# Patient Record
Sex: Female | Born: 1950
Health system: Southern US, Community
[De-identification: ages and names within clinical notes are randomized; demographics above are authoritative.]

## PROBLEM LIST (undated history)

## (undated) DIAGNOSIS — C9000 Multiple myeloma not having achieved remission: Secondary | ICD-10-CM

## (undated) DIAGNOSIS — K219 Gastro-esophageal reflux disease without esophagitis: Secondary | ICD-10-CM

## (undated) DIAGNOSIS — I10 Essential (primary) hypertension: Secondary | ICD-10-CM

## (undated) DIAGNOSIS — Z8489 Family history of other specified conditions: Secondary | ICD-10-CM

## (undated) DIAGNOSIS — Z9889 Other specified postprocedural states: Secondary | ICD-10-CM

## (undated) DIAGNOSIS — Z9484 Stem cells transplant status: Secondary | ICD-10-CM

## (undated) DIAGNOSIS — Z87898 Personal history of other specified conditions: Secondary | ICD-10-CM

## (undated) DIAGNOSIS — C541 Malignant neoplasm of endometrium: Secondary | ICD-10-CM

## (undated) DIAGNOSIS — Z9221 Personal history of antineoplastic chemotherapy: Secondary | ICD-10-CM

## (undated) DIAGNOSIS — Z923 Personal history of irradiation: Secondary | ICD-10-CM

## (undated) DIAGNOSIS — R112 Nausea with vomiting, unspecified: Secondary | ICD-10-CM

## (undated) DIAGNOSIS — M87059 Idiopathic aseptic necrosis of unspecified femur: Secondary | ICD-10-CM

## (undated) DIAGNOSIS — Z973 Presence of spectacles and contact lenses: Secondary | ICD-10-CM

## (undated) HISTORY — DX: Essential (primary) hypertension: I10

## (undated) HISTORY — DX: Personal history of irradiation: Z92.3

## (undated) HISTORY — DX: Multiple myeloma not having achieved remission: C90.00

---

## 1984-01-31 HISTORY — PX: TUBAL LIGATION: SHX77

## 1990-01-30 HISTORY — PX: ECTOPIC PREGNANCY SURGERY: SHX613

## 1997-05-31 ENCOUNTER — Emergency Department (HOSPITAL_COMMUNITY): Admission: EM | Admit: 1997-05-31 | Discharge: 1997-05-31 | Payer: Self-pay | Admitting: Emergency Medicine

## 1997-08-14 ENCOUNTER — Ambulatory Visit: Admission: RE | Admit: 1997-08-14 | Discharge: 1997-08-14 | Payer: Self-pay | Admitting: Obstetrics and Gynecology

## 1997-08-17 ENCOUNTER — Ambulatory Visit (HOSPITAL_COMMUNITY): Admission: RE | Admit: 1997-08-17 | Discharge: 1997-08-17 | Payer: Self-pay | Admitting: Obstetrics and Gynecology

## 1997-08-24 ENCOUNTER — Ambulatory Visit (HOSPITAL_COMMUNITY): Admission: RE | Admit: 1997-08-24 | Discharge: 1997-08-24 | Payer: Self-pay | Admitting: Hematology & Oncology

## 1997-09-07 ENCOUNTER — Ambulatory Visit (HOSPITAL_COMMUNITY): Admission: RE | Admit: 1997-09-07 | Discharge: 1997-09-07 | Payer: Self-pay | Admitting: Hematology & Oncology

## 1997-10-20 ENCOUNTER — Encounter: Payer: Self-pay | Admitting: Oncology

## 1997-10-20 ENCOUNTER — Inpatient Hospital Stay (HOSPITAL_COMMUNITY): Admission: AD | Admit: 1997-10-20 | Discharge: 1997-10-24 | Payer: Self-pay | Admitting: Oncology

## 1997-11-27 ENCOUNTER — Emergency Department (HOSPITAL_COMMUNITY): Admission: EM | Admit: 1997-11-27 | Discharge: 1997-11-27 | Payer: Self-pay | Admitting: Emergency Medicine

## 1997-11-28 ENCOUNTER — Inpatient Hospital Stay (HOSPITAL_COMMUNITY): Admission: AD | Admit: 1997-11-28 | Discharge: 1997-12-02 | Payer: Self-pay | Admitting: Oncology

## 1997-11-28 DIAGNOSIS — I10 Essential (primary) hypertension: Secondary | ICD-10-CM | POA: Insufficient documentation

## 1997-11-28 DIAGNOSIS — B9561 Methicillin susceptible Staphylococcus aureus infection as the cause of diseases classified elsewhere: Secondary | ICD-10-CM | POA: Insufficient documentation

## 1997-11-28 DIAGNOSIS — Z8579 Personal history of other malignant neoplasms of lymphoid, hematopoietic and related tissues: Secondary | ICD-10-CM | POA: Insufficient documentation

## 1997-11-28 DIAGNOSIS — B957 Other staphylococcus as the cause of diseases classified elsewhere: Secondary | ICD-10-CM | POA: Insufficient documentation

## 1997-11-28 HISTORY — DX: Essential (primary) hypertension: I10

## 1997-12-09 ENCOUNTER — Ambulatory Visit (HOSPITAL_COMMUNITY): Admission: RE | Admit: 1997-12-09 | Discharge: 1997-12-09 | Payer: Self-pay | Admitting: Hematology & Oncology

## 1998-01-12 ENCOUNTER — Ambulatory Visit (HOSPITAL_COMMUNITY): Admission: RE | Admit: 1998-01-12 | Discharge: 1998-01-12 | Payer: Self-pay | Admitting: Hematology & Oncology

## 1998-01-15 ENCOUNTER — Encounter: Payer: Self-pay | Admitting: Hematology & Oncology

## 1998-01-15 ENCOUNTER — Ambulatory Visit (HOSPITAL_COMMUNITY): Admission: RE | Admit: 1998-01-15 | Discharge: 1998-01-15 | Payer: Self-pay | Admitting: Hematology & Oncology

## 1998-01-18 ENCOUNTER — Ambulatory Visit: Admission: RE | Admit: 1998-01-18 | Discharge: 1998-01-18 | Payer: Self-pay | Admitting: Hematology & Oncology

## 1999-02-01 ENCOUNTER — Ambulatory Visit (HOSPITAL_COMMUNITY): Admission: RE | Admit: 1999-02-01 | Discharge: 1999-02-01 | Payer: Self-pay | Admitting: Internal Medicine

## 1999-02-01 ENCOUNTER — Encounter: Payer: Self-pay | Admitting: Internal Medicine

## 1999-10-06 ENCOUNTER — Other Ambulatory Visit: Admission: RE | Admit: 1999-10-06 | Discharge: 1999-10-06 | Payer: Self-pay | Admitting: Obstetrics and Gynecology

## 1999-10-14 ENCOUNTER — Ambulatory Visit (HOSPITAL_COMMUNITY): Admission: RE | Admit: 1999-10-14 | Discharge: 1999-10-14 | Payer: Self-pay | Admitting: Obstetrics and Gynecology

## 1999-10-14 ENCOUNTER — Encounter: Payer: Self-pay | Admitting: Obstetrics and Gynecology

## 2000-01-02 ENCOUNTER — Ambulatory Visit (HOSPITAL_COMMUNITY): Admission: RE | Admit: 2000-01-02 | Discharge: 2000-01-02 | Payer: Self-pay | Admitting: Gastroenterology

## 2001-02-01 ENCOUNTER — Other Ambulatory Visit: Admission: RE | Admit: 2001-02-01 | Discharge: 2001-02-01 | Payer: Self-pay | Admitting: Obstetrics and Gynecology

## 2001-02-15 ENCOUNTER — Encounter: Payer: Self-pay | Admitting: Obstetrics and Gynecology

## 2001-02-15 ENCOUNTER — Ambulatory Visit (HOSPITAL_COMMUNITY): Admission: RE | Admit: 2001-02-15 | Discharge: 2001-02-15 | Payer: Self-pay | Admitting: Obstetrics and Gynecology

## 2001-05-30 ENCOUNTER — Ambulatory Visit (HOSPITAL_COMMUNITY): Admission: RE | Admit: 2001-05-30 | Discharge: 2001-05-30 | Payer: Self-pay | Admitting: Hematology & Oncology

## 2001-05-30 ENCOUNTER — Encounter: Payer: Self-pay | Admitting: Hematology & Oncology

## 2003-11-13 ENCOUNTER — Ambulatory Visit: Payer: Self-pay | Admitting: Infectious Diseases

## 2003-11-17 ENCOUNTER — Ambulatory Visit (HOSPITAL_COMMUNITY): Admission: RE | Admit: 2003-11-17 | Discharge: 2003-11-17 | Payer: Self-pay | Admitting: Infectious Diseases

## 2003-12-14 ENCOUNTER — Ambulatory Visit: Payer: Self-pay | Admitting: Infectious Diseases

## 2004-01-13 ENCOUNTER — Ambulatory Visit: Payer: Self-pay | Admitting: Infectious Diseases

## 2004-02-17 ENCOUNTER — Ambulatory Visit: Payer: Self-pay | Admitting: Infectious Diseases

## 2004-03-30 ENCOUNTER — Ambulatory Visit: Payer: Self-pay | Admitting: Infectious Diseases

## 2004-03-31 ENCOUNTER — Ambulatory Visit: Payer: Self-pay | Admitting: Hematology & Oncology

## 2004-09-27 ENCOUNTER — Ambulatory Visit: Payer: Self-pay | Admitting: Hematology & Oncology

## 2005-04-10 ENCOUNTER — Ambulatory Visit: Payer: Self-pay | Admitting: Hematology & Oncology

## 2005-04-25 ENCOUNTER — Ambulatory Visit (HOSPITAL_COMMUNITY): Admission: RE | Admit: 2005-04-25 | Discharge: 2005-04-25 | Payer: Self-pay | Admitting: Hematology & Oncology

## 2005-04-25 ENCOUNTER — Encounter (INDEPENDENT_AMBULATORY_CARE_PROVIDER_SITE_OTHER): Payer: Self-pay | Admitting: Specialist

## 2005-06-06 ENCOUNTER — Ambulatory Visit: Payer: Self-pay | Admitting: Hematology & Oncology

## 2005-06-08 LAB — CBC WITH DIFFERENTIAL/PLATELET
BASO%: 1.7 % (ref 0.0–2.0)
Basophils Absolute: 0.1 10*3/uL (ref 0.0–0.1)
EOS%: 3.8 % (ref 0.0–7.0)
Eosinophils Absolute: 0.2 10*3/uL (ref 0.0–0.5)
HCT: 35.3 % (ref 34.8–46.6)
HGB: 11.4 g/dL — ABNORMAL LOW (ref 11.6–15.9)
LYMPH%: 41.4 % (ref 14.0–48.0)
MCH: 24.7 pg — ABNORMAL LOW (ref 26.0–34.0)
MCHC: 32.3 g/dL (ref 32.0–36.0)
MCV: 76.3 fL — ABNORMAL LOW (ref 81.0–101.0)
MONO#: 0.6 10*3/uL (ref 0.1–0.9)
MONO%: 11.8 % (ref 0.0–13.0)
NEUT#: 2.1 10*3/uL (ref 1.5–6.5)
NEUT%: 41.3 % (ref 39.6–76.8)
Platelets: 360 10*3/uL (ref 145–400)
RBC: 4.63 10*6/uL (ref 3.70–5.32)
RDW: 16 % — ABNORMAL HIGH (ref 11.3–14.5)
WBC: 5.1 10*3/uL (ref 3.9–10.0)
lymph#: 2.1 10*3/uL (ref 0.9–3.3)

## 2005-06-12 LAB — PROTEIN ELECTROPHORESIS, SERUM
Albumin ELP: 63 % (ref 55.8–66.1)
Alpha-1-Globulin: 3.8 % (ref 2.9–4.9)
Alpha-2-Globulin: 11.3 % (ref 7.1–11.8)
Beta 2: 5.5 % (ref 3.2–6.5)
Beta Globulin: 6.2 % (ref 4.7–7.2)
Gamma Globulin: 10.2 % — ABNORMAL LOW (ref 11.1–18.8)
Total Protein, Serum Electrophoresis: 7.1 g/dL (ref 6.0–8.3)

## 2005-06-12 LAB — KAPPA/LAMBDA LIGHT CHAINS
Kappa free light chain: 0.87 mg/dL (ref 0.33–1.94)
Kappa:Lambda Ratio: 0.04 — ABNORMAL LOW (ref 0.26–1.65)
Lambda Free Lght Chn: 23.4 mg/dL — ABNORMAL HIGH (ref 0.57–2.63)

## 2005-06-12 LAB — COMPREHENSIVE METABOLIC PANEL
ALT: 17 U/L (ref 0–40)
AST: 16 U/L (ref 0–37)
Albumin: 4.4 g/dL (ref 3.5–5.2)
Alkaline Phosphatase: 70 U/L (ref 39–117)
BUN: 23 mg/dL (ref 6–23)
CO2: 25 mEq/L (ref 19–32)
Calcium: 9.5 mg/dL (ref 8.4–10.5)
Chloride: 103 mEq/L (ref 96–112)
Creatinine, Ser: 1.1 mg/dL (ref 0.4–1.2)
Glucose, Bld: 98 mg/dL (ref 70–99)
Potassium: 3.9 mEq/L (ref 3.5–5.3)
Sodium: 140 mEq/L (ref 135–145)
Total Bilirubin: 0.3 mg/dL (ref 0.3–1.2)
Total Protein: 7.1 g/dL (ref 6.0–8.3)

## 2005-06-12 LAB — LACTATE DEHYDROGENASE: LDH: 153 U/L (ref 94–250)

## 2005-07-17 ENCOUNTER — Ambulatory Visit: Payer: Self-pay | Admitting: Hematology & Oncology

## 2005-07-20 LAB — UIFE/LIGHT CHAINS/TP QN, 24-HR UR
Albumin, U: DETECTED
Alpha 1, Urine: DETECTED — AB
Alpha 2, Urine: DETECTED — AB
Beta, Urine: DETECTED — AB
Free Kappa Lt Chains,Ur: 3.49 mg/dL — ABNORMAL HIGH (ref 0.04–1.51)
Free Kappa/Lambda Ratio: 0.02 ratio — ABNORMAL LOW (ref 0.46–4.00)
Free Lambda Excretion/Day: 2996 mg/d
Free Lambda Lt Chains,Ur: 214 mg/dL — ABNORMAL HIGH (ref 0.08–1.01)
Free Lt Chn Excr Rate: 48.86 mg/d
Gamma Globulin, Urine: DETECTED — AB
Time: 24 hours
Total Protein, Urine-Ur/day: 3070 mg/d — ABNORMAL HIGH (ref 10–140)
Total Protein, Urine: 219.3 mg/dL
Volume, Urine: 1400 mL

## 2005-07-24 LAB — CBC WITH DIFFERENTIAL/PLATELET
BASO%: 3 % — ABNORMAL HIGH (ref 0.0–2.0)
Basophils Absolute: 0.2 10*3/uL — ABNORMAL HIGH (ref 0.0–0.1)
EOS%: 4.9 % (ref 0.0–7.0)
Eosinophils Absolute: 0.3 10*3/uL (ref 0.0–0.5)
HCT: 36.7 % (ref 34.8–46.6)
HGB: 11.9 g/dL (ref 11.6–15.9)
LYMPH%: 40.1 % (ref 14.0–48.0)
MCH: 24.9 pg — ABNORMAL LOW (ref 26.0–34.0)
MCHC: 32.6 g/dL (ref 32.0–36.0)
MCV: 76.4 fL — ABNORMAL LOW (ref 81.0–101.0)
MONO#: 0.7 10*3/uL (ref 0.1–0.9)
MONO%: 13 % (ref 0.0–13.0)
NEUT#: 2.2 10*3/uL (ref 1.5–6.5)
NEUT%: 39 % — ABNORMAL LOW (ref 39.6–76.8)
Platelets: 402 10*3/uL — ABNORMAL HIGH (ref 145–400)
RBC: 4.8 10*6/uL (ref 3.70–5.32)
RDW: 17.4 % — ABNORMAL HIGH (ref 11.3–14.5)
WBC: 5.5 10*3/uL (ref 3.9–10.0)
lymph#: 2.2 10*3/uL (ref 0.9–3.3)

## 2005-07-26 LAB — COMPREHENSIVE METABOLIC PANEL
ALT: 24 U/L (ref 0–40)
AST: 18 U/L (ref 0–37)
Albumin: 4.8 g/dL (ref 3.5–5.2)
Alkaline Phosphatase: 69 U/L (ref 39–117)
BUN: 18 mg/dL (ref 6–23)
CO2: 27 mEq/L (ref 19–32)
Calcium: 9.6 mg/dL (ref 8.4–10.5)
Chloride: 102 mEq/L (ref 96–112)
Creatinine, Ser: 1 mg/dL (ref 0.40–1.20)
Glucose, Bld: 98 mg/dL (ref 70–99)
Potassium: 4.2 mEq/L (ref 3.5–5.3)
Sodium: 139 mEq/L (ref 135–145)
Total Bilirubin: 0.3 mg/dL (ref 0.3–1.2)
Total Protein: 7.6 g/dL (ref 6.0–8.3)

## 2005-07-26 LAB — PROTEIN ELECTROPHORESIS, SERUM
Albumin ELP: 59.6 % (ref 55.8–66.1)
Alpha-1-Globulin: 4 % (ref 2.9–4.9)
Alpha-2-Globulin: 11.8 % (ref 7.1–11.8)
Beta 2: 6.2 % (ref 3.2–6.5)
Beta Globulin: 6.8 % (ref 4.7–7.2)
Gamma Globulin: 11.6 % (ref 11.1–18.8)
Total Protein, Serum Electrophoresis: 7.6 g/dL (ref 6.0–8.3)

## 2005-07-26 LAB — BETA 2 MICROGLOBULIN, SERUM: Beta-2 Microglobulin: 2.02 mg/L — ABNORMAL HIGH (ref 1.01–1.73)

## 2005-08-15 LAB — URINALYSIS, MICROSCOPIC - CHCC
Bilirubin (Urine): NEGATIVE
Blood: NEGATIVE
Glucose: NEGATIVE g/dL
Ketones: NEGATIVE mg/dL
Nitrite: NEGATIVE
Protein: NEGATIVE mg/dL
RBC count: NEGATIVE (ref 0–2)
Specific Gravity, Urine: 1.03 (ref 1.003–1.035)
pH: 5 (ref 4.6–8.0)

## 2005-08-17 LAB — URINE CULTURE

## 2005-08-18 LAB — URINALYSIS, MICROSCOPIC - CHCC
Bilirubin (Urine): NEGATIVE
Glucose: NEGATIVE g/dL
Ketones: NEGATIVE mg/dL
Leukocyte Esterase: NEGATIVE
Nitrite: NEGATIVE
Protein: NEGATIVE mg/dL
Specific Gravity, Urine: 1.02 (ref 1.003–1.035)
WBC, UA: NEGATIVE (ref 0–2)
pH: 6 (ref 4.6–8.0)

## 2005-08-18 LAB — COMPREHENSIVE METABOLIC PANEL
ALT: 20 U/L (ref 0–40)
AST: 11 U/L (ref 0–37)
Albumin: 4.2 g/dL (ref 3.5–5.2)
Alkaline Phosphatase: 70 U/L (ref 39–117)
BUN: 18 mg/dL (ref 6–23)
CO2: 23 mEq/L (ref 19–32)
Calcium: 8.8 mg/dL (ref 8.4–10.5)
Chloride: 103 mEq/L (ref 96–112)
Creatinine, Ser: 0.93 mg/dL (ref 0.40–1.20)
Glucose, Bld: 87 mg/dL (ref 70–99)
Potassium: 3.8 mEq/L (ref 3.5–5.3)
Sodium: 139 mEq/L (ref 135–145)
Total Bilirubin: 0.3 mg/dL (ref 0.3–1.2)
Total Protein: 6.4 g/dL (ref 6.0–8.3)

## 2005-08-18 LAB — CBC WITH DIFFERENTIAL/PLATELET
BASO%: 0.4 % (ref 0.0–2.0)
Basophils Absolute: 0 10*3/uL (ref 0.0–0.1)
EOS%: 4.6 % (ref 0.0–7.0)
Eosinophils Absolute: 0.2 10*3/uL (ref 0.0–0.5)
HCT: 34.2 % — ABNORMAL LOW (ref 34.8–46.6)
HGB: 11.2 g/dL — ABNORMAL LOW (ref 11.6–15.9)
LYMPH%: 29.9 % (ref 14.0–48.0)
MCH: 25.6 pg — ABNORMAL LOW (ref 26.0–34.0)
MCHC: 32.9 g/dL (ref 32.0–36.0)
MCV: 77.9 fL — ABNORMAL LOW (ref 81.0–101.0)
MONO#: 1 10*3/uL — ABNORMAL HIGH (ref 0.1–0.9)
MONO%: 18 % — ABNORMAL HIGH (ref 0.0–13.0)
NEUT#: 2.6 10*3/uL (ref 1.5–6.5)
NEUT%: 47.1 % (ref 39.6–76.8)
Platelets: 314 10*3/uL (ref 145–400)
RBC: 4.39 10*6/uL (ref 3.70–5.32)
RDW: 21.2 % — ABNORMAL HIGH (ref 11.3–14.5)
WBC: 5.4 10*3/uL (ref 3.9–10.0)
lymph#: 1.6 10*3/uL (ref 0.9–3.3)

## 2005-09-15 ENCOUNTER — Ambulatory Visit: Payer: Self-pay | Admitting: Hematology & Oncology

## 2005-09-21 LAB — UIFE/LIGHT CHAINS/TP QN, 24-HR UR
Albumin, U: DETECTED
Alpha 1, Urine: DETECTED — AB
Alpha 2, Urine: DETECTED — AB
Beta, Urine: DETECTED — AB
Free Kappa Lt Chains,Ur: 1.99 mg/dL — ABNORMAL HIGH (ref 0.04–1.51)
Free Kappa/Lambda Ratio: 0.25 ratio — ABNORMAL LOW (ref 0.46–4.00)
Free Lambda Excretion/Day: 103.09 mg/d
Free Lambda Lt Chains,Ur: 7.93 mg/dL — ABNORMAL HIGH (ref 0.08–1.01)
Free Lt Chn Excr Rate: 25.87 mg/d
Gamma Globulin, Urine: DETECTED — AB
Time: 24 hours
Total Protein, Urine-Ur/day: 143 mg/d — ABNORMAL HIGH (ref 10–140)
Total Protein, Urine: 11 mg/dL
Volume, Urine: 1300 mL

## 2005-09-29 LAB — CBC WITH DIFFERENTIAL/PLATELET
BASO%: 0.1 % (ref 0.0–2.0)
Basophils Absolute: 0 10*3/uL (ref 0.0–0.1)
EOS%: 3.2 % (ref 0.0–7.0)
Eosinophils Absolute: 0.2 10*3/uL (ref 0.0–0.5)
HCT: 34 % — ABNORMAL LOW (ref 34.8–46.6)
HGB: 11.2 g/dL — ABNORMAL LOW (ref 11.6–15.9)
LYMPH%: 22.8 % (ref 14.0–48.0)
MCH: 26.7 pg (ref 26.0–34.0)
MCHC: 33.1 g/dL (ref 32.0–36.0)
MCV: 80.7 fL — ABNORMAL LOW (ref 81.0–101.0)
MONO#: 0.9 10*3/uL (ref 0.1–0.9)
MONO%: 16.5 % — ABNORMAL HIGH (ref 0.0–13.0)
NEUT#: 3.2 10*3/uL (ref 1.5–6.5)
NEUT%: 57.4 % (ref 39.6–76.8)
Platelets: 309 10*3/uL (ref 145–400)
RBC: 4.21 10*6/uL (ref 3.70–5.32)
RDW: 23 % — ABNORMAL HIGH (ref 11.3–14.5)
WBC: 5.6 10*3/uL (ref 3.9–10.0)
lymph#: 1.3 10*3/uL (ref 0.9–3.3)

## 2005-10-02 LAB — COMPREHENSIVE METABOLIC PANEL
ALT: 18 U/L (ref 0–40)
AST: 10 U/L (ref 0–37)
Albumin: 4.2 g/dL (ref 3.5–5.2)
Alkaline Phosphatase: 42 U/L (ref 39–117)
BUN: 26 mg/dL — ABNORMAL HIGH (ref 6–23)
CO2: 26 mEq/L (ref 19–32)
Calcium: 9.7 mg/dL (ref 8.4–10.5)
Chloride: 106 mEq/L (ref 96–112)
Creatinine, Ser: 0.89 mg/dL (ref 0.40–1.20)
Glucose, Bld: 92 mg/dL (ref 70–99)
Potassium: 3.7 mEq/L (ref 3.5–5.3)
Sodium: 143 mEq/L (ref 135–145)
Total Bilirubin: 0.4 mg/dL (ref 0.3–1.2)
Total Protein: 6.4 g/dL (ref 6.0–8.3)

## 2005-10-02 LAB — KAPPA/LAMBDA LIGHT CHAINS
Kappa free light chain: 0.92 mg/dL (ref 0.33–1.94)
Kappa:Lambda Ratio: 0.56 (ref 0.26–1.65)
Lambda Free Lght Chn: 1.63 mg/dL (ref 0.57–2.63)

## 2005-10-02 LAB — LACTATE DEHYDROGENASE: LDH: 192 U/L (ref 94–250)

## 2005-11-20 ENCOUNTER — Ambulatory Visit: Payer: Self-pay | Admitting: Hematology & Oncology

## 2005-12-18 ENCOUNTER — Other Ambulatory Visit: Admission: RE | Admit: 2005-12-18 | Discharge: 2005-12-18 | Payer: Self-pay | Admitting: Obstetrics and Gynecology

## 2005-12-18 DIAGNOSIS — R638 Other symptoms and signs concerning food and fluid intake: Secondary | ICD-10-CM | POA: Insufficient documentation

## 2006-01-16 ENCOUNTER — Ambulatory Visit: Payer: Self-pay | Admitting: Hematology & Oncology

## 2006-02-01 LAB — UIFE/LIGHT CHAINS/TP QN, 24-HR UR
Albumin, U: DETECTED
Alpha 1, Urine: DETECTED — AB
Alpha 2, Urine: DETECTED — AB
Beta, Urine: DETECTED — AB
Free Kappa Lt Chains,Ur: 1.89 mg/dL — ABNORMAL HIGH (ref 0.04–1.51)
Free Kappa/Lambda Ratio: 1.31 ratio (ref 0.46–4.00)
Free Lambda Excretion/Day: 23.04 mg/d
Free Lambda Lt Chains,Ur: 1.44 mg/dL — ABNORMAL HIGH (ref 0.08–1.01)
Free Lt Chn Excr Rate: 30.24 mg/d
Gamma Globulin, Urine: DETECTED — AB
Time: 24 hours
Total Protein, Urine-Ur/day: 69 mg/d (ref 10–140)
Total Protein, Urine: 4.3 mg/dL
Volume, Urine: 1600 mL

## 2006-02-01 LAB — CBC WITH DIFFERENTIAL/PLATELET
BASO%: 0.9 % (ref 0.0–2.0)
Basophils Absolute: 0.1 10*3/uL (ref 0.0–0.1)
EOS%: 1.9 % (ref 0.0–7.0)
Eosinophils Absolute: 0.1 10*3/uL (ref 0.0–0.5)
HCT: 35.7 % (ref 34.8–46.6)
HGB: 11.9 g/dL (ref 11.6–15.9)
LYMPH%: 24.4 % (ref 14.0–48.0)
MCH: 27.4 pg (ref 26.0–34.0)
MCHC: 33.2 g/dL (ref 32.0–36.0)
MCV: 82.7 fL (ref 81.0–101.0)
MONO#: 0.6 10*3/uL (ref 0.1–0.9)
MONO%: 9.2 % (ref 0.0–13.0)
NEUT#: 4.1 10*3/uL (ref 1.5–6.5)
NEUT%: 63.6 % (ref 39.6–76.8)
Platelets: 305 10*3/uL (ref 145–400)
RBC: 4.32 10*6/uL (ref 3.70–5.32)
RDW: 16.5 % — ABNORMAL HIGH (ref 11.3–14.5)
WBC: 6.4 10*3/uL (ref 3.9–10.0)
lymph#: 1.6 10*3/uL (ref 0.9–3.3)

## 2006-02-05 ENCOUNTER — Ambulatory Visit: Payer: Self-pay | Admitting: Hematology & Oncology

## 2006-02-05 LAB — COMPREHENSIVE METABOLIC PANEL
ALT: 21 U/L (ref 0–35)
AST: 12 U/L (ref 0–37)
Albumin: 4.4 g/dL (ref 3.5–5.2)
Alkaline Phosphatase: 40 U/L (ref 39–117)
BUN: 20 mg/dL (ref 6–23)
CO2: 28 mEq/L (ref 19–32)
Calcium: 9.8 mg/dL (ref 8.4–10.5)
Chloride: 99 mEq/L (ref 96–112)
Creatinine, Ser: 1.26 mg/dL — ABNORMAL HIGH (ref 0.40–1.20)
Glucose, Bld: 91 mg/dL (ref 70–99)
Potassium: 3.1 mEq/L — ABNORMAL LOW (ref 3.5–5.3)
Sodium: 143 mEq/L (ref 135–145)
Total Bilirubin: 0.4 mg/dL (ref 0.3–1.2)
Total Protein: 6.5 g/dL (ref 6.0–8.3)

## 2006-02-05 LAB — PROTEIN ELECTROPHORESIS, SERUM
Albumin ELP: 62.6 % (ref 55.8–66.1)
Alpha-1-Globulin: 3.8 % (ref 2.9–4.9)
Alpha-2-Globulin: 12.5 % — ABNORMAL HIGH (ref 7.1–11.8)
Beta 2: 5.1 % (ref 3.2–6.5)
Beta Globulin: 6.9 % (ref 4.7–7.2)
Gamma Globulin: 9.1 % — ABNORMAL LOW (ref 11.1–18.8)
Total Protein, Serum Electrophoresis: 6.5 g/dL (ref 6.0–8.3)

## 2006-02-05 LAB — IGG, IGA, IGM
IgA: 111 mg/dL (ref 68–378)
IgG (Immunoglobin G), Serum: 591 mg/dL — ABNORMAL LOW (ref 694–1618)
IgM, Serum: 61 mg/dL (ref 60–263)

## 2006-02-05 LAB — BETA 2 MICROGLOBULIN, SERUM: Beta-2 Microglobulin: 1.48 mg/L (ref 1.01–1.73)

## 2006-03-21 ENCOUNTER — Ambulatory Visit: Payer: Self-pay | Admitting: Hematology & Oncology

## 2006-03-28 LAB — CBC WITH DIFFERENTIAL/PLATELET
BASO%: 0 % (ref 0.0–2.0)
Basophils Absolute: 0 10*3/uL (ref 0.0–0.1)
EOS%: 0 % (ref 0.0–7.0)
Eosinophils Absolute: 0 10*3/uL (ref 0.0–0.5)
HCT: 38.6 % (ref 34.8–46.6)
HGB: 12.9 g/dL (ref 11.6–15.9)
LYMPH%: 21.1 % (ref 14.0–48.0)
MCH: 27 pg (ref 26.0–34.0)
MCHC: 33.4 g/dL (ref 32.0–36.0)
MCV: 80.8 fL — ABNORMAL LOW (ref 81.0–101.0)
MONO#: 1.4 10*3/uL — ABNORMAL HIGH (ref 0.1–0.9)
MONO%: 18.3 % — ABNORMAL HIGH (ref 0.0–13.0)
NEUT#: 4.8 10*3/uL (ref 1.5–6.5)
NEUT%: 60.6 % (ref 39.6–76.8)
Platelets: 309 10*3/uL (ref 145–400)
RBC: 4.78 10*6/uL (ref 3.70–5.32)
RDW: 17.9 % — ABNORMAL HIGH (ref 11.3–14.5)
WBC: 7.9 10*3/uL (ref 3.9–10.0)
lymph#: 1.7 10*3/uL (ref 0.9–3.3)

## 2006-03-28 LAB — UIFE/LIGHT CHAINS/TP QN, 24-HR UR
Albumin, U: DETECTED
Free Kappa Lt Chains,Ur: 0.95 mg/dL (ref 0.04–1.51)
Free Kappa/Lambda Ratio: 2.5 ratio (ref 0.46–4.00)
Free Lambda Excretion/Day: 6.75 mg/d
Free Lambda Lt Chains,Ur: 0.38 mg/dL (ref 0.08–1.01)
Free Lt Chn Excr Rate: 16.86 mg/d
Gamma Globulin, Urine: DETECTED — AB
Time: 24 hours
Total Protein, Urine-Ur/day: 48 mg/d (ref 10–140)
Total Protein, Urine: 2.7 mg/dL
Volume, Urine: 1775 mL

## 2006-03-30 LAB — COMPREHENSIVE METABOLIC PANEL
ALT: 20 U/L (ref 0–35)
AST: 13 U/L (ref 0–37)
Albumin: 4.3 g/dL (ref 3.5–5.2)
Alkaline Phosphatase: 37 U/L — ABNORMAL LOW (ref 39–117)
BUN: 21 mg/dL (ref 6–23)
CO2: 25 mEq/L (ref 19–32)
Calcium: 9.5 mg/dL (ref 8.4–10.5)
Chloride: 99 mEq/L (ref 96–112)
Creatinine, Ser: 1.12 mg/dL (ref 0.40–1.20)
Glucose, Bld: 93 mg/dL (ref 70–99)
Potassium: 3.3 mEq/L — ABNORMAL LOW (ref 3.5–5.3)
Sodium: 138 mEq/L (ref 135–145)
Total Bilirubin: 0.3 mg/dL (ref 0.3–1.2)
Total Protein: 6.5 g/dL (ref 6.0–8.3)

## 2006-03-30 LAB — BETA 2 MICROGLOBULIN, SERUM: Beta-2 Microglobulin: 1.79 mg/L — ABNORMAL HIGH (ref 1.01–1.73)

## 2006-03-30 LAB — KAPPA/LAMBDA LIGHT CHAINS
Kappa free light chain: 0.87 mg/dL (ref 0.33–1.94)
Kappa:Lambda Ratio: 0.83 (ref 0.26–1.65)
Lambda Free Lght Chn: 1.05 mg/dL (ref 0.57–2.63)

## 2006-05-15 ENCOUNTER — Ambulatory Visit: Payer: Self-pay | Admitting: Hematology & Oncology

## 2006-05-25 LAB — CBC WITH DIFFERENTIAL/PLATELET
BASO%: 2.1 % — ABNORMAL HIGH (ref 0.0–2.0)
Basophils Absolute: 0.2 10*3/uL — ABNORMAL HIGH (ref 0.0–0.1)
EOS%: 3.4 % (ref 0.0–7.0)
Eosinophils Absolute: 0.3 10*3/uL (ref 0.0–0.5)
HCT: 38.1 % (ref 34.8–46.6)
HGB: 12.5 g/dL (ref 11.6–15.9)
LYMPH%: 31.1 % (ref 14.0–48.0)
MCH: 26.7 pg (ref 26.0–34.0)
MCHC: 32.7 g/dL (ref 32.0–36.0)
MCV: 81.6 fL (ref 81.0–101.0)
MONO#: 1 10*3/uL — ABNORMAL HIGH (ref 0.1–0.9)
MONO%: 12.6 % (ref 0.0–13.0)
NEUT#: 4.1 10*3/uL (ref 1.5–6.5)
NEUT%: 50.9 % (ref 39.6–76.8)
Platelets: 322 10*3/uL (ref 145–400)
RBC: 4.67 10*6/uL (ref 3.70–5.32)
RDW: 13.5 % (ref 11.3–14.5)
WBC: 8.1 10*3/uL (ref 3.9–10.0)
lymph#: 2.5 10*3/uL (ref 0.9–3.3)

## 2006-05-28 LAB — COMPREHENSIVE METABOLIC PANEL
ALT: 20 U/L (ref 0–35)
AST: 12 U/L (ref 0–37)
Albumin: 4.3 g/dL (ref 3.5–5.2)
Alkaline Phosphatase: 33 U/L — ABNORMAL LOW (ref 39–117)
BUN: 22 mg/dL (ref 6–23)
CO2: 27 mEq/L (ref 19–32)
Calcium: 9.6 mg/dL (ref 8.4–10.5)
Chloride: 102 mEq/L (ref 96–112)
Creatinine, Ser: 1.1 mg/dL (ref 0.40–1.20)
Glucose, Bld: 89 mg/dL (ref 70–99)
Potassium: 3 mEq/L — ABNORMAL LOW (ref 3.5–5.3)
Sodium: 141 mEq/L (ref 135–145)
Total Bilirubin: 0.4 mg/dL (ref 0.3–1.2)
Total Protein: 6.6 g/dL (ref 6.0–8.3)

## 2006-05-28 LAB — IGG, IGA, IGM
IgA: 106 mg/dL (ref 68–378)
IgG (Immunoglobin G), Serum: 564 mg/dL — ABNORMAL LOW (ref 694–1618)
IgM, Serum: 58 mg/dL — ABNORMAL LOW (ref 60–263)

## 2006-05-28 LAB — BETA 2 MICROGLOBULIN, SERUM: Beta-2 Microglobulin: 1.41 mg/L (ref 1.01–1.73)

## 2006-05-30 LAB — UIFE/LIGHT CHAINS/TP QN, 24-HR UR
Albumin, U: DETECTED
Alpha 1, Urine: DETECTED — AB
Alpha 2, Urine: DETECTED — AB
Beta, Urine: DETECTED — AB
Free Kappa Lt Chains,Ur: 1.13 mg/dL (ref 0.04–1.51)
Free Kappa/Lambda Ratio: 2.31 ratio (ref 0.46–4.00)
Free Lambda Excretion/Day: 7.84 mg/d
Free Lambda Lt Chains,Ur: 0.49 mg/dL (ref 0.08–1.01)
Free Lt Chn Excr Rate: 18.08 mg/d
Gamma Globulin, Urine: DETECTED — AB
Time: 24 hours
Total Protein, Urine-Ur/day: 50 mg/d (ref 10–140)
Total Protein, Urine: 3.1 mg/dL
Volume, Urine: 1600 mL

## 2006-07-12 ENCOUNTER — Ambulatory Visit: Payer: Self-pay | Admitting: Hematology & Oncology

## 2006-07-27 LAB — COMPREHENSIVE METABOLIC PANEL
ALT: 18 U/L (ref 0–35)
AST: 12 U/L (ref 0–37)
Albumin: 4.4 g/dL (ref 3.5–5.2)
Alkaline Phosphatase: 37 U/L — ABNORMAL LOW (ref 39–117)
BUN: 26 mg/dL — ABNORMAL HIGH (ref 6–23)
CO2: 23 mEq/L (ref 19–32)
Calcium: 9.8 mg/dL (ref 8.4–10.5)
Chloride: 104 mEq/L (ref 96–112)
Creatinine, Ser: 1.02 mg/dL (ref 0.40–1.20)
Glucose, Bld: 102 mg/dL — ABNORMAL HIGH (ref 70–99)
Potassium: 3.3 mEq/L — ABNORMAL LOW (ref 3.5–5.3)
Sodium: 144 mEq/L (ref 135–145)
Total Bilirubin: 0.2 mg/dL — ABNORMAL LOW (ref 0.3–1.2)
Total Protein: 6.6 g/dL (ref 6.0–8.3)

## 2006-07-27 LAB — CBC WITH DIFFERENTIAL/PLATELET
BASO%: 0.5 % (ref 0.0–2.0)
Basophils Absolute: 0 10*3/uL (ref 0.0–0.1)
EOS%: 3.4 % (ref 0.0–7.0)
Eosinophils Absolute: 0.3 10*3/uL (ref 0.0–0.5)
HCT: 36.4 % (ref 34.8–46.6)
HGB: 12.1 g/dL (ref 11.6–15.9)
LYMPH%: 32.9 % (ref 14.0–48.0)
MCH: 26.6 pg (ref 26.0–34.0)
MCHC: 33.2 g/dL (ref 32.0–36.0)
MCV: 80.2 fL — ABNORMAL LOW (ref 81.0–101.0)
MONO#: 1 10*3/uL — ABNORMAL HIGH (ref 0.1–0.9)
MONO%: 12.8 % (ref 0.0–13.0)
NEUT#: 3.8 10*3/uL (ref 1.5–6.5)
NEUT%: 50.4 % (ref 39.6–76.8)
Platelets: 317 10*3/uL (ref 145–400)
RBC: 4.54 10*6/uL (ref 3.70–5.32)
RDW: 16.2 % — ABNORMAL HIGH (ref 11.3–14.5)
WBC: 7.5 10*3/uL (ref 3.9–10.0)
lymph#: 2.5 10*3/uL (ref 0.9–3.3)

## 2006-07-27 LAB — IGG, IGA, IGM
IgA: 103 mg/dL (ref 68–378)
IgG (Immunoglobin G), Serum: 591 mg/dL — ABNORMAL LOW (ref 694–1618)
IgM, Serum: 49 mg/dL — ABNORMAL LOW (ref 60–263)

## 2006-07-27 LAB — BETA 2 MICROGLOBULIN, SERUM: Beta-2 Microglobulin: 1.13 mg/L (ref 1.01–1.73)

## 2006-08-01 LAB — UIFE/LIGHT CHAINS/TP QN, 24-HR UR
Albumin, U: DETECTED
Free Kappa Lt Chains,Ur: 0.5 mg/dL (ref 0.04–1.51)
Free Kappa/Lambda Ratio: 2.27 ratio (ref 0.46–4.00)
Free Lambda Excretion/Day: 4.84 mg/d
Free Lambda Lt Chains,Ur: 0.22 mg/dL (ref 0.08–1.01)
Free Lt Chn Excr Rate: 11 mg/d
Time: 24 hours
Total Protein, Urine-Ur/day: 26 mg/d (ref 10–140)
Total Protein, Urine: 1.2 mg/dL
Volume, Urine: 2200 mL

## 2006-08-22 ENCOUNTER — Ambulatory Visit: Payer: Self-pay | Admitting: Hematology & Oncology

## 2006-11-07 ENCOUNTER — Ambulatory Visit: Payer: Self-pay | Admitting: Hematology & Oncology

## 2006-11-09 LAB — CBC WITH DIFFERENTIAL/PLATELET
BASO%: 0.3 % (ref 0.0–2.0)
Basophils Absolute: 0 10*3/uL (ref 0.0–0.1)
EOS%: 1.3 % (ref 0.0–7.0)
Eosinophils Absolute: 0.1 10*3/uL (ref 0.0–0.5)
HCT: 35.8 % (ref 34.8–46.6)
HGB: 12.1 g/dL (ref 11.6–15.9)
LYMPH%: 30.5 % (ref 14.0–48.0)
MCH: 26.8 pg (ref 26.0–34.0)
MCHC: 33.7 g/dL (ref 32.0–36.0)
MCV: 79.6 fL — ABNORMAL LOW (ref 81.0–101.0)
MONO#: 1.1 10*3/uL — ABNORMAL HIGH (ref 0.1–0.9)
MONO%: 12.9 % (ref 0.0–13.0)
NEUT#: 4.5 10*3/uL (ref 1.5–6.5)
NEUT%: 55 % (ref 39.6–76.8)
Platelets: 346 10*3/uL (ref 145–400)
RBC: 4.5 10*6/uL (ref 3.70–5.32)
RDW: 15.9 % — ABNORMAL HIGH (ref 11.3–14.5)
WBC: 8.2 10*3/uL (ref 3.9–10.0)
lymph#: 2.5 10*3/uL (ref 0.9–3.3)

## 2006-11-13 LAB — COMPREHENSIVE METABOLIC PANEL
ALT: 18 U/L (ref 0–35)
AST: 11 U/L (ref 0–37)
Albumin: 4.2 g/dL (ref 3.5–5.2)
Alkaline Phosphatase: 35 U/L — ABNORMAL LOW (ref 39–117)
BUN: 26 mg/dL — ABNORMAL HIGH (ref 6–23)
CO2: 26 mEq/L (ref 19–32)
Calcium: 9.6 mg/dL (ref 8.4–10.5)
Chloride: 103 mEq/L (ref 96–112)
Creatinine, Ser: 1.19 mg/dL (ref 0.40–1.20)
Glucose, Bld: 90 mg/dL (ref 70–99)
Potassium: 3.3 mEq/L — ABNORMAL LOW (ref 3.5–5.3)
Sodium: 141 mEq/L (ref 135–145)
Total Bilirubin: 0.3 mg/dL (ref 0.3–1.2)
Total Protein: 6.4 g/dL (ref 6.0–8.3)

## 2006-11-13 LAB — PROTEIN ELECTROPHORESIS, SERUM
Albumin ELP: 63.5 % (ref 55.8–66.1)
Alpha-1-Globulin: 3.6 % (ref 2.9–4.9)
Alpha-2-Globulin: 11.8 % (ref 7.1–11.8)
Beta 2: 5 % (ref 3.2–6.5)
Beta Globulin: 7 % (ref 4.7–7.2)
Gamma Globulin: 9.1 % — ABNORMAL LOW (ref 11.1–18.8)
Total Protein, Serum Electrophoresis: 6.4 g/dL (ref 6.0–8.3)

## 2006-11-13 LAB — IGG, IGA, IGM
IgA: 119 mg/dL (ref 68–378)
IgG (Immunoglobin G), Serum: 592 mg/dL — ABNORMAL LOW (ref 694–1618)
IgM, Serum: 52 mg/dL — ABNORMAL LOW (ref 60–263)

## 2006-11-13 LAB — BETA 2 MICROGLOBULIN, SERUM: Beta-2 Microglobulin: 1.33 mg/L (ref 1.01–1.73)

## 2006-11-14 LAB — UIFE/LIGHT CHAINS/TP QN, 24-HR UR
Albumin, U: DETECTED
Free Kappa Lt Chains,Ur: 1.42 mg/dL (ref 0.04–1.51)
Free Kappa/Lambda Ratio: 1.14 ratio (ref 0.46–4.00)
Free Lambda Excretion/Day: 22.19 mg/d
Free Lambda Lt Chains,Ur: 1.25 mg/dL — ABNORMAL HIGH (ref 0.08–1.01)
Free Lt Chn Excr Rate: 25.21 mg/d
Time: 24 hours
Total Protein, Urine-Ur/day: 62 mg/d (ref 10–140)
Total Protein, Urine: 3.5 mg/dL
Volume, Urine: 1775 mL

## 2006-12-19 DIAGNOSIS — N952 Postmenopausal atrophic vaginitis: Secondary | ICD-10-CM | POA: Insufficient documentation

## 2006-12-31 ENCOUNTER — Ambulatory Visit: Payer: Self-pay | Admitting: Hematology & Oncology

## 2007-01-02 LAB — CBC WITH DIFFERENTIAL/PLATELET
BASO%: 0.7 % (ref 0.0–2.0)
Basophils Absolute: 0 10*3/uL (ref 0.0–0.1)
EOS%: 2.3 % (ref 0.0–7.0)
Eosinophils Absolute: 0.1 10*3/uL (ref 0.0–0.5)
HCT: 36.4 % (ref 34.8–46.6)
HGB: 12.3 g/dL (ref 11.6–15.9)
LYMPH%: 26.8 % (ref 14.0–48.0)
MCH: 27 pg (ref 26.0–34.0)
MCHC: 33.8 g/dL (ref 32.0–36.0)
MCV: 80 fL — ABNORMAL LOW (ref 81.0–101.0)
MONO#: 0.7 10*3/uL (ref 0.1–0.9)
MONO%: 11.7 % (ref 0.0–13.0)
NEUT#: 3.7 10*3/uL (ref 1.5–6.5)
NEUT%: 58.5 % (ref 39.6–76.8)
Platelets: 301 10*3/uL (ref 145–400)
RBC: 4.55 10*6/uL (ref 3.70–5.32)
RDW: 15.9 % — ABNORMAL HIGH (ref 11.3–14.5)
WBC: 6.3 10*3/uL (ref 3.9–10.0)
lymph#: 1.7 10*3/uL (ref 0.9–3.3)

## 2007-01-07 LAB — PROTEIN ELECTROPHORESIS, SERUM
Albumin ELP: 63.6 % (ref 55.8–66.1)
Alpha-1-Globulin: 3.8 % (ref 2.9–4.9)
Alpha-2-Globulin: 10.7 % (ref 7.1–11.8)
Beta 2: 5.5 % (ref 3.2–6.5)
Beta Globulin: 7.5 % — ABNORMAL HIGH (ref 4.7–7.2)
Gamma Globulin: 8.9 % — ABNORMAL LOW (ref 11.1–18.8)
Total Protein, Serum Electrophoresis: 7.2 g/dL (ref 6.0–8.3)

## 2007-01-07 LAB — KAPPA/LAMBDA LIGHT CHAINS
Kappa free light chain: <0.54 mg/dL (ref 0.33–1.94)
Lambda Free Lght Chn: 2.2 mg/dL (ref 0.57–2.63)

## 2007-01-07 LAB — COMPREHENSIVE METABOLIC PANEL
ALT: 19 U/L (ref 0–35)
AST: 15 U/L (ref 0–37)
Albumin: 4.7 g/dL (ref 3.5–5.2)
Alkaline Phosphatase: 41 U/L (ref 39–117)
BUN: 24 mg/dL — ABNORMAL HIGH (ref 6–23)
CO2: 24 mEq/L (ref 19–32)
Calcium: 9.8 mg/dL (ref 8.4–10.5)
Chloride: 103 mEq/L (ref 96–112)
Creatinine, Ser: 0.93 mg/dL (ref 0.40–1.20)
Glucose, Bld: 104 mg/dL — ABNORMAL HIGH (ref 70–99)
Potassium: 3.2 mEq/L — ABNORMAL LOW (ref 3.5–5.3)
Sodium: 140 mEq/L (ref 135–145)
Total Bilirubin: 0.4 mg/dL (ref 0.3–1.2)
Total Protein: 7.2 g/dL (ref 6.0–8.3)

## 2007-01-07 LAB — BETA 2 MICROGLOBULIN, SERUM: Beta-2 Microglobulin: 1.47 mg/L (ref 1.01–1.73)

## 2007-01-17 LAB — UIFE/LIGHT CHAINS/TP QN, 24-HR UR
Albumin, U: DETECTED
Free Kappa Lt Chains,Ur: 0.58 mg/dL (ref 0.04–1.51)
Free Kappa/Lambda Ratio: 1 ratio (ref 0.46–4.00)
Free Lambda Excretion/Day: 11.6 mg/d
Free Lambda Lt Chains,Ur: 0.58 mg/dL (ref 0.08–1.01)
Free Lt Chn Excr Rate: 11.6 mg/d
Time: 24 hours
Total Protein, Urine-Ur/day: 30 mg/d (ref 10–140)
Total Protein, Urine: 1.5 mg/dL
Volume, Urine: 2000 mL

## 2007-03-18 ENCOUNTER — Ambulatory Visit: Payer: Self-pay | Admitting: Hematology & Oncology

## 2007-03-21 LAB — UIFE/LIGHT CHAINS/TP QN, 24-HR UR
Albumin, U: DETECTED
Free Kappa Lt Chains,Ur: 0.71 mg/dL (ref 0.04–1.51)
Free Kappa/Lambda Ratio: 0.46 ratio (ref 0.46–4.00)
Free Lambda Excretion/Day: 43.4 mg/d
Free Lambda Lt Chains,Ur: 1.55 mg/dL — ABNORMAL HIGH (ref 0.08–1.01)
Free Lt Chn Excr Rate: 19.88 mg/d
Gamma Globulin, Urine: DETECTED — AB
Time: 24 hours
Total Protein, Urine-Ur/day: 76 mg/d (ref 10–140)
Total Protein, Urine: 2.7 mg/dL
Volume, Urine: 2800 mL

## 2007-03-22 LAB — CBC WITH DIFFERENTIAL/PLATELET
BASO%: 3.5 % — ABNORMAL HIGH (ref 0.0–2.0)
Basophils Absolute: 0.2 10*3/uL — ABNORMAL HIGH (ref 0.0–0.1)
EOS%: 2.3 % (ref 0.0–7.0)
Eosinophils Absolute: 0.2 10*3/uL (ref 0.0–0.5)
HCT: 35.9 % (ref 34.8–46.6)
HGB: 12.2 g/dL (ref 11.6–15.9)
LYMPH%: 32.5 % (ref 14.0–48.0)
MCH: 27.1 pg (ref 26.0–34.0)
MCHC: 33.9 g/dL (ref 32.0–36.0)
MCV: 79.9 fL — ABNORMAL LOW (ref 81.0–101.0)
MONO#: 0.8 10*3/uL (ref 0.1–0.9)
MONO%: 12 % (ref 0.0–13.0)
NEUT#: 3.4 10*3/uL (ref 1.5–6.5)
NEUT%: 49.7 % (ref 39.6–76.8)
Platelets: 262 10*3/uL (ref 145–400)
RBC: 4.49 10*6/uL (ref 3.70–5.32)
RDW: 15.8 % — ABNORMAL HIGH (ref 11.3–14.5)
WBC: 6.9 10*3/uL (ref 3.9–10.0)
lymph#: 2.3 10*3/uL (ref 0.9–3.3)

## 2007-03-22 LAB — TSH: TSH: 1.361 u[IU]/mL (ref 0.350–5.500)

## 2007-03-22 LAB — LIPID PANEL
Cholesterol: 261 mg/dL — ABNORMAL HIGH (ref 0–200)
HDL: 57 mg/dL (ref >39)
LDL Cholesterol: 164 mg/dL — ABNORMAL HIGH (ref 0–99)
Total CHOL/HDL Ratio: 4.6 Ratio
Triglycerides: 199 mg/dL — ABNORMAL HIGH (ref <150)
VLDL: 40 mg/dL (ref 0–40)

## 2007-03-22 LAB — T3: T3, Total: 140.9 ng/dL (ref 80.0–204.0)

## 2007-03-22 LAB — HEMOGLOBIN A1C: Hgb A1c MFr Bld: 5.9 % (ref 4.6–6.1)

## 2007-03-26 LAB — COMPREHENSIVE METABOLIC PANEL
ALT: 21 U/L (ref 0–35)
AST: 11 U/L (ref 0–37)
Albumin: 4.3 g/dL (ref 3.5–5.2)
Alkaline Phosphatase: 39 U/L (ref 39–117)
BUN: 22 mg/dL (ref 6–23)
CO2: 22 mEq/L (ref 19–32)
Calcium: 9.3 mg/dL (ref 8.4–10.5)
Chloride: 107 mEq/L (ref 96–112)
Creatinine, Ser: 0.96 mg/dL (ref 0.40–1.20)
Glucose, Bld: 112 mg/dL — ABNORMAL HIGH (ref 70–99)
Potassium: 3.7 mEq/L (ref 3.5–5.3)
Sodium: 143 mEq/L (ref 135–145)
Total Bilirubin: 0.3 mg/dL (ref 0.3–1.2)
Total Protein: 6.5 g/dL (ref 6.0–8.3)

## 2007-03-26 LAB — PROTEIN ELECTROPHORESIS, SERUM
Albumin ELP: 63.5 % (ref 55.8–66.1)
Alpha-1-Globulin: 3.8 % (ref 2.9–4.9)
Alpha-2-Globulin: 12 % — ABNORMAL HIGH (ref 7.1–11.8)
Beta 2: 4.7 % (ref 3.2–6.5)
Beta Globulin: 7.3 % — ABNORMAL HIGH (ref 4.7–7.2)
Gamma Globulin: 8.7 % — ABNORMAL LOW (ref 11.1–18.8)
Total Protein, Serum Electrophoresis: 6.5 g/dL (ref 6.0–8.3)

## 2007-03-26 LAB — KAPPA/LAMBDA LIGHT CHAINS
Kappa free light chain: <0.54 mg/dL (ref 0.33–1.94)
Lambda Free Lght Chn: 2.69 mg/dL — ABNORMAL HIGH (ref 0.57–2.63)

## 2007-03-26 LAB — BETA 2 MICROGLOBULIN, SERUM: Beta-2 Microglobulin: 1.42 mg/L (ref 1.01–1.73)

## 2007-05-09 ENCOUNTER — Ambulatory Visit: Payer: Self-pay | Admitting: Hematology & Oncology

## 2007-05-13 LAB — CBC WITH DIFFERENTIAL/PLATELET
BASO%: 1.4 % (ref 0.0–2.0)
Basophils Absolute: 0.1 10*3/uL (ref 0.0–0.1)
EOS%: 3.9 % (ref 0.0–7.0)
Eosinophils Absolute: 0.2 10*3/uL (ref 0.0–0.5)
HCT: 36.2 % (ref 34.8–46.6)
HGB: 12.3 g/dL (ref 11.6–15.9)
LYMPH%: 39.5 % (ref 14.0–48.0)
MCH: 26.5 pg (ref 26.0–34.0)
MCHC: 33.8 g/dL (ref 32.0–36.0)
MCV: 78.3 fL — ABNORMAL LOW (ref 81.0–101.0)
MONO#: 0.5 10*3/uL (ref 0.1–0.9)
MONO%: 11 % (ref 0.0–13.0)
NEUT#: 2 10*3/uL (ref 1.5–6.5)
NEUT%: 44.2 % (ref 39.6–76.8)
Platelets: 291 10*3/uL (ref 145–400)
RBC: 4.62 10*6/uL (ref 3.70–5.32)
RDW: 15.6 % — ABNORMAL HIGH (ref 11.3–14.5)
WBC: 4.6 10*3/uL (ref 3.9–10.0)
lymph#: 1.8 10*3/uL (ref 0.9–3.3)

## 2007-05-15 LAB — UIFE/LIGHT CHAINS/TP QN, 24-HR UR
Albumin, U: DETECTED
Alpha 1, Urine: DETECTED — AB
Alpha 2, Urine: DETECTED — AB
Beta, Urine: DETECTED — AB
Free Kappa Lt Chains,Ur: 1.07 mg/dL (ref 0.04–1.51)
Free Kappa/Lambda Ratio: 0.21 ratio — ABNORMAL LOW (ref 0.46–4.00)
Free Lambda Excretion/Day: 72.8 mg/d
Free Lambda Lt Chains,Ur: 5.2 mg/dL — ABNORMAL HIGH (ref 0.08–1.01)
Free Lt Chn Excr Rate: 14.98 mg/d
Gamma Globulin, Urine: DETECTED — AB
Time: 24 hours
Total Protein, Urine-Ur/day: 115 mg/d (ref 10–140)
Total Protein, Urine: 8.2 mg/dL
Volume, Urine: 1400 mL

## 2007-05-17 LAB — PROTEIN ELECTROPHORESIS, SERUM
Albumin ELP: 62.8 % (ref 55.8–66.1)
Alpha-1-Globulin: 3.8 % (ref 2.9–4.9)
Alpha-2-Globulin: 12.4 % — ABNORMAL HIGH (ref 7.1–11.8)
Beta 2: 5.1 % (ref 3.2–6.5)
Beta Globulin: 6.7 % (ref 4.7–7.2)
Gamma Globulin: 9.2 % — ABNORMAL LOW (ref 11.1–18.8)
Total Protein, Serum Electrophoresis: 6.4 g/dL (ref 6.0–8.3)

## 2007-05-17 LAB — COMPREHENSIVE METABOLIC PANEL
ALT: 19 U/L (ref 0–35)
AST: 11 U/L (ref 0–37)
Albumin: 4.4 g/dL (ref 3.5–5.2)
Alkaline Phosphatase: 38 U/L — ABNORMAL LOW (ref 39–117)
BUN: 22 mg/dL (ref 6–23)
CO2: 24 mEq/L (ref 19–32)
Calcium: 9.9 mg/dL (ref 8.4–10.5)
Chloride: 104 mEq/L (ref 96–112)
Creatinine, Ser: 0.96 mg/dL (ref 0.40–1.20)
Glucose, Bld: 96 mg/dL (ref 70–99)
Potassium: 3.6 mEq/L (ref 3.5–5.3)
Sodium: 141 mEq/L (ref 135–145)
Total Bilirubin: 0.5 mg/dL (ref 0.3–1.2)
Total Protein: 6.4 g/dL (ref 6.0–8.3)

## 2007-05-17 LAB — IGG, IGA, IGM
IgA: 114 mg/dL (ref 68–378)
IgG (Immunoglobin G), Serum: 623 mg/dL — ABNORMAL LOW (ref 694–1618)
IgM, Serum: 48 mg/dL — ABNORMAL LOW (ref 60–263)

## 2007-05-17 LAB — KAPPA/LAMBDA LIGHT CHAINS
Kappa free light chain: 0.6 mg/dL (ref 0.33–1.94)
Kappa:Lambda Ratio: 0.16 — ABNORMAL LOW (ref 0.26–1.65)
Lambda Free Lght Chn: 3.8 mg/dL — ABNORMAL HIGH (ref 0.57–2.63)

## 2007-05-17 LAB — BETA 2 MICROGLOBULIN, SERUM: Beta-2 Microglobulin: 1.45 mg/L (ref 1.01–1.73)

## 2007-07-12 ENCOUNTER — Ambulatory Visit: Payer: Self-pay | Admitting: Hematology & Oncology

## 2007-07-16 LAB — COMPREHENSIVE METABOLIC PANEL
ALT: 31 U/L (ref 0–35)
AST: 20 U/L (ref 0–37)
Albumin: 4.4 g/dL (ref 3.5–5.2)
Alkaline Phosphatase: 45 U/L (ref 39–117)
BUN: 16 mg/dL (ref 6–23)
CO2: 24 mEq/L (ref 19–32)
Calcium: 9.7 mg/dL (ref 8.4–10.5)
Chloride: 104 mEq/L (ref 96–112)
Creatinine, Ser: 0.87 mg/dL (ref 0.40–1.20)
Glucose, Bld: 96 mg/dL (ref 70–99)
Potassium: 3.2 mEq/L — ABNORMAL LOW (ref 3.5–5.3)
Sodium: 140 mEq/L (ref 135–145)
Total Bilirubin: 0.5 mg/dL (ref 0.3–1.2)
Total Protein: 6.6 g/dL (ref 6.0–8.3)

## 2007-07-16 LAB — CBC WITH DIFFERENTIAL/PLATELET
BASO%: 0.9 % (ref 0.0–2.0)
Basophils Absolute: 0 10*3/uL (ref 0.0–0.1)
EOS%: 5.3 % (ref 0.0–7.0)
Eosinophils Absolute: 0.2 10*3/uL (ref 0.0–0.5)
HCT: 35.2 % (ref 34.8–46.6)
HGB: 11.8 g/dL (ref 11.6–15.9)
LYMPH%: 30 % (ref 14.0–48.0)
MCH: 26.3 pg (ref 26.0–34.0)
MCHC: 33.7 g/dL (ref 32.0–36.0)
MCV: 78.1 fL — ABNORMAL LOW (ref 81.0–101.0)
MONO#: 0.5 10*3/uL (ref 0.1–0.9)
MONO%: 10.8 % (ref 0.0–13.0)
NEUT#: 2.5 10*3/uL (ref 1.5–6.5)
NEUT%: 53 % (ref 39.6–76.8)
Platelets: 237 10*3/uL (ref 145–400)
RBC: 4.5 10*6/uL (ref 3.70–5.32)
RDW: 15.4 % — ABNORMAL HIGH (ref 11.3–14.5)
WBC: 4.7 10*3/uL (ref 3.9–10.0)
lymph#: 1.4 10*3/uL (ref 0.9–3.3)

## 2007-08-16 ENCOUNTER — Ambulatory Visit: Payer: Self-pay | Admitting: Hematology & Oncology

## 2007-08-20 LAB — UIFE/LIGHT CHAINS/TP QN, 24-HR UR
Albumin, U: DETECTED
Alpha 1, Urine: DETECTED — AB
Alpha 2, Urine: DETECTED — AB
Beta, Urine: DETECTED — AB
Free Kappa Lt Chains,Ur: 4.69 mg/dL — ABNORMAL HIGH (ref 0.04–1.51)
Free Kappa/Lambda Ratio: 0.55 ratio (ref 0.46–4.00)
Free Lambda Excretion/Day: 83.66 mg/d
Free Lambda Lt Chains,Ur: 8.58 mg/dL — ABNORMAL HIGH (ref 0.08–1.01)
Free Lt Chn Excr Rate: 45.73 mg/d
Gamma Globulin, Urine: DETECTED — AB
Time: 24 hours
Total Protein, Urine-Ur/day: 148 mg/d — ABNORMAL HIGH (ref 10–140)
Total Protein, Urine: 15.2 mg/dL
Volume, Urine: 975 mL

## 2007-08-21 LAB — CBC WITH DIFFERENTIAL (CANCER CENTER ONLY)
BASO#: 0.1 10*3/uL (ref 0.0–0.2)
BASO%: 1 % (ref 0.0–2.0)
EOS%: 3.1 % (ref 0.0–7.0)
Eosinophils Absolute: 0.2 10*3/uL (ref 0.0–0.5)
HCT: 38.4 % (ref 34.8–46.6)
HGB: 12.7 g/dL (ref 11.6–15.9)
LYMPH#: 2.6 10*3/uL (ref 0.9–3.3)
LYMPH%: 48.4 % — ABNORMAL HIGH (ref 14.0–48.0)
MCH: 26.5 pg (ref 26.0–34.0)
MCHC: 33 g/dL (ref 32.0–36.0)
MCV: 80 fL — ABNORMAL LOW (ref 81–101)
MONO#: 0.5 10*3/uL (ref 0.1–0.9)
MONO%: 9.3 % (ref 0.0–13.0)
NEUT#: 2 10*3/uL (ref 1.5–6.5)
NEUT%: 38.2 % — ABNORMAL LOW (ref 39.6–80.0)
Platelets: 272 10*3/uL (ref 145–400)
RBC: 4.79 10*6/uL (ref 3.70–5.32)
RDW: 14.1 % (ref 10.5–14.6)
WBC: 5.3 10*3/uL (ref 3.9–10.0)

## 2007-08-21 LAB — COMPREHENSIVE METABOLIC PANEL
ALT: 30 U/L (ref 0–35)
AST: 21 U/L (ref 0–37)
Albumin: 4.5 g/dL (ref 3.5–5.2)
Alkaline Phosphatase: 48 U/L (ref 39–117)
BUN: 17 mg/dL (ref 6–23)
CO2: 23 mEq/L (ref 19–32)
Calcium: 9.7 mg/dL (ref 8.4–10.5)
Chloride: 104 mEq/L (ref 96–112)
Creatinine, Ser: 0.95 mg/dL (ref 0.40–1.20)
Glucose, Bld: 98 mg/dL (ref 70–99)
Potassium: 3.2 mEq/L — ABNORMAL LOW (ref 3.5–5.3)
Sodium: 140 mEq/L (ref 135–145)
Total Bilirubin: 0.4 mg/dL (ref 0.3–1.2)
Total Protein: 7 g/dL (ref 6.0–8.3)

## 2007-08-23 LAB — KAPPA/LAMBDA LIGHT CHAINS
Kappa free light chain: 0.86 mg/dL (ref 0.33–1.94)
Kappa:Lambda Ratio: 0.22 — ABNORMAL LOW (ref 0.26–1.65)
Lambda Free Lght Chn: 3.89 mg/dL — ABNORMAL HIGH (ref 0.57–2.63)

## 2007-08-23 LAB — PROTEIN ELECTROPHORESIS, SERUM
Albumin ELP: 61.3 % (ref 55.8–66.1)
Alpha-1-Globulin: 3.9 % (ref 2.9–4.9)
Alpha-2-Globulin: 10.8 % (ref 7.1–11.8)
Beta 2: 6 % (ref 3.2–6.5)
Beta Globulin: 7 % (ref 4.7–7.2)
Gamma Globulin: 11 % — ABNORMAL LOW (ref 11.1–18.8)
Total Protein, Serum Electrophoresis: 6.8 g/dL (ref 6.0–8.3)

## 2007-08-23 LAB — IGG, IGA, IGM
IgA: 186 mg/dL (ref 68–378)
IgG (Immunoglobin G), Serum: 793 mg/dL (ref 694–1618)
IgM, Serum: 42 mg/dL — ABNORMAL LOW (ref 60–263)

## 2007-11-08 ENCOUNTER — Ambulatory Visit: Payer: Self-pay | Admitting: Hematology & Oncology

## 2007-11-11 DIAGNOSIS — C9000 Multiple myeloma not having achieved remission: Secondary | ICD-10-CM | POA: Insufficient documentation

## 2007-11-11 HISTORY — DX: Multiple myeloma not having achieved remission: C90.00

## 2007-11-11 LAB — CBC WITH DIFFERENTIAL (CANCER CENTER ONLY)
BASO#: 0 10*3/uL (ref 0.0–0.2)
BASO%: 0.8 % (ref 0.0–2.0)
EOS%: 4.4 % (ref 0.0–7.0)
Eosinophils Absolute: 0.2 10*3/uL (ref 0.0–0.5)
HCT: 36.6 % (ref 34.8–46.6)
HGB: 12.1 g/dL (ref 11.6–15.9)
LYMPH#: 1.7 10*3/uL (ref 0.9–3.3)
LYMPH%: 34.8 % (ref 14.0–48.0)
MCH: 24.9 pg — ABNORMAL LOW (ref 26.0–34.0)
MCHC: 32.9 g/dL (ref 32.0–36.0)
MCV: 76 fL — ABNORMAL LOW (ref 81–101)
MONO#: 0.4 10*3/uL (ref 0.1–0.9)
MONO%: 7.4 % (ref 0.0–13.0)
NEUT#: 2.5 10*3/uL (ref 1.5–6.5)
NEUT%: 52.6 % (ref 39.6–80.0)
Platelets: 245 10*3/uL (ref 145–400)
RBC: 4.85 10*6/uL (ref 3.70–5.32)
RDW: 15.9 % — ABNORMAL HIGH (ref 10.5–14.6)
WBC: 4.7 10*3/uL (ref 3.9–10.0)

## 2007-11-11 LAB — CMP (CANCER CENTER ONLY)
ALT(SGPT): 28 U/L (ref 10–47)
AST: 27 U/L (ref 11–38)
Albumin: 4 g/dL (ref 3.3–5.5)
Alkaline Phosphatase: 54 U/L (ref 26–84)
BUN, Bld: 12 mg/dL (ref 7–22)
CO2: 25 mEq/L (ref 18–33)
Calcium: 9.3 mg/dL (ref 8.0–10.3)
Chloride: 107 mEq/L (ref 98–108)
Creat: 0.7 mg/dl (ref 0.6–1.2)
Glucose, Bld: 118 mg/dL (ref 73–118)
Potassium: 4.2 mEq/L (ref 3.3–4.7)
Sodium: 140 mEq/L (ref 128–145)
Total Bilirubin: 0.4 mg/dl (ref 0.20–1.60)
Total Protein: 6.8 g/dL (ref 6.4–8.1)

## 2007-11-13 LAB — UIFE/LIGHT CHAINS/TP QN, 24-HR UR
Albumin, U: DETECTED
Alpha 1, Urine: DETECTED — AB
Alpha 2, Urine: DETECTED — AB
Beta, Urine: DETECTED — AB
Free Kappa Lt Chains,Ur: 2.79 mg/dL — ABNORMAL HIGH (ref 0.04–1.51)
Free Kappa/Lambda Ratio: 0.49 ratio (ref 0.46–4.00)
Free Lambda Excretion/Day: 82.8 mg/d
Free Lambda Lt Chains,Ur: 5.71 mg/dL — ABNORMAL HIGH (ref 0.08–1.01)
Free Lt Chn Excr Rate: 40.46 mg/d
Gamma Globulin, Urine: DETECTED — AB
Time: 24 hours
Total Protein, Urine-Ur/day: 138 mg/d (ref 10–140)
Total Protein, Urine: 9.5 mg/dL
Volume, Urine: 1450 mL

## 2007-11-27 ENCOUNTER — Ambulatory Visit: Payer: Self-pay | Admitting: Hematology & Oncology

## 2007-11-27 LAB — CBC WITH DIFFERENTIAL/PLATELET
BASO%: 2.4 % — ABNORMAL HIGH (ref 0.0–2.0)
Basophils Absolute: 0.1 10*3/uL (ref 0.0–0.1)
EOS%: 3.7 % (ref 0.0–7.0)
Eosinophils Absolute: 0.2 10*3/uL (ref 0.0–0.5)
HCT: 35.3 % (ref 34.8–46.6)
HGB: 11.6 g/dL (ref 11.6–15.9)
LYMPH%: 45.4 % (ref 14.0–48.0)
MCH: 25.2 pg — ABNORMAL LOW (ref 26.0–34.0)
MCHC: 32.9 g/dL (ref 32.0–36.0)
MCV: 76.5 fL — ABNORMAL LOW (ref 81.0–101.0)
MONO#: 0.5 10*3/uL (ref 0.1–0.9)
MONO%: 9.6 % (ref 0.0–13.0)
NEUT#: 2 10*3/uL (ref 1.5–6.5)
NEUT%: 38.9 % — ABNORMAL LOW (ref 39.6–76.8)
Platelets: 259 10*3/uL (ref 145–400)
RBC: 4.62 10*6/uL (ref 3.70–5.32)
RDW: 15.2 % — ABNORMAL HIGH (ref 11.3–14.5)
WBC: 5 10*3/uL (ref 3.9–10.0)
lymph#: 2.3 10*3/uL (ref 0.9–3.3)

## 2008-02-12 ENCOUNTER — Encounter: Admission: RE | Admit: 2008-02-12 | Discharge: 2008-05-12 | Payer: Self-pay | Admitting: Otolaryngology

## 2008-02-14 ENCOUNTER — Ambulatory Visit: Payer: Self-pay | Admitting: Hematology & Oncology

## 2008-02-17 LAB — CBC WITH DIFFERENTIAL (CANCER CENTER ONLY)
BASO#: 0 10*3/uL (ref 0.0–0.2)
BASO%: 0.7 % (ref 0.0–2.0)
EOS%: 3.8 % (ref 0.0–7.0)
Eosinophils Absolute: 0.2 10*3/uL (ref 0.0–0.5)
HCT: 36.5 % (ref 34.8–46.6)
HGB: 12.1 g/dL (ref 11.6–15.9)
LYMPH#: 1.5 10*3/uL (ref 0.9–3.3)
LYMPH%: 35.4 % (ref 14.0–48.0)
MCH: 25.7 pg — ABNORMAL LOW (ref 26.0–34.0)
MCHC: 33.1 g/dL (ref 32.0–36.0)
MCV: 78 fL — ABNORMAL LOW (ref 81–101)
MONO#: 0.2 10*3/uL (ref 0.1–0.9)
MONO%: 5.6 % (ref 0.0–13.0)
NEUT#: 2.3 10*3/uL (ref 1.5–6.5)
NEUT%: 54.5 % (ref 39.6–80.0)
Platelets: 230 10*3/uL (ref 145–400)
RBC: 4.7 10*6/uL (ref 3.70–5.32)
RDW: 15.9 % — ABNORMAL HIGH (ref 10.5–14.6)
WBC: 4.2 10*3/uL (ref 3.9–10.0)

## 2008-02-19 LAB — IGG, IGA, IGM
IgA: 246 mg/dL (ref 68–378)
IgG (Immunoglobin G), Serum: 954 mg/dL (ref 694–1618)
IgM, Serum: 55 mg/dL — ABNORMAL LOW (ref 60–263)

## 2008-02-19 LAB — PROTEIN ELECTROPHORESIS, SERUM
Albumin ELP: 59.3 % (ref 55.8–66.1)
Alpha-1-Globulin: 3.9 % (ref 2.9–4.9)
Alpha-2-Globulin: 10.2 % (ref 7.1–11.8)
Beta 2: 5.8 % (ref 3.2–6.5)
Beta Globulin: 6.7 % (ref 4.7–7.2)
Gamma Globulin: 14.1 % (ref 11.1–18.8)
Total Protein, Serum Electrophoresis: 6.9 g/dL (ref 6.0–8.3)

## 2008-02-19 LAB — KAPPA/LAMBDA LIGHT CHAINS
Kappa free light chain: 1.8 mg/dL (ref 0.33–1.94)
Kappa:Lambda Ratio: 0.59 (ref 0.26–1.65)
Lambda Free Lght Chn: 3.06 mg/dL — ABNORMAL HIGH (ref 0.57–2.63)

## 2008-02-19 LAB — LACTATE DEHYDROGENASE: LDH: 159 U/L (ref 94–250)

## 2008-02-19 LAB — BETA 2 MICROGLOBULIN, SERUM: Beta-2 Microglobulin: 1.53 mg/L (ref 1.01–1.73)

## 2008-02-19 LAB — COMPREHENSIVE METABOLIC PANEL
ALT: 17 U/L (ref 0–35)
AST: 14 U/L (ref 0–37)
Albumin: 4.3 g/dL (ref 3.5–5.2)
Alkaline Phosphatase: 54 U/L (ref 39–117)
BUN: 14 mg/dL (ref 6–23)
CO2: 24 mEq/L (ref 19–32)
Calcium: 9.7 mg/dL (ref 8.4–10.5)
Chloride: 102 mEq/L (ref 96–112)
Creatinine, Ser: 0.92 mg/dL (ref 0.40–1.20)
Glucose, Bld: 86 mg/dL (ref 70–99)
Potassium: 3.7 mEq/L (ref 3.5–5.3)
Sodium: 138 mEq/L (ref 135–145)
Total Bilirubin: 0.5 mg/dL (ref 0.3–1.2)
Total Protein: 6.9 g/dL (ref 6.0–8.3)

## 2008-05-06 ENCOUNTER — Ambulatory Visit: Payer: Self-pay | Admitting: Hematology

## 2008-05-22 ENCOUNTER — Ambulatory Visit: Payer: Self-pay | Admitting: Hematology & Oncology

## 2008-05-25 LAB — CBC WITH DIFFERENTIAL (CANCER CENTER ONLY)
BASO#: 0.1 10*3/uL (ref 0.0–0.2)
BASO%: 0.9 % (ref 0.0–2.0)
EOS%: 3.8 % (ref 0.0–7.0)
Eosinophils Absolute: 0.2 10*3/uL (ref 0.0–0.5)
HCT: 35.9 % (ref 34.8–46.6)
HGB: 11.9 g/dL (ref 11.6–15.9)
LYMPH#: 1.5 10*3/uL (ref 0.9–3.3)
LYMPH%: 29.1 % (ref 14.0–48.0)
MCH: 26.1 pg (ref 26.0–34.0)
MCHC: 33.2 g/dL (ref 32.0–36.0)
MCV: 79 fL — ABNORMAL LOW (ref 81–101)
MONO#: 0.4 10*3/uL (ref 0.1–0.9)
MONO%: 7.5 % (ref 0.0–13.0)
NEUT#: 3.1 10*3/uL (ref 1.5–6.5)
NEUT%: 58.7 % (ref 39.6–80.0)
Platelets: 242 10*3/uL (ref 145–400)
RBC: 4.57 10*6/uL (ref 3.70–5.32)
RDW: 15.2 % — ABNORMAL HIGH (ref 10.5–14.6)
WBC: 5.3 10*3/uL (ref 3.9–10.0)

## 2008-09-03 ENCOUNTER — Ambulatory Visit: Payer: Self-pay | Admitting: Hematology & Oncology

## 2008-09-07 LAB — CBC WITH DIFFERENTIAL (CANCER CENTER ONLY)
BASO#: 0 10*3/uL (ref 0.0–0.2)
BASO%: 0.8 % (ref 0.0–2.0)
EOS%: 3.6 % (ref 0.0–7.0)
Eosinophils Absolute: 0.2 10*3/uL (ref 0.0–0.5)
HCT: 36.7 % (ref 34.8–46.6)
HGB: 11.9 g/dL (ref 11.6–15.9)
LYMPH#: 1.5 10*3/uL (ref 0.9–3.3)
LYMPH%: 32.4 % (ref 14.0–48.0)
MCH: 26.1 pg (ref 26.0–34.0)
MCHC: 32.5 g/dL (ref 32.0–36.0)
MCV: 80 fL — ABNORMAL LOW (ref 81–101)
MONO#: 0.3 10*3/uL (ref 0.1–0.9)
MONO%: 5.8 % (ref 0.0–13.0)
NEUT#: 2.6 10*3/uL (ref 1.5–6.5)
NEUT%: 57.4 % (ref 39.6–80.0)
Platelets: 248 10*3/uL (ref 145–400)
RBC: 4.57 10*6/uL (ref 3.70–5.32)
RDW: 14.8 % — ABNORMAL HIGH (ref 10.5–14.6)
WBC: 4.6 10*3/uL (ref 3.9–10.0)

## 2008-09-10 LAB — UIFE/LIGHT CHAINS/TP QN, 24-HR UR
Albumin, U: DETECTED
Free Kappa Lt Chains,Ur: 1.55 mg/dL — ABNORMAL HIGH (ref 0.04–1.51)
Free Kappa/Lambda Ratio: 0.93 ratio (ref 0.46–4.00)
Free Lambda Excretion/Day: 32.98 mg/d
Free Lambda Lt Chains,Ur: 1.67 mg/dL — ABNORMAL HIGH (ref 0.08–1.01)
Free Lt Chn Excr Rate: 30.61 mg/d
Gamma Globulin, Urine: DETECTED — AB
Time: 24 hours
Total Protein, Urine-Ur/day: 75 mg/d (ref 10–140)
Total Protein, Urine: 3.8 mg/dL
Volume, Urine: 1975 mL

## 2008-09-10 LAB — IGG, IGA, IGM
IgA: 283 mg/dL (ref 68–378)
IgG (Immunoglobin G), Serum: 941 mg/dL (ref 694–1618)
IgM, Serum: 46 mg/dL — ABNORMAL LOW (ref 60–263)

## 2008-09-10 LAB — KAPPA/LAMBDA LIGHT CHAINS
Kappa free light chain: 0.9 mg/dL (ref 0.33–1.94)
Kappa:Lambda Ratio: 0.33 (ref 0.26–1.65)
Lambda Free Lght Chn: 2.73 mg/dL — ABNORMAL HIGH (ref 0.57–2.63)

## 2008-09-10 LAB — COMPREHENSIVE METABOLIC PANEL
ALT: 20 U/L (ref 0–35)
AST: 15 U/L (ref 0–37)
Albumin: 3.9 g/dL (ref 3.5–5.2)
Alkaline Phosphatase: 60 U/L (ref 39–117)
BUN: 9 mg/dL (ref 6–23)
CO2: 26 mEq/L (ref 19–32)
Calcium: 9 mg/dL (ref 8.4–10.5)
Chloride: 103 mEq/L (ref 96–112)
Creatinine, Ser: 0.84 mg/dL (ref 0.40–1.20)
Glucose, Bld: 84 mg/dL (ref 70–99)
Potassium: 3.1 mEq/L — ABNORMAL LOW (ref 3.5–5.3)
Sodium: 139 mEq/L (ref 135–145)
Total Bilirubin: 0.5 mg/dL (ref 0.3–1.2)
Total Protein: 6.5 g/dL (ref 6.0–8.3)

## 2008-09-10 LAB — PROTEIN ELECTROPHORESIS, SERUM
Albumin ELP: 59.1 % (ref 55.8–66.1)
Alpha-1-Globulin: 3.8 % (ref 2.9–4.9)
Alpha-2-Globulin: 9.9 % (ref 7.1–11.8)
Beta 2: 5.9 % (ref 3.2–6.5)
Beta Globulin: 6.9 % (ref 4.7–7.2)
Gamma Globulin: 14.4 % (ref 11.1–18.8)
Total Protein, Serum Electrophoresis: 6.5 g/dL (ref 6.0–8.3)

## 2008-09-10 LAB — LACTATE DEHYDROGENASE: LDH: 143 U/L (ref 94–250)

## 2009-01-18 ENCOUNTER — Ambulatory Visit: Payer: Self-pay | Admitting: Hematology

## 2009-01-19 ENCOUNTER — Ambulatory Visit: Payer: Self-pay | Admitting: Hematology & Oncology

## 2009-01-20 LAB — UIFE/LIGHT CHAINS/TP QN, 24-HR UR
Alpha 1, Urine: DETECTED — AB
Alpha 2, Urine: DETECTED — AB
Beta, Urine: DETECTED — AB
Free Kappa Lt Chains,Ur: 2.38 mg/dL — ABNORMAL HIGH (ref 0.04–1.51)
Free Kappa/Lambda Ratio: 1.79 ratio (ref 0.46–4.00)
Free Lambda Excretion/Day: 22.61 mg/d
Free Lambda Lt Chains,Ur: 1.33 mg/dL — ABNORMAL HIGH (ref 0.08–1.01)
Free Lt Chn Excr Rate: 40.46 mg/d
Time: 24 hours
Total Protein, Urine-Ur/day: 78 mg/d (ref 10–140)
Total Protein, Urine: 4.6 mg/dL
Volume, Urine: 1700 mL

## 2009-01-20 LAB — CBC WITH DIFFERENTIAL (CANCER CENTER ONLY)
BASO#: 0.1 10*3/uL (ref 0.0–0.2)
BASO%: 1.1 % (ref 0.0–2.0)
EOS%: 3.8 % (ref 0.0–7.0)
Eosinophils Absolute: 0.2 10*3/uL (ref 0.0–0.5)
HCT: 35.2 % (ref 34.8–46.6)
HGB: 11.7 g/dL (ref 11.6–15.9)
LYMPH#: 1.8 10*3/uL (ref 0.9–3.3)
LYMPH%: 37.5 % (ref 14.0–48.0)
MCH: 25.5 pg — ABNORMAL LOW (ref 26.0–34.0)
MCHC: 33.4 g/dL (ref 32.0–36.0)
MCV: 76 fL — ABNORMAL LOW (ref 81–101)
MONO#: 0.5 10*3/uL (ref 0.1–0.9)
MONO%: 10.3 % (ref 0.0–13.0)
NEUT#: 2.3 10*3/uL (ref 1.5–6.5)
NEUT%: 47.3 % (ref 39.6–80.0)
Platelets: 271 10*3/uL (ref 145–400)
RBC: 4.6 10*6/uL (ref 3.70–5.32)
RDW: 16.1 % — ABNORMAL HIGH (ref 10.5–14.6)
WBC: 4.8 10*3/uL (ref 3.9–10.0)

## 2009-01-25 LAB — PROTEIN ELECTROPHORESIS, SERUM
Albumin ELP: 59.7 % (ref 55.8–66.1)
Alpha-1-Globulin: 3.4 % (ref 2.9–4.9)
Alpha-2-Globulin: 9.3 % (ref 7.1–11.8)
Beta 2: 5.8 % (ref 3.2–6.5)
Beta Globulin: 7 % (ref 4.7–7.2)
Gamma Globulin: 14.8 % (ref 11.1–18.8)
Total Protein, Serum Electrophoresis: 6.8 g/dL (ref 6.0–8.3)

## 2009-01-25 LAB — COMPREHENSIVE METABOLIC PANEL
ALT: 25 U/L (ref 0–35)
AST: 17 U/L (ref 0–37)
Albumin: 4.1 g/dL (ref 3.5–5.2)
Alkaline Phosphatase: 62 U/L (ref 39–117)
BUN: 18 mg/dL (ref 6–23)
CO2: 22 mEq/L (ref 19–32)
Calcium: 9.3 mg/dL (ref 8.4–10.5)
Chloride: 107 mEq/L (ref 96–112)
Creatinine, Ser: 0.77 mg/dL (ref 0.40–1.20)
Glucose, Bld: 84 mg/dL (ref 70–99)
Potassium: 4.1 mEq/L (ref 3.5–5.3)
Sodium: 141 mEq/L (ref 135–145)
Total Bilirubin: 0.3 mg/dL (ref 0.3–1.2)
Total Protein: 6.8 g/dL (ref 6.0–8.3)

## 2009-01-25 LAB — KAPPA/LAMBDA LIGHT CHAINS
Kappa free light chain: 0.98 mg/dL (ref 0.33–1.94)
Kappa:Lambda Ratio: 0.44 (ref 0.26–1.65)
Lambda Free Lght Chn: 2.22 mg/dL (ref 0.57–2.63)

## 2009-05-20 ENCOUNTER — Ambulatory Visit: Payer: Self-pay | Admitting: Hematology & Oncology

## 2009-05-26 LAB — UIFE/LIGHT CHAINS/TP QN, 24-HR UR
Albumin, U: DETECTED
Alpha 1, Urine: DETECTED — AB
Alpha 2, Urine: DETECTED — AB
Beta, Urine: DETECTED — AB
Free Kappa Lt Chains,Ur: 3.76 mg/dL — ABNORMAL HIGH (ref 0.04–1.51)
Free Kappa/Lambda Ratio: 1.66 ratio (ref 0.46–4.00)
Free Lambda Excretion/Day: 31.64 mg/d
Free Lambda Lt Chains,Ur: 2.26 mg/dL — ABNORMAL HIGH (ref 0.08–1.01)
Free Lt Chn Excr Rate: 52.64 mg/d
Gamma Globulin, Urine: DETECTED — AB
Time: 24 hours
Total Protein, Urine-Ur/day: 105 mg/d (ref 10–140)
Total Protein, Urine: 7.5 mg/dL
Volume, Urine: 1400 mL

## 2009-05-27 LAB — CBC WITH DIFFERENTIAL (CANCER CENTER ONLY)
BASO#: 0 10*3/uL (ref 0.0–0.2)
BASO%: 0.9 % (ref 0.0–2.0)
EOS%: 3.8 % (ref 0.0–7.0)
Eosinophils Absolute: 0.2 10*3/uL (ref 0.0–0.5)
HCT: 36.9 % (ref 34.8–46.6)
HGB: 12.2 g/dL (ref 11.6–15.9)
LYMPH#: 1.5 10*3/uL (ref 0.9–3.3)
LYMPH%: 32.6 % (ref 14.0–48.0)
MCH: 26.1 pg (ref 26.0–34.0)
MCHC: 33 g/dL (ref 32.0–36.0)
MCV: 79 fL — ABNORMAL LOW (ref 81–101)
MONO#: 0.4 10*3/uL (ref 0.1–0.9)
MONO%: 9.1 % (ref 0.0–13.0)
NEUT#: 2.5 10*3/uL (ref 1.5–6.5)
NEUT%: 53.6 % (ref 39.6–80.0)
Platelets: 248 10*3/uL (ref 145–400)
RBC: 4.67 10*6/uL (ref 3.70–5.32)
RDW: 15.4 % — ABNORMAL HIGH (ref 10.5–14.6)
WBC: 4.6 10*3/uL (ref 3.9–10.0)

## 2009-05-31 LAB — PROTEIN ELECTROPHORESIS, SERUM
Albumin ELP: 58.8 % (ref 55.8–66.1)
Alpha-1-Globulin: 3.7 % (ref 2.9–4.9)
Alpha-2-Globulin: 10.6 % (ref 7.1–11.8)
Beta 2: 5.8 % (ref 3.2–6.5)
Beta Globulin: 7.1 % (ref 4.7–7.2)
Gamma Globulin: 14 % (ref 11.1–18.8)
Total Protein, Serum Electrophoresis: 6.9 g/dL (ref 6.0–8.3)

## 2009-05-31 LAB — COMPREHENSIVE METABOLIC PANEL
ALT: 32 U/L (ref 0–35)
AST: 24 U/L (ref 0–37)
Albumin: 4.3 g/dL (ref 3.5–5.2)
Alkaline Phosphatase: 72 U/L (ref 39–117)
BUN: 13 mg/dL (ref 6–23)
CO2: 25 mEq/L (ref 19–32)
Calcium: 8.9 mg/dL (ref 8.4–10.5)
Chloride: 106 mEq/L (ref 96–112)
Creatinine, Ser: 0.81 mg/dL (ref 0.40–1.20)
Glucose, Bld: 83 mg/dL (ref 70–99)
Potassium: 3.6 mEq/L (ref 3.5–5.3)
Sodium: 141 mEq/L (ref 135–145)
Total Bilirubin: 0.5 mg/dL (ref 0.3–1.2)
Total Protein: 6.9 g/dL (ref 6.0–8.3)

## 2009-05-31 LAB — IGG, IGA, IGM
IgA: 256 mg/dL (ref 68–378)
IgG (Immunoglobin G), Serum: 898 mg/dL (ref 694–1618)
IgM, Serum: 40 mg/dL — ABNORMAL LOW (ref 60–263)

## 2009-05-31 LAB — KAPPA/LAMBDA LIGHT CHAINS
Kappa free light chain: 0.58 mg/dL (ref 0.33–1.94)
Kappa:Lambda Ratio: 0.36 (ref 0.26–1.65)
Lambda Free Lght Chn: 1.63 mg/dL (ref 0.57–2.63)

## 2009-05-31 LAB — VITAMIN D 25 HYDROXY (VIT D DEFICIENCY, FRACTURES): Vit D, 25-Hydroxy: 10 ng/mL — ABNORMAL LOW (ref 30–89)

## 2009-09-03 ENCOUNTER — Ambulatory Visit: Payer: Self-pay | Admitting: Hematology & Oncology

## 2009-09-03 LAB — CBC WITH DIFFERENTIAL (CANCER CENTER ONLY)
BASO#: 0.1 10*3/uL (ref 0.0–0.2)
BASO%: 1.6 % (ref 0.0–2.0)
EOS%: 3.3 % (ref 0.0–7.0)
Eosinophils Absolute: 0.1 10*3/uL (ref 0.0–0.5)
HCT: 35.2 % (ref 34.8–46.6)
HGB: 11.6 g/dL (ref 11.6–15.9)
LYMPH#: 1.5 10*3/uL (ref 0.9–3.3)
LYMPH%: 42.8 % (ref 14.0–48.0)
MCH: 26.2 pg (ref 26.0–34.0)
MCHC: 32.9 g/dL (ref 32.0–36.0)
MCV: 80 fL — ABNORMAL LOW (ref 81–101)
MONO#: 0.4 10*3/uL (ref 0.1–0.9)
MONO%: 10.6 % (ref 0.0–13.0)
NEUT#: 1.5 10*3/uL (ref 1.5–6.5)
NEUT%: 41.7 % (ref 39.6–80.0)
Platelets: 270 10*3/uL (ref 145–400)
RBC: 4.41 10*6/uL (ref 3.70–5.32)
RDW: 15.3 % — ABNORMAL HIGH (ref 10.5–14.6)
WBC: 3.6 10*3/uL — ABNORMAL LOW (ref 3.9–10.0)

## 2009-09-07 LAB — COMPREHENSIVE METABOLIC PANEL
ALT: 24 U/L (ref 0–35)
AST: 18 U/L (ref 0–37)
Albumin: 4.2 g/dL (ref 3.5–5.2)
Alkaline Phosphatase: 64 U/L (ref 39–117)
BUN: 14 mg/dL (ref 6–23)
CO2: 26 mEq/L (ref 19–32)
Calcium: 9.1 mg/dL (ref 8.4–10.5)
Chloride: 105 mEq/L (ref 96–112)
Creatinine, Ser: 0.84 mg/dL (ref 0.40–1.20)
Glucose, Bld: 87 mg/dL (ref 70–99)
Potassium: 3.3 mEq/L — ABNORMAL LOW (ref 3.5–5.3)
Sodium: 143 mEq/L (ref 135–145)
Total Bilirubin: 0.3 mg/dL (ref 0.3–1.2)
Total Protein: 6.6 g/dL (ref 6.0–8.3)

## 2009-09-07 LAB — UIFE/LIGHT CHAINS/TP QN, 24-HR UR
Albumin, U: DETECTED
Alpha 1, Urine: DETECTED — AB
Alpha 2, Urine: DETECTED — AB
Beta, Urine: DETECTED — AB
Free Kappa Lt Chains,Ur: 3.75 mg/dL — ABNORMAL HIGH (ref 0.04–1.51)
Free Kappa/Lambda Ratio: 1.59 ratio (ref 0.46–4.00)
Free Lambda Excretion/Day: 44.84 mg/d
Free Lambda Lt Chains,Ur: 2.36 mg/dL — ABNORMAL HIGH (ref 0.08–1.01)
Free Lt Chn Excr Rate: 71.25 mg/d
Gamma Globulin, Urine: DETECTED — AB
Time: 24 hours
Total Protein, Urine-Ur/day: 141 mg/d — ABNORMAL HIGH (ref 10–140)
Total Protein, Urine: 7.4 mg/dL
Volume, Urine: 1900 mL

## 2009-09-07 LAB — KAPPA/LAMBDA LIGHT CHAINS
Kappa free light chain: 0.89 mg/dL (ref 0.33–1.94)
Kappa:Lambda Ratio: 0.43 (ref 0.26–1.65)
Lambda Free Lght Chn: 2.06 mg/dL (ref 0.57–2.63)

## 2009-09-07 LAB — IMMUNOFIXATION ELECTROPHORESIS
IgA: 282 mg/dL (ref 68–378)
IgG (Immunoglobin G), Serum: 1040 mg/dL (ref 694–1618)
IgM, Serum: 34 mg/dL — ABNORMAL LOW (ref 60–263)
Total Protein, Serum Electrophoresis: 6.6 g/dL (ref 6.0–8.3)

## 2009-11-11 ENCOUNTER — Ambulatory Visit (HOSPITAL_COMMUNITY)
Admission: RE | Admit: 2009-11-11 | Discharge: 2009-11-11 | Payer: Self-pay | Source: Home / Self Care | Admitting: Hematology & Oncology

## 2010-01-04 ENCOUNTER — Ambulatory Visit: Payer: Self-pay | Admitting: Hematology & Oncology

## 2010-01-04 LAB — CBC WITH DIFFERENTIAL (CANCER CENTER ONLY)
BASO#: 0 10*3/uL (ref 0.0–0.2)
BASO%: 0.7 % (ref 0.0–2.0)
EOS%: 3.8 % (ref 0.0–7.0)
Eosinophils Absolute: 0.2 10*3/uL (ref 0.0–0.5)
HCT: 36.4 % (ref 34.8–46.6)
HGB: 11.7 g/dL (ref 11.6–15.9)
LYMPH#: 1.5 10*3/uL (ref 0.9–3.3)
LYMPH%: 36.7 % (ref 14.0–48.0)
MCH: 26.1 pg (ref 26.0–34.0)
MCHC: 32.1 g/dL (ref 32.0–36.0)
MCV: 81 fL (ref 81–101)
MONO#: 0.4 10*3/uL (ref 0.1–0.9)
MONO%: 8.9 % (ref 0.0–13.0)
NEUT#: 2.1 10*3/uL (ref 1.5–6.5)
NEUT%: 49.9 % (ref 39.6–80.0)
Platelets: 286 10*3/uL (ref 145–400)
RBC: 4.49 10*6/uL (ref 3.70–5.32)
RDW: 15.5 % — ABNORMAL HIGH (ref 10.5–14.6)
WBC: 4.1 10*3/uL (ref 3.9–10.0)

## 2010-01-06 LAB — SPEP & IFE WITH QIG
Albumin ELP: 58.7 % (ref 55.8–66.1)
Alpha-1-Globulin: 3.5 % (ref 2.9–4.9)
Alpha-2-Globulin: 9.5 % (ref 7.1–11.8)
Beta 2: 6.3 % (ref 3.2–6.5)
Beta Globulin: 7.4 % — ABNORMAL HIGH (ref 4.7–7.2)
Gamma Globulin: 14.6 % (ref 11.1–18.8)
IgA: 266 mg/dL (ref 68–378)
IgG (Immunoglobin G), Serum: 997 mg/dL (ref 694–1618)
IgM, Serum: 35 mg/dL — ABNORMAL LOW (ref 60–263)
Total Protein, Serum Electrophoresis: 6.8 g/dL (ref 6.0–8.3)

## 2010-01-06 LAB — COMPREHENSIVE METABOLIC PANEL
ALT: 21 U/L (ref 0–35)
AST: 17 U/L (ref 0–37)
Albumin: 4.3 g/dL (ref 3.5–5.2)
Alkaline Phosphatase: 61 U/L (ref 39–117)
BUN: 14 mg/dL (ref 6–23)
CO2: 24 mEq/L (ref 19–32)
Calcium: 9.7 mg/dL (ref 8.4–10.5)
Chloride: 106 mEq/L (ref 96–112)
Creatinine, Ser: 1.02 mg/dL (ref 0.40–1.20)
Glucose, Bld: 90 mg/dL (ref 70–99)
Potassium: 3.9 mEq/L (ref 3.5–5.3)
Sodium: 139 mEq/L (ref 135–145)
Total Bilirubin: 0.3 mg/dL (ref 0.3–1.2)
Total Protein: 6.8 g/dL (ref 6.0–8.3)

## 2010-01-06 LAB — KAPPA/LAMBDA LIGHT CHAINS
Kappa free light chain: 0.74 mg/dL (ref 0.33–1.94)
Kappa:Lambda Ratio: 0.33 (ref 0.26–1.65)
Lambda Free Lght Chn: 2.21 mg/dL (ref 0.57–2.63)

## 2010-01-11 LAB — UIFE/LIGHT CHAINS/TP QN, 24-HR UR
Albumin, U: DETECTED
Alpha 1, Urine: DETECTED — AB
Alpha 2, Urine: DETECTED — AB
Beta, Urine: DETECTED — AB
Free Kappa Lt Chains,Ur: 5.61 mg/dL — ABNORMAL HIGH (ref 0.04–1.51)
Free Kappa/Lambda Ratio: 1.25 ratio (ref 0.46–4.00)
Free Lambda Excretion/Day: 35.84 mg/d
Free Lambda Lt Chains,Ur: 4.48 mg/dL — ABNORMAL HIGH (ref 0.08–1.01)
Free Lt Chn Excr Rate: 44.88 mg/d
Gamma Globulin, Urine: DETECTED — AB
Time: 24 hours
Total Protein, Urine-Ur/day: 99 mg/d (ref 10–140)
Total Protein, Urine: 12.4 mg/dL
Volume, Urine: 800 mL

## 2010-05-23 ENCOUNTER — Other Ambulatory Visit: Payer: Self-pay | Admitting: Hematology & Oncology

## 2010-05-25 ENCOUNTER — Other Ambulatory Visit: Payer: Self-pay | Admitting: Hematology & Oncology

## 2010-05-25 ENCOUNTER — Encounter (HOSPITAL_BASED_OUTPATIENT_CLINIC_OR_DEPARTMENT_OTHER): Payer: BC Managed Care – PPO | Admitting: Hematology & Oncology

## 2010-05-25 DIAGNOSIS — C9000 Multiple myeloma not having achieved remission: Secondary | ICD-10-CM

## 2010-05-25 LAB — CBC WITH DIFFERENTIAL (CANCER CENTER ONLY)
BASO#: 0.1 10*3/uL (ref 0.0–0.2)
BASO%: 2.2 % — ABNORMAL HIGH (ref 0.0–2.0)
EOS%: 6.1 % (ref 0.0–7.0)
Eosinophils Absolute: 0.3 10*3/uL (ref 0.0–0.5)
HCT: 35.9 % (ref 34.8–46.6)
HGB: 12 g/dL (ref 11.6–15.9)
LYMPH#: 1.4 10*3/uL (ref 0.9–3.3)
LYMPH%: 30.9 % (ref 14.0–48.0)
MCH: 25.7 pg — ABNORMAL LOW (ref 26.0–34.0)
MCHC: 33.4 g/dL (ref 32.0–36.0)
MCV: 77 fL — ABNORMAL LOW (ref 81–101)
MONO#: 0.5 10*3/uL (ref 0.1–0.9)
MONO%: 10.9 % (ref 0.0–13.0)
NEUT#: 2.3 10*3/uL (ref 1.5–6.5)
NEUT%: 49.9 % (ref 39.6–80.0)
Platelets: 222 10*3/uL (ref 145–400)
RBC: 4.67 10*6/uL (ref 3.70–5.32)
RDW: 16.4 % — ABNORMAL HIGH (ref 11.1–15.7)
WBC: 4.6 10*3/uL (ref 3.9–10.0)

## 2010-05-26 LAB — UIFE/LIGHT CHAINS/TP QN, 24-HR UR
Albumin, U: DETECTED
Alpha 1, Urine: DETECTED — AB
Alpha 2, Urine: DETECTED — AB
Beta, Urine: DETECTED — AB
Free Kappa Lt Chains,Ur: 7.58 mg/dL — ABNORMAL HIGH (ref 0.04–1.51)
Free Kappa/Lambda Ratio: 1.76 ratio (ref 0.46–4.00)
Free Lambda Excretion/Day: 51.6 mg/d
Free Lambda Lt Chains,Ur: 4.3 mg/dL — ABNORMAL HIGH (ref 0.08–1.01)
Free Lt Chn Excr Rate: 90.96 mg/d
Gamma Globulin, Urine: DETECTED — AB
Time: 24 hours
Total Protein, Urine-Ur/day: 156 mg/d — ABNORMAL HIGH (ref 10–140)
Total Protein, Urine: 13 mg/dL
Volume, Urine: 1200 mL

## 2010-05-27 LAB — COMPREHENSIVE METABOLIC PANEL
ALT: 29 U/L (ref 0–35)
AST: 17 U/L (ref 0–37)
Albumin: 3.9 g/dL (ref 3.5–5.2)
Alkaline Phosphatase: 56 U/L (ref 39–117)
BUN: 16 mg/dL (ref 6–23)
CO2: 20 mEq/L (ref 19–32)
Calcium: 8.9 mg/dL (ref 8.4–10.5)
Chloride: 107 mEq/L (ref 96–112)
Creatinine, Ser: 0.83 mg/dL (ref 0.40–1.20)
Glucose, Bld: 79 mg/dL (ref 70–99)
Potassium: 3.5 mEq/L (ref 3.5–5.3)
Sodium: 142 mEq/L (ref 135–145)
Total Bilirubin: 0.5 mg/dL (ref 0.3–1.2)
Total Protein: 6.3 g/dL (ref 6.0–8.3)

## 2010-05-27 LAB — PROTEIN ELECTROPHORESIS, SERUM
Albumin ELP: 58.1 % (ref 55.8–66.1)
Alpha-1-Globulin: 3.6 % (ref 2.9–4.9)
Alpha-2-Globulin: 9.5 % (ref 7.1–11.8)
Beta 2: 6.2 % (ref 3.2–6.5)
Beta Globulin: 7.4 % — ABNORMAL HIGH (ref 4.7–7.2)
Gamma Globulin: 15.2 % (ref 11.1–18.8)
Total Protein, Serum Electrophoresis: 6.3 g/dL (ref 6.0–8.3)

## 2010-05-27 LAB — KAPPA/LAMBDA LIGHT CHAINS
Kappa free light chain: 1.15 mg/dL (ref 0.33–1.94)
Kappa:Lambda Ratio: 0.34 (ref 0.26–1.65)
Lambda Free Lght Chn: 3.36 mg/dL — ABNORMAL HIGH (ref 0.57–2.63)

## 2010-05-27 LAB — BETA 2 MICROGLOBULIN, SERUM: Beta-2 Microglobulin: 1.22 mg/L (ref 1.01–1.73)

## 2010-06-17 NOTE — Op Note (Signed)
Emily Greer, Emily Greer NO.:  [id number]   MEDICAL RECORD NO.:  [id number]          PATIENT TYPE:  OUT   LOCATION:  [facility]                         FACILITY:  [facility]   PHYSICIAN:  Rudell Cobb. Marin Olp, M.D. DATE OF BIRTH:  1950-10-15   DATE OF PROCEDURE:  04/25/2005  DATE OF DISCHARGE:                                 OPERATIVE REPORT   NATURE OF PROCEDURE:  Left posterior iliac crest bone marrow biopsy and  aspirate.   [name] is brought to the short stay unit.  She had an IV placed  without difficulty.  She was then placed onto her right side.  She received  a total of 7 mg of Versed and 25 mg of Demerol for IV sedation.   The posterior iliac crest region was prepped and draped in a sterile  fashion.  Then 10 cc of 2% lidocaine was infiltrated under the skin down to  the periosteum.  I did have to use a 20 gauge spinal needle for this.  A #11  scalpel was used to make an incision through the skin.  Two bone marrow  aspirates were obtained without difficulty.   A second incision was made to the skin.  A long Jamshidi bone marrow biopsy  needle was used to obtain a good bone marrow biopsy core.   The patient tolerated the procedure well.  There were no complications.      Rudell Cobb. Marin Olp, M.D.  Electronically Signed     PRE/MEDQ  D:  04/25/2005  T:  04/26/2005  Job:  [id number]

## 2010-06-17 NOTE — Procedures (Signed)
[facility]  Patient:    Emily Greer, Emily Greer                    MRN: [id number] Proc. Date: 01/02/00 Adm. Date:  [id number] Attending:  Ernie Avena CC:         Seymour Bars. Leo Grosser, M.D.  Rudell Cobb. Marin Olp, M.D.  Don Broach. Carlis Abbott, M.D.   Procedure Report  PROCEDURE:  Colonoscopy.  ENDOSCOPIST:  Cleotis Nipper, M.D.  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  FINDINGS:  Normal exam to the cecum.  INFORMED CONSENT:  The nature, purpose and risks of the procedure had been discussed with the patient who provided written consent.  DESCRIPTION OF PROCEDURE:  Sedation was fentanyl 50 mcg and Versed 6 mg IV without arrhythmias or desaturations.  The Olympus video colonoscope was advanced to the cecum without undue difficulty, turning the patient in the supine position to help facilitate passage.  Pullback was then performed.  The colonic prep was very good, and it was felt that all areas were well seen.  This was basically a normal examination.  There was a question of a little bit of loss of vascularity in the distal rectum, but this was probably prep-related.  No polyps, cancer, colitis, vascular malformations, or diverticular disease were appreciated.  The cecum had been identified by visualization of the ileocecal valve and what I believe was the appendiceal orifice.  No biopsies were obtained during this procedure.  Retroflexion of the rectum was not performed due to a small rectal ampulla but careful antegrade viewing disclosed only a little bit of loss of vascularity and a question of some granularity in the distal rectum, probably not clinically significant.  The patient tolerated the procedure well.  There were no apparent complications.  IMPRESSION:  Normal screening  colonoscopy.  PLAN:  Follow-up colonoscopy in five years in view of the family history of colon cancer. DD:  01/02/00 TD:  01/02/00 Job: [id number]

## 2010-07-26 ENCOUNTER — Other Ambulatory Visit: Payer: Self-pay | Admitting: Obstetrics and Gynecology

## 2010-07-26 DIAGNOSIS — D219 Benign neoplasm of connective and other soft tissue, unspecified: Secondary | ICD-10-CM | POA: Insufficient documentation

## 2010-07-26 DIAGNOSIS — Z1231 Encounter for screening mammogram for malignant neoplasm of breast: Secondary | ICD-10-CM

## 2010-08-05 ENCOUNTER — Ambulatory Visit
Admission: RE | Admit: 2010-08-05 | Discharge: 2010-08-05 | Disposition: A | Discharge status: Home / Self Care | Payer: BC Managed Care – PPO | Source: Ambulatory Visit | Attending: Obstetrics and Gynecology | Admitting: Obstetrics and Gynecology

## 2010-08-05 DIAGNOSIS — Z1231 Encounter for screening mammogram for malignant neoplasm of breast: Secondary | ICD-10-CM

## 2010-09-08 ENCOUNTER — Other Ambulatory Visit: Payer: Self-pay | Admitting: Hematology & Oncology

## 2010-09-08 LAB — CBC WITH DIFFERENTIAL (CANCER CENTER ONLY)
BASO#: 0.1 10*3/uL (ref 0.0–0.2)
BASO%: 1.7 % (ref 0.0–2.0)
EOS%: 3.6 % (ref 0.0–7.0)
Eosinophils Absolute: 0.2 10*3/uL (ref 0.0–0.5)
HCT: 33.6 % — ABNORMAL LOW (ref 34.8–46.6)
HGB: 11.6 g/dL (ref 11.6–15.9)
LYMPH#: 1.3 10*3/uL (ref 0.9–3.3)
LYMPH%: 31.1 % (ref 14.0–48.0)
MCH: 26.5 pg (ref 26.0–34.0)
MCHC: 34.5 g/dL (ref 32.0–36.0)
MCV: 77 fL — ABNORMAL LOW (ref 81–101)
MONO#: 0.3 10*3/uL (ref 0.1–0.9)
MONO%: 6.6 % (ref 0.0–13.0)
NEUT#: 2.4 10*3/uL (ref 1.5–6.5)
NEUT%: 57 % (ref 39.6–80.0)
Platelets: 241 10*3/uL (ref 145–400)
RBC: 4.37 10*6/uL (ref 3.70–5.32)
RDW: 15.8 % — ABNORMAL HIGH (ref 11.1–15.7)
WBC: 4.1 10*3/uL (ref 3.9–10.0)

## 2010-09-09 ENCOUNTER — Encounter (HOSPITAL_BASED_OUTPATIENT_CLINIC_OR_DEPARTMENT_OTHER): Payer: BC Managed Care – PPO | Admitting: Hematology & Oncology

## 2010-09-09 DIAGNOSIS — C9000 Multiple myeloma not having achieved remission: Secondary | ICD-10-CM

## 2010-09-12 LAB — COMPREHENSIVE METABOLIC PANEL
ALT: 26 U/L (ref 0–35)
AST: 19 U/L (ref 0–37)
Albumin: 4.3 g/dL (ref 3.5–5.2)
Alkaline Phosphatase: 50 U/L (ref 39–117)
BUN: 18 mg/dL (ref 6–23)
CO2: 26 mEq/L (ref 19–32)
Calcium: 9.6 mg/dL (ref 8.4–10.5)
Chloride: 106 mEq/L (ref 96–112)
Creatinine, Ser: 0.96 mg/dL (ref 0.50–1.10)
Glucose, Bld: 84 mg/dL (ref 70–99)
Potassium: 3.8 mEq/L (ref 3.5–5.3)
Sodium: 138 mEq/L (ref 135–145)
Total Bilirubin: 0.3 mg/dL (ref 0.3–1.2)
Total Protein: 6.5 g/dL (ref 6.0–8.3)

## 2010-09-12 LAB — UIFE/LIGHT CHAINS/TP QN, 24-HR UR
Albumin, U: DETECTED
Alpha 1, Urine: DETECTED — AB
Alpha 2, Urine: DETECTED — AB
Beta, Urine: DETECTED — AB
Free Kappa Lt Chains,Ur: 6.43 mg/dL — ABNORMAL HIGH (ref 0.14–2.42)
Free Kappa/Lambda Ratio: 1.05 ratio — ABNORMAL LOW (ref 2.04–10.37)
Free Lambda Excretion/Day: 76.38 mg/d
Free Lambda Lt Chains,Ur: 6.11 mg/dL — ABNORMAL HIGH (ref 0.02–0.67)
Free Lt Chn Excr Rate: 80.38 mg/d
Gamma Globulin, Urine: DETECTED — AB
Time: 24 hours
Total Protein, Urine-Ur/day: 174 mg/d — ABNORMAL HIGH (ref 10–140)
Total Protein, Urine: 13.9 mg/dL
Volume, Urine: 1250 mL

## 2010-09-12 LAB — SPEP & IFE WITH QIG
Albumin ELP: 59.6 % (ref 55.8–66.1)
Alpha-1-Globulin: 3.5 % (ref 2.9–4.9)
Alpha-2-Globulin: 9.3 % (ref 7.1–11.8)
Beta 2: 6 % (ref 3.2–6.5)
Beta Globulin: 7.2 % (ref 4.7–7.2)
Gamma Globulin: 14.4 % (ref 11.1–18.8)
IgA: 280 mg/dL (ref 68–380)
IgG (Immunoglobin G), Serum: 959 mg/dL (ref 690–1700)
IgM, Serum: 26 mg/dL — ABNORMAL LOW (ref 52–322)
Total Protein, Serum Electrophoresis: 6.5 g/dL (ref 6.0–8.3)

## 2010-09-12 LAB — KAPPA/LAMBDA LIGHT CHAINS
Kappa free light chain: 1.62 mg/dL (ref 0.33–1.94)
Kappa:Lambda Ratio: 0.4 (ref 0.26–1.65)
Lambda Free Lght Chn: 4.08 mg/dL — ABNORMAL HIGH (ref 0.57–2.63)

## 2011-01-06 ENCOUNTER — Telehealth: Payer: Self-pay | Admitting: *Deleted

## 2011-01-06 NOTE — Telephone Encounter (Signed)
Pt moved 12-21 to 12-17

## 2011-01-06 NOTE — Telephone Encounter (Signed)
Left message moved 12-17 to 12-18

## 2011-01-09 ENCOUNTER — Other Ambulatory Visit: Payer: Self-pay | Admitting: Hematology & Oncology

## 2011-01-10 ENCOUNTER — Telehealth: Payer: Self-pay | Admitting: *Deleted

## 2011-01-10 NOTE — Telephone Encounter (Signed)
Left pt message with 12-12 and 12-18 appointments verified by MD

## 2011-01-11 ENCOUNTER — Other Ambulatory Visit (HOSPITAL_BASED_OUTPATIENT_CLINIC_OR_DEPARTMENT_OTHER): Payer: BC Managed Care – PPO | Admitting: Lab

## 2011-01-11 ENCOUNTER — Ambulatory Visit: Payer: BC Managed Care – PPO | Admitting: Hematology & Oncology

## 2011-01-11 ENCOUNTER — Other Ambulatory Visit: Payer: Self-pay | Admitting: Hematology & Oncology

## 2011-01-11 ENCOUNTER — Other Ambulatory Visit: Payer: BC Managed Care – PPO | Admitting: Lab

## 2011-01-11 DIAGNOSIS — C9000 Multiple myeloma not having achieved remission: Secondary | ICD-10-CM

## 2011-01-11 LAB — CBC WITH DIFFERENTIAL (CANCER CENTER ONLY)
BASO#: 0.1 10*3/uL (ref 0.0–0.2)
BASO%: 1.8 % (ref 0.0–2.0)
EOS%: 7.6 % — ABNORMAL HIGH (ref 0.0–7.0)
Eosinophils Absolute: 0.3 10*3/uL (ref 0.0–0.5)
HCT: 34.2 % — ABNORMAL LOW (ref 34.8–46.6)
HGB: 11.5 g/dL — ABNORMAL LOW (ref 11.6–15.9)
LYMPH#: 1.3 10*3/uL (ref 0.9–3.3)
LYMPH%: 28.3 % (ref 14.0–48.0)
MCH: 26.9 pg (ref 26.0–34.0)
MCHC: 33.6 g/dL (ref 32.0–36.0)
MCV: 80 fL — ABNORMAL LOW (ref 81–101)
MONO#: 0.4 10*3/uL (ref 0.1–0.9)
MONO%: 8.3 % (ref 0.0–13.0)
NEUT#: 2.4 10*3/uL (ref 1.5–6.5)
NEUT%: 54 % (ref 39.6–80.0)
Platelets: 250 10*3/uL (ref 145–400)
RBC: 4.28 10*6/uL (ref 3.70–5.32)
RDW: 16.1 % — ABNORMAL HIGH (ref 11.1–15.7)
WBC: 4.5 10*3/uL (ref 3.9–10.0)

## 2011-01-11 LAB — UIFE/LIGHT CHAINS/TP QN, 24-HR UR
Albumin, U: DETECTED
Alpha 1, Urine: DETECTED — AB
Alpha 2, Urine: DETECTED — AB
Beta, Urine: DETECTED — AB
Free Kappa Lt Chains,Ur: 2.36 mg/dL (ref 0.14–2.42)
Free Kappa/Lambda Ratio: 1 ratio — ABNORMAL LOW (ref 2.04–10.37)
Free Lambda Excretion/Day: 51.92 mg/d
Free Lambda Lt Chains,Ur: 2.36 mg/dL — ABNORMAL HIGH (ref 0.02–0.67)
Free Lt Chn Excr Rate: 51.92 mg/d
Gamma Globulin, Urine: DETECTED — AB
Time: 24 hours
Total Protein, Urine-Ur/day: 114 mg/d (ref 10–140)
Total Protein, Urine: 5.2 mg/dL
Volume, Urine: 2200 mL

## 2011-01-13 LAB — KAPPA/LAMBDA LIGHT CHAINS
Kappa free light chain: 1.97 mg/dL — ABNORMAL HIGH (ref 0.33–1.94)
Kappa:Lambda Ratio: 0.56 (ref 0.26–1.65)
Lambda Free Lght Chn: 3.49 mg/dL — ABNORMAL HIGH (ref 0.57–2.63)

## 2011-01-13 LAB — COMPREHENSIVE METABOLIC PANEL
ALT: 25 U/L (ref 0–35)
AST: 18 U/L (ref 0–37)
Albumin: 3.8 g/dL (ref 3.5–5.2)
Alkaline Phosphatase: 49 U/L (ref 39–117)
BUN: 17 mg/dL (ref 6–23)
CO2: 23 mEq/L (ref 19–32)
Calcium: 9.4 mg/dL (ref 8.4–10.5)
Chloride: 106 mEq/L (ref 96–112)
Creatinine, Ser: 0.98 mg/dL (ref 0.50–1.10)
Glucose, Bld: 85 mg/dL (ref 70–99)
Potassium: 3.9 mEq/L (ref 3.5–5.3)
Sodium: 140 mEq/L (ref 135–145)
Total Bilirubin: 0.3 mg/dL (ref 0.3–1.2)
Total Protein: 6.5 g/dL (ref 6.0–8.3)

## 2011-01-13 LAB — SPEP & IFE WITH QIG
Albumin ELP: 57.2 % (ref 55.8–66.1)
Alpha-1-Globulin: 3.7 % (ref 2.9–4.9)
Alpha-2-Globulin: 10.3 % (ref 7.1–11.8)
Beta 2: 6.6 % — ABNORMAL HIGH (ref 3.2–6.5)
Beta Globulin: 7.4 % — ABNORMAL HIGH (ref 4.7–7.2)
Gamma Globulin: 14.8 % (ref 11.1–18.8)
IgA: 339 mg/dL (ref 69–380)
IgG (Immunoglobin G), Serum: 1050 mg/dL (ref 690–1700)
IgM, Serum: 54 mg/dL (ref 52–322)
Total Protein, Serum Electrophoresis: 6.5 g/dL (ref 6.0–8.3)

## 2011-01-13 LAB — LACTATE DEHYDROGENASE: LDH: 150 U/L (ref 94–250)

## 2011-01-16 ENCOUNTER — Ambulatory Visit: Payer: BC Managed Care – PPO | Admitting: Hematology & Oncology

## 2011-01-16 ENCOUNTER — Ambulatory Visit: Payer: BC Managed Care – PPO

## 2011-01-16 ENCOUNTER — Encounter: Payer: Self-pay | Admitting: *Deleted

## 2011-01-17 ENCOUNTER — Ambulatory Visit (HOSPITAL_BASED_OUTPATIENT_CLINIC_OR_DEPARTMENT_OTHER): Payer: BC Managed Care – PPO | Admitting: Hematology & Oncology

## 2011-01-17 ENCOUNTER — Ambulatory Visit (HOSPITAL_BASED_OUTPATIENT_CLINIC_OR_DEPARTMENT_OTHER): Payer: BC Managed Care – PPO

## 2011-01-17 VITALS — BP 146/84 | HR 89 | Temp 97.5°F | Ht 62.5 in | Wt 180.0 lb

## 2011-01-17 DIAGNOSIS — C9000 Multiple myeloma not having achieved remission: Secondary | ICD-10-CM

## 2011-01-17 MED ORDER — SODIUM CHLORIDE 0.9 % IV SOLN
90.0000 mg | Freq: Once | INTRAVENOUS | Status: AC
  Administered 2011-01-17: 90 mg via INTRAVENOUS
  Filled 2011-01-17: qty 500

## 2011-01-17 MED ORDER — SODIUM CHLORIDE 0.9 % IV SOLN
Freq: Once | INTRAVENOUS | Status: AC
  Administered 2011-01-17: 10:00:00 via INTRAVENOUS

## 2011-01-17 NOTE — Patient Instructions (Signed)
Call office for temp >100.5

## 2011-01-17 NOTE — Progress Notes (Signed)
This office note has been dictated.
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
## 2011-01-19 ENCOUNTER — Other Ambulatory Visit: Payer: Self-pay | Admitting: *Deleted

## 2011-01-20 ENCOUNTER — Other Ambulatory Visit: Payer: BC Managed Care – PPO | Admitting: Lab

## 2011-01-20 ENCOUNTER — Ambulatory Visit: Payer: BC Managed Care – PPO

## 2011-01-20 ENCOUNTER — Ambulatory Visit: Payer: BC Managed Care – PPO | Admitting: Hematology & Oncology

## 2011-01-23 ENCOUNTER — Encounter: Payer: Self-pay | Admitting: *Deleted

## 2011-01-23 NOTE — Progress Notes (Signed)
Received notification that patients revlimid was shipped on Dec 21st to the patients home

## 2011-02-14 ENCOUNTER — Other Ambulatory Visit: Payer: Self-pay | Admitting: *Deleted

## 2011-02-14 MED ORDER — LENALIDOMIDE 15 MG PO CAPS: 15.0000 mg | ORAL_CAPSULE | Freq: Every day | ORAL | Status: DC

## 2011-02-14 NOTE — Telephone Encounter (Signed)
Received a refill request from [facility] for Revlimid 15 mg q21 days with 7 days break. Reviewed Dr Dicie Beam last note. No change in dose or frequency. Faxed back once signed by Dr Marin Olp [phone]

## 2011-03-15 ENCOUNTER — Other Ambulatory Visit: Payer: Self-pay | Admitting: *Deleted

## 2011-03-15 MED ORDER — LENALIDOMIDE 15 MG PO CAPS: 15.0000 mg | ORAL_CAPSULE | Freq: Every day | ORAL | Status: DC

## 2011-03-15 NOTE — Telephone Encounter (Signed)
Rx faxed to [facility] [phone].

## 2011-04-07 ENCOUNTER — Other Ambulatory Visit: Payer: Self-pay | Admitting: *Deleted

## 2011-04-07 MED ORDER — LENALIDOMIDE 15 MG PO CAPS: 15.0000 mg | ORAL_CAPSULE | Freq: Every day | ORAL | Status: DC

## 2011-04-07 NOTE — Telephone Encounter (Signed)
Received refill request for Revlimid from [facility]. Reviewed note from Dr Marin Olp. Per note, no change in dose or frequency.

## 2011-04-17 ENCOUNTER — Telehealth: Payer: Self-pay | Admitting: Hematology & Oncology

## 2011-04-17 ENCOUNTER — Other Ambulatory Visit (HOSPITAL_BASED_OUTPATIENT_CLINIC_OR_DEPARTMENT_OTHER): Payer: BC Managed Care – PPO | Admitting: Lab

## 2011-04-17 NOTE — Telephone Encounter (Signed)
Pt came in to drop off urine, she also had apt for 8:15.  She cx apt and resch for 04/20/11 at 8am

## 2011-04-18 LAB — UPEP/TP, 24-HR URINE
Collection Interval: 24 hours
Total Protein, Urine/Day: 62 mg/d (ref 50–100)
Total Protein, Urine: 4 mg/dL
Total Volume, Urine: 1550 mL

## 2011-04-19 ENCOUNTER — Other Ambulatory Visit: Payer: BC Managed Care – PPO | Admitting: Lab

## 2011-04-20 ENCOUNTER — Other Ambulatory Visit: Payer: BC Managed Care – PPO | Admitting: Lab

## 2011-04-20 DIAGNOSIS — C9000 Multiple myeloma not having achieved remission: Secondary | ICD-10-CM

## 2011-04-20 LAB — CBC WITH DIFFERENTIAL (CANCER CENTER ONLY)
BASO#: 0.1 10*3/uL (ref 0.0–0.2)
BASO%: 1.7 % (ref 0.0–2.0)
EOS%: 7.1 % — ABNORMAL HIGH (ref 0.0–7.0)
Eosinophils Absolute: 0.3 10*3/uL (ref 0.0–0.5)
HCT: 34.3 % — ABNORMAL LOW (ref 34.8–46.6)
HGB: 11.6 g/dL (ref 11.6–15.9)
LYMPH#: 1 10*3/uL (ref 0.9–3.3)
LYMPH%: 28.3 % (ref 14.0–48.0)
MCH: 26.9 pg (ref 26.0–34.0)
MCHC: 33.8 g/dL (ref 32.0–36.0)
MCV: 79 fL — ABNORMAL LOW (ref 81–101)
MONO#: 0.5 10*3/uL (ref 0.1–0.9)
MONO%: 13.9 % — ABNORMAL HIGH (ref 0.0–13.0)
NEUT#: 1.7 10*3/uL (ref 1.5–6.5)
NEUT%: 49 % (ref 39.6–80.0)
Platelets: 183 10*3/uL (ref 145–400)
RBC: 4.32 10*6/uL (ref 3.70–5.32)
RDW: 16.2 % — ABNORMAL HIGH (ref 11.1–15.7)
WBC: 3.5 10*3/uL — ABNORMAL LOW (ref 3.9–10.0)

## 2011-04-20 LAB — UIFE/LIGHT CHAINS/TP QN, 24-HR UR
Albumin, U: DETECTED
Alpha 1, Urine: DETECTED — AB
Alpha 2, Urine: DETECTED — AB
Beta, Urine: DETECTED — AB
Free Kappa Lt Chains,Ur: 3.14 mg/dL — ABNORMAL HIGH (ref 0.14–2.42)
Free Kappa/Lambda Ratio: 0.78 ratio — ABNORMAL LOW (ref 2.04–10.37)
Free Lambda Excretion/Day: 62.78 mg/d
Free Lambda Lt Chains,Ur: 4.05 mg/dL — ABNORMAL HIGH (ref 0.02–0.67)
Free Lt Chn Excr Rate: 48.67 mg/d
Gamma Globulin, Urine: DETECTED — AB
Time: 24 hours
Total Protein, Urine-Ur/day: 121 mg/d (ref 10–140)
Total Protein, Urine: 7.8 mg/dL
Volume, Urine: 1550 mL

## 2011-04-20 LAB — TECHNOLOGIST REVIEW CHCC SATELLITE: Tech Review: ADEQUATE

## 2011-04-24 LAB — IGG, IGA, IGM
IgA: 288 mg/dL (ref 69–380)
IgG (Immunoglobin G), Serum: 988 mg/dL (ref 690–1700)
IgM, Serum: 32 mg/dL — ABNORMAL LOW (ref 52–322)

## 2011-04-24 LAB — COMPREHENSIVE METABOLIC PANEL
ALT: 22 U/L (ref 0–35)
AST: 18 U/L (ref 0–37)
Albumin: 4.1 g/dL (ref 3.5–5.2)
Alkaline Phosphatase: 49 U/L (ref 39–117)
BUN: 18 mg/dL (ref 6–23)
CO2: 27 mEq/L (ref 19–32)
Calcium: 9.4 mg/dL (ref 8.4–10.5)
Chloride: 104 mEq/L (ref 96–112)
Creatinine, Ser: 1.01 mg/dL (ref 0.50–1.10)
Glucose, Bld: 85 mg/dL (ref 70–99)
Potassium: 4.1 mEq/L (ref 3.5–5.3)
Sodium: 141 mEq/L (ref 135–145)
Total Bilirubin: 0.5 mg/dL (ref 0.3–1.2)
Total Protein: 6.4 g/dL (ref 6.0–8.3)

## 2011-04-24 LAB — KAPPA/LAMBDA LIGHT CHAINS
Kappa free light chain: 1.94 mg/dL (ref 0.33–1.94)
Kappa:Lambda Ratio: 0.41 (ref 0.26–1.65)
Lambda Free Lght Chn: 4.73 mg/dL — ABNORMAL HIGH (ref 0.57–2.63)

## 2011-04-24 LAB — PROTEIN ELECTROPHORESIS, SERUM, WITH REFLEX
Albumin ELP: 57.3 % (ref 55.8–66.1)
Alpha-1-Globulin: 3.8 % (ref 2.9–4.9)
Alpha-2-Globulin: 10.2 % (ref 7.1–11.8)
Beta 2: 6.6 % — ABNORMAL HIGH (ref 3.2–6.5)
Beta Globulin: 7.3 % — ABNORMAL HIGH (ref 4.7–7.2)
Gamma Globulin: 14.8 % (ref 11.1–18.8)
Total Protein, Serum Electrophoresis: 6.4 g/dL (ref 6.0–8.3)

## 2011-05-01 ENCOUNTER — Telehealth: Payer: Self-pay | Admitting: *Deleted

## 2011-05-01 NOTE — Telephone Encounter (Addendum)
Message copied by Orlando Penner on Mon May 01, 2011  4:31 PM ------      Message from: Burney Gauze R      Created: Thu Apr 27, 2011  9:15 PM       Call:  Labs look stable. Myeloma is about the same.  peete  This message given to pt.

## 2011-05-03 ENCOUNTER — Ambulatory Visit (HOSPITAL_BASED_OUTPATIENT_CLINIC_OR_DEPARTMENT_OTHER): Payer: BC Managed Care – PPO

## 2011-05-03 ENCOUNTER — Ambulatory Visit (HOSPITAL_BASED_OUTPATIENT_CLINIC_OR_DEPARTMENT_OTHER): Payer: BC Managed Care – PPO | Admitting: Hematology & Oncology

## 2011-05-03 VITALS — BP 158/85 | HR 74 | Temp 97.6°F | Ht 62.0 in | Wt 182.0 lb

## 2011-05-03 DIAGNOSIS — C9000 Multiple myeloma not having achieved remission: Secondary | ICD-10-CM

## 2011-05-03 MED ORDER — SODIUM CHLORIDE 0.9 % IV SOLN
Freq: Once | INTRAVENOUS | Status: AC
  Administered 2011-05-03: 11:00:00 via INTRAVENOUS

## 2011-05-03 MED ORDER — SODIUM CHLORIDE 0.9 % IV SOLN
90.0000 mg | Freq: Once | INTRAVENOUS | Status: AC
  Administered 2011-05-03: 90 mg via INTRAVENOUS
  Filled 2011-05-03: qty 500

## 2011-05-03 NOTE — Progress Notes (Signed)
This office note has been dictated.

## 2011-05-03 NOTE — Progress Notes (Signed)
CC:   Jeanann Lewandowsky, M.D. Eldred Manges, M.D. Jeanann Lewandowsky, MD  DIAGNOSIS:  Recurrent lambda light chain myeloma.  CURRENT THERAPY: 1. Revlimid 15 mg p.o. daily (21 days on/7 days off). 2. Aredia 90 mg IV q.4 months.  INTERVAL HISTORY:  Emily Greer comes in for 4 month followup.  She is doing well.  She is still working.  She is not going to retire yet.  She enjoys being Vice-Principal too much.  She has had no problems with bony pain.  She is trying to watch her weight.  She is having no problem with bowels or bladder.  Her myeloma, to date, has not been an issue.  Her myeloma studies that were done in March showed her lambda light chain to be 4.73 mg/dL.  Her 24-hour urine showed 63 mg/day of lambda light chain.  She does not have a monoclonal spike in her serum.  She has normal renal function.  She had normal IgG and IgA levels.  She has had no problems with leg swelling.  She has had no rashes.  She has had no fevers, sweats, or chills.  PHYSICAL EXAM:  General: This is a well-developed, well-nourished, black female in no obvious distress.  Vital signs:  Show a temperature of 97.6, pulse 74, respiratory 18, blood pressure 158/85, and weight is 182.  Head and neck exam shows a normocephalic, atraumatic skull.  There are no ocular or oral lesions.  There are no palpable cervical or supraclavicular lymph nodes.  Lungs:  Clear to percussion and auscultation bilaterally.  Cardiac examination:  Regular rate and rhythm with a normal S1 and S2.  There are no murmurs, rubs, or bruits. Abdominal exam: Soft with good bowel sounds.  There is no palpable abdominal mass.  There is no fluid wave.  There is no palpable hepatosplenomegaly.  Extremities: Shows no clubbing, cyanosis, or edema. Neurological exam: Shows no focal neurological deficits.  LABORATORY STUDIES:  Show a white cell count of 3.5, hemoglobin 11.6, hematocrit 34.3, and platelet count 183.  MCV is  79.  IMPRESSION:  Emily Greer is a 61 year old African American female with lambda light chain myeloma.  She initially underwent stem cell transplantation back in 2002.  So far, she has done incredibly well.  I think her myeloma is still very low level.  We will continue her on the Revlimid for right now.  She will get her Aredia today.  I will plan to get her back in 4 more months, which is a good interval for [place] for her followup.    ______________________________ [name], M.D. PRE/MEDQ  D:  05/03/2011  T:  05/03/2011  Job:  [id number]

## 2011-05-04 ENCOUNTER — Telehealth: Payer: Self-pay | Admitting: Hematology & Oncology

## 2011-05-04 NOTE — Telephone Encounter (Signed)
Pt aware of 7-22 and 8-5 appointments

## 2011-05-11 ENCOUNTER — Other Ambulatory Visit: Payer: Self-pay | Admitting: *Deleted

## 2011-05-11 MED ORDER — LENALIDOMIDE 15 MG PO CAPS: 15.0000 mg | ORAL_CAPSULE | Freq: Every day | ORAL | Status: DC

## 2011-05-11 NOTE — Telephone Encounter (Signed)
Received refill request from [facility] for Revlimid 15 mg. She takes this 21 days on 7 days off. Will have Dr Marin Olp sign the rx and fax it back to [phone].

## 2011-06-05 ENCOUNTER — Telehealth: Payer: Self-pay | Admitting: Obstetrics and Gynecology

## 2011-06-05 NOTE — Telephone Encounter (Signed)
Emily Greer received

## 2011-06-06 ENCOUNTER — Other Ambulatory Visit: Payer: Self-pay | Admitting: *Deleted

## 2011-06-07 NOTE — Telephone Encounter (Signed)
Lm on vm for pt to cb per telephone message.

## 2011-06-07 NOTE — Telephone Encounter (Signed)
Tc from pt. Pt had vaginal bldg on 4-29 and 4-30 for those days only. Not accompanied by abd pain or fever. Pt is menopausal. Appt sched 06-29-11@3 :30p with vph for eval. Informed pt to call back if bldg  reoccur. Pt voices understanding.

## 2011-06-08 ENCOUNTER — Other Ambulatory Visit: Payer: Self-pay | Admitting: *Deleted

## 2011-06-08 MED ORDER — LENALIDOMIDE 15 MG PO CAPS: 15.0000 mg | ORAL_CAPSULE | Freq: Every day | ORAL | Status: DC

## 2011-06-08 NOTE — Telephone Encounter (Signed)
error 

## 2011-06-29 ENCOUNTER — Ambulatory Visit (INDEPENDENT_AMBULATORY_CARE_PROVIDER_SITE_OTHER): Payer: BC Managed Care – PPO | Admitting: Obstetrics and Gynecology

## 2011-06-29 ENCOUNTER — Encounter: Payer: Self-pay | Admitting: Obstetrics and Gynecology

## 2011-06-29 VITALS — BP 128/62 | Ht 64.0 in | Wt 181.0 lb

## 2011-06-29 DIAGNOSIS — D259 Leiomyoma of uterus, unspecified: Secondary | ICD-10-CM

## 2011-06-29 DIAGNOSIS — D219 Benign neoplasm of connective and other soft tissue, unspecified: Secondary | ICD-10-CM | POA: Insufficient documentation

## 2011-06-29 DIAGNOSIS — N952 Postmenopausal atrophic vaginitis: Secondary | ICD-10-CM

## 2011-06-29 DIAGNOSIS — R638 Other symptoms and signs concerning food and fluid intake: Secondary | ICD-10-CM

## 2011-06-29 DIAGNOSIS — N950 Postmenopausal bleeding: Secondary | ICD-10-CM

## 2011-06-29 DIAGNOSIS — C9000 Multiple myeloma not having achieved remission: Secondary | ICD-10-CM

## 2011-06-29 LAB — POCT URINE PREGNANCY: Preg Test, Ur: NEGATIVE

## 2011-06-29 MED ORDER — MISOPROSTOL 200 MCG PO TABS: ORAL_TABLET | ORAL | Status: DC

## 2011-06-29 NOTE — Progress Notes (Signed)
The patient is a 61 year old black married female gravida 3 para 2 who has multiple myeloma and has a long history of uterine fibroids. She has been postmenopausal for many years. Around a month ago she noted approximately 2 days of very light menstrual bleeding. This was not associated with intercourse however the next time that she did have intercourse she noticed that she had some bleeding after that. She does have a history in the past of atrophic vaginitis. Current contraception: condoms. Hormone replacement therapy: No New medication: Yes Azor  History of [id number]  History of infertility: no. History of abnormal Pap smear: no History of fibroids: Yes dx 1999  Increased stress: Yes   .  The patient's past medical history allergies medications family and surgical history have been reviewed.  Exam: BP 128/62  Ht 5\' 4"  (1.626 m)  Wt 181 lb (82.101 kg)  BMI 31.07 kg/m2 Abdomen: The abdomen is soft with a suprapubic mass consistent with fibroids which is nontender. No other organomegaly is noted Pelvic:  External genitalia without lesions   The vagina is atrophic with contact bleeding noted   The cervix is likewise atrophic with contact bleeding noted particularly on the anterior cervix   The uterus is enlarged to approximately 14-16 weeks size which is stable, with decreased mobility.   Adnexa : no separable masses can be palpated   Rectovaginal no separable masses can be palpated  Impression: Long-standing stable uterine fibroid Recent episode of postmenopausal bleeding and postcoital bleeding  Recommendation: Urine pregnancy test is negative A Pap smear is done of the cervix and of the anterior vaginal vault at the area of bleeding The patient will be scheduled for a sonohysterogram Pre-procedure cervical ripening will be done with Cytotec 200 mcg in the vagina 12 hours and then again at 6 hours prior to the procedure

## 2011-06-29 NOTE — Patient Instructions (Signed)
Cytotec 2 tabs given to pt.

## 2011-07-03 LAB — PAP IG AND HPV HIGH-RISK
HPV DNA High Risk: NOT DETECTED
HPV DNA High Risk: NOT DETECTED

## 2011-08-01 ENCOUNTER — Other Ambulatory Visit: Payer: Self-pay | Admitting: *Deleted

## 2011-08-01 MED ORDER — LENALIDOMIDE 15 MG PO CAPS: 15.0000 mg | ORAL_CAPSULE | Freq: Every day | ORAL | Status: DC

## 2011-08-01 NOTE — Telephone Encounter (Signed)
Received refill request from [facility] for Revlimid 15 mg. She takes this 21 days on 7 days off. Obtained auth # [id number]. Will have Dr Marin Olp sign the rx and fax it back to [phone].

## 2011-08-07 ENCOUNTER — Ambulatory Visit: Payer: BC Managed Care – PPO | Admitting: Obstetrics and Gynecology

## 2011-08-16 ENCOUNTER — Other Ambulatory Visit: Payer: Self-pay | Admitting: Obstetrics and Gynecology

## 2011-08-16 ENCOUNTER — Ambulatory Visit (INDEPENDENT_AMBULATORY_CARE_PROVIDER_SITE_OTHER): Payer: BC Managed Care – PPO

## 2011-08-16 ENCOUNTER — Ambulatory Visit (INDEPENDENT_AMBULATORY_CARE_PROVIDER_SITE_OTHER): Payer: BC Managed Care – PPO | Admitting: Obstetrics and Gynecology

## 2011-08-16 VITALS — BP 120/78 | Wt 185.0 lb

## 2011-08-16 DIAGNOSIS — C9000 Multiple myeloma not having achieved remission: Secondary | ICD-10-CM

## 2011-08-16 DIAGNOSIS — N950 Postmenopausal bleeding: Secondary | ICD-10-CM

## 2011-08-16 DIAGNOSIS — N920 Excessive and frequent menstruation with regular cycle: Secondary | ICD-10-CM

## 2011-08-16 DIAGNOSIS — D259 Leiomyoma of uterus, unspecified: Secondary | ICD-10-CM

## 2011-08-16 DIAGNOSIS — N858 Other specified noninflammatory disorders of uterus: Secondary | ICD-10-CM

## 2011-08-16 DIAGNOSIS — N9489 Other specified conditions associated with female genital organs and menstrual cycle: Secondary | ICD-10-CM

## 2011-08-16 DIAGNOSIS — D219 Benign neoplasm of connective and other soft tissue, unspecified: Secondary | ICD-10-CM

## 2011-08-16 LAB — POCT URINE PREGNANCY: Preg Test, Ur: NEGATIVE

## 2011-08-17 ENCOUNTER — Encounter: Payer: Self-pay | Admitting: Obstetrics and Gynecology

## 2011-08-17 ENCOUNTER — Telehealth: Payer: Self-pay

## 2011-08-17 ENCOUNTER — Other Ambulatory Visit: Payer: Self-pay

## 2011-08-17 NOTE — Progress Notes (Signed)
Post menopausal bleeding: Patient had continued to have an episode of bleeding which started approximately 5 days ago and is decreasing  Sonohysterogram: Uterus: 7 x 8.9 to my 8.33 cm   Multiple legs no largest measuring 8.2 cm in largest dimension.  Question of submucosal component and a midline fibroid Endometrium:11.2 mm Adnexa: Ovaries are not identified.  No adnexal abnormalities seen. Saline infusion: Can be accomplished secondary to obstruction.  No clear distention of the endometrial cavity couldn't be identified.  Endometrial biopsy: Attempted but could not be completed  Impression: Postmenopausal bleeding large uterine fibroids possibly obstructing endometrial access, Multiple myeloma previously in remission now under treatment  Recommendations: Pelvic MRI to better outline uterine fibroids and the endometrial component followup will be based on results Plan discussed with patient and her husband

## 2011-08-17 NOTE — Telephone Encounter (Signed)
Tc to pt per MRI of pelvis appt. Appt sched 08/23/11@4 :30p for a 5:15p@[facility]([address]). Spoke with Tonya(scheduler). Lm on vm for pt to call back to make aware.

## 2011-08-17 NOTE — Telephone Encounter (Signed)
Tc from pt per telephone call. Informed pt of MRI appt. Pt voices understanding.

## 2011-08-21 ENCOUNTER — Other Ambulatory Visit (HOSPITAL_BASED_OUTPATIENT_CLINIC_OR_DEPARTMENT_OTHER): Payer: BC Managed Care – PPO | Admitting: Lab

## 2011-08-21 ENCOUNTER — Other Ambulatory Visit: Payer: BC Managed Care – PPO | Admitting: Lab

## 2011-08-21 DIAGNOSIS — C9000 Multiple myeloma not having achieved remission: Secondary | ICD-10-CM

## 2011-08-21 LAB — CBC WITH DIFFERENTIAL (CANCER CENTER ONLY)
BASO#: 0.1 10*3/uL (ref 0.0–0.2)
BASO%: 1.8 % (ref 0.0–2.0)
EOS%: 1.2 % (ref 0.0–7.0)
Eosinophils Absolute: 0.1 10*3/uL (ref 0.0–0.5)
HCT: 32.2 % — ABNORMAL LOW (ref 34.8–46.6)
HGB: 10.9 g/dL — ABNORMAL LOW (ref 11.6–15.9)
LYMPH#: 1.1 10*3/uL (ref 0.9–3.3)
LYMPH%: 18.9 % (ref 14.0–48.0)
MCH: 27.1 pg (ref 26.0–34.0)
MCHC: 33.9 g/dL (ref 32.0–36.0)
MCV: 80 fL — ABNORMAL LOW (ref 81–101)
MONO#: 0.8 10*3/uL (ref 0.1–0.9)
MONO%: 13.6 % — ABNORMAL HIGH (ref 0.0–13.0)
NEUT#: 3.7 10*3/uL (ref 1.5–6.5)
NEUT%: 64.5 % (ref 39.6–80.0)
Platelets: 264 10*3/uL (ref 145–400)
RBC: 4.02 10*6/uL (ref 3.70–5.32)
RDW: 16.3 % — ABNORMAL HIGH (ref 11.1–15.7)
WBC: 5.7 10*3/uL (ref 3.9–10.0)

## 2011-08-23 ENCOUNTER — Ambulatory Visit
Admission: RE | Admit: 2011-08-23 | Discharge: 2011-08-23 | Disposition: A | Discharge status: Home / Self Care | Payer: BC Managed Care – PPO | Source: Ambulatory Visit | Attending: Obstetrics and Gynecology | Admitting: Obstetrics and Gynecology

## 2011-08-23 DIAGNOSIS — N858 Other specified noninflammatory disorders of uterus: Secondary | ICD-10-CM

## 2011-08-23 DIAGNOSIS — C9000 Multiple myeloma not having achieved remission: Secondary | ICD-10-CM

## 2011-08-23 DIAGNOSIS — N950 Postmenopausal bleeding: Secondary | ICD-10-CM

## 2011-08-23 LAB — PROTEIN ELECTROPHORESIS, SERUM, WITH REFLEX
Albumin ELP: 54.1 % — ABNORMAL LOW (ref 55.8–66.1)
Alpha-1-Globulin: 5.8 % — ABNORMAL HIGH (ref 2.9–4.9)
Alpha-2-Globulin: 13.7 % — ABNORMAL HIGH (ref 7.1–11.8)
Beta 2: 6.4 % (ref 3.2–6.5)
Beta Globulin: 7.1 % (ref 4.7–7.2)
Gamma Globulin: 12.9 % (ref 11.1–18.8)
Total Protein, Serum Electrophoresis: 6.6 g/dL (ref 6.0–8.3)

## 2011-08-23 LAB — UIFE/LIGHT CHAINS/TP QN, 24-HR UR
Albumin, U: DETECTED
Alpha 1, Urine: DETECTED — AB
Alpha 2, Urine: DETECTED — AB
Beta, Urine: DETECTED — AB
Free Kappa Lt Chains,Ur: 5.09 mg/dL — ABNORMAL HIGH (ref 0.14–2.42)
Free Kappa/Lambda Ratio: 0.39 ratio — ABNORMAL LOW (ref 2.04–10.37)
Free Lambda Excretion/Day: 195 mg/d
Free Lambda Lt Chains,Ur: 13 mg/dL — ABNORMAL HIGH (ref 0.02–0.67)
Free Lt Chn Excr Rate: 76.35 mg/d
Gamma Globulin, Urine: DETECTED — AB
Time: 24 hours
Total Protein, Urine-Ur/day: 288 mg/d — ABNORMAL HIGH (ref 10–140)
Total Protein, Urine: 19.2 mg/dL
Volume, Urine: 1500 mL

## 2011-08-23 LAB — COMPREHENSIVE METABOLIC PANEL
ALT: 53 U/L — ABNORMAL HIGH (ref 0–35)
AST: 40 U/L — ABNORMAL HIGH (ref 0–37)
Albumin: 4 g/dL (ref 3.5–5.2)
Alkaline Phosphatase: 70 U/L (ref 39–117)
BUN: 9 mg/dL (ref 6–23)
CO2: 29 mEq/L (ref 19–32)
Calcium: 9 mg/dL (ref 8.4–10.5)
Chloride: 102 mEq/L (ref 96–112)
Creatinine, Ser: 0.87 mg/dL (ref 0.50–1.10)
Glucose, Bld: 95 mg/dL (ref 70–99)
Potassium: 3.5 mEq/L (ref 3.5–5.3)
Sodium: 141 mEq/L (ref 135–145)
Total Bilirubin: 0.6 mg/dL (ref 0.3–1.2)
Total Protein: 6.6 g/dL (ref 6.0–8.3)

## 2011-08-23 LAB — KAPPA/LAMBDA LIGHT CHAINS
Kappa free light chain: 1.86 mg/dL (ref 0.33–1.94)
Kappa:Lambda Ratio: 0.33 (ref 0.26–1.65)
Lambda Free Lght Chn: 5.59 mg/dL — ABNORMAL HIGH (ref 0.57–2.63)

## 2011-08-23 LAB — IGG, IGA, IGM
IgA: 274 mg/dL (ref 69–380)
IgG (Immunoglobin G), Serum: 902 mg/dL (ref 690–1700)
IgM, Serum: 31 mg/dL — ABNORMAL LOW (ref 52–322)

## 2011-08-23 MED ORDER — GADOBENATE DIMEGLUMINE 529 MG/ML IV SOLN
17.0000 mL | Freq: Once | INTRAVENOUS | Status: AC | PRN
  Administered 2011-08-23: 17 mL via INTRAVENOUS

## 2011-08-25 ENCOUNTER — Telehealth: Payer: Self-pay

## 2011-08-25 ENCOUNTER — Other Ambulatory Visit: Payer: BC Managed Care – PPO

## 2011-08-25 DIAGNOSIS — N950 Postmenopausal bleeding: Secondary | ICD-10-CM

## 2011-08-25 NOTE — Telephone Encounter (Signed)
Tc to pt per test results. Told pt MRI-normal except dx of fibroids. Pt needs Estradiol and FSH labs and sched an appt in 1 week with vph to discuss. Appt sched today @3 :00 with lab only. Appt sched 09/05/11@4 :00p with vph. Pt voices understanding.

## 2011-08-26 LAB — ESTRADIOL: Estradiol: 12.2 pg/mL

## 2011-08-26 LAB — FOLLICLE STIMULATING HORMONE: FSH: 35 m[IU]/mL

## 2011-09-01 ENCOUNTER — Other Ambulatory Visit: Payer: Self-pay | Admitting: *Deleted

## 2011-09-01 DIAGNOSIS — O139 Gestational [pregnancy-induced] hypertension without significant proteinuria, unspecified trimester: Secondary | ICD-10-CM | POA: Insufficient documentation

## 2011-09-01 DIAGNOSIS — R7309 Other abnormal glucose: Secondary | ICD-10-CM | POA: Insufficient documentation

## 2011-09-04 ENCOUNTER — Ambulatory Visit (HOSPITAL_BASED_OUTPATIENT_CLINIC_OR_DEPARTMENT_OTHER): Payer: BC Managed Care – PPO | Admitting: Hematology & Oncology

## 2011-09-04 ENCOUNTER — Ambulatory Visit (HOSPITAL_BASED_OUTPATIENT_CLINIC_OR_DEPARTMENT_OTHER): Payer: BC Managed Care – PPO

## 2011-09-04 VITALS — BP 121/78 | HR 92 | Temp 97.4°F | Resp 18 | Wt 184.0 lb

## 2011-09-04 DIAGNOSIS — Z8579 Personal history of other malignant neoplasms of lymphoid, hematopoietic and related tissues: Secondary | ICD-10-CM

## 2011-09-04 DIAGNOSIS — C9000 Multiple myeloma not having achieved remission: Secondary | ICD-10-CM

## 2011-09-04 MED ORDER — SODIUM CHLORIDE 0.9 % IV SOLN
90.0000 mg | Freq: Once | INTRAVENOUS | Status: AC
  Administered 2011-09-04: 90 mg via INTRAVENOUS
  Filled 2011-09-04: qty 500

## 2011-09-04 NOTE — Progress Notes (Signed)
CC:   Emily Greer, M.D. Eldred Manges, M.D. Emily Lewandowsky, MD  DIAGNOSIS:  Recurrent lambda light chain myeloma.  CURRENT THERAPY: 1. Revlimid 15 mg p.o. daily (21 days on/7 days off). 2. Aredia 90 mg IV q.4 months.  INTERIM HISTORY:  Emily Greer comes in for followup.  She is having issues with uterine fibroids.  She sees Dr. Leo Grosser.  Dr. Leo Grosser did do an MRI.  The MRI was done of the pelvis.  The MRI did not show anything that looked suspicious from a malignant point of view.  This was done on July 24th.  She did have the fibroids that were noted.  There was nothing suspicious that looked malignant.  There were no adnexal masses. The ovaries looked "normal postmenopausal."  She has been having some bleeding.  Again, Dr. Leo Grosser is working on this.  She otherwise is doing okay physically.  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  When we checked her lab studies when before this visit, her 24-hour urine did show a spike of her protein.  Her lambda light chain was up to 195 mg per day.  This was up from 63 mg per day back in March.  In her serum, there is still no monoclonal spike noted.  Her immunoglobulins were still okay.  Her serum lambda free light chain was 5.6 mg/dL which was slightly elevated from previously.  She has had no bony pain.  She has had no cough.  She has had no leg swelling.  There have been no rashes.  PHYSICAL EXAMINATION:  General:  This is a well-developed, well- nourished, African American female in no obvious distress.  Vital Signs: Temperature of 97.4, pulse 92, respiratory rate 18, blood pressure 121/78.  Weight is 184.  Head and Neck:  Normocephalic, atraumatic skull.  There are no ocular or oral lesions.  There are no palpable cervical or supraclavicular lymph nodes.  Lungs:  Clear bilaterally. Cardiac:  Regular rate and rhythm with a normal S1, S2.  There are no murmurs, rubs, or  bruits.  Abdomen:  Soft with good bowel sounds.  There is no palpable abdominal mass.  There is no palpable hepatosplenomegaly. Back:  No tenderness over the spine, ribs, or hips.  Extremities:  No clubbing, cyanosis, or edema.  Neurological:  No focal neurological deficits.  LABORATORY STUDIES:  White cell count is 5.7, hemoglobin 10.9, hematocrit 32.2, platelet count is 264.  Her BUN is 9, creatinine 0.7. Calcium is 9.0 with an albumin of 4.0.  IMPRESSION:  Emily Greer is a 61 year old African American female with lambda light chain myeloma.  She had her transplant at [facility] back in 2002.  Again, she has done very, very well.  I think I am going to have to get her back in 2 months to repeat her 24- hour urine.  I think this would be the key as to whether or not we need to embark upon a more aggressive therapeutic approach.  One may also have to wonder if she might not be a candidate for another stem cell transplant.  She is only 60 years old.  She is still in very good health.  We will give her the Aredia today.  I believe this has been a big plus for her.  I just feel bad that we might be seeing recurrence of the myeloma. However, we will pursue this aggressively as she still has had a very good performance status  and still is quite young.    ______________________________ [name], M.D. PRE/MEDQ  D:  09/02/2011  T:  09/04/2011  Job:  [id number]

## 2011-09-04 NOTE — Progress Notes (Signed)
This office note has been dictated.

## 2011-09-04 NOTE — Patient Instructions (Signed)
Pamidronate injection What is this medicine? PAMIDRONATE (pa mi DROE nate) slows calcium loss from bones. It is used to treat high calcium blood levels from cancer or Paget's disease. It is also used to treat bone pain and prevent fractures from certain cancers that have spread to the bone. This medicine may be used for other purposes; ask your health care provider or pharmacist if you have questions. What should I tell my health care provider before I take this medicine? They need to know if you have any of these conditions: -aspirin-sensitive asthma -dental disease -kidney disease -an unusual or allergic reaction to pamidronate, other medicines, foods, dyes, or preservatives -pregnant or trying to get pregnant -breast-feeding How should I use this medicine? This medicine is for infusion into a vein. It is given by a health care professional in a hospital or clinic setting. Talk to your pediatrician regarding the use of this medicine in children. This medicine is not approved for use in children. Overdosage: If you think you have taken too much of this medicine contact a poison control center or emergency room at once. NOTE: This medicine is only for you. Do not share this medicine with others. What if I miss a dose? This does not apply. What may interact with this medicine? -certain antibiotics given by injection -medicines for inflammation or pain like ibuprofen, naproxen -some diuretics like bumetanide, furosemide -cyclosporine -parathyroid hormone -tacrolimus -teriparatide -thalidomide This list may not describe all possible interactions. Give your health care provider a list of all the medicines, herbs, non-prescription drugs, or dietary supplements you use. Also tell them if you smoke, drink alcohol, or use illegal drugs. Some items may interact with your medicine. What should I watch for while using this medicine? Visit your doctor or health care professional for regular  checkups. It may be some time before you see the benefit from this medicine. Do not stop taking your medicine unless your doctor tells you to. Your doctor may order blood tests or other tests to see how you are doing. Women should inform their doctor if they wish to become pregnant or think they might be pregnant. There is a potential for serious side effects to an unborn child. Talk to your health care professional or pharmacist for more information. You should make sure that you get enough calcium and vitamin D while you are taking this medicine. Discuss the foods you eat and the vitamins you take with your health care professional. Some people who take this medicine have severe bone, joint, and/or muscle pain. This medicine may also increase your risk for a broken thigh bone. Tell your doctor right away if you have pain in your upper leg or groin. Tell your doctor if you have any pain that does not go away or that gets worse. What side effects may I notice from receiving this medicine? Side effects that you should report to your doctor or health care professional as soon as possible: -allergic reactions like skin rash, itching or hives, swelling of the face, lips, or tongue -black or tarry stools -changes in vision -eye inflammation, pain -high blood pressure -jaw pain, especially burning or cramping -muscle weakness -numb, tingling pain -swelling of feet or hands -trouble passing urine or change in the amount of urine -unable to move easily Side effects that usually do not require medical attention (report to your doctor or health care professional if they continue or are bothersome): -bone, joint, or muscle pain -constipation -dizzy, drowsy -fever -headache -loss of  appetite -nausea, vomiting -pain at site where injected This list may not describe all possible side effects. Call your doctor for medical advice about side effects. You may report side effects to FDA at  1-800-FDA-1088. Where should I keep my medicine? This drug is given in a hospital or clinic and will not be stored at home. NOTE: This sheet is a summary. It may not cover all possible information. If you have questions about this medicine, talk to your doctor, pharmacist, or health care provider.  2012, [employer]vier/[employer] Standard. (07/15/2010 8:49:49 AM)

## 2011-09-05 ENCOUNTER — Ambulatory Visit (INDEPENDENT_AMBULATORY_CARE_PROVIDER_SITE_OTHER): Payer: BC Managed Care – PPO | Admitting: Obstetrics and Gynecology

## 2011-09-05 ENCOUNTER — Encounter: Payer: Self-pay | Admitting: Obstetrics and Gynecology

## 2011-09-05 ENCOUNTER — Other Ambulatory Visit: Payer: Self-pay | Admitting: *Deleted

## 2011-09-05 VITALS — BP 114/64 | Wt 185.0 lb

## 2011-09-05 DIAGNOSIS — D219 Benign neoplasm of connective and other soft tissue, unspecified: Secondary | ICD-10-CM

## 2011-09-05 DIAGNOSIS — N950 Postmenopausal bleeding: Secondary | ICD-10-CM | POA: Insufficient documentation

## 2011-09-05 DIAGNOSIS — C9000 Multiple myeloma not having achieved remission: Secondary | ICD-10-CM

## 2011-09-05 DIAGNOSIS — D259 Leiomyoma of uterus, unspecified: Secondary | ICD-10-CM

## 2011-09-05 MED ORDER — LENALIDOMIDE 15 MG PO CAPS: 15.0000 mg | ORAL_CAPSULE | Freq: Every day | ORAL | Status: DC

## 2011-09-05 NOTE — Patient Instructions (Signed)
ACOG Hysteroscopy pamphlet   Hysteroscopy Hysteroscopy is a procedure used for looking inside the womb (uterus). It may be done for many different reasons, including:  To evaluate abnormal bleeding, fibroid (benign, noncancerous) tumors, polyps, scar tissue (adhesions), and possibly cancer of the uterus.   To look for lumps (tumors) and other uterine growths.   To look for causes of why a woman cannot get pregnant (infertility), causes of recurrent loss of pregnancy (miscarriages), or a lost intrauterine device (IUD).   To perform a sterilization by blocking the fallopian tubes from inside the uterus.  A hysteroscopy should be done right after a menstrual period to be sure you are not pregnant. LET YOUR CAREGIVER KNOW ABOUT:   Allergies.   Medicines taken, including herbs, eyedrops, over-the-counter medicines, and creams.   Use of steroids (by mouth or creams).   Previous problems with anesthetics or numbing medicines.   History of bleeding or blood problems.   History of blood clots.   Possibility of pregnancy, if this applies.   Previous surgery.   Other health problems.  RISKS AND COMPLICATIONS   Putting a hole in the uterus.   Excessive bleeding.   Infection.   Damage to the cervix.   Injury to other organs.   Allergic reaction to medicines.   Too much fluid used in the uterus for the procedure.  BEFORE THE PROCEDURE   Do not take aspirin or blood thinners for a week before the procedure, or as directed. It can cause bleeding.   Arrive at least 60 minutes before the procedure or as directed to read and sign the necessary forms.   Arrange for someone to take you home after the procedure.   If you smoke, do not smoke for 2 weeks before the procedure.  PROCEDURE   Your caregiver may give you medicine to relax you. He or she may also give you a medicine that numbs the area around the cervix (local anesthetic) or a medicine that makes you sleep (general  anesthesia).   Sometimes, a medicine is placed in the cervix the day before the procedure. This medicine makes the cervix have a larger opening (dilate). This makes it easier for the instrument to be inserted into the uterus.   A small instrument (hysteroscope) is inserted through the vagina into the uterus. This instrument is similar to a pencil-sized telescope with a light.   During the procedure, air or a liquid is put into the uterus, which allows the surgeon to see better.   Sometimes, tissue is gently scraped from inside the uterus. These tissue samples are sent to a specialist who looks at tissue samples (pathologist). The pathologist will give a report to your caregiver. This will help your caregiver decide if further treatment is necessary. The report will also help your caregiver decide on the best treatment if the test comes back abnormal.  AFTER THE PROCEDURE   If you had a general anesthetic, you may be groggy for a couple hours after the procedure.   If you had a local anesthetic, you will be advised to rest at the surgical center or caregiver's office until you are stable and feel ready to go home.   You may have some cramping for a couple days.   You may have bleeding, which varies from light spotting for a few days to menstrual-like bleeding for up to 3 to 7 days. This is normal.   Have someone take you home.  FINDING OUT THE RESULTS OF YOUR  TEST Not all test results are available during your visit. If your test results are not back during the visit, make an appointment with your caregiver to find out the results. Do not assume everything is normal if you have not heard from your caregiver or the medical facility. It is important for you to follow up on all of your test results. HOME CARE INSTRUCTIONS   Do not drive for 24 hours or as instructed.   Only take over-the-counter or prescription medicines for pain, discomfort, or fever as directed by your caregiver.   Do not  take aspirin. It can cause or aggravate bleeding.   Do not drive or drink alcohol while taking pain medicine.   You may resume your usual diet.   Do not use tampons, douche, or have sexual intercourse for 2 weeks, or as advised by your caregiver.   Rest and sleep for the first 24 to 48 hours.   Take your temperature twice a day for 4 to 5 days. Write it down. Give these temperatures to your caregiver if they are abnormal (above 98.6 F or 37.0 C).   Take medicines your caregiver has ordered as directed.   Follow your caregiver's advice regarding diet, exercise, lifting, driving, and general activities.   Take showers instead of baths for 2 weeks, or as recommended by your caregiver.   If you develop constipation:   Take a mild laxative with the advice of your caregiver.   Eat bran foods.   Drink enough water and fluids to keep your urine clear or pale yellow.   Try to have someone with you or available to you for the first 24 to 48 hours, especially if you had a general anesthetic.   Make sure you and your family understand everything about your operation and recovery.   Follow your caregiver's advice regarding follow-up appointments and Pap smears.  SEEK MEDICAL CARE IF:   You feel dizzy or lightheaded.   You feel sick to your stomach (nauseous).   You develop abnormal vaginal discharge.   You develop a rash.   You have an abnormal reaction or allergy to your medicine.   You need stronger pain medicine.  SEEK IMMEDIATE MEDICAL CARE IF:   Bleeding is heavier than a normal menstrual period or you have blood clots.   You have an oral temperature above 102 F (38.9 C), not controlled by medicine.   You have increasing cramps or pains not relieved with medicine.   You develop belly (abdominal) pain that does not seem to be related to the same area of earlier cramping and pain.   You pass out.   You develop pain in the tops of your shoulders (shoulder strap  areas).   You develop shortness of breath.  MAKE SURE YOU:   Understand these instructions.   Will watch your condition.   Will get help right away if you are not doing well or get worse.  Document Released: 04/24/2000 Document Revised: 01/05/2011 Document Reviewed: 08/17/2008 [facility] Patient Information 2012 [place].

## 2011-09-05 NOTE — Telephone Encounter (Signed)
Received refill request from [facility] for Revlimid 15 mg. Confirmed with Dr Marin Olp that she is to continue taking this 21 days on 7 days off. Obtained auth # [id number]. Will have Dr Marin Olp sign the rx and fax it back to [phone].

## 2011-09-05 NOTE — Progress Notes (Signed)
FOLLOWUP VISIT  Subjective: The patient states that since her ultrasound she has had much more in the way of hot flashes and some additional abdominal pain. She has not had a fever at all and no nausea or vomiting. She continues to have almost daily bleeding or spotting from the vagina.  Objective: BP 114/64  Wt 185 lb (83.915 kg)  Estradiol 12.2 FSH 35.0 Both in menopausal range EXAM(S) PERFORMED:  EXAM DATE/TIME:  ACCESSION:   MR Pelvis W Wo Contrast  08/23/2011 6:53 PM  PX:[id number]     Read By: Marlaine Hind, MD       *RADIOLOGY REPORT*  Clinical Data: Post menopausal bleeding. Uterine fibroids.  Multiple myeloma.   MRI PELVIS WITHOUT AND WITH CONTRAST    Comparison: None.  Findings: The uterus measures 15.8 x 8.1 x 10.2 cm, for estimated  volume of 679 ml. Multiple fibroids are seen throughout the uterus  as follows:  1. Fundal, intramural, measuring 9.0 by 7.3 x 7.1 cm, partial  submucosal component indenting the fundal portion of endometrial  cavity, completely degenerated with no contrast enhancement  2. Right posterior corpus, intramural, measuring 6.3 x 4.2 by 4.2  cm, hypovascular with mild contrast enhancement  3 & 4. Left lateral corpus, intramural, measuring 4.9 and 3.6 cm  in maximum diameter, both mildly hypervascular with diffuse  contrast enhancement  5. Left anterior lower uterine corpus, submucosal and  intracavitary, 1.3 cm in maximum diameter, mildly hypervascular  with diffuse contrast enhancement  Others: At least five other tiny, less than 1 cm intramural and  submucosal fibroids  No pedunculated subserosal fibroids are seen. No abnormal  endometrial thickening is seen. Cervix is normal in appearance.  Small post menopausal ovaries are seen bilaterally. No evidence of  adnexal masses or free fluid. No evidence of inflammatory process  or suspicious bone lesions.  IMPRESSION:  1. Multiple uterine fibroids, as detailed above.  2. Normal postmenopausal  ovaries. No adnexal pathology  identified.  Original Report Authenticated By: Marlaine Hind, M.D.   Signed By: Marlaine Hind, MD on Thu Aug 24, 2011 08:41:00 AM EDT    Assessment: Postmenopausal bleeding with a normal appearing endometrial thickness and normal appearing ovaries. Long-standing uterine fibroids. Several small submucosal but single intracavitary fibroid which may be associated with current bleeding. Inability to assess the endometrial pathology as an outpatient Multiple myeloma, recurrent  Recommendation: Hysteroscopic evaluation of the uterus and possible resection of the intracavitary fibroid along with uterine curettage will give pathologic evaluation of the endometrial cavity and may be relieved postmenopausal bleeding.  Indications for this procedure were explained to the patient as well as risks and she is given reading information concerning this recommendation.

## 2011-09-16 ENCOUNTER — Encounter (HOSPITAL_COMMUNITY): Payer: Self-pay | Admitting: Pharmacist

## 2011-09-18 ENCOUNTER — Telehealth: Payer: Self-pay | Admitting: Obstetrics and Gynecology

## 2011-09-19 ENCOUNTER — Telehealth: Payer: Self-pay | Admitting: Obstetrics and Gynecology

## 2011-09-19 NOTE — Telephone Encounter (Signed)
Hysteroscopy with possible Fibroid Resection; D&C scheduled for 09/27/11 @ 10:00 with [facility].  BCBS effective 07/31/11. Plan pays 80/20 after a $300 deductible.  Pre-op due $78.66. Pre-cert is not required. -Adrianne Pridgen

## 2011-09-20 ENCOUNTER — Other Ambulatory Visit: Payer: Self-pay | Admitting: Obstetrics and Gynecology

## 2011-09-21 ENCOUNTER — Other Ambulatory Visit: Payer: Self-pay | Admitting: Obstetrics and Gynecology

## 2011-09-22 ENCOUNTER — Encounter (HOSPITAL_COMMUNITY)
Admission: RE | Admit: 2011-09-22 | Discharge: 2011-09-22 | Disposition: A | Discharge status: Home / Self Care | Payer: BC Managed Care – PPO | Source: Ambulatory Visit | Attending: Obstetrics and Gynecology | Admitting: Obstetrics and Gynecology

## 2011-09-22 ENCOUNTER — Encounter (HOSPITAL_COMMUNITY): Payer: Self-pay

## 2011-09-22 ENCOUNTER — Other Ambulatory Visit: Payer: Self-pay | Admitting: Obstetrics and Gynecology

## 2011-09-22 ENCOUNTER — Telehealth: Payer: Self-pay

## 2011-09-22 HISTORY — DX: Other specified postprocedural states: Z98.890

## 2011-09-22 HISTORY — DX: Nausea with vomiting, unspecified: R11.2

## 2011-09-22 LAB — BASIC METABOLIC PANEL
BUN: 14 mg/dL (ref 6–23)
CO2: 28 mEq/L (ref 19–32)
Calcium: 9.4 mg/dL (ref 8.4–10.5)
Chloride: 98 mEq/L (ref 96–112)
Creatinine, Ser: 0.9 mg/dL (ref 0.50–1.10)
GFR calc Af Amer: 80 mL/min — ABNORMAL LOW (ref >90)
GFR calc non Af Amer: 69 mL/min — ABNORMAL LOW (ref >90)
Glucose, Bld: 87 mg/dL (ref 70–99)
Potassium: 3.2 mEq/L — ABNORMAL LOW (ref 3.5–5.1)
Sodium: 136 mEq/L (ref 135–145)

## 2011-09-22 LAB — CBC
HCT: 32.9 % — ABNORMAL LOW (ref 36.0–46.0)
Hemoglobin: 10.8 g/dL — ABNORMAL LOW (ref 12.0–15.0)
MCH: 26.2 pg (ref 26.0–34.0)
MCHC: 32.8 g/dL (ref 30.0–36.0)
MCV: 79.9 fL (ref 78.0–100.0)
Platelets: 340 10*3/uL (ref 150–400)
RBC: 4.12 MIL/uL (ref 3.87–5.11)
RDW: 16.7 % — ABNORMAL HIGH (ref 11.5–15.5)
WBC: 4.3 10*3/uL (ref 4.0–10.5)

## 2011-09-22 MED ORDER — POTASSIUM CHLORIDE CRYS ER 20 MEQ PO TBCR: 20.0000 meq | EXTENDED_RELEASE_TABLET | Freq: Every day | ORAL | Status: DC

## 2011-09-22 NOTE — Patient Instructions (Addendum)
20 Emily Greer  09/22/2011   Your procedure is scheduled on:  09/27/11  Enter through the Main Entrance of [facility] [place] up the phone at the desk and dial [phone].   Call this number if you have problems the morning of surgery: [phone]   Remember:   Do not eat food:After Midnight.  Do not drink clear liquids: After Midnight.  Take these medicines the morning of surgery with A SIP OF WATER: Blood pressure medications   Do not wear jewelry, make-up or nail polish.  Do not wear lotions, powders, or perfumes. You may wear deodorant.  Do not shave 48 hours prior to surgery.  Do not bring valuables to the hospital.  Contacts, dentures or bridgework may not be worn into surgery.  Leave suitcase in the car. After surgery it may be brought to your room.  For patients admitted to the hospital, checkout time is 11:00 AM the day of discharge.   Patients discharged the day of surgery will not be allowed to drive home.  Name and phone number of your driver: husband  Emily Greer  Special Instructions: CHG Shower Use Special Wash: 1/2 bottle night before surgery and 1/2 bottle morning of surgery.   Please read over the following fact sheets that you were given: Surgical Site Infection Prevention

## 2011-09-22 NOTE — Telephone Encounter (Signed)
Tc to pt per test results. Lm on vm rgdg potassium results-3.2 due to no answer. Pt needs to start meds ASAP per vph. Lm on vm to cb with any questions/concerns. Rx K-Dur e-pres to pharm on file.

## 2011-09-25 NOTE — H&P (Signed)
Emily Greer is an 61 y.o. female who presents for evaluation of post menopausal bleeding.  The pt had her last menstrual period in 1999 when she was diagnosed with multiple myeloma.  She had menorrhagia and was treated with Depo-Lupron to allow her to undergo her chemotherapy and bone marrow transplant.  She never had menses again. In 2008, she had vaginal spotting with a negative endometrial biopsy, which was thought to be due to vaginal atrophy.In March on 2013, she began having some vaginal bleeding unassociated with intercourse.  This has persisted intermittently since that time through today  Pertinent Gynecological History: Menses: post-menopausal Bleeding: intermittent and perisitent Contraception: post menopausal status and tubal ligation DES exposure: denies Blood transfusions: bone marrow transplant Sexually transmitted diseases: no past history Previous GYN Procedures: tubal ligation  Last mammogram: normal Date: July 2012 Last pap: normal Date: May 2013 OB History: G3P2-0-1-2   Menstrual History: Menarche age: 66 No LMP recorded. Patient is postmenopausal.    Past Medical History  Diagnosis Date  . Lambda Light Chain Myeloma 11/11/2007  . Hypertension 11/28/97  . H/O multiple myeloma 11/28/1997  . Staphylococcus aureus bacteremia 11/28/1997  . Staphylococcus epidermidis bacteremia 11/28/97  . Pregnancy induced hypertension   . Osteoporosis 12/18/05    Increased  risk   . Increased BMI 12/18/05  . Post-menopausal bleeding 12/19/06  . Vaginal atrophy 12/19/06  . Elevated hemoglobin A1c     Borderline  . Fibroid 07/26/10    asymptomatic  . Vaginal atrophy   . PONV (postoperative nausea and vomiting)     Past Surgical History  Procedure Date  . Tubal ligation 1986  . Ectopic pregnancy surgery 1992    History reviewed. No pertinent family history.  Social History:  reports that she has never smoked. She has never used smokeless tobacco. She reports that  she does not drink alcohol or use illicit drugs.  Allergies:  Allergies  Allergen Reactions  . Codeine Nausea Only    Prescriptions prior to admission  Medication Sig Dispense Refill  . calcium carbonate (OS-CAL) 600 MG TABS Take 600 mg by mouth 2 (two) times daily with a meal.        . Ergocalciferol (VITAMIN D2) 2000 UNITS TABS Take by mouth daily.      [name] Kitchen lenalidomide (REVLIMID) 15 MG capsule Take 1 capsule (15 mg total) by mouth daily. 21 days on with 7 days break. Auth # [id number]  21 capsule  0  . loratadine (CLARITIN) 10 MG tablet Take 10 mg by mouth daily.        . metoprolol (TOPROL-XL) 50 MG 24 hr tablet Take 50 mg by mouth daily.        . misoprostol (CYTOTEC) 200 MCG tablet Pt to insert 1 tablet in vagina 12 hours prior to procedure;then insert 1 tablet in vagina 6 hours prior to procedure.  2 tablet  0  . potassium chloride SA (K-DUR,KLOR-CON) 20 MEQ tablet Take 1 tablet (20 mEq total) by mouth daily.  60 tablet  0  . TRIBENZOR 20-5-12.5 MG TABS         Review of Systems  Constitutional: Negative.   HENT: Negative.   Respiratory: Negative.   Cardiovascular: Negative.   Gastrointestinal: Positive for diarrhea. Negative for blood in stool and melena.       Secondary to Revlimid  Genitourinary:       Vaginal bleeding  Musculoskeletal: Negative.   Skin: Negative.   Neurological: Negative.   Endo/Heme/Allergies: Positive for environmental  allergies.       Takes Claritin  Psychiatric/Behavioral: Negative for depression, suicidal ideas, hallucinations and substance abuse. The patient is not nervous/anxious and does not have insomnia.     Blood pressure 115/76, pulse 80, temperature 99.1 F (37.3 C), temperature source Oral, resp. rate 16, height 5\' 4"  (1.626 m), weight 182 lb (82.555 kg), SpO2 98.00%. Physical Exam  Constitutional: She appears well-developed and well-nourished.  HENT:  Head: Normocephalic and atraumatic.  Eyes: Conjunctivae and EOM are normal.  Neck:  Normal range of motion. Neck supple.  Cardiovascular: Normal rate and regular rhythm.   Respiratory: Effort normal and breath sounds normal.  GI: Soft. Bowel sounds are normal. She exhibits mass.       Suprapubic mass, just above the pubis, nontender  Abdomen: The abdomen is soft with a suprapubic mass consistent with fibroids which is nontender. No other organomegaly is noted  Pelvic: External genitalia without lesions  The vagina is atrophic with contact bleeding noted  The cervix is likewise atrophic with contact bleeding noted particularly on the anterior cervix  The uterus is enlarged to approximately 14-16 weeks size which is stable, with decreased mobility.  Adnexa : no separable masses can be palpated  Rectovaginal no separable masses can be palpated   MRI PELVIS WITHOUT AND WITH CONTRAST (done 08/23/11) Findings: The uterus measures 15.8 x 8.1 x 10.2 cm, for estimated  volume of 679 ml. Multiple fibroids are seen throughout the uterus  as follows:  1. Fundal, intramural, measuring 9.0 by 7.3 x 7.1 cm, partial  submucosal component indenting the fundal portion of endometrial  cavity, completely degenerated with no contrast enhancement  2. Right posterior corpus, intramural, measuring 6.3 x 4.2 by 4.2  cm, hypovascular with mild contrast enhancement  3 & 4. Left lateral corpus, intramural, measuring 4.9 and 3.6 cm  in maximum diameter, both mildly hypervascular with diffuse  contrast enhancement  5. Left anterior lower uterine corpus, submucosal and  intracavitary, 1.3 cm in maximum diameter, mildly hypervascular  with diffuse contrast enhancement  Others: At least five other tiny, less than 1 cm intramural and  submucosal fibroids  No pedunculated subserosal fibroids are seen. No abnormal  endometrial thickening is seen. Cervix is normal in appearance.  Small post menopausal ovaries are seen bilaterally. No evidence of  adnexal masses or free fluid. No evidence of  inflammatory process  or suspicious bone lesions.  IMPRESSION:  1. Multiple uterine fibroids, as detailed above.  2. Normal postmenopausal ovaries. No adnexal pathology  identified.   Assessment Post menopausal bleeding Long standing uterine fibroids Suggestion of intracavitary lesion, possible fibroid Multiple Myeloma  Plan Hysteroscopic evaluation of the uterus and possible resection of the intracavitary fibroid along with uterine curettage will give pathologic evaluation of the endometrial cavity and may  relieve postmenopausal bleeding. Indications for this procedure were explained to the patient as well as risks and she acknowleges understanding.   Elnathan Fulford P 09/27/2011, 9:49 AM

## 2011-09-26 ENCOUNTER — Other Ambulatory Visit: Payer: Self-pay

## 2011-09-26 ENCOUNTER — Encounter (HOSPITAL_COMMUNITY): Payer: Self-pay | Admitting: Obstetrics and Gynecology

## 2011-09-26 MED ORDER — MISOPROSTOL 200 MCG PO TABS: ORAL_TABLET | ORAL | Status: DC

## 2011-09-27 ENCOUNTER — Encounter (HOSPITAL_COMMUNITY): Payer: Self-pay | Admitting: Anesthesiology

## 2011-09-27 ENCOUNTER — Ambulatory Visit (HOSPITAL_COMMUNITY)
Admission: RE | Admit: 2011-09-27 | Discharge: 2011-09-27 | Disposition: A | Discharge status: Home / Self Care | Payer: BC Managed Care – PPO | Source: Ambulatory Visit | Attending: Obstetrics and Gynecology | Admitting: Obstetrics and Gynecology

## 2011-09-27 ENCOUNTER — Encounter (HOSPITAL_COMMUNITY): Payer: Self-pay | Admitting: *Deleted

## 2011-09-27 ENCOUNTER — Encounter (HOSPITAL_COMMUNITY)
Admission: RE | Disposition: A | Discharge status: Home / Self Care | Payer: Self-pay | Source: Ambulatory Visit | Attending: Obstetrics and Gynecology

## 2011-09-27 ENCOUNTER — Ambulatory Visit (HOSPITAL_COMMUNITY): Payer: BC Managed Care – PPO | Admitting: Anesthesiology

## 2011-09-27 DIAGNOSIS — N950 Postmenopausal bleeding: Secondary | ICD-10-CM | POA: Insufficient documentation

## 2011-09-27 DIAGNOSIS — C9000 Multiple myeloma not having achieved remission: Secondary | ICD-10-CM

## 2011-09-27 DIAGNOSIS — N840 Polyp of corpus uteri: Secondary | ICD-10-CM | POA: Insufficient documentation

## 2011-09-27 DIAGNOSIS — N859 Noninflammatory disorder of uterus, unspecified: Secondary | ICD-10-CM

## 2011-09-27 HISTORY — PX: HYSTEROSCOPY WITH D & C: SHX1775

## 2011-09-27 SURGERY — DILATATION AND CURETTAGE /HYSTEROSCOPY
Anesthesia: General | Site: Uterus | Wound class: Clean Contaminated

## 2011-09-27 MED ORDER — ONDANSETRON HCL 4 MG/2ML IJ SOLN
INTRAMUSCULAR | Status: DC | PRN
  Administered 2011-09-27: 4 mg via INTRAVENOUS

## 2011-09-27 MED ORDER — DEXAMETHASONE SODIUM PHOSPHATE 10 MG/ML IJ SOLN
INTRAMUSCULAR | Status: AC
  Filled 2011-09-27: qty 1

## 2011-09-27 MED ORDER — GLYCINE 1.5 % IR SOLN
Status: DC | PRN
  Administered 2011-09-27: 3000 mL

## 2011-09-27 MED ORDER — PROMETHAZINE HCL 25 MG/ML IJ SOLN: 6.2500 mg | INTRAMUSCULAR | Status: DC | PRN

## 2011-09-27 MED ORDER — LACTATED RINGERS IV SOLN
INTRAVENOUS | Status: DC
  Administered 2011-09-27 (×2): via INTRAVENOUS

## 2011-09-27 MED ORDER — PROPOFOL 10 MG/ML IV EMUL
INTRAVENOUS | Status: DC | PRN
  Administered 2011-09-27: 200 mg via INTRAVENOUS

## 2011-09-27 MED ORDER — LIDOCAINE HCL 2 % IJ SOLN
INTRAMUSCULAR | Status: AC
  Filled 2011-09-27: qty 1

## 2011-09-27 MED ORDER — MIDAZOLAM HCL 2 MG/2ML IJ SOLN
INTRAMUSCULAR | Status: AC
  Filled 2011-09-27: qty 2

## 2011-09-27 MED ORDER — ONDANSETRON HCL 4 MG/2ML IJ SOLN
INTRAMUSCULAR | Status: AC
  Filled 2011-09-27: qty 2

## 2011-09-27 MED ORDER — DEXAMETHASONE SODIUM PHOSPHATE 10 MG/ML IJ SOLN
INTRAMUSCULAR | Status: DC | PRN
  Administered 2011-09-27: 10 mg via INTRAVENOUS

## 2011-09-27 MED ORDER — PROPOFOL 10 MG/ML IV EMUL
INTRAVENOUS | Status: AC
  Filled 2011-09-27: qty 20

## 2011-09-27 MED ORDER — MEPERIDINE HCL 25 MG/ML IJ SOLN: 6.2500 mg | INTRAMUSCULAR | Status: DC | PRN

## 2011-09-27 MED ORDER — KETOROLAC TROMETHAMINE 60 MG/2ML IM SOLN
INTRAMUSCULAR | Status: AC
  Filled 2011-09-27: qty 2

## 2011-09-27 MED ORDER — KETOROLAC TROMETHAMINE 30 MG/ML IJ SOLN
INTRAMUSCULAR | Status: AC
  Filled 2011-09-27: qty 2

## 2011-09-27 MED ORDER — FENTANYL CITRATE 0.05 MG/ML IJ SOLN: 25.0000 ug | INTRAMUSCULAR | Status: DC | PRN

## 2011-09-27 MED ORDER — FENTANYL CITRATE 0.05 MG/ML IJ SOLN
INTRAMUSCULAR | Status: AC
  Filled 2011-09-27: qty 2

## 2011-09-27 MED ORDER — KETOROLAC TROMETHAMINE 60 MG/2ML IM SOLN
INTRAMUSCULAR | Status: DC | PRN
  Administered 2011-09-27: 30 mg via INTRAMUSCULAR

## 2011-09-27 MED ORDER — KETOROLAC TROMETHAMINE 30 MG/ML IJ SOLN: 15.0000 mg | Freq: Once | INTRAMUSCULAR | Status: DC | PRN

## 2011-09-27 MED ORDER — LIDOCAINE HCL 2 % IJ SOLN
INTRAMUSCULAR | Status: DC | PRN
  Administered 2011-09-27: 10 mL

## 2011-09-27 MED ORDER — HYDROCODONE-ACETAMINOPHEN 5-500 MG PO TABS: ORAL_TABLET | ORAL | Status: DC

## 2011-09-27 MED ORDER — IBUPROFEN 600 MG PO TABS: ORAL_TABLET | ORAL | Status: DC

## 2011-09-27 MED ORDER — MIDAZOLAM HCL 5 MG/5ML IJ SOLN
INTRAMUSCULAR | Status: DC | PRN
  Administered 2011-09-27: 2 mg via INTRAVENOUS

## 2011-09-27 MED ORDER — KETOROLAC TROMETHAMINE 30 MG/ML IJ SOLN
INTRAMUSCULAR | Status: DC | PRN
  Administered 2011-09-27: 30 mg via INTRAVENOUS

## 2011-09-27 MED ORDER — PROMETHAZINE HCL 12.5 MG PO TABS: 12.5000 mg | ORAL_TABLET | ORAL | Status: DC | PRN

## 2011-09-27 MED ORDER — MIDAZOLAM HCL 2 MG/2ML IJ SOLN: 0.5000 mg | Freq: Once | INTRAMUSCULAR | Status: DC | PRN

## 2011-09-27 MED ORDER — LIDOCAINE HCL (CARDIAC) 20 MG/ML IV SOLN
INTRAVENOUS | Status: AC
  Filled 2011-09-27: qty 5

## 2011-09-27 MED ORDER — FENTANYL CITRATE 0.05 MG/ML IJ SOLN
INTRAMUSCULAR | Status: DC | PRN
  Administered 2011-09-27 (×2): 50 ug via INTRAVENOUS

## 2011-09-27 MED ORDER — DEXTROSE 5 % IV SOLN
2.0000 g | Freq: Once | INTRAVENOUS | Status: AC
  Administered 2011-09-27: 2 g via INTRAVENOUS
  Filled 2011-09-27: qty 2

## 2011-09-27 MED ORDER — LIDOCAINE HCL (CARDIAC) 20 MG/ML IV SOLN
INTRAVENOUS | Status: DC | PRN
  Administered 2011-09-27: 50 mg via INTRAVENOUS

## 2011-09-27 SURGICAL SUPPLY — 23 items
BOOTIES KNEE HIGH SLOAN (MISCELLANEOUS) ×6 IMPLANT
CANISTER SUCTION 2500CC (MISCELLANEOUS) ×3 IMPLANT
CATH ROBINSON RED A/P 16FR (CATHETERS) ×3 IMPLANT
CLOTH BEACON ORANGE TIMEOUT ST (SAFETY) ×3 IMPLANT
CONTAINER PREFILL 10% NBF 60ML (FORM) ×6 IMPLANT
CORD ACTIVE DISPOSABLE (ELECTRODE) ×1
CORD ELECTRO ACTIVE DISP (ELECTRODE) ×2 IMPLANT
DILATOR CANAL MILEX (MISCELLANEOUS) IMPLANT
ELECT LOOP GYNE PRO 24FR (CUTTING LOOP)
ELECT REM PT RETURN 9FT ADLT (ELECTROSURGICAL) ×3
ELECT VAPORTRODE GRVD BAR (ELECTRODE) IMPLANT
ELECTRODE LOOP GYNE PRO 24FR (CUTTING LOOP) IMPLANT
ELECTRODE REM PT RTRN 9FT ADLT (ELECTROSURGICAL) ×2 IMPLANT
ELECTRODE ROLLER VERSAPOINT (ELECTRODE) IMPLANT
ELECTRODE RT ANGLE VERSAPOINT (CUTTING LOOP) IMPLANT
ELECTRODE TWIZZLE TIP (MISCELLANEOUS) IMPLANT
GLOVE SURG SS PI 6.5 STRL IVOR (GLOVE) ×6 IMPLANT
GOWN PREVENTION PLUS LG XLONG (DISPOSABLE) ×6 IMPLANT
GOWN STRL REIN XL XLG (GOWN DISPOSABLE) ×3 IMPLANT
LOOP ANGLED CUTTING 22FR (CUTTING LOOP) ×2 IMPLANT
PACK HYSTEROSCOPY LF (CUSTOM PROCEDURE TRAY) ×3 IMPLANT
TOWEL OR 17X24 6PK STRL BLUE (TOWEL DISPOSABLE) ×6 IMPLANT
WATER STERILE IRR 1000ML POUR (IV SOLUTION) ×3 IMPLANT

## 2011-09-27 NOTE — Preoperative (Signed)
Beta Blockers   Reason not to administer Beta Blockers:Not Applicable 

## 2011-09-27 NOTE — Anesthesia Postprocedure Evaluation (Signed)
Anesthesia Post Note  Patient: Emily Greer  Procedure(s) Performed: Procedure(s) (LRB): DILATATION & CURETTAGE/HYSTEROSCOPY WITH RESECTOCOPE (N/A)  Anesthesia type: GA  Patient location: PACU  Post pain: Pain level controlled  Post assessment: Post-op Vital signs reviewed  Last Vitals:  Filed Vitals:   09/27/11 1100  BP: 109/65  Pulse: 67  Temp:   Resp: 14    Post vital signs: Reviewed  Level of consciousness: sedated  Complications: No apparent anesthesia complications

## 2011-09-27 NOTE — Op Note (Signed)
09/27/2011  11:32 AM  PATIENT:  Emily Greer  61 y.o. female  PRE-OPERATIVE DIAGNOSIS:  Postmenopausal Bleeding  POST-OPERATIVE DIAGNOSIS:  postmenopausal bleeding  PROCEDURE:  Procedure(s): DILATATION & CURETTAGE/HYSTEROSCOPY   SURGEON:  Chauntay Paszkiewicz P, MD  ASSISTANTS: None  ANESTHESIA:   general  ESTIMATED BLOOD LOSS: minimal  BLOOD ADMINISTERED:none  COMPLICATIONS:none  FINDINGS: Uterus sounded to 9 cm.  There were several polypoid lesions filling the endometrial cavity.  Some of the lesions contained blood on the surface and irregular vascularity.  A moderate amount of tissue was obtained at [facility].  After curettage, the endometrial cavity was smooth.  FLUID DEFICIT:  20cc  LOCAL MEDICATIONS USED:  LIDOCAINE  and Amount: 10 ml  SPECIMEN:  Source of Specimen:  endometrial currettings  DISPOSITION OF SPECIMEN:  PATHOLOGY  COUNTS:  YES  DESCRIPTION OF PROCEDURE:the patient was taken to the operating room after appropriate identification and placed on the operating table. After the attainment of adequate general anesthesia she was placed in the lithotomy position. A time out was performed.The perineum and vagina were prepped with multiple layers of Betadine. The bladder was emptied with a an in and out catheter. The perineum was draped in sterile field. A Grave's  speculum was placed in the vagina. The cervix was grasped with a single-tooth tenaculum. A paracervical block was achieved with a total of 10 cc of 2% Xylocaine and the 5 and 7:00 positions. The uterus was sounded after dilation of the cervix to accommodate the diagnostic hysteroscope. The hysteroscope was used to evaluate all quadrants of the uterus. The hysteroscope was removed and all quadrants of the endometrial were curretted with production of a moderate amount of polypoid tissue.  The hysteroscope was reintroduced and the endometrial cavity noted to be without further lesions. All instruments were then  removed from the vagina and the patient was awakened from general anesthesia and taken to the recovery room in satisfactory condition having tolerated the procedure well sponge and instrument counts correct. Prior to the procedure the patient was treated with Cefotetan 2 gm IV.  Prior to completion of the procedure, she was treated with Toradol 30 mg IM and 30 mg IV/  PLAN OF CARE: discharge home  PATIENT DISPOSITION:  PACU - hemodynamically stable.   Delay start of Pharmacological VTE agent:  SCD hose used   Ky Moskowitz P, MD 11:32 AM

## 2011-09-27 NOTE — Transfer of Care (Signed)
Immediate Anesthesia Transfer of Care Note  Patient: Emily Greer  Procedure(s) Performed: Procedure(s) (LRB): DILATATION & CURETTAGE/HYSTEROSCOPY WITH RESECTOCOPE (N/A)  Patient Location: PACU  Anesthesia Type: General  Level of Consciousness: awake, alert  and patient cooperative  Airway & Oxygen Therapy: Patient Spontanous Breathing and Patient connected to nasal cannula oxygen  Post-op Assessment: Report given to PACU RN and Post -op Vital signs reviewed and stable  Post vital signs: Reviewed  Complications: No apparent anesthesia complications

## 2011-09-27 NOTE — Anesthesia Preprocedure Evaluation (Signed)
Anesthesia Evaluation  Patient identified by MRN, date of birth, ID band Patient awake    Reviewed: Allergy & Precautions, H&P , Patient's Chart, lab work & pertinent test results, reviewed documented beta blocker date and time   History of Anesthesia Complications (+) PONV  Airway Mallampati: III TM Distance: >3 FB Neck ROM: full    Dental No notable dental hx.    Pulmonary neg pulmonary ROS,  breath sounds clear to auscultation  Pulmonary exam normal       Cardiovascular Exercise Tolerance: Good hypertension, negative cardio ROS  Rhythm:regular Rate:Normal     Neuro/Psych negative neurological ROS  negative psych ROS   GI/Hepatic negative GI ROS, Neg liver ROS,   Endo/Other  negative endocrine ROS  Renal/GU negative Renal ROS     Musculoskeletal   Abdominal   Peds  Hematology negative hematology ROS (+)   Anesthesia Other Findings PONV (postoperative nausea and vomiting)     Lambda Light Chain Myeloma 11/11/2007      Hypertension 11/28/97   H/O multiple myeloma 11/28/1997      Staphylococcus aureus bacteremia 11/28/1997   Staphylococcus epidermidis bacteremia 11/28/97      Pregnancy induced hypertension     Osteoporosis 12/18/05 Increased  risk     Increased BMI 12/18/05   Post-menopausal bleeding 12/19/06      Vaginal atrophy 12/19/06   Elevated hemoglobin A1c   Borderline    Fibroid 07/26/10 asymptomatic Vaginal atrophy    Reproductive/Obstetrics negative OB ROS                           Anesthesia Physical Anesthesia Plan  ASA: III  Anesthesia Plan: General LMA   Post-op Pain Management:    Induction:   Airway Management Planned:   Additional Equipment:   Intra-op Plan:   Post-operative Plan:   Informed Consent: I have reviewed the patients History and Physical, chart, labs and discussed the procedure including the risks, benefits and alternatives for the proposed  anesthesia with the patient or authorized representative who has indicated his/her understanding and acceptance.   Dental Advisory Given  Plan Discussed with: CRNA, Surgeon and Anesthesiologist  Anesthesia Plan Comments:         Anesthesia Quick Evaluation

## 2011-09-28 ENCOUNTER — Encounter (HOSPITAL_COMMUNITY): Payer: Self-pay | Admitting: Obstetrics and Gynecology

## 2011-09-28 ENCOUNTER — Telehealth: Payer: Self-pay | Admitting: Obstetrics and Gynecology

## 2011-09-28 ENCOUNTER — Encounter: Payer: Self-pay | Admitting: Obstetrics and Gynecology

## 2011-09-28 DIAGNOSIS — C541 Malignant neoplasm of endometrium: Secondary | ICD-10-CM | POA: Insufficient documentation

## 2011-09-28 NOTE — Telephone Encounter (Signed)
Pt was notified of pathology report from her hysteroscopy, D&C of endometrial cancer.  She will be referred to the [facility] Oncology clinic for evaluation.  She expresses understanding.

## 2011-09-29 NOTE — Telephone Encounter (Signed)
Lm on vm of Nancy Wilkinson,RN(Gyn Onc) to return call rgdg referral.

## 2011-10-03 NOTE — Telephone Encounter (Signed)
Tc from pt. Pt aware of referral with Dr. Skeet Latch on 10/05/11. Pt voices understanding.

## 2011-10-03 NOTE — Telephone Encounter (Signed)
Pc to pt per referral to onc. Appt sched 10/05/11@3 :30p(arrive @ 3:00p) @ [facility]. Lm on vm for pt to cb to make aware.

## 2011-10-04 ENCOUNTER — Other Ambulatory Visit: Payer: Self-pay | Admitting: *Deleted

## 2011-10-04 DIAGNOSIS — C9000 Multiple myeloma not having achieved remission: Secondary | ICD-10-CM

## 2011-10-04 MED ORDER — LENALIDOMIDE 15 MG PO CAPS: 15.0000 mg | ORAL_CAPSULE | Freq: Every day | ORAL | Status: DC

## 2011-10-05 ENCOUNTER — Ambulatory Visit: Payer: BC Managed Care – PPO | Attending: Gynecologic Oncology | Admitting: Gynecologic Oncology

## 2011-10-05 ENCOUNTER — Encounter: Payer: Self-pay | Admitting: Gynecologic Oncology

## 2011-10-05 VITALS — BP 142/80 | HR 64 | Temp 98.3°F | Resp 16 | Ht 63.23 in | Wt 183.5 lb

## 2011-10-05 DIAGNOSIS — D259 Leiomyoma of uterus, unspecified: Secondary | ICD-10-CM | POA: Insufficient documentation

## 2011-10-05 DIAGNOSIS — C541 Malignant neoplasm of endometrium: Secondary | ICD-10-CM

## 2011-10-05 DIAGNOSIS — I10 Essential (primary) hypertension: Secondary | ICD-10-CM | POA: Insufficient documentation

## 2011-10-05 DIAGNOSIS — C549 Malignant neoplasm of corpus uteri, unspecified: Secondary | ICD-10-CM | POA: Insufficient documentation

## 2011-10-05 NOTE — Progress Notes (Signed)
Consult Note: Gyn-Onc  Consult was requested by Dr. Leo Grosser for the evaluation of Emily Greer 61 y.o. female  CC:  Chief Complaint  Patient presents with  . Endometrial cancer    New consult    HPI: G3 P2  LNMP in 1999 with post menopausal bleeding since 05/2011.  Spotting was intermittent but becoming heavier over the past month.  Seen by Dr. Leo Grosser in 05/2011.  Pelvic UTZ was suboptimal.  MRI of the pelvis without contrast on 08/23/2011 showed a uterus measuring 15.8 x 8.1 x 10.2 cm. Multiple fibroids are seen throughout the uterus specifically in the fundus measuring 9 x 7.3 x 7.1 cm there is a right posterior corpus leiomyoma measuring 6.3 cm in greatest diameter and of her leiomyoma measuring 4.9 and 3.6 cm. In the left anterior lower uterine corpus the submucosal intracavitary lesion measuring approximately 1.3 cm in maximum diameter is mildly endovascular was identified. The ovaries were noted to be small and menopausal is no evidence of free fluid.  On 09/19/2011 she underwent dilation and curettage and hysteroscopy. Findings were notable for the fact uterus sounded to 9 cm. There were several polypoid lesions filling the endometrial cavity. The pathology returned as an endometrioid carcinoma with focal squamous differentiation FIGO grade 2.  Patient's history is notable for the diagnosis of recurrent lambda light chain myeloma made in 1999. She's currently receives  (Revelmed 21 days on off 7 days, Aridia IV every 3-4 months .  His condition is considered to be stable.  She reports vaginal spotting and an Intentional 20 pound weight loss.    Current Meds:  Outpatient Encounter Prescriptions as of 10/05/2011  Medication Sig Dispense Refill  . calcium carbonate (OS-CAL) 600 MG TABS Take 600 mg by mouth 2 (two) times daily with a meal.        . Ergocalciferol (VITAMIN D2) 2000 UNITS TABS Take by mouth daily.      [name] Kitchen HYDROcodone-acetaminophen (VICODIN) 5-500 MG per tablet One tablet  every 4 hrs as needed for pain.  Recommended but declined by pt.  30 tablet  0  . ibuprofen (ADVIL,MOTRIN) 600 MG tablet Take Ibuprofen 600 mg orally every 6 hours for 3 days then every 6 hours as needed for pain  60 tablet  2  . lenalidomide (REVLIMID) 15 MG capsule Take 1 capsule (15 mg total) by mouth daily. 21 days on with 7 days break. Auth # [id number]  21 capsule  0  . loratadine (CLARITIN) 10 MG tablet Take 10 mg by mouth daily.        . metoprolol (TOPROL-XL) 50 MG 24 hr tablet Take 50 mg by mouth daily.        . potassium chloride SA (K-DUR,KLOR-CON) 20 MEQ tablet Take 1 tablet (20 mEq total) by mouth daily.  60 tablet  0  . promethazine (PHENERGAN) 12.5 MG tablet Take 1 tablet (12.5 mg total) by mouth every 4 (four) hours as needed for nausea.  30 tablet  0  . TRIBENZOR 20-5-12.5 MG TABS         Allergy:  Allergies  Allergen Reactions  . Codeine Nausea Only    Social Hx:   History   Social History  . Marital Status: Married    Spouse Name: N/A    Number of Children: N/A  . Years of Education: N/A   Occupational History  . Not on file.   Social History Main Topics  . Smoking status: Never Smoker   . Smokeless tobacco:  Never Used  . Alcohol Use: No  . Drug Use: No  . Sexually Active: Yes    Birth Control/ Protection: Condom   Other Topics Concern  . Not on file   Social History Narrative  . No narrative on file    Past Surgical Hx:  Past Surgical History  Procedure Date  . Tubal ligation 1986  . Ectopic pregnancy surgery 1992  . Hysteroscopy w/d&c 09/27/2011    Procedure: DILATATION AND CURETTAGE /HYSTEROSCOPY;  Surgeon: Eldred Manges, MD;  Location: [facility];  Service: Gynecology;;    Past Medical Hx:  Past Medical History  Diagnosis Date  . Lambda Light Chain Myeloma 11/11/2007  . Hypertension 11/28/97  . H/O multiple myeloma 11/28/1997  . Staphylococcus aureus bacteremia 11/28/1997  . Staphylococcus epidermidis bacteremia 11/28/97  . Pregnancy  induced hypertension   . Osteoporosis 12/18/05    Increased  risk   . Increased BMI 12/18/05  . Post-menopausal bleeding 12/19/06  . Vaginal atrophy 12/19/06  . Elevated hemoglobin A1c     Borderline  . Fibroid 07/26/10    asymptomatic  . Vaginal atrophy   . PONV (postoperative nausea and vomiting)     Past Gynecological History:  G3 P2 LNMP 199.  Menarche 13 regular menses until menopause.  Ectopic pergnancy, NSVD x 2. No h/o abnormal pap tests. No LMP recorded. Patient is postmenopausal. Tubal ligation. Mammogram 2010 Colonoscopy 5 years ago.  Family Hx: No family history on file.  Review of Systems:  Constitutional  Feels well Cardiovascular  No chest pain, shortness of breath, or edema  Pulmonary  No cough or wheeze.  Gastro Intestinal  No nausea, vomitting, reports diarrhea with chemo. No bright red blood per rectum, no abdominal pain, change in bowel movement, or constipation.  Genito Urinary  No frequency, urgency, dysuria, reports vaginal bleeding Musculo Skeletal  No myalgia, arthralgia, joint swelling or pain  Neurologic  No weakness, numbness, change in gait,  Psychology  No depression, anxiety, insomnia.   Vitals:  Blood pressure 142/80, pulse 64, temperature 98.3 F (36.8 C), temperature source Oral, resp. rate 16, height 5' 3.23" (1.606 m), weight 183 lb 8 oz (83.235 kg).Body mass index is 32.27 kg/(m^2).  Physical Exam: WD in NAD Neck  Supple NROM, without any enlargements.  Lymph Node Survey No cervical supraclavicular or inguinal adenopathy Cardiovascular  Pulse normal rate, regularity and rhythm. S1 and S2 normal.  Lungs  Clear to auscultation bilateraly, without wheezes/crackles/rhonchi. Good air movement.  Psychiatry  Alert and oriented to person, place, and time  Abdomen  Normoactive bowel sounds, abdomen soft, non-tender and obese. There is a 4 cm transverse incision in the right lower quadrant.  Back No CVA tenderness Genito Urinary    Vulva/vagina: Normal external female genitalia.  No lesions. No discharge or bleeding.  Bladder/urethra:  No lesions or masses  Vagina: Without any lesions a very small amount of blood is noted in the vault.  Cervix: Normal appearing, no lesions, blood noted in the cervical os  Uterus: Large bulky consistent with leiomyoma  Adnexa: Unable to assess given the presence of the uterine leiomyoma and the patient's girth  Rectal  Good tone, no masses no cul de sac nodularity.  Extremities  No bilateral cyanosis, clubbing or edema.   Assessment/Plan:  Emily Greer  is a 61 y.o.  year old with a FIGO grade 2 endometrial adenocarcinoma with in a fibroid uterus. The uterine size precludes a minimally invasive approach since morcellation would not  be an option in the presence of malignancy.  The plan is for total abdominal hysterectomy bilateral salpingo-oophorectomy pelvic and possible periaortic lymph node dissection on 10/31/2011. The risks of the procedure discussed with the patient were inclusive of infection bleeding damage to surrounding structures prolonged hospitalization and reoperation.  All of her questions and those of her husband were answered to their satisfaction.  The patient was advised to discontinue her multiple myeloma medications pending further recommendations from her treating medical oncologist Dr. Burney Gauze.   [name], MD, PhD 10/05/2011, 3:45 PM

## 2011-10-05 NOTE — Patient Instructions (Addendum)
The uterine size precludes a minimally invasive approach since morcellation would not be an option in the presence of malignancy.  The plan is for total abdominal hysterectomy bilateral salpingo-oophorectomy pelvic and possible periaortic lymph node dissection on 10/31/2011. The risks of the procedure are inclusive of infection bleeding damage to surrounding structures prolonged hospitalization and reoperation.   Advised to discontinue her multiple myeloma medications pending further recommendations from the  treating medical oncologist Dr. Burney Gauze.   Thank you very much [name] for allowing me to provide care for you today.  I appreciate your confidence in choosing our Gynecologic Oncology team.  If you have any questions about your visit today please call our office and we will get back to you as soon as possible.  Emily Greer. Emily Fedie MD., PhD Gynecologic Oncology

## 2011-10-06 ENCOUNTER — Telehealth: Payer: Self-pay | Admitting: Hematology & Oncology

## 2011-10-06 ENCOUNTER — Encounter: Payer: Self-pay | Admitting: *Deleted

## 2011-10-06 ENCOUNTER — Telehealth: Payer: Self-pay | Admitting: Gynecologic Oncology

## 2011-10-06 NOTE — Telephone Encounter (Signed)
Called to check with patient to confirm that she had received the message from Dr. Antonieta Pert office about medication adjustments around her surgical date.  Pt verbalizing listening to the message and understanding the instructions.  Instructed that pre-op testing at [facility] would be contacting her with a pre-op appointment.  No concerns voiced.  Instructed to call the office for any needs.

## 2011-10-06 NOTE — Telephone Encounter (Signed)
Left pt message with 10-16 appointment. Moved per [employer]

## 2011-10-06 NOTE — Progress Notes (Signed)
Patient to have hysterectomy on Oct 1.  Patient called asking whether to finish last 3 pills of Revlimid.  Dr. Marin Olp states yes to finish them.  Patient next appt with Dr. Marin Olp rescheduled for week of Oct 14 according to Dr. Marin Olp.  Called [facility] to let them know to hold patients Revlimid until Dr. Niel Hummer sees patient the week of OCt 14.  We will contact them to resume Revlimid.

## 2011-10-18 ENCOUNTER — Encounter: Payer: BC Managed Care – PPO | Admitting: Obstetrics and Gynecology

## 2011-10-18 ENCOUNTER — Telehealth: Payer: Self-pay

## 2011-10-18 NOTE — Telephone Encounter (Signed)
Tc to pt per vph recs. Pt opt not to have post op appt today due to recent evaluation by Dr. Skeet Latch on 10/05/11. Pt states,"an internal exam was performed". Pt denies any questions/concerns at this time. Pt states,"she informed Dr.Brewster's facility that she would like Dr. Leo Grosser to assist with surgery sched 10/31/11". Will make vph aware.

## 2011-10-23 ENCOUNTER — Encounter (HOSPITAL_COMMUNITY): Payer: Self-pay | Admitting: Pharmacy Technician

## 2011-10-23 ENCOUNTER — Other Ambulatory Visit: Payer: BC Managed Care – PPO | Admitting: Lab

## 2011-10-26 NOTE — Patient Instructions (Addendum)
[address]  10/26/2011   Your procedure is scheduled on:  10-31-2011  Report to [facility] at  1100 AM.  Call this number if you have problems the morning of surgery: [phone]   Remember:   Do not eat food :After Midnight night before surgery.clear liquids day before surgery Monday 10-30-2011.   clear liquids midnight until 730am, then nothing by mouth.  Take these medicines the morning of surgery with A SIP OF WATER: claritin, toprol xl   Do not wear jewelry or make up.  Do not wear lotions, powders, or perfumes. You may wear deodorant.    Do not bring valuables to the hospital.  Contacts, dentures or bridgework may not be worn into surgery.  Leave suitcase in the car. After surgery it may be brought to your room.  For patients admitted to the hospital, checkout time is 11:00 AM the day of discharge                             Patients discharged the day of surgery will not be allowed to drive home. If going home same day of surgery, you must have someone stay with you the first 24 hours at home and arrange for some one to drive you home from hospital.    Special Instructions: See [facility] Preparing for Surgery instruction sheet. Women do not shave legs or underarms for 12 hours before showers. Men may shave face morning of surgery.    Please read over the following fact sheets that you were given: MRSA Information, blood fact sheet, incentive spirometer fact sheet  [facility] pre op nurse phone number [phone], call if needed

## 2011-10-27 ENCOUNTER — Ambulatory Visit (HOSPITAL_COMMUNITY)
Admission: RE | Admit: 2011-10-27 | Discharge: 2011-10-27 | Disposition: A | Discharge status: Home / Self Care | Payer: BC Managed Care – PPO | Source: Ambulatory Visit | Attending: Gynecologic Oncology | Admitting: Gynecologic Oncology

## 2011-10-27 ENCOUNTER — Encounter (HOSPITAL_COMMUNITY)
Admission: RE | Admit: 2011-10-27 | Discharge: 2011-10-27 | Disposition: A | Discharge status: Home / Self Care | Payer: BC Managed Care – PPO | Source: Ambulatory Visit | Attending: Gynecologic Oncology | Admitting: Gynecologic Oncology

## 2011-10-27 ENCOUNTER — Encounter (HOSPITAL_COMMUNITY): Payer: Self-pay

## 2011-10-27 DIAGNOSIS — Z01818 Encounter for other preprocedural examination: Secondary | ICD-10-CM | POA: Insufficient documentation

## 2011-10-27 DIAGNOSIS — Z01812 Encounter for preprocedural laboratory examination: Secondary | ICD-10-CM | POA: Insufficient documentation

## 2011-10-27 DIAGNOSIS — C549 Malignant neoplasm of corpus uteri, unspecified: Secondary | ICD-10-CM | POA: Insufficient documentation

## 2011-10-27 LAB — COMPREHENSIVE METABOLIC PANEL
ALT: 25 U/L (ref 0–35)
AST: 18 U/L (ref 0–37)
Albumin: 3.7 g/dL (ref 3.5–5.2)
Alkaline Phosphatase: 54 U/L (ref 39–117)
BUN: 25 mg/dL — ABNORMAL HIGH (ref 6–23)
CO2: 27 mEq/L (ref 19–32)
Calcium: 10.1 mg/dL (ref 8.4–10.5)
Chloride: 103 mEq/L (ref 96–112)
Creatinine, Ser: 1.02 mg/dL (ref 0.50–1.10)
GFR calc Af Amer: 68 mL/min — ABNORMAL LOW (ref >90)
GFR calc non Af Amer: 59 mL/min — ABNORMAL LOW (ref >90)
Glucose, Bld: 92 mg/dL (ref 70–99)
Potassium: 4.1 mEq/L (ref 3.5–5.1)
Sodium: 141 mEq/L (ref 135–145)
Total Bilirubin: 0.2 mg/dL — ABNORMAL LOW (ref 0.3–1.2)
Total Protein: 7 g/dL (ref 6.0–8.3)

## 2011-10-27 LAB — SURGICAL PCR SCREEN
MRSA, PCR: NEGATIVE
Staphylococcus aureus: NEGATIVE

## 2011-10-27 LAB — CBC WITH DIFFERENTIAL/PLATELET
Basophils Absolute: 0.1 10*3/uL (ref 0.0–0.1)
Basophils Relative: 3 % — ABNORMAL HIGH (ref 0–1)
Eosinophils Absolute: 0.1 10*3/uL (ref 0.0–0.7)
Eosinophils Relative: 3 % (ref 0–5)
HCT: 34.4 % — ABNORMAL LOW (ref 36.0–46.0)
Hemoglobin: 11.4 g/dL — ABNORMAL LOW (ref 12.0–15.0)
Lymphocytes Relative: 32 % (ref 12–46)
Lymphs Abs: 1.4 10*3/uL (ref 0.7–4.0)
MCH: 26.5 pg (ref 26.0–34.0)
MCHC: 33.1 g/dL (ref 30.0–36.0)
MCV: 80 fL (ref 78.0–100.0)
Monocytes Absolute: 0.3 10*3/uL (ref 0.1–1.0)
Monocytes Relative: 7 % (ref 3–12)
Neutro Abs: 2.4 10*3/uL (ref 1.7–7.7)
Neutrophils Relative %: 55 % (ref 43–77)
Platelets: 284 10*3/uL (ref 150–400)
RBC: 4.3 MIL/uL (ref 3.87–5.11)
RDW: 17 % — ABNORMAL HIGH (ref 11.5–15.5)
WBC: 4.3 10*3/uL (ref 4.0–10.5)

## 2011-10-27 LAB — ABO/RH: ABO/RH(D): A POS

## 2011-10-27 NOTE — Progress Notes (Signed)
cmet results routed to nancy wilkinson rn inbasket via epic

## 2011-10-31 ENCOUNTER — Encounter (HOSPITAL_COMMUNITY): Payer: Self-pay | Admitting: *Deleted

## 2011-10-31 ENCOUNTER — Encounter (HOSPITAL_COMMUNITY): Payer: Self-pay | Admitting: Anesthesiology

## 2011-10-31 ENCOUNTER — Ambulatory Visit (HOSPITAL_COMMUNITY): Payer: BC Managed Care – PPO | Admitting: Anesthesiology

## 2011-10-31 ENCOUNTER — Encounter (HOSPITAL_COMMUNITY)
Admission: RE | Disposition: A | Discharge status: Home / Self Care | Payer: Self-pay | Source: Ambulatory Visit | Attending: Obstetrics & Gynecology

## 2011-10-31 ENCOUNTER — Inpatient Hospital Stay (HOSPITAL_COMMUNITY)
Admission: RE | Admit: 2011-10-31 | Discharge: 2011-11-03 | DRG: 354 | Disposition: A | Discharge status: Home / Self Care | Payer: BC Managed Care – PPO | Source: Ambulatory Visit | Attending: Obstetrics & Gynecology | Admitting: Obstetrics & Gynecology

## 2011-10-31 DIAGNOSIS — R110 Nausea: Secondary | ICD-10-CM | POA: Diagnosis not present

## 2011-10-31 DIAGNOSIS — Z885 Allergy status to narcotic agent status: Secondary | ICD-10-CM

## 2011-10-31 DIAGNOSIS — I10 Essential (primary) hypertension: Secondary | ICD-10-CM | POA: Diagnosis present

## 2011-10-31 DIAGNOSIS — C9000 Multiple myeloma not having achieved remission: Secondary | ICD-10-CM | POA: Diagnosis present

## 2011-10-31 DIAGNOSIS — Z791 Long term (current) use of non-steroidal anti-inflammatories (NSAID): Secondary | ICD-10-CM

## 2011-10-31 DIAGNOSIS — C541 Malignant neoplasm of endometrium: Secondary | ICD-10-CM

## 2011-10-31 DIAGNOSIS — D259 Leiomyoma of uterus, unspecified: Secondary | ICD-10-CM | POA: Diagnosis present

## 2011-10-31 DIAGNOSIS — C549 Malignant neoplasm of corpus uteri, unspecified: Principal | ICD-10-CM | POA: Diagnosis present

## 2011-10-31 DIAGNOSIS — Z780 Asymptomatic menopausal state: Secondary | ICD-10-CM

## 2011-10-31 HISTORY — PX: ABDOMINAL HYSTERECTOMY: SHX81

## 2011-10-31 HISTORY — PX: SALPINGOOPHORECTOMY: SHX82

## 2011-10-31 HISTORY — PX: LAPAROTOMY: SHX154

## 2011-10-31 LAB — CBC
HCT: 24.7 % — ABNORMAL LOW (ref 36.0–46.0)
Hemoglobin: 8.2 g/dL — ABNORMAL LOW (ref 12.0–15.0)
MCH: 26.4 pg (ref 26.0–34.0)
MCHC: 33.2 g/dL (ref 30.0–36.0)
MCV: 79.4 fL (ref 78.0–100.0)
Platelets: 206 10*3/uL (ref 150–400)
RBC: 3.11 MIL/uL — ABNORMAL LOW (ref 3.87–5.11)
RDW: 17 % — ABNORMAL HIGH (ref 11.5–15.5)
WBC: 9.2 10*3/uL (ref 4.0–10.5)

## 2011-10-31 LAB — BASIC METABOLIC PANEL
BUN: 14 mg/dL (ref 6–23)
CO2: 28 mEq/L (ref 19–32)
Calcium: 8.1 mg/dL — ABNORMAL LOW (ref 8.4–10.5)
Chloride: 103 mEq/L (ref 96–112)
Creatinine, Ser: 0.96 mg/dL (ref 0.50–1.10)
GFR calc Af Amer: 74 mL/min — ABNORMAL LOW (ref >90)
GFR calc non Af Amer: 63 mL/min — ABNORMAL LOW (ref >90)
Glucose, Bld: 147 mg/dL — ABNORMAL HIGH (ref 70–99)
Potassium: 3.2 mEq/L — ABNORMAL LOW (ref 3.5–5.1)
Sodium: 135 mEq/L (ref 135–145)

## 2011-10-31 LAB — TYPE AND SCREEN
ABO/RH(D): A POS
Antibody Screen: NEGATIVE

## 2011-10-31 SURGERY — LAPAROTOMY, EXPLORATORY
Anesthesia: General | Wound class: Clean Contaminated

## 2011-10-31 MED ORDER — NEOSTIGMINE METHYLSULFATE 1 MG/ML IJ SOLN
INTRAMUSCULAR | Status: DC | PRN
  Administered 2011-10-31: 5 mg via INTRAVENOUS

## 2011-10-31 MED ORDER — MICROFIBRILLAR COLL HEMOSTAT EX POWD
CUTANEOUS | Status: AC
  Filled 2011-10-31: qty 5

## 2011-10-31 MED ORDER — FENTANYL CITRATE 0.05 MG/ML IJ SOLN
INTRAMUSCULAR | Status: DC | PRN
  Administered 2011-10-31 (×5): 50 ug via INTRAVENOUS

## 2011-10-31 MED ORDER — MAGNESIUM HYDROXIDE 400 MG/5ML PO SUSP
30.0000 mL | Freq: Three times a day (TID) | ORAL | Status: AC
  Administered 2011-11-01 (×2): 30 mL via ORAL
  Filled 2011-10-31 (×2): qty 30

## 2011-10-31 MED ORDER — ONDANSETRON HCL 4 MG/2ML IJ SOLN: 4.0000 mg | Freq: Four times a day (QID) | INTRAMUSCULAR | Status: DC | PRN

## 2011-10-31 MED ORDER — METOPROLOL SUCCINATE ER 50 MG PO TB24
50.0000 mg | ORAL_TABLET | Freq: Every morning | ORAL | Status: DC
  Administered 2011-11-01 – 2011-11-03 (×3): 50 mg via ORAL
  Filled 2011-10-31 (×3): qty 1

## 2011-10-31 MED ORDER — HYDROCHLOROTHIAZIDE 12.5 MG PO CAPS
12.5000 mg | ORAL_CAPSULE | Freq: Every day | ORAL | Status: DC
  Administered 2011-11-01 – 2011-11-02 (×2): 12.5 mg via ORAL
  Filled 2011-10-31 (×4): qty 1

## 2011-10-31 MED ORDER — HYDROMORPHONE 0.3 MG/ML IV SOLN
INTRAVENOUS | Status: DC
  Administered 2011-10-31: 16:00:00 via INTRAVENOUS
  Administered 2011-11-01: 0.3 mg via INTRAVENOUS

## 2011-10-31 MED ORDER — INDIGOTINDISULFONATE SODIUM 8 MG/ML IJ SOLN
INTRAMUSCULAR | Status: DC | PRN
  Administered 2011-10-31: 5 mL via INTRAVENOUS

## 2011-10-31 MED ORDER — KCL IN DEXTROSE-NACL 20-5-0.45 MEQ/L-%-% IV SOLN
INTRAVENOUS | Status: AC
  Administered 2011-11-01: 1000 mL
  Filled 2011-10-31: qty 1000

## 2011-10-31 MED ORDER — ACETAMINOPHEN 10 MG/ML IV SOLN
INTRAVENOUS | Status: DC | PRN
  Administered 2011-10-31: 1000 mg via INTRAVENOUS

## 2011-10-31 MED ORDER — AMLODIPINE BESYLATE 5 MG PO TABS
5.0000 mg | ORAL_TABLET | Freq: Every day | ORAL | Status: DC
  Administered 2011-11-01 – 2011-11-02 (×2): 5 mg via ORAL
  Filled 2011-10-31 (×4): qty 1

## 2011-10-31 MED ORDER — ACETAMINOPHEN 10 MG/ML IV SOLN
1000.0000 mg | Freq: Four times a day (QID) | INTRAVENOUS | Status: AC
  Administered 2011-10-31 – 2011-11-01 (×3): 1000 mg via INTRAVENOUS
  Filled 2011-10-31 (×2): qty 100

## 2011-10-31 MED ORDER — 0.9 % SODIUM CHLORIDE (POUR BTL) OPTIME
TOPICAL | Status: DC | PRN
  Administered 2011-10-31: 1000 mL

## 2011-10-31 MED ORDER — GLYCOPYRROLATE 0.2 MG/ML IJ SOLN
INTRAMUSCULAR | Status: DC | PRN
  Administered 2011-10-31: .8 mg via INTRAVENOUS

## 2011-10-31 MED ORDER — OXYCODONE-ACETAMINOPHEN 5-325 MG PO TABS
1.0000 | ORAL_TABLET | ORAL | Status: DC | PRN
  Administered 2011-11-01 – 2011-11-02 (×2): 1 via ORAL
  Filled 2011-10-31 (×2): qty 1

## 2011-10-31 MED ORDER — DIPHENHYDRAMINE HCL 50 MG/ML IJ SOLN: 12.5000 mg | Freq: Four times a day (QID) | INTRAMUSCULAR | Status: DC | PRN

## 2011-10-31 MED ORDER — ACETAMINOPHEN 10 MG/ML IV SOLN
INTRAVENOUS | Status: AC
  Filled 2011-10-31: qty 100

## 2011-10-31 MED ORDER — LACTATED RINGERS IV SOLN
INTRAVENOUS | Status: DC
Start: 2011-10-31 — End: 2011-10-31
  Administered 2011-10-31: 12:00:00 via INTRAVENOUS

## 2011-10-31 MED ORDER — HETASTARCH-ELECTROLYTES 6 % IV SOLN
INTRAVENOUS | Status: DC | PRN
  Administered 2011-10-31: 14:00:00 via INTRAVENOUS

## 2011-10-31 MED ORDER — STERILE WATER FOR IRRIGATION IR SOLN
Status: DC | PRN
  Administered 2011-10-31: 1500 mL

## 2011-10-31 MED ORDER — DIPHENHYDRAMINE HCL 12.5 MG/5ML PO ELIX: 12.5000 mg | ORAL_SOLUTION | Freq: Four times a day (QID) | ORAL | Status: DC | PRN

## 2011-10-31 MED ORDER — CEFAZOLIN SODIUM-DEXTROSE 2-3 GM-% IV SOLR
2.0000 g | INTRAVENOUS | Status: AC
Start: 2011-10-31 — End: 2011-10-31
  Administered 2011-10-31: 2 g via INTRAVENOUS

## 2011-10-31 MED ORDER — NALOXONE HCL 0.4 MG/ML IJ SOLN: 0.4000 mg | INTRAMUSCULAR | Status: DC | PRN

## 2011-10-31 MED ORDER — PROPOFOL 10 MG/ML IV BOLUS
INTRAVENOUS | Status: DC | PRN
  Administered 2011-10-31: 200 mg via INTRAVENOUS

## 2011-10-31 MED ORDER — OLMESARTAN-AMLODIPINE-HCTZ 20-5-12.5 MG PO TABS: 1.0000 | ORAL_TABLET | Freq: Every day | ORAL | Status: DC

## 2011-10-31 MED ORDER — ONDANSETRON HCL 4 MG PO TABS: 4.0000 mg | ORAL_TABLET | Freq: Four times a day (QID) | ORAL | Status: DC | PRN

## 2011-10-31 MED ORDER — PNEUMOCOCCAL VAC POLYVALENT 25 MCG/0.5ML IJ INJ
0.5000 mL | INJECTION | INTRAMUSCULAR | Status: AC
  Filled 2011-10-31: qty 0.5

## 2011-10-31 MED ORDER — IRBESARTAN 150 MG PO TABS
150.0000 mg | ORAL_TABLET | Freq: Every day | ORAL | Status: DC
  Administered 2011-11-01 – 2011-11-02 (×2): 150 mg via ORAL
  Filled 2011-10-31 (×4): qty 1

## 2011-10-31 MED ORDER — ZOLPIDEM TARTRATE 5 MG PO TABS: 5.0000 mg | ORAL_TABLET | Freq: Every evening | ORAL | Status: DC | PRN

## 2011-10-31 MED ORDER — ROCURONIUM BROMIDE 100 MG/10ML IV SOLN
INTRAVENOUS | Status: DC | PRN
  Administered 2011-10-31: 5 mg via INTRAVENOUS
  Administered 2011-10-31: 10 mg via INTRAVENOUS
  Administered 2011-10-31: 40 mg via INTRAVENOUS

## 2011-10-31 MED ORDER — ONDANSETRON HCL 4 MG/2ML IJ SOLN
4.0000 mg | Freq: Four times a day (QID) | INTRAMUSCULAR | Status: DC | PRN
  Administered 2011-10-31 – 2011-11-01 (×3): 4 mg via INTRAVENOUS
  Filled 2011-10-31 (×3): qty 2

## 2011-10-31 MED ORDER — KCL IN DEXTROSE-NACL 20-5-0.45 MEQ/L-%-% IV SOLN
INTRAVENOUS | Status: DC
  Administered 2011-10-31: 17:00:00 via INTRAVENOUS
  Filled 2011-10-31 (×3): qty 1000

## 2011-10-31 MED ORDER — CEFAZOLIN SODIUM-DEXTROSE 2-3 GM-% IV SOLR
INTRAVENOUS | Status: AC
  Filled 2011-10-31: qty 50

## 2011-10-31 MED ORDER — ENOXAPARIN SODIUM 40 MG/0.4ML ~~LOC~~ SOLN
40.0000 mg | SUBCUTANEOUS | Status: AC
  Administered 2011-10-31: 40 mg via SUBCUTANEOUS
  Filled 2011-10-31: qty 0.4

## 2011-10-31 MED ORDER — INFLUENZA VIRUS VACC SPLIT PF IM SUSP
0.5000 mL | INTRAMUSCULAR | Status: AC
  Administered 2011-11-01: 0.5 mL via INTRAMUSCULAR
  Filled 2011-10-31: qty 0.5

## 2011-10-31 MED ORDER — MIDAZOLAM HCL 5 MG/5ML IJ SOLN
INTRAMUSCULAR | Status: DC | PRN
  Administered 2011-10-31: 2 mg via INTRAVENOUS

## 2011-10-31 MED ORDER — HYDROMORPHONE HCL PF 1 MG/ML IJ SOLN: 0.2500 mg | INTRAMUSCULAR | Status: DC | PRN

## 2011-10-31 MED ORDER — DEXAMETHASONE SODIUM PHOSPHATE 10 MG/ML IJ SOLN
INTRAMUSCULAR | Status: DC | PRN
  Administered 2011-10-31: 10 mg via INTRAVENOUS

## 2011-10-31 MED ORDER — HYDROMORPHONE HCL PF 1 MG/ML IJ SOLN
INTRAMUSCULAR | Status: DC | PRN
  Administered 2011-10-31: 1 mg via INTRAVENOUS
  Administered 2011-10-31 (×2): 0.5 mg via INTRAVENOUS

## 2011-10-31 MED ORDER — LIDOCAINE HCL (CARDIAC) 20 MG/ML IV SOLN
INTRAVENOUS | Status: DC | PRN
  Administered 2011-10-31: 100 mg via INTRAVENOUS

## 2011-10-31 MED ORDER — ONDANSETRON HCL 4 MG/2ML IJ SOLN
INTRAMUSCULAR | Status: DC | PRN
  Administered 2011-10-31: 4 mg via INTRAVENOUS

## 2011-10-31 MED ORDER — LACTATED RINGERS IV SOLN: INTRAVENOUS | Status: DC

## 2011-10-31 MED ORDER — HYDROMORPHONE 0.3 MG/ML IV SOLN
INTRAVENOUS | Status: AC
  Filled 2011-10-31: qty 25

## 2011-10-31 MED ORDER — SODIUM CHLORIDE 0.9 % IJ SOLN: 9.0000 mL | INTRAMUSCULAR | Status: DC | PRN

## 2011-10-31 MED ORDER — MICROFIBRILLAR COLL HEMOSTAT EX POWD
CUTANEOUS | Status: DC | PRN
  Administered 2011-10-31: 1 g via TOPICAL

## 2011-10-31 MED ORDER — BUPIVACAINE LIPOSOME 1.3 % IJ SUSP
20.0000 mL | Freq: Once | INTRAMUSCULAR | Status: AC
  Administered 2011-10-31: 40 mL
  Filled 2011-10-31: qty 20

## 2011-10-31 SURGICAL SUPPLY — 58 items
APL SKNCLS STERI-STRIP NONHPOA (GAUZE/BANDAGES/DRESSINGS) ×2
APPLIER CLIP 11 MED OPEN (CLIP) ×3
APR CLP MED 11 20 MLT OPN (CLIP) ×2
ATTRACTOMAT 16X20 MAGNETIC DRP (DRAPES) ×3 IMPLANT
BAG URINE DRAINAGE (UROLOGICAL SUPPLIES) ×3 IMPLANT
BENZOIN TINCTURE PRP APPL 2/3 (GAUZE/BANDAGES/DRESSINGS) ×3 IMPLANT
BLADE EXTENDED COATED 6.5IN (ELECTRODE) ×4 IMPLANT
CANISTER SUCTION 2500CC (MISCELLANEOUS) ×3 IMPLANT
CHLORAPREP W/TINT 26ML (MISCELLANEOUS) ×3 IMPLANT
CLIP APPLIE 11 MED OPEN (CLIP) ×2 IMPLANT
CLIP TI MEDIUM LARGE 6 (CLIP) ×12 IMPLANT
CLOTH BEACON ORANGE TIMEOUT ST (SAFETY) ×3 IMPLANT
CONT SPECI 4OZ STER CLIK (MISCELLANEOUS) ×2 IMPLANT
COVER SURGICAL LIGHT HANDLE (MISCELLANEOUS) ×5 IMPLANT
DRAPE UTILITY 15X26 (DRAPE) ×3 IMPLANT
DRAPE WARM FLUID 44X44 (DRAPE) ×3 IMPLANT
ELECT REM PT RETURN 9FT ADLT (ELECTROSURGICAL) ×3
ELECTRODE REM PT RTRN 9FT ADLT (ELECTROSURGICAL) ×2 IMPLANT
GAUZE SPONGE 4X4 16PLY XRAY LF (GAUZE/BANDAGES/DRESSINGS) ×3 IMPLANT
GLOVE BIO SURGEON STRL SZ7.5 (GLOVE) ×13 IMPLANT
GLOVE BIOGEL M STRL SZ7.5 (GLOVE) ×13 IMPLANT
GLOVE INDICATOR 8.0 STRL GRN (GLOVE) ×3 IMPLANT
GOWN STRL REIN XL XLG (GOWN DISPOSABLE) ×3 IMPLANT
KIT BASIN OR (CUSTOM PROCEDURE TRAY) ×3 IMPLANT
LIGASURE IMPACT 36 18CM CVD LR (INSTRUMENTS) ×2 IMPLANT
NS IRRIG 1000ML POUR BTL (IV SOLUTION) ×8 IMPLANT
PACK ABDOMINAL WL (CUSTOM PROCEDURE TRAY) ×3 IMPLANT
PENCIL BUTTON HOLSTER BLD 10FT (ELECTRODE) ×1 IMPLANT
SHEET LAVH (DRAPES) ×3 IMPLANT
SPONGE LAP 18X18 X RAY DECT (DISPOSABLE) ×4 IMPLANT
STRIP CLOSURE SKIN 1/2X4 (GAUZE/BANDAGES/DRESSINGS) ×3 IMPLANT
SUT ETHILON 1 LR 30 (SUTURE) IMPLANT
SUT PDS AB 0 CT 36 (SUTURE) ×2 IMPLANT
SUT PDS AB 0 CT1 36 (SUTURE) ×4 IMPLANT
SUT PDS AB 0 CTX 60 (SUTURE) ×6 IMPLANT
SUT SILK 0 (SUTURE)
SUT SILK 0 30XBRD TIE 6 (SUTURE) ×2 IMPLANT
SUT SILK 2 0 (SUTURE)
SUT SILK 2 0 30  PSL (SUTURE)
SUT SILK 2 0 30 PSL (SUTURE) IMPLANT
SUT SILK 2-0 18XBRD TIE 12 (SUTURE) ×2 IMPLANT
SUT VIC AB 0 CT1 27 (SUTURE) ×3
SUT VIC AB 0 CT1 27XBRD ANTBC (SUTURE) IMPLANT
SUT VIC AB 0 CT1 36 (SUTURE) ×25 IMPLANT
SUT VIC AB 2-0 CT2 27 (SUTURE) ×1 IMPLANT
SUT VIC AB 2-0 SH 27 (SUTURE) ×9
SUT VIC AB 2-0 SH 27X BRD (SUTURE) ×4 IMPLANT
SUT VIC AB 3-0 CTX 36 (SUTURE) IMPLANT
SUT VIC AB 4-0 PS1 27 (SUTURE) ×4 IMPLANT
SUT VIC AB 4-0 PS2 18 (SUTURE) ×1 IMPLANT
SUT VIC AB 4-0 PS2 27 (SUTURE) ×1 IMPLANT
SUT VICRYL 0 TIES 12 18 (SUTURE) ×2 IMPLANT
SYR BULB IRRIGATION 50ML (SYRINGE) ×1 IMPLANT
TOWEL BLUE STERILE X RAY DET (MISCELLANEOUS) ×3 IMPLANT
TOWEL OR 17X26 10 PK STRL BLUE (TOWEL DISPOSABLE) ×3 IMPLANT
TOWEL OR NON WOVEN STRL DISP B (DISPOSABLE) ×3 IMPLANT
TRAY FOLEY CATH 14FRSI W/METER (CATHETERS) ×3 IMPLANT
WATER STERILE IRR 1500ML POUR (IV SOLUTION) ×3 IMPLANT

## 2011-10-31 NOTE — Anesthesia Postprocedure Evaluation (Signed)
  Anesthesia Post-op Note  Patient: Emily Greer  Procedure(s) Performed: Procedure(s) (LRB): EXPLORATORY LAPAROTOMY (N/A) HYSTERECTOMY ABDOMINAL (N/A) SALPINGO OOPHERECTOMY (Bilateral)  Patient Location: PACU  Anesthesia Type: General  Level of Consciousness: awake and alert   Airway and Oxygen Therapy: Patient Spontanous Breathing  Post-op Pain: mild  Post-op Assessment: Post-op Vital signs reviewed, Patient's Cardiovascular Status Stable, Respiratory Function Stable, Patent Airway and No signs of Nausea or vomiting  Post-op Vital Signs: stable  Complications: No apparent anesthesia complications

## 2011-10-31 NOTE — H&P (View-Only) (Signed)
Consult Note: Gyn-Onc  Consult was requested by Dr. Leo Grosser for the evaluation of Emily Greer 61 y.o. female  CC:  Chief Complaint  Patient presents with  . Endometrial cancer    New consult    HPI: G3 P2  LNMP in 1999 with post menopausal bleeding since 05/2011.  Spotting was intermittent but becoming heavier over the past month.  Seen by Dr. Leo Grosser in 05/2011.  Pelvic UTZ was suboptimal.  MRI of the pelvis without contrast on 08/23/2011 showed a uterus measuring 15.8 x 8.1 x 10.2 cm. Multiple fibroids are seen throughout the uterus specifically in the fundus measuring 9 x 7.3 x 7.1 cm there is a right posterior corpus leiomyoma measuring 6.3 cm in greatest diameter and of her leiomyoma measuring 4.9 and 3.6 cm. In the left anterior lower uterine corpus the submucosal intracavitary lesion measuring approximately 1.3 cm in maximum diameter is mildly endovascular was identified. The ovaries were noted to be small and menopausal is no evidence of free fluid.  On 09/19/2011 she underwent dilation and curettage and hysteroscopy. Findings were notable for the fact uterus sounded to 9 cm. There were several polypoid lesions filling the endometrial cavity. The pathology returned as an endometrioid carcinoma with focal squamous differentiation FIGO grade 2.  Patient's history is notable for the diagnosis of recurrent lambda light chain myeloma made in 1999. She's currently receives  (Revelmed 21 days on off 7 days, Aridia IV every 3-4 months .  His condition is considered to be stable.  She reports vaginal spotting and an Intentional 20 pound weight loss.    Current Meds:  Outpatient Encounter Prescriptions as of 10/05/2011  Medication Sig Dispense Refill  . calcium carbonate (OS-CAL) 600 MG TABS Take 600 mg by mouth 2 (two) times daily with a meal.        . Ergocalciferol (VITAMIN D2) 2000 UNITS TABS Take by mouth daily.      [name] Kitchen HYDROcodone-acetaminophen (VICODIN) 5-500 MG per tablet One tablet  every 4 hrs as needed for pain.  Recommended but declined by pt.  30 tablet  0  . ibuprofen (ADVIL,MOTRIN) 600 MG tablet Take Ibuprofen 600 mg orally every 6 hours for 3 days then every 6 hours as needed for pain  60 tablet  2  . lenalidomide (REVLIMID) 15 MG capsule Take 1 capsule (15 mg total) by mouth daily. 21 days on with 7 days break. Auth # [id number]  21 capsule  0  . loratadine (CLARITIN) 10 MG tablet Take 10 mg by mouth daily.        . metoprolol (TOPROL-XL) 50 MG 24 hr tablet Take 50 mg by mouth daily.        . potassium chloride SA (K-DUR,KLOR-CON) 20 MEQ tablet Take 1 tablet (20 mEq total) by mouth daily.  60 tablet  0  . promethazine (PHENERGAN) 12.5 MG tablet Take 1 tablet (12.5 mg total) by mouth every 4 (four) hours as needed for nausea.  30 tablet  0  . TRIBENZOR 20-5-12.5 MG TABS         Allergy:  Allergies  Allergen Reactions  . Codeine Nausea Only    Social Hx:   History   Social History  . Marital Status: Married    Spouse Name: N/A    Number of Children: N/A  . Years of Education: N/A   Occupational History  . Not on file.   Social History Main Topics  . Smoking status: Never Smoker   . Smokeless tobacco:  Never Used  . Alcohol Use: No  . Drug Use: No  . Sexually Active: Yes    Birth Control/ Protection: Condom   Other Topics Concern  . Not on file   Social History Narrative  . No narrative on file    Past Surgical Hx:  Past Surgical History  Procedure Date  . Tubal ligation 1986  . Ectopic pregnancy surgery 1992  . Hysteroscopy w/d&c 09/27/2011    Procedure: DILATATION AND CURETTAGE /HYSTEROSCOPY;  Surgeon: Eldred Manges, MD;  Location: [facility];  Service: Gynecology;;    Past Medical Hx:  Past Medical History  Diagnosis Date  . Lambda Light Chain Myeloma 11/11/2007  . Hypertension 11/28/97  . H/O multiple myeloma 11/28/1997  . Staphylococcus aureus bacteremia 11/28/1997  . Staphylococcus epidermidis bacteremia 11/28/97  . Pregnancy  induced hypertension   . Osteoporosis 12/18/05    Increased  risk   . Increased BMI 12/18/05  . Post-menopausal bleeding 12/19/06  . Vaginal atrophy 12/19/06  . Elevated hemoglobin A1c     Borderline  . Fibroid 07/26/10    asymptomatic  . Vaginal atrophy   . PONV (postoperative nausea and vomiting)     Past Gynecological History:  G3 P2 LNMP 199.  Menarche 13 regular menses until menopause.  Ectopic pergnancy, NSVD x 2. No h/o abnormal pap tests. No LMP recorded. Patient is postmenopausal. Tubal ligation. Mammogram 2010 Colonoscopy 5 years ago.  Family Hx: No family history on file.  Review of Systems:  Constitutional  Feels well Cardiovascular  No chest pain, shortness of breath, or edema  Pulmonary  No cough or wheeze.  Gastro Intestinal  No nausea, vomitting, reports diarrhea with chemo. No bright red blood per rectum, no abdominal pain, change in bowel movement, or constipation.  Genito Urinary  No frequency, urgency, dysuria, reports vaginal bleeding Musculo Skeletal  No myalgia, arthralgia, joint swelling or pain  Neurologic  No weakness, numbness, change in gait,  Psychology  No depression, anxiety, insomnia.   Vitals:  Blood pressure 142/80, pulse 64, temperature 98.3 F (36.8 C), temperature source Oral, resp. rate 16, height 5' 3.23" (1.606 m), weight 183 lb 8 oz (83.235 kg).Body mass index is 32.27 kg/(m^2).  Physical Exam: WD in NAD Neck  Supple NROM, without any enlargements.  Lymph Node Survey No cervical supraclavicular or inguinal adenopathy Cardiovascular  Pulse normal rate, regularity and rhythm. S1 and S2 normal.  Lungs  Clear to auscultation bilateraly, without wheezes/crackles/rhonchi. Good air movement.  Psychiatry  Alert and oriented to person, place, and time  Abdomen  Normoactive bowel sounds, abdomen soft, non-tender and obese. There is a 4 cm transverse incision in the right lower quadrant.  Back No CVA tenderness Genito Urinary    Vulva/vagina: Normal external female genitalia.  No lesions. No discharge or bleeding.  Bladder/urethra:  No lesions or masses  Vagina: Without any lesions a very small amount of blood is noted in the vault.  Cervix: Normal appearing, no lesions, blood noted in the cervical os  Uterus: Large bulky consistent with leiomyoma  Adnexa: Unable to assess given the presence of the uterine leiomyoma and the patient's girth  Rectal  Good tone, no masses no cul de sac nodularity.  Extremities  No bilateral cyanosis, clubbing or edema.   Assessment/Plan:  [name]  is a 61 y.o.  year old with a FIGO grade 2 endometrial adenocarcinoma with in a fibroid uterus. The uterine size precludes a minimally invasive approach since morcellation would not  be an option in the presence of malignancy.  The plan is for total abdominal hysterectomy bilateral salpingo-oophorectomy pelvic and possible periaortic lymph node dissection on 10/31/2011. The risks of the procedure discussed with the patient were inclusive of infection bleeding damage to surrounding structures prolonged hospitalization and reoperation.  All of her questions and those of her husband were answered to their satisfaction.  The patient was advised to discontinue her multiple myeloma medications pending further recommendations from her treating medical oncologist Dr. Burney Gauze.   [name], MD, PhD 10/05/2011, 3:45 PM

## 2011-10-31 NOTE — Interval H&P Note (Signed)
History and Physical Interval Note:  10/31/2011 12:31 PM  Emily Greer  has presented today for surgery, with the diagnosis of Endometrial cancer  The various methods of treatment have been discussed with the patient and family. After consideration of risks, benefits and other options for treatment, the patient has consented to  Procedure(s) (LRB) with comments: EXPLORATORY LAPAROTOMY (N/A) HYSTERECTOMY ABDOMINAL (N/A) SALPINGO OOPHERECTOMY (Bilateral) - Possible Lymph Nodes as a surgical intervention .  The patient's history has been reviewed, patient examined, no change in status, stable for surgery.  I have reviewed the patient's chart and labs.  Questions were answered to the patient's satisfaction.     [place], [facility]

## 2011-10-31 NOTE — Op Note (Signed)
Preoperative Diagnosis: Endometrial cancer  Postoperative Diagnosis: FIGO stage I a grade 1 endometrial cancer  Procedure(s) Performed: Total laparoscopic hysterectomy, Bilateral salpingo oophorectomy,  bilateral pelvic lymph sampling  Anesthesia: Gen. endotracheal  Surgeon: Francetta Found.  Skeet Latch, M.D. PhD  Assistant Surgeon: Lahoma Crocker MD.   Assistant: Caswell Corwin RN, MSN  Specimens: Uterus cervix, ovaries fallopian tubes bilateral pelvic lymph node  Estimated Blood Loss: 750 mL.   Complications: None  Indication for Procedure: This is a 61 year old with postmenopausal bleeding. Endometrial sampling was notable for a FIGO grade 2 endometrioid endometrial cancer. Multiple uterine fibroids were appreciated which did not permit a minimally invasive approach.  Operative Findings:  14 cm there was a fundal 9 cm fibroid adherent to the urachus and abdominal wall. There was a right broad ligament fibroid adherent to the pelvic sidewall.  The ovaries and fallopian tubes appeared atrophic.    Frozen pathology was consistent with  a FIGO grade 1 endometrial cancer without any myometrial invasion. The superior uterine leiomyoma was noted to be necrotic with solid and liquid component. The area at which the necrotic leiomyoma with adherent to the urachus was benign  Procedure: Patient was taken to the operating room and placed under general endotracheal anesthesia without any difficulty. She is placed in the dorsal lithotomy position  The patient was prepped and draped   in the usual sterile fashion.  A midline vertical incision was made extending from the symphysis pubis an upper aspect of the umbilicus. The dissection was continued inferiorly to the level of the fascia. The fascial incision was extended superiorly and inferiorly.   The uterine mass was noted to be adherent to the urachus. This area was excised and sent for frozen section. The small bowel was packed into the upper abdomen.  The right round ligament was transected and the ureter was identified. The right infundibulopelvic ligament was cauterized and transected.  The peritoneum was dissected off of the lateral aspect of the right broad ligament leiomyoma. Bleeding was encountered at this area specifically there were multiple tortuous branches of the uterine vessels which required clipping. Attention was then place to the left round ligament which was transected with Bovie cautery. The retroperitoneal space was entered and the ureter identified. The left infundibulopelvic ligament was clamped transected and secured. The uterine vessels on the left were skeletonized clamped and transected. The bladder flap was dissected off the lower aspect of the uterus to the superior vagina. The paracervical tissues were dissected and inferiorly and the pedicles secured with suture.  The retroperitoneal space was entered on the right and the peritoneum incised to the level of the vesicouterine ligament anteriorly.  Significant bleeding was encountered at this point. Successive clamps were placed lateral to the myoma and dissection continued inferior to the distal aspect of the cervix.  Clamps were placed inferior to the cervix and the specimen amputated and sent for frozen section. The vaginal cuff was closed with 0 PDS in the standard [facility] fashion.  The pelvis was irrigated.  The right lateral pelvic sidewall was inspected and the course of the ureter was identified and determined to be inferior to the placement of the clips. Aveteen was placed to facilitate hemostasis.   The nodal tissue overlying the external iliac artery bilaterally was removed. The pelvis was drained and hemostasis was assured. The fascia was closed with 0 looped PDS sutures in a mass closure with sutures overlapping in the midline. The subcutaneous tissues were irrigated and drained. At that time  and an opening was noted in the most superior aspect of the fascial closure.  Interrupted 0 Vicryl sutures were placed in the fascia was once again inspected and noted to be intact.  The subcutaneous tissues were infiltrated with Enspral. Subcutaneous tissues were reapproximated with 2-0 running Vicryl suture. The skin was closed with 4-0 Vicryl subcutaneous sutures.   Sponge, lap and needle counts were correct x 3.    The patient had sequential compression devices and preoperative Lovenox for VTE prophylaxis and will receive Lovenox postoperatively.          Disposition: PACU - hemodynamically stable.         Condition:stable Foley draining clear urine.

## 2011-10-31 NOTE — Preoperative (Signed)
Beta Blockers   Reason not to administer Beta Blockers:toprolol taken 10-31-11 at 0730

## 2011-10-31 NOTE — Anesthesia Preprocedure Evaluation (Addendum)
Anesthesia Evaluation  Patient identified by MRN, date of birth, ID band Patient awake    Reviewed: Allergy & Precautions, H&P , NPO status , Patient's Chart, lab work & pertinent test results, reviewed documented beta blocker date and time   History of Anesthesia Complications (+) PONV and AWARENESS UNDER ANESTHESIA  Airway Mallampati: III TM Distance: >3 FB Neck ROM: full    Dental No notable dental hx. (+) Teeth Intact and Dental Advisory Given   Pulmonary neg pulmonary ROS,  breath sounds clear to auscultation  Pulmonary exam normal       Cardiovascular Exercise Tolerance: Good hypertension, Pt. on medications and Pt. on home beta blockers Rhythm:regular Rate:Normal     Neuro/Psych negative neurological ROS  negative psych ROS   GI/Hepatic negative GI ROS, Neg liver ROS,   Endo/Other  negative endocrine ROS  Renal/GU negative Renal ROS  negative genitourinary   Musculoskeletal   Abdominal   Peds  Hematology negative hematology ROS (+)   Anesthesia Other Findings Multiple myeloma and endometrial Ca  Reproductive/Obstetrics negative OB ROS                         Anesthesia Physical Anesthesia Plan  ASA: III  Anesthesia Plan: General   Post-op Pain Management:    Induction: Intravenous  Airway Management Planned: Oral ETT  Additional Equipment:   Intra-op Plan:   Post-operative Plan: Extubation in OR  Informed Consent: I have reviewed the patients History and Physical, chart, labs and discussed the procedure including the risks, benefits and alternatives for the proposed anesthesia with the patient or authorized representative who has indicated his/her understanding and acceptance.   Dental Advisory Given  Plan Discussed with: CRNA and Surgeon  Anesthesia Plan Comments:         Anesthesia Quick Evaluation

## 2011-10-31 NOTE — Transfer of Care (Signed)
Immediate Anesthesia Transfer of Care Note  Patient: Emily Greer  Procedure(s) Performed: Procedure(s) (LRB) with comments: EXPLORATORY LAPAROTOMY (N/A) HYSTERECTOMY ABDOMINAL (N/A) SALPINGO OOPHERECTOMY (Bilateral) -  Lymph Nodes sampling  Patient Location: PACU  Anesthesia Type: General  Level of Consciousness: awake, alert , oriented and patient cooperative  Airway & Oxygen Therapy: Patient Spontanous Breathing and Patient connected to face mask oxygen  Post-op Assessment: Report given to PACU RN, Post -op Vital signs reviewed and stable and Patient moving all extremities  Post vital signs: Reviewed and stable  Complications: No apparent anesthesia complications

## 2011-11-01 ENCOUNTER — Encounter (HOSPITAL_COMMUNITY): Payer: Self-pay | Admitting: Gynecologic Oncology

## 2011-11-01 LAB — CBC
HCT: 25.7 % — ABNORMAL LOW (ref 36.0–46.0)
Hemoglobin: 8.6 g/dL — ABNORMAL LOW (ref 12.0–15.0)
MCH: 26.4 pg (ref 26.0–34.0)
MCHC: 33.5 g/dL (ref 30.0–36.0)
MCV: 78.8 fL (ref 78.0–100.0)
Platelets: 211 10*3/uL (ref 150–400)
RBC: 3.26 MIL/uL — ABNORMAL LOW (ref 3.87–5.11)
RDW: 16.7 % — ABNORMAL HIGH (ref 11.5–15.5)
WBC: 9 10*3/uL (ref 4.0–10.5)

## 2011-11-01 LAB — BASIC METABOLIC PANEL
BUN: 11 mg/dL (ref 6–23)
CO2: 25 mEq/L (ref 19–32)
Calcium: 8.5 mg/dL (ref 8.4–10.5)
Chloride: 103 mEq/L (ref 96–112)
Creatinine, Ser: 0.83 mg/dL (ref 0.50–1.10)
GFR calc Af Amer: 88 mL/min — ABNORMAL LOW (ref >90)
GFR calc non Af Amer: 76 mL/min — ABNORMAL LOW (ref >90)
Glucose, Bld: 135 mg/dL — ABNORMAL HIGH (ref 70–99)
Potassium: 4.3 mEq/L (ref 3.5–5.1)
Sodium: 136 mEq/L (ref 135–145)

## 2011-11-01 NOTE — Care Management Note (Signed)
    Page 1 of 1   11/01/2011     1:44:32 PM   CARE MANAGEMENT NOTE 11/01/2011  Patient:  Emily Greer, Emily Greer   Account Number:  [id number]  Date Initiated:  11/01/2011  Documentation initiated by:  Sunday Spillers  Subjective/Objective Assessment:   61 yo female admitted s/p hysterectomy. PTA lived at home with spouse.     Action/Plan:   Anticipated DC Date:  11/02/2011   Anticipated DC Plan:  [place]  CM consult      Choice offered to / List presented to:             Status of service:  Completed, signed off Medicare Important Message given?   (If response is "NO", the following Medicare IM given date fields will be blank) Date Medicare IM given:   Date Additional Medicare IM given:    Discharge Disposition:  HOME/SELF CARE  Per UR Regulation:  Reviewed for med. necessity/level of care/duration of stay  If discussed at [place] of Stay Meetings, dates discussed:    Comments:

## 2011-11-01 NOTE — Progress Notes (Signed)
1 Day Post-Op Procedure(s) (LRB): EXPLORATORY LAPAROTOMY (N/A) HYSTERECTOMY ABDOMINAL (N/A) SALPINGO OOPHERECTOMY (Bilateral)  Subjective: Patient reports no complaints.  Tolerating PO intake.  Reporting minimal incisional soreness.  Denies nausea, vomiting, chest pain, dyspnea, passing flatus, or having a bowel movement.   Objective: Vital signs in last 24 hours: Temp:  [97.4 F (36.3 C)-98.1 F (36.7 C)] 98.1 F (36.7 C) (10/02 0955) Pulse Rate:  [50-88] 70  (10/02 0955) Resp:  [11-20] 20  (10/02 0955) BP: (124-164)/(67-96) 156/82 mmHg (10/02 0955) SpO2:  [94 %-100 %] 98 % (10/02 0955) Weight:  [192 lb (87.091 kg)] 192 lb (87.091 kg) (10/01 1729) Last BM Date: 10/31/11  Intake/Output from previous day: 10/01 0701 - 10/02 0700 In: 5214.6 [I.V.:4514.6; IV Piggyback:700] Out: 2475 [Urine:1775; Blood:700]  Physical Examination: General: alert, cooperative and no distress Resp: clear to auscultation bilaterally Cardio: regular rate and rhythm, S1, S2 normal, no murmur, click, rub or gallop GI: normal findings: bowel sounds normal and abd soft and obese, tender when removing dressing and incision: midline incision clean, dry, and intact Extremities: extremities normal, atraumatic, no cyanosis or edema  Labs: WBC/Hgb/Hct/Plts:  9.0/8.6/25.7/211 (10/02 0423) BUN/Cr/glu/ALT/AST/amyl/lip:  11/0.83/--/--/--/--/-- (10/02 0423)  Assessment: 61 y.o. s/p Procedure(s): EXPLORATORY LAPAROTOMY HYSTERECTOMY ABDOMINAL SALPINGO OOPHERECTOMY: stable Pain:  Pain is well-controlled on PCA.  Heme:  Hemoglobin 8.6 and Hematocrit 25.7 this am, stable post-operatively.   CV: Hypertension: Stable.  Current treatment:  amlodipine (Norvasc), hydrochlorothiazide (HCTZ), irbesartran (Avapro) and metoprolol (Lopressor, Toprol).  GI:  Tolerating po: Yes     Prophylaxis: intermittent pneumatic compression boots.  Plan: Advance diet Encourage ambulation Advance to PO medication Discontinue IV  fluids Saline lock IV   LOS: 1 day    CROSS, MELISSA DEAL 11/01/2011, 10:26 AM

## 2011-11-02 NOTE — Progress Notes (Signed)
2 Days Post-Op Procedure(s) (LRB): EXPLORATORY LAPAROTOMY (N/A) HYSTERECTOMY ABDOMINAL (N/A) SALPINGO OOPHERECTOMY (Bilateral)  Subjective: Patient reports nausea last pm.  Tolerating PO this am.  Denies nausea and vomiting at this time.  Reporting having a bowel movement this am.  Pain reported as minimal.   Objective: Vital signs in last 24 hours: Temp:  [98.1 F (36.7 C)-99 F (37.2 C)] 98.7 F (37.1 C) (10/03 0617) Pulse Rate:  [70-107] 93  (10/03 0617) Resp:  [16-22] 18  (10/03 0617) BP: (116-158)/(66-122) 116/66 mmHg (10/03 0617) SpO2:  [95 %-98 %] 96 % (10/03 0617) Last BM Date: 10/31/11  Intake/Output from previous day: 10/02 0701 - 10/03 0700 In: 240 [P.O.:240] Out: 1950 [Urine:1950]  Physical Examination: General: alert, cooperative and no distress Resp: clear to auscultation bilaterally Cardio: regular rate and rhythm, S1, S2 normal, no murmur, click, rub or gallop GI: soft, non-tender; bowel sounds normal; no masses,  no organomegaly and incision: midline abd incision with steri strips clean, dry, and intact Extremities: extremities normal, atraumatic, no cyanosis or edema  Assessment: 61 y.o. s/p Procedure(s): EXPLORATORY LAPAROTOMY HYSTERECTOMY ABDOMINAL SALPINGO OOPHERECTOMY: stable Pain:  Pain is well-controlled on oral medications.  CV: Hypertension: Stable.  Current treatment:  amlodipine (Norvasc), hydrochlorothiazide (HCTZ), irbesartran (Avapro) and metoprolol (Lopressor, Toprol).  GI:  Tolerating po: Yes.  Bowel movement this am.     Prophylaxis: intermittent pneumatic compression boots.  Plan: Encourage ambulation Encourage PO intake as tolerated Plan for discharge in the am  Encourage IS use, deep breathing, and coughing  LOS: 2 days    Jareth Pardee DEAL 11/02/2011, 8:25 AM

## 2011-11-03 MED ORDER — OXYCODONE-ACETAMINOPHEN 5-325 MG PO TABS: 1.0000 | ORAL_TABLET | ORAL | Status: DC | PRN

## 2011-11-03 NOTE — Discharge Summary (Signed)
Physician Discharge Summary  Patient ID: Emily URBAEZ MRN: [id number] DOB/AGE: Mar 11, 1950 61 y.o.  Admit date: 10/31/2011 Discharge date: 11/03/2011  Admission Diagnoses: Endometrial carcinoma  Discharge Diagnoses:  Principal Problem:  *Endometrial carcinoma  Discharged Condition: good  Hospital Course: On 10/31/2011, the patient underwent the following: Procedure(s): EXPLORATORY LAPAROTOMY HYSTERECTOMY ABDOMINAL SALPINGO OOPHERECTOMY.   The postoperative course was uneventful.  She was discharged to home on postoperative day 3 tolerating a regular diet.  Consults: None  Significant Diagnostic Studies: None  Treatments: surgery: See above  Discharge Exam: Blood pressure 107/59, pulse 79, temperature 98.6 F (37 C), temperature source Oral, resp. rate 18, height 5\' 3"  (1.6 m), weight 192 lb (87.091 kg), SpO2 100.00%. General appearance: alert, cooperative and no distress Resp: clear to auscultation bilaterally Cardio: regular rate and rhythm, S1, S2 normal, no murmur, click, rub or gallop GI: soft, non-tender; bowel sounds normal; no masses,  no organomegaly Extremities: extremities normal, atraumatic, no cyanosis or edema Incision/Wound: Midline incision with steri strips clean, dry, and intact  Disposition: 01-Home or Self Care  Discharge Orders    Future Appointments: Provider: Department: Dept Phone: Center:   11/15/2011 3:30 PM [name], MD [facility] [phone] None   12/07/2011 12:30 PM [name], MD PHD [facility] [phone] None     Future Orders Please Complete By Expires   Diet - low sodium heart healthy      Increase activity slowly      Driving Restrictions      Comments:   No driving for 2 weeks.  Do not take narcotics and drive.   Lifting restrictions      Comments:   No lifting greater than 10 lbs.   Sexual Activity Restrictions      Comments:   No sexual activity, nothing in the vagina, for 6 weeks.   Call MD for:   temperature >100.4      Call MD for:  persistant nausea and vomiting      Call MD for:  severe uncontrolled pain      Call MD for:  redness, tenderness, or signs of infection (pain, swelling, redness, odor or green/yellow discharge around incision site)      Call MD for:  difficulty breathing, headache or visual disturbances      Call MD for:  hives      Call MD for:  persistant dizziness or light-headedness      Call MD for:  extreme fatigue          Medication List     As of 11/03/2011  8:39 AM    STOP taking these medications         lenalidomide 15 MG capsule   Commonly known as: REVLIMID      TAKE these medications         calcium carbonate 600 MG Tabs   Commonly known as: OS-CAL   Take 600 mg by mouth 2 (two) times daily with a meal.      ibuprofen 200 MG tablet   Commonly known as: ADVIL,MOTRIN   Take 600 mg by mouth every 6 (six) hours as needed. For pain      loperamide 2 MG capsule   Commonly known as: IMODIUM   Take 2 mg by mouth 4 (four) times daily as needed. For diarrhea      loratadine 10 MG tablet   Commonly known as: CLARITIN   Take 10 mg by mouth every morning. For allergies during allergy season  metoprolol succinate 50 MG 24 hr tablet   Commonly known as: TOPROL-XL   Take 50 mg by mouth every morning.      Olmesartan-Amlodipine-HCTZ 20-5-12.5 MG Tabs   Take 1 tablet by mouth daily.      oxyCODONE-acetaminophen 5-325 MG per tablet   Commonly known as: PERCOCET/ROXICET   Take 1-2 tablets by mouth every 4 (four) hours as needed (moderate to severe pain (when tolerating fluids)).      potassium chloride SA 20 MEQ tablet   Commonly known as: K-DUR,KLOR-CON   Take 1 tablet (20 mEq total) by mouth daily.      Vitamin D2 2000 UNITS Tabs   Take by mouth daily.      To restart the Revlimid per Dr. Antonieta Pert recommendations.     Follow-up Information    Follow up with [name], MD PHD. On 12/07/2011. (at 12:30.  Arrive at 12:00 to  register.)    Contact information:   [place] [address] [phone]         Signed: Illene Sweeting, [place] 11/03/2011, 8:39 AM

## 2011-11-07 ENCOUNTER — Ambulatory Visit: Payer: BC Managed Care – PPO | Admitting: Hematology & Oncology

## 2011-11-15 ENCOUNTER — Ambulatory Visit (HOSPITAL_BASED_OUTPATIENT_CLINIC_OR_DEPARTMENT_OTHER): Payer: BC Managed Care – PPO | Admitting: Hematology & Oncology

## 2011-11-15 VITALS — BP 125/71 | HR 86 | Temp 98.0°F | Resp 16 | Ht 63.0 in | Wt 181.0 lb

## 2011-11-15 DIAGNOSIS — C9000 Multiple myeloma not having achieved remission: Secondary | ICD-10-CM

## 2011-11-15 NOTE — Progress Notes (Signed)
This office note has been dictated.

## 2011-11-16 ENCOUNTER — Other Ambulatory Visit: Payer: Self-pay | Admitting: *Deleted

## 2011-11-16 DIAGNOSIS — C9000 Multiple myeloma not having achieved remission: Secondary | ICD-10-CM

## 2011-11-16 MED ORDER — LENALIDOMIDE 15 MG PO CAPS: 15.0000 mg | ORAL_CAPSULE | Freq: Every day | ORAL | Status: DC

## 2011-11-16 NOTE — Telephone Encounter (Signed)
Received a call from the pt regarding a Revlimid rx. Yesterday, after her visit with Dr Marin Olp, she was told to resume her Revlimid. She does not have have any left. Wants to know if a refill can be sent in to [facility]. Confirmed with Dr Marin Olp that she is to continue taking this 21 days on 7 days off. Obtained auth # [id number].. Will have Dr Marin Olp sign the rx and fax it back to [phone].

## 2011-11-16 NOTE — Progress Notes (Signed)
CC:   Eldred Manges, M.D. Jeanann Lewandowsky, M.D. Jeanann Lewandowsky, MD  DIAGNOSES: 1. Recurrent lambda light chain myeloma. 2. Stage I uterine adenocarcinoma-resected.  CURRENT THERAPY: 1. Revlimid 15 mg p.o. daily. 2. Aredia 90 mg IV every 4 months.  INTERIM HISTORY:  Ms. Knipfer comes in for follow-up.  She had a hysterectomy.  She was operated on in early October.  She had a hysterectomy along with removal of the ovaries.  The pathology report [id number]) showed a stage I uterine endometrioid carcinoma.  Lymph nodes were all negative.  Her ovaries were also removed.  She is a stage I (T1a N0 M0).  The tumor itself measured 0.5 cm.  She is recovering.  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  When we last saw her, her 24-hour urine showed her lambda light chain to be 195 mg per day.  She has been off Revlimid since before surgery.  I think we should get her back on Revlimid.  I am still not convinced that the Revlimid was the cause for this uterine malignancy.  She has had uterine fibroids for many years.  She has had no other issues.  There has been no leg pain or swelling. She has had no cough.  She has had no back issues.  There has been no headache.  PHYSICAL EXAMINATION:  General:  This is a well-developed, well- nourished African American female in no obvious distress.  Vital Signs: Temperature of 98, pulse 86, respiratory rate 18, blood pressure 125/71. Weight is 181.  Head and Neck:  Normocephalic, atraumatic skull.  There are no ocular or oral lesions.  There are no palpable cervical or supraclavicular lymph nodes.  Lungs:  Clear bilaterally.  Cardiac: Regular rate and rhythm with a normal S1 and S2.  There are no murmurs, rubs, or bruits.  Abdomen:  Soft with good bowel sounds.  There is no palpable abdominal mass.  There is no palpable hepatosplenomegaly.  She has a healing laparotomy scar.  Extremities:  No clubbing, cyanosis, or edema.   Neurological:  No focal neurological deficits.  IMPRESSION:  Mr. Stebelton is a 61 year old African American female with history of recurrent lambda light chain myeloma.  We have been able to keep this at a very low level.  However, recently there was an increase in the light chain in her urine.  I just wonder if some of this increase may not have been because of what was going on with the uterus.  As such, I want to repeat her 24-hour urine in about 4-6 weeks.  I think this would be reasonable.  I want her back on the Revlimid.  If we find that 24-hour urine is more prominent, then we are going to have to reevaluate her with restaging studies.  Ms. Beerman has done incredibly well.  She had her transplant back in 2002.  I will plan to get her back to see me in December now.    ______________________________ [name], M.D. PRE/MEDQ  D:  11/15/2011  T:  11/16/2011  Job:  [id number]

## 2011-11-21 ENCOUNTER — Encounter: Payer: Self-pay | Admitting: *Deleted

## 2011-11-21 NOTE — Progress Notes (Signed)
Pt called to report that she had not received her Revlimid from [facility].  Our records show the rx was sent to [facility] in 11/16/11.  [facility] called and they state everything is ready to be shipped, they need to call pt and set up a delivery date.  They will do this today.  Pt given this information.  Asked her to call us back if they don't call her by 3pm today.  Pt voiced understanding.

## 2011-11-30 ENCOUNTER — Other Ambulatory Visit: Payer: Self-pay | Admitting: *Deleted

## 2011-11-30 NOTE — Telephone Encounter (Signed)
error 

## 2011-12-07 ENCOUNTER — Ambulatory Visit: Payer: BC Managed Care – PPO | Attending: Gynecologic Oncology | Admitting: Gynecologic Oncology

## 2011-12-07 ENCOUNTER — Encounter: Payer: Self-pay | Admitting: Gynecologic Oncology

## 2011-12-07 VITALS — BP 122/64 | HR 72 | Temp 98.6°F | Resp 20 | Ht 63.23 in | Wt 183.7 lb

## 2011-12-07 DIAGNOSIS — C549 Malignant neoplasm of corpus uteri, unspecified: Secondary | ICD-10-CM | POA: Insufficient documentation

## 2011-12-07 DIAGNOSIS — Z90710 Acquired absence of both cervix and uterus: Secondary | ICD-10-CM | POA: Insufficient documentation

## 2011-12-07 DIAGNOSIS — C541 Malignant neoplasm of endometrium: Secondary | ICD-10-CM

## 2011-12-07 NOTE — Patient Instructions (Addendum)
Stage 1A Grade 2 Endometrial cancer. -Recommendation is for routine surveillance with frequent pelvic exams and visits with annual pap smear.  We will start with visits every 6 months x 2 years, then every 6 months for 3 more years, at which time she can return to annual visits.  Discussed signs and symptoms of recurrence including vaginal bleeding or discharge, leg pain or swelling and changes in bowel or bladder habits.  Return to clinic in  6 months  Followup with Dr. Leo Grosser in 12 months for physical examination and Pap test.     Thank you very much [name] for allowing me to provide care for you today.  I appreciate your confidence in choosing our Gynecologic Oncology team.  If you have any questions about your visit today please call our office and we will get back to you as soon as possible.  Francetta Found. Ardean Simonich MD., PhD Gynecologic Oncology

## 2011-12-07 NOTE — Progress Notes (Signed)
  HPI:  Emily Greer is a 61 y.o. year old G3P2 initially seen in consultation on 10/04/2001  grade 1  endometrial cancer  She then underwent a  total abdominal hysterectomy bilateral salpingo-oophorectomy bilateral pelvic lymph node dissection on  [id number] without complications.  Her postoperative course was  uncomplicated.  Her final pathologic diagnosis is a Stage  1A Grade  2 endometrioid endometrial cancer with  negative lymphovascular space invasion,  2/20 (10%) of myometrial invasion and negative lymph nodes.  She is seen today for a postoperative check and to discuss her pathology results and treatment plan.  Since discharge from the hospital, she is feeling well .  She has improving appetite, normal bowel and bladder function, and pain controlled with minimal PO medication. She has no other complaints today.    Review of systems: Constitutional:  She has no fever or chills. Cardiovascular: No chest pain, palpitations or edema. Respiratory:  No shortness of breath, wheezing or cough Gastrointestinal: She has normal bowel movements without diarrhea or constipation. She denies any nausea or vomiting. Genitourinary:  She denies pelvic pain, pelvic pressure or changes in her urinary function. She has no irregular vaginal bleeding or vaginal discharge.    Physical Exam: Blood pressure 122/64, pulse 72, temperature 98.6 F (37 C), temperature source Oral, resp. rate 20, height 5' 3.23" (1.606 m), weight 183 lb 11.2 oz (83.326 kg). General: Well dressed, well nourished in no apparent distress.   Lungs:  Clear to auscultation bilaterally.  No wheezes. Cardiovascular:  Regular rate and rhythm.  No murmurs or rubs. Abdomen:  Soft, nontender, nondistended.  No palpable masses.  No hepatosplenomegaly.  No ascites. Normal bowel sounds.  No hernias.  Incision is clean dry and intact without any evidence of erythema or hernia   Genitourinary: Normal EGBUS  Vaginal cuff intact.  No bleeding or  discharge.  No cul de sac fullness. Extremities: No cyanosis, clubbing or edema.  No calf tenderness or erythema Musculoskeletal: No pain, normal strength and range of motion.  Assessment:    61 y.o. year old with Stage1AGrade 2 endometrioid endometrial cancer.   No LVSI, 10% myometrial invasion, negative  pelvic washings and negative  lymph nodes.   Plan: 1) Pathology reports reviewed today 2) Treatment counseling -Recommendation is for routine surveillance with frequent pelvic exams and visits with annual pap smear.  We will start with visits every 6 months x 2 years, then every 6 months for 3 more years, at which time she can return to annual visits.  Discussed signs and symptoms of recurrence including vaginal bleeding or discharge, leg pain or swelling and changes in bowel or bladder habits. She was given the opportunity to ask questions, which were answered to her satisfaction, and she is agreement with the above mentioned plan of care.  3)  Return to clinic in  6 months  Followup with Dr. Leo Grosser in 12 months for physical examination and Pap test.   No heavy lifting for 6 months

## 2011-12-14 ENCOUNTER — Other Ambulatory Visit: Payer: Self-pay | Admitting: *Deleted

## 2011-12-14 DIAGNOSIS — C9000 Multiple myeloma not having achieved remission: Secondary | ICD-10-CM

## 2011-12-14 MED ORDER — LENALIDOMIDE 15 MG PO CAPS: 15.0000 mg | ORAL_CAPSULE | Freq: Every day | ORAL | Status: DC

## 2011-12-14 NOTE — Telephone Encounter (Signed)
Received a refill request from [facility] for Revlimid. She is taking this 21 days on 7 days off. Obtained auth # [id number] Will have Dr Marin Olp sign the rx and fax it back to [phone].

## 2011-12-18 ENCOUNTER — Telehealth: Payer: Self-pay | Admitting: Obstetrics and Gynecology

## 2011-12-18 NOTE — Telephone Encounter (Signed)
TC to pt. States had surgery by Dr Skeet Latch 10/31/11. Original plan was to be out of work and return after Thanksgiving. Has received notice from Dr Skeet Latch that is released as of 12/11/11.  Sent job description to Dr Skeet Latch to see if can be extended.  Questioning if Dr Brent General has any advice or suggestions. Explained decision will probably be Dr Leone Brand but will inform Dr [facility]. Pt agreeable.

## 2011-12-19 ENCOUNTER — Telehealth: Payer: Self-pay | Admitting: Obstetrics and Gynecology

## 2011-12-19 NOTE — Telephone Encounter (Signed)
TC to pt.  Per DR VPH informed decision to return to work is made by primary surgeon but pt may contact Dr Jonette Eva to see if he recommends longer recovery time due to  Underlying hematologic condition.  Pt verbalizes comprehension.

## 2011-12-19 NOTE — Telephone Encounter (Signed)
Message copied by Tania Ade on Tue Dec 19, 2011  8:27 AM ------      Message from: Kendall Flack P      Created: Mon Dec 18, 2011  7:25 PM       Usually only primary surgeon can decide on time out of work for surgery,so I have no jurisdiction over that.  However, Dr. Jonette Eva, her hematologist may want her to stay out longer if he feels her system is weakened by her underlying hematologic condition and will require a longer time for recovery. She can touch base with him with just that question.      ----- Message -----         From: Annabell Howells, RN         Sent: 12/18/2011   5:01 PM           To: Eldred Manges, MD            Pt questioning if you have any advice or suggestion about extending leave from work. Had surgery by Dr Skeet Latch 10/31/11 and plan was to be out of work x 8 weeks and return after Thanksgiving. However, was released 12/11/11. States sent job description to Dr Skeet Latch.  Informed decision may need to be made by Dr Skeet Latch.

## 2012-01-10 LAB — UIFE/LIGHT CHAINS/TP QN, 24-HR UR
Albumin, U: DETECTED
Alpha 1, Urine: DETECTED — AB
Alpha 2, Urine: DETECTED — AB
Beta, Urine: DETECTED — AB
Free Kappa Lt Chains,Ur: 3.08 mg/dL — ABNORMAL HIGH (ref 0.14–2.42)
Free Kappa/Lambda Ratio: 0.33 ratio — ABNORMAL LOW (ref 2.04–10.37)
Free Lambda Excretion/Day: 101.75 mg/d
Free Lambda Lt Chains,Ur: 9.25 mg/dL — ABNORMAL HIGH (ref 0.02–0.67)
Free Lt Chn Excr Rate: 33.88 mg/d
Gamma Globulin, Urine: DETECTED — AB
Time: 24 hours
Total Protein, Urine-Ur/day: 151 mg/d — ABNORMAL HIGH (ref 10–140)
Total Protein, Urine: 13.7 mg/dL
Volume, Urine: 1100 mL

## 2012-01-15 ENCOUNTER — Ambulatory Visit (HOSPITAL_BASED_OUTPATIENT_CLINIC_OR_DEPARTMENT_OTHER): Payer: BC Managed Care – PPO | Admitting: Hematology & Oncology

## 2012-01-15 ENCOUNTER — Ambulatory Visit (HOSPITAL_BASED_OUTPATIENT_CLINIC_OR_DEPARTMENT_OTHER): Payer: BC Managed Care – PPO

## 2012-01-15 ENCOUNTER — Other Ambulatory Visit (HOSPITAL_BASED_OUTPATIENT_CLINIC_OR_DEPARTMENT_OTHER): Payer: BC Managed Care – PPO | Admitting: Lab

## 2012-01-15 ENCOUNTER — Encounter: Payer: Self-pay | Admitting: *Deleted

## 2012-01-15 VITALS — BP 126/69 | HR 68 | Temp 98.2°F | Resp 16 | Wt 184.0 lb

## 2012-01-15 DIAGNOSIS — C9000 Multiple myeloma not having achieved remission: Secondary | ICD-10-CM

## 2012-01-15 DIAGNOSIS — C549 Malignant neoplasm of corpus uteri, unspecified: Secondary | ICD-10-CM

## 2012-01-15 DIAGNOSIS — Z8579 Personal history of other malignant neoplasms of lymphoid, hematopoietic and related tissues: Secondary | ICD-10-CM

## 2012-01-15 DIAGNOSIS — D649 Anemia, unspecified: Secondary | ICD-10-CM

## 2012-01-15 LAB — CMP (CANCER CENTER ONLY)
ALT(SGPT): 23 U/L (ref 10–47)
AST: 19 U/L (ref 11–38)
Albumin: 4.2 g/dL (ref 3.3–5.5)
Alkaline Phosphatase: 50 U/L (ref 26–84)
BUN, Bld: 15 mg/dL (ref 7–22)
CO2: 30 mEq/L (ref 18–33)
Calcium: 9.4 mg/dL (ref 8.0–10.3)
Chloride: 98 mEq/L (ref 98–108)
Creat: 0.8 mg/dl (ref 0.6–1.2)
Glucose, Bld: 84 mg/dL (ref 73–118)
Potassium: 3 mEq/L — ABNORMAL LOW (ref 3.3–4.7)
Sodium: 142 mEq/L (ref 128–145)
Total Bilirubin: 0.7 mg/dl (ref 0.20–1.60)
Total Protein: 7.6 g/dL (ref 6.4–8.1)

## 2012-01-15 LAB — CBC WITH DIFFERENTIAL (CANCER CENTER ONLY)
BASO#: 0.1 10*3/uL (ref 0.0–0.2)
BASO%: 1.8 % (ref 0.0–2.0)
EOS%: 6.3 % (ref 0.0–7.0)
Eosinophils Absolute: 0.2 10*3/uL (ref 0.0–0.5)
HCT: 34 % — ABNORMAL LOW (ref 34.8–46.6)
HGB: 11.1 g/dL — ABNORMAL LOW (ref 11.6–15.9)
LYMPH#: 1.3 10*3/uL (ref 0.9–3.3)
LYMPH%: 32.6 % (ref 14.0–48.0)
MCH: 25.6 pg — ABNORMAL LOW (ref 26.0–34.0)
MCHC: 32.6 g/dL (ref 32.0–36.0)
MCV: 79 fL — ABNORMAL LOW (ref 81–101)
MONO#: 0.5 10*3/uL (ref 0.1–0.9)
MONO%: 12.5 % (ref 0.0–13.0)
NEUT#: 1.8 10*3/uL (ref 1.5–6.5)
NEUT%: 46.8 % (ref 39.6–80.0)
Platelets: 240 10*3/uL (ref 145–400)
RBC: 4.33 10*6/uL (ref 3.70–5.32)
RDW: 16.3 % — ABNORMAL HIGH (ref 11.1–15.7)
WBC: 3.8 10*3/uL — ABNORMAL LOW (ref 3.9–10.0)

## 2012-01-15 MED ORDER — SODIUM CHLORIDE 0.9 % IV SOLN
90.0000 mg | Freq: Once | INTRAVENOUS | Status: AC
  Administered 2012-01-15: 90 mg via INTRAVENOUS
  Filled 2012-01-15: qty 500

## 2012-01-15 NOTE — Patient Instructions (Addendum)
Pamidronate injection  What is this medicine?  PAMIDRONATE (pa mi DROE nate) slows calcium loss from bones. It is used to treat high calcium blood levels from cancer or Paget's disease. It is also used to treat bone pain and prevent fractures from certain cancers that have spread to the bone. This medicine may be used for other purposes; ask your health care provider or pharmacist if you have questions.  What should I tell my health care provider before I take this medicine?  They need to know if you have any of these conditions: -aspirin-sensitive asthma -dental disease -kidney disease -an unusual or allergic reaction to pamidronate, other medicines, foods, dyes, or preservatives -pregnant or trying to get pregnant -breast-feeding  How should I use this medicine?  This medicine is for infusion into a vein. It is given by a health care professional in a hospital or clinic setting. Talk to your pediatrician regarding the use of this medicine in children. This medicine is not approved for use in children. Overdosage: If you think you have taken too much of this medicine contact a poison control center or emergency room at once. NOTE: This medicine is only for you. Do not share this medicine with others.  What if I miss a dose?  This does not apply.  What may interact with this medicine?  -certain antibiotics given by injection -medicines for inflammation or pain like ibuprofen, naproxen -some diuretics like bumetanide, furosemide -cyclosporine -parathyroid hormone -tacrolimus -teriparatide -thalidomide This list may not describe all possible interactions. Give your health care provider a list of all the medicines, herbs, non-prescription drugs, or dietary supplements you use. Also tell them if you smoke, drink alcohol, or use illegal drugs. Some items may interact with your medicine.  What should I watch for while using this medicine?  Visit your doctor or health care  professional for regular checkups. It may be some time before you see the benefit from this medicine. Do not stop taking your medicine unless your doctor tells you to. Your doctor may order blood tests or other tests to see how you are doing. Women should inform their doctor if they wish to become pregnant or think they might be pregnant. There is a potential for serious side effects to an unborn child. Talk to your health care professional or pharmacist for more information. You should make sure that you get enough calcium and vitamin D while you are taking this medicine. Discuss the foods you eat and the vitamins you take with your health care professional. Some people who take this medicine have severe bone, joint, and/or muscle pain. This medicine may also increase your risk for a broken thigh bone. Tell your doctor right away if you have pain in your upper leg or groin. Tell your doctor if you have any pain that does not go away or that gets worse.  What side effects may I notice from receiving this medicine?  Side effects that you should report to your doctor or health care professional as soon as possible: -allergic reactions like skin rash, itching or hives, swelling of the face, lips, or tongue -black or tarry stools -changes in vision -eye inflammation, pain -high blood pressure -jaw pain, especially burning or cramping -muscle weakness -numb, tingling pain -swelling of feet or hands -trouble passing urine or change in the amount of urine -unable to move easily Side effects that usually do not require medical attention (report to your doctor or health care professional if they continue or  are bothersome): -bone, joint, or muscle pain -constipation -dizzy, drowsy -fever -headache -loss of appetite -nausea, vomiting -pain at site where injected This list may not describe all possible side effects. Call your doctor for medical advice about side effects. You may report side effects  to FDA at 1-800-FDA-1088.  Where should I keep my medicine?  This drug is given in a hospital or clinic and will not be stored at home. NOTE: This sheet is a summary. It may not cover all possible information. If you have questions about this medicine, talk to your doctor, pharmacist, or health care provider.  2012, Elsevier/Gold Standard. (07/15/2010 8:49:49 AM)  Potassium Content of Foods  Potassium is a mineral. The body needs potassium to control blood pressure and to keep the muscles and nervous system healthy. Most foods contain potassium. Eating a variety of foods in the right amounts will help control the level of potassium in your body.  Kidney problems can cause there to be too much potassium in the body. If this happens, you may need to lower the amount of potassium in your diet. Some medicines, such as diuretics, may cause your body to lose too much potassium. If this happens, you may need to increase the amount of potassium in your diet.  COMMON SERVING SIZES The list below tells you how big or small some common portion sizes are:  1 oz.........4 stacked dice.  3 oz........[name] KitchenDeck of cards.  1 tsp.......[name] KitchenTip of little finger.  1 tbs......[name] KitchenMarland KitchenThumb.  2 tbs.......[name] KitchenGolf ball.   cup......[name] KitchenHalf of a fist.  1 cup.......[name] KitchenA fist.   FOODS AND DRINKS HIGH IN POTASSIUM More than 250 mg per serving.  Bran cereals and other bran products.  Milk (skim, 1%, 2%, whole).  Buttermilk.  Yogurt.  Avocados.  Bananas.  Dried fruits.  Kiwis.  Oranges.  Prunes.  Raisins.  Baked beans.  Spinach.  Tomatoes.  Peanut butter.  Nuts.  Tofu.  Potatoes.   FOODS MODERATE IN POTASSIUM Between 150 to 250 mg per serving.  Cherries.  Mangoes.  Asparagus.  Peas.  Zucchini.  Celery.  Cantaloupes.  Fresh peaches.  Broccoli stalks.  Peppers.  Figs.  Fresh pears.  Kale.  Summer squashes.   FOODS LOW IN POTASSIUM Less than 150 mg per  serving.  Pasta.  Rice.  Cottage cheese.  Cheddar cheese.  Apples.  Grapes.  Pineapple.  Raspberries.  Strawberries.  Watermelon.  Green beans.  Cabbage.  Cauliflower.  Corn.  Mushrooms.  Onions.  Eggs. Document Released: 08/30/2004 Document Revised: 04/10/2011 Document Reviewed: 06/02/2011 [facility] Patient Information 2013 [place].

## 2012-01-15 NOTE — Progress Notes (Signed)
This office note has been dictated.

## 2012-01-15 NOTE — Progress Notes (Signed)
K+ 3.0 per C-MET from today. Asked the pt if she was taking any K+ supplements and she stated briefly prior to her surgery with Dr. Heide Spark but not since. She is on a "fluid pill" combination for her blood pressure prescribed by Dr Carlis Abbott. Faxed him a copy of her lab report from today. Also gave the pt a print out of potassium rich foods.

## 2012-01-15 NOTE — Progress Notes (Signed)
CC:   Emily Greer, M.D. Jeanann Lewandowsky, M.D. Jeanann Lewandowsky, MD  DIAGNOSIS: 1. Recurrent lambda light chain myeloma. 2. History of stage I uterine adenocarcinoma.  CURRENT THERAPY: 1. Revlimid 15 mg p.o. daily (21 days on/7 days off). 2. Aredia 90 mg IV q.5months.  INTERVAL HISTORY:  Emily Greer comes in for followup.  She is probably going to retire at the end of this year.  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  She has done an incredible job.  She thinks that now is the time to retire.  She is back on Revlimid.  She did have removal of her uterus back in October.  She has stage I endometrial carcinoma.  The tumor only measured 0.5 cm.  Thankfully, when we rechecked a 24-hour urine, the lambda light chain had come down to 101 mg per day.  Previously, it was 195 mg per day.  She has no problems taking the Revlimid.  She has had no change in bowel or bladder habits.  She has had no leg swelling.  There have been no rashes.  She has had no fever, sweats, or chills.  She is taking 1 full aspirin a day with the Revlimid to help with any coagulation issues.  PHYSICAL EXAMINATION:  This is a well-developed, well-nourished black female in no obvious distress.  Vital signs:  98.2, pulse 58, respiratory rate 16, blood pressure 126/69.  Weight is 184.  Head/neck: Normocephalic, atraumatic skull.  There are no ocular or oral lesions. There are no palpable cervical or supraclavicular lymph nodes.  Lungs: Clear bilaterally.  Cardiac:  Regular rate and rhythm with a normal S1 and S2.  There are no murmurs, rubs or bruits.  Abdomen:  Soft with good bowel sounds.  There is no palpable abdominal mass.  She has no fluid wave.  There is no guarding or rebound tenderness.  There is no palpable hepatosplenomegaly.  Back:  No tenderness over the spine, ribs, or hips. Extremities:  No clubbing, cyanosis or edema.  Neurological:  No  focal neurological deficits.  LABORATORY STUDIES:  White cell count is 3.8, hemoglobin 11.1, hematocrit 34, platelet count 240.  IMPRESSION:  Emily Greer is a nice 61 year old African American female with a history of recurrent lambda light chain myeloma.  Again, I just have her on Revlimid.  She has done very well with the Revlimid.  Her light chains have been really quite low.  I have not seen any evidence of progression.  We will go ahead and plan to get her back in 4 more months.  I do not see that we need to do any additional studies on her given the low level of her light chains.    ______________________________ [name], M.D. PRE/MEDQ  D:  01/15/2012  T:  01/15/2012  Job:  [id number]

## 2012-01-16 LAB — KAPPA/LAMBDA LIGHT CHAINS
Kappa free light chain: 1.52 mg/dL (ref 0.33–1.94)
Kappa:Lambda Ratio: 0.15 — ABNORMAL LOW (ref 0.26–1.65)
Lambda Free Lght Chn: 9.89 mg/dL — ABNORMAL HIGH (ref 0.57–2.63)

## 2012-01-22 ENCOUNTER — Other Ambulatory Visit: Payer: Self-pay | Admitting: *Deleted

## 2012-01-22 DIAGNOSIS — C9000 Multiple myeloma not having achieved remission: Secondary | ICD-10-CM

## 2012-01-22 MED ORDER — LENALIDOMIDE 15 MG PO CAPS: 15.0000 mg | ORAL_CAPSULE | Freq: Every day | ORAL | Status: DC

## 2012-02-14 ENCOUNTER — Telehealth: Payer: Self-pay | Admitting: Gynecologic Oncology

## 2012-02-14 NOTE — Telephone Encounter (Signed)
Message left asking the patient to please call the office to discuss complaints of vaginal bleeding.

## 2012-02-20 ENCOUNTER — Encounter: Payer: Self-pay | Admitting: Gynecologic Oncology

## 2012-02-20 ENCOUNTER — Ambulatory Visit: Payer: BC Managed Care – PPO | Attending: Gynecologic Oncology | Admitting: Gynecologic Oncology

## 2012-02-20 VITALS — BP 138/80 | HR 80 | Temp 97.9°F | Resp 16 | Ht 63.23 in | Wt 185.8 lb

## 2012-02-20 DIAGNOSIS — N939 Abnormal uterine and vaginal bleeding, unspecified: Secondary | ICD-10-CM

## 2012-02-20 DIAGNOSIS — I10 Essential (primary) hypertension: Secondary | ICD-10-CM | POA: Insufficient documentation

## 2012-02-20 DIAGNOSIS — C541 Malignant neoplasm of endometrium: Secondary | ICD-10-CM

## 2012-02-20 DIAGNOSIS — C549 Malignant neoplasm of corpus uteri, unspecified: Secondary | ICD-10-CM | POA: Insufficient documentation

## 2012-02-20 DIAGNOSIS — Z87898 Personal history of other specified conditions: Secondary | ICD-10-CM | POA: Insufficient documentation

## 2012-02-20 DIAGNOSIS — Z90710 Acquired absence of both cervix and uterus: Secondary | ICD-10-CM | POA: Insufficient documentation

## 2012-02-20 DIAGNOSIS — Z9079 Acquired absence of other genital organ(s): Secondary | ICD-10-CM | POA: Insufficient documentation

## 2012-02-20 DIAGNOSIS — N898 Other specified noninflammatory disorders of vagina: Secondary | ICD-10-CM | POA: Insufficient documentation

## 2012-02-20 NOTE — Patient Instructions (Signed)
Vaginal apex biopsied and silver nitrate applied to the area.   Will contact you with results.   Return to clinic in 3 months  Followup with Dr. Leo Grosser in 6 months for physical examination and Pap test.   Pelvic rest for 2 weeks

## 2012-02-20 NOTE — Progress Notes (Signed)
OFFICE VISIT  CC:   Endometrial cancer surveillance, vaginal bleeding.    HPI:  Emily Greer is a 62 y.o. year old G3P2 initially seen in consultation on 10/04/2001  grade 1  endometrial cancer  She then underwent a  total abdominal hysterectomy bilateral salpingo-oophorectomy bilateral pelvic lymph node dissection on  [id number] without complications.  Her postoperative course was  uncomplicated.  Her final pathologic diagnosis is a Stage  1A Grade  2 endometrioid endometrial cancer with  negative lymphovascular space invasion,  2/20 (10%) of myometrial invasion and negative lymph nodes.  She is seen today for a  six-month surveillance check. The patient reports post coital vaginal bleeding one week ago that was self limiting. She denies any nausea vomiting fever chills she denies any hematuria or hematochezia. She denies any abdominal bloating or changes in appetite Past Medical History  Diagnosis Date  . Lambda light chain myeloma 11/11/2007  . Hypertension 11/28/97  . H/O multiple myeloma 11/28/1997  . Staphylococcus aureus bacteremia 11/28/1997  . Staphylococcus epidermidis bacteremia 11/28/97  . Pregnancy induced hypertension   . Osteoporosis 12/18/05    Increased  risk   . Increased BMI 12/18/05  . Post-menopausal bleeding 12/19/06  . Vaginal atrophy 12/19/06  . Elevated hemoglobin A1c     Borderline  . Fibroid 07/26/10    asymptomatic  . Vaginal atrophy   . PONV (postoperative nausea and vomiting)     nausea in past, none recent   Colonoscopy scheduled for 02/29/2012 Mammogram 2013 wnl per patient  Past Surgical History  Procedure Date  . Tubal ligation 1986  . Ectopic pregnancy surgery 1992  . Hysteroscopy w/d&c 09/27/2011    Procedure: DILATATION AND CURETTAGE /HYSTEROSCOPY;  Surgeon: Eldred Manges, MD;  Location: [facility];  Service: Gynecology;;  . Laparotomy 10/31/2011    Procedure: EXPLORATORY LAPAROTOMY;  Surgeon: [name], MD PHD;  Location: [facility];   Service: Gynecology;  Laterality: N/A;  . Abdominal hysterectomy 10/31/2011    Procedure: HYSTERECTOMY ABDOMINAL;  Surgeon: [name], MD PHD;  Location: [facility];  Service: Gynecology;  Laterality: N/A;  . Salpingoophorectomy 10/31/2011    Procedure: SALPINGO OOPHERECTOMY;  Surgeon: [name], MD PHD;  Location: [facility];  Service: Gynecology;  Laterality: Bilateral;   Lymph Nodes sampling   Social History:  Accompanied by her husband.  Stable work envirnment  Review of systems: Constitutional:  She has no fever or chills. Cardiovascular: No chest pain, palpitations or edema. Respiratory:  No shortness of breath, wheezing or cough Gastrointestinal: She has normal bowel movements without diarrhea or constipation. She denies any nausea or vomiting. Genitourinary:  She denies pelvic pain, pelvic pressure or changes in her urinary function. She has post quite so vaginal bleeding on 2 occasions she denies vaginal discharge Musculoskeletal:  No changes in gait or joint pain Otherwise uninformative 10 point review of system    Physical Exam: Blood pressure 138/80, pulse 80, temperature 97.9 F (36.6 C), resp. rate 16, height 5' 3.23" (1.606 m), weight 185 lb 12.8 oz (84.278 kg). General: Well dressed, well nourished in no apparent distress.   Lungs:  Clear to auscultation bilaterally.  No wheezes. Cardiovascular:  Regular rate and rhythm.  No murmurs or rubs. Abdomen:  Soft, nontender, nondistended.  No palpable masses.  No hepatosplenomegaly.  No ascites. Normal bowel sounds.  No hernias.  Incision is clean dry and intact without any evidence of erythema or hernia   Genitourinary: Normal EGBUS  Vaginal cuff intact. Erythema noted at  the right vaginal. The area was biopsied.  No bleeding or discharge.  No cul de sac fullness. Rectal:  Good tone no rectovaginal septum nodularity no pelvic masses  Back: No CVA tenderness  LN:  No cervical supraclavicular or inguinal adenopathy  Extremities:  No cyanosis, clubbing or edema.  No calf tenderness or erythema Musculoskeletal: No pain, normal strength and range of motion.  Assessment:    62 y.o. year old with Stage1AGrade 2 endometrioid endometrial cancer.   No LVSI, 10% myometrial invasion, negative  pelvic washings and negative  lymph nodes. The patient presents with a report of vaginal bleeding on 2 occasions.   Plan: Vaginal apex biopsied and silver nitrate applied to the area.  This is consistent with granulation tissue . Will contact patient with results.   Return to clinic in 3 months  Followup with Dr. Leo Grosser in 6 months for physical examination and Pap test.

## 2012-02-22 ENCOUNTER — Other Ambulatory Visit: Payer: Self-pay | Admitting: *Deleted

## 2012-02-22 DIAGNOSIS — C9000 Multiple myeloma not having achieved remission: Secondary | ICD-10-CM

## 2012-02-22 MED ORDER — LENALIDOMIDE 15 MG PO CAPS: 15.0000 mg | ORAL_CAPSULE | Freq: Every day | ORAL | Status: DC

## 2012-02-22 NOTE — Telephone Encounter (Signed)
Received a refill request from [facility] for Revlimid. She is taking this 21 days on 7 days off. Obtained auth # [id number]. Will have Dr Marin Olp sign the rx and fax it back to [phone].

## 2012-02-26 ENCOUNTER — Telehealth: Payer: Self-pay | Admitting: Gynecologic Oncology

## 2012-02-26 NOTE — Telephone Encounter (Signed)
Pt informed of biopsy results.  Reporting light pink spotting with straining.  Instructed to monitor and inform the office if the spotting persisted or worsened after a few days.  Instructed to contact the office for any questions or concerns.

## 2012-02-28 ENCOUNTER — Other Ambulatory Visit: Payer: Self-pay | Admitting: *Deleted

## 2012-02-28 NOTE — Telephone Encounter (Signed)
error 

## 2012-03-19 ENCOUNTER — Other Ambulatory Visit: Payer: Self-pay | Admitting: *Deleted

## 2012-03-19 DIAGNOSIS — C9000 Multiple myeloma not having achieved remission: Secondary | ICD-10-CM

## 2012-03-19 MED ORDER — LENALIDOMIDE 15 MG PO CAPS: 15.0000 mg | ORAL_CAPSULE | Freq: Every day | ORAL | Status: DC

## 2012-03-19 NOTE — Telephone Encounter (Signed)
Received a refill request from [place] for Revlimid. She is taking this 21 days on 7 days off. Obtained auth # [id number]. Will have Dr Marin Olp sign the rx and fax it back to [phone].

## 2012-04-19 ENCOUNTER — Other Ambulatory Visit: Payer: Self-pay | Admitting: *Deleted

## 2012-04-19 DIAGNOSIS — C9000 Multiple myeloma not having achieved remission: Secondary | ICD-10-CM

## 2012-04-19 MED ORDER — LENALIDOMIDE 15 MG PO CAPS: 15.0000 mg | ORAL_CAPSULE | Freq: Every day | ORAL | Status: DC

## 2012-04-19 NOTE — Telephone Encounter (Signed)
Received a refill request from [facility] for Revlimid. She is taking this 21 days on 7 days off. Obtained auth # [id number]. Helayne [place]in[place], PA signed the rx. Faxed it back to [phone].

## 2012-04-29 ENCOUNTER — Other Ambulatory Visit (HOSPITAL_BASED_OUTPATIENT_CLINIC_OR_DEPARTMENT_OTHER): Payer: BC Managed Care – PPO | Admitting: Lab

## 2012-04-29 DIAGNOSIS — D649 Anemia, unspecified: Secondary | ICD-10-CM

## 2012-04-29 DIAGNOSIS — C9000 Multiple myeloma not having achieved remission: Secondary | ICD-10-CM

## 2012-04-29 LAB — IRON AND TIBC
%SAT: 23 % (ref 20–55)
Iron: 95 ug/dL (ref 42–145)
TIBC: 411 ug/dL (ref 250–470)
UIBC: 316 ug/dL (ref 125–400)

## 2012-04-29 LAB — CBC WITH DIFFERENTIAL (CANCER CENTER ONLY)
BASO#: 0.1 10*3/uL (ref 0.0–0.2)
BASO%: 2.4 % — ABNORMAL HIGH (ref 0.0–2.0)
EOS%: 3 % (ref 0.0–7.0)
Eosinophils Absolute: 0.1 10*3/uL (ref 0.0–0.5)
HCT: 31.3 % — ABNORMAL LOW (ref 34.8–46.6)
HGB: 10.4 g/dL — ABNORMAL LOW (ref 11.6–15.9)
LYMPH#: 1.2 10*3/uL (ref 0.9–3.3)
LYMPH%: 37.7 % (ref 14.0–48.0)
MCH: 26.3 pg (ref 26.0–34.0)
MCHC: 33.2 g/dL (ref 32.0–36.0)
MCV: 79 fL — ABNORMAL LOW (ref 81–101)
MONO#: 0.4 10*3/uL (ref 0.1–0.9)
MONO%: 13.1 % — ABNORMAL HIGH (ref 0.0–13.0)
NEUT#: 1.4 10*3/uL — ABNORMAL LOW (ref 1.5–6.5)
NEUT%: 43.8 % (ref 39.6–80.0)
Platelets: 255 10*3/uL (ref 145–400)
RBC: 3.95 10*6/uL (ref 3.70–5.32)
RDW: 18.1 % — ABNORMAL HIGH (ref 11.1–15.7)
WBC: 3.3 10*3/uL — ABNORMAL LOW (ref 3.9–10.0)

## 2012-04-29 LAB — COMPREHENSIVE METABOLIC PANEL
ALT: 17 U/L (ref 0–35)
AST: 15 U/L (ref 0–37)
Albumin: 4.1 g/dL (ref 3.5–5.2)
Alkaline Phosphatase: 41 U/L (ref 39–117)
BUN: 19 mg/dL (ref 6–23)
CO2: 28 mEq/L (ref 19–32)
Calcium: 9.7 mg/dL (ref 8.4–10.5)
Chloride: 105 mEq/L (ref 96–112)
Creatinine, Ser: 1.02 mg/dL (ref 0.50–1.10)
Glucose, Bld: 88 mg/dL (ref 70–99)
Potassium: 3.6 mEq/L (ref 3.5–5.3)
Sodium: 140 mEq/L (ref 135–145)
Total Bilirubin: 0.4 mg/dL (ref 0.3–1.2)
Total Protein: 6.4 g/dL (ref 6.0–8.3)

## 2012-04-29 LAB — FERRITIN: Ferritin: 81 ng/mL (ref 10–291)

## 2012-05-01 ENCOUNTER — Other Ambulatory Visit: Payer: BC Managed Care – PPO | Admitting: Lab

## 2012-05-01 LAB — UIFE/LIGHT CHAINS/TP QN, 24-HR UR
Albumin, U: DETECTED
Alpha 1, Urine: DETECTED — AB
Alpha 2, Urine: DETECTED — AB
Beta, Urine: DETECTED — AB
Free Kappa Lt Chains,Ur: 3.78 mg/dL — ABNORMAL HIGH (ref 0.14–2.42)
Free Kappa/Lambda Ratio: 0.1 ratio — ABNORMAL LOW (ref 2.04–10.37)
Free Lambda Excretion/Day: 341.1 mg/d
Free Lambda Lt Chains,Ur: 37.9 mg/dL — ABNORMAL HIGH (ref 0.02–0.67)
Free Lt Chn Excr Rate: 34.02 mg/d
Gamma Globulin, Urine: DETECTED — AB
Time: 24 hours
Total Protein, Urine-Ur/day: 383 mg/d — ABNORMAL HIGH (ref 10–140)
Total Protein, Urine: 42.5 mg/dL
Volume, Urine: 900 mL

## 2012-05-15 ENCOUNTER — Other Ambulatory Visit: Payer: Self-pay | Admitting: *Deleted

## 2012-05-15 ENCOUNTER — Ambulatory Visit (HOSPITAL_BASED_OUTPATIENT_CLINIC_OR_DEPARTMENT_OTHER): Payer: BC Managed Care – PPO | Admitting: Hematology & Oncology

## 2012-05-15 ENCOUNTER — Ambulatory Visit (HOSPITAL_BASED_OUTPATIENT_CLINIC_OR_DEPARTMENT_OTHER): Payer: BC Managed Care – PPO

## 2012-05-15 VITALS — BP 128/71 | HR 80 | Temp 97.9°F | Resp 16 | Ht 63.0 in | Wt 186.0 lb

## 2012-05-15 DIAGNOSIS — C9000 Multiple myeloma not having achieved remission: Secondary | ICD-10-CM

## 2012-05-15 DIAGNOSIS — Z7982 Long term (current) use of aspirin: Secondary | ICD-10-CM

## 2012-05-15 DIAGNOSIS — Z8542 Personal history of malignant neoplasm of other parts of uterus: Secondary | ICD-10-CM

## 2012-05-15 DIAGNOSIS — Z8579 Personal history of other malignant neoplasms of lymphoid, hematopoietic and related tissues: Secondary | ICD-10-CM

## 2012-05-15 MED ORDER — SODIUM CHLORIDE 0.9 % IV SOLN
Freq: Once | INTRAVENOUS | Status: AC
  Administered 2012-05-15: 11:00:00 via INTRAVENOUS

## 2012-05-15 MED ORDER — LENALIDOMIDE 15 MG PO CAPS: 15.0000 mg | ORAL_CAPSULE | Freq: Every day | ORAL | Status: DC

## 2012-05-15 MED ORDER — SODIUM CHLORIDE 0.9 % IV SOLN
90.0000 mg | Freq: Once | INTRAVENOUS | Status: AC
  Administered 2012-05-15: 90 mg via INTRAVENOUS
  Filled 2012-05-15: qty 10

## 2012-05-15 NOTE — Progress Notes (Signed)
This office note has been dictated.

## 2012-05-15 NOTE — Patient Instructions (Addendum)
Pamidronate injection  What is this medicine?  PAMIDRONATE (pa mi DROE nate) slows calcium loss from bones. It is used to treat high calcium blood levels from cancer or Paget's disease. It is also used to treat bone pain and prevent fractures from certain cancers that have spread to the bone. This medicine may be used for other purposes; ask your health care provider or pharmacist if you have questions.  What should I tell my health care provider before I take this medicine?  They need to know if you have any of these conditions: -aspirin-sensitive asthma -dental disease -kidney disease -an unusual or allergic reaction to pamidronate, other medicines, foods, dyes, or preservatives -pregnant or trying to get pregnant -breast-feeding  How should I use this medicine?  This medicine is for infusion into a vein. It is given by a health care professional in a hospital or clinic setting. Talk to your pediatrician regarding the use of this medicine in children. This medicine is not approved for use in children. Overdosage: If you think you have taken too much of this medicine contact a poison control center or emergency room at once. NOTE: This medicine is only for you. Do not share this medicine with others.  What if I miss a dose?  This does not apply.  What may interact with this medicine?  -certain antibiotics given by injection -medicines for inflammation or pain like ibuprofen, naproxen -some diuretics like bumetanide, furosemide -cyclosporine -parathyroid hormone -tacrolimus -teriparatide -thalidomide This list may not describe all possible interactions. Give your health care provider a list of all the medicines, herbs, non-prescription drugs, or dietary supplements you use. Also tell them if you smoke, drink alcohol, or use illegal drugs. Some items may interact with your medicine.  What should I watch for while using this medicine?  Visit your doctor or health care  professional for regular checkups. It may be some time before you see the benefit from this medicine. Do not stop taking your medicine unless your doctor tells you to. Your doctor may order blood tests or other tests to see how you are doing. Women should inform their doctor if they wish to become pregnant or think they might be pregnant. There is a potential for serious side effects to an unborn child. Talk to your health care professional or pharmacist for more information. You should make sure that you get enough calcium and vitamin D while you are taking this medicine. Discuss the foods you eat and the vitamins you take with your health care professional. Some people who take this medicine have severe bone, joint, and/or muscle pain. This medicine may also increase your risk for a broken thigh bone. Tell your doctor right away if you have pain in your upper leg or groin. Tell your doctor if you have any pain that does not go away or that gets worse.  What side effects may I notice from receiving this medicine?  Side effects that you should report to your doctor or health care professional as soon as possible: -allergic reactions like skin rash, itching or hives, swelling of the face, lips, or tongue -black or tarry stools -changes in vision -eye inflammation, pain -high blood pressure -jaw pain, especially burning or cramping -muscle weakness -numb, tingling pain -swelling of feet or hands -trouble passing urine or change in the amount of urine -unable to move easily Side effects that usually do not require medical attention (report to your doctor or health care professional if they continue or  are bothersome): -bone, joint, or muscle pain -constipation -dizzy, drowsy -fever -headache -loss of appetite -nausea, vomiting -pain at site where injected This list may not describe all possible side effects. Call your doctor for medical advice about side effects. You may report side effects  to FDA at 1-800-FDA-1088.  Where should I keep my medicine?  This drug is given in a hospital or clinic and will not be stored at home. NOTE: This sheet is a summary. It may not cover all possible information. If you have questions about this medicine, talk to your doctor, pharmacist, or health care provider.  2012, Elsevier/Gold Standard. (07/15/2010 8:49:49 AM)  Potassium Content of Foods  Potassium is a mineral. The body needs potassium to control blood pressure and to keep the muscles and nervous system healthy. Most foods contain potassium. Eating a variety of foods in the right amounts will help control the level of potassium in your body.  Kidney problems can cause there to be too much potassium in the body. If this happens, you may need to lower the amount of potassium in your diet. Some medicines, such as diuretics, may cause your body to lose too much potassium. If this happens, you may need to increase the amount of potassium in your diet.  COMMON SERVING SIZES The list below tells you how big or small some common portion sizes are:  1 oz.........4 stacked dice.  3 oz........[name] KitchenDeck of cards.  1 tsp.......[name] KitchenTip of little finger.  1 tbs......[name] KitchenMarland KitchenThumb.  2 tbs.......[name] KitchenGolf ball.   cup......[name] KitchenHalf of a fist.  1 cup.......[name] KitchenA fist.   FOODS AND DRINKS HIGH IN POTASSIUM More than 250 mg per serving.  Bran cereals and other bran products.  Milk (skim, 1%, 2%, whole).  Buttermilk.  Yogurt.  Avocados.  Bananas.  Dried fruits.  Kiwis.  Oranges.  Prunes.  Raisins.  Baked beans.  Spinach.  Tomatoes.  Peanut butter.  Nuts.  Tofu.  Potatoes.   FOODS MODERATE IN POTASSIUM Between 150 to 250 mg per serving.  Cherries.  Mangoes.  Asparagus.  Peas.  Zucchini.  Celery.  Cantaloupes.  Fresh peaches.  Broccoli stalks.  Peppers.  Figs.  Fresh pears.  Kale.  Summer squashes.   FOODS LOW IN POTASSIUM Less than 150 mg per  serving.  Pasta.  Rice.  Cottage cheese.  Cheddar cheese.  Apples.  Grapes.  Pineapple.  Raspberries.  Strawberries.  Watermelon.  Green beans.  Cabbage.  Cauliflower.  Corn.  Mushrooms.  Onions.  Eggs. Document Released: 08/30/2004 Document Revised: 04/10/2011 Document Reviewed: 06/02/2011 [facility] Patient Information 2013 [place].

## 2012-05-16 NOTE — Progress Notes (Signed)
CC:   Eldred Manges, M.D. Jeanann Lewandowsky, M.D. Jeanann Lewandowsky, MD  DIAGNOSES: 1. Recurrent lambda light chain myeloma. 2. History of stage I uterine adenocarcinoma.  CURRENT THERAPY: 1. Revlimid 15 mg p.o. daily (21 days on/7 days off). 2. Aredia 90 mg IV q.4 months.  INTERIM HISTORY:  [name] comes in for followup.  We last saw her back in December.  She has been doing fairly well.  She did have uterine cancer back in, I think, October.  This was stage I.  She did not require any kind of treatment afterwards.  Her lambda light chain that we checked back then was down to 101 mg per day.  She has had no urinary issues.  She has had some diarrhea with the Revlimid, but this seems to be fairly well controlled.  She has had no cough.  She had no shortness of breath.  She has had no leg swelling.  There has been no rashes.  Her birthday is this weekend, and her family is coming home for this.  PHYSICAL EXAMINATION:  General:  This is a well-developed, well- nourished African American female in no obvious distress.  Vital signs: Temperature of 97.9, pulse 80, respiratory rate 16, blood pressure 128/71.  Weight is 188.  Head and neck:  Normocephalic, atraumatic skull.  There are no ocular or oral lesions.  There are no palpable cervical or supraclavicular lymph nodes.  Lungs:  Clear bilaterally. Cardiac:  Regular rate and rhythm, with a normal S1 and S2.  There are no murmurs, rubs or bruits.  Abdomen:  Soft with good bowel sounds. There is no palpable abdominal mass.  There is no palpable hepatosplenomegaly.  Extremities:  Show no clubbing, cyanosis or edema. Neurological:  Shows no focal neurological deficits.  LABORATORY STUDIES:  White cell count is 3.3, hemoglobin 10.4, hematocrit 31.3, platelet count 255,000.  BUN is 19, creatinine 1.02.  Total protein 6.4.  Calcium is 9.7.  A 24-hour urine shows a lambda light chain to be 341 mg per day.  IMPRESSION:  Ms.  Emily Greer is a 62 year old African American female with recurrent lambda light chain myeloma.  We have her on Revlimid.  I am a little surprised that her levels went up.  I am not sure exactly what this means at this point in time.  I still want to keep her on the Revlimid and recheck her light chains in about 3 months.  If we find that her light chains are further elevated in 3 months, then I think we will have to make a change and consider her for Velcade in addition to the Revlimid.  She is taking one full aspirin a day.  We will plan to get her back in 3 months' time.    ______________________________ [name], M.D. PRE/MEDQ  D:  05/15/2012  T:  05/16/2012  Job:  [id number]

## 2012-05-20 ENCOUNTER — Other Ambulatory Visit: Payer: Self-pay | Admitting: *Deleted

## 2012-05-20 DIAGNOSIS — C9000 Multiple myeloma not having achieved remission: Secondary | ICD-10-CM

## 2012-05-20 MED ORDER — LENALIDOMIDE 15 MG PO CAPS: 15.0000 mg | ORAL_CAPSULE | Freq: Every day | ORAL | Status: DC

## 2012-05-27 ENCOUNTER — Telehealth: Payer: Self-pay | Admitting: Gynecologic Oncology

## 2012-05-27 NOTE — Telephone Encounter (Signed)
Office Visit:  GYN ONCOLOGY   CC:   Endometrial cancer surveillance   Assessment:   62 y.o.  year old with Stage1A Grade 2 endometrioid endometrial cancer.   No LVSI, 10% myometrial invasion, negative  pelvic washings and negative  lymph nodes. Return to clinic in 12 months  Followup with Dr. Leo Grosser in 6 months for physical examination and Pap test.      HPI:  Emily Greer is a 62 y.o. year old G3P2 initially seen in consultation on 10/04/2001  grade 1  endometrial cancer  She then underwent a  total abdominal hysterectomy bilateral salpingo-oophorectomy bilateral pelvic lymph node dissection on  [id number] without complications.  Her postoperative course was  uncomplicated.  Her final pathologic diagnosis is a Stage  1A Grade  2 endometrioid endometrial cancer with  negative lymphovascular space invasion,  2/20 (10%) of myometrial invasion and negative lymph nodes.  On 1/14 visit she reported  post coital vaginal bleeding  that was self limiting. Vaginal biopsy - LARGELY DENUDED SQUAMOUS EPITHELIUM WITH ASSOCIATED SPONGIOSIS AND CHRONIC INFLAMMATION.- NO DYSPLASIA OR MALIGNANCY   At this visit .She denies any nausea vomiting fever chills she denies any hematuria or hematochezia. She denies any abdominal bloating or changes in appetite   Past Medical History  Diagnosis Date  . Lambda light chain myeloma 11/11/2007  . Hypertension 11/28/97  . H/O multiple myeloma 11/28/1997  . Staphylococcus aureus bacteremia 11/28/1997  . Staphylococcus epidermidis bacteremia 11/28/97  . Pregnancy induced hypertension   . Osteoporosis 12/18/05    Increased  risk   . Increased BMI 12/18/05  . Post-menopausal bleeding 12/19/06  . Vaginal atrophy 12/19/06  . Elevated hemoglobin A1c     Borderline  . Fibroid 07/26/10    asymptomatic  . Vaginal atrophy   . PONV (postoperative nausea and vomiting)     nausea in past, none recent   Colonoscopy scheduled for 02/29/2012 Mammogram 2013 wnl per  patient  Past Surgical History  Procedure Laterality Date  . Tubal ligation  1986  . Ectopic pregnancy surgery  1992  . Hysteroscopy w/d&c  09/27/2011    Procedure: DILATATION AND CURETTAGE /HYSTEROSCOPY;  Surgeon: Eldred Manges, MD;  Location: [facility];  Service: Gynecology;;  . Laparotomy  10/31/2011    Procedure: EXPLORATORY LAPAROTOMY;  Surgeon: [name], MD PHD;  Location: [facility];  Service: Gynecology;  Laterality: N/A;  . Abdominal hysterectomy  10/31/2011    Procedure: HYSTERECTOMY ABDOMINAL;  Surgeon: [name], MD PHD;  Location: [facility];  Service: Gynecology;  Laterality: N/A;  . Salpingoophorectomy  10/31/2011    Procedure: SALPINGO OOPHERECTOMY;  Surgeon: [name], MD PHD;  Location: [facility];  Service: Gynecology;  Laterality: Bilateral;   Lymph Nodes sampling   Social History:  Accompanied by her husband.  Stable work envirnment  Review of systems: Constitutional:  She has no fever or chills. Cardiovascular: No chest pain, palpitations or edema. Respiratory:  No shortness of breath, wheezing or cough Gastrointestinal: She has normal bowel movements without diarrhea or constipation. She denies any nausea or vomiting. Genitourinary:  She denies pelvic pain, pelvic pressure or changes in her urinary function. She has post quite so vaginal bleeding on 2 occasions she denies vaginal discharge Musculoskeletal:  No changes in gait or joint pain Otherwise uninformative 10 point review of system    Physical Exam: Blood pressure 138/80, pulse 80, temperature 97.9 F (36.6 C), resp. rate 16, height 5' 3.23" (1.606 m), weight 185 lb 12.8 oz (84.278  kg). General: Well dressed, well nourished in no apparent distress.   Lungs:  Clear to auscultation bilaterally.  No wheezes. Cardiovascular:  Regular rate and rhythm.  No murmurs or rubs. Abdomen:  Soft, nontender, nondistended.  No palpable masses.  No hepatosplenomegaly.  No ascites. Normal bowel sounds.  No hernias.   Incision is clean dry and intact without any evidence of erythema or hernia   Genitourinary: Normal EGBUS  Vaginal cuff intact. Erythema noted at the right vaginal. The area was biopsied.  No bleeding or discharge.  No cul de sac fullness. Rectal:  Good tone no rectovaginal septum nodularity no pelvic masses  Back: No CVA tenderness  LN:  No cervical supraclavicular or inguinal adenopathy  Extremities: No cyanosis, clubbing or edema.  No calf tenderness or erythema Musculoskeletal: No pain, normal strength and range of motion.  [name]  MD., PhD

## 2012-05-28 ENCOUNTER — Encounter: Payer: Self-pay | Admitting: Gynecologic Oncology

## 2012-05-28 ENCOUNTER — Ambulatory Visit: Payer: BC Managed Care – PPO | Attending: Gynecologic Oncology | Admitting: Gynecologic Oncology

## 2012-05-28 VITALS — BP 128/80 | HR 68 | Temp 97.8°F | Resp 16 | Ht 63.23 in | Wt 188.2 lb

## 2012-05-28 DIAGNOSIS — C541 Malignant neoplasm of endometrium: Secondary | ICD-10-CM

## 2012-05-28 DIAGNOSIS — C549 Malignant neoplasm of corpus uteri, unspecified: Secondary | ICD-10-CM | POA: Insufficient documentation

## 2012-05-28 DIAGNOSIS — I10 Essential (primary) hypertension: Secondary | ICD-10-CM | POA: Insufficient documentation

## 2012-05-28 DIAGNOSIS — Z87898 Personal history of other specified conditions: Secondary | ICD-10-CM | POA: Insufficient documentation

## 2012-05-28 DIAGNOSIS — M810 Age-related osteoporosis without current pathological fracture: Secondary | ICD-10-CM | POA: Insufficient documentation

## 2012-05-28 DIAGNOSIS — Z90710 Acquired absence of both cervix and uterus: Secondary | ICD-10-CM | POA: Insufficient documentation

## 2012-05-28 DIAGNOSIS — N939 Abnormal uterine and vaginal bleeding, unspecified: Secondary | ICD-10-CM

## 2012-05-28 HISTORY — DX: Malignant neoplasm of endometrium: C54.1

## 2012-05-28 NOTE — Patient Instructions (Signed)
  Patient's been advised to followup in 3 months.  Will arrange for followup with Dr. Leo Grosser after the vaginal bleeding is optimally managed    Thank you very much [name] for allowing me to provide care for you today.  I appreciate your confidence in choosing our Gynecologic Oncology team.  If you have any questions about your visit today please call our office and we will get back to you as soon as possible.  Francetta Found. Kamayah Pillay MD., PhD Gynecologic Oncology

## 2012-05-28 NOTE — Progress Notes (Signed)
Office Visit:  GYN ONCOLOGY   CC:   Endometrial cancer surveillance   Assessment:   62 y.o.  year old with Stage1A Grade 2 endometrioid endometrial cancer.   No LVSI, 10% myometrial invasion, negative  pelvic washings and negative  lymph nodes. Return to clinic in 3 months.  If the biopsies collected today are consistent with granulation tissue months and evidence that this is present in 3 months and asymptomatic I will consider electrocautery for permanent removal.   Patient's been advised to followup in 3 months.  Will arrange for followup with Dr. Leo Grosser after the vaginal bleeding is optimally managed    HPI:  Emily Greer is a 62 y.o. year old G3P2 initially seen in consultation on 10/04/2001  grade 1  endometrial cancer  She then underwent a  total abdominal hysterectomy bilateral salpingo-oophorectomy bilateral pelvic lymph node dissection on  [id number] without complications.  Her postoperative course was  uncomplicated.  Her final pathologic diagnosis is a Stage  1A Grade  2 endometrioid endometrial cancer with  negative lymphovascular space invasion,  2/20 (10%) of myometrial invasion and negative lymph nodes.  On 1/14 visit she reported  post coital vaginal bleeding  that was self limiting. Vaginal biopsy - LARGELY DENUDED SQUAMOUS EPITHELIUM WITH ASSOCIATED SPONGIOSIS AND CHRONIC INFLAMMATION.- NO DYSPLASIA OR MALIGNANCY   At this visit .She denies any nausea vomiting fever chills she denies any hematuria or hematochezia. She denies any abdominal bloating or changes in appetite.  She reports vaginal bleeding that he can 2-3 weeks ago and occurs intermittently. She states that prior to 2 weeks ago which is did not precipitate any bleeding. She reports weight gain and poor appetite no abdominal bloating. Her new issue for which is under evaluation is that of frequent bowel movements.   Past Medical History  Diagnosis Date  . Lambda light chain myeloma 11/11/2007  .  Hypertension 11/28/97  . H/O multiple myeloma 11/28/1997  . Staphylococcus aureus bacteremia 11/28/1997  . Staphylococcus epidermidis bacteremia 11/28/97  . Pregnancy induced hypertension   . Osteoporosis 12/18/05    Increased  risk   . Increased BMI 12/18/05  . Post-menopausal bleeding 12/19/06  . Vaginal atrophy 12/19/06  . Elevated hemoglobin A1c     Borderline  . Fibroid 07/26/10    asymptomatic  . Vaginal atrophy   . PONV (postoperative nausea and vomiting)     nausea in past, none recent   Colonoscopy scheduled for 02/29/2012 Mammogram 2013 wnl per patient  Past Surgical History  Procedure Laterality Date  . Tubal ligation  1986  . Ectopic pregnancy surgery  1992  . Hysteroscopy w/d&c  09/27/2011    Procedure: DILATATION AND CURETTAGE /HYSTEROSCOPY;  Surgeon: Eldred Manges, MD;  Location: [facility];  Service: Gynecology;;  . Laparotomy  10/31/2011    Procedure: EXPLORATORY LAPAROTOMY;  Surgeon: [name], MD PHD;  Location: [facility];  Service: Gynecology;  Laterality: N/A;  . Abdominal hysterectomy  10/31/2011    Procedure: HYSTERECTOMY ABDOMINAL;  Surgeon: [name], MD PHD;  Location: [facility];  Service: Gynecology;  Laterality: N/A;  . Salpingoophorectomy  10/31/2011    Procedure: SALPINGO OOPHERECTOMY;  Surgeon: [name], MD PHD;  Location: [facility];  Service: Gynecology;  Laterality: Bilateral;   Lymph Nodes sampling   Social History:  Accompanied by her husband.  Stable work envirnment  Review of systems: Constitutional:  She has no fever or chills. Cardiovascular: No chest pain, palpitations or edema. Respiratory:  No shortness of breath,  wheezing or cough Gastrointestinal: She has normal bowel movements without diarrhea or constipation. She denies any nausea or vomiting. Genitourinary:  She denies pelvic pain, pelvic pressure or changes in her urinary function. She has post quite so vaginal bleeding on 2 occasions she denies vaginal discharge reports  vaginal bleeding 2 weeks ago, with intermittent spotting since. Musculoskeletal:  No changes in gait or joint pain Otherwise uninformative 10 point review of system    Physical Exam: Blood pressure 128/80, pulse 68, temperature 97.8 F (36.6 C), resp. rate 16, height 5' 3.23" (1.606 m), weight 188 lb 3.2 oz (85.367 kg). General: Well dressed, well nourished in no apparent distress.   Lungs:  Clear to auscultation bilaterally.  No wheezes. Cardiovascular:  Regular rate and rhythm.  No murmurs or rubs. Abdomen:  Soft, nontender, nondistended.  No palpable masses.  No hepatosplenomegaly.  No ascites. Normal bowel sounds.  No hernias.  Incision is clean dry and intact without any evidence of erythema or hernia   Genitourinary: Normal EGBUS  Vaginal cuff intact. Friable graulation tissue at the vaginal apex.  Removed and sent to pathology.  56mm lesion on left distal vaginal sidewall 2cm from introitus.  Removed    No cul de sac fullness. Rectal:  Good tone no rectovaginal septum nodularity no pelvic masses  Back: No CVA tenderness  LN:  No cervical supraclavicular or inguinal adenopathy  Extremities: No cyanosis, clubbing or edema.  No calf tenderness or erythema Musculoskeletal: No pain, normal strength and range of motion.  [name]  MD., PhD

## 2012-05-31 ENCOUNTER — Telehealth: Payer: Self-pay | Admitting: Gynecologic Oncology

## 2012-05-31 NOTE — Telephone Encounter (Signed)
Attempted to return call to patient.  Message left.

## 2012-06-03 ENCOUNTER — Other Ambulatory Visit: Payer: Self-pay | Admitting: *Deleted

## 2012-06-03 ENCOUNTER — Telehealth: Payer: Self-pay | Admitting: Gynecologic Oncology

## 2012-06-03 DIAGNOSIS — C541 Malignant neoplasm of endometrium: Secondary | ICD-10-CM

## 2012-06-03 NOTE — Telephone Encounter (Signed)
Spoke with patient this am about arranging CT of the chest, abdomen, and pelvis along with PET scan to evaluate for distant metastasis.  Returned call with patient with appointment time and message left stating that she is to be NPO after midnight for her PET and CT scans on Wednesday am, Jun 05, 2012 at 06:45am.  Instructed to call the office for any questions or concerns.  Informed by the radiology scheduler that she would not need oral contrast since she would be given contrast with the PET.  Last labs 04/29/12.

## 2012-06-05 ENCOUNTER — Encounter (HOSPITAL_COMMUNITY)
Admission: RE | Admit: 2012-06-05 | Discharge: 2012-06-05 | Disposition: A | Discharge status: Home / Self Care | Payer: BC Managed Care – PPO | Source: Ambulatory Visit | Attending: Gynecologic Oncology | Admitting: Gynecologic Oncology

## 2012-06-05 ENCOUNTER — Encounter (HOSPITAL_COMMUNITY): Payer: Self-pay

## 2012-06-05 ENCOUNTER — Encounter: Payer: Self-pay | Admitting: *Deleted

## 2012-06-05 ENCOUNTER — Telehealth: Payer: Self-pay | Admitting: Gynecologic Oncology

## 2012-06-05 ENCOUNTER — Ambulatory Visit (HOSPITAL_COMMUNITY)
Admission: RE | Admit: 2012-06-05 | Discharge: 2012-06-05 | Disposition: A | Discharge status: Home / Self Care | Payer: BC Managed Care – PPO | Source: Ambulatory Visit | Attending: Gynecologic Oncology | Admitting: Gynecologic Oncology

## 2012-06-05 DIAGNOSIS — Z90710 Acquired absence of both cervix and uterus: Secondary | ICD-10-CM | POA: Insufficient documentation

## 2012-06-05 DIAGNOSIS — C549 Malignant neoplasm of corpus uteri, unspecified: Secondary | ICD-10-CM | POA: Insufficient documentation

## 2012-06-05 DIAGNOSIS — C541 Malignant neoplasm of endometrium: Secondary | ICD-10-CM

## 2012-06-05 DIAGNOSIS — M899 Disorder of bone, unspecified: Secondary | ICD-10-CM | POA: Insufficient documentation

## 2012-06-05 DIAGNOSIS — C9000 Multiple myeloma not having achieved remission: Secondary | ICD-10-CM | POA: Insufficient documentation

## 2012-06-05 DIAGNOSIS — M949 Disorder of cartilage, unspecified: Secondary | ICD-10-CM | POA: Insufficient documentation

## 2012-06-05 DIAGNOSIS — N281 Cyst of kidney, acquired: Secondary | ICD-10-CM | POA: Insufficient documentation

## 2012-06-05 DIAGNOSIS — N950 Postmenopausal bleeding: Secondary | ICD-10-CM | POA: Insufficient documentation

## 2012-06-05 HISTORY — DX: Malignant neoplasm of endometrium: C54.1

## 2012-06-05 LAB — GLUCOSE, CAPILLARY: Glucose-Capillary: 100 mg/dL — ABNORMAL HIGH (ref 70–99)

## 2012-06-05 MED ORDER — IOHEXOL 300 MG/ML  SOLN
100.0000 mL | Freq: Once | INTRAMUSCULAR | Status: AC | PRN
  Administered 2012-06-05: 100 mL via INTRAVENOUS

## 2012-06-05 MED ORDER — FLUDEOXYGLUCOSE F - 18 (FDG) INJECTION
14.5000 | Freq: Once | INTRAVENOUS | Status: AC | PRN
  Administered 2012-06-05: 14.5 via INTRAVENOUS

## 2012-06-05 NOTE — Progress Notes (Signed)
GYN Location of Tumor / Histology: uterus  Patient presented  4 months ago with symptoms of: vaginal bleeding  Biopsies of vaginal apex, left distal sidewall (if applicable) revealed: endometrioid adenocarcinoma, 05/28/12  Past/Anticipated interventions by Gyn/Onc surgery, if any: TAH/BSO 10/31/11 Past/Anticipated interventions by medical oncology, if any: none noted  Weight changes, if any: no  Bowel/Bladder complaints, if any: freq bm's  Nausea/Vomiting, if any: no  Pain issues, if any:  none  SAFETY ISSUES:  Prior radiation? no  Pacemaker/ICD? no  Possible current pregnancy? No   Is the patient on methotrexate? 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  Pt denies pain, urinary issues, fatigue, loss of appetite. She has loose bm's she attributes to Revlimid, vaginal bleeding.

## 2012-06-05 NOTE — Telephone Encounter (Signed)
Patient informed of CT and PET scan results.  Instructed that an appt with Dr. Sondra Come in Radiation Oncology would be arranged per Dr. Leone Brand recommendations.  Patient verbalizing understanding.  2nd call to office: Patient informed of upcoming appt with Dr. Sondra Come tomorrow, May 8.  Arrival at 2:30pm for a 3 o' clock appt.  Patient verbalizing understanding.  Instructed to call for any needs or concerns.

## 2012-06-06 ENCOUNTER — Ambulatory Visit
Admission: RE | Admit: 2012-06-06 | Discharge: 2012-06-06 | Disposition: A | Discharge status: Home / Self Care | Payer: BC Managed Care – PPO | Source: Ambulatory Visit | Attending: Radiation Oncology | Admitting: Radiation Oncology

## 2012-06-06 ENCOUNTER — Encounter: Payer: Self-pay | Admitting: Radiation Oncology

## 2012-06-06 VITALS — BP 137/67 | HR 68 | Temp 98.6°F | Resp 20 | Wt 188.0 lb

## 2012-06-06 DIAGNOSIS — C549 Malignant neoplasm of corpus uteri, unspecified: Secondary | ICD-10-CM | POA: Insufficient documentation

## 2012-06-06 DIAGNOSIS — Z90710 Acquired absence of both cervix and uterus: Secondary | ICD-10-CM | POA: Insufficient documentation

## 2012-06-06 DIAGNOSIS — C541 Malignant neoplasm of endometrium: Secondary | ICD-10-CM

## 2012-06-06 DIAGNOSIS — Z79899 Other long term (current) drug therapy: Secondary | ICD-10-CM | POA: Insufficient documentation

## 2012-06-06 DIAGNOSIS — Z9079 Acquired absence of other genital organ(s): Secondary | ICD-10-CM | POA: Insufficient documentation

## 2012-06-06 DIAGNOSIS — Z87898 Personal history of other specified conditions: Secondary | ICD-10-CM | POA: Insufficient documentation

## 2012-06-06 DIAGNOSIS — I10 Essential (primary) hypertension: Secondary | ICD-10-CM | POA: Insufficient documentation

## 2012-06-06 NOTE — Progress Notes (Addendum)
Radiation Oncology         [phone] ________________________________  Initial outpatient Consultation  Name: Emily Greer MRN: [id number]  Date: 06/06/2012  DOB: 1950-09-16  [id number] S, MD  [name], MD   REFERRING PHYSICIAN: [name], MD  DIAGNOSIS: Recurrent endometrial cancer  HISTORY OF PRESENT ILLNESS::Emily Greer is a 62 y.o. female who is see out of the courtesy of Dr. [name] for an opinion concerning radiation therapy as part of management of patient's recently diagnosed recurrent endometrioid adenocarcinoma. Last year patient presented with vaginal bleeding. She was found to have endometrial cancer, grade 1. Patient underwent a total hysterectomy bilateral salpingo-oophorectomy bilateral pelvic node dissection on 10/31/2011. Upon pathologic review the patient was found to have stage IA grade 2 endometrioid cancer. There was no lymphovascular space invasion and only 10% myometrial invasion. Early stage no adjuvant therapy was recommended for the patient. She did well until earlier this year when she developed some mild post coital vaginal  bleeding. Patient was seen by Dr. Skeet Latch and was noted to have some granulation tissue. A biopsy was made of the vaginal area in January with no evidence of malignancy. The patient returned in April of this year and proceeded to undergo a biopsy of the anterior vagina and left distal sidewall vagina. Pathology from these biopsies revealed endometrioid adenocarcinoma associated with granulation tissue. Patient proceeded to undergo staging workup with a CT scan of the chest abdomen and pelvis as well as PET scan. There was no evidence for recurrence on the patient's imaging studies however at T10 there was a focal area of increased uptake felt to be related to the patient's multiple myeloma. This information the patient is now seen in radiation oncology for consideration for treatment.  PREVIOUS RADIATION  THERAPY: No  PAST MEDICAL HISTORY:  has a past medical history of Lambda light chain myeloma (11/11/2007); Hypertension (11/28/97); H/O multiple myeloma (11/28/1997); Staphylococcus aureus bacteremia (11/28/1997); Staphylococcus epidermidis bacteremia (11/28/97); Pregnancy induced hypertension; Osteoporosis (12/18/05); Increased BMI (12/18/05); Post-menopausal bleeding (12/19/06); Vaginal atrophy (12/19/06); Elevated hemoglobin A1c; Fibroid (07/26/10); Vaginal atrophy; PONV (postoperative nausea and vomiting); and Endometrial carcinoma (05/28/12).    PAST SURGICAL HISTORY: Past Surgical History  Procedure Laterality Date  . Tubal ligation  1986  . Ectopic pregnancy surgery  1992  . Hysteroscopy w/d&c  09/27/2011    Procedure: DILATATION AND CURETTAGE /HYSTEROSCOPY;  Surgeon: Eldred Manges, MD;  Location: [facility];  Service: Gynecology;;  . Laparotomy  10/31/2011    Procedure: EXPLORATORY LAPAROTOMY;  Surgeon: [name], MD PHD;  Location: [facility];  Service: Gynecology;  Laterality: N/A;  . Abdominal hysterectomy  10/31/2011    Procedure: HYSTERECTOMY ABDOMINAL;  Surgeon: [name], MD PHD;  Location: [facility];  Service: Gynecology;  Laterality: N/A;  . Salpingoophorectomy  10/31/2011    Procedure: SALPINGO OOPHERECTOMY;  Surgeon: [name], MD PHD;  Location: [facility];  Service: Gynecology;  Laterality: Bilateral;   Lymph Nodes sampling    FAMILY HISTORY: family history includes Colon cancer in her mother.  SOCIAL HISTORY:  reports that she has never smoked. She has never used smokeless tobacco. She reports that she does not drink alcohol or use illicit drugs. she works as an Artist school  ALLERGIES: Codeine  MEDICATIONS:  Current Outpatient Prescriptions  Medication Sig Dispense Refill  . calcium carbonate (OS-CAL) 600 MG TABS Take 600 mg by mouth 2 (two) times daily with a meal.       . Ergocalciferol (VITAMIN  D2) 2000 UNITS TABS Take by mouth daily.      [name] Kitchen  ibuprofen (ADVIL,MOTRIN) 200 MG tablet Take 600 mg by mouth every 6 (six) hours as needed. For pain      . lenalidomide (REVLIMID) 15 MG capsule Take 1 capsule (15 mg total) by mouth daily. Auth # [id number]  21 capsule  0  . loperamide (IMODIUM) 2 MG capsule Take 2 mg by mouth as needed. For diarrhea      . loratadine (CLARITIN) 10 MG tablet Take 10 mg by mouth every morning. For allergies during allergy season      . metoprolol (TOPROL-XL) 50 MG 24 hr tablet Take 50 mg by mouth every morning.       . Olmesartan-Amlodipine-HCTZ 20-5-12.5 MG TABS Take 1 tablet by mouth daily.      . potassium chloride SA (K-DUR,KLOR-CON) 20 MEQ tablet Take 1 tablet (20 mEq total) by mouth daily.  60 tablet  0  . Probiotic Product (ALIGN PO) Take 1 capsule by mouth daily.       No current facility-administered medications for this encounter.    REVIEW OF SYSTEMS:  A 15 point review of systems is documented in the electronic medical record. This was obtained by the nursing staff. However, I reviewed this with the patient to discuss relevant findings and make appropriate changes.  She denies any new back pain. She denies any cough or breathing problems. She denies any further vaginal bleeding but does have some mild discharge. She denies any urinary difficulties or bowel complaints.   PHYSICAL EXAM:  weight is 188 lb (85.276 kg). Her oral temperature is 98.6 F (37 C). Her blood pressure is 137/67 and her pulse is 68. Her respiration is 20.   BP 137/67  Pulse 68  Temp(Src) 98.6 F (37 C) (Oral)  Resp 20  Wt 188 lb (85.276 kg)  BMI 33.06 kg/m2  General Appearance:    Alert, cooperative, no distress, appears stated age  Head:    Normocephalic, without obvious abnormality, atraumatic  Eyes:    PERRL, conjunctiva/corneas clear, EOM's intact    Ears:    Normal TM's and external ear canals, both ears  Nose:   Nares normal, septum midline, mucosa normal, no drainage    or sinus tenderness  Throat:   Lips, mucosa,  and tongue normal; teeth and gums normal  Neck:   Supple, symmetrical, trachea midline, no adenopathy;    thyroid:  no enlargement/tenderness/nodules; no carotid   bruit or JVD  Back:     Symmetric, no curvature, ROM normal, no CVA tenderness  Lungs:     Clear to auscultation bilaterally, respirations unlabored  Chest Wall:    No tenderness or deformity   Heart:    Regular rate and rhythm,         Abdomen:     Soft, non-tender, bowel sounds active all four quadrants,    no masses, no organomegaly  Genitalia:    Normal female without lesion or tenderness, there are biopsy changes noted in the proximal vagina as well as the distal left vaginal wall with associated granulation tissue,  there is a clear to white discharge but no bleeding, no palpable mass on bimanual exam      Extremities:   Extremities normal, atraumatic, no cyanosis or edema  Pulses:   2+ and symmetric all extremities  Skin:   Skin color, texture, turgor normal, no rashes or lesions  Lymph nodes:   Cervical, supraclavicular, and axillary nodes normal  Neurologic:    normal strength, sensation and reflexes    Throughout   ECOG = 0  LABORATORY DATA:  Lab Results  Component Value Date   WBC 3.3* 04/29/2012   HGB 10.4* 04/29/2012   HCT 31.3* 04/29/2012   MCV 79* 04/29/2012   PLT 255 04/29/2012   Lab Results  Component Value Date   NA 140 04/29/2012   K 3.6 04/29/2012   CL 105 04/29/2012   CO2 28 04/29/2012   Lab Results  Component Value Date   ALT 17 04/29/2012   AST 15 04/29/2012   ALKPHOS 41 04/29/2012   BILITOT 0.4 04/29/2012     RADIOGRAPHY: Ct Chest W Contrast  06/05/2012  **ADDENDUM** CREATED: 06/05/2012 12:02:47  The impression from the chest portion of the examination should read :  1. No suspicious pulmonary nodule or mass identified. No adenopathy. 2. Multiple lytic lesions of myeloma.  **END ADDENDUM** SIGNED BY: Angelita Ingles, M.D.   06/05/2012  *RADIOLOGY REPORT*  Clinical Data:  Evaluate for recurrent  endometrial carcinoma  CT CHEST, ABDOMEN AND PELVIS WITH CONTRAST  Technique:  Multidetector CT imaging of the chest, abdomen and pelvis was performed following the standard protocol during bolus administration of intravenous contrast.  Contrast: 131mL OMNIPAQUE IOHEXOL 300 MG/ML  SOLN  Comparison:  Pelvic MRI 08/23/11  CT CHEST  Findings:  Lungs/pleura: No pleural effusion.  No suspicious pulmonary nodule or mass identified.  Heart/Mediastinum: Heart size is normal.  No pericardial effusion identified.  No enlarged mediastinal or hilar lymph nodes.  Upper abdomen: There is no axillary or supraclavicular adenopathy.  Bones/Musculoskeletal:  No axillary or supraclavicular adenopathy noted.  Multi focal lytic lesions of myeloma are identified throughout the bony thorax.  IMPRESSION:  1.  This suspicious pulmonary nodule or mass identified.  No adenopathy. 2.  Multiple lytic lesions of myeloma.  CT ABDOMEN AND PELVIS  Findings:  There is no focal liver abnormality.  The gallbladder is normal.  Normal appearance of the pancreas.  The spleen is unremarkable.  Normal appearance of both adrenal glands.  The right kidney is normal.  Cyst is noted within the inferior pole of the left kidney measuring 1.8 cm.  Urinary bladder appears normal.  Previous hysterectomy.  Normal caliber of the abdominal aorta.  There is no upper abdominal adenopathy identified.  No enlarged pelvic or inguinal lymph nodes. No ascites within the abdomen or pelvis.  The stomach appears normal.  The small bowel loops have a normal course and caliber.  No obstruction.  Normal appearance of the colon.  No peritoneal nodule or mass identified.  Review of the visualized osseous structures again shows multifocal lucent lesions within the lumbar spine and bony pelvis compatible with the history of myeloma.  IMPRESSION:  1.  There is no evidence for soft tissue metastatic disease.  No metastatic adenopathy or evidence of transperitoneal spread of tumor. 2.   Multiple lesions of myeloma within the lumbar spine and pelvis.  Original Report Authenticated By: Kerby Moors, M.D.    Ct Abdomen Pelvis W Contrast  06/05/2012  **ADDENDUM** CREATED: 06/05/2012 12:02:47  The impression from the chest portion of the examination should read :  1. No suspicious pulmonary nodule or mass identified. No adenopathy. 2. Multiple lytic lesions of myeloma.  **END ADDENDUM** SIGNED BY: Angelita Ingles, M.D.   06/05/2012  *RADIOLOGY REPORT*  Clinical Data:  Evaluate for recurrent endometrial carcinoma  CT CHEST, ABDOMEN AND PELVIS WITH CONTRAST  Technique:  Multidetector CT imaging  of the chest, abdomen and pelvis was performed following the standard protocol during bolus administration of intravenous contrast.  Contrast: 130mL OMNIPAQUE IOHEXOL 300 MG/ML  SOLN  Comparison:  Pelvic MRI 08/23/11  CT CHEST  Findings:  Lungs/pleura: No pleural effusion.  No suspicious pulmonary nodule or mass identified.  Heart/Mediastinum: Heart size is normal.  No pericardial effusion identified.  No enlarged mediastinal or hilar lymph nodes.  Upper abdomen: There is no axillary or supraclavicular adenopathy.  Bones/Musculoskeletal:  No axillary or supraclavicular adenopathy noted.  Multi focal lytic lesions of myeloma are identified throughout the bony thorax.  IMPRESSION:  1.  This suspicious pulmonary nodule or mass identified.  No adenopathy. 2.  Multiple lytic lesions of myeloma.  CT ABDOMEN AND PELVIS  Findings:  There is no focal liver abnormality.  The gallbladder is normal.  Normal appearance of the pancreas.  The spleen is unremarkable.  Normal appearance of both adrenal glands.  The right kidney is normal.  Cyst is noted within the inferior pole of the left kidney measuring 1.8 cm.  Urinary bladder appears normal.  Previous hysterectomy.  Normal caliber of the abdominal aorta.  There is no upper abdominal adenopathy identified.  No enlarged pelvic or inguinal lymph nodes. No ascites within the  abdomen or pelvis.  The stomach appears normal.  The small bowel loops have a normal course and caliber.  No obstruction.  Normal appearance of the colon.  No peritoneal nodule or mass identified.  Review of the visualized osseous structures again shows multifocal lucent lesions within the lumbar spine and bony pelvis compatible with the history of myeloma.  IMPRESSION:  1.  There is no evidence for soft tissue metastatic disease.  No metastatic adenopathy or evidence of transperitoneal spread of tumor. 2.  Multiple lesions of myeloma within the lumbar spine and pelvis.  Original Report Authenticated By: Kerby Moors, M.D.    Nm Pet Image Initial (pi) Skull Base To Thigh  06/05/2012  *RADIOLOGY REPORT*  Clinical Data: Initial treatment strategy for endometrial cancer.  NUCLEAR MEDICINE PET SKULL BASE TO THIGH  Fasting Blood Glucose:  100  Technique:  14.5 mCi F-18 FDG was injected intravenously. CT data was obtained and used for attenuation correction and anatomic localization only.  (This was not acquired as a diagnostic CT examination.) Additional exam technical data entered on technologist worksheet.  Comparison:  None  Findings:  Neck: No hypermetabolic lymph nodes in the neck.  Chest:  No hypermetabolic mediastinal or hilar nodes.  No suspicious pulmonary nodules on the CT scan.  Abdomen/Pelvis:  No abnormal hypermetabolic activity within the liver, pancreas, adrenal glands, or spleen.  No hypermetabolic lymph nodes in the abdomen or pelvis.  Skeleton:  Innumerable lytic lesions of myeloma are identified throughout the axial skeleton.  Focal area of increased uptake localizing to the T10 level has an SUV max equal to 6.8.  No additional areas of increased uptake are identified.  IMPRESSION:  1.  There are no specific features identified to suggest hypermetabolic metastasis from endometrial carcinoma. 2.  Skeletal changes of multiple myeloma with focal area of increased uptake localizing to the T10 vertebra.    Original Report Authenticated By: Kerby Moors, M.D.       IMPRESSION: Recurrent endometrial cancer. Patient's recurrence appears to be isolated to the vaginal vault but in distinctly different areas. For this recurrence I would recommend a combination of external beam and intracavitary brachytherapy treatments but would like to discuss this issue further with Dr. Skeet Latch  prior to making formal recommendations. I did discuss external beam therapy with Dr. Marin Olp medical  oncologist who does not feel this would not be a issue in terms of bone marrow effects as related to her previous multiple myeloma treatments. To lessen the potential toxicity of external beam radiation therapy to the small bowel and bone marrow, I would recommend intensity modulated radiation therapy for her external beam treatments the patient may need time to heal  from her recent biopsies.  PLAN: The patient will be scheduled for simulation and treatment in the near future. I spent 60 minutes minutes face to face with the patient and more than 50% of that time was spent in counseling and/or coordination of care.   ------------------------------------------------  -----------------------------------  Blair Promise, PhD, MD

## 2012-06-06 NOTE — Progress Notes (Signed)
Please see the Nurse Progress Note in the MD Initial Consult Encounter for this patient. 

## 2012-06-07 NOTE — Addendum Note (Signed)
Encounter addended by: Andria Rhein, RN on: 06/07/2012  9:10 AM<BR>     Documentation filed: Charges VN

## 2012-06-13 ENCOUNTER — Ambulatory Visit: Payer: BC Managed Care – PPO | Admitting: Gynecologic Oncology

## 2012-06-14 ENCOUNTER — Other Ambulatory Visit: Payer: Self-pay | Admitting: *Deleted

## 2012-06-14 DIAGNOSIS — C9000 Multiple myeloma not having achieved remission: Secondary | ICD-10-CM

## 2012-06-14 MED ORDER — LENALIDOMIDE 15 MG PO CAPS: 15.0000 mg | ORAL_CAPSULE | Freq: Every day | ORAL | Status: DC

## 2012-06-14 NOTE — Telephone Encounter (Signed)
Received a refill request from [facility] for Revlimid. She is taking this 21 days on 7 days off. Obtained auth # [id number]. Faxed it back to [phone] once signed by Dr Marin Olp.

## 2012-06-27 ENCOUNTER — Telehealth: Payer: Self-pay | Admitting: *Deleted

## 2012-06-27 NOTE — Telephone Encounter (Signed)
CALLED PATIENT TO INFORM OF NEW HDR VAG. CUFF CASE- NEW START ON 07-03-12, LVM FOR A RETURN CALL

## 2012-07-01 ENCOUNTER — Telehealth: Payer: Self-pay | Admitting: *Deleted

## 2012-07-01 NOTE — Telephone Encounter (Signed)
Left vm returning pts call

## 2012-07-01 NOTE — Telephone Encounter (Signed)
Received call back from pt. She has questions re: HDR ct sim appointments on 07/03/12. Explained process for 07/03/12 to pt; answered all questions. Reviewed pt's following appointments with her. Pt verbalized understanding, stated "you have been quite helpful".

## 2012-07-02 ENCOUNTER — Telehealth: Payer: Self-pay | Admitting: *Deleted

## 2012-07-02 NOTE — Telephone Encounter (Signed)
CALLED PATIENT TO REMIND OF APPTS. FOR 07-03-12, LVM FOR A RETURN CALL

## 2012-07-03 ENCOUNTER — Ambulatory Visit
Admission: RE | Admit: 2012-07-03 | Discharge: 2012-07-03 | Disposition: A | Discharge status: Home / Self Care | Payer: BC Managed Care – PPO | Source: Ambulatory Visit | Attending: Radiation Oncology | Admitting: Radiation Oncology

## 2012-07-03 ENCOUNTER — Ambulatory Visit: Payer: BC Managed Care – PPO

## 2012-07-03 DIAGNOSIS — C541 Malignant neoplasm of endometrium: Secondary | ICD-10-CM

## 2012-07-03 DIAGNOSIS — C549 Malignant neoplasm of corpus uteri, unspecified: Secondary | ICD-10-CM | POA: Insufficient documentation

## 2012-07-03 DIAGNOSIS — Z510 Encounter for antineoplastic radiation therapy: Secondary | ICD-10-CM | POA: Insufficient documentation

## 2012-07-03 NOTE — Progress Notes (Signed)
   Department of Radiation Oncology  Phone:  [phone] Fax:        [phone]  Vaginal brachytherapy procedure note  Patient was taken to the nursing suite and placed in the dorsolithotomy position. A pelvic exam was performed showing slight biopsy changes in the vaginal apex as well as the left lateral distal vaginal wall. No bulky tumor was appreciated on clinical exam. The patient proceeded to undergo fitting for her vaginal cylinder. The optimal diameter will be a 3 cm cylinder for treatment. Since 2 areas were found to be positive at different locations within the vaginal vault, the patient will proceed with treatments to the entire vaginal mucosal region.  She does understand that she may be at increased risk for vaginal fibrosis given the length of treatment. The approximate treatment length will be between 5.5 and 6 cm. The patient tolerated the procedure well.  Simple treatment device note  The patient had construction of her custom cylinder for treatment. The treatment diameter will be 3 cm. Patient had placement of  Three 3 cm diameter rings  followed by two 2.5 cm rings toward the introitus.  -----------------------------------  Blair Promise, PhD, MD

## 2012-07-03 NOTE — Progress Notes (Signed)
   Department of Radiation Oncology  Phone:  [phone] Fax:        [phone]  Progress note  Diagnosis: recurrent endometrial cancer  Patient returns today for planning for radiation therapy.  I was able to discuss the patient's management with Dr. Skeet Latch. Given the fact that the PET scan showed no active disease and clinical exam showed only granulation tissue, Dr. Skeet Latch felt that intracavitary brachytherapy treatments alone would be the optimal treatment for this patient. Since the patient does not have any significant disease bulk I would agree with this recommendation. If the patient fails in the pelvis at a later date then she would still be a candidate for external beam radiation therapy directed at this area. I discussed these issues with the patient and her husband today. Patient is in agreement to proceed with the intracavitary brachytherapy treatments as planned. She appears to understand the course of treatment, side effects and potential toxicities.  -----------------------------------  Blair Promise, PhD, MD

## 2012-07-03 NOTE — Progress Notes (Signed)
  Radiation Oncology         ([phone]) [phone] ________________________________  Name: SHAWNICE GANIS MRN: [id number]  Date: 07/03/2012  DOB: [birth year]  SIMULATION AND TREATMENT PLANNING NOTE  DIAGNOSIS:  Recurrent endometrial cancer  NARRATIVE:  The patient was brought to the [place] suite.  Identity was confirmed.  All relevant records and images related to the planned course of therapy were reviewed.  The patient freely provided informed written consent to proceed with treatment after reviewing the details related to the planned course of therapy. The consent form was witnessed and verified by the simulation staff.  Then, the patient was set-up in a stable reproducible  supine position for radiation therapy.  CT images were obtained.  Surface markings were placed.  The CT images were loaded into the planning software.  Then the target and avoidance structures were contoured.  Treatment planning then occurred.  The radiation prescription was entered and confirmed.    PLAN:  The patient will receive high-dose rate brachytherapy treatments using iridium 192. The patient will  receive between 5 and 6 treatments on a weekly or biweekly basis. Prescription will be approximately 6 gray to the vaginal mucosal surface. Treatment length will be approximately 5-1/2-6 cm.  ________________________________  -----------------------------------  Blair Promise, PhD, MD

## 2012-07-03 NOTE — Progress Notes (Signed)
Pt denies pain, urinary or bowel issues, vaginal discharge, fatigue, loss of appetite. She is here for HDR ct sim. Husband w/pt.

## 2012-07-03 NOTE — Progress Notes (Signed)
   Department of Radiation Oncology  Phone:  [phone] Fax:        [phone]  High-dose-rate brachytherapy procedure note  After planning was complete the patient was transferred to the high dose rate suite. The previously constructed vaginal cylinder was placed in the vaginal vault. The cylinder was affixed to the CT/MR stabilization plate to prevent slippage.  Verification simulation note  A fiducial marker was placed within the vaginal cylinder. An AP and lateral film was obtained in the treatment position. This was compared to the patient's planning films earlier in the day documenting good position of the vaginal cylinder for treatment.   High-dose-rate brachytherapy treatment  The remote afterloading equipment was attached to the vaginal cylinder. The patient proceeded to undergo her first high-dose-rate treatment. She was prescribed a dose of 6 Gy to the vaginal mucosal surface. This was achieved with a total dwell time of 509.4 seconds. A total of 13 dwell positions were used to deliver the treatment with 1 channel. Patient tolerated the procedure well. After completion of her therapy a radiation survey was performed documenting return of the iridium source into the Nucletron safe.   -----------------------------------  Blair Promise, PhD, MD

## 2012-07-09 ENCOUNTER — Telehealth: Payer: Self-pay | Admitting: *Deleted

## 2012-07-09 NOTE — Telephone Encounter (Signed)
CALLED PATIENT TO REMIND OF HDR [facility] 07-10-12, LVM FOR A RETURN CALL.

## 2012-07-09 NOTE — Telephone Encounter (Signed)
CALLED PATIENT TO HAVE HER ARRIVE AT 8:00 AM ON 07-10-12, SPOKE WITH HER HUSBAND RUSSELL AND HE SAID THAT SHE WOULD BE HERE AT 8:00 AM

## 2012-07-10 ENCOUNTER — Encounter: Payer: Self-pay | Admitting: Radiation Oncology

## 2012-07-10 ENCOUNTER — Ambulatory Visit
Admission: RE | Admit: 2012-07-10 | Discharge: 2012-07-10 | Disposition: A | Discharge status: Home / Self Care | Payer: BC Managed Care – PPO | Source: Ambulatory Visit | Attending: Radiation Oncology | Admitting: Radiation Oncology

## 2012-07-10 NOTE — Progress Notes (Signed)
High-dose-rate brachytherapy procedure note    Vaginal brachytherapy procedure note    Patient was placed in the dorsolithotomy position. A pelvic exam was performed  No bulky tumor was appreciated on clinical exam. The patient proceeded to undergo placement of her vaginal cylinder. The optimal diameter will be a 3 cm cylinder for treatment. Since 2 areas were found to be positive at different locations within the vaginal vault, the patient will proceed with treatments to the entire vaginal mucosal region. The patient tolerated the procedure well.   Simple treatment device note  The patient had construction of her custom cylinder for treatment. The treatment diameter will be 3 cm. Patient had placement of Three 3 cm diameter rings followed by two 2.5 cm rings toward the introitus.   Verification simulation note   A fiducial marker was placed within the vaginal cylinder. An AP and lateral film was obtained in the treatment position. This was compared to the patient's planning films earlier in the day documenting good position of the vaginal cylinder for treatment.   High-dose-rate brachytherapy treatment   The remote afterloading equipment was attached to the vaginal cylinder. The patient proceeded to undergo her second high-dose-rate treatment. She was prescribed a dose of 6 Gy to the vaginal mucosal surface. This was achieved with a total dwell time of 543.1seconds. A total of 13 dwell positions were used to deliver the treatment with 1 channel. Patient tolerated the procedure well. After completion of her therapy a radiation survey was performed documenting return of the iridium source into the Nucletron safe.  -----------------------------------   Blair Promise, PhD, MD

## 2012-07-16 ENCOUNTER — Encounter: Payer: Self-pay | Admitting: *Deleted

## 2012-07-17 ENCOUNTER — Telehealth: Payer: Self-pay | Admitting: *Deleted

## 2012-07-17 NOTE — Telephone Encounter (Signed)
CALLED PATIENT TO REMIND OF HDR [place]. FOR 07-18-12 AT 10:00 AM , LVM FOR A RETURN CALL

## 2012-07-18 ENCOUNTER — Ambulatory Visit
Admission: RE | Admit: 2012-07-18 | Discharge: 2012-07-18 | Disposition: A | Discharge status: Home / Self Care | Payer: BC Managed Care – PPO | Source: Ambulatory Visit | Attending: Radiation Oncology | Admitting: Radiation Oncology

## 2012-07-18 DIAGNOSIS — C541 Malignant neoplasm of endometrium: Secondary | ICD-10-CM

## 2012-07-18 NOTE — Progress Notes (Signed)
High-dose-rate brachytherapy procedure note   Vaginal brachytherapy procedure note   Patient was placed in the dorsolithotomy position. A pelvic exam was performed No bulky tumor was appreciated on clinical exam. The patient proceeded to undergo placement of her vaginal cylinder. The optimal diameter will be a 3 cm cylinder for treatment. Since 2 areas were found to be positive at different locations within the vaginal vault, the patient will proceed with treatments to the entire vaginal mucosal region. The patient tolerated the procedure well.   Simple treatment device note   The patient had construction of her custom cylinder for treatment. The treatment diameter will be 3 cm. Patient had placement of Three 3 cm diameter rings followed by two 2.5 cm rings toward the introitus.   Verification simulation note   A fiducial marker was placed within the vaginal cylinder. An AP and lateral film was obtained in the treatment position. This was compared to the patient's planning films earlier in the day documenting good position of the vaginal cylinder for treatment.   High-dose-rate brachytherapy treatment   The remote afterloading equipment was attached to the vaginal cylinder. The patient proceeded to undergo her third high-dose-rate treatment. She was prescribed a dose of 6 Gy to the vaginal mucosal surface. This was achieved with a total dwell time of 264.9 seconds. A total of 13 dwell positions were used to deliver the treatment with 1 channel. Patient tolerated the procedure well. After completion of her therapy a radiation survey was performed documenting return of the iridium source into the Nucletron safe.  A source change was made after the patient's second high-dose-rate treatment. This resulted in a higher activity iridium source and a shorter treatment time for her third application. -----------------------------------   Blair Promise, PhD, MD

## 2012-07-22 ENCOUNTER — Telehealth: Payer: Self-pay | Admitting: Hematology & Oncology

## 2012-07-22 NOTE — Telephone Encounter (Signed)
Left pt message moved 7-16 to 7-25

## 2012-07-23 ENCOUNTER — Telehealth: Payer: Self-pay | Admitting: *Deleted

## 2012-07-23 NOTE — Telephone Encounter (Signed)
CALLED PATIENT TO REMIND OF [place]. FOR 07-24-12 AT 10 AM, LVM FOR A RETURN CALL

## 2012-07-24 ENCOUNTER — Ambulatory Visit
Admission: RE | Admit: 2012-07-24 | Discharge: 2012-07-24 | Disposition: A | Discharge status: Home / Self Care | Payer: BC Managed Care – PPO | Source: Ambulatory Visit | Attending: Radiation Oncology | Admitting: Radiation Oncology

## 2012-07-24 VITALS — BP 137/82 | HR 61 | Temp 97.5°F | Resp 20 | Wt 192.0 lb

## 2012-07-24 DIAGNOSIS — C541 Malignant neoplasm of endometrium: Secondary | ICD-10-CM

## 2012-07-24 NOTE — Progress Notes (Signed)
Pt denies pain, urinary/bowel issues, vaginal discharge, nausea. She states she has noticed her feet swelling x 1 week.

## 2012-07-24 NOTE — Progress Notes (Signed)
   Department of Radiation Oncology  Phone:  [phone] Fax:        [phone]  High-dose-rate brachytherapy procedure note   Vaginal brachytherapy procedure note   Patient was placed in the dorsolithotomy position. A pelvic exam was performed No bulky tumor was appreciated on clinical exam. There were some radiation changes noted particularly in the proximal vagina The patient proceeded to undergo placement of her vaginal cylinder. The optimal diameter will be a 3 cm cylinder for treatment. Since 2 areas were found to be positive at different locations within the vaginal vault, the patient will proceed with treatments to the entire vaginal mucosal region. The patient tolerated the procedure well.   Simple treatment device note   The patient had construction of her custom cylinder for treatment. The treatment diameter will be 3 cm. Patient had placement of Three 3 cm diameter rings followed by two 2.5 cm rings toward the introitus.   Verification simulation note   A fiducial marker was placed within the vaginal cylinder. An AP and lateral film was obtained in the treatment position. This was compared to the patient's planning films earlier in the day documenting good position of the vaginal cylinder for treatment.   High-dose-rate brachytherapy treatment   The remote afterloading equipment was attached to the vaginal cylinder. The patient proceeded to undergo her fourth high-dose-rate treatment. She was prescribed a dose of 6 Gy to the vaginal mucosal surface. This was achieved with a total dwell time of 277.7 seconds. A total of 13 dwell positions were used to deliver the treatment with 1 channel. Patient tolerated the procedure well. After completion of her therapy a radiation survey was performed documenting return of the iridium source into the Nucletron safe. A source change was made after the patient's second high-dose-rate treatment. This resulted in a higher activity iridium  source and a shorter treatment time for her third application.  -----------------------------------   Blair Promise, PhD, MD

## 2012-07-25 ENCOUNTER — Telehealth: Payer: Self-pay | Admitting: *Deleted

## 2012-07-25 NOTE — Telephone Encounter (Signed)
Called patient to remind of HDR Tx. For 07-29-12, lvm for a return call

## 2012-07-26 ENCOUNTER — Other Ambulatory Visit: Payer: Self-pay | Admitting: *Deleted

## 2012-07-26 DIAGNOSIS — C9000 Multiple myeloma not having achieved remission: Secondary | ICD-10-CM

## 2012-07-26 MED ORDER — LENALIDOMIDE 15 MG PO CAPS: 15.0000 mg | ORAL_CAPSULE | Freq: Every day | ORAL | Status: DC

## 2012-07-29 ENCOUNTER — Ambulatory Visit
Admission: RE | Admit: 2012-07-29 | Discharge: 2012-07-29 | Disposition: A | Discharge status: Home / Self Care | Payer: BC Managed Care – PPO | Source: Ambulatory Visit | Attending: Radiation Oncology | Admitting: Radiation Oncology

## 2012-07-29 DIAGNOSIS — C541 Malignant neoplasm of endometrium: Secondary | ICD-10-CM

## 2012-07-29 NOTE — Progress Notes (Signed)
Department of Radiation Oncology  Phone: [phone]  Fax: [phone]   High-dose-rate brachytherapy procedure note   Vaginal brachytherapy procedure note   Patient was placed in the dorsolithotomy position. A pelvic exam was performed.  No palpable tumor was appreciated. on clinical exam.  The patient proceeded to undergo placement of her vaginal cylinder. The optimal diameter will be a 3 cm cylinder for treatment. Since 2 areas were found to be positive at different locations within the vaginal vault, the patient will proceed with treatments to the entire vaginal mucosal region. The patient tolerated the procedure well.   Simple treatment device note   The patient had construction of her custom cylinder for treatment. The treatment diameter will be 3 cm. Patient had placement of Three 3 cm diameter rings followed by two 2.5 cm rings toward the introitus.   Verification simulation note   A fiducial marker was placed within the vaginal cylinder. An AP and lateral film was obtained in the treatment position. This was compared to the patient's planning films documenting good position of the vaginal cylinder for treatment.   High-dose-rate brachytherapy treatment   The remote afterloading equipment was attached to the vaginal cylinder. The patient proceeded to undergo her fifth high-dose-rate treatment. She was prescribed a dose of 6.5 Gy to the vaginal mucosal surface. This was achieved with a total dwell time of 319.7 seconds. A total of 13 dwell positions were used to deliver the treatment with 1 channel. Patient tolerated the procedure well. After completion of her therapy a radiation survey was performed documenting return of the iridium source into the Nucletron safe.   The patient underwent re- planning for her fifth treatment since a higher dose was prescribed for the fifth treatment. -----------------------------------   Blair Promise, PhD, MD

## 2012-07-31 ENCOUNTER — Other Ambulatory Visit: Payer: BC Managed Care – PPO | Admitting: Lab

## 2012-07-31 ENCOUNTER — Other Ambulatory Visit (HOSPITAL_BASED_OUTPATIENT_CLINIC_OR_DEPARTMENT_OTHER): Payer: BC Managed Care – PPO | Admitting: Lab

## 2012-07-31 DIAGNOSIS — C9000 Multiple myeloma not having achieved remission: Secondary | ICD-10-CM

## 2012-07-31 LAB — CMP (CANCER CENTER ONLY)
ALT(SGPT): 19 U/L (ref 10–47)
AST: 23 U/L (ref 11–38)
Albumin: 3.8 g/dL (ref 3.3–5.5)
Alkaline Phosphatase: 49 U/L (ref 26–84)
BUN, Bld: 17 mg/dL (ref 7–22)
CO2: 29 mEq/L (ref 18–33)
Calcium: 10.3 mg/dL (ref 8.0–10.3)
Chloride: 107 mEq/L (ref 98–108)
Creat: 1.1 mg/dl (ref 0.6–1.2)
Glucose, Bld: 102 mg/dL (ref 73–118)
Potassium: 3.5 mEq/L (ref 3.3–4.7)
Sodium: 140 mEq/L (ref 128–145)
Total Bilirubin: 0.7 mg/dl (ref 0.20–1.60)
Total Protein: 7 g/dL (ref 6.4–8.1)

## 2012-07-31 LAB — CBC WITH DIFFERENTIAL (CANCER CENTER ONLY)
BASO#: 0.1 10*3/uL (ref 0.0–0.2)
BASO%: 2.8 % — ABNORMAL HIGH (ref 0.0–2.0)
EOS%: 2.8 % (ref 0.0–7.0)
Eosinophils Absolute: 0.1 10*3/uL (ref 0.0–0.5)
HCT: 33 % — ABNORMAL LOW (ref 34.8–46.6)
HGB: 11 g/dL — ABNORMAL LOW (ref 11.6–15.9)
LYMPH#: 0.9 10*3/uL (ref 0.9–3.3)
LYMPH%: 27.7 % (ref 14.0–48.0)
MCH: 26.4 pg (ref 26.0–34.0)
MCHC: 33.3 g/dL (ref 32.0–36.0)
MCV: 79 fL — ABNORMAL LOW (ref 81–101)
MONO#: 0.5 10*3/uL (ref 0.1–0.9)
MONO%: 17 % — ABNORMAL HIGH (ref 0.0–13.0)
NEUT#: 1.6 10*3/uL (ref 1.5–6.5)
NEUT%: 49.7 % (ref 39.6–80.0)
Platelets: 234 10*3/uL (ref 145–400)
RBC: 4.16 10*6/uL (ref 3.70–5.32)
RDW: 17 % — ABNORMAL HIGH (ref 11.1–15.7)
WBC: 3.2 10*3/uL — ABNORMAL LOW (ref 3.9–10.0)

## 2012-08-01 LAB — KAPPA/LAMBDA LIGHT CHAINS
Kappa free light chain: 0.97 mg/dL (ref 0.33–1.94)
Kappa:Lambda Ratio: 0.04 — ABNORMAL LOW (ref 0.26–1.65)
Lambda Free Lght Chn: 22.5 mg/dL — ABNORMAL HIGH (ref 0.57–2.63)

## 2012-08-05 ENCOUNTER — Other Ambulatory Visit: Payer: BC Managed Care – PPO | Admitting: Lab

## 2012-08-05 DIAGNOSIS — C9000 Multiple myeloma not having achieved remission: Secondary | ICD-10-CM

## 2012-08-07 ENCOUNTER — Encounter: Payer: Self-pay | Admitting: Radiation Oncology

## 2012-08-07 LAB — UIFE/LIGHT CHAINS/TP QN, 24-HR UR
Albumin, U: DETECTED
Alpha 1, Urine: DETECTED — AB
Alpha 2, Urine: DETECTED — AB
Beta, Urine: DETECTED — AB
Free Kappa Lt Chains,Ur: 1.7 mg/dL (ref 0.14–2.42)
Free Kappa/Lambda Ratio: 0.02 ratio — ABNORMAL LOW (ref 2.04–10.37)
Free Lambda Excretion/Day: 1238.4 mg/d
Free Lambda Lt Chains,Ur: 77.4 mg/dL — ABNORMAL HIGH (ref 0.02–0.67)
Free Lt Chn Excr Rate: 27.2 mg/d
Gamma Globulin, Urine: DETECTED — AB
Time: 24 hours
Total Protein, Urine-Ur/day: 1280 mg/d — ABNORMAL HIGH (ref 10–140)
Total Protein, Urine: 80 mg/dL
Volume, Urine: 1600 mL

## 2012-08-07 NOTE — Progress Notes (Signed)
  Radiation Oncology         [phone] ________________________________  Name: Emily Greer MRN: [id number]  Date: 08/07/2012  DOB: 18-Feb-1950  End of Treatment Note  Diagnosis:   Recurrent endometrial cancer     Indication for treatment:  Recurrence within the vaginal vault       Radiation treatment dates:   June 4, June 11, June 19, June 25, June 30  Site/dose: Vagina,  30.5 Gy in 5 fractions (6 Gy, 6 Gy, 6 Gy, 6 Gy, 6.5 Gy)  Beams/energy:   The patient was treated with high dose rate brachytherapy alone using iridium 192. Prescription was to the mucosal surface. the majority of the vaginal mucosa was treated in light of the 2 separate locations of the lesions. A 3 CM diameter cylinder was used to deliver the treatment.  Narrative: The patient tolerated radiation treatment relatively well.   Towards the end of her therapy she did experience some discomfort in the vaginal area with cylinder insertion  Plan: The patient has completed radiation treatment. The patient will return to radiation oncology clinic for routine followup in one month. I advised them to call or return sooner if they have any questions or concerns related to their recovery or treatment.  -----------------------------------  Blair Promise, PhD, MD

## 2012-08-14 ENCOUNTER — Ambulatory Visit: Payer: BC Managed Care – PPO | Admitting: Medical

## 2012-08-14 ENCOUNTER — Telehealth: Payer: Self-pay | Admitting: Gynecologic Oncology

## 2012-08-14 ENCOUNTER — Ambulatory Visit: Payer: BC Managed Care – PPO

## 2012-08-14 NOTE — Telephone Encounter (Signed)
Office Visit:  GYN ONCOLOGY   CC:   Endometrial cancer surveillance   Assessment:   62 y.o.  year old with Stage1A Grade 2 endometrioid endometrial cancer with recurrence at the distal vagina identified in April 2014.  Subsequent PET scan was negative for evidence of metastatic disease and Emily Greer has completed vaginal  brachytherapy.     Patient's been advised to followup in 3 months. Follow-up with Dr. Sondra Come in 6 months Follow-up with Dr. Leo Grosser after the vaginal bleeding is optimally managed    HPI:  Emily Greer is a 62 y.o. year old G3P2 initially seen in consultation on 10/04/2001  grade 1  endometrial cancer  She then underwent a  total abdominal hysterectomy bilateral salpingo-oophorectomy bilateral pelvic lymph node dissection on  [id number] without complications.  Her postoperative course was  uncomplicated.  Her final pathologic diagnosis is a Stage  1A Grade  2 endometrioid endometrial cancer with  negative lymphovascular space invasion,  2/20 (10%) of myometrial invasion and negative lymph nodes.  On 1/14 visit she reported  post coital vaginal bleeding  that was self limiting. Vaginal biopsy - LARGELY DENUDED SQUAMOUS EPITHELIUM WITH ASSOCIATED SPONGIOSIS AND CHRONIC INFLAMMATION.- NO DYSPLASIA OR MALIGNANCY  On the visit 04/2012 she c/o vaginal bleeding.  Biopsies were c/w granulation tissue at the vaginal coughmetastatic disease at the distal vagina  PET 06/05/2012 IMPRESSION:  1. There are no specific features identified to suggest  hypermetabolic metastasis from endometrial carcinoma.  2. Skeletal changes of multiple myeloma with focal area of increased uptake    Treated with vaginal brachytherapy June 4, June 11, June 19, June 25, June 30  Site/dose: Vagina, 30.5 Gy in 5 fractions (6 Gy, 6 Gy, 6 Gy, 6 Gy, 6.5 Gy)    Past Medical History  Diagnosis Date  . Lambda light chain myeloma 11/11/2007  . Hypertension 11/28/97  . H/O multiple myeloma  11/28/1997  . Staphylococcus aureus bacteremia 11/28/1997  . Staphylococcus epidermidis bacteremia 11/28/97  . Pregnancy induced hypertension   . Osteoporosis 12/18/05    Increased  risk   . Increased BMI 12/18/05  . Post-menopausal bleeding 12/19/06  . Vaginal atrophy 12/19/06  . Elevated hemoglobin A1c     Borderline  . Fibroid 07/26/10    asymptomatic  . Vaginal atrophy   . PONV (postoperative nausea and vomiting)     nausea in past, none recent  . Endometrial carcinoma 05/28/12   Colonoscopy scheduled for 02/29/2012 Mammogram 2013 wnl per patient  Past Surgical History  Procedure Laterality Date  . Tubal ligation  1986  . Ectopic pregnancy surgery  1992  . Hysteroscopy w/d&c  09/27/2011    Procedure: DILATATION AND CURETTAGE /HYSTEROSCOPY;  Surgeon: Eldred Manges, MD;  Location: [facility];  Service: Gynecology;;  . Laparotomy  10/31/2011    Procedure: EXPLORATORY LAPAROTOMY;  Surgeon: [name], MD PHD;  Location: [facility];  Service: Gynecology;  Laterality: N/A;  . Abdominal hysterectomy  10/31/2011    Procedure: HYSTERECTOMY ABDOMINAL;  Surgeon: [name], MD PHD;  Location: [facility];  Service: Gynecology;  Laterality: N/A;  . Salpingoophorectomy  10/31/2011    Procedure: SALPINGO OOPHERECTOMY;  Surgeon: [name], MD PHD;  Location: [facility];  Service: Gynecology;  Laterality: Bilateral;   Lymph Nodes sampling   Social History:  Accompanied by her husband.  Stable work envirnment  Review of systems: Constitutional:  She has no fever or chills. Cardiovascular: No chest pain, palpitations or edema. Respiratory:  No shortness of  breath, wheezing or cough Gastrointestinal: She has normal bowel movements without diarrhea or constipation. She denies any nausea or vomiting. Genitourinary:  She denies pelvic pain, pelvic pressure or changes in her urinary function. She has post quite so vaginal bleeding on 2 occasions she denies vaginal discharge reports vaginal bleeding 2  weeks ago, with intermittent spotting since. Musculoskeletal:  No changes in gait or joint pain Otherwise uninformative 10 point review of system    Physical Exam: Blood pressure 128/80, pulse 68, temperature 97.8 F (36.6 C), resp. rate 16, height 5' 3.23" (1.606 m), weight 188 lb 3.2 oz (85.367 kg). General: Well dressed, well nourished in no apparent distress.   Lungs:  Clear to auscultation bilaterally.  No wheezes. Cardiovascular:  Regular rate and rhythm.  No murmurs or rubs. Abdomen:  Soft, nontender, nondistended.  No palpable masses.  No hepatosplenomegaly.  No ascites. Normal bowel sounds.  No hernias.  Incision is clean dry and intact without any evidence of erythema or hernia   Genitourinary: Normal EGBUS  Vaginal cuff intact. Friable graulation tissue at the vaginal apex.  Removed and sent to pathology.  23mm lesion on left distal vaginal sidewall 2cm from introitus.  Removed    No cul de sac fullness. Rectal:  Good tone no rectovaginal septum nodularity no pelvic masses  Back: No CVA tenderness  LN:  No cervical supraclavicular or inguinal adenopathy  Extremities: No cyanosis, clubbing or edema.  No calf tenderness or erythema Musculoskeletal: No pain, normal strength and range of motion.  [name]  MD., PhD

## 2012-08-15 ENCOUNTER — Encounter: Payer: Self-pay | Admitting: Gynecologic Oncology

## 2012-08-15 ENCOUNTER — Ambulatory Visit: Payer: BC Managed Care – PPO | Attending: Gynecologic Oncology | Admitting: Gynecologic Oncology

## 2012-08-15 VITALS — BP 112/68 | HR 88 | Temp 98.0°F | Resp 16 | Ht 63.23 in | Wt 192.3 lb

## 2012-08-15 DIAGNOSIS — C9000 Multiple myeloma not having achieved remission: Secondary | ICD-10-CM | POA: Insufficient documentation

## 2012-08-15 DIAGNOSIS — C7982 Secondary malignant neoplasm of genital organs: Secondary | ICD-10-CM | POA: Insufficient documentation

## 2012-08-15 DIAGNOSIS — C541 Malignant neoplasm of endometrium: Secondary | ICD-10-CM

## 2012-08-15 DIAGNOSIS — Z9079 Acquired absence of other genital organ(s): Secondary | ICD-10-CM | POA: Insufficient documentation

## 2012-08-15 DIAGNOSIS — C549 Malignant neoplasm of corpus uteri, unspecified: Secondary | ICD-10-CM | POA: Insufficient documentation

## 2012-08-15 DIAGNOSIS — Z90710 Acquired absence of both cervix and uterus: Secondary | ICD-10-CM | POA: Insufficient documentation

## 2012-08-15 NOTE — Patient Instructions (Addendum)
F/U with Dr. Sondra Come 7/31 F/U with GYN ONc 10/2012

## 2012-08-15 NOTE — Progress Notes (Signed)
Office Visit:  GYN ONCOLOGY   CC:   Endometrial cancer surveillance   Assessment:   62 y.o.  year old with Stage1A Grade 2 endometrioid endometrial cancer with recurrence at the distal vagina identified in April 2014.  Subsequent PET scan was negative for evidence of metastatic disease and Emily Greer has completed vaginal  brachytherapy.     Patient's been advised to followup in October Follow-up with Dr. Sondra Come August 29, 2012      HPI:  Emily Greer is a 61 y.o. year old G3P2 initially seen in consultation on 10/04/2001  grade 1  endometrial cancer  She then underwent a  total abdominal hysterectomy bilateral salpingo-oophorectomy bilateral pelvic lymph node dissection on  [id number] without complications.  Her postoperative course was  uncomplicated.  Her final pathologic diagnosis is a Stage  1A Grade  2 endometrioid endometrial cancer with  negative lymphovascular space invasion,  2/20 (10%) of myometrial invasion and negative lymph nodes.  On 1/14 visit she reported  post coital vaginal bleeding  that was self limiting. Vaginal biopsy - LARGELY DENUDED SQUAMOUS EPITHELIUM WITH ASSOCIATED SPONGIOSIS AND CHRONIC INFLAMMATION.- NO DYSPLASIA OR MALIGNANCY  On the visit 04/2012 she c/o vaginal bleeding.  Biopsies were c/w granulation tissue at the vaginal cuff.  Metastatic disease at the distal vagina  PET 06/05/2012 IMPRESSION:  1. There are no specific features identified to suggest  hypermetabolic metastasis from endometrial carcinoma.  2. Skeletal changes of multiple myeloma with focal area of increased uptake    Treated with vaginal brachytherapy June 4, June 11, June 19, June 25, June 30  Site/dose: Vagina, 30.5 Gy in 5 fractions (6 Gy, 6 Gy, 6 Gy, 6 Gy, 6.5 Gy)    Past Medical History  Diagnosis Date  . Lambda light chain myeloma 11/11/2007  . Hypertension 11/28/97  . H/O multiple myeloma 11/28/1997  . Staphylococcus aureus bacteremia 11/28/1997  . Staphylococcus  epidermidis bacteremia 11/28/97  . Pregnancy induced hypertension   . Osteoporosis 12/18/05    Increased  risk   . Increased BMI 12/18/05  . Post-menopausal bleeding 12/19/06  . Vaginal atrophy 12/19/06  . Elevated hemoglobin A1c     Borderline  . Fibroid 07/26/10    asymptomatic  . Vaginal atrophy   . PONV (postoperative nausea and vomiting)     nausea in past, none recent  . Endometrial carcinoma 05/28/12   Colonoscopy scheduled for 02/29/2012 Mammogram 2013 wnl per patient  Past Surgical History  Procedure Laterality Date  . Tubal ligation  1986  . Ectopic pregnancy surgery  1992  . Hysteroscopy w/d&c  09/27/2011    Procedure: DILATATION AND CURETTAGE /HYSTEROSCOPY;  Surgeon: Eldred Manges, MD;  Location: [facility];  Service: Gynecology;;  . Laparotomy  10/31/2011    Procedure: EXPLORATORY LAPAROTOMY;  Surgeon: [name], MD PHD;  Location: [facility];  Service: Gynecology;  Laterality: N/A;  . Abdominal hysterectomy  10/31/2011    Procedure: HYSTERECTOMY ABDOMINAL;  Surgeon: [name], MD PHD;  Location: [facility];  Service: Gynecology;  Laterality: N/A;  . Salpingoophorectomy  10/31/2011    Procedure: SALPINGO OOPHERECTOMY;  Surgeon: [name], MD PHD;  Location: [facility];  Service: Gynecology;  Laterality: Bilateral;   Lymph Nodes sampling   Social History:  Accompanied by her husband.  Stable work envirnment  Review of systems: Constitutional:  She has no fever or chills. No changes in weight.  Reports fatigue Cardiovascular: No chest pain, palpitations or edema. Respiratory:  No shortness of breath,  wheezing or cough Gastrointestinal: She has normal bowel movements without diarrhea or constipation. She denies any nausea or vomiting. Genitourinary:  She denies pelvic pain, pelvic pressure or changes in her urinary function. Denies vaginal bleeding . Musculoskeletal:  No changes in gait or joint pain Otherwise uninformative 10 point review of system    Physical  Exam: Blood pressure 112/68, pulse 88, temperature 98 F (36.7 C), temperature source Oral, resp. rate 16, height 5' 3.23" (1.606 m), weight 192 lb 4.8 oz (87.227 kg). General: Well dressed, well nourished in no apparent distress.   Lungs:  Clear to auscultation bilaterally.  No wheezes. Cardiovascular:  Regular rate and rhythm.  No murmurs or rubs. Abdomen:  Soft, nontender, nondistended.  No palpable masses.  No hepatosplenomegaly.  No ascites. Normal bowel sounds.  No hernias.  Incision is clean dry and intact without any evidence of erythema or hernia   Genitourinary: Normal EGBUS  Vaginal cuff intact. No masses in the vagina. No cul de sac fullness. Back: No CVA tenderness  LN:  No cervical supraclavicular or inguinal adenopathy  Extremities: No cyanosis, clubbing or edema.  No calf tenderness or erythema Musculoskeletal: No pain, normal strength and range of motion.  [name]  MD., PhD

## 2012-08-19 ENCOUNTER — Other Ambulatory Visit: Payer: Self-pay | Admitting: *Deleted

## 2012-08-19 DIAGNOSIS — C9000 Multiple myeloma not having achieved remission: Secondary | ICD-10-CM

## 2012-08-19 MED ORDER — LENALIDOMIDE 15 MG PO CAPS: 15.0000 mg | ORAL_CAPSULE | Freq: Every day | ORAL | Status: DC

## 2012-08-23 ENCOUNTER — Other Ambulatory Visit: Payer: Self-pay | Admitting: *Deleted

## 2012-08-23 ENCOUNTER — Ambulatory Visit (HOSPITAL_BASED_OUTPATIENT_CLINIC_OR_DEPARTMENT_OTHER): Payer: BC Managed Care – PPO | Admitting: Hematology & Oncology

## 2012-08-23 ENCOUNTER — Encounter: Payer: Self-pay | Admitting: *Deleted

## 2012-08-23 ENCOUNTER — Ambulatory Visit (HOSPITAL_BASED_OUTPATIENT_CLINIC_OR_DEPARTMENT_OTHER): Payer: BC Managed Care – PPO

## 2012-08-23 VITALS — BP 118/61 | HR 80 | Temp 98.3°F | Resp 16 | Ht 63.0 in | Wt 192.0 lb

## 2012-08-23 DIAGNOSIS — C9000 Multiple myeloma not having achieved remission: Secondary | ICD-10-CM

## 2012-08-23 DIAGNOSIS — Z8579 Personal history of other malignant neoplasms of lymphoid, hematopoietic and related tissues: Secondary | ICD-10-CM

## 2012-08-23 MED ORDER — POMALIDOMIDE 4 MG PO CAPS: 4.0000 mg | ORAL_CAPSULE | Freq: Every day | ORAL | Status: DC

## 2012-08-23 MED ORDER — SODIUM CHLORIDE 0.9 % IV SOLN
90.0000 mg | Freq: Once | INTRAVENOUS | Status: AC
  Administered 2012-08-23: 90 mg via INTRAVENOUS
  Filled 2012-08-23: qty 10

## 2012-08-23 MED ORDER — CLARITHROMYCIN 500 MG PO TABS: 500.0000 mg | ORAL_TABLET | Freq: Two times a day (BID) | ORAL | Status: DC

## 2012-08-23 MED ORDER — DEXAMETHASONE 4 MG PO TABS: ORAL_TABLET | ORAL | Status: DC

## 2012-08-23 NOTE — Patient Instructions (Addendum)
Pomalidomide oral capsules What is this medicine? POMALIDOMIDE (pom a LID oh mide) is a chemotherapy drug used to treat multiple myeloma. It targets specific proteins within cancer cells and stops the cancer cell from growing. This medicine may be used for other purposes; ask your health care provider or pharmacist if you have questions. What should I tell my health care provider before I take this medicine? They need to know if you have any of these conditions: -history of blood clots -irregular monthly periods or menstrual cycles -an unusual or allergic reaction to pomalidomide, other medicines, foods, dyes, or preservatives -pregnant or trying to get pregnant -breast-feeding How should I use this medicine? Take this medicine by mouth with a glass of water. Follow the directions on the prescription label. Take this medicine on an empty stomach, at least 2 hours before or 2 hours after a meal. Do not take with food. Do not cut, crush, or chew this medicine. Take your medicine at regular intervals. Do not take it more often than directed. Do not stop taking except on your doctor's advice. A special MedGuide will be given to you by the pharmacist with each prescription and refill. Be sure to read this information carefully each time. Talk to your pediatrician regarding the use of this medicine in children. Special care may be needed. Overdosage: If you think you've taken too much of this medicine contact a poison control center or emergency room at once. Overdosage: If you think you have taken too much of this medicine contact a poison control center or emergency room at once. NOTE: This medicine is only for you. Do not share this medicine with others. What if I miss a dose? If you miss a dose, take it as soon as you can. If your next dose is to be taken in less than 12 hours, then do not take the missed dose. Take the next dose at your regular time. Do not take double or extra doses. What may  interact with this medicine? This medicine may interact with the following medications: -amprenavir -boceprevir -carbamazepine -dalfopristin; quinupristin -delavirdine -enzalutamide -fosamprenavir -indinavir -isoniazid, INH -itraconazole -ketoconazole -nicardipine -rifampin -ritonavir -St. John's Wort, Hypericum perforatum -telaprevir -telithromycin -thiabendazole -tipranavir -tobacco (cigarettes) This list may not describe all possible interactions. Give your health care provider a list of all the medicines, herbs, non-prescription drugs, or dietary supplements you use. Also tell them if you smoke, drink alcohol, or use illegal drugs. Some items may interact with your medicine. What should I watch for while using this medicine? Visit your doctor for regular check ups. Tell your doctor or healthcare professional if your symptoms do not start to get better or if they get worse. You will need to have important blood work done while you are taking this medicine. This medicine is available only through a special program. Doctors, pharmacies, and patients must meet all of the conditions of the program. Your health care provider will help you get signed up with the program if you need this medicine. Through the program you will only receive up to a 28 day supply of the medicine at one time. You will need a new prescription for each refill. This medicine can cause birth defects. Do not get pregnant while taking this drug. Females with child-bearing potential will need to have 2 negative pregnancy tests before starting this medicine. Pregnancy testing must be done every 2 to 4 weeks as directed while taking this medicine. Use 2 reliable forms of birth control together while  you are taking this medicine and for 1 month after you stop taking this medicine. If you think that you might be pregnant talk to your doctor right away. Men must use a latex condom during sexual contact with a woman while  taking this medicine and for 28 days after you stop taking this medicine. A latex condom is needed even if you have had a vasectomy. Contact your doctor right away if your partner becomes pregnant. Do not donate sperm while taking this medicine and for 28 days after you stop taking this medicine. Do not give blood while taking the medicine and for 1 month after completion of treatment to avoid exposing pregnant women to the medicine through the donated blood. You may need blood work done while you are taking this medicine. If you are going to have surgery or any other procedures, tell your doctor you are taking this medicine. What side effects may I notice from receiving this medicine? Side effects that you should report to your doctor or health care professional as soon as possible: -allergic reactions like skin rash, itching or hives, swelling of the face, lips, or tongue -bloody or dark, tarry stools -breathing problems -chest pain -confusion -dark urine -fever, infection, runny nose, or sore throat -nausea, vomiting -pain, tingling, numbness in the hands or feet -red spots on the skin -swelling of your hands, ankles or leg -trouble passing urine or change in the amount of urine -unusual bleeding or bruising  Side effects that usually do not require medical attention (Report these to your doctor or health care professional if they continue or are bothersome.): -constipation -diarrhea -dizziness -headache -joint pain -muscle pain -tiredness -trouble sleeping This list may not describe all possible side effects. Call your doctor for medical advice about side effects. You may report side effects to FDA at 1-800-FDA-1088. Where should I keep my medicine? Keep out of the reach of children. Store between 20 and 25 degrees C (68 and 77 degrees F). Throw away any unused medicine after the expiration date. NOTE: This sheet is a summary. It may not cover all possible information. If you have  questions about this medicine, talk to your doctor, pharmacist, or health care provider.  2013, Elsevier/Gold Standard. (05/04/2011 5:58:16 AM)   Pamidronate injection What is this medicine? PAMIDRONATE (pa mi DROE nate) slows calcium loss from bones. It is used to treat high calcium blood levels from cancer or Paget's disease. It is also used to treat bone pain and prevent fractures from certain cancers that have spread to the bone. This medicine may be used for other purposes; ask your health care provider or pharmacist if you have questions. What should I tell my health care provider before I take this medicine? They need to know if you have any of these conditions: -aspirin-sensitive asthma -dental disease -kidney disease -an unusual or allergic reaction to pamidronate, other medicines, foods, dyes, or preservatives -pregnant or trying to get pregnant -breast-feeding How should I use this medicine? This medicine is for infusion into a vein. It is given by a health care professional in a hospital or clinic setting. Talk to your pediatrician regarding the use of this medicine in children. This medicine is not approved for use in children. Overdosage: If you think you have taken too much of this medicine contact a poison control center or emergency room at once. NOTE: This medicine is only for you. Do not share this medicine with others. What if I miss a dose? This does  not apply. What may interact with this medicine? -certain antibiotics given by injection -medicines for inflammation or pain like ibuprofen, naproxen -some diuretics like bumetanide, furosemide -cyclosporine -parathyroid hormone -tacrolimus -teriparatide -thalidomide This list may not describe all possible interactions. Give your health care provider a list of all the medicines, herbs, non-prescription drugs, or dietary supplements you use. Also tell them if you smoke, drink alcohol, or use illegal drugs. Some items  may interact with your medicine. What should I watch for while using this medicine? Visit your doctor or health care professional for regular checkups. It may be some time before you see the benefit from this medicine. Do not stop taking your medicine unless your doctor tells you to. Your doctor may order blood tests or other tests to see how you are doing. Women should inform their doctor if they wish to become pregnant or think they might be pregnant. There is a potential for serious side effects to an unborn child. Talk to your health care professional or pharmacist for more information. You should make sure that you get enough calcium and vitamin D while you are taking this medicine. Discuss the foods you eat and the vitamins you take with your health care professional. Some people who take this medicine have severe bone, joint, and/or muscle pain. This medicine may also increase your risk for a broken thigh bone. Tell your doctor right away if you have pain in your upper leg or groin. Tell your doctor if you have any pain that does not go away or that gets worse. What side effects may I notice from receiving this medicine? Side effects that you should report to your doctor or health care professional as soon as possible: -allergic reactions like skin rash, itching or hives, swelling of the face, lips, or tongue -black or tarry stools -changes in vision -eye inflammation, pain -high blood pressure -jaw pain, especially burning or cramping -muscle weakness -numb, tingling pain -swelling of feet or hands -trouble passing urine or change in the amount of urine -unable to move easily Side effects that usually do not require medical attention (report to your doctor or health care professional if they continue or are bothersome): -bone, joint, or muscle pain -constipation -dizzy, drowsy -fever -headache -loss of appetite -nausea, vomiting -pain at site where injected This list may not  describe all possible side effects. Call your doctor for medical advice about side effects. You may report side effects to FDA at 1-800-FDA-1088. Where should I keep my medicine? This drug is given in a hospital or clinic and will not be stored at home. NOTE: This sheet is a summary. It may not cover all possible information. If you have questions about this medicine, talk to your doctor, pharmacist, or health care provider.  2013, Elsevier/Gold Standard. (07/15/2010 8:49:49 AM)

## 2012-08-23 NOTE — Progress Notes (Signed)
This office note has been dictated.

## 2012-08-23 NOTE — Progress Notes (Signed)
After talking with [employer], received confirmation that patient has been deactivated from the Revlimid Program.  Patient being enrolled in [facility] program

## 2012-08-24 NOTE — Progress Notes (Signed)
CC:   Emily Greer, M.D. Emily Greer, M.D. Emily Lewandowsky, MD  DIAGNOSES: 1. Lambda light chain myeloma - likely progressive. 2. History of stage I uterine adenocarcinoma.  CURRENT THERAPY: 1. Patient to start pomalidomide 4 mg p.o. daily x21 days along with     Biaxin and Decadron. 2. Aredia 90 mg IV q.4 months.  INTERIM HISTORY:  Emily Greer comes in for followup.  She completed radiation therapy to the pelvis for her endometrial cancer.  She tolerated this well.  She received 3000 rad.  She actually had recurrence within the vaginal vault.  We did go ahead and repeat her urine studies.  Her urine showed her lambda light chain to be 1240 mg per day. Previously, it was 341 mg per day.  Her serum lambda light chain was up to 22.5 mg/dL.  She has not had any kind of bony pain.  She has had no fatigue.  She has had no nausea or vomiting.  She has had no leg swelling.  She is worried about her weight going up.  She is trying to lose weight.  PHYSICAL EXAM:  General:  This is a well-developed, well-nourished African American female in no obvious distress.  Vital Signs:  Show a temperature of 98.3, pulse 80, respiratory 16, blood pressure 118/61. Weight is 192.  Head and Neck:  Show a normocephalic, atraumatic skull. There are no ocular or oral lesions.  There are no palpable cervical or supraclavicular lymph nodes.  Lungs:  Clear bilaterally.  Cardiac: Regular rate and rhythm with a normal S1, S2.  There are no murmurs, rubs, or bruits.  Abdomen:  Soft.  She has good bowel sounds.  She has a laparoscopy scar from the hysterectomy.  This is well healed. Extremities:  Show no clubbing, cyanosis, or edema.  Back:  No tenderness over the spine, ribs, or hips.  LABORATORY STUDIES:  White cell count of 3.2, hemoglobin 11, hematocrit 33, platelet count 234.  Sodium 140, potassium 3.5, BUN 17, creatinine 1.1.  Calcium 10.3 with an albumin of 3.8.  IMPRESSION:  [name] is a 62 year old African female with recurrent lambda light chain myeloma.  She did undergo stem cell transplantation probably close to, I think, 14 years ago.  This was at [facility].  I believe that we are probably going to have to consider her for transplant again.  However, for now, I am going to put her on Pomalyst.  I think this would be an incredibly reasonable way to try to treat her right now.  We will certainly be able to see how she responds by her light chains.  I will add the Biaxin and Decadron as I feel these will help enhance the effects of the pomalidomide.  She still gets her Aredia.  I probably will set her up with some bone survey x-rays to see how her bones look.  I do not think a bone marrow biopsy is mandatory right now.  If we can get her back into "remission," then we will see about getting her out to [facility] for a stem cell transplant.  I want to see her back in about 6 weeks.    ______________________________ [name], M.D. PRE/MEDQ  D:  08/23/2012  T:  08/24/2012  Job:  [id number]

## 2012-08-26 ENCOUNTER — Encounter: Payer: Self-pay | Admitting: Radiation Oncology

## 2012-08-26 ENCOUNTER — Telehealth: Payer: Self-pay | Admitting: Hematology & Oncology

## 2012-08-26 DIAGNOSIS — Z923 Personal history of irradiation: Secondary | ICD-10-CM | POA: Insufficient documentation

## 2012-08-26 NOTE — Telephone Encounter (Signed)
Left pt message with 9-5 appointment time for downstairs at 130 pm and to call for details

## 2012-08-29 ENCOUNTER — Ambulatory Visit
Admission: RE | Admit: 2012-08-29 | Discharge: 2012-08-29 | Disposition: A | Discharge status: Home / Self Care | Payer: BC Managed Care – PPO | Source: Ambulatory Visit | Attending: Radiation Oncology | Admitting: Radiation Oncology

## 2012-08-29 ENCOUNTER — Encounter: Payer: Self-pay | Admitting: Radiation Oncology

## 2012-08-29 VITALS — BP 127/70 | HR 76 | Temp 98.2°F | Ht 63.0 in | Wt 191.7 lb

## 2012-08-29 DIAGNOSIS — C541 Malignant neoplasm of endometrium: Secondary | ICD-10-CM

## 2012-08-29 NOTE — Progress Notes (Addendum)
Emily Greer here for follow up after treatment to her vaginal mucosa.  She denies pain.  She does have fatigue.  She has noticed spotting after intercourse.  She is going to start taking pomalyst tomorrow for multiple myeloma.  She has occasional diarrhea from her medications. Gave dialator with 2 tubes surgilube with instructions of use at bedtimes on Mon,WED,& Fri nights for 10-15 min, all questions answerd

## 2012-08-29 NOTE — Progress Notes (Signed)
Radiation Oncology         [phone]) [phone] ________________________________  Name: Emily Greer MRN: [id number]  Date: 08/29/2012  DOB: 1950-04-01  Follow-Up Visit Note  CC: Foye Spurling, MD  [name], MD  Diagnosis:   Recurrent endometrial cancer, limited to the vagina  Interval Since Last Radiation:  1  months  Narrative:  The patient returns today for routine follow-up.  She is doing well at this time. She denies any urinary or bowel complaints.. She did notice some mild spotting after intercourse but no significant vaginal bleeding. She denies any hematuria or rectal bleeding.                              ALLERGIES:  is allergic to codeine.  Meds: Current Outpatient Prescriptions  Medication Sig Dispense Refill  . aspirin 325 MG tablet Take 325 mg by mouth daily.      . calcium carbonate (OS-CAL) 600 MG TABS Take 600 mg by mouth 2 (two) times daily with a meal.       . Ergocalciferol (VITAMIN D2) 2000 UNITS TABS Take by mouth daily.      [name] Kitchen ibuprofen (ADVIL,MOTRIN) 200 MG tablet Take 600 mg by mouth every 6 (six) hours as needed. For pain      . loperamide (IMODIUM) 2 MG capsule Take 2 mg by mouth as needed. For diarrhea      . loratadine (CLARITIN) 10 MG tablet Take 10 mg by mouth every morning. For allergies during allergy season      . metoprolol (TOPROL-XL) 50 MG 24 hr tablet Take 50 mg by mouth every morning.       . Olmesartan-Amlodipine-HCTZ 20-5-12.5 MG TABS Take 1 tablet by mouth daily.      [name] Kitchen dexamethasone (DECADRON) 4 MG tablet Take 5 pills, WITH FOOD, once a week.  60 tablet  3  . lenalidomide (REVLIMID) 15 MG capsule Take 1 capsule (15 mg total) by mouth daily. Auth # [id number]  21 capsule  0  . pomalidomide (POMALYST) 4 MG capsule Take 1 capsule (4 mg total) by mouth daily. Take with water on days 1-21. Repeat every 28 days. Authorization # [id number]  21 capsule  0  . Probiotic Product (ALIGN PO) Take 1 capsule by mouth daily.       No current  facility-administered medications for this encounter.    Physical Findings: The patient is in no acute distress. Patient is alert and oriented.  height is 5\' 3"  (1.6 m) and weight is 191 lb 11.2 oz (86.955 kg). Her temperature is 98.2 F (36.8 C). Her blood pressure is 127/70 and her pulse is 76. [name] Kitchen  No palpable supraclavicular adenopathy. The lungs are clear to auscultation. The heart has a regular rhythm and rate. The abdomen is soft and nontender with normal bowel sounds. A Pap smear is not performed today in light of her recent completion of treatment.  Lab Findings: Lab Results  Component Value Date   WBC 3.2* 07/31/2012   HGB 11.0* 07/31/2012   HCT 33.0* 07/31/2012   MCV 79* 07/31/2012   PLT 234 07/31/2012         Impression:  The patient is recovering from the effects of radiation.    Plan:  Return for followup in January for pelvic exam and Pap smear.  The patient will be seen by gynecologic oncology in October for her first Pap smear status post treatment.  Today the  patient was given a vaginal dilator and instructions on its use in light of her vaginal vault radiation therapy.  _____________________________________ -----------------------------------  Blair Promise, PhD, MD

## 2012-08-29 NOTE — Addendum Note (Signed)
Encounter addended by: Rebecca Eaton, RN on: 08/29/2012 11:20 AM<BR>     Documentation filed: Notes Section

## 2012-09-11 ENCOUNTER — Other Ambulatory Visit: Payer: Self-pay | Admitting: *Deleted

## 2012-09-23 ENCOUNTER — Other Ambulatory Visit: Payer: Self-pay | Admitting: *Deleted

## 2012-09-23 DIAGNOSIS — C9000 Multiple myeloma not having achieved remission: Secondary | ICD-10-CM

## 2012-09-23 MED ORDER — POMALIDOMIDE 4 MG PO CAPS: 4.0000 mg | ORAL_CAPSULE | Freq: Every day | ORAL | Status: DC

## 2012-09-23 NOTE — Telephone Encounter (Signed)
Received a refill request from [facility] for Revlimid. She is taking this 21 days on 7 days off. Obtained auth # [id number]. Faxed it back to [phone] once signed by Dr Marin Olp.

## 2012-10-04 ENCOUNTER — Ambulatory Visit (HOSPITAL_BASED_OUTPATIENT_CLINIC_OR_DEPARTMENT_OTHER): Payer: BC Managed Care – PPO | Admitting: Hematology & Oncology

## 2012-10-04 ENCOUNTER — Other Ambulatory Visit (HOSPITAL_BASED_OUTPATIENT_CLINIC_OR_DEPARTMENT_OTHER): Payer: BC Managed Care – PPO | Admitting: Lab

## 2012-10-04 ENCOUNTER — Ambulatory Visit (HOSPITAL_BASED_OUTPATIENT_CLINIC_OR_DEPARTMENT_OTHER)
Admission: RE | Admit: 2012-10-04 | Discharge: 2012-10-04 | Disposition: A | Discharge status: Home / Self Care | Payer: BC Managed Care – PPO | Source: Ambulatory Visit | Attending: Hematology & Oncology | Admitting: Hematology & Oncology

## 2012-10-04 VITALS — BP 123/67 | HR 79 | Temp 98.0°F | Resp 16 | Ht 64.0 in | Wt 195.0 lb

## 2012-10-04 DIAGNOSIS — C9000 Multiple myeloma not having achieved remission: Secondary | ICD-10-CM

## 2012-10-04 DIAGNOSIS — X58XXXA Exposure to other specified factors, initial encounter: Secondary | ICD-10-CM | POA: Insufficient documentation

## 2012-10-04 DIAGNOSIS — S22009A Unspecified fracture of unspecified thoracic vertebra, initial encounter for closed fracture: Secondary | ICD-10-CM | POA: Insufficient documentation

## 2012-10-04 LAB — CMP (CANCER CENTER ONLY)
ALT(SGPT): 24 U/L (ref 10–47)
AST: 15 U/L (ref 11–38)
Albumin: 3.9 g/dL (ref 3.3–5.5)
Alkaline Phosphatase: 56 U/L (ref 26–84)
BUN, Bld: 14 mg/dL (ref 7–22)
CO2: 29 mEq/L (ref 18–33)
Calcium: 9.6 mg/dL (ref 8.0–10.3)
Chloride: 99 mEq/L (ref 98–108)
Creat: 0.9 mg/dl (ref 0.6–1.2)
Glucose, Bld: 88 mg/dL (ref 73–118)
Potassium: 3.8 mEq/L (ref 3.3–4.7)
Sodium: 136 mEq/L (ref 128–145)
Total Bilirubin: 0.9 mg/dl (ref 0.20–1.60)
Total Protein: 6.6 g/dL (ref 6.4–8.1)

## 2012-10-04 LAB — CBC WITH DIFFERENTIAL (CANCER CENTER ONLY)
BASO#: 0.1 10*3/uL (ref 0.0–0.2)
BASO%: 2.9 % — ABNORMAL HIGH (ref 0.0–2.0)
EOS%: 4.4 % (ref 0.0–7.0)
Eosinophils Absolute: 0.2 10*3/uL (ref 0.0–0.5)
HCT: 32.9 % — ABNORMAL LOW (ref 34.8–46.6)
HGB: 10.9 g/dL — ABNORMAL LOW (ref 11.6–15.9)
LYMPH#: 0.9 10*3/uL (ref 0.9–3.3)
LYMPH%: 19.9 % (ref 14.0–48.0)
MCH: 27 pg (ref 26.0–34.0)
MCHC: 33.1 g/dL (ref 32.0–36.0)
MCV: 82 fL (ref 81–101)
MONO#: 0.3 10*3/uL (ref 0.1–0.9)
MONO%: 6.6 % (ref 0.0–13.0)
NEUT#: 3 10*3/uL (ref 1.5–6.5)
NEUT%: 66.2 % (ref 39.6–80.0)
Platelets: 265 10*3/uL (ref 145–400)
RBC: 4.03 10*6/uL (ref 3.70–5.32)
RDW: 18.8 % — ABNORMAL HIGH (ref 11.1–15.7)
WBC: 4.5 10*3/uL (ref 3.9–10.0)

## 2012-10-04 LAB — TECHNOLOGIST REVIEW CHCC SATELLITE

## 2012-10-04 NOTE — Progress Notes (Signed)
This office note has been dictated.

## 2012-10-05 NOTE — Progress Notes (Signed)
CC:   Jeanann Lewandowsky, MD Jeanann Lewandowsky, M.D. Eldred Manges, M.D.  DIAGNOSES: 1. Recurrent lambda light chain myeloma. 2. History of stage I uterine adenocarcinoma.  CURRENT THERAPY: 1. Patient status post 1 cycle of pomalidomide/Biaxin/Decadron. 2. Aredia 90 mg IV q.4 months.  INTERIM HISTORY:  Ms. [name] comes in for followup.  She did pretty well with the first cycle of chemotherapy. The problem that she has was with the Decadron.  She just cannot take this weekly.  As such, I told her to take it 2 weeks on and 1 week off.  I forgot to mention that she is on aspirin for any kind of thromboembolic complications.  She is doing well otherwise.  When we saw her back in early July, her lambda light chain in her serum was 22.5 mg/dL.  This is what we are following.  She has had no problems with bowels or bladder.  She has had no diarrhea.  She did have some leg swelling.  This improved once we cut her Decadron dose back a little bit.  She has had no cough.  She has had no mouth sores.  There has been no headache.  Overall, her performance status is ECOG 1.  PHYSICAL EXAMINATION:  General:  This is a well-developed, well- nourished African American female in no obvious distress.  Vital signs: Temperature of 98, pulse 79, respiratory rate 16, blood pressure 123/67. Weight is 195.  Head and neck examination:  Normocephalic, atraumatic skull.  There are no ocular or oral lesions.  There are no palpable cervical or supraclavicular lymph nodes.  Lungs:  Clear bilaterally. Cardiac examination:  Regular rate and rhythm with a normal S1 and S2. There are no murmurs, rubs or bruits.  Abdomen:  Soft.  She has good bowel sounds.  There is no fluid wave.  There is no palpable hepatosplenomegaly.  Extremities:  Show some trace edema lower legs. She has good range of motion of her joints.  Skin examination:  No rashes, ecchymoses or petechia.  Neurological examination:  No  focal neurological deficits.  LABORATORY STUDIES:  White cell count is 4.5, hemoglobin 10.9, hematocrit 32.9, platelet count 265.  IMPRESSION:  Emily Greer is a 62 year old African female with recurrent lambda light chain myeloma.  She actually is doing okay from my point of view. We will plan to get her back in 1 month.  She will start her second cycle of pomalidomide next week.  I think that we can follow the light chains in her serum to assess for response.  We may consider urine evaluation as she does have a fairly accurate 24 urine collection.  I will plan to see [name] back in another month.    ______________________________ [name], M.D. PRE/MEDQ  D:  10/04/2012  T:  10/05/2012  Job:  [id number]

## 2012-10-07 LAB — KAPPA/LAMBDA LIGHT CHAINS
Kappa free light chain: 0.22 mg/dL — ABNORMAL LOW (ref 0.33–1.94)
Kappa:Lambda Ratio: 0.03 — ABNORMAL LOW (ref 0.26–1.65)
Lambda Free Lght Chn: 7.21 mg/dL — ABNORMAL HIGH (ref 0.57–2.63)

## 2012-10-18 ENCOUNTER — Other Ambulatory Visit: Payer: Self-pay | Admitting: *Deleted

## 2012-10-18 MED ORDER — POMALIDOMIDE 4 MG PO CAPS: 4.0000 mg | ORAL_CAPSULE | Freq: Every day | ORAL | Status: DC

## 2012-11-05 ENCOUNTER — Encounter: Payer: Self-pay | Admitting: Gynecologic Oncology

## 2012-11-05 ENCOUNTER — Ambulatory Visit: Payer: BC Managed Care – PPO | Attending: Gynecologic Oncology | Admitting: Gynecologic Oncology

## 2012-11-05 VITALS — BP 145/76 | HR 92 | Temp 98.8°F | Resp 16 | Ht 63.23 in | Wt 197.0 lb

## 2012-11-05 DIAGNOSIS — Z90710 Acquired absence of both cervix and uterus: Secondary | ICD-10-CM | POA: Insufficient documentation

## 2012-11-05 DIAGNOSIS — C549 Malignant neoplasm of corpus uteri, unspecified: Secondary | ICD-10-CM | POA: Insufficient documentation

## 2012-11-05 DIAGNOSIS — C9000 Multiple myeloma not having achieved remission: Secondary | ICD-10-CM | POA: Insufficient documentation

## 2012-11-05 DIAGNOSIS — C7982 Secondary malignant neoplasm of genital organs: Secondary | ICD-10-CM | POA: Insufficient documentation

## 2012-11-05 DIAGNOSIS — C541 Malignant neoplasm of endometrium: Secondary | ICD-10-CM

## 2012-11-05 DIAGNOSIS — Z9079 Acquired absence of other genital organ(s): Secondary | ICD-10-CM | POA: Insufficient documentation

## 2012-11-05 NOTE — Progress Notes (Signed)
Office Visit:  GYN ONCOLOGY   CC:   Endometrial cancer surveillance   Assessment:   62 y.o.  year old with Stage1A Grade 2 endometrioid endometrial cancer with recurrence at the distal vagina identified in April 2014.  Subsequent PET scan was negative for evidence of metastatic disease and Emily Greer  completed vaginal  brachytherapy 07/29/2012  Patient's been advised to follow-up with Dr. Sondra Come January 2015 Followup with Dr. Kendall Flack April 2015 Followup with GYN oncology July 2050     HPI:  Emily Greer is a 62 y.o. year old G3P2 initially seen in consultation on 10/04/2001  grade 1  endometrial cancer  She then underwent a  total abdominal hysterectomy bilateral salpingo-oophorectomy bilateral pelvic lymph node dissection on  [id number] without complications.  Her postoperative course was  uncomplicated.  Her final pathologic diagnosis is a Stage  1A Grade  2 endometrioid endometrial cancer with  negative lymphovascular space invasion,  2/20 (10%) of myometrial invasion and negative lymph nodes.  On 1/14 visit she reported  post coital vaginal bleeding  that was self limiting. Vaginal biopsy - LARGELY DENUDED SQUAMOUS EPITHELIUM WITH ASSOCIATED SPONGIOSIS AND CHRONIC INFLAMMATION.- NO DYSPLASIA OR MALIGNANCY  On the visit 04/2012 she c/o vaginal bleeding.  Biopsies were c/w granulation tissue at the vaginal cuff.  Metastatic disease at the distal vagina  PET 06/05/2012 IMPRESSION:  1. There are no specific features identified to suggest  hypermetabolic metastasis from endometrial carcinoma.  2. Skeletal changes of multiple myeloma with focal area of increased uptake   Treated with vaginal brachytherapy June 4, June 11, June 19, June 25, June 30  Site/dose: Vagina, 30.5 Gy in 5 fractions (6 Gy, 6 Gy, 6 Gy, 6 Gy, 6.5 Gy)  As [place] has done very well since. She denies any nausea vomiting she reports post coital spotting but no intermittent vaginal bleeding.  Past  Medical History  Diagnosis Date  . Lambda light chain myeloma 11/11/2007  . Hypertension 11/28/97  . H/O multiple myeloma 11/28/1997  . Staphylococcus aureus bacteremia 11/28/1997  . Staphylococcus epidermidis bacteremia 11/28/97  . Pregnancy induced hypertension   . Osteoporosis 12/18/05    Increased  risk   . Increased BMI 12/18/05  . Post-menopausal bleeding 12/19/06  . Vaginal atrophy 12/19/06  . Elevated hemoglobin A1c     Borderline  . Fibroid 07/26/10    asymptomatic  . Vaginal atrophy   . PONV (postoperative nausea and vomiting)     nausea in past, none recent  . Endometrial carcinoma 05/28/12  . History of radiation therapy 6/4, 6/11, 6/19, 6/25, 07/29/2012    vagina 30.5 gray in 5 fx, HDR brachytherapy   Colonoscopy scheduled for 02/29/2012 Mammogram 2013 wnl per patient, mammogram for 2014 has been scheduled  Past Surgical History  Procedure Laterality Date  . Tubal ligation  1986  . Ectopic pregnancy surgery  1992  . Hysteroscopy w/d&c  09/27/2011    Procedure: DILATATION AND CURETTAGE /HYSTEROSCOPY;  Surgeon: Eldred Manges, MD;  Location: [facility];  Service: Gynecology;;  . Laparotomy  10/31/2011    Procedure: EXPLORATORY LAPAROTOMY;  Surgeon: [name], MD PHD;  Location: [facility];  Service: Gynecology;  Laterality: N/A;  . Abdominal hysterectomy  10/31/2011    Procedure: HYSTERECTOMY ABDOMINAL;  Surgeon: [name], MD PHD;  Location: [facility];  Service: Gynecology;  Laterality: N/A;  . 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  Her husband is well  Review of systems: Constitutional:  She has no fever or chills. No changes in weight.  Cardiovascular: No chest pain, palpitations or edema. Respiratory:  No shortness of breath, wheezing or  cough Gastrointestinal: She has normal bowel movements without diarrhea or constipation. She denies any nausea or vomiting. Reports weight gain with current steroid use. Genitourinary:  She denies pelvic pain, pelvic pressure or changes in her urinary function. Denies vaginal bleeding . Reports post coital spotting Musculoskeletal:  No changes in gait or joint pain Otherwise uninformative 10 point review of system   Physical Exam: Blood pressure 145/76, pulse 92, temperature 98.8 F (37.1 C), temperature source Oral, resp. rate 16, height 5' 3.23" (1.606 m), weight 197 lb (89.359 kg). General: Well dressed, well nourished in no apparent distress.   Lungs:  Clear to auscultation bilaterally.  No wheezes. Cardiovascular:  Regular rate and rhythm.  No murmurs or rubs. Abdomen:  Soft, nontender, nondistended.  No palpable masses.  No hepatosplenomegaly.  No ascites. Normal bowel sounds.  No hernias.  Incision is clean dry and intact without any evidence of masses or hernia   Genitourinary: Normal EGBUS  Vaginal cuff intact. No masses in the vagina. No cul de sac fullness or nodularity. Back: No CVA tenderness  LN:  No cervical supraclavicular or inguinal adenopathy  Extremities: No cyanosis, clubbing or edema.  No calf tenderness or erythema Musculoskeletal: No pain, normal strength and range of motion.  [name]  MD., PhD

## 2012-11-05 NOTE — Patient Instructions (Signed)
F/u with Dr. Sondra Come  01/2013 F/u with Gyn Onc 07/2013 F/U with Dr. Leo Grosser 04/2013  Happy Holidays Happy New Year!

## 2012-11-11 ENCOUNTER — Ambulatory Visit (HOSPITAL_BASED_OUTPATIENT_CLINIC_OR_DEPARTMENT_OTHER): Payer: BC Managed Care – PPO | Admitting: Hematology & Oncology

## 2012-11-11 ENCOUNTER — Other Ambulatory Visit (HOSPITAL_BASED_OUTPATIENT_CLINIC_OR_DEPARTMENT_OTHER): Payer: BC Managed Care – PPO | Admitting: Lab

## 2012-11-11 VITALS — BP 117/60 | HR 58 | Temp 98.0°F | Resp 14 | Ht 63.0 in | Wt 201.0 lb

## 2012-11-11 DIAGNOSIS — C9000 Multiple myeloma not having achieved remission: Secondary | ICD-10-CM

## 2012-11-11 DIAGNOSIS — Z8541 Personal history of malignant neoplasm of cervix uteri: Secondary | ICD-10-CM

## 2012-11-11 LAB — CBC WITH DIFFERENTIAL (CANCER CENTER ONLY)
BASO#: 0 10*3/uL (ref 0.0–0.2)
BASO%: 0.6 % (ref 0.0–2.0)
EOS%: 5.3 % (ref 0.0–7.0)
Eosinophils Absolute: 0.3 10*3/uL (ref 0.0–0.5)
HCT: 30.9 % — ABNORMAL LOW (ref 34.8–46.6)
HGB: 10.5 g/dL — ABNORMAL LOW (ref 11.6–15.9)
LYMPH#: 1.2 10*3/uL (ref 0.9–3.3)
LYMPH%: 19 % (ref 14.0–48.0)
MCH: 27.6 pg (ref 26.0–34.0)
MCHC: 34 g/dL (ref 32.0–36.0)
MCV: 81 fL (ref 81–101)
MONO#: 1.4 10*3/uL — ABNORMAL HIGH (ref 0.1–0.9)
MONO%: 21.5 % — ABNORMAL HIGH (ref 0.0–13.0)
NEUT#: 3.4 10*3/uL (ref 1.5–6.5)
NEUT%: 53.6 % (ref 39.6–80.0)
Platelets: 270 10*3/uL (ref 145–400)
RBC: 3.8 10*6/uL (ref 3.70–5.32)
RDW: 17.6 % — ABNORMAL HIGH (ref 11.1–15.7)
WBC: 6.4 10*3/uL (ref 3.9–10.0)

## 2012-11-11 LAB — CMP (CANCER CENTER ONLY)
ALT(SGPT): 20 U/L (ref 10–47)
AST: 14 U/L (ref 11–38)
Albumin: 3.5 g/dL (ref 3.3–5.5)
Alkaline Phosphatase: 42 U/L (ref 26–84)
BUN, Bld: 17 mg/dL (ref 7–22)
CO2: 31 mEq/L (ref 18–33)
Calcium: 9.5 mg/dL (ref 8.0–10.3)
Chloride: 96 mEq/L — ABNORMAL LOW (ref 98–108)
Creat: 1.1 mg/dl (ref 0.6–1.2)
Glucose, Bld: 90 mg/dL (ref 73–118)
Potassium: 3.1 mEq/L — ABNORMAL LOW (ref 3.3–4.7)
Sodium: 137 mEq/L (ref 128–145)
Total Bilirubin: 0.7 mg/dl (ref 0.20–1.60)
Total Protein: 6.3 g/dL — ABNORMAL LOW (ref 6.4–8.1)

## 2012-11-11 MED ORDER — HYDROCHLOROTHIAZIDE 12.5 MG PO CAPS: 12.5000 mg | ORAL_CAPSULE | Freq: Every day | ORAL | Status: DC

## 2012-11-11 MED ORDER — POTASSIUM CHLORIDE CRYS ER 20 MEQ PO TBCR: EXTENDED_RELEASE_TABLET | ORAL | Status: DC

## 2012-11-11 NOTE — Addendum Note (Signed)
Addended by: Burney Gauze R on: 11/11/2012 05:37 PM   Modules accepted: Orders

## 2012-11-11 NOTE — Progress Notes (Signed)
This office note has been dictated.

## 2012-11-12 LAB — KAPPA/LAMBDA LIGHT CHAINS
Kappa free light chain: 0.9 mg/dL (ref 0.33–1.94)
Kappa:Lambda Ratio: 0.11 — ABNORMAL LOW (ref 0.26–1.65)
Lambda Free Lght Chn: 8.26 mg/dL — ABNORMAL HIGH (ref 0.57–2.63)

## 2012-11-12 NOTE — Progress Notes (Signed)
CC:   Emily Greer, M.D. Emily Lewandowsky, MD Eldred Manges, M.D.  DIAGNOSES: 1. Recurrent lambda light chain myeloma. 2. History of stage I uterine adenocarcinoma.  CURRENT THERAPY: 1. Patient status post 2 cycles of pomalidomide/Biaxin/Decadron. 2. Aredia 90 mg IV q.4 months.  INTERIM HISTORY:  Emily Greer comes in for followup.  She has responded very nicely to chemotherapy.  She had a decrease in her light chains from 22 mg/dL down to 7.21 mg/dL.  This is going quite encouraging.  She has gained some weight.  This might be fluid retention.  I gave her an extra dose of hydrochlorothiazide to take at home.  She is taking hydrochlorothiazide with her blood pressure medication.  This is only 12.5 mg.  I gave her an extra 12.5 mg to take.  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  She has had no cough or shortness of breath.  She is on aspirin.  There is no change in bowel or bladder habits.  She has had no fever, sweats or chills.  There is no nausea or vomiting.  She has had no headache.  PHYSICAL EXAMINATION:  General:  This is a well-developed, well- nourished black female in no obvious distress.  Vital signs: Temperature 98, pulse 58, respiratory rate 14, blood pressure 117/60, weight is 201 pounds.  Head/Neck:  Exam shows a normocephalic, atraumatic skull.  There are no ocular or oral lesions.  There are no palpable cervical or supraclavicular lymph nodes.  Lungs:  Clear to percussion and auscultation bilaterally.  Cardiac:  Regular rate and rhythm with a normal S1 and S2.  There are no murmurs, rubs or bruits. Abdomen:  Soft.  She has good bowel sounds.  There is no fluid wave. There is no palpable abdominal mass.  There is no palpable hepatosplenomegaly.  Extremities:  Show no clubbing, cyanosis or edema. Neurological:  Exam shows no focal neurological deficits.  LABORATORY STUDIES:  White cell count is 6.4,  hemoglobin 10.5, hematocrit 30.9, platelet count 270.  BUN 17, creatinine 1.1.  Calcium 9.5, with an albumin of 3.5.  IMPRESSION:  Emily Greer is a nice 62 year old African American female with recurrent lambda light chain myeloma.  She responded very well to the pomalidomide based therapy.  We will go ahead and plan for her 3rd cycle.  I think the real issue is whether or not we need to get her back to transplant.  Her 1st transplant was about 14 years ago.  She also got "a lot of mileage" out of the transplant.  We will subsequently plan to get her back in 6 weeks.  By then, she will be due for her Aredia.  I think that if we can get her into a complete remission or very good partial remission, we might want to consider a transplant for her.    ______________________________ [name], M.D. PRE/MEDQ  D:  11/11/2012  T:  11/12/2012  Job:  [id number]

## 2012-11-15 ENCOUNTER — Telehealth: Payer: Self-pay | Admitting: Nurse Practitioner

## 2012-11-15 NOTE — Telephone Encounter (Addendum)
Message copied by Jimmy Footman on Fri Nov 15, 2012  3:17 PM ------      Message from: [name]      Created: Tue Nov 12, 2012  5:26 PM       Call - myeloma is stable!!! [name] ------Called pt and informed her of Dr. Antonieta Pert. She verbalized her understanding and appreciation.

## 2012-11-18 ENCOUNTER — Other Ambulatory Visit: Payer: Self-pay | Admitting: *Deleted

## 2012-11-21 ENCOUNTER — Encounter: Payer: Self-pay | Admitting: Nurse Practitioner

## 2012-11-21 NOTE — Progress Notes (Signed)
RX faxed to [facility] [id number]) for Pomalyst 4mg  capsule. Signed via Dr. Julien Nordmann

## 2012-11-27 NOTE — Addendum Note (Signed)
Encounter addended by: Blair Promise, MD on: 11/27/2012  8:28 AM<BR>     Documentation filed: Notes Section

## 2012-11-27 NOTE — Addendum Note (Signed)
Encounter addended by: Blair Promise, MD on: 11/27/2012  8:56 AM<BR>     Documentation filed: Notes Section

## 2012-11-29 ENCOUNTER — Telehealth: Payer: Self-pay | Admitting: Hematology & Oncology

## 2012-11-29 NOTE — Telephone Encounter (Signed)
Faxed pt's most recent progress note to Tawni Carnes with [facility] from [place] today to: [phone].  DATE LAST SEEN:  11/11/2012

## 2012-12-13 ENCOUNTER — Other Ambulatory Visit: Payer: Self-pay | Admitting: *Deleted

## 2012-12-13 DIAGNOSIS — C9000 Multiple myeloma not having achieved remission: Secondary | ICD-10-CM

## 2012-12-13 MED ORDER — POMALIDOMIDE 4 MG PO CAPS: 4.0000 mg | ORAL_CAPSULE | Freq: Every day | ORAL | Status: DC

## 2012-12-13 NOTE — Telephone Encounter (Signed)
Sent Signed rx and authorization number to [facility]

## 2012-12-16 ENCOUNTER — Ambulatory Visit: Payer: BC Managed Care – PPO

## 2012-12-16 ENCOUNTER — Other Ambulatory Visit: Payer: Self-pay | Admitting: Hematology & Oncology

## 2012-12-16 ENCOUNTER — Other Ambulatory Visit: Payer: BC Managed Care – PPO | Admitting: Lab

## 2012-12-16 DIAGNOSIS — C9000 Multiple myeloma not having achieved remission: Secondary | ICD-10-CM

## 2012-12-18 LAB — UIFE/LIGHT CHAINS/TP QN, 24-HR UR
Albumin, U: DETECTED
Alpha 1, Urine: DETECTED — AB
Alpha 2, Urine: DETECTED — AB
Beta, Urine: DETECTED — AB
Free Kappa Lt Chains,Ur: 0.97 mg/dL (ref 0.14–2.42)
Free Kappa/Lambda Ratio: 0.13 ratio — ABNORMAL LOW (ref 2.04–10.37)
Free Lambda Excretion/Day: 194.22 mg/d
Free Lambda Lt Chains,Ur: 7.47 mg/dL — ABNORMAL HIGH (ref 0.02–0.67)
Free Lt Chn Excr Rate: 25.22 mg/d
Gamma Globulin, Urine: DETECTED — AB
Time: 24 hours
Total Protein, Urine-Ur/day: 231 mg/d — ABNORMAL HIGH (ref 10–140)
Total Protein, Urine: 8.9 mg/dL
Volume, Urine: 2600 mL

## 2012-12-20 ENCOUNTER — Telehealth: Payer: Self-pay | Admitting: Nurse Practitioner

## 2012-12-20 NOTE — Telephone Encounter (Addendum)
Message copied by Jimmy Footman on Fri Dec 20, 2012  1:40 PM ------      Message from: Burney Gauze R      Created: Thu Dec 19, 2012  1:48 PM       Call - urine myeloma protein is down to 194!!!  Katrinka Blazing ------LVN on her personal machine.

## 2012-12-23 ENCOUNTER — Ambulatory Visit (HOSPITAL_BASED_OUTPATIENT_CLINIC_OR_DEPARTMENT_OTHER): Payer: BC Managed Care – PPO

## 2012-12-23 ENCOUNTER — Ambulatory Visit (HOSPITAL_BASED_OUTPATIENT_CLINIC_OR_DEPARTMENT_OTHER): Payer: BC Managed Care – PPO | Admitting: Hematology & Oncology

## 2012-12-23 ENCOUNTER — Other Ambulatory Visit (HOSPITAL_BASED_OUTPATIENT_CLINIC_OR_DEPARTMENT_OTHER): Payer: BC Managed Care – PPO | Admitting: Lab

## 2012-12-23 VITALS — BP 110/65 | HR 79 | Temp 98.1°F | Resp 14 | Ht 63.0 in | Wt 201.0 lb

## 2012-12-23 DIAGNOSIS — Z8542 Personal history of malignant neoplasm of other parts of uterus: Secondary | ICD-10-CM

## 2012-12-23 DIAGNOSIS — C9000 Multiple myeloma not having achieved remission: Secondary | ICD-10-CM

## 2012-12-23 DIAGNOSIS — Z8579 Personal history of other malignant neoplasms of lymphoid, hematopoietic and related tissues: Secondary | ICD-10-CM

## 2012-12-23 LAB — CBC WITH DIFFERENTIAL (CANCER CENTER ONLY)
BASO#: 0.2 10*3/uL (ref 0.0–0.2)
BASO%: 4.8 % — ABNORMAL HIGH (ref 0.0–2.0)
EOS%: 2.8 % (ref 0.0–7.0)
Eosinophils Absolute: 0.1 10*3/uL (ref 0.0–0.5)
HCT: 32.1 % — ABNORMAL LOW (ref 34.8–46.6)
HGB: 10.5 g/dL — ABNORMAL LOW (ref 11.6–15.9)
LYMPH#: 1.1 10*3/uL (ref 0.9–3.3)
LYMPH%: 30 % (ref 14.0–48.0)
MCH: 27.3 pg (ref 26.0–34.0)
MCHC: 32.7 g/dL (ref 32.0–36.0)
MCV: 83 fL (ref 81–101)
MONO#: 0.6 10*3/uL (ref 0.1–0.9)
MONO%: 17 % — ABNORMAL HIGH (ref 0.0–13.0)
NEUT#: 1.6 10*3/uL (ref 1.5–6.5)
NEUT%: 45.4 % (ref 39.6–80.0)
Platelets: 274 10*3/uL (ref 145–400)
RBC: 3.85 10*6/uL (ref 3.70–5.32)
RDW: 16.9 % — ABNORMAL HIGH (ref 11.1–15.7)
WBC: 3.5 10*3/uL — ABNORMAL LOW (ref 3.9–10.0)

## 2012-12-23 LAB — CMP (CANCER CENTER ONLY)
ALT(SGPT): 24 U/L (ref 10–47)
AST: 17 U/L (ref 11–38)
Albumin: 3.6 g/dL (ref 3.3–5.5)
Alkaline Phosphatase: 35 U/L (ref 26–84)
BUN, Bld: 16 mg/dL (ref 7–22)
CO2: 29 mEq/L (ref 18–33)
Calcium: 9.4 mg/dL (ref 8.0–10.3)
Chloride: 101 mEq/L (ref 98–108)
Creat: 1 mg/dl (ref 0.6–1.2)
Glucose, Bld: 101 mg/dL (ref 73–118)
Potassium: 3.6 mEq/L (ref 3.3–4.7)
Sodium: 139 mEq/L (ref 128–145)
Total Bilirubin: 0.8 mg/dl (ref 0.20–1.60)
Total Protein: 6.7 g/dL (ref 6.4–8.1)

## 2012-12-23 MED ORDER — SODIUM CHLORIDE 0.9 % IV SOLN
90.0000 mg | Freq: Once | INTRAVENOUS | Status: AC
  Administered 2012-12-23: 90 mg via INTRAVENOUS
  Filled 2012-12-23: qty 10

## 2012-12-23 MED ORDER — SODIUM CHLORIDE 0.9 % IV SOLN
Freq: Once | INTRAVENOUS | Status: AC
  Administered 2012-12-23: 10:00:00 via INTRAVENOUS

## 2012-12-23 NOTE — Progress Notes (Signed)
This office note has been dictated.

## 2012-12-23 NOTE — Patient Instructions (Signed)
Pamidronate injection What is this medicine? PAMIDRONATE (pa mi DROE nate) slows calcium loss from bones. It is used to treat high calcium blood levels from cancer or Paget's disease. It is also used to treat bone pain and prevent fractures from certain cancers that have spread to the bone. This medicine may be used for other purposes; ask your health care provider or pharmacist if you have questions. COMMON BRAND NAME(S): Aredia What should I tell my health care provider before I take this medicine? They need to know if you have any of these conditions: -aspirin-sensitive asthma -dental disease -kidney disease -an unusual or allergic reaction to pamidronate, other medicines, foods, dyes, or preservatives -pregnant or trying to get pregnant -breast-feeding How should I use this medicine? This medicine is for infusion into a vein. It is given by a health care professional in a hospital or clinic setting. Talk to your pediatrician regarding the use of this medicine in children. This medicine is not approved for use in children. Overdosage: If you think you have taken too much of this medicine contact a poison control center or emergency room at once. NOTE: This medicine is only for you. Do not share this medicine with others. What if I miss a dose? This does not apply. What may interact with this medicine? -certain antibiotics given by injection -medicines for inflammation or pain like ibuprofen, naproxen -some diuretics like bumetanide, furosemide -cyclosporine -parathyroid hormone -tacrolimus -teriparatide -thalidomide This list may not describe all possible interactions. Give your health care provider a list of all the medicines, herbs, non-prescription drugs, or dietary supplements you use. Also tell them if you smoke, drink alcohol, or use illegal drugs. Some items may interact with your medicine. What should I watch for while using this medicine? Visit your doctor or health care  professional for regular checkups. It may be some time before you see the benefit from this medicine. Do not stop taking your medicine unless your doctor tells you to. Your doctor may order blood tests or other tests to see how you are doing. Women should inform their doctor if they wish to become pregnant or think they might be pregnant. There is a potential for serious side effects to an unborn child. Talk to your health care professional or pharmacist for more information. You should make sure that you get enough calcium and vitamin D while you are taking this medicine. Discuss the foods you eat and the vitamins you take with your health care professional. Some people who take this medicine have severe bone, joint, and/or muscle pain. This medicine may also increase your risk for a broken thigh bone. Tell your doctor right away if you have pain in your upper leg or groin. Tell your doctor if you have any pain that does not go away or that gets worse. What side effects may I notice from receiving this medicine? Side effects that you should report to your doctor or health care professional as soon as possible: -allergic reactions like skin rash, itching or hives, swelling of the face, lips, or tongue -black or tarry stools -changes in vision -eye inflammation, pain -high blood pressure -jaw pain, especially burning or cramping -muscle weakness -numb, tingling pain -swelling of feet or hands -trouble passing urine or change in the amount of urine -unable to move easily Side effects that usually do not require medical attention (report to your doctor or health care professional if they continue or are bothersome): -bone, joint, or muscle pain -constipation -dizzy, drowsy -  fever -headache -loss of appetite -nausea, vomiting -pain at site where injected This list may not describe all possible side effects. Call your doctor for medical advice about side effects. You may report side effects to  FDA at 1-800-FDA-1088. Where should I keep my medicine? This drug is given in a hospital or clinic and will not be stored at home. NOTE: This sheet is a summary. It may not cover all possible information. If you have questions about this medicine, talk to your doctor, pharmacist, or health care provider.  2014, Elsevier/Gold Standard. (2010-07-15 08:49:49)

## 2012-12-24 LAB — KAPPA/LAMBDA LIGHT CHAINS
Kappa free light chain: 0.94 mg/dL (ref 0.33–1.94)
Kappa:Lambda Ratio: 0.09 — ABNORMAL LOW (ref 0.26–1.65)
Lambda Free Lght Chn: 11 mg/dL — ABNORMAL HIGH (ref 0.57–2.63)

## 2013-01-07 NOTE — Progress Notes (Signed)
CC:   Emily Greer, M.D. Emily Greer, M.D. Emily Lewandowsky, Emily Greer  DIAGNOSES: 1. Recurrent lambda light chain myeloma. 2. History of stage I uterine adenocarcinoma.  CURRENT THERAPY: 1. The patient is status post 3 cycles of     pomalidomide/Biaxin/Decadron. 2. Aredia 90 mg IV every 6 weeks.  INTERIM HISTORY:  Emily Greer comes in for followup.  She is feeling pretty well.  We made some adjustments on her Decadron dose.  I think this seemed to help her a little bit.  She still says that she does get a little bit short of breath with the Decadron for several days after taking it.  Thankfully, she responded very nicely to treatment.  Her last 24-hour urine in November showed 194 mg a day of lambda light chain excretion.  Back in October, her serum lambda light chain was 8.26 mg/dL.  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  She has been a vice principal for many, many years.  She is looking forward to retiring.  She has had no problems with recovering from the uterine adenocarcinoma. This was taken out with surgery.  She had postoperative radiation therapy.  The patient has had no problems with leg swelling.  She has had no rashes.  She has had no fever, sweats, or chills.  PHYSICAL EXAMINATION:  General:  This is a well-developed, well- nourished African American female in no obvious distress.  Vital Signs: Temperature of 98.1, pulse 79, respiratory rate 14, blood pressure 110/65.  Weight is 201 pounds.  Head and Neck:  Normocephalic, atraumatic skull.  There are no ocular or oral lesions.  There are no palpable cervical or supraclavicular lymph nodes.  Lungs:  Clear bilaterally.  Cardiac:  Regular rate and rhythm with a normal S1 and S2. There are no murmurs, rubs, or bruits.  Abdomen:  Soft.  She has good bowel sounds.  There is no fluid wave.  There is no palpable abdominal mass.  There is no palpable hepatosplenomegaly.  Back:  No tenderness over the spine, ribs, or  hips.  Extremities:  No clubbing, cyanosis, or edema.  Neurological:  No focal neurological deficits.  LABORATORY STUDIES:  White cell count is 3.5, hemoglobin 10.5, hematocrit 32.1, platelet count 274.  Sodium 139, potassium 3.1, BUN 16, creatinine 1.0, calcium 9.4, with an albumin of 3.6.  IMPRESSION:  Emily Greer is a nice 62 year old African American female with recurrent lambda light chain myeloma.  Again, she has responded very nicely to the pomalidomide program.  I think as long as we are seeing a good response like this, I do not see an indication that we have to get her to transplant.  I still have not ruled out a transplant for her.  We will plan to go ahead and get her back during the Christmas holidays. By then, I think she will be close to, if not, already retired.    ______________________________ [name], M.D. PRE/MEDQ  D:  12/23/2012  T:  01/03/2013  Job:  [id number]

## 2013-01-14 ENCOUNTER — Other Ambulatory Visit: Payer: Self-pay | Admitting: Nurse Practitioner

## 2013-01-14 DIAGNOSIS — C9000 Multiple myeloma not having achieved remission: Secondary | ICD-10-CM

## 2013-01-14 MED ORDER — POMALIDOMIDE 4 MG PO CAPS: 4.0000 mg | ORAL_CAPSULE | Freq: Every day | ORAL | Status: DC

## 2013-01-24 ENCOUNTER — Other Ambulatory Visit: Payer: Self-pay | Admitting: Nurse Practitioner

## 2013-01-24 DIAGNOSIS — C9000 Multiple myeloma not having achieved remission: Secondary | ICD-10-CM

## 2013-01-24 MED ORDER — AZITHROMYCIN 250 MG PO TABS: ORAL_TABLET | ORAL | Status: DC

## 2013-02-03 ENCOUNTER — Ambulatory Visit (HOSPITAL_BASED_OUTPATIENT_CLINIC_OR_DEPARTMENT_OTHER): Payer: BC Managed Care – PPO | Admitting: Lab

## 2013-02-03 ENCOUNTER — Ambulatory Visit (HOSPITAL_BASED_OUTPATIENT_CLINIC_OR_DEPARTMENT_OTHER): Payer: BC Managed Care – PPO

## 2013-02-03 ENCOUNTER — Ambulatory Visit (HOSPITAL_BASED_OUTPATIENT_CLINIC_OR_DEPARTMENT_OTHER): Payer: BC Managed Care – PPO | Admitting: Hematology & Oncology

## 2013-02-03 VITALS — BP 121/59 | HR 84 | Temp 98.4°F | Resp 14 | Ht 63.0 in | Wt 201.0 lb

## 2013-02-03 DIAGNOSIS — C9000 Multiple myeloma not having achieved remission: Secondary | ICD-10-CM

## 2013-02-03 DIAGNOSIS — Z8579 Personal history of other malignant neoplasms of lymphoid, hematopoietic and related tissues: Secondary | ICD-10-CM

## 2013-02-03 DIAGNOSIS — D649 Anemia, unspecified: Secondary | ICD-10-CM

## 2013-02-03 DIAGNOSIS — Z8542 Personal history of malignant neoplasm of other parts of uterus: Secondary | ICD-10-CM

## 2013-02-03 DIAGNOSIS — D509 Iron deficiency anemia, unspecified: Secondary | ICD-10-CM

## 2013-02-03 LAB — CBC WITH DIFFERENTIAL (CANCER CENTER ONLY)
BASO#: 0 10*3/uL (ref 0.0–0.2)
BASO%: 0.2 % (ref 0.0–2.0)
EOS%: 0.1 % (ref 0.0–7.0)
Eosinophils Absolute: 0 10*3/uL (ref 0.0–0.5)
HCT: 29.5 % — ABNORMAL LOW (ref 34.8–46.6)
HGB: 9.7 g/dL — ABNORMAL LOW (ref 11.6–15.9)
LYMPH#: 0.8 10*3/uL — ABNORMAL LOW (ref 0.9–3.3)
LYMPH%: 5.9 % — ABNORMAL LOW (ref 14.0–48.0)
MCH: 27.1 pg (ref 26.0–34.0)
MCHC: 32.9 g/dL (ref 32.0–36.0)
MCV: 82 fL (ref 81–101)
MONO#: 1 10*3/uL — ABNORMAL HIGH (ref 0.1–0.9)
MONO%: 6.9 % (ref 0.0–13.0)
NEUT#: 12.2 10*3/uL — ABNORMAL HIGH (ref 1.5–6.5)
NEUT%: 86.9 % — ABNORMAL HIGH (ref 39.6–80.0)
Platelets: 442 10*3/uL — ABNORMAL HIGH (ref 145–400)
RBC: 3.58 10*6/uL — ABNORMAL LOW (ref 3.70–5.32)
RDW: 15.9 % — ABNORMAL HIGH (ref 11.1–15.7)
WBC: 14 10*3/uL — ABNORMAL HIGH (ref 3.9–10.0)

## 2013-02-03 LAB — CMP (CANCER CENTER ONLY)
ALT(SGPT): 22 U/L (ref 10–47)
AST: 12 U/L (ref 11–38)
Albumin: 3.3 g/dL (ref 3.3–5.5)
Alkaline Phosphatase: 45 U/L (ref 26–84)
BUN, Bld: 31 mg/dL — ABNORMAL HIGH (ref 7–22)
CO2: 29 mEq/L (ref 18–33)
Calcium: 9.1 mg/dL (ref 8.0–10.3)
Chloride: 101 mEq/L (ref 98–108)
Creat: 1.1 mg/dl (ref 0.6–1.2)
Glucose, Bld: 134 mg/dL — ABNORMAL HIGH (ref 73–118)
Potassium: 3.5 mEq/L (ref 3.3–4.7)
Sodium: 145 mEq/L (ref 128–145)
Total Bilirubin: 0.5 mg/dl (ref 0.20–1.60)
Total Protein: 7.3 g/dL (ref 6.4–8.1)

## 2013-02-03 LAB — TECHNOLOGIST REVIEW CHCC SATELLITE

## 2013-02-03 MED ORDER — SODIUM CHLORIDE 0.9 % IV SOLN
90.0000 mg | Freq: Once | INTRAVENOUS | Status: AC
  Administered 2013-02-03: 90 mg via INTRAVENOUS
  Filled 2013-02-03: qty 10

## 2013-02-03 MED ORDER — SODIUM CHLORIDE 0.9 % IV SOLN
Freq: Once | INTRAVENOUS | Status: AC
  Administered 2013-02-03: 10:00:00 via INTRAVENOUS

## 2013-02-03 NOTE — Progress Notes (Signed)
This office note has been dictated.

## 2013-02-03 NOTE — Patient Instructions (Signed)
Pamidronate injection What is this medicine? PAMIDRONATE (pa mi DROE nate) slows calcium loss from bones. It is used to treat high calcium blood levels from cancer or Paget's disease. It is also used to treat bone pain and prevent fractures from certain cancers that have spread to the bone. This medicine may be used for other purposes; ask your health care provider or pharmacist if you have questions. COMMON BRAND NAME(S): Aredia What should I tell my health care provider before I take this medicine? They need to know if you have any of these conditions: -aspirin-sensitive asthma -dental disease -kidney disease -an unusual or allergic reaction to pamidronate, other medicines, foods, dyes, or preservatives -pregnant or trying to get pregnant -breast-feeding How should I use this medicine? This medicine is for infusion into a vein. It is given by a health care professional in a hospital or clinic setting. Talk to your pediatrician regarding the use of this medicine in children. This medicine is not approved for use in children. Overdosage: If you think you have taken too much of this medicine contact a poison control center or emergency room at once. NOTE: This medicine is only for you. Do not share this medicine with others. What if I miss a dose? This does not apply. What may interact with this medicine? -certain antibiotics given by injection -medicines for inflammation or pain like ibuprofen, naproxen -some diuretics like bumetanide, furosemide -cyclosporine -parathyroid hormone -tacrolimus -teriparatide -thalidomide This list may not describe all possible interactions. Give your health care provider a list of all the medicines, herbs, non-prescription drugs, or dietary supplements you use. Also tell them if you smoke, drink alcohol, or use illegal drugs. Some items may interact with your medicine. What should I watch for while using this medicine? Visit your doctor or health care  professional for regular checkups. It may be some time before you see the benefit from this medicine. Do not stop taking your medicine unless your doctor tells you to. Your doctor may order blood tests or other tests to see how you are doing. Women should inform their doctor if they wish to become pregnant or think they might be pregnant. There is a potential for serious side effects to an unborn child. Talk to your health care professional or pharmacist for more information. You should make sure that you get enough calcium and vitamin D while you are taking this medicine. Discuss the foods you eat and the vitamins you take with your health care professional. Some people who take this medicine have severe bone, joint, and/or muscle pain. This medicine may also increase your risk for a broken thigh bone. Tell your doctor right away if you have pain in your upper leg or groin. Tell your doctor if you have any pain that does not go away or that gets worse. What side effects may I notice from receiving this medicine? Side effects that you should report to your doctor or health care professional as soon as possible: -allergic reactions like skin rash, itching or hives, swelling of the face, lips, or tongue -black or tarry stools -changes in vision -eye inflammation, pain -high blood pressure -jaw pain, especially burning or cramping -muscle weakness -numb, tingling pain -swelling of feet or hands -trouble passing urine or change in the amount of urine -unable to move easily Side effects that usually do not require medical attention (report to your doctor or health care professional if they continue or are bothersome): -bone, joint, or muscle pain -constipation -dizzy, drowsy -  fever -headache -loss of appetite -nausea, vomiting -pain at site where injected This list may not describe all possible side effects. Call your doctor for medical advice about side effects. You may report side effects to  FDA at 1-800-FDA-1088. Where should I keep my medicine? This drug is given in a hospital or clinic and will not be stored at home. NOTE: This sheet is a summary. It may not cover all possible information. If you have questions about this medicine, talk to your doctor, pharmacist, or health care provider.  2014, Elsevier/Gold Standard. (2010-07-15 08:49:49)

## 2013-02-04 NOTE — Progress Notes (Signed)
CC:   Emily Greer, M.D. Emily Lewandowsky, MD Emily Greer, M.D.  DIAGNOSES: 1. Recurrent lambda light chain myeloma. 2. History of stage I adenocarcinoma of the uterus.  CURRENT THERAPY: 1. The patient is status post 4 cycles of     pomalidomide/Biaxin/Decadron. 2. Aredia 90 mg IV q.6 weeks.  INTERIM HISTORY:  Emily Greer comes in for followup.  She had little bit of a tough Christmas.  She was sick with what sounded like the flu.  She is starting to feel better.  She is going to retire in April.  She has been in the [facility] now probably for about 35 years or so.  We have been following her light chains.  Her last lambda light chain back in November in the urine was down to 194 mg per day.  Her serum light chain was 11 mg/dL.  She has had no problems with fatigue or weakness outside of that associated with this recent cold.  She has had no diarrhea.  She has had no nausea, vomiting.  There has been no rashes.  She has had no bleeding or bruising.  PHYSICAL EXAMINATION:  General:  This is a fairly well-developed, well- nourished African American female in no obvious distress.  Vital Signs: Show temperature of 98.4, pulse 84, respiratory rate 14, blood pressure 121/59, weight is 201 pounds.  Head and Neck:  Shows a normocephalic, atraumatic skull.  There are no ocular or oral lesions.  There are no palpable cervical or supraclavicular lymph nodes.  Lungs:  Clear bilaterally.  Cardiac:  Regular rate and rhythm with normal S1, S2. There are no murmurs, rubs, or bruits.  Abdomen:  Soft.  She has good bowel sounds.  There is no fluid wave.  There is no palpable abdominal mass.  There is no palpable hepatosplenomegaly.  Back:  No tenderness over the spine, ribs, or hips.  Extremities:  Show no clubbing, cyanosis, or edema.  LABORATORIES STUDIES:  White cell count is 14, hemoglobin 9.7, hematocrit 29.5, platelet count 442.  BUN 31, creatinine  1.11.  Calcium 9 with an albumin of 3.3.  IMPRESSION:  Emily Greer is a very nice 63 year old African American female.  She had a history of lambda light chain myeloma.  She actually underwent stem cell transplant for this about 12 years ago.  She now has recurrence.  She is doing fairly well.  She is a little bit more anemic.  This may be reflective of this recent viral syndrome that she had.  We will go ahead with her Aredia today.  We will get her back in another 6 weeks.  It is still hard to say whether or not we need to get her to transplant.  We may have to __________ to have them take a look at her again.    ______________________________ Emily Greer, M.D. PRE/MEDQ  D:  02/03/2013  T:  02/04/2013  Job:  [id number]

## 2013-02-06 ENCOUNTER — Ambulatory Visit: Payer: BC Managed Care – PPO | Admitting: Radiation Oncology

## 2013-02-06 LAB — PROTEIN ELECTROPHORESIS, SERUM, WITH REFLEX
Albumin ELP: 53.6 % — ABNORMAL LOW (ref 55.8–66.1)
Alpha-1-Globulin: 8 % — ABNORMAL HIGH (ref 2.9–4.9)
Alpha-2-Globulin: 16.5 % — ABNORMAL HIGH (ref 7.1–11.8)
Beta 2: 5 % (ref 3.2–6.5)
Beta Globulin: 7.8 % — ABNORMAL HIGH (ref 4.7–7.2)
Gamma Globulin: 9.1 % — ABNORMAL LOW (ref 11.1–18.8)
M-Spike, %: 0.16 g/dL
Total Protein, Serum Electrophoresis: 6.6 g/dL (ref 6.0–8.3)

## 2013-02-06 LAB — IFE INTERPRETATION

## 2013-02-06 LAB — IGG, IGA, IGM
IgA: 148 mg/dL (ref 69–380)
IgG (Immunoglobin G), Serum: 619 mg/dL — ABNORMAL LOW (ref 690–1700)
IgM, Serum: 36 mg/dL — ABNORMAL LOW (ref 52–322)

## 2013-02-06 LAB — KAPPA/LAMBDA LIGHT CHAINS
Kappa free light chain: 0.75 mg/dL (ref 0.33–1.94)
Kappa:Lambda Ratio: 0.05 — ABNORMAL LOW (ref 0.26–1.65)
Lambda Free Lght Chn: 14.5 mg/dL — ABNORMAL HIGH (ref 0.57–2.63)

## 2013-02-10 ENCOUNTER — Other Ambulatory Visit: Payer: BC Managed Care – PPO | Admitting: Lab

## 2013-02-10 DIAGNOSIS — C9000 Multiple myeloma not having achieved remission: Secondary | ICD-10-CM

## 2013-02-12 LAB — UIFE/LIGHT CHAINS/TP QN, 24-HR UR
Albumin, U: DETECTED
Alpha 1, Urine: DETECTED — AB
Alpha 2, Urine: DETECTED — AB
Beta, Urine: DETECTED — AB
Free Kappa Lt Chains,Ur: 1.81 mg/dL (ref 0.14–2.42)
Free Kappa/Lambda Ratio: 0.12 ratio — ABNORMAL LOW (ref 2.04–10.37)
Free Lambda Excretion/Day: 186 mg/d
Free Lambda Lt Chains,Ur: 15.5 mg/dL — ABNORMAL HIGH (ref 0.02–0.67)
Free Lt Chn Excr Rate: 21.72 mg/d
Gamma Globulin, Urine: DETECTED — AB
Time: 24 hours
Total Protein, Urine-Ur/day: 220 mg/d — ABNORMAL HIGH (ref 10–140)
Total Protein, Urine: 18.3 mg/dL
Volume, Urine: 1200 mL

## 2013-02-14 ENCOUNTER — Telehealth: Payer: Self-pay | Admitting: Nurse Practitioner

## 2013-02-14 NOTE — Telephone Encounter (Addendum)
Message copied by Jimmy Footman on Fri Feb 14, 2013  4:30 PM ------      Message from: Burney Gauze R      Created: Wed Feb 12, 2013  5:25 PM       Call - myeloma is holding low and stable!!!  Please retire!!! [name] ------LVM on pt's personal machine. Instructed her to contact our office with any further questions.

## 2013-02-24 ENCOUNTER — Ambulatory Visit
Admission: RE | Admit: 2013-02-24 | Discharge: 2013-02-24 | Disposition: A | Discharge status: Home / Self Care | Payer: BC Managed Care – PPO | Source: Ambulatory Visit | Attending: Radiation Oncology | Admitting: Radiation Oncology

## 2013-02-24 ENCOUNTER — Other Ambulatory Visit: Payer: Self-pay | Admitting: *Deleted

## 2013-02-24 ENCOUNTER — Other Ambulatory Visit (HOSPITAL_COMMUNITY)
Admission: RE | Admit: 2013-02-24 | Discharge: 2013-02-24 | Disposition: A | Discharge status: Home / Self Care | Payer: BC Managed Care – PPO | Source: Ambulatory Visit | Attending: Radiation Oncology | Admitting: Radiation Oncology

## 2013-02-24 VITALS — BP 138/75 | HR 79 | Temp 97.7°F | Ht 63.0 in | Wt 207.0 lb

## 2013-02-24 DIAGNOSIS — Z01419 Encounter for gynecological examination (general) (routine) without abnormal findings: Secondary | ICD-10-CM | POA: Insufficient documentation

## 2013-02-24 DIAGNOSIS — C541 Malignant neoplasm of endometrium: Secondary | ICD-10-CM

## 2013-02-24 DIAGNOSIS — C9000 Multiple myeloma not having achieved remission: Secondary | ICD-10-CM

## 2013-02-24 MED ORDER — POMALIDOMIDE 4 MG PO CAPS: 4.0000 mg | ORAL_CAPSULE | Freq: Every day | ORAL | Status: DC

## 2013-02-24 NOTE — Progress Notes (Signed)
Radiation Oncology         [phone] ________________________________  Name: Emily Greer MRN: [id number]  Date: 02/24/2013  DOB: February 13, 1950  Follow-Up Visit Note  CC: Foye Spurling, MD  [name], MD  Diagnosis:   Recurrent endometrial cancer  Interval Since Last Radiation:  6  months  Narrative:  The patient returns today for routine follow-up.  She is doing well at this time. 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  She continues on therapy for her multiple myeloma. She denies any pain in the pelvis area,  Hematuria,  difficulties with urination or bowel complaints. She does notice occasional spotting after intercourse but no pain with intercourse or heavy bleeding.  She has been inconsistent with using her vaginal dilator and will work on improving this issue.                              ALLERGIES:  is allergic to codeine.  Meds: Current Outpatient Prescriptions  Medication Sig Dispense Refill  . aspirin EC 81 MG tablet Take 81 mg by mouth daily. 2 tabs daily      . calcium carbonate (OS-CAL) 600 MG TABS Take 600 mg by mouth daily with breakfast.       . clarithromycin (BIAXIN) 500 MG tablet Take 500 mg by mouth 2 (two) times daily.      [name] Kitchen dexamethasone (DECADRON) 4 MG tablet Take 4 mg by mouth. "Taking 5 tablets with meals every other week.      . Ergocalciferol (VITAMIN D2) 2000 UNITS TABS Take by mouth daily.      . hydrochlorothiazide (MICROZIDE) 12.5 MG capsule Take 1 capsule (12.5 mg total) by mouth daily.  30 capsule  4  . loperamide (IMODIUM) 2 MG capsule Take 2 mg by mouth as needed. For diarrhea      . loratadine (CLARITIN) 10 MG tablet Take 10 mg by mouth every morning. For allergies during allergy season      . metoprolol (TOPROL-XL) 50 MG 24 hr tablet Take 50 mg by mouth every morning.       . Olmesartan-Amlodipine-HCTZ 20-5-12.5 MG TABS Take 1 tablet by mouth daily.      . pomalidomide (POMALYST) 4 MG capsule  Take 1 capsule (4 mg total) by mouth daily. TAKE WITH WATER-- 21 DAYS ON & 7 DAYS OFF.Auth # [id number]  21 capsule  0  . potassium chloride SA (K-DUR,KLOR-CON) 20 MEQ tablet Take 1 tablet daily  30 tablet  4   No current facility-administered medications for this encounter.    Physical Findings: The patient is in no acute distress. Patient is alert and oriented.  height is $RemoveB'5\' 3"'QoztGyBF$  (1.6 m) and weight is 207 lb (93.895 kg). Her temperature is 97.7 F (36.5 C). Her blood pressure is 138/75 and her pulse is 79. Her oxygen saturation is 100%. .  No palpable supraclavicular adenopathy. The lungs are clear to auscultation. The heart has a regular rhythm and rate. The abdomen is soft and nontender with normal bowel sounds. There is no inguinal adenopathy. On pelvic examination the external genitalia are unremarkable. Speculum exam is performed. There are no mucosal lesions noted. There is an erythematous area along the right lateral distal vaginal wall. A Pap smear was obtained of this area as well as the vaginal cuff region. On Digital examination there is a bandlike area along the left mid lateral vaginal wall  but no mass palpable. On bimanual and rectovaginal examination there no pelvic masses appreciated.  Lab Findings: Lab Results  Component Value Date   WBC 14.0* 02/03/2013   HGB 9.7* 02/03/2013   HCT 29.5* 02/03/2013   MCV 82 02/03/2013   PLT 442* 02/03/2013    Radiographic Findings: No results found.  Impression:  No evidence of recurrence on clinical exam today, Pap smear pending  Plan:  Patient will followup with Dr. Biagio Quint in April and Dr. Skeet Latch in July, radiation oncology followup in October 2015.  ____________________________________ Blair Promise, MD

## 2013-02-24 NOTE — Progress Notes (Signed)
Emily Greer here for follow up after treatment for endometrial cancer.  She denies pain.  She reports fatigue.  She is currently taking 5 tablets of decadron every other week for multiple myeloma.  She denies vaginal bleeding except for some spotting after intercourse.  She has occasional diarrhea depending on what she eats.  She denies any bladder issues.

## 2013-02-26 ENCOUNTER — Telehealth: Payer: Self-pay | Admitting: Oncology

## 2013-02-26 NOTE — Telephone Encounter (Signed)
Called Emily Greer to let her know that her pap smear results were good per Dr. Sondra Come.

## 2013-03-05 ENCOUNTER — Other Ambulatory Visit: Payer: Self-pay | Admitting: Nurse Practitioner

## 2013-03-05 DIAGNOSIS — C9000 Multiple myeloma not having achieved remission: Secondary | ICD-10-CM

## 2013-03-05 MED ORDER — CLARITHROMYCIN 500 MG PO TABS: 500.0000 mg | ORAL_TABLET | Freq: Two times a day (BID) | ORAL | Status: DC

## 2013-03-14 ENCOUNTER — Encounter: Payer: Self-pay | Admitting: Hematology & Oncology

## 2013-03-17 ENCOUNTER — Ambulatory Visit (HOSPITAL_BASED_OUTPATIENT_CLINIC_OR_DEPARTMENT_OTHER): Payer: BC Managed Care – PPO | Admitting: Hematology & Oncology

## 2013-03-17 ENCOUNTER — Ambulatory Visit (HOSPITAL_BASED_OUTPATIENT_CLINIC_OR_DEPARTMENT_OTHER): Payer: BC Managed Care – PPO

## 2013-03-17 ENCOUNTER — Encounter: Payer: Self-pay | Admitting: Hematology & Oncology

## 2013-03-17 ENCOUNTER — Other Ambulatory Visit (HOSPITAL_BASED_OUTPATIENT_CLINIC_OR_DEPARTMENT_OTHER): Payer: BC Managed Care – PPO | Admitting: Lab

## 2013-03-17 VITALS — BP 110/57 | HR 84 | Temp 98.2°F | Resp 14 | Ht 63.0 in | Wt 205.0 lb

## 2013-03-17 DIAGNOSIS — C9000 Multiple myeloma not having achieved remission: Secondary | ICD-10-CM

## 2013-03-17 DIAGNOSIS — D509 Iron deficiency anemia, unspecified: Secondary | ICD-10-CM

## 2013-03-17 DIAGNOSIS — Z8579 Personal history of other malignant neoplasms of lymphoid, hematopoietic and related tissues: Secondary | ICD-10-CM

## 2013-03-17 LAB — CBC WITH DIFFERENTIAL (CANCER CENTER ONLY)
BASO#: 0.1 10*3/uL (ref 0.0–0.2)
BASO%: 2.8 % — ABNORMAL HIGH (ref 0.0–2.0)
EOS%: 7.8 % — ABNORMAL HIGH (ref 0.0–7.0)
Eosinophils Absolute: 0.3 10*3/uL (ref 0.0–0.5)
HCT: 31.4 % — ABNORMAL LOW (ref 34.8–46.6)
HGB: 10.2 g/dL — ABNORMAL LOW (ref 11.6–15.9)
LYMPH#: 0.5 10*3/uL — ABNORMAL LOW (ref 0.9–3.3)
LYMPH%: 12.2 % — ABNORMAL LOW (ref 14.0–48.0)
MCH: 27 pg (ref 26.0–34.0)
MCHC: 32.5 g/dL (ref 32.0–36.0)
MCV: 83 fL (ref 81–101)
MONO#: 0.4 10*3/uL (ref 0.1–0.9)
MONO%: 8 % (ref 0.0–13.0)
NEUT#: 3 10*3/uL (ref 1.5–6.5)
NEUT%: 69.2 % (ref 39.6–80.0)
Platelets: 285 10*3/uL (ref 145–400)
RBC: 3.78 10*6/uL (ref 3.70–5.32)
RDW: 16.7 % — ABNORMAL HIGH (ref 11.1–15.7)
WBC: 4.4 10*3/uL (ref 3.9–10.0)

## 2013-03-17 MED ORDER — SODIUM CHLORIDE 0.9 % IV SOLN
INTRAVENOUS | Status: DC
  Administered 2013-03-17: 11:00:00 via INTRAVENOUS

## 2013-03-17 MED ORDER — SODIUM CHLORIDE 0.9 % IV SOLN
90.0000 mg | Freq: Once | INTRAVENOUS | Status: AC
  Administered 2013-03-17: 90 mg via INTRAVENOUS
  Filled 2013-03-17: qty 10

## 2013-03-17 NOTE — Progress Notes (Signed)
[facility] [address], [place]. [place], [place].  [address] [phone] (fax)   Your Providers PCP: Foye Spurling, MD,  [phone]) Referring Provider: Foye Spurling, MD,  [phone])    DIAGNOSES:  Problem List Items Addressed This Visit     Oncology   Lambda Light Chain Myeloma - Primary      CURRENT THERAPY: Pomalidomide/Biaxin/Decadron Aredia every month   INTERIM HISTORY: Ms. Roccia comes in for followup. She had some left arm pain recently. She was evaluated thoroughly. There is no cardiac issues that were found.  Her urine light chains improve. By January her lambda light chain was 186 mg per day. I have with her serum studies, her M spike was 0.16 g/dL. Her serum light chain was 14.5 mg deciliter.  Is no bony pain. She's had no change in bowel or bladder habits. She's worried about some weight gain.    PHYSICAL EXAMINATION: This is a we'll go well-nourished African female. Her head and negative his ocular or oral lesions. Shows a probable cervical or supraclavicular lymph nodes. Lungs are clear. Cardiac exam regular in rhythm with no murmurs rubs or bruits. Abdomen is soft. His good bowel sounds. Is no fluid wave. There is no palpable hepatosplenomegaly back exam no tenderness over the spine ribs or hips. Extremities no clubbing cyanosis or edema. Skin exam no rashes. Neurological exam no focal neurological deficits.   Vital signs:   Filed Vitals:   03/17/13 0957  BP: 110/57  Pulse: 84  Temp: 98.2 F (36.8 C)  TempSrc: Oral  Resp: 14  Height: 5\' 3"  (1.6 m)  Weight: 205 lb (92.987 kg)      LABORATORY STUDIES:  CBC    Component Value Date/Time   WBC 4.4 03/17/2013 0931   WBC 9.0 11/01/2011 0423   WBC 5.0 11/27/2007 1600   RBC 3.26* 11/01/2011 0423   RBC 4.62 11/27/2007 1600   HGB 10.2* 03/17/2013 0931   HGB 8.6* 11/01/2011 0423   HGB 11.6 11/27/2007 1600   HCT 31.4* 03/17/2013 0931   HCT  25.7* 11/01/2011 0423   HCT 35.3 11/27/2007 1600   PLT 285 03/17/2013 0931   PLT 211 11/01/2011 0423   PLT 259 11/27/2007 1600   MCV 83 03/17/2013 0931   MCV 78.8 11/01/2011 0423   MCV 76.5* 11/27/2007 1600   MCH 27.0 03/17/2013 0931   MCH 26.4 11/01/2011 0423   MCH 25.2* 11/27/2007 1600   MCHC 32.5 03/17/2013 0931   MCHC 33.5 11/01/2011 0423   MCHC 32.9 11/27/2007 1600   RDW 16.7* 03/17/2013 0931   RDW 16.7* 11/01/2011 0423   RDW 15.2* 11/27/2007 1600   LYMPHSABS 0.5* 03/17/2013 0931   LYMPHSABS 1.4 10/27/2011 0925   LYMPHSABS 2.3 11/27/2007 1600   MONOABS 0.3 10/27/2011 0925   MONOABS 0.5 11/27/2007 1600   EOSABS 0.3 03/17/2013 0931   EOSABS 0.1 10/27/2011 0925   EOSABS 0.2 11/27/2007 1600   BASOSABS 0.1 03/17/2013 0931   BASOSABS 0.1 10/27/2011 0925   BASOSABS 0.1 11/27/2007 1600     IMPRESSION: Ms. Bogarin is a 63 y.o. female with a history of recurrenrt lambda light chain myeloma. She is on treatment with Pomalidomide/Biaxin/Decadron. She's responding by her light chain. Her last urine light chain was down to 186 mg per day.  She is tolerating treatment well. I don't see that we have to pursue a bone marrow transplant on her right now.  I think as long as  we see her results improve, then we can just treat accordingly.  We will go ahead and get her back to see [place] in one more month.  PLAN: We will plan to get her back to see [place] in another one month. If there is any problem in between visits, she can certainly come back and see [place].   ______________________________  [name], M.D.  03/17/2013 10:20 AM

## 2013-03-17 NOTE — Patient Instructions (Signed)
Pamidronate injection What is this medicine? PAMIDRONATE (pa mi DROE nate) slows calcium loss from bones. It is used to treat high calcium blood levels from cancer or Paget's disease. It is also used to treat bone pain and prevent fractures from certain cancers that have spread to the bone. This medicine may be used for other purposes; ask your health care provider or pharmacist if you have questions. COMMON BRAND NAME(S): Aredia What should I tell my health care provider before I take this medicine? They need to know if you have any of these conditions: -aspirin-sensitive asthma -dental disease -kidney disease -an unusual or allergic reaction to pamidronate, other medicines, foods, dyes, or preservatives -pregnant or trying to get pregnant -breast-feeding How should I use this medicine? This medicine is for infusion into a vein. It is given by a health care professional in a hospital or clinic setting. Talk to your pediatrician regarding the use of this medicine in children. This medicine is not approved for use in children. Overdosage: If you think you have taken too much of this medicine contact a poison control center or emergency room at once. NOTE: This medicine is only for you. Do not share this medicine with others. What if I miss a dose? This does not apply. What may interact with this medicine? -certain antibiotics given by injection -medicines for inflammation or pain like ibuprofen, naproxen -some diuretics like bumetanide, furosemide -cyclosporine -parathyroid hormone -tacrolimus -teriparatide -thalidomide This list may not describe all possible interactions. Give your health care provider a list of all the medicines, herbs, non-prescription drugs, or dietary supplements you use. Also tell them if you smoke, drink alcohol, or use illegal drugs. Some items may interact with your medicine. What should I watch for while using this medicine? Visit your doctor or health care  professional for regular checkups. It may be some time before you see the benefit from this medicine. Do not stop taking your medicine unless your doctor tells you to. Your doctor may order blood tests or other tests to see how you are doing. Women should inform their doctor if they wish to become pregnant or think they might be pregnant. There is a potential for serious side effects to an unborn child. Talk to your health care professional or pharmacist for more information. You should make sure that you get enough calcium and vitamin D while you are taking this medicine. Discuss the foods you eat and the vitamins you take with your health care professional. Some people who take this medicine have severe bone, joint, and/or muscle pain. This medicine may also increase your risk for a broken thigh bone. Tell your doctor right away if you have pain in your upper leg or groin. Tell your doctor if you have any pain that does not go away or that gets worse. What side effects may I notice from receiving this medicine? Side effects that you should report to your doctor or health care professional as soon as possible: -allergic reactions like skin rash, itching or hives, swelling of the face, lips, or tongue -black or tarry stools -changes in vision -eye inflammation, pain -high blood pressure -jaw pain, especially burning or cramping -muscle weakness -numb, tingling pain -swelling of feet or hands -trouble passing urine or change in the amount of urine -unable to move easily Side effects that usually do not require medical attention (report to your doctor or health care professional if they continue or are bothersome): -bone, joint, or muscle pain -constipation -dizzy, drowsy -  fever -headache -loss of appetite -nausea, vomiting -pain at site where injected This list may not describe all possible side effects. Call your doctor for medical advice about side effects. You may report side effects to  FDA at 1-800-FDA-1088. Where should I keep my medicine? This drug is given in a hospital or clinic and will not be stored at home. NOTE: This sheet is a summary. It may not cover all possible information. If you have questions about this medicine, talk to your doctor, pharmacist, or health care provider.  2014, Elsevier/Gold Standard. (2010-07-15 08:49:49)

## 2013-03-19 ENCOUNTER — Other Ambulatory Visit: Payer: Self-pay | Admitting: *Deleted

## 2013-03-19 DIAGNOSIS — C9000 Multiple myeloma not having achieved remission: Secondary | ICD-10-CM

## 2013-03-19 MED ORDER — POMALIDOMIDE 4 MG PO CAPS: 4.0000 mg | ORAL_CAPSULE | Freq: Every day | ORAL | Status: DC

## 2013-03-20 LAB — COMPREHENSIVE METABOLIC PANEL
ALT: 17 U/L (ref 0–35)
AST: 15 U/L (ref 0–37)
Albumin: 4.2 g/dL (ref 3.5–5.2)
Alkaline Phosphatase: 44 U/L (ref 39–117)
BUN: 14 mg/dL (ref 6–23)
CO2: 26 mEq/L (ref 19–32)
Calcium: 9.4 mg/dL (ref 8.4–10.5)
Chloride: 103 mEq/L (ref 96–112)
Creatinine, Ser: 1.3 mg/dL — ABNORMAL HIGH (ref 0.50–1.10)
Glucose, Bld: 114 mg/dL — ABNORMAL HIGH (ref 70–99)
Potassium: 3.5 mEq/L (ref 3.5–5.3)
Sodium: 141 mEq/L (ref 135–145)
Total Bilirubin: 0.5 mg/dL (ref 0.2–1.2)
Total Protein: 6.3 g/dL (ref 6.0–8.3)

## 2013-03-20 LAB — PROTEIN ELECTROPHORESIS, SERUM
Albumin ELP: 61.7 % (ref 55.8–66.1)
Alpha-1-Globulin: 4.2 % (ref 2.9–4.9)
Alpha-2-Globulin: 12.9 % — ABNORMAL HIGH (ref 7.1–11.8)
Beta 2: 5.3 % (ref 3.2–6.5)
Beta Globulin: 7.9 % — ABNORMAL HIGH (ref 4.7–7.2)
Gamma Globulin: 8 % — ABNORMAL LOW (ref 11.1–18.8)
Total Protein, Serum Electrophoresis: 6.3 g/dL (ref 6.0–8.3)

## 2013-03-20 LAB — IGG, IGA, IGM
IgA: 123 mg/dL (ref 69–380)
IgG (Immunoglobin G), Serum: 522 mg/dL — ABNORMAL LOW (ref 690–1700)
IgM, Serum: 37 mg/dL — ABNORMAL LOW (ref 52–322)

## 2013-03-20 LAB — KAPPA/LAMBDA LIGHT CHAINS
Kappa free light chain: 1.05 mg/dL (ref 0.33–1.94)
Kappa:Lambda Ratio: 0.11 — ABNORMAL LOW (ref 0.26–1.65)
Lambda Free Lght Chn: 9.67 mg/dL — ABNORMAL HIGH (ref 0.57–2.63)

## 2013-03-20 LAB — TRANSFERRIN RECEPTOR, SOLUABLE: Transferrin Receptor, Soluble: 1.65 mg/L (ref 0.76–1.76)

## 2013-03-21 ENCOUNTER — Telehealth: Payer: Self-pay | Admitting: *Deleted

## 2013-03-21 LAB — UIFE/LIGHT CHAINS/TP QN, 24-HR UR
Albumin, U: DETECTED
Alpha 1, Urine: DETECTED — AB
Alpha 2, Urine: DETECTED — AB
Beta, Urine: DETECTED — AB
Free Kappa Lt Chains,Ur: 3.35 mg/dL — ABNORMAL HIGH (ref 0.14–2.42)
Free Kappa/Lambda Ratio: 0.07 ratio — ABNORMAL LOW (ref 2.04–10.37)
Free Lambda Excretion/Day: 600 mg/d
Free Lambda Lt Chains,Ur: 50 mg/dL — ABNORMAL HIGH (ref 0.02–0.67)
Free Lt Chn Excr Rate: 40.2 mg/d
Gamma Globulin, Urine: DETECTED — AB
Time: 24 hours
Total Protein, Urine-Ur/day: 653 mg/d — ABNORMAL HIGH (ref 10–140)
Total Protein, Urine: 54.4 mg/dL
Volume, Urine: 1200 mL

## 2013-03-21 NOTE — Telephone Encounter (Signed)
Message copied by Rico Ala on Fri Mar 21, 2013 12:30 PM ------      Message from: [name]      Created: Fri Mar 21, 2013  6:35 AM       Call - myeloma still improving!!!!  [name] ------

## 2013-03-21 NOTE — Telephone Encounter (Signed)
Called patient to let her know that her labwork showed her myeloma is still iimproving.

## 2013-04-14 ENCOUNTER — Other Ambulatory Visit: Payer: Self-pay | Admitting: Hematology & Oncology

## 2013-04-15 ENCOUNTER — Other Ambulatory Visit: Payer: Self-pay | Admitting: *Deleted

## 2013-04-15 DIAGNOSIS — C9000 Multiple myeloma not having achieved remission: Secondary | ICD-10-CM

## 2013-04-15 MED ORDER — POMALIDOMIDE 4 MG PO CAPS: 4.0000 mg | ORAL_CAPSULE | Freq: Every day | ORAL | Status: DC

## 2013-04-21 ENCOUNTER — Other Ambulatory Visit: Payer: BC Managed Care – PPO | Admitting: Lab

## 2013-04-21 ENCOUNTER — Other Ambulatory Visit: Payer: Self-pay | Admitting: Hematology & Oncology

## 2013-04-21 DIAGNOSIS — C9000 Multiple myeloma not having achieved remission: Secondary | ICD-10-CM

## 2013-04-23 LAB — UIFE/LIGHT CHAINS/TP QN, 24-HR UR
Albumin, U: DETECTED
Alpha 1, Urine: DETECTED — AB
Alpha 2, Urine: DETECTED — AB
Beta, Urine: DETECTED — AB
Free Kappa Lt Chains,Ur: 1.31 mg/dL (ref 0.14–2.42)
Free Kappa/Lambda Ratio: 0.1 ratio — ABNORMAL LOW (ref 2.04–10.37)
Free Lambda Excretion/Day: 165 mg/d
Free Lambda Lt Chains,Ur: 13.2 mg/dL — ABNORMAL HIGH (ref 0.02–0.67)
Free Lt Chn Excr Rate: 16.38 mg/d
Gamma Globulin, Urine: DETECTED — AB
Time: 24 hours
Total Protein, Urine-Ur/day: 193 mg/d — ABNORMAL HIGH (ref 10–140)
Total Protein, Urine: 15.4 mg/dL
Volume, Urine: 1250 mL

## 2013-04-24 ENCOUNTER — Telehealth: Payer: Self-pay | Admitting: *Deleted

## 2013-04-24 NOTE — Telephone Encounter (Addendum)
Message copied by Rico Ala on Thu Apr 24, 2013  1:18 PM ------      Message from: Burney Gauze R      Created: Wed Apr 23, 2013  6:44 PM       Please call and let her know that the urine myeloma protein continues to improve. Thanks. [name] ------  Message left on personalized VM with this information. dph

## 2013-04-28 ENCOUNTER — Encounter: Payer: Self-pay | Admitting: Hematology & Oncology

## 2013-04-28 ENCOUNTER — Ambulatory Visit (HOSPITAL_BASED_OUTPATIENT_CLINIC_OR_DEPARTMENT_OTHER): Payer: BC Managed Care – PPO | Admitting: Hematology & Oncology

## 2013-04-28 ENCOUNTER — Ambulatory Visit (HOSPITAL_BASED_OUTPATIENT_CLINIC_OR_DEPARTMENT_OTHER): Payer: BC Managed Care – PPO

## 2013-04-28 ENCOUNTER — Ambulatory Visit (HOSPITAL_BASED_OUTPATIENT_CLINIC_OR_DEPARTMENT_OTHER): Payer: BC Managed Care – PPO | Admitting: Lab

## 2013-04-28 VITALS — BP 106/57 | HR 71 | Temp 98.1°F | Resp 16 | Ht 63.0 in | Wt 206.0 lb

## 2013-04-28 DIAGNOSIS — Z8542 Personal history of malignant neoplasm of other parts of uterus: Secondary | ICD-10-CM

## 2013-04-28 DIAGNOSIS — C9000 Multiple myeloma not having achieved remission: Secondary | ICD-10-CM

## 2013-04-28 DIAGNOSIS — Z8579 Personal history of other malignant neoplasms of lymphoid, hematopoietic and related tissues: Secondary | ICD-10-CM

## 2013-04-28 DIAGNOSIS — D509 Iron deficiency anemia, unspecified: Secondary | ICD-10-CM

## 2013-04-28 LAB — CBC WITH DIFFERENTIAL (CANCER CENTER ONLY)
BASO#: 0 10*3/uL (ref 0.0–0.2)
BASO%: 0.3 % (ref 0.0–2.0)
EOS%: 0.2 % (ref 0.0–7.0)
Eosinophils Absolute: 0 10*3/uL (ref 0.0–0.5)
HCT: 30.1 % — ABNORMAL LOW (ref 34.8–46.6)
HGB: 9.8 g/dL — ABNORMAL LOW (ref 11.6–15.9)
LYMPH#: 1.6 10*3/uL (ref 0.9–3.3)
LYMPH%: 25.5 % (ref 14.0–48.0)
MCH: 27.2 pg (ref 26.0–34.0)
MCHC: 32.6 g/dL (ref 32.0–36.0)
MCV: 84 fL (ref 81–101)
MONO#: 1.5 10*3/uL — ABNORMAL HIGH (ref 0.1–0.9)
MONO%: 22.9 % — ABNORMAL HIGH (ref 0.0–13.0)
NEUT#: 3.3 10*3/uL (ref 1.5–6.5)
NEUT%: 51.1 % (ref 39.6–80.0)
Platelets: 338 10*3/uL (ref 145–400)
RBC: 3.6 10*6/uL — ABNORMAL LOW (ref 3.70–5.32)
RDW: 17.5 % — ABNORMAL HIGH (ref 11.1–15.7)
WBC: 6.4 10*3/uL (ref 3.9–10.0)

## 2013-04-28 MED ORDER — SODIUM CHLORIDE 0.9 % IV SOLN
INTRAVENOUS | Status: DC
  Administered 2013-04-28: 11:00:00 via INTRAVENOUS

## 2013-04-28 MED ORDER — SODIUM CHLORIDE 0.9 % IV SOLN
90.0000 mg | Freq: Once | INTRAVENOUS | Status: AC
  Administered 2013-04-28: 90 mg via INTRAVENOUS
  Filled 2013-04-28: qty 10

## 2013-04-28 NOTE — Patient Instructions (Signed)
Pamidronate injection What is this medicine? PAMIDRONATE (pa mi DROE nate) slows calcium loss from bones. It is used to treat high calcium blood levels from cancer or Paget's disease. It is also used to treat bone pain and prevent fractures from certain cancers that have spread to the bone. This medicine may be used for other purposes; ask your health care provider or pharmacist if you have questions. COMMON BRAND NAME(S): Aredia What should I tell my health care provider before I take this medicine? They need to know if you have any of these conditions: -aspirin-sensitive asthma -dental disease -kidney disease -an unusual or allergic reaction to pamidronate, other medicines, foods, dyes, or preservatives -pregnant or trying to get pregnant -breast-feeding How should I use this medicine? This medicine is for infusion into a vein. It is given by a health care professional in a hospital or clinic setting. Talk to your pediatrician regarding the use of this medicine in children. This medicine is not approved for use in children. Overdosage: If you think you have taken too much of this medicine contact a poison control center or emergency room at once. NOTE: This medicine is only for you. Do not share this medicine with others. What if I miss a dose? This does not apply. What may interact with this medicine? -certain antibiotics given by injection -medicines for inflammation or pain like ibuprofen, naproxen -some diuretics like bumetanide, furosemide -cyclosporine -parathyroid hormone -tacrolimus -teriparatide -thalidomide This list may not describe all possible interactions. Give your health care provider a list of all the medicines, herbs, non-prescription drugs, or dietary supplements you use. Also tell them if you smoke, drink alcohol, or use illegal drugs. Some items may interact with your medicine. What should I watch for while using this medicine? Visit your doctor or health care  professional for regular checkups. It may be some time before you see the benefit from this medicine. Do not stop taking your medicine unless your doctor tells you to. Your doctor may order blood tests or other tests to see how you are doing. Women should inform their doctor if they wish to become pregnant or think they might be pregnant. There is a potential for serious side effects to an unborn child. Talk to your health care professional or pharmacist for more information. You should make sure that you get enough calcium and vitamin D while you are taking this medicine. Discuss the foods you eat and the vitamins you take with your health care professional. Some people who take this medicine have severe bone, joint, and/or muscle pain. This medicine may also increase your risk for a broken thigh bone. Tell your doctor right away if you have pain in your upper leg or groin. Tell your doctor if you have any pain that does not go away or that gets worse. What side effects may I notice from receiving this medicine? Side effects that you should report to your doctor or health care professional as soon as possible: -allergic reactions like skin rash, itching or hives, swelling of the face, lips, or tongue -black or tarry stools -changes in vision -eye inflammation, pain -high blood pressure -jaw pain, especially burning or cramping -muscle weakness -numb, tingling pain -swelling of feet or hands -trouble passing urine or change in the amount of urine -unable to move easily Side effects that usually do not require medical attention (report to your doctor or health care professional if they continue or are bothersome): -bone, joint, or muscle pain -constipation -dizzy, drowsy -  fever -headache -loss of appetite -nausea, vomiting -pain at site where injected This list may not describe all possible side effects. Call your doctor for medical advice about side effects. You may report side effects to  FDA at 1-800-FDA-1088. Where should I keep my medicine? This drug is given in a hospital or clinic and will not be stored at home. NOTE: This sheet is a summary. It may not cover all possible information. If you have questions about this medicine, talk to your doctor, pharmacist, or health care provider.  2014, Elsevier/Gold Standard. (2010-07-15 08:49:49)

## 2013-04-29 LAB — KAPPA/LAMBDA LIGHT CHAINS
Kappa free light chain: 0.03 mg/dL — ABNORMAL LOW (ref 0.33–1.94)
Kappa:Lambda Ratio: 0 — ABNORMAL LOW (ref 0.26–1.65)
Lambda Free Lght Chn: 9.64 mg/dL — ABNORMAL HIGH (ref 0.57–2.63)

## 2013-04-29 LAB — COMPREHENSIVE METABOLIC PANEL
ALT: 18 U/L (ref 0–35)
AST: 10 U/L (ref 0–37)
Albumin: 3.7 g/dL (ref 3.5–5.2)
Alkaline Phosphatase: 37 U/L — ABNORMAL LOW (ref 39–117)
BUN: 24 mg/dL — ABNORMAL HIGH (ref 6–23)
CO2: 28 mEq/L (ref 19–32)
Calcium: 9.1 mg/dL (ref 8.4–10.5)
Chloride: 103 mEq/L (ref 96–112)
Creatinine, Ser: 1.04 mg/dL (ref 0.50–1.10)
Glucose, Bld: 83 mg/dL (ref 70–99)
Potassium: 3.1 mEq/L — ABNORMAL LOW (ref 3.5–5.3)
Sodium: 140 mEq/L (ref 135–145)
Total Bilirubin: 0.3 mg/dL (ref 0.2–1.2)
Total Protein: 5.6 g/dL — ABNORMAL LOW (ref 6.0–8.3)

## 2013-04-29 LAB — PROTEIN ELECTROPHORESIS, SERUM, WITH REFLEX
Albumin ELP: 60.7 % (ref 55.8–66.1)
Alpha-1-Globulin: 5.5 % — ABNORMAL HIGH (ref 2.9–4.9)
Alpha-2-Globulin: 13.3 % — ABNORMAL HIGH (ref 7.1–11.8)
Beta 2: 4.3 % (ref 3.2–6.5)
Beta Globulin: 8.2 % — ABNORMAL HIGH (ref 4.7–7.2)
Gamma Globulin: 8 % — ABNORMAL LOW (ref 11.1–18.8)
Total Protein, Serum Electrophoresis: 5.6 g/dL — ABNORMAL LOW (ref 6.0–8.3)

## 2013-04-29 LAB — IGG, IGA, IGM
IgA: 88 mg/dL (ref 69–380)
IgG (Immunoglobin G), Serum: 469 mg/dL — ABNORMAL LOW (ref 690–1700)
IgM, Serum: 26 mg/dL — ABNORMAL LOW (ref 52–322)

## 2013-04-29 LAB — IFE INTERPRETATION

## 2013-05-05 NOTE — Progress Notes (Signed)
Hematology and Oncology Follow Up Visit  Emily Greer [id number] [birth year] 63 y.o. 05/05/2013   Principle Diagnosis:   Recurrent lambda light chain myeloma  Localized-stage I-endometrial cancer  Current Therapy:    Pomalidomide/Biaxin/Decadron  Aredia 60 mg IV Q. month     Interim History:  [name] is back for followup. She's doing well. She will retired at the end of the year. She is looking forward to this. 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  We did a 24-hour urine on her recently. This did show a continued decrease in her lambda light chain. She only had 165 mg per day to light chain excretion.  In her serum, there is no monoclonal spike. Her serum light chain was stable at 9.6 mg/dL.. She does have  a low immunoglobulin levels. We have to watch out for this. She's not had any problems with infections.  There's been no problems with the chemotherapy regimen. She's had no nausea vomiting. She's had no fever. She's had no rashes. There's been no headache. She's had no change in bowel or bladder habits.  There's been no issues with respect to the endometrial cancer.  Medications: Current outpatient prescriptions:allopurinol (ZYLOPRIM) 100 MG tablet, Take 100 mg by mouth daily. , Disp: , Rfl: ;  aspirin EC 81 MG tablet, Take 81 mg by mouth daily. 2 tabs daily, Disp: , Rfl: ;  calcium carbonate (OS-CAL) 600 MG TABS, Take 600 mg by mouth daily with breakfast. , Disp: , Rfl: ;  clarithromycin (BIAXIN) 500 MG tablet, Take 1 tablet (500 mg total) by mouth 2 (two) times daily., Disp: 60 tablet, Rfl: 4 dexamethasone (DECADRON) 4 MG tablet, Take 4 mg by mouth. "Taking 5 tablets with meals every other week., Disp: , Rfl: ;  Ergocalciferol (VITAMIN D2) 2000 UNITS TABS, Take by mouth daily., Disp: , Rfl: ;  hydrochlorothiazide (MICROZIDE) 12.5 MG capsule, TAKE ONE CAPSULE BY MOUTH DAILY, Disp: 90 capsule, Rfl: 0;  loperamide (IMODIUM) 2 MG capsule, Take 2 mg by mouth as  needed. For diarrhea, Disp: , Rfl:  loratadine (CLARITIN) 10 MG tablet, Take 10 mg by mouth every morning. For allergies during allergy season, Disp: , Rfl: ;  metoprolol (TOPROL-XL) 50 MG 24 hr tablet, Take 50 mg by mouth every morning. , Disp: , Rfl: ;  Olmesartan-Amlodipine-HCTZ 20-5-12.5 MG TABS, Take 1 tablet by mouth daily., Disp: , Rfl:  pomalidomide (POMALYST) 4 MG capsule, Take 1 capsule (4 mg total) by mouth daily. TAKE WITH WATER-- 21 DAYS ON & 7 DAYS OFF.Auth # [id number], Disp: 21 capsule, Rfl: 0;  potassium chloride SA (K-DUR,KLOR-CON) 20 MEQ tablet, Take 1 tablet daily, Disp: 30 tablet, Rfl: 4  Allergies:  Allergies  Allergen Reactions  . Codeine Nausea Only    Past Medical History, Surgical history, Social history, and Family History were reviewed and updated.  Review of Systems: As above  Physical Exam:  height is 5\' 3"  (1.6 m) and weight is 206 lb (93.441 kg). Her oral temperature is 98.1 F (36.7 C). Her blood pressure is 106/57 and her pulse is 71. Her respiration is 16.   Well-developed well-nourished African American female. Head and neck exam shows no ocular or oral lesions. She is no palpable cervical or supraclavicular lymph nodes. Lungs are clear. Cardiac exam regular in rhythm with no murmurs rubs or bruits. Abdomen is soft. Has good bowel sounds. There is no fluid wave. There is no palpable liver or spleen tip. I exam shows  no tenderness over the spine ribs or hips. Extremities shows no clubbing cyanosis or edema. She has good strength in her muscles. She's good range of motion of her joints. Skin exam no rashes ecchymoses or petechia. Neurological exam shows no focal neurological deficits.  Lab Results  Component Value Date   WBC 6.4 04/28/2013   HGB 9.8* 04/28/2013   HCT 30.1* 04/28/2013   MCV 84 04/28/2013   PLT 338 04/28/2013     Chemistry      Component Value Date/Time   NA 140 04/28/2013 0955   NA 145 02/03/2013 0910   K 3.1* 04/28/2013 0955   K 3.5 02/03/2013  0910   CL 103 04/28/2013 0955   CL 101 02/03/2013 0910   CO2 28 04/28/2013 0955   CO2 29 02/03/2013 0910   BUN 24* 04/28/2013 0955   BUN 31* 02/03/2013 0910   CREATININE 1.04 04/28/2013 0955   CREATININE 1.1 02/03/2013 0910      Component Value Date/Time   CALCIUM 9.1 04/28/2013 0955   CALCIUM 9.1 02/03/2013 0910   ALKPHOS 37* 04/28/2013 0955   ALKPHOS 45 02/03/2013 0910   AST 10 04/28/2013 0955   AST 12 02/03/2013 0910   ALT 18 04/28/2013 0955   ALT 22 02/03/2013 0910   BILITOT 0.3 04/28/2013 0955   BILITOT 0.50 02/03/2013 0910         Impression and Plan: Emily Greer is a 63 year old African American female. She is recurrent lambda light chain myeloma. She's doing well with the Pomalidomide program.  As long to see her like she is improving, I do not see that we have to get her over to transplant.  We do have to watch out for the decrease in her IgG level.  We will go ahead and continue to have her come back monthly for the Aredia. I think this also is a good adjunct to the Pomalidomide.  I'll plan to see her back myself in another 3 months.   [name], MD 4/6/20156:28 PM

## 2013-05-07 ENCOUNTER — Other Ambulatory Visit: Payer: Self-pay | Admitting: Hematology & Oncology

## 2013-05-08 ENCOUNTER — Other Ambulatory Visit: Payer: Self-pay | Admitting: *Deleted

## 2013-05-08 DIAGNOSIS — C9000 Multiple myeloma not having achieved remission: Secondary | ICD-10-CM

## 2013-05-08 MED ORDER — POMALIDOMIDE 4 MG PO CAPS: 4.0000 mg | ORAL_CAPSULE | Freq: Every day | ORAL | Status: DC

## 2013-05-28 ENCOUNTER — Ambulatory Visit (HOSPITAL_BASED_OUTPATIENT_CLINIC_OR_DEPARTMENT_OTHER): Payer: BC Managed Care – PPO

## 2013-05-28 ENCOUNTER — Other Ambulatory Visit (HOSPITAL_BASED_OUTPATIENT_CLINIC_OR_DEPARTMENT_OTHER): Payer: BC Managed Care – PPO | Admitting: Lab

## 2013-05-28 VITALS — BP 118/66 | HR 83 | Temp 97.1°F

## 2013-05-28 DIAGNOSIS — Z8579 Personal history of other malignant neoplasms of lymphoid, hematopoietic and related tissues: Secondary | ICD-10-CM

## 2013-05-28 DIAGNOSIS — C9000 Multiple myeloma not having achieved remission: Secondary | ICD-10-CM

## 2013-05-28 LAB — CBC WITH DIFFERENTIAL (CANCER CENTER ONLY)
BASO#: 0.1 10*3/uL (ref 0.0–0.2)
BASO%: 2.3 % — ABNORMAL HIGH (ref 0.0–2.0)
EOS%: 2.5 % (ref 0.0–7.0)
Eosinophils Absolute: 0.1 10*3/uL (ref 0.0–0.5)
HCT: 32.5 % — ABNORMAL LOW (ref 34.8–46.6)
HGB: 10.7 g/dL — ABNORMAL LOW (ref 11.6–15.9)
LYMPH#: 1.6 10*3/uL (ref 0.9–3.3)
LYMPH%: 32.8 % (ref 14.0–48.0)
MCH: 27.4 pg (ref 26.0–34.0)
MCHC: 32.9 g/dL (ref 32.0–36.0)
MCV: 83 fL (ref 81–101)
MONO#: 1 10*3/uL — ABNORMAL HIGH (ref 0.1–0.9)
MONO%: 20.1 % — ABNORMAL HIGH (ref 0.0–13.0)
NEUT#: 2 10*3/uL (ref 1.5–6.5)
NEUT%: 42.3 % (ref 39.6–80.0)
Platelets: 315 10*3/uL (ref 145–400)
RBC: 3.9 10*6/uL (ref 3.70–5.32)
RDW: 17.5 % — ABNORMAL HIGH (ref 11.1–15.7)
WBC: 4.7 10*3/uL (ref 3.9–10.0)

## 2013-05-28 LAB — BASIC METABOLIC PANEL - CANCER CENTER ONLY
BUN, Bld: 19 mg/dL (ref 7–22)
CO2: 30 mEq/L (ref 18–33)
Calcium: 9.2 mg/dL (ref 8.0–10.3)
Chloride: 101 mEq/L (ref 98–108)
Creat: 1 mg/dl (ref 0.6–1.2)
Glucose, Bld: 87 mg/dL (ref 73–118)
Potassium: 3.4 mEq/L (ref 3.3–4.7)
Sodium: 139 mEq/L (ref 128–145)

## 2013-05-28 MED ORDER — SODIUM CHLORIDE 0.9 % IV SOLN
INTRAVENOUS | Status: DC
  Administered 2013-05-28: 10:00:00 via INTRAVENOUS

## 2013-05-28 MED ORDER — SODIUM CHLORIDE 0.9 % IV SOLN
90.0000 mg | Freq: Once | INTRAVENOUS | Status: AC
  Administered 2013-05-28: 90 mg via INTRAVENOUS
  Filled 2013-05-28: qty 10

## 2013-05-28 NOTE — Patient Instructions (Signed)
Pamidronate injection What is this medicine? PAMIDRONATE (pa mi DROE nate) slows calcium loss from bones. It is used to treat high calcium blood levels from cancer or Paget's disease. It is also used to treat bone pain and prevent fractures from certain cancers that have spread to the bone. This medicine may be used for other purposes; ask your health care provider or pharmacist if you have questions. COMMON BRAND NAME(S): Aredia What should I tell my health care provider before I take this medicine? They need to know if you have any of these conditions: -aspirin-sensitive asthma -dental disease -kidney disease -an unusual or allergic reaction to pamidronate, other medicines, foods, dyes, or preservatives -pregnant or trying to get pregnant -breast-feeding How should I use this medicine? This medicine is for infusion into a vein. It is given by a health care professional in a hospital or clinic setting. Talk to your pediatrician regarding the use of this medicine in children. This medicine is not approved for use in children. Overdosage: If you think you have taken too much of this medicine contact a poison control center or emergency room at once. NOTE: This medicine is only for you. Do not share this medicine with others. What if I miss a dose? This does not apply. What may interact with this medicine? -certain antibiotics given by injection -medicines for inflammation or pain like ibuprofen, naproxen -some diuretics like bumetanide, furosemide -cyclosporine -parathyroid hormone -tacrolimus -teriparatide -thalidomide This list may not describe all possible interactions. Give your health care provider a list of all the medicines, herbs, non-prescription drugs, or dietary supplements you use. Also tell them if you smoke, drink alcohol, or use illegal drugs. Some items may interact with your medicine. What should I watch for while using this medicine? Visit your doctor or health care  professional for regular checkups. It may be some time before you see the benefit from this medicine. Do not stop taking your medicine unless your doctor tells you to. Your doctor may order blood tests or other tests to see how you are doing. Women should inform their doctor if they wish to become pregnant or think they might be pregnant. There is a potential for serious side effects to an unborn child. Talk to your health care professional or pharmacist for more information. You should make sure that you get enough calcium and vitamin D while you are taking this medicine. Discuss the foods you eat and the vitamins you take with your health care professional. Some people who take this medicine have severe bone, joint, and/or muscle pain. This medicine may also increase your risk for a broken thigh bone. Tell your doctor right away if you have pain in your upper leg or groin. Tell your doctor if you have any pain that does not go away or that gets worse. What side effects may I notice from receiving this medicine? Side effects that you should report to your doctor or health care professional as soon as possible: -allergic reactions like skin rash, itching or hives, swelling of the face, lips, or tongue -black or tarry stools -changes in vision -eye inflammation, pain -high blood pressure -jaw pain, especially burning or cramping -muscle weakness -numb, tingling pain -swelling of feet or hands -trouble passing urine or change in the amount of urine -unable to move easily Side effects that usually do not require medical attention (report to your doctor or health care professional if they continue or are bothersome): -bone, joint, or muscle pain -constipation -dizzy, drowsy -  fever -headache -loss of appetite -nausea, vomiting -pain at site where injected This list may not describe all possible side effects. Call your doctor for medical advice about side effects. You may report side effects to  FDA at 1-800-FDA-1088. Where should I keep my medicine? This drug is given in a hospital or clinic and will not be stored at home. NOTE: This sheet is a summary. It may not cover all possible information. If you have questions about this medicine, talk to your doctor, pharmacist, or health care provider.  2014, Elsevier/Gold Standard. (2010-07-15 08:49:49)

## 2013-06-05 ENCOUNTER — Other Ambulatory Visit: Payer: Self-pay | Admitting: Hematology & Oncology

## 2013-06-11 ENCOUNTER — Other Ambulatory Visit: Payer: BC Managed Care – PPO | Admitting: Lab

## 2013-06-11 ENCOUNTER — Ambulatory Visit: Payer: BC Managed Care – PPO

## 2013-06-11 ENCOUNTER — Ambulatory Visit: Payer: BC Managed Care – PPO | Admitting: Hematology & Oncology

## 2013-06-11 ENCOUNTER — Other Ambulatory Visit: Payer: Self-pay | Admitting: *Deleted

## 2013-06-11 DIAGNOSIS — C9000 Multiple myeloma not having achieved remission: Secondary | ICD-10-CM

## 2013-06-11 MED ORDER — POMALIDOMIDE 4 MG PO CAPS: 4.0000 mg | ORAL_CAPSULE | Freq: Every day | ORAL | Status: DC

## 2013-06-19 ENCOUNTER — Other Ambulatory Visit (HOSPITAL_BASED_OUTPATIENT_CLINIC_OR_DEPARTMENT_OTHER): Payer: BC Managed Care – PPO | Admitting: Lab

## 2013-06-19 ENCOUNTER — Ambulatory Visit (HOSPITAL_BASED_OUTPATIENT_CLINIC_OR_DEPARTMENT_OTHER): Payer: BC Managed Care – PPO

## 2013-06-19 VITALS — BP 111/70 | HR 93 | Temp 97.9°F

## 2013-06-19 DIAGNOSIS — Z8579 Personal history of other malignant neoplasms of lymphoid, hematopoietic and related tissues: Secondary | ICD-10-CM

## 2013-06-19 DIAGNOSIS — C9000 Multiple myeloma not having achieved remission: Secondary | ICD-10-CM

## 2013-06-19 LAB — CBC WITH DIFFERENTIAL (CANCER CENTER ONLY)
BASO#: 0.2 10*3/uL (ref 0.0–0.2)
BASO%: 4 % — ABNORMAL HIGH (ref 0.0–2.0)
EOS%: 9.7 % — ABNORMAL HIGH (ref 0.0–7.0)
Eosinophils Absolute: 0.4 10*3/uL (ref 0.0–0.5)
HCT: 32.7 % — ABNORMAL LOW (ref 34.8–46.6)
HGB: 10.8 g/dL — ABNORMAL LOW (ref 11.6–15.9)
LYMPH#: 0.8 10*3/uL — ABNORMAL LOW (ref 0.9–3.3)
LYMPH%: 18.7 % (ref 14.0–48.0)
MCH: 27.4 pg (ref 26.0–34.0)
MCHC: 33 g/dL (ref 32.0–36.0)
MCV: 83 fL (ref 81–101)
MONO#: 0.9 10*3/uL (ref 0.1–0.9)
MONO%: 20.2 % — ABNORMAL HIGH (ref 0.0–13.0)
NEUT#: 2.1 10*3/uL (ref 1.5–6.5)
NEUT%: 47.4 % (ref 39.6–80.0)
Platelets: 266 10*3/uL (ref 145–400)
RBC: 3.94 10*6/uL (ref 3.70–5.32)
RDW: 17 % — ABNORMAL HIGH (ref 11.1–15.7)
WBC: 4.5 10*3/uL (ref 3.9–10.0)

## 2013-06-19 LAB — BASIC METABOLIC PANEL - CANCER CENTER ONLY
BUN, Bld: 18 mg/dL (ref 7–22)
CO2: 28 mEq/L (ref 18–33)
Calcium: 9.7 mg/dL (ref 8.0–10.3)
Chloride: 101 mEq/L (ref 98–108)
Creat: 1.3 mg/dl — ABNORMAL HIGH (ref 0.6–1.2)
Glucose, Bld: 98 mg/dL (ref 73–118)
Potassium: 3.2 mEq/L — ABNORMAL LOW (ref 3.3–4.7)
Sodium: 140 mEq/L (ref 128–145)

## 2013-06-19 LAB — TECHNOLOGIST REVIEW CHCC SATELLITE

## 2013-06-19 MED ORDER — SODIUM CHLORIDE 0.9 % IV SOLN
90.0000 mg | Freq: Once | INTRAVENOUS | Status: AC
  Administered 2013-06-19: 90 mg via INTRAVENOUS
  Filled 2013-06-19: qty 10

## 2013-06-19 NOTE — Patient Instructions (Signed)
Pamidronate injection What is this medicine? PAMIDRONATE (pa mi DROE nate) slows calcium loss from bones. It is used to treat high calcium blood levels from cancer or Paget's disease. It is also used to treat bone pain and prevent fractures from certain cancers that have spread to the bone. This medicine may be used for other purposes; ask your health care provider or pharmacist if you have questions. COMMON BRAND NAME(S): Aredia What should I tell my health care provider before I take this medicine? They need to know if you have any of these conditions: -aspirin-sensitive asthma -dental disease -kidney disease -an unusual or allergic reaction to pamidronate, other medicines, foods, dyes, or preservatives -pregnant or trying to get pregnant -breast-feeding How should I use this medicine? This medicine is for infusion into a vein. It is given by a health care professional in a hospital or clinic setting. Talk to your pediatrician regarding the use of this medicine in children. This medicine is not approved for use in children. Overdosage: If you think you have taken too much of this medicine contact a poison control center or emergency room at once. NOTE: This medicine is only for you. Do not share this medicine with others. What if I miss a dose? This does not apply. What may interact with this medicine? -certain antibiotics given by injection -medicines for inflammation or pain like ibuprofen, naproxen -some diuretics like bumetanide, furosemide -cyclosporine -parathyroid hormone -tacrolimus -teriparatide -thalidomide This list may not describe all possible interactions. Give your health care provider a list of all the medicines, herbs, non-prescription drugs, or dietary supplements you use. Also tell them if you smoke, drink alcohol, or use illegal drugs. Some items may interact with your medicine. What should I watch for while using this medicine? Visit your doctor or health care  professional for regular checkups. It may be some time before you see the benefit from this medicine. Do not stop taking your medicine unless your doctor tells you to. Your doctor may order blood tests or other tests to see how you are doing. Women should inform their doctor if they wish to become pregnant or think they might be pregnant. There is a potential for serious side effects to an unborn child. Talk to your health care professional or pharmacist for more information. You should make sure that you get enough calcium and vitamin D while you are taking this medicine. Discuss the foods you eat and the vitamins you take with your health care professional. Some people who take this medicine have severe bone, joint, and/or muscle pain. This medicine may also increase your risk for a broken thigh bone. Tell your doctor right away if you have pain in your upper leg or groin. Tell your doctor if you have any pain that does not go away or that gets worse. What side effects may I notice from receiving this medicine? Side effects that you should report to your doctor or health care professional as soon as possible: -allergic reactions like skin rash, itching or hives, swelling of the face, lips, or tongue -black or tarry stools -changes in vision -eye inflammation, pain -high blood pressure -jaw pain, especially burning or cramping -muscle weakness -numb, tingling pain -swelling of feet or hands -trouble passing urine or change in the amount of urine -unable to move easily Side effects that usually do not require medical attention (report to your doctor or health care professional if they continue or are bothersome): -bone, joint, or muscle pain -constipation -dizzy, drowsy -  fever -headache -loss of appetite -nausea, vomiting -pain at site where injected This list may not describe all possible side effects. Call your doctor for medical advice about side effects. You may report side effects to  FDA at 1-800-FDA-1088. Where should I keep my medicine? This drug is given in a hospital or clinic and will not be stored at home. NOTE: This sheet is a summary. It may not cover all possible information. If you have questions about this medicine, talk to your doctor, pharmacist, or health care provider.  2014, Elsevier/Gold Standard. (2010-07-15 08:49:49)

## 2013-06-25 ENCOUNTER — Other Ambulatory Visit: Payer: BC Managed Care – PPO | Admitting: Lab

## 2013-06-25 ENCOUNTER — Ambulatory Visit: Payer: BC Managed Care – PPO

## 2013-07-03 ENCOUNTER — Ambulatory Visit: Payer: BC Managed Care – PPO

## 2013-07-03 ENCOUNTER — Other Ambulatory Visit: Payer: BC Managed Care – PPO | Admitting: Lab

## 2013-07-07 ENCOUNTER — Other Ambulatory Visit: Payer: BC Managed Care – PPO | Admitting: Lab

## 2013-07-07 ENCOUNTER — Other Ambulatory Visit (HOSPITAL_BASED_OUTPATIENT_CLINIC_OR_DEPARTMENT_OTHER): Payer: BC Managed Care – PPO | Admitting: Lab

## 2013-07-07 ENCOUNTER — Other Ambulatory Visit: Payer: Self-pay | Admitting: *Deleted

## 2013-07-07 DIAGNOSIS — C9000 Multiple myeloma not having achieved remission: Secondary | ICD-10-CM

## 2013-07-07 LAB — CBC WITH DIFFERENTIAL (CANCER CENTER ONLY)
BASO#: 0.1 10*3/uL (ref 0.0–0.2)
BASO%: 0.9 % (ref 0.0–2.0)
EOS%: 3.9 % (ref 0.0–7.0)
Eosinophils Absolute: 0.3 10*3/uL (ref 0.0–0.5)
HCT: 31.1 % — ABNORMAL LOW (ref 34.8–46.6)
HGB: 10.4 g/dL — ABNORMAL LOW (ref 11.6–15.9)
LYMPH#: 1.3 10*3/uL (ref 0.9–3.3)
LYMPH%: 18.6 % (ref 14.0–48.0)
MCH: 27.2 pg (ref 26.0–34.0)
MCHC: 33.4 g/dL (ref 32.0–36.0)
MCV: 81 fL (ref 81–101)
MONO#: 0.9 10*3/uL (ref 0.1–0.9)
MONO%: 12.8 % (ref 0.0–13.0)
NEUT#: 4.4 10*3/uL (ref 1.5–6.5)
NEUT%: 63.8 % (ref 39.6–80.0)
Platelets: 300 10*3/uL (ref 145–400)
RBC: 3.82 10*6/uL (ref 3.70–5.32)
RDW: 16.7 % — ABNORMAL HIGH (ref 11.1–15.7)
WBC: 6.9 10*3/uL (ref 3.9–10.0)

## 2013-07-07 LAB — BASIC METABOLIC PANEL - CANCER CENTER ONLY
BUN, Bld: 24 mg/dL — ABNORMAL HIGH (ref 7–22)
CO2: 28 mEq/L (ref 18–33)
Calcium: 9.1 mg/dL (ref 8.0–10.3)
Chloride: 99 mEq/L (ref 98–108)
Creat: 1.1 mg/dl (ref 0.6–1.2)
Glucose, Bld: 81 mg/dL (ref 73–118)
Potassium: 2.9 mEq/L — CL (ref 3.3–4.7)
Sodium: 138 mEq/L (ref 128–145)

## 2013-07-07 LAB — TECHNOLOGIST REVIEW CHCC SATELLITE

## 2013-07-07 MED ORDER — POTASSIUM CHLORIDE CRYS ER 20 MEQ PO TBCR: 20.0000 meq | EXTENDED_RELEASE_TABLET | Freq: Once | ORAL | Status: DC

## 2013-07-08 ENCOUNTER — Other Ambulatory Visit: Payer: Self-pay | Admitting: *Deleted

## 2013-07-08 DIAGNOSIS — C9000 Multiple myeloma not having achieved remission: Secondary | ICD-10-CM

## 2013-07-08 MED ORDER — POMALIDOMIDE 4 MG PO CAPS: 4.0000 mg | ORAL_CAPSULE | Freq: Every day | ORAL | Status: DC

## 2013-07-10 ENCOUNTER — Other Ambulatory Visit: Payer: BC Managed Care – PPO | Admitting: Lab

## 2013-07-10 DIAGNOSIS — C9000 Multiple myeloma not having achieved remission: Secondary | ICD-10-CM

## 2013-07-14 LAB — UIFE/LIGHT CHAINS/TP QN, 24-HR UR
Albumin, U: DETECTED
Alpha 1, Urine: DETECTED — AB
Alpha 2, Urine: DETECTED — AB
Beta, Urine: DETECTED — AB
Free Kappa Lt Chains,Ur: 2.54 mg/dL — ABNORMAL HIGH (ref 0.14–2.42)
Free Kappa/Lambda Ratio: 0.08 ratio — ABNORMAL LOW (ref 2.04–10.37)
Free Lambda Excretion/Day: 350.9 mg/d
Free Lambda Lt Chains,Ur: 31.9 mg/dL — ABNORMAL HIGH (ref 0.02–0.67)
Free Lt Chn Excr Rate: 27.94 mg/d
Gamma Globulin, Urine: DETECTED — AB
Time: 24 hours
Total Protein, Urine-Ur/day: 387 mg/d — ABNORMAL HIGH (ref 10–140)
Total Protein, Urine: 35.2 mg/dL
Volume, Urine: 1100 mL

## 2013-07-21 ENCOUNTER — Ambulatory Visit: Payer: BC Managed Care – PPO | Admitting: Hematology & Oncology

## 2013-07-21 ENCOUNTER — Ambulatory Visit (HOSPITAL_BASED_OUTPATIENT_CLINIC_OR_DEPARTMENT_OTHER): Payer: BC Managed Care – PPO

## 2013-07-21 ENCOUNTER — Encounter: Payer: Self-pay | Admitting: Hematology & Oncology

## 2013-07-21 ENCOUNTER — Ambulatory Visit (HOSPITAL_BASED_OUTPATIENT_CLINIC_OR_DEPARTMENT_OTHER): Payer: BC Managed Care – PPO | Admitting: Hematology & Oncology

## 2013-07-21 ENCOUNTER — Other Ambulatory Visit: Payer: BC Managed Care – PPO | Admitting: Lab

## 2013-07-21 ENCOUNTER — Other Ambulatory Visit (HOSPITAL_BASED_OUTPATIENT_CLINIC_OR_DEPARTMENT_OTHER): Payer: BC Managed Care – PPO | Admitting: Lab

## 2013-07-21 VITALS — BP 120/60 | HR 73 | Temp 97.9°F | Resp 16 | Ht 63.0 in | Wt 212.0 lb

## 2013-07-21 DIAGNOSIS — C9000 Multiple myeloma not having achieved remission: Secondary | ICD-10-CM

## 2013-07-21 DIAGNOSIS — Z8579 Personal history of other malignant neoplasms of lymphoid, hematopoietic and related tissues: Secondary | ICD-10-CM

## 2013-07-21 LAB — CMP (CANCER CENTER ONLY)
ALT(SGPT): 22 U/L (ref 10–47)
AST: 16 U/L (ref 11–38)
Albumin: 3.6 g/dL (ref 3.3–5.5)
Alkaline Phosphatase: 33 U/L (ref 26–84)
BUN, Bld: 21 mg/dL (ref 7–22)
CO2: 30 mEq/L (ref 18–33)
Calcium: 9.4 mg/dL (ref 8.0–10.3)
Chloride: 100 mEq/L (ref 98–108)
Creat: 1.3 mg/dl — ABNORMAL HIGH (ref 0.6–1.2)
Glucose, Bld: 81 mg/dL (ref 73–118)
Potassium: 3.1 mEq/L — ABNORMAL LOW (ref 3.3–4.7)
Sodium: 137 mEq/L (ref 128–145)
Total Bilirubin: 0.6 mg/dl (ref 0.20–1.60)
Total Protein: 6.1 g/dL — ABNORMAL LOW (ref 6.4–8.1)

## 2013-07-21 MED ORDER — SODIUM CHLORIDE 0.9 % IV SOLN
90.0000 mg | Freq: Once | INTRAVENOUS | Status: AC
  Administered 2013-07-21: 90 mg via INTRAVENOUS
  Filled 2013-07-21: qty 10

## 2013-07-21 NOTE — Patient Instructions (Signed)
Pamidronate injection What is this medicine? PAMIDRONATE (pa mi DROE nate) slows calcium loss from bones. It is used to treat high calcium blood levels from cancer or Paget's disease. It is also used to treat bone pain and prevent fractures from certain cancers that have spread to the bone. This medicine may be used for other purposes; ask your health care provider or pharmacist if you have questions. COMMON BRAND NAME(S): Aredia What should I tell my health care provider before I take this medicine? They need to know if you have any of these conditions: -aspirin-sensitive asthma -dental disease -kidney disease -an unusual or allergic reaction to pamidronate, other medicines, foods, dyes, or preservatives -pregnant or trying to get pregnant -breast-feeding How should I use this medicine? This medicine is for infusion into a vein. It is given by a health care professional in a hospital or clinic setting. Talk to your pediatrician regarding the use of this medicine in children. This medicine is not approved for use in children. Overdosage: If you think you have taken too much of this medicine contact a poison control center or emergency room at once. NOTE: This medicine is only for you. Do not share this medicine with others. What if I miss a dose? This does not apply. What may interact with this medicine? -certain antibiotics given by injection -medicines for inflammation or pain like ibuprofen, naproxen -some diuretics like bumetanide, furosemide -cyclosporine -parathyroid hormone -tacrolimus -teriparatide -thalidomide This list may not describe all possible interactions. Give your health care provider a list of all the medicines, herbs, non-prescription drugs, or dietary supplements you use. Also tell them if you smoke, drink alcohol, or use illegal drugs. Some items may interact with your medicine. What should I watch for while using this medicine? Visit your doctor or health care  professional for regular checkups. It may be some time before you see the benefit from this medicine. Do not stop taking your medicine unless your doctor tells you to. Your doctor may order blood tests or other tests to see how you are doing. Women should inform their doctor if they wish to become pregnant or think they might be pregnant. There is a potential for serious side effects to an unborn child. Talk to your health care professional or pharmacist for more information. You should make sure that you get enough calcium and vitamin D while you are taking this medicine. Discuss the foods you eat and the vitamins you take with your health care professional. Some people who take this medicine have severe bone, joint, and/or muscle pain. This medicine may also increase your risk for a broken thigh bone. Tell your doctor right away if you have pain in your upper leg or groin. Tell your doctor if you have any pain that does not go away or that gets worse. What side effects may I notice from receiving this medicine? Side effects that you should report to your doctor or health care professional as soon as possible: -allergic reactions like skin rash, itching or hives, swelling of the face, lips, or tongue -black or tarry stools -changes in vision -eye inflammation, pain -high blood pressure -jaw pain, especially burning or cramping -muscle weakness -numb, tingling pain -swelling of feet or hands -trouble passing urine or change in the amount of urine -unable to move easily Side effects that usually do not require medical attention (report to your doctor or health care professional if they continue or are bothersome): -bone, joint, or muscle pain -constipation -dizzy, drowsy -  fever -headache -loss of appetite -nausea, vomiting -pain at site where injected This list may not describe all possible side effects. Call your doctor for medical advice about side effects. You may report side effects to  FDA at 1-800-FDA-1088. Where should I keep my medicine? This drug is given in a hospital or clinic and will not be stored at home. NOTE: This sheet is a summary. It may not cover all possible information. If you have questions about this medicine, talk to your doctor, pharmacist, or health care provider.  2015, [employer]vier/Gold Standard. (2010-07-15 08:49:49)

## 2013-07-21 NOTE — Progress Notes (Signed)
Hematology and Oncology Follow Up Visit  [name] [id number] 08/28/1950 63 y.o. 07/21/2013   Principle Diagnosis:   Recurrent lambda likely myeloma  Localized stage I endometrial carcinoma  Current Therapy:    Pomalidomide/Biaxin/Decadron  Aredia 60 mg IV q. month     Interim History:  [name] is back for followup. We last saw her back in March. Her 24-hour urine goes up and down. When we did her 24 urine recently, the lambda light chain was 350 mg per day. Her lambda light chain in the serum was 9.64 mg/dL. There was no monoclonal spike in her serum. She feels well. She's having no bony pain. She is gaining some weight which am sure is from the Decadron. She is on the 12 mg every other week of the Decadron. She's had no change in bowel bladder habits. She's had no fever. She's had no rashes. There's been some leg swelling.  Medications: Current outpatient prescriptions:allopurinol (ZYLOPRIM) 100 MG tablet, Take 100 mg by mouth daily. , Disp: , Rfl: ;  aspirin EC 81 MG tablet, Take 81 mg by mouth daily. 2 tabs daily, Disp: , Rfl: ;  calcium carbonate (OS-CAL) 600 MG TABS, Take 600 mg by mouth daily with breakfast. , Disp: , Rfl: ;  clarithromycin (BIAXIN) 500 MG tablet, Take 1 tablet (500 mg total) by mouth 2 (two) times daily., Disp: 60 tablet, Rfl: 4 dexamethasone (DECADRON) 4 MG tablet, Take 4 mg by mouth. "Taking 3 tablets with meals every other week., Disp: , Rfl: ;  Ergocalciferol (VITAMIN D2) 2000 UNITS TABS, Take by mouth daily., Disp: , Rfl: ;  hydrochlorothiazide (MICROZIDE) 12.5 MG capsule, TAKE ONE CAPSULE BY MOUTH DAILY, Disp: 90 capsule, Rfl: 0;  loperamide (IMODIUM) 2 MG capsule, Take 2 mg by mouth as needed. For diarrhea, Disp: , Rfl:  loratadine (CLARITIN) 10 MG tablet, Take 10 mg by mouth every morning. For allergies during allergy season, Disp: , Rfl: ;  metoprolol (TOPROL-XL) 50 MG 24 hr tablet, Take 50 mg by mouth every morning. , Disp: , Rfl: ;   Olmesartan-Amlodipine-HCTZ 20-5-12.5 MG TABS, Take 1 tablet by mouth daily., Disp: , Rfl:  pomalidomide (POMALYST) 4 MG capsule, Take 1 capsule (4 mg total) by mouth daily. TAKE WITH WATER-- 21 DAYS ON & 7 DAYS OFF.Josem Kaufmann [id number], Disp: 21 capsule, Rfl: 0;  potassium chloride SA (K-DUR,KLOR-CON) 20 MEQ tablet, Take 1 tablet (20 mEq total) by mouth once., Disp: 10 tablet, Rfl: 0  Allergies:  Allergies  Allergen Reactions  . Codeine Nausea Only    Past Medical History, Surgical history, Social history, and Family History were reviewed and updated.  Review of Systems: As above  Physical Exam:  height is 5\' 3"  (1.6 m) and weight is 212 lb (96.163 kg). Her oral temperature is 97.9 F (36.6 C). Her blood pressure is 120/60 and her pulse is 73. Her respiration is 16.   Well-developed and well-nourished [name]. Her head and exam shows no ocular or oral lesions. There are no palpable cervical or supraclavicular lymph nodes. Lungs are clear bilaterally. Cardiac exam regular rate and rhythm with no murmurs rubs or bruits. Abdomen is soft. She has good bowel sounds. There is no palpable liver or spleen. Back exam no tenderness over the spine ribs or hips. Extremities shows no clubbing cyanosis or edema. Neurological exam shows no focal neurological deficits. Skin exam no rashes.  Lab Results  Component Value Date   WBC 6.9 07/07/2013   HGB 10.4* 07/07/2013  HCT 31.1* 07/07/2013   MCV 81 07/07/2013   PLT 300 07/07/2013     Chemistry      Component Value Date/Time   NA 137 07/21/2013 1110   NA 140 04/28/2013 0955   K 3.1* 07/21/2013 1110   K 3.1* 04/28/2013 0955   CL 100 07/21/2013 1110   CL 103 04/28/2013 0955   CO2 30 07/21/2013 1110   CO2 28 04/28/2013 0955   BUN 21 07/21/2013 1110   BUN 24* 04/28/2013 0955   CREATININE 1.3* 07/21/2013 1110   CREATININE 1.04 04/28/2013 0955      Component Value Date/Time   CALCIUM 9.4 07/21/2013 1110   CALCIUM 9.1 04/28/2013 0955   ALKPHOS 33 07/21/2013 1110    ALKPHOS 37* 04/28/2013 0955   AST 16 07/21/2013 1110   AST 10 04/28/2013 0955   ALT 22 07/21/2013 1110   ALT 18 04/28/2013 0955   BILITOT 0.60 07/21/2013 1110   BILITOT 0.3 04/28/2013 0955         Impression and Plan: Emily Greer is 63 year old [name]. She has recurrent lambda light chain myeloma. So far, and she's been doing okay. I suspect that we might have to consider another transplant for her. Her first transplant was probably in 12 years ago.  We will go ahead and do another bone survey on her. I will get this when she comes in for 20 far urine.  We will get her back in another couple months.  Hopefully,,we find that her urine light chains will continue to go down. If not, then I think we will have to consider adding Velcade or Kyprolis.  [name], MD 6/22/20156:51 PM

## 2013-07-21 NOTE — Progress Notes (Deleted)
Hematology and Oncology Follow Up Visit  Emily Greer [id number] [birth year] 63 y.o. 07/21/2013   Principle Diagnosis:   Recurrent lambda light chain myeloma  Localized-stage I-endometrial cancer  Current Therapy:    Pomalidomide/Biaxin/Decadron  Aredia 60 mg IV Q. month     Interim History:  [name] is ***  Medications: Current outpatient prescriptions:allopurinol (ZYLOPRIM) 100 MG tablet, Take 100 mg by mouth daily. , Disp: , Rfl: ;  aspirin EC 81 MG tablet, Take 81 mg by mouth daily. 2 tabs daily, Disp: , Rfl: ;  calcium carbonate (OS-CAL) 600 MG TABS, Take 600 mg by mouth daily with breakfast. , Disp: , Rfl: ;  clarithromycin (BIAXIN) 500 MG tablet, Take 1 tablet (500 mg total) by mouth 2 (two) times daily., Disp: 60 tablet, Rfl: 4 dexamethasone (DECADRON) 4 MG tablet, Take 4 mg by mouth. "Taking 3 tablets with meals every other week., Disp: , Rfl: ;  Ergocalciferol (VITAMIN D2) 2000 UNITS TABS, Take by mouth daily., Disp: , Rfl: ;  hydrochlorothiazide (MICROZIDE) 12.5 MG capsule, TAKE ONE CAPSULE BY MOUTH DAILY, Disp: 90 capsule, Rfl: 0;  loperamide (IMODIUM) 2 MG capsule, Take 2 mg by mouth as needed. For diarrhea, Disp: , Rfl:  loratadine (CLARITIN) 10 MG tablet, Take 10 mg by mouth every morning. For allergies during allergy season, Disp: , Rfl: ;  metoprolol (TOPROL-XL) 50 MG 24 hr tablet, Take 50 mg by mouth every morning. , Disp: , Rfl: ;  Olmesartan-Amlodipine-HCTZ 20-5-12.5 MG TABS, Take 1 tablet by mouth daily., Disp: , Rfl:  pomalidomide (POMALYST) 4 MG capsule, Take 1 capsule (4 mg total) by mouth daily. TAKE WITH WATER-- 21 DAYS ON & 7 DAYS OFF.Josem Kaufmann [id number], Disp: 21 capsule, Rfl: 0;  potassium chloride SA (K-DUR,KLOR-CON) 20 MEQ tablet, Take 1 tablet (20 mEq total) by mouth once., Disp: 10 tablet, Rfl: 0  Allergies:  Allergies  Allergen Reactions  . Codeine Nausea Only    Past Medical History, Surgical history, Social history, and Family History were  reviewed and updated.  Review of Systems: ***  Physical Exam:  height is 5\' 3"  (1.6 m) and weight is 212 lb (96.163 kg). Her oral temperature is 97.9 F (36.6 C). Her blood pressure is 120/60 and her pulse is 73. Her respiration is 16.   ***  Lab Results  Component Value Date   WBC 6.9 07/07/2013   HGB 10.4* 07/07/2013   HCT 31.1* 07/07/2013   MCV 81 07/07/2013   PLT 300 07/07/2013     Chemistry      Component Value Date/Time   NA 137 07/21/2013 1110   NA 140 04/28/2013 0955   K 3.1* 07/21/2013 1110   K 3.1* 04/28/2013 0955   CL 100 07/21/2013 1110   CL 103 04/28/2013 0955   CO2 30 07/21/2013 1110   CO2 28 04/28/2013 0955   BUN 21 07/21/2013 1110   BUN 24* 04/28/2013 0955   CREATININE 1.3* 07/21/2013 1110   CREATININE 1.04 04/28/2013 0955      Component Value Date/Time   CALCIUM 9.4 07/21/2013 1110   CALCIUM 9.1 04/28/2013 0955   ALKPHOS 33 07/21/2013 1110   ALKPHOS 37* 04/28/2013 0955   AST 16 07/21/2013 1110   AST 10 04/28/2013 0955   ALT 22 07/21/2013 1110   ALT 18 04/28/2013 0955   BILITOT 0.60 07/21/2013 1110   BILITOT 0.3 04/28/2013 0955         Impression and Plan: Emily Greer is ***   ENNEVER,PETER R,  MD 6/22/20156:36 PM

## 2013-07-22 ENCOUNTER — Telehealth: Payer: Self-pay | Admitting: *Deleted

## 2013-07-22 NOTE — Telephone Encounter (Addendum)
Message copied by Lenn Sink on Tue Jul 22, 2013  2:33 PM ------      Message from: Burney Gauze R      Created: Mon Jul 21, 2013  5:59 PM       Call - potassium is still low!!  Need to take 11mEq 2 times a day!!  [name] ------Left message informing pt that iron was low and she needed to take two of her 89mEq potassium pills a day. Informed pt to call us back and let us know she is doing this.

## 2013-08-07 ENCOUNTER — Other Ambulatory Visit (HOSPITAL_COMMUNITY)
Admission: RE | Admit: 2013-08-07 | Discharge: 2013-08-07 | Disposition: A | Discharge status: Home / Self Care | Payer: BC Managed Care – PPO | Source: Ambulatory Visit | Attending: Radiation Oncology | Admitting: Radiation Oncology

## 2013-08-07 ENCOUNTER — Encounter: Payer: Self-pay | Admitting: Radiation Oncology

## 2013-08-07 ENCOUNTER — Ambulatory Visit
Admission: RE | Admit: 2013-08-07 | Discharge: 2013-08-07 | Disposition: A | Discharge status: Home / Self Care | Payer: BC Managed Care – PPO | Source: Ambulatory Visit | Attending: Radiation Oncology | Admitting: Radiation Oncology

## 2013-08-07 VITALS — BP 157/79 | HR 87 | Temp 97.9°F | Ht 63.0 in | Wt 212.4 lb

## 2013-08-07 DIAGNOSIS — Z1151 Encounter for screening for human papillomavirus (HPV): Secondary | ICD-10-CM | POA: Insufficient documentation

## 2013-08-07 DIAGNOSIS — Z124 Encounter for screening for malignant neoplasm of cervix: Secondary | ICD-10-CM | POA: Insufficient documentation

## 2013-08-07 DIAGNOSIS — C541 Malignant neoplasm of endometrium: Secondary | ICD-10-CM

## 2013-08-07 NOTE — Progress Notes (Signed)
Radiation Oncology         [phone] ________________________________  Name: Emily Greer MRN: [id number]  Date: 08/07/2013  DOB: 08-Apr-1950  Follow-Up Visit Note  CC: Foye Spurling, MD  [name], MD  Diagnosis:   Recurrent endometrial cancer  Interval Since Last Radiation:  One year   Narrative:  The patient returns today for routine follow-up.  She continues to followup with medical oncology concerning her multiple myeloma.  She will occasionally have postcoital mild vaginal bleeding. She denies any vaginal bleeding at other times. Patient denies any pelvic pain or low back pain. She has been inconsistent with using her vaginal dilator and will work on improving this issue. Patient recently retired. She missed her appointment with gynecologic oncology.                              ALLERGIES:  is allergic to codeine.  Meds: Current Outpatient Prescriptions  Medication Sig Dispense Refill  . allopurinol (ZYLOPRIM) 100 MG tablet Take 100 mg by mouth daily.       [name] Kitchen aspirin EC 81 MG tablet Take 81 mg by mouth daily. 2 tabs daily      . calcium carbonate (OS-CAL) 600 MG TABS Take 600 mg by mouth daily with breakfast.       . clarithromycin (BIAXIN) 500 MG tablet Take 1 tablet (500 mg total) by mouth 2 (two) times daily.  60 tablet  4  . dexamethasone (DECADRON) 4 MG tablet Take 4 mg by mouth. "Taking 3 tablets with meals every other week.      . Ergocalciferol (VITAMIN D2) 2000 UNITS TABS Take by mouth daily.      . hydrochlorothiazide (MICROZIDE) 12.5 MG capsule TAKE ONE CAPSULE BY MOUTH DAILY  90 capsule  0  . loratadine (CLARITIN) 10 MG tablet Take 10 mg by mouth every morning. For allergies during allergy season      . metoprolol (TOPROL-XL) 50 MG 24 hr tablet Take 50 mg by mouth every morning.       . pomalidomide (POMALYST) 4 MG capsule Take 1 capsule (4 mg total) by mouth daily. TAKE WITH WATER-- 21 DAYS ON & 7 DAYS OFF.Auth #[id number]  21 capsule  0  . potassium  chloride SA (K-DUR,KLOR-CON) 20 MEQ tablet Take 1 tablet (20 mEq total) by mouth once.  10 tablet  0  . loperamide (IMODIUM) 2 MG capsule Take 2 mg by mouth as needed. For diarrhea       No current facility-administered medications for this encounter.    Physical Findings: The patient is in no acute distress. Patient is alert and oriented.  height is _0  (1.6 m) and weight is 212 lb 6.4 oz (96.344 kg). Her oral temperature is 97.9 F (36.6 C). Her blood pressure is 157/79 and her pulse is 87. [name] Kitchen  No palpable supraclavicular or axillary adenopathy. The lungs are clear to auscultation. The heart has a regular rhythm and rate. The abdomen is soft and nontender with normal bowel sounds. No inguinal adenopathy is appreciated. On pelvic examination the external genitalia are unremarkable. A speculum exam is performed. There are no mucosal lesions noted in the vaginal vault. A small speculum was used for the examination. A Pap smear was obtained of the proximal vagina. On bimanual and rectovaginal examination there no pelvic masses appreciated.  Lab Findings: Lab Results  Component Value Date   WBC 6.9 07/07/2013   HGB 10.4*  07/07/2013   HCT 31.1* 07/07/2013   MCV 81 07/07/2013   PLT 300 07/07/2013     Radiographic Findings: No results found.  Impression:  No evidence of recurrence on clinical exam today, Pap smear pending  Plan:  Routine followup in 6 months. In the interim the patient will be seen by gynecologic oncology.  ____________________________________ Blair Promise, MD

## 2013-08-07 NOTE — Progress Notes (Addendum)
Emily Greer here for follow up after treatment for endometrial cancer.  She denies pain.  She reports occasional burning with urination.  She also reports occasional diarrhea depending on what she eats.  She denies rectal bleeding.  She reports seeing slight blood after intercourse at times. She is taking decadron every other week for her multiple myeloma.

## 2013-08-11 LAB — CYTOLOGY - PAP

## 2013-08-12 ENCOUNTER — Other Ambulatory Visit: Payer: Self-pay | Admitting: *Deleted

## 2013-08-12 DIAGNOSIS — C9000 Multiple myeloma not having achieved remission: Secondary | ICD-10-CM

## 2013-08-12 MED ORDER — POMALIDOMIDE 4 MG PO CAPS: 4.0000 mg | ORAL_CAPSULE | Freq: Every day | ORAL | Status: DC

## 2013-08-18 ENCOUNTER — Other Ambulatory Visit: Payer: Self-pay | Admitting: Hematology & Oncology

## 2013-08-21 ENCOUNTER — Ambulatory Visit (HOSPITAL_BASED_OUTPATIENT_CLINIC_OR_DEPARTMENT_OTHER)
Admission: RE | Admit: 2013-08-21 | Discharge: 2013-08-21 | Disposition: A | Discharge status: Home / Self Care | Payer: BC Managed Care – PPO | Source: Ambulatory Visit | Attending: Hematology & Oncology | Admitting: Hematology & Oncology

## 2013-08-21 ENCOUNTER — Other Ambulatory Visit: Payer: BC Managed Care – PPO | Admitting: Lab

## 2013-08-21 DIAGNOSIS — C9000 Multiple myeloma not having achieved remission: Secondary | ICD-10-CM | POA: Insufficient documentation

## 2013-08-22 ENCOUNTER — Telehealth: Payer: Self-pay | Admitting: *Deleted

## 2013-08-22 NOTE — Telephone Encounter (Addendum)
Message copied by Lenn Sink on Fri Aug 22, 2013 11:18 AM ------      Message from: Burney Gauze R      Created: Thu Aug 21, 2013  6:12 PM       Please call and let her no that the bone survey did not show any new myeloma lesions. What she previously had are somewhat better. Pete ------Left voicemail letting her know that the bone survey did not show any new myeloma lesions. What she previously had are somewhat better.

## 2013-08-25 LAB — UIFE/LIGHT CHAINS/TP QN, 24-HR UR
Albumin, U: DETECTED
Alpha 1, Urine: DETECTED — AB
Alpha 2, Urine: DETECTED — AB
Beta, Urine: DETECTED — AB
Free Kappa Lt Chains,Ur: 1.77 mg/dL (ref 0.14–2.42)
Free Kappa/Lambda Ratio: 0.05 ratio — ABNORMAL LOW (ref 2.04–10.37)
Free Lambda Excretion/Day: 772.8 mg/d
Free Lambda Lt Chains,Ur: 36.8 mg/dL — ABNORMAL HIGH (ref 0.02–0.67)
Free Lt Chn Excr Rate: 37.17 mg/d
Gamma Globulin, Urine: DETECTED — AB
Time: 24 hours
Total Protein, Urine-Ur/day: 821 mg/d — ABNORMAL HIGH (ref 10–140)
Total Protein, Urine: 39.1 mg/dL
Volume, Urine: 2100 mL

## 2013-09-02 ENCOUNTER — Telehealth: Payer: Self-pay | Admitting: *Deleted

## 2013-09-02 NOTE — Telephone Encounter (Signed)
Called pt to schedule follow up with Dr. Skeet Latch. Message was left asking pt to call [phone] to schedule follow up appt

## 2013-09-04 ENCOUNTER — Encounter: Payer: Self-pay | Admitting: Hematology & Oncology

## 2013-09-04 ENCOUNTER — Ambulatory Visit (HOSPITAL_BASED_OUTPATIENT_CLINIC_OR_DEPARTMENT_OTHER): Payer: BC Managed Care – PPO | Admitting: Lab

## 2013-09-04 ENCOUNTER — Ambulatory Visit (HOSPITAL_BASED_OUTPATIENT_CLINIC_OR_DEPARTMENT_OTHER): Payer: BC Managed Care – PPO | Admitting: Hematology & Oncology

## 2013-09-04 ENCOUNTER — Ambulatory Visit (HOSPITAL_BASED_OUTPATIENT_CLINIC_OR_DEPARTMENT_OTHER): Payer: BC Managed Care – PPO

## 2013-09-04 VITALS — BP 170/74 | HR 71 | Temp 97.9°F | Resp 14 | Ht 63.0 in | Wt 215.0 lb

## 2013-09-04 DIAGNOSIS — Z8579 Personal history of other malignant neoplasms of lymphoid, hematopoietic and related tissues: Secondary | ICD-10-CM

## 2013-09-04 DIAGNOSIS — C9000 Multiple myeloma not having achieved remission: Secondary | ICD-10-CM

## 2013-09-04 LAB — CBC WITH DIFFERENTIAL (CANCER CENTER ONLY)
BASO#: 0.1 10*3/uL (ref 0.0–0.2)
BASO%: 1.4 % (ref 0.0–2.0)
EOS%: 5.9 % (ref 0.0–7.0)
Eosinophils Absolute: 0.4 10*3/uL (ref 0.0–0.5)
HCT: 33.4 % — ABNORMAL LOW (ref 34.8–46.6)
HGB: 11 g/dL — ABNORMAL LOW (ref 11.6–15.9)
LYMPH#: 1.3 10*3/uL (ref 0.9–3.3)
LYMPH%: 19.3 % (ref 14.0–48.0)
MCH: 26.5 pg (ref 26.0–34.0)
MCHC: 32.9 g/dL (ref 32.0–36.0)
MCV: 81 fL (ref 81–101)
MONO#: 0.7 10*3/uL (ref 0.1–0.9)
MONO%: 11.2 % (ref 0.0–13.0)
NEUT#: 4.1 10*3/uL (ref 1.5–6.5)
NEUT%: 62.2 % (ref 39.6–80.0)
Platelets: 257 10*3/uL (ref 145–400)
RBC: 4.15 10*6/uL (ref 3.70–5.32)
RDW: 17.1 % — ABNORMAL HIGH (ref 11.1–15.7)
WBC: 6.6 10*3/uL (ref 3.9–10.0)

## 2013-09-04 LAB — COMPREHENSIVE METABOLIC PANEL (CC13)
ALT: 17 U/L (ref 0–55)
AST: 12 U/L (ref 5–34)
Albumin: 3.6 g/dL (ref 3.5–5.0)
Alkaline Phosphatase: 45 U/L (ref 40–150)
Anion Gap: 9 mEq/L (ref 3–11)
BUN: 18.3 mg/dL (ref 7.0–26.0)
CO2: 27 mEq/L (ref 22–29)
Calcium: 9.8 mg/dL (ref 8.4–10.4)
Chloride: 105 mEq/L (ref 98–109)
Creatinine: 0.9 mg/dL (ref 0.6–1.1)
Glucose: 81 mg/dl (ref 70–140)
Potassium: 4 mEq/L (ref 3.5–5.1)
Sodium: 141 mEq/L (ref 136–145)
Total Bilirubin: 0.47 mg/dL (ref 0.20–1.20)
Total Protein: 6.1 g/dL — ABNORMAL LOW (ref 6.4–8.3)

## 2013-09-04 MED ORDER — PAMIDRONATE DISODIUM 90 MG/10ML IV SOLN
90.0000 mg | Freq: Once | INTRAVENOUS | Status: AC
  Administered 2013-09-04: 90 mg via INTRAVENOUS
  Filled 2013-09-04: qty 10

## 2013-09-04 NOTE — Patient Instructions (Signed)
Pamidronate injection What is this medicine? PAMIDRONATE (pa mi DROE nate) slows calcium loss from bones. It is used to treat high calcium blood levels from cancer or Paget's disease. It is also used to treat bone pain and prevent fractures from certain cancers that have spread to the bone. This medicine may be used for other purposes; ask your health care provider or pharmacist if you have questions. COMMON BRAND NAME(S): Aredia What should I tell my health care provider before I take this medicine? They need to know if you have any of these conditions: -aspirin-sensitive asthma -dental disease -kidney disease -an unusual or allergic reaction to pamidronate, other medicines, foods, dyes, or preservatives -pregnant or trying to get pregnant -breast-feeding How should I use this medicine? This medicine is for infusion into a vein. It is given by a health care professional in a hospital or clinic setting. Talk to your pediatrician regarding the use of this medicine in children. This medicine is not approved for use in children. Overdosage: If you think you have taken too much of this medicine contact a poison control center or emergency room at once. NOTE: This medicine is only for you. Do not share this medicine with others. What if I miss a dose? This does not apply. What may interact with this medicine? -certain antibiotics given by injection -medicines for inflammation or pain like ibuprofen, naproxen -some diuretics like bumetanide, furosemide -cyclosporine -parathyroid hormone -tacrolimus -teriparatide -thalidomide This list may not describe all possible interactions. Give your health care provider a list of all the medicines, herbs, non-prescription drugs, or dietary supplements you use. Also tell them if you smoke, drink alcohol, or use illegal drugs. Some items may interact with your medicine. What should I watch for while using this medicine? Visit your doctor or health care  professional for regular checkups. It may be some time before you see the benefit from this medicine. Do not stop taking your medicine unless your doctor tells you to. Your doctor may order blood tests or other tests to see how you are doing. Women should inform their doctor if they wish to become pregnant or think they might be pregnant. There is a potential for serious side effects to an unborn child. Talk to your health care professional or pharmacist for more information. You should make sure that you get enough calcium and vitamin D while you are taking this medicine. Discuss the foods you eat and the vitamins you take with your health care professional. Some people who take this medicine have severe bone, joint, and/or muscle pain. This medicine may also increase your risk for a broken thigh bone. Tell your doctor right away if you have pain in your upper leg or groin. Tell your doctor if you have any pain that does not go away or that gets worse. What side effects may I notice from receiving this medicine? Side effects that you should report to your doctor or health care professional as soon as possible: -allergic reactions like skin rash, itching or hives, swelling of the face, lips, or tongue -black or tarry stools -changes in vision -eye inflammation, pain -high blood pressure -jaw pain, especially burning or cramping -muscle weakness -numb, tingling pain -swelling of feet or hands -trouble passing urine or change in the amount of urine -unable to move easily Side effects that usually do not require medical attention (report to your doctor or health care professional if they continue or are bothersome): -bone, joint, or muscle pain -constipation -dizzy, drowsy -  fever -headache -loss of appetite -nausea, vomiting -pain at site where injected This list may not describe all possible side effects. Call your doctor for medical advice about side effects. You may report side effects to  FDA at 1-800-FDA-1088. Where should I keep my medicine? This drug is given in a hospital or clinic and will not be stored at home. NOTE: This sheet is a summary. It may not cover all possible information. If you have questions about this medicine, talk to your doctor, pharmacist, or health care provider.  2015, [employer]vier/Gold Standard. (2010-07-15 08:49:49)

## 2013-09-05 NOTE — Progress Notes (Signed)
Hematology and Oncology Follow Up Visit  LURANA KOERNER [id number] 1950-05-30 63 y.o. 09/05/2013   Principle Diagnosis:   Recurrent lambda likely myeloma  Localized stage I endometrial carcinoma  Current Therapy:    Pomalidomide/Biaxin/Decadron  Aredia 60 mg IV q. month     Interim History:  Ms.  Peine is back for followup. She's now retired. She's enjoy herself. Her husband, hopefully will retire also.  She's had no problems with pain. She's really bothered by weight gain. We have her off steroids.  She's had no problems with bowels or bladder. She's had no nausea vomiting. She's had no cough. There's been no headache. She's had no rashes. There's been some occasional leg swelling.  We did a 24-hour urine on her. Unfortunately, start a see a more gradual rise in the lambda light chains. On July 23, the 24-hour urine excretion was 773 mg of light chain. Prior to that, in June, the lambda light show 351 mg.  She's had no problems with fever. She's had no visual problems. Exam no headache.  Medications: Current outpatient prescriptions:allopurinol (ZYLOPRIM) 100 MG tablet, Take 100 mg by mouth daily. , Disp: , Rfl: ;  aspirin EC 81 MG tablet, Take 81 mg by mouth daily. 2 tabs daily, Disp: , Rfl: ;  calcium carbonate (OS-CAL) 600 MG TABS, Take 600 mg by mouth daily with breakfast. , Disp: , Rfl: ;  clarithromycin (BIAXIN) 500 MG tablet, Take 1 tablet (500 mg total) by mouth 2 (two) times daily., Disp: 60 tablet, Rfl: 4 dexamethasone (DECADRON) 4 MG tablet, Take 4 mg by mouth. "Taking 3 tablets with meals every other week., Disp: , Rfl: ;  Ergocalciferol (VITAMIN D2) 2000 UNITS TABS, Take by mouth daily., Disp: , Rfl: ;  hydrochlorothiazide (MICROZIDE) 12.5 MG capsule, TAKE ONE CAPSULE BY MOUTH DAILY, Disp: 90 capsule, Rfl: 0;  loperamide (IMODIUM) 2 MG capsule, Take 2 mg by mouth as needed. For diarrhea, Disp: , Rfl:  loratadine (CLARITIN) 10 MG tablet, Take 10 mg by mouth every  morning. For allergies during allergy season, Disp: , Rfl: ;  metoprolol (TOPROL-XL) 50 MG 24 hr tablet, Take 50 mg by mouth every morning. , Disp: , Rfl: ;  pomalidomide (POMALYST) 4 MG capsule, Take 1 capsule (4 mg total) by mouth daily. TAKE WITH WATER-- 21 DAYS ON & 7 DAYS OFF.Josem Kaufmann BY:[id number], Disp: 21 capsule, Rfl: 0 potassium chloride SA (K-DUR,KLOR-CON) 20 MEQ tablet, Take 1 tablet (20 mEq total) by mouth once., Disp: 10 tablet, Rfl: 0  Allergies:  Allergies  Allergen Reactions  . Codeine Nausea Only    Past Medical History, Surgical history, Social history, and Family History were reviewed and updated.  Review of Systems: As above  Physical Exam:  height is 5\' 3"  (1.6 m) and weight is 215 lb (97.523 kg). Her oral temperature is 97.9 F (36.6 C). Her blood pressure is 170/74 and her pulse is 71. Her respiration is 14.   Well-developed and well-nourished African American female. Head and exam has no ocular or oral lesions. She has no palpable cervical or supraclavicular lymph nodes. Lungs are clear bilaterally. Cardiac exam regular rate and rhythm with no murmurs rubs or bruits. Abdomen is soft. She has good bowel sounds. There is no fluid wave. There is no palpable liver or spleen tip. Extremities shows no clubbing cyanosis or edema. Skin exam no rashes, ecchymoses or petechia. Neurological exam is nonfocal.  Lab Results  Component Value Date   WBC 6.6 09/04/2013   HGB  11.0* 09/04/2013   HCT 33.4* 09/04/2013   MCV 81 09/04/2013   PLT 257 09/04/2013     Chemistry      Component Value Date/Time   NA 141 09/04/2013 1049   NA 137 07/21/2013 1110   NA 140 04/28/2013 0955   K 4.0 09/04/2013 1049   K 3.1* 07/21/2013 1110   K 3.1* 04/28/2013 0955   CL 100 07/21/2013 1110   CL 103 04/28/2013 0955   CO2 27 09/04/2013 1049   CO2 30 07/21/2013 1110   CO2 28 04/28/2013 0955   BUN 18.3 09/04/2013 1049   BUN 21 07/21/2013 1110   BUN 24* 04/28/2013 0955   CREATININE 0.9 09/04/2013 1049   CREATININE 1.3*  07/21/2013 1110   CREATININE 1.04 04/28/2013 0955      Component Value Date/Time   CALCIUM 9.8 09/04/2013 1049   CALCIUM 9.4 07/21/2013 1110   CALCIUM 9.1 04/28/2013 0955   ALKPHOS 45 09/04/2013 1049   ALKPHOS 33 07/21/2013 1110   ALKPHOS 37* 04/28/2013 0955   AST 12 09/04/2013 1049   AST 16 07/21/2013 1110   AST 10 04/28/2013 0955   ALT 17 09/04/2013 1049   ALT 22 07/21/2013 1110   ALT 18 04/28/2013 0955   BILITOT 0.47 09/04/2013 1049   BILITOT 0.60 07/21/2013 1110   BILITOT 0.3 04/28/2013 0955         Impression and Plan: Ms. Leclaire is 63 year old [name]. She has lambda light chain myeloma. She had her transplant probably 12 or 13 years ago.  I just am worried about the light chain going up. I will have to repeat the 24-hour urine in about a month. If he still see that the light chain is going up, then we will have to make a change. I would add Kyprolis to the Pomalidomide. I would then see what her response we and then try to get her over to transplant.  We will go ahead with the Aredia today.  I will see her back in about 5 weeks. We will do Aredia then.   [name], MD 8/7/20157:51 AM

## 2013-09-08 LAB — PROTEIN ELECTROPHORESIS, SERUM, WITH REFLEX
Albumin ELP: 62 % (ref 55.8–66.1)
Alpha-1-Globulin: 4.2 % (ref 2.9–4.9)
Alpha-2-Globulin: 11.7 % (ref 7.1–11.8)
Beta 2: 5.4 % (ref 3.2–6.5)
Beta Globulin: 8.7 % — ABNORMAL HIGH (ref 4.7–7.2)
Gamma Globulin: 8 % — ABNORMAL LOW (ref 11.1–18.8)
M-Spike, %: 0.1 g/dL
Total Protein, Serum Electrophoresis: 5.8 g/dL — ABNORMAL LOW (ref 6.0–8.3)

## 2013-09-08 LAB — KAPPA/LAMBDA LIGHT CHAINS
Kappa free light chain: 0.66 mg/dL (ref 0.33–1.94)
Kappa:Lambda Ratio: 0.03 — ABNORMAL LOW (ref 0.26–1.65)
Lambda Free Lght Chn: 22.8 mg/dL — ABNORMAL HIGH (ref 0.57–2.63)

## 2013-09-08 LAB — IGG, IGA, IGM
IgA: 61 mg/dL — ABNORMAL LOW (ref 69–380)
IgG (Immunoglobin G), Serum: 471 mg/dL — ABNORMAL LOW (ref 690–1700)
IgM, Serum: 17 mg/dL — ABNORMAL LOW (ref 52–322)

## 2013-09-08 LAB — IFE INTERPRETATION

## 2013-09-08 LAB — LACTATE DEHYDROGENASE: LDH: 171 U/L (ref 94–250)

## 2013-09-10 ENCOUNTER — Other Ambulatory Visit: Payer: Self-pay | Admitting: *Deleted

## 2013-09-10 DIAGNOSIS — C9000 Multiple myeloma not having achieved remission: Secondary | ICD-10-CM

## 2013-09-10 MED ORDER — POMALIDOMIDE 4 MG PO CAPS: 4.0000 mg | ORAL_CAPSULE | Freq: Every day | ORAL | Status: DC

## 2013-09-12 ENCOUNTER — Other Ambulatory Visit: Payer: Self-pay | Admitting: Hematology & Oncology

## 2013-10-02 ENCOUNTER — Ambulatory Visit (HOSPITAL_BASED_OUTPATIENT_CLINIC_OR_DEPARTMENT_OTHER): Payer: BC Managed Care – PPO | Admitting: Lab

## 2013-10-02 DIAGNOSIS — C9000 Multiple myeloma not having achieved remission: Secondary | ICD-10-CM

## 2013-10-02 LAB — CBC WITH DIFFERENTIAL (CANCER CENTER ONLY)
BASO#: 0 10*3/uL (ref 0.0–0.2)
BASO%: 0.2 % (ref 0.0–2.0)
EOS%: 0.2 % (ref 0.0–7.0)
Eosinophils Absolute: 0 10*3/uL (ref 0.0–0.5)
HCT: 30.5 % — ABNORMAL LOW (ref 34.8–46.6)
HGB: 10.2 g/dL — ABNORMAL LOW (ref 11.6–15.9)
LYMPH#: 0.7 10*3/uL — ABNORMAL LOW (ref 0.9–3.3)
LYMPH%: 8.3 % — ABNORMAL LOW (ref 14.0–48.0)
MCH: 26.7 pg (ref 26.0–34.0)
MCHC: 33.4 g/dL (ref 32.0–36.0)
MCV: 80 fL — ABNORMAL LOW (ref 81–101)
MONO#: 0.7 10*3/uL (ref 0.1–0.9)
MONO%: 8.3 % (ref 0.0–13.0)
NEUT#: 6.8 10*3/uL — ABNORMAL HIGH (ref 1.5–6.5)
NEUT%: 83 % — ABNORMAL HIGH (ref 39.6–80.0)
Platelets: 251 10*3/uL (ref 145–400)
RBC: 3.82 10*6/uL (ref 3.70–5.32)
RDW: 17.6 % — ABNORMAL HIGH (ref 11.1–15.7)
WBC: 8.2 10*3/uL (ref 3.9–10.0)

## 2013-10-02 LAB — BASIC METABOLIC PANEL
BUN: 19 mg/dL (ref 6–23)
CO2: 26 mEq/L (ref 19–32)
Calcium: 9.7 mg/dL (ref 8.4–10.5)
Chloride: 105 mEq/L (ref 96–112)
Creatinine, Ser: 0.97 mg/dL (ref 0.50–1.10)
Glucose, Bld: 116 mg/dL — ABNORMAL HIGH (ref 70–99)
Potassium: 3.4 mEq/L — ABNORMAL LOW (ref 3.5–5.3)
Sodium: 142 mEq/L (ref 135–145)

## 2013-10-07 ENCOUNTER — Encounter: Payer: Self-pay | Admitting: Hematology & Oncology

## 2013-10-07 ENCOUNTER — Ambulatory Visit (HOSPITAL_BASED_OUTPATIENT_CLINIC_OR_DEPARTMENT_OTHER): Payer: BC Managed Care – PPO | Admitting: Hematology & Oncology

## 2013-10-07 VITALS — BP 136/60 | HR 91 | Temp 98.4°F | Resp 16 | Ht 63.0 in | Wt 212.0 lb

## 2013-10-07 DIAGNOSIS — C9000 Multiple myeloma not having achieved remission: Secondary | ICD-10-CM

## 2013-10-07 LAB — PROTEIN ELECTROPHORESIS, SERUM, WITH REFLEX
Albumin ELP: 64.1 % (ref 55.8–66.1)
Alpha-1-Globulin: 4.3 % (ref 2.9–4.9)
Alpha-2-Globulin: 11.5 % (ref 7.1–11.8)
Beta 2: 4.8 % (ref 3.2–6.5)
Beta Globulin: 8.2 % — ABNORMAL HIGH (ref 4.7–7.2)
Gamma Globulin: 7.1 % — ABNORMAL LOW (ref 11.1–18.8)
M-Spike, %: 0.1 g/dL
Total Protein, Serum Electrophoresis: 6 g/dL (ref 6.0–8.3)

## 2013-10-07 LAB — IFE INTERPRETATION

## 2013-10-07 LAB — UIFE/LIGHT CHAINS/TP QN, 24-HR UR
Albumin, U: DETECTED
Alpha 1, Urine: DETECTED — AB
Alpha 2, Urine: DETECTED — AB
Beta, Urine: DETECTED — AB
Gamma Globulin, Urine: DETECTED — AB
Time: 24 hours
Total Protein, Urine-Ur/day: 901 mg/d — ABNORMAL HIGH (ref <150)
Total Protein, Urine: 53 mg/dL — ABNORMAL HIGH (ref 5–24)
Volume, Urine: 1700 mL

## 2013-10-07 LAB — IGG, IGA, IGM
IgA: 53 mg/dL — ABNORMAL LOW (ref 69–380)
IgG (Immunoglobin G), Serum: 399 mg/dL — ABNORMAL LOW (ref 690–1700)
IgM, Serum: 12 mg/dL — ABNORMAL LOW (ref 52–322)

## 2013-10-08 ENCOUNTER — Other Ambulatory Visit: Payer: Self-pay | Admitting: Hematology & Oncology

## 2013-10-08 DIAGNOSIS — C9000 Multiple myeloma not having achieved remission: Secondary | ICD-10-CM

## 2013-10-08 MED ORDER — POMALIDOMIDE 4 MG PO CAPS: 4.0000 mg | ORAL_CAPSULE | Freq: Every day | ORAL | Status: DC

## 2013-10-08 NOTE — Progress Notes (Signed)
Hematology and Oncology Follow Up Visit  Emily Greer [id number] 1950-07-13 63 y.o. 10/08/2013   Principle Diagnosis:   Recurrent lambda light chain myeloma  History of stage I endometrial carcinoma  Current Therapy:    Pomalidomide/Biaxin/Decadron  Aredia 60 mg IV Q. month     Interim History:  [name] is back for followup. She feels well. He looks great. She is now retired. 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  She and her husband are getting ready to go to [place] next week. She is looking forward to this. I am so happy that she will be able to enjoy it.  Unfortunately, it looks as if her myeloma is trying to become more resilient. Back in July, her 24-hour urine showed 773 mg per day of lambda light chain. I went ahead and repeated one on her today. The total protein is up higher. We will have to see what her life changes.  Again, have to suspect that her myeloma is becoming more resilient. As such, I will add Velcade to the Pomalidomide.  They were going to have to strongly consider a bone marrow transplant on her. Her initial transplant was done back 15 years ago. She is a, got a lot of mileage out of this. I'm sure that she probably has a cells that are saved.  She's not hurting. I didn't do a bone survey on her back in July. No new lesions were noted. She has some lytic lesions in the skull, left scapula and left ileum. They appear to be less visualized. I suppose we could probably consider a PET scan for her.  Overall, her performance status is ECOG 1 Medications: Allergies:  Allergies  Allergen Reactions  . Codeine Nausea Only    Past Medical History, Surgical history, Social history, and Family History were reviewed and updated.  Review of Systems: As above  Physical Exam:  height is $RemoveB'5\' 3"'mmqAeMpx$  (1.6 m) and weight is 212 lb (96.163 kg). Her oral temperature is 98.4 F (36.9 C). Her blood pressure is 136/60 and her pulse  is 91. Her respiration is 16.   Well-developed and well-nourished [name]. Head and neck exam shows no ocular or oral lesion. There are no palpable cervical or supraclavicular lymph nodes. Lungs are clear. Cardiac exam regular rate and rhythm with no murmurs rubs or bruits. Abdomen is soft. She has good bowel sounds. There is no fluid wave. There is no palpable liver or spleen tip. Extremities shows no clubbing, cyanosis or edema. Neurological exam shows no focal neurological deficits. Skin exam no rashes, ecchymosis or petechia.  Lab Results  Component Value Date   WBC 8.2 10/02/2013   HGB 10.2* 10/02/2013   HCT 30.5* 10/02/2013   MCV 80* 10/02/2013   PLT 251 10/02/2013     Chemistry      Component Value Date/Time   NA 142 10/02/2013 0925   NA 141 09/04/2013 1049   NA 137 07/21/2013 1110   K 3.4* 10/02/2013 0925   K 4.0 09/04/2013 1049   K 3.1* 07/21/2013 1110   CL 105 10/02/2013 0925   CL 100 07/21/2013 1110   CO2 26 10/02/2013 0925   CO2 27 09/04/2013 1049   CO2 30 07/21/2013 1110   BUN 19 10/02/2013 0925   BUN 18.3 09/04/2013 1049   BUN 21 07/21/2013 1110   CREATININE 0.97 10/02/2013 0925   CREATININE 0.9 09/04/2013 1049   CREATININE  1.3* 07/21/2013 1110      Component Value Date/Time   CALCIUM 9.7 10/02/2013 0925   CALCIUM 9.8 09/04/2013 1049   CALCIUM 9.4 07/21/2013 1110   ALKPHOS 45 09/04/2013 1049   ALKPHOS 33 07/21/2013 1110   ALKPHOS 37* 04/28/2013 0955   AST 12 09/04/2013 1049   AST 16 07/21/2013 1110   AST 10 04/28/2013 0955   ALT 17 09/04/2013 1049   ALT 22 07/21/2013 1110   ALT 18 04/28/2013 0955   BILITOT 0.47 09/04/2013 1049   BILITOT 0.60 07/21/2013 1110   BILITOT 0.3 04/28/2013 0955         Impression and Plan: Emily Greer is 63 year old female. She is recurrent lambda light chain myeloma.  What they were going to have to reevaluate her. I think she will need a bone marrow biopsy. I want to try to see what her cytogenetics are.  I will start her on a Velcade. I will add this to the  Pomalidomide. I think this would be very reasonable.  I talked to her about this. I find her some of the side effects. She understands.  Again, I want to try to get her into some kind of remission so that we can consider her for another stem cell transplant.  We will start her when she is back from [place].   [name], MD 9/9/20157:23 AM

## 2013-10-09 ENCOUNTER — Encounter: Payer: Self-pay | Admitting: Oncology

## 2013-10-09 NOTE — Progress Notes (Signed)
Explained upcoming schedule to pt and also put copy in the mail.  Explained that Velcade is an injection and the Aredia will take approximately 4 hours.

## 2013-10-13 ENCOUNTER — Telehealth: Payer: Self-pay | Admitting: Gynecologic Oncology

## 2013-10-13 NOTE — Telephone Encounter (Signed)
Returned phone call to patient.  Left message to call back.

## 2013-10-14 ENCOUNTER — Other Ambulatory Visit: Payer: Self-pay | Admitting: *Deleted

## 2013-10-14 MED ORDER — POTASSIUM CHLORIDE CRYS ER 20 MEQ PO TBCR: 20.0000 meq | EXTENDED_RELEASE_TABLET | Freq: Every day | ORAL | Status: DC

## 2013-10-17 ENCOUNTER — Ambulatory Visit: Payer: BC Managed Care – PPO | Admitting: Hematology & Oncology

## 2013-10-23 ENCOUNTER — Encounter (HOSPITAL_COMMUNITY): Payer: Self-pay | Admitting: Pharmacy Technician

## 2013-10-25 ENCOUNTER — Other Ambulatory Visit: Payer: Self-pay | Admitting: Radiology

## 2013-10-28 ENCOUNTER — Ambulatory Visit (HOSPITAL_COMMUNITY)
Admission: RE | Admit: 2013-10-28 | Discharge: 2013-10-28 | Disposition: A | Discharge status: Home / Self Care | Payer: BC Managed Care – PPO | Source: Ambulatory Visit | Attending: Hematology & Oncology | Admitting: Hematology & Oncology

## 2013-10-28 ENCOUNTER — Telehealth: Payer: Self-pay | Admitting: Gynecologic Oncology

## 2013-10-28 ENCOUNTER — Encounter (HOSPITAL_COMMUNITY): Payer: Self-pay

## 2013-10-28 DIAGNOSIS — D4989 Neoplasm of unspecified behavior of other specified sites: Secondary | ICD-10-CM | POA: Diagnosis not present

## 2013-10-28 DIAGNOSIS — D509 Iron deficiency anemia, unspecified: Secondary | ICD-10-CM | POA: Diagnosis not present

## 2013-10-28 DIAGNOSIS — Z923 Personal history of irradiation: Secondary | ICD-10-CM | POA: Diagnosis not present

## 2013-10-28 DIAGNOSIS — C9000 Multiple myeloma not having achieved remission: Secondary | ICD-10-CM | POA: Insufficient documentation

## 2013-10-28 LAB — CBC WITH DIFFERENTIAL/PLATELET
Basophils Absolute: 0 10*3/uL (ref 0.0–0.1)
Basophils Relative: 0 % (ref 0–1)
Eosinophils Absolute: 0.1 10*3/uL (ref 0.0–0.7)
Eosinophils Relative: 1 % (ref 0–5)
HCT: 29.4 % — ABNORMAL LOW (ref 36.0–46.0)
Hemoglobin: 9.8 g/dL — ABNORMAL LOW (ref 12.0–15.0)
Lymphocytes Relative: 24 % (ref 12–46)
Lymphs Abs: 1.2 10*3/uL (ref 0.7–4.0)
MCH: 26.1 pg (ref 26.0–34.0)
MCHC: 33.3 g/dL (ref 30.0–36.0)
MCV: 78.2 fL (ref 78.0–100.0)
Monocytes Absolute: 1.4 10*3/uL — ABNORMAL HIGH (ref 0.1–1.0)
Monocytes Relative: 28 % — ABNORMAL HIGH (ref 3–12)
Neutro Abs: 2.3 10*3/uL (ref 1.7–7.7)
Neutrophils Relative %: 47 % (ref 43–77)
Platelets: 299 10*3/uL (ref 150–400)
RBC: 3.76 MIL/uL — ABNORMAL LOW (ref 3.87–5.11)
RDW: 17.8 % — ABNORMAL HIGH (ref 11.5–15.5)
WBC: 5 10*3/uL (ref 4.0–10.5)

## 2013-10-28 LAB — PROTIME-INR
INR: 0.93 (ref 0.00–1.49)
Prothrombin Time: 12.5 seconds (ref 11.6–15.2)

## 2013-10-28 LAB — APTT: aPTT: <20 seconds — ABNORMAL LOW (ref 24–37)

## 2013-10-28 LAB — BONE MARROW EXAM

## 2013-10-28 MED ORDER — FENTANYL CITRATE 0.05 MG/ML IJ SOLN
INTRAMUSCULAR | Status: AC | PRN
  Administered 2013-10-28: 25 ug via INTRAVENOUS
  Administered 2013-10-28: 50 ug via INTRAVENOUS

## 2013-10-28 MED ORDER — MIDAZOLAM HCL 2 MG/2ML IJ SOLN
INTRAMUSCULAR | Status: AC
  Filled 2013-10-28: qty 6

## 2013-10-28 MED ORDER — SODIUM CHLORIDE 0.9 % IV SOLN
INTRAVENOUS | Status: DC
  Administered 2013-10-28: 08:00:00 via INTRAVENOUS

## 2013-10-28 MED ORDER — FENTANYL CITRATE 0.05 MG/ML IJ SOLN
INTRAMUSCULAR | Status: AC
  Filled 2013-10-28: qty 6

## 2013-10-28 MED ORDER — MIDAZOLAM HCL 2 MG/2ML IJ SOLN
INTRAMUSCULAR | Status: AC | PRN
  Administered 2013-10-28: 1 mg via INTRAVENOUS
  Administered 2013-10-28 (×2): 0.5 mg via INTRAVENOUS
  Administered 2013-10-28: 1 mg via INTRAVENOUS

## 2013-10-28 NOTE — Procedures (Signed)
Interventional Radiology Procedure Note  Procedure: Ct guided bone marrow biopsy. Aspirate and 11G core biopsy  Complications: No immediate Recommendations:  Observe for 1 hour.  Routine dressing care   Signed,  Dulcy Fanny. Earleen Newport, DO

## 2013-10-28 NOTE — Discharge Instructions (Signed)
Conscious Sedation, Adult, Care After °Refer to this sheet in the next few weeks. These instructions provide you with information on caring for yourself after your procedure. Your health care provider may also give you more specific instructions. Your treatment has been planned according to current medical practices, but problems sometimes occur. Call your health care provider if you have any problems or questions after your procedure. °WHAT TO EXPECT AFTER THE PROCEDURE  °After your procedure: °· You may feel sleepy, clumsy, and have poor balance for several hours. °· Vomiting may occur if you eat too soon after the procedure. °HOME CARE INSTRUCTIONS °· Do not participate in any activities where you could become injured for at least 24 hours. Do not: °· Drive. °· Swim. °· Ride a bicycle. °· Operate heavy machinery. °· Cook. °· Use power tools. °· Climb ladders. °· Work from a high place. °· Do not make important decisions or sign legal documents until you are improved. °· If you vomit, drink water, juice, or soup when you can drink without vomiting. Make sure you have little or no nausea before eating solid foods. °· Only take over-the-counter or prescription medicines for pain, discomfort, or fever as directed by your health care provider. °· Make sure you and your family fully understand everything about the medicines given to you, including what side effects may occur. °· You should not drink alcohol, take sleeping pills, or take medicines that cause drowsiness for at least 24 hours. °· If you smoke, do not smoke without supervision. °· If you are feeling better, you may resume normal activities 24 hours after you were sedated. °· Keep all appointments with your health care provider. °SEEK MEDICAL CARE IF: °· Your skin is pale or bluish in color. °· You continue to feel nauseous or vomit. °· Your pain is getting worse and is not helped by medicine. °· You have bleeding or swelling. °· You are still sleepy or  feeling clumsy after 24 hours. °SEEK IMMEDIATE MEDICAL CARE IF: °· You develop a rash. °· You have difficulty breathing. °· You develop any type of allergic problem. °· You have a fever. °MAKE SURE YOU: °· Understand these instructions. °· Will watch your condition. °· Will get help right away if you are not doing well or get worse. °Document Released: 11/06/2012 Document Reviewed: 11/06/2012 °[employer]® Patient Information ©2015 [employer]. This information is not intended to replace advice given to you by your health care provider. Make sure you discuss any questions you have with your health care provider. ° °Bone Marrow Aspiration, Bone Marrow Biopsy °Care After °Read the instructions outlined below and refer to this sheet in the next few weeks. These discharge instructions provide you with general information on caring for yourself after you leave the hospital. Your caregiver may also give you specific instructions. While your treatment has been planned according to the most current medical practices available, unavoidable complications occasionally occur. If you have any problems or questions after discharge, call your caregiver. °FINDING OUT THE RESULTS OF YOUR TEST °Not all test results are available during your visit. If your test results are not back during the visit, make an appointment with your caregiver to find out the results. Do not assume everything is normal if you have not heard from your caregiver or the medical facility. It is important for you to follow up on all of your test results.  °HOME CARE INSTRUCTIONS  °You have had sedation and may be sleepy or dizzy. Your thinking   may not be as clear as usual. For the next 24 hours: °· Only take over-the-counter or prescription medicines for pain, discomfort, and or fever as directed by your caregiver. °· Do not drink alcohol. °· Do not smoke. °· Do not drive. °· Do not make important legal decisions. °· Do not operate heavy machinery. °· Do not  care for small children by yourself. °· Keep your dressing clean and dry. You may replace dressing with a bandage after 24 hours. °· You may take a bath or shower after 24 hours. °· Use an ice pack for 20 minutes every 2 hours while awake for pain as needed. °SEEK MEDICAL CARE IF:  °· There is redness, swelling, or increasing pain at the biopsy site. °· There is pus coming from the biopsy site. °· There is drainage from a biopsy site lasting longer than one day. °· An unexplained oral temperature above 102° F (38.9° C) develops. °SEEK IMMEDIATE MEDICAL CARE IF:  °· You develop a rash. °· You have difficulty breathing. °· You develop any reaction or side effects to medications given. °Document Released: 08/05/2004 Document Revised: 04/10/2011 Document Reviewed: 01/14/2008 °[employer]® Patient Information ©2015 [employer]. This information is not intended to replace advice given to you by your health care provider. Make sure you discuss any questions you have with your health care provider. ° °

## 2013-10-28 NOTE — Telephone Encounter (Signed)
Office Visit:  GYN ONCOLOGY   CC:   Endometrial cancer surveillance   Assessment:   63 y.o.  year old with Stage1A Grade 2 endometrioid endometrial cancer staged 10/2011 with recurrence at the distal vagina identified in April 2014.  Subsequent PET scan was negative for evidence of metastatic disease and Emily Greer  completed vaginal  brachytherapy 07/29/2012  Patient's been advised to follow-up with Dr. Sondra Come January 2015 Followup with Dr. Kendall Flack April 2016 Followup with GYN oncology July 2016     HPI:  Emily Greer is a 63 y.o. year old G3P2 initially seen in consultation on 10/05/2011  grade 1  endometrial cancer  She then underwent a  total abdominal hysterectomy bilateral salpingo-oophorectomy bilateral pelvic lymph node dissection on  67/89/3810 without complications.  Her postoperative course was  uncomplicated.  Her final pathologic diagnosis is a Stage  1A Grade  2 endometrioid endometrial cancer with  negative lymphovascular space invasion,  2/20 (10%) of myometrial invasion and negative lymph nodes.  On 1/14 visit she reported  post coital vaginal bleeding  that was self limiting. Vaginal biopsy - LARGELY DENUDED SQUAMOUS EPITHELIUM WITH ASSOCIATED SPONGIOSIS AND CHRONIC INFLAMMATION.- NO DYSPLASIA OR MALIGNANCY  On the visit 04/2012 she c/o vaginal bleeding.  Biopsies were c/w metastatic disease at the distal vagina  PET 06/05/2012 IMPRESSION:  1. There are no specific features identified to suggest  hypermetabolic metastasis from endometrial carcinoma.  2. Skeletal changes of multiple myeloma with focal area of increased uptake   Treated with vaginal brachytherapy June 4, June 11, June 19, June 25, June 30  Site/dose: Vagina, 30.5 Gy in 5 fractions (6 Gy, 6 Gy, 6 Gy, 6 Gy, 6.5 Gy)  As Doughman has done very well since. She denies any nausea vomiting she reports post coital spotting but no intermittent vaginal bleeding.  Past Medical History  Diagnosis  Date  . Lambda light chain myeloma 11/11/2007  . Hypertension 11/28/97  . H/O multiple myeloma 11/28/1997  . Staphylococcus aureus bacteremia 11/28/1997  . Staphylococcus epidermidis bacteremia 11/28/97  . Pregnancy induced hypertension   . Osteoporosis 12/18/05    Increased  risk   . Increased BMI 12/18/05  . Post-menopausal bleeding 12/19/06  . Vaginal atrophy 12/19/06  . Elevated hemoglobin A1c     Borderline  . Fibroid 07/26/10    asymptomatic  . Vaginal atrophy   . PONV (postoperative nausea and vomiting)     nausea in past, none recent  . Endometrial carcinoma 05/28/12  . History of radiation therapy 6/4, 6/11, 6/19, 6/25, 07/29/2012    vagina 30.5 gray in 5 fx, HDR brachytherapy   Colonoscopy scheduled for 02/29/2012 Mammogram 2013 wnl per patient, mammogram for 2014 has been scheduled  Past Surgical History  Procedure Laterality Date  . Tubal ligation  1986  . Ectopic pregnancy surgery  1992  . Hysteroscopy w/d&c  09/27/2011    Procedure: DILATATION AND CURETTAGE /HYSTEROSCOPY;  Surgeon: Eldred Manges, MD;  Location: [facility];  Service: Gynecology;;  . Laparotomy  10/31/2011    Procedure: EXPLORATORY LAPAROTOMY;  Surgeon: [name], MD PHD;  Location: [facility];  Service: Gynecology;  Laterality: N/A;  . Abdominal hysterectomy  10/31/2011    Procedure: HYSTERECTOMY ABDOMINAL;  Surgeon: [name], MD PHD;  Location: [facility];  Service: Gynecology;  Laterality: N/A;  . 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  Her husband is well  Review of systems: Constitutional:  She has no fever or chills. No changes in weight.  Cardiovascular: No chest pain, palpitations or edema. Respiratory:  No shortness of breath, wheezing or cough Gastrointestinal: She has  normal bowel movements without diarrhea or constipation. She denies any nausea or vomiting. Reports weight gain with current steroid use. Genitourinary:  She denies pelvic pain, pelvic pressure or changes in her urinary function. Denies vaginal bleeding . Reports post coital spotting Musculoskeletal:  No changes in gait or joint pain Otherwise uninformative 10 point review of system   Physical Exam: Blood pressure 145/76, pulse 92, temperature 98.8 F (37.1 C), temperature source Oral, resp. rate 16, height 5' 3.23" (1.606 m), weight 197 lb (89.359 kg). General: Well dressed, well nourished in no apparent distress.   Lungs:  Clear to auscultation bilaterally.  No wheezes. Cardiovascular:  Regular rate and rhythm.  No murmurs or rubs. Abdomen:  Soft, nontender, nondistended.  No palpable masses.  No hepatosplenomegaly.  No ascites. Normal bowel sounds.  No hernias.  Incision is clean dry and intact without any evidence of masses or hernia   Genitourinary: Normal EGBUS  Vaginal cuff intact. No masses in the vagina. No cul de sac fullness or nodularity. Back: No CVA tenderness  LN:  No cervical supraclavicular or inguinal adenopathy  Extremities: No cyanosis, clubbing or edema.  No calf tenderness or erythema Musculoskeletal: No pain, normal strength and range of motion.  [name]  MD., PhD

## 2013-10-29 ENCOUNTER — Ambulatory Visit: Payer: BC Managed Care – PPO | Attending: Gynecologic Oncology | Admitting: Gynecologic Oncology

## 2013-10-29 ENCOUNTER — Encounter: Payer: Self-pay | Admitting: Gynecologic Oncology

## 2013-10-29 VITALS — BP 129/68 | HR 84 | Temp 98.8°F | Resp 18 | Ht 63.23 in | Wt 207.8 lb

## 2013-10-29 DIAGNOSIS — I10 Essential (primary) hypertension: Secondary | ICD-10-CM | POA: Diagnosis not present

## 2013-10-29 DIAGNOSIS — Z90710 Acquired absence of both cervix and uterus: Secondary | ICD-10-CM | POA: Diagnosis not present

## 2013-10-29 DIAGNOSIS — C549 Malignant neoplasm of corpus uteri, unspecified: Secondary | ICD-10-CM | POA: Diagnosis present

## 2013-10-29 DIAGNOSIS — Z923 Personal history of irradiation: Secondary | ICD-10-CM | POA: Insufficient documentation

## 2013-10-29 DIAGNOSIS — Z9079 Acquired absence of other genital organ(s): Secondary | ICD-10-CM | POA: Insufficient documentation

## 2013-10-29 DIAGNOSIS — C541 Malignant neoplasm of endometrium: Secondary | ICD-10-CM

## 2013-10-29 NOTE — Progress Notes (Signed)
Office Visit:  GYN ONCOLOGY   CC:   Endometrial cancer surveillance   Assessment:   63 y.o.  year old with Stage1A Grade 2 endometrioid endometrial cancer staged 10/2011 with recurrence at the distal vagina identified in April 2014.  Subsequent PET scan was negative for evidence of metastatic disease and Emily Greer  completed vaginal  brachytherapy 07/29/2012  Patient's been advised to follow-up with Dr. Sondra Come January 2016 Followup with Dr. Kendall Flack April 2016 Followup with GYN oncology July 2016     HPI:  Emily Greer is a 63 y.o. year old G3P2 initially seen in consultation on 10/05/2011  grade 1  endometrial cancer  She then underwent a  total abdominal hysterectomy bilateral salpingo-oophorectomy bilateral pelvic lymph node dissection on  63/84/5364 without complications.  Her postoperative course was  uncomplicated.  Her final pathologic diagnosis is a Stage  1A Grade  2 endometrioid endometrial cancer with  negative lymphovascular space invasion,  2/20 (10%) of myometrial invasion and negative lymph nodes.  On 1/14 visit she reported  post coital vaginal bleeding  that was self limiting. Vaginal biopsy - LARGELY DENUDED SQUAMOUS EPITHELIUM WITH ASSOCIATED SPONGIOSIS AND CHRONIC INFLAMMATION.- NO DYSPLASIA OR MALIGNANCY  On the visit 04/2012 she c/o vaginal bleeding.  Biopsies were c/w metastatic disease at the distal vagina  PET 06/05/2012 IMPRESSION:  1. There are no specific features identified to suggest  hypermetabolic metastasis from endometrial carcinoma.  2. Skeletal changes of multiple myeloma with focal area of increased uptake   Treated with vaginal brachytherapy June 4, June 11, June 19, June 25, June 30/2014 Site/dose: Vagina, 30.5 Gy in 5 fractions (6 Gy, 6 Gy, 6 Gy, 6 Gy, 6.5 Gy)  Pap 08/17/2013 ASCUS HPV neg  Past Medical History  Diagnosis Date  . Lambda light chain myeloma 11/11/2007  . Hypertension 11/28/97  . H/O multiple myeloma 11/28/1997   . Staphylococcus aureus bacteremia 11/28/1997  . Staphylococcus epidermidis bacteremia 11/28/97  . Pregnancy induced hypertension   . Osteoporosis 12/18/05    Increased  risk   . Increased BMI 12/18/05  . Post-menopausal bleeding 12/19/06  . Vaginal atrophy 12/19/06  . Elevated hemoglobin A1c     Borderline  . Fibroid 07/26/10    asymptomatic  . Vaginal atrophy   . PONV (postoperative nausea and vomiting)     nausea in past, none recent  . Endometrial carcinoma 05/28/12  . History of radiation therapy 6/4, 6/11, 6/19, 6/25, 07/29/2012    vagina 30.5 gray in 5 fx, HDR brachytherapy   Colonoscopy scheduled for 02/29/2012   Past Surgical History  Procedure Laterality Date  . Tubal ligation  1986  . Ectopic pregnancy surgery  1992  . Hysteroscopy w/d&c  09/27/2011    Procedure: DILATATION AND CURETTAGE /HYSTEROSCOPY;  Surgeon: Eldred Manges, MD;  Location: [facility];  Service: Gynecology;;  . Laparotomy  10/31/2011    Procedure: EXPLORATORY LAPAROTOMY;  Surgeon: [name], MD PHD;  Location: [facility];  Service: Gynecology;  Laterality: N/A;  . Abdominal hysterectomy  10/31/2011    Procedure: HYSTERECTOMY ABDOMINAL;  Surgeon: [name], MD PHD;  Location: [facility];  Service: Gynecology;  Laterality: N/A;  . Salpingoophorectomy  10/31/2011    Procedure: SALPINGO OOPHERECTOMY;  Surgeon: [name], MD PHD;  Location: [facility];  Service: Gynecology;  Laterality: Bilateral;   Lymph Nodes sampling   Social History: Retired from Public librarian.  Her husband is well.  She will be a grandmother (boy) in Feb 2016  Review of systems: Constitutional:  She has no fever or chills. No changes in weight.  Cardiovascular: No chest pain, palpitations or edema. Respiratory:  No shortness of breath, wheezing or cough Gastrointestinal: She has normal bowel movements without diarrhea or constipation. She denies any nausea or vomiting.   Genitourinary:  She denies pelvic pain, pelvic pressure  or changes in her urinary function. Denies vaginal bleeding . Reports post coital spotting Musculoskeletal:  No changes in gait or joint pain Otherwise uninformative 10 point review of system   Physical Exam: Blood pressure 129/68, pulse 84, temperature 98.8 F (37.1 C), temperature source Oral, resp. rate 18, height 5' 3.23" (1.606 m), weight 207 lb 12.8 oz (94.257 kg). Wt Readings from Last 3 Encounters:  10/29/13 207 lb 12.8 oz (94.257 kg)  10/07/13 212 lb (96.163 kg)  09/04/13 215 lb (97.523 kg)    General: Well dressed, well nourished in no apparent distress.   Lungs:  Clear to auscultation bilaterally.  No wheezes. Cardiovascular:  Regular rate and rhythm.  No murmurs or rubs. Abdomen:  Soft, nontender, nondistended.  No palpable masses.  No hepatosplenomegaly.  No ascites. Normal bowel sounds.  No hernias.  Incision is clean dry and intact without any evidence of masses or hernia   Genitourinary: Normal EGBUS  Vaginal cuff intact. No masses in the vagina. No cul de sac fullness or nodularity. Back: No CVA tenderness  LN:  No cervical supraclavicular or inguinal adenopathy  Extremities: No cyanosis, clubbing or edema.  No calf tenderness or erythema Musculoskeletal: No pain, normal strength and range of motion.  [name]  MD., PhD

## 2013-10-29 NOTE — Patient Instructions (Signed)
Follow-up with Dr. Sondra Come January 2016 Followup with Dr. Kendall Flack April 2016 Followup with GYN oncology July 2016  Body mass index is 36.54 kg/(m^2).    FAST FACTS   Body Mass Index (BMI) is one measurement that your doctor may use to discuss your weight   BMI is an estimate of body fat. Individuals with a BMI of 25.0-29.9 are considered overweight. Those with a BMI above 30.0 are considered obese   Obesity is a risk factor for many cancers, especially endometrial cancer. In fact, if you are obese, your risk for endometrial cancer may be 10 times higher.   Obesity may affect how your cancer is treated (surgery, chemotherapy, and/or radiation).   If you are overweight or obese, ask your doctor for information about diet and exercise programs.  EXERCISE Here is a list of resources for exercise recommendations and programs that can help you get started. Be sure to look for exercise programs and classes in your neighborhood to get personal support.  American Cancer Society (ACS): Eat Healthy and Get Active www.cancer.org/healthy/eathealthygetactive/ The site provides details about the importance of exercise in cancer prevention as well as resources providing exercise guidelines and tools to set goals and manage physical activity  American Council on Exercise (ACE): Get Fit www.acefitness.org/acefit  This site is full of fitness programs including personalized training workouts and a Art therapist of exercise programs. Links to local exercise trainers are provided.  American Heart Association: Getting Healthy - Physical Activity https://richard.com/ This site provides the American Heart Association guidelines for physical activity, tips for getting started and tips for long term success.    Centers for Disease Control and Prevention: Physical  Activity [place]DryCleaner.si This site offers physical activity guidelines for all Americans including interval and type of exercise. Measures for physical activity intensity are provided, as well as success stories to inspire you.  National Association for [employer].physicalfitness.org/ This is a Environmental education officer that exists to improve the quality of life for indivduals through promotion of physical fitness, sports, and healthy lifestyles. This site has links to information on fitness, physical activity, and general well-being.  World Health Organization: Health Topics - Physical Activity CardChaser.es This site provides clear definitions of physical activity and global recommendations on physical activity for health.    DIET The following websites are designed to help you lose weight and eat healthy. Additional resources may be available through your cancer center, clinic, or hospital.  Caring 4 Cancer www.caring4cancer.com/ This site provides education materials and videos regarding diet and nutrition in cancer.  Livestrong https://www.carr.net/ This site provides both education and food journaling for cancer survivors. There are also videos and blogs including recipes and motivational materials.   Choose My Plate www.ArtistMovie.se The site "BuildDNA.es" is a great site for tracking diet and obtaining healthy recipes. Patients can also obtain daily food plans and worksheets. "Supertracker" can help you plan and track your meals.  [employer] for Cancer Research MobLag.com.cy The American Institute for [employer] has a program called the "New American Plate Challenge". This program is 12 weeks long and provides instruction regarding healthy eating that specifically includes Food that Fight CancerTM.  You can track your progress and exchange messages with other members.  This  information is brought to you by the Society of Gynecologic Oncology. The Society of Gynecologic Oncology is an 1,800-member organization of medical specialists dedicated to the eradication of gynecologic cancers. Our members include primarily gynecologic oncologists, as well as medical oncologists, pathologists, radiation oncologists,  hematologists, surgical oncologsits, obstetrician/gynecologists, nurses, physician assistants, and other allied health professionals interested in the treatment and care of women's cancer.   For more information go to:  [email or web address]/OBESITY

## 2013-11-03 ENCOUNTER — Other Ambulatory Visit: Payer: BC Managed Care – PPO

## 2013-11-04 ENCOUNTER — Other Ambulatory Visit: Payer: Self-pay | Admitting: Nurse Practitioner

## 2013-11-04 ENCOUNTER — Telehealth: Payer: Self-pay | Admitting: Hematology & Oncology

## 2013-11-04 LAB — CHROMOSOME ANALYSIS, BONE MARROW

## 2013-11-04 LAB — TISSUE HYBRIDIZATION (BONE MARROW)-NCBH

## 2013-11-04 MED ORDER — POMALIDOMIDE 4 MG PO CAPS: 4.0000 mg | ORAL_CAPSULE | Freq: Every day | ORAL | Status: DC

## 2013-11-04 NOTE — Telephone Encounter (Signed)
Endometrial cancer - Primary  ICD-9-CM: 182.0 ICD-10-CM: C54.1  J9041 - bortezomib SQ (VELCADE) chemo injection 2.75 mg J2405 - ondansetron (ZOFRAN) tablet 8 mg     Efc: 08/30/2013 - current  I spoke w Dyann Ruddle on today 11/04/2013  Ref: [id number]

## 2013-11-05 ENCOUNTER — Encounter: Payer: Self-pay | Admitting: Hematology & Oncology

## 2013-11-05 ENCOUNTER — Other Ambulatory Visit (HOSPITAL_BASED_OUTPATIENT_CLINIC_OR_DEPARTMENT_OTHER): Payer: BC Managed Care – PPO | Admitting: Lab

## 2013-11-05 ENCOUNTER — Ambulatory Visit (HOSPITAL_BASED_OUTPATIENT_CLINIC_OR_DEPARTMENT_OTHER): Payer: BC Managed Care – PPO

## 2013-11-05 VITALS — BP 115/52 | HR 91 | Temp 97.6°F | Resp 16

## 2013-11-05 DIAGNOSIS — Z5112 Encounter for antineoplastic immunotherapy: Secondary | ICD-10-CM

## 2013-11-05 DIAGNOSIS — C9000 Multiple myeloma not having achieved remission: Secondary | ICD-10-CM

## 2013-11-05 DIAGNOSIS — Z8579 Personal history of other malignant neoplasms of lymphoid, hematopoietic and related tissues: Secondary | ICD-10-CM

## 2013-11-05 DIAGNOSIS — Z23 Encounter for immunization: Secondary | ICD-10-CM

## 2013-11-05 LAB — BASIC METABOLIC PANEL - CANCER CENTER ONLY
BUN, Bld: 12 mg/dL (ref 7–22)
CO2: 24 mEq/L (ref 18–33)
Calcium: 9.3 mg/dL (ref 8.0–10.3)
Chloride: 101 mEq/L (ref 98–108)
Creat: 1 mg/dl (ref 0.6–1.2)
Glucose, Bld: 98 mg/dL (ref 73–118)
Potassium: 3.3 mEq/L (ref 3.3–4.7)
Sodium: 142 mEq/L (ref 128–145)

## 2013-11-05 LAB — CBC WITH DIFFERENTIAL (CANCER CENTER ONLY)
BASO#: 0.2 10*3/uL (ref 0.0–0.2)
BASO%: 2.5 % — ABNORMAL HIGH (ref 0.0–2.0)
EOS%: 3.4 % (ref 0.0–7.0)
Eosinophils Absolute: 0.2 10*3/uL (ref 0.0–0.5)
HCT: 32.4 % — ABNORMAL LOW (ref 34.8–46.6)
HGB: 10.8 g/dL — ABNORMAL LOW (ref 11.6–15.9)
LYMPH#: 1.2 10*3/uL (ref 0.9–3.3)
LYMPH%: 17.4 % (ref 14.0–48.0)
MCH: 26.6 pg (ref 26.0–34.0)
MCHC: 33.3 g/dL (ref 32.0–36.0)
MCV: 80 fL — ABNORMAL LOW (ref 81–101)
MONO#: 0.7 10*3/uL (ref 0.1–0.9)
MONO%: 10.7 % (ref 0.0–13.0)
NEUT#: 4.5 10*3/uL (ref 1.5–6.5)
NEUT%: 66 % (ref 39.6–80.0)
Platelets: 294 10*3/uL (ref 145–400)
RBC: 4.06 10*6/uL (ref 3.70–5.32)
RDW: 17.7 % — ABNORMAL HIGH (ref 11.1–15.7)
WBC: 6.8 10*3/uL (ref 3.9–10.0)

## 2013-11-05 LAB — TECHNOLOGIST REVIEW CHCC SATELLITE

## 2013-11-05 MED ORDER — DEXAMETHASONE 4 MG PO TABS: 8.0000 mg | ORAL_TABLET | Freq: Two times a day (BID) | ORAL | Status: DC

## 2013-11-05 MED ORDER — SODIUM CHLORIDE 0.9 % IV SOLN
Freq: Once | INTRAVENOUS | Status: AC
  Administered 2013-11-05: 10:00:00 via INTRAVENOUS

## 2013-11-05 MED ORDER — ONDANSETRON HCL 8 MG PO TABS
8.0000 mg | ORAL_TABLET | Freq: Once | ORAL | Status: AC
  Administered 2013-11-05: 8 mg via ORAL

## 2013-11-05 MED ORDER — ONDANSETRON HCL 8 MG PO TABS
ORAL_TABLET | ORAL | Status: AC
  Filled 2013-11-05: qty 1

## 2013-11-05 MED ORDER — LORAZEPAM 0.5 MG PO TABS: 0.5000 mg | ORAL_TABLET | Freq: Four times a day (QID) | ORAL | Status: DC | PRN

## 2013-11-05 MED ORDER — BORTEZOMIB CHEMO SQ INJECTION 3.5 MG (2.5MG/ML)
1.3000 mg/m2 | Freq: Once | INTRAMUSCULAR | Status: AC
  Administered 2013-11-05: 2.75 mg via SUBCUTANEOUS
  Filled 2013-11-05: qty 2.75

## 2013-11-05 MED ORDER — ONDANSETRON HCL 8 MG PO TABS: 8.0000 mg | ORAL_TABLET | Freq: Two times a day (BID) | ORAL | Status: DC

## 2013-11-05 MED ORDER — PNEUMOCOCCAL VAC POLYVALENT 25 MCG/0.5ML IJ INJ
0.5000 mL | INJECTION | Freq: Once | INTRAMUSCULAR | Status: AC
  Administered 2013-11-05: 0.5 mL via INTRAMUSCULAR
  Filled 2013-11-05: qty 0.5

## 2013-11-05 MED ORDER — ACYCLOVIR 400 MG PO TABS: 400.0000 mg | ORAL_TABLET | Freq: Two times a day (BID) | ORAL | Status: DC

## 2013-11-05 MED ORDER — PAMIDRONATE DISODIUM 90 MG/10ML IV SOLN
90.0000 mg | Freq: Once | INTRAVENOUS | Status: AC
  Administered 2013-11-05: 90 mg via INTRAVENOUS
  Filled 2013-11-05: qty 10

## 2013-11-05 MED ORDER — PROCHLORPERAZINE MALEATE 10 MG PO TABS: 10.0000 mg | ORAL_TABLET | Freq: Four times a day (QID) | ORAL | Status: DC | PRN

## 2013-11-05 NOTE — Patient Instructions (Signed)
Pamidronate injection What is this medicine? PAMIDRONATE (pa mi DROE nate) slows calcium loss from bones. It is used to treat high calcium blood levels from cancer or Paget's disease. It is also used to treat bone pain and prevent fractures from certain cancers that have spread to the bone. This medicine may be used for other purposes; ask your health care provider or pharmacist if you have questions. COMMON BRAND NAME(S): Aredia What should I tell my health care provider before I take this medicine? They need to know if you have any of these conditions: -aspirin-sensitive asthma -dental disease -kidney disease -an unusual or allergic reaction to pamidronate, other medicines, foods, dyes, or preservatives -pregnant or trying to get pregnant -breast-feeding How should I use this medicine? This medicine is for infusion into a vein. It is given by a health care professional in a hospital or clinic setting. Talk to your pediatrician regarding the use of this medicine in children. This medicine is not approved for use in children. Overdosage: If you think you have taken too much of this medicine contact a poison control center or emergency room at once. NOTE: This medicine is only for you. Do not share this medicine with others. What if I miss a dose? This does not apply. What may interact with this medicine? -certain antibiotics given by injection -medicines for inflammation or pain like ibuprofen, naproxen -some diuretics like bumetanide, furosemide -cyclosporine -parathyroid hormone -tacrolimus -teriparatide -thalidomide This list may not describe all possible interactions. Give your health care provider a list of all the medicines, herbs, non-prescription drugs, or dietary supplements you use. Also tell them if you smoke, drink alcohol, or use illegal drugs. Some items may interact with your medicine. What should I watch for while using this medicine? Visit your doctor or health care  professional for regular checkups. It may be some time before you see the benefit from this medicine. Do not stop taking your medicine unless your doctor tells you to. Your doctor may order blood tests or other tests to see how you are doing. Women should inform their doctor if they wish to become pregnant or think they might be pregnant. There is a potential for serious side effects to an unborn child. Talk to your health care professional or pharmacist for more information. You should make sure that you get enough calcium and vitamin D while you are taking this medicine. Discuss the foods you eat and the vitamins you take with your health care professional. Some people who take this medicine have severe bone, joint, and/or muscle pain. This medicine may also increase your risk for a broken thigh bone. Tell your doctor right away if you have pain in your upper leg or groin. Tell your doctor if you have any pain that does not go away or that gets worse. What side effects may I notice from receiving this medicine? Side effects that you should report to your doctor or health care professional as soon as possible: -allergic reactions like skin rash, itching or hives, swelling of the face, lips, or tongue -black or tarry stools -changes in vision -eye inflammation, pain -high blood pressure -jaw pain, especially burning or cramping -muscle weakness -numb, tingling pain -swelling of feet or hands -trouble passing urine or change in the amount of urine -unable to move easily Side effects that usually do not require medical attention (report to your doctor or health care professional if they continue or are bothersome): -bone, joint, or muscle pain -constipation -dizzy, drowsy -  fever -headache -loss of appetite -nausea, vomiting -pain at site where injected This list may not describe all possible side effects. Call your doctor for medical advice about side effects. You may report side effects to  FDA at 1-800-FDA-1088. Where should I keep my medicine? This drug is given in a hospital or clinic and will not be stored at home. NOTE: This sheet is a summary. It may not cover all possible information. If you have questions about this medicine, talk to your doctor, pharmacist, or health care provider.  2015, Elsevier/Gold Standard. (2010-07-15 08:49:49)  Bortezomib injection What is this medicine? BORTEZOMIB (bor TEZ oh mib) is a chemotherapy drug. It slows the growth of cancer cells. This medicine is used to treat multiple myeloma, and certain lymphomas, such as mantle-cell lymphoma. This medicine may be used for other purposes; ask your health care provider or pharmacist if you have questions. COMMON BRAND NAME(S): Velcade What should I tell my health care provider before I take this medicine? They need to know if you have any of these conditions: -diabetes -heart disease -irregular heartbeat -liver disease -on hemodialysis -low blood counts, like low white blood cells, platelets, or hemoglobin -peripheral neuropathy -taking medicine for blood pressure -an unusual or allergic reaction to bortezomib, mannitol, boron, other medicines, foods, dyes, or preservatives -pregnant or trying to get pregnant -breast-feeding How should I use this medicine? This medicine is for injection into a vein or for injection under the skin. It is given by a health care professional in a hospital or clinic setting. Talk to your pediatrician regarding the use of this medicine in children. Special care may be needed. Overdosage: If you think you have taken too much of this medicine contact a poison control center or emergency room at once. NOTE: This medicine is only for you. Do not share this medicine with others. What if I miss a dose? It is important not to miss your dose. Call your doctor or health care professional if you are unable to keep an appointment. What may interact with this medicine? This  medicine may interact with the following medications: -ketoconazole -rifampin -ritonavir -St. John's Wort This list may not describe all possible interactions. Give your health care provider a list of all the medicines, herbs, non-prescription drugs, or dietary supplements you use. Also tell them if you smoke, drink alcohol, or use illegal drugs. Some items may interact with your medicine. What should I watch for while using this medicine? Visit your doctor for checks on your progress. This drug may make you feel generally unwell. This is not uncommon, as chemotherapy can affect healthy cells as well as cancer cells. Report any side effects. Continue your course of treatment even though you feel ill unless your doctor tells you to stop. You may get drowsy or dizzy. Do not drive, use machinery, or do anything that needs mental alertness until you know how this medicine affects you. Do not stand or sit up quickly, especially if you are an older patient. This reduces the risk of dizzy or fainting spells. In some cases, you may be given additional medicines to help with side effects. Follow all directions for their use. Call your doctor or health care professional for advice if you get a fever, chills or sore throat, or other symptoms of a cold or flu. Do not treat yourself. This drug decreases your body's ability to fight infections. Try to avoid being around people who are sick. This medicine may increase your risk to bruise  or bleed. Call your doctor or health care professional if you notice any unusual bleeding. You may need blood work done while you are taking this medicine. In some patients, this medicine may cause a serious brain infection that may cause death. If you have any problems seeing, thinking, speaking, walking, or standing, tell your doctor right away. If you cannot reach your doctor, urgently seek other source of medical care. Do not become pregnant while taking this medicine. Women  should inform their doctor if they wish to become pregnant or think they might be pregnant. There is a potential for serious side effects to an unborn child. Talk to your health care professional or pharmacist for more information. Do not breast-feed an infant while taking this medicine. Check with your doctor or health care professional if you get an attack of severe diarrhea, nausea and vomiting, or if you sweat a lot. The loss of too much body fluid can make it dangerous for you to take this medicine. What side effects may I notice from receiving this medicine? Side effects that you should report to your doctor or health care professional as soon as possible: -allergic reactions like skin rash, itching or hives, swelling of the face, lips, or tongue -breathing problems -changes in hearing -changes in vision -fast, irregular heartbeat -feeling faint or lightheaded, falls -pain, tingling, numbness in the hands or feet -right upper belly pain -seizures -swelling of the ankles, feet, hands -unusual bleeding or bruising -unusually weak or tired -vomiting -yellowing of the eyes or skin Side effects that usually do not require medical attention (report to your doctor or health care professional if they continue or are bothersome): -changes in emotions or moods -constipation -diarrhea -loss of appetite -headache -irritation at site where injected -nausea This list may not describe all possible side effects. Call your doctor for medical advice about side effects. You may report side effects to FDA at 1-800-FDA-1088. Where should I keep my medicine? This drug is given in a hospital or clinic and will not be stored at home. NOTE: This sheet is a summary. It may not cover all possible information. If you have questions about this medicine, talk to your doctor, pharmacist, or health care provider.  2015, Elsevier/Gold Standard. (2012-11-11 12:46:32)

## 2013-11-07 ENCOUNTER — Other Ambulatory Visit: Payer: Self-pay | Admitting: Nurse Practitioner

## 2013-11-07 DIAGNOSIS — C9000 Multiple myeloma not having achieved remission: Secondary | ICD-10-CM

## 2013-11-07 DIAGNOSIS — Z8579 Personal history of other malignant neoplasms of lymphoid, hematopoietic and related tissues: Secondary | ICD-10-CM

## 2013-11-07 MED ORDER — ACYCLOVIR 400 MG PO TABS: 400.0000 mg | ORAL_TABLET | Freq: Two times a day (BID) | ORAL | Status: DC

## 2013-11-07 MED ORDER — ONDANSETRON HCL 8 MG PO TABS
8.0000 mg | ORAL_TABLET | Freq: Two times a day (BID) | ORAL | Status: DC
Start: 2013-11-07 — End: 2013-11-12

## 2013-11-07 MED ORDER — PROCHLORPERAZINE MALEATE 10 MG PO TABS: 10.0000 mg | ORAL_TABLET | Freq: Four times a day (QID) | ORAL | Status: DC | PRN

## 2013-11-10 ENCOUNTER — Other Ambulatory Visit: Payer: Self-pay | Admitting: Gynecologic Oncology

## 2013-11-10 DIAGNOSIS — R300 Dysuria: Secondary | ICD-10-CM

## 2013-11-10 NOTE — Progress Notes (Signed)
Patient called with complaints of dysuria.  She recently completed 14 days of cipro starting 09/29/13.  She is to come in tomorrow to give a urine sample for analysis and culture.  Verbalizing understanding.

## 2013-11-11 ENCOUNTER — Other Ambulatory Visit: Payer: BC Managed Care – PPO

## 2013-11-11 DIAGNOSIS — R300 Dysuria: Secondary | ICD-10-CM

## 2013-11-11 LAB — URINALYSIS, MICROSCOPIC - CHCC
Bilirubin (Urine): NEGATIVE
Glucose: NEGATIVE mg/dL
Ketones: NEGATIVE mg/dL
Leukocyte Esterase: NEGATIVE
Nitrite: NEGATIVE
Protein: <30 mg/dL
Specific Gravity, Urine: 1.03 (ref 1.003–1.035)
Urobilinogen, UR: 0.2 mg/dL (ref 0.2–1)
pH: 6 (ref 4.6–8.0)

## 2013-11-12 ENCOUNTER — Other Ambulatory Visit (HOSPITAL_BASED_OUTPATIENT_CLINIC_OR_DEPARTMENT_OTHER): Payer: BC Managed Care – PPO | Admitting: Lab

## 2013-11-12 ENCOUNTER — Ambulatory Visit (HOSPITAL_BASED_OUTPATIENT_CLINIC_OR_DEPARTMENT_OTHER): Payer: BC Managed Care – PPO

## 2013-11-12 ENCOUNTER — Ambulatory Visit (HOSPITAL_BASED_OUTPATIENT_CLINIC_OR_DEPARTMENT_OTHER): Payer: BC Managed Care – PPO | Admitting: Hematology & Oncology

## 2013-11-12 ENCOUNTER — Encounter: Payer: Self-pay | Admitting: Hematology & Oncology

## 2013-11-12 VITALS — BP 131/66 | HR 82 | Temp 97.4°F | Resp 14 | Ht 63.0 in | Wt 211.0 lb

## 2013-11-12 DIAGNOSIS — C9000 Multiple myeloma not having achieved remission: Secondary | ICD-10-CM

## 2013-11-12 DIAGNOSIS — C9002 Multiple myeloma in relapse: Secondary | ICD-10-CM

## 2013-11-12 DIAGNOSIS — Z5112 Encounter for antineoplastic immunotherapy: Secondary | ICD-10-CM

## 2013-11-12 LAB — BASIC METABOLIC PANEL
BUN: 22 mg/dL (ref 6–23)
CO2: 27 mEq/L (ref 19–32)
Calcium: 10 mg/dL (ref 8.4–10.5)
Chloride: 104 mEq/L (ref 96–112)
Creatinine, Ser: 0.99 mg/dL (ref 0.50–1.10)
Glucose, Bld: 98 mg/dL (ref 70–99)
Potassium: 3.7 mEq/L (ref 3.5–5.3)
Sodium: 142 mEq/L (ref 135–145)

## 2013-11-12 LAB — CBC WITH DIFFERENTIAL (CANCER CENTER ONLY)
BASO#: 0.1 10*3/uL (ref 0.0–0.2)
BASO%: 1.4 % (ref 0.0–2.0)
EOS%: 5.9 % (ref 0.0–7.0)
Eosinophils Absolute: 0.3 10*3/uL (ref 0.0–0.5)
HCT: 29.6 % — ABNORMAL LOW (ref 34.8–46.6)
HGB: 9.5 g/dL — ABNORMAL LOW (ref 11.6–15.9)
LYMPH#: 0.8 10*3/uL — ABNORMAL LOW (ref 0.9–3.3)
LYMPH%: 18.7 % (ref 14.0–48.0)
MCH: 25.9 pg — ABNORMAL LOW (ref 26.0–34.0)
MCHC: 32.1 g/dL (ref 32.0–36.0)
MCV: 81 fL (ref 81–101)
MONO#: 0.9 10*3/uL (ref 0.1–0.9)
MONO%: 20.3 % — ABNORMAL HIGH (ref 0.0–13.0)
NEUT#: 2.4 10*3/uL (ref 1.5–6.5)
NEUT%: 53.7 % (ref 39.6–80.0)
Platelets: 261 10*3/uL (ref 145–400)
RBC: 3.67 10*6/uL — ABNORMAL LOW (ref 3.70–5.32)
RDW: 17.7 % — ABNORMAL HIGH (ref 11.1–15.7)
WBC: 4.4 10*3/uL (ref 3.9–10.0)

## 2013-11-12 LAB — URINE CULTURE

## 2013-11-12 MED ORDER — ONDANSETRON HCL 8 MG PO TABS
8.0000 mg | ORAL_TABLET | Freq: Once | ORAL | Status: AC
  Administered 2013-11-12: 8 mg via ORAL

## 2013-11-12 MED ORDER — BORTEZOMIB CHEMO SQ INJECTION 3.5 MG (2.5MG/ML)
1.3000 mg/m2 | Freq: Once | INTRAMUSCULAR | Status: AC
  Administered 2013-11-12: 2.75 mg via SUBCUTANEOUS
  Filled 2013-11-12: qty 2.75

## 2013-11-12 MED ORDER — ONDANSETRON HCL 8 MG PO TABS
ORAL_TABLET | ORAL | Status: AC
  Filled 2013-11-12: qty 1

## 2013-11-12 NOTE — Patient Instructions (Signed)
Bortezomib injection What is this medicine? BORTEZOMIB (bor TEZ oh mib) is a chemotherapy drug. It slows the growth of cancer cells. This medicine is used to treat multiple myeloma, and certain lymphomas, such as mantle-cell lymphoma. This medicine may be used for other purposes; ask your health care provider or pharmacist if you have questions. COMMON BRAND NAME(S): Velcade What should I tell my health care provider before I take this medicine? They need to know if you have any of these conditions: -diabetes -heart disease -irregular heartbeat -liver disease -on hemodialysis -low blood counts, like low white blood cells, platelets, or hemoglobin -peripheral neuropathy -taking medicine for blood pressure -an unusual or allergic reaction to bortezomib, mannitol, boron, other medicines, foods, dyes, or preservatives -pregnant or trying to get pregnant -breast-feeding How should I use this medicine? This medicine is for injection into a vein or for injection under the skin. It is given by a health care professional in a hospital or clinic setting. Talk to your pediatrician regarding the use of this medicine in children. Special care may be needed. Overdosage: If you think you have taken too much of this medicine contact a poison control center or emergency room at once. NOTE: This medicine is only for you. Do not share this medicine with others. What if I miss a dose? It is important not to miss your dose. Call your doctor or health care professional if you are unable to keep an appointment. What may interact with this medicine? This medicine may interact with the following medications: -ketoconazole -rifampin -ritonavir -St. John's Wort This list may not describe all possible interactions. Give your health care provider a list of all the medicines, herbs, non-prescription drugs, or dietary supplements you use. Also tell them if you smoke, drink alcohol, or use illegal drugs. Some items  may interact with your medicine. What should I watch for while using this medicine? Visit your doctor for checks on your progress. This drug may make you feel generally unwell. This is not uncommon, as chemotherapy can affect healthy cells as well as cancer cells. Report any side effects. Continue your course of treatment even though you feel ill unless your doctor tells you to stop. You may get drowsy or dizzy. Do not drive, use machinery, or do anything that needs mental alertness until you know how this medicine affects you. Do not stand or sit up quickly, especially if you are an older patient. This reduces the risk of dizzy or fainting spells. In some cases, you may be given additional medicines to help with side effects. Follow all directions for their use. Call your doctor or health care professional for advice if you get a fever, chills or sore throat, or other symptoms of a cold or flu. Do not treat yourself. This drug decreases your body's ability to fight infections. Try to avoid being around people who are sick. This medicine may increase your risk to bruise or bleed. Call your doctor or health care professional if you notice any unusual bleeding. You may need blood work done while you are taking this medicine. In some patients, this medicine may cause a serious brain infection that may cause death. If you have any problems seeing, thinking, speaking, walking, or standing, tell your doctor right away. If you cannot reach your doctor, urgently seek other source of medical care. Do not become pregnant while taking this medicine. Women should inform their doctor if they wish to become pregnant or think they might be pregnant. There is   a potential for serious side effects to an unborn child. Talk to your health care professional or pharmacist for more information. Do not breast-feed an infant while taking this medicine. Check with your doctor or health care professional if you get an attack of  severe diarrhea, nausea and vomiting, or if you sweat a lot. The loss of too much body fluid can make it dangerous for you to take this medicine. What side effects may I notice from receiving this medicine? Side effects that you should report to your doctor or health care professional as soon as possible: -allergic reactions like skin rash, itching or hives, swelling of the face, lips, or tongue -breathing problems -changes in hearing -changes in vision -fast, irregular heartbeat -feeling faint or lightheaded, falls -pain, tingling, numbness in the hands or feet -right upper belly pain -seizures -swelling of the ankles, feet, hands -unusual bleeding or bruising -unusually weak or tired -vomiting -yellowing of the eyes or skin Side effects that usually do not require medical attention (report to your doctor or health care professional if they continue or are bothersome): -changes in emotions or moods -constipation -diarrhea -loss of appetite -headache -irritation at site where injected -nausea This list may not describe all possible side effects. Call your doctor for medical advice about side effects. You may report side effects to FDA at 1-800-FDA-1088. Where should I keep my medicine? This drug is given in a hospital or clinic and will not be stored at home. NOTE: This sheet is a summary. It may not cover all possible information. If you have questions about this medicine, talk to your doctor, pharmacist, or health care provider.  2015, Elsevier/Gold Standard. (2012-11-11 12:46:32)  

## 2013-11-12 NOTE — Progress Notes (Signed)
Hematology and Oncology Follow Up Visit  Emily Greer [id number] April 30, 1950 63 y.o. 11/12/2013   Principle Diagnosis:   Recurrent lambda light chain myeloma  History of stage I endometrial carcinoma  Current Therapy:    Pomalidomide/Velcade  Aredia 60 mg IV Q. month     Interim History:  Emily Greer is back for followup. She started the Velcade last week.  She did well with it.  She really wants to try to get off the Decadron. I think that that is okay. She is also on Biaxin. I told her to stop that.  We did go ahead and get a bone marrow biopsy on her. This was done before she went to [place]. The pathology report ([id number]) shows 21% plasma cells in the bone marrow. I still have to try to figure out what the cytogenetics shows.  I think that should that he is going to end up needing a another I taught her husband and she at length today about the whole situation.  She's done well with the Pomalidomide.  I told her that we can check her blood in her urine, both, to see how well she's done.   Her last bone scan was done back in July. His bases showed stable lytic lesions. She was not having any problems with bony pain. She is most worried about her weight. I  Overall, her performance status is ECOG 1 Medications: Allergies:  Allergies  Allergen Reactions  . Codeine Nausea Only    Past Medical History, Surgical history, Social history, and Family History were reviewed and updated.  Review of Systems: As above  Physical Exam:  height is $RemoveB'5\' 3"'RKaxJTgA$  (1.6 m) and weight is 211 lb (95.709 kg). Her oral temperature is 97.4 F (36.3 C). Her blood pressure is 131/66 and her pulse is 82. Her respiration is 14.   Well-developed and well-nourished [name]. Head and neck exam shows no ocular or oral lesion. There are no palpable cervical or supraclavicular lymph nodes. Lungs are clear. Cardiac exam regular rate and rhythm with no murmurs rubs or bruits. Abdomen  is soft. She has good bowel sounds. There is no fluid wave. There is no palpable liver or spleen tip. Extremities shows no clubbing, cyanosis or edema. Neurological exam shows no focal neurological deficits. Skin exam no rashes, ecchymosis or petechia.  Lab Results  Component Value Date   WBC 4.4 11/12/2013   HGB 9.5* 11/12/2013   HCT 29.6* 11/12/2013   MCV 81 11/12/2013   PLT 261 11/12/2013     Chemistry      Component Value Date/Time   NA 142 11/12/2013 0926   NA 142 11/05/2013 0903   NA 141 09/04/2013 1049   K 3.7 11/12/2013 0926   K 3.3 11/05/2013 0903   K 4.0 09/04/2013 1049   CL 104 11/12/2013 0926   CL 101 11/05/2013 0903   CO2 27 11/12/2013 0926   CO2 24 11/05/2013 0903   CO2 27 09/04/2013 1049   BUN 22 11/12/2013 0926   BUN 12 11/05/2013 0903   BUN 18.3 09/04/2013 1049   CREATININE 0.99 11/12/2013 0926   CREATININE 1.0 11/05/2013 0903   CREATININE 0.9 09/04/2013 1049      Component Value Date/Time   CALCIUM 10.0 11/12/2013 0926   CALCIUM 9.3 11/05/2013 0903   CALCIUM 9.8 09/04/2013 1049   ALKPHOS 45 09/04/2013 1049   ALKPHOS 33 07/21/2013 1110   ALKPHOS 37* 04/28/2013 0955   AST 12 09/04/2013 1049  AST 16 07/21/2013 1110   AST 10 04/28/2013 0955   ALT 17 09/04/2013 1049   ALT 22 07/21/2013 1110   ALT 18 04/28/2013 0955   BILITOT 0.47 09/04/2013 1049   BILITOT 0.60 07/21/2013 1110   BILITOT 0.3 04/28/2013 0955         Impression and Plan: Emily Greer is 63 year old female. She has recurrent lambda light chain myeloma.  She now is on Pomalidomide with a Velcade. She never has had a Velcade. I don't think Velcade was even out when she was first diagnosed.  We will continue her on weekly Velcade. She'll do the Pomalidomide 21 days on and 7 days off.  I'll plan to see her back myself in about 3 or 4 weeks. When we see her back, we will do the Aredia.  I spent about 30 minutes with she and her husband today.  [name], MD 10/14/20155:42 PM

## 2013-11-14 ENCOUNTER — Other Ambulatory Visit: Payer: Self-pay | Admitting: Gynecologic Oncology

## 2013-11-14 DIAGNOSIS — N390 Urinary tract infection, site not specified: Secondary | ICD-10-CM

## 2013-11-14 MED ORDER — NITROFURANTOIN MONOHYD MACRO 100 MG PO CAPS: 100.0000 mg | ORAL_CAPSULE | Freq: Two times a day (BID) | ORAL | Status: DC

## 2013-11-14 NOTE — Progress Notes (Signed)
Patient informed of urine culture results.  Going to treat with macrobid per Dr. Denman George since pt recently completed 14 days of cipro.  Patient verbalizing understanding.  Advised to call for any questions or concerns.

## 2013-11-16 ENCOUNTER — Other Ambulatory Visit: Payer: Self-pay | Admitting: Hematology & Oncology

## 2013-11-18 ENCOUNTER — Telehealth: Payer: Self-pay | Admitting: Hematology & Oncology

## 2013-11-18 NOTE — Telephone Encounter (Signed)
Pt called to cancel. Will cb to reschedule.

## 2013-11-19 ENCOUNTER — Ambulatory Visit: Payer: BC Managed Care – PPO

## 2013-11-19 ENCOUNTER — Other Ambulatory Visit: Payer: BC Managed Care – PPO | Admitting: Lab

## 2013-11-19 ENCOUNTER — Encounter (HOSPITAL_COMMUNITY): Payer: Self-pay

## 2013-11-20 ENCOUNTER — Other Ambulatory Visit (HOSPITAL_BASED_OUTPATIENT_CLINIC_OR_DEPARTMENT_OTHER): Payer: BC Managed Care – PPO | Admitting: Lab

## 2013-11-20 ENCOUNTER — Ambulatory Visit (HOSPITAL_BASED_OUTPATIENT_CLINIC_OR_DEPARTMENT_OTHER): Payer: BC Managed Care – PPO

## 2013-11-20 VITALS — BP 131/62 | HR 82 | Temp 98.0°F

## 2013-11-20 DIAGNOSIS — Z5112 Encounter for antineoplastic immunotherapy: Secondary | ICD-10-CM

## 2013-11-20 DIAGNOSIS — C9000 Multiple myeloma not having achieved remission: Secondary | ICD-10-CM

## 2013-11-20 LAB — BASIC METABOLIC PANEL - CANCER CENTER ONLY
BUN, Bld: 13 mg/dL (ref 7–22)
CO2: 24 mEq/L (ref 18–33)
Calcium: 8.6 mg/dL (ref 8.0–10.3)
Chloride: 102 mEq/L (ref 98–108)
Creat: 1.3 mg/dl — ABNORMAL HIGH (ref 0.6–1.2)
Glucose, Bld: 87 mg/dL (ref 73–118)
Potassium: 3.4 mEq/L (ref 3.3–4.7)
Sodium: 138 mEq/L (ref 128–145)

## 2013-11-20 LAB — CBC WITH DIFFERENTIAL (CANCER CENTER ONLY)
BASO#: 0.1 10*3/uL (ref 0.0–0.2)
BASO%: 2.9 % — ABNORMAL HIGH (ref 0.0–2.0)
EOS%: 4.5 % (ref 0.0–7.0)
Eosinophils Absolute: 0.2 10*3/uL (ref 0.0–0.5)
HCT: 29.8 % — ABNORMAL LOW (ref 34.8–46.6)
HGB: 9.8 g/dL — ABNORMAL LOW (ref 11.6–15.9)
LYMPH#: 1 10*3/uL (ref 0.9–3.3)
LYMPH%: 24.5 % (ref 14.0–48.0)
MCH: 26.5 pg (ref 26.0–34.0)
MCHC: 32.9 g/dL (ref 32.0–36.0)
MCV: 81 fL (ref 81–101)
MONO#: 0.9 10*3/uL (ref 0.1–0.9)
MONO%: 21.4 % — ABNORMAL HIGH (ref 0.0–13.0)
NEUT#: 2 10*3/uL (ref 1.5–6.5)
NEUT%: 46.7 % (ref 39.6–80.0)
Platelets: 265 10*3/uL (ref 145–400)
RBC: 3.7 10*6/uL (ref 3.70–5.32)
RDW: 17.9 % — ABNORMAL HIGH (ref 11.1–15.7)
WBC: 4.2 10*3/uL (ref 3.9–10.0)

## 2013-11-20 MED ORDER — BORTEZOMIB CHEMO SQ INJECTION 3.5 MG (2.5MG/ML)
1.3000 mg/m2 | Freq: Once | INTRAMUSCULAR | Status: AC
  Administered 2013-11-20: 2.75 mg via SUBCUTANEOUS
  Filled 2013-11-20: qty 2.75

## 2013-11-20 MED ORDER — ONDANSETRON HCL 8 MG PO TABS
ORAL_TABLET | ORAL | Status: AC
  Filled 2013-11-20: qty 1

## 2013-11-20 MED ORDER — ONDANSETRON HCL 8 MG PO TABS
8.0000 mg | ORAL_TABLET | Freq: Once | ORAL | Status: AC
  Administered 2013-11-20: 8 mg via ORAL

## 2013-11-20 NOTE — Patient Instructions (Signed)
[facility] Discharge Instructions for Patients Receiving Chemotherapy  Today you received the following chemotherapy agents: Velcade. To help prevent nausea and vomiting after your treatment, we encourage you to take your nausea medication.  If you develop nausea and vomiting that is not controlled by your nausea medication, call the clinic.   BELOW ARE SYMPTOMS THAT SHOULD BE REPORTED IMMEDIATELY:  *FEVER GREATER THAN 100.5 F  *CHILLS WITH OR WITHOUT FEVER  NAUSEA AND VOMITING THAT IS NOT CONTROLLED WITH YOUR NAUSEA MEDICATION  *UNUSUAL SHORTNESS OF BREATH  *UNUSUAL BRUISING OR BLEEDING  TENDERNESS IN MOUTH AND THROAT WITH OR WITHOUT PRESENCE OF ULCERS  *URINARY PROBLEMS  *BOWEL PROBLEMS  UNUSUAL RASH Items with * indicate a potential emergency and should be followed up as soon as possible.  Feel free to call the clinic you have any questions or concerns. The clinic phone number is [phone].    

## 2013-12-01 ENCOUNTER — Other Ambulatory Visit: Payer: Self-pay | Admitting: *Deleted

## 2013-12-01 ENCOUNTER — Encounter: Payer: Self-pay | Admitting: Hematology & Oncology

## 2013-12-01 DIAGNOSIS — C9000 Multiple myeloma not having achieved remission: Secondary | ICD-10-CM

## 2013-12-01 MED ORDER — POMALIDOMIDE 4 MG PO CAPS: 4.0000 mg | ORAL_CAPSULE | Freq: Every day | ORAL | Status: DC

## 2013-12-03 ENCOUNTER — Other Ambulatory Visit (HOSPITAL_BASED_OUTPATIENT_CLINIC_OR_DEPARTMENT_OTHER): Payer: BC Managed Care – PPO | Admitting: Lab

## 2013-12-03 ENCOUNTER — Ambulatory Visit: Payer: BC Managed Care – PPO | Admitting: Family

## 2013-12-03 ENCOUNTER — Ambulatory Visit (HOSPITAL_BASED_OUTPATIENT_CLINIC_OR_DEPARTMENT_OTHER): Payer: BC Managed Care – PPO

## 2013-12-03 VITALS — BP 133/69 | HR 84 | Temp 98.6°F | Resp 18

## 2013-12-03 DIAGNOSIS — C9000 Multiple myeloma not having achieved remission: Secondary | ICD-10-CM

## 2013-12-03 DIAGNOSIS — Z5112 Encounter for antineoplastic immunotherapy: Secondary | ICD-10-CM

## 2013-12-03 LAB — CBC WITH DIFFERENTIAL (CANCER CENTER ONLY)
BASO#: 0.2 10*3/uL (ref 0.0–0.2)
BASO%: 3.5 % — ABNORMAL HIGH (ref 0.0–2.0)
EOS%: 5.9 % (ref 0.0–7.0)
Eosinophils Absolute: 0.3 10*3/uL (ref 0.0–0.5)
HCT: 32.7 % — ABNORMAL LOW (ref 34.8–46.6)
HGB: 10.7 g/dL — ABNORMAL LOW (ref 11.6–15.9)
LYMPH#: 0.9 10*3/uL (ref 0.9–3.3)
LYMPH%: 20.7 % (ref 14.0–48.0)
MCH: 26.6 pg (ref 26.0–34.0)
MCHC: 32.7 g/dL (ref 32.0–36.0)
MCV: 81 fL (ref 81–101)
MONO#: 0.4 10*3/uL (ref 0.1–0.9)
MONO%: 10.1 % (ref 0.0–13.0)
NEUT#: 2.5 10*3/uL (ref 1.5–6.5)
NEUT%: 59.8 % (ref 39.6–80.0)
Platelets: 315 10*3/uL (ref 145–400)
RBC: 4.02 10*6/uL (ref 3.70–5.32)
RDW: 18.1 % — ABNORMAL HIGH (ref 11.1–15.7)
WBC: 4.3 10*3/uL (ref 3.9–10.0)

## 2013-12-03 MED ORDER — ONDANSETRON HCL 8 MG PO TABS
ORAL_TABLET | ORAL | Status: AC
  Filled 2013-12-03: qty 1

## 2013-12-03 MED ORDER — SODIUM CHLORIDE 0.9 % IV SOLN
Freq: Once | INTRAVENOUS | Status: AC
  Administered 2013-12-03: 10:00:00 via INTRAVENOUS

## 2013-12-03 MED ORDER — BORTEZOMIB CHEMO SQ INJECTION 3.5 MG (2.5MG/ML)
1.3000 mg/m2 | Freq: Once | INTRAMUSCULAR | Status: AC
  Administered 2013-12-03: 2.75 mg via SUBCUTANEOUS
  Filled 2013-12-03: qty 2.75

## 2013-12-03 MED ORDER — SODIUM CHLORIDE 0.9 % IV SOLN
90.0000 mg | Freq: Once | INTRAVENOUS | Status: AC
Start: 2013-12-03 — End: 2013-12-03
  Administered 2013-12-03: 90 mg via INTRAVENOUS
  Filled 2013-12-03: qty 10

## 2013-12-03 MED ORDER — ONDANSETRON HCL 8 MG PO TABS
8.0000 mg | ORAL_TABLET | Freq: Once | ORAL | Status: AC
  Administered 2013-12-03: 8 mg via ORAL

## 2013-12-03 NOTE — Patient Instructions (Signed)
[facility] Discharge Instructions for Patients Receiving Chemotherapy  Today you received the following chemotherapy agents Velcade,  To help prevent nausea and vomiting after your treatment, we encourage you to take your nausea medication    If you develop nausea and vomiting that is not controlled by your nausea medication, call the clinic.   BELOW ARE SYMPTOMS THAT SHOULD BE REPORTED IMMEDIATELY:  *FEVER GREATER THAN 100.5 F  *CHILLS WITH OR WITHOUT FEVER  NAUSEA AND VOMITING THAT IS NOT CONTROLLED WITH YOUR NAUSEA MEDICATION  *UNUSUAL SHORTNESS OF BREATH  *UNUSUAL BRUISING OR BLEEDING  TENDERNESS IN MOUTH AND THROAT WITH OR WITHOUT PRESENCE OF ULCERS  *URINARY PROBLEMS  *BOWEL PROBLEMS  UNUSUAL RASH Items with * indicate a potential emergency and should be followed up as soon as possible.  Feel free to call the clinic you have any questions or concerns. The clinic phone number is [phone].

## 2013-12-04 LAB — KAPPA/LAMBDA LIGHT CHAINS
Kappa free light chain: 0.93 mg/dL (ref 0.33–1.94)
Kappa:Lambda Ratio: 0.11 — ABNORMAL LOW (ref 0.26–1.65)
Lambda Free Lght Chn: 8.78 mg/dL — ABNORMAL HIGH (ref 0.57–2.63)

## 2013-12-04 LAB — COMPREHENSIVE METABOLIC PANEL
ALT: 12 U/L (ref 0–35)
AST: 12 U/L (ref 0–37)
Albumin: 4 g/dL (ref 3.5–5.2)
Alkaline Phosphatase: 198 U/L — ABNORMAL HIGH (ref 39–117)
BUN: 11 mg/dL (ref 6–23)
CO2: 20 mEq/L (ref 19–32)
Calcium: 9.3 mg/dL (ref 8.4–10.5)
Chloride: 105 mEq/L (ref 96–112)
Creatinine, Ser: 0.87 mg/dL (ref 0.50–1.10)
Glucose, Bld: 89 mg/dL (ref 70–99)
Potassium: 3.8 mEq/L (ref 3.5–5.3)
Sodium: 140 mEq/L (ref 135–145)
Total Bilirubin: 0.4 mg/dL (ref 0.2–1.2)
Total Protein: 6.3 g/dL (ref 6.0–8.3)

## 2013-12-10 ENCOUNTER — Ambulatory Visit (HOSPITAL_BASED_OUTPATIENT_CLINIC_OR_DEPARTMENT_OTHER): Payer: BC Managed Care – PPO

## 2013-12-10 ENCOUNTER — Other Ambulatory Visit (HOSPITAL_BASED_OUTPATIENT_CLINIC_OR_DEPARTMENT_OTHER): Payer: BC Managed Care – PPO | Admitting: Lab

## 2013-12-10 DIAGNOSIS — Z5112 Encounter for antineoplastic immunotherapy: Secondary | ICD-10-CM

## 2013-12-10 DIAGNOSIS — C9000 Multiple myeloma not having achieved remission: Secondary | ICD-10-CM

## 2013-12-10 LAB — CBC WITH DIFFERENTIAL (CANCER CENTER ONLY)
BASO#: 0.2 10*3/uL (ref 0.0–0.2)
BASO%: 3.7 % — ABNORMAL HIGH (ref 0.0–2.0)
EOS%: 6.3 % (ref 0.0–7.0)
Eosinophils Absolute: 0.3 10*3/uL (ref 0.0–0.5)
HCT: 32.5 % — ABNORMAL LOW (ref 34.8–46.6)
HGB: 10.6 g/dL — ABNORMAL LOW (ref 11.6–15.9)
LYMPH#: 0.9 10*3/uL (ref 0.9–3.3)
LYMPH%: 21.2 % (ref 14.0–48.0)
MCH: 26.2 pg (ref 26.0–34.0)
MCHC: 32.6 g/dL (ref 32.0–36.0)
MCV: 80 fL — ABNORMAL LOW (ref 81–101)
MONO#: 1.1 10*3/uL — ABNORMAL HIGH (ref 0.1–0.9)
MONO%: 24.7 % — ABNORMAL HIGH (ref 0.0–13.0)
NEUT#: 1.9 10*3/uL (ref 1.5–6.5)
NEUT%: 44.1 % (ref 39.6–80.0)
Platelets: 277 10*3/uL (ref 145–400)
RBC: 4.04 10*6/uL (ref 3.70–5.32)
RDW: 17.5 % — ABNORMAL HIGH (ref 11.1–15.7)
WBC: 4.3 10*3/uL (ref 3.9–10.0)

## 2013-12-10 LAB — BASIC METABOLIC PANEL - CANCER CENTER ONLY
BUN, Bld: 15 mg/dL (ref 7–22)
CO2: 27 mEq/L (ref 18–33)
Calcium: 9 mg/dL (ref 8.0–10.3)
Chloride: 99 mEq/L (ref 98–108)
Creat: 1.2 mg/dl (ref 0.6–1.2)
Glucose, Bld: 91 mg/dL (ref 73–118)
Potassium: 3.4 mEq/L (ref 3.3–4.7)
Sodium: 141 mEq/L (ref 128–145)

## 2013-12-10 MED ORDER — BORTEZOMIB CHEMO SQ INJECTION 3.5 MG (2.5MG/ML)
1.3000 mg/m2 | Freq: Once | INTRAMUSCULAR | Status: AC
  Administered 2013-12-10: 2.75 mg via SUBCUTANEOUS
  Filled 2013-12-10: qty 2.75

## 2013-12-10 MED ORDER — ONDANSETRON HCL 8 MG PO TABS
8.0000 mg | ORAL_TABLET | Freq: Once | ORAL | Status: AC
Start: 2013-12-10 — End: 2013-12-10
  Administered 2013-12-10: 8 mg via ORAL

## 2013-12-10 MED ORDER — ONDANSETRON HCL 8 MG PO TABS
ORAL_TABLET | ORAL | Status: AC
  Filled 2013-12-10: qty 1

## 2013-12-10 NOTE — Patient Instructions (Signed)
[facility] Discharge Instructions for Patients Receiving Chemotherapy  Today you received the following chemotherapy agents Velcade,  To help prevent nausea and vomiting after your treatment, we encourage you to take your nausea medication    If you develop nausea and vomiting that is not controlled by your nausea medication, call the clinic.   BELOW ARE SYMPTOMS THAT SHOULD BE REPORTED IMMEDIATELY:  *FEVER GREATER THAN 100.5 F  *CHILLS WITH OR WITHOUT FEVER  NAUSEA AND VOMITING THAT IS NOT CONTROLLED WITH YOUR NAUSEA MEDICATION  *UNUSUAL SHORTNESS OF BREATH  *UNUSUAL BRUISING OR BLEEDING  TENDERNESS IN MOUTH AND THROAT WITH OR WITHOUT PRESENCE OF ULCERS  *URINARY PROBLEMS  *BOWEL PROBLEMS  UNUSUAL RASH Items with * indicate a potential emergency and should be followed up as soon as possible.  Feel free to call the clinic you have any questions or concerns. The clinic phone number is [phone].

## 2013-12-17 ENCOUNTER — Ambulatory Visit (HOSPITAL_BASED_OUTPATIENT_CLINIC_OR_DEPARTMENT_OTHER): Payer: BC Managed Care – PPO | Admitting: Lab

## 2013-12-17 ENCOUNTER — Ambulatory Visit (HOSPITAL_BASED_OUTPATIENT_CLINIC_OR_DEPARTMENT_OTHER): Payer: BC Managed Care – PPO

## 2013-12-17 DIAGNOSIS — C9000 Multiple myeloma not having achieved remission: Secondary | ICD-10-CM

## 2013-12-17 DIAGNOSIS — Z5112 Encounter for antineoplastic immunotherapy: Secondary | ICD-10-CM

## 2013-12-17 LAB — CBC WITH DIFFERENTIAL (CANCER CENTER ONLY)
BASO#: 0.1 10*3/uL (ref 0.0–0.2)
BASO%: 3.7 % — ABNORMAL HIGH (ref 0.0–2.0)
EOS%: 7 % (ref 0.0–7.0)
Eosinophils Absolute: 0.2 10*3/uL (ref 0.0–0.5)
HCT: 32 % — ABNORMAL LOW (ref 34.8–46.6)
HGB: 10.3 g/dL — ABNORMAL LOW (ref 11.6–15.9)
LYMPH#: 0.8 10*3/uL — ABNORMAL LOW (ref 0.9–3.3)
LYMPH%: 29.2 % (ref 14.0–48.0)
MCH: 26 pg (ref 26.0–34.0)
MCHC: 32.2 g/dL (ref 32.0–36.0)
MCV: 81 fL (ref 81–101)
MONO#: 0.8 10*3/uL (ref 0.1–0.9)
MONO%: 30.6 % — ABNORMAL HIGH (ref 0.0–13.0)
NEUT#: 0.8 10*3/uL — ABNORMAL LOW (ref 1.5–6.5)
NEUT%: 29.5 % — ABNORMAL LOW (ref 39.6–80.0)
Platelets: 219 10*3/uL (ref 145–400)
RBC: 3.96 10*6/uL (ref 3.70–5.32)
RDW: 17.7 % — ABNORMAL HIGH (ref 11.1–15.7)
WBC: 2.7 10*3/uL — ABNORMAL LOW (ref 3.9–10.0)

## 2013-12-17 LAB — CMP (CANCER CENTER ONLY)
ALT(SGPT): 23 U/L (ref 10–47)
AST: 20 U/L (ref 11–38)
Albumin: 3.6 g/dL (ref 3.3–5.5)
Alkaline Phosphatase: 117 U/L — ABNORMAL HIGH (ref 26–84)
BUN, Bld: 14 mg/dL (ref 7–22)
CO2: 27 mEq/L (ref 18–33)
Calcium: 9.3 mg/dL (ref 8.0–10.3)
Chloride: 101 mEq/L (ref 98–108)
Creat: 1 mg/dl (ref 0.6–1.2)
Glucose, Bld: 89 mg/dL (ref 73–118)
Potassium: 3.5 mEq/L (ref 3.3–4.7)
Sodium: 142 mEq/L (ref 128–145)
Total Bilirubin: 0.6 mg/dl (ref 0.20–1.60)
Total Protein: 6.5 g/dL (ref 6.4–8.1)

## 2013-12-17 MED ORDER — BORTEZOMIB CHEMO SQ INJECTION 3.5 MG (2.5MG/ML)
1.3000 mg/m2 | Freq: Once | INTRAMUSCULAR | Status: AC
  Administered 2013-12-17: 2.75 mg via SUBCUTANEOUS
  Filled 2013-12-17: qty 2.75

## 2013-12-17 MED ORDER — ONDANSETRON HCL 8 MG PO TABS
8.0000 mg | ORAL_TABLET | Freq: Once | ORAL | Status: AC
  Administered 2013-12-17: 8 mg via ORAL

## 2013-12-17 MED ORDER — ONDANSETRON HCL 8 MG PO TABS
ORAL_TABLET | ORAL | Status: AC
  Filled 2013-12-17: qty 1

## 2013-12-17 NOTE — Patient Instructions (Signed)
Bortezomib injection What is this medicine? BORTEZOMIB (bor TEZ oh mib) is a chemotherapy drug. It slows the growth of cancer cells. This medicine is used to treat multiple myeloma, and certain lymphomas, such as mantle-cell lymphoma. This medicine may be used for other purposes; ask your health care provider or pharmacist if you have questions. COMMON BRAND NAME(S): Velcade What should I tell my health care provider before I take this medicine? They need to know if you have any of these conditions: -diabetes -heart disease -irregular heartbeat -liver disease -on hemodialysis -low blood counts, like low white blood cells, platelets, or hemoglobin -peripheral neuropathy -taking medicine for blood pressure -an unusual or allergic reaction to bortezomib, mannitol, boron, other medicines, foods, dyes, or preservatives -pregnant or trying to get pregnant -breast-feeding How should I use this medicine? This medicine is for injection into a vein or for injection under the skin. It is given by a health care professional in a hospital or clinic setting. Talk to your pediatrician regarding the use of this medicine in children. Special care may be needed. Overdosage: If you think you have taken too much of this medicine contact a poison control center or emergency room at once. NOTE: This medicine is only for you. Do not share this medicine with others. What if I miss a dose? It is important not to miss your dose. Call your doctor or health care professional if you are unable to keep an appointment. What may interact with this medicine? This medicine may interact with the following medications: -ketoconazole -rifampin -ritonavir -St. John's Wort This list may not describe all possible interactions. Give your health care provider a list of all the medicines, herbs, non-prescription drugs, or dietary supplements you use. Also tell them if you smoke, drink alcohol, or use illegal drugs. Some items  may interact with your medicine. What should I watch for while using this medicine? Visit your doctor for checks on your progress. This drug may make you feel generally unwell. This is not uncommon, as chemotherapy can affect healthy cells as well as cancer cells. Report any side effects. Continue your course of treatment even though you feel ill unless your doctor tells you to stop. You may get drowsy or dizzy. Do not drive, use machinery, or do anything that needs mental alertness until you know how this medicine affects you. Do not stand or sit up quickly, especially if you are an older patient. This reduces the risk of dizzy or fainting spells. In some cases, you may be given additional medicines to help with side effects. Follow all directions for their use. Call your doctor or health care professional for advice if you get a fever, chills or sore throat, or other symptoms of a cold or flu. Do not treat yourself. This drug decreases your body's ability to fight infections. Try to avoid being around people who are sick. This medicine may increase your risk to bruise or bleed. Call your doctor or health care professional if you notice any unusual bleeding. You may need blood work done while you are taking this medicine. In some patients, this medicine may cause a serious brain infection that may cause death. If you have any problems seeing, thinking, speaking, walking, or standing, tell your doctor right away. If you cannot reach your doctor, urgently seek other source of medical care. Do not become pregnant while taking this medicine. Women should inform their doctor if they wish to become pregnant or think they might be pregnant. There is  a potential for serious side effects to an unborn child. Talk to your health care professional or pharmacist for more information. Do not breast-feed an infant while taking this medicine. Check with your doctor or health care professional if you get an attack of  severe diarrhea, nausea and vomiting, or if you sweat a lot. The loss of too much body fluid can make it dangerous for you to take this medicine. What side effects may I notice from receiving this medicine? Side effects that you should report to your doctor or health care professional as soon as possible: -allergic reactions like skin rash, itching or hives, swelling of the face, lips, or tongue -breathing problems -changes in hearing -changes in vision -fast, irregular heartbeat -feeling faint or lightheaded, falls -pain, tingling, numbness in the hands or feet -right upper belly pain -seizures -swelling of the ankles, feet, hands -unusual bleeding or bruising -unusually weak or tired -vomiting -yellowing of the eyes or skin Side effects that usually do not require medical attention (report to your doctor or health care professional if they continue or are bothersome): -changes in emotions or moods -constipation -diarrhea -loss of appetite -headache -irritation at site where injected -nausea This list may not describe all possible side effects. Call your doctor for medical advice about side effects. You may report side effects to FDA at 1-800-FDA-1088. Where should I keep my medicine? This drug is given in a hospital or clinic and will not be stored at home. NOTE: This sheet is a summary. It may not cover all possible information. If you have questions about this medicine, talk to your doctor, pharmacist, or health care provider.  2015, [employer]vier/[employer] Standard. (2012-11-11 12:46:32)

## 2013-12-17 NOTE — Progress Notes (Signed)
Dr Marin Olp aware that [place] is 0.8 and he states he is okay to treat pt today with Velcade.

## 2013-12-20 LAB — PROTEIN ELECTROPHORESIS, SERUM, WITH REFLEX
Albumin ELP: 62.9 % (ref 55.8–66.1)
Alpha-1-Globulin: 4 % (ref 2.9–4.9)
Alpha-2-Globulin: 10.7 % (ref 7.1–11.8)
Beta 2: 5.4 % (ref 3.2–6.5)
Beta Globulin: 7.9 % — ABNORMAL HIGH (ref 4.7–7.2)
Gamma Globulin: 9.1 % — ABNORMAL LOW (ref 11.1–18.8)
M-Spike, %: 0.13 g/dL
Total Protein, Serum Electrophoresis: 6 g/dL (ref 6.0–8.3)

## 2013-12-20 LAB — IFE INTERPRETATION

## 2013-12-20 LAB — IGG, IGA, IGM
IgA: 75 mg/dL (ref 69–380)
IgG (Immunoglobin G), Serum: 464 mg/dL — ABNORMAL LOW (ref 690–1700)
IgM, Serum: 50 mg/dL — ABNORMAL LOW (ref 52–322)

## 2013-12-29 ENCOUNTER — Other Ambulatory Visit: Payer: Self-pay | Admitting: Hematology & Oncology

## 2013-12-31 ENCOUNTER — Other Ambulatory Visit: Payer: Self-pay | Admitting: Hematology & Oncology

## 2013-12-31 ENCOUNTER — Ambulatory Visit (HOSPITAL_BASED_OUTPATIENT_CLINIC_OR_DEPARTMENT_OTHER): Payer: BC Managed Care – PPO | Admitting: Hematology & Oncology

## 2013-12-31 ENCOUNTER — Ambulatory Visit (HOSPITAL_BASED_OUTPATIENT_CLINIC_OR_DEPARTMENT_OTHER): Payer: BC Managed Care – PPO | Admitting: Lab

## 2013-12-31 ENCOUNTER — Ambulatory Visit (HOSPITAL_BASED_OUTPATIENT_CLINIC_OR_DEPARTMENT_OTHER): Payer: BC Managed Care – PPO

## 2013-12-31 ENCOUNTER — Encounter: Payer: Self-pay | Admitting: Hematology & Oncology

## 2013-12-31 VITALS — BP 135/65 | HR 85 | Temp 98.2°F | Resp 14 | Ht 63.0 in | Wt 210.0 lb

## 2013-12-31 DIAGNOSIS — R600 Localized edema: Secondary | ICD-10-CM

## 2013-12-31 DIAGNOSIS — Z5112 Encounter for antineoplastic immunotherapy: Secondary | ICD-10-CM

## 2013-12-31 DIAGNOSIS — C9002 Multiple myeloma in relapse: Secondary | ICD-10-CM

## 2013-12-31 DIAGNOSIS — C9000 Multiple myeloma not having achieved remission: Secondary | ICD-10-CM

## 2013-12-31 DIAGNOSIS — Z8579 Personal history of other malignant neoplasms of lymphoid, hematopoietic and related tissues: Secondary | ICD-10-CM

## 2013-12-31 LAB — CMP (CANCER CENTER ONLY)
ALT(SGPT): 20 U/L (ref 10–47)
AST: 22 U/L (ref 11–38)
Albumin: 3.7 g/dL (ref 3.3–5.5)
Alkaline Phosphatase: 94 U/L — ABNORMAL HIGH (ref 26–84)
BUN, Bld: 13 mg/dL (ref 7–22)
CO2: 27 mEq/L (ref 18–33)
Calcium: 9.4 mg/dL (ref 8.0–10.3)
Chloride: 101 mEq/L (ref 98–108)
Creat: 0.8 mg/dl (ref 0.6–1.2)
Glucose, Bld: 91 mg/dL (ref 73–118)
Potassium: 3.5 mEq/L (ref 3.3–4.7)
Sodium: 140 mEq/L (ref 128–145)
Total Bilirubin: 0.7 mg/dl (ref 0.20–1.60)
Total Protein: 6.8 g/dL (ref 6.4–8.1)

## 2013-12-31 LAB — CBC WITH DIFFERENTIAL (CANCER CENTER ONLY)
BASO#: 0.1 10*3/uL (ref 0.0–0.2)
BASO%: 2.8 % — ABNORMAL HIGH (ref 0.0–2.0)
EOS%: 3.3 % (ref 0.0–7.0)
Eosinophils Absolute: 0.1 10*3/uL (ref 0.0–0.5)
HCT: 34 % — ABNORMAL LOW (ref 34.8–46.6)
HGB: 10.9 g/dL — ABNORMAL LOW (ref 11.6–15.9)
LYMPH#: 0.8 10*3/uL — ABNORMAL LOW (ref 0.9–3.3)
LYMPH%: 21.8 % (ref 14.0–48.0)
MCH: 26.2 pg (ref 26.0–34.0)
MCHC: 32.1 g/dL (ref 32.0–36.0)
MCV: 82 fL (ref 81–101)
MONO#: 0.5 10*3/uL (ref 0.1–0.9)
MONO%: 14 % — ABNORMAL HIGH (ref 0.0–13.0)
NEUT#: 2.1 10*3/uL (ref 1.5–6.5)
NEUT%: 58.1 % (ref 39.6–80.0)
Platelets: 335 10*3/uL (ref 145–400)
RBC: 4.16 10*6/uL (ref 3.70–5.32)
RDW: 17.8 % — ABNORMAL HIGH (ref 11.1–15.7)
WBC: 3.6 10*3/uL — ABNORMAL LOW (ref 3.9–10.0)

## 2013-12-31 LAB — TECHNOLOGIST REVIEW CHCC SATELLITE

## 2013-12-31 MED ORDER — FUROSEMIDE 20 MG PO TABS: ORAL_TABLET | ORAL | Status: DC

## 2013-12-31 MED ORDER — SODIUM CHLORIDE 0.9 % IV SOLN
Freq: Once | INTRAVENOUS | Status: AC
  Administered 2013-12-31: 12:00:00 via INTRAVENOUS

## 2013-12-31 MED ORDER — SODIUM CHLORIDE 0.9 % IV SOLN
90.0000 mg | Freq: Once | INTRAVENOUS | Status: AC
  Administered 2013-12-31: 90 mg via INTRAVENOUS
  Filled 2013-12-31: qty 10

## 2013-12-31 MED ORDER — BORTEZOMIB CHEMO SQ INJECTION 3.5 MG (2.5MG/ML)
1.3000 mg/m2 | Freq: Once | INTRAMUSCULAR | Status: AC
  Administered 2013-12-31: 2.75 mg via SUBCUTANEOUS
  Filled 2013-12-31: qty 2.75

## 2013-12-31 MED ORDER — ONDANSETRON HCL 8 MG PO TABS
8.0000 mg | ORAL_TABLET | Freq: Once | ORAL | Status: AC
  Administered 2013-12-31: 8 mg via ORAL

## 2013-12-31 MED ORDER — ONDANSETRON HCL 8 MG PO TABS
ORAL_TABLET | ORAL | Status: AC
  Filled 2013-12-31: qty 1

## 2013-12-31 NOTE — Patient Instructions (Signed)
Bortezomib injection What is this medicine? BORTEZOMIB (bor TEZ oh mib) is a chemotherapy drug. It slows the growth of cancer cells. This medicine is used to treat multiple myeloma, and certain lymphomas, such as mantle-cell lymphoma. This medicine may be used for other purposes; ask your health care provider or pharmacist if you have questions. COMMON BRAND NAME(S): Velcade What should I tell my health care provider before I take this medicine? They need to know if you have any of these conditions: -diabetes -heart disease -irregular heartbeat -liver disease -on hemodialysis -low blood counts, like low white blood cells, platelets, or hemoglobin -peripheral neuropathy -taking medicine for blood pressure -an unusual or allergic reaction to bortezomib, mannitol, boron, other medicines, foods, dyes, or preservatives -pregnant or trying to get pregnant -breast-feeding How should I use this medicine? This medicine is for injection into a vein or for injection under the skin. It is given by a health care professional in a hospital or clinic setting. Talk to your pediatrician regarding the use of this medicine in children. Special care may be needed. Overdosage: If you think you have taken too much of this medicine contact a poison control center or emergency room at once. NOTE: This medicine is only for you. Do not share this medicine with others. What if I miss a dose? It is important not to miss your dose. Call your doctor or health care professional if you are unable to keep an appointment. What may interact with this medicine? This medicine may interact with the following medications: -ketoconazole -rifampin -ritonavir -St. John's Wort This list may not describe all possible interactions. Give your health care provider a list of all the medicines, herbs, non-prescription drugs, or dietary supplements you use. Also tell them if you smoke, drink alcohol, or use illegal drugs. Some items  may interact with your medicine. What should I watch for while using this medicine? Visit your doctor for checks on your progress. This drug may make you feel generally unwell. This is not uncommon, as chemotherapy can affect healthy cells as well as cancer cells. Report any side effects. Continue your course of treatment even though you feel ill unless your doctor tells you to stop. You may get drowsy or dizzy. Do not drive, use machinery, or do anything that needs mental alertness until you know how this medicine affects you. Do not stand or sit up quickly, especially if you are an older patient. This reduces the risk of dizzy or fainting spells. In some cases, you may be given additional medicines to help with side effects. Follow all directions for their use. Call your doctor or health care professional for advice if you get a fever, chills or sore throat, or other symptoms of a cold or flu. Do not treat yourself. This drug decreases your body's ability to fight infections. Try to avoid being around people who are sick. This medicine may increase your risk to bruise or bleed. Call your doctor or health care professional if you notice any unusual bleeding. You may need blood work done while you are taking this medicine. In some patients, this medicine may cause a serious brain infection that may cause death. If you have any problems seeing, thinking, speaking, walking, or standing, tell your doctor right away. If you cannot reach your doctor, urgently seek other source of medical care. Do not become pregnant while taking this medicine. Women should inform their doctor if they wish to become pregnant or think they might be pregnant. There is   a potential for serious side effects to an unborn child. Talk to your health care professional or pharmacist for more information. Do not breast-feed an infant while taking this medicine. Check with your doctor or health care professional if you get an attack of  severe diarrhea, nausea and vomiting, or if you sweat a lot. The loss of too much body fluid can make it dangerous for you to take this medicine. What side effects may I notice from receiving this medicine? Side effects that you should report to your doctor or health care professional as soon as possible: -allergic reactions like skin rash, itching or hives, swelling of the face, lips, or tongue -breathing problems -changes in hearing -changes in vision -fast, irregular heartbeat -feeling faint or lightheaded, falls -pain, tingling, numbness in the hands or feet -right upper belly pain -seizures -swelling of the ankles, feet, hands -unusual bleeding or bruising -unusually weak or tired -vomiting -yellowing of the eyes or skin Side effects that usually do not require medical attention (report to your doctor or health care professional if they continue or are bothersome): -changes in emotions or moods -constipation -diarrhea -loss of appetite -headache -irritation at site where injected -nausea This list may not describe all possible side effects. Call your doctor for medical advice about side effects. You may report side effects to FDA at 1-800-FDA-1088. Where should I keep my medicine? This drug is given in a hospital or clinic and will not be stored at home. NOTE: This sheet is a summary. It may not cover all possible information. If you have questions about this medicine, talk to your doctor, pharmacist, or health care provider.  2015, [employer] Standard. (2012-11-11 12:46:32) Pamidronate injection What is this medicine? PAMIDRONATE (pa mi DROE nate) slows calcium loss from bones. It is used to treat high calcium blood levels from cancer or Paget's disease. It is also used to treat bone pain and prevent fractures from certain cancers that have spread to the bone. This medicine may be used for other purposes; ask your health care provider or pharmacist if you have  questions. COMMON BRAND NAME(S): Aredia What should I tell my health care provider before I take this medicine? They need to know if you have any of these conditions: -aspirin-sensitive asthma -dental disease -kidney disease -an unusual or allergic reaction to pamidronate, other medicines, foods, dyes, or preservatives -pregnant or trying to get pregnant -breast-feeding How should I use this medicine? This medicine is for infusion into a vein. It is given by a health care professional in a hospital or clinic setting. Talk to your pediatrician regarding the use of this medicine in children. This medicine is not approved for use in children. Overdosage: If you think you have taken too much of this medicine contact a poison control center or emergency room at once. NOTE: This medicine is only for you. Do not share this medicine with others. What if I miss a dose? This does not apply. What may interact with this medicine? -certain antibiotics given by injection -medicines for inflammation or pain like ibuprofen, naproxen -some diuretics like bumetanide, furosemide -cyclosporine -parathyroid hormone -tacrolimus -teriparatide -thalidomide This list may not describe all possible interactions. Give your health care provider a list of all the medicines, herbs, non-prescription drugs, or dietary supplements you use. Also tell them if you smoke, drink alcohol, or use illegal drugs. Some items may interact with your medicine. What should I watch for while using this medicine? Visit your doctor or health care professional  for regular checkups. It may be some time before you see the benefit from this medicine. Do not stop taking your medicine unless your doctor tells you to. Your doctor may order blood tests or other tests to see how you are doing. Women should inform their doctor if they wish to become pregnant or think they might be pregnant. There is a potential for serious side effects to an  unborn child. Talk to your health care professional or pharmacist for more information. You should make sure that you get enough calcium and vitamin D while you are taking this medicine. Discuss the foods you eat and the vitamins you take with your health care professional. Some people who take this medicine have severe bone, joint, and/or muscle pain. This medicine may also increase your risk for a broken thigh bone. Tell your doctor right away if you have pain in your upper leg or groin. Tell your doctor if you have any pain that does not go away or that gets worse. What side effects may I notice from receiving this medicine? Side effects that you should report to your doctor or health care professional as soon as possible: -allergic reactions like skin rash, itching or hives, swelling of the face, lips, or tongue -black or tarry stools -changes in vision -eye inflammation, pain -high blood pressure -jaw pain, especially burning or cramping -muscle weakness -numb, tingling pain -swelling of feet or hands -trouble passing urine or change in the amount of urine -unable to move easily Side effects that usually do not require medical attention (report to your doctor or health care professional if they continue or are bothersome): -bone, joint, or muscle pain -constipation -dizzy, drowsy -fever -headache -loss of appetite -nausea, vomiting -pain at site where injected This list may not describe all possible side effects. Call your doctor for medical advice about side effects. You may report side effects to FDA at 1-800-FDA-1088. Where should I keep my medicine? This drug is given in a hospital or clinic and will not be stored at home. NOTE: This sheet is a summary. It may not cover all possible information. If you have questions about this medicine, talk to your doctor, pharmacist, or health care provider.  2015, [employer] Standard. (2010-07-15 08:49:49)

## 2013-12-31 NOTE — Progress Notes (Signed)
Hematology and Oncology Follow Up Visit  AVERIE Greer [id number] 10-08-1950 63 y.o. 12/31/2013   Principle Diagnosis:   Recurrent lambda light chain myeloma  History of stage I endometrial carcinoma  Current Therapy:    Pomalidomide/Velcade  Aredia 60 mg IV Q. month     Interim History:  [name] is back for followup. She is now a new grandmother. Her son and daughter-in-law had the first baby in [place]. The baby was about 2 months early. However, he seems to be doing okay.  Hopefully, she'll be able to go out to visit in a couple months or so when he is home and healthier.   She started the Velcade a few weeks ago. So far, she's not had any problems with it. There's been no nausea or vomiting. She's had a problem with bowels or bladder. There's been no skin rashes. She's had no cough. There's been no mouth sores. She's had no neuropathy in her hands or feet.  We did go ahead and get a bone marrow biopsy on her. This was done before she went to [place]. The pathology report ([id number]) shows 21% plasma cells in the bone marrow. The cytogenetics were all normal.   I think that should that he is going to end up needing a second stem cell transplant. Her first transplant was over 10 years ago. She obviously got a lot of mileage out of that.   She's done well with the Pomalidomide.  I told her that we can check her blood and her urine, both, to see how well she's done.   Her last bone scan was done back in July. Her last bone survey showed stable lytic lesions. She was not having any problems with bony pain. She is most worried about her weight. I  Overall, her performance status is ECOG 1 Medications: Allergies:  Allergies  Allergen Reactions  . Codeine Nausea Only    Past Medical History, Surgical history, Social history, and Family History were reviewed and updated.  Review of Systems: As above  Physical Exam:  height is $RemoveB'5\' 3"'HHilQkGE$  (1.6 m) and weight is 210 lb  (95.255 kg). Her oral temperature is 98.2 F (36.8 C). Her blood pressure is 135/65 and her pulse is 85. Her respiration is 14.   Well-developed and well-nourished [name]. Head and neck exam shows no ocular or oral lesion. There are no palpable cervical or supraclavicular lymph nodes. Lungs are clear. Cardiac exam regular rate and rhythm with no murmurs rubs or bruits. Abdomen is soft. She has good bowel sounds. There is no fluid wave. There is no palpable liver or spleen tip. Extremities shows no clubbing, cyanosis or edema. Neurological exam shows no focal neurological deficits. Skin exam no rashes, ecchymosis or petechia.  Lab Results  Component Value Date   WBC 3.6* 12/31/2013   HGB 10.9* 12/31/2013   HCT 34.0* 12/31/2013   MCV 82 12/31/2013   PLT 335 12/31/2013     Chemistry      Component Value Date/Time   NA 140 12/31/2013 1110   NA 140 12/03/2013 1007   NA 141 09/04/2013 1049   K 3.5 12/31/2013 1110   K 3.8 12/03/2013 1007   K 4.0 09/04/2013 1049   CL 101 12/31/2013 1110   CL 105 12/03/2013 1007   CO2 27 12/31/2013 1110   CO2 20 12/03/2013 1007   CO2 27 09/04/2013 1049   BUN 13 12/31/2013 1110   BUN 11 12/03/2013 1007   BUN  18.3 09/04/2013 1049   CREATININE 0.8 12/31/2013 1110   CREATININE 0.87 12/03/2013 1007   CREATININE 0.9 09/04/2013 1049      Component Value Date/Time   CALCIUM 9.4 12/31/2013 1110   CALCIUM 9.3 12/03/2013 1007   CALCIUM 9.8 09/04/2013 1049   ALKPHOS 94* 12/31/2013 1110   ALKPHOS 198* 12/03/2013 1007   ALKPHOS 45 09/04/2013 1049   AST 22 12/31/2013 1110   AST 12 12/03/2013 1007   AST 12 09/04/2013 1049   ALT 20 12/31/2013 1110   ALT 12 12/03/2013 1007   ALT 17 09/04/2013 1049   BILITOT 0.70 12/31/2013 1110   BILITOT 0.4 12/03/2013 1007   BILITOT 0.47 09/04/2013 1049         Impression and Plan: Emily Greer is 63 year old female. She has recurrent lambda light chain myeloma.  She now is on Pomalidomide with Velcade.  She's doing well with this. She is tolerating it well.  We have her off the Decadron. This is causing her a lot of issues with weight gain and swelling. I did go ahead and put her on some Lasix that she can take as needed for any kind of swelling.   We will continue her on weekly Velcade. She'll do the Pomalidomide 21 days on and 7 days off.  I'll plan to see her back myself in about 3 or 4 weeks. When we see her back, we will do the Aredia.  I spent about 30 minutes with her today  [name], MD 12/2/20155:34 PM

## 2014-01-02 ENCOUNTER — Telehealth: Payer: Self-pay | Admitting: *Deleted

## 2014-01-02 LAB — PROTEIN ELECTROPHORESIS, SERUM, WITH REFLEX
Albumin ELP: 63 % (ref 55.8–66.1)
Alpha-1-Globulin: 4.2 % (ref 2.9–4.9)
Alpha-2-Globulin: 10.8 % (ref 7.1–11.8)
Beta 2: 5.5 % (ref 3.2–6.5)
Beta Globulin: 7.7 % — ABNORMAL HIGH (ref 4.7–7.2)
Gamma Globulin: 8.8 % — ABNORMAL LOW (ref 11.1–18.8)
Total Protein, Serum Electrophoresis: 6.2 g/dL (ref 6.0–8.3)

## 2014-01-02 LAB — IGG, IGA, IGM
IgA: 78 mg/dL (ref 69–380)
IgG (Immunoglobin G), Serum: 476 mg/dL — ABNORMAL LOW (ref 690–1700)
IgM, Serum: 36 mg/dL — ABNORMAL LOW (ref 52–322)

## 2014-01-02 LAB — LACTATE DEHYDROGENASE: LDH: 164 U/L (ref 94–250)

## 2014-01-02 LAB — IFE INTERPRETATION

## 2014-01-02 LAB — KAPPA/LAMBDA LIGHT CHAINS
Kappa free light chain: 0.03 mg/dL — ABNORMAL LOW (ref 0.33–1.94)
Kappa:Lambda Ratio: 0.01 — ABNORMAL LOW (ref 0.26–1.65)
Lambda Free Lght Chn: 3.86 mg/dL — ABNORMAL HIGH (ref 0.57–2.63)

## 2014-01-02 NOTE — Telephone Encounter (Addendum)
Patient very happy with results  ----- Message from [name], MD sent at 01/01/2014  5:21 PM EST ----- Please call and let her know that the myeloma lambda light chain levels are down by 60%!!!!! this is fantastic. [name]

## 2014-01-05 ENCOUNTER — Other Ambulatory Visit: Payer: Self-pay

## 2014-01-05 ENCOUNTER — Telehealth: Payer: Self-pay

## 2014-01-05 DIAGNOSIS — C9000 Multiple myeloma not having achieved remission: Secondary | ICD-10-CM

## 2014-01-05 MED ORDER — POMALIDOMIDE 4 MG PO CAPS: 4.0000 mg | ORAL_CAPSULE | Freq: Every day | ORAL | Status: DC

## 2014-01-05 NOTE — Telephone Encounter (Signed)
Erroneous encounter

## 2014-01-06 ENCOUNTER — Other Ambulatory Visit: Payer: Self-pay | Admitting: Nurse Practitioner

## 2014-01-06 DIAGNOSIS — C9000 Multiple myeloma not having achieved remission: Secondary | ICD-10-CM

## 2014-01-06 MED ORDER — POMALIDOMIDE 4 MG PO CAPS: 4.0000 mg | ORAL_CAPSULE | Freq: Every day | ORAL | Status: DC

## 2014-01-07 ENCOUNTER — Telehealth: Payer: Self-pay | Admitting: Hematology & Oncology

## 2014-01-07 ENCOUNTER — Ambulatory Visit: Payer: BC Managed Care – PPO

## 2014-01-07 ENCOUNTER — Other Ambulatory Visit (HOSPITAL_BASED_OUTPATIENT_CLINIC_OR_DEPARTMENT_OTHER): Payer: BC Managed Care – PPO | Admitting: Lab

## 2014-01-07 ENCOUNTER — Other Ambulatory Visit: Payer: BC Managed Care – PPO | Admitting: Lab

## 2014-01-07 ENCOUNTER — Ambulatory Visit (HOSPITAL_BASED_OUTPATIENT_CLINIC_OR_DEPARTMENT_OTHER): Payer: BC Managed Care – PPO

## 2014-01-07 DIAGNOSIS — Z5112 Encounter for antineoplastic immunotherapy: Secondary | ICD-10-CM

## 2014-01-07 DIAGNOSIS — C9000 Multiple myeloma not having achieved remission: Secondary | ICD-10-CM

## 2014-01-07 LAB — CBC WITH DIFFERENTIAL (CANCER CENTER ONLY)
BASO#: 0.1 10*3/uL (ref 0.0–0.2)
BASO%: 3 % — ABNORMAL HIGH (ref 0.0–2.0)
EOS%: 3.3 % (ref 0.0–7.0)
Eosinophils Absolute: 0.1 10*3/uL (ref 0.0–0.5)
HCT: 32.3 % — ABNORMAL LOW (ref 34.8–46.6)
HGB: 10.6 g/dL — ABNORMAL LOW (ref 11.6–15.9)
LYMPH#: 1.1 10*3/uL (ref 0.9–3.3)
LYMPH%: 28.3 % (ref 14.0–48.0)
MCH: 26.2 pg (ref 26.0–34.0)
MCHC: 32.8 g/dL (ref 32.0–36.0)
MCV: 80 fL — ABNORMAL LOW (ref 81–101)
MONO#: 0.8 10*3/uL (ref 0.1–0.9)
MONO%: 20.8 % — ABNORMAL HIGH (ref 0.0–13.0)
NEUT#: 1.8 10*3/uL (ref 1.5–6.5)
NEUT%: 44.6 % (ref 39.6–80.0)
Platelets: 277 10*3/uL (ref 145–400)
RBC: 4.05 10*6/uL (ref 3.70–5.32)
RDW: 17 % — ABNORMAL HIGH (ref 11.1–15.7)
WBC: 4 10*3/uL (ref 3.9–10.0)

## 2014-01-07 LAB — CMP (CANCER CENTER ONLY)
ALT(SGPT): 23 U/L (ref 10–47)
AST: 20 U/L (ref 11–38)
Albumin: 3.6 g/dL (ref 3.3–5.5)
Alkaline Phosphatase: 76 U/L (ref 26–84)
BUN, Bld: 12 mg/dL (ref 7–22)
CO2: 28 mEq/L (ref 18–33)
Calcium: 9.1 mg/dL (ref 8.0–10.3)
Chloride: 99 mEq/L (ref 98–108)
Creat: 1 mg/dl (ref 0.6–1.2)
Glucose, Bld: 79 mg/dL (ref 73–118)
Potassium: 3.2 mEq/L — ABNORMAL LOW (ref 3.3–4.7)
Sodium: 139 mEq/L (ref 128–145)
Total Bilirubin: 0.6 mg/dl (ref 0.20–1.60)
Total Protein: 6.6 g/dL (ref 6.4–8.1)

## 2014-01-07 MED ORDER — ONDANSETRON HCL 8 MG PO TABS
8.0000 mg | ORAL_TABLET | Freq: Once | ORAL | Status: AC
  Administered 2014-01-07: 8 mg via ORAL

## 2014-01-07 MED ORDER — BORTEZOMIB CHEMO SQ INJECTION 3.5 MG (2.5MG/ML)
1.3000 mg/m2 | Freq: Once | INTRAMUSCULAR | Status: AC
  Administered 2014-01-07: 2.75 mg via SUBCUTANEOUS
  Filled 2014-01-07: qty 2.75

## 2014-01-07 MED ORDER — ONDANSETRON HCL 8 MG PO TABS
ORAL_TABLET | ORAL | Status: AC
  Filled 2014-01-07: qty 1

## 2014-01-07 NOTE — Patient Instructions (Signed)
Bortezomib injection What is this medicine? BORTEZOMIB (bor TEZ oh mib) is a chemotherapy drug. It slows the growth of cancer cells. This medicine is used to treat multiple myeloma, and certain lymphomas, such as mantle-cell lymphoma. This medicine may be used for other purposes; ask your health care provider or pharmacist if you have questions. COMMON BRAND NAME(S): Velcade What should I tell my health care provider before I take this medicine? They need to know if you have any of these conditions: -diabetes -heart disease -irregular heartbeat -liver disease -on hemodialysis -low blood counts, like low white blood cells, platelets, or hemoglobin -peripheral neuropathy -taking medicine for blood pressure -an unusual or allergic reaction to bortezomib, mannitol, boron, other medicines, foods, dyes, or preservatives -pregnant or trying to get pregnant -breast-feeding How should I use this medicine? This medicine is for injection into a vein or for injection under the skin. It is given by a health care professional in a hospital or clinic setting. Talk to your pediatrician regarding the use of this medicine in children. Special care may be needed. Overdosage: If you think you have taken too much of this medicine contact a poison control center or emergency room at once. NOTE: This medicine is only for you. Do not share this medicine with others. What if I miss a dose? It is important not to miss your dose. Call your doctor or health care professional if you are unable to keep an appointment. What may interact with this medicine? This medicine may interact with the following medications: -ketoconazole -rifampin -ritonavir -St. John's Wort This list may not describe all possible interactions. Give your health care provider a list of all the medicines, herbs, non-prescription drugs, or dietary supplements you use. Also tell them if you smoke, drink alcohol, or use illegal drugs. Some items  may interact with your medicine. What should I watch for while using this medicine? Visit your doctor for checks on your progress. This drug may make you feel generally unwell. This is not uncommon, as chemotherapy can affect healthy cells as well as cancer cells. Report any side effects. Continue your course of treatment even though you feel ill unless your doctor tells you to stop. You may get drowsy or dizzy. Do not drive, use machinery, or do anything that needs mental alertness until you know how this medicine affects you. Do not stand or sit up quickly, especially if you are an older patient. This reduces the risk of dizzy or fainting spells. In some cases, you may be given additional medicines to help with side effects. Follow all directions for their use. Call your doctor or health care professional for advice if you get a fever, chills or sore throat, or other symptoms of a cold or flu. Do not treat yourself. This drug decreases your body's ability to fight infections. Try to avoid being around people who are sick. This medicine may increase your risk to bruise or bleed. Call your doctor or health care professional if you notice any unusual bleeding. You may need blood work done while you are taking this medicine. In some patients, this medicine may cause a serious brain infection that may cause death. If you have any problems seeing, thinking, speaking, walking, or standing, tell your doctor right away. If you cannot reach your doctor, urgently seek other source of medical care. Do not become pregnant while taking this medicine. Women should inform their doctor if they wish to become pregnant or think they might be pregnant. There is   a potential for serious side effects to an unborn child. Talk to your health care professional or pharmacist for more information. Do not breast-feed an infant while taking this medicine. Check with your doctor or health care professional if you get an attack of  severe diarrhea, nausea and vomiting, or if you sweat a lot. The loss of too much body fluid can make it dangerous for you to take this medicine. What side effects may I notice from receiving this medicine? Side effects that you should report to your doctor or health care professional as soon as possible: -allergic reactions like skin rash, itching or hives, swelling of the face, lips, or tongue -breathing problems -changes in hearing -changes in vision -fast, irregular heartbeat -feeling faint or lightheaded, falls -pain, tingling, numbness in the hands or feet -right upper belly pain -seizures -swelling of the ankles, feet, hands -unusual bleeding or bruising -unusually weak or tired -vomiting -yellowing of the eyes or skin Side effects that usually do not require medical attention (report to your doctor or health care professional if they continue or are bothersome): -changes in emotions or moods -constipation -diarrhea -loss of appetite -headache -irritation at site where injected -nausea This list may not describe all possible side effects. Call your doctor for medical advice about side effects. You may report side effects to FDA at 1-800-FDA-1088. Where should I keep my medicine? This drug is given in a hospital or clinic and will not be stored at home. NOTE: This sheet is a summary. It may not cover all possible information. If you have questions about this medicine, talk to your doctor, pharmacist, or health care provider.  2015, Elsevier/Gold Standard. (2012-11-11 12:46:32)  

## 2014-01-07 NOTE — Telephone Encounter (Signed)
J7050 sodium chloride 0.9 % Soln NPR                            J2430 pamidronate 90 MG/10ML Soln 10 mL Vial NPR        J7040 sodium chloride 0.9 % Soln 500 mL Flex Cont NPR        J9041 bortezomib IV 3.5 MG Solr NPR        J9041 bortezomib IV 3.5 MG Solr NPR

## 2014-01-09 LAB — UIFE/LIGHT CHAINS/TP QN, 24-HR UR
Albumin, U: DETECTED
Alpha 1, Urine: DETECTED — AB
Alpha 2, Urine: DETECTED — AB
Beta, Urine: DETECTED — AB
Gamma Globulin, Urine: DETECTED — AB
Time: 24 hours
Total Protein, Urine-Ur/day: 95 mg/d (ref <150)
Total Protein, Urine: 5 mg/dL (ref 5–24)
Volume, Urine: 1900 mL

## 2014-01-12 LAB — 24 HR URINE,KAPPA/LAMBDA LIGHT CHAINS
24H Urine Volume: 1900 mL/24 h
Measured Kappa Chain: <0.4 mg/dL (ref <2.00)
Measured Lambda Chain: 0.57 mg/dL (ref <2.00)
Total Lambda Chain: 10.83 mg/24 h

## 2014-01-13 ENCOUNTER — Other Ambulatory Visit: Payer: Self-pay | Admitting: Nurse Practitioner

## 2014-01-14 ENCOUNTER — Ambulatory Visit (HOSPITAL_BASED_OUTPATIENT_CLINIC_OR_DEPARTMENT_OTHER): Payer: BC Managed Care – PPO

## 2014-01-14 ENCOUNTER — Other Ambulatory Visit (HOSPITAL_BASED_OUTPATIENT_CLINIC_OR_DEPARTMENT_OTHER): Payer: BC Managed Care – PPO | Admitting: Lab

## 2014-01-14 DIAGNOSIS — C9002 Multiple myeloma in relapse: Secondary | ICD-10-CM

## 2014-01-14 DIAGNOSIS — Z5112 Encounter for antineoplastic immunotherapy: Secondary | ICD-10-CM

## 2014-01-14 DIAGNOSIS — C9000 Multiple myeloma not having achieved remission: Secondary | ICD-10-CM

## 2014-01-14 LAB — CMP (CANCER CENTER ONLY)
ALT(SGPT): 17 U/L (ref 10–47)
AST: 23 U/L (ref 11–38)
Albumin: 3.6 g/dL (ref 3.3–5.5)
Alkaline Phosphatase: 75 U/L (ref 26–84)
BUN, Bld: 17 mg/dL (ref 7–22)
CO2: 27 mEq/L (ref 18–33)
Calcium: 9.3 mg/dL (ref 8.0–10.3)
Chloride: 102 mEq/L (ref 98–108)
Creat: 0.8 mg/dl (ref 0.6–1.2)
Glucose, Bld: 88 mg/dL (ref 73–118)
Potassium: 3.4 mEq/L (ref 3.3–4.7)
Sodium: 140 mEq/L (ref 128–145)
Total Bilirubin: 0.5 mg/dl (ref 0.20–1.60)
Total Protein: 6.5 g/dL (ref 6.4–8.1)

## 2014-01-14 LAB — CBC WITH DIFFERENTIAL (CANCER CENTER ONLY)
BASO#: 0.1 10*3/uL (ref 0.0–0.2)
BASO%: 3.3 % — ABNORMAL HIGH (ref 0.0–2.0)
EOS%: 6.2 % (ref 0.0–7.0)
Eosinophils Absolute: 0.2 10*3/uL (ref 0.0–0.5)
HCT: 31.9 % — ABNORMAL LOW (ref 34.8–46.6)
HGB: 10.4 g/dL — ABNORMAL LOW (ref 11.6–15.9)
LYMPH#: 0.9 10*3/uL (ref 0.9–3.3)
LYMPH%: 29.8 % (ref 14.0–48.0)
MCH: 26.1 pg (ref 26.0–34.0)
MCHC: 32.6 g/dL (ref 32.0–36.0)
MCV: 80 fL — ABNORMAL LOW (ref 81–101)
MONO#: 0.8 10*3/uL (ref 0.1–0.9)
MONO%: 27.5 % — ABNORMAL HIGH (ref 0.0–13.0)
NEUT#: 1 10*3/uL — ABNORMAL LOW (ref 1.5–6.5)
NEUT%: 33.2 % — ABNORMAL LOW (ref 39.6–80.0)
Platelets: 221 10*3/uL (ref 145–400)
RBC: 3.99 10*6/uL (ref 3.70–5.32)
RDW: 17.2 % — ABNORMAL HIGH (ref 11.1–15.7)
WBC: 3.1 10*3/uL — ABNORMAL LOW (ref 3.9–10.0)

## 2014-01-14 MED ORDER — ONDANSETRON HCL 8 MG PO TABS
8.0000 mg | ORAL_TABLET | Freq: Once | ORAL | Status: AC
  Administered 2014-01-14: 8 mg via ORAL

## 2014-01-14 MED ORDER — BORTEZOMIB CHEMO SQ INJECTION 3.5 MG (2.5MG/ML)
1.3000 mg/m2 | Freq: Once | INTRAMUSCULAR | Status: AC
  Administered 2014-01-14: 2.75 mg via SUBCUTANEOUS
  Filled 2014-01-14: qty 2.75

## 2014-01-14 MED ORDER — ONDANSETRON HCL 8 MG PO TABS
ORAL_TABLET | ORAL | Status: AC
  Filled 2014-01-14: qty 1

## 2014-01-14 NOTE — Patient Instructions (Signed)
Bortezomib injection What is this medicine? BORTEZOMIB (bor TEZ oh mib) is a chemotherapy drug. It slows the growth of cancer cells. This medicine is used to treat multiple myeloma, and certain lymphomas, such as mantle-cell lymphoma. This medicine may be used for other purposes; ask your health care provider or pharmacist if you have questions. COMMON BRAND NAME(S): Velcade What should I tell my health care provider before I take this medicine? They need to know if you have any of these conditions: -diabetes -heart disease -irregular heartbeat -liver disease -on hemodialysis -low blood counts, like low white blood cells, platelets, or hemoglobin -peripheral neuropathy -taking medicine for blood pressure -an unusual or allergic reaction to bortezomib, mannitol, boron, other medicines, foods, dyes, or preservatives -pregnant or trying to get pregnant -breast-feeding How should I use this medicine? This medicine is for injection into a vein or for injection under the skin. It is given by a health care professional in a hospital or clinic setting. Talk to your pediatrician regarding the use of this medicine in children. Special care may be needed. Overdosage: If you think you have taken too much of this medicine contact a poison control center or emergency room at once. NOTE: This medicine is only for you. Do not share this medicine with others. What if I miss a dose? It is important not to miss your dose. Call your doctor or health care professional if you are unable to keep an appointment. What may interact with this medicine? This medicine may interact with the following medications: -ketoconazole -rifampin -ritonavir -St. John's Wort This list may not describe all possible interactions. Give your health care provider a list of all the medicines, herbs, non-prescription drugs, or dietary supplements you use. Also tell them if you smoke, drink alcohol, or use illegal drugs. Some items  may interact with your medicine. What should I watch for while using this medicine? Visit your doctor for checks on your progress. This drug may make you feel generally unwell. This is not uncommon, as chemotherapy can affect healthy cells as well as cancer cells. Report any side effects. Continue your course of treatment even though you feel ill unless your doctor tells you to stop. You may get drowsy or dizzy. Do not drive, use machinery, or do anything that needs mental alertness until you know how this medicine affects you. Do not stand or sit up quickly, especially if you are an older patient. This reduces the risk of dizzy or fainting spells. In some cases, you may be given additional medicines to help with side effects. Follow all directions for their use. Call your doctor or health care professional for advice if you get a fever, chills or sore throat, or other symptoms of a cold or flu. Do not treat yourself. This drug decreases your body's ability to fight infections. Try to avoid being around people who are sick. This medicine may increase your risk to bruise or bleed. Call your doctor or health care professional if you notice any unusual bleeding. You may need blood work done while you are taking this medicine. In some patients, this medicine may cause a serious brain infection that may cause death. If you have any problems seeing, thinking, speaking, walking, or standing, tell your doctor right away. If you cannot reach your doctor, urgently seek other source of medical care. Do not become pregnant while taking this medicine. Women should inform their doctor if they wish to become pregnant or think they might be pregnant. There is   a potential for serious side effects to an unborn child. Talk to your health care professional or pharmacist for more information. Do not breast-feed an infant while taking this medicine. Check with your doctor or health care professional if you get an attack of  severe diarrhea, nausea and vomiting, or if you sweat a lot. The loss of too much body fluid can make it dangerous for you to take this medicine. What side effects may I notice from receiving this medicine? Side effects that you should report to your doctor or health care professional as soon as possible: -allergic reactions like skin rash, itching or hives, swelling of the face, lips, or tongue -breathing problems -changes in hearing -changes in vision -fast, irregular heartbeat -feeling faint or lightheaded, falls -pain, tingling, numbness in the hands or feet -right upper belly pain -seizures -swelling of the ankles, feet, hands -unusual bleeding or bruising -unusually weak or tired -vomiting -yellowing of the eyes or skin Side effects that usually do not require medical attention (report to your doctor or health care professional if they continue or are bothersome): -changes in emotions or moods -constipation -diarrhea -loss of appetite -headache -irritation at site where injected -nausea This list may not describe all possible side effects. Call your doctor for medical advice about side effects. You may report side effects to FDA at 1-800-FDA-1088. Where should I keep my medicine? This drug is given in a hospital or clinic and will not be stored at home. NOTE: This sheet is a summary. It may not cover all possible information. If you have questions about this medicine, talk to your doctor, pharmacist, or health care provider.  2015, Elsevier/Gold Standard. (2012-11-11 12:46:32)  

## 2014-01-29 ENCOUNTER — Ambulatory Visit (HOSPITAL_BASED_OUTPATIENT_CLINIC_OR_DEPARTMENT_OTHER): Payer: BC Managed Care – PPO

## 2014-01-29 ENCOUNTER — Ambulatory Visit (HOSPITAL_BASED_OUTPATIENT_CLINIC_OR_DEPARTMENT_OTHER): Payer: BC Managed Care – PPO | Admitting: Lab

## 2014-01-29 ENCOUNTER — Encounter: Payer: Self-pay | Admitting: Hematology & Oncology

## 2014-01-29 ENCOUNTER — Ambulatory Visit (HOSPITAL_BASED_OUTPATIENT_CLINIC_OR_DEPARTMENT_OTHER): Payer: BC Managed Care – PPO | Admitting: Hematology & Oncology

## 2014-01-29 VITALS — BP 112/57 | HR 82 | Temp 98.0°F | Resp 16 | Ht 63.0 in | Wt 202.0 lb

## 2014-01-29 DIAGNOSIS — C9000 Multiple myeloma not having achieved remission: Secondary | ICD-10-CM

## 2014-01-29 DIAGNOSIS — C9002 Multiple myeloma in relapse: Secondary | ICD-10-CM

## 2014-01-29 DIAGNOSIS — Z8579 Personal history of other malignant neoplasms of lymphoid, hematopoietic and related tissues: Secondary | ICD-10-CM

## 2014-01-29 DIAGNOSIS — Z5112 Encounter for antineoplastic immunotherapy: Secondary | ICD-10-CM

## 2014-01-29 DIAGNOSIS — R600 Localized edema: Secondary | ICD-10-CM

## 2014-01-29 LAB — CBC WITH DIFFERENTIAL (CANCER CENTER ONLY)
BASO#: 0.2 10*3/uL (ref 0.0–0.2)
BASO%: 4.5 % — ABNORMAL HIGH (ref 0.0–2.0)
EOS%: 5.8 % (ref 0.0–7.0)
Eosinophils Absolute: 0.2 10*3/uL (ref 0.0–0.5)
HCT: 33.1 % — ABNORMAL LOW (ref 34.8–46.6)
HGB: 10.9 g/dL — ABNORMAL LOW (ref 11.6–15.9)
LYMPH#: 0.9 10*3/uL (ref 0.9–3.3)
LYMPH%: 23.3 % (ref 14.0–48.0)
MCH: 26.1 pg (ref 26.0–34.0)
MCHC: 32.9 g/dL (ref 32.0–36.0)
MCV: 79 fL — ABNORMAL LOW (ref 81–101)
MONO#: 0.7 10*3/uL (ref 0.1–0.9)
MONO%: 18.6 % — ABNORMAL HIGH (ref 0.0–13.0)
NEUT#: 1.8 10*3/uL (ref 1.5–6.5)
NEUT%: 47.8 % (ref 39.6–80.0)
Platelets: 434 10*3/uL — ABNORMAL HIGH (ref 145–400)
RBC: 4.17 10*6/uL (ref 3.70–5.32)
RDW: 17.6 % — ABNORMAL HIGH (ref 11.1–15.7)
WBC: 3.8 10*3/uL — ABNORMAL LOW (ref 3.9–10.0)

## 2014-01-29 LAB — URINALYSIS, MICROSCOPIC (CHCC SATELLITE)
Bilirubin (Urine): NEGATIVE
Blood: NEGATIVE
Glucose: NEGATIVE mg/dL
Ketones: NEGATIVE mg/dL
Nitrite: NEGATIVE
Protein: NEGATIVE mg/dL
Specific Gravity, Urine: 1.015 (ref 1.003–1.035)
Urobilinogen, UR: 0.2 mg/dL (ref 0.2–1)
pH: 5 (ref 4.60–8.00)

## 2014-01-29 LAB — CMP (CANCER CENTER ONLY)
ALT(SGPT): 21 U/L (ref 10–47)
AST: 18 U/L (ref 11–38)
Albumin: 3.9 g/dL (ref 3.3–5.5)
Alkaline Phosphatase: 62 U/L (ref 26–84)
BUN, Bld: 16 mg/dL (ref 7–22)
CO2: 27 mEq/L (ref 18–33)
Calcium: 9.9 mg/dL (ref 8.0–10.3)
Chloride: 103 mEq/L (ref 98–108)
Creat: 1 mg/dl (ref 0.6–1.2)
Glucose, Bld: 92 mg/dL (ref 73–118)
Potassium: 3.3 mEq/L (ref 3.3–4.7)
Sodium: 143 mEq/L (ref 128–145)
Total Bilirubin: 0.5 mg/dl (ref 0.20–1.60)
Total Protein: 7 g/dL (ref 6.4–8.1)

## 2014-01-29 LAB — TECHNOLOGIST REVIEW CHCC SATELLITE

## 2014-01-29 MED ORDER — SODIUM CHLORIDE 0.9 % IV SOLN
90.0000 mg | Freq: Once | INTRAVENOUS | Status: AC
  Administered 2014-01-29: 90 mg via INTRAVENOUS
  Filled 2014-01-29: qty 10

## 2014-01-29 MED ORDER — BORTEZOMIB CHEMO SQ INJECTION 3.5 MG (2.5MG/ML)
1.3000 mg/m2 | Freq: Once | INTRAMUSCULAR | Status: AC
  Administered 2014-01-29: 2.75 mg via SUBCUTANEOUS
  Filled 2014-01-29: qty 2.75

## 2014-01-29 MED ORDER — ONDANSETRON HCL 8 MG PO TABS
ORAL_TABLET | ORAL | Status: AC
Start: 2014-01-29 — End: 2014-01-29
  Filled 2014-01-29: qty 1

## 2014-01-29 MED ORDER — ONDANSETRON HCL 8 MG PO TABS
8.0000 mg | ORAL_TABLET | Freq: Once | ORAL | Status: AC
Start: 2014-01-29 — End: 2014-01-29
  Administered 2014-01-29: 8 mg via ORAL

## 2014-01-29 NOTE — Progress Notes (Signed)
Hematology and Oncology Follow Up Visit  [name] [id number] 1950-08-04 63 y.o. 01/29/2014   Principle Diagnosis:   Recurrent lambda light chain myeloma  History of stage I endometrial carcinoma  Current Therapy:    Pomalidomide/Velcade  Aredia 60 mg IV Q. month     Interim History:  [name] is back for followup. She and her husband discovered back from [place]. They were at her mother's house. Her mother is I think in her late 25s or early 2s.  Emily Greer is doing well area and a 24-hour urine that was done in December showed that her urinary protein went from 900 mg per day down to 90 mg per day. Again, this has to be a reflection of the chemotherapy and the addition of Velcade.  She has had some slight urinary frequency. We did check a urinalysis on her. This would do not show any obvious infection.  Her grandson who was born recently out in [place] is having a slow recovery. He still is hospitalized. He weighs about 4 pounds now. been no mouth sores. She's had no neuropathy in her hands or feet.  We did go ahead and get a bone marrow biopsy on her. . The pathology report ([id number]) shows 21% plasma cells in the bone marrow. The cytogenetics were all normal.   I think that should that he is going to end up needing a second stem cell transplant. Her first transplant was over 10 years ago. She obviously got a lot of mileage out of that.   She's done well with the Pomalidomide.  I told her that we can check her blood and her urine, both, to see how well she's done.   Her last bone scan was done back in July. Her last bone survey showed stable lytic lesions. She was not having any problems with bony pain. She is most worried about her weight. I  Overall, her performance status is ECOG 1 Medications: Allergies:  Allergies  Allergen Reactions  . Codeine Nausea Only    Past Medical History, Surgical history, Social history, and Family History  were reviewed and updated.  Review of Systems: As above  Physical Exam:  height is $RemoveB'5\' 3"'[id number]$  (1.6 m) and weight is 202 lb (91.627 kg). Her oral temperature is 98 F (36.7 C). Her blood pressure is 112/57 and her pulse is 82. Her respiration is 16.   Well-developed and well-nourished [name]. Head and neck exam shows no ocular or oral lesion. There are no palpable cervical or supraclavicular lymph nodes. Lungs are clear. Cardiac exam regular rate and rhythm with no murmurs rubs or bruits. Abdomen is soft. She has good bowel sounds. There is no fluid wave. There is no palpable liver or spleen tip. Extremities shows no clubbing, cyanosis or edema. Neurological exam shows no focal neurological deficits. Skin exam no rashes, ecchymosis or petechia.  Lab Results  Component Value Date   WBC 3.8* 01/29/2014   HGB 10.9* 01/29/2014   HCT 33.1* 01/29/2014   MCV 79* 01/29/2014   PLT 434* 01/29/2014     Chemistry      Component Value Date/Time   NA 143 01/29/2014 0832   NA 140 12/03/2013 1007   NA 141 09/04/2013 1049   K 3.3 01/29/2014 0832   K 3.8 12/03/2013 1007   K 4.0 09/04/2013 1049   CL 103 01/29/2014 0832   CL 105 12/03/2013 1007   CO2 27 01/29/2014 0832   CO2 20 12/03/2013 1007  CO2 27 09/04/2013 1049   BUN 16 01/29/2014 0832   BUN 11 12/03/2013 1007   BUN 18.3 09/04/2013 1049   CREATININE 1.0 01/29/2014 0832   CREATININE 0.87 12/03/2013 1007   CREATININE 0.9 09/04/2013 1049      Component Value Date/Time   CALCIUM 9.9 01/29/2014 0832   CALCIUM 9.3 12/03/2013 1007   CALCIUM 9.8 09/04/2013 1049   ALKPHOS 62 01/29/2014 0832   ALKPHOS 198* 12/03/2013 1007   ALKPHOS 45 09/04/2013 1049   AST 18 01/29/2014 0832   AST 12 12/03/2013 1007   AST 12 09/04/2013 1049   ALT 21 01/29/2014 0832   ALT 12 12/03/2013 1007   ALT 17 09/04/2013 1049   BILITOT 0.50 01/29/2014 0832   BILITOT 0.4 12/03/2013 1007   BILITOT 0.47 09/04/2013 1049         Impression and  Plan: Emily Greer is 63 year old female. She has recurrent lambda light chain myeloma.  She now is on Pomalidomide with Velcade. She's doing well with this. She is tolerating it well. The last urine studies showed a marked decline in urinary protein.  I will have to speak with Dr. Alvie Heidelberg at [facility]. She was involved with Ms. Klose's initial stem cell transplant. I think that they probably have cells saved. I think it might be worthwhile thinking about a second transplant since Ms. Yanes is responding well.   We still have her off the Decadron. This was causing her a lot of issues with weight gain and swelling. I did go ahead and put her on some Lasix that she can take as needed for any kind of swelling.   We will continue her on weekly Velcade. She'll do the Pomalidomide 21 days on and 7 days off.  I'll plan to see her back myself in about 3 or 4 weeks. When we see her back, we will do the Aredia.  I spent about 30 minutes with her today  [name], MD 12/31/201512:27 PM

## 2014-01-29 NOTE — Patient Instructions (Signed)
Bortezomib injection What is this medicine? BORTEZOMIB (bor TEZ oh mib) is a chemotherapy drug. It slows the growth of cancer cells. This medicine is used to treat multiple myeloma, and certain lymphomas, such as mantle-cell lymphoma. This medicine may be used for other purposes; ask your health care provider or pharmacist if you have questions. COMMON BRAND NAME(S): Velcade What should I tell my health care provider before I take this medicine? They need to know if you have any of these conditions: -diabetes -heart disease -irregular heartbeat -liver disease -on hemodialysis -low blood counts, like low white blood cells, platelets, or hemoglobin -peripheral neuropathy -taking medicine for blood pressure -an unusual or allergic reaction to bortezomib, mannitol, boron, other medicines, foods, dyes, or preservatives -pregnant or trying to get pregnant -breast-feeding How should I use this medicine? This medicine is for injection into a vein or for injection under the skin. It is given by a health care professional in a hospital or clinic setting. Talk to your pediatrician regarding the use of this medicine in children. Special care may be needed. Overdosage: If you think you have taken too much of this medicine contact a poison control center or emergency room at once. NOTE: This medicine is only for you. Do not share this medicine with others. What if I miss a dose? It is important not to miss your dose. Call your doctor or health care professional if you are unable to keep an appointment. What may interact with this medicine? This medicine may interact with the following medications: -ketoconazole -rifampin -ritonavir -St. John's Wort This list may not describe all possible interactions. Give your health care provider a list of all the medicines, herbs, non-prescription drugs, or dietary supplements you use. Also tell them if you smoke, drink alcohol, or use illegal drugs. Some items  may interact with your medicine. What should I watch for while using this medicine? Visit your doctor for checks on your progress. This drug may make you feel generally unwell. This is not uncommon, as chemotherapy can affect healthy cells as well as cancer cells. Report any side effects. Continue your course of treatment even though you feel ill unless your doctor tells you to stop. You may get drowsy or dizzy. Do not drive, use machinery, or do anything that needs mental alertness until you know how this medicine affects you. Do not stand or sit up quickly, especially if you are an older patient. This reduces the risk of dizzy or fainting spells. In some cases, you may be given additional medicines to help with side effects. Follow all directions for their use. Call your doctor or health care professional for advice if you get a fever, chills or sore throat, or other symptoms of a cold or flu. Do not treat yourself. This drug decreases your body's ability to fight infections. Try to avoid being around people who are sick. This medicine may increase your risk to bruise or bleed. Call your doctor or health care professional if you notice any unusual bleeding. You may need blood work done while you are taking this medicine. In some patients, this medicine may cause a serious brain infection that may cause death. If you have any problems seeing, thinking, speaking, walking, or standing, tell your doctor right away. If you cannot reach your doctor, urgently seek other source of medical care. Do not become pregnant while taking this medicine. Women should inform their doctor if they wish to become pregnant or think they might be pregnant. There is   a potential for serious side effects to an unborn child. Talk to your health care professional or pharmacist for more information. Do not breast-feed an infant while taking this medicine. Check with your doctor or health care professional if you get an attack of  severe diarrhea, nausea and vomiting, or if you sweat a lot. The loss of too much body fluid can make it dangerous for you to take this medicine. What side effects may I notice from receiving this medicine? Side effects that you should report to your doctor or health care professional as soon as possible: -allergic reactions like skin rash, itching or hives, swelling of the face, lips, or tongue -breathing problems -changes in hearing -changes in vision -fast, irregular heartbeat -feeling faint or lightheaded, falls -pain, tingling, numbness in the hands or feet -right upper belly pain -seizures -swelling of the ankles, feet, hands -unusual bleeding or bruising -unusually weak or tired -vomiting -yellowing of the eyes or skin Side effects that usually do not require medical attention (report to your doctor or health care professional if they continue or are bothersome): -changes in emotions or moods -constipation -diarrhea -loss of appetite -headache -irritation at site where injected -nausea This list may not describe all possible side effects. Call your doctor for medical advice about side effects. You may report side effects to FDA at 1-800-FDA-1088. Where should I keep my medicine? This drug is given in a hospital or clinic and will not be stored at home. NOTE: This sheet is a summary. It may not cover all possible information. If you have questions about this medicine, talk to your doctor, pharmacist, or health care provider.  2015, [employer] Standard. (2012-11-11 12:46:32) Pamidronate injection What is this medicine? PAMIDRONATE (pa mi DROE nate) slows calcium loss from bones. It is used to treat high calcium blood levels from cancer or Paget's disease. It is also used to treat bone pain and prevent fractures from certain cancers that have spread to the bone. This medicine may be used for other purposes; ask your health care provider or pharmacist if you have  questions. COMMON BRAND NAME(S): Aredia What should I tell my health care provider before I take this medicine? They need to know if you have any of these conditions: -aspirin-sensitive asthma -dental disease -kidney disease -an unusual or allergic reaction to pamidronate, other medicines, foods, dyes, or preservatives -pregnant or trying to get pregnant -breast-feeding How should I use this medicine? This medicine is for infusion into a vein. It is given by a health care professional in a hospital or clinic setting. Talk to your pediatrician regarding the use of this medicine in children. This medicine is not approved for use in children. Overdosage: If you think you have taken too much of this medicine contact a poison control center or emergency room at once. NOTE: This medicine is only for you. Do not share this medicine with others. What if I miss a dose? This does not apply. What may interact with this medicine? -certain antibiotics given by injection -medicines for inflammation or pain like ibuprofen, naproxen -some diuretics like bumetanide, furosemide -cyclosporine -parathyroid hormone -tacrolimus -teriparatide -thalidomide This list may not describe all possible interactions. Give your health care provider a list of all the medicines, herbs, non-prescription drugs, or dietary supplements you use. Also tell them if you smoke, drink alcohol, or use illegal drugs. Some items may interact with your medicine. What should I watch for while using this medicine? Visit your doctor or health care professional  for regular checkups. It may be some time before you see the benefit from this medicine. Do not stop taking your medicine unless your doctor tells you to. Your doctor may order blood tests or other tests to see how you are doing. Women should inform their doctor if they wish to become pregnant or think they might be pregnant. There is a potential for serious side effects to an  unborn child. Talk to your health care professional or pharmacist for more information. You should make sure that you get enough calcium and vitamin D while you are taking this medicine. Discuss the foods you eat and the vitamins you take with your health care professional. Some people who take this medicine have severe bone, joint, and/or muscle pain. This medicine may also increase your risk for a broken thigh bone. Tell your doctor right away if you have pain in your upper leg or groin. Tell your doctor if you have any pain that does not go away or that gets worse. What side effects may I notice from receiving this medicine? Side effects that you should report to your doctor or health care professional as soon as possible: -allergic reactions like skin rash, itching or hives, swelling of the face, lips, or tongue -black or tarry stools -changes in vision -eye inflammation, pain -high blood pressure -jaw pain, especially burning or cramping -muscle weakness -numb, tingling pain -swelling of feet or hands -trouble passing urine or change in the amount of urine -unable to move easily Side effects that usually do not require medical attention (report to your doctor or health care professional if they continue or are bothersome): -bone, joint, or muscle pain -constipation -dizzy, drowsy -fever -headache -loss of appetite -nausea, vomiting -pain at site where injected This list may not describe all possible side effects. Call your doctor for medical advice about side effects. You may report side effects to FDA at 1-800-FDA-1088. Where should I keep my medicine? This drug is given in a hospital or clinic and will not be stored at home. NOTE: This sheet is a summary. It may not cover all possible information. If you have questions about this medicine, talk to your doctor, pharmacist, or health care provider.  2015, [employer] Standard. (2010-07-15 08:49:49)

## 2014-01-30 LAB — VITAMIN D 25 HYDROXY (VIT D DEFICIENCY, FRACTURES): Vit D, 25-Hydroxy: 36 ng/mL (ref 30–100)

## 2014-02-03 LAB — KAPPA/LAMBDA LIGHT CHAINS
Kappa free light chain: 0.83 mg/dL (ref 0.33–1.94)
Kappa:Lambda Ratio: 0.24 — ABNORMAL LOW (ref 0.26–1.65)
Lambda Free Lght Chn: 3.45 mg/dL — ABNORMAL HIGH (ref 0.57–2.63)

## 2014-02-03 LAB — PROTEIN ELECTROPHORESIS, SERUM, WITH REFLEX
Albumin ELP: 62.5 % (ref 55.8–66.1)
Alpha-1-Globulin: 4.3 % (ref 2.9–4.9)
Alpha-2-Globulin: 11.6 % (ref 7.1–11.8)
Beta 2: 4.2 % (ref 3.2–6.5)
Beta Globulin: 8 % — ABNORMAL HIGH (ref 4.7–7.2)
Gamma Globulin: 9.4 % — ABNORMAL LOW (ref 11.1–18.8)
Total Protein, Serum Electrophoresis: 6.8 g/dL (ref 6.0–8.3)

## 2014-02-03 LAB — IGG, IGA, IGM
IgA: 112 mg/dL (ref 69–380)
IgG (Immunoglobin G), Serum: 613 mg/dL — ABNORMAL LOW (ref 690–1700)
IgM, Serum: 32 mg/dL — ABNORMAL LOW (ref 52–322)

## 2014-02-03 LAB — IFE INTERPRETATION

## 2014-02-04 ENCOUNTER — Ambulatory Visit (HOSPITAL_BASED_OUTPATIENT_CLINIC_OR_DEPARTMENT_OTHER): Payer: BC Managed Care – PPO | Admitting: Lab

## 2014-02-04 ENCOUNTER — Ambulatory Visit (HOSPITAL_BASED_OUTPATIENT_CLINIC_OR_DEPARTMENT_OTHER): Payer: BC Managed Care – PPO

## 2014-02-04 DIAGNOSIS — C9000 Multiple myeloma not having achieved remission: Secondary | ICD-10-CM

## 2014-02-04 DIAGNOSIS — Z5112 Encounter for antineoplastic immunotherapy: Secondary | ICD-10-CM

## 2014-02-04 LAB — CBC WITH DIFFERENTIAL (CANCER CENTER ONLY)
BASO#: 0.1 10*3/uL (ref 0.0–0.2)
BASO%: 1.6 % (ref 0.0–2.0)
EOS%: 3.8 % (ref 0.0–7.0)
Eosinophils Absolute: 0.1 10*3/uL (ref 0.0–0.5)
HCT: 32.6 % — ABNORMAL LOW (ref 34.8–46.6)
HGB: 10.6 g/dL — ABNORMAL LOW (ref 11.6–15.9)
LYMPH#: 0.7 10*3/uL — ABNORMAL LOW (ref 0.9–3.3)
LYMPH%: 18.6 % (ref 14.0–48.0)
MCH: 25.8 pg — ABNORMAL LOW (ref 26.0–34.0)
MCHC: 32.5 g/dL (ref 32.0–36.0)
MCV: 79 fL — ABNORMAL LOW (ref 81–101)
MONO#: 0.4 10*3/uL (ref 0.1–0.9)
MONO%: 10.8 % (ref 0.0–13.0)
NEUT#: 2.4 10*3/uL (ref 1.5–6.5)
NEUT%: 65.2 % (ref 39.6–80.0)
Platelets: 300 10*3/uL (ref 145–400)
RBC: 4.11 10*6/uL (ref 3.70–5.32)
RDW: 17.1 % — ABNORMAL HIGH (ref 11.1–15.7)
WBC: 3.7 10*3/uL — ABNORMAL LOW (ref 3.9–10.0)

## 2014-02-04 LAB — CMP (CANCER CENTER ONLY)
ALT(SGPT): 19 U/L (ref 10–47)
AST: 16 U/L (ref 11–38)
Albumin: 3.5 g/dL (ref 3.3–5.5)
Alkaline Phosphatase: 64 U/L (ref 26–84)
BUN, Bld: 13 mg/dL (ref 7–22)
CO2: 28 mEq/L (ref 18–33)
Calcium: 9.7 mg/dL (ref 8.0–10.3)
Chloride: 103 mEq/L (ref 98–108)
Creat: 0.9 mg/dl (ref 0.6–1.2)
Glucose, Bld: 100 mg/dL (ref 73–118)
Potassium: 3.3 mEq/L (ref 3.3–4.7)
Sodium: 141 mEq/L (ref 128–145)
Total Bilirubin: 0.5 mg/dl (ref 0.20–1.60)
Total Protein: 6.7 g/dL (ref 6.4–8.1)

## 2014-02-04 LAB — TRANSFERRIN RECEPTOR, SOLUABLE: Transferrin Receptor, Soluble: 1.86 mg/L — ABNORMAL HIGH (ref 0.76–1.76)

## 2014-02-04 MED ORDER — ONDANSETRON HCL 8 MG PO TABS
ORAL_TABLET | ORAL | Status: AC
  Filled 2014-02-04: qty 1

## 2014-02-04 MED ORDER — ONDANSETRON HCL 8 MG PO TABS
8.0000 mg | ORAL_TABLET | Freq: Once | ORAL | Status: AC
  Administered 2014-02-04: 8 mg via ORAL

## 2014-02-04 MED ORDER — BORTEZOMIB CHEMO SQ INJECTION 3.5 MG (2.5MG/ML)
1.3000 mg/m2 | Freq: Once | INTRAMUSCULAR | Status: AC
  Administered 2014-02-04: 2.75 mg via SUBCUTANEOUS
  Filled 2014-02-04: qty 2.75

## 2014-02-04 NOTE — Patient Instructions (Signed)
Bortezomib injection What is this medicine? BORTEZOMIB (bor TEZ oh mib) is a chemotherapy drug. It slows the growth of cancer cells. This medicine is used to treat multiple myeloma, and certain lymphomas, such as mantle-cell lymphoma. This medicine may be used for other purposes; ask your health care provider or pharmacist if you have questions. COMMON BRAND NAME(S): Velcade What should I tell my health care provider before I take this medicine? They need to know if you have any of these conditions: -diabetes -heart disease -irregular heartbeat -liver disease -on hemodialysis -low blood counts, like low white blood cells, platelets, or hemoglobin -peripheral neuropathy -taking medicine for blood pressure -an unusual or allergic reaction to bortezomib, mannitol, boron, other medicines, foods, dyes, or preservatives -pregnant or trying to get pregnant -breast-feeding How should I use this medicine? This medicine is for injection into a vein or for injection under the skin. It is given by a health care professional in a hospital or clinic setting. Talk to your pediatrician regarding the use of this medicine in children. Special care may be needed. Overdosage: If you think you have taken too much of this medicine contact a poison control center or emergency room at once. NOTE: This medicine is only for you. Do not share this medicine with others. What if I miss a dose? It is important not to miss your dose. Call your doctor or health care professional if you are unable to keep an appointment. What may interact with this medicine? This medicine may interact with the following medications: -ketoconazole -rifampin -ritonavir -St. John's Wort This list may not describe all possible interactions. Give your health care provider a list of all the medicines, herbs, non-prescription drugs, or dietary supplements you use. Also tell them if you smoke, drink alcohol, or use illegal drugs. Some items  may interact with your medicine. What should I watch for while using this medicine? Visit your doctor for checks on your progress. This drug may make you feel generally unwell. This is not uncommon, as chemotherapy can affect healthy cells as well as cancer cells. Report any side effects. Continue your course of treatment even though you feel ill unless your doctor tells you to stop. You may get drowsy or dizzy. Do not drive, use machinery, or do anything that needs mental alertness until you know how this medicine affects you. Do not stand or sit up quickly, especially if you are an older patient. This reduces the risk of dizzy or fainting spells. In some cases, you may be given additional medicines to help with side effects. Follow all directions for their use. Call your doctor or health care professional for advice if you get a fever, chills or sore throat, or other symptoms of a cold or flu. Do not treat yourself. This drug decreases your body's ability to fight infections. Try to avoid being around people who are sick. This medicine may increase your risk to bruise or bleed. Call your doctor or health care professional if you notice any unusual bleeding. You may need blood work done while you are taking this medicine. In some patients, this medicine may cause a serious brain infection that may cause death. If you have any problems seeing, thinking, speaking, walking, or standing, tell your doctor right away. If you cannot reach your doctor, urgently seek other source of medical care. Do not become pregnant while taking this medicine. Women should inform their doctor if they wish to become pregnant or think they might be pregnant. There is   a potential for serious side effects to an unborn child. Talk to your health care professional or pharmacist for more information. Do not breast-feed an infant while taking this medicine. Check with your doctor or health care professional if you get an attack of  severe diarrhea, nausea and vomiting, or if you sweat a lot. The loss of too much body fluid can make it dangerous for you to take this medicine. What side effects may I notice from receiving this medicine? Side effects that you should report to your doctor or health care professional as soon as possible: -allergic reactions like skin rash, itching or hives, swelling of the face, lips, or tongue -breathing problems -changes in hearing -changes in vision -fast, irregular heartbeat -feeling faint or lightheaded, falls -pain, tingling, numbness in the hands or feet -right upper belly pain -seizures -swelling of the ankles, feet, hands -unusual bleeding or bruising -unusually weak or tired -vomiting -yellowing of the eyes or skin Side effects that usually do not require medical attention (report to your doctor or health care professional if they continue or are bothersome): -changes in emotions or moods -constipation -diarrhea -loss of appetite -headache -irritation at site where injected -nausea This list may not describe all possible side effects. Call your doctor for medical advice about side effects. You may report side effects to FDA at 1-800-FDA-1088. Where should I keep my medicine? This drug is given in a hospital or clinic and will not be stored at home. NOTE: This sheet is a summary. It may not cover all possible information. If you have questions about this medicine, talk to your doctor, pharmacist, or health care provider.  2015, Elsevier/Gold Standard. (2012-11-11 12:46:32)  

## 2014-02-05 ENCOUNTER — Encounter: Payer: Self-pay | Admitting: Radiation Oncology

## 2014-02-05 ENCOUNTER — Other Ambulatory Visit (HOSPITAL_COMMUNITY)
Admission: RE | Admit: 2014-02-05 | Discharge: 2014-02-05 | Disposition: A | Discharge status: Home / Self Care | Payer: BC Managed Care – PPO | Source: Ambulatory Visit | Attending: Radiation Oncology | Admitting: Radiation Oncology

## 2014-02-05 ENCOUNTER — Ambulatory Visit: Payer: BC Managed Care – PPO

## 2014-02-05 ENCOUNTER — Other Ambulatory Visit: Payer: Self-pay | Admitting: Nurse Practitioner

## 2014-02-05 ENCOUNTER — Ambulatory Visit
Admission: RE | Admit: 2014-02-05 | Discharge: 2014-02-05 | Disposition: A | Discharge status: Home / Self Care | Payer: BC Managed Care – PPO | Source: Ambulatory Visit | Attending: Radiation Oncology | Admitting: Radiation Oncology

## 2014-02-05 ENCOUNTER — Other Ambulatory Visit: Payer: BC Managed Care – PPO | Admitting: Lab

## 2014-02-05 VITALS — BP 136/68 | HR 88 | Temp 98.0°F | Resp 12 | Ht 63.0 in | Wt 202.7 lb

## 2014-02-05 DIAGNOSIS — C9000 Multiple myeloma not having achieved remission: Secondary | ICD-10-CM

## 2014-02-05 DIAGNOSIS — Z01411 Encounter for gynecological examination (general) (routine) with abnormal findings: Secondary | ICD-10-CM | POA: Insufficient documentation

## 2014-02-05 DIAGNOSIS — C541 Malignant neoplasm of endometrium: Secondary | ICD-10-CM

## 2014-02-05 MED ORDER — POMALIDOMIDE 4 MG PO CAPS: 4.0000 mg | ORAL_CAPSULE | Freq: Every day | ORAL | Status: DC

## 2014-02-05 NOTE — Progress Notes (Signed)
Emily Greer here for follow up after HDR treatment.  She denies pain, bowel and bladder issues, nausea and fatigue.  She is getting Velcade and taking pomalyst.  She reports a small amount of vaginal bleeding after intercourse.

## 2014-02-05 NOTE — Progress Notes (Signed)
Radiation Oncology         [phone] ________________________________  Name: Emily Greer MRN: [id number]  Date: 02/05/2014  DOB: [birth year]  Follow-Up Visit Note  CC: Laurena Slimmer, MD  Laurette Schimke, MD    ICD-9-CM ICD-10-CM   1. Endometrial carcinoma 182.0 C54.1 Cytology - PAP    Diagnosis:  Recurrent endometrial cancer  Interval Since Last Radiation:  18  months, the patient completed intracavitary brachytherapy treatments for recurrence  Narrative:  The patient returns today for routine follow-up.  The patient continues on therapy for her myeloma.  she seems to be tolerating this well. She has talked about potential transplant in the future and is being evaluated for this issue at [facility].  Patient has been inconsistent with using her vaginal dilator. I have stressed the importance of this. She does have some mild post cortical spotting which may be related to this issue.                              ALLERGIES:  is allergic to codeine.  Meds: Current Outpatient Prescriptions  Medication Sig Dispense Refill  . acyclovir (ZOVIRAX) 400 MG tablet Take 1 tablet (400 mg total) by mouth 2 (two) times daily. 60 tablet 3  . allopurinol (ZYLOPRIM) 100 MG tablet Take 100 mg by mouth daily.     [name] Kitchen aspirin 81 MG tablet Take 81 mg by mouth daily.    . Biotin 5000 MCG TABS Take by mouth every morning.    . calcium carbonate (OS-CAL) 600 MG TABS Take 600 mg by mouth daily with breakfast.     . Cholecalciferol (VITAMIN D3) 2000 UNITS TABS Take 1 tablet by mouth daily.    . furosemide (LASIX) 20 MG tablet TAKE 1 TABLET BY MOUTH EVERY DAY AS NEEDED FOR SWELLING OF ARMS/LEGS 90 tablet 4  . loperamide (IMODIUM) 2 MG capsule Take 2 mg by mouth as needed for diarrhea or loose stools. For diarrhea    . loratadine (CLARITIN) 10 MG tablet Take 10 mg by mouth every morning.     . metoprolol (TOPROL-XL) 50 MG 24 hr tablet Take 50 mg by mouth every morning.     . Olmesartan-Amlodipine-HCTZ  20-5-12.5 MG TABS Take 1 tablet by mouth every morning.     . pomalidomide (POMALYST) 4 MG capsule Take 1 capsule (4 mg total) by mouth daily. Take with water on days 1-21. Repeat every 28 days. Authorization Number: [id number] 21 capsule 0  . potassium chloride SA (K-DUR,KLOR-CON) 20 MEQ tablet TAKE 1 TABLET BY MOUTH DAILY 90 tablet 0  . LORazepam (ATIVAN) 0.5 MG tablet Take 1 tablet (0.5 mg total) by mouth every 6 (six) hours as needed (Nausea or vomiting). (Patient not taking: Reported on 02/05/2014) 30 tablet 0  . ondansetron (ZOFRAN) 8 MG tablet     . prochlorperazine (COMPAZINE) 10 MG tablet Take 1 tablet (10 mg total) by mouth every 6 (six) hours as needed (Nausea or vomiting). (Patient not taking: Reported on 02/05/2014) 30 tablet 1   No current facility-administered medications for this encounter.    Physical Findings: The patient is in no acute distress. Patient is alert and oriented.  height is 5\' 3"  (1.6 m) and weight is 202 lb 11.2 oz (91.944 kg). Her oral temperature is 98 F (36.7 C). Her blood pressure is 136/68 and her pulse is 88. Her respiration is 12. .  The lungs are clear. The heart has a  regular rhythm and rate. No palpable supraclavicular adenopathy. Abdomen is soft and nontender with normal bowel sounds. No inguinal adenopathy is appreciated. On pelvic examination the external genitalia are unremarkable. A speculum exam is performed. There are no mucosal lesions noted in the vaginal vault. The mucosa however bleeds easily with manipulation. A Pap smear was obtained of the vaginal vault, left lateral wall. On bimanual and rectovaginal examination there no pelvic masses appreciated.  Lab Findings: Lab Results  Component Value Date   WBC 3.7* 02/04/2014   HGB 10.6* 02/04/2014   HCT 32.6* 02/04/2014   MCV 79* 02/04/2014   PLT 300 02/04/2014    Radiographic Findings: No results found.  Impression:  No evidence of recurrence on clinical exam today, Pap smear pending  Plan:   Follow-up with gynecology in April, gynecologic oncology in July in radiation oncology in October. The patient will continue close follow-up with medical oncology concerning her multiple myeloma.  ____________________________________ Blair Promise, MD

## 2014-02-06 LAB — CYTOLOGY - PAP

## 2014-02-06 LAB — PROTEIN ELECTROPHORESIS, SERUM, WITH REFLEX
Albumin ELP: 60.8 % (ref 55.8–66.1)
Alpha-1-Globulin: 4.6 % (ref 2.9–4.9)
Alpha-2-Globulin: 11.7 % (ref 7.1–11.8)
Beta 2: 5.4 % (ref 3.2–6.5)
Beta Globulin: 7.6 % — ABNORMAL HIGH (ref 4.7–7.2)
Gamma Globulin: 9.9 % — ABNORMAL LOW (ref 11.1–18.8)
Total Protein, Serum Electrophoresis: 6.2 g/dL (ref 6.0–8.3)

## 2014-02-06 LAB — IFE INTERPRETATION

## 2014-02-06 LAB — IGG, IGA, IGM
IgA: 95 mg/dL (ref 69–380)
IgG (Immunoglobin G), Serum: 577 mg/dL — ABNORMAL LOW (ref 690–1700)
IgM, Serum: 35 mg/dL — ABNORMAL LOW (ref 52–322)

## 2014-02-06 LAB — LACTATE DEHYDROGENASE: LDH: 153 U/L (ref 94–250)

## 2014-02-06 LAB — KAPPA/LAMBDA LIGHT CHAINS
Kappa free light chain: 1.24 mg/dL (ref 0.33–1.94)
Kappa:Lambda Ratio: 0.3 (ref 0.26–1.65)
Lambda Free Lght Chn: 4.2 mg/dL — ABNORMAL HIGH (ref 0.57–2.63)

## 2014-02-06 LAB — BETA 2 MICROGLOBULIN, SERUM: Beta-2 Microglobulin: 2.17 mg/L (ref <=2.51)

## 2014-02-09 ENCOUNTER — Telehealth: Payer: Self-pay | Admitting: Oncology

## 2014-02-09 NOTE — Telephone Encounter (Signed)
Notified Emily Greer of the good results on her pap smear per Dr. Sondra Come.  Kiwanda verbalized understanding.

## 2014-02-10 ENCOUNTER — Other Ambulatory Visit: Payer: Self-pay | Admitting: *Deleted

## 2014-02-10 DIAGNOSIS — C9000 Multiple myeloma not having achieved remission: Secondary | ICD-10-CM

## 2014-02-11 ENCOUNTER — Other Ambulatory Visit (HOSPITAL_BASED_OUTPATIENT_CLINIC_OR_DEPARTMENT_OTHER): Payer: BC Managed Care – PPO | Admitting: Lab

## 2014-02-11 ENCOUNTER — Ambulatory Visit (HOSPITAL_BASED_OUTPATIENT_CLINIC_OR_DEPARTMENT_OTHER): Payer: BC Managed Care – PPO

## 2014-02-11 DIAGNOSIS — Z5112 Encounter for antineoplastic immunotherapy: Secondary | ICD-10-CM

## 2014-02-11 DIAGNOSIS — C9000 Multiple myeloma not having achieved remission: Secondary | ICD-10-CM

## 2014-02-11 LAB — CBC WITH DIFFERENTIAL (CANCER CENTER ONLY)
BASO#: 0.1 10*3/uL (ref 0.0–0.2)
BASO%: 2.5 % — ABNORMAL HIGH (ref 0.0–2.0)
EOS%: 7.4 % — ABNORMAL HIGH (ref 0.0–7.0)
Eosinophils Absolute: 0.3 10*3/uL (ref 0.0–0.5)
HCT: 31.6 % — ABNORMAL LOW (ref 34.8–46.6)
HGB: 10.2 g/dL — ABNORMAL LOW (ref 11.6–15.9)
LYMPH#: 1 10*3/uL (ref 0.9–3.3)
LYMPH%: 24.6 % (ref 14.0–48.0)
MCH: 25.6 pg — ABNORMAL LOW (ref 26.0–34.0)
MCHC: 32.3 g/dL (ref 32.0–36.0)
MCV: 79 fL — ABNORMAL LOW (ref 81–101)
MONO#: 0.8 10*3/uL (ref 0.1–0.9)
MONO%: 20.1 % — ABNORMAL HIGH (ref 0.0–13.0)
NEUT#: 1.8 10*3/uL (ref 1.5–6.5)
NEUT%: 45.4 % (ref 39.6–80.0)
Platelets: 238 10*3/uL (ref 145–400)
RBC: 3.99 10*6/uL (ref 3.70–5.32)
RDW: 17.4 % — ABNORMAL HIGH (ref 11.1–15.7)
WBC: 3.9 10*3/uL (ref 3.9–10.0)

## 2014-02-11 LAB — CMP (CANCER CENTER ONLY)
ALT(SGPT): 19 U/L (ref 10–47)
AST: 20 U/L (ref 11–38)
Albumin: 3.4 g/dL (ref 3.3–5.5)
Alkaline Phosphatase: 57 U/L (ref 26–84)
BUN, Bld: 17 mg/dL (ref 7–22)
CO2: 29 mEq/L (ref 18–33)
Calcium: 9.7 mg/dL (ref 8.0–10.3)
Chloride: 101 mEq/L (ref 98–108)
Creat: 1.3 mg/dl — ABNORMAL HIGH (ref 0.6–1.2)
Glucose, Bld: 86 mg/dL (ref 73–118)
Potassium: 3.5 mEq/L (ref 3.3–4.7)
Sodium: 139 mEq/L (ref 128–145)
Total Bilirubin: 0.5 mg/dl (ref 0.20–1.60)
Total Protein: 6.3 g/dL — ABNORMAL LOW (ref 6.4–8.1)

## 2014-02-11 MED ORDER — BORTEZOMIB CHEMO SQ INJECTION 3.5 MG (2.5MG/ML)
1.3000 mg/m2 | Freq: Once | INTRAMUSCULAR | Status: AC
  Administered 2014-02-11: 2.75 mg via SUBCUTANEOUS
  Filled 2014-02-11: qty 2.75

## 2014-02-11 MED ORDER — ONDANSETRON HCL 8 MG PO TABS
ORAL_TABLET | ORAL | Status: AC
  Filled 2014-02-11: qty 1

## 2014-02-11 MED ORDER — ONDANSETRON HCL 8 MG PO TABS
8.0000 mg | ORAL_TABLET | Freq: Once | ORAL | Status: AC
  Administered 2014-02-11: 8 mg via ORAL

## 2014-02-11 NOTE — Patient Instructions (Signed)
Bortezomib injection What is this medicine? BORTEZOMIB (bor TEZ oh mib) is a chemotherapy drug. It slows the growth of cancer cells. This medicine is used to treat multiple myeloma, and certain lymphomas, such as mantle-cell lymphoma. This medicine may be used for other purposes; ask your health care provider or pharmacist if you have questions. COMMON BRAND NAME(S): Velcade What should I tell my health care provider before I take this medicine? They need to know if you have any of these conditions: -diabetes -heart disease -irregular heartbeat -liver disease -on hemodialysis -low blood counts, like low white blood cells, platelets, or hemoglobin -peripheral neuropathy -taking medicine for blood pressure -an unusual or allergic reaction to bortezomib, mannitol, boron, other medicines, foods, dyes, or preservatives -pregnant or trying to get pregnant -breast-feeding How should I use this medicine? This medicine is for injection into a vein or for injection under the skin. It is given by a health care professional in a hospital or clinic setting. Talk to your pediatrician regarding the use of this medicine in children. Special care may be needed. Overdosage: If you think you have taken too much of this medicine contact a poison control center or emergency room at once. NOTE: This medicine is only for you. Do not share this medicine with others. What if I miss a dose? It is important not to miss your dose. Call your doctor or health care professional if you are unable to keep an appointment. What may interact with this medicine? This medicine may interact with the following medications: -ketoconazole -rifampin -ritonavir -St. John's Wort This list may not describe all possible interactions. Give your health care provider a list of all the medicines, herbs, non-prescription drugs, or dietary supplements you use. Also tell them if you smoke, drink alcohol, or use illegal drugs. Some items  may interact with your medicine. What should I watch for while using this medicine? Visit your doctor for checks on your progress. This drug may make you feel generally unwell. This is not uncommon, as chemotherapy can affect healthy cells as well as cancer cells. Report any side effects. Continue your course of treatment even though you feel ill unless your doctor tells you to stop. You may get drowsy or dizzy. Do not drive, use machinery, or do anything that needs mental alertness until you know how this medicine affects you. Do not stand or sit up quickly, especially if you are an older patient. This reduces the risk of dizzy or fainting spells. In some cases, you may be given additional medicines to help with side effects. Follow all directions for their use. Call your doctor or health care professional for advice if you get a fever, chills or sore throat, or other symptoms of a cold or flu. Do not treat yourself. This drug decreases your body's ability to fight infections. Try to avoid being around people who are sick. This medicine may increase your risk to bruise or bleed. Call your doctor or health care professional if you notice any unusual bleeding. You may need blood work done while you are taking this medicine. In some patients, this medicine may cause a serious brain infection that may cause death. If you have any problems seeing, thinking, speaking, walking, or standing, tell your doctor right away. If you cannot reach your doctor, urgently seek other source of medical care. Do not become pregnant while taking this medicine. Women should inform their doctor if they wish to become pregnant or think they might be pregnant. There is   a potential for serious side effects to an unborn child. Talk to your health care professional or pharmacist for more information. Do not breast-feed an infant while taking this medicine. Check with your doctor or health care professional if you get an attack of  severe diarrhea, nausea and vomiting, or if you sweat a lot. The loss of too much body fluid can make it dangerous for you to take this medicine. What side effects may I notice from receiving this medicine? Side effects that you should report to your doctor or health care professional as soon as possible: -allergic reactions like skin rash, itching or hives, swelling of the face, lips, or tongue -breathing problems -changes in hearing -changes in vision -fast, irregular heartbeat -feeling faint or lightheaded, falls -pain, tingling, numbness in the hands or feet -right upper belly pain -seizures -swelling of the ankles, feet, hands -unusual bleeding or bruising -unusually weak or tired -vomiting -yellowing of the eyes or skin Side effects that usually do not require medical attention (report to your doctor or health care professional if they continue or are bothersome): -changes in emotions or moods -constipation -diarrhea -loss of appetite -headache -irritation at site where injected -nausea This list may not describe all possible side effects. Call your doctor for medical advice about side effects. You may report side effects to FDA at 1-800-FDA-1088. Where should I keep my medicine? This drug is given in a hospital or clinic and will not be stored at home. NOTE: This sheet is a summary. It may not cover all possible information. If you have questions about this medicine, talk to your doctor, pharmacist, or health care provider.  2015, Elsevier/Gold Standard. (2012-11-11 12:46:32)  

## 2014-02-12 ENCOUNTER — Other Ambulatory Visit: Payer: BC Managed Care – PPO | Admitting: Lab

## 2014-02-12 ENCOUNTER — Ambulatory Visit: Payer: BC Managed Care – PPO

## 2014-02-24 ENCOUNTER — Other Ambulatory Visit: Payer: Self-pay | Admitting: *Deleted

## 2014-02-24 DIAGNOSIS — C9000 Multiple myeloma not having achieved remission: Secondary | ICD-10-CM

## 2014-02-25 ENCOUNTER — Ambulatory Visit (HOSPITAL_BASED_OUTPATIENT_CLINIC_OR_DEPARTMENT_OTHER): Payer: BC Managed Care – PPO | Admitting: Hematology & Oncology

## 2014-02-25 ENCOUNTER — Other Ambulatory Visit (HOSPITAL_BASED_OUTPATIENT_CLINIC_OR_DEPARTMENT_OTHER): Payer: BC Managed Care – PPO | Admitting: Lab

## 2014-02-25 ENCOUNTER — Ambulatory Visit (HOSPITAL_BASED_OUTPATIENT_CLINIC_OR_DEPARTMENT_OTHER): Payer: BC Managed Care – PPO

## 2014-02-25 VITALS — BP 125/67 | HR 90 | Temp 98.2°F | Resp 14 | Ht 63.0 in | Wt 200.0 lb

## 2014-02-25 DIAGNOSIS — C9002 Multiple myeloma in relapse: Secondary | ICD-10-CM

## 2014-02-25 DIAGNOSIS — Z8579 Personal history of other malignant neoplasms of lymphoid, hematopoietic and related tissues: Secondary | ICD-10-CM

## 2014-02-25 DIAGNOSIS — Z5112 Encounter for antineoplastic immunotherapy: Secondary | ICD-10-CM

## 2014-02-25 DIAGNOSIS — C9000 Multiple myeloma not having achieved remission: Secondary | ICD-10-CM

## 2014-02-25 DIAGNOSIS — Z8544 Personal history of malignant neoplasm of other female genital organs: Secondary | ICD-10-CM

## 2014-02-25 DIAGNOSIS — C541 Malignant neoplasm of endometrium: Secondary | ICD-10-CM

## 2014-02-25 LAB — CMP (CANCER CENTER ONLY)
ALT(SGPT): 16 U/L (ref 10–47)
AST: 19 U/L (ref 11–38)
Albumin: 3.7 g/dL (ref 3.3–5.5)
Alkaline Phosphatase: 54 U/L (ref 26–84)
BUN, Bld: 16 mg/dL (ref 7–22)
CO2: 25 mEq/L (ref 18–33)
Calcium: 9.2 mg/dL (ref 8.0–10.3)
Chloride: 101 mEq/L (ref 98–108)
Creat: 0.8 mg/dl (ref 0.6–1.2)
Glucose, Bld: 86 mg/dL (ref 73–118)
Potassium: 3.8 mEq/L (ref 3.3–4.7)
Sodium: 139 mEq/L (ref 128–145)
Total Bilirubin: 0.5 mg/dl (ref 0.20–1.60)
Total Protein: 6.7 g/dL (ref 6.4–8.1)

## 2014-02-25 LAB — CBC WITH DIFFERENTIAL (CANCER CENTER ONLY)
BASO#: 0.2 10*3/uL (ref 0.0–0.2)
BASO%: 3.6 % — ABNORMAL HIGH (ref 0.0–2.0)
EOS%: 1.6 % (ref 0.0–7.0)
Eosinophils Absolute: 0.1 10*3/uL (ref 0.0–0.5)
HCT: 33 % — ABNORMAL LOW (ref 34.8–46.6)
HGB: 10.7 g/dL — ABNORMAL LOW (ref 11.6–15.9)
LYMPH#: 1.2 10*3/uL (ref 0.9–3.3)
LYMPH%: 26.3 % (ref 14.0–48.0)
MCH: 25.8 pg — ABNORMAL LOW (ref 26.0–34.0)
MCHC: 32.4 g/dL (ref 32.0–36.0)
MCV: 80 fL — ABNORMAL LOW (ref 81–101)
MONO#: 0.8 10*3/uL (ref 0.1–0.9)
MONO%: 18.4 % — ABNORMAL HIGH (ref 0.0–13.0)
NEUT#: 2.2 10*3/uL (ref 1.5–6.5)
NEUT%: 50.1 % (ref 39.6–80.0)
Platelets: 393 10*3/uL (ref 145–400)
RBC: 4.14 10*6/uL (ref 3.70–5.32)
RDW: 18.4 % — ABNORMAL HIGH (ref 11.1–15.7)
WBC: 4.4 10*3/uL (ref 3.9–10.0)

## 2014-02-25 MED ORDER — ONDANSETRON HCL 8 MG PO TABS
ORAL_TABLET | ORAL | Status: AC
  Filled 2014-02-25: qty 1

## 2014-02-25 MED ORDER — SODIUM CHLORIDE 0.9 % IV SOLN
90.0000 mg | Freq: Once | INTRAVENOUS | Status: AC
  Administered 2014-02-25: 90 mg via INTRAVENOUS
  Filled 2014-02-25: qty 10

## 2014-02-25 MED ORDER — ONDANSETRON HCL 8 MG PO TABS
8.0000 mg | ORAL_TABLET | Freq: Once | ORAL | Status: AC
  Administered 2014-02-25: 8 mg via ORAL

## 2014-02-25 MED ORDER — BORTEZOMIB CHEMO SQ INJECTION 3.5 MG (2.5MG/ML)
1.3000 mg/m2 | Freq: Once | INTRAMUSCULAR | Status: AC
  Administered 2014-02-25: 2.75 mg via SUBCUTANEOUS
  Filled 2014-02-25: qty 2.75

## 2014-02-25 NOTE — Patient Instructions (Signed)
Bortezomib injection What is this medicine? BORTEZOMIB (bor TEZ oh mib) is a chemotherapy drug. It slows the growth of cancer cells. This medicine is used to treat multiple myeloma, and certain lymphomas, such as mantle-cell lymphoma. This medicine may be used for other purposes; ask your health care provider or pharmacist if you have questions. COMMON BRAND NAME(S): Velcade What should I tell my health care provider before I take this medicine? They need to know if you have any of these conditions: -diabetes -heart disease -irregular heartbeat -liver disease -on hemodialysis -low blood counts, like low white blood cells, platelets, or hemoglobin -peripheral neuropathy -taking medicine for blood pressure -an unusual or allergic reaction to bortezomib, mannitol, boron, other medicines, foods, dyes, or preservatives -pregnant or trying to get pregnant -breast-feeding How should I use this medicine? This medicine is for injection into a vein or for injection under the skin. It is given by a health care professional in a hospital or clinic setting. Talk to your pediatrician regarding the use of this medicine in children. Special care may be needed. Overdosage: If you think you have taken too much of this medicine contact a poison control center or emergency room at once. NOTE: This medicine is only for you. Do not share this medicine with others. What if I miss a dose? It is important not to miss your dose. Call your doctor or health care professional if you are unable to keep an appointment. What may interact with this medicine? This medicine may interact with the following medications: -ketoconazole -rifampin -ritonavir -St. John's Wort This list may not describe all possible interactions. Give your health care provider a list of all the medicines, herbs, non-prescription drugs, or dietary supplements you use. Also tell them if you smoke, drink alcohol, or use illegal drugs. Some items  may interact with your medicine. What should I watch for while using this medicine? Visit your doctor for checks on your progress. This drug may make you feel generally unwell. This is not uncommon, as chemotherapy can affect healthy cells as well as cancer cells. Report any side effects. Continue your course of treatment even though you feel ill unless your doctor tells you to stop. You may get drowsy or dizzy. Do not drive, use machinery, or do anything that needs mental alertness until you know how this medicine affects you. Do not stand or sit up quickly, especially if you are an older patient. This reduces the risk of dizzy or fainting spells. In some cases, you may be given additional medicines to help with side effects. Follow all directions for their use. Call your doctor or health care professional for advice if you get a fever, chills or sore throat, or other symptoms of a cold or flu. Do not treat yourself. This drug decreases your body's ability to fight infections. Try to avoid being around people who are sick. This medicine may increase your risk to bruise or bleed. Call your doctor or health care professional if you notice any unusual bleeding. You may need blood work done while you are taking this medicine. In some patients, this medicine may cause a serious brain infection that may cause death. If you have any problems seeing, thinking, speaking, walking, or standing, tell your doctor right away. If you cannot reach your doctor, urgently seek other source of medical care. Do not become pregnant while taking this medicine. Women should inform their doctor if they wish to become pregnant or think they might be pregnant. There is   a potential for serious side effects to an unborn child. Talk to your health care professional or pharmacist for more information. Do not breast-feed an infant while taking this medicine. Check with your doctor or health care professional if you get an attack of  severe diarrhea, nausea and vomiting, or if you sweat a lot. The loss of too much body fluid can make it dangerous for you to take this medicine. What side effects may I notice from receiving this medicine? Side effects that you should report to your doctor or health care professional as soon as possible: -allergic reactions like skin rash, itching or hives, swelling of the face, lips, or tongue -breathing problems -changes in hearing -changes in vision -fast, irregular heartbeat -feeling faint or lightheaded, falls -pain, tingling, numbness in the hands or feet -right upper belly pain -seizures -swelling of the ankles, feet, hands -unusual bleeding or bruising -unusually weak or tired -vomiting -yellowing of the eyes or skin Side effects that usually do not require medical attention (report to your doctor or health care professional if they continue or are bothersome): -changes in emotions or moods -constipation -diarrhea -loss of appetite -headache -irritation at site where injected -nausea This list may not describe all possible side effects. Call your doctor for medical advice about side effects. You may report side effects to FDA at 1-800-FDA-1088. Where should I keep my medicine? This drug is given in a hospital or clinic and will not be stored at home. NOTE: This sheet is a summary. It may not cover all possible information. If you have questions about this medicine, talk to your doctor, pharmacist, or health care provider.  2015, [employer] Standard. (2012-11-11 12:46:32) Pamidronate injection What is this medicine? PAMIDRONATE (pa mi DROE nate) slows calcium loss from bones. It is used to treat high calcium blood levels from cancer or Paget's disease. It is also used to treat bone pain and prevent fractures from certain cancers that have spread to the bone. This medicine may be used for other purposes; ask your health care provider or pharmacist if you have  questions. COMMON BRAND NAME(S): Aredia What should I tell my health care provider before I take this medicine? They need to know if you have any of these conditions: -aspirin-sensitive asthma -dental disease -kidney disease -an unusual or allergic reaction to pamidronate, other medicines, foods, dyes, or preservatives -pregnant or trying to get pregnant -breast-feeding How should I use this medicine? This medicine is for infusion into a vein. It is given by a health care professional in a hospital or clinic setting. Talk to your pediatrician regarding the use of this medicine in children. This medicine is not approved for use in children. Overdosage: If you think you have taken too much of this medicine contact a poison control center or emergency room at once. NOTE: This medicine is only for you. Do not share this medicine with others. What if I miss a dose? This does not apply. What may interact with this medicine? -certain antibiotics given by injection -medicines for inflammation or pain like ibuprofen, naproxen -some diuretics like bumetanide, furosemide -cyclosporine -parathyroid hormone -tacrolimus -teriparatide -thalidomide This list may not describe all possible interactions. Give your health care provider a list of all the medicines, herbs, non-prescription drugs, or dietary supplements you use. Also tell them if you smoke, drink alcohol, or use illegal drugs. Some items may interact with your medicine. What should I watch for while using this medicine? Visit your doctor or health care professional  for regular checkups. It may be some time before you see the benefit from this medicine. Do not stop taking your medicine unless your doctor tells you to. Your doctor may order blood tests or other tests to see how you are doing. Women should inform their doctor if they wish to become pregnant or think they might be pregnant. There is a potential for serious side effects to an  unborn child. Talk to your health care professional or pharmacist for more information. You should make sure that you get enough calcium and vitamin D while you are taking this medicine. Discuss the foods you eat and the vitamins you take with your health care professional. Some people who take this medicine have severe bone, joint, and/or muscle pain. This medicine may also increase your risk for a broken thigh bone. Tell your doctor right away if you have pain in your upper leg or groin. Tell your doctor if you have any pain that does not go away or that gets worse. What side effects may I notice from receiving this medicine? Side effects that you should report to your doctor or health care professional as soon as possible: -allergic reactions like skin rash, itching or hives, swelling of the face, lips, or tongue -black or tarry stools -changes in vision -eye inflammation, pain -high blood pressure -jaw pain, especially burning or cramping -muscle weakness -numb, tingling pain -swelling of feet or hands -trouble passing urine or change in the amount of urine -unable to move easily Side effects that usually do not require medical attention (report to your doctor or health care professional if they continue or are bothersome): -bone, joint, or muscle pain -constipation -dizzy, drowsy -fever -headache -loss of appetite -nausea, vomiting -pain at site where injected This list may not describe all possible side effects. Call your doctor for medical advice about side effects. You may report side effects to FDA at 1-800-FDA-1088. Where should I keep my medicine? This drug is given in a hospital or clinic and will not be stored at home. NOTE: This sheet is a summary. It may not cover all possible information. If you have questions about this medicine, talk to your doctor, pharmacist, or health care provider.  2015, [employer] Standard. (2010-07-15 08:49:49)

## 2014-02-25 NOTE — Progress Notes (Signed)
Hematology and Oncology Follow Up Visit  Emily Greer [id number] [birth year] 64 y.o. 02/25/2014   Principle Diagnosis:   Recurrent lambda light chain myeloma  History of stage I endometrial carcinoma  Current Therapy:    Pomalidomide/Velcade  Aredia 60 mg IV Q. month     Interim History:  [name] is back for followup. She is doing quite well.  She was seen at [facility] last week. She saw Dr. Alvie Heidelberg. She was Emily Greer's transplant doctor back about 15 years ago. She feels that this Caso would be a good candidate for transplant. Hopefully, they still have stem cells collected that were saved back with her first transplant.  Overall, Emily Greer has done well. She is very happy that she is off Decadron. She's had no proximal with the Pomalidomide and Velcade. She has had no issues with neuropathy.  There's been no problems with nausea vomiting. She has had some fatigue.  There has been no diarrhea. She's had no rashes. Patient is on aspirin.   Overall, her performance status is ECOG 1 Medications: Allergies:  Allergies  Allergen Reactions  . Codeine Nausea Only    Past Medical History, Surgical history, Social history, and Family History were reviewed and updated.  Review of Systems: As above  Physical Exam:  height is 5\' 3"  (1.6 m) and weight is 200 lb (90.719 kg). Her oral temperature is 98.2 F (36.8 C). Her blood pressure is 125/67 and her pulse is 90. Her respiration is 14.   Well-developed and well-nourished [name]. Head and neck exam shows no ocular or oral lesion. There are no palpable cervical or supraclavicular lymph nodes. Lungs are clear. Cardiac exam regular rate and rhythm with no murmurs rubs or bruits. Abdomen is soft. She has good bowel sounds. There is no fluid wave. There is no palpable liver or spleen tip. Extremities shows no clubbing, cyanosis or edema. Neurological exam shows no focal neurological deficits.  Skin exam no rashes, ecchymosis or petechia.  Lab Results  Component Value Date   WBC 4.4 02/25/2014   HGB 10.7* 02/25/2014   HCT 33.0* 02/25/2014   MCV 80* 02/25/2014   PLT 393 02/25/2014     Chemistry      Component Value Date/Time   NA 139 02/25/2014 0920   NA 140 12/03/2013 1007   NA 141 09/04/2013 1049   K 3.8 02/25/2014 0920   K 3.8 12/03/2013 1007   K 4.0 09/04/2013 1049   CL 101 02/25/2014 0920   CL 105 12/03/2013 1007   CO2 25 02/25/2014 0920   CO2 20 12/03/2013 1007   CO2 27 09/04/2013 1049   BUN 16 02/25/2014 0920   BUN 11 12/03/2013 1007   BUN 18.3 09/04/2013 1049   CREATININE 0.8 02/25/2014 0920   CREATININE 0.87 12/03/2013 1007   CREATININE 0.9 09/04/2013 1049      Component Value Date/Time   CALCIUM 9.2 02/25/2014 0920   CALCIUM 9.3 12/03/2013 1007   CALCIUM 9.8 09/04/2013 1049   ALKPHOS 54 02/25/2014 0920   ALKPHOS 198* 12/03/2013 1007   ALKPHOS 45 09/04/2013 1049   AST 19 02/25/2014 0920   AST 12 12/03/2013 1007   AST 12 09/04/2013 1049   ALT 16 02/25/2014 0920   ALT 12 12/03/2013 1007   ALT 17 09/04/2013 1049   BILITOT 0.50 02/25/2014 0920   BILITOT 0.4 12/03/2013 1007   BILITOT 0.47 09/04/2013 1049         Impression and Plan: [name] is 64 year old female. She has recurrent lambda light chain myeloma.  She now is on Pomalidomide with Velcade. She's doing well with this. She is tolerating it well. The last urine studies showed a marked decline in urinary protein. We will recheck some urine studies moist see her back.  Again, hopefully there'll are already some stem cells that are collected. If so, I would think that getting this Tracy to transplant should not be much of a problem.  Emily Greer like to try to put off a transplantable after she goes to [place] to see her new grandson. I would like to think that this could be accomplished.  We will continue her on weekly Velcade. She'll do the Pomalidomide 21 days on and 7  days off.  I'll plan to see her back myself in about 3 or 4 weeks. When we see her back, we will do the Aredia.  I would like to get a bone survey on her so that we can make sure that everything is doing okay with her bones.  I spent about 30 minutes with her today  [name], MD 1/27/201610:10 AM

## 2014-02-26 ENCOUNTER — Ambulatory Visit: Payer: BC Managed Care – PPO | Admitting: Family

## 2014-02-26 ENCOUNTER — Other Ambulatory Visit: Payer: BC Managed Care – PPO | Admitting: Lab

## 2014-02-26 ENCOUNTER — Ambulatory Visit: Payer: BC Managed Care – PPO

## 2014-03-04 ENCOUNTER — Other Ambulatory Visit: Payer: BC Managed Care – PPO | Admitting: Lab

## 2014-03-04 ENCOUNTER — Ambulatory Visit: Payer: BC Managed Care – PPO

## 2014-03-05 ENCOUNTER — Other Ambulatory Visit: Payer: Self-pay | Admitting: Nurse Practitioner

## 2014-03-05 ENCOUNTER — Other Ambulatory Visit (HOSPITAL_BASED_OUTPATIENT_CLINIC_OR_DEPARTMENT_OTHER): Payer: BC Managed Care – PPO | Admitting: Lab

## 2014-03-05 ENCOUNTER — Ambulatory Visit (HOSPITAL_BASED_OUTPATIENT_CLINIC_OR_DEPARTMENT_OTHER): Payer: BC Managed Care – PPO

## 2014-03-05 DIAGNOSIS — C9000 Multiple myeloma not having achieved remission: Secondary | ICD-10-CM

## 2014-03-05 DIAGNOSIS — Z5112 Encounter for antineoplastic immunotherapy: Secondary | ICD-10-CM

## 2014-03-05 LAB — CBC WITH DIFFERENTIAL (CANCER CENTER ONLY)
BASO#: 0.1 10*3/uL (ref 0.0–0.2)
BASO%: 1.4 % (ref 0.0–2.0)
EOS%: 4.6 % (ref 0.0–7.0)
Eosinophils Absolute: 0.2 10*3/uL (ref 0.0–0.5)
HCT: 31.3 % — ABNORMAL LOW (ref 34.8–46.6)
HGB: 10.2 g/dL — ABNORMAL LOW (ref 11.6–15.9)
LYMPH#: 0.8 10*3/uL — ABNORMAL LOW (ref 0.9–3.3)
LYMPH%: 22.6 % (ref 14.0–48.0)
MCH: 25.7 pg — ABNORMAL LOW (ref 26.0–34.0)
MCHC: 32.6 g/dL (ref 32.0–36.0)
MCV: 79 fL — ABNORMAL LOW (ref 81–101)
MONO#: 0.4 10*3/uL (ref 0.1–0.9)
MONO%: 11.5 % (ref 0.0–13.0)
NEUT#: 2.1 10*3/uL (ref 1.5–6.5)
NEUT%: 59.9 % (ref 39.6–80.0)
Platelets: 245 10*3/uL (ref 145–400)
RBC: 3.97 10*6/uL (ref 3.70–5.32)
RDW: 18 % — ABNORMAL HIGH (ref 11.1–15.7)
WBC: 3.5 10*3/uL — ABNORMAL LOW (ref 3.9–10.0)

## 2014-03-05 LAB — CMP (CANCER CENTER ONLY)
ALT(SGPT): 12 U/L (ref 10–47)
AST: 17 U/L (ref 11–38)
Albumin: 3.7 g/dL (ref 3.3–5.5)
Alkaline Phosphatase: 51 U/L (ref 26–84)
BUN, Bld: 16 mg/dL (ref 7–22)
CO2: 26 mEq/L (ref 18–33)
Calcium: 9.6 mg/dL (ref 8.0–10.3)
Chloride: 100 mEq/L (ref 98–108)
Creat: 1 mg/dl (ref 0.6–1.2)
Glucose, Bld: 85 mg/dL (ref 73–118)
Potassium: 3 mEq/L — CL (ref 3.3–4.7)
Sodium: 137 mEq/L (ref 128–145)
Total Bilirubin: 0.5 mg/dl (ref 0.20–1.60)
Total Protein: 6.4 g/dL (ref 6.4–8.1)

## 2014-03-05 MED ORDER — ONDANSETRON HCL 8 MG PO TABS
ORAL_TABLET | ORAL | Status: AC
  Filled 2014-03-05: qty 1

## 2014-03-05 MED ORDER — BORTEZOMIB CHEMO SQ INJECTION 3.5 MG (2.5MG/ML)
1.3000 mg/m2 | Freq: Once | INTRAMUSCULAR | Status: AC
  Administered 2014-03-05: 2.75 mg via SUBCUTANEOUS
  Filled 2014-03-05: qty 2.75

## 2014-03-05 MED ORDER — ONDANSETRON HCL 8 MG PO TABS
8.0000 mg | ORAL_TABLET | Freq: Once | ORAL | Status: AC
  Administered 2014-03-05: 8 mg via ORAL

## 2014-03-05 NOTE — Patient Instructions (Signed)
Bortezomib injection What is this medicine? BORTEZOMIB (bor TEZ oh mib) is a chemotherapy drug. It slows the growth of cancer cells. This medicine is used to treat multiple myeloma, and certain lymphomas, such as mantle-cell lymphoma. This medicine may be used for other purposes; ask your health care provider or pharmacist if you have questions. COMMON BRAND NAME(S): Velcade What should I tell my health care provider before I take this medicine? They need to know if you have any of these conditions: -diabetes -heart disease -irregular heartbeat -liver disease -on hemodialysis -low blood counts, like low white blood cells, platelets, or hemoglobin -peripheral neuropathy -taking medicine for blood pressure -an unusual or allergic reaction to bortezomib, mannitol, boron, other medicines, foods, dyes, or preservatives -pregnant or trying to get pregnant -breast-feeding How should I use this medicine? This medicine is for injection into a vein or for injection under the skin. It is given by a health care professional in a hospital or clinic setting. Talk to your pediatrician regarding the use of this medicine in children. Special care may be needed. Overdosage: If you think you have taken too much of this medicine contact a poison control center or emergency room at once. NOTE: This medicine is only for you. Do not share this medicine with others. What if I miss a dose? It is important not to miss your dose. Call your doctor or health care professional if you are unable to keep an appointment. What may interact with this medicine? This medicine may interact with the following medications: -ketoconazole -rifampin -ritonavir -St. John's Wort This list may not describe all possible interactions. Give your health care provider a list of all the medicines, herbs, non-prescription drugs, or dietary supplements you use. Also tell them if you smoke, drink alcohol, or use illegal drugs. Some items  may interact with your medicine. What should I watch for while using this medicine? Visit your doctor for checks on your progress. This drug may make you feel generally unwell. This is not uncommon, as chemotherapy can affect healthy cells as well as cancer cells. Report any side effects. Continue your course of treatment even though you feel ill unless your doctor tells you to stop. You may get drowsy or dizzy. Do not drive, use machinery, or do anything that needs mental alertness until you know how this medicine affects you. Do not stand or sit up quickly, especially if you are an older patient. This reduces the risk of dizzy or fainting spells. In some cases, you may be given additional medicines to help with side effects. Follow all directions for their use. Call your doctor or health care professional for advice if you get a fever, chills or sore throat, or other symptoms of a cold or flu. Do not treat yourself. This drug decreases your body's ability to fight infections. Try to avoid being around people who are sick. This medicine may increase your risk to bruise or bleed. Call your doctor or health care professional if you notice any unusual bleeding. You may need blood work done while you are taking this medicine. In some patients, this medicine may cause a serious brain infection that may cause death. If you have any problems seeing, thinking, speaking, walking, or standing, tell your doctor right away. If you cannot reach your doctor, urgently seek other source of medical care. Do not become pregnant while taking this medicine. Women should inform their doctor if they wish to become pregnant or think they might be pregnant. There is   a potential for serious side effects to an unborn child. Talk to your health care professional or pharmacist for more information. Do not breast-feed an infant while taking this medicine. Check with your doctor or health care professional if you get an attack of  severe diarrhea, nausea and vomiting, or if you sweat a lot. The loss of too much body fluid can make it dangerous for you to take this medicine. What side effects may I notice from receiving this medicine? Side effects that you should report to your doctor or health care professional as soon as possible: -allergic reactions like skin rash, itching or hives, swelling of the face, lips, or tongue -breathing problems -changes in hearing -changes in vision -fast, irregular heartbeat -feeling faint or lightheaded, falls -pain, tingling, numbness in the hands or feet -right upper belly pain -seizures -swelling of the ankles, feet, hands -unusual bleeding or bruising -unusually weak or tired -vomiting -yellowing of the eyes or skin Side effects that usually do not require medical attention (report to your doctor or health care professional if they continue or are bothersome): -changes in emotions or moods -constipation -diarrhea -loss of appetite -headache -irritation at site where injected -nausea This list may not describe all possible side effects. Call your doctor for medical advice about side effects. You may report side effects to FDA at 1-800-FDA-1088. Where should I keep my medicine? This drug is given in a hospital or clinic and will not be stored at home. NOTE: This sheet is a summary. It may not cover all possible information. If you have questions about this medicine, talk to your doctor, pharmacist, or health care provider.  2015, Elsevier/Gold Standard. (2012-11-11 12:46:32)  

## 2014-03-09 ENCOUNTER — Other Ambulatory Visit: Payer: Self-pay

## 2014-03-09 DIAGNOSIS — C9000 Multiple myeloma not having achieved remission: Secondary | ICD-10-CM

## 2014-03-09 MED ORDER — POMALIDOMIDE 4 MG PO CAPS: 4.0000 mg | ORAL_CAPSULE | Freq: Every day | ORAL | Status: DC

## 2014-03-10 ENCOUNTER — Other Ambulatory Visit: Payer: Self-pay

## 2014-03-10 DIAGNOSIS — C9000 Multiple myeloma not having achieved remission: Secondary | ICD-10-CM

## 2014-03-11 ENCOUNTER — Ambulatory Visit (HOSPITAL_BASED_OUTPATIENT_CLINIC_OR_DEPARTMENT_OTHER): Payer: BC Managed Care – PPO

## 2014-03-11 ENCOUNTER — Other Ambulatory Visit (HOSPITAL_BASED_OUTPATIENT_CLINIC_OR_DEPARTMENT_OTHER): Payer: BC Managed Care – PPO | Admitting: Lab

## 2014-03-11 DIAGNOSIS — C9000 Multiple myeloma not having achieved remission: Secondary | ICD-10-CM

## 2014-03-11 DIAGNOSIS — Z5112 Encounter for antineoplastic immunotherapy: Secondary | ICD-10-CM

## 2014-03-11 LAB — CMP (CANCER CENTER ONLY)
ALT(SGPT): 17 U/L (ref 10–47)
AST: 16 U/L (ref 11–38)
Albumin: 3.3 g/dL (ref 3.3–5.5)
Alkaline Phosphatase: 45 U/L (ref 26–84)
BUN, Bld: 12 mg/dL (ref 7–22)
CO2: 24 mEq/L (ref 18–33)
Calcium: 8.9 mg/dL (ref 8.0–10.3)
Chloride: 109 mEq/L — ABNORMAL HIGH (ref 98–108)
Creat: 0.7 mg/dl (ref 0.6–1.2)
Glucose, Bld: 82 mg/dL (ref 73–118)
Potassium: 3.4 mEq/L (ref 3.3–4.7)
Sodium: 141 mEq/L (ref 128–145)
Total Bilirubin: 0.5 mg/dl (ref 0.20–1.60)
Total Protein: 6.1 g/dL — ABNORMAL LOW (ref 6.4–8.1)

## 2014-03-11 LAB — CBC WITH DIFFERENTIAL (CANCER CENTER ONLY)
BASO#: 0.1 10*3/uL (ref 0.0–0.2)
BASO%: 3.2 % — ABNORMAL HIGH (ref 0.0–2.0)
EOS%: 5 % (ref 0.0–7.0)
Eosinophils Absolute: 0.2 10*3/uL (ref 0.0–0.5)
HCT: 30.5 % — ABNORMAL LOW (ref 34.8–46.6)
HGB: 10 g/dL — ABNORMAL LOW (ref 11.6–15.9)
LYMPH#: 0.9 10*3/uL (ref 0.9–3.3)
LYMPH%: 20.2 % (ref 14.0–48.0)
MCH: 26 pg (ref 26.0–34.0)
MCHC: 32.8 g/dL (ref 32.0–36.0)
MCV: 79 fL — ABNORMAL LOW (ref 81–101)
MONO#: 0.9 10*3/uL (ref 0.1–0.9)
MONO%: 20.4 % — ABNORMAL HIGH (ref 0.0–13.0)
NEUT#: 2.2 10*3/uL (ref 1.5–6.5)
NEUT%: 51.2 % (ref 39.6–80.0)
Platelets: 247 10*3/uL (ref 145–400)
RBC: 3.85 10*6/uL (ref 3.70–5.32)
RDW: 17.8 % — ABNORMAL HIGH (ref 11.1–15.7)
WBC: 4.4 10*3/uL (ref 3.9–10.0)

## 2014-03-11 MED ORDER — BORTEZOMIB CHEMO SQ INJECTION 3.5 MG (2.5MG/ML)
1.3000 mg/m2 | Freq: Once | INTRAMUSCULAR | Status: AC
  Administered 2014-03-11: 2.75 mg via SUBCUTANEOUS
  Filled 2014-03-11: qty 2.75

## 2014-03-11 MED ORDER — ONDANSETRON HCL 8 MG PO TABS
8.0000 mg | ORAL_TABLET | Freq: Once | ORAL | Status: AC
  Administered 2014-03-11: 8 mg via ORAL

## 2014-03-11 MED ORDER — ONDANSETRON HCL 8 MG PO TABS
ORAL_TABLET | ORAL | Status: AC
  Filled 2014-03-11: qty 1

## 2014-03-11 NOTE — Patient Instructions (Signed)
Bortezomib injection What is this medicine? BORTEZOMIB (bor TEZ oh mib) is a chemotherapy drug. It slows the growth of cancer cells. This medicine is used to treat multiple myeloma, and certain lymphomas, such as mantle-cell lymphoma. This medicine may be used for other purposes; ask your health care provider or pharmacist if you have questions. COMMON BRAND NAME(S): Velcade What should I tell my health care provider before I take this medicine? They need to know if you have any of these conditions: -diabetes -heart disease -irregular heartbeat -liver disease -on hemodialysis -low blood counts, like low white blood cells, platelets, or hemoglobin -peripheral neuropathy -taking medicine for blood pressure -an unusual or allergic reaction to bortezomib, mannitol, boron, other medicines, foods, dyes, or preservatives -pregnant or trying to get pregnant -breast-feeding How should I use this medicine? This medicine is for injection into a vein or for injection under the skin. It is given by a health care professional in a hospital or clinic setting. Talk to your pediatrician regarding the use of this medicine in children. Special care may be needed. Overdosage: If you think you have taken too much of this medicine contact a poison control center or emergency room at once. NOTE: This medicine is only for you. Do not share this medicine with others. What if I miss a dose? It is important not to miss your dose. Call your doctor or health care professional if you are unable to keep an appointment. What may interact with this medicine? This medicine may interact with the following medications: -ketoconazole -rifampin -ritonavir -St. John's Wort This list may not describe all possible interactions. Give your health care provider a list of all the medicines, herbs, non-prescription drugs, or dietary supplements you use. Also tell them if you smoke, drink alcohol, or use illegal drugs. Some items  may interact with your medicine. What should I watch for while using this medicine? Visit your doctor for checks on your progress. This drug may make you feel generally unwell. This is not uncommon, as chemotherapy can affect healthy cells as well as cancer cells. Report any side effects. Continue your course of treatment even though you feel ill unless your doctor tells you to stop. You may get drowsy or dizzy. Do not drive, use machinery, or do anything that needs mental alertness until you know how this medicine affects you. Do not stand or sit up quickly, especially if you are an older patient. This reduces the risk of dizzy or fainting spells. In some cases, you may be given additional medicines to help with side effects. Follow all directions for their use. Call your doctor or health care professional for advice if you get a fever, chills or sore throat, or other symptoms of a cold or flu. Do not treat yourself. This drug decreases your body's ability to fight infections. Try to avoid being around people who are sick. This medicine may increase your risk to bruise or bleed. Call your doctor or health care professional if you notice any unusual bleeding. You may need blood work done while you are taking this medicine. In some patients, this medicine may cause a serious brain infection that may cause death. If you have any problems seeing, thinking, speaking, walking, or standing, tell your doctor right away. If you cannot reach your doctor, urgently seek other source of medical care. Do not become pregnant while taking this medicine. Women should inform their doctor if they wish to become pregnant or think they might be pregnant. There is   a potential for serious side effects to an unborn child. Talk to your health care professional or pharmacist for more information. Do not breast-feed an infant while taking this medicine. Check with your doctor or health care professional if you get an attack of  severe diarrhea, nausea and vomiting, or if you sweat a lot. The loss of too much body fluid can make it dangerous for you to take this medicine. What side effects may I notice from receiving this medicine? Side effects that you should report to your doctor or health care professional as soon as possible: -allergic reactions like skin rash, itching or hives, swelling of the face, lips, or tongue -breathing problems -changes in hearing -changes in vision -fast, irregular heartbeat -feeling faint or lightheaded, falls -pain, tingling, numbness in the hands or feet -right upper belly pain -seizures -swelling of the ankles, feet, hands -unusual bleeding or bruising -unusually weak or tired -vomiting -yellowing of the eyes or skin Side effects that usually do not require medical attention (report to your doctor or health care professional if they continue or are bothersome): -changes in emotions or moods -constipation -diarrhea -loss of appetite -headache -irritation at site where injected -nausea This list may not describe all possible side effects. Call your doctor for medical advice about side effects. You may report side effects to FDA at 1-800-FDA-1088. Where should I keep my medicine? This drug is given in a hospital or clinic and will not be stored at home. NOTE: This sheet is a summary. It may not cover all possible information. If you have questions about this medicine, talk to your doctor, pharmacist, or health care provider.  2015, Elsevier/Gold Standard. (2012-11-11 12:46:32)  

## 2014-03-25 ENCOUNTER — Ambulatory Visit (HOSPITAL_BASED_OUTPATIENT_CLINIC_OR_DEPARTMENT_OTHER)
Admission: RE | Admit: 2014-03-25 | Discharge: 2014-03-25 | Disposition: A | Discharge status: Home / Self Care | Payer: BC Managed Care – PPO | Source: Ambulatory Visit | Attending: Hematology & Oncology | Admitting: Hematology & Oncology

## 2014-03-25 ENCOUNTER — Ambulatory Visit (HOSPITAL_BASED_OUTPATIENT_CLINIC_OR_DEPARTMENT_OTHER): Payer: BC Managed Care – PPO | Admitting: Lab

## 2014-03-25 ENCOUNTER — Telehealth: Payer: Self-pay | Admitting: Hematology & Oncology

## 2014-03-25 ENCOUNTER — Ambulatory Visit (HOSPITAL_BASED_OUTPATIENT_CLINIC_OR_DEPARTMENT_OTHER): Payer: BC Managed Care – PPO | Admitting: Hematology & Oncology

## 2014-03-25 ENCOUNTER — Ambulatory Visit (HOSPITAL_BASED_OUTPATIENT_CLINIC_OR_DEPARTMENT_OTHER): Payer: BC Managed Care – PPO

## 2014-03-25 VITALS — BP 127/91 | HR 81 | Temp 97.8°F

## 2014-03-25 DIAGNOSIS — C541 Malignant neoplasm of endometrium: Secondary | ICD-10-CM

## 2014-03-25 DIAGNOSIS — C9000 Multiple myeloma not having achieved remission: Secondary | ICD-10-CM

## 2014-03-25 DIAGNOSIS — C9002 Multiple myeloma in relapse: Secondary | ICD-10-CM

## 2014-03-25 DIAGNOSIS — Z5112 Encounter for antineoplastic immunotherapy: Secondary | ICD-10-CM

## 2014-03-25 DIAGNOSIS — Z8579 Personal history of other malignant neoplasms of lymphoid, hematopoietic and related tissues: Secondary | ICD-10-CM

## 2014-03-25 LAB — CBC WITH DIFFERENTIAL (CANCER CENTER ONLY)
BASO#: 0.1 10*3/uL (ref 0.0–0.2)
BASO%: 4 % — ABNORMAL HIGH (ref 0.0–2.0)
EOS%: 3.7 % (ref 0.0–7.0)
Eosinophils Absolute: 0.1 10*3/uL (ref 0.0–0.5)
HCT: 32.3 % — ABNORMAL LOW (ref 34.8–46.6)
HGB: 10.6 g/dL — ABNORMAL LOW (ref 11.6–15.9)
LYMPH#: 0.9 10*3/uL (ref 0.9–3.3)
LYMPH%: 26.6 % (ref 14.0–48.0)
MCH: 25.9 pg — ABNORMAL LOW (ref 26.0–34.0)
MCHC: 32.8 g/dL (ref 32.0–36.0)
MCV: 79 fL — ABNORMAL LOW (ref 81–101)
MONO#: 0.7 10*3/uL (ref 0.1–0.9)
MONO%: 19.2 % — ABNORMAL HIGH (ref 0.0–13.0)
NEUT#: 1.7 10*3/uL (ref 1.5–6.5)
NEUT%: 46.5 % (ref 39.6–80.0)
Platelets: 331 10*3/uL (ref 145–400)
RBC: 4.1 10*6/uL (ref 3.70–5.32)
RDW: 18.4 % — ABNORMAL HIGH (ref 11.1–15.7)
WBC: 3.5 10*3/uL — ABNORMAL LOW (ref 3.9–10.0)

## 2014-03-25 MED ORDER — SODIUM CHLORIDE 0.9 % IV SOLN
90.0000 mg | Freq: Once | INTRAVENOUS | Status: AC
  Administered 2014-03-25: 90 mg via INTRAVENOUS
  Filled 2014-03-25: qty 10

## 2014-03-25 MED ORDER — ONDANSETRON HCL 8 MG PO TABS
8.0000 mg | ORAL_TABLET | Freq: Once | ORAL | Status: AC
  Administered 2014-03-25: 8 mg via ORAL

## 2014-03-25 MED ORDER — SODIUM CHLORIDE 0.9 % IV SOLN
INTRAVENOUS | Status: DC
  Administered 2014-03-25: 10:00:00 via INTRAVENOUS

## 2014-03-25 MED ORDER — BORTEZOMIB CHEMO SQ INJECTION 3.5 MG (2.5MG/ML)
1.3000 mg/m2 | Freq: Once | INTRAMUSCULAR | Status: AC
  Administered 2014-03-25: 2.75 mg via SUBCUTANEOUS
  Filled 2014-03-25: qty 2.75

## 2014-03-25 MED ORDER — ONDANSETRON HCL 8 MG PO TABS
ORAL_TABLET | ORAL | Status: AC
  Filled 2014-03-25: qty 1

## 2014-03-25 NOTE — Patient Instructions (Signed)
Pamidronate injection What is this medicine? PAMIDRONATE (pa mi DROE nate) slows calcium loss from bones. It is used to treat high calcium blood levels from cancer or Paget's disease. It is also used to treat bone pain and prevent fractures from certain cancers that have spread to the bone. This medicine may be used for other purposes; ask your health care provider or pharmacist if you have questions. COMMON BRAND NAME(S): Aredia What should I tell my health care provider before I take this medicine? They need to know if you have any of these conditions: -aspirin-sensitive asthma -dental disease -kidney disease -an unusual or allergic reaction to pamidronate, other medicines, foods, dyes, or preservatives -pregnant or trying to get pregnant -breast-feeding How should I use this medicine? This medicine is for infusion into a vein. It is given by a health care professional in a hospital or clinic setting. Talk to your pediatrician regarding the use of this medicine in children. This medicine is not approved for use in children. Overdosage: If you think you have taken too much of this medicine contact a poison control center or emergency room at once. NOTE: This medicine is only for you. Do not share this medicine with others. What if I miss a dose? This does not apply. What may interact with this medicine? -certain antibiotics given by injection -medicines for inflammation or pain like ibuprofen, naproxen -some diuretics like bumetanide, furosemide -cyclosporine -parathyroid hormone -tacrolimus -teriparatide -thalidomide This list may not describe all possible interactions. Give your health care provider a list of all the medicines, herbs, non-prescription drugs, or dietary supplements you use. Also tell them if you smoke, drink alcohol, or use illegal drugs. Some items may interact with your medicine. What should I watch for while using this medicine? Visit your doctor or health care  professional for regular checkups. It may be some time before you see the benefit from this medicine. Do not stop taking your medicine unless your doctor tells you to. Your doctor may order blood tests or other tests to see how you are doing. Women should inform their doctor if they wish to become pregnant or think they might be pregnant. There is a potential for serious side effects to an unborn child. Talk to your health care professional or pharmacist for more information. You should make sure that you get enough calcium and vitamin D while you are taking this medicine. Discuss the foods you eat and the vitamins you take with your health care professional. Some people who take this medicine have severe bone, joint, and/or muscle pain. This medicine may also increase your risk for a broken thigh bone. Tell your doctor right away if you have pain in your upper leg or groin. Tell your doctor if you have any pain that does not go away or that gets worse. What side effects may I notice from receiving this medicine? Side effects that you should report to your doctor or health care professional as soon as possible: -allergic reactions like skin rash, itching or hives, swelling of the face, lips, or tongue -black or tarry stools -changes in vision -eye inflammation, pain -high blood pressure -jaw pain, especially burning or cramping -muscle weakness -numb, tingling pain -swelling of feet or hands -trouble passing urine or change in the amount of urine -unable to move easily Side effects that usually do not require medical attention (report to your doctor or health care professional if they continue or are bothersome): -bone, joint, or muscle pain -constipation -dizzy, drowsy -  fever -headache -loss of appetite -nausea, vomiting -pain at site where injected This list may not describe all possible side effects. Call your doctor for medical advice about side effects. You may report side effects to  FDA at 1-800-FDA-1088. Where should I keep my medicine? This drug is given in a hospital or clinic and will not be stored at home. NOTE: This sheet is a summary. It may not cover all possible information. If you have questions about this medicine, talk to your doctor, pharmacist, or health care provider.  2014, Elsevier/Gold Standard. (2010-07-15 08:49:49)   Bortezomib injection What is this medicine? BORTEZOMIB (bor TEZ oh mib) is a chemotherapy drug. It slows the growth of cancer cells. This medicine is used to treat multiple myeloma, and certain lymphomas, such as mantle-cell lymphoma. This medicine may be used for other purposes; ask your health care provider or pharmacist if you have questions. COMMON BRAND NAME(S): Velcade What should I tell my health care provider before I take this medicine? They need to know if you have any of these conditions: -diabetes -heart disease -irregular heartbeat -liver disease -on hemodialysis -low blood counts, like low white blood cells, platelets, or hemoglobin -peripheral neuropathy -taking medicine for blood pressure -an unusual or allergic reaction to bortezomib, mannitol, boron, other medicines, foods, dyes, or preservatives -pregnant or trying to get pregnant -breast-feeding How should I use this medicine? This medicine is for injection into a vein or for injection under the skin. It is given by a health care professional in a hospital or clinic setting. Talk to your pediatrician regarding the use of this medicine in children. Special care may be needed. Overdosage: If you think you have taken too much of this medicine contact a poison control center or emergency room at once. NOTE: This medicine is only for you. Do not share this medicine with others. What if I miss a dose? It is important not to miss your dose. Call your doctor or health care professional if you are unable to keep an appointment. What may interact with this  medicine? This medicine may interact with the following medications: -ketoconazole -rifampin -ritonavir -St. John's Wort This list may not describe all possible interactions. Give your health care provider a list of all the medicines, herbs, non-prescription drugs, or dietary supplements you use. Also tell them if you smoke, drink alcohol, or use illegal drugs. Some items may interact with your medicine. What should I watch for while using this medicine? Visit your doctor for checks on your progress. This drug may make you feel generally unwell. This is not uncommon, as chemotherapy can affect healthy cells as well as cancer cells. Report any side effects. Continue your course of treatment even though you feel ill unless your doctor tells you to stop. You may get drowsy or dizzy. Do not drive, use machinery, or do anything that needs mental alertness until you know how this medicine affects you. Do not stand or sit up quickly, especially if you are an older patient. This reduces the risk of dizzy or fainting spells. In some cases, you may be given additional medicines to help with side effects. Follow all directions for their use. Call your doctor or health care professional for advice if you get a fever, chills or sore throat, or other symptoms of a cold or flu. Do not treat yourself. This drug decreases your body's ability to fight infections. Try to avoid being around people who are sick. This medicine may increase your risk to  bruise or bleed. Call your doctor or health care professional if you notice any unusual bleeding. You may need blood work done while you are taking this medicine. In some patients, this medicine may cause a serious brain infection that may cause death. If you have any problems seeing, thinking, speaking, walking, or standing, tell your doctor right away. If you cannot reach your doctor, urgently seek other source of medical care. Do not become pregnant while taking this  medicine. Women should inform their doctor if they wish to become pregnant or think they might be pregnant. There is a potential for serious side effects to an unborn child. Talk to your health care professional or pharmacist for more information. Do not breast-feed an infant while taking this medicine. Check with your doctor or health care professional if you get an attack of severe diarrhea, nausea and vomiting, or if you sweat a lot. The loss of too much body fluid can make it dangerous for you to take this medicine. What side effects may I notice from receiving this medicine? Side effects that you should report to your doctor or health care professional as soon as possible: -allergic reactions like skin rash, itching or hives, swelling of the face, lips, or tongue -breathing problems -changes in hearing -changes in vision -fast, irregular heartbeat -feeling faint or lightheaded, falls -pain, tingling, numbness in the hands or feet -right upper belly pain -seizures -swelling of the ankles, feet, hands -unusual bleeding or bruising -unusually weak or tired -vomiting -yellowing of the eyes or skin Side effects that usually do not require medical attention (report to your doctor or health care professional if they continue or are bothersome): -changes in emotions or moods -constipation -diarrhea -loss of appetite -headache -irritation at site where injected -nausea This list may not describe all possible side effects. Call your doctor for medical advice about side effects. You may report side effects to FDA at 1-800-FDA-1088. Where should I keep my medicine? This drug is given in a hospital or clinic and will not be stored at home. NOTE: This sheet is a summary. It may not cover all possible information. If you have questions about this medicine, talk to your doctor, pharmacist, or health care provider.  2015, Elsevier/Gold Standard. (2012-11-11 12:46:32)

## 2014-03-25 NOTE — Progress Notes (Signed)
Hematology and Oncology Follow Up Visit  JAMANI CRANE [id number] October 02, 1950 64 y.o. 03/25/2014   Principle Diagnosis:   Recurrent lambda light chain myeloma  History of stage I endometrial carcinoma  Current Therapy:    Pomalidomide/Velcade  Aredia 60 mg IV Q. month     Interim History:  Ms.  Casas is back for followup. She is doing quite well.  She was seen at [facility] a month ago. She is a candidate for transplant. It does not sound like she'll have transplant probably until late April or early May.  His Henretty is quite excited that her grandson folic the hospital. He was born orally and weight onto pounds. He now weighs about 8 pounds. He's been in the hospital for 3 months. She had her husband will probably go out to [place] to see him sometime in March or April.   Overall, Ms. Ta has done well. She is very happy that she is off Decadron. She's had no problems with Pomalidomide. She may have little bit of swelling in the ankles and feet at the end of the 21 days.   There's been no problems with nausea vomiting. She has had some fatigue.  There has been no diarrhea. She's had no rashes. Patient is on aspirin. She also is on acyclovir.  She did have a bone survey today. Everything looked very stable. No new lesions were noted.   Overall, her performance status is ECOG 1 Medications: Allergies:  Allergies  Allergen Reactions  . Codeine Nausea Only    Past Medical History, Surgical history, Social history, and Family History were reviewed and updated.  Review of Systems: As above  Physical Exam:  oral temperature is 97.8 F (36.6 C). Her blood pressure is 127/91 and her pulse is 81.   Well-developed and well-nourished [name]. Head and neck exam shows no ocular or oral lesion. There are no palpable cervical or supraclavicular lymph nodes. Lungs are clear. Cardiac exam regular rate and rhythm with no murmurs rubs or bruits.  Abdomen is soft. She has good bowel sounds. There is no fluid wave. There is no palpable liver or spleen tip. Extremities shows no clubbing, cyanosis or edema. Neurological exam shows no focal neurological deficits. Skin exam no rashes, ecchymosis or petechia.  Lab Results  Component Value Date   WBC 3.5* 03/25/2014   HGB 10.6* 03/25/2014   HCT 32.3* 03/25/2014   MCV 79* 03/25/2014   PLT 331 03/25/2014     Chemistry      Component Value Date/Time   NA 141 03/11/2014 0927   NA 140 12/03/2013 1007   NA 141 09/04/2013 1049   K 3.4 03/11/2014 0927   K 3.8 12/03/2013 1007   K 4.0 09/04/2013 1049   CL 109* 03/11/2014 0927   CL 105 12/03/2013 1007   CO2 24 03/11/2014 0927   CO2 20 12/03/2013 1007   CO2 27 09/04/2013 1049   BUN 12 03/11/2014 0927   BUN 11 12/03/2013 1007   BUN 18.3 09/04/2013 1049   CREATININE 0.7 03/11/2014 0927   CREATININE 0.87 12/03/2013 1007   CREATININE 0.9 09/04/2013 1049      Component Value Date/Time   CALCIUM 8.9 03/11/2014 0927   CALCIUM 9.3 12/03/2013 1007   CALCIUM 9.8 09/04/2013 1049   ALKPHOS 45 03/11/2014 0927   ALKPHOS 198* 12/03/2013 1007   ALKPHOS 45 09/04/2013 1049   AST 16 03/11/2014 0927   AST 12 12/03/2013 1007   AST 12 09/04/2013 1049  ALT 17 03/11/2014 0927   ALT 12 12/03/2013 1007   ALT 17 09/04/2013 1049   BILITOT 0.50 03/11/2014 0927   BILITOT 0.4 12/03/2013 1007   BILITOT 0.47 09/04/2013 1049         Impression and Plan: Ms. Gurganious is 64 year old female. She has recurrent lambda light chain myeloma.  She now is on Pomalidomide with Velcade. She's doing well with this. She is tolerating it well. The last urine studies showed a marked decline in urinary protein. We will recheck her urine studies when we see her back.  I think a transplant would be a fantastic idea for her. It sounds like this will will be done in about 2 months. This actually will work out pretty well because she can then go out to [place] to see her  new grandson. We will continue her on weekly Velcade. She'll do the Pomalidomide 21 days on and 7 days off.  I'll plan to see her back myself in about 3 or 4 weeks. When we see her back, we will do the Aredia.  I spent about 30 minutes with her today  [name], MD 2/24/20169:54 AM

## 2014-03-25 NOTE — Telephone Encounter (Signed)
Endometrial cancer - Primary  ICD-9-CM: 182.0 ICD-10-CM: C54.1  J9041 - bortezomib SQ (VELCADE) chemo injection 2.75 mg J2405 - ondansetron (ZOFRAN) tablet 8 mg J2430 - Aredia       Outpatient No PPA required for codes above. Active policy

## 2014-03-26 ENCOUNTER — Ambulatory Visit: Payer: BC Managed Care – PPO | Admitting: Hematology & Oncology

## 2014-03-26 ENCOUNTER — Ambulatory Visit: Payer: BC Managed Care – PPO

## 2014-03-26 ENCOUNTER — Other Ambulatory Visit: Payer: BC Managed Care – PPO | Admitting: Lab

## 2014-03-30 ENCOUNTER — Telehealth: Payer: Self-pay | Admitting: *Deleted

## 2014-03-30 NOTE — Telephone Encounter (Signed)
-----   Message from [name], MD sent at 03/27/2014  4:19 PM EST ----- Call - lambda light chain is still nice and low!! pete

## 2014-04-01 ENCOUNTER — Telehealth: Payer: Self-pay | Admitting: *Deleted

## 2014-04-01 NOTE — Telephone Encounter (Addendum)
Patient aware  ----- Message from [name], MD sent at 03/27/2014  4:19 PM EST ----- Call - lambda light chain is still nice and low!! pete

## 2014-04-03 LAB — PROTEIN ELECTROPHORESIS, SERUM, WITH REFLEX
Albumin ELP: 62.4 % (ref 55.8–66.1)
Alpha-1-Globulin: 4.1 % (ref 2.9–4.9)
Alpha-2-Globulin: 11.1 % (ref 7.1–11.8)
Beta 2: 4.6 % (ref 3.2–6.5)
Beta Globulin: 7.5 % — ABNORMAL HIGH (ref 4.7–7.2)
Gamma Globulin: 10.3 % — ABNORMAL LOW (ref 11.1–18.8)
Total Protein, Serum Electrophoresis: 6.4 g/dL (ref 6.0–8.3)

## 2014-04-03 LAB — COMPREHENSIVE METABOLIC PANEL
ALT: 15 U/L (ref 0–35)
AST: 14 U/L (ref 0–37)
Albumin: 4.1 g/dL (ref 3.5–5.2)
Alkaline Phosphatase: 51 U/L (ref 39–117)
BUN: 11 mg/dL (ref 6–23)
CO2: 20 mEq/L (ref 19–32)
Calcium: 9 mg/dL (ref 8.4–10.5)
Chloride: 105 mEq/L (ref 96–112)
Creatinine, Ser: 0.85 mg/dL (ref 0.50–1.10)
Glucose, Bld: 85 mg/dL (ref 70–99)
Potassium: 3.8 mEq/L (ref 3.5–5.3)
Sodium: 139 mEq/L (ref 135–145)
Total Bilirubin: 0.4 mg/dL (ref 0.2–1.2)
Total Protein: 6.4 g/dL (ref 6.0–8.3)

## 2014-04-03 LAB — IGG, IGA, IGM
IgA: 86 mg/dL (ref 69–380)
IgG (Immunoglobin G), Serum: 663 mg/dL — ABNORMAL LOW (ref 690–1700)
IgM, Serum: 22 mg/dL — ABNORMAL LOW (ref 52–322)

## 2014-04-03 LAB — ALPHA-THALASSEMIA GENOTYPR

## 2014-04-03 LAB — KAPPA/LAMBDA LIGHT CHAINS
Kappa free light chain: 1.09 mg/dL (ref 0.33–1.94)
Kappa:Lambda Ratio: 0.36 (ref 0.26–1.65)
Lambda Free Lght Chn: 3.05 mg/dL — ABNORMAL HIGH (ref 0.57–2.63)

## 2014-04-03 LAB — IFE INTERPRETATION

## 2014-04-03 LAB — LACTATE DEHYDROGENASE: LDH: 166 U/L (ref 94–250)

## 2014-04-08 ENCOUNTER — Other Ambulatory Visit: Payer: Self-pay | Admitting: *Deleted

## 2014-04-08 DIAGNOSIS — C9000 Multiple myeloma not having achieved remission: Secondary | ICD-10-CM

## 2014-04-08 MED ORDER — POMALIDOMIDE 4 MG PO CAPS: 4.0000 mg | ORAL_CAPSULE | Freq: Every day | ORAL | Status: DC

## 2014-04-16 ENCOUNTER — Other Ambulatory Visit: Payer: Self-pay | Admitting: Family

## 2014-04-22 ENCOUNTER — Telehealth: Payer: Self-pay | Admitting: Hematology & Oncology

## 2014-04-22 NOTE — Telephone Encounter (Signed)
[facility] called again stating they have made several attempts to reach patient on home and cell phone.  They have not been able to reach patient and will send out a letter

## 2014-04-23 ENCOUNTER — Encounter: Payer: Self-pay | Admitting: Family

## 2014-04-23 ENCOUNTER — Ambulatory Visit (HOSPITAL_BASED_OUTPATIENT_CLINIC_OR_DEPARTMENT_OTHER): Payer: BC Managed Care – PPO

## 2014-04-23 ENCOUNTER — Ambulatory Visit (HOSPITAL_BASED_OUTPATIENT_CLINIC_OR_DEPARTMENT_OTHER): Payer: BC Managed Care – PPO | Admitting: Family

## 2014-04-23 VITALS — BP 112/63 | HR 77 | Temp 98.0°F | Resp 20 | Wt 197.0 lb

## 2014-04-23 DIAGNOSIS — C9002 Multiple myeloma in relapse: Secondary | ICD-10-CM | POA: Diagnosis not present

## 2014-04-23 DIAGNOSIS — Z5112 Encounter for antineoplastic immunotherapy: Secondary | ICD-10-CM

## 2014-04-23 DIAGNOSIS — C541 Malignant neoplasm of endometrium: Secondary | ICD-10-CM

## 2014-04-23 DIAGNOSIS — C9000 Multiple myeloma not having achieved remission: Secondary | ICD-10-CM

## 2014-04-23 DIAGNOSIS — Z8579 Personal history of other malignant neoplasms of lymphoid, hematopoietic and related tissues: Secondary | ICD-10-CM

## 2014-04-23 LAB — CBC WITH DIFFERENTIAL (CANCER CENTER ONLY)
BASO#: 0.2 10*3/uL (ref 0.0–0.2)
BASO%: 4.6 % — ABNORMAL HIGH (ref 0.0–2.0)
EOS%: 4.8 % (ref 0.0–7.0)
Eosinophils Absolute: 0.2 10*3/uL (ref 0.0–0.5)
HCT: 32.3 % — ABNORMAL LOW (ref 34.8–46.6)
HGB: 10.6 g/dL — ABNORMAL LOW (ref 11.6–15.9)
LYMPH#: 1.1 10*3/uL (ref 0.9–3.3)
LYMPH%: 24.8 % (ref 14.0–48.0)
MCH: 25.9 pg — ABNORMAL LOW (ref 26.0–34.0)
MCHC: 32.8 g/dL (ref 32.0–36.0)
MCV: 79 fL — ABNORMAL LOW (ref 81–101)
MONO#: 0.8 10*3/uL (ref 0.1–0.9)
MONO%: 17.6 % — ABNORMAL HIGH (ref 0.0–13.0)
NEUT#: 2.2 10*3/uL (ref 1.5–6.5)
NEUT%: 48.2 % (ref 39.6–80.0)
Platelets: 270 10*3/uL (ref 145–400)
RBC: 4.09 10*6/uL (ref 3.70–5.32)
RDW: 17.2 % — ABNORMAL HIGH (ref 11.1–15.7)
WBC: 4.6 10*3/uL (ref 3.9–10.0)

## 2014-04-23 LAB — CMP (CANCER CENTER ONLY)
ALT(SGPT): 20 U/L (ref 10–47)
AST: 17 U/L (ref 11–38)
Albumin: 3.8 g/dL (ref 3.3–5.5)
Alkaline Phosphatase: 51 U/L (ref 26–84)
BUN, Bld: 14 mg/dL (ref 7–22)
CO2: 29 mEq/L (ref 18–33)
Calcium: 9.5 mg/dL (ref 8.0–10.3)
Chloride: 104 mEq/L (ref 98–108)
Creat: 0.9 mg/dl (ref 0.6–1.2)
Glucose, Bld: 120 mg/dL — ABNORMAL HIGH (ref 73–118)
Potassium: 3.1 mEq/L — ABNORMAL LOW (ref 3.3–4.7)
Sodium: 141 mEq/L (ref 128–145)
Total Bilirubin: 0.6 mg/dl (ref 0.20–1.60)
Total Protein: 7.1 g/dL (ref 6.4–8.1)

## 2014-04-23 MED ORDER — PAMIDRONATE DISODIUM 90 MG/10ML IV SOLN
90.0000 mg | Freq: Once | INTRAVENOUS | Status: AC
  Administered 2014-04-23: 90 mg via INTRAVENOUS
  Filled 2014-04-23: qty 10

## 2014-04-23 MED ORDER — ONDANSETRON HCL 8 MG PO TABS
ORAL_TABLET | ORAL | Status: AC
  Filled 2014-04-23: qty 1

## 2014-04-23 MED ORDER — ONDANSETRON HCL 8 MG PO TABS
8.0000 mg | ORAL_TABLET | Freq: Once | ORAL | Status: AC
  Administered 2014-04-23: 8 mg via ORAL

## 2014-04-23 MED ORDER — BORTEZOMIB CHEMO SQ INJECTION 3.5 MG (2.5MG/ML)
1.3000 mg/m2 | Freq: Once | INTRAMUSCULAR | Status: AC
  Administered 2014-04-23: 2.75 mg via SUBCUTANEOUS
  Filled 2014-04-23: qty 2.75

## 2014-04-23 MED ORDER — SODIUM CHLORIDE 0.9 % IV SOLN
Freq: Once | INTRAVENOUS | Status: AC
  Administered 2014-04-23: 11:00:00 via INTRAVENOUS

## 2014-04-23 NOTE — Patient Instructions (Signed)
Pamidronate injection What is this medicine? PAMIDRONATE (pa mi DROE nate) slows calcium loss from bones. It is used to treat high calcium blood levels from cancer or Paget's disease. It is also used to treat bone pain and prevent fractures from certain cancers that have spread to the bone. This medicine may be used for other purposes; ask your health care provider or pharmacist if you have questions. COMMON BRAND NAME(S): Aredia What should I tell my health care provider before I take this medicine? They need to know if you have any of these conditions: -aspirin-sensitive asthma -dental disease -kidney disease -an unusual or allergic reaction to pamidronate, other medicines, foods, dyes, or preservatives -pregnant or trying to get pregnant -breast-feeding How should I use this medicine? This medicine is for infusion into a vein. It is given by a health care professional in a hospital or clinic setting. Talk to your pediatrician regarding the use of this medicine in children. This medicine is not approved for use in children. Overdosage: If you think you have taken too much of this medicine contact a poison control center or emergency room at once. NOTE: This medicine is only for you. Do not share this medicine with others. What if I miss a dose? This does not apply. What may interact with this medicine? -certain antibiotics given by injection -medicines for inflammation or pain like ibuprofen, naproxen -some diuretics like bumetanide, furosemide -cyclosporine -parathyroid hormone -tacrolimus -teriparatide -thalidomide This list may not describe all possible interactions. Give your health care provider a list of all the medicines, herbs, non-prescription drugs, or dietary supplements you use. Also tell them if you smoke, drink alcohol, or use illegal drugs. Some items may interact with your medicine. What should I watch for while using this medicine? Visit your doctor or health care  professional for regular checkups. It may be some time before you see the benefit from this medicine. Do not stop taking your medicine unless your doctor tells you to. Your doctor may order blood tests or other tests to see how you are doing. Women should inform their doctor if they wish to become pregnant or think they might be pregnant. There is a potential for serious side effects to an unborn child. Talk to your health care professional or pharmacist for more information. You should make sure that you get enough calcium and vitamin D while you are taking this medicine. Discuss the foods you eat and the vitamins you take with your health care professional. Some people who take this medicine have severe bone, joint, and/or muscle pain. This medicine may also increase your risk for a broken thigh bone. Tell your doctor right away if you have pain in your upper leg or groin. Tell your doctor if you have any pain that does not go away or that gets worse. What side effects may I notice from receiving this medicine? Side effects that you should report to your doctor or health care professional as soon as possible: -allergic reactions like skin rash, itching or hives, swelling of the face, lips, or tongue -black or tarry stools -changes in vision -eye inflammation, pain -high blood pressure -jaw pain, especially burning or cramping -muscle weakness -numb, tingling pain -swelling of feet or hands -trouble passing urine or change in the amount of urine -unable to move easily Side effects that usually do not require medical attention (report to your doctor or health care professional if they continue or are bothersome): -bone, joint, or muscle pain -constipation -dizzy, drowsy -  fever -headache -loss of appetite -nausea, vomiting -pain at site where injected This list may not describe all possible side effects. Call your doctor for medical advice about side effects. You may report side effects to  FDA at 1-800-FDA-1088. Where should I keep my medicine? This drug is given in a hospital or clinic and will not be stored at home. NOTE: This sheet is a summary. It may not cover all possible information. If you have questions about this medicine, talk to your doctor, pharmacist, or health care provider.  2015, [employer]vier/Gold Standard. (2010-07-15 08:49:49)

## 2014-04-23 NOTE — Progress Notes (Signed)
Hematology and Oncology Follow Up Visit  Emily Greer [id number] April 23, 1950 64 y.o. 04/23/2014   Principle Diagnosis:  Recurrent lambda light chain myeloma History of stage I endometrial carcinoma  Current Therapy:   Pomalidomide/Velcade Aredia 60 mg IV Q. month    Interim History:  Emily Greer is here today for a follow-up. She is doing well. She is very happy her son and his family are moving to [place] next month and will be much closer to her.  She has an appointment at [facility] next Thursday for labs work in preparation for her transplant. Her transplant will be in April.  She denies fever, chills, n/v, cough, rash, dizziness, SOB, chest pain, palpitations, abdominal pain, constipation, diarrhea, blood in urine or stool. No fatigue.  Her sinuses have been bothering her a little. She is a little hoarse when talking today. No sore throat.  She sees her gynecologist on Monday and plans to have her schedule her mammogram for this year at that time.  No swelling, tenderness, numbness or tingling in her extremities. She does have a little cramping in her hand after the Pomalidomide. She describes this as mild and tolerable, passing after a few hours.  Her appetite is good and she is staying hydrated. Her weight is stable.  Medications:    Medication List       This list is accurate as of: 04/23/14 10:35 AM.  Always use your most recent med list.               acyclovir 400 MG tablet  Commonly known as:  ZOVIRAX  Take 1 tablet (400 mg total) by mouth 2 (two) times daily.     allopurinol 100 MG tablet  Commonly known as:  ZYLOPRIM  Take 100 mg by mouth daily.     aspirin 81 MG tablet  Take 81 mg by mouth daily.     Biotin 5000 MCG Tabs  Take by mouth every morning.     calcium carbonate 600 MG Tabs tablet  Commonly known as:  OS-CAL  Take 600 mg by mouth daily with breakfast.     furosemide 20 MG tablet  Commonly known as:  LASIX  TAKE 1 TABLET BY MOUTH EVERY DAY  AS NEEDED FOR SWELLING OF ARMS/LEGS     loperamide 2 MG capsule  Commonly known as:  IMODIUM  Take 2 mg by mouth as needed for diarrhea or loose stools. For diarrhea     loratadine 10 MG tablet  Commonly known as:  CLARITIN  Take 10 mg by mouth every morning.     LORazepam 0.5 MG tablet  Commonly known as:  ATIVAN  Take 1 tablet (0.5 mg total) by mouth every 6 (six) hours as needed (Nausea or vomiting).     metoprolol succinate 50 MG 24 hr tablet  Commonly known as:  TOPROL-XL  Take 50 mg by mouth every morning.     nystatin 100000 UNIT/ML suspension  Commonly known as:  MYCOSTATIN  Use as directed 5 mLs in the mouth or throat 4 (four) times daily.     Olmesartan-Amlodipine-HCTZ 20-5-12.5 MG Tabs  Take 1 tablet by mouth every morning.     ondansetron 8 MG tablet  Commonly known as:  ZOFRAN  Take 8 mg by mouth every 8 (eight) hours as needed.     pomalidomide 4 MG capsule  Commonly known as:  POMALYST  Take 1 capsule (4 mg total) by mouth daily. Take with water on days 1-21. Repeat  every 28 days. Authorization Number: [id number]     potassium chloride SA 20 MEQ tablet  Commonly known as:  K-DUR,KLOR-CON  TAKE 1 TABLET BY MOUTH DAILY     prochlorperazine 10 MG tablet  Commonly known as:  COMPAZINE  Take 1 tablet (10 mg total) by mouth every 6 (six) hours as needed (Nausea or vomiting).     Vitamin D3 2000 UNITS Tabs  Take 1 tablet by mouth daily.        Allergies:  Allergies  Allergen Reactions  . Codeine Nausea Only    Past Medical History, Surgical history, Social history, and Family History were reviewed and updated.  Review of Systems: All other 10 point review of systems is negative.   Physical Exam:  weight is 197 lb (89.359 kg). Her temperature is 98 F (36.7 C). Her blood pressure is 112/63 and her pulse is 77. Her respiration is 20.   Wt Readings from Last 3 Encounters:  04/23/14 197 lb (89.359 kg)  02/25/14 200 lb (90.719 kg)  01/29/14 202 lb  (91.627 kg)    Ocular: Sclerae unicteric, pupils equal, round and reactive to light Ear-nose-throat: Oropharynx clear, dentition fair Lymphatic: No cervical or supraclavicular adenopathy Lungs no rales or rhonchi, good excursion bilaterally Heart regular rate and rhythm, no murmur appreciated Abd soft, nontender, positive bowel sounds MSK no focal spinal tenderness, no joint edema Neuro: non-focal, well-oriented, appropriate affect Breasts: Deferred  Lab Results  Component Value Date   WBC 4.6 04/23/2014   HGB 10.6* 04/23/2014   HCT 32.3* 04/23/2014   MCV 79* 04/23/2014   PLT 270 04/23/2014   Lab Results  Component Value Date   FERRITIN 81 04/29/2012   IRON 95 04/29/2012   TIBC 411 04/29/2012   UIBC 316 04/29/2012   IRONPCTSAT 23 04/29/2012   Lab Results  Component Value Date   RBC 4.09 04/23/2014   Lab Results  Component Value Date   KPAFRELGTCHN 1.09 03/25/2014   LAMBDASER 3.05* 03/25/2014   KAPLAMBRATIO 0.36 03/25/2014   Lab Results  Component Value Date   IGGSERUM 663* 03/25/2014   IGA 86 03/25/2014   IGMSERUM 22* 03/25/2014   Lab Results  Component Value Date   TOTALPROTELP 6.4 03/25/2014   ALBUMINELP 62.4 03/25/2014   A1GS 4.1 03/25/2014   A2GS 11.1 03/25/2014   BETS 7.5* 03/25/2014   BETA2SER 4.6 03/25/2014   GAMS 10.3* 03/25/2014   MSPIKE NOT DET 03/25/2014   SPEI * 03/25/2014     Chemistry      Component Value Date/Time   NA 139 03/25/2014 0833   NA 141 03/11/2014 0927   NA 141 09/04/2013 1049   K 3.8 03/25/2014 0833   K 3.4 03/11/2014 0927   K 4.0 09/04/2013 1049   CL 105 03/25/2014 0833   CL 109* 03/11/2014 0927   CO2 20 03/25/2014 0833   CO2 24 03/11/2014 0927   CO2 27 09/04/2013 1049   BUN 11 03/25/2014 0833   BUN 12 03/11/2014 0927   BUN 18.3 09/04/2013 1049   CREATININE 0.85 03/25/2014 0833   CREATININE 0.7 03/11/2014 0927   CREATININE 0.9 09/04/2013 1049      Component Value Date/Time   CALCIUM 9.0 03/25/2014 0833    CALCIUM 8.9 03/11/2014 0927   CALCIUM 9.8 09/04/2013 1049   ALKPHOS 51 03/25/2014 0833   ALKPHOS 45 03/11/2014 0927   ALKPHOS 45 09/04/2013 1049   AST 14 03/25/2014 0833   AST 16 03/11/2014 0927   AST 12 09/04/2013  1049   ALT 15 03/25/2014 0833   ALT 17 03/11/2014 0927   ALT 17 09/04/2013 1049   BILITOT 0.4 03/25/2014 0833   BILITOT 0.50 03/11/2014 0927   BILITOT 0.47 09/04/2013 1049      Impression and Plan: Emily Greer is 64 year old female with recurrent lambda light chain myeloma. She has done well with the Pomalidomide with Velcade. In December her urine studies showed a marked decline in protein from 53 to 5.  Her CBC today was ok. We will see what the rest of her lab work shows.  She goes to [facility] next Thursday to have blood work done in preparation for her transplant in April. She is very happy about this.  We will follow-up with her after her transplant is complete.  She will get her Aradia today.  She knows to call here with any questions or concerns. We can certainly see sooner if need be.   Eliezer Bottom, NP 3/24/201610:35 AM

## 2014-04-24 LAB — IGG, IGA, IGM
IgA: 118 mg/dL (ref 69–380)
IgG (Immunoglobin G), Serum: 782 mg/dL (ref 690–1700)
IgM, Serum: 29 mg/dL — ABNORMAL LOW (ref 52–322)

## 2014-04-24 LAB — KAPPA/LAMBDA LIGHT CHAINS
Kappa free light chain: 1.51 mg/dL (ref 0.33–1.94)
Kappa:Lambda Ratio: 0.38 (ref 0.26–1.65)
Lambda Free Lght Chn: 3.97 mg/dL — ABNORMAL HIGH (ref 0.57–2.63)

## 2014-04-29 ENCOUNTER — Other Ambulatory Visit: Payer: BC Managed Care – PPO

## 2014-05-02 LAB — UIFE/LIGHT CHAINS/TP QN, 24-HR UR
Albumin, U: DETECTED
Alpha 1, Urine: DETECTED — AB
Alpha 2, Urine: DETECTED — AB
Beta, Urine: DETECTED — AB
Gamma Globulin, Urine: DETECTED — AB
Time: 24 hours
Total Protein, Urine-Ur/day: 88 mg/d (ref <150)
Total Protein, Urine: 8 mg/dL (ref 5–24)
Volume, Urine: 1100 mL

## 2014-05-02 LAB — 24 HR URINE,KAPPA/LAMBDA LIGHT CHAINS
24H Urine Volume: 1100 mL/24 h
Measured Kappa Chain: <0.4 mg/dL (ref <2.00)
Measured Lambda Chain: 1.06 mg/dL (ref <2.00)
Total Lambda Chain: 11.66 mg/24 h

## 2014-05-04 ENCOUNTER — Telehealth: Payer: Self-pay | Admitting: *Deleted

## 2014-05-04 NOTE — Telephone Encounter (Signed)
-----   Message from [name], MD sent at 05/04/2014  7:01 AM EDT ----- Call - myeloma protein in urine is still coming down.  pete

## 2014-05-05 DIAGNOSIS — C9001 Multiple myeloma in remission: Secondary | ICD-10-CM | POA: Insufficient documentation

## 2014-05-05 DIAGNOSIS — C9000 Multiple myeloma not having achieved remission: Secondary | ICD-10-CM | POA: Insufficient documentation

## 2014-05-14 ENCOUNTER — Other Ambulatory Visit: Payer: Self-pay | Admitting: *Deleted

## 2014-05-14 DIAGNOSIS — C9000 Multiple myeloma not having achieved remission: Secondary | ICD-10-CM

## 2014-05-14 MED ORDER — POMALIDOMIDE 4 MG PO CAPS
4.0000 mg | ORAL_CAPSULE | Freq: Every day | ORAL | Status: DC
Start: 2014-05-14 — End: 2014-06-01

## 2014-05-19 DIAGNOSIS — Z006 Encounter for examination for normal comparison and control in clinical research program: Secondary | ICD-10-CM | POA: Insufficient documentation

## 2014-05-22 ENCOUNTER — Other Ambulatory Visit (HOSPITAL_BASED_OUTPATIENT_CLINIC_OR_DEPARTMENT_OTHER): Payer: BC Managed Care – PPO

## 2014-05-22 ENCOUNTER — Ambulatory Visit (HOSPITAL_BASED_OUTPATIENT_CLINIC_OR_DEPARTMENT_OTHER): Payer: BC Managed Care – PPO

## 2014-05-22 ENCOUNTER — Other Ambulatory Visit: Payer: Self-pay | Admitting: *Deleted

## 2014-05-22 DIAGNOSIS — C9000 Multiple myeloma not having achieved remission: Secondary | ICD-10-CM

## 2014-05-22 DIAGNOSIS — C9002 Multiple myeloma in relapse: Secondary | ICD-10-CM

## 2014-05-22 LAB — LACTATE DEHYDROGENASE: LDH: 140 U/L (ref 94–250)

## 2014-05-22 LAB — COMPREHENSIVE METABOLIC PANEL
ALT: 16 U/L (ref 0–35)
AST: 9 U/L (ref 0–37)
Albumin: 3.7 g/dL (ref 3.5–5.2)
Alkaline Phosphatase: 38 U/L — ABNORMAL LOW (ref 39–117)
BUN: 16 mg/dL (ref 6–23)
CO2: 27 mEq/L (ref 19–32)
Calcium: 8.6 mg/dL (ref 8.4–10.5)
Chloride: 107 mEq/L (ref 96–112)
Creatinine, Ser: 0.85 mg/dL (ref 0.50–1.10)
Glucose, Bld: 79 mg/dL (ref 70–99)
Potassium: 3.7 mEq/L (ref 3.5–5.3)
Sodium: 142 mEq/L (ref 135–145)
Total Bilirubin: 0.6 mg/dL (ref 0.2–1.2)
Total Protein: 5.8 g/dL — ABNORMAL LOW (ref 6.0–8.3)

## 2014-05-22 LAB — CBC WITH DIFFERENTIAL (CANCER CENTER ONLY)
BASO#: 0 10*3/uL (ref 0.0–0.2)
BASO%: 0.2 % (ref 0.0–2.0)
EOS%: 0 % (ref 0.0–7.0)
Eosinophils Absolute: 0 10*3/uL (ref 0.0–0.5)
HCT: 28 % — ABNORMAL LOW (ref 34.8–46.6)
HGB: 9.2 g/dL — ABNORMAL LOW (ref 11.6–15.9)
LYMPH#: 0.2 10*3/uL — ABNORMAL LOW (ref 0.9–3.3)
LYMPH%: 3.1 % — ABNORMAL LOW (ref 14.0–48.0)
MCH: 26 pg (ref 26.0–34.0)
MCHC: 32.9 g/dL (ref 32.0–36.0)
MCV: 79 fL — ABNORMAL LOW (ref 81–101)
MONO#: 0.7 10*3/uL (ref 0.1–0.9)
MONO%: 14.3 % — ABNORMAL HIGH (ref 0.0–13.0)
NEUT#: 4 10*3/uL (ref 1.5–6.5)
NEUT%: 82.4 % — ABNORMAL HIGH (ref 39.6–80.0)
Platelets: 192 10*3/uL (ref 145–400)
RBC: 3.54 10*6/uL — ABNORMAL LOW (ref 3.70–5.32)
RDW: 17 % — ABNORMAL HIGH (ref 11.1–15.7)
WBC: 4.9 10*3/uL (ref 3.9–10.0)

## 2014-05-22 LAB — MAGNESIUM: Magnesium: 1.9 mg/dL (ref 1.5–2.5)

## 2014-05-22 MED ORDER — HEPARIN SOD (PORK) LOCK FLUSH 100 UNIT/ML IV SOLN
250.0000 [IU] | INTRAVENOUS | Status: AC | PRN
  Administered 2014-05-22: 250 [IU]
  Filled 2014-05-22: qty 5

## 2014-05-22 MED ORDER — SODIUM CHLORIDE 0.9 % IJ SOLN
10.0000 mL | INTRAMUSCULAR | Status: AC | PRN
  Administered 2014-05-22: 10 mL
  Filled 2014-05-22: qty 10

## 2014-05-22 NOTE — Patient Instructions (Signed)
Tunneled Catheter Insertion, Care After °Refer to this sheet in the next few weeks. These instructions provide you with information on caring for yourself after your procedure. Your caregiver may also give you more specific instructions. Your treatment has been planned according to current medical practices, but problems sometimes occur. Call your caregiver if you have any problems or questions after your procedure.  °HOME CARE INSTRUCTIONS °· Rest at home the day of the procedure. You will likely be able to return to normal activities the following day. °· Follow your caregiver's specific instructions for the type of device that you have. °· Only take over-the-counter or prescription medicines as directed by your caregiver. °· Keep the insertion site of the catheter clean and dry at all times. °¨ Change the bandages (dressings) over the catheter site as directed by your caregiver. °¨ Wash the area around the catheter site during each dressing change. Sponge bathe the area using a germ-killing (antiseptic) solution as directed by your caregiver. °¨ Look for redness or swelling at the insertion site during each dressing change. °· Apply an antibiotic ointment as directed by your caregiver. °· Flush your catheter as directed to keep it from becoming clogged. °· Always wash your hands thoroughly before changing dressings or flushing the catheter. °· Do not let air enter the catheter. °¨ Never open the cap at the catheter tip. °¨ Always make sure there is no air in the syringe or in the tubing for infusions.    °· Do not lift anything heavy. °· Do not drive until your caregiver approves. °· Do not shower or bathe until your caregiver approves. When you shower or bathe, place a piece of plastic wrap over the catheter site. Do not allow the catheter site or the dressing to get wet. If taking a bath, do not allow the catheter to get submerged in the water. °If the catheter was inserted through an arm vein:  °· Avoid  wearing tight clothes or jewelry on the arm that has the catheter.   °· Do not sleep with your head on the arm that has the catheter.   °· Do not allow use of a blood pressure cuff on the arm that has the catheter.   °· Do not let anyone draw blood from the arm that has the catheter, except through the catheter itself. °SEEK MEDICAL CARE IF: °· You have bleeding at the insertion site of the catheter.   °· You feel weak or nauseous.   °· Your catheter is not working properly.   °· You have redness, pain, swelling, and warmth at the insertion site.   °· You notice fluid draining from the insertion site.   °SEEK IMMEDIATE MEDICAL CARE IF: °· Your catheter breaks or has a hole in it.   °· Your catheter comes loose or gets pulled completely out. If this happens, hold firm pressure over the area with your hand or a clean cloth.   °· You have a fever. °· You have chills.   °· Your catheter becomes totally blocked.   °· You have swelling in your arm, shoulder, neck, or face.   °· You have bleeding from the insertion site that does not stop.   °· You develop chest pain or have trouble breathing.   °· You feel dizzy or faint.   °MAKE SURE YOU: °· Understand these instructions. °· Will watch your condition. °· Will get help right away if you are not doing well or get worse. °Document Released: 01/03/2012 Document Revised: 09/18/2012 Document Reviewed: 01/03/2012 °ExitCare® Patient Information ©2015 [employer]. This information is not intended to replace advice   to you by your health care provider. Make sure you discuss any questions you have with your health care provider.  

## 2014-05-22 NOTE — Progress Notes (Signed)
Patient came in for lab work. Her husband will be administering neupogen today and through the weekend at home. They asked for me to observe the technique today to provide for greater confidence for this weekend. Injection technique including aseptic technique, site selection, handling of syringe, injection of medication and disposal all reviewed. Observed husband injecting neupogen times three doses - as ordered. Technique good. All questions answered. Husband comfortable with the weekend doses.

## 2014-05-25 ENCOUNTER — Ambulatory Visit (HOSPITAL_BASED_OUTPATIENT_CLINIC_OR_DEPARTMENT_OTHER): Payer: BC Managed Care – PPO

## 2014-05-25 ENCOUNTER — Other Ambulatory Visit (HOSPITAL_BASED_OUTPATIENT_CLINIC_OR_DEPARTMENT_OTHER): Payer: BC Managed Care – PPO

## 2014-05-25 VITALS — BP 109/56 | HR 101 | Temp 98.6°F | Resp 16 | Ht 63.0 in | Wt 192.0 lb

## 2014-05-25 DIAGNOSIS — C9000 Multiple myeloma not having achieved remission: Secondary | ICD-10-CM

## 2014-05-25 DIAGNOSIS — C9002 Multiple myeloma in relapse: Secondary | ICD-10-CM

## 2014-05-25 DIAGNOSIS — Z452 Encounter for adjustment and management of vascular access device: Secondary | ICD-10-CM | POA: Diagnosis not present

## 2014-05-25 LAB — CBC WITH DIFFERENTIAL (CANCER CENTER ONLY)
BASO#: 0 10*3/uL (ref 0.0–0.2)
BASO%: 1.4 % (ref 0.0–2.0)
EOS%: 2.9 % (ref 0.0–7.0)
Eosinophils Absolute: 0 10*3/uL (ref 0.0–0.5)
HCT: 30.9 % — ABNORMAL LOW (ref 34.8–46.6)
HGB: 10.3 g/dL — ABNORMAL LOW (ref 11.6–15.9)
LYMPH#: 0.2 10*3/uL — ABNORMAL LOW (ref 0.9–3.3)
LYMPH%: 21.4 % (ref 14.0–48.0)
MCH: 26 pg (ref 26.0–34.0)
MCHC: 33.3 g/dL (ref 32.0–36.0)
MCV: 78 fL — ABNORMAL LOW (ref 81–101)
MONO#: 0.1 10*3/uL (ref 0.1–0.9)
MONO%: 17.1 % — ABNORMAL HIGH (ref 0.0–13.0)
NEUT#: 0.4 10*3/uL — CL (ref 1.5–6.5)
NEUT%: 57.2 % (ref 39.6–80.0)
Platelets: 107 10*3/uL — ABNORMAL LOW (ref 145–400)
RBC: 3.96 10*6/uL (ref 3.70–5.32)
RDW: 15.9 % — ABNORMAL HIGH (ref 11.1–15.7)
WBC: 0.7 10*3/uL — CL (ref 3.9–10.0)

## 2014-05-25 LAB — CMP (CANCER CENTER ONLY)
ALT(SGPT): 19 U/L (ref 10–47)
AST: 12 U/L (ref 11–38)
Albumin: 3.6 g/dL (ref 3.3–5.5)
Alkaline Phosphatase: 44 U/L (ref 26–84)
BUN, Bld: 18 mg/dL (ref 7–22)
CO2: 32 mEq/L (ref 18–33)
Calcium: 9.5 mg/dL (ref 8.0–10.3)
Chloride: 99 mEq/L (ref 98–108)
Creat: 1.1 mg/dl (ref 0.6–1.2)
Glucose, Bld: 86 mg/dL (ref 73–118)
Potassium: 3.2 mEq/L — ABNORMAL LOW (ref 3.3–4.7)
Sodium: 140 mEq/L (ref 128–145)
Total Bilirubin: 0.7 mg/dl (ref 0.20–1.60)
Total Protein: 6.7 g/dL (ref 6.4–8.1)

## 2014-05-25 LAB — TECHNOLOGIST REVIEW CHCC SATELLITE

## 2014-05-25 MED ORDER — POTASSIUM CHLORIDE CRYS ER 20 MEQ PO TBCR
20.0000 meq | EXTENDED_RELEASE_TABLET | Freq: Every day | ORAL | Status: DC
Start: 2014-05-25 — End: 2014-06-01

## 2014-05-25 MED ORDER — HEPARIN SOD (PORK) LOCK FLUSH 100 UNIT/ML IV SOLN
250.0000 [IU] | INTRAVENOUS | Status: AC | PRN
  Administered 2014-05-25: 250 [IU]
  Filled 2014-05-25: qty 5

## 2014-05-25 MED ORDER — SODIUM CHLORIDE 0.9 % IJ SOLN
10.0000 mL | INTRAMUSCULAR | Status: AC | PRN
  Administered 2014-05-25: 10 mL
  Filled 2014-05-25: qty 10

## 2014-05-25 NOTE — Patient Instructions (Signed)
Tunneled Catheter Insertion, Care After °Refer to this sheet in the next few weeks. These instructions provide you with information on caring for yourself after your procedure. Your caregiver may also give you more specific instructions. Your treatment has been planned according to current medical practices, but problems sometimes occur. Call your caregiver if you have any problems or questions after your procedure.  °HOME CARE INSTRUCTIONS °· Rest at home the day of the procedure. You will likely be able to return to normal activities the following day. °· Follow your caregiver's specific instructions for the type of device that you have. °· Only take over-the-counter or prescription medicines as directed by your caregiver. °· Keep the insertion site of the catheter clean and dry at all times. °¨ Change the bandages (dressings) over the catheter site as directed by your caregiver. °¨ Wash the area around the catheter site during each dressing change. Sponge bathe the area using a germ-killing (antiseptic) solution as directed by your caregiver. °¨ Look for redness or swelling at the insertion site during each dressing change. °· Apply an antibiotic ointment as directed by your caregiver. °· Flush your catheter as directed to keep it from becoming clogged. °· Always wash your hands thoroughly before changing dressings or flushing the catheter. °· Do not let air enter the catheter. °¨ Never open the cap at the catheter tip. °¨ Always make sure there is no air in the syringe or in the tubing for infusions.    °· Do not lift anything heavy. °· Do not drive until your caregiver approves. °· Do not shower or bathe until your caregiver approves. When you shower or bathe, place a piece of plastic wrap over the catheter site. Do not allow the catheter site or the dressing to get wet. If taking a bath, do not allow the catheter to get submerged in the water. °If the catheter was inserted through an arm vein:  °· Avoid  wearing tight clothes or jewelry on the arm that has the catheter.   °· Do not sleep with your head on the arm that has the catheter.   °· Do not allow use of a blood pressure cuff on the arm that has the catheter.   °· Do not let anyone draw blood from the arm that has the catheter, except through the catheter itself. °SEEK MEDICAL CARE IF: °· You have bleeding at the insertion site of the catheter.   °· You feel weak or nauseous.   °· Your catheter is not working properly.   °· You have redness, pain, swelling, and warmth at the insertion site.   °· You notice fluid draining from the insertion site.   °SEEK IMMEDIATE MEDICAL CARE IF: °· Your catheter breaks or has a hole in it.   °· Your catheter comes loose or gets pulled completely out. If this happens, hold firm pressure over the area with your hand or a clean cloth.   °· You have a fever. °· You have chills.   °· Your catheter becomes totally blocked.   °· You have swelling in your arm, shoulder, neck, or face.   °· You have bleeding from the insertion site that does not stop.   °· You develop chest pain or have trouble breathing.   °· You feel dizzy or faint.   °MAKE SURE YOU: °· Understand these instructions. °· Will watch your condition. °· Will get help right away if you are not doing well or get worse. °Document Released: 01/03/2012 Document Revised: 09/18/2012 Document Reviewed: 01/03/2012 °ExitCare® Patient Information ©2015 [employer]. This information is not intended to replace advice   to you by your health care provider. Make sure you discuss any questions you have with your health care provider.  

## 2014-05-26 ENCOUNTER — Other Ambulatory Visit: Payer: Self-pay | Admitting: Hematology & Oncology

## 2014-05-27 ENCOUNTER — Other Ambulatory Visit: Payer: BC Managed Care – PPO

## 2014-05-27 ENCOUNTER — Ambulatory Visit: Payer: BC Managed Care – PPO

## 2014-05-27 ENCOUNTER — Other Ambulatory Visit (HOSPITAL_BASED_OUTPATIENT_CLINIC_OR_DEPARTMENT_OTHER): Payer: BC Managed Care – PPO

## 2014-05-27 VITALS — BP 115/67 | HR 97 | Temp 98.3°F | Resp 20

## 2014-05-27 DIAGNOSIS — Z8579 Personal history of other malignant neoplasms of lymphoid, hematopoietic and related tissues: Secondary | ICD-10-CM

## 2014-05-27 DIAGNOSIS — C9000 Multiple myeloma not having achieved remission: Secondary | ICD-10-CM

## 2014-05-27 LAB — CBC WITH DIFFERENTIAL (CANCER CENTER ONLY)
HCT: 30.6 % — ABNORMAL LOW (ref 34.8–46.6)
HGB: 10.3 g/dL — ABNORMAL LOW (ref 11.6–15.9)
MCH: 25.8 pg — ABNORMAL LOW (ref 26.0–34.0)
MCHC: 33.7 g/dL (ref 32.0–36.0)
MCV: 77 fL — ABNORMAL LOW (ref 81–101)
Platelets: 67 10*3/uL — ABNORMAL LOW (ref 145–400)
RBC: 3.99 10*6/uL (ref 3.70–5.32)
RDW: 15.3 % (ref 11.1–15.7)
WBC: 0.4 10*3/uL — CL (ref 3.9–10.0)

## 2014-05-27 LAB — CMP (CANCER CENTER ONLY)
ALT(SGPT): 21 U/L (ref 10–47)
AST: 15 U/L (ref 11–38)
Albumin: 3.9 g/dL (ref 3.3–5.5)
Alkaline Phosphatase: 48 U/L (ref 26–84)
BUN, Bld: 16 mg/dL (ref 7–22)
CO2: 29 mEq/L (ref 18–33)
Calcium: 9.4 mg/dL (ref 8.0–10.3)
Chloride: 99 mEq/L (ref 98–108)
Creat: 1.1 mg/dl (ref 0.6–1.2)
Glucose, Bld: 93 mg/dL (ref 73–118)
Potassium: 3.2 mEq/L — ABNORMAL LOW (ref 3.3–4.7)
Sodium: 140 mEq/L (ref 128–145)
Total Bilirubin: 0.7 mg/dl (ref 0.20–1.60)
Total Protein: 7 g/dL (ref 6.4–8.1)

## 2014-05-27 LAB — TECHNOLOGIST REVIEW CHCC SATELLITE

## 2014-05-27 MED ORDER — HEPARIN SOD (PORK) LOCK FLUSH 100 UNIT/ML IV SOLN
500.0000 [IU] | INTRAVENOUS | Status: DC | PRN
  Filled 2014-05-27: qty 5

## 2014-05-27 MED ORDER — SODIUM CHLORIDE 0.9 % IJ SOLN
10.0000 mL | INTRAMUSCULAR | Status: DC | PRN
  Filled 2014-05-27: qty 10

## 2014-05-27 MED ORDER — SODIUM CHLORIDE 0.9 % IJ SOLN
10.0000 mL | INTRAMUSCULAR | Status: AC | PRN
  Administered 2014-05-27: 10 mL
  Filled 2014-05-27: qty 10

## 2014-05-27 MED ORDER — ALTEPLASE 2 MG IJ SOLR
2.0000 mg | Freq: Once | INTRAMUSCULAR | Status: DC | PRN
  Filled 2014-05-27: qty 2

## 2014-05-27 MED ORDER — HEPARIN SOD (PORK) LOCK FLUSH 100 UNIT/ML IV SOLN
250.0000 [IU] | INTRAVENOUS | Status: AC | PRN
  Administered 2014-05-27: 250 [IU]
  Filled 2014-05-27: qty 5

## 2014-05-27 NOTE — Patient Instructions (Signed)
Tunneled Catheter Insertion, Care After °Refer to this sheet in the next few weeks. These instructions provide you with information on caring for yourself after your procedure. Your caregiver may also give you more specific instructions. Your treatment has been planned according to current medical practices, but problems sometimes occur. Call your caregiver if you have any problems or questions after your procedure.  °HOME CARE INSTRUCTIONS °· Rest at home the day of the procedure. You will likely be able to return to normal activities the following day. °· Follow your caregiver's specific instructions for the type of device that you have. °· Only take over-the-counter or prescription medicines as directed by your caregiver. °· Keep the insertion site of the catheter clean and dry at all times. °¨ Change the bandages (dressings) over the catheter site as directed by your caregiver. °¨ Wash the area around the catheter site during each dressing change. Sponge bathe the area using a germ-killing (antiseptic) solution as directed by your caregiver. °¨ Look for redness or swelling at the insertion site during each dressing change. °· Apply an antibiotic ointment as directed by your caregiver. °· Flush your catheter as directed to keep it from becoming clogged. °· Always wash your hands thoroughly before changing dressings or flushing the catheter. °· Do not let air enter the catheter. °¨ Never open the cap at the catheter tip. °¨ Always make sure there is no air in the syringe or in the tubing for infusions.    °· Do not lift anything heavy. °· Do not drive until your caregiver approves. °· Do not shower or bathe until your caregiver approves. When you shower or bathe, place a piece of plastic wrap over the catheter site. Do not allow the catheter site or the dressing to get wet. If taking a bath, do not allow the catheter to get submerged in the water. °If the catheter was inserted through an arm vein:  °· Avoid  wearing tight clothes or jewelry on the arm that has the catheter.   °· Do not sleep with your head on the arm that has the catheter.   °· Do not allow use of a blood pressure cuff on the arm that has the catheter.   °· Do not let anyone draw blood from the arm that has the catheter, except through the catheter itself. °SEEK MEDICAL CARE IF: °· You have bleeding at the insertion site of the catheter.   °· You feel weak or nauseous.   °· Your catheter is not working properly.   °· You have redness, pain, swelling, and warmth at the insertion site.   °· You notice fluid draining from the insertion site.   °SEEK IMMEDIATE MEDICAL CARE IF: °· Your catheter breaks or has a hole in it.   °· Your catheter comes loose or gets pulled completely out. If this happens, hold firm pressure over the area with your hand or a clean cloth.   °· You have a fever. °· You have chills.   °· Your catheter becomes totally blocked.   °· You have swelling in your arm, shoulder, neck, or face.   °· You have bleeding from the insertion site that does not stop.   °· You develop chest pain or have trouble breathing.   °· You feel dizzy or faint.   °MAKE SURE YOU: °· Understand these instructions. °· Will watch your condition. °· Will get help right away if you are not doing well or get worse. °Document Released: 01/03/2012 Document Revised: 09/18/2012 Document Reviewed: 01/03/2012 °ExitCare® Patient Information ©2015 [employer]. This information is not intended to replace advice   to you by your health care provider. Make sure you discuss any questions you have with your health care provider.  

## 2014-05-29 ENCOUNTER — Ambulatory Visit (HOSPITAL_BASED_OUTPATIENT_CLINIC_OR_DEPARTMENT_OTHER): Payer: BC Managed Care – PPO

## 2014-05-29 ENCOUNTER — Other Ambulatory Visit (HOSPITAL_BASED_OUTPATIENT_CLINIC_OR_DEPARTMENT_OTHER): Payer: BC Managed Care – PPO

## 2014-05-29 VITALS — BP 102/64 | HR 88 | Temp 98.1°F | Resp 18

## 2014-05-29 DIAGNOSIS — C9000 Multiple myeloma not having achieved remission: Secondary | ICD-10-CM

## 2014-05-29 DIAGNOSIS — Z452 Encounter for adjustment and management of vascular access device: Secondary | ICD-10-CM | POA: Diagnosis not present

## 2014-05-29 DIAGNOSIS — C9002 Multiple myeloma in relapse: Secondary | ICD-10-CM

## 2014-05-29 LAB — MANUAL DIFFERENTIAL (CHCC SATELLITE)
ALC: 0.4 10*3/uL — ABNORMAL LOW (ref 0.6–2.2)
ANC (CHCC HP manual diff): 1.9 10*3/uL (ref 1.5–6.7)
BASO: 2 % (ref 0–2)
Band Neutrophils: 8 % (ref 0–10)
Eos: 2 % (ref 0–7)
LYMPH: 13 % — ABNORMAL LOW (ref 14–48)
MONO: 28 % — ABNORMAL HIGH (ref 0–13)
Metamyelocytes: 1 % — ABNORMAL HIGH (ref 0–0)
Myelocytes: 2 % — ABNORMAL HIGH (ref 0–0)
PLT EST ~~LOC~~: DECREASED
Platelet Morphology: NORMAL
SEG: 44 % (ref 40–75)
nRBC: 1 % — ABNORMAL HIGH (ref 0–0)

## 2014-05-29 LAB — CBC WITH DIFFERENTIAL (CANCER CENTER ONLY)
HCT: 29.4 % — ABNORMAL LOW (ref 34.8–46.6)
HGB: 9.8 g/dL — ABNORMAL LOW (ref 11.6–15.9)
MCH: 25.9 pg — ABNORMAL LOW (ref 26.0–34.0)
MCHC: 33.3 g/dL (ref 32.0–36.0)
MCV: 78 fL — ABNORMAL LOW (ref 81–101)
Platelets: 103 10*3/uL — ABNORMAL LOW (ref 145–400)
RBC: 3.79 10*6/uL (ref 3.70–5.32)
RDW: 15.6 % (ref 11.1–15.7)
WBC: 3.4 10*3/uL — ABNORMAL LOW (ref 3.9–10.0)

## 2014-05-29 LAB — CMP (CANCER CENTER ONLY)
ALT(SGPT): 20 U/L (ref 10–47)
AST: 20 U/L (ref 11–38)
Albumin: 3.7 g/dL (ref 3.3–5.5)
Alkaline Phosphatase: 54 U/L (ref 26–84)
BUN, Bld: 21 mg/dL (ref 7–22)
CO2: 25 mEq/L (ref 18–33)
Calcium: 9.2 mg/dL (ref 8.0–10.3)
Chloride: 103 mEq/L (ref 98–108)
Creat: 1.4 mg/dl — ABNORMAL HIGH (ref 0.6–1.2)
Glucose, Bld: 88 mg/dL (ref 73–118)
Potassium: 3.1 mEq/L — ABNORMAL LOW (ref 3.3–4.7)
Sodium: 140 mEq/L (ref 128–145)
Total Bilirubin: 0.5 mg/dl (ref 0.20–1.60)
Total Protein: 6.6 g/dL (ref 6.4–8.1)

## 2014-05-29 MED ORDER — SODIUM CHLORIDE 0.9 % IJ SOLN
10.0000 mL | INTRAMUSCULAR | Status: DC | PRN
  Filled 2014-05-29: qty 10

## 2014-05-29 MED ORDER — SODIUM CHLORIDE 0.9 % IJ SOLN
10.0000 mL | INTRAMUSCULAR | Status: AC | PRN
  Administered 2014-05-29: 10 mL
  Filled 2014-05-29: qty 10

## 2014-05-29 MED ORDER — HEPARIN SOD (PORK) LOCK FLUSH 100 UNIT/ML IV SOLN
500.0000 [IU] | INTRAVENOUS | Status: DC | PRN
  Filled 2014-05-29: qty 5

## 2014-05-29 MED ORDER — HEPARIN SOD (PORK) LOCK FLUSH 100 UNIT/ML IV SOLN
250.0000 [IU] | INTRAVENOUS | Status: AC | PRN
  Administered 2014-05-29: 250 [IU]
  Filled 2014-05-29: qty 5

## 2014-05-31 ENCOUNTER — Encounter (HOSPITAL_COMMUNITY): Payer: Self-pay | Admitting: Emergency Medicine

## 2014-05-31 ENCOUNTER — Inpatient Hospital Stay (HOSPITAL_COMMUNITY)
Admission: EM | Admit: 2014-05-31 | Discharge: 2014-06-01 | DRG: 683 | Disposition: A | Discharge status: Home / Self Care | Payer: BC Managed Care – PPO | Attending: Internal Medicine | Admitting: Internal Medicine

## 2014-05-31 ENCOUNTER — Emergency Department (HOSPITAL_COMMUNITY): Payer: BC Managed Care – PPO

## 2014-05-31 DIAGNOSIS — D696 Thrombocytopenia, unspecified: Secondary | ICD-10-CM | POA: Diagnosis present

## 2014-05-31 DIAGNOSIS — N179 Acute kidney failure, unspecified: Secondary | ICD-10-CM | POA: Diagnosis not present

## 2014-05-31 DIAGNOSIS — E876 Hypokalemia: Secondary | ICD-10-CM | POA: Diagnosis present

## 2014-05-31 DIAGNOSIS — C9000 Multiple myeloma not having achieved remission: Secondary | ICD-10-CM

## 2014-05-31 DIAGNOSIS — Z8589 Personal history of malignant neoplasm of other organs and systems: Secondary | ICD-10-CM | POA: Diagnosis not present

## 2014-05-31 DIAGNOSIS — R739 Hyperglycemia, unspecified: Secondary | ICD-10-CM | POA: Diagnosis present

## 2014-05-31 DIAGNOSIS — T451X5A Adverse effect of antineoplastic and immunosuppressive drugs, initial encounter: Secondary | ICD-10-CM | POA: Diagnosis present

## 2014-05-31 DIAGNOSIS — R000 Tachycardia, unspecified: Secondary | ICD-10-CM | POA: Diagnosis present

## 2014-05-31 DIAGNOSIS — Z885 Allergy status to narcotic agent status: Secondary | ICD-10-CM | POA: Diagnosis not present

## 2014-05-31 DIAGNOSIS — E860 Dehydration: Secondary | ICD-10-CM | POA: Diagnosis present

## 2014-05-31 DIAGNOSIS — R509 Fever, unspecified: Secondary | ICD-10-CM | POA: Diagnosis not present

## 2014-05-31 DIAGNOSIS — D61818 Other pancytopenia: Secondary | ICD-10-CM | POA: Diagnosis present

## 2014-05-31 DIAGNOSIS — D72829 Elevated white blood cell count, unspecified: Secondary | ICD-10-CM | POA: Diagnosis present

## 2014-05-31 DIAGNOSIS — D6481 Anemia due to antineoplastic chemotherapy: Secondary | ICD-10-CM | POA: Diagnosis not present

## 2014-05-31 DIAGNOSIS — Z90710 Acquired absence of both cervix and uterus: Secondary | ICD-10-CM

## 2014-05-31 DIAGNOSIS — Z90722 Acquired absence of ovaries, bilateral: Secondary | ICD-10-CM | POA: Diagnosis present

## 2014-05-31 DIAGNOSIS — C9002 Multiple myeloma in relapse: Secondary | ICD-10-CM | POA: Diagnosis present

## 2014-05-31 DIAGNOSIS — I10 Essential (primary) hypertension: Secondary | ICD-10-CM | POA: Diagnosis present

## 2014-05-31 DIAGNOSIS — N19 Unspecified kidney failure: Secondary | ICD-10-CM | POA: Diagnosis not present

## 2014-05-31 DIAGNOSIS — E872 Acidosis: Secondary | ICD-10-CM | POA: Diagnosis present

## 2014-05-31 DIAGNOSIS — D649 Anemia, unspecified: Secondary | ICD-10-CM | POA: Diagnosis present

## 2014-05-31 DIAGNOSIS — Z923 Personal history of irradiation: Secondary | ICD-10-CM | POA: Diagnosis not present

## 2014-05-31 LAB — CBC WITH DIFFERENTIAL/PLATELET
Basophils Absolute: 0 10*3/uL (ref 0.0–0.1)
Basophils Relative: 0 % (ref 0–1)
Eosinophils Absolute: 0 10*3/uL (ref 0.0–0.7)
Eosinophils Relative: 0 % (ref 0–5)
HCT: 29.3 % — ABNORMAL LOW (ref 36.0–46.0)
Hemoglobin: 9.7 g/dL — ABNORMAL LOW (ref 12.0–15.0)
Lymphocytes Relative: 1 % — ABNORMAL LOW (ref 12–46)
Lymphs Abs: 0.4 10*3/uL — ABNORMAL LOW (ref 0.7–4.0)
MCH: 25.4 pg — ABNORMAL LOW (ref 26.0–34.0)
MCHC: 33.1 g/dL (ref 30.0–36.0)
MCV: 76.7 fL — ABNORMAL LOW (ref 78.0–100.0)
Monocytes Absolute: 2.4 10*3/uL — ABNORMAL HIGH (ref 0.1–1.0)
Monocytes Relative: 6 % (ref 3–12)
Neutro Abs: 36.7 10*3/uL — ABNORMAL HIGH (ref 1.7–7.7)
Neutrophils Relative %: 93 % — ABNORMAL HIGH (ref 43–77)
Platelets: 154 10*3/uL (ref 150–400)
RBC: 3.82 MIL/uL — ABNORMAL LOW (ref 3.87–5.11)
RDW: 16.5 % — ABNORMAL HIGH (ref 11.5–15.5)
WBC: 39.5 10*3/uL — ABNORMAL HIGH (ref 4.0–10.5)

## 2014-05-31 LAB — COMPREHENSIVE METABOLIC PANEL
ALT: 18 U/L (ref 14–54)
AST: 19 U/L (ref 15–41)
Albumin: 4.1 g/dL (ref 3.5–5.0)
Alkaline Phosphatase: 130 U/L — ABNORMAL HIGH (ref 38–126)
Anion gap: 8 (ref 5–15)
BUN: 43 mg/dL — ABNORMAL HIGH (ref 6–20)
CO2: 20 mmol/L — ABNORMAL LOW (ref 22–32)
Calcium: 9.7 mg/dL (ref 8.9–10.3)
Chloride: 109 mmol/L (ref 101–111)
Creatinine, Ser: 3.9 mg/dL — ABNORMAL HIGH (ref 0.44–1.00)
GFR calc Af Amer: 13 mL/min — ABNORMAL LOW (ref >60)
GFR calc non Af Amer: 11 mL/min — ABNORMAL LOW (ref >60)
Glucose, Bld: 137 mg/dL — ABNORMAL HIGH (ref 70–99)
Potassium: 3.1 mmol/L — ABNORMAL LOW (ref 3.5–5.1)
Sodium: 137 mmol/L (ref 135–145)
Total Bilirubin: 0.5 mg/dL (ref 0.3–1.2)
Total Protein: 7.2 g/dL (ref 6.5–8.1)

## 2014-05-31 LAB — URINALYSIS, ROUTINE W REFLEX MICROSCOPIC
Bilirubin Urine: NEGATIVE
Glucose, UA: NEGATIVE mg/dL
Hgb urine dipstick: NEGATIVE
Ketones, ur: NEGATIVE mg/dL
Leukocytes, UA: NEGATIVE
Nitrite: NEGATIVE
Protein, ur: NEGATIVE mg/dL
Specific Gravity, Urine: 1.011 (ref 1.005–1.030)
Urobilinogen, UA: 0.2 mg/dL (ref 0.0–1.0)
pH: 5 (ref 5.0–8.0)

## 2014-05-31 LAB — I-STAT CG4 LACTIC ACID, ED: Lactic Acid, Venous: 2.87 mmol/L (ref 0.5–2.0)

## 2014-05-31 MED ORDER — SODIUM CHLORIDE 0.9 % IJ SOLN
10.0000 mL | INTRAMUSCULAR | Status: DC | PRN
  Administered 2014-05-31: 30 mL
  Filled 2014-05-31: qty 40

## 2014-05-31 MED ORDER — HYDRALAZINE HCL 20 MG/ML IJ SOLN: 10.0000 mg | Freq: Four times a day (QID) | INTRAMUSCULAR | Status: DC | PRN

## 2014-05-31 MED ORDER — SODIUM CHLORIDE 0.9 % IV SOLN
INTRAVENOUS | Status: DC
  Administered 2014-06-01: 01:00:00 via INTRAVENOUS

## 2014-05-31 MED ORDER — VANCOMYCIN HCL IN DEXTROSE 1-5 GM/200ML-% IV SOLN
1000.0000 mg | INTRAVENOUS | Status: AC
  Administered 2014-05-31: 1000 mg via INTRAVENOUS
  Filled 2014-05-31: qty 200

## 2014-05-31 MED ORDER — SODIUM CHLORIDE 0.9 % IV BOLUS (SEPSIS)
1000.0000 mL | INTRAVENOUS | Status: AC
  Administered 2014-05-31 (×2): 1000 mL via INTRAVENOUS

## 2014-05-31 MED ORDER — ACETAMINOPHEN 325 MG PO TABS
650.0000 mg | ORAL_TABLET | Freq: Once | ORAL | Status: AC
  Administered 2014-05-31: 650 mg via ORAL
  Filled 2014-05-31: qty 2

## 2014-05-31 MED ORDER — PIPERACILLIN-TAZOBACTAM 3.375 G IVPB 30 MIN
3.3750 g | INTRAVENOUS | Status: AC
  Administered 2014-05-31: 3.375 g via INTRAVENOUS
  Filled 2014-05-31: qty 50

## 2014-05-31 MED ORDER — PIPERACILLIN-TAZOBACTAM IN DEX 2-0.25 GM/50ML IV SOLN
2.2500 g | Freq: Four times a day (QID) | INTRAVENOUS | Status: DC
  Administered 2014-06-01 (×3): 2.25 g via INTRAVENOUS
  Filled 2014-05-31 (×6): qty 50

## 2014-05-31 MED ORDER — VANCOMYCIN HCL IN DEXTROSE 1-5 GM/200ML-% IV SOLN: 1000.0000 mg | INTRAVENOUS | Status: DC

## 2014-05-31 NOTE — ED Notes (Addendum)
Pt from home c/o fever x few hours. She is being treated for multiple myeloma. She reports she did not take any OTC meds for fever because she was told not take any any other meds than her prescribed meds. Pt denies pain. She is suppose to have a stem cell transplant soon. Pt has right sided chest port.

## 2014-05-31 NOTE — ED Provider Notes (Signed)
Patient with fever noted today. Otherwise asymptomatic. Denies cough denies headache denies abdominal pain denies shortness of breath no other associated symptoms. Patient with history of multiple myeloma. Currently receiving chemotherapy on exam no distress lungs clear auscultation heart regular rate and rhythm no murmurs chest with subclavian catheter right side without redness or tenderness at site  Orlie Dakin, MD 05/31/14 2207

## 2014-05-31 NOTE — Progress Notes (Addendum)
ANTIBIOTIC CONSULT NOTE - INITIAL  Pharmacy Consult for vancomycin, Zosyn Indication: rule out sepsis  Allergies  Allergen Reactions  . Codeine Nausea Only    Patient Measurements:     Vital Signs: Temp: 100.5 F (38.1 C) (05/01 1940) Temp Source: Oral (05/01 1940) BP: 113/64 mmHg (05/01 1940) Pulse Rate: 115 (05/01 1940) Intake/Output from previous day:   Intake/Output from this shift:    Labs:  Recent Labs  05/29/14 1038 05/29/14 1039  WBC 3.4*  --   HGB 9.8*  --   PLT 103*  --   CREATININE  --  1.4*   Estimated Creatinine Clearance: 43.6 mL/min (by C-G formula based on Cr of 1.4). No results for input(s): VANCOTROUGH, VANCOPEAK, VANCORANDOM, GENTTROUGH, GENTPEAK, GENTRANDOM, TOBRATROUGH, TOBRAPEAK, TOBRARND, AMIKACINPEAK, AMIKACINTROU, AMIKACIN in the last 72 hours.   Microbiology: No results found for this or any previous visit (from the past 720 hour(s)).  Medical History: Past Medical History  Diagnosis Date  . Lambda light chain myeloma 11/11/2007  . Hypertension 11/28/97  . H/O multiple myeloma 11/28/1997  . Staphylococcus aureus bacteremia 11/28/1997  . Staphylococcus epidermidis bacteremia 11/28/97  . Pregnancy induced hypertension   . Osteoporosis 12/18/05    Increased  risk   . Increased BMI 12/18/05  . Post-menopausal bleeding 12/19/06  . Vaginal atrophy 12/19/06  . Elevated hemoglobin A1c     Borderline  . Fibroid 07/26/10    asymptomatic  . Vaginal atrophy   . PONV (postoperative nausea and vomiting)     nausea in past, none recent  . Endometrial carcinoma 05/28/12  . History of radiation therapy 6/4, 6/11, 6/19, 6/25, 07/29/2012    vagina 30.5 gray in 5 fx, HDR brachytherapy    Medications:  Scheduled:   Infusions:  . sodium chloride     Assessment: 64 yo female presented to ER with fever. Patient with hx multiple myeloma doing well on pomalidomide with Velcade going to [facility] soon in preparation for transplant. To start vancomycin  and Zosyn for rule out sepsis  5/1 >> vanc >> 5/1 >> Zosyn >>  Afebrile CBC and BMET needs to be collected  Goal of Therapy:  Vancomycin trough level 15-20 mcg/ml  Plan:  1) Vancomycin 1g IV x 1 2) Zosyn 3.375g IV x 1 3) Will await lab results in order to further dose antibiotics   Adrian Saran, PharmD, BCPS Pager [phone] 05/31/2014 8:07 PM   Plan:  Scr=3.90>17 (N)  Vancomycin 1Gm IV q48h  Zosyn 2.25Gm IV q6h  F/u Scr/cultures/levels  Dorrene German 05/31/2014 11:49 PM

## 2014-05-31 NOTE — ED Notes (Signed)
Delay in giving pt medications and second blood culture d/t waiting for IV team to come access pt's chest dialysis catheter.

## 2014-05-31 NOTE — ED Provider Notes (Signed)
CSN: [id number]     Arrival date & time 05/31/14  1934 History   First MD Initiated Contact with Patient 05/31/14 1953     Chief Complaint  Patient presents with  . Fever  . CA patient      (Consider location/radiation/quality/duration/timing/severity/associated sxs/prior Treatment) Patient is a 64 y.o. female presenting with fever. The history is provided by the patient and medical records.  Fever   64 year old female with past medical history significant for hypertension, multiple myeloma, presenting to the ED for fever. She states up until today she has been feeling well and noticed this evening that she felt warm and was running a fever. She did have some brief nausea but denies vomiting.  She denies any cough, nasal congestion, or other upper respiratory symptoms. She denies any chest pain or shortness of breath. No abdominal pain or urinary symptoms. Patient is followed by oncology, Dr. Marin Olp.  States she is due for a stem cell transplant soon, they were supposed to gather her stem cells tomorrow for this and transplant later this week at [facility].  Past Medical History  Diagnosis Date  . Lambda light chain myeloma 11/11/2007  . Hypertension 11/28/97  . H/O multiple myeloma 11/28/1997  . Staphylococcus aureus bacteremia 11/28/1997  . Staphylococcus epidermidis bacteremia 11/28/97  . Pregnancy induced hypertension   . Osteoporosis 12/18/05    Increased  risk   . Increased BMI 12/18/05  . Post-menopausal bleeding 12/19/06  . Vaginal atrophy 12/19/06  . Elevated hemoglobin A1c     Borderline  . Fibroid 07/26/10    asymptomatic  . Vaginal atrophy   . PONV (postoperative nausea and vomiting)     nausea in past, none recent  . Endometrial carcinoma 05/28/12  . History of radiation therapy 6/4, 6/11, 6/19, 6/25, 07/29/2012    vagina 30.5 gray in 5 fx, HDR brachytherapy   Past Surgical History  Procedure Laterality Date  . Tubal ligation  1986  . Ectopic pregnancy surgery  1992  .  Hysteroscopy w/d&c  09/27/2011    Procedure: DILATATION AND CURETTAGE /HYSTEROSCOPY;  Surgeon: Eldred Manges, MD;  Location: [facility];  Service: Gynecology;;  . Laparotomy  10/31/2011    Procedure: EXPLORATORY LAPAROTOMY;  Surgeon: [name], MD PHD;  Location: [facility];  Service: Gynecology;  Laterality: N/A;  . Abdominal hysterectomy  10/31/2011    Procedure: HYSTERECTOMY ABDOMINAL;  Surgeon: [name], MD PHD;  Location: [facility];  Service: Gynecology;  Laterality: N/A;  . Salpingoophorectomy  10/31/2011    Procedure: SALPINGO OOPHERECTOMY;  Surgeon: [name], MD PHD;  Location: [facility];  Service: Gynecology;  Laterality: Bilateral;   Lymph Nodes sampling   Family History  Problem Relation Age of Onset  . Colon cancer Mother    History  Substance Use Topics  . Smoking status: Never Smoker   . Smokeless tobacco: Never Used     Comment: never smoked  . Alcohol Use: No   OB History    Gravida Para Term Preterm AB TAB SAB Ectopic Multiple Living   '3 2        2     '$ Review of Systems  Constitutional: Positive for fever.  All other systems reviewed and are negative.     Allergies  Codeine  Home Medications   Prior to Admission medications   Medication Sig Start Date End Date Taking? Authorizing Provider  acyclovir (ZOVIRAX) 400 MG tablet Take 1 tablet (400 mg total) by mouth 2 (two) times daily. 11/07/13  Yes [name], MD  Cholecalciferol (VITAMIN D3) 2000 UNITS TABS Take 1 tablet by mouth daily.   Yes Historical Provider, MD  ciprofloxacin (CIPRO) 500 MG tablet Take 500 mg by mouth 2 (two) times daily.  05/19/14  Yes Historical Provider, MD  fluticasone (FLOVENT HFA) 220 MCG/ACT inhaler Inhale 1 puff into the lungs 2 (two) times daily.  04/30/14 05/31/14 Yes Historical Provider, MD  furosemide (LASIX) 20 MG tablet TAKE 1 TABLET BY MOUTH EVERY DAY AS NEEDED FOR SWELLING OF ARMS/LEGS Patient taking differently: take 1 tablet daily 12/31/13  Yes [name], MD   loratadine (CLARITIN) 10 MG tablet Take 10 mg by mouth every morning.    Yes Historical Provider, MD  LORazepam (ATIVAN) 0.5 MG tablet Take 1 tablet (0.5 mg total) by mouth every 6 (six) hours as needed (Nausea or vomiting). 11/05/13  Yes [name], MD  metoprolol (TOPROL-XL) 50 MG 24 hr tablet Take 50 mg by mouth every morning.    Yes Historical Provider, MD  Olmesartan-Amlodipine-HCTZ 20-5-12.5 MG TABS Take 1 tablet by mouth every morning.    Yes Historical Provider, MD  ondansetron (ZOFRAN) 8 MG tablet Take 8 mg by mouth every 8 (eight) hours as needed.  11/05/13  Yes Historical Provider, MD  oxyCODONE (OXY IR/ROXICODONE) 5 MG immediate release tablet Take by mouth. 05/19/14  Yes Historical Provider, MD  potassium chloride SA (K-DUR,KLOR-CON) 20 MEQ tablet Take 1 tablet (20 mEq total) by mouth daily. 05/25/14  Yes [name], MD  prochlorperazine (COMPAZINE) 10 MG tablet Take 1 tablet (10 mg total) by mouth every 6 (six) hours as needed (Nausea or vomiting). 11/07/13  Yes [name], MD  NEUPOGEN 300 MCG/0.5ML SOSY injection Inject 900 mcg as directed daily. For 10 days 05/08/14   Historical Provider, MD  pomalidomide (POMALYST) 4 MG capsule Take 1 capsule (4 mg total) by mouth daily. Take with water on days 1-21. Repeat every 28 days. Authorization Number: [id number] Patient not taking: Reported on 05/31/2014 05/14/14   [name], MD   BP 113/64 mmHg  Pulse 115  Temp(Src) 100.5 F (38.1 C) (Oral)  Resp 20  SpO2 98%   Physical Exam  Constitutional: She is oriented to person, place, and time. She appears well-developed and well-nourished. No distress.  HENT:  Head: Normocephalic and atraumatic.  Mouth/Throat: Oropharynx is clear and moist.  Mildly dry mucous membranes  Eyes: Conjunctivae and EOM are normal. Pupils are equal, round, and reactive to light.  Neck: Normal range of motion. Neck supple.  Cardiovascular: Normal rate, regular rhythm and normal heart sounds.    Pulmonary/Chest: Effort normal and breath sounds normal. No respiratory distress. She has no wheezes.  Catheter right chest wall; clean without signs of infection  Abdominal: Soft. Bowel sounds are normal. There is no tenderness. There is no guarding.  Musculoskeletal: Normal range of motion. She exhibits no edema.  Neurological: She is alert and oriented to person, place, and time.  Skin: Skin is warm and dry. She is not diaphoretic.  Psychiatric: She has a normal mood and affect.  Nursing note and vitals reviewed.   ED Course  Procedures (including critical care time) Labs Review Labs Reviewed  CBC WITH DIFFERENTIAL/PLATELET - Abnormal; Notable for the following:    WBC 39.5 (*)    RBC 3.82 (*)    Hemoglobin 9.7 (*)    HCT 29.3 (*)    MCV 76.7 (*)    MCH 25.4 (*)  RDW 16.5 (*)    Neutrophils Relative % 93 (*)    Lymphocytes Relative 1 (*)    Neutro Abs 36.7 (*)    Lymphs Abs 0.4 (*)    Monocytes Absolute 2.4 (*)    All other components within normal limits  COMPREHENSIVE METABOLIC PANEL - Abnormal; Notable for the following:    Potassium 3.1 (*)    CO2 20 (*)    Glucose, Bld 137 (*)    BUN 43 (*)    Creatinine, Ser 3.90 (*)    Alkaline Phosphatase 130 (*)    GFR calc non Af Amer 11 (*)    GFR calc Af Amer 13 (*)    All other components within normal limits  URINALYSIS, ROUTINE W REFLEX MICROSCOPIC - Abnormal; Notable for the following:    APPearance CLOUDY (*)    All other components within normal limits  I-STAT CG4 LACTIC ACID, ED - Abnormal; Notable for the following:    Lactic Acid, Venous 2.87 (*)    All other components within normal limits  URINE CULTURE  CULTURE, BLOOD (ROUTINE X 2)  CULTURE, BLOOD (ROUTINE X 2)    Imaging Review Dg Chest 2 View  05/31/2014   CLINICAL DATA:  Stem cell transplant for multiple myeloma.  EXAM: CHEST  2 VIEW  COMPARISON:  Radiograph 03/25/2014  FINDINGS: Right central venous line with split catheter in the mid SVC. Mild  central venous pulmonary congestion. No pneumothorax or pleural fluid. No acute osseous abnormality.  IMPRESSION: Central venous pulmonary congestion.   Electronically Signed   By: Suzy Bouchard M.D.   On: 05/31/2014 20:38     EKG Interpretation None      MDM   Final diagnoses:  Fever, unspecified fever cause  Renal failure  Multiple myeloma   64 year old female currently undergoing treatment for multiple myeloma, here with fever. Temp 100.5 on arrival and patient is tachycardic to 115.  She does appear somewhat dry clinically.  She denies current cough, congestion, abdominal pain, etc.  Code sepsis activated on arrival, broad spectrum abx ordered.  Work-up pending.  Patient given weight based fluid bolus.  Lab work as above-- WBC count 35K, patient is on neupogen.  Lactic acid 2.87.  SrCr today is 3.90, over double what it was 2 days ago (1.4 at that time) consistent with renal failure.  CXR with pulmonary vascular congestion.  U/a non-infectious.  Patient has been given IV fluids and broad spectrum vanc and zosyn.  Her HR has responded well to fluids, BP somewhat soft.  Patient will be admitted to medicine service, Dr. Maudie Mercury to admit to step-down.  Temp admission orders were placed.  Patient's oncologist, Dr. Marin Olp, aware of admission and will see patient in the morning for full consultation.  Larene Pickett, PA-C 05/31/14 0539  Orlie Dakin, MD 06/01/14 [phone]

## 2014-05-31 NOTE — ED Notes (Signed)
Second set of blood cultures collected prior to start of antibiotics.

## 2014-05-31 NOTE — H&P (Signed)
Emily Greer is an 64 y.o. female.    Peter Ennever Jeanann Lewandowsky (pcp)  Chief Complaint: fever HPI: 64 yo female with multiple mylema, apparently had fever this evening.  Pt was brought to ED and found to have fever 100.5 and ARF  , creatinine 3.9  Pt denies cough, cp, palp, sob, n/v, diarrhea, dysuria.  No recent medication changes.  Pt apparently had chemo about 2 weeks ago.  Last creatinine was on 05/29/2014 1.4    Pt denies nsaid use. Pt will be admitted for w/up of fever, and also ARF.     Past Medical History  Diagnosis Date  . Lambda light chain myeloma 11/11/2007  . Hypertension 11/28/97  . H/O multiple myeloma 11/28/1997  . Staphylococcus aureus bacteremia 11/28/1997  . Staphylococcus epidermidis bacteremia 11/28/97  . Pregnancy induced hypertension   . Osteoporosis 12/18/05    Increased  risk   . Increased BMI 12/18/05  . Post-menopausal bleeding 12/19/06  . Vaginal atrophy 12/19/06  . Elevated hemoglobin A1c     Borderline  . Fibroid 07/26/10    asymptomatic  . Vaginal atrophy   . PONV (postoperative nausea and vomiting)     nausea in past, none recent  . Endometrial carcinoma 05/28/12  . History of radiation therapy 6/4, 6/11, 6/19, 6/25, 07/29/2012    vagina 30.5 gray in 5 fx, HDR brachytherapy    Past Surgical History  Procedure Laterality Date  . Tubal ligation  1986  . Ectopic pregnancy surgery  1992  . Hysteroscopy w/d&c  09/27/2011    Procedure: DILATATION AND CURETTAGE /HYSTEROSCOPY;  Surgeon: Eldred Manges, MD;  Location: [facility];  Service: Gynecology;;  . Laparotomy  10/31/2011    Procedure: EXPLORATORY LAPAROTOMY;  Surgeon: [name], MD PHD;  Location: [facility];  Service: Gynecology;  Laterality: N/A;  . Abdominal hysterectomy  10/31/2011    Procedure: HYSTERECTOMY ABDOMINAL;  Surgeon: [name], MD PHD;  Location: [facility];  Service: Gynecology;  Laterality: N/A;  . Salpingoophorectomy  10/31/2011    Procedure: SALPINGO OOPHERECTOMY;   Surgeon: [name], MD PHD;  Location: [facility];  Service: Gynecology;  Laterality: Bilateral;   Lymph Nodes sampling    Family History  Problem Relation Age of Onset  . Colon cancer Mother    Social History:  reports that she has never smoked. She has never used smokeless tobacco. She reports that she does not drink alcohol or use illicit drugs.  Allergies:  Allergies  Allergen Reactions  . Codeine Nausea Only  Medications reviewed   (Not in a hospital admission)  Results for orders placed or performed during the hospital encounter of 05/31/14 (from the past 48 hour(s))  Urinalysis with microscopic     Status: Abnormal   Collection Time: 05/31/14  8:02 PM  Result Value Ref Range   Color, Urine YELLOW YELLOW   APPearance CLOUDY (A) CLEAR   Specific Gravity, Urine 1.011 1.005 - 1.030   pH 5.0 5.0 - 8.0   Glucose, UA NEGATIVE NEGATIVE mg/dL   Hgb urine dipstick NEGATIVE NEGATIVE   Bilirubin Urine NEGATIVE NEGATIVE   Ketones, ur NEGATIVE NEGATIVE mg/dL   Protein, ur NEGATIVE NEGATIVE mg/dL   Urobilinogen, UA 0.2 0.0 - 1.0 mg/dL   Nitrite NEGATIVE NEGATIVE   Leukocytes, UA NEGATIVE NEGATIVE    Comment: MICROSCOPIC NOT DONE ON URINES WITH NEGATIVE PROTEIN, BLOOD, LEUKOCYTES, NITRITE, OR GLUCOSE <1000 mg/dL.  CBC WITH DIFFERENTIAL     Status: Abnormal   Collection Time: 05/31/14  8:39 PM  Result Value Ref Range   WBC 39.5 (H) 4.0 - 10.5 K/uL    Comment: REPEATED TO VERIFY WHITE COUNT CONFIRMED ON SMEAR    RBC 3.82 (L) 3.87 - 5.11 MIL/uL   Hemoglobin 9.7 (L) 12.0 - 15.0 g/dL   HCT 29.3 (L) 36.0 - 46.0 %   MCV 76.7 (L) 78.0 - 100.0 fL   MCH 25.4 (L) 26.0 - 34.0 pg   MCHC 33.1 30.0 - 36.0 g/dL   RDW 16.5 (H) 11.5 - 15.5 %   Platelets 154 150 - 400 K/uL    Comment: REPEATED TO VERIFY PLATELET COUNT CONFIRMED BY SMEAR    Neutrophils Relative % 93 (H) 43 - 77 %   Lymphocytes Relative 1 (L) 12 - 46 %   Monocytes Relative 6 3 - 12 %   Eosinophils Relative 0 0 - 5 %    Basophils Relative 0 0 - 1 %   Neutro Abs 36.7 (H) 1.7 - 7.7 K/uL   Lymphs Abs 0.4 (L) 0.7 - 4.0 K/uL   Monocytes Absolute 2.4 (H) 0.1 - 1.0 K/uL   Eosinophils Absolute 0.0 0.0 - 0.7 K/uL   Basophils Absolute 0.0 0.0 - 0.1 K/uL   RBC Morphology OVALOCYTES    WBC Morphology MILD LEFT SHIFT (1-5% METAS, OCC MYELO, OCC BANDS)     Comment: TOXIC GRANULATION DOHLE BODIES    Smear Review LARGE PLATELETS PRESENT   Comprehensive metabolic panel     Status: Abnormal   Collection Time: 05/31/14  8:39 PM  Result Value Ref Range   Sodium 137 135 - 145 mmol/L   Potassium 3.1 (L) 3.5 - 5.1 mmol/L   Chloride 109 101 - 111 mmol/L   CO2 20 (L) 22 - 32 mmol/L   Glucose, Bld 137 (H) 70 - 99 mg/dL   BUN 43 (H) 6 - 20 mg/dL   Creatinine, Ser 3.90 (H) 0.44 - 1.00 mg/dL   Calcium 9.7 8.9 - 10.3 mg/dL   Total Protein 7.2 6.5 - 8.1 g/dL   Albumin 4.1 3.5 - 5.0 g/dL   AST 19 15 - 41 U/L   ALT 18 14 - 54 U/L   Alkaline Phosphatase 130 (H) 38 - 126 U/L   Total Bilirubin 0.5 0.3 - 1.2 mg/dL   GFR calc non Af Amer 11 (L) >60 mL/min   GFR calc Af Amer 13 (L) >60 mL/min    Comment: (NOTE) The eGFR has been calculated using the CKD EPI equation. This calculation has not been validated in all clinical situations. eGFR's persistently <90 mL/min signify possible Chronic Kidney Disease.    Anion gap 8 5 - 15  I-Stat CG4 Lactic Acid, ED  (not at [facility])     Status: Abnormal   Collection Time: 05/31/14  9:05 PM  Result Value Ref Range   Lactic Acid, Venous 2.87 (HH) 0.5 - 2.0 mmol/L   Comment NOTIFIED PHYSICIAN    Dg Chest 2 View  05/31/2014   CLINICAL DATA:  Stem cell transplant for multiple myeloma.  EXAM: CHEST  2 VIEW  COMPARISON:  Radiograph 03/25/2014  FINDINGS: Right central venous line with split catheter in the mid SVC. Mild central venous pulmonary congestion. No pneumothorax or pleural fluid. No acute osseous abnormality.  IMPRESSION: Central venous pulmonary congestion.   Electronically Signed   By:  Suzy Bouchard M.D.   On: 05/31/2014 20:38    Review of Systems  Constitutional: Positive for fever. Negative for chills, weight loss, malaise/fatigue and  diaphoresis.  HENT: Negative.   Eyes: Negative.   Respiratory: Negative.   Cardiovascular: Negative.   Gastrointestinal: Negative.   Genitourinary: Negative.   Musculoskeletal: Negative.   Skin: Negative.   Neurological: Negative.  Negative for weakness.  Endo/Heme/Allergies: Negative.   Psychiatric/Behavioral: Negative.     Blood pressure 125/52, pulse 54, temperature 100.5 F (38.1 C), temperature source Oral, resp. rate 20, SpO2 93 %. Physical Exam  Constitutional: She is oriented to person, place, and time. She appears well-developed and well-nourished.  HENT:  Head: Normocephalic and atraumatic.  Mouth/Throat: No oropharyngeal exudate.  Eyes: Conjunctivae and EOM are normal. Pupils are equal, round, and reactive to light. No scleral icterus.  Neck: Normal range of motion. Neck supple. No JVD present. No tracheal deviation present. No thyromegaly present.  Cardiovascular: Normal rate and regular rhythm.  Exam reveals no gallop and no friction rub.   No murmur heard. Respiratory: Effort normal and breath sounds normal. No respiratory distress. She has no wheezes. She has no rales. She exhibits no tenderness.  GI: Soft. Bowel sounds are normal. She exhibits no distension. There is no tenderness. There is no rebound and no guarding.  Musculoskeletal: Normal range of motion. She exhibits no edema or tenderness.  Lymphadenopathy:    She has no cervical adenopathy.  Neurological: She is alert and oriented to person, place, and time. She has normal reflexes. She displays normal reflexes. No cranial nerve deficit. She exhibits normal muscle tone. Coordination normal.  Skin: Skin is warm and dry. No rash noted. No erythema. No pallor.  Psychiatric: She has a normal mood and affect. Her behavior is normal. Judgment and thought  content normal.     Assessment/Plan Fever Blood culture x2 Check esr Pt received vanco per ed and zosyn.  VQ scan pending Please observe pt over nite and discuss with oncology in the am regarding choice of abx  ARF I spoke with nephrology, Dr. Louie Boston, we appreciate their input We will transfer to [facility] Check urine sodium, urine creatinine, urine eosinophils Check renal ultrasound Stop lasix, stop (Tribenzoar, which is benicar, amlodipine, hydrochlorothiazide) Hydrate with normal saline iv  Hypokalemia Check cmp in am,  No repletion for now  Hyperglycemia Check hga1c  Tachycardia Check trop i q6hx3 Check tsh, check echo  DVT prophylaxis, scd, lovenox  Lorali Khamis 05/31/2014, 11:14 PM

## 2014-06-01 ENCOUNTER — Inpatient Hospital Stay (HOSPITAL_COMMUNITY): Payer: BC Managed Care – PPO

## 2014-06-01 ENCOUNTER — Encounter (HOSPITAL_COMMUNITY): Payer: Self-pay | Admitting: *Deleted

## 2014-06-01 DIAGNOSIS — D72829 Elevated white blood cell count, unspecified: Secondary | ICD-10-CM

## 2014-06-01 DIAGNOSIS — N179 Acute kidney failure, unspecified: Principal | ICD-10-CM

## 2014-06-01 DIAGNOSIS — D6959 Other secondary thrombocytopenia: Secondary | ICD-10-CM

## 2014-06-01 DIAGNOSIS — D6481 Anemia due to antineoplastic chemotherapy: Secondary | ICD-10-CM

## 2014-06-01 DIAGNOSIS — C9002 Multiple myeloma in relapse: Secondary | ICD-10-CM

## 2014-06-01 DIAGNOSIS — T451X5A Adverse effect of antineoplastic and immunosuppressive drugs, initial encounter: Secondary | ICD-10-CM

## 2014-06-01 LAB — COMPREHENSIVE METABOLIC PANEL
ALT: 14 U/L (ref 14–54)
AST: 15 U/L (ref 15–41)
Albumin: 2.9 g/dL — ABNORMAL LOW (ref 3.5–5.0)
Alkaline Phosphatase: 113 U/L (ref 38–126)
Anion gap: 11 (ref 5–15)
BUN: 37 mg/dL — ABNORMAL HIGH (ref 6–20)
CO2: 20 mmol/L — ABNORMAL LOW (ref 22–32)
Calcium: 8.4 mg/dL — ABNORMAL LOW (ref 8.9–10.3)
Chloride: 108 mmol/L (ref 101–111)
Creatinine, Ser: 3.98 mg/dL — ABNORMAL HIGH (ref 0.44–1.00)
GFR calc Af Amer: 13 mL/min — ABNORMAL LOW (ref >60)
GFR calc non Af Amer: 11 mL/min — ABNORMAL LOW (ref >60)
Glucose, Bld: 122 mg/dL — ABNORMAL HIGH (ref 70–99)
Potassium: 3.2 mmol/L — ABNORMAL LOW (ref 3.5–5.1)
Sodium: 139 mmol/L (ref 135–145)
Total Bilirubin: 0.5 mg/dL (ref 0.3–1.2)
Total Protein: 5.4 g/dL — ABNORMAL LOW (ref 6.5–8.1)

## 2014-06-01 LAB — CBC WITH DIFFERENTIAL/PLATELET
Band Neutrophils: 0 % (ref 0–10)
Basophils Absolute: 0.3 10*3/uL — ABNORMAL HIGH (ref 0.0–0.1)
Basophils Relative: 1 % (ref 0–1)
Blasts: 0 %
Eosinophils Absolute: 0 10*3/uL (ref 0.0–0.7)
Eosinophils Relative: 0 % (ref 0–5)
HCT: 23.2 % — ABNORMAL LOW (ref 36.0–46.0)
Hemoglobin: 7.9 g/dL — ABNORMAL LOW (ref 12.0–15.0)
Lymphocytes Relative: 1 % — ABNORMAL LOW (ref 12–46)
Lymphs Abs: 0.3 10*3/uL — ABNORMAL LOW (ref 0.7–4.0)
MCH: 25.4 pg — ABNORMAL LOW (ref 26.0–34.0)
MCHC: 34.1 g/dL (ref 30.0–36.0)
MCV: 74.6 fL — ABNORMAL LOW (ref 78.0–100.0)
Metamyelocytes Relative: 0 %
Monocytes Absolute: 1 10*3/uL (ref 0.1–1.0)
Monocytes Relative: 3 % (ref 3–12)
Myelocytes: 0 %
Neutro Abs: 30.1 10*3/uL — ABNORMAL HIGH (ref 1.7–7.7)
Neutrophils Relative %: 95 % — ABNORMAL HIGH (ref 43–77)
Platelets: 137 10*3/uL — ABNORMAL LOW (ref 150–400)
Promyelocytes Absolute: 0 %
RBC: 3.11 MIL/uL — ABNORMAL LOW (ref 3.87–5.11)
RDW: 16.6 % — ABNORMAL HIGH (ref 11.5–15.5)
WBC Morphology: INCREASED
WBC: 31.7 10*3/uL — ABNORMAL HIGH (ref 4.0–10.5)
nRBC: 0 /100 WBC

## 2014-06-01 LAB — MRSA PCR SCREENING: MRSA by PCR: NEGATIVE

## 2014-06-01 LAB — SODIUM, URINE, RANDOM: Sodium, Ur: 63 mmol/L

## 2014-06-01 LAB — TROPONIN I
Troponin I: <0.03 ng/mL (ref <0.031)
Troponin I: 0.08 ng/mL — ABNORMAL HIGH (ref <0.031)

## 2014-06-01 LAB — TSH: TSH: 1.925 u[IU]/mL (ref 0.350–4.500)

## 2014-06-01 LAB — SEDIMENTATION RATE: Sed Rate: 33 mm/hr — ABNORMAL HIGH (ref 0–22)

## 2014-06-01 MED ORDER — VITAMIN D 1000 UNITS PO TABS
2000.0000 [IU] | ORAL_TABLET | Freq: Every day | ORAL | Status: DC
  Administered 2014-06-01: 2000 [IU] via ORAL
  Filled 2014-06-01: qty 2

## 2014-06-01 MED ORDER — TECHNETIUM TC 99M DIETHYLENETRIAME-PENTAACETIC ACID: 40.0000 | Freq: Once | INTRAVENOUS | Status: DC | PRN

## 2014-06-01 MED ORDER — TECHNETIUM TO 99M ALBUMIN AGGREGATED
6.0000 | Freq: Once | INTRAVENOUS | Status: AC | PRN
  Administered 2014-06-01: 6 via INTRAVENOUS

## 2014-06-01 MED ORDER — SODIUM CHLORIDE 0.9 % IJ SOLN: 10.0000 mL | INTRAMUSCULAR | Status: DC | PRN

## 2014-06-01 MED ORDER — PROCHLORPERAZINE MALEATE 10 MG PO TABS
10.0000 mg | ORAL_TABLET | Freq: Four times a day (QID) | ORAL | Status: DC | PRN
Start: 2014-06-01 — End: 2014-06-01
  Filled 2014-06-01: qty 1

## 2014-06-01 MED ORDER — ACETAMINOPHEN 325 MG PO TABS
650.0000 mg | ORAL_TABLET | Freq: Four times a day (QID) | ORAL | Status: DC | PRN
  Administered 2014-06-01: 650 mg via ORAL
  Filled 2014-06-01: qty 2

## 2014-06-01 MED ORDER — SODIUM CHLORIDE 0.9 % IJ SOLN
3.0000 mL | Freq: Two times a day (BID) | INTRAMUSCULAR | Status: DC
  Administered 2014-06-01: 3 mL via INTRAVENOUS

## 2014-06-01 MED ORDER — FLUTICASONE PROPIONATE HFA 220 MCG/ACT IN AERO
1.0000 | INHALATION_SPRAY | Freq: Two times a day (BID) | RESPIRATORY_TRACT | Status: DC
  Filled 2014-06-01: qty 12

## 2014-06-01 MED ORDER — METOPROLOL SUCCINATE ER 50 MG PO TB24
50.0000 mg | ORAL_TABLET | Freq: Every morning | ORAL | Status: DC
Start: 2014-06-02 — End: 2014-06-01

## 2014-06-01 MED ORDER — SODIUM CHLORIDE 0.9 % IV SOLN: INTRAVENOUS | Status: DC

## 2014-06-01 MED ORDER — METOPROLOL SUCCINATE ER 50 MG PO TB24
50.0000 mg | ORAL_TABLET | Freq: Every morning | ORAL | Status: DC
  Filled 2014-06-01: qty 1

## 2014-06-01 MED ORDER — LORAZEPAM 0.5 MG PO TABS: 0.5000 mg | ORAL_TABLET | Freq: Four times a day (QID) | ORAL | Status: DC | PRN

## 2014-06-01 MED ORDER — LORATADINE 10 MG PO TABS
10.0000 mg | ORAL_TABLET | Freq: Every morning | ORAL | Status: DC
  Administered 2014-06-01: 10 mg via ORAL
  Filled 2014-06-01: qty 1

## 2014-06-01 MED ORDER — SODIUM CHLORIDE 0.9 % IV BOLUS (SEPSIS)
500.0000 mL | Freq: Once | INTRAVENOUS | Status: AC
  Administered 2014-06-01: 500 mL via INTRAVENOUS

## 2014-06-01 MED ORDER — SODIUM CHLORIDE 0.9 % IV BOLUS (SEPSIS)
250.0000 mL | Freq: Once | INTRAVENOUS | Status: AC
  Administered 2014-06-01: 250 mL via INTRAVENOUS

## 2014-06-01 MED ORDER — OXYCODONE HCL 5 MG PO TABS: 5.0000 mg | ORAL_TABLET | Freq: Four times a day (QID) | ORAL | Status: DC | PRN

## 2014-06-01 MED ORDER — ONDANSETRON HCL 4 MG PO TABS: 8.0000 mg | ORAL_TABLET | Freq: Three times a day (TID) | ORAL | Status: DC | PRN

## 2014-06-01 MED ORDER — ENOXAPARIN SODIUM 30 MG/0.3ML ~~LOC~~ SOLN
30.0000 mg | Freq: Every day | SUBCUTANEOUS | Status: DC
  Administered 2014-06-01: 30 mg via SUBCUTANEOUS
  Filled 2014-06-01: qty 0.3

## 2014-06-01 MED ORDER — ACETAMINOPHEN 650 MG RE SUPP: 650.0000 mg | Freq: Four times a day (QID) | RECTAL | Status: DC | PRN

## 2014-06-01 NOTE — Discharge Summary (Signed)
DISCHARGE SUMMARY  Emily Greer  MR#: [id number]  DOB:[birth year]  Date of Admission: 05/31/2014 Date of Discharge: 06/01/2014  Attending Physician:MCCLUNG,JEFFREY T  Patient'Greer XHB:Emily S, MD  Consults: Oncology Nephrology   Disposition: D/C home to facilitate keeping of stem cell transplant appointment at [facility] 06/02/14      Follow-up Information    Follow up with [place] .   Why:  Keep your scheduled appointment for 06/02/2014 at 8:00 AM     Tests Needing Follow-up: -monitoring of renal function is indicated as pt is in acute renal failure -monitoring for signs of localizing infection in setting of FUO is suggested   Discharge Diagnoses: FUO Lactic acidosis Acute renal failure Anemia Thrombocytopenia Leukocytosis Hypokalemia Hyperglycemia Sinus tachycardia Multiple myeloma Hypertension History of endometrial carcinoma status post radiation therapy  Initial presentation: 64 year old female with multiple myeloma currently undergoing scheduled chemotherapy treatments who presented to the ER with the acute onset of fever the evening of her presentation. In the emergency room she was found to have a temperature of 100.5 as well as an elevated creatinine at 3.9. She denied localizing symptoms.   Hospital Course:  FUO Patient had a Hickman catheter placed 4/19 - UA is unremarkable - chest x-ray without focal infiltrate - blood cx thus far unrevealing - unclear etiology - Tmax 101.5 during this hospital stay - patient is advised to return to the emergency room immediately should she develop escalation of her fevers above 102 or if she develops any other localizing symptoms or new symptoms that caused her to fear that something is worsening - otherwise she is advised to use Tylenol when necessary and she is receiving an additional dose of Zosyn immediately prior to her discharge  Lactic acidosis Lactic acid 2.87 at presentation - most consistent  with significant dehydration - patient has been hydrated throughout her hospital stay - she is presenting to a [facility] medical facility first thing in the morning - though not ideal it is felt to be acceptable to proceed with discharge to allow her to keep her extremely important bone marrow transplant visit tomorrow  Acute renal failure Creatinine is slowly climbing - as per Nephrology:  "She is non-oliguric. UA is bland. Renal ultrasound shows 13.1 and 11.7 cm kidneys with normal echogenicity and no hydronephrosis. Possibly hemodynamically mediated in the setting of fever, relative hypotension with ARB on board. Cannot r/o component of vol depletion (on both furosemide and HZTC). AIN from cipro in the DDX but only 1 week of Rx. Hopefully this will turn around with improvement in BP and elimination of potentially offending medications and some gentle IV hydration." - the patient has been advised to avoid Ace inhibitors, ARBs, diuretics, or NSAIDs  Anemia Likely related to recent chemotherapy and malignancy itself  Thrombocytopenia Likely related to recent chemotherapy and malignancy itself - plan to transfuse if count less than 10,000 or if acutely bleeding with counts less than 20,000  Leukocytosis Due to recent Neulasta treatment  Hypokalemia Will not supplement further in the face of acute renal failure with potassium 3.2 at time of discharge  Hyperglycemia A1c pending at time of discharge  Sinus tachycardia Has improved markedly with volume resuscitation with heart rate in 90 at time of discharge  Multiple myeloma Followed by Dr. Marin Olp on Pomalidomide with Velcade - is followed at [facility] as well in their myeloma program - I have spoken with Dr. Marin Olp on the phone as well as with a nurse practitioner who is on the transplant  team at [facility] [name] [phone] - presently we feel it is worthwhile to allow the patient to keep her appointment in [facility] tomorrow so as not to delay the bone  marrow transplant process - I have counseled the patient and her husband at great length at bedside as to the risks associated with discharge from the hospital at this time but have assured them that presently we feel the risk benefit analysis leans toward proceeding with the transplant process - she is counseled extensively as to the warning signs that should prompt her to come back to the emergency room immediately and also told that she should return immediately even if she gets a strong feeling that something is not right - otherwise she will present as previously planned to the [facility] transplant facility 06/02/2014 no later than 8 AM - if the process is to require more than the one visit is quite possible that an admission at [facility] will be indicated  Hypertension Not an active concern at this time  History of endometrial carcinoma status post radiation therapy    Medication List    STOP taking these medications        ciprofloxacin 500 MG tablet  Commonly known as:  CIPRO     fluticasone 220 MCG/ACT inhaler  Commonly known as:  FLOVENT HFA     furosemide 20 MG tablet  Commonly known as:  LASIX     NEUPOGEN 300 MCG/0.5ML Sosy injection  Generic drug:  filgrastim     Olmesartan-Amlodipine-HCTZ 20-5-12.5 MG Tabs     pomalidomide 4 MG capsule  Commonly known as:  POMALYST     potassium chloride SA 20 MEQ tablet  Commonly known as:  K-DUR,KLOR-CON      TAKE these medications        loratadine 10 MG tablet  Commonly known as:  CLARITIN  Take 10 mg by mouth every morning.     LORazepam 0.5 MG tablet  Commonly known as:  ATIVAN  Take 1 tablet (0.5 mg total) by mouth every 6 (six) hours as needed (Nausea or vomiting).     metoprolol succinate 50 MG 24 hr tablet  Commonly known as:  TOPROL-XL  Take 50 mg by mouth every morning.     ondansetron 8 MG tablet  Commonly known as:  ZOFRAN  Take 8 mg by mouth every 8 (eight) hours as needed.     oxyCODONE 5 MG immediate release  tablet  Commonly known as:  Oxy IR/ROXICODONE  Take by mouth.     PRESCRIPTION MEDICATION  Chemo starts 06/09/14     prochlorperazine 10 MG tablet  Commonly known as:  COMPAZINE  Take 1 tablet (10 mg total) by mouth every 6 (six) hours as needed (Nausea or vomiting).     Vitamin D3 2000 UNITS Tabs  Take 1 tablet by mouth daily.       Day of Discharge BP 102/55 mmHg  Pulse 95  Temp(Src) 101.5 F (38.6 C) (Oral)  Resp 21  Ht $R'5\' 4"'zQ$  (1.626 m)  Wt 87.5 kg (192 lb 14.4 oz)  BMI 33.10 kg/m2  SpO2 94%  Physical Exam: General: No acute respiratory distress Lungs: Clear to auscultation bilaterally without wheezes or crackles Cardiovascular: Regular rate and rhythm without murmur gallop or rub normal S1 and S2 Abdomen: Nontender, nondistended, soft, bowel sounds positive, no rebound, no ascites, no appreciable mass Extremities: No significant cyanosis, clubbing, or edema bilateral lower extremities  Basic Metabolic Panel:  Recent  Labs Lab 05/27/14 1206 05/29/14 1039 05/31/14 2039 06/01/14 0500  NA 140 140 137 139  K 3.2* 3.1* 3.1* 3.2*  CL 99 103 109 108  CO2 29 25 20* 20*  GLUCOSE 93 88 137* 122*  BUN 16 21 43* 37*  CREATININE 1.1 1.4* 3.90* 3.98*  CALCIUM 9.4 9.2 9.7 8.4*   Liver Function Tests:  Recent Labs Lab 05/27/14 1206 05/29/14 1039 05/31/14 2039 06/01/14 0500  AST $Re'15 20 19 15  'XHl$ ALT $R'21 20 18 14  'gY$ ALKPHOS 48 54 130* 113  BILITOT 0.70 0.50 0.5 0.5  PROT 7.0 6.6 7.2 5.4*  ALBUMIN  --   --  4.1 2.9*   CBC:  Recent Labs Lab 05/27/14 1206 05/29/14 1038 05/31/14 2039 06/01/14 0500  WBC 0.4* 3.4* 39.5* 31.7*  NEUTROABS  --   --  36.7* 30.1*  HGB 10.3* 9.8* 9.7* 7.9*  HCT 30.6* 29.4* 29.3* 23.2*  MCV 77* 78* 76.7* 74.6*  PLT 67* 103* 154 137*    Cardiac Enzymes:  Recent Labs Lab 06/01/14 0001 06/01/14 0500  TROPONINI <0.03 0.08*    Recent Results (from the past 240 hour(Greer))  MRSA PCR Screening     Status: None   Collection Time: 06/01/14   2:00 AM  Result Value Ref Range Status   MRSA by PCR NEGATIVE NEGATIVE Final    Comment:        The GeneXpert MRSA Assay (FDA approved for NASAL specimens only), is one component of a comprehensive MRSA colonization surveillance program. It is not intended to diagnose MRSA infection nor to guide or monitor treatment for MRSA infections.       Time spent in discharge (includes decision making & examination of pt): >35 minutes  06/01/2014, 5:52 PM   Cherene Altes, MD [facility] Office  [phone] Pager [phone]  On-Call/Text Page:      [email or web address]      password [facility]

## 2014-06-01 NOTE — Discharge Instructions (Signed)
DO NOT TAKE TRIBENZOR, DIURETICS, ACE INHIBITORS, ANGIOTENSIN RECEPTOR BLOCKERS, OR ANY OF THE NSAIDs   Return to the hospital emergency room immediately if you develop fevers in excess of 102, or if you develop new symptoms that make you concerned that you're becoming more ill, as we discussed.   Use over the counter tylenol (as per the label instructions) as needed for pain or fevers at home.     Acute Kidney Injury Acute kidney injury is a disease in which there is sudden (acute) damage to the kidneys. The kidneys are 2 organs that lie on either side of the spine between the middle of the back and the front of the abdomen. The kidneys:  Remove wastes and extra water from the blood.   Produce important hormones. These help keep bones strong, regulate blood pressure, and help create red blood cells.   Balance the fluids and chemicals in the blood and tissues. A small amount of kidney damage may not cause problems, but a large amount of damage may make it difficult or impossible for the kidneys to work the way they should. Acute kidney injury may develop into long-lasting (chronic) kidney disease. It may also develop into a life-threatening disease called end-stage kidney disease. Acute kidney injury can get worse very quickly, so it should be treated right away. Early treatment may prevent other kidney diseases from developing.  CAUSES   A problem with blood flow to the kidneys. This may be caused by:   Blood loss.   Heart disease.   Severe burns.   Liver disease.  Direct damage to the kidneys. This may be caused by:  Some medicines.   A kidney infection.   Poisoning or consuming toxic substances.   A surgical wound.   A blow to the kidney area.   A problem with urine flow. This may be caused by:   Cancer.   Kidney stones.   An enlarged prostate. SYMPTOMS   Swelling (edema) of the legs, ankles, or feet.   Tiredness (lethargy).   Nausea or  vomiting.   Confusion.   Problems with urination, such as:   Painful or burning feeling during urination.   Decreased urine production.   Frequent accidents in children who are potty trained.   Bloody urine.   Muscle twitches and cramps.   Shortness of breath.   Seizures.   Chest pain or pressure. Sometimes, no symptoms are present. DIAGNOSIS Acute kidney injury may be detected and diagnosed by tests, including blood, urine, imaging, or kidney biopsy tests.  TREATMENT Treatment of acute kidney injury varies depending on the cause and severity of the kidney damage. In mild cases, no treatment may be needed. The kidneys may heal on their own. If acute kidney injury is more severe, your caregiver will treat the cause of the kidney damage, help the kidneys heal, and prevent complications from occurring. Severe cases may require a procedure to remove toxic wastes from the body (dialysis) or surgery to repair kidney damage. Surgery may involve:   Repair of a torn kidney.   Removal of an obstruction. Most of the time, you will need to stay overnight at the hospital.  HOME CARE INSTRUCTIONS:  Follow your prescribed diet.  Only take over-the-counter or prescription medicines as directed by your caregiver.  Do not take any new medicines (prescription, over-the-counter, or nutritional supplements) unless approved by your caregiver. Many medicines can worsen your kidney damage or need to have the dose adjusted.   Keep all follow-up  appointments as directed by your caregiver.  Observe your condition to make sure you are healing as expected. SEEK IMMEDIATE MEDICAL CARE IF:  You are feeling ill or have severe pain in the back or side.   Your symptoms return or you have new symptoms.  You have any symptoms of end-stage kidney disease. These include:   Persistent itchiness.   Loss of appetite.   Headaches.   Abnormally dark or light skin.  Numbness in the  hands or feet.   Easy bruising.   Frequent hiccups.   Menstruation stops.   You have a fever.  You have increased urine production.  You have pain or bleeding when urinating. MAKE SURE YOU:   Understand these instructions.  Will watch your condition.  Will get help right away if you are not doing well or get worse Document Released: 08/01/2010 Document Revised: 05/13/2012 Document Reviewed: 09/15/2011 [facility] Patient Information 2015 [place], [place]. This information is not intended to replace advice given to you by your health care provider. Make sure you discuss any questions you have with your health care provider.

## 2014-06-01 NOTE — Progress Notes (Signed)
[facility] progress note. Chief complaint. Transfer note. History of present illness. This 64 year old female presented to [facility] long emergency room with complaints of fever. Patient has a history of multiple myeloma recently completing chemotherapy. He was noted to have a temperature of 100.5 and a serum creatinine of 3.9. Dr. Moshe Cipro of nephrology was contacted in regards to acute renal failure and requested patient be transferred to [facility] for further workup. Patient has arrived in transfer and I'm seeing her at bedside to ensure she remains clinically stable and that her orders have transferred appropriately. Patient has no current complaints. Physical exam. Vital signs. Temperature 99.9, pulse 100, respiration 27, blood pressure 97/35. O2 sats 96%. General appearance. Well-developed female who is alert and in no distress. Cardiac. Rate and rhythm regular. Lungs. Breath sounds clear and equal. Abdomen. Soft with positive bowel sounds. His pain-free. Impression/plan. Problem #1. Fever unknown origin. Blood cultures 2, check ESR, antibiotics with vancomycin and Zosyn. VQ scan to rule out PE. Oncology will be consulted in the a.m. Problem #2. Acute renal failure. Check urine sodium, creatinine, eosinophils. Renal ultrasound obtained.. Lasix, Benicar, amlodipine, HCTZ and hydrate. Nephrology is on board. Problem 3. Hypokalemia. Repeat metabolic panel in a.m. Problem 4. Hyperglycemia. Hemoglobin A1c pending. Problem #5. Tachycardia. Troponins 3, TSH, and echocardiogram. Patient appears clinically stable post transfer. All orders transferred appropriately.

## 2014-06-01 NOTE — Progress Notes (Signed)
Pt d/c home per MD order, pt VSS, pt to f/u at [facility] in am. D/c instructions given, pt verbalized understanding of d/c, all questions answered

## 2014-06-01 NOTE — Progress Notes (Signed)
Received pt via carelink from [facility] ED to room 3s08.  Pt a&ox4, oriented to room and plan of care.  Verbalized understanding.  Husband at bedside.  VSS, no complaints or questions at this time.

## 2014-06-01 NOTE — Progress Notes (Signed)
Txt paged [facility] to notify of pt arrival to 3s08.

## 2014-06-01 NOTE — Progress Notes (Signed)
Emily Greer  [phone]    HOSPITAL PROGRESS  NOTE   HPI: Emily Greer is 64 year old female with recurrent lambda light chain myeloma. She has done well with the Pomalidomide with Velcade.She was seen on 4/21 by Dr. Alvie Heidelberg at [facility] for cyclophosphamide/mesna for stem cell mobilization  She was admitted on 5/1 with fevers up to 100.5, acute renal failure with Cr 3.9 requiring further management. Denies chills, night sweats, vision changes, or mucositis. Denies any respiratory complaints. Denies any chest pain or palpitations. Denies lower extremity swelling. Denies nausea, heartburn or change in bowel habits.   Appetite is normal. Denies any dysuria. Denies abnormal skin rashes, or neuropathy. Denies any bleeding issues such as epistaxis, hematemesis, hematuria or hematochezia. Ambulating without difficulty.  On admission she was placed on IV Vancomycin and Zosyn after cultures were drawn, and supportive care was initiated including IV fluids Renal service is involved as well  We have been informed of the patient's admission  Brief Oncological History: 1. Multiple myeloma-lambda light chains disease, diagnosed 7/99. - S/P 4 cycles of VAD, last 11/99, with decrease in M-spike from 26 g to 1.5 g. - S/P Cytoxan priming and stem cell collection - s/p VP-16 - S/p Melphalan and BCNU followed by auto transplant which she has tolerated well without major complications. - SPEP/UPEP 1/01 negative. BMBX 1/01 negative. - 18 month f/u: UPEP neg, SPEP faint monoclonal component in gamma region, Ig profile wnl, B2 m 0.8, Ca 10.3, Hgb 13.2  - 2003: 2 year f/u: no evidence of MM relapsed, UPEP neg. SPEP normal, IFE multiple constriction of the IgG kappa immunospecificity, BMBX neg, Ca 9.6, B84m1.8, Hgb 12.2. - 2004: 3 year followup, no evidence of relapsed multiple myeloma.  - 2007?: Thalidomide - 2012?: Thalidomide changed to Lenalidomide - 2014?: Lenalidomide changed  to pomalidomide - 09/2013: Bortezomib added to Pomalidomide. FLC lambda 22.8, ratio 0.04. Bmbx: 40% lambda restricted plasma cells in a 60% cellular bone marrow. - 01/2014: [facility] New Patient Referral Spep/ife negative, FLC lambda 4.46, ratio wnl.  --03/2014, ECHO EF>55%, CXR, no acute pulmonary process. PFT DLCO 56%, EKG, qt/qtc 400/476. Spep/ife negative, FLC lambda 7.03, ratio 0.20.   Current therapy Treatment Schema: Cyclophosphamide 3000 mg/m2 (5190 mg) IV once on 05/20/14 Mesna 5190 mg CIV over 24 hours once on 05/20/14    MEDICATIONS: Scheduled Meds: . cholecalciferol  2,000 Units Oral Daily  . enoxaparin (LOVENOX) injection  30 mg Subcutaneous Daily  . fluticasone  1 puff Inhalation BID  . loratadine  10 mg Oral q morning - 10a  . metoprolol succinate  50 mg Oral q morning - 10a  . piperacillin-tazobactam (ZOSYN)  IV  2.25 g Intravenous Q6H  . sodium chloride  3 mL Intravenous Q12H  . [START ON 06/02/2014] vancomycin  1,000 mg Intravenous Q48H   Continuous Infusions: . sodium chloride     PRN Meds:.acetaminophen **OR** acetaminophen, hydrALAZINE, LORazepam, ondansetron, oxyCODONE, prochlorperazine, sodium chloride, sodium chloride ALLERGIES:   Allergies  Allergen Reactions  . Codeine Nausea Only     PHYSICAL EXAMINATION:  Filed Vitals:   06/01/14 0700  BP:   Pulse:   Temp: 99.3 F (37.4 C)  Resp:    Filed Weights   06/01/14 0130 06/01/14 0352  Weight: 192 lb 14.4 oz (87.5 kg) 192 lb 14.4 oz (87.5 kg)    GENERAL:alert, no distress and comfortable SKIN: skin color, texture, turgor are normal, no rashes or significant lesions EYES: normal, conjunctiva are pink and  non-injected, sclera clear OROPHARYNX:no exudate, no erythema and lips, buccal mucosa, and tongue normal  NECK: supple, thyroid normal size, non-tender, without nodularity LYMPH:  no palpable lymphadenopathy in the cervical, axillary or inguinal LUNGS: clear to auscultation and percussion  with normal breathing effort HEART: regular rate & rhythm and no murmurs and no lower extremity edema ABDOMEN:abdomen soft, non-tender and normal bowel sounds Musculoskeletal:no cyanosis of digits and no clubbing  PSYCH: alert & oriented x 3 with fluent speech NEURO: no focal motor/sensory deficits   LABORATORY/RADIOLOGY DATA:   Recent Labs Lab 05/25/14 1214 05/27/14 1206 05/29/14 1038 05/31/14 2039 06/01/14 0500  WBC 0.7* 0.4* 3.4* 39.5* 31.7*  HGB 10.3* 10.3* 9.8* 9.7* 7.9*  HCT 30.9* 30.6* 29.4* 29.3* 23.2*  PLT 107* 67* 103* 154 137*  MCV 78* 77* 78* 76.7* 74.6*  MCH 26.0 25.8* 25.9* 25.4* 25.4*  MCHC 33.3 33.7 33.3 33.1 34.1  RDW 15.9* 15.3 15.6 16.5* 16.6*  LYMPHSABS 0.2*  --   --  0.4* 0.3*  MONOABS  --   --   --  2.4* 1.0  EOSABS 0.0  --   --  0.0 0.0  BASOSABS 0.0  --   --  0.0 0.3*    CMP    Recent Labs Lab 05/25/14 1214 05/27/14 1206 05/29/14 1039 05/31/14 2039 06/01/14 0500  NA 140 140 140 137 139  K 3.2* 3.2* 3.1* 3.1* 3.2*  CL 99 99 103 109 108  CO2 32 29 25 20* 20*  GLUCOSE 86 93 88 137* 122*  BUN _0 43* 37*  CREATININE 1.1 1.1 1.4* 3.90* 3.98*  CALCIUM 9.5 9.4 9.2 9.7 8.4*  AST _1 ALT _2 ALKPHOS 44 48 54 130* 113  BILITOT 0.70 0.70 0.50 0.5 0.5        Component Value Date/Time   BILITOT 0.5 06/01/2014 0500   BILITOT 0.50 05/29/2014 1039   BILITOT 0.47 09/04/2013 1049    Anemia panel:   No results for input(s): VITAMINB12, FOLATE, FERRITIN, TIBC, IRON, RETICCTPCT in the last 72 hours.   Recent Labs  06/01/14 0001  TSH 1.925        Component Value Date/Time   ESRSEDRATE 33* 05/31/2014 2039    No results for input(s): INR, PROTIME in the last 168 hours.    Urinalysis    Component Value Date/Time   COLORURINE YELLOW 05/31/2014 2002   APPEARANCEUR CLOUDY* 05/31/2014 2002   LABSPEC 1.011 05/31/2014 2002   LABSPEC 1.015 01/29/2014 1021   LABSPEC 1.030 11/11/2013 1347   PHURINE 5.0  05/31/2014 2002   GLUCOSEU NEGATIVE 05/31/2014 2002   GLUCOSEU Negative 11/11/2013 [place] 05/31/2014 2002   BILIRUBINUR NEGATIVE 05/31/2014 2002   KETONESUR NEGATIVE 05/31/2014 2002   PROTEINUR NEGATIVE 05/31/2014 2002   UROBILINOGEN 0.2 05/31/2014 2002   UROBILINOGEN 0.2 01/29/2014 1021   UROBILINOGEN 0.2 11/11/2013 1347   NITRITE NEGATIVE 05/31/2014 2002   NITRITE Negative 11/11/2013 1347   LEUKOCYTESUR NEGATIVE 05/31/2014 2002    Drugs of Abuse  No results found for: LABOPIA, COCAINSCRNUR, LABBENZ, AMPHETMU, THCU, LABBARB   Liver Function Tests:  Recent Labs Lab 05/25/14 1214 05/27/14 1206 05/29/14 1039 05/31/14 2039 06/01/14 0500  AST _3 ALT _4 ALKPHOS 44 48 54 130* 113  BILITOT 0.70 0.70 0.50 0.5 0.5  PROT 6.7 7.0 6.6 7.2 5.4*  ALBUMIN  --   --   --  4.1 2.9*   No results for input(s): LIPASE, AMYLASE in the last 168 hours. No results for input(s): AMMONIA in the last 168 hours.  CBG: No results for input(s): GLUCAP in the last 168 hours. Hgb A1c No results for input(s): HGBA1C in the last 72 hours.   D-Dimer No results for input(s): DDIMER in the last 72 hours.  Thyroid function studies  Recent Labs  06/01/14 0001  TSH 1.925    Radiology Studies:  Dg Chest 2 View  05/31/2014   CLINICAL DATA:  Stem cell transplant for multiple myeloma.  EXAM: CHEST  2 VIEW  COMPARISON:  Radiograph 03/25/2014  FINDINGS: Right central venous line with split catheter in the mid SVC. Mild central venous pulmonary congestion. No pneumothorax or pleural fluid. No acute osseous abnormality.  IMPRESSION: Central venous pulmonary congestion.   Electronically Signed   By: Suzy Bouchard M.D.   On: 05/31/2014 20:38       ASSESSMENT AND PLAN:  Relapsed multiple myeloma Emily Greer is a very pleasant 64 yo AA female with relapsed multiple myeloma.She was originally diagnosed in 1999. She underwent induction therapy and ASCT on an  experimental protocol here at [facility]. She achieved a prolonged remission (~7-8 years) with eventual relapse in the late 2000s. She has been treated with thalidomide, lenalidomide, pomalidomide and most recently the combination of pomalidomide and bortezomib. Her most recent restaging showed good response. Hickman placement on 4/19   She received Cytoxan priming on 05/20/2014 She is followed by Dr.Shawntrice Salle on M/W/F for labs and supportive care starting on 05/22/2014 Started GCSF and cipro on 05/22/2014 RTC for apheresis as scheduled   Anemia Due to recent chemotherapy, dilution, infection, antibiotics  No bleeding issues are reported Would hold transfusion 1 more day  Monitor counts closely  Thrombocytopenia This is due to malignancy, dilution, recent chemo, infection  Monitor counts closely No transfusion is indicated at this time Transfuse 1 unit of platelets if count is less or equal than 10,000 or 20,000 if the patient is acutely bleeding Hold Lovenox if  platelets drop to less than 50,000  Leukocytosis Fever This is due to recent Neulasta, infection, and fever Cu;tures pending Continue supportive IV antibiotics No intervention is indicated at this time Will continue to monitor  Acute Renal Failure She was transferred to [facility] for management of her ARF Renal US pending Appreciate Renal Service follow up  DVT prophylaxis On Lovenox  Full Code  Other medical issues as per admitting team      **Disclaimer: This note was dictated with voice recognition software. Similar sounding words can inadvertently be transcribed and this note may contain transcription errors which may not have been corrected upon publication of note.**  WERTMAN,SARA E, PA-C 06/01/2014, 11:24 AM  ADDENDUM:  I grew the above. I just had no idea stew Y she has the renal insufficiency. She received high-dose Cytoxan for her stem cell collection regimen. We just saw her recently in the office and she  looked quite good.  I suppose the renal efficiency could be from dehydration.  I spoke to one of the transplant doctors at [facility]. I think she is scheduled for transplant or maybe even collection tomorrow. They really would like to get her out there to have the procedure.  I think that she will be okay for discharge today. There is no fever. Her cultures have been negative. She had a renal ultrasound done today which was normal.  We will anticipate that she will be admitted  at at [facility] for the procedure and he collection if not done already.  She has done incredibly well to date. She's had no problems with her chemotherapy for the myeloma relapse. She is was maintained a good renal function state.  Harriette Ohara 32:27

## 2014-06-01 NOTE — Consult Note (Signed)
[facility] - Nephrology Consult Note Reason for Consult: Acute Renal Failure Referring Physician: Dr. Jani Gravel / [facility]  HPI: Emily Greer is a 64 y.o. female with PMH recurrent Multiple Myeloma (lamba light chain myeloma) s/p bone marrow transplant 15 years ago and currently receiving chemotherapy, in preparation for repeat bone marrow transplant. She is currently accompanied by her husband. Reported that she initially presented to [facility] on evening of 05/31/14 with chief complaint of low-grade fevers (Tmax at home 100.10F) and nausea without significant vomiting. Previously, she had been feeling well before these symptoms. Initially called Oncology (Dr. Antonieta Pert office) and advised to go to ED to be evaluated given history of neutropenia and low-grade fever. Temperature confirmed at [facility] (Tmax 100.83F), and found to have elevated Cr to 3.90 (from 1.4 on 4/29) consistent with ARF, Dr. Moshe Cipro (Nephrology) was contacted and advised patient be transferred to [facility] for further work-up.  Additionally, the patient recently had received chemotherapy including, Bortezomib and Pomalidomide on 04/23/14, Cytoxan/[place] at [facility] (Heme/Onc) on 4/20, followed by Cipro x 1 week (4/22 - 4/28), Neupogen injections over this past weekend (for bone marrow stimulation, in preparation for upcoming bone marrow transplant this week at [facility]). Otherwise, no new medications. No NSAID's. On ARB chronically as well as 2 diuretics (HCTZ and furosemide). Unknown if hypotension PTA as no home BP monitoring, but BP 90's in ED and early in admission.  On admission, the patient denied any CP, SOB, cough, nausea / vomiting, diarrhea, dysuria. She has continued to make urine. She was started on broad spectrum antibiotics, labs, further work-up performed, additionally Oncology (Dr. Marin Olp) was consulted.  Currently, patient has just returned from radiology department. She admits to feeling "chilled" but  without recent shaking chills. Denies any nausea or pain at this time. Voiding without difficulty.  Recent creatinine Trending for reference is as follows: 06/01/2014 05:00 AM 3.98* 0.44 - 1.00 mg/dL Final  05/31/2014 08:39 PM 3.90* 0.44 - 1.00 mg/dL Final   05/29/2014 10:39 AM 1.4* 0.6 - 1.2 mg/dl Final  05/27/2014 12:06 PM 1.1 0.6 - 1.2 mg/dl Final  05/25/2014 12:14 PM 1.1 0.6 - 1.2 mg/dl Final  05/20/2014  Cytoxan/creatinine 0.9 ([facility]).     05/19/2014 Started cipro 500 BID ([facility])    04/23/2014 10:10 AM 0.9 0.6 - 1.2 mg/dl Final  03/11/2014 09:27 AM 0.7 0.6 - 1.2 mg/dl Final  03/05/2014 03:00 PM 1.0 0.6 - 1.2 mg/dl Final  02/25/2014 09:20 AM 0.8 0.6 - 1.2 mg/dl Final  02/11/2014 09:52 AM 1.3* 0.6 - 1.2 mg/dl Final  02/04/2014 10:47 AM 0.9 0.6 - 1.2 mg/dl Final  01/29/2014 08:32 AM 1.0 0.6 - 1.2 mg/dl Final  01/14/2014 09:25 AM 0.8 0.6 - 1.2 mg/dl Final  01/07/2014 12:06 PM 1.0 0.6 - 1.2 mg/dl Final  12/31/2013 11:10 AM 0.8 0.6 - 1.2 mg/dl Final  12/17/2013 09:03 AM 1.0 0.6 - 1.2 mg/dl Final  12/10/2013 08:46 AM 1.2 0.6 - 1.2 mg/dl Final  11/20/2013 10:02 AM 1.3* 0.6 - 1.2 mg/dl Final  11/05/2013 09:03 AM 1.0 0.6 - 1.2 mg/dl Final    Past Medical History  Diagnosis Date  . Lambda light chain myeloma 11/11/2007  . Hypertension 11/28/97  . H/O multiple myeloma 11/28/1997  . Staphylococcus aureus bacteremia 11/28/1997  . Staphylococcus epidermidis bacteremia 11/28/97  . Pregnancy induced hypertension   . Osteoporosis 12/18/05    Increased  risk   . Increased BMI 12/18/05  . Post-menopausal bleeding 12/19/06  . Vaginal atrophy 12/19/06  .  Elevated hemoglobin A1c     Borderline  . Fibroid 07/26/10    asymptomatic  . Vaginal atrophy   . PONV (postoperative nausea and vomiting)     nausea in past, none recent  . Endometrial carcinoma 05/28/12  . History of radiation therapy 6/4, 6/11, 6/19, 6/25, 07/29/2012    vagina 30.5 gray in 5 fx, HDR brachytherapy    Past Surgical  History  Procedure Laterality Date  . Tubal ligation  1986  . Ectopic pregnancy surgery  1992  . Hysteroscopy w/d&c  09/27/2011    Procedure: DILATATION AND CURETTAGE /HYSTEROSCOPY;  Surgeon: Eldred Manges, MD;  Location: [facility];  Service: Gynecology;;  . Laparotomy  10/31/2011    Procedure: EXPLORATORY LAPAROTOMY;  Surgeon: [name], MD PHD;  Location: [facility];  Service: Gynecology;  Laterality: N/A;  . Abdominal hysterectomy  10/31/2011    Procedure: HYSTERECTOMY ABDOMINAL;  Surgeon: [name], MD PHD;  Location: [facility];  Service: Gynecology;  Laterality: N/A;  . Salpingoophorectomy  10/31/2011    Procedure: SALPINGO OOPHERECTOMY;  Surgeon: [name], MD PHD;  Location: [facility];  Service: Gynecology;  Laterality: Bilateral;   Lymph Nodes sampling    Allergies  Allergen Reactions  . Codeine Nausea Only    Medications:   Prior to Admission medications   Medication Sig Start Date End Date Taking? Authorizing Provider  Cholecalciferol (VITAMIN D3) 2000 UNITS TABS Take 1 tablet by mouth daily.   Yes Historical Provider, MD  ciprofloxacin (CIPRO) 500 MG tablet Take 500 mg by mouth 2 (two) times daily.  05/19/14  Yes Historical Provider, MD  fluticasone (FLOVENT HFA) 220 MCG/ACT inhaler Inhale 1 puff into the lungs 2 (two) times daily.  04/30/14 05/31/14 Yes Historical Provider, MD  furosemide (LASIX) 20 MG tablet TAKE 1 TABLET BY MOUTH EVERY DAY AS NEEDED FOR SWELLING OF ARMS/LEGS Patient taking differently: take 1 tablet daily 12/31/13  Yes [name], MD  loratadine (CLARITIN) 10 MG tablet Take 10 mg by mouth every morning.    Yes Historical Provider, MD  LORazepam (ATIVAN) 0.5 MG tablet Take 1 tablet (0.5 mg total) by mouth every 6 (six) hours as needed (Nausea or vomiting). 11/05/13  Yes [name], MD  metoprolol (TOPROL-XL) 50 MG 24 hr tablet Take 50 mg by mouth every morning.    Yes Historical Provider, MD  Olmesartan-Amlodipine-HCTZ 20-5-12.5 MG TABS Take 1 tablet  by mouth every morning.    Yes Historical Provider, MD  ondansetron (ZOFRAN) 8 MG tablet Take 8 mg by mouth every 8 (eight) hours as needed.  11/05/13  Yes Historical Provider, MD  oxyCODONE (OXY IR/ROXICODONE) 5 MG immediate release tablet Take by mouth. 05/19/14  Yes Historical Provider, MD  potassium chloride SA (K-DUR,KLOR-CON) 20 MEQ tablet Take 1 tablet (20 mEq total) by mouth daily. 05/25/14  Yes [name], MD  prochlorperazine (COMPAZINE) 10 MG tablet Take 1 tablet (10 mg total) by mouth every 6 (six) hours as needed (Nausea or vomiting). 11/07/13  Yes [name], MD  NEUPOGEN 300 MCG/0.5ML SOSY injection Inject 900 mcg as directed daily. For 10 days 05/08/14   Historical Provider, MD  pomalidomide (POMALYST) 4 MG capsule Take 1 capsule (4 mg total) by mouth daily. Take with water on days 1-21. Repeat every 28 days. Authorization Number: [id number] Patient not taking: Reported on 05/31/2014 05/14/14   [name], MD  PRESCRIPTION MEDICATION Chemo starts 06/09/14    Historical Provider, MD   Current Scheduled Medications .  cholecalciferol  2,000 Units Oral Daily  . enoxaparin (LOVENOX) injection  30 mg Subcutaneous Daily  . fluticasone  1 puff Inhalation BID  . loratadine  10 mg Oral q morning - 10a  . metoprolol succinate  50 mg Oral q morning - 10a  . piperacillin-tazobactam (ZOSYN)  IV  2.25 g Intravenous Q6H  . sodium chloride  3 mL Intravenous Q12H  . [START ON 06/02/2014] vancomycin  1,000 mg Intravenous Q48H     Medications Discontinued During This Encounter  Medication Reason  . nystatin (MYCOSTATIN) 100000 UNIT/ML suspension Discontinued by provider  . loperamide (IMODIUM) 2 MG capsule Discontinued by provider  . calcium carbonate (OS-CAL) 600 MG TABS Discontinued by provider  . Biotin 5000 MCG TABS Discontinued by provider  . aspirin 81 MG tablet Discontinued by provider  . allopurinol (ZYLOPRIM) 100 MG tablet Discontinued by provider  . potassium chloride SA  (K-DUR,KLOR-CON) 20 MEQ tablet Duplicate  . acyclovir (ZOVIRAX) 400 MG tablet   . 0.9 %  sodium chloride infusion   . 0.9 %  sodium chloride infusion      Family History  Problem Relation Age of Onset  . Colon cancer Mother     Social History:  reports that she has never smoked. She has never used smokeless tobacco. She reports that she does not drink alcohol or use illicit drugs. ROS  Blood pressure 102/55, pulse 95, temperature 99.9 F (37.7 C), temperature source Oral, resp. rate 21, height _0  (1.626 m), weight 192 lb 14.4 oz (87.5 kg), SpO2 94 %.  Physical Exam  Gen - pleasant, 78 yr AA F, resting in bed, well-appearing, cooperative, NAD HEENT - NCAT, PERRL, oropharynx clear, MMM Neck - supple, non-tender Heart - RRR, no murmurs heard Lungs - CTAB, no crackles, wheezing, or rhonchi. Normal work of breathing. Abd - soft, NTND, no masses, +active BS Ext - non-tender, no edema, peripheral pulses intact +2 b/l Skin - warm, dry, no rashes. Hickman access port. Neuro - awake, alert, oriented  Results for orders placed or performed during the hospital encounter of 05/31/14 (from the past 48 hour(s))  Urinalysis with microscopic     Status: Abnormal   Collection Time: 05/31/14  8:02 PM  Result Value Ref Range   Color, Urine YELLOW YELLOW   APPearance CLOUDY (A) CLEAR   Specific Gravity, Urine 1.011 1.005 - 1.030   pH 5.0 5.0 - 8.0   Glucose, UA NEGATIVE NEGATIVE mg/dL   Hgb urine dipstick NEGATIVE NEGATIVE   Bilirubin Urine NEGATIVE NEGATIVE   Ketones, ur NEGATIVE NEGATIVE mg/dL   Protein, ur NEGATIVE NEGATIVE mg/dL   Urobilinogen, UA 0.2 0.0 - 1.0 mg/dL   Nitrite NEGATIVE NEGATIVE   Leukocytes, UA NEGATIVE NEGATIVE    Comment: MICROSCOPIC NOT DONE ON URINES WITH NEGATIVE PROTEIN, BLOOD, LEUKOCYTES, NITRITE, OR GLUCOSE <1000 mg/dL.  CBC WITH DIFFERENTIAL     Status: Abnormal   Collection Time: 05/31/14  8:39 PM  Result Value Ref Range   WBC 39.5 (H) 4.0 - 10.5 K/uL     Comment: REPEATED TO VERIFY WHITE COUNT CONFIRMED ON SMEAR    RBC 3.82 (L) 3.87 - 5.11 MIL/uL   Hemoglobin 9.7 (L) 12.0 - 15.0 g/dL   HCT 29.3 (L) 36.0 - 46.0 %   MCV 76.7 (L) 78.0 - 100.0 fL   MCH 25.4 (L) 26.0 - 34.0 pg   MCHC 33.1 30.0 - 36.0 g/dL   RDW 16.5 (H) 11.5 - 15.5 %   Platelets 154 150 -  400 K/uL    Comment: REPEATED TO VERIFY PLATELET COUNT CONFIRMED BY SMEAR    Neutrophils Relative % 93 (H) 43 - 77 %   Lymphocytes Relative 1 (L) 12 - 46 %   Monocytes Relative 6 3 - 12 %   Eosinophils Relative 0 0 - 5 %   Basophils Relative 0 0 - 1 %   Neutro Abs 36.7 (H) 1.7 - 7.7 K/uL   Lymphs Abs 0.4 (L) 0.7 - 4.0 K/uL   Monocytes Absolute 2.4 (H) 0.1 - 1.0 K/uL   Eosinophils Absolute 0.0 0.0 - 0.7 K/uL   Basophils Absolute 0.0 0.0 - 0.1 K/uL   RBC Morphology OVALOCYTES    WBC Morphology MILD LEFT SHIFT (1-5% METAS, OCC MYELO, OCC BANDS)     Comment: TOXIC GRANULATION DOHLE BODIES    Smear Review LARGE PLATELETS PRESENT   Comprehensive metabolic panel     Status: Abnormal   Collection Time: 05/31/14  8:39 PM  Result Value Ref Range   Sodium 137 135 - 145 mmol/L   Potassium 3.1 (L) 3.5 - 5.1 mmol/L   Chloride 109 101 - 111 mmol/L   CO2 20 (L) 22 - 32 mmol/L   Glucose, Bld 137 (H) 70 - 99 mg/dL   BUN 43 (H) 6 - 20 mg/dL   Creatinine, Ser 3.90 (H) 0.44 - 1.00 mg/dL   Calcium 9.7 8.9 - 10.3 mg/dL   Total Protein 7.2 6.5 - 8.1 g/dL   Albumin 4.1 3.5 - 5.0 g/dL   AST 19 15 - 41 U/L   ALT 18 14 - 54 U/L   Alkaline Phosphatase 130 (H) 38 - 126 U/L   Total Bilirubin 0.5 0.3 - 1.2 mg/dL   GFR calc non Af Amer 11 (L) >60 mL/min   GFR calc Af Amer 13 (L) >60 mL/min    Comment: (NOTE) The eGFR has been calculated using the CKD EPI equation. This calculation has not been validated in all clinical situations. eGFR's persistently <90 mL/min signify possible Chronic Kidney Disease.    Anion gap 8 5 - 15  Sedimentation rate     Status: Abnormal   Collection Time: 05/31/14  8:39  PM  Result Value Ref Range   Sed Rate 33 (H) 0 - 22 mm/hr  I-Stat CG4 Lactic Acid, ED  (not at [facility])     Status: Abnormal   Collection Time: 05/31/14  9:05 PM  Result Value Ref Range   Lactic Acid, Venous 2.87 (HH) 0.5 - 2.0 mmol/L   Comment NOTIFIED PHYSICIAN   TSH     Status: None   Collection Time: 06/01/14 12:01 AM  Result Value Ref Range   TSH 1.925 0.350 - 4.500 uIU/mL  Troponin I (q 6hr x 3)     Status: None   Collection Time: 06/01/14 12:01 AM  Result Value Ref Range   Troponin I <0.03 <0.031 ng/mL    Comment:        NO INDICATION OF MYOCARDIAL INJURY.   MRSA PCR Screening     Status: None   Collection Time: 06/01/14  2:00 AM  Result Value Ref Range   MRSA by PCR NEGATIVE NEGATIVE    Comment:        The GeneXpert MRSA Assay (FDA approved for NASAL specimens only), is one component of a comprehensive MRSA colonization surveillance program. It is not intended to diagnose MRSA infection nor to guide or monitor treatment for MRSA infections.   Sodium, urine, random  Status: None   Collection Time: 06/01/14  3:52 AM  Result Value Ref Range   Sodium, Ur 63 mmol/L  CBC with Differential/Platelet     Status: Abnormal   Collection Time: 06/01/14  5:00 AM  Result Value Ref Range   WBC 31.7 (H) 4.0 - 10.5 K/uL   RBC 3.11 (L) 3.87 - 5.11 MIL/uL   Hemoglobin 7.9 (L) 12.0 - 15.0 g/dL   HCT 23.2 (L) 36.0 - 46.0 %   MCV 74.6 (L) 78.0 - 100.0 fL   MCH 25.4 (L) 26.0 - 34.0 pg   MCHC 34.1 30.0 - 36.0 g/dL   RDW 16.6 (H) 11.5 - 15.5 %   Platelets 137 (L) 150 - 400 K/uL   Neutrophils Relative % 95 (H) 43 - 77 %   Lymphocytes Relative 1 (L) 12 - 46 %   Monocytes Relative 3 3 - 12 %   Eosinophils Relative 0 0 - 5 %   Basophils Relative 1 0 - 1 %   Band Neutrophils 0 0 - 10 %   Metamyelocytes Relative 0 %   Myelocytes 0 %   Promyelocytes Absolute 0 %   Blasts 0 %   nRBC 0 0 /100 WBC   Neutro Abs 30.1 (H) 1.7 - 7.7 K/uL   Lymphs Abs 0.3 (L) 0.7 - 4.0 K/uL   Monocytes  Absolute 1.0 0.1 - 1.0 K/uL   Eosinophils Absolute 0.0 0.0 - 0.7 K/uL   Basophils Absolute 0.3 (H) 0.0 - 0.1 K/uL   RBC Morphology ELLIPTOCYTES     Comment: POLYCHROMASIA PRESENT   WBC Morphology INCREASED BANDS (>20% BANDS)     Comment: MILD LEFT SHIFT (1-5% METAS, OCC MYELO, OCC BANDS) TOXIC GRANULATION DOHLE BODIES   Comprehensive metabolic panel     Status: Abnormal   Collection Time: 06/01/14  5:00 AM  Result Value Ref Range   Sodium 139 135 - 145 mmol/L   Potassium 3.2 (L) 3.5 - 5.1 mmol/L   Chloride 108 101 - 111 mmol/L   CO2 20 (L) 22 - 32 mmol/L   Glucose, Bld 122 (H) 70 - 99 mg/dL   BUN 37 (H) 6 - 20 mg/dL   Creatinine, Ser 3.98 (H) 0.44 - 1.00 mg/dL   Calcium 8.4 (L) 8.9 - 10.3 mg/dL   Total Protein 5.4 (L) 6.5 - 8.1 g/dL   Albumin 2.9 (L) 3.5 - 5.0 g/dL   AST 15 15 - 41 U/L   ALT 14 14 - 54 U/L   Alkaline Phosphatase 113 38 - 126 U/L   Total Bilirubin 0.5 0.3 - 1.2 mg/dL   GFR calc non Af Amer 11 (L) >60 mL/min   GFR calc Af Amer 13 (L) >60 mL/min    Comment: (NOTE) The eGFR has been calculated using the CKD EPI equation. This calculation has not been validated in all clinical situations. eGFR's persistently <90 mL/min signify possible Chronic Kidney Disease.    Anion gap 11 5 - 15  Troponin I (q 6hr x 3)     Status: Abnormal   Collection Time: 06/01/14  5:00 AM  Result Value Ref Range   Troponin I 0.08 (H) <0.031 ng/mL    Comment:        PERSISTENTLY INCREASED TROPONIN VALUES IN THE RANGE OF 0.04-0.49 ng/mL CAN BE SEEN IN:       -UNSTABLE ANGINA       -CONGESTIVE HEART FAILURE       -MYOCARDITIS       -CHEST  TRAUMA       -ARRYHTHMIAS       -LATE PRESENTING MYOCARDIAL INFARCTION       -COPD   CLINICAL FOLLOW-UP RECOMMENDED.     Dg Chest 2 View  05/31/2014   CLINICAL DATA:  Stem cell transplant for multiple myeloma.  EXAM: CHEST  2 VIEW  COMPARISON:  Radiograph 03/25/2014  FINDINGS: Right central venous line with split catheter in the mid SVC. Mild  central venous pulmonary congestion. No pneumothorax or pleural fluid. No acute osseous abnormality.  IMPRESSION: Central venous pulmonary congestion.   Electronically Signed   By: Suzy Bouchard M.D.   On: 05/31/2014 20:38   US Renal  06/01/2014   CLINICAL DATA:  Acute renal failure, history of hypertension.  EXAM: RENAL / URINARY TRACT ULTRASOUND COMPLETE  COMPARISON:  None.  FINDINGS: Right Kidney:  Length: 13.1 cm. Echogenicity within normal limits. No mass or hydronephrosis visualized.  Left Kidney:  Length: 11.7 cm. Echogenicity within normal limits. No mass or hydronephrosis visualized.  Bladder:  Survey views of the urinary bladder normal. Bladder jets were not demonstrated.  IMPRESSION: Normal renal ultrasound examination.   Electronically Signed   By: David  Martinique M.D.   On: 06/01/2014 14:27   BACKGROUND  40 yr AAF with PMH recurrent MM lambda light chain (s/p BM transplant 15 yr ago, current chemo, pending repeat BM transplant this week), admitted for fever (100.82F) in setting of neutropenia and chemotherapy, found to have Acute Renal Failure with good UOP and no evidence volume overload, presumed d/t likely ATN with multifactorial insults (possible infection,  hypotension, with concomitant ARB and 2 diuretics (furosemide and HCTZ), possible volume depletion, without significant nephrotoxic chemo agents recently (Bortezomib, Pomalidomide), unlikely tumor lysis syndrome in MM, unlikely AIN with recent Cipro course.  1. ARF, in setting of prior normal kidney function - presumed ATN - baseline SCr around 0.8 to 1.0, recent gradual slight inc Cr for several days prior to major insult found at time of hospitalization. Initially from Cr 0.85 (4/22) to 1.1 (4/25), then again from 1.1 (4/27) to 1.4 (4/29) to 3.9 (5/1) - Elevated SCr 3.98, today. Continue to trend. - Good UOP, 800 cc in 24 hr, continue strict I/O - UA unremarkable without evidence of infection, RBCs, and nml spec grav 1.011 - Renal  US - normal without hydronephrosis - Continue to hold all nephrotoxic / anti-HTN meds (olmesartan-amlodipine-hctz, lasix) - Monitor Vanc levels if continued - Gentle IVF, PO diet - Anticipate gradual improvement of Cr, reassuring with good UOP - Recommend proceeding with Bone Marrow Transplant at [place] this week, and ARF would not be contraindication to proceeding, will need to be followed at [facility] if transferred / discharged - If renal function did not improve with removal of offending medications, gently hydration, time, would require renal bx to r/o myeloma involvement  2. Fever, low grade in setting of recent neutropenia - (improved) - Tmax 100.82F, stable low-grade temp 99.3-99.79F within past 12 hours without any anti-pyretic - Received BSA (Vanc / Zosyn) x 24 hours per primary team. - Follow cultures (blood x 2, 5/1 and urine 5/1) - currently without growth  3. Hypertension, currently with low BP, possible sepsis - On admission, lowest SBP 90s, normal BP range SBP 110-120s per office notes - Mild improvement with IVF - Holding anti-HTN agents. May continue Metoprolol. Avoid further injury to kidneys.  4. Multiple Myeloma, recurrent lambda chain - Oncology (Dr. Marin Olp) following, and in discussions with [facility] with plans to  proceed with [facility] transplant this week. Discussed with primary team Dr. Thereasa Solo, anticipate patient to be discharged vs transferred to [facility] to proceed with this therapy   Nobie Putnam, [place], PGY-2 (Currently working on [place] - Renal Service) 06/01/2014, 2:57 PM   I have seen and examined this patient and agree with plan and assessment in the above not by Dr. Parks Ranger with highlighted additions.  She is a 64 yo AAF with lambda light chain myeloma, currently relapsed, on bortezimib and pomalidomide since 09/2013, and being readied for stem cell transplant at [place]  (s/p cytoxan 4/20) with normal renal function at  baseline, admitted with fevers, relative hypotension and a creatinine that has increased from 0.85 (4/22) to 1.1 (4/25), then again from 1.1 (4/27) to 1.4 (4/29) to 3.9 (5/1). She is non-oliguric. UA is bland. Renal ultrasound shows 13.1 and 11.7 cm kidneys with normal echogenicity and no hydronephrosis.  Possibly hemodynamically mediated in the setting of fever, relative hypotension with ARB on board. Cannot r/o component of vol depletion (on both furosemide and HZTC). AIN from cipro in the DDX but only 1 week of Rx. Hopefully this will turn around with improvement in BP and elimination of potentially offending medications and some gentle IV hydration.     Ryelynn Guedea B,MD 06/01/2014 4:33 PM

## 2014-06-01 NOTE — Progress Notes (Signed)
B./P 97/35 (50) Pulse 96, Temp 99.9 orally.  Fredirick Maudlin, NP notified.  Orders given and carried out.  Will continue to monitor.

## 2014-06-02 ENCOUNTER — Telehealth: Payer: Self-pay | Admitting: *Deleted

## 2014-06-02 LAB — CALCIUM / CREATININE RATIO, URINE
Calcium, Ur: <0.8 mg/dL
Calcium/Creat.Ratio: <11 mg/g creat (ref 0–260)
Creatinine, Urine: 72.8 mg/dL (ref 15.0–278.0)

## 2014-06-02 LAB — URINE CULTURE: Colony Count: 25000

## 2014-06-02 LAB — HEMOGLOBIN A1C
Hgb A1c MFr Bld: 5.7 % — ABNORMAL HIGH (ref 4.8–5.6)
Mean Plasma Glucose: 117 mg/dL

## 2014-06-02 NOTE — Telephone Encounter (Signed)
Received critical value from [facility]. Blood culture drawn on 05/31/14 in the ED. Now with Gram Positive Rods.  Notified Dr Marin Olp, no orders received. Notified [facility], Rowe Robert, NP and they had already received notification. Patient is currently an inpatient at their facility.

## 2014-06-03 ENCOUNTER — Telehealth: Payer: Self-pay | Admitting: *Deleted

## 2014-06-03 NOTE — Telephone Encounter (Signed)
Microbiology report corrected to report that both blood culture bottles grew gram negative rods, not gram positive like previously reported. Dr Marin Olp notified.   Notified [facility], Rowe Robert, NP. Patient is currently an inpatient at their facility.

## 2014-06-04 LAB — CULTURE, BLOOD (ROUTINE X 2)

## 2014-06-09 ENCOUNTER — Other Ambulatory Visit: Payer: Self-pay | Admitting: *Deleted

## 2014-06-09 DIAGNOSIS — C9000 Multiple myeloma not having achieved remission: Secondary | ICD-10-CM

## 2014-06-12 ENCOUNTER — Other Ambulatory Visit (HOSPITAL_BASED_OUTPATIENT_CLINIC_OR_DEPARTMENT_OTHER): Payer: BC Managed Care – PPO

## 2014-06-12 DIAGNOSIS — C9002 Multiple myeloma in relapse: Secondary | ICD-10-CM

## 2014-06-12 DIAGNOSIS — C9000 Multiple myeloma not having achieved remission: Secondary | ICD-10-CM

## 2014-06-12 LAB — CMP (CANCER CENTER ONLY)
ALT(SGPT): 27 U/L (ref 10–47)
AST: 16 U/L (ref 11–38)
Albumin: 3.6 g/dL (ref 3.3–5.5)
Alkaline Phosphatase: 84 U/L (ref 26–84)
BUN, Bld: 25 mg/dL — ABNORMAL HIGH (ref 7–22)
CO2: 27 mEq/L (ref 18–33)
Calcium: 9.8 mg/dL (ref 8.0–10.3)
Chloride: 101 mEq/L (ref 98–108)
Creat: 1.3 mg/dl — ABNORMAL HIGH (ref 0.6–1.2)
Glucose, Bld: 92 mg/dL (ref 73–118)
Potassium: 3.3 mEq/L (ref 3.3–4.7)
Sodium: 142 mEq/L (ref 128–145)
Total Bilirubin: 0.6 mg/dl (ref 0.20–1.60)
Total Protein: 7.7 g/dL (ref 6.4–8.1)

## 2014-06-12 LAB — CBC WITH DIFFERENTIAL (CANCER CENTER ONLY)
BASO#: 0.1 10*3/uL (ref 0.0–0.2)
BASO%: 1.6 % (ref 0.0–2.0)
EOS%: 0.3 % (ref 0.0–7.0)
Eosinophils Absolute: 0 10*3/uL (ref 0.0–0.5)
HCT: 37.1 % (ref 34.8–46.6)
HGB: 12.7 g/dL (ref 11.6–15.9)
LYMPH#: 1.2 10*3/uL (ref 0.9–3.3)
LYMPH%: 18.5 % (ref 14.0–48.0)
MCH: 26.8 pg (ref 26.0–34.0)
MCHC: 34.2 g/dL (ref 32.0–36.0)
MCV: 78 fL — ABNORMAL LOW (ref 81–101)
MONO#: 1 10*3/uL — ABNORMAL HIGH (ref 0.1–0.9)
MONO%: 16.3 % — ABNORMAL HIGH (ref 0.0–13.0)
NEUT#: 3.9 10*3/uL (ref 1.5–6.5)
NEUT%: 63.3 % (ref 39.6–80.0)
Platelets: 354 10*3/uL (ref 145–400)
RBC: 4.73 10*6/uL (ref 3.70–5.32)
RDW: 16.7 % — ABNORMAL HIGH (ref 11.1–15.7)
WBC: 6.2 10*3/uL (ref 3.9–10.0)

## 2014-06-26 ENCOUNTER — Other Ambulatory Visit (HOSPITAL_BASED_OUTPATIENT_CLINIC_OR_DEPARTMENT_OTHER): Payer: BC Managed Care – PPO

## 2014-06-26 ENCOUNTER — Other Ambulatory Visit: Payer: Self-pay | Admitting: Nurse Practitioner

## 2014-06-26 DIAGNOSIS — C9000 Multiple myeloma not having achieved remission: Secondary | ICD-10-CM

## 2014-06-26 LAB — CBC WITH DIFFERENTIAL (CANCER CENTER ONLY)
BASO#: 0.1 10*3/uL (ref 0.0–0.2)
BASO%: 0.9 % (ref 0.0–2.0)
EOS%: 2.6 % (ref 0.0–7.0)
Eosinophils Absolute: 0.2 10*3/uL (ref 0.0–0.5)
HCT: 30 % — ABNORMAL LOW (ref 34.8–46.6)
HGB: 10.3 g/dL — ABNORMAL LOW (ref 11.6–15.9)
LYMPH#: 0.9 10*3/uL (ref 0.9–3.3)
LYMPH%: 13.3 % — ABNORMAL LOW (ref 14.0–48.0)
MCH: 27.2 pg (ref 26.0–34.0)
MCHC: 34.3 g/dL (ref 32.0–36.0)
MCV: 79 fL — ABNORMAL LOW (ref 81–101)
MONO#: 0.7 10*3/uL (ref 0.1–0.9)
MONO%: 10.1 % (ref 0.0–13.0)
NEUT#: 4.7 10*3/uL (ref 1.5–6.5)
NEUT%: 73.1 % (ref 39.6–80.0)
Platelets: 244 10*3/uL (ref 145–400)
RBC: 3.79 10*6/uL (ref 3.70–5.32)
RDW: 16.4 % — ABNORMAL HIGH (ref 11.1–15.7)
WBC: 6.5 10*3/uL (ref 3.9–10.0)

## 2014-06-26 LAB — COMPREHENSIVE METABOLIC PANEL
ALT: 13 U/L (ref 0–35)
AST: 13 U/L (ref 0–37)
Albumin: 3.9 g/dL (ref 3.5–5.2)
Alkaline Phosphatase: 56 U/L (ref 39–117)
BUN: 21 mg/dL (ref 6–23)
CO2: 26 mEq/L (ref 19–32)
Calcium: 9.2 mg/dL (ref 8.4–10.5)
Chloride: 101 mEq/L (ref 96–112)
Creatinine, Ser: 1.15 mg/dL — ABNORMAL HIGH (ref 0.50–1.10)
Glucose, Bld: 98 mg/dL (ref 70–99)
Potassium: 3.2 mEq/L — ABNORMAL LOW (ref 3.5–5.3)
Sodium: 140 mEq/L (ref 135–145)
Total Bilirubin: 0.3 mg/dL (ref 0.2–1.2)
Total Protein: 6.1 g/dL (ref 6.0–8.3)

## 2014-07-09 ENCOUNTER — Other Ambulatory Visit: Payer: Self-pay | Admitting: Hematology & Oncology

## 2014-07-17 ENCOUNTER — Ambulatory Visit: Payer: BC Managed Care – PPO

## 2014-07-25 DIAGNOSIS — Z9481 Bone marrow transplant status: Secondary | ICD-10-CM | POA: Insufficient documentation

## 2014-08-10 ENCOUNTER — Telehealth: Payer: Self-pay | Admitting: Nurse Practitioner

## 2014-08-10 NOTE — Telephone Encounter (Signed)
~   Weekly CBC w/diff, CMP, Mg++ Please fax all results to [phone] ~Requires irradiated/filtered/LRD blood products hgb <8, Plt <10k. ~May give Neupogen 300 SQ prn for WBC <2000 ~If pt develpos any combo of Dyspne/shortness of breath/fever/chills/cough please obtain PFT with [facility] as part of work-up and call Rowe Robert, FNP [phone] or Kathryne Eriksson, RN @ [phone] for questions or concerns.

## 2014-08-14 ENCOUNTER — Other Ambulatory Visit: Payer: Self-pay

## 2014-08-14 DIAGNOSIS — C9000 Multiple myeloma not having achieved remission: Secondary | ICD-10-CM

## 2014-08-17 ENCOUNTER — Ambulatory Visit (HOSPITAL_BASED_OUTPATIENT_CLINIC_OR_DEPARTMENT_OTHER): Payer: BC Managed Care – PPO | Admitting: Hematology & Oncology

## 2014-08-17 ENCOUNTER — Other Ambulatory Visit (HOSPITAL_BASED_OUTPATIENT_CLINIC_OR_DEPARTMENT_OTHER): Payer: BC Managed Care – PPO

## 2014-08-17 ENCOUNTER — Encounter: Payer: Self-pay | Admitting: Hematology & Oncology

## 2014-08-17 ENCOUNTER — Telehealth: Payer: Self-pay | Admitting: *Deleted

## 2014-08-17 VITALS — BP 137/85 | HR 91 | Temp 98.2°F | Resp 18

## 2014-08-17 DIAGNOSIS — C9000 Multiple myeloma not having achieved remission: Secondary | ICD-10-CM | POA: Diagnosis not present

## 2014-08-17 LAB — CBC WITH DIFFERENTIAL (CANCER CENTER ONLY)
BASO#: 0 10*3/uL (ref 0.0–0.2)
BASO%: 0.6 % (ref 0.0–2.0)
EOS%: 0.8 % (ref 0.0–7.0)
Eosinophils Absolute: 0 10*3/uL (ref 0.0–0.5)
HCT: 29.5 % — ABNORMAL LOW (ref 34.8–46.6)
HGB: 10.1 g/dL — ABNORMAL LOW (ref 11.6–15.9)
LYMPH#: 0.6 10*3/uL — ABNORMAL LOW (ref 0.9–3.3)
LYMPH%: 16 % (ref 14.0–48.0)
MCH: 28.1 pg (ref 26.0–34.0)
MCHC: 34.2 g/dL (ref 32.0–36.0)
MCV: 82 fL (ref 81–101)
MONO#: 0.8 10*3/uL (ref 0.1–0.9)
MONO%: 23.2 % — ABNORMAL HIGH (ref 0.0–13.0)
NEUT#: 2.1 10*3/uL (ref 1.5–6.5)
NEUT%: 59.4 % (ref 39.6–80.0)
Platelets: 139 10*3/uL — ABNORMAL LOW (ref 145–400)
RBC: 3.59 10*6/uL — ABNORMAL LOW (ref 3.70–5.32)
RDW: 16.6 % — ABNORMAL HIGH (ref 11.1–15.7)
WBC: 3.6 10*3/uL — ABNORMAL LOW (ref 3.9–10.0)

## 2014-08-17 LAB — CMP (CANCER CENTER ONLY)
ALT(SGPT): 34 U/L (ref 10–47)
AST: 24 U/L (ref 11–38)
Albumin: 3.1 g/dL — ABNORMAL LOW (ref 3.3–5.5)
Alkaline Phosphatase: 51 U/L (ref 26–84)
BUN, Bld: 12 mg/dL (ref 7–22)
CO2: 24 mEq/L (ref 18–33)
Calcium: 9.4 mg/dL (ref 8.0–10.3)
Chloride: 107 mEq/L (ref 98–108)
Creat: 1.1 mg/dl (ref 0.6–1.2)
Glucose, Bld: 95 mg/dL (ref 73–118)
Potassium: 3.2 mEq/L — ABNORMAL LOW (ref 3.3–4.7)
Sodium: 139 mEq/L (ref 128–145)
Total Bilirubin: 0.5 mg/dl (ref 0.20–1.60)
Total Protein: 5.8 g/dL — ABNORMAL LOW (ref 6.4–8.1)

## 2014-08-17 LAB — MAGNESIUM (CC13): Magnesium: 1.8 mg/dl (ref 1.5–2.5)

## 2014-08-17 NOTE — Progress Notes (Signed)
Hematology and Oncology Follow Up Visit  Emily Greer [id number] 11/07/1950 64 y.o. 08/17/2014   Principle Diagnosis:   Recurrent lambda light chain myeloma  History of stage I endometrial carcinoma  Current Therapy:    Status post second autologous stem cell transplant on 07/24/2014  Aredia 60 mg IV Q. 3 month     Interim History:  [name] is back for followup. She finally had her transplant. This was her second transplant. This is done at [facility]. She got through this very nicely.  Before the transplant, she did have a bacterial blood infection. I think this was with high-dose Cytoxan. She got through this without difficulties. She has some transient renal insufficiency but this seemed to improve.  She has good on to [facility]. Her mother will have cataract surgery. I do not see any problems with her going down there.  She back to [facility] on August 2 for another consultation. Maybe they will discuss with her the role, if any, of maintenance therapy.  She does not have any problems with mouth sores. She has no rashes. She has no fever.  I think one issue is when she will be able to see her grandchild. Her son and his wife following moved back to this side of the [place]. She really wants to see him. However, I think with that he got live vaccines might be an issue.  She's had no problems with bowels or bladder.  There's been no leg swelling..   Overall, her performance status is ECOG 1 Medications: Allergies:  Allergies  Allergen Reactions  . Codeine Nausea Only    Past Medical History, Surgical history, Social history, and Family History were reviewed and updated.  Review of Systems: As above  Physical Exam:  oral temperature is 98.2 F (36.8 C). Her blood pressure is 137/85 and her pulse is 91. Her respiration is 18.   Well-developed and well-nourished [name]. Head and neck exam shows no ocular or oral lesion. There are no  palpable cervical or supraclavicular lymph nodes. Lungs are clear. Cardiac exam regular rate and rhythm with no murmurs rubs or bruits. Abdomen is soft. She has good bowel sounds. There is no fluid wave. There is no palpable liver or spleen tip. Extremities shows no clubbing, cyanosis or edema. Neurological exam shows no focal neurological deficits. Skin exam no rashes, ecchymosis or petechia.  Lab Results  Component Value Date   WBC 3.6* 08/17/2014   HGB 10.1* 08/17/2014   HCT 29.5* 08/17/2014   MCV 82 08/17/2014   PLT 139* 08/17/2014     Chemistry      Component Value Date/Time   NA 140 06/26/2014 1349   NA 142 06/12/2014 1436   NA 141 09/04/2013 1049   K 3.2* 06/26/2014 1349   K 3.3 06/12/2014 1436   K 4.0 09/04/2013 1049   CL 101 06/26/2014 1349   CL 101 06/12/2014 1436   CO2 26 06/26/2014 1349   CO2 27 06/12/2014 1436   CO2 27 09/04/2013 1049   BUN 21 06/26/2014 1349   BUN 25* 06/12/2014 1436   BUN 18.3 09/04/2013 1049   CREATININE 1.15* 06/26/2014 1349   CREATININE 1.3* 06/12/2014 1436   CREATININE 0.9 09/04/2013 1049      Component Value Date/Time   CALCIUM 9.2 06/26/2014 1349   CALCIUM 9.8 06/12/2014 1436   CALCIUM 9.8 09/04/2013 1049   ALKPHOS 56 06/26/2014 1349   ALKPHOS 84 06/12/2014 1436   ALKPHOS 45 09/04/2013  1049   AST 13 06/26/2014 1349   AST 16 06/12/2014 1436   AST 12 09/04/2013 1049   ALT 13 06/26/2014 1349   ALT 27 06/12/2014 1436   ALT 17 09/04/2013 1049   BILITOT 0.3 06/26/2014 1349   BILITOT 0.60 06/12/2014 1436   BILITOT 0.47 09/04/2013 1049         Impression and Plan: Emily Greer is 64 year old female. She has recurrent lambda light chain myeloma.  She really looks good. She had her transplant on June 24. She really had no compensations from the transplant.  I think the real question is whether or not she needs to be on any maintenance therapy. Since this is her second transplant, a much or what the role of maintenance therapy would  be. She sees the transplant doctors at [facility] on August 2.  I want to see her back afterwards.  I think we will have to continue her on the Aredia. I think we do this every 3 months.  I spent about 30 minutes with her and her husband today  [name], MD 7/18/20168:53 AM

## 2014-08-17 NOTE — Telephone Encounter (Addendum)
Patient aware of results. Will follow instructions.   ----- Message from [name], MD sent at 08/17/2014  3:58 PM EDT ----- Call - chemistry studies look ok!!!  Potassium a little low, but just have bananas or tomatoes!!  pete

## 2014-09-09 ENCOUNTER — Encounter: Payer: Self-pay | Admitting: Hematology & Oncology

## 2014-09-09 ENCOUNTER — Other Ambulatory Visit (HOSPITAL_BASED_OUTPATIENT_CLINIC_OR_DEPARTMENT_OTHER): Payer: BC Managed Care – PPO

## 2014-09-09 ENCOUNTER — Ambulatory Visit (HOSPITAL_BASED_OUTPATIENT_CLINIC_OR_DEPARTMENT_OTHER): Payer: BC Managed Care – PPO | Admitting: Hematology & Oncology

## 2014-09-09 VITALS — BP 152/88 | HR 93 | Temp 97.8°F | Resp 20 | Ht 64.0 in | Wt 183.0 lb

## 2014-09-09 DIAGNOSIS — C9000 Multiple myeloma not having achieved remission: Secondary | ICD-10-CM

## 2014-09-09 DIAGNOSIS — Z9484 Stem cells transplant status: Secondary | ICD-10-CM | POA: Diagnosis not present

## 2014-09-09 DIAGNOSIS — Z8542 Personal history of malignant neoplasm of other parts of uterus: Secondary | ICD-10-CM | POA: Diagnosis not present

## 2014-09-09 DIAGNOSIS — I10 Essential (primary) hypertension: Secondary | ICD-10-CM

## 2014-09-09 LAB — CBC WITH DIFFERENTIAL (CANCER CENTER ONLY)
BASO#: 0 10*3/uL (ref 0.0–0.2)
BASO%: 0.5 % (ref 0.0–2.0)
EOS%: 2.8 % (ref 0.0–7.0)
Eosinophils Absolute: 0.2 10*3/uL (ref 0.0–0.5)
HCT: 33 % — ABNORMAL LOW (ref 34.8–46.6)
HGB: 11.2 g/dL — ABNORMAL LOW (ref 11.6–15.9)
LYMPH#: 0.8 10*3/uL — ABNORMAL LOW (ref 0.9–3.3)
LYMPH%: 14.5 % (ref 14.0–48.0)
MCH: 28.3 pg (ref 26.0–34.0)
MCHC: 33.9 g/dL (ref 32.0–36.0)
MCV: 83 fL (ref 81–101)
MONO#: 0.7 10*3/uL (ref 0.1–0.9)
MONO%: 12 % (ref 0.0–13.0)
NEUT#: 4 10*3/uL (ref 1.5–6.5)
NEUT%: 70.2 % (ref 39.6–80.0)
Platelets: 233 10*3/uL (ref 145–400)
RBC: 3.96 10*6/uL (ref 3.70–5.32)
RDW: 15.8 % — ABNORMAL HIGH (ref 11.1–15.7)
WBC: 5.7 10*3/uL (ref 3.9–10.0)

## 2014-09-09 LAB — COMPREHENSIVE METABOLIC PANEL (CC13)
ALT: 18 U/L (ref 0–55)
AST: 18 U/L (ref 5–34)
Albumin: 3.9 g/dL (ref 3.5–5.0)
Alkaline Phosphatase: 62 U/L (ref 40–150)
Anion Gap: 9 mEq/L (ref 3–11)
BUN: 13.5 mg/dL (ref 7.0–26.0)
CO2: 23 mEq/L (ref 22–29)
Calcium: 9.7 mg/dL (ref 8.4–10.4)
Chloride: 109 mEq/L (ref 98–109)
Creatinine: 0.8 mg/dL (ref 0.6–1.1)
EGFR: >90 mL/min/{1.73_m2} (ref >90)
Glucose: 96 mg/dl (ref 70–140)
Potassium: 3.3 mEq/L — ABNORMAL LOW (ref 3.5–5.1)
Sodium: 142 mEq/L (ref 136–145)
Total Bilirubin: 0.32 mg/dL (ref 0.20–1.20)
Total Protein: 6.4 g/dL (ref 6.4–8.3)

## 2014-09-09 LAB — MAGNESIUM (CC13): Magnesium: 1.8 mg/dl (ref 1.5–2.5)

## 2014-09-09 MED ORDER — TRIAMTERENE-HCTZ 37.5-25 MG PO TABS: 1.0000 | ORAL_TABLET | Freq: Every day | ORAL | Status: DC

## 2014-09-09 NOTE — Progress Notes (Signed)
Hematology and Oncology Follow Up Visit  RAQUEAL MARIETTA [id number] 03-22-1950 64 y.o. 09/09/2014   Principle Diagnosis:   Recurrent lambda light chain myeloma  History of stage I endometrial carcinoma  Current Therapy:    Status post second autologous stem cell transplant on 07/24/2014  Aredia 60 mg IV Q. 3 month     Interim History:  Ms.  Surrency is back for followup. She really looks good. She is very excited that she is able to see her grand son for the first time. This was while they were down in [place].  She saw Dr. Samule Ohm at [facility] last week. Everything was going quite well. When she sees her back in 6 weeks, she will discuss maintenance therapy with her.  She's had no problems with fever. She's had no issues with bowels or bladder. She's had some slight leg swelling. She's had some blood pressure issues. Dr. Samule Ohm put her on some amlodipine.  She's had no issues with cough or shortness of breath.  She's had no rashes.  Overall, her performance status is ECOG 1.   Medications: Allergies:  Allergies  Allergen Reactions  . Codeine Nausea Only    Past Medical History, Surgical history, Social history, and Family History were reviewed and updated.  Review of Systems: As above  Physical Exam:  height is 5\' 4"  (1.626 m) and weight is 183 lb (83.008 kg). Her oral temperature is 97.8 F (36.6 C). Her blood pressure is 152/88 and her pulse is 93. Her respiration is 20.   Well-developed and well-nourished [name]. Head and neck exam shows no ocular or oral lesion. There are no palpable cervical or supraclavicular lymph nodes. Lungs are clear. Cardiac exam regular rate and rhythm with no murmurs rubs or bruits. Abdomen is soft. She has good bowel sounds. There is no fluid wave. There is no palpable liver or spleen tip. Extremities shows no clubbing, cyanosis or edema. Neurological exam shows no focal neurological deficits. Skin exam no rashes,  ecchymosis or petechia.  Lab Results  Component Value Date   WBC 5.7 09/09/2014   HGB 11.2* 09/09/2014   HCT 33.0* 09/09/2014   MCV 83 09/09/2014   PLT 233 09/09/2014     Chemistry      Component Value Date/Time   NA 139 08/17/2014 0823   NA 140 06/26/2014 1349   NA 141 09/04/2013 1049   K 3.2* 08/17/2014 0823   K 3.2* 06/26/2014 1349   K 4.0 09/04/2013 1049   CL 107 08/17/2014 0823   CL 101 06/26/2014 1349   CO2 24 08/17/2014 0823   CO2 26 06/26/2014 1349   CO2 27 09/04/2013 1049   BUN 12 08/17/2014 0823   BUN 21 06/26/2014 1349   BUN 18.3 09/04/2013 1049   CREATININE 1.1 08/17/2014 0823   CREATININE 1.15* 06/26/2014 1349   CREATININE 0.9 09/04/2013 1049      Component Value Date/Time   CALCIUM 9.4 08/17/2014 0823   CALCIUM 9.2 06/26/2014 1349   CALCIUM 9.8 09/04/2013 1049   ALKPHOS 51 08/17/2014 0823   ALKPHOS 56 06/26/2014 1349   ALKPHOS 45 09/04/2013 1049   AST 24 08/17/2014 0823   AST 13 06/26/2014 1349   AST 12 09/04/2013 1049   ALT 34 08/17/2014 0823   ALT 13 06/26/2014 1349   ALT 17 09/04/2013 1049   BILITOT 0.50 08/17/2014 0823   BILITOT 0.3 06/26/2014 1349   BILITOT 0.47 09/04/2013 1049  Impression and Plan: Ms. Sepulvado is 64 year old female. She has recurrent lambda light chain myeloma.  She really looks good. She had her transplant on June 24. She really had no complications from the transplant.  I wish to go back to see the transplant doctors at [facility] in 6 weeks, they will talk her about maintenance therapy. I think that she probably would do well with maintenance therapy.  I will order some Maxide (37.5/25) to help with the blood pressure and also for some of the edema in her legs.  I will plan to see her back in about 2 months now. We'll see her back, we will get her restarted on Aredia.   I spent about 30 minutes with her today   . [name], MD 8/10/20169:29 AM

## 2014-09-11 LAB — KAPPA/LAMBDA LIGHT CHAINS
Kappa free light chain: 0.13 mg/dL — ABNORMAL LOW (ref 0.33–1.94)
Kappa:Lambda Ratio: 0.14 — ABNORMAL LOW (ref 0.26–1.65)
Lambda Free Lght Chn: 0.94 mg/dL (ref 0.57–2.63)

## 2014-09-18 LAB — UIFE/LIGHT CHAINS/TP QN, 24-HR UR
Albumin, U: DETECTED
Alpha 1, Urine: DETECTED — AB
Alpha 2, Urine: DETECTED — AB
Beta, Urine: DETECTED — AB
Gamma Globulin, Urine: DETECTED — AB
Time: 24 hours
Total Protein, Urine: 6 mg/dL (ref 5–24)
Volume, Urine: 1300 mL

## 2014-09-18 LAB — 24 HR URINE,KAPPA/LAMBDA LIGHT CHAINS
24H Urine Volume: 1300 mL/24 h
Measured Kappa Chain: <0.4 mg/dL (ref <2.00)
Measured Lambda Chain: <0.4 mg/dL (ref <2.00)

## 2014-09-22 ENCOUNTER — Telehealth: Payer: Self-pay | Admitting: *Deleted

## 2014-09-22 NOTE — Telephone Encounter (Addendum)
Patient aware of results  ----- Message from [name], MD sent at 09/18/2014  7:26 AM EDT ----- Call - urine does not show any myeloma protein!!! pete

## 2014-11-03 ENCOUNTER — Ambulatory Visit (HOSPITAL_BASED_OUTPATIENT_CLINIC_OR_DEPARTMENT_OTHER): Payer: BC Managed Care – PPO

## 2014-11-03 ENCOUNTER — Ambulatory Visit (HOSPITAL_BASED_OUTPATIENT_CLINIC_OR_DEPARTMENT_OTHER): Payer: BC Managed Care – PPO | Admitting: Hematology & Oncology

## 2014-11-03 ENCOUNTER — Encounter: Payer: Self-pay | Admitting: Hematology & Oncology

## 2014-11-03 ENCOUNTER — Other Ambulatory Visit: Payer: Self-pay | Admitting: *Deleted

## 2014-11-03 VITALS — BP 99/74 | HR 90 | Temp 98.0°F | Resp 16 | Ht 64.0 in | Wt 182.0 lb

## 2014-11-03 DIAGNOSIS — C9000 Multiple myeloma not having achieved remission: Secondary | ICD-10-CM

## 2014-11-03 DIAGNOSIS — Z9484 Stem cells transplant status: Secondary | ICD-10-CM | POA: Diagnosis not present

## 2014-11-03 DIAGNOSIS — C9002 Multiple myeloma in relapse: Secondary | ICD-10-CM

## 2014-11-03 DIAGNOSIS — I10 Essential (primary) hypertension: Secondary | ICD-10-CM

## 2014-11-03 DIAGNOSIS — Z8579 Personal history of other malignant neoplasms of lymphoid, hematopoietic and related tissues: Secondary | ICD-10-CM

## 2014-11-03 LAB — CBC WITH DIFFERENTIAL (CANCER CENTER ONLY)
BASO#: 0 10*3/uL (ref 0.0–0.2)
BASO%: 0.6 % (ref 0.0–2.0)
EOS%: 3.7 % (ref 0.0–7.0)
Eosinophils Absolute: 0.2 10*3/uL (ref 0.0–0.5)
HCT: 34.5 % — ABNORMAL LOW (ref 34.8–46.6)
HGB: 11.4 g/dL — ABNORMAL LOW (ref 11.6–15.9)
LYMPH#: 0.9 10*3/uL (ref 0.9–3.3)
LYMPH%: 17.9 % (ref 14.0–48.0)
MCH: 26.7 pg (ref 26.0–34.0)
MCHC: 33 g/dL (ref 32.0–36.0)
MCV: 81 fL (ref 81–101)
MONO#: 0.5 10*3/uL (ref 0.1–0.9)
MONO%: 10.5 % (ref 0.0–13.0)
NEUT#: 3.3 10*3/uL (ref 1.5–6.5)
NEUT%: 67.3 % (ref 39.6–80.0)
Platelets: 221 10*3/uL (ref 145–400)
RBC: 4.27 10*6/uL (ref 3.70–5.32)
RDW: 15.6 % (ref 11.1–15.7)
WBC: 4.9 10*3/uL (ref 3.9–10.0)

## 2014-11-03 LAB — MAGNESIUM (CC13): Magnesium: 2 mg/dl (ref 1.5–2.5)

## 2014-11-03 MED ORDER — POMALIDOMIDE 1 MG PO CAPS: ORAL_CAPSULE | ORAL | Status: DC

## 2014-11-03 MED ORDER — SODIUM CHLORIDE 0.9 % IV SOLN
INTRAVENOUS | Status: DC
  Administered 2014-11-03: 12:00:00 via INTRAVENOUS

## 2014-11-03 MED ORDER — POMALIDOMIDE 1 MG PO CAPS: 1.0000 mg | ORAL_CAPSULE | Freq: Every day | ORAL | Status: DC

## 2014-11-03 MED ORDER — SODIUM CHLORIDE 0.9 % IV SOLN
90.0000 mg | Freq: Once | INTRAVENOUS | Status: AC
  Administered 2014-11-03: 90 mg via INTRAVENOUS
  Filled 2014-11-03: qty 10

## 2014-11-03 NOTE — Patient Instructions (Signed)
Pamidronate injection What is this medicine? PAMIDRONATE (pa mi DROE nate) slows calcium loss from bones. It is used to treat high calcium blood levels from cancer or Paget's disease. It is also used to treat bone pain and prevent fractures from certain cancers that have spread to the bone. This medicine may be used for other purposes; ask your health care provider or pharmacist if you have questions. COMMON BRAND NAME(S): Aredia What should I tell my health care provider before I take this medicine? They need to know if you have any of these conditions: -aspirin-sensitive asthma -dental disease -kidney disease -an unusual or allergic reaction to pamidronate, other medicines, foods, dyes, or preservatives -pregnant or trying to get pregnant -breast-feeding How should I use this medicine? This medicine is for infusion into a vein. It is given by a health care professional in a hospital or clinic setting. Talk to your pediatrician regarding the use of this medicine in children. This medicine is not approved for use in children. Overdosage: If you think you have taken too much of this medicine contact a poison control center or emergency room at once. NOTE: This medicine is only for you. Do not share this medicine with others. What if I miss a dose? This does not apply. What may interact with this medicine? -certain antibiotics given by injection -medicines for inflammation or pain like ibuprofen, naproxen -some diuretics like bumetanide, furosemide -cyclosporine -parathyroid hormone -tacrolimus -teriparatide -thalidomide This list may not describe all possible interactions. Give your health care provider a list of all the medicines, herbs, non-prescription drugs, or dietary supplements you use. Also tell them if you smoke, drink alcohol, or use illegal drugs. Some items may interact with your medicine. What should I watch for while using this medicine? Visit your doctor or health care  professional for regular checkups. It may be some time before you see the benefit from this medicine. Do not stop taking your medicine unless your doctor tells you to. Your doctor may order blood tests or other tests to see how you are doing. Women should inform their doctor if they wish to become pregnant or think they might be pregnant. There is a potential for serious side effects to an unborn child. Talk to your health care professional or pharmacist for more information. You should make sure that you get enough calcium and vitamin D while you are taking this medicine. Discuss the foods you eat and the vitamins you take with your health care professional. Some people who take this medicine have severe bone, joint, and/or muscle pain. This medicine may also increase your risk for a broken thigh bone. Tell your doctor right away if you have pain in your upper leg or groin. Tell your doctor if you have any pain that does not go away or that gets worse. What side effects may I notice from receiving this medicine? Side effects that you should report to your doctor or health care professional as soon as possible: -allergic reactions like skin rash, itching or hives, swelling of the face, lips, or tongue -black or tarry stools -changes in vision -eye inflammation, pain -high blood pressure -jaw pain, especially burning or cramping -muscle weakness -numb, tingling pain -swelling of feet or hands -trouble passing urine or change in the amount of urine -unable to move easily Side effects that usually do not require medical attention (report to your doctor or health care professional if they continue or are bothersome): -bone, joint, or muscle pain -constipation -dizzy, drowsy -  fever -headache -loss of appetite -nausea, vomiting -pain at site where injected This list may not describe all possible side effects. Call your doctor for medical advice about side effects. You may report side effects to  FDA at 1-800-FDA-1088. Where should I keep my medicine? This drug is given in a hospital or clinic and will not be stored at home. NOTE: This sheet is a summary. It may not cover all possible information. If you have questions about this medicine, talk to your doctor, pharmacist, or health care provider.  2015, [employer]vier/Gold Standard. (2010-07-15 08:49:49)

## 2014-11-03 NOTE — Progress Notes (Signed)
Hematology and Oncology Follow Up Visit  Emily Greer [id number] 06-30-1950 64 y.o. 11/03/2014   Principle Diagnosis:   Recurrent lambda light chain myeloma  History of stage I endometrial carcinoma  Current Therapy:    Status post second autologous stem cell transplant on 07/24/2014  Aredia 60 mg IV Q. 3 month     Interim History:  Ms.  Emily Greer is back for followup. She really looks good. She continues to recover well from the transplant. Her transplant was back in mid June.  We will now get her on maintenance therapy. Pomalidomide and Velcade will be the protocol. I think this is very reasonable.  She's had no problems with nausea or vomiting. She's had no problems with bowels or bladder. She's had no rashes. She's had no leg swelling.  She is happy that she does not have to take any steroids.  Her last lambda light chain was 0.94 mg/dL. This is back in August.  She did see Dr. Alvie Greer at [facility] on September 15. She talked to [name] about maintenance therapy.  Overall, her performance status is ECOG 1.   Medications: Allergies:  Allergies  Allergen Reactions  . Codeine Nausea Only    Past Medical History, Surgical history, Social history, and Family History were reviewed and updated.  Review of Systems: As above  Physical Exam:  height is 5\' 4"  (1.626 m) and weight is 182 lb (82.555 kg). Her oral temperature is 98 F (36.7 C). Her blood pressure is 99/74 and her pulse is 90. Her respiration is 16.   Well-developed and well-nourished [name]. Head and neck exam shows no ocular or oral lesion. There are no palpable cervical or supraclavicular lymph nodes. Lungs are clear. Cardiac exam regular rate and rhythm with no murmurs rubs or bruits. Abdomen is soft. She has good bowel sounds. There is no fluid wave. There is no palpable liver or spleen tip. Extremities shows no clubbing, cyanosis or edema. Neurological exam shows no focal neurological  deficits. Skin exam no rashes, ecchymosis or petechia.  Lab Results  Component Value Date   WBC 4.9 11/03/2014   HGB 11.4* 11/03/2014   HCT 34.5* 11/03/2014   MCV 81 11/03/2014   PLT 221 11/03/2014     Chemistry      Component Value Date/Time   NA 142 09/09/2014 0824   NA 139 08/17/2014 0823   NA 140 06/26/2014 1349   K 3.3* 09/09/2014 0824   K 3.2* 08/17/2014 0823   K 3.2* 06/26/2014 1349   CL 107 08/17/2014 0823   CL 101 06/26/2014 1349   CO2 23 09/09/2014 0824   CO2 24 08/17/2014 0823   CO2 26 06/26/2014 1349   BUN 13.5 09/09/2014 0824   BUN 12 08/17/2014 0823   BUN 21 06/26/2014 1349   CREATININE 0.8 09/09/2014 0824   CREATININE 1.1 08/17/2014 0823   CREATININE 1.15* 06/26/2014 1349      Component Value Date/Time   CALCIUM 9.7 09/09/2014 0824   CALCIUM 9.4 08/17/2014 0823   CALCIUM 9.2 06/26/2014 1349   ALKPHOS 62 09/09/2014 0824   ALKPHOS 51 08/17/2014 0823   ALKPHOS 56 06/26/2014 1349   AST 18 09/09/2014 0824   AST 24 08/17/2014 0823   AST 13 06/26/2014 1349   ALT 18 09/09/2014 0824   ALT 34 08/17/2014 0823   ALT 13 06/26/2014 1349   BILITOT 0.32 09/09/2014 0824   BILITOT 0.50 08/17/2014 0823   BILITOT 0.3 06/26/2014 1349  Impression and Plan: Emily Greer is 64 year old female. She has recurrent lambda light chain myeloma.  She really looks good. She had her transplant on June 24. She really had no complications from the transplant.  We will now get her started on maintenance therapy. His pain with Dr. Alvie Greer, she feels that low-dose Pomalidomide and every other week Velcade would be appropriate. I think this makes sense.  We will get her started on Pomalidomide at 1 mg a day for 21 days on and 7 days off.  She will continue the Aredia every 3 months. I think this is reasonable.   I spent about 30 minutes with her today   . [name], MD 10/4/201611:30 AM

## 2014-11-05 ENCOUNTER — Ambulatory Visit: Payer: BC Managed Care – PPO | Admitting: Radiation Oncology

## 2014-11-05 LAB — PROTEIN ELECTROPHORESIS, SERUM, WITH REFLEX
Albumin ELP: 4.1 g/dL (ref 3.8–4.8)
Alpha-1-Globulin: 0.3 g/dL (ref 0.2–0.3)
Alpha-2-Globulin: 0.8 g/dL (ref 0.5–0.9)
Beta 2: 0.3 g/dL (ref 0.2–0.5)
Beta Globulin: 0.5 g/dL (ref 0.4–0.6)
Gamma Globulin: 0.6 g/dL — ABNORMAL LOW (ref 0.8–1.7)
Total Protein, Serum Electrophoresis: 6.5 g/dL (ref 6.1–8.1)

## 2014-11-05 LAB — KAPPA/LAMBDA LIGHT CHAINS
Kappa free light chain: 0.94 mg/dL (ref 0.33–1.94)
Kappa:Lambda Ratio: 0.39 (ref 0.26–1.65)
Lambda Free Lght Chn: 2.38 mg/dL (ref 0.57–2.63)

## 2014-11-05 LAB — IGG, IGA, IGM
IgA: 79 mg/dL (ref 69–380)
IgG (Immunoglobin G), Serum: 548 mg/dL — ABNORMAL LOW (ref 690–1700)
IgM, Serum: 47 mg/dL — ABNORMAL LOW (ref 52–322)

## 2014-11-05 LAB — LACTATE DEHYDROGENASE: LDH: 142 U/L (ref 94–250)

## 2014-11-05 LAB — IFE INTERPRETATION

## 2014-11-09 ENCOUNTER — Ambulatory Visit (HOSPITAL_BASED_OUTPATIENT_CLINIC_OR_DEPARTMENT_OTHER): Payer: BC Managed Care – PPO

## 2014-11-09 VITALS — BP 137/74 | HR 100 | Temp 98.9°F | Resp 18

## 2014-11-09 DIAGNOSIS — C9000 Multiple myeloma not having achieved remission: Secondary | ICD-10-CM

## 2014-11-09 DIAGNOSIS — Z5112 Encounter for antineoplastic immunotherapy: Secondary | ICD-10-CM | POA: Diagnosis not present

## 2014-11-09 MED ORDER — BORTEZOMIB CHEMO SQ INJECTION 3.5 MG (2.5MG/ML)
1.3000 mg/m2 | Freq: Once | INTRAMUSCULAR | Status: AC
  Administered 2014-11-09: 2.5 mg via SUBCUTANEOUS
  Filled 2014-11-09: qty 2.5

## 2014-11-09 MED ORDER — ONDANSETRON HCL 8 MG PO TABS
8.0000 mg | ORAL_TABLET | Freq: Once | ORAL | Status: AC
  Administered 2014-11-09: 8 mg via ORAL

## 2014-11-09 MED ORDER — BORTEZOMIB CHEMO SQ INJECTION 3.5 MG (2.5MG/ML): 1.3000 mg/m2 | Freq: Once | INTRAMUSCULAR | Status: DC

## 2014-11-09 MED ORDER — ONDANSETRON HCL 8 MG PO TABS
ORAL_TABLET | ORAL | Status: AC
Start: 2014-11-09 — End: 2014-11-09
  Filled 2014-11-09: qty 1

## 2014-11-09 NOTE — Patient Instructions (Signed)
Bortezomib injection What is this medicine? BORTEZOMIB (bor TEZ oh mib) is a medicine that targets proteins in cancer cells and stops the cancer cells from growing. It is used to treat multiple myeloma and mantle-cell lymphoma. This medicine may be used for other purposes; ask your health care provider or pharmacist if you have questions. What should I tell my health care provider before I take this medicine? They need to know if you have any of these conditions: -diabetes -heart disease -irregular heartbeat -liver disease -on hemodialysis -low blood counts, like low white blood cells, platelets, or hemoglobin -peripheral neuropathy -taking medicine for blood pressure -an unusual or allergic reaction to bortezomib, mannitol, boron, other medicines, foods, dyes, or preservatives -pregnant or trying to get pregnant -breast-feeding How should I use this medicine? This medicine is for injection into a vein or for injection under the skin. It is given by a health care professional in a hospital or clinic setting. Talk to your pediatrician regarding the use of this medicine in children. Special care may be needed. Overdosage: If you think you have taken too much of this medicine contact a poison control center or emergency room at once. NOTE: This medicine is only for you. Do not share this medicine with others. What if I miss a dose? It is important not to miss your dose. Call your doctor or health care professional if you are unable to keep an appointment. What may interact with this medicine? This medicine may interact with the following medications: -ketoconazole -rifampin -ritonavir -St. John's Wort This list may not describe all possible interactions. Give your health care provider a list of all the medicines, herbs, non-prescription drugs, or dietary supplements you use. Also tell them if you smoke, drink alcohol, or use illegal drugs. Some items may interact with your medicine. What  should I watch for while using this medicine? Visit your doctor for checks on your progress. This drug may make you feel generally unwell. This is not uncommon, as chemotherapy can affect healthy cells as well as cancer cells. Report any side effects. Continue your course of treatment even though you feel ill unless your doctor tells you to stop. You may get drowsy or dizzy. Do not drive, use machinery, or do anything that needs mental alertness until you know how this medicine affects you. Do not stand or sit up quickly, especially if you are an older patient. This reduces the risk of dizzy or fainting spells. In some cases, you may be given additional medicines to help with side effects. Follow all directions for their use. Call your doctor or health care professional for advice if you get a fever, chills or sore throat, or other symptoms of a cold or flu. Do not treat yourself. This drug decreases your body's ability to fight infections. Try to avoid being around people who are sick. This medicine may increase your risk to bruise or bleed. Call your doctor or health care professional if you notice any unusual bleeding. You may need blood work done while you are taking this medicine. In some patients, this medicine may cause a serious brain infection that may cause death. If you have any problems seeing, thinking, speaking, walking, or standing, tell your doctor right away. If you cannot reach your doctor, urgently seek other source of medical care. Do not become pregnant while taking this medicine. Women should inform their doctor if they wish to become pregnant or think they might be pregnant. There is a potential for serious  side effects to an unborn child. Talk to your health care professional or pharmacist for more information. Do not breast-feed an infant while taking this medicine. Check with your doctor or health care professional if you get an attack of severe diarrhea, nausea and vomiting, or if  you sweat a lot. The loss of too much body fluid can make it dangerous for you to take this medicine. What side effects may I notice from receiving this medicine? Side effects that you should report to your doctor or health care professional as soon as possible: -allergic reactions like skin rash, itching or hives, swelling of the face, lips, or tongue -breathing problems -changes in hearing -changes in vision -fast, irregular heartbeat -feeling faint or lightheaded, falls -pain, tingling, numbness in the hands or feet -right upper belly pain -seizures -swelling of the ankles, feet, hands -unusual bleeding or bruising -unusually weak or tired -vomiting -yellowing of the eyes or skin Side effects that usually do not require medical attention (report to your doctor or health care professional if they continue or are bothersome): -changes in emotions or moods -constipation -diarrhea -loss of appetite -headache -irritation at site where injected -nausea This list may not describe all possible side effects. Call your doctor for medical advice about side effects. You may report side effects to FDA at 1-800-FDA-1088. Where should I keep my medicine? This drug is given in a hospital or clinic and will not be stored at home. NOTE: This sheet is a summary. It may not cover all possible information. If you have questions about this medicine, talk to your doctor, pharmacist, or health care provider.    2016, Elsevier/Gold Standard. (2014-03-17 14:47:04)

## 2014-11-11 ENCOUNTER — Ambulatory Visit: Payer: BC Managed Care – PPO | Admitting: Radiation Oncology

## 2014-11-23 ENCOUNTER — Ambulatory Visit (HOSPITAL_BASED_OUTPATIENT_CLINIC_OR_DEPARTMENT_OTHER): Payer: BC Managed Care – PPO

## 2014-11-23 VITALS — BP 124/70 | HR 85 | Temp 98.3°F | Resp 18

## 2014-11-23 DIAGNOSIS — C9002 Multiple myeloma in relapse: Secondary | ICD-10-CM

## 2014-11-23 DIAGNOSIS — C9000 Multiple myeloma not having achieved remission: Secondary | ICD-10-CM

## 2014-11-23 DIAGNOSIS — Z5112 Encounter for antineoplastic immunotherapy: Secondary | ICD-10-CM

## 2014-11-23 LAB — CBC WITH DIFFERENTIAL (CANCER CENTER ONLY)
BASO#: 0 10*3/uL (ref 0.0–0.2)
BASO%: 0.9 % (ref 0.0–2.0)
EOS%: 4.1 % (ref 0.0–7.0)
Eosinophils Absolute: 0.2 10*3/uL (ref 0.0–0.5)
HCT: 33.7 % — ABNORMAL LOW (ref 34.8–46.6)
HGB: 11.2 g/dL — ABNORMAL LOW (ref 11.6–15.9)
LYMPH#: 0.9 10*3/uL (ref 0.9–3.3)
LYMPH%: 20.2 % (ref 14.0–48.0)
MCH: 26.9 pg (ref 26.0–34.0)
MCHC: 33.2 g/dL (ref 32.0–36.0)
MCV: 81 fL (ref 81–101)
MONO#: 0.8 10*3/uL (ref 0.1–0.9)
MONO%: 18.1 % — ABNORMAL HIGH (ref 0.0–13.0)
NEUT#: 2.5 10*3/uL (ref 1.5–6.5)
NEUT%: 56.7 % (ref 39.6–80.0)
Platelets: 190 10*3/uL (ref 145–400)
RBC: 4.17 10*6/uL (ref 3.70–5.32)
RDW: 15.7 % (ref 11.1–15.7)
WBC: 4.4 10*3/uL (ref 3.9–10.0)

## 2014-11-23 LAB — CMP (CANCER CENTER ONLY)
ALT(SGPT): 19 U/L (ref 10–47)
AST: 23 U/L (ref 11–38)
Albumin: 3.7 g/dL (ref 3.3–5.5)
Alkaline Phosphatase: 47 U/L (ref 26–84)
BUN, Bld: 19 mg/dL (ref 7–22)
CO2: 26 mEq/L (ref 18–33)
Calcium: 9.7 mg/dL (ref 8.0–10.3)
Chloride: 102 mEq/L (ref 98–108)
Creat: 0.8 mg/dl (ref 0.6–1.2)
Glucose, Bld: 92 mg/dL (ref 73–118)
Potassium: 3.5 mEq/L (ref 3.3–4.7)
Sodium: 138 mEq/L (ref 128–145)
Total Bilirubin: 0.5 mg/dl (ref 0.20–1.60)
Total Protein: 6.8 g/dL (ref 6.4–8.1)

## 2014-11-23 MED ORDER — ONDANSETRON HCL 8 MG PO TABS
8.0000 mg | ORAL_TABLET | Freq: Once | ORAL | Status: AC
  Administered 2014-11-23: 8 mg via ORAL

## 2014-11-23 MED ORDER — ONDANSETRON HCL 8 MG PO TABS
ORAL_TABLET | ORAL | Status: AC
  Filled 2014-11-23: qty 1

## 2014-11-23 MED ORDER — BORTEZOMIB CHEMO SQ INJECTION 3.5 MG (2.5MG/ML)
2.5000 mg | Freq: Once | INTRAMUSCULAR | Status: AC
  Administered 2014-11-23: 2.5 mg via SUBCUTANEOUS
  Filled 2014-11-23: qty 2.5

## 2014-11-23 NOTE — Patient Instructions (Signed)
Bortezomib injection What is this medicine? BORTEZOMIB (bor TEZ oh mib) is a medicine that targets proteins in cancer cells and stops the cancer cells from growing. It is used to treat multiple myeloma and mantle-cell lymphoma. This medicine may be used for other purposes; ask your health care provider or pharmacist if you have questions. What should I tell my health care provider before I take this medicine? They need to know if you have any of these conditions: -diabetes -heart disease -irregular heartbeat -liver disease -on hemodialysis -low blood counts, like low white blood cells, platelets, or hemoglobin -peripheral neuropathy -taking medicine for blood pressure -an unusual or allergic reaction to bortezomib, mannitol, boron, other medicines, foods, dyes, or preservatives -pregnant or trying to get pregnant -breast-feeding How should I use this medicine? This medicine is for injection into a vein or for injection under the skin. It is given by a health care professional in a hospital or clinic setting. Talk to your pediatrician regarding the use of this medicine in children. Special care may be needed. Overdosage: If you think you have taken too much of this medicine contact a poison control center or emergency room at once. NOTE: This medicine is only for you. Do not share this medicine with others. What if I miss a dose? It is important not to miss your dose. Call your doctor or health care professional if you are unable to keep an appointment. What may interact with this medicine? This medicine may interact with the following medications: -ketoconazole -rifampin -ritonavir -St. John's Wort This list may not describe all possible interactions. Give your health care provider a list of all the medicines, herbs, non-prescription drugs, or dietary supplements you use. Also tell them if you smoke, drink alcohol, or use illegal drugs. Some items may interact with your medicine. What  should I watch for while using this medicine? Visit your doctor for checks on your progress. This drug may make you feel generally unwell. This is not uncommon, as chemotherapy can affect healthy cells as well as cancer cells. Report any side effects. Continue your course of treatment even though you feel ill unless your doctor tells you to stop. You may get drowsy or dizzy. Do not drive, use machinery, or do anything that needs mental alertness until you know how this medicine affects you. Do not stand or sit up quickly, especially if you are an older patient. This reduces the risk of dizzy or fainting spells. In some cases, you may be given additional medicines to help with side effects. Follow all directions for their use. Call your doctor or health care professional for advice if you get a fever, chills or sore throat, or other symptoms of a cold or flu. Do not treat yourself. This drug decreases your body's ability to fight infections. Try to avoid being around people who are sick. This medicine may increase your risk to bruise or bleed. Call your doctor or health care professional if you notice any unusual bleeding. You may need blood work done while you are taking this medicine. In some patients, this medicine may cause a serious brain infection that may cause death. If you have any problems seeing, thinking, speaking, walking, or standing, tell your doctor right away. If you cannot reach your doctor, urgently seek other source of medical care. Do not become pregnant while taking this medicine. Women should inform their doctor if they wish to become pregnant or think they might be pregnant. There is a potential for serious  side effects to an unborn child. Talk to your health care professional or pharmacist for more information. Do not breast-feed an infant while taking this medicine. Check with your doctor or health care professional if you get an attack of severe diarrhea, nausea and vomiting, or if  you sweat a lot. The loss of too much body fluid can make it dangerous for you to take this medicine. What side effects may I notice from receiving this medicine? Side effects that you should report to your doctor or health care professional as soon as possible: -allergic reactions like skin rash, itching or hives, swelling of the face, lips, or tongue -breathing problems -changes in hearing -changes in vision -fast, irregular heartbeat -feeling faint or lightheaded, falls -pain, tingling, numbness in the hands or feet -right upper belly pain -seizures -swelling of the ankles, feet, hands -unusual bleeding or bruising -unusually weak or tired -vomiting -yellowing of the eyes or skin Side effects that usually do not require medical attention (report to your doctor or health care professional if they continue or are bothersome): -changes in emotions or moods -constipation -diarrhea -loss of appetite -headache -irritation at site where injected -nausea This list may not describe all possible side effects. Call your doctor for medical advice about side effects. You may report side effects to FDA at 1-800-FDA-1088. Where should I keep my medicine? This drug is given in a hospital or clinic and will not be stored at home. NOTE: This sheet is a summary. It may not cover all possible information. If you have questions about this medicine, talk to your doctor, pharmacist, or health care provider.    2016, Elsevier/Gold Standard. (2014-03-17 14:47:04)

## 2014-11-25 ENCOUNTER — Other Ambulatory Visit: Payer: Self-pay | Admitting: Nurse Practitioner

## 2014-11-25 DIAGNOSIS — C9002 Multiple myeloma in relapse: Secondary | ICD-10-CM

## 2014-11-25 LAB — IGG, IGA, IGM
IgA: 104 mg/dL (ref 69–380)
IgG (Immunoglobin G), Serum: 689 mg/dL — ABNORMAL LOW (ref 690–1700)
IgM, Serum: 67 mg/dL (ref 52–322)

## 2014-11-25 LAB — PROTEIN ELECTROPHORESIS, SERUM, WITH REFLEX
Albumin ELP: 4 g/dL (ref 3.8–4.8)
Alpha-1-Globulin: 0.3 g/dL (ref 0.2–0.3)
Alpha-2-Globulin: 0.7 g/dL (ref 0.5–0.9)
Beta 2: 0.4 g/dL (ref 0.2–0.5)
Beta Globulin: 0.5 g/dL (ref 0.4–0.6)
Gamma Globulin: 0.7 g/dL — ABNORMAL LOW (ref 0.8–1.7)
Total Protein, Serum Electrophoresis: 6.4 g/dL (ref 6.1–8.1)

## 2014-11-25 LAB — KAPPA/LAMBDA LIGHT CHAINS
Kappa free light chain: 1.67 mg/dL (ref 0.33–1.94)
Kappa:Lambda Ratio: 0.9 (ref 0.26–1.65)
Lambda Free Lght Chn: 1.85 mg/dL (ref 0.57–2.63)

## 2014-11-25 LAB — IFE INTERPRETATION

## 2014-11-25 MED ORDER — POMALIDOMIDE 1 MG PO CAPS: ORAL_CAPSULE | ORAL | Status: DC

## 2014-11-26 ENCOUNTER — Ambulatory Visit
Admission: RE | Admit: 2014-11-26 | Discharge: 2014-11-26 | Disposition: A | Discharge status: Home / Self Care | Payer: BC Managed Care – PPO | Source: Ambulatory Visit | Attending: Radiation Oncology | Admitting: Radiation Oncology

## 2014-11-26 ENCOUNTER — Encounter: Payer: Self-pay | Admitting: Radiation Oncology

## 2014-11-26 VITALS — BP 129/79 | HR 81 | Temp 98.3°F | Resp 18 | Ht 64.0 in | Wt 190.2 lb

## 2014-11-26 DIAGNOSIS — C541 Malignant neoplasm of endometrium: Secondary | ICD-10-CM

## 2014-11-26 NOTE — Progress Notes (Signed)
Radiation Oncology         [phone] ________________________________  Name: Emily Greer MRN: [id number]  Date: 11/26/2014  DOB: [birth year]  Follow-Up Visit Note  CC: Emily Spurling, MD  Emily Morning, MD   Diagnosis:  Recurrent endometrial cancer  Interval Since Last Radiation:  2 years and 3  months in which the patient completed intracavitary brachytherapy treatments for recurrence  Narrative:  The patient returns today for routine follow-up with radiation oncology. She denies symptoms of pain or pain when using her vaginal dialator. She reports symptoms of dysuria when she first urinates in the Greer, but this pain/burning "goes away" as the day progresses. This symptom is new and she noticed it started when she started taking the medication Pomalyst. She denies cloudiness or odor in her urine and reports not being prone to bladder infections. However, she reports having a bladder infection about one year ago. She had a bone marrow transplant completed in July of 2016. She was hospitalized for three weeks aftewards and has a follow-up appointment with [facility] in July of 2017. This was her second transplant. Her first transplant was completed 16 years ago, also at [facility]. She did not mean to miss her follow-up appointment with Dr. Skeet Latch, MD. She denies any current bowel issues and her last bowel movement was last night.  She reports spotting after intercourse or after using her vaginal dilator. She report having a good appetite and denies having nausea or fatigue. The patient projected a healthy mental status and was not accompanied by family for today's visit.                          ALLERGIES:  is allergic to codeine.  Meds: Current Outpatient Prescriptions  Medication Sig Dispense Refill  . acyclovir (ZOVIRAX) 400 MG tablet TAKE 1 TABLET BY MOUTH TWICE DAILY 60 tablet 0  . amLODipine (NORVASC) 10 MG tablet TAKE 1 TABLET BY MOUTH DAILY    . Cholecalciferol  (VITAMIN D3) 2000 UNITS TABS Take 1 tablet by mouth daily.    [name] Kitchen loratadine (CLARITIN) 10 MG tablet Take 10 mg by mouth every Greer.     . pomalidomide (POMALYST) 1 MG capsule Take with water on days 1-21. Repeat every 28 days. [id number] 21 capsule 0  . potassium chloride SA (K-DUR,KLOR-CON) 20 MEQ tablet Take by mouth.    . triamterene-hydrochlorothiazide (MAXZIDE-25) 37.5-25 MG per tablet Take 1 tablet by mouth daily. 30 tablet 6   No current facility-administered medications for this encounter.    Physical Findings: The patient is in no acute distress. Patient is alert and oriented. There is no significant changes to the status of overall health to be noted at this time.  height is 5\' 4"  (1.626 m) and weight is 190 lb 3.2 oz (86.274 kg). Her oral temperature is 98.3 F (36.8 C). Her blood pressure is 129/79 and her pulse is 81. Her respiration is 18.  Lungs are clear to auscultation bilaterally. Heart has regular rate and rhythm. No palpable cervical, supraclavicular, or axillary adenopathy.  External genitalia unremarkable. No mucosal lesions in vaginal wall. Radiation changes are noted in the proximal vagina. The mucosa bleeds easily with manipulation. No palpable masses on bimanual and rectal vaginal exam.  Lab Findings: Lab Results  Component Value Date   WBC 4.4 11/23/2014   HGB 11.2* 11/23/2014   HCT 33.7* 11/23/2014   MCV 81 11/23/2014   PLT 190 11/23/2014  Radiographic Findings: No results found.  Impression: Emily Greer is a 64 year old woman presenting to clinic in regards to her recurrent endometrial cancer.  The patient understands that she can access her appointments and medical records via [facility]. No evidence of recurrence on clinical exam today.  Plan:  Healthy methods of management in regards to reported symptoms were reviewed in detail. The patient understands to continue the use of her vaginal dilator as instructed. She is advised to follow up with Dr.  Skeet Latch, MD in about three months time and Dr. Carolanne Greer in April of 2017. The patient is advised to attend her follow-up appointment with [facility] in regards to her bone marrow transplant as scheduled in January of 2017. If the patient's reported symptom of dysuria continues, further testing for possible bladder infection will be completed. She is to continue the administration of her medication Pomalyst. All vocalized questions and concerns have been addressed. If the patient develops any further questions or concerns in regards to her treatment and recovery, she has been encouraged to contact Dr. Sondra Come, MD. She is advised of her follow-up appointment with radiation oncology to take place as scheduled in July of 2017.   This document serves as a record of services personally performed by Emily Pray, MD. It was created on his behalf by Emily Greer, a trained medical scribe. The creation of this record is based on the scribe's personal observations and the provider's statements to them. This document has been checked and approved by the attending provider.    -----------------------------------  Emily Promise, PhD, MD

## 2014-11-26 NOTE — Progress Notes (Signed)
Emily Greer here for follow up.  She denies pain.  She reports having dysuria when she first urinates in the morning.  She said it clears up during the day.  She said she noticed it started when she started taking Pomalyst.  She had a bone marrow transplant in July.  She denies having any bowel issues and her last bm was last night.  She reports having spotting after intercourse or using her vaginal dilator.  She report having a good appetite and denies having nausea or fatigue.  BP 129/79 mmHg  Pulse 81  Temp(Src) 98.3 F (36.8 C) (Oral)  Resp 18  Ht 5\' 4"  (1.626 m)  Wt 190 lb 3.2 oz (86.274 kg)  BMI 32.63 kg/m2

## 2014-11-30 ENCOUNTER — Other Ambulatory Visit: Payer: Self-pay | Admitting: Hematology & Oncology

## 2014-12-07 ENCOUNTER — Encounter: Payer: Self-pay | Admitting: Hematology & Oncology

## 2014-12-07 ENCOUNTER — Ambulatory Visit (HOSPITAL_BASED_OUTPATIENT_CLINIC_OR_DEPARTMENT_OTHER): Payer: BC Managed Care – PPO | Admitting: Hematology & Oncology

## 2014-12-07 ENCOUNTER — Ambulatory Visit (HOSPITAL_BASED_OUTPATIENT_CLINIC_OR_DEPARTMENT_OTHER): Payer: BC Managed Care – PPO

## 2014-12-07 ENCOUNTER — Other Ambulatory Visit (HOSPITAL_BASED_OUTPATIENT_CLINIC_OR_DEPARTMENT_OTHER): Payer: BC Managed Care – PPO

## 2014-12-07 VITALS — BP 155/80 | HR 97 | Temp 98.0°F | Resp 18 | Ht 64.0 in | Wt 185.8 lb

## 2014-12-07 DIAGNOSIS — C9002 Multiple myeloma in relapse: Secondary | ICD-10-CM | POA: Diagnosis not present

## 2014-12-07 DIAGNOSIS — C9000 Multiple myeloma not having achieved remission: Secondary | ICD-10-CM

## 2014-12-07 DIAGNOSIS — Z8544 Personal history of malignant neoplasm of other female genital organs: Secondary | ICD-10-CM | POA: Diagnosis not present

## 2014-12-07 DIAGNOSIS — Z5112 Encounter for antineoplastic immunotherapy: Secondary | ICD-10-CM | POA: Diagnosis not present

## 2014-12-07 LAB — CBC WITH DIFFERENTIAL (CANCER CENTER ONLY)
BASO#: 0.1 10*3/uL (ref 0.0–0.2)
BASO%: 1.9 % (ref 0.0–2.0)
EOS%: 2.3 % (ref 0.0–7.0)
Eosinophils Absolute: 0.1 10*3/uL (ref 0.0–0.5)
HCT: 34.3 % — ABNORMAL LOW (ref 34.8–46.6)
HGB: 11.6 g/dL (ref 11.6–15.9)
LYMPH#: 1.2 10*3/uL (ref 0.9–3.3)
LYMPH%: 26.1 % (ref 14.0–48.0)
MCH: 27 pg (ref 26.0–34.0)
MCHC: 33.8 g/dL (ref 32.0–36.0)
MCV: 80 fL — ABNORMAL LOW (ref 81–101)
MONO#: 0.8 10*3/uL (ref 0.1–0.9)
MONO%: 17.6 % — ABNORMAL HIGH (ref 0.0–13.0)
NEUT#: 2.5 10*3/uL (ref 1.5–6.5)
NEUT%: 52.1 % (ref 39.6–80.0)
Platelets: 257 10*3/uL (ref 145–400)
RBC: 4.29 10*6/uL (ref 3.70–5.32)
RDW: 15.8 % — ABNORMAL HIGH (ref 11.1–15.7)
WBC: 4.8 10*3/uL (ref 3.9–10.0)

## 2014-12-07 LAB — CMP (CANCER CENTER ONLY)
ALT(SGPT): 19 U/L (ref 10–47)
AST: 22 U/L (ref 11–38)
Albumin: 4.1 g/dL (ref 3.3–5.5)
Alkaline Phosphatase: 49 U/L (ref 26–84)
BUN, Bld: 18 mg/dL (ref 7–22)
CO2: 24 mEq/L (ref 18–33)
Calcium: 9.7 mg/dL (ref 8.0–10.3)
Chloride: 101 mEq/L (ref 98–108)
Creat: 0.9 mg/dl (ref 0.6–1.2)
Glucose, Bld: 91 mg/dL (ref 73–118)
Potassium: 2.9 mEq/L — CL (ref 3.3–4.7)
Sodium: 139 mEq/L (ref 128–145)
Total Bilirubin: 0.6 mg/dl (ref 0.20–1.60)
Total Protein: 7.1 g/dL (ref 6.4–8.1)

## 2014-12-07 LAB — MAGNESIUM (CC13): Magnesium: 1.9 mg/dl (ref 1.5–2.5)

## 2014-12-07 MED ORDER — BORTEZOMIB CHEMO SQ INJECTION 3.5 MG (2.5MG/ML)
1.2500 mg/m2 | Freq: Once | INTRAMUSCULAR | Status: AC
  Administered 2014-12-07: 2.5 mg via SUBCUTANEOUS
  Filled 2014-12-07: qty 2.5

## 2014-12-07 MED ORDER — ONDANSETRON HCL 8 MG PO TABS
ORAL_TABLET | ORAL | Status: AC
  Filled 2014-12-07: qty 1

## 2014-12-07 MED ORDER — ONDANSETRON HCL 8 MG PO TABS
8.0000 mg | ORAL_TABLET | Freq: Once | ORAL | Status: AC
  Administered 2014-12-07: 8 mg via ORAL
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
## 2014-12-07 NOTE — Patient Instructions (Signed)
Hypokalemia Hypokalemia means that the amount of potassium in the blood is lower than normal.Potassium is a chemical, called an electrolyte, that helps regulate the amount of fluid in the body. It also stimulates muscle contraction and helps nerves function properly.Most of the body's potassium is inside of cells, and only a very small amount is in the blood. Because the amount in the blood is so small, minor changes can be life-threatening. CAUSES  Antibiotics.  Diarrhea or vomiting.  Using laxatives too much, which can cause diarrhea.  Chronic kidney disease.  Water pills (diuretics).  Eating disorders (bulimia).  Low magnesium level.  Sweating a lot. SIGNS AND SYMPTOMS  Weakness.  Constipation.  Fatigue.  Muscle cramps.  Mental confusion.  Skipped heartbeats or irregular heartbeat (palpitations).  Tingling or numbness. DIAGNOSIS  Your health care provider can diagnose hypokalemia with blood tests. In addition to checking your potassium level, your health care provider may also check other lab tests. TREATMENT Hypokalemia can be treated with potassium supplements taken by mouth or adjustments in your current medicines. If your potassium level is very low, you may need to get potassium through a vein (IV) and be monitored in the hospital. A diet high in potassium is also helpful. Foods high in potassium are:  Nuts, such as peanuts and pistachios.  Seeds, such as sunflower seeds and pumpkin seeds.  Peas, lentils, and lima beans.  Whole grain and bran cereals and breads.  Fresh fruit and vegetables, such as apricots, avocado, bananas, cantaloupe, kiwi, oranges, tomatoes, asparagus, and potatoes.  Orange and tomato juices.  Red meats.  Fruit yogurt. HOME CARE INSTRUCTIONS  Take all medicines as prescribed by your health care provider.  Maintain a healthy diet by including nutritious food, such as fruits, vegetables, nuts, whole grains, and lean meats.  If  you are taking a laxative, be sure to follow the directions on the label. SEEK MEDICAL CARE IF:  Your weakness gets worse.  You feel your heart pounding or racing.  You are vomiting or having diarrhea.  You are diabetic and having trouble keeping your blood glucose in the normal range. SEEK IMMEDIATE MEDICAL CARE IF:  You have chest pain, shortness of breath, or dizziness.  You are vomiting or having diarrhea for more than 2 days.  You faint. MAKE SURE YOU:   Understand these instructions.  Will watch your condition.  Will get help right away if you are not doing well or get worse.   This information is not intended to replace advice given to you by your health care provider. Make sure you discuss any questions you have with your health care provider.   Document Released: 01/16/2005 Document Revised: 02/06/2014 Document Reviewed: 07/19/2012 Elsevier Interactive Patient Education 2016 [employer].  Bortezomib injection What is this medicine? BORTEZOMIB (bor TEZ oh mib) is a medicine that targets proteins in cancer cells and stops the cancer cells from growing. It is used to treat multiple myeloma and mantle-cell lymphoma. This medicine may be used for other purposes; ask your health care provider or pharmacist if you have questions. What should I tell my health care provider before I take this medicine? They need to know if you have any of these conditions: -diabetes -heart disease -irregular heartbeat -liver disease -on hemodialysis -low blood counts, like low white blood cells, platelets, or hemoglobin -peripheral neuropathy -taking medicine for blood pressure -an unusual or allergic reaction to bortezomib, mannitol, boron, other medicines, foods, dyes, or preservatives -pregnant or trying to get pregnant -  breast-feeding How should I use this medicine? This medicine is for injection into a vein or for injection under the skin. It is given by a health care  professional in a hospital or clinic setting. Talk to your pediatrician regarding the use of this medicine in children. Special care may be needed. Overdosage: If you think you have taken too much of this medicine contact a poison control center or emergency room at once. NOTE: This medicine is only for you. Do not share this medicine with others. What if I miss a dose? It is important not to miss your dose. Call your doctor or health care professional if you are unable to keep an appointment. What may interact with this medicine? This medicine may interact with the following medications: -ketoconazole -rifampin -ritonavir -St. John's Wort This list may not describe all possible interactions. Give your health care provider a list of all the medicines, herbs, non-prescription drugs, or dietary supplements you use. Also tell them if you smoke, drink alcohol, or use illegal drugs. Some items may interact with your medicine. What should I watch for while using this medicine? Visit your doctor for checks on your progress. This drug may make you feel generally unwell. This is not uncommon, as chemotherapy can affect healthy cells as well as cancer cells. Report any side effects. Continue your course of treatment even though you feel ill unless your doctor tells you to stop. You may get drowsy or dizzy. Do not drive, use machinery, or do anything that needs mental alertness until you know how this medicine affects you. Do not stand or sit up quickly, especially if you are an older patient. This reduces the risk of dizzy or fainting spells. In some cases, you may be given additional medicines to help with side effects. Follow all directions for their use. Call your doctor or health care professional for advice if you get a fever, chills or sore throat, or other symptoms of a cold or flu. Do not treat yourself. This drug decreases your body's ability to fight infections. Try to avoid being around people who  are sick. This medicine may increase your risk to bruise or bleed. Call your doctor or health care professional if you notice any unusual bleeding. You may need blood work done while you are taking this medicine. In some patients, this medicine may cause a serious brain infection that may cause death. If you have any problems seeing, thinking, speaking, walking, or standing, tell your doctor right away. If you cannot reach your doctor, urgently seek other source of medical care. Do not become pregnant while taking this medicine. Women should inform their doctor if they wish to become pregnant or think they might be pregnant. There is a potential for serious side effects to an unborn child. Talk to your health care professional or pharmacist for more information. Do not breast-feed an infant while taking this medicine. Check with your doctor or health care professional if you get an attack of severe diarrhea, nausea and vomiting, or if you sweat a lot. The loss of too much body fluid can make it dangerous for you to take this medicine. What side effects may I notice from receiving this medicine? Side effects that you should report to your doctor or health care professional as soon as possible: -allergic reactions like skin rash, itching or hives, swelling of the face, lips, or tongue -breathing problems -changes in hearing -changes in vision -fast, irregular heartbeat -feeling faint or lightheaded, falls -pain,  tingling, numbness in the hands or feet -right upper belly pain -seizures -swelling of the ankles, feet, hands -unusual bleeding or bruising -unusually weak or tired -vomiting -yellowing of the eyes or skin Side effects that usually do not require medical attention (report to your doctor or health care professional if they continue or are bothersome): -changes in emotions or moods -constipation -diarrhea -loss of appetite -headache -irritation at site where injected -nausea This  list may not describe all possible side effects. Call your doctor for medical advice about side effects. You may report side effects to FDA at 1-800-FDA-1088. Where should I keep my medicine? This drug is given in a hospital or clinic and will not be stored at home. NOTE: This sheet is a summary. It may not cover all possible information. If you have questions about this medicine, talk to your doctor, pharmacist, or health care provider.    2016, Elsevier/Gold Standard. (2014-03-17 14:47:04)

## 2014-12-16 ENCOUNTER — Telehealth: Payer: Self-pay | Admitting: Oncology

## 2014-12-16 NOTE — Telephone Encounter (Signed)
Emily Greer called and was wondering if there were any results from her last visit with Dr. Sondra Come.  Advised her that a pap smear had not been done so there was not any results available.  Emily Greer verbalized agreement and then asked when her next appointment was.  Notified her that it was 08/26/15.

## 2014-12-17 ENCOUNTER — Other Ambulatory Visit: Payer: Self-pay | Admitting: Nurse Practitioner

## 2014-12-17 DIAGNOSIS — C9002 Multiple myeloma in relapse: Secondary | ICD-10-CM

## 2014-12-17 MED ORDER — POMALIDOMIDE 1 MG PO CAPS: ORAL_CAPSULE | ORAL | Status: DC

## 2014-12-21 ENCOUNTER — Ambulatory Visit (HOSPITAL_BASED_OUTPATIENT_CLINIC_OR_DEPARTMENT_OTHER): Payer: BC Managed Care – PPO

## 2014-12-21 VITALS — BP 136/69 | HR 88 | Temp 98.2°F | Resp 18

## 2014-12-21 DIAGNOSIS — C9002 Multiple myeloma in relapse: Secondary | ICD-10-CM | POA: Diagnosis not present

## 2014-12-21 DIAGNOSIS — C9001 Multiple myeloma in remission: Secondary | ICD-10-CM

## 2014-12-21 DIAGNOSIS — Z5112 Encounter for antineoplastic immunotherapy: Secondary | ICD-10-CM

## 2014-12-21 DIAGNOSIS — C9000 Multiple myeloma not having achieved remission: Secondary | ICD-10-CM

## 2014-12-21 LAB — CBC WITH DIFFERENTIAL (CANCER CENTER ONLY)
BASO#: 0.1 10*3/uL (ref 0.0–0.2)
BASO%: 2.2 % — ABNORMAL HIGH (ref 0.0–2.0)
EOS%: 4.2 % (ref 0.0–7.0)
Eosinophils Absolute: 0.2 10*3/uL (ref 0.0–0.5)
HCT: 33.3 % — ABNORMAL LOW (ref 34.8–46.6)
HGB: 11.3 g/dL — ABNORMAL LOW (ref 11.6–15.9)
LYMPH#: 1 10*3/uL (ref 0.9–3.3)
LYMPH%: 19.5 % (ref 14.0–48.0)
MCH: 26.8 pg (ref 26.0–34.0)
MCHC: 33.9 g/dL (ref 32.0–36.0)
MCV: 79 fL — ABNORMAL LOW (ref 81–101)
MONO#: 1.1 10*3/uL — ABNORMAL HIGH (ref 0.1–0.9)
MONO%: 21.7 % — ABNORMAL HIGH (ref 0.0–13.0)
NEUT#: 2.6 10*3/uL (ref 1.5–6.5)
NEUT%: 52.4 % (ref 39.6–80.0)
Platelets: 247 10*3/uL (ref 145–400)
RBC: 4.22 10*6/uL (ref 3.70–5.32)
RDW: 16.5 % — ABNORMAL HIGH (ref 11.1–15.7)
WBC: 5 10*3/uL (ref 3.9–10.0)

## 2014-12-21 LAB — CMP (CANCER CENTER ONLY)
ALT(SGPT): 18 U/L (ref 10–47)
AST: 22 U/L (ref 11–38)
Albumin: 3.5 g/dL (ref 3.3–5.5)
Alkaline Phosphatase: 48 U/L (ref 26–84)
BUN, Bld: 16 mg/dL (ref 7–22)
CO2: 28 mEq/L (ref 18–33)
Calcium: 9.8 mg/dL (ref 8.0–10.3)
Chloride: 102 mEq/L (ref 98–108)
Creat: 1.1 mg/dl (ref 0.6–1.2)
Glucose, Bld: 100 mg/dL (ref 73–118)
Potassium: 3.1 mEq/L — ABNORMAL LOW (ref 3.3–4.7)
Sodium: 143 mEq/L (ref 128–145)
Total Bilirubin: 0.6 mg/dl (ref 0.20–1.60)
Total Protein: 7.2 g/dL (ref 6.4–8.1)

## 2014-12-21 MED ORDER — ONDANSETRON HCL 8 MG PO TABS
8.0000 mg | ORAL_TABLET | Freq: Once | ORAL | Status: AC
Start: 2014-12-21 — End: 2014-12-21
  Administered 2014-12-21: 8 mg via ORAL

## 2014-12-21 MED ORDER — BORTEZOMIB CHEMO SQ INJECTION 3.5 MG (2.5MG/ML)
1.2000 mg/m2 | Freq: Once | INTRAMUSCULAR | Status: AC
  Administered 2014-12-21: 2.5 mg via SUBCUTANEOUS
  Filled 2014-12-21: qty 2.5

## 2014-12-21 MED ORDER — ONDANSETRON HCL 8 MG PO TABS
ORAL_TABLET | ORAL | Status: AC
  Filled 2014-12-21: qty 1

## 2014-12-21 NOTE — Patient Instructions (Signed)
Bortezomib injection What is this medicine? BORTEZOMIB (bor TEZ oh mib) is a medicine that targets proteins in cancer cells and stops the cancer cells from growing. It is used to treat multiple myeloma and mantle-cell lymphoma. This medicine may be used for other purposes; ask your health care provider or pharmacist if you have questions. What should I tell my health care provider before I take this medicine? They need to know if you have any of these conditions: -diabetes -heart disease -irregular heartbeat -liver disease -on hemodialysis -low blood counts, like low white blood cells, platelets, or hemoglobin -peripheral neuropathy -taking medicine for blood pressure -an unusual or allergic reaction to bortezomib, mannitol, boron, other medicines, foods, dyes, or preservatives -pregnant or trying to get pregnant -breast-feeding How should I use this medicine? This medicine is for injection into a vein or for injection under the skin. It is given by a health care professional in a hospital or clinic setting. Talk to your pediatrician regarding the use of this medicine in children. Special care may be needed. Overdosage: If you think you have taken too much of this medicine contact a poison control center or emergency room at once. NOTE: This medicine is only for you. Do not share this medicine with others. What if I miss a dose? It is important not to miss your dose. Call your doctor or health care professional if you are unable to keep an appointment. What may interact with this medicine? This medicine may interact with the following medications: -ketoconazole -rifampin -ritonavir -St. John's Wort This list may not describe all possible interactions. Give your health care provider a list of all the medicines, herbs, non-prescription drugs, or dietary supplements you use. Also tell them if you smoke, drink alcohol, or use illegal drugs. Some items may interact with your medicine. What  should I watch for while using this medicine? Visit your doctor for checks on your progress. This drug may make you feel generally unwell. This is not uncommon, as chemotherapy can affect healthy cells as well as cancer cells. Report any side effects. Continue your course of treatment even though you feel ill unless your doctor tells you to stop. You may get drowsy or dizzy. Do not drive, use machinery, or do anything that needs mental alertness until you know how this medicine affects you. Do not stand or sit up quickly, especially if you are an older patient. This reduces the risk of dizzy or fainting spells. In some cases, you may be given additional medicines to help with side effects. Follow all directions for their use. Call your doctor or health care professional for advice if you get a fever, chills or sore throat, or other symptoms of a cold or flu. Do not treat yourself. This drug decreases your body's ability to fight infections. Try to avoid being around people who are sick. This medicine may increase your risk to bruise or bleed. Call your doctor or health care professional if you notice any unusual bleeding. You may need blood work done while you are taking this medicine. In some patients, this medicine may cause a serious brain infection that may cause death. If you have any problems seeing, thinking, speaking, walking, or standing, tell your doctor right away. If you cannot reach your doctor, urgently seek other source of medical care. Do not become pregnant while taking this medicine. Women should inform their doctor if they wish to become pregnant or think they might be pregnant. There is a potential for serious  side effects to an unborn child. Talk to your health care professional or pharmacist for more information. Do not breast-feed an infant while taking this medicine. Check with your doctor or health care professional if you get an attack of severe diarrhea, nausea and vomiting, or if  you sweat a lot. The loss of too much body fluid can make it dangerous for you to take this medicine. What side effects may I notice from receiving this medicine? Side effects that you should report to your doctor or health care professional as soon as possible: -allergic reactions like skin rash, itching or hives, swelling of the face, lips, or tongue -breathing problems -changes in hearing -changes in vision -fast, irregular heartbeat -feeling faint or lightheaded, falls -pain, tingling, numbness in the hands or feet -right upper belly pain -seizures -swelling of the ankles, feet, hands -unusual bleeding or bruising -unusually weak or tired -vomiting -yellowing of the eyes or skin Side effects that usually do not require medical attention (report to your doctor or health care professional if they continue or are bothersome): -changes in emotions or moods -constipation -diarrhea -loss of appetite -headache -irritation at site where injected -nausea This list may not describe all possible side effects. Call your doctor for medical advice about side effects. You may report side effects to FDA at 1-800-FDA-1088. Where should I keep my medicine? This drug is given in a hospital or clinic and will not be stored at home. NOTE: This sheet is a summary. It may not cover all possible information. If you have questions about this medicine, talk to your doctor, pharmacist, or health care provider.    2016, Elsevier/Gold Standard. (2014-03-17 14:47:04)

## 2014-12-23 ENCOUNTER — Other Ambulatory Visit: Payer: Self-pay | Admitting: Gynecologic Oncology

## 2014-12-23 ENCOUNTER — Telehealth: Payer: Self-pay

## 2014-12-23 ENCOUNTER — Other Ambulatory Visit (HOSPITAL_BASED_OUTPATIENT_CLINIC_OR_DEPARTMENT_OTHER): Payer: BC Managed Care – PPO

## 2014-12-23 DIAGNOSIS — R300 Dysuria: Secondary | ICD-10-CM

## 2014-12-23 LAB — URINALYSIS, MICROSCOPIC - CHCC
Bilirubin (Urine): NEGATIVE
Glucose: NEGATIVE mg/dL
Ketones: NEGATIVE mg/dL
Leukocyte Esterase: NEGATIVE
Nitrite: NEGATIVE
Protein: NEGATIVE mg/dL
Specific Gravity, Urine: 1.01 (ref 1.003–1.035)
Urobilinogen, UR: 0.2 mg/dL (ref 0.2–1)
WBC, UA: NEGATIVE (ref 0–2)
pH: 6.5 (ref 4.6–8.0)

## 2014-12-23 NOTE — Progress Notes (Signed)
Patient called this am and spoke with Maudie Mercury in the office.  Reporting dysuria and blood when she wipes.  Lab appt made to check urine for UTI.  No other concerns voiced.  We will contact her with the results.

## 2014-12-23 NOTE — Telephone Encounter (Signed)
Orders received from [facility] to contact the patient to update with U/A results are negative and the culture and sensitivity are pending . Patient informed that we will be contacting her with the results of the culture . Patient states understanding , denies further questions or concerns at this time .

## 2014-12-24 LAB — URINE CULTURE

## 2014-12-25 NOTE — Telephone Encounter (Signed)
Patient contacted with urine culture results being negative for significant growth , patient states that she continues to have "burning with urination and that she has noted "blood " in urine after having a bowel movement. Patient denies having hemorrhoids , but states she noticed that this "started" after starting her "Pomalyst" . Patient instructed to contact Dr Burney Gauze with the symptoms she has been experiencing after the initiation of the Polmalyst . Patient states she contact Dr Marin Olp with the blood and burning , writer to update NP, patient denies further questions at this time .

## 2014-12-28 ENCOUNTER — Encounter (HOSPITAL_COMMUNITY): Payer: Self-pay | Admitting: Emergency Medicine

## 2014-12-28 ENCOUNTER — Inpatient Hospital Stay (HOSPITAL_COMMUNITY)
Admission: EM | Admit: 2014-12-28 | Discharge: 2015-01-01 | DRG: 194 | Disposition: A | Discharge status: Home / Self Care | Payer: BC Managed Care – PPO | Attending: Internal Medicine | Admitting: Internal Medicine

## 2014-12-28 ENCOUNTER — Emergency Department (HOSPITAL_COMMUNITY): Payer: BC Managed Care – PPO

## 2014-12-28 DIAGNOSIS — R319 Hematuria, unspecified: Secondary | ICD-10-CM

## 2014-12-28 DIAGNOSIS — E876 Hypokalemia: Secondary | ICD-10-CM | POA: Diagnosis present

## 2014-12-28 DIAGNOSIS — D61818 Other pancytopenia: Secondary | ICD-10-CM | POA: Diagnosis present

## 2014-12-28 DIAGNOSIS — D649 Anemia, unspecified: Secondary | ICD-10-CM | POA: Diagnosis present

## 2014-12-28 DIAGNOSIS — J189 Pneumonia, unspecified organism: Principal | ICD-10-CM | POA: Diagnosis present

## 2014-12-28 DIAGNOSIS — D508 Other iron deficiency anemias: Secondary | ICD-10-CM

## 2014-12-28 DIAGNOSIS — D500 Iron deficiency anemia secondary to blood loss (chronic): Secondary | ICD-10-CM | POA: Insufficient documentation

## 2014-12-28 DIAGNOSIS — C9001 Multiple myeloma in remission: Secondary | ICD-10-CM | POA: Diagnosis present

## 2014-12-28 DIAGNOSIS — D6481 Anemia due to antineoplastic chemotherapy: Secondary | ICD-10-CM | POA: Diagnosis present

## 2014-12-28 DIAGNOSIS — O864 Pyrexia of unknown origin following delivery: Secondary | ICD-10-CM

## 2014-12-28 DIAGNOSIS — R509 Fever, unspecified: Secondary | ICD-10-CM | POA: Diagnosis not present

## 2014-12-28 DIAGNOSIS — Z9481 Bone marrow transplant status: Secondary | ICD-10-CM

## 2014-12-28 DIAGNOSIS — T451X5A Adverse effect of antineoplastic and immunosuppressive drugs, initial encounter: Secondary | ICD-10-CM | POA: Diagnosis present

## 2014-12-28 DIAGNOSIS — C9000 Multiple myeloma not having achieved remission: Secondary | ICD-10-CM | POA: Diagnosis present

## 2014-12-28 DIAGNOSIS — Z923 Personal history of irradiation: Secondary | ICD-10-CM

## 2014-12-28 DIAGNOSIS — D701 Agranulocytosis secondary to cancer chemotherapy: Secondary | ICD-10-CM | POA: Diagnosis present

## 2014-12-28 DIAGNOSIS — Z9484 Stem cells transplant status: Secondary | ICD-10-CM

## 2014-12-28 DIAGNOSIS — C541 Malignant neoplasm of endometrium: Secondary | ICD-10-CM | POA: Diagnosis present

## 2014-12-28 DIAGNOSIS — M810 Age-related osteoporosis without current pathological fracture: Secondary | ICD-10-CM | POA: Diagnosis present

## 2014-12-28 DIAGNOSIS — I10 Essential (primary) hypertension: Secondary | ICD-10-CM | POA: Diagnosis present

## 2014-12-28 DIAGNOSIS — Z800 Family history of malignant neoplasm of digestive organs: Secondary | ICD-10-CM

## 2014-12-28 DIAGNOSIS — N390 Urinary tract infection, site not specified: Secondary | ICD-10-CM | POA: Diagnosis present

## 2014-12-28 DIAGNOSIS — Z79899 Other long term (current) drug therapy: Secondary | ICD-10-CM

## 2014-12-28 DIAGNOSIS — Y95 Nosocomial condition: Secondary | ICD-10-CM | POA: Diagnosis present

## 2014-12-28 MED ORDER — ACETAMINOPHEN 325 MG PO TABS: 650.0000 mg | ORAL_TABLET | Freq: Once | ORAL | Status: DC | PRN

## 2014-12-28 MED ORDER — SODIUM CHLORIDE 0.9 % IV BOLUS (SEPSIS)
1000.0000 mL | Freq: Once | INTRAVENOUS | Status: AC
  Administered 2014-12-28: 1000 mL via INTRAVENOUS

## 2014-12-28 MED ORDER — ACETAMINOPHEN 500 MG PO TABS
1000.0000 mg | ORAL_TABLET | Freq: Once | ORAL | Status: AC
Start: 2014-12-28 — End: 2014-12-28
  Administered 2014-12-28: 1000 mg via ORAL
  Filled 2014-12-28: qty 2

## 2014-12-28 NOTE — ED Notes (Signed)
Dr. Oni at bedside. 

## 2014-12-28 NOTE — ED Notes (Signed)
RN is will draw blood work.

## 2014-12-28 NOTE — ED Notes (Signed)
Patient dr. Durene Cal her over her to get a chest xray due to her having cancer and a fever. Patient states this started today. Patient states she has not been feeling well.

## 2014-12-28 NOTE — ED Provider Notes (Signed)
CSN: [id number]     Arrival date & time 12/28/14  2232 History  By signing my name below, I, Phillis Haggis, attest that this documentation has been prepared under the direction and in the presence of Tomasita Crumble, MD. Electronically Signed: Phillis Haggis, ED Scribe. 12/28/2014. 11:12 PM.  Chief Complaint  Patient presents with  . chemo card    The history is provided by the patient. No language interpreter was used.  HPI Comments: Emily Greer is a 64 y.o. female with a hx of endometrial carcinoma, bone marrow transplant, HTN, and lambda light chain myeloma who presents to the Emergency Department complaining of fever tmax 102 F onset earlier today. Pt was sent to the ED by her PCP for a chest x-ray due to her cancer diagnosis. Pt states that she has been having a productive cough with yellow sputum "for a while" and was given Tessalon pearls and azithromycin for 5 days, which she has since finished to relief. She reports intermittent blood after bowel movements, dark urine, and dysuria over the past week. She says that she has had a culture performed that was negative but continues to have dysuria. Pt denies taking anything for her fever PTA. Husband states that her bowel movements "have not been normal in a while," and pt reports worsening diarrhea with eating green vegetables. She denies rhinorrhea, abdominal pain, current diarrhea, vomiting, vaginal bleeding, or vaginal discharge. Pt takes Pomalyst for 21 days and is on the 21st dose today.   Past Medical History  Diagnosis Date  . Lambda light chain myeloma (HCC) 11/11/2007  . Hypertension 11/28/97  . H/O multiple myeloma 11/28/1997  . Staphylococcus aureus bacteremia 11/28/1997  . Staphylococcus epidermidis bacteremia 11/28/97  . Pregnancy induced hypertension   . Osteoporosis 12/18/05    Increased  risk   . Increased BMI 12/18/05  . Post-menopausal bleeding 12/19/06  . Vaginal atrophy 12/19/06  . Elevated hemoglobin A1c    Borderline  . Fibroid 07/26/10    asymptomatic  . Vaginal atrophy   . PONV (postoperative nausea and vomiting)     nausea in past, none recent  . Endometrial carcinoma (HCC) 05/28/12  . History of radiation therapy 6/4, 6/11, 6/19, 6/25, 07/29/2012    vagina 30.5 gray in 5 fx, HDR brachytherapy   Past Surgical History  Procedure Laterality Date  . Tubal ligation  1986  . Ectopic pregnancy surgery  1992  . Hysteroscopy w/d&c  09/27/2011    Procedure: DILATATION AND CURETTAGE /HYSTEROSCOPY;  Surgeon: Hal Morales, MD;  Location: [facility];  Service: Gynecology;;  . Laparotomy  10/31/2011    Procedure: EXPLORATORY LAPAROTOMY;  Surgeon: Laurette Schimke, MD PHD;  Location: [facility];  Service: Gynecology;  Laterality: N/A;  . Abdominal hysterectomy  10/31/2011    Procedure: HYSTERECTOMY ABDOMINAL;  Surgeon: Laurette Schimke, MD PHD;  Location: [facility];  Service: Gynecology;  Laterality: N/A;  . Salpingoophorectomy  10/31/2011    Procedure: SALPINGO OOPHERECTOMY;  Surgeon: Laurette Schimke, MD PHD;  Location: [facility];  Service: Gynecology;  Laterality: Bilateral;   Lymph Nodes sampling   Family History  Problem Relation Age of Onset  . Colon cancer Mother    Social History  Substance Use Topics  . Smoking status: Never Smoker   . Smokeless tobacco: Never Used     Comment: never smoked  . Alcohol Use: No   OB History    Gravida Para Term Preterm AB TAB SAB Ectopic Multiple Living   3 2  2     Review of Systems 10 Systems reviewed and all are negative for acute change except as noted in the HPI.  Allergies  Codeine  Home Medications   Prior to Admission medications   Medication Sig Start Date End Date Taking? Authorizing Provider  acyclovir (ZOVIRAX) 400 MG tablet TAKE 1 TABLET BY MOUTH TWICE DAILY 07/09/14  Yes Josph Macho, MD  amLODipine (NORVASC) 10 MG tablet TAKE 1 TABLET BY MOUTH DAILY 10/16/14  Yes Historical Provider, MD  benzonatate (TESSALON) 200 MG capsule Take 200 mg by  mouth 3 (three) times daily as needed for cough.   Yes Historical Provider, MD  Cholecalciferol (VITAMIN D3) 2000 UNITS TABS Take 1 tablet by mouth daily.   Yes Historical Provider, MD  loratadine (CLARITIN) 10 MG tablet Take 10 mg by mouth every morning.    Yes Historical Provider, MD  oxymetazoline (AFRIN) 0.05 % nasal spray Place 1 spray into both nostrils 2 (two) times daily.   Yes Historical Provider, MD  pomalidomide (POMALYST) 1 MG capsule Take with water on days 1-21. Repeat every 28 days. OTTO#[id number] 12/17/14  Yes Josph Macho, MD  potassium chloride SA (K-DUR,KLOR-CON) 20 MEQ tablet Take 20 mEq by mouth 2 (two) times daily.    Yes Historical Provider, MD  triamterene-hydrochlorothiazide (MAXZIDE-25) 37.5-25 MG per tablet Take 1 tablet by mouth daily. 09/09/14  Yes Josph Macho, MD  acyclovir (ZOVIRAX) 400 MG tablet TAKE 1 TABLET BY MOUTH TWICE DAILY Patient not taking: Reported on 12/28/2014 11/30/14   Josph Macho, MD   BP 131/81 mmHg  Temp(Src) 100.7 F (38.2 C) (Oral)  Resp 16  Ht 5\' 4"  (1.626 m)  Wt 182 lb (82.555 kg)  BMI 31.22 kg/m2 Physical Exam  Constitutional: She is oriented to person, place, and time. She appears well-developed and well-nourished. No distress.  HENT:  Head: Normocephalic and atraumatic.  Nose: Nose normal.  Mouth/Throat: Oropharynx is clear and moist. No oropharyngeal exudate.  Eyes: Conjunctivae and EOM are normal. Pupils are equal, round, and reactive to light. No scleral icterus.  Neck: Normal range of motion. Neck supple. No JVD present. No tracheal deviation present. No thyromegaly present.  Cardiovascular: Normal rate, regular rhythm and normal heart sounds.  Exam reveals no gallop and no friction rub.   No murmur heard. Pulmonary/Chest: Effort normal and breath sounds normal. No respiratory distress. She has no wheezes. She exhibits no tenderness.  Abdominal: Soft. Bowel sounds are normal. She exhibits no distension and no mass. There  is no tenderness. There is no rebound and no guarding.  Musculoskeletal: Normal range of motion. She exhibits no edema or tenderness.  Lymphadenopathy:    She has no cervical adenopathy.  Neurological: She is alert and oriented to person, place, and time. No cranial nerve deficit. She exhibits normal muscle tone.  Skin: Skin is warm and dry. No rash noted. No erythema. No pallor.  Tactile fever  Nursing note and vitals reviewed.   ED Course  Procedures (including critical care time) COORDINATION OF CARE: 11:19 PM-Discussed treatment plan which includes labs, IV fluids, and x-ray with pt at bedside and pt agreed to plan.    Labs Review Labs Reviewed  COMPREHENSIVE METABOLIC PANEL - Abnormal; Notable for the following:    Potassium 3.0 (*)    Creatinine, Ser 1.12 (*)    Alkaline Phosphatase 37 (*)    GFR calc non Af Amer 52 (*)    GFR calc Af Amer 60 (*)  All other components within normal limits  URINALYSIS, ROUTINE W REFLEX MICROSCOPIC (NOT AT [facility]) - Abnormal; Notable for the following:    APPearance CLOUDY (*)    Hgb urine dipstick LARGE (*)    Protein, ur 30 (*)    Leukocytes, UA MODERATE (*)    All other components within normal limits  CBC WITH DIFFERENTIAL/PLATELET - Abnormal; Notable for the following:    Hemoglobin 11.1 (*)    HCT 33.8 (*)    RDW 17.1 (*)    All other components within normal limits  URINE MICROSCOPIC-ADD ON - Abnormal; Notable for the following:    Squamous Epithelial / LPF 0-5 (*)    Bacteria, UA RARE (*)    All other components within normal limits  CULTURE, BLOOD (ROUTINE X 2)  CULTURE, BLOOD (ROUTINE X 2)  URINE CULTURE  I-STAT CG4 LACTIC ACID, ED  I-STAT CG4 LACTIC ACID, ED  I-STAT CG4 LACTIC ACID, ED    Imaging Review Dg Chest 2 View  12/29/2014  CLINICAL DATA:  Fever and cough for 24 hours. EXAM: CHEST  2 VIEW COMPARISON:  05/31/2014 FINDINGS: Patchy opacity in the left lower lobe may represent early infectious infiltrate. The  right lung is clear. There is no pleural effusion. Heart size is normal. Hilar and mediastinal contours are unremarkable. Pulmonary vasculature is normal. IMPRESSION: Patchy left lower lobe opacity, possibly pneumonia. Follow-up radiography in 3-4 weeks is recommended to assure resolution. Electronically Signed   By: Andreas Newport M.D.   On: 12/29/2014 00:32   I have personally reviewed and evaluated these images and lab results as part of my medical decision-making.   EKG Interpretation None      MDM   Final diagnoses:  None   Patient presents to the ED for fever and cough.  Temp here is 101.7.  She is taking an immunomodulator for her cancer.  Will obtain labs, UA, chest xray for evaluation.  Tylenol given for fever.    CXR reveals LLL pneuomina, consistent with her history.  Will admit as the patient just finishes Zpack without any resolution of symptoms.  Blood cultures ordered, patient given ceftriaxone for treatment. I spoke with Dr. Porfirio Mylar with [facility] hospitalist to admit the patient to [facility] for further care. After Tylenol, repeat temperature is 98.7.   I personally performed the services described in this documentation, which was scribed in my presence. The recorded information has been reviewed and is accurate.      Everlene Balls, MD 12/29/14 [phone]

## 2014-12-29 ENCOUNTER — Encounter (HOSPITAL_COMMUNITY): Payer: Self-pay

## 2014-12-29 DIAGNOSIS — R509 Fever, unspecified: Secondary | ICD-10-CM | POA: Diagnosis present

## 2014-12-29 DIAGNOSIS — R768 Other specified abnormal immunological findings in serum: Secondary | ICD-10-CM | POA: Diagnosis not present

## 2014-12-29 DIAGNOSIS — J189 Pneumonia, unspecified organism: Secondary | ICD-10-CM | POA: Diagnosis present

## 2014-12-29 DIAGNOSIS — D701 Agranulocytosis secondary to cancer chemotherapy: Secondary | ICD-10-CM | POA: Diagnosis present

## 2014-12-29 DIAGNOSIS — Z9481 Bone marrow transplant status: Secondary | ICD-10-CM | POA: Diagnosis not present

## 2014-12-29 DIAGNOSIS — E876 Hypokalemia: Secondary | ICD-10-CM | POA: Diagnosis present

## 2014-12-29 DIAGNOSIS — C9000 Multiple myeloma not having achieved remission: Secondary | ICD-10-CM | POA: Diagnosis present

## 2014-12-29 DIAGNOSIS — I10 Essential (primary) hypertension: Secondary | ICD-10-CM | POA: Diagnosis present

## 2014-12-29 DIAGNOSIS — Y95 Nosocomial condition: Secondary | ICD-10-CM | POA: Diagnosis present

## 2014-12-29 DIAGNOSIS — C541 Malignant neoplasm of endometrium: Secondary | ICD-10-CM | POA: Diagnosis present

## 2014-12-29 DIAGNOSIS — D6481 Anemia due to antineoplastic chemotherapy: Secondary | ICD-10-CM | POA: Diagnosis present

## 2014-12-29 DIAGNOSIS — Z79899 Other long term (current) drug therapy: Secondary | ICD-10-CM | POA: Diagnosis not present

## 2014-12-29 DIAGNOSIS — N939 Abnormal uterine and vaginal bleeding, unspecified: Secondary | ICD-10-CM | POA: Diagnosis not present

## 2014-12-29 DIAGNOSIS — N390 Urinary tract infection, site not specified: Secondary | ICD-10-CM | POA: Diagnosis present

## 2014-12-29 DIAGNOSIS — Z8542 Personal history of malignant neoplasm of other parts of uterus: Secondary | ICD-10-CM | POA: Diagnosis not present

## 2014-12-29 DIAGNOSIS — M810 Age-related osteoporosis without current pathological fracture: Secondary | ICD-10-CM | POA: Diagnosis present

## 2014-12-29 DIAGNOSIS — D509 Iron deficiency anemia, unspecified: Secondary | ICD-10-CM | POA: Diagnosis not present

## 2014-12-29 DIAGNOSIS — Z9484 Stem cells transplant status: Secondary | ICD-10-CM | POA: Diagnosis not present

## 2014-12-29 DIAGNOSIS — C9002 Multiple myeloma in relapse: Secondary | ICD-10-CM

## 2014-12-29 DIAGNOSIS — Z800 Family history of malignant neoplasm of digestive organs: Secondary | ICD-10-CM | POA: Diagnosis not present

## 2014-12-29 DIAGNOSIS — Z923 Personal history of irradiation: Secondary | ICD-10-CM | POA: Diagnosis not present

## 2014-12-29 DIAGNOSIS — T451X5A Adverse effect of antineoplastic and immunosuppressive drugs, initial encounter: Secondary | ICD-10-CM | POA: Diagnosis present

## 2014-12-29 LAB — MAGNESIUM: Magnesium: 2 mg/dL (ref 1.7–2.4)

## 2014-12-29 LAB — URINALYSIS, ROUTINE W REFLEX MICROSCOPIC
Bilirubin Urine: NEGATIVE
Glucose, UA: NEGATIVE mg/dL
Ketones, ur: NEGATIVE mg/dL
Nitrite: NEGATIVE
Protein, ur: 30 mg/dL — AB
Specific Gravity, Urine: 1.025 (ref 1.005–1.030)
pH: 5.5 (ref 5.0–8.0)

## 2014-12-29 LAB — CBC WITH DIFFERENTIAL/PLATELET
Basophils Absolute: 0 10*3/uL (ref 0.0–0.1)
Basophils Relative: 1 %
Eosinophils Absolute: 0 10*3/uL (ref 0.0–0.7)
Eosinophils Relative: 0 %
HCT: 33.8 % — ABNORMAL LOW (ref 36.0–46.0)
Hemoglobin: 11.1 g/dL — ABNORMAL LOW (ref 12.0–15.0)
Lymphocytes Relative: 20 %
Lymphs Abs: 0.9 10*3/uL (ref 0.7–4.0)
MCH: 26.5 pg (ref 26.0–34.0)
MCHC: 32.8 g/dL (ref 30.0–36.0)
MCV: 80.7 fL (ref 78.0–100.0)
Monocytes Absolute: 0.8 10*3/uL (ref 0.1–1.0)
Monocytes Relative: 17 %
Neutro Abs: 2.7 10*3/uL (ref 1.7–7.7)
Neutrophils Relative %: 62 %
Platelets: 176 10*3/uL (ref 150–400)
RBC: 4.19 MIL/uL (ref 3.87–5.11)
RDW: 17.1 % — ABNORMAL HIGH (ref 11.5–15.5)
WBC: 4.4 10*3/uL (ref 4.0–10.5)

## 2014-12-29 LAB — COMPREHENSIVE METABOLIC PANEL
ALT: 21 U/L (ref 14–54)
AST: 26 U/L (ref 15–41)
Albumin: 3.9 g/dL (ref 3.5–5.0)
Alkaline Phosphatase: 37 U/L — ABNORMAL LOW (ref 38–126)
Anion gap: 10 (ref 5–15)
BUN: 13 mg/dL (ref 6–20)
CO2: 26 mmol/L (ref 22–32)
Calcium: 9 mg/dL (ref 8.9–10.3)
Chloride: 102 mmol/L (ref 101–111)
Creatinine, Ser: 1.12 mg/dL — ABNORMAL HIGH (ref 0.44–1.00)
GFR calc Af Amer: 60 mL/min — ABNORMAL LOW (ref >60)
GFR calc non Af Amer: 52 mL/min — ABNORMAL LOW (ref >60)
Glucose, Bld: 95 mg/dL (ref 65–99)
Potassium: 3 mmol/L — ABNORMAL LOW (ref 3.5–5.1)
Sodium: 138 mmol/L (ref 135–145)
Total Bilirubin: 0.5 mg/dL (ref 0.3–1.2)
Total Protein: 7.2 g/dL (ref 6.5–8.1)

## 2014-12-29 LAB — URINE MICROSCOPIC-ADD ON

## 2014-12-29 LAB — INFLUENZA PANEL BY PCR (TYPE A & B)
H1N1 flu by pcr: NOT DETECTED
Influenza A By PCR: NEGATIVE
Influenza B By PCR: NEGATIVE

## 2014-12-29 LAB — PROTIME-INR
INR: 0.94 (ref 0.00–1.49)
Prothrombin Time: 12.8 seconds (ref 11.6–15.2)

## 2014-12-29 LAB — I-STAT CG4 LACTIC ACID, ED
Lactic Acid, Venous: 0.5 mmol/L (ref 0.5–2.0)
Lactic Acid, Venous: 1.16 mmol/L (ref 0.5–2.0)

## 2014-12-29 LAB — EXPECTORATED SPUTUM ASSESSMENT W GRAM STAIN, RFLX TO RESP C

## 2014-12-29 MED ORDER — ONDANSETRON HCL 4 MG/2ML IJ SOLN: 4.0000 mg | Freq: Three times a day (TID) | INTRAMUSCULAR | Status: DC | PRN

## 2014-12-29 MED ORDER — AMLODIPINE BESYLATE 10 MG PO TABS
10.0000 mg | ORAL_TABLET | Freq: Every day | ORAL | Status: DC
  Administered 2014-12-29 – 2015-01-01 (×4): 10 mg via ORAL
  Filled 2014-12-29 (×5): qty 1

## 2014-12-29 MED ORDER — MORPHINE SULFATE (PF) 2 MG/ML IV SOLN: 2.0000 mg | INTRAVENOUS | Status: DC | PRN

## 2014-12-29 MED ORDER — ALBUTEROL SULFATE (2.5 MG/3ML) 0.083% IN NEBU: 2.5000 mg | INHALATION_SOLUTION | RESPIRATORY_TRACT | Status: DC | PRN

## 2014-12-29 MED ORDER — BENZONATATE 100 MG PO CAPS
200.0000 mg | ORAL_CAPSULE | Freq: Three times a day (TID) | ORAL | Status: DC | PRN
  Administered 2014-12-29 – 2014-12-31 (×4): 200 mg via ORAL
  Filled 2014-12-29 (×4): qty 2

## 2014-12-29 MED ORDER — PIPERACILLIN-TAZOBACTAM 3.375 G IVPB 30 MIN
3.3750 g | Freq: Once | INTRAVENOUS | Status: AC
  Administered 2014-12-29: 3.375 g via INTRAVENOUS
  Filled 2014-12-29: qty 50

## 2014-12-29 MED ORDER — SODIUM CHLORIDE 0.9 % IV SOLN
INTRAVENOUS | Status: DC
  Administered 2014-12-29 – 2014-12-31 (×4): via INTRAVENOUS

## 2014-12-29 MED ORDER — DEXTROSE 5 % IV SOLN
2.0000 g | Freq: Once | INTRAVENOUS | Status: AC
  Administered 2014-12-29: 2 g via INTRAVENOUS
  Filled 2014-12-29: qty 2

## 2014-12-29 MED ORDER — VANCOMYCIN HCL IN DEXTROSE 750-5 MG/150ML-% IV SOLN
750.0000 mg | Freq: Two times a day (BID) | INTRAVENOUS | Status: AC
  Administered 2014-12-29 – 2014-12-31 (×5): 750 mg via INTRAVENOUS
  Filled 2014-12-29 (×6): qty 150

## 2014-12-29 MED ORDER — VITAMIN D 1000 UNITS PO TABS
2000.0000 [IU] | ORAL_TABLET | Freq: Every day | ORAL | Status: DC
  Administered 2014-12-29 – 2015-01-01 (×4): 2000 [IU] via ORAL
  Filled 2014-12-29 (×5): qty 2

## 2014-12-29 MED ORDER — PIPERACILLIN-TAZOBACTAM 3.375 G IVPB
3.3750 g | Freq: Three times a day (TID) | INTRAVENOUS | Status: DC
  Administered 2014-12-29 – 2015-01-01 (×9): 3.375 g via INTRAVENOUS
  Filled 2014-12-29 (×10): qty 50

## 2014-12-29 MED ORDER — POTASSIUM CHLORIDE CRYS ER 20 MEQ PO TBCR
60.0000 meq | EXTENDED_RELEASE_TABLET | Freq: Four times a day (QID) | ORAL | Status: AC
  Administered 2014-12-29 (×2): 60 meq via ORAL
  Filled 2014-12-29 (×2): qty 3

## 2014-12-29 MED ORDER — HEPARIN SODIUM (PORCINE) 5000 UNIT/ML IJ SOLN
5000.0000 [IU] | Freq: Three times a day (TID) | INTRAMUSCULAR | Status: DC
  Administered 2014-12-29 – 2015-01-01 (×11): 5000 [IU] via SUBCUTANEOUS
  Filled 2014-12-29 (×14): qty 1

## 2014-12-29 MED ORDER — HYDRALAZINE HCL 20 MG/ML IJ SOLN: 5.0000 mg | INTRAMUSCULAR | Status: DC | PRN

## 2014-12-29 MED ORDER — VANCOMYCIN HCL IN DEXTROSE 1-5 GM/200ML-% IV SOLN
1000.0000 mg | Freq: Once | INTRAVENOUS | Status: AC
  Administered 2014-12-29: 1000 mg via INTRAVENOUS
  Filled 2014-12-29: qty 200

## 2014-12-29 MED ORDER — ACYCLOVIR 400 MG PO TABS
400.0000 mg | ORAL_TABLET | Freq: Two times a day (BID) | ORAL | Status: DC
  Administered 2014-12-29 – 2015-01-01 (×7): 400 mg via ORAL
  Filled 2014-12-29 (×9): qty 1

## 2014-12-29 MED ORDER — POTASSIUM CHLORIDE CRYS ER 20 MEQ PO TBCR: 20.0000 meq | EXTENDED_RELEASE_TABLET | Freq: Two times a day (BID) | ORAL | Status: DC

## 2014-12-29 MED ORDER — OXYMETAZOLINE HCL 0.05 % NA SOLN
1.0000 | Freq: Two times a day (BID) | NASAL | Status: DC
  Administered 2014-12-29 – 2015-01-01 (×7): 1 via NASAL
  Filled 2014-12-29 (×3): qty 15

## 2014-12-29 MED ORDER — LORATADINE 10 MG PO TABS
10.0000 mg | ORAL_TABLET | Freq: Every morning | ORAL | Status: DC
  Administered 2014-12-29 – 2015-01-01 (×4): 10 mg via ORAL
  Filled 2014-12-29 (×5): qty 1

## 2014-12-29 NOTE — Progress Notes (Signed)
ANTIBIOTIC CONSULT NOTE - INITIAL  Pharmacy Consult for Vancomycin and Zosyn  Indication: HCAP  Allergies  Allergen Reactions  . Codeine Nausea Only    Patient Measurements: Height: _0  (162.6 cm) Weight: 182 lb (82.555 kg) IBW/kg (Calculated) : 54.7 Adjusted Body Weight:   Vital Signs: Temp: 98.5 F (36.9 C) (11/29 0530) Temp Source: Oral (11/29 0530) BP: 136/72 mmHg (11/29 0530) Pulse Rate: 80 (11/29 0530) Intake/Output from previous day:   Intake/Output from this shift:    Labs:  Recent Labs  12/29/14 0011  WBC 4.4  HGB 11.1*  PLT 176  CREATININE 1.12*   Estimated Creatinine Clearance: 54.2 mL/min (by C-G formula based on Cr of 1.12). No results for input(s): VANCOTROUGH, VANCOPEAK, VANCORANDOM, GENTTROUGH, GENTPEAK, GENTRANDOM, TOBRATROUGH, TOBRAPEAK, TOBRARND, AMIKACINPEAK, AMIKACINTROU, AMIKACIN in the last 72 hours.   Microbiology: Recent Results (from the past 720 hour(s))  Urine culture     Status: None   Collection Time: 12/23/14 11:56 AM  Result Value Ref Range Status   Urine Culture, Routine Culture, Urine  Final    Comment: Final - ===== COLONY COUNT: ===== 2,000 COLONIES/ML Insignificant Growth     Medical History: Past Medical History  Diagnosis Date  . Lambda light chain myeloma ([place]) 11/11/2007  . Hypertension 11/28/97  . H/O multiple myeloma 11/28/1997  . Staphylococcus aureus bacteremia 11/28/1997  . Staphylococcus epidermidis bacteremia 11/28/97  . Pregnancy induced hypertension   . Osteoporosis 12/18/05    Increased  risk   . Increased BMI 12/18/05  . Post-menopausal bleeding 12/19/06  . Vaginal atrophy 12/19/06  . Elevated hemoglobin A1c     Borderline  . Fibroid 07/26/10    asymptomatic  . Vaginal atrophy   . PONV (postoperative nausea and vomiting)     nausea in past, none recent  . Endometrial carcinoma ([place]) 05/28/12  . History of radiation therapy 6/4, 6/11, 6/19, 6/25, 07/29/2012    vagina 30.5 gray in 5 fx, HDR  brachytherapy    Medications:  Anti-infectives    Start     Dose/Rate Route Frequency Ordered Stop   12/29/14 1800  vancomycin (VANCOCIN) IVPB 750 mg/150 ml premix     750 mg 150 mL/hr over 60 Minutes Intravenous Every 12 hours 12/29/14 0546     12/29/14 1000  acyclovir (ZOVIRAX) tablet 400 mg     400 mg Oral 2 times daily 12/29/14 0228     12/29/14 0800  piperacillin-tazobactam (ZOSYN) IVPB 3.375 g     3.375 g 12.5 mL/hr over 240 Minutes Intravenous 3 times per day 12/29/14 0546     12/29/14 0300  piperacillin-tazobactam (ZOSYN) IVPB 3.375 g     3.375 g 100 mL/hr over 30 Minutes Intravenous  Once 12/29/14 0253 12/29/14 0401   12/29/14 0300  vancomycin (VANCOCIN) IVPB 1000 mg/200 mL premix     1,000 mg 200 mL/hr over 60 Minutes Intravenous  Once 12/29/14 0253     12/29/14 0130  cefTRIAXone (ROCEPHIN) 2 g in dextrose 5 % 50 mL IVPB     2 g 100 mL/hr over 30 Minutes Intravenous  Once 12/29/14 0120 12/29/14 0204     Assessment: Patient in ED with fever, cough and burning on urination.  Chest x-ray shows patchy LLL inflitrate.  First dose of antibiotics already given.  Goal of Therapy:  Vancomycin trough level 15-20 mcg/ml  Zosyn based on renal function Appropriate antibiotic dosing for renal function; eradication of infection   Plan:  Measure antibiotic drug levels at steady state Follow  up culture results Vancomycin 763m iv q12hr  Zosyn 3.375g IV Q8H infused over 4hrs.   GTyler Deis JShea StakesCrowford 12/29/2014,5:48 AM

## 2014-12-29 NOTE — Progress Notes (Signed)
PT Cancellation Note  Patient Details Name: Emily Greer MRN: [id number] DOB: Jun 17, 1950   Cancelled Treatment:    Reason Eval/Treat Not Completed: PT screened, no needs identified, will sign off. Spoke with pt who denied need for PT/OT services. Will sign off. Please reorder if needs change.    Weston Anna, MPT Pager: [phone]

## 2014-12-29 NOTE — ED Notes (Signed)
RN will attempt to draw blood work from IV.

## 2014-12-29 NOTE — Progress Notes (Signed)
[facility] PROGRESS NOTE   Emily Greer [id number] DOB: [birth year] DOA: 12/28/2014 PCP: Laurena Slimmer, MD  HPI/Subjective: Seen with husband at bedside, very minimal cough and sputum production. Said complaining about burning while passing urine.  Assessment/Plan: Principal Problem:   HCAP (healthcare-associated pneumonia) Active Problems:   Hypertension   Endometrial carcinoma (HCC)   Fever   Multiple myeloma (HCC)   Bone marrow transplant status (HCC)   UTI (lower urinary tract infection)   Hypokalemia   CAP (community acquired pneumonia)   This is a no charge note, patient seen earlier today by my colleague Dr. Clyde Lundborg. Patient seen and examined, data base reviewed. History of multiple myeloma with bone marrow transplantation redo in July 2016. Started on Zosyn and vancomycin for pneumonia and UTI. Fever of 100.7 here, She reported fever up to 102.2 at home.  Code Status: Full Code Family Communication: Plan discussed with the patient. Disposition Plan: Remains inpatient Diet: Diet Heart Room service appropriate?: Yes; Fluid consistency:: Thin  Consultants:  None  Procedures:  None  Antibiotics:  None   Objective: Filed Vitals:   12/29/14 0800 12/29/14 1039  BP: 115/58 133/64  Pulse: 82   Temp: 99.8 F (37.7 C)   Resp: 20     Intake/Output Summary (Last 24 hours) at 12/29/14 1442 Last data filed at 12/29/14 1000  Gross per 24 hour  Intake    240 ml  Output      0 ml  Net    240 ml   Filed Weights   12/28/14 2307  Weight: 82.555 kg (182 lb)    Exam: General: Alert and awake, oriented x3, not in any acute distress. HEENT: anicteric sclera, pupils reactive to light and accommodation, EOMI CVS: S1-S2 clear, no murmur rubs or gallops Chest: clear to auscultation bilaterally, no wheezing, rales or rhonchi Abdomen: soft nontender, nondistended, normal bowel sounds, no organomegaly Extremities: no cyanosis, clubbing or edema noted  bilaterally Neuro: Cranial nerves II-XII intact, no focal neurological deficits  Data Reviewed: Basic Metabolic Panel:  Recent Labs Lab 12/29/14 0011 12/29/14 0455  NA 138  --   K 3.0*  --   CL 102  --   CO2 26  --   GLUCOSE 95  --   BUN 13  --   CREATININE 1.12*  --   CALCIUM 9.0  --   MG  --  2.0   Liver Function Tests:  Recent Labs Lab 12/29/14 0011  AST 26  ALT 21  ALKPHOS 37*  BILITOT 0.5  PROT 7.2  ALBUMIN 3.9   No results for input(s): LIPASE, AMYLASE in the last 168 hours. No results for input(s): AMMONIA in the last 168 hours. CBC:  Recent Labs Lab 12/29/14 0011  WBC 4.4  NEUTROABS 2.7  HGB 11.1*  HCT 33.8*  MCV 80.7  PLT 176   Cardiac Enzymes: No results for input(s): CKTOTAL, CKMB, CKMBINDEX, TROPONINI in the last 168 hours. BNP (last 3 results) No results for input(s): BNP in the last 8760 hours.  ProBNP (last 3 results) No results for input(s): PROBNP in the last 8760 hours.  CBG: No results for input(s): GLUCAP in the last 168 hours.  Micro Recent Results (from the past 240 hour(s))  Urine culture     Status: None   Collection Time: 12/23/14 11:56 AM  Result Value Ref Range Status   Urine Culture, Routine Culture, Urine  Final    Comment: Final - ===== COLONY COUNT: ===== 2,000 COLONIES/ML Insignificant Growth  Culture, sputum-assessment     Status: None   Collection Time: 12/29/14 10:44 AM  Result Value Ref Range Status   Specimen Description SPUTUM  Final   Special Requests Immunocompromised  Final   Sputum evaluation   Final    THIS SPECIMEN IS ACCEPTABLE. RESPIRATORY CULTURE REPORT TO FOLLOW.   Report Status 12/29/2014 FINAL  Final     Studies: Dg Chest 2 View  12/29/2014  CLINICAL DATA:  Fever and cough for 24 hours. EXAM: CHEST  2 VIEW COMPARISON:  05/31/2014 FINDINGS: Patchy opacity in the left lower lobe may represent early infectious infiltrate. The right lung is clear. There is no pleural effusion. Heart size is  normal. Hilar and mediastinal contours are unremarkable. Pulmonary vasculature is normal. IMPRESSION: Patchy left lower lobe opacity, possibly pneumonia. Follow-up radiography in 3-4 weeks is recommended to assure resolution. Electronically Signed   By: Andreas Newport M.D.   On: 12/29/2014 00:32    Scheduled Meds: . acyclovir  400 mg Oral BID  . amLODipine  10 mg Oral Daily  . cholecalciferol  2,000 Units Oral Daily  . heparin  5,000 Units Subcutaneous 3 times per day  . loratadine  10 mg Oral q morning - 10a  . oxymetazoline  1 spray Each Nare BID  . piperacillin-tazobactam (ZOSYN)  IV  3.375 g Intravenous 3 times per day  . vancomycin  750 mg Intravenous Q12H   Continuous Infusions: . sodium chloride 100 mL/hr at 12/29/14 0243       Time spent: 35 minutes    [facility]  [facility] Pager [phone] If 7PM-7AM, please contact night-coverage at [email or web address], password [email or web address] 12/29/2014, 2:42 PM  LOS: 0 days

## 2014-12-29 NOTE — Progress Notes (Signed)
OT Cancellation Note  Patient Details Name: Emily Greer MRN: [id number] DOB: February 13, 1950   Cancelled Treatment:    Reason Eval/Treat Not Completed: PT screened, no needs identified, will sign off  [name] 12/29/2014, 2:48 PM  Lesle Chris, OTR/L [id number] 12/29/2014

## 2014-12-29 NOTE — ED Notes (Signed)
Dr. Suzie Portela informed patient to take POTASSIUM CHLORIDE 93mEq x 2 from her own bottle of potassium that she had at bedside. Observed patient take two white, large tablets from a bottle that was identified as POTASSIUM CHLORIDE.

## 2014-12-29 NOTE — H&P (Signed)
[facility] History and Physical  CHANTAL WORTHEY [id number] DOB: 1950-09-30 DOA: 12/28/2014  Referring physician: ED physician PCP: Foye Spurling, MD  Specialists:   Chief Complaint: Fever and cough, burning on urination  HPI: Emily Greer is a 64 y.o. female with PMH of hypertension, multiple myeloma, s/p of bone marrow transplantation, currently on pomalidomide, endometrial cancer (S/p of radiation therapy), osteoporosis, who presents with fever, cough and burning on urination.  Patient reports that since yesterday she started having fever with temperature 102 at home. She has cough with clear mucus production. She does not have chest pain or shortness of breath. She reports having burning on urination, but no urinary frequency and dysuria. She does not have abdominal pain, diarrhea, unilateral weakness. She reports that she was recently treated with azithromycin by her oncologist for possible pneumonia. Patient states that she just completed 21-day of Pomalidomide today and is going to have 7-day off tomorrow.   In ED, patient was found to have lactate 1.16, WBC 4.4, hemoglobin 11.1, temperature 100.7, no tachycardia, no tachypnea, potassium 3.0, urinalysis with moderate amount of leukocytes. Chest x-ray showed patchy LLL infiltration. Patient is admitted to inpatient for further eval and treatment.  Where does patient live?   At home    Can patient participate in ADLs?   Some   Review of Systems:   General: has fevers, chills, no changes in body weight, has poor appetite, has fatigue HEENT: no blurry vision, hearing changes or sore throat Pulm: no dyspnea, coughing, wheezing CV: no chest pain, palpitations Abd: no nausea, vomiting, abdominal pain, diarrhea, constipation GU: no dysuria, has burning on urination, no increased urinary frequency, hematuria  Ext: no leg edema Neuro: no unilateral weakness, numbness, or tingling, no vision change or hearing loss Skin:  no rash MSK: No muscle spasm, no deformity, no limitation of range of movement in spin Heme: No easy bruising.  Travel history: No recent long distant travel.  Allergy:  Allergies  Allergen Reactions  . Codeine Nausea Only    Past Medical History  Diagnosis Date  . Lambda light chain myeloma ([place]) 11/11/2007  . Hypertension 11/28/97  . H/O multiple myeloma 11/28/1997  . Staphylococcus aureus bacteremia 11/28/1997  . Staphylococcus epidermidis bacteremia 11/28/97  . Pregnancy induced hypertension   . Osteoporosis 12/18/05    Increased  risk   . Increased BMI 12/18/05  . Post-menopausal bleeding 12/19/06  . Vaginal atrophy 12/19/06  . Elevated hemoglobin A1c     Borderline  . Fibroid 07/26/10    asymptomatic  . Vaginal atrophy   . PONV (postoperative nausea and vomiting)     nausea in past, none recent  . Endometrial carcinoma ([place]) 05/28/12  . History of radiation therapy 6/4, 6/11, 6/19, 6/25, 07/29/2012    vagina 30.5 gray in 5 fx, HDR brachytherapy    Past Surgical History  Procedure Laterality Date  . Tubal ligation  1986  . Ectopic pregnancy surgery  1992  . Hysteroscopy w/d&c  09/27/2011    Procedure: DILATATION AND CURETTAGE /HYSTEROSCOPY;  Surgeon: Eldred Manges, MD;  Location: [facility];  Service: Gynecology;;  . Laparotomy  10/31/2011    Procedure: EXPLORATORY LAPAROTOMY;  Surgeon: [name], MD PHD;  Location: [facility];  Service: Gynecology;  Laterality: N/A;  . Abdominal hysterectomy  10/31/2011    Procedure: HYSTERECTOMY ABDOMINAL;  Surgeon: [name], MD PHD;  Location: [facility];  Service: Gynecology;  Laterality: N/A;  . Salpingoophorectomy  10/31/2011    Procedure: SALPINGO OOPHERECTOMY;  Surgeon: [name], MD PHD;  Location: [facility];  Service: Gynecology;  Laterality: Bilateral;   Lymph Nodes sampling    Social History:  reports that she has never smoked. She has never used smokeless tobacco. She reports that she does not drink alcohol or use  illicit drugs.  Family History:  Family History  Problem Relation Age of Onset  . Colon cancer Mother      Prior to Admission medications   Medication Sig Start Date End Date Taking? Authorizing Provider  acyclovir (ZOVIRAX) 400 MG tablet TAKE 1 TABLET BY MOUTH TWICE DAILY 07/09/14  Yes [name], MD  amLODipine (NORVASC) 10 MG tablet TAKE 1 TABLET BY MOUTH DAILY 10/16/14  Yes Historical Provider, MD  benzonatate (TESSALON) 200 MG capsule Take 200 mg by mouth 3 (three) times daily as needed for cough.   Yes Historical Provider, MD  Cholecalciferol (VITAMIN D3) 2000 UNITS TABS Take 1 tablet by mouth daily.   Yes Historical Provider, MD  loratadine (CLARITIN) 10 MG tablet Take 10 mg by mouth every morning.    Yes Historical Provider, MD  oxymetazoline (AFRIN) 0.05 % nasal spray Place 1 spray into both nostrils 2 (two) times daily.   Yes Historical Provider, MD  pomalidomide (POMALYST) 1 MG capsule Take with water on days 1-21. Repeat every 28 days. [id number] 12/17/14  Yes [name], MD  potassium chloride SA (K-DUR,KLOR-CON) 20 MEQ tablet Take 20 mEq by mouth 2 (two) times daily.    Yes Historical Provider, MD  triamterene-hydrochlorothiazide (MAXZIDE-25) 37.5-25 MG per tablet Take 1 tablet by mouth daily. 09/09/14  Yes [name], MD  acyclovir (ZOVIRAX) 400 MG tablet TAKE 1 TABLET BY MOUTH TWICE DAILY Patient not taking: Reported on 12/28/2014 11/30/14   [name], MD    Physical Exam: Filed Vitals:   12/28/14 2330 12/29/14 0030 12/29/14 0045 12/29/14 0230  BP: 136/78 122/68 114/72 121/65  Pulse: 96 91 88 81  Temp:   98.7 F (37.1 C) 98.7 F (37.1 C)  TempSrc:   Oral Oral  Resp: $Remo'19 18 13 18  'uogia$ Height:      Weight:      SpO2: 95% 95% 94% 95%   General: Not in acute distress HEENT:       Eyes: PERRL, EOMI, no scleral icterus.       ENT: No discharge from the ears and nose, no pharynx injection, no tonsillar enlargement.        Neck: No JVD, no bruit, no mass  felt. Heme: No neck lymph node enlargement. Cardiac: S1/S2, RRR, No murmurs, No gallops or rubs. Pulm: has fine crackles over left side posteriorly. No wheezing, rhonchi or rubs. Abd: Soft, nondistended, nontender, no rebound pain, no organomegaly, BS present. Ext: No pitting leg edema bilaterally. 2+DP/PT pulse bilaterally. Musculoskeletal: No joint deformities, No joint redness or warmth, no limitation of ROM in spin. Skin: No rashes.  Neuro: Alert, oriented X3, cranial nerves II-XII grossly intact, muscle strength 5/5 in all extremities, sensation to light touch intact.  Psych: Patient is not psychotic, no suicidal or hemocidal ideation.  Labs on Admission:  Basic Metabolic Panel:  Recent Labs Lab 12/29/14 0011  NA 138  K 3.0*  CL 102  CO2 26  GLUCOSE 95  BUN 13  CREATININE 1.12*  CALCIUM 9.0   Liver Function Tests:  Recent Labs Lab 12/29/14 0011  AST 26  ALT 21  ALKPHOS 37*  BILITOT 0.5  PROT 7.2  ALBUMIN 3.9  No results for input(s): LIPASE, AMYLASE in the last 168 hours. No results for input(s): AMMONIA in the last 168 hours. CBC:  Recent Labs Lab 12/29/14 0011  WBC 4.4  NEUTROABS 2.7  HGB 11.1*  HCT 33.8*  MCV 80.7  PLT 176   Cardiac Enzymes: No results for input(s): CKTOTAL, CKMB, CKMBINDEX, TROPONINI in the last 168 hours.  BNP (last 3 results) No results for input(s): BNP in the last 8760 hours.  ProBNP (last 3 results) No results for input(s): PROBNP in the last 8760 hours.  CBG: No results for input(s): GLUCAP in the last 168 hours.  Radiological Exams on Admission: Dg Chest 2 View  12/29/2014  CLINICAL DATA:  Fever and cough for 24 hours. EXAM: CHEST  2 VIEW COMPARISON:  05/31/2014 FINDINGS: Patchy opacity in the left lower lobe may represent early infectious infiltrate. The right lung is clear. There is no pleural effusion. Heart size is normal. Hilar and mediastinal contours are unremarkable. Pulmonary vasculature is normal.  IMPRESSION: Patchy left lower lobe opacity, possibly pneumonia. Follow-up radiography in 3-4 weeks is recommended to assure resolution. Electronically Signed   By: Andreas Newport M.D.   On: 12/29/2014 00:32    EKG: Not done in ED, will get one.   Assessment/Plan Principal Problem:   HCAP (healthcare-associated pneumonia) Active Problems:   Hypertension   Endometrial carcinoma (HCC)   Fever   Multiple myeloma ([place])   Bone marrow transplant status ([place])   UTI (lower urinary tract infection)   Hypokalemia   CAP (community acquired pneumonia)  HCAP (healthcare-associated pneumonia): Patient's fever and cough plus x-ray findings are consistent with HCAP. She is not septic on admission. Hemodynamically stable.  - Will admit to Telemetry Bed - IV Vancomycin and Zosyn - Tessalon for cough  - Albuterol Neb prn for SOB - Urine legionella and S. pneumococcal antigen - Follow up blood culture x2, sputum culture, plus Flu pcr - will trend lactic acid level  - IVF: 1L of NS bolus in ED, followed by 100 mL per hour of NS   HTN: -Hold Maxzid since pt is at risk of sepsis and need IVF. -continue amlodipine - IV hydralazine when necessary   Multiple myeloma [facility]): Patient has been followed up by Dr. Marin Olp. She is s/p of bone marrow transplantation. Currently on pomalidomide. Hgb stable. -f/u with Dr. Marin Olp -on Acyclovir PPx  UTI (lower urinary tract infection): -On Vanco and zosyn -F/U Ux  Hypokalemia: K= 3.0  on admission. - Repleted - Check Mg level  DVT ppx: SQ Heparin  Code Status: Full code Family Communication: None at bed side.   Disposition Plan: Admit to inpatient   Date of Service 12/29/2014    Ivor Costa [facility] Pager [phone]  If 7PM-7AM, please contact night-coverage [email or web address] Password [email or web address] 12/29/2014, 3:30 AM

## 2014-12-30 DIAGNOSIS — E876 Hypokalemia: Secondary | ICD-10-CM

## 2014-12-30 DIAGNOSIS — T451X5A Adverse effect of antineoplastic and immunosuppressive drugs, initial encounter: Secondary | ICD-10-CM

## 2014-12-30 DIAGNOSIS — D6481 Anemia due to antineoplastic chemotherapy: Secondary | ICD-10-CM

## 2014-12-30 DIAGNOSIS — I10 Essential (primary) hypertension: Secondary | ICD-10-CM

## 2014-12-30 DIAGNOSIS — J189 Pneumonia, unspecified organism: Principal | ICD-10-CM

## 2014-12-30 DIAGNOSIS — N390 Urinary tract infection, site not specified: Secondary | ICD-10-CM

## 2014-12-30 DIAGNOSIS — C9000 Multiple myeloma not having achieved remission: Secondary | ICD-10-CM

## 2014-12-30 LAB — CBC
HCT: 29.8 % — ABNORMAL LOW (ref 36.0–46.0)
Hemoglobin: 9.6 g/dL — ABNORMAL LOW (ref 12.0–15.0)
MCH: 25.5 pg — ABNORMAL LOW (ref 26.0–34.0)
MCHC: 32.2 g/dL (ref 30.0–36.0)
MCV: 79.3 fL (ref 78.0–100.0)
Platelets: 161 10*3/uL (ref 150–400)
RBC: 3.76 MIL/uL — ABNORMAL LOW (ref 3.87–5.11)
RDW: 17 % — ABNORMAL HIGH (ref 11.5–15.5)
WBC: 2.9 10*3/uL — ABNORMAL LOW (ref 4.0–10.5)

## 2014-12-30 LAB — URINE CULTURE

## 2014-12-30 LAB — BASIC METABOLIC PANEL
Anion gap: 6 (ref 5–15)
BUN: 10 mg/dL (ref 6–20)
CO2: 23 mmol/L (ref 22–32)
Calcium: 8.5 mg/dL — ABNORMAL LOW (ref 8.9–10.3)
Chloride: 108 mmol/L (ref 101–111)
Creatinine, Ser: 1.1 mg/dL — ABNORMAL HIGH (ref 0.44–1.00)
GFR calc Af Amer: >60 mL/min (ref >60)
GFR calc non Af Amer: 53 mL/min — ABNORMAL LOW (ref >60)
Glucose, Bld: 96 mg/dL (ref 65–99)
Potassium: 3.6 mmol/L (ref 3.5–5.1)
Sodium: 137 mmol/L (ref 135–145)

## 2014-12-30 MED ORDER — LEVALBUTEROL HCL 0.63 MG/3ML IN NEBU
0.6300 mg | INHALATION_SOLUTION | Freq: Four times a day (QID) | RESPIRATORY_TRACT | Status: DC
  Administered 2014-12-30 – 2014-12-31 (×5): 0.63 mg via RESPIRATORY_TRACT
  Filled 2014-12-30 (×4): qty 3

## 2014-12-30 MED ORDER — GUAIFENESIN ER 600 MG PO TB12
1200.0000 mg | ORAL_TABLET | Freq: Two times a day (BID) | ORAL | Status: DC
  Administered 2014-12-30 – 2015-01-01 (×5): 1200 mg via ORAL
  Filled 2014-12-30 (×5): qty 2

## 2014-12-30 MED ORDER — SACCHAROMYCES BOULARDII 250 MG PO CAPS
250.0000 mg | ORAL_CAPSULE | Freq: Two times a day (BID) | ORAL | Status: DC
  Administered 2014-12-30 – 2015-01-01 (×5): 250 mg via ORAL
  Filled 2014-12-30 (×5): qty 1

## 2014-12-30 NOTE — Progress Notes (Signed)
[facility] PROGRESS NOTE  Emily Greer TMA:[id number] DOB: 07-04-1950 DOA: 12/28/2014 PCP: Foye Spurling, MD  Assessment/Plan: #1 healthcare associated pneumonia Patient presented with fever, productive cough, chest x-ray findings consistent with a left lower lobe pneumonia. Patient with some clinical improvement. Blood cultures pending. Sputum cultures pending. Influenza PCR negative. Continue empiric IV vancomycin, IV Zosyn, Claritin. Will add Mucinex. Will place on scheduled nebulizer treatments. Follow.  #2 hypokalemia Repleted. Magnesium level at 2. Follow.  #3 anemia/neutropenia Likely secondary to acute infection and chemotherapy-induced. Patient with no overt bleeding. Follow H&H.  #4 urinary tract infection Urine cultures pending. Continue empiric Zosyn.  #5 hypertension Maxzide on hold. Continue Norvasc.  #6 multiple myeloma Status post bone marrow transplantation. Currently on pomalidomide. Being followed by Dr. and over. Outpatient follow-up.  #7 prophylaxis Heparin for DVT prophylaxis.   Code Status: Full Family Communication: Updated patient and husband at bedside. Disposition Plan: Home when afebrile 24 hours and then oral medications with clinical improvement.   Consultants:  None  Procedures:  Chest x-ray 12/28/2014    Antibiotics:  IV vancomycin 12/29/2014  IV Zosyn 12/29/2014  HPI/Subjective: Patient states feeling better. Still with cough.  Objective: Filed Vitals:   12/30/14 0800 12/30/14 1349  BP: 114/63 113/52  Pulse: 76 91  Temp: 99.3 F (37.4 C) 99 F (37.2 C)  Resp: 16 16    Intake/Output Summary (Last 24 hours) at 12/30/14 1437 Last data filed at 12/30/14 1201  Gross per 24 hour  Intake   1110 ml  Output      0 ml  Net   1110 ml   Filed Weights   12/28/14 2307  Weight: 82.555 kg (182 lb)    Exam:   General:  NAD  Cardiovascular: RRR  Respiratory: Some coarse breath sounds left greater than  right. Some rhonchi. No wheezing.  Abdomen: Soft, nontender, nondistended, positive bowel sounds.  Musculoskeletal: No clubbing cyanosis or edema.  Data Reviewed: Basic Metabolic Panel:  Recent Labs Lab 12/29/14 0011 12/29/14 0455 12/30/14 0534  NA 138  --  137  K 3.0*  --  3.6  CL 102  --  108  CO2 26  --  23  GLUCOSE 95  --  96  BUN 13  --  10  CREATININE 1.12*  --  1.10*  CALCIUM 9.0  --  8.5*  MG  --  2.0  --    Liver Function Tests:  Recent Labs Lab 12/29/14 0011  AST 26  ALT 21  ALKPHOS 37*  BILITOT 0.5  PROT 7.2  ALBUMIN 3.9   No results for input(s): LIPASE, AMYLASE in the last 168 hours. No results for input(s): AMMONIA in the last 168 hours. CBC:  Recent Labs Lab 12/29/14 0011 12/30/14 0534  WBC 4.4 2.9*  NEUTROABS 2.7  --   HGB 11.1* 9.6*  HCT 33.8* 29.8*  MCV 80.7 79.3  PLT 176 161   Cardiac Enzymes: No results for input(s): CKTOTAL, CKMB, CKMBINDEX, TROPONINI in the last 168 hours. BNP (last 3 results) No results for input(s): BNP in the last 8760 hours.  ProBNP (last 3 results) No results for input(s): PROBNP in the last 8760 hours.  CBG: No results for input(s): GLUCAP in the last 168 hours.  Recent Results (from the past 240 hour(s))  Urine culture     Status: None   Collection Time: 12/23/14 11:56 AM  Result Value Ref Range Status   Urine Culture, Routine Culture, Urine  Final  Comment: Final - ===== COLONY COUNT: ===== 2,000 COLONIES/ML Insignificant Growth   Culture, blood (routine x 2)     Status: None (Preliminary result)   Collection Time: 12/29/14 12:10 AM  Result Value Ref Range Status   Specimen Description BLOOD BLOOD RIGHT FOREARM  Final   Special Requests BOTTLES DRAWN AEROBIC AND ANAEROBIC 5CC  Final   Culture   Final    NO GROWTH 1 DAY Performed at [facility]    Report Status PENDING  Incomplete  Culture, blood (routine x 2)     Status: None (Preliminary result)   Collection Time: 12/29/14 12:20  AM  Result Value Ref Range Status   Specimen Description BLOOD RIGHT HAND  Final   Special Requests BOTTLES DRAWN AEROBIC AND ANAEROBIC 5CC  Final   Culture   Final    NO GROWTH 1 DAY Performed at [facility]    Report Status PENDING  Incomplete  Urine culture     Status: None   Collection Time: 12/29/14 12:30 AM  Result Value Ref Range Status   Specimen Description URINE, CLEAN CATCH  Final   Special Requests NONE  Final   Culture   Final    MULTIPLE SPECIES PRESENT, SUGGEST RECOLLECTION Performed at [facility]    Report Status 12/30/2014 FINAL  Final  Culture, sputum-assessment     Status: None   Collection Time: 12/29/14 10:44 AM  Result Value Ref Range Status   Specimen Description SPUTUM  Final   Special Requests Immunocompromised  Final   Sputum evaluation   Final    THIS SPECIMEN IS ACCEPTABLE. RESPIRATORY CULTURE REPORT TO FOLLOW.   Report Status 12/29/2014 FINAL  Final  Culture, respiratory (NON-Expectorated)     Status: None (Preliminary result)   Collection Time: 12/29/14 10:44 AM  Result Value Ref Range Status   Specimen Description SPUTUM  Final   Special Requests NONE  Final   Gram Stain   Final    MODERATE WBC PRESENT, PREDOMINANTLY MONONUCLEAR FEW SQUAMOUS EPITHELIAL CELLS PRESENT ABUNDANT GRAM POSITIVE COCCI IN PAIRS IN CLUSTERS MODERATE GRAM NEGATIVE RODS RARE GRAM POSITIVE RODS    Culture NO GROWTH Performed at [facility]   Final   Report Status PENDING  Incomplete     Studies: Dg Chest 2 View  12/29/2014  CLINICAL DATA:  Fever and cough for 24 hours. EXAM: CHEST  2 VIEW COMPARISON:  05/31/2014 FINDINGS: Patchy opacity in the left lower lobe may represent early infectious infiltrate. The right lung is clear. There is no pleural effusion. Heart size is normal. Hilar and mediastinal contours are unremarkable. Pulmonary vasculature is normal. IMPRESSION: Patchy left lower lobe opacity, possibly pneumonia. Follow-up radiography  in 3-4 weeks is recommended to assure resolution. Electronically Signed   By: Andreas Newport M.D.   On: 12/29/2014 00:32    Scheduled Meds: . acyclovir  400 mg Oral BID  . amLODipine  10 mg Oral Daily  . cholecalciferol  2,000 Units Oral Daily  . guaiFENesin  1,200 mg Oral BID  . heparin  5,000 Units Subcutaneous 3 times per day  . loratadine  10 mg Oral q morning - 10a  . oxymetazoline  1 spray Each Nare BID  . piperacillin-tazobactam (ZOSYN)  IV  3.375 g Intravenous 3 times per day  . vancomycin  750 mg Intravenous Q12H   Continuous Infusions: . sodium chloride 100 mL/hr at 12/30/14 1319    Principal Problem:   HCAP (healthcare-associated pneumonia) Active Problems:  Hypertension   Endometrial carcinoma (HCC)   Fever   Anemia   Multiple myeloma (HCC)   Bone marrow transplant status ([place])   UTI (lower urinary tract infection)   Hypokalemia   CAP (community acquired pneumonia)    Time spent: 60 minutes    THOMPSON,DANIEL M.D. [facility] Pager [phone]. If 7PM-7AM, please contact night-coverage at [email or web address], password [facility] 12/30/2014, 2:37 PM  LOS: 1 day

## 2014-12-30 NOTE — Care Management Note (Signed)
Case Management Note  Patient Details  Name: Emily Greer MRN: [id number] Date of Birth: [birth year]  Subjective/Objective: 64 y/o f admitted w/HCAP. From home. PT/OT-no needs.                    Action/Plan:d/c plan home.   Expected Discharge Date:   (unknown)               Expected Discharge Plan:  Home/Self Care  In-House Referral:     Discharge planning Services  CM Consult  Post Acute Care Choice:    Choice offered to:     DME Arranged:    DME Agency:     HH Arranged:    HH Agency:     Status of Service:  In process, will continue to follow  Medicare Important Message Given:    Date Medicare IM Given:    Medicare IM give by:    Date Additional Medicare IM Given:    Additional Medicare Important Message give by:     If discussed at [place] of Stay Meetings, dates discussed:    Additional Comments:  Dessa Phi, RN 12/30/2014, 12:36 PM

## 2014-12-31 DIAGNOSIS — Z9484 Stem cells transplant status: Secondary | ICD-10-CM

## 2014-12-31 DIAGNOSIS — R768 Other specified abnormal immunological findings in serum: Secondary | ICD-10-CM

## 2014-12-31 LAB — IRON AND TIBC
Iron: 13 ug/dL — ABNORMAL LOW (ref 28–170)
Saturation Ratios: 4 % — ABNORMAL LOW (ref 10.4–31.8)
TIBC: 302 ug/dL (ref 250–450)
UIBC: 289 ug/dL

## 2014-12-31 LAB — STREP PNEUMONIAE URINARY ANTIGEN: Strep Pneumo Urinary Antigen: NEGATIVE

## 2014-12-31 LAB — CULTURE, RESPIRATORY W GRAM STAIN: Culture: NORMAL

## 2014-12-31 LAB — CBC WITH DIFFERENTIAL/PLATELET
Basophils Absolute: 0 10*3/uL (ref 0.0–0.1)
Basophils Relative: 1 %
Eosinophils Absolute: 0.2 10*3/uL (ref 0.0–0.7)
Eosinophils Relative: 5 %
HCT: 29.5 % — ABNORMAL LOW (ref 36.0–46.0)
Hemoglobin: 9.8 g/dL — ABNORMAL LOW (ref 12.0–15.0)
Lymphocytes Relative: 31 %
Lymphs Abs: 0.9 10*3/uL (ref 0.7–4.0)
MCH: 26.8 pg (ref 26.0–34.0)
MCHC: 33.2 g/dL (ref 30.0–36.0)
MCV: 80.6 fL (ref 78.0–100.0)
Monocytes Absolute: 0.5 10*3/uL (ref 0.1–1.0)
Monocytes Relative: 17 %
Neutro Abs: 1.3 10*3/uL — ABNORMAL LOW (ref 1.7–7.7)
Neutrophils Relative %: 46 %
Platelets: 162 10*3/uL (ref 150–400)
RBC: 3.66 MIL/uL — ABNORMAL LOW (ref 3.87–5.11)
RDW: 17 % — ABNORMAL HIGH (ref 11.5–15.5)
WBC: 3 10*3/uL — ABNORMAL LOW (ref 4.0–10.5)

## 2014-12-31 LAB — BASIC METABOLIC PANEL
Anion gap: 10 (ref 5–15)
BUN: 9 mg/dL (ref 6–20)
CO2: 18 mmol/L — ABNORMAL LOW (ref 22–32)
Calcium: 8.5 mg/dL — ABNORMAL LOW (ref 8.9–10.3)
Chloride: 110 mmol/L (ref 101–111)
Creatinine, Ser: 0.94 mg/dL (ref 0.44–1.00)
GFR calc Af Amer: >60 mL/min (ref >60)
GFR calc non Af Amer: >60 mL/min (ref >60)
Glucose, Bld: 87 mg/dL (ref 65–99)
Potassium: 3.3 mmol/L — ABNORMAL LOW (ref 3.5–5.1)
Sodium: 138 mmol/L (ref 135–145)

## 2014-12-31 LAB — VITAMIN B12: Vitamin B-12: 101 pg/mL — ABNORMAL LOW (ref 180–914)

## 2014-12-31 LAB — RETICULOCYTES
RBC.: 3.66 MIL/uL — ABNORMAL LOW (ref 3.87–5.11)
Retic Count, Absolute: 22 10*3/uL (ref 19.0–186.0)
Retic Ct Pct: 0.6 % (ref 0.4–3.1)

## 2014-12-31 LAB — FOLATE: Folate: 13.2 ng/mL (ref >5.9)

## 2014-12-31 LAB — FERRITIN: Ferritin: 227 ng/mL (ref 11–307)

## 2014-12-31 MED ORDER — SODIUM CHLORIDE 0.9 % IV SOLN
40.0000 mg | Freq: Once | INTRAVENOUS | Status: DC
  Administered 2014-12-31: 40 mg via INTRAVENOUS

## 2014-12-31 MED ORDER — METHYLPREDNISOLONE SODIUM SUCC 125 MG IJ SOLR
80.0000 mg | Freq: Once | INTRAMUSCULAR | Status: AC
  Administered 2014-12-31: 80 mg via INTRAVENOUS
  Filled 2014-12-31: qty 2

## 2014-12-31 MED ORDER — LOPERAMIDE HCL 2 MG PO CAPS
2.0000 mg | ORAL_CAPSULE | ORAL | Status: DC | PRN
Start: 2014-12-31 — End: 2015-01-01
  Administered 2014-12-31: 2 mg via ORAL
  Filled 2014-12-31: qty 1

## 2014-12-31 MED ORDER — POTASSIUM CHLORIDE CRYS ER 20 MEQ PO TBCR
40.0000 meq | EXTENDED_RELEASE_TABLET | ORAL | Status: AC
  Administered 2014-12-31 (×2): 40 meq via ORAL
  Filled 2014-12-31 (×2): qty 2

## 2014-12-31 MED ORDER — FAMOTIDINE 20 MG PO TABS
40.0000 mg | ORAL_TABLET | Freq: Every day | ORAL | Status: DC
  Administered 2015-01-01: 40 mg via ORAL
  Filled 2014-12-31: qty 2

## 2014-12-31 MED ORDER — LEVALBUTEROL HCL 0.63 MG/3ML IN NEBU
0.6300 mg | INHALATION_SOLUTION | Freq: Two times a day (BID) | RESPIRATORY_TRACT | Status: DC
  Administered 2015-01-01: 0.63 mg via RESPIRATORY_TRACT
  Filled 2014-12-31: qty 3

## 2014-12-31 MED ORDER — STERILE WATER FOR INJECTION IV SOLN
INTRAVENOUS | Status: DC
  Administered 2014-12-31 – 2015-01-01 (×2): via INTRAVENOUS
  Filled 2014-12-31 (×2): qty 850

## 2014-12-31 MED ORDER — FAMOTIDINE IN NACL 20-0.9 MG/50ML-% IV SOLN
20.0000 mg | Freq: Once | INTRAVENOUS | Status: AC
  Administered 2014-12-31: 20 mg via INTRAVENOUS
  Filled 2014-12-31: qty 50

## 2014-12-31 MED ORDER — LOPERAMIDE HCL 2 MG PO CAPS
4.0000 mg | ORAL_CAPSULE | Freq: Once | ORAL | Status: AC
  Administered 2014-12-31: 4 mg via ORAL
  Filled 2014-12-31: qty 2

## 2014-12-31 MED ORDER — IMMUNE GLOBULIN (HUMAN) 20 GM/200ML IV SOLN
40.0000 g | INTRAVENOUS | Status: AC
  Administered 2014-12-31: 40 g via INTRAVENOUS
  Filled 2014-12-31: qty 400

## 2014-12-31 NOTE — Progress Notes (Signed)
[facility] PROGRESS NOTE  Emily Greer QIO:[id number] DOB: [birth year] DOA: 12/28/2014 PCP: Foye Spurling, MD  Assessment/Plan: #1 healthcare associated pneumonia Patient presented with fever, productive cough, chest x-ray findings consistent with a left lower lobe pneumonia. Patient with some clinical improvement. Blood cultures pending. Sputum cultures pending. Influenza PCR negative. Continue empiric IV vancomycin, IV Zosyn, Claritin, Mucinex, scheduled nebulizer treatments. Patient was given a dose of IV steroids by hematology oncology this morning. Will discontinue vancomycin after today's dose. Follow.  #2 hypokalemia Replete. Magnesium level at 2. Follow.  #3 anemia/neutropenia Likely secondary to acute infection and chemotherapy-induced. Patient with no overt bleeding. Follow H&H.  #4 urinary tract infection Urine cultures with multiple bacteria. Continue empiric Zosyn.  #5 hypertension Maxzide on hold. Continue Norvasc.  #6 multiple myeloma Status post bone marrow transplantation. Currently on pomalidomide. Being followed by Dr.Ennever. Patient has been seen by oncology and patient to receive IVIG today. Hematology/oncology following.   #7 prophylaxis Heparin for DVT prophylaxis.   Code Status: Full Family Communication: Updated patient and husband at bedside. Disposition Plan: Home when afebrile 24 hours and then oral medications with clinical improvement, hopefully 1-2 days.   Consultants:  Oncology: Dr. Marin Olp 12/31/2014  Procedures:  Chest x-ray 12/28/2014  IVIG 12/31/2014  Antibiotics:  IV vancomycin 12/29/2014  IV Zosyn 12/29/2014  HPI/Subjective: Patient states feeling better. Still with cough. Anxious to go home.  Objective: Filed Vitals:   12/31/14 1040 12/31/14 1120  BP: 142/77 143/78  Pulse: 83 79  Temp: 98.2 F (36.8 C) 98.3 F (36.8 C)  Resp: 18 18    Intake/Output Summary (Last 24 hours) at 12/31/14 1142 Last data  filed at 12/31/14 0900  Gross per 24 hour  Intake   3190 ml  Output    500 ml  Net   2690 ml   Filed Weights   12/28/14 2307  Weight: 82.555 kg (182 lb)    Exam:   General:  NAD  Cardiovascular: RRR  Respiratory: Some coarse breath sounds left greater than right. Some rhonchi. No wheezing.  Abdomen: Soft, nontender, nondistended, positive bowel sounds.  Musculoskeletal: No clubbing cyanosis or edema.  Data Reviewed: Basic Metabolic Panel:  Recent Labs Lab 12/29/14 0011 12/29/14 0455 12/30/14 0534 12/31/14 0537  NA 138  --  137 138  K 3.0*  --  3.6 3.3*  CL 102  --  108 110  CO2 26  --  23 18*  GLUCOSE 95  --  96 87  BUN 13  --  10 9  CREATININE 1.12*  --  1.10* 0.94  CALCIUM 9.0  --  8.5* 8.5*  MG  --  2.0  --   --    Liver Function Tests:  Recent Labs Lab 12/29/14 0011  AST 26  ALT 21  ALKPHOS 37*  BILITOT 0.5  PROT 7.2  ALBUMIN 3.9   No results for input(s): LIPASE, AMYLASE in the last 168 hours. No results for input(s): AMMONIA in the last 168 hours. CBC:  Recent Labs Lab 12/29/14 0011 12/30/14 0534 12/31/14 0537  WBC 4.4 2.9* 3.0*  NEUTROABS 2.7  --  1.3*  HGB 11.1* 9.6* 9.8*  HCT 33.8* 29.8* 29.5*  MCV 80.7 79.3 80.6  PLT 176 161 162   Cardiac Enzymes: No results for input(s): CKTOTAL, CKMB, CKMBINDEX, TROPONINI in the last 168 hours. BNP (last 3 results) No results for input(s): BNP in the last 8760 hours.  ProBNP (last 3 results) No results for input(s): PROBNP in the  last 8760 hours.  CBG: No results for input(s): GLUCAP in the last 168 hours.  Recent Results (from the past 240 hour(s))  Urine culture     Status: None   Collection Time: 12/23/14 11:56 AM  Result Value Ref Range Status   Urine Culture, Routine Culture, Urine  Final    Comment: Final - ===== COLONY COUNT: ===== 2,000 COLONIES/ML Insignificant Growth   Culture, blood (routine x 2)     Status: None (Preliminary result)   Collection Time: 12/29/14 12:10 AM   Result Value Ref Range Status   Specimen Description BLOOD BLOOD RIGHT FOREARM  Final   Special Requests BOTTLES DRAWN AEROBIC AND ANAEROBIC 5CC  Final   Culture   Final    NO GROWTH 1 DAY Performed at [facility]    Report Status PENDING  Incomplete  Culture, blood (routine x 2)     Status: None (Preliminary result)   Collection Time: 12/29/14 12:20 AM  Result Value Ref Range Status   Specimen Description BLOOD RIGHT HAND  Final   Special Requests BOTTLES DRAWN AEROBIC AND ANAEROBIC 5CC  Final   Culture   Final    NO GROWTH 1 DAY Performed at [facility]    Report Status PENDING  Incomplete  Urine culture     Status: None   Collection Time: 12/29/14 12:30 AM  Result Value Ref Range Status   Specimen Description URINE, CLEAN CATCH  Final   Special Requests NONE  Final   Culture   Final    MULTIPLE SPECIES PRESENT, SUGGEST RECOLLECTION Performed at [facility]    Report Status 12/30/2014 FINAL  Final  Culture, sputum-assessment     Status: None   Collection Time: 12/29/14 10:44 AM  Result Value Ref Range Status   Specimen Description SPUTUM  Final   Special Requests Immunocompromised  Final   Sputum evaluation   Final    THIS SPECIMEN IS ACCEPTABLE. RESPIRATORY CULTURE REPORT TO FOLLOW.   Report Status 12/29/2014 FINAL  Final  Culture, respiratory (NON-Expectorated)     Status: None   Collection Time: 12/29/14 10:44 AM  Result Value Ref Range Status   Specimen Description SPUTUM  Final   Special Requests NONE  Final   Gram Stain   Final    MODERATE WBC PRESENT, PREDOMINANTLY MONONUCLEAR FEW SQUAMOUS EPITHELIAL CELLS PRESENT ABUNDANT GRAM POSITIVE COCCI IN PAIRS IN CLUSTERS MODERATE GRAM NEGATIVE RODS RARE GRAM POSITIVE RODS    Culture   Final    NORMAL OROPHARYNGEAL FLORA Performed at [facility]    Report Status 12/31/2014 FINAL  Final     Studies: No results found.  Scheduled Meds: . acyclovir  400 mg Oral BID  .  amLODipine  10 mg Oral Daily  . cholecalciferol  2,000 Units Oral Daily  . [START ON 01/01/2015] famotidine  40 mg Oral Daily  . guaiFENesin  1,200 mg Oral BID  . heparin  5,000 Units Subcutaneous 3 times per day  . levalbuterol  0.63 mg Nebulization Q6H  . loperamide  4 mg Oral Once  . loratadine  10 mg Oral q morning - 10a  . oxymetazoline  1 spray Each Nare BID  . piperacillin-tazobactam (ZOSYN)  IV  3.375 g Intravenous 3 times per day  . potassium chloride  40 mEq Oral Q4H  . saccharomyces boulardii  250 mg Oral BID  . vancomycin  750 mg Intravenous Q12H   Continuous Infusions: .  sodium bicarbonate 150 mEq in  sterile water 1000 mL infusion 50 mL/hr at 12/31/14 1044    Principal Problem:   HCAP (healthcare-associated pneumonia) Active Problems:   Hypertension   Endometrial carcinoma ([place])   Fever   Anemia   Multiple myeloma ([place])   Bone marrow transplant status ([place])   UTI (lower urinary tract infection)   Hypokalemia   CAP (community acquired pneumonia)    Time spent: 74 minutes    THOMPSON,DANIEL M.D. [facility] Pager [phone]. If 7PM-7AM, please contact night-coverage at [email or web address], password [facility] 12/31/2014, 11:42 AM  LOS: 2 days

## 2014-12-31 NOTE — Consult Note (Signed)
Referral MD  Reason for Referral: Left lower lobe pneumonia; lambda light chain myeloma, status post stem cell transplant   Chief Complaint  Patient presents with  . chemo card   : I got pneumonia over Thanksgiving.  HPI: Mrs. Jamison is a very nice 64 year old African-American female. She is well-known to me. I have been following her probably for about 12 years. She has recurrent lambda light chain myeloma. She ultimately went to stem cell transplantation. This was her second stem cell transplant. He had this at [facility] in June of this year.  She is on maintenance therapy with Pomalidomide. She is on a very low dose. She also gets Velcade every 2 weeks.  She had a good Thanksgiving. However, afterwards, she began to feel tired and weak. She checked her temperature is 102.2. She went to see her family doctor. He subsequently sent her to the emergency room. She had a chest x-ray which showed an oval trait in the left lower lobe. She was then admitted on the 29th.  She's been on IV antibiotics.  Her labs on admission, show her white [place] before 0.4. Hemoglobin 11.1. Chem 176. Her BUN was 13 and creatinine 1.12. Calcium was 9 with an albumin of 3.9.  She is on IV antibiotics with making mycin and so soon. So far, cultures have been negative. She has noted some blood in the urine. Urine culture is also negative.  We do check her immunoglobulin levels. She does have a low IgG level of 689.  We were asked to see her try to help with any additional management issues.  She looks pretty good. She sounds good. She does have a little bit of wheezing.   Past Medical History  Diagnosis Date  . Lambda light chain myeloma ([place]) 11/11/2007  . Hypertension 11/28/97  . H/O multiple myeloma 11/28/1997  . Staphylococcus aureus bacteremia 11/28/1997  . Staphylococcus epidermidis bacteremia 11/28/97  . Pregnancy induced hypertension   . Osteoporosis 12/18/05    Increased  risk   . Increased BMI  12/18/05  . Post-menopausal bleeding 12/19/06  . Vaginal atrophy 12/19/06  . Elevated hemoglobin A1c     Borderline  . Fibroid 07/26/10    asymptomatic  . Vaginal atrophy   . PONV (postoperative nausea and vomiting)     nausea in past, none recent  . Endometrial carcinoma ([place]) 05/28/12  . History of radiation therapy 6/4, 6/11, 6/19, 6/25, 07/29/2012    vagina 30.5 gray in 5 fx, HDR brachytherapy  :  Past Surgical History  Procedure Laterality Date  . Tubal ligation  1986  . Ectopic pregnancy surgery  1992  . Hysteroscopy w/d&c  09/27/2011    Procedure: DILATATION AND CURETTAGE /HYSTEROSCOPY;  Surgeon: Eldred Manges, MD;  Location: [facility];  Service: Gynecology;;  . Laparotomy  10/31/2011    Procedure: EXPLORATORY LAPAROTOMY;  Surgeon: [name], MD PHD;  Location: [facility];  Service: Gynecology;  Laterality: N/A;  . Abdominal hysterectomy  10/31/2011    Procedure: HYSTERECTOMY ABDOMINAL;  Surgeon: [name], MD PHD;  Location: [facility];  Service: Gynecology;  Laterality: N/A;  . Salpingoophorectomy  10/31/2011    Procedure: SALPINGO OOPHERECTOMY;  Surgeon: [name], MD PHD;  Location: [facility];  Service: Gynecology;  Laterality: Bilateral;   Lymph Nodes sampling  :   Current facility-administered medications:  .  0.9 %  sodium chloride infusion, , Intravenous, Continuous, [name], MD, Last Rate: 100 mL/hr at 12/31/14 0416 .  acyclovir (ZOVIRAX) tablet  400 mg, 400 mg, Oral, BID, Ivor Costa, MD, 400 mg at 12/30/14 2149 .  albuterol (PROVENTIL) (2.5 MG/3ML) 0.083% nebulizer solution 2.5 mg, 2.5 mg, Nebulization, Q4H PRN, Ivor Costa, MD .  amLODipine (NORVASC) tablet 10 mg, 10 mg, Oral, Daily, Ivor Costa, MD, 10 mg at 12/30/14 [phone] .  benzonatate (TESSALON) capsule 200 mg, 200 mg, Oral, TID PRN, Ivor Costa, MD, 200 mg at 12/30/14 2149 .  cholecalciferol (VITAMIN D) tablet 2,000 Units, 2,000 Units, Oral, Daily, Ivor Costa, MD, 2,000 Units at 12/30/14 [phone] .  famotidine  (PEPCID) 40 mg in sodium chloride 0.9 % 50 mL IVPB, 40 mg, Intravenous, Once, [name], MD .  guaiFENesin [facility]) 12 hr tablet 1,200 mg, 1,200 mg, Oral, BID, Irine Seal V, MD, 1,200 mg at 12/30/14 2149 .  heparin injection 5,000 Units, 5,000 Units, Subcutaneous, 3 times per day, Ivor Costa, MD, 5,000 Units at 12/31/14 0539 .  hydrALAZINE (APRESOLINE) injection 5 mg, 5 mg, Intravenous, Q2H PRN, Ivor Costa, MD .  Immune Globulin 10% (OCTAGAM) IV infusion 40 g, 40 g, Intravenous, Q24H, [name], MD .  levalbuterol [facility]) nebulizer solution 0.63 mg, 0.63 mg, Nebulization, Q6H, Eugenie Filler, MD, 0.63 mg at 12/31/14 0127 .  loratadine (CLARITIN) tablet 10 mg, 10 mg, Oral, q morning - 10a, Ivor Costa, MD, 10 mg at 12/30/14 0160 .  methylPREDNISolone sodium succinate (SOLU-MEDROL) 125 mg/2 mL injection 80 mg, 80 mg, Intravenous, Once, [name], MD .  ondansetron [facility]) injection 4 mg, 4 mg, Intravenous, Q8H PRN, Ivor Costa, MD .  oxymetazoline (AFRIN) 0.05 % nasal spray 1 spray, 1 spray, Each Nare, BID, Ivor Costa, MD, 1 spray at 12/30/14 2149 .  piperacillin-tazobactam (ZOSYN) IVPB 3.375 g, 3.375 g, Intravenous, 3 times per day, Verlee Monte, MD, 3.375 g at 12/31/14 0539 .  saccharomyces boulardii (FLORASTOR) capsule 250 mg, 250 mg, Oral, BID, Eugenie Filler, MD, 250 mg at 12/30/14 2149 .  vancomycin (VANCOCIN) IVPB 750 mg/150 ml premix, 750 mg, Intravenous, Q12H, Verlee Monte, MD, 750 mg at 12/31/14 0539:  . acyclovir  400 mg Oral BID  . amLODipine  10 mg Oral Daily  . cholecalciferol  2,000 Units Oral Daily  . famotidine (PEPCID) IV  40 mg Intravenous Once  . guaiFENesin  1,200 mg Oral BID  . heparin  5,000 Units Subcutaneous 3 times per day  . IMMUNE GLOBULIN 10% (HUMAN) IV - For Fluid Restriction Only  40 g Intravenous Q24H  . levalbuterol  0.63 mg Nebulization Q6H  . loratadine  10 mg Oral q morning - 10a  . methylPREDNISolone (SOLU-MEDROL) injection  80 mg  Intravenous Once  . oxymetazoline  1 spray Each Nare BID  . piperacillin-tazobactam (ZOSYN)  IV  3.375 g Intravenous 3 times per day  . saccharomyces boulardii  250 mg Oral BID  . vancomycin  750 mg Intravenous Q12H  :  Allergies  Allergen Reactions  . Codeine Nausea Only  :  Family History  Problem Relation Age of Onset  . Colon cancer Mother   :  Social History   Social History  . Marital Status: Married    Spouse Name: N/A  . Number of Children: N/A  . Years of Education: N/A   Occupational History  . Not on file.   Social History Main Topics  . Smoking status: Never Smoker   . Smokeless tobacco: Never Used     Comment: never smoked  . Alcohol Use: No  . Drug  Use: No  . Sexual Activity: Yes    Birth Control/ Protection: Condom   Other Topics Concern  . Not on file   Social History Narrative  :  Pertinent items are noted in HPI.  Exam: Patient Vitals for the past 24 hrs:  BP Temp Temp src Pulse Resp SpO2  12/31/14 0653 117/77 mmHg 98.4 F (36.9 C) Oral 71 18 97 %  12/30/14 2132 (!) 144/66 mmHg 98.4 F (36.9 C) Oral 82 18 98 %  12/30/14 1349 (!) 113/52 mmHg 99 F (37.2 C) Oral 91 16 96 %  12/30/14 0800 114/63 mmHg 99.3 F (37.4 C) Oral 76 16 97 %    as above    Recent Labs  12/30/14 0534 12/31/14 0537  WBC 2.9* 3.0*  HGB 9.6* 9.8*  HCT 29.8* 29.5*  PLT 161 162    Recent Labs  12/30/14 0534 12/31/14 0537  NA 137 138  K 3.6 3.3*  CL 108 110  CO2 23 18*  GLUCOSE 96 87  BUN 10 9  CREATININE 1.10* 0.94  CALCIUM 8.5* 8.5*    Blood smear review:  None  Pathology: None     Assessment and Plan:  Ms. Beighley is a very nice 64 year old African-American female with a history of relapsed lambda light chain myeloma. She had a stem cell transplant at [facility] in June. This was autologous. She recovered quite nicely. She had a very nice response with respect to her light chain disease and her levels normalizing.  I think that she probably will  benefit from a dose of IVIG. Her IgG level is on the lower side. As such, I think that she would benefit from a dose of IVIG at 40 g.  I don't think that she is at risk for any atypical organisms, such as fungal pneumonia or pneumocystis. Her blood counts are okay. Her immune system for those infections should be adequate.  I will like to think though that she should be able to go home soon. She looks fairly stable. I would like to hope that she can be placed on oral antibiotics and then be able to be treated at home.  Otherwise, I don't think we need to do anything else with her.  I think her blood counts are down a little bit just because of her being hospitalized and having this underlying infection.  I talked to her at length this morning. I explained to her about the IVIG and how that works. She understands and agrees to take it.  We will follow along. I appreciate the outstanding care that she is receiving up on [address].!!  Lum Keas  Psalm 37:4-6

## 2015-01-01 ENCOUNTER — Inpatient Hospital Stay (HOSPITAL_COMMUNITY): Payer: BC Managed Care – PPO

## 2015-01-01 DIAGNOSIS — D500 Iron deficiency anemia secondary to blood loss (chronic): Secondary | ICD-10-CM | POA: Insufficient documentation

## 2015-01-01 DIAGNOSIS — D508 Other iron deficiency anemias: Secondary | ICD-10-CM

## 2015-01-01 DIAGNOSIS — Z8542 Personal history of malignant neoplasm of other parts of uterus: Secondary | ICD-10-CM

## 2015-01-01 DIAGNOSIS — N939 Abnormal uterine and vaginal bleeding, unspecified: Secondary | ICD-10-CM

## 2015-01-01 DIAGNOSIS — D509 Iron deficiency anemia, unspecified: Secondary | ICD-10-CM

## 2015-01-01 LAB — IGG, IGA, IGM
IgA: 91 mg/dL (ref 87–352)
IgG (Immunoglobin G), Serum: 596 mg/dL — ABNORMAL LOW (ref 700–1600)
IgM, Serum: 48 mg/dL (ref 26–217)

## 2015-01-01 LAB — CBC
HCT: 30.1 % — ABNORMAL LOW (ref 36.0–46.0)
Hemoglobin: 10 g/dL — ABNORMAL LOW (ref 12.0–15.0)
MCH: 26.2 pg (ref 26.0–34.0)
MCHC: 33.2 g/dL (ref 30.0–36.0)
MCV: 79 fL (ref 78.0–100.0)
Platelets: 185 10*3/uL (ref 150–400)
RBC: 3.81 MIL/uL — ABNORMAL LOW (ref 3.87–5.11)
RDW: 16.8 % — ABNORMAL HIGH (ref 11.5–15.5)
WBC: 3.8 10*3/uL — ABNORMAL LOW (ref 4.0–10.5)

## 2015-01-01 LAB — BASIC METABOLIC PANEL
Anion gap: 7 (ref 5–15)
BUN: 15 mg/dL (ref 6–20)
CO2: 26 mmol/L (ref 22–32)
Calcium: 8.9 mg/dL (ref 8.9–10.3)
Chloride: 106 mmol/L (ref 101–111)
Creatinine, Ser: 0.99 mg/dL (ref 0.44–1.00)
GFR calc Af Amer: >60 mL/min (ref >60)
GFR calc non Af Amer: 60 mL/min — ABNORMAL LOW (ref >60)
Glucose, Bld: 122 mg/dL — ABNORMAL HIGH (ref 65–99)
Potassium: 3.8 mmol/L (ref 3.5–5.1)
Sodium: 139 mmol/L (ref 135–145)

## 2015-01-01 LAB — LEGIONELLA PNEUMOPHILA SEROGP 1 UR AG: L. pneumophila Serogp 1 Ur Ag: NEGATIVE

## 2015-01-01 MED ORDER — GUAIFENESIN ER 600 MG PO TB12: 1200.0000 mg | ORAL_TABLET | Freq: Two times a day (BID) | ORAL | Status: DC

## 2015-01-01 MED ORDER — AMOXICILLIN-POT CLAVULANATE 875-125 MG PO TABS: 1.0000 | ORAL_TABLET | Freq: Two times a day (BID) | ORAL | Status: AC

## 2015-01-01 MED ORDER — SODIUM CHLORIDE 0.9 % IV SOLN
510.0000 mg | Freq: Once | INTRAVENOUS | Status: AC
  Administered 2015-01-01: 510 mg via INTRAVENOUS
  Filled 2015-01-01: qty 17

## 2015-01-01 MED ORDER — FERROUS SULFATE 325 (65 FE) MG PO TABS: 325.0000 mg | ORAL_TABLET | Freq: Two times a day (BID) | ORAL | Status: DC

## 2015-01-01 MED ORDER — AMOXICILLIN-POT CLAVULANATE 875-125 MG PO TABS
1.0000 | ORAL_TABLET | Freq: Two times a day (BID) | ORAL | Status: DC
  Administered 2015-01-01: 1 via ORAL
  Filled 2015-01-01: qty 1

## 2015-01-01 MED ORDER — ALBUTEROL SULFATE HFA 108 (90 BASE) MCG/ACT IN AERS: 2.0000 | INHALATION_SPRAY | Freq: Four times a day (QID) | RESPIRATORY_TRACT | Status: DC | PRN

## 2015-01-01 MED ORDER — PREDNISONE 20 MG PO TABS
60.0000 mg | ORAL_TABLET | Freq: Once | ORAL | Status: AC
  Administered 2015-01-01: 60 mg via ORAL
  Filled 2015-01-01: qty 3

## 2015-01-01 MED ORDER — SACCHAROMYCES BOULARDII 250 MG PO CAPS: 250.0000 mg | ORAL_CAPSULE | Freq: Two times a day (BID) | ORAL | Status: DC

## 2015-01-01 NOTE — Progress Notes (Signed)
Emily Greer is feeling better. She tolerated the IVIG quite well.  She has had some degree of iron deficiency. I will go ahead and give her a dose of Feraheme. I think this will help her. She does feel a low bit tired. She still is having some blood down in the pelvic area. I think that a pelvic ultrasound would be appropriate.  She's had no cough. There's been no fever.  Her labs looked okay today. Her white cell count 3.8. Hemoglobin 10.  On her physical exam, her vital signs are all stable. She is afebrile. Her blood pressure is 155/75. Her lungs sound clear bilaterally. There may be some occasional expiratory wheezes. Cardiac exam regular rate and rhythm with no murmurs rubs or bruits. Abdomen is soft. She has good bowel sounds. Extremities shows no clubbing, cyanosis or edema. Skin exam shows no rashes.  Everything looks okay from my point of view. Again we will give her the iron.  I'm not sure what the bleeding is. A urine culture done on the 29th showed multiple specimens. Another culture done on the 23rd was negative. She has had a history of endometrial cancer. As such, I think a pelvic ultrasound would be reasonable.  I very much appreciate the great care that she is getting for everybody up on [place] are doing a fantastic job with her.  I would like to think that she will be able to go home today.  Pete E.  Ephesians 2:8-9

## 2015-01-01 NOTE — Discharge Summary (Signed)
Physician Discharge Summary  Emily Greer FTD:[id number] DOB: August 18, 1950 DOA: 12/28/2014  PCP: Emily Spurling, MD  Admit date: 12/28/2014 Discharge date: 01/01/2015  Time spent: 65 minutes  Recommendations for Outpatient Follow-up:  1. Follow-up with Emily Spurling, MD in 1-2 weeks. On follow-up patient needs a basic metabolic profile done to follow-up on electrolytes and renal function. Patient will need a CBC done to follow-up on H&H. Patient's pneumonia will need to be reassessed. 2. Follow-up with GYN in one to 2 weeks. On follow-up pelvic ultrasound results will need to be followed up upon. 3. Follow-up with hematology/oncology, Emily Greer as scheduled.   Discharge Diagnoses:  Principal Problem:   HCAP (healthcare-associated pneumonia) Active Problems:   Hypertension   Endometrial carcinoma ([place])   Fever   Anemia   Multiple myeloma ([place])   Bone marrow transplant status ([place])   UTI (lower urinary tract infection)   Hypokalemia   CAP (community acquired pneumonia)   Discharge Condition: Stable and improved  Diet recommendation: Regular  Filed Weights   12/28/14 2307  Weight: 82.555 kg (182 lb)    History of present illness:  Per Dr Emily Greer is a 64 y.o. female with PMH of hypertension, multiple myeloma, s/p of bone marrow transplantation, currently on pomalidomide, endometrial cancer (S/p of radiation therapy), osteoporosis, who presented with fever, cough and burning on urination.  Patient reported that since 1 day prior to admission, she started having fever with temperature 102 at home. She had cough with clear mucus production. She did not have chest pain or shortness of breath. She reported having burning on urination, but no urinary frequency and dysuria. She did not have abdominal pain, diarrhea, unilateral weakness. She reported that she was recently treated with azithromycin by her oncologist for possible pneumonia. Patient stated that she  just completed 21-day of Pomalidomide today and was going to have 7-day off the next day.   In ED, patient was found to have lactate 1.16, WBC 4.4, hemoglobin 11.1, temperature 100.7, no tachycardia, no tachypnea, potassium 3.0, urinalysis with moderate amount of leukocytes. Chest x-ray showed patchy LLL infiltration. Patient was admitted to inpatient for further eval and treatment.   Hospital Course:  #1 healthcare associated pneumonia Patient presented with fever, productive cough, chest x-ray findings consistent with a left lower lobe pneumonia. Patient was placed empirically on IV vancomycin and IV Zosyn. Patient was pancultured with cultures were no growth to date. Influenza PCR negative. Patient was also placed on Mucinex, Claritin and scheduled nebulizer treatments. Patient improved clinically and was subsequently transitioned to oral Augmentin. Patient was discharged home on 5 more days of oral Augmentin to complete a course of antibiotic therapy. Patient is to follow-up with PCP as outpatient.  #2 hypokalemia Repleted.  #3 anemia/neutropenia/iron deficiency anemia Likely secondary to acute infection and chemotherapy-induced. Patient did state she had some blood from the pelvic region and unable to state whether it was urinary or vaginal. Patient's hemoglobin however stabilized. Anemia panel which was done was consistent with a iron deficiency anemia. Patient was given IV iron per hematology recommendations prior to discharge. Patient will be discharged on oral iron supplementation. Outpatient follow-up.   #4 urinary tract infection Urine cultures with multiple bacteria. Patient was empirically on IV antibiotics.   #5 hypertension Maxzide was held. Patient was maintained on Norvasc. Outpatient follow up.  #6 multiple myeloma Status post bone marrow transplantation. Currently on pomalidomide. Being followed by [facility]. Patient was seen by oncology and patient received IVIG on  12/31/2014. Patient will follow up with Hematology/oncology as outpatient.   #7 blood in urine versus vaginal bleed During the hospitalization patient did have some complaints of blood from the pelvic region and unable to state as to whether was in a urine versus vaginal. Urinalysis did have 6-30 rbc's noted. Urinalysis was worrisome for UTI and patient was placed on IV antibiotics empirically. Urine cultures had multiple bacterial morphotypes. Due to patient's history of endometrial cancer pelvic ultrasound was ordered per hematology/oncology. Patient need to follow-up with the GYN as outpatient.   Procedures:  Chest x-ray 12/28/2014  IVIG 12/31/2014  Pelvic US 01/01/2015 pending.  Consultations:  Oncology: Emily Greer 12/31/2014    Discharge Exam: Filed Vitals:   01/01/15 0516 01/01/15 1036  BP: 155/75 126/64  Pulse: 60   Temp: 97.9 F (36.6 C)   Resp: 20     General: NAD Cardiovascular: RRR Respiratory: CTAB  Discharge Instructions   Discharge Instructions    Diet general    Complete by:  As directed      Discharge instructions    Complete by:  As directed   Follow up with Emily Spurling, MD in 2 weeks Follow up with GYN in 1-2 weeks. Follow up with Dr Emily Greer as scheduled.     Increase activity slowly    Complete by:  As directed           Current Discharge Medication List    START taking these medications   Details  albuterol (PROVENTIL HFA;VENTOLIN HFA) 108 (90 BASE) MCG/ACT inhaler Inhale 2 puffs into the lungs every 6 (six) hours as needed for wheezing or shortness of breath. 2 puffs 3 times daily x 5 days then every 6 hours as needed. Qty: 1 Inhaler, Refills: 0    amoxicillin-clavulanate (AUGMENTIN) 875-125 MG tablet Take 1 tablet by mouth every 12 (twelve) hours. Take for 5 days then stop. Qty: 10 tablet, Refills: 0    ferrous sulfate 325 (65 FE) MG tablet Take 1 tablet (325 mg total) by mouth 2 (two) times daily with a meal. Refills: 3     guaiFENesin (MUCINEX) 600 MG 12 hr tablet Take 2 tablets (1,200 mg total) by mouth 2 (two) times daily. Take for 5 days then stop. Qty: 20 tablet, Refills: 0    saccharomyces boulardii (FLORASTOR) 250 MG capsule Take 1 capsule (250 mg total) by mouth 2 (two) times daily.      CONTINUE these medications which have NOT CHANGED   Details  acyclovir (ZOVIRAX) 400 MG tablet TAKE 1 TABLET BY MOUTH TWICE DAILY Qty: 60 tablet, Refills: 0    amLODipine (NORVASC) 10 MG tablet TAKE 1 TABLET BY MOUTH DAILY   Associated Diagnoses: Multiple myeloma in relapse (HCC)    benzonatate (TESSALON) 200 MG capsule Take 200 mg by mouth 3 (three) times daily as needed for cough.    Cholecalciferol (VITAMIN D3) 2000 UNITS TABS Take 1 tablet by mouth daily.    loratadine (CLARITIN) 10 MG tablet Take 10 mg by mouth every morning.     oxymetazoline (AFRIN) 0.05 % nasal spray Place 1 spray into both nostrils 2 (two) times daily.    pomalidomide (POMALYST) 1 MG capsule Take with water on days 1-21. Repeat every 28 days. [id number] Qty: 21 capsule, Refills: 0   Associated Diagnoses: Multiple myeloma in relapse (HCC)    potassium chloride SA (K-DUR,KLOR-CON) 20 MEQ tablet Take 20 mEq by mouth 2 (two) times daily.    Associated Diagnoses: Essential hypertension; Lambda light chain  myeloma (HCC)    triamterene-hydrochlorothiazide (MAXZIDE-25) 37.5-25 MG per tablet Take 1 tablet by mouth daily. Qty: 30 tablet, Refills: 6   Associated Diagnoses: Essential hypertension; Lambda light chain myeloma (HCC)       Allergies  Allergen Reactions  . Codeine Nausea Only   Follow-up Information    Follow up with Emily Spurling, MD. Schedule an appointment as soon as possible for a visit in 2 weeks.   Specialty:  Internal Medicine   Contact information:   [address] [place] [place] [address] [phone]       Follow up with [name], MD.   Specialty:  Oncology   Why:  f/u as scheduled    Contact information:   [address], [address] [place] [address] [phone]       Please follow up.   Why:  f/u with GYN in 1-2 weeks       The results of significant diagnostics from this hospitalization (including imaging, microbiology, ancillary and laboratory) are listed below for reference.    Significant Diagnostic Studies: Dg Chest 2 View  12/29/2014  CLINICAL DATA:  Fever and cough for 24 hours. EXAM: CHEST  2 VIEW COMPARISON:  05/31/2014 FINDINGS: Patchy opacity in the left lower lobe may represent early infectious infiltrate. The right lung is clear. There is no pleural effusion. Heart size is normal. Hilar and mediastinal contours are unremarkable. Pulmonary vasculature is normal. IMPRESSION: Patchy left lower lobe opacity, possibly pneumonia. Follow-up radiography in 3-4 weeks is recommended to assure resolution. Electronically Signed   By: Andreas Newport M.D.   On: 12/29/2014 00:32    Microbiology: Recent Results (from the past 240 hour(s))  Urine culture     Status: None   Collection Time: 12/23/14 11:56 AM  Result Value Ref Range Status   Urine Culture, Routine Culture, Urine  Final    Comment: Final - ===== COLONY COUNT: ===== 2,000 COLONIES/ML Insignificant Growth   Culture, blood (routine x 2)     Status: None (Preliminary result)   Collection Time: 12/29/14 12:10 AM  Result Value Ref Range Status   Specimen Description BLOOD BLOOD RIGHT FOREARM  Final   Special Requests BOTTLES DRAWN AEROBIC AND ANAEROBIC 5CC  Final   Culture   Final    NO GROWTH 2 DAYS Performed at [facility]    Report Status PENDING  Incomplete  Culture, blood (routine x 2)     Status: None (Preliminary result)   Collection Time: 12/29/14 12:20 AM  Result Value Ref Range Status   Specimen Description BLOOD RIGHT HAND  Final   Special Requests BOTTLES DRAWN AEROBIC AND ANAEROBIC 5CC  Final   Culture   Final    NO GROWTH 2 DAYS Performed at [facility]     Report Status PENDING  Incomplete  Urine culture     Status: None   Collection Time: 12/29/14 12:30 AM  Result Value Ref Range Status   Specimen Description URINE, CLEAN CATCH  Final   Special Requests NONE  Final   Culture   Final    MULTIPLE SPECIES PRESENT, SUGGEST RECOLLECTION Performed at [facility]    Report Status 12/30/2014 FINAL  Final  Culture, sputum-assessment     Status: None   Collection Time: 12/29/14 10:44 AM  Result Value Ref Range Status   Specimen Description SPUTUM  Final   Special Requests Immunocompromised  Final   Sputum evaluation   Final    THIS SPECIMEN  IS ACCEPTABLE. RESPIRATORY CULTURE REPORT TO FOLLOW.   Report Status 12/29/2014 FINAL  Final  Culture, respiratory (NON-Expectorated)     Status: None   Collection Time: 12/29/14 10:44 AM  Result Value Ref Range Status   Specimen Description SPUTUM  Final   Special Requests NONE  Final   Gram Stain   Final    MODERATE WBC PRESENT, PREDOMINANTLY MONONUCLEAR FEW SQUAMOUS EPITHELIAL CELLS PRESENT ABUNDANT GRAM POSITIVE COCCI IN PAIRS IN CLUSTERS MODERATE GRAM NEGATIVE RODS RARE GRAM POSITIVE RODS    Culture   Final    NORMAL OROPHARYNGEAL FLORA Performed at [facility]    Report Status 12/31/2014 FINAL  Final     Labs: Basic Metabolic Panel:  Recent Labs Lab 12/29/14 0011 12/29/14 0455 12/30/14 0534 12/31/14 0537 01/01/15 0449  NA 138  --  137 138 139  K 3.0*  --  3.6 3.3* 3.8  CL 102  --  108 110 106  CO2 26  --  23 18* 26  GLUCOSE 95  --  96 87 122*  BUN 13  --  _0 CREATININE 1.12*  --  1.10* 0.94 0.99  CALCIUM 9.0  --  8.5* 8.5* 8.9  MG  --  2.0  --   --   --    Liver Function Tests:  Recent Labs Lab 12/29/14 0011  AST 26  ALT 21  ALKPHOS 37*  BILITOT 0.5  PROT 7.2  ALBUMIN 3.9   No results for input(s): LIPASE, AMYLASE in the last 168 hours. No results for input(s): AMMONIA in the last 168 hours. CBC:  Recent Labs Lab 12/29/14 0011  12/30/14 0534 12/31/14 0537 01/01/15 0449  WBC 4.4 2.9* 3.0* 3.8*  NEUTROABS 2.7  --  1.3*  --   HGB 11.1* 9.6* 9.8* 10.0*  HCT 33.8* 29.8* 29.5* 30.1*  MCV 80.7 79.3 80.6 79.0  PLT 176 161 162 185   Cardiac Enzymes: No results for input(s): CKTOTAL, CKMB, CKMBINDEX, TROPONINI in the last 168 hours. BNP: BNP (last 3 results) No results for input(s): BNP in the last 8760 hours.  ProBNP (last 3 results) No results for input(s): PROBNP in the last 8760 hours.  CBG: No results for input(s): GLUCAP in the last 168 hours.     SignedIrine Seal MD [facility] 01/01/2015, 12:09 PM

## 2015-01-03 LAB — CULTURE, BLOOD (ROUTINE X 2)
Culture: NO GROWTH
Culture: NO GROWTH

## 2015-01-04 ENCOUNTER — Ambulatory Visit: Payer: BC Managed Care – PPO

## 2015-01-04 ENCOUNTER — Other Ambulatory Visit (HOSPITAL_BASED_OUTPATIENT_CLINIC_OR_DEPARTMENT_OTHER): Payer: BC Managed Care – PPO

## 2015-01-04 ENCOUNTER — Ambulatory Visit (HOSPITAL_BASED_OUTPATIENT_CLINIC_OR_DEPARTMENT_OTHER): Payer: BC Managed Care – PPO | Admitting: Hematology & Oncology

## 2015-01-04 VITALS — BP 123/69 | HR 101 | Temp 98.0°F | Wt 182.0 lb

## 2015-01-04 DIAGNOSIS — Z9484 Stem cells transplant status: Secondary | ICD-10-CM

## 2015-01-04 DIAGNOSIS — C9000 Multiple myeloma not having achieved remission: Secondary | ICD-10-CM

## 2015-01-04 DIAGNOSIS — Z8542 Personal history of malignant neoplasm of other parts of uterus: Secondary | ICD-10-CM

## 2015-01-04 DIAGNOSIS — C9001 Multiple myeloma in remission: Secondary | ICD-10-CM

## 2015-01-04 LAB — CBC WITH DIFFERENTIAL (CANCER CENTER ONLY)
BASO#: 0 10*3/uL (ref 0.0–0.2)
BASO%: 0.7 % (ref 0.0–2.0)
EOS%: 1.9 % (ref 0.0–7.0)
Eosinophils Absolute: 0.1 10*3/uL (ref 0.0–0.5)
HCT: 35.5 % (ref 34.8–46.6)
HGB: 11.9 g/dL (ref 11.6–15.9)
LYMPH#: 1.4 10*3/uL (ref 0.9–3.3)
LYMPH%: 32.7 % (ref 14.0–48.0)
MCH: 26.4 pg (ref 26.0–34.0)
MCHC: 33.5 g/dL (ref 32.0–36.0)
MCV: 79 fL — ABNORMAL LOW (ref 81–101)
MONO#: 0.9 10*3/uL (ref 0.1–0.9)
MONO%: 20.2 % — ABNORMAL HIGH (ref 0.0–13.0)
NEUT#: 1.9 10*3/uL (ref 1.5–6.5)
NEUT%: 44.5 % (ref 39.6–80.0)
Platelets: 326 10*3/uL (ref 145–400)
RBC: 4.5 10*6/uL (ref 3.70–5.32)
RDW: 16.4 % — ABNORMAL HIGH (ref 11.1–15.7)
WBC: 4.3 10*3/uL (ref 3.9–10.0)

## 2015-01-04 LAB — TECHNOLOGIST REVIEW CHCC SATELLITE

## 2015-01-04 NOTE — Progress Notes (Signed)
Hematology and Oncology Follow Up Visit  Emily Greer KT:[id number] [birth year] 64 y.o. 01/04/2015   Principle Diagnosis:   Recurrent lambda light chain myeloma  History of stage I endometrial carcinoma  Current Therapy:    Status post second autologous stem cell transplant on 07/24/2014  Maintenance therapy with Pomalidomide/every 2 week Velcade  Aredia 60 mg IV Q. 3 month     Interim History:  [name] is back for followup. Unfortunately, she was hospitalized with pneumonia. She discussed the hospital 2 days ago. I did give her dose of IVIG in the hospital as her IgG level was on the low side. Peripheral also gave her a dose of IV iron.  She is feeling better. She is on outpatient Augmentin.  She has had some issues with bleeding from the genital region. She will see her gynecologist. We did do a transvaginal ultrasound in the hospital. This was basically unremarkable. I don't know she has some urethritis. Again, she will see Emily Greer, who is her gynecologist.  Her appetite is coming back. She does feel a little better. I told her to still take it easy.   She has no cough.   Overall, her performance status is ECOG 1.  Medications: Allergies:  Allergies  Allergen Reactions  . Codeine Nausea Only    Past Medical History, Surgical history, Social history, and Family History were reviewed and updated.  Review of Systems: As above  Physical Exam:  weight is 182 lb 0.6 oz (82.573 kg). Her oral temperature is 98 F (36.7 C). Her blood pressure is 123/69 and her pulse is 101.   Well-developed and well-nourished [name]. Head and neck exam shows no ocular or oral lesion. There are no palpable cervical or supraclavicular lymph nodes. Lungs are clear. Cardiac exam regular rate and rhythm with no murmurs rubs or bruits. Abdomen is soft. She has good bowel sounds. There is no fluid wave. There is no palpable liver or spleen tip. Extremities shows no  clubbing, cyanosis or edema. Neurological exam shows no focal neurological deficits. Skin exam no rashes, ecchymosis or petechia.  Lab Results  Component Value Date   WBC 4.3 01/04/2015   HGB 11.9 01/04/2015   HCT 35.5 01/04/2015   MCV 79* 01/04/2015   PLT 326 01/04/2015     Chemistry      Component Value Date/Time   NA 139 01/01/2015 0449   NA 143 12/21/2014 1107   NA 142 09/09/2014 0824   K 3.8 01/01/2015 0449   K 3.1* 12/21/2014 1107   K 3.3* 09/09/2014 0824   CL 106 01/01/2015 0449   CL 102 12/21/2014 1107   CO2 26 01/01/2015 0449   CO2 28 12/21/2014 1107   CO2 23 09/09/2014 0824   BUN 15 01/01/2015 0449   BUN 16 12/21/2014 1107   BUN 13.5 09/09/2014 0824   CREATININE 0.99 01/01/2015 0449   CREATININE 1.1 12/21/2014 1107   CREATININE 0.8 09/09/2014 0824      Component Value Date/Time   CALCIUM 8.9 01/01/2015 0449   CALCIUM 9.8 12/21/2014 1107   CALCIUM 9.7 09/09/2014 0824   ALKPHOS 37* 12/29/2014 0011   ALKPHOS 48 12/21/2014 1107   ALKPHOS 62 09/09/2014 0824   AST 26 12/29/2014 0011   AST 22 12/21/2014 1107   AST 18 09/09/2014 0824   ALT 21 12/29/2014 0011   ALT 18 12/21/2014 1107   ALT 18 09/09/2014 0824   BILITOT 0.5 12/29/2014 0011   BILITOT 0.60 12/21/2014 1107  BILITOT 0.32 09/09/2014 [id number]         Impression and Plan: Emily Greer is 64 year old female. She has recurrent lambda light chain myeloma.  I would like to put her chemotherapy on for 2 weeks. She disc out of the hospital with pneumonia. I really think it would be advantageous to her to be off 2 more weeks before we restart treatment. She is agreeable to this.  I will plan to see her back myself in January. She will get Aredia in January. .   I spent about 30 minutes with her today   . [name], MD 12/5/20161:28 PM

## 2015-01-04 NOTE — Progress Notes (Signed)
No treatment today per Dr. Marin Olp orders .

## 2015-01-05 ENCOUNTER — Other Ambulatory Visit: Payer: Self-pay | Admitting: Hematology & Oncology

## 2015-01-13 ENCOUNTER — Other Ambulatory Visit: Payer: Self-pay | Admitting: Internal Medicine

## 2015-01-13 ENCOUNTER — Ambulatory Visit (HOSPITAL_COMMUNITY)
Admission: RE | Admit: 2015-01-13 | Discharge: 2015-01-13 | Disposition: A | Discharge status: Home / Self Care | Payer: BC Managed Care – PPO | Source: Ambulatory Visit | Attending: Internal Medicine | Admitting: Internal Medicine

## 2015-01-13 DIAGNOSIS — J189 Pneumonia, unspecified organism: Secondary | ICD-10-CM | POA: Insufficient documentation

## 2015-01-15 ENCOUNTER — Other Ambulatory Visit: Payer: Self-pay | Admitting: *Deleted

## 2015-01-15 DIAGNOSIS — C9001 Multiple myeloma in remission: Secondary | ICD-10-CM

## 2015-01-18 ENCOUNTER — Ambulatory Visit (HOSPITAL_BASED_OUTPATIENT_CLINIC_OR_DEPARTMENT_OTHER): Payer: BC Managed Care – PPO

## 2015-01-18 ENCOUNTER — Other Ambulatory Visit (HOSPITAL_BASED_OUTPATIENT_CLINIC_OR_DEPARTMENT_OTHER): Payer: BC Managed Care – PPO

## 2015-01-18 VITALS — BP 123/74 | HR 90 | Temp 98.2°F | Resp 20 | Wt 191.0 lb

## 2015-01-18 DIAGNOSIS — Z5112 Encounter for antineoplastic immunotherapy: Secondary | ICD-10-CM

## 2015-01-18 DIAGNOSIS — C9001 Multiple myeloma in remission: Secondary | ICD-10-CM

## 2015-01-18 DIAGNOSIS — C9002 Multiple myeloma in relapse: Secondary | ICD-10-CM

## 2015-01-18 DIAGNOSIS — C9000 Multiple myeloma not having achieved remission: Secondary | ICD-10-CM

## 2015-01-18 LAB — CBC WITH DIFFERENTIAL (CANCER CENTER ONLY)
BASO#: 0.1 10*3/uL (ref 0.0–0.2)
BASO%: 1 % (ref 0.0–2.0)
EOS%: 5.1 % (ref 0.0–7.0)
Eosinophils Absolute: 0.3 10*3/uL (ref 0.0–0.5)
HCT: 33 % — ABNORMAL LOW (ref 34.8–46.6)
HGB: 10.8 g/dL — ABNORMAL LOW (ref 11.6–15.9)
LYMPH#: 1.2 10*3/uL (ref 0.9–3.3)
LYMPH%: 24.4 % (ref 14.0–48.0)
MCH: 26.7 pg (ref 26.0–34.0)
MCHC: 32.7 g/dL (ref 32.0–36.0)
MCV: 82 fL (ref 81–101)
MONO#: 0.9 10*3/uL (ref 0.1–0.9)
MONO%: 18.9 % — ABNORMAL HIGH (ref 0.0–13.0)
NEUT#: 2.5 10*3/uL (ref 1.5–6.5)
NEUT%: 50.6 % (ref 39.6–80.0)
Platelets: 199 10*3/uL (ref 145–400)
RBC: 4.05 10*6/uL (ref 3.70–5.32)
RDW: 17.8 % — ABNORMAL HIGH (ref 11.1–15.7)
WBC: 4.9 10*3/uL (ref 3.9–10.0)

## 2015-01-18 LAB — COMPREHENSIVE METABOLIC PANEL
ALT: 20 U/L (ref 0–55)
AST: 15 U/L (ref 5–34)
Albumin: 3.8 g/dL (ref 3.5–5.0)
Alkaline Phosphatase: 47 U/L (ref 40–150)
Anion Gap: 9 mEq/L (ref 3–11)
BUN: 18.7 mg/dL (ref 7.0–26.0)
CO2: 24 mEq/L (ref 22–29)
Calcium: 9.5 mg/dL (ref 8.4–10.4)
Chloride: 106 mEq/L (ref 98–109)
Creatinine: 1 mg/dL (ref 0.6–1.1)
EGFR: 71 mL/min/{1.73_m2} — ABNORMAL LOW (ref >90)
Glucose: 82 mg/dl (ref 70–140)
Potassium: 3.7 mEq/L (ref 3.5–5.1)
Sodium: 139 mEq/L (ref 136–145)
Total Bilirubin: 0.49 mg/dL (ref 0.20–1.20)
Total Protein: 7.1 g/dL (ref 6.4–8.3)

## 2015-01-18 MED ORDER — ONDANSETRON HCL 8 MG PO TABS
8.0000 mg | ORAL_TABLET | Freq: Once | ORAL | Status: AC
  Administered 2015-01-18: 8 mg via ORAL

## 2015-01-18 MED ORDER — BORTEZOMIB CHEMO SQ INJECTION 3.5 MG (2.5MG/ML)
1.2000 mg/m2 | Freq: Once | INTRAMUSCULAR | Status: AC
  Administered 2015-01-18: 2.5 mg via SUBCUTANEOUS
  Filled 2015-01-18: qty 2.5

## 2015-01-18 MED ORDER — BORTEZOMIB CHEMO SQ INJECTION 3.5 MG (2.5MG/ML): 1.3000 mg/m2 | Freq: Once | INTRAMUSCULAR | Status: DC

## 2015-01-18 MED ORDER — ONDANSETRON HCL 8 MG PO TABS
ORAL_TABLET | ORAL | Status: AC
  Filled 2015-01-18: qty 1

## 2015-01-18 MED ORDER — ONDANSETRON HCL 8 MG PO TABS
8.0000 mg | ORAL_TABLET | Freq: Once | ORAL | Status: DC
Start: 2015-01-18 — End: 2015-01-18

## 2015-01-18 NOTE — Patient Instructions (Signed)
Bortezomib injection What is this medicine? BORTEZOMIB (bor TEZ oh mib) is a medicine that targets proteins in cancer cells and stops the cancer cells from growing. It is used to treat multiple myeloma and mantle-cell lymphoma. This medicine may be used for other purposes; ask your health care provider or pharmacist if you have questions. What should I tell my health care provider before I take this medicine? They need to know if you have any of these conditions: -diabetes -heart disease -irregular heartbeat -liver disease -on hemodialysis -low blood counts, like low white blood cells, platelets, or hemoglobin -peripheral neuropathy -taking medicine for blood pressure -an unusual or allergic reaction to bortezomib, mannitol, boron, other medicines, foods, dyes, or preservatives -pregnant or trying to get pregnant -breast-feeding How should I use this medicine? This medicine is for injection into a vein or for injection under the skin. It is given by a health care professional in a hospital or clinic setting. Talk to your pediatrician regarding the use of this medicine in children. Special care may be needed. Overdosage: If you think you have taken too much of this medicine contact a poison control center or emergency room at once. NOTE: This medicine is only for you. Do not share this medicine with others. What if I miss a dose? It is important not to miss your dose. Call your doctor or health care professional if you are unable to keep an appointment. What may interact with this medicine? This medicine may interact with the following medications: -ketoconazole -rifampin -ritonavir -St. John's Wort This list may not describe all possible interactions. Give your health care provider a list of all the medicines, herbs, non-prescription drugs, or dietary supplements you use. Also tell them if you smoke, drink alcohol, or use illegal drugs. Some items may interact with your medicine. What  should I watch for while using this medicine? Visit your doctor for checks on your progress. This drug may make you feel generally unwell. This is not uncommon, as chemotherapy can affect healthy cells as well as cancer cells. Report any side effects. Continue your course of treatment even though you feel ill unless your doctor tells you to stop. You may get drowsy or dizzy. Do not drive, use machinery, or do anything that needs mental alertness until you know how this medicine affects you. Do not stand or sit up quickly, especially if you are an older patient. This reduces the risk of dizzy or fainting spells. In some cases, you may be given additional medicines to help with side effects. Follow all directions for their use. Call your doctor or health care professional for advice if you get a fever, chills or sore throat, or other symptoms of a cold or flu. Do not treat yourself. This drug decreases your body's ability to fight infections. Try to avoid being around people who are sick. This medicine may increase your risk to bruise or bleed. Call your doctor or health care professional if you notice any unusual bleeding. You may need blood work done while you are taking this medicine. In some patients, this medicine may cause a serious brain infection that may cause death. If you have any problems seeing, thinking, speaking, walking, or standing, tell your doctor right away. If you cannot reach your doctor, urgently seek other source of medical care. Do not become pregnant while taking this medicine. Women should inform their doctor if they wish to become pregnant or think they might be pregnant. There is a potential for serious  side effects to an unborn child. Talk to your health care professional or pharmacist for more information. Do not breast-feed an infant while taking this medicine. Check with your doctor or health care professional if you get an attack of severe diarrhea, nausea and vomiting, or if  you sweat a lot. The loss of too much body fluid can make it dangerous for you to take this medicine. What side effects may I notice from receiving this medicine? Side effects that you should report to your doctor or health care professional as soon as possible: -allergic reactions like skin rash, itching or hives, swelling of the face, lips, or tongue -breathing problems -changes in hearing -changes in vision -fast, irregular heartbeat -feeling faint or lightheaded, falls -pain, tingling, numbness in the hands or feet -right upper belly pain -seizures -swelling of the ankles, feet, hands -unusual bleeding or bruising -unusually weak or tired -vomiting -yellowing of the eyes or skin Side effects that usually do not require medical attention (report to your doctor or health care professional if they continue or are bothersome): -changes in emotions or moods -constipation -diarrhea -loss of appetite -headache -irritation at site where injected -nausea This list may not describe all possible side effects. Call your doctor for medical advice about side effects. You may report side effects to FDA at 1-800-FDA-1088. Where should I keep my medicine? This drug is given in a hospital or clinic and will not be stored at home. NOTE: This sheet is a summary. It may not cover all possible information. If you have questions about this medicine, talk to your doctor, pharmacist, or health care provider.    2016, Elsevier/Gold Standard. (2014-03-17 14:47:04)

## 2015-01-21 ENCOUNTER — Other Ambulatory Visit: Payer: Self-pay | Admitting: Obstetrics and Gynecology

## 2015-01-21 ENCOUNTER — Other Ambulatory Visit: Payer: Self-pay | Admitting: *Deleted

## 2015-01-21 DIAGNOSIS — C9002 Multiple myeloma in relapse: Secondary | ICD-10-CM

## 2015-01-21 MED ORDER — POMALIDOMIDE 1 MG PO CAPS: ORAL_CAPSULE | ORAL | Status: DC

## 2015-01-27 ENCOUNTER — Other Ambulatory Visit: Payer: Self-pay | Admitting: *Deleted

## 2015-01-27 DIAGNOSIS — I10 Essential (primary) hypertension: Secondary | ICD-10-CM

## 2015-01-27 DIAGNOSIS — C9000 Multiple myeloma not having achieved remission: Secondary | ICD-10-CM

## 2015-01-27 MED ORDER — POTASSIUM CHLORIDE CRYS ER 20 MEQ PO TBCR: 20.0000 meq | EXTENDED_RELEASE_TABLET | Freq: Two times a day (BID) | ORAL | Status: DC

## 2015-01-29 ENCOUNTER — Other Ambulatory Visit: Payer: Self-pay | Admitting: *Deleted

## 2015-01-29 ENCOUNTER — Encounter: Payer: Self-pay | Admitting: Gynecologic Oncology

## 2015-01-29 ENCOUNTER — Ambulatory Visit: Payer: BC Managed Care – PPO | Attending: Gynecologic Oncology | Admitting: Gynecologic Oncology

## 2015-01-29 VITALS — BP 134/68 | HR 83 | Temp 98.3°F | Resp 18 | Ht 64.0 in | Wt 186.9 lb

## 2015-01-29 DIAGNOSIS — C7982 Secondary malignant neoplasm of genital organs: Secondary | ICD-10-CM | POA: Insufficient documentation

## 2015-01-29 DIAGNOSIS — C541 Malignant neoplasm of endometrium: Secondary | ICD-10-CM | POA: Insufficient documentation

## 2015-01-29 DIAGNOSIS — I10 Essential (primary) hypertension: Secondary | ICD-10-CM

## 2015-01-29 DIAGNOSIS — C9000 Multiple myeloma not having achieved remission: Secondary | ICD-10-CM

## 2015-01-29 MED ORDER — POTASSIUM CHLORIDE CRYS ER 20 MEQ PO TBCR: 20.0000 meq | EXTENDED_RELEASE_TABLET | Freq: Two times a day (BID) | ORAL | Status: DC

## 2015-01-29 NOTE — Patient Instructions (Signed)
We will contact you with the results of your PET scan.  Plan to meet with Dr. Sondra Come as scheduled.

## 2015-01-29 NOTE — Progress Notes (Signed)
Office Visit:  GYN ONCOLOGY   CC:   Recurrent endometrial cancer    Assessment:   64 y.o.  year old with Stage1A Grade 2 endometrioid endometrial cancer staged 10/2011 with recurrence at the distal vagina identified in April 2014.  Subsequent PET scan was negative for evidence of metastatic disease and Emily M WoodwardCompleted vaginal  brachytherapy 07/29/2012 Second vaginal recurrence (apex) diagnosed December 2016.  I discussed with Emily Greer and her husband that this appears small and localized to the vaginal apex. We will first confirm the extent of recurrence with a PET/CT.  Ideally, if this is localized to the vaginal apex, we would treat this with radiation, however she has already received vaginal brachytherapy. I will have her seen by Dr Kinard to re-evaluate for radiation to the external beam radiation +/- additional vaginal brachytherapy. If this is not an option for her, we will consider a radical surgical procedure (either radical upper colpectomy vs total pelvic exenteration) however, particularly in the case of a total pelvic exenteration, this would involve permanent urinary and fecal diversion, and significant morbidity and quality of life implications.  I will see Emily Greer back for discussion after her PET and consultation with Dr Kinard.  HPI:  Emily Greer is a 64 y.o. year old G3P2 initially seen in consultation on 10/05/2011  grade 1  endometrial cancer  She then underwent a  total abdominal hysterectomy bilateral salpingo-oophorectomy bilateral pelvic lymph node dissection on  10/31/2011 without complications.  Her postoperative course was  uncomplicated.  Her final pathologic diagnosis is a Stage  1A Grade  2 endometrioid endometrial cancer with  negative lymphovascular space invasion,  2/20 (10%) of myometrial invasion and negative lymph nodes.  On 1/14 visit she reported  post coital vaginal bleeding  that was self limiting. Vaginal biopsy - LARGELY DENUDED SQUAMOUS  EPITHELIUM WITH ASSOCIATED SPONGIOSIS AND CHRONIC INFLAMMATION.- NO DYSPLASIA OR MALIGNANCY  On the visit 04/2012 she c/o vaginal bleeding.  Biopsies were c/w metastatic disease at the distal vagina  PET 06/05/2012 IMPRESSION:  1. There are no specific features identified to suggest  hypermetabolic metastasis from endometrial carcinoma.  2. Skeletal changes of multiple myeloma with focal area of increased uptake   Treated with vaginal brachytherapy June 4, June 11, June 19, June 25, June 30/2014 Site/dose: Vagina, 30.5 Gy in 5 fractions (6 Gy, 6 Gy, 6 Gy, 6 Gy, 6.5 Gy)  Pap 08/17/2013 ASCUS HPV neg  Interval Hx: The patient was last seen by gyn onc on 10/29/13 with a normal exam. She carries the diagnosis of myeloma (recurrent lambda light chain myeloma) and underwent a second autologous stem cell transplant on 07/24/14 at [facility]. She was admitted to hospital in November 2016 with pneumonia.  She reports light vaginal spotting with micturition since September 2016. She was seen by Dr Haygood on 01/21/15 at which time a friable lesion was noted at the vaginal cuff. This was biopsied and was consistent with endometrioid adenocarcinoma (recurrence).   Past Medical History  Diagnosis Date  . Lambda light chain myeloma (HCC) 11/11/2007  . Hypertension 11/28/97  . H/O multiple myeloma 11/28/1997  . Staphylococcus aureus bacteremia 11/28/1997  . Staphylococcus epidermidis bacteremia 11/28/97  . Pregnancy induced hypertension   . Osteoporosis 12/18/05    Increased  risk   . Increased BMI 12/18/05  . Post-menopausal bleeding 12/19/06  . Vaginal atrophy 12/19/06  . Elevated hemoglobin A1c     Borderline  . Fibroid 07/26/10    asymptomatic  . Vaginal atrophy   .   PONV (postoperative nausea and vomiting)     nausea in past, none recent  . Endometrial carcinoma (HCC) 05/28/12  . History of radiation therapy 6/4, 6/11, 6/19, 6/25, 07/29/2012    vagina 30.5 gray in 5 fx, HDR brachytherapy      Past Surgical History  Procedure Laterality Date  . Tubal ligation  1986  . Ectopic pregnancy surgery  1992  . Hysteroscopy w/d&c  09/27/2011    Procedure: DILATATION AND CURETTAGE /HYSTEROSCOPY;  Surgeon: Vanessa P Haygood, MD;  Location: [facility];  Service: Gynecology;;  . Laparotomy  10/31/2011    Procedure: EXPLORATORY LAPAROTOMY;  Surgeon: Wendy Brewster, MD PHD;  Location: [facility];  Service: Gynecology;  Laterality: N/A;  . Abdominal hysterectomy  10/31/2011    Procedure: HYSTERECTOMY ABDOMINAL;  Surgeon: Wendy Brewster, MD PHD;  Location: [facility];  Service: Gynecology;  Laterality: N/A;  . Salpingoophorectomy  10/31/2011    Procedure: SALPINGO OOPHERECTOMY;  Surgeon: Wendy Brewster, MD PHD;  Location: [facility];  Service: Gynecology;  Laterality: Bilateral;   Lymph Nodes sampling   Social History: Retired from teaching profession.  Her husband is well.   Review of systems: Constitutional:  She has no fever or chills. No changes in weight.  Cardiovascular: No chest pain, palpitations or edema. Respiratory:  No shortness of breath, wheezing or cough Gastrointestinal: She has normal bowel movements without diarrhea or constipation. She denies any nausea or vomiting.   Genitourinary:  She denies pelvic pain, pelvic pressure or changes in her urinary function. + vaginal bleeding (light) Musculoskeletal:  No changes in gait or joint pain Otherwise uninformative 10 point review of system   Physical Exam: Blood pressure 134/68, pulse 83, temperature 98.3 F (36.8 C), temperature source Oral, resp. rate 18, height 5' 4" (1.626 m), weight 186 lb 14.4 oz (84.777 kg), SpO2 98 %. Wt Readings from Last 3 Encounters:  01/29/15 186 lb 14.4 oz (84.777 kg)  01/18/15 191 lb 0.6 oz (86.655 kg)  01/04/15 182 lb 0.6 oz (82.573 kg)    General: Well dressed, well nourished in no apparent distress.   Lungs:  Clear to auscultation bilaterally.  No wheezes. Cardiovascular:  Regular rate and rhythm.  No  murmurs or rubs. Abdomen:  Soft, nontender, nondistended.  No palpable masses.  No hepatosplenomegaly.  No ascites. Normal bowel sounds.  No hernias.  Incision is clean dry and intact without any evidence of masses or hernia   Genitourinary: Normal EGBUS  Vaginal cuff 5mm friable erythematous lesion/papule at mid vaginal apex. No lesions on distal vagina. No palpable mass and no extension that is appreciated on rectovaginal exam. Back: No CVA tenderness  LN:  No cervical supraclavicular or inguinal adenopathy  Extremities: No cyanosis, clubbing or edema.  No calf tenderness or erythema Musculoskeletal: No pain, normal strength and range of motion.  25 minutes of face to face counseling was spent with the patient discussing her diagnosis and prognosis, and treatment planning and options. Rossi, Emma Caroline  MD. 

## 2015-02-03 ENCOUNTER — Ambulatory Visit: Payer: BC Managed Care – PPO | Admitting: Hematology & Oncology

## 2015-02-03 ENCOUNTER — Other Ambulatory Visit: Payer: BC Managed Care – PPO

## 2015-02-03 ENCOUNTER — Inpatient Hospital Stay: Payer: BC Managed Care – PPO

## 2015-02-04 ENCOUNTER — Ambulatory Visit (HOSPITAL_BASED_OUTPATIENT_CLINIC_OR_DEPARTMENT_OTHER): Payer: BC Managed Care – PPO

## 2015-02-04 ENCOUNTER — Other Ambulatory Visit: Payer: Self-pay | Admitting: *Deleted

## 2015-02-04 ENCOUNTER — Inpatient Hospital Stay: Payer: BC Managed Care – PPO

## 2015-02-04 ENCOUNTER — Other Ambulatory Visit (HOSPITAL_BASED_OUTPATIENT_CLINIC_OR_DEPARTMENT_OTHER): Payer: BC Managed Care – PPO

## 2015-02-04 VITALS — BP 125/83 | HR 80 | Temp 98.4°F | Resp 18

## 2015-02-04 DIAGNOSIS — Z5112 Encounter for antineoplastic immunotherapy: Secondary | ICD-10-CM | POA: Diagnosis not present

## 2015-02-04 DIAGNOSIS — C9001 Multiple myeloma in remission: Secondary | ICD-10-CM | POA: Diagnosis not present

## 2015-02-04 DIAGNOSIS — Z8579 Personal history of other malignant neoplasms of lymphoid, hematopoietic and related tissues: Secondary | ICD-10-CM

## 2015-02-04 DIAGNOSIS — C9000 Multiple myeloma not having achieved remission: Secondary | ICD-10-CM

## 2015-02-04 LAB — CMP (CANCER CENTER ONLY)
ALT(SGPT): 24 U/L (ref 10–47)
AST: 20 U/L (ref 11–38)
Albumin: 4 g/dL (ref 3.3–5.5)
Alkaline Phosphatase: 43 U/L (ref 26–84)
BUN, Bld: 15 mg/dL (ref 7–22)
CO2: 25 mEq/L (ref 18–33)
Calcium: 9.4 mg/dL (ref 8.0–10.3)
Chloride: 101 mEq/L (ref 98–108)
Creat: 0.9 mg/dl (ref 0.6–1.2)
Glucose, Bld: 87 mg/dL (ref 73–118)
Potassium: 3.5 mEq/L (ref 3.3–4.7)
Sodium: 139 mEq/L (ref 128–145)
Total Bilirubin: 0.7 mg/dl (ref 0.20–1.60)
Total Protein: 7.5 g/dL (ref 6.4–8.1)

## 2015-02-04 LAB — CBC WITH DIFFERENTIAL (CANCER CENTER ONLY)
BASO#: 0.2 10*3/uL (ref 0.0–0.2)
BASO%: 2.8 % — ABNORMAL HIGH (ref 0.0–2.0)
EOS%: 1.7 % (ref 0.0–7.0)
Eosinophils Absolute: 0.1 10*3/uL (ref 0.0–0.5)
HCT: 35.4 % (ref 34.8–46.6)
HGB: 11.7 g/dL (ref 11.6–15.9)
LYMPH#: 1.7 10*3/uL (ref 0.9–3.3)
LYMPH%: 32 % (ref 14.0–48.0)
MCH: 27 pg (ref 26.0–34.0)
MCHC: 33.1 g/dL (ref 32.0–36.0)
MCV: 82 fL (ref 81–101)
MONO#: 0.7 10*3/uL (ref 0.1–0.9)
MONO%: 13.6 % — ABNORMAL HIGH (ref 0.0–13.0)
NEUT#: 2.6 10*3/uL (ref 1.5–6.5)
NEUT%: 49.9 % (ref 39.6–80.0)
Platelets: 271 10*3/uL (ref 145–400)
RBC: 4.34 10*6/uL (ref 3.70–5.32)
RDW: 17.9 % — ABNORMAL HIGH (ref 11.1–15.7)
WBC: 5.3 10*3/uL (ref 3.9–10.0)

## 2015-02-04 MED ORDER — PAMIDRONATE DISODIUM 90 MG/10ML IV SOLN
90.0000 mg | Freq: Once | INTRAVENOUS | Status: AC
  Administered 2015-02-04: 90 mg via INTRAVENOUS
  Filled 2015-02-04: qty 10

## 2015-02-04 MED ORDER — ONDANSETRON HCL 8 MG PO TABS
8.0000 mg | ORAL_TABLET | Freq: Once | ORAL | Status: AC
  Administered 2015-02-04: 8 mg via ORAL

## 2015-02-04 MED ORDER — BORTEZOMIB CHEMO SQ INJECTION 3.5 MG (2.5MG/ML)
1.2000 mg/m2 | Freq: Once | INTRAMUSCULAR | Status: AC
  Administered 2015-02-04: 2.5 mg via SUBCUTANEOUS
  Filled 2015-02-04: qty 2.5

## 2015-02-04 MED ORDER — ONDANSETRON HCL 8 MG PO TABS
ORAL_TABLET | ORAL | Status: AC
  Filled 2015-02-04: qty 1

## 2015-02-04 NOTE — Patient Instructions (Addendum)
[place] Discharge Instructions for Patients Receiving Chemotherapy  Today you received the following chemotherapy agents Velcade.  To help prevent nausea and vomiting after your treatment, we encourage you to take your nausea medication.   If you develop nausea and vomiting that is not controlled by your nausea medication, call the clinic.   BELOW ARE SYMPTOMS THAT SHOULD BE REPORTED IMMEDIATELY:  *FEVER GREATER THAN 100.5 F  *CHILLS WITH OR WITHOUT FEVER  NAUSEA AND VOMITING THAT IS NOT CONTROLLED WITH YOUR NAUSEA MEDICATION  *UNUSUAL SHORTNESS OF BREATH  *UNUSUAL BRUISING OR BLEEDING  TENDERNESS IN MOUTH AND THROAT WITH OR WITHOUT PRESENCE OF ULCERS  *URINARY PROBLEMS  *BOWEL PROBLEMS  UNUSUAL RASH Items with * indicate a potential emergency and should be followed up as soon as possible.  Feel free to call the clinic you have any questions or concerns. The clinic phone number is [phone].  Please show the [place] at check-in to the Emergency Department and triage nurse.    Pamidronate injection What is this medicine? PAMIDRONATE (pa mi DROE nate) slows calcium loss from bones. It is used to treat high calcium blood levels from cancer or Paget's disease. It is also used to treat bone pain and prevent fractures from certain cancers that have spread to the bone. This medicine may be used for other purposes; ask your health care provider or pharmacist if you have questions. What should I tell my health care provider before I take this medicine? They need to know if you have any of these conditions: -aspirin-sensitive asthma -dental disease -kidney disease -an unusual or allergic reaction to pamidronate, other medicines, foods, dyes, or preservatives -pregnant or trying to get pregnant -breast-feeding How should I use this medicine? This medicine is for infusion into a vein. It is given by a health care professional in a hospital or  clinic setting. Talk to your pediatrician regarding the use of this medicine in children. This medicine is not approved for use in children. Overdosage: If you think you have taken too much of this medicine contact a poison control center or emergency room at once. NOTE: This medicine is only for you. Do not share this medicine with others. What if I miss a dose? This does not apply. What may interact with this medicine? -certain antibiotics given by injection -medicines for inflammation or pain like ibuprofen, naproxen -some diuretics like bumetanide, furosemide -cyclosporine -parathyroid hormone -tacrolimus -teriparatide -thalidomide This list may not describe all possible interactions. Give your health care provider a list of all the medicines, herbs, non-prescription drugs, or dietary supplements you use. Also tell them if you smoke, drink alcohol, or use illegal drugs. Some items may interact with your medicine. What should I watch for while using this medicine? Visit your doctor or health care professional for regular checkups. It may be some time before you see the benefit from this medicine. Do not stop taking your medicine unless your doctor tells you to. Your doctor may order blood tests or other tests to see how you are doing. Women should inform their doctor if they wish to become pregnant or think they might be pregnant. There is a potential for serious side effects to an unborn child. Talk to your health care professional or pharmacist for more information. You should make sure that you get enough calcium and vitamin D while you are taking this medicine. Discuss the foods you eat and the vitamins you take with your health care professional. Some  people who take this medicine have severe bone, joint, and/or muscle pain. This medicine may also increase your risk for a broken thigh bone. Tell your doctor right away if you have pain in your upper leg or groin. Tell your doctor if you  have any pain that does not go away or that gets worse. What side effects may I notice from receiving this medicine? Side effects that you should report to your doctor or health care professional as soon as possible: -allergic reactions like skin rash, itching or hives, swelling of the face, lips, or tongue -black or tarry stools -changes in vision -eye inflammation, pain -high blood pressure -jaw pain, especially burning or cramping -muscle weakness -numb, tingling pain -swelling of feet or hands -trouble passing urine or change in the amount of urine -unable to move easily Side effects that usually do not require medical attention (report to your doctor or health care professional if they continue or are bothersome): -bone, joint, or muscle pain -constipation -dizzy, drowsy -fever -headache -loss of appetite -nausea, vomiting -pain at site where injected This list may not describe all possible side effects. Call your doctor for medical advice about side effects. You may report side effects to FDA at 1-800-FDA-1088. Where should I keep my medicine? This drug is given in a hospital or clinic and will not be stored at home. NOTE: This sheet is a summary. It may not cover all possible information. If you have questions about this medicine, talk to your doctor, pharmacist, or health care provider.    2016, Elsevier/Gold Standard. (2010-07-15 08:49:49)

## 2015-02-05 NOTE — Progress Notes (Signed)
GYN Location of Tumor / Histology: Recurrent endometrial cancer   Emily Greer presented with symptoms of: She reports having burning with urination and light vaginal spotting with micturition since September 2016. She was seen by Dr Leo Grosser on 01/21/15 at which time a friable lesion was noted at the vaginal cuff. This was biopsied and was consistent with endometrioid adenocarcinoma (recurrence).  Biopsies revealed:   01/21/15 Diagnosis Vagina, biopsy - ADENOCARCINOMA, COMPATIBLE WITH ENDOMETRIAL ADENOCARCINOMA. - SEE COMMENT.  Past/Anticipated interventions by Gyn/Onc surgery, if any: Per Dr. Denman George, if radiation is not an option, "we will consider a radical surgical procedure (either radical upper colpectomy vs total pelvic exenteration). "  Past/Anticipated interventions by medical oncology, if any: Recurrent lambda light chain myeloma status post second autologous stem cell transplant on 07/24/2014.  Maintenance therapy with Pomalidomide/every 2 week Velcade. Aredia 60 mg IV Q. 3 month.  Weight changes, if any: no  Bowel/Bladder complaints, if any: has occasional dysuria, denies having hematuria now.  She denies having any bowel issues.  Nausea/Vomiting, if any: no  Pain issues, if any:  no  SAFETY ISSUES:  Prior radiation? 6/4, 6/11, 6/19, 6/25, 07/29/2012 -vagina 30.5 gray in 5 fx, HDR brachytherapy   Pacemaker/ICD? no  Possible current pregnancy? no  Is the patient on methotrexate? no  Current Complaints / other details:  Patient is here with her husband.  She will have a CT body on Monday.  Reports she had a pap smear on 12/22 that was normal.  BP 143/84 mmHg  Pulse 91  Temp(Src) 98.5 F (36.9 C) (Oral)  Resp 16  Ht 5\' 4"  (1.626 m)  Wt 189 lb 11.2 oz (86.047 kg)  BMI 32.55 kg/m2

## 2015-02-09 ENCOUNTER — Telehealth: Payer: Self-pay | Admitting: Gynecologic Oncology

## 2015-02-09 ENCOUNTER — Other Ambulatory Visit: Payer: Self-pay | Admitting: Gynecologic Oncology

## 2015-02-09 DIAGNOSIS — C541 Malignant neoplasm of endometrium: Secondary | ICD-10-CM

## 2015-02-09 NOTE — Progress Notes (Signed)
CT since PET denied

## 2015-02-09 NOTE — Telephone Encounter (Signed)
Spoke with the patient about PET being denied.  Informed that CT orders have been placed and she would be contacted with a date and time for that.  Advised to keep her appt with Dr. Sondra Come.

## 2015-02-10 ENCOUNTER — Other Ambulatory Visit: Payer: Self-pay | Admitting: Hematology & Oncology

## 2015-02-10 ENCOUNTER — Ambulatory Visit (HOSPITAL_COMMUNITY): Payer: BC Managed Care – PPO

## 2015-02-10 ENCOUNTER — Institutional Professional Consult (permissible substitution): Payer: Self-pay | Admitting: Radiation Oncology

## 2015-02-11 ENCOUNTER — Encounter: Payer: Self-pay | Admitting: Radiation Oncology

## 2015-02-11 ENCOUNTER — Other Ambulatory Visit: Payer: Self-pay | Admitting: *Deleted

## 2015-02-11 ENCOUNTER — Ambulatory Visit
Admission: RE | Admit: 2015-02-11 | Discharge: 2015-02-11 | Disposition: A | Discharge status: Home / Self Care | Payer: BC Managed Care – PPO | Source: Ambulatory Visit | Attending: Radiation Oncology | Admitting: Radiation Oncology

## 2015-02-11 VITALS — BP 143/84 | HR 91 | Temp 98.5°F | Resp 16 | Ht 64.0 in | Wt 189.7 lb

## 2015-02-11 DIAGNOSIS — Z510 Encounter for antineoplastic radiation therapy: Secondary | ICD-10-CM | POA: Insufficient documentation

## 2015-02-11 DIAGNOSIS — C541 Malignant neoplasm of endometrium: Secondary | ICD-10-CM

## 2015-02-11 DIAGNOSIS — C7982 Secondary malignant neoplasm of genital organs: Secondary | ICD-10-CM

## 2015-02-11 MED ORDER — ACYCLOVIR 400 MG PO TABS: 400.0000 mg | ORAL_TABLET | Freq: Two times a day (BID) | ORAL | Status: DC

## 2015-02-11 NOTE — Progress Notes (Signed)
Radiation Oncology         [phone] ________________________________  Name: Emily Greer MRN: [id number]  Date: 02/11/2015  DOB: December 10, 1950  Re-Consultation Note  CC: Foye Spurling, MD  [name], MD   Diagnosis:  Recurrent endometrial cancer  Interval Since Last Radiation:  2 years and 6  months in which the patient completed intracavitary brachytherapy treatments for recurrence. (June 4, June 11, June 19, June 25, July 29, 2012)  Site/dose: Vagina,  30.5 Gy in 5 fractions (6 Gy, 6 Gy, 6 Gy, 6 Gy, 6.5 Gy)  Narrative:  The patient returns today for a re-consultation with radiation oncology. She reports having burning with urination and light vaginal spotting with micturition since September 2016. She was seen by Dr. Leo Grosser on 01/21/15 at which time a friable lesion was noted at the vaginal cuff. This was biopsied and was consistent with endometrial adenocarcinoma (recurrence). She reports having a Pap smear on 01/21/15 that was normal. She was seen by Dr. Denman George on 01/29/15. She carries the diagnosis of myeloma (recurrent lambda light chain myeloma) and underwent a second autologous stem cell transplant on 07/24/14 at [facility]. She was admitted to the hospital in November 2016 with pneumonia. The patient presents to the clinic with her husband.  ALLERGIES:  is allergic to codeine.  Meds: Current Outpatient Prescriptions  Medication Sig Dispense Refill  . acyclovir (ZOVIRAX) 400 MG tablet TAKE 1 TABLET BY MOUTH TWICE DAILY 60 tablet 0  . acyclovir (ZOVIRAX) 400 MG tablet Take 1 tablet (400 mg total) by mouth 2 (two) times daily. 60 tablet 2  . albuterol (PROVENTIL HFA;VENTOLIN HFA) 108 (90 BASE) MCG/ACT inhaler Inhale 2 puffs into the lungs every 6 (six) hours as needed for wheezing or shortness of breath. 2 puffs 3 times daily x 5 days then every 6 hours as needed. 1 Inhaler 0  . amLODipine (NORVASC) 10 MG tablet TAKE 1 TABLET BY MOUTH DAILY    . bortezomib IV (VELCADE) 3.5  MG injection Inject 1.3 mg/m2 into the vein once. Patient is taking 2.5 MG injection every other week    . Cholecalciferol (VITAMIN D3) 2000 UNITS TABS Take 1 tablet by mouth daily.    [name] Kitchen loratadine (CLARITIN) 10 MG tablet Take 10 mg by mouth every morning.     . ondansetron (ZOFRAN) 8 MG tablet Take 8 mg by mouth every 8 (eight) hours as needed for nausea or vomiting.    [name] Kitchen oxymetazoline (AFRIN) 0.05 % nasal spray Place 1 spray into both nostrils 2 (two) times daily. Reported on 01/29/2015    . pomalidomide (POMALYST) 1 MG capsule Take with water on days 1-21. Repeat every 28 days. SNKN#[id number] 21 capsule 0  . potassium chloride SA (K-DUR,KLOR-CON) 20 MEQ tablet Take 1 tablet (20 mEq total) by mouth 2 (two) times daily. 60 tablet 3  . triamterene-hydrochlorothiazide (MAXZIDE-25) 37.5-25 MG per tablet Take 1 tablet by mouth daily. 30 tablet 6  . benzonatate (TESSALON) 200 MG capsule Take 200 mg by mouth 3 (three) times daily as needed for cough. Reported on 02/11/2015    . guaiFENesin (MUCINEX) 600 MG 12 hr tablet Take 2 tablets (1,200 mg total) by mouth 2 (two) times daily. Take for 5 days then stop. (Patient not taking: Reported on 01/29/2015) 20 tablet 0   No current facility-administered medications for this encounter.    Physical Findings: The patient is in no acute distress.   height is '5\' 4"'$  (1.626 m) and weight is 189 lb 11.2 oz (  86.047 kg). Her oral temperature is 98.5 F (36.9 C). Her blood pressure is 143/84 and her pulse is 91. Her respiration is 16.  The patient was not examined today.  Lab Findings: Lab Results  Component Value Date   WBC 5.3 02/04/2015   HGB 11.7 02/04/2015   HCT 35.4 02/04/2015   MCV 82 02/04/2015   PLT 271 02/04/2015    Radiographic Findings: Dg Chest 2 View  01/13/2015  CLINICAL DATA:  Pneumonia. EXAM: CHEST  2 VIEW COMPARISON:  12/28/2014. FINDINGS: The heart size and mediastinal contours are within normal limits. Interval improvement in aeration to  the left lower lobe. No new findings. The visualized skeletal structures are unremarkable. IMPRESSION: Improving aeration to the left lower lobe. Electronically Signed   By: Kerby Moors M.D.   On: 01/13/2015 15:36    Impression: Emily Greer is a 65 year old woman presenting to clinic in regards to her recurrent endometrial cancer. She now has a further recurrence at the vaginal cuff. With her previous treatment the patient had two areas of recurrence with one at the distal vaginal area and one at the vaginal cuff, therefore the entire vaginal vault was treated at that time. External radiation is an option but  intracavitary brachytherapy is not a good option for the patient since the entire vaginal vault was treated with her previous brachytherapy. Patient's insurance would not approve a PET scan for further evaluation therefore the patient is scheduled for a body CT scan early next week  Plan: She has a CT scan scheduled on 02/15/15 and she will see Dr. Marin Olp the same day. The patient was advised to attend her follow-up appointment later this month with [facility] in regards to her bone marrow transplant for myeloma. A bone marrow biopsy would be conducted. She is also scheduled to follow up with Dr. Skeet Latch on 03/04/15. Her case would be discussed at the multidisciplinary gynecologic oncology conference.  I spoke to the patient today regarding her diagnosis and options for treatment. We discussed the process of CT simulation and the placement tattoos. We discussed approximately 5 weeks of external beam radiation treatment as an outpatient pending her CT scan results and discussion at the multidisciplinary conference. We discussed the low likelihood of secondary malignancies. We discussed the possible side effects including but not limited to skin redness, fatigue, permanent skin darkening, nausea, diarrhea, and/or rectal bleeding. We also discussed that her treatment will be somewhat complicated  in the fact that she has history of multiple myeloma as well as recurrent endometrial cancer. -----------------------------------  Blair Promise, PhD, MD  This document serves as a record of services personally performed by Gery Pray, MD. It was created on his behalf by Darcus Austin, a trained medical scribe. The creation of this record is based on the scribe's personal observations and the provider's statements to them. This document has been checked and approved by the attending provider.

## 2015-02-11 NOTE — Progress Notes (Signed)
Please see the Nurse Progress Note in the MD Initial Consult Encounter for this patient. 

## 2015-02-12 ENCOUNTER — Ambulatory Visit (HOSPITAL_COMMUNITY): Payer: BC Managed Care – PPO

## 2015-02-12 NOTE — Addendum Note (Signed)
Encounter addended by: Jacqulyn Liner, RN on: 02/12/2015  8:09 AM<BR>     Documentation filed: Charges VN

## 2015-02-15 ENCOUNTER — Ambulatory Visit (HOSPITAL_BASED_OUTPATIENT_CLINIC_OR_DEPARTMENT_OTHER): Payer: BC Managed Care – PPO

## 2015-02-15 ENCOUNTER — Encounter: Payer: Self-pay | Admitting: Family

## 2015-02-15 ENCOUNTER — Other Ambulatory Visit (HOSPITAL_BASED_OUTPATIENT_CLINIC_OR_DEPARTMENT_OTHER): Payer: BC Managed Care – PPO

## 2015-02-15 ENCOUNTER — Encounter (HOSPITAL_COMMUNITY): Payer: Self-pay

## 2015-02-15 ENCOUNTER — Ambulatory Visit (HOSPITAL_COMMUNITY)
Admission: RE | Admit: 2015-02-15 | Discharge: 2015-02-15 | Disposition: A | Discharge status: Home / Self Care | Payer: BC Managed Care – PPO | Source: Ambulatory Visit | Attending: Gynecologic Oncology | Admitting: Gynecologic Oncology

## 2015-02-15 ENCOUNTER — Ambulatory Visit (HOSPITAL_BASED_OUTPATIENT_CLINIC_OR_DEPARTMENT_OTHER): Payer: BC Managed Care – PPO | Admitting: Family

## 2015-02-15 VITALS — BP 143/81 | HR 85 | Temp 98.1°F | Resp 16 | Ht 64.0 in | Wt 190.0 lb

## 2015-02-15 DIAGNOSIS — C9000 Multiple myeloma not having achieved remission: Secondary | ICD-10-CM

## 2015-02-15 DIAGNOSIS — Z923 Personal history of irradiation: Secondary | ICD-10-CM | POA: Diagnosis not present

## 2015-02-15 DIAGNOSIS — C9002 Multiple myeloma in relapse: Secondary | ICD-10-CM

## 2015-02-15 DIAGNOSIS — Z9484 Stem cells transplant status: Secondary | ICD-10-CM

## 2015-02-15 DIAGNOSIS — C541 Malignant neoplasm of endometrium: Secondary | ICD-10-CM | POA: Insufficient documentation

## 2015-02-15 DIAGNOSIS — Z5112 Encounter for antineoplastic immunotherapy: Secondary | ICD-10-CM

## 2015-02-15 DIAGNOSIS — C9001 Multiple myeloma in remission: Secondary | ICD-10-CM

## 2015-02-15 LAB — CBC WITH DIFFERENTIAL (CANCER CENTER ONLY)
BASO#: 0.1 10*3/uL (ref 0.0–0.2)
BASO%: 1.7 % (ref 0.0–2.0)
EOS%: 2.9 % (ref 0.0–7.0)
Eosinophils Absolute: 0.2 10*3/uL (ref 0.0–0.5)
HCT: 33.2 % — ABNORMAL LOW (ref 34.8–46.6)
HGB: 11 g/dL — ABNORMAL LOW (ref 11.6–15.9)
LYMPH#: 1.4 10*3/uL (ref 0.9–3.3)
LYMPH%: 26.7 % (ref 14.0–48.0)
MCH: 27.2 pg (ref 26.0–34.0)
MCHC: 33.1 g/dL (ref 32.0–36.0)
MCV: 82 fL (ref 81–101)
MONO#: 0.7 10*3/uL (ref 0.1–0.9)
MONO%: 13.4 % — ABNORMAL HIGH (ref 0.0–13.0)
NEUT#: 2.9 10*3/uL (ref 1.5–6.5)
NEUT%: 55.3 % (ref 39.6–80.0)
Platelets: 247 10*3/uL (ref 145–400)
RBC: 4.05 10*6/uL (ref 3.70–5.32)
RDW: 17.5 % — ABNORMAL HIGH (ref 11.1–15.7)
WBC: 5.2 10*3/uL (ref 3.9–10.0)

## 2015-02-15 LAB — CMP (CANCER CENTER ONLY)
ALT(SGPT): 19 U/L (ref 10–47)
AST: 21 U/L (ref 11–38)
Albumin: 3.8 g/dL (ref 3.3–5.5)
Alkaline Phosphatase: 45 U/L (ref 26–84)
BUN, Bld: 12 mg/dL (ref 7–22)
CO2: 26 mEq/L (ref 18–33)
Calcium: 9.6 mg/dL (ref 8.0–10.3)
Chloride: 103 mEq/L (ref 98–108)
Creat: 1.2 mg/dl (ref 0.6–1.2)
Glucose, Bld: 92 mg/dL (ref 73–118)
Potassium: 3.2 mEq/L — ABNORMAL LOW (ref 3.3–4.7)
Sodium: 137 mEq/L (ref 128–145)
Total Bilirubin: 0.6 mg/dl (ref 0.20–1.60)
Total Protein: 7.1 g/dL (ref 6.4–8.1)

## 2015-02-15 LAB — LACTATE DEHYDROGENASE: LDH: 175 U/L (ref 125–245)

## 2015-02-15 MED ORDER — ONDANSETRON HCL 8 MG PO TABS
ORAL_TABLET | ORAL | Status: AC
  Filled 2015-02-15: qty 1

## 2015-02-15 MED ORDER — BORTEZOMIB CHEMO SQ INJECTION 3.5 MG (2.5MG/ML)
1.2000 mg/m2 | Freq: Once | INTRAMUSCULAR | Status: AC
  Administered 2015-02-15: 2.5 mg via SUBCUTANEOUS
  Filled 2015-02-15: qty 2.5

## 2015-02-15 MED ORDER — IOHEXOL 300 MG/ML  SOLN
100.0000 mL | Freq: Once | INTRAMUSCULAR | Status: AC | PRN
  Administered 2015-02-15: 100 mL via INTRAVENOUS

## 2015-02-15 MED ORDER — ONDANSETRON HCL 8 MG PO TABS
8.0000 mg | ORAL_TABLET | Freq: Once | ORAL | Status: AC
  Administered 2015-02-15: 8 mg via ORAL

## 2015-02-15 NOTE — Patient Instructions (Signed)
[place] Discharge Instructions for Patients Receiving Chemotherapy  Today you received the following chemotherapy agents Velcade.  To help prevent nausea and vomiting after your treatment, we encourage you to take your nausea medication.   If you develop nausea and vomiting that is not controlled by your nausea medication, call the clinic.   BELOW ARE SYMPTOMS THAT SHOULD BE REPORTED IMMEDIATELY:  *FEVER GREATER THAN 100.5 F  *CHILLS WITH OR WITHOUT FEVER  NAUSEA AND VOMITING THAT IS NOT CONTROLLED WITH YOUR NAUSEA MEDICATION  *UNUSUAL SHORTNESS OF BREATH  *UNUSUAL BRUISING OR BLEEDING  TENDERNESS IN MOUTH AND THROAT WITH OR WITHOUT PRESENCE OF ULCERS  *URINARY PROBLEMS  *BOWEL PROBLEMS  UNUSUAL RASH Items with * indicate a potential emergency and should be followed up as soon as possible.  Feel free to call the clinic you have any questions or concerns. The clinic phone number is [phone].  Please show the [place] at check-in to the Emergency Department and triage nurse.    Pamidronate injection What is this medicine? PAMIDRONATE (pa mi DROE nate) slows calcium loss from bones. It is used to treat high calcium blood levels from cancer or Paget's disease. It is also used to treat bone pain and prevent fractures from certain cancers that have spread to the bone. This medicine may be used for other purposes; ask your health care provider or pharmacist if you have questions. What should I tell my health care provider before I take this medicine? They need to know if you have any of these conditions: -aspirin-sensitive asthma -dental disease -kidney disease -an unusual or allergic reaction to pamidronate, other medicines, foods, dyes, or preservatives -pregnant or trying to get pregnant -breast-feeding How should I use this medicine? This medicine is for infusion into a vein. It is given by a health care professional in a hospital or  clinic setting. Talk to your pediatrician regarding the use of this medicine in children. This medicine is not approved for use in children. Overdosage: If you think you have taken too much of this medicine contact a poison control center or emergency room at once. NOTE: This medicine is only for you. Do not share this medicine with others. What if I miss a dose? This does not apply. What may interact with this medicine? -certain antibiotics given by injection -medicines for inflammation or pain like ibuprofen, naproxen -some diuretics like bumetanide, furosemide -cyclosporine -parathyroid hormone -tacrolimus -teriparatide -thalidomide This list may not describe all possible interactions. Give your health care provider a list of all the medicines, herbs, non-prescription drugs, or dietary supplements you use. Also tell them if you smoke, drink alcohol, or use illegal drugs. Some items may interact with your medicine. What should I watch for while using this medicine? Visit your doctor or health care professional for regular checkups. It may be some time before you see the benefit from this medicine. Do not stop taking your medicine unless your doctor tells you to. Your doctor may order blood tests or other tests to see how you are doing. Women should inform their doctor if they wish to become pregnant or think they might be pregnant. There is a potential for serious side effects to an unborn child. Talk to your health care professional or pharmacist for more information. You should make sure that you get enough calcium and vitamin D while you are taking this medicine. Discuss the foods you eat and the vitamins you take with your health care professional. Some  people who take this medicine have severe bone, joint, and/or muscle pain. This medicine may also increase your risk for a broken thigh bone. Tell your doctor right away if you have pain in your upper leg or groin. Tell your doctor if you  have any pain that does not go away or that gets worse. What side effects may I notice from receiving this medicine? Side effects that you should report to your doctor or health care professional as soon as possible: -allergic reactions like skin rash, itching or hives, swelling of the face, lips, or tongue -black or tarry stools -changes in vision -eye inflammation, pain -high blood pressure -jaw pain, especially burning or cramping -muscle weakness -numb, tingling pain -swelling of feet or hands -trouble passing urine or change in the amount of urine -unable to move easily Side effects that usually do not require medical attention (report to your doctor or health care professional if they continue or are bothersome): -bone, joint, or muscle pain -constipation -dizzy, drowsy -fever -headache -loss of appetite -nausea, vomiting -pain at site where injected This list may not describe all possible side effects. Call your doctor for medical advice about side effects. You may report side effects to FDA at 1-800-FDA-1088. Where should I keep my medicine? This drug is given in a hospital or clinic and will not be stored at home. NOTE: This sheet is a summary. It may not cover all possible information. If you have questions about this medicine, talk to your doctor, pharmacist, or health care provider.    2016, Elsevier/Gold Standard. (2010-07-15 08:49:49)

## 2015-02-16 ENCOUNTER — Telehealth: Payer: Self-pay | Admitting: Gynecologic Oncology

## 2015-02-16 LAB — IGG, IGA, IGM
IgA, Qn, Serum: 108 mg/dL (ref 87–352)
IgG, Qn, Serum: 846 mg/dL (ref 700–1600)
IgM, Qn, Serum: 44 mg/dL (ref 26–217)

## 2015-02-16 LAB — PROTEIN ELECTROPHORESIS, SERUM, WITH REFLEX
A/G Ratio: 1.3 (ref 0.7–1.7)
Albumin: 3.9 g/dL (ref 2.9–4.4)
Alpha 1: 0.2 g/dL (ref 0.0–0.4)
Alpha 2: 0.7 g/dL (ref 0.4–1.0)
Beta: 1.1 g/dL (ref 0.7–1.3)
Gamma Globulin: 0.8 g/dL (ref 0.4–1.8)
Globulin, Total: 2.9 g/dL (ref 2.2–3.9)
Total Protein: 6.8 g/dL (ref 6.0–8.5)

## 2015-02-16 LAB — KAPPA/LAMBDA LIGHT CHAINS
Ig Kappa Free Light Chain: 13.74 mg/L (ref 3.30–19.40)
Ig Lambda Free Light Chain: 14.35 mg/L (ref 5.71–26.30)
Kappa/Lambda FluidC Ratio: 0.96 (ref 0.26–1.65)

## 2015-02-16 NOTE — Progress Notes (Signed)
Hematology and Oncology Follow Up Visit  Emily Greer [id number] Jan 29, 1951 65 y.o. 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 3 month    Interim History:  Emily Greer is here today with her husband for a follow-up. She was seen at [facility] last week and had a bone marrow biopsy. The pathology report is pending. Her lamba light chain level at that time was 1.3. Her IgG level was up to 887 after receiving IVIG in the hospital in December.  Unfortunately, her endometrial carcinoma has recurred at the vaginal apex. Biopsy on 12/22 confirmed this. Her CT scans today showed no evidence of metastatic disease. She will follow-up with Gynecologic Oncology and Dr. Sondra Come to discuss her treatment plan.  She has had some burning on urination and if she strains to have a BM. She has had some spotting of blood on her toilet tissue at times.  No fever, chills, n/v, cough, rash, dizziness, SOB, chest pain, palpitations, abdominal pain or changes in bowel habits.  No lymphadenopathy found on exam. No bruising or petechiae.  No swelling, tenderness, numbness or tingling in her extremities.  She has maintained a good appetite and is staying well hydrated. Her weight is unchanged.   Medications:    Medication List       This list is accurate as of: 02/15/15 11:59 PM.  Always use your most recent med list.               acyclovir 400 MG tablet  Commonly known as:  ZOVIRAX  TAKE 1 TABLET BY MOUTH TWICE DAILY     acyclovir 400 MG tablet  Commonly known as:  ZOVIRAX  Take 1 tablet (400 mg total) by mouth 2 (two) times daily.     albuterol 108 (90 Base) MCG/ACT inhaler  Commonly known as:  PROVENTIL HFA;VENTOLIN HFA  Inhale 2 puffs into the lungs every 6  (six) hours as needed for wheezing or shortness of breath. 2 puffs 3 times daily x 5 days then every 6 hours as needed.     amLODipine 10 MG tablet  Commonly known as:  NORVASC  TAKE 1 TABLET BY MOUTH DAILY     benzonatate 200 MG capsule  Commonly known as:  TESSALON  Take 200 mg by mouth 3 (three) times daily as needed for cough. Reported on 02/11/2015     bortezomib IV 3.5 MG injection  Commonly known as:  VELCADE  Inject 1.3 mg/m2 into the vein once. Patient is taking 2.5 MG injection every other week     guaiFENesin 600 MG 12 hr tablet  Commonly known as:  MUCINEX  Take 2 tablets (1,200 mg total) by mouth 2 (two) times daily. Take for 5 days then stop.     loratadine 10 MG tablet  Commonly known as:  CLARITIN  Take 10 mg by mouth every morning.     ondansetron 8 MG tablet  Commonly known as:  ZOFRAN  Take 8 mg by mouth every 8 (eight) hours as needed for nausea or vomiting.     oxymetazoline 0.05 % nasal spray  Commonly known as:  AFRIN  Place 1 spray into both nostrils 2 (two) times daily. Reported on 01/29/2015     pomalidomide 1 MG capsule  Commonly known as:  POMALYST  Take with water on days 1-21. Repeat every 28 days. [id number]     potassium chloride SA 20 MEQ tablet  Commonly known as:  K-DUR,KLOR-CON  Take 1 tablet (20 mEq total) by mouth 2 (two) times daily.     triamterene-hydrochlorothiazide 37.5-25 MG tablet  Commonly known as:  MAXZIDE-25  Take 1 tablet by mouth daily.     Vitamin D3 2000 units Tabs  Take 1 tablet by mouth daily.        Allergies:  Allergies  Allergen Reactions  . Codeine Nausea Only    Past Medical History, Surgical history, Social history, and Family History were reviewed and updated.  Review of Systems: All other 10 point review of systems is negative.   Physical Exam:  height is '5\' 4"'$  (1.626 m) and weight is 190 lb (86.183 kg). Her oral temperature is 98.1 F (36.7 C). Her blood pressure is 143/81 and her pulse is  85. Her respiration is 16.   Wt Readings from Last 3 Encounters:  02/15/15 190 lb (86.183 kg)  02/11/15 189 lb 11.2 oz (86.047 kg)  01/29/15 186 lb 14.4 oz (84.777 kg)    Ocular: Sclerae unicteric, pupils equal, round and reactive to light Ear-nose-throat: Oropharynx clear, dentition fair Lymphatic: No cervical supraclavicular or axillary adenopathy Lungs no rales or rhonchi, good excursion bilaterally Heart regular rate and rhythm, no murmur appreciated Abd soft, nontender, positive bowel sounds, no liver or spleen tip palpated on exam.  MSK no focal spinal tenderness, no joint edema Neuro: non-focal, well-oriented, appropriate affect Breasts: Deferred  Lab Results  Component Value Date   WBC 5.2 02/15/2015   HGB 11.0* 02/15/2015   HCT 33.2* 02/15/2015   MCV 82 02/15/2015   PLT 247 02/15/2015   Lab Results  Component Value Date   FERRITIN 227 12/31/2014   IRON 13* 12/31/2014   TIBC 302 12/31/2014   UIBC 289 12/31/2014   IRONPCTSAT 4* 12/31/2014   Lab Results  Component Value Date   RETICCTPCT 0.6 12/31/2014   RBC 4.05 02/15/2015   Lab Results  Component Value Date   KPAFRELGTCHN 1.67 11/23/2014   LAMBDASER 1.85 11/23/2014   KAPLAMBRATIO 0.90 11/23/2014   Lab Results  Component Value Date   IGGSERUM 596* 12/31/2014   IGA 91 12/31/2014   IGMSERUM 48 12/31/2014   Lab Results  Component Value Date   TOTALPROTELP 6.4 11/23/2014   ALBUMINELP 4.0 11/23/2014   A1GS 0.3 11/23/2014   A2GS 0.7 11/23/2014   BETS 0.5 11/23/2014   BETA2SER 0.4 11/23/2014   GAMS 0.7* 11/23/2014   MSPIKE NOT DET 03/25/2014   SPEI * 11/23/2014     Chemistry      Component Value Date/Time   NA 137 02/15/2015 1344   NA 139 01/18/2015 1026   NA 139 01/01/2015 0449   K 3.2* 02/15/2015 1344   K 3.7 01/18/2015 1026   K 3.8 01/01/2015 0449   CL 103 02/15/2015 1344   CL 106 01/01/2015 0449   CO2 26 02/15/2015 1344   CO2 24 01/18/2015 1026   CO2 26 01/01/2015 0449   BUN 12  02/15/2015 1344   BUN 18.7 01/18/2015 1026   BUN 15 01/01/2015 0449   CREATININE 1.2 02/15/2015 1344   CREATININE 1.0 01/18/2015 1026   CREATININE 0.99 01/01/2015 0449      Component Value Date/Time   CALCIUM 9.6 02/15/2015 1344   CALCIUM 9.5 01/18/2015 1026   CALCIUM 8.9 01/01/2015 0449   ALKPHOS 45 02/15/2015 1344  ALKPHOS 47 01/18/2015 1026   ALKPHOS 37* 12/29/2014 0011   AST 21 02/15/2015 1344   AST 15 01/18/2015 1026   AST 26 12/29/2014 0011   ALT 19 02/15/2015 1344   ALT 20 01/18/2015 1026   ALT 21 12/29/2014 0011   BILITOT 0.60 02/15/2015 1344   BILITOT 0.49 01/18/2015 1026   BILITOT 0.5 12/29/2014 0011      Impression and Plan: Emily Greer is 65 year old female with recurrent lambda light chain myeloma. Her endometrial carcinoma has recurred again at the vaginal apex.  Her CT scan today showed no evidence of metastatic disease.  We will proceed with her Velcade infusion today as planned and continue her every 2 weeks. She received Aredia on 02/04/15.  She will be following up gynecologic oncology in the next week or so to discuss her treatment plan.  She follows up with [facility] regarding her bone marrow biopsy later this month. Her report is still pending at this time.  We will plan to see her back in 1 month for labs and follow-up.  She knows to call here with any questions or concerns. We can certainly see sooner if need be.   Eliezer Bottom, NP 1/17/201710:13 AM

## 2015-02-16 NOTE — Telephone Encounter (Signed)
Patient informed of CT scan results.  Advised to call for any questions or concerns.  Patient stating she is going out of town and should be back in town on Friday.

## 2015-02-22 ENCOUNTER — Encounter: Payer: Self-pay | Admitting: Gynecologic Oncology

## 2015-02-22 NOTE — Progress Notes (Signed)
Gynecologic Oncology Multi-Disciplinary Disposition Conference Note  Date of the Conference: February 22, 2015  Patient Name: Emily Greer  Referring Provider: Dr. Kendall Flack Primary GYN Oncologist: Dr. Everitt Amber, Dr. [name]  Stage/Disposition:  Recurrent Stage IA Gr2 endometrioid endometrial adenocarcinoma.  Recommend pelvic RT.  Recommend immuno-stains of tumor to determine likely hood of lynch syndrome and genetic referral.  Consider Megace or Femara post-radiation.     This Multidisciplinary conference took place involving physicians from [place], [place], Radiation Oncology, Pathology, Radiology along with the Gynecologic Oncology Nurse Practitioner and RN.  Comprehensive assessment of the patient's malignancy, staging, need for surgery, chemotherapy, radiation therapy, and need for further testing were reviewed. Supportive measures, both inpatient and following discharge were also discussed. The recommended plan of care is documented. Greater than 35 minutes were spent correlating and coordinating this patient's care.

## 2015-02-26 ENCOUNTER — Telehealth: Payer: Self-pay | Admitting: Gynecologic Oncology

## 2015-02-26 NOTE — Telephone Encounter (Signed)
Called and informed the patient of her IHC testing results.  Informed that a referral to genetics is recommended per Dr. Denman George.  Patient stating she will think about it and discuss it further when she sees Dr. Skeet Latch on 2/2.  Advised to call for any needs or concerns.

## 2015-03-01 NOTE — Progress Notes (Addendum)
GYN Location of Tumor / Histology: Recurrent endometrial cancer   Emily Greer presented with symptoms of: She reports having burning with urination and light vaginal spotting with micturition since September 2016. She was seen by Dr Leo Grosser on 01/21/15 at which time a friable lesion was noted at the vaginal cuff. This was biopsied and was consistent with endometrioid adenocarcinoma (recurrence).  Biopsies revealed:   01/21/15 Diagnosis Vagina, biopsy - ADENOCARCINOMA, COMPATIBLE WITH ENDOMETRIAL ADENOCARCINOMA. - SEE COMMENT.  Past/Anticipated interventions by Gyn/Onc surgery, if any: Per Dr. Denman George, if radiation is not an option, "we will consider a radical surgical procedure (either radical upper colpectomy vs total pelvic exenteration). "  Past/Anticipated interventions by medical oncology, if any: Recurrent lambda light chain myeloma status post second autologous stem cell transplant on 07/24/2014. Maintenance therapy with Pomalidomide/every 2 week Velcade. Aredia 60 mg IV Q. 3 month. Consider Megace or Femara post-radiation  Weight changes, if any: no  Bowel/Bladder complaints, if any:   Nausea/Vomiting, if any: no  Pain issues, if any: no  SAFETY ISSUES:  Prior radiation? 6/4, 6/11, 6/19, 6/25, 07/29/2012 -vagina 30.5 gray in 5 fx, HDR brachytherapy  Pacemaker/ICD? no  Possible current pregnancy? no  Is the patient on methotrexate? no  Current Complaints / other details: Patient is here with her husband.  BP 142/76 mmHg  Pulse 97  Temp(Src) 98.3 F (36.8 C) (Oral)  Wt 190 lb 14.4 oz (86.592 kg)  SpO2 99%

## 2015-03-02 ENCOUNTER — Other Ambulatory Visit: Payer: Self-pay

## 2015-03-02 DIAGNOSIS — C9002 Multiple myeloma in relapse: Secondary | ICD-10-CM

## 2015-03-02 MED ORDER — POMALIDOMIDE 1 MG PO CAPS: ORAL_CAPSULE | ORAL | Status: DC

## 2015-03-03 ENCOUNTER — Other Ambulatory Visit: Payer: Self-pay | Admitting: *Deleted

## 2015-03-03 ENCOUNTER — Ambulatory Visit
Admission: RE | Admit: 2015-03-03 | Discharge: 2015-03-03 | Disposition: A | Discharge status: Home / Self Care | Payer: BC Managed Care – PPO | Source: Ambulatory Visit | Attending: Radiation Oncology | Admitting: Radiation Oncology

## 2015-03-03 ENCOUNTER — Ambulatory Visit (HOSPITAL_BASED_OUTPATIENT_CLINIC_OR_DEPARTMENT_OTHER): Payer: BC Managed Care – PPO

## 2015-03-03 ENCOUNTER — Other Ambulatory Visit (HOSPITAL_BASED_OUTPATIENT_CLINIC_OR_DEPARTMENT_OTHER): Payer: BC Managed Care – PPO

## 2015-03-03 ENCOUNTER — Encounter: Payer: Self-pay | Admitting: Radiation Oncology

## 2015-03-03 VITALS — BP 142/76 | HR 97 | Temp 98.3°F | Wt 190.9 lb

## 2015-03-03 VITALS — BP 131/76 | HR 76 | Temp 98.1°F | Resp 20

## 2015-03-03 DIAGNOSIS — C9000 Multiple myeloma not having achieved remission: Secondary | ICD-10-CM

## 2015-03-03 DIAGNOSIS — C541 Malignant neoplasm of endometrium: Secondary | ICD-10-CM

## 2015-03-03 DIAGNOSIS — C9002 Multiple myeloma in relapse: Secondary | ICD-10-CM

## 2015-03-03 DIAGNOSIS — Z5112 Encounter for antineoplastic immunotherapy: Secondary | ICD-10-CM

## 2015-03-03 DIAGNOSIS — Z510 Encounter for antineoplastic radiation therapy: Secondary | ICD-10-CM | POA: Diagnosis not present

## 2015-03-03 DIAGNOSIS — C7982 Secondary malignant neoplasm of genital organs: Principal | ICD-10-CM

## 2015-03-03 LAB — COMPREHENSIVE METABOLIC PANEL
ALT: 19 U/L (ref 0–55)
AST: 14 U/L (ref 5–34)
Albumin: 4.2 g/dL (ref 3.5–5.0)
Alkaline Phosphatase: 45 U/L (ref 40–150)
Anion Gap: 11 mEq/L (ref 3–11)
BUN: 20.5 mg/dL (ref 7.0–26.0)
CO2: 23 mEq/L (ref 22–29)
Calcium: 9.9 mg/dL (ref 8.4–10.4)
Chloride: 105 mEq/L (ref 98–109)
Creatinine: 1.1 mg/dL (ref 0.6–1.1)
EGFR: 65 mL/min/{1.73_m2} — ABNORMAL LOW (ref >90)
Glucose: 91 mg/dl (ref 70–140)
Potassium: 3.8 mEq/L (ref 3.5–5.1)
Sodium: 140 mEq/L (ref 136–145)
Total Bilirubin: 0.35 mg/dL (ref 0.20–1.20)
Total Protein: 7.2 g/dL (ref 6.4–8.3)

## 2015-03-03 LAB — CBC WITH DIFFERENTIAL (CANCER CENTER ONLY)
BASO#: 0.1 10*3/uL (ref 0.0–0.2)
BASO%: 1.7 % (ref 0.0–2.0)
EOS%: 2.5 % (ref 0.0–7.0)
Eosinophils Absolute: 0.1 10*3/uL (ref 0.0–0.5)
HCT: 34.7 % — ABNORMAL LOW (ref 34.8–46.6)
HGB: 11.5 g/dL — ABNORMAL LOW (ref 11.6–15.9)
LYMPH#: 1.3 10*3/uL (ref 0.9–3.3)
LYMPH%: 31.9 % (ref 14.0–48.0)
MCH: 27.4 pg (ref 26.0–34.0)
MCHC: 33.1 g/dL (ref 32.0–36.0)
MCV: 83 fL (ref 81–101)
MONO#: 0.6 10*3/uL (ref 0.1–0.9)
MONO%: 14.3 % — ABNORMAL HIGH (ref 0.0–13.0)
NEUT#: 2 10*3/uL (ref 1.5–6.5)
NEUT%: 49.6 % (ref 39.6–80.0)
Platelets: 241 10*3/uL (ref 145–400)
RBC: 4.2 10*6/uL (ref 3.70–5.32)
RDW: 17.4 % — ABNORMAL HIGH (ref 11.1–15.7)
WBC: 4.1 10*3/uL (ref 3.9–10.0)

## 2015-03-03 LAB — LACTATE DEHYDROGENASE: LDH: 165 U/L (ref 125–245)

## 2015-03-03 MED ORDER — ONDANSETRON HCL 8 MG PO TABS
8.0000 mg | ORAL_TABLET | Freq: Once | ORAL | Status: AC
  Administered 2015-03-03: 8 mg via ORAL

## 2015-03-03 MED ORDER — BORTEZOMIB CHEMO SQ INJECTION 3.5 MG (2.5MG/ML)
1.2000 mg/m2 | Freq: Once | INTRAMUSCULAR | Status: AC
  Administered 2015-03-03: 2.5 mg via SUBCUTANEOUS
  Filled 2015-03-03: qty 2.5

## 2015-03-03 MED ORDER — POMALIDOMIDE 1 MG PO CAPS: ORAL_CAPSULE | ORAL | Status: DC

## 2015-03-03 MED ORDER — ONDANSETRON HCL 8 MG PO TABS
ORAL_TABLET | ORAL | Status: AC
  Filled 2015-03-03: qty 1

## 2015-03-03 NOTE — Addendum Note (Signed)
Encounter addended by: Jacqulyn Liner, RN on: 03/03/2015  3:58 PM<BR>     Documentation filed: Charges VN

## 2015-03-03 NOTE — Progress Notes (Signed)
Radiation Oncology         [phone] ________________________________  Name: Emily Greer MRN: [id number]  Date: 03/03/2015  DOB: [birth year]  Re-Consultation Note  CC: Foye Spurling, MD  Everitt Amber, MD  Diagnosis:  Recurrent endometrial cancer  Interval Since Last Radiation:  2 years and 6  months in which the patient completed intracavitary brachytherapy treatments for recurrence. (June 4, June 11, June 19, June 25, July 29, 2012)  Site/dose: Vagina,  30.5 Gy in 5 fractions (6 Gy, 6 Gy, 6 Gy, 6 Gy, 6.5 Gy)  Narrative:  The patient returns today for a re-consultation with radiation oncology after 02/15/15 CT scan revealing no xray evidence of recurrent endometrial carcinoma. She reports having burning with urination and light vaginal spotting with micturition since September 2016. She was seen by Dr. Leo Grosser on 01/21/15 at which time a friable lesion was noted at the vaginal cuff. This was biopsied and was consistent with endometrial adenocarcinoma (recurrence). She reports having a Pap smear on 01/21/15 that was normal. She was seen by Dr. Denman George on 01/29/15. She carries the diagnosis of myeloma (recurrent lambda light chain myeloma) and underwent a second autologous stem cell transplant on 07/24/14 at [facility]. She was admitted to the hospital in November 2016 with pneumonia. The patient presents to the clinic with her husband.  After discussion in multidisciplinary clinic, it was recommended that the pelvic region be treated with external beam radiation, considering previous intracavitary brachytherapy. We also discussed local central pelvis radiation therapy in light of the patient's prior history of multiple myeloma and the fact that her recurrence is always been within the vaginal region and not nodal recurrences.  On today's visit, the patient notes occasional bleeding after her biopsy.   ALLERGIES:  is allergic to codeine.  Meds: Current Outpatient Prescriptions  Medication Sig  Dispense Refill  . acyclovir (ZOVIRAX) 400 MG tablet Take 1 tablet (400 mg total) by mouth 2 (two) times daily. 60 tablet 2  . albuterol (PROVENTIL HFA;VENTOLIN HFA) 108 (90 BASE) MCG/ACT inhaler Inhale 2 puffs into the lungs every 6 (six) hours as needed for wheezing or shortness of breath. 2 puffs 3 times daily x 5 days then every 6 hours as needed. 1 Inhaler 0  . amLODipine (NORVASC) 10 MG tablet TAKE 1 TABLET BY MOUTH DAILY    . bortezomib IV (VELCADE) 3.5 MG injection Inject 1.3 mg/m2 into the vein once. Patient is taking 2.5 MG injection every other week    . Cholecalciferol (VITAMIN D3) 2000 UNITS TABS Take 1 tablet by mouth daily.    [name] Kitchen loratadine (CLARITIN) 10 MG tablet Take 10 mg by mouth every morning.     . ondansetron (ZOFRAN) 8 MG tablet Take 8 mg by mouth every 8 (eight) hours as needed for nausea or vomiting.    [name] Kitchen oxymetazoline (AFRIN) 0.05 % nasal spray Place 1 spray into both nostrils 2 (two) times daily. Reported on 01/29/2015    . potassium chloride SA (K-DUR,KLOR-CON) 20 MEQ tablet Take 1 tablet (20 mEq total) by mouth 2 (two) times daily. 60 tablet 3  . triamterene-hydrochlorothiazide (MAXZIDE-25) 37.5-25 MG per tablet Take 1 tablet by mouth daily. 30 tablet 6  . benzonatate (TESSALON) 200 MG capsule Take 200 mg by mouth 3 (three) times daily as needed for cough. Reported on 03/03/2015    . guaiFENesin (MUCINEX) 600 MG 12 hr tablet Take 2 tablets (1,200 mg total) by mouth 2 (two) times daily. Take for 5 days then stop. (Patient  not taking: Reported on 03/03/2015) 20 tablet 0  . pomalidomide (POMALYST) 1 MG capsule Take with water on days 1-21. Repeat every 28 days. [id number] 21 capsule 0   No current facility-administered medications for this encounter.    Physical Findings: The patient is in no acute distress.   weight is 190 lb 14.4 oz (86.592 kg). Her oral temperature is 98.3 F (36.8 C). Her blood pressure is 142/76 and her pulse is 97. Her oxygen saturation is 99%.     General: Alert and oriented, in no acute distress Heart: Regular in rate and rhythm with no murmurs, rubs, or gallops. Chest: Clear to auscultation bilaterally, with no rhonchi, wheezes, or rales. Abdomen: Soft, nontender, nondistended, with no rigidity or guarding. Extremities: No cyanosis or edema. Lymphatics: see Neck Exam Skin: No concerning lesions. Musculoskeletal: symmetric strength and muscle tone throughout. Psychiatric: Judgment and insight are intact. Affect is appropriate. Pelvic: On speculum, an approximately 1 cm nodule at the posterior aspect of the vaginal cuff is seen. Serosanguineous fluid present in the proximal vagina. On palpation, the mass measures approximately 1 cm. Does not appear to have significant thickness on bimanual exam.   Lab Findings: Lab Results  Component Value Date   WBC 4.1 03/03/2015   HGB 11.5* 03/03/2015   HCT 34.7* 03/03/2015   MCV 83 03/03/2015   PLT 241 03/03/2015    Radiographic Findings: Ct Chest W Contrast  02/15/2015  CLINICAL DATA:  Recurrent endometrial carcinoma at vaginal cuff. Undergoing chemotherapy. Previous radiation therapy. Multiple myeloma. EXAM: CT CHEST, ABDOMEN, AND PELVIS WITH CONTRAST TECHNIQUE: Multidetector CT imaging of the chest, abdomen and pelvis was performed following the standard protocol during bolus administration of intravenous contrast. CONTRAST:  119m OMNIPAQUE IOHEXOL 300 MG/ML  SOLN COMPARISON:  06/05/2012 FINDINGS: CT CHEST FINDINGS Mediastinum/Lymph Nodes: No masses, pathologically enlarged lymph nodes, or other significant abnormality. Lungs/Pleura: No pulmonary mass, infiltrate, or effusion. Musculoskeletal: Diffuse lytic bone lesions are stable in appearance and consistent with known multiple myeloma. CT ABDOMEN PELVIS FINDINGS Hepatobiliary: No masses or other significant abnormality. Gallbladder is unremarkable. Pancreas: No mass, inflammatory changes, or other significant abnormality. Spleen: Within  normal limits in size and appearance. Adrenals/Urinary Tract: No masses identified. No evidence of hydronephrosis. Stomach/Bowel: No evidence of obstruction, inflammatory process, or abnormal fluid collections. Vascular/Lymphatic: No pathologically enlarged lymph nodes. No evidence of abdominal aortic aneursym. Reproductive: Prior hysterectomy again noted. No abnormal soft tissue mass seen involving vaginal cuff for hysterectomy bed. Adnexal regions are unremarkable. Other: None. Musculoskeletal: Diffuse lytic bone lesions are again seen throughout the spine and pelvis, which are stable in appearance. This is consistent with known multiple myeloma. IMPRESSION: No evidence of recurrent or metastatic endometrial carcinoma. Diffuse lytic bone lesions remain stable in appearance, consistent with known history of multiple myeloma. Electronically Signed   By: JEarle GellM.D.   On: 02/15/2015 18:24   Ct Abdomen Pelvis W Contrast  02/15/2015  CLINICAL DATA:  Recurrent endometrial carcinoma at vaginal cuff. Undergoing chemotherapy. Previous radiation therapy. Multiple myeloma. EXAM: CT CHEST, ABDOMEN, AND PELVIS WITH CONTRAST TECHNIQUE: Multidetector CT imaging of the chest, abdomen and pelvis was performed following the standard protocol during bolus administration of intravenous contrast. CONTRAST:  1046mOMNIPAQUE IOHEXOL 300 MG/ML  SOLN COMPARISON:  06/05/2012 FINDINGS: CT CHEST FINDINGS Mediastinum/Lymph Nodes: No masses, pathologically enlarged lymph nodes, or other significant abnormality. Lungs/Pleura: No pulmonary mass, infiltrate, or effusion. Musculoskeletal: Diffuse lytic bone lesions are stable in appearance and consistent with known multiple myeloma. CT ABDOMEN  PELVIS FINDINGS Hepatobiliary: No masses or other significant abnormality. Gallbladder is unremarkable. Pancreas: No mass, inflammatory changes, or other significant abnormality. Spleen: Within normal limits in size and appearance. Adrenals/Urinary  Tract: No masses identified. No evidence of hydronephrosis. Stomach/Bowel: No evidence of obstruction, inflammatory process, or abnormal fluid collections. Vascular/Lymphatic: No pathologically enlarged lymph nodes. No evidence of abdominal aortic aneursym. Reproductive: Prior hysterectomy again noted. No abnormal soft tissue mass seen involving vaginal cuff for hysterectomy bed. Adnexal regions are unremarkable. Other: None. Musculoskeletal: Diffuse lytic bone lesions are again seen throughout the spine and pelvis, which are stable in appearance. This is consistent with known multiple myeloma. IMPRESSION: No evidence of recurrent or metastatic endometrial carcinoma. Diffuse lytic bone lesions remain stable in appearance, consistent with known history of multiple myeloma. Electronically Signed   By: Earle Gell M.D.   On: 02/15/2015 18:24    Impression: Emily Greer is a 65 year old woman presenting to clinic in regards to her recurrent endometrial cancer. She now has a further recurrence at the vaginal cuff. With her previous treatment the patient had two areas of recurrence with one at the distal vaginal area and one at the vaginal cuff, therefore the entire vaginal vault was treated at that time.  Intracavitary brachytherapy is not a good option for the patient since the entire vaginal vault was treated with her previous brachytherapy.   It is of the recommendation of the multidisciplinary clinic that we go ahead and proceed with external beam radiation treatment to the pelvic region. I anticipate a course of 25 treatments. In addition at the conference surgical intervention was discussed but this approach may be very difficult and may result in necessity of an anterior exenteration for the patient.  Plan: I spoke to the patient today regarding her diagnosis and options for treatment. We discussed the process of CT simulation and the placement tattoos. We discussed approximately 5 weeks of external  beam radiation treatment as an outpatient. We discussed the low likelihood of secondary malignancies. We discussed this to be a curative treatment. We discussed the possible side effects including but not limited to skin redness, fatigue, permanent skin darkening, nausea, diarrhea, and/or rectal bleeding. We also discussed that her treatment will be somewhat complicated in the fact that she has history of multiple myeloma as well as recurrent endometrial cancer.  The patient will proceed with simulation planning today.  -----------------------------------  Blair Promise, PhD, MD  This document serves as a record of services personally performed by Gery Pray, MD. It was created on his behalf by Darcus Austin, a trained medical scribe. The creation of this record is based on the scribe's personal observations and the provider's statements to them. This document has been checked and approved by the attending provider.

## 2015-03-03 NOTE — Patient Instructions (Signed)
Bortezomib injection What is this medicine? BORTEZOMIB (bor TEZ oh mib) is a medicine that targets proteins in cancer cells and stops the cancer cells from growing. It is used to treat multiple myeloma and mantle-cell lymphoma. This medicine may be used for other purposes; ask your health care provider or pharmacist if you have questions. What should I tell my health care provider before I take this medicine? They need to know if you have any of these conditions: -diabetes -heart disease -irregular heartbeat -liver disease -on hemodialysis -low blood counts, like low white blood cells, platelets, or hemoglobin -peripheral neuropathy -taking medicine for blood pressure -an unusual or allergic reaction to bortezomib, mannitol, boron, other medicines, foods, dyes, or preservatives -pregnant or trying to get pregnant -breast-feeding How should I use this medicine? This medicine is for injection into a vein or for injection under the skin. It is given by a health care professional in a hospital or clinic setting. Talk to your pediatrician regarding the use of this medicine in children. Special care may be needed. Overdosage: If you think you have taken too much of this medicine contact a poison control center or emergency room at once. NOTE: This medicine is only for you. Do not share this medicine with others. What if I miss a dose? It is important not to miss your dose. Call your doctor or health care professional if you are unable to keep an appointment. What may interact with this medicine? This medicine may interact with the following medications: -ketoconazole -rifampin -ritonavir -St. John's Wort This list may not describe all possible interactions. Give your health care provider a list of all the medicines, herbs, non-prescription drugs, or dietary supplements you use. Also tell them if you smoke, drink alcohol, or use illegal drugs. Some items may interact with your medicine. What  should I watch for while using this medicine? Visit your doctor for checks on your progress. This drug may make you feel generally unwell. This is not uncommon, as chemotherapy can affect healthy cells as well as cancer cells. Report any side effects. Continue your course of treatment even though you feel ill unless your doctor tells you to stop. You may get drowsy or dizzy. Do not drive, use machinery, or do anything that needs mental alertness until you know how this medicine affects you. Do not stand or sit up quickly, especially if you are an older patient. This reduces the risk of dizzy or fainting spells. In some cases, you may be given additional medicines to help with side effects. Follow all directions for their use. Call your doctor or health care professional for advice if you get a fever, chills or sore throat, or other symptoms of a cold or flu. Do not treat yourself. This drug decreases your body's ability to fight infections. Try to avoid being around people who are sick. This medicine may increase your risk to bruise or bleed. Call your doctor or health care professional if you notice any unusual bleeding. You may need blood work done while you are taking this medicine. In some patients, this medicine may cause a serious brain infection that may cause death. If you have any problems seeing, thinking, speaking, walking, or standing, tell your doctor right away. If you cannot reach your doctor, urgently seek other source of medical care. Do not become pregnant while taking this medicine. Women should inform their doctor if they wish to become pregnant or think they might be pregnant. There is a potential for serious  side effects to an unborn child. Talk to your health care professional or pharmacist for more information. Do not breast-feed an infant while taking this medicine. Check with your doctor or health care professional if you get an attack of severe diarrhea, nausea and vomiting, or if  you sweat a lot. The loss of too much body fluid can make it dangerous for you to take this medicine. What side effects may I notice from receiving this medicine? Side effects that you should report to your doctor or health care professional as soon as possible: -allergic reactions like skin rash, itching or hives, swelling of the face, lips, or tongue -breathing problems -changes in hearing -changes in vision -fast, irregular heartbeat -feeling faint or lightheaded, falls -pain, tingling, numbness in the hands or feet -right upper belly pain -seizures -swelling of the ankles, feet, hands -unusual bleeding or bruising -unusually weak or tired -vomiting -yellowing of the eyes or skin Side effects that usually do not require medical attention (report to your doctor or health care professional if they continue or are bothersome): -changes in emotions or moods -constipation -diarrhea -loss of appetite -headache -irritation at site where injected -nausea This list may not describe all possible side effects. Call your doctor for medical advice about side effects. You may report side effects to FDA at 1-800-FDA-1088. Where should I keep my medicine? This drug is given in a hospital or clinic and will not be stored at home. NOTE: This sheet is a summary. It may not cover all possible information. If you have questions about this medicine, talk to your doctor, pharmacist, or health care provider.    2016, Elsevier/Gold Standard. (2014-03-17 14:47:04)

## 2015-03-04 ENCOUNTER — Ambulatory Visit: Payer: BC Managed Care – PPO | Attending: Gynecologic Oncology | Admitting: Gynecologic Oncology

## 2015-03-04 ENCOUNTER — Encounter: Payer: Self-pay | Admitting: Gynecologic Oncology

## 2015-03-04 VITALS — BP 123/78 | HR 89 | Temp 98.8°F | Resp 18 | Ht 64.0 in | Wt 188.8 lb

## 2015-03-04 DIAGNOSIS — Z9481 Bone marrow transplant status: Secondary | ICD-10-CM | POA: Insufficient documentation

## 2015-03-04 DIAGNOSIS — C9001 Multiple myeloma in remission: Secondary | ICD-10-CM | POA: Insufficient documentation

## 2015-03-04 DIAGNOSIS — C7982 Secondary malignant neoplasm of genital organs: Secondary | ICD-10-CM | POA: Insufficient documentation

## 2015-03-04 DIAGNOSIS — Z923 Personal history of irradiation: Secondary | ICD-10-CM | POA: Insufficient documentation

## 2015-03-04 DIAGNOSIS — C541 Malignant neoplasm of endometrium: Secondary | ICD-10-CM | POA: Insufficient documentation

## 2015-03-04 NOTE — Patient Instructions (Signed)
Plan to follow up with Dr. Skeet Latch in April or sooner if needed.  Please call if you would like a referral to the [facility].  After radiation, beginning Femara will be considered.  Please call for any questions or concerns.  Letrozole tablets What is this medicine? LETROZOLE (LET roe zole) blocks the production of estrogen. Certain types of breast cancer grow under the influence of estrogen. Letrozole helps block tumor growth. This medicine is used to treat advanced breast cancer in postmenopausal women. This medicine may be used for other purposes; ask your health care provider or pharmacist if you have questions. What should I tell my health care provider before I take this medicine? They need to know if you have any of these conditions: -liver disease -osteoporosis (weak bones) -an unusual or allergic reaction to letrozole, other medicines, foods, dyes, or preservatives -pregnant or trying to get pregnant -breast-feeding How should I use this medicine? Take this medicine by mouth with a glass of water. You may take it with or without food. Follow the directions on the prescription label. Take your medicine at regular intervals. Do not take your medicine more often than directed. Do not stop taking except on your doctor's advice. Talk to your pediatrician regarding the use of this medicine in children. Special care may be needed. Overdosage: If you think you have taken too much of this medicine contact a poison control center or emergency room at once. NOTE: This medicine is only for you. Do not share this medicine with others. What if I miss a dose? If you miss a dose, take it as soon as you can. If it is almost time for your next dose, take only that dose. Do not take double or extra doses. What may interact with this medicine? Do not take this medicine with any of the following medications: -estrogens, like hormone replacement therapy or birth control pills This medicine may  also interact with the following medications: -dietary supplements such as androstenedione or DHEA -prasterone -tamoxifen This list may not describe all possible interactions. Give your health care provider a list of all the medicines, herbs, non-prescription drugs, or dietary supplements you use. Also tell them if you smoke, drink alcohol, or use illegal drugs. Some items may interact with your medicine. What should I watch for while using this medicine? Visit your doctor or health care professional for regular check-ups to monitor your condition. Do not use this drug if you are pregnant. Serious side effects to an unborn child are possible. Talk to your doctor or pharmacist for more information. You may get drowsy or dizzy. Do not drive, use machinery, or do anything that needs mental alertness until you know how this medicine affects you. Do not stand or sit up quickly, especially if you are an older patient. This reduces the risk of dizzy or fainting spells. What side effects may I notice from receiving this medicine? Side effects that you should report to your doctor or health care professional as soon as possible: -allergic reactions like skin rash, itching, or hives -bone fracture -chest pain -difficulty breathing or shortness of breath -severe pain, swelling, warmth in the leg -unusually weak or tired -vaginal bleeding Side effects that usually do not require medical attention (report to your doctor or health care professional if they continue or are bothersome): -bone, back, joint, or muscle pain -dizziness -fatigue -fluid retention -headache -hot flashes, night sweats -nausea -weight gain This list may not describe all possible side effects. Call your  doctor for medical advice about side effects. You may report side effects to FDA at 1-800-FDA-1088. Where should I keep my medicine? Keep out of the reach of children. Store between 15 and 30 degrees C (59 and 86 degrees F).  Throw away any unused medicine after the expiration date. NOTE: This sheet is a summary. It may not cover all possible information. If you have questions about this medicine, talk to your doctor, pharmacist, or health care provider.    2016, Elsevier/Gold Standard. (2007-03-29 16:43:44)

## 2015-03-04 NOTE — Progress Notes (Signed)
Office Visit:  GYN ONCOLOGY   CC:   Recurrent endometrial cancer    Assessment:   64 y.o.  year old with Stage1A Grade 2 endometrioid endometrial cancer staged 10/2011 with recurrence at the distal vagina identified in April 2014.  Subsequent PET scan was negative for evidence of metastatic disease and Emily M WoodwardCompleted vaginal  brachytherapy 07/29/2012 Second vaginal recurrence (apex) diagnosed December 2016. Imaging without evidence of  etastatic disease.    Options discussed at multidisciplinary tumor conference 1. Chemotherapy.  This would jeopardize her mulitple myeloma status and so is not an option 2.. Conformational radiotherapy 3.Hormonal therapy 4. Anterior exenteration.  Plan to treat with conformational radiotherapy with subsequent Femara.  If disease recurs centrally will then consider exenteration  IHCC positive testing Referral for genetic counselling   HPI:  Emily Greer is a 64 y.o. year old G3P2 initially seen in consultation on 10/05/2011  grade 1  endometrial cancer  She then underwent a  total abdominal hysterectomy bilateral salpingo-oophorectomy bilateral pelvic lymph node dissection on  10/31/2011 without complications.  Her postoperative course was  uncomplicated.  Her final pathologic diagnosis is a Stage  1A Grade  2 endometrioid endometrial cancer with  negative lymphovascular space invasion,  2/20 (10%) of myometrial invasion and negative lymph nodes.  On 1/14 visit she reported  post coital vaginal bleeding  that was self limiting. Vaginal biopsy - LARGELY DENUDED SQUAMOUS EPITHELIUM WITH ASSOCIATED SPONGIOSIS AND CHRONIC INFLAMMATION.- NO DYSPLASIA OR MALIGNANCY  On the visit 04/2012 she c/o vaginal bleeding.  Biopsies were c/w metastatic disease at the distal vagina  PET 06/05/2012 IMPRESSION:  1. There are no specific features identified to suggest  hypermetabolic metastasis from endometrial carcinoma.  2. Skeletal changes of multiple myeloma  with focal area of increased uptake   Treated with vaginal brachytherapy June 4, June 11, June 19, June 25, June 30/2014 Site/dose: Vagina, 30.5 Gy in 5 fractions (6 Gy, 6 Gy, 6 Gy, 6 Gy, 6.5 Gy)  Pap 08/17/2013 ASCUS HPV neg  Interval Hx: The patient was last seen by gyn onc on 10/29/13 with a normal exam. She carries the diagnosis of myeloma (recurrent lambda light chain myeloma) and underwent a second autologous stem cell transplant on 07/24/14 at [facility]. She was admitted to hospital in November 2016 with pneumonia.  She reports light vaginal spotting with micturition since September 2016. She was seen by Dr Haygood on 01/21/15 at which time a friable lesion was noted at the vaginal cuff. This was biopsied and was consistent with endometrioid adenocarcinoma (recurrence).  CT C/A/P 02/15/2015 without evidence of metastatic disease.  Care complicated by transplant 2016 for Rx multiple myeloma.   Past Medical History  Diagnosis Date  . Lambda light chain myeloma (HCC) 11/11/2007  . Hypertension 11/28/97  . H/O multiple myeloma 11/28/1997  . Staphylococcus aureus bacteremia 11/28/1997  . Staphylococcus epidermidis bacteremia 11/28/97  . Pregnancy induced hypertension   . Osteoporosis 12/18/05    Increased  risk   . Increased BMI 12/18/05  . Post-menopausal bleeding 12/19/06  . Vaginal atrophy 12/19/06  . Elevated hemoglobin A1c     Borderline  . Fibroid 07/26/10    asymptomatic  . Vaginal atrophy   . PONV (postoperative nausea and vomiting)     nausea in past, none recent  . Endometrial carcinoma (HCC) 05/28/12  . History of radiation therapy 6/4, 6/11, 6/19, 6/25, 07/29/2012    vagina 30.5 gray in 5 fx, HDR brachytherapy     Past Surgical   History  Procedure Laterality Date  . Tubal ligation  1986  . Ectopic pregnancy surgery  1992  . Hysteroscopy w/d&c  09/27/2011    Procedure: DILATATION AND CURETTAGE /HYSTEROSCOPY;  Surgeon: Eldred Manges, MD;  Location: [facility];  Service:  Gynecology;;  . Laparotomy  10/31/2011    Procedure: EXPLORATORY LAPAROTOMY;  Surgeon: [name], MD PHD;  Location: [facility];  Service: Gynecology;  Laterality: N/A;  . Abdominal hysterectomy  10/31/2011    Procedure: HYSTERECTOMY ABDOMINAL;  Surgeon: [name], MD PHD;  Location: [facility];  Service: Gynecology;  Laterality: N/A;  . Salpingoophorectomy  10/31/2011    Procedure: SALPINGO OOPHERECTOMY;  Surgeon: [name], MD PHD;  Location: [facility];  Service: Gynecology;  Laterality: Bilateral;   Lymph Nodes sampling   Social History: Retired from [employer].  Her husband is well, also retired.   Review of systems: Constitutional:  She has no fever or chills. No changes in weight.  Cardiovascular: No chest pain, palpitations or edema. Respiratory:  No shortness of breath, wheezing or cough Gastrointestinal: She has normal bowel movements without diarrhea or constipation. She denies any nausea or vomiting.   Genitourinary:  She denies pelvic pain, pelvic pressure or changes in her urinary function. + vaginal bleeding (light) Musculoskeletal:  No changes in gait or joint pain Otherwise uninformative 10 point review of system   Physical Exam: Blood pressure 123/78, pulse 89, temperature 98.8 F (37.1 C), temperature source Oral, resp. rate 18, height 5' 4" (1.626 m), weight 188 lb 12.8 oz (85.639 kg), SpO2 98 %. Wt Readings from Last 3 Encounters:  03/04/15 188 lb 12.8 oz (85.639 kg)  03/03/15 190 lb 14.4 oz (86.592 kg)  02/15/15 190 lb (86.183 kg)    General: Well dressed, well nourished in no apparent distress.   Lungs:  Clear to auscultation bilaterally.  No wheezes. Cardiovascular:  Regular rate and rhythm.  No murmurs or rubs. Abdomen:  Soft, nontender, nondistended.  No palpable masses.  No hepatosplenomegaly.  No ascites. Normal bowel sounds.  No hernias.  Incision is clean dry and intact without any evidence of masses or hernia   Genitourinary: Normal EGBUS   Vaginal cuff 18m friable erythematous lesion/papule at mid vaginal apex. No lesions on distal vagina. No palpable mass and no extension that is appreciated on rectovaginal exam. Back: No CVA tenderness  LN:  No cervical supraclavicular or inguinal adenopathy  Extremities: No cyanosis, clubbing or edema.  No calf tenderness or erythema Musculoskeletal: No pain, normal strength and range of motion.  25 minutes of face to face counseling was spent with the patient discussing her diagnosis and prognosis, and treatment planning and options. BJanie Morning MD.

## 2015-03-08 DIAGNOSIS — Z510 Encounter for antineoplastic radiation therapy: Secondary | ICD-10-CM | POA: Diagnosis not present

## 2015-03-08 NOTE — Progress Notes (Signed)
  Radiation Oncology         [phone] ________________________________  Name: Emily Greer MRN: [id number]  Date: 03/03/2015  DOB: 1950-03-07  SIMULATION AND TREATMENT PLANNING NOTE    ICD-9-CM ICD-10-CM   1. Cancer involving vagina by non-direct metastasis from endometrium ([place]) 198.82 C79.82    182.0 C54.1     DIAGNOSIS:  Recurrent endometrial cancer  NARRATIVE:  The patient was brought to the [facility].  Identity was confirmed.  All relevant records and images related to the planned course of therapy were reviewed.  The patient freely provided informed written consent to proceed with treatment after reviewing the details related to the planned course of therapy. The consent form was witnessed and verified by the simulation staff.  Then, the patient was set-up in a stable reproducible  supine position for radiation therapy.  CT images were obtained.  Surface markings were placed.  The CT images were loaded into the planning software.  Then the target and avoidance structures were contoured.  Treatment planning then occurred.  The radiation prescription was entered and confirmed.  Then, I designed and supervised the construction of a total of 3 medically necessary complex treatment devices.  I have requested : Intensity Modulated Radiotherapy (IMRT) is medically necessary for this case for the following reason:  Previous treatment to this area..  I have ordered:dose calc.  PLAN:  The patient will receive 50.4 Gy in 28 fractions.  ________________________________   Special treatment procedure note:  Additional time was taken in reviewing the patient's previous treatment as it relates to her current set up. Given this additional time and the increased potential for toxicities with overlapping fields, this constitutes a special treatment procedure -----------------------------------  Blair Promise, PhD, MD

## 2015-03-10 ENCOUNTER — Ambulatory Visit
Admission: RE | Admit: 2015-03-10 | Discharge: 2015-03-10 | Disposition: A | Discharge status: Home / Self Care | Payer: BC Managed Care – PPO | Source: Ambulatory Visit | Attending: Radiation Oncology | Admitting: Radiation Oncology

## 2015-03-10 ENCOUNTER — Ambulatory Visit: Payer: BC Managed Care – PPO | Admitting: Radiation Oncology

## 2015-03-10 DIAGNOSIS — Z510 Encounter for antineoplastic radiation therapy: Secondary | ICD-10-CM | POA: Diagnosis not present

## 2015-03-10 DIAGNOSIS — C7982 Secondary malignant neoplasm of genital organs: Principal | ICD-10-CM

## 2015-03-10 DIAGNOSIS — C541 Malignant neoplasm of endometrium: Secondary | ICD-10-CM

## 2015-03-10 NOTE — Progress Notes (Signed)
  Radiation Oncology         [phone] ________________________________  Name: Emily Greer MRN: [id number]  Date: 03/10/2015  DOB: [birth year]  Simulation Verification Note    ICD-9-CM ICD-10-CM   1. Cancer involving vagina by non-direct metastasis from endometrium ([place]) 198.82 C79.82    182.0 C54.1     Status: outpatient  NARRATIVE: The patient was brought to the treatment unit and placed in the planned treatment position. The clinical setup was verified. Then port films were obtained and uploaded to the radiation oncology medical record software.  The treatment beams were carefully compared against the planned radiation fields. The position location and shape of the radiation fields was reviewed. They targeted volume of tissue appears to be appropriately covered by the radiation beams. Organs at risk appear to be excluded as planned.  Based on my personal review, I approved the simulation verification. The patient's treatment will proceed as planned.  -----------------------------------  Blair Promise, PhD, MD

## 2015-03-11 ENCOUNTER — Ambulatory Visit
Admission: RE | Admit: 2015-03-11 | Discharge: 2015-03-11 | Disposition: A | Discharge status: Home / Self Care | Payer: BC Managed Care – PPO | Source: Ambulatory Visit | Attending: Radiation Oncology | Admitting: Radiation Oncology

## 2015-03-11 ENCOUNTER — Ambulatory Visit: Payer: BC Managed Care – PPO | Admitting: Radiation Oncology

## 2015-03-11 DIAGNOSIS — Z510 Encounter for antineoplastic radiation therapy: Secondary | ICD-10-CM | POA: Diagnosis not present

## 2015-03-12 ENCOUNTER — Ambulatory Visit
Admission: RE | Admit: 2015-03-12 | Discharge: 2015-03-12 | Disposition: A | Discharge status: Home / Self Care | Payer: BC Managed Care – PPO | Source: Ambulatory Visit | Attending: Radiation Oncology | Admitting: Radiation Oncology

## 2015-03-12 DIAGNOSIS — C541 Malignant neoplasm of endometrium: Secondary | ICD-10-CM

## 2015-03-12 DIAGNOSIS — C7982 Secondary malignant neoplasm of genital organs: Principal | ICD-10-CM

## 2015-03-12 DIAGNOSIS — Z510 Encounter for antineoplastic radiation therapy: Secondary | ICD-10-CM | POA: Diagnosis not present

## 2015-03-12 NOTE — Progress Notes (Signed)
Pt here for patient teaching.  Pt given Radiation and You booklet. Pt reports they have not watched the Radiation Therapy Education video and has been given the link to watch the video at home.  Reviewed areas of pertinence such as diarrhea, fatigue, nausea and vomiting, skin changes and urinary and bladder changes . Pt able to give teach back of to pat skin, use unscented/gentle soap, have Imodium on hand, drink plenty of water and sitz bath,avoid applying anything to skin within 4 hours of treatment. Pt demonstrated understanding and verbalizes understanding of information given and will contact nursing with any questions or concerns.     [email or web address]/whattoexpect/index

## 2015-03-15 ENCOUNTER — Ambulatory Visit
Admission: RE | Admit: 2015-03-15 | Discharge: 2015-03-15 | Disposition: A | Discharge status: Home / Self Care | Payer: BC Managed Care – PPO | Source: Ambulatory Visit | Attending: Radiation Oncology | Admitting: Radiation Oncology

## 2015-03-15 DIAGNOSIS — Z510 Encounter for antineoplastic radiation therapy: Secondary | ICD-10-CM | POA: Diagnosis not present

## 2015-03-16 ENCOUNTER — Encounter: Payer: Self-pay | Admitting: Radiation Oncology

## 2015-03-16 ENCOUNTER — Ambulatory Visit
Admission: RE | Admit: 2015-03-16 | Discharge: 2015-03-16 | Disposition: A | Discharge status: Home / Self Care | Payer: BC Managed Care – PPO | Source: Ambulatory Visit | Attending: Radiation Oncology | Admitting: Radiation Oncology

## 2015-03-16 VITALS — BP 138/77 | HR 78 | Temp 98.5°F | Ht 64.0 in | Wt 189.4 lb

## 2015-03-16 DIAGNOSIS — C541 Malignant neoplasm of endometrium: Secondary | ICD-10-CM

## 2015-03-16 DIAGNOSIS — C7982 Secondary malignant neoplasm of genital organs: Principal | ICD-10-CM

## 2015-03-16 DIAGNOSIS — Z510 Encounter for antineoplastic radiation therapy: Secondary | ICD-10-CM | POA: Diagnosis not present

## 2015-03-16 NOTE — Progress Notes (Signed)
Emily Greer has completed 5 fractions to her pelvis.  She denies having any pain.  She reports that she has been having dysuria for a while and said it is a little better.  She reports having occasional vaginal spotting.  She denies having any bowel issues, nausea or fatigue.  She will have a Velcade injection tomorrow.  BP 138/77 mmHg  Pulse 78  Temp(Src) 98.5 F (36.9 C) (Oral)  Ht 5\' 4"  (1.626 m)  Wt 189 lb 6.4 oz (85.911 kg)  BMI 32.49 kg/m2

## 2015-03-16 NOTE — Progress Notes (Signed)
  Radiation Oncology         [phone] ________________________________  Name: Emily Greer MRN: [id number]  Date: 03/16/2015  DOB: Feb 12, 1950  Weekly Radiation Therapy Management    ICD-9-CM ICD-10-CM   1. 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) 198.82 C79.82    182.0 C54.1      Current Dose: 9.0 Gy     Planned Dose:  50.4 Gy  Narrative . . . . . . . . The patient presents for routine under treatment assessment.                                   She denies having any pain. She reports that she has been having dysuria for a while and said it is a little better. She reports having occasional vaginal spotting. She denies having any bowel issues, nausea or fatigue. She will have a Velcade injection tomorrow.                                 Set-up films were reviewed.                                 The chart was checked. Physical Findings. . .  height is 5\' 4"  (1.626 m) and weight is 189 lb 6.4 oz (85.911 kg). Her oral temperature is 98.5 F (36.9 C). Her blood pressure is 138/77 and her pulse is 78. . The lungs are clear. The heart has a regular rhythm and rate. The abdomen is soft and nontender with normal bowel sounds. Impression . . . . . . . The patient is tolerating radiation. Plan . . . . . . . . . . . . Continue treatment as planned.  ________________________________   Blair Promise, PhD, MD

## 2015-03-17 ENCOUNTER — Ambulatory Visit (HOSPITAL_BASED_OUTPATIENT_CLINIC_OR_DEPARTMENT_OTHER): Payer: BC Managed Care – PPO | Admitting: Hematology & Oncology

## 2015-03-17 ENCOUNTER — Encounter: Payer: Self-pay | Admitting: Hematology & Oncology

## 2015-03-17 ENCOUNTER — Ambulatory Visit (HOSPITAL_BASED_OUTPATIENT_CLINIC_OR_DEPARTMENT_OTHER): Payer: BC Managed Care – PPO

## 2015-03-17 ENCOUNTER — Ambulatory Visit: Payer: BC Managed Care – PPO

## 2015-03-17 ENCOUNTER — Other Ambulatory Visit (HOSPITAL_BASED_OUTPATIENT_CLINIC_OR_DEPARTMENT_OTHER): Payer: BC Managed Care – PPO

## 2015-03-17 ENCOUNTER — Ambulatory Visit: Payer: BC Managed Care – PPO | Admitting: Family

## 2015-03-17 ENCOUNTER — Ambulatory Visit
Admission: RE | Admit: 2015-03-17 | Discharge: 2015-03-17 | Disposition: A | Discharge status: Home / Self Care | Payer: BC Managed Care – PPO | Source: Ambulatory Visit | Attending: Radiation Oncology | Admitting: Radiation Oncology

## 2015-03-17 ENCOUNTER — Other Ambulatory Visit: Payer: BC Managed Care – PPO

## 2015-03-17 VITALS — BP 128/73 | HR 87 | Temp 97.9°F | Resp 16 | Ht 64.0 in | Wt 188.0 lb

## 2015-03-17 DIAGNOSIS — C9000 Multiple myeloma not having achieved remission: Secondary | ICD-10-CM

## 2015-03-17 DIAGNOSIS — Z510 Encounter for antineoplastic radiation therapy: Secondary | ICD-10-CM | POA: Diagnosis not present

## 2015-03-17 DIAGNOSIS — C7982 Secondary malignant neoplasm of genital organs: Principal | ICD-10-CM

## 2015-03-17 DIAGNOSIS — Z5112 Encounter for antineoplastic immunotherapy: Secondary | ICD-10-CM | POA: Diagnosis not present

## 2015-03-17 DIAGNOSIS — C541 Malignant neoplasm of endometrium: Secondary | ICD-10-CM

## 2015-03-17 DIAGNOSIS — C9002 Multiple myeloma in relapse: Secondary | ICD-10-CM

## 2015-03-17 LAB — COMPREHENSIVE METABOLIC PANEL
ALT: 14 U/L (ref 0–55)
AST: 17 U/L (ref 5–34)
Albumin: 4.2 g/dL (ref 3.5–5.0)
Alkaline Phosphatase: 47 U/L (ref 40–150)
Anion Gap: 11 mEq/L (ref 3–11)
BUN: 21.3 mg/dL (ref 7.0–26.0)
CO2: 25 mEq/L (ref 22–29)
Calcium: 9.6 mg/dL (ref 8.4–10.4)
Chloride: 106 mEq/L (ref 98–109)
Creatinine: 1 mg/dL (ref 0.6–1.1)
EGFR: 72 mL/min/{1.73_m2} — ABNORMAL LOW (ref >90)
Glucose: 89 mg/dl (ref 70–140)
Potassium: 3.2 mEq/L — ABNORMAL LOW (ref 3.5–5.1)
Sodium: 141 mEq/L (ref 136–145)
Total Bilirubin: 0.54 mg/dL (ref 0.20–1.20)
Total Protein: 7.3 g/dL (ref 6.4–8.3)

## 2015-03-17 LAB — CBC WITH DIFFERENTIAL (CANCER CENTER ONLY)
BASO#: 0 10*3/uL (ref 0.0–0.2)
BASO%: 0.7 % (ref 0.0–2.0)
EOS%: 1.4 % (ref 0.0–7.0)
Eosinophils Absolute: 0.1 10*3/uL (ref 0.0–0.5)
HCT: 34.2 % — ABNORMAL LOW (ref 34.8–46.6)
HGB: 11.4 g/dL — ABNORMAL LOW (ref 11.6–15.9)
LYMPH#: 1 10*3/uL (ref 0.9–3.3)
LYMPH%: 24.4 % (ref 14.0–48.0)
MCH: 27.4 pg (ref 26.0–34.0)
MCHC: 33.3 g/dL (ref 32.0–36.0)
MCV: 82 fL (ref 81–101)
MONO#: 0.4 10*3/uL (ref 0.1–0.9)
MONO%: 8.6 % (ref 0.0–13.0)
NEUT#: 2.7 10*3/uL (ref 1.5–6.5)
NEUT%: 64.9 % (ref 39.6–80.0)
Platelets: 255 10*3/uL (ref 145–400)
RBC: 4.16 10*6/uL (ref 3.70–5.32)
RDW: 16.1 % — ABNORMAL HIGH (ref 11.1–15.7)
WBC: 4.2 10*3/uL (ref 3.9–10.0)

## 2015-03-17 LAB — LACTATE DEHYDROGENASE: LDH: 182 U/L (ref 125–245)

## 2015-03-17 MED ORDER — ONDANSETRON HCL 8 MG PO TABS
8.0000 mg | ORAL_TABLET | Freq: Once | ORAL | Status: AC
  Administered 2015-03-17: 8 mg via ORAL

## 2015-03-17 MED ORDER — ONDANSETRON HCL 8 MG PO TABS
ORAL_TABLET | ORAL | Status: AC
  Filled 2015-03-17: qty 1

## 2015-03-17 MED ORDER — BORTEZOMIB CHEMO SQ INJECTION 3.5 MG (2.5MG/ML)
1.2000 mg/m2 | Freq: Once | INTRAMUSCULAR | Status: AC
  Administered 2015-03-17: 2.5 mg via SUBCUTANEOUS
  Filled 2015-03-17: qty 2.5

## 2015-03-17 NOTE — Patient Instructions (Signed)
Cancer Center Discharge Instructions for Patients Receiving Chemotherapy  Today you received the following chemotherapy agents Velcade  To help prevent nausea and vomiting after your treatment, we encourage you to take your nausea medication    If you develop nausea and vomiting that is not controlled by your nausea medication, call the clinic.   BELOW ARE SYMPTOMS THAT SHOULD BE REPORTED IMMEDIATELY:  *FEVER GREATER THAN 100.5 F  *CHILLS WITH OR WITHOUT FEVER  NAUSEA AND VOMITING THAT IS NOT CONTROLLED WITH YOUR NAUSEA MEDICATION  *UNUSUAL SHORTNESS OF BREATH  *UNUSUAL BRUISING OR BLEEDING  TENDERNESS IN MOUTH AND THROAT WITH OR WITHOUT PRESENCE OF ULCERS  *URINARY PROBLEMS  *BOWEL PROBLEMS  UNUSUAL RASH Items with * indicate a potential emergency and should be followed up as soon as possible.  Feel free to call the clinic you have any questions or concerns. The clinic phone number is [phone].  Please show the CHEMO ALERT CARD at check-in to the Emergency Department and triage nurse.   

## 2015-03-18 ENCOUNTER — Ambulatory Visit
Admission: RE | Admit: 2015-03-18 | Discharge: 2015-03-18 | Disposition: A | Discharge status: Home / Self Care | Payer: BC Managed Care – PPO | Source: Ambulatory Visit | Attending: Radiation Oncology | Admitting: Radiation Oncology

## 2015-03-18 DIAGNOSIS — Z510 Encounter for antineoplastic radiation therapy: Secondary | ICD-10-CM | POA: Diagnosis not present

## 2015-03-18 LAB — KAPPA/LAMBDA LIGHT CHAINS
Ig Kappa Free Light Chain: 11.51 mg/L (ref 3.30–19.40)
Ig Lambda Free Light Chain: 11.72 mg/L (ref 5.71–26.30)
Kappa/Lambda FluidC Ratio: 0.98 (ref 0.26–1.65)

## 2015-03-18 LAB — IGG, IGA, IGM
IgA, Qn, Serum: 107 mg/dL (ref 87–352)
IgG, Qn, Serum: 844 mg/dL (ref 700–1600)
IgM, Qn, Serum: 37 mg/dL (ref 26–217)

## 2015-03-18 NOTE — Progress Notes (Signed)
Hematology and Oncology Follow Up Visit  KHARLI TROUNG [id number] 07/28/1950 65 y.o. 03/18/2015   Principle Diagnosis:   Recurrent lambda light chain myeloma  History of recurrent endometrial carcinoma  Current Therapy:    Status post second autologous stem cell transplant on 07/24/2014  Maintenance therapy with Pomalidomide/every 2 week Velcade  Aredia 60 mg IV Q. 3 month  Revision therapy for endometrial recurrence     Interim History:  Ms.  Degregory is back for followup. Unfortunately, she does have recurrence of her endometrial cancer. This was found by biopsy. She currently is undergoing radiation therapy. She has 50.4 Gy plan further treatment. So far, she's done well with this.  Otherwise, she is doing well. There's been no problems with bleeding. She's had no fever. She was hospitalized back in December for pneumonia. That was her only episode of pneumonia. We are watching her IgG levels. She did get IVIG when she was hospitalized.   She's had good appetite. She's had no cough. She's had no leg swelling. She's had no bone pain.  We did check her lites chain level today. Her lambda light chain was 1.17 mg/dL. This is holding steady.  Overall, her performance status is ECOG 1.  Medications: Allergies:  Allergies  Allergen Reactions  . Codeine Nausea Only    Past Medical History, Surgical history, Social history, and Family History were reviewed and updated.  Review of Systems: As above  Physical Exam:  height is 5\' 4"  (1.626 m) and weight is 188 lb (85.276 kg). Her oral temperature is 97.9 F (36.6 C). Her blood pressure is 128/73 and her pulse is 87. Her respiration is 16.   Well-developed and well-nourished [name]. Head and neck exam shows no ocular or oral lesion. There are no palpable cervical or supraclavicular lymph nodes. Lungs are clear. Cardiac exam regular rate and rhythm with no murmurs rubs or bruits. Abdomen is soft. She has good  bowel sounds. There is no fluid wave. There is no palpable liver or spleen tip. Extremities shows no clubbing, cyanosis or edema. Neurological exam shows no focal neurological deficits. Skin exam no rashes, ecchymosis or petechia.  Lab Results  Component Value Date   WBC 4.2 03/17/2015   HGB 11.4* 03/17/2015   HCT 34.2* 03/17/2015   MCV 82 03/17/2015   PLT 255 03/17/2015     Chemistry      Component Value Date/Time   NA 141 03/17/2015 1018   NA 137 02/15/2015 1344   NA 139 01/01/2015 0449   K 3.2* 03/17/2015 1018   K 3.2* 02/15/2015 1344   K 3.8 01/01/2015 0449   CL 103 02/15/2015 1344   CL 106 01/01/2015 0449   CO2 25 03/17/2015 1018   CO2 26 02/15/2015 1344   CO2 26 01/01/2015 0449   BUN 21.3 03/17/2015 1018   BUN 12 02/15/2015 1344   BUN 15 01/01/2015 0449   CREATININE 1.0 03/17/2015 1018   CREATININE 1.2 02/15/2015 1344   CREATININE 0.99 01/01/2015 0449      Component Value Date/Time   CALCIUM 9.6 03/17/2015 1018   CALCIUM 9.6 02/15/2015 1344   CALCIUM 8.9 01/01/2015 0449   ALKPHOS 47 03/17/2015 1018   ALKPHOS 45 02/15/2015 1344   ALKPHOS 37* 12/29/2014 0011   AST 17 03/17/2015 1018   AST 21 02/15/2015 1344   AST 26 12/29/2014 0011   ALT 14 03/17/2015 1018   ALT 19 02/15/2015 1344   ALT 21 12/29/2014 0011   BILITOT 0.54  03/17/2015 1018   BILITOT 0.60 02/15/2015 1344   BILITOT 0.5 12/29/2014 0011         Impression and Plan: Ms. Nostrant is 64 year old female. She has recurrent lambda light chain myeloma.  I am sorry that she has recurrence of the endometrial cancer. I'm sure that the radiation will take care of this.  We will go ahead and give her the Velcade today. I think she's done well with this. I don't see pounds with her taking Velcade along with the radiation.  We will plan to get her back in another month.  I don't think she is due for Aredia until April.   I spent about 30 minutes with her and her husband  today   . [name],  MD 2/16/20175:21 PM

## 2015-03-19 ENCOUNTER — Ambulatory Visit
Admission: RE | Admit: 2015-03-19 | Discharge: 2015-03-19 | Disposition: A | Discharge status: Home / Self Care | Payer: BC Managed Care – PPO | Source: Ambulatory Visit | Attending: Radiation Oncology | Admitting: Radiation Oncology

## 2015-03-19 DIAGNOSIS — Z510 Encounter for antineoplastic radiation therapy: Secondary | ICD-10-CM | POA: Diagnosis not present

## 2015-03-19 LAB — PROTEIN ELECTROPHORESIS, SERUM, WITH REFLEX
A/G Ratio: 1.4 (ref 0.7–1.7)
Albumin: 3.9 g/dL (ref 2.9–4.4)
Alpha 1: 0.2 g/dL (ref 0.0–0.4)
Alpha 2: 0.7 g/dL (ref 0.4–1.0)
Beta: 1.1 g/dL (ref 0.7–1.3)
Gamma Globulin: 0.8 g/dL (ref 0.4–1.8)
Globulin, Total: 2.8 g/dL (ref 2.2–3.9)
Total Protein: 6.7 g/dL (ref 6.0–8.5)

## 2015-03-22 ENCOUNTER — Ambulatory Visit
Admission: RE | Admit: 2015-03-22 | Discharge: 2015-03-22 | Disposition: A | Discharge status: Home / Self Care | Payer: BC Managed Care – PPO | Source: Ambulatory Visit | Attending: Radiation Oncology | Admitting: Radiation Oncology

## 2015-03-22 DIAGNOSIS — Z510 Encounter for antineoplastic radiation therapy: Secondary | ICD-10-CM | POA: Diagnosis not present

## 2015-03-23 ENCOUNTER — Ambulatory Visit
Admission: RE | Admit: 2015-03-23 | Discharge: 2015-03-23 | Disposition: A | Discharge status: Home / Self Care | Payer: BC Managed Care – PPO | Source: Ambulatory Visit | Attending: Radiation Oncology | Admitting: Radiation Oncology

## 2015-03-23 ENCOUNTER — Encounter: Payer: Self-pay | Admitting: Radiation Oncology

## 2015-03-23 VITALS — BP 120/77 | HR 78 | Temp 98.3°F | Resp 20 | Wt 189.5 lb

## 2015-03-23 DIAGNOSIS — C541 Malignant neoplasm of endometrium: Secondary | ICD-10-CM

## 2015-03-23 DIAGNOSIS — C7982 Secondary malignant neoplasm of genital organs: Principal | ICD-10-CM

## 2015-03-23 DIAGNOSIS — Z510 Encounter for antineoplastic radiation therapy: Secondary | ICD-10-CM | POA: Diagnosis not present

## 2015-03-23 NOTE — Progress Notes (Signed)
Weekly rad txs endometrial/pelvis 10/28 completed,  Small bout diarrhea yesterday but after eating tomatoes, cukcumbers,  Took 1 imodium and has resolved, no nausea, energy level good, appetite great 1:57 PM BP 120/77 mmHg  Pulse 78  Temp(Src) 98.3 F (36.8 C) (Oral)  Resp 20  Wt 189 lb 8 oz (85.957 kg)  Wt Readings from Last 3 Encounters:  03/23/15 189 lb 8 oz (85.957 kg)  03/17/15 188 lb (85.276 kg)  03/16/15 189 lb 6.4 oz (85.911 kg)

## 2015-03-23 NOTE — Progress Notes (Signed)
  Radiation Oncology         [phone] ________________________________  Name: Emily Greer MRN: [id number]  Date: 03/23/2015  DOB: 04-15-1950  Weekly Radiation Therapy Management    ICD-9-CM ICD-10-CM   1. 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) 198.82 C79.82    182.0 C54.1     Current Dose: 18.0 Gy     Planned Dose:  50.4 Gy  Narrative . . . . . . . . The patient presents for routine under treatment assessment. Weekly rad txs endometrial/pelvis 10/28 completed, She had a small bout of diarrhea yesterday, but reports it was due to what she ate (tomatoes, cucumbers, etc). She took 1 imodium and the diarrhea has resolved. Denies nausea. Has a good energy level and a great appetite. She has no complaints at this time.                                 Set-up films were reviewed.                                 The chart was checked. Physical Findings. . .  weight is 189 lb 8 oz (85.957 kg). Her oral temperature is 98.3 F (36.8 C). Her blood pressure is 120/77 and her pulse is 78. Her respiration is 20.  The lungs are clear. The heart has a regular rhythm and rate. The abdomen is soft and nontender with normal bowel sounds. Impression . . . . . . . The patient is tolerating radiation. Plan . . . . . . . . . . . . Continue treatment as planned. ________________________________   Blair Promise, PhD, MD  This document serves as a record of services personally performed by Gery Pray, MD. It was created on his behalf by Darcus Austin, a trained medical scribe. The creation of this record is based on the scribe's personal observations and the provider's statements to them. This document has been checked and approved by the attending provider.

## 2015-03-24 ENCOUNTER — Ambulatory Visit
Admission: RE | Admit: 2015-03-24 | Discharge: 2015-03-24 | Disposition: A | Discharge status: Home / Self Care | Payer: BC Managed Care – PPO | Source: Ambulatory Visit | Attending: Radiation Oncology | Admitting: Radiation Oncology

## 2015-03-24 DIAGNOSIS — Z510 Encounter for antineoplastic radiation therapy: Secondary | ICD-10-CM | POA: Diagnosis not present

## 2015-03-25 ENCOUNTER — Ambulatory Visit
Admission: RE | Admit: 2015-03-25 | Discharge: 2015-03-25 | Disposition: A | Discharge status: Home / Self Care | Payer: BC Managed Care – PPO | Source: Ambulatory Visit | Attending: Radiation Oncology | Admitting: Radiation Oncology

## 2015-03-25 DIAGNOSIS — Z510 Encounter for antineoplastic radiation therapy: Secondary | ICD-10-CM | POA: Diagnosis not present

## 2015-03-26 ENCOUNTER — Ambulatory Visit
Admission: RE | Admit: 2015-03-26 | Discharge: 2015-03-26 | Disposition: A | Discharge status: Home / Self Care | Payer: BC Managed Care – PPO | Source: Ambulatory Visit | Attending: Radiation Oncology | Admitting: Radiation Oncology

## 2015-03-26 DIAGNOSIS — Z510 Encounter for antineoplastic radiation therapy: Secondary | ICD-10-CM | POA: Diagnosis not present

## 2015-03-29 ENCOUNTER — Ambulatory Visit
Admission: RE | Admit: 2015-03-29 | Discharge: 2015-03-29 | Disposition: A | Discharge status: Home / Self Care | Payer: BC Managed Care – PPO | Source: Ambulatory Visit | Attending: Radiation Oncology | Admitting: Radiation Oncology

## 2015-03-29 DIAGNOSIS — Z510 Encounter for antineoplastic radiation therapy: Secondary | ICD-10-CM | POA: Diagnosis not present

## 2015-03-30 ENCOUNTER — Ambulatory Visit
Admission: RE | Admit: 2015-03-30 | Discharge: 2015-03-30 | Disposition: A | Discharge status: Home / Self Care | Payer: BC Managed Care – PPO | Source: Ambulatory Visit | Attending: Radiation Oncology | Admitting: Radiation Oncology

## 2015-03-30 ENCOUNTER — Encounter: Payer: Self-pay | Admitting: Radiation Oncology

## 2015-03-30 VITALS — BP 125/76 | HR 95 | Temp 98.6°F | Resp 16 | Ht 64.0 in | Wt 191.5 lb

## 2015-03-30 DIAGNOSIS — Z510 Encounter for antineoplastic radiation therapy: Secondary | ICD-10-CM | POA: Diagnosis not present

## 2015-03-30 DIAGNOSIS — C7982 Secondary malignant neoplasm of genital organs: Principal | ICD-10-CM

## 2015-03-30 DIAGNOSIS — C541 Malignant neoplasm of endometrium: Secondary | ICD-10-CM

## 2015-03-30 NOTE — Progress Notes (Addendum)
Emily Greer has completed 15 fractions to her pelvis.  She denies having pain.  She reports having urinary frequency.  She continues to have occasional dysuria.  She denies having hematuria.  She denies having diarrhea, vaginal/rectal bleeding or discharge, nausea and fatigue.    BP 125/76 mmHg  Pulse 95  Temp(Src) 98.6 F (37 C) (Oral)  Resp 16  Ht 5\' 4"  (1.626 m)  Wt 191 lb 8 oz (86.864 kg)  BMI 32.85 kg/m2   Wt Readings from Last 3 Encounters:  03/30/15 191 lb 8 oz (86.864 kg)  03/23/15 189 lb 8 oz (85.957 kg)  03/17/15 188 lb (85.276 kg)

## 2015-03-30 NOTE — Progress Notes (Signed)
  Radiation Oncology         [phone] ________________________________  Name: Emily Greer MRN: [id number]  Date: 03/30/2015  DOB: 1951/01/05  Weekly Radiation Therapy Management    ICD-9-CM ICD-10-CM   1. 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 . . . . . . . The patient presents for routine under treatment assessment.                                 Emily Greer has completed 15 fractions to her pelvis. She denies having pain. She reports having urinary frequency. She continues to have occasional dysuria. She denies having hematuria. She denies having diarrhea, vaginal/rectal bleeding or discharge, nausea and fatigue.                                    Set-up films were reviewed.                                 The chart was checked. Physical Findings. . .  height is 5\' 4"  (1.626 m) and weight is 191 lb 8 oz (86.864 kg). Her oral temperature is 98.6 F (37 C). Her blood pressure is 125/76 and her pulse is 95. Her respiration is 16. . Weight essentially stable.  No significant changes. The lungs are clear. The heart has a regular rhythm and rate. The abdomen is soft and nontender with normal bowel sounds. Impression . . . . . . . The patient is tolerating radiation. Plan . . . . . . . . . . . . Continue treatment as planned.  ________________________________   Blair Promise, PhD, MD

## 2015-03-31 ENCOUNTER — Ambulatory Visit: Payer: BC Managed Care – PPO

## 2015-03-31 ENCOUNTER — Other Ambulatory Visit (HOSPITAL_BASED_OUTPATIENT_CLINIC_OR_DEPARTMENT_OTHER): Payer: BC Managed Care – PPO

## 2015-03-31 ENCOUNTER — Ambulatory Visit
Admission: RE | Admit: 2015-03-31 | Discharge: 2015-03-31 | Disposition: A | Discharge status: Home / Self Care | Payer: BC Managed Care – PPO | Source: Ambulatory Visit | Attending: Radiation Oncology | Admitting: Radiation Oncology

## 2015-03-31 ENCOUNTER — Other Ambulatory Visit: Payer: BC Managed Care – PPO

## 2015-03-31 ENCOUNTER — Ambulatory Visit (HOSPITAL_BASED_OUTPATIENT_CLINIC_OR_DEPARTMENT_OTHER): Payer: BC Managed Care – PPO

## 2015-03-31 VITALS — BP 121/77 | HR 93 | Temp 98.5°F | Resp 20

## 2015-03-31 DIAGNOSIS — C9000 Multiple myeloma not having achieved remission: Secondary | ICD-10-CM

## 2015-03-31 DIAGNOSIS — Z5112 Encounter for antineoplastic immunotherapy: Secondary | ICD-10-CM

## 2015-03-31 DIAGNOSIS — C9002 Multiple myeloma in relapse: Secondary | ICD-10-CM | POA: Diagnosis not present

## 2015-03-31 DIAGNOSIS — Z510 Encounter for antineoplastic radiation therapy: Secondary | ICD-10-CM | POA: Diagnosis not present

## 2015-03-31 DIAGNOSIS — C7982 Secondary malignant neoplasm of genital organs: Principal | ICD-10-CM

## 2015-03-31 DIAGNOSIS — C541 Malignant neoplasm of endometrium: Secondary | ICD-10-CM

## 2015-03-31 LAB — CBC WITH DIFFERENTIAL (CANCER CENTER ONLY)
BASO#: 0 10*3/uL (ref 0.0–0.2)
BASO%: 0.5 % (ref 0.0–2.0)
EOS%: 2.7 % (ref 0.0–7.0)
Eosinophils Absolute: 0.1 10*3/uL (ref 0.0–0.5)
HCT: 35.7 % (ref 34.8–46.6)
HGB: 12.1 g/dL (ref 11.6–15.9)
LYMPH#: 0.6 10*3/uL — ABNORMAL LOW (ref 0.9–3.3)
LYMPH%: 14.6 % (ref 14.0–48.0)
MCH: 28 pg (ref 26.0–34.0)
MCHC: 33.9 g/dL (ref 32.0–36.0)
MCV: 83 fL (ref 81–101)
MONO#: 0.5 10*3/uL (ref 0.1–0.9)
MONO%: 11.6 % (ref 0.0–13.0)
NEUT#: 3.1 10*3/uL (ref 1.5–6.5)
NEUT%: 70.6 % (ref 39.6–80.0)
Platelets: 190 10*3/uL (ref 145–400)
RBC: 4.32 10*6/uL (ref 3.70–5.32)
RDW: 15.9 % — ABNORMAL HIGH (ref 11.1–15.7)
WBC: 4.4 10*3/uL (ref 3.9–10.0)

## 2015-03-31 LAB — CMP (CANCER CENTER ONLY)
ALT(SGPT): 27 U/L (ref 10–47)
AST: 21 U/L (ref 11–38)
Albumin: 3.8 g/dL (ref 3.3–5.5)
Alkaline Phosphatase: 40 U/L (ref 26–84)
BUN, Bld: 18 mg/dL (ref 7–22)
CO2: 27 mEq/L (ref 18–33)
Calcium: 9.9 mg/dL (ref 8.0–10.3)
Chloride: 104 mEq/L (ref 98–108)
Creat: 1.3 mg/dl — ABNORMAL HIGH (ref 0.6–1.2)
Glucose, Bld: 89 mg/dL (ref 73–118)
Potassium: 3.7 mEq/L (ref 3.3–4.7)
Sodium: 137 mEq/L (ref 128–145)
Total Bilirubin: 0.6 mg/dl (ref 0.20–1.60)
Total Protein: 7.1 g/dL (ref 6.4–8.1)

## 2015-03-31 MED ORDER — ONDANSETRON HCL 8 MG PO TABS
8.0000 mg | ORAL_TABLET | Freq: Once | ORAL | Status: AC
  Administered 2015-03-31: 8 mg via ORAL

## 2015-03-31 MED ORDER — ONDANSETRON HCL 8 MG PO TABS
ORAL_TABLET | ORAL | Status: AC
  Filled 2015-03-31: qty 1

## 2015-03-31 MED ORDER — BORTEZOMIB CHEMO SQ INJECTION 3.5 MG (2.5MG/ML)
1.2000 mg/m2 | Freq: Once | INTRAMUSCULAR | Status: AC
  Administered 2015-03-31: 2.5 mg via SUBCUTANEOUS
  Filled 2015-03-31: qty 2.5

## 2015-03-31 NOTE — Patient Instructions (Signed)
Bortezomib injection What is this medicine? BORTEZOMIB (bor TEZ oh mib) is a medicine that targets proteins in cancer cells and stops the cancer cells from growing. It is used to treat multiple myeloma and mantle-cell lymphoma. This medicine may be used for other purposes; ask your health care provider or pharmacist if you have questions. What should I tell my health care provider before I take this medicine? They need to know if you have any of these conditions: -diabetes -heart disease -irregular heartbeat -liver disease -on hemodialysis -low blood counts, like low white blood cells, platelets, or hemoglobin -peripheral neuropathy -taking medicine for blood pressure -an unusual or allergic reaction to bortezomib, mannitol, boron, other medicines, foods, dyes, or preservatives -pregnant or trying to get pregnant -breast-feeding How should I use this medicine? This medicine is for injection into a vein or for injection under the skin. It is given by a health care professional in a hospital or clinic setting. Talk to your pediatrician regarding the use of this medicine in children. Special care may be needed. Overdosage: If you think you have taken too much of this medicine contact a poison control center or emergency room at once. NOTE: This medicine is only for you. Do not share this medicine with others. What if I miss a dose? It is important not to miss your dose. Call your doctor or health care professional if you are unable to keep an appointment. What may interact with this medicine? This medicine may interact with the following medications: -ketoconazole -rifampin -ritonavir -St. John's Wort This list may not describe all possible interactions. Give your health care provider a list of all the medicines, herbs, non-prescription drugs, or dietary supplements you use. Also tell them if you smoke, drink alcohol, or use illegal drugs. Some items may interact with your medicine. What  should I watch for while using this medicine? Visit your doctor for checks on your progress. This drug may make you feel generally unwell. This is not uncommon, as chemotherapy can affect healthy cells as well as cancer cells. Report any side effects. Continue your course of treatment even though you feel ill unless your doctor tells you to stop. You may get drowsy or dizzy. Do not drive, use machinery, or do anything that needs mental alertness until you know how this medicine affects you. Do not stand or sit up quickly, especially if you are an older patient. This reduces the risk of dizzy or fainting spells. In some cases, you may be given additional medicines to help with side effects. Follow all directions for their use. Call your doctor or health care professional for advice if you get a fever, chills or sore throat, or other symptoms of a cold or flu. Do not treat yourself. This drug decreases your body's ability to fight infections. Try to avoid being around people who are sick. This medicine may increase your risk to bruise or bleed. Call your doctor or health care professional if you notice any unusual bleeding. You may need blood work done while you are taking this medicine. In some patients, this medicine may cause a serious brain infection that may cause death. If you have any problems seeing, thinking, speaking, walking, or standing, tell your doctor right away. If you cannot reach your doctor, urgently seek other source of medical care. Do not become pregnant while taking this medicine. Women should inform their doctor if they wish to become pregnant or think they might be pregnant. There is a potential for serious  side effects to an unborn child. Talk to your health care professional or pharmacist for more information. Do not breast-feed an infant while taking this medicine. Check with your doctor or health care professional if you get an attack of severe diarrhea, nausea and vomiting, or if  you sweat a lot. The loss of too much body fluid can make it dangerous for you to take this medicine. What side effects may I notice from receiving this medicine? Side effects that you should report to your doctor or health care professional as soon as possible: -allergic reactions like skin rash, itching or hives, swelling of the face, lips, or tongue -breathing problems -changes in hearing -changes in vision -fast, irregular heartbeat -feeling faint or lightheaded, falls -pain, tingling, numbness in the hands or feet -right upper belly pain -seizures -swelling of the ankles, feet, hands -unusual bleeding or bruising -unusually weak or tired -vomiting -yellowing of the eyes or skin Side effects that usually do not require medical attention (report to your doctor or health care professional if they continue or are bothersome): -changes in emotions or moods -constipation -diarrhea -loss of appetite -headache -irritation at site where injected -nausea This list may not describe all possible side effects. Call your doctor for medical advice about side effects. You may report side effects to FDA at 1-800-FDA-1088. Where should I keep my medicine? This drug is given in a hospital or clinic and will not be stored at home. NOTE: This sheet is a summary. It may not cover all possible information. If you have questions about this medicine, talk to your doctor, pharmacist, or health care provider.    2016, Elsevier/Gold Standard. (2014-03-17 14:47:04)

## 2015-04-01 ENCOUNTER — Ambulatory Visit
Admission: RE | Admit: 2015-04-01 | Discharge: 2015-04-01 | Disposition: A | Discharge status: Home / Self Care | Payer: BC Managed Care – PPO | Source: Ambulatory Visit | Attending: Radiation Oncology | Admitting: Radiation Oncology

## 2015-04-01 DIAGNOSIS — Z510 Encounter for antineoplastic radiation therapy: Secondary | ICD-10-CM | POA: Diagnosis not present

## 2015-04-02 ENCOUNTER — Ambulatory Visit
Admission: RE | Admit: 2015-04-02 | Discharge: 2015-04-02 | Disposition: A | Discharge status: Home / Self Care | Payer: BC Managed Care – PPO | Source: Ambulatory Visit | Attending: Radiation Oncology | Admitting: Radiation Oncology

## 2015-04-02 DIAGNOSIS — Z510 Encounter for antineoplastic radiation therapy: Secondary | ICD-10-CM | POA: Diagnosis not present

## 2015-04-05 ENCOUNTER — Ambulatory Visit
Admission: RE | Admit: 2015-04-05 | Discharge: 2015-04-05 | Disposition: A | Discharge status: Home / Self Care | Payer: BC Managed Care – PPO | Source: Ambulatory Visit | Attending: Radiation Oncology | Admitting: Radiation Oncology

## 2015-04-05 DIAGNOSIS — Z510 Encounter for antineoplastic radiation therapy: Secondary | ICD-10-CM | POA: Diagnosis not present

## 2015-04-06 ENCOUNTER — Encounter: Payer: Self-pay | Admitting: Radiation Oncology

## 2015-04-06 ENCOUNTER — Ambulatory Visit
Admission: RE | Admit: 2015-04-06 | Discharge: 2015-04-06 | Disposition: A | Discharge status: Home / Self Care | Payer: BC Managed Care – PPO | Source: Ambulatory Visit | Attending: Radiation Oncology | Admitting: Radiation Oncology

## 2015-04-06 VITALS — BP 128/77 | HR 83 | Temp 98.2°F | Resp 16 | Ht 64.0 in | Wt 192.8 lb

## 2015-04-06 DIAGNOSIS — C541 Malignant neoplasm of endometrium: Secondary | ICD-10-CM

## 2015-04-06 DIAGNOSIS — Z510 Encounter for antineoplastic radiation therapy: Secondary | ICD-10-CM | POA: Diagnosis not present

## 2015-04-06 DIAGNOSIS — C7982 Secondary malignant neoplasm of genital organs: Principal | ICD-10-CM

## 2015-04-06 NOTE — Progress Notes (Signed)
Emily Greer has completed 20 fractions to her pelvis.  She denies pain.  She reports having more urinary frequency.  She reports burning occasionally when she starts to urinate.  She reports having diarrhea in the morning.  She takes Imodium as needed. She denies having any vaginal or rectal bleeding.  She denies having fatigue or skin irritation.  She will need to miss the next two days because of a family emergency.  BP 128/77 mmHg  Pulse 83  Temp(Src) 98.2 F (36.8 C) (Oral)  Resp 16  Ht 5\' 4"  (1.626 m)  Wt 192 lb 12.8 oz (87.454 kg)  BMI 33.08 kg/m2   Wt Readings from Last 3 Encounters:  04/06/15 192 lb 12.8 oz (87.454 kg)  03/30/15 191 lb 8 oz (86.864 kg)  03/23/15 189 lb 8 oz (85.957 kg)

## 2015-04-06 NOTE — Progress Notes (Signed)
  Radiation Oncology         [phone] ________________________________  Name: Emily Greer MRN: [id number]  Date: 04/06/2015  DOB: 27-Jun-1950  Weekly Radiation Therapy Management    ICD-9-CM ICD-10-CM   1. 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 . . . . . . . The patient presents for routine under treatment assessment.                                   She denies pain. She reports having more urinary frequency. She reports burning occasionally when she starts to urinate. She reports having diarrhea in the morning. She takes Imodium as needed. She denies having any vaginal or rectal bleeding. She denies having fatigue or skin irritation.                                 Set-up films were reviewed.                                 The chart was checked. Physical Findings. . .  height is 5\' 4"  (1.626 m) and weight is 192 lb 12.8 oz (87.454 kg). Her oral temperature is 98.2 F (36.8 C). Her blood pressure is 128/77 and her pulse is 83. Her respiration is 16. . Weight essentially stable.  No significant changes. The lungs are clear. The heart has a regular rhythm and rate. The abdomen is soft and nontender with normal bowel sounds. Impression . . . . . . . The patient is tolerating radiation. Plan . . . . . . . . . . . . Continue treatment as planned.  ________________________________   Blair Promise, PhD, MD

## 2015-04-07 ENCOUNTER — Ambulatory Visit: Payer: BC Managed Care – PPO

## 2015-04-07 ENCOUNTER — Ambulatory Visit
Admission: RE | Admit: 2015-04-07 | Discharge: 2015-04-07 | Disposition: A | Discharge status: Home / Self Care | Payer: BC Managed Care – PPO | Source: Ambulatory Visit | Attending: Radiation Oncology | Admitting: Radiation Oncology

## 2015-04-08 ENCOUNTER — Ambulatory Visit
Admission: RE | Admit: 2015-04-08 | Discharge: 2015-04-08 | Disposition: A | Discharge status: Home / Self Care | Payer: BC Managed Care – PPO | Source: Ambulatory Visit | Attending: Radiation Oncology | Admitting: Radiation Oncology

## 2015-04-08 ENCOUNTER — Ambulatory Visit: Payer: BC Managed Care – PPO

## 2015-04-09 ENCOUNTER — Ambulatory Visit
Admission: RE | Admit: 2015-04-09 | Discharge: 2015-04-09 | Disposition: A | Discharge status: Home / Self Care | Payer: BC Managed Care – PPO | Source: Ambulatory Visit | Attending: Radiation Oncology | Admitting: Radiation Oncology

## 2015-04-09 ENCOUNTER — Other Ambulatory Visit: Payer: Self-pay | Admitting: Hematology & Oncology

## 2015-04-09 DIAGNOSIS — Z510 Encounter for antineoplastic radiation therapy: Secondary | ICD-10-CM | POA: Diagnosis not present

## 2015-04-12 ENCOUNTER — Ambulatory Visit
Admission: RE | Admit: 2015-04-12 | Discharge: 2015-04-12 | Disposition: A | Discharge status: Home / Self Care | Payer: BC Managed Care – PPO | Source: Ambulatory Visit | Attending: Radiation Oncology | Admitting: Radiation Oncology

## 2015-04-12 DIAGNOSIS — Z510 Encounter for antineoplastic radiation therapy: Secondary | ICD-10-CM | POA: Diagnosis not present

## 2015-04-13 ENCOUNTER — Ambulatory Visit
Admission: RE | Admit: 2015-04-13 | Discharge: 2015-04-13 | Disposition: A | Discharge status: Home / Self Care | Payer: BC Managed Care – PPO | Source: Ambulatory Visit | Attending: Radiation Oncology | Admitting: Radiation Oncology

## 2015-04-13 ENCOUNTER — Encounter: Payer: Self-pay | Admitting: Radiation Oncology

## 2015-04-13 VITALS — BP 130/75 | HR 77 | Temp 98.1°F | Ht 64.0 in | Wt 195.1 lb

## 2015-04-13 DIAGNOSIS — C541 Malignant neoplasm of endometrium: Secondary | ICD-10-CM

## 2015-04-13 DIAGNOSIS — Z510 Encounter for antineoplastic radiation therapy: Secondary | ICD-10-CM | POA: Diagnosis not present

## 2015-04-13 DIAGNOSIS — C7982 Secondary malignant neoplasm of genital organs: Principal | ICD-10-CM

## 2015-04-13 NOTE — Progress Notes (Signed)
  Radiation Oncology         [phone] ________________________________  Name: KAITYLYN MUGAVERO MRN: [id number]  Date: 04/13/2015  DOB: April 29, 1950  Weekly Radiation Therapy Management    ICD-9-CM ICD-10-CM   1. 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 . . . . . . . The patient presents for routine under treatment assessment.                    She denies any itching or discomfort within the vaginal area. She denies any bleeding. She has intermittent dysuria. She has occasional loose stool controlled well with Imodium                                 Set-up films were reviewed.                                 The chart was checked. Physical Findings. . .  height is 5\' 4"  (1.626 m) and weight is 195 lb 1.6 oz (88.497 kg). Her temperature is 98.1 F (36.7 C). Her blood pressure is 130/75 and her pulse is 77. . The lungs are clear. The heart has a regular rhythm and rate. The abdomen is soft and nontender with normal bowel sounds. Impression . . . . . . . The patient is tolerating radiation. Plan . . . . . . . . . . . . Continue treatment as planned. ________________________________   Blair Promise, PhD, MD

## 2015-04-13 NOTE — Progress Notes (Signed)
Ms. Mosko is here for her 23rd fraction of radiation to her Pelvis. She denies pain, but reports some fatigue from recent trips to [place]. She reports some diarrhea in the morning or at night, she is taking immodium when she has a loose stool. She reports some occasional burning with urination. She report she is eating well.   BP 130/75 mmHg  Pulse 77  Temp(Src) 98.1 F (36.7 C)  Ht 5\' 4"  (1.626 m)  Wt 195 lb 1.6 oz (88.497 kg)  BMI 33.47 kg/m2   Wt Readings from Last 3 Encounters:  04/13/15 195 lb 1.6 oz (88.497 kg)  04/06/15 192 lb 12.8 oz (87.454 kg)  03/30/15 191 lb 8 oz (86.864 kg)

## 2015-04-14 ENCOUNTER — Other Ambulatory Visit: Payer: BC Managed Care – PPO

## 2015-04-14 ENCOUNTER — Ambulatory Visit
Admission: RE | Admit: 2015-04-14 | Discharge: 2015-04-14 | Disposition: A | Discharge status: Home / Self Care | Payer: BC Managed Care – PPO | Source: Ambulatory Visit | Attending: Radiation Oncology | Admitting: Radiation Oncology

## 2015-04-14 ENCOUNTER — Ambulatory Visit: Payer: BC Managed Care – PPO

## 2015-04-14 ENCOUNTER — Ambulatory Visit: Payer: BC Managed Care – PPO | Admitting: Family

## 2015-04-14 DIAGNOSIS — Z510 Encounter for antineoplastic radiation therapy: Secondary | ICD-10-CM | POA: Diagnosis not present

## 2015-04-15 ENCOUNTER — Ambulatory Visit (HOSPITAL_BASED_OUTPATIENT_CLINIC_OR_DEPARTMENT_OTHER): Payer: BC Managed Care – PPO

## 2015-04-15 ENCOUNTER — Other Ambulatory Visit: Payer: Self-pay | Admitting: *Deleted

## 2015-04-15 ENCOUNTER — Encounter: Payer: Self-pay | Admitting: Family

## 2015-04-15 ENCOUNTER — Other Ambulatory Visit (HOSPITAL_BASED_OUTPATIENT_CLINIC_OR_DEPARTMENT_OTHER): Payer: BC Managed Care – PPO

## 2015-04-15 ENCOUNTER — Ambulatory Visit
Admission: RE | Admit: 2015-04-15 | Discharge: 2015-04-15 | Disposition: A | Discharge status: Home / Self Care | Payer: BC Managed Care – PPO | Source: Ambulatory Visit | Attending: Radiation Oncology | Admitting: Radiation Oncology

## 2015-04-15 ENCOUNTER — Ambulatory Visit (HOSPITAL_BASED_OUTPATIENT_CLINIC_OR_DEPARTMENT_OTHER): Payer: BC Managed Care – PPO | Admitting: Family

## 2015-04-15 VITALS — BP 111/71 | HR 79 | Temp 98.4°F | Resp 16 | Ht 64.0 in | Wt 193.0 lb

## 2015-04-15 DIAGNOSIS — C541 Malignant neoplasm of endometrium: Secondary | ICD-10-CM | POA: Diagnosis not present

## 2015-04-15 DIAGNOSIS — Z5112 Encounter for antineoplastic immunotherapy: Secondary | ICD-10-CM

## 2015-04-15 DIAGNOSIS — C9001 Multiple myeloma in remission: Secondary | ICD-10-CM

## 2015-04-15 DIAGNOSIS — Z510 Encounter for antineoplastic radiation therapy: Secondary | ICD-10-CM | POA: Diagnosis not present

## 2015-04-15 DIAGNOSIS — C9000 Multiple myeloma not having achieved remission: Secondary | ICD-10-CM

## 2015-04-15 DIAGNOSIS — C9002 Multiple myeloma in relapse: Secondary | ICD-10-CM

## 2015-04-15 DIAGNOSIS — C7982 Secondary malignant neoplasm of genital organs: Secondary | ICD-10-CM

## 2015-04-15 DIAGNOSIS — I10 Essential (primary) hypertension: Secondary | ICD-10-CM

## 2015-04-15 LAB — CBC WITH DIFFERENTIAL (CANCER CENTER ONLY)
BASO#: 0 10*3/uL (ref 0.0–0.2)
BASO%: 1.1 % (ref 0.0–2.0)
EOS%: 1.9 % (ref 0.0–7.0)
Eosinophils Absolute: 0.1 10*3/uL (ref 0.0–0.5)
HCT: 32.8 % — ABNORMAL LOW (ref 34.8–46.6)
HGB: 11 g/dL — ABNORMAL LOW (ref 11.6–15.9)
LYMPH#: 0.7 10*3/uL — ABNORMAL LOW (ref 0.9–3.3)
LYMPH%: 19.1 % (ref 14.0–48.0)
MCH: 28 pg (ref 26.0–34.0)
MCHC: 33.5 g/dL (ref 32.0–36.0)
MCV: 84 fL (ref 81–101)
MONO#: 0.7 10*3/uL (ref 0.1–0.9)
MONO%: 18.2 % — ABNORMAL HIGH (ref 0.0–13.0)
NEUT#: 2.2 10*3/uL (ref 1.5–6.5)
NEUT%: 59.7 % (ref 39.6–80.0)
Platelets: 159 10*3/uL (ref 145–400)
RBC: 3.93 10*6/uL (ref 3.70–5.32)
RDW: 15.6 % (ref 11.1–15.7)
WBC: 3.6 10*3/uL — ABNORMAL LOW (ref 3.9–10.0)

## 2015-04-15 LAB — CMP (CANCER CENTER ONLY)
ALT(SGPT): 24 U/L (ref 10–47)
AST: 22 U/L (ref 11–38)
Albumin: 3.5 g/dL (ref 3.3–5.5)
Alkaline Phosphatase: 41 U/L (ref 26–84)
BUN, Bld: 15 mg/dL (ref 7–22)
CO2: 29 mEq/L (ref 18–33)
Calcium: 9.5 mg/dL (ref 8.0–10.3)
Chloride: 106 mEq/L (ref 98–108)
Creat: 1.2 mg/dl (ref 0.6–1.2)
Glucose, Bld: 96 mg/dL (ref 73–118)
Potassium: 3.7 mEq/L (ref 3.3–4.7)
Sodium: 140 mEq/L (ref 128–145)
Total Bilirubin: 0.6 mg/dl (ref 0.20–1.60)
Total Protein: 6.7 g/dL (ref 6.4–8.1)

## 2015-04-15 LAB — LACTATE DEHYDROGENASE: LDH: 179 U/L (ref 125–245)

## 2015-04-15 MED ORDER — ONDANSETRON HCL 8 MG PO TABS
ORAL_TABLET | ORAL | Status: AC
  Filled 2015-04-15: qty 1

## 2015-04-15 MED ORDER — BORTEZOMIB CHEMO SQ INJECTION 3.5 MG (2.5MG/ML)
1.2000 mg/m2 | Freq: Once | INTRAMUSCULAR | Status: AC
  Administered 2015-04-15: 2.5 mg via SUBCUTANEOUS
  Filled 2015-04-15: qty 2.5

## 2015-04-15 MED ORDER — POTASSIUM CHLORIDE CRYS ER 20 MEQ PO TBCR: 20.0000 meq | EXTENDED_RELEASE_TABLET | Freq: Every day | ORAL | Status: DC

## 2015-04-15 MED ORDER — ONDANSETRON HCL 8 MG PO TABS
8.0000 mg | ORAL_TABLET | Freq: Once | ORAL | Status: AC
  Administered 2015-04-15: 8 mg via ORAL

## 2015-04-15 MED ORDER — POMALIDOMIDE 1 MG PO CAPS: ORAL_CAPSULE | ORAL | Status: DC

## 2015-04-15 NOTE — Patient Instructions (Signed)
[facility] Discharge Instructions for Patients Receiving Chemotherapy  Today you received the following chemotherapy agents Velcade  To help prevent nausea and vomiting after your treatment, we encourage you to take your nausea medication    If you develop nausea and vomiting that is not controlled by your nausea medication, call the clinic.   BELOW ARE SYMPTOMS THAT SHOULD BE REPORTED IMMEDIATELY:  *FEVER GREATER THAN 100.5 F  *CHILLS WITH OR WITHOUT FEVER  NAUSEA AND VOMITING THAT IS NOT CONTROLLED WITH YOUR NAUSEA MEDICATION  *UNUSUAL SHORTNESS OF BREATH  *UNUSUAL BRUISING OR BLEEDING  TENDERNESS IN MOUTH AND THROAT WITH OR WITHOUT PRESENCE OF ULCERS  *URINARY PROBLEMS  *BOWEL PROBLEMS  UNUSUAL RASH Items with * indicate a potential emergency and should be followed up as soon as possible.  Feel free to call the clinic you have any questions or concerns. The clinic phone number is [phone].  Please show the CHEMO ALERT CARD at check-in to the Emergency Department and triage nurse.   

## 2015-04-15 NOTE — Progress Notes (Signed)
Hematology and Oncology Follow Up Visit  Emily Greer [id number] 07-12-1950 65 y.o. 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 3 month Radiation therapy for endometrial recurrence    Interim History:  Emily Greer is here today with her husband for a follow-up. She is currently receiving radiation for recurrent endometrial cancer at the vaginal apex. She has completed 22 of 25 fractions. She has had some mild fatigue but attributes that to some stress at home.  She has had no problem with radiation burns, rash or itching.  Her myeloma studies in February looked good. No M-spike and lambda light chain level was 1.17 mg/dL. Her IgG level was 844 at that time.  No fever, chills, n/v, cough, dizziness, SOB, chest pain, palpitations, abdominal pain or changes in bowel or bladder habits. Imodium continues to help resolve her diarrhea and she takes as needed.  No lymphadenopathy found on exam. No episodes of bleeding, bruising or petechiae.  No swelling, tenderness, numbness or tingling in her extremities. No c/o joint aches or "bone" pain.  She has maintained a good appetite and is staying well hydrated. Her weight is stable.   Medications:    Medication List       This list is accurate as of: 04/15/15  1:45 PM.  Always use your most recent med list.               acyclovir 400 MG tablet  Commonly known as:  ZOVIRAX  Take 1 tablet (400 mg total) by mouth 2 (two) times daily.     albuterol 108 (90 Base) MCG/ACT inhaler  Commonly known as:  PROVENTIL HFA;VENTOLIN HFA  Inhale 2 puffs into the lungs every 6 (six) hours as needed for wheezing or shortness of breath. 2 puffs 3 times daily x 5 days then every 6 hours as needed.     amLODipine  10 MG tablet  Commonly known as:  NORVASC  TAKE 1 TABLET BY MOUTH DAILY     bortezomib IV 3.5 MG injection  Commonly known as:  VELCADE  Inject 1.3 mg/m2 into the vein once. Patient is taking 2.5 MG injection every other week     loperamide 2 MG capsule  Commonly known as:  IMODIUM  Take by mouth as needed for diarrhea or loose stools.     loratadine 10 MG tablet  Commonly known as:  CLARITIN  Take 10 mg by mouth every morning.     ondansetron 8 MG tablet  Commonly known as:  ZOFRAN  Take 8 mg by mouth every 8 (eight) hours as needed for nausea or vomiting. Reported on 03/04/2015     oxymetazoline 0.05 % nasal spray  Commonly known as:  AFRIN  Place 1 spray into both nostrils 2 (two) times daily. Reported on 01/29/2015     pomalidomide 1 MG capsule  Commonly known as:  POMALYST  TAKE 1 CAPSULE BY MOUTH EVERY DAY FOR 21 DAYS, THEN OFF 7 DAYS. [id number]     potassium chloride SA 20 MEQ tablet  Commonly known as:  K-DUR,KLOR-CON  Take 1 tablet (20 mEq total) by mouth 2 (two) times daily.     triamterene-hydrochlorothiazide 37.5-25 MG tablet  Commonly known as:  MAXZIDE-25  Take 1 tablet by mouth daily.     Vitamin  D3 2000 units Tabs  Take 1 tablet by mouth daily.        Allergies:  Allergies  Allergen Reactions  . Codeine Nausea Only    Past Medical History, Surgical history, Social history, and Family History were reviewed and updated.  Review of Systems: All other 10 point review of systems is negative.   Physical Exam:  height is 5\' 4"  (1.626 m) and weight is 193 lb (87.544 kg). Her oral temperature is 98.4 F (36.9 C). Her blood pressure is 111/71 and her pulse is 79. Her respiration is 16.   Wt Readings from Last 3 Encounters:  04/15/15 193 lb (87.544 kg)  04/13/15 195 lb 1.6 oz (88.497 kg)  04/06/15 192 lb 12.8 oz (87.454 kg)    Ocular: Sclerae unicteric, pupils equal, round and reactive to light Ear-nose-throat: Oropharynx clear, dentition  fair Lymphatic: No cervical supraclavicular or axillary adenopathy Lungs no rales or rhonchi, good excursion bilaterally Heart regular rate and rhythm, no murmur appreciated Abd soft, nontender, positive bowel sounds, no liver or spleen tip palpated on exam, no fluid wave MSK no focal spinal tenderness, no joint edema Neuro: non-focal, well-oriented, appropriate affect Breasts: Deferred  Lab Results  Component Value Date   WBC 3.6* 04/15/2015   HGB 11.0* 04/15/2015   HCT 32.8* 04/15/2015   MCV 84 04/15/2015   PLT 159 04/15/2015   Lab Results  Component Value Date   FERRITIN 227 12/31/2014   IRON 13* 12/31/2014   TIBC 302 12/31/2014   UIBC 289 12/31/2014   IRONPCTSAT 4* 12/31/2014   Lab Results  Component Value Date   RETICCTPCT 0.6 12/31/2014   RBC 3.93 04/15/2015   Lab Results  Component Value Date   KPAFRELGTCHN 1.67 11/23/2014   LAMBDASER 1.85 11/23/2014   KAPLAMBRATIO 0.98 03/17/2015   Lab Results  Component Value Date   IGGSERUM 844 03/17/2015   IGA 91 12/31/2014   IGMSERUM 37 03/17/2015   Lab Results  Component Value Date   TOTALPROTELP 6.4 11/23/2014   ALBUMINELP 4.0 11/23/2014   A1GS 0.3 11/23/2014   A2GS 0.7 11/23/2014   BETS 0.5 11/23/2014   BETA2SER 0.4 11/23/2014   GAMS 0.7* 11/23/2014   MSPIKE Not Observed 03/17/2015   SPEI * 11/23/2014     Chemistry      Component Value Date/Time   NA 137 03/31/2015 1357   NA 141 03/17/2015 1018   NA 139 01/01/2015 0449   K 3.7 03/31/2015 1357   K 3.2* 03/17/2015 1018   K 3.8 01/01/2015 0449   CL 104 03/31/2015 1357   CL 106 01/01/2015 0449   CO2 27 03/31/2015 1357   CO2 25 03/17/2015 1018   CO2 26 01/01/2015 0449   BUN 18 03/31/2015 1357   BUN 21.3 03/17/2015 1018   BUN 15 01/01/2015 0449   CREATININE 1.3* 03/31/2015 1357   CREATININE 1.0 03/17/2015 1018   CREATININE 0.99 01/01/2015 0449      Component Value Date/Time   CALCIUM 9.9 03/31/2015 1357   CALCIUM 9.6 03/17/2015 1018   CALCIUM 8.9  01/01/2015 0449   ALKPHOS 40 03/31/2015 1357   ALKPHOS 47 03/17/2015 1018   ALKPHOS 37* 12/29/2014 0011   AST 21 03/31/2015 1357   AST 17 03/17/2015 1018   AST 26 12/29/2014 0011   ALT 27 03/31/2015 1357   ALT 14 03/17/2015 1018   ALT 21 12/29/2014 0011   BILITOT 0.60 03/31/2015 1357   BILITOT 0.54 03/17/2015 1018   BILITOT 0.5 12/29/2014 0011  Impression and Plan: Ms. Diemert is 65 year old female with recurrent lambda light chain myeloma. Her endometrial carcinoma has recurred again at the vaginal apex. She has tolerated radiation well and has had no skin issues. She has had some mild fatigue that comes and goes.  We will proceed with Velcade today as planned.  She will be due for Aredia in April.  I did move her Pomalidomide prescription to [facility] per her request due to change in insurance requirements.  We will decrease her KDUR to 20 meq daily since her potassium is now up to 3.7.  We will plan to see her back in 1 month for labs and follow-up.  She knows to call here with any questions or concerns. We can certainly see sooner if need be.   Eliezer Bottom, NP 3/16/20171:45 PM

## 2015-04-16 ENCOUNTER — Ambulatory Visit: Payer: BC Managed Care – PPO

## 2015-04-16 ENCOUNTER — Ambulatory Visit
Admission: RE | Admit: 2015-04-16 | Discharge: 2015-04-16 | Disposition: A | Discharge status: Home / Self Care | Payer: BC Managed Care – PPO | Source: Ambulatory Visit | Attending: Radiation Oncology | Admitting: Radiation Oncology

## 2015-04-16 ENCOUNTER — Other Ambulatory Visit: Payer: Self-pay | Admitting: *Deleted

## 2015-04-16 DIAGNOSIS — I10 Essential (primary) hypertension: Secondary | ICD-10-CM

## 2015-04-16 DIAGNOSIS — Z510 Encounter for antineoplastic radiation therapy: Secondary | ICD-10-CM | POA: Diagnosis not present

## 2015-04-16 DIAGNOSIS — C9001 Multiple myeloma in remission: Secondary | ICD-10-CM

## 2015-04-16 DIAGNOSIS — R87810 Cervical high risk human papillomavirus (HPV) DNA test positive: Secondary | ICD-10-CM | POA: Insufficient documentation

## 2015-04-16 DIAGNOSIS — C9000 Multiple myeloma not having achieved remission: Secondary | ICD-10-CM

## 2015-04-16 DIAGNOSIS — R87610 Atypical squamous cells of undetermined significance on cytologic smear of cervix (ASC-US): Secondary | ICD-10-CM | POA: Insufficient documentation

## 2015-04-16 LAB — IGG, IGA, IGM
IgA, Qn, Serum: 125 mg/dL (ref 87–352)
IgG, Qn, Serum: 812 mg/dL (ref 700–1600)
IgM, Qn, Serum: 35 mg/dL (ref 26–217)

## 2015-04-16 LAB — KAPPA/LAMBDA LIGHT CHAINS
Ig Kappa Free Light Chain: 20.28 mg/L — ABNORMAL HIGH (ref 3.30–19.40)
Ig Lambda Free Light Chain: 15.88 mg/L (ref 5.71–26.30)
Kappa/Lambda FluidC Ratio: 1.28 (ref 0.26–1.65)

## 2015-04-16 MED ORDER — POTASSIUM CHLORIDE CRYS ER 20 MEQ PO TBCR: 20.0000 meq | EXTENDED_RELEASE_TABLET | Freq: Two times a day (BID) | ORAL | Status: DC

## 2015-04-19 ENCOUNTER — Ambulatory Visit: Payer: BC Managed Care – PPO

## 2015-04-19 ENCOUNTER — Ambulatory Visit
Admission: RE | Admit: 2015-04-19 | Discharge: 2015-04-19 | Disposition: A | Discharge status: Home / Self Care | Payer: BC Managed Care – PPO | Source: Ambulatory Visit | Attending: Radiation Oncology | Admitting: Radiation Oncology

## 2015-04-19 DIAGNOSIS — Z510 Encounter for antineoplastic radiation therapy: Secondary | ICD-10-CM | POA: Diagnosis not present

## 2015-04-19 LAB — PROTEIN ELECTROPHORESIS, SERUM, WITH REFLEX
A/G Ratio: 1.3 (ref 0.7–1.7)
Albumin: 3.6 g/dL (ref 2.9–4.4)
Alpha 1: 0.2 g/dL (ref 0.0–0.4)
Alpha 2: 0.7 g/dL (ref 0.4–1.0)
Beta: 1 g/dL (ref 0.7–1.3)
Gamma Globulin: 0.8 g/dL (ref 0.4–1.8)
Globulin, Total: 2.7 g/dL (ref 2.2–3.9)
Total Protein: 6.3 g/dL (ref 6.0–8.5)

## 2015-04-20 ENCOUNTER — Ambulatory Visit
Admission: RE | Admit: 2015-04-20 | Discharge: 2015-04-20 | Disposition: A | Discharge status: Home / Self Care | Payer: BC Managed Care – PPO | Source: Ambulatory Visit | Attending: Radiation Oncology | Admitting: Radiation Oncology

## 2015-04-20 ENCOUNTER — Encounter: Payer: Self-pay | Admitting: Radiation Oncology

## 2015-04-20 ENCOUNTER — Ambulatory Visit: Payer: BC Managed Care – PPO

## 2015-04-20 VITALS — BP 134/76 | HR 90 | Temp 98.2°F | Wt 190.7 lb

## 2015-04-20 DIAGNOSIS — C541 Malignant neoplasm of endometrium: Secondary | ICD-10-CM

## 2015-04-20 DIAGNOSIS — C7982 Secondary malignant neoplasm of genital organs: Principal | ICD-10-CM

## 2015-04-20 DIAGNOSIS — Z510 Encounter for antineoplastic radiation therapy: Secondary | ICD-10-CM | POA: Diagnosis not present

## 2015-04-20 NOTE — Progress Notes (Signed)
Emily Greer is here for her last fraction of radiation to her Pelvis. She denies pain. She reports some diarrhea, but reports it is related to her diet not radiation. She has a history of having diarrhea with certain foods, and she drank coffee this morning which at times will cause it. She reports frequent urination but denies burning. She is eating well per her report. She was given a follow up appointment and has no other concerns at this time.   BP 134/76 mmHg  Pulse 90  Temp(Src) 98.2 F (36.8 C)  Wt 190 lb 11.2 oz (86.501 kg)   Wt Readings from Last 3 Encounters:  04/20/15 190 lb 11.2 oz (86.501 kg)  04/15/15 193 lb (87.544 kg)  04/13/15 195 lb 1.6 oz (88.497 kg)

## 2015-04-20 NOTE — Progress Notes (Signed)
  Radiation Oncology         [phone] ________________________________  Name: Emily Greer MRN: [id number]  Date: 04/20/2015  DOB: 28-Sep-1950  Weekly Radiation Therapy Management    ICD-9-CM ICD-10-CM   1. 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 . . . . . . . The patient presents for routine under treatment assessment.                                   Ms. Ceravolo is here for her last fraction of radiation to her Pelvis. She denies pain. She reports some diarrhea, but reports it is related to her diet not radiation. She has a history of having diarrhea with certain foods, and she drank coffee this morning which at times will cause it. She reports frequent urination but denies burning. She is eating well per her report. She was given a follow up appointment and has no other concerns at this time.  No reports of vaginal bleeding or drainage                                 Set-up films were reviewed.                                 The chart was checked. Physical Findings. . .  weight is 190 lb 11.2 oz (86.501 kg). Her temperature is 98.2 F (36.8 C). Her blood pressure is 134/76 and her pulse is 90. . Weight essentially stable.  No significant changes.  Lungs are clear. The heart has a regular rhythm and rate. The abdomen is soft and nontender with normal bowel sounds. Impression . . . . . . . The patient is tolerating radiation. Plan . . . . . . . . . . . . Routine follow-up in one month.  ________________________________   Blair Promise, PhD, MD

## 2015-04-25 NOTE — Progress Notes (Signed)
  Radiation Oncology         [phone] ________________________________  Name: Emily Greer MRN: [id number]  Date: 04/20/2015  DOB: 1950-08-14  End of Treatment Note   ICD-9-CM ICD-10-CM    1. 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   Mild increase in urinary frequency.  Plan: The patient has completed radiation treatment. The patient will return to radiation oncology clinic for routine followup in one month. I advised them to call or return sooner if they have any questions or concerns related to their recovery or treatment.  -----------------------------------  Blair Promise, PhD, MD

## 2015-04-27 ENCOUNTER — Other Ambulatory Visit: Payer: Self-pay | Admitting: *Deleted

## 2015-04-27 DIAGNOSIS — C541 Malignant neoplasm of endometrium: Secondary | ICD-10-CM

## 2015-04-27 DIAGNOSIS — C9002 Multiple myeloma in relapse: Secondary | ICD-10-CM

## 2015-04-28 ENCOUNTER — Ambulatory Visit: Payer: BC Managed Care – PPO

## 2015-04-28 ENCOUNTER — Other Ambulatory Visit: Payer: BC Managed Care – PPO

## 2015-04-29 ENCOUNTER — Ambulatory Visit (HOSPITAL_BASED_OUTPATIENT_CLINIC_OR_DEPARTMENT_OTHER): Payer: BC Managed Care – PPO

## 2015-04-29 ENCOUNTER — Other Ambulatory Visit (HOSPITAL_BASED_OUTPATIENT_CLINIC_OR_DEPARTMENT_OTHER): Payer: BC Managed Care – PPO

## 2015-04-29 VITALS — BP 132/79 | HR 90 | Temp 98.4°F | Resp 16

## 2015-04-29 DIAGNOSIS — C541 Malignant neoplasm of endometrium: Secondary | ICD-10-CM

## 2015-04-29 DIAGNOSIS — Z5112 Encounter for antineoplastic immunotherapy: Secondary | ICD-10-CM | POA: Diagnosis not present

## 2015-04-29 DIAGNOSIS — C9002 Multiple myeloma in relapse: Secondary | ICD-10-CM

## 2015-04-29 DIAGNOSIS — C9000 Multiple myeloma not having achieved remission: Secondary | ICD-10-CM | POA: Diagnosis not present

## 2015-04-29 LAB — CMP (CANCER CENTER ONLY)
ALT(SGPT): 24 U/L (ref 10–47)
AST: 19 U/L (ref 11–38)
Albumin: 3.6 g/dL (ref 3.3–5.5)
Alkaline Phosphatase: 40 U/L (ref 26–84)
BUN, Bld: 17 mg/dL (ref 7–22)
CO2: 26 mEq/L (ref 18–33)
Calcium: 9.2 mg/dL (ref 8.0–10.3)
Chloride: 104 mEq/L (ref 98–108)
Creat: 1.2 mg/dl (ref 0.6–1.2)
Glucose, Bld: 114 mg/dL (ref 73–118)
Potassium: 3.1 mEq/L — ABNORMAL LOW (ref 3.3–4.7)
Sodium: 140 mEq/L (ref 128–145)
Total Bilirubin: 0.6 mg/dl (ref 0.20–1.60)
Total Protein: 7.1 g/dL (ref 6.4–8.1)

## 2015-04-29 LAB — CBC WITH DIFFERENTIAL (CANCER CENTER ONLY)
BASO#: 0.1 10*3/uL (ref 0.0–0.2)
BASO%: 1.5 % (ref 0.0–2.0)
EOS%: 2.7 % (ref 0.0–7.0)
Eosinophils Absolute: 0.1 10*3/uL (ref 0.0–0.5)
HCT: 34.4 % — ABNORMAL LOW (ref 34.8–46.6)
HGB: 11.9 g/dL (ref 11.6–15.9)
LYMPH#: 0.8 10*3/uL — ABNORMAL LOW (ref 0.9–3.3)
LYMPH%: 23.4 % (ref 14.0–48.0)
MCH: 28.4 pg (ref 26.0–34.0)
MCHC: 34.6 g/dL (ref 32.0–36.0)
MCV: 82 fL (ref 81–101)
MONO#: 0.4 10*3/uL (ref 0.1–0.9)
MONO%: 12.7 % (ref 0.0–13.0)
NEUT#: 2 10*3/uL (ref 1.5–6.5)
NEUT%: 59.7 % (ref 39.6–80.0)
Platelets: 218 10*3/uL (ref 145–400)
RBC: 4.19 10*6/uL (ref 3.70–5.32)
RDW: 15.1 % (ref 11.1–15.7)
WBC: 3.4 10*3/uL — ABNORMAL LOW (ref 3.9–10.0)

## 2015-04-29 MED ORDER — ONDANSETRON HCL 8 MG PO TABS
ORAL_TABLET | ORAL | Status: AC
Start: 2015-04-29 — End: 2015-04-29
  Filled 2015-04-29: qty 1

## 2015-04-29 MED ORDER — BORTEZOMIB CHEMO SQ INJECTION 3.5 MG (2.5MG/ML)
1.2000 mg/m2 | Freq: Once | INTRAMUSCULAR | Status: AC
  Administered 2015-04-29: 2.5 mg via SUBCUTANEOUS
  Filled 2015-04-29: qty 2.5

## 2015-04-29 MED ORDER — ONDANSETRON HCL 8 MG PO TABS
8.0000 mg | ORAL_TABLET | Freq: Once | ORAL | Status: AC
  Administered 2015-04-29: 8 mg via ORAL

## 2015-04-29 NOTE — Patient Instructions (Signed)
Bortezomib injection What is this medicine? BORTEZOMIB (bor TEZ oh mib) is a medicine that targets proteins in cancer cells and stops the cancer cells from growing. It is used to treat multiple myeloma and mantle-cell lymphoma. This medicine may be used for other purposes; ask your health care provider or pharmacist if you have questions. What should I tell my health care provider before I take this medicine? They need to know if you have any of these conditions: -diabetes -heart disease -irregular heartbeat -liver disease -on hemodialysis -low blood counts, like low white blood cells, platelets, or hemoglobin -peripheral neuropathy -taking medicine for blood pressure -an unusual or allergic reaction to bortezomib, mannitol, boron, other medicines, foods, dyes, or preservatives -pregnant or trying to get pregnant -breast-feeding How should I use this medicine? This medicine is for injection into a vein or for injection under the skin. It is given by a health care professional in a hospital or clinic setting. Talk to your pediatrician regarding the use of this medicine in children. Special care may be needed. Overdosage: If you think you have taken too much of this medicine contact a poison control center or emergency room at once. NOTE: This medicine is only for you. Do not share this medicine with others. What if I miss a dose? It is important not to miss your dose. Call your doctor or health care professional if you are unable to keep an appointment. What may interact with this medicine? This medicine may interact with the following medications: -ketoconazole -rifampin -ritonavir -St. John's Wort This list may not describe all possible interactions. Give your health care provider a list of all the medicines, herbs, non-prescription drugs, or dietary supplements you use. Also tell them if you smoke, drink alcohol, or use illegal drugs. Some items may interact with your medicine. What  should I watch for while using this medicine? Visit your doctor for checks on your progress. This drug may make you feel generally unwell. This is not uncommon, as chemotherapy can affect healthy cells as well as cancer cells. Report any side effects. Continue your course of treatment even though you feel ill unless your doctor tells you to stop. You may get drowsy or dizzy. Do not drive, use machinery, or do anything that needs mental alertness until you know how this medicine affects you. Do not stand or sit up quickly, especially if you are an older patient. This reduces the risk of dizzy or fainting spells. In some cases, you may be given additional medicines to help with side effects. Follow all directions for their use. Call your doctor or health care professional for advice if you get a fever, chills or sore throat, or other symptoms of a cold or flu. Do not treat yourself. This drug decreases your body's ability to fight infections. Try to avoid being around people who are sick. This medicine may increase your risk to bruise or bleed. Call your doctor or health care professional if you notice any unusual bleeding. You may need blood work done while you are taking this medicine. In some patients, this medicine may cause a serious brain infection that may cause death. If you have any problems seeing, thinking, speaking, walking, or standing, tell your doctor right away. If you cannot reach your doctor, urgently seek other source of medical care. Do not become pregnant while taking this medicine. Women should inform their doctor if they wish to become pregnant or think they might be pregnant. There is a potential for serious  side effects to an unborn child. Talk to your health care professional or pharmacist for more information. Do not breast-feed an infant while taking this medicine. Check with your doctor or health care professional if you get an attack of severe diarrhea, nausea and vomiting, or if  you sweat a lot. The loss of too much body fluid can make it dangerous for you to take this medicine. What side effects may I notice from receiving this medicine? Side effects that you should report to your doctor or health care professional as soon as possible: -allergic reactions like skin rash, itching or hives, swelling of the face, lips, or tongue -breathing problems -changes in hearing -changes in vision -fast, irregular heartbeat -feeling faint or lightheaded, falls -pain, tingling, numbness in the hands or feet -right upper belly pain -seizures -swelling of the ankles, feet, hands -unusual bleeding or bruising -unusually weak or tired -vomiting -yellowing of the eyes or skin Side effects that usually do not require medical attention (report to your doctor or health care professional if they continue or are bothersome): -changes in emotions or moods -constipation -diarrhea -loss of appetite -headache -irritation at site where injected -nausea This list may not describe all possible side effects. Call your doctor for medical advice about side effects. You may report side effects to FDA at 1-800-FDA-1088. Where should I keep my medicine? This drug is given in a hospital or clinic and will not be stored at home. NOTE: This sheet is a summary. It may not cover all possible information. If you have questions about this medicine, talk to your doctor, pharmacist, or health care provider.    2016, Elsevier/Gold Standard. (2014-03-17 14:47:04)

## 2015-05-07 ENCOUNTER — Other Ambulatory Visit: Payer: Self-pay | Admitting: Hematology & Oncology

## 2015-05-10 ENCOUNTER — Other Ambulatory Visit: Payer: Self-pay | Admitting: Nurse Practitioner

## 2015-05-10 MED ORDER — POMALIDOMIDE 1 MG PO CAPS: ORAL_CAPSULE | ORAL | Status: DC

## 2015-05-12 ENCOUNTER — Encounter: Payer: Self-pay | Admitting: Hematology & Oncology

## 2015-05-12 ENCOUNTER — Other Ambulatory Visit (HOSPITAL_BASED_OUTPATIENT_CLINIC_OR_DEPARTMENT_OTHER): Payer: BC Managed Care – PPO

## 2015-05-12 ENCOUNTER — Ambulatory Visit (HOSPITAL_BASED_OUTPATIENT_CLINIC_OR_DEPARTMENT_OTHER): Payer: BC Managed Care – PPO

## 2015-05-12 ENCOUNTER — Ambulatory Visit (HOSPITAL_BASED_OUTPATIENT_CLINIC_OR_DEPARTMENT_OTHER): Payer: BC Managed Care – PPO | Admitting: Hematology & Oncology

## 2015-05-12 VITALS — BP 129/72 | HR 89 | Temp 98.5°F | Resp 16 | Ht 64.0 in | Wt 187.0 lb

## 2015-05-12 DIAGNOSIS — C541 Malignant neoplasm of endometrium: Secondary | ICD-10-CM | POA: Diagnosis not present

## 2015-05-12 DIAGNOSIS — Z8579 Personal history of other malignant neoplasms of lymphoid, hematopoietic and related tissues: Secondary | ICD-10-CM

## 2015-05-12 DIAGNOSIS — C9001 Multiple myeloma in remission: Secondary | ICD-10-CM | POA: Diagnosis not present

## 2015-05-12 DIAGNOSIS — Z5112 Encounter for antineoplastic immunotherapy: Secondary | ICD-10-CM

## 2015-05-12 DIAGNOSIS — C9000 Multiple myeloma not having achieved remission: Secondary | ICD-10-CM

## 2015-05-12 DIAGNOSIS — I10 Essential (primary) hypertension: Secondary | ICD-10-CM

## 2015-05-12 LAB — CMP (CANCER CENTER ONLY)
ALT(SGPT): 27 U/L (ref 10–47)
AST: 22 U/L (ref 11–38)
Albumin: 3.6 g/dL (ref 3.3–5.5)
Alkaline Phosphatase: 38 U/L (ref 26–84)
BUN, Bld: 15 mg/dL (ref 7–22)
CO2: 26 mEq/L (ref 18–33)
Calcium: 9.8 mg/dL (ref 8.0–10.3)
Chloride: 103 mEq/L (ref 98–108)
Creat: 1.2 mg/dl (ref 0.6–1.2)
Glucose, Bld: 90 mg/dL (ref 73–118)
Potassium: 3.5 mEq/L (ref 3.3–4.7)
Sodium: 138 mEq/L (ref 128–145)
Total Bilirubin: 0.6 mg/dl (ref 0.20–1.60)
Total Protein: 7 g/dL (ref 6.4–8.1)

## 2015-05-12 LAB — CBC WITH DIFFERENTIAL (CANCER CENTER ONLY)
BASO#: 0 10*3/uL (ref 0.0–0.2)
BASO%: 0.8 % (ref 0.0–2.0)
EOS%: 2.6 % (ref 0.0–7.0)
Eosinophils Absolute: 0.1 10*3/uL (ref 0.0–0.5)
HCT: 34.7 % — ABNORMAL LOW (ref 34.8–46.6)
HGB: 11.8 g/dL (ref 11.6–15.9)
LYMPH#: 0.9 10*3/uL (ref 0.9–3.3)
LYMPH%: 23.7 % (ref 14.0–48.0)
MCH: 28 pg (ref 26.0–34.0)
MCHC: 34 g/dL (ref 32.0–36.0)
MCV: 82 fL (ref 81–101)
MONO#: 0.8 10*3/uL (ref 0.1–0.9)
MONO%: 20.1 % — ABNORMAL HIGH (ref 0.0–13.0)
NEUT#: 2 10*3/uL (ref 1.5–6.5)
NEUT%: 52.8 % (ref 39.6–80.0)
Platelets: 167 10*3/uL (ref 145–400)
RBC: 4.22 10*6/uL (ref 3.70–5.32)
RDW: 14.9 % (ref 11.1–15.7)
WBC: 3.8 10*3/uL — ABNORMAL LOW (ref 3.9–10.0)

## 2015-05-12 LAB — LACTATE DEHYDROGENASE: LDH: 160 U/L (ref 125–245)

## 2015-05-12 MED ORDER — ONDANSETRON HCL 8 MG PO TABS
8.0000 mg | ORAL_TABLET | Freq: Once | ORAL | Status: AC
  Administered 2015-05-12: 8 mg via ORAL

## 2015-05-12 MED ORDER — ONDANSETRON HCL 8 MG PO TABS
ORAL_TABLET | ORAL | Status: AC
  Filled 2015-05-12: qty 1

## 2015-05-12 MED ORDER — SODIUM CHLORIDE 0.9 % IV SOLN
90.0000 mg | Freq: Once | INTRAVENOUS | Status: AC
  Administered 2015-05-12: 90 mg via INTRAVENOUS
  Filled 2015-05-12: qty 10

## 2015-05-12 MED ORDER — BORTEZOMIB CHEMO SQ INJECTION 3.5 MG (2.5MG/ML)
1.2000 mg/m2 | Freq: Once | INTRAMUSCULAR | Status: AC
  Administered 2015-05-12: 2.5 mg via SUBCUTANEOUS
  Filled 2015-05-12: qty 2.5

## 2015-05-12 NOTE — Patient Instructions (Signed)
Pamidronate injection What is this medicine? PAMIDRONATE (pa mi DROE nate) slows calcium loss from bones. It is used to treat high calcium blood levels from cancer or Paget's disease. It is also used to treat bone pain and prevent fractures from certain cancers that have spread to the bone. This medicine may be used for other purposes; ask your health care provider or pharmacist if you have questions. What should I tell my health care provider before I take this medicine? They need to know if you have any of these conditions: -aspirin-sensitive asthma -dental disease -kidney disease -an unusual or allergic reaction to pamidronate, other medicines, foods, dyes, or preservatives -pregnant or trying to get pregnant -breast-feeding How should I use this medicine? This medicine is for infusion into a vein. It is given by a health care professional in a hospital or clinic setting. Talk to your pediatrician regarding the use of this medicine in children. This medicine is not approved for use in children. Overdosage: If you think you have taken too much of this medicine contact a poison control center or emergency room at once. NOTE: This medicine is only for you. Do not share this medicine with others. What if I miss a dose? This does not apply. What may interact with this medicine? -certain antibiotics given by injection -medicines for inflammation or pain like ibuprofen, naproxen -some diuretics like bumetanide, furosemide -cyclosporine -parathyroid hormone -tacrolimus -teriparatide -thalidomide This list may not describe all possible interactions. Give your health care provider a list of all the medicines, herbs, non-prescription drugs, or dietary supplements you use. Also tell them if you smoke, drink alcohol, or use illegal drugs. Some items may interact with your medicine. What should I watch for while using this medicine? Visit your doctor or health care professional for regular  checkups. It may be some time before you see the benefit from this medicine. Do not stop taking your medicine unless your doctor tells you to. Your doctor may order blood tests or other tests to see how you are doing. Women should inform their doctor if they wish to become pregnant or think they might be pregnant. There is a potential for serious side effects to an unborn child. Talk to your health care professional or pharmacist for more information. You should make sure that you get enough calcium and vitamin D while you are taking this medicine. Discuss the foods you eat and the vitamins you take with your health care professional. Some people who take this medicine have severe bone, joint, and/or muscle pain. This medicine may also increase your risk for a broken thigh bone. Tell your doctor right away if you have pain in your upper leg or groin. Tell your doctor if you have any pain that does not go away or that gets worse. What side effects may I notice from receiving this medicine? Side effects that you should report to your doctor or health care professional as soon as possible: -allergic reactions like skin rash, itching or hives, swelling of the face, lips, or tongue -black or tarry stools -changes in vision -eye inflammation, pain -high blood pressure -jaw pain, especially burning or cramping -muscle weakness -numb, tingling pain -swelling of feet or hands -trouble passing urine or change in the amount of urine -unable to move easily Side effects that usually do not require medical attention (report to your doctor or health care professional if they continue or are bothersome): -bone, joint, or muscle pain -constipation -dizzy, drowsy -fever -headache -loss of  appetite -nausea, vomiting -pain at site where injected This list may not describe all possible side effects. Call your doctor for medical advice about side effects. You may report side effects to FDA at  1-800-FDA-1088. Where should I keep my medicine? This drug is given in a hospital or clinic and will not be stored at home. NOTE: This sheet is a summary. It may not cover all possible information. If you have questions about this medicine, talk to your doctor, pharmacist, or health care provider.    2016, Elsevier/Gold Standard. (2010-07-15 08:49:49) Bortezomib injection What is this medicine? BORTEZOMIB (bor TEZ oh mib) is a medicine that targets proteins in cancer cells and stops the cancer cells from growing. It is used to treat multiple myeloma and mantle-cell lymphoma. This medicine may be used for other purposes; ask your health care provider or pharmacist if you have questions. What should I tell my health care provider before I take this medicine? They need to know if you have any of these conditions: -diabetes -heart disease -irregular heartbeat -liver disease -on hemodialysis -low blood counts, like low white blood cells, platelets, or hemoglobin -peripheral neuropathy -taking medicine for blood pressure -an unusual or allergic reaction to bortezomib, mannitol, boron, other medicines, foods, dyes, or preservatives -pregnant or trying to get pregnant -breast-feeding How should I use this medicine? This medicine is for injection into a vein or for injection under the skin. It is given by a health care professional in a hospital or clinic setting. Talk to your pediatrician regarding the use of this medicine in children. Special care may be needed. Overdosage: If you think you have taken too much of this medicine contact a poison control center or emergency room at once. NOTE: This medicine is only for you. Do not share this medicine with others. What if I miss a dose? It is important not to miss your dose. Call your doctor or health care professional if you are unable to keep an appointment. What may interact with this medicine? This medicine may interact with the following  medications: -ketoconazole -rifampin -ritonavir -St. John's Wort This list may not describe all possible interactions. Give your health care provider a list of all the medicines, herbs, non-prescription drugs, or dietary supplements you use. Also tell them if you smoke, drink alcohol, or use illegal drugs. Some items may interact with your medicine. What should I watch for while using this medicine? Visit your doctor for checks on your progress. This drug may make you feel generally unwell. This is not uncommon, as chemotherapy can affect healthy cells as well as cancer cells. Report any side effects. Continue your course of treatment even though you feel ill unless your doctor tells you to stop. You may get drowsy or dizzy. Do not drive, use machinery, or do anything that needs mental alertness until you know how this medicine affects you. Do not stand or sit up quickly, especially if you are an older patient. This reduces the risk of dizzy or fainting spells. In some cases, you may be given additional medicines to help with side effects. Follow all directions for their use. Call your doctor or health care professional for advice if you get a fever, chills or sore throat, or other symptoms of a cold or flu. Do not treat yourself. This drug decreases your body's ability to fight infections. Try to avoid being around people who are sick. This medicine may increase your risk to bruise or bleed. Call your doctor or health  care professional if you notice any unusual bleeding. You may need blood work done while you are taking this medicine. In some patients, this medicine may cause a serious brain infection that may cause death. If you have any problems seeing, thinking, speaking, walking, or standing, tell your doctor right away. If you cannot reach your doctor, urgently seek other source of medical care. Do not become pregnant while taking this medicine. Women should inform their doctor if they wish to  become pregnant or think they might be pregnant. There is a potential for serious side effects to an unborn child. Talk to your health care professional or pharmacist for more information. Do not breast-feed an infant while taking this medicine. Check with your doctor or health care professional if you get an attack of severe diarrhea, nausea and vomiting, or if you sweat a lot. The loss of too much body fluid can make it dangerous for you to take this medicine. What side effects may I notice from receiving this medicine? Side effects that you should report to your doctor or health care professional as soon as possible: -allergic reactions like skin rash, itching or hives, swelling of the face, lips, or tongue -breathing problems -changes in hearing -changes in vision -fast, irregular heartbeat -feeling faint or lightheaded, falls -pain, tingling, numbness in the hands or feet -right upper belly pain -seizures -swelling of the ankles, feet, hands -unusual bleeding or bruising -unusually weak or tired -vomiting -yellowing of the eyes or skin Side effects that usually do not require medical attention (report to your doctor or health care professional if they continue or are bothersome): -changes in emotions or moods -constipation -diarrhea -loss of appetite -headache -irritation at site where injected -nausea This list may not describe all possible side effects. Call your doctor for medical advice about side effects. You may report side effects to FDA at 1-800-FDA-1088. Where should I keep my medicine? This drug is given in a hospital or clinic and will not be stored at home. NOTE: This sheet is a summary. It may not cover all possible information. If you have questions about this medicine, talk to your doctor, pharmacist, or health care provider.    2016, Elsevier/Gold Standard. (2014-03-17 14:47:04)

## 2015-05-13 LAB — IGG, IGA, IGM
IgA, Qn, Serum: 148 mg/dL (ref 87–352)
IgG, Qn, Serum: 897 mg/dL (ref 700–1600)
IgM, Qn, Serum: 33 mg/dL (ref 26–217)

## 2015-05-13 LAB — KAPPA/LAMBDA LIGHT CHAINS
Ig Kappa Free Light Chain: 21.74 mg/L — ABNORMAL HIGH (ref 3.30–19.40)
Ig Lambda Free Light Chain: 15.86 mg/L (ref 5.71–26.30)
Kappa/Lambda FluidC Ratio: 1.37 (ref 0.26–1.65)

## 2015-05-13 NOTE — Progress Notes (Signed)
Hematology and Oncology Follow Up Visit  BIRD CRYMES [id number] 12/12/1950 65 y.o. 05/13/2015   Principle Diagnosis:   Recurrent lambda light chain myeloma  History of recurrent endometrial carcinoma  Current Therapy:    Status post second autologous stem cell transplant on 07/24/2014  Maintenance therapy with Pomalidomide/every 2 week Velcade  Aredia 60 mg IV Q. 3 month - dose on 05/12/2015  Radiation therapy for endometrial recurrence - completed 04/20/2015     Interim History:  Ms.  Dermody is back for followup. She looks quite good. She completed her radiation therapy for the recurrent endometrial cancer about a month ago. She seemed to tolerate this pretty well.  She is having no problems with bleeding. She's having no problems with changes in bowel or bladder habits. There is no diarrhea.  She's had no leg swelling.  She's had no cough. She's had no fever.  She's doing well with respect to her myeloma therapy. She had her transplant back in June. Her maintenance therapy seems to be doing pretty well. Her last lambda light chain back in March was 1.6 mg/dL. lling. She's had no bone pain.  We did check her lites chain level today. Her lambda light chain was 1.17 mg/dL. She had no monoclonal spike in her serum. This is holding steady.  I probably need to check a 24-hour urine on her soon.  She's had no bony pain. She's had no rashes.  Overall, her performance status is ECOG 1.  Medications: Allergies:  Allergies  Allergen Reactions  . Codeine Nausea Only    Past Medical History, Surgical history, Social history, and Family History were reviewed and updated.  Review of Systems: As above  Physical Exam:  height is 5\' 4"  (1.626 m) and weight is 187 lb (84.823 kg). Her oral temperature is 98.5 F (36.9 C). Her blood pressure is 129/72 and her pulse is 89. Her respiration is 16.   Well-developed and well-nourished [name]. Head and neck exam  shows no ocular or oral lesion. There are no palpable cervical or supraclavicular lymph nodes. Lungs are clear. Cardiac exam regular rate and rhythm with no murmurs rubs or bruits. Abdomen is soft. She has good bowel sounds. There is no fluid wave. There is no palpable liver or spleen tip. Extremities shows no clubbing, cyanosis or edema. Neurological exam shows no focal neurological deficits. Skin exam no rashes, ecchymosis or petechia.  Lab Results  Component Value Date   WBC 3.8* 05/12/2015   HGB 11.8 05/12/2015   HCT 34.7* 05/12/2015   MCV 82 05/12/2015   PLT 167 05/12/2015     Chemistry      Component Value Date/Time   NA 138 05/12/2015 1121   NA 141 03/17/2015 1018   NA 139 01/01/2015 0449   K 3.5 05/12/2015 1121   K 3.2* 03/17/2015 1018   K 3.8 01/01/2015 0449   CL 103 05/12/2015 1121   CL 106 01/01/2015 0449   CO2 26 05/12/2015 1121   CO2 25 03/17/2015 1018   CO2 26 01/01/2015 0449   BUN 15 05/12/2015 1121   BUN 21.3 03/17/2015 1018   BUN 15 01/01/2015 0449   CREATININE 1.2 05/12/2015 1121   CREATININE 1.0 03/17/2015 1018   CREATININE 0.99 01/01/2015 0449      Component Value Date/Time   CALCIUM 9.8 05/12/2015 1121   CALCIUM 9.6 03/17/2015 1018   CALCIUM 8.9 01/01/2015 0449   ALKPHOS 38 05/12/2015 1121   ALKPHOS 47 03/17/2015 1018  ALKPHOS 37* 12/29/2014 0011   AST 22 05/12/2015 1121   AST 17 03/17/2015 1018   AST 26 12/29/2014 0011   ALT 27 05/12/2015 1121   ALT 14 03/17/2015 1018   ALT 21 12/29/2014 0011   BILITOT 0.60 05/12/2015 1121   BILITOT 0.54 03/17/2015 1018   BILITOT 0.5 12/29/2014 0011         Impression and Plan: Ms. Omori is 65 year old female. She has recurrent lambda light chain myeloma. She also had recurrent endometrial cancer. The recurrence was localized. She is being followed by OB/GYN and radiation oncology for this. She completed radiation therapy back in March. She had 50.4 rad for this.  Today, we'll go ahead and give her the  Aredia. We do this every 3-4 months.  I am sorry that she has recurrence of the endometrial cancer. I'm sure that the radiation will take care of this.  We will go ahead and give her the Velcade today. I think she's done well with this..  We will plan to get her back in another month.  I spent about 30 minutes with her today.   [name], MD 4/13/20177:34 AM

## 2015-05-14 LAB — PROTEIN ELECTROPHORESIS, SERUM
A/G Ratio: 1.5 (ref 0.7–1.7)
Albumin: 4.2 g/dL (ref 2.9–4.4)
Alpha 1: 0.1 g/dL (ref 0.0–0.4)
Alpha 2: 0.7 g/dL (ref 0.4–1.0)
Beta: 1.2 g/dL (ref 0.7–1.3)
Gamma Globulin: 0.8 g/dL (ref 0.4–1.8)
Globulin, Total: 2.8 g/dL (ref 2.2–3.9)
M-Spike, %: 0.2 g/dL — ABNORMAL HIGH
Total Protein: 7 g/dL (ref 6.0–8.5)

## 2015-05-18 ENCOUNTER — Other Ambulatory Visit: Payer: Self-pay | Admitting: *Deleted

## 2015-05-18 DIAGNOSIS — C9000 Multiple myeloma not having achieved remission: Secondary | ICD-10-CM

## 2015-05-18 DIAGNOSIS — I10 Essential (primary) hypertension: Secondary | ICD-10-CM

## 2015-05-18 MED ORDER — TRIAMTERENE-HCTZ 37.5-25 MG PO TABS: 1.0000 | ORAL_TABLET | Freq: Every day | ORAL | Status: DC

## 2015-05-20 ENCOUNTER — Encounter: Payer: Self-pay | Admitting: Gynecologic Oncology

## 2015-05-20 ENCOUNTER — Ambulatory Visit: Payer: BC Managed Care – PPO | Attending: Gynecologic Oncology | Admitting: Gynecologic Oncology

## 2015-05-20 VITALS — BP 123/72 | HR 79 | Temp 98.7°F | Resp 18 | Ht 64.0 in | Wt 191.0 lb

## 2015-05-20 DIAGNOSIS — C9000 Multiple myeloma not having achieved remission: Secondary | ICD-10-CM | POA: Insufficient documentation

## 2015-05-20 DIAGNOSIS — C541 Malignant neoplasm of endometrium: Secondary | ICD-10-CM | POA: Diagnosis not present

## 2015-05-20 DIAGNOSIS — N952 Postmenopausal atrophic vaginitis: Secondary | ICD-10-CM | POA: Diagnosis not present

## 2015-05-20 DIAGNOSIS — Z923 Personal history of irradiation: Secondary | ICD-10-CM | POA: Insufficient documentation

## 2015-05-20 DIAGNOSIS — I10 Essential (primary) hypertension: Secondary | ICD-10-CM | POA: Insufficient documentation

## 2015-05-20 DIAGNOSIS — D259 Leiomyoma of uterus, unspecified: Secondary | ICD-10-CM | POA: Diagnosis not present

## 2015-05-20 MED ORDER — LETROZOLE 2.5 MG PO TABS: 2.5000 mg | ORAL_TABLET | Freq: Every day | ORAL | Status: DC

## 2015-05-20 NOTE — Progress Notes (Signed)
Office Visit:  GYN ONCOLOGY   CC:   Recurrent endometrial cancer    Assessment:   65 y.o.  year old with Stage1A Grade 2 endometrioid endometrial cancer staged 10/2011 with recurrence at the distal vagina identified in April 2014.  Subsequent PET scan was negative for evidence of metastatic disease and Emily Ballester WoodwardCompleted vaginal  brachytherapy 07/29/2012.  Second vaginal recurrence (apex) diagnosed December 2016. Imaging without evidence of  metastatic disease.    Options discussed at multidisciplinary tumor conference and she was treated with conformational radiotherapy   03/10/2015 - 04/20/2015 Will treat with Femara.  PET 07/2015 Pap 07/2015 F/U  07/2015   [facility] positive testing Referral for genetic counselling   HPI:  Emily Greer is a 65 y.o. year old G3P2 initially seen in consultation on 10/05/2011  grade 1  endometrial cancer  She then underwent a  total abdominal hysterectomy bilateral salpingo-oophorectomy bilateral pelvic lymph node dissection on  46/96/2952 without complications.  Her postoperative course was  uncomplicated.  Her final pathologic diagnosis is a Stage  1A Grade  2 endometrioid endometrial cancer with  negative lymphovascular space invasion,  2/20 (10%) of myometrial invasion and negative lymph nodes.  On 1/14 visit she reported  post coital vaginal bleeding  that was self limiting. Vaginal biopsy - LARGELY DENUDED SQUAMOUS EPITHELIUM WITH ASSOCIATED SPONGIOSIS AND CHRONIC INFLAMMATION.- NO DYSPLASIA OR MALIGNANCY  On the visit 04/2012 she c/o vaginal bleeding.  Biopsies were c/w metastatic disease at the distal vagina  PET 06/05/2012 IMPRESSION:  1. There are no specific features identified to suggest  hypermetabolic metastasis from endometrial carcinoma.  2. Skeletal changes of multiple myeloma with focal area of increased uptake   Treated with vaginal brachytherapy June 4, June 11, June 19, June 25, June 30/2014 Site/dose: Vagina, 30.5 Gy in 5 fractions  (6 Gy, 6 Gy, 6 Gy, 6 Gy, 6.5 Gy)  Pap 08/17/2013 ASCUS HPV neg  Interval Hx: The patient was last seen by gyn onc on 10/29/13 with a normal exam. She carries the diagnosis of myeloma (recurrent lambda light chain myeloma) and underwent a second autologous stem cell transplant on 07/24/14 at [facility]. She was admitted to hospital in November 2016 with pneumonia.  She reports light vaginal spotting with micturition since September 2016. She was seen by Dr Leo Grosser on 01/21/15 at which time a friable lesion was noted at the vaginal cuff. This was biopsied and was consistent with endometrioid adenocarcinoma (recurrence).  CT C/A/P 02/15/2015 without evidence of metastatic disease.  Care complicated by successful transplant 2016 for Rx multiple myeloma.   Past Medical History  Diagnosis Date  . Lambda light chain myeloma ([place]) 11/11/2007  . Hypertension 11/28/97  . H/O multiple myeloma 11/28/1997  . Staphylococcus aureus bacteremia 11/28/1997  . Staphylococcus epidermidis bacteremia 11/28/97  . Pregnancy induced hypertension   . Osteoporosis 12/18/05    Increased  risk   . Increased BMI 12/18/05  . Post-menopausal bleeding 12/19/06  . Vaginal atrophy 12/19/06  . Elevated hemoglobin A1c     Borderline  . Fibroid 07/26/10    asymptomatic  . Vaginal atrophy   . PONV (postoperative nausea and vomiting)     nausea in past, none recent  . Endometrial carcinoma ([place]) 05/28/12  . History of radiation therapy 6/4, 6/11, 6/19, 6/25, 07/29/2012    vagina 30.5 gray in 5 fx, HDR brachytherapy     Past Surgical History  Procedure Laterality Date  . Tubal ligation  1986  . Ectopic pregnancy surgery  1992  . Hysteroscopy w/d&c  09/27/2011    Procedure: DILATATION AND CURETTAGE /HYSTEROSCOPY;  Surgeon: Eldred Manges, MD;  Location: [facility];  Service: Gynecology;;  . Laparotomy  10/31/2011    Procedure: EXPLORATORY LAPAROTOMY;  Surgeon: [name], MD PHD;  Location: [facility];  Service: Gynecology;   Laterality: N/A;  . Abdominal hysterectomy  10/31/2011    Procedure: HYSTERECTOMY ABDOMINAL;  Surgeon: [name], MD PHD;  Location: [facility];  Service: Gynecology;  Laterality: N/A;  . Salpingoophorectomy  10/31/2011    Procedure: SALPINGO OOPHERECTOMY;  Surgeon: [name], MD PHD;  Location: [facility];  Service: Gynecology;  Laterality: Bilateral;   Lymph Nodes sampling   Social History: Retired from [employer].  Her husband is well, also retired.   Review of systems: Constitutional:  She has no fever or chills. No changes in weight.  Cardiovascular: No chest pain, palpitations or edema. Respiratory:  No shortness of breath, wheezing or cough Gastrointestinal: She has normal bowel movements without diarrhea or constipation. She denies any nausea or vomiting.   Genitourinary:  She denies pelvic pain, pelvic pressure or changes in her urinary function. + vaginal bleeding (light) Musculoskeletal:  No changes in gait or joint pain Otherwise uninformative 10 point review of system   Physical Exam: Blood pressure 123/72, pulse 79, temperature 98.7 F (37.1 C), temperature source Oral, resp. rate 18, height 5' 4" (1.626 m), weight 191 lb (86.637 kg). Wt Readings from Last 3 Encounters:  05/20/15 191 lb (86.637 kg)  05/12/15 187 lb (84.823 kg)  04/20/15 190 lb 11.2 oz (86.501 kg)    General: Well dressed, well nourished in no apparent distress.   Lungs:  Clear to auscultation bilaterally.  No wheezes. Cardiovascular:  Regular rate and rhythm.  No murmurs or rubs. Abdomen:  Soft, nontender, nondistended.  No palpable masses.  No hepatosplenomegaly.  No ascites. Normal bowel sounds.  No hernias.   Genitourinary: Normal EGBUS radiation changes at the vaginal apex.  Smooth to touch Back: No CVA tenderness  LN:  No cervical supraclavicular or inguinal adenopathy  Extremities: No cyanosis, clubbing or edema.  No calf tenderness or erythema Musculoskeletal: No pain, normal strength  and range of motion.

## 2015-05-20 NOTE — Patient Instructions (Addendum)
Plan for a PET scan on June 26 at 9 am and follow up with Dr. Skeet Latch on June 29.  Nothing to eat or drink after midnight for the PET scan and arrive at 8:30am.    Letrozole tablets What is this medicine? LETROZOLE (LET roe zole) blocks the production of estrogen. Certain types of breast cancer grow under the influence of estrogen. Letrozole helps block tumor growth. This medicine is used to treat advanced breast cancer in postmenopausal women. This medicine may be used for other purposes; ask your health care provider or pharmacist if you have questions. What should I tell my health care provider before I take this medicine? They need to know if you have any of these conditions: -liver disease -osteoporosis (weak bones) -an unusual or allergic reaction to letrozole, other medicines, foods, dyes, or preservatives -pregnant or trying to get pregnant -breast-feeding How should I use this medicine? Take this medicine by mouth with a glass of water. You may take it with or without food. Follow the directions on the prescription label. Take your medicine at regular intervals. Do not take your medicine more often than directed. Do not stop taking except on your doctor's advice. Talk to your pediatrician regarding the use of this medicine in children. Special care may be needed. Overdosage: If you think you have taken too much of this medicine contact a poison control center or emergency room at once. NOTE: This medicine is only for you. Do not share this medicine with others. What if I miss a dose? If you miss a dose, take it as soon as you can. If it is almost time for your next dose, take only that dose. Do not take double or extra doses. What may interact with this medicine? Do not take this medicine with any of the following medications: -estrogens, like hormone replacement therapy or birth control pills This medicine may also interact with the following medications: -dietary supplements such  as androstenedione or DHEA -prasterone -tamoxifen This list may not describe all possible interactions. Give your health care provider a list of all the medicines, herbs, non-prescription drugs, or dietary supplements you use. Also tell them if you smoke, drink alcohol, or use illegal drugs. Some items may interact with your medicine. What should I watch for while using this medicine? Visit your doctor or health care professional for regular check-ups to monitor your condition. Do not use this drug if you are pregnant. Serious side effects to an unborn child are possible. Talk to your doctor or pharmacist for more information. You may get drowsy or dizzy. Do not drive, use machinery, or do anything that needs mental alertness until you know how this medicine affects you. Do not stand or sit up quickly, especially if you are an older patient. This reduces the risk of dizzy or fainting spells. What side effects may I notice from receiving this medicine? Side effects that you should report to your doctor or health care professional as soon as possible: -allergic reactions like skin rash, itching, or hives -bone fracture -chest pain -difficulty breathing or shortness of breath -severe pain, swelling, warmth in the leg -unusually weak or tired -vaginal bleeding Side effects that usually do not require medical attention (report to your doctor or health care professional if they continue or are bothersome): -bone, back, joint, or muscle pain -dizziness -fatigue -fluid retention -headache -hot flashes, night sweats -nausea -weight gain This list may not describe all possible side effects. Call your doctor for medical advice  about side effects. You may report side effects to FDA at 1-800-FDA-1088. Where should I keep my medicine? Keep out of the reach of children. Store between 15 and 30 degrees C (59 and 86 degrees F). Throw away any unused medicine after the expiration date. NOTE: This sheet  is a summary. It may not cover all possible information. If you have questions about this medicine, talk to your doctor, pharmacist, or health care provider.    2016, Elsevier/Gold Standard. (2007-03-29 16:43:44)

## 2015-05-26 ENCOUNTER — Ambulatory Visit: Payer: BC Managed Care – PPO

## 2015-05-26 ENCOUNTER — Other Ambulatory Visit: Payer: BC Managed Care – PPO

## 2015-05-27 ENCOUNTER — Ambulatory Visit (HOSPITAL_BASED_OUTPATIENT_CLINIC_OR_DEPARTMENT_OTHER): Payer: BC Managed Care – PPO

## 2015-05-27 ENCOUNTER — Other Ambulatory Visit (HOSPITAL_BASED_OUTPATIENT_CLINIC_OR_DEPARTMENT_OTHER): Payer: BC Managed Care – PPO

## 2015-05-27 VITALS — BP 118/72 | HR 72 | Temp 98.0°F | Resp 18

## 2015-05-27 DIAGNOSIS — C9000 Multiple myeloma not having achieved remission: Secondary | ICD-10-CM | POA: Diagnosis not present

## 2015-05-27 DIAGNOSIS — C9001 Multiple myeloma in remission: Secondary | ICD-10-CM

## 2015-05-27 DIAGNOSIS — Z5112 Encounter for antineoplastic immunotherapy: Secondary | ICD-10-CM | POA: Diagnosis not present

## 2015-05-27 LAB — CMP (CANCER CENTER ONLY)
ALT(SGPT): 21 U/L (ref 10–47)
AST: 24 U/L (ref 11–38)
Albumin: 4.2 g/dL (ref 3.3–5.5)
Alkaline Phosphatase: 42 U/L (ref 26–84)
BUN, Bld: 17 mg/dL (ref 7–22)
CO2: 26 mEq/L (ref 18–33)
Calcium: 9.6 mg/dL (ref 8.0–10.3)
Chloride: 99 mEq/L (ref 98–108)
Creat: 1.3 mg/dl — ABNORMAL HIGH (ref 0.6–1.2)
Glucose, Bld: 92 mg/dL (ref 73–118)
Potassium: 3.3 mEq/L (ref 3.3–4.7)
Sodium: 137 mEq/L (ref 128–145)
Total Bilirubin: 0.8 mg/dl (ref 0.20–1.60)
Total Protein: 7.5 g/dL (ref 6.4–8.1)

## 2015-05-27 LAB — CBC WITH DIFFERENTIAL (CANCER CENTER ONLY)
BASO#: 0.1 10*3/uL (ref 0.0–0.2)
BASO%: 1.9 % (ref 0.0–2.0)
EOS%: 1.9 % (ref 0.0–7.0)
Eosinophils Absolute: 0.1 10*3/uL (ref 0.0–0.5)
HCT: 34.6 % — ABNORMAL LOW (ref 34.8–46.6)
HGB: 11.9 g/dL (ref 11.6–15.9)
LYMPH#: 1.1 10*3/uL (ref 0.9–3.3)
LYMPH%: 26.8 % (ref 14.0–48.0)
MCH: 28 pg (ref 26.0–34.0)
MCHC: 34.4 g/dL (ref 32.0–36.0)
MCV: 81 fL (ref 81–101)
MONO#: 0.7 10*3/uL (ref 0.1–0.9)
MONO%: 16.8 % — ABNORMAL HIGH (ref 0.0–13.0)
NEUT#: 2.2 10*3/uL (ref 1.5–6.5)
NEUT%: 52.6 % (ref 39.6–80.0)
Platelets: 237 10*3/uL (ref 145–400)
RBC: 4.25 10*6/uL (ref 3.70–5.32)
RDW: 15.6 % (ref 11.1–15.7)
WBC: 4.2 10*3/uL (ref 3.9–10.0)

## 2015-05-27 MED ORDER — ONDANSETRON HCL 8 MG PO TABS
ORAL_TABLET | ORAL | Status: AC
  Filled 2015-05-27: qty 1

## 2015-05-27 MED ORDER — BORTEZOMIB CHEMO SQ INJECTION 3.5 MG (2.5MG/ML)
1.2000 mg/m2 | Freq: Once | INTRAMUSCULAR | Status: AC
  Administered 2015-05-27: 2.5 mg via SUBCUTANEOUS
  Filled 2015-05-27: qty 2.5

## 2015-05-27 MED ORDER — ONDANSETRON HCL 8 MG PO TABS
8.0000 mg | ORAL_TABLET | Freq: Once | ORAL | Status: AC
  Administered 2015-05-27: 8 mg via ORAL

## 2015-05-27 NOTE — Patient Instructions (Signed)
[facility] Discharge Instructions for Patients Receiving Chemotherapy  Today you received the following chemotherapy agents Velcade  To help prevent nausea and vomiting after your treatment, we encourage you to take your nausea medication    If you develop nausea and vomiting that is not controlled by your nausea medication, call the clinic.   BELOW ARE SYMPTOMS THAT SHOULD BE REPORTED IMMEDIATELY:  *FEVER GREATER THAN 100.5 F  *CHILLS WITH OR WITHOUT FEVER  NAUSEA AND VOMITING THAT IS NOT CONTROLLED WITH YOUR NAUSEA MEDICATION  *UNUSUAL SHORTNESS OF BREATH  *UNUSUAL BRUISING OR BLEEDING  TENDERNESS IN MOUTH AND THROAT WITH OR WITHOUT PRESENCE OF ULCERS  *URINARY PROBLEMS  *BOWEL PROBLEMS  UNUSUAL RASH Items with * indicate a potential emergency and should be followed up as soon as possible.  Feel free to call the clinic you have any questions or concerns. The clinic phone number is [phone].  Please show the CHEMO ALERT CARD at check-in to the Emergency Department and triage nurse.   

## 2015-05-31 ENCOUNTER — Encounter: Payer: Self-pay | Admitting: Oncology

## 2015-06-03 ENCOUNTER — Other Ambulatory Visit: Payer: Self-pay | Admitting: *Deleted

## 2015-06-03 ENCOUNTER — Ambulatory Visit
Admission: RE | Admit: 2015-06-03 | Discharge: 2015-06-03 | Disposition: A | Discharge status: Home / Self Care | Payer: BC Managed Care – PPO | Source: Ambulatory Visit | Attending: Radiation Oncology | Admitting: Radiation Oncology

## 2015-06-03 ENCOUNTER — Other Ambulatory Visit: Payer: Self-pay | Admitting: Hematology & Oncology

## 2015-06-03 VITALS — BP 134/78 | HR 90 | Temp 98.2°F | Resp 16 | Ht 64.0 in | Wt 187.1 lb

## 2015-06-03 DIAGNOSIS — C7982 Secondary malignant neoplasm of genital organs: Principal | ICD-10-CM

## 2015-06-03 DIAGNOSIS — C541 Malignant neoplasm of endometrium: Secondary | ICD-10-CM

## 2015-06-03 MED ORDER — POMALIDOMIDE 1 MG PO CAPS: ORAL_CAPSULE | ORAL | Status: DC

## 2015-06-03 NOTE — Progress Notes (Signed)
Radiation Oncology         [phone] ________________________________  Name: Emily Greer MRN: [id number]  Date: 06/03/2015  DOB: Sep 09, 1950  Follow-Up Visit Note  CC: Foye Spurling, MD  Everitt Amber, MD    ICD-9-CM ICD-10-CM   1. 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 Completed radiation 03/10/2015-04/20/2015 to the vagina with 50.4 in 28 fractions.  Narrative:  The patient returns today for routine follow-up. She denies having pain. She reports having occasional dysuria after drinking juices. She also reports having diarrhea depending on what she eats. She denies having any vaginal/rectal bleeding or discharge. She started taking femera in April. She reports her energy level is good. She mentions that she has a good appetite. She has not been using a dilator. She saw Dr. Skeet Latch a couple weeks ago and had a pelvic exam with no signs of residual tumor.  ALLERGIES:  is allergic to codeine.  Meds: Current Outpatient Prescriptions  Medication Sig Dispense Refill  . acyclovir (ZOVIRAX) 400 MG tablet Take 1 tablet (400 mg total) by mouth 2 (two) times daily. 60 tablet 2  . albuterol (PROVENTIL HFA;VENTOLIN HFA) 108 (90 BASE) MCG/ACT inhaler Inhale 2 puffs into the lungs every 6 (six) hours as needed for wheezing or shortness of breath. 2 puffs 3 times daily x 5 days then every 6 hours as needed. 1 Inhaler 0  . amLODipine (NORVASC) 10 MG tablet TAKE 1 TABLET BY MOUTH DAILY    . bortezomib IV (VELCADE) 3.5 MG injection Inject 1.3 mg/m2 into the vein once. Patient is taking 2.5 MG injection every other week    . Cholecalciferol (VITAMIN D3) 2000 UNITS TABS Take 1 tablet by mouth daily.    [name] Kitchen letrozole (FEMARA) 2.5 MG tablet Take 1 tablet (2.5 mg total) by mouth daily. 60 tablet 4  . loperamide (IMODIUM) 2 MG capsule Take by mouth as  needed for diarrhea or loose stools.    [name] Kitchen loratadine-pseudoephedrine (CLARITIN-D 24-HOUR) 10-240 MG 24 hr tablet Take 1 tablet by mouth daily.    . ondansetron (ZOFRAN) 8 MG tablet Take 8 mg by mouth every 8 (eight) hours as needed for nausea or vomiting. Reported on 03/04/2015    . oxymetazoline (AFRIN) 0.05 % nasal spray Place 1 spray into both nostrils 2 (two) times daily. Reported on 01/29/2015    . POMALYST 1 MG capsule TAKE 1 CAPSULE BY MOUTH EVERY DAY FOR 21 DAYS, THEN OFF 7 DAYS. 21 capsule 4  . potassium chloride SA (K-DUR,KLOR-CON) 20 MEQ tablet Take 1 tablet (20 mEq total) by mouth 2 (two) times daily. 60 tablet 3  . triamterene-hydrochlorothiazide (MAXZIDE-25) 37.5-25 MG tablet Take 1 tablet by mouth daily. 30 tablet 6  . loratadine (CLARITIN) 10 MG tablet Take 10 mg by mouth every morning. Reported on 06/03/2015     No current facility-administered medications for this encounter.    Physical Findings: The patient is in no acute distress. Patient is alert and oriented.  height is 5\' 4"  (1.626 m) and weight is 187 lb 1.6 oz (84.868 kg). Her oral temperature is 98.2 F (36.8 C). Her blood pressure is 134/78 and her pulse is 90. Her respiration is 16. No significant changes. The lungs are clear to auscultation. The heart has a regular rhythm and rate. The abdomen is soft and nontender with normal  bowel sounds. No palpable subclavicular or axillary adenopathy. Pelvic exam was not performed In light of recent exam by Dr. Skeet Latch.  Lab Findings: Lab Results  Component Value Date   WBC 4.2 05/27/2015   HGB 11.9 05/27/2015   HCT 34.6* 05/27/2015   MCV 81 05/27/2015   PLT 237 05/27/2015    Radiographic Findings: No results found.  Impression:  The patient is recovering from the effects of radiation.  Plan:  She has a yearly follow up for her myeloma at [facility] in July. I recommended that she starts using the dilator. She has a follow up appointment with Dr. Skeet Latch at the end of June  and have a PET scan at that time. I will follow up with her in late September or early October. ____________________________________  Blair Promise, PhD, MD    This document serves as a record of services personally performed by Gery Pray, MD. It was created on his behalf by Lendon Collar, a trained medical scribe. The creation of this record is based on the scribe's personal observations and the provider's statements to them. This document has been checked and approved by the attending provider.

## 2015-06-03 NOTE — Progress Notes (Signed)
Emily Greer is here for follow up.  She denies having pain.  She reports having occasional dysuria after drinking juices.  She also reports having diarrhea depending on what she eats.  She denies having any vaginal/rectal bleeding or discharge.  She started taking femera in April.  She reports her energy level is good. She has not been using a dilator.  BP 134/78 mmHg  Pulse 90  Temp(Src) 98.2 F (36.8 C) (Oral)  Resp 16  Ht 5\' 4"  (1.626 m)  Wt 187 lb 1.6 oz (84.868 kg)  BMI 32.10 kg/m2   Wt Readings from Last 3 Encounters:  06/03/15 187 lb 1.6 oz (84.868 kg)  05/20/15 191 lb (86.637 kg)  05/12/15 187 lb (84.823 kg)

## 2015-06-09 ENCOUNTER — Ambulatory Visit (HOSPITAL_BASED_OUTPATIENT_CLINIC_OR_DEPARTMENT_OTHER): Payer: BC Managed Care – PPO

## 2015-06-09 ENCOUNTER — Ambulatory Visit (HOSPITAL_BASED_OUTPATIENT_CLINIC_OR_DEPARTMENT_OTHER): Payer: BC Managed Care – PPO | Admitting: Hematology & Oncology

## 2015-06-09 ENCOUNTER — Other Ambulatory Visit (HOSPITAL_BASED_OUTPATIENT_CLINIC_OR_DEPARTMENT_OTHER): Payer: BC Managed Care – PPO

## 2015-06-09 ENCOUNTER — Encounter: Payer: Self-pay | Admitting: Hematology & Oncology

## 2015-06-09 VITALS — BP 135/81 | HR 83 | Temp 98.4°F | Resp 16 | Ht 64.0 in | Wt 190.0 lb

## 2015-06-09 DIAGNOSIS — C541 Malignant neoplasm of endometrium: Secondary | ICD-10-CM | POA: Diagnosis not present

## 2015-06-09 DIAGNOSIS — Z5112 Encounter for antineoplastic immunotherapy: Secondary | ICD-10-CM

## 2015-06-09 DIAGNOSIS — C9000 Multiple myeloma not having achieved remission: Secondary | ICD-10-CM

## 2015-06-09 DIAGNOSIS — C9002 Multiple myeloma in relapse: Secondary | ICD-10-CM

## 2015-06-09 LAB — CMP (CANCER CENTER ONLY)
ALT(SGPT): 23 U/L (ref 10–47)
AST: 27 U/L (ref 11–38)
Albumin: 3.7 g/dL (ref 3.3–5.5)
Alkaline Phosphatase: 39 U/L (ref 26–84)
BUN, Bld: 18 mg/dL (ref 7–22)
CO2: 28 mEq/L (ref 18–33)
Calcium: 9.6 mg/dL (ref 8.0–10.3)
Chloride: 104 mEq/L (ref 98–108)
Creat: 1.3 mg/dl — ABNORMAL HIGH (ref 0.6–1.2)
Glucose, Bld: 91 mg/dL (ref 73–118)
Potassium: 3.5 mEq/L (ref 3.3–4.7)
Sodium: 137 mEq/L (ref 128–145)
Total Bilirubin: 0.6 mg/dl (ref 0.20–1.60)
Total Protein: 7.2 g/dL (ref 6.4–8.1)

## 2015-06-09 LAB — CBC WITH DIFFERENTIAL (CANCER CENTER ONLY)
BASO#: 0 10*3/uL (ref 0.0–0.2)
BASO%: 0.9 % (ref 0.0–2.0)
EOS%: 3.7 % (ref 0.0–7.0)
Eosinophils Absolute: 0.2 10*3/uL (ref 0.0–0.5)
HCT: 33.5 % — ABNORMAL LOW (ref 34.8–46.6)
HGB: 11.4 g/dL — ABNORMAL LOW (ref 11.6–15.9)
LYMPH#: 1 10*3/uL (ref 0.9–3.3)
LYMPH%: 21.4 % (ref 14.0–48.0)
MCH: 27.9 pg (ref 26.0–34.0)
MCHC: 34 g/dL (ref 32.0–36.0)
MCV: 82 fL (ref 81–101)
MONO#: 0.7 10*3/uL (ref 0.1–0.9)
MONO%: 16.2 % — ABNORMAL HIGH (ref 0.0–13.0)
NEUT#: 2.6 10*3/uL (ref 1.5–6.5)
NEUT%: 57.8 % (ref 39.6–80.0)
Platelets: 191 10*3/uL (ref 145–400)
RBC: 4.08 10*6/uL (ref 3.70–5.32)
RDW: 16 % — ABNORMAL HIGH (ref 11.1–15.7)
WBC: 4.6 10*3/uL (ref 3.9–10.0)

## 2015-06-09 LAB — LACTATE DEHYDROGENASE: LDH: 178 U/L (ref 125–245)

## 2015-06-09 MED ORDER — BORTEZOMIB CHEMO SQ INJECTION 3.5 MG (2.5MG/ML)
1.2000 mg/m2 | Freq: Once | INTRAMUSCULAR | Status: AC
Start: 2015-06-09 — End: 2015-06-09
  Administered 2015-06-09: 2.5 mg via SUBCUTANEOUS
  Filled 2015-06-09: qty 2.5

## 2015-06-09 MED ORDER — ONDANSETRON HCL 8 MG PO TABS
8.0000 mg | ORAL_TABLET | Freq: Once | ORAL | Status: AC
  Administered 2015-06-09: 8 mg via ORAL

## 2015-06-09 MED ORDER — ONDANSETRON HCL 8 MG PO TABS
ORAL_TABLET | ORAL | Status: AC
  Filled 2015-06-09: qty 1

## 2015-06-09 NOTE — Progress Notes (Signed)
Hematology and Oncology Follow Up Visit  Emily Greer [id number] 04-10-1950 65 y.o. 06/09/2015   Principle Diagnosis:   Recurrent lambda light chain myeloma  History of recurrent endometrial carcinoma  Current Therapy:    Status post second autologous stem cell transplant on 07/24/2014  Maintenance therapy with Pomalidomide/every 2 week Velcade  Aredia 60 mg IV Q. 3 month - dose on 05/12/2015  Radiation therapy for endometrial recurrence - completed 04/20/2015  Femara 2.5mg  po q day     Interim History:  [name] is back for followup. She looks quite good. She completed her radiation therapy for the recurrent endometrial cancer about  2 months ago. She seemed to tolerate this pretty well.   Her gynecologic oncologist is going to start her on Femara. I did this is very reasonable. I don't see any problems with her bones that she is on Aredia every 3 months.   Her last lambda light chain was 1.6 mg/dL. This is holding pretty steady.  She does not see the transplant doctors at [facility] until July.  She is having no problems with bleeding. She's having no problems with changes in bowel or bladder habits. There is no diarrhea.  She's had no leg swelling.   She has tolerated the Pomalidomide and Velcade very nicely.  Overall, her performance status is ECOG 1.  Medications: Allergies:  Allergies  Allergen Reactions  . Codeine Nausea Only    Past Medical History, Surgical history, Social history, and Family History were reviewed and updated.  Review of Systems: As above  Physical Exam:  height is 5\' 4"  (1.626 m) and weight is 190 lb (86.183 kg). Her oral temperature is 98.4 F (36.9 C). Her blood pressure is 135/81 and her pulse is 83. Her respiration is 16.   Well-developed and well-nourished [name]. Head and neck exam shows no ocular or oral lesion. There are no palpable cervical or supraclavicular lymph nodes. Lungs are clear. Cardiac exam  regular rate and rhythm with no murmurs rubs or bruits. Abdomen is soft. She has good bowel sounds. There is no fluid wave. There is no palpable liver or spleen tip. Extremities shows no clubbing, cyanosis or edema. Neurological exam shows no focal neurological deficits. Skin exam no rashes, ecchymosis or petechia.  Lab Results  Component Value Date   WBC 4.6 06/09/2015   HGB 11.4* 06/09/2015   HCT 33.5* 06/09/2015   MCV 82 06/09/2015   PLT 191 06/09/2015     Chemistry      Component Value Date/Time   NA 137 06/09/2015 1101   NA 141 03/17/2015 1018   NA 139 01/01/2015 0449   K 3.5 06/09/2015 1101   K 3.2* 03/17/2015 1018   K 3.8 01/01/2015 0449   CL 104 06/09/2015 1101   CL 106 01/01/2015 0449   CO2 28 06/09/2015 1101   CO2 25 03/17/2015 1018   CO2 26 01/01/2015 0449   BUN 18 06/09/2015 1101   BUN 21.3 03/17/2015 1018   BUN 15 01/01/2015 0449   CREATININE 1.3* 06/09/2015 1101   CREATININE 1.0 03/17/2015 1018   CREATININE 0.99 01/01/2015 0449      Component Value Date/Time   CALCIUM 9.6 06/09/2015 1101   CALCIUM 9.6 03/17/2015 1018   CALCIUM 8.9 01/01/2015 0449   ALKPHOS 39 06/09/2015 1101   ALKPHOS 47 03/17/2015 1018   ALKPHOS 37* 12/29/2014 0011   AST 27 06/09/2015 1101   AST 17 03/17/2015 1018   AST 26 12/29/2014 0011  ALT 23 06/09/2015 1101   ALT 14 03/17/2015 1018   ALT 21 12/29/2014 0011   BILITOT 0.60 06/09/2015 1101   BILITOT 0.54 03/17/2015 1018   BILITOT 0.5 12/29/2014 0011         Impression and Plan: Emily Greer is 65 year old female. She has recurrent lambda light chain myeloma. She also had recurrent endometrial cancer. The recurrence was localized. She is being followed by OB/GYN and radiation oncology for this. She completed radiation therapy back in March. She had 50.4 rad for this.   We will go ahead with Velcade today. We do this every 2 weeks.  We'll have her come back in 2 weeks just for Velcade. She does not need any labs.  She will  bring in a 24-hour urine. I would like to have this checked.  I will plan to see her back in one month  I spent about 30 minutes with her today.   [name], MD 5/10/201711:58 AM

## 2015-06-09 NOTE — Patient Instructions (Signed)
[facility] Discharge Instructions for Patients Receiving Chemotherapy  Today you received the following chemotherapy agents Velcade  To help prevent nausea and vomiting after your treatment, we encourage you to take your nausea medication    If you develop nausea and vomiting that is not controlled by your nausea medication, call the clinic.   BELOW ARE SYMPTOMS THAT SHOULD BE REPORTED IMMEDIATELY:  *FEVER GREATER THAN 100.5 F  *CHILLS WITH OR WITHOUT FEVER  NAUSEA AND VOMITING THAT IS NOT CONTROLLED WITH YOUR NAUSEA MEDICATION  *UNUSUAL SHORTNESS OF BREATH  *UNUSUAL BRUISING OR BLEEDING  TENDERNESS IN MOUTH AND THROAT WITH OR WITHOUT PRESENCE OF ULCERS  *URINARY PROBLEMS  *BOWEL PROBLEMS  UNUSUAL RASH Items with * indicate a potential emergency and should be followed up as soon as possible.  Feel free to call the clinic you have any questions or concerns. The clinic phone number is [phone].  Please show the CHEMO ALERT CARD at check-in to the Emergency Department and triage nurse.   

## 2015-06-10 ENCOUNTER — Other Ambulatory Visit: Payer: Self-pay | Admitting: *Deleted

## 2015-06-10 DIAGNOSIS — C9002 Multiple myeloma in relapse: Secondary | ICD-10-CM

## 2015-06-10 LAB — IGG, IGA, IGM
IgA, Qn, Serum: 168 mg/dL (ref 87–352)
IgG, Qn, Serum: 1029 mg/dL (ref 700–1600)
IgM, Qn, Serum: 34 mg/dL (ref 26–217)

## 2015-06-10 LAB — KAPPA/LAMBDA LIGHT CHAINS
Ig Kappa Free Light Chain: 25.04 mg/L — ABNORMAL HIGH (ref 3.30–19.40)
Ig Lambda Free Light Chain: 16.69 mg/L (ref 5.71–26.30)
Kappa/Lambda FluidC Ratio: 1.5 (ref 0.26–1.65)

## 2015-06-10 LAB — PROTEIN ELECTROPHORESIS, SERUM, WITH REFLEX
A/G Ratio: 1.5 (ref 0.7–1.7)
Albumin: 4.1 g/dL (ref 2.9–4.4)
Alpha 1: 0.2 g/dL (ref 0.0–0.4)
Alpha 2: 0.7 g/dL (ref 0.4–1.0)
Beta: 1 g/dL (ref 0.7–1.3)
Gamma Globulin: 0.9 g/dL (ref 0.4–1.8)
Globulin, Total: 2.8 g/dL (ref 2.2–3.9)
Total Protein: 6.9 g/dL (ref 6.0–8.5)

## 2015-06-23 ENCOUNTER — Ambulatory Visit (HOSPITAL_BASED_OUTPATIENT_CLINIC_OR_DEPARTMENT_OTHER): Payer: BC Managed Care – PPO

## 2015-06-23 ENCOUNTER — Other Ambulatory Visit (HOSPITAL_BASED_OUTPATIENT_CLINIC_OR_DEPARTMENT_OTHER): Payer: BC Managed Care – PPO

## 2015-06-23 VITALS — BP 125/75 | HR 72 | Temp 98.3°F | Resp 16

## 2015-06-23 DIAGNOSIS — C9000 Multiple myeloma not having achieved remission: Secondary | ICD-10-CM

## 2015-06-23 DIAGNOSIS — Z5112 Encounter for antineoplastic immunotherapy: Secondary | ICD-10-CM | POA: Diagnosis not present

## 2015-06-23 DIAGNOSIS — C541 Malignant neoplasm of endometrium: Secondary | ICD-10-CM

## 2015-06-23 DIAGNOSIS — C9002 Multiple myeloma in relapse: Secondary | ICD-10-CM

## 2015-06-23 LAB — CMP (CANCER CENTER ONLY)
ALT(SGPT): 23 U/L (ref 10–47)
AST: 24 U/L (ref 11–38)
Albumin: 3.9 g/dL (ref 3.3–5.5)
Alkaline Phosphatase: 38 U/L (ref 26–84)
BUN, Bld: 16 mg/dL (ref 7–22)
CO2: 27 mEq/L (ref 18–33)
Calcium: 9.7 mg/dL (ref 8.0–10.3)
Chloride: 103 mEq/L (ref 98–108)
Creat: 1 mg/dl (ref 0.6–1.2)
Glucose, Bld: 85 mg/dL (ref 73–118)
Potassium: 3.5 mEq/L (ref 3.3–4.7)
Sodium: 139 mEq/L (ref 128–145)
Total Bilirubin: 0.7 mg/dl (ref 0.20–1.60)
Total Protein: 7.6 g/dL (ref 6.4–8.1)

## 2015-06-23 LAB — CBC WITH DIFFERENTIAL (CANCER CENTER ONLY)
BASO#: 0.1 10*3/uL (ref 0.0–0.2)
BASO%: 1.6 % (ref 0.0–2.0)
EOS%: 1.4 % (ref 0.0–7.0)
Eosinophils Absolute: 0.1 10*3/uL (ref 0.0–0.5)
HCT: 34.8 % (ref 34.8–46.6)
HGB: 11.9 g/dL (ref 11.6–15.9)
LYMPH#: 1.2 10*3/uL (ref 0.9–3.3)
LYMPH%: 31.6 % (ref 14.0–48.0)
MCH: 28.2 pg (ref 26.0–34.0)
MCHC: 34.2 g/dL (ref 32.0–36.0)
MCV: 83 fL (ref 81–101)
MONO#: 0.5 10*3/uL (ref 0.1–0.9)
MONO%: 13.8 % — ABNORMAL HIGH (ref 0.0–13.0)
NEUT#: 1.9 10*3/uL (ref 1.5–6.5)
NEUT%: 51.6 % (ref 39.6–80.0)
Platelets: 220 10*3/uL (ref 145–400)
RBC: 4.22 10*6/uL (ref 3.70–5.32)
RDW: 15.9 % — ABNORMAL HIGH (ref 11.1–15.7)
WBC: 3.7 10*3/uL — ABNORMAL LOW (ref 3.9–10.0)

## 2015-06-23 MED ORDER — BORTEZOMIB CHEMO SQ INJECTION 3.5 MG (2.5MG/ML)
1.2000 mg/m2 | Freq: Once | INTRAMUSCULAR | Status: AC
  Administered 2015-06-23: 2.5 mg via SUBCUTANEOUS
  Filled 2015-06-23: qty 2.5

## 2015-06-23 MED ORDER — ONDANSETRON HCL 8 MG PO TABS
ORAL_TABLET | ORAL | Status: AC
  Filled 2015-06-23: qty 1

## 2015-06-23 MED ORDER — ONDANSETRON HCL 8 MG PO TABS
8.0000 mg | ORAL_TABLET | Freq: Once | ORAL | Status: AC
  Administered 2015-06-23: 8 mg via ORAL

## 2015-06-23 NOTE — Patient Instructions (Signed)
[facility] Discharge Instructions for Patients Receiving Chemotherapy  Today you received the following chemotherapy agents:  Velcade  To help prevent nausea and vomiting after your treatment, we encourage you to take your nausea medication as prescribed.   If you develop nausea and vomiting that is not controlled by your nausea medication, call the clinic.   BELOW ARE SYMPTOMS THAT SHOULD BE REPORTED IMMEDIATELY:  *FEVER GREATER THAN 100.5 F  *CHILLS WITH OR WITHOUT FEVER  NAUSEA AND VOMITING THAT IS NOT CONTROLLED WITH YOUR NAUSEA MEDICATION  *UNUSUAL SHORTNESS OF BREATH  *UNUSUAL BRUISING OR BLEEDING  TENDERNESS IN MOUTH AND THROAT WITH OR WITHOUT PRESENCE OF ULCERS  *URINARY PROBLEMS  *BOWEL PROBLEMS  UNUSUAL RASH Items with * indicate a potential emergency and should be followed up as soon as possible.  Feel free to call the clinic you have any questions or concerns. The clinic phone number is [phone].  Please show the CHEMO ALERT CARD at check-in to the Emergency Department and triage nurse.   

## 2015-06-24 ENCOUNTER — Other Ambulatory Visit: Payer: Self-pay | Admitting: *Deleted

## 2015-06-24 LAB — KAPPA/LAMBDA LIGHT CHAINS, FREE, WITH RATIO, 24HR. URINE
FR KAPPA LT CH,24HR: 26 mg/24 hr
FR LAMBDA LT CH,24HR: 1 mg/24 hr
Free Kappa Lt Chains,Ur: 17.6 mg/L (ref 1.35–24.19)
Free Lambda Lt Chains,Ur: 0.83 mg/L (ref 0.24–6.66)
Kappa/Lambda Ratio,U: 21.2 — ABNORMAL HIGH (ref 2.04–10.37)

## 2015-06-24 MED ORDER — POMALIDOMIDE 1 MG PO CAPS: ORAL_CAPSULE | ORAL | Status: DC

## 2015-07-07 ENCOUNTER — Other Ambulatory Visit: Payer: Self-pay | Admitting: *Deleted

## 2015-07-07 ENCOUNTER — Ambulatory Visit (HOSPITAL_BASED_OUTPATIENT_CLINIC_OR_DEPARTMENT_OTHER): Payer: BC Managed Care – PPO | Admitting: Hematology & Oncology

## 2015-07-07 ENCOUNTER — Other Ambulatory Visit (HOSPITAL_BASED_OUTPATIENT_CLINIC_OR_DEPARTMENT_OTHER): Payer: BC Managed Care – PPO

## 2015-07-07 ENCOUNTER — Encounter: Payer: Self-pay | Admitting: Hematology & Oncology

## 2015-07-07 ENCOUNTER — Ambulatory Visit (HOSPITAL_BASED_OUTPATIENT_CLINIC_OR_DEPARTMENT_OTHER): Payer: BC Managed Care – PPO

## 2015-07-07 VITALS — BP 134/80 | HR 85 | Temp 98.5°F | Resp 18 | Ht 64.0 in | Wt 186.0 lb

## 2015-07-07 DIAGNOSIS — C541 Malignant neoplasm of endometrium: Secondary | ICD-10-CM

## 2015-07-07 DIAGNOSIS — C9001 Multiple myeloma in remission: Secondary | ICD-10-CM

## 2015-07-07 DIAGNOSIS — C9000 Multiple myeloma not having achieved remission: Secondary | ICD-10-CM

## 2015-07-07 DIAGNOSIS — Z5112 Encounter for antineoplastic immunotherapy: Secondary | ICD-10-CM | POA: Diagnosis not present

## 2015-07-07 DIAGNOSIS — C9002 Multiple myeloma in relapse: Secondary | ICD-10-CM

## 2015-07-07 LAB — CBC WITH DIFFERENTIAL (CANCER CENTER ONLY)
BASO#: 0 10*3/uL (ref 0.0–0.2)
BASO%: 1 % (ref 0.0–2.0)
EOS%: 2.8 % (ref 0.0–7.0)
Eosinophils Absolute: 0.1 10*3/uL (ref 0.0–0.5)
HCT: 35.1 % (ref 34.8–46.6)
HGB: 11.9 g/dL (ref 11.6–15.9)
LYMPH#: 1 10*3/uL (ref 0.9–3.3)
LYMPH%: 25.3 % (ref 14.0–48.0)
MCH: 28.1 pg (ref 26.0–34.0)
MCHC: 33.9 g/dL (ref 32.0–36.0)
MCV: 83 fL (ref 81–101)
MONO#: 0.5 10*3/uL (ref 0.1–0.9)
MONO%: 12 % (ref 0.0–13.0)
NEUT#: 2.4 10*3/uL (ref 1.5–6.5)
NEUT%: 58.9 % (ref 39.6–80.0)
Platelets: 234 10*3/uL (ref 145–400)
RBC: 4.23 10*6/uL (ref 3.70–5.32)
RDW: 16.7 % — ABNORMAL HIGH (ref 11.1–15.7)
WBC: 4 10*3/uL (ref 3.9–10.0)

## 2015-07-07 LAB — CMP (CANCER CENTER ONLY)
ALT(SGPT): 27 U/L (ref 10–47)
AST: 22 U/L (ref 11–38)
Albumin: 3.9 g/dL (ref 3.3–5.5)
Alkaline Phosphatase: 41 U/L (ref 26–84)
BUN, Bld: 16 mg/dL (ref 7–22)
CO2: 25 meq/L (ref 18–33)
Calcium: 9.9 mg/dL (ref 8.0–10.3)
Chloride: 104 meq/L (ref 98–108)
Creat: 0.9 mg/dL (ref 0.6–1.2)
Glucose, Bld: 92 mg/dL (ref 73–118)
Potassium: 3.6 meq/L (ref 3.3–4.7)
Sodium: 138 meq/L (ref 128–145)
Total Bilirubin: 0.6 mg/dL (ref 0.20–1.60)
Total Protein: 7.4 g/dL (ref 6.4–8.1)

## 2015-07-07 LAB — LACTATE DEHYDROGENASE: LDH: 178 U/L (ref 125–245)

## 2015-07-07 MED ORDER — ONDANSETRON HCL 8 MG PO TABS
8.0000 mg | ORAL_TABLET | Freq: Once | ORAL | Status: AC
  Administered 2015-07-07: 8 mg via ORAL

## 2015-07-07 MED ORDER — ACYCLOVIR 400 MG PO TABS: 400.0000 mg | ORAL_TABLET | Freq: Two times a day (BID) | ORAL | Status: DC

## 2015-07-07 MED ORDER — BORTEZOMIB CHEMO SQ INJECTION 3.5 MG (2.5MG/ML)
1.2000 mg/m2 | Freq: Once | INTRAMUSCULAR | Status: AC
  Administered 2015-07-07: 2.5 mg via SUBCUTANEOUS
  Filled 2015-07-07: qty 2.5

## 2015-07-07 MED ORDER — ONDANSETRON HCL 8 MG PO TABS
ORAL_TABLET | ORAL | Status: AC
  Filled 2015-07-07: qty 1

## 2015-07-07 NOTE — Progress Notes (Signed)
Hematology and Oncology Follow Up Visit  Emily Greer [id number] May 16, 1950 65 y.o. 07/07/2015   Principle Diagnosis:   Recurrent lambda light chain myeloma  History of recurrent endometrial carcinoma  Current Therapy:    Status post second autologous stem cell transplant on 07/24/2014  Maintenance therapy with Pomalidomide/every 2 week Velcade  Aredia 60 mg IV Q. 3 month - dose on 05/12/2015  Radiation therapy for endometrial recurrence - completed 04/20/2015  Femara 2.5mg  po q day     Interim History:  [name] is back for followup. She looks quite good. She completed her radiation therapy for the recurrent endometrial cancer about  3 months ago. She will get a follow-up scan on this pretty soon. She seemed to tolerate this pretty well.   Her gynecologic oncologist is going to start her on Femara. I did this is very reasonable. I don't see any problems with her bones that she is on Aredia every 3 months.   Her last lambda light chain was 1.6 mg/dL. This is holding pretty steady. A 24 urine was done. This showed very little, if any lambda light chain. There is no monoclonal spike noted in her urine.  She does not see the transplant doctors at [facility] until July.  She is having no problems with bleeding. She's having no problems with changes in bowel or bladder habits. There is no diarrhea.  She's had no leg swelling.   She has tolerated the Pomalidomide and Velcade very nicely.  Overall, her performance status is ECOG 1.  Medications: Allergies:  Allergies  Allergen Reactions  . Codeine Nausea Only    Past Medical History, Surgical history, Social history, and Family History were reviewed and updated.  Review of Systems: As above  Physical Exam:  height is 5\' 4"  (1.626 m) and weight is 186 lb (84.369 kg). Her oral temperature is 98.5 F (36.9 C). Her blood pressure is 134/80 and her pulse is 85. Her respiration is 18.   Well-developed and well-nourished  [name]. Head and neck exam shows no ocular or oral lesion. There are no palpable cervical or supraclavicular lymph nodes. Lungs are clear. Cardiac exam regular rate and rhythm with no murmurs rubs or bruits. Abdomen is soft. She has good bowel sounds. There is no fluid wave. There is no palpable liver or spleen tip. Extremities shows no clubbing, cyanosis or edema. Neurological exam shows no focal neurological deficits. Skin exam no rashes, ecchymosis or petechia.  Lab Results  Component Value Date   WBC 4.0 07/07/2015   HGB 11.9 07/07/2015   HCT 35.1 07/07/2015   MCV 83 07/07/2015   PLT 234 07/07/2015     Chemistry      Component Value Date/Time   NA 139 06/23/2015 1101   NA 141 03/17/2015 1018   NA 139 01/01/2015 0449   K 3.5 06/23/2015 1101   K 3.2* 03/17/2015 1018   K 3.8 01/01/2015 0449   CL 103 06/23/2015 1101   CL 106 01/01/2015 0449   CO2 27 06/23/2015 1101   CO2 25 03/17/2015 1018   CO2 26 01/01/2015 0449   BUN 16 06/23/2015 1101   BUN 21.3 03/17/2015 1018   BUN 15 01/01/2015 0449   CREATININE 1.0 06/23/2015 1101   CREATININE 1.0 03/17/2015 1018   CREATININE 0.99 01/01/2015 0449      Component Value Date/Time   CALCIUM 9.7 06/23/2015 1101   CALCIUM 9.6 03/17/2015 1018   CALCIUM 8.9 01/01/2015 0449   ALKPHOS 38  06/23/2015 1101   ALKPHOS 47 03/17/2015 1018   ALKPHOS 37* 12/29/2014 0011   AST 24 06/23/2015 1101   AST 17 03/17/2015 1018   AST 26 12/29/2014 0011   ALT 23 06/23/2015 1101   ALT 14 03/17/2015 1018   ALT 21 12/29/2014 0011   BILITOT 0.70 06/23/2015 1101   BILITOT 0.54 03/17/2015 1018   BILITOT 0.5 12/29/2014 0011         Impression and Plan: Emily Greer is 65 year old female. She has recurrent lambda light chain myeloma. She also had recurrent endometrial cancer. The recurrence was localized. She is being followed by OB/GYN and radiation oncology for this. She completed radiation therapy back in March. She had 50.4 rad for  this.   We will go ahead with Velcade today. We do this every 2 weeks.  The 24-hour urine is incredibly helpful. The fact that there is no observed monoclonal spike I think is fantastic.  We will plan to get her back in 2 more weeks. pent about 30 minutes with her today.   [name], MD 6/7/20178:55 AM

## 2015-07-07 NOTE — Patient Instructions (Signed)
Bortezomib injection What is this medicine? BORTEZOMIB (bor TEZ oh mib) is a medicine that targets proteins in cancer cells and stops the cancer cells from growing. It is used to treat multiple myeloma and mantle-cell lymphoma. This medicine may be used for other purposes; ask your health care provider or pharmacist if you have questions. What should I tell my health care provider before I take this medicine? They need to know if you have any of these conditions: -diabetes -heart disease -irregular heartbeat -liver disease -on hemodialysis -low blood counts, like low white blood cells, platelets, or hemoglobin -peripheral neuropathy -taking medicine for blood pressure -an unusual or allergic reaction to bortezomib, mannitol, boron, other medicines, foods, dyes, or preservatives -pregnant or trying to get pregnant -breast-feeding How should I use this medicine? This medicine is for injection into a vein or for injection under the skin. It is given by a health care professional in a hospital or clinic setting. Talk to your pediatrician regarding the use of this medicine in children. Special care may be needed. Overdosage: If you think you have taken too much of this medicine contact a poison control center or emergency room at once. NOTE: This medicine is only for you. Do not share this medicine with others. What if I miss a dose? It is important not to miss your dose. Call your doctor or health care professional if you are unable to keep an appointment. What may interact with this medicine? This medicine may interact with the following medications: -ketoconazole -rifampin -ritonavir -St. John's Wort This list may not describe all possible interactions. Give your health care provider a list of all the medicines, herbs, non-prescription drugs, or dietary supplements you use. Also tell them if you smoke, drink alcohol, or use illegal drugs. Some items may interact with your medicine. What  should I watch for while using this medicine? Visit your doctor for checks on your progress. This drug may make you feel generally unwell. This is not uncommon, as chemotherapy can affect healthy cells as well as cancer cells. Report any side effects. Continue your course of treatment even though you feel ill unless your doctor tells you to stop. You may get drowsy or dizzy. Do not drive, use machinery, or do anything that needs mental alertness until you know how this medicine affects you. Do not stand or sit up quickly, especially if you are an older patient. This reduces the risk of dizzy or fainting spells. In some cases, you may be given additional medicines to help with side effects. Follow all directions for their use. Call your doctor or health care professional for advice if you get a fever, chills or sore throat, or other symptoms of a cold or flu. Do not treat yourself. This drug decreases your body's ability to fight infections. Try to avoid being around people who are sick. This medicine may increase your risk to bruise or bleed. Call your doctor or health care professional if you notice any unusual bleeding. You may need blood work done while you are taking this medicine. In some patients, this medicine may cause a serious brain infection that may cause death. If you have any problems seeing, thinking, speaking, walking, or standing, tell your doctor right away. If you cannot reach your doctor, urgently seek other source of medical care. Do not become pregnant while taking this medicine. Women should inform their doctor if they wish to become pregnant or think they might be pregnant. There is a potential for serious  side effects to an unborn child. Talk to your health care professional or pharmacist for more information. Do not breast-feed an infant while taking this medicine. Check with your doctor or health care professional if you get an attack of severe diarrhea, nausea and vomiting, or if  you sweat a lot. The loss of too much body fluid can make it dangerous for you to take this medicine. What side effects may I notice from receiving this medicine? Side effects that you should report to your doctor or health care professional as soon as possible: -allergic reactions like skin rash, itching or hives, swelling of the face, lips, or tongue -breathing problems -changes in hearing -changes in vision -fast, irregular heartbeat -feeling faint or lightheaded, falls -pain, tingling, numbness in the hands or feet -right upper belly pain -seizures -swelling of the ankles, feet, hands -unusual bleeding or bruising -unusually weak or tired -vomiting -yellowing of the eyes or skin Side effects that usually do not require medical attention (report to your doctor or health care professional if they continue or are bothersome): -changes in emotions or moods -constipation -diarrhea -loss of appetite -headache -irritation at site where injected -nausea This list may not describe all possible side effects. Call your doctor for medical advice about side effects. You may report side effects to FDA at 1-800-FDA-1088. Where should I keep my medicine? This drug is given in a hospital or clinic and will not be stored at home. NOTE: This sheet is a summary. It may not cover all possible information. If you have questions about this medicine, talk to your doctor, pharmacist, or health care provider.    2016, Elsevier/Gold Standard. (2014-03-17 14:47:04)

## 2015-07-08 LAB — IGG, IGA, IGM
IgA, Qn, Serum: 166 mg/dL (ref 87–352)
IgG, Qn, Serum: 1140 mg/dL (ref 700–1600)
IgM, Qn, Serum: 31 mg/dL (ref 26–217)

## 2015-07-08 LAB — KAPPA/LAMBDA LIGHT CHAINS
Ig Kappa Free Light Chain: 20 mg/L — ABNORMAL HIGH (ref 3.3–19.4)
Ig Lambda Free Light Chain: 13.8 mg/L (ref 5.7–26.3)
Kappa/Lambda FluidC Ratio: 1.45 (ref 0.26–1.65)

## 2015-07-12 LAB — PROTEIN ELECTROPHORESIS, SERUM, WITH REFLEX
A/G Ratio: 1.3 (ref 0.7–1.7)
Albumin: 4 g/dL (ref 2.9–4.4)
Alpha 1: 0.2 g/dL (ref 0.0–0.4)
Alpha 2: 0.8 g/dL (ref 0.4–1.0)
Beta: 1.2 g/dL (ref 0.7–1.3)
Gamma Globulin: 1.1 g/dL (ref 0.4–1.8)
Globulin, Total: 3.2 g/dL (ref 2.2–3.9)
Interpretation(See Below): 0
M-Spike, %: 0.2 g/dL — ABNORMAL HIGH
Total Protein: 7.2 g/dL (ref 6.0–8.5)

## 2015-07-13 ENCOUNTER — Other Ambulatory Visit: Payer: Self-pay | Admitting: Gynecologic Oncology

## 2015-07-13 DIAGNOSIS — C7982 Secondary malignant neoplasm of genital organs: Secondary | ICD-10-CM

## 2015-07-13 DIAGNOSIS — C541 Malignant neoplasm of endometrium: Secondary | ICD-10-CM

## 2015-07-13 NOTE — Progress Notes (Signed)
CT CAP ordered since PET scan denied.

## 2015-07-15 ENCOUNTER — Other Ambulatory Visit: Payer: Self-pay | Admitting: Nurse Practitioner

## 2015-07-15 ENCOUNTER — Other Ambulatory Visit: Payer: Self-pay | Admitting: Hematology & Oncology

## 2015-07-15 MED ORDER — POMALIDOMIDE 1 MG PO CAPS: ORAL_CAPSULE | ORAL | Status: DC

## 2015-07-21 ENCOUNTER — Ambulatory Visit (HOSPITAL_BASED_OUTPATIENT_CLINIC_OR_DEPARTMENT_OTHER): Payer: BC Managed Care – PPO

## 2015-07-21 ENCOUNTER — Other Ambulatory Visit (HOSPITAL_BASED_OUTPATIENT_CLINIC_OR_DEPARTMENT_OTHER): Payer: BC Managed Care – PPO

## 2015-07-21 ENCOUNTER — Other Ambulatory Visit: Payer: BC Managed Care – PPO

## 2015-07-21 VITALS — BP 123/76 | HR 67 | Temp 97.9°F | Resp 16

## 2015-07-21 DIAGNOSIS — Z5112 Encounter for antineoplastic immunotherapy: Secondary | ICD-10-CM

## 2015-07-21 DIAGNOSIS — C9001 Multiple myeloma in remission: Secondary | ICD-10-CM

## 2015-07-21 DIAGNOSIS — C9000 Multiple myeloma not having achieved remission: Secondary | ICD-10-CM

## 2015-07-21 LAB — CBC WITH DIFFERENTIAL (CANCER CENTER ONLY)
BASO#: 0 10*3/uL (ref 0.0–0.2)
BASO%: 0.7 % (ref 0.0–2.0)
EOS%: 2.5 % (ref 0.0–7.0)
Eosinophils Absolute: 0.1 10*3/uL (ref 0.0–0.5)
HCT: 33.9 % — ABNORMAL LOW (ref 34.8–46.6)
HGB: 11.4 g/dL — ABNORMAL LOW (ref 11.6–15.9)
LYMPH#: 1 10*3/uL (ref 0.9–3.3)
LYMPH%: 24.3 % (ref 14.0–48.0)
MCH: 28.1 pg (ref 26.0–34.0)
MCHC: 33.6 g/dL (ref 32.0–36.0)
MCV: 84 fL (ref 81–101)
MONO#: 0.6 10*3/uL (ref 0.1–0.9)
MONO%: 13.7 % — ABNORMAL HIGH (ref 0.0–13.0)
NEUT#: 2.4 10*3/uL (ref 1.5–6.5)
NEUT%: 58.8 % (ref 39.6–80.0)
Platelets: 201 10*3/uL (ref 145–400)
RBC: 4.06 10*6/uL (ref 3.70–5.32)
RDW: 16.6 % — ABNORMAL HIGH (ref 11.1–15.7)
WBC: 4.1 10*3/uL (ref 3.9–10.0)

## 2015-07-21 LAB — CMP (CANCER CENTER ONLY)
ALT(SGPT): 25 U/L (ref 10–47)
AST: 20 U/L (ref 11–38)
Albumin: 3.8 g/dL (ref 3.3–5.5)
Alkaline Phosphatase: 39 U/L (ref 26–84)
BUN, Bld: 23 mg/dL — ABNORMAL HIGH (ref 7–22)
CO2: 28 mEq/L (ref 18–33)
Calcium: 9.3 mg/dL (ref 8.0–10.3)
Chloride: 103 mEq/L (ref 98–108)
Creat: 1.3 mg/dl — ABNORMAL HIGH (ref 0.6–1.2)
Glucose, Bld: 85 mg/dL (ref 73–118)
Potassium: 4 mEq/L (ref 3.3–4.7)
Sodium: 135 mEq/L (ref 128–145)
Total Bilirubin: 0.6 mg/dl (ref 0.20–1.60)
Total Protein: 7.5 g/dL (ref 6.4–8.1)

## 2015-07-21 MED ORDER — ONDANSETRON HCL 8 MG PO TABS
ORAL_TABLET | ORAL | Status: AC
  Filled 2015-07-21: qty 1

## 2015-07-21 MED ORDER — ONDANSETRON HCL 8 MG PO TABS
8.0000 mg | ORAL_TABLET | Freq: Once | ORAL | Status: AC
  Administered 2015-07-21: 8 mg via ORAL

## 2015-07-21 MED ORDER — BORTEZOMIB CHEMO SQ INJECTION 3.5 MG (2.5MG/ML)
1.2000 mg/m2 | Freq: Once | INTRAMUSCULAR | Status: AC
  Administered 2015-07-21: 2.5 mg via SUBCUTANEOUS
  Filled 2015-07-21: qty 2.5

## 2015-07-21 NOTE — Patient Instructions (Signed)
Bortezomib injection What is this medicine? BORTEZOMIB (bor TEZ oh mib) is a medicine that targets proteins in cancer cells and stops the cancer cells from growing. It is used to treat multiple myeloma and mantle-cell lymphoma. This medicine may be used for other purposes; ask your health care provider or pharmacist if you have questions. What should I tell my health care provider before I take this medicine? They need to know if you have any of these conditions: -diabetes -heart disease -irregular heartbeat -liver disease -on hemodialysis -low blood counts, like low white blood cells, platelets, or hemoglobin -peripheral neuropathy -taking medicine for blood pressure -an unusual or allergic reaction to bortezomib, mannitol, boron, other medicines, foods, dyes, or preservatives -pregnant or trying to get pregnant -breast-feeding How should I use this medicine? This medicine is for injection into a vein or for injection under the skin. It is given by a health care professional in a hospital or clinic setting. Talk to your pediatrician regarding the use of this medicine in children. Special care may be needed. Overdosage: If you think you have taken too much of this medicine contact a poison control center or emergency room at once. NOTE: This medicine is only for you. Do not share this medicine with others. What if I miss a dose? It is important not to miss your dose. Call your doctor or health care professional if you are unable to keep an appointment. What may interact with this medicine? This medicine may interact with the following medications: -ketoconazole -rifampin -ritonavir -St. John's Wort This list may not describe all possible interactions. Give your health care provider a list of all the medicines, herbs, non-prescription drugs, or dietary supplements you use. Also tell them if you smoke, drink alcohol, or use illegal drugs. Some items may interact with your medicine. What  should I watch for while using this medicine? Visit your doctor for checks on your progress. This drug may make you feel generally unwell. This is not uncommon, as chemotherapy can affect healthy cells as well as cancer cells. Report any side effects. Continue your course of treatment even though you feel ill unless your doctor tells you to stop. You may get drowsy or dizzy. Do not drive, use machinery, or do anything that needs mental alertness until you know how this medicine affects you. Do not stand or sit up quickly, especially if you are an older patient. This reduces the risk of dizzy or fainting spells. In some cases, you may be given additional medicines to help with side effects. Follow all directions for their use. Call your doctor or health care professional for advice if you get a fever, chills or sore throat, or other symptoms of a cold or flu. Do not treat yourself. This drug decreases your body's ability to fight infections. Try to avoid being around people who are sick. This medicine may increase your risk to bruise or bleed. Call your doctor or health care professional if you notice any unusual bleeding. You may need blood work done while you are taking this medicine. In some patients, this medicine may cause a serious brain infection that may cause death. If you have any problems seeing, thinking, speaking, walking, or standing, tell your doctor right away. If you cannot reach your doctor, urgently seek other source of medical care. Do not become pregnant while taking this medicine. Women should inform their doctor if they wish to become pregnant or think they might be pregnant. There is a potential for serious  side effects to an unborn child. Talk to your health care professional or pharmacist for more information. Do not breast-feed an infant while taking this medicine. Check with your doctor or health care professional if you get an attack of severe diarrhea, nausea and vomiting, or if  you sweat a lot. The loss of too much body fluid can make it dangerous for you to take this medicine. What side effects may I notice from receiving this medicine? Side effects that you should report to your doctor or health care professional as soon as possible: -allergic reactions like skin rash, itching or hives, swelling of the face, lips, or tongue -breathing problems -changes in hearing -changes in vision -fast, irregular heartbeat -feeling faint or lightheaded, falls -pain, tingling, numbness in the hands or feet -right upper belly pain -seizures -swelling of the ankles, feet, hands -unusual bleeding or bruising -unusually weak or tired -vomiting -yellowing of the eyes or skin Side effects that usually do not require medical attention (report to your doctor or health care professional if they continue or are bothersome): -changes in emotions or moods -constipation -diarrhea -loss of appetite -headache -irritation at site where injected -nausea This list may not describe all possible side effects. Call your doctor for medical advice about side effects. You may report side effects to FDA at 1-800-FDA-1088. Where should I keep my medicine? This drug is given in a hospital or clinic and will not be stored at home. NOTE: This sheet is a summary. It may not cover all possible information. If you have questions about this medicine, talk to your doctor, pharmacist, or health care provider.    2016, Elsevier/Gold Standard. (2014-03-17 14:47:04)

## 2015-07-22 LAB — IGG, IGA, IGM
IgA, Qn, Serum: 157 mg/dL (ref 87–352)
IgG, Qn, Serum: 1008 mg/dL (ref 700–1600)
IgM, Qn, Serum: 31 mg/dL (ref 26–217)

## 2015-07-22 LAB — KAPPA/LAMBDA LIGHT CHAINS
Ig Kappa Free Light Chain: 21.7 mg/L — ABNORMAL HIGH (ref 3.3–19.4)
Ig Lambda Free Light Chain: 15.3 mg/L (ref 5.7–26.3)
Kappa/Lambda FluidC Ratio: 1.42 (ref 0.26–1.65)

## 2015-07-23 ENCOUNTER — Telehealth: Payer: Self-pay | Admitting: *Deleted

## 2015-07-23 LAB — PROTEIN ELECTROPHORESIS, SERUM, WITH REFLEX
A/G Ratio: 1.3 (ref 0.7–1.7)
Albumin: 4 g/dL (ref 2.9–4.4)
Alpha 1: 0.2 g/dL (ref 0.0–0.4)
Alpha 2: 0.7 g/dL (ref 0.4–1.0)
Beta: 1.2 g/dL (ref 0.7–1.3)
Gamma Globulin: 1.1 g/dL (ref 0.4–1.8)
Globulin, Total: 3.1 g/dL (ref 2.2–3.9)
Interpretation(See Below): 0
M-Spike, %: 0.2 g/dL — ABNORMAL HIGH
Total Protein: 7.1 g/dL (ref 6.0–8.5)

## 2015-07-23 NOTE — Telephone Encounter (Addendum)
Patient aware of results.   ---- Message from [name], MD sent at 07/22/2015  5:45 PM EDT ----- Call - myeloma is holding low but stable!!!  pete

## 2015-07-26 ENCOUNTER — Ambulatory Visit (HOSPITAL_COMMUNITY): Payer: BC Managed Care – PPO

## 2015-07-26 ENCOUNTER — Encounter (HOSPITAL_COMMUNITY): Payer: Self-pay

## 2015-07-26 ENCOUNTER — Ambulatory Visit (HOSPITAL_COMMUNITY)
Admission: RE | Admit: 2015-07-26 | Discharge: 2015-07-26 | Disposition: A | Discharge status: Home / Self Care | Payer: BC Managed Care – PPO | Source: Ambulatory Visit | Attending: Gynecologic Oncology | Admitting: Gynecologic Oncology

## 2015-07-26 DIAGNOSIS — C9000 Multiple myeloma not having achieved remission: Secondary | ICD-10-CM | POA: Diagnosis not present

## 2015-07-26 DIAGNOSIS — E041 Nontoxic single thyroid nodule: Secondary | ICD-10-CM | POA: Diagnosis not present

## 2015-07-26 DIAGNOSIS — C7982 Secondary malignant neoplasm of genital organs: Secondary | ICD-10-CM | POA: Diagnosis present

## 2015-07-26 DIAGNOSIS — M879 Osteonecrosis, unspecified: Secondary | ICD-10-CM | POA: Insufficient documentation

## 2015-07-26 DIAGNOSIS — C541 Malignant neoplasm of endometrium: Secondary | ICD-10-CM | POA: Diagnosis present

## 2015-07-26 MED ORDER — IOPAMIDOL (ISOVUE-300) INJECTION 61%
100.0000 mL | Freq: Once | INTRAVENOUS | Status: AC | PRN
  Administered 2015-07-26: 100 mL via INTRAVENOUS

## 2015-07-27 ENCOUNTER — Telehealth: Payer: Self-pay | Admitting: Gynecologic Oncology

## 2015-07-27 NOTE — Telephone Encounter (Signed)
Patient informed of CT scan results.  Wanting to reschedule her appt with Dr. Skeet Latch until July 21 since she is doing well.  Advised to call for any needs or concerns.

## 2015-07-29 ENCOUNTER — Ambulatory Visit: Payer: BC Managed Care – PPO | Admitting: Gynecologic Oncology

## 2015-08-04 ENCOUNTER — Ambulatory Visit: Payer: BC Managed Care – PPO | Admitting: Hematology & Oncology

## 2015-08-04 ENCOUNTER — Ambulatory Visit: Payer: BC Managed Care – PPO

## 2015-08-04 ENCOUNTER — Other Ambulatory Visit: Payer: BC Managed Care – PPO

## 2015-08-11 ENCOUNTER — Other Ambulatory Visit (HOSPITAL_BASED_OUTPATIENT_CLINIC_OR_DEPARTMENT_OTHER): Payer: BC Managed Care – PPO

## 2015-08-11 ENCOUNTER — Ambulatory Visit (HOSPITAL_BASED_OUTPATIENT_CLINIC_OR_DEPARTMENT_OTHER): Payer: BC Managed Care – PPO

## 2015-08-11 ENCOUNTER — Encounter: Payer: Self-pay | Admitting: Hematology & Oncology

## 2015-08-11 ENCOUNTER — Ambulatory Visit (HOSPITAL_BASED_OUTPATIENT_CLINIC_OR_DEPARTMENT_OTHER): Payer: BC Managed Care – PPO | Admitting: Hematology & Oncology

## 2015-08-11 VITALS — BP 133/79 | HR 78 | Temp 97.8°F | Resp 20

## 2015-08-11 DIAGNOSIS — C9001 Multiple myeloma in remission: Secondary | ICD-10-CM

## 2015-08-11 DIAGNOSIS — Z5112 Encounter for antineoplastic immunotherapy: Secondary | ICD-10-CM | POA: Diagnosis not present

## 2015-08-11 DIAGNOSIS — C541 Malignant neoplasm of endometrium: Secondary | ICD-10-CM

## 2015-08-11 DIAGNOSIS — C9000 Multiple myeloma not having achieved remission: Secondary | ICD-10-CM

## 2015-08-11 DIAGNOSIS — Z8579 Personal history of other malignant neoplasms of lymphoid, hematopoietic and related tissues: Secondary | ICD-10-CM

## 2015-08-11 LAB — CBC WITH DIFFERENTIAL (CANCER CENTER ONLY)
BASO#: 0.1 10*3/uL (ref 0.0–0.2)
BASO%: 1.5 % (ref 0.0–2.0)
EOS%: 1.8 % (ref 0.0–7.0)
Eosinophils Absolute: 0.1 10*3/uL (ref 0.0–0.5)
HCT: 32.7 % — ABNORMAL LOW (ref 34.8–46.6)
HGB: 11 g/dL — ABNORMAL LOW (ref 11.6–15.9)
LYMPH#: 1.1 10*3/uL (ref 0.9–3.3)
LYMPH%: 28.5 % (ref 14.0–48.0)
MCH: 28.1 pg (ref 26.0–34.0)
MCHC: 33.6 g/dL (ref 32.0–36.0)
MCV: 84 fL (ref 81–101)
MONO#: 0.4 10*3/uL (ref 0.1–0.9)
MONO%: 10.9 % (ref 0.0–13.0)
NEUT#: 2.3 10*3/uL (ref 1.5–6.5)
NEUT%: 57.3 % (ref 39.6–80.0)
Platelets: 223 10*3/uL (ref 145–400)
RBC: 3.91 10*6/uL (ref 3.70–5.32)
RDW: 16 % — ABNORMAL HIGH (ref 11.1–15.7)
WBC: 4 10*3/uL (ref 3.9–10.0)

## 2015-08-11 LAB — LACTATE DEHYDROGENASE: LDH: 160 U/L (ref 125–245)

## 2015-08-11 LAB — CMP (CANCER CENTER ONLY)
ALT(SGPT): 26 U/L (ref 10–47)
AST: 20 U/L (ref 11–38)
Albumin: 3.6 g/dL (ref 3.3–5.5)
Alkaline Phosphatase: 36 U/L (ref 26–84)
BUN, Bld: 17 mg/dL (ref 7–22)
CO2: 26 mEq/L (ref 18–33)
Calcium: 9.3 mg/dL (ref 8.0–10.3)
Chloride: 102 mEq/L (ref 98–108)
Creat: 0.8 mg/dl (ref 0.6–1.2)
Glucose, Bld: 90 mg/dL (ref 73–118)
Potassium: 3.5 mEq/L (ref 3.3–4.7)
Sodium: 134 mEq/L (ref 128–145)
Total Bilirubin: 0.6 mg/dl (ref 0.20–1.60)
Total Protein: 7 g/dL (ref 6.4–8.1)

## 2015-08-11 MED ORDER — BORTEZOMIB CHEMO SQ INJECTION 3.5 MG (2.5MG/ML)
1.2000 mg/m2 | Freq: Once | INTRAMUSCULAR | Status: AC
  Administered 2015-08-11: 2.5 mg via SUBCUTANEOUS
  Filled 2015-08-11: qty 2.5

## 2015-08-11 MED ORDER — ONDANSETRON HCL 8 MG PO TABS
ORAL_TABLET | ORAL | Status: AC
  Filled 2015-08-11: qty 1

## 2015-08-11 MED ORDER — SODIUM CHLORIDE 0.9 % IV SOLN
90.0000 mg | Freq: Once | INTRAVENOUS | Status: AC
  Administered 2015-08-11: 90 mg via INTRAVENOUS
  Filled 2015-08-11: qty 10

## 2015-08-11 MED ORDER — ONDANSETRON HCL 8 MG PO TABS
8.0000 mg | ORAL_TABLET | Freq: Once | ORAL | Status: AC
  Administered 2015-08-11: 8 mg via ORAL

## 2015-08-11 NOTE — Patient Instructions (Signed)
Bortezomib injection What is this medicine? BORTEZOMIB (bor TEZ oh mib) is a medicine that targets proteins in cancer cells and stops the cancer cells from growing. It is used to treat multiple myeloma and mantle-cell lymphoma. This medicine may be used for other purposes; ask your health care provider or pharmacist if you have questions. What should I tell my health care provider before I take this medicine? They need to know if you have any of these conditions: -diabetes -heart disease -irregular heartbeat -liver disease -on hemodialysis -low blood counts, like low white blood cells, platelets, or hemoglobin -peripheral neuropathy -taking medicine for blood pressure -an unusual or allergic reaction to bortezomib, mannitol, boron, other medicines, foods, dyes, or preservatives -pregnant or trying to get pregnant -breast-feeding How should I use this medicine? This medicine is for injection into a vein or for injection under the skin. It is given by a health care professional in a hospital or clinic setting. Talk to your pediatrician regarding the use of this medicine in children. Special care may be needed. Overdosage: If you think you have taken too much of this medicine contact a poison control center or emergency room at once. NOTE: This medicine is only for you. Do not share this medicine with others. What if I miss a dose? It is important not to miss your dose. Call your doctor or health care professional if you are unable to keep an appointment. What may interact with this medicine? This medicine may interact with the following medications: -ketoconazole -rifampin -ritonavir -St. John's Wort This list may not describe all possible interactions. Give your health care provider a list of all the medicines, herbs, non-prescription drugs, or dietary supplements you use. Also tell them if you smoke, drink alcohol, or use illegal drugs. Some items may interact with your medicine. What  should I watch for while using this medicine? Visit your doctor for checks on your progress. This drug may make you feel generally unwell. This is not uncommon, as chemotherapy can affect healthy cells as well as cancer cells. Report any side effects. Continue your course of treatment even though you feel ill unless your doctor tells you to stop. You may get drowsy or dizzy. Do not drive, use machinery, or do anything that needs mental alertness until you know how this medicine affects you. Do not stand or sit up quickly, especially if you are an older patient. This reduces the risk of dizzy or fainting spells. In some cases, you may be given additional medicines to help with side effects. Follow all directions for their use. Call your doctor or health care professional for advice if you get a fever, chills or sore throat, or other symptoms of a cold or flu. Do not treat yourself. This drug decreases your body's ability to fight infections. Try to avoid being around people who are sick. This medicine may increase your risk to bruise or bleed. Call your doctor or health care professional if you notice any unusual bleeding. You may need blood work done while you are taking this medicine. In some patients, this medicine may cause a serious brain infection that may cause death. If you have any problems seeing, thinking, speaking, walking, or standing, tell your doctor right away. If you cannot reach your doctor, urgently seek other source of medical care. Do not become pregnant while taking this medicine. Women should inform their doctor if they wish to become pregnant or think they might be pregnant. There is a potential for serious  side effects to an unborn child. Talk to your health care professional or pharmacist for more information. Do not breast-feed an infant while taking this medicine. Check with your doctor or health care professional if you get an attack of severe diarrhea, nausea and vomiting, or if  you sweat a lot. The loss of too much body fluid can make it dangerous for you to take this medicine. What side effects may I notice from receiving this medicine? Side effects that you should report to your doctor or health care professional as soon as possible: -allergic reactions like skin rash, itching or hives, swelling of the face, lips, or tongue -breathing problems -changes in hearing -changes in vision -fast, irregular heartbeat -feeling faint or lightheaded, falls -pain, tingling, numbness in the hands or feet -right upper belly pain -seizures -swelling of the ankles, feet, hands -unusual bleeding or bruising -unusually weak or tired -vomiting -yellowing of the eyes or skin Side effects that usually do not require medical attention (report to your doctor or health care professional if they continue or are bothersome): -changes in emotions or moods -constipation -diarrhea -loss of appetite -headache -irritation at site where injected -nausea This list may not describe all possible side effects. Call your doctor for medical advice about side effects. You may report side effects to FDA at 1-800-FDA-1088. Where should I keep my medicine? This drug is given in a hospital or clinic and will not be stored at home. NOTE: This sheet is a summary. It may not cover all possible information. If you have questions about this medicine, talk to your doctor, pharmacist, or health care provider.    2016, Elsevier/Gold Standard. (2014-03-17 14:47:04) Pamidronate injection What is this medicine? PAMIDRONATE (pa mi DROE nate) slows calcium loss from bones. It is used to treat high calcium blood levels from cancer or Paget's disease. It is also used to treat bone pain and prevent fractures from certain cancers that have spread to the bone. This medicine may be used for other purposes; ask your health care provider or pharmacist if you have questions. What should I tell my health care provider  before I take this medicine? They need to know if you have any of these conditions: -aspirin-sensitive asthma -dental disease -kidney disease -an unusual or allergic reaction to pamidronate, other medicines, foods, dyes, or preservatives -pregnant or trying to get pregnant -breast-feeding How should I use this medicine? This medicine is for infusion into a vein. It is given by a health care professional in a hospital or clinic setting. Talk to your pediatrician regarding the use of this medicine in children. This medicine is not approved for use in children. Overdosage: If you think you have taken too much of this medicine contact a poison control center or emergency room at once. NOTE: This medicine is only for you. Do not share this medicine with others. What if I miss a dose? This does not apply. What may interact with this medicine? -certain antibiotics given by injection -medicines for inflammation or pain like ibuprofen, naproxen -some diuretics like bumetanide, furosemide -cyclosporine -parathyroid hormone -tacrolimus -teriparatide -thalidomide This list may not describe all possible interactions. Give your health care provider a list of all the medicines, herbs, non-prescription drugs, or dietary supplements you use. Also tell them if you smoke, drink alcohol, or use illegal drugs. Some items may interact with your medicine. What should I watch for while using this medicine? Visit your doctor or health care professional for regular checkups. It may be  some time before you see the benefit from this medicine. Do not stop taking your medicine unless your doctor tells you to. Your doctor may order blood tests or other tests to see how you are doing. Women should inform their doctor if they wish to become pregnant or think they might be pregnant. There is a potential for serious side effects to an unborn child. Talk to your health care professional or pharmacist for more  information. You should make sure that you get enough calcium and vitamin D while you are taking this medicine. Discuss the foods you eat and the vitamins you take with your health care professional. Some people who take this medicine have severe bone, joint, and/or muscle pain. This medicine may also increase your risk for a broken thigh bone. Tell your doctor right away if you have pain in your upper leg or groin. Tell your doctor if you have any pain that does not go away or that gets worse. What side effects may I notice from receiving this medicine? Side effects that you should report to your doctor or health care professional as soon as possible: -allergic reactions like skin rash, itching or hives, swelling of the face, lips, or tongue -black or tarry stools -changes in vision -eye inflammation, pain -high blood pressure -jaw pain, especially burning or cramping -muscle weakness -numb, tingling pain -swelling of feet or hands -trouble passing urine or change in the amount of urine -unable to move easily Side effects that usually do not require medical attention (report to your doctor or health care professional if they continue or are bothersome): -bone, joint, or muscle pain -constipation -dizzy, drowsy -fever -headache -loss of appetite -nausea, vomiting -pain at site where injected This list may not describe all possible side effects. Call your doctor for medical advice about side effects. You may report side effects to FDA at 1-800-FDA-1088. Where should I keep my medicine? This drug is given in a hospital or clinic and will not be stored at home. NOTE: This sheet is a summary. It may not cover all possible information. If you have questions about this medicine, talk to your doctor, pharmacist, or health care provider.    2016, Elsevier/Gold Standard. (2010-07-15 08:49:49)

## 2015-08-12 ENCOUNTER — Other Ambulatory Visit: Payer: Self-pay | Admitting: Hematology & Oncology

## 2015-08-17 ENCOUNTER — Other Ambulatory Visit: Payer: Self-pay | Admitting: Nurse Practitioner

## 2015-08-17 MED ORDER — POMALIDOMIDE 1 MG PO CAPS: ORAL_CAPSULE | ORAL | Status: DC

## 2015-08-18 NOTE — Progress Notes (Signed)
Hematology and Oncology Follow Up Visit  Emily Greer [id number] Jun 22, 1950 65 y.o. 08/18/2015   Principle Diagnosis:   Recurrent lambda light chain myeloma  History of recurrent endometrial carcinoma  Current Therapy:    Status post second autologous stem cell transplant on 07/24/2014  Maintenance therapy with Pomalidomide/every 2 week Velcade  Aredia 60 mg IV Q. 3 month - dose on 05/12/2015  Radiation therapy for endometrial recurrence - completed 04/20/2015  Femara 2.5mg  po q day     Interim History:  [name] is back for followup. She looks quite good. She completed her radiation therapy for the recurrent endometrial cancer about  4 months ago. She had a follow-up CT scan in late June. This did not show any evidence of metastatic endometrial cancer. She had stable bony issues related to myeloma.   Her gynecologic oncologist has started her on Femara. I think that this is very reasonable. I don't see any problems with her bones. She is on Aredia every 3 months.   Her last lambda light chain was 1.5 mg/dL. This is holding pretty steady. A 24 urine was done. This showed very little, if any lambda light chain. There is no monoclonal spike noted in her urine.  She will see the transplant doctors at [facility] later this month..  She is having no problems with bleeding. She's having no problems with changes in bowel or bladder habits. There is no diarrhea.  She's had no leg swelling.   She has tolerated the Pomalidomide and Velcade very nicely.  Overall, her performance status is ECOG 1.  Medications: Allergies:  Allergies  Allergen Reactions  . Codeine Nausea Only    Past Medical History, Surgical history, Social history, and Family History were reviewed and updated.  Review of Systems: As above  Physical Exam:  oral temperature is 97.8 F (36.6 C). Her blood pressure is 133/79 and her pulse is 78. Her respiration is 20.   Well-developed and well-nourished  [name]. Head and neck exam shows no ocular or oral lesion. There are no palpable cervical or supraclavicular lymph nodes. Lungs are clear. Cardiac exam regular rate and rhythm with no murmurs rubs or bruits. Abdomen is soft. She has good bowel sounds. There is no fluid wave. There is no palpable liver or spleen tip. Extremities shows no clubbing, cyanosis or edema. Neurological exam shows no focal neurological deficits. Skin exam no rashes, ecchymosis or petechia.  Lab Results  Component Value Date   WBC 4.0 08/11/2015   HGB 11.0* 08/11/2015   HCT 32.7* 08/11/2015   MCV 84 08/11/2015   PLT 223 08/11/2015     Chemistry      Component Value Date/Time   NA 134 08/11/2015 1006   NA 141 03/17/2015 1018   NA 139 01/01/2015 0449   K 3.5 08/11/2015 1006   K 3.2* 03/17/2015 1018   K 3.8 01/01/2015 0449   CL 102 08/11/2015 1006   CL 106 01/01/2015 0449   CO2 26 08/11/2015 1006   CO2 25 03/17/2015 1018   CO2 26 01/01/2015 0449   BUN 17 08/11/2015 1006   BUN 21.3 03/17/2015 1018   BUN 15 01/01/2015 0449   CREATININE 0.8 08/11/2015 1006   CREATININE 1.0 03/17/2015 1018   CREATININE 0.99 01/01/2015 0449      Component Value Date/Time   CALCIUM 9.3 08/11/2015 1006   CALCIUM 9.6 03/17/2015 1018   CALCIUM 8.9 01/01/2015 0449   ALKPHOS 36 08/11/2015 1006   ALKPHOS 47  03/17/2015 1018   ALKPHOS 37* 12/29/2014 0011   AST 20 08/11/2015 1006   AST 17 03/17/2015 1018   AST 26 12/29/2014 0011   ALT 26 08/11/2015 1006   ALT 14 03/17/2015 1018   ALT 21 12/29/2014 0011   BILITOT 0.60 08/11/2015 1006   BILITOT 0.54 03/17/2015 1018   BILITOT 0.5 12/29/2014 0011         Impression and Plan: Emily Greer is 65 year old female. She has recurrent lambda light chain myeloma. She also had recurrent endometrial cancer. The recurrence was localized. She is being followed by OB/GYN and radiation oncology for this. She completed radiation therapy back in March. She had 50.4 rad for  this.   We will go ahead with Velcade today. We do this every 2 weeks.  The 24-hour urine is incredibly helpful. The fact that there is no observed monoclonal spike I think is fantastic.  We will plan to get her back in 2 more weeks. I spent about 30 minutes with her today.   [name], MD 7/19/20178:15 AM

## 2015-08-19 ENCOUNTER — Encounter: Payer: Self-pay | Admitting: Gynecologic Oncology

## 2015-08-19 ENCOUNTER — Ambulatory Visit: Payer: BC Managed Care – PPO | Attending: Gynecologic Oncology | Admitting: Gynecologic Oncology

## 2015-08-19 VITALS — BP 130/70 | HR 79 | Temp 98.0°F | Resp 18 | Ht 64.0 in | Wt 184.0 lb

## 2015-08-19 DIAGNOSIS — Z79811 Long term (current) use of aromatase inhibitors: Secondary | ICD-10-CM | POA: Diagnosis not present

## 2015-08-19 DIAGNOSIS — C541 Malignant neoplasm of endometrium: Secondary | ICD-10-CM | POA: Diagnosis not present

## 2015-08-19 DIAGNOSIS — Z9481 Bone marrow transplant status: Secondary | ICD-10-CM

## 2015-08-19 DIAGNOSIS — Z923 Personal history of irradiation: Secondary | ICD-10-CM | POA: Insufficient documentation

## 2015-08-19 DIAGNOSIS — Z90710 Acquired absence of both cervix and uterus: Secondary | ICD-10-CM | POA: Insufficient documentation

## 2015-08-19 DIAGNOSIS — M810 Age-related osteoporosis without current pathological fracture: Secondary | ICD-10-CM | POA: Diagnosis not present

## 2015-08-19 DIAGNOSIS — M879 Osteonecrosis, unspecified: Secondary | ICD-10-CM | POA: Diagnosis not present

## 2015-08-19 DIAGNOSIS — Z90722 Acquired absence of ovaries, bilateral: Secondary | ICD-10-CM | POA: Diagnosis not present

## 2015-08-19 DIAGNOSIS — Z8619 Personal history of other infectious and parasitic diseases: Secondary | ICD-10-CM | POA: Insufficient documentation

## 2015-08-19 DIAGNOSIS — Z8579 Personal history of other malignant neoplasms of lymphoid, hematopoietic and related tissues: Secondary | ICD-10-CM | POA: Insufficient documentation

## 2015-08-19 DIAGNOSIS — I10 Essential (primary) hypertension: Secondary | ICD-10-CM | POA: Insufficient documentation

## 2015-08-19 DIAGNOSIS — C9001 Multiple myeloma in remission: Secondary | ICD-10-CM

## 2015-08-19 NOTE — Consult Note (Signed)
See progress note.

## 2015-08-19 NOTE — Patient Instructions (Signed)
Plan to follow up with Dr. Leo Grosser in October 2017 with a pap smear at that visit.  Plan to see Dr. Skeet Latch in Jan 2018 or sooner if needed.  Please call our office at [phone] after you see Dr. Leo Grosser to schedule your appointment for Jan with Dr. Skeet Latch.  You can use coconut oil for lubrication with intercourse.  Please call for any questions or concerns.

## 2015-08-19 NOTE — Progress Notes (Signed)
Office Visit:  GYN ONCOLOGY  CC:   Recurrent endometrial cancer    Assessment:   65 y.o.  year old with Stage1A Grade 2 endometrioid endometrial cancer staged 10/2011 with recurrence at the distal vagina identified in April 2014.  Subsequent PET scan was negative for evidence of metastatic disease and Emily Kisner WoodwardCompleted vaginal  brachytherapy 07/29/2012.  Second vaginal recurrence (apex) diagnosed December 2016. Imaging without evidence of  metastatic disease.    Options discussed at multidisciplinary tumor conference and she was treated with conformational radiotherapy   03/10/2015 - 04/20/2015 Continue Femara.  CT C/A/P  07/2015 without metastatic disease Pap 10/2015 with Dr. Leo Grosser F/U  01/2016 with Gyn Onc Vaginal lubrication with coconut oil   Bilateral femoral necrosis on imaging Counselled on safe ambulation   IHCC positive testing Declined genetic counseling Consider MSI testing of tumor for consideration of  PD1 checkpoint inhibitor if there is recurrence   HPI:  Emily Greer is a 65 y.o. year old G3P2 initially seen in consultation on 10/05/2011  grade 1  endometrial cancer  She then underwent a  total abdominal hysterectomy bilateral salpingo-oophorectomy bilateral pelvic lymph node dissection on  78/29/5621 without complications.  Her postoperative course was  uncomplicated.  Her final pathologic diagnosis is a Stage  1A Grade  2 endometrioid endometrial cancer with  negative lymphovascular space invasion,  2/20 (10%) of myometrial invasion and negative lymph nodes.  On 01/2012 visit she reported  post coital vaginal bleeding  that was self limiting. Vaginal biopsy - LARGELY DENUDED SQUAMOUS EPITHELIUM WITH ASSOCIATED SPONGIOSIS AND CHRONIC INFLAMMATION.- NO DYSPLASIA OR MALIGNANCY  On the visit 04/2012 she c/o vaginal bleeding.  Biopsies were c/w metastatic disease at the distal vagina  PET 06/05/2012 IMPRESSION:  1. There are no specific features identified to suggest   hypermetabolic metastasis from endometrial carcinoma.  2. Skeletal changes of multiple myeloma with focal area of increased uptake   Treated with vaginal brachytherapy June 4, June 11, June 19, June 25, June 30/2014 Site/dose: Vagina, 30.5 Gy in 5 fractions (6 Gy, 6 Gy, 6 Gy, 6 Gy, 6.5 Gy)  Pap 08/17/2013 ASCUS HPV neg  She was seen by Dr Leo Grosser on 01/21/15 at which time a friable lesion was noted at the vaginal cuff. This was biopsied and was consistent with endometrioid adenocarcinoma (recurrence).  CT C/A/P 02/15/2015 without evidence of metastatic disease.  Care complicated by successful transplant 2016 for Rx multiple myeloma. Received conformational radiotherapy completed 03/2015 and then started on femara  CT C/A/P 07/26/2015 - PET declined by insurance- 1. Stable examinations demonstrating no evidence of metastatic endometrial carcinoma. 2. Stable widespread osseous findings attributed to multiple myeloma. No evidence of pathologic fracture or epidural tumor. 3. Grossly stable small left thyroid nodule. 4. Chronic bilateral femoral head avascular necrosis.    Past Medical History  Diagnosis Date  . Hypertension 11/28/97  . H/O multiple myeloma 11/28/1997  . Staphylococcus aureus bacteremia 11/28/1997  . Staphylococcus epidermidis bacteremia 11/28/97  . Pregnancy induced hypertension   . Osteoporosis 12/18/05    Increased  risk   . Increased BMI 12/18/05  . Post-menopausal bleeding 12/19/06  . Vaginal atrophy 12/19/06  . Elevated hemoglobin A1c     Borderline  . Fibroid 07/26/10    asymptomatic  . Vaginal atrophy   . PONV (postoperative nausea and vomiting)     nausea in past, none recent  . History of radiation therapy 6/4, 6/11, 6/19, 6/25, 07/29/2012    vagina 30.5 gray in  5 fx, HDR brachytherapy  . Radiation 03/10/15-04/20/15    vagina 50.4 gray  . Lambda light chain myeloma ([place]) 11/11/2007  . Endometrial carcinoma ([place]) 05/28/12     Past Surgical History   Procedure Laterality Date  . Tubal ligation  1986  . Ectopic pregnancy surgery  1992  . Hysteroscopy w/d&c  09/27/2011    Procedure: DILATATION AND CURETTAGE /HYSTEROSCOPY;  Surgeon: Eldred Manges, MD;  Location: [facility];  Service: Gynecology;;  . Laparotomy  10/31/2011    Procedure: EXPLORATORY LAPAROTOMY;  Surgeon: [name], MD PHD;  Location: [facility];  Service: Gynecology;  Laterality: N/A;  . Abdominal hysterectomy  10/31/2011    Procedure: HYSTERECTOMY ABDOMINAL;  Surgeon: [name], MD PHD;  Location: [facility];  Service: Gynecology;  Laterality: N/A;  . Salpingoophorectomy  10/31/2011    Procedure: SALPINGO OOPHERECTOMY;  Surgeon: [name], MD PHD;  Location: [facility];  Service: Gynecology;  Laterality: Bilateral;   Lymph Nodes sampling   Social History: Retired from [employer].  Her husband is well, also retired. Just moved to a new home.  Vacation to [place] scheduled for 08/2015  Review of systems: Constitutional:  She has no fever or chills. No changes in weight.  Cardiovascular: No chest pain, palpitations or edema. Respiratory:  No shortness of breath, wheezing or cough Gastrointestinal: She has normal bowel movements without diarrhea or constipation. She denies any nausea or vomiting.   Genitourinary:  She denies pelvic pain, pelvic pressure or changes in her urinary function. + vaginal bleeding (light) post coital Musculoskeletal:  No changes in gait or joint pain Otherwise uninformative 10 point review of system   Physical Exam: Blood pressure 130/70, pulse 79, temperature 98 F (36.7 C), temperature source Oral, resp. rate 18, height '5\' 4"'$  (1.626 m), weight 184 lb (83.462 kg), SpO2 100 %. Wt Readings from Last 3 Encounters:  08/19/15 184 lb (83.462 kg)  07/07/15 186 lb (84.369 kg)  06/09/15 190 lb (86.183 kg)   General: Well dressed, well nourished in no apparent distress.   Lungs:  Clear to auscultation bilaterally.  No  wheezes. Cardiovascular:  Regular rate and rhythm.  No murmurs or rubs. Abdomen:  Soft, nontender, nondistended.  No palpable masses.  No hepatosplenomegaly.  No ascites. Normal bowel sounds.  No hernias.   Genitourinary: Normal EGBUS radiation changes at the vaginal apex.  Smooth to touch, friable with manipulation of the speculum, no cul de sac masses or nodularity Rectal:  Good tone no masses Back: No CVA tenderness  LN:  No cervical supraclavicular or inguinal adenopathy  Extremities: No cyanosis, clubbing or edema.  No calf tenderness or erythema Musculoskeletal: No pain, normal strength and range of motion.

## 2015-08-25 ENCOUNTER — Other Ambulatory Visit: Payer: BC Managed Care – PPO

## 2015-08-25 ENCOUNTER — Ambulatory Visit: Payer: BC Managed Care – PPO | Admitting: Family

## 2015-08-25 ENCOUNTER — Ambulatory Visit: Payer: BC Managed Care – PPO

## 2015-08-26 ENCOUNTER — Other Ambulatory Visit (HOSPITAL_BASED_OUTPATIENT_CLINIC_OR_DEPARTMENT_OTHER): Payer: BC Managed Care – PPO

## 2015-08-26 ENCOUNTER — Ambulatory Visit (HOSPITAL_BASED_OUTPATIENT_CLINIC_OR_DEPARTMENT_OTHER): Payer: BC Managed Care – PPO

## 2015-08-26 ENCOUNTER — Ambulatory Visit: Payer: Self-pay | Admitting: Radiation Oncology

## 2015-08-26 ENCOUNTER — Ambulatory Visit (HOSPITAL_BASED_OUTPATIENT_CLINIC_OR_DEPARTMENT_OTHER): Payer: BC Managed Care – PPO | Admitting: Family

## 2015-08-26 VITALS — BP 128/80 | HR 68 | Temp 97.4°F | Resp 18 | Wt 184.0 lb

## 2015-08-26 DIAGNOSIS — C9000 Multiple myeloma not having achieved remission: Secondary | ICD-10-CM

## 2015-08-26 DIAGNOSIS — C7982 Secondary malignant neoplasm of genital organs: Secondary | ICD-10-CM

## 2015-08-26 DIAGNOSIS — Z5112 Encounter for antineoplastic immunotherapy: Secondary | ICD-10-CM | POA: Diagnosis not present

## 2015-08-26 DIAGNOSIS — C9001 Multiple myeloma in remission: Secondary | ICD-10-CM

## 2015-08-26 DIAGNOSIS — C541 Malignant neoplasm of endometrium: Secondary | ICD-10-CM | POA: Diagnosis not present

## 2015-08-26 LAB — CMP (CANCER CENTER ONLY)
ALT(SGPT): 28 U/L (ref 10–47)
AST: 22 U/L (ref 11–38)
Albumin: 4.1 g/dL (ref 3.3–5.5)
Alkaline Phosphatase: 43 U/L (ref 26–84)
BUN, Bld: 12 mg/dL (ref 7–22)
CO2: 29 mEq/L (ref 18–33)
Calcium: 9.5 mg/dL (ref 8.0–10.3)
Chloride: 102 mEq/L (ref 98–108)
Creat: 1.2 mg/dl (ref 0.6–1.2)
Glucose, Bld: 81 mg/dL (ref 73–118)
Potassium: 3.5 mEq/L (ref 3.3–4.7)
Sodium: 137 mEq/L (ref 128–145)
Total Bilirubin: 0.7 mg/dl (ref 0.20–1.60)
Total Protein: 7.5 g/dL (ref 6.4–8.1)

## 2015-08-26 LAB — CBC WITH DIFFERENTIAL (CANCER CENTER ONLY)
BASO#: 0 10*3/uL (ref 0.0–0.2)
BASO%: 0.7 % (ref 0.0–2.0)
EOS%: 2.2 % (ref 0.0–7.0)
Eosinophils Absolute: 0.1 10*3/uL (ref 0.0–0.5)
HCT: 34.7 % — ABNORMAL LOW (ref 34.8–46.6)
HGB: 11.5 g/dL — ABNORMAL LOW (ref 11.6–15.9)
LYMPH#: 1.1 10*3/uL (ref 0.9–3.3)
LYMPH%: 24 % (ref 14.0–48.0)
MCH: 27.9 pg (ref 26.0–34.0)
MCHC: 33.1 g/dL (ref 32.0–36.0)
MCV: 84 fL (ref 81–101)
MONO#: 0.5 10*3/uL (ref 0.1–0.9)
MONO%: 11.2 % (ref 0.0–13.0)
NEUT#: 2.8 10*3/uL (ref 1.5–6.5)
NEUT%: 61.9 % (ref 39.6–80.0)
Platelets: 213 10*3/uL (ref 145–400)
RBC: 4.12 10*6/uL (ref 3.70–5.32)
RDW: 15.8 % — ABNORMAL HIGH (ref 11.1–15.7)
WBC: 4.6 10*3/uL (ref 3.9–10.0)

## 2015-08-26 MED ORDER — BORTEZOMIB CHEMO SQ INJECTION 3.5 MG (2.5MG/ML)
1.2000 mg/m2 | Freq: Once | INTRAMUSCULAR | Status: AC
  Administered 2015-08-26: 2.5 mg via SUBCUTANEOUS
  Filled 2015-08-26: qty 2.5

## 2015-08-26 MED ORDER — ONDANSETRON HCL 8 MG PO TABS
ORAL_TABLET | ORAL | Status: AC
  Filled 2015-08-26: qty 1

## 2015-08-26 MED ORDER — ONDANSETRON HCL 8 MG PO TABS
8.0000 mg | ORAL_TABLET | Freq: Once | ORAL | Status: AC
  Administered 2015-08-26: 8 mg via ORAL

## 2015-08-26 NOTE — Patient Instructions (Signed)
Bortezomib injection What is this medicine? BORTEZOMIB (bor TEZ oh mib) is a medicine that targets proteins in cancer cells and stops the cancer cells from growing. It is used to treat multiple myeloma and mantle-cell lymphoma. This medicine may be used for other purposes; ask your health care provider or pharmacist if you have questions. What should I tell my health care provider before I take this medicine? They need to know if you have any of these conditions: -diabetes -heart disease -irregular heartbeat -liver disease -on hemodialysis -low blood counts, like low white blood cells, platelets, or hemoglobin -peripheral neuropathy -taking medicine for blood pressure -an unusual or allergic reaction to bortezomib, mannitol, boron, other medicines, foods, dyes, or preservatives -pregnant or trying to get pregnant -breast-feeding How should I use this medicine? This medicine is for injection into a vein or for injection under the skin. It is given by a health care professional in a hospital or clinic setting. Talk to your pediatrician regarding the use of this medicine in children. Special care may be needed. Overdosage: If you think you have taken too much of this medicine contact a poison control center or emergency room at once. NOTE: This medicine is only for you. Do not share this medicine with others. What if I miss a dose? It is important not to miss your dose. Call your doctor or health care professional if you are unable to keep an appointment. What may interact with this medicine? This medicine may interact with the following medications: -ketoconazole -rifampin -ritonavir -St. John's Wort This list may not describe all possible interactions. Give your health care provider a list of all the medicines, herbs, non-prescription drugs, or dietary supplements you use. Also tell them if you smoke, drink alcohol, or use illegal drugs. Some items may interact with your medicine. What  should I watch for while using this medicine? Visit your doctor for checks on your progress. This drug may make you feel generally unwell. This is not uncommon, as chemotherapy can affect healthy cells as well as cancer cells. Report any side effects. Continue your course of treatment even though you feel ill unless your doctor tells you to stop. You may get drowsy or dizzy. Do not drive, use machinery, or do anything that needs mental alertness until you know how this medicine affects you. Do not stand or sit up quickly, especially if you are an older patient. This reduces the risk of dizzy or fainting spells. In some cases, you may be given additional medicines to help with side effects. Follow all directions for their use. Call your doctor or health care professional for advice if you get a fever, chills or sore throat, or other symptoms of a cold or flu. Do not treat yourself. This drug decreases your body's ability to fight infections. Try to avoid being around people who are sick. This medicine may increase your risk to bruise or bleed. Call your doctor or health care professional if you notice any unusual bleeding. You may need blood work done while you are taking this medicine. In some patients, this medicine may cause a serious brain infection that may cause death. If you have any problems seeing, thinking, speaking, walking, or standing, tell your doctor right away. If you cannot reach your doctor, urgently seek other source of medical care. Do not become pregnant while taking this medicine. Women should inform their doctor if they wish to become pregnant or think they might be pregnant. There is a potential for serious  side effects to an unborn child. Talk to your health care professional or pharmacist for more information. Do not breast-feed an infant while taking this medicine. Check with your doctor or health care professional if you get an attack of severe diarrhea, nausea and vomiting, or if  you sweat a lot. The loss of too much body fluid can make it dangerous for you to take this medicine. What side effects may I notice from receiving this medicine? Side effects that you should report to your doctor or health care professional as soon as possible: -allergic reactions like skin rash, itching or hives, swelling of the face, lips, or tongue -breathing problems -changes in hearing -changes in vision -fast, irregular heartbeat -feeling faint or lightheaded, falls -pain, tingling, numbness in the hands or feet -right upper belly pain -seizures -swelling of the ankles, feet, hands -unusual bleeding or bruising -unusually weak or tired -vomiting -yellowing of the eyes or skin Side effects that usually do not require medical attention (report to your doctor or health care professional if they continue or are bothersome): -changes in emotions or moods -constipation -diarrhea -loss of appetite -headache -irritation at site where injected -nausea This list may not describe all possible side effects. Call your doctor for medical advice about side effects. You may report side effects to FDA at 1-800-FDA-1088. Where should I keep my medicine? This drug is given in a hospital or clinic and will not be stored at home. NOTE: This sheet is a summary. It may not cover all possible information. If you have questions about this medicine, talk to your doctor, pharmacist, or health care provider.    2016, Elsevier/Gold Standard. (2014-03-17 14:47:04)

## 2015-08-26 NOTE — Progress Notes (Signed)
Hematology and Oncology Follow Up Visit  Emily Greer [id number] 23-Oct-1950 65 y.o. 08/26/2015   Principle Diagnosis:  Recurrent lambda light chain myeloma Recurrent endometrial carcinoma at the vaginal apex  Current Therapy:   Status post second autologous stem cell transplant on 07/24/2014 Maintenance therapy with Pomalidomide/every 2 week Velcade Aredia 60 mg IV Q. 3 month - dose on 05/12/2015 Radiation therapy for endometrial recurrence - completed 04/20/2015 Femara 2.5mg  po q day    Interim History:  Emily Greer is here today. It has been very busy week for her. She and her husband just moved. They sold their house in one day. This was very surprising to her.  She now is getting ready to go to [place] area and she husband are going over for the [place].   She had a recent CT scan regarding her uterine cancer.    everything looked okay without any evidence of recurrent disease.   She saw her transplant doctor at [facility] yesterday. Everything looked real good. They are to see her back for a year. They apparently will do a bone marrow test out there next year. She was 89 we might fail to do it.   As far as her myeloma studies go, her lambda light chain was 1.53 mg/dL. I have not done a 24-hour urine on her. I may want to get this in a month or so.   She has had no issues with nausea or vomiting. She has had no leg swelling. She is on potassium pills.   There is no problems with pain. She's had no cough or shortness of breath.   Overall, her performance status is ECOG 0. Medications:    Medication List       Accurate as of 08/26/15  2:44 PM. Always use your most recent med list.          acyclovir 400 MG tablet Commonly known as:  ZOVIRAX Take 1 tablet (400 mg total) by mouth 2 (two) times daily.   albuterol 108 (90 Base) MCG/ACT inhaler Commonly known as:  PROVENTIL HFA;VENTOLIN HFA Inhale 2 puffs into the lungs every 6 (six) hours as  needed for wheezing or shortness of breath. 2 puffs 3 times daily x 5 days then every 6 hours as needed.   amLODipine 10 MG tablet Commonly known as:  NORVASC TAKE 1 TABLET BY MOUTH DAILY   bortezomib IV 3.5 MG injection Commonly known as:  VELCADE Inject 1.3 mg/m2 into the vein once. Patient is taking 2.5 MG injection every other week   letrozole 2.5 MG tablet Commonly known as:  FEMARA Take 1 tablet (2.5 mg total) by mouth daily.   loperamide 2 MG capsule Commonly known as:  IMODIUM Take by mouth as needed for diarrhea or loose stools. Reported on 08/19/2015   loratadine 10 MG tablet Commonly known as:  CLARITIN Take 10 mg by mouth every morning. Reported on 08/19/2015   loratadine-pseudoephedrine 10-240 MG 24 hr tablet Commonly known as:  CLARITIN-D 24-hour Take 1 tablet by mouth as needed. Reported on 08/19/2015   ondansetron 8 MG tablet Commonly known as:  ZOFRAN Take 8 mg by mouth every 8 (eight) hours as needed for nausea or vomiting. Reported on 08/19/2015   oxymetazoline 0.05 % nasal spray Commonly known as:  AFRIN Place 1 spray into both nostrils 2 (two) times daily as needed. Reported on 01/29/2015   pomalidomide 1 MG capsule Commonly known as:  POMALYST TAKE 1 CAPSULE (1MG ) BY  MOUTH ONCE DAILY FOR 21 DAYS ON AND 7 DAYS OFF Auth# [id number]   potassium chloride SA 20 MEQ tablet Commonly known as:  K-DUR,KLOR-CON Take 1 tablet (20 mEq total) by mouth 2 (two) times daily.   triamterene-hydrochlorothiazide 37.5-25 MG tablet Commonly known as:  MAXZIDE-25 Take 1 tablet by mouth daily.   Vitamin D3 2000 units Tabs Take 1 tablet by mouth daily.       Allergies:  Allergies  Allergen Reactions  . Codeine Nausea Only    Past Medical History, Surgical history, Social history, and Family History were reviewed and updated.  Review of Systems: All other 10 point review of systems is negative.   Physical Exam:  weight is 184 lb (83.5 kg). Her oral temperature is  97.4 F (36.3 C). Her blood pressure is 128/80 and her pulse is 68. Her respiration is 18.   Wt Readings from Last 3 Encounters:  08/26/15 184 lb (83.5 kg)  08/19/15 184 lb (83.5 kg)  07/07/15 186 lb (84.4 kg)    Ocular: Sclerae unicteric, pupils equal, round and reactive to light Ear-nose-throat: Oropharynx clear, dentition fair Lymphatic: No cervical supraclavicular or axillary adenopathy Lungs no rales or rhonchi, good excursion bilaterally Heart regular rate and rhythm, no murmur appreciated Abd soft, nontender, positive bowel sounds, no liver or spleen tip palpated on exam, no fluid wave MSK no focal spinal tenderness, no joint edema Neuro: non-focal, well-oriented, appropriate affect Breasts: Deferred  Lab Results  Component Value Date   WBC 4.6 08/26/2015   HGB 11.5 (L) 08/26/2015   HCT 34.7 (L) 08/26/2015   MCV 84 08/26/2015   PLT 213 08/26/2015   Lab Results  Component Value Date   FERRITIN 227 12/31/2014   IRON 13 (L) 12/31/2014   TIBC 302 12/31/2014   UIBC 289 12/31/2014   IRONPCTSAT 4 (L) 12/31/2014   Lab Results  Component Value Date   RETICCTPCT 0.6 12/31/2014   RBC 4.12 08/26/2015   Lab Results  Component Value Date   KPAFRELGTCHN 1.67 11/23/2014   LAMBDASER 1.85 11/23/2014   KAPLAMBRATIO 1.42 07/21/2015   Lab Results  Component Value Date   IGGSERUM 1,008 07/21/2015   IGA 91 12/31/2014   IGMSERUM 31 07/21/2015   Lab Results  Component Value Date   TOTALPROTELP 6.4 11/23/2014   ALBUMINELP 4.0 11/23/2014   A1GS 0.3 11/23/2014   A2GS 0.7 11/23/2014   BETS 0.5 11/23/2014   BETA2SER 0.4 11/23/2014   GAMS 0.7 (L) 11/23/2014   MSPIKE 0.2 (H) 07/21/2015   SPEI * 11/23/2014     Chemistry      Component Value Date/Time   NA 134 08/11/2015 1006   NA 141 03/17/2015 1018   K 3.5 08/11/2015 1006   K 3.2 (L) 03/17/2015 1018   CL 102 08/11/2015 1006   CO2 26 08/11/2015 1006   CO2 25 03/17/2015 1018   BUN 17 08/11/2015 1006   BUN 21.3  03/17/2015 1018   CREATININE 0.8 08/11/2015 1006   CREATININE 1.0 03/17/2015 1018      Component Value Date/Time   CALCIUM 9.3 08/11/2015 1006   CALCIUM 9.6 03/17/2015 1018   ALKPHOS 36 08/11/2015 1006   ALKPHOS 47 03/17/2015 1018   AST 20 08/11/2015 1006   AST 17 03/17/2015 1018   ALT 26 08/11/2015 1006   ALT 14 03/17/2015 1018   BILITOT 0.60 08/11/2015 1006   BILITOT 0.54 03/17/2015 1018      Impression and Plan: Emily Greer is 65 year old African-American female with  recurrent lambda light chain myeloma. She ultimately underwent a stem cell transplant about a year ago. This was in June 2016. Her F she recently started her vaccinations at [facility]. She goes back in 2 months for her next set.  I'm just great for thankful that her myeloma seems to be under very good control right now. She's doing well with the Pomalidomide and Velcade.  With her going to [place], we will give her Velcade today and then hold off until August 23 when she gets back  She will be due for her next Aredia in October. She had her last dose in July.  We will see her back after she gets home from [place].  Thankfully, the uterine cancer is not an issue right now. She's being followed by radiation oncology and gynecology.     [name] Kitchen Eliezer Bottom, NP 7/27/20172:44 PM

## 2015-08-27 LAB — KAPPA/LAMBDA LIGHT CHAINS
Ig Kappa Free Light Chain: 21.7 mg/L — ABNORMAL HIGH (ref 3.3–19.4)
Ig Lambda Free Light Chain: 15.1 mg/L (ref 5.7–26.3)
Kappa/Lambda FluidC Ratio: 1.44 (ref 0.26–1.65)

## 2015-08-30 LAB — MULTIPLE MYELOMA PANEL, SERUM
Albumin SerPl Elph-Mcnc: 3.8 g/dL (ref 2.9–4.4)
Albumin/Glob SerPl: 1.1 (ref 0.7–1.7)
Alpha 1: 0.2 g/dL (ref 0.0–0.4)
Alpha2 Glob SerPl Elph-Mcnc: 0.8 g/dL (ref 0.4–1.0)
B-Globulin SerPl Elph-Mcnc: 1.3 g/dL (ref 0.7–1.3)
Gamma Glob SerPl Elph-Mcnc: 1.2 g/dL (ref 0.4–1.8)
Globulin, Total: 3.5 g/dL (ref 2.2–3.9)
IgA, Qn, Serum: 169 mg/dL (ref 87–352)
IgG, Qn, Serum: 1118 mg/dL (ref 700–1600)
IgM, Qn, Serum: 35 mg/dL (ref 26–217)
M Protein SerPl Elph-Mcnc: 0.3 g/dL — ABNORMAL HIGH
Total Protein: 7.3 g/dL (ref 6.0–8.5)

## 2015-09-08 ENCOUNTER — Ambulatory Visit: Payer: BC Managed Care – PPO | Admitting: Hematology & Oncology

## 2015-09-08 ENCOUNTER — Encounter: Payer: Self-pay | Admitting: Nurse Practitioner

## 2015-09-08 ENCOUNTER — Other Ambulatory Visit: Payer: BC Managed Care – PPO

## 2015-09-08 ENCOUNTER — Ambulatory Visit: Payer: BC Managed Care – PPO

## 2015-09-16 ENCOUNTER — Other Ambulatory Visit: Payer: Self-pay | Admitting: Hematology & Oncology

## 2015-09-16 ENCOUNTER — Other Ambulatory Visit: Payer: Self-pay | Admitting: *Deleted

## 2015-09-16 MED ORDER — POMALIDOMIDE 1 MG PO CAPS: 1.0000 mg | ORAL_CAPSULE | Freq: Every day | ORAL | 0 refills | Status: DC

## 2015-09-22 ENCOUNTER — Ambulatory Visit (HOSPITAL_BASED_OUTPATIENT_CLINIC_OR_DEPARTMENT_OTHER): Payer: BC Managed Care – PPO

## 2015-09-22 ENCOUNTER — Ambulatory Visit (HOSPITAL_BASED_OUTPATIENT_CLINIC_OR_DEPARTMENT_OTHER): Payer: BC Managed Care – PPO | Admitting: Hematology & Oncology

## 2015-09-22 ENCOUNTER — Encounter: Payer: Self-pay | Admitting: Hematology & Oncology

## 2015-09-22 ENCOUNTER — Other Ambulatory Visit (HOSPITAL_BASED_OUTPATIENT_CLINIC_OR_DEPARTMENT_OTHER): Payer: BC Managed Care – PPO

## 2015-09-22 DIAGNOSIS — C9001 Multiple myeloma in remission: Secondary | ICD-10-CM

## 2015-09-22 DIAGNOSIS — Z5112 Encounter for antineoplastic immunotherapy: Secondary | ICD-10-CM | POA: Diagnosis not present

## 2015-09-22 DIAGNOSIS — C9000 Multiple myeloma not having achieved remission: Secondary | ICD-10-CM | POA: Diagnosis not present

## 2015-09-22 DIAGNOSIS — C541 Malignant neoplasm of endometrium: Secondary | ICD-10-CM

## 2015-09-22 DIAGNOSIS — C7982 Secondary malignant neoplasm of genital organs: Secondary | ICD-10-CM

## 2015-09-22 LAB — CBC WITH DIFFERENTIAL (CANCER CENTER ONLY)
BASO#: 0 10*3/uL (ref 0.0–0.2)
BASO%: 0.8 % (ref 0.0–2.0)
EOS%: 4.4 % (ref 0.0–7.0)
Eosinophils Absolute: 0.2 10*3/uL (ref 0.0–0.5)
HCT: 33.7 % — ABNORMAL LOW (ref 34.8–46.6)
HGB: 11.2 g/dL — ABNORMAL LOW (ref 11.6–15.9)
LYMPH#: 1.2 10*3/uL (ref 0.9–3.3)
LYMPH%: 32.1 % (ref 14.0–48.0)
MCH: 27.5 pg (ref 26.0–34.0)
MCHC: 33.2 g/dL (ref 32.0–36.0)
MCV: 83 fL (ref 81–101)
MONO#: 0.5 10*3/uL (ref 0.1–0.9)
MONO%: 13.7 % — ABNORMAL HIGH (ref 0.0–13.0)
NEUT#: 1.8 10*3/uL (ref 1.5–6.5)
NEUT%: 49 % (ref 39.6–80.0)
Platelets: 193 10*3/uL (ref 145–400)
RBC: 4.08 10*6/uL (ref 3.70–5.32)
RDW: 15.4 % (ref 11.1–15.7)
WBC: 3.7 10*3/uL — ABNORMAL LOW (ref 3.9–10.0)

## 2015-09-22 LAB — CMP (CANCER CENTER ONLY)
ALT(SGPT): 21 U/L (ref 10–47)
AST: 21 U/L (ref 11–38)
Albumin: 3.5 g/dL (ref 3.3–5.5)
Alkaline Phosphatase: 46 U/L (ref 26–84)
BUN, Bld: 13 mg/dL (ref 7–22)
CO2: 22 mEq/L (ref 18–33)
Calcium: 9.3 mg/dL (ref 8.0–10.3)
Chloride: 105 mEq/L (ref 98–108)
Creat: 1.1 mg/dl (ref 0.6–1.2)
Glucose, Bld: 91 mg/dL (ref 73–118)
Potassium: 3.4 mEq/L (ref 3.3–4.7)
Sodium: 135 mEq/L (ref 128–145)
Total Bilirubin: 0.6 mg/dl (ref 0.20–1.60)
Total Protein: 7.4 g/dL (ref 6.4–8.1)

## 2015-09-22 MED ORDER — ONDANSETRON HCL 8 MG PO TABS
8.0000 mg | ORAL_TABLET | Freq: Once | ORAL | Status: AC
  Administered 2015-09-22: 8 mg via ORAL

## 2015-09-22 MED ORDER — BORTEZOMIB CHEMO SQ INJECTION 3.5 MG (2.5MG/ML)
1.2000 mg/m2 | Freq: Once | INTRAMUSCULAR | Status: AC
  Administered 2015-09-22: 2.5 mg via SUBCUTANEOUS
  Filled 2015-09-22: qty 2.5

## 2015-09-22 MED ORDER — ONDANSETRON HCL 8 MG PO TABS
ORAL_TABLET | ORAL | Status: AC
  Filled 2015-09-22: qty 1

## 2015-09-22 MED ORDER — AMOXICILLIN-POT CLAVULANATE 875-125 MG PO TABS: 1.0000 | ORAL_TABLET | Freq: Two times a day (BID) | ORAL | 0 refills | Status: DC

## 2015-09-22 NOTE — Patient Instructions (Signed)
Cancer Center Discharge Instructions for Patients Receiving Chemotherapy  Today you received the following chemotherapy agents Velcade  To help prevent nausea and vomiting after your treatment, we encourage you to take your nausea medication    If you develop nausea and vomiting that is not controlled by your nausea medication, call the clinic.   BELOW ARE SYMPTOMS THAT SHOULD BE REPORTED IMMEDIATELY:  *FEVER GREATER THAN 100.5 F  *CHILLS WITH OR WITHOUT FEVER  NAUSEA AND VOMITING THAT IS NOT CONTROLLED WITH YOUR NAUSEA MEDICATION  *UNUSUAL SHORTNESS OF BREATH  *UNUSUAL BRUISING OR BLEEDING  TENDERNESS IN MOUTH AND THROAT WITH OR WITHOUT PRESENCE OF ULCERS  *URINARY PROBLEMS  *BOWEL PROBLEMS  UNUSUAL RASH Items with * indicate a potential emergency and should be followed up as soon as possible.  Feel free to call the clinic you have any questions or concerns. The clinic phone number is [phone].  Please show the CHEMO ALERT CARD at check-in to the Emergency Department and triage nurse.   

## 2015-09-22 NOTE — Progress Notes (Signed)
Hematology and Oncology Follow Up Visit  Emily Greer [id number] 19-Jun-1950 65 y.o. 09/22/2015   Principle Diagnosis:   Recurrent lambda light chain myeloma  History of recurrent endometrial carcinoma  Current Therapy:    Status post second autologous stem cell transplant on 07/24/2014  Maintenance therapy with Pomalidomide/every 2 week Velcade  Aredia 60 mg IV Q. 3 month - next dose on 10/2015  Radiation therapy for endometrial recurrence - completed 04/20/2015  Femara 2.5mg  po q day     Interim History:  Emily Greer is back for followup. She and her husband had a fantastic time over in [place]. They basically went to [place]. They really enjoyed themselves. Patient did come down to a sinus infection. She has a lot of sinus drainage. There is a little bit of sinus tenderness to palpation. I think we probably have to get her on some antibiotics given that she is on treatment for the myeloma.   She is not complaining, bony pain. She's having no problems with respect to her past therapy for the uterine cancer. She's had no bleeding.   Her appetite is good. There is no nausea or vomiting.   Her myeloma studies have looked fantastic. Back in July, her lambda light chain was 1.5 mg/dL. Her M spike was 0.3 g/dL.  She and her husband are still getting moving into their new house. There is a lot of packing that they have to take care of. They're very thankful however that they were able to sell their house in one day and is able to move so quickly. Overall, her performance status is ECOG 1.  Medications: Allergies:  Allergies  Allergen Reactions  . Codeine Nausea Only    Past Medical History, Surgical history, Social history, and Family History were reviewed and updated.  Review of Systems: As above  Physical Exam:  height is 5\' 4"  (1.626 m) and weight is 183 lb (83 kg). Her oral temperature is 97.8 F (36.6 C). Her blood pressure is 142/73 (abnormal) and her  pulse is 70. Her respiration is 16.   Well-developed and well-nourished [name]. Head and neck exam shows no ocular or oral lesion. There are no palpable cervical or supraclavicular lymph nodes. Lungs are clear. Cardiac exam regular rate and rhythm with no murmurs rubs or bruits. Abdomen is soft. She has good bowel sounds. There is no fluid wave. There is no palpable liver or spleen tip. Extremities shows no clubbing, cyanosis or edema. Neurological exam shows no focal neurological deficits. Skin exam no rashes, ecchymosis or petechia.  Lab Results  Component Value Date   WBC 3.7 (L) 09/22/2015   HGB 11.2 (L) 09/22/2015   HCT 33.7 (L) 09/22/2015   MCV 83 09/22/2015   PLT 193 09/22/2015     Chemistry      Component Value Date/Time   NA 135 09/22/2015 1007   NA 141 03/17/2015 1018   K 3.4 09/22/2015 1007   K 3.2 (L) 03/17/2015 1018   CL 105 09/22/2015 1007   CO2 22 09/22/2015 1007   CO2 25 03/17/2015 1018   BUN 13 09/22/2015 1007   BUN 21.3 03/17/2015 1018   CREATININE 1.1 09/22/2015 1007   CREATININE 1.0 03/17/2015 1018      Component Value Date/Time   CALCIUM 9.3 09/22/2015 1007   CALCIUM 9.6 03/17/2015 1018   ALKPHOS 46 09/22/2015 1007   ALKPHOS 47 03/17/2015 1018   AST 21 09/22/2015 1007   AST 17 03/17/2015 1018  ALT 21 09/22/2015 1007   ALT 14 03/17/2015 1018   BILITOT 0.60 09/22/2015 1007   BILITOT 0.54 03/17/2015 1018         Impression and Plan: Emily Greer is 65 year old female. She has recurrent lambda light chain myeloma. She also had recurrent endometrial cancer. The recurrence was localized. She is being followed by OB/GYN and radiation oncology for this. She completed radiation therapy back in March. She had 50.4 rad for this.   We will go ahead with Velcade today. We do this every 2 weeks.  I am a little bit worried about this sinus drainage. I do think that we have to get on some antibiotics to try to help with this. I will see liken her  on Augmentin. I did this would be reasonable.  I'm so happy that she had a good time over in [place]. She and her husband went over there. They saw the [employer].  I'll plan to get her back to see [place] in another 2 weeks.  [name], MD 8/23/20175:23 PM

## 2015-09-23 LAB — KAPPA/LAMBDA LIGHT CHAINS
Ig Kappa Free Light Chain: 23.7 mg/L — ABNORMAL HIGH (ref 3.3–19.4)
Ig Lambda Free Light Chain: 16.8 mg/L (ref 5.7–26.3)
Kappa/Lambda FluidC Ratio: 1.41 (ref 0.26–1.65)

## 2015-09-27 LAB — MULTIPLE MYELOMA PANEL, SERUM
Albumin SerPl Elph-Mcnc: 4.1 g/dL (ref 2.9–4.4)
Albumin/Glob SerPl: 1.4 (ref 0.7–1.7)
Alpha 1: 0.2 g/dL (ref 0.0–0.4)
Alpha2 Glob SerPl Elph-Mcnc: 0.7 g/dL (ref 0.4–1.0)
B-Globulin SerPl Elph-Mcnc: 1.1 g/dL (ref 0.7–1.3)
Gamma Glob SerPl Elph-Mcnc: 0.9 g/dL (ref 0.4–1.8)
Globulin, Total: 3 g/dL (ref 2.2–3.9)
IgA, Qn, Serum: 174 mg/dL (ref 87–352)
IgG, Qn, Serum: 1216 mg/dL (ref 700–1600)
IgM, Qn, Serum: 36 mg/dL (ref 26–217)
M Protein SerPl Elph-Mcnc: 0.1 g/dL — ABNORMAL HIGH
Total Protein: 7.1 g/dL (ref 6.0–8.5)

## 2015-10-06 ENCOUNTER — Ambulatory Visit (HOSPITAL_BASED_OUTPATIENT_CLINIC_OR_DEPARTMENT_OTHER): Payer: BC Managed Care – PPO | Admitting: Family

## 2015-10-06 ENCOUNTER — Ambulatory Visit: Payer: BC Managed Care – PPO | Admitting: Family

## 2015-10-06 ENCOUNTER — Other Ambulatory Visit (HOSPITAL_BASED_OUTPATIENT_CLINIC_OR_DEPARTMENT_OTHER): Payer: BC Managed Care – PPO

## 2015-10-06 ENCOUNTER — Ambulatory Visit (HOSPITAL_BASED_OUTPATIENT_CLINIC_OR_DEPARTMENT_OTHER): Payer: BC Managed Care – PPO

## 2015-10-06 VITALS — BP 119/65 | HR 61 | Temp 98.2°F | Resp 20

## 2015-10-06 DIAGNOSIS — Z5112 Encounter for antineoplastic immunotherapy: Secondary | ICD-10-CM

## 2015-10-06 DIAGNOSIS — C7982 Secondary malignant neoplasm of genital organs: Principal | ICD-10-CM

## 2015-10-06 DIAGNOSIS — C9000 Multiple myeloma not having achieved remission: Secondary | ICD-10-CM

## 2015-10-06 DIAGNOSIS — C541 Malignant neoplasm of endometrium: Secondary | ICD-10-CM | POA: Diagnosis not present

## 2015-10-06 LAB — CBC WITH DIFFERENTIAL (CANCER CENTER ONLY)
BASO#: 0.1 10*3/uL (ref 0.0–0.2)
BASO%: 2.1 % — ABNORMAL HIGH (ref 0.0–2.0)
EOS%: 3 % (ref 0.0–7.0)
Eosinophils Absolute: 0.1 10*3/uL (ref 0.0–0.5)
HCT: 32 % — ABNORMAL LOW (ref 34.8–46.6)
HGB: 10.7 g/dL — ABNORMAL LOW (ref 11.6–15.9)
LYMPH#: 1.2 10*3/uL (ref 0.9–3.3)
LYMPH%: 37.1 % (ref 14.0–48.0)
MCH: 27.6 pg (ref 26.0–34.0)
MCHC: 33.4 g/dL (ref 32.0–36.0)
MCV: 83 fL (ref 81–101)
MONO#: 0.5 10*3/uL (ref 0.1–0.9)
MONO%: 15.2 % — ABNORMAL HIGH (ref 0.0–13.0)
NEUT#: 1.4 10*3/uL — ABNORMAL LOW (ref 1.5–6.5)
NEUT%: 42.6 % (ref 39.6–80.0)
Platelets: 225 10*3/uL (ref 145–400)
RBC: 3.87 10*6/uL (ref 3.70–5.32)
RDW: 16.2 % — ABNORMAL HIGH (ref 11.1–15.7)
WBC: 3.3 10*3/uL — ABNORMAL LOW (ref 3.9–10.0)

## 2015-10-06 LAB — CMP (CANCER CENTER ONLY)
ALT(SGPT): 28 U/L (ref 10–47)
AST: 26 U/L (ref 11–38)
Albumin: 3.3 g/dL (ref 3.3–5.5)
Alkaline Phosphatase: 50 U/L (ref 26–84)
BUN, Bld: 15 mg/dL (ref 7–22)
CO2: 30 mEq/L (ref 18–33)
Calcium: 8.8 mg/dL (ref 8.0–10.3)
Chloride: 104 mEq/L (ref 98–108)
Creat: 1.2 mg/dl (ref 0.6–1.2)
Glucose, Bld: 91 mg/dL (ref 73–118)
Potassium: 3.4 mEq/L (ref 3.3–4.7)
Sodium: 136 mEq/L (ref 128–145)
Total Bilirubin: 0.5 mg/dl (ref 0.20–1.60)
Total Protein: 7.2 g/dL (ref 6.4–8.1)

## 2015-10-06 MED ORDER — ONDANSETRON HCL 8 MG PO TABS
8.0000 mg | ORAL_TABLET | Freq: Once | ORAL | Status: AC
  Administered 2015-10-06: 8 mg via ORAL

## 2015-10-06 MED ORDER — ONDANSETRON HCL 8 MG PO TABS
ORAL_TABLET | ORAL | Status: AC
  Filled 2015-10-06: qty 1

## 2015-10-06 MED ORDER — BORTEZOMIB CHEMO SQ INJECTION 3.5 MG (2.5MG/ML)
1.2000 mg/m2 | Freq: Once | INTRAMUSCULAR | Status: AC
  Administered 2015-10-06: 2.5 mg via SUBCUTANEOUS
  Filled 2015-10-06: qty 2.5

## 2015-10-06 NOTE — Progress Notes (Signed)
Hematology and Oncology Follow Up Visit  Emily Greer [id number] [birth year] 65 y.o. 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 3 month - due again 10/2015 Radiation therapy for endometrial recurrence - completed 04/20/2015 Femara 2.5mg  po q day    Interim History:  Emily Greer is here today for follow-up and Velcade. She is doing quite well and has no complaints at this time. She and her husband had a great time in [place]. She really liked [place] and hopes to go back again someday.  Her myeloma studies have been stable. M spike in August was 0.1 g/dL and lambda light chains 16.8 mg/L. No fever, chills, nv/, cough, rash, dizziness, SOB, chest pain, palpitations, abdominal pain or changes in bowel or bladder habits.  No swelling, tenderness, numbness or tingling in her extremities. No new aches or pains. She has arthritic pain in her knees.  She has maintained a good appetite and is staying well hydrated.   Medications:    Medication List       Accurate as of 10/06/15  2:58 PM. Always use your most recent med list.          acyclovir 400 MG tablet Commonly known as:  ZOVIRAX Take 1 tablet (400 mg total) by mouth 2 (two) times daily.   albuterol 108 (90 Base) MCG/ACT inhaler Commonly known as:  PROVENTIL HFA;VENTOLIN HFA Inhale 2 puffs into the lungs every 6 (six) hours as needed for wheezing or shortness of breath. 2 puffs 3 times daily x 5 days then every 6 hours as needed.   amLODipine 10 MG tablet Commonly known as:  NORVASC TAKE 1 TABLET BY MOUTH DAILY   amoxicillin 500 MG capsule Commonly known as:  AMOXIL 2,000 mg. Prior to dental procedure.   bortezomib IV 3.5 MG injection Commonly known as:  VELCADE Inject  1.3 mg/m2 into the vein once. Patient is taking 2.5 MG injection every other week   letrozole 2.5 MG tablet Commonly known as:  FEMARA Take 1 tablet (2.5 mg total) by mouth daily.   loperamide 2 MG capsule Commonly known as:  IMODIUM Take by mouth as needed for diarrhea or loose stools. Reported on 08/19/2015   loratadine 10 MG tablet Commonly known as:  CLARITIN Take 10 mg by mouth every morning. Reported on 08/19/2015   loratadine-pseudoephedrine 10-240 MG 24 hr tablet Commonly known as:  CLARITIN-D 24-hour Take 1 tablet by mouth as needed. Reported on 08/19/2015   ondansetron 8 MG tablet Commonly known as:  ZOFRAN Take 8 mg by mouth every 8 (eight) hours as needed for nausea or vomiting. Reported on 08/19/2015   oxymetazoline 0.05 % nasal spray Commonly known as:  AFRIN Place 1 spray into both nostrils 2 (two) times daily as needed. Reported on 01/29/2015   pomalidomide 1 MG capsule Commonly known as:  POMALYST Take 1 capsule (1 mg total) by mouth daily. Take with water on days 1-21. Repeat every 28 days. Auth # [id number]   potassium chloride SA 20 MEQ tablet Commonly known as:  K-DUR,KLOR-CON Take 1 tablet (20 mEq total) by mouth 2 (two) times daily.   triamterene-hydrochlorothiazide 37.5-25 MG tablet Commonly known as:  MAXZIDE-25 Take 1 tablet by mouth daily.   Vitamin D3 2000 units Tabs Take 1 tablet by  mouth daily.       Allergies:  Allergies  Allergen Reactions  . Codeine Nausea Only    Past Medical History, Surgical history, Social history, and Family History were reviewed and updated.  Review of Systems: All other 10 point review of systems is negative.   Physical Exam:  vitals were not taken for this visit.  Wt Readings from Last 3 Encounters:  09/22/15 183 lb (83 kg)  08/26/15 184 lb (83.5 kg)  08/19/15 184 lb (83.5 kg)    Ocular: Sclerae unicteric, pupils equal, round and reactive to light Ear-nose-throat: Oropharynx clear, dentition  fair Lymphatic: No cervical supraclavicular or axillary adenopathy Lungs no rales or rhonchi, good excursion bilaterally Heart regular rate and rhythm, no murmur appreciated Abd soft, nontender, positive bowel sounds, no liver or spleen tip palpated on exam, no fluid wave MSK no focal spinal tenderness, no joint edema Neuro: non-focal, well-oriented, appropriate affect Breasts: Deferred  Lab Results  Component Value Date   WBC 3.3 (L) 10/06/2015   HGB 10.7 (L) 10/06/2015   HCT 32.0 (L) 10/06/2015   MCV 83 10/06/2015   PLT 225 10/06/2015   Lab Results  Component Value Date   FERRITIN 227 12/31/2014   IRON 13 (L) 12/31/2014   TIBC 302 12/31/2014   UIBC 289 12/31/2014   IRONPCTSAT 4 (L) 12/31/2014   Lab Results  Component Value Date   RETICCTPCT 0.6 12/31/2014   RBC 3.87 10/06/2015   Lab Results  Component Value Date   KPAFRELGTCHN 1.67 11/23/2014   LAMBDASER 1.85 11/23/2014   KAPLAMBRATIO 1.41 09/22/2015   Lab Results  Component Value Date   IGGSERUM 1,216 09/22/2015   IGA 91 12/31/2014   IGMSERUM 36 09/22/2015   Lab Results  Component Value Date   TOTALPROTELP 6.4 11/23/2014   ALBUMINELP 4.0 11/23/2014   A1GS 0.3 11/23/2014   A2GS 0.7 11/23/2014   BETS 0.5 11/23/2014   BETA2SER 0.4 11/23/2014   GAMS 0.7 (L) 11/23/2014   MSPIKE 0.2 (H) 07/21/2015   SPEI * 11/23/2014     Chemistry      Component Value Date/Time   NA 136 10/06/2015 1111   NA 141 03/17/2015 1018   K 3.4 10/06/2015 1111   K 3.2 (L) 03/17/2015 1018   CL 104 10/06/2015 1111   CO2 30 10/06/2015 1111   CO2 25 03/17/2015 1018   BUN 15 10/06/2015 1111   BUN 21.3 03/17/2015 1018   CREATININE 1.2 10/06/2015 1111   CREATININE 1.0 03/17/2015 1018      Component Value Date/Time   CALCIUM 8.8 10/06/2015 1111   CALCIUM 9.6 03/17/2015 1018   ALKPHOS 50 10/06/2015 1111   ALKPHOS 47 03/17/2015 1018   AST 26 10/06/2015 1111   AST 17 03/17/2015 1018   ALT 28 10/06/2015 1111   ALT 14 03/17/2015  1018   BILITOT 0.50 10/06/2015 1111   BILITOT 0.54 03/17/2015 1018      Impression and Plan: Emily Greer is 65 yo African American female with recurrent lambda light chain myeloma. She ultimately underwent a stem cell transplant in June 2016. So far, her myeloma studies have been stable. She now has localized recurrent endometrial cancer and is being followed by OB/GYN and radiation oncology for this. She completed radiation therapy in March. She continues to do well on Femara and has no complaints at this time.  She will receive Velcade today as planned and continue on daily Pomalyst.  She has her current treatment and appointment schedule. We will  see her back in 2 weeks for follow-up and infusion.  She will contact our office with any questions or concerns. We can certainly see her sooner if need be.   Eliezer Bottom, NP 9/6/20172:58 PM

## 2015-10-06 NOTE — Patient Instructions (Signed)
[facility] Discharge Instructions for Patients Receiving Chemotherapy  Today you received the following chemotherapy agents Velcade  To help prevent nausea and vomiting after your treatment, we encourage you to take your nausea medication    If you develop nausea and vomiting that is not controlled by your nausea medication, call the clinic.   BELOW ARE SYMPTOMS THAT SHOULD BE REPORTED IMMEDIATELY:  *FEVER GREATER THAN 100.5 F  *CHILLS WITH OR WITHOUT FEVER  NAUSEA AND VOMITING THAT IS NOT CONTROLLED WITH YOUR NAUSEA MEDICATION  *UNUSUAL SHORTNESS OF BREATH  *UNUSUAL BRUISING OR BLEEDING  TENDERNESS IN MOUTH AND THROAT WITH OR WITHOUT PRESENCE OF ULCERS  *URINARY PROBLEMS  *BOWEL PROBLEMS  UNUSUAL RASH Items with * indicate a potential emergency and should be followed up as soon as possible.  Feel free to call the clinic you have any questions or concerns. The clinic phone number is [phone].  Please show the CHEMO ALERT CARD at check-in to the Emergency Department and triage nurse.   

## 2015-10-07 LAB — KAPPA/LAMBDA LIGHT CHAINS
Ig Kappa Free Light Chain: 20.5 mg/L — ABNORMAL HIGH (ref 3.3–19.4)
Ig Lambda Free Light Chain: 16.7 mg/L (ref 5.7–26.3)
Kappa/Lambda FluidC Ratio: 1.23 (ref 0.26–1.65)

## 2015-10-13 LAB — MULTIPLE MYELOMA PANEL, SERUM
Albumin SerPl Elph-Mcnc: 3.7 g/dL (ref 2.9–4.4)
Albumin/Glob SerPl: 1.3 (ref 0.7–1.7)
Alpha 1: 0.1 g/dL (ref 0.0–0.4)
Alpha2 Glob SerPl Elph-Mcnc: 0.6 g/dL (ref 0.4–1.0)
B-Globulin SerPl Elph-Mcnc: 1 g/dL (ref 0.7–1.3)
Gamma Glob SerPl Elph-Mcnc: 1 g/dL (ref 0.4–1.8)
Globulin, Total: 2.9 g/dL (ref 2.2–3.9)
IgA, Qn, Serum: 167 mg/dL (ref 87–352)
IgG, Qn, Serum: 1257 mg/dL (ref 700–1600)
IgM, Qn, Serum: 35 mg/dL (ref 26–217)
M Protein SerPl Elph-Mcnc: 0.2 g/dL — ABNORMAL HIGH
Total Protein: 6.6 g/dL (ref 6.0–8.5)

## 2015-10-18 ENCOUNTER — Other Ambulatory Visit: Payer: Self-pay | Admitting: Nurse Practitioner

## 2015-10-18 ENCOUNTER — Other Ambulatory Visit: Payer: Self-pay | Admitting: Hematology & Oncology

## 2015-10-18 MED ORDER — POMALIDOMIDE 1 MG PO CAPS: ORAL_CAPSULE | ORAL | 0 refills | Status: DC

## 2015-10-20 ENCOUNTER — Ambulatory Visit: Payer: BC Managed Care – PPO | Admitting: Hematology & Oncology

## 2015-10-20 ENCOUNTER — Ambulatory Visit (HOSPITAL_BASED_OUTPATIENT_CLINIC_OR_DEPARTMENT_OTHER): Payer: BC Managed Care – PPO

## 2015-10-20 ENCOUNTER — Other Ambulatory Visit (HOSPITAL_BASED_OUTPATIENT_CLINIC_OR_DEPARTMENT_OTHER): Payer: BC Managed Care – PPO

## 2015-10-20 VITALS — BP 120/74 | HR 75 | Temp 97.9°F | Resp 20

## 2015-10-20 DIAGNOSIS — C9000 Multiple myeloma not having achieved remission: Secondary | ICD-10-CM

## 2015-10-20 DIAGNOSIS — C541 Malignant neoplasm of endometrium: Secondary | ICD-10-CM

## 2015-10-20 DIAGNOSIS — C7982 Secondary malignant neoplasm of genital organs: Secondary | ICD-10-CM

## 2015-10-20 DIAGNOSIS — Z5112 Encounter for antineoplastic immunotherapy: Secondary | ICD-10-CM

## 2015-10-20 LAB — CBC WITH DIFFERENTIAL (CANCER CENTER ONLY)
BASO#: 0.1 10*3/uL (ref 0.0–0.2)
BASO%: 1.3 % (ref 0.0–2.0)
EOS%: 2.9 % (ref 0.0–7.0)
Eosinophils Absolute: 0.1 10*3/uL (ref 0.0–0.5)
HCT: 36.5 % (ref 34.8–46.6)
HGB: 12.2 g/dL (ref 11.6–15.9)
LYMPH#: 1.6 10*3/uL (ref 0.9–3.3)
LYMPH%: 35 % (ref 14.0–48.0)
MCH: 27.2 pg (ref 26.0–34.0)
MCHC: 33.4 g/dL (ref 32.0–36.0)
MCV: 81 fL (ref 81–101)
MONO#: 0.7 10*3/uL (ref 0.1–0.9)
MONO%: 15.5 % — ABNORMAL HIGH (ref 0.0–13.0)
NEUT#: 2 10*3/uL (ref 1.5–6.5)
NEUT%: 45.3 % (ref 39.6–80.0)
Platelets: 192 10*3/uL (ref 145–400)
RBC: 4.49 10*6/uL (ref 3.70–5.32)
RDW: 16.1 % — ABNORMAL HIGH (ref 11.1–15.7)
WBC: 4.5 10*3/uL (ref 3.9–10.0)

## 2015-10-20 LAB — CMP (CANCER CENTER ONLY)
ALT(SGPT): 25 U/L (ref 10–47)
AST: 25 U/L (ref 11–38)
Albumin: 3.9 g/dL (ref 3.3–5.5)
Alkaline Phosphatase: 51 U/L (ref 26–84)
BUN, Bld: 20 mg/dL (ref 7–22)
CO2: 29 mEq/L (ref 18–33)
Calcium: 9.5 mg/dL (ref 8.0–10.3)
Chloride: 100 mEq/L (ref 98–108)
Creat: 1.2 mg/dl (ref 0.6–1.2)
Glucose, Bld: 88 mg/dL (ref 73–118)
Potassium: 3.5 mEq/L (ref 3.3–4.7)
Sodium: 136 mEq/L (ref 128–145)
Total Bilirubin: 0.7 mg/dl (ref 0.20–1.60)
Total Protein: 8 g/dL (ref 6.4–8.1)

## 2015-10-20 MED ORDER — ONDANSETRON HCL 8 MG PO TABS
8.0000 mg | ORAL_TABLET | Freq: Once | ORAL | Status: AC
  Administered 2015-10-20: 8 mg via ORAL

## 2015-10-20 MED ORDER — ONDANSETRON HCL 8 MG PO TABS
ORAL_TABLET | ORAL | Status: AC
  Filled 2015-10-20: qty 1

## 2015-10-20 MED ORDER — BORTEZOMIB CHEMO SQ INJECTION 3.5 MG (2.5MG/ML)
1.2000 mg/m2 | Freq: Once | INTRAMUSCULAR | Status: AC
  Administered 2015-10-20: 2.5 mg via SUBCUTANEOUS
  Filled 2015-10-20: qty 2.5

## 2015-10-20 NOTE — Patient Instructions (Signed)
[facility] Discharge Instructions for Patients Receiving Chemotherapy  Today you received the following chemotherapy agents Velcade  To help prevent nausea and vomiting after your treatment, we encourage you to take your nausea medication    If you develop nausea and vomiting that is not controlled by your nausea medication, call the clinic.   BELOW ARE SYMPTOMS THAT SHOULD BE REPORTED IMMEDIATELY:  *FEVER GREATER THAN 100.5 F  *CHILLS WITH OR WITHOUT FEVER  NAUSEA AND VOMITING THAT IS NOT CONTROLLED WITH YOUR NAUSEA MEDICATION  *UNUSUAL SHORTNESS OF BREATH  *UNUSUAL BRUISING OR BLEEDING  TENDERNESS IN MOUTH AND THROAT WITH OR WITHOUT PRESENCE OF ULCERS  *URINARY PROBLEMS  *BOWEL PROBLEMS  UNUSUAL RASH Items with * indicate a potential emergency and should be followed up as soon as possible.  Feel free to call the clinic you have any questions or concerns. The clinic phone number is [phone].  Please show the CHEMO ALERT CARD at check-in to the Emergency Department and triage nurse.   

## 2015-10-28 ENCOUNTER — Ambulatory Visit
Admission: RE | Admit: 2015-10-28 | Discharge: 2015-10-28 | Disposition: A | Discharge status: Home / Self Care | Payer: BC Managed Care – PPO | Source: Ambulatory Visit | Attending: Radiation Oncology | Admitting: Radiation Oncology

## 2015-10-28 ENCOUNTER — Encounter: Payer: Self-pay | Admitting: Radiation Oncology

## 2015-10-28 VITALS — BP 121/76 | HR 79 | Temp 98.5°F | Ht 64.0 in | Wt 188.6 lb

## 2015-10-28 DIAGNOSIS — C541 Malignant neoplasm of endometrium: Secondary | ICD-10-CM | POA: Insufficient documentation

## 2015-10-28 DIAGNOSIS — C7982 Secondary malignant neoplasm of genital organs: Secondary | ICD-10-CM | POA: Insufficient documentation

## 2015-10-28 NOTE — Progress Notes (Signed)
Ms. Mencer here today for FU S/P XRT for endometrial cancer which completed on 04/20/15.  She reports that she continues to have bleeding after intercourse -"spotting" and denies any pain.  Denies any fatigue.   BP 121/76 (BP Location: Left Arm, Patient Position: Sitting, Cuff Size: Normal)   Pulse 79   Temp 98.5 F (36.9 C) (Oral)   Ht 5\' 4"  (1.626 m)   Wt 188 lb 9.6 oz (85.5 kg)   BMI 32.37 kg/m     Wt Readings from Last 3 Encounters:  10/28/15 188 lb 9.6 oz (85.5 kg)  09/22/15 183 lb (83 kg)  08/26/15 184 lb (83.5 kg)

## 2015-10-28 NOTE — Progress Notes (Signed)
Radiation Oncology         [phone] ________________________________  Name: Emily Greer MRN: [id number]  Date: 10/28/2015  DOB: Oct 25, 1950  Follow-Up Visit Note  CC: Foye Spurling, MD  Everitt Amber, MD    ICD-9-CM ICD-10-CM   1. 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  June 4, June 11, June 19, June 25, July 29, 2012: Vaginal HDR, 30.5 Gy in 5 fractions (6 Gy, 6 Gy, 6 Gy, 6 Gy, 6.5 Gy)  Narrative:  The patient returns today for routine follow-up. She reports that she continues to have bleeding after intercourse (spotting). She denies any pain or fatigue. Continues to use vaginal dilator  CT of the chest/abd/pelvis on 07/26/15 demonstrated no evidence of metastatic endometrial carcinoma and stable widespread osseous findings attributed to multiple myeloma.  The patient followed up with Dr. Skeet Latch on 08/19/15 who noted the vaginal apex was smooth to touch and friable with manipulation.  ALLERGIES:  is allergic to codeine.  Meds: Current Outpatient Prescriptions  Medication Sig Dispense Refill  . acyclovir (ZOVIRAX) 400 MG tablet Take 1 tablet (400 mg total) by mouth 2 (two) times daily. 60 tablet 2  . albuterol (PROVENTIL HFA;VENTOLIN HFA) 108 (90 BASE) MCG/ACT inhaler Inhale 2 puffs into the lungs every 6 (six) hours as needed for wheezing or shortness of breath. 2 puffs 3 times daily x 5 days then every 6 hours as needed. 1 Inhaler 0  . amLODipine (NORVASC) 10 MG tablet TAKE 1 TABLET BY MOUTH DAILY    . amoxicillin (AMOXIL) 500 MG capsule 2,000 mg. Prior to dental procedure.    . bortezomib IV (VELCADE) 3.5 MG injection Inject 1.3 mg/m2 into the vein once. Patient is taking 2.5 MG injection every other week    . Cholecalciferol (VITAMIN D3) 2000 UNITS  TABS Take 1 tablet by mouth daily.    [name] Kitchen letrozole (FEMARA) 2.5 MG tablet Take 1 tablet (2.5 mg total) by mouth daily. 60 tablet 4  . loperamide (IMODIUM) 2 MG capsule Take by mouth as needed for diarrhea or loose stools. Reported on 08/19/2015    . loratadine (CLARITIN) 10 MG tablet Take 10 mg by mouth every morning. Reported on 08/19/2015    . loratadine-pseudoephedrine (CLARITIN-D 24-HOUR) 10-240 MG 24 hr tablet Take 1 tablet by mouth as needed. Reported on 08/19/2015    . ondansetron (ZOFRAN) 8 MG tablet Take 8 mg by mouth every 8 (eight) hours as needed for nausea or vomiting. Reported on 08/19/2015    . oxymetazoline (AFRIN) 0.05 % nasal spray Place 1 spray into both nostrils 2 (two) times daily as needed. Reported on 01/29/2015    . pomalidomide (POMALYST) 1 MG capsule TAKE 1 CAPSULE (1 MG TOTAL) BY MOUTH DAILY ON DAYS 1-21. REPEAT EVERY28 DAYS. TAKE WITH WATER. Auth# [id number] 21 capsule 0  . potassium chloride SA (K-DUR,KLOR-CON) 20 MEQ tablet Take 1 tablet (20 mEq total) by mouth 2 (two) times daily. (Patient taking differently: Take 20 mEq by mouth daily. ) 60 tablet 3  . triamterene-hydrochlorothiazide (MAXZIDE-25) 37.5-25 MG tablet Take 1 tablet by mouth daily. 30 tablet 6   No current facility-administered medications for this encounter.     Physical Findings: The patient is in no acute distress. Patient is alert and oriented.  height  is '5\' 4"'$  (1.626 m) and weight is 188 lb 9.6 oz (85.5 kg). Her oral temperature is 98.5 F (36.9 C). Her blood pressure is 121/76 and her pulse is 79.   Lungs are clear to auscultation bilaterally. Heart has regular rate and rhythm. No palpable cervical, supraclavicular, or axillary adenopathy. Abdomen soft, non-tender, normal bowel sounds.  On pelvic examination the external genitalia were unremarkable. A speculum exam was performed. Only a small  speculum could be placed. The vaginal vault is shortened. There is some radiation changes noted in the proximal  vagina. There are no mucosal lesions noted in the vaginal vault. The mucosal tissues bleed easily on exam. On bimanual and rectovaginal examination there were no pelvic masses appreciated.  Lab Findings: Lab Results  Component Value Date   WBC 4.5 10/20/2015   HGB 12.2 10/20/2015   HCT 36.5 10/20/2015   MCV 81 10/20/2015   PLT 192 10/20/2015    Radiographic Findings: No results found.  Impression: No evidence of recurrence on clinical exam today.  Plan: The patient is scheduled to follow up with Dr. Leo Grosser in October with a Pap smear then. She is also scheduled to follow up with Dr. Marin Olp on 11/03/15 for her myeloma and she is scheduled to follow up with Dr. Skeet Latch in January 2018. She will follow up with me in April 2018. ____________________________________  Blair Promise, PhD, MD  This document serves as a record of services personally performed by Gery Pray, MD. It was created on his behalf by Darcus Austin, a trained medical scribe. The creation of this record is based on the scribe's personal observations and the provider's statements to them. This document has been checked and approved by the attending provider.

## 2015-11-01 ENCOUNTER — Other Ambulatory Visit: Payer: Self-pay | Admitting: Hematology & Oncology

## 2015-11-03 ENCOUNTER — Ambulatory Visit (HOSPITAL_BASED_OUTPATIENT_CLINIC_OR_DEPARTMENT_OTHER): Payer: BC Managed Care – PPO | Admitting: Hematology & Oncology

## 2015-11-03 ENCOUNTER — Ambulatory Visit (HOSPITAL_BASED_OUTPATIENT_CLINIC_OR_DEPARTMENT_OTHER): Payer: BC Managed Care – PPO

## 2015-11-03 ENCOUNTER — Other Ambulatory Visit: Payer: BC Managed Care – PPO

## 2015-11-03 ENCOUNTER — Ambulatory Visit: Payer: BC Managed Care – PPO | Admitting: Hematology & Oncology

## 2015-11-03 ENCOUNTER — Ambulatory Visit: Payer: BC Managed Care – PPO

## 2015-11-03 ENCOUNTER — Other Ambulatory Visit (HOSPITAL_BASED_OUTPATIENT_CLINIC_OR_DEPARTMENT_OTHER): Payer: BC Managed Care – PPO

## 2015-11-03 VITALS — BP 117/71 | HR 76 | Temp 98.4°F | Resp 18 | Wt 184.0 lb

## 2015-11-03 DIAGNOSIS — C9000 Multiple myeloma not having achieved remission: Secondary | ICD-10-CM

## 2015-11-03 DIAGNOSIS — Z8579 Personal history of other malignant neoplasms of lymphoid, hematopoietic and related tissues: Secondary | ICD-10-CM

## 2015-11-03 DIAGNOSIS — C9002 Multiple myeloma in relapse: Secondary | ICD-10-CM

## 2015-11-03 DIAGNOSIS — C541 Malignant neoplasm of endometrium: Secondary | ICD-10-CM

## 2015-11-03 DIAGNOSIS — Z5112 Encounter for antineoplastic immunotherapy: Secondary | ICD-10-CM

## 2015-11-03 DIAGNOSIS — Z23 Encounter for immunization: Secondary | ICD-10-CM

## 2015-11-03 DIAGNOSIS — C7982 Secondary malignant neoplasm of genital organs: Secondary | ICD-10-CM

## 2015-11-03 LAB — CBC WITH DIFFERENTIAL (CANCER CENTER ONLY)
BASO#: 0.1 10e3/uL (ref 0.0–0.2)
BASO%: 1.7 % (ref 0.0–2.0)
EOS%: 3.5 % (ref 0.0–7.0)
Eosinophils Absolute: 0.1 10e3/uL (ref 0.0–0.5)
HCT: 33.3 % — ABNORMAL LOW (ref 34.8–46.6)
HGB: 11.4 g/dL — ABNORMAL LOW (ref 11.6–15.9)
LYMPH#: 1.2 10e3/uL (ref 0.9–3.3)
LYMPH%: 30.2 % (ref 14.0–48.0)
MCH: 27.7 pg (ref 26.0–34.0)
MCHC: 34.2 g/dL (ref 32.0–36.0)
MCV: 81 fL (ref 81–101)
MONO#: 0.6 10e3/uL (ref 0.1–0.9)
MONO%: 14.2 % — ABNORMAL HIGH (ref 0.0–13.0)
NEUT#: 2 10e3/uL (ref 1.5–6.5)
NEUT%: 50.4 % (ref 39.6–80.0)
Platelets: 231 10e3/uL (ref 145–400)
RBC: 4.11 10e6/uL (ref 3.70–5.32)
RDW: 16.5 % — ABNORMAL HIGH (ref 11.1–15.7)
WBC: 4 10e3/uL (ref 3.9–10.0)

## 2015-11-03 LAB — CMP (CANCER CENTER ONLY)
ALT(SGPT): 24 U/L (ref 10–47)
AST: 23 U/L (ref 11–38)
Albumin: 3.7 g/dL (ref 3.3–5.5)
Alkaline Phosphatase: 39 U/L (ref 26–84)
BUN, Bld: 14 mg/dL (ref 7–22)
CO2: 26 meq/L (ref 18–33)
Calcium: 9.7 mg/dL (ref 8.0–10.3)
Chloride: 105 meq/L (ref 98–108)
Creat: 0.9 mg/dL (ref 0.6–1.2)
Glucose, Bld: 93 mg/dL (ref 73–118)
Potassium: 3.6 meq/L (ref 3.3–4.7)
Sodium: 134 meq/L (ref 128–145)
Total Bilirubin: 0.4 mg/dL (ref 0.20–1.60)
Total Protein: 7.3 g/dL (ref 6.4–8.1)

## 2015-11-03 LAB — LACTATE DEHYDROGENASE: LDH: 184 U/L (ref 125–245)

## 2015-11-03 MED ORDER — SODIUM CHLORIDE 0.9 % IV SOLN
INTRAVENOUS | Status: DC
  Administered 2015-11-03: 13:00:00 via INTRAVENOUS

## 2015-11-03 MED ORDER — SODIUM CHLORIDE 0.9 % IV SOLN
90.0000 mg | Freq: Once | INTRAVENOUS | Status: AC
  Administered 2015-11-03: 90 mg via INTRAVENOUS
  Filled 2015-11-03: qty 10

## 2015-11-03 MED ORDER — SODIUM CHLORIDE 0.45 % IV SOLN: INTRAVENOUS | Status: DC

## 2015-11-03 MED ORDER — ONDANSETRON HCL 8 MG PO TABS
ORAL_TABLET | ORAL | Status: AC
  Filled 2015-11-03: qty 1

## 2015-11-03 MED ORDER — PROCHLORPERAZINE MALEATE 10 MG PO TABS
ORAL_TABLET | ORAL | Status: AC
  Filled 2015-11-03: qty 1

## 2015-11-03 MED ORDER — INFLUENZA VAC SPLIT QUAD 0.5 ML IM SUSY
0.5000 mL | PREFILLED_SYRINGE | Freq: Once | INTRAMUSCULAR | Status: AC
  Administered 2015-11-03: 0.5 mL via INTRAMUSCULAR
  Filled 2015-11-03: qty 0.5

## 2015-11-03 MED ORDER — BORTEZOMIB CHEMO SQ INJECTION 3.5 MG (2.5MG/ML)
1.2000 mg/m2 | Freq: Once | INTRAMUSCULAR | Status: AC
  Administered 2015-11-03: 2.5 mg via SUBCUTANEOUS
  Filled 2015-11-03: qty 1

## 2015-11-03 MED ORDER — ONDANSETRON HCL 8 MG PO TABS
8.0000 mg | ORAL_TABLET | Freq: Once | ORAL | Status: AC
  Administered 2015-11-03: 8 mg via ORAL

## 2015-11-03 NOTE — Progress Notes (Signed)
Hematology and Oncology Follow Up Visit  VIRDA BETTERS [id number] 09-03-1950 65 y.o. 11/03/2015   Principle Diagnosis:   Recurrent lambda light chain myeloma  History of recurrent endometrial carcinoma  Current Therapy:    Status post second autologous stem cell transplant on 07/24/2014  Maintenance therapy with Pomalidomide/every 2 week Velcade  Aredia 60 mg IV Q. 3 month - next dose on 01/2016  Radiation therapy for endometrial recurrence - completed 04/20/2015  Femara 2.5mg  po q day     Interim History:  Ms.  Moffitt is back for followup. She looks okay. She is a little bit tired. She has been going down to [place] to help out with her parents. They're quite elderly. She goes down quite often. Now that she is retired, she does have the extra time to be able to go down to help out. She is trying to get them to move up in 2 [place].   Her myeloma has been doing quite well. She's had no issues with the Pomalidomide/Velcade.   Her last serum lambda light chain was 16.7 mg/L. This is holding steady.   She has had no problem with the recurrence of the endometrial carcinoma. She had radiation for this that was completed back in March 2017.   She is due for her Aredia today.   Overall, her performance status is ECOG 1.  Medications: Allergies:  Allergies  Allergen Reactions  . Codeine Nausea Only    Past Medical History, Surgical history, Social history, and Family History were reviewed and updated.  Review of Systems: As above  Physical Exam:  weight is 184 lb (83.5 kg). Her oral temperature is 98.4 F (36.9 C). Her blood pressure is 117/71 and her pulse is 76. Her respiration is 18.   Well-developed and well-nourished [name]. Head and neck exam shows no ocular or oral lesion. There are no palpable cervical or supraclavicular lymph nodes. Lungs are clear. Cardiac exam regular rate and rhythm with no murmurs rubs or bruits. Abdomen is soft.  She has good bowel sounds. There is no fluid wave. There is no palpable liver or spleen tip. Extremities shows no clubbing, cyanosis or edema. Neurological exam shows no focal neurological deficits. Skin exam no rashes, ecchymosis or petechia.  Lab Results  Component Value Date   WBC 4.0 11/03/2015   HGB 11.4 (L) 11/03/2015   HCT 33.3 (L) 11/03/2015   MCV 81 11/03/2015   PLT 231 11/03/2015     Chemistry      Component Value Date/Time   NA 134 11/03/2015 1113   NA 141 03/17/2015 1018   K 3.6 11/03/2015 1113   K 3.2 (L) 03/17/2015 1018   CL 105 11/03/2015 1113   CO2 26 11/03/2015 1113   CO2 25 03/17/2015 1018   BUN 14 11/03/2015 1113   BUN 21.3 03/17/2015 1018   CREATININE 0.9 11/03/2015 1113   CREATININE 1.0 03/17/2015 1018      Component Value Date/Time   CALCIUM 9.7 11/03/2015 1113   CALCIUM 9.6 03/17/2015 1018   ALKPHOS 39 11/03/2015 1113   ALKPHOS 47 03/17/2015 1018   AST 23 11/03/2015 1113   AST 17 03/17/2015 1018   ALT 24 11/03/2015 1113   ALT 14 03/17/2015 1018   BILITOT 0.40 11/03/2015 1113   BILITOT 0.54 03/17/2015 1018         Impression and Plan: Ms. Nugent is 64 year old female. She has recurrent lambda light chain myeloma. She also had recurrent endometrial cancer. The recurrence  was localized. She is being followed by OB/GYN and radiation oncology for this. She completed radiation therapy back in March. She had 50.4 rad for this.   We will go ahead with Velcade today. We do this every 2 weeks.  She's also due for Aredia today.   We will plan to have her come back in 2 weeks just for Velcade.  We will plan to see her back for Velcade in another month.   I'm just thankful that she is done so well. I know that she will continued to do well so that she can help her family.   [name], MD 10/4/201712:31 PM

## 2015-11-03 NOTE — Patient Instructions (Addendum)
[facility] Discharge Instructions for Patients Receiving Chemotherapy  Today you received the following chemotherapy agents Velcade and Aredia  To help prevent nausea and vomiting after your treatment, we encourage you to take your nausea medication.   If you develop nausea and vomiting that is not controlled by your nausea medication, call the clinic.   BELOW ARE SYMPTOMS THAT SHOULD BE REPORTED IMMEDIATELY:  *FEVER GREATER THAN 100.5 F  *CHILLS WITH OR WITHOUT FEVER  NAUSEA AND VOMITING THAT IS NOT CONTROLLED WITH YOUR NAUSEA MEDICATION  *UNUSUAL SHORTNESS OF BREATH  *UNUSUAL BRUISING OR BLEEDING  TENDERNESS IN MOUTH AND THROAT WITH OR WITHOUT PRESENCE OF ULCERS  *URINARY PROBLEMS  *BOWEL PROBLEMS  UNUSUAL RASH Items with * indicate a potential emergency and should be followed up as soon as possible.  Feel free to call the clinic you have any questions or concerns. The clinic phone number is [phone].  Please show the [place] at check-in to the Emergency Department and triage nurse.  Influenza Virus Vaccine injection What is this medicine? INFLUENZA VIRUS VACCINE (in floo EN zuh VAHY ruhs vak SEEN) helps to reduce the risk of getting influenza also known as the flu. The vaccine only helps protect you against some strains of the flu. This medicine may be used for other purposes; ask your health care provider or pharmacist if you have questions. What should I tell my health care provider before I take this medicine? They need to know if you have any of these conditions: -bleeding disorder like hemophilia -fever or infection -Guillain-Barre syndrome or other neurological problems -immune system problems -infection with the human immunodeficiency virus (HIV) or AIDS -low blood platelet counts -multiple sclerosis -an unusual or allergic reaction to influenza virus vaccine, latex, other medicines, foods, dyes, or preservatives. Different brands  of vaccines contain different allergens. Some may contain latex or eggs. Talk to your doctor about your allergies to make sure that you get the right vaccine. -pregnant or trying to get pregnant -breast-feeding How should I use this medicine? This vaccine is for injection into a muscle or under the skin. It is given by a health care professional. A copy of Vaccine Information Statements will be given before each vaccination. Read this sheet carefully each time. The sheet may change frequently. Talk to your healthcare provider to see which vaccines are right for you. Some vaccines should not be used in all age groups. Overdosage: If you think you have taken too much of this medicine contact a poison control center or emergency room at once. NOTE: This medicine is only for you. Do not share this medicine with others. What if I miss a dose? This does not apply. What may interact with this medicine? -chemotherapy or radiation therapy -medicines that lower your immune system like etanercept, anakinra, infliximab, and adalimumab -medicines that treat or prevent blood clots like warfarin -phenytoin -steroid medicines like prednisone or cortisone -theophylline -vaccines This list may not describe all possible interactions. Give your health care provider a list of all the medicines, herbs, non-prescription drugs, or dietary supplements you use. Also tell them if you smoke, drink alcohol, or use illegal drugs. Some items may interact with your medicine. What should I watch for while using this medicine? Report any side effects that do not go away within 3 days to your doctor or health care professional. Call your health care provider if any unusual symptoms occur within 6 weeks of receiving this vaccine. You may still catch  the flu, but the illness is not usually as bad. You cannot get the flu from the vaccine. The vaccine will not protect against colds or other illnesses that may cause fever. The  vaccine is needed every year. What side effects may I notice from receiving this medicine? Side effects that you should report to your doctor or health care professional as soon as possible: -allergic reactions like skin rash, itching or hives, swelling of the face, lips, or tongue Side effects that usually do not require medical attention (report to your doctor or health care professional if they continue or are bothersome): -fever -headache -muscle aches and pains -pain, tenderness, redness, or swelling at the injection site -tiredness This list may not describe all possible side effects. Call your doctor for medical advice about side effects. You may report side effects to FDA at 1-800-FDA-1088. Where should I keep my medicine? The vaccine will be given by a health care professional in a clinic, pharmacy, doctor's office, or other health care setting. You will not be given vaccine doses to store at home. NOTE: This sheet is a summary. It may not cover all possible information. If you have questions about this medicine, talk to your doctor, pharmacist, or health care provider.    2016, Elsevier/Gold Standard. (2014-08-07 10:07:28)

## 2015-11-04 LAB — KAPPA/LAMBDA LIGHT CHAINS
Ig Kappa Free Light Chain: 19.8 mg/L — ABNORMAL HIGH (ref 3.3–19.4)
Ig Lambda Free Light Chain: 15.1 mg/L (ref 5.7–26.3)
Kappa/Lambda FluidC Ratio: 1.31 (ref 0.26–1.65)

## 2015-11-08 LAB — MULTIPLE MYELOMA PANEL, SERUM
Albumin SerPl Elph-Mcnc: 4 g/dL (ref 2.9–4.4)
Albumin/Glob SerPl: 1.2 (ref 0.7–1.7)
Alpha 1: 0.2 g/dL (ref 0.0–0.4)
Alpha2 Glob SerPl Elph-Mcnc: 0.8 g/dL (ref 0.4–1.0)
B-Globulin SerPl Elph-Mcnc: 1.3 g/dL (ref 0.7–1.3)
Gamma Glob SerPl Elph-Mcnc: 1.1 g/dL (ref 0.4–1.8)
Globulin, Total: 3.4 g/dL (ref 2.2–3.9)
IgA, Qn, Serum: 167 mg/dL (ref 87–352)
IgG, Qn, Serum: 1188 mg/dL (ref 700–1600)
IgM, Qn, Serum: 36 mg/dL (ref 26–217)
M Protein SerPl Elph-Mcnc: 0.3 g/dL — ABNORMAL HIGH
Total Protein: 7.4 g/dL (ref 6.0–8.5)

## 2015-11-15 ENCOUNTER — Other Ambulatory Visit: Payer: Self-pay | Admitting: Hematology & Oncology

## 2015-11-15 DIAGNOSIS — I10 Essential (primary) hypertension: Secondary | ICD-10-CM

## 2015-11-15 DIAGNOSIS — C9001 Multiple myeloma in remission: Secondary | ICD-10-CM

## 2015-11-15 DIAGNOSIS — C9000 Multiple myeloma not having achieved remission: Secondary | ICD-10-CM

## 2015-11-17 ENCOUNTER — Other Ambulatory Visit (HOSPITAL_BASED_OUTPATIENT_CLINIC_OR_DEPARTMENT_OTHER): Payer: BC Managed Care – PPO

## 2015-11-17 ENCOUNTER — Ambulatory Visit (HOSPITAL_BASED_OUTPATIENT_CLINIC_OR_DEPARTMENT_OTHER): Payer: BC Managed Care – PPO

## 2015-11-17 VITALS — BP 117/77 | HR 69 | Temp 98.2°F | Resp 20

## 2015-11-17 DIAGNOSIS — C541 Malignant neoplasm of endometrium: Secondary | ICD-10-CM

## 2015-11-17 DIAGNOSIS — Z5112 Encounter for antineoplastic immunotherapy: Secondary | ICD-10-CM

## 2015-11-17 DIAGNOSIS — C9000 Multiple myeloma not having achieved remission: Secondary | ICD-10-CM | POA: Diagnosis not present

## 2015-11-17 LAB — CBC WITH DIFFERENTIAL (CANCER CENTER ONLY)
BASO#: 0.1 10*3/uL (ref 0.0–0.2)
BASO%: 0.9 % (ref 0.0–2.0)
EOS%: 3.3 % (ref 0.0–7.0)
Eosinophils Absolute: 0.2 10*3/uL (ref 0.0–0.5)
HCT: 34.6 % — ABNORMAL LOW (ref 34.8–46.6)
HGB: 11.9 g/dL (ref 11.6–15.9)
LYMPH#: 1.3 10*3/uL (ref 0.9–3.3)
LYMPH%: 24.7 % (ref 14.0–48.0)
MCH: 27.8 pg (ref 26.0–34.0)
MCHC: 34.4 g/dL (ref 32.0–36.0)
MCV: 81 fL (ref 81–101)
MONO#: 0.6 10*3/uL (ref 0.1–0.9)
MONO%: 10.4 % (ref 0.0–13.0)
NEUT#: 3.3 10*3/uL (ref 1.5–6.5)
NEUT%: 60.7 % (ref 39.6–80.0)
Platelets: 209 10*3/uL (ref 145–400)
RBC: 4.28 10*6/uL (ref 3.70–5.32)
RDW: 16.7 % — ABNORMAL HIGH (ref 11.1–15.7)
WBC: 5.4 10*3/uL (ref 3.9–10.0)

## 2015-11-17 LAB — CMP (CANCER CENTER ONLY)
ALT(SGPT): 24 U/L (ref 10–47)
AST: 24 U/L (ref 11–38)
Albumin: 3.9 g/dL (ref 3.3–5.5)
Alkaline Phosphatase: 44 U/L (ref 26–84)
BUN, Bld: 17 mg/dL (ref 7–22)
CO2: 27 mEq/L (ref 18–33)
Calcium: 9.2 mg/dL (ref 8.0–10.3)
Chloride: 100 mEq/L (ref 98–108)
Creat: 0.9 mg/dl (ref 0.6–1.2)
Glucose, Bld: 82 mg/dL (ref 73–118)
Potassium: 3.2 mEq/L — ABNORMAL LOW (ref 3.3–4.7)
Sodium: 139 mEq/L (ref 128–145)
Total Bilirubin: 0.6 mg/dl (ref 0.20–1.60)
Total Protein: 7.6 g/dL (ref 6.4–8.1)

## 2015-11-17 MED ORDER — ONDANSETRON HCL 8 MG PO TABS
8.0000 mg | ORAL_TABLET | Freq: Once | ORAL | Status: AC
  Administered 2015-11-17: 8 mg via ORAL

## 2015-11-17 MED ORDER — BORTEZOMIB CHEMO SQ INJECTION 3.5 MG (2.5MG/ML)
1.2000 mg/m2 | Freq: Once | INTRAMUSCULAR | Status: AC
  Administered 2015-11-17: 2.5 mg via SUBCUTANEOUS
  Filled 2015-11-17: qty 2.5

## 2015-11-17 MED ORDER — ONDANSETRON HCL 8 MG PO TABS
ORAL_TABLET | ORAL | Status: AC
Start: 2015-11-17 — End: 2015-11-17
  Filled 2015-11-17: qty 1

## 2015-11-17 NOTE — Patient Instructions (Signed)
[facility] Discharge Instructions for Patients Receiving Chemotherapy  Today you received the following chemotherapy agents Velcade  To help prevent nausea and vomiting after your treatment, we encourage you to take your nausea medication    If you develop nausea and vomiting that is not controlled by your nausea medication, call the clinic.   BELOW ARE SYMPTOMS THAT SHOULD BE REPORTED IMMEDIATELY:  *FEVER GREATER THAN 100.5 F  *CHILLS WITH OR WITHOUT FEVER  NAUSEA AND VOMITING THAT IS NOT CONTROLLED WITH YOUR NAUSEA MEDICATION  *UNUSUAL SHORTNESS OF BREATH  *UNUSUAL BRUISING OR BLEEDING  TENDERNESS IN MOUTH AND THROAT WITH OR WITHOUT PRESENCE OF ULCERS  *URINARY PROBLEMS  *BOWEL PROBLEMS  UNUSUAL RASH Items with * indicate a potential emergency and should be followed up as soon as possible.  Feel free to call the clinic you have any questions or concerns. The clinic phone number is [phone].  Please show the CHEMO ALERT CARD at check-in to the Emergency Department and triage nurse.   

## 2015-11-17 NOTE — Progress Notes (Signed)
States had dental fillings x 3 placed yesterday.

## 2015-11-18 ENCOUNTER — Other Ambulatory Visit: Payer: Self-pay | Admitting: Hematology & Oncology

## 2015-11-18 LAB — LACTATE DEHYDROGENASE: LDH: 198 U/L (ref 125–245)

## 2015-11-19 ENCOUNTER — Other Ambulatory Visit: Payer: Self-pay | Admitting: *Deleted

## 2015-11-19 MED ORDER — POMALIDOMIDE 1 MG PO CAPS: ORAL_CAPSULE | ORAL | 0 refills | Status: DC

## 2015-11-23 ENCOUNTER — Other Ambulatory Visit: Payer: Self-pay | Admitting: Family

## 2015-11-29 ENCOUNTER — Other Ambulatory Visit: Payer: Self-pay | Admitting: Family

## 2015-11-29 ENCOUNTER — Telehealth: Payer: Self-pay | Admitting: Family

## 2015-11-29 NOTE — Telephone Encounter (Signed)
I spoke with SArah in Dr. Mingo Amber office. Ms. Figeroa needs some teeth extracted at some point. She received Aredia on 11/03/15 so we will need to wait 4 months from that date per Dr. Marin Olp. I will let pharmacy know. Oral surgery will plan to schedule her for the first or second week in February. All questions were answered. They will contact our office once they have scheduled her procedure with the date.

## 2015-11-30 ENCOUNTER — Ambulatory Visit (HOSPITAL_BASED_OUTPATIENT_CLINIC_OR_DEPARTMENT_OTHER): Payer: BC Managed Care – PPO

## 2015-11-30 ENCOUNTER — Other Ambulatory Visit (HOSPITAL_BASED_OUTPATIENT_CLINIC_OR_DEPARTMENT_OTHER): Payer: BC Managed Care – PPO

## 2015-11-30 ENCOUNTER — Ambulatory Visit (HOSPITAL_BASED_OUTPATIENT_CLINIC_OR_DEPARTMENT_OTHER): Payer: BC Managed Care – PPO | Admitting: Hematology & Oncology

## 2015-11-30 ENCOUNTER — Other Ambulatory Visit: Payer: Self-pay | Admitting: *Deleted

## 2015-11-30 VITALS — BP 119/68 | HR 80 | Temp 97.7°F | Resp 18 | Wt 185.4 lb

## 2015-11-30 DIAGNOSIS — C9002 Multiple myeloma in relapse: Secondary | ICD-10-CM

## 2015-11-30 DIAGNOSIS — Z5111 Encounter for antineoplastic chemotherapy: Secondary | ICD-10-CM | POA: Diagnosis not present

## 2015-11-30 DIAGNOSIS — C9000 Multiple myeloma not having achieved remission: Secondary | ICD-10-CM

## 2015-11-30 LAB — CMP (CANCER CENTER ONLY)
ALT(SGPT): 20 U/L (ref 10–47)
AST: 21 U/L (ref 11–38)
Albumin: 3.6 g/dL (ref 3.3–5.5)
Alkaline Phosphatase: 46 U/L (ref 26–84)
BUN, Bld: 15 mg/dL (ref 7–22)
CO2: 27 mEq/L (ref 18–33)
Calcium: 9.4 mg/dL (ref 8.0–10.3)
Chloride: 103 mEq/L (ref 98–108)
Creat: 1 mg/dl (ref 0.6–1.2)
Glucose, Bld: 91 mg/dL (ref 73–118)
Potassium: 4 mEq/L (ref 3.3–4.7)
Sodium: 138 mEq/L (ref 128–145)
Total Bilirubin: 0.6 mg/dl (ref 0.20–1.60)
Total Protein: 7.5 g/dL (ref 6.4–8.1)

## 2015-11-30 LAB — CBC WITH DIFFERENTIAL (CANCER CENTER ONLY)
BASO#: 0.1 10*3/uL (ref 0.0–0.2)
BASO%: 1.4 % (ref 0.0–2.0)
EOS%: 3.7 % (ref 0.0–7.0)
Eosinophils Absolute: 0.1 10*3/uL (ref 0.0–0.5)
HCT: 32.8 % — ABNORMAL LOW (ref 34.8–46.6)
HGB: 11 g/dL — ABNORMAL LOW (ref 11.6–15.9)
LYMPH#: 1.2 10*3/uL (ref 0.9–3.3)
LYMPH%: 35.2 % (ref 14.0–48.0)
MCH: 27.2 pg (ref 26.0–34.0)
MCHC: 33.5 g/dL (ref 32.0–36.0)
MCV: 81 fL (ref 81–101)
MONO#: 0.5 10*3/uL (ref 0.1–0.9)
MONO%: 15 % — ABNORMAL HIGH (ref 0.0–13.0)
NEUT#: 1.6 10*3/uL (ref 1.5–6.5)
NEUT%: 44.7 % (ref 39.6–80.0)
Platelets: 210 10*3/uL (ref 145–400)
RBC: 4.04 10*6/uL (ref 3.70–5.32)
RDW: 16.9 % — ABNORMAL HIGH (ref 11.1–15.7)
WBC: 3.5 10*3/uL — ABNORMAL LOW (ref 3.9–10.0)

## 2015-11-30 MED ORDER — BORTEZOMIB CHEMO SQ INJECTION 3.5 MG (2.5MG/ML)
1.2000 mg/m2 | Freq: Once | INTRAMUSCULAR | Status: AC
  Administered 2015-11-30: 2.5 mg via SUBCUTANEOUS
  Filled 2015-11-30: qty 2.5

## 2015-11-30 MED ORDER — ONDANSETRON HCL 8 MG PO TABS
8.0000 mg | ORAL_TABLET | Freq: Once | ORAL | Status: AC
  Administered 2015-11-30: 8 mg via ORAL

## 2015-11-30 MED ORDER — ACYCLOVIR 400 MG PO TABS: 400.0000 mg | ORAL_TABLET | Freq: Two times a day (BID) | ORAL | 3 refills | Status: DC

## 2015-11-30 MED ORDER — ONDANSETRON HCL 8 MG PO TABS
ORAL_TABLET | ORAL | Status: AC
  Filled 2015-11-30: qty 1

## 2015-11-30 NOTE — Patient Instructions (Signed)
Bortezomib injection What is this medicine? BORTEZOMIB (bor TEZ oh mib) is a medicine that targets proteins in cancer cells and stops the cancer cells from growing. It is used to treat multiple myeloma and mantle-cell lymphoma. This medicine may be used for other purposes; ask your health care provider or pharmacist if you have questions. What should I tell my health care provider before I take this medicine? They need to know if you have any of these conditions: -diabetes -heart disease -irregular heartbeat -liver disease -on hemodialysis -low blood counts, like low white blood cells, platelets, or hemoglobin -peripheral neuropathy -taking medicine for blood pressure -an unusual or allergic reaction to bortezomib, mannitol, boron, other medicines, foods, dyes, or preservatives -pregnant or trying to get pregnant -breast-feeding How should I use this medicine? This medicine is for injection into a vein or for injection under the skin. It is given by a health care professional in a hospital or clinic setting. Talk to your pediatrician regarding the use of this medicine in children. Special care may be needed. Overdosage: If you think you have taken too much of this medicine contact a poison control center or emergency room at once. NOTE: This medicine is only for you. Do not share this medicine with others. What if I miss a dose? It is important not to miss your dose. Call your doctor or health care professional if you are unable to keep an appointment. What may interact with this medicine? This medicine may interact with the following medications: -ketoconazole -rifampin -ritonavir -St. John's Wort This list may not describe all possible interactions. Give your health care provider a list of all the medicines, herbs, non-prescription drugs, or dietary supplements you use. Also tell them if you smoke, drink alcohol, or use illegal drugs. Some items may interact with your medicine. What  should I watch for while using this medicine? Visit your doctor for checks on your progress. This drug may make you feel generally unwell. This is not uncommon, as chemotherapy can affect healthy cells as well as cancer cells. Report any side effects. Continue your course of treatment even though you feel ill unless your doctor tells you to stop. You may get drowsy or dizzy. Do not drive, use machinery, or do anything that needs mental alertness until you know how this medicine affects you. Do not stand or sit up quickly, especially if you are an older patient. This reduces the risk of dizzy or fainting spells. In some cases, you may be given additional medicines to help with side effects. Follow all directions for their use. Call your doctor or health care professional for advice if you get a fever, chills or sore throat, or other symptoms of a cold or flu. Do not treat yourself. This drug decreases your body's ability to fight infections. Try to avoid being around people who are sick. This medicine may increase your risk to bruise or bleed. Call your doctor or health care professional if you notice any unusual bleeding. You may need blood work done while you are taking this medicine. In some patients, this medicine may cause a serious brain infection that may cause death. If you have any problems seeing, thinking, speaking, walking, or standing, tell your doctor right away. If you cannot reach your doctor, urgently seek other source of medical care. Do not become pregnant while taking this medicine. Women should inform their doctor if they wish to become pregnant or think they might be pregnant. There is a potential for serious  side effects to an unborn child. Talk to your health care professional or pharmacist for more information. Do not breast-feed an infant while taking this medicine. Check with your doctor or health care professional if you get an attack of severe diarrhea, nausea and vomiting, or if  you sweat a lot. The loss of too much body fluid can make it dangerous for you to take this medicine. What side effects may I notice from receiving this medicine? Side effects that you should report to your doctor or health care professional as soon as possible: -allergic reactions like skin rash, itching or hives, swelling of the face, lips, or tongue -breathing problems -changes in hearing -changes in vision -fast, irregular heartbeat -feeling faint or lightheaded, falls -pain, tingling, numbness in the hands or feet -right upper belly pain -seizures -swelling of the ankles, feet, hands -unusual bleeding or bruising -unusually weak or tired -vomiting -yellowing of the eyes or skin Side effects that usually do not require medical attention (report to your doctor or health care professional if they continue or are bothersome): -changes in emotions or moods -constipation -diarrhea -loss of appetite -headache -irritation at site where injected -nausea This list may not describe all possible side effects. Call your doctor for medical advice about side effects. You may report side effects to FDA at 1-800-FDA-1088. Where should I keep my medicine? This drug is given in a hospital or clinic and will not be stored at home. NOTE: This sheet is a summary. It may not cover all possible information. If you have questions about this medicine, talk to your doctor, pharmacist, or health care provider.    2016, Elsevier/Gold Standard. (2014-03-17 14:47:04)

## 2015-11-30 NOTE — Progress Notes (Signed)
Hematology and Oncology Follow Up Visit  Emily Greer [id number] 01-Feb-1950 65 y.o. 11/30/2015   Principle Diagnosis:   Recurrent lambda light chain myeloma  History of recurrent endometrial carcinoma  Current Therapy:    Status post second autologous stem cell transplant on 07/24/2014  Maintenance therapy with Pomalidomide/every 2 week Velcade  Aredia 60 mg IV Q. 3 month - next dose on 01/2016  Radiation therapy for endometrial recurrence - completed 04/20/2015  Femara 2.5mg  po q day     Interim History:  [name] is back for followup. The major problem that she is having that she sneezed to have a wisdom tooth taken out. Apparently this is the left upper wisdom tooth. She apparently has had this infected.  Because of her Aredia, she needs to wait 3 or 4 months. She had Aredia in early October. As such, I probably would try to hold off until early February before she has the tooth extracted. I would then have to have her wait 3 more months afterwards before we would restart the Aredia. Thankfully, she will gets Aredia once every 3 months so I think holding for 6 months probably would not be detrimental to her. I'll see that having the tooth taken out would affect her myeloma status.   Her myeloma has been doing quite well. She's had no issues with the Pomalidomide/Velcade.   Her last serum lambda light chain was 15.1 mg/L. This is holding steady.   She has had no problem with the recurrence of the endometrial carcinoma. She had radiation for this that was completed back in March 2017.   Overall, her performance status is ECOG 1.  Medications: Allergies:  Allergies  Allergen Reactions  . Codeine Nausea Only    Past Medical History, Surgical history, Social history, and Family History were reviewed and updated.  Review of Systems: As above  Physical Exam:  weight is 185 lb 6.4 oz (84.1 kg). Her oral temperature is 97.7 F (36.5 C). Her blood pressure is  119/68 and her pulse is 80. Her respiration is 18.   Well-developed and well-nourished [name]. Head and neck exam shows no ocular or oral lesion. There are no palpable cervical or supraclavicular lymph nodes. Lungs are clear. Cardiac exam regular rate and rhythm with no murmurs rubs or bruits. Abdomen is soft. She has good bowel sounds. There is no fluid wave. There is no palpable liver or spleen tip. Extremities shows no clubbing, cyanosis or edema. Neurological exam shows no focal neurological deficits. Skin exam no rashes, ecchymosis or petechia.  Lab Results  Component Value Date   WBC 3.5 (L) 11/30/2015   HGB 11.0 (L) 11/30/2015   HCT 32.8 (L) 11/30/2015   MCV 81 11/30/2015   PLT 210 11/30/2015     Chemistry      Component Value Date/Time   NA 138 11/30/2015 0922   NA 141 03/17/2015 1018   K 4.0 11/30/2015 0922   K 3.2 (L) 03/17/2015 1018   CL 103 11/30/2015 0922   CO2 27 11/30/2015 0922   CO2 25 03/17/2015 1018   BUN 15 11/30/2015 0922   BUN 21.3 03/17/2015 1018   CREATININE 1.0 11/30/2015 0922   CREATININE 1.0 03/17/2015 1018      Component Value Date/Time   CALCIUM 9.4 11/30/2015 0922   CALCIUM 9.6 03/17/2015 1018   ALKPHOS 46 11/30/2015 0922   ALKPHOS 47 03/17/2015 1018   AST 21 11/30/2015 0922   AST 17 03/17/2015 1018  ALT 20 11/30/2015 0922   ALT 14 03/17/2015 1018   BILITOT 0.60 11/30/2015 0922   BILITOT 0.54 03/17/2015 1018         Impression and Plan: Emily Greer is 65 year old female. She has recurrent lambda light chain myeloma. She also had recurrent endometrial cancer. The recurrence was localized. She is being followed by OB/GYN and radiation oncology for this. She completed radiation therapy back in March. She had 50.4 rad for this.   We will go ahead with Velcade today. We do this every 2 weeks.  Again, we will hold on the Aredia probably until next May. I did talk to her about this. We certainly can have the oral surgeon call us  if there are any questions.   We will continue the Velcade every 2 weeks.  I think we probably get her back in 6 weeks now.  Emily Napoleon, MD 10/31/201710:16 AM

## 2015-12-01 LAB — KAPPA/LAMBDA LIGHT CHAINS
Ig Kappa Free Light Chain: 26.7 mg/L — ABNORMAL HIGH (ref 3.3–19.4)
Ig Lambda Free Light Chain: 19.9 mg/L (ref 5.7–26.3)
Kappa/Lambda FluidC Ratio: 1.34 (ref 0.26–1.65)

## 2015-12-02 ENCOUNTER — Telehealth: Payer: Self-pay | Admitting: Gynecologic Oncology

## 2015-12-02 NOTE — Telephone Encounter (Signed)
Spoke with patient and advised her that Dr. Skeet Latch felt it was fine for her to wait until November 30 to be seen for evaluation of the ASCUS pap with positive HPV performed by Dr. Skeet Latch.  Appt has been made.  Advised to call for any needs or concerns.

## 2015-12-03 LAB — PROTEIN ELECTROPHORESIS, SERUM, WITH REFLEX
A/G Ratio: 1.1 (ref 0.7–1.7)
Albumin: 3.7 g/dL (ref 2.9–4.4)
Alpha 1: 0.2 g/dL (ref 0.0–0.4)
Alpha 2: 0.8 g/dL (ref 0.4–1.0)
Beta: 1.2 g/dL (ref 0.7–1.3)
Gamma Globulin: 1.2 g/dL (ref 0.4–1.8)
Globulin, Total: 3.5 g/dL (ref 2.2–3.9)
IgA, Qn, Serum: 187 mg/dL (ref 87–352)
IgG, Qn, Serum: 1311 mg/dL (ref 700–1600)
IgM, Qn, Serum: 39 mg/dL (ref 26–217)
Interpretation(See Below): 0
M-Spike, %: 0.2 g/dL — ABNORMAL HIGH
PDF: 0
Total Protein: 7.2 g/dL (ref 6.0–8.5)

## 2015-12-15 ENCOUNTER — Ambulatory Visit: Payer: BC Managed Care – PPO

## 2015-12-15 ENCOUNTER — Other Ambulatory Visit (HOSPITAL_BASED_OUTPATIENT_CLINIC_OR_DEPARTMENT_OTHER): Payer: BC Managed Care – PPO

## 2015-12-15 DIAGNOSIS — C9000 Multiple myeloma not having achieved remission: Secondary | ICD-10-CM | POA: Diagnosis not present

## 2015-12-15 DIAGNOSIS — C541 Malignant neoplasm of endometrium: Secondary | ICD-10-CM | POA: Diagnosis not present

## 2015-12-15 LAB — CBC WITH DIFFERENTIAL (CANCER CENTER ONLY)
BASO#: 0 10*3/uL (ref 0.0–0.2)
BASO%: 0.6 % (ref 0.0–2.0)
EOS%: 2.7 % (ref 0.0–7.0)
Eosinophils Absolute: 0.2 10*3/uL (ref 0.0–0.5)
HCT: 32.2 % — ABNORMAL LOW (ref 34.8–46.6)
HGB: 10.7 g/dL — ABNORMAL LOW (ref 11.6–15.9)
LYMPH#: 1.4 10*3/uL (ref 0.9–3.3)
LYMPH%: 21.7 % (ref 14.0–48.0)
MCH: 27.2 pg (ref 26.0–34.0)
MCHC: 33.2 g/dL (ref 32.0–36.0)
MCV: 82 fL (ref 81–101)
MONO#: 0.5 10*3/uL (ref 0.1–0.9)
MONO%: 8.6 % (ref 0.0–13.0)
NEUT#: 4.2 10*3/uL (ref 1.5–6.5)
NEUT%: 66.4 % (ref 39.6–80.0)
Platelets: 223 10*3/uL (ref 145–400)
RBC: 3.93 10*6/uL (ref 3.70–5.32)
RDW: 17.4 % — ABNORMAL HIGH (ref 11.1–15.7)
WBC: 6.3 10*3/uL (ref 3.9–10.0)

## 2015-12-15 LAB — CMP (CANCER CENTER ONLY)
ALT(SGPT): 24 U/L (ref 10–47)
AST: 19 U/L (ref 11–38)
Albumin: 3.6 g/dL (ref 3.3–5.5)
Alkaline Phosphatase: 44 U/L (ref 26–84)
BUN, Bld: 19 mg/dL (ref 7–22)
CO2: 24 mEq/L (ref 18–33)
Calcium: 9.5 mg/dL (ref 8.0–10.3)
Chloride: 105 mEq/L (ref 98–108)
Creat: 1.1 mg/dl (ref 0.6–1.2)
Glucose, Bld: 104 mg/dL (ref 73–118)
Potassium: 3.2 mEq/L — ABNORMAL LOW (ref 3.3–4.7)
Sodium: 139 mEq/L (ref 128–145)
Total Bilirubin: 0.5 mg/dl (ref 0.20–1.60)
Total Protein: 7.8 g/dL (ref 6.4–8.1)

## 2015-12-15 LAB — LACTATE DEHYDROGENASE: LDH: 183 U/L (ref 125–245)

## 2015-12-15 NOTE — Progress Notes (Signed)
11:52 AM States has a sore throat and post nasal drip which she is taking Cipro for.  Dr. Marin Olp notified, will hold treatment today.  Schedule given to patient.

## 2015-12-20 ENCOUNTER — Other Ambulatory Visit: Payer: Self-pay | Admitting: Hematology & Oncology

## 2015-12-25 ENCOUNTER — Other Ambulatory Visit: Payer: Self-pay | Admitting: Hematology & Oncology

## 2015-12-25 DIAGNOSIS — C9000 Multiple myeloma not having achieved remission: Secondary | ICD-10-CM

## 2015-12-25 DIAGNOSIS — C9001 Multiple myeloma in remission: Secondary | ICD-10-CM

## 2015-12-25 DIAGNOSIS — I10 Essential (primary) hypertension: Secondary | ICD-10-CM

## 2015-12-28 ENCOUNTER — Other Ambulatory Visit: Payer: Self-pay | Admitting: *Deleted

## 2015-12-28 DIAGNOSIS — C541 Malignant neoplasm of endometrium: Secondary | ICD-10-CM

## 2015-12-28 NOTE — Progress Notes (Signed)
Office Visit:  GYN ONCOLOGY  CC:   Recurrent endometrial cancer    Assessment:   65 y.o.  year old with Stage1A Grade 2 endometrioid endometrial cancer staged 10/2011 with recurrence at the distal vagina identified in April 2014.  Subsequent PET scan was negative for evidence of metastatic disease and Emily Checketts [place] vaginal  brachytherapy 07/29/2012.  Second vaginal recurrence (apex) diagnosed December 2016. Imaging without evidence of  metastatic disease.    Options discussed at multidisciplinary tumor conference and she was treated with conformational radiotherapy   03/10/2015 - 04/20/2015 Continue Femara.  CT C/A/P  07/2015 without metastatic disease   Abnormal Pap Pap 10/2015 ASCUS HPV + Colposcopy with bx today Follow-up in 03/2016 F/U with Dr. Sondra Come 06/2016 F/U with Dr. Leo Grosser 09/2016   Bilateral femoral necrosis on imaging Counselled on safe ambulation   IHCC positive testing Declined genetic counseling Consider MSI testing of tumor for consideration of  PDL1 checkpoint inhibitor if there is recurrence   HPI:  Emily Greer is a 65 y.o. year old G3P2 initially seen in consultation on 10/05/2011  grade 1  endometrial cancer  She then underwent a  total abdominal hysterectomy bilateral salpingo-oophorectomy bilateral pelvic lymph node dissection on  54/27/0623 without complications.  Her postoperative course was  uncomplicated.  Her final pathologic diagnosis is a Stage  1A Grade  2 endometrioid endometrial cancer with  negative lymphovascular space invasion,  2/20 (10%) of myometrial invasion and negative lymph nodes.  On 01/2012 visit she reported  post coital vaginal bleeding  that was self limiting. Vaginal biopsy - LARGELY DENUDED SQUAMOUS EPITHELIUM WITH ASSOCIATED SPONGIOSIS AND CHRONIC INFLAMMATION.- NO DYSPLASIA OR MALIGNANCY  On the visit 04/2012 she c/o vaginal bleeding.  Biopsies were c/w metastatic disease at the distal vagina  PET 06/05/2012 IMPRESSION:  1.  There are no specific features identified to suggest  hypermetabolic metastasis from endometrial carcinoma.  2. Skeletal changes of multiple myeloma with focal area of increased uptake   Treated with vaginal brachytherapy June 4, June 11, June 19, June 25, June 30/2014 Site/dose: Vagina, 30.5 Gy in 5 fractions (6 Gy, 6 Gy, 6 Gy, 6 Gy, 6.5 Gy)  Pap 08/17/2013 ASCUS HPV neg  She was seen by Dr Leo Grosser on 01/21/15 at which time a friable lesion was noted at the vaginal cuff. This was biopsied and was consistent with endometrioid adenocarcinoma (recurrence).  CT C/A/P 02/15/2015 without evidence of metastatic disease.  Care complicated by successful transplant 2016 for Rx multiple myeloma. Received conformational radiotherapy completed 03/2015 and then started on femara  CT C/A/P 07/26/2015 - PET declined by insurance- 1. Stable examinations demonstrating no evidence of metastatic endometrial carcinoma. 2. Stable widespread osseous findings attributed to multiple myeloma. No evidence of pathologic fracture or epidural tumor. 3. Grossly stable small left thyroid nodule. 4. Chronic bilateral femoral head avascular necrosis.  Interval history: Pap collected by Dr. Leo Grosser in October 2017 ASCUS positive for high risk HPV virus.  Presents for colposcopy. Denies vaginal bleeding no nausea vomiting or changes in weight  Past Medical History:  Diagnosis Date  . Elevated hemoglobin A1c    Borderline  . Endometrial carcinoma ([place]) 05/28/12  . Fibroid 07/26/10   asymptomatic  . H/O multiple myeloma 11/28/1997  . History of radiation therapy 6/4, 6/11, 6/19, 6/25, 07/29/2012   vagina 30.5 gray in 5 fx, HDR brachytherapy  . Hypertension 11/28/97  . Increased BMI 12/18/05  . Lambda light chain myeloma ([place]) 11/11/2007  . Osteoporosis 12/18/05  Increased  risk   . PONV (postoperative nausea and vomiting)    nausea in past, none recent  . Post-menopausal bleeding 12/19/06  . Pregnancy induced  hypertension   . Radiation 03/10/15-04/20/15   vagina 50.4 gray  . Staphylococcus aureus bacteremia 11/28/1997  . Staphylococcus epidermidis bacteremia 11/28/97  . Vaginal atrophy 12/19/06  . Vaginal atrophy      Past Surgical History:  Procedure Laterality Date  . ABDOMINAL HYSTERECTOMY  10/31/2011   Procedure: HYSTERECTOMY ABDOMINAL;  Surgeon: [name], MD PHD;  Location: [facility];  Service: Gynecology;  Laterality: N/A;  . ECTOPIC PREGNANCY SURGERY  1992  . HYSTEROSCOPY W/D&C  09/27/2011   Procedure: DILATATION AND CURETTAGE /HYSTEROSCOPY;  Surgeon: Eldred Manges, MD;  Location: [facility];  Service: Gynecology;;  . LAPAROTOMY  10/31/2011   Procedure: EXPLORATORY LAPAROTOMY;  Surgeon: [name], MD PHD;  Location: [facility];  Service: Gynecology;  Laterality: N/A;  . SALPINGOOPHORECTOMY  10/31/2011   Procedure: SALPINGO OOPHERECTOMY;  Surgeon: [name], MD PHD;  Location: [facility];  Service: Gynecology;  Laterality: Bilateral;   Lymph Nodes sampling  . 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 Just moved to a new home.  Vacation to [place] scheduled for 08/2015  Review of systems: Constitutional:  She has no fever or chills. No changes in weight.  Cardiovascular: No chest pain, palpitations or edema. Respiratory:  No shortness of breath, wheezing or cough Gastrointestinal: She has normal bowel movements without diarrhea or constipation. She denies any nausea or vomiting.   Genitourinary:  She denies pelvic pain, pelvic pressure or changes in her urinary function. + vaginal bleeding (light) post coital Otherwise uninformative 10 point review of system   Physical Exam: Blood pressure 136/74, pulse 74, temperature 98.3 F (36.8 C), temperature source Oral, resp. rate 18, height '5\' 4"'$  (1.626 m), weight 181 lb 12.8 oz (82.5 kg). Wt Readings from Last 3 Encounters:  12/30/15 181 lb 12.8 oz (82.5 kg)   11/30/15 185 lb 6.4 oz (84.1 kg)  11/03/15 184 lb (83.5 kg)   General: Well dressed, well nourished in no apparent distress.   Abdomen:  Soft, nontender, nondistended.  No palpable masses.  No hepatosplenomegaly.  No ascites. Normal bowel sounds.  No hernias.   Genitourinary: Normal EGBUS radiation changes at the vaginal apex.  Vagina foreshortened.  Smooth to touch, friable with manipulation of the speculum, Acetic acid applied, colposcopy performed.  No acetowhite changes, random bx of the vaginal cuff.  Rectal:  Deferred.  Back: No CVA tenderness  LN:  No cervical supraclavicular or inguinal adenopathy

## 2015-12-29 ENCOUNTER — Other Ambulatory Visit: Payer: BC Managed Care – PPO

## 2015-12-29 ENCOUNTER — Ambulatory Visit: Payer: BC Managed Care – PPO

## 2015-12-29 ENCOUNTER — Telehealth: Payer: Self-pay | Admitting: *Deleted

## 2015-12-29 NOTE — Telephone Encounter (Signed)
Patient is to receive an IM dose of antibiotics from her PCP. She wants to make sure this is okay with her current treatment plan. She also wants to know if she should keep her appointment this week for her Velcade.   Spoke to Dr Marin Olp. He wants her to keep her appointment this Friday and is fine with her receiving any antibiotic that is prescribed by her PCP. Patient is aware of recommendations.

## 2015-12-30 ENCOUNTER — Ambulatory Visit: Payer: BC Managed Care – PPO | Attending: Gynecologic Oncology | Admitting: Gynecologic Oncology

## 2015-12-30 ENCOUNTER — Telehealth: Payer: Self-pay | Admitting: Gynecologic Oncology

## 2015-12-30 VITALS — BP 136/74 | HR 74 | Temp 98.3°F | Resp 18 | Ht 64.0 in | Wt 181.8 lb

## 2015-12-30 DIAGNOSIS — M810 Age-related osteoporosis without current pathological fracture: Secondary | ICD-10-CM | POA: Diagnosis not present

## 2015-12-30 DIAGNOSIS — M879 Osteonecrosis, unspecified: Secondary | ICD-10-CM

## 2015-12-30 DIAGNOSIS — Z0189 Encounter for other specified special examinations: Secondary | ICD-10-CM | POA: Diagnosis present

## 2015-12-30 DIAGNOSIS — Z90710 Acquired absence of both cervix and uterus: Secondary | ICD-10-CM | POA: Insufficient documentation

## 2015-12-30 DIAGNOSIS — Z90722 Acquired absence of ovaries, bilateral: Secondary | ICD-10-CM | POA: Insufficient documentation

## 2015-12-30 DIAGNOSIS — N952 Postmenopausal atrophic vaginitis: Secondary | ICD-10-CM

## 2015-12-30 DIAGNOSIS — C9000 Multiple myeloma not having achieved remission: Secondary | ICD-10-CM | POA: Diagnosis not present

## 2015-12-30 DIAGNOSIS — R87629 Unspecified abnormal cytological findings in specimens from vagina: Secondary | ICD-10-CM | POA: Insufficient documentation

## 2015-12-30 DIAGNOSIS — N893 Dysplasia of vagina, unspecified: Secondary | ICD-10-CM | POA: Insufficient documentation

## 2015-12-30 DIAGNOSIS — Z9079 Acquired absence of other genital organ(s): Secondary | ICD-10-CM | POA: Insufficient documentation

## 2015-12-30 DIAGNOSIS — I10 Essential (primary) hypertension: Secondary | ICD-10-CM | POA: Insufficient documentation

## 2015-12-30 DIAGNOSIS — E041 Nontoxic single thyroid nodule: Secondary | ICD-10-CM | POA: Diagnosis not present

## 2015-12-30 DIAGNOSIS — Z79811 Long term (current) use of aromatase inhibitors: Secondary | ICD-10-CM | POA: Insufficient documentation

## 2015-12-30 DIAGNOSIS — C541 Malignant neoplasm of endometrium: Secondary | ICD-10-CM | POA: Diagnosis not present

## 2015-12-30 NOTE — Patient Instructions (Signed)
We will call you with the results of your biopsy from today.  Plan to follow up in March with Dr. Skeet Latch and Dr. Sondra Come in June and Dr. Leo Grosser in Sept 2018.

## 2015-12-30 NOTE — Telephone Encounter (Signed)
Spoke with the patient.  She was thinking her appt was tomorrow.  Advised to come in and we would work her in.

## 2015-12-31 ENCOUNTER — Other Ambulatory Visit (HOSPITAL_BASED_OUTPATIENT_CLINIC_OR_DEPARTMENT_OTHER): Payer: BC Managed Care – PPO

## 2015-12-31 ENCOUNTER — Ambulatory Visit (HOSPITAL_BASED_OUTPATIENT_CLINIC_OR_DEPARTMENT_OTHER): Payer: BC Managed Care – PPO

## 2015-12-31 VITALS — BP 132/66 | HR 73 | Temp 98.1°F | Resp 19

## 2015-12-31 DIAGNOSIS — C541 Malignant neoplasm of endometrium: Secondary | ICD-10-CM

## 2015-12-31 DIAGNOSIS — C9000 Multiple myeloma not having achieved remission: Secondary | ICD-10-CM | POA: Diagnosis not present

## 2015-12-31 DIAGNOSIS — Z5112 Encounter for antineoplastic immunotherapy: Secondary | ICD-10-CM

## 2015-12-31 LAB — CBC WITH DIFFERENTIAL (CANCER CENTER ONLY)
BASO#: 0.1 10*3/uL (ref 0.0–0.2)
BASO%: 1 % (ref 0.0–2.0)
EOS%: 3.8 % (ref 0.0–7.0)
Eosinophils Absolute: 0.2 10*3/uL (ref 0.0–0.5)
HCT: 32.3 % — ABNORMAL LOW (ref 34.8–46.6)
HGB: 10.9 g/dL — ABNORMAL LOW (ref 11.6–15.9)
LYMPH#: 1.5 10*3/uL (ref 0.9–3.3)
LYMPH%: 30.1 % (ref 14.0–48.0)
MCH: 27.6 pg (ref 26.0–34.0)
MCHC: 33.7 g/dL (ref 32.0–36.0)
MCV: 82 fL (ref 81–101)
MONO#: 0.8 10*3/uL (ref 0.1–0.9)
MONO%: 15 % — ABNORMAL HIGH (ref 0.0–13.0)
NEUT#: 2.5 10*3/uL (ref 1.5–6.5)
NEUT%: 50.1 % (ref 39.6–80.0)
Platelets: 228 10*3/uL (ref 145–400)
RBC: 3.95 10*6/uL (ref 3.70–5.32)
RDW: 16.9 % — ABNORMAL HIGH (ref 11.1–15.7)
WBC: 5 10*3/uL (ref 3.9–10.0)

## 2015-12-31 LAB — CMP (CANCER CENTER ONLY)
ALT(SGPT): 17 U/L (ref 10–47)
AST: 22 U/L (ref 11–38)
Albumin: 3.4 g/dL (ref 3.3–5.5)
Alkaline Phosphatase: 42 U/L (ref 26–84)
BUN, Bld: 18 mg/dL (ref 7–22)
CO2: 26 mEq/L (ref 18–33)
Calcium: 9.6 mg/dL (ref 8.0–10.3)
Chloride: 102 mEq/L (ref 98–108)
Creat: 1.4 mg/dl — ABNORMAL HIGH (ref 0.6–1.2)
Glucose, Bld: 106 mg/dL (ref 73–118)
Potassium: 3.1 mEq/L — ABNORMAL LOW (ref 3.3–4.7)
Sodium: 139 mEq/L (ref 128–145)
Total Bilirubin: 0.4 mg/dl (ref 0.20–1.60)
Total Protein: 7.4 g/dL (ref 6.4–8.1)

## 2015-12-31 MED ORDER — BORTEZOMIB CHEMO SQ INJECTION 3.5 MG (2.5MG/ML)
1.2000 mg/m2 | Freq: Once | INTRAMUSCULAR | Status: AC
  Administered 2015-12-31: 2.5 mg via SUBCUTANEOUS
  Filled 2015-12-31: qty 2.5

## 2015-12-31 MED ORDER — ONDANSETRON HCL 8 MG PO TABS
ORAL_TABLET | ORAL | Status: AC
  Filled 2015-12-31: qty 1

## 2015-12-31 MED ORDER — ONDANSETRON HCL 8 MG PO TABS
8.0000 mg | ORAL_TABLET | Freq: Once | ORAL | Status: AC
  Administered 2015-12-31: 8 mg via ORAL

## 2015-12-31 NOTE — Patient Instructions (Signed)
Hypokalemia Hypokalemia means that the amount of potassium in the blood is lower than normal.Potassium is a chemical that helps regulate the amount of fluid in the body (electrolyte). It also stimulates muscle tightening (contraction) and helps nerves work properly.Normally, most of the body's potassium is inside of cells, and only a very small amount is in the blood. Because the amount in the blood is so small, minor changes to potassium levels in the blood can be life-threatening. What are the causes? This condition may be caused by:  Antibiotic medicine.  Diarrhea or vomiting. Taking too much of a medicine that helps you have a bowel movement (laxative) can cause diarrhea and lead to hypokalemia.  Chronic kidney disease (CKD).  Medicines that help the body get rid of excess fluid (diuretics).  Eating disorders, such as bulimia.  Low magnesium levels in the body.  Sweating a lot. What are the signs or symptoms? Symptoms of this condition include:  Weakness.  Constipation.  Fatigue.  Muscle cramps.  Mental confusion.  Skipped heartbeats or irregular heartbeat (palpitations).  Tingling or numbness. How is this diagnosed? This condition is diagnosed with a blood test. How is this treated? Hypokalemia can be treated by taking potassium supplements by mouth or adjusting the medicines that you take. Treatment may also include eating more foods that contain a lot of potassium. If your potassium level is very low, you may need to get potassium through an IV tube in one of your veins and be monitored in the hospital. Follow these instructions at home:  Take over-the-counter and prescription medicines only as told by your health care provider. This includes vitamins and supplements.  Eat a healthy diet. A healthy diet includes fresh fruits and vegetables, whole grains, healthy fats, and lean proteins.  If instructed, eat more foods that contain a lot of potassium, such  as:  Nuts, such as peanuts and pistachios.  Seeds, such as sunflower seeds and pumpkin seeds.  Peas, lentils, and lima beans.  Whole grain and bran cereals and breads.  Fresh fruits and vegetables, such as apricots, avocado, bananas, cantaloupe, kiwi, oranges, tomatoes, asparagus, and potatoes.  Orange juice.  Tomato juice.  Red meats.  Yogurt.  Keep all follow-up visits as told by your health care provider. This is important. Contact a health care provider if:  You have weakness that gets worse.  You feel your heart pounding or racing.  You vomit.  You have diarrhea.  You have diabetes (diabetes mellitus) and you have trouble keeping your blood sugar (glucose) in your target range. Get help right away if:  You have chest pain.  You have shortness of breath.  You have vomiting or diarrhea that lasts for more than 2 days.  You faint. This information is not intended to replace advice given to you by your health care provider. Make sure you discuss any questions you have with your health care provider. Document Released: 01/16/2005 Document Revised: 09/04/2015 Document Reviewed: 09/04/2015 Elsevier Interactive Patient Education  2017 [employer] Inc.  

## 2016-01-04 ENCOUNTER — Telehealth: Payer: Self-pay | Admitting: Gynecologic Oncology

## 2016-01-04 NOTE — Telephone Encounter (Signed)
Patient informed of biopsy results with Dr. Leone Brand recommendations to either follow the area or laser the area.  All questions answered.  Patient states she is going to discuss with her husband and call the office with her decision.

## 2016-01-11 ENCOUNTER — Ambulatory Visit (HOSPITAL_BASED_OUTPATIENT_CLINIC_OR_DEPARTMENT_OTHER): Payer: BC Managed Care – PPO | Admitting: Hematology & Oncology

## 2016-01-11 ENCOUNTER — Other Ambulatory Visit (HOSPITAL_BASED_OUTPATIENT_CLINIC_OR_DEPARTMENT_OTHER): Payer: BC Managed Care – PPO

## 2016-01-11 ENCOUNTER — Ambulatory Visit (HOSPITAL_BASED_OUTPATIENT_CLINIC_OR_DEPARTMENT_OTHER): Payer: BC Managed Care – PPO

## 2016-01-11 VITALS — BP 135/75 | HR 84 | Temp 98.5°F | Resp 16 | Wt 187.0 lb

## 2016-01-11 DIAGNOSIS — C541 Malignant neoplasm of endometrium: Secondary | ICD-10-CM | POA: Diagnosis not present

## 2016-01-11 DIAGNOSIS — C9001 Multiple myeloma in remission: Secondary | ICD-10-CM

## 2016-01-11 DIAGNOSIS — Z5112 Encounter for antineoplastic immunotherapy: Secondary | ICD-10-CM

## 2016-01-11 DIAGNOSIS — C9002 Multiple myeloma in relapse: Secondary | ICD-10-CM

## 2016-01-11 DIAGNOSIS — C9000 Multiple myeloma not having achieved remission: Secondary | ICD-10-CM

## 2016-01-11 LAB — CMP (CANCER CENTER ONLY)
ALT(SGPT): 18 U/L (ref 10–47)
AST: 22 U/L (ref 11–38)
Albumin: 3.4 g/dL (ref 3.3–5.5)
Alkaline Phosphatase: 42 U/L (ref 26–84)
BUN, Bld: 17 mg/dL (ref 7–22)
CO2: 24 mEq/L (ref 18–33)
Calcium: 9.6 mg/dL (ref 8.0–10.3)
Chloride: 104 mEq/L (ref 98–108)
Creat: 0.8 mg/dl (ref 0.6–1.2)
Glucose, Bld: 107 mg/dL (ref 73–118)
Potassium: 3.6 mEq/L (ref 3.3–4.7)
Sodium: 139 mEq/L (ref 128–145)
Total Bilirubin: 0.5 mg/dl (ref 0.20–1.60)
Total Protein: 7.2 g/dL (ref 6.4–8.1)

## 2016-01-11 LAB — CBC WITH DIFFERENTIAL (CANCER CENTER ONLY)
BASO#: 0.1 10*3/uL (ref 0.0–0.2)
BASO%: 1.8 % (ref 0.0–2.0)
EOS%: 3.3 % (ref 0.0–7.0)
Eosinophils Absolute: 0.2 10*3/uL (ref 0.0–0.5)
HCT: 32.4 % — ABNORMAL LOW (ref 34.8–46.6)
HGB: 10.8 g/dL — ABNORMAL LOW (ref 11.6–15.9)
LYMPH#: 1.5 10*3/uL (ref 0.9–3.3)
LYMPH%: 28.8 % (ref 14.0–48.0)
MCH: 27.3 pg (ref 26.0–34.0)
MCHC: 33.3 g/dL (ref 32.0–36.0)
MCV: 82 fL (ref 81–101)
MONO#: 0.6 10*3/uL (ref 0.1–0.9)
MONO%: 12.3 % (ref 0.0–13.0)
NEUT#: 2.8 10*3/uL (ref 1.5–6.5)
NEUT%: 53.8 % (ref 39.6–80.0)
Platelets: 251 10*3/uL (ref 145–400)
RBC: 3.96 10*6/uL (ref 3.70–5.32)
RDW: 17.8 % — ABNORMAL HIGH (ref 11.1–15.7)
WBC: 5.1 10*3/uL (ref 3.9–10.0)

## 2016-01-11 LAB — LACTATE DEHYDROGENASE: LDH: 182 U/L (ref 125–245)

## 2016-01-11 MED ORDER — ONDANSETRON HCL 8 MG PO TABS
8.0000 mg | ORAL_TABLET | Freq: Once | ORAL | Status: AC
  Administered 2016-01-11: 8 mg via ORAL

## 2016-01-11 MED ORDER — BORTEZOMIB CHEMO SQ INJECTION 3.5 MG (2.5MG/ML)
1.2000 mg/m2 | Freq: Once | INTRAMUSCULAR | Status: AC
  Administered 2016-01-11: 2.5 mg via SUBCUTANEOUS
  Filled 2016-01-11: qty 2.5

## 2016-01-11 MED ORDER — ONDANSETRON HCL 8 MG PO TABS
ORAL_TABLET | ORAL | Status: AC
  Filled 2016-01-11: qty 1

## 2016-01-11 NOTE — Patient Instructions (Signed)
Bortezomib injection What is this medicine? BORTEZOMIB (bor TEZ oh mib) is a medicine that targets proteins in cancer cells and stops the cancer cells from growing. It is used to treat multiple myeloma and mantle-cell lymphoma. This medicine may be used for other purposes; ask your health care provider or pharmacist if you have questions. What should I tell my health care provider before I take this medicine? They need to know if you have any of these conditions: -diabetes -heart disease -irregular heartbeat -liver disease -on hemodialysis -low blood counts, like low white blood cells, platelets, or hemoglobin -peripheral neuropathy -taking medicine for blood pressure -an unusual or allergic reaction to bortezomib, mannitol, boron, other medicines, foods, dyes, or preservatives -pregnant or trying to get pregnant -breast-feeding How should I use this medicine? This medicine is for injection into a vein or for injection under the skin. It is given by a health care professional in a hospital or clinic setting. Talk to your pediatrician regarding the use of this medicine in children. Special care may be needed. Overdosage: If you think you have taken too much of this medicine contact a poison control center or emergency room at once. NOTE: This medicine is only for you. Do not share this medicine with others. What if I miss a dose? It is important not to miss your dose. Call your doctor or health care professional if you are unable to keep an appointment. What may interact with this medicine? This medicine may interact with the following medications: -ketoconazole -rifampin -ritonavir -St. John's Wort This list may not describe all possible interactions. Give your health care provider a list of all the medicines, herbs, non-prescription drugs, or dietary supplements you use. Also tell them if you smoke, drink alcohol, or use illegal drugs. Some items may interact with your medicine. What  should I watch for while using this medicine? Visit your doctor for checks on your progress. This drug may make you feel generally unwell. This is not uncommon, as chemotherapy can affect healthy cells as well as cancer cells. Report any side effects. Continue your course of treatment even though you feel ill unless your doctor tells you to stop. You may get drowsy or dizzy. Do not drive, use machinery, or do anything that needs mental alertness until you know how this medicine affects you. Do not stand or sit up quickly, especially if you are an older patient. This reduces the risk of dizzy or fainting spells. In some cases, you may be given additional medicines to help with side effects. Follow all directions for their use. Call your doctor or health care professional for advice if you get a fever, chills or sore throat, or other symptoms of a cold or flu. Do not treat yourself. This drug decreases your body's ability to fight infections. Try to avoid being around people who are sick. This medicine may increase your risk to bruise or bleed. Call your doctor or health care professional if you notice any unusual bleeding. You may need blood work done while you are taking this medicine. In some patients, this medicine may cause a serious brain infection that may cause death. If you have any problems seeing, thinking, speaking, walking, or standing, tell your doctor right away. If you cannot reach your doctor, urgently seek other source of medical care. Do not become pregnant while taking this medicine. Women should inform their doctor if they wish to become pregnant or think they might be pregnant. There is a potential for serious  side effects to an unborn child. Talk to your health care professional or pharmacist for more information. Do not breast-feed an infant while taking this medicine. Check with your doctor or health care professional if you get an attack of severe diarrhea, nausea and vomiting, or if  you sweat a lot. The loss of too much body fluid can make it dangerous for you to take this medicine. What side effects may I notice from receiving this medicine? Side effects that you should report to your doctor or health care professional as soon as possible: -allergic reactions like skin rash, itching or hives, swelling of the face, lips, or tongue -breathing problems -changes in hearing -changes in vision -fast, irregular heartbeat -feeling faint or lightheaded, falls -pain, tingling, numbness in the hands or feet -right upper belly pain -seizures -swelling of the ankles, feet, hands -unusual bleeding or bruising -unusually weak or tired -vomiting -yellowing of the eyes or skin Side effects that usually do not require medical attention (report to your doctor or health care professional if they continue or are bothersome): -changes in emotions or moods -constipation -diarrhea -loss of appetite -headache -irritation at site where injected -nausea This list may not describe all possible side effects. Call your doctor for medical advice about side effects. You may report side effects to FDA at 1-800-FDA-1088. Where should I keep my medicine? This drug is given in a hospital or clinic and will not be stored at home. NOTE: This sheet is a summary. It may not cover all possible information. If you have questions about this medicine, talk to your doctor, pharmacist, or health care provider.    2016, Elsevier/Gold Standard. (2014-03-17 14:47:04)

## 2016-01-11 NOTE — Progress Notes (Signed)
Hematology and Oncology Follow Up Visit  SHANECIA HOGANSON [id number] February 01, 1950 65 y.o. 01/11/2016   Principle Diagnosis:   Recurrent lambda light chain myeloma  History of recurrent endometrial carcinoma  Current Therapy:    Status post second autologous stem cell transplant on 07/24/2014  Maintenance therapy with Pomalidomide/every 2 week Velcade  Aredia 60 mg IV Q. 3 month - next dose on 05/2016 due to tooth extraction  Radiation therapy for endometrial recurrence - completed 04/20/2015  Femara 2.5mg  po q day     Interim History:  Ms.  Guin is back for followup. She is doing pretty well. She had a very busy Thanksgiving.  Interestingly, she may have some cervical dysplasia. I think she is going to get the HPV vaccine.  Her myeloma has been doing quite well..  her last serum light chain level was 20 mg/L. This is holding pretty steady.   She has a little bit of a cold. I don't that she needs any antibiotics.  She's had no bleeding. She's had no cough. She's had no nausea or vomiting.  Overall, her performance status is ECOG 1.  Medications: Allergies:  Allergies  Allergen Reactions  . Codeine Nausea Only    Past Medical History, Surgical history, Social history, and Family History were reviewed and updated.  Review of Systems: As above  Physical Exam:  weight is 187 lb (84.8 kg). Her oral temperature is 98.5 F (36.9 C). Her blood pressure is 135/75 and her pulse is 84. Her respiration is 16.   Well-developed and well-nourished [name]. Head and neck exam shows no ocular or oral lesion. There are no palpable cervical or supraclavicular lymph nodes. Lungs are clear. Cardiac exam regular rate and rhythm with no murmurs rubs or bruits. Abdomen is soft. She has good bowel sounds. There is no fluid wave. There is no palpable liver or spleen tip. Extremities shows no clubbing, cyanosis or edema. Neurological exam shows no focal neurological deficits.  Skin exam no rashes, ecchymosis or petechia.  Lab Results  Component Value Date   WBC 5.1 01/11/2016   HGB 10.8 (L) 01/11/2016   HCT 32.4 (L) 01/11/2016   MCV 82 01/11/2016   PLT 251 01/11/2016     Chemistry      Component Value Date/Time   NA 139 01/11/2016 1145   NA 141 03/17/2015 1018   K 3.6 01/11/2016 1145   K 3.2 (L) 03/17/2015 1018   CL 104 01/11/2016 1145   CO2 24 01/11/2016 1145   CO2 25 03/17/2015 1018   BUN 17 01/11/2016 1145   BUN 21.3 03/17/2015 1018   CREATININE 0.8 01/11/2016 1145   CREATININE 1.0 03/17/2015 1018      Component Value Date/Time   CALCIUM 9.6 01/11/2016 1145   CALCIUM 9.6 03/17/2015 1018   ALKPHOS 42 01/11/2016 1145   ALKPHOS 47 03/17/2015 1018   AST 22 01/11/2016 1145   AST 17 03/17/2015 1018   ALT 18 01/11/2016 1145   ALT 14 03/17/2015 1018   BILITOT 0.50 01/11/2016 1145   BILITOT 0.54 03/17/2015 1018         Impression and Plan: Ms. Vanacker is 65 year old female. She has recurrent lambda light chain myeloma. She also had recurrent endometrial cancer. The recurrence was localized. She is being followed by OB/GYN and radiation oncology for this. She completed radiation therapy back in March. She had 50.4 rad for this.   We will go ahead with Velcade today. We do this every 2 weeks.  Again, we will hold on the Aredia probably until next May. I did talk to her about this. We certainly can have the oral surgeon call us if there are any questions.   We will continue the Velcade every 2 weeks.  I think we probably get her back in 6 weeks now.  [name], MD 12/12/201712:50 PM

## 2016-01-12 LAB — KAPPA/LAMBDA LIGHT CHAINS
Ig Kappa Free Light Chain: 16.4 mg/L (ref 3.3–19.4)
Ig Lambda Free Light Chain: 19.7 mg/L (ref 5.7–26.3)
Kappa/Lambda FluidC Ratio: 0.83 (ref 0.26–1.65)

## 2016-01-12 LAB — IGG, IGA, IGM
IgA, Qn, Serum: 178 mg/dL (ref 87–352)
IgG, Qn, Serum: 1323 mg/dL (ref 700–1600)
IgM, Qn, Serum: 32 mg/dL (ref 26–217)

## 2016-01-14 ENCOUNTER — Telehealth: Payer: Self-pay | Admitting: Gynecologic Oncology

## 2016-01-14 LAB — PROTEIN ELECTROPHORESIS, SERUM, WITH REFLEX
A/G Ratio: 1.1 (ref 0.7–1.7)
Albumin: 3.8 g/dL (ref 2.9–4.4)
Alpha 1: 0.2 g/dL (ref 0.0–0.4)
Alpha 2: 0.8 g/dL (ref 0.4–1.0)
Beta: 1.3 g/dL (ref 0.7–1.3)
Gamma Globulin: 1.1 g/dL (ref 0.4–1.8)
Globulin, Total: 3.4 g/dL (ref 2.2–3.9)
Total Protein: 7.2 g/dL (ref 6.0–8.5)

## 2016-01-14 NOTE — Telephone Encounter (Signed)
Patient called stating she would like to proceed with vaginal laser procedure.  We discussed dates and she would like to schedule with Dr. Skeet Latch for Jan 23 at the [place].  All questions answered.  Advised to call the office for any questions or concerns.

## 2016-01-25 ENCOUNTER — Other Ambulatory Visit: Payer: Self-pay | Admitting: *Deleted

## 2016-01-25 ENCOUNTER — Other Ambulatory Visit: Payer: Self-pay | Admitting: Hematology & Oncology

## 2016-01-25 MED ORDER — POMALIDOMIDE 1 MG PO CAPS: ORAL_CAPSULE | ORAL | 4 refills | Status: DC

## 2016-01-28 ENCOUNTER — Other Ambulatory Visit: Payer: Self-pay | Admitting: *Deleted

## 2016-01-28 ENCOUNTER — Other Ambulatory Visit (HOSPITAL_BASED_OUTPATIENT_CLINIC_OR_DEPARTMENT_OTHER): Payer: BC Managed Care – PPO

## 2016-01-28 ENCOUNTER — Ambulatory Visit (HOSPITAL_BASED_OUTPATIENT_CLINIC_OR_DEPARTMENT_OTHER): Payer: BC Managed Care – PPO

## 2016-01-28 VITALS — BP 116/59 | HR 66 | Temp 98.3°F | Resp 20

## 2016-01-28 DIAGNOSIS — Z5112 Encounter for antineoplastic immunotherapy: Secondary | ICD-10-CM

## 2016-01-28 DIAGNOSIS — C541 Malignant neoplasm of endometrium: Secondary | ICD-10-CM | POA: Diagnosis not present

## 2016-01-28 DIAGNOSIS — I10 Essential (primary) hypertension: Secondary | ICD-10-CM

## 2016-01-28 DIAGNOSIS — C9001 Multiple myeloma in remission: Secondary | ICD-10-CM

## 2016-01-28 DIAGNOSIS — C9000 Multiple myeloma not having achieved remission: Secondary | ICD-10-CM | POA: Diagnosis not present

## 2016-01-28 LAB — CBC WITH DIFFERENTIAL (CANCER CENTER ONLY)
BASO#: 0 10*3/uL (ref 0.0–0.2)
BASO%: 0.5 % (ref 0.0–2.0)
EOS%: 0.5 % (ref 0.0–7.0)
Eosinophils Absolute: 0 10*3/uL (ref 0.0–0.5)
HCT: 32.3 % — ABNORMAL LOW (ref 34.8–46.6)
HGB: 10.8 g/dL — ABNORMAL LOW (ref 11.6–15.9)
LYMPH#: 0.9 10*3/uL (ref 0.9–3.3)
LYMPH%: 20.8 % (ref 14.0–48.0)
MCH: 27.3 pg (ref 26.0–34.0)
MCHC: 33.4 g/dL (ref 32.0–36.0)
MCV: 82 fL (ref 81–101)
MONO#: 0.1 10*3/uL (ref 0.1–0.9)
MONO%: 2.9 % (ref 0.0–13.0)
NEUT#: 3.1 10*3/uL (ref 1.5–6.5)
NEUT%: 75.3 % (ref 39.6–80.0)
Platelets: 200 10*3/uL (ref 145–400)
RBC: 3.96 10*6/uL (ref 3.70–5.32)
RDW: 17.2 % — ABNORMAL HIGH (ref 11.1–15.7)
WBC: 4.1 10*3/uL (ref 3.9–10.0)

## 2016-01-28 LAB — CMP (CANCER CENTER ONLY)
ALT(SGPT): 21 U/L (ref 10–47)
AST: 24 U/L (ref 11–38)
Albumin: 3.6 g/dL (ref 3.3–5.5)
Alkaline Phosphatase: 45 U/L (ref 26–84)
BUN, Bld: 15 mg/dL (ref 7–22)
CO2: 25 mEq/L (ref 18–33)
Calcium: 9.2 mg/dL (ref 8.0–10.3)
Chloride: 105 mEq/L (ref 98–108)
Creat: 1.3 mg/dl — ABNORMAL HIGH (ref 0.6–1.2)
Glucose, Bld: 120 mg/dL — ABNORMAL HIGH (ref 73–118)
Potassium: 3.6 mEq/L (ref 3.3–4.7)
Sodium: 139 mEq/L (ref 128–145)
Total Bilirubin: 0.6 mg/dl (ref 0.20–1.60)
Total Protein: 7.4 g/dL (ref 6.4–8.1)

## 2016-01-28 MED ORDER — TRIAMTERENE-HCTZ 37.5-25 MG PO TABS: 1.0000 | ORAL_TABLET | Freq: Every day | ORAL | 6 refills | Status: DC

## 2016-01-28 MED ORDER — ONDANSETRON HCL 8 MG PO TABS
8.0000 mg | ORAL_TABLET | Freq: Once | ORAL | Status: AC
  Administered 2016-01-28: 8 mg via ORAL

## 2016-01-28 MED ORDER — BORTEZOMIB CHEMO SQ INJECTION 3.5 MG (2.5MG/ML)
1.2000 mg/m2 | Freq: Once | INTRAMUSCULAR | Status: AC
  Administered 2016-01-28: 2.5 mg via SUBCUTANEOUS
  Filled 2016-01-28: qty 2.5

## 2016-01-28 MED ORDER — ONDANSETRON HCL 8 MG PO TABS
ORAL_TABLET | ORAL | Status: AC
  Filled 2016-01-28: qty 1

## 2016-01-28 NOTE — Patient Instructions (Signed)
Bortezomib injection What is this medicine? BORTEZOMIB (bor TEZ oh mib) is a medicine that targets proteins in cancer cells and stops the cancer cells from growing. It is used to treat multiple myeloma and mantle-cell lymphoma. This medicine may be used for other purposes; ask your health care provider or pharmacist if you have questions. What should I tell my health care provider before I take this medicine? They need to know if you have any of these conditions: -diabetes -heart disease -irregular heartbeat -liver disease -on hemodialysis -low blood counts, like low white blood cells, platelets, or hemoglobin -peripheral neuropathy -taking medicine for blood pressure -an unusual or allergic reaction to bortezomib, mannitol, boron, other medicines, foods, dyes, or preservatives -pregnant or trying to get pregnant -breast-feeding How should I use this medicine? This medicine is for injection into a vein or for injection under the skin. It is given by a health care professional in a hospital or clinic setting. Talk to your pediatrician regarding the use of this medicine in children. Special care may be needed. Overdosage: If you think you have taken too much of this medicine contact a poison control center or emergency room at once. NOTE: This medicine is only for you. Do not share this medicine with others. What if I miss a dose? It is important not to miss your dose. Call your doctor or health care professional if you are unable to keep an appointment. What may interact with this medicine? This medicine may interact with the following medications: -ketoconazole -rifampin -ritonavir -St. John's Wort This list may not describe all possible interactions. Give your health care provider a list of all the medicines, herbs, non-prescription drugs, or dietary supplements you use. Also tell them if you smoke, drink alcohol, or use illegal drugs. Some items may interact with your medicine. What  should I watch for while using this medicine? Visit your doctor for checks on your progress. This drug may make you feel generally unwell. This is not uncommon, as chemotherapy can affect healthy cells as well as cancer cells. Report any side effects. Continue your course of treatment even though you feel ill unless your doctor tells you to stop. You may get drowsy or dizzy. Do not drive, use machinery, or do anything that needs mental alertness until you know how this medicine affects you. Do not stand or sit up quickly, especially if you are an older patient. This reduces the risk of dizzy or fainting spells. In some cases, you may be given additional medicines to help with side effects. Follow all directions for their use. Call your doctor or health care professional for advice if you get a fever, chills or sore throat, or other symptoms of a cold or flu. Do not treat yourself. This drug decreases your body's ability to fight infections. Try to avoid being around people who are sick. This medicine may increase your risk to bruise or bleed. Call your doctor or health care professional if you notice any unusual bleeding. You may need blood work done while you are taking this medicine. In some patients, this medicine may cause a serious brain infection that may cause death. If you have any problems seeing, thinking, speaking, walking, or standing, tell your doctor right away. If you cannot reach your doctor, urgently seek other source of medical care. Do not become pregnant while taking this medicine. Women should inform their doctor if they wish to become pregnant or think they might be pregnant. There is a potential for serious  side effects to an unborn child. Talk to your health care professional or pharmacist for more information. Do not breast-feed an infant while taking this medicine. Check with your doctor or health care professional if you get an attack of severe diarrhea, nausea and vomiting, or if  you sweat a lot. The loss of too much body fluid can make it dangerous for you to take this medicine. What side effects may I notice from receiving this medicine? Side effects that you should report to your doctor or health care professional as soon as possible: -allergic reactions like skin rash, itching or hives, swelling of the face, lips, or tongue -breathing problems -changes in hearing -changes in vision -fast, irregular heartbeat -feeling faint or lightheaded, falls -pain, tingling, numbness in the hands or feet -right upper belly pain -seizures -swelling of the ankles, feet, hands -unusual bleeding or bruising -unusually weak or tired -vomiting -yellowing of the eyes or skin Side effects that usually do not require medical attention (report to your doctor or health care professional if they continue or are bothersome): -changes in emotions or moods -constipation -diarrhea -loss of appetite -headache -irritation at site where injected -nausea This list may not describe all possible side effects. Call your doctor for medical advice about side effects. You may report side effects to FDA at 1-800-FDA-1088. Where should I keep my medicine? This drug is given in a hospital or clinic and will not be stored at home. NOTE: This sheet is a summary. It may not cover all possible information. If you have questions about this medicine, talk to your doctor, pharmacist, or health care provider.    2016, Elsevier/Gold Standard. (2014-03-17 14:47:04)

## 2016-01-29 LAB — IGG, IGA, IGM
IgA, Qn, Serum: 187 mg/dL (ref 87–352)
IgG, Qn, Serum: 1226 mg/dL (ref 700–1600)
IgM, Qn, Serum: 34 mg/dL (ref 26–217)

## 2016-02-01 ENCOUNTER — Other Ambulatory Visit: Payer: Self-pay | Admitting: *Deleted

## 2016-02-01 DIAGNOSIS — C9001 Multiple myeloma in remission: Secondary | ICD-10-CM

## 2016-02-01 DIAGNOSIS — C9000 Multiple myeloma not having achieved remission: Secondary | ICD-10-CM

## 2016-02-01 DIAGNOSIS — I10 Essential (primary) hypertension: Secondary | ICD-10-CM

## 2016-02-01 LAB — KAPPA/LAMBDA LIGHT CHAINS
Ig Kappa Free Light Chain: 22.6 mg/L — ABNORMAL HIGH (ref 3.3–19.4)
Ig Lambda Free Light Chain: 23.6 mg/L (ref 5.7–26.3)
Kappa/Lambda FluidC Ratio: 0.96 (ref 0.26–1.65)

## 2016-02-01 MED ORDER — TRIAMTERENE-HCTZ 37.5-25 MG PO TABS: 1.0000 | ORAL_TABLET | Freq: Every day | ORAL | 6 refills | Status: DC

## 2016-02-01 MED ORDER — POTASSIUM CHLORIDE CRYS ER 20 MEQ PO TBCR: EXTENDED_RELEASE_TABLET | ORAL | 0 refills | Status: DC

## 2016-02-01 NOTE — Telephone Encounter (Signed)
Patient requesting refills on k-dur and maxide, e-scribed to patient's pharmacy

## 2016-02-02 LAB — PROTEIN ELECTROPHORESIS, SERUM, WITH REFLEX
A/G Ratio: 1.1 (ref 0.7–1.7)
Albumin: 3.6 g/dL (ref 2.9–4.4)
Alpha 1: 0.2 g/dL (ref 0.0–0.4)
Alpha 2: 0.8 g/dL (ref 0.4–1.0)
Beta: 1.3 g/dL (ref 0.7–1.3)
Gamma Globulin: 1.2 g/dL (ref 0.4–1.8)
Globulin, Total: 3.4 g/dL (ref 2.2–3.9)
Interpretation(See Below): 0
M-Spike, %: 0.2 g/dL — ABNORMAL HIGH
Total Protein: 7 g/dL (ref 6.0–8.5)

## 2016-02-10 ENCOUNTER — Other Ambulatory Visit: Payer: Self-pay | Admitting: *Deleted

## 2016-02-10 DIAGNOSIS — C9001 Multiple myeloma in remission: Secondary | ICD-10-CM

## 2016-02-11 ENCOUNTER — Ambulatory Visit (HOSPITAL_BASED_OUTPATIENT_CLINIC_OR_DEPARTMENT_OTHER): Payer: BC Managed Care – PPO

## 2016-02-11 ENCOUNTER — Other Ambulatory Visit (HOSPITAL_BASED_OUTPATIENT_CLINIC_OR_DEPARTMENT_OTHER): Payer: BC Managed Care – PPO

## 2016-02-11 VITALS — BP 142/73 | HR 72 | Temp 98.4°F | Resp 18

## 2016-02-11 DIAGNOSIS — Z5112 Encounter for antineoplastic immunotherapy: Secondary | ICD-10-CM

## 2016-02-11 DIAGNOSIS — C9000 Multiple myeloma not having achieved remission: Secondary | ICD-10-CM

## 2016-02-11 DIAGNOSIS — C9001 Multiple myeloma in remission: Secondary | ICD-10-CM | POA: Diagnosis not present

## 2016-02-11 LAB — CBC WITH DIFFERENTIAL (CANCER CENTER ONLY)
BASO#: 0.1 10*3/uL (ref 0.0–0.2)
BASO%: 1.4 % (ref 0.0–2.0)
EOS%: 1.9 % (ref 0.0–7.0)
Eosinophils Absolute: 0.1 10*3/uL (ref 0.0–0.5)
HCT: 33.6 % — ABNORMAL LOW (ref 34.8–46.6)
HGB: 11.3 g/dL — ABNORMAL LOW (ref 11.6–15.9)
LYMPH#: 1.5 10*3/uL (ref 0.9–3.3)
LYMPH%: 35.1 % (ref 14.0–48.0)
MCH: 27.8 pg (ref 26.0–34.0)
MCHC: 33.6 g/dL (ref 32.0–36.0)
MCV: 83 fL (ref 81–101)
MONO#: 0.5 10*3/uL (ref 0.1–0.9)
MONO%: 12.7 % (ref 0.0–13.0)
NEUT#: 2.1 10*3/uL (ref 1.5–6.5)
NEUT%: 48.9 % (ref 39.6–80.0)
Platelets: 227 10*3/uL (ref 145–400)
RBC: 4.07 10*6/uL (ref 3.70–5.32)
RDW: 18 % — ABNORMAL HIGH (ref 11.1–15.7)
WBC: 4.3 10*3/uL (ref 3.9–10.0)

## 2016-02-11 LAB — CMP (CANCER CENTER ONLY)
ALT(SGPT): 28 U/L (ref 10–47)
AST: 21 U/L (ref 11–38)
Albumin: 3.8 g/dL (ref 3.3–5.5)
Alkaline Phosphatase: 38 U/L (ref 26–84)
BUN, Bld: 13 mg/dL (ref 7–22)
CO2: 26 mEq/L (ref 18–33)
Calcium: 9.5 mg/dL (ref 8.0–10.3)
Chloride: 104 mEq/L (ref 98–108)
Creat: 1 mg/dl (ref 0.6–1.2)
Glucose, Bld: 84 mg/dL (ref 73–118)
Potassium: 3.7 mEq/L (ref 3.3–4.7)
Sodium: 139 mEq/L (ref 128–145)
Total Bilirubin: 0.6 mg/dl (ref 0.20–1.60)
Total Protein: 7.2 g/dL (ref 6.4–8.1)

## 2016-02-11 MED ORDER — ONDANSETRON HCL 8 MG PO TABS
ORAL_TABLET | ORAL | Status: AC
  Filled 2016-02-11: qty 1

## 2016-02-11 MED ORDER — ONDANSETRON HCL 8 MG PO TABS
8.0000 mg | ORAL_TABLET | Freq: Once | ORAL | Status: AC
  Administered 2016-02-11: 8 mg via ORAL

## 2016-02-11 MED ORDER — BORTEZOMIB CHEMO SQ INJECTION 3.5 MG (2.5MG/ML)
1.2000 mg/m2 | Freq: Once | INTRAMUSCULAR | Status: AC
  Administered 2016-02-11: 2.5 mg via SUBCUTANEOUS
  Filled 2016-02-11: qty 2.5

## 2016-02-11 NOTE — Patient Instructions (Signed)
Bortezomib injection What is this medicine? BORTEZOMIB (bor TEZ oh mib) is a medicine that targets proteins in cancer cells and stops the cancer cells from growing. It is used to treat multiple myeloma and mantle-cell lymphoma. This medicine may be used for other purposes; ask your health care provider or pharmacist if you have questions. What should I tell my health care provider before I take this medicine? They need to know if you have any of these conditions: -diabetes -heart disease -irregular heartbeat -liver disease -on hemodialysis -low blood counts, like low white blood cells, platelets, or hemoglobin -peripheral neuropathy -taking medicine for blood pressure -an unusual or allergic reaction to bortezomib, mannitol, boron, other medicines, foods, dyes, or preservatives -pregnant or trying to get pregnant -breast-feeding How should I use this medicine? This medicine is for injection into a vein or for injection under the skin. It is given by a health care professional in a hospital or clinic setting. Talk to your pediatrician regarding the use of this medicine in children. Special care may be needed. Overdosage: If you think you have taken too much of this medicine contact a poison control center or emergency room at once. NOTE: This medicine is only for you. Do not share this medicine with others. What if I miss a dose? It is important not to miss your dose. Call your doctor or health care professional if you are unable to keep an appointment. What may interact with this medicine? This medicine may interact with the following medications: -ketoconazole -rifampin -ritonavir -St. John's Wort This list may not describe all possible interactions. Give your health care provider a list of all the medicines, herbs, non-prescription drugs, or dietary supplements you use. Also tell them if you smoke, drink alcohol, or use illegal drugs. Some items may interact with your medicine. What  should I watch for while using this medicine? Visit your doctor for checks on your progress. This drug may make you feel generally unwell. This is not uncommon, as chemotherapy can affect healthy cells as well as cancer cells. Report any side effects. Continue your course of treatment even though you feel ill unless your doctor tells you to stop. You may get drowsy or dizzy. Do not drive, use machinery, or do anything that needs mental alertness until you know how this medicine affects you. Do not stand or sit up quickly, especially if you are an older patient. This reduces the risk of dizzy or fainting spells. In some cases, you may be given additional medicines to help with side effects. Follow all directions for their use. Call your doctor or health care professional for advice if you get a fever, chills or sore throat, or other symptoms of a cold or flu. Do not treat yourself. This drug decreases your body's ability to fight infections. Try to avoid being around people who are sick. This medicine may increase your risk to bruise or bleed. Call your doctor or health care professional if you notice any unusual bleeding. You may need blood work done while you are taking this medicine. In some patients, this medicine may cause a serious brain infection that may cause death. If you have any problems seeing, thinking, speaking, walking, or standing, tell your doctor right away. If you cannot reach your doctor, urgently seek other source of medical care. Do not become pregnant while taking this medicine. Women should inform their doctor if they wish to become pregnant or think they might be pregnant. There is a potential for serious  side effects to an unborn child. Talk to your health care professional or pharmacist for more information. Do not breast-feed an infant while taking this medicine. Check with your doctor or health care professional if you get an attack of severe diarrhea, nausea and vomiting, or if  you sweat a lot. The loss of too much body fluid can make it dangerous for you to take this medicine. What side effects may I notice from receiving this medicine? Side effects that you should report to your doctor or health care professional as soon as possible: -allergic reactions like skin rash, itching or hives, swelling of the face, lips, or tongue -breathing problems -changes in hearing -changes in vision -fast, irregular heartbeat -feeling faint or lightheaded, falls -pain, tingling, numbness in the hands or feet -right upper belly pain -seizures -swelling of the ankles, feet, hands -unusual bleeding or bruising -unusually weak or tired -vomiting -yellowing of the eyes or skin Side effects that usually do not require medical attention (report to your doctor or health care professional if they continue or are bothersome): -changes in emotions or moods -constipation -diarrhea -loss of appetite -headache -irritation at site where injected -nausea This list may not describe all possible side effects. Call your doctor for medical advice about side effects. You may report side effects to FDA at 1-800-FDA-1088. Where should I keep my medicine? This drug is given in a hospital or clinic and will not be stored at home. NOTE: This sheet is a summary. It may not cover all possible information. If you have questions about this medicine, talk to your doctor, pharmacist, or health care provider.    2016, Elsevier/Gold Standard. (2014-03-17 14:47:04)

## 2016-02-18 ENCOUNTER — Encounter (HOSPITAL_BASED_OUTPATIENT_CLINIC_OR_DEPARTMENT_OTHER): Payer: Self-pay | Admitting: *Deleted

## 2016-02-18 NOTE — Progress Notes (Signed)
NPO AFTER MN.  ARRIVE AT 0900. NEEDS EKG.  CURRENT LAB RESULTS IN CHART AND EPIC.  WILL TAKE NORVASC AM DOS W/ SIPS OF WATER.

## 2016-02-21 ENCOUNTER — Other Ambulatory Visit: Payer: Self-pay | Admitting: *Deleted

## 2016-02-21 ENCOUNTER — Other Ambulatory Visit: Payer: Self-pay | Admitting: Hematology & Oncology

## 2016-02-21 MED ORDER — POMALIDOMIDE 1 MG PO CAPS: ORAL_CAPSULE | ORAL | 0 refills | Status: DC

## 2016-02-22 ENCOUNTER — Ambulatory Visit (HOSPITAL_BASED_OUTPATIENT_CLINIC_OR_DEPARTMENT_OTHER): Payer: BC Managed Care – PPO | Admitting: Anesthesiology

## 2016-02-22 ENCOUNTER — Other Ambulatory Visit: Payer: Self-pay

## 2016-02-22 ENCOUNTER — Ambulatory Visit (HOSPITAL_BASED_OUTPATIENT_CLINIC_OR_DEPARTMENT_OTHER)
Admission: RE | Admit: 2016-02-22 | Discharge: 2016-02-22 | Disposition: A | Discharge status: Home / Self Care | Payer: BC Managed Care – PPO | Source: Ambulatory Visit | Attending: Gynecologic Oncology | Admitting: Gynecologic Oncology

## 2016-02-22 ENCOUNTER — Encounter (HOSPITAL_BASED_OUTPATIENT_CLINIC_OR_DEPARTMENT_OTHER)
Admission: RE | Disposition: A | Discharge status: Home / Self Care | Payer: Self-pay | Source: Ambulatory Visit | Attending: Gynecologic Oncology

## 2016-02-22 ENCOUNTER — Encounter (HOSPITAL_BASED_OUTPATIENT_CLINIC_OR_DEPARTMENT_OTHER): Payer: Self-pay | Admitting: *Deleted

## 2016-02-22 DIAGNOSIS — K219 Gastro-esophageal reflux disease without esophagitis: Secondary | ICD-10-CM | POA: Insufficient documentation

## 2016-02-22 DIAGNOSIS — N890 Mild vaginal dysplasia: Secondary | ICD-10-CM | POA: Insufficient documentation

## 2016-02-22 DIAGNOSIS — Z923 Personal history of irradiation: Secondary | ICD-10-CM | POA: Diagnosis not present

## 2016-02-22 DIAGNOSIS — Z79899 Other long term (current) drug therapy: Secondary | ICD-10-CM | POA: Diagnosis not present

## 2016-02-22 DIAGNOSIS — Z90710 Acquired absence of both cervix and uterus: Secondary | ICD-10-CM | POA: Insufficient documentation

## 2016-02-22 DIAGNOSIS — E041 Nontoxic single thyroid nodule: Secondary | ICD-10-CM | POA: Diagnosis not present

## 2016-02-22 DIAGNOSIS — Z7982 Long term (current) use of aspirin: Secondary | ICD-10-CM | POA: Diagnosis not present

## 2016-02-22 DIAGNOSIS — Z79811 Long term (current) use of aromatase inhibitors: Secondary | ICD-10-CM | POA: Insufficient documentation

## 2016-02-22 DIAGNOSIS — Z8542 Personal history of malignant neoplasm of other parts of uterus: Secondary | ICD-10-CM | POA: Diagnosis not present

## 2016-02-22 DIAGNOSIS — Z90722 Acquired absence of ovaries, bilateral: Secondary | ICD-10-CM | POA: Diagnosis not present

## 2016-02-22 DIAGNOSIS — C9000 Multiple myeloma not having achieved remission: Secondary | ICD-10-CM | POA: Insufficient documentation

## 2016-02-22 DIAGNOSIS — C541 Malignant neoplasm of endometrium: Secondary | ICD-10-CM

## 2016-02-22 DIAGNOSIS — I10 Essential (primary) hypertension: Secondary | ICD-10-CM | POA: Diagnosis not present

## 2016-02-22 DIAGNOSIS — Z9889 Other specified postprocedural states: Secondary | ICD-10-CM | POA: Diagnosis not present

## 2016-02-22 HISTORY — DX: Stem cells transplant status: Z94.84

## 2016-02-22 HISTORY — DX: Personal history of other specified conditions: Z87.898

## 2016-02-22 HISTORY — DX: Idiopathic aseptic necrosis of unspecified femur: M87.059

## 2016-02-22 HISTORY — DX: Gastro-esophageal reflux disease without esophagitis: K21.9

## 2016-02-22 HISTORY — DX: Presence of spectacles and contact lenses: Z97.3

## 2016-02-22 HISTORY — PX: CO2 LASER APPLICATION: SHX5778

## 2016-02-22 SURGERY — CO2 LASER APPLICATION
Anesthesia: General | Site: Vagina

## 2016-02-22 MED ORDER — IODINE STRONG (LUGOLS) 5 % PO SOLN
ORAL | Status: DC | PRN
  Administered 2016-02-22: 5 mL

## 2016-02-22 MED ORDER — DEXAMETHASONE SODIUM PHOSPHATE 4 MG/ML IJ SOLN
INTRAMUSCULAR | Status: DC | PRN
  Administered 2016-02-22: 10 mg via INTRAVENOUS

## 2016-02-22 MED ORDER — ONDANSETRON HCL 4 MG/2ML IJ SOLN
INTRAMUSCULAR | Status: AC
  Filled 2016-02-22: qty 2

## 2016-02-22 MED ORDER — KETOROLAC TROMETHAMINE 30 MG/ML IJ SOLN
INTRAMUSCULAR | Status: DC | PRN
  Administered 2016-02-22: 30 mg via INTRAVENOUS

## 2016-02-22 MED ORDER — LACTATED RINGERS IV SOLN
INTRAVENOUS | Status: DC
  Administered 2016-02-22: 10:00:00 via INTRAVENOUS
  Filled 2016-02-22: qty 1000

## 2016-02-22 MED ORDER — DEXAMETHASONE SODIUM PHOSPHATE 10 MG/ML IJ SOLN
INTRAMUSCULAR | Status: AC
  Filled 2016-02-22: qty 1

## 2016-02-22 MED ORDER — PROPOFOL 10 MG/ML IV BOLUS
INTRAVENOUS | Status: DC | PRN
  Administered 2016-02-22: 150 mg via INTRAVENOUS

## 2016-02-22 MED ORDER — ONDANSETRON HCL 4 MG/2ML IJ SOLN
4.0000 mg | Freq: Once | INTRAMUSCULAR | Status: DC | PRN
  Filled 2016-02-22: qty 2

## 2016-02-22 MED ORDER — SODIUM CHLORIDE 0.9 % IR SOLN
Status: DC | PRN
  Administered 2016-02-22: 500 mL

## 2016-02-22 MED ORDER — ONDANSETRON HCL 4 MG/2ML IJ SOLN
INTRAMUSCULAR | Status: DC | PRN
  Administered 2016-02-22: 4 mg via INTRAVENOUS

## 2016-02-22 MED ORDER — HYDROMORPHONE HCL 1 MG/ML IJ SOLN
0.2500 mg | INTRAMUSCULAR | Status: DC | PRN
  Filled 2016-02-22: qty 0.5

## 2016-02-22 MED ORDER — LIDOCAINE 2% (20 MG/ML) 5 ML SYRINGE
INTRAMUSCULAR | Status: AC
  Filled 2016-02-22: qty 5

## 2016-02-22 MED ORDER — TRAMADOL HCL 50 MG PO TABS: 50.0000 mg | ORAL_TABLET | Freq: Four times a day (QID) | ORAL | 0 refills | Status: AC | PRN

## 2016-02-22 MED ORDER — FENTANYL CITRATE (PF) 100 MCG/2ML IJ SOLN
INTRAMUSCULAR | Status: AC
  Filled 2016-02-22: qty 2

## 2016-02-22 MED ORDER — MIDAZOLAM HCL 5 MG/5ML IJ SOLN
INTRAMUSCULAR | Status: DC | PRN
  Administered 2016-02-22: 2 mg via INTRAVENOUS

## 2016-02-22 MED ORDER — LIDOCAINE 2% (20 MG/ML) 5 ML SYRINGE
INTRAMUSCULAR | Status: DC | PRN
  Administered 2016-02-22: 100 mg via INTRAVENOUS

## 2016-02-22 MED ORDER — SILVER SULFADIAZINE 1 % EX CREA
TOPICAL_CREAM | CUTANEOUS | Status: DC | PRN
  Administered 2016-02-22: 1 via TOPICAL

## 2016-02-22 MED ORDER — FENTANYL CITRATE (PF) 100 MCG/2ML IJ SOLN
INTRAMUSCULAR | Status: DC | PRN
  Administered 2016-02-22 (×2): 50 ug via INTRAVENOUS

## 2016-02-22 MED ORDER — MIDAZOLAM HCL 2 MG/2ML IJ SOLN
INTRAMUSCULAR | Status: AC
  Filled 2016-02-22: qty 2

## 2016-02-22 MED ORDER — MEPERIDINE HCL 25 MG/ML IJ SOLN
6.2500 mg | INTRAMUSCULAR | Status: DC | PRN
  Filled 2016-02-22: qty 1

## 2016-02-22 MED ORDER — PROPOFOL 10 MG/ML IV BOLUS
INTRAVENOUS | Status: AC
  Filled 2016-02-22: qty 40

## 2016-02-22 SURGICAL SUPPLY — 52 items
APPLICATOR COTTON TIP 6IN STRL (MISCELLANEOUS) ×4 IMPLANT
BAG DECANTER FOR FLEXI CONT (MISCELLANEOUS) ×1 IMPLANT
BLADE SURG 11 STRL SS (BLADE) IMPLANT
BLADE SURG 15 STRL LF DISP TIS (BLADE) IMPLANT
BLADE SURG 15 STRL SS (BLADE)
CANISTER SUCTION 1200CC (MISCELLANEOUS) IMPLANT
CANISTER SUCTION 2500CC (MISCELLANEOUS) IMPLANT
CATH ROBINSON RED A/P 16FR (CATHETERS) ×1 IMPLANT
DEPRESSOR TONGUE BLADE STERILE (MISCELLANEOUS) ×4 IMPLANT
DRAPE LG THREE QUARTER DISP (DRAPES) ×1 IMPLANT
DRAPE UNDERBUTTOCKS STRL (DRAPE) ×1 IMPLANT
DRSG TELFA 3X8 NADH (GAUZE/BANDAGES/DRESSINGS) ×2 IMPLANT
ELECT BALL LEEP 3MM BLK (ELECTRODE) IMPLANT
ELECT BALL LEEP 5MM RED (ELECTRODE) IMPLANT
ELECT NDL TIP 2.8 STRL (NEEDLE) IMPLANT
ELECT NEEDLE TIP 2.8 STRL (NEEDLE) IMPLANT
ELECT REM PT RETURN 9FT ADLT (ELECTROSURGICAL)
ELECTRODE REM PT RTRN 9FT ADLT (ELECTROSURGICAL) IMPLANT
GAUZE SPONGE 4X4 16PLY XRAY LF (GAUZE/BANDAGES/DRESSINGS) ×2 IMPLANT
GLOVE BIO SURGEON STRL SZ 6.5 (GLOVE) ×1 IMPLANT
GLOVE ECLIPSE 7.5 STRL STRAW (GLOVE) ×2 IMPLANT
GLOVE INDICATOR 6.5 STRL GRN (GLOVE) ×1 IMPLANT
GLOVE INDICATOR 7.5 STRL GRN (GLOVE) ×1 IMPLANT
GOWN STRL REUS W/ TWL LRG LVL3 (GOWN DISPOSABLE) ×1 IMPLANT
GOWN STRL REUS W/ TWL XL LVL3 (GOWN DISPOSABLE) ×1 IMPLANT
GOWN STRL REUS W/TWL LRG LVL3 (GOWN DISPOSABLE) ×4 IMPLANT
GOWN STRL REUS W/TWL XL LVL3 (GOWN DISPOSABLE)
KIT ROOM TURNOVER WOR (KITS) ×2 IMPLANT
LEGGING LITHOTOMY PAIR STRL (DRAPES) ×1 IMPLANT
NDL SAFETY ECLIPSE 18X1.5 (NEEDLE) ×1 IMPLANT
NDL SPNL 22GX3.5 QUINCKE BK (NEEDLE) ×1 IMPLANT
NEEDLE HYPO 18GX1.5 SHARP (NEEDLE) ×2
NEEDLE SPNL 22GX3.5 QUINCKE BK (NEEDLE) ×2 IMPLANT
NS IRRIG 500ML POUR BTL (IV SOLUTION) ×1 IMPLANT
PACK BASIN DAY SURGERY FS (CUSTOM PROCEDURE TRAY) ×2 IMPLANT
PAD DRESSING TELFA 3X8 NADH (GAUZE/BANDAGES/DRESSINGS) ×1 IMPLANT
PAD OB MATERNITY 4.3X12.25 (PERSONAL CARE ITEMS) ×2 IMPLANT
PAD PREP 24X48 CUFFED NSTRL (MISCELLANEOUS) ×2 IMPLANT
PENCIL BUTTON HOLSTER BLD 10FT (ELECTRODE) IMPLANT
SCOPETTES 8  STERILE (MISCELLANEOUS) ×1
SCOPETTES 8 STERILE (MISCELLANEOUS) ×1 IMPLANT
SUT VIC AB 2-0 SH 27 (SUTURE) ×2
SUT VIC AB 2-0 SH 27XBRD (SUTURE) ×1 IMPLANT
SYR CONTROL 10ML LL (SYRINGE) ×2 IMPLANT
SYRINGE LUER LOK 1CC (MISCELLANEOUS) ×2 IMPLANT
TOWEL OR 17X24 6PK STRL BLUE (TOWEL DISPOSABLE) ×4 IMPLANT
TRAY DSU PREP LF (CUSTOM PROCEDURE TRAY) ×1 IMPLANT
TUBE CONNECTING 12X1/4 (SUCTIONS) ×2 IMPLANT
VACUUM HOSE 7/8X10 W/ WAND (MISCELLANEOUS) ×1 IMPLANT
VACUUM HOSE/TUBING 7/8INX6FT (MISCELLANEOUS) IMPLANT
WATER STERILE IRR 500ML POUR (IV SOLUTION) ×2 IMPLANT
YANKAUER SUCT BULB TIP NO VENT (SUCTIONS) ×1 IMPLANT

## 2016-02-22 NOTE — Op Note (Signed)
  OPERATIVE NOTE  Preoperative Diagnosis: VAIN I  Postoperative Diagnosis: VAIN I  Procedure(s) Performed:Co2 laser of the vagina  Surgeon: Francetta Found.  Skeet Latch, M.D. PhD   Anesthesia:LMA   Indication for Procedure: VAIN I  Operative Findings: Absent uptake of Lugols of the vaginal apex  Procedure:Emily Greer was taken to the OR and timeout performed.  She was then placed under general anesthesia and her feet placed in the dorsal lithotomy position.    KEYARIA LAWSON was prepped and draped in the usual sterile fashion.  A second time out was performed. Lugols solution was placed at the apex of the vagina and the area of nonuptake noted at the cuff.  The area was ablated using the laser with wattage of 8W and continuous pulse.  Hemostasis was assured. silvadene was applied to the cuff.  Specimens: None  Estimated Blood Loss: min  mL.   Urine Output:   Sponge, lap and needle counts were correct x 3.    Complications:None  The patient had sequential compression devices for VTE prophylaxis.          Disposition: PACU - hemodynamically stable.         Condition: stable

## 2016-02-22 NOTE — Anesthesia Postprocedure Evaluation (Signed)
Anesthesia Post Note  Patient: TANESHIA LORENCE  Procedure(s) Performed: Procedure(s) (LRB): CO2 LASER APPLICATION (N/A)  Patient location during evaluation: PACU Anesthesia Type: General Level of consciousness: awake and alert Pain management: pain level controlled Vital Signs Assessment: post-procedure vital signs reviewed and stable Respiratory status: spontaneous breathing, nonlabored ventilation, respiratory function stable and patient connected to nasal cannula oxygen Cardiovascular status: blood pressure returned to baseline and stable Postop Assessment: no signs of nausea or vomiting Anesthetic complications: no       Last Vitals:  Vitals:   02/22/16 1115 02/22/16 1130  BP: 126/76 137/77  Pulse: 84 86  Resp: 12 18  Temp:      Last Pain:  Vitals:   02/22/16 0935  TempSrc: Oral                 Lazarus Sudbury DAVID

## 2016-02-22 NOTE — Anesthesia Preprocedure Evaluation (Signed)
Anesthesia Evaluation  Patient identified by MRN, date of birth, ID band Patient awake    Reviewed: Allergy & Precautions, NPO status , Patient's Chart, lab work & pertinent test results  History of Anesthesia Complications (+) PONV  Airway Mallampati: I  TM Distance: >3 FB Neck ROM: Full    Dental   Pulmonary    Pulmonary exam normal        Cardiovascular hypertension, Pt. on medications Normal cardiovascular exam     Neuro/Psych    GI/Hepatic GERD  Medicated and Controlled,  Endo/Other    Renal/GU      Musculoskeletal   Abdominal   Peds  Hematology   Anesthesia Other Findings   Reproductive/Obstetrics                             Anesthesia Physical Anesthesia Plan  ASA: II  Anesthesia Plan: General   Post-op Pain Management:    Induction: Intravenous  Airway Management Planned: LMA  Additional Equipment:   Intra-op Plan:   Post-operative Plan: Extubation in OR  Informed Consent: I have reviewed the patients History and Physical, chart, labs and discussed the procedure including the risks, benefits and alternatives for the proposed anesthesia with the patient or authorized representative who has indicated his/her understanding and acceptance.     Plan Discussed with: CRNA and Surgeon  Anesthesia Plan Comments:         Anesthesia Quick Evaluation

## 2016-02-22 NOTE — Anesthesia Procedure Notes (Signed)
Procedure Name: LMA Insertion Date/Time: 02/22/2016 10:19 AM Performed by: Bethena Roys T Pre-anesthesia Checklist: Patient identified, Emergency Drugs available, Suction available and Patient being monitored Patient Re-evaluated:Patient Re-evaluated prior to inductionOxygen Delivery Method: Circle system utilized Preoxygenation: Pre-oxygenation with 100% oxygen Intubation Type: IV induction Ventilation: Mask ventilation without difficulty LMA: LMA inserted LMA Size: 4.0 Number of attempts: 1 Airway Equipment and Method: Bite block Placement Confirmation: positive ETCO2 Tube secured with: Tape Dental Injury: Teeth and Oropharynx as per pre-operative assessment

## 2016-02-22 NOTE — H&P (Addendum)
PRE OP NOTE  CC:   VAIN I  Assessment:   66 y.o.  year old with Stage1A Grade 2 endometrioid endometrial cancer staged 10/2011 with recurrence at the distal vagina identified in April 2014.  Subsequent PET scan was negative for evidence of metastatic disease and Sheril Hammond WoodwardCompleted vaginal  brachytherapy 07/29/2012.  Second vaginal recurrence (apex) diagnosed December 2016. Imaging without evidence of  metastatic disease.  07/2015.  On Femara   Abnormal Pap Pap 10/2015 ASCUS HPV + Colposcopy with bx 11/30 c/w VAIN I Plan for Co2 laser 02/22/2016   Bilateral femoral necrosis on imaging Counselled on safe ambulation   IHCC positive testing Declined genetic counseling Consider MSI testing of tumor for consideration of  PDL1 checkpoint inhibitor if there is recurrence   HPI:  Emily Greer is a 66 y.o. year old G3P2 initially seen in consultation on 10/05/2011  grade 1  endometrial cancer  She then underwent a  total abdominal hysterectomy bilateral salpingo-oophorectomy bilateral pelvic lymph node dissection on  33/29/5188 without complications.  Her postoperative course was  uncomplicated.  Her final pathologic diagnosis is a Stage  1A Grade  2 endometrioid endometrial cancer with  negative lymphovascular space invasion,  2/20 (10%) of myometrial invasion and negative lymph nodes.  On 01/2012 visit she reported  post coital vaginal bleeding  that was self limiting. Vaginal biopsy - LARGELY DENUDED SQUAMOUS EPITHELIUM WITH ASSOCIATED SPONGIOSIS AND CHRONIC INFLAMMATION.- NO DYSPLASIA OR MALIGNANCY  On the visit 04/2012 she c/o vaginal bleeding.  Biopsies were c/w metastatic disease at the distal vagina  PET 06/05/2012 IMPRESSION:  1. There are no specific features identified to suggest  hypermetabolic metastasis from endometrial carcinoma.  2. Skeletal changes of multiple myeloma with focal area of increased uptake   Treated with vaginal brachytherapy June 4, June 11,  June 19, June 25, June 30/2014 Site/dose: Vagina, 30.5 Gy in 5 fractions (6 Gy, 6 Gy, 6 Gy, 6 Gy, 6.5 Gy)  Pap 08/17/2013 ASCUS HPV neg  She was seen by Dr Leo Grosser on 01/21/15 at which time a friable lesion was noted at the vaginal cuff. This was biopsied and was consistent with endometrioid adenocarcinoma (recurrence).  CT C/A/P 02/15/2015 without evidence of metastatic disease.  Care complicated by successful transplant 2016 for Rx multiple myeloma. Received conformational radiotherapy completed 03/2015 and then started on femara  CT C/A/P 07/26/2015 - PET declined by insurance- 1. Stable examinations demonstrating no evidence of metastatic endometrial carcinoma. 2. Stable widespread osseous findings attributed to multiple myeloma. No evidence of pathologic fracture or epidural tumor. 3. Grossly stable small left thyroid nodule. 4. Chronic bilateral femoral head avascular necrosis.  Interval history: Pap collected by Dr. Leo Grosser in October 2017 ASCUS positive for high risk HPV virus.  Colposcopy 12/29/2016 c/w VAIN I      Past Medical History:  Diagnosis Date  . Elevated hemoglobin A1c    Borderline  . Endometrial carcinoma ([place]) 05/28/12  . Fibroid 07/26/10   asymptomatic  . H/O multiple myeloma 11/28/1997  . History of radiation therapy 6/4, 6/11, 6/19, 6/25, 07/29/2012   vagina 30.5 gray in 5 fx, HDR brachytherapy  . Hypertension 11/28/97  . Increased BMI 12/18/05  . Lambda light chain myeloma ([place]) 11/11/2007  . Osteoporosis 12/18/05   Increased  risk   . PONV (postoperative nausea and vomiting)    nausea in past, none recent  . Post-menopausal bleeding 12/19/06  . Pregnancy induced hypertension   . Radiation 03/10/15-04/20/15   vagina 50.4 gray  .  Staphylococcus aureus bacteremia 11/28/1997  . Staphylococcus epidermidis bacteremia 11/28/97  . Vaginal atrophy 12/19/06  . Vaginal atrophy           Past Surgical History:  Procedure Laterality Date  .  ABDOMINAL HYSTERECTOMY  10/31/2011   Procedure: HYSTERECTOMY ABDOMINAL;  Surgeon: [name], MD PHD;  Location: [facility];  Service: Gynecology;  Laterality: N/A;  . ECTOPIC PREGNANCY SURGERY  1992  . HYSTEROSCOPY W/D&C  09/27/2011   Procedure: DILATATION AND CURETTAGE /HYSTEROSCOPY;  Surgeon: Eldred Manges, MD;  Location: [facility];  Service: Gynecology;;  . LAPAROTOMY  10/31/2011   Procedure: EXPLORATORY LAPAROTOMY;  Surgeon: [name], MD PHD;  Location: [facility];  Service: Gynecology;  Laterality: N/A;  . SALPINGOOPHORECTOMY  10/31/2011   Procedure: SALPINGO OOPHERECTOMY;  Surgeon: [name], MD PHD;  Location: [facility];  Service: Gynecology;  Laterality: Bilateral;   Lymph Nodes sampling  . 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 Just moved to a new home.  Vacation to [place] scheduled for 08/2015  Review of systems: Constitutional:  She has no fever or chills. No changes in weight.  Cardiovascular: No chest pain, palpitations or edema. Respiratory:  No shortness of breath, wheezing or cough Gastrointestinal: She has normal bowel movements without diarrhea or constipation. She denies any nausea or vomiting.   Genitourinary:  She denies pelvic pain, pelvic pressure or changes in her urinary function. + vaginal bleeding (light) post coital Otherwise uninformative 10 point review of system   Physical Exam:    Wt Readings from Last 3 Encounters:  12/30/15 181 lb 12.8 oz (82.5 kg)  11/30/15 185 lb 6.4 oz (84.1 kg)  11/03/15 184 lb (83.5 kg)   General: Well dressed, well nourished in no apparent distress.   Abdomen:  Soft, nontender, nondistended.  No palpable masses.  No hepatosplenomegaly.  No ascites. Normal bowel sounds.  No hernias.   Genitourinary: exam 12/2015 Normal EGBUS radiation changes at the vaginal apex.  Vagina foreshortened.  Smooth to touch, friable with manipulation of the  speculum, Acetic acid applied, colposcopy performed.  No acetowhite changes, random bx of the vaginal cuff.  Rectal:  Deferred.  Back: No CVA tenderness  LN:  No cervical supraclavicular or inguinal adenopathy

## 2016-02-22 NOTE — Interval H&P Note (Signed)
History and Physical Interval Note:  02/22/2016 10:01 AM  Emily Greer  has presented today for surgery, with the diagnosis of endometrial cancer  The various methods of treatment have been discussed with the patient and family. After consideration of risks, benefits and other options for treatment, the patient has consented to  Procedure(s): CO2 LASER APPLICATION (N/A) as a surgical intervention .  The patient's history has been reviewed, patient examined, no change in status, stable for surgery.  I have reviewed the patient's chart and labs.  Questions were answered to the patient's satisfaction.     [place], [facility]

## 2016-02-22 NOTE — Transfer of Care (Signed)
Immediate Anesthesia Transfer of Care Note  Patient: Emily Greer  Procedure(s) Performed: Procedure(s): CO2 LASER APPLICATION (N/A)  Patient Location: PACU  Anesthesia Type:General  Level of Consciousness: awake and oriented  Airway & Oxygen Therapy: Patient Spontanous Breathing and Patient connected to nasal cannula oxygen  Post-op Assessment: Report given to RN  Post vital signs: Reviewed and stable  Last Vitals: 126/93, 87, 10, 95% Vitals:   02/22/16 0935  BP: (!) 152/81  Pulse: 98  Resp: 16  Temp: 36.9 C    Last Pain:  Vitals:   02/22/16 0935  TempSrc: Oral      Patients Stated Pain Goal: 5 (38/46/65 9935)  Complications: No apparent anesthesia complications

## 2016-02-23 ENCOUNTER — Encounter (HOSPITAL_BASED_OUTPATIENT_CLINIC_OR_DEPARTMENT_OTHER): Payer: Self-pay | Admitting: Gynecologic Oncology

## 2016-02-25 ENCOUNTER — Ambulatory Visit (HOSPITAL_BASED_OUTPATIENT_CLINIC_OR_DEPARTMENT_OTHER): Payer: BC Managed Care – PPO | Admitting: Hematology & Oncology

## 2016-02-25 ENCOUNTER — Ambulatory Visit (HOSPITAL_BASED_OUTPATIENT_CLINIC_OR_DEPARTMENT_OTHER): Payer: BC Managed Care – PPO

## 2016-02-25 ENCOUNTER — Other Ambulatory Visit: Payer: Self-pay | Admitting: *Deleted

## 2016-02-25 ENCOUNTER — Other Ambulatory Visit (HOSPITAL_BASED_OUTPATIENT_CLINIC_OR_DEPARTMENT_OTHER): Payer: BC Managed Care – PPO

## 2016-02-25 VITALS — BP 131/70 | HR 89 | Temp 98.1°F | Resp 16 | Wt 193.8 lb

## 2016-02-25 DIAGNOSIS — C9001 Multiple myeloma in remission: Secondary | ICD-10-CM

## 2016-02-25 DIAGNOSIS — C9000 Multiple myeloma not having achieved remission: Secondary | ICD-10-CM

## 2016-02-25 DIAGNOSIS — Z5112 Encounter for antineoplastic immunotherapy: Secondary | ICD-10-CM | POA: Diagnosis not present

## 2016-02-25 LAB — CBC WITH DIFFERENTIAL (CANCER CENTER ONLY)
BASO#: 0 10*3/uL (ref 0.0–0.2)
BASO%: 0.6 % (ref 0.0–2.0)
EOS%: 2.9 % (ref 0.0–7.0)
Eosinophils Absolute: 0.2 10*3/uL (ref 0.0–0.5)
HCT: 34.3 % — ABNORMAL LOW (ref 34.8–46.6)
HGB: 11.4 g/dL — ABNORMAL LOW (ref 11.6–15.9)
LYMPH#: 1.9 10*3/uL (ref 0.9–3.3)
LYMPH%: 26.4 % (ref 14.0–48.0)
MCH: 27.7 pg (ref 26.0–34.0)
MCHC: 33.2 g/dL (ref 32.0–36.0)
MCV: 84 fL (ref 81–101)
MONO#: 1.2 10*3/uL — ABNORMAL HIGH (ref 0.1–0.9)
MONO%: 16.2 % — ABNORMAL HIGH (ref 0.0–13.0)
NEUT#: 3.9 10*3/uL (ref 1.5–6.5)
NEUT%: 53.9 % (ref 39.6–80.0)
Platelets: 210 10*3/uL (ref 145–400)
RBC: 4.11 10*6/uL (ref 3.70–5.32)
RDW: 17 % — ABNORMAL HIGH (ref 11.1–15.7)
WBC: 7.2 10*3/uL (ref 3.9–10.0)

## 2016-02-25 LAB — CMP (CANCER CENTER ONLY)
ALT(SGPT): 24 U/L (ref 10–47)
AST: 17 U/L (ref 11–38)
Albumin: 4 g/dL (ref 3.3–5.5)
Alkaline Phosphatase: 53 U/L (ref 26–84)
BUN, Bld: 14 mg/dL (ref 7–22)
CO2: 26 mEq/L (ref 18–33)
Calcium: 9.5 mg/dL (ref 8.0–10.3)
Chloride: 103 mEq/L (ref 98–108)
Creat: 1.2 mg/dl (ref 0.6–1.2)
Glucose, Bld: 85 mg/dL (ref 73–118)
Potassium: 3.6 mEq/L (ref 3.3–4.7)
Sodium: 137 mEq/L (ref 128–145)
Total Bilirubin: 0.6 mg/dl (ref 0.20–1.60)
Total Protein: 7.4 g/dL (ref 6.4–8.1)

## 2016-02-25 LAB — LACTATE DEHYDROGENASE: LDH: 186 U/L (ref 125–245)

## 2016-02-25 MED ORDER — ONDANSETRON HCL 8 MG PO TABS
8.0000 mg | ORAL_TABLET | Freq: Once | ORAL | Status: AC
  Administered 2016-02-25: 8 mg via ORAL

## 2016-02-25 MED ORDER — BORTEZOMIB CHEMO SQ INJECTION 3.5 MG (2.5MG/ML)
1.2000 mg/m2 | Freq: Once | INTRAMUSCULAR | Status: AC
  Administered 2016-02-25: 2.5 mg via SUBCUTANEOUS
  Filled 2016-02-25: qty 2.5

## 2016-02-25 MED ORDER — ONDANSETRON HCL 8 MG PO TABS
ORAL_TABLET | ORAL | Status: AC
  Filled 2016-02-25: qty 1

## 2016-02-25 NOTE — Progress Notes (Signed)
Hematology and Oncology Follow Up Visit  Emily Greer [id number] 1950-11-08 66 y.o. 02/25/2016   Principle Diagnosis:   Recurrent lambda light chain myeloma  History of recurrent endometrial carcinoma  Current Therapy:    Status post second autologous stem cell transplant on 07/24/2014  Maintenance therapy with Pomalidomide/every 2 week Velcade  Xgeva 120 mg subcutaneous every 3 months-start in April 2018  Radiation therapy for endometrial recurrence - completed 04/20/2015  Femara 2.5mg  po q day     Interim History:  [name] is back for followup. She feels a little bit tired today. She had laser surgery for cervical dysplasia early this week. She says she was has a tough time with the anesthesia.   She did have a nice Christmas and New Year's. She and her husband have been traveling. Unfortunately, his sister, who lives in [place], has breast cancer. They will be going down there probably in February to help her out a little bit.   She has done very well with her treatments. This Percell Miller has tolerated treatment nicely. She has had a very stable light chain. Her lambda light chain back in late December was 2.4 mg/dL. Her last and spiked back in December was 0.2. This tends to fluctuate and be quite low. Some of this might have been from her cervical dysplasia.  She's had a problem with fever. She's had no cough. She's had no obvious change in bowel or bladder habits. She's had no leg swelling. She has gained a little weight.   Overall, her performance status is ECOG 1.  Medications: Allergies:  Allergies  Allergen Reactions  . Codeine Nausea Only    Past Medical History, Surgical history, Social history, and Family History were reviewed and updated.  Review of Systems: As above  Physical Exam:  weight is 193 lb 12.8 oz (87.9 kg). Her oral temperature is 98.1 F (36.7 C). Her blood pressure is 131/70 and her pulse is 89. Her respiration is 16 and oxygen  saturation is 99%.   Well-developed and well-nourished [name]. Head and neck exam shows no ocular or oral lesion. There are no palpable cervical or supraclavicular lymph nodes. Lungs are clear. Cardiac exam regular rate and rhythm with no murmurs rubs or bruits. Abdomen is soft. She has good bowel sounds. There is no fluid wave. There is no palpable liver or spleen tip. Extremities shows no clubbing, cyanosis or edema. Neurological exam shows no focal neurological deficits. Skin exam no rashes, ecchymosis or petechia.  Lab Results  Component Value Date   WBC 7.2 02/25/2016   HGB 11.4 (L) 02/25/2016   HCT 34.3 (L) 02/25/2016   MCV 84 02/25/2016   PLT 210 02/25/2016     Chemistry      Component Value Date/Time   NA 137 02/25/2016 1200   NA 141 03/17/2015 1018   K 3.6 02/25/2016 1200   K 3.2 (L) 03/17/2015 1018   CL 103 02/25/2016 1200   CO2 26 02/25/2016 1200   CO2 25 03/17/2015 1018   BUN 14 02/25/2016 1200   BUN 21.3 03/17/2015 1018   CREATININE 1.2 02/25/2016 1200   CREATININE 1.0 03/17/2015 1018      Component Value Date/Time   CALCIUM 9.5 02/25/2016 1200   CALCIUM 9.6 03/17/2015 1018   ALKPHOS 53 02/25/2016 1200   ALKPHOS 47 03/17/2015 1018   AST 17 02/25/2016 1200   AST 17 03/17/2015 1018   ALT 24 02/25/2016 1200   ALT 14 03/17/2015 1018  BILITOT 0.60 02/25/2016 1200   BILITOT 0.54 03/17/2015 1018         Impression and Plan: Emily Greer is 66 year old female. She has recurrent lambda light chain myeloma. She also had recurrent endometrial cancer. The recurrence was localized. She is being followed by OB/GYN and radiation oncology for this. She completed radiation therapy back in March. She had 50.4 rad for this.   We will go ahead with Velcade today. We do this every 2 weeks.  Again, we will hold on the [place] probably until next May. Now that the FDA has approved Xgeva for myeloma, I think we can get her onto [facility]. She will speak with the oral  surgeon and try to get the oral surgery taking care of in February   We will continue the Velcade every 2 weeks.  I think we probably get her back in 6 weeks now.  [name], MD 1/26/201812:56 PM

## 2016-02-25 NOTE — Patient Instructions (Signed)

## 2016-02-25 NOTE — Addendum Note (Signed)
Addended by: Burney Gauze R on: 02/25/2016 01:03 PM   Modules accepted: Orders

## 2016-02-26 LAB — IGG, IGA, IGM
IgA, Qn, Serum: 147 mg/dL (ref 87–352)
IgG, Qn, Serum: 879 mg/dL (ref 700–1600)
IgM, Qn, Serum: 34 mg/dL (ref 26–217)

## 2016-02-28 LAB — KAPPA/LAMBDA LIGHT CHAINS
Ig Kappa Free Light Chain: 14.4 mg/L (ref 3.3–19.4)
Ig Lambda Free Light Chain: 24.4 mg/L (ref 5.7–26.3)
Kappa/Lambda FluidC Ratio: 0.59 (ref 0.26–1.65)

## 2016-02-29 LAB — PROTEIN ELECTROPHORESIS, SERUM
A/G Ratio: 1.2 (ref 0.7–1.7)
Albumin: 3.8 g/dL (ref 2.9–4.4)
Alpha 1: 0.2 g/dL (ref 0.0–0.4)
Alpha 2: 0.9 g/dL (ref 0.4–1.0)
Beta: 1.3 g/dL (ref 0.7–1.3)
Gamma Globulin: 0.8 g/dL (ref 0.4–1.8)
Globulin, Total: 3.2 g/dL (ref 2.2–3.9)
Total Protein: 7 g/dL (ref 6.0–8.5)

## 2016-03-06 ENCOUNTER — Other Ambulatory Visit: Payer: Self-pay | Admitting: Hematology & Oncology

## 2016-03-06 DIAGNOSIS — C9000 Multiple myeloma not having achieved remission: Secondary | ICD-10-CM

## 2016-03-06 DIAGNOSIS — C9001 Multiple myeloma in remission: Secondary | ICD-10-CM

## 2016-03-06 DIAGNOSIS — I10 Essential (primary) hypertension: Secondary | ICD-10-CM

## 2016-03-09 ENCOUNTER — Other Ambulatory Visit: Payer: Self-pay

## 2016-03-09 DIAGNOSIS — C9001 Multiple myeloma in remission: Secondary | ICD-10-CM

## 2016-03-10 ENCOUNTER — Other Ambulatory Visit: Payer: BC Managed Care – PPO

## 2016-03-10 ENCOUNTER — Ambulatory Visit: Payer: BC Managed Care – PPO

## 2016-03-13 ENCOUNTER — Ambulatory Visit (HOSPITAL_BASED_OUTPATIENT_CLINIC_OR_DEPARTMENT_OTHER): Payer: BC Managed Care – PPO

## 2016-03-13 ENCOUNTER — Telehealth: Payer: Self-pay | Admitting: *Deleted

## 2016-03-13 ENCOUNTER — Other Ambulatory Visit (HOSPITAL_BASED_OUTPATIENT_CLINIC_OR_DEPARTMENT_OTHER): Payer: BC Managed Care – PPO

## 2016-03-13 VITALS — BP 128/75 | HR 87 | Temp 98.1°F | Resp 17

## 2016-03-13 DIAGNOSIS — C9000 Multiple myeloma not having achieved remission: Secondary | ICD-10-CM

## 2016-03-13 DIAGNOSIS — C9001 Multiple myeloma in remission: Secondary | ICD-10-CM

## 2016-03-13 DIAGNOSIS — Z5112 Encounter for antineoplastic immunotherapy: Secondary | ICD-10-CM | POA: Diagnosis not present

## 2016-03-13 LAB — CBC WITH DIFFERENTIAL (CANCER CENTER ONLY)
BASO#: 0.1 10*3/uL (ref 0.0–0.2)
BASO%: 2.6 % — ABNORMAL HIGH (ref 0.0–2.0)
EOS%: 2.9 % (ref 0.0–7.0)
Eosinophils Absolute: 0.1 10*3/uL (ref 0.0–0.5)
HCT: 33.3 % — ABNORMAL LOW (ref 34.8–46.6)
HGB: 11.2 g/dL — ABNORMAL LOW (ref 11.6–15.9)
LYMPH#: 1.3 10*3/uL (ref 0.9–3.3)
LYMPH%: 33.5 % (ref 14.0–48.0)
MCH: 27.9 pg (ref 26.0–34.0)
MCHC: 33.6 g/dL (ref 32.0–36.0)
MCV: 83 fL (ref 81–101)
MONO#: 0.8 10*3/uL (ref 0.1–0.9)
MONO%: 19.9 % — ABNORMAL HIGH (ref 0.0–13.0)
NEUT#: 1.6 10*3/uL (ref 1.5–6.5)
NEUT%: 41.1 % (ref 39.6–80.0)
Platelets: 248 10*3/uL (ref 145–400)
RBC: 4.02 10*6/uL (ref 3.70–5.32)
RDW: 16.6 % — ABNORMAL HIGH (ref 11.1–15.7)
WBC: 3.8 10*3/uL — ABNORMAL LOW (ref 3.9–10.0)

## 2016-03-13 LAB — CMP (CANCER CENTER ONLY)
ALT(SGPT): 22 U/L (ref 10–47)
AST: 22 U/L (ref 11–38)
Albumin: 3.9 g/dL (ref 3.3–5.5)
Alkaline Phosphatase: 44 U/L (ref 26–84)
BUN, Bld: 12 mg/dL (ref 7–22)
CO2: 27 mEq/L (ref 18–33)
Calcium: 9.8 mg/dL (ref 8.0–10.3)
Chloride: 103 mEq/L (ref 98–108)
Creat: 1 mg/dl (ref 0.6–1.2)
Glucose, Bld: 90 mg/dL (ref 73–118)
Potassium: 3 mEq/L — CL (ref 3.3–4.7)
Sodium: 141 mEq/L (ref 128–145)
Total Bilirubin: 0.6 mg/dl (ref 0.20–1.60)
Total Protein: 6.9 g/dL (ref 6.4–8.1)

## 2016-03-13 LAB — LACTATE DEHYDROGENASE: LDH: 171 U/L (ref 125–245)

## 2016-03-13 MED ORDER — ONDANSETRON HCL 8 MG PO TABS
ORAL_TABLET | ORAL | Status: AC
  Filled 2016-03-13: qty 1

## 2016-03-13 MED ORDER — ONDANSETRON HCL 8 MG PO TABS
8.0000 mg | ORAL_TABLET | Freq: Once | ORAL | Status: AC
  Administered 2016-03-13: 8 mg via ORAL

## 2016-03-13 MED ORDER — BORTEZOMIB CHEMO SQ INJECTION 3.5 MG (2.5MG/ML)
1.2000 mg/m2 | Freq: Once | INTRAMUSCULAR | Status: AC
  Administered 2016-03-13: 2.5 mg via SUBCUTANEOUS
  Filled 2016-03-13: qty 2.5

## 2016-03-13 NOTE — Patient Instructions (Signed)

## 2016-03-13 NOTE — Telephone Encounter (Signed)
Critical Value Potassium 3.0 Dr Ennever notified. No orders at this time.  

## 2016-03-24 ENCOUNTER — Other Ambulatory Visit: Payer: Self-pay | Admitting: *Deleted

## 2016-03-24 ENCOUNTER — Ambulatory Visit: Payer: BC Managed Care – PPO

## 2016-03-24 ENCOUNTER — Other Ambulatory Visit: Payer: BC Managed Care – PPO

## 2016-03-24 ENCOUNTER — Other Ambulatory Visit: Payer: Self-pay | Admitting: Hematology & Oncology

## 2016-03-24 MED ORDER — POMALIDOMIDE 1 MG PO CAPS: ORAL_CAPSULE | ORAL | 0 refills | Status: DC

## 2016-03-28 ENCOUNTER — Other Ambulatory Visit: Payer: Self-pay | Admitting: *Deleted

## 2016-03-28 DIAGNOSIS — C541 Malignant neoplasm of endometrium: Secondary | ICD-10-CM

## 2016-03-29 ENCOUNTER — Ambulatory Visit (HOSPITAL_BASED_OUTPATIENT_CLINIC_OR_DEPARTMENT_OTHER): Payer: BC Managed Care – PPO

## 2016-03-29 ENCOUNTER — Other Ambulatory Visit (HOSPITAL_BASED_OUTPATIENT_CLINIC_OR_DEPARTMENT_OTHER): Payer: BC Managed Care – PPO

## 2016-03-29 VITALS — BP 152/84 | HR 86 | Temp 98.2°F | Resp 18

## 2016-03-29 DIAGNOSIS — C9000 Multiple myeloma not having achieved remission: Secondary | ICD-10-CM

## 2016-03-29 DIAGNOSIS — C541 Malignant neoplasm of endometrium: Secondary | ICD-10-CM

## 2016-03-29 DIAGNOSIS — Z5112 Encounter for antineoplastic immunotherapy: Secondary | ICD-10-CM

## 2016-03-29 LAB — CMP (CANCER CENTER ONLY)
ALT(SGPT): 23 U/L (ref 10–47)
AST: 23 U/L (ref 11–38)
Albumin: 3.7 g/dL (ref 3.3–5.5)
Alkaline Phosphatase: 49 U/L (ref 26–84)
BUN, Bld: 11 mg/dL (ref 7–22)
CO2: 25 mEq/L (ref 18–33)
Calcium: 9.2 mg/dL (ref 8.0–10.3)
Chloride: 103 mEq/L (ref 98–108)
Creat: 1 mg/dl (ref 0.6–1.2)
Glucose, Bld: 83 mg/dL (ref 73–118)
Potassium: 3.5 mEq/L (ref 3.3–4.7)
Sodium: 139 mEq/L (ref 128–145)
Total Bilirubin: 0.6 mg/dl (ref 0.20–1.60)
Total Protein: 7 g/dL (ref 6.4–8.1)

## 2016-03-29 LAB — CBC WITH DIFFERENTIAL (CANCER CENTER ONLY)
BASO#: 0.1 10*3/uL (ref 0.0–0.2)
BASO%: 2.7 % — ABNORMAL HIGH (ref 0.0–2.0)
EOS%: 3.8 % (ref 0.0–7.0)
Eosinophils Absolute: 0.1 10*3/uL (ref 0.0–0.5)
HCT: 33.5 % — ABNORMAL LOW (ref 34.8–46.6)
HGB: 11.3 g/dL — ABNORMAL LOW (ref 11.6–15.9)
LYMPH#: 1.4 10*3/uL (ref 0.9–3.3)
LYMPH%: 38.2 % (ref 14.0–48.0)
MCH: 27.8 pg (ref 26.0–34.0)
MCHC: 33.7 g/dL (ref 32.0–36.0)
MCV: 83 fL (ref 81–101)
MONO#: 0.7 10*3/uL (ref 0.1–0.9)
MONO%: 18.4 % — ABNORMAL HIGH (ref 0.0–13.0)
NEUT#: 1.4 10*3/uL — ABNORMAL LOW (ref 1.5–6.5)
NEUT%: 36.9 % — ABNORMAL LOW (ref 39.6–80.0)
Platelets: 194 10*3/uL (ref 145–400)
RBC: 4.06 10*6/uL (ref 3.70–5.32)
RDW: 16 % — ABNORMAL HIGH (ref 11.1–15.7)
WBC: 3.7 10*3/uL — ABNORMAL LOW (ref 3.9–10.0)

## 2016-03-29 MED ORDER — ONDANSETRON HCL 8 MG PO TABS
8.0000 mg | ORAL_TABLET | Freq: Once | ORAL | Status: AC
  Administered 2016-03-29: 8 mg via ORAL

## 2016-03-29 MED ORDER — ONDANSETRON HCL 8 MG PO TABS
ORAL_TABLET | ORAL | Status: AC
  Filled 2016-03-29: qty 1

## 2016-03-29 MED ORDER — BORTEZOMIB CHEMO SQ INJECTION 3.5 MG (2.5MG/ML)
1.2000 mg/m2 | Freq: Once | INTRAMUSCULAR | Status: AC
  Administered 2016-03-29: 2.5 mg via SUBCUTANEOUS
  Filled 2016-03-29: qty 2.5

## 2016-03-29 NOTE — Patient Instructions (Signed)

## 2016-04-07 ENCOUNTER — Ambulatory Visit: Payer: BC Managed Care – PPO

## 2016-04-07 ENCOUNTER — Ambulatory Visit: Payer: BC Managed Care – PPO | Admitting: Hematology & Oncology

## 2016-04-07 ENCOUNTER — Other Ambulatory Visit: Payer: BC Managed Care – PPO

## 2016-04-12 ENCOUNTER — Ambulatory Visit (HOSPITAL_BASED_OUTPATIENT_CLINIC_OR_DEPARTMENT_OTHER): Payer: BC Managed Care – PPO

## 2016-04-12 ENCOUNTER — Encounter: Payer: Self-pay | Admitting: Family

## 2016-04-12 ENCOUNTER — Other Ambulatory Visit (HOSPITAL_BASED_OUTPATIENT_CLINIC_OR_DEPARTMENT_OTHER): Payer: BC Managed Care – PPO

## 2016-04-12 ENCOUNTER — Ambulatory Visit (HOSPITAL_BASED_OUTPATIENT_CLINIC_OR_DEPARTMENT_OTHER): Payer: BC Managed Care – PPO | Admitting: Family

## 2016-04-12 VITALS — BP 126/69 | HR 82 | Temp 98.0°F | Resp 16 | Wt 193.1 lb

## 2016-04-12 DIAGNOSIS — Z79811 Long term (current) use of aromatase inhibitors: Secondary | ICD-10-CM | POA: Diagnosis not present

## 2016-04-12 DIAGNOSIS — C9000 Multiple myeloma not having achieved remission: Secondary | ICD-10-CM | POA: Diagnosis not present

## 2016-04-12 DIAGNOSIS — C7982 Secondary malignant neoplasm of genital organs: Secondary | ICD-10-CM

## 2016-04-12 DIAGNOSIS — C9001 Multiple myeloma in remission: Secondary | ICD-10-CM | POA: Diagnosis not present

## 2016-04-12 DIAGNOSIS — Z5112 Encounter for antineoplastic immunotherapy: Secondary | ICD-10-CM | POA: Diagnosis not present

## 2016-04-12 DIAGNOSIS — C541 Malignant neoplasm of endometrium: Secondary | ICD-10-CM

## 2016-04-12 LAB — CBC WITH DIFFERENTIAL (CANCER CENTER ONLY)
BASO#: 0.1 10*3/uL (ref 0.0–0.2)
BASO%: 2.9 % — ABNORMAL HIGH (ref 0.0–2.0)
EOS%: 1.1 % (ref 0.0–7.0)
Eosinophils Absolute: 0.1 10*3/uL (ref 0.0–0.5)
HCT: 33.4 % — ABNORMAL LOW (ref 34.8–46.6)
HGB: 11 g/dL — ABNORMAL LOW (ref 11.6–15.9)
LYMPH#: 1.7 10*3/uL (ref 0.9–3.3)
LYMPH%: 34.9 % (ref 14.0–48.0)
MCH: 27.4 pg (ref 26.0–34.0)
MCHC: 32.9 g/dL (ref 32.0–36.0)
MCV: 83 fL (ref 81–101)
MONO#: 0.5 10*3/uL (ref 0.1–0.9)
MONO%: 10.1 % (ref 0.0–13.0)
NEUT#: 2.4 10*3/uL (ref 1.5–6.5)
NEUT%: 51 % (ref 39.6–80.0)
Platelets: 289 10*3/uL (ref 145–400)
RBC: 4.02 10*6/uL (ref 3.70–5.32)
RDW: 15.8 % — ABNORMAL HIGH (ref 11.1–15.7)
WBC: 4.8 10*3/uL (ref 3.9–10.0)

## 2016-04-12 LAB — CMP (CANCER CENTER ONLY)
ALT(SGPT): 23 U/L (ref 10–47)
AST: 23 U/L (ref 11–38)
Albumin: 4 g/dL (ref 3.3–5.5)
Alkaline Phosphatase: 52 U/L (ref 26–84)
BUN, Bld: 21 mg/dL (ref 7–22)
CO2: 27 mEq/L (ref 18–33)
Calcium: 9.8 mg/dL (ref 8.0–10.3)
Chloride: 104 mEq/L (ref 98–108)
Creat: 1.2 mg/dl (ref 0.6–1.2)
Glucose, Bld: 90 mg/dL (ref 73–118)
Potassium: 3.1 mEq/L — ABNORMAL LOW (ref 3.3–4.7)
Sodium: 143 mEq/L (ref 128–145)
Total Bilirubin: 0.5 mg/dl (ref 0.20–1.60)
Total Protein: 7.5 g/dL (ref 6.4–8.1)

## 2016-04-12 MED ORDER — BORTEZOMIB CHEMO SQ INJECTION 3.5 MG (2.5MG/ML)
1.2000 mg/m2 | Freq: Once | INTRAMUSCULAR | Status: AC
  Administered 2016-04-12: 2.5 mg via SUBCUTANEOUS
  Filled 2016-04-12: qty 2.5

## 2016-04-12 MED ORDER — ONDANSETRON HCL 8 MG PO TABS
ORAL_TABLET | ORAL | Status: AC
  Filled 2016-04-12: qty 1

## 2016-04-12 MED ORDER — ONDANSETRON HCL 8 MG PO TABS
8.0000 mg | ORAL_TABLET | Freq: Once | ORAL | Status: AC
  Administered 2016-04-12: 8 mg via ORAL

## 2016-04-12 NOTE — Progress Notes (Signed)
Hematology and Oncology Follow Up Visit  Emily Greer [id number] 11-29-1950 66 y.o. 04/12/2016   Principle Diagnosis:  Recurrent lambda light chain myeloma Recurrent endometrial carcinoma at the vaginal apex  Current Therapy:   Status post second autologous stem cell transplant on 07/24/2014 Maintenance therapy with daily PO Pomalidomide (on 3 weeks, off 1 week)/Velcade every 2 week Xgeva 120 mg SQ every 3 months - starting April 2018 Radiation therapy for endometrial recurrence - completed 04/20/2015 Femara 2.5 mg po q day    Interim History:  Emily Greer is here today for follow-up and Velcade. She is doing quite well and has no complaints at this time. Her counts are stable. Hgb is stable at 11.0 with an MCV of 83.  She has responded nicely on Pomalidiomide and Velcade.  Her myeloma studies have been stable. M spike in January was not observed and lambda light chain was 24.4 mg/L. Today's results are pending.  She denies fatigue and has had no problem with infections. No fever, chills, n/v, cough, rash, dizziness, SOB, chest pain, palpitations, abdominal pain or changes in bowel or bladder habits. She has occasional constipation and a hemorrhoid that will bleed a little.  She continues to follow-up with gynecology oncology next week on Tuesday. She has had no problems with vaginal bleeding and continues to tolerate Femara well.  She was able to have her wisdom tooth extracted without and problems. She feels much better.  No swelling, tenderness, numbness or tingling in her extremities. No new aches or pains. She has arthritic pain in her knees.  She has maintained a good appetite and is staying well hydrated.   Medications:  Allergies as of 04/12/2016      Reactions   Codeine Nausea Only      Medication List       Accurate as of 04/12/16  9:53 AM. Always use your most recent med list.          acyclovir 400 MG tablet Commonly known as:  ZOVIRAX Take 1 tablet (400 mg  total) by mouth 2 (two) times daily.   albuterol 108 (90 Base) MCG/ACT inhaler Commonly known as:  PROVENTIL HFA;VENTOLIN HFA Inhale 2 puffs into the lungs every 6 (six) hours as needed for wheezing or shortness of breath. 2 puffs 3 times daily x 5 days then every 6 hours as needed.   amLODipine 10 MG tablet Commonly known as:  NORVASC TAKE 1 TABLET BY MOUTH DAILY--- takes in am   amoxicillin 500 MG capsule Commonly known as:  AMOXIL 2,000 mg. Prior to dental procedure.   aspirin EC 81 MG tablet Take 81 mg by mouth 2 (two) times daily.   bortezomib IV 3.5 MG injection Commonly known as:  VELCADE Inject 1.3 mg/m2 into the vein once. Patient is taking 2.5 MG injection every other week   dexlansoprazole 60 MG capsule Commonly known as:  DEXILANT Take 60 mg by mouth every evening.   letrozole 2.5 MG tablet Commonly known as:  FEMARA Take 1 tablet (2.5 mg total) by mouth daily.   loperamide 2 MG capsule Commonly known as:  IMODIUM Take by mouth as needed for diarrhea or loose stools. Reported on 08/19/2015   loratadine 10 MG tablet Commonly known as:  CLARITIN Take 10 mg by mouth every morning. Reported on 08/19/2015   ondansetron 8 MG tablet Commonly known as:  ZOFRAN Take 8 mg by mouth every 8 (eight) hours as needed for nausea or vomiting. Reported on 08/19/2015--- takes prior  to velcade injection at cancer center every other day and during   oxymetazoline 0.05 % nasal spray Commonly known as:  AFRIN Place 1 spray into both nostrils 2 (two) times daily as needed. Reported on 01/29/2015   pomalidomide 1 MG capsule Commonly known as:  POMALYST TAKE 1 CAPSULE (1MG ) BY MOUTH ONCE DAILY FOR 21 DAYS EVERY 28 DAYS. Auth [id number]   potassium chloride SA 20 MEQ tablet Commonly known as:  K-DUR,KLOR-CON TAKE 1 TABLET(20 MEQ) BY MOUTH TWICE DAILY   triamterene-hydrochlorothiazide 37.5-25 MG tablet Commonly known as:  MAXZIDE-25 Take 1 tablet by mouth daily.   Vitamin D3 2000  units Tabs Take 1 tablet by mouth daily.       Allergies:  Allergies  Allergen Reactions  . Codeine Nausea Only    Past Medical History, Surgical history, Social history, and Family History were reviewed and updated.  Review of Systems: All other 10 point review of systems is negative.   Physical Exam:  vitals were not taken for this visit.  Wt Readings from Last 3 Encounters:  02/25/16 193 lb 12.8 oz (87.9 kg)  02/22/16 193 lb (87.5 kg)  01/11/16 187 lb (84.8 kg)    Ocular: Sclerae unicteric, pupils equal, round and reactive to light Ear-nose-throat: Oropharynx clear, dentition fair Lymphatic: No cervical supraclavicular or axillary adenopathy Lungs no rales or rhonchi, good excursion bilaterally Heart regular rate and rhythm, no murmur appreciated Abd soft, nontender, positive bowel sounds, no liver or spleen tip palpated on exam, no fluid wave MSK no focal spinal tenderness, no joint edema Neuro: non-focal, well-oriented, appropriate affect Breasts: Deferred  Lab Results  Component Value Date   WBC 3.7 (L) 03/29/2016   HGB 11.3 (L) 03/29/2016   HCT 33.5 (L) 03/29/2016   MCV 83 03/29/2016   PLT 194 03/29/2016   Lab Results  Component Value Date   FERRITIN 227 12/31/2014   IRON 13 (L) 12/31/2014   TIBC 302 12/31/2014   UIBC 289 12/31/2014   IRONPCTSAT 4 (L) 12/31/2014   Lab Results  Component Value Date   RETICCTPCT 0.6 12/31/2014   RBC 4.06 03/29/2016   Lab Results  Component Value Date   KPAFRELGTCHN 1.67 11/23/2014   LAMBDASER 1.85 11/23/2014   KAPLAMBRATIO 0.59 02/25/2016   Lab Results  Component Value Date   IGGSERUM 879 02/25/2016   IGA 91 12/31/2014   IGMSERUM 34 02/25/2016   Lab Results  Component Value Date   TOTALPROTELP 6.4 11/23/2014   ALBUMINELP 4.0 11/23/2014   A1GS 0.3 11/23/2014   A2GS 0.7 11/23/2014   BETS 0.5 11/23/2014   BETA2SER 0.4 11/23/2014   GAMS 0.7 (L) 11/23/2014   MSPIKE Not Observed 02/25/2016   SPEI *  11/23/2014     Chemistry      Component Value Date/Time   NA 139 03/29/2016 0956   NA 141 03/17/2015 1018   K 3.5 03/29/2016 0956   K 3.2 (L) 03/17/2015 1018   CL 103 03/29/2016 0956   CO2 25 03/29/2016 0956   CO2 25 03/17/2015 1018   BUN 11 03/29/2016 0956   BUN 21.3 03/17/2015 1018   CREATININE 1.0 03/29/2016 0956   CREATININE 1.0 03/17/2015 1018      Component Value Date/Time   CALCIUM 9.2 03/29/2016 0956   CALCIUM 9.6 03/17/2015 1018   ALKPHOS 49 03/29/2016 0956   ALKPHOS 47 03/17/2015 1018   AST 23 03/29/2016 0956   AST 17 03/17/2015 1018   ALT 23 03/29/2016 0956   ALT 14  03/17/2015 1018   BILITOT 0.60 03/29/2016 0956   BILITOT 0.54 03/17/2015 1018      Impression and Plan: Ms. Dante is 66 yo African American female with recurrent lambda light chain myeloma. She ultimately underwent a stem cell transplant in June 2016. So far, her myeloma studies have been stable. She now has localized recurrent endometrial cancer and is being followed by OB/GYN and radiation oncology for this. She completed radiation therapy in March of last year. She continues to do well on Femara and has no complaints at this time.  We will proceed with Velcade today as planned.  She will begin treatment with Xgeva at her next appointment in April.  She will continue on her same schedule with pomalidomide and velcade. We will see her back in 2 weeks for infusion infusion and follow-up in 6 weeks.  She will contact our office with any questions or concerns. We can certainly see her sooner if need be.   Eliezer Bottom, NP 3/14/20189:53 AM

## 2016-04-12 NOTE — Patient Instructions (Signed)
[facility] Discharge Instructions for Patients Receiving Chemotherapy  Today you received the following chemotherapy agents Velcade. To help prevent nausea and vomiting after your treatment, we encourage you to take your nausea medication as directed.  If you develop nausea and vomiting that is not controlled by your nausea medication, call the clinic.   BELOW ARE SYMPTOMS THAT SHOULD BE REPORTED IMMEDIATELY:  *FEVER GREATER THAN 100.5 F  *CHILLS WITH OR WITHOUT FEVER  NAUSEA AND VOMITING THAT IS NOT CONTROLLED WITH YOUR NAUSEA MEDICATION  *UNUSUAL SHORTNESS OF BREATH  *UNUSUAL BRUISING OR BLEEDING  TENDERNESS IN MOUTH AND THROAT WITH OR WITHOUT PRESENCE OF ULCERS  *URINARY PROBLEMS  *BOWEL PROBLEMS  UNUSUAL RASH Items with * indicate a potential emergency and should be followed up as soon as possible.  Feel free to call the clinic you have any questions or concerns. The clinic phone number is [phone].  Please show the CHEMO ALERT CARD at check-in to the Emergency Department and triage nurse.    

## 2016-04-13 LAB — IGG, IGA, IGM
IgA, Qn, Serum: 163 mg/dL (ref 87–352)
IgG, Qn, Serum: 911 mg/dL (ref 700–1600)
IgM, Qn, Serum: 27 mg/dL (ref 26–217)

## 2016-04-13 LAB — KAPPA/LAMBDA LIGHT CHAINS
Ig Kappa Free Light Chain: 14 mg/L (ref 3.3–19.4)
Ig Lambda Free Light Chain: 31.2 mg/L — ABNORMAL HIGH (ref 5.7–26.3)
Kappa/Lambda FluidC Ratio: 0.45 (ref 0.26–1.65)

## 2016-04-14 LAB — PROTEIN ELECTROPHORESIS, SERUM, WITH REFLEX
A/G Ratio: 1.3 (ref 0.7–1.7)
Albumin: 4 g/dL (ref 2.9–4.4)
Alpha 1: 0.2 g/dL (ref 0.0–0.4)
Alpha 2: 0.9 g/dL (ref 0.4–1.0)
Beta: 1.3 g/dL (ref 0.7–1.3)
Gamma Globulin: 0.8 g/dL (ref 0.4–1.8)
Globulin, Total: 3.2 g/dL (ref 2.2–3.9)
Total Protein: 7.2 g/dL (ref 6.0–8.5)

## 2016-04-17 ENCOUNTER — Other Ambulatory Visit: Payer: Self-pay | Admitting: Hematology & Oncology

## 2016-04-17 DIAGNOSIS — C9001 Multiple myeloma in remission: Secondary | ICD-10-CM

## 2016-04-17 DIAGNOSIS — I10 Essential (primary) hypertension: Secondary | ICD-10-CM

## 2016-04-17 DIAGNOSIS — C9000 Multiple myeloma not having achieved remission: Secondary | ICD-10-CM

## 2016-04-17 NOTE — Progress Notes (Signed)
Office Visit:  GYN ONCOLOGY  CC:   Recurrent endometrial cancer , VAIN I   Assessment:   66 y.o.  year old with Stage1A Grade 2 endometrioid endometrial cancer staged 10/2011 with recurrence at the distal vagina identified in April 2014.  Subsequent PET scan was negative for evidence of metastatic disease and Emily Laffey WoodwardCompleted vaginal  brachytherapy 07/29/2012.  Second vaginal recurrence (apex) diagnosed December 2016. Imaging without evidence of  metastatic disease.    Options discussed at multidisciplinary tumor conference and she was treated with conformational radiotherapy   03/10/2015 - 04/20/2015 Continue Femara.  CT C/A/P  07/2015 without metastatic disease   Abnormal Pap Pap 10/2015 ASCUS HPV + Colposcopy with bx vaginal apex 12/2015 c/w VAIN I S/P Co2 laser of the vagina 02/22/2016  Approximately 2 months post laser there are still significant healing changes. I've advised Emily Greer to follow-up with me in May. If these changes are still present will consider biopsy  F/U with Dr. Leo Grosser 09/2016  Bilateral femoral necrosis on imaging Counselled on safe ambulation   IHCC positive testing Declined genetic counseling Consider MSI testing of tumor for consideration of  PDL1 checkpoint inhibitor if there is recurrence   HPI:  Emily Greer is a 66 y.o. year old G3P2 initially seen in consultation on 10/05/2011  grade 1  endometrial cancer  She then underwent a  total abdominal hysterectomy bilateral salpingo-oophorectomy bilateral pelvic lymph node dissection on  28/36/6294 without complications.  Her postoperative course was  uncomplicated.  Her final pathologic diagnosis is a Stage  1A Grade  2 endometrioid endometrial cancer with  negative lymphovascular space invasion,  2/20 (10%) of myometrial invasion and negative lymph nodes.  On 01/2012 visit she reported  post coital vaginal bleeding  that was self limiting. Vaginal biopsy - LARGELY DENUDED SQUAMOUS EPITHELIUM WITH  ASSOCIATED SPONGIOSIS AND CHRONIC INFLAMMATION.- NO DYSPLASIA OR MALIGNANCY  On the visit 04/2012 she c/o vaginal bleeding.  Biopsies were c/w metastatic disease at the distal vagina  PET 06/05/2012 IMPRESSION:  1. There are no specific features identified to suggest  hypermetabolic metastasis from endometrial carcinoma.  2. Skeletal changes of multiple myeloma with focal area of increased uptake   Treated with vaginal brachytherapy June 4, June 11, June 19, June 25, June 30/2014 Site/dose: Vagina, 30.5 Gy in 5 fractions (6 Gy, 6 Gy, 6 Gy, 6 Gy, 6.5 Gy)  Pap 08/17/2013 ASCUS HPV neg  She was seen by Dr Leo Grosser on 01/21/15 at which time a friable lesion was noted at the vaginal cuff. This was biopsied and was consistent with endometrioid adenocarcinoma (recurrence).  CT C/A/P 02/15/2015 without evidence of metastatic disease.  Care complicated by successful transplant 2016 for Rx multiple myeloma. Received conformational radiotherapy completed 03/2015 and then started on femara  CT C/A/P 07/26/2015 - PET declined by insurance- 1. Stable examinations demonstrating no evidence of metastatic endometrial carcinoma. 2. Stable widespread osseous findings attributed to multiple myeloma. No evidence of pathologic fracture or epidural tumor. 3. Grossly stable small left thyroid nodule. 4. Chronic bilateral femoral head avascular necrosis.  Interval history: Pap collected by Dr. Leo Grosser in October 2017 ASCUS positive for high risk HPV virus.   Colposcopy 12/30/2015  c/w  Vagina, biopsy, vaginal apex - LOW GRADE SQUAMOUS INTRAEPITHELIAL LESION, CIN-I (MILD DYSPLASIA). - NO MALIGNANCY IDENTIFIED.  S/P Co2 laser of the vagina 02/22/2016  Interval history: Reports bleeding from the vagina with episodes of constipation Past Medical History:  Diagnosis Date  . Avascular necrosis of  femoral head ([place])    bilateral per CT 07-26-2015  . Endometrial carcinoma [facility]) gyn oncologist-  dr Skeet Latch ([facility])/  radiation oncologist-- dr Sondra Come   2013 dx  FIGO Stage 1A, Grade 2 endometrioid endometrial cancer s/p TAH w/ BSO and bilateral pelvic node dissection 10-31-2011 ;  recurrence at distal vagina 04/ 2014 s/p  brachytherapy (ended 07-29-2012);  2nd recurrence 12/ 2016  vaginal apex s/p  conformational radiotherapy 03-10-2015 to 04-20-2015  . GERD (gastroesophageal reflux disease)   . H/O stem cell transplant ([place])    02/ 2000 and second one 06/ 2016  . 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 History of radiation therapy 6/4, 6/11, 6/19, 6/25, 07/29/2012   vagina 30.5 gray in 5 fx, HDR brachytherapy:   last radiation to vagina 03-10-2015 to 04-20-2015  50.4gray  . Hypertension   . Lambda light chain myeloma [facility]) oncologist-  dr Marin Olp ([facility])  and  [facility] -- dr Salena Saner gasparetto   dx 07/ 1999 s/p  VAD chemotherapy 11/ 1999,  purged autotransplant 03-21-1998 followed by auto stem cell transplant 03-29-1998;  recurrance w/ second autologous stem cell transplant 07-24-2014;  in Re-mission currently , chemo maintenance therapy  . Osteoporosis 12/18/05   Increased  risk   . PONV (postoperative nausea and vomiting)   . Wears glasses      Past Surgical History:  Procedure Laterality Date  . ABDOMINAL HYSTERECTOMY  10/31/2011   Procedure: HYSTERECTOMY ABDOMINAL;  Surgeon: [name], MD PHD;  Location: [facility];  Service: Gynecology;  Laterality: N/A;  . CO2 LASER APPLICATION N/A 2/54/2706   Procedure: CO2 LASER APPLICATION;  Surgeon: [name], MD;  Location: [facility];  Service: Gynecology;  Laterality: N/A;  . ECTOPIC PREGNANCY SURGERY  1992  . HYSTEROSCOPY W/D&C  09/27/2011   Procedure: DILATATION AND CURETTAGE /HYSTEROSCOPY;  Surgeon: Eldred Manges, MD;  Location: [place] [facility];  Service: Gynecology;;  . LAPAROTOMY  10/31/2011   Procedure: EXPLORATORY LAPAROTOMY;  Surgeon: [name], MD PHD;  Location: [facility];  Service: Gynecology;  Laterality: N/A;  . SALPINGOOPHORECTOMY  10/31/2011   Procedure: SALPINGO OOPHERECTOMY;  Surgeon: [name], MD PHD;  Location: [facility];  Service: Gynecology;  Laterality: Bilateral;   Lymph Nodes sampling  . 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 Just moved to a new home.    Review of systems: Constitutional:  She has no fever or chills. No changes in weight.  Cardiovascular: No chest pain, palpitations or edema. Respiratory:  No shortness of breath, wheezing or cough Gastrointestinal: She has normal bowel movements without diarrhea or constipation. She denies any nausea or vomiting.   Genitourinary:  She denies pelvic pain, pelvic pressure or changes in her urinary function. + vaginal bleeding (light) with constipation Otherwise uninformative 10 point review of system   Physical Exam: There were no vitals taken for this visit. Wt Readings from Last 3 Encounters:  04/12/16 193 lb 1.9 oz (87.6 kg)  02/25/16 193 lb 12.8 oz (87.9 kg)  02/22/16 193 lb (87.5 kg)   General: Well dressed, well nourished in no apparent distress.   Abdomen:  Soft, nontender, nondistended.  No palpable masses.  No hepatosplenomegaly.  No ascites. Normal bowel sounds.  No hernias.   Genitourinary: Normal EGBUS radiation changes at the vaginal apex.  Vagina foreshortened.  Changes consistent with laser CO2  ablation. Rectal:  Deferred.  Back: No CVA tenderness  LN:  No cervical supraclavicular or inguinal adenopathy

## 2016-04-18 ENCOUNTER — Ambulatory Visit: Payer: BC Managed Care – PPO | Admitting: Gynecologic Oncology

## 2016-04-18 ENCOUNTER — Ambulatory Visit: Payer: BC Managed Care – PPO | Attending: Gynecologic Oncology | Admitting: Gynecologic Oncology

## 2016-04-18 ENCOUNTER — Encounter: Payer: Self-pay | Admitting: Gynecologic Oncology

## 2016-04-18 VITALS — BP 149/84 | HR 77 | Temp 98.4°F | Resp 20 | Wt 194.3 lb

## 2016-04-18 DIAGNOSIS — Z923 Personal history of irradiation: Secondary | ICD-10-CM | POA: Insufficient documentation

## 2016-04-18 DIAGNOSIS — M879 Osteonecrosis, unspecified: Secondary | ICD-10-CM | POA: Insufficient documentation

## 2016-04-18 DIAGNOSIS — K219 Gastro-esophageal reflux disease without esophagitis: Secondary | ICD-10-CM | POA: Insufficient documentation

## 2016-04-18 DIAGNOSIS — I10 Essential (primary) hypertension: Secondary | ICD-10-CM | POA: Insufficient documentation

## 2016-04-18 DIAGNOSIS — C541 Malignant neoplasm of endometrium: Secondary | ICD-10-CM | POA: Insufficient documentation

## 2016-04-18 DIAGNOSIS — Z90722 Acquired absence of ovaries, bilateral: Secondary | ICD-10-CM | POA: Diagnosis not present

## 2016-04-18 DIAGNOSIS — N890 Mild vaginal dysplasia: Secondary | ICD-10-CM | POA: Diagnosis present

## 2016-04-18 DIAGNOSIS — Z90710 Acquired absence of both cervix and uterus: Secondary | ICD-10-CM | POA: Insufficient documentation

## 2016-04-18 DIAGNOSIS — Z9079 Acquired absence of other genital organ(s): Secondary | ICD-10-CM | POA: Insufficient documentation

## 2016-04-18 DIAGNOSIS — Z79811 Long term (current) use of aromatase inhibitors: Secondary | ICD-10-CM

## 2016-04-18 DIAGNOSIS — N893 Dysplasia of vagina, unspecified: Secondary | ICD-10-CM

## 2016-04-18 NOTE — Patient Instructions (Signed)
Follow up with Dr. Skeet Latch on Jun 08, 2016 at 2:30 pm. Arrive at 2:15 to register.

## 2016-04-20 ENCOUNTER — Ambulatory Visit: Payer: BC Managed Care – PPO

## 2016-04-21 ENCOUNTER — Ambulatory Visit: Payer: BC Managed Care – PPO

## 2016-04-21 ENCOUNTER — Other Ambulatory Visit: Payer: BC Managed Care – PPO

## 2016-04-24 ENCOUNTER — Other Ambulatory Visit: Payer: Self-pay | Admitting: Hematology & Oncology

## 2016-04-24 LAB — UIFE/LIGHT CHAINS/TP QN, 24-HR UR
FR KAPPA LT CH,24HR: 40 mg/24 hr
FR LAMBDA LT CH,24HR: 30 mg/24 hr
Free Kappa Lt Chains,Ur: 17.5 mg/L (ref 1.35–24.19)
Free Lambda Lt Chains,Ur: 12.9 mg/L — ABNORMAL HIGH (ref 0.24–6.66)
Kappa/Lambda Ratio,U: 1.36 — ABNORMAL LOW (ref 2.04–10.37)
PROTEIN,TOTAL,URINE: 5 mg/dL
Prot,24hr calculated: 115 mg/24 hr (ref 30–150)

## 2016-04-25 ENCOUNTER — Other Ambulatory Visit: Payer: Self-pay | Admitting: *Deleted

## 2016-04-25 DIAGNOSIS — C541 Malignant neoplasm of endometrium: Secondary | ICD-10-CM

## 2016-04-25 DIAGNOSIS — C7982 Secondary malignant neoplasm of genital organs: Principal | ICD-10-CM

## 2016-04-25 MED ORDER — POMALIDOMIDE 1 MG PO CAPS: ORAL_CAPSULE | ORAL | 0 refills | Status: DC

## 2016-04-26 ENCOUNTER — Other Ambulatory Visit: Payer: BC Managed Care – PPO

## 2016-04-26 ENCOUNTER — Ambulatory Visit: Payer: BC Managed Care – PPO

## 2016-04-27 ENCOUNTER — Ambulatory Visit (HOSPITAL_BASED_OUTPATIENT_CLINIC_OR_DEPARTMENT_OTHER): Payer: BC Managed Care – PPO

## 2016-04-27 ENCOUNTER — Other Ambulatory Visit: Payer: Self-pay | Admitting: *Deleted

## 2016-04-27 ENCOUNTER — Other Ambulatory Visit (HOSPITAL_BASED_OUTPATIENT_CLINIC_OR_DEPARTMENT_OTHER): Payer: BC Managed Care – PPO

## 2016-04-27 VITALS — BP 139/66 | HR 92 | Temp 97.8°F | Resp 18

## 2016-04-27 DIAGNOSIS — Z5112 Encounter for antineoplastic immunotherapy: Secondary | ICD-10-CM

## 2016-04-27 DIAGNOSIS — C541 Malignant neoplasm of endometrium: Secondary | ICD-10-CM | POA: Diagnosis not present

## 2016-04-27 DIAGNOSIS — C9000 Multiple myeloma not having achieved remission: Secondary | ICD-10-CM

## 2016-04-27 DIAGNOSIS — C7982 Secondary malignant neoplasm of genital organs: Principal | ICD-10-CM

## 2016-04-27 LAB — CBC WITH DIFFERENTIAL (CANCER CENTER ONLY)
BASO#: 0 10*3/uL (ref 0.0–0.2)
BASO%: 0.9 % (ref 0.0–2.0)
EOS%: 5.7 % (ref 0.0–7.0)
Eosinophils Absolute: 0.3 10*3/uL (ref 0.0–0.5)
HCT: 32.1 % — ABNORMAL LOW (ref 34.8–46.6)
HGB: 10.8 g/dL — ABNORMAL LOW (ref 11.6–15.9)
LYMPH#: 1.4 10*3/uL (ref 0.9–3.3)
LYMPH%: 33 % (ref 14.0–48.0)
MCH: 27.5 pg (ref 26.0–34.0)
MCHC: 33.6 g/dL (ref 32.0–36.0)
MCV: 82 fL (ref 81–101)
MONO#: 0.7 10*3/uL (ref 0.1–0.9)
MONO%: 14.9 % — ABNORMAL HIGH (ref 0.0–13.0)
NEUT#: 2 10*3/uL (ref 1.5–6.5)
NEUT%: 45.5 % (ref 39.6–80.0)
Platelets: 235 10*3/uL (ref 145–400)
RBC: 3.93 10*6/uL (ref 3.70–5.32)
RDW: 15.7 % (ref 11.1–15.7)
WBC: 4.4 10*3/uL (ref 3.9–10.0)

## 2016-04-27 LAB — CMP (CANCER CENTER ONLY)
ALT(SGPT): 32 U/L (ref 10–47)
AST: 26 U/L (ref 11–38)
Albumin: 3.7 g/dL (ref 3.3–5.5)
Alkaline Phosphatase: 62 U/L (ref 26–84)
BUN, Bld: 22 mg/dL (ref 7–22)
CO2: 24 mEq/L (ref 18–33)
Calcium: 9.7 mg/dL (ref 8.0–10.3)
Chloride: 104 mEq/L (ref 98–108)
Creat: 1.3 mg/dl — ABNORMAL HIGH (ref 0.6–1.2)
Glucose, Bld: 89 mg/dL (ref 73–118)
Potassium: 3.1 mEq/L — ABNORMAL LOW (ref 3.3–4.7)
Sodium: 142 mEq/L (ref 128–145)
Total Bilirubin: 0.5 mg/dl (ref 0.20–1.60)
Total Protein: 7.6 g/dL (ref 6.4–8.1)

## 2016-04-27 MED ORDER — ONDANSETRON HCL 8 MG PO TABS
ORAL_TABLET | ORAL | Status: AC
  Filled 2016-04-27: qty 1

## 2016-04-27 MED ORDER — ONDANSETRON HCL 8 MG PO TABS
8.0000 mg | ORAL_TABLET | Freq: Once | ORAL | Status: AC
  Administered 2016-04-27: 8 mg via ORAL

## 2016-04-27 MED ORDER — ACYCLOVIR 400 MG PO TABS: 400.0000 mg | ORAL_TABLET | Freq: Two times a day (BID) | ORAL | 3 refills | Status: DC

## 2016-04-27 MED ORDER — BORTEZOMIB CHEMO SQ INJECTION 3.5 MG (2.5MG/ML)
1.2000 mg/m2 | Freq: Once | INTRAMUSCULAR | Status: AC
  Administered 2016-04-27: 2.5 mg via SUBCUTANEOUS
  Filled 2016-04-27: qty 2.5

## 2016-04-27 NOTE — Patient Instructions (Signed)
Bortezomib injection What is this medicine? BORTEZOMIB (bor TEZ oh mib) is a medicine that targets proteins in cancer cells and stops the cancer cells from growing. It is used to treat multiple myeloma and mantle-cell lymphoma. This medicine may be used for other purposes; ask your health care provider or pharmacist if you have questions. What should I tell my health care provider before I take this medicine? They need to know if you have any of these conditions: -diabetes -heart disease -irregular heartbeat -liver disease -on hemodialysis -low blood counts, like low white blood cells, platelets, or hemoglobin -peripheral neuropathy -taking medicine for blood pressure -an unusual or allergic reaction to bortezomib, mannitol, boron, other medicines, foods, dyes, or preservatives -pregnant or trying to get pregnant -breast-feeding How should I use this medicine? This medicine is for injection into a vein or for injection under the skin. It is given by a health care professional in a hospital or clinic setting. Talk to your pediatrician regarding the use of this medicine in children. Special care may be needed. Overdosage: If you think you have taken too much of this medicine contact a poison control center or emergency room at once. NOTE: This medicine is only for you. Do not share this medicine with others. What if I miss a dose? It is important not to miss your dose. Call your doctor or health care professional if you are unable to keep an appointment. What may interact with this medicine? This medicine may interact with the following medications: -ketoconazole -rifampin -ritonavir -St. John's Wort This list may not describe all possible interactions. Give your health care provider a list of all the medicines, herbs, non-prescription drugs, or dietary supplements you use. Also tell them if you smoke, drink alcohol, or use illegal drugs. Some items may interact with your medicine. What  should I watch for while using this medicine? Visit your doctor for checks on your progress. This drug may make you feel generally unwell. This is not uncommon, as chemotherapy can affect healthy cells as well as cancer cells. Report any side effects. Continue your course of treatment even though you feel ill unless your doctor tells you to stop. You may get drowsy or dizzy. Do not drive, use machinery, or do anything that needs mental alertness until you know how this medicine affects you. Do not stand or sit up quickly, especially if you are an older patient. This reduces the risk of dizzy or fainting spells. In some cases, you may be given additional medicines to help with side effects. Follow all directions for their use. Call your doctor or health care professional for advice if you get a fever, chills or sore throat, or other symptoms of a cold or flu. Do not treat yourself. This drug decreases your body's ability to fight infections. Try to avoid being around people who are sick. This medicine may increase your risk to bruise or bleed. Call your doctor or health care professional if you notice any unusual bleeding. You may need blood work done while you are taking this medicine. In some patients, this medicine may cause a serious brain infection that may cause death. If you have any problems seeing, thinking, speaking, walking, or standing, tell your doctor right away. If you cannot reach your doctor, urgently seek other source of medical care. Do not become pregnant while taking this medicine. Women should inform their doctor if they wish to become pregnant or think they might be pregnant. There is a potential for serious  side effects to an unborn child. Talk to your health care professional or pharmacist for more information. Do not breast-feed an infant while taking this medicine. Check with your doctor or health care professional if you get an attack of severe diarrhea, nausea and vomiting, or if  you sweat a lot. The loss of too much body fluid can make it dangerous for you to take this medicine. What side effects may I notice from receiving this medicine? Side effects that you should report to your doctor or health care professional as soon as possible: -allergic reactions like skin rash, itching or hives, swelling of the face, lips, or tongue -breathing problems -changes in hearing -changes in vision -fast, irregular heartbeat -feeling faint or lightheaded, falls -pain, tingling, numbness in the hands or feet -right upper belly pain -seizures -swelling of the ankles, feet, hands -unusual bleeding or bruising -unusually weak or tired -vomiting -yellowing of the eyes or skin Side effects that usually do not require medical attention (report to your doctor or health care professional if they continue or are bothersome): -changes in emotions or moods -constipation -diarrhea -loss of appetite -headache -irritation at site where injected -nausea This list may not describe all possible side effects. Call your doctor for medical advice about side effects. You may report side effects to FDA at 1-800-FDA-1088. Where should I keep my medicine? This drug is given in a hospital or clinic and will not be stored at home. NOTE: This sheet is a summary. It may not cover all possible information. If you have questions about this medicine, talk to your doctor, pharmacist, or health care provider.    2016, Elsevier/Gold Standard. (2014-03-17 14:47:04)

## 2016-04-29 ENCOUNTER — Other Ambulatory Visit: Payer: Self-pay | Admitting: Hematology & Oncology

## 2016-05-05 ENCOUNTER — Other Ambulatory Visit: Payer: BC Managed Care – PPO

## 2016-05-05 ENCOUNTER — Ambulatory Visit: Payer: BC Managed Care – PPO

## 2016-05-09 ENCOUNTER — Other Ambulatory Visit: Payer: Self-pay | Admitting: *Deleted

## 2016-05-10 ENCOUNTER — Other Ambulatory Visit (HOSPITAL_BASED_OUTPATIENT_CLINIC_OR_DEPARTMENT_OTHER): Payer: BC Managed Care – PPO

## 2016-05-10 ENCOUNTER — Ambulatory Visit (HOSPITAL_BASED_OUTPATIENT_CLINIC_OR_DEPARTMENT_OTHER): Payer: BC Managed Care – PPO

## 2016-05-10 VITALS — BP 142/70 | HR 75 | Temp 98.0°F | Resp 17

## 2016-05-10 DIAGNOSIS — C9000 Multiple myeloma not having achieved remission: Secondary | ICD-10-CM | POA: Diagnosis not present

## 2016-05-10 DIAGNOSIS — Z5112 Encounter for antineoplastic immunotherapy: Secondary | ICD-10-CM | POA: Diagnosis not present

## 2016-05-10 DIAGNOSIS — C7982 Secondary malignant neoplasm of genital organs: Secondary | ICD-10-CM

## 2016-05-10 DIAGNOSIS — C9001 Multiple myeloma in remission: Secondary | ICD-10-CM

## 2016-05-10 DIAGNOSIS — C541 Malignant neoplasm of endometrium: Secondary | ICD-10-CM

## 2016-05-10 LAB — CMP (CANCER CENTER ONLY)
ALT(SGPT): 21 U/L (ref 10–47)
AST: 19 U/L (ref 11–38)
Albumin: 3.5 g/dL (ref 3.3–5.5)
Alkaline Phosphatase: 54 U/L (ref 26–84)
BUN, Bld: 19 mg/dL (ref 7–22)
CO2: 26 mEq/L (ref 18–33)
Calcium: 9.4 mg/dL (ref 8.0–10.3)
Chloride: 105 mEq/L (ref 98–108)
Creat: 1 mg/dl (ref 0.6–1.2)
Glucose, Bld: 91 mg/dL (ref 73–118)
Potassium: 3.6 mEq/L (ref 3.3–4.7)
Sodium: 139 mEq/L (ref 128–145)
Total Bilirubin: 0.5 mg/dl (ref 0.20–1.60)
Total Protein: 7.2 g/dL (ref 6.4–8.1)

## 2016-05-10 LAB — CBC WITH DIFFERENTIAL (CANCER CENTER ONLY)
BASO#: 0.1 10*3/uL (ref 0.0–0.2)
BASO%: 1.4 % (ref 0.0–2.0)
EOS%: 1.4 % (ref 0.0–7.0)
Eosinophils Absolute: 0.1 10*3/uL (ref 0.0–0.5)
HCT: 33.5 % — ABNORMAL LOW (ref 34.8–46.6)
HGB: 11 g/dL — ABNORMAL LOW (ref 11.6–15.9)
LYMPH#: 1.7 10*3/uL (ref 0.9–3.3)
LYMPH%: 37.2 % (ref 14.0–48.0)
MCH: 27.4 pg (ref 26.0–34.0)
MCHC: 32.8 g/dL (ref 32.0–36.0)
MCV: 83 fL (ref 81–101)
MONO#: 0.4 10*3/uL (ref 0.1–0.9)
MONO%: 9.9 % (ref 0.0–13.0)
NEUT#: 2.2 10*3/uL (ref 1.5–6.5)
NEUT%: 50.1 % (ref 39.6–80.0)
Platelets: 237 10*3/uL (ref 145–400)
RBC: 4.02 10*6/uL (ref 3.70–5.32)
RDW: 16.6 % — ABNORMAL HIGH (ref 11.1–15.7)
WBC: 4.4 10*3/uL (ref 3.9–10.0)

## 2016-05-10 MED ORDER — BORTEZOMIB CHEMO SQ INJECTION 3.5 MG (2.5MG/ML)
1.2000 mg/m2 | Freq: Once | INTRAMUSCULAR | Status: AC
  Administered 2016-05-10: 2.5 mg via SUBCUTANEOUS
  Filled 2016-05-10: qty 2.5

## 2016-05-10 MED ORDER — ONDANSETRON HCL 8 MG PO TABS
ORAL_TABLET | ORAL | Status: AC
  Filled 2016-05-10: qty 1

## 2016-05-10 MED ORDER — ONDANSETRON HCL 8 MG PO TABS
8.0000 mg | ORAL_TABLET | Freq: Once | ORAL | Status: AC
  Administered 2016-05-10: 8 mg via ORAL

## 2016-05-10 NOTE — Patient Instructions (Signed)
[facility] Discharge Instructions for Patients Receiving Chemotherapy  Today you received the following chemotherapy agents Velcade. To help prevent nausea and vomiting after your treatment, we encourage you to take your nausea medication as directed.  If you develop nausea and vomiting that is not controlled by your nausea medication, call the clinic.   BELOW ARE SYMPTOMS THAT SHOULD BE REPORTED IMMEDIATELY:  *FEVER GREATER THAN 100.5 F  *CHILLS WITH OR WITHOUT FEVER  NAUSEA AND VOMITING THAT IS NOT CONTROLLED WITH YOUR NAUSEA MEDICATION  *UNUSUAL SHORTNESS OF BREATH  *UNUSUAL BRUISING OR BLEEDING  TENDERNESS IN MOUTH AND THROAT WITH OR WITHOUT PRESENCE OF ULCERS  *URINARY PROBLEMS  *BOWEL PROBLEMS  UNUSUAL RASH Items with * indicate a potential emergency and should be followed up as soon as possible.  Feel free to call the clinic you have any questions or concerns. The clinic phone number is [phone].  Please show the CHEMO ALERT CARD at check-in to the Emergency Department and triage nurse.    

## 2016-05-11 LAB — KAPPA/LAMBDA LIGHT CHAINS
Ig Kappa Free Light Chain: 13.3 mg/L (ref 3.3–19.4)
Ig Lambda Free Light Chain: 42 mg/L — ABNORMAL HIGH (ref 5.7–26.3)
Kappa/Lambda FluidC Ratio: 0.32 (ref 0.26–1.65)

## 2016-05-12 LAB — MULTIPLE MYELOMA PANEL, SERUM
Albumin SerPl Elph-Mcnc: 4 g/dL (ref 2.9–4.4)
Albumin/Glob SerPl: 1.4 (ref 0.7–1.7)
Alpha 1: 0.2 g/dL (ref 0.0–0.4)
Alpha2 Glob SerPl Elph-Mcnc: 0.8 g/dL (ref 0.4–1.0)
B-Globulin SerPl Elph-Mcnc: 1.2 g/dL (ref 0.7–1.3)
Gamma Glob SerPl Elph-Mcnc: 0.8 g/dL (ref 0.4–1.8)
Globulin, Total: 2.9 g/dL (ref 2.2–3.9)
IgA, Qn, Serum: 172 mg/dL (ref 87–352)
IgG, Qn, Serum: 998 mg/dL (ref 700–1600)
IgM, Qn, Serum: 25 mg/dL — ABNORMAL LOW (ref 26–217)
Total Protein: 6.9 g/dL (ref 6.0–8.5)

## 2016-05-19 ENCOUNTER — Other Ambulatory Visit: Payer: BC Managed Care – PPO

## 2016-05-19 ENCOUNTER — Ambulatory Visit: Payer: BC Managed Care – PPO | Admitting: Family

## 2016-05-19 ENCOUNTER — Ambulatory Visit: Payer: BC Managed Care – PPO

## 2016-05-23 ENCOUNTER — Other Ambulatory Visit: Payer: Self-pay | Admitting: *Deleted

## 2016-05-23 ENCOUNTER — Encounter: Payer: Self-pay | Admitting: Oncology

## 2016-05-23 DIAGNOSIS — C9001 Multiple myeloma in remission: Secondary | ICD-10-CM

## 2016-05-24 ENCOUNTER — Ambulatory Visit (HOSPITAL_BASED_OUTPATIENT_CLINIC_OR_DEPARTMENT_OTHER): Payer: BC Managed Care – PPO

## 2016-05-24 ENCOUNTER — Other Ambulatory Visit (HOSPITAL_BASED_OUTPATIENT_CLINIC_OR_DEPARTMENT_OTHER): Payer: BC Managed Care – PPO

## 2016-05-24 ENCOUNTER — Ambulatory Visit (HOSPITAL_BASED_OUTPATIENT_CLINIC_OR_DEPARTMENT_OTHER): Payer: BC Managed Care – PPO | Admitting: Hematology & Oncology

## 2016-05-24 VITALS — BP 128/74 | HR 76 | Temp 98.3°F | Resp 19 | Wt 195.1 lb

## 2016-05-24 DIAGNOSIS — C9001 Multiple myeloma in remission: Secondary | ICD-10-CM

## 2016-05-24 DIAGNOSIS — C541 Malignant neoplasm of endometrium: Secondary | ICD-10-CM

## 2016-05-24 DIAGNOSIS — Z79811 Long term (current) use of aromatase inhibitors: Secondary | ICD-10-CM | POA: Diagnosis not present

## 2016-05-24 DIAGNOSIS — C9000 Multiple myeloma not having achieved remission: Secondary | ICD-10-CM

## 2016-05-24 DIAGNOSIS — Z5112 Encounter for antineoplastic immunotherapy: Secondary | ICD-10-CM | POA: Diagnosis not present

## 2016-05-24 LAB — CMP (CANCER CENTER ONLY)
ALT(SGPT): 20 U/L (ref 10–47)
AST: 22 U/L (ref 11–38)
Albumin: 3.6 g/dL (ref 3.3–5.5)
Alkaline Phosphatase: 57 U/L (ref 26–84)
BUN, Bld: 14 mg/dL (ref 7–22)
CO2: 25 mEq/L (ref 18–33)
Calcium: 9.4 mg/dL (ref 8.0–10.3)
Chloride: 104 mEq/L (ref 98–108)
Creat: 1 mg/dl (ref 0.6–1.2)
Glucose, Bld: 88 mg/dL (ref 73–118)
Potassium: 3.6 mEq/L (ref 3.3–4.7)
Sodium: 138 mEq/L (ref 128–145)
Total Bilirubin: 0.6 mg/dl (ref 0.20–1.60)
Total Protein: 7.2 g/dL (ref 6.4–8.1)

## 2016-05-24 LAB — CBC WITH DIFFERENTIAL (CANCER CENTER ONLY)
BASO#: 0.1 10*3/uL (ref 0.0–0.2)
BASO%: 1.5 % (ref 0.0–2.0)
EOS%: 3.5 % (ref 0.0–7.0)
Eosinophils Absolute: 0.2 10*3/uL (ref 0.0–0.5)
HCT: 33.5 % — ABNORMAL LOW (ref 34.8–46.6)
HGB: 11 g/dL — ABNORMAL LOW (ref 11.6–15.9)
LYMPH#: 1.4 10*3/uL (ref 0.9–3.3)
LYMPH%: 30.2 % (ref 14.0–48.0)
MCH: 27.2 pg (ref 26.0–34.0)
MCHC: 32.8 g/dL (ref 32.0–36.0)
MCV: 83 fL (ref 81–101)
MONO#: 0.6 10*3/uL (ref 0.1–0.9)
MONO%: 12.5 % (ref 0.0–13.0)
NEUT#: 2.4 10*3/uL (ref 1.5–6.5)
NEUT%: 52.3 % (ref 39.6–80.0)
Platelets: 232 10*3/uL (ref 145–400)
RBC: 4.04 10*6/uL (ref 3.70–5.32)
RDW: 17 % — ABNORMAL HIGH (ref 11.1–15.7)
WBC: 4.6 10*3/uL (ref 3.9–10.0)

## 2016-05-24 MED ORDER — BORTEZOMIB CHEMO SQ INJECTION 3.5 MG (2.5MG/ML)
1.2000 mg/m2 | Freq: Once | INTRAMUSCULAR | Status: AC
  Administered 2016-05-24: 2.5 mg via SUBCUTANEOUS
  Filled 2016-05-24: qty 2.5

## 2016-05-24 MED ORDER — ONDANSETRON HCL 8 MG PO TABS
ORAL_TABLET | ORAL | Status: AC
  Filled 2016-05-24: qty 1

## 2016-05-24 MED ORDER — ONDANSETRON HCL 8 MG PO TABS
8.0000 mg | ORAL_TABLET | Freq: Once | ORAL | Status: AC
  Administered 2016-05-24: 8 mg via ORAL

## 2016-05-24 MED ORDER — DENOSUMAB 120 MG/1.7ML ~~LOC~~ SOLN
120.0000 mg | Freq: Once | SUBCUTANEOUS | Status: AC
  Administered 2016-05-24: 120 mg via SUBCUTANEOUS
  Filled 2016-05-24: qty 1.7

## 2016-05-24 NOTE — Progress Notes (Signed)
Hematology and Oncology Follow Up Visit  Emily Greer [id number] May 27, 1950 66 y.o. 05/24/2016   Principle Diagnosis:   Recurrent lambda light chain myeloma  History of recurrent endometrial carcinoma  Current Therapy:    Status post second autologous stem cell transplant on 07/24/2014  Maintenance therapy with Pomalidomide/every 2 week Velcade  Xgeva 120 mg subcutaneous every 3 months-start in April 2018  Radiation therapy for endometrial recurrence - completed 04/20/2015  Femara 2.5mg  po q day     Interim History:  [name] is back for followup. She is doing pretty well. She has had no specific complaints. She and her husband are still moving into their house. They have been busy traveling to visit family. There have elderly parents in [place].  She is doing well on the Pomalidomide/Velcade combination. She really had no toxicity from this.  Her last lambda light chain in April was 4.2 mg/dL. This is been creeping up slowly.  She's had no fever. She's had no bleeding. She does have a history of endometrial cancer. She had recurrence locally that was treated with radiation therapy a year ago. So far, there has been no problems with recurrent disease.  She has had no rashes. She's had a change in her blood pressure medications. This has helped her with leg cramps.  There's been no cough. She's had no obvious change in bowel or bladder habits.  Overall, her performance status is ECOG 1.  Medications: Allergies:  Allergies  Allergen Reactions  . Codeine Nausea Only    Past Medical History, Surgical history, Social history, and Family History were reviewed and updated.  Review of Systems: As above  Physical Exam:  weight is 195 lb 1.9 oz (88.5 kg). Her oral temperature is 98.3 F (36.8 C). Her blood pressure is 128/74 and her pulse is 76. Her respiration is 19 and oxygen saturation is 100%.   Well-developed and well-nourished [name].  Head and neck exam shows no ocular or oral lesion. There are no palpable cervical or supraclavicular lymph nodes. Lungs are clear. Cardiac exam regular rate and rhythm with no murmurs rubs or bruits. Abdomen is soft. She has good bowel sounds. There is no fluid wave. There is no palpable liver or spleen tip. Extremities shows no clubbing, cyanosis or edema. Neurological exam shows no focal neurological deficits. Skin exam no rashes, ecchymosis or petechia.  Lab Results  Component Value Date   WBC 4.6 05/24/2016   HGB 11.0 (L) 05/24/2016   HCT 33.5 (L) 05/24/2016   MCV 83 05/24/2016   PLT 232 05/24/2016     Chemistry      Component Value Date/Time   NA 138 05/24/2016 0847   NA 141 03/17/2015 1018   K 3.6 05/24/2016 0847   K 3.2 (L) 03/17/2015 1018   CL 104 05/24/2016 0847   CO2 25 05/24/2016 0847   CO2 25 03/17/2015 1018   BUN 14 05/24/2016 0847   BUN 21.3 03/17/2015 1018   CREATININE 1.0 05/24/2016 0847   CREATININE 1.0 03/17/2015 1018      Component Value Date/Time   CALCIUM 9.4 05/24/2016 0847   CALCIUM 9.6 03/17/2015 1018   ALKPHOS 57 05/24/2016 0847   ALKPHOS 47 03/17/2015 1018   AST 22 05/24/2016 0847   AST 17 03/17/2015 1018   ALT 20 05/24/2016 0847   ALT 14 03/17/2015 1018   BILITOT 0.60 05/24/2016 0847   BILITOT 0.54 03/17/2015 1018         Impression and  Plan: Emily Greer is 66 year old female. She has recurrent lambda light chain myeloma. She also had recurrent endometrial cancer. The recurrence was localized. She is being followed by OB/GYN and radiation oncology for this. She completed radiation therapy back in March. She had 50.4 rad for this.   We will go ahead with Velcade today. We do this every 2 weeks.  I will use Xgeva now. I then this would be very helpful for her. She could never tolerate Zometa. We had her on Aredia which she had to be here for 4 hours. The subcutaneous injection every 3 months I think would be very well tolerated.   We will  continue the Velcade every 2 weeks.  I think we probably get her back in 6 weeks now.  [name], MD 4/25/201811:55 AM

## 2016-05-24 NOTE — Patient Instructions (Signed)

## 2016-05-25 ENCOUNTER — Telehealth: Payer: Self-pay | Admitting: Oncology

## 2016-05-25 ENCOUNTER — Other Ambulatory Visit: Payer: Self-pay | Admitting: *Deleted

## 2016-05-25 ENCOUNTER — Ambulatory Visit: Payer: BC Managed Care – PPO | Admitting: Radiation Oncology

## 2016-05-25 ENCOUNTER — Other Ambulatory Visit: Payer: Self-pay | Admitting: Hematology & Oncology

## 2016-05-25 MED ORDER — POMALIDOMIDE 1 MG PO CAPS: ORAL_CAPSULE | ORAL | 6 refills | Status: DC

## 2016-05-25 NOTE — Telephone Encounter (Signed)
Patient called to cancel her follow up appointment with Dr. Sondra Come today because she has had an ablation and has not been released by Dr. Skeet Latch yet.  She will see Dr. Skeet Latch again 06/08/16.  Advised her to call after this appointment to reschedule with Dr. Sondra Come.

## 2016-06-02 ENCOUNTER — Other Ambulatory Visit: Payer: BC Managed Care – PPO

## 2016-06-02 ENCOUNTER — Ambulatory Visit: Payer: BC Managed Care – PPO

## 2016-06-06 ENCOUNTER — Other Ambulatory Visit: Payer: Self-pay | Admitting: *Deleted

## 2016-06-06 DIAGNOSIS — C9001 Multiple myeloma in remission: Secondary | ICD-10-CM

## 2016-06-07 ENCOUNTER — Other Ambulatory Visit (HOSPITAL_BASED_OUTPATIENT_CLINIC_OR_DEPARTMENT_OTHER): Payer: BC Managed Care – PPO

## 2016-06-07 ENCOUNTER — Ambulatory Visit (HOSPITAL_BASED_OUTPATIENT_CLINIC_OR_DEPARTMENT_OTHER): Payer: BC Managed Care – PPO

## 2016-06-07 VITALS — BP 140/77 | HR 85 | Temp 98.0°F | Resp 18

## 2016-06-07 DIAGNOSIS — Z5112 Encounter for antineoplastic immunotherapy: Secondary | ICD-10-CM

## 2016-06-07 DIAGNOSIS — C9000 Multiple myeloma not having achieved remission: Secondary | ICD-10-CM

## 2016-06-07 DIAGNOSIS — C9001 Multiple myeloma in remission: Secondary | ICD-10-CM

## 2016-06-07 LAB — CMP (CANCER CENTER ONLY)
ALT(SGPT): 22 U/L (ref 10–47)
AST: 19 U/L (ref 11–38)
Albumin: 3.6 g/dL (ref 3.3–5.5)
Alkaline Phosphatase: 56 U/L (ref 26–84)
BUN, Bld: 9 mg/dL (ref 7–22)
CO2: 27 mEq/L (ref 18–33)
Calcium: 9.2 mg/dL (ref 8.0–10.3)
Chloride: 103 mEq/L (ref 98–108)
Creat: 0.9 mg/dl (ref 0.6–1.2)
Glucose, Bld: 101 mg/dL (ref 73–118)
Potassium: 2.9 mEq/L — CL (ref 3.3–4.7)
Sodium: 139 mEq/L (ref 128–145)
Total Bilirubin: 0.9 mg/dl (ref 0.20–1.60)
Total Protein: 7.3 g/dL (ref 6.4–8.1)

## 2016-06-07 LAB — CBC WITH DIFFERENTIAL (CANCER CENTER ONLY)
BASO#: 0 10*3/uL (ref 0.0–0.2)
BASO%: 1 % (ref 0.0–2.0)
EOS%: 1.9 % (ref 0.0–7.0)
Eosinophils Absolute: 0.1 10*3/uL (ref 0.0–0.5)
HCT: 31.9 % — ABNORMAL LOW (ref 34.8–46.6)
HGB: 10.6 g/dL — ABNORMAL LOW (ref 11.6–15.9)
LYMPH#: 1 10*3/uL (ref 0.9–3.3)
LYMPH%: 23.2 % (ref 14.0–48.0)
MCH: 27.1 pg (ref 26.0–34.0)
MCHC: 33.2 g/dL (ref 32.0–36.0)
MCV: 82 fL (ref 81–101)
MONO#: 0.7 10*3/uL (ref 0.1–0.9)
MONO%: 16.9 % — ABNORMAL HIGH (ref 0.0–13.0)
NEUT#: 2.4 10*3/uL (ref 1.5–6.5)
NEUT%: 57 % (ref 39.6–80.0)
Platelets: 188 10*3/uL (ref 145–400)
RBC: 3.91 10*6/uL (ref 3.70–5.32)
RDW: 16.8 % — ABNORMAL HIGH (ref 11.1–15.7)
WBC: 4.2 10*3/uL (ref 3.9–10.0)

## 2016-06-07 MED ORDER — ONDANSETRON HCL 8 MG PO TABS
8.0000 mg | ORAL_TABLET | Freq: Once | ORAL | Status: AC
  Administered 2016-06-07: 8 mg via ORAL

## 2016-06-07 MED ORDER — BORTEZOMIB CHEMO SQ INJECTION 3.5 MG (2.5MG/ML)
1.2000 mg/m2 | Freq: Once | INTRAMUSCULAR | Status: AC
  Administered 2016-06-07: 2.5 mg via SUBCUTANEOUS
  Filled 2016-06-07: qty 2.5

## 2016-06-07 MED ORDER — ONDANSETRON HCL 8 MG PO TABS
ORAL_TABLET | ORAL | Status: AC
  Filled 2016-06-07: qty 1

## 2016-06-07 NOTE — Progress Notes (Signed)
Patient was taking off po potasium by PCP due to a change in diuretics. She is aware of her decrease in K++ and was given a copy of labs and she is going to call PCP today to make them aware of the decrease. She feels well otherwise.

## 2016-06-07 NOTE — Patient Instructions (Signed)
Bortezomib injection What is this medicine? BORTEZOMIB (bor TEZ oh mib) is a medicine that targets proteins in cancer cells and stops the cancer cells from growing. It is used to treat multiple myeloma and mantle-cell lymphoma. This medicine may be used for other purposes; ask your health care provider or pharmacist if you have questions. What should I tell my health care provider before I take this medicine? They need to know if you have any of these conditions: -diabetes -heart disease -irregular heartbeat -liver disease -on hemodialysis -low blood counts, like low white blood cells, platelets, or hemoglobin -peripheral neuropathy -taking medicine for blood pressure -an unusual or allergic reaction to bortezomib, mannitol, boron, other medicines, foods, dyes, or preservatives -pregnant or trying to get pregnant -breast-feeding How should I use this medicine? This medicine is for injection into a vein or for injection under the skin. It is given by a health care professional in a hospital or clinic setting. Talk to your pediatrician regarding the use of this medicine in children. Special care may be needed. Overdosage: If you think you have taken too much of this medicine contact a poison control center or emergency room at once. NOTE: This medicine is only for you. Do not share this medicine with others. What if I miss a dose? It is important not to miss your dose. Call your doctor or health care professional if you are unable to keep an appointment. What may interact with this medicine? This medicine may interact with the following medications: -ketoconazole -rifampin -ritonavir -St. John's Wort This list may not describe all possible interactions. Give your health care provider a list of all the medicines, herbs, non-prescription drugs, or dietary supplements you use. Also tell them if you smoke, drink alcohol, or use illegal drugs. Some items may interact with your medicine. What  should I watch for while using this medicine? Visit your doctor for checks on your progress. This drug may make you feel generally unwell. This is not uncommon, as chemotherapy can affect healthy cells as well as cancer cells. Report any side effects. Continue your course of treatment even though you feel ill unless your doctor tells you to stop. You may get drowsy or dizzy. Do not drive, use machinery, or do anything that needs mental alertness until you know how this medicine affects you. Do not stand or sit up quickly, especially if you are an older patient. This reduces the risk of dizzy or fainting spells. In some cases, you may be given additional medicines to help with side effects. Follow all directions for their use. Call your doctor or health care professional for advice if you get a fever, chills or sore throat, or other symptoms of a cold or flu. Do not treat yourself. This drug decreases your body's ability to fight infections. Try to avoid being around people who are sick. This medicine may increase your risk to bruise or bleed. Call your doctor or health care professional if you notice any unusual bleeding. You may need blood work done while you are taking this medicine. In some patients, this medicine may cause a serious brain infection that may cause death. If you have any problems seeing, thinking, speaking, walking, or standing, tell your doctor right away. If you cannot reach your doctor, urgently seek other source of medical care. Do not become pregnant while taking this medicine. Women should inform their doctor if they wish to become pregnant or think they might be pregnant. There is a potential for serious  side effects to an unborn child. Talk to your health care professional or pharmacist for more information. Do not breast-feed an infant while taking this medicine. Check with your doctor or health care professional if you get an attack of severe diarrhea, nausea and vomiting, or if  you sweat a lot. The loss of too much body fluid can make it dangerous for you to take this medicine. What side effects may I notice from receiving this medicine? Side effects that you should report to your doctor or health care professional as soon as possible: -allergic reactions like skin rash, itching or hives, swelling of the face, lips, or tongue -breathing problems -changes in hearing -changes in vision -fast, irregular heartbeat -feeling faint or lightheaded, falls -pain, tingling, numbness in the hands or feet -right upper belly pain -seizures -swelling of the ankles, feet, hands -unusual bleeding or bruising -unusually weak or tired -vomiting -yellowing of the eyes or skin Side effects that usually do not require medical attention (report to your doctor or health care professional if they continue or are bothersome): -changes in emotions or moods -constipation -diarrhea -loss of appetite -headache -irritation at site where injected -nausea This list may not describe all possible side effects. Call your doctor for medical advice about side effects. You may report side effects to FDA at 1-800-FDA-1088. Where should I keep my medicine? This drug is given in a hospital or clinic and will not be stored at home. NOTE: This sheet is a summary. It may not cover all possible information. If you have questions about this medicine, talk to your doctor, pharmacist, or health care provider.    2016, Elsevier/Gold Standard. (2014-03-17 14:47:04)

## 2016-06-08 ENCOUNTER — Ambulatory Visit: Payer: BC Managed Care – PPO | Admitting: Gynecologic Oncology

## 2016-06-08 ENCOUNTER — Ambulatory Visit (HOSPITAL_COMMUNITY)
Admission: RE | Admit: 2016-06-08 | Discharge: 2016-06-08 | Disposition: A | Discharge status: Home / Self Care | Payer: BC Managed Care – PPO | Source: Ambulatory Visit | Attending: Internal Medicine | Admitting: Internal Medicine

## 2016-06-08 ENCOUNTER — Other Ambulatory Visit: Payer: Self-pay | Admitting: Internal Medicine

## 2016-06-08 ENCOUNTER — Telehealth: Payer: Self-pay | Admitting: *Deleted

## 2016-06-08 DIAGNOSIS — R05 Cough: Secondary | ICD-10-CM

## 2016-06-08 DIAGNOSIS — R059 Cough, unspecified: Secondary | ICD-10-CM

## 2016-06-08 DIAGNOSIS — R918 Other nonspecific abnormal finding of lung field: Secondary | ICD-10-CM | POA: Diagnosis not present

## 2016-06-08 DIAGNOSIS — R509 Fever, unspecified: Secondary | ICD-10-CM | POA: Diagnosis present

## 2016-06-08 NOTE — Telephone Encounter (Signed)
I have called the patient back and left a message with the new date/time of her missed appt form today. I left to the office back to confirm the appt.

## 2016-06-16 ENCOUNTER — Other Ambulatory Visit: Payer: BC Managed Care – PPO

## 2016-06-16 ENCOUNTER — Ambulatory Visit: Payer: BC Managed Care – PPO

## 2016-06-20 ENCOUNTER — Other Ambulatory Visit: Payer: Self-pay | Admitting: *Deleted

## 2016-06-21 ENCOUNTER — Ambulatory Visit (HOSPITAL_BASED_OUTPATIENT_CLINIC_OR_DEPARTMENT_OTHER): Payer: BC Managed Care – PPO

## 2016-06-21 ENCOUNTER — Ambulatory Visit: Payer: BC Managed Care – PPO

## 2016-06-21 ENCOUNTER — Other Ambulatory Visit (HOSPITAL_BASED_OUTPATIENT_CLINIC_OR_DEPARTMENT_OTHER): Payer: BC Managed Care – PPO

## 2016-06-21 VITALS — BP 145/69 | HR 83 | Temp 98.1°F | Resp 20

## 2016-06-21 DIAGNOSIS — C9000 Multiple myeloma not having achieved remission: Secondary | ICD-10-CM

## 2016-06-21 DIAGNOSIS — Z5112 Encounter for antineoplastic immunotherapy: Secondary | ICD-10-CM

## 2016-06-21 DIAGNOSIS — C9001 Multiple myeloma in remission: Secondary | ICD-10-CM

## 2016-06-21 LAB — CBC WITH DIFFERENTIAL (CANCER CENTER ONLY)
BASO#: 0 10*3/uL (ref 0.0–0.2)
BASO%: 0.3 % (ref 0.0–2.0)
EOS%: 1.3 % (ref 0.0–7.0)
Eosinophils Absolute: 0 10*3/uL (ref 0.0–0.5)
HCT: 30.4 % — ABNORMAL LOW (ref 34.8–46.6)
HGB: 9.9 g/dL — ABNORMAL LOW (ref 11.6–15.9)
LYMPH#: 1.2 10*3/uL (ref 0.9–3.3)
LYMPH%: 39.7 % (ref 14.0–48.0)
MCH: 26.5 pg (ref 26.0–34.0)
MCHC: 32.6 g/dL (ref 32.0–36.0)
MCV: 82 fL (ref 81–101)
MONO#: 0.3 10*3/uL (ref 0.1–0.9)
MONO%: 10.3 % (ref 0.0–13.0)
NEUT#: 1.5 10*3/uL (ref 1.5–6.5)
NEUT%: 48.4 % (ref 39.6–80.0)
Platelets: 322 10*3/uL (ref 145–400)
RBC: 3.73 10*6/uL (ref 3.70–5.32)
RDW: 17.5 % — ABNORMAL HIGH (ref 11.1–15.7)
WBC: 3 10*3/uL — ABNORMAL LOW (ref 3.9–10.0)

## 2016-06-21 LAB — COMPREHENSIVE METABOLIC PANEL
ALT: 37 U/L (ref 0–55)
AST: 17 U/L (ref 5–34)
Albumin: 3.5 g/dL (ref 3.5–5.0)
Alkaline Phosphatase: 66 U/L (ref 40–150)
Anion Gap: 11 mEq/L (ref 3–11)
BUN: 15.8 mg/dL (ref 7.0–26.0)
CO2: 20 mEq/L — ABNORMAL LOW (ref 22–29)
Calcium: 9.4 mg/dL (ref 8.4–10.4)
Chloride: 109 mEq/L (ref 98–109)
Creatinine: 0.8 mg/dL (ref 0.6–1.1)
EGFR: 86 mL/min/{1.73_m2} — ABNORMAL LOW (ref >90)
Glucose: 90 mg/dl (ref 70–140)
Potassium: 4.3 mEq/L (ref 3.5–5.1)
Sodium: 140 mEq/L (ref 136–145)
Total Bilirubin: 0.32 mg/dL (ref 0.20–1.20)
Total Protein: 7.2 g/dL (ref 6.4–8.3)

## 2016-06-21 MED ORDER — ONDANSETRON HCL 8 MG PO TABS
8.0000 mg | ORAL_TABLET | Freq: Once | ORAL | Status: AC
  Administered 2016-06-21: 8 mg via ORAL

## 2016-06-21 MED ORDER — BORTEZOMIB CHEMO SQ INJECTION 3.5 MG (2.5MG/ML)
1.2000 mg/m2 | Freq: Once | INTRAMUSCULAR | Status: AC
  Administered 2016-06-21: 2.5 mg via SUBCUTANEOUS
  Filled 2016-06-21: qty 1

## 2016-06-21 MED ORDER — ONDANSETRON HCL 8 MG PO TABS
ORAL_TABLET | ORAL | Status: AC
  Filled 2016-06-21: qty 1

## 2016-06-21 NOTE — Patient Instructions (Signed)
Bortezomib injection What is this medicine? BORTEZOMIB (bor TEZ oh mib) is a medicine that targets proteins in cancer cells and stops the cancer cells from growing. It is used to treat multiple myeloma and mantle-cell lymphoma. This medicine may be used for other purposes; ask your health care provider or pharmacist if you have questions. What should I tell my health care provider before I take this medicine? They need to know if you have any of these conditions: -diabetes -heart disease -irregular heartbeat -liver disease -on hemodialysis -low blood counts, like low white blood cells, platelets, or hemoglobin -peripheral neuropathy -taking medicine for blood pressure -an unusual or allergic reaction to bortezomib, mannitol, boron, other medicines, foods, dyes, or preservatives -pregnant or trying to get pregnant -breast-feeding How should I use this medicine? This medicine is for injection into a vein or for injection under the skin. It is given by a health care professional in a hospital or clinic setting. Talk to your pediatrician regarding the use of this medicine in children. Special care may be needed. Overdosage: If you think you have taken too much of this medicine contact a poison control center or emergency room at once. NOTE: This medicine is only for you. Do not share this medicine with others. What if I miss a dose? It is important not to miss your dose. Call your doctor or health care professional if you are unable to keep an appointment. What may interact with this medicine? This medicine may interact with the following medications: -ketoconazole -rifampin -ritonavir -St. John's Wort This list may not describe all possible interactions. Give your health care provider a list of all the medicines, herbs, non-prescription drugs, or dietary supplements you use. Also tell them if you smoke, drink alcohol, or use illegal drugs. Some items may interact with your medicine. What  should I watch for while using this medicine? Visit your doctor for checks on your progress. This drug may make you feel generally unwell. This is not uncommon, as chemotherapy can affect healthy cells as well as cancer cells. Report any side effects. Continue your course of treatment even though you feel ill unless your doctor tells you to stop. You may get drowsy or dizzy. Do not drive, use machinery, or do anything that needs mental alertness until you know how this medicine affects you. Do not stand or sit up quickly, especially if you are an older patient. This reduces the risk of dizzy or fainting spells. In some cases, you may be given additional medicines to help with side effects. Follow all directions for their use. Call your doctor or health care professional for advice if you get a fever, chills or sore throat, or other symptoms of a cold or flu. Do not treat yourself. This drug decreases your body's ability to fight infections. Try to avoid being around people who are sick. This medicine may increase your risk to bruise or bleed. Call your doctor or health care professional if you notice any unusual bleeding. You may need blood work done while you are taking this medicine. In some patients, this medicine may cause a serious brain infection that may cause death. If you have any problems seeing, thinking, speaking, walking, or standing, tell your doctor right away. If you cannot reach your doctor, urgently seek other source of medical care. Do not become pregnant while taking this medicine. Women should inform their doctor if they wish to become pregnant or think they might be pregnant. There is a potential for serious  side effects to an unborn child. Talk to your health care professional or pharmacist for more information. Do not breast-feed an infant while taking this medicine. Check with your doctor or health care professional if you get an attack of severe diarrhea, nausea and vomiting, or if  you sweat a lot. The loss of too much body fluid can make it dangerous for you to take this medicine. What side effects may I notice from receiving this medicine? Side effects that you should report to your doctor or health care professional as soon as possible: -allergic reactions like skin rash, itching or hives, swelling of the face, lips, or tongue -breathing problems -changes in hearing -changes in vision -fast, irregular heartbeat -feeling faint or lightheaded, falls -pain, tingling, numbness in the hands or feet -right upper belly pain -seizures -swelling of the ankles, feet, hands -unusual bleeding or bruising -unusually weak or tired -vomiting -yellowing of the eyes or skin Side effects that usually do not require medical attention (report to your doctor or health care professional if they continue or are bothersome): -changes in emotions or moods -constipation -diarrhea -loss of appetite -headache -irritation at site where injected -nausea This list may not describe all possible side effects. Call your doctor for medical advice about side effects. You may report side effects to FDA at 1-800-FDA-1088. Where should I keep my medicine? This drug is given in a hospital or clinic and will not be stored at home. NOTE: This sheet is a summary. It may not cover all possible information. If you have questions about this medicine, talk to your doctor, pharmacist, or health care provider.    2016, Elsevier/Gold Standard. (2014-03-17 14:47:04)

## 2016-06-22 LAB — PROTEIN ELECTROPHORESIS, SERUM, WITH REFLEX
A/G Ratio: 1 (ref 0.7–1.7)
Albumin: 3.3 g/dL (ref 2.9–4.4)
Alpha 1: 0.3 g/dL (ref 0.0–0.4)
Alpha 2: 1.1 g/dL — ABNORMAL HIGH (ref 0.4–1.0)
Beta: 1.1 g/dL (ref 0.7–1.3)
Gamma Globulin: 0.9 g/dL (ref 0.4–1.8)
Globulin, Total: 3.3 g/dL (ref 2.2–3.9)
Total Protein: 6.6 g/dL (ref 6.0–8.5)

## 2016-06-22 LAB — KAPPA/LAMBDA LIGHT CHAINS
Ig Kappa Free Light Chain: 16.1 mg/L (ref 3.3–19.4)
Ig Lambda Free Light Chain: 58 mg/L — ABNORMAL HIGH (ref 5.7–26.3)
Kappa/Lambda FluidC Ratio: 0.28 (ref 0.26–1.65)

## 2016-06-22 LAB — IGG, IGA, IGM
IgA, Qn, Serum: 154 mg/dL (ref 87–352)
IgG, Qn, Serum: 903 mg/dL (ref 700–1600)
IgM, Qn, Serum: 27 mg/dL (ref 26–217)

## 2016-06-28 ENCOUNTER — Other Ambulatory Visit: Payer: Self-pay | Admitting: *Deleted

## 2016-06-28 MED ORDER — POMALIDOMIDE 1 MG PO CAPS: ORAL_CAPSULE | ORAL | 0 refills | Status: DC

## 2016-06-29 ENCOUNTER — Telehealth: Payer: Self-pay | Admitting: *Deleted

## 2016-06-29 NOTE — Telephone Encounter (Signed)
Patient called and moved appt form July 5th to June 7th. patient aware of the new date/time.

## 2016-06-30 ENCOUNTER — Ambulatory Visit: Payer: BC Managed Care – PPO | Admitting: Hematology & Oncology

## 2016-06-30 ENCOUNTER — Ambulatory Visit: Payer: BC Managed Care – PPO

## 2016-06-30 ENCOUNTER — Other Ambulatory Visit: Payer: Self-pay | Admitting: *Deleted

## 2016-06-30 ENCOUNTER — Other Ambulatory Visit: Payer: BC Managed Care – PPO

## 2016-06-30 DIAGNOSIS — C9001 Multiple myeloma in remission: Secondary | ICD-10-CM

## 2016-07-03 ENCOUNTER — Telehealth: Payer: Self-pay | Admitting: *Deleted

## 2016-07-03 ENCOUNTER — Ambulatory Visit (HOSPITAL_BASED_OUTPATIENT_CLINIC_OR_DEPARTMENT_OTHER): Payer: BC Managed Care – PPO

## 2016-07-03 ENCOUNTER — Ambulatory Visit (HOSPITAL_BASED_OUTPATIENT_CLINIC_OR_DEPARTMENT_OTHER): Payer: BC Managed Care – PPO | Admitting: Hematology & Oncology

## 2016-07-03 ENCOUNTER — Other Ambulatory Visit (HOSPITAL_BASED_OUTPATIENT_CLINIC_OR_DEPARTMENT_OTHER): Payer: BC Managed Care – PPO

## 2016-07-03 DIAGNOSIS — Z5112 Encounter for antineoplastic immunotherapy: Secondary | ICD-10-CM | POA: Diagnosis not present

## 2016-07-03 DIAGNOSIS — C541 Malignant neoplasm of endometrium: Secondary | ICD-10-CM | POA: Diagnosis not present

## 2016-07-03 DIAGNOSIS — C9001 Multiple myeloma in remission: Secondary | ICD-10-CM

## 2016-07-03 DIAGNOSIS — C9000 Multiple myeloma not having achieved remission: Secondary | ICD-10-CM

## 2016-07-03 DIAGNOSIS — I10 Essential (primary) hypertension: Secondary | ICD-10-CM

## 2016-07-03 LAB — CBC WITH DIFFERENTIAL (CANCER CENTER ONLY)
BASO#: 0 10*3/uL (ref 0.0–0.2)
BASO%: 0.2 % (ref 0.0–2.0)
EOS%: 3.6 % (ref 0.0–7.0)
Eosinophils Absolute: 0.2 10*3/uL (ref 0.0–0.5)
HCT: 29.4 % — ABNORMAL LOW (ref 34.8–46.6)
HGB: 9.8 g/dL — ABNORMAL LOW (ref 11.6–15.9)
LYMPH#: 1.9 10*3/uL (ref 0.9–3.3)
LYMPH%: 45.9 % (ref 14.0–48.0)
MCH: 26.7 pg (ref 26.0–34.0)
MCHC: 33.3 g/dL (ref 32.0–36.0)
MCV: 80 fL — ABNORMAL LOW (ref 81–101)
MONO#: 0.6 10*3/uL (ref 0.1–0.9)
MONO%: 15.1 % — ABNORMAL HIGH (ref 0.0–13.0)
NEUT#: 1.5 10*3/uL (ref 1.5–6.5)
NEUT%: 35.2 % — ABNORMAL LOW (ref 39.6–80.0)
Platelets: 187 10*3/uL (ref 145–400)
RBC: 3.67 10*6/uL — ABNORMAL LOW (ref 3.70–5.32)
RDW: 17.4 % — ABNORMAL HIGH (ref 11.1–15.7)
WBC: 4.2 10*3/uL (ref 3.9–10.0)

## 2016-07-03 LAB — CMP (CANCER CENTER ONLY)
ALT(SGPT): 25 U/L (ref 10–47)
AST: 20 U/L (ref 11–38)
Albumin: 3.2 g/dL — ABNORMAL LOW (ref 3.3–5.5)
Alkaline Phosphatase: 75 U/L (ref 26–84)
BUN, Bld: 11 mg/dL (ref 7–22)
CO2: 24 mEq/L (ref 18–33)
Calcium: 8.9 mg/dL (ref 8.0–10.3)
Chloride: 104 mEq/L (ref 98–108)
Creat: 1 mg/dl (ref 0.6–1.2)
Glucose, Bld: 103 mg/dL (ref 73–118)
Potassium: 2.6 mEq/L — CL (ref 3.3–4.7)
Sodium: 136 mEq/L (ref 128–145)
Total Bilirubin: 0.6 mg/dl (ref 0.20–1.60)
Total Protein: 7.7 g/dL (ref 6.4–8.1)

## 2016-07-03 MED ORDER — POTASSIUM CHLORIDE CRYS ER 20 MEQ PO TBCR
40.0000 meq | EXTENDED_RELEASE_TABLET | Freq: Two times a day (BID) | ORAL | Status: DC
  Administered 2016-07-03: 40 meq via ORAL

## 2016-07-03 MED ORDER — ONDANSETRON HCL 8 MG PO TABS
ORAL_TABLET | ORAL | Status: AC
  Filled 2016-07-03: qty 1

## 2016-07-03 MED ORDER — ONDANSETRON HCL 8 MG PO TABS
8.0000 mg | ORAL_TABLET | Freq: Once | ORAL | Status: AC
  Administered 2016-07-03: 8 mg via ORAL

## 2016-07-03 MED ORDER — POTASSIUM CHLORIDE CRYS ER 20 MEQ PO TBCR
40.0000 meq | EXTENDED_RELEASE_TABLET | Freq: Once | ORAL | Status: DC
  Filled 2016-07-03: qty 2

## 2016-07-03 MED ORDER — BORTEZOMIB CHEMO SQ INJECTION 3.5 MG (2.5MG/ML)
1.2000 mg/m2 | Freq: Once | INTRAMUSCULAR | Status: AC
  Administered 2016-07-03: 2.5 mg via SUBCUTANEOUS
  Filled 2016-07-03: qty 2.5

## 2016-07-03 MED ORDER — POTASSIUM CHLORIDE CRYS ER 20 MEQ PO TBCR: EXTENDED_RELEASE_TABLET | ORAL | 6 refills | Status: DC

## 2016-07-03 NOTE — Progress Notes (Signed)
Hematology and Oncology Follow Up Visit  Emily Greer [id number] 08/27/1950 66 y.o. 07/03/2016   Principle Diagnosis:   Recurrent lambda light chain myeloma  History of recurrent endometrial carcinoma  Current Therapy:    Status post second autologous stem cell transplant on 07/24/2014  Maintenance therapy with Pomalidomide/every 2 week Velcade  Xgeva 120 mg subcutaneous every 3 months-start in April 2018  Radiation therapy for endometrial recurrence - completed 04/20/2015  Femara 2.5mg  po q day     Interim History:  [name] is back for followup. She is doing pretty well. She has had no specific complaints. She and her husband are still moving into their house. They have been busy traveling to visit family. There have elderly parents in [place].  I have noticed that her lambda light chain is creeping up.  Over the past 3 months, her in light chain has gone from 3.1 mg/dL up to 5.8 mg/dL.  We did do a 24 urine back in March. This showed that she had 13 mg of light chain in her urine.  It is possible that we might be seeing some resistance to the Velcade/Pomalidomide.  She's had no pain. She's had no nausea or vomiting. She is on letrozole. She is doing well with this.  She had female surgery back in January. She has recovered from this nicely.  Her appetite is well well. She's had no nausea or vomiting. She's had no fever. She's had no bleeding. Her weight is up a little bit. Her potassium is down  Overall, I see that her performance status is ECOG 1.  .  Medications: Allergies:  Allergies  Allergen Reactions  . Codeine Nausea Only    Past Medical History, Surgical history, Social history, and Family History were reviewed and updated.  Review of Systems: As above  Physical Exam:  weight is 192 lb 12 oz (87.4 kg). Her oral temperature is 99 F (37.2 C). Her blood pressure is 131/71 and her pulse is 95. Her respiration is 18 and oxygen  saturation is 97%.   Well-developed and well-nourished [name]. Head and neck exam shows no ocular or oral lesion. There are no palpable cervical or supraclavicular lymph nodes. Lungs are clear. Cardiac exam regular rate and rhythm with no murmurs rubs or bruits. Abdomen is soft. She has good bowel sounds. There is no fluid wave. There is no palpable liver or spleen tip. Extremities shows no clubbing, cyanosis or edema. Neurological exam shows no focal neurological deficits. Skin exam no rashes, ecchymosis or petechia.  Lab Results  Component Value Date   WBC 4.2 07/03/2016   HGB 9.8 (L) 07/03/2016   HCT 29.4 (L) 07/03/2016   MCV 80 (L) 07/03/2016   PLT 187 07/03/2016     Chemistry      Component Value Date/Time   NA 136 07/03/2016 0849   NA 140 06/21/2016 0918   K 2.6 (LL) 07/03/2016 0849   K 4.3 06/21/2016 0918   CL 104 07/03/2016 0849   CO2 24 07/03/2016 0849   CO2 20 (L) 06/21/2016 0918   BUN 11 07/03/2016 0849   BUN 15.8 06/21/2016 0918   CREATININE 1.0 07/03/2016 0849   CREATININE 0.8 06/21/2016 0918      Component Value Date/Time   CALCIUM 8.9 07/03/2016 0849   CALCIUM 9.4 06/21/2016 0918   ALKPHOS 75 07/03/2016 0849   ALKPHOS 66 06/21/2016 0918   AST 20 07/03/2016 0849   AST 17 06/21/2016 0918   ALT  25 07/03/2016 0849   ALT 37 06/21/2016 0918   BILITOT 0.60 07/03/2016 0849   BILITOT 0.32 06/21/2016 1610         Impression and Plan: Emily Greer is 66 year old female. She has recurrent lambda light chain myeloma. She also had recurrent endometrial cancer. The recurrence was localized. She is being followed by OB/GYN and radiation oncology for this.   We will have to watch her light chains closely.  If I find that the light chains are increasing, then I think we might have to consider monoclonal antibody therapy. I talked her about this.  I will like to see her back in 2 weeks. We will check her light chains. We'll do another 24-hour urine on her.   [name], MD 6/4/20189:29 AM

## 2016-07-03 NOTE — Patient Instructions (Signed)

## 2016-07-03 NOTE — Telephone Encounter (Signed)
Critical Value Potassium 2.6 Dr Marin Olp notified. No orders at this time.

## 2016-07-04 ENCOUNTER — Ambulatory Visit: Payer: BC Managed Care – PPO | Admitting: Family

## 2016-07-04 ENCOUNTER — Other Ambulatory Visit: Payer: BC Managed Care – PPO

## 2016-07-04 ENCOUNTER — Ambulatory Visit: Payer: BC Managed Care – PPO

## 2016-07-06 ENCOUNTER — Other Ambulatory Visit (HOSPITAL_COMMUNITY)
Admission: RE | Admit: 2016-07-06 | Discharge: 2016-07-06 | Disposition: A | Discharge status: Home / Self Care | Payer: BC Managed Care – PPO | Source: Ambulatory Visit | Attending: Gynecologic Oncology | Admitting: Gynecologic Oncology

## 2016-07-06 ENCOUNTER — Encounter: Payer: Self-pay | Admitting: Gynecologic Oncology

## 2016-07-06 ENCOUNTER — Ambulatory Visit: Payer: BC Managed Care – PPO | Attending: Gynecologic Oncology | Admitting: Gynecologic Oncology

## 2016-07-06 VITALS — BP 151/89 | HR 91 | Temp 98.5°F | Resp 20 | Wt 195.5 lb

## 2016-07-06 DIAGNOSIS — C541 Malignant neoplasm of endometrium: Secondary | ICD-10-CM | POA: Insufficient documentation

## 2016-07-06 DIAGNOSIS — Z8579 Personal history of other malignant neoplasms of lymphoid, hematopoietic and related tissues: Secondary | ICD-10-CM | POA: Diagnosis not present

## 2016-07-06 DIAGNOSIS — K219 Gastro-esophageal reflux disease without esophagitis: Secondary | ICD-10-CM | POA: Insufficient documentation

## 2016-07-06 DIAGNOSIS — I10 Essential (primary) hypertension: Secondary | ICD-10-CM | POA: Insufficient documentation

## 2016-07-06 DIAGNOSIS — Z8619 Personal history of other infectious and parasitic diseases: Secondary | ICD-10-CM | POA: Insufficient documentation

## 2016-07-06 DIAGNOSIS — N952 Postmenopausal atrophic vaginitis: Secondary | ICD-10-CM

## 2016-07-06 DIAGNOSIS — Z90710 Acquired absence of both cervix and uterus: Secondary | ICD-10-CM | POA: Insufficient documentation

## 2016-07-06 DIAGNOSIS — M879 Osteonecrosis, unspecified: Secondary | ICD-10-CM | POA: Diagnosis not present

## 2016-07-06 DIAGNOSIS — M810 Age-related osteoporosis without current pathological fracture: Secondary | ICD-10-CM | POA: Diagnosis not present

## 2016-07-06 DIAGNOSIS — N890 Mild vaginal dysplasia: Secondary | ICD-10-CM | POA: Diagnosis not present

## 2016-07-06 DIAGNOSIS — Z90722 Acquired absence of ovaries, bilateral: Secondary | ICD-10-CM | POA: Diagnosis not present

## 2016-07-06 DIAGNOSIS — Z9889 Other specified postprocedural states: Secondary | ICD-10-CM | POA: Insufficient documentation

## 2016-07-06 DIAGNOSIS — C9001 Multiple myeloma in remission: Secondary | ICD-10-CM

## 2016-07-06 NOTE — Patient Instructions (Signed)
Dr Skeet Latch will notify you with the results of the PAP Smear when available.   Follow up with Dr. Skeet Latch in 6 months as scheduled.

## 2016-07-06 NOTE — Progress Notes (Signed)
Office Visit:  GYN ONCOLOGY  CC:   Recurrent endometrial cancer , VAIN I   Assessment:   66 y.o.  year old with Stage1A Grade 2 endometrioid endometrial cancer staged 10/2011 with recurrence at the distal vagina identified in April 2014.  Subsequent PET scan was negative for evidence of metastatic disease and Emily Donate WoodwardCompleted vaginal  brachytherapy 07/29/2012.  Second vaginal recurrence (apex) diagnosed December 2016. Imaging without evidence of  metastatic disease.    Options discussed at multidisciplinary tumor conference and she was treated with conformational radiotherapy   03/10/2015 - 04/20/2015  Abnormal Pap Pap 10/2015 ASCUS HPV + Colposcopy with bx vaginal apex 12/2015 c/w VAIN I S/P Co2 laser of the vagina 02/22/2016  Pap collected. Continue FemaraF/U with Dr. Leo Grosser  As scheduled Follow-up with Gyn Onc in 6 months   Bilateral femoral necrosis on imaging Counselled on safe ambulation   IHCC positive testing Declined genetic counseling Consider MSI testing of tumor for consideration of  PDL1 checkpoint inhibitor if there is recurrence   HPI:  Emily Greer is a 66 y.o. year old G3P2 initially seen in consultation on 10/05/2011  grade 1  endometrial cancer  She then underwent a  total abdominal hysterectomy bilateral salpingo-oophorectomy bilateral pelvic lymph node dissection on  79/89/2119 without complications.  Her postoperative course was  uncomplicated.  Her final pathologic diagnosis is a Stage  1A Grade  2 endometrioid endometrial cancer with  negative lymphovascular space invasion,  2/20 (10%) of myometrial invasion and negative lymph nodes.  On 01/2012 visit she reported  post coital vaginal bleeding  that was self limiting. Vaginal biopsy - LARGELY DENUDED SQUAMOUS EPITHELIUM WITH ASSOCIATED SPONGIOSIS AND CHRONIC INFLAMMATION.- NO DYSPLASIA OR MALIGNANCY  On the visit 04/2012 she c/o vaginal bleeding.  Biopsies were c/w metastatic disease at the distal  vagina  PET 06/05/2012 IMPRESSION:  1. There are no specific features identified to suggest  hypermetabolic metastasis from endometrial carcinoma.  2. Skeletal changes of multiple myeloma with focal area of increased uptake   Treated with vaginal brachytherapy June 4, June 11, June 19, June 25, June 30/2014 Site/dose: Vagina, 30.5 Gy in 5 fractions (6 Gy, 6 Gy, 6 Gy, 6 Gy, 6.5 Gy)  Pap 08/17/2013 ASCUS HPV neg  She was seen by Dr Leo Grosser on 01/21/15 at which time a friable lesion was noted at the vaginal cuff. This was biopsied and was consistent with endometrioid adenocarcinoma (recurrence).  CT C/A/P 02/15/2015 without evidence of metastatic disease.  Care complicated by successful transplant 2016 for Rx multiple myeloma. Received conformational radiotherapy completed 03/2015 and then started on femara  CT C/A/P 07/26/2015 - PET declined by insurance- 1. Stable examinations demonstrating no evidence of metastatic endometrial carcinoma. 2. Stable widespread osseous findings attributed to multiple myeloma. No evidence of pathologic fracture or epidural tumor. 3. Grossly stable small left thyroid nodule. 4. Chronic bilateral femoral head avascular necrosis.  Interval history: Pap collected by Dr. Leo Grosser in October 2017 ASCUS positive for high risk HPV virus.   Colposcopy 12/30/2015  c/w  Vagina, biopsy, vaginal apex - LOW GRADE SQUAMOUS INTRAEPITHELIAL LESION, CIN-I (MILD DYSPLASIA). - NO MALIGNANCY IDENTIFIED.  S/P Co2 laser of the vagina 02/22/2016  Interval history: Reports spotting  with episodes of constipation. Recently treated for pneumonia.    Past Medical History:  Diagnosis Date  . Avascular necrosis of femoral head ([name])    bilateral per CT 07-26-2015  . Endometrial carcinoma [facility]) gyn oncologist-  dr Skeet Latch ([facility])/  radiation oncologist-- dr Sondra Come   2013 dx  FIGO Stage 1A, Grade 2 endometrioid endometrial cancer s/p TAH w/ BSO and bilateral pelvic node  dissection 10-31-2011 ;  recurrence at distal vagina 04/ 2014 s/p  brachytherapy (ended 07-29-2012);  2nd recurrence 12/ 2016  vaginal apex s/p  conformational radiotherapy 03-10-2015 to 04-20-2015  . GERD (gastroesophageal reflux disease)   . H/O stem cell transplant ([place])    02/ 2000 and second one 06/ 2016  . 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 History of radiation therapy 6/4, 6/11, 6/19, 6/25, 07/29/2012   vagina 30.5 gray in 5 fx, HDR brachytherapy:   last radiation to vagina 03-10-2015 to 04-20-2015  50.4gray  . History of radiation therapy 03/10/15-04/20/15   vagina 50.4 in 28 fractions  . Hypertension   . Lambda light chain myeloma [facility]) oncologist-  dr Marin Olp ([facility])  and  [facility] -- dr Salena Saner gasparetto   dx 07/ 1999 s/p  VAD chemotherapy 11/ 1999,  purged autotransplant 03-21-1998 followed by auto stem cell transplant 03-29-1998;  recurrance w/ second autologous stem cell transplant 07-24-2014;  in Re-mission currently , chemo maintenance therapy  . Osteoporosis 12/18/05   Increased  risk   . PONV (postoperative nausea and vomiting)   . Wears glasses      Past Surgical History:  Procedure Laterality Date  . ABDOMINAL HYSTERECTOMY  10/31/2011   Procedure: HYSTERECTOMY ABDOMINAL;  Surgeon: [name], MD PHD;  Location: [facility];  Service: Gynecology;  Laterality: N/A;  . CO2 LASER APPLICATION N/A 08/07/6281   Procedure: CO2 LASER APPLICATION;  Surgeon: [name], MD;  Location: [facility];  Service: Gynecology;  Laterality: N/A;  . ECTOPIC PREGNANCY SURGERY  1992  . HYSTEROSCOPY W/D&C  09/27/2011   Procedure: DILATATION AND CURETTAGE /HYSTEROSCOPY;  Surgeon: Eldred Manges, MD;  Location: [facility];  Service: Gynecology;;  . LAPAROTOMY  10/31/2011   Procedure: EXPLORATORY LAPAROTOMY;  Surgeon: [name], MD PHD;  Location: [facility];  Service: Gynecology;  Laterality: N/A;  . SALPINGOOPHORECTOMY   10/31/2011   Procedure: SALPINGO OOPHERECTOMY;  Surgeon: [name], MD PHD;  Location: [facility];  Service: Gynecology;  Laterality: Bilateral;   Lymph Nodes sampling  . 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 Family is well  Review of systems: Constitutional:  She has no fever or chills. No changes in weight.  Cardiovascular: No chest pain, palpitations or edema. Respiratory:  No shortness of breath, wheezing or cough Gastrointestinal: She has normal bowel movements without diarrhea or constipation. She denies any nausea or vomiting.   Genitourinary:  She denies pelvic pain, pelvic pressure or changes in her urinary function. + vaginal bleeding (light) with constipation Otherwise uninformative 10 point review of system   Physical Exam: Blood pressure (!) 151/89, pulse 91, temperature 98.5 F (36.9 C), temperature source Oral, resp. rate 20, weight 195 lb 8 oz (88.7 kg). Wt Readings from Last 3 Encounters:  07/06/16 195 lb 8 oz (88.7 kg)  07/03/16 192 lb 12 oz (87.4 kg)  05/24/16 195 lb 1.9 oz (88.5 kg)   General: Well dressed, well nourished in no apparent distress.   Abdomen:  Soft, nontender, nondistended.  No palpable masses.  No hepatosplenomegaly.  No ascites. Normal bowel sounds.  No hernias.   Genitourinary: Normal EGBUS radiation changes at the vaginal apex.  Vagina foreshortened, vaginal agglutination.  Radiation changes present .   Chest:  CTA LN:  No cervical supraclavicular or inguinal adenopathy Rectal:  Deferred.  Back: No CVA tenderness

## 2016-07-13 LAB — CYTOLOGY - PAP
Diagnosis: UNDETERMINED — AB
HPV: DETECTED — AB

## 2016-07-18 ENCOUNTER — Other Ambulatory Visit: Payer: Self-pay | Admitting: *Deleted

## 2016-07-18 DIAGNOSIS — C9001 Multiple myeloma in remission: Secondary | ICD-10-CM

## 2016-07-19 ENCOUNTER — Ambulatory Visit: Payer: BC Managed Care – PPO

## 2016-07-19 ENCOUNTER — Other Ambulatory Visit: Payer: BC Managed Care – PPO

## 2016-07-19 ENCOUNTER — Ambulatory Visit: Payer: BC Managed Care – PPO | Admitting: Family

## 2016-07-20 ENCOUNTER — Other Ambulatory Visit (HOSPITAL_BASED_OUTPATIENT_CLINIC_OR_DEPARTMENT_OTHER): Payer: BC Managed Care – PPO

## 2016-07-20 ENCOUNTER — Ambulatory Visit (HOSPITAL_BASED_OUTPATIENT_CLINIC_OR_DEPARTMENT_OTHER): Payer: BC Managed Care – PPO

## 2016-07-20 ENCOUNTER — Telehealth: Payer: Self-pay | Admitting: Gynecologic Oncology

## 2016-07-20 VITALS — BP 111/64 | HR 74 | Temp 98.1°F | Resp 17

## 2016-07-20 DIAGNOSIS — C9000 Multiple myeloma not having achieved remission: Secondary | ICD-10-CM | POA: Diagnosis not present

## 2016-07-20 DIAGNOSIS — Z5112 Encounter for antineoplastic immunotherapy: Secondary | ICD-10-CM | POA: Diagnosis not present

## 2016-07-20 DIAGNOSIS — I10 Essential (primary) hypertension: Secondary | ICD-10-CM

## 2016-07-20 DIAGNOSIS — C9001 Multiple myeloma in remission: Secondary | ICD-10-CM

## 2016-07-20 LAB — CBC WITH DIFFERENTIAL (CANCER CENTER ONLY)
BASO#: 0.1 10*3/uL (ref 0.0–0.2)
BASO%: 2.6 % — ABNORMAL HIGH (ref 0.0–2.0)
EOS%: 2.6 % (ref 0.0–7.0)
Eosinophils Absolute: 0.1 10*3/uL (ref 0.0–0.5)
HCT: 30.1 % — ABNORMAL LOW (ref 34.8–46.6)
HGB: 9.9 g/dL — ABNORMAL LOW (ref 11.6–15.9)
LYMPH#: 1.1 10*3/uL (ref 0.9–3.3)
LYMPH%: 37.2 % (ref 14.0–48.0)
MCH: 26.9 pg (ref 26.0–34.0)
MCHC: 32.9 g/dL (ref 32.0–36.0)
MCV: 82 fL (ref 81–101)
MONO#: 0.5 10*3/uL (ref 0.1–0.9)
MONO%: 15.5 % — ABNORMAL HIGH (ref 0.0–13.0)
NEUT#: 1.3 10*3/uL — ABNORMAL LOW (ref 1.5–6.5)
NEUT%: 42.1 % (ref 39.6–80.0)
Platelets: 254 10*3/uL (ref 145–400)
RBC: 3.68 10*6/uL — ABNORMAL LOW (ref 3.70–5.32)
RDW: 18.7 % — ABNORMAL HIGH (ref 11.1–15.7)
WBC: 3 10*3/uL — ABNORMAL LOW (ref 3.9–10.0)

## 2016-07-20 LAB — CMP (CANCER CENTER ONLY)
ALT(SGPT): 24 U/L (ref 10–47)
AST: 20 U/L (ref 11–38)
Albumin: 3.5 g/dL (ref 3.3–5.5)
Alkaline Phosphatase: 69 U/L (ref 26–84)
BUN, Bld: 19 mg/dL (ref 7–22)
CO2: 24 mEq/L (ref 18–33)
Calcium: 9.3 mg/dL (ref 8.0–10.3)
Chloride: 111 mEq/L — ABNORMAL HIGH (ref 98–108)
Creat: 0.9 mg/dl (ref 0.6–1.2)
Glucose, Bld: 99 mg/dL (ref 73–118)
Potassium: 3.6 mEq/L (ref 3.3–4.7)
Sodium: 139 mEq/L (ref 128–145)
Total Bilirubin: 0.6 mg/dl (ref 0.20–1.60)
Total Protein: 6.9 g/dL (ref 6.4–8.1)

## 2016-07-20 MED ORDER — ONDANSETRON HCL 8 MG PO TABS
ORAL_TABLET | ORAL | Status: AC
  Filled 2016-07-20: qty 1

## 2016-07-20 MED ORDER — ONDANSETRON HCL 8 MG PO TABS
8.0000 mg | ORAL_TABLET | Freq: Once | ORAL | Status: AC
  Administered 2016-07-20: 8 mg via ORAL

## 2016-07-20 MED ORDER — BORTEZOMIB CHEMO SQ INJECTION 3.5 MG (2.5MG/ML)
1.2000 mg/m2 | Freq: Once | INTRAMUSCULAR | Status: AC
  Administered 2016-07-20: 2.5 mg via SUBCUTANEOUS
  Filled 2016-07-20: qty 2.5

## 2016-07-20 NOTE — Progress Notes (Signed)
Ok to treat with [place] today per MD Marin Olp

## 2016-07-20 NOTE — Telephone Encounter (Signed)
Patient called back.  Informed of pap results and Dr. Leone Brand recommendations for colpo.  Appt made for July 5.  No concerns voiced.  Advised to call for any needs.

## 2016-07-20 NOTE — Patient Instructions (Signed)

## 2016-07-20 NOTE — Telephone Encounter (Signed)
Left message for patient to please call the office.  Based on her recent pap smear, she needs to come in the office for colposcopy per Dr. Skeet Latch.

## 2016-07-21 LAB — IGG, IGA, IGM
IgA, Qn, Serum: 167 mg/dL (ref 87–352)
IgG, Qn, Serum: 1078 mg/dL (ref 700–1600)
IgM, Qn, Serum: 23 mg/dL — ABNORMAL LOW (ref 26–217)

## 2016-07-21 LAB — KAPPA/LAMBDA LIGHT CHAINS
Ig Kappa Free Light Chain: 15 mg/L (ref 3.3–19.4)
Ig Lambda Free Light Chain: 50.7 mg/L — ABNORMAL HIGH (ref 5.7–26.3)
Kappa/Lambda FluidC Ratio: 0.3 (ref 0.26–1.65)

## 2016-07-23 LAB — PROTEIN ELECTROPHORESIS, SERUM, WITH REFLEX
A/G Ratio: 1.2 (ref 0.7–1.7)
Albumin: 3.5 g/dL (ref 2.9–4.4)
Alpha 1: 0.2 g/dL (ref 0.0–0.4)
Alpha 2: 0.7 g/dL (ref 0.4–1.0)
Beta: 1.1 g/dL (ref 0.7–1.3)
Gamma Globulin: 1 g/dL (ref 0.4–1.8)
Globulin, Total: 2.9 g/dL (ref 2.2–3.9)
Total Protein: 6.4 g/dL (ref 6.0–8.5)

## 2016-07-25 ENCOUNTER — Telehealth: Payer: Self-pay | Admitting: Gynecologic Oncology

## 2016-07-25 NOTE — Telephone Encounter (Signed)
Returned call to patient.  Answered questions about recent pap results and plan for office visit.  Advised to call for any needs.

## 2016-07-26 ENCOUNTER — Other Ambulatory Visit: Payer: Self-pay | Admitting: Hematology & Oncology

## 2016-07-26 ENCOUNTER — Other Ambulatory Visit: Payer: Self-pay | Admitting: *Deleted

## 2016-07-26 MED ORDER — POMALIDOMIDE 1 MG PO CAPS: ORAL_CAPSULE | ORAL | 8 refills | Status: DC

## 2016-07-27 LAB — UIFE/LIGHT CHAINS/TP QN, 24-HR UR
FR KAPPA LT CH,24HR: 68 mg/24 hr
FR LAMBDA LT CH,24HR: 80 mg/24 hr
Free Kappa Lt Chains,Ur: 28.8 mg/L — ABNORMAL HIGH (ref 1.35–24.19)
Free Lambda Lt Chains,Ur: 34.1 mg/L — ABNORMAL HIGH (ref 0.24–6.66)
Kappa/Lambda Ratio,U: 0.84 — ABNORMAL LOW (ref 2.04–10.37)
PROTEIN,TOTAL,URINE: 5 mg/dL
Prot,24hr calculated: 118 mg/24 hr (ref 30–150)

## 2016-08-03 ENCOUNTER — Ambulatory Visit: Payer: BC Managed Care – PPO | Attending: Gynecologic Oncology | Admitting: Gynecologic Oncology

## 2016-08-03 ENCOUNTER — Ambulatory Visit (HOSPITAL_BASED_OUTPATIENT_CLINIC_OR_DEPARTMENT_OTHER): Payer: BC Managed Care – PPO | Admitting: Hematology & Oncology

## 2016-08-03 ENCOUNTER — Ambulatory Visit (HOSPITAL_BASED_OUTPATIENT_CLINIC_OR_DEPARTMENT_OTHER): Payer: BC Managed Care – PPO

## 2016-08-03 ENCOUNTER — Ambulatory Visit: Payer: BC Managed Care – PPO | Admitting: Gynecologic Oncology

## 2016-08-03 ENCOUNTER — Other Ambulatory Visit (HOSPITAL_BASED_OUTPATIENT_CLINIC_OR_DEPARTMENT_OTHER): Payer: BC Managed Care – PPO

## 2016-08-03 VITALS — BP 133/73 | HR 71 | Temp 98.4°F | Resp 20 | Wt 194.0 lb

## 2016-08-03 VITALS — BP 133/64 | HR 87 | Temp 98.6°F | Resp 17 | Wt 194.0 lb

## 2016-08-03 DIAGNOSIS — C9000 Multiple myeloma not having achieved remission: Secondary | ICD-10-CM | POA: Diagnosis not present

## 2016-08-03 DIAGNOSIS — M810 Age-related osteoporosis without current pathological fracture: Secondary | ICD-10-CM | POA: Diagnosis not present

## 2016-08-03 DIAGNOSIS — C541 Malignant neoplasm of endometrium: Secondary | ICD-10-CM

## 2016-08-03 DIAGNOSIS — C7982 Secondary malignant neoplasm of genital organs: Secondary | ICD-10-CM | POA: Diagnosis not present

## 2016-08-03 DIAGNOSIS — M879 Osteonecrosis, unspecified: Secondary | ICD-10-CM | POA: Diagnosis not present

## 2016-08-03 DIAGNOSIS — K219 Gastro-esophageal reflux disease without esophagitis: Secondary | ICD-10-CM | POA: Diagnosis not present

## 2016-08-03 DIAGNOSIS — C9001 Multiple myeloma in remission: Secondary | ICD-10-CM | POA: Insufficient documentation

## 2016-08-03 DIAGNOSIS — Z90722 Acquired absence of ovaries, bilateral: Secondary | ICD-10-CM | POA: Insufficient documentation

## 2016-08-03 DIAGNOSIS — Z9221 Personal history of antineoplastic chemotherapy: Secondary | ICD-10-CM | POA: Diagnosis not present

## 2016-08-03 DIAGNOSIS — Z90710 Acquired absence of both cervix and uterus: Secondary | ICD-10-CM | POA: Insufficient documentation

## 2016-08-03 DIAGNOSIS — R87619 Unspecified abnormal cytological findings in specimens from cervix uteri: Secondary | ICD-10-CM | POA: Diagnosis not present

## 2016-08-03 DIAGNOSIS — N890 Mild vaginal dysplasia: Secondary | ICD-10-CM | POA: Insufficient documentation

## 2016-08-03 DIAGNOSIS — I10 Essential (primary) hypertension: Secondary | ICD-10-CM | POA: Insufficient documentation

## 2016-08-03 DIAGNOSIS — Z923 Personal history of irradiation: Secondary | ICD-10-CM | POA: Diagnosis not present

## 2016-08-03 DIAGNOSIS — Z9484 Stem cells transplant status: Secondary | ICD-10-CM | POA: Diagnosis not present

## 2016-08-03 DIAGNOSIS — Z79811 Long term (current) use of aromatase inhibitors: Secondary | ICD-10-CM | POA: Insufficient documentation

## 2016-08-03 DIAGNOSIS — Z5112 Encounter for antineoplastic immunotherapy: Secondary | ICD-10-CM

## 2016-08-03 DIAGNOSIS — N952 Postmenopausal atrophic vaginitis: Secondary | ICD-10-CM

## 2016-08-03 DIAGNOSIS — Z8542 Personal history of malignant neoplasm of other parts of uterus: Secondary | ICD-10-CM | POA: Insufficient documentation

## 2016-08-03 LAB — CMP (CANCER CENTER ONLY)
ALT(SGPT): 23 U/L (ref 10–47)
AST: 18 U/L (ref 11–38)
Albumin: 3.4 g/dL (ref 3.3–5.5)
Alkaline Phosphatase: 57 U/L (ref 26–84)
BUN, Bld: 14 mg/dL (ref 7–22)
CO2: 26 mEq/L (ref 18–33)
Calcium: 9.7 mg/dL (ref 8.0–10.3)
Chloride: 103 mEq/L (ref 98–108)
Creat: 1 mg/dl (ref 0.6–1.2)
Glucose, Bld: 101 mg/dL (ref 73–118)
Potassium: 3.8 mEq/L (ref 3.3–4.7)
Sodium: 139 mEq/L (ref 128–145)
Total Bilirubin: 0.6 mg/dl (ref 0.20–1.60)
Total Protein: 7.5 g/dL (ref 6.4–8.1)

## 2016-08-03 LAB — CBC WITH DIFFERENTIAL (CANCER CENTER ONLY)
BASO#: 0 10*3/uL (ref 0.0–0.2)
BASO%: 0.9 % (ref 0.0–2.0)
EOS%: 4.8 % (ref 0.0–7.0)
Eosinophils Absolute: 0.2 10*3/uL (ref 0.0–0.5)
HCT: 33.4 % — ABNORMAL LOW (ref 34.8–46.6)
HGB: 10.9 g/dL — ABNORMAL LOW (ref 11.6–15.9)
LYMPH#: 1 10*3/uL (ref 0.9–3.3)
LYMPH%: 31 % (ref 14.0–48.0)
MCH: 26.8 pg (ref 26.0–34.0)
MCHC: 32.6 g/dL (ref 32.0–36.0)
MCV: 82 fL (ref 81–101)
MONO#: 0.5 10*3/uL (ref 0.1–0.9)
MONO%: 15.5 % — ABNORMAL HIGH (ref 0.0–13.0)
NEUT#: 1.6 10*3/uL (ref 1.5–6.5)
NEUT%: 47.8 % (ref 39.6–80.0)
Platelets: 226 10*3/uL (ref 145–400)
RBC: 4.07 10*6/uL (ref 3.70–5.32)
RDW: 17.8 % — ABNORMAL HIGH (ref 11.1–15.7)
WBC: 3.4 10*3/uL — ABNORMAL LOW (ref 3.9–10.0)

## 2016-08-03 MED ORDER — ONDANSETRON HCL 8 MG PO TABS
8.0000 mg | ORAL_TABLET | Freq: Once | ORAL | Status: AC
  Administered 2016-08-03: 8 mg via ORAL

## 2016-08-03 MED ORDER — BORTEZOMIB CHEMO SQ INJECTION 3.5 MG (2.5MG/ML)
1.2000 mg/m2 | Freq: Once | INTRAMUSCULAR | Status: AC
  Administered 2016-08-03: 2.5 mg via SUBCUTANEOUS
  Filled 2016-08-03: qty 2.5

## 2016-08-03 MED ORDER — ONDANSETRON HCL 8 MG PO TABS
ORAL_TABLET | ORAL | Status: AC
  Filled 2016-08-03: qty 1

## 2016-08-03 NOTE — Progress Notes (Signed)
Office Visit:  GYN ONCOLOGY  CC:   Recurrent endometrial cancer , VAIN I   Assessment:   66 y.o.  year old with Stage1A Grade 2 endometrioid endometrial cancer staged 10/2011 with recurrence at the distal vagina identified in April 2014.  Subsequent PET scan was negative for evidence of metastatic disease and Mishelle Hassan WoodwardCompleted vaginal  brachytherapy 07/29/2012.  Second vaginal recurrence (apex) diagnosed December 2016. Imaging without evidence of  metastatic disease.    Options discussed at multidisciplinary tumor conference and she was treated with conformational radiotherapy   03/10/2015 - 04/20/2015 Continue Femara  Abnormal Pap Pap 10/2015 ASCUS HPV + Colposcopy with bx vaginal apex 12/2015 c/w VAIN I S/P Co2 laser of the vagina 02/22/2016  Pap 06/2016 ASCUS HPV + Colposcopy today with bx.  If VAIN 1 will follow Will contact with results Follow-up 12/2016   Bilateral femoral necrosis on imaging Counselled on safe ambulation   IHCC positive testing Declined genetic counseling Consider MSI testing of tumor for consideration of  PDL1 checkpoint inhibitor if there is recurrence   HPI:  Emily Greer is a 66 y.o. year old G3P2 initially seen in consultation on 10/05/2011  grade 1  endometrial cancer  She then underwent a  total abdominal hysterectomy bilateral salpingo-oophorectomy bilateral pelvic lymph node dissection on  81/27/5170 without complications.  Her postoperative course was  uncomplicated.  Her final pathologic diagnosis is a Stage  1A Grade  2 endometrioid endometrial cancer with  negative lymphovascular space invasion,  2/20 (10%) of myometrial invasion and negative lymph nodes.  On 01/2012 visit she reported  post coital vaginal bleeding  that was self limiting. Vaginal biopsy - LARGELY DENUDED SQUAMOUS EPITHELIUM WITH ASSOCIATED SPONGIOSIS AND CHRONIC INFLAMMATION.- NO DYSPLASIA OR MALIGNANCY  On the visit 04/2012 she c/o vaginal bleeding.  Biopsies were c/w  metastatic disease at the distal vagina  PET 06/05/2012 IMPRESSION:  1. There are no specific features identified to suggest  hypermetabolic metastasis from endometrial carcinoma.  2. Skeletal changes of multiple myeloma with focal area of increased uptake   Treated with vaginal brachytherapy June 4, June 11, June 19, June 25, June 30/2014 Site/dose: Vagina, 30.5 Gy in 5 fractions (6 Gy, 6 Gy, 6 Gy, 6 Gy, 6.5 Gy)  Pap 08/17/2013 ASCUS HPV neg  She was seen by Dr Leo Grosser on 01/21/15 at which time a friable lesion was noted at the vaginal cuff. This was biopsied and was consistent with endometrioid adenocarcinoma (recurrence).  CT C/A/P 02/15/2015 without evidence of metastatic disease.  Care complicated by successful transplant 2016 for Rx multiple myeloma. Received conformational radiotherapy completed 03/2015 and then started on femara  CT C/A/P 07/26/2015 - PET declined by insurance- 1. Stable examinations demonstrating no evidence of metastatic endometrial carcinoma. 2. Stable widespread osseous findings attributed to multiple myeloma. No evidence of pathologic fracture or epidural tumor. 3. Grossly stable small left thyroid nodule. 4. Chronic bilateral femoral head avascular necrosis.  Interval history: Pap collected by Dr. Leo Grosser in October 2017 ASCUS positive for high risk HPV virus.   Colposcopy 12/30/2015  c/w  Vagina, biopsy, vaginal apex - LOW GRADE SQUAMOUS INTRAEPITHELIAL LESION, CIN-I (MILD DYSPLASIA). - NO MALIGNANCY IDENTIFIED.  S/P Co2 laser of the vagina 02/22/2016  Interval history: Reports spotting  with episodes of constipation.   Past Medical History:  Diagnosis Date  . Avascular necrosis of femoral head ([place])    bilateral per CT 07-26-2015  . Endometrial carcinoma [facility]) gyn oncologist-  dr Skeet Latch ([facility])/  radiation oncologist-- dr Sondra Come   2013 dx  FIGO Stage 1A, Grade 2 endometrioid endometrial cancer s/p TAH w/ BSO and bilateral pelvic node  dissection 10-31-2011 ;  recurrence at distal vagina 04/ 2014 s/p  brachytherapy (ended 07-29-2012);  2nd recurrence 12/ 2016  vaginal apex s/p  conformational radiotherapy 03-10-2015 to 04-20-2015  . GERD (gastroesophageal reflux disease)   . H/O stem cell transplant ([place])    02/ 2000 and second one 06/ 2016  . 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 History of radiation therapy 6/4, 6/11, 6/19, 6/25, 07/29/2012   vagina 30.5 gray in 5 fx, HDR brachytherapy:   last radiation to vagina 03-10-2015 to 04-20-2015  50.4gray  . History of radiation therapy 03/10/15-04/20/15   vagina 50.4 in 28 fractions  . Hypertension   . Lambda light chain myeloma [facility]) oncologist-  dr Marin Olp ([facility])  and  [facility] -- dr Salena Saner gasparetto   dx 07/ 1999 s/p  VAD chemotherapy 11/ 1999,  purged autotransplant 03-21-1998 followed by auto stem cell transplant 03-29-1998;  recurrance w/ second autologous stem cell transplant 07-24-2014;  in Re-mission currently , chemo maintenance therapy  . Osteoporosis 12/18/05   Increased  risk   . PONV (postoperative nausea and vomiting)   . Wears glasses      Past Surgical History:  Procedure Laterality Date  . ABDOMINAL HYSTERECTOMY  10/31/2011   Procedure: HYSTERECTOMY ABDOMINAL;  Surgeon: [name], MD PHD;  [facility];  Service: Gynecology;  Laterality: N/A;  . CO2 LASER APPLICATION N/A 04/28/5186   Procedure: CO2 LASER APPLICATION;  Surgeon: [name], MD;  Location: [facility];  Service: Gynecology;  Laterality: N/A;  . ECTOPIC PREGNANCY SURGERY  1992  . HYSTEROSCOPY W/D&C  09/27/2011   Procedure: DILATATION AND CURETTAGE /HYSTEROSCOPY;  Surgeon: Eldred Manges, MD;  Location: [facility];  Service: Gynecology;;  . LAPAROTOMY  10/31/2011   Procedure: EXPLORATORY LAPAROTOMY;  Surgeon: [name], MD PHD;  Location: [facility];  Service: Gynecology;  Laterality: N/A;  . SALPINGOOPHORECTOMY   10/31/2011   Procedure: SALPINGO OOPHERECTOMY;  Surgeon: [name], MD PHD;  Location: [facility];  Service: Gynecology;  Laterality: Bilateral;   Lymph Nodes sampling  . 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  They just purchased and moved to a larger home to accomodte their parents and visiting children grandchildren.   Family is well  Review of systems: Constitutional:  She has no fever or chills. No changes in weight.  Cardiovascular: No chest pain, palpitations or edema. Respiratory:  No shortness of breath, wheezing or cough Gastrointestinal: She has normal bowel movements without diarrhea or constipation. She denies any nausea or vomiting.   Genitourinary:  She denies pelvic pain, pelvic pressure or changes in her urinary function. + vaginal bleeding (light) with intercourse - unchanged Otherwise uninformative 10 point review of system   Physical Exam: Blood pressure 133/73, pulse 71, temperature 98.4 F (36.9 C), temperature source Oral, resp. rate 20, weight 194 lb (88 kg), SpO2 100 %. Wt Readings from Last 3 Encounters:  08/03/16 194 lb (88 kg)  07/06/16 195 lb 8 oz (88.7 kg)  07/03/16 192 lb 12 oz (87.4 kg)   General: Well dressed, well nourished in no apparent distress.   Abdomen:  Soft, nontender, nondistended.  No palpable masses.  No hepatosplenomegaly.  No  ascites.   No hernias.   Genitourinary: Normal EGBUS radiation changes at the vaginal apex.  Vagina foreshortened and narrowed.  Acetic acid applied, and the area was examined.  Aceto white changes noted at the cuff and biopsied.   Radiation changes present .   Chest:  CTA LN:  No cervical supraclavicular or inguinal adenopathy Rectal:  Deferred.  Back: No CVA tenderness

## 2016-08-03 NOTE — Patient Instructions (Signed)
We will call you with the results of the biopsy when available. Follow up 01-18-17 with Dr. Skeet Latch as scheduled.

## 2016-08-03 NOTE — Patient Instructions (Signed)
Bortezomib injection What is this medicine? BORTEZOMIB (bor TEZ oh mib) is a medicine that targets proteins in cancer cells and stops the cancer cells from growing. It is used to treat multiple myeloma and mantle-cell lymphoma. This medicine may be used for other purposes; ask your health care provider or pharmacist if you have questions. COMMON BRAND NAME(S): Velcade What should I tell my health care provider before I take this medicine? They need to know if you have any of these conditions: -diabetes -heart disease -irregular heartbeat -liver disease -on hemodialysis -low blood counts, like low white blood cells, platelets, or hemoglobin -peripheral neuropathy -taking medicine for blood pressure -an unusual or allergic reaction to bortezomib, mannitol, boron, other medicines, foods, dyes, or preservatives -pregnant or trying to get pregnant -breast-feeding How should I use this medicine? This medicine is for injection into a vein or for injection under the skin. It is given by a health care professional in a hospital or clinic setting. Talk to your pediatrician regarding the use of this medicine in children. Special care may be needed. Overdosage: If you think you have taken too much of this medicine contact a poison control center or emergency room at once. NOTE: This medicine is only for you. Do not share this medicine with others. What if I miss a dose? It is important not to miss your dose. Call your doctor or health care professional if you are unable to keep an appointment. What may interact with this medicine? This medicine may interact with the following medications: -ketoconazole -rifampin -ritonavir -St. John's Wort This list may not describe all possible interactions. Give your health care provider a list of all the medicines, herbs, non-prescription drugs, or dietary supplements you use. Also tell them if you smoke, drink alcohol, or use illegal drugs. Some items may  interact with your medicine. What should I watch for while using this medicine? You may get drowsy or dizzy. Do not drive, use machinery, or do anything that needs mental alertness until you know how this medicine affects you. Do not stand or sit up quickly, especially if you are an older patient. This reduces the risk of dizzy or fainting spells. In some cases, you may be given additional medicines to help with side effects. Follow all directions for their use. Call your doctor or health care professional for advice if you get a fever, chills or sore throat, or other symptoms of a cold or flu. Do not treat yourself. This drug decreases your body's ability to fight infections. Try to avoid being around people who are sick. This medicine may increase your risk to bruise or bleed. Call your doctor or health care professional if you notice any unusual bleeding. You may need blood work done while you are taking this medicine. In some patients, this medicine may cause a serious brain infection that may cause death. If you have any problems seeing, thinking, speaking, walking, or standing, tell your doctor right away. If you cannot reach your doctor, urgently seek other source of medical care. Check with your doctor or health care professional if you get an attack of severe diarrhea, nausea and vomiting, or if you sweat a lot. The loss of too much body fluid can make it dangerous for you to take this medicine. Do not become pregnant while taking this medicine or for at least 2 months after stopping it. Women should inform their doctor if they wish to become pregnant or think they might be pregnant. Men should not  father a child while taking this medicine and for at least 2 months after stopping it. There is a potential for serious side effects to an unborn child. Talk to your health care professional or pharmacist for more information. Do not breast-feed an infant while taking this medicine or for 2 months after  stopping it. This medicine may interfere with the ability to have a child. You should talk with your doctor or health care professional if you are concerned about your fertility. What side effects may I notice from receiving this medicine? Side effects that you should report to your doctor or health care professional as soon as possible: -allergic reactions like skin rash, itching or hives, swelling of the face, lips, or tongue -breathing problems -changes in hearing -changes in vision -fast, irregular heartbeat -feeling faint or lightheaded, falls -pain, tingling, numbness in the hands or feet -right upper belly pain -seizures -swelling of the ankles, feet, hands -unusual bleeding or bruising -unusually weak or tired -vomiting -yellowing of the eyes or skin Side effects that usually do not require medical attention (report to your doctor or health care professional if they continue or are bothersome): -changes in emotions or moods -constipation -diarrhea -loss of appetite -headache -irritation at site where injected -nausea This list may not describe all possible side effects. Call your doctor for medical advice about side effects. You may report side effects to FDA at 1-800-FDA-1088. Where should I keep my medicine? This drug is given in a hospital or clinic and will not be stored at home. NOTE: This sheet is a summary. It may not cover all possible information. If you have questions about this medicine, talk to your doctor, pharmacist, or health care provider.  2018 Elsevier/Gold Standard (2015-12-16 15:53:51) Denosumab injection What is this medicine? DENOSUMAB (den oh sue mab) slows bone breakdown. Prolia is used to treat osteoporosis in women after menopause and in men. Xgeva is used to treat a high calcium level due to cancer and to prevent bone fractures and other bone problems caused by multiple myeloma or cancer bone metastases. Xgeva is also used to treat giant cell tumor  of the bone. This medicine may be used for other purposes; ask your health care provider or pharmacist if you have questions. COMMON BRAND NAME(S): Prolia, XGEVA What should I tell my health care provider before I take this medicine? They need to know if you have any of these conditions: -dental disease -having surgery or tooth extraction -infection -kidney disease -low levels of calcium or Vitamin D in the blood -malnutrition -on hemodialysis -skin conditions or sensitivity -thyroid or parathyroid disease -an unusual reaction to denosumab, other medicines, foods, dyes, or preservatives -pregnant or trying to get pregnant -breast-feeding How should I use this medicine? This medicine is for injection under the skin. It is given by a health care professional in a hospital or clinic setting. If you are getting Prolia, a special MedGuide will be given to you by the pharmacist with each prescription and refill. Be sure to read this information carefully each time. For Prolia, talk to your pediatrician regarding the use of this medicine in children. Special care may be needed. For Xgeva, talk to your pediatrician regarding the use of this medicine in children. While this drug may be prescribed for children as young as 13 years for selected conditions, precautions do apply. Overdosage: If you think you have taken too much of this medicine contact a poison control center or emergency room at once. NOTE: This   medicine is only for you. Do not share this medicine with others. What if I miss a dose? It is important not to miss your dose. Call your doctor or health care professional if you are unable to keep an appointment. What may interact with this medicine? Do not take this medicine with any of the following medications: -other medicines containing denosumab This medicine may also interact with the following medications: -medicines that lower your chance of fighting infection -steroid medicines  like prednisone or cortisone This list may not describe all possible interactions. Give your health care provider a list of all the medicines, herbs, non-prescription drugs, or dietary supplements you use. Also tell them if you smoke, drink alcohol, or use illegal drugs. Some items may interact with your medicine. What should I watch for while using this medicine? Visit your doctor or health care professional for regular checks on your progress. Your doctor or health care professional may order blood tests and other tests to see how you are doing. Call your doctor or health care professional for advice if you get a fever, chills or sore throat, or other symptoms of a cold or flu. Do not treat yourself. This drug may decrease your body's ability to fight infection. Try to avoid being around people who are sick. You should make sure you get enough calcium and vitamin D while you are taking this medicine, unless your doctor tells you not to. Discuss the foods you eat and the vitamins you take with your health care professional. See your dentist regularly. Brush and floss your teeth as directed. Before you have any dental work done, tell your dentist you are receiving this medicine. Do not become pregnant while taking this medicine or for 5 months after stopping it. Talk with your doctor or health care professional about your birth control options while taking this medicine. Women should inform their doctor if they wish to become pregnant or think they might be pregnant. There is a potential for serious side effects to an unborn child. Talk to your health care professional or pharmacist for more information. What side effects may I notice from receiving this medicine? Side effects that you should report to your doctor or health care professional as soon as possible: -allergic reactions like skin rash, itching or hives, swelling of the face, lips, or tongue -bone pain -breathing problems -dizziness -jaw  pain, especially after dental work -redness, blistering, peeling of the skin -signs and symptoms of infection like fever or chills; cough; sore throat; pain or trouble passing urine -signs of low calcium like fast heartbeat, muscle cramps or muscle pain; pain, tingling, numbness in the hands or feet; seizures -unusual bleeding or bruising -unusually weak or tired Side effects that usually do not require medical attention (report to your doctor or health care professional if they continue or are bothersome): -constipation -diarrhea -headache -joint pain -loss of appetite -muscle pain -runny nose -tiredness -upset stomach This list may not describe all possible side effects. Call your doctor for medical advice about side effects. You may report side effects to FDA at 1-800-FDA-1088. Where should I keep my medicine? This medicine is only given in a clinic, doctor's office, or other health care setting and will not be stored at home. NOTE: This sheet is a summary. It may not cover all possible information. If you have questions about this medicine, talk to your doctor, pharmacist, or health care provider.  2018 Elsevier/Gold Standard (2016-02-08 19:17:21)  

## 2016-08-03 NOTE — Progress Notes (Signed)
Hematology and Oncology Follow Up Visit  GESENIA BANTZ [id number] [birth year] 66 y.o. 08/03/2016   Principle Diagnosis:   Recurrent lambda light chain myeloma  History of recurrent endometrial carcinoma  Current Therapy:    Status post second autologous stem cell transplant on 07/24/2014  Maintenance therapy with Pomalidomide/every 2 week Velcade  Xgeva 120 mg subcutaneous every 3 months-start in April 2018  Radiation therapy for endometrial recurrence - completed 04/20/2015  Femara 2.'5mg'$  po q day     Interim History:  Ms.  Gillingham is back for followup. She is doing pretty well. She has had no specific complaints.   She comes in with her husband. They have been doing pretty well. They are going down to [place] quite a bit as both parents lived down there.  We have been testing her light chain levels. I have noticed that they have been gradually going up. Her lambda light chains in May Rupp to 5.8 mg/dL. Back in March there were 3.1 mg/dL.  Her 24-hour urine that was done in June showed her light chains to go from 13 mg per day up to 34 mg per day.  I think we probably are going to have to do a bone marrow biopsy on her to see exactly what is going on. Her last bone marrow test was done about 3 years ago.  Her appetite is good. There is no nausea or vomiting. She has no change in bowel or bladder habits. As far she knows, her recurrent endometrial cancer has been doing well. Of note, her endometrial cancer is MMR deficient.  She's had no fever. There's been no leg swelling.  She and her husband had a very quiet July 4th holiday.  Overall, I see that her performance status is ECOG 1.  .  Medications: Allergies:  Allergies  Allergen Reactions  . Codeine Nausea Only    Past Medical History, Surgical history, Social history, and Family History were reviewed and updated.  Review of Systems: As above  Physical Exam:  weight is 194 lb (88 kg). Her oral  temperature is 98.6 F (37 C). Her blood pressure is 133/64 and her pulse is 87. Her respiration is 17 and oxygen saturation is 100%.   Well-developed and well-nourished [name]. Head and neck exam shows no ocular or oral lesion. There are no palpable cervical or supraclavicular lymph nodes. Lungs are clear. Cardiac exam regular rate and rhythm with no murmurs rubs or bruits. Abdomen is soft. She has good bowel sounds. There is no fluid wave. There is no palpable liver or spleen tip. Extremities shows no clubbing, cyanosis or edema. Neurological exam shows no focal neurological deficits. Skin exam no rashes, ecchymosis or petechia.  Lab Results  Component Value Date   WBC 3.4 (L) 08/03/2016   HGB 10.9 (L) 08/03/2016   HCT 33.4 (L) 08/03/2016   MCV 82 08/03/2016   PLT 226 08/03/2016     Chemistry      Component Value Date/Time   NA 139 08/03/2016 1441   NA 140 06/21/2016 0918   K 3.8 08/03/2016 1441   K 4.3 06/21/2016 0918   CL 103 08/03/2016 1441   CO2 26 08/03/2016 1441   CO2 20 (L) 06/21/2016 0918   BUN 14 08/03/2016 1441   BUN 15.8 06/21/2016 0918   CREATININE 1.0 08/03/2016 1441   CREATININE 0.8 06/21/2016 0918      Component Value Date/Time   CALCIUM 9.7 08/03/2016 1441   CALCIUM 9.4 06/21/2016 0918  ALKPHOS 57 08/03/2016 1441   ALKPHOS 66 06/21/2016 0918   AST 18 08/03/2016 1441   AST 17 06/21/2016 0918   ALT 23 08/03/2016 1441   ALT 37 06/21/2016 0918   BILITOT 0.60 08/03/2016 1441   BILITOT 0.32 06/21/2016 0918         Impression and Plan: Ms. Yousuf is 66 year old female. She has recurrent lambda light chain myeloma. She also had recurrent endometrial cancer. The recurrence was localized. She is being followed by OB/GYN and radiation oncology for this.   Of note, I suspect that we may have to consider changing her therapy. I would probably consider her for Daratumumab therapy. She has been on multiple chemotherapeutic courses. I think  Daratumumab would definitely be helpful and effective and well tolerated. Mild concern with it is just the protocol doing this weekly. She really needs to have flexibility in order to go down to help with her mother who lives in [place].  I spent about 35 minutes with she and her husband. We reviewed the labs. I talked about the bone marrow biopsy which I thought would be helpful.  I will like to see her back in about 2 or 3 weeks. By then, the bone marrow results should be back.   [name], MD 7/5/20183:37 PM

## 2016-08-09 ENCOUNTER — Telehealth: Payer: Self-pay

## 2016-08-09 NOTE — Telephone Encounter (Signed)
Told Emily Greer that the bx showed low grade squamous intraepithelial lesion, CIN-l(Mild dysplasia). No intervention needed at this time just close follow up per Meliss Cross,NP. Keep appointment as scheduled in 12-2016 as scheduled. Pt verbalized understanding.

## 2016-08-15 ENCOUNTER — Other Ambulatory Visit: Payer: Self-pay | Admitting: *Deleted

## 2016-08-16 ENCOUNTER — Other Ambulatory Visit (HOSPITAL_BASED_OUTPATIENT_CLINIC_OR_DEPARTMENT_OTHER): Payer: BC Managed Care – PPO

## 2016-08-16 ENCOUNTER — Other Ambulatory Visit: Payer: Self-pay | Admitting: Student

## 2016-08-16 ENCOUNTER — Ambulatory Visit: Payer: BC Managed Care – PPO | Admitting: Hematology & Oncology

## 2016-08-16 ENCOUNTER — Other Ambulatory Visit: Payer: Self-pay | Admitting: Radiology

## 2016-08-16 ENCOUNTER — Ambulatory Visit (HOSPITAL_BASED_OUTPATIENT_CLINIC_OR_DEPARTMENT_OTHER): Payer: BC Managed Care – PPO

## 2016-08-16 VITALS — BP 120/73 | HR 79 | Temp 98.3°F | Resp 20

## 2016-08-16 DIAGNOSIS — C541 Malignant neoplasm of endometrium: Secondary | ICD-10-CM

## 2016-08-16 DIAGNOSIS — Z5112 Encounter for antineoplastic immunotherapy: Secondary | ICD-10-CM

## 2016-08-16 DIAGNOSIS — C9001 Multiple myeloma in remission: Secondary | ICD-10-CM

## 2016-08-16 DIAGNOSIS — C9000 Multiple myeloma not having achieved remission: Secondary | ICD-10-CM

## 2016-08-16 LAB — CBC WITH DIFFERENTIAL (CANCER CENTER ONLY)
BASO#: 0.1 10*3/uL (ref 0.0–0.2)
BASO%: 2.3 % — ABNORMAL HIGH (ref 0.0–2.0)
EOS%: 1.5 % (ref 0.0–7.0)
Eosinophils Absolute: 0.1 10*3/uL (ref 0.0–0.5)
HCT: 32.5 % — ABNORMAL LOW (ref 34.8–46.6)
HGB: 10.7 g/dL — ABNORMAL LOW (ref 11.6–15.9)
LYMPH#: 1.6 10*3/uL (ref 0.9–3.3)
LYMPH%: 39.3 % (ref 14.0–48.0)
MCH: 26.9 pg (ref 26.0–34.0)
MCHC: 32.9 g/dL (ref 32.0–36.0)
MCV: 82 fL (ref 81–101)
MONO#: 0.4 10*3/uL (ref 0.1–0.9)
MONO%: 10.7 % (ref 0.0–13.0)
NEUT#: 1.8 10*3/uL (ref 1.5–6.5)
NEUT%: 46.2 % (ref 39.6–80.0)
Platelets: 262 10*3/uL (ref 145–400)
RBC: 3.98 10*6/uL (ref 3.70–5.32)
RDW: 17.6 % — ABNORMAL HIGH (ref 11.1–15.7)
WBC: 3.9 10*3/uL (ref 3.9–10.0)

## 2016-08-16 LAB — CMP (CANCER CENTER ONLY)
ALT(SGPT): 15 U/L (ref 10–47)
AST: 20 U/L (ref 11–38)
Albumin: 3.6 g/dL (ref 3.3–5.5)
Alkaline Phosphatase: 43 U/L (ref 26–84)
BUN, Bld: 18 mg/dL (ref 7–22)
CO2: 27 mEq/L (ref 18–33)
Calcium: 9.5 mg/dL (ref 8.0–10.3)
Chloride: 105 mEq/L (ref 98–108)
Creat: 1.2 mg/dl (ref 0.6–1.2)
Glucose, Bld: 127 mg/dL — ABNORMAL HIGH (ref 73–118)
Potassium: 3.4 mEq/L (ref 3.3–4.7)
Sodium: 136 mEq/L (ref 128–145)
Total Bilirubin: 0.6 mg/dl (ref 0.20–1.60)
Total Protein: 7.6 g/dL (ref 6.4–8.1)

## 2016-08-16 MED ORDER — ONDANSETRON HCL 8 MG PO TABS
8.0000 mg | ORAL_TABLET | Freq: Once | ORAL | Status: AC
  Administered 2016-08-16: 8 mg via ORAL

## 2016-08-16 MED ORDER — ONDANSETRON HCL 8 MG PO TABS
ORAL_TABLET | ORAL | Status: AC
  Filled 2016-08-16: qty 1

## 2016-08-16 MED ORDER — BORTEZOMIB CHEMO SQ INJECTION 3.5 MG (2.5MG/ML)
1.2000 mg/m2 | Freq: Once | INTRAMUSCULAR | Status: AC
  Administered 2016-08-16: 2.5 mg via SUBCUTANEOUS
  Filled 2016-08-16: qty 2.5

## 2016-08-16 MED ORDER — DENOSUMAB 120 MG/1.7ML ~~LOC~~ SOLN
120.0000 mg | Freq: Once | SUBCUTANEOUS | Status: AC
  Administered 2016-08-16: 120 mg via SUBCUTANEOUS
  Filled 2016-08-16: qty 1.7

## 2016-08-16 NOTE — Patient Instructions (Signed)
Bortezomib injection What is this medicine? BORTEZOMIB (bor TEZ oh mib) is a medicine that targets proteins in cancer cells and stops the cancer cells from growing. It is used to treat multiple myeloma and mantle-cell lymphoma. This medicine may be used for other purposes; ask your health care provider or pharmacist if you have questions. COMMON BRAND NAME(S): Velcade What should I tell my health care provider before I take this medicine? They need to know if you have any of these conditions: -diabetes -heart disease -irregular heartbeat -liver disease -on hemodialysis -low blood counts, like low white blood cells, platelets, or hemoglobin -peripheral neuropathy -taking medicine for blood pressure -an unusual or allergic reaction to bortezomib, mannitol, boron, other medicines, foods, dyes, or preservatives -pregnant or trying to get pregnant -breast-feeding How should I use this medicine? This medicine is for injection into a vein or for injection under the skin. It is given by a health care professional in a hospital or clinic setting. Talk to your pediatrician regarding the use of this medicine in children. Special care may be needed. Overdosage: If you think you have taken too much of this medicine contact a poison control center or emergency room at once. NOTE: This medicine is only for you. Do not share this medicine with others. What if I miss a dose? It is important not to miss your dose. Call your doctor or health care professional if you are unable to keep an appointment. What may interact with this medicine? This medicine may interact with the following medications: -ketoconazole -rifampin -ritonavir -St. John's Wort This list may not describe all possible interactions. Give your health care provider a list of all the medicines, herbs, non-prescription drugs, or dietary supplements you use. Also tell them if you smoke, drink alcohol, or use illegal drugs. Some items may  interact with your medicine. What should I watch for while using this medicine? You may get drowsy or dizzy. Do not drive, use machinery, or do anything that needs mental alertness until you know how this medicine affects you. Do not stand or sit up quickly, especially if you are an older patient. This reduces the risk of dizzy or fainting spells. In some cases, you may be given additional medicines to help with side effects. Follow all directions for their use. Call your doctor or health care professional for advice if you get a fever, chills or sore throat, or other symptoms of a cold or flu. Do not treat yourself. This drug decreases your body's ability to fight infections. Try to avoid being around people who are sick. This medicine may increase your risk to bruise or bleed. Call your doctor or health care professional if you notice any unusual bleeding. You may need blood work done while you are taking this medicine. In some patients, this medicine may cause a serious brain infection that may cause death. If you have any problems seeing, thinking, speaking, walking, or standing, tell your doctor right away. If you cannot reach your doctor, urgently seek other source of medical care. Check with your doctor or health care professional if you get an attack of severe diarrhea, nausea and vomiting, or if you sweat a lot. The loss of too much body fluid can make it dangerous for you to take this medicine. Do not become pregnant while taking this medicine or for at least 2 months after stopping it. Women should inform their doctor if they wish to become pregnant or think they might be pregnant. Men should not  father a child while taking this medicine and for at least 2 months after stopping it. There is a potential for serious side effects to an unborn child. Talk to your health care professional or pharmacist for more information. Do not breast-feed an infant while taking this medicine or for 2 months after  stopping it. This medicine may interfere with the ability to have a child. You should talk with your doctor or health care professional if you are concerned about your fertility. What side effects may I notice from receiving this medicine? Side effects that you should report to your doctor or health care professional as soon as possible: -allergic reactions like skin rash, itching or hives, swelling of the face, lips, or tongue -breathing problems -changes in hearing -changes in vision -fast, irregular heartbeat -feeling faint or lightheaded, falls -pain, tingling, numbness in the hands or feet -right upper belly pain -seizures -swelling of the ankles, feet, hands -unusual bleeding or bruising -unusually weak or tired -vomiting -yellowing of the eyes or skin Side effects that usually do not require medical attention (report to your doctor or health care professional if they continue or are bothersome): -changes in emotions or moods -constipation -diarrhea -loss of appetite -headache -irritation at site where injected -nausea This list may not describe all possible side effects. Call your doctor for medical advice about side effects. You may report side effects to FDA at 1-800-FDA-1088. Where should I keep my medicine? This drug is given in a hospital or clinic and will not be stored at home. NOTE: This sheet is a summary. It may not cover all possible information. If you have questions about this medicine, talk to your doctor, pharmacist, or health care provider.  2018 Elsevier/Gold Standard (2015-12-16 15:53:51) Denosumab injection What is this medicine? DENOSUMAB (den oh sue mab) slows bone breakdown. Prolia is used to treat osteoporosis in women after menopause and in men. Xgeva is used to treat a high calcium level due to cancer and to prevent bone fractures and other bone problems caused by multiple myeloma or cancer bone metastases. Xgeva is also used to treat giant cell tumor  of the bone. This medicine may be used for other purposes; ask your health care provider or pharmacist if you have questions. COMMON BRAND NAME(S): Prolia, XGEVA What should I tell my health care provider before I take this medicine? They need to know if you have any of these conditions: -dental disease -having surgery or tooth extraction -infection -kidney disease -low levels of calcium or Vitamin D in the blood -malnutrition -on hemodialysis -skin conditions or sensitivity -thyroid or parathyroid disease -an unusual reaction to denosumab, other medicines, foods, dyes, or preservatives -pregnant or trying to get pregnant -breast-feeding How should I use this medicine? This medicine is for injection under the skin. It is given by a health care professional in a hospital or clinic setting. If you are getting Prolia, a special MedGuide will be given to you by the pharmacist with each prescription and refill. Be sure to read this information carefully each time. For Prolia, talk to your pediatrician regarding the use of this medicine in children. Special care may be needed. For Xgeva, talk to your pediatrician regarding the use of this medicine in children. While this drug may be prescribed for children as young as 13 years for selected conditions, precautions do apply. Overdosage: If you think you have taken too much of this medicine contact a poison control center or emergency room at once. NOTE: This   medicine is only for you. Do not share this medicine with others. What if I miss a dose? It is important not to miss your dose. Call your doctor or health care professional if you are unable to keep an appointment. What may interact with this medicine? Do not take this medicine with any of the following medications: -other medicines containing denosumab This medicine may also interact with the following medications: -medicines that lower your chance of fighting infection -steroid medicines  like prednisone or cortisone This list may not describe all possible interactions. Give your health care provider a list of all the medicines, herbs, non-prescription drugs, or dietary supplements you use. Also tell them if you smoke, drink alcohol, or use illegal drugs. Some items may interact with your medicine. What should I watch for while using this medicine? Visit your doctor or health care professional for regular checks on your progress. Your doctor or health care professional may order blood tests and other tests to see how you are doing. Call your doctor or health care professional for advice if you get a fever, chills or sore throat, or other symptoms of a cold or flu. Do not treat yourself. This drug may decrease your body's ability to fight infection. Try to avoid being around people who are sick. You should make sure you get enough calcium and vitamin D while you are taking this medicine, unless your doctor tells you not to. Discuss the foods you eat and the vitamins you take with your health care professional. See your dentist regularly. Brush and floss your teeth as directed. Before you have any dental work done, tell your dentist you are receiving this medicine. Do not become pregnant while taking this medicine or for 5 months after stopping it. Talk with your doctor or health care professional about your birth control options while taking this medicine. Women should inform their doctor if they wish to become pregnant or think they might be pregnant. There is a potential for serious side effects to an unborn child. Talk to your health care professional or pharmacist for more information. What side effects may I notice from receiving this medicine? Side effects that you should report to your doctor or health care professional as soon as possible: -allergic reactions like skin rash, itching or hives, swelling of the face, lips, or tongue -bone pain -breathing problems -dizziness -jaw  pain, especially after dental work -redness, blistering, peeling of the skin -signs and symptoms of infection like fever or chills; cough; sore throat; pain or trouble passing urine -signs of low calcium like fast heartbeat, muscle cramps or muscle pain; pain, tingling, numbness in the hands or feet; seizures -unusual bleeding or bruising -unusually weak or tired Side effects that usually do not require medical attention (report to your doctor or health care professional if they continue or are bothersome): -constipation -diarrhea -headache -joint pain -loss of appetite -muscle pain -runny nose -tiredness -upset stomach This list may not describe all possible side effects. Call your doctor for medical advice about side effects. You may report side effects to FDA at 1-800-FDA-1088. Where should I keep my medicine? This medicine is only given in a clinic, doctor's office, or other health care setting and will not be stored at home. NOTE: This sheet is a summary. It may not cover all possible information. If you have questions about this medicine, talk to your doctor, pharmacist, or health care provider.  2018 Elsevier/Gold Standard (2016-02-08 19:17:21)  

## 2016-08-17 ENCOUNTER — Ambulatory Visit (HOSPITAL_COMMUNITY)
Admission: RE | Admit: 2016-08-17 | Discharge: 2016-08-17 | Disposition: A | Discharge status: Home / Self Care | Payer: BC Managed Care – PPO | Source: Ambulatory Visit | Attending: Hematology & Oncology | Admitting: Hematology & Oncology

## 2016-08-17 ENCOUNTER — Encounter (HOSPITAL_COMMUNITY): Payer: Self-pay

## 2016-08-17 DIAGNOSIS — Z79811 Long term (current) use of aromatase inhibitors: Secondary | ICD-10-CM | POA: Diagnosis not present

## 2016-08-17 DIAGNOSIS — K219 Gastro-esophageal reflux disease without esophagitis: Secondary | ICD-10-CM | POA: Diagnosis not present

## 2016-08-17 DIAGNOSIS — Z9221 Personal history of antineoplastic chemotherapy: Secondary | ICD-10-CM | POA: Diagnosis not present

## 2016-08-17 DIAGNOSIS — Z79899 Other long term (current) drug therapy: Secondary | ICD-10-CM | POA: Diagnosis not present

## 2016-08-17 DIAGNOSIS — Z7982 Long term (current) use of aspirin: Secondary | ICD-10-CM | POA: Insufficient documentation

## 2016-08-17 DIAGNOSIS — Z8542 Personal history of malignant neoplasm of other parts of uterus: Secondary | ICD-10-CM | POA: Insufficient documentation

## 2016-08-17 DIAGNOSIS — C9000 Multiple myeloma not having achieved remission: Secondary | ICD-10-CM | POA: Diagnosis present

## 2016-08-17 DIAGNOSIS — Z90722 Acquired absence of ovaries, bilateral: Secondary | ICD-10-CM | POA: Insufficient documentation

## 2016-08-17 DIAGNOSIS — Z800 Family history of malignant neoplasm of digestive organs: Secondary | ICD-10-CM | POA: Insufficient documentation

## 2016-08-17 DIAGNOSIS — M810 Age-related osteoporosis without current pathological fracture: Secondary | ICD-10-CM | POA: Insufficient documentation

## 2016-08-17 DIAGNOSIS — Z90710 Acquired absence of both cervix and uterus: Secondary | ICD-10-CM | POA: Insufficient documentation

## 2016-08-17 DIAGNOSIS — Z885 Allergy status to narcotic agent status: Secondary | ICD-10-CM | POA: Diagnosis not present

## 2016-08-17 DIAGNOSIS — Z923 Personal history of irradiation: Secondary | ICD-10-CM | POA: Diagnosis not present

## 2016-08-17 DIAGNOSIS — I10 Essential (primary) hypertension: Secondary | ICD-10-CM | POA: Insufficient documentation

## 2016-08-17 DIAGNOSIS — D649 Anemia, unspecified: Secondary | ICD-10-CM | POA: Diagnosis not present

## 2016-08-17 DIAGNOSIS — Z9484 Stem cells transplant status: Secondary | ICD-10-CM | POA: Diagnosis not present

## 2016-08-17 LAB — KAPPA/LAMBDA LIGHT CHAINS
Ig Kappa Free Light Chain: 17.7 mg/L (ref 3.3–19.4)
Ig Lambda Free Light Chain: 68 mg/L — ABNORMAL HIGH (ref 5.7–26.3)
Kappa/Lambda FluidC Ratio: 0.26 (ref 0.26–1.65)

## 2016-08-17 LAB — PROTIME-INR
INR: 0.94
Prothrombin Time: 12.5 seconds (ref 11.4–15.2)

## 2016-08-17 LAB — IGG, IGA, IGM
IgA, Qn, Serum: 170 mg/dL (ref 87–352)
IgG, Qn, Serum: 1209 mg/dL (ref 700–1600)
IgM, Qn, Serum: 22 mg/dL — ABNORMAL LOW (ref 26–217)

## 2016-08-17 LAB — CBC
HCT: 31.8 % — ABNORMAL LOW (ref 36.0–46.0)
Hemoglobin: 10.5 g/dL — ABNORMAL LOW (ref 12.0–15.0)
MCH: 26.3 pg (ref 26.0–34.0)
MCHC: 33 g/dL (ref 30.0–36.0)
MCV: 79.5 fL (ref 78.0–100.0)
Platelets: 263 10*3/uL (ref 150–400)
RBC: 4 MIL/uL (ref 3.87–5.11)
RDW: 17.8 % — ABNORMAL HIGH (ref 11.5–15.5)
WBC: 4.7 10*3/uL (ref 4.0–10.5)

## 2016-08-17 LAB — APTT: aPTT: 25 seconds (ref 24–36)

## 2016-08-17 MED ORDER — MIDAZOLAM HCL 2 MG/2ML IJ SOLN
INTRAMUSCULAR | Status: AC
  Filled 2016-08-17: qty 4

## 2016-08-17 MED ORDER — FENTANYL CITRATE (PF) 100 MCG/2ML IJ SOLN
INTRAMUSCULAR | Status: AC | PRN
  Administered 2016-08-17 (×2): 50 ug via INTRAVENOUS

## 2016-08-17 MED ORDER — SODIUM CHLORIDE 0.9 % IV SOLN
INTRAVENOUS | Status: DC
  Administered 2016-08-17: 08:00:00 via INTRAVENOUS

## 2016-08-17 MED ORDER — MIDAZOLAM HCL 2 MG/2ML IJ SOLN
INTRAMUSCULAR | Status: AC | PRN
  Administered 2016-08-17 (×2): 1 mg via INTRAVENOUS

## 2016-08-17 MED ORDER — LIDOCAINE HCL 2 % IJ SOLN
INTRAMUSCULAR | Status: DC | PRN
  Administered 2016-08-17: 10 mL

## 2016-08-17 MED ORDER — FENTANYL CITRATE (PF) 100 MCG/2ML IJ SOLN
INTRAMUSCULAR | Status: AC
  Filled 2016-08-17: qty 4

## 2016-08-17 NOTE — Procedures (Signed)
Interventional Radiology Procedure Note  Procedure: CT guided aspirate and core biopsy of right iliac bone Complications: None Recommendations: - Bedrest supine x 1 hrs - Follow biopsy results  Enzley Kitchens T. Aika Brzoska, M.D Pager:  [phone]   

## 2016-08-17 NOTE — Discharge Instructions (Signed)
Bone Marrow Aspiration and Bone Marrow Biopsy, Adult, Care After °This sheet gives you information about how to care for yourself after your procedure. Your health care provider may also give you more specific instructions. If you have problems or questions, contact your health care provider. °What can I expect after the procedure? °After the procedure, it is common to have: °· Mild pain and tenderness. °· Swelling. °· Bruising. ° °Follow these instructions at home: °· Take over-the-counter or prescription medicines only as told by your health care provider. °· Do not take baths, swim, or use a hot tub until your health care provider approves. Ask if you can take a shower or have a sponge bath. °· Follow instructions from your health care provider about how to take care of the puncture site. Make sure you: °? Wash your hands with soap and water before you change your bandage (dressing). If soap and water are not available, use hand sanitizer. °? Change your dressing as told by your health care provider. °· Check your puncture site every day for signs of infection. Check for: °? More redness, swelling, or pain. °? More fluid or blood. °? Warmth. °? Pus or a bad smell. °· Return to your normal activities as told by your health care provider. Ask your health care provider what activities are safe for you. °· Do not drive for 24 hours if you were given a medicine to help you relax (sedative). °· Keep all follow-up visits as told by your health care provider. This is important. °Contact a health care provider if: °· You have more redness, swelling, or pain around the puncture site. °· You have more fluid or blood coming from the puncture site. °· Your puncture site feels warm to the touch. °· You have pus or a bad smell coming from the puncture site. °· You have a fever. °· Your pain is not controlled with medicine. °This information is not intended to replace advice given to you by your health care provider. Make sure  you discuss any questions you have with your health care provider. °Document Released: 08/05/2004 Document Revised: 08/06/2015 Document Reviewed: 06/30/2015 °Elsevier Interactive Patient Education © 2018 [employer] ° °

## 2016-08-17 NOTE — H&P (Signed)
Chief Complaint: Recurrent lambda light chain myeloma History of recurrent endometrial carcinoma  Referring Physician(s): Ennever,Peter R  Supervising Physician: Aletta Edouard  Patient Status: [facility] - Out-pt  History of Present Illness: Emily Greer is a 66 y.o. female with recurrent lambda light chain myeloma.   She also had recurrent endometrial cancer and is followed by GYN for this.  Dr. Marin Olp has been testing her light chain levels and they have been gradually going up.  She is here today for a bone marrow biopsy.  She is NPO. She does not take blood thinners.  Past Medical History:  Diagnosis Date  . Avascular necrosis of femoral head ([place])    bilateral per CT 07-26-2015  . Endometrial carcinoma [facility]) gyn oncologist-  dr Skeet Latch ([facility])/  radiation oncologist-- dr Sondra Come   2013 dx  FIGO Stage 1A, Grade 2 endometrioid endometrial cancer s/p TAH w/ BSO and bilateral pelvic node dissection 10-31-2011 ;  recurrence at distal vagina 04/ 2014 s/p  brachytherapy (ended 07-29-2012);  2nd recurrence 12/ 2016  vaginal apex s/p  conformational radiotherapy 03-10-2015 to 04-20-2015  . GERD (gastroesophageal reflux disease)   . H/O stem cell transplant ([place])    02/ 2000 and second one 06/ 2016  . 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 History of radiation therapy 6/4, 6/11, 6/19, 6/25, 07/29/2012   vagina 30.5 gray in 5 fx, HDR brachytherapy:   last radiation to vagina 03-10-2015 to 04-20-2015  50.4gray  . History of radiation therapy 03/10/15-04/20/15   vagina 50.4 in 28 fractions  . Hypertension   . Lambda light chain myeloma [facility]) oncologist-  dr Marin Olp ([facility])  and  [facility] -- dr Salena Saner gasparetto   dx 07/ 1999 s/p  VAD chemotherapy 11/ 1999,  purged autotransplant 03-21-1998 followed by auto stem cell transplant 03-29-1998;  recurrance w/ second autologous stem cell transplant 07-24-2014;  in  Re-mission currently , chemo maintenance therapy  . Osteoporosis 12/18/05   Increased  risk   . PONV (postoperative nausea and vomiting)   . Wears glasses     Past Surgical History:  Procedure Laterality Date  . ABDOMINAL HYSTERECTOMY  10/31/2011   Procedure: HYSTERECTOMY ABDOMINAL;  Surgeon: [name], MD PHD;  Location: [facility];  Service: Gynecology;  Laterality: N/A;  . CO2 LASER APPLICATION N/A 5/37/9432   Procedure: CO2 LASER APPLICATION;  Surgeon: [name], MD;  Location: [facility];  Service: Gynecology;  Laterality: N/A;  . ECTOPIC PREGNANCY SURGERY  1992  . HYSTEROSCOPY W/D&C  09/27/2011   Procedure: DILATATION AND CURETTAGE /HYSTEROSCOPY;  Surgeon: Eldred Manges, MD;  Location: [facility];  Service: Gynecology;;  . LAPAROTOMY  10/31/2011   Procedure: EXPLORATORY LAPAROTOMY;  Surgeon: [name], MD PHD;  Location: [facility];  Service: Gynecology;  Laterality: N/A;  . SALPINGOOPHORECTOMY  10/31/2011   Procedure: SALPINGO OOPHERECTOMY;  Surgeon: [name], MD PHD;  Location: [facility];  Service: Gynecology;  Laterality: Bilateral;   Lymph Nodes sampling  . TUBAL LIGATION  1986    Allergies: Codeine  Medications: Prior to Admission medications   Medication Sig Start Date End Date Taking? Authorizing Provider  acyclovir (ZOVIRAX) 400 MG tablet Take 1 tablet (400 mg total) by mouth 2 (two) times daily. 04/27/16  Yes [name], MD  amLODipine (NORVASC) 10 MG tablet TAKE 1 TABLET BY MOUTH DAILY--- takes in am 10/16/14  Yes [provider]  aspirin  EC 81 MG tablet Take 81 mg by mouth 2 (two) times daily.   Yes [provider]  bortezomib IV (VELCADE) 3.5 MG injection Inject 1.3 mg/m2 into the vein once. Patient is taking 2.5 MG injection every other week   Yes [provider]  carvedilol (COREG) 12.5 MG tablet TK 1 T PO BID 07/18/16  Yes [provider]  Cholecalciferol (VITAMIN D3) 2000 UNITS TABS Take 1 tablet by  mouth daily.   Yes [provider]  letrozole (FEMARA) 2.5 MG tablet Take 1 tablet (2.5 mg total) by mouth daily. 05/20/15  Yes [name], MD  loratadine (CLARITIN) 10 MG tablet Take 10 mg by mouth every morning. Reported on 08/19/2015   Yes [provider]  ondansetron (ZOFRAN) 8 MG tablet Take 8 mg by mouth every 8 (eight) hours as needed for nausea or vomiting. Reported on 08/19/2015--- takes prior to velcade injection at cancer center every other day and during   Yes [provider]  oxymetazoline (AFRIN) 0.05 % nasal spray Place 1 spray into both nostrils 2 (two) times daily as needed. Reported on 01/29/2015   Yes [provider]  pomalidomide (POMALYST) 1 MG capsule TAKE 1 CAPSULE BY MOUTH ONCE DAILY FOR 21 DAYS ON AND 7 DAYS OFF. [facility]#[id number] 07/26/16  Yes [name], MD  potassium chloride SA (K-DUR,KLOR-CON) 20 MEQ tablet TAKE 1 TABLET(20 MEQ) BY MOUTH TWICE DAILY 07/03/16  Yes Ennever, Rudell Cobb, MD  triamterene-hydrochlorothiazide (MAXZIDE-25) 37.5-25 MG tablet TK 1 T PO QD 07/25/16  Yes [provider]  albuterol (PROVENTIL HFA;VENTOLIN HFA) 108 (90 BASE) MCG/ACT inhaler Inhale 2 puffs into the lungs every 6 (six) hours as needed for wheezing or shortness of breath. 2 puffs 3 times daily x 5 days then every 6 hours as needed. 01/01/15   Eugenie Filler, MD  amoxicillin (AMOXIL) 500 MG tablet Take 500 mg by mouth See admin instructions. 4 tablets before dental procedures.    [provider]  loperamide (IMODIUM) 2 MG capsule Take by mouth as needed for diarrhea or loose stools. Reported on 08/19/2015    [provider]  valsartan (DIOVAN) 160 MG tablet Take 160 mg by mouth daily. 05/16/16   [provider]     Family History  Problem Relation Age of Onset  . Colon cancer Mother     Social History   Social History  . Marital status: Married    Spouse name: N/A  . Number of children: N/A  . Years of  education: N/A   Social History Main Topics  . Smoking status: Never Smoker  . Smokeless tobacco: Never Used  . Alcohol use 0.0 oz/week     Comment: occasional  . Drug use: No  . Sexual activity: Not Asked   Other Topics Concern  . None   Social History Narrative  . None    Review of Systems: A 12 point ROS discussed Review of Systems  Constitutional: Negative.   HENT: Negative.   Respiratory: Negative.   Cardiovascular: Negative.   Gastrointestinal: Negative.   Genitourinary: Negative.   Musculoskeletal: Negative.   Skin: Negative.   Neurological: Negative.   Hematological: Negative.   Psychiatric/Behavioral: Negative.     Vital Signs: BP 131/80 (BP Location: Right Arm)   Pulse 84   Temp 98.8 F (37.1 C) (Oral)   Resp 18   Ht _0  (1.626 m)   Wt 193 lb (87.5 kg)   SpO2 98%   BMI 33.13  kg/m   Physical Exam  Constitutional: She is oriented to person, place, and time. She appears well-developed.  HENT:  Head: Normocephalic and atraumatic.  Eyes: EOM are normal.  Neck: Normal range of motion.  Cardiovascular: Normal rate, regular rhythm and normal heart sounds.   Pulmonary/Chest: Effort normal and breath sounds normal. No respiratory distress. She has no wheezes.  Abdominal: Soft. She exhibits no distension. There is no tenderness.  Musculoskeletal: Normal range of motion.  Neurological: She is alert and oriented to person, place, and time.  Skin: Skin is warm and dry.  Psychiatric: She has a normal mood and affect. Her behavior is normal. Judgment and thought content normal.  Vitals reviewed.   Mallampati Score:  MD Evaluation Airway: WNL Heart: WNL Abdomen: WNL Chest/ Lungs: WNL ASA  Classification: 3 Mallampati/Airway Score: Two  Imaging: No results found.  Labs:  CBC:  Recent Labs  07/20/16 1435 08/03/16 1441 08/16/16 0939 08/17/16 0716  WBC 3.0* 3.4* 3.9 4.7  HGB 9.9* 10.9* 10.7* 10.5*  HCT 30.1* 33.4* 32.5* 31.8*  PLT 254 Few  Large platelets present 226 262 263    COAGS:  Recent Labs  08/17/16 0716  INR 0.94  APTT 25    BMP:  Recent Labs  07/03/16 0849 07/20/16 1435 08/03/16 1441 08/16/16 0939  NA 136 139 139 136  K 2.6* 3.6 3.8 3.4  CL 104 111* 103 105  CO2 _0 GLUCOSE 103 99 101 127*  BUN _1 CALCIUM 8.9 9.3 9.7 9.5  CREATININE 1.0 0.9 1.0 1.2    LIVER FUNCTION TESTS:  Recent Labs  07/03/16 0849 07/20/16 1435 08/03/16 1441 08/16/16 0939  BILITOT 0.60 0.60 0.60 0.60  AST _2 ALT _3 ALKPHOS 75 69 57 43  PROT 7.7 6.4  6.9 7.5 7.6  ALBUMIN 3.2* 3.5 3.4 3.6    TUMOR MARKERS: No results for input(s): AFPTM, CEA, CA199, CHROMGRNA in the last 8760 hours.  Assessment and Plan:  Recurrent lambda light chain myeloma  Will proceed with bone marrow biopsy today by Dr. Kathlene Cote.  Risks and Benefits discussed with the patient including, but not limited to bleeding, infection, damage to adjacent structures or low yield requiring additional tests.  All of the patient's questions were answered, patient is agreeable to proceed. Consent signed and in chart.  Thank you for this interesting consult.  I greatly enjoyed meeting TAYLA PANOZZO and look forward to participating in their care.  A copy of this report was sent to the requesting provider on this date.  Electronically Signed: Murrell Redden, PA-C 08/17/2016, 8:35 AM   I spent a total of  30 Minutes in face to face in clinical consultation, greater than 50% of which was counseling/coordinating care for bone marrow biopsy

## 2016-08-21 LAB — PROTEIN ELECTROPHORESIS, SERUM, WITH REFLEX
A/G Ratio: 1 (ref 0.7–1.7)
Albumin: 3.6 g/dL (ref 2.9–4.4)
Alpha 1: 0.2 g/dL (ref 0.0–0.4)
Alpha 2: 0.9 g/dL (ref 0.4–1.0)
Beta: 1.3 g/dL (ref 0.7–1.3)
Gamma Globulin: 1.1 g/dL (ref 0.4–1.8)
Globulin, Total: 3.5 g/dL (ref 2.2–3.9)
Total Protein: 7.1 g/dL (ref 6.0–8.5)

## 2016-08-29 ENCOUNTER — Other Ambulatory Visit: Payer: Self-pay

## 2016-08-29 DIAGNOSIS — C9001 Multiple myeloma in remission: Secondary | ICD-10-CM

## 2016-08-30 ENCOUNTER — Ambulatory Visit: Payer: BC Managed Care – PPO

## 2016-08-30 ENCOUNTER — Ambulatory Visit (HOSPITAL_BASED_OUTPATIENT_CLINIC_OR_DEPARTMENT_OTHER): Payer: BC Managed Care – PPO

## 2016-08-30 ENCOUNTER — Ambulatory Visit (HOSPITAL_BASED_OUTPATIENT_CLINIC_OR_DEPARTMENT_OTHER): Payer: BC Managed Care – PPO | Admitting: Hematology & Oncology

## 2016-08-30 ENCOUNTER — Other Ambulatory Visit (HOSPITAL_BASED_OUTPATIENT_CLINIC_OR_DEPARTMENT_OTHER): Payer: BC Managed Care – PPO

## 2016-08-30 ENCOUNTER — Other Ambulatory Visit: Payer: Self-pay | Admitting: Hematology & Oncology

## 2016-08-30 VITALS — BP 137/67 | HR 72 | Temp 98.6°F | Resp 16 | Wt 198.0 lb

## 2016-08-30 DIAGNOSIS — C9000 Multiple myeloma not having achieved remission: Secondary | ICD-10-CM

## 2016-08-30 DIAGNOSIS — Z5112 Encounter for antineoplastic immunotherapy: Secondary | ICD-10-CM | POA: Diagnosis not present

## 2016-08-30 DIAGNOSIS — C9001 Multiple myeloma in remission: Secondary | ICD-10-CM

## 2016-08-30 LAB — CBC WITH DIFFERENTIAL (CANCER CENTER ONLY)
BASO#: 0.1 10*3/uL (ref 0.0–0.2)
BASO%: 1.1 % (ref 0.0–2.0)
EOS%: 3.4 % (ref 0.0–7.0)
Eosinophils Absolute: 0.2 10*3/uL (ref 0.0–0.5)
HCT: 32.4 % — ABNORMAL LOW (ref 34.8–46.6)
HGB: 10.7 g/dL — ABNORMAL LOW (ref 11.6–15.9)
LYMPH#: 1.3 10*3/uL (ref 0.9–3.3)
LYMPH%: 28 % (ref 14.0–48.0)
MCH: 27.1 pg (ref 26.0–34.0)
MCHC: 33 g/dL (ref 32.0–36.0)
MCV: 82 fL (ref 81–101)
MONO#: 0.7 10*3/uL (ref 0.1–0.9)
MONO%: 15.4 % — ABNORMAL HIGH (ref 0.0–13.0)
NEUT#: 2.3 10*3/uL (ref 1.5–6.5)
NEUT%: 52.1 % (ref 39.6–80.0)
Platelets: 241 10*3/uL (ref 145–400)
RBC: 3.95 10*6/uL (ref 3.70–5.32)
RDW: 18 % — ABNORMAL HIGH (ref 11.1–15.7)
WBC: 4.5 10*3/uL (ref 3.9–10.0)

## 2016-08-30 LAB — CMP (CANCER CENTER ONLY)
ALT(SGPT): 24 U/L (ref 10–47)
AST: 21 U/L (ref 11–38)
Albumin: 3.7 g/dL (ref 3.3–5.5)
Alkaline Phosphatase: 40 U/L (ref 26–84)
BUN, Bld: 13 mg/dL (ref 7–22)
CO2: 29 mEq/L (ref 18–33)
Calcium: 9.7 mg/dL (ref 8.0–10.3)
Chloride: 100 mEq/L (ref 98–108)
Creat: 1.2 mg/dl (ref 0.6–1.2)
Glucose, Bld: 93 mg/dL (ref 73–118)
Potassium: 4.7 mEq/L (ref 3.3–4.7)
Sodium: 139 mEq/L (ref 128–145)
Total Bilirubin: 0.7 mg/dl (ref 0.20–1.60)
Total Protein: 7.3 g/dL (ref 6.4–8.1)

## 2016-08-30 MED ORDER — ONDANSETRON HCL 8 MG PO TABS
ORAL_TABLET | ORAL | Status: AC
  Filled 2016-08-30: qty 1

## 2016-08-30 MED ORDER — BORTEZOMIB CHEMO SQ INJECTION 3.5 MG (2.5MG/ML)
1.2000 mg/m2 | Freq: Once | INTRAMUSCULAR | Status: AC
  Administered 2016-08-30: 2.5 mg via SUBCUTANEOUS
  Filled 2016-08-30: qty 2.5

## 2016-08-30 MED ORDER — ONDANSETRON HCL 8 MG PO TABS
8.0000 mg | ORAL_TABLET | Freq: Once | ORAL | Status: AC
  Administered 2016-08-30: 8 mg via ORAL

## 2016-08-30 NOTE — Progress Notes (Signed)
Hematology and Oncology Follow Up Visit  LAKECIA DESCHAMPS [id number] 1950-09-09 66 y.o. 08/30/2016   Principle Diagnosis:   Recurrent lambda light chain myeloma  History of recurrent endometrial carcinoma  Current Therapy:    Status post second autologous stem cell transplant on 07/24/2014  Maintenance therapy with Pomalidomide/every 2 week Velcade  Xgeva 120 mg subcutaneous every 3 months-start in April 2018  Radiation therapy for endometrial recurrence - completed 04/20/2015  Femara 2.5mg  po q day     Interim History:  Ms.  Mamone is back for followup. I think that is becoming apparent that her myeloma is becoming resigned to the Velcade/Pomalidomide combination. We did do a bone marrow lapsing on her. This was done on July 19. The pathology report ([id number]) showed 8% plasma cells. In going back through her records, the last bone marrow test that she had done at [facility] showed 2% plasma cells.  Her light chains have been going up gradually. In June, her light chains were 5.1 mg/dL. In July, they were 7 mg/dL. Today, the light chains were 9 mg/dL.   She is doing well. She feels well. She's had no specific complaints.  There's been no bony pain. She's had no cough per she's had no shortness of breath. She's had no nausea or vomiting.  There's been no bleeding.  She's had no issues with respect to the recurrent endometrial cancer.  As always, she and her husband have been going down to [place]. Their parents live there. The parents are quite elderly but still independent.   Overall, I see that her performance status is ECOG 1.  .  Medications: Allergies:  Allergies  Allergen Reactions  . Codeine Nausea Only    Past Medical History, Surgical history, Social history, and Family History were reviewed and updated.  Review of Systems: As above  Physical Exam:  vitals were not taken for this visit.  Well-developed and well-nourished [name]. Head  and neck exam shows no ocular or oral lesion. There are no palpable cervical or supraclavicular lymph nodes. Lungs are clear. Cardiac exam regular rate and rhythm with no murmurs rubs or bruits. Abdomen is soft. She has good bowel sounds. There is no fluid wave. There is no palpable liver or spleen tip. Extremities shows no clubbing, cyanosis or edema. Neurological exam shows no focal neurological deficits. Skin exam no rashes, ecchymosis or petechia.  Lab Results  Component Value Date   WBC 4.5 08/30/2016   HGB 10.7 (L) 08/30/2016   HCT 32.4 (L) 08/30/2016   MCV 82 08/30/2016   PLT 241 08/30/2016     Chemistry      Component Value Date/Time   NA 136 08/16/2016 0939   NA 140 06/21/2016 0918   K 3.4 08/16/2016 0939   K 4.3 06/21/2016 0918   CL 105 08/16/2016 0939   CO2 27 08/16/2016 0939   CO2 20 (L) 06/21/2016 0918   BUN 18 08/16/2016 0939   BUN 15.8 06/21/2016 0918   CREATININE 1.2 08/16/2016 0939   CREATININE 0.8 06/21/2016 0918      Component Value Date/Time   CALCIUM 9.5 08/16/2016 0939   CALCIUM 9.4 06/21/2016 0918   ALKPHOS 43 08/16/2016 0939   ALKPHOS 66 06/21/2016 0918   AST 20 08/16/2016 0939   AST 17 06/21/2016 0918   ALT 15 08/16/2016 0939   ALT 37 06/21/2016 0918   BILITOT 0.60 08/16/2016 0939   BILITOT 0.32 06/21/2016 0918         Impression  and Plan: Ms. Megill is 66 year old female. She has recurrent lambda light chain myeloma. She also had recurrent endometrial cancer. The recurrence was localized. She is being followed by OB/GYN and radiation oncology for this.   At this point, I think we're going to have to make a change in her treatment. I would favor Kyprolis. She will need to have a Port-A-Cath no matter what we try.  I have a call into Dr. Alvie Heidelberg, who is her oncologist at [facility]. My options I think are either Kyprolis or Darzalex.  I will keep her on the Pomalidomide. I think Kyprolis with Pomalidomide would be a good option.   The most  recent randomized trial showed that weekly Kyprolis is as effective as twice-weekly dosing.   Once I hear back from Dr. Alvie Heidelberg, then we can start making some changes with Ms. Grams's protocol.   We will go ahead and give her Velcade today.   I spent about 35 minutes with she and her husband.   I would like to see her back in another 3 weeks.    [name], MD 8/1/20181:47 PM

## 2016-08-30 NOTE — Patient Instructions (Signed)

## 2016-08-31 ENCOUNTER — Ambulatory Visit: Payer: BC Managed Care – PPO

## 2016-08-31 ENCOUNTER — Other Ambulatory Visit: Payer: Self-pay | Admitting: *Deleted

## 2016-08-31 LAB — IGG, IGA, IGM
IgA, Qn, Serum: 165 mg/dL (ref 87–352)
IgG, Qn, Serum: 1204 mg/dL (ref 700–1600)
IgM, Qn, Serum: 22 mg/dL — ABNORMAL LOW (ref 26–217)

## 2016-08-31 LAB — KAPPA/LAMBDA LIGHT CHAINS
Ig Kappa Free Light Chain: 21.8 mg/L — ABNORMAL HIGH (ref 3.3–19.4)
Ig Lambda Free Light Chain: 88.9 mg/L — ABNORMAL HIGH (ref 5.7–26.3)
Kappa/Lambda FluidC Ratio: 0.25 — ABNORMAL LOW (ref 0.26–1.65)

## 2016-08-31 MED ORDER — POMALIDOMIDE 1 MG PO CAPS: 1.0000 mg | ORAL_CAPSULE | Freq: Every day | ORAL | 2 refills | Status: DC

## 2016-09-01 ENCOUNTER — Encounter (HOSPITAL_COMMUNITY): Payer: Self-pay

## 2016-09-01 LAB — PROTEIN ELECTROPHORESIS, SERUM, WITH REFLEX
A/G Ratio: 1.4 (ref 0.7–1.7)
Albumin: 4.1 g/dL (ref 2.9–4.4)
Alpha 1: 0.2 g/dL (ref 0.0–0.4)
Alpha 2: 0.7 g/dL (ref 0.4–1.0)
Beta: 1.2 g/dL (ref 0.7–1.3)
Gamma Globulin: 1 g/dL (ref 0.4–1.8)
Globulin, Total: 3 g/dL (ref 2.2–3.9)
Total Protein: 7.1 g/dL (ref 6.0–8.5)

## 2016-09-01 NOTE — Addendum Note (Signed)
Addended by: Burney Gauze R on: 09/01/2016 05:09 PM   Modules accepted: Orders

## 2016-09-04 ENCOUNTER — Encounter: Payer: Self-pay | Admitting: Hematology & Oncology

## 2016-09-04 LAB — TISSUE HYBRIDIZATION (BONE MARROW)-NCBH

## 2016-09-04 LAB — CHROMOSOME ANALYSIS, BONE MARROW

## 2016-09-07 ENCOUNTER — Other Ambulatory Visit: Payer: Self-pay | Admitting: Radiology

## 2016-09-08 ENCOUNTER — Ambulatory Visit (HOSPITAL_COMMUNITY)
Admission: RE | Admit: 2016-09-08 | Discharge: 2016-09-08 | Disposition: A | Discharge status: Home / Self Care | Payer: BC Managed Care – PPO | Source: Ambulatory Visit | Attending: Hematology & Oncology | Admitting: Hematology & Oncology

## 2016-09-08 ENCOUNTER — Other Ambulatory Visit: Payer: Self-pay | Admitting: Hematology & Oncology

## 2016-09-08 ENCOUNTER — Encounter (HOSPITAL_COMMUNITY): Payer: Self-pay

## 2016-09-08 DIAGNOSIS — I10 Essential (primary) hypertension: Secondary | ICD-10-CM | POA: Insufficient documentation

## 2016-09-08 DIAGNOSIS — Z8542 Personal history of malignant neoplasm of other parts of uterus: Secondary | ICD-10-CM | POA: Diagnosis not present

## 2016-09-08 DIAGNOSIS — Z9221 Personal history of antineoplastic chemotherapy: Secondary | ICD-10-CM | POA: Insufficient documentation

## 2016-09-08 DIAGNOSIS — Z7982 Long term (current) use of aspirin: Secondary | ICD-10-CM | POA: Diagnosis not present

## 2016-09-08 DIAGNOSIS — Z79899 Other long term (current) drug therapy: Secondary | ICD-10-CM | POA: Insufficient documentation

## 2016-09-08 DIAGNOSIS — C9000 Multiple myeloma not having achieved remission: Secondary | ICD-10-CM | POA: Diagnosis present

## 2016-09-08 DIAGNOSIS — Z923 Personal history of irradiation: Secondary | ICD-10-CM | POA: Diagnosis not present

## 2016-09-08 DIAGNOSIS — C9001 Multiple myeloma in remission: Secondary | ICD-10-CM

## 2016-09-08 DIAGNOSIS — Z9484 Stem cells transplant status: Secondary | ICD-10-CM | POA: Diagnosis not present

## 2016-09-08 DIAGNOSIS — Z90710 Acquired absence of both cervix and uterus: Secondary | ICD-10-CM | POA: Diagnosis not present

## 2016-09-08 DIAGNOSIS — M810 Age-related osteoporosis without current pathological fracture: Secondary | ICD-10-CM | POA: Insufficient documentation

## 2016-09-08 HISTORY — PX: IR FLUORO GUIDE PORT INSERTION RIGHT: IMG5741

## 2016-09-08 HISTORY — PX: IR US GUIDE VASC ACCESS RIGHT: IMG2390

## 2016-09-08 LAB — PROTIME-INR
INR: 0.92
Prothrombin Time: 12.3 seconds (ref 11.4–15.2)

## 2016-09-08 LAB — CBC
HCT: 33.1 % — ABNORMAL LOW (ref 36.0–46.0)
Hemoglobin: 11 g/dL — ABNORMAL LOW (ref 12.0–15.0)
MCH: 26.6 pg (ref 26.0–34.0)
MCHC: 33.2 g/dL (ref 30.0–36.0)
MCV: 80 fL (ref 78.0–100.0)
Platelets: 202 10*3/uL (ref 150–400)
RBC: 4.14 MIL/uL (ref 3.87–5.11)
RDW: 17.9 % — ABNORMAL HIGH (ref 11.5–15.5)
WBC: 4.3 10*3/uL (ref 4.0–10.5)

## 2016-09-08 LAB — APTT: aPTT: 24 seconds (ref 24–36)

## 2016-09-08 MED ORDER — CEFAZOLIN SODIUM-DEXTROSE 2-4 GM/100ML-% IV SOLN
2.0000 g | Freq: Once | INTRAVENOUS | Status: AC
  Administered 2016-09-08: 2 g via INTRAVENOUS

## 2016-09-08 MED ORDER — HEPARIN SOD (PORK) LOCK FLUSH 100 UNIT/ML IV SOLN
INTRAVENOUS | Status: AC
  Filled 2016-09-08: qty 5

## 2016-09-08 MED ORDER — LIDOCAINE-EPINEPHRINE (PF) 2 %-1:200000 IJ SOLN
INTRAMUSCULAR | Status: AC
  Filled 2016-09-08: qty 20

## 2016-09-08 MED ORDER — MIDAZOLAM HCL 2 MG/2ML IJ SOLN
INTRAMUSCULAR | Status: AC | PRN
  Administered 2016-09-08 (×2): 1 mg via INTRAVENOUS

## 2016-09-08 MED ORDER — CEFAZOLIN SODIUM-DEXTROSE 2-4 GM/100ML-% IV SOLN
INTRAVENOUS | Status: AC
  Filled 2016-09-08: qty 100

## 2016-09-08 MED ORDER — SODIUM CHLORIDE 0.9 % IV SOLN
Freq: Once | INTRAVENOUS | Status: AC
  Administered 2016-09-08: 13:00:00 via INTRAVENOUS

## 2016-09-08 MED ORDER — LIDOCAINE-EPINEPHRINE (PF) 2 %-1:200000 IJ SOLN
INTRAMUSCULAR | Status: AC | PRN
  Administered 2016-09-08: 10 mL

## 2016-09-08 MED ORDER — MIDAZOLAM HCL 2 MG/2ML IJ SOLN
INTRAMUSCULAR | Status: AC
  Filled 2016-09-08: qty 4

## 2016-09-08 MED ORDER — FENTANYL CITRATE (PF) 100 MCG/2ML IJ SOLN
INTRAMUSCULAR | Status: AC
  Filled 2016-09-08: qty 4

## 2016-09-08 MED ORDER — FENTANYL CITRATE (PF) 100 MCG/2ML IJ SOLN
INTRAMUSCULAR | Status: AC | PRN
  Administered 2016-09-08 (×2): 25 ug via INTRAVENOUS
  Administered 2016-09-08: 50 ug via INTRAVENOUS

## 2016-09-08 NOTE — Procedures (Signed)
Pre Procedure Dx: Multiple Myeloma Post Procedural Dx: Same  Successful placement of right IJ approach port-a-cath with tip at the superior caval atrial junction. The catheter is ready for immediate use.  Estimated Blood Loss: Minimal  Complications: None immediate.  Jay Melany Wiesman, MD Pager #: [phone]   

## 2016-09-08 NOTE — Sedation Documentation (Signed)
Patient is resting comfortably. 

## 2016-09-08 NOTE — Sedation Documentation (Signed)
Pt alert but states she is comfortable. Will continue to monitor.

## 2016-09-08 NOTE — H&P (Signed)
  Chief Complaint: multiple myeloma  Referring Physician:Dr. Peter Ennever  Supervising Physician: Watts, John  Patient Status: [facility] - Out-pt  HPI: Emily Greer is a 66 y.o. female with a history of multiple myeloma since 1999/2000.  She is followed by Dr. Ennever as well as a physician at [facility] in the past.  She has received multiple treatments for this in the past.  She has recently been found to have a recurrence and had a BMBX to confirm this done several weeks ago.  She presents today for placement of a PAC so she can start a new therapy.  She has no new medical problems or complaints since her last visit.  Past Medical History:  Past Medical History:  Diagnosis Date  . Avascular necrosis of femoral head ([facility])    bilateral per CT 07-26-2015  . Endometrial carcinoma ([facility]) gyn oncologist-  dr brewster ([facility])/  radiation oncologist-- dr kinard   2013 dx  FIGO Stage 1A, Grade 2 endometrioid endometrial cancer s/p TAH w/ BSO and bilateral pelvic node dissection 10-31-2011 ;  recurrence at distal vagina 04/ 2014 s/p  brachytherapy (ended 07-29-2012);  2nd recurrence 12/ 2016  vaginal apex s/p  conformational radiotherapy 03-10-2015 to 04-20-2015  . GERD (gastroesophageal reflux disease)   . H/O stem cell transplant ([facility])    02/ 2000 and second one 06/ 2016  . 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 History of radiation therapy 6/4, 6/11, 6/19, 6/25, 07/29/2012   vagina 30.5 gray in 5 fx, HDR brachytherapy:   last radiation to vagina 03-10-2015 to 04-20-2015  50.4gray  . History of radiation therapy 03/10/15-04/20/15   vagina 50.4 in 28 fractions  . Hypertension   . Lambda light chain myeloma ([facility]) oncologist-  dr ennever ([facility])  and  [facility] -- dr cristina gasparetto   dx 07/ 1999 s/p  VAD chemotherapy 11/ 1999,  purged autotransplant 03-21-1998 followed by auto stem cell transplant 03-29-1998;  recurrance w/ second  autologous stem cell transplant 07-24-2014;  in Re-mission currently , chemo maintenance therapy  . Osteoporosis 12/18/05   Increased  risk   . PONV (postoperative nausea and vomiting)   . Wears glasses     Past Surgical History:  Past Surgical History:  Procedure Laterality Date  . ABDOMINAL HYSTERECTOMY  10/31/2011   Procedure: HYSTERECTOMY ABDOMINAL;  Surgeon: Wendy Brewster, MD PHD;  Location: [facility];  Service: Gynecology;  Laterality: N/A;  . CO2 LASER APPLICATION N/A 02/22/2016   Procedure: CO2 LASER APPLICATION;  Surgeon: Wendy Brewster, MD;  Location: [facility];  Service: Gynecology;  Laterality: N/A;  . ECTOPIC PREGNANCY SURGERY  1992  . HYSTEROSCOPY W/D&C  09/27/2011   Procedure: DILATATION AND CURETTAGE /HYSTEROSCOPY;  Surgeon: Vanessa P Haygood, MD;  Location: [facility];  Service: Gynecology;;  . LAPAROTOMY  10/31/2011   Procedure: EXPLORATORY LAPAROTOMY;  Surgeon: Wendy Brewster, MD PHD;  Location: [facility];  Service: Gynecology;  Laterality: N/A;  . SALPINGOOPHORECTOMY  10/31/2011   Procedure: SALPINGO OOPHERECTOMY;  Surgeon: Wendy Brewster, MD PHD;  Location: [facility];  Service: Gynecology;  Laterality: Bilateral;   Lymph Nodes sampling  . TUBAL LIGATION  1986    Family History:  Family History  Problem Relation Age of Onset  . Colon cancer Mother     Social History:  reports that she has never smoked. She has never used smokeless tobacco. She reports that she drinks alcohol. She   reports that she does not use drugs.  Allergies:  Allergies  Allergen Reactions  . Codeine Nausea Only    Medications: Medications reviewed in epic  Please HPI for pertinent positives, otherwise complete 10 system ROS negative.  Mallampati Score: MD Evaluation Airway: WNL Heart: WNL Abdomen: WNL Chest/ Lungs: WNL ASA  Classification: 2 Mallampati/Airway Score: Two  Physical Exam: BP (!) 142/87   Pulse 73   Temp 98 F (36.7 C) (Oral)   Resp 16   Ht 5' 4" (1.626 m)    Wt 198 lb (89.8 kg)   SpO2 99%   BMI 33.99 kg/m  Body mass index is 33.99 kg/m. General: pleasant, WD, WN black female who is laying in bed in NAD HEENT: head is normocephalic, atraumatic.  Sclera are noninjected.  PERRL.  Ears and nose without any masses or lesions.  Mouth is pink and moist Heart: regular, rate, and rhythm.  Normal s1,s2. No obvious murmurs, gallops, or rubs noted.  Palpable radial pulses bilaterally Lungs: CTAB, no wheezes, rhonchi, or rales noted.  Respiratory effort nonlabored.  Chest reveals scars from possible prior PAC (she can't remember) Abd: soft, NT, ND, +BS, no masses, hernias, or organomegaly Psych: A&Ox3 with an appropriate affect.   Labs: Results for orders placed or performed during the hospital encounter of 09/08/16 (from the past 48 hour(s))  CBC     Status: Abnormal   Collection Time: 09/08/16  1:27 PM  Result Value Ref Range   WBC 4.3 4.0 - 10.5 K/uL   RBC 4.14 3.87 - 5.11 MIL/uL   Hemoglobin 11.0 (L) 12.0 - 15.0 g/dL   HCT 33.1 (L) 36.0 - 46.0 %   MCV 80.0 78.0 - 100.0 fL   MCH 26.6 26.0 - 34.0 pg   MCHC 33.2 30.0 - 36.0 g/dL   RDW 17.9 (H) 11.5 - 15.5 %   Platelets 202 150 - 400 K/uL    Imaging: No results found.  Assessment/Plan 1. Recurrent multiple myeloma  We will plan to proceed today with placement of a PAC so the patient can initiate new treatment.  She has no new complaints or changes to her medical history since we just saw her several weeks ago.  Labs and vitals reviewed. Risks and benefits discussed with the patient including, but not limited to bleeding, infection, pneumothorax, or fibrin sheath development and need for additional procedures. All of the patient's questions were answered, patient is agreeable to proceed. Consent signed and in chart.  Thank you for this interesting consult.  I greatly enjoyed meeting Emily Greer and look forward to participating in their care.  A copy of this report was sent to the  requesting provider on this date.  Electronically Signed: Riyanna Crutchley E 09/08/2016, 1:44 PM   I spent a total of    15 Minutes in face to face in clinical consultation, greater than 50% of which was counseling/coordinating care for multiple myeloma   

## 2016-09-13 ENCOUNTER — Ambulatory Visit (HOSPITAL_BASED_OUTPATIENT_CLINIC_OR_DEPARTMENT_OTHER): Payer: BC Managed Care – PPO | Admitting: Hematology & Oncology

## 2016-09-13 ENCOUNTER — Ambulatory Visit (HOSPITAL_BASED_OUTPATIENT_CLINIC_OR_DEPARTMENT_OTHER): Payer: BC Managed Care – PPO

## 2016-09-13 ENCOUNTER — Other Ambulatory Visit (HOSPITAL_BASED_OUTPATIENT_CLINIC_OR_DEPARTMENT_OTHER): Payer: BC Managed Care – PPO

## 2016-09-13 VITALS — BP 129/73 | HR 73 | Temp 98.2°F | Resp 18 | Wt 198.0 lb

## 2016-09-13 DIAGNOSIS — C541 Malignant neoplasm of endometrium: Secondary | ICD-10-CM

## 2016-09-13 DIAGNOSIS — Z79811 Long term (current) use of aromatase inhibitors: Secondary | ICD-10-CM

## 2016-09-13 DIAGNOSIS — C9000 Multiple myeloma not having achieved remission: Secondary | ICD-10-CM

## 2016-09-13 DIAGNOSIS — Z5112 Encounter for antineoplastic immunotherapy: Secondary | ICD-10-CM | POA: Diagnosis not present

## 2016-09-13 DIAGNOSIS — C9001 Multiple myeloma in remission: Secondary | ICD-10-CM

## 2016-09-13 LAB — CMP (CANCER CENTER ONLY)
ALT(SGPT): 14 U/L (ref 10–47)
AST: 19 U/L (ref 11–38)
Albumin: 3.7 g/dL (ref 3.3–5.5)
Alkaline Phosphatase: 48 U/L (ref 26–84)
BUN, Bld: 15 mg/dL (ref 7–22)
CO2: 29 mEq/L (ref 18–33)
Calcium: 9.5 mg/dL (ref 8.0–10.3)
Chloride: 104 mEq/L (ref 98–108)
Creat: 1 mg/dl (ref 0.6–1.2)
Glucose, Bld: 97 mg/dL (ref 73–118)
Potassium: 4 mEq/L (ref 3.3–4.7)
Sodium: 140 mEq/L (ref 128–145)
Total Bilirubin: 0.7 mg/dl (ref 0.20–1.60)
Total Protein: 7.8 g/dL (ref 6.4–8.1)

## 2016-09-13 LAB — CBC WITH DIFFERENTIAL (CANCER CENTER ONLY)
BASO#: 0.1 10*3/uL (ref 0.0–0.2)
BASO%: 2.5 % — ABNORMAL HIGH (ref 0.0–2.0)
EOS%: 3.2 % (ref 0.0–7.0)
Eosinophils Absolute: 0.1 10*3/uL (ref 0.0–0.5)
HCT: 31.8 % — ABNORMAL LOW (ref 34.8–46.6)
HGB: 10.3 g/dL — ABNORMAL LOW (ref 11.6–15.9)
LYMPH#: 1.1 10*3/uL (ref 0.9–3.3)
LYMPH%: 39.1 % (ref 14.0–48.0)
MCH: 26.6 pg (ref 26.0–34.0)
MCHC: 32.4 g/dL (ref 32.0–36.0)
MCV: 82 fL (ref 81–101)
MONO#: 0.4 10*3/uL (ref 0.1–0.9)
MONO%: 14.7 % — ABNORMAL HIGH (ref 0.0–13.0)
NEUT#: 1.1 10*3/uL — ABNORMAL LOW (ref 1.5–6.5)
NEUT%: 40.5 % (ref 39.6–80.0)
Platelets: 244 10*3/uL (ref 145–400)
RBC: 3.87 10*6/uL (ref 3.70–5.32)
RDW: 17.7 % — ABNORMAL HIGH (ref 11.1–15.7)
WBC: 2.8 10*3/uL — ABNORMAL LOW (ref 3.9–10.0)

## 2016-09-13 MED ORDER — SODIUM CHLORIDE 0.9% FLUSH
10.0000 mL | INTRAVENOUS | Status: DC | PRN
  Administered 2016-09-13: 10 mL
  Filled 2016-09-13: qty 10

## 2016-09-13 MED ORDER — SODIUM CHLORIDE 0.9 % IV SOLN
Freq: Once | INTRAVENOUS | Status: AC
  Administered 2016-09-13: 11:00:00 via INTRAVENOUS

## 2016-09-13 MED ORDER — ACYCLOVIR 400 MG PO TABS: 400.0000 mg | ORAL_TABLET | Freq: Every day | ORAL | 3 refills | Status: DC

## 2016-09-13 MED ORDER — LIDOCAINE-PRILOCAINE 2.5-2.5 % EX CREA: TOPICAL_CREAM | CUTANEOUS | 3 refills | Status: DC

## 2016-09-13 MED ORDER — PROCHLORPERAZINE MALEATE 10 MG PO TABS
10.0000 mg | ORAL_TABLET | Freq: Once | ORAL | Status: AC
  Administered 2016-09-13: 10 mg via ORAL

## 2016-09-13 MED ORDER — PROCHLORPERAZINE MALEATE 10 MG PO TABS: 10.0000 mg | ORAL_TABLET | Freq: Four times a day (QID) | ORAL | 1 refills | Status: DC | PRN

## 2016-09-13 MED ORDER — HEPARIN SOD (PORK) LOCK FLUSH 100 UNIT/ML IV SOLN
500.0000 [IU] | Freq: Once | INTRAVENOUS | Status: AC | PRN
  Administered 2016-09-13: 500 [IU]
  Filled 2016-09-13: qty 5

## 2016-09-13 MED ORDER — PROCHLORPERAZINE MALEATE 10 MG PO TABS
ORAL_TABLET | ORAL | Status: AC
  Filled 2016-09-13: qty 1

## 2016-09-13 MED ORDER — CARFILZOMIB CHEMO INJECTION 60 MG
20.0000 mg/m2 | Freq: Once | INTRAVENOUS | Status: AC
  Administered 2016-09-13: 40 mg via INTRAVENOUS
  Filled 2016-09-13: qty 20

## 2016-09-13 MED ORDER — ONDANSETRON HCL 8 MG PO TABS: 8.0000 mg | ORAL_TABLET | Freq: Two times a day (BID) | ORAL | 1 refills | Status: DC | PRN

## 2016-09-13 NOTE — Progress Notes (Signed)
Hematology and Oncology Follow Up Visit  Emily Greer [id number] 1950-03-01 66 y.o. 09/13/2016   Principle Diagnosis:   Recurrent lambda light chain myeloma  History of recurrent endometrial carcinoma  Current Therapy:    Status post second autologous stem cell transplant on 07/24/2014  Maintenance therapy with Pomalidomide/every 2 week Velcade  Xgeva 120 mg subcutaneous every 3 months-start in April 2018  Radiation therapy for endometrial recurrence - completed 04/20/2015  Femara 2.5mg  po q day       Kyprolis 70mg /m2 IV q week (3/1) - start on 09/13/2016      Interim History:  Emily Greer is back for followup. She is doing well. She had her Port-A-Cath placed last week. Everything went fine.  She now is ready for her Kyprolis. We had to make a change in therapy as her lambda light chain has been increasing.  I did speak with Dr. Barbaraann Boys at [facility]. She agrees with the change.  She is doing well overall. She's having no problems with the Femara for the year recurrent uterine cancer.  She and her husband will be going down to [place] this weekend. Both other parents live there and are quite elderly.  She's having no problems with bowels or bladder. She is having a positive leg swelling. She's having no rashes.    Overall,  her performance status is ECOG 1.  .  Medications: Allergies:  Allergies  Allergen Reactions  . Codeine Nausea Only    Past Medical History, Surgical history, Social history, and Family History were reviewed and updated.  Review of Systems: As above  Physical Exam:  weight is 198 lb (89.8 kg). Her oral temperature is 98.2 F (36.8 C). Her blood pressure is 129/73 and her pulse is 73. Her respiration is 18 and oxygen saturation is 100%.   Well-developed and well-nourished [name]. Head and neck exam shows no ocular or oral lesion. There are no palpable cervical or supraclavicular lymph nodes. Lungs are clear.  Cardiac exam regular rate and rhythm with no murmurs rubs or bruits. Abdomen is soft. She has good bowel sounds. There is no fluid wave. There is no palpable liver or spleen tip. Extremities shows no clubbing, cyanosis or edema. Neurological exam shows no focal neurological deficits. Skin exam no rashes, ecchymosis or petechia.  Lab Results  Component Value Date   WBC 2.8 (L) 09/13/2016   HGB 10.3 (L) 09/13/2016   HCT 31.8 (L) 09/13/2016   MCV 82 09/13/2016   PLT 244 09/13/2016     Chemistry      Component Value Date/Time   NA 140 09/13/2016 0911   NA 140 06/21/2016 0918   K 4.0 09/13/2016 0911   K 4.3 06/21/2016 0918   CL 104 09/13/2016 0911   CO2 29 09/13/2016 0911   CO2 20 (L) 06/21/2016 0918   BUN 15 09/13/2016 0911   BUN 15.8 06/21/2016 0918   CREATININE 1.0 09/13/2016 0911   CREATININE 0.8 06/21/2016 0918      Component Value Date/Time   CALCIUM 9.5 09/13/2016 0911   CALCIUM 9.4 06/21/2016 0918   ALKPHOS 48 09/13/2016 0911   ALKPHOS 66 06/21/2016 0918   AST 19 09/13/2016 0911   AST 17 06/21/2016 0918   ALT 14 09/13/2016 0911   ALT 37 06/21/2016 0918   BILITOT 0.70 09/13/2016 0911   BILITOT 0.32 06/21/2016 0918         Impression and Plan: Emily Greer is 66 year old female. She has recurrent lambda light  chain myeloma. She also had recurrent endometrial cancer. The recurrence was localized. She is being followed by OB/GYN and radiation oncology for this.   She will start her Kyprolis today. I told her to not take her Pomalidomide today or tomorrow. She will then be off for 1 week.  I think that she will respond nicely to the Kyprolis/Pomalidomide combination.  We will plan to see her back the start of her second cycle of treatment.     Josph Macho, MD 8/15/201810:20 AM

## 2016-09-13 NOTE — Patient Instructions (Addendum)
Emily Greer injection What is this medicine? Emily Greer (kar FILZ oh mib) targets a specific protein within cancer cells and stops the cancer cells from growing. It is used to treat multiple myeloma. This medicine may be used for other purposes; ask your health care provider or pharmacist if you have questions. COMMON BRAND NAME(S): KYPROLIS What should I tell my health care provider before I take this medicine? They need to know if you have any of these conditions: -heart disease -history of blood clots -irregular heartbeat -kidney disease -liver disease -lung or breathing disease -an unusual or allergic reaction to Emily Greer, or other medicines, foods, dyes, or preservatives -pregnant or trying to get pregnant -breast-feeding How should I use this medicine? This medicine is for injection or infusion into a vein. It is given by a health care professional in a hospital or clinic setting. Talk to your pediatrician regarding the use of this medicine in children. Special care may be needed. Overdosage: If you think you have taken too much of this medicine contact a poison control center or emergency room at once. NOTE: This medicine is only for you. Do not share this medicine with others. What if I miss a dose? It is important not to miss your dose. Call your doctor or health care professional if you are unable to keep an appointment. What may interact with this medicine? Interactions are not expected. Give your health care provider a list of all the medicines, herbs, non-prescription drugs, or dietary supplements you use. Also tell them if you smoke, drink alcohol, or use illegal drugs. Some items may interact with your medicine. This list may not describe all possible interactions. Give your health care provider a list of all the medicines, herbs, non-prescription drugs, or dietary supplements you use. Also tell them if you smoke, drink alcohol, or use illegal drugs. Some items may  interact with your medicine. What should I watch for while using this medicine? Your condition will be monitored carefully while you are receiving this medicine. Report any side effects. Continue your course of treatment even though you feel ill unless your doctor tells you to stop. You may need blood work done while you are taking this medicine. Do not become pregnant while taking this medicine or for at least 30 days after stopping it. Women should inform their doctor if they wish to become pregnant or think they might be pregnant. There is a potential for serious side effects to an unborn child. Men should not father a child while taking this medicine and for 90 days after stopping it. Talk to your health care professional or pharmacist for more information. Do not breast-feed an infant while taking this medicine. Check with your doctor or health care professional if you get an attack of severe diarrhea, nausea and vomiting, or if you sweat a lot. The loss of too much body fluid can make it dangerous for you to take this medicine. You may get dizzy. Do not drive, use machinery, or do anything that needs mental alertness until you know how this medicine affects you. Do not stand or sit up quickly, especially if you are an older patient. This reduces the risk of dizzy or fainting spells. What side effects may I notice from receiving this medicine? Side effects that you should report to your doctor or health care professional as soon as possible: -allergic reactions like skin rash, itching or hives, swelling of the face, lips, or tongue -confusion -dizziness -feeling faint or lightheaded -fever or chills -  palpitations -seizures -signs and symptoms of bleeding such as bloody or black, tarry stools; red or dark-brown urine; spitting up blood or brown material that looks like coffee grounds; red spots on the skin; unusual bruising or bleeding including from the eye, gums, or nose -signs and symptoms of  a blood clot such as breathing problems; changes in vision; chest pain; severe, sudden headache; pain, swelling, warmth in the leg; trouble speaking; sudden numbness or weakness of the face, arm or leg -signs and symptoms of kidney injury like trouble passing urine or change in the amount of urine -signs and symptoms of liver injury like dark yellow or brown urine; general ill feeling or flu-like symptoms; light-colored stools; loss of appetite; nausea; right upper belly pain; unusually weak or tired; yellowing of the eyes or skin Side effects that usually do not require medical attention (report to your doctor or health care professional if they continue or are bothersome): -back pain -cough -diarrhea -headache -muscle cramps -vomiting This list may not describe all possible side effects. Call your doctor for medical advice about side effects. You may report side effects to FDA at 1-800-FDA-1088. Where should I keep my medicine? This drug is given in a hospital or clinic and will not be stored at home. NOTE: This sheet is a summary. It may not cover all possible information. If you have questions about this medicine, talk to your doctor, pharmacist, or health care provider.  2018 Elsevier/Gold Standard (2015-02-18 13:39:23) [facility] Discharge Instructions for Patients Receiving Chemotherapy  Today you received the following chemotherapy agents Kyprolis  To help prevent nausea and vomiting after your treatment, we encourage you to take your nausea medication Compazine & Zofran, along with Acyclovir for shingles prevention.   If you develop nausea and vomiting that is not controlled by your nausea medication, call the clinic.   BELOW ARE SYMPTOMS THAT SHOULD BE REPORTED IMMEDIATELY:  *FEVER GREATER THAN 100.5 F  *CHILLS WITH OR WITHOUT FEVER  NAUSEA AND VOMITING THAT IS NOT CONTROLLED WITH YOUR NAUSEA MEDICATION  *UNUSUAL SHORTNESS OF BREATH  *UNUSUAL BRUISING OR  BLEEDING  TENDERNESS IN MOUTH AND THROAT WITH OR WITHOUT PRESENCE OF ULCERS  *URINARY PROBLEMS  *BOWEL PROBLEMS  UNUSUAL RASH Items with * indicate a potential emergency and should be followed up as soon as possible.  Feel free to call the clinic you have any questions or concerns. The clinic phone number is [phone].  Please show the [facility] at check-in to the Emergency Department and triage nurse.

## 2016-09-14 ENCOUNTER — Telehealth: Payer: Self-pay

## 2016-09-14 ENCOUNTER — Encounter: Payer: Self-pay | Admitting: Hematology & Oncology

## 2016-09-14 LAB — KAPPA/LAMBDA LIGHT CHAINS
Ig Kappa Free Light Chain: 19.2 mg/L (ref 3.3–19.4)
Ig Lambda Free Light Chain: 96.9 mg/L — ABNORMAL HIGH (ref 5.7–26.3)
Kappa/Lambda FluidC Ratio: 0.2 — ABNORMAL LOW (ref 0.26–1.65)

## 2016-09-14 NOTE — Telephone Encounter (Signed)
Spoke with pt via phone for 1st time Kyprolis follow up. Pt denies side effects at this time.   Per [facility] pharmacist and Dr Marin Olp, pt to take Acyclovir daily (instead of previously prescribed bid). Pt verbalizes understanding. dph

## 2016-09-19 ENCOUNTER — Other Ambulatory Visit: Payer: Self-pay | Admitting: *Deleted

## 2016-09-19 DIAGNOSIS — C9001 Multiple myeloma in remission: Secondary | ICD-10-CM

## 2016-09-20 ENCOUNTER — Ambulatory Visit (HOSPITAL_BASED_OUTPATIENT_CLINIC_OR_DEPARTMENT_OTHER): Payer: BC Managed Care – PPO

## 2016-09-20 ENCOUNTER — Other Ambulatory Visit (HOSPITAL_BASED_OUTPATIENT_CLINIC_OR_DEPARTMENT_OTHER): Payer: BC Managed Care – PPO

## 2016-09-20 VITALS — BP 125/66 | HR 67 | Temp 98.8°F | Resp 20

## 2016-09-20 DIAGNOSIS — Z5112 Encounter for antineoplastic immunotherapy: Secondary | ICD-10-CM | POA: Diagnosis not present

## 2016-09-20 DIAGNOSIS — C9000 Multiple myeloma not having achieved remission: Secondary | ICD-10-CM | POA: Diagnosis not present

## 2016-09-20 DIAGNOSIS — C9001 Multiple myeloma in remission: Secondary | ICD-10-CM

## 2016-09-20 LAB — CBC WITH DIFFERENTIAL (CANCER CENTER ONLY)
BASO#: 0.1 10*3/uL (ref 0.0–0.2)
BASO%: 1.4 % (ref 0.0–2.0)
EOS%: 0.8 % (ref 0.0–7.0)
Eosinophils Absolute: 0 10*3/uL (ref 0.0–0.5)
HCT: 32.5 % — ABNORMAL LOW (ref 34.8–46.6)
HGB: 10.7 g/dL — ABNORMAL LOW (ref 11.6–15.9)
LYMPH#: 1.5 10*3/uL (ref 0.9–3.3)
LYMPH%: 41.4 % (ref 14.0–48.0)
MCH: 26.6 pg (ref 26.0–34.0)
MCHC: 32.9 g/dL (ref 32.0–36.0)
MCV: 81 fL (ref 81–101)
MONO#: 0.5 10*3/uL (ref 0.1–0.9)
MONO%: 13 % (ref 0.0–13.0)
NEUT#: 1.6 10*3/uL (ref 1.5–6.5)
NEUT%: 43.4 % (ref 39.6–80.0)
Platelets: 250 10*3/uL (ref 145–400)
RBC: 4.03 10*6/uL (ref 3.70–5.32)
RDW: 17.6 % — ABNORMAL HIGH (ref 11.1–15.7)
WBC: 3.6 10*3/uL — ABNORMAL LOW (ref 3.9–10.0)

## 2016-09-20 LAB — CMP (CANCER CENTER ONLY)
ALT(SGPT): 16 U/L (ref 10–47)
AST: 18 U/L (ref 11–38)
Albumin: 3.9 g/dL (ref 3.3–5.5)
Alkaline Phosphatase: 48 U/L (ref 26–84)
BUN, Bld: 22 mg/dL (ref 7–22)
CO2: 27 mEq/L (ref 18–33)
Calcium: 9.4 mg/dL (ref 8.0–10.3)
Chloride: 106 mEq/L (ref 98–108)
Creat: 1.1 mg/dl (ref 0.6–1.2)
Glucose, Bld: 93 mg/dL (ref 73–118)
Potassium: 4.2 mEq/L (ref 3.3–4.7)
Sodium: 138 mEq/L (ref 128–145)
Total Bilirubin: 0.6 mg/dl (ref 0.20–1.60)
Total Protein: 7.8 g/dL (ref 6.4–8.1)

## 2016-09-20 LAB — LACTATE DEHYDROGENASE: LDH: 170 U/L (ref 125–245)

## 2016-09-20 MED ORDER — HEPARIN SOD (PORK) LOCK FLUSH 100 UNIT/ML IV SOLN
500.0000 [IU] | Freq: Once | INTRAVENOUS | Status: AC | PRN
  Administered 2016-09-20: 500 [IU]
  Filled 2016-09-20: qty 5

## 2016-09-20 MED ORDER — SODIUM CHLORIDE 0.9 % IV SOLN
Freq: Once | INTRAVENOUS | Status: AC
  Administered 2016-09-20: 12:00:00 via INTRAVENOUS

## 2016-09-20 MED ORDER — PROCHLORPERAZINE MALEATE 10 MG PO TABS
ORAL_TABLET | ORAL | Status: AC
  Filled 2016-09-20: qty 1

## 2016-09-20 MED ORDER — DEXTROSE 5 % IV SOLN
70.0000 mg/m2 | Freq: Once | INTRAVENOUS | Status: AC
  Administered 2016-09-20: 140 mg via INTRAVENOUS
  Filled 2016-09-20: qty 70

## 2016-09-20 MED ORDER — SODIUM CHLORIDE 0.9% FLUSH
10.0000 mL | INTRAVENOUS | Status: DC | PRN
  Administered 2016-09-20: 10 mL
  Filled 2016-09-20: qty 10

## 2016-09-20 MED ORDER — PROCHLORPERAZINE MALEATE 10 MG PO TABS
10.0000 mg | ORAL_TABLET | Freq: Once | ORAL | Status: AC
  Administered 2016-09-20: 10 mg via ORAL

## 2016-09-20 NOTE — Patient Instructions (Signed)
Carfilzomib injection What is this medicine? CARFILZOMIB (kar FILZ oh mib) targets a specific protein within cancer cells and stops the cancer cells from growing. It is used to treat multiple myeloma. This medicine may be used for other purposes; ask your health care provider or pharmacist if you have questions. COMMON BRAND NAME(S): KYPROLIS What should I tell my health care provider before I take this medicine? They need to know if you have any of these conditions: -heart disease -history of blood clots -irregular heartbeat -kidney disease -liver disease -lung or breathing disease -an unusual or allergic reaction to carfilzomib, or other medicines, foods, dyes, or preservatives -pregnant or trying to get pregnant -breast-feeding How should I use this medicine? This medicine is for injection or infusion into a vein. It is given by a health care professional in a hospital or clinic setting. Talk to your pediatrician regarding the use of this medicine in children. Special care may be needed. Overdosage: If you think you have taken too much of this medicine contact a poison control center or emergency room at once. NOTE: This medicine is only for you. Do not share this medicine with others. What if I miss a dose? It is important not to miss your dose. Call your doctor or health care professional if you are unable to keep an appointment. What may interact with this medicine? Interactions are not expected. Give your health care provider a list of all the medicines, herbs, non-prescription drugs, or dietary supplements you use. Also tell them if you smoke, drink alcohol, or use illegal drugs. Some items may interact with your medicine. This list may not describe all possible interactions. Give your health care provider a list of all the medicines, herbs, non-prescription drugs, or dietary supplements you use. Also tell them if you smoke, drink alcohol, or use illegal drugs. Some items may  interact with your medicine. What should I watch for while using this medicine? Your condition will be monitored carefully while you are receiving this medicine. Report any side effects. Continue your course of treatment even though you feel ill unless your doctor tells you to stop. You may need blood work done while you are taking this medicine. Do not become pregnant while taking this medicine or for at least 30 days after stopping it. Women should inform their doctor if they wish to become pregnant or think they might be pregnant. There is a potential for serious side effects to an unborn child. Men should not father a child while taking this medicine and for 90 days after stopping it. Talk to your health care professional or pharmacist for more information. Do not breast-feed an infant while taking this medicine. Check with your doctor or health care professional if you get an attack of severe diarrhea, nausea and vomiting, or if you sweat a lot. The loss of too much body fluid can make it dangerous for you to take this medicine. You may get dizzy. Do not drive, use machinery, or do anything that needs mental alertness until you know how this medicine affects you. Do not stand or sit up quickly, especially if you are an older patient. This reduces the risk of dizzy or fainting spells. What side effects may I notice from receiving this medicine? Side effects that you should report to your doctor or health care professional as soon as possible: -allergic reactions like skin rash, itching or hives, swelling of the face, lips, or tongue -confusion -dizziness -feeling faint or lightheaded -fever or chills -  palpitations -seizures -signs and symptoms of bleeding such as bloody or black, tarry stools; red or dark-brown urine; spitting up blood or brown material that looks like coffee grounds; red spots on the skin; unusual bruising or bleeding including from the eye, gums, or nose -signs and symptoms of  a blood clot such as breathing problems; changes in vision; chest pain; severe, sudden headache; pain, swelling, warmth in the leg; trouble speaking; sudden numbness or weakness of the face, arm or leg -signs and symptoms of kidney injury like trouble passing urine or change in the amount of urine -signs and symptoms of liver injury like dark yellow or brown urine; general ill feeling or flu-like symptoms; light-colored stools; loss of appetite; nausea; right upper belly pain; unusually weak or tired; yellowing of the eyes or skin Side effects that usually do not require medical attention (report to your doctor or health care professional if they continue or are bothersome): -back pain -cough -diarrhea -headache -muscle cramps -vomiting This list may not describe all possible side effects. Call your doctor for medical advice about side effects. You may report side effects to FDA at 1-800-FDA-1088. Where should I keep my medicine? This drug is given in a hospital or clinic and will not be stored at home. NOTE: This sheet is a summary. It may not cover all possible information. If you have questions about this medicine, talk to your doctor, pharmacist, or health care provider.  2018 Elsevier/Gold Standard (2015-02-18 13:39:23)

## 2016-09-21 LAB — KAPPA/LAMBDA LIGHT CHAINS
Ig Kappa Free Light Chain: 13.4 mg/L (ref 3.3–19.4)
Ig Lambda Free Light Chain: 91.2 mg/L — ABNORMAL HIGH (ref 5.7–26.3)
Kappa/Lambda FluidC Ratio: 0.15 — ABNORMAL LOW (ref 0.26–1.65)

## 2016-09-21 LAB — IGG, IGA, IGM
IgA, Qn, Serum: 144 mg/dL (ref 87–352)
IgG, Qn, Serum: 1241 mg/dL (ref 700–1600)
IgM, Qn, Serum: 18 mg/dL — ABNORMAL LOW (ref 26–217)

## 2016-09-26 LAB — PROTEIN ELECTROPHORESIS, SERUM, WITH REFLEX
A/G Ratio: 1.3 (ref 0.7–1.7)
Albumin: 4 g/dL (ref 2.9–4.4)
Alpha 1: 0.2 g/dL (ref 0.0–0.4)
Alpha 2: 0.7 g/dL (ref 0.4–1.0)
Beta: 1.2 g/dL (ref 0.7–1.3)
Gamma Globulin: 1 g/dL (ref 0.4–1.8)
Globulin, Total: 3 g/dL (ref 2.2–3.9)
Interpretation(See Below): 0
Total Protein: 7 g/dL (ref 6.0–8.5)

## 2016-09-27 ENCOUNTER — Ambulatory Visit (HOSPITAL_BASED_OUTPATIENT_CLINIC_OR_DEPARTMENT_OTHER): Payer: BC Managed Care – PPO

## 2016-09-27 ENCOUNTER — Other Ambulatory Visit (HOSPITAL_BASED_OUTPATIENT_CLINIC_OR_DEPARTMENT_OTHER): Payer: BC Managed Care – PPO

## 2016-09-27 ENCOUNTER — Other Ambulatory Visit: Payer: Self-pay | Admitting: *Deleted

## 2016-09-27 VITALS — BP 130/78 | HR 73 | Temp 98.4°F | Resp 20

## 2016-09-27 DIAGNOSIS — C9001 Multiple myeloma in remission: Secondary | ICD-10-CM

## 2016-09-27 DIAGNOSIS — Z5112 Encounter for antineoplastic immunotherapy: Secondary | ICD-10-CM

## 2016-09-27 DIAGNOSIS — C9000 Multiple myeloma not having achieved remission: Secondary | ICD-10-CM

## 2016-09-27 LAB — CBC WITH DIFFERENTIAL (CANCER CENTER ONLY)
BASO#: 0 10*3/uL (ref 0.0–0.2)
BASO%: 0.8 % (ref 0.0–2.0)
EOS%: 5.3 % (ref 0.0–7.0)
Eosinophils Absolute: 0.1 10*3/uL (ref 0.0–0.5)
HCT: 31.6 % — ABNORMAL LOW (ref 34.8–46.6)
HGB: 10.4 g/dL — ABNORMAL LOW (ref 11.6–15.9)
LYMPH#: 0.6 10*3/uL — ABNORMAL LOW (ref 0.9–3.3)
LYMPH%: 21.4 % (ref 14.0–48.0)
MCH: 26.9 pg (ref 26.0–34.0)
MCHC: 32.9 g/dL (ref 32.0–36.0)
MCV: 82 fL (ref 81–101)
MONO#: 0.7 10*3/uL (ref 0.1–0.9)
MONO%: 24.4 % — ABNORMAL HIGH (ref 0.0–13.0)
NEUT#: 1.3 10*3/uL — ABNORMAL LOW (ref 1.5–6.5)
NEUT%: 48.1 % (ref 39.6–80.0)
Platelets: 141 10*3/uL — ABNORMAL LOW (ref 145–400)
RBC: 3.87 10*6/uL (ref 3.70–5.32)
RDW: 17.7 % — ABNORMAL HIGH (ref 11.1–15.7)
WBC: 2.7 10*3/uL — ABNORMAL LOW (ref 3.9–10.0)

## 2016-09-27 LAB — CMP (CANCER CENTER ONLY)
ALT(SGPT): 15 U/L (ref 10–47)
AST: 18 U/L (ref 11–38)
Albumin: 3.6 g/dL (ref 3.3–5.5)
Alkaline Phosphatase: 45 U/L (ref 26–84)
BUN, Bld: 20 mg/dL (ref 7–22)
CO2: 28 mEq/L (ref 18–33)
Calcium: 10 mg/dL (ref 8.0–10.3)
Chloride: 106 mEq/L (ref 98–108)
Creat: 1.2 mg/dl (ref 0.6–1.2)
Glucose, Bld: 98 mg/dL (ref 73–118)
Potassium: 3.8 mEq/L (ref 3.3–4.7)
Sodium: 142 mEq/L (ref 128–145)
Total Bilirubin: 0.6 mg/dl (ref 0.20–1.60)
Total Protein: 7.2 g/dL (ref 6.4–8.1)

## 2016-09-27 LAB — TECHNOLOGIST REVIEW CHCC SATELLITE

## 2016-09-27 MED ORDER — PROCHLORPERAZINE MALEATE 10 MG PO TABS
ORAL_TABLET | ORAL | Status: AC
  Filled 2016-09-27: qty 1

## 2016-09-27 MED ORDER — SODIUM CHLORIDE 0.9% FLUSH
10.0000 mL | INTRAVENOUS | Status: DC | PRN
  Administered 2016-09-27: 10 mL
  Filled 2016-09-27: qty 10

## 2016-09-27 MED ORDER — DEXTROSE 5 % IV SOLN
70.0000 mg/m2 | Freq: Once | INTRAVENOUS | Status: AC
  Administered 2016-09-27: 140 mg via INTRAVENOUS
  Filled 2016-09-27: qty 60

## 2016-09-27 MED ORDER — PROCHLORPERAZINE MALEATE 10 MG PO TABS
10.0000 mg | ORAL_TABLET | Freq: Once | ORAL | Status: AC
  Administered 2016-09-27: 10 mg via ORAL

## 2016-09-27 MED ORDER — HEPARIN SOD (PORK) LOCK FLUSH 100 UNIT/ML IV SOLN
500.0000 [IU] | Freq: Once | INTRAVENOUS | Status: AC | PRN
  Administered 2016-09-27: 500 [IU]
  Filled 2016-09-27: qty 5

## 2016-09-27 MED ORDER — POMALIDOMIDE 1 MG PO CAPS: 1.0000 mg | ORAL_CAPSULE | Freq: Every day | ORAL | 2 refills | Status: DC

## 2016-09-27 MED ORDER — SODIUM CHLORIDE 0.9 % IV SOLN
Freq: Once | INTRAVENOUS | Status: AC
  Administered 2016-09-27: 11:00:00 via INTRAVENOUS

## 2016-09-27 NOTE — Patient Instructions (Signed)
[facility] Discharge Instructions for Patients Receiving Chemotherapy  Today you received the following chemotherapy agents Kyrpolis.  To help prevent nausea and vomiting after your treatment, we encourage you to take your nausea medication as directed.   If you develop nausea and vomiting that is not controlled by your nausea medication, call the clinic.   BELOW ARE SYMPTOMS THAT SHOULD BE REPORTED IMMEDIATELY:  *FEVER GREATER THAN 100.5 F  *CHILLS WITH OR WITHOUT FEVER  NAUSEA AND VOMITING THAT IS NOT CONTROLLED WITH YOUR NAUSEA MEDICATION  *UNUSUAL SHORTNESS OF BREATH  *UNUSUAL BRUISING OR BLEEDING  TENDERNESS IN MOUTH AND THROAT WITH OR WITHOUT PRESENCE OF ULCERS  *URINARY PROBLEMS  *BOWEL PROBLEMS  UNUSUAL RASH Items with * indicate a potential emergency and should be followed up as soon as possible.  Feel free to call the clinic you have any questions or concerns. The clinic phone number is [phone].  Please show the [facility] at check-in to the Emergency Department and triage nurse.

## 2016-10-10 ENCOUNTER — Ambulatory Visit: Payer: BC Managed Care – PPO

## 2016-10-10 ENCOUNTER — Ambulatory Visit: Payer: BC Managed Care – PPO | Admitting: Hematology & Oncology

## 2016-10-10 ENCOUNTER — Other Ambulatory Visit: Payer: BC Managed Care – PPO

## 2016-10-12 ENCOUNTER — Other Ambulatory Visit: Payer: Self-pay | Admitting: *Deleted

## 2016-10-12 DIAGNOSIS — C9001 Multiple myeloma in remission: Secondary | ICD-10-CM

## 2016-10-13 ENCOUNTER — Encounter: Payer: Self-pay | Admitting: *Deleted

## 2016-10-13 ENCOUNTER — Other Ambulatory Visit (HOSPITAL_BASED_OUTPATIENT_CLINIC_OR_DEPARTMENT_OTHER): Payer: BC Managed Care – PPO

## 2016-10-13 ENCOUNTER — Ambulatory Visit (HOSPITAL_BASED_OUTPATIENT_CLINIC_OR_DEPARTMENT_OTHER): Payer: BC Managed Care – PPO | Admitting: Hematology & Oncology

## 2016-10-13 ENCOUNTER — Ambulatory Visit: Payer: BC Managed Care – PPO

## 2016-10-13 ENCOUNTER — Ambulatory Visit (HOSPITAL_BASED_OUTPATIENT_CLINIC_OR_DEPARTMENT_OTHER): Payer: BC Managed Care – PPO

## 2016-10-13 VITALS — Wt 192.0 lb

## 2016-10-13 DIAGNOSIS — C9000 Multiple myeloma not having achieved remission: Secondary | ICD-10-CM

## 2016-10-13 DIAGNOSIS — C541 Malignant neoplasm of endometrium: Secondary | ICD-10-CM | POA: Diagnosis not present

## 2016-10-13 DIAGNOSIS — Z79811 Long term (current) use of aromatase inhibitors: Secondary | ICD-10-CM

## 2016-10-13 DIAGNOSIS — C9001 Multiple myeloma in remission: Secondary | ICD-10-CM

## 2016-10-13 DIAGNOSIS — Z5112 Encounter for antineoplastic immunotherapy: Secondary | ICD-10-CM | POA: Diagnosis not present

## 2016-10-13 LAB — CMP (CANCER CENTER ONLY)
ALT(SGPT): 31 U/L (ref 10–47)
AST: 24 U/L (ref 11–38)
Albumin: 3.9 g/dL (ref 3.3–5.5)
Alkaline Phosphatase: 45 U/L (ref 26–84)
BUN, Bld: 18 mg/dL (ref 7–22)
CO2: 27 mEq/L (ref 18–33)
Calcium: 9.8 mg/dL (ref 8.0–10.3)
Chloride: 104 mEq/L (ref 98–108)
Creat: 1.1 mg/dl (ref 0.6–1.2)
Glucose, Bld: 92 mg/dL (ref 73–118)
Potassium: 3.7 mEq/L (ref 3.3–4.7)
Sodium: 142 mEq/L (ref 128–145)
Total Bilirubin: 0.6 mg/dl (ref 0.20–1.60)
Total Protein: 7.1 g/dL (ref 6.4–8.1)

## 2016-10-13 LAB — CBC WITH DIFFERENTIAL (CANCER CENTER ONLY)
BASO#: 0 10*3/uL (ref 0.0–0.2)
BASO%: 1 % (ref 0.0–2.0)
EOS%: 5.7 % (ref 0.0–7.0)
Eosinophils Absolute: 0.2 10*3/uL (ref 0.0–0.5)
HCT: 32.8 % — ABNORMAL LOW (ref 34.8–46.6)
HGB: 10.8 g/dL — ABNORMAL LOW (ref 11.6–15.9)
LYMPH#: 1 10*3/uL (ref 0.9–3.3)
LYMPH%: 31.1 % (ref 14.0–48.0)
MCH: 26.7 pg (ref 26.0–34.0)
MCHC: 32.9 g/dL (ref 32.0–36.0)
MCV: 81 fL (ref 81–101)
MONO#: 0.7 10*3/uL (ref 0.1–0.9)
MONO%: 21.6 % — ABNORMAL HIGH (ref 0.0–13.0)
NEUT#: 1.3 10*3/uL — ABNORMAL LOW (ref 1.5–6.5)
NEUT%: 40.6 % (ref 39.6–80.0)
Platelets: 232 10*3/uL (ref 145–400)
RBC: 4.05 10*6/uL (ref 3.70–5.32)
RDW: 17.5 % — ABNORMAL HIGH (ref 11.1–15.7)
WBC: 3.2 10*3/uL — ABNORMAL LOW (ref 3.9–10.0)

## 2016-10-13 MED ORDER — SODIUM CHLORIDE 0.9% FLUSH
10.0000 mL | INTRAVENOUS | Status: DC | PRN
  Filled 2016-10-13: qty 10

## 2016-10-13 MED ORDER — CARFILZOMIB CHEMO INJECTION 60 MG
70.0000 mg/m2 | Freq: Once | INTRAVENOUS | Status: AC
  Administered 2016-10-13: 140 mg via INTRAVENOUS
  Filled 2016-10-13: qty 60

## 2016-10-13 MED ORDER — SODIUM CHLORIDE 0.9 % IV SOLN
Freq: Once | INTRAVENOUS | Status: AC
  Administered 2016-10-13: 10:00:00 via INTRAVENOUS

## 2016-10-13 MED ORDER — HEPARIN SOD (PORK) LOCK FLUSH 100 UNIT/ML IV SOLN
500.0000 [IU] | Freq: Once | INTRAVENOUS | Status: DC | PRN
  Filled 2016-10-13: qty 5

## 2016-10-13 MED ORDER — PROCHLORPERAZINE MALEATE 10 MG PO TABS
10.0000 mg | ORAL_TABLET | Freq: Once | ORAL | Status: AC
  Administered 2016-10-13: 10 mg via ORAL

## 2016-10-13 MED ORDER — PROCHLORPERAZINE MALEATE 10 MG PO TABS
ORAL_TABLET | ORAL | Status: AC
  Filled 2016-10-13: qty 1
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
## 2016-10-13 NOTE — Patient Instructions (Signed)
Carfilzomib injection What is this medicine? CARFILZOMIB (kar FILZ oh mib) targets a specific protein within cancer cells and stops the cancer cells from growing. It is used to treat multiple myeloma. This medicine may be used for other purposes; ask your health care provider or pharmacist if you have questions. COMMON BRAND NAME(S): KYPROLIS What should I tell my health care provider before I take this medicine? They need to know if you have any of these conditions: -heart disease -history of blood clots -irregular heartbeat -kidney disease -liver disease -lung or breathing disease -an unusual or allergic reaction to carfilzomib, or other medicines, foods, dyes, or preservatives -pregnant or trying to get pregnant -breast-feeding How should I use this medicine? This medicine is for injection or infusion into a vein. It is given by a health care professional in a hospital or clinic setting. Talk to your pediatrician regarding the use of this medicine in children. Special care may be needed. Overdosage: If you think you have taken too much of this medicine contact a poison control center or emergency room at once. NOTE: This medicine is only for you. Do not share this medicine with others. What if I miss a dose? It is important not to miss your dose. Call your doctor or health care professional if you are unable to keep an appointment. What may interact with this medicine? Interactions are not expected. Give your health care provider a list of all the medicines, herbs, non-prescription drugs, or dietary supplements you use. Also tell them if you smoke, drink alcohol, or use illegal drugs. Some items may interact with your medicine. This list may not describe all possible interactions. Give your health care provider a list of all the medicines, herbs, non-prescription drugs, or dietary supplements you use. Also tell them if you smoke, drink alcohol, or use illegal drugs. Some items may  interact with your medicine. What should I watch for while using this medicine? Your condition will be monitored carefully while you are receiving this medicine. Report any side effects. Continue your course of treatment even though you feel ill unless your doctor tells you to stop. You may need blood work done while you are taking this medicine. Do not become pregnant while taking this medicine or for at least 30 days after stopping it. Women should inform their doctor if they wish to become pregnant or think they might be pregnant. There is a potential for serious side effects to an unborn child. Men should not father a child while taking this medicine and for 90 days after stopping it. Talk to your health care professional or pharmacist for more information. Do not breast-feed an infant while taking this medicine. Check with your doctor or health care professional if you get an attack of severe diarrhea, nausea and vomiting, or if you sweat a lot. The loss of too much body fluid can make it dangerous for you to take this medicine. You may get dizzy. Do not drive, use machinery, or do anything that needs mental alertness until you know how this medicine affects you. Do not stand or sit up quickly, especially if you are an older patient. This reduces the risk of dizzy or fainting spells. What side effects may I notice from receiving this medicine? Side effects that you should report to your doctor or health care professional as soon as possible: -allergic reactions like skin rash, itching or hives, swelling of the face, lips, or tongue -confusion -dizziness -feeling faint or lightheaded -fever or chills -  palpitations -seizures -signs and symptoms of bleeding such as bloody or black, tarry stools; red or dark-brown urine; spitting up blood or brown material that looks like coffee grounds; red spots on the skin; unusual bruising or bleeding including from the eye, gums, or nose -signs and symptoms of  a blood clot such as breathing problems; changes in vision; chest pain; severe, sudden headache; pain, swelling, warmth in the leg; trouble speaking; sudden numbness or weakness of the face, arm or leg -signs and symptoms of kidney injury like trouble passing urine or change in the amount of urine -signs and symptoms of liver injury like dark yellow or brown urine; general ill feeling or flu-like symptoms; light-colored stools; loss of appetite; nausea; right upper belly pain; unusually weak or tired; yellowing of the eyes or skin Side effects that usually do not require medical attention (report to your doctor or health care professional if they continue or are bothersome): -back pain -cough -diarrhea -headache -muscle cramps -vomiting This list may not describe all possible side effects. Call your doctor for medical advice about side effects. You may report side effects to FDA at 1-800-FDA-1088. Where should I keep my medicine? This drug is given in a hospital or clinic and will not be stored at home. NOTE: This sheet is a summary. It may not cover all possible information. If you have questions about this medicine, talk to your doctor, pharmacist, or health care provider.  2018 Elsevier/Gold Standard (2015-02-18 13:39:23)  

## 2016-10-17 ENCOUNTER — Ambulatory Visit: Payer: BC Managed Care – PPO

## 2016-10-17 ENCOUNTER — Other Ambulatory Visit: Payer: BC Managed Care – PPO

## 2016-10-18 ENCOUNTER — Other Ambulatory Visit: Payer: BC Managed Care – PPO

## 2016-10-18 ENCOUNTER — Ambulatory Visit: Payer: BC Managed Care – PPO

## 2016-10-19 ENCOUNTER — Ambulatory Visit: Payer: BC Managed Care – PPO

## 2016-10-19 ENCOUNTER — Other Ambulatory Visit: Payer: BC Managed Care – PPO

## 2016-10-24 ENCOUNTER — Ambulatory Visit: Payer: BC Managed Care – PPO

## 2016-10-24 ENCOUNTER — Other Ambulatory Visit: Payer: BC Managed Care – PPO

## 2016-10-27 ENCOUNTER — Ambulatory Visit (HOSPITAL_BASED_OUTPATIENT_CLINIC_OR_DEPARTMENT_OTHER): Payer: BC Managed Care – PPO

## 2016-10-27 ENCOUNTER — Other Ambulatory Visit (HOSPITAL_BASED_OUTPATIENT_CLINIC_OR_DEPARTMENT_OTHER): Payer: BC Managed Care – PPO

## 2016-10-27 ENCOUNTER — Ambulatory Visit: Payer: BC Managed Care – PPO

## 2016-10-27 VITALS — BP 118/68 | HR 90 | Temp 98.3°F

## 2016-10-27 DIAGNOSIS — C9000 Multiple myeloma not having achieved remission: Secondary | ICD-10-CM

## 2016-10-27 DIAGNOSIS — C9001 Multiple myeloma in remission: Secondary | ICD-10-CM

## 2016-10-27 DIAGNOSIS — Z5112 Encounter for antineoplastic immunotherapy: Secondary | ICD-10-CM

## 2016-10-27 LAB — CBC WITH DIFFERENTIAL (CANCER CENTER ONLY)
BASO#: 0.1 10*3/uL (ref 0.0–0.2)
BASO%: 3 % — ABNORMAL HIGH (ref 0.0–2.0)
EOS%: 2 % (ref 0.0–7.0)
Eosinophils Absolute: 0.1 10*3/uL (ref 0.0–0.5)
HCT: 33.1 % — ABNORMAL LOW (ref 34.8–46.6)
HGB: 10.9 g/dL — ABNORMAL LOW (ref 11.6–15.9)
LYMPH#: 0.9 10*3/uL (ref 0.9–3.3)
LYMPH%: 22 % (ref 14.0–48.0)
MCH: 27.1 pg (ref 26.0–34.0)
MCHC: 32.9 g/dL (ref 32.0–36.0)
MCV: 82 fL (ref 81–101)
MONO#: 0.8 10*3/uL (ref 0.1–0.9)
MONO%: 18.8 % — ABNORMAL HIGH (ref 0.0–13.0)
NEUT#: 2.2 10*3/uL (ref 1.5–6.5)
NEUT%: 54.2 % (ref 39.6–80.0)
Platelets: 276 10*3/uL (ref 145–400)
RBC: 4.02 10*6/uL (ref 3.70–5.32)
RDW: 17.9 % — ABNORMAL HIGH (ref 11.1–15.7)
WBC: 4.1 10*3/uL (ref 3.9–10.0)

## 2016-10-27 MED ORDER — HEPARIN SOD (PORK) LOCK FLUSH 100 UNIT/ML IV SOLN
500.0000 [IU] | Freq: Once | INTRAVENOUS | Status: AC | PRN
  Administered 2016-10-27: 500 [IU]
  Filled 2016-10-27: qty 5

## 2016-10-27 MED ORDER — PROCHLORPERAZINE MALEATE 10 MG PO TABS
10.0000 mg | ORAL_TABLET | Freq: Once | ORAL | Status: AC
  Administered 2016-10-27: 10 mg via ORAL

## 2016-10-27 MED ORDER — DEXTROSE 5 % IV SOLN
70.0000 mg/m2 | Freq: Once | INTRAVENOUS | Status: AC
  Administered 2016-10-27: 140 mg via INTRAVENOUS
  Filled 2016-10-27: qty 60

## 2016-10-27 MED ORDER — SODIUM CHLORIDE 0.9% FLUSH
10.0000 mL | INTRAVENOUS | Status: DC | PRN
  Administered 2016-10-27: 10 mL
  Filled 2016-10-27: qty 10

## 2016-10-27 MED ORDER — PROCHLORPERAZINE MALEATE 10 MG PO TABS
ORAL_TABLET | ORAL | Status: AC
  Filled 2016-10-27: qty 1

## 2016-10-27 MED ORDER — SODIUM CHLORIDE 0.9 % IV SOLN
Freq: Once | INTRAVENOUS | Status: AC
  Administered 2016-10-27: 16:00:00 via INTRAVENOUS

## 2016-10-27 NOTE — Patient Instructions (Signed)
[facility] Discharge Instructions for Patients Receiving Chemotherapy  Today you received the following chemotherapy agents Kyrpolis.  To help prevent nausea and vomiting after your treatment, we encourage you to take your nausea medication as directed.   If you develop nausea and vomiting that is not controlled by your nausea medication, call the clinic.   BELOW ARE SYMPTOMS THAT SHOULD BE REPORTED IMMEDIATELY:  *FEVER GREATER THAN 100.5 F  *CHILLS WITH OR WITHOUT FEVER  NAUSEA AND VOMITING THAT IS NOT CONTROLLED WITH YOUR NAUSEA MEDICATION  *UNUSUAL SHORTNESS OF BREATH  *UNUSUAL BRUISING OR BLEEDING  TENDERNESS IN MOUTH AND THROAT WITH OR WITHOUT PRESENCE OF ULCERS  *URINARY PROBLEMS  *BOWEL PROBLEMS  UNUSUAL RASH Items with * indicate a potential emergency and should be followed up as soon as possible.  Feel free to call the clinic you have any questions or concerns. The clinic phone number is [phone].  Please show the [facility] at check-in to the Emergency Department and triage nurse.

## 2016-11-08 ENCOUNTER — Other Ambulatory Visit (HOSPITAL_BASED_OUTPATIENT_CLINIC_OR_DEPARTMENT_OTHER): Payer: BC Managed Care – PPO

## 2016-11-08 ENCOUNTER — Ambulatory Visit (HOSPITAL_BASED_OUTPATIENT_CLINIC_OR_DEPARTMENT_OTHER): Payer: BC Managed Care – PPO | Admitting: Hematology & Oncology

## 2016-11-08 ENCOUNTER — Ambulatory Visit (HOSPITAL_BASED_OUTPATIENT_CLINIC_OR_DEPARTMENT_OTHER): Payer: BC Managed Care – PPO

## 2016-11-08 DIAGNOSIS — Z79811 Long term (current) use of aromatase inhibitors: Secondary | ICD-10-CM

## 2016-11-08 DIAGNOSIS — C541 Malignant neoplasm of endometrium: Secondary | ICD-10-CM

## 2016-11-08 DIAGNOSIS — C9001 Multiple myeloma in remission: Secondary | ICD-10-CM | POA: Diagnosis not present

## 2016-11-08 DIAGNOSIS — C9000 Multiple myeloma not having achieved remission: Secondary | ICD-10-CM | POA: Diagnosis not present

## 2016-11-08 DIAGNOSIS — Z5112 Encounter for antineoplastic immunotherapy: Secondary | ICD-10-CM

## 2016-11-08 LAB — CBC WITH DIFFERENTIAL (CANCER CENTER ONLY)
BASO#: 0 10*3/uL (ref 0.0–0.2)
BASO%: 1.6 % (ref 0.0–2.0)
EOS%: 7.8 % — ABNORMAL HIGH (ref 0.0–7.0)
Eosinophils Absolute: 0.2 10*3/uL (ref 0.0–0.5)
HCT: 30.8 % — ABNORMAL LOW (ref 34.8–46.6)
HGB: 10 g/dL — ABNORMAL LOW (ref 11.6–15.9)
LYMPH#: 0.7 10*3/uL — ABNORMAL LOW (ref 0.9–3.3)
LYMPH%: 25.3 % (ref 14.0–48.0)
MCH: 26.9 pg (ref 26.0–34.0)
MCHC: 32.5 g/dL (ref 32.0–36.0)
MCV: 83 fL (ref 81–101)
MONO#: 0.6 10*3/uL (ref 0.1–0.9)
MONO%: 21.8 % — ABNORMAL HIGH (ref 0.0–13.0)
NEUT#: 1.1 10*3/uL — ABNORMAL LOW (ref 1.5–6.5)
NEUT%: 43.5 % (ref 39.6–80.0)
Platelets: 217 10*3/uL (ref 145–400)
RBC: 3.72 10*6/uL (ref 3.70–5.32)
RDW: 17.8 % — ABNORMAL HIGH (ref 11.1–15.7)
WBC: 2.6 10*3/uL — ABNORMAL LOW (ref 3.9–10.0)

## 2016-11-08 LAB — CMP (CANCER CENTER ONLY)
ALT(SGPT): 20 U/L (ref 10–47)
AST: 17 U/L (ref 11–38)
Albumin: 3.7 g/dL (ref 3.3–5.5)
Alkaline Phosphatase: 42 U/L (ref 26–84)
BUN, Bld: 15 mg/dL (ref 7–22)
CO2: 26 mEq/L (ref 18–33)
Calcium: 9.6 mg/dL (ref 8.0–10.3)
Chloride: 106 mEq/L (ref 98–108)
Creat: 1.1 mg/dl (ref 0.6–1.2)
Glucose, Bld: 101 mg/dL (ref 73–118)
Potassium: 3.7 mEq/L (ref 3.3–4.7)
Sodium: 142 mEq/L (ref 128–145)
Total Bilirubin: 0.6 mg/dl (ref 0.20–1.60)
Total Protein: 6.5 g/dL (ref 6.4–8.1)

## 2016-11-08 MED ORDER — PALONOSETRON HCL INJECTION 0.25 MG/5ML
INTRAVENOUS | Status: AC
  Filled 2016-11-08: qty 5

## 2016-11-08 MED ORDER — SODIUM CHLORIDE 0.9% FLUSH
10.0000 mL | INTRAVENOUS | Status: DC | PRN
Start: 2016-11-08 — End: 2016-11-08
  Administered 2016-11-08: 10 mL
  Filled 2016-11-08: qty 10

## 2016-11-08 MED ORDER — ONDANSETRON HCL 4 MG/2ML IJ SOLN
8.0000 mg | Freq: Once | INTRAMUSCULAR | Status: AC
  Administered 2016-11-08: 8 mg via INTRAVENOUS

## 2016-11-08 MED ORDER — ONDANSETRON HCL 4 MG/2ML IJ SOLN
INTRAMUSCULAR | Status: AC
  Filled 2016-11-08: qty 4

## 2016-11-08 MED ORDER — CARFILZOMIB CHEMO INJECTION 60 MG
70.0000 mg/m2 | Freq: Once | INTRAVENOUS | Status: AC
  Administered 2016-11-08: 140 mg via INTRAVENOUS
  Filled 2016-11-08: qty 60

## 2016-11-08 MED ORDER — PROCHLORPERAZINE MALEATE 10 MG PO TABS: 10.0000 mg | ORAL_TABLET | Freq: Once | ORAL | Status: DC

## 2016-11-08 MED ORDER — HEPARIN SOD (PORK) LOCK FLUSH 100 UNIT/ML IV SOLN
500.0000 [IU] | Freq: Once | INTRAVENOUS | Status: AC | PRN
  Administered 2016-11-08: 500 [IU]
  Filled 2016-11-08: qty 5

## 2016-11-08 MED ORDER — SODIUM CHLORIDE 0.9 % IV SOLN: Freq: Once | INTRAVENOUS | Status: DC

## 2016-11-08 MED ORDER — SODIUM CHLORIDE 0.9 % IV SOLN
Freq: Once | INTRAVENOUS | Status: AC
  Administered 2016-11-08: 12:00:00 via INTRAVENOUS

## 2016-11-08 NOTE — Patient Instructions (Signed)
[facility] Discharge Instructions for Patients Receiving Chemotherapy  Today you received the following chemotherapy agents [place].  To help prevent nausea and vomiting after your treatment, we encourage you to take your nausea medication as directed.   If you develop nausea and vomiting that is not controlled by your nausea medication, call the clinic.   BELOW ARE SYMPTOMS THAT SHOULD BE REPORTED IMMEDIATELY:  *FEVER GREATER THAN 100.5 F  *CHILLS WITH OR WITHOUT FEVER  NAUSEA AND VOMITING THAT IS NOT CONTROLLED WITH YOUR NAUSEA MEDICATION  *UNUSUAL SHORTNESS OF BREATH  *UNUSUAL BRUISING OR BLEEDING  TENDERNESS IN MOUTH AND THROAT WITH OR WITHOUT PRESENCE OF ULCERS  *URINARY PROBLEMS  *BOWEL PROBLEMS  UNUSUAL RASH Items with * indicate a potential emergency and should be followed up as soon as possible.  Feel free to call the clinic you have any questions or concerns. The clinic phone number is [phone].  Please show the [facility] at check-in to the Emergency Department and triage nurse.
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
## 2016-11-09 LAB — IGG, IGA, IGM
IgA, Qn, Serum: 29 mg/dL — ABNORMAL LOW (ref 87–352)
IgG, Qn, Serum: 609 mg/dL — ABNORMAL LOW (ref 700–1600)
IgM, Qn, Serum: 10 mg/dL — ABNORMAL LOW (ref 26–217)

## 2016-11-09 LAB — KAPPA/LAMBDA LIGHT CHAINS
Ig Kappa Free Light Chain: 5.2 mg/L (ref 3.3–19.4)
Ig Lambda Free Light Chain: 15.6 mg/L (ref 5.7–26.3)
Kappa/Lambda FluidC Ratio: 0.33 (ref 0.26–1.65)

## 2016-11-10 ENCOUNTER — Telehealth: Payer: Self-pay | Admitting: *Deleted

## 2016-11-10 NOTE — Telephone Encounter (Addendum)
Patient is aware of results  ----- Message from [name], MD sent at 11/09/2016  5:35 PM EDT ----- Please call and tell her that the myeloma is down by about 70% already. Emily Greer

## 2016-11-13 ENCOUNTER — Other Ambulatory Visit: Payer: Self-pay | Admitting: Hematology & Oncology

## 2016-11-13 ENCOUNTER — Other Ambulatory Visit: Payer: Self-pay | Admitting: *Deleted

## 2016-11-13 LAB — PROTEIN ELECTROPHORESIS, SERUM, WITH REFLEX
A/G Ratio: 1.4 (ref 0.7–1.7)
Albumin: 3.7 g/dL (ref 2.9–4.4)
Alpha 1: 0.2 g/dL (ref 0.0–0.4)
Alpha 2: 0.8 g/dL (ref 0.4–1.0)
Beta: 1.1 g/dL (ref 0.7–1.3)
Gamma Globulin: 0.5 g/dL (ref 0.4–1.8)
Globulin, Total: 2.7 g/dL (ref 2.2–3.9)
Total Protein: 6.4 g/dL (ref 6.0–8.5)

## 2016-11-13 MED ORDER — POMALIDOMIDE 1 MG PO CAPS: ORAL_CAPSULE | ORAL | 0 refills | Status: DC

## 2016-11-15 ENCOUNTER — Ambulatory Visit: Payer: BC Managed Care – PPO

## 2016-11-15 ENCOUNTER — Ambulatory Visit (HOSPITAL_BASED_OUTPATIENT_CLINIC_OR_DEPARTMENT_OTHER): Payer: BC Managed Care – PPO

## 2016-11-15 ENCOUNTER — Other Ambulatory Visit (HOSPITAL_BASED_OUTPATIENT_CLINIC_OR_DEPARTMENT_OTHER): Payer: BC Managed Care – PPO

## 2016-11-15 VITALS — BP 120/62 | HR 80 | Temp 98.0°F | Resp 18

## 2016-11-15 DIAGNOSIS — C541 Malignant neoplasm of endometrium: Secondary | ICD-10-CM

## 2016-11-15 DIAGNOSIS — C9001 Multiple myeloma in remission: Secondary | ICD-10-CM

## 2016-11-15 DIAGNOSIS — C9000 Multiple myeloma not having achieved remission: Secondary | ICD-10-CM

## 2016-11-15 DIAGNOSIS — Z5112 Encounter for antineoplastic immunotherapy: Secondary | ICD-10-CM | POA: Diagnosis not present

## 2016-11-15 LAB — CBC WITH DIFFERENTIAL (CANCER CENTER ONLY)
BASO#: 0 10*3/uL (ref 0.0–0.2)
BASO%: 1.2 % (ref 0.0–2.0)
EOS%: 8 % — ABNORMAL HIGH (ref 0.0–7.0)
Eosinophils Absolute: 0.3 10*3/uL (ref 0.0–0.5)
HCT: 29.4 % — ABNORMAL LOW (ref 34.8–46.6)
HGB: 9.6 g/dL — ABNORMAL LOW (ref 11.6–15.9)
LYMPH#: 0.6 10*3/uL — ABNORMAL LOW (ref 0.9–3.3)
LYMPH%: 18 % (ref 14.0–48.0)
MCH: 27.1 pg (ref 26.0–34.0)
MCHC: 32.7 g/dL (ref 32.0–36.0)
MCV: 83 fL (ref 81–101)
MONO#: 1 10*3/uL — ABNORMAL HIGH (ref 0.1–0.9)
MONO%: 30.8 % — ABNORMAL HIGH (ref 0.0–13.0)
NEUT#: 1.4 10*3/uL — ABNORMAL LOW (ref 1.5–6.5)
NEUT%: 42 % (ref 39.6–80.0)
Platelets: 110 10*3/uL — ABNORMAL LOW (ref 145–400)
RBC: 3.54 10*6/uL — ABNORMAL LOW (ref 3.70–5.32)
RDW: 17.7 % — ABNORMAL HIGH (ref 11.1–15.7)
WBC: 3.4 10*3/uL — ABNORMAL LOW (ref 3.9–10.0)

## 2016-11-15 LAB — CMP (CANCER CENTER ONLY)
ALT(SGPT): 17 U/L (ref 10–47)
AST: 21 U/L (ref 11–38)
Albumin: 3.5 g/dL (ref 3.3–5.5)
Alkaline Phosphatase: 39 U/L (ref 26–84)
BUN, Bld: 18 mg/dL (ref 7–22)
CO2: 26 mEq/L (ref 18–33)
Calcium: 9.6 mg/dL (ref 8.0–10.3)
Chloride: 106 mEq/L (ref 98–108)
Creat: 1 mg/dl (ref 0.6–1.2)
Glucose, Bld: 98 mg/dL (ref 73–118)
Potassium: 3.1 mEq/L — ABNORMAL LOW (ref 3.3–4.7)
Sodium: 145 mEq/L (ref 128–145)
Total Bilirubin: 0.6 mg/dl (ref 0.20–1.60)
Total Protein: 6.6 g/dL (ref 6.4–8.1)

## 2016-11-15 MED ORDER — DEXTROSE 5 % IV SOLN
56.0000 mg/m2 | Freq: Once | INTRAVENOUS | Status: AC
  Administered 2016-11-15: 112 mg via INTRAVENOUS
  Filled 2016-11-15: qty 56

## 2016-11-15 MED ORDER — SODIUM CHLORIDE 0.9 % IV SOLN: Freq: Once | INTRAVENOUS | Status: DC

## 2016-11-15 MED ORDER — DENOSUMAB 120 MG/1.7ML ~~LOC~~ SOLN
SUBCUTANEOUS | Status: AC
  Filled 2016-11-15: qty 1.7

## 2016-11-15 MED ORDER — SODIUM CHLORIDE 0.9 % IV SOLN
Freq: Once | INTRAVENOUS | Status: AC
  Administered 2016-11-15: 13:00:00 via INTRAVENOUS

## 2016-11-15 MED ORDER — SODIUM CHLORIDE 0.9 % IV SOLN
Freq: Once | INTRAVENOUS | Status: AC
  Administered 2016-11-15: 13:00:00 via INTRAVENOUS
  Filled 2016-11-15: qty 8

## 2016-11-15 MED ORDER — HEPARIN SOD (PORK) LOCK FLUSH 100 UNIT/ML IV SOLN
500.0000 [IU] | Freq: Once | INTRAVENOUS | Status: AC | PRN
  Administered 2016-11-15: 500 [IU]
  Filled 2016-11-15: qty 5

## 2016-11-15 MED ORDER — SODIUM CHLORIDE 0.9% FLUSH
10.0000 mL | INTRAVENOUS | Status: DC | PRN
  Administered 2016-11-15: 10 mL
  Filled 2016-11-15: qty 10

## 2016-11-15 MED ORDER — DENOSUMAB 120 MG/1.7ML ~~LOC~~ SOLN
120.0000 mg | Freq: Once | SUBCUTANEOUS | Status: AC
  Administered 2016-11-15: 120 mg via SUBCUTANEOUS

## 2016-11-15 NOTE — Patient Instructions (Signed)
Implanted Port Insertion, Care After °This sheet gives you information about how to care for yourself after your procedure. Your health care provider may also give you more specific instructions. If you have problems or questions, contact your health care provider. °What can I expect after the procedure? °After your procedure, it is common to have: °· Discomfort at the port insertion site. °· Bruising on the skin over the port. This should improve over 3-4 days. ° °Follow these instructions at home: °Port care °· After your port is placed, you will get a manufacturer's information card. The card has information about your port. Keep this card with you at all times. °· Take care of the port as told by your health care provider. Ask your health care provider if you or a family member can get training for taking care of the port at home. A home health care nurse may also take care of the port. °· Make sure to remember what type of port you have. °Incision care °· Follow instructions from your health care provider about how to take care of your port insertion site. Make sure you: °? Wash your hands with soap and water before you change your bandage (dressing). If soap and water are not available, use hand sanitizer. °? Change your dressing as told by your health care provider. °? Leave stitches (sutures), skin glue, or adhesive strips in place. These skin closures may need to stay in place for 2 weeks or longer. If adhesive strip edges start to loosen and curl up, you may trim the loose edges. Do not remove adhesive strips completely unless your health care provider tells you to do that. °· Check your port insertion site every day for signs of infection. Check for: °? More redness, swelling, or pain. °? More fluid or blood. °? Warmth. °? Pus or a bad smell. °General instructions °· Do not take baths, swim, or use a hot tub until your health care provider approves. °· Do not lift anything that is heavier than 10 lb (4.5  kg) for a week, or as told by your health care provider. °· Ask your health care provider when it is okay to: °? Return to work or school. °? Resume usual physical activities or sports. °· Do not drive for 24 hours if you were given a medicine to help you relax (sedative). °· Take over-the-counter and prescription medicines only as told by your health care provider. °· Wear a medical alert bracelet in case of an emergency. This will tell any health care providers that you have a port. °· Keep all follow-up visits as told by your health care provider. This is important. °Contact a health care provider if: °· You cannot flush your port with saline as directed, or you cannot draw blood from the port. °· You have a fever or chills. °· You have more redness, swelling, or pain around your port insertion site. °· You have more fluid or blood coming from your port insertion site. °· Your port insertion site feels warm to the touch. °· You have pus or a bad smell coming from the port insertion site. °Get help right away if: °· You have chest pain or shortness of breath. °· You have bleeding from your port that you cannot control. °Summary °· Take care of the port as told by your health care provider. °· Change your dressing as told by your health care provider. °· Keep all follow-up visits as told by your health care provider. °  This information is not intended to replace advice given to you by your health care provider. Make sure you discuss any questions you have with your health care provider. °Document Released: 11/06/2012 Document Revised: 12/08/2015 Document Reviewed: 12/08/2015 °Elsevier Interactive Patient Education © 2017 Elsevier Inc[employer] ° °

## 2016-11-15 NOTE — Patient Instructions (Signed)
[facility] Discharge Instructions for Patients Receiving Chemotherapy  Today you received the following chemotherapy agents Kyprolis  To help prevent nausea and vomiting after your treatment, we encourage you to take your nausea medication    If you develop nausea and vomiting that is not controlled by your nausea medication, call the clinic.   BELOW ARE SYMPTOMS THAT SHOULD BE REPORTED IMMEDIATELY: *FEVER GREATER THAN 100.5 F *CHILLS WITH OR WITHOUT FEVER NAUSEA AND VOMITING THAT IS NOT CONTROLLED WITH YOUR NAUSEA MEDICATION *UNUSUAL SHORTNESS OF BREATH *UNUSUAL BRUISING OR BLEEDING TENDERNESS IN MOUTH AND THROAT WITH OR WITHOUT PRESENCE OF ULCERS *URINARY PROBLEMS *BOWEL PROBLEMS UNUSUAL RASH Items with * indicate a potential emergency and should be followed up as soon as possible.  Feel free to call the clinic should you have any questions or concerns. The clinic phone number is [phone].  Please show the CHEMO ALERT CARD at check-in to the Emergency Department and triage nurse.   

## 2016-11-24 ENCOUNTER — Other Ambulatory Visit: Payer: Self-pay

## 2016-11-24 DIAGNOSIS — C9001 Multiple myeloma in remission: Secondary | ICD-10-CM

## 2016-11-27 ENCOUNTER — Ambulatory Visit: Payer: BC Managed Care – PPO

## 2016-11-27 ENCOUNTER — Other Ambulatory Visit: Payer: Self-pay | Admitting: *Deleted

## 2016-11-27 ENCOUNTER — Ambulatory Visit (HOSPITAL_BASED_OUTPATIENT_CLINIC_OR_DEPARTMENT_OTHER): Payer: BC Managed Care – PPO

## 2016-11-27 ENCOUNTER — Other Ambulatory Visit (HOSPITAL_BASED_OUTPATIENT_CLINIC_OR_DEPARTMENT_OTHER): Payer: BC Managed Care – PPO

## 2016-11-27 DIAGNOSIS — C9000 Multiple myeloma not having achieved remission: Secondary | ICD-10-CM

## 2016-11-27 DIAGNOSIS — Z5112 Encounter for antineoplastic immunotherapy: Secondary | ICD-10-CM

## 2016-11-27 DIAGNOSIS — C9001 Multiple myeloma in remission: Secondary | ICD-10-CM

## 2016-11-27 LAB — CMP (CANCER CENTER ONLY)
ALT(SGPT): 19 U/L (ref 10–47)
AST: 19 U/L (ref 11–38)
Albumin: 3.6 g/dL (ref 3.3–5.5)
Alkaline Phosphatase: 39 U/L (ref 26–84)
BUN, Bld: 16 mg/dL (ref 7–22)
CO2: 26 mEq/L (ref 18–33)
Calcium: 9.4 mg/dL (ref 8.0–10.3)
Chloride: 107 mEq/L (ref 98–108)
Creat: 1.6 mg/dl — ABNORMAL HIGH (ref 0.6–1.2)
Glucose, Bld: 98 mg/dL (ref 73–118)
Potassium: 3.2 mEq/L — ABNORMAL LOW (ref 3.3–4.7)
Sodium: 142 mEq/L (ref 128–145)
Total Bilirubin: 0.7 mg/dl (ref 0.20–1.60)
Total Protein: 6.3 g/dL — ABNORMAL LOW (ref 6.4–8.1)

## 2016-11-27 LAB — CBC WITH DIFFERENTIAL (CANCER CENTER ONLY)
BASO#: 0.1 10*3/uL (ref 0.0–0.2)
BASO%: 3.9 % — ABNORMAL HIGH (ref 0.0–2.0)
EOS%: 2.9 % (ref 0.0–7.0)
Eosinophils Absolute: 0.1 10*3/uL (ref 0.0–0.5)
HCT: 28.3 % — ABNORMAL LOW (ref 34.8–46.6)
HGB: 9.2 g/dL — ABNORMAL LOW (ref 11.6–15.9)
LYMPH#: 0.9 10*3/uL (ref 0.9–3.3)
LYMPH%: 28.9 % (ref 14.0–48.0)
MCH: 28 pg (ref 26.0–34.0)
MCHC: 32.5 g/dL (ref 32.0–36.0)
MCV: 86 fL (ref 81–101)
MONO#: 0.5 10*3/uL (ref 0.1–0.9)
MONO%: 17.2 % — ABNORMAL HIGH (ref 0.0–13.0)
NEUT#: 1.5 10*3/uL (ref 1.5–6.5)
NEUT%: 47.1 % (ref 39.6–80.0)
Platelets: 219 10*3/uL (ref 145–400)
RBC: 3.28 10*6/uL — ABNORMAL LOW (ref 3.70–5.32)
RDW: 18.6 % — ABNORMAL HIGH (ref 11.1–15.7)
WBC: 3.1 10*3/uL — ABNORMAL LOW (ref 3.9–10.0)

## 2016-11-27 MED ORDER — SODIUM CHLORIDE 0.9 % IV SOLN
Freq: Once | INTRAVENOUS | Status: AC
  Administered 2016-11-27: 13:00:00 via INTRAVENOUS
  Filled 2016-11-27: qty 8

## 2016-11-27 MED ORDER — SODIUM CHLORIDE 0.9% FLUSH
10.0000 mL | INTRAVENOUS | Status: DC | PRN
  Administered 2016-11-27: 10 mL
  Filled 2016-11-27: qty 10

## 2016-11-27 MED ORDER — HEPARIN SOD (PORK) LOCK FLUSH 100 UNIT/ML IV SOLN
500.0000 [IU] | Freq: Once | INTRAVENOUS | Status: AC | PRN
  Administered 2016-11-27: 500 [IU]
  Filled 2016-11-27: qty 5

## 2016-11-27 MED ORDER — DEXTROSE 5 % IV SOLN
56.0000 mg/m2 | Freq: Once | INTRAVENOUS | Status: AC
  Administered 2016-11-27: 112 mg via INTRAVENOUS
  Filled 2016-11-27: qty 56

## 2016-11-27 MED ORDER — GRANISETRON 3.1 MG/24HR TD PTCH: MEDICATED_PATCH | TRANSDERMAL | 0 refills | Status: DC

## 2016-11-27 MED ORDER — SODIUM CHLORIDE 0.9 % IV SOLN
Freq: Once | INTRAVENOUS | Status: AC
  Administered 2016-11-27: 12:00:00 via INTRAVENOUS

## 2016-11-27 NOTE — Patient Instructions (Signed)
Implanted Port Home Guide An implanted port is a type of central line that is placed under the skin. Central lines are used to provide IV access when treatment or nutrition needs to be given through a person's veins. Implanted ports are used for long-term IV access. An implanted port may be placed because:  You need IV medicine that would be irritating to the small veins in your hands or arms.  You need long-term IV medicines, such as antibiotics.  You need IV nutrition for a long period.  You need frequent blood draws for lab tests.  You need dialysis.  Implanted ports are usually placed in the chest area, but they can also be placed in the upper arm, the abdomen, or the leg. An implanted port has two main parts:  Reservoir. The reservoir is round and will appear as a small, raised area under your skin. The reservoir is the part where a needle is inserted to give medicines or draw blood.  Catheter. The catheter is a thin, flexible tube that extends from the reservoir. The catheter is placed into a large vein. Medicine that is inserted into the reservoir goes into the catheter and then into the vein.  How will I care for my incision site? Do not get the incision site wet. Bathe or shower as directed by your health care provider. How is my port accessed? Special steps must be taken to access the port:  Before the port is accessed, a numbing cream can be placed on the skin. This helps numb the skin over the port site.  Your health care provider uses a sterile technique to access the port. ? Your health care provider must put on a mask and sterile gloves. ? The skin over your port is cleaned carefully with an antiseptic and allowed to dry. ? The port is gently pinched between sterile gloves, and a needle is inserted into the port.  Only "non-coring" port needles should be used to access the port. Once the port is accessed, a blood return should be checked. This helps ensure that the port  is in the vein and is not clogged.  If your port needs to remain accessed for a constant infusion, a clear (transparent) bandage will be placed over the needle site. The bandage and needle will need to be changed every week, or as directed by your health care provider.  Keep the bandage covering the needle clean and dry. Do not get it wet. Follow your health care provider's instructions on how to take a shower or bath while the port is accessed.  If your port does not need to stay accessed, no bandage is needed over the port.  What is flushing? Flushing helps keep the port from getting clogged. Follow your health care provider's instructions on how and when to flush the port. Ports are usually flushed with saline solution or a medicine called heparin. The need for flushing will depend on how the port is used.  If the port is used for intermittent medicines or blood draws, the port will need to be flushed: ? After medicines have been given. ? After blood has been drawn. ? As part of routine maintenance.  If a constant infusion is running, the port may not need to be flushed.  How long will my port stay implanted? The port can stay in for as long as your health care provider thinks it is needed. When it is time for the port to come out, surgery will be   done to remove it. The procedure is similar to the one performed when the port was put in. When should I seek immediate medical care? When you have an implanted port, you should seek immediate medical care if:  You notice a bad smell coming from the incision site.  You have swelling, redness, or drainage at the incision site.  You have more swelling or pain at the port site or the surrounding area.  You have a fever that is not controlled with medicine.  This information is not intended to replace advice given to you by your health care provider. Make sure you discuss any questions you have with your health care provider. Document  Released: 01/16/2005 Document Revised: 06/24/2015 Document Reviewed: 09/23/2012 Elsevier Interactive Patient Education  2017 Elsevier Inc.  

## 2016-11-27 NOTE — Patient Instructions (Signed)
[facility] Discharge Instructions for Patients Receiving Chemotherapy  Today you received the following chemotherapy agents:  Kyprolis  To help prevent nausea and vomiting after your treatment, we encourage you to take your nausea medication as ordered per MD.    If you develop nausea and vomiting that is not controlled by your nausea medication, call the clinic.   BELOW ARE SYMPTOMS THAT SHOULD BE REPORTED IMMEDIATELY:  *FEVER GREATER THAN 100.5 F  *CHILLS WITH OR WITHOUT FEVER  NAUSEA AND VOMITING THAT IS NOT CONTROLLED WITH YOUR NAUSEA MEDICATION  *UNUSUAL SHORTNESS OF BREATH  *UNUSUAL BRUISING OR BLEEDING  TENDERNESS IN MOUTH AND THROAT WITH OR WITHOUT PRESENCE OF ULCERS  *URINARY PROBLEMS  *BOWEL PROBLEMS  UNUSUAL RASH Items with * indicate a potential emergency and should be followed up as soon as possible.  Feel free to call the clinic should you have any questions or concerns. The clinic phone number is [phone].  Please show the CHEMO ALERT CARD at check-in to the Emergency Department and triage nurse.   

## 2016-11-27 NOTE — Progress Notes (Signed)
OK to treat today with creat-1.6 per Dr. Marin Olp.

## 2016-11-29 ENCOUNTER — Other Ambulatory Visit: Payer: BC Managed Care – PPO

## 2016-11-29 ENCOUNTER — Ambulatory Visit: Payer: BC Managed Care – PPO

## 2016-12-01 ENCOUNTER — Other Ambulatory Visit: Payer: Self-pay | Admitting: *Deleted

## 2016-12-01 DIAGNOSIS — C9001 Multiple myeloma in remission: Secondary | ICD-10-CM

## 2016-12-04 ENCOUNTER — Other Ambulatory Visit (HOSPITAL_BASED_OUTPATIENT_CLINIC_OR_DEPARTMENT_OTHER): Payer: BC Managed Care – PPO

## 2016-12-04 ENCOUNTER — Ambulatory Visit (HOSPITAL_BASED_OUTPATIENT_CLINIC_OR_DEPARTMENT_OTHER): Payer: BC Managed Care – PPO

## 2016-12-04 ENCOUNTER — Ambulatory Visit: Payer: BC Managed Care – PPO

## 2016-12-04 ENCOUNTER — Ambulatory Visit (HOSPITAL_BASED_OUTPATIENT_CLINIC_OR_DEPARTMENT_OTHER): Payer: BC Managed Care – PPO | Admitting: Hematology & Oncology

## 2016-12-04 DIAGNOSIS — Z79811 Long term (current) use of aromatase inhibitors: Secondary | ICD-10-CM

## 2016-12-04 DIAGNOSIS — C9001 Multiple myeloma in remission: Secondary | ICD-10-CM

## 2016-12-04 DIAGNOSIS — Z923 Personal history of irradiation: Secondary | ICD-10-CM

## 2016-12-04 DIAGNOSIS — Z5112 Encounter for antineoplastic immunotherapy: Secondary | ICD-10-CM | POA: Diagnosis not present

## 2016-12-04 DIAGNOSIS — C9002 Multiple myeloma in relapse: Secondary | ICD-10-CM

## 2016-12-04 DIAGNOSIS — C9000 Multiple myeloma not having achieved remission: Secondary | ICD-10-CM

## 2016-12-04 DIAGNOSIS — C541 Malignant neoplasm of endometrium: Secondary | ICD-10-CM

## 2016-12-04 LAB — CMP (CANCER CENTER ONLY)
ALT(SGPT): 16 U/L (ref 10–47)
AST: 15 U/L (ref 11–38)
Albumin: 3.8 g/dL (ref 3.3–5.5)
Alkaline Phosphatase: 40 U/L (ref 26–84)
BUN, Bld: 18 mg/dL (ref 7–22)
CO2: 27 mEq/L (ref 18–33)
Calcium: 9.3 mg/dL (ref 8.0–10.3)
Chloride: 106 mEq/L (ref 98–108)
Creat: 1.2 mg/dl (ref 0.6–1.2)
Glucose, Bld: 84 mg/dL (ref 73–118)
Potassium: 3.5 mEq/L (ref 3.3–4.7)
Sodium: 142 mEq/L (ref 128–145)
Total Bilirubin: 0.7 mg/dl (ref 0.20–1.60)
Total Protein: 6.5 g/dL (ref 6.4–8.1)

## 2016-12-04 LAB — CBC WITH DIFFERENTIAL (CANCER CENTER ONLY)
BASO#: 0 10*3/uL (ref 0.0–0.2)
BASO%: 1 % (ref 0.0–2.0)
EOS%: 3.2 % (ref 0.0–7.0)
Eosinophils Absolute: 0.1 10*3/uL (ref 0.0–0.5)
HCT: 30.2 % — ABNORMAL LOW (ref 34.8–46.6)
HGB: 9.7 g/dL — ABNORMAL LOW (ref 11.6–15.9)
LYMPH#: 0.8 10*3/uL — ABNORMAL LOW (ref 0.9–3.3)
LYMPH%: 24.2 % (ref 14.0–48.0)
MCH: 28 pg (ref 26.0–34.0)
MCHC: 32.1 g/dL (ref 32.0–36.0)
MCV: 87 fL (ref 81–101)
MONO#: 0.7 10*3/uL (ref 0.1–0.9)
MONO%: 21.3 % — ABNORMAL HIGH (ref 0.0–13.0)
NEUT#: 1.6 10*3/uL (ref 1.5–6.5)
NEUT%: 50.3 % (ref 39.6–80.0)
Platelets: 153 10*3/uL (ref 145–400)
RBC: 3.46 10*6/uL — ABNORMAL LOW (ref 3.70–5.32)
RDW: 18.7 % — ABNORMAL HIGH (ref 11.1–15.7)
WBC: 3.1 10*3/uL — ABNORMAL LOW (ref 3.9–10.0)

## 2016-12-04 MED ORDER — SODIUM CHLORIDE 0.9% FLUSH
10.0000 mL | INTRAVENOUS | Status: DC | PRN
  Administered 2016-12-04: 10 mL
  Filled 2016-12-04: qty 10

## 2016-12-04 MED ORDER — SODIUM CHLORIDE 0.9 % IV SOLN
Freq: Once | INTRAVENOUS | Status: AC
  Administered 2016-12-04: 14:00:00 via INTRAVENOUS

## 2016-12-04 MED ORDER — HEPARIN SOD (PORK) LOCK FLUSH 100 UNIT/ML IV SOLN
500.0000 [IU] | Freq: Once | INTRAVENOUS | Status: AC | PRN
  Administered 2016-12-04: 500 [IU]
  Filled 2016-12-04: qty 5

## 2016-12-04 MED ORDER — DEXTROSE 5 % IV SOLN
56.0000 mg/m2 | Freq: Once | INTRAVENOUS | Status: AC
  Administered 2016-12-04: 112 mg via INTRAVENOUS
  Filled 2016-12-04: qty 56

## 2016-12-04 MED ORDER — SODIUM CHLORIDE 0.9 % IV SOLN
Freq: Once | INTRAVENOUS | Status: AC
  Administered 2016-12-04: 14:00:00 via INTRAVENOUS
  Filled 2016-12-04: qty 8

## 2016-12-04 MED ORDER — SODIUM CHLORIDE 0.9 % IV SOLN: Freq: Once | INTRAVENOUS | Status: DC
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
## 2016-12-06 ENCOUNTER — Ambulatory Visit: Payer: BC Managed Care – PPO | Admitting: Hematology & Oncology

## 2016-12-06 ENCOUNTER — Other Ambulatory Visit: Payer: BC Managed Care – PPO

## 2016-12-06 ENCOUNTER — Ambulatory Visit: Payer: BC Managed Care – PPO

## 2016-12-11 ENCOUNTER — Other Ambulatory Visit: Payer: BC Managed Care – PPO

## 2016-12-18 ENCOUNTER — Other Ambulatory Visit: Payer: Self-pay | Admitting: Hematology & Oncology

## 2016-12-19 ENCOUNTER — Other Ambulatory Visit: Payer: Self-pay | Admitting: *Deleted

## 2016-12-19 MED ORDER — POMALIDOMIDE 1 MG PO CAPS: ORAL_CAPSULE | ORAL | 0 refills | Status: DC

## 2016-12-25 ENCOUNTER — Other Ambulatory Visit: Payer: Self-pay

## 2016-12-25 ENCOUNTER — Other Ambulatory Visit: Payer: Self-pay | Admitting: Oncology

## 2016-12-25 ENCOUNTER — Ambulatory Visit: Payer: BC Managed Care – PPO | Admitting: Hematology & Oncology

## 2016-12-25 ENCOUNTER — Encounter: Payer: Self-pay | Admitting: Hematology & Oncology

## 2016-12-25 ENCOUNTER — Other Ambulatory Visit (HOSPITAL_BASED_OUTPATIENT_CLINIC_OR_DEPARTMENT_OTHER): Payer: BC Managed Care – PPO

## 2016-12-25 ENCOUNTER — Ambulatory Visit: Payer: BC Managed Care – PPO

## 2016-12-25 ENCOUNTER — Ambulatory Visit (HOSPITAL_BASED_OUTPATIENT_CLINIC_OR_DEPARTMENT_OTHER): Payer: BC Managed Care – PPO

## 2016-12-25 VITALS — BP 127/68 | HR 98 | Temp 98.3°F | Resp 18 | Wt 190.0 lb

## 2016-12-25 DIAGNOSIS — C541 Malignant neoplasm of endometrium: Secondary | ICD-10-CM

## 2016-12-25 DIAGNOSIS — C9000 Multiple myeloma not having achieved remission: Secondary | ICD-10-CM | POA: Diagnosis not present

## 2016-12-25 DIAGNOSIS — C9001 Multiple myeloma in remission: Secondary | ICD-10-CM

## 2016-12-25 DIAGNOSIS — Z79811 Long term (current) use of aromatase inhibitors: Secondary | ICD-10-CM | POA: Diagnosis not present

## 2016-12-25 DIAGNOSIS — Z923 Personal history of irradiation: Secondary | ICD-10-CM | POA: Diagnosis not present

## 2016-12-25 DIAGNOSIS — C9002 Multiple myeloma in relapse: Secondary | ICD-10-CM

## 2016-12-25 DIAGNOSIS — Z5112 Encounter for antineoplastic immunotherapy: Secondary | ICD-10-CM | POA: Diagnosis not present

## 2016-12-25 LAB — CBC WITH DIFFERENTIAL (CANCER CENTER ONLY)
BASO#: 0 10*3/uL (ref 0.0–0.2)
BASO%: 0.7 % (ref 0.0–2.0)
EOS%: 4.7 % (ref 0.0–7.0)
Eosinophils Absolute: 0.2 10*3/uL (ref 0.0–0.5)
HCT: 31.3 % — ABNORMAL LOW (ref 34.8–46.6)
HGB: 9.9 g/dL — ABNORMAL LOW (ref 11.6–15.9)
LYMPH#: 0.9 10*3/uL (ref 0.9–3.3)
LYMPH%: 19.8 % (ref 14.0–48.0)
MCH: 27.8 pg (ref 26.0–34.0)
MCHC: 31.6 g/dL — ABNORMAL LOW (ref 32.0–36.0)
MCV: 88 fL (ref 81–101)
MONO#: 0.4 10*3/uL (ref 0.1–0.9)
MONO%: 10.3 % (ref 0.0–13.0)
NEUT#: 2.8 10*3/uL (ref 1.5–6.5)
NEUT%: 64.5 % (ref 39.6–80.0)
Platelets: 296 10*3/uL (ref 145–400)
RBC: 3.56 10*6/uL — ABNORMAL LOW (ref 3.70–5.32)
RDW: 16.9 % — ABNORMAL HIGH (ref 11.1–15.7)
WBC: 4.3 10*3/uL (ref 3.9–10.0)

## 2016-12-25 LAB — CMP (CANCER CENTER ONLY)
ALT(SGPT): 21 U/L (ref 10–47)
AST: 18 U/L (ref 11–38)
Albumin: 3.8 g/dL (ref 3.3–5.5)
Alkaline Phosphatase: 55 U/L (ref 26–84)
BUN, Bld: 12 mg/dL (ref 7–22)
CO2: 29 mEq/L (ref 18–33)
Calcium: 9.3 mg/dL (ref 8.0–10.3)
Chloride: 101 mEq/L (ref 98–108)
Creat: 1.1 mg/dl (ref 0.6–1.2)
Glucose, Bld: 94 mg/dL (ref 73–118)
Potassium: 3.1 mEq/L — ABNORMAL LOW (ref 3.3–4.7)
Sodium: 144 mEq/L (ref 128–145)
Total Bilirubin: 0.5 mg/dl (ref 0.20–1.60)
Total Protein: 6.7 g/dL (ref 6.4–8.1)

## 2016-12-25 MED ORDER — SODIUM CHLORIDE 0.9 % IV SOLN
Freq: Once | INTRAVENOUS | Status: AC
  Administered 2016-12-25: 13:00:00 via INTRAVENOUS
  Filled 2016-12-25: qty 8

## 2016-12-25 MED ORDER — DEXTROSE 5 % IV SOLN
56.0000 mg/m2 | Freq: Once | INTRAVENOUS | Status: AC
  Administered 2016-12-25: 112 mg via INTRAVENOUS
  Filled 2016-12-25: qty 56

## 2016-12-25 MED ORDER — GRANISETRON 3.1 MG/24HR TD PTCH: MEDICATED_PATCH | TRANSDERMAL | 0 refills | Status: DC

## 2016-12-25 MED ORDER — HEPARIN SOD (PORK) LOCK FLUSH 100 UNIT/ML IV SOLN
500.0000 [IU] | Freq: Once | INTRAVENOUS | Status: AC | PRN
  Administered 2016-12-25: 500 [IU]
  Filled 2016-12-25: qty 5

## 2016-12-25 MED ORDER — SODIUM CHLORIDE 0.9% FLUSH
10.0000 mL | INTRAVENOUS | Status: DC | PRN
  Administered 2016-12-25: 10 mL
  Filled 2016-12-25: qty 10

## 2016-12-25 MED ORDER — SODIUM CHLORIDE 0.9 % IV SOLN
Freq: Once | INTRAVENOUS | Status: AC
  Administered 2016-12-25: 12:00:00 via INTRAVENOUS

## 2016-12-25 MED ORDER — SODIUM CHLORIDE 0.9% FLUSH
10.0000 mL | Freq: Once | INTRAVENOUS | Status: AC
  Administered 2016-12-25: 10 mL
  Filled 2016-12-25: qty 10

## 2016-12-25 NOTE — Patient Instructions (Signed)
[facility] Discharge Instructions for Patients Receiving Chemotherapy  Today you received the following chemotherapy agents:  Kyprolis  To help prevent nausea and vomiting after your treatment, we encourage you to take your nausea medication as prescribed.   If you develop nausea and vomiting that is not controlled by your nausea medication, call the clinic.   BELOW ARE SYMPTOMS THAT SHOULD BE REPORTED IMMEDIATELY:  *FEVER GREATER THAN 100.5 F  *CHILLS WITH OR WITHOUT FEVER  NAUSEA AND VOMITING THAT IS NOT CONTROLLED WITH YOUR NAUSEA MEDICATION  *UNUSUAL SHORTNESS OF BREATH  *UNUSUAL BRUISING OR BLEEDING  TENDERNESS IN MOUTH AND THROAT WITH OR WITHOUT PRESENCE OF ULCERS  *URINARY PROBLEMS  *BOWEL PROBLEMS  UNUSUAL RASH Items with * indicate a potential emergency and should be followed up as soon as possible.  Feel free to call the clinic should you have any questions or concerns. The clinic phone number is [phone].  Please show the CHEMO ALERT CARD at check-in to the Emergency Department and triage nurse.   

## 2016-12-25 NOTE — Patient Instructions (Signed)
Implanted Port Home Guide An implanted port is a type of central line that is placed under the skin. Central lines are used to provide IV access when treatment or nutrition needs to be given through a person's veins. Implanted ports are used for long-term IV access. An implanted port may be placed because:  You need IV medicine that would be irritating to the small veins in your hands or arms.  You need long-term IV medicines, such as antibiotics.  You need IV nutrition for a long period.  You need frequent blood draws for lab tests.  You need dialysis.  Implanted ports are usually placed in the chest area, but they can also be placed in the upper arm, the abdomen, or the leg. An implanted port has two main parts:  Reservoir. The reservoir is round and will appear as a small, raised area under your skin. The reservoir is the part where a needle is inserted to give medicines or draw blood.  Catheter. The catheter is a thin, flexible tube that extends from the reservoir. The catheter is placed into a large vein. Medicine that is inserted into the reservoir goes into the catheter and then into the vein.  How will I care for my incision site? Do not get the incision site wet. Bathe or shower as directed by your health care provider. How is my port accessed? Special steps must be taken to access the port:  Before the port is accessed, a numbing cream can be placed on the skin. This helps numb the skin over the port site.  Your health care provider uses a sterile technique to access the port. ? Your health care provider must put on a mask and sterile gloves. ? The skin over your port is cleaned carefully with an antiseptic and allowed to dry. ? The port is gently pinched between sterile gloves, and a needle is inserted into the port.  Only "non-coring" port needles should be used to access the port. Once the port is accessed, a blood return should be checked. This helps ensure that the port  is in the vein and is not clogged.  If your port needs to remain accessed for a constant infusion, a clear (transparent) bandage will be placed over the needle site. The bandage and needle will need to be changed every week, or as directed by your health care provider.  Keep the bandage covering the needle clean and dry. Do not get it wet. Follow your health care provider's instructions on how to take a shower or bath while the port is accessed.  If your port does not need to stay accessed, no bandage is needed over the port.  What is flushing? Flushing helps keep the port from getting clogged. Follow your health care provider's instructions on how and when to flush the port. Ports are usually flushed with saline solution or a medicine called heparin. The need for flushing will depend on how the port is used.  If the port is used for intermittent medicines or blood draws, the port will need to be flushed: ? After medicines have been given. ? After blood has been drawn. ? As part of routine maintenance.  If a constant infusion is running, the port may not need to be flushed.  How long will my port stay implanted? The port can stay in for as long as your health care provider thinks it is needed. When it is time for the port to come out, surgery will be   done to remove it. The procedure is similar to the one performed when the port was put in. When should I seek immediate medical care? When you have an implanted port, you should seek immediate medical care if:  You notice a bad smell coming from the incision site.  You have swelling, redness, or drainage at the incision site.  You have more swelling or pain at the port site or the surrounding area.  You have a fever that is not controlled with medicine.  This information is not intended to replace advice given to you by your health care provider. Make sure you discuss any questions you have with your health care provider. Document  Released: 01/16/2005 Document Revised: 06/24/2015 Document Reviewed: 09/23/2012 Elsevier Interactive Patient Education  2017 Elsevier Inc.  

## 2016-12-25 NOTE — Progress Notes (Signed)
Hematology and Oncology Follow Up Visit  KELSEIGH FRAME [id number] 1950-12-11 66 y.o. 12/25/2016   Principle Diagnosis:   Recurrent lambda light chain myeloma  History of recurrent endometrial carcinoma  Current Therapy:    Status post second autologous stem cell transplant on 07/24/2014  Maintenance therapy with Pomalidomide/every 2 week Velcade - d/c'ed  Xgeva 120 mg subcutaneous every 3 next dose in October 2018  Radiation therapy for endometrial recurrence - completed 04/20/2015  Femara 2.5mg  po q day       Pomalyst/Kyprolis 70mg /m2 IV q 2 weeks -start on         12/04/2016       Interim History:  Ms.  Creppel is back for followup. She is doing okay.  Everything seems to be doing okay with [name].  She had a very nice Thanksgiving.  She and her husband were up in [place] with her son and his family.  Their daughter came up from [place].  She is still having to go to [place] to help her elderly parents.  We switched her to every 2-week treatment because she is done so well.  I do not have her myeloma studies back as of yet.  She has had no problems with fever.  She has had no cough.  She has had no rashes.  She has had no leg swelling.  She has had no bleeding.  Her endometrial cancer is being followed by Dr. Andrey Farmer of gynecologic oncology.  I think she sees her soon.  She is on Femara for the uterine cancer.  She has had no arthralgias or myalgias.    Overall, her performance status is ECOG 1.  .  Medications: Allergies:  Allergies  Allergen Reactions  . Codeine Nausea Only    Past Medical History, Surgical history, Social history, and Family History were reviewed and updated.  Review of Systems: As stated in the interim history  Physical Exam:  weight is 190 lb (86.2 kg). Her oral temperature is 98.3 F (36.8 C). Her blood pressure is 127/68 and her pulse is 98. Her respiration is 18 and oxygen saturation is 98%.   Physical Exam    Constitutional: She is oriented to person, place, and time.  HENT:  Head: Normocephalic and atraumatic.  Mouth/Throat: Oropharynx is clear and moist.  Eyes: EOM are normal. Pupils are equal, round, and reactive to light.  Neck: Normal range of motion.  Cardiovascular: Normal rate, regular rhythm and normal heart sounds.  Pulmonary/Chest: Effort normal and breath sounds normal.  Abdominal: Soft. Bowel sounds are normal.  Musculoskeletal: Normal range of motion. She exhibits no edema, tenderness or deformity.  Lymphadenopathy:    She has no cervical adenopathy.  Neurological: She is alert and oriented to person, place, and time.  Skin: Skin is warm and dry. No rash noted. No erythema.  Psychiatric: She has a normal mood and affect. Her behavior is normal. Judgment and thought content normal.  Vitals reviewed.    Lab Results  Component Value Date   WBC 4.3 12/25/2016   HGB 9.9 (L) 12/25/2016   HCT 31.3 (L) 12/25/2016   MCV 88 12/25/2016   PLT 296 12/25/2016     Chemistry      Component Value Date/Time   NA 142 12/04/2016 1204   NA 140 06/21/2016 0918   K 3.5 12/04/2016 1204   K 4.3 06/21/2016 0918   CL 106 12/04/2016 1204   CO2 27 12/04/2016 1204   CO2 20 (L) 06/21/2016  0918   BUN 18 12/04/2016 1204   BUN 15.8 06/21/2016 0918   CREATININE 1.2 12/04/2016 1204   CREATININE 0.8 06/21/2016 0918      Component Value Date/Time   CALCIUM 9.3 12/04/2016 1204   CALCIUM 9.4 06/21/2016 0918   ALKPHOS 40 12/04/2016 1204   ALKPHOS 66 06/21/2016 0918   AST 15 12/04/2016 1204   AST 17 06/21/2016 0918   ALT 16 12/04/2016 1204   ALT 37 06/21/2016 0918   BILITOT 0.70 12/04/2016 1204   BILITOT 0.32 06/21/2016 0918         Impression and Plan: Ms. Leppek is 66 year old female. She has recurrent lambda light chain myeloma. She also had recurrent endometrial cancer. The recurrence was localized. She is being followed by OB/GYN and radiation oncology for this.   We will have  to see what her light chain studies are.  Hopefully, we will be able to move her treatments out to every 3 weeks.  We will treat her today.  I will see her back in 2 weeks.  After that, we will get her through the holidays and have her come back in January.      [name] Kitchen Josph Macho, MD 11/26/201811:52 AM

## 2016-12-26 LAB — KAPPA/LAMBDA LIGHT CHAINS
Ig Kappa Free Light Chain: 12.7 mg/L (ref 3.3–19.4)
Ig Lambda Free Light Chain: 24.7 mg/L (ref 5.7–26.3)
Kappa/Lambda FluidC Ratio: 0.51 (ref 0.26–1.65)

## 2016-12-26 LAB — IGG, IGA, IGM
IgA, Qn, Serum: 97 mg/dL (ref 87–352)
IgG, Qn, Serum: 638 mg/dL — ABNORMAL LOW (ref 700–1600)
IgM, Qn, Serum: 31 mg/dL (ref 26–217)

## 2016-12-27 LAB — PROTEIN ELECTROPHORESIS, SERUM, WITH REFLEX
A/G Ratio: 1.2 (ref 0.7–1.7)
Albumin: 3.6 g/dL (ref 2.9–4.4)
Alpha 1: 0.2 g/dL (ref 0.0–0.4)
Alpha 2: 0.9 g/dL (ref 0.4–1.0)
Beta: 1.2 g/dL (ref 0.7–1.3)
Gamma Globulin: 0.6 g/dL (ref 0.4–1.8)
Globulin, Total: 2.9 g/dL (ref 2.2–3.9)
Total Protein: 6.5 g/dL (ref 6.0–8.5)

## 2017-01-06 ENCOUNTER — Other Ambulatory Visit: Payer: Self-pay | Admitting: Nurse Practitioner

## 2017-01-08 ENCOUNTER — Ambulatory Visit: Payer: BC Managed Care – PPO | Admitting: Hematology & Oncology

## 2017-01-08 ENCOUNTER — Other Ambulatory Visit: Payer: BC Managed Care – PPO

## 2017-01-08 ENCOUNTER — Ambulatory Visit: Payer: BC Managed Care – PPO

## 2017-01-10 ENCOUNTER — Ambulatory Visit (HOSPITAL_BASED_OUTPATIENT_CLINIC_OR_DEPARTMENT_OTHER): Payer: BC Managed Care – PPO

## 2017-01-10 ENCOUNTER — Ambulatory Visit (HOSPITAL_BASED_OUTPATIENT_CLINIC_OR_DEPARTMENT_OTHER): Payer: BC Managed Care – PPO | Admitting: Hematology & Oncology

## 2017-01-10 ENCOUNTER — Other Ambulatory Visit (HOSPITAL_BASED_OUTPATIENT_CLINIC_OR_DEPARTMENT_OTHER): Payer: BC Managed Care – PPO

## 2017-01-10 ENCOUNTER — Ambulatory Visit: Payer: BC Managed Care – PPO

## 2017-01-10 ENCOUNTER — Ambulatory Visit: Payer: BC Managed Care – PPO | Admitting: Hematology & Oncology

## 2017-01-10 ENCOUNTER — Other Ambulatory Visit: Payer: Self-pay | Admitting: Hematology & Oncology

## 2017-01-10 ENCOUNTER — Other Ambulatory Visit: Payer: Self-pay

## 2017-01-10 VITALS — BP 114/73 | HR 58 | Temp 97.7°F | Resp 20

## 2017-01-10 DIAGNOSIS — Z5112 Encounter for antineoplastic immunotherapy: Secondary | ICD-10-CM | POA: Diagnosis not present

## 2017-01-10 DIAGNOSIS — C9001 Multiple myeloma in remission: Secondary | ICD-10-CM | POA: Diagnosis not present

## 2017-01-10 DIAGNOSIS — Z79811 Long term (current) use of aromatase inhibitors: Secondary | ICD-10-CM

## 2017-01-10 DIAGNOSIS — C9000 Multiple myeloma not having achieved remission: Secondary | ICD-10-CM

## 2017-01-10 DIAGNOSIS — C541 Malignant neoplasm of endometrium: Secondary | ICD-10-CM

## 2017-01-10 LAB — CBC WITH DIFFERENTIAL (CANCER CENTER ONLY)
BASO#: 0.1 10*3/uL (ref 0.0–0.2)
BASO%: 2.5 % — ABNORMAL HIGH (ref 0.0–2.0)
EOS%: 2.5 % (ref 0.0–7.0)
Eosinophils Absolute: 0.1 10*3/uL (ref 0.0–0.5)
HCT: 30.6 % — ABNORMAL LOW (ref 34.8–46.6)
HGB: 9.8 g/dL — ABNORMAL LOW (ref 11.6–15.9)
LYMPH#: 0.8 10*3/uL — ABNORMAL LOW (ref 0.9–3.3)
LYMPH%: 20.9 % (ref 14.0–48.0)
MCH: 28.1 pg (ref 26.0–34.0)
MCHC: 32 g/dL (ref 32.0–36.0)
MCV: 88 fL (ref 81–101)
MONO#: 0.5 10*3/uL (ref 0.1–0.9)
MONO%: 12.6 % (ref 0.0–13.0)
NEUT#: 2.2 10*3/uL (ref 1.5–6.5)
NEUT%: 61.5 % (ref 39.6–80.0)
Platelets: 287 10*3/uL (ref 145–400)
RBC: 3.49 10*6/uL — ABNORMAL LOW (ref 3.70–5.32)
RDW: 17 % — ABNORMAL HIGH (ref 11.1–15.7)
WBC: 3.6 10*3/uL — ABNORMAL LOW (ref 3.9–10.0)

## 2017-01-10 LAB — CMP (CANCER CENTER ONLY)
ALT(SGPT): 19 U/L (ref 10–47)
AST: 16 U/L (ref 11–38)
Albumin: 3.8 g/dL (ref 3.3–5.5)
Alkaline Phosphatase: 40 U/L (ref 26–84)
BUN, Bld: 15 mg/dL (ref 7–22)
CO2: 27 mEq/L (ref 18–33)
Calcium: 9.5 mg/dL (ref 8.0–10.3)
Chloride: 106 mEq/L (ref 98–108)
Creat: 1 mg/dl (ref 0.6–1.2)
Glucose, Bld: 89 mg/dL (ref 73–118)
Potassium: 4 mEq/L (ref 3.3–4.7)
Sodium: 141 mEq/L (ref 128–145)
Total Bilirubin: 0.6 mg/dl (ref 0.20–1.60)
Total Protein: 6.2 g/dL — ABNORMAL LOW (ref 6.4–8.1)

## 2017-01-10 MED ORDER — SODIUM CHLORIDE 0.9 % IV SOLN: Freq: Once | INTRAVENOUS | Status: AC

## 2017-01-10 MED ORDER — SODIUM CHLORIDE 0.9% FLUSH
10.0000 mL | INTRAVENOUS | Status: DC | PRN
  Administered 2017-01-10: 10 mL
  Filled 2017-01-10: qty 10

## 2017-01-10 MED ORDER — SODIUM CHLORIDE 0.9 % IV SOLN
Freq: Once | INTRAVENOUS | Status: AC
  Administered 2017-01-10: 14:00:00 via INTRAVENOUS
  Filled 2017-01-10: qty 8

## 2017-01-10 MED ORDER — HEPARIN SOD (PORK) LOCK FLUSH 100 UNIT/ML IV SOLN
500.0000 [IU] | Freq: Once | INTRAVENOUS | Status: AC | PRN
  Administered 2017-01-10: 500 [IU]
  Filled 2017-01-10: qty 5

## 2017-01-10 MED ORDER — SODIUM CHLORIDE 0.9 % IV SOLN
Freq: Once | INTRAVENOUS | Status: AC
  Administered 2017-01-10: 14:00:00 via INTRAVENOUS

## 2017-01-10 MED ORDER — DEXTROSE 5 % IV SOLN
56.0000 mg/m2 | Freq: Once | INTRAVENOUS | Status: AC
  Administered 2017-01-10: 112 mg via INTRAVENOUS
  Filled 2017-01-10: qty 56

## 2017-01-10 NOTE — Patient Instructions (Signed)
Implanted Port Insertion, Care After °This sheet gives you information about how to care for yourself after your procedure. Your health care provider may also give you more specific instructions. If you have problems or questions, contact your health care provider. °What can I expect after the procedure? °After your procedure, it is common to have: °· Discomfort at the port insertion site. °· Bruising on the skin over the port. This should improve over 3-4 days. ° °Follow these instructions at home: °Port care °· After your port is placed, you will get a manufacturer's information card. The card has information about your port. Keep this card with you at all times. °· Take care of the port as told by your health care provider. Ask your health care provider if you or a family member can get training for taking care of the port at home. A home health care nurse may also take care of the port. °· Make sure to remember what type of port you have. °Incision care °· Follow instructions from your health care provider about how to take care of your port insertion site. Make sure you: °? Wash your hands with soap and water before you change your bandage (dressing). If soap and water are not available, use hand sanitizer. °? Change your dressing as told by your health care provider. °? Leave stitches (sutures), skin glue, or adhesive strips in place. These skin closures may need to stay in place for 2 weeks or longer. If adhesive strip edges start to loosen and curl up, you may trim the loose edges. Do not remove adhesive strips completely unless your health care provider tells you to do that. °· Check your port insertion site every day for signs of infection. Check for: °? More redness, swelling, or pain. °? More fluid or blood. °? Warmth. °? Pus or a bad smell. °General instructions °· Do not take baths, swim, or use a hot tub until your health care provider approves. °· Do not lift anything that is heavier than 10 lb (4.5  kg) for a week, or as told by your health care provider. °· Ask your health care provider when it is okay to: °? Return to work or school. °? Resume usual physical activities or sports. °· Do not drive for 24 hours if you were given a medicine to help you relax (sedative). °· Take over-the-counter and prescription medicines only as told by your health care provider. °· Wear a medical alert bracelet in case of an emergency. This will tell any health care providers that you have a port. °· Keep all follow-up visits as told by your health care provider. This is important. °Contact a health care provider if: °· You cannot flush your port with saline as directed, or you cannot draw blood from the port. °· You have a fever or chills. °· You have more redness, swelling, or pain around your port insertion site. °· You have more fluid or blood coming from your port insertion site. °· Your port insertion site feels warm to the touch. °· You have pus or a bad smell coming from the port insertion site. °Get help right away if: °· You have chest pain or shortness of breath. °· You have bleeding from your port that you cannot control. °Summary °· Take care of the port as told by your health care provider. °· Change your dressing as told by your health care provider. °· Keep all follow-up visits as told by your health care provider. °  This information is not intended to replace advice given to you by your health care provider. Make sure you discuss any questions you have with your health care provider. °Document Released: 11/06/2012 Document Revised: 12/08/2015 Document Reviewed: 12/08/2015 °Elsevier Interactive Patient Education © 2017 Elsevier Inc[employer] ° °

## 2017-01-10 NOTE — Patient Instructions (Signed)
Carfilzomib injection What is this medicine? CARFILZOMIB (kar FILZ oh mib) targets a specific protein within cancer cells and stops the cancer cells from growing. It is used to treat multiple myeloma. This medicine may be used for other purposes; ask your health care provider or pharmacist if you have questions. COMMON BRAND NAME(S): KYPROLIS What should I tell my health care provider before I take this medicine? They need to know if you have any of these conditions: -heart disease -history of blood clots -irregular heartbeat -kidney disease -liver disease -lung or breathing disease -an unusual or allergic reaction to carfilzomib, or other medicines, foods, dyes, or preservatives -pregnant or trying to get pregnant -breast-feeding How should I use this medicine? This medicine is for injection or infusion into a vein. It is given by a health care professional in a hospital or clinic setting. Talk to your pediatrician regarding the use of this medicine in children. Special care may be needed. Overdosage: If you think you have taken too much of this medicine contact a poison control center or emergency room at once. NOTE: This medicine is only for you. Do not share this medicine with others. What if I miss a dose? It is important not to miss your dose. Call your doctor or health care professional if you are unable to keep an appointment. What may interact with this medicine? Interactions are not expected. Give your health care provider a list of all the medicines, herbs, non-prescription drugs, or dietary supplements you use. Also tell them if you smoke, drink alcohol, or use illegal drugs. Some items may interact with your medicine. This list may not describe all possible interactions. Give your health care provider a list of all the medicines, herbs, non-prescription drugs, or dietary supplements you use. Also tell them if you smoke, drink alcohol, or use illegal drugs. Some items may  interact with your medicine. What should I watch for while using this medicine? Your condition will be monitored carefully while you are receiving this medicine. Report any side effects. Continue your course of treatment even though you feel ill unless your doctor tells you to stop. You may need blood work done while you are taking this medicine. Do not become pregnant while taking this medicine or for at least 30 days after stopping it. Women should inform their doctor if they wish to become pregnant or think they might be pregnant. There is a potential for serious side effects to an unborn child. Men should not father a child while taking this medicine and for 90 days after stopping it. Talk to your health care professional or pharmacist for more information. Do not breast-feed an infant while taking this medicine. Check with your doctor or health care professional if you get an attack of severe diarrhea, nausea and vomiting, or if you sweat a lot. The loss of too much body fluid can make it dangerous for you to take this medicine. You may get dizzy. Do not drive, use machinery, or do anything that needs mental alertness until you know how this medicine affects you. Do not stand or sit up quickly, especially if you are an older patient. This reduces the risk of dizzy or fainting spells. What side effects may I notice from receiving this medicine? Side effects that you should report to your doctor or health care professional as soon as possible: -allergic reactions like skin rash, itching or hives, swelling of the face, lips, or tongue -confusion -dizziness -feeling faint or lightheaded -fever or chills -  palpitations -seizures -signs and symptoms of bleeding such as bloody or black, tarry stools; red or dark-brown urine; spitting up blood or brown material that looks like coffee grounds; red spots on the skin; unusual bruising or bleeding including from the eye, gums, or nose -signs and symptoms of  a blood clot such as breathing problems; changes in vision; chest pain; severe, sudden headache; pain, swelling, warmth in the leg; trouble speaking; sudden numbness or weakness of the face, arm or leg -signs and symptoms of kidney injury like trouble passing urine or change in the amount of urine -signs and symptoms of liver injury like dark yellow or brown urine; general ill feeling or flu-like symptoms; light-colored stools; loss of appetite; nausea; right upper belly pain; unusually weak or tired; yellowing of the eyes or skin Side effects that usually do not require medical attention (report to your doctor or health care professional if they continue or are bothersome): -back pain -cough -diarrhea -headache -muscle cramps -vomiting This list may not describe all possible side effects. Call your doctor for medical advice about side effects. You may report side effects to FDA at 1-800-FDA-1088. Where should I keep my medicine? This drug is given in a hospital or clinic and will not be stored at home. NOTE: This sheet is a summary. It may not cover all possible information. If you have questions about this medicine, talk to your doctor, pharmacist, or health care provider.  2018 Elsevier/Gold Standard (2015-02-18 13:39:23)  

## 2017-01-10 NOTE — Progress Notes (Signed)
Hematology and Oncology Follow Up Visit  Emily Greer [id number] [birth year] 66 y.o. 01/10/2017   Principle Diagnosis:   Recurrent lambda light chain myeloma  History of recurrent endometrial carcinoma  Current Therapy:    Status post second autologous stem cell transplant on 07/24/2014  Maintenance therapy with Pomalidomide/every 2 week Velcade - d/c'ed  Xgeva 120 mg subcutaneous every 3 next dose in October 2018  Radiation therapy for endometrial recurrence - completed 04/20/2015  Femara 2.5mg  po q day       Pomalyst/Kyprolis 70mg /m2 IV q 2 weeks -start on         12/04/2016       Interim History:  [name] is back for followup.  She is doing okay.  It is her husband who is having problems.  It sounds like he is having some lower back issues.  He is going to be seeing a neurosurgeon hopefully this or next week.  There is been no problems with nausea or vomiting.  She has had no cough or shortness of breath.  She has had no change in bowel or bladder habits.  Her last lambda light chain was 2.5 mg/dL.  She has had no rashes.  There is been no leg swelling.   Overall, her performance status is ECOG 1.  .  Medications: Allergies:  Allergies  Allergen Reactions  . Codeine Nausea Only    Past Medical History, Surgical history, Social history, and Family History were reviewed and updated.  Review of Systems: As stated in the interim history  Physical Exam:  oral temperature is 97.7 F (36.5 C). Her blood pressure is 114/73 and her pulse is 58 (abnormal). Her respiration is 20 and oxygen saturation is 100%.   Physical Exam  Constitutional: She is oriented to person, place, and time.  HENT:  Head: Normocephalic and atraumatic.  Mouth/Throat: Oropharynx is clear and moist.  Eyes: EOM are normal. Pupils are equal, round, and reactive to light.  Neck: Normal range of motion.  Cardiovascular: Normal rate, regular rhythm and normal heart sounds.    Pulmonary/Chest: Effort normal and breath sounds normal.  Abdominal: Soft. Bowel sounds are normal.  Musculoskeletal: Normal range of motion. She exhibits no edema, tenderness or deformity.  Lymphadenopathy:    She has no cervical adenopathy.  Neurological: She is alert and oriented to person, place, and time.  Skin: Skin is warm and dry. No rash noted. No erythema.  Psychiatric: She has a normal mood and affect. Her behavior is normal. Judgment and thought content normal.  Vitals reviewed.    Lab Results  Component Value Date   WBC 3.6 (L) 01/10/2017   HGB 9.8 (L) 01/10/2017   HCT 30.6 (L) 01/10/2017   MCV 88 01/10/2017   PLT 287 01/10/2017     Chemistry      Component Value Date/Time   NA 141 01/10/2017 1115   NA 140 06/21/2016 0918   K 4.0 01/10/2017 1115   K 4.3 06/21/2016 0918   CL 106 01/10/2017 1115   CO2 27 01/10/2017 1115   CO2 20 (L) 06/21/2016 0918   BUN 15 01/10/2017 1115   BUN 15.8 06/21/2016 0918   CREATININE 1.0 01/10/2017 1115   CREATININE 0.8 06/21/2016 0918      Component Value Date/Time   CALCIUM 9.5 01/10/2017 1115   CALCIUM 9.4 06/21/2016 0918   ALKPHOS 40 01/10/2017 1115   ALKPHOS 66 06/21/2016 0918   AST 16 01/10/2017 1115   AST 17 06/21/2016 4098  ALT 19 01/10/2017 1115   ALT 37 06/21/2016 0918   BILITOT 0.60 01/10/2017 1115   BILITOT 0.32 06/21/2016 0918         Impression and Plan: Emily Greer is 66 year old female. She has recurrent lambda light chain myeloma. She also had recurrent endometrial cancer. The recurrence was localized. She is being followed by OB/GYN and radiation oncology for this.   Hopefully, we will see her light chains improve.  I would have to believe that the will improve.  We will see her back in another 3 or 4 weeks.  I would like to give her the week off between the Christmas and New Year's.  She will be with her family.  It sounds like her husband is going to be having surgery soon.  I want to make sure  that Emily Greer is able to help him.  [name] Kitchen Emily Macho, MD 12/12/201812:50 PM

## 2017-01-10 NOTE — Addendum Note (Signed)
Addended by: Burney Gauze R on: 01/10/2017 01:27 PM   Modules accepted: Orders

## 2017-01-11 LAB — ERYTHROPOIETIN: Erythropoietin: 30.9 m[IU]/mL — ABNORMAL HIGH (ref 2.6–18.5)

## 2017-01-11 LAB — IGG, IGA, IGM
IgA, Qn, Serum: 37 mg/dL — ABNORMAL LOW (ref 87–352)
IgG, Qn, Serum: 527 mg/dL — ABNORMAL LOW (ref 700–1600)
IgM, Qn, Serum: 19 mg/dL — ABNORMAL LOW (ref 26–217)

## 2017-01-11 LAB — KAPPA/LAMBDA LIGHT CHAINS
Ig Kappa Free Light Chain: 3.8 mg/L (ref 3.3–19.4)
Ig Lambda Free Light Chain: 16.5 mg/L (ref 5.7–26.3)
Kappa/Lambda FluidC Ratio: 0.23 — ABNORMAL LOW (ref 0.26–1.65)

## 2017-01-12 ENCOUNTER — Telehealth: Payer: Self-pay | Admitting: *Deleted

## 2017-01-12 LAB — PROTEIN ELECTROPHORESIS, SERUM, WITH REFLEX
A/G Ratio: 1.6 (ref 0.7–1.7)
Albumin: 3.6 g/dL (ref 2.9–4.4)
Alpha 1: 0.2 g/dL (ref 0.0–0.4)
Alpha 2: 0.7 g/dL (ref 0.4–1.0)
Beta: 1 g/dL (ref 0.7–1.3)
Gamma Globulin: 0.5 g/dL (ref 0.4–1.8)
Globulin, Total: 2.3 g/dL (ref 2.2–3.9)
Total Protein: 5.9 g/dL — ABNORMAL LOW (ref 6.0–8.5)

## 2017-01-12 NOTE — Telephone Encounter (Addendum)
Patient is aware of results  ----- Message from [name], MD sent at 01/11/2017  4:54 PM EST ----- Call - myeloma level is decreasing nicely!!!  pete

## 2017-01-15 ENCOUNTER — Other Ambulatory Visit: Payer: BC Managed Care – PPO

## 2017-01-15 ENCOUNTER — Ambulatory Visit: Payer: BC Managed Care – PPO | Admitting: Family

## 2017-01-15 ENCOUNTER — Ambulatory Visit: Payer: BC Managed Care – PPO

## 2017-01-18 ENCOUNTER — Encounter: Payer: Self-pay | Admitting: Gynecologic Oncology

## 2017-01-18 ENCOUNTER — Ambulatory Visit: Payer: BC Managed Care – PPO | Attending: Gynecologic Oncology | Admitting: Gynecologic Oncology

## 2017-01-18 VITALS — BP 141/83 | HR 102 | Temp 98.7°F | Resp 20 | Ht 64.0 in | Wt 186.7 lb

## 2017-01-18 DIAGNOSIS — I10 Essential (primary) hypertension: Secondary | ICD-10-CM | POA: Diagnosis not present

## 2017-01-18 DIAGNOSIS — Z9079 Acquired absence of other genital organ(s): Secondary | ICD-10-CM | POA: Insufficient documentation

## 2017-01-18 DIAGNOSIS — K625 Hemorrhage of anus and rectum: Secondary | ICD-10-CM

## 2017-01-18 DIAGNOSIS — Z90710 Acquired absence of both cervix and uterus: Secondary | ICD-10-CM | POA: Insufficient documentation

## 2017-01-18 DIAGNOSIS — Z923 Personal history of irradiation: Secondary | ICD-10-CM | POA: Diagnosis not present

## 2017-01-18 DIAGNOSIS — N890 Mild vaginal dysplasia: Secondary | ICD-10-CM

## 2017-01-18 DIAGNOSIS — K219 Gastro-esophageal reflux disease without esophagitis: Secondary | ICD-10-CM | POA: Insufficient documentation

## 2017-01-18 DIAGNOSIS — Z90722 Acquired absence of ovaries, bilateral: Secondary | ICD-10-CM | POA: Diagnosis not present

## 2017-01-18 DIAGNOSIS — Z9484 Stem cells transplant status: Secondary | ICD-10-CM | POA: Diagnosis not present

## 2017-01-18 DIAGNOSIS — R87619 Unspecified abnormal cytological findings in specimens from cervix uteri: Secondary | ICD-10-CM

## 2017-01-18 DIAGNOSIS — C541 Malignant neoplasm of endometrium: Secondary | ICD-10-CM | POA: Diagnosis present

## 2017-01-18 DIAGNOSIS — M810 Age-related osteoporosis without current pathological fracture: Secondary | ICD-10-CM | POA: Insufficient documentation

## 2017-01-18 DIAGNOSIS — E041 Nontoxic single thyroid nodule: Secondary | ICD-10-CM | POA: Insufficient documentation

## 2017-01-18 DIAGNOSIS — Z79811 Long term (current) use of aromatase inhibitors: Secondary | ICD-10-CM

## 2017-01-18 NOTE — Progress Notes (Signed)
Office Visit:  GYN ONCOLOGY  CC:   Recurrent endometrial cancer , VAIN I   Assessment:   66 y.o.  year old with Stage1A Grade 2 endometrioid endometrial cancer staged 10/2011 with recurrence at the distal vagina identified in April 2014.  Subsequent PET scan was negative for evidence of metastatic disease and Emily Games WoodwardCompleted vaginal  brachytherapy 07/29/2012.  Second vaginal recurrence (apex) diagnosed December 2016. Imaging without evidence of  metastatic disease.    Options discussed at multidisciplinary tumor conference and she was treated with conformational radiotherapy   03/10/2015 - 04/20/2015 Continue Femara  Abnormal Pap Follow-up with Dr. Leo Grosser in June 2019 at that time a Pap test is recommended. Follow-up with GYN oncology December 2019 Advised to moisturize the  vagina with coconut oil  Rectal bleeding Advised to follow-up with colonoscopy as scheduled  IHCC positive testing Declined genetic counseling Consider MSI testing of tumor for consideration of  PDL1 checkpoint inhibitor if there is recurrence   HPI:  Emily Greer is a 66 y.o. year old G3P2 initially seen in consultation on 10/05/2011  grade 1  endometrial cancer  She then underwent a  total abdominal hysterectomy bilateral salpingo-oophorectomy bilateral pelvic lymph node dissection on  63/87/5643 without complications.  Her postoperative course was  uncomplicated.  Her final pathologic diagnosis is a Stage  1A Grade  2 endometrioid endometrial cancer with  negative lymphovascular space invasion,  2/20 (10%) of myometrial invasion and negative lymph nodes.  On 01/2012 visit she reported  post coital vaginal bleeding  that was self limiting. Vaginal biopsy - LARGELY DENUDED SQUAMOUS EPITHELIUM WITH ASSOCIATED SPONGIOSIS AND CHRONIC INFLAMMATION.- NO DYSPLASIA OR MALIGNANCY  On the visit 04/2012 she c/o vaginal bleeding.  Biopsies were c/w metastatic disease at the distal vagina  PET 06/05/2012 IMPRESSION:   1. There are no specific features identified to suggest  hypermetabolic metastasis from endometrial carcinoma.  2. Skeletal changes of multiple myeloma with focal area of increased uptake   Treated with vaginal brachytherapy June 4, June 11, June 19, June 25, June 30/2014 Site/dose: Vagina, 30.5 Gy in 5 fractions (6 Gy, 6 Gy, 6 Gy, 6 Gy, 6.5 Gy)  Pap 08/17/2013 ASCUS HPV neg  She was seen by Dr Leo Grosser on 01/21/15 at which time a friable lesion was noted at the vaginal cuff. This was biopsied and was consistent with endometrioid adenocarcinoma (recurrence).  CT C/A/P 02/15/2015 without evidence of metastatic disease.  Care complicated by successful transplant 2016 for Rx multiple myeloma. Received conformational radiotherapy completed 03/2015 and then started on femara  CT C/A/P 07/26/2015 - PET declined by insurance- 1. Stable examinations demonstrating no evidence of metastatic endometrial carcinoma. 2. Stable widespread osseous findings attributed to multiple myeloma. No evidence of pathologic fracture or epidural tumor. 3. Grossly stable small left thyroid nodule. 4. Chronic bilateral femoral head avascular necrosis.  Interval history: Pap collected by Dr. Leo Grosser in October 2017 ASCUS positive for high risk HPV virus.   Colposcopy 12/30/2015  c/w  Vagina, biopsy, vaginal apex - LOW GRADE SQUAMOUS INTRAEPITHELIAL LESION, CIN-I (MILD DYSPLASIA). - NO MALIGNANCY IDENTIFIED.  Pap 10/2015 ASCUS HPV + Colposcopy with bx vaginal apex 12/2015 c/w VAIN I S/P Co2 laser of the vagina 02/22/2016 Pap 06/2016 ASCUS HPV + Colposcopy with bx 07/2016 c/w  VAIN 1   Interval history: Reports spotting with intercourse and reports occasional rectal bleeding Past Medical History:  Diagnosis Date  . Avascular necrosis of femoral head ([place])    bilateral per CT 07-26-2015  .  Endometrial carcinoma [facility]) gyn oncologist-  dr Skeet Latch ([facility])/  radiation oncologist-- dr Sondra Come   2013 dx  FIGO  Stage 1A, Grade 2 endometrioid endometrial cancer s/p TAH w/ BSO and bilateral pelvic node dissection 10-31-2011 ;  recurrence at distal vagina 04/ 2014 s/p  brachytherapy (ended 07-29-2012);  2nd recurrence 12/ 2016  vaginal apex s/p  conformational radiotherapy 03-10-2015 to 04-20-2015  . GERD (gastroesophageal reflux disease)   . H/O stem cell transplant ([place])    02/ 2000 and second one 06/ 2016  . 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 History of radiation therapy 6/4, 6/11, 6/19, 6/25, 07/29/2012   vagina 30.5 gray in 5 fx, HDR brachytherapy:   last radiation to vagina 03-10-2015 to 04-20-2015  50.4gray  . History of radiation therapy 03/10/15-04/20/15   vagina 50.4 in 28 fractions  . Hypertension   . Lambda light chain myeloma [facility]) oncologist-  dr Marin Olp ([facility])  and  [facility] -- dr Salena Saner gasparetto   dx 07/ 1999 s/p  VAD chemotherapy 11/ 1999,  purged autotransplant 03-21-1998 followed by auto stem cell transplant 03-29-1998;  recurrance w/ second autologous stem cell transplant 07-24-2014;  in Re-mission currently , chemo maintenance therapy  . Osteoporosis 12/18/05   Increased  risk   . PONV (postoperative nausea and vomiting)   . Wears glasses      Past Surgical History:  Procedure Laterality Date  . ABDOMINAL HYSTERECTOMY  10/31/2011   Procedure: HYSTERECTOMY ABDOMINAL;  Surgeon: [name], MD PHD;  Location: [facility];  Service: Gynecology;  Laterality: N/A;  . CO2 LASER APPLICATION N/A 3/81/0175   Procedure: CO2 LASER APPLICATION;  Surgeon: [name], MD;  Location: [facility];  Service: Gynecology;  Laterality: N/A;  . ECTOPIC PREGNANCY SURGERY  1992  . HYSTEROSCOPY W/D&C  09/27/2011   Procedure: DILATATION AND CURETTAGE /HYSTEROSCOPY;  Surgeon: Eldred Manges, MD;  Location: [facility];  Service: Gynecology;;  . IR FLUORO GUIDE PORT INSERTION RIGHT  09/08/2016  . IR US GUIDE VASC ACCESS RIGHT   09/08/2016  . LAPAROTOMY  10/31/2011   Procedure: EXPLORATORY LAPAROTOMY;  Surgeon: [name], MD PHD;  Location: [facility];  Service: Gynecology;  Laterality: N/A;  . SALPINGOOPHORECTOMY  10/31/2011   Procedure: SALPINGO OOPHERECTOMY;  Surgeon: [name], MD PHD;  Location: [facility];  Service: Gynecology;  Laterality: Bilateral;   Lymph Nodes sampling  . 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  They just purchased and moved to a larger home to accomodte their parents and visiting children grandchildren.  Husband has a herniated disc Review of systems: Constitutional:  She has no fever or chills. No changes in weight.  Cardiovascular: No chest pain, palpitations or edema. Respiratory:  No shortness of breath, wheezing or cough Gastrointestinal: She has normal bowel movements without diarrhea or constipation. She denies any nausea or vomiting.  Reports intermittent rectal bleeding Genitourinary:  She denies pelvic pain, pelvic pressure or changes in her urinary function. + vaginal bleeding (light) with intercourse - unchanged Otherwise uninformative 10 point review of system   Physical Exam: Blood pressure (!) 141/83, pulse (!) 102, temperature 98.7 F (37.1 C), temperature source Oral, resp. rate 20, height '5\' 4"'$  (1.626 m), weight 186 lb 11.2 oz (84.7 kg), SpO2 100 %. Wt Readings from Last 3 Encounters:  01/18/17 186 lb 11.2  oz (84.7 kg)  12/25/16 190 lb (86.2 kg)  11/08/16 191 lb (86.6 kg)   General: Well dressed, well nourished in no apparent distress.   Abdomen:  Soft, nontender, nondistended.  No palpable masses.  No hepatosplenomegaly.  No ascites.   No hernias.   Genitourinary: Normal EGBUS radiation changes at the vaginal apex.  Vagina foreshortened and narrowed.     Radiation changes present .  No masses no bleeding Chest:  CTA LN:  No cervical supraclavicular or inguinal  adenopathy Rectal: Good tone no masses  back: No CVA tenderness

## 2017-01-18 NOTE — Patient Instructions (Signed)
Plan to follow up with Dr. Leo Grosser in six months and Dr. Skeet Latch in one year.  Please call after you see Dr. Leo Grosser to schedule with Dr. Skeet Latch.

## 2017-01-25 ENCOUNTER — Other Ambulatory Visit: Payer: Self-pay | Admitting: *Deleted

## 2017-01-25 MED ORDER — POMALIDOMIDE 1 MG PO CAPS: ORAL_CAPSULE | ORAL | 0 refills | Status: DC

## 2017-01-29 ENCOUNTER — Other Ambulatory Visit: Payer: BC Managed Care – PPO

## 2017-01-29 ENCOUNTER — Ambulatory Visit: Payer: BC Managed Care – PPO | Admitting: Hematology & Oncology

## 2017-01-29 ENCOUNTER — Ambulatory Visit: Payer: BC Managed Care – PPO

## 2017-02-05 ENCOUNTER — Encounter: Payer: Self-pay | Admitting: Hematology & Oncology

## 2017-02-05 ENCOUNTER — Inpatient Hospital Stay (HOSPITAL_BASED_OUTPATIENT_CLINIC_OR_DEPARTMENT_OTHER): Payer: BC Managed Care – PPO | Admitting: Hematology & Oncology

## 2017-02-05 ENCOUNTER — Inpatient Hospital Stay: Payer: BC Managed Care – PPO

## 2017-02-05 ENCOUNTER — Other Ambulatory Visit: Payer: Self-pay

## 2017-02-05 ENCOUNTER — Inpatient Hospital Stay: Payer: BC Managed Care – PPO | Attending: Hematology & Oncology

## 2017-02-05 DIAGNOSIS — Z923 Personal history of irradiation: Secondary | ICD-10-CM | POA: Insufficient documentation

## 2017-02-05 DIAGNOSIS — C9000 Multiple myeloma not having achieved remission: Secondary | ICD-10-CM | POA: Diagnosis not present

## 2017-02-05 DIAGNOSIS — C9001 Multiple myeloma in remission: Secondary | ICD-10-CM

## 2017-02-05 DIAGNOSIS — Z79811 Long term (current) use of aromatase inhibitors: Secondary | ICD-10-CM

## 2017-02-05 DIAGNOSIS — C541 Malignant neoplasm of endometrium: Secondary | ICD-10-CM | POA: Insufficient documentation

## 2017-02-05 DIAGNOSIS — D500 Iron deficiency anemia secondary to blood loss (chronic): Secondary | ICD-10-CM

## 2017-02-05 LAB — CMP (CANCER CENTER ONLY)
ALT: 23 U/L (ref 0–55)
AST: 18 U/L (ref 5–34)
Albumin: 4 g/dL (ref 3.5–5.0)
Alkaline Phosphatase: 51 U/L (ref 40–150)
Anion gap: 11 (ref 5–15)
BUN: 15 mg/dL (ref 7–26)
CO2: 28 mmol/L (ref 22–29)
Calcium: 9.6 mg/dL (ref 8.4–10.4)
Chloride: 103 mmol/L (ref 98–109)
Creatinine: 1.1 mg/dL (ref 0.70–1.30)
GFR, Est AFR Am: >60 mL/min (ref >60)
GFR, Estimated: 52 mL/min — ABNORMAL LOW (ref >60)
Glucose, Bld: 97 mg/dL (ref 70–140)
Potassium: 3.5 mmol/L (ref 3.3–4.7)
Sodium: 142 mmol/L (ref 136–145)
Total Bilirubin: 0.5 mg/dL (ref 0.2–1.2)
Total Protein: 6.4 g/dL (ref 6.4–8.3)

## 2017-02-05 LAB — CBC WITH DIFFERENTIAL (CANCER CENTER ONLY)
Abs Granulocyte: 1.7 10*3/uL (ref 1.5–6.5)
Basophils Absolute: 0.1 10*3/uL (ref 0.0–0.1)
Basophils Relative: 2 %
Eosinophils Absolute: 0.2 10*3/uL (ref 0.0–0.5)
Eosinophils Relative: 6 %
HCT: 33.7 % — ABNORMAL LOW (ref 34.8–46.6)
Hemoglobin: 11.1 g/dL — ABNORMAL LOW (ref 11.6–15.9)
Lymphocytes Relative: 22 %
Lymphs Abs: 0.7 10*3/uL — ABNORMAL LOW (ref 0.9–3.3)
MCH: 27.7 pg (ref 26.0–34.0)
MCHC: 32.9 g/dL (ref 32.0–36.0)
MCV: 84 fL (ref 81.0–101.0)
Monocytes Absolute: 0.5 10*3/uL (ref 0.1–0.9)
Monocytes Relative: 16 %
Neutro Abs: 1.7 10*3/uL (ref 1.5–6.5)
Neutrophils Relative %: 54 %
Platelet Count: 239 10*3/uL (ref 145–400)
RBC: 4.01 MIL/uL (ref 3.70–5.32)
RDW: 16.2 % — ABNORMAL HIGH (ref 11.1–15.7)
WBC Count: 3.2 10*3/uL — ABNORMAL LOW (ref 4.0–10.3)

## 2017-02-05 NOTE — Progress Notes (Signed)
Hematology and Oncology Follow Up Visit  Emily Greer [id number] [birth year] 67 y.o. 02/05/2017   Principle Diagnosis:   Recurrent lambda light chain myeloma  History of recurrent endometrial carcinoma  Current Therapy:    Status post second autologous stem cell transplant on 07/24/2014  Maintenance therapy with Pomalidomide/every 2 week Velcade - d/c'ed  Xgeva 120 mg subcutaneous every 3 next dose in October 2018  Radiation therapy for endometrial recurrence - completed 04/20/2015  Femara 2.5mg  po q day       Pomalyst/Kyprolis 70mg /m2 IV q 2 weeks -s/p cycle #4      Interim History:  [name] is back for followup.  She got through the holidays without any issues.  Thankfully, her family is doing okay.  She has elderly parents who live in [place].  She has been going down to see them.  Her myeloma levels have been doing fantastic.  Her last lambda light chain was 1.75 mg/dL.  Unfortunately, her new insurance is forbidding asked to give her Kyprolis.  They say that it is not FDA approved with the Pomalidomide.  I tried to tell them that she has been on this for several months.  There are numerous clinical trials which have documented a benefit.  She has had no problems with fever.  There is been no cough or shortness of breath.  She has had no nausea or vomiting.  She has had no leg swelling.  She has had no rashes.  There is been no issues with respect to her endometrial cancer.  She has had radiation for this.  She currently is on Femara for this.  She does not have to go back to see the gynecologic oncologist for another 6 months.  Overall, her performance status is ECOG 1.  .  Medications: Allergies:  Allergies  Allergen Reactions  . Codeine Nausea Only    Past Medical History, Surgical history, Social history, and Family History were reviewed and updated.  Review of Systems: As stated in the interim history  Physical Exam:  Physical Exam    Constitutional: She is oriented to person, place, and time.  HENT:  Head: Normocephalic and atraumatic.  Mouth/Throat: Oropharynx is clear and moist.  Eyes: EOM are normal. Pupils are equal, round, and reactive to light.  Neck: Normal range of motion.  Cardiovascular: Normal rate, regular rhythm and normal heart sounds.  Pulmonary/Chest: Effort normal and breath sounds normal.  Abdominal: Soft. Bowel sounds are normal.  Musculoskeletal: Normal range of motion. She exhibits no edema, tenderness or deformity.  Lymphadenopathy:    She has no cervical adenopathy.  Neurological: She is alert and oriented to person, place, and time.  Skin: Skin is warm and dry. No rash noted. No erythema.  Psychiatric: She has a normal mood and affect. Her behavior is normal. Judgment and thought content normal.  Vitals reviewed.    Lab Results  Component Value Date   WBC 3.6 (L) 01/10/2017   HGB 9.8 (L) 01/10/2017   HCT 33.7 (L) 02/05/2017   MCV 84.0 02/05/2017   PLT 287 01/10/2017     Chemistry      Component Value Date/Time   NA 142 02/05/2017 1106   NA 141 01/10/2017 1115   NA 140 06/21/2016 0918   K 3.5 02/05/2017 1106   K 4.0 01/10/2017 1115   K 4.3 06/21/2016 0918   CL 103 02/05/2017 1106   CL 106 01/10/2017 1115   CO2 28 02/05/2017 1106   CO2  27 01/10/2017 1115   CO2 20 (L) 06/21/2016 0918   BUN 15 02/05/2017 1106   BUN 15 01/10/2017 1115   BUN 15.8 06/21/2016 0918   CREATININE 1.0 01/10/2017 1115   CREATININE 0.8 06/21/2016 0918      Component Value Date/Time   CALCIUM 9.6 02/05/2017 1106   CALCIUM 9.5 01/10/2017 1115   CALCIUM 9.4 06/21/2016 0918   ALKPHOS 51 02/05/2017 1106   ALKPHOS 40 01/10/2017 1115   ALKPHOS 66 06/21/2016 0918   AST 18 02/05/2017 1106   AST 17 06/21/2016 0918   ALT 23 02/05/2017 1106   ALT 19 01/10/2017 1115   ALT 37 06/21/2016 0918   BILITOT 0.5 02/05/2017 1106   BILITOT 0.32 06/21/2016 0918         Impression and Plan: Emily Greer is  67 year old female. She has recurrent lambda light chain myeloma. She also had recurrent endometrial cancer. The recurrence was localized. She is being followed by OB/GYN and radiation oncology for this.   Hopefully, her insurance company will allow [place] to do the Kyprolis.  This is worked incredibly well.  Her light chain level has come down by almost 90%.  We will not be able to treat her today.  Hopefully we will be able to treat her later on this week.  I would like to see her back in 1 month.    Josph Macho, MD 1/7/20192:54 PM

## 2017-02-07 ENCOUNTER — Inpatient Hospital Stay: Payer: BC Managed Care – PPO

## 2017-02-07 VITALS — BP 136/74 | Temp 97.4°F | Resp 16

## 2017-02-07 DIAGNOSIS — C541 Malignant neoplasm of endometrium: Secondary | ICD-10-CM

## 2017-02-07 DIAGNOSIS — C9000 Multiple myeloma not having achieved remission: Secondary | ICD-10-CM | POA: Diagnosis not present

## 2017-02-07 DIAGNOSIS — C9001 Multiple myeloma in remission: Secondary | ICD-10-CM

## 2017-02-07 MED ORDER — DENOSUMAB 120 MG/1.7ML ~~LOC~~ SOLN
120.0000 mg | Freq: Once | SUBCUTANEOUS | Status: AC
  Administered 2017-02-07: 120 mg via SUBCUTANEOUS

## 2017-02-07 MED ORDER — SODIUM CHLORIDE 0.9 % IV SOLN
Freq: Once | INTRAVENOUS | Status: AC
  Administered 2017-02-07: 12:00:00 via INTRAVENOUS
  Filled 2017-02-07: qty 8

## 2017-02-07 MED ORDER — HEPARIN SOD (PORK) LOCK FLUSH 100 UNIT/ML IV SOLN
500.0000 [IU] | Freq: Once | INTRAVENOUS | Status: AC | PRN
  Administered 2017-02-07: 500 [IU]
  Filled 2017-02-07: qty 5

## 2017-02-07 MED ORDER — CARFILZOMIB CHEMO INJECTION 60 MG
56.0000 mg/m2 | Freq: Once | INTRAVENOUS | Status: AC
  Administered 2017-02-07: 112 mg via INTRAVENOUS
  Filled 2017-02-07: qty 56

## 2017-02-07 MED ORDER — SODIUM CHLORIDE 0.9 % IV SOLN
Freq: Once | INTRAVENOUS | Status: AC
  Administered 2017-02-07: 12:00:00 via INTRAVENOUS

## 2017-02-07 MED ORDER — DENOSUMAB 120 MG/1.7ML ~~LOC~~ SOLN
SUBCUTANEOUS | Status: AC
  Filled 2017-02-07: qty 1.7

## 2017-02-07 MED ORDER — SODIUM CHLORIDE 0.9% FLUSH
10.0000 mL | INTRAVENOUS | Status: DC | PRN
Start: 2017-02-07 — End: 2017-02-07
  Administered 2017-02-07: 10 mL
  Filled 2017-02-07: qty 10

## 2017-02-07 NOTE — Patient Instructions (Signed)
Carfilzomib injection What is this medicine? CARFILZOMIB (kar FILZ oh mib) targets a specific protein within cancer cells and stops the cancer cells from growing. It is used to treat multiple myeloma. This medicine may be used for other purposes; ask your health care provider or pharmacist if you have questions. COMMON BRAND NAME(S): KYPROLIS What should I tell my health care provider before I take this medicine? They need to know if you have any of these conditions: -heart disease -history of blood clots -irregular heartbeat -kidney disease -liver disease -lung or breathing disease -an unusual or allergic reaction to carfilzomib, or other medicines, foods, dyes, or preservatives -pregnant or trying to get pregnant -breast-feeding How should I use this medicine? This medicine is for injection or infusion into a vein. It is given by a health care professional in a hospital or clinic setting. Talk to your pediatrician regarding the use of this medicine in children. Special care may be needed. Overdosage: If you think you have taken too much of this medicine contact a poison control center or emergency room at once. NOTE: This medicine is only for you. Do not share this medicine with others. What if I miss a dose? It is important not to miss your dose. Call your doctor or health care professional if you are unable to keep an appointment. What may interact with this medicine? Interactions are not expected. Give your health care provider a list of all the medicines, herbs, non-prescription drugs, or dietary supplements you use. Also tell them if you smoke, drink alcohol, or use illegal drugs. Some items may interact with your medicine. This list may not describe all possible interactions. Give your health care provider a list of all the medicines, herbs, non-prescription drugs, or dietary supplements you use. Also tell them if you smoke, drink alcohol, or use illegal drugs. Some items may  interact with your medicine. What should I watch for while using this medicine? Your condition will be monitored carefully while you are receiving this medicine. Report any side effects. Continue your course of treatment even though you feel ill unless your doctor tells you to stop. You may need blood work done while you are taking this medicine. Do not become pregnant while taking this medicine or for at least 30 days after stopping it. Women should inform their doctor if they wish to become pregnant or think they might be pregnant. There is a potential for serious side effects to an unborn child. Men should not father a child while taking this medicine and for 90 days after stopping it. Talk to your health care professional or pharmacist for more information. Do not breast-feed an infant while taking this medicine. Check with your doctor or health care professional if you get an attack of severe diarrhea, nausea and vomiting, or if you sweat a lot. The loss of too much body fluid can make it dangerous for you to take this medicine. You may get dizzy. Do not drive, use machinery, or do anything that needs mental alertness until you know how this medicine affects you. Do not stand or sit up quickly, especially if you are an older patient. This reduces the risk of dizzy or fainting spells. What side effects may I notice from receiving this medicine? Side effects that you should report to your doctor or health care professional as soon as possible: -allergic reactions like skin rash, itching or hives, swelling of the face, lips, or tongue -confusion -dizziness -feeling faint or lightheaded -fever or chills -  palpitations -seizures -signs and symptoms of bleeding such as bloody or black, tarry stools; red or dark-brown urine; spitting up blood or brown material that looks like coffee grounds; red spots on the skin; unusual bruising or bleeding including from the eye, gums, or nose -signs and symptoms of  a blood clot such as breathing problems; changes in vision; chest pain; severe, sudden headache; pain, swelling, warmth in the leg; trouble speaking; sudden numbness or weakness of the face, arm or leg -signs and symptoms of kidney injury like trouble passing urine or change in the amount of urine -signs and symptoms of liver injury like dark yellow or brown urine; general ill feeling or flu-like symptoms; light-colored stools; loss of appetite; nausea; right upper belly pain; unusually weak or tired; yellowing of the eyes or skin Side effects that usually do not require medical attention (report to your doctor or health care professional if they continue or are bothersome): -back pain -cough -diarrhea -headache -muscle cramps -vomiting This list may not describe all possible side effects. Call your doctor for medical advice about side effects. You may report side effects to FDA at 1-800-FDA-1088. Where should I keep my medicine? This drug is given in a hospital or clinic and will not be stored at home. NOTE: This sheet is a summary. It may not cover all possible information. If you have questions about this medicine, talk to your doctor, pharmacist, or health care provider.  2018 Elsevier/Gold Standard (2015-02-18 13:39:23)  

## 2017-02-19 ENCOUNTER — Ambulatory Visit: Payer: BC Managed Care – PPO | Admitting: Hematology & Oncology

## 2017-02-19 ENCOUNTER — Ambulatory Visit: Payer: BC Managed Care – PPO

## 2017-02-19 ENCOUNTER — Other Ambulatory Visit: Payer: BC Managed Care – PPO

## 2017-02-20 ENCOUNTER — Other Ambulatory Visit: Payer: Self-pay | Admitting: *Deleted

## 2017-02-20 DIAGNOSIS — C9000 Multiple myeloma not having achieved remission: Secondary | ICD-10-CM

## 2017-02-21 ENCOUNTER — Inpatient Hospital Stay: Payer: BC Managed Care – PPO

## 2017-02-21 ENCOUNTER — Other Ambulatory Visit: Payer: Self-pay

## 2017-02-21 VITALS — BP 140/80 | HR 96 | Temp 98.7°F | Resp 20

## 2017-02-21 DIAGNOSIS — C9000 Multiple myeloma not having achieved remission: Secondary | ICD-10-CM

## 2017-02-21 LAB — CMP (CANCER CENTER ONLY)
ALT: 14 U/L (ref 0–55)
AST: 17 U/L (ref 5–34)
Albumin: 3.7 g/dL (ref 3.5–5.0)
Alkaline Phosphatase: 46 U/L (ref 26–84)
Anion gap: 10 (ref 5–15)
BUN: 15 mg/dL (ref 7–22)
CO2: 27 mmol/L (ref 18–33)
Calcium: 9.3 mg/dL (ref 8.0–10.3)
Chloride: 106 mmol/L (ref 98–108)
Creatinine: 0.7 mg/dL (ref 0.60–1.10)
Glucose, Bld: 107 mg/dL (ref 73–118)
Potassium: 3.2 mmol/L — ABNORMAL LOW (ref 3.5–5.1)
Sodium: 143 mmol/L (ref 128–145)
Total Bilirubin: 0.6 mg/dL (ref 0.2–1.2)
Total Protein: 6.7 g/dL (ref 6.4–8.1)

## 2017-02-21 LAB — CBC WITH DIFFERENTIAL (CANCER CENTER ONLY)
Basophils Absolute: 0.1 10*3/uL (ref 0.0–0.1)
Basophils Relative: 2 %
Eosinophils Absolute: 0.1 10*3/uL (ref 0.0–0.5)
Eosinophils Relative: 3 %
HCT: 33.3 % — ABNORMAL LOW (ref 34.8–46.6)
Hemoglobin: 10.8 g/dL — ABNORMAL LOW (ref 11.6–15.9)
Lymphocytes Relative: 19 %
Lymphs Abs: 1 10*3/uL (ref 0.9–3.3)
MCH: 26.9 pg (ref 26.0–34.0)
MCHC: 32.4 g/dL (ref 32.0–36.0)
MCV: 82.8 fL (ref 81.0–101.0)
Monocytes Absolute: 0.6 10*3/uL (ref 0.1–0.9)
Monocytes Relative: 11 %
Neutro Abs: 3.3 10*3/uL (ref 1.5–6.5)
Neutrophils Relative %: 65 %
Platelet Count: 255 10*3/uL (ref 145–400)
RBC: 4.02 MIL/uL (ref 3.70–5.32)
RDW: 16.6 % — ABNORMAL HIGH (ref 11.1–15.7)
WBC Count: 5.1 10*3/uL (ref 3.9–10.3)

## 2017-02-21 MED ORDER — SODIUM CHLORIDE 0.9% FLUSH
10.0000 mL | INTRAVENOUS | Status: DC | PRN
  Administered 2017-02-21: 10 mL
  Filled 2017-02-21: qty 10

## 2017-02-21 MED ORDER — CARFILZOMIB CHEMO INJECTION 60 MG
56.0000 mg/m2 | Freq: Once | INTRAVENOUS | Status: AC
  Administered 2017-02-21: 112 mg via INTRAVENOUS
  Filled 2017-02-21: qty 56

## 2017-02-21 MED ORDER — SODIUM CHLORIDE 0.9 % IV SOLN: Freq: Once | INTRAVENOUS | Status: DC

## 2017-02-21 MED ORDER — SODIUM CHLORIDE 0.9 % IV SOLN
Freq: Once | INTRAVENOUS | Status: AC
  Administered 2017-02-21: 13:00:00 via INTRAVENOUS
  Filled 2017-02-21: qty 8

## 2017-02-21 MED ORDER — HEPARIN SOD (PORK) LOCK FLUSH 100 UNIT/ML IV SOLN
500.0000 [IU] | Freq: Once | INTRAVENOUS | Status: AC | PRN
  Administered 2017-02-21: 500 [IU]
  Filled 2017-02-21: qty 5

## 2017-02-21 MED ORDER — SODIUM CHLORIDE 0.9 % IV SOLN
Freq: Once | INTRAVENOUS | Status: AC
  Administered 2017-02-21: 14:00:00 via INTRAVENOUS

## 2017-02-21 NOTE — Patient Instructions (Signed)
Carfilzomib injection What is this medicine? CARFILZOMIB (kar FILZ oh mib) targets a specific protein within cancer cells and stops the cancer cells from growing. It is used to treat multiple myeloma. This medicine may be used for other purposes; ask your health care provider or pharmacist if you have questions. COMMON BRAND NAME(S): KYPROLIS What should I tell my health care provider before I take this medicine? They need to know if you have any of these conditions: -heart disease -history of blood clots -irregular heartbeat -kidney disease -liver disease -lung or breathing disease -an unusual or allergic reaction to carfilzomib, or other medicines, foods, dyes, or preservatives -pregnant or trying to get pregnant -breast-feeding How should I use this medicine? This medicine is for injection or infusion into a vein. It is given by a health care professional in a hospital or clinic setting. Talk to your pediatrician regarding the use of this medicine in children. Special care may be needed. Overdosage: If you think you have taken too much of this medicine contact a poison control center or emergency room at once. NOTE: This medicine is only for you. Do not share this medicine with others. What if I miss a dose? It is important not to miss your dose. Call your doctor or health care professional if you are unable to keep an appointment. What may interact with this medicine? Interactions are not expected. Give your health care provider a list of all the medicines, herbs, non-prescription drugs, or dietary supplements you use. Also tell them if you smoke, drink alcohol, or use illegal drugs. Some items may interact with your medicine. This list may not describe all possible interactions. Give your health care provider a list of all the medicines, herbs, non-prescription drugs, or dietary supplements you use. Also tell them if you smoke, drink alcohol, or use illegal drugs. Some items may  interact with your medicine. What should I watch for while using this medicine? Your condition will be monitored carefully while you are receiving this medicine. Report any side effects. Continue your course of treatment even though you feel ill unless your doctor tells you to stop. You may need blood work done while you are taking this medicine. Do not become pregnant while taking this medicine or for at least 30 days after stopping it. Women should inform their doctor if they wish to become pregnant or think they might be pregnant. There is a potential for serious side effects to an unborn child. Men should not father a child while taking this medicine and for 90 days after stopping it. Talk to your health care professional or pharmacist for more information. Do not breast-feed an infant while taking this medicine. Check with your doctor or health care professional if you get an attack of severe diarrhea, nausea and vomiting, or if you sweat a lot. The loss of too much body fluid can make it dangerous for you to take this medicine. You may get dizzy. Do not drive, use machinery, or do anything that needs mental alertness until you know how this medicine affects you. Do not stand or sit up quickly, especially if you are an older patient. This reduces the risk of dizzy or fainting spells. What side effects may I notice from receiving this medicine? Side effects that you should report to your doctor or health care professional as soon as possible: -allergic reactions like skin rash, itching or hives, swelling of the face, lips, or tongue -confusion -dizziness -feeling faint or lightheaded -fever or chills -  palpitations -seizures -signs and symptoms of bleeding such as bloody or black, tarry stools; red or dark-brown urine; spitting up blood or brown material that looks like coffee grounds; red spots on the skin; unusual bruising or bleeding including from the eye, gums, or nose -signs and symptoms of  a blood clot such as breathing problems; changes in vision; chest pain; severe, sudden headache; pain, swelling, warmth in the leg; trouble speaking; sudden numbness or weakness of the face, arm or leg -signs and symptoms of kidney injury like trouble passing urine or change in the amount of urine -signs and symptoms of liver injury like dark yellow or brown urine; general ill feeling or flu-like symptoms; light-colored stools; loss of appetite; nausea; right upper belly pain; unusually weak or tired; yellowing of the eyes or skin Side effects that usually do not require medical attention (report to your doctor or health care professional if they continue or are bothersome): -back pain -cough -diarrhea -headache -muscle cramps -vomiting This list may not describe all possible side effects. Call your doctor for medical advice about side effects. You may report side effects to FDA at 1-800-FDA-1088. Where should I keep my medicine? This drug is given in a hospital or clinic and will not be stored at home. NOTE: This sheet is a summary. It may not cover all possible information. If you have questions about this medicine, talk to your doctor, pharmacist, or health care provider.  2018 Elsevier/Gold Standard (2015-02-18 13:39:23)  

## 2017-02-23 LAB — UIFE/LIGHT CHAINS/TP QN, 24-HR UR
% BETA, Urine: 20 %
ALPHA 1 URINE: 7.5 %
Albumin, U: 36.3 %
Alpha 2, Urine: 16.4 %
Free Kappa Lt Chains,Ur: 9.57 mg/L (ref 1.35–24.19)
Free Kappa/Lambda Ratio: 0.72 — ABNORMAL LOW (ref 2.04–10.37)
Free Lambda Lt Chains,Ur: 13.3 mg/L — ABNORMAL HIGH (ref 0.24–6.66)
GAMMA GLOBULIN URINE: 19.8 %
Total Protein, Urine-Ur/day: 90 mg/24 hr (ref 30–150)
Total Protein, Urine: 10.6 mg/dL
Total Volume: 850

## 2017-02-26 ENCOUNTER — Telehealth: Payer: Self-pay | Admitting: *Deleted

## 2017-02-26 NOTE — Telephone Encounter (Addendum)
Patient is aware of results  ----- Message from [name], MD sent at 02/23/2017  6:32 AM EST ----- Call - the myeloma level went down by 60%!! Great job!!   [name]

## 2017-03-01 ENCOUNTER — Telehealth: Payer: Self-pay | Admitting: Hematology & Oncology

## 2017-03-01 NOTE — Telephone Encounter (Signed)
Faxed medical records to:  [facility] [address], [place], [place] [address] P: [phone] C:[phone]       COPY SCANNED

## 2017-03-02 ENCOUNTER — Telehealth: Payer: Self-pay | Admitting: *Deleted

## 2017-03-02 NOTE — Telephone Encounter (Signed)
Faxed records to Dr. Baird Cancer reference [place] [phone].

## 2017-03-05 ENCOUNTER — Ambulatory Visit: Payer: BC Managed Care – PPO

## 2017-03-05 ENCOUNTER — Ambulatory Visit: Payer: BC Managed Care – PPO | Admitting: Hematology & Oncology

## 2017-03-05 ENCOUNTER — Other Ambulatory Visit: Payer: BC Managed Care – PPO

## 2017-03-05 LAB — KAPPA/LAMBDA LIGHT CHAINS
Kappa free light chain: 5.2 mg/L (ref 3.3–19.4)
Kappa, lambda light chain ratio: 0.35 (ref 0.26–1.65)
Lambda free light chains: 15 mg/L (ref 5.7–26.3)

## 2017-03-05 LAB — IGG, IGA, IGM
IgA: 40 mg/dL — ABNORMAL LOW (ref 87–352)
IgG (Immunoglobin G), Serum: 459 mg/dL — ABNORMAL LOW (ref 700–1600)
IgM (Immunoglobulin M), Srm: 12 mg/dL — ABNORMAL LOW (ref 26–217)

## 2017-03-07 ENCOUNTER — Ambulatory Visit: Payer: BC Managed Care – PPO | Admitting: Hematology & Oncology

## 2017-03-07 ENCOUNTER — Ambulatory Visit: Payer: BC Managed Care – PPO

## 2017-03-07 ENCOUNTER — Other Ambulatory Visit: Payer: BC Managed Care – PPO

## 2017-03-09 ENCOUNTER — Other Ambulatory Visit: Payer: Self-pay | Admitting: *Deleted

## 2017-03-09 MED ORDER — POMALIDOMIDE 1 MG PO CAPS: ORAL_CAPSULE | ORAL | 0 refills | Status: DC

## 2017-03-14 ENCOUNTER — Inpatient Hospital Stay: Payer: BC Managed Care – PPO

## 2017-03-14 ENCOUNTER — Encounter: Payer: Self-pay | Admitting: Family

## 2017-03-14 ENCOUNTER — Other Ambulatory Visit: Payer: Self-pay

## 2017-03-14 ENCOUNTER — Inpatient Hospital Stay (HOSPITAL_BASED_OUTPATIENT_CLINIC_OR_DEPARTMENT_OTHER): Payer: BC Managed Care – PPO | Admitting: Family

## 2017-03-14 ENCOUNTER — Inpatient Hospital Stay: Payer: BC Managed Care – PPO | Attending: Hematology & Oncology

## 2017-03-14 ENCOUNTER — Telehealth: Payer: Self-pay | Admitting: Pharmacist

## 2017-03-14 VITALS — BP 149/85 | HR 69 | Temp 98.2°F | Resp 18

## 2017-03-14 DIAGNOSIS — Z9484 Stem cells transplant status: Secondary | ICD-10-CM | POA: Diagnosis not present

## 2017-03-14 DIAGNOSIS — C9001 Multiple myeloma in remission: Secondary | ICD-10-CM

## 2017-03-14 DIAGNOSIS — Z79811 Long term (current) use of aromatase inhibitors: Secondary | ICD-10-CM

## 2017-03-14 DIAGNOSIS — D508 Other iron deficiency anemias: Secondary | ICD-10-CM

## 2017-03-14 DIAGNOSIS — C9000 Multiple myeloma not having achieved remission: Secondary | ICD-10-CM

## 2017-03-14 DIAGNOSIS — Z79899 Other long term (current) drug therapy: Secondary | ICD-10-CM | POA: Insufficient documentation

## 2017-03-14 DIAGNOSIS — Z923 Personal history of irradiation: Secondary | ICD-10-CM | POA: Insufficient documentation

## 2017-03-14 DIAGNOSIS — Z5112 Encounter for antineoplastic immunotherapy: Secondary | ICD-10-CM | POA: Insufficient documentation

## 2017-03-14 DIAGNOSIS — Z7982 Long term (current) use of aspirin: Secondary | ICD-10-CM

## 2017-03-14 DIAGNOSIS — D500 Iron deficiency anemia secondary to blood loss (chronic): Secondary | ICD-10-CM

## 2017-03-14 DIAGNOSIS — C541 Malignant neoplasm of endometrium: Secondary | ICD-10-CM | POA: Diagnosis not present

## 2017-03-14 LAB — CBC WITH DIFFERENTIAL (CANCER CENTER ONLY)
Basophils Absolute: 0.1 10*3/uL (ref 0.0–0.1)
Basophils Relative: 1 %
Eosinophils Absolute: 0.2 10*3/uL (ref 0.0–0.5)
Eosinophils Relative: 5 %
HCT: 31.1 % — ABNORMAL LOW (ref 34.8–46.6)
Hemoglobin: 10.3 g/dL — ABNORMAL LOW (ref 11.6–15.9)
Lymphocytes Relative: 18 %
Lymphs Abs: 0.8 10*3/uL — ABNORMAL LOW (ref 0.9–3.3)
MCH: 26.8 pg (ref 26.0–34.0)
MCHC: 33.1 g/dL (ref 32.0–36.0)
MCV: 80.8 fL — ABNORMAL LOW (ref 81.0–101.0)
Monocytes Absolute: 0.7 10*3/uL (ref 0.1–0.9)
Monocytes Relative: 16 %
Neutro Abs: 2.6 10*3/uL (ref 1.5–6.5)
Neutrophils Relative %: 60 %
Platelet Count: 221 10*3/uL (ref 145–400)
RBC: 3.85 MIL/uL (ref 3.70–5.32)
RDW: 17.4 % — ABNORMAL HIGH (ref 11.1–15.7)
WBC Count: 4.3 10*3/uL (ref 3.9–10.0)

## 2017-03-14 LAB — PROTEIN ELECTROPHORESIS, SERUM, WITH REFLEX
A/G Ratio: 1.7 (ref 0.7–1.7)
Albumin ELP: 3.8 g/dL (ref 2.9–4.4)
Alpha-1-Globulin: 0.2 g/dL (ref 0.0–0.4)
Alpha-2-Globulin: 0.7 g/dL (ref 0.4–1.0)
Beta Globulin: 1 g/dL (ref 0.7–1.3)
Gamma Globulin: 0.4 g/dL (ref 0.4–1.8)
Globulin, Total: 2.3 g/dL (ref 2.2–3.9)
Total Protein ELP: 6.1 g/dL (ref 6.0–8.5)

## 2017-03-14 LAB — IRON AND TIBC
Iron: 57 ug/dL (ref 41–142)
Saturation Ratios: 15 % — ABNORMAL LOW (ref 21–57)
TIBC: 389 ug/dL (ref 236–444)
UIBC: 332 ug/dL

## 2017-03-14 LAB — CMP (CANCER CENTER ONLY)
ALT: 19 U/L (ref 10–47)
AST: 18 U/L (ref 11–38)
Albumin: 3.7 g/dL (ref 3.5–5.0)
Alkaline Phosphatase: 40 U/L (ref 26–84)
Anion gap: 9 (ref 5–15)
BUN: 21 mg/dL (ref 7–22)
CO2: 27 mmol/L (ref 18–33)
Calcium: 9.2 mg/dL (ref 8.0–10.3)
Chloride: 106 mmol/L (ref 98–108)
Creatinine: 1.1 mg/dL (ref 0.60–1.20)
Glucose, Bld: 95 mg/dL (ref 73–118)
Potassium: 3.2 mmol/L — ABNORMAL LOW (ref 3.3–4.7)
Sodium: 142 mmol/L (ref 128–145)
Total Bilirubin: 0.6 mg/dL (ref 0.2–1.6)
Total Protein: 6.3 g/dL — ABNORMAL LOW (ref 6.4–8.1)

## 2017-03-14 LAB — FERRITIN: Ferritin: 60 ng/mL (ref 9–269)

## 2017-03-14 MED ORDER — SODIUM CHLORIDE 0.9 % IV SOLN: Freq: Once | INTRAVENOUS | Status: DC

## 2017-03-14 MED ORDER — DEXAMETHASONE SODIUM PHOSPHATE 10 MG/ML IJ SOLN
10.0000 mg | Freq: Once | INTRAMUSCULAR | Status: AC
  Administered 2017-03-14: 10 mg via INTRAVENOUS

## 2017-03-14 MED ORDER — SODIUM CHLORIDE 0.9 % IV SOLN
Freq: Once | INTRAVENOUS | Status: DC
  Filled 2017-03-14: qty 8

## 2017-03-14 MED ORDER — DEXAMETHASONE SODIUM PHOSPHATE 10 MG/ML IJ SOLN
INTRAMUSCULAR | Status: AC
  Filled 2017-03-14: qty 1

## 2017-03-14 MED ORDER — HEPARIN SOD (PORK) LOCK FLUSH 100 UNIT/ML IV SOLN
500.0000 [IU] | Freq: Once | INTRAVENOUS | Status: AC | PRN
  Administered 2017-03-14: 500 [IU]
  Filled 2017-03-14: qty 5

## 2017-03-14 MED ORDER — CARFILZOMIB CHEMO INJECTION 60 MG
56.0000 mg/m2 | Freq: Once | INTRAVENOUS | Status: AC
  Administered 2017-03-14: 112 mg via INTRAVENOUS
  Filled 2017-03-14: qty 56

## 2017-03-14 MED ORDER — DEXTROSE 5 % IV SOLN: 56.0000 mg/m2 | Freq: Once | INTRAVENOUS | Status: DC

## 2017-03-14 MED ORDER — SODIUM CHLORIDE 0.9% FLUSH
10.0000 mL | INTRAVENOUS | Status: DC | PRN
  Administered 2017-03-14: 10 mL
  Filled 2017-03-14: qty 10

## 2017-03-14 MED ORDER — SODIUM CHLORIDE 0.9 % IV SOLN
Freq: Once | INTRAVENOUS | Status: AC
  Administered 2017-03-14: 10:00:00 via INTRAVENOUS

## 2017-03-14 MED ORDER — GRANISETRON 3.1 MG/24HR TD PTCH: MEDICATED_PATCH | TRANSDERMAL | 0 refills | Status: DC

## 2017-03-14 NOTE — Progress Notes (Signed)
Hematology and Oncology Follow Up Visit  Emily Greer [id number] 08-20-1950 67 y.o. 03/14/2017   Principle Diagnosis:  Recurrent lambda light chain myeloma History of recurrent endometrial carcinoma  Past Therapy: Status post second autologous stem cell transplant on 07/24/2014 Maintenance therapy with Pomalidomide/every 2 week Velcade - d/c'ed Radiation therapy for endometrial recurrence - completed 04/20/2015  Current Therapy:   Xgeva 120 mg subcutaneous every 3 - next dose due April 2019 Femara 2.5mg  po q day Pomalyst/Kyprolis 70mg /m2 IV q 2 weeks -s/p cycle 5   Interim History:  Emily Greer is here today for follow-up and treatment. She is doing quite well and has no complaints at this time. She states that she is taking her Femara daily and Pomalyst on 21 days, off 7 days as prescribed .   Lambda light chain in January was 1.50 mg/dL.  No fever, chills, n/v, cough, rash, dizziness, SOB, chest pain, palpitations, abdominal pain or changes in bowel or bladder habits.  No bleeding, bruising or petechiae. No lymphadenopathy found on exam.  She states that she is due for a colonoscopy this year and will contact Dr. Matthias Hughs.  She has puffiness in her feet and ankles that comes and goes when she eats salt. No tenderness, numbness or tingling in her extremities. No c/o pain.  She has maintained a good appetite and is staying well hydrated. Her weight is stable.   ECOG Performance Status: 1 - Symptomatic but completely ambulatory  Medications:  Allergies as of 03/14/2017      Reactions   Codeine Nausea Only      Medication List        Accurate as of 03/14/17  8:57 AM. Always use your most recent med list.          acyclovir 400 MG tablet Commonly known as:  ZOVIRAX Take 1 tablet (400 mg total) by mouth daily.   albuterol 108 (90 Base) MCG/ACT inhaler Commonly known as:  PROVENTIL HFA;VENTOLIN HFA Inhale 2 puffs into the lungs every 6 (six) hours as needed for  wheezing or shortness of breath. 2 puffs 3 times daily x 5 days then every 6 hours as needed.   amLODipine 10 MG tablet Commonly known as:  NORVASC TAKE 1 TABLET BY MOUTH DAILY--- takes in am   aspirin EC 81 MG tablet Take 81 mg by mouth 2 (two) times daily.   granisetron 3.1 MG/24HR Commonly known as:  SANCUSO Apply to skin starting 24 hours before chemotherapy. Remove after 7 days.   letrozole 2.5 MG tablet Commonly known as:  FEMARA Take 1 tablet (2.5 mg total) by mouth daily.   lidocaine-prilocaine cream Commonly known as:  EMLA Apply to affected area once   loperamide 2 MG capsule Commonly known as:  IMODIUM Take by mouth as needed for diarrhea or loose stools. Reported on 08/19/2015   loratadine 10 MG tablet Commonly known as:  CLARITIN Take 10 mg by mouth every morning. Reported on 08/19/2015   NASACORT ALLERGY 24HR 55 MCG/ACT Aero nasal inhaler Generic drug:  triamcinolone Place 2 sprays into the nose daily.   NOREL AD 4-10-325 MG Tabs Generic drug:  Chlorphen-PE-Acetaminophen Take by mouth.   ondansetron 8 MG tablet Commonly known as:  ZOFRAN Take 1 tablet (8 mg total) by mouth 2 (two) times daily as needed (Nausea or vomiting).   pomalidomide 1 MG capsule Commonly known as:  POMALYST Take with water on days 1-21. Repeat every 28 days. [id number]   potassium chloride SA 20 MEQ  tablet Commonly known as:  K-DUR,KLOR-CON TAKE 1 TABLET(20 MEQ) BY MOUTH TWICE DAILY   prochlorperazine 10 MG tablet Commonly known as:  COMPAZINE Take 1 tablet (10 mg total) by mouth every 6 (six) hours as needed (Nausea or vomiting).   triamterene-hydrochlorothiazide 37.5-25 MG tablet Commonly known as:  MAXZIDE-25 TK 1 T PO QD   Vitamin D3 2000 units Tabs Take 1 tablet by mouth daily.       Allergies:  Allergies  Allergen Reactions  . Codeine Nausea Only    Past Medical History, Surgical history, Social history, and Family History were reviewed and  updated.  Review of Systems: All other 10 point review of systems is negative.   Physical Exam:  vitals were not taken for this visit.   Wt Readings from Last 3 Encounters:  02/05/17 190 lb (86.2 kg)  01/18/17 186 lb 11.2 oz (84.7 kg)  12/25/16 190 lb (86.2 kg)    Ocular: Sclerae unicteric, pupils equal, round and reactive to light Ear-nose-throat: Oropharynx clear, dentition fair Lymphatic: No cervical, supraclavicular or axillary adenopathy Lungs no rales or rhonchi, good excursion bilaterally Heart regular rate and rhythm, no murmur appreciated Abd soft, nontender, positive bowel sounds, no liver or spleen tip palpated on exam, no fluid wave  MSK no focal spinal tenderness, no joint edema Neuro: non-focal, well-oriented, appropriate affect Breasts: Deferred   Lab Results  Component Value Date   WBC 5.1 02/21/2017   HGB 9.8 (L) 01/10/2017   HCT 33.3 (L) 02/21/2017   MCV 82.8 02/21/2017   PLT 255 02/21/2017   Lab Results  Component Value Date   FERRITIN 227 12/31/2014   IRON 13 (L) 12/31/2014   TIBC 302 12/31/2014   UIBC 289 12/31/2014   IRONPCTSAT 4 (L) 12/31/2014   Lab Results  Component Value Date   RETICCTPCT 0.6 12/31/2014   RBC 4.02 02/21/2017   Lab Results  Component Value Date   KPAFRELGTCHN 5.2 02/05/2017   LAMBDASER 15.0 02/05/2017   KAPLAMBRATIO 0.72 (L) 02/21/2017   Lab Results  Component Value Date   IGGSERUM 459 (L) 02/05/2017   IGA 40 (L) 02/05/2017   IGMSERUM 12 (L) 02/05/2017   Lab Results  Component Value Date   TOTALPROTELP 6.1 02/05/2017   ALBUMINELP 3.8 02/05/2017   A1GS 0.2 02/05/2017   A2GS 0.7 02/05/2017   BETS 1.0 02/05/2017   BETA2SER 0.4 11/23/2014   GAMS 0.4 02/05/2017   MSPIKE Not Observed 02/05/2017   SPEI * 11/23/2014     Chemistry      Component Value Date/Time   NA 143 02/21/2017 1140   NA 141 01/10/2017 1115   NA 140 06/21/2016 0918   K 3.2 (L) 02/21/2017 1140   K 4.0 01/10/2017 1115   K 4.3 06/21/2016  0918   CL 106 02/21/2017 1140   CL 106 01/10/2017 1115   CO2 27 02/21/2017 1140   CO2 27 01/10/2017 1115   CO2 20 (L) 06/21/2016 0918   BUN 15 02/21/2017 1140   BUN 15 01/10/2017 1115   BUN 15.8 06/21/2016 0918   CREATININE 0.70 02/21/2017 1140   CREATININE 1.0 01/10/2017 1115   CREATININE 0.8 06/21/2016 0918      Component Value Date/Time   CALCIUM 9.3 02/21/2017 1140   CALCIUM 9.5 01/10/2017 1115   CALCIUM 9.4 06/21/2016 0918   ALKPHOS 46 02/21/2017 1140   ALKPHOS 40 01/10/2017 1115   ALKPHOS 66 06/21/2016 0918   AST 17 02/21/2017 1140   AST 17 06/21/2016 4259  ALT 14 02/21/2017 1140   ALT 19 01/10/2017 1115   ALT 37 06/21/2016 0918   BILITOT 0.6 02/21/2017 1140   BILITOT 0.32 06/21/2016 0918      Impression and Plan: Ms. Bubier is a very pleasant 67 yo African American female with recurrent lambda light chain myeloma. She also has history of localized recurrent endometrial cancer.  She is doing well and tolerating her treatment regimen nicely.  We will proceed with treatment today as planned.  Her Sancuso patch was refilled.  We will plan to see her back in another month for follow-up, lab and infusion.  She will contact our office with any questions or concerns. We can certainly see her sooner if need be.   Emeline Gins, NP 2/13/20198:57 AM

## 2017-03-14 NOTE — Telephone Encounter (Signed)
Ondansetron removed from today's premedications. The patient has verified that she is currently wearing a Sancuso patch. She will still receive received dexamethasone 10 mg IV. Spoke with patient, she states understanding.

## 2017-03-14 NOTE — Patient Instructions (Signed)
[facility] Discharge Instructions for Patients Receiving Chemotherapy  Today you received the following chemotherapy agents:  Kyprolis  To help prevent nausea and vomiting after your treatment, we encourage you to take your nausea medication as ordered per MD.    If you develop nausea and vomiting that is not controlled by your nausea medication, call the clinic.   BELOW ARE SYMPTOMS THAT SHOULD BE REPORTED IMMEDIATELY:  *FEVER GREATER THAN 100.5 F  *CHILLS WITH OR WITHOUT FEVER  NAUSEA AND VOMITING THAT IS NOT CONTROLLED WITH YOUR NAUSEA MEDICATION  *UNUSUAL SHORTNESS OF BREATH  *UNUSUAL BRUISING OR BLEEDING  TENDERNESS IN MOUTH AND THROAT WITH OR WITHOUT PRESENCE OF ULCERS  *URINARY PROBLEMS  *BOWEL PROBLEMS  UNUSUAL RASH Items with * indicate a potential emergency and should be followed up as soon as possible.  Feel free to call the clinic should you have any questions or concerns. The clinic phone number is [phone].  Please show the CHEMO ALERT CARD at check-in to the Emergency Department and triage nurse.   

## 2017-03-15 LAB — IGG, IGA, IGM
IgA: 28 mg/dL — ABNORMAL LOW (ref 87–352)
IgG (Immunoglobin G), Serum: 379 mg/dL — ABNORMAL LOW (ref 700–1600)
IgM (Immunoglobulin M), Srm: 8 mg/dL — ABNORMAL LOW (ref 26–217)

## 2017-03-15 LAB — KAPPA/LAMBDA LIGHT CHAINS
Kappa free light chain: 5.1 mg/L (ref 3.3–19.4)
Kappa, lambda light chain ratio: 0.27 (ref 0.26–1.65)
Lambda free light chains: 19.1 mg/L (ref 5.7–26.3)

## 2017-03-16 LAB — PROTEIN ELECTROPHORESIS, SERUM, WITH REFLEX
A/G Ratio: 1.6 (ref 0.7–1.7)
Albumin ELP: 3.6 g/dL (ref 2.9–4.4)
Alpha-1-Globulin: 0.2 g/dL (ref 0.0–0.4)
Alpha-2-Globulin: 0.7 g/dL (ref 0.4–1.0)
Beta Globulin: 1 g/dL (ref 0.7–1.3)
Gamma Globulin: 0.4 g/dL (ref 0.4–1.8)
Globulin, Total: 2.2 g/dL (ref 2.2–3.9)
Total Protein ELP: 5.8 g/dL — ABNORMAL LOW (ref 6.0–8.5)

## 2017-03-21 ENCOUNTER — Ambulatory Visit: Payer: BC Managed Care – PPO

## 2017-03-21 ENCOUNTER — Other Ambulatory Visit: Payer: BC Managed Care – PPO

## 2017-03-27 ENCOUNTER — Other Ambulatory Visit: Payer: Self-pay | Admitting: *Deleted

## 2017-03-27 DIAGNOSIS — C9000 Multiple myeloma not having achieved remission: Secondary | ICD-10-CM

## 2017-03-28 ENCOUNTER — Inpatient Hospital Stay: Payer: BC Managed Care – PPO

## 2017-03-28 DIAGNOSIS — C9000 Multiple myeloma not having achieved remission: Secondary | ICD-10-CM

## 2017-03-28 DIAGNOSIS — Z5112 Encounter for antineoplastic immunotherapy: Secondary | ICD-10-CM | POA: Diagnosis not present

## 2017-03-28 LAB — CMP (CANCER CENTER ONLY)
ALT: 19 U/L (ref 10–47)
AST: 18 U/L (ref 11–38)
Albumin: 3.6 g/dL (ref 3.5–5.0)
Alkaline Phosphatase: 48 U/L (ref 26–84)
Anion gap: 9 (ref 5–15)
BUN: 17 mg/dL (ref 7–22)
CO2: 27 mmol/L (ref 18–33)
Calcium: 9.5 mg/dL (ref 8.0–10.3)
Chloride: 108 mmol/L (ref 98–108)
Creatinine: 1.1 mg/dL (ref 0.60–1.20)
Glucose, Bld: 96 mg/dL (ref 73–118)
Potassium: 3.6 mmol/L (ref 3.3–4.7)
Sodium: 144 mmol/L (ref 128–145)
Total Bilirubin: 0.5 mg/dL (ref 0.2–1.6)
Total Protein: 6.8 g/dL (ref 6.4–8.1)

## 2017-03-28 LAB — CBC WITH DIFFERENTIAL (CANCER CENTER ONLY)
Basophils Absolute: 0.1 10*3/uL (ref 0.0–0.1)
Basophils Relative: 3 %
Eosinophils Absolute: 0.1 10*3/uL (ref 0.0–0.5)
Eosinophils Relative: 3 %
HCT: 31.5 % — ABNORMAL LOW (ref 34.8–46.6)
Hemoglobin: 10.4 g/dL — ABNORMAL LOW (ref 11.6–15.9)
Lymphocytes Relative: 23 %
Lymphs Abs: 0.9 10*3/uL (ref 0.9–3.3)
MCH: 26.4 pg (ref 26.0–34.0)
MCHC: 33 g/dL (ref 32.0–36.0)
MCV: 79.9 fL — ABNORMAL LOW (ref 81.0–101.0)
Monocytes Absolute: 0.4 10*3/uL (ref 0.1–0.9)
Monocytes Relative: 11 %
Neutro Abs: 2.3 10*3/uL (ref 1.5–6.5)
Neutrophils Relative %: 60 %
Platelet Count: 352 10*3/uL (ref 145–400)
RBC: 3.94 MIL/uL (ref 3.70–5.32)
RDW: 17.7 % — ABNORMAL HIGH (ref 11.1–15.7)
WBC Count: 3.9 10*3/uL (ref 3.9–10.0)

## 2017-03-28 MED ORDER — DEXTROSE 5 % IV SOLN
56.0000 mg/m2 | Freq: Once | INTRAVENOUS | Status: AC
  Administered 2017-03-28: 112 mg via INTRAVENOUS
  Filled 2017-03-28: qty 56

## 2017-03-28 MED ORDER — DEXAMETHASONE SODIUM PHOSPHATE 100 MG/10ML IJ SOLN
Freq: Once | INTRAMUSCULAR | Status: AC
  Administered 2017-03-28: 11:00:00 via INTRAVENOUS
  Filled 2017-03-28: qty 8

## 2017-03-28 MED ORDER — HEPARIN SOD (PORK) LOCK FLUSH 100 UNIT/ML IV SOLN
500.0000 [IU] | Freq: Once | INTRAVENOUS | Status: AC | PRN
  Administered 2017-03-28: 500 [IU]
  Filled 2017-03-28: qty 5

## 2017-03-28 MED ORDER — SODIUM CHLORIDE 0.9 % IV SOLN
Freq: Once | INTRAVENOUS | Status: AC
  Administered 2017-03-28: 10:00:00 via INTRAVENOUS

## 2017-03-28 MED ORDER — SODIUM CHLORIDE 0.9% FLUSH
10.0000 mL | INTRAVENOUS | Status: DC | PRN
  Administered 2017-03-28: 10 mL
  Filled 2017-03-28: qty 10

## 2017-03-28 MED ORDER — SODIUM CHLORIDE 0.9 % IV SOLN: Freq: Once | INTRAVENOUS | Status: DC

## 2017-03-28 NOTE — Patient Instructions (Signed)
Carfilzomib injection What is this medicine? CARFILZOMIB (kar FILZ oh mib) targets a specific protein within cancer cells and stops the cancer cells from growing. It is used to treat multiple myeloma. This medicine may be used for other purposes; ask your health care provider or pharmacist if you have questions. COMMON BRAND NAME(S): KYPROLIS What should I tell my health care provider before I take this medicine? They need to know if you have any of these conditions: -heart disease -history of blood clots -irregular heartbeat -kidney disease -liver disease -lung or breathing disease -an unusual or allergic reaction to carfilzomib, or other medicines, foods, dyes, or preservatives -pregnant or trying to get pregnant -breast-feeding How should I use this medicine? This medicine is for injection or infusion into a vein. It is given by a health care professional in a hospital or clinic setting. Talk to your pediatrician regarding the use of this medicine in children. Special care may be needed. Overdosage: If you think you have taken too much of this medicine contact a poison control center or emergency room at once. NOTE: This medicine is only for you. Do not share this medicine with others. What if I miss a dose? It is important not to miss your dose. Call your doctor or health care professional if you are unable to keep an appointment. What may interact with this medicine? Interactions are not expected. Give your health care provider a list of all the medicines, herbs, non-prescription drugs, or dietary supplements you use. Also tell them if you smoke, drink alcohol, or use illegal drugs. Some items may interact with your medicine. This list may not describe all possible interactions. Give your health care provider a list of all the medicines, herbs, non-prescription drugs, or dietary supplements you use. Also tell them if you smoke, drink alcohol, or use illegal drugs. Some items may  interact with your medicine. What should I watch for while using this medicine? Your condition will be monitored carefully while you are receiving this medicine. Report any side effects. Continue your course of treatment even though you feel ill unless your doctor tells you to stop. You may need blood work done while you are taking this medicine. Do not become pregnant while taking this medicine or for at least 30 days after stopping it. Women should inform their doctor if they wish to become pregnant or think they might be pregnant. There is a potential for serious side effects to an unborn child. Men should not father a child while taking this medicine and for 90 days after stopping it. Talk to your health care professional or pharmacist for more information. Do not breast-feed an infant while taking this medicine. Check with your doctor or health care professional if you get an attack of severe diarrhea, nausea and vomiting, or if you sweat a lot. The loss of too much body fluid can make it dangerous for you to take this medicine. You may get dizzy. Do not drive, use machinery, or do anything that needs mental alertness until you know how this medicine affects you. Do not stand or sit up quickly, especially if you are an older patient. This reduces the risk of dizzy or fainting spells. What side effects may I notice from receiving this medicine? Side effects that you should report to your doctor or health care professional as soon as possible: -allergic reactions like skin rash, itching or hives, swelling of the face, lips, or tongue -confusion -dizziness -feeling faint or lightheaded -fever or chills -  palpitations -seizures -signs and symptoms of bleeding such as bloody or black, tarry stools; red or dark-brown urine; spitting up blood or brown material that looks like coffee grounds; red spots on the skin; unusual bruising or bleeding including from the eye, gums, or nose -signs and symptoms of  a blood clot such as breathing problems; changes in vision; chest pain; severe, sudden headache; pain, swelling, warmth in the leg; trouble speaking; sudden numbness or weakness of the face, arm or leg -signs and symptoms of kidney injury like trouble passing urine or change in the amount of urine -signs and symptoms of liver injury like dark yellow or brown urine; general ill feeling or flu-like symptoms; light-colored stools; loss of appetite; nausea; right upper belly pain; unusually weak or tired; yellowing of the eyes or skin Side effects that usually do not require medical attention (report to your doctor or health care professional if they continue or are bothersome): -back pain -cough -diarrhea -headache -muscle cramps -vomiting This list may not describe all possible side effects. Call your doctor for medical advice about side effects. You may report side effects to FDA at 1-800-FDA-1088. Where should I keep my medicine? This drug is given in a hospital or clinic and will not be stored at home. NOTE: This sheet is a summary. It may not cover all possible information. If you have questions about this medicine, talk to your doctor, pharmacist, or health care provider.  2018 Elsevier/Gold Standard (2015-02-18 13:39:23)  

## 2017-03-28 NOTE — Patient Instructions (Signed)
Implanted Port Insertion, Care After °This sheet gives you information about how to care for yourself after your procedure. Your health care provider may also give you more specific instructions. If you have problems or questions, contact your health care provider. °What can I expect after the procedure? °After your procedure, it is common to have: °· Discomfort at the port insertion site. °· Bruising on the skin over the port. This should improve over 3-4 days. ° °Follow these instructions at home: °Port care °· After your port is placed, you will get a manufacturer's information card. The card has information about your port. Keep this card with you at all times. °· Take care of the port as told by your health care provider. Ask your health care provider if you or a family member can get training for taking care of the port at home. A home health care nurse may also take care of the port. °· Make sure to remember what type of port you have. °Incision care °· Follow instructions from your health care provider about how to take care of your port insertion site. Make sure you: °? Wash your hands with soap and water before you change your bandage (dressing). If soap and water are not available, use hand sanitizer. °? Change your dressing as told by your health care provider. °? Leave stitches (sutures), skin glue, or adhesive strips in place. These skin closures may need to stay in place for 2 weeks or longer. If adhesive strip edges start to loosen and curl up, you may trim the loose edges. Do not remove adhesive strips completely unless your health care provider tells you to do that. °· Check your port insertion site every day for signs of infection. Check for: °? More redness, swelling, or pain. °? More fluid or blood. °? Warmth. °? Pus or a bad smell. °General instructions °· Do not take baths, swim, or use a hot tub until your health care provider approves. °· Do not lift anything that is heavier than 10 lb (4.5  kg) for a week, or as told by your health care provider. °· Ask your health care provider when it is okay to: °? Return to work or school. °? Resume usual physical activities or sports. °· Do not drive for 24 hours if you were given a medicine to help you relax (sedative). °· Take over-the-counter and prescription medicines only as told by your health care provider. °· Wear a medical alert bracelet in case of an emergency. This will tell any health care providers that you have a port. °· Keep all follow-up visits as told by your health care provider. This is important. °Contact a health care provider if: °· You cannot flush your port with saline as directed, or you cannot draw blood from the port. °· You have a fever or chills. °· You have more redness, swelling, or pain around your port insertion site. °· You have more fluid or blood coming from your port insertion site. °· Your port insertion site feels warm to the touch. °· You have pus or a bad smell coming from the port insertion site. °Get help right away if: °· You have chest pain or shortness of breath. °· You have bleeding from your port that you cannot control. °Summary °· Take care of the port as told by your health care provider. °· Change your dressing as told by your health care provider. °· Keep all follow-up visits as told by your health care provider. °  This information is not intended to replace advice given to you by your health care provider. Make sure you discuss any questions you have with your health care provider. °Document Released: 11/06/2012 Document Revised: 12/08/2015 Document Reviewed: 12/08/2015 °Elsevier Interactive Patient Education © 2017 Elsevier Inc[employer] ° °

## 2017-04-04 ENCOUNTER — Other Ambulatory Visit: Payer: BC Managed Care – PPO

## 2017-04-04 ENCOUNTER — Ambulatory Visit: Payer: BC Managed Care – PPO | Admitting: Hematology & Oncology

## 2017-04-04 ENCOUNTER — Ambulatory Visit: Payer: BC Managed Care – PPO

## 2017-04-04 ENCOUNTER — Ambulatory Visit: Payer: BC Managed Care – PPO | Admitting: Family

## 2017-04-05 ENCOUNTER — Other Ambulatory Visit: Payer: Self-pay | Admitting: Hematology & Oncology

## 2017-04-05 DIAGNOSIS — C9000 Multiple myeloma not having achieved remission: Secondary | ICD-10-CM

## 2017-04-06 ENCOUNTER — Telehealth: Payer: Self-pay | Admitting: Hematology & Oncology

## 2017-04-06 NOTE — Telephone Encounter (Signed)
We are pleased to inform you that you have been enrolled in the [place]. Your enrollment into the program is valid 02/23/2017 - 12/31/209  Under this program, [employer] will provide assistance to reduce your co-pay to $25 per prescription for POMALIDOMIDE to a max benefit of $10,000.     P: [phone] F: [phone]     COPY SCANNED

## 2017-04-09 ENCOUNTER — Other Ambulatory Visit: Payer: Self-pay | Admitting: Hematology & Oncology

## 2017-04-09 ENCOUNTER — Other Ambulatory Visit: Payer: Self-pay | Admitting: *Deleted

## 2017-04-09 MED ORDER — POMALIDOMIDE 1 MG PO CAPS: ORAL_CAPSULE | ORAL | 0 refills | Status: DC

## 2017-04-11 ENCOUNTER — Inpatient Hospital Stay: Payer: BC Managed Care – PPO

## 2017-04-11 ENCOUNTER — Other Ambulatory Visit: Payer: Self-pay

## 2017-04-11 ENCOUNTER — Inpatient Hospital Stay: Payer: BC Managed Care – PPO | Attending: Hematology & Oncology | Admitting: Hematology & Oncology

## 2017-04-11 ENCOUNTER — Other Ambulatory Visit: Payer: Self-pay | Admitting: *Deleted

## 2017-04-11 ENCOUNTER — Encounter: Payer: Self-pay | Admitting: Hematology & Oncology

## 2017-04-11 VITALS — BP 150/78 | HR 107 | Temp 98.5°F | Resp 18 | Wt 189.5 lb

## 2017-04-11 DIAGNOSIS — C541 Malignant neoplasm of endometrium: Secondary | ICD-10-CM | POA: Insufficient documentation

## 2017-04-11 DIAGNOSIS — C9001 Multiple myeloma in remission: Secondary | ICD-10-CM | POA: Insufficient documentation

## 2017-04-11 DIAGNOSIS — C9000 Multiple myeloma not having achieved remission: Secondary | ICD-10-CM

## 2017-04-11 DIAGNOSIS — Z5112 Encounter for antineoplastic immunotherapy: Secondary | ICD-10-CM | POA: Diagnosis present

## 2017-04-11 DIAGNOSIS — Z79811 Long term (current) use of aromatase inhibitors: Secondary | ICD-10-CM | POA: Insufficient documentation

## 2017-04-11 DIAGNOSIS — Z923 Personal history of irradiation: Secondary | ICD-10-CM

## 2017-04-11 DIAGNOSIS — D508 Other iron deficiency anemias: Secondary | ICD-10-CM

## 2017-04-11 LAB — CBC WITH DIFFERENTIAL (CANCER CENTER ONLY)
Basophils Absolute: 0.1 10*3/uL (ref 0.0–0.1)
Basophils Relative: 2 %
Eosinophils Absolute: 0.3 10*3/uL (ref 0.0–0.5)
Eosinophils Relative: 7 %
HCT: 33.2 % — ABNORMAL LOW (ref 34.8–46.6)
Hemoglobin: 11 g/dL — ABNORMAL LOW (ref 11.6–15.9)
Lymphocytes Relative: 10 %
Lymphs Abs: 0.5 10*3/uL — ABNORMAL LOW (ref 0.9–3.3)
MCH: 26.6 pg (ref 26.0–34.0)
MCHC: 33.1 g/dL (ref 32.0–36.0)
MCV: 80.2 fL — ABNORMAL LOW (ref 81.0–101.0)
Monocytes Absolute: 0.7 10*3/uL (ref 0.1–0.9)
Monocytes Relative: 14 %
Neutro Abs: 3.2 10*3/uL (ref 1.5–6.5)
Neutrophils Relative %: 67 %
Platelet Count: 259 10*3/uL (ref 145–400)
RBC: 4.14 MIL/uL (ref 3.70–5.32)
RDW: 18.9 % — ABNORMAL HIGH (ref 11.1–15.7)
WBC Count: 4.8 10*3/uL (ref 3.9–10.0)

## 2017-04-11 LAB — CMP (CANCER CENTER ONLY)
ALT: 22 U/L (ref 10–47)
AST: 20 U/L (ref 11–38)
Albumin: 4 g/dL (ref 3.5–5.0)
Alkaline Phosphatase: 54 U/L (ref 26–84)
Anion gap: 12 (ref 5–15)
BUN: 16 mg/dL (ref 7–22)
CO2: 29 mmol/L (ref 18–33)
Calcium: 9.8 mg/dL (ref 8.0–10.3)
Chloride: 101 mmol/L (ref 98–108)
Creatinine: 1.1 mg/dL (ref 0.60–1.20)
Glucose, Bld: 94 mg/dL (ref 73–118)
Potassium: 3.1 mmol/L — ABNORMAL LOW (ref 3.3–4.7)
Sodium: 142 mmol/L (ref 128–145)
Total Bilirubin: 0.6 mg/dL (ref 0.2–1.6)
Total Protein: 6.9 g/dL (ref 6.4–8.1)

## 2017-04-11 LAB — IRON AND TIBC
Iron: 66 ug/dL (ref 41–142)
Saturation Ratios: 16 % — ABNORMAL LOW (ref 21–57)
TIBC: 410 ug/dL (ref 236–444)
UIBC: 344 ug/dL

## 2017-04-11 LAB — FERRITIN: Ferritin: 96 ng/mL (ref 9–269)

## 2017-04-11 MED ORDER — DEXAMETHASONE SODIUM PHOSPHATE 10 MG/ML IJ SOLN
10.0000 mg | Freq: Once | INTRAMUSCULAR | Status: AC
  Administered 2017-04-11: 10 mg via INTRAVENOUS

## 2017-04-11 MED ORDER — DEXAMETHASONE SODIUM PHOSPHATE 10 MG/ML IJ SOLN
INTRAMUSCULAR | Status: AC
  Filled 2017-04-11: qty 1

## 2017-04-11 MED ORDER — SODIUM CHLORIDE 0.9 % IV SOLN
Freq: Once | INTRAVENOUS | Status: AC
  Administered 2017-04-11: 10:00:00 via INTRAVENOUS

## 2017-04-11 MED ORDER — HEPARIN SOD (PORK) LOCK FLUSH 100 UNIT/ML IV SOLN
500.0000 [IU] | Freq: Once | INTRAVENOUS | Status: AC | PRN
  Administered 2017-04-11: 500 [IU]
  Filled 2017-04-11: qty 5

## 2017-04-11 MED ORDER — GRANISETRON 3.1 MG/24HR TD PTCH: MEDICATED_PATCH | TRANSDERMAL | 3 refills | Status: DC

## 2017-04-11 MED ORDER — SODIUM CHLORIDE 0.9 % IV SOLN: Freq: Once | INTRAVENOUS | Status: DC

## 2017-04-11 MED ORDER — SODIUM CHLORIDE 0.9% FLUSH
10.0000 mL | INTRAVENOUS | Status: DC | PRN
  Administered 2017-04-11: 10 mL
  Filled 2017-04-11: qty 10

## 2017-04-11 MED ORDER — DEXTROSE 5 % IV SOLN
56.0000 mg/m2 | Freq: Once | INTRAVENOUS | Status: AC
  Administered 2017-04-11: 112 mg via INTRAVENOUS
  Filled 2017-04-11: qty 56

## 2017-04-11 NOTE — Progress Notes (Signed)
MD aware of decrease in potassium. Patient reports taking potassium supplements at home as directed once a day. Will continue with that and was educated on obtaining more potassium through diet. MD stated this was acceptable.

## 2017-04-11 NOTE — Progress Notes (Signed)
Hematology and Oncology Follow Up Visit  Emily Greer [id number] Mar 14, 1950 67 y.o. 04/11/2017   Principle Diagnosis:   Recurrent lambda light chain myeloma  History of recurrent endometrial carcinoma  Current Therapy:    Status post second autologous stem cell transplant on 07/24/2014  Maintenance therapy with Pomalidomide/every 2 week Velcade - d/c'ed  Xgeva 120 mg subcutaneous every 3 months - next dose in April 2019  Radiation therapy for endometrial recurrence - completed 04/20/2015  Femara 2.5mg  po q day       Pomalyst/Kyprolis 70mg /m2 IV q 2 weeks -s/p cycle #5      Interim History:  [name] is back for followup.  Everything is going pretty well.  Her husband is doing a little bit better with his back.  HER-2 children are managing.  One lives in [place].  The other lives in [place].  She is tolerated treatment quite well.  I think she is responded to date.  So far, her lambda light chain is 1.9 mg/dL.  There is no monoclonal spike in the blood.  She is had no fever.  She is had no bleeding.  Her endometrial cancer is still in remission.  I think she follows up with gynecology with that.  Her appetite is doing well.  She is had no nausea or vomiting.  Her leg swelling is also improved.    Overall, I said that her performance status is ECOG 1.    .  Medications: Allergies:  Allergies  Allergen Reactions  . Codeine Nausea Only    Past Medical History, Surgical history, Social history, and Family History were reviewed and updated.  Review of Systems: Review of Systems  Constitutional: Negative.   HENT: Negative.   Eyes: Negative.   Respiratory: Negative.   Cardiovascular: Negative.   Gastrointestinal: Negative.   Genitourinary: Negative.   Musculoskeletal: Negative.   Skin: Negative.   Neurological: Negative.   Endo/Heme/Allergies: Negative.   Psychiatric/Behavioral: Negative.     Physical Exam:  Physical Exam  Constitutional:  She is oriented to person, place, and time.  HENT:  Head: Normocephalic and atraumatic.  Mouth/Throat: Oropharynx is clear and moist.  Eyes: EOM are normal. Pupils are equal, round, and reactive to light.  Neck: Normal range of motion.  Cardiovascular: Normal rate, regular rhythm and normal heart sounds.  Pulmonary/Chest: Effort normal and breath sounds normal.  Abdominal: Soft. Bowel sounds are normal.  Musculoskeletal: Normal range of motion. She exhibits no edema, tenderness or deformity.  Lymphadenopathy:    She has no cervical adenopathy.  Neurological: She is alert and oriented to person, place, and time.  Skin: Skin is warm and dry. No rash noted. No erythema.  Psychiatric: She has a normal mood and affect. Her behavior is normal. Judgment and thought content normal.  Vitals reviewed.    Lab Results  Component Value Date   WBC 4.8 04/11/2017   HGB 9.8 (L) 01/10/2017   HCT 33.2 (L) 04/11/2017   MCV 80.2 (L) 04/11/2017   PLT 259 04/11/2017     Chemistry      Component Value Date/Time   NA 142 04/11/2017 0850   NA 141 01/10/2017 1115   NA 140 06/21/2016 0918   K 3.1 (L) 04/11/2017 0850   K 4.0 01/10/2017 1115   K 4.3 06/21/2016 0918   CL 101 04/11/2017 0850   CL 106 01/10/2017 1115   CO2 29 04/11/2017 0850   CO2 27 01/10/2017 1115   CO2 20 (L) 06/21/2016  0918   BUN 16 04/11/2017 0850   BUN 15 01/10/2017 1115   BUN 15.8 06/21/2016 0918   CREATININE 1.10 04/11/2017 0850   CREATININE 1.0 01/10/2017 1115   CREATININE 0.8 06/21/2016 0918      Component Value Date/Time   CALCIUM 9.8 04/11/2017 0850   CALCIUM 9.5 01/10/2017 1115   CALCIUM 9.4 06/21/2016 0918   ALKPHOS 54 04/11/2017 0850   ALKPHOS 40 01/10/2017 1115   ALKPHOS 66 06/21/2016 0918   AST 20 04/11/2017 0850   AST 17 06/21/2016 0918   ALT 22 04/11/2017 0850   ALT 19 01/10/2017 1115   ALT 37 06/21/2016 0918   BILITOT 0.6 04/11/2017 0850   BILITOT 0.32 06/21/2016 0918         Impression and  Plan: Emily Greer is 67 year old female. She has recurrent lambda light chain myeloma. She also had recurrent endometrial cancer. The recurrence was localized. She is being followed by OB/GYN and radiation oncology for this.   We will proceed with treatment.  We have her treatments every 2 weeks which I think is working well.  I will see her back in 1 more month.  Josph Macho, MD 3/13/20199:28 AM

## 2017-04-11 NOTE — Patient Instructions (Signed)

## 2017-04-12 LAB — PROTEIN ELECTROPHORESIS, SERUM
A/G Ratio: 1.5 (ref 0.7–1.7)
Albumin ELP: 3.7 g/dL (ref 2.9–4.4)
Alpha-1-Globulin: 0.2 g/dL (ref 0.0–0.4)
Alpha-2-Globulin: 0.8 g/dL (ref 0.4–1.0)
Beta Globulin: 1.1 g/dL (ref 0.7–1.3)
Gamma Globulin: 0.3 g/dL — ABNORMAL LOW (ref 0.4–1.8)
Globulin, Total: 2.5 g/dL (ref 2.2–3.9)
Total Protein ELP: 6.2 g/dL (ref 6.0–8.5)

## 2017-04-12 LAB — IGG, IGA, IGM
IgA: 29 mg/dL — ABNORMAL LOW (ref 87–352)
IgG (Immunoglobin G), Serum: 392 mg/dL — ABNORMAL LOW (ref 700–1600)
IgM (Immunoglobulin M), Srm: 12 mg/dL — ABNORMAL LOW (ref 26–217)

## 2017-04-12 LAB — KAPPA/LAMBDA LIGHT CHAINS
Kappa free light chain: 5.3 mg/L (ref 3.3–19.4)
Kappa, lambda light chain ratio: 0.26 (ref 0.26–1.65)
Lambda free light chains: 20.1 mg/L (ref 5.7–26.3)

## 2017-04-24 ENCOUNTER — Other Ambulatory Visit: Payer: Self-pay | Admitting: *Deleted

## 2017-04-24 DIAGNOSIS — C9000 Multiple myeloma not having achieved remission: Secondary | ICD-10-CM

## 2017-04-25 ENCOUNTER — Ambulatory Visit: Payer: BC Managed Care – PPO

## 2017-04-25 ENCOUNTER — Other Ambulatory Visit: Payer: BC Managed Care – PPO

## 2017-04-25 ENCOUNTER — Inpatient Hospital Stay: Payer: BC Managed Care – PPO

## 2017-04-25 DIAGNOSIS — C9000 Multiple myeloma not having achieved remission: Secondary | ICD-10-CM

## 2017-04-25 DIAGNOSIS — C9001 Multiple myeloma in remission: Secondary | ICD-10-CM | POA: Diagnosis not present

## 2017-04-25 LAB — CMP (CANCER CENTER ONLY)
ALT: 21 U/L (ref 10–47)
AST: 17 U/L (ref 11–38)
Albumin: 3.6 g/dL (ref 3.5–5.0)
Alkaline Phosphatase: 45 U/L (ref 26–84)
Anion gap: 5 (ref 5–15)
BUN: 20 mg/dL (ref 7–22)
CO2: 27 mmol/L (ref 18–33)
Calcium: 9.6 mg/dL (ref 8.0–10.3)
Chloride: 107 mmol/L (ref 98–108)
Creatinine: 1 mg/dL (ref 0.60–1.20)
Glucose, Bld: 118 mg/dL (ref 73–118)
Potassium: 3.4 mmol/L (ref 3.3–4.7)
Sodium: 139 mmol/L (ref 128–145)
Total Bilirubin: 0.5 mg/dL (ref 0.2–1.6)
Total Protein: 6.3 g/dL — ABNORMAL LOW (ref 6.4–8.1)

## 2017-04-25 LAB — CBC WITH DIFFERENTIAL (CANCER CENTER ONLY)
Basophils Absolute: 0 10*3/uL (ref 0.0–0.1)
Basophils Relative: 1 %
Eosinophils Absolute: 0.2 10*3/uL (ref 0.0–0.5)
Eosinophils Relative: 5 %
HCT: 30.7 % — ABNORMAL LOW (ref 34.8–46.6)
Hemoglobin: 10 g/dL — ABNORMAL LOW (ref 11.6–15.9)
Lymphocytes Relative: 17 %
Lymphs Abs: 0.8 10*3/uL — ABNORMAL LOW (ref 0.9–3.3)
MCH: 26.7 pg (ref 26.0–34.0)
MCHC: 32.6 g/dL (ref 32.0–36.0)
MCV: 82.1 fL (ref 81.0–101.0)
Monocytes Absolute: 0.3 10*3/uL (ref 0.1–0.9)
Monocytes Relative: 7 %
Neutro Abs: 3.1 10*3/uL (ref 1.5–6.5)
Neutrophils Relative %: 70 %
Platelet Count: 278 10*3/uL (ref 145–400)
RBC: 3.74 MIL/uL (ref 3.70–5.32)
RDW: 19.5 % — ABNORMAL HIGH (ref 11.1–15.7)
WBC Count: 4.5 10*3/uL (ref 3.9–10.0)

## 2017-04-25 MED ORDER — DEXAMETHASONE SODIUM PHOSPHATE 10 MG/ML IJ SOLN
10.0000 mg | Freq: Once | INTRAMUSCULAR | Status: AC
  Administered 2017-04-25: 10 mg via INTRAVENOUS

## 2017-04-25 MED ORDER — SODIUM CHLORIDE 0.9 % IV SOLN: Freq: Once | INTRAVENOUS | Status: DC

## 2017-04-25 MED ORDER — DEXTROSE 5 % IV SOLN
56.0000 mg/m2 | Freq: Once | INTRAVENOUS | Status: AC
  Administered 2017-04-25: 112 mg via INTRAVENOUS
  Filled 2017-04-25: qty 56

## 2017-04-25 MED ORDER — HEPARIN SOD (PORK) LOCK FLUSH 100 UNIT/ML IV SOLN
500.0000 [IU] | Freq: Once | INTRAVENOUS | Status: AC | PRN
  Administered 2017-04-25: 500 [IU]
  Filled 2017-04-25: qty 5

## 2017-04-25 MED ORDER — SODIUM CHLORIDE 0.9 % IV SOLN
Freq: Once | INTRAVENOUS | Status: AC
  Administered 2017-04-25: 11:00:00 via INTRAVENOUS

## 2017-04-25 MED ORDER — DEXAMETHASONE SODIUM PHOSPHATE 10 MG/ML IJ SOLN
INTRAMUSCULAR | Status: AC
  Filled 2017-04-25: qty 1

## 2017-04-25 MED ORDER — SODIUM CHLORIDE 0.9% FLUSH
10.0000 mL | INTRAVENOUS | Status: DC | PRN
  Administered 2017-04-25: 10 mL
  Filled 2017-04-25: qty 10

## 2017-04-25 MED ORDER — SODIUM CHLORIDE 0.9 % IV SOLN
Freq: Once | INTRAVENOUS | Status: AC
  Administered 2017-04-25: 12:00:00 via INTRAVENOUS

## 2017-04-25 NOTE — Patient Instructions (Signed)
[facility] Discharge Instructions for Patients Receiving Chemotherapy  Today you received the following chemotherapy agents:  Kyprolis  To help prevent nausea and vomiting after your treatment, we encourage you to take your nausea medication as ordered per MD.    If you develop nausea and vomiting that is not controlled by your nausea medication, call the clinic.   BELOW ARE SYMPTOMS THAT SHOULD BE REPORTED IMMEDIATELY:  *FEVER GREATER THAN 100.5 F  *CHILLS WITH OR WITHOUT FEVER  NAUSEA AND VOMITING THAT IS NOT CONTROLLED WITH YOUR NAUSEA MEDICATION  *UNUSUAL SHORTNESS OF BREATH  *UNUSUAL BRUISING OR BLEEDING  TENDERNESS IN MOUTH AND THROAT WITH OR WITHOUT PRESENCE OF ULCERS  *URINARY PROBLEMS  *BOWEL PROBLEMS  UNUSUAL RASH Items with * indicate a potential emergency and should be followed up as soon as possible.  Feel free to call the clinic should you have any questions or concerns. The clinic phone number is [phone].  Please show the CHEMO ALERT CARD at check-in to the Emergency Department and triage nurse.   

## 2017-04-25 NOTE — Patient Instructions (Signed)

## 2017-04-25 NOTE — Addendum Note (Signed)
Addended by: Burney Gauze R on: 04/25/2017 10:48 AM   Modules accepted: Orders

## 2017-05-02 ENCOUNTER — Other Ambulatory Visit: Payer: BC Managed Care – PPO

## 2017-05-02 ENCOUNTER — Ambulatory Visit: Payer: BC Managed Care – PPO

## 2017-05-09 ENCOUNTER — Inpatient Hospital Stay: Payer: Medicare Other

## 2017-05-09 ENCOUNTER — Inpatient Hospital Stay: Payer: Medicare Other | Attending: Hematology & Oncology | Admitting: Family

## 2017-05-09 ENCOUNTER — Other Ambulatory Visit: Payer: Self-pay

## 2017-05-09 ENCOUNTER — Other Ambulatory Visit: Payer: Self-pay | Admitting: Family

## 2017-05-09 ENCOUNTER — Encounter: Payer: Self-pay | Admitting: Family

## 2017-05-09 VITALS — BP 120/64 | HR 87 | Temp 98.4°F | Resp 18 | Wt 191.0 lb

## 2017-05-09 DIAGNOSIS — Z5112 Encounter for antineoplastic immunotherapy: Secondary | ICD-10-CM | POA: Insufficient documentation

## 2017-05-09 DIAGNOSIS — C9000 Multiple myeloma not having achieved remission: Secondary | ICD-10-CM

## 2017-05-09 DIAGNOSIS — D500 Iron deficiency anemia secondary to blood loss (chronic): Secondary | ICD-10-CM

## 2017-05-09 DIAGNOSIS — C541 Malignant neoplasm of endometrium: Secondary | ICD-10-CM | POA: Diagnosis not present

## 2017-05-09 DIAGNOSIS — Z79899 Other long term (current) drug therapy: Secondary | ICD-10-CM | POA: Diagnosis not present

## 2017-05-09 DIAGNOSIS — Z79811 Long term (current) use of aromatase inhibitors: Secondary | ICD-10-CM | POA: Diagnosis not present

## 2017-05-09 DIAGNOSIS — C9002 Multiple myeloma in relapse: Secondary | ICD-10-CM

## 2017-05-09 DIAGNOSIS — Z7982 Long term (current) use of aspirin: Secondary | ICD-10-CM | POA: Diagnosis not present

## 2017-05-09 DIAGNOSIS — Z923 Personal history of irradiation: Secondary | ICD-10-CM | POA: Diagnosis not present

## 2017-05-09 DIAGNOSIS — C9001 Multiple myeloma in remission: Secondary | ICD-10-CM

## 2017-05-09 LAB — CMP (CANCER CENTER ONLY)
ALT: 15 U/L (ref 10–47)
AST: 19 U/L (ref 11–38)
Albumin: 3.6 g/dL (ref 3.5–5.0)
Alkaline Phosphatase: 46 U/L (ref 26–84)
Anion gap: 8 (ref 5–15)
BUN: 17 mg/dL (ref 7–22)
CO2: 27 mmol/L (ref 18–33)
Calcium: 9 mg/dL (ref 8.0–10.3)
Chloride: 107 mmol/L (ref 98–108)
Creatinine: 0.8 mg/dL (ref 0.60–1.20)
Glucose, Bld: 95 mg/dL (ref 73–118)
Potassium: 3.1 mmol/L — ABNORMAL LOW (ref 3.3–4.7)
Sodium: 142 mmol/L (ref 128–145)
Total Bilirubin: 0.5 mg/dL (ref 0.2–1.6)
Total Protein: 6.3 g/dL — ABNORMAL LOW (ref 6.4–8.1)

## 2017-05-09 LAB — IRON AND TIBC
Iron: 39 ug/dL — ABNORMAL LOW (ref 41–142)
Saturation Ratios: 10 % — ABNORMAL LOW (ref 21–57)
TIBC: 388 ug/dL (ref 236–444)
UIBC: 349 ug/dL

## 2017-05-09 LAB — CBC WITH DIFFERENTIAL (CANCER CENTER ONLY)
Basophils Absolute: 0.1 10*3/uL (ref 0.0–0.1)
Basophils Relative: 2 %
Eosinophils Absolute: 0.2 10*3/uL (ref 0.0–0.5)
Eosinophils Relative: 5 %
HCT: 31 % — ABNORMAL LOW (ref 34.8–46.6)
Hemoglobin: 10.1 g/dL — ABNORMAL LOW (ref 11.6–15.9)
Lymphocytes Relative: 16 %
Lymphs Abs: 0.7 10*3/uL — ABNORMAL LOW (ref 0.9–3.3)
MCH: 26.6 pg (ref 26.0–34.0)
MCHC: 32.6 g/dL (ref 32.0–36.0)
MCV: 81.8 fL (ref 81.0–101.0)
Monocytes Absolute: 0.7 10*3/uL (ref 0.1–0.9)
Monocytes Relative: 17 %
Neutro Abs: 2.6 10*3/uL (ref 1.5–6.5)
Neutrophils Relative %: 60 %
Platelet Count: 257 10*3/uL (ref 145–400)
RBC: 3.79 MIL/uL (ref 3.70–5.32)
RDW: 19.7 % — ABNORMAL HIGH (ref 11.1–15.7)
WBC Count: 4.3 10*3/uL (ref 3.9–10.0)

## 2017-05-09 LAB — FERRITIN: Ferritin: 86 ng/mL (ref 9–269)

## 2017-05-09 MED ORDER — DEXAMETHASONE SODIUM PHOSPHATE 10 MG/ML IJ SOLN
INTRAMUSCULAR | Status: AC
Start: 2017-05-09 — End: 2017-05-09
  Filled 2017-05-09: qty 1

## 2017-05-09 MED ORDER — ALBUTEROL SULFATE (2.5 MG/3ML) 0.083% IN NEBU
2.5000 mg | INHALATION_SOLUTION | Freq: Once | RESPIRATORY_TRACT | Status: DC | PRN
  Filled 2017-05-09: qty 3

## 2017-05-09 MED ORDER — EPINEPHRINE PF 1 MG/ML IJ SOLN: 0.5000 mg | Freq: Once | INTRAMUSCULAR | Status: DC | PRN

## 2017-05-09 MED ORDER — EPINEPHRINE PF 1 MG/10ML IJ SOSY
0.2500 mg | PREFILLED_SYRINGE | Freq: Once | INTRAMUSCULAR | Status: DC | PRN
  Filled 2017-05-09: qty 10

## 2017-05-09 MED ORDER — DIPHENHYDRAMINE HCL 50 MG/ML IJ SOLN: 25.0000 mg | Freq: Once | INTRAMUSCULAR | Status: DC | PRN

## 2017-05-09 MED ORDER — HEPARIN SOD (PORK) LOCK FLUSH 100 UNIT/ML IV SOLN
500.0000 [IU] | Freq: Once | INTRAVENOUS | Status: AC | PRN
  Administered 2017-05-09: 500 [IU]
  Filled 2017-05-09: qty 5

## 2017-05-09 MED ORDER — SODIUM CHLORIDE 0.9 % IV SOLN: Freq: Once | INTRAVENOUS | Status: DC

## 2017-05-09 MED ORDER — METHYLPREDNISOLONE SODIUM SUCC 125 MG IJ SOLR
125.0000 mg | Freq: Once | INTRAMUSCULAR | Status: DC | PRN
Start: 2017-05-09 — End: 2017-05-09

## 2017-05-09 MED ORDER — SODIUM CHLORIDE 0.9 % IV SOLN: Freq: Once | INTRAVENOUS | Status: DC | PRN

## 2017-05-09 MED ORDER — DEXAMETHASONE SODIUM PHOSPHATE 10 MG/ML IJ SOLN
10.0000 mg | Freq: Once | INTRAMUSCULAR | Status: AC
  Administered 2017-05-09: 10 mg via INTRAVENOUS

## 2017-05-09 MED ORDER — DIPHENHYDRAMINE HCL 50 MG/ML IJ SOLN
50.0000 mg | Freq: Once | INTRAMUSCULAR | Status: DC | PRN
Start: 2017-05-09 — End: 2017-05-09

## 2017-05-09 MED ORDER — EPINEPHRINE PF 1 MG/10ML IJ SOSY
0.2500 mg | PREFILLED_SYRINGE | Freq: Once | INTRAMUSCULAR | Status: DC | PRN
Start: 2017-05-09 — End: 2017-05-09
  Filled 2017-05-09: qty 10

## 2017-05-09 MED ORDER — SODIUM CHLORIDE 0.9% FLUSH
10.0000 mL | INTRAVENOUS | Status: DC | PRN
  Administered 2017-05-09: 10 mL
  Filled 2017-05-09: qty 10

## 2017-05-09 MED ORDER — FAMOTIDINE IN NACL 20-0.9 MG/50ML-% IV SOLN: 20.0000 mg | Freq: Once | INTRAVENOUS | Status: DC | PRN

## 2017-05-09 MED ORDER — SODIUM CHLORIDE 0.9 % IV SOLN
Freq: Once | INTRAVENOUS | Status: AC
  Administered 2017-05-09: 12:00:00 via INTRAVENOUS

## 2017-05-09 MED ORDER — SODIUM CHLORIDE 0.9% FLUSH
3.0000 mL | INTRAVENOUS | Status: DC | PRN
  Filled 2017-05-09: qty 10

## 2017-05-09 MED ORDER — DEXTROSE 5 % IV SOLN
56.0000 mg/m2 | Freq: Once | INTRAVENOUS | Status: AC
  Administered 2017-05-09: 112 mg via INTRAVENOUS
  Filled 2017-05-09: qty 56

## 2017-05-09 MED ORDER — DEXAMETHASONE SODIUM PHOSPHATE 10 MG/ML IJ SOLN
INTRAMUSCULAR | Status: AC
  Filled 2017-05-09: qty 1

## 2017-05-09 NOTE — Progress Notes (Signed)
Hematology and Oncology Follow Up Visit  Emily Greer [id number] 09-Sep-1950 67 y.o. 05/09/2017   Principle Diagnosis:  Recurrent lambda light chain myeloma History of recurrent endometrial carcinoma  Past Therapy: Status post second autologous stem cell transplant on 07/24/2014 Maintenance therapy with Pomalidomide/every 2 week Velcade - d/c'ed Xgeva 120 mg subcutaneous every 3 months - next dose in April 2019 Radiation therapy for endometrial recurrence - completed 04/20/2015  Current Therapy:   Pomalyst/Kyprolis 70mg /m2 IV q 2 weeks -s/p cycle 6 Femara 2.5mg  po q day   Interim History:  Emily Greer is here today for follow-up and treatment. She is doing quite well and has no complaints at this time.  She follows up again with her oncology gynecologist in December and her regular gynecology visit in the next few months.  She has had no episodes of bleeding, no bruising or petechiae. No lymphadenopathy found on exam.  She verbalized that she is doing well on Pomalyst and is taking as prescribed.  She is taking her Femara as prescribed as well and only has the occasional hot flash/night sweat which she describes as mild and tolerable.  No fever, chills, n/v, cough, rash, dizziness, SOB, chest pain, palpitations, abdominal pain or changes in bowel or bladder habits.  She is careful to avoid certain foods that give her diarrhea.  No swelling, tenderness, numbness or tingling in her extremities at this time.  She has maintained a good appetite and is staying well hydrated. Her weight is stable.   ECOG Performance Status: 1 - Symptomatic but completely ambulatory  Medications:  Allergies as of 05/09/2017      Reactions   Codeine Nausea Only      Medication List        Accurate as of 05/09/17 11:24 AM. Always use your most recent med list.          acyclovir 400 MG tablet Commonly known as:  ZOVIRAX TAKE 1 TABLET(400 MG) BY MOUTH DAILY   albuterol 108 (90 Base)  MCG/ACT inhaler Commonly known as:  PROVENTIL HFA;VENTOLIN HFA Inhale 2 puffs into the lungs every 6 (six) hours as needed for wheezing or shortness of breath. 2 puffs 3 times daily x 5 days then every 6 hours as needed.   amLODipine 10 MG tablet Commonly known as:  NORVASC TAKE 1 TABLET BY MOUTH DAILY--- takes in am   aspirin EC 81 MG tablet Take 81 mg by mouth 2 (two) times daily.   granisetron 3.1 MG/24HR Commonly known as:  SANCUSO Apply to skin starting 24 hours before chemotherapy. Remove after 7 days.   letrozole 2.5 MG tablet Commonly known as:  FEMARA Take 1 tablet (2.5 mg total) by mouth daily.   lidocaine-prilocaine cream Commonly known as:  EMLA Apply to affected area once   loperamide 2 MG capsule Commonly known as:  IMODIUM Take by mouth as needed for diarrhea or loose stools. Reported on 08/19/2015   loratadine 10 MG tablet Commonly known as:  CLARITIN Take 10 mg by mouth every morning. Reported on 08/19/2015   NASACORT ALLERGY 24HR 55 MCG/ACT Aero nasal inhaler Generic drug:  triamcinolone Place 2 sprays into the nose daily.   NOREL AD 4-10-325 MG Tabs Generic drug:  Chlorphen-PE-Acetaminophen Take by mouth.   ondansetron 8 MG tablet Commonly known as:  ZOFRAN Take 1 tablet (8 mg total) by mouth 2 (two) times daily as needed (Nausea or vomiting).   pomalidomide 1 MG capsule Commonly known as:  POMALYST TAKE 1 CAPSULE  BY MOUTH ONCE DAILY FOR 21 DAYS ON AND 7 DAYS OFF. [id number]   potassium chloride SA 20 MEQ tablet Commonly known as:  K-DUR,KLOR-CON TAKE 1 TABLET(20 MEQ) BY MOUTH TWICE DAILY   prochlorperazine 10 MG tablet Commonly known as:  COMPAZINE Take 1 tablet (10 mg total) by mouth every 6 (six) hours as needed (Nausea or vomiting).   triamterene-hydrochlorothiazide 37.5-25 MG tablet Commonly known as:  MAXZIDE-25 TK 1 T PO QD   Vitamin D3 2000 units Tabs Take 2 tablets by mouth daily.       Allergies:  Allergies  Allergen  Reactions  . Codeine Nausea Only    Past Medical History, Surgical history, Social history, and Family History were reviewed and updated.  Review of Systems: All other 10 point review of systems is negative.   Physical Exam:  weight is 191 lb (86.6 kg). Her oral temperature is 98.4 F (36.9 C). Her blood pressure is 120/64 and her pulse is 87. Her respiration is 18 and oxygen saturation is 99%.   Wt Readings from Last 3 Encounters:  05/09/17 191 lb (86.6 kg)  04/11/17 189 lb 8 oz (86 kg)  02/05/17 190 lb (86.2 kg)    Ocular: Sclerae unicteric, pupils equal, round and reactive to light Ear-nose-throat: Oropharynx clear, dentition fair Lymphatic: No cervical, supraclavicular or axillary adenopathy Lungs no rales or rhonchi, good excursion bilaterally Heart regular rate and rhythm, no murmur appreciated Abd soft, nontender, positive bowel sounds, no liver or spleen tip palpated on exam, no fluid wave  MSK no focal spinal tenderness, no joint edema Neuro: non-focal, well-oriented, appropriate affect Breasts: Deferred   Lab Results  Component Value Date   WBC 4.3 05/09/2017   HGB 9.8 (L) 01/10/2017   HCT 31.0 (L) 05/09/2017   MCV 81.8 05/09/2017   PLT 257 05/09/2017   Lab Results  Component Value Date   FERRITIN 96 04/11/2017   IRON 66 04/11/2017   TIBC 410 04/11/2017   UIBC 344 04/11/2017   IRONPCTSAT 16 (L) 04/11/2017   Lab Results  Component Value Date   RETICCTPCT 0.6 12/31/2014   RBC 3.79 05/09/2017   Lab Results  Component Value Date   KPAFRELGTCHN 5.3 04/11/2017   LAMBDASER 20.1 04/11/2017   KAPLAMBRATIO 0.26 04/11/2017   Lab Results  Component Value Date   IGGSERUM 392 (L) 04/11/2017   IGA 29 (L) 04/11/2017   IGMSERUM 12 (L) 04/11/2017   Lab Results  Component Value Date   TOTALPROTELP 6.2 04/11/2017   ALBUMINELP 3.7 04/11/2017   A1GS 0.2 04/11/2017   A2GS 0.8 04/11/2017   BETS 1.1 04/11/2017   BETA2SER 0.4 11/23/2014   GAMS 0.3 (L) 04/11/2017     MSPIKE Not Observed 04/11/2017   SPEI Comment 04/11/2017     Chemistry      Component Value Date/Time   NA 139 04/25/2017 1013   NA 141 01/10/2017 1115   NA 140 06/21/2016 0918   K 3.4 04/25/2017 1013   K 4.0 01/10/2017 1115   K 4.3 06/21/2016 0918   CL 107 04/25/2017 1013   CL 106 01/10/2017 1115   CO2 27 04/25/2017 1013   CO2 27 01/10/2017 1115   CO2 20 (L) 06/21/2016 0918   BUN 20 04/25/2017 1013   BUN 15 01/10/2017 1115   BUN 15.8 06/21/2016 0918   CREATININE 1.00 04/25/2017 1013   CREATININE 1.0 01/10/2017 1115   CREATININE 0.8 06/21/2016 0918      Component Value Date/Time   CALCIUM 9.6  04/25/2017 1013   CALCIUM 9.5 01/10/2017 1115   CALCIUM 9.4 06/21/2016 0918   ALKPHOS 45 04/25/2017 1013   ALKPHOS 40 01/10/2017 1115   ALKPHOS 66 06/21/2016 0918   AST 17 04/25/2017 1013   AST 17 06/21/2016 0918   ALT 21 04/25/2017 1013   ALT 19 01/10/2017 1115   ALT 37 06/21/2016 0918   BILITOT 0.5 04/25/2017 1013   BILITOT 0.32 06/21/2016 0918      Impression and Plan: Ms. Budlong is a very pleasant 67 yo African American female with recurrent lambda light chain myeloma and also localized recurrent endometrial cancer (followed by gynecology oncologist).  She is doing well and tolerating treatment nicely. We will proceed with treatment today as planned.  She will continue her same regimen with Pomalyst.  We will plan to see her back in another month for follow-up with treatment every 2 weeks.  She will contact our office with any questions or concerns. We can certainly see her sooner if need be.   Emeline Gins, NP 4/10/201911:24 AM

## 2017-05-09 NOTE — Patient Instructions (Signed)
Carfilzomib injection What is this medicine? CARFILZOMIB (kar FILZ oh mib) targets a specific protein within cancer cells and stops the cancer cells from growing. It is used to treat multiple myeloma. This medicine may be used for other purposes; ask your health care provider or pharmacist if you have questions. COMMON BRAND NAME(S): KYPROLIS What should I tell my health care provider before I take this medicine? They need to know if you have any of these conditions: -heart disease -history of blood clots -irregular heartbeat -kidney disease -liver disease -lung or breathing disease -an unusual or allergic reaction to carfilzomib, or other medicines, foods, dyes, or preservatives -pregnant or trying to get pregnant -breast-feeding How should I use this medicine? This medicine is for injection or infusion into a vein. It is given by a health care professional in a hospital or clinic setting. Talk to your pediatrician regarding the use of this medicine in children. Special care may be needed. Overdosage: If you think you have taken too much of this medicine contact a poison control center or emergency room at once. NOTE: This medicine is only for you. Do not share this medicine with others. What if I miss a dose? It is important not to miss your dose. Call your doctor or health care professional if you are unable to keep an appointment. What may interact with this medicine? Interactions are not expected. Give your health care provider a list of all the medicines, herbs, non-prescription drugs, or dietary supplements you use. Also tell them if you smoke, drink alcohol, or use illegal drugs. Some items may interact with your medicine. This list may not describe all possible interactions. Give your health care provider a list of all the medicines, herbs, non-prescription drugs, or dietary supplements you use. Also tell them if you smoke, drink alcohol, or use illegal drugs. Some items may  interact with your medicine. What should I watch for while using this medicine? Your condition will be monitored carefully while you are receiving this medicine. Report any side effects. Continue your course of treatment even though you feel ill unless your doctor tells you to stop. You may need blood work done while you are taking this medicine. Do not become pregnant while taking this medicine or for at least 30 days after stopping it. Women should inform their doctor if they wish to become pregnant or think they might be pregnant. There is a potential for serious side effects to an unborn child. Men should not father a child while taking this medicine and for 90 days after stopping it. Talk to your health care professional or pharmacist for more information. Do not breast-feed an infant while taking this medicine. Check with your doctor or health care professional if you get an attack of severe diarrhea, nausea and vomiting, or if you sweat a lot. The loss of too much body fluid can make it dangerous for you to take this medicine. You may get dizzy. Do not drive, use machinery, or do anything that needs mental alertness until you know how this medicine affects you. Do not stand or sit up quickly, especially if you are an older patient. This reduces the risk of dizzy or fainting spells. What side effects may I notice from receiving this medicine? Side effects that you should report to your doctor or health care professional as soon as possible: -allergic reactions like skin rash, itching or hives, swelling of the face, lips, or tongue -confusion -dizziness -feeling faint or lightheaded -fever or chills -  palpitations -seizures -signs and symptoms of bleeding such as bloody or black, tarry stools; red or dark-brown urine; spitting up blood or brown material that looks like coffee grounds; red spots on the skin; unusual bruising or bleeding including from the eye, gums, or nose -signs and symptoms of  a blood clot such as breathing problems; changes in vision; chest pain; severe, sudden headache; pain, swelling, warmth in the leg; trouble speaking; sudden numbness or weakness of the face, arm or leg -signs and symptoms of kidney injury like trouble passing urine or change in the amount of urine -signs and symptoms of liver injury like dark yellow or brown urine; general ill feeling or flu-like symptoms; light-colored stools; loss of appetite; nausea; right upper belly pain; unusually weak or tired; yellowing of the eyes or skin Side effects that usually do not require medical attention (report to your doctor or health care professional if they continue or are bothersome): -back pain -cough -diarrhea -headache -muscle cramps -vomiting This list may not describe all possible side effects. Call your doctor for medical advice about side effects. You may report side effects to FDA at 1-800-FDA-1088. Where should I keep my medicine? This drug is given in a hospital or clinic and will not be stored at home. NOTE: This sheet is a summary. It may not cover all possible information. If you have questions about this medicine, talk to your doctor, pharmacist, or health care provider.  2018 Elsevier/Gold Standard (2015-02-18 13:39:23)  

## 2017-05-10 LAB — KAPPA/LAMBDA LIGHT CHAINS
Kappa free light chain: 6.5 mg/L (ref 3.3–19.4)
Kappa, lambda light chain ratio: 0.26 (ref 0.26–1.65)
Lambda free light chains: 25.3 mg/L (ref 5.7–26.3)

## 2017-05-10 LAB — IGG, IGA, IGM
IgA: 27 mg/dL — ABNORMAL LOW (ref 87–352)
IgG (Immunoglobin G), Serum: 423 mg/dL — ABNORMAL LOW (ref 700–1600)
IgM (Immunoglobulin M), Srm: 11 mg/dL — ABNORMAL LOW (ref 26–217)

## 2017-05-15 ENCOUNTER — Other Ambulatory Visit: Payer: Self-pay | Admitting: *Deleted

## 2017-05-15 LAB — IMMUNOFIXATION REFLEX, SERUM
IgA: 27 mg/dL — ABNORMAL LOW (ref 87–352)
IgG (Immunoglobin G), Serum: 436 mg/dL — ABNORMAL LOW (ref 700–1600)
IgM (Immunoglobulin M), Srm: 10 mg/dL — ABNORMAL LOW (ref 26–217)

## 2017-05-15 LAB — PROTEIN ELECTROPHORESIS, SERUM, WITH REFLEX
A/G Ratio: 1.6 (ref 0.7–1.7)
Albumin ELP: 3.7 g/dL (ref 2.9–4.4)
Alpha-1-Globulin: 0.2 g/dL (ref 0.0–0.4)
Alpha-2-Globulin: 0.7 g/dL (ref 0.4–1.0)
Beta Globulin: 1 g/dL (ref 0.7–1.3)
Gamma Globulin: 0.4 g/dL (ref 0.4–1.8)
Globulin, Total: 2.3 g/dL (ref 2.2–3.9)
SPEP Interpretation: 0
Total Protein ELP: 6 g/dL (ref 6.0–8.5)

## 2017-05-15 MED ORDER — POMALIDOMIDE 1 MG PO CAPS: ORAL_CAPSULE | ORAL | 0 refills | Status: DC

## 2017-05-21 ENCOUNTER — Other Ambulatory Visit: Payer: Self-pay | Admitting: Hematology & Oncology

## 2017-05-21 DIAGNOSIS — C9000 Multiple myeloma not having achieved remission: Secondary | ICD-10-CM

## 2017-05-23 ENCOUNTER — Ambulatory Visit: Payer: BC Managed Care – PPO

## 2017-05-23 ENCOUNTER — Other Ambulatory Visit: Payer: BC Managed Care – PPO

## 2017-05-25 ENCOUNTER — Inpatient Hospital Stay: Payer: Medicare Other

## 2017-05-25 VITALS — BP 137/77 | HR 85 | Temp 98.4°F | Resp 18

## 2017-05-25 DIAGNOSIS — Z79811 Long term (current) use of aromatase inhibitors: Secondary | ICD-10-CM | POA: Diagnosis not present

## 2017-05-25 DIAGNOSIS — C9000 Multiple myeloma not having achieved remission: Secondary | ICD-10-CM

## 2017-05-25 DIAGNOSIS — C541 Malignant neoplasm of endometrium: Secondary | ICD-10-CM

## 2017-05-25 DIAGNOSIS — Z7982 Long term (current) use of aspirin: Secondary | ICD-10-CM | POA: Diagnosis not present

## 2017-05-25 DIAGNOSIS — Z79899 Other long term (current) drug therapy: Secondary | ICD-10-CM | POA: Diagnosis not present

## 2017-05-25 DIAGNOSIS — C9002 Multiple myeloma in relapse: Secondary | ICD-10-CM | POA: Diagnosis not present

## 2017-05-25 DIAGNOSIS — Z5112 Encounter for antineoplastic immunotherapy: Secondary | ICD-10-CM | POA: Diagnosis not present

## 2017-05-25 DIAGNOSIS — C9001 Multiple myeloma in remission: Secondary | ICD-10-CM

## 2017-05-25 LAB — CBC WITH DIFFERENTIAL (CANCER CENTER ONLY)
Basophils Absolute: 0.1 10*3/uL (ref 0.0–0.1)
Basophils Relative: 2 %
Eosinophils Absolute: 0.2 10*3/uL (ref 0.0–0.5)
Eosinophils Relative: 6 %
HCT: 34.1 % — ABNORMAL LOW (ref 34.8–46.6)
Hemoglobin: 11.2 g/dL — ABNORMAL LOW (ref 11.6–15.9)
Lymphocytes Relative: 24 %
Lymphs Abs: 1 10*3/uL (ref 0.9–3.3)
MCH: 27.1 pg (ref 26.0–34.0)
MCHC: 32.8 g/dL (ref 32.0–36.0)
MCV: 82.4 fL (ref 81.0–101.0)
Monocytes Absolute: 0.7 10*3/uL (ref 0.1–0.9)
Monocytes Relative: 18 %
Neutro Abs: 2.1 10*3/uL (ref 1.5–6.5)
Neutrophils Relative %: 50 %
Platelet Count: 273 10*3/uL (ref 145–400)
RBC: 4.14 MIL/uL (ref 3.70–5.32)
RDW: 19.4 % — ABNORMAL HIGH (ref 11.1–15.7)
WBC Count: 4.2 10*3/uL (ref 3.9–10.0)

## 2017-05-25 LAB — CMP (CANCER CENTER ONLY)
ALT: 25 U/L (ref 10–47)
AST: 22 U/L (ref 11–38)
Albumin: 4.1 g/dL (ref 3.5–5.0)
Alkaline Phosphatase: 51 U/L (ref 26–84)
Anion gap: 10 (ref 5–15)
BUN: 19 mg/dL (ref 7–22)
CO2: 27 mmol/L (ref 18–33)
Calcium: 9.9 mg/dL (ref 8.0–10.3)
Chloride: 105 mmol/L (ref 98–108)
Creatinine: 1.2 mg/dL (ref 0.60–1.20)
Glucose, Bld: 100 mg/dL (ref 73–118)
Potassium: 3.4 mmol/L (ref 3.3–4.7)
Sodium: 142 mmol/L (ref 128–145)
Total Bilirubin: 0.8 mg/dL (ref 0.2–1.6)
Total Protein: 6.9 g/dL (ref 6.4–8.1)

## 2017-05-25 MED ORDER — SODIUM CHLORIDE 0.9 % IV SOLN
Freq: Once | INTRAVENOUS | Status: AC
  Administered 2017-05-25: 14:00:00 via INTRAVENOUS

## 2017-05-25 MED ORDER — SODIUM CHLORIDE 0.9 % IV SOLN
Freq: Once | INTRAVENOUS | Status: AC
  Administered 2017-05-25: 15:00:00 via INTRAVENOUS
  Filled 2017-05-25: qty 8

## 2017-05-25 MED ORDER — SODIUM CHLORIDE 0.9 % IV SOLN
Freq: Once | INTRAVENOUS | Status: AC
Start: 2017-05-25 — End: 2017-05-25
  Administered 2017-05-25: 14:00:00 via INTRAVENOUS

## 2017-05-25 MED ORDER — DEXTROSE 5 % IV SOLN
55.0000 mg/m2 | Freq: Once | INTRAVENOUS | Status: AC
  Administered 2017-05-25: 110 mg via INTRAVENOUS
  Filled 2017-05-25: qty 55

## 2017-05-25 MED ORDER — DENOSUMAB 120 MG/1.7ML ~~LOC~~ SOLN
120.0000 mg | Freq: Once | SUBCUTANEOUS | Status: AC
  Administered 2017-05-25: 120 mg via SUBCUTANEOUS

## 2017-05-25 MED ORDER — DENOSUMAB 120 MG/1.7ML ~~LOC~~ SOLN
SUBCUTANEOUS | Status: AC
  Filled 2017-05-25: qty 1.7

## 2017-05-25 MED ORDER — SODIUM CHLORIDE 0.9% FLUSH
10.0000 mL | INTRAVENOUS | Status: DC | PRN
  Administered 2017-05-25: 10 mL
  Filled 2017-05-25: qty 10

## 2017-05-25 MED ORDER — HEPARIN SOD (PORK) LOCK FLUSH 100 UNIT/ML IV SOLN
500.0000 [IU] | Freq: Once | INTRAVENOUS | Status: AC | PRN
  Administered 2017-05-25: 500 [IU]
  Filled 2017-05-25: qty 5

## 2017-05-25 NOTE — Patient Instructions (Signed)
[facility] Discharge Instructions for Patients Receiving Chemotherapy  Today you received the following chemotherapy agents:  Kyprolis  To help prevent nausea and vomiting after your treatment, we encourage you to take your nausea medication as ordered per MD.    If you develop nausea and vomiting that is not controlled by your nausea medication, call the clinic.   BELOW ARE SYMPTOMS THAT SHOULD BE REPORTED IMMEDIATELY:  *FEVER GREATER THAN 100.5 F  *CHILLS WITH OR WITHOUT FEVER  NAUSEA AND VOMITING THAT IS NOT CONTROLLED WITH YOUR NAUSEA MEDICATION  *UNUSUAL SHORTNESS OF BREATH  *UNUSUAL BRUISING OR BLEEDING  TENDERNESS IN MOUTH AND THROAT WITH OR WITHOUT PRESENCE OF ULCERS  *URINARY PROBLEMS  *BOWEL PROBLEMS  UNUSUAL RASH Items with * indicate a potential emergency and should be followed up as soon as possible.  Feel free to call the clinic should you have any questions or concerns. The clinic phone number is [phone].  Please show the CHEMO ALERT CARD at check-in to the Emergency Department and triage nurse.   

## 2017-06-13 ENCOUNTER — Encounter: Payer: Self-pay | Admitting: Family

## 2017-06-13 ENCOUNTER — Inpatient Hospital Stay: Payer: Medicare Other

## 2017-06-13 ENCOUNTER — Inpatient Hospital Stay: Payer: Medicare Other | Attending: Hematology & Oncology | Admitting: Family

## 2017-06-13 ENCOUNTER — Other Ambulatory Visit: Payer: Self-pay

## 2017-06-13 VITALS — BP 137/69 | HR 97 | Temp 98.3°F | Resp 18 | Wt 194.0 lb

## 2017-06-13 DIAGNOSIS — Z5112 Encounter for antineoplastic immunotherapy: Secondary | ICD-10-CM | POA: Insufficient documentation

## 2017-06-13 DIAGNOSIS — C9000 Multiple myeloma not having achieved remission: Secondary | ICD-10-CM | POA: Diagnosis not present

## 2017-06-13 DIAGNOSIS — Z923 Personal history of irradiation: Secondary | ICD-10-CM | POA: Insufficient documentation

## 2017-06-13 DIAGNOSIS — Z7982 Long term (current) use of aspirin: Secondary | ICD-10-CM | POA: Diagnosis not present

## 2017-06-13 DIAGNOSIS — Z800 Family history of malignant neoplasm of digestive organs: Secondary | ICD-10-CM | POA: Insufficient documentation

## 2017-06-13 DIAGNOSIS — D500 Iron deficiency anemia secondary to blood loss (chronic): Secondary | ICD-10-CM

## 2017-06-13 DIAGNOSIS — Z79811 Long term (current) use of aromatase inhibitors: Secondary | ICD-10-CM | POA: Diagnosis not present

## 2017-06-13 DIAGNOSIS — C541 Malignant neoplasm of endometrium: Secondary | ICD-10-CM | POA: Diagnosis not present

## 2017-06-13 DIAGNOSIS — Z79899 Other long term (current) drug therapy: Secondary | ICD-10-CM | POA: Insufficient documentation

## 2017-06-13 DIAGNOSIS — D508 Other iron deficiency anemias: Secondary | ICD-10-CM | POA: Insufficient documentation

## 2017-06-13 LAB — CMP (CANCER CENTER ONLY)
ALT: 22 U/L (ref 10–47)
AST: 20 U/L (ref 11–38)
Albumin: 3.8 g/dL (ref 3.5–5.0)
Alkaline Phosphatase: 43 U/L (ref 26–84)
Anion gap: 5 (ref 5–15)
BUN: 19 mg/dL (ref 7–22)
CO2: 27 mmol/L (ref 18–33)
Calcium: 9.4 mg/dL (ref 8.0–10.3)
Chloride: 106 mmol/L (ref 98–108)
Creatinine: 1.2 mg/dL (ref 0.60–1.20)
Glucose, Bld: 126 mg/dL — ABNORMAL HIGH (ref 73–118)
Potassium: 3.1 mmol/L — ABNORMAL LOW (ref 3.3–4.7)
Sodium: 138 mmol/L (ref 128–145)
Total Bilirubin: 0.7 mg/dL (ref 0.2–1.6)
Total Protein: 6.6 g/dL (ref 6.4–8.1)

## 2017-06-13 LAB — CBC WITH DIFFERENTIAL (CANCER CENTER ONLY)
Basophils Absolute: 0.1 10*3/uL (ref 0.0–0.1)
Basophils Relative: 3 %
Eosinophils Absolute: 0.2 10*3/uL (ref 0.0–0.5)
Eosinophils Relative: 5 %
HCT: 32.8 % — ABNORMAL LOW (ref 34.8–46.6)
Hemoglobin: 10.8 g/dL — ABNORMAL LOW (ref 11.6–15.9)
Lymphocytes Relative: 23 %
Lymphs Abs: 0.8 10*3/uL — ABNORMAL LOW (ref 0.9–3.3)
MCH: 27.3 pg (ref 26.0–34.0)
MCHC: 32.9 g/dL (ref 32.0–36.0)
MCV: 83 fL (ref 81.0–101.0)
Monocytes Absolute: 0.5 10*3/uL (ref 0.1–0.9)
Monocytes Relative: 15 %
Neutro Abs: 1.9 10*3/uL (ref 1.5–6.5)
Neutrophils Relative %: 54 %
Platelet Count: 284 10*3/uL (ref 145–400)
RBC: 3.95 MIL/uL (ref 3.70–5.32)
RDW: 17.9 % — ABNORMAL HIGH (ref 11.1–15.7)
WBC Count: 3.5 10*3/uL — ABNORMAL LOW (ref 3.9–10.0)

## 2017-06-13 MED ORDER — SODIUM CHLORIDE 0.9 % IV SOLN
Freq: Once | INTRAVENOUS | Status: AC
  Administered 2017-06-13: 12:00:00 via INTRAVENOUS

## 2017-06-13 MED ORDER — HEPARIN SOD (PORK) LOCK FLUSH 100 UNIT/ML IV SOLN
500.0000 [IU] | Freq: Once | INTRAVENOUS | Status: AC | PRN
  Administered 2017-06-13: 500 [IU]
  Filled 2017-06-13: qty 5

## 2017-06-13 MED ORDER — SODIUM CHLORIDE 0.9% FLUSH
10.0000 mL | INTRAVENOUS | Status: DC | PRN
  Administered 2017-06-13: 10 mL
  Filled 2017-06-13: qty 10

## 2017-06-13 MED ORDER — ONDANSETRON HCL 40 MG/20ML IJ SOLN
Freq: Once | INTRAMUSCULAR | Status: AC
  Administered 2017-06-13: 12:00:00 via INTRAVENOUS
  Filled 2017-06-13: qty 8

## 2017-06-13 MED ORDER — DEXTROSE 5 % IV SOLN
56.0000 mg/m2 | Freq: Once | INTRAVENOUS | Status: AC
  Administered 2017-06-13: 112 mg via INTRAVENOUS
  Filled 2017-06-13: qty 56

## 2017-06-13 NOTE — Patient Instructions (Signed)
[facility] Discharge Instructions for Patients Receiving Chemotherapy  Today you received the following chemotherapy agents:  Kyprolis  To help prevent nausea and vomiting after your treatment, we encourage you to take your nausea medication as prescribed.   If you develop nausea and vomiting that is not controlled by your nausea medication, call the clinic.   BELOW ARE SYMPTOMS THAT SHOULD BE REPORTED IMMEDIATELY:  *FEVER GREATER THAN 100.5 F  *CHILLS WITH OR WITHOUT FEVER  NAUSEA AND VOMITING THAT IS NOT CONTROLLED WITH YOUR NAUSEA MEDICATION  *UNUSUAL SHORTNESS OF BREATH  *UNUSUAL BRUISING OR BLEEDING  TENDERNESS IN MOUTH AND THROAT WITH OR WITHOUT PRESENCE OF ULCERS  *URINARY PROBLEMS  *BOWEL PROBLEMS  UNUSUAL RASH Items with * indicate a potential emergency and should be followed up as soon as possible.  Feel free to call the clinic should you have any questions or concerns. The clinic phone number is [phone].  Please show the CHEMO ALERT CARD at check-in to the Emergency Department and triage nurse.   

## 2017-06-13 NOTE — Progress Notes (Signed)
Hematology and Oncology Follow Up Visit  Emily Greer [id number] 1950/07/31 67 y.o. 06/13/2017   Principle Diagnosis:  Recurrent lambda light chain myeloma History of recurrent endometrial carcinoma  Past Therapy: Status post second autologous stem cell transplant on 07/24/2014 Maintenance therapy with Pomalidomide/every 2 week Velcade - d/c'ed Xgeva 120 mg subcutaneous every 3months -next dose inApril 2019 Radiation therapy for endometrial recurrence - completed 04/20/2015  Current Therapy:   Pomalyst/Kyprolis 70mg /m2 IV q 2 weeks -s/p cycle 6 Femara 2.5mg  po q day   Interim History:  Emily Greer is here today for follow-up and treatment. She continues to do well on Pomalyst as well as Femara, taking them as prescribed.  She has recently noticed blood in her stool and plans to call Dr. Matthias Hughs today for further work-up. Of note, her mother had colon cancer.  No other bleeding, no bruising or petechiae.  No lymphadenopathy noted on her exam.  No fever, chills, n/v, cough, rash, headache, vision changes, dizziness, SOB, chest pain, palpitations, abdominal pain or changes in bowel or bladder habits.  No swelling, tenderness, numbness or tingling in her extremities. No c/o pain.  She has a good appetite and is staying well hydrated. Her weight is stable.  She is walking for exercise.   ECOG Performance Status: 1 - Symptomatic but completely ambulatory  Medications:  Allergies as of 06/13/2017      Reactions   Codeine Nausea Only      Medication List        Accurate as of 06/13/17 11:35 AM. Always use your most recent med list.          acyclovir 400 MG tablet Commonly known as:  ZOVIRAX TAKE 1 TABLET(400 MG) BY MOUTH DAILY   albuterol 108 (90 Base) MCG/ACT inhaler Commonly known as:  PROVENTIL HFA;VENTOLIN HFA Inhale 2 puffs into the lungs every 6 (six) hours as needed for wheezing or shortness of breath. 2 puffs 3 times daily x 5 days then every 6 hours as  needed.   amLODipine 10 MG tablet Commonly known as:  NORVASC TAKE 1 TABLET BY MOUTH DAILY--- takes in am   aspirin EC 81 MG tablet Take 81 mg by mouth 2 (two) times daily.   granisetron 3.1 MG/24HR Commonly known as:  SANCUSO Apply to skin starting 24 hours before chemotherapy. Remove after 7 days.   letrozole 2.5 MG tablet Commonly known as:  FEMARA Take 1 tablet (2.5 mg total) by mouth daily.   lidocaine-prilocaine cream Commonly known as:  EMLA Apply to affected area once   loperamide 2 MG capsule Commonly known as:  IMODIUM Take by mouth as needed for diarrhea or loose stools. Reported on 08/19/2015   loratadine 10 MG tablet Commonly known as:  CLARITIN Take 10 mg by mouth every morning. Reported on 08/19/2015   NASACORT ALLERGY 24HR 55 MCG/ACT Aero nasal inhaler Generic drug:  triamcinolone Place 2 sprays into the nose daily.   NOREL AD 4-10-325 MG Tabs Generic drug:  Chlorphen-PE-Acetaminophen Take by mouth.   ondansetron 8 MG tablet Commonly known as:  ZOFRAN Take 1 tablet (8 mg total) by mouth 2 (two) times daily as needed (Nausea or vomiting).   pomalidomide 1 MG capsule Commonly known as:  POMALYST TAKE 1 CAPSULE BY MOUTH ONCE DAILY FOR 21 DAYS ON AND 7 DAYS OFF. [id number]   potassium chloride SA 20 MEQ tablet Commonly known as:  K-DUR,KLOR-CON TAKE 1 TABLET(20 MEQ) BY MOUTH TWICE DAILY   prochlorperazine 10 MG tablet Commonly  known as:  COMPAZINE Take 1 tablet (10 mg total) by mouth every 6 (six) hours as needed (Nausea or vomiting).   triamterene-hydrochlorothiazide 37.5-25 MG tablet Commonly known as:  MAXZIDE-25 TK 1 T PO QD   Vitamin D3 2000 units Tabs Take 2 tablets by mouth daily.       Allergies:  Allergies  Allergen Reactions  . Codeine Nausea Only    Past Medical History, Surgical history, Social history, and Family History were reviewed and updated.  Review of Systems: All other 10 point review of systems is negative.    Physical Exam:  vitals were not taken for this visit.   Wt Readings from Last 3 Encounters:  05/09/17 191 lb (86.6 kg)  04/11/17 189 lb 8 oz (86 kg)  02/05/17 190 lb (86.2 kg)    Ocular: Sclerae unicteric, pupils equal, round and reactive to light Ear-nose-throat: Oropharynx clear, dentition fair Lymphatic: No cervical, supraclavicular or axillary adenopathy Lungs no rales or rhonchi, good excursion bilaterally Heart regular rate and rhythm, no murmur appreciated Abd soft, nontender, positive bowel sounds, no liver or spleen tip palpated on exam, no fluid wave  MSK no focal spinal tenderness, no joint edema Neuro: non-focal, well-oriented, appropriate affect Breasts: Deferred   Lab Results  Component Value Date   WBC 4.2 05/25/2017   HGB 11.2 (L) 05/25/2017   HCT 34.1 (L) 05/25/2017   MCV 82.4 05/25/2017   PLT 273 05/25/2017   Lab Results  Component Value Date   FERRITIN 86 05/09/2017   IRON 39 (L) 05/09/2017   TIBC 388 05/09/2017   UIBC 349 05/09/2017   IRONPCTSAT 10 (L) 05/09/2017   Lab Results  Component Value Date   RETICCTPCT 0.6 12/31/2014   RBC 4.14 05/25/2017   Lab Results  Component Value Date   KPAFRELGTCHN 6.5 05/09/2017   LAMBDASER 25.3 05/09/2017   KAPLAMBRATIO 0.26 05/09/2017   Lab Results  Component Value Date   IGGSERUM 423 (L) 05/09/2017   IGGSERUM 436 (L) 05/09/2017   IGA 27 (L) 05/09/2017   IGA 27 (L) 05/09/2017   IGMSERUM 11 (L) 05/09/2017   IGMSERUM 10 (L) 05/09/2017   Lab Results  Component Value Date   TOTALPROTELP 6.0 05/09/2017   ALBUMINELP 3.7 05/09/2017   A1GS 0.2 05/09/2017   A2GS 0.7 05/09/2017   BETS 1.0 05/09/2017   BETA2SER 0.4 11/23/2014   GAMS 0.4 05/09/2017   MSPIKE Not Observed 05/09/2017   SPEI Comment 04/11/2017     Chemistry      Component Value Date/Time   NA 142 05/25/2017 1345   NA 141 01/10/2017 1115   NA 140 06/21/2016 0918   K 3.4 05/25/2017 1345   K 4.0 01/10/2017 1115   K 4.3 06/21/2016 0918    CL 105 05/25/2017 1345   CL 106 01/10/2017 1115   CO2 27 05/25/2017 1345   CO2 27 01/10/2017 1115   CO2 20 (L) 06/21/2016 0918   BUN 19 05/25/2017 1345   BUN 15 01/10/2017 1115   BUN 15.8 06/21/2016 0918   CREATININE 1.20 05/25/2017 1345   CREATININE 1.0 01/10/2017 1115   CREATININE 0.8 06/21/2016 0918      Component Value Date/Time   CALCIUM 9.9 05/25/2017 1345   CALCIUM 9.5 01/10/2017 1115   CALCIUM 9.4 06/21/2016 0918   ALKPHOS 51 05/25/2017 1345   ALKPHOS 40 01/10/2017 1115   ALKPHOS 66 06/21/2016 0918   AST 22 05/25/2017 1345   AST 17 06/21/2016 0918   ALT 25 05/25/2017 1345  ALT 19 01/10/2017 1115   ALT 37 06/21/2016 0918   BILITOT 0.8 05/25/2017 1345   BILITOT 0.32 06/21/2016 0918      Impression and Plan: Ms. Gera is a very pleasant 67 yo African American female with recurrent lambda light chain myeloma and also localized recurrent endometrial cancer (followed by gyn onc). She continues to do well but has recently had blood in her stool. She states that she will follow-up with Dr. Matthias Hughs today for further work-up.  We will proceed with Kyprolis treatment today as planned. She will stay on hr same regimen with Pomalyst.  She will get a new schedule today with treatment every 2 weeks and follow-up in 1 month.  She will contact our office with any questions or concerns. We can certainly see her sooner if need be.   Emeline Gins, NP 5/15/201911:35 AM

## 2017-06-13 NOTE — Patient Instructions (Signed)

## 2017-06-14 LAB — IRON AND TIBC
Iron: 57 ug/dL (ref 41–142)
Saturation Ratios: 14 % — ABNORMAL LOW (ref 21–57)
TIBC: 420 ug/dL (ref 236–444)
UIBC: 363 ug/dL

## 2017-06-14 LAB — IGG, IGA, IGM
IgA: 25 mg/dL — ABNORMAL LOW (ref 87–352)
IgG (Immunoglobin G), Serum: 439 mg/dL — ABNORMAL LOW (ref 700–1600)
IgM (Immunoglobulin M), Srm: 8 mg/dL — ABNORMAL LOW (ref 26–217)

## 2017-06-14 LAB — KAPPA/LAMBDA LIGHT CHAINS
Kappa free light chain: 4.9 mg/L (ref 3.3–19.4)
Kappa, lambda light chain ratio: 0.19 — ABNORMAL LOW (ref 0.26–1.65)
Lambda free light chains: 25.9 mg/L (ref 5.7–26.3)

## 2017-06-14 LAB — FERRITIN: Ferritin: 61 ng/mL (ref 9–269)

## 2017-06-15 LAB — PROTEIN ELECTROPHORESIS, SERUM
A/G Ratio: 1.9 — ABNORMAL HIGH (ref 0.7–1.7)
Albumin ELP: 4 g/dL (ref 2.9–4.4)
Alpha-1-Globulin: 0.2 g/dL (ref 0.0–0.4)
Alpha-2-Globulin: 0.7 g/dL (ref 0.4–1.0)
Beta Globulin: 1 g/dL (ref 0.7–1.3)
Gamma Globulin: 0.3 g/dL — ABNORMAL LOW (ref 0.4–1.8)
Globulin, Total: 2.1 g/dL — ABNORMAL LOW (ref 2.2–3.9)
Total Protein ELP: 6.1 g/dL (ref 6.0–8.5)

## 2017-06-20 ENCOUNTER — Other Ambulatory Visit: Payer: Self-pay | Admitting: *Deleted

## 2017-06-20 DIAGNOSIS — C9000 Multiple myeloma not having achieved remission: Secondary | ICD-10-CM

## 2017-06-20 MED ORDER — POMALIDOMIDE 1 MG PO CAPS: ORAL_CAPSULE | ORAL | 0 refills | Status: DC

## 2017-06-26 ENCOUNTER — Other Ambulatory Visit: Payer: Self-pay | Admitting: *Deleted

## 2017-06-26 DIAGNOSIS — C9000 Multiple myeloma not having achieved remission: Secondary | ICD-10-CM

## 2017-06-27 ENCOUNTER — Inpatient Hospital Stay: Payer: Medicare Other

## 2017-06-27 ENCOUNTER — Other Ambulatory Visit: Payer: Self-pay | Admitting: Family

## 2017-06-27 ENCOUNTER — Other Ambulatory Visit: Payer: Self-pay | Admitting: *Deleted

## 2017-06-27 VITALS — BP 120/68 | HR 92 | Temp 98.5°F | Resp 18

## 2017-06-27 DIAGNOSIS — C541 Malignant neoplasm of endometrium: Secondary | ICD-10-CM

## 2017-06-27 DIAGNOSIS — C9000 Multiple myeloma not having achieved remission: Secondary | ICD-10-CM

## 2017-06-27 DIAGNOSIS — D508 Other iron deficiency anemias: Secondary | ICD-10-CM

## 2017-06-27 DIAGNOSIS — Z5112 Encounter for antineoplastic immunotherapy: Secondary | ICD-10-CM | POA: Diagnosis not present

## 2017-06-27 DIAGNOSIS — C9001 Multiple myeloma in remission: Secondary | ICD-10-CM

## 2017-06-27 DIAGNOSIS — Z79811 Long term (current) use of aromatase inhibitors: Secondary | ICD-10-CM | POA: Diagnosis not present

## 2017-06-27 DIAGNOSIS — Z7982 Long term (current) use of aspirin: Secondary | ICD-10-CM | POA: Diagnosis not present

## 2017-06-27 LAB — CBC WITH DIFFERENTIAL (CANCER CENTER ONLY)
Basophils Absolute: 0.1 10*3/uL (ref 0.0–0.1)
Basophils Relative: 2 %
Eosinophils Absolute: 0.3 10*3/uL (ref 0.0–0.5)
Eosinophils Relative: 5 %
HCT: 34.8 % (ref 34.8–46.6)
Hemoglobin: 11.4 g/dL — ABNORMAL LOW (ref 11.6–15.9)
Lymphocytes Relative: 12 %
Lymphs Abs: 0.7 10*3/uL — ABNORMAL LOW (ref 0.9–3.3)
MCH: 27.2 pg (ref 26.0–34.0)
MCHC: 32.8 g/dL (ref 32.0–36.0)
MCV: 83.1 fL (ref 81.0–101.0)
Monocytes Absolute: 1 10*3/uL — ABNORMAL HIGH (ref 0.1–0.9)
Monocytes Relative: 17 %
Neutro Abs: 3.6 10*3/uL (ref 1.5–6.5)
Neutrophils Relative %: 64 %
Platelet Count: 275 10*3/uL (ref 145–400)
RBC: 4.19 MIL/uL (ref 3.70–5.32)
RDW: 16.8 % — ABNORMAL HIGH (ref 11.1–15.7)
WBC Count: 5.6 10*3/uL (ref 3.9–10.0)

## 2017-06-27 LAB — CMP (CANCER CENTER ONLY)
ALT: 24 U/L (ref 10–47)
AST: 19 U/L (ref 11–38)
Albumin: 3.9 g/dL (ref 3.5–5.0)
Alkaline Phosphatase: 44 U/L (ref 26–84)
Anion gap: 7 (ref 5–15)
BUN: 16 mg/dL (ref 7–22)
CO2: 30 mmol/L (ref 18–33)
Calcium: 9.8 mg/dL (ref 8.0–10.3)
Chloride: 107 mmol/L (ref 98–108)
Creatinine: 1.3 mg/dL — ABNORMAL HIGH (ref 0.60–1.20)
Glucose, Bld: 98 mg/dL (ref 73–118)
Potassium: 3.5 mmol/L (ref 3.3–4.7)
Sodium: 144 mmol/L (ref 128–145)
Total Bilirubin: 0.7 mg/dL (ref 0.2–1.6)
Total Protein: 6.8 g/dL (ref 6.4–8.1)

## 2017-06-27 MED ORDER — SODIUM CHLORIDE 0.9 % IV SOLN: Freq: Once | INTRAVENOUS | Status: AC

## 2017-06-27 MED ORDER — ACYCLOVIR 400 MG PO TABS: 400.0000 mg | ORAL_TABLET | Freq: Every day | ORAL | 3 refills | Status: DC

## 2017-06-27 MED ORDER — SODIUM CHLORIDE 0.9 % IV SOLN
Freq: Once | INTRAVENOUS | Status: AC
  Administered 2017-06-27: 12:00:00 via INTRAVENOUS

## 2017-06-27 MED ORDER — DEXTROSE 5 % IV SOLN
110.0000 mg | Freq: Once | INTRAVENOUS | Status: AC
  Administered 2017-06-27: 110 mg via INTRAVENOUS
  Filled 2017-06-27: qty 30

## 2017-06-27 MED ORDER — SODIUM CHLORIDE 0.9% FLUSH
10.0000 mL | INTRAVENOUS | Status: DC | PRN
  Administered 2017-06-27: 10 mL
  Filled 2017-06-27: qty 10

## 2017-06-27 MED ORDER — DEXAMETHASONE SODIUM PHOSPHATE 10 MG/ML IJ SOLN
10.0000 mg | Freq: Once | INTRAMUSCULAR | Status: AC
  Administered 2017-06-27: 10 mg via INTRAVENOUS

## 2017-06-27 MED ORDER — SODIUM CHLORIDE 0.9 % IV SOLN: Freq: Once | INTRAVENOUS | Status: DC

## 2017-06-27 MED ORDER — DEXAMETHASONE SODIUM PHOSPHATE 10 MG/ML IJ SOLN
INTRAMUSCULAR | Status: AC
  Filled 2017-06-27: qty 1

## 2017-06-27 MED ORDER — SODIUM CHLORIDE 0.9 % IV SOLN
510.0000 mg | Freq: Once | INTRAVENOUS | Status: AC
  Administered 2017-06-27: 510 mg via INTRAVENOUS
  Filled 2017-06-27: qty 17

## 2017-06-27 MED ORDER — HEPARIN SOD (PORK) LOCK FLUSH 100 UNIT/ML IV SOLN
500.0000 [IU] | Freq: Once | INTRAVENOUS | Status: AC | PRN
  Administered 2017-06-27: 500 [IU]
  Filled 2017-06-27: qty 5

## 2017-06-27 NOTE — Patient Instructions (Signed)
Implanted Port Insertion, Care After °This sheet gives you information about how to care for yourself after your procedure. Your health care provider may also give you more specific instructions. If you have problems or questions, contact your health care provider. °What can I expect after the procedure? °After your procedure, it is common to have: °· Discomfort at the port insertion site. °· Bruising on the skin over the port. This should improve over 3-4 days. ° °Follow these instructions at home: °Port care °· After your port is placed, you will get a manufacturer's information card. The card has information about your port. Keep this card with you at all times. °· Take care of the port as told by your health care provider. Ask your health care provider if you or a family member can get training for taking care of the port at home. A home health care nurse may also take care of the port. °· Make sure to remember what type of port you have. °Incision care °· Follow instructions from your health care provider about how to take care of your port insertion site. Make sure you: °? Wash your hands with soap and water before you change your bandage (dressing). If soap and water are not available, use hand sanitizer. °? Change your dressing as told by your health care provider. °? Leave stitches (sutures), skin glue, or adhesive strips in place. These skin closures may need to stay in place for 2 weeks or longer. If adhesive strip edges start to loosen and curl up, you may trim the loose edges. Do not remove adhesive strips completely unless your health care provider tells you to do that. °· Check your port insertion site every day for signs of infection. Check for: °? More redness, swelling, or pain. °? More fluid or blood. °? Warmth. °? Pus or a bad smell. °General instructions °· Do not take baths, swim, or use a hot tub until your health care provider approves. °· Do not lift anything that is heavier than 10 lb (4.5  kg) for a week, or as told by your health care provider. °· Ask your health care provider when it is okay to: °? Return to work or school. °? Resume usual physical activities or sports. °· Do not drive for 24 hours if you were given a medicine to help you relax (sedative). °· Take over-the-counter and prescription medicines only as told by your health care provider. °· Wear a medical alert bracelet in case of an emergency. This will tell any health care providers that you have a port. °· Keep all follow-up visits as told by your health care provider. This is important. °Contact a health care provider if: °· You cannot flush your port with saline as directed, or you cannot draw blood from the port. °· You have a fever or chills. °· You have more redness, swelling, or pain around your port insertion site. °· You have more fluid or blood coming from your port insertion site. °· Your port insertion site feels warm to the touch. °· You have pus or a bad smell coming from the port insertion site. °Get help right away if: °· You have chest pain or shortness of breath. °· You have bleeding from your port that you cannot control. °Summary °· Take care of the port as told by your health care provider. °· Change your dressing as told by your health care provider. °· Keep all follow-up visits as told by your health care provider. °  This information is not intended to replace advice given to you by your health care provider. Make sure you discuss any questions you have with your health care provider. °Document Released: 11/06/2012 Document Revised: 12/08/2015 Document Reviewed: 12/08/2015 °Elsevier Interactive Patient Education © 2017 Elsevier Inc[employer] ° °

## 2017-06-27 NOTE — Patient Instructions (Signed)
Carfilzomib injection What is this medicine? CARFILZOMIB (kar FILZ oh mib) targets a specific protein within cancer cells and stops the cancer cells from growing. It is used to treat multiple myeloma. This medicine may be used for other purposes; ask your health care provider or pharmacist if you have questions. COMMON BRAND NAME(S): KYPROLIS What should I tell my health care provider before I take this medicine? They need to know if you have any of these conditions: -heart disease -history of blood clots -irregular heartbeat -kidney disease -liver disease -lung or breathing disease -an unusual or allergic reaction to carfilzomib, or other medicines, foods, dyes, or preservatives -pregnant or trying to get pregnant -breast-feeding How should I use this medicine? This medicine is for injection or infusion into a vein. It is given by a health care professional in a hospital or clinic setting. Talk to your pediatrician regarding the use of this medicine in children. Special care may be needed. Overdosage: If you think you have taken too much of this medicine contact a poison control center or emergency room at once. NOTE: This medicine is only for you. Do not share this medicine with others. What if I miss a dose? It is important not to miss your dose. Call your doctor or health care professional if you are unable to keep an appointment. What may interact with this medicine? Interactions are not expected. Give your health care provider a list of all the medicines, herbs, non-prescription drugs, or dietary supplements you use. Also tell them if you smoke, drink alcohol, or use illegal drugs. Some items may interact with your medicine. This list may not describe all possible interactions. Give your health care provider a list of all the medicines, herbs, non-prescription drugs, or dietary supplements you use. Also tell them if you smoke, drink alcohol, or use illegal drugs. Some items may  interact with your medicine. What should I watch for while using this medicine? Your condition will be monitored carefully while you are receiving this medicine. Report any side effects. Continue your course of treatment even though you feel ill unless your doctor tells you to stop. You may need blood work done while you are taking this medicine. Do not become pregnant while taking this medicine or for at least 30 days after stopping it. Women should inform their doctor if they wish to become pregnant or think they might be pregnant. There is a potential for serious side effects to an unborn child. Men should not father a child while taking this medicine and for 90 days after stopping it. Talk to your health care professional or pharmacist for more information. Do not breast-feed an infant while taking this medicine. Check with your doctor or health care professional if you get an attack of severe diarrhea, nausea and vomiting, or if you sweat a lot. The loss of too much body fluid can make it dangerous for you to take this medicine. You may get dizzy. Do not drive, use machinery, or do anything that needs mental alertness until you know how this medicine affects you. Do not stand or sit up quickly, especially if you are an older patient. This reduces the risk of dizzy or fainting spells. What side effects may I notice from receiving this medicine? Side effects that you should report to your doctor or health care professional as soon as possible: -allergic reactions like skin rash, itching or hives, swelling of the face, lips, or tongue -confusion -dizziness -feeling faint or lightheaded -fever or chills -  palpitations -seizures -signs and symptoms of bleeding such as bloody or black, tarry stools; red or dark-brown urine; spitting up blood or brown material that looks like coffee grounds; red spots on the skin; unusual bruising or bleeding including from the eye, gums, or nose -signs and symptoms of  a blood clot such as breathing problems; changes in vision; chest pain; severe, sudden headache; pain, swelling, warmth in the leg; trouble speaking; sudden numbness or weakness of the face, arm or leg -signs and symptoms of kidney injury like trouble passing urine or change in the amount of urine -signs and symptoms of liver injury like dark yellow or brown urine; general ill feeling or flu-like symptoms; light-colored stools; loss of appetite; nausea; right upper belly pain; unusually weak or tired; yellowing of the eyes or skin Side effects that usually do not require medical attention (report to your doctor or health care professional if they continue or are bothersome): -back pain -cough -diarrhea -headache -muscle cramps -vomiting This list may not describe all possible side effects. Call your doctor for medical advice about side effects. You may report side effects to FDA at 1-800-FDA-1088. Where should I keep my medicine? This drug is given in a hospital or clinic and will not be stored at home. NOTE: This sheet is a summary. It may not cover all possible information. If you have questions about this medicine, talk to your doctor, pharmacist, or health care provider.  2018 Elsevier/Gold Standard (2015-02-18 13:39:23)  

## 2017-07-10 DIAGNOSIS — Z8542 Personal history of malignant neoplasm of other parts of uterus: Secondary | ICD-10-CM | POA: Diagnosis not present

## 2017-07-10 DIAGNOSIS — Z800 Family history of malignant neoplasm of digestive organs: Secondary | ICD-10-CM | POA: Diagnosis not present

## 2017-07-10 DIAGNOSIS — K625 Hemorrhage of anus and rectum: Secondary | ICD-10-CM | POA: Diagnosis not present

## 2017-07-11 ENCOUNTER — Inpatient Hospital Stay: Payer: Medicare Other

## 2017-07-11 ENCOUNTER — Other Ambulatory Visit: Payer: Self-pay

## 2017-07-11 ENCOUNTER — Inpatient Hospital Stay: Payer: Medicare Other | Attending: Hematology & Oncology | Admitting: Family

## 2017-07-11 ENCOUNTER — Encounter: Payer: Self-pay | Admitting: Family

## 2017-07-11 VITALS — BP 133/66 | HR 75 | Temp 98.5°F | Resp 18 | Wt 192.0 lb

## 2017-07-11 DIAGNOSIS — Z79811 Long term (current) use of aromatase inhibitors: Secondary | ICD-10-CM

## 2017-07-11 DIAGNOSIS — C541 Malignant neoplasm of endometrium: Secondary | ICD-10-CM | POA: Diagnosis not present

## 2017-07-11 DIAGNOSIS — C9000 Multiple myeloma not having achieved remission: Secondary | ICD-10-CM | POA: Diagnosis not present

## 2017-07-11 DIAGNOSIS — Z923 Personal history of irradiation: Secondary | ICD-10-CM | POA: Diagnosis not present

## 2017-07-11 DIAGNOSIS — Z7982 Long term (current) use of aspirin: Secondary | ICD-10-CM

## 2017-07-11 DIAGNOSIS — D500 Iron deficiency anemia secondary to blood loss (chronic): Secondary | ICD-10-CM | POA: Insufficient documentation

## 2017-07-11 DIAGNOSIS — Z5112 Encounter for antineoplastic immunotherapy: Secondary | ICD-10-CM | POA: Insufficient documentation

## 2017-07-11 DIAGNOSIS — Z79899 Other long term (current) drug therapy: Secondary | ICD-10-CM | POA: Diagnosis not present

## 2017-07-11 LAB — CBC WITH DIFFERENTIAL (CANCER CENTER ONLY)
Basophils Absolute: 0.1 10*3/uL (ref 0.0–0.1)
Basophils Relative: 2 %
Eosinophils Absolute: 0.1 10*3/uL (ref 0.0–0.5)
Eosinophils Relative: 4 %
HCT: 32.7 % — ABNORMAL LOW (ref 34.8–46.6)
Hemoglobin: 10.5 g/dL — ABNORMAL LOW (ref 11.6–15.9)
Lymphocytes Relative: 16 %
Lymphs Abs: 0.6 10*3/uL — ABNORMAL LOW (ref 0.9–3.3)
MCH: 27.1 pg (ref 26.0–34.0)
MCHC: 32.1 g/dL (ref 32.0–36.0)
MCV: 84.5 fL (ref 81.0–101.0)
Monocytes Absolute: 0.4 10*3/uL (ref 0.1–0.9)
Monocytes Relative: 11 %
Neutro Abs: 2.5 10*3/uL (ref 1.5–6.5)
Neutrophils Relative %: 67 %
Platelet Count: 250 10*3/uL (ref 145–400)
RBC: 3.87 MIL/uL (ref 3.70–5.32)
RDW: 17.3 % — ABNORMAL HIGH (ref 11.1–15.7)
WBC Count: 3.7 10*3/uL — ABNORMAL LOW (ref 3.9–10.0)

## 2017-07-11 LAB — CMP (CANCER CENTER ONLY)
ALT: 23 U/L (ref 10–47)
AST: 16 U/L (ref 11–38)
Albumin: 3.6 g/dL (ref 3.5–5.0)
Alkaline Phosphatase: 39 U/L (ref 26–84)
Anion gap: 5 (ref 5–15)
BUN: 14 mg/dL (ref 7–22)
CO2: 27 mmol/L (ref 18–33)
Calcium: 8.9 mg/dL (ref 8.0–10.3)
Chloride: 107 mmol/L (ref 98–108)
Creatinine: 1 mg/dL (ref 0.60–1.20)
Glucose, Bld: 116 mg/dL (ref 73–118)
Potassium: 3.1 mmol/L — ABNORMAL LOW (ref 3.3–4.7)
Sodium: 139 mmol/L (ref 128–145)
Total Bilirubin: 0.6 mg/dL (ref 0.2–1.6)
Total Protein: 6.1 g/dL — ABNORMAL LOW (ref 6.4–8.1)

## 2017-07-11 MED ORDER — DEXAMETHASONE SODIUM PHOSPHATE 10 MG/ML IJ SOLN
INTRAMUSCULAR | Status: AC
  Filled 2017-07-11: qty 1

## 2017-07-11 MED ORDER — DEXAMETHASONE SODIUM PHOSPHATE 10 MG/ML IJ SOLN
10.0000 mg | Freq: Once | INTRAMUSCULAR | Status: AC
  Administered 2017-07-11: 10 mg via INTRAVENOUS

## 2017-07-11 MED ORDER — SODIUM CHLORIDE 0.9 % IV SOLN: Freq: Once | INTRAVENOUS | Status: DC

## 2017-07-11 MED ORDER — SODIUM CHLORIDE 0.9% FLUSH
10.0000 mL | INTRAVENOUS | Status: DC | PRN
  Administered 2017-07-11: 10 mL
  Filled 2017-07-11: qty 10

## 2017-07-11 MED ORDER — HEPARIN SOD (PORK) LOCK FLUSH 100 UNIT/ML IV SOLN
500.0000 [IU] | Freq: Once | INTRAVENOUS | Status: AC | PRN
  Administered 2017-07-11: 500 [IU]
  Filled 2017-07-11: qty 5

## 2017-07-11 MED ORDER — SODIUM CHLORIDE 0.9 % IV SOLN
Freq: Once | INTRAVENOUS | Status: AC
  Administered 2017-07-11: 13:00:00 via INTRAVENOUS

## 2017-07-11 MED ORDER — CARFILZOMIB CHEMO INJECTION 60 MG
110.0000 mg | Freq: Once | INTRAVENOUS | Status: AC
  Administered 2017-07-11: 110 mg via INTRAVENOUS
  Filled 2017-07-11: qty 55

## 2017-07-11 NOTE — Progress Notes (Signed)
Hematology and Oncology Follow Up Visit  Emily Greer [id number] [birth year] 67 y.o. 07/11/2017   Principle Diagnosis:  Recurrent lambda light chain myeloma History of recurrent endometrial carcinoma  Past Therapy: Status post second autologous stem cell transplant on 07/24/2014 Maintenance therapy with Pomalidomide/every 2 week Velcade - d/c'ed Xgeva 120 mg subcutaneous every 3months -next dose inApril 2019 Radiation therapy for endometrial recurrence - completed 04/20/2015  Current Therapy:   Pomalyst/Kyprolis 70mg /m2 IV q 2 weeks -s/p cycle7 Femara 2.5 mg po q day   Interim History:  Emily Greer is here today for follow-up and treatment. She is doing fairly well. She has a cough with congestion and sinus drainage. She has made an appointment with her PCP tomorrow for further work up.  M-spike not observed in May, lambda light chain was 2.59 mg/dL.  No fever, chills, n/v, rash, dizziness, SOB, chest pain, palpitations, abdominal pain or changes in bowel or bladder habits.  She will occasionally get hot at night, no sweats.  She is scheduled for a colonoscopy on 08/10/2017.  She has had no episodes of bleeding, no bruising or petechiae.  No lymphadenopathy noted on exam.  She follows up again with gyn onc in December.  She states that she is taking both her Pomalyst and Femara as prescribed.  No swelling, tenderness, numbness or tingling in her extremities.  She has maintained a good appetite and is staying well hydrated. Her weight is stable.   ECOG Performance Status: 1 - Symptomatic but completely ambulatory  Medications:  Allergies as of 07/11/2017      Reactions   Codeine Nausea Only      Medication List        Accurate as of 07/11/17 11:31 AM. Always use your most recent med list.          acyclovir 400 MG tablet Commonly known as:  ZOVIRAX Take 1 tablet (400 mg total) by mouth daily.   albuterol 108 (90 Base) MCG/ACT inhaler Commonly known as:   PROVENTIL HFA;VENTOLIN HFA Inhale 2 puffs into the lungs every 6 (six) hours as needed for wheezing or shortness of breath. 2 puffs 3 times daily x 5 days then every 6 hours as needed.   amLODipine 10 MG tablet Commonly known as:  NORVASC TAKE 1 TABLET BY MOUTH DAILY--- takes in am   aspirin EC 81 MG tablet Take 81 mg by mouth 2 (two) times daily.   granisetron 3.1 MG/24HR Commonly known as:  SANCUSO Apply to skin starting 24 hours before chemotherapy. Remove after 7 days.   letrozole 2.5 MG tablet Commonly known as:  FEMARA Take 1 tablet (2.5 mg total) by mouth daily.   lidocaine-prilocaine cream Commonly known as:  EMLA Apply to affected area once   loperamide 2 MG capsule Commonly known as:  IMODIUM Take by mouth as needed for diarrhea or loose stools. Reported on 08/19/2015   loratadine 10 MG tablet Commonly known as:  CLARITIN Take 10 mg by mouth every morning. Reported on 08/19/2015   NASACORT ALLERGY 24HR 55 MCG/ACT Aero nasal inhaler Generic drug:  triamcinolone Place 2 sprays into the nose daily.   NOREL AD 4-10-325 MG Tabs Generic drug:  Chlorphen-PE-Acetaminophen Take by mouth.   ondansetron 8 MG tablet Commonly known as:  ZOFRAN Take 1 tablet (8 mg total) by mouth 2 (two) times daily as needed (Nausea or vomiting).   pomalidomide 1 MG capsule Commonly known as:  POMALYST TAKE 1 CAPSULE BY MOUTH ONCE DAILY FOR 21 DAYS  ON AND 7 DAYS OFF. [id number]   potassium chloride SA 20 MEQ tablet Commonly known as:  K-DUR,KLOR-CON TAKE 1 TABLET(20 MEQ) BY MOUTH TWICE DAILY   prochlorperazine 10 MG tablet Commonly known as:  COMPAZINE Take 1 tablet (10 mg total) by mouth every 6 (six) hours as needed (Nausea or vomiting).   triamterene-hydrochlorothiazide 37.5-25 MG tablet Commonly known as:  MAXZIDE-25 TK 1 T PO QD   Vitamin D3 2000 units Tabs Take 2 tablets by mouth daily.       Allergies:  Allergies  Allergen Reactions  . Codeine Nausea Only     Past Medical History, Surgical history, Social history, and Family History were reviewed and updated.  Review of Systems: All other 10 point review of systems is negative.   Physical Exam:  vitals were not taken for this visit.   Wt Readings from Last 3 Encounters:  06/13/17 194 lb (88 kg)  05/09/17 191 lb (86.6 kg)  04/11/17 189 lb 8 oz (86 kg)    Ocular: Sclerae unicteric, pupils equal, round and reactive to light Ear-nose-throat: Oropharynx clear, dentition fair Lymphatic: No cervical, supraclavicular or axillary adenopathy Lungs no rales or rhonchi, good excursion bilaterally Heart regular rate and rhythm, no murmur appreciated Abd soft, nontender, positive bowel sounds, no liver or spleen tip palpated on exam, no fluid wave  MSK no focal spinal tenderness, no joint edema Neuro: non-focal, well-oriented, appropriate affect Breasts: Deferred   Lab Results  Component Value Date   WBC 5.6 06/27/2017   HGB 11.4 (L) 06/27/2017   HCT 34.8 06/27/2017   MCV 83.1 06/27/2017   PLT 275 06/27/2017   Lab Results  Component Value Date   FERRITIN 61 06/13/2017   IRON 57 06/13/2017   TIBC 420 06/13/2017   UIBC 363 06/13/2017   IRONPCTSAT 14 (L) 06/13/2017   Lab Results  Component Value Date   RETICCTPCT 0.6 12/31/2014   RBC 4.19 06/27/2017   Lab Results  Component Value Date   KPAFRELGTCHN 4.9 06/13/2017   LAMBDASER 25.9 06/13/2017   KAPLAMBRATIO 0.19 (L) 06/13/2017   Lab Results  Component Value Date   IGGSERUM 439 (L) 06/13/2017   IGA 25 (L) 06/13/2017   IGMSERUM 8 (L) 06/13/2017   Lab Results  Component Value Date   TOTALPROTELP 6.1 06/13/2017   ALBUMINELP 4.0 06/13/2017   A1GS 0.2 06/13/2017   A2GS 0.7 06/13/2017   BETS 1.0 06/13/2017   BETA2SER 0.4 11/23/2014   GAMS 0.3 (L) 06/13/2017   MSPIKE Not Observed 06/13/2017   SPEI Comment 06/13/2017     Chemistry      Component Value Date/Time   NA 144 06/27/2017 1059   NA 141 01/10/2017 1115   NA 140  06/21/2016 0918   K 3.5 06/27/2017 1059   K 4.0 01/10/2017 1115   K 4.3 06/21/2016 0918   CL 107 06/27/2017 1059   CL 106 01/10/2017 1115   CO2 30 06/27/2017 1059   CO2 27 01/10/2017 1115   CO2 20 (L) 06/21/2016 0918   BUN 16 06/27/2017 1059   BUN 15 01/10/2017 1115   BUN 15.8 06/21/2016 0918   CREATININE 1.30 (H) 06/27/2017 1059   CREATININE 1.0 01/10/2017 1115   CREATININE 0.8 06/21/2016 0918      Component Value Date/Time   CALCIUM 9.8 06/27/2017 1059   CALCIUM 9.5 01/10/2017 1115   CALCIUM 9.4 06/21/2016 0918   ALKPHOS 44 06/27/2017 1059   ALKPHOS 40 01/10/2017 1115   ALKPHOS 66 06/21/2016 0918  AST 19 06/27/2017 1059   AST 17 06/21/2016 0918   ALT 24 06/27/2017 1059   ALT 19 01/10/2017 1115   ALT 37 06/21/2016 0918   BILITOT 0.7 06/27/2017 1059   BILITOT 0.32 06/21/2016 0918      Impression and Plan: Emily Greer is a very pleasant 67 yo African American female with recurrent lambda light chain myeloma and also localized recurrent endometrial cancer (followed by gyn onc). She is doing well on treatment with Pomalyst and Kyprolis.  She appears to have a sinus infection and is following up with her PCP tomorrow.  We will proceed with treatment today as planned.  She will be having her colonoscopy in July.  She will continue her every 2 week treatment schedule with follow-up in 1 month.  She will contact our office with any questions or concerns. We can certainly see her sooner if need be.    Emeline Gins, NP 6/12/201911:31 AM

## 2017-07-11 NOTE — Patient Instructions (Signed)
Carfilzomib injection What is this medicine? CARFILZOMIB (kar FILZ oh mib) targets a specific protein within cancer cells and stops the cancer cells from growing. It is used to treat multiple myeloma. This medicine may be used for other purposes; ask your health care provider or pharmacist if you have questions. COMMON BRAND NAME(S): KYPROLIS What should I tell my health care provider before I take this medicine? They need to know if you have any of these conditions: -heart disease -history of blood clots -irregular heartbeat -kidney disease -liver disease -lung or breathing disease -an unusual or allergic reaction to carfilzomib, or other medicines, foods, dyes, or preservatives -pregnant or trying to get pregnant -breast-feeding How should I use this medicine? This medicine is for injection or infusion into a vein. It is given by a health care professional in a hospital or clinic setting. Talk to your pediatrician regarding the use of this medicine in children. Special care may be needed. Overdosage: If you think you have taken too much of this medicine contact a poison control center or emergency room at once. NOTE: This medicine is only for you. Do not share this medicine with others. What if I miss a dose? It is important not to miss your dose. Call your doctor or health care professional if you are unable to keep an appointment. What may interact with this medicine? Interactions are not expected. Give your health care provider a list of all the medicines, herbs, non-prescription drugs, or dietary supplements you use. Also tell them if you smoke, drink alcohol, or use illegal drugs. Some items may interact with your medicine. This list may not describe all possible interactions. Give your health care provider a list of all the medicines, herbs, non-prescription drugs, or dietary supplements you use. Also tell them if you smoke, drink alcohol, or use illegal drugs. Some items may  interact with your medicine. What should I watch for while using this medicine? Your condition will be monitored carefully while you are receiving this medicine. Report any side effects. Continue your course of treatment even though you feel ill unless your doctor tells you to stop. You may need blood work done while you are taking this medicine. Do not become pregnant while taking this medicine or for at least 30 days after stopping it. Women should inform their doctor if they wish to become pregnant or think they might be pregnant. There is a potential for serious side effects to an unborn child. Men should not father a child while taking this medicine and for 90 days after stopping it. Talk to your health care professional or pharmacist for more information. Do not breast-feed an infant while taking this medicine. Check with your doctor or health care professional if you get an attack of severe diarrhea, nausea and vomiting, or if you sweat a lot. The loss of too much body fluid can make it dangerous for you to take this medicine. You may get dizzy. Do not drive, use machinery, or do anything that needs mental alertness until you know how this medicine affects you. Do not stand or sit up quickly, especially if you are an older patient. This reduces the risk of dizzy or fainting spells. What side effects may I notice from receiving this medicine? Side effects that you should report to your doctor or health care professional as soon as possible: -allergic reactions like skin rash, itching or hives, swelling of the face, lips, or tongue -confusion -dizziness -feeling faint or lightheaded -fever or chills -  palpitations -seizures -signs and symptoms of bleeding such as bloody or black, tarry stools; red or dark-brown urine; spitting up blood or brown material that looks like coffee grounds; red spots on the skin; unusual bruising or bleeding including from the eye, gums, or nose -signs and symptoms of  a blood clot such as breathing problems; changes in vision; chest pain; severe, sudden headache; pain, swelling, warmth in the leg; trouble speaking; sudden numbness or weakness of the face, arm or leg -signs and symptoms of kidney injury like trouble passing urine or change in the amount of urine -signs and symptoms of liver injury like dark yellow or brown urine; general ill feeling or flu-like symptoms; light-colored stools; loss of appetite; nausea; right upper belly pain; unusually weak or tired; yellowing of the eyes or skin Side effects that usually do not require medical attention (report to your doctor or health care professional if they continue or are bothersome): -back pain -cough -diarrhea -headache -muscle cramps -vomiting This list may not describe all possible side effects. Call your doctor for medical advice about side effects. You may report side effects to FDA at 1-800-FDA-1088. Where should I keep my medicine? This drug is given in a hospital or clinic and will not be stored at home. NOTE: This sheet is a summary. It may not cover all possible information. If you have questions about this medicine, talk to your doctor, pharmacist, or health care provider.  2018 Elsevier/Gold Standard (2015-02-18 13:39:23)  

## 2017-07-11 NOTE — Patient Instructions (Signed)
Implanted Port Home Guide An implanted port is a type of central line that is placed under the skin. Central lines are used to provide IV access when treatment or nutrition needs to be given through a person's veins. Implanted ports are used for long-term IV access. An implanted port may be placed because:  You need IV medicine that would be irritating to the small veins in your hands or arms.  You need long-term IV medicines, such as antibiotics.  You need IV nutrition for a long period.  You need frequent blood draws for lab tests.  You need dialysis.  Implanted ports are usually placed in the chest area, but they can also be placed in the upper arm, the abdomen, or the leg. An implanted port has two main parts:  Reservoir. The reservoir is round and will appear as a small, raised area under your skin. The reservoir is the part where a needle is inserted to give medicines or draw blood.  Catheter. The catheter is a thin, flexible tube that extends from the reservoir. The catheter is placed into a large vein. Medicine that is inserted into the reservoir goes into the catheter and then into the vein.  How will I care for my incision site? Do not get the incision site wet. Bathe or shower as directed by your health care provider. How is my port accessed? Special steps must be taken to access the port:  Before the port is accessed, a numbing cream can be placed on the skin. This helps numb the skin over the port site.  Your health care provider uses a sterile technique to access the port. ? Your health care provider must put on a mask and sterile gloves. ? The skin over your port is cleaned carefully with an antiseptic and allowed to dry. ? The port is gently pinched between sterile gloves, and a needle is inserted into the port.  Only "non-coring" port needles should be used to access the port. Once the port is accessed, a blood return should be checked. This helps ensure that the port  is in the vein and is not clogged.  If your port needs to remain accessed for a constant infusion, a clear (transparent) bandage will be placed over the needle site. The bandage and needle will need to be changed every week, or as directed by your health care provider.  Keep the bandage covering the needle clean and dry. Do not get it wet. Follow your health care provider's instructions on how to take a shower or bath while the port is accessed.  If your port does not need to stay accessed, no bandage is needed over the port.  What is flushing? Flushing helps keep the port from getting clogged. Follow your health care provider's instructions on how and when to flush the port. Ports are usually flushed with saline solution or a medicine called heparin. The need for flushing will depend on how the port is used.  If the port is used for intermittent medicines or blood draws, the port will need to be flushed: ? After medicines have been given. ? After blood has been drawn. ? As part of routine maintenance.  If a constant infusion is running, the port may not need to be flushed.  How long will my port stay implanted? The port can stay in for as long as your health care provider thinks it is needed. When it is time for the port to come out, surgery will be   done to remove it. The procedure is similar to the one performed when the port was put in. When should I seek immediate medical care? When you have an implanted port, you should seek immediate medical care if:  You notice a bad smell coming from the incision site.  You have swelling, redness, or drainage at the incision site.  You have more swelling or pain at the port site or the surrounding area.  You have a fever that is not controlled with medicine.  This information is not intended to replace advice given to you by your health care provider. Make sure you discuss any questions you have with your health care provider. Document  Released: 01/16/2005 Document Revised: 06/24/2015 Document Reviewed: 09/23/2012 Elsevier Interactive Patient Education  2017 Elsevier Inc.  

## 2017-07-12 DIAGNOSIS — J309 Allergic rhinitis, unspecified: Secondary | ICD-10-CM | POA: Diagnosis not present

## 2017-07-12 LAB — KAPPA/LAMBDA LIGHT CHAINS
Kappa free light chain: 5.1 mg/L (ref 3.3–19.4)
Kappa, lambda light chain ratio: 0.18 — ABNORMAL LOW (ref 0.26–1.65)
Lambda free light chains: 27.8 mg/L — ABNORMAL HIGH (ref 5.7–26.3)

## 2017-07-12 LAB — IRON AND TIBC
Iron: 57 ug/dL (ref 41–142)
Saturation Ratios: 16 % — ABNORMAL LOW (ref 21–57)
TIBC: 358 ug/dL (ref 236–444)
UIBC: 301 ug/dL

## 2017-07-12 LAB — IGG, IGA, IGM
IgA: 20 mg/dL — ABNORMAL LOW (ref 87–352)
IgG (Immunoglobin G), Serum: 407 mg/dL — ABNORMAL LOW (ref 700–1600)
IgM (Immunoglobulin M), Srm: 11 mg/dL — ABNORMAL LOW (ref 26–217)

## 2017-07-12 LAB — FERRITIN: Ferritin: 786 ng/mL — ABNORMAL HIGH (ref 9–269)

## 2017-07-13 LAB — PROTEIN ELECTROPHORESIS, SERUM
A/G Ratio: 2 — ABNORMAL HIGH (ref 0.7–1.7)
Albumin ELP: 3.9 g/dL (ref 2.9–4.4)
Alpha-1-Globulin: 0.2 g/dL (ref 0.0–0.4)
Alpha-2-Globulin: 0.6 g/dL (ref 0.4–1.0)
Beta Globulin: 0.9 g/dL (ref 0.7–1.3)
Gamma Globulin: 0.3 g/dL — ABNORMAL LOW (ref 0.4–1.8)
Globulin, Total: 2 g/dL — ABNORMAL LOW (ref 2.2–3.9)
Total Protein ELP: 5.9 g/dL — ABNORMAL LOW (ref 6.0–8.5)

## 2017-07-24 ENCOUNTER — Other Ambulatory Visit: Payer: Self-pay | Admitting: *Deleted

## 2017-07-24 DIAGNOSIS — C9000 Multiple myeloma not having achieved remission: Secondary | ICD-10-CM

## 2017-07-25 ENCOUNTER — Inpatient Hospital Stay: Payer: Medicare Other

## 2017-07-25 ENCOUNTER — Other Ambulatory Visit: Payer: Self-pay

## 2017-07-25 VITALS — BP 138/64 | HR 65 | Resp 18

## 2017-07-25 DIAGNOSIS — C541 Malignant neoplasm of endometrium: Secondary | ICD-10-CM | POA: Diagnosis not present

## 2017-07-25 DIAGNOSIS — D500 Iron deficiency anemia secondary to blood loss (chronic): Secondary | ICD-10-CM | POA: Diagnosis not present

## 2017-07-25 DIAGNOSIS — Z5112 Encounter for antineoplastic immunotherapy: Secondary | ICD-10-CM | POA: Diagnosis not present

## 2017-07-25 DIAGNOSIS — Z7982 Long term (current) use of aspirin: Secondary | ICD-10-CM | POA: Diagnosis not present

## 2017-07-25 DIAGNOSIS — D508 Other iron deficiency anemias: Secondary | ICD-10-CM

## 2017-07-25 DIAGNOSIS — C9000 Multiple myeloma not having achieved remission: Secondary | ICD-10-CM | POA: Diagnosis not present

## 2017-07-25 DIAGNOSIS — C9001 Multiple myeloma in remission: Secondary | ICD-10-CM

## 2017-07-25 DIAGNOSIS — Z79811 Long term (current) use of aromatase inhibitors: Secondary | ICD-10-CM | POA: Diagnosis not present

## 2017-07-25 LAB — CMP (CANCER CENTER ONLY)
ALT: 22 U/L (ref 10–47)
AST: 21 U/L (ref 11–38)
Albumin: 3.5 g/dL (ref 3.5–5.0)
Alkaline Phosphatase: 45 U/L (ref 26–84)
Anion gap: 6 (ref 5–15)
BUN: 14 mg/dL (ref 7–22)
CO2: 27 mmol/L (ref 18–33)
Calcium: 8.8 mg/dL (ref 8.0–10.3)
Chloride: 108 mmol/L (ref 98–108)
Creatinine: 0.9 mg/dL (ref 0.60–1.20)
Glucose, Bld: 96 mg/dL (ref 73–118)
Potassium: 3.5 mmol/L (ref 3.3–4.7)
Sodium: 141 mmol/L (ref 128–145)
Total Bilirubin: 0.6 mg/dL (ref 0.2–1.6)
Total Protein: 6.3 g/dL — ABNORMAL LOW (ref 6.4–8.1)

## 2017-07-25 LAB — CBC WITH DIFFERENTIAL (CANCER CENTER ONLY)
Basophils Absolute: 0.1 10*3/uL (ref 0.0–0.1)
Basophils Relative: 3 %
Eosinophils Absolute: 0.2 10*3/uL (ref 0.0–0.5)
Eosinophils Relative: 6 %
HCT: 32.1 % — ABNORMAL LOW (ref 34.8–46.6)
Hemoglobin: 10.5 g/dL — ABNORMAL LOW (ref 11.6–15.9)
Lymphocytes Relative: 16 %
Lymphs Abs: 0.6 10*3/uL — ABNORMAL LOW (ref 0.9–3.3)
MCH: 27.8 pg (ref 26.0–34.0)
MCHC: 32.7 g/dL (ref 32.0–36.0)
MCV: 84.9 fL (ref 81.0–101.0)
Monocytes Absolute: 0.4 10*3/uL (ref 0.1–0.9)
Monocytes Relative: 12 %
Neutro Abs: 2.3 10*3/uL (ref 1.5–6.5)
Neutrophils Relative %: 63 %
Platelet Count: 242 10*3/uL (ref 145–400)
RBC: 3.78 MIL/uL (ref 3.70–5.32)
RDW: 17.9 % — ABNORMAL HIGH (ref 11.1–15.7)
WBC Count: 3.7 10*3/uL — ABNORMAL LOW (ref 3.9–10.0)

## 2017-07-25 MED ORDER — DEXAMETHASONE SODIUM PHOSPHATE 100 MG/10ML IJ SOLN: Freq: Once | INTRAMUSCULAR | Status: DC

## 2017-07-25 MED ORDER — SODIUM CHLORIDE 0.9% FLUSH
10.0000 mL | INTRAVENOUS | Status: DC | PRN
  Filled 2017-07-25: qty 10

## 2017-07-25 MED ORDER — SODIUM CHLORIDE 0.9 % IV SOLN: Freq: Once | INTRAVENOUS | Status: DC

## 2017-07-25 MED ORDER — HEPARIN SOD (PORK) LOCK FLUSH 100 UNIT/ML IV SOLN
500.0000 [IU] | Freq: Once | INTRAVENOUS | Status: AC | PRN
  Administered 2017-07-25: 500 [IU]
  Filled 2017-07-25: qty 5

## 2017-07-25 MED ORDER — FERUMOXYTOL INJECTION 510 MG/17 ML
510.0000 mg | Freq: Once | INTRAVENOUS | Status: AC
  Administered 2017-07-25: 510 mg via INTRAVENOUS
  Filled 2017-07-25: qty 17

## 2017-07-25 MED ORDER — DEXAMETHASONE SODIUM PHOSPHATE 10 MG/ML IJ SOLN
10.0000 mg | Freq: Once | INTRAMUSCULAR | Status: AC
  Administered 2017-07-25: 10 mg via INTRAVENOUS

## 2017-07-25 MED ORDER — SODIUM CHLORIDE 0.9% FLUSH
10.0000 mL | INTRAVENOUS | Status: DC | PRN
  Administered 2017-07-25: 10 mL
  Filled 2017-07-25: qty 10

## 2017-07-25 MED ORDER — SODIUM CHLORIDE 0.9 % IV SOLN
Freq: Once | INTRAVENOUS | Status: AC
  Administered 2017-07-25: 13:00:00 via INTRAVENOUS

## 2017-07-25 MED ORDER — DEXAMETHASONE SODIUM PHOSPHATE 10 MG/ML IJ SOLN
INTRAMUSCULAR | Status: AC
  Filled 2017-07-25: qty 1

## 2017-07-25 MED ORDER — SODIUM CHLORIDE 0.9 % IV SOLN
Freq: Once | INTRAVENOUS | Status: AC
  Administered 2017-07-25: 12:00:00 via INTRAVENOUS

## 2017-07-25 MED ORDER — DEXTROSE 5 % IV SOLN
55.2000 mg/m2 | Freq: Once | INTRAVENOUS | Status: AC
  Administered 2017-07-25: 110 mg via INTRAVENOUS
  Filled 2017-07-25: qty 30

## 2017-07-25 NOTE — Patient Instructions (Signed)
Carfilzomib injection What is this medicine? CARFILZOMIB (kar FILZ oh mib) targets a specific protein within cancer cells and stops the cancer cells from growing. It is used to treat multiple myeloma. This medicine may be used for other purposes; ask your health care provider or pharmacist if you have questions. COMMON BRAND NAME(S): KYPROLIS What should I tell my health care provider before I take this medicine? They need to know if you have any of these conditions: -heart disease -history of blood clots -irregular heartbeat -kidney disease -liver disease -lung or breathing disease -an unusual or allergic reaction to carfilzomib, or other medicines, foods, dyes, or preservatives -pregnant or trying to get pregnant -breast-feeding How should I use this medicine? This medicine is for injection or infusion into a vein. It is given by a health care professional in a hospital or clinic setting. Talk to your pediatrician regarding the use of this medicine in children. Special care may be needed. Overdosage: If you think you have taken too much of this medicine contact a poison control center or emergency room at once. NOTE: This medicine is only for you. Do not share this medicine with others. What if I miss a dose? It is important not to miss your dose. Call your doctor or health care professional if you are unable to keep an appointment. What may interact with this medicine? Interactions are not expected. Give your health care provider a list of all the medicines, herbs, non-prescription drugs, or dietary supplements you use. Also tell them if you smoke, drink alcohol, or use illegal drugs. Some items may interact with your medicine. This list may not describe all possible interactions. Give your health care provider a list of all the medicines, herbs, non-prescription drugs, or dietary supplements you use. Also tell them if you smoke, drink alcohol, or use illegal drugs. Some items may  interact with your medicine. What should I watch for while using this medicine? Your condition will be monitored carefully while you are receiving this medicine. Report any side effects. Continue your course of treatment even though you feel ill unless your doctor tells you to stop. You may need blood work done while you are taking this medicine. Do not become pregnant while taking this medicine or for at least 30 days after stopping it. Women should inform their doctor if they wish to become pregnant or think they might be pregnant. There is a potential for serious side effects to an unborn child. Men should not father a child while taking this medicine and for 90 days after stopping it. Talk to your health care professional or pharmacist for more information. Do not breast-feed an infant while taking this medicine. Check with your doctor or health care professional if you get an attack of severe diarrhea, nausea and vomiting, or if you sweat a lot. The loss of too much body fluid can make it dangerous for you to take this medicine. You may get dizzy. Do not drive, use machinery, or do anything that needs mental alertness until you know how this medicine affects you. Do not stand or sit up quickly, especially if you are an older patient. This reduces the risk of dizzy or fainting spells. What side effects may I notice from receiving this medicine? Side effects that you should report to your doctor or health care professional as soon as possible: -allergic reactions like skin rash, itching or hives, swelling of the face, lips, or tongue -confusion -dizziness -feeling faint or lightheaded -fever or chills -  palpitations -seizures -signs and symptoms of bleeding such as bloody or black, tarry stools; red or dark-brown urine; spitting up blood or brown material that looks like coffee grounds; red spots on the skin; unusual bruising or bleeding including from the eye, gums, or nose -signs and symptoms of  a blood clot such as breathing problems; changes in vision; chest pain; severe, sudden headache; pain, swelling, warmth in the leg; trouble speaking; sudden numbness or weakness of the face, arm or leg -signs and symptoms of kidney injury like trouble passing urine or change in the amount of urine -signs and symptoms of liver injury like dark yellow or brown urine; general ill feeling or flu-like symptoms; light-colored stools; loss of appetite; nausea; right upper belly pain; unusually weak or tired; yellowing of the eyes or skin Side effects that usually do not require medical attention (report to your doctor or health care professional if they continue or are bothersome): -back pain -cough -diarrhea -headache -muscle cramps -vomiting This list may not describe all possible side effects. Call your doctor for medical advice about side effects. You may report side effects to FDA at 1-800-FDA-1088. Where should I keep my medicine? This drug is given in a hospital or clinic and will not be stored at home. NOTE: This sheet is a summary. It may not cover all possible information. If you have questions about this medicine, talk to your doctor, pharmacist, or health care provider.  2018 Elsevier/Gold Standard (2015-02-18 13:39:23)  Ferumoxytol injection What is this medicine? FERUMOXYTOL is an iron complex. Iron is used to make healthy red blood cells, which carry oxygen and nutrients throughout the body. This medicine is used to treat iron deficiency anemia in people with chronic kidney disease. This medicine may be used for other purposes; ask your health care provider or pharmacist if you have questions. COMMON BRAND NAME(S): Feraheme What should I tell my health care provider before I take this medicine? They need to know if you have any of these conditions: -anemia not caused by low iron levels -high levels of iron in the blood -magnetic resonance imaging (MRI) test scheduled -an unusual or  allergic reaction to iron, other medicines, foods, dyes, or preservatives -pregnant or trying to get pregnant -breast-feeding How should I use this medicine? This medicine is for injection into a vein. It is given by a health care professional in a hospital or clinic setting. Talk to your pediatrician regarding the use of this medicine in children. Special care may be needed. Overdosage: If you think you have taken too much of this medicine contact a poison control center or emergency room at once. NOTE: This medicine is only for you. Do not share this medicine with others. What if I miss a dose? It is important not to miss your dose. Call your doctor or health care professional if you are unable to keep an appointment. What may interact with this medicine? This medicine may interact with the following medications: -other iron products This list may not describe all possible interactions. Give your health care provider a list of all the medicines, herbs, non-prescription drugs, or dietary supplements you use. Also tell them if you smoke, drink alcohol, or use illegal drugs. Some items may interact with your medicine. What should I watch for while using this medicine? Visit your doctor or healthcare professional regularly. Tell your doctor or healthcare professional if your symptoms do not start to get better or if they get worse. You may need blood work done  while you are taking this medicine. You may need to follow a special diet. Talk to your doctor. Foods that contain iron include: whole grains/cereals, dried fruits, beans, or peas, leafy green vegetables, and organ meats (liver, kidney). What side effects may I notice from receiving this medicine? Side effects that you should report to your doctor or health care professional as soon as possible: -allergic reactions like skin rash, itching or hives, swelling of the face, lips, or tongue -breathing problems -changes in blood pressure -feeling  faint or lightheaded, falls -fever or chills -flushing, sweating, or hot feelings -swelling of the ankles or feet Side effects that usually do not require medical attention (report to your doctor or health care professional if they continue or are bothersome): -diarrhea -headache -nausea, vomiting -stomach pain This list may not describe all possible side effects. Call your doctor for medical advice about side effects. You may report side effects to FDA at 1-800-FDA-1088. Where should I keep my medicine? This drug is given in a hospital or clinic and will not be stored at home. NOTE: This sheet is a summary. It may not cover all possible information. If you have questions about this medicine, talk to your doctor, pharmacist, or health care provider.  2018 Elsevier/Gold Standard (2015-02-18 12:41:49)

## 2017-07-26 ENCOUNTER — Other Ambulatory Visit: Payer: Self-pay | Admitting: *Deleted

## 2017-07-26 DIAGNOSIS — C9000 Multiple myeloma not having achieved remission: Secondary | ICD-10-CM

## 2017-07-26 MED ORDER — POMALIDOMIDE 1 MG PO CAPS: ORAL_CAPSULE | ORAL | 0 refills | Status: DC

## 2017-08-08 ENCOUNTER — Inpatient Hospital Stay: Payer: Medicare Other

## 2017-08-08 ENCOUNTER — Inpatient Hospital Stay: Payer: Medicare Other | Attending: Hematology & Oncology | Admitting: Family

## 2017-08-08 ENCOUNTER — Encounter: Payer: Self-pay | Admitting: Family

## 2017-08-08 ENCOUNTER — Other Ambulatory Visit: Payer: Self-pay

## 2017-08-08 VITALS — BP 137/84 | HR 74 | Temp 98.4°F | Resp 20 | Wt 195.1 lb

## 2017-08-08 DIAGNOSIS — Z5112 Encounter for antineoplastic immunotherapy: Secondary | ICD-10-CM | POA: Insufficient documentation

## 2017-08-08 DIAGNOSIS — C9000 Multiple myeloma not having achieved remission: Secondary | ICD-10-CM | POA: Diagnosis not present

## 2017-08-08 DIAGNOSIS — D500 Iron deficiency anemia secondary to blood loss (chronic): Secondary | ICD-10-CM

## 2017-08-08 DIAGNOSIS — Z79899 Other long term (current) drug therapy: Secondary | ICD-10-CM | POA: Diagnosis not present

## 2017-08-08 DIAGNOSIS — Z79811 Long term (current) use of aromatase inhibitors: Secondary | ICD-10-CM | POA: Insufficient documentation

## 2017-08-08 DIAGNOSIS — C541 Malignant neoplasm of endometrium: Secondary | ICD-10-CM | POA: Diagnosis not present

## 2017-08-08 DIAGNOSIS — Z923 Personal history of irradiation: Secondary | ICD-10-CM | POA: Insufficient documentation

## 2017-08-08 DIAGNOSIS — Z7982 Long term (current) use of aspirin: Secondary | ICD-10-CM | POA: Diagnosis not present

## 2017-08-08 LAB — CBC WITH DIFFERENTIAL (CANCER CENTER ONLY)
Basophils Absolute: 0.1 10*3/uL (ref 0.0–0.1)
Basophils Relative: 2 %
Eosinophils Absolute: 0.2 10*3/uL (ref 0.0–0.5)
Eosinophils Relative: 5 %
HCT: 33.5 % — ABNORMAL LOW (ref 34.8–46.6)
Hemoglobin: 11.2 g/dL — ABNORMAL LOW (ref 11.6–15.9)
Lymphocytes Relative: 22 %
Lymphs Abs: 0.7 10*3/uL — ABNORMAL LOW (ref 0.9–3.3)
MCH: 28.1 pg (ref 26.0–34.0)
MCHC: 33.4 g/dL (ref 32.0–36.0)
MCV: 84.2 fL (ref 81.0–101.0)
Monocytes Absolute: 0.5 10*3/uL (ref 0.1–0.9)
Monocytes Relative: 18 %
Neutro Abs: 1.5 10*3/uL (ref 1.5–6.5)
Neutrophils Relative %: 53 %
Platelet Count: 214 10*3/uL (ref 145–400)
RBC: 3.98 MIL/uL (ref 3.70–5.32)
RDW: 17.8 % — ABNORMAL HIGH (ref 11.1–15.7)
WBC Count: 3 10*3/uL — ABNORMAL LOW (ref 3.9–10.0)

## 2017-08-08 LAB — CMP (CANCER CENTER ONLY)
ALT: 25 U/L (ref 10–47)
AST: 23 U/L (ref 11–38)
Albumin: 3.9 g/dL (ref 3.5–5.0)
Alkaline Phosphatase: 46 U/L (ref 26–84)
Anion gap: 13 (ref 5–15)
BUN: 17 mg/dL (ref 7–22)
CO2: 28 mmol/L (ref 18–33)
Calcium: 9.6 mg/dL (ref 8.0–10.3)
Chloride: 102 mmol/L (ref 98–108)
Creatinine: 1 mg/dL (ref 0.60–1.20)
Glucose, Bld: 98 mg/dL (ref 73–118)
Potassium: 3.3 mmol/L (ref 3.3–4.7)
Sodium: 143 mmol/L (ref 128–145)
Total Bilirubin: 0.7 mg/dL (ref 0.2–1.6)
Total Protein: 6.4 g/dL (ref 6.4–8.1)

## 2017-08-08 MED ORDER — HEPARIN SOD (PORK) LOCK FLUSH 100 UNIT/ML IV SOLN
500.0000 [IU] | Freq: Once | INTRAVENOUS | Status: AC | PRN
  Administered 2017-08-08: 500 [IU]
  Filled 2017-08-08: qty 5

## 2017-08-08 MED ORDER — SODIUM CHLORIDE 0.9 % IV SOLN
Freq: Once | INTRAVENOUS | Status: AC
  Administered 2017-08-08: 12:00:00 via INTRAVENOUS

## 2017-08-08 MED ORDER — DEXAMETHASONE SODIUM PHOSPHATE 10 MG/ML IJ SOLN
INTRAMUSCULAR | Status: AC
  Filled 2017-08-08: qty 1

## 2017-08-08 MED ORDER — DEXAMETHASONE SODIUM PHOSPHATE 10 MG/ML IJ SOLN
10.0000 mg | Freq: Once | INTRAMUSCULAR | Status: AC
  Administered 2017-08-08: 10 mg via INTRAVENOUS

## 2017-08-08 MED ORDER — CARFILZOMIB CHEMO INJECTION 60 MG
56.0000 mg/m2 | Freq: Once | INTRAVENOUS | Status: AC
  Administered 2017-08-08: 112 mg via INTRAVENOUS
  Filled 2017-08-08: qty 56

## 2017-08-08 MED ORDER — SODIUM CHLORIDE 0.9 % IV SOLN: Freq: Once | INTRAVENOUS | Status: DC

## 2017-08-08 MED ORDER — SODIUM CHLORIDE 0.9% FLUSH
10.0000 mL | INTRAVENOUS | Status: DC | PRN
  Administered 2017-08-08: 10 mL
  Filled 2017-08-08: qty 10

## 2017-08-08 NOTE — Progress Notes (Signed)
Hematology and Oncology Follow Up Visit  Emily Greer [id number] 10-Oct-1950 67 y.o. 08/08/2017   Principle Diagnosis:  Recurrent lambda light chain myeloma History of recurrent endometrial carcinoma  Past Therapy:   Status post second autologous stem cell transplant on 07/24/2014 Maintenance therapy with Pomalidomide/every 2 week Velcade - d/c'ed Xgeva 120 mg subcutaneous every 3months -next dose inApril 2019 Radiation therapy for endometrial recurrence - completed 04/20/2015  Current Therapy: Pomalyst/Kyprolis 70mg /m2 IV q 2 weeks - s/p cycle8 Femara 2.5 mg po q day   Interim History:  Emily Greer is here today for follow-up and treatment. She is doing well but has had some mild fatigue due to staying busy with family.  No M-spike in June and lambda light chain was 2.78 mg/dL.  She is scheduled to have her colonoscopy in Friday to assess for GI bleed source. She has not noticed and recent blood in her stool. No other bleeding, no bruising or petechiae.  No fever, chills, n/v, cough, rash, dizziness, SOB, chest pain, palpitations, abdominal pain or changes in bladder habits. No diarrhea.  She will have the occasional night sweat of hot flash. She states that these are mild and tolerable.  She is doing well on both Femara and Pomalyst. She verbalized that she is taking these as prescribed.  No swelling, tenderness, numbness or tingling in her extremities. No c/o pain. No lymphadenopathy noted on her exam. She has a good appetite and is staying well hydrated. Her weight is stable.   ECOG Performance Status: 1 - Symptomatic but completely ambulatory  Medications:  Allergies as of 08/08/2017      Reactions   Codeine Nausea Only      Medication List        Accurate as of 08/08/17 11:35 AM. Always use your most recent med list.          acyclovir 400 MG tablet Commonly known as:  ZOVIRAX Take 1 tablet (400 mg total) by mouth daily.   albuterol 108 (90 Base) MCG/ACT  inhaler Commonly known as:  PROVENTIL HFA;VENTOLIN HFA Inhale 2 puffs into the lungs every 6 (six) hours as needed for wheezing or shortness of breath. 2 puffs 3 times daily x 5 days then every 6 hours as needed.   amLODipine 10 MG tablet Commonly known as:  NORVASC TAKE 1 TABLET BY MOUTH DAILY--- takes in am   aspirin EC 81 MG tablet Take 81 mg by mouth 2 (two) times daily.   granisetron 3.1 MG/24HR Commonly known as:  SANCUSO Apply to skin starting 24 hours before chemotherapy. Remove after 7 days.   letrozole 2.5 MG tablet Commonly known as:  FEMARA Take 1 tablet (2.5 mg total) by mouth daily.   lidocaine-prilocaine cream Commonly known as:  EMLA Apply to affected area once   loperamide 2 MG capsule Commonly known as:  IMODIUM Take by mouth as needed for diarrhea or loose stools. Reported on 08/19/2015   loratadine 10 MG tablet Commonly known as:  CLARITIN Take 10 mg by mouth every morning. Reported on 08/19/2015   NASACORT ALLERGY 24HR 55 MCG/ACT Aero nasal inhaler Generic drug:  triamcinolone Place 2 sprays into the nose daily.   NOREL AD 4-10-325 MG Tabs Generic drug:  Chlorphen-PE-Acetaminophen Take by mouth as needed.   ondansetron 8 MG tablet Commonly known as:  ZOFRAN Take 1 tablet (8 mg total) by mouth 2 (two) times daily as needed (Nausea or vomiting).   pomalidomide 1 MG capsule Commonly known as:  POMALYST  TAKE 1 CAPSULE BY MOUTH ONCE DAILY FOR 21 DAYS ON AND 7 DAYS OFF. ZOXW#[id number]   potassium chloride SA 20 MEQ tablet Commonly known as:  K-DUR,KLOR-CON TAKE 1 TABLET(20 MEQ) BY MOUTH TWICE DAILY   prochlorperazine 10 MG tablet Commonly known as:  COMPAZINE Take 1 tablet (10 mg total) by mouth every 6 (six) hours as needed (Nausea or vomiting).   triamterene-hydrochlorothiazide 37.5-25 MG tablet Commonly known as:  MAXZIDE-25 TK 1 T PO QD   Vitamin D3 2000 units Tabs Take 2 tablets by mouth daily.       Allergies:  Allergies  Allergen  Reactions  . Codeine Nausea Only    Past Medical History, Surgical history, Social history, and Family History were reviewed and updated.  Review of Systems: All other 10 point review of systems is negative.   Physical Exam:  weight is 195 lb 1.9 oz (88.5 kg). Her oral temperature is 98.4 F (36.9 C). Her blood pressure is 137/84 and her pulse is 74. Her respiration is 20 and oxygen saturation is 100%.   Wt Readings from Last 3 Encounters:  08/08/17 195 lb 1.9 oz (88.5 kg)  07/11/17 192 lb (87.1 kg)  06/13/17 194 lb (88 kg)    Ocular: Sclerae unicteric, pupils equal, round and reactive to light Ear-nose-throat: Oropharynx clear, dentition fair Lymphatic: No cervical, supraclavicular or axillary adenopathy Lungs no rales or rhonchi, good excursion bilaterally Heart regular rate and rhythm, no murmur appreciated Abd soft, nontender, positive bowel sounds, no liver or spleen tip palpated on exam, no fluid wave  MSK no focal spinal tenderness, no joint edema Neuro: non-focal, well-oriented, appropriate affect Breasts: Deferred   Lab Results  Component Value Date   WBC 3.0 (L) 08/08/2017   HGB 11.2 (L) 08/08/2017   HCT 33.5 (L) 08/08/2017   MCV 84.2 08/08/2017   PLT 214 08/08/2017   Lab Results  Component Value Date   FERRITIN 786 (H) 07/11/2017   IRON 57 07/11/2017   TIBC 358 07/11/2017   UIBC 301 07/11/2017   IRONPCTSAT 16 (L) 07/11/2017   Lab Results  Component Value Date   RETICCTPCT 0.6 12/31/2014   RBC 3.98 08/08/2017   Lab Results  Component Value Date   KPAFRELGTCHN 5.1 07/11/2017   LAMBDASER 27.8 (H) 07/11/2017   KAPLAMBRATIO 0.18 (L) 07/11/2017   Lab Results  Component Value Date   IGGSERUM 407 (L) 07/11/2017   IGA 20 (L) 07/11/2017   IGMSERUM 11 (L) 07/11/2017   Lab Results  Component Value Date   TOTALPROTELP 5.9 (L) 07/11/2017   ALBUMINELP 3.9 07/11/2017   A1GS 0.2 07/11/2017   A2GS 0.6 07/11/2017   BETS 0.9 07/11/2017   BETA2SER 0.4  11/23/2014   GAMS 0.3 (L) 07/11/2017   MSPIKE Not Observed 07/11/2017   SPEI Comment 07/11/2017     Chemistry      Component Value Date/Time   NA 141 07/25/2017 1136   NA 141 01/10/2017 1115   NA 140 06/21/2016 0918   K 3.5 07/25/2017 1136   K 4.0 01/10/2017 1115   K 4.3 06/21/2016 0918   CL 108 07/25/2017 1136   CL 106 01/10/2017 1115   CO2 27 07/25/2017 1136   CO2 27 01/10/2017 1115   CO2 20 (L) 06/21/2016 0918   BUN 14 07/25/2017 1136   BUN 15 01/10/2017 1115   BUN 15.8 06/21/2016 0918   CREATININE 0.90 07/25/2017 1136   CREATININE 1.0 01/10/2017 1115   CREATININE 0.8 06/21/2016 9811  Component Value Date/Time   CALCIUM 8.8 07/25/2017 1136   CALCIUM 9.5 01/10/2017 1115   CALCIUM 9.4 06/21/2016 0918   ALKPHOS 45 07/25/2017 1136   ALKPHOS 40 01/10/2017 1115   ALKPHOS 66 06/21/2016 0918   AST 21 07/25/2017 1136   AST 17 06/21/2016 0918   ALT 22 07/25/2017 1136   ALT 19 01/10/2017 1115   ALT 37 06/21/2016 0918   BILITOT 0.6 07/25/2017 1136   BILITOT 0.32 06/21/2016 0918      Impression and Plan: Ms. Arceo is a very pleasant 67 yo African American female with recurrent lambda light chain myeloma and also localized recurrent endometrial cancer (followed by gyn onc Dr. Nelly Rout). She continues to do well on Pomalyst and Kyprolis. We will proceed with Kyprolis treatment today as planned.  Myeloma blood work results for today are pending.  Colonoscopy is scheduled for later this week on Friday. We will see what her iron studies show and bring her back in again for infusion if needed.  We will continue to see her every 2 weeks for treatment with follow-up in 1 month.  She will contact our office with any questions or concerns. We can certainly see her sooner if needed.   Emeline Gins, NP 7/10/201911:35 AM

## 2017-08-09 LAB — PROTEIN ELECTROPHORESIS, SERUM
A/G Ratio: 1.7 (ref 0.7–1.7)
Albumin ELP: 3.9 g/dL (ref 2.9–4.4)
Alpha-1-Globulin: 0.2 g/dL (ref 0.0–0.4)
Alpha-2-Globulin: 0.8 g/dL (ref 0.4–1.0)
Beta Globulin: 1 g/dL (ref 0.7–1.3)
Gamma Globulin: 0.4 g/dL (ref 0.4–1.8)
Globulin, Total: 2.3 g/dL (ref 2.2–3.9)
Total Protein ELP: 6.2 g/dL (ref 6.0–8.5)

## 2017-08-09 LAB — IGG, IGA, IGM
IgA: 25 mg/dL — ABNORMAL LOW (ref 87–352)
IgG (Immunoglobin G), Serum: 408 mg/dL — ABNORMAL LOW (ref 700–1600)
IgM (Immunoglobulin M), Srm: 6 mg/dL — ABNORMAL LOW (ref 26–217)

## 2017-08-09 LAB — KAPPA/LAMBDA LIGHT CHAINS
Kappa free light chain: 4.7 mg/L (ref 3.3–19.4)
Kappa, lambda light chain ratio: 0.14 — ABNORMAL LOW (ref 0.26–1.65)
Lambda free light chains: 33.7 mg/L — ABNORMAL HIGH (ref 5.7–26.3)

## 2017-08-09 LAB — IRON AND TIBC
Iron: 85 ug/dL (ref 41–142)
Saturation Ratios: 22 % (ref 21–57)
TIBC: 380 ug/dL (ref 236–444)
UIBC: 295 ug/dL

## 2017-08-09 LAB — FERRITIN: Ferritin: 1324 ng/mL — ABNORMAL HIGH (ref 11–307)

## 2017-08-10 DIAGNOSIS — Z1211 Encounter for screening for malignant neoplasm of colon: Secondary | ICD-10-CM | POA: Diagnosis not present

## 2017-08-10 DIAGNOSIS — Z800 Family history of malignant neoplasm of digestive organs: Secondary | ICD-10-CM | POA: Diagnosis not present

## 2017-08-10 DIAGNOSIS — K5520 Angiodysplasia of colon without hemorrhage: Secondary | ICD-10-CM | POA: Diagnosis not present

## 2017-08-21 ENCOUNTER — Other Ambulatory Visit: Payer: Self-pay | Admitting: *Deleted

## 2017-08-21 DIAGNOSIS — C9000 Multiple myeloma not having achieved remission: Secondary | ICD-10-CM

## 2017-08-22 ENCOUNTER — Other Ambulatory Visit: Payer: Self-pay

## 2017-08-22 ENCOUNTER — Inpatient Hospital Stay: Payer: Medicare Other

## 2017-08-22 DIAGNOSIS — Z79899 Other long term (current) drug therapy: Secondary | ICD-10-CM | POA: Diagnosis not present

## 2017-08-22 DIAGNOSIS — C9000 Multiple myeloma not having achieved remission: Secondary | ICD-10-CM | POA: Diagnosis not present

## 2017-08-22 DIAGNOSIS — Z5112 Encounter for antineoplastic immunotherapy: Secondary | ICD-10-CM | POA: Diagnosis not present

## 2017-08-22 DIAGNOSIS — Z7982 Long term (current) use of aspirin: Secondary | ICD-10-CM | POA: Diagnosis not present

## 2017-08-22 DIAGNOSIS — C541 Malignant neoplasm of endometrium: Secondary | ICD-10-CM | POA: Diagnosis not present

## 2017-08-22 DIAGNOSIS — Z79811 Long term (current) use of aromatase inhibitors: Secondary | ICD-10-CM | POA: Diagnosis not present

## 2017-08-22 LAB — CBC WITH DIFFERENTIAL (CANCER CENTER ONLY)
Basophils Absolute: 0.1 10*3/uL (ref 0.0–0.1)
Basophils Relative: 3 %
Eosinophils Absolute: 0.1 10*3/uL (ref 0.0–0.5)
Eosinophils Relative: 2 %
HCT: 33.8 % — ABNORMAL LOW (ref 34.8–46.6)
Hemoglobin: 11 g/dL — ABNORMAL LOW (ref 11.6–15.9)
Lymphocytes Relative: 20 %
Lymphs Abs: 0.7 10*3/uL — ABNORMAL LOW (ref 0.9–3.3)
MCH: 27.7 pg (ref 26.0–34.0)
MCHC: 32.5 g/dL (ref 32.0–36.0)
MCV: 85.1 fL (ref 81.0–101.0)
Monocytes Absolute: 0.6 10*3/uL (ref 0.1–0.9)
Monocytes Relative: 16 %
Neutro Abs: 2.1 10*3/uL (ref 1.5–6.5)
Neutrophils Relative %: 59 %
Platelet Count: 203 10*3/uL (ref 145–400)
RBC: 3.97 MIL/uL (ref 3.70–5.32)
RDW: 17.7 % — ABNORMAL HIGH (ref 11.1–15.7)
WBC Count: 3.5 10*3/uL — ABNORMAL LOW (ref 3.9–10.0)

## 2017-08-22 LAB — CMP (CANCER CENTER ONLY)
ALT: 31 U/L (ref 10–47)
AST: 25 U/L (ref 11–38)
Albumin: 3.6 g/dL (ref 3.5–5.0)
Alkaline Phosphatase: 39 U/L (ref 26–84)
Anion gap: 9 (ref 5–15)
BUN: 15 mg/dL (ref 7–22)
CO2: 28 mmol/L (ref 18–33)
Calcium: 9.6 mg/dL (ref 8.0–10.3)
Chloride: 104 mmol/L (ref 98–108)
Creatinine: 0.9 mg/dL (ref 0.60–1.20)
Glucose, Bld: 91 mg/dL (ref 73–118)
Potassium: 3.6 mmol/L (ref 3.3–4.7)
Sodium: 141 mmol/L (ref 128–145)
Total Bilirubin: 0.6 mg/dL (ref 0.2–1.6)
Total Protein: 6.5 g/dL (ref 6.4–8.1)

## 2017-08-22 MED ORDER — SODIUM CHLORIDE 0.9% FLUSH
10.0000 mL | INTRAVENOUS | Status: DC | PRN
  Administered 2017-08-22: 10 mL
  Filled 2017-08-22: qty 10

## 2017-08-22 MED ORDER — DEXAMETHASONE SODIUM PHOSPHATE 100 MG/10ML IJ SOLN: Freq: Once | INTRAMUSCULAR | Status: DC

## 2017-08-22 MED ORDER — SODIUM CHLORIDE 0.9 % IV SOLN
Freq: Once | INTRAVENOUS | Status: AC
Start: 2017-08-22 — End: 2017-08-22
  Administered 2017-08-22: 10:00:00 via INTRAVENOUS
  Filled 2017-08-22: qty 250

## 2017-08-22 MED ORDER — HEPARIN SOD (PORK) LOCK FLUSH 100 UNIT/ML IV SOLN
500.0000 [IU] | Freq: Once | INTRAVENOUS | Status: AC | PRN
  Administered 2017-08-22: 500 [IU]
  Filled 2017-08-22: qty 5

## 2017-08-22 MED ORDER — DEXAMETHASONE SODIUM PHOSPHATE 10 MG/ML IJ SOLN
10.0000 mg | Freq: Once | INTRAMUSCULAR | Status: AC
  Administered 2017-08-22: 10 mg via INTRAVENOUS

## 2017-08-22 MED ORDER — CARFILZOMIB CHEMO INJECTION 60 MG
55.2000 mg/m2 | Freq: Once | INTRAVENOUS | Status: AC
  Administered 2017-08-22: 110 mg via INTRAVENOUS
  Filled 2017-08-22: qty 15

## 2017-08-22 MED ORDER — DEXAMETHASONE SODIUM PHOSPHATE 10 MG/ML IJ SOLN
INTRAMUSCULAR | Status: AC
  Filled 2017-08-22: qty 1

## 2017-08-22 MED ORDER — SODIUM CHLORIDE 0.9 % IV SOLN
Freq: Once | INTRAVENOUS | Status: DC
  Filled 2017-08-22: qty 250

## 2017-08-22 NOTE — Patient Instructions (Signed)
Carfilzomib injection What is this medicine? CARFILZOMIB (kar FILZ oh mib) targets a specific protein within cancer cells and stops the cancer cells from growing. It is used to treat multiple myeloma. This medicine may be used for other purposes; ask your health care provider or pharmacist if you have questions. COMMON BRAND NAME(S): KYPROLIS What should I tell my health care provider before I take this medicine? They need to know if you have any of these conditions: -heart disease -history of blood clots -irregular heartbeat -kidney disease -liver disease -lung or breathing disease -an unusual or allergic reaction to carfilzomib, or other medicines, foods, dyes, or preservatives -pregnant or trying to get pregnant -breast-feeding How should I use this medicine? This medicine is for injection or infusion into a vein. It is given by a health care professional in a hospital or clinic setting. Talk to your pediatrician regarding the use of this medicine in children. Special care may be needed. Overdosage: If you think you have taken too much of this medicine contact a poison control center or emergency room at once. NOTE: This medicine is only for you. Do not share this medicine with others. What if I miss a dose? It is important not to miss your dose. Call your doctor or health care professional if you are unable to keep an appointment. What may interact with this medicine? Interactions are not expected. Give your health care provider a list of all the medicines, herbs, non-prescription drugs, or dietary supplements you use. Also tell them if you smoke, drink alcohol, or use illegal drugs. Some items may interact with your medicine. This list may not describe all possible interactions. Give your health care provider a list of all the medicines, herbs, non-prescription drugs, or dietary supplements you use. Also tell them if you smoke, drink alcohol, or use illegal drugs. Some items may  interact with your medicine. What should I watch for while using this medicine? Your condition will be monitored carefully while you are receiving this medicine. Report any side effects. Continue your course of treatment even though you feel ill unless your doctor tells you to stop. You may need blood work done while you are taking this medicine. Do not become pregnant while taking this medicine or for at least 30 days after stopping it. Women should inform their doctor if they wish to become pregnant or think they might be pregnant. There is a potential for serious side effects to an unborn child. Men should not father a child while taking this medicine and for 90 days after stopping it. Talk to your health care professional or pharmacist for more information. Do not breast-feed an infant while taking this medicine. Check with your doctor or health care professional if you get an attack of severe diarrhea, nausea and vomiting, or if you sweat a lot. The loss of too much body fluid can make it dangerous for you to take this medicine. You may get dizzy. Do not drive, use machinery, or do anything that needs mental alertness until you know how this medicine affects you. Do not stand or sit up quickly, especially if you are an older patient. This reduces the risk of dizzy or fainting spells. What side effects may I notice from receiving this medicine? Side effects that you should report to your doctor or health care professional as soon as possible: -allergic reactions like skin rash, itching or hives, swelling of the face, lips, or tongue -confusion -dizziness -feeling faint or lightheaded -fever or chills -  palpitations -seizures -signs and symptoms of bleeding such as bloody or black, tarry stools; red or dark-brown urine; spitting up blood or brown material that looks like coffee grounds; red spots on the skin; unusual bruising or bleeding including from the eye, gums, or nose -signs and symptoms of  a blood clot such as breathing problems; changes in vision; chest pain; severe, sudden headache; pain, swelling, warmth in the leg; trouble speaking; sudden numbness or weakness of the face, arm or leg -signs and symptoms of kidney injury like trouble passing urine or change in the amount of urine -signs and symptoms of liver injury like dark yellow or brown urine; general ill feeling or flu-like symptoms; light-colored stools; loss of appetite; nausea; right upper belly pain; unusually weak or tired; yellowing of the eyes or skin Side effects that usually do not require medical attention (report to your doctor or health care professional if they continue or are bothersome): -back pain -cough -diarrhea -headache -muscle cramps -vomiting This list may not describe all possible side effects. Call your doctor for medical advice about side effects. You may report side effects to FDA at 1-800-FDA-1088. Where should I keep my medicine? This drug is given in a hospital or clinic and will not be stored at home. NOTE: This sheet is a summary. It may not cover all possible information. If you have questions about this medicine, talk to your doctor, pharmacist, or health care provider.  2018 Elsevier/Gold Standard (2015-02-18 13:39:23)  

## 2017-08-27 ENCOUNTER — Other Ambulatory Visit: Payer: Self-pay | Admitting: Hematology & Oncology

## 2017-08-27 DIAGNOSIS — C9001 Multiple myeloma in remission: Secondary | ICD-10-CM

## 2017-08-27 DIAGNOSIS — I10 Essential (primary) hypertension: Secondary | ICD-10-CM

## 2017-08-27 DIAGNOSIS — C9000 Multiple myeloma not having achieved remission: Secondary | ICD-10-CM

## 2017-09-04 ENCOUNTER — Other Ambulatory Visit: Payer: Self-pay | Admitting: *Deleted

## 2017-09-04 ENCOUNTER — Other Ambulatory Visit: Payer: Self-pay | Admitting: Hematology & Oncology

## 2017-09-04 DIAGNOSIS — C9000 Multiple myeloma not having achieved remission: Secondary | ICD-10-CM

## 2017-09-04 MED ORDER — POMALIDOMIDE 1 MG PO CAPS
ORAL_CAPSULE | ORAL | 0 refills | Status: DC
Start: 2017-09-04 — End: 2017-10-09

## 2017-09-06 DIAGNOSIS — E559 Vitamin D deficiency, unspecified: Secondary | ICD-10-CM | POA: Diagnosis not present

## 2017-09-06 DIAGNOSIS — C9000 Multiple myeloma not having achieved remission: Secondary | ICD-10-CM | POA: Diagnosis not present

## 2017-09-06 DIAGNOSIS — I10 Essential (primary) hypertension: Secondary | ICD-10-CM | POA: Diagnosis not present

## 2017-09-06 DIAGNOSIS — N3091 Cystitis, unspecified with hematuria: Secondary | ICD-10-CM | POA: Diagnosis not present

## 2017-09-12 ENCOUNTER — Inpatient Hospital Stay: Payer: Medicare Other | Admitting: Family

## 2017-09-12 ENCOUNTER — Inpatient Hospital Stay: Payer: Medicare Other

## 2017-09-17 ENCOUNTER — Other Ambulatory Visit: Payer: Self-pay | Admitting: Internal Medicine

## 2017-09-17 DIAGNOSIS — E2839 Other primary ovarian failure: Secondary | ICD-10-CM

## 2017-09-18 ENCOUNTER — Telehealth: Payer: Self-pay | Admitting: Pharmacist

## 2017-09-18 ENCOUNTER — Ambulatory Visit: Payer: Medicare Other | Admitting: Family

## 2017-09-18 ENCOUNTER — Inpatient Hospital Stay (HOSPITAL_BASED_OUTPATIENT_CLINIC_OR_DEPARTMENT_OTHER): Payer: Medicare Other | Admitting: Family

## 2017-09-18 ENCOUNTER — Inpatient Hospital Stay: Payer: Medicare Other

## 2017-09-18 ENCOUNTER — Ambulatory Visit: Payer: Medicare Other

## 2017-09-18 ENCOUNTER — Other Ambulatory Visit: Payer: Medicare Other

## 2017-09-18 ENCOUNTER — Inpatient Hospital Stay: Payer: Medicare Other | Attending: Hematology & Oncology

## 2017-09-18 ENCOUNTER — Other Ambulatory Visit: Payer: Self-pay

## 2017-09-18 VITALS — BP 120/71 | HR 82 | Temp 98.2°F | Resp 20 | Wt 192.5 lb

## 2017-09-18 DIAGNOSIS — D500 Iron deficiency anemia secondary to blood loss (chronic): Secondary | ICD-10-CM

## 2017-09-18 DIAGNOSIS — Z5112 Encounter for antineoplastic immunotherapy: Secondary | ICD-10-CM | POA: Diagnosis not present

## 2017-09-18 DIAGNOSIS — Z9484 Stem cells transplant status: Secondary | ICD-10-CM | POA: Insufficient documentation

## 2017-09-18 DIAGNOSIS — Z923 Personal history of irradiation: Secondary | ICD-10-CM

## 2017-09-18 DIAGNOSIS — Z8542 Personal history of malignant neoplasm of other parts of uterus: Secondary | ICD-10-CM | POA: Diagnosis not present

## 2017-09-18 DIAGNOSIS — C9000 Multiple myeloma not having achieved remission: Secondary | ICD-10-CM

## 2017-09-18 DIAGNOSIS — D508 Other iron deficiency anemias: Secondary | ICD-10-CM

## 2017-09-18 DIAGNOSIS — C9002 Multiple myeloma in relapse: Secondary | ICD-10-CM | POA: Diagnosis not present

## 2017-09-18 DIAGNOSIS — Z9221 Personal history of antineoplastic chemotherapy: Secondary | ICD-10-CM | POA: Diagnosis not present

## 2017-09-18 DIAGNOSIS — C541 Malignant neoplasm of endometrium: Secondary | ICD-10-CM

## 2017-09-18 DIAGNOSIS — C9001 Multiple myeloma in remission: Secondary | ICD-10-CM

## 2017-09-18 LAB — CBC WITH DIFFERENTIAL (CANCER CENTER ONLY)
Basophils Absolute: 0.1 10*3/uL (ref 0.0–0.1)
Basophils Relative: 2 %
Eosinophils Absolute: 0.2 10*3/uL (ref 0.0–0.5)
Eosinophils Relative: 6 %
HCT: 32.2 % — ABNORMAL LOW (ref 34.8–46.6)
Hemoglobin: 10.5 g/dL — ABNORMAL LOW (ref 11.6–15.9)
Lymphocytes Relative: 34 %
Lymphs Abs: 1.3 10*3/uL (ref 0.9–3.3)
MCH: 28 pg (ref 26.0–34.0)
MCHC: 32.6 g/dL (ref 32.0–36.0)
MCV: 85.9 fL (ref 81.0–101.0)
Monocytes Absolute: 0.7 10*3/uL (ref 0.1–0.9)
Monocytes Relative: 19 %
Neutro Abs: 1.5 10*3/uL (ref 1.5–6.5)
Neutrophils Relative %: 39 %
Platelet Count: 308 10*3/uL (ref 145–400)
RBC: 3.75 MIL/uL (ref 3.70–5.32)
RDW: 16.5 % — ABNORMAL HIGH (ref 11.1–15.7)
WBC Count: 3.7 10*3/uL — ABNORMAL LOW (ref 3.9–10.0)

## 2017-09-18 LAB — CMP (CANCER CENTER ONLY)
ALT: 21 U/L (ref 10–47)
AST: 20 U/L (ref 11–38)
Albumin: 3.9 g/dL (ref 3.5–5.0)
Alkaline Phosphatase: 45 U/L (ref 26–84)
Anion gap: 3 — ABNORMAL LOW (ref 5–15)
BUN: 19 mg/dL (ref 7–22)
CO2: 31 mmol/L (ref 18–33)
Calcium: 9.5 mg/dL (ref 8.0–10.3)
Chloride: 104 mmol/L (ref 98–108)
Creatinine: 1 mg/dL (ref 0.60–1.20)
Glucose, Bld: 96 mg/dL (ref 73–118)
Potassium: 3.7 mmol/L (ref 3.3–4.7)
Sodium: 138 mmol/L (ref 128–145)
Total Bilirubin: 0.6 mg/dL (ref 0.2–1.6)
Total Protein: 6.8 g/dL (ref 6.4–8.1)

## 2017-09-18 LAB — RETICULOCYTES
RBC.: 3.77 MIL/uL (ref 3.70–5.45)
Retic Count, Absolute: 98 10*3/uL — ABNORMAL HIGH (ref 33.7–90.7)
Retic Ct Pct: 2.6 % — ABNORMAL HIGH (ref 0.7–2.1)

## 2017-09-18 MED ORDER — SODIUM CHLORIDE 0.9 % IJ SOLN
10.0000 mL | Freq: Once | INTRAMUSCULAR | Status: AC
  Administered 2017-09-18: 10 mL
  Filled 2017-09-18: qty 10

## 2017-09-18 MED ORDER — DEXAMETHASONE SODIUM PHOSPHATE 10 MG/ML IJ SOLN
10.0000 mg | Freq: Once | INTRAMUSCULAR | Status: AC
  Administered 2017-09-18: 10 mg via INTRAVENOUS

## 2017-09-18 MED ORDER — SODIUM CHLORIDE 0.9 % IV SOLN
Freq: Once | INTRAVENOUS | Status: DC
  Filled 2017-09-18: qty 8

## 2017-09-18 MED ORDER — SODIUM CHLORIDE 0.9% FLUSH
10.0000 mL | INTRAVENOUS | Status: DC | PRN
  Filled 2017-09-18: qty 10

## 2017-09-18 MED ORDER — SODIUM CHLORIDE 0.9 % IV SOLN
Freq: Once | INTRAVENOUS | Status: AC
  Administered 2017-09-18: 12:00:00 via INTRAVENOUS
  Filled 2017-09-18: qty 250

## 2017-09-18 MED ORDER — HEPARIN SOD (PORK) LOCK FLUSH 100 UNIT/ML IV SOLN
500.0000 [IU] | Freq: Once | INTRAVENOUS | Status: DC | PRN
  Filled 2017-09-18: qty 5

## 2017-09-18 MED ORDER — DEXAMETHASONE SODIUM PHOSPHATE 10 MG/ML IJ SOLN
INTRAMUSCULAR | Status: AC
  Filled 2017-09-18: qty 1

## 2017-09-18 MED ORDER — DEXTROSE 5 % IV SOLN
55.0000 mg/m2 | Freq: Once | INTRAVENOUS | Status: AC
  Administered 2017-09-18: 110 mg via INTRAVENOUS
  Filled 2017-09-18: qty 45

## 2017-09-18 MED ORDER — DENOSUMAB 120 MG/1.7ML ~~LOC~~ SOLN
SUBCUTANEOUS | Status: AC
  Filled 2017-09-18: qty 1.7

## 2017-09-18 MED ORDER — DENOSUMAB 120 MG/1.7ML ~~LOC~~ SOLN
120.0000 mg | Freq: Once | SUBCUTANEOUS | Status: AC
  Administered 2017-09-18: 120 mg via SUBCUTANEOUS

## 2017-09-18 NOTE — Patient Instructions (Signed)
Carfilzomib injection What is this medicine? CARFILZOMIB (kar FILZ oh mib) targets a specific protein within cancer cells and stops the cancer cells from growing. It is used to treat multiple myeloma. This medicine may be used for other purposes; ask your health care provider or pharmacist if you have questions. COMMON BRAND NAME(S): KYPROLIS What should I tell my health care provider before I take this medicine? They need to know if you have any of these conditions: -heart disease -history of blood clots -irregular heartbeat -kidney disease -liver disease -lung or breathing disease -an unusual or allergic reaction to carfilzomib, or other medicines, foods, dyes, or preservatives -pregnant or trying to get pregnant -breast-feeding How should I use this medicine? This medicine is for injection or infusion into a vein. It is given by a health care professional in a hospital or clinic setting. Talk to your pediatrician regarding the use of this medicine in children. Special care may be needed. Overdosage: If you think you have taken too much of this medicine contact a poison control center or emergency room at once. NOTE: This medicine is only for you. Do not share this medicine with others. What if I miss a dose? It is important not to miss your dose. Call your doctor or health care professional if you are unable to keep an appointment. What may interact with this medicine? Interactions are not expected. Give your health care provider a list of all the medicines, herbs, non-prescription drugs, or dietary supplements you use. Also tell them if you smoke, drink alcohol, or use illegal drugs. Some items may interact with your medicine. This list may not describe all possible interactions. Give your health care provider a list of all the medicines, herbs, non-prescription drugs, or dietary supplements you use. Also tell them if you smoke, drink alcohol, or use illegal drugs. Some items may  interact with your medicine. What should I watch for while using this medicine? Your condition will be monitored carefully while you are receiving this medicine. Report any side effects. Continue your course of treatment even though you feel ill unless your doctor tells you to stop. You may need blood work done while you are taking this medicine. Do not become pregnant while taking this medicine or for at least 30 days after stopping it. Women should inform their doctor if they wish to become pregnant or think they might be pregnant. There is a potential for serious side effects to an unborn child. Men should not father a child while taking this medicine and for 90 days after stopping it. Talk to your health care professional or pharmacist for more information. Do not breast-feed an infant while taking this medicine. Check with your doctor or health care professional if you get an attack of severe diarrhea, nausea and vomiting, or if you sweat a lot. The loss of too much body fluid can make it dangerous for you to take this medicine. You may get dizzy. Do not drive, use machinery, or do anything that needs mental alertness until you know how this medicine affects you. Do not stand or sit up quickly, especially if you are an older patient. This reduces the risk of dizzy or fainting spells. What side effects may I notice from receiving this medicine? Side effects that you should report to your doctor or health care professional as soon as possible: -allergic reactions like skin rash, itching or hives, swelling of the face, lips, or tongue -confusion -dizziness -feeling faint or lightheaded -fever or chills -  palpitations -seizures -signs and symptoms of bleeding such as bloody or black, tarry stools; red or dark-brown urine; spitting up blood or brown material that looks like coffee grounds; red spots on the skin; unusual bruising or bleeding including from the eye, gums, or nose -signs and symptoms of  a blood clot such as breathing problems; changes in vision; chest pain; severe, sudden headache; pain, swelling, warmth in the leg; trouble speaking; sudden numbness or weakness of the face, arm or leg -signs and symptoms of kidney injury like trouble passing urine or change in the amount of urine -signs and symptoms of liver injury like dark yellow or brown urine; general ill feeling or flu-like symptoms; light-colored stools; loss of appetite; nausea; right upper belly pain; unusually weak or tired; yellowing of the eyes or skin Side effects that usually do not require medical attention (report to your doctor or health care professional if they continue or are bothersome): -back pain -cough -diarrhea -headache -muscle cramps -vomiting This list may not describe all possible side effects. Call your doctor for medical advice about side effects. You may report side effects to FDA at 1-800-FDA-1088. Where should I keep my medicine? This drug is given in a hospital or clinic and will not be stored at home. NOTE: This sheet is a summary. It may not cover all possible information. If you have questions about this medicine, talk to your doctor, pharmacist, or health care provider.  2018 Elsevier/Gold Standard (2015-02-18 13:39:23)  

## 2017-09-18 NOTE — Addendum Note (Signed)
Addended by: Perlie Gold on: 09/18/2017 10:44 AM   Modules accepted: Orders

## 2017-09-18 NOTE — Progress Notes (Signed)
Hematology and Oncology Follow Up Visit  Emily Greer [id number] [birth year] 67 y.o. 09/18/2017   Principle Diagnosis:  Recurrent lambda light chain myeloma History of recurrent endometrial carcinoma  Past Therapy:             Status post second autologous stem cell transplant on 07/24/2014 Maintenance therapy with Pomalidomide/every 2 week Velcade - d/c'ed Xgeva 120 mg subcutaneous every 3months -next dose inApril 2019 Radiation therapy for endometrial recurrence - completed 04/20/2015  Current Therapy:   Pomalyst/Kyprolis 70mg /m2 IV q 2 weeks - s/p cycle9 Femara 2.5 mg po q day    Interim History:  Emily Greer is here today for follow-up and treatment. She was diagnosed with a UTI last week and has finished 7 days of Macrobid. She is feeling much better but still has some mild fatigue.  She verbalized that she continues to taker her Femara and Pomalyst as prescribed.  In July, no M-spike observed and lambda light chain 3.37 md/dL.  No fever, chills, n/v, cough, rash, dizziness, SOB, chest pain, palpitations, abdominal pain or changes in bowel or bladder habits.  No falls or syncopal episodes.  No lymphadenopathy noted on exam. She has had no episodes of bleeding, no bruising or petechiae.  No swelling, tenderness, numbness or tingling in her extremities.  She has maintained a good appetite and is staying well hydrated. Her weight is stable.   ECOG Performance Status: 1 - Symptomatic but completely ambulatory  Medications:  Allergies as of 09/18/2017      Reactions   Codeine Nausea Only      Medication List        Accurate as of 09/18/17 12:09 PM. Always use your most recent med list.          acyclovir 400 MG tablet Commonly known as:  ZOVIRAX Take 1 tablet (400 mg total) by mouth daily.   albuterol 108 (90 Base) MCG/ACT inhaler Commonly known as:  PROVENTIL HFA;VENTOLIN HFA Inhale 2 puffs into the lungs every 6 (six) hours as needed for wheezing or  shortness of breath. 2 puffs 3 times daily x 5 days then every 6 hours as needed.   amLODipine 10 MG tablet Commonly known as:  NORVASC TAKE 1 TABLET BY MOUTH DAILY--- takes in am   aspirin EC 81 MG tablet Take 81 mg by mouth 2 (two) times daily.   granisetron 3.1 MG/24HR Commonly known as:  SANCUSO Apply to skin starting 24 hours before chemotherapy. Remove after 7 days.   letrozole 2.5 MG tablet Commonly known as:  FEMARA Take 1 tablet (2.5 mg total) by mouth daily.   lidocaine-prilocaine cream Commonly known as:  EMLA Apply to affected area once   loperamide 2 MG capsule Commonly known as:  IMODIUM Take by mouth as needed for diarrhea or loose stools. Reported on 08/19/2015   loratadine 10 MG tablet Commonly known as:  CLARITIN Take 10 mg by mouth every morning. Reported on 08/19/2015   NASACORT ALLERGY 24HR 55 MCG/ACT Aero nasal inhaler Generic drug:  triamcinolone Place 2 sprays into the nose daily.   NOREL AD 4-10-325 MG Tabs Generic drug:  Chlorphen-PE-Acetaminophen Take by mouth as needed.   ondansetron 8 MG tablet Commonly known as:  ZOFRAN Take 1 tablet (8 mg total) by mouth 2 (two) times daily as needed (Nausea or vomiting).   pomalidomide 1 MG capsule Commonly known as:  POMALYST TAKE 1 CAPSULE BY MOUTH ONCE DAILY FOR 21 DAYS ON AND 7 DAYS OFF Auth# [id number]  potassium chloride SA 20 MEQ tablet Commonly known as:  K-DUR,KLOR-CON TAKE 1 TABLET(20 MEQ) BY MOUTH TWICE DAILY   prochlorperazine 10 MG tablet Commonly known as:  COMPAZINE Take 1 tablet (10 mg total) by mouth every 6 (six) hours as needed (Nausea or vomiting).   triamterene-hydrochlorothiazide 37.5-25 MG tablet Commonly known as:  MAXZIDE-25 TK 1 T PO QD   Vitamin D3 2000 units Tabs Take 2 tablets by mouth daily.       Allergies:  Allergies  Allergen Reactions  . Codeine Nausea Only    Past Medical History, Surgical history, Social history, and Family History were reviewed and  updated.  Review of Systems: All other 10 point review of systems is negative.   Physical Exam:  weight is 192 lb 8 oz (87.3 kg). Her oral temperature is 98.2 F (36.8 C). Her blood pressure is 120/71 and her pulse is 82. Her respiration is 20 and oxygen saturation is 99%.   Wt Readings from Last 3 Encounters:  09/18/17 192 lb 8 oz (87.3 kg)  08/08/17 195 lb 1.9 oz (88.5 kg)  07/11/17 192 lb (87.1 kg)    Ocular: Sclerae unicteric, pupils equal, round and reactive to light Ear-nose-throat: Oropharynx clear, dentition fair Lymphatic: No cervical, supraclavicular or axillary adenopathy Lungs no rales or rhonchi, good excursion bilaterally Heart regular rate and rhythm, no murmur appreciated Abd soft, nontender, positive bowel sounds, no liver or spleen tip palpated on exam, no fluid wave MSK no focal spinal tenderness, no joint edema Neuro: non-focal, well-oriented, appropriate affect Breasts: Deferred   Lab Results  Component Value Date   WBC 3.7 (L) 09/18/2017   HGB 10.5 (L) 09/18/2017   HCT 32.2 (L) 09/18/2017   MCV 85.9 09/18/2017   PLT 308 09/18/2017   Lab Results  Component Value Date   FERRITIN 1,324 (H) 08/08/2017   IRON 85 08/08/2017   TIBC 380 08/08/2017   UIBC 295 08/08/2017   IRONPCTSAT 22 08/08/2017   Lab Results  Component Value Date   RETICCTPCT 0.6 12/31/2014   RBC 3.75 09/18/2017   Lab Results  Component Value Date   KPAFRELGTCHN 4.7 08/08/2017   LAMBDASER 33.7 (H) 08/08/2017   KAPLAMBRATIO 0.14 (L) 08/08/2017   Lab Results  Component Value Date   IGGSERUM 408 (L) 08/08/2017   IGA 25 (L) 08/08/2017   IGMSERUM 6 (L) 08/08/2017   Lab Results  Component Value Date   TOTALPROTELP 6.2 08/08/2017   ALBUMINELP 3.9 08/08/2017   A1GS 0.2 08/08/2017   A2GS 0.8 08/08/2017   BETS 1.0 08/08/2017   BETA2SER 0.4 11/23/2014   GAMS 0.4 08/08/2017   MSPIKE Not Observed 08/08/2017   SPEI Comment 08/08/2017     Chemistry      Component Value Date/Time    NA 138 09/18/2017 1023   NA 141 01/10/2017 1115   NA 140 06/21/2016 0918   K 3.7 09/18/2017 1023   K 4.0 01/10/2017 1115   K 4.3 06/21/2016 0918   CL 104 09/18/2017 1023   CL 106 01/10/2017 1115   CO2 31 09/18/2017 1023   CO2 27 01/10/2017 1115   CO2 20 (L) 06/21/2016 0918   BUN 19 09/18/2017 1023   BUN 15 01/10/2017 1115   BUN 15.8 06/21/2016 0918   CREATININE 1.00 09/18/2017 1023   CREATININE 1.0 01/10/2017 1115   CREATININE 0.8 06/21/2016 0918      Component Value Date/Time   CALCIUM 9.5 09/18/2017 1023   CALCIUM 9.5 01/10/2017 1115   CALCIUM  9.4 06/21/2016 0918   ALKPHOS 45 09/18/2017 1023   ALKPHOS 40 01/10/2017 1115   ALKPHOS 66 06/21/2016 0918   AST 20 09/18/2017 1023   AST 17 06/21/2016 0918   ALT 21 09/18/2017 1023   ALT 19 01/10/2017 1115   ALT 37 06/21/2016 0918   BILITOT 0.6 09/18/2017 1023   BILITOT 0.32 06/21/2016 0918      Impression and Plan: Emily Greer is a very pleasant 67 yo African American female with recurrent lambda light chain myeloma and also localized recurrent endometrial cancer (followed by gyn onc Dr. Nelly Rout). She is tolerating treatment nicely and has no complaints at this time.  We will proceed with treatment today as planned.  We will plan to see her back every 2 weeks for treatment with follow-up in 1 month.  She will contact our office with any questions or concerns. We can certainly see her sooner if need be.   Emeline Gins, NP 8/20/201912:09 PM

## 2017-09-18 NOTE — Patient Instructions (Signed)
Implanted Port Insertion, Care After °This sheet gives you information about how to care for yourself after your procedure. Your health care provider may also give you more specific instructions. If you have problems or questions, contact your health care provider. °What can I expect after the procedure? °After your procedure, it is common to have: °· Discomfort at the port insertion site. °· Bruising on the skin over the port. This should improve over 3-4 days. ° °Follow these instructions at home: °Port care °· After your port is placed, you will get a manufacturer's information card. The card has information about your port. Keep this card with you at all times. °· Take care of the port as told by your health care provider. Ask your health care provider if you or a family member can get training for taking care of the port at home. A home health care nurse may also take care of the port. °· Make sure to remember what type of port you have. °Incision care °· Follow instructions from your health care provider about how to take care of your port insertion site. Make sure you: °? Wash your hands with soap and water before you change your bandage (dressing). If soap and water are not available, use hand sanitizer. °? Change your dressing as told by your health care provider. °? Leave stitches (sutures), skin glue, or adhesive strips in place. These skin closures may need to stay in place for 2 weeks or longer. If adhesive strip edges start to loosen and curl up, you may trim the loose edges. Do not remove adhesive strips completely unless your health care provider tells you to do that. °· Check your port insertion site every day for signs of infection. Check for: °? More redness, swelling, or pain. °? More fluid or blood. °? Warmth. °? Pus or a bad smell. °General instructions °· Do not take baths, swim, or use a hot tub until your health care provider approves. °· Do not lift anything that is heavier than 10 lb (4.5  kg) for a week, or as told by your health care provider. °· Ask your health care provider when it is okay to: °? Return to work or school. °? Resume usual physical activities or sports. °· Do not drive for 24 hours if you were given a medicine to help you relax (sedative). °· Take over-the-counter and prescription medicines only as told by your health care provider. °· Wear a medical alert bracelet in case of an emergency. This will tell any health care providers that you have a port. °· Keep all follow-up visits as told by your health care provider. This is important. °Contact a health care provider if: °· You cannot flush your port with saline as directed, or you cannot draw blood from the port. °· You have a fever or chills. °· You have more redness, swelling, or pain around your port insertion site. °· You have more fluid or blood coming from your port insertion site. °· Your port insertion site feels warm to the touch. °· You have pus or a bad smell coming from the port insertion site. °Get help right away if: °· You have chest pain or shortness of breath. °· You have bleeding from your port that you cannot control. °Summary °· Take care of the port as told by your health care provider. °· Change your dressing as told by your health care provider. °· Keep all follow-up visits as told by your health care provider. °  This information is not intended to replace advice given to you by your health care provider. Make sure you discuss any questions you have with your health care provider. °Document Released: 11/06/2012 Document Revised: 12/08/2015 Document Reviewed: 12/08/2015 °Elsevier Interactive Patient Education © 2017 Elsevier Inc[employer] ° °

## 2017-09-18 NOTE — Telephone Encounter (Signed)
Samples given:  Femara 2.5 mg      1 bottle/30 tabs Lot:  [id number] Exp:  9/19

## 2017-09-19 LAB — PROTEIN ELECTROPHORESIS, SERUM
A/G Ratio: 1.6 (ref 0.7–1.7)
Albumin ELP: 3.9 g/dL (ref 2.9–4.4)
Alpha-1-Globulin: 0.2 g/dL (ref 0.0–0.4)
Alpha-2-Globulin: 0.8 g/dL (ref 0.4–1.0)
Beta Globulin: 0.9 g/dL (ref 0.7–1.3)
Gamma Globulin: 0.6 g/dL (ref 0.4–1.8)
Globulin, Total: 2.5 g/dL (ref 2.2–3.9)
Total Protein ELP: 6.4 g/dL (ref 6.0–8.5)

## 2017-09-19 LAB — KAPPA/LAMBDA LIGHT CHAINS
Kappa free light chain: 11.8 mg/L (ref 3.3–19.4)
Kappa, lambda light chain ratio: 0.22 — ABNORMAL LOW (ref 0.26–1.65)
Lambda free light chains: 54.5 mg/L — ABNORMAL HIGH (ref 5.7–26.3)

## 2017-09-19 LAB — IRON AND TIBC
Iron: 92 ug/dL (ref 41–142)
Saturation Ratios: 24 % (ref 21–57)
TIBC: 381 ug/dL (ref 236–444)
UIBC: 289 ug/dL

## 2017-09-19 LAB — IGG, IGA, IGM
IgA: 81 mg/dL — ABNORMAL LOW (ref 87–352)
IgG (Immunoglobin G), Serum: 713 mg/dL (ref 700–1600)
IgM (Immunoglobulin M), Srm: 7 mg/dL — ABNORMAL LOW (ref 26–217)

## 2017-09-19 LAB — FERRITIN: Ferritin: 746 ng/mL — ABNORMAL HIGH (ref 11–307)

## 2017-09-26 ENCOUNTER — Other Ambulatory Visit: Payer: BC Managed Care – PPO

## 2017-09-26 ENCOUNTER — Ambulatory Visit: Payer: BC Managed Care – PPO

## 2017-09-27 ENCOUNTER — Other Ambulatory Visit: Payer: Self-pay | Admitting: Internal Medicine

## 2017-09-27 DIAGNOSIS — Z1231 Encounter for screening mammogram for malignant neoplasm of breast: Secondary | ICD-10-CM

## 2017-10-03 ENCOUNTER — Inpatient Hospital Stay: Payer: Medicare Other

## 2017-10-03 ENCOUNTER — Inpatient Hospital Stay: Payer: Medicare Other | Attending: Hematology & Oncology

## 2017-10-03 ENCOUNTER — Other Ambulatory Visit: Payer: Self-pay

## 2017-10-03 VITALS — BP 132/69 | HR 78 | Temp 98.4°F | Resp 18

## 2017-10-03 DIAGNOSIS — C9000 Multiple myeloma not having achieved remission: Secondary | ICD-10-CM | POA: Diagnosis not present

## 2017-10-03 DIAGNOSIS — Z7982 Long term (current) use of aspirin: Secondary | ICD-10-CM | POA: Insufficient documentation

## 2017-10-03 DIAGNOSIS — Z8542 Personal history of malignant neoplasm of other parts of uterus: Secondary | ICD-10-CM | POA: Diagnosis not present

## 2017-10-03 DIAGNOSIS — Z79899 Other long term (current) drug therapy: Secondary | ICD-10-CM | POA: Diagnosis not present

## 2017-10-03 DIAGNOSIS — Z5112 Encounter for antineoplastic immunotherapy: Secondary | ICD-10-CM | POA: Diagnosis not present

## 2017-10-03 LAB — CBC WITH DIFFERENTIAL (CANCER CENTER ONLY)
Basophils Absolute: 0.1 10*3/uL (ref 0.0–0.1)
Basophils Relative: 3 %
Eosinophils Absolute: 0.1 10*3/uL (ref 0.0–0.5)
Eosinophils Relative: 4 %
HCT: 32.5 % — ABNORMAL LOW (ref 34.8–46.6)
Hemoglobin: 10.5 g/dL — ABNORMAL LOW (ref 11.6–15.9)
Lymphocytes Relative: 20 %
Lymphs Abs: 0.7 10*3/uL — ABNORMAL LOW (ref 0.9–3.3)
MCH: 28.1 pg (ref 26.0–34.0)
MCHC: 32.3 g/dL (ref 32.0–36.0)
MCV: 86.9 fL (ref 81.0–101.0)
Monocytes Absolute: 0.3 10*3/uL (ref 0.1–0.9)
Monocytes Relative: 8 %
Neutro Abs: 2.4 10*3/uL (ref 1.5–6.5)
Neutrophils Relative %: 65 %
Platelet Count: 240 10*3/uL (ref 145–400)
RBC: 3.74 MIL/uL (ref 3.70–5.32)
RDW: 16.4 % — ABNORMAL HIGH (ref 11.1–15.7)
WBC Count: 3.7 10*3/uL — ABNORMAL LOW (ref 3.9–10.0)

## 2017-10-03 LAB — CMP (CANCER CENTER ONLY)
ALT: 24 U/L (ref 10–47)
AST: 20 U/L (ref 11–38)
Albumin: 3.9 g/dL (ref 3.5–5.0)
Alkaline Phosphatase: 34 U/L (ref 26–84)
Anion gap: 6 (ref 5–15)
BUN: 15 mg/dL (ref 7–22)
CO2: 28 mmol/L (ref 18–33)
Calcium: 9.5 mg/dL (ref 8.0–10.3)
Chloride: 106 mmol/L (ref 98–108)
Creatinine: 1 mg/dL (ref 0.60–1.20)
Glucose, Bld: 97 mg/dL (ref 73–118)
Potassium: 3.9 mmol/L (ref 3.3–4.7)
Sodium: 140 mmol/L (ref 128–145)
Total Bilirubin: 0.6 mg/dL (ref 0.2–1.6)
Total Protein: 6.6 g/dL (ref 6.4–8.1)

## 2017-10-03 MED ORDER — DEXAMETHASONE SODIUM PHOSPHATE 10 MG/ML IJ SOLN
10.0000 mg | Freq: Once | INTRAMUSCULAR | Status: AC
  Administered 2017-10-03: 10 mg via INTRAVENOUS

## 2017-10-03 MED ORDER — SODIUM CHLORIDE 0.9 % IV SOLN
Freq: Once | INTRAVENOUS | Status: AC
  Administered 2017-10-03: 13:00:00 via INTRAVENOUS
  Filled 2017-10-03: qty 250

## 2017-10-03 MED ORDER — SODIUM CHLORIDE 0.9 % IV SOLN: Freq: Once | INTRAVENOUS | Status: DC

## 2017-10-03 MED ORDER — HEPARIN SOD (PORK) LOCK FLUSH 100 UNIT/ML IV SOLN
500.0000 [IU] | Freq: Once | INTRAVENOUS | Status: AC | PRN
  Administered 2017-10-03: 500 [IU]
  Filled 2017-10-03: qty 5

## 2017-10-03 MED ORDER — SODIUM CHLORIDE 0.9% FLUSH
10.0000 mL | INTRAVENOUS | Status: DC | PRN
  Administered 2017-10-03: 10 mL
  Filled 2017-10-03: qty 10

## 2017-10-03 MED ORDER — SODIUM CHLORIDE 0.9 % IV SOLN
Freq: Once | INTRAVENOUS | Status: DC
  Filled 2017-10-03: qty 250

## 2017-10-03 MED ORDER — DEXTROSE 5 % IV SOLN
55.0000 mg/m2 | Freq: Once | INTRAVENOUS | Status: AC
  Administered 2017-10-03: 110 mg via INTRAVENOUS
  Filled 2017-10-03: qty 30

## 2017-10-03 MED ORDER — DEXAMETHASONE SODIUM PHOSPHATE 10 MG/ML IJ SOLN
INTRAMUSCULAR | Status: AC
  Filled 2017-10-03: qty 1

## 2017-10-03 NOTE — Patient Instructions (Signed)
Carfilzomib injection What is this medicine? CARFILZOMIB (kar FILZ oh mib) targets a specific protein within cancer cells and stops the cancer cells from growing. It is used to treat multiple myeloma. This medicine may be used for other purposes; ask your health care provider or pharmacist if you have questions. COMMON BRAND NAME(S): KYPROLIS What should I tell my health care provider before I take this medicine? They need to know if you have any of these conditions: -heart disease -history of blood clots -irregular heartbeat -kidney disease -liver disease -lung or breathing disease -an unusual or allergic reaction to carfilzomib, or other medicines, foods, dyes, or preservatives -pregnant or trying to get pregnant -breast-feeding How should I use this medicine? This medicine is for injection or infusion into a vein. It is given by a health care professional in a hospital or clinic setting. Talk to your pediatrician regarding the use of this medicine in children. Special care may be needed. Overdosage: If you think you have taken too much of this medicine contact a poison control center or emergency room at once. NOTE: This medicine is only for you. Do not share this medicine with others. What if I miss a dose? It is important not to miss your dose. Call your doctor or health care professional if you are unable to keep an appointment. What may interact with this medicine? Interactions are not expected. Give your health care provider a list of all the medicines, herbs, non-prescription drugs, or dietary supplements you use. Also tell them if you smoke, drink alcohol, or use illegal drugs. Some items may interact with your medicine. This list may not describe all possible interactions. Give your health care provider a list of all the medicines, herbs, non-prescription drugs, or dietary supplements you use. Also tell them if you smoke, drink alcohol, or use illegal drugs. Some items may  interact with your medicine. What should I watch for while using this medicine? Your condition will be monitored carefully while you are receiving this medicine. Report any side effects. Continue your course of treatment even though you feel ill unless your doctor tells you to stop. You may need blood work done while you are taking this medicine. Do not become pregnant while taking this medicine or for at least 30 days after stopping it. Women should inform their doctor if they wish to become pregnant or think they might be pregnant. There is a potential for serious side effects to an unborn child. Men should not father a child while taking this medicine and for 90 days after stopping it. Talk to your health care professional or pharmacist for more information. Do not breast-feed an infant while taking this medicine. Check with your doctor or health care professional if you get an attack of severe diarrhea, nausea and vomiting, or if you sweat a lot. The loss of too much body fluid can make it dangerous for you to take this medicine. You may get dizzy. Do not drive, use machinery, or do anything that needs mental alertness until you know how this medicine affects you. Do not stand or sit up quickly, especially if you are an older patient. This reduces the risk of dizzy or fainting spells. What side effects may I notice from receiving this medicine? Side effects that you should report to your doctor or health care professional as soon as possible: -allergic reactions like skin rash, itching or hives, swelling of the face, lips, or tongue -confusion -dizziness -feeling faint or lightheaded -fever or chills -  palpitations -seizures -signs and symptoms of bleeding such as bloody or black, tarry stools; red or dark-brown urine; spitting up blood or brown material that looks like coffee grounds; red spots on the skin; unusual bruising or bleeding including from the eye, gums, or nose -signs and symptoms of  a blood clot such as breathing problems; changes in vision; chest pain; severe, sudden headache; pain, swelling, warmth in the leg; trouble speaking; sudden numbness or weakness of the face, arm or leg -signs and symptoms of kidney injury like trouble passing urine or change in the amount of urine -signs and symptoms of liver injury like dark yellow or brown urine; general ill feeling or flu-like symptoms; light-colored stools; loss of appetite; nausea; right upper belly pain; unusually weak or tired; yellowing of the eyes or skin Side effects that usually do not require medical attention (report to your doctor or health care professional if they continue or are bothersome): -back pain -cough -diarrhea -headache -muscle cramps -vomiting This list may not describe all possible side effects. Call your doctor for medical advice about side effects. You may report side effects to FDA at 1-800-FDA-1088. Where should I keep my medicine? This drug is given in a hospital or clinic and will not be stored at home. NOTE: This sheet is a summary. It may not cover all possible information. If you have questions about this medicine, talk to your doctor, pharmacist, or health care provider.  2018 Elsevier/Gold Standard (2015-02-18 13:39:23)  

## 2017-10-09 ENCOUNTER — Other Ambulatory Visit: Payer: Self-pay | Admitting: *Deleted

## 2017-10-09 ENCOUNTER — Other Ambulatory Visit: Payer: Self-pay | Admitting: Hematology & Oncology

## 2017-10-09 DIAGNOSIS — R319 Hematuria, unspecified: Secondary | ICD-10-CM | POA: Diagnosis not present

## 2017-10-09 DIAGNOSIS — C9000 Multiple myeloma not having achieved remission: Secondary | ICD-10-CM

## 2017-10-09 DIAGNOSIS — I10 Essential (primary) hypertension: Secondary | ICD-10-CM | POA: Diagnosis not present

## 2017-10-09 MED ORDER — POMALIDOMIDE 1 MG PO CAPS: ORAL_CAPSULE | ORAL | 0 refills | Status: DC

## 2017-10-10 ENCOUNTER — Ambulatory Visit: Payer: BC Managed Care – PPO

## 2017-10-10 ENCOUNTER — Other Ambulatory Visit: Payer: BC Managed Care – PPO

## 2017-10-16 ENCOUNTER — Other Ambulatory Visit: Payer: Self-pay | Admitting: *Deleted

## 2017-10-16 ENCOUNTER — Other Ambulatory Visit: Payer: Self-pay | Admitting: Hematology & Oncology

## 2017-10-16 DIAGNOSIS — Z9481 Bone marrow transplant status: Secondary | ICD-10-CM | POA: Diagnosis not present

## 2017-10-16 DIAGNOSIS — C9000 Multiple myeloma not having achieved remission: Secondary | ICD-10-CM | POA: Diagnosis not present

## 2017-10-16 DIAGNOSIS — C9001 Multiple myeloma in remission: Secondary | ICD-10-CM

## 2017-10-16 DIAGNOSIS — I10 Essential (primary) hypertension: Secondary | ICD-10-CM

## 2017-10-16 DIAGNOSIS — M25511 Pain in right shoulder: Secondary | ICD-10-CM | POA: Diagnosis not present

## 2017-10-17 ENCOUNTER — Inpatient Hospital Stay: Payer: Medicare Other

## 2017-10-17 ENCOUNTER — Inpatient Hospital Stay (HOSPITAL_BASED_OUTPATIENT_CLINIC_OR_DEPARTMENT_OTHER): Payer: Medicare Other | Admitting: Hematology & Oncology

## 2017-10-17 ENCOUNTER — Other Ambulatory Visit: Payer: Self-pay

## 2017-10-17 ENCOUNTER — Encounter: Payer: Self-pay | Admitting: Hematology & Oncology

## 2017-10-17 VITALS — BP 149/69 | HR 113 | Temp 99.8°F | Resp 20 | Wt 195.0 lb

## 2017-10-17 DIAGNOSIS — Z79899 Other long term (current) drug therapy: Secondary | ICD-10-CM | POA: Diagnosis not present

## 2017-10-17 DIAGNOSIS — Z7982 Long term (current) use of aspirin: Secondary | ICD-10-CM | POA: Diagnosis not present

## 2017-10-17 DIAGNOSIS — C9001 Multiple myeloma in remission: Secondary | ICD-10-CM

## 2017-10-17 DIAGNOSIS — C9000 Multiple myeloma not having achieved remission: Secondary | ICD-10-CM

## 2017-10-17 DIAGNOSIS — Z8542 Personal history of malignant neoplasm of other parts of uterus: Secondary | ICD-10-CM

## 2017-10-17 DIAGNOSIS — Z5112 Encounter for antineoplastic immunotherapy: Secondary | ICD-10-CM | POA: Diagnosis not present

## 2017-10-17 LAB — CMP (CANCER CENTER ONLY)
ALT: 27 U/L (ref 10–47)
AST: 21 U/L (ref 11–38)
Albumin: 3.9 g/dL (ref 3.5–5.0)
Alkaline Phosphatase: 42 U/L (ref 26–84)
Anion gap: 6 (ref 5–15)
BUN: 10 mg/dL (ref 7–22)
CO2: 27 mmol/L (ref 18–33)
Calcium: 9.4 mg/dL (ref 8.0–10.3)
Chloride: 107 mmol/L (ref 98–108)
Creatinine: 0.7 mg/dL (ref 0.60–1.20)
Glucose, Bld: 101 mg/dL (ref 73–118)
Potassium: 3.3 mmol/L (ref 3.3–4.7)
Sodium: 140 mmol/L (ref 128–145)
Total Bilirubin: 0.7 mg/dL (ref 0.2–1.6)
Total Protein: 6.9 g/dL (ref 6.4–8.1)

## 2017-10-17 LAB — CBC WITH DIFFERENTIAL (CANCER CENTER ONLY)
Basophils Absolute: 0.1 10*3/uL (ref 0.0–0.1)
Basophils Relative: 1 %
Eosinophils Absolute: 0.2 10*3/uL (ref 0.0–0.5)
Eosinophils Relative: 2 %
HCT: 31.9 % — ABNORMAL LOW (ref 34.8–46.6)
Hemoglobin: 10.5 g/dL — ABNORMAL LOW (ref 11.6–15.9)
Lymphocytes Relative: 13 %
Lymphs Abs: 0.9 10*3/uL (ref 0.9–3.3)
MCH: 28.5 pg (ref 26.0–34.0)
MCHC: 32.9 g/dL (ref 32.0–36.0)
MCV: 86.7 fL (ref 81.0–101.0)
Monocytes Absolute: 0.7 10*3/uL (ref 0.1–0.9)
Monocytes Relative: 10 %
Neutro Abs: 5.2 10*3/uL (ref 1.5–6.5)
Neutrophils Relative %: 74 %
Platelet Count: 233 10*3/uL (ref 145–400)
RBC: 3.68 MIL/uL — ABNORMAL LOW (ref 3.70–5.32)
RDW: 16.1 % — ABNORMAL HIGH (ref 11.1–15.7)
WBC Count: 7.1 10*3/uL (ref 3.9–10.0)

## 2017-10-17 LAB — RETICULOCYTES
RBC.: 3.75 MIL/uL (ref 3.70–5.45)
Retic Count, Absolute: 108.8 10*3/uL — ABNORMAL HIGH (ref 33.7–90.7)
Retic Ct Pct: 2.9 % — ABNORMAL HIGH (ref 0.7–2.1)

## 2017-10-17 MED ORDER — SODIUM CHLORIDE 0.9 % IV SOLN: Freq: Once | INTRAVENOUS | Status: DC

## 2017-10-17 MED ORDER — DEXTROSE 5 % IV SOLN
55.0000 mg/m2 | Freq: Once | INTRAVENOUS | Status: AC
  Administered 2017-10-17: 110 mg via INTRAVENOUS
  Filled 2017-10-17: qty 30

## 2017-10-17 MED ORDER — ALTEPLASE 2 MG IJ SOLR
2.0000 mg | Freq: Once | INTRAMUSCULAR | Status: DC | PRN
  Filled 2017-10-17: qty 2

## 2017-10-17 MED ORDER — HEPARIN SOD (PORK) LOCK FLUSH 100 UNIT/ML IV SOLN
250.0000 [IU] | Freq: Once | INTRAVENOUS | Status: DC | PRN
  Filled 2017-10-17: qty 5

## 2017-10-17 MED ORDER — SODIUM CHLORIDE 0.9 % IV SOLN
Freq: Once | INTRAVENOUS | Status: AC
  Administered 2017-10-17: 13:00:00 via INTRAVENOUS
  Filled 2017-10-17: qty 250

## 2017-10-17 MED ORDER — SODIUM CHLORIDE 0.9 % IV SOLN
Freq: Once | INTRAVENOUS | Status: DC
  Filled 2017-10-17: qty 250

## 2017-10-17 MED ORDER — SODIUM CHLORIDE 0.9% FLUSH
10.0000 mL | INTRAVENOUS | Status: DC | PRN
  Administered 2017-10-17: 10 mL
  Filled 2017-10-17: qty 10

## 2017-10-17 MED ORDER — DEXAMETHASONE SODIUM PHOSPHATE 10 MG/ML IJ SOLN
INTRAMUSCULAR | Status: AC
  Filled 2017-10-17: qty 1

## 2017-10-17 MED ORDER — HEPARIN SOD (PORK) LOCK FLUSH 100 UNIT/ML IV SOLN
500.0000 [IU] | Freq: Once | INTRAVENOUS | Status: AC | PRN
  Administered 2017-10-17: 500 [IU]
  Filled 2017-10-17: qty 5

## 2017-10-17 MED ORDER — DEXAMETHASONE SODIUM PHOSPHATE 10 MG/ML IJ SOLN
10.0000 mg | Freq: Once | INTRAMUSCULAR | Status: AC
  Administered 2017-10-17: 10 mg via INTRAVENOUS

## 2017-10-17 NOTE — Progress Notes (Signed)
Hematology and Oncology Follow Up Visit  AMRITA NUCKOLLS [id number] [birth year] 67 y.o. 10/17/2017   Principle Diagnosis:  Recurrent lambda light chain myeloma History of recurrent endometrial carcinoma  Past Therapy:             Status post second autologous stem cell transplant on 07/24/2014 Maintenance therapy with Pomalidomide/every 2 week Velcade - d/c'ed Xgeva 120 mg subcutaneous every 3months -next dose inApril 2019 Radiation therapy for endometrial recurrence - completed 04/20/2015  Current Therapy:   Pomalyst/Kyprolis 70mg /m2 IV q 2 weeks - s/p cycle9 Femara 2.5 mg po q day    Interim History:  Ms. Farfan is here today for follow-up and treatment.  She is doing okay.  She has had a busy summer.  She and her husband have had to go down to [place] quite often as their parents are down there and their parents are quite elderly.  She has 1 grandchild in [place].  I do worry that her light chains are going up slowly.  Back in May her lambda light chain was 2.6 mg/dL.  In July the lambda light chain was 3.4 mg/dL.  And in August it was up to 5.5 mg/dL.  We certainly have other options that we can look at with her.  We can increase the pomalidomide.  We could switch her out to 1 of the new monoclonal antibodies.  Ultimately, I suspect that we probably will have to look at CAR-T therapy.      She has had no problems with pain.  She has had no problems with cough or shortness of breath.  There has been no nausea or vomiting.  There has been no bleeding.  She has had no change in bowel or bladder habits.  Her uterine cancer has not been an issue.  She is followed up by radiation oncology for this.  Currently, her performance status is ECOG 1.   Allergies as of 10/17/2017      Reactions   Codeine Nausea Only      Medication List        Accurate as of 10/17/17 12:43 PM. Always use your most recent med list.          acyclovir 400 MG tablet Commonly  known as:  ZOVIRAX Take 1 tablet (400 mg total) by mouth daily.   albuterol 108 (90 Base) MCG/ACT inhaler Commonly known as:  PROVENTIL HFA;VENTOLIN HFA Inhale 2 puffs into the lungs every 6 (six) hours as needed for wheezing or shortness of breath. 2 puffs 3 times daily x 5 days then every 6 hours as needed.   amLODipine 10 MG tablet Commonly known as:  NORVASC TAKE 1 TABLET BY MOUTH DAILY--- takes in am   aspirin EC 81 MG tablet Take 81 mg by mouth 2 (two) times daily.   furosemide 20 MG tablet Commonly known as:  LASIX daily.   granisetron 3.1 MG/24HR Commonly known as:  SANCUSO Apply to skin starting 24 hours before chemotherapy. Remove after 7 days.   letrozole 2.5 MG tablet Commonly known as:  FEMARA Take 1 tablet (2.5 mg total) by mouth daily.   lidocaine-prilocaine cream Commonly known as:  EMLA Apply to affected area once   loperamide 2 MG capsule Commonly known as:  IMODIUM Take by mouth as needed for diarrhea or loose stools. Reported on 08/19/2015   loratadine 10 MG tablet Commonly known as:  CLARITIN Take 10 mg by mouth every morning. Reported on 08/19/2015   metoprolol succinate  50 MG 24 hr tablet Commonly known as:  TOPROL-XL metoprolol succinate ER 50 mg tablet,extended release 24 hr   NASACORT ALLERGY 24HR 55 MCG/ACT Aero nasal inhaler Generic drug:  triamcinolone Place 2 sprays into the nose daily.   nitrofurantoin (macrocrystal-monohydrate) 100 MG capsule Commonly known as:  MACROBID TK 1 C PO Q 12 H WF   NOREL AD 4-10-325 MG Tabs Generic drug:  Chlorphen-PE-Acetaminophen Take by mouth as needed.   ondansetron 8 MG tablet Commonly known as:  ZOFRAN Take 1 tablet (8 mg total) by mouth 2 (two) times daily as needed (Nausea or vomiting).   pomalidomide 1 MG capsule Commonly known as:  POMALYST Take with water on days 1-21. Repeat every 28 days. AUTH [id number]   potassium chloride SA 20 MEQ tablet Commonly known as:  K-DUR,KLOR-CON TAKE 1  TABLET(20 MEQ) BY MOUTH TWICE DAILY   prochlorperazine 10 MG tablet Commonly known as:  COMPAZINE Take 1 tablet (10 mg total) by mouth every 6 (six) hours as needed (Nausea or vomiting).   triamterene-hydrochlorothiazide 37.5-25 MG tablet Commonly known as:  MAXZIDE-25 TK 1 T PO QD   Vitamin D3 2000 units Tabs Take 2 tablets by mouth daily.       Allergies:  Allergies  Allergen Reactions  . Codeine Nausea Only    Past Medical History, Surgical history, Social history, and Family History were reviewed and updated.  Review of Systems: Review of Systems  Constitutional: Negative.   HENT: Negative.   Eyes: Negative.   Respiratory: Negative.   Cardiovascular: Negative.   Gastrointestinal: Negative.   Genitourinary: Negative.   Musculoskeletal: Negative.   Skin: Negative.   Neurological: Negative.   Endo/Heme/Allergies: Negative.   Psychiatric/Behavioral: Negative.      Physical Exam:  weight is 195 lb (88.5 kg). Her oral temperature is 99.8 F (37.7 C). Her blood pressure is 149/69 (abnormal) and her pulse is 113 (abnormal). Her respiration is 20 and oxygen saturation is 99%.   Wt Readings from Last 3 Encounters:  10/17/17 195 lb (88.5 kg)  09/18/17 192 lb 8 oz (87.3 kg)  08/08/17 195 lb 1.9 oz (88.5 kg)    Physical Exam  Constitutional: She is oriented to person, place, and time.  HENT:  Head: Normocephalic and atraumatic.  Mouth/Throat: Oropharynx is clear and moist.  Eyes: Pupils are equal, round, and reactive to light. EOM are normal.  Neck: Normal range of motion.  Cardiovascular: Normal rate, regular rhythm and normal heart sounds.  Pulmonary/Chest: Effort normal and breath sounds normal.  Abdominal: Soft. Bowel sounds are normal.  Musculoskeletal: Normal range of motion. She exhibits no edema, tenderness or deformity.  Lymphadenopathy:    She has no cervical adenopathy.  Neurological: She is alert and oriented to person, place, and time.  Skin: Skin  is warm and dry. No rash noted. No erythema.  Psychiatric: She has a normal mood and affect. Her behavior is normal. Judgment and thought content normal.  Vitals reviewed.    Lab Results  Component Value Date   WBC 7.1 10/17/2017   HGB 10.5 (L) 10/17/2017   HCT 31.9 (L) 10/17/2017   MCV 86.7 10/17/2017   PLT 233 10/17/2017   Lab Results  Component Value Date   FERRITIN 746 (H) 09/18/2017   IRON 92 09/18/2017   TIBC 381 09/18/2017   UIBC 289 09/18/2017   IRONPCTSAT 24 09/18/2017   Lab Results  Component Value Date   RETICCTPCT 2.6 (H) 09/18/2017   RBC 3.68 (L) 10/17/2017  Lab Results  Component Value Date   KPAFRELGTCHN 11.8 09/18/2017   LAMBDASER 54.5 (H) 09/18/2017   KAPLAMBRATIO 0.22 (L) 09/18/2017   Lab Results  Component Value Date   IGGSERUM 713 09/18/2017   IGA 81 (L) 09/18/2017   IGMSERUM 7 (L) 09/18/2017   Lab Results  Component Value Date   TOTALPROTELP 6.4 09/18/2017   ALBUMINELP 3.9 09/18/2017   A1GS 0.2 09/18/2017   A2GS 0.8 09/18/2017   BETS 0.9 09/18/2017   BETA2SER 0.4 11/23/2014   GAMS 0.6 09/18/2017   MSPIKE Not Observed 09/18/2017   SPEI Comment 09/18/2017     Chemistry      Component Value Date/Time   NA 140 10/03/2017 1200   NA 141 01/10/2017 1115   NA 140 06/21/2016 0918   K 3.9 10/03/2017 1200   K 4.0 01/10/2017 1115   K 4.3 06/21/2016 0918   CL 106 10/03/2017 1200   CL 106 01/10/2017 1115   CO2 28 10/03/2017 1200   CO2 27 01/10/2017 1115   CO2 20 (L) 06/21/2016 0918   BUN 15 10/03/2017 1200   BUN 15 01/10/2017 1115   BUN 15.8 06/21/2016 0918   CREATININE 1.00 10/03/2017 1200   CREATININE 1.0 01/10/2017 1115   CREATININE 0.8 06/21/2016 0918      Component Value Date/Time   CALCIUM 9.5 10/03/2017 1200   CALCIUM 9.5 01/10/2017 1115   CALCIUM 9.4 06/21/2016 0918   ALKPHOS 34 10/03/2017 1200   ALKPHOS 40 01/10/2017 1115   ALKPHOS 66 06/21/2016 0918   AST 20 10/03/2017 1200   AST 17 06/21/2016 0918   ALT 24 10/03/2017  1200   ALT 19 01/10/2017 1115   ALT 37 06/21/2016 0918   BILITOT 0.6 10/03/2017 1200   BILITOT 0.32 06/21/2016 0918      Impression and Plan: Ms. Rourke is a very pleasant 67 yo African American female with recurrent lambda light chain myeloma and also localized recurrent endometrial cancer (followed by gyn onc Dr. Nelly Rout).   Her second stem cell transplantation was back in June 2016.  Again, the light chains will tell us what is going on.  We may have to do a 24-hour urine on her to see how this looks.  I think her last one was about 8 months ago.  Again, if she does have a light chain that is further elevated, we will have to make a change.  I may have to think about 1 of the monoclonal antibodies.  I will plan to get her back to see me in another couple weeks.     Josph Macho, MD 9/18/201912:43 PM

## 2017-10-17 NOTE — Addendum Note (Signed)
Addended by: Burney Gauze R on: 10/17/2017 01:12 PM   Modules accepted: Orders

## 2017-10-17 NOTE — Progress Notes (Signed)
Pt. Is wearing sancuso patch.

## 2017-10-17 NOTE — Patient Instructions (Signed)
Carfilzomib injection What is this medicine? CARFILZOMIB (kar FILZ oh mib) targets a specific protein within cancer cells and stops the cancer cells from growing. It is used to treat multiple myeloma. This medicine may be used for other purposes; ask your health care provider or pharmacist if you have questions. COMMON BRAND NAME(S): KYPROLIS What should I tell my health care provider before I take this medicine? They need to know if you have any of these conditions: -heart disease -history of blood clots -irregular heartbeat -kidney disease -liver disease -lung or breathing disease -an unusual or allergic reaction to carfilzomib, or other medicines, foods, dyes, or preservatives -pregnant or trying to get pregnant -breast-feeding How should I use this medicine? This medicine is for injection or infusion into a vein. It is given by a health care professional in a hospital or clinic setting. Talk to your pediatrician regarding the use of this medicine in children. Special care may be needed. Overdosage: If you think you have taken too much of this medicine contact a poison control center or emergency room at once. NOTE: This medicine is only for you. Do not share this medicine with others. What if I miss a dose? It is important not to miss your dose. Call your doctor or health care professional if you are unable to keep an appointment. What may interact with this medicine? Interactions are not expected. Give your health care provider a list of all the medicines, herbs, non-prescription drugs, or dietary supplements you use. Also tell them if you smoke, drink alcohol, or use illegal drugs. Some items may interact with your medicine. This list may not describe all possible interactions. Give your health care provider a list of all the medicines, herbs, non-prescription drugs, or dietary supplements you use. Also tell them if you smoke, drink alcohol, or use illegal drugs. Some items may  interact with your medicine. What should I watch for while using this medicine? Your condition will be monitored carefully while you are receiving this medicine. Report any side effects. Continue your course of treatment even though you feel ill unless your doctor tells you to stop. You may need blood work done while you are taking this medicine. Do not become pregnant while taking this medicine or for at least 30 days after stopping it. Women should inform their doctor if they wish to become pregnant or think they might be pregnant. There is a potential for serious side effects to an unborn child. Men should not father a child while taking this medicine and for 90 days after stopping it. Talk to your health care professional or pharmacist for more information. Do not breast-feed an infant while taking this medicine. Check with your doctor or health care professional if you get an attack of severe diarrhea, nausea and vomiting, or if you sweat a lot. The loss of too much body fluid can make it dangerous for you to take this medicine. You may get dizzy. Do not drive, use machinery, or do anything that needs mental alertness until you know how this medicine affects you. Do not stand or sit up quickly, especially if you are an older patient. This reduces the risk of dizzy or fainting spells. What side effects may I notice from receiving this medicine? Side effects that you should report to your doctor or health care professional as soon as possible: -allergic reactions like skin rash, itching or hives, swelling of the face, lips, or tongue -confusion -dizziness -feeling faint or lightheaded -fever or chills -  palpitations -seizures -signs and symptoms of bleeding such as bloody or black, tarry stools; red or dark-brown urine; spitting up blood or brown material that looks like coffee grounds; red spots on the skin; unusual bruising or bleeding including from the eye, gums, or nose -signs and symptoms of  a blood clot such as breathing problems; changes in vision; chest pain; severe, sudden headache; pain, swelling, warmth in the leg; trouble speaking; sudden numbness or weakness of the face, arm or leg -signs and symptoms of kidney injury like trouble passing urine or change in the amount of urine -signs and symptoms of liver injury like dark yellow or brown urine; general ill feeling or flu-like symptoms; light-colored stools; loss of appetite; nausea; right upper belly pain; unusually weak or tired; yellowing of the eyes or skin Side effects that usually do not require medical attention (report to your doctor or health care professional if they continue or are bothersome): -back pain -cough -diarrhea -headache -muscle cramps -vomiting This list may not describe all possible side effects. Call your doctor for medical advice about side effects. You may report side effects to FDA at 1-800-FDA-1088. Where should I keep my medicine? This drug is given in a hospital or clinic and will not be stored at home. NOTE: This sheet is a summary. It may not cover all possible information. If you have questions about this medicine, talk to your doctor, pharmacist, or health care provider.  2018 Elsevier/Gold Standard (2015-02-18 13:39:23)  

## 2017-10-18 LAB — KAPPA/LAMBDA LIGHT CHAINS
Kappa free light chain: 4.3 mg/L (ref 3.3–19.4)
Kappa, lambda light chain ratio: 0.1 — ABNORMAL LOW (ref 0.26–1.65)
Lambda free light chains: 44.4 mg/L — ABNORMAL HIGH (ref 5.7–26.3)

## 2017-10-18 LAB — IGG, IGA, IGM
IgA: 40 mg/dL — ABNORMAL LOW (ref 87–352)
IgG (Immunoglobin G), Serum: 520 mg/dL — ABNORMAL LOW (ref 700–1600)
IgM (Immunoglobulin M), Srm: 5 mg/dL — ABNORMAL LOW (ref 26–217)

## 2017-10-18 LAB — IRON AND TIBC
Iron: 34 ug/dL — ABNORMAL LOW (ref 41–142)
Saturation Ratios: 9 % — ABNORMAL LOW (ref 21–57)
TIBC: 381 ug/dL (ref 236–444)
UIBC: 347 ug/dL

## 2017-10-18 LAB — FERRITIN: Ferritin: 574 ng/mL — ABNORMAL HIGH (ref 11–307)

## 2017-10-18 LAB — PROTEIN ELECTROPHORESIS, SERUM, WITH REFLEX
A/G Ratio: 1.4 (ref 0.7–1.7)
Albumin ELP: 3.6 g/dL (ref 2.9–4.4)
Alpha-1-Globulin: 0.2 g/dL (ref 0.0–0.4)
Alpha-2-Globulin: 0.8 g/dL (ref 0.4–1.0)
Beta Globulin: 1 g/dL (ref 0.7–1.3)
Gamma Globulin: 0.5 g/dL (ref 0.4–1.8)
Globulin, Total: 2.6 g/dL (ref 2.2–3.9)
Total Protein ELP: 6.2 g/dL (ref 6.0–8.5)

## 2017-10-19 ENCOUNTER — Telehealth: Payer: Self-pay | Admitting: *Deleted

## 2017-10-19 NOTE — Telephone Encounter (Signed)
-----   Message from [name], MD sent at 10/18/2017  6:18 PM EDT ----- Call - the light chain level has come down a little bit!!  This is good!!  No need to change our protocol!!  The iron is very low!!  She needs IV iron with next chemo appt!!  [name]

## 2017-10-19 NOTE — Telephone Encounter (Signed)
Pt notified per order of Dr. Marin Olp that the light chain level have come down a little bit, there is no need to change protocol at this time, the iron level is very low and she will need IV iron with next chemo appt.  Pt.'s schedule reviewed with pt.  Patient appreciative of call and has no questions or concerns at this time.

## 2017-10-31 ENCOUNTER — Other Ambulatory Visit: Payer: BC Managed Care – PPO

## 2017-10-31 ENCOUNTER — Other Ambulatory Visit: Payer: Self-pay

## 2017-10-31 ENCOUNTER — Ambulatory Visit: Payer: BC Managed Care – PPO | Admitting: Hematology & Oncology

## 2017-10-31 ENCOUNTER — Ambulatory Visit: Payer: BC Managed Care – PPO

## 2017-10-31 ENCOUNTER — Inpatient Hospital Stay (HOSPITAL_BASED_OUTPATIENT_CLINIC_OR_DEPARTMENT_OTHER): Payer: Medicare Other | Admitting: Hematology & Oncology

## 2017-10-31 ENCOUNTER — Inpatient Hospital Stay: Payer: Medicare Other | Attending: Hematology & Oncology

## 2017-10-31 ENCOUNTER — Inpatient Hospital Stay: Payer: Medicare Other

## 2017-10-31 VITALS — BP 158/75 | HR 77 | Temp 98.3°F | Resp 20 | Wt 196.1 lb

## 2017-10-31 DIAGNOSIS — C541 Malignant neoplasm of endometrium: Secondary | ICD-10-CM

## 2017-10-31 DIAGNOSIS — Z23 Encounter for immunization: Secondary | ICD-10-CM | POA: Diagnosis not present

## 2017-10-31 DIAGNOSIS — Z923 Personal history of irradiation: Secondary | ICD-10-CM | POA: Insufficient documentation

## 2017-10-31 DIAGNOSIS — R5383 Other fatigue: Secondary | ICD-10-CM | POA: Diagnosis not present

## 2017-10-31 DIAGNOSIS — C9000 Multiple myeloma not having achieved remission: Secondary | ICD-10-CM | POA: Diagnosis not present

## 2017-10-31 DIAGNOSIS — Z79899 Other long term (current) drug therapy: Secondary | ICD-10-CM | POA: Diagnosis not present

## 2017-10-31 DIAGNOSIS — D509 Iron deficiency anemia, unspecified: Secondary | ICD-10-CM

## 2017-10-31 DIAGNOSIS — D508 Other iron deficiency anemias: Secondary | ICD-10-CM

## 2017-10-31 DIAGNOSIS — Z5112 Encounter for antineoplastic immunotherapy: Secondary | ICD-10-CM | POA: Insufficient documentation

## 2017-10-31 DIAGNOSIS — Z79811 Long term (current) use of aromatase inhibitors: Secondary | ICD-10-CM | POA: Insufficient documentation

## 2017-10-31 DIAGNOSIS — Z7982 Long term (current) use of aspirin: Secondary | ICD-10-CM

## 2017-10-31 DIAGNOSIS — C9001 Multiple myeloma in remission: Secondary | ICD-10-CM

## 2017-10-31 DIAGNOSIS — D500 Iron deficiency anemia secondary to blood loss (chronic): Secondary | ICD-10-CM

## 2017-10-31 LAB — CMP (CANCER CENTER ONLY)
ALT: 24 U/L (ref 10–47)
AST: 19 U/L (ref 11–38)
Albumin: 4.1 g/dL (ref 3.5–5.0)
Alkaline Phosphatase: 43 U/L (ref 26–84)
Anion gap: 0 — ABNORMAL LOW (ref 5–15)
BUN: 12 mg/dL (ref 7–22)
CO2: 26 mmol/L (ref 18–33)
Calcium: 9.4 mg/dL (ref 8.0–10.3)
Chloride: 111 mmol/L — ABNORMAL HIGH (ref 98–108)
Creatinine: 0.8 mg/dL (ref 0.60–1.20)
Glucose, Bld: 99 mg/dL (ref 73–118)
Potassium: 3.4 mmol/L (ref 3.3–4.7)
Sodium: 137 mmol/L (ref 128–145)
Total Bilirubin: 0.6 mg/dL (ref 0.2–1.6)
Total Protein: 6.5 g/dL (ref 6.4–8.1)

## 2017-10-31 LAB — CBC WITH DIFFERENTIAL (CANCER CENTER ONLY)
Basophils Absolute: 0.1 10*3/uL (ref 0.0–0.1)
Basophils Relative: 2 %
Eosinophils Absolute: 0.1 10*3/uL (ref 0.0–0.5)
Eosinophils Relative: 3 %
HCT: 31.7 % — ABNORMAL LOW (ref 34.8–46.6)
Hemoglobin: 10.2 g/dL — ABNORMAL LOW (ref 11.6–15.9)
Lymphocytes Relative: 19 %
Lymphs Abs: 0.8 10*3/uL — ABNORMAL LOW (ref 0.9–3.3)
MCH: 27.9 pg (ref 26.0–34.0)
MCHC: 32.2 g/dL (ref 32.0–36.0)
MCV: 86.8 fL (ref 81.0–101.0)
Monocytes Absolute: 0.5 10*3/uL (ref 0.1–0.9)
Monocytes Relative: 11 %
Neutro Abs: 2.7 10*3/uL (ref 1.5–6.5)
Neutrophils Relative %: 65 %
Platelet Count: 294 10*3/uL (ref 145–400)
RBC: 3.65 MIL/uL — ABNORMAL LOW (ref 3.70–5.32)
RDW: 16.6 % — ABNORMAL HIGH (ref 11.1–15.7)
WBC Count: 4.2 10*3/uL (ref 3.9–10.0)

## 2017-10-31 LAB — LACTATE DEHYDROGENASE: LDH: 182 U/L (ref 98–192)

## 2017-10-31 MED ORDER — HEPARIN SOD (PORK) LOCK FLUSH 100 UNIT/ML IV SOLN
500.0000 [IU] | Freq: Once | INTRAVENOUS | Status: AC | PRN
  Administered 2017-10-31: 500 [IU]
  Filled 2017-10-31: qty 5

## 2017-10-31 MED ORDER — SODIUM CHLORIDE 0.9 % IV SOLN
Freq: Once | INTRAVENOUS | Status: AC
  Administered 2017-10-31: 14:00:00 via INTRAVENOUS
  Filled 2017-10-31: qty 8

## 2017-10-31 MED ORDER — SODIUM CHLORIDE 0.9 % IV SOLN
Freq: Once | INTRAVENOUS | Status: AC
  Administered 2017-10-31: 13:00:00 via INTRAVENOUS
  Filled 2017-10-31: qty 250

## 2017-10-31 MED ORDER — INFLUENZA VAC SPLIT QUAD 0.5 ML IM SUSY
PREFILLED_SYRINGE | INTRAMUSCULAR | Status: AC
  Filled 2017-10-31: qty 0.5

## 2017-10-31 MED ORDER — SODIUM CHLORIDE 0.9 % IV SOLN
510.0000 mg | Freq: Once | INTRAVENOUS | Status: AC
  Administered 2017-10-31: 510 mg via INTRAVENOUS
  Filled 2017-10-31: qty 17

## 2017-10-31 MED ORDER — DEXTROSE 5 % IV SOLN
55.0000 mg/m2 | Freq: Once | INTRAVENOUS | Status: AC
  Administered 2017-10-31: 110 mg via INTRAVENOUS
  Filled 2017-10-31: qty 30

## 2017-10-31 MED ORDER — INFLUENZA VAC SPLIT QUAD 0.5 ML IM SUSY
0.5000 mL | PREFILLED_SYRINGE | Freq: Once | INTRAMUSCULAR | Status: AC
  Administered 2017-10-31: 0.5 mL via INTRAMUSCULAR

## 2017-10-31 MED ORDER — SODIUM CHLORIDE 0.9 % IV SOLN
Freq: Once | INTRAVENOUS | Status: AC
  Filled 2017-10-31: qty 250

## 2017-10-31 MED ORDER — SODIUM CHLORIDE 0.9% FLUSH
10.0000 mL | INTRAVENOUS | Status: DC | PRN
  Administered 2017-10-31: 10 mL
  Filled 2017-10-31: qty 10

## 2017-10-31 NOTE — Progress Notes (Signed)
Hematology and Oncology Follow Up Visit  Emily Greer [id number] [birth year] 67 y.o. 10/31/2017   Principle Diagnosis:  Recurrent lambda light chain myeloma History of recurrent endometrial carcinoma Iron deficiency anemia -blood loss  Past Therapy:             Status post second autologous stem cell transplant on 07/24/2014 Maintenance therapy with Pomalidomide/every 2 week Velcade - d/c'ed Xgeva 120 mg subcutaneous every 3months -next dose inNovember 2019 Radiation therapy for endometrial recurrence - completed 04/20/2015  Current Therapy:   Pomalyst/Kyprolis 70mg /m2 IV q 2 weeks - s/p cycle9 Femara 2.5 mg po q day  IV iron as indicated-dose of Feraheme given on 10/31/2017   Interim History:  Emily Greer is here today for follow-up and treatment.  She is doing okay.  She is tired.  She and her husband got back from [place] last night.  They go to the quite often for their elderly parents.  She has to go back down there as soon as her mother needs to have cataract surgery.  Thankfully, her last lambda light chain was down.  It was down to 4.4 mg/dL.  As such, we are continuing the pomalidomide/carfilzomib.  She is had no problems with the endometrial cancer.  She is on letrozole for this.  She is iron deficient.  Her iron levels back in September showed a ferritin of 574 with iron saturation of only 9%.  She has had no obvious change in bowel or bladder habits.  There is been no bleeding.  She is troubled that her weight is not going down.  Her husband comes with her.  He seems to think that she is doing fairly well.    Currently, her performance status is ECOG 1.   Allergies as of 10/31/2017      Reactions   Codeine Nausea Only      Medication List        Accurate as of 10/31/17  1:20 PM. Always use your most recent med list.          acyclovir 400 MG tablet Commonly known as:  ZOVIRAX Take 1 tablet (400 mg total) by mouth daily.   albuterol 108  (90 Base) MCG/ACT inhaler Commonly known as:  PROVENTIL HFA;VENTOLIN HFA Inhale 2 puffs into the lungs every 6 (six) hours as needed for wheezing or shortness of breath. 2 puffs 3 times daily x 5 days then every 6 hours as needed.   amLODipine 10 MG tablet Commonly known as:  NORVASC TAKE 1 TABLET BY MOUTH DAILY--- takes in am   aspirin EC 81 MG tablet Take 81 mg by mouth 2 (two) times daily.   furosemide 20 MG tablet Commonly known as:  LASIX daily.   granisetron 3.1 MG/24HR Commonly known as:  SANCUSO Apply to skin starting 24 hours before chemotherapy. Remove after 7 days.   letrozole 2.5 MG tablet Commonly known as:  FEMARA Take 1 tablet (2.5 mg total) by mouth daily.   lidocaine-prilocaine cream Commonly known as:  EMLA Apply to affected area once   loperamide 2 MG capsule Commonly known as:  IMODIUM Take by mouth as needed for diarrhea or loose stools. Reported on 08/19/2015   loratadine 10 MG tablet Commonly known as:  CLARITIN Take 10 mg by mouth every morning. Reported on 08/19/2015   metoprolol succinate 50 MG 24 hr tablet Commonly known as:  TOPROL-XL metoprolol succinate ER 50 mg tablet,extended release 24 hr   NASACORT ALLERGY 24HR 55 MCG/ACT Aero  nasal inhaler Generic drug:  triamcinolone Place 2 sprays into the nose daily.   nitrofurantoin (macrocrystal-monohydrate) 100 MG capsule Commonly known as:  MACROBID TK 1 C PO Q 12 H WF   NOREL AD 4-10-325 MG Tabs Generic drug:  Chlorphen-PE-Acetaminophen Take by mouth as needed.   ondansetron 8 MG tablet Commonly known as:  ZOFRAN Take 1 tablet (8 mg total) by mouth 2 (two) times daily as needed (Nausea or vomiting).   pomalidomide 1 MG capsule Commonly known as:  POMALYST Take with water on days 1-21. Repeat every 28 days. AUTH [id number]   potassium chloride SA 20 MEQ tablet Commonly known as:  K-DUR,KLOR-CON TAKE 1 TABLET(20 MEQ) BY MOUTH TWICE DAILY   prochlorperazine 10 MG tablet Commonly known  as:  COMPAZINE Take 1 tablet (10 mg total) by mouth every 6 (six) hours as needed (Nausea or vomiting).   triamterene-hydrochlorothiazide 37.5-25 MG tablet Commonly known as:  MAXZIDE-25 TK 1 T PO QD   Vitamin D3 2000 units Tabs Take 2 tablets by mouth daily.       Allergies:  Allergies  Allergen Reactions  . Codeine Nausea Only    Past Medical History, Surgical history, Social history, and Family History were reviewed and updated.  Review of Systems: Review of Systems  Constitutional: Negative.   HENT: Negative.   Eyes: Negative.   Respiratory: Negative.   Cardiovascular: Negative.   Gastrointestinal: Negative.   Genitourinary: Negative.   Musculoskeletal: Negative.   Skin: Negative.   Neurological: Negative.   Endo/Heme/Allergies: Negative.   Psychiatric/Behavioral: Negative.      Physical Exam:  weight is 196 lb 1.9 oz (89 kg). Her oral temperature is 98.3 F (36.8 C). Her blood pressure is 158/75 (abnormal) and her pulse is 77. Her respiration is 20 and oxygen saturation is 100%.   Wt Readings from Last 3 Encounters:  10/31/17 196 lb 1.9 oz (89 kg)  10/17/17 195 lb (88.5 kg)  09/18/17 192 lb 8 oz (87.3 kg)    Physical Exam  Constitutional: She is oriented to person, place, and time.  HENT:  Head: Normocephalic and atraumatic.  Mouth/Throat: Oropharynx is clear and moist.  Eyes: Pupils are equal, round, and reactive to light. EOM are normal.  Neck: Normal range of motion.  Cardiovascular: Normal rate, regular rhythm and normal heart sounds.  Pulmonary/Chest: Effort normal and breath sounds normal.  Abdominal: Soft. Bowel sounds are normal.  Musculoskeletal: Normal range of motion. She exhibits no edema, tenderness or deformity.  Lymphadenopathy:    She has no cervical adenopathy.  Neurological: She is alert and oriented to person, place, and time.  Skin: Skin is warm and dry. No rash noted. No erythema.  Psychiatric: She has a normal mood and affect.  Her behavior is normal. Judgment and thought content normal.  Vitals reviewed.    Lab Results  Component Value Date   WBC 4.2 10/31/2017   HGB 10.2 (L) 10/31/2017   HCT 31.7 (L) 10/31/2017   MCV 86.8 10/31/2017   PLT 294 10/31/2017   Lab Results  Component Value Date   FERRITIN 574 (H) 10/17/2017   IRON 34 (L) 10/17/2017   TIBC 381 10/17/2017   UIBC 347 10/17/2017   IRONPCTSAT 9 (L) 10/17/2017   Lab Results  Component Value Date   RETICCTPCT 2.9 (H) 10/17/2017   RBC 3.65 (L) 10/31/2017   Lab Results  Component Value Date   KPAFRELGTCHN 4.3 10/17/2017   LAMBDASER 44.4 (H) 10/17/2017   KAPLAMBRATIO 0.10 (L) 10/17/2017  Lab Results  Component Value Date   IGGSERUM 520 (L) 10/17/2017   IGA 40 (L) 10/17/2017   IGMSERUM 5 (L) 10/17/2017   Lab Results  Component Value Date   TOTALPROTELP 6.2 10/17/2017   ALBUMINELP 3.6 10/17/2017   A1GS 0.2 10/17/2017   A2GS 0.8 10/17/2017   BETS 1.0 10/17/2017   BETA2SER 0.4 11/23/2014   GAMS 0.5 10/17/2017   MSPIKE Not Observed 10/17/2017   SPEI Comment 09/18/2017     Chemistry      Component Value Date/Time   NA 137 10/31/2017 1125   NA 141 01/10/2017 1115   NA 140 06/21/2016 0918   K 3.4 10/31/2017 1125   K 4.0 01/10/2017 1115   K 4.3 06/21/2016 0918   CL 111 (H) 10/31/2017 1125   CL 106 01/10/2017 1115   CO2 26 10/31/2017 1125   CO2 27 01/10/2017 1115   CO2 20 (L) 06/21/2016 0918   BUN 12 10/31/2017 1125   BUN 15 01/10/2017 1115   BUN 15.8 06/21/2016 0918   CREATININE 0.80 10/31/2017 1125   CREATININE 1.0 01/10/2017 1115   CREATININE 0.8 06/21/2016 0918      Component Value Date/Time   CALCIUM 9.4 10/31/2017 1125   CALCIUM 9.5 01/10/2017 1115   CALCIUM 9.4 06/21/2016 0918   ALKPHOS 43 10/31/2017 1125   ALKPHOS 40 01/10/2017 1115   ALKPHOS 66 06/21/2016 0918   AST 19 10/31/2017 1125   AST 17 06/21/2016 0918   ALT 24 10/31/2017 1125   ALT 19 01/10/2017 1115   ALT 37 06/21/2016 0918   BILITOT 0.6  10/31/2017 1125   BILITOT 0.32 06/21/2016 0918      Impression and Plan: Emily Greer with recurrent lambda light chain myeloma and also localized recurrent endometrial cancer (followed by gyn onc Dr. Nelly Rout).   Her second stem cell transplantation was back in June 2016.  Since her lambda light chain was improved, we will continue her on the carfilzomib/pomalidomide.  Her levels are quite low.  She is tolerating treatment well.  Given that she has to be down to [place] for her mom, we can make adjustments to her protocol with respect to timing.  I want to make sure that we do not interfere with her being able to help her mom.  I would like to have her come back to see her in another month.  Again, we can always make adjustments to our dates as needed.    Josph Macho, MD 10/2/20191:20 PM

## 2017-10-31 NOTE — Progress Notes (Signed)
Patient just got Sancuso patch and applied late morning. Will give IV Zofran antiemetic today to adequately cover.

## 2017-10-31 NOTE — Patient Instructions (Signed)
Carfilzomib injection What is this medicine? CARFILZOMIB (kar FILZ oh mib) targets a specific protein within cancer cells and stops the cancer cells from growing. It is used to treat multiple myeloma. This medicine may be used for other purposes; ask your health care provider or pharmacist if you have questions. COMMON BRAND NAME(S): KYPROLIS What should I tell my health care provider before I take this medicine? They need to know if you have any of these conditions: -heart disease -history of blood clots -irregular heartbeat -kidney disease -liver disease -lung or breathing disease -an unusual or allergic reaction to carfilzomib, or other medicines, foods, dyes, or preservatives -pregnant or trying to get pregnant -breast-feeding How should I use this medicine? This medicine is for injection or infusion into a vein. It is given by a health care professional in a hospital or clinic setting. Talk to your pediatrician regarding the use of this medicine in children. Special care may be needed. Overdosage: If you think you have taken too much of this medicine contact a poison control center or emergency room at once. NOTE: This medicine is only for you. Do not share this medicine with others. What if I miss a dose? It is important not to miss your dose. Call your doctor or health care professional if you are unable to keep an appointment. What may interact with this medicine? Interactions are not expected. Give your health care provider a list of all the medicines, herbs, non-prescription drugs, or dietary supplements you use. Also tell them if you smoke, drink alcohol, or use illegal drugs. Some items may interact with your medicine. This list may not describe all possible interactions. Give your health care provider a list of all the medicines, herbs, non-prescription drugs, or dietary supplements you use. Also tell them if you smoke, drink alcohol, or use illegal drugs. Some items may  interact with your medicine. What should I watch for while using this medicine? Your condition will be monitored carefully while you are receiving this medicine. Report any side effects. Continue your course of treatment even though you feel ill unless your doctor tells you to stop. You may need blood work done while you are taking this medicine. Do not become pregnant while taking this medicine or for at least 30 days after stopping it. Women should inform their doctor if they wish to become pregnant or think they might be pregnant. There is a potential for serious side effects to an unborn child. Men should not father a child while taking this medicine and for 90 days after stopping it. Talk to your health care professional or pharmacist for more information. Do not breast-feed an infant while taking this medicine. Check with your doctor or health care professional if you get an attack of severe diarrhea, nausea and vomiting, or if you sweat a lot. The loss of too much body fluid can make it dangerous for you to take this medicine. You may get dizzy. Do not drive, use machinery, or do anything that needs mental alertness until you know how this medicine affects you. Do not stand or sit up quickly, especially if you are an older patient. This reduces the risk of dizzy or fainting spells. What side effects may I notice from receiving this medicine? Side effects that you should report to your doctor or health care professional as soon as possible: -allergic reactions like skin rash, itching or hives, swelling of the face, lips, or tongue -confusion -dizziness -feeling faint or lightheaded -fever or chills -  palpitations -seizures -signs and symptoms of bleeding such as bloody or black, tarry stools; red or dark-brown urine; spitting up blood or brown material that looks like coffee grounds; red spots on the skin; unusual bruising or bleeding including from the eye, gums, or nose -signs and symptoms of  a blood clot such as breathing problems; changes in vision; chest pain; severe, sudden headache; pain, swelling, warmth in the leg; trouble speaking; sudden numbness or weakness of the face, arm or leg -signs and symptoms of kidney injury like trouble passing urine or change in the amount of urine -signs and symptoms of liver injury like dark yellow or brown urine; general ill feeling or flu-like symptoms; light-colored stools; loss of appetite; nausea; right upper belly pain; unusually weak or tired; yellowing of the eyes or skin Side effects that usually do not require medical attention (report to your doctor or health care professional if they continue or are bothersome): -back pain -cough -diarrhea -headache -muscle cramps -vomiting This list may not describe all possible side effects. Call your doctor for medical advice about side effects. You may report side effects to FDA at 1-800-FDA-1088. Where should I keep my medicine? This drug is given in a hospital or clinic and will not be stored at home. NOTE: This sheet is a summary. It may not cover all possible information. If you have questions about this medicine, talk to your doctor, pharmacist, or health care provider.  2018 Elsevier/Gold Standard (2015-02-18 13:39:23) Ferumoxytol injection What is this medicine? FERUMOXYTOL is an iron complex. Iron is used to make healthy red blood cells, which carry oxygen and nutrients throughout the body. This medicine is used to treat iron deficiency anemia in people with chronic kidney disease. This medicine may be used for other purposes; ask your health care provider or pharmacist if you have questions. COMMON BRAND NAME(S): Feraheme What should I tell my health care provider before I take this medicine? They need to know if you have any of these conditions: -anemia not caused by low iron levels -high levels of iron in the blood -magnetic resonance imaging (MRI) test scheduled -an unusual or  allergic reaction to iron, other medicines, foods, dyes, or preservatives -pregnant or trying to get pregnant -breast-feeding How should I use this medicine? This medicine is for injection into a vein. It is given by a health care professional in a hospital or clinic setting. Talk to your pediatrician regarding the use of this medicine in children. Special care may be needed. Overdosage: If you think you have taken too much of this medicine contact a poison control center or emergency room at once. NOTE: This medicine is only for you. Do not share this medicine with others. What if I miss a dose? It is important not to miss your dose. Call your doctor or health care professional if you are unable to keep an appointment. What may interact with this medicine? This medicine may interact with the following medications: -other iron products This list may not describe all possible interactions. Give your health care provider a list of all the medicines, herbs, non-prescription drugs, or dietary supplements you use. Also tell them if you smoke, drink alcohol, or use illegal drugs. Some items may interact with your medicine. What should I watch for while using this medicine? Visit your doctor or healthcare professional regularly. Tell your doctor or healthcare professional if your symptoms do not start to get better or if they get worse. You may need blood work done while  you are taking this medicine. You may need to follow a special diet. Talk to your doctor. Foods that contain iron include: whole grains/cereals, dried fruits, beans, or peas, leafy green vegetables, and organ meats (liver, kidney). What side effects may I notice from receiving this medicine? Side effects that you should report to your doctor or health care professional as soon as possible: -allergic reactions like skin rash, itching or hives, swelling of the face, lips, or tongue -breathing problems -changes in blood pressure -feeling  faint or lightheaded, falls -fever or chills -flushing, sweating, or hot feelings -swelling of the ankles or feet Side effects that usually do not require medical attention (report to your doctor or health care professional if they continue or are bothersome): -diarrhea -headache -nausea, vomiting -stomach pain This list may not describe all possible side effects. Call your doctor for medical advice about side effects. You may report side effects to FDA at 1-800-FDA-1088. Where should I keep my medicine? This drug is given in a hospital or clinic and will not be stored at home. NOTE: This sheet is a summary. It may not cover all possible information. If you have questions about this medicine, talk to your doctor, pharmacist, or health care provider.  2018 Elsevier/Gold Standard (2015-02-18 12:41:49) Dexamethasone injection What is this medicine? DEXAMETHASONE (dex a METH a sone) is a corticosteroid. It is used to treat inflammation of the skin, joints, lungs, and other organs. Common conditions treated include asthma, allergies, and arthritis. It is also used for other conditions, like blood disorders and diseases of the adrenal glands. This medicine may be used for other purposes; ask your health care provider or pharmacist if you have questions. COMMON BRAND NAME(S): Decadron, DoubleDex, Simplist Dexamethasone, Solurex What should I tell my health care provider before I take this medicine? They need to know if you have any of these conditions: -blood clotting problems -Cushing's syndrome -diabetes -glaucoma -heart problems or disease -high blood pressure -infection like herpes, measles, tuberculosis, or chickenpox -kidney disease -liver disease -mental problems -myasthenia gravis -osteoporosis -previous heart attack -seizures -stomach, ulcer or intestine disease including colitis and diverticulitis -thyroid problem -an unusual or allergic reaction to dexamethasone,  corticosteroids, other medicines, lactose, foods, dyes, or preservatives -pregnant or trying to get pregnant -breast-feeding How should I use this medicine? This medicine is for injection into a muscle, joint, lesion, soft tissue, or vein. It is given by a health care professional in a hospital or clinic setting. Talk to your pediatrician regarding the use of this medicine in children. Special care may be needed. Overdosage: If you think you have taken too much of this medicine contact a poison control center or emergency room at once. NOTE: This medicine is only for you. Do not share this medicine with others. What if I miss a dose? This may not apply. If you are having a series of injections over a prolonged period, try not to miss an appointment. Call your doctor or health care professional to reschedule if you are unable to keep an appointment. What may interact with this medicine? Do not take this medicine with any of the following medications: -mifepristone, RU-486 -vaccines This medicine may also interact with the following medications: -amphotericin B -antibiotics like clarithromycin, erythromycin, and troleandomycin -aspirin and aspirin-like drugs -barbiturates like phenobarbital -carbamazepine -cholestyramine -cholinesterase inhibitors like donepezil, galantamine, rivastigmine, and tacrine -cyclosporine -digoxin -diuretics -ephedrine -female hormones, like estrogens or progestins and birth control pills -indinavir -isoniazid -ketoconazole -medicines for diabetes -medicines that improve muscle  tone or strength for conditions like myasthenia gravis -NSAIDs, medicines for pain and inflammation, like ibuprofen or naproxen -phenytoin -rifampin -thalidomide -warfarin This list may not describe all possible interactions. Give your health care provider a list of all the medicines, herbs, non-prescription drugs, or dietary supplements you use. Also tell them if you smoke, drink  alcohol, or use illegal drugs. Some items may interact with your medicine. What should I watch for while using this medicine? Your condition will be monitored carefully while you are receiving this medicine. If you are taking this medicine for a long time, carry an identification card with your name and address, the type and dose of your medicine, and your doctor's name and address. This medicine may increase your risk of getting an infection. Stay away from people who are sick. Tell your doctor or health care professional if you are around anyone with measles or chickenpox. Talk to your health care provider before you get any vaccines that you take this medicine. If you are going to have surgery, tell your doctor or health care professional that you have taken this medicine within the last twelve months. Ask your doctor or health care professional about your diet. You may need to lower the amount of salt you eat. The medicine can increase your blood sugar. If you are a diabetic check with your doctor if you need help adjusting the dose of your diabetic medicine. What side effects may I notice from receiving this medicine? Side effects that you should report to your doctor or health care professional as soon as possible: -allergic reactions like skin rash, itching or hives, swelling of the face, lips, or tongue -black or tarry stools -change in the amount of urine -changes in vision -confusion, excitement, restlessness, a false sense of well-being -fever, sore throat, sneezing, cough, or other signs of infection, wounds that will not heal -hallucinations -increased thirst -mental depression, mood swings, mistaken feelings of self importance or of being mistreated -pain in hips, back, ribs, arms, shoulders, or legs -pain, redness, or irritation at the injection site -redness, blistering, peeling or loosening of the skin, including inside the mouth -rounding out of face -swelling of feet or  lower legs -unusual bleeding or bruising -unusual tired or weak -wounds that do not heal Side effects that usually do not require medical attention (report to your doctor or health care professional if they continue or are bothersome): -diarrhea or constipation -change in taste -headache -nausea, vomiting -skin problems, acne, thin and shiny skin -touble sleeping -unusual growth of hair on the face or body -weight gain This list may not describe all possible side effects. Call your doctor for medical advice about side effects. You may report side effects to FDA at 1-800-FDA-1088. Where should I keep my medicine? This drug is given in a hospital or clinic and will not be stored at home. NOTE: This sheet is a summary. It may not cover all possible information. If you have questions about this medicine, talk to your doctor, pharmacist, or health care provider.  2018 Elsevier/Gold Standard (2007-05-09 14:04:12) Ondansetron injection What is this medicine? ONDANSETRON (on DAN se tron) is used to treat nausea and vomiting caused by chemotherapy. It is also used to prevent or treat nausea and vomiting after surgery. This medicine may be used for other purposes; ask your health care provider or pharmacist if you have questions. COMMON BRAND NAME(S): Zofran What should I tell my health care provider before I take this medicine? They need to  know if you have any of these conditions: -heart disease -history of irregular heartbeat -liver disease -low levels of magnesium or potassium in the blood -an unusual or allergic reaction to ondansetron, granisetron, other medicines, foods, dyes, or preservatives -pregnant or trying to get pregnant -breast-feeding How should I use this medicine? This medicine is for infusion into a vein. It is given by a health care professional in a hospital or clinic setting. Talk to your pediatrician regarding the use of this medicine in children. Special care may be  needed. Overdosage: If you think you have taken too much of this medicine contact a poison control center or emergency room at once. NOTE: This medicine is only for you. Do not share this medicine with others. What if I miss a dose? This does not apply. What may interact with this medicine? Do not take this medicine with any of the following medications: -apomorphine -certain medicines for fungal infections like fluconazole, itraconazole, ketoconazole, posaconazole, voriconazole -cisapride -dofetilide -dronedarone -pimozide -thioridazine -ziprasidone This medicine may also interact with the following medications: -carbamazepine -certain medicines for depression, anxiety, or psychotic disturbances -fentanyl -linezolid -MAOIs like Carbex, Eldepryl, Marplan, Nardil, and Parnate -methylene blue (injected into a vein) -other medicines that prolong the QT interval (cause an abnormal heart rhythm) -phenytoin -rifampicin -tramadol This list may not describe all possible interactions. Give your health care provider a list of all the medicines, herbs, non-prescription drugs, or dietary supplements you use. Also tell them if you smoke, drink alcohol, or use illegal drugs. Some items may interact with your medicine. What should I watch for while using this medicine? Your condition will be monitored carefully while you are receiving this medicine. What side effects may I notice from receiving this medicine? Side effects that you should report to your doctor or health care professional as soon as possible: -allergic reactions like skin rash, itching or hives, swelling of the face, lips, or tongue -breathing problems -confusion -dizziness -fast or irregular heartbeat -feeling faint or lightheaded, falls -fever and chills -loss of balance or coordination -seizures -sweating -swelling of the hands and feet -tightness in the chest -tremors -unusually weak or tired Side effects that usually  do not require medical attention (report to your doctor or health care professional if they continue or are bothersome): -constipation or diarrhea -headache This list may not describe all possible side effects. Call your doctor for medical advice about side effects. You may report side effects to FDA at 1-800-FDA-1088. Where should I keep my medicine? This drug is given in a hospital or clinic and will not be stored at home. NOTE: This sheet is a summary. It may not cover all possible information. If you have questions about this medicine, talk to your doctor, pharmacist, or health care provider.  2018 Elsevier/Gold Standard (2012-10-23 16:18:28)

## 2017-11-01 LAB — KAPPA/LAMBDA LIGHT CHAINS
Kappa free light chain: 4.4 mg/L (ref 3.3–19.4)
Kappa, lambda light chain ratio: 0.09 — ABNORMAL LOW (ref 0.26–1.65)
Lambda free light chains: 46.5 mg/L — ABNORMAL HIGH (ref 5.7–26.3)

## 2017-11-01 LAB — PROTEIN ELECTROPHORESIS, SERUM, WITH REFLEX
A/G Ratio: 1.8 — ABNORMAL HIGH (ref 0.7–1.7)
Albumin ELP: 3.9 g/dL (ref 2.9–4.4)
Alpha-1-Globulin: 0.2 g/dL (ref 0.0–0.4)
Alpha-2-Globulin: 0.7 g/dL (ref 0.4–1.0)
Beta Globulin: 1 g/dL (ref 0.7–1.3)
Gamma Globulin: 0.4 g/dL (ref 0.4–1.8)
Globulin, Total: 2.2 g/dL (ref 2.2–3.9)
Total Protein ELP: 6.1 g/dL (ref 6.0–8.5)

## 2017-11-01 LAB — IGG, IGA, IGM
IgA: 45 mg/dL — ABNORMAL LOW (ref 87–352)
IgG (Immunoglobin G), Serum: 546 mg/dL — ABNORMAL LOW (ref 700–1600)
IgM (Immunoglobulin M), Srm: <5 mg/dL — ABNORMAL LOW (ref 26–217)

## 2017-11-02 ENCOUNTER — Telehealth: Payer: Self-pay | Admitting: *Deleted

## 2017-11-02 DIAGNOSIS — C9000 Multiple myeloma not having achieved remission: Secondary | ICD-10-CM | POA: Diagnosis not present

## 2017-11-02 DIAGNOSIS — R5383 Other fatigue: Secondary | ICD-10-CM | POA: Diagnosis not present

## 2017-11-02 DIAGNOSIS — C541 Malignant neoplasm of endometrium: Secondary | ICD-10-CM | POA: Diagnosis not present

## 2017-11-02 DIAGNOSIS — Z79811 Long term (current) use of aromatase inhibitors: Secondary | ICD-10-CM | POA: Diagnosis not present

## 2017-11-02 DIAGNOSIS — Z5112 Encounter for antineoplastic immunotherapy: Secondary | ICD-10-CM | POA: Diagnosis not present

## 2017-11-02 DIAGNOSIS — D509 Iron deficiency anemia, unspecified: Secondary | ICD-10-CM | POA: Diagnosis not present

## 2017-11-02 NOTE — Telephone Encounter (Addendum)
Patient is aware of results  ----- Message from [name], MD sent at 11/01/2017  5:30 PM EDT ----- Call - the light chain myeloma level is stable!!  [name]

## 2017-11-03 ENCOUNTER — Encounter: Payer: Self-pay | Admitting: Internal Medicine

## 2017-11-03 ENCOUNTER — Ambulatory Visit (INDEPENDENT_AMBULATORY_CARE_PROVIDER_SITE_OTHER): Payer: Medicare Other | Admitting: Nurse Practitioner

## 2017-11-03 VITALS — BP 156/82 | HR 54 | Temp 98.3°F | Ht 64.0 in | Wt 198.0 lb

## 2017-11-03 DIAGNOSIS — I10 Essential (primary) hypertension: Secondary | ICD-10-CM

## 2017-11-03 DIAGNOSIS — R319 Hematuria, unspecified: Secondary | ICD-10-CM

## 2017-11-03 LAB — POCT URINALYSIS DIPSTICK
Bilirubin, UA: NEGATIVE
Glucose, UA: NEGATIVE
Ketones, UA: NEGATIVE
Leukocytes, UA: NEGATIVE
Nitrite, UA: NEGATIVE
Protein, UA: POSITIVE — AB
Spec Grav, UA: 1.025 (ref 1.010–1.025)
Urobilinogen, UA: 0.2 E.U./dL
pH, UA: 5.5 (ref 5.0–8.0)

## 2017-11-03 NOTE — Progress Notes (Signed)
Subjective:     Patient ID: Emily Greer , female    DOB: 1950/10/24 , 67 y.o.   MRN: [id number]   Hypertension  This is a chronic problem. The current episode started 1 to 4 weeks ago. The problem has been gradually worsening since onset. The problem is uncontrolled. Pertinent negatives include no chest pain, malaise/fatigue or peripheral edema. Risk factors for coronary artery disease include sedentary lifestyle (She does admit to eating out more and not sleeping well.). She is caring for her parents .     Past Medical History:  Diagnosis Date  . Avascular necrosis of femoral head (HCC)    bilateral per CT 07-26-2015  . Endometrial carcinoma [facility]) gyn oncologist-  dr Nelly Rout ([facility])/  radiation oncologist-- dr Roselind Messier   2013 dx  FIGO Stage 1A, Grade 2 endometrioid endometrial cancer s/p TAH w/ BSO and bilateral pelvic node dissection 10-31-2011 ;  recurrence at distal vagina 04/ 2014 s/p  brachytherapy (ended 07-29-2012);  2nd recurrence 12/ 2016  vaginal apex s/p  conformational radiotherapy 03-10-2015 to 04-20-2015  . GERD (gastroesophageal reflux disease)   . H/O stem cell transplant (HCC)    02/ 2000 and second one 06/ 2016  . 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 History of radiation therapy 6/4, 6/11, 6/19, 6/25, 07/29/2012   vagina 30.5 gray in 5 fx, HDR brachytherapy:   last radiation to vagina 03-10-2015 to 04-20-2015  50.4gray  . History of radiation therapy 03/10/15-04/20/15   vagina 50.4 in 28 fractions  . Hypertension   . Lambda light chain myeloma [facility]) oncologist-  dr Myna Hidalgo ([facility])  and  [facility] -- dr Silvestre Mesi gasparetto   dx 07/ 1999 s/p  VAD chemotherapy 11/ 1999,  purged autotransplant 03-21-1998 followed by auto stem cell transplant 03-29-1998;  recurrance w/ second autologous stem cell transplant 07-24-2014;  in Re-mission currently , chemo maintenance therapy  . Osteoporosis 12/18/05   Increased   risk   . PONV (postoperative nausea and vomiting)   . Wears glasses       Current Outpatient Medications:  .  acyclovir (ZOVIRAX) 400 MG tablet, Take 1 tablet (400 mg total) by mouth daily., Disp: 30 tablet, Rfl: 3 .  albuterol (PROVENTIL HFA;VENTOLIN HFA) 108 (90 BASE) MCG/ACT inhaler, Inhale 2 puffs into the lungs every 6 (six) hours as needed for wheezing or shortness of breath. 2 puffs 3 times daily x 5 days then every 6 hours as needed., Disp: 1 Inhaler, Rfl: 0 .  amLODipine (NORVASC) 10 MG tablet, TAKE 1 TABLET BY MOUTH DAILY--- takes in am, Disp: , Rfl:  .  aspirin EC 81 MG tablet, Take 81 mg by mouth 2 (two) times daily., Disp: , Rfl:  .  Chlorphen-PE-Acetaminophen (NOREL AD) 4-10-325 MG TABS, Take by mouth as needed. , Disp: , Rfl:  .  Cholecalciferol (VITAMIN D3) 2000 UNITS TABS, Take 2 tablets by mouth daily. , Disp: , Rfl:  .  furosemide (LASIX) 20 MG tablet, daily. , Disp: , Rfl:  .  granisetron (SANCUSO) 3.1 MG/24HR, Apply to skin starting 24 hours before chemotherapy. Remove after 7 days., Disp: 3 each, Rfl: 3 .  letrozole (FEMARA) 2.5 MG tablet, Take 1 tablet (2.5 mg total) by mouth daily., Disp: 60 tablet, Rfl: 4 .  lidocaine-prilocaine (EMLA) cream, Apply to affected area once, Disp: 30 g, Rfl: 3 .  loperamide (IMODIUM) 2 MG capsule, Take by mouth as needed for  diarrhea or loose stools. Reported on 08/19/2015, Disp: , Rfl:  .  loratadine (CLARITIN) 10 MG tablet, Take 10 mg by mouth every morning. Reported on 08/19/2015, Disp: , Rfl:  .  ondansetron (ZOFRAN) 8 MG tablet, Take 1 tablet (8 mg total) by mouth 2 (two) times daily as needed (Nausea or vomiting)., Disp: 30 tablet, Rfl: 1 .  pomalidomide (POMALYST) 1 MG capsule, Take with water on days 1-21. Repeat every 28 days. AUTH [id number], Disp: 21 capsule, Rfl: 0 .  potassium chloride SA (K-DUR,KLOR-CON) 20 MEQ tablet, TAKE 1 TABLET(20 MEQ) BY MOUTH TWICE DAILY, Disp: 60 tablet, Rfl: 0 .  prochlorperazine (COMPAZINE) 10 MG tablet,  Take 1 tablet (10 mg total) by mouth every 6 (six) hours as needed (Nausea or vomiting)., Disp: 30 tablet, Rfl: 1 .  triamcinolone (NASACORT ALLERGY 24HR) 55 MCG/ACT AERO nasal inhaler, Place 2 sprays into the nose daily., Disp: , Rfl:  .  triamterene-hydrochlorothiazide (MAXZIDE-25) 37.5-25 MG tablet, TK 1 T PO QD, Disp: , Rfl: 3   Review of Systems  Constitutional: Negative.  Negative for malaise/fatigue.  HENT: Negative.   Respiratory: Negative.   Cardiovascular: Negative.  Negative for chest pain.  Musculoskeletal: Negative.   Skin: Negative.      Today's Vitals   11/03/17 1149  BP: (!) 156/82  Pulse: (!) 54  Temp: 98.3 F (36.8 C)  TempSrc: Oral  Weight: 198 lb (89.8 kg)  Height: 5\' 4"  (1.626 m)   Body mass index is 33.99 kg/m.   Objective:  Physical Exam  Constitutional: She appears well-developed and well-nourished.  Eyes: Pupils are equal, round, and reactive to light.  Cardiovascular: Normal rate, regular rhythm and normal heart sounds.  Pulmonary/Chest: Effort normal and breath sounds normal.  Skin: Skin is warm and dry. Capillary refill takes less than 2 seconds.  Psychiatric: She has a normal mood and affect.       Assessment And Plan:     1. Essential hypertension  Chronic, gradually increasing, slightly elevated today.   At this time will not make any changes to her medications  She will check her blood pressure once a day until Wednesday and send the results via mychart or call to office.   Advised to limit intake of high salt - 2 g sodium diet.   Encouraged to get more sleep.    2. Hematuria, unspecified type  Moderate blood in Urine dipstick  Small leukocytes  Will send for urine culture.      Arnette Felts, FNP

## 2017-11-04 LAB — URINE CULTURE

## 2017-11-05 ENCOUNTER — Telehealth: Payer: Self-pay

## 2017-11-05 LAB — UPEP/UIFE/LIGHT CHAINS/TP, 24-HR UR
% BETA, Urine: 16 %
ALPHA 1 URINE: 4.4 %
Albumin, U: 38.4 %
Alpha 2, Urine: 11.3 %
Free Kappa Lt Chains,Ur: 19.4 mg/L (ref 1.35–24.19)
Free Kappa/Lambda Ratio: 0.13 — ABNORMAL LOW (ref 2.04–10.37)
Free Lambda Lt Chains,Ur: 144 mg/L — ABNORMAL HIGH (ref 0.24–6.66)
GAMMA GLOBULIN URINE: 29.9 %
M-SPIKE %, Urine: 18.8 % — ABNORMAL HIGH
M-Spike, Mg/24 Hr: 23 mg/24 hr — ABNORMAL HIGH
Total Protein, Urine-Ur/day: 124 mg/24 hr (ref 30–150)
Total Protein, Urine: 16.5 mg/dL
Total Volume: 750

## 2017-11-05 NOTE — Telephone Encounter (Signed)
Pt called stating you asked her to call in her blood pressure reading she stated this morning it was 144/77 and last night it was 153/83. YRL,RMA

## 2017-11-05 NOTE — Progress Notes (Signed)
Treated by Velora Mediate

## 2017-11-06 ENCOUNTER — Other Ambulatory Visit: Payer: Self-pay | Admitting: *Deleted

## 2017-11-06 DIAGNOSIS — C9000 Multiple myeloma not having achieved remission: Secondary | ICD-10-CM

## 2017-11-06 MED ORDER — POMALIDOMIDE 1 MG PO CAPS: ORAL_CAPSULE | ORAL | 0 refills | Status: DC

## 2017-11-08 NOTE — Telephone Encounter (Signed)
This is slightly better, has she continued to check her blood pressure if so get the readings.

## 2017-11-09 NOTE — Telephone Encounter (Signed)
Left pt v/m to call office. YRL,RMA

## 2017-11-20 NOTE — Telephone Encounter (Signed)
Pt states she has been checking her bp in the mornings.This morning it was 130/72. Pt states she has been checking it three times and then she takes the middle one. YRL,RMA   11/20/17 130/72 morning 11/19/17 122/69 afternoon 11/18/17 138/75 afternoon 11/17/17 118/74 afternoon 11/15/17 133/69 morning

## 2017-11-21 NOTE — Telephone Encounter (Signed)
Blood pressure is stable, continue with current medications and her follow up.

## 2017-11-26 NOTE — Telephone Encounter (Signed)
Patient notified YRL,RMA 

## 2017-11-28 ENCOUNTER — Other Ambulatory Visit: Payer: Self-pay

## 2017-11-28 ENCOUNTER — Inpatient Hospital Stay: Payer: Medicare Other

## 2017-11-28 ENCOUNTER — Inpatient Hospital Stay (HOSPITAL_BASED_OUTPATIENT_CLINIC_OR_DEPARTMENT_OTHER): Payer: Medicare Other | Admitting: Family

## 2017-11-28 ENCOUNTER — Encounter: Payer: Self-pay | Admitting: Family

## 2017-11-28 VITALS — BP 139/60 | HR 82 | Temp 98.6°F | Resp 18 | Wt 200.0 lb

## 2017-11-28 DIAGNOSIS — C9000 Multiple myeloma not having achieved remission: Secondary | ICD-10-CM | POA: Diagnosis not present

## 2017-11-28 DIAGNOSIS — C9001 Multiple myeloma in remission: Secondary | ICD-10-CM

## 2017-11-28 DIAGNOSIS — C541 Malignant neoplasm of endometrium: Secondary | ICD-10-CM | POA: Diagnosis not present

## 2017-11-28 DIAGNOSIS — D500 Iron deficiency anemia secondary to blood loss (chronic): Secondary | ICD-10-CM

## 2017-11-28 DIAGNOSIS — R5383 Other fatigue: Secondary | ICD-10-CM | POA: Diagnosis not present

## 2017-11-28 DIAGNOSIS — Z7982 Long term (current) use of aspirin: Secondary | ICD-10-CM

## 2017-11-28 DIAGNOSIS — Z79811 Long term (current) use of aromatase inhibitors: Secondary | ICD-10-CM | POA: Diagnosis not present

## 2017-11-28 DIAGNOSIS — Z79899 Other long term (current) drug therapy: Secondary | ICD-10-CM

## 2017-11-28 DIAGNOSIS — D509 Iron deficiency anemia, unspecified: Secondary | ICD-10-CM | POA: Diagnosis not present

## 2017-11-28 DIAGNOSIS — Z5112 Encounter for antineoplastic immunotherapy: Secondary | ICD-10-CM | POA: Diagnosis not present

## 2017-11-28 DIAGNOSIS — Z923 Personal history of irradiation: Secondary | ICD-10-CM | POA: Diagnosis not present

## 2017-11-28 LAB — CBC WITH DIFFERENTIAL (CANCER CENTER ONLY)
Abs Immature Granulocytes: 0.01 10*3/uL (ref 0.00–0.07)
Basophils Absolute: 0.1 10*3/uL (ref 0.0–0.1)
Basophils Relative: 3 %
Eosinophils Absolute: 0.2 10*3/uL (ref 0.0–0.5)
Eosinophils Relative: 5 %
HCT: 32.1 % — ABNORMAL LOW (ref 36.0–46.0)
Hemoglobin: 10.2 g/dL — ABNORMAL LOW (ref 12.0–15.0)
Immature Granulocytes: 0 %
Lymphocytes Relative: 18 %
Lymphs Abs: 0.6 10*3/uL — ABNORMAL LOW (ref 0.7–4.0)
MCH: 27.4 pg (ref 26.0–34.0)
MCHC: 31.8 g/dL (ref 30.0–36.0)
MCV: 86.3 fL (ref 80.0–100.0)
Monocytes Absolute: 0.6 10*3/uL (ref 0.1–1.0)
Monocytes Relative: 18 %
Neutro Abs: 1.9 10*3/uL (ref 1.7–7.7)
Neutrophils Relative %: 56 %
Platelet Count: 228 10*3/uL (ref 150–400)
RBC: 3.72 MIL/uL — ABNORMAL LOW (ref 3.87–5.11)
RDW: 17 % — ABNORMAL HIGH (ref 11.5–15.5)
WBC Count: 3.5 10*3/uL — ABNORMAL LOW (ref 4.0–10.5)
nRBC: 0 % (ref 0.0–0.2)

## 2017-11-28 LAB — CMP (CANCER CENTER ONLY)
ALT: 32 U/L (ref 10–47)
AST: 20 U/L (ref 11–38)
Albumin: 3.5 g/dL (ref 3.5–5.0)
Alkaline Phosphatase: 34 U/L (ref 26–84)
Anion gap: 0 — ABNORMAL LOW (ref 5–15)
BUN: 14 mg/dL (ref 7–22)
CO2: 28 mmol/L (ref 18–33)
Calcium: 9.3 mg/dL (ref 8.0–10.3)
Chloride: 113 mmol/L — ABNORMAL HIGH (ref 98–108)
Creatinine: 1 mg/dL (ref 0.60–1.20)
Glucose, Bld: 96 mg/dL (ref 73–118)
Potassium: 3.4 mmol/L (ref 3.3–4.7)
Sodium: 139 mmol/L (ref 128–145)
Total Bilirubin: 0.5 mg/dL (ref 0.2–1.6)
Total Protein: 6.1 g/dL — ABNORMAL LOW (ref 6.4–8.1)

## 2017-11-28 LAB — LACTATE DEHYDROGENASE: LDH: 169 U/L (ref 98–192)

## 2017-11-28 MED ORDER — DEXTROSE 5 % IV SOLN
55.0000 mg/m2 | Freq: Once | INTRAVENOUS | Status: AC
  Administered 2017-11-28: 110 mg via INTRAVENOUS
  Filled 2017-11-28: qty 30

## 2017-11-28 MED ORDER — SODIUM CHLORIDE 0.9 % IV SOLN
Freq: Once | INTRAVENOUS | Status: AC
  Administered 2017-11-28: 12:00:00 via INTRAVENOUS
  Filled 2017-11-28: qty 250

## 2017-11-28 MED ORDER — HEPARIN SOD (PORK) LOCK FLUSH 100 UNIT/ML IV SOLN
500.0000 [IU] | Freq: Once | INTRAVENOUS | Status: AC | PRN
  Administered 2017-11-28: 500 [IU]
  Filled 2017-11-28: qty 5

## 2017-11-28 MED ORDER — DEXAMETHASONE SODIUM PHOSPHATE 10 MG/ML IJ SOLN
10.0000 mg | Freq: Once | INTRAMUSCULAR | Status: AC
  Administered 2017-11-28: 10 mg via INTRAVENOUS

## 2017-11-28 MED ORDER — DEXAMETHASONE SODIUM PHOSPHATE 10 MG/ML IJ SOLN
INTRAMUSCULAR | Status: AC
  Filled 2017-11-28: qty 1

## 2017-11-28 MED ORDER — SODIUM CHLORIDE 0.9% FLUSH
10.0000 mL | INTRAVENOUS | Status: DC | PRN
  Administered 2017-11-28: 10 mL
  Filled 2017-11-28: qty 10

## 2017-11-28 MED ORDER — SODIUM CHLORIDE 0.9 % IV SOLN
Freq: Once | INTRAVENOUS | Status: DC
  Filled 2017-11-28: qty 250

## 2017-11-28 NOTE — Patient Instructions (Signed)
Carfilzomib injection What is this medicine? CARFILZOMIB (kar FILZ oh mib) targets a specific protein within cancer cells and stops the cancer cells from growing. It is used to treat multiple myeloma. This medicine may be used for other purposes; ask your health care provider or pharmacist if you have questions. COMMON BRAND NAME(S): KYPROLIS What should I tell my health care provider before I take this medicine? They need to know if you have any of these conditions: -heart disease -history of blood clots -irregular heartbeat -kidney disease -liver disease -lung or breathing disease -an unusual or allergic reaction to carfilzomib, or other medicines, foods, dyes, or preservatives -pregnant or trying to get pregnant -breast-feeding How should I use this medicine? This medicine is for injection or infusion into a vein. It is given by a health care professional in a hospital or clinic setting. Talk to your pediatrician regarding the use of this medicine in children. Special care may be needed. Overdosage: If you think you have taken too much of this medicine contact a poison control center or emergency room at once. NOTE: This medicine is only for you. Do not share this medicine with others. What if I miss a dose? It is important not to miss your dose. Call your doctor or health care professional if you are unable to keep an appointment. What may interact with this medicine? Interactions are not expected. Give your health care provider a list of all the medicines, herbs, non-prescription drugs, or dietary supplements you use. Also tell them if you smoke, drink alcohol, or use illegal drugs. Some items may interact with your medicine. This list may not describe all possible interactions. Give your health care provider a list of all the medicines, herbs, non-prescription drugs, or dietary supplements you use. Also tell them if you smoke, drink alcohol, or use illegal drugs. Some items may  interact with your medicine. What should I watch for while using this medicine? Your condition will be monitored carefully while you are receiving this medicine. Report any side effects. Continue your course of treatment even though you feel ill unless your doctor tells you to stop. You may need blood work done while you are taking this medicine. Do not become pregnant while taking this medicine or for at least 30 days after stopping it. Women should inform their doctor if they wish to become pregnant or think they might be pregnant. There is a potential for serious side effects to an unborn child. Men should not father a child while taking this medicine and for 90 days after stopping it. Talk to your health care professional or pharmacist for more information. Do not breast-feed an infant while taking this medicine. Check with your doctor or health care professional if you get an attack of severe diarrhea, nausea and vomiting, or if you sweat a lot. The loss of too much body fluid can make it dangerous for you to take this medicine. You may get dizzy. Do not drive, use machinery, or do anything that needs mental alertness until you know how this medicine affects you. Do not stand or sit up quickly, especially if you are an older patient. This reduces the risk of dizzy or fainting spells. What side effects may I notice from receiving this medicine? Side effects that you should report to your doctor or health care professional as soon as possible: -allergic reactions like skin rash, itching or hives, swelling of the face, lips, or tongue -confusion -dizziness -feeling faint or lightheaded -fever or chills -  palpitations -seizures -signs and symptoms of bleeding such as bloody or black, tarry stools; red or dark-brown urine; spitting up blood or brown material that looks like coffee grounds; red spots on the skin; unusual bruising or bleeding including from the eye, gums, or nose -signs and symptoms of  a blood clot such as breathing problems; changes in vision; chest pain; severe, sudden headache; pain, swelling, warmth in the leg; trouble speaking; sudden numbness or weakness of the face, arm or leg -signs and symptoms of kidney injury like trouble passing urine or change in the amount of urine -signs and symptoms of liver injury like dark yellow or brown urine; general ill feeling or flu-like symptoms; light-colored stools; loss of appetite; nausea; right upper belly pain; unusually weak or tired; yellowing of the eyes or skin Side effects that usually do not require medical attention (report to your doctor or health care professional if they continue or are bothersome): -back pain -cough -diarrhea -headache -muscle cramps -vomiting This list may not describe all possible side effects. Call your doctor for medical advice about side effects. You may report side effects to FDA at 1-800-FDA-1088. Where should I keep my medicine? This drug is given in a hospital or clinic and will not be stored at home. NOTE: This sheet is a summary. It may not cover all possible information. If you have questions about this medicine, talk to your doctor, pharmacist, or health care provider.  2018 Elsevier/Gold Standard (2015-02-18 13:39:23)  

## 2017-11-28 NOTE — Progress Notes (Signed)
Hematology and Oncology Follow Up Visit  TALON SEMELSBERGER [id number] [birth year] 67 y.o. 11/28/2017   Principle Diagnosis:  Recurrent lambda light chain myeloma History of recurrent endometrial carcinoma Iron deficiency anemia -blood loss  PastTherapy: Status post second autologous stem cell transplant on 07/24/2014 Maintenance therapy with Pomalidomide/every 2 week Velcade - d/c'ed Xgeva 120 mg subcutaneous every 3months -next dose inNovember 2019 Radiation therapy for endometrial recurrence - completed 04/20/2015  Current Therapy:   Pomalyst/Kyprolis 70mg /m2 IV q 2 weeks - s/p cycle11 Femara 2.5 mg po q day  IV iron as indicated   Interim History:  Ms. Manzueta is here today for follow-up and treatment. She is doing well but has some fatigue. She attribute this to having company at her home for the last few days and travelling often to [facility] to help with her parents.  Lambda light chains were 4.65 mg/dL earlier this month.  She had symptoms of UTI on Monday but this resolved with hydrating well and taking cranberry capsules. She states that her PCP has mentioned referring her to urology is she continues to have the UTI's.  No fever, chills, n/v, cough, rash, dizziness, SOB, chest pain, palpitations, abdominal pain or changes in bowel or bladder habits.  No tenderness, numbness or tingling in her extremities. The puffiness in her feet comes and goes. This improves when she props up her feet. She is also taking her lasix daily as prescribed.  No lymphadenopathy noted on exam.  No episodes of bleeding, no bruising or petechiae.  She has a good appetite and is staying well hydrated. Her weight is stable.   ECOG Performance Status: 1 - Symptomatic but completely ambulatory  Medications:  Allergies as of 11/28/2017      Reactions   Codeine Nausea Only      Medication List        Accurate as of 11/28/17 11:20 AM. Always use your most recent med list.          acyclovir 400 MG tablet Commonly known as:  ZOVIRAX Take 1 tablet (400 mg total) by mouth daily.   albuterol 108 (90 Base) MCG/ACT inhaler Commonly known as:  PROVENTIL HFA;VENTOLIN HFA Inhale 2 puffs into the lungs every 6 (six) hours as needed for wheezing or shortness of breath. 2 puffs 3 times daily x 5 days then every 6 hours as needed.   amLODipine 10 MG tablet Commonly known as:  NORVASC TAKE 1 TABLET BY MOUTH DAILY--- takes in am   aspirin EC 81 MG tablet Take 81 mg by mouth 2 (two) times daily.   granisetron 3.1 MG/24HR Commonly known as:  SANCUSO Apply to skin starting 24 hours before chemotherapy. Remove after 7 days.   letrozole 2.5 MG tablet Commonly known as:  FEMARA Take 1 tablet (2.5 mg total) by mouth daily.   lidocaine-prilocaine cream Commonly known as:  EMLA Apply to affected area once   loperamide 2 MG capsule Commonly known as:  IMODIUM Take by mouth as needed for diarrhea or loose stools. Reported on 08/19/2015   loratadine 10 MG tablet Commonly known as:  CLARITIN Take 10 mg by mouth every morning. Reported on 08/19/2015   NASACORT ALLERGY 24HR 55 MCG/ACT Aero nasal inhaler Generic drug:  triamcinolone Place 2 sprays into the nose daily.   NOREL AD 4-10-325 MG Tabs Generic drug:  Chlorphen-PE-Acetaminophen Take by mouth as needed.   ondansetron 8 MG tablet Commonly known as:  ZOFRAN Take 1 tablet (8 mg total) by mouth 2 (two)  times daily as needed (Nausea or vomiting).   pomalidomide 1 MG capsule Commonly known as:  POMALYST Take with water on days 1-21. Repeat every 28 days. AUTH [id number]   potassium chloride SA 20 MEQ tablet Commonly known as:  K-DUR,KLOR-CON TAKE 1 TABLET(20 MEQ) BY MOUTH TWICE DAILY   prochlorperazine 10 MG tablet Commonly known as:  COMPAZINE Take 1 tablet (10 mg total) by mouth every 6 (six) hours as needed (Nausea or vomiting).   triamterene-hydrochlorothiazide 37.5-25 MG tablet Commonly known as:   MAXZIDE-25 TK 1 T PO QD   Vitamin D3 2000 units Tabs Take 2 tablets by mouth daily.       Allergies:  Allergies  Allergen Reactions  . Codeine Nausea Only    Past Medical History, Surgical history, Social history, and Family History were reviewed and updated.  Review of Systems: All other 10 point review of systems is negative.   Physical Exam:  weight is 200 lb (90.7 kg). Her oral temperature is 98.6 F (37 C). Her blood pressure is 139/60 and her pulse is 82. Her respiration is 18 and oxygen saturation is 100%.   Wt Readings from Last 3 Encounters:  11/28/17 200 lb (90.7 kg)  11/03/17 198 lb (89.8 kg)  10/31/17 196 lb 1.9 oz (89 kg)    Ocular: Sclerae unicteric, pupils equal, round and reactive to light Ear-nose-throat: Oropharynx clear, dentition fair Lymphatic: No cervical, supraclavicular or axillary adenopathy Lungs no rales or rhonchi, good excursion bilaterally Heart regular rate and rhythm, no murmur appreciated Abd soft, nontender, positive bowel sounds, no liver or spleen tip palpated on exam, no fluid wave  MSK no focal spinal tenderness, no joint edema Neuro: non-focal, well-oriented, appropriate affect Breasts: Deferred   Lab Results  Component Value Date   WBC 3.5 (L) 11/28/2017   HGB 10.2 (L) 11/28/2017   HCT 32.1 (L) 11/28/2017   MCV 86.3 11/28/2017   PLT 228 11/28/2017   Lab Results  Component Value Date   FERRITIN 574 (H) 10/17/2017   IRON 34 (L) 10/17/2017   TIBC 381 10/17/2017   UIBC 347 10/17/2017   IRONPCTSAT 9 (L) 10/17/2017   Lab Results  Component Value Date   RETICCTPCT 2.9 (H) 10/17/2017   RBC 3.72 (L) 11/28/2017   Lab Results  Component Value Date   KPAFRELGTCHN 4.4 10/31/2017   LAMBDASER 46.5 (H) 10/31/2017   KAPLAMBRATIO 0.13 (L) 11/02/2017   Lab Results  Component Value Date   IGGSERUM 546 (L) 10/31/2017   IGA 45 (L) 10/31/2017   IGMSERUM <5 (L) 10/31/2017   Lab Results  Component Value Date   TOTALPROTELP 6.1  10/31/2017   ALBUMINELP 3.9 10/31/2017   A1GS 0.2 10/31/2017   A2GS 0.7 10/31/2017   BETS 1.0 10/31/2017   BETA2SER 0.4 11/23/2014   GAMS 0.4 10/31/2017   MSPIKE Not Observed 10/31/2017   SPEI Comment 09/18/2017     Chemistry      Component Value Date/Time   NA 137 10/31/2017 1125   NA 141 01/10/2017 1115   NA 140 06/21/2016 0918   K 3.4 10/31/2017 1125   K 4.0 01/10/2017 1115   K 4.3 06/21/2016 0918   CL 111 (H) 10/31/2017 1125   CL 106 01/10/2017 1115   CO2 26 10/31/2017 1125   CO2 27 01/10/2017 1115   CO2 20 (L) 06/21/2016 0918   BUN 12 10/31/2017 1125   BUN 15 01/10/2017 1115   BUN 15.8 06/21/2016 0918   CREATININE 0.80 10/31/2017 1125  CREATININE 1.0 01/10/2017 1115   CREATININE 0.8 06/21/2016 0918      Component Value Date/Time   CALCIUM 9.4 10/31/2017 1125   CALCIUM 9.5 01/10/2017 1115   CALCIUM 9.4 06/21/2016 0918   ALKPHOS 43 10/31/2017 1125   ALKPHOS 40 01/10/2017 1115   ALKPHOS 66 06/21/2016 0918   AST 19 10/31/2017 1125   AST 17 06/21/2016 0918   ALT 24 10/31/2017 1125   ALT 19 01/10/2017 1115   ALT 37 06/21/2016 0918   BILITOT 0.6 10/31/2017 1125   BILITOT 0.32 06/21/2016 0918       Impression and Plan: Ms. Lebrun is a very pleasant 67 yo African American female with recurrent lambda light chain myeloma and also localized recurrent endometrial cancer (followed by gyn onc Dr. Nelly Rout). Her second stem cell transplant was in June 2016.  Myeloma study results for today are pending.  We will proceed with treatment today as planned.  We will see her again in 1 month.  She will contact our office with any questions or concerns. We can certainly see her sooner if need be.  Emeline Gins, NP 10/30/201911:20 AM

## 2017-11-29 LAB — IRON AND TIBC
Iron: 107 ug/dL (ref 41–142)
Saturation Ratios: 31 % (ref 21–57)
TIBC: 344 ug/dL (ref 236–444)
UIBC: 237 ug/dL (ref 120–384)

## 2017-11-29 LAB — IGG, IGA, IGM
IgA: 49 mg/dL — ABNORMAL LOW (ref 87–352)
IgG (Immunoglobin G), Serum: 476 mg/dL — ABNORMAL LOW (ref 700–1600)
IgM (Immunoglobulin M), Srm: 12 mg/dL — ABNORMAL LOW (ref 26–217)

## 2017-11-29 LAB — KAPPA/LAMBDA LIGHT CHAINS
Kappa free light chain: 6.9 mg/L (ref 3.3–19.4)
Kappa, lambda light chain ratio: 0.11 — ABNORMAL LOW (ref 0.26–1.65)
Lambda free light chains: 63.9 mg/L — ABNORMAL HIGH (ref 5.7–26.3)

## 2017-11-29 LAB — FERRITIN: Ferritin: 804 ng/mL — ABNORMAL HIGH (ref 11–307)

## 2017-11-30 LAB — PROTEIN ELECTROPHORESIS, SERUM, WITH REFLEX
A/G Ratio: 1.9 — ABNORMAL HIGH (ref 0.7–1.7)
Albumin ELP: 3.8 g/dL (ref 2.9–4.4)
Alpha-1-Globulin: 0.1 g/dL (ref 0.0–0.4)
Alpha-2-Globulin: 0.7 g/dL (ref 0.4–1.0)
Beta Globulin: 0.9 g/dL (ref 0.7–1.3)
Gamma Globulin: 0.3 g/dL — ABNORMAL LOW (ref 0.4–1.8)
Globulin, Total: 2 g/dL — ABNORMAL LOW (ref 2.2–3.9)
SPEP Interpretation: 0
Total Protein ELP: 5.8 g/dL — ABNORMAL LOW (ref 6.0–8.5)

## 2017-11-30 LAB — IMMUNOFIXATION REFLEX, SERUM
IgA: 51 mg/dL — ABNORMAL LOW (ref 87–352)
IgG (Immunoglobin G), Serum: 502 mg/dL — ABNORMAL LOW (ref 700–1600)
IgM (Immunoglobulin M), Srm: 13 mg/dL — ABNORMAL LOW (ref 26–217)

## 2017-12-03 ENCOUNTER — Encounter: Payer: Self-pay | Admitting: Internal Medicine

## 2017-12-03 ENCOUNTER — Ambulatory Visit (INDEPENDENT_AMBULATORY_CARE_PROVIDER_SITE_OTHER): Payer: Medicare Other | Admitting: Internal Medicine

## 2017-12-03 ENCOUNTER — Ambulatory Visit: Payer: Medicare Other | Admitting: Nurse Practitioner

## 2017-12-03 VITALS — BP 138/78 | HR 78 | Temp 98.0°F | Ht 64.0 in | Wt 198.4 lb

## 2017-12-03 DIAGNOSIS — R300 Dysuria: Secondary | ICD-10-CM | POA: Diagnosis not present

## 2017-12-03 DIAGNOSIS — I10 Essential (primary) hypertension: Secondary | ICD-10-CM | POA: Diagnosis not present

## 2017-12-03 DIAGNOSIS — R310 Gross hematuria: Secondary | ICD-10-CM

## 2017-12-03 LAB — POCT URINALYSIS DIPSTICK
Bilirubin, UA: NEGATIVE
Glucose, UA: NEGATIVE
Ketones, UA: NEGATIVE
Nitrite, UA: NEGATIVE
Protein, UA: NEGATIVE
Spec Grav, UA: 1.02 (ref 1.010–1.025)
Urobilinogen, UA: 0.2 E.U./dL
pH, UA: 7 (ref 5.0–8.0)

## 2017-12-03 NOTE — Progress Notes (Signed)
Subjective:     Patient ID: Emily Greer , female    DOB: 01/15/1951 , 67 y.o.   MRN: [id number]   Chief Complaint  Patient presents with  . Urinary Tract Infection    C/O urinary burning for the past 4 - 5 days     HPI  Urinary Tract Infection   This is a recurrent problem. The current episode started in the past 7 days. The problem occurs intermittently. The problem has been waxing and waning. The quality of the pain is described as burning. The pain is at a severity of 3/10. There has been no fever. Associated symptoms include hematuria. The treatment provided moderate relief.   She is concerned about having repeat episodes of hematuria.   Past Medical History:  Diagnosis Date  . Avascular necrosis of femoral head (HCC)    bilateral per CT 07-26-2015  . Endometrial carcinoma [facility]) gyn oncologist-  dr Nelly Rout ([facility])/  radiation oncologist-- dr Roselind Messier   2013 dx  FIGO Stage 1A, Grade 2 endometrioid endometrial cancer s/p TAH w/ BSO and bilateral pelvic node dissection 10-31-2011 ;  recurrence at distal vagina 04/ 2014 s/p  brachytherapy (ended 07-29-2012);  2nd recurrence 12/ 2016  vaginal apex s/p  conformational radiotherapy 03-10-2015 to 04-20-2015  . GERD (gastroesophageal reflux disease)   . H/O stem cell transplant (HCC)    02/ 2000 and second one 06/ 2016  . 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 History of radiation therapy 6/4, 6/11, 6/19, 6/25, 07/29/2012   vagina 30.5 gray in 5 fx, HDR brachytherapy:   last radiation to vagina 03-10-2015 to 04-20-2015  50.4gray  . History of radiation therapy 03/10/15-04/20/15   vagina 50.4 in 28 fractions  . Hypertension   . Lambda light chain myeloma [facility]) oncologist-  dr Myna Hidalgo ([facility])  and  [facility] -- dr Silvestre Mesi gasparetto   dx 07/ 1999 s/p  VAD chemotherapy 11/ 1999,  purged autotransplant 03-21-1998 followed by auto stem cell transplant 03-29-1998;  recurrance  w/ second autologous stem cell transplant 07-24-2014;  in Re-mission currently , chemo maintenance therapy  . Osteoporosis 12/18/05   Increased  risk   . PONV (postoperative nausea and vomiting)   . Wears glasses      Family History  Problem Relation Age of Onset  . Colon cancer Mother   . Hypertension Father   . Heart Problems Father      Current Outpatient Medications:  .  acyclovir (ZOVIRAX) 400 MG tablet, Take 1 tablet (400 mg total) by mouth daily., Disp: 30 tablet, Rfl: 3 .  albuterol (PROVENTIL HFA;VENTOLIN HFA) 108 (90 BASE) MCG/ACT inhaler, Inhale 2 puffs into the lungs every 6 (six) hours as needed for wheezing or shortness of breath. 2 puffs 3 times daily x 5 days then every 6 hours as needed., Disp: 1 Inhaler, Rfl: 0 .  amLODipine (NORVASC) 10 MG tablet, TAKE 1 TABLET BY MOUTH DAILY--- takes in am, Disp: , Rfl:  .  aspirin EC 81 MG tablet, Take 81 mg by mouth 2 (two) times daily., Disp: , Rfl:  .  Chlorphen-PE-Acetaminophen (NOREL AD) 4-10-325 MG TABS, Take by mouth as needed. , Disp: , Rfl:  .  Cholecalciferol (VITAMIN D3) 2000 UNITS TABS, Take 2 tablets by mouth daily. , Disp: , Rfl:  .  granisetron (SANCUSO) 3.1 MG/24HR, Apply to skin starting 24 hours before chemotherapy. Remove after 7 days., Disp: 3 each, Rfl:  3 .  letrozole (FEMARA) 2.5 MG tablet, Take 1 tablet (2.5 mg total) by mouth daily., Disp: 60 tablet, Rfl: 4 .  lidocaine-prilocaine (EMLA) cream, Apply to affected area once, Disp: 30 g, Rfl: 3 .  loperamide (IMODIUM) 2 MG capsule, Take by mouth as needed for diarrhea or loose stools. Reported on 08/19/2015, Disp: , Rfl:  .  loratadine (CLARITIN) 10 MG tablet, Take 10 mg by mouth every morning. Reported on 08/19/2015, Disp: , Rfl:  .  ondansetron (ZOFRAN) 8 MG tablet, Take 1 tablet (8 mg total) by mouth 2 (two) times daily as needed (Nausea or vomiting)., Disp: 30 tablet, Rfl: 1 .  pomalidomide (POMALYST) 1 MG capsule, Take with water on days 1-21. Repeat every 28  days. AUTH [id number], Disp: 21 capsule, Rfl: 0 .  potassium chloride SA (K-DUR,KLOR-CON) 20 MEQ tablet, TAKE 1 TABLET(20 MEQ) BY MOUTH TWICE DAILY, Disp: 60 tablet, Rfl: 0 .  prochlorperazine (COMPAZINE) 10 MG tablet, Take 1 tablet (10 mg total) by mouth every 6 (six) hours as needed (Nausea or vomiting)., Disp: 30 tablet, Rfl: 1 .  triamcinolone (NASACORT ALLERGY 24HR) 55 MCG/ACT AERO nasal inhaler, Place 2 sprays into the nose daily., Disp: , Rfl:  .  triamterene-hydrochlorothiazide (MAXZIDE-25) 37.5-25 MG tablet, TK 1 T PO QD, Disp: , Rfl: 3   Allergies  Allergen Reactions  . Codeine Nausea Only     Review of Systems  Constitutional: Negative.   Respiratory: Negative.   Cardiovascular: Negative.   Gastrointestinal: Negative.   Genitourinary: Positive for hematuria.  Neurological: Negative.   Psychiatric/Behavioral: Negative.      Today's Vitals   12/03/17 1118  BP: 138/78  Pulse: 78  Temp: 98 F (36.7 C)  TempSrc: Oral  SpO2: 97%  Weight: 198 lb 6.4 oz (90 kg)  Height: 5\' 4"  (1.626 m)   Body mass index is 34.06 kg/m.   Objective:  Physical Exam  Constitutional: She appears well-developed and well-nourished.  HENT:  Head: Normocephalic and atraumatic.  Cardiovascular: Normal rate, regular rhythm and normal heart sounds.  Pulmonary/Chest: Effort normal and breath sounds normal.  Abdominal: Soft. Normal appearance and bowel sounds are normal. There is tenderness in the suprapubic area.  Nursing note and vitals reviewed.       Assessment And Plan:     1. Dysuria  She will try AZO otc. I will send off urine culture. I will make further recommendations once her results are available. She is encouraged to stay well hydrated.   - Ambulatory referral to Urology  2. Gross hematuria  Recurrent. I will refer her to Urology for further evaluation. I will also schedule her for renal u/s. She is in agreement with her treatment plan.   - US Renal; Future - Ambulatory  referral to Urology   3. Essential hypertension, benign  Fair control. She will continue with current meds for now.  She is encouraged to incorporate some stress-relieving techniques into her daily routine.     Gwynneth Aliment, MD

## 2017-12-04 LAB — URINE CULTURE

## 2017-12-04 NOTE — Progress Notes (Signed)
Your urine culture is negative. How are you feeling? Did you try the Azo? Pls let me know if you have not heard from anyone regarding ultrasound by next Monday.

## 2017-12-07 ENCOUNTER — Telehealth: Payer: Self-pay

## 2017-12-07 NOTE — Telephone Encounter (Signed)
Left the pt a message to call the office back for lab results. 

## 2017-12-07 NOTE — Telephone Encounter (Signed)
-----   Message from Glendale Chard, MD sent at 12/04/2017  8:35 PM EST ----- Your urine culture is negative. How are you feeling? Did you try the Azo? Pls let me know if you have not heard from anyone regarding ultrasound by next Monday.

## 2017-12-11 ENCOUNTER — Ambulatory Visit
Admission: RE | Admit: 2017-12-11 | Discharge: 2017-12-11 | Disposition: A | Discharge status: Home / Self Care | Payer: Medicare Other | Source: Ambulatory Visit | Attending: Internal Medicine | Admitting: Internal Medicine

## 2017-12-11 ENCOUNTER — Other Ambulatory Visit: Payer: BC Managed Care – PPO

## 2017-12-11 DIAGNOSIS — R310 Gross hematuria: Secondary | ICD-10-CM

## 2017-12-11 NOTE — Progress Notes (Signed)
Renal ultrasound results:  Nodule located in left kidney, needs f/u u/s in 4-6 months for re-evaluation.

## 2017-12-12 ENCOUNTER — Ambulatory Visit: Payer: Self-pay | Admitting: Internal Medicine

## 2017-12-13 ENCOUNTER — Other Ambulatory Visit: Payer: BC Managed Care – PPO

## 2017-12-15 NOTE — Progress Notes (Signed)
Why would she continue with Azo? Is she experiencing discomfort still? If yes, I will refer to Urology for further evaluation.

## 2017-12-17 ENCOUNTER — Other Ambulatory Visit: Payer: Self-pay | Admitting: *Deleted

## 2017-12-17 DIAGNOSIS — C9000 Multiple myeloma not having achieved remission: Secondary | ICD-10-CM

## 2017-12-17 MED ORDER — POMALIDOMIDE 1 MG PO CAPS: ORAL_CAPSULE | ORAL | 0 refills | Status: DC

## 2017-12-26 ENCOUNTER — Inpatient Hospital Stay: Payer: Medicare Other

## 2017-12-26 ENCOUNTER — Encounter: Payer: Self-pay | Admitting: Hematology & Oncology

## 2017-12-26 ENCOUNTER — Inpatient Hospital Stay: Payer: Medicare Other | Attending: Hematology & Oncology | Admitting: Hematology & Oncology

## 2017-12-26 VITALS — BP 135/69 | HR 75 | Temp 99.3°F | Resp 17 | Ht 64.0 in | Wt 192.8 lb

## 2017-12-26 DIAGNOSIS — C9000 Multiple myeloma not having achieved remission: Secondary | ICD-10-CM | POA: Insufficient documentation

## 2017-12-26 DIAGNOSIS — Z79811 Long term (current) use of aromatase inhibitors: Secondary | ICD-10-CM

## 2017-12-26 DIAGNOSIS — D500 Iron deficiency anemia secondary to blood loss (chronic): Secondary | ICD-10-CM

## 2017-12-26 DIAGNOSIS — Z7982 Long term (current) use of aspirin: Secondary | ICD-10-CM

## 2017-12-26 DIAGNOSIS — C9001 Multiple myeloma in remission: Secondary | ICD-10-CM | POA: Diagnosis not present

## 2017-12-26 DIAGNOSIS — R319 Hematuria, unspecified: Secondary | ICD-10-CM | POA: Diagnosis not present

## 2017-12-26 DIAGNOSIS — C541 Malignant neoplasm of endometrium: Secondary | ICD-10-CM

## 2017-12-26 DIAGNOSIS — Z95828 Presence of other vascular implants and grafts: Secondary | ICD-10-CM

## 2017-12-26 DIAGNOSIS — Z923 Personal history of irradiation: Secondary | ICD-10-CM

## 2017-12-26 DIAGNOSIS — Z79899 Other long term (current) drug therapy: Secondary | ICD-10-CM | POA: Diagnosis not present

## 2017-12-26 LAB — CBC WITH DIFFERENTIAL (CANCER CENTER ONLY)
Abs Immature Granulocytes: 0.02 10*3/uL (ref 0.00–0.07)
Basophils Absolute: 0.1 10*3/uL (ref 0.0–0.1)
Basophils Relative: 3 %
Eosinophils Absolute: 0.2 10*3/uL (ref 0.0–0.5)
Eosinophils Relative: 7 %
HCT: 34.7 % — ABNORMAL LOW (ref 36.0–46.0)
Hemoglobin: 11 g/dL — ABNORMAL LOW (ref 12.0–15.0)
Immature Granulocytes: 1 %
Lymphocytes Relative: 21 %
Lymphs Abs: 0.8 10*3/uL (ref 0.7–4.0)
MCH: 27.5 pg (ref 26.0–34.0)
MCHC: 31.7 g/dL (ref 30.0–36.0)
MCV: 86.8 fL (ref 80.0–100.0)
Monocytes Absolute: 0.7 10*3/uL (ref 0.1–1.0)
Monocytes Relative: 18 %
Neutro Abs: 1.8 10*3/uL (ref 1.7–7.7)
Neutrophils Relative %: 50 %
Platelet Count: 207 10*3/uL (ref 150–400)
RBC: 4 MIL/uL (ref 3.87–5.11)
RDW: 15.9 % — ABNORMAL HIGH (ref 11.5–15.5)
WBC Count: 3.6 10*3/uL — ABNORMAL LOW (ref 4.0–10.5)
nRBC: 0 % (ref 0.0–0.2)

## 2017-12-26 LAB — CMP (CANCER CENTER ONLY)
ALT: 14 U/L (ref 0–44)
AST: 11 U/L — ABNORMAL LOW (ref 15–41)
Albumin: 4.5 g/dL (ref 3.5–5.0)
Alkaline Phosphatase: 35 U/L — ABNORMAL LOW (ref 38–126)
Anion gap: 9 (ref 5–15)
BUN: 14 mg/dL (ref 8–23)
CO2: 29 mmol/L (ref 22–32)
Calcium: 9.1 mg/dL (ref 8.9–10.3)
Chloride: 104 mmol/L (ref 98–111)
Creatinine: 0.83 mg/dL (ref 0.44–1.00)
GFR, Est AFR Am: >60 mL/min (ref >60)
GFR, Estimated: >60 mL/min (ref >60)
Glucose, Bld: 95 mg/dL (ref 70–99)
Potassium: 3.4 mmol/L — ABNORMAL LOW (ref 3.5–5.1)
Sodium: 142 mmol/L (ref 135–145)
Total Bilirubin: 0.4 mg/dL (ref 0.3–1.2)
Total Protein: 6.3 g/dL — ABNORMAL LOW (ref 6.5–8.1)

## 2017-12-26 MED ORDER — SODIUM CHLORIDE 0.9% FLUSH
10.0000 mL | INTRAVENOUS | Status: DC | PRN
  Administered 2017-12-26: 10 mL via INTRAVENOUS
  Filled 2017-12-26: qty 10

## 2017-12-26 MED ORDER — HEPARIN SOD (PORK) LOCK FLUSH 100 UNIT/ML IV SOLN
500.0000 [IU] | Freq: Once | INTRAVENOUS | Status: AC
  Administered 2017-12-26: 500 [IU] via INTRAVENOUS
  Filled 2017-12-26: qty 5

## 2017-12-26 NOTE — Progress Notes (Signed)
Hematology and Oncology Follow Up Visit  Emily Greer [id number] Oct 30, 1950 67 y.o. 12/26/2017   Principle Diagnosis:  Recurrent lambda light chain myeloma History of recurrent endometrial carcinoma Iron deficiency anemia -blood loss  PastTherapy: Status post second autologous stem cell transplant on 07/24/2014 Maintenance therapy with Pomalidomide/every 2 week Velcade - d/c'ed Xgeva 120 mg subcutaneous every 3months -next dose inNovember 2019 Radiation therapy for endometrial recurrence - completed 04/20/2015  Current Therapy:   Pomalyst/Kyprolis 70mg /m2 IV q 2 weeks - s/p cycle #12 -- held on 12/26/2017 for hematuria Femara 2.5 mg po q day  IV iron as indicated   Interim History:  Emily Greer is here today for follow-up and treatment.  Unfortunately, we have a problem.  The problem is that she is having hematuria.  She has had this for several weeks.  I am not sure as to why she is having this.  She has not had any obvious infection.  There is no pain with the hematuria.  She had ultrasound done of the kidneys.  The ultrasound showed a questionable 11 mm nodule in the left kidney.  From what I could tell, there is nothing that looked suspicious.  The kidneys were not enlarged.  She apparently has an appointment with the urologist in December.  The appointment is December 10.  I am going to try to move this appointment up.  We really need to figure out what is going on with the hematuria before we can do any treatment on her.  As far as her light chain myeloma goes, we might be in a situation with that in which her light chain level has been going up slowly.  When we had the light chain checked in October, the lambda light chain was 7 mg/dL.  She had a 24-hour urine done in early October.  This showed 144 mg/L of light chain.  At some point, we may have to consider changing her therapy.  I have not yet used 1 of the monoclonal antibodies.  We do not have  her on high dose Kyprolis.  I do not have her on Cytoxan with Kyprolis.  Regardless, the hematuria must be figured out before we can proceed with any treatment for the myeloma.  I would not think that she would have amyloidosis causing this hematuria.  However, I have to keep this possibility in mind.  I would think that biopsies will have to be done when she sees urology.  There has been a death in the family.  I think her father passed on.  He was 28 years old.  They are going down to [place] today or on Thanksgiving morning to pick up her mom.  She has had no fever.  She has had no cough.  Of note, she had radiation therapy after her hysterectomy for the uterine cancer.  I do not know if this might be a factor with the hematuria.  She is on Femara.  She is doing well with Femara.  Overall, her performance status today is ECOG 1.     Medications:  Allergies as of 12/26/2017      Reactions   Codeine Nausea Only      Medication List        Accurate as of 12/26/17 10:48 AM. Always use your most recent med list.          acyclovir 400 MG tablet Commonly known as:  ZOVIRAX Take 1 tablet (400 mg total) by mouth daily.  albuterol 108 (90 Base) MCG/ACT inhaler Commonly known as:  PROVENTIL HFA;VENTOLIN HFA Inhale 2 puffs into the lungs every 6 (six) hours as needed for wheezing or shortness of breath. 2 puffs 3 times daily x 5 days then every 6 hours as needed.   amLODipine 10 MG tablet Commonly known as:  NORVASC TAKE 1 TABLET BY MOUTH DAILY--- takes in am   aspirin EC 81 MG tablet Take 81 mg by mouth 2 (two) times daily.   granisetron 3.1 MG/24HR Commonly known as:  SANCUSO Apply to skin starting 24 hours before chemotherapy. Remove after 7 days.   letrozole 2.5 MG tablet Commonly known as:  FEMARA Take 1 tablet (2.5 mg total) by mouth daily.   lidocaine-prilocaine cream Commonly known as:  EMLA Apply to affected area once   loperamide 2 MG  capsule Commonly known as:  IMODIUM Take by mouth as needed for diarrhea or loose stools. Reported on 08/19/2015   loratadine 10 MG tablet Commonly known as:  CLARITIN Take 10 mg by mouth every morning. Reported on 08/19/2015   NASACORT ALLERGY 24HR 55 MCG/ACT Aero nasal inhaler Generic drug:  triamcinolone Place 2 sprays into the nose daily.   NOREL AD 4-10-325 MG Tabs Generic drug:  Chlorphen-PE-Acetaminophen Take by mouth as needed.   ondansetron 8 MG tablet Commonly known as:  ZOFRAN Take 1 tablet (8 mg total) by mouth 2 (two) times daily as needed (Nausea or vomiting).   pomalidomide 1 MG capsule Commonly known as:  POMALYST Take with water on days 1-21. Repeat every 28 days. AUTH [id number]   potassium chloride SA 20 MEQ tablet Commonly known as:  K-DUR,KLOR-CON TAKE 1 TABLET(20 MEQ) BY MOUTH TWICE DAILY   prochlorperazine 10 MG tablet Commonly known as:  COMPAZINE Take 1 tablet (10 mg total) by mouth every 6 (six) hours as needed (Nausea or vomiting).   triamterene-hydrochlorothiazide 37.5-25 MG tablet Commonly known as:  MAXZIDE-25 TK 1 T PO QD   Vitamin D3 50 MCG (2000 UT) Tabs Take 2 tablets by mouth daily.       Allergies:  Allergies  Allergen Reactions  . Codeine Nausea Only    Past Medical History, Surgical history, Social history, and Family History were reviewed and updated.  Review of Systems: Review of Systems  Constitutional: Negative.   HENT: Negative.   Eyes: Negative.   Respiratory: Negative.   Cardiovascular: Negative.   Gastrointestinal: Negative.   Genitourinary: Positive for hematuria.  Musculoskeletal: Negative.   Skin: Negative.   Neurological: Negative.   Endo/Heme/Allergies: Bruises/bleeds easily.  Psychiatric/Behavioral: Negative.    [name] Kitchen   Physical Exam:  vitals were not taken for this visit.   Wt Readings from Last 3 Encounters:  12/03/17 198 lb 6.4 oz (90 kg)  11/28/17 200 lb (90.7 kg)  11/03/17 198 lb (89.8 kg)     Her vital signs that were taken show temperature of 99.3.  Pulse 75.  Blood pressure 135/67.  Weight is 192 pounds.  Physical Exam  Constitutional: She is oriented to person, place, and time.  HENT:  Head: Normocephalic and atraumatic.  Mouth/Throat: Oropharynx is clear and moist.  Eyes: Pupils are equal, round, and reactive to light. EOM are normal.  Neck: Normal range of motion.  Cardiovascular: Normal rate, regular rhythm and normal heart sounds.  Pulmonary/Chest: Effort normal and breath sounds normal.  Abdominal: Soft. Bowel sounds are normal.  Musculoskeletal: Normal range of motion. She exhibits no edema, tenderness or deformity.  Lymphadenopathy:    She  has no cervical adenopathy.  Neurological: She is alert and oriented to person, place, and time.  Skin: Skin is warm and dry. No rash noted. No erythema.  Psychiatric: She has a normal mood and affect. Her behavior is normal. Judgment and thought content normal.  Vitals reviewed.    Lab Results  Component Value Date   WBC 3.6 (L) 12/26/2017   HGB 11.0 (L) 12/26/2017   HCT 34.7 (L) 12/26/2017   MCV 86.8 12/26/2017   PLT 207 12/26/2017   Lab Results  Component Value Date   FERRITIN 804 (H) 11/28/2017   IRON 107 11/28/2017   TIBC 344 11/28/2017   UIBC 237 11/28/2017   IRONPCTSAT 31 11/28/2017   Lab Results  Component Value Date   RETICCTPCT 2.9 (H) 10/17/2017   RBC 4.00 12/26/2017   Lab Results  Component Value Date   KPAFRELGTCHN 6.9 11/28/2017   LAMBDASER 63.9 (H) 11/28/2017   KAPLAMBRATIO 0.11 (L) 11/28/2017   Lab Results  Component Value Date   IGGSERUM 502 (L) 11/28/2017   IGA 51 (L) 11/28/2017   IGMSERUM 13 (L) 11/28/2017   Lab Results  Component Value Date   TOTALPROTELP 5.8 (L) 11/28/2017   ALBUMINELP 3.8 11/28/2017   A1GS 0.1 11/28/2017   A2GS 0.7 11/28/2017   BETS 0.9 11/28/2017   BETA2SER 0.4 11/23/2014   GAMS 0.3 (L) 11/28/2017   MSPIKE Not Observed 11/28/2017   SPEI Comment  09/18/2017     Chemistry      Component Value Date/Time   NA 139 11/28/2017 1053   NA 141 01/10/2017 1115   NA 140 06/21/2016 0918   K 3.4 11/28/2017 1053   K 4.0 01/10/2017 1115   K 4.3 06/21/2016 0918   CL 113 (H) 11/28/2017 1053   CL 106 01/10/2017 1115   CO2 28 11/28/2017 1053   CO2 27 01/10/2017 1115   CO2 20 (L) 06/21/2016 0918   BUN 14 11/28/2017 1053   BUN 15 01/10/2017 1115   BUN 15.8 06/21/2016 0918   CREATININE 1.00 11/28/2017 1053   CREATININE 1.0 01/10/2017 1115   CREATININE 0.8 06/21/2016 0918      Component Value Date/Time   CALCIUM 9.3 11/28/2017 1053   CALCIUM 9.5 01/10/2017 1115   CALCIUM 9.4 06/21/2016 0918   ALKPHOS 34 11/28/2017 1053   ALKPHOS 40 01/10/2017 1115   ALKPHOS 66 06/21/2016 0918   AST 20 11/28/2017 1053   AST 17 06/21/2016 0918   ALT 32 11/28/2017 1053   ALT 19 01/10/2017 1115   ALT 37 06/21/2016 0918   BILITOT 0.5 11/28/2017 1053   BILITOT 0.32 06/21/2016 0918       Impression and Plan: Emily Greer is a very pleasant 67 yo African American female with recurrent lambda light chain myeloma and also localized recurrent endometrial cancer (followed by gyn onc Dr. Nelly Rout).   Her second stem cell transplant for the light chain myeloma was in June 2016.   I am really worried about this hematuria.  Again, we cannot treat her for the myeloma until we get the hematuria resolved.  I will call [facility] and see if we cannot get her in sooner to have this evaluated.  I am sure that she will have a cystoscopy with biopsies.  If her myeloma studies show that her light chains are higher, then I will have to see what we can then do to try to help knock the light chains back down.  Her light chains, thankfully, are not that  bad right now that we would have to do anything emergently.  I will have her "take off" from treatment for a right now.  I told her to stop the pomalidomide.  She will also stop the low-dose aspirin.  I would like to  see her back in 3 weeks.   Josph Macho, MD 11/27/201910:48 AM

## 2017-12-26 NOTE — Addendum Note (Signed)
Addended by: Melton Krebs on: 12/26/2017 11:35 AM   Modules accepted: Orders, SmartSet

## 2017-12-27 LAB — IGG, IGA, IGM
IgA: 46 mg/dL — ABNORMAL LOW (ref 87–352)
IgG (Immunoglobin G), Serum: 546 mg/dL — ABNORMAL LOW (ref 700–1600)
IgM (Immunoglobulin M), Srm: 11 mg/dL — ABNORMAL LOW (ref 26–217)

## 2017-12-28 LAB — KAPPA/LAMBDA LIGHT CHAINS
Kappa free light chain: 6.1 mg/L (ref 3.3–19.4)
Kappa, lambda light chain ratio: 0.09 — ABNORMAL LOW (ref 0.26–1.65)
Lambda free light chains: 68.9 mg/L — ABNORMAL HIGH (ref 5.7–26.3)

## 2017-12-28 LAB — IRON AND TIBC
Iron: 72 ug/dL (ref 41–142)
Saturation Ratios: 20 % — ABNORMAL LOW (ref 21–57)
TIBC: 358 ug/dL (ref 236–444)
UIBC: 286 ug/dL (ref 120–384)

## 2017-12-28 LAB — FERRITIN: Ferritin: 692 ng/mL — ABNORMAL HIGH (ref 11–307)

## 2017-12-31 LAB — PROTEIN ELECTROPHORESIS, SERUM
A/G Ratio: 1.7 (ref 0.7–1.7)
Albumin ELP: 3.8 g/dL (ref 2.9–4.4)
Alpha-1-Globulin: 0.2 g/dL (ref 0.0–0.4)
Alpha-2-Globulin: 0.7 g/dL (ref 0.4–1.0)
Beta Globulin: 0.9 g/dL (ref 0.7–1.3)
Gamma Globulin: 0.4 g/dL (ref 0.4–1.8)
Globulin, Total: 2.3 g/dL (ref 2.2–3.9)
Total Protein ELP: 6.1 g/dL (ref 6.0–8.5)

## 2018-01-03 ENCOUNTER — Other Ambulatory Visit: Payer: Self-pay | Admitting: Hematology & Oncology

## 2018-01-03 DIAGNOSIS — C9001 Multiple myeloma in remission: Secondary | ICD-10-CM

## 2018-01-03 DIAGNOSIS — I10 Essential (primary) hypertension: Secondary | ICD-10-CM

## 2018-01-03 DIAGNOSIS — C9000 Multiple myeloma not having achieved remission: Secondary | ICD-10-CM

## 2018-01-08 DIAGNOSIS — R310 Gross hematuria: Secondary | ICD-10-CM | POA: Diagnosis not present

## 2018-01-16 ENCOUNTER — Inpatient Hospital Stay: Payer: Medicare Other

## 2018-01-16 ENCOUNTER — Telehealth: Payer: Self-pay | Admitting: Hematology & Oncology

## 2018-01-16 ENCOUNTER — Inpatient Hospital Stay: Payer: Medicare Other | Admitting: Hematology & Oncology

## 2018-01-16 NOTE — Telephone Encounter (Signed)
lmom to inform pt of r/s appts 12/18 to 02/06/18 at 130 pm per 12/18 sch msg

## 2018-01-21 DIAGNOSIS — R310 Gross hematuria: Secondary | ICD-10-CM | POA: Diagnosis not present

## 2018-01-24 DIAGNOSIS — R310 Gross hematuria: Secondary | ICD-10-CM | POA: Diagnosis not present

## 2018-01-31 ENCOUNTER — Encounter (HOSPITAL_BASED_OUTPATIENT_CLINIC_OR_DEPARTMENT_OTHER): Payer: Self-pay | Admitting: *Deleted

## 2018-01-31 ENCOUNTER — Other Ambulatory Visit: Payer: Self-pay

## 2018-01-31 ENCOUNTER — Other Ambulatory Visit: Payer: Self-pay | Admitting: Urology

## 2018-01-31 NOTE — Progress Notes (Signed)
Spoke with Emily Greer by phone, npo food after midnight, clear liquids until 830 am then npo. meds to take: albuterol inhaler prn and bring inhaler, amlodipine, letrozole, claritin. Needs cbc and istat 8 and ekg. Surgery orders in epic need 2nd sign. Driver spouse russell

## 2018-02-04 ENCOUNTER — Ambulatory Visit (HOSPITAL_BASED_OUTPATIENT_CLINIC_OR_DEPARTMENT_OTHER): Payer: Medicare Other | Admitting: Anesthesiology

## 2018-02-04 ENCOUNTER — Other Ambulatory Visit: Payer: Self-pay

## 2018-02-04 ENCOUNTER — Encounter (HOSPITAL_BASED_OUTPATIENT_CLINIC_OR_DEPARTMENT_OTHER): Payer: Self-pay | Admitting: *Deleted

## 2018-02-04 ENCOUNTER — Ambulatory Visit (HOSPITAL_BASED_OUTPATIENT_CLINIC_OR_DEPARTMENT_OTHER)
Admission: RE | Admit: 2018-02-04 | Discharge: 2018-02-04 | Disposition: A | Discharge status: Home / Self Care | Payer: Medicare Other | Attending: Urology | Admitting: Urology

## 2018-02-04 ENCOUNTER — Encounter (HOSPITAL_BASED_OUTPATIENT_CLINIC_OR_DEPARTMENT_OTHER)
Admission: RE | Disposition: A | Discharge status: Home / Self Care | Payer: Self-pay | Source: Home / Self Care | Attending: Urology

## 2018-02-04 DIAGNOSIS — Z90710 Acquired absence of both cervix and uterus: Secondary | ICD-10-CM | POA: Diagnosis not present

## 2018-02-04 DIAGNOSIS — I10 Essential (primary) hypertension: Secondary | ICD-10-CM | POA: Insufficient documentation

## 2018-02-04 DIAGNOSIS — R3129 Other microscopic hematuria: Secondary | ICD-10-CM | POA: Diagnosis not present

## 2018-02-04 DIAGNOSIS — Z8542 Personal history of malignant neoplasm of other parts of uterus: Secondary | ICD-10-CM | POA: Diagnosis not present

## 2018-02-04 DIAGNOSIS — D649 Anemia, unspecified: Secondary | ICD-10-CM | POA: Diagnosis not present

## 2018-02-04 DIAGNOSIS — Z885 Allergy status to narcotic agent status: Secondary | ICD-10-CM | POA: Insufficient documentation

## 2018-02-04 DIAGNOSIS — R9341 Abnormal radiologic findings on diagnostic imaging of renal pelvis, ureter, or bladder: Secondary | ICD-10-CM | POA: Diagnosis not present

## 2018-02-04 DIAGNOSIS — N329 Bladder disorder, unspecified: Secondary | ICD-10-CM | POA: Insufficient documentation

## 2018-02-04 DIAGNOSIS — N3289 Other specified disorders of bladder: Secondary | ICD-10-CM | POA: Diagnosis not present

## 2018-02-04 DIAGNOSIS — Z79899 Other long term (current) drug therapy: Secondary | ICD-10-CM | POA: Diagnosis not present

## 2018-02-04 DIAGNOSIS — C679 Malignant neoplasm of bladder, unspecified: Secondary | ICD-10-CM | POA: Diagnosis not present

## 2018-02-04 HISTORY — DX: Personal history of antineoplastic chemotherapy: Z92.21

## 2018-02-04 HISTORY — DX: Family history of other specified conditions: Z84.89

## 2018-02-04 HISTORY — PX: CYSTOSCOPY WITH RETROGRADE PYELOGRAM, URETEROSCOPY AND STENT PLACEMENT: SHX5789

## 2018-02-04 LAB — CBC
HCT: 34.3 % — ABNORMAL LOW (ref 36.0–46.0)
Hemoglobin: 10.9 g/dL — ABNORMAL LOW (ref 12.0–15.0)
MCH: 27.3 pg (ref 26.0–34.0)
MCHC: 31.8 g/dL (ref 30.0–36.0)
MCV: 85.8 fL (ref 80.0–100.0)
Platelets: 238 10*3/uL (ref 150–400)
RBC: 4 MIL/uL (ref 3.87–5.11)
RDW: 15.1 % (ref 11.5–15.5)
WBC: 3.8 10*3/uL — ABNORMAL LOW (ref 4.0–10.5)
nRBC: 0 % (ref 0.0–0.2)

## 2018-02-04 LAB — POCT I-STAT, CHEM 8
BUN: 13 mg/dL (ref 8–23)
Calcium, Ion: 1.19 mmol/L (ref 1.15–1.40)
Chloride: 105 mmol/L (ref 98–111)
Creatinine, Ser: 0.8 mg/dL (ref 0.44–1.00)
Glucose, Bld: 88 mg/dL (ref 70–99)
HCT: 34 % — ABNORMAL LOW (ref 36.0–46.0)
Hemoglobin: 11.6 g/dL — ABNORMAL LOW (ref 12.0–15.0)
Potassium: 3.3 mmol/L — ABNORMAL LOW (ref 3.5–5.1)
Sodium: 142 mmol/L (ref 135–145)
TCO2: 29 mmol/L (ref 22–32)

## 2018-02-04 SURGERY — CYSTOURETEROSCOPY, WITH RETROGRADE PYELOGRAM AND STENT INSERTION
Anesthesia: General | Site: Renal | Laterality: Left

## 2018-02-04 MED ORDER — IOHEXOL 300 MG/ML  SOLN
INTRAMUSCULAR | Status: DC | PRN
  Administered 2018-02-04: 5 mL

## 2018-02-04 MED ORDER — LIDOCAINE 2% (20 MG/ML) 5 ML SYRINGE
INTRAMUSCULAR | Status: DC | PRN
  Administered 2018-02-04: 60 mg via INTRAVENOUS

## 2018-02-04 MED ORDER — KETOROLAC TROMETHAMINE 30 MG/ML IJ SOLN
INTRAMUSCULAR | Status: DC | PRN
  Administered 2018-02-04: 30 mg via INTRAVENOUS

## 2018-02-04 MED ORDER — DEXAMETHASONE SODIUM PHOSPHATE 10 MG/ML IJ SOLN
INTRAMUSCULAR | Status: AC
  Filled 2018-02-04: qty 1

## 2018-02-04 MED ORDER — SODIUM CHLORIDE 0.9 % IR SOLN
Status: DC | PRN
  Administered 2018-02-04: 3000 mL

## 2018-02-04 MED ORDER — PROPOFOL 10 MG/ML IV BOLUS
INTRAVENOUS | Status: DC | PRN
  Administered 2018-02-04: 200 mg via INTRAVENOUS

## 2018-02-04 MED ORDER — ONDANSETRON HCL 4 MG/2ML IJ SOLN
INTRAMUSCULAR | Status: AC
  Filled 2018-02-04: qty 2

## 2018-02-04 MED ORDER — ONDANSETRON HCL 4 MG/2ML IJ SOLN
INTRAMUSCULAR | Status: DC | PRN
  Administered 2018-02-04: 4 mg via INTRAVENOUS

## 2018-02-04 MED ORDER — TRAMADOL HCL 50 MG PO TABS: 50.0000 mg | ORAL_TABLET | Freq: Four times a day (QID) | ORAL | 0 refills | Status: AC | PRN

## 2018-02-04 MED ORDER — MIDAZOLAM HCL 2 MG/2ML IJ SOLN
INTRAMUSCULAR | Status: AC
  Filled 2018-02-04: qty 2

## 2018-02-04 MED ORDER — KETOROLAC TROMETHAMINE 30 MG/ML IJ SOLN
INTRAMUSCULAR | Status: AC
  Filled 2018-02-04: qty 1

## 2018-02-04 MED ORDER — CEFAZOLIN SODIUM-DEXTROSE 2-4 GM/100ML-% IV SOLN
INTRAVENOUS | Status: AC
  Filled 2018-02-04: qty 100

## 2018-02-04 MED ORDER — DEXAMETHASONE SODIUM PHOSPHATE 10 MG/ML IJ SOLN
INTRAMUSCULAR | Status: DC | PRN
  Administered 2018-02-04: 5 mg via INTRAVENOUS

## 2018-02-04 MED ORDER — LACTATED RINGERS IV SOLN
INTRAVENOUS | Status: DC
  Administered 2018-02-04: 13:00:00 via INTRAVENOUS
  Filled 2018-02-04: qty 1000

## 2018-02-04 MED ORDER — FENTANYL CITRATE (PF) 100 MCG/2ML IJ SOLN
INTRAMUSCULAR | Status: DC | PRN
  Administered 2018-02-04: 50 ug via INTRAVENOUS

## 2018-02-04 MED ORDER — CEFAZOLIN SODIUM-DEXTROSE 2-4 GM/100ML-% IV SOLN
2.0000 g | INTRAVENOUS | Status: AC
  Administered 2018-02-04: 2 g via INTRAVENOUS
  Filled 2018-02-04: qty 100

## 2018-02-04 MED ORDER — PROPOFOL 10 MG/ML IV BOLUS
INTRAVENOUS | Status: AC
  Filled 2018-02-04: qty 20

## 2018-02-04 MED ORDER — LIDOCAINE 2% (20 MG/ML) 5 ML SYRINGE
INTRAMUSCULAR | Status: AC
  Filled 2018-02-04: qty 5

## 2018-02-04 MED ORDER — MIDAZOLAM HCL 2 MG/2ML IJ SOLN
INTRAMUSCULAR | Status: DC | PRN
  Administered 2018-02-04: 1 mg via INTRAVENOUS

## 2018-02-04 MED ORDER — ONDANSETRON HCL 4 MG/2ML IJ SOLN
4.0000 mg | Freq: Once | INTRAMUSCULAR | Status: DC | PRN
  Filled 2018-02-04: qty 2

## 2018-02-04 MED ORDER — STERILE WATER FOR IRRIGATION IR SOLN
Status: DC | PRN
  Administered 2018-02-04: 3000 mL

## 2018-02-04 MED ORDER — FENTANYL CITRATE (PF) 100 MCG/2ML IJ SOLN
INTRAMUSCULAR | Status: AC
  Filled 2018-02-04: qty 2

## 2018-02-04 MED ORDER — FENTANYL CITRATE (PF) 100 MCG/2ML IJ SOLN
25.0000 ug | INTRAMUSCULAR | Status: DC | PRN
  Filled 2018-02-04: qty 1

## 2018-02-04 SURGICAL SUPPLY — 32 items
BAG DRAIN URO-CYSTO SKYTR STRL (DRAIN) ×2 IMPLANT
BAG DRN ANRFLXCHMBR STRAP LEK (BAG) ×1
BAG DRN UROCATH (DRAIN) ×1
BAG URINE LEG 19OZ MD ST LTX (BAG) ×1 IMPLANT
BASKET STONE 1.7 NGAGE (UROLOGICAL SUPPLIES) IMPLANT
CATH FOLEY 2WAY SLVR  5CC 16FR (CATHETERS) ×1
CATH FOLEY 2WAY SLVR 5CC 16FR (CATHETERS) IMPLANT
CATH INTERMIT  6FR 70CM (CATHETERS) IMPLANT
CLOTH BEACON ORANGE TIMEOUT ST (SAFETY) ×2 IMPLANT
EXTRACTOR STONE 1.7FRX115CM (UROLOGICAL SUPPLIES) IMPLANT
FIBER LASER FLEXIVA 365 (UROLOGICAL SUPPLIES) IMPLANT
FIBER LASER TRAC TIP (UROLOGICAL SUPPLIES) IMPLANT
GLOVE BIO SURGEON STRL SZ 6.5 (GLOVE) ×1 IMPLANT
GLOVE BIO SURGEON STRL SZ8 (GLOVE) ×2 IMPLANT
GLOVE BIOGEL PI IND STRL 6.5 (GLOVE) IMPLANT
GLOVE BIOGEL PI INDICATOR 6.5 (GLOVE) ×2
GOWN STRL REUS W/TWL XL LVL3 (GOWN DISPOSABLE) ×2 IMPLANT
GUIDEWIRE ANG ZIPWIRE 038X150 (WIRE) ×2 IMPLANT
GUIDEWIRE STR DUAL SENSOR (WIRE) IMPLANT
INFUSOR MANOMETER BAG 3000ML (MISCELLANEOUS) ×2 IMPLANT
IV NS 1000ML (IV SOLUTION)
IV NS 1000ML BAXH (IV SOLUTION) ×1 IMPLANT
IV NS IRRIG 3000ML ARTHROMATIC (IV SOLUTION) ×2 IMPLANT
KIT TURNOVER CYSTO (KITS) ×2 IMPLANT
MANIFOLD NEPTUNE II (INSTRUMENTS) ×2 IMPLANT
NS IRRIG 500ML POUR BTL (IV SOLUTION) ×2 IMPLANT
PACK CYSTO (CUSTOM PROCEDURE TRAY) ×2 IMPLANT
STENT URET 6FRX26 CONTOUR (STENTS) IMPLANT
SYR 10ML LL (SYRINGE) ×2 IMPLANT
TUBE CONNECTING 12X1/4 (SUCTIONS) ×1 IMPLANT
TUBING UROLOGY SET (TUBING) ×2 IMPLANT
WATER STERILE IRR 3000ML UROMA (IV SOLUTION) ×1 IMPLANT

## 2018-02-04 NOTE — Anesthesia Postprocedure Evaluation (Signed)
Anesthesia Post Note  Patient: Emily Greer  Procedure(s) Performed: CYSTOSCOPY WITH RETROGRADE PYELOGRAM, URETEROSCOPY , bladder biopsy and fulgeration (Left Renal)     Patient location during evaluation: PACU Anesthesia Type: General Level of consciousness: awake and alert Pain management: pain level controlled Vital Signs Assessment: post-procedure vital signs reviewed and stable Respiratory status: spontaneous breathing, nonlabored ventilation, respiratory function stable and patient connected to nasal cannula oxygen Cardiovascular status: blood pressure returned to baseline and stable Postop Assessment: no apparent nausea or vomiting Anesthetic complications: no    Last Vitals:  Vitals:   02/04/18 1630 02/04/18 1707  BP: (!) 161/76 (!) 172/84  Pulse: 67 68  Resp: 19 20  Temp:  36.8 C  SpO2: 99% 100%    Last Pain:  Vitals:   02/04/18 1707  TempSrc:   PainSc: 0-No pain                 Latalia Etzler S

## 2018-02-04 NOTE — H&P (Signed)
Urology Admission H&P  Chief Complaint: microhematuria  History of Present Illness: Emily Greer is a 68yo with a hx of microhematuria who was found to have a left distal ureteral filling defect on Ct hematuria study. No recent gross hematuria. No LUTS. No fevers/chills/sweats. No nausea/vomiting  Past Medical History:  Diagnosis Date  . Avascular necrosis of femoral head ([place])    bilateral per CT 07-26-2015  . Endometrial carcinoma [facility]) gyn oncologist-  dr Skeet Latch ([facility])/  radiation oncologist-- dr Sondra Come   2013 dx  FIGO Stage 1A, Grade 2 endometrioid endometrial cancer s/p TAH w/ BSO and bilateral pelvic node dissection 10-31-2011 ;  recurrence at distal vagina 04/ 2014 s/p  brachytherapy (ended 07-29-2012);  2nd recurrence 12/ 2016  vaginal apex s/p  conformational radiotherapy 03-10-2015 to 04-20-2015  . Family history of adverse reaction to anesthesia    mother ponv  . GERD (gastroesophageal reflux disease)   . H/O stem cell transplant ([place])    02/ 2000 and second one 06/ 2016  . 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 History of chemotherapy    last chemo 12-26-17  . History of radiation therapy 6/4, 6/11, 6/19, 6/25, 07/29/2012   vagina 30.5 gray in 5 fx, HDR brachytherapy:   last radiation to vagina 03-10-2015 to 04-20-2015  50.4gray  . History of radiation therapy 03/10/15-04/20/15   vagina 50.4 in 28 fractions  . Hypertension   . Lambda light chain myeloma [facility]) oncologist-  dr Marin Olp ([facility])  and  [facility] -- dr Salena Saner gasparetto   dx 07/ 1999 s/p  VAD chemotherapy 11/ 1999,  purged autotransplant 03-21-1998 followed by auto stem cell transplant 03-29-1998;  recurrance w/ second autologous stem cell transplant 07-24-2014;  in Re-mission currently , chemo maintenance therapy  . Osteoporosis 12/18/05   Increased  risk   . PONV (postoperative nausea and vomiting)   . Wears glasses    Past Surgical History:   Procedure Laterality Date  . ABDOMINAL HYSTERECTOMY  10/31/2011   Procedure: HYSTERECTOMY ABDOMINAL;  Surgeon: [name], MD PHD;  Location: [facility];  Service: Gynecology;  Laterality: N/A;  . CO2 LASER APPLICATION N/A 1/61/0960   Procedure: CO2 LASER APPLICATION;  Surgeon: [name], MD;  Location: [facility];  Service: Gynecology;  Laterality: N/A;  . ECTOPIC PREGNANCY SURGERY  1992  . HYSTEROSCOPY W/D&C  09/27/2011   Procedure: DILATATION AND CURETTAGE /HYSTEROSCOPY;  Surgeon: Eldred Manges, MD;  Location: [facility];  Service: Gynecology;;  . IR FLUORO GUIDE PORT INSERTION RIGHT  09/08/2016  . IR US GUIDE VASC ACCESS RIGHT  09/08/2016  . LAPAROTOMY  10/31/2011   Procedure: EXPLORATORY LAPAROTOMY;  Surgeon: [name], MD PHD;  Location: [facility];  Service: Gynecology;  Laterality: N/A;  . SALPINGOOPHORECTOMY  10/31/2011   Procedure: SALPINGO OOPHERECTOMY;  Surgeon: [name], MD PHD;  Location: [facility];  Service: Gynecology;  Laterality: Bilateral;   Lymph Nodes sampling  . TUBAL LIGATION  1986    Home Medications:  Current Facility-Administered Medications  Medication Dose Route Frequency Provider Last Rate Last Dose  . ceFAZolin (ANCEF) IVPB 2g/100 mL premix  2 g Intravenous 30 min Pre-Op , Candee Furbish, MD      . lactated ringers infusion   Intravenous Continuous Ellender, Karyl Kinnier, MD 50 mL/hr at 02/04/18 1324     Facility-Administered Medications Ordered in Other Encounters  Medication Dose Route Frequency Provider Last Rate Last Dose  .  sodium chloride flush (NS) 0.9 % injection 10 mL  10 mL Intravenous PRN Eliezer Bottom, NP   10 mL at 12/26/17 1130   Allergies:  Allergies  Allergen Reactions  . Codeine Nausea Only    Family History  Problem Relation Age of Onset  . Colon cancer Mother   . Hypertension Father   . Heart Problems Father    Social History:  reports that she has never smoked. She has never used smokeless tobacco. She  reports current alcohol use of about 1.0 standard drinks of alcohol per week. She reports that she does not use drugs.  Review of Systems  All other systems reviewed and are negative.   Physical Exam:  Vital signs in last 24 hours: Temp:  [97.8 F (36.6 C)] 97.8 F (36.6 C) (01/06 1241) Pulse Rate:  [78] 78 (01/06 1241) Resp:  [16] 16 (01/06 1241) BP: (143)/(76) 143/76 (01/06 1241) SpO2:  [99 %] 99 % (01/06 1241) Weight:  [85.8 kg] 85.8 kg (01/06 1241) Physical Exam  Constitutional: She is oriented to person, place, and time. She appears well-developed and well-nourished.  HENT:  Head: Normocephalic and atraumatic.  Eyes: Pupils are equal, round, and reactive to light. EOM are normal.  Neck: Normal range of motion. No thyromegaly present.  Cardiovascular: Normal rate and regular rhythm.  Respiratory: Effort normal. No respiratory distress.  GI: Soft. She exhibits no distension.  Musculoskeletal: Normal range of motion.        General: No edema.  Neurological: She is alert and oriented to person, place, and time.  Skin: Skin is warm and dry.  Psychiatric: She has a normal mood and affect. Her behavior is normal. Judgment and thought content normal.    Laboratory Data:  Results for orders placed or performed during the hospital encounter of 02/04/18 (from the past 24 hour(s))  CBC     Status: Abnormal   Collection Time: 02/04/18  1:20 PM  Result Value Ref Range   WBC 3.8 (L) 4.0 - 10.5 K/uL   RBC 4.00 3.87 - 5.11 MIL/uL   Hemoglobin 10.9 (L) 12.0 - 15.0 g/dL   HCT 34.3 (L) 36.0 - 46.0 %   MCV 85.8 80.0 - 100.0 fL   MCH 27.3 26.0 - 34.0 pg   MCHC 31.8 30.0 - 36.0 g/dL   RDW 15.1 11.5 - 15.5 %   Platelets 238 150 - 400 K/uL   nRBC 0.0 0.0 - 0.2 %  I-STAT, chem 8     Status: Abnormal   Collection Time: 02/04/18  1:21 PM  Result Value Ref Range   Sodium 142 135 - 145 mmol/L   Potassium 3.3 (L) 3.5 - 5.1 mmol/L   Chloride 105 98 - 111 mmol/L   BUN 13 8 - 23 mg/dL    Creatinine, Ser 0.80 0.44 - 1.00 mg/dL   Glucose, Bld 88 70 - 99 mg/dL   Calcium, Ion 1.19 1.15 - 1.40 mmol/L   TCO2 29 22 - 32 mmol/L   Hemoglobin 11.6 (L) 12.0 - 15.0 g/dL   HCT 34.0 (L) 36.0 - 46.0 %   *Note: Due to a large number of results and/or encounters for the requested time period, some results have not been displayed. A complete set of results can be found in Results Review.   No results found for this or any previous visit (from the past 240 hour(s)). Creatinine: Recent Labs    02/04/18 1321  CREATININE 0.80   Baseline Creatinine: 0.8  Impression/Assessment:  67yo with microheamturia and left distal ureteral filling defect  Plan:  The risks/benefits/alternatives to left diagnostic ureteroscopy with possible ureteral biopsy was explained to the patient and she understands and wishes to proceed with surgery  Nicolette Bang 02/04/2018, 3:08 PM

## 2018-02-04 NOTE — Transfer of Care (Signed)
Immediate Anesthesia Transfer of Care Note  Patient: Emily Greer  Procedure(s) Performed: CYSTOSCOPY WITH RETROGRADE PYELOGRAM, URETEROSCOPY , bladder biopsy and fulgeration (Left Renal)  Patient Location: PACU  Anesthesia Type:General  Level of Consciousness: awake, alert , oriented and patient cooperative  Airway & Oxygen Therapy: Patient Spontanous Breathing and Patient connected to nasal cannula oxygen  Post-op Assessment: Report given to RN and Post -op Vital signs reviewed and stable  Post vital signs: Reviewed and stable  Last Vitals:  Vitals Value Taken Time  BP    Temp    Pulse 80 02/04/2018  4:03 PM  Resp    SpO2 100 % 02/04/2018  4:03 PM  Vitals shown include unvalidated device data.  Last Pain:  Vitals:   02/04/18 1334  TempSrc:   PainSc: 0-No pain      Patients Stated Pain Goal: 7 (17/49/44 9675)  Complications: No apparent anesthesia complications

## 2018-02-04 NOTE — Anesthesia Procedure Notes (Signed)
Procedure Name: LMA Insertion Date/Time: 02/04/2018 3:38 PM Performed by: Wanita Chamberlain, CRNA Pre-anesthesia Checklist: Emergency Drugs available, Patient identified, Suction available, Patient being monitored and Timeout performed Patient Re-evaluated:Patient Re-evaluated prior to induction Oxygen Delivery Method: Circle system utilized Preoxygenation: Pre-oxygenation with 100% oxygen Induction Type: IV induction Ventilation: Mask ventilation without difficulty LMA: LMA inserted LMA Size: 4.0 Number of attempts: 1 Placement Confirmation: breath sounds checked- equal and bilateral,  CO2 detector and positive ETCO2 Tube secured with: Tape Dental Injury: Teeth and Oropharynx as per pre-operative assessment

## 2018-02-04 NOTE — Op Note (Signed)
Preoperative diagnosis: Left ureteral filling defect  Postoperative diagnosis: erythematous lesions concerning for bladder cancer  Procedure: 1 cystoscopy 2.  left retrograde pyelography 3.  Intraoperative fluoroscopy, under one hour, with interpretation 4.  Left diagnostic ureteroscopy 5. Bladder biopsy with fulgeration  Attending: Rosie Fate  Anesthesia: General  Estimated blood loss: None  Drains: 16 french foley  Specimens: posterior bladder lesions x 2  Antibiotics: ancef  Findings: no filling defects or hydronephrosis on retrograde pyelogram. No tumor visualized on left ureteroscopy. Posterior bladder erythematous lesions 1cm.  Indications: Patient is a 68 year old female with a history of hematuria and a left ureteral filling defect on CT.  After discussing treatment options, she decided proceed with left diagnostic ureteroscopy  Procedure her in detail: The patient was brought to the operating room and a brief timeout was done to ensure correct patient, correct procedure, correct site.  General anesthesia was administered patient was placed in dorsal lithotomy position.  Her genitalia was then prepped and draped in usual sterile fashion.  A rigid 29 French cystoscope was passed in the urethra and the bladder.  Bladder was inspected for masses/lesions and we noted a 1cm erythematous lesion on the posterior wall.  the ureteral orifices were in the normal orthotopic locations.  a 6 french ureteral catheter was then instilled into the left ureter orifice.  a gentle retrograde was obtained and findings noted above.  we then placed a zip wire through the ureteral catheter and advanced up to the renal pelvis.  we then removed the cystoscope and cannulated the left ureteral orifice with a semirigid ureteroscope.  we then performed ureteroscopy up to the level of the UPJ. No stone or tumor was encountered.  We then removed the wire and ureteroscope. We then used the biopsy forceps to  obtain 2 biopsies of the posterior wall lesion. Hemostasis was obtained with electrocautery. A 16 french foley was then placed. the bladder was then drained and this concluded the procedure which was well tolerated by patient.  Complications: None  Condition: Stable, extubated, transferred to PACU  Plan: Pt is to followup in 4-5 days for a voiding trial and pathology discussion

## 2018-02-04 NOTE — Anesthesia Preprocedure Evaluation (Addendum)
Anesthesia Evaluation  Patient identified by MRN, date of birth, ID band Patient awake    Reviewed: Allergy & Precautions, NPO status , Patient's Chart, lab work & pertinent test results  History of Anesthesia Complications (+) PONV  Airway Mallampati: III  TM Distance: >3 FB Neck ROM: Full    Dental  (+) Teeth Intact, Dental Advisory Given   Pulmonary    breath sounds clear to auscultation       Cardiovascular hypertension, Pt. on medications  Rhythm:Regular Rate:Normal     Neuro/Psych    GI/Hepatic GERD  Controlled,  Endo/Other    Renal/GU Renal disease     Musculoskeletal  (+) Arthritis ,   Abdominal   Peds  Hematology  (+) anemia ,   Anesthesia Other Findings   Reproductive/Obstetrics negative OB ROS                           Anesthesia Physical Anesthesia Plan  ASA: III  Anesthesia Plan: General   Post-op Pain Management:    Induction: Intravenous  PONV Risk Score and Plan: Dexamethasone and Ondansetron  Airway Management Planned: LMA  Additional Equipment:   Intra-op Plan:   Post-operative Plan:   Informed Consent: I have reviewed the patients History and Physical, chart, labs and discussed the procedure including the risks, benefits and alternatives for the proposed anesthesia with the patient or authorized representative who has indicated his/her understanding and acceptance.   Dental advisory given  Plan Discussed with: CRNA and Anesthesiologist  Anesthesia Plan Comments: (H/O Multiple Myeloma S/P autologous stem cell transplant on 07/24/2014, CBC OK Recurrent lambda light chain myeloma History of recurrent endometrial carcinoma   Plan GA with LMA  )        Anesthesia Quick Evaluation

## 2018-02-04 NOTE — Discharge Instructions (Signed)
Indwelling Urinary Catheter Care, Adult An indwelling urinary catheter is a thin tube that is put into your bladder. The tube helps to drain pee (urine) out of your body. The tube goes in through your urethra. Your urethra is where pee comes out of your body. Your pee will come out through the catheter, then it will go into a bag (drainage bag). Take good care of your catheter so it will work well. How to wear your catheter and bag Supplies needed  Sticky tape (adhesive tape) or a leg strap.  Alcohol wipe or soap and water (if you use tape).  A clean towel (if you use tape).  Large overnight bag.  Smaller bag (leg bag). Wearing your catheter Attach your catheter to your leg with tape or a leg strap.  Make sure the catheter is not pulled tight.  If a leg strap gets wet, take it off and put on a dry strap.  If you use tape to hold the bag on your leg: 1. Use an alcohol wipe or soap and water to wash your skin where the tape made it sticky before. 2. Use a clean towel to pat-dry that skin. 3. Use new tape to make the bag stay on your leg. Wearing your bags You should have been given a large overnight bag.  You may wear the overnight bag in the day or night.  Always have the overnight bag lower than your bladder.  Do not let the bag touch the floor.  Before you go to sleep, put a clean plastic bag in a wastebasket. Then hang the overnight bag inside the wastebasket. You should also have a smaller leg bag that fits under your clothes.  Always wear the leg bag below your knee.  Do not wear your leg bag at night. How to care for your skin and catheter Supplies needed  A clean washcloth.  Water and mild soap.  A clean towel. Caring for your skin and catheter      Clean the skin around your catheter every day: ? Wash your hands with soap and water. ? Wet a clean washcloth in warm water and mild soap. ? Clean the skin around your urethra. ? If you are female: ? Gently  spread the folds of skin around your vagina (labia). ? With the washcloth in your other hand, wipe the inner side of your labia on each side. Wipe from front to back. ? If you are female: ? Pull back any skin that covers the end of your penis (foreskin). ? With the washcloth in your other hand, wipe your penis in small circles. Start wiping at the tip of your penis, then move away from the catheter. ? With your free hand, hold the catheter close to where it goes into your body. ? Keep holding the catheter during cleaning so it does not get pulled out. ? With the washcloth in your other hand, clean the catheter. ? Only wipe downward on the catheter. ? Do not wipe upward toward your body. Doing this may push germs into your urethra and cause infection. ? Use a clean towel to pat-dry the catheter and the skin around it. Make sure to wipe off all soap. ? Wash your hands with soap and water.  Shower every day. Do not take baths.  Do not use cream, ointment, or lotion on the area where the catheter goes into your body, unless your doctor tells you to.  Do not use powders, sprays, or lotions  on your genital area.  Check your skin around the catheter every day for signs of infection. Check for: ? Redness, swelling, or pain. ? Fluid or blood. ? Warmth. ? Pus or a bad smell. How to empty the bag Supplies needed  Rubbing alcohol.  Gauze pad or cotton ball.  Tape or a leg strap. Emptying the bag Pour the pee out of your bag when it is ?- full, or at least 2-3 times a day. Do this for your overnight bag and your leg bag. 1. Wash your hands with soap and water. 2. Separate (detach) the bag from your leg. 3. Hold the bag over the toilet or a clean pail. Keep the bag lower than your hips and bladder. This is so the pee (urine) does not go back into the tube. 4. Open the pour spout. It is at the bottom of the bag. 5. Empty the pee into the toilet or pail. Do not let the pour spout touch any  surface. 6. Put rubbing alcohol on a gauze pad or cotton ball. 7. Use the gauze pad or cotton ball to clean the pour spout. 8. Close the pour spout. 9. Attach the bag to your leg with tape or a leg strap. 10. Wash your hands with soap and water. Follow instructions for cleaning the drainage bag:  From the product maker.  As told by your doctor. How to change the bag Supplies needed  Alcohol wipes.  A clean bag.  Tape or a leg strap. Changing the bag Replace your bag with a clean bag once a month. If it starts to leak, smell bad, or look dirty, change it sooner. 1. Wash your hands with soap and water. 2. Separate the dirty bag from your leg. 3. Pinch the catheter with your fingers so that pee does not spill out. 4. Separate the catheter tube from the bag tube where these tubes connect (at the connection valve). Do not let the tubes touch any surface. 5. Clean the end of the catheter tube with an alcohol wipe. Use a different alcohol wipe to clean the end of the bag tube. 6. Connect the catheter tube to the tube of the clean bag. 7. Attach the clean bag to your leg with tape or a leg strap. Do not make the bag tight on your leg. 8. Wash your hands with soap and water. General rules   Never pull on your catheter. Never try to take it out. Doing that can hurt you.  Always wash your hands before and after you touch your catheter or bag. Use a mild, fragrance-free soap. If you do not have soap and water, use hand sanitizer.  Always make sure there are no twists or bends (kinks) in the catheter tube.  Always make sure there are no leaks in the catheter or bag.  Drink enough fluid to keep your pee pale yellow.  Do not take baths, swim, or use a hot tub.  If you are female, wipe from front to back after you poop (have a bowel movement). Contact a doctor if:  Your pee is cloudy.  Your pee smells worse than usual.  Your catheter gets clogged.  Your catheter leaks.  Your  bladder feels full. Get help right away if:  You have redness, swelling, or pain where the catheter goes into your body.  You have fluid, blood, pus, or a bad smell coming from the area where the catheter goes into your body.  Your skin feels warm  where the catheter goes into your body.  You have a fever.  You have pain in your: ? Belly (abdomen). ? Legs. ? Lower back. ? Bladder.  You see blood in the catheter.  Your pee is pink or red.  You feel sick to your stomach (nauseous).  You throw up (vomit).  You have chills.  Your pee is not draining into the bag.  Your catheter gets pulled out. Summary  An indwelling urinary catheter is a thin tube that is placed into the bladder to help drain pee (urine) out of the body.  The catheter is placed into the part of the body that drains pee from the bladder (urethra).  Taking good care of your catheter will keep it working properly and help prevent problems.  Always wash your hands before and after touching your catheter or bag.  Never pull on your catheter or try to take it out. This information is not intended to replace advice given to you by your health care provider. Make sure you discuss any questions you have with your health care provider. Document Released: 05/13/2012 Document Revised: 07/09/2017 Document Reviewed: 09/01/2016 Elsevier Interactive Patient Education  2019 [facility] Instructions  Activity: Get plenty of rest for the remainder of the day. A responsible individual must stay with you for 24 hours following the procedure.  For the next 24 hours, DO NOT: -Drive a car -Paediatric nurse -Drink alcoholic beverages -Take any medication unless instructed by your physician -Make any legal decisions or sign important papers.  Meals: Start with liquid foods such as gelatin or soup. Progress to regular foods as tolerated. Avoid greasy, spicy, heavy foods. If nausea and/or  vomiting occur, drink only clear liquids until the nausea and/or vomiting subsides. Call your physician if vomiting continues.  Special Instructions/Symptoms: Your throat may feel dry or sore from the anesthesia or the breathing tube placed in your throat during surgery. If this causes discomfort, gargle with warm salt water. The discomfort should disappear within 24 hours.  Call Dr. Noland Fordyce office for an appointment on Friday to have the catheter removed.

## 2018-02-05 ENCOUNTER — Encounter (HOSPITAL_BASED_OUTPATIENT_CLINIC_OR_DEPARTMENT_OTHER): Payer: Self-pay | Admitting: Urology

## 2018-02-06 ENCOUNTER — Other Ambulatory Visit: Payer: Self-pay

## 2018-02-06 ENCOUNTER — Encounter: Payer: Self-pay | Admitting: Hematology & Oncology

## 2018-02-06 ENCOUNTER — Inpatient Hospital Stay (HOSPITAL_BASED_OUTPATIENT_CLINIC_OR_DEPARTMENT_OTHER): Payer: Medicare Other | Admitting: Hematology & Oncology

## 2018-02-06 ENCOUNTER — Inpatient Hospital Stay: Payer: Medicare Other

## 2018-02-06 ENCOUNTER — Inpatient Hospital Stay: Payer: Medicare Other | Attending: Hematology & Oncology

## 2018-02-06 VITALS — BP 151/69 | HR 88 | Temp 99.0°F | Resp 18 | Wt 194.0 lb

## 2018-02-06 DIAGNOSIS — Z79899 Other long term (current) drug therapy: Secondary | ICD-10-CM | POA: Diagnosis not present

## 2018-02-06 DIAGNOSIS — C541 Malignant neoplasm of endometrium: Secondary | ICD-10-CM

## 2018-02-06 DIAGNOSIS — I10 Essential (primary) hypertension: Secondary | ICD-10-CM | POA: Insufficient documentation

## 2018-02-06 DIAGNOSIS — R319 Hematuria, unspecified: Secondary | ICD-10-CM | POA: Insufficient documentation

## 2018-02-06 DIAGNOSIS — Z8542 Personal history of malignant neoplasm of other parts of uterus: Secondary | ICD-10-CM | POA: Insufficient documentation

## 2018-02-06 DIAGNOSIS — C9001 Multiple myeloma in remission: Secondary | ICD-10-CM

## 2018-02-06 DIAGNOSIS — D509 Iron deficiency anemia, unspecified: Secondary | ICD-10-CM | POA: Insufficient documentation

## 2018-02-06 DIAGNOSIS — Z79811 Long term (current) use of aromatase inhibitors: Secondary | ICD-10-CM | POA: Diagnosis not present

## 2018-02-06 DIAGNOSIS — Z923 Personal history of irradiation: Secondary | ICD-10-CM | POA: Diagnosis not present

## 2018-02-06 DIAGNOSIS — Z95828 Presence of other vascular implants and grafts: Secondary | ICD-10-CM

## 2018-02-06 DIAGNOSIS — C9000 Multiple myeloma not having achieved remission: Secondary | ICD-10-CM | POA: Diagnosis not present

## 2018-02-06 DIAGNOSIS — D500 Iron deficiency anemia secondary to blood loss (chronic): Secondary | ICD-10-CM

## 2018-02-06 LAB — CBC WITH DIFFERENTIAL (CANCER CENTER ONLY)
Abs Immature Granulocytes: 0.03 10*3/uL (ref 0.00–0.07)
Basophils Absolute: 0 10*3/uL (ref 0.0–0.1)
Basophils Relative: 0 %
Eosinophils Absolute: 0.1 10*3/uL (ref 0.0–0.5)
Eosinophils Relative: 1 %
HCT: 31.6 % — ABNORMAL LOW (ref 36.0–46.0)
Hemoglobin: 10.2 g/dL — ABNORMAL LOW (ref 12.0–15.0)
Immature Granulocytes: 0 %
Lymphocytes Relative: 15 %
Lymphs Abs: 1.2 10*3/uL (ref 0.7–4.0)
MCH: 27.8 pg (ref 26.0–34.0)
MCHC: 32.3 g/dL (ref 30.0–36.0)
MCV: 86.1 fL (ref 80.0–100.0)
Monocytes Absolute: 0.7 10*3/uL (ref 0.1–1.0)
Monocytes Relative: 9 %
Neutro Abs: 6.2 10*3/uL (ref 1.7–7.7)
Neutrophils Relative %: 75 %
Platelet Count: 226 10*3/uL (ref 150–400)
RBC: 3.67 MIL/uL — ABNORMAL LOW (ref 3.87–5.11)
RDW: 15.1 % (ref 11.5–15.5)
WBC Count: 8.1 10*3/uL (ref 4.0–10.5)
nRBC: 0 % (ref 0.0–0.2)

## 2018-02-06 LAB — CMP (CANCER CENTER ONLY)
ALT: 12 U/L (ref 0–44)
AST: 11 U/L — ABNORMAL LOW (ref 15–41)
Albumin: 4.3 g/dL (ref 3.5–5.0)
Alkaline Phosphatase: 38 U/L (ref 38–126)
Anion gap: 7 (ref 5–15)
BUN: 17 mg/dL (ref 8–23)
CO2: 29 mmol/L (ref 22–32)
Calcium: 9.1 mg/dL (ref 8.9–10.3)
Chloride: 106 mmol/L (ref 98–111)
Creatinine: 0.92 mg/dL (ref 0.44–1.00)
GFR, Est AFR Am: >60 mL/min (ref >60)
GFR, Estimated: >60 mL/min (ref >60)
Glucose, Bld: 103 mg/dL — ABNORMAL HIGH (ref 70–99)
Potassium: 3 mmol/L — CL (ref 3.5–5.1)
Sodium: 142 mmol/L (ref 135–145)
Total Bilirubin: 0.3 mg/dL (ref 0.3–1.2)
Total Protein: 6.4 g/dL — ABNORMAL LOW (ref 6.5–8.1)

## 2018-02-06 MED ORDER — HEPARIN SOD (PORK) LOCK FLUSH 100 UNIT/ML IV SOLN
500.0000 [IU] | Freq: Once | INTRAVENOUS | Status: AC
  Administered 2018-02-06: 500 [IU] via INTRAVENOUS
  Filled 2018-02-06: qty 5

## 2018-02-06 MED ORDER — SODIUM CHLORIDE 0.9% FLUSH
10.0000 mL | Freq: Once | INTRAVENOUS | Status: AC
  Administered 2018-02-06: 10 mL via INTRAVENOUS
  Filled 2018-02-06: qty 10

## 2018-02-06 NOTE — Progress Notes (Signed)
No treatment

## 2018-02-06 NOTE — Patient Instructions (Signed)

## 2018-02-06 NOTE — Progress Notes (Signed)
Hematology and Oncology Follow Up Visit  Emily Greer [id number] 1950-03-24 68 y.o. 02/06/2018   Principle Diagnosis:  Recurrent lambda light chain myeloma History of recurrent endometrial carcinoma Iron deficiency anemia -blood loss  PastTherapy: Status post second autologous stem cell transplant on 07/24/2014 Maintenance therapy with Pomalidomide/every 2 week Velcade - d/c'ed Xgeva 120 mg subcutaneous every 3months -next dose inNovember 2019 Radiation therapy for endometrial recurrence - completed 04/20/2015  Current Therapy:   Pomalyst/Kyprolis 70mg /m2 IV q 2 weeks - s/p cycle #12 -- held on 12/26/2017 for hematuria Femara 2.5 mg po q day  IV iron as indicated   Interim History:  Emily Greer is here today for follow-up.  The issues that we are dealing with right now is her urine.  She has a catheter into her bladder.  She was seen urology.  She had a cystoscopy done.  This was done on 02/04/2018.  Biopsies were taken.  The pathology report ([id number]) showed urothelium with dysplasia.  There is no evidence of amyloid.  I just wonder if there was any problems with respect to radiation that she had taken for the endometrial/vaginal cancer about 4 or 5 years ago.  She goes back to urology on Friday.  As far as the myeloma is concerned, we have had her off treatment for about 6 weeks.  I want her off treatment so that we could have the bladder and hematuria issue focused on.  Her lambda light chain level back in late November was 7 mg/dL.  This is up slightly from October which it was 6.4 mg/dL.  She did have a nice Thanksgiving and Christmas and New Year's.  Her mother is living with him right now.  I think they will be taken her back to [place] soon.  She has had no problems with cough.  There is been no nausea or vomiting.  There is been no leg swelling.  She has had no fever.  Overall, her performance status is ECOG 1.  Of note, she is still on Femara  and doing well with this.  Medications:  Allergies as of 02/06/2018      Reactions   Codeine Nausea Only      Medication List       Accurate as of February 06, 2018  5:03 PM. Always use your most recent med list.        albuterol 108 (90 Base) MCG/ACT inhaler Commonly known as:  PROVENTIL HFA;VENTOLIN HFA Inhale 2 puffs into the lungs every 6 (six) hours as needed for wheezing or shortness of breath. 2 puffs 3 times daily x 5 days then every 6 hours as needed.   amLODipine 10 MG tablet Commonly known as:  NORVASC TAKE 1 TABLET BY MOUTH DAILY--- takes in am   aspirin EC 81 MG tablet Take 81 mg by mouth 2 (two) times daily. Stopped  By dr Myna Hidalgo until urology procedures complete   granisetron 3.1 MG/24HR Commonly known as:  SANCUSO Apply to skin starting 24 hours before chemotherapy. Remove after 7 days.   letrozole 2.5 MG tablet Commonly known as:  FEMARA Take 1 tablet (2.5 mg total) by mouth daily.   lidocaine-prilocaine cream Commonly known as:  EMLA Apply to affected area once   loperamide 2 MG capsule Commonly known as:  IMODIUM Take by mouth as needed for diarrhea or loose stools. Reported on 08/19/2015   loratadine 10 MG tablet Commonly known as:  CLARITIN Take 10 mg by mouth every morning. Reported on  08/19/2015   NASACORT ALLERGY 24HR 55 MCG/ACT Aero nasal inhaler Generic drug:  triamcinolone Place 2 sprays into the nose as needed.   ondansetron 8 MG tablet Commonly known as:  ZOFRAN Take 1 tablet (8 mg total) by mouth 2 (two) times daily as needed (Nausea or vomiting).   pomalidomide 1 MG capsule Commonly known as:  POMALYST Take with water on days 1-21. Repeat every 28 days. AUTH [id number]   potassium chloride SA 20 MEQ tablet Commonly known as:  K-DUR,KLOR-CON TAKE 1 TABLET(20 MEQ) BY MOUTH TWICE DAILY   prochlorperazine 10 MG tablet Commonly known as:  COMPAZINE Take 1 tablet (10 mg total) by mouth every 6 (six) hours as needed (Nausea or  vomiting).   traMADol 50 MG tablet Commonly known as:  ULTRAM Take 1 tablet (50 mg total) by mouth every 6 (six) hours as needed.   triamterene-hydrochlorothiazide 37.5-25 MG tablet Commonly known as:  MAXZIDE-25 TK 1 T PO QD   Vitamin D3 50 MCG (2000 UT) Tabs Take 2 tablets by mouth daily.       Allergies:  Allergies  Allergen Reactions  . Codeine Nausea Only    Past Medical History, Surgical history, Social history, and Family History were reviewed and updated.  Review of Systems: Review of Systems  Constitutional: Negative.   HENT: Negative.   Eyes: Negative.   Respiratory: Negative.   Cardiovascular: Negative.   Gastrointestinal: Negative.   Genitourinary: Positive for hematuria.  Musculoskeletal: Negative.   Skin: Negative.   Neurological: Negative.   Endo/Heme/Allergies: Bruises/bleeds easily.  Psychiatric/Behavioral: Negative.    [name] Kitchen   Physical Exam:  weight is 194 lb (88 kg). Her oral temperature is 99 F (37.2 C). Her blood pressure is 151/69 (abnormal) and her pulse is 88. Her respiration is 18 and oxygen saturation is 100%.   Wt Readings from Last 3 Encounters:  02/06/18 194 lb (88 kg)  02/04/18 189 lb 3.2 oz (85.8 kg)  12/26/17 192 lb 12 oz (87.4 kg)    Her vital signs that were taken show temperature of 99.3.  Pulse 75.  Blood pressure 135/67.  Weight is 192 pounds.  Physical Exam Vitals signs reviewed.  HENT:     Head: Normocephalic and atraumatic.  Eyes:     Pupils: Pupils are equal, round, and reactive to light.  Neck:     Musculoskeletal: Normal range of motion.  Cardiovascular:     Rate and Rhythm: Normal rate and regular rhythm.     Heart sounds: Normal heart sounds.  Pulmonary:     Effort: Pulmonary effort is normal.     Breath sounds: Normal breath sounds.  Abdominal:     General: Bowel sounds are normal.     Palpations: Abdomen is soft.  Musculoskeletal: Normal range of motion.        General: No tenderness or deformity.   Lymphadenopathy:     Cervical: No cervical adenopathy.  Skin:    General: Skin is warm and dry.     Findings: No erythema or rash.  Neurological:     Mental Status: She is alert and oriented to person, place, and time.  Psychiatric:        Behavior: Behavior normal.        Thought Content: Thought content normal.        Judgment: Judgment normal.      Lab Results  Component Value Date   WBC 8.1 02/06/2018   HGB 10.2 (L) 02/06/2018   HCT 31.6 (L) 02/06/2018  MCV 86.1 02/06/2018   PLT 226 02/06/2018   Lab Results  Component Value Date   FERRITIN 692 (H) 12/26/2017   IRON 72 12/26/2017   TIBC 358 12/26/2017   UIBC 286 12/26/2017   IRONPCTSAT 20 (L) 12/26/2017   Lab Results  Component Value Date   RETICCTPCT 2.9 (H) 10/17/2017   RBC 3.67 (L) 02/06/2018   Lab Results  Component Value Date   KPAFRELGTCHN 6.1 12/26/2017   LAMBDASER 68.9 (H) 12/26/2017   KAPLAMBRATIO 0.09 (L) 12/26/2017   Lab Results  Component Value Date   IGGSERUM 546 (L) 12/26/2017   IGA 46 (L) 12/26/2017   IGMSERUM 11 (L) 12/26/2017   Lab Results  Component Value Date   TOTALPROTELP 6.1 12/26/2017   ALBUMINELP 3.8 12/26/2017   A1GS 0.2 12/26/2017   A2GS 0.7 12/26/2017   BETS 0.9 12/26/2017   BETA2SER 0.4 11/23/2014   GAMS 0.4 12/26/2017   MSPIKE Not Observed 12/26/2017   SPEI Comment 12/26/2017     Chemistry      Component Value Date/Time   NA 142 02/06/2018 1410   NA 141 01/10/2017 1115   NA 140 06/21/2016 0918   K 3.0 (LL) 02/06/2018 1410   K 4.0 01/10/2017 1115   K 4.3 06/21/2016 0918   CL 106 02/06/2018 1410   CL 106 01/10/2017 1115   CO2 29 02/06/2018 1410   CO2 27 01/10/2017 1115   CO2 20 (L) 06/21/2016 0918   BUN 17 02/06/2018 1410   BUN 15 01/10/2017 1115   BUN 15.8 06/21/2016 0918   CREATININE 0.92 02/06/2018 1410   CREATININE 1.0 01/10/2017 1115   CREATININE 0.8 06/21/2016 0918      Component Value Date/Time   CALCIUM 9.1 02/06/2018 1410   CALCIUM 9.5  01/10/2017 1115   CALCIUM 9.4 06/21/2016 0918   ALKPHOS 38 02/06/2018 1410   ALKPHOS 40 01/10/2017 1115   ALKPHOS 66 06/21/2016 0918   AST 11 (L) 02/06/2018 1410   AST 17 06/21/2016 0918   ALT 12 02/06/2018 1410   ALT 19 01/10/2017 1115   ALT 37 06/21/2016 0918   BILITOT 0.3 02/06/2018 1410   BILITOT 0.32 06/21/2016 0918       Impression and Plan: Ms. Gawron is a very pleasant 68 yo African American female with recurrent lambda light chain myeloma and also localized recurrent endometrial cancer (followed by gyn onc Dr. Nelly Rout).   Her second stem cell transplant for the light chain myeloma was in June 2016.   Again, the hematuria does not look like it is anything with the myeloma.  It does not look like amyloid.  We will have to see what her light chain level is.  We may have to make a change in her protocol.  We may have to go ahead and think about using monoclonal antibody therapy with either elotuzimab or daratumumab.  I would like to see her back in about 3 weeks.  I think this would be a good time to have her come back.     Josph Macho, MD 1/8/20205:03 PM

## 2018-02-06 NOTE — Addendum Note (Signed)
Addended by: Lucile Crater on: 02/06/2018 03:34 PM   Modules accepted: Orders

## 2018-02-07 LAB — FERRITIN: Ferritin: 477 ng/mL — ABNORMAL HIGH (ref 11–307)

## 2018-02-07 LAB — IRON AND TIBC
Iron: 76 ug/dL (ref 41–142)
Saturation Ratios: 23 % (ref 21–57)
TIBC: 334 ug/dL (ref 236–444)
UIBC: 257 ug/dL (ref 120–384)

## 2018-02-07 LAB — IGG, IGA, IGM
IgA: 55 mg/dL — ABNORMAL LOW (ref 87–352)
IgG (Immunoglobin G), Serum: 636 mg/dL — ABNORMAL LOW (ref 700–1600)
IgM (Immunoglobulin M), Srm: 6 mg/dL — ABNORMAL LOW (ref 26–217)

## 2018-02-07 LAB — KAPPA/LAMBDA LIGHT CHAINS
Kappa free light chain: 1.7 mg/L — ABNORMAL LOW (ref 3.3–19.4)
Kappa, lambda light chain ratio: 0.01 — ABNORMAL LOW (ref 0.26–1.65)
Lambda free light chains: 124.1 mg/L — ABNORMAL HIGH (ref 5.7–26.3)

## 2018-02-07 LAB — BETA 2 MICROGLOBULIN, SERUM: Beta-2 Microglobulin: 1.5 mg/L (ref 0.6–2.4)

## 2018-02-08 DIAGNOSIS — R310 Gross hematuria: Secondary | ICD-10-CM | POA: Diagnosis not present

## 2018-02-12 DIAGNOSIS — D509 Iron deficiency anemia, unspecified: Secondary | ICD-10-CM | POA: Diagnosis not present

## 2018-02-12 DIAGNOSIS — C541 Malignant neoplasm of endometrium: Secondary | ICD-10-CM | POA: Diagnosis not present

## 2018-02-12 DIAGNOSIS — Z79811 Long term (current) use of aromatase inhibitors: Secondary | ICD-10-CM | POA: Diagnosis not present

## 2018-02-12 DIAGNOSIS — I10 Essential (primary) hypertension: Secondary | ICD-10-CM | POA: Diagnosis not present

## 2018-02-12 DIAGNOSIS — R319 Hematuria, unspecified: Secondary | ICD-10-CM | POA: Diagnosis not present

## 2018-02-12 DIAGNOSIS — C9000 Multiple myeloma not having achieved remission: Secondary | ICD-10-CM | POA: Diagnosis not present

## 2018-02-15 LAB — UPEP/UIFE/LIGHT CHAINS/TP, 24-HR UR
% BETA, Urine: 15 %
ALPHA 1 URINE: 2.7 %
Albumin, U: 48.5 %
Alpha 2, Urine: 7.9 %
Free Kappa Lt Chains,Ur: 19.52 mg/L (ref 0.63–113.79)
Free Kappa/Lambda Ratio: 0.04 — ABNORMAL LOW (ref 1.03–31.76)
Free Lambda Lt Chains,Ur: 453.85 mg/L — ABNORMAL HIGH (ref 0.47–11.77)
GAMMA GLOBULIN URINE: 25.9 %
M-SPIKE %, Urine: 17.1 % — ABNORMAL HIGH
M-Spike, Mg/24 Hr: 39 mg/24 hr — ABNORMAL HIGH
Total Protein, Urine-Ur/day: 230 mg/24 hr — ABNORMAL HIGH (ref 30–150)
Total Protein, Urine: 27 mg/dL
Total Volume: 850

## 2018-02-18 ENCOUNTER — Encounter: Payer: Self-pay | Admitting: Nurse Practitioner

## 2018-02-18 ENCOUNTER — Ambulatory Visit (INDEPENDENT_AMBULATORY_CARE_PROVIDER_SITE_OTHER): Payer: Medicare Other | Admitting: Nurse Practitioner

## 2018-02-18 VITALS — BP 120/70 | HR 114 | Temp 100.0°F | Ht 63.0 in | Wt 186.0 lb

## 2018-02-18 DIAGNOSIS — Z760 Encounter for issue of repeat prescription: Secondary | ICD-10-CM

## 2018-02-18 DIAGNOSIS — J101 Influenza due to other identified influenza virus with other respiratory manifestations: Secondary | ICD-10-CM | POA: Diagnosis not present

## 2018-02-18 DIAGNOSIS — R6889 Other general symptoms and signs: Secondary | ICD-10-CM | POA: Insufficient documentation

## 2018-02-18 DIAGNOSIS — H6123 Impacted cerumen, bilateral: Secondary | ICD-10-CM | POA: Diagnosis not present

## 2018-02-18 LAB — POCT INFLUENZA A/B
Influenza A, POC: NEGATIVE
Influenza B, POC: POSITIVE — AB

## 2018-02-18 MED ORDER — OSELTAMIVIR PHOSPHATE 75 MG PO CAPS: 75.0000 mg | ORAL_CAPSULE | Freq: Two times a day (BID) | ORAL | 0 refills | Status: AC

## 2018-02-18 NOTE — Patient Instructions (Addendum)
HBP [place] brand medications for symptoms.    Take tylenol as needed for fever  Use debrox drops over the counter for ear wax daily when needed   Influenza, Adult Influenza is also called "the flu." It is an infection in the lungs, nose, and throat (respiratory tract). It is caused by a virus. The flu causes symptoms that are similar to symptoms of a cold. It also causes a high fever and body aches. The flu spreads easily from person to person (is contagious). Getting a flu shot (influenza vaccination) every year is the best way to prevent the flu. What are the causes? This condition is caused by the influenza virus. You can get the virus by:  Breathing in droplets that are in the air from the cough or sneeze of a person who has the virus.  Touching something that has the virus on it (is contaminated) and then touching your mouth, nose, or eyes. What increases the risk? Certain things may make you more likely to get the flu. These include:  Not washing your hands often.  Having close contact with many people during cold and flu season.  Touching your mouth, eyes, or nose without first washing your hands.  Not getting a flu shot every year. You may have a higher risk for the flu, along with serious problems such as a lung infection (pneumonia), if you:  Are older than 65.  Are pregnant.  Have a weakened disease-fighting system (immune system) because of a disease or taking certain medicines.  Have a long-term (chronic) illness, such as: ? Heart, kidney, or lung disease. ? Diabetes. ? Asthma.  Have a liver disorder.  Are very overweight (morbidly obese).  Have anemia. This is a condition that affects your red blood cells. What are the signs or symptoms? Symptoms usually begin suddenly and last 4-14 days. They may include:  Fever and chills.  Headaches, body aches, or muscle aches.  Sore throat.  Cough.  Runny or stuffy (congested) nose.  Chest  discomfort.  Not wanting to eat as much as normal (poor appetite).  Weakness or feeling tired (fatigue).  Dizziness.  Feeling sick to your stomach (nauseous) or throwing up (vomiting). How is this treated? If the flu is found early, you can be treated with medicine that can help reduce how bad the illness is and how long it lasts (antiviral medicine). This may be given by mouth (orally) or through an IV tube. Taking care of yourself at home can help your symptoms get better. Your doctor may suggest:  Taking over-the-counter medicines.  Drinking plenty of fluids. The flu often goes away on its own. If you have very bad symptoms or other problems, you may be treated in a hospital. Follow these instructions at home:     Activity  Rest as needed. Get plenty of sleep.  Stay home from work or school as told by your doctor. ? Do not leave home until you do not have a fever for 24 hours without taking medicine. ? Leave home only to visit your doctor. Eating and drinking  Take an ORS (oral rehydration solution). This is a drink that is sold at pharmacies and stores.  Drink enough fluid to keep your pee (urine) pale yellow.  Drink clear fluids in small amounts as you are able. Clear fluids include: ? Water. ? Ice chips. ? Fruit juice that has water added (diluted fruit juice). ? Low-calorie sports drinks.  Eat bland, easy-to-digest foods in small amounts as  you are able. These foods include: ? Bananas. ? Applesauce. ? Rice. ? Lean meats. ? Toast. ? Crackers.  Do not eat or drink: ? Fluids that have a lot of sugar or caffeine. ? Alcohol. ? Spicy or fatty foods. General instructions  Take over-the-counter and prescription medicines only as told by your doctor.  Use a cool mist humidifier to add moisture to the air in your home. This can make it easier for you to breathe.  Cover your mouth and nose when you cough or sneeze.  Wash your hands with soap and water often,  especially after you cough or sneeze. If you cannot use soap and water, use alcohol-based hand sanitizer.  Keep all follow-up visits as told by your doctor. This is important. How is this prevented?   Get a flu shot every year. You may get the flu shot in late summer, fall, or winter. Ask your doctor when you should get your flu shot.  Avoid contact with people who are sick during fall and winter (cold and flu season). Contact a doctor if:  You get new symptoms.  You have: ? Chest pain. ? Watery poop (diarrhea). ? A fever.  Your cough gets worse.  You start to have more mucus.  You feel sick to your stomach.  You throw up. Get help right away if you:  Have shortness of breath.  Have trouble breathing.  Have skin or nails that turn a bluish color.  Have very bad pain or stiffness in your neck.  Get a sudden headache.  Get sudden pain in your face or ear.  Cannot eat or drink without throwing up. Summary  Influenza ("the flu") is an infection in the lungs, nose, and throat. It is caused by a virus.  Take over-the-counter and prescription medicines only as told by your doctor.  Getting a flu shot every year is the best way to avoid getting the flu. This information is not intended to replace advice given to you by your health care provider. Make sure you discuss any questions you have with your health care provider. Document Released: 10/26/2007 Document Revised: 07/04/2017 Document Reviewed: 07/04/2017 Elsevier Interactive Patient Education  2019 [employer].

## 2018-02-18 NOTE — Progress Notes (Signed)
Subjective:     Patient ID: Emily Greer , female    DOB: 1950-06-28 , 68 y.o.   MRN: [id number]   Chief Complaint  Patient presents with  . Fever    since friday  . Chills  . Generalized Body Aches  . Medication Refill    [facility]    HPI  Fever   This is a new problem. The current episode started in the past 7 days. The problem occurs constantly. The maximum temperature noted was 101 to 101.9 F. The temperature was taken using an oral thermometer. Pertinent negatives include no abdominal pain, chest pain or congestion.  Medication Refill  Associated symptoms include a fever. Pertinent negatives include no abdominal pain, chest pain or congestion.     Past Medical History:  Diagnosis Date  . Avascular necrosis of femoral head (HCC)    bilateral per CT 07-26-2015  . Endometrial carcinoma [facility]) gyn oncologist-  dr Nelly Rout ([facility])/  radiation oncologist-- dr Roselind Messier   2013 dx  FIGO Stage 1A, Grade 2 endometrioid endometrial cancer s/p TAH w/ BSO and bilateral pelvic node dissection 10-31-2011 ;  recurrence at distal vagina 04/ 2014 s/p  brachytherapy (ended 07-29-2012);  2nd recurrence 12/ 2016  vaginal apex s/p  conformational radiotherapy 03-10-2015 to 04-20-2015  . Family history of adverse reaction to anesthesia    mother ponv  . GERD (gastroesophageal reflux disease)   . H/O stem cell transplant (HCC)    02/ 2000 and second one 06/ 2016  . 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 History of chemotherapy    last chemo 12-26-17  . History of radiation therapy 6/4, 6/11, 6/19, 6/25, 07/29/2012   vagina 30.5 gray in 5 fx, HDR brachytherapy:   last radiation to vagina 03-10-2015 to 04-20-2015  50.4gray  . History of radiation therapy 03/10/15-04/20/15   vagina 50.4 in 28 fractions  . Hypertension   . Lambda light chain myeloma [facility]) oncologist-  dr Myna Hidalgo ([facility])  and  [facility] -- dr Silvestre Mesi gasparetto   dx  07/ 1999 s/p  VAD chemotherapy 11/ 1999,  purged autotransplant 03-21-1998 followed by auto stem cell transplant 03-29-1998;  recurrance w/ second autologous stem cell transplant 07-24-2014;  in Re-mission currently , chemo maintenance therapy  . Osteoporosis 12/18/05   Increased  risk   . PONV (postoperative nausea and vomiting)   . Wears glasses      Family History  Problem Relation Age of Onset  . Colon cancer Mother   . Hypertension Father   . Heart Problems Father      Current Outpatient Medications:  .  albuterol (PROVENTIL HFA;VENTOLIN HFA) 108 (90 BASE) MCG/ACT inhaler, Inhale 2 puffs into the lungs every 6 (six) hours as needed for wheezing or shortness of breath. 2 puffs 3 times daily x 5 days then every 6 hours as needed., Disp: 1 Inhaler, Rfl: 0 .  amLODipine (NORVASC) 10 MG tablet, TAKE 1 TABLET BY MOUTH DAILY--- takes in am, Disp: , Rfl:  .  Cholecalciferol (VITAMIN D3) 2000 UNITS TABS, Take 2 tablets by mouth daily. , Disp: , Rfl:  .  granisetron (SANCUSO) 3.1 MG/24HR, Apply to skin starting 24 hours before chemotherapy. Remove after 7 days., Disp: 3 each, Rfl: 3 .  letrozole (FEMARA) 2.5 MG tablet, Take 1 tablet (2.5 mg total) by mouth daily., Disp: 60 tablet, Rfl: 4 .  lidocaine-prilocaine (EMLA) cream, Apply to affected area once,  Disp: 30 g, Rfl: 3 .  loperamide (IMODIUM) 2 MG capsule, Take by mouth as needed for diarrhea or loose stools. Reported on 08/19/2015, Disp: , Rfl:  .  loratadine (CLARITIN) 10 MG tablet, Take 10 mg by mouth every morning. Reported on 08/19/2015, Disp: , Rfl:  .  ondansetron (ZOFRAN) 8 MG tablet, Take 1 tablet (8 mg total) by mouth 2 (two) times daily as needed (Nausea or vomiting)., Disp: 30 tablet, Rfl: 1 .  potassium chloride SA (K-DUR,KLOR-CON) 20 MEQ tablet, TAKE 1 TABLET(20 MEQ) BY MOUTH TWICE DAILY, Disp: 60 tablet, Rfl: 0 .  prochlorperazine (COMPAZINE) 10 MG tablet, Take 1 tablet (10 mg total) by mouth every 6 (six) hours as needed (Nausea  or vomiting)., Disp: 30 tablet, Rfl: 1 .  traMADol (ULTRAM) 50 MG tablet, Take 1 tablet (50 mg total) by mouth every 6 (six) hours as needed., Disp: 30 tablet, Rfl: 0 .  triamcinolone (NASACORT ALLERGY 24HR) 55 MCG/ACT AERO nasal inhaler, Place 2 sprays into the nose as needed. , Disp: , Rfl:  .  triamterene-hydrochlorothiazide (MAXZIDE-25) 37.5-25 MG tablet, TK 1 T PO QD, Disp: , Rfl: 3 .  aspirin EC 81 MG tablet, Take 81 mg by mouth 2 (two) times daily. Stopped  By dr Myna Hidalgo until urology procedures complete, Disp: , Rfl:  .  pomalidomide (POMALYST) 1 MG capsule, Take with water on days 1-21. Repeat every 28 days. AUTH [id number] (Patient not taking: Reported on 01/31/2018), Disp: 21 capsule, Rfl: 0 No current facility-administered medications for this visit.   Facility-Administered Medications Ordered in Other Visits:  .  sodium chloride flush (NS) 0.9 % injection 10 mL, 10 mL, Intravenous, PRN, [place], Sarah M, NP, 10 mL at 12/26/17 1130   Allergies  Allergen Reactions  . Codeine Nausea Only     Review of Systems  Constitutional: Positive for fever.  HENT: Negative for congestion.   Cardiovascular: Negative for chest pain.  Gastrointestinal: Negative for abdominal pain.     Today's Vitals   02/18/18 1603  BP: 120/70  Pulse: (!) 114  Temp: 100 F (37.8 C)  TempSrc: Oral  SpO2: 93%   There is no height or weight on file to calculate BMI.   Objective:  Physical Exam Vitals signs reviewed.  Constitutional:      Appearance: Normal appearance.  HENT:     Head: Normocephalic and atraumatic.     Right Ear: Tympanic membrane, ear canal and external ear normal.     Left Ear: Tympanic membrane, ear canal and external ear normal.     Nose: Nose normal. No congestion.     Mouth/Throat:     Mouth: Mucous membranes are moist.  Eyes:     Extraocular Movements: Extraocular movements intact.     Conjunctiva/sclera: Conjunctivae normal.     Pupils: Pupils are equal, round, and  reactive to light.  Neck:     Musculoskeletal: Normal range of motion and neck supple. No muscular tenderness.  Cardiovascular:     Rate and Rhythm: Regular rhythm. Tachycardia present.  Neurological:     Mental Status: She is alert.         Assessment And Plan:     1. Flu-like symptoms  She is positive for Influenza B will treat with tamiflu.  She is interested in the Shingles vaccine advised she needs to wait until she is healthy without virus or cold symptoms. - POCT Influenza A/B  2. Influenza B  Advised her to treat symptomatically with HBP Coricidan  Tylenol as needed for fever and body aches. - oseltamivir (TAMIFLU) 75 MG capsule; Take 1 capsule (75 mg total) by mouth 2 (two) times daily for 5 days.  Dispense: 10 capsule; Refill: 0  3. Excessive cerumen in both ear canals  Advised to use debrox drops to ears daily as needed  I did not irrigate due to not feeling well.       Arnette Felts, FNP

## 2018-02-21 ENCOUNTER — Telehealth: Payer: Self-pay

## 2018-02-21 DIAGNOSIS — C672 Malignant neoplasm of lateral wall of bladder: Secondary | ICD-10-CM | POA: Diagnosis not present

## 2018-02-21 MED ORDER — ALBUTEROL SULFATE HFA 108 (90 BASE) MCG/ACT IN AERS: 2.0000 | INHALATION_SPRAY | Freq: Four times a day (QID) | RESPIRATORY_TRACT | 3 refills | Status: DC | PRN

## 2018-02-21 NOTE — Telephone Encounter (Signed)
Error

## 2018-02-24 ENCOUNTER — Other Ambulatory Visit: Payer: Self-pay | Admitting: Family

## 2018-02-24 ENCOUNTER — Other Ambulatory Visit: Payer: Self-pay | Admitting: Hematology & Oncology

## 2018-02-24 DIAGNOSIS — C9000 Multiple myeloma not having achieved remission: Secondary | ICD-10-CM

## 2018-02-24 DIAGNOSIS — C541 Malignant neoplasm of endometrium: Secondary | ICD-10-CM

## 2018-02-26 ENCOUNTER — Other Ambulatory Visit: Payer: Self-pay | Admitting: *Deleted

## 2018-02-26 DIAGNOSIS — C9000 Multiple myeloma not having achieved remission: Secondary | ICD-10-CM

## 2018-02-26 DIAGNOSIS — C541 Malignant neoplasm of endometrium: Secondary | ICD-10-CM

## 2018-02-26 MED ORDER — LIDOCAINE-PRILOCAINE 2.5-2.5 % EX CREA: TOPICAL_CREAM | CUTANEOUS | 3 refills | Status: DC

## 2018-02-26 MED ORDER — GRANISETRON 3.1 MG/24HR TD PTCH: MEDICATED_PATCH | TRANSDERMAL | 4 refills | Status: DC

## 2018-02-27 ENCOUNTER — Other Ambulatory Visit: Payer: Self-pay | Admitting: *Deleted

## 2018-02-27 ENCOUNTER — Inpatient Hospital Stay: Payer: Medicare Other

## 2018-02-27 ENCOUNTER — Inpatient Hospital Stay (HOSPITAL_BASED_OUTPATIENT_CLINIC_OR_DEPARTMENT_OTHER): Payer: Medicare Other | Admitting: Hematology & Oncology

## 2018-02-27 VITALS — BP 132/58 | HR 81 | Temp 98.2°F | Resp 18 | Ht 63.0 in | Wt 186.8 lb

## 2018-02-27 DIAGNOSIS — Z8542 Personal history of malignant neoplasm of other parts of uterus: Secondary | ICD-10-CM

## 2018-02-27 DIAGNOSIS — C9001 Multiple myeloma in remission: Secondary | ICD-10-CM

## 2018-02-27 DIAGNOSIS — C9000 Multiple myeloma not having achieved remission: Secondary | ICD-10-CM

## 2018-02-27 DIAGNOSIS — I10 Essential (primary) hypertension: Secondary | ICD-10-CM | POA: Diagnosis not present

## 2018-02-27 DIAGNOSIS — D509 Iron deficiency anemia, unspecified: Secondary | ICD-10-CM

## 2018-02-27 DIAGNOSIS — C541 Malignant neoplasm of endometrium: Secondary | ICD-10-CM | POA: Diagnosis not present

## 2018-02-27 DIAGNOSIS — Z923 Personal history of irradiation: Secondary | ICD-10-CM

## 2018-02-27 DIAGNOSIS — Z79811 Long term (current) use of aromatase inhibitors: Secondary | ICD-10-CM

## 2018-02-27 DIAGNOSIS — R319 Hematuria, unspecified: Secondary | ICD-10-CM | POA: Diagnosis not present

## 2018-02-27 DIAGNOSIS — Z95828 Presence of other vascular implants and grafts: Secondary | ICD-10-CM

## 2018-02-27 DIAGNOSIS — D500 Iron deficiency anemia secondary to blood loss (chronic): Secondary | ICD-10-CM

## 2018-02-27 DIAGNOSIS — Z79899 Other long term (current) drug therapy: Secondary | ICD-10-CM | POA: Diagnosis not present

## 2018-02-27 LAB — CMP (CANCER CENTER ONLY)
ALT: 11 U/L (ref 0–44)
AST: 9 U/L — ABNORMAL LOW (ref 15–41)
Albumin: 4.3 g/dL (ref 3.5–5.0)
Alkaline Phosphatase: 37 U/L — ABNORMAL LOW (ref 38–126)
Anion gap: 9 (ref 5–15)
BUN: 20 mg/dL (ref 8–23)
CO2: 26 mmol/L (ref 22–32)
Calcium: 9.6 mg/dL (ref 8.9–10.3)
Chloride: 106 mmol/L (ref 98–111)
Creatinine: 1.37 mg/dL — ABNORMAL HIGH (ref 0.44–1.00)
GFR, Est AFR Am: 47 mL/min — ABNORMAL LOW (ref >60)
GFR, Estimated: 40 mL/min — ABNORMAL LOW (ref >60)
Glucose, Bld: 104 mg/dL — ABNORMAL HIGH (ref 70–99)
Potassium: 3.3 mmol/L — ABNORMAL LOW (ref 3.5–5.1)
Sodium: 141 mmol/L (ref 135–145)
Total Bilirubin: 0.3 mg/dL (ref 0.3–1.2)
Total Protein: 6.3 g/dL — ABNORMAL LOW (ref 6.5–8.1)

## 2018-02-27 LAB — CBC WITH DIFFERENTIAL (CANCER CENTER ONLY)
Abs Immature Granulocytes: 0.02 10*3/uL (ref 0.00–0.07)
Basophils Absolute: 0 10*3/uL (ref 0.0–0.1)
Basophils Relative: 1 %
Eosinophils Absolute: 0 10*3/uL (ref 0.0–0.5)
Eosinophils Relative: 1 %
HCT: 30.3 % — ABNORMAL LOW (ref 36.0–46.0)
Hemoglobin: 9.8 g/dL — ABNORMAL LOW (ref 12.0–15.0)
Immature Granulocytes: 1 %
Lymphocytes Relative: 21 %
Lymphs Abs: 0.7 10*3/uL (ref 0.7–4.0)
MCH: 27.2 pg (ref 26.0–34.0)
MCHC: 32.3 g/dL (ref 30.0–36.0)
MCV: 84.2 fL (ref 80.0–100.0)
Monocytes Absolute: 0.4 10*3/uL (ref 0.1–1.0)
Monocytes Relative: 12 %
Neutro Abs: 2.1 10*3/uL (ref 1.7–7.7)
Neutrophils Relative %: 64 %
Platelet Count: 254 10*3/uL (ref 150–400)
RBC: 3.6 MIL/uL — ABNORMAL LOW (ref 3.87–5.11)
RDW: 14.6 % (ref 11.5–15.5)
WBC Count: 3.3 10*3/uL — ABNORMAL LOW (ref 4.0–10.5)
nRBC: 0 % (ref 0.0–0.2)

## 2018-02-27 LAB — LACTATE DEHYDROGENASE: LDH: 157 U/L (ref 98–192)

## 2018-02-27 MED ORDER — SODIUM CHLORIDE 0.9% FLUSH
10.0000 mL | INTRAVENOUS | Status: DC | PRN
  Filled 2018-02-27: qty 10

## 2018-02-27 MED ORDER — HEPARIN SOD (PORK) LOCK FLUSH 100 UNIT/ML IV SOLN
500.0000 [IU] | Freq: Once | INTRAVENOUS | Status: DC
  Filled 2018-02-27: qty 5

## 2018-02-27 MED ORDER — SODIUM CHLORIDE 0.9% FLUSH
10.0000 mL | Freq: Once | INTRAVENOUS | Status: AC
  Administered 2018-02-27: 10 mL via INTRAVENOUS
  Filled 2018-02-27: qty 10

## 2018-02-27 MED ORDER — POMALIDOMIDE 1 MG PO CAPS: ORAL_CAPSULE | ORAL | 0 refills | Status: DC

## 2018-02-27 MED ORDER — MONTELUKAST SODIUM 10 MG PO TABS: 10.0000 mg | ORAL_TABLET | Freq: Every day | ORAL | 1 refills | Status: DC

## 2018-02-27 NOTE — Patient Instructions (Signed)

## 2018-02-27 NOTE — Progress Notes (Signed)
Hematology and Oncology Follow Up Visit  LECIA AMANTE [id number] 05/03/1950 68 y.o. 02/27/2018   Principle Diagnosis:  Recurrent lambda light chain myeloma History of recurrent endometrial carcinoma Iron deficiency anemia -blood loss  PastTherapy: Status post second autologous stem cell transplant on 07/24/2014 Maintenance therapy with Pomalidomide/every 2 week Velcade - d/c'ed Xgeva 120 mg subcutaneous every 3months -next dose inNovember 2019 Radiation therapy for endometrial recurrence - completed 04/20/2015  Current Therapy:   Pomalyst/Kyprolis 70mg /m2 IV q 2 weeks - s/p cycle #12 -- held on 12/26/2017 for hematuria Femara 2.5 mg po q day  IV iron as indicated Daratumumab/Pomalyst/Decadron -- start on 03/06/2018   Interim History:  Ms. Koroma is here today for follow-up.  Unfortunately, I think we now definitely are looking at progressive myeloma.  I know that she has been off treatment.  Her urinary issues seem to be doing better.  It does not sound like the urologist wants to see her back for a couple months.  He did a cystoscopy on her.  This was done on 02/04/2018.  The pathology report shows dysplasia.  I suppose this might be from radiation therapy that she had for the uterine cancer.  There did not appear to be any amyloid.  As far as her myeloma studies are concerned with her light chain disease, her lambda light chain was 12.4 mg/dL.  In November it was 6.9 mg/dL.  Again, I suspect that she is progressing.  We will certainly utilize monoclonal antibody therapy now.  I think that daratumumab with pomalidomide would be a good combination for her.  I talked her about daratumumab.  We did get her blood set up for typing today prior to her getting daratumumab.  I sent in a prescription for Singulair (10 mg p.o. daily) that she will start to take about 4 days prior to the daratumumab.  She is worried about her mom.  Her mother is 73 years old.  Her mother  recently was found to have a pulmonary embolism.  Ms. Galen did a great job in getting her to the emergency room to be evaluated for what she thought was pneumonia.  Ms. Maione has had no fever.  She has had no recurrent hematuria.  Overall, her performance status is ECOG 1.    Medications:  Allergies as of 02/27/2018      Reactions   Codeine Nausea Only      Medication List       Accurate as of February 27, 2018  1:04 PM. Always use your most recent med list.        albuterol 108 (90 Base) MCG/ACT inhaler Commonly known as:  PROVENTIL HFA;VENTOLIN HFA Inhale 2 puffs into the lungs every 6 (six) hours as needed for wheezing or shortness of breath. 2 puffs 3 times daily x 5 days then every 6 hours as needed.   amLODipine 10 MG tablet Commonly known as:  NORVASC TAKE 1 TABLET BY MOUTH DAILY--- takes in am   aspirin EC 81 MG tablet Take 81 mg by mouth 2 (two) times daily. Stopped  By dr Myna Hidalgo until urology procedures complete   granisetron 3.1 MG/24HR Commonly known as:  SANCUSO APPLY TO SKIN STARTING 24 HOURS BEFORE CHEMO REMOVE AFTER 7 DAYS   letrozole 2.5 MG tablet Commonly known as:  FEMARA Take 1 tablet (2.5 mg total) by mouth daily.   lidocaine-prilocaine cream Commonly known as:  EMLA APPLY EXTERNALLY TO THE AFFECTED AREA 1 TIME   loperamide 2 MG  capsule Commonly known as:  IMODIUM Take by mouth as needed for diarrhea or loose stools. Reported on 08/19/2015   loratadine 10 MG tablet Commonly known as:  CLARITIN Take 10 mg by mouth every morning. Reported on 08/19/2015   NASACORT ALLERGY 24HR 55 MCG/ACT Aero nasal inhaler Generic drug:  triamcinolone Place 2 sprays into the nose as needed.   ondansetron 8 MG tablet Commonly known as:  ZOFRAN Take 1 tablet (8 mg total) by mouth 2 (two) times daily as needed (Nausea or vomiting).   pomalidomide 1 MG capsule Commonly known as:  POMALYST Take with water on days 1-21. Repeat every 28 days. AUTH [id number]     potassium chloride SA 20 MEQ tablet Commonly known as:  K-DUR,KLOR-CON TAKE 1 TABLET(20 MEQ) BY MOUTH TWICE DAILY   prochlorperazine 10 MG tablet Commonly known as:  COMPAZINE Take 1 tablet (10 mg total) by mouth every 6 (six) hours as needed (Nausea or vomiting).   traMADol 50 MG tablet Commonly known as:  ULTRAM Take 1 tablet (50 mg total) by mouth every 6 (six) hours as needed.   triamterene-hydrochlorothiazide 37.5-25 MG tablet Commonly known as:  MAXZIDE-25 TK 1 T PO QD   Vitamin D3 50 MCG (2000 UT) Tabs Take 2 tablets by mouth daily.       Allergies:  Allergies  Allergen Reactions  . Codeine Nausea Only    Past Medical History, Surgical history, Social history, and Family History were reviewed and updated.  Review of Systems: Review of Systems  Constitutional: Negative.   HENT: Negative.   Eyes: Negative.   Respiratory: Negative.   Cardiovascular: Negative.   Gastrointestinal: Negative.   Genitourinary: Positive for hematuria.  Musculoskeletal: Negative.   Skin: Negative.   Neurological: Negative.   Endo/Heme/Allergies: Bruises/bleeds easily.  Psychiatric/Behavioral: Negative.    [name] Kitchen   Physical Exam:  vitals were not taken for this visit.   Wt Readings from Last 3 Encounters:  02/27/18 186 lb 12.8 oz (84.7 kg)  02/18/18 186 lb (84.4 kg)  02/06/18 194 lb (88 kg)    Her vital signs that were taken show temperature of 99.3.  Pulse 75.  Blood pressure 135/67.  Weight is 192 pounds.  Physical Exam Vitals signs reviewed.  HENT:     Head: Normocephalic and atraumatic.  Eyes:     Pupils: Pupils are equal, round, and reactive to light.  Neck:     Musculoskeletal: Normal range of motion.  Cardiovascular:     Rate and Rhythm: Normal rate and regular rhythm.     Heart sounds: Normal heart sounds.  Pulmonary:     Effort: Pulmonary effort is normal.     Breath sounds: Normal breath sounds.  Abdominal:     General: Bowel sounds are normal.      Palpations: Abdomen is soft.  Musculoskeletal: Normal range of motion.        General: No tenderness or deformity.  Lymphadenopathy:     Cervical: No cervical adenopathy.  Skin:    General: Skin is warm and dry.     Findings: No erythema or rash.  Neurological:     Mental Status: She is alert and oriented to person, place, and time.  Psychiatric:        Behavior: Behavior normal.        Thought Content: Thought content normal.        Judgment: Judgment normal.      Lab Results  Component Value Date   WBC 8.1 02/06/2018  HGB 10.2 (L) 02/06/2018   HCT 31.6 (L) 02/06/2018   MCV 86.1 02/06/2018   PLT 226 02/06/2018   Lab Results  Component Value Date   FERRITIN 477 (H) 02/06/2018   IRON 76 02/06/2018   TIBC 334 02/06/2018   UIBC 257 02/06/2018   IRONPCTSAT 23 02/06/2018   Lab Results  Component Value Date   RETICCTPCT 2.9 (H) 10/17/2017   RBC 3.67 (L) 02/06/2018   Lab Results  Component Value Date   KPAFRELGTCHN 1.7 (L) 02/06/2018   LAMBDASER 124.1 (H) 02/06/2018   KAPLAMBRATIO 0.04 (L) 02/12/2018   Lab Results  Component Value Date   IGGSERUM 636 (L) 02/06/2018   IGA 55 (L) 02/06/2018   IGMSERUM 6 (L) 02/06/2018   Lab Results  Component Value Date   TOTALPROTELP 6.1 12/26/2017   ALBUMINELP 3.8 12/26/2017   A1GS 0.2 12/26/2017   A2GS 0.7 12/26/2017   BETS 0.9 12/26/2017   BETA2SER 0.4 11/23/2014   GAMS 0.4 12/26/2017   MSPIKE Not Observed 12/26/2017   SPEI Comment 12/26/2017     Chemistry      Component Value Date/Time   NA 142 02/06/2018 1410   NA 141 01/10/2017 1115   NA 140 06/21/2016 0918   K 3.0 (LL) 02/06/2018 1410   K 4.0 01/10/2017 1115   K 4.3 06/21/2016 0918   CL 106 02/06/2018 1410   CL 106 01/10/2017 1115   CO2 29 02/06/2018 1410   CO2 27 01/10/2017 1115   CO2 20 (L) 06/21/2016 0918   BUN 17 02/06/2018 1410   BUN 15 01/10/2017 1115   BUN 15.8 06/21/2016 0918   CREATININE 0.92 02/06/2018 1410   CREATININE 1.0 01/10/2017 1115    CREATININE 0.8 06/21/2016 0918      Component Value Date/Time   CALCIUM 9.1 02/06/2018 1410   CALCIUM 9.5 01/10/2017 1115   CALCIUM 9.4 06/21/2016 0918   ALKPHOS 38 02/06/2018 1410   ALKPHOS 40 01/10/2017 1115   ALKPHOS 66 06/21/2016 0918   AST 11 (L) 02/06/2018 1410   AST 17 06/21/2016 0918   ALT 12 02/06/2018 1410   ALT 19 01/10/2017 1115   ALT 37 06/21/2016 0918   BILITOT 0.3 02/06/2018 1410   BILITOT 0.32 06/21/2016 0918       Impression and Plan: Ms. Vandersteen is a very pleasant 68 yo African American female with recurrent lambda light chain myeloma and also localized recurrent endometrial cancer (followed by gyn onc Dr. Nelly Rout).   Her second stem cell transplant for the light chain myeloma was in June 2016.   We will go ahead and get her set up with treatment to start next week.   I will plan to see her back in about 3 weeks or so.  I do expect that she will respond to the daratumumab/pomalidomide combination.      Josph Macho, MD 1/29/20201:04 PM

## 2018-02-27 NOTE — Addendum Note (Signed)
Addended by: Melton Krebs on: 02/27/2018 02:09 PM   Modules accepted: Orders, SmartSet

## 2018-02-27 NOTE — Progress Notes (Signed)
START ON PATHWAY REGIMEN - Multiple Myeloma and Other Plasma Cell Dyscrasias     Cycles 1 and 2: A cycle is every 28 days:     Pomalidomide      Dexamethasone      Daratumumab    Cycles 3 through 6: A cycle is every 28 days:     Pomalidomide      Dexamethasone      Dexamethasone      Daratumumab    Cycles 7 and beyond: A cycle is every 28 days:     Pomalidomide      Dexamethasone      Dexamethasone      Daratumumab   **Always confirm dose/schedule in your pharmacy ordering system**  Patient Characteristics: Relapsed / Refractory, Second through Fourth Lines of Therapy R-ISS Staging: III Disease Classification: Refractory Line of Therapy: Third Line Intent of Therapy: Non-Curative / Palliative Intent, Discussed with Patient

## 2018-02-28 LAB — IRON AND TIBC
Iron: 60 ug/dL (ref 41–142)
Saturation Ratios: 20 % — ABNORMAL LOW (ref 21–57)
TIBC: 300 ug/dL (ref 236–444)
UIBC: 240 ug/dL (ref 120–384)

## 2018-02-28 LAB — PROTEIN ELECTROPHORESIS, SERUM, WITH REFLEX
A/G Ratio: 1.6 (ref 0.7–1.7)
Albumin ELP: 3.7 g/dL (ref 2.9–4.4)
Alpha-1-Globulin: 0.2 g/dL (ref 0.0–0.4)
Alpha-2-Globulin: 0.9 g/dL (ref 0.4–1.0)
Beta Globulin: 0.9 g/dL (ref 0.7–1.3)
Gamma Globulin: 0.4 g/dL (ref 0.4–1.8)
Globulin, Total: 2.3 g/dL (ref 2.2–3.9)
Total Protein ELP: 6 g/dL (ref 6.0–8.5)

## 2018-02-28 LAB — FERRITIN: Ferritin: 621 ng/mL — ABNORMAL HIGH (ref 11–307)

## 2018-02-28 LAB — IGG, IGA, IGM
IgA: 48 mg/dL — ABNORMAL LOW (ref 87–352)
IgG (Immunoglobin G), Serum: 592 mg/dL — ABNORMAL LOW (ref 700–1600)
IgM (Immunoglobulin M), Srm: 8 mg/dL — ABNORMAL LOW (ref 26–217)

## 2018-02-28 LAB — KAPPA/LAMBDA LIGHT CHAINS
Kappa free light chain: 5.7 mg/L (ref 3.3–19.4)
Kappa, lambda light chain ratio: 0.04 — ABNORMAL LOW (ref 0.26–1.65)
Lambda free light chains: 161 mg/L — ABNORMAL HIGH (ref 5.7–26.3)

## 2018-02-28 MED ORDER — POMALIDOMIDE 3 MG PO CAPS: ORAL_CAPSULE | ORAL | 4 refills | Status: DC

## 2018-03-04 ENCOUNTER — Ambulatory Visit: Payer: Medicare Other | Admitting: Internal Medicine

## 2018-03-05 LAB — IFE, DARA-SPECIFIC, SERUM
IgA: 48 mg/dL — ABNORMAL LOW (ref 87–352)
IgG (Immunoglobin G), Serum: 593 mg/dL — ABNORMAL LOW (ref 700–1600)
IgM (Immunoglobulin M), Srm: 7 mg/dL — ABNORMAL LOW (ref 26–217)

## 2018-03-06 ENCOUNTER — Other Ambulatory Visit: Payer: Self-pay

## 2018-03-06 DIAGNOSIS — C9001 Multiple myeloma in remission: Secondary | ICD-10-CM

## 2018-03-07 ENCOUNTER — Inpatient Hospital Stay: Payer: Medicare Other

## 2018-03-07 ENCOUNTER — Inpatient Hospital Stay: Payer: Medicare Other | Attending: Hematology & Oncology

## 2018-03-07 ENCOUNTER — Other Ambulatory Visit: Payer: Self-pay | Admitting: *Deleted

## 2018-03-07 ENCOUNTER — Other Ambulatory Visit: Payer: Self-pay

## 2018-03-07 VITALS — BP 145/70 | HR 69 | Temp 99.1°F | Resp 20

## 2018-03-07 DIAGNOSIS — Z79899 Other long term (current) drug therapy: Secondary | ICD-10-CM | POA: Insufficient documentation

## 2018-03-07 DIAGNOSIS — I10 Essential (primary) hypertension: Secondary | ICD-10-CM | POA: Insufficient documentation

## 2018-03-07 DIAGNOSIS — Z923 Personal history of irradiation: Secondary | ICD-10-CM | POA: Diagnosis not present

## 2018-03-07 DIAGNOSIS — C9001 Multiple myeloma in remission: Secondary | ICD-10-CM

## 2018-03-07 DIAGNOSIS — Z79811 Long term (current) use of aromatase inhibitors: Secondary | ICD-10-CM | POA: Diagnosis not present

## 2018-03-07 DIAGNOSIS — Z5112 Encounter for antineoplastic immunotherapy: Secondary | ICD-10-CM | POA: Insufficient documentation

## 2018-03-07 DIAGNOSIS — D509 Iron deficiency anemia, unspecified: Secondary | ICD-10-CM | POA: Diagnosis not present

## 2018-03-07 DIAGNOSIS — C541 Malignant neoplasm of endometrium: Secondary | ICD-10-CM | POA: Insufficient documentation

## 2018-03-07 DIAGNOSIS — C9000 Multiple myeloma not having achieved remission: Secondary | ICD-10-CM | POA: Diagnosis not present

## 2018-03-07 DIAGNOSIS — R319 Hematuria, unspecified: Secondary | ICD-10-CM | POA: Diagnosis not present

## 2018-03-07 DIAGNOSIS — Z95828 Presence of other vascular implants and grafts: Secondary | ICD-10-CM

## 2018-03-07 DIAGNOSIS — Z7982 Long term (current) use of aspirin: Secondary | ICD-10-CM | POA: Diagnosis not present

## 2018-03-07 LAB — CMP (CANCER CENTER ONLY)
ALT: 13 U/L (ref 0–44)
AST: 10 U/L — ABNORMAL LOW (ref 15–41)
Albumin: 4.3 g/dL (ref 3.5–5.0)
Alkaline Phosphatase: 35 U/L — ABNORMAL LOW (ref 38–126)
Anion gap: 7 (ref 5–15)
BUN: 15 mg/dL (ref 8–23)
CO2: 27 mmol/L (ref 22–32)
Calcium: 9.7 mg/dL (ref 8.9–10.3)
Chloride: 106 mmol/L (ref 98–111)
Creatinine: 0.85 mg/dL (ref 0.44–1.00)
GFR, Est AFR Am: >60 mL/min (ref >60)
GFR, Estimated: >60 mL/min (ref >60)
Glucose, Bld: 100 mg/dL — ABNORMAL HIGH (ref 70–99)
Potassium: 3.8 mmol/L (ref 3.5–5.1)
Sodium: 140 mmol/L (ref 135–145)
Total Bilirubin: 0.4 mg/dL (ref 0.3–1.2)
Total Protein: 6.2 g/dL — ABNORMAL LOW (ref 6.5–8.1)

## 2018-03-07 LAB — CBC WITH DIFFERENTIAL (CANCER CENTER ONLY)
Abs Immature Granulocytes: 0.01 10*3/uL (ref 0.00–0.07)
Basophils Absolute: 0 10*3/uL (ref 0.0–0.1)
Basophils Relative: 1 %
Eosinophils Absolute: 0.1 10*3/uL (ref 0.0–0.5)
Eosinophils Relative: 2 %
HCT: 30.1 % — ABNORMAL LOW (ref 36.0–46.0)
Hemoglobin: 9.6 g/dL — ABNORMAL LOW (ref 12.0–15.0)
Immature Granulocytes: 0 %
Lymphocytes Relative: 18 %
Lymphs Abs: 0.6 10*3/uL — ABNORMAL LOW (ref 0.7–4.0)
MCH: 27 pg (ref 26.0–34.0)
MCHC: 31.9 g/dL (ref 30.0–36.0)
MCV: 84.8 fL (ref 80.0–100.0)
Monocytes Absolute: 0.3 10*3/uL (ref 0.1–1.0)
Monocytes Relative: 9 %
Neutro Abs: 2.6 10*3/uL (ref 1.7–7.7)
Neutrophils Relative %: 70 %
Platelet Count: 194 10*3/uL (ref 150–400)
RBC: 3.55 MIL/uL — ABNORMAL LOW (ref 3.87–5.11)
RDW: 14.9 % (ref 11.5–15.5)
WBC Count: 3.6 10*3/uL — ABNORMAL LOW (ref 4.0–10.5)
nRBC: 0 % (ref 0.0–0.2)

## 2018-03-07 LAB — TYPE AND SCREEN
ABO/RH(D): A POS
Antibody Screen: NEGATIVE

## 2018-03-07 LAB — PRETREATMENT RBC PHENOTYPE

## 2018-03-07 MED ORDER — SODIUM CHLORIDE 0.9 % IV SOLN
16.5000 mg/kg | Freq: Once | INTRAVENOUS | Status: AC
  Administered 2018-03-07: 1400 mg via INTRAVENOUS
  Filled 2018-03-07: qty 60

## 2018-03-07 MED ORDER — METHYLPREDNISOLONE SODIUM SUCC 125 MG IJ SOLR
INTRAMUSCULAR | Status: AC
  Filled 2018-03-07: qty 2

## 2018-03-07 MED ORDER — SODIUM CHLORIDE 0.9 % IV SOLN
Freq: Once | INTRAVENOUS | Status: AC
  Administered 2018-03-07: 09:00:00 via INTRAVENOUS
  Filled 2018-03-07: qty 250

## 2018-03-07 MED ORDER — ACETAMINOPHEN 325 MG PO TABS
650.0000 mg | ORAL_TABLET | Freq: Once | ORAL | Status: AC
  Administered 2018-03-07: 650 mg via ORAL

## 2018-03-07 MED ORDER — ONDANSETRON HCL 8 MG PO TABS: 8.0000 mg | ORAL_TABLET | Freq: Two times a day (BID) | ORAL | 1 refills | Status: DC | PRN

## 2018-03-07 MED ORDER — ACYCLOVIR 400 MG PO TABS: 400.0000 mg | ORAL_TABLET | Freq: Two times a day (BID) | ORAL | 11 refills | Status: DC

## 2018-03-07 MED ORDER — METHYLPREDNISOLONE SODIUM SUCC 125 MG IJ SOLR
100.0000 mg | Freq: Once | INTRAMUSCULAR | Status: AC
  Administered 2018-03-07: 100 mg via INTRAVENOUS

## 2018-03-07 MED ORDER — PROCHLORPERAZINE MALEATE 10 MG PO TABS: 10.0000 mg | ORAL_TABLET | Freq: Four times a day (QID) | ORAL | 1 refills | Status: DC | PRN

## 2018-03-07 MED ORDER — DIPHENHYDRAMINE HCL 25 MG PO CAPS
50.0000 mg | ORAL_CAPSULE | Freq: Once | ORAL | Status: AC
  Administered 2018-03-07: 50 mg via ORAL

## 2018-03-07 MED ORDER — HEPARIN SOD (PORK) LOCK FLUSH 100 UNIT/ML IV SOLN
500.0000 [IU] | Freq: Once | INTRAVENOUS | Status: AC | PRN
  Administered 2018-03-07: 500 [IU]
  Filled 2018-03-07: qty 5

## 2018-03-07 MED ORDER — DIPHENHYDRAMINE HCL 25 MG PO CAPS
ORAL_CAPSULE | ORAL | Status: AC
  Filled 2018-03-07: qty 2

## 2018-03-07 MED ORDER — SODIUM CHLORIDE 0.9% FLUSH
10.0000 mL | INTRAVENOUS | Status: DC | PRN
  Administered 2018-03-07: 10 mL
  Filled 2018-03-07: qty 10

## 2018-03-07 MED ORDER — DEXAMETHASONE 4 MG PO TABS: 4.0000 mg | ORAL_TABLET | Freq: Every day | ORAL | 4 refills | Status: DC

## 2018-03-07 MED ORDER — ACETAMINOPHEN 325 MG PO TABS
ORAL_TABLET | ORAL | Status: AC
  Filled 2018-03-07: qty 2

## 2018-03-07 MED ORDER — LORAZEPAM 0.5 MG PO TABS: 0.5000 mg | ORAL_TABLET | Freq: Four times a day (QID) | ORAL | 0 refills | Status: DC | PRN

## 2018-03-07 NOTE — Progress Notes (Signed)
12:28 PM Dr Marin Olp notified of temp, no orders received.

## 2018-03-07 NOTE — Patient Instructions (Signed)
[facility] Discharge Instructions for Patients Receiving Chemotherapy  Today you received the following chemotherapy agents Daratumumab  To help prevent nausea and vomiting after your treatment, we encourage you to take your nausea medications as directed on the bottles. I have sent prescriptions to your pharmacy.   If you develop nausea and vomiting that is not controlled by your nausea medication, call the clinic.   BELOW ARE SYMPTOMS THAT SHOULD BE REPORTED IMMEDIATELY:  *FEVER GREATER THAN 100.5 F  *CHILLS WITH OR WITHOUT FEVER  NAUSEA AND VOMITING THAT IS NOT CONTROLLED WITH YOUR NAUSEA MEDICATION  *UNUSUAL SHORTNESS OF BREATH  *UNUSUAL BRUISING OR BLEEDING  TENDERNESS IN MOUTH AND THROAT WITH OR WITHOUT PRESENCE OF ULCERS  *URINARY PROBLEMS  *BOWEL PROBLEMS  UNUSUAL RASH Items with * indicate a potential emergency and should be followed up as soon as possible.  Feel free to call the clinic should you have any questions or concerns. The clinic phone number is [phone].  Please show the [facility] at check-in to the Emergency Department and triage nurse.  Daratumumab injection What is this medicine? DARATUMUMAB (dar a toom ue mab) is a monoclonal antibody. It is used to treat multiple myeloma. This medicine may be used for other purposes; ask your health care provider or pharmacist if you have questions. COMMON BRAND NAME(S): DARZALEX What should I tell my health care provider before I take this medicine? They need to know if you have any of these conditions: -infection (especially a virus infection such as chickenpox, herpes, or hepatitis B virus) -lung or breathing disease -an unusual or allergic reaction to daratumumab, other medicines, foods, dyes, or preservatives -pregnant or trying to get pregnant -breast-feeding How should I use this medicine? This medicine is for infusion into a vein. It is given by a health care professional in a  hospital or clinic setting. Talk to your pediatrician regarding the use of this medicine in children. Special care may be needed. Overdosage: If you think you have taken too much of this medicine contact a poison control center or emergency room at once. NOTE: This medicine is only for you. Do not share this medicine with others. What if I miss a dose? Keep appointments for follow-up doses as directed. It is important not to miss your dose. Call your doctor or health care professional if you are unable to keep an appointment. What may interact with this medicine? Interactions have not been studied. Give your health care provider a list of all the medicines, herbs, non-prescription drugs, or dietary supplements you use. Also tell them if you smoke, drink alcohol, or use illegal drugs. Some items may interact with your medicine. This list may not describe all possible interactions. Give your health care provider a list of all the medicines, herbs, non-prescription drugs, or dietary supplements you use. Also tell them if you smoke, drink alcohol, or use illegal drugs. Some items may interact with your medicine. What should I watch for while using this medicine? This drug may make you feel generally unwell. Report any side effects. Continue your course of treatment even though you feel ill unless your doctor tells you to stop. This medicine can cause serious allergic reactions. To reduce your risk you may need to take medicine before treatment with this medicine. Take your medicine as directed. This medicine can affect the results of blood tests to match your blood type. These changes can last for up to 6 months after the final dose. Your  healthcare provider will do blood tests to match your blood type before you start treatment. Tell all of your healthcare providers that you are being treated with this medicine before receiving a blood transfusion. This medicine can affect the results of some tests used  to determine treatment response; extra tests may be needed to evaluate response. Do not become pregnant while taking this medicine or for 3 months after stopping it. Women should inform their doctor if they wish to become pregnant or think they might be pregnant. There is a potential for serious side effects to an unborn child. Talk to your health care professional or pharmacist for more information. What side effects may I notice from receiving this medicine? Side effects that you should report to your doctor or health care professional as soon as possible: -allergic reactions like skin rash, itching or hives, swelling of the face, lips, or tongue -breathing problems -chills -cough -dizziness -feeling faint or lightheaded -headache -low blood counts - this medicine may decrease the number of white blood cells, red blood cells and platelets. You may be at increased risk for infections and bleeding. -nausea, vomiting -shortness of breath -signs of decreased platelets or bleeding - bruising, pinpoint red spots on the skin, black, tarry stools, blood in the urine -signs of decreased red blood cells - unusually weak or tired, feeling faint or lightheaded, falls -signs of infection - fever or chills, cough, sore throat, pain or difficulty passing urine -signs and symptoms of liver injury like dark yellow or brown urine; general ill feeling or flu-like symptoms; light-colored stools; loss of appetite; right upper belly pain; unusually weak or tired; yellowing of the eyes or skin Side effects that usually do not require medical attention (report to your doctor or health care professional if they continue or are bothersome): -back pain -constipation -loss of appetite -diarrhea -joint pain -muscle cramps -pain, tingling, numbness in the hands or feet -swelling of the ankles, feet, hands -tiredness -trouble sleeping This list may not describe all possible side effects. Call your doctor for medical  advice about side effects. You may report side effects to FDA at 1-800-FDA-1088. Where should I keep my medicine? Keep out of the reach of children. This drug is given in a hospital or clinic and will not be stored at home. NOTE: This sheet is a summary. It may not cover all possible information. If you have questions about this medicine, talk to your doctor, pharmacist, or health care provider.  2019 Elsevier/Gold Standard (2017-08-17 15:52:44)

## 2018-03-07 NOTE — Patient Instructions (Signed)

## 2018-03-11 ENCOUNTER — Other Ambulatory Visit: Payer: Self-pay

## 2018-03-11 ENCOUNTER — Telehealth: Payer: Self-pay

## 2018-03-11 DIAGNOSIS — C9002 Multiple myeloma in relapse: Secondary | ICD-10-CM

## 2018-03-11 MED ORDER — TRIAMTERENE-HCTZ 37.5-25 MG PO TABS: ORAL_TABLET | ORAL | 1 refills | Status: DC

## 2018-03-11 MED ORDER — AMLODIPINE BESYLATE 10 MG PO TABS: ORAL_TABLET | ORAL | 0 refills | Status: DC

## 2018-03-11 NOTE — Telephone Encounter (Signed)
Chemo follow up call completed today - patient is out but answered the call. She feels well and reports that she has not had any concerns or issues, other than minor fatigue the day after. No other questions at this time. Advised her to call if she develops anything like nausea or stool irregularities.

## 2018-03-13 ENCOUNTER — Other Ambulatory Visit: Payer: Self-pay | Admitting: *Deleted

## 2018-03-13 DIAGNOSIS — C9001 Multiple myeloma in remission: Secondary | ICD-10-CM

## 2018-03-14 ENCOUNTER — Inpatient Hospital Stay (HOSPITAL_BASED_OUTPATIENT_CLINIC_OR_DEPARTMENT_OTHER): Payer: Medicare Other | Admitting: Hematology & Oncology

## 2018-03-14 ENCOUNTER — Inpatient Hospital Stay: Payer: Medicare Other

## 2018-03-14 ENCOUNTER — Encounter: Payer: Self-pay | Admitting: Hematology & Oncology

## 2018-03-14 ENCOUNTER — Ambulatory Visit: Payer: Medicare Other | Admitting: Internal Medicine

## 2018-03-14 ENCOUNTER — Other Ambulatory Visit: Payer: Self-pay

## 2018-03-14 VITALS — BP 126/72 | HR 90 | Temp 98.6°F | Resp 18 | Wt 184.0 lb

## 2018-03-14 VITALS — BP 131/73 | HR 63 | Temp 98.2°F | Resp 17

## 2018-03-14 DIAGNOSIS — C9000 Multiple myeloma not having achieved remission: Secondary | ICD-10-CM

## 2018-03-14 DIAGNOSIS — Z79811 Long term (current) use of aromatase inhibitors: Secondary | ICD-10-CM | POA: Diagnosis not present

## 2018-03-14 DIAGNOSIS — C541 Malignant neoplasm of endometrium: Secondary | ICD-10-CM | POA: Diagnosis not present

## 2018-03-14 DIAGNOSIS — Z923 Personal history of irradiation: Secondary | ICD-10-CM | POA: Diagnosis not present

## 2018-03-14 DIAGNOSIS — D500 Iron deficiency anemia secondary to blood loss (chronic): Secondary | ICD-10-CM

## 2018-03-14 DIAGNOSIS — C9001 Multiple myeloma in remission: Secondary | ICD-10-CM

## 2018-03-14 DIAGNOSIS — Z7982 Long term (current) use of aspirin: Secondary | ICD-10-CM

## 2018-03-14 DIAGNOSIS — I10 Essential (primary) hypertension: Secondary | ICD-10-CM | POA: Diagnosis not present

## 2018-03-14 DIAGNOSIS — D509 Iron deficiency anemia, unspecified: Secondary | ICD-10-CM

## 2018-03-14 DIAGNOSIS — Z79899 Other long term (current) drug therapy: Secondary | ICD-10-CM

## 2018-03-14 DIAGNOSIS — Z5112 Encounter for antineoplastic immunotherapy: Secondary | ICD-10-CM | POA: Diagnosis not present

## 2018-03-14 DIAGNOSIS — R319 Hematuria, unspecified: Secondary | ICD-10-CM | POA: Diagnosis not present

## 2018-03-14 LAB — CMP (CANCER CENTER ONLY)
ALT: 23 U/L (ref 0–44)
AST: 13 U/L — ABNORMAL LOW (ref 15–41)
Albumin: 4.2 g/dL (ref 3.5–5.0)
Alkaline Phosphatase: 47 U/L (ref 38–126)
Anion gap: 9 (ref 5–15)
BUN: 15 mg/dL (ref 8–23)
CO2: 26 mmol/L (ref 22–32)
Calcium: 9.8 mg/dL (ref 8.9–10.3)
Chloride: 102 mmol/L (ref 98–111)
Creatinine: 0.99 mg/dL (ref 0.44–1.00)
GFR, Est AFR Am: >60 mL/min (ref >60)
GFR, Estimated: 60 mL/min — ABNORMAL LOW (ref >60)
Glucose, Bld: 106 mg/dL — ABNORMAL HIGH (ref 70–99)
Potassium: 4 mmol/L (ref 3.5–5.1)
Sodium: 137 mmol/L (ref 135–145)
Total Bilirubin: 0.3 mg/dL (ref 0.3–1.2)
Total Protein: 6.6 g/dL (ref 6.5–8.1)

## 2018-03-14 LAB — CBC WITH DIFFERENTIAL (CANCER CENTER ONLY)
Abs Immature Granulocytes: 0.02 10*3/uL (ref 0.00–0.07)
Basophils Absolute: 0 10*3/uL (ref 0.0–0.1)
Basophils Relative: 1 %
Eosinophils Absolute: 0.2 10*3/uL (ref 0.0–0.5)
Eosinophils Relative: 4 %
HCT: 34.4 % — ABNORMAL LOW (ref 36.0–46.0)
Hemoglobin: 11.1 g/dL — ABNORMAL LOW (ref 12.0–15.0)
Immature Granulocytes: 1 %
Lymphocytes Relative: 8 %
Lymphs Abs: 0.3 10*3/uL — ABNORMAL LOW (ref 0.7–4.0)
MCH: 27.1 pg (ref 26.0–34.0)
MCHC: 32.3 g/dL (ref 30.0–36.0)
MCV: 83.9 fL (ref 80.0–100.0)
Monocytes Absolute: 0.5 10*3/uL (ref 0.1–1.0)
Monocytes Relative: 12 %
Neutro Abs: 2.9 10*3/uL (ref 1.7–7.7)
Neutrophils Relative %: 74 %
Platelet Count: 214 10*3/uL (ref 150–400)
RBC: 4.1 MIL/uL (ref 3.87–5.11)
RDW: 14.8 % (ref 11.5–15.5)
WBC Count: 3.9 10*3/uL — ABNORMAL LOW (ref 4.0–10.5)
nRBC: 0 % (ref 0.0–0.2)

## 2018-03-14 MED ORDER — SODIUM CHLORIDE 0.9% FLUSH
10.0000 mL | INTRAVENOUS | Status: DC | PRN
  Administered 2018-03-14: 10 mL
  Filled 2018-03-14: qty 10

## 2018-03-14 MED ORDER — DENOSUMAB 120 MG/1.7ML ~~LOC~~ SOLN
120.0000 mg | Freq: Once | SUBCUTANEOUS | Status: AC
  Administered 2018-03-14: 120 mg via SUBCUTANEOUS

## 2018-03-14 MED ORDER — DIPHENHYDRAMINE HCL 25 MG PO CAPS
50.0000 mg | ORAL_CAPSULE | Freq: Once | ORAL | Status: AC
  Administered 2018-03-14: 50 mg via ORAL

## 2018-03-14 MED ORDER — SODIUM CHLORIDE 0.9 % IV SOLN
Freq: Once | INTRAVENOUS | Status: AC
  Administered 2018-03-14: 10:00:00 via INTRAVENOUS
  Filled 2018-03-14: qty 250

## 2018-03-14 MED ORDER — METHYLPREDNISOLONE SODIUM SUCC 125 MG IJ SOLR
INTRAMUSCULAR | Status: AC
  Filled 2018-03-14: qty 2

## 2018-03-14 MED ORDER — HEPARIN SOD (PORK) LOCK FLUSH 100 UNIT/ML IV SOLN
500.0000 [IU] | Freq: Once | INTRAVENOUS | Status: AC | PRN
  Administered 2018-03-14: 500 [IU]
  Filled 2018-03-14: qty 5

## 2018-03-14 MED ORDER — DIPHENHYDRAMINE HCL 25 MG PO CAPS
ORAL_CAPSULE | ORAL | Status: AC
  Filled 2018-03-14: qty 2

## 2018-03-14 MED ORDER — ACETAMINOPHEN 325 MG PO TABS
650.0000 mg | ORAL_TABLET | Freq: Once | ORAL | Status: AC
  Administered 2018-03-14: 650 mg via ORAL

## 2018-03-14 MED ORDER — METHYLPREDNISOLONE SODIUM SUCC 125 MG IJ SOLR
100.0000 mg | Freq: Once | INTRAMUSCULAR | Status: AC
  Administered 2018-03-14: 100 mg via INTRAVENOUS

## 2018-03-14 MED ORDER — ACETAMINOPHEN 325 MG PO TABS
ORAL_TABLET | ORAL | Status: AC
  Filled 2018-03-14: qty 2

## 2018-03-14 MED ORDER — DENOSUMAB 120 MG/1.7ML ~~LOC~~ SOLN
SUBCUTANEOUS | Status: AC
  Filled 2018-03-14: qty 1.7

## 2018-03-14 MED ORDER — SODIUM CHLORIDE 0.9 % IV SOLN
16.5000 mg/kg | Freq: Once | INTRAVENOUS | Status: AC
  Administered 2018-03-14: 1400 mg via INTRAVENOUS
  Filled 2018-03-14: qty 60

## 2018-03-14 MED ORDER — DEXAMETHASONE 4 MG PO TABS: ORAL_TABLET | ORAL | 4 refills | Status: DC

## 2018-03-14 NOTE — Progress Notes (Signed)
Hematology and Oncology Follow Up Visit  ALDEAN DONAHO [id number] 1950/06/16 68 y.o. 03/14/2018   Principle Diagnosis:  Recurrent lambda light chain myeloma History of recurrent endometrial carcinoma Iron deficiency anemia -blood loss  PastTherapy: Status post second autologous stem cell transplant on 07/24/2014 Maintenance therapy with Pomalidomide/every 2 week Velcade - d/c'ed Xgeva 120 mg subcutaneous every 3months -next dose inNovember 2019 Radiation therapy for endometrial recurrence - completed 04/20/2015  Current Therapy:   Pomalyst/Kyprolis 70mg /m2 IV q 2 weeks - s/p cycle #12 -- held on 12/26/2017 for hematuria Femara 2.5 mg po q day  IV iron as indicated Daratumumab/Pomalyst/Decadron -- start on 03/06/2018   Interim History:  Ms. Gatrell is here today for follow-up.  We went ahead and started her on the daratumumab.  She has done well with this.  She is only had 1 cycle so far.  I did make an adjustment with her Decadron.  She was not taking it properly.  I told her that she can take 5 pills (20 mg) once a week.  She is to take it the day after her daratumumab.  She has had no change in bowel or bladder habits.  She has had no nausea or vomiting.  Her mother is still with her.  Her mother was diagnosed with a blood clot in the lung.  She lives in [place].  I am not sure when her mother will go back home.  She has had no bleeding.  There has been no issues with respect to her uterine cancer.  She is on Femara and doing well with this.  When we last saw her, she did have some slight iron deficiency.  Her ferritin was 621 with an iron saturation of 20%.  We did go ahead and give her some IV iron.  Her hemoglobin is much better today at 11.1.  Her last light chain (lambda light chain) in late January was 16.1 mg/dL.  Overall, her performance status is ECOG 1.    Medications:  Allergies as of 03/14/2018      Reactions   Codeine Nausea Only        Medication List       Accurate as of March 14, 2018 11:35 AM. Always use your most recent med list.        acyclovir 400 MG tablet Commonly known as:  ZOVIRAX Take 1 tablet (400 mg total) by mouth 2 (two) times daily.   albuterol 108 (90 Base) MCG/ACT inhaler Commonly known as:  PROVENTIL HFA;VENTOLIN HFA Inhale 2 puffs into the lungs every 6 (six) hours as needed for wheezing or shortness of breath. 2 puffs 3 times daily x 5 days then every 6 hours as needed.   amLODipine 10 MG tablet Commonly known as:  NORVASC TAKE 1 TABLET BY MOUTH EVERY MORNING   aspirin EC 81 MG tablet Take 81 mg by mouth 2 (two) times daily. Stopped  By dr Myna Hidalgo until urology procedures complete   dexamethasone 4 MG tablet Commonly known as:  DECADRON Take 5 pills with food once a week day after chemotherapy   granisetron 3.1 MG/24HR Commonly known as:  SANCUSO APPLY TO SKIN STARTING 24 HOURS BEFORE CHEMO REMOVE AFTER 7 DAYS   letrozole 2.5 MG tablet Commonly known as:  FEMARA Take 1 tablet (2.5 mg total) by mouth daily.   loperamide 2 MG capsule Commonly known as:  IMODIUM Take by mouth as needed for diarrhea or loose stools. Reported on 08/19/2015   loratadine 10  MG tablet Commonly known as:  CLARITIN Take 10 mg by mouth every morning. Reported on 08/19/2015   LORazepam 0.5 MG tablet Commonly known as:  ATIVAN Take 1 tablet (0.5 mg total) by mouth every 6 (six) hours as needed (Nausea or vomiting).   montelukast 10 MG tablet Commonly known as:  SINGULAIR Take 1 tablet (10 mg total) by mouth at bedtime.   NASACORT ALLERGY 24HR 55 MCG/ACT Aero nasal inhaler Generic drug:  triamcinolone Place 2 sprays into the nose as needed.   ondansetron 8 MG tablet Commonly known as:  ZOFRAN Take 1 tablet (8 mg total) by mouth 2 (two) times daily as needed (Nausea or vomiting).   pomalidomide 3 MG capsule Commonly known as:  POMALYST Take with water on days 1-21. Repeat every 28 days.  [id number]   potassium chloride SA 20 MEQ tablet Commonly known as:  K-DUR,KLOR-CON TAKE 1 TABLET(20 MEQ) BY MOUTH TWICE DAILY   prochlorperazine 10 MG tablet Commonly known as:  COMPAZINE Take 1 tablet (10 mg total) by mouth every 6 (six) hours as needed (Nausea or vomiting).   traMADol 50 MG tablet Commonly known as:  ULTRAM Take 1 tablet (50 mg total) by mouth every 6 (six) hours as needed.   triamterene-hydrochlorothiazide 37.5-25 MG tablet Commonly known as:  MAXZIDE-25 1 tablet by mouth at bedtime   Vitamin D3 50 MCG (2000 UT) Tabs Take 2 tablets by mouth daily.       Allergies:  Allergies  Allergen Reactions  . Codeine Nausea Only    Past Medical History, Surgical history, Social history, and Family History were reviewed and updated.  Review of Systems: Review of Systems  Constitutional: Negative.   HENT: Negative.   Eyes: Negative.   Respiratory: Negative.   Cardiovascular: Negative.   Gastrointestinal: Negative.   Genitourinary: Positive for hematuria.  Musculoskeletal: Negative.   Skin: Negative.   Neurological: Negative.   Endo/Heme/Allergies: Bruises/bleeds easily.  Psychiatric/Behavioral: Negative.    [name] Kitchen   Physical Exam:  weight is 184 lb (83.5 kg). Her oral temperature is 98.6 F (37 C). Her blood pressure is 126/72 and her pulse is 90. Her respiration is 18 and oxygen saturation is 99%.   Wt Readings from Last 3 Encounters:  03/14/18 184 lb (83.5 kg)  02/27/18 186 lb 12.8 oz (84.7 kg)  02/18/18 186 lb (84.4 kg)    Her vital signs that were taken show temperature of 99.3.  Pulse 75.  Blood pressure 135/67.  Weight is 192 pounds.  Physical Exam Vitals signs reviewed.  HENT:     Head: Normocephalic and atraumatic.  Eyes:     Pupils: Pupils are equal, round, and reactive to light.  Neck:     Musculoskeletal: Normal range of motion.  Cardiovascular:     Rate and Rhythm: Normal rate and regular rhythm.     Heart sounds: Normal heart  sounds.  Pulmonary:     Effort: Pulmonary effort is normal.     Breath sounds: Normal breath sounds.  Abdominal:     General: Bowel sounds are normal.     Palpations: Abdomen is soft.  Musculoskeletal: Normal range of motion.        General: No tenderness or deformity.  Lymphadenopathy:     Cervical: No cervical adenopathy.  Skin:    General: Skin is warm and dry.     Findings: No erythema or rash.  Neurological:     Mental Status: She is alert and oriented to person, place, and time.  Psychiatric:        Behavior: Behavior normal.        Thought Content: Thought content normal.        Judgment: Judgment normal.      Lab Results  Component Value Date   WBC 3.9 (L) 03/14/2018   HGB 11.1 (L) 03/14/2018   HCT 34.4 (L) 03/14/2018   MCV 83.9 03/14/2018   PLT 214 03/14/2018   Lab Results  Component Value Date   FERRITIN 621 (H) 02/27/2018   IRON 60 02/27/2018   TIBC 300 02/27/2018   UIBC 240 02/27/2018   IRONPCTSAT 20 (L) 02/27/2018   Lab Results  Component Value Date   RETICCTPCT 2.9 (H) 10/17/2017   RBC 4.10 03/14/2018   Lab Results  Component Value Date   KPAFRELGTCHN 5.7 02/27/2018   LAMBDASER 161.0 (H) 02/27/2018   KAPLAMBRATIO 0.04 (L) 02/27/2018   Lab Results  Component Value Date   IGGSERUM 593 (L) 02/27/2018   IGA 48 (L) 02/27/2018   IGMSERUM 7 (L) 02/27/2018   Lab Results  Component Value Date   TOTALPROTELP 6.0 02/27/2018   ALBUMINELP 3.7 02/27/2018   A1GS 0.2 02/27/2018   A2GS 0.9 02/27/2018   BETS 0.9 02/27/2018   BETA2SER 0.4 11/23/2014   GAMS 0.4 02/27/2018   MSPIKE Not Observed 02/27/2018   SPEI Comment 12/26/2017     Chemistry      Component Value Date/Time   NA 137 03/14/2018 0840   NA 141 01/10/2017 1115   NA 140 06/21/2016 0918   K 4.0 03/14/2018 0840   K 4.0 01/10/2017 1115   K 4.3 06/21/2016 0918   CL 102 03/14/2018 0840   CL 106 01/10/2017 1115   CO2 26 03/14/2018 0840   CO2 27 01/10/2017 1115   CO2 20 (L) 06/21/2016  0918   BUN 15 03/14/2018 0840   BUN 15 01/10/2017 1115   BUN 15.8 06/21/2016 0918   CREATININE 0.99 03/14/2018 0840   CREATININE 1.0 01/10/2017 1115   CREATININE 0.8 06/21/2016 0918      Component Value Date/Time   CALCIUM 9.8 03/14/2018 0840   CALCIUM 9.5 01/10/2017 1115   CALCIUM 9.4 06/21/2016 0918   ALKPHOS 47 03/14/2018 0840   ALKPHOS 40 01/10/2017 1115   ALKPHOS 66 06/21/2016 0918   AST 13 (L) 03/14/2018 0840   AST 17 06/21/2016 0918   ALT 23 03/14/2018 0840   ALT 19 01/10/2017 1115   ALT 37 06/21/2016 0918   BILITOT 0.3 03/14/2018 0840   BILITOT 0.32 06/21/2016 0918       Impression and Plan: Ms. Sickles is a very pleasant 68 yo African American female with recurrent lambda light chain myeloma and also localized recurrent endometrial cancer (followed by gyn onc Dr. Nelly Rout).   Her second stem cell transplant for the light chain myeloma was in June 2016.   We will continue her on the weekly daratumumab.  I will see her back in about 4 weeks.  Hopefully, by then, we will see her lambda light chain improving.  Josph Macho, MD 2/13/202011:35 AM

## 2018-03-14 NOTE — Patient Instructions (Signed)
Implanted Port Insertion, Care After  This sheet gives you information about how to care for yourself after your procedure. Your health care provider may also give you more specific instructions. If you have problems or questions, contact your health care provider.  What can I expect after the procedure?  After the procedure, it is common to have:  · Discomfort at the port insertion site.  · Bruising on the skin over the port. This should improve over 3-4 days.  Follow these instructions at home:  Port care  · After your port is placed, you will get a manufacturer's information card. The card has information about your port. Keep this card with you at all times.  · Take care of the port as told by your health care provider. Ask your health care provider if you or a family member can get training for taking care of the port at home. A home health care nurse may also take care of the port.  · Make sure to remember what type of port you have.  Incision care         · Follow instructions from your health care provider about how to take care of your port insertion site. Make sure you:  ? Wash your hands with soap and water before and after you change your bandage (dressing). If soap and water are not available, use hand sanitizer.  ? Change your dressing as told by your health care provider.  ? Leave stitches (sutures), skin glue, or adhesive strips in place. These skin closures may need to stay in place for 2 weeks or longer. If adhesive strip edges start to loosen and curl up, you may trim the loose edges. Do not remove adhesive strips completely unless your health care provider tells you to do that.  · Check your port insertion site every day for signs of infection. Check for:  ? Redness, swelling, or pain.  ? Fluid or blood.  ? Warmth.  ? Pus or a bad smell.  Activity  · Return to your normal activities as told by your health care provider. Ask your health care provider what activities are safe for you.  · Do not  lift anything that is heavier than 10 lb (4.5 kg), or the limit that you are told, until your health care provider says that it is safe.  General instructions  · Take over-the-counter and prescription medicines only as told by your health care provider.  · Do not take baths, swim, or use a hot tub until your health care provider approves. Ask your health care provider if you may take showers. You may only be allowed to take sponge baths.  · Do not drive for 24 hours if you were given a sedative during your procedure.  · Wear a medical alert bracelet in case of an emergency. This will tell any health care providers that you have a port.  · Keep all follow-up visits as told by your health care provider. This is important.  Contact a health care provider if:  · You cannot flush your port with saline as directed, or you cannot draw blood from the port.  · You have a fever or chills.  · You have redness, swelling, or pain around your port insertion site.  · You have fluid or blood coming from your port insertion site.  · Your port insertion site feels warm to the touch.  · You have pus or a bad smell coming from the port   insertion site.  Get help right away if:  · You have chest pain or shortness of breath.  · You have bleeding from your port that you cannot control.  Summary  · Take care of the port as told by your health care provider. Keep the manufacturer's information card with you at all times.  · Change your dressing as told by your health care provider.  · Contact a health care provider if you have a fever or chills or if you have redness, swelling, or pain around your port insertion site.  · Keep all follow-up visits as told by your health care provider.  This information is not intended to replace advice given to you by your health care provider. Make sure you discuss any questions you have with your health care provider.  Document Released: 11/06/2012 Document Revised: 08/14/2017 Document Reviewed:  08/14/2017  Elsevier Interactive Patient Education © 2019 [employer]

## 2018-03-14 NOTE — Patient Instructions (Signed)
Denosumab injection What is this medicine? DENOSUMAB (den oh sue mab) slows bone breakdown. Prolia is used to treat osteoporosis in women after menopause and in men, and in people who are taking corticosteroids for 6 months or more. Xgeva is used to treat a high calcium level due to cancer and to prevent bone fractures and other bone problems caused by multiple myeloma or cancer bone metastases. Xgeva is also used to treat giant cell tumor of the bone. This medicine may be used for other purposes; ask your health care provider or pharmacist if you have questions. COMMON BRAND NAME(S): Prolia, XGEVA What should I tell my health care provider before I take this medicine? They need to know if you have any of these conditions: -dental disease -having surgery or tooth extraction -infection -kidney disease -low levels of calcium or Vitamin D in the blood -malnutrition -on hemodialysis -skin conditions or sensitivity -thyroid or parathyroid disease -an unusual reaction to denosumab, other medicines, foods, dyes, or preservatives -pregnant or trying to get pregnant -breast-feeding How should I use this medicine? This medicine is for injection under the skin. It is given by a health care professional in a hospital or clinic setting. A special MedGuide will be given to you before each treatment. Be sure to read this information carefully each time. For Prolia, talk to your pediatrician regarding the use of this medicine in children. Special care may be needed. For Xgeva, talk to your pediatrician regarding the use of this medicine in children. While this drug may be prescribed for children as young as 13 years for selected conditions, precautions do apply. Overdosage: If you think you have taken too much of this medicine contact a poison control center or emergency room at once. NOTE: This medicine is only for you. Do not share this medicine with others. What if I miss a dose? It is important not to  miss your dose. Call your doctor or health care professional if you are unable to keep an appointment. What may interact with this medicine? Do not take this medicine with any of the following medications: -other medicines containing denosumab This medicine may also interact with the following medications: -medicines that lower your chance of fighting infection -steroid medicines like prednisone or cortisone This list may not describe all possible interactions. Give your health care provider a list of all the medicines, herbs, non-prescription drugs, or dietary supplements you use. Also tell them if you smoke, drink alcohol, or use illegal drugs. Some items may interact with your medicine. What should I watch for while using this medicine? Visit your doctor or health care professional for regular checks on your progress. Your doctor or health care professional may order blood tests and other tests to see how you are doing. Call your doctor or health care professional for advice if you get a fever, chills or sore throat, or other symptoms of a cold or flu. Do not treat yourself. This drug may decrease your body's ability to fight infection. Try to avoid being around people who are sick. You should make sure you get enough calcium and vitamin D while you are taking this medicine, unless your doctor tells you not to. Discuss the foods you eat and the vitamins you take with your health care professional. See your dentist regularly. Brush and floss your teeth as directed. Before you have any dental work done, tell your dentist you are receiving this medicine. Do not become pregnant while taking this medicine or for 5 months   after stopping it. Talk with your doctor or health care professional about your birth control options while taking this medicine. Women should inform their doctor if they wish to become pregnant or think they might be pregnant. There is a potential for serious side effects to an unborn  child. Talk to your health care professional or pharmacist for more information. What side effects may I notice from receiving this medicine? Side effects that you should report to your doctor or health care professional as soon as possible: -allergic reactions like skin rash, itching or hives, swelling of the face, lips, or tongue -bone pain -breathing problems -dizziness -jaw pain, especially after dental work -redness, blistering, peeling of the skin -signs and symptoms of infection like fever or chills; cough; sore throat; pain or trouble passing urine -signs of low calcium like fast heartbeat, muscle cramps or muscle pain; pain, tingling, numbness in the hands or feet; seizures -unusual bleeding or bruising -unusually weak or tired Side effects that usually do not require medical attention (report to your doctor or health care professional if they continue or are bothersome): -constipation -diarrhea -headache -joint pain -loss of appetite -muscle pain -runny nose -tiredness -upset stomach This list may not describe all possible side effects. Call your doctor for medical advice about side effects. You may report side effects to FDA at 1-800-FDA-1088. Where should I keep my medicine? This medicine is only given in a clinic, doctor's office, or other health care setting and will not be stored at home. NOTE: This sheet is a summary. It may not cover all possible information. If you have questions about this medicine, talk to your doctor, pharmacist, or health care provider.  2019 Elsevier/Gold Standard (2017-05-25 16:10:44) Daratumumab injection What is this medicine? DARATUMUMAB (dar a toom ue mab) is a monoclonal antibody. It is used to treat multiple myeloma. This medicine may be used for other purposes; ask your health care provider or pharmacist if you have questions. COMMON BRAND NAME(S): DARZALEX What should I tell my health care provider before I take this medicine? They  need to know if you have any of these conditions: -infection (especially a virus infection such as chickenpox, herpes, or hepatitis B virus) -lung or breathing disease -an unusual or allergic reaction to daratumumab, other medicines, foods, dyes, or preservatives -pregnant or trying to get pregnant -breast-feeding How should I use this medicine? This medicine is for infusion into a vein. It is given by a health care professional in a hospital or clinic setting. Talk to your pediatrician regarding the use of this medicine in children. Special care may be needed. Overdosage: If you think you have taken too much of this medicine contact a poison control center or emergency room at once. NOTE: This medicine is only for you. Do not share this medicine with others. What if I miss a dose? Keep appointments for follow-up doses as directed. It is important not to miss your dose. Call your doctor or health care professional if you are unable to keep an appointment. What may interact with this medicine? Interactions have not been studied. Give your health care provider a list of all the medicines, herbs, non-prescription drugs, or dietary supplements you use. Also tell them if you smoke, drink alcohol, or use illegal drugs. Some items may interact with your medicine. This list may not describe all possible interactions. Give your health care provider a list of all the medicines, herbs, non-prescription drugs, or dietary supplements you use. Also tell them if you  smoke, drink alcohol, or use illegal drugs. Some items may interact with your medicine. What should I watch for while using this medicine? This drug may make you feel generally unwell. Report any side effects. Continue your course of treatment even though you feel ill unless your doctor tells you to stop. This medicine can cause serious allergic reactions. To reduce your risk you may need to take medicine before treatment with this medicine. Take  your medicine as directed. This medicine can affect the results of blood tests to match your blood type. These changes can last for up to 6 months after the final dose. Your healthcare provider will do blood tests to match your blood type before you start treatment. Tell all of your healthcare providers that you are being treated with this medicine before receiving a blood transfusion. This medicine can affect the results of some tests used to determine treatment response; extra tests may be needed to evaluate response. Do not become pregnant while taking this medicine or for 3 months after stopping it. Women should inform their doctor if they wish to become pregnant or think they might be pregnant. There is a potential for serious side effects to an unborn child. Talk to your health care professional or pharmacist for more information. What side effects may I notice from receiving this medicine? Side effects that you should report to your doctor or health care professional as soon as possible: -allergic reactions like skin rash, itching or hives, swelling of the face, lips, or tongue -breathing problems -chills -cough -dizziness -feeling faint or lightheaded -headache -low blood counts - this medicine may decrease the number of white blood cells, red blood cells and platelets. You may be at increased risk for infections and bleeding. -nausea, vomiting -shortness of breath -signs of decreased platelets or bleeding - bruising, pinpoint red spots on the skin, black, tarry stools, blood in the urine -signs of decreased red blood cells - unusually weak or tired, feeling faint or lightheaded, falls -signs of infection - fever or chills, cough, sore throat, pain or difficulty passing urine -signs and symptoms of liver injury like dark yellow or brown urine; general ill feeling or flu-like symptoms; light-colored stools; loss of appetite; right upper belly pain; unusually weak or tired; yellowing of the  eyes or skin Side effects that usually do not require medical attention (report to your doctor or health care professional if they continue or are bothersome): -back pain -constipation -loss of appetite -diarrhea -joint pain -muscle cramps -pain, tingling, numbness in the hands or feet -swelling of the ankles, feet, hands -tiredness -trouble sleeping This list may not describe all possible side effects. Call your doctor for medical advice about side effects. You may report side effects to FDA at 1-800-FDA-1088. Where should I keep my medicine? Keep out of the reach of children. This drug is given in a hospital or clinic and will not be stored at home. NOTE: This sheet is a summary. It may not cover all possible information. If you have questions about this medicine, talk to your doctor, pharmacist, or health care provider.  2019 Elsevier/Gold Standard (2017-08-17 15:52:44)

## 2018-03-19 ENCOUNTER — Ambulatory Visit (INDEPENDENT_AMBULATORY_CARE_PROVIDER_SITE_OTHER): Payer: Medicare Other | Admitting: Internal Medicine

## 2018-03-19 ENCOUNTER — Encounter: Payer: Self-pay | Admitting: Internal Medicine

## 2018-03-19 VITALS — BP 120/76 | HR 79 | Temp 98.9°F | Ht 63.0 in | Wt 187.8 lb

## 2018-03-19 DIAGNOSIS — Z6833 Body mass index (BMI) 33.0-33.9, adult: Secondary | ICD-10-CM | POA: Diagnosis not present

## 2018-03-19 DIAGNOSIS — C7982 Secondary malignant neoplasm of genital organs: Secondary | ICD-10-CM

## 2018-03-19 DIAGNOSIS — C541 Malignant neoplasm of endometrium: Secondary | ICD-10-CM

## 2018-03-19 DIAGNOSIS — R87629 Unspecified abnormal cytological findings in specimens from vagina: Secondary | ICD-10-CM | POA: Diagnosis not present

## 2018-03-19 DIAGNOSIS — E6609 Other obesity due to excess calories: Secondary | ICD-10-CM

## 2018-03-19 DIAGNOSIS — I10 Essential (primary) hypertension: Secondary | ICD-10-CM | POA: Diagnosis not present

## 2018-03-19 DIAGNOSIS — Z712 Person consulting for explanation of examination or test findings: Secondary | ICD-10-CM | POA: Diagnosis not present

## 2018-03-19 NOTE — Progress Notes (Signed)
Subjective:     Patient ID: Emily Greer , female    DOB: 09/20/1950 , 68 y.o.   MRN: [id number]   Chief Complaint  Patient presents with  . Hypertension    HPI  Hypertension  This is a chronic problem. The current episode started more than 1 year ago. The problem has been gradually improving since onset. The problem is controlled. Pertinent negatives include no blurred vision, chest pain, palpitations or shortness of breath.  She reports compliance with meds. Unfortunately, her Father has passed since her last visit with me.   Past Medical History:  Diagnosis Date  . Avascular necrosis of femoral head (HCC)    bilateral per CT 07-26-2015  . Endometrial carcinoma [facility]) gyn oncologist-  dr Nelly Rout ([facility])/  radiation oncologist-- dr Roselind Messier   2013 dx  FIGO Stage 1A, Grade 2 endometrioid endometrial cancer s/p TAH w/ BSO and bilateral pelvic node dissection 10-31-2011 ;  recurrence at distal vagina 04/ 2014 s/p  brachytherapy (ended 07-29-2012);  2nd recurrence 12/ 2016  vaginal apex s/p  conformational radiotherapy 03-10-2015 to 04-20-2015  . Family history of adverse reaction to anesthesia    mother ponv  . GERD (gastroesophageal reflux disease)   . H/O stem cell transplant (HCC)    02/ 2000 and second one 06/ 2016  . 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 History of chemotherapy    last chemo 12-26-17  . History of radiation therapy 6/4, 6/11, 6/19, 6/25, 07/29/2012   vagina 30.5 gray in 5 fx, HDR brachytherapy:   last radiation to vagina 03-10-2015 to 04-20-2015  50.4gray  . History of radiation therapy 03/10/15-04/20/15   vagina 50.4 in 28 fractions  . Hypertension   . Lambda light chain myeloma [facility]) oncologist-  dr Myna Hidalgo ([facility])  and  [facility] -- dr Silvestre Mesi gasparetto   dx 07/ 1999 s/p  VAD chemotherapy 11/ 1999,  purged autotransplant 03-21-1998 followed by auto stem cell transplant 03-29-1998;   recurrance w/ second autologous stem cell transplant 07-24-2014;  in Re-mission currently , chemo maintenance therapy  . Osteoporosis 12/18/05   Increased  risk   . PONV (postoperative nausea and vomiting)   . Wears glasses      Family History  Problem Relation Age of Onset  . Colon cancer Mother   . Hypertension Father   . Heart Problems Father      Current Outpatient Medications:  .  acyclovir (ZOVIRAX) 400 MG tablet, Take 1 tablet (400 mg total) by mouth 2 (two) times daily., Disp: 60 tablet, Rfl: 11 .  albuterol (PROVENTIL HFA;VENTOLIN HFA) 108 (90 Base) MCG/ACT inhaler, Inhale 2 puffs into the lungs every 6 (six) hours as needed for wheezing or shortness of breath. 2 puffs 3 times daily x 5 days then every 6 hours as needed., Disp: 1 Inhaler, Rfl: 3 .  amLODipine (NORVASC) 10 MG tablet, TAKE 1 TABLET BY MOUTH EVERY MORNING, Disp: 90 tablet, Rfl: 0 .  aspirin EC 81 MG tablet, Take 81 mg by mouth 2 (two) times daily. Stopped  By dr Myna Hidalgo until urology procedures complete, Disp: , Rfl:  .  Cholecalciferol (VITAMIN D3) 2000 UNITS TABS, Take 2 tablets by mouth daily. , Disp: , Rfl:  .  dexamethasone (DECADRON) 4 MG tablet, Take 5 pills with food once a week day after chemotherapy (Patient taking differently: Take 20 mg by mouth once a week. Take 20 mg once a  week with food.), Disp: 40 tablet, Rfl: 4 .  granisetron (SANCUSO) 3.1 MG/24HR, APPLY TO SKIN STARTING 24 HOURS BEFORE CHEMO REMOVE AFTER 7 DAYS, Disp: 3 patch, Rfl: 4 .  letrozole (FEMARA) 2.5 MG tablet, Take 1 tablet (2.5 mg total) by mouth daily., Disp: 60 tablet, Rfl: 4 .  loperamide (IMODIUM) 2 MG capsule, Take by mouth as needed for diarrhea or loose stools. Reported on 08/19/2015, Disp: , Rfl:  .  loratadine (CLARITIN) 10 MG tablet, Take 10 mg by mouth every morning. Reported on 08/19/2015, Disp: , Rfl:  .  LORazepam (ATIVAN) 0.5 MG tablet, Take 1 tablet (0.5 mg total) by mouth every 6 (six) hours as needed (Nausea or vomiting).,  Disp: 30 tablet, Rfl: 0 .  montelukast (SINGULAIR) 10 MG tablet, Take 1 tablet (10 mg total) by mouth at bedtime. (Patient taking differently: Take 10 mg by mouth daily. ), Disp: 30 tablet, Rfl: 1 .  ondansetron (ZOFRAN) 8 MG tablet, Take 1 tablet (8 mg total) by mouth 2 (two) times daily as needed (Nausea or vomiting)., Disp: 30 tablet, Rfl: 1 .  pomalidomide (POMALYST) 3 MG capsule, Take with water on days 1-21. Repeat every 28 days. ZOXW#[id number], Disp: 21 capsule, Rfl: 4 .  potassium chloride SA (K-DUR,KLOR-CON) 20 MEQ tablet, TAKE 1 TABLET(20 MEQ) BY MOUTH TWICE DAILY, Disp: 60 tablet, Rfl: 0 .  prochlorperazine (COMPAZINE) 10 MG tablet, Take 1 tablet (10 mg total) by mouth every 6 (six) hours as needed (Nausea or vomiting)., Disp: 30 tablet, Rfl: 1 .  traMADol (ULTRAM) 50 MG tablet, Take 1 tablet (50 mg total) by mouth every 6 (six) hours as needed., Disp: 30 tablet, Rfl: 0 .  triamcinolone (NASACORT ALLERGY 24HR) 55 MCG/ACT AERO nasal inhaler, Place 2 sprays into the nose as needed. , Disp: , Rfl:  .  triamterene-hydrochlorothiazide (MAXZIDE-25) 37.5-25 MG tablet, 1 tablet by mouth at bedtime, Disp: 90 tablet, Rfl: 1 No current facility-administered medications for this visit.   Facility-Administered Medications Ordered in Other Visits:  .  sodium chloride flush (NS) 0.9 % injection 10 mL, 10 mL, Intravenous, PRN, [place], Sarah M, NP, 10 mL at 12/26/17 1130   Allergies  Allergen Reactions  . Codeine Nausea Only     Review of Systems  Constitutional: Negative.   Eyes: Negative for blurred vision.  Respiratory: Negative.  Negative for shortness of breath.   Cardiovascular: Negative.  Negative for chest pain and palpitations.  Gastrointestinal: Negative.   Neurological: Negative.   Psychiatric/Behavioral: Negative.      Today's Vitals   03/19/18 1509  BP: 120/76  Pulse: 79  Temp: 98.9 F (37.2 C)  TempSrc: Oral  Weight: 187 lb 12.8 oz (85.2 kg)  Height: 5\' 3"  (1.6 m)    Body mass index is 33.27 kg/m.   Objective:  Physical Exam Vitals signs and nursing note reviewed.  Constitutional:      Appearance: Normal appearance.  HENT:     Head: Normocephalic and atraumatic.  Cardiovascular:     Rate and Rhythm: Normal rate and regular rhythm.     Heart sounds: Normal heart sounds.  Pulmonary:     Effort: Pulmonary effort is normal.     Breath sounds: Normal breath sounds.  Skin:    General: Skin is warm.  Neurological:     General: No focal deficit present.     Mental Status: She is alert.  Psychiatric:        Mood and Affect: Mood normal.  Behavior: Behavior normal.         Assessment And Plan:     1. Essential (primary) hypertension  Well controlled. She will continue with current meds. She is encouraged to avoid adding salt to her foods.   2. Abnormal Pap smear of vagina  She was due for pap smear in June 2019. She does not wish to have pelvic exam today. I will refer her to GYN ONC for further evaluation. Unfortunately, her GYN recently retired.   - Ambulatory referral to Gynecologic Oncology  3. 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 Most recent office notes reviewed, she was due in Dec 2019 for her evaluation.   - Ambulatory referral to Gynecologic Oncology  4. Class 1 obesity due to excess calories with serious comorbidity and body mass index (BMI) of 33.0 to 33.9 in adult  Importance of achieving optimal weight to decrease risk of cardiovascular disease and cancers was discussed with the patient in full detail. She is encouraged to start slowly - start with 10 minutes twice daily at least three to four days per week and to gradually build to 30 minutes five days weekly. She was given tips to incorporate more activity into her daily routine - take stairs when possible, park farther away from her job, grocery stores, etc.     Gwynneth Aliment, MD

## 2018-03-21 ENCOUNTER — Other Ambulatory Visit: Payer: Self-pay

## 2018-03-21 ENCOUNTER — Inpatient Hospital Stay: Payer: Medicare Other

## 2018-03-21 ENCOUNTER — Encounter: Payer: Self-pay | Admitting: *Deleted

## 2018-03-21 VITALS — BP 126/60 | HR 70 | Temp 98.0°F

## 2018-03-21 DIAGNOSIS — C9001 Multiple myeloma in remission: Secondary | ICD-10-CM

## 2018-03-21 DIAGNOSIS — Z79811 Long term (current) use of aromatase inhibitors: Secondary | ICD-10-CM | POA: Diagnosis not present

## 2018-03-21 DIAGNOSIS — Z5112 Encounter for antineoplastic immunotherapy: Secondary | ICD-10-CM | POA: Diagnosis not present

## 2018-03-21 DIAGNOSIS — C9000 Multiple myeloma not having achieved remission: Secondary | ICD-10-CM | POA: Diagnosis not present

## 2018-03-21 DIAGNOSIS — D509 Iron deficiency anemia, unspecified: Secondary | ICD-10-CM | POA: Diagnosis not present

## 2018-03-21 DIAGNOSIS — R319 Hematuria, unspecified: Secondary | ICD-10-CM | POA: Diagnosis not present

## 2018-03-21 DIAGNOSIS — C541 Malignant neoplasm of endometrium: Secondary | ICD-10-CM | POA: Diagnosis not present

## 2018-03-21 LAB — CMP (CANCER CENTER ONLY)
ALT: 12 U/L (ref 0–44)
AST: 8 U/L — ABNORMAL LOW (ref 15–41)
Albumin: 3.9 g/dL (ref 3.5–5.0)
Alkaline Phosphatase: 40 U/L (ref 38–126)
Anion gap: 9 (ref 5–15)
BUN: 17 mg/dL (ref 8–23)
CO2: 26 mmol/L (ref 22–32)
Calcium: 9 mg/dL (ref 8.9–10.3)
Chloride: 105 mmol/L (ref 98–111)
Creatinine: 0.94 mg/dL (ref 0.44–1.00)
GFR, Est AFR Am: >60 mL/min (ref >60)
GFR, Estimated: >60 mL/min (ref >60)
Glucose, Bld: 104 mg/dL — ABNORMAL HIGH (ref 70–99)
Potassium: 3.6 mmol/L (ref 3.5–5.1)
Sodium: 140 mmol/L (ref 135–145)
Total Bilirubin: 0.3 mg/dL (ref 0.3–1.2)
Total Protein: 5.7 g/dL — ABNORMAL LOW (ref 6.5–8.1)

## 2018-03-21 LAB — CBC WITH DIFFERENTIAL (CANCER CENTER ONLY)
Abs Immature Granulocytes: 0.01 10*3/uL (ref 0.00–0.07)
Basophils Absolute: 0 10*3/uL (ref 0.0–0.1)
Basophils Relative: 1 %
Eosinophils Absolute: 0.1 10*3/uL (ref 0.0–0.5)
Eosinophils Relative: 5 %
HCT: 32.4 % — ABNORMAL LOW (ref 36.0–46.0)
Hemoglobin: 10.5 g/dL — ABNORMAL LOW (ref 12.0–15.0)
Immature Granulocytes: 0 %
Lymphocytes Relative: 13 %
Lymphs Abs: 0.3 10*3/uL — ABNORMAL LOW (ref 0.7–4.0)
MCH: 27.2 pg (ref 26.0–34.0)
MCHC: 32.4 g/dL (ref 30.0–36.0)
MCV: 83.9 fL (ref 80.0–100.0)
Monocytes Absolute: 0.3 10*3/uL (ref 0.1–1.0)
Monocytes Relative: 13 %
Neutro Abs: 1.7 10*3/uL (ref 1.7–7.7)
Neutrophils Relative %: 68 %
Platelet Count: 188 10*3/uL (ref 150–400)
RBC: 3.86 MIL/uL — ABNORMAL LOW (ref 3.87–5.11)
RDW: 15.7 % — ABNORMAL HIGH (ref 11.5–15.5)
WBC Count: 2.6 10*3/uL — ABNORMAL LOW (ref 4.0–10.5)
nRBC: 0 % (ref 0.0–0.2)

## 2018-03-21 MED ORDER — DIPHENHYDRAMINE HCL 25 MG PO CAPS
ORAL_CAPSULE | ORAL | Status: AC
  Filled 2018-03-21: qty 2

## 2018-03-21 MED ORDER — ACETAMINOPHEN 325 MG PO TABS
ORAL_TABLET | ORAL | Status: AC
  Filled 2018-03-21: qty 2

## 2018-03-21 MED ORDER — HEPARIN SOD (PORK) LOCK FLUSH 100 UNIT/ML IV SOLN
500.0000 [IU] | Freq: Once | INTRAVENOUS | Status: AC | PRN
  Administered 2018-03-21: 500 [IU]
  Filled 2018-03-21: qty 5

## 2018-03-21 MED ORDER — SODIUM CHLORIDE 0.9 % IV SOLN
16.5000 mg/kg | Freq: Once | INTRAVENOUS | Status: AC
  Administered 2018-03-21: 1400 mg via INTRAVENOUS
  Filled 2018-03-21: qty 60

## 2018-03-21 MED ORDER — SODIUM CHLORIDE 0.9% FLUSH
10.0000 mL | INTRAVENOUS | Status: DC | PRN
  Administered 2018-03-21: 10 mL
  Filled 2018-03-21: qty 10

## 2018-03-21 MED ORDER — METHYLPREDNISOLONE SODIUM SUCC 125 MG IJ SOLR
INTRAMUSCULAR | Status: AC
  Filled 2018-03-21: qty 2

## 2018-03-21 MED ORDER — DIPHENHYDRAMINE HCL 25 MG PO CAPS
50.0000 mg | ORAL_CAPSULE | Freq: Once | ORAL | Status: AC
  Administered 2018-03-21: 50 mg via ORAL

## 2018-03-21 MED ORDER — METHYLPREDNISOLONE SODIUM SUCC 125 MG IJ SOLR
100.0000 mg | Freq: Once | INTRAMUSCULAR | Status: AC
  Administered 2018-03-21: 100 mg via INTRAVENOUS

## 2018-03-21 MED ORDER — SODIUM CHLORIDE 0.9 % IV SOLN
Freq: Once | INTRAVENOUS | Status: AC
  Administered 2018-03-21: 10:00:00 via INTRAVENOUS
  Filled 2018-03-21: qty 250

## 2018-03-21 MED ORDER — ACETAMINOPHEN 325 MG PO TABS
650.0000 mg | ORAL_TABLET | Freq: Once | ORAL | Status: AC
  Administered 2018-03-21: 650 mg via ORAL

## 2018-03-21 NOTE — Patient Instructions (Signed)
Implanted Port Insertion, Care After  This sheet gives you information about how to care for yourself after your procedure. Your health care provider may also give you more specific instructions. If you have problems or questions, contact your health care provider.  What can I expect after the procedure?  After the procedure, it is common to have:  · Discomfort at the port insertion site.  · Bruising on the skin over the port. This should improve over 3-4 days.  Follow these instructions at home:  Port care  · After your port is placed, you will get a manufacturer's information card. The card has information about your port. Keep this card with you at all times.  · Take care of the port as told by your health care provider. Ask your health care provider if you or a family member can get training for taking care of the port at home. A home health care nurse may also take care of the port.  · Make sure to remember what type of port you have.  Incision care         · Follow instructions from your health care provider about how to take care of your port insertion site. Make sure you:  ? Wash your hands with soap and water before and after you change your bandage (dressing). If soap and water are not available, use hand sanitizer.  ? Change your dressing as told by your health care provider.  ? Leave stitches (sutures), skin glue, or adhesive strips in place. These skin closures may need to stay in place for 2 weeks or longer. If adhesive strip edges start to loosen and curl up, you may trim the loose edges. Do not remove adhesive strips completely unless your health care provider tells you to do that.  · Check your port insertion site every day for signs of infection. Check for:  ? Redness, swelling, or pain.  ? Fluid or blood.  ? Warmth.  ? Pus or a bad smell.  Activity  · Return to your normal activities as told by your health care provider. Ask your health care provider what activities are safe for you.  · Do not  lift anything that is heavier than 10 lb (4.5 kg), or the limit that you are told, until your health care provider says that it is safe.  General instructions  · Take over-the-counter and prescription medicines only as told by your health care provider.  · Do not take baths, swim, or use a hot tub until your health care provider approves. Ask your health care provider if you may take showers. You may only be allowed to take sponge baths.  · Do not drive for 24 hours if you were given a sedative during your procedure.  · Wear a medical alert bracelet in case of an emergency. This will tell any health care providers that you have a port.  · Keep all follow-up visits as told by your health care provider. This is important.  Contact a health care provider if:  · You cannot flush your port with saline as directed, or you cannot draw blood from the port.  · You have a fever or chills.  · You have redness, swelling, or pain around your port insertion site.  · You have fluid or blood coming from your port insertion site.  · Your port insertion site feels warm to the touch.  · You have pus or a bad smell coming from the port   insertion site.  Get help right away if:  · You have chest pain or shortness of breath.  · You have bleeding from your port that you cannot control.  Summary  · Take care of the port as told by your health care provider. Keep the manufacturer's information card with you at all times.  · Change your dressing as told by your health care provider.  · Contact a health care provider if you have a fever or chills or if you have redness, swelling, or pain around your port insertion site.  · Keep all follow-up visits as told by your health care provider.  This information is not intended to replace advice given to you by your health care provider. Make sure you discuss any questions you have with your health care provider.  Document Released: 11/06/2012 Document Revised: 08/14/2017 Document Reviewed:  08/14/2017  Elsevier Interactive Patient Education © 2019 [employer]

## 2018-03-21 NOTE — Patient Instructions (Signed)
Daratumumab injection What is this medicine? DARATUMUMAB (dar a toom ue mab) is a monoclonal antibody. It is used to treat multiple myeloma. This medicine may be used for other purposes; ask your health care provider or pharmacist if you have questions. COMMON BRAND NAME(S): DARZALEX What should I tell my health care provider before I take this medicine? They need to know if you have any of these conditions: -infection (especially a virus infection such as chickenpox, herpes, or hepatitis B virus) -lung or breathing disease -an unusual or allergic reaction to daratumumab, other medicines, foods, dyes, or preservatives -pregnant or trying to get pregnant -breast-feeding How should I use this medicine? This medicine is for infusion into a vein. It is given by a health care professional in a hospital or clinic setting. Talk to your pediatrician regarding the use of this medicine in children. Special care may be needed. Overdosage: If you think you have taken too much of this medicine contact a poison control center or emergency room at once. NOTE: This medicine is only for you. Do not share this medicine with others. What if I miss a dose? Keep appointments for follow-up doses as directed. It is important not to miss your dose. Call your doctor or health care professional if you are unable to keep an appointment. What may interact with this medicine? Interactions have not been studied. Give your health care provider a list of all the medicines, herbs, non-prescription drugs, or dietary supplements you use. Also tell them if you smoke, drink alcohol, or use illegal drugs. Some items may interact with your medicine. This list may not describe all possible interactions. Give your health care provider a list of all the medicines, herbs, non-prescription drugs, or dietary supplements you use. Also tell them if you smoke, drink alcohol, or use illegal drugs. Some items may interact with your  medicine. What should I watch for while using this medicine? This drug may make you feel generally unwell. Report any side effects. Continue your course of treatment even though you feel ill unless your doctor tells you to stop. This medicine can cause serious allergic reactions. To reduce your risk you may need to take medicine before treatment with this medicine. Take your medicine as directed. This medicine can affect the results of blood tests to match your blood type. These changes can last for up to 6 months after the final dose. Your healthcare provider will do blood tests to match your blood type before you start treatment. Tell all of your healthcare providers that you are being treated with this medicine before receiving a blood transfusion. This medicine can affect the results of some tests used to determine treatment response; extra tests may be needed to evaluate response. Do not become pregnant while taking this medicine or for 3 months after stopping it. Women should inform their doctor if they wish to become pregnant or think they might be pregnant. There is a potential for serious side effects to an unborn child. Talk to your health care professional or pharmacist for more information. What side effects may I notice from receiving this medicine? Side effects that you should report to your doctor or health care professional as soon as possible: -allergic reactions like skin rash, itching or hives, swelling of the face, lips, or tongue -breathing problems -chills -cough -dizziness -feeling faint or lightheaded -headache -low blood counts - this medicine may decrease the number of white blood cells, red blood cells and platelets. You may be at increased   risk for infections and bleeding. -nausea, vomiting -shortness of breath -signs of decreased platelets or bleeding - bruising, pinpoint red spots on the skin, black, tarry stools, blood in the urine -signs of decreased red blood  cells - unusually weak or tired, feeling faint or lightheaded, falls -signs of infection - fever or chills, cough, sore throat, pain or difficulty passing urine -signs and symptoms of liver injury like dark yellow or brown urine; general ill feeling or flu-like symptoms; light-colored stools; loss of appetite; right upper belly pain; unusually weak or tired; yellowing of the eyes or skin Side effects that usually do not require medical attention (report to your doctor or health care professional if they continue or are bothersome): -back pain -constipation -loss of appetite -diarrhea -joint pain -muscle cramps -pain, tingling, numbness in the hands or feet -swelling of the ankles, feet, hands -tiredness -trouble sleeping This list may not describe all possible side effects. Call your doctor for medical advice about side effects. You may report side effects to FDA at 1-800-FDA-1088. Where should I keep my medicine? Keep out of the reach of children. This drug is given in a hospital or clinic and will not be stored at home. NOTE: This sheet is a summary. It may not cover all possible information. If you have questions about this medicine, talk to your doctor, pharmacist, or health care provider.  2019 Elsevier/Gold Standard (2017-08-17 15:52:44)

## 2018-03-26 ENCOUNTER — Other Ambulatory Visit: Payer: Self-pay | Admitting: *Deleted

## 2018-03-26 DIAGNOSIS — C9000 Multiple myeloma not having achieved remission: Secondary | ICD-10-CM

## 2018-03-26 MED ORDER — POMALIDOMIDE 3 MG PO CAPS: ORAL_CAPSULE | ORAL | 4 refills | Status: DC

## 2018-03-27 ENCOUNTER — Other Ambulatory Visit: Payer: Self-pay | Admitting: *Deleted

## 2018-03-27 ENCOUNTER — Other Ambulatory Visit: Payer: Self-pay | Admitting: Hematology & Oncology

## 2018-03-27 DIAGNOSIS — C9000 Multiple myeloma not having achieved remission: Secondary | ICD-10-CM

## 2018-03-27 DIAGNOSIS — C9001 Multiple myeloma in remission: Secondary | ICD-10-CM

## 2018-03-28 ENCOUNTER — Inpatient Hospital Stay: Payer: Medicare Other

## 2018-03-28 ENCOUNTER — Other Ambulatory Visit: Payer: Self-pay

## 2018-03-28 VITALS — BP 135/64 | HR 62 | Temp 97.8°F | Resp 20

## 2018-03-28 DIAGNOSIS — C9000 Multiple myeloma not having achieved remission: Secondary | ICD-10-CM | POA: Diagnosis not present

## 2018-03-28 DIAGNOSIS — Z95828 Presence of other vascular implants and grafts: Secondary | ICD-10-CM

## 2018-03-28 DIAGNOSIS — C9001 Multiple myeloma in remission: Secondary | ICD-10-CM

## 2018-03-28 DIAGNOSIS — Z5112 Encounter for antineoplastic immunotherapy: Secondary | ICD-10-CM | POA: Diagnosis not present

## 2018-03-28 DIAGNOSIS — D509 Iron deficiency anemia, unspecified: Secondary | ICD-10-CM | POA: Diagnosis not present

## 2018-03-28 DIAGNOSIS — C541 Malignant neoplasm of endometrium: Secondary | ICD-10-CM | POA: Diagnosis not present

## 2018-03-28 DIAGNOSIS — Z79811 Long term (current) use of aromatase inhibitors: Secondary | ICD-10-CM | POA: Diagnosis not present

## 2018-03-28 DIAGNOSIS — R319 Hematuria, unspecified: Secondary | ICD-10-CM | POA: Diagnosis not present

## 2018-03-28 LAB — CMP (CANCER CENTER ONLY)
ALT: 15 U/L (ref 0–44)
AST: 9 U/L — ABNORMAL LOW (ref 15–41)
Albumin: 4.1 g/dL (ref 3.5–5.0)
Alkaline Phosphatase: 41 U/L (ref 38–126)
Anion gap: 8 (ref 5–15)
BUN: 21 mg/dL (ref 8–23)
CO2: 26 mmol/L (ref 22–32)
Calcium: 9.2 mg/dL (ref 8.9–10.3)
Chloride: 106 mmol/L (ref 98–111)
Creatinine: 0.9 mg/dL (ref 0.44–1.00)
GFR, Est AFR Am: >60 mL/min (ref >60)
GFR, Estimated: >60 mL/min (ref >60)
Glucose, Bld: 95 mg/dL (ref 70–99)
Potassium: 4 mmol/L (ref 3.5–5.1)
Sodium: 140 mmol/L (ref 135–145)
Total Bilirubin: 0.3 mg/dL (ref 0.3–1.2)
Total Protein: 6.2 g/dL — ABNORMAL LOW (ref 6.5–8.1)

## 2018-03-28 LAB — CBC WITH DIFFERENTIAL (CANCER CENTER ONLY)
Abs Immature Granulocytes: 0.03 10*3/uL (ref 0.00–0.07)
Basophils Absolute: 0 10*3/uL (ref 0.0–0.1)
Basophils Relative: 2 %
Eosinophils Absolute: 0.1 10*3/uL (ref 0.0–0.5)
Eosinophils Relative: 6 %
HCT: 33.1 % — ABNORMAL LOW (ref 36.0–46.0)
Hemoglobin: 10.8 g/dL — ABNORMAL LOW (ref 12.0–15.0)
Immature Granulocytes: 1 %
Lymphocytes Relative: 14 %
Lymphs Abs: 0.3 10*3/uL — ABNORMAL LOW (ref 0.7–4.0)
MCH: 27.1 pg (ref 26.0–34.0)
MCHC: 32.6 g/dL (ref 30.0–36.0)
MCV: 83.2 fL (ref 80.0–100.0)
Monocytes Absolute: 0.4 10*3/uL (ref 0.1–1.0)
Monocytes Relative: 19 %
Neutro Abs: 1.3 10*3/uL — ABNORMAL LOW (ref 1.7–7.7)
Neutrophils Relative %: 58 %
Platelet Count: 200 10*3/uL (ref 150–400)
RBC: 3.98 MIL/uL (ref 3.87–5.11)
RDW: 16 % — ABNORMAL HIGH (ref 11.5–15.5)
WBC Count: 2.3 10*3/uL — ABNORMAL LOW (ref 4.0–10.5)
nRBC: 0 % (ref 0.0–0.2)

## 2018-03-28 MED ORDER — SODIUM CHLORIDE 0.9% FLUSH
10.0000 mL | INTRAVENOUS | Status: DC | PRN
  Administered 2018-03-28: 10 mL
  Filled 2018-03-28: qty 10

## 2018-03-28 MED ORDER — SODIUM CHLORIDE 0.9 % IV SOLN
16.5000 mg/kg | Freq: Once | INTRAVENOUS | Status: AC
  Administered 2018-03-28: 1400 mg via INTRAVENOUS
  Filled 2018-03-28: qty 60

## 2018-03-28 MED ORDER — DIPHENHYDRAMINE HCL 25 MG PO CAPS
ORAL_CAPSULE | ORAL | Status: AC
  Filled 2018-03-28: qty 2

## 2018-03-28 MED ORDER — DIPHENHYDRAMINE HCL 25 MG PO CAPS
50.0000 mg | ORAL_CAPSULE | Freq: Once | ORAL | Status: AC
  Administered 2018-03-28: 50 mg via ORAL

## 2018-03-28 MED ORDER — SODIUM CHLORIDE 0.9 % IV SOLN
Freq: Once | INTRAVENOUS | Status: AC
  Administered 2018-03-28: 09:00:00 via INTRAVENOUS
  Filled 2018-03-28: qty 250

## 2018-03-28 MED ORDER — METHYLPREDNISOLONE SODIUM SUCC 125 MG IJ SOLR
100.0000 mg | Freq: Once | INTRAMUSCULAR | Status: AC
  Administered 2018-03-28: 100 mg via INTRAVENOUS

## 2018-03-28 MED ORDER — ACETAMINOPHEN 325 MG PO TABS
650.0000 mg | ORAL_TABLET | Freq: Once | ORAL | Status: AC
  Administered 2018-03-28: 650 mg via ORAL

## 2018-03-28 MED ORDER — ACETAMINOPHEN 325 MG PO TABS
ORAL_TABLET | ORAL | Status: AC
  Filled 2018-03-28: qty 2

## 2018-03-28 MED ORDER — HEPARIN SOD (PORK) LOCK FLUSH 100 UNIT/ML IV SOLN
500.0000 [IU] | Freq: Once | INTRAVENOUS | Status: AC | PRN
  Administered 2018-03-28: 500 [IU]
  Filled 2018-03-28: qty 5

## 2018-03-28 MED ORDER — METHYLPREDNISOLONE SODIUM SUCC 125 MG IJ SOLR
INTRAMUSCULAR | Status: AC
  Filled 2018-03-28: qty 2

## 2018-03-28 MED ORDER — SODIUM CHLORIDE 0.9 % IV SOLN: 16.0000 mg/kg | Freq: Once | INTRAVENOUS | Status: DC

## 2018-03-28 NOTE — Progress Notes (Signed)
9:20 AM Ok to treat with abnormal labs per Dr. Marin Olp.

## 2018-03-28 NOTE — Patient Instructions (Signed)
Daratumumab injection What is this medicine? DARATUMUMAB (dar a toom ue mab) is a monoclonal antibody. It is used to treat multiple myeloma. This medicine may be used for other purposes; ask your health care provider or pharmacist if you have questions. COMMON BRAND NAME(S): DARZALEX What should I tell my health care provider before I take this medicine? They need to know if you have any of these conditions: -infection (especially a virus infection such as chickenpox, herpes, or hepatitis B virus) -lung or breathing disease -an unusual or allergic reaction to daratumumab, other medicines, foods, dyes, or preservatives -pregnant or trying to get pregnant -breast-feeding How should I use this medicine? This medicine is for infusion into a vein. It is given by a health care professional in a hospital or clinic setting. Talk to your pediatrician regarding the use of this medicine in children. Special care may be needed. Overdosage: If you think you have taken too much of this medicine contact a poison control center or emergency room at once. NOTE: This medicine is only for you. Do not share this medicine with others. What if I miss a dose? Keep appointments for follow-up doses as directed. It is important not to miss your dose. Call your doctor or health care professional if you are unable to keep an appointment. What may interact with this medicine? Interactions have not been studied. Give your health care provider a list of all the medicines, herbs, non-prescription drugs, or dietary supplements you use. Also tell them if you smoke, drink alcohol, or use illegal drugs. Some items may interact with your medicine. This list may not describe all possible interactions. Give your health care provider a list of all the medicines, herbs, non-prescription drugs, or dietary supplements you use. Also tell them if you smoke, drink alcohol, or use illegal drugs. Some items may interact with your  medicine. What should I watch for while using this medicine? This drug may make you feel generally unwell. Report any side effects. Continue your course of treatment even though you feel ill unless your doctor tells you to stop. This medicine can cause serious allergic reactions. To reduce your risk you may need to take medicine before treatment with this medicine. Take your medicine as directed. This medicine can affect the results of blood tests to match your blood type. These changes can last for up to 6 months after the final dose. Your healthcare provider will do blood tests to match your blood type before you start treatment. Tell all of your healthcare providers that you are being treated with this medicine before receiving a blood transfusion. This medicine can affect the results of some tests used to determine treatment response; extra tests may be needed to evaluate response. Do not become pregnant while taking this medicine or for 3 months after stopping it. Women should inform their doctor if they wish to become pregnant or think they might be pregnant. There is a potential for serious side effects to an unborn child. Talk to your health care professional or pharmacist for more information. What side effects may I notice from receiving this medicine? Side effects that you should report to your doctor or health care professional as soon as possible: -allergic reactions like skin rash, itching or hives, swelling of the face, lips, or tongue -breathing problems -chills -cough -dizziness -feeling faint or lightheaded -headache -low blood counts - this medicine may decrease the number of white blood cells, red blood cells and platelets. You may be at increased   risk for infections and bleeding. -nausea, vomiting -shortness of breath -signs of decreased platelets or bleeding - bruising, pinpoint red spots on the skin, black, tarry stools, blood in the urine -signs of decreased red blood  cells - unusually weak or tired, feeling faint or lightheaded, falls -signs of infection - fever or chills, cough, sore throat, pain or difficulty passing urine -signs and symptoms of liver injury like dark yellow or brown urine; general ill feeling or flu-like symptoms; light-colored stools; loss of appetite; right upper belly pain; unusually weak or tired; yellowing of the eyes or skin Side effects that usually do not require medical attention (report to your doctor or health care professional if they continue or are bothersome): -back pain -constipation -loss of appetite -diarrhea -joint pain -muscle cramps -pain, tingling, numbness in the hands or feet -swelling of the ankles, feet, hands -tiredness -trouble sleeping This list may not describe all possible side effects. Call your doctor for medical advice about side effects. You may report side effects to FDA at 1-800-FDA-1088. Where should I keep my medicine? Keep out of the reach of children. This drug is given in a hospital or clinic and will not be stored at home. NOTE: This sheet is a summary. It may not cover all possible information. If you have questions about this medicine, talk to your doctor, pharmacist, or health care provider.  2019 Elsevier/Gold Standard (2017-08-17 15:52:44)

## 2018-03-28 NOTE — Patient Instructions (Signed)

## 2018-04-03 ENCOUNTER — Telehealth: Payer: Self-pay | Admitting: *Deleted

## 2018-04-03 ENCOUNTER — Telehealth: Payer: Self-pay | Admitting: Hematology & Oncology

## 2018-04-03 NOTE — Telephone Encounter (Signed)
Called and advised patient about appointment added to her schedule/  She was ok with time change per 3/4 staff message

## 2018-04-03 NOTE — Telephone Encounter (Signed)
Message received from patient stating that she has a "fever and a cold" and would like to know if she should cancel appts tomorrow.  Dr. Marin Olp notified and order received for pt to cancel appts tomorrow and keep appts for next week.  Call placed back to patient to notify her of MD orders.  Pt appreciative of call back.

## 2018-04-04 ENCOUNTER — Inpatient Hospital Stay: Payer: Medicare Other | Admitting: Family

## 2018-04-04 ENCOUNTER — Inpatient Hospital Stay: Payer: Medicare Other

## 2018-04-05 ENCOUNTER — Ambulatory Visit (INDEPENDENT_AMBULATORY_CARE_PROVIDER_SITE_OTHER): Payer: Medicare Other | Admitting: Nurse Practitioner

## 2018-04-05 ENCOUNTER — Encounter: Payer: Self-pay | Admitting: Nurse Practitioner

## 2018-04-05 VITALS — BP 136/70 | HR 85 | Temp 98.6°F | Ht 63.0 in | Wt 188.0 lb

## 2018-04-05 DIAGNOSIS — J069 Acute upper respiratory infection, unspecified: Secondary | ICD-10-CM

## 2018-04-05 MED ORDER — BENZONATATE 100 MG PO CAPS: 100.0000 mg | ORAL_CAPSULE | Freq: Four times a day (QID) | ORAL | 1 refills | Status: DC | PRN

## 2018-04-05 NOTE — Progress Notes (Signed)
Subjective:     Patient ID: Emily Greer , female    DOB: 09-13-1950 , 68 y.o.   MRN: [id number]   Chief Complaint  Patient presents with  . Cough    runny nose, some fever, coughing light yellow mucus     HPI  URI   This is a new problem. The current episode started in the past 7 days. The maximum temperature recorded prior to her arrival was 101 - 101.9 F. The fever has been present for 1 to 2 days. Associated symptoms include headaches. Pertinent negatives include no abdominal pain, chest pain, congestion, coughing, ear pain, nausea, rhinorrhea, sinus pain, sneezing, sore throat or wheezing. Associated symptoms comments: Runny nose . Treatments tried: hbp coricidan. The treatment provided mild relief.     Past Medical History:  Diagnosis Date  . Avascular necrosis of femoral head (HCC)    bilateral per CT 07-26-2015  . Endometrial carcinoma [facility]) gyn oncologist-  dr Nelly Rout ([facility])/  radiation oncologist-- dr Roselind Messier   2013 dx  FIGO Stage 1A, Grade 2 endometrioid endometrial cancer s/p TAH w/ BSO and bilateral pelvic node dissection 10-31-2011 ;  recurrence at distal vagina 04/ 2014 s/p  brachytherapy (ended 07-29-2012);  2nd recurrence 12/ 2016  vaginal apex s/p  conformational radiotherapy 03-10-2015 to 04-20-2015  . Family history of adverse reaction to anesthesia    mother ponv  . GERD (gastroesophageal reflux disease)   . H/O stem cell transplant (HCC)    02/ 2000 and second one 06/ 2016  . 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 History of chemotherapy    last chemo 12-26-17  . History of radiation therapy 6/4, 6/11, 6/19, 6/25, 07/29/2012   vagina 30.5 gray in 5 fx, HDR brachytherapy:   last radiation to vagina 03-10-2015 to 04-20-2015  50.4gray  . History of radiation therapy 03/10/15-04/20/15   vagina 50.4 in 28 fractions  . Hypertension   . Lambda light chain myeloma [facility]) oncologist-  dr Myna Hidalgo ([facility])  and  [facility] -- dr Silvestre Mesi gasparetto   dx 07/ 1999 s/p  VAD chemotherapy 11/ 1999,  purged autotransplant 03-21-1998 followed by auto stem cell transplant 03-29-1998;  recurrance w/ second autologous stem cell transplant 07-24-2014;  in Re-mission currently , chemo maintenance therapy  . Osteoporosis 12/18/05   Increased  risk   . PONV (postoperative nausea and vomiting)   . Wears glasses      Family History  Problem Relation Age of Onset  . Colon cancer Mother   . Hypertension Father   . Heart Problems Father      Current Outpatient Medications:  .  albuterol (PROVENTIL HFA;VENTOLIN HFA) 108 (90 Base) MCG/ACT inhaler, Inhale 2 puffs into the lungs every 6 (six) hours as needed for wheezing or shortness of breath. 2 puffs 3 times daily x 5 days then every 6 hours as needed., Disp: 1 Inhaler, Rfl: 3 .  amLODipine (NORVASC) 10 MG tablet, TAKE 1 TABLET BY MOUTH EVERY MORNING, Disp: 90 tablet, Rfl: 0 .  aspirin EC 81 MG tablet, Take 81 mg by mouth 2 (two) times daily. Stopped  By dr Myna Hidalgo until urology procedures complete, Disp: , Rfl:  .  Cholecalciferol (VITAMIN D3) 2000 UNITS TABS, Take 2 tablets by mouth daily. , Disp: , Rfl:  .  dexamethasone (DECADRON) 4 MG tablet, Take 5 pills with food once a week day after chemotherapy (Patient taking differently: Take  20 mg by mouth once a week. Take 20 mg once a week with food.), Disp: 40 tablet, Rfl: 4 .  letrozole (FEMARA) 2.5 MG tablet, Take 1 tablet (2.5 mg total) by mouth daily., Disp: 60 tablet, Rfl: 4 .  loratadine (CLARITIN) 10 MG tablet, Take 10 mg by mouth every morning. Reported on 08/19/2015, Disp: , Rfl:  .  LORazepam (ATIVAN) 0.5 MG tablet, Take 1 tablet (0.5 mg total) by mouth every 6 (six) hours as needed (Nausea or vomiting)., Disp: 30 tablet, Rfl: 0 .  montelukast (SINGULAIR) 10 MG tablet, Take 1 tablet (10 mg total) by mouth at bedtime. (Patient taking differently: Take 10 mg by mouth daily. ), Disp: 30 tablet, Rfl: 1 .   pomalidomide (POMALYST) 3 MG capsule, Take with water on days 1-21. Repeat every 28 days. ZOXW#[id number], Disp: 21 capsule, Rfl: 4 .  potassium chloride SA (K-DUR,KLOR-CON) 20 MEQ tablet, TAKE 1 TABLET(20 MEQ) BY MOUTH TWICE DAILY, Disp: 60 tablet, Rfl: 0 .  traMADol (ULTRAM) 50 MG tablet, Take 1 tablet (50 mg total) by mouth every 6 (six) hours as needed., Disp: 30 tablet, Rfl: 0 .  acyclovir (ZOVIRAX) 400 MG tablet, Take 1 tablet (400 mg total) by mouth 2 (two) times daily., Disp: 60 tablet, Rfl: 11 .  loperamide (IMODIUM) 2 MG capsule, Take by mouth as needed for diarrhea or loose stools. Reported on 08/19/2015, Disp: , Rfl:  .  triamcinolone (NASACORT ALLERGY 24HR) 55 MCG/ACT AERO nasal inhaler, Place 2 sprays into the nose as needed. , Disp: , Rfl:  .  triamterene-hydrochlorothiazide (MAXZIDE-25) 37.5-25 MG tablet, 1 tablet by mouth at bedtime, Disp: 90 tablet, Rfl: 1 No current facility-administered medications for this visit.   Facility-Administered Medications Ordered in Other Visits:  .  sodium chloride flush (NS) 0.9 % injection 10 mL, 10 mL, Intravenous, PRN, [place], Sarah M, NP, 10 mL at 12/26/17 1130   Allergies  Allergen Reactions  . Codeine Nausea Only     Review of Systems  Constitutional: Positive for fever. Negative for appetite change, chills and fatigue.  HENT: Negative for congestion, ear pain, facial swelling, nosebleeds, postnasal drip, rhinorrhea, sinus pressure, sinus pain, sneezing and sore throat.   Respiratory: Negative for cough and wheezing.   Cardiovascular: Negative.  Negative for chest pain, palpitations and leg swelling.  Gastrointestinal: Negative for abdominal pain and nausea.  Neurological: Positive for headaches. Negative for dizziness.     Today's Vitals   04/05/18 1148  BP: 136/70  Pulse: 85  Temp: 98.6 F (37 C)  TempSrc: Oral  SpO2: 94%  Weight: 188 lb (85.3 kg)  Height: 5\' 3"  (1.6 m)   Body mass index is 33.3 kg/m.   Objective:   Physical Exam Vitals signs reviewed.  Constitutional:      Appearance: Normal appearance.  HENT:     Nose: Rhinorrhea present. No nasal tenderness or congestion.     Right Turbinates: Enlarged.     Left Turbinates: Enlarged.     Mouth/Throat:     Mouth: Mucous membranes are moist.  Eyes:     General:        Right eye: No discharge.     Extraocular Movements: Extraocular movements intact.     Conjunctiva/sclera: Conjunctivae normal.     Pupils: Pupils are equal, round, and reactive to light.  Cardiovascular:     Rate and Rhythm: Normal rate and regular rhythm.     Pulses: Normal pulses.     Heart sounds: Normal heart sounds.  No murmur.  Pulmonary:     Effort: Pulmonary effort is normal. No respiratory distress.     Breath sounds: Normal breath sounds.  Neurological:     Mental Status: She is alert.         Assessment And Plan:     1. Viral upper respiratory tract infection  Symptomatic treatment , given sample of nasocort  Tessalon perles for cough  Mild expiratory wheeze to right lower lobe advised to use her albuterol inhaler as needed every 6 hours.   - benzonatate (TESSALON PERLES) 100 MG capsule; Take 1 capsule (100 mg total) by mouth every 6 (six) hours as needed for cough.  Dispense: 30 capsule; Refill: 1      Arnette Felts, FNP

## 2018-04-11 ENCOUNTER — Inpatient Hospital Stay: Payer: Medicare Other

## 2018-04-11 ENCOUNTER — Inpatient Hospital Stay (HOSPITAL_BASED_OUTPATIENT_CLINIC_OR_DEPARTMENT_OTHER): Payer: Medicare Other | Admitting: Hematology & Oncology

## 2018-04-11 ENCOUNTER — Other Ambulatory Visit: Payer: Self-pay

## 2018-04-11 ENCOUNTER — Inpatient Hospital Stay: Payer: Medicare Other | Attending: Hematology & Oncology

## 2018-04-11 VITALS — BP 149/83 | HR 70 | Temp 98.2°F | Resp 20 | Wt 186.5 lb

## 2018-04-11 DIAGNOSIS — C541 Malignant neoplasm of endometrium: Secondary | ICD-10-CM | POA: Insufficient documentation

## 2018-04-11 DIAGNOSIS — Z79811 Long term (current) use of aromatase inhibitors: Secondary | ICD-10-CM | POA: Insufficient documentation

## 2018-04-11 DIAGNOSIS — C9001 Multiple myeloma in remission: Secondary | ICD-10-CM

## 2018-04-11 DIAGNOSIS — I10 Essential (primary) hypertension: Secondary | ICD-10-CM | POA: Insufficient documentation

## 2018-04-11 DIAGNOSIS — Z5112 Encounter for antineoplastic immunotherapy: Secondary | ICD-10-CM | POA: Insufficient documentation

## 2018-04-11 DIAGNOSIS — Z79899 Other long term (current) drug therapy: Secondary | ICD-10-CM | POA: Insufficient documentation

## 2018-04-11 DIAGNOSIS — C9000 Multiple myeloma not having achieved remission: Secondary | ICD-10-CM | POA: Diagnosis not present

## 2018-04-11 DIAGNOSIS — Z7982 Long term (current) use of aspirin: Secondary | ICD-10-CM | POA: Diagnosis not present

## 2018-04-11 DIAGNOSIS — D509 Iron deficiency anemia, unspecified: Secondary | ICD-10-CM

## 2018-04-11 DIAGNOSIS — Z923 Personal history of irradiation: Secondary | ICD-10-CM | POA: Insufficient documentation

## 2018-04-11 DIAGNOSIS — D500 Iron deficiency anemia secondary to blood loss (chronic): Secondary | ICD-10-CM

## 2018-04-11 LAB — CMP (CANCER CENTER ONLY)
ALT: 33 U/L (ref 0–44)
AST: 13 U/L — ABNORMAL LOW (ref 15–41)
Albumin: 4.2 g/dL (ref 3.5–5.0)
Alkaline Phosphatase: 49 U/L (ref 38–126)
Anion gap: 11 (ref 5–15)
BUN: 20 mg/dL (ref 8–23)
CO2: 24 mmol/L (ref 22–32)
Calcium: 9.3 mg/dL (ref 8.9–10.3)
Chloride: 105 mmol/L (ref 98–111)
Creatinine: 0.89 mg/dL (ref 0.44–1.00)
GFR, Est AFR Am: >60 mL/min (ref >60)
GFR, Estimated: >60 mL/min (ref >60)
Glucose, Bld: 121 mg/dL — ABNORMAL HIGH (ref 70–99)
Potassium: 3.2 mmol/L — ABNORMAL LOW (ref 3.5–5.1)
Sodium: 140 mmol/L (ref 135–145)
Total Bilirubin: 0.2 mg/dL — ABNORMAL LOW (ref 0.3–1.2)
Total Protein: 6.4 g/dL — ABNORMAL LOW (ref 6.5–8.1)

## 2018-04-11 LAB — CBC WITH DIFFERENTIAL (CANCER CENTER ONLY)
Abs Immature Granulocytes: 0.07 10*3/uL (ref 0.00–0.07)
Basophils Absolute: 0.1 10*3/uL (ref 0.0–0.1)
Basophils Relative: 3 %
Eosinophils Absolute: 0.2 10*3/uL (ref 0.0–0.5)
Eosinophils Relative: 5 %
HCT: 34.4 % — ABNORMAL LOW (ref 36.0–46.0)
Hemoglobin: 11 g/dL — ABNORMAL LOW (ref 12.0–15.0)
Immature Granulocytes: 2 %
Lymphocytes Relative: 17 %
Lymphs Abs: 0.6 10*3/uL — ABNORMAL LOW (ref 0.7–4.0)
MCH: 26.4 pg (ref 26.0–34.0)
MCHC: 32 g/dL (ref 30.0–36.0)
MCV: 82.7 fL (ref 80.0–100.0)
Monocytes Absolute: 0.3 10*3/uL (ref 0.1–1.0)
Monocytes Relative: 7 %
Neutro Abs: 2.2 10*3/uL (ref 1.7–7.7)
Neutrophils Relative %: 66 %
Platelet Count: 292 10*3/uL (ref 150–400)
RBC: 4.16 MIL/uL (ref 3.87–5.11)
RDW: 17.2 % — ABNORMAL HIGH (ref 11.5–15.5)
WBC Count: 3.4 10*3/uL — ABNORMAL LOW (ref 4.0–10.5)
nRBC: 0 % (ref 0.0–0.2)

## 2018-04-11 LAB — IRON AND TIBC
Iron: 63 ug/dL (ref 41–142)
Saturation Ratios: 18 % — ABNORMAL LOW (ref 21–57)
TIBC: 359 ug/dL (ref 236–444)
UIBC: 295 ug/dL (ref 120–384)

## 2018-04-11 LAB — FERRITIN: Ferritin: 630 ng/mL — ABNORMAL HIGH (ref 11–307)

## 2018-04-11 LAB — LACTATE DEHYDROGENASE: LDH: 198 U/L — ABNORMAL HIGH (ref 98–192)

## 2018-04-11 MED ORDER — ACETAMINOPHEN 325 MG PO TABS
ORAL_TABLET | ORAL | Status: AC
  Filled 2018-04-11: qty 2

## 2018-04-11 MED ORDER — SODIUM CHLORIDE 0.9 % IV SOLN
Freq: Once | INTRAVENOUS | Status: AC
  Administered 2018-04-11: 10:00:00 via INTRAVENOUS
  Filled 2018-04-11: qty 250

## 2018-04-11 MED ORDER — SODIUM CHLORIDE 0.9% FLUSH
10.0000 mL | INTRAVENOUS | Status: DC | PRN
  Administered 2018-04-11: 10 mL
  Filled 2018-04-11: qty 10

## 2018-04-11 MED ORDER — DIPHENHYDRAMINE HCL 25 MG PO CAPS
50.0000 mg | ORAL_CAPSULE | Freq: Once | ORAL | Status: AC
  Administered 2018-04-11: 50 mg via ORAL

## 2018-04-11 MED ORDER — ACETAMINOPHEN 325 MG PO TABS
650.0000 mg | ORAL_TABLET | Freq: Once | ORAL | Status: AC
  Administered 2018-04-11: 650 mg via ORAL

## 2018-04-11 MED ORDER — METHYLPREDNISOLONE SODIUM SUCC 125 MG IJ SOLR
100.0000 mg | Freq: Once | INTRAMUSCULAR | Status: AC
  Administered 2018-04-11: 100 mg via INTRAVENOUS

## 2018-04-11 MED ORDER — HEPARIN SOD (PORK) LOCK FLUSH 100 UNIT/ML IV SOLN
500.0000 [IU] | Freq: Once | INTRAVENOUS | Status: AC | PRN
  Administered 2018-04-11: 500 [IU]
  Filled 2018-04-11: qty 5

## 2018-04-11 MED ORDER — METHYLPREDNISOLONE SODIUM SUCC 125 MG IJ SOLR
INTRAMUSCULAR | Status: AC
  Filled 2018-04-11: qty 2

## 2018-04-11 MED ORDER — DIPHENHYDRAMINE HCL 25 MG PO CAPS
ORAL_CAPSULE | ORAL | Status: AC
  Filled 2018-04-11: qty 2

## 2018-04-11 MED ORDER — SODIUM CHLORIDE 0.9 % IV SOLN: 16.0000 mg/kg | Freq: Once | INTRAVENOUS | Status: DC

## 2018-04-11 MED ORDER — SODIUM CHLORIDE 0.9 % IV SOLN
16.5000 mg/kg | Freq: Once | INTRAVENOUS | Status: AC
  Administered 2018-04-11: 1400 mg via INTRAVENOUS
  Filled 2018-04-11: qty 60

## 2018-04-11 NOTE — Patient Instructions (Signed)
[place] Discharge Instructions for Patients Receiving Chemotherapy  Today you received the following chemotherapy agents:  Darzalex  To help prevent nausea and vomiting after your treatment, we encourage you to take your nausea medication as ordered per MD.    If you develop nausea and vomiting that is not controlled by your nausea medication, call the clinic.   BELOW ARE SYMPTOMS THAT SHOULD BE REPORTED IMMEDIATELY:  *FEVER GREATER THAN 100.5 F  *CHILLS WITH OR WITHOUT FEVER  NAUSEA AND VOMITING THAT IS NOT CONTROLLED WITH YOUR NAUSEA MEDICATION  *UNUSUAL SHORTNESS OF BREATH  *UNUSUAL BRUISING OR BLEEDING  TENDERNESS IN MOUTH AND THROAT WITH OR WITHOUT PRESENCE OF ULCERS  *URINARY PROBLEMS  *BOWEL PROBLEMS  UNUSUAL RASH Items with * indicate a potential emergency and should be followed up as soon as possible.  Feel free to call the clinic should you have any questions or concerns. The clinic phone number is [phone].  Please show the [place] at check-in to the Emergency Department and triage nurse.

## 2018-04-11 NOTE — Patient Instructions (Signed)

## 2018-04-11 NOTE — Progress Notes (Signed)
Hematology and Oncology Follow Up Visit  Emily Greer [id number] [birth year] 68 y.o. 04/11/2018   Principle Diagnosis:  Recurrent lambda light chain myeloma History of recurrent endometrial carcinoma Iron deficiency anemia -blood loss  PastTherapy: Status post second autologous stem cell transplant on 07/24/2014 Maintenance therapy with Pomalidomide/every 2 week Velcade - d/c'ed Xgeva 120 mg subcutaneous every 3months -next dose inNovember 2019 Radiation therapy for endometrial recurrence - completed 04/20/2015  Current Therapy:   Pomalyst/Kyprolis 70mg /m2 IV q 2 weeks - s/p cycle #12 -- held on 12/26/2017 for hematuria Femara 2.5 mg po q day  IV iron as indicated Daratumumab/Pomalyst/ --status post cycle #1    Interim History:  Emily Greer is here today for follow-up.  She is doing quite well.  She tolerated Emily first cycle of daratumumab quite well.  She had no problems with this.  The real issue that she has is that she apparently has a 5000-hour co-pay for the pomalidomide.  I am not sure how this happened.  We are trying to get this worked out for Emily.  She does not want to take Decadron.  I can understand this.  She just has a hard time with Decadron.  I will go ahead and stop the Decadron.  Emily Greer is doing okay.  Emily Greer is back in [place].  I am not sure she will be staying down there but it sounds like she will be coming back up to [place] to live.    She is still taking letrozole for the endometrial cancer..  Overall, Emily performance status is ECOG 1.    Medications:  Allergies as of 04/11/2018      Reactions   Codeine Nausea Only      Medication List       Accurate as of April 11, 2018 10:07 AM. Always use your most recent med list.        acyclovir 400 MG tablet Commonly known as:  ZOVIRAX Take 1 tablet (400 mg total) by mouth 2 (two) times daily.   albuterol 108 (90 Base) MCG/ACT inhaler Commonly known as:   PROVENTIL HFA;VENTOLIN HFA Inhale 2 puffs into the lungs every 6 (six) hours as needed for wheezing or shortness of breath. 2 puffs 3 times daily x 5 days then every 6 hours as needed.   amLODipine 10 MG tablet Commonly known as:  NORVASC TAKE 1 TABLET BY MOUTH EVERY MORNING   aspirin EC 81 MG tablet Take 81 mg by mouth 2 (two) times daily. Stopped  By dr Myna Hidalgo until urology procedures complete   benzonatate 100 MG capsule Commonly known as:  Tessalon Perles Take 1 capsule (100 mg total) by mouth every 6 (six) hours as needed for cough.   dexamethasone 4 MG tablet Commonly known as:  DECADRON Take 5 pills with food once a week day after chemotherapy   letrozole 2.5 MG tablet Commonly known as:  FEMARA Take 1 tablet (2.5 mg total) by mouth daily.   loperamide 2 MG capsule Commonly known as:  IMODIUM Take by mouth as needed for diarrhea or loose stools. Reported on 08/19/2015   loratadine 10 MG tablet Commonly known as:  CLARITIN Take 10 mg by mouth every morning. Reported on 08/19/2015   LORazepam 0.5 MG tablet Commonly known as:  Ativan Take 1 tablet (0.5 mg total) by mouth every 6 (six) hours as needed (Nausea or vomiting).   montelukast 10 MG tablet Commonly known as:  Singulair Take 1 tablet (10 mg  total) by mouth at bedtime.   Nasacort Allergy 24HR 55 MCG/ACT Aero nasal inhaler Generic drug:  triamcinolone Place 2 sprays into the nose as needed.   pomalidomide 3 MG capsule Commonly known as:  POMALYST Take with water on days 1-21. Repeat every 28 days. [id number]   potassium chloride SA 20 MEQ tablet Commonly known as:  K-DUR,KLOR-CON TAKE 1 TABLET(20 MEQ) BY MOUTH TWICE DAILY   traMADol 50 MG tablet Commonly known as:  Ultram Take 1 tablet (50 mg total) by mouth every 6 (six) hours as needed.   triamterene-hydrochlorothiazide 37.5-25 MG tablet Commonly known as:  MAXZIDE-25 1 tablet by mouth at bedtime   Vitamin D3 50 MCG (2000 UT) Tabs Take 2 tablets  by mouth daily.       Allergies:  Allergies  Allergen Reactions  . Codeine Nausea Only    Past Medical History, Surgical history, Social history, and Family History were reviewed and updated.  Review of Systems: Review of Systems  Constitutional: Negative.   HENT: Negative.   Eyes: Negative.   Respiratory: Negative.   Cardiovascular: Negative.   Gastrointestinal: Negative.   Genitourinary: Positive for hematuria.  Musculoskeletal: Negative.   Skin: Negative.   Neurological: Negative.   Endo/Heme/Allergies: Bruises/bleeds easily.  Psychiatric/Behavioral: Negative.    [name] Kitchen   Physical Exam:  vitals were not taken for this visit.   Wt Readings from Last 3 Encounters:  04/05/18 188 lb (85.3 kg)  03/19/18 187 lb 12.8 oz (85.2 kg)  03/14/18 184 lb (83.5 kg)    Emily vital signs that were taken show temperature of 99.3.  Pulse 75.  Blood pressure 135/67.  Weight is 192 pounds.  Physical Exam Vitals signs reviewed.  HENT:     Head: Normocephalic and atraumatic.  Eyes:     Pupils: Pupils are equal, round, and reactive to light.  Neck:     Musculoskeletal: Normal range of motion.  Cardiovascular:     Rate and Rhythm: Normal rate and regular rhythm.     Heart sounds: Normal heart sounds.  Pulmonary:     Effort: Pulmonary effort is normal.     Breath sounds: Normal breath sounds.  Abdominal:     General: Bowel sounds are normal.     Palpations: Abdomen is soft.  Musculoskeletal: Normal range of motion.        General: No tenderness or deformity.  Lymphadenopathy:     Cervical: No cervical adenopathy.  Skin:    General: Skin is warm and dry.     Findings: No erythema or rash.  Neurological:     Mental Status: She is alert and oriented to person, place, and time.  Psychiatric:        Behavior: Behavior normal.        Thought Content: Thought content normal.        Judgment: Judgment normal.      Lab Results  Component Value Date   WBC 3.4 (L) 04/11/2018   HGB  11.0 (L) 04/11/2018   HCT 34.4 (L) 04/11/2018   MCV 82.7 04/11/2018   PLT 292 04/11/2018   Lab Results  Component Value Date   FERRITIN 621 (H) 02/27/2018   IRON 60 02/27/2018   TIBC 300 02/27/2018   UIBC 240 02/27/2018   IRONPCTSAT 20 (L) 02/27/2018   Lab Results  Component Value Date   RETICCTPCT 2.9 (H) 10/17/2017   RBC 4.16 04/11/2018   Lab Results  Component Value Date   KPAFRELGTCHN 5.7 02/27/2018   LAMBDASER  161.0 (H) 02/27/2018   KAPLAMBRATIO 0.04 (L) 02/27/2018   Lab Results  Component Value Date   IGGSERUM 593 (L) 02/27/2018   IGA 48 (L) 02/27/2018   IGMSERUM 7 (L) 02/27/2018   Lab Results  Component Value Date   TOTALPROTELP 6.0 02/27/2018   ALBUMINELP 3.7 02/27/2018   A1GS 0.2 02/27/2018   A2GS 0.9 02/27/2018   BETS 0.9 02/27/2018   BETA2SER 0.4 11/23/2014   GAMS 0.4 02/27/2018   MSPIKE Not Observed 02/27/2018   SPEI Comment 12/26/2017     Chemistry      Component Value Date/Time   NA 140 04/11/2018 0838   NA 141 01/10/2017 1115   NA 140 06/21/2016 0918   K 3.2 (L) 04/11/2018 0838   K 4.0 01/10/2017 1115   K 4.3 06/21/2016 0918   CL 105 04/11/2018 0838   CL 106 01/10/2017 1115   CO2 24 04/11/2018 0838   CO2 27 01/10/2017 1115   CO2 20 (L) 06/21/2016 0918   BUN 20 04/11/2018 0838   BUN 15 01/10/2017 1115   BUN 15.8 06/21/2016 0918   CREATININE 0.89 04/11/2018 0838   CREATININE 1.0 01/10/2017 1115   CREATININE 0.8 06/21/2016 0918      Component Value Date/Time   CALCIUM 9.3 04/11/2018 0838   CALCIUM 9.5 01/10/2017 1115   CALCIUM 9.4 06/21/2016 0918   ALKPHOS 49 04/11/2018 0838   ALKPHOS 40 01/10/2017 1115   ALKPHOS 66 06/21/2016 0918   AST 13 (L) 04/11/2018 0838   AST 17 06/21/2016 0918   ALT 33 04/11/2018 0838   ALT 19 01/10/2017 1115   ALT 37 06/21/2016 0918   BILITOT 0.2 (L) 04/11/2018 0838   BILITOT 0.32 06/21/2016 0918       Impression and Plan: Emily Greer is a very pleasant 68 yo African American female with recurrent  lambda light chain myeloma and also localized recurrent endometrial cancer (followed by gyn onc Dr. Nelly Rout).   Emily second stem cell transplant for the light chain myeloma was in June 2016.   We will continue Emily on the weekly daratumumab.  I will see Emily back in about 4 weeks.  Hopefully, by then, we will see Emily lambda light chain improving.  Josph Macho, MD 3/12/202010:07 AM

## 2018-04-12 LAB — KAPPA/LAMBDA LIGHT CHAINS
Kappa free light chain: 1.1 mg/L — ABNORMAL LOW (ref 3.3–19.4)
Kappa, lambda light chain ratio: 0.02 — ABNORMAL LOW (ref 0.26–1.65)
Lambda free light chains: 65.5 mg/L — ABNORMAL HIGH (ref 5.7–26.3)

## 2018-04-12 LAB — IGG, IGA, IGM
IgA: 16 mg/dL — ABNORMAL LOW (ref 87–352)
IgG (Immunoglobin G), Serum: 452 mg/dL — ABNORMAL LOW (ref 700–1600)
IgM (Immunoglobulin M), Srm: 5 mg/dL — ABNORMAL LOW (ref 26–217)

## 2018-04-15 ENCOUNTER — Other Ambulatory Visit: Payer: Self-pay | Admitting: Hematology & Oncology

## 2018-04-15 ENCOUNTER — Telehealth: Payer: Self-pay | Admitting: *Deleted

## 2018-04-15 DIAGNOSIS — C9000 Multiple myeloma not having achieved remission: Secondary | ICD-10-CM

## 2018-04-15 DIAGNOSIS — C9001 Multiple myeloma in remission: Secondary | ICD-10-CM

## 2018-04-15 DIAGNOSIS — I10 Essential (primary) hypertension: Secondary | ICD-10-CM

## 2018-04-15 LAB — PROTEIN ELECTROPHORESIS, SERUM, WITH REFLEX
A/G Ratio: 1.3 (ref 0.7–1.7)
Albumin ELP: 3.3 g/dL (ref 2.9–4.4)
Alpha-1-Globulin: 0.2 g/dL (ref 0.0–0.4)
Alpha-2-Globulin: 1 g/dL (ref 0.4–1.0)
Beta Globulin: 1 g/dL (ref 0.7–1.3)
Gamma Globulin: 0.3 g/dL — ABNORMAL LOW (ref 0.4–1.8)
Globulin, Total: 2.5 g/dL (ref 2.2–3.9)
M-Spike, %: 0.1 g/dL — ABNORMAL HIGH
SPEP Interpretation: 0
Total Protein ELP: 5.8 g/dL — ABNORMAL LOW (ref 6.0–8.5)

## 2018-04-15 LAB — IMMUNOFIXATION REFLEX, SERUM
IgA: 16 mg/dL — ABNORMAL LOW (ref 87–352)
IgG (Immunoglobin G), Serum: 483 mg/dL — ABNORMAL LOW (ref 700–1600)
IgM (Immunoglobulin M), Srm: 6 mg/dL — ABNORMAL LOW (ref 26–217)

## 2018-04-15 NOTE — Telephone Encounter (Signed)
As noted below by Dr. Marin Olp, I informed the patient that the light chain is down by 65%. She verbalized understanding.

## 2018-04-15 NOTE — Telephone Encounter (Signed)
-----   Message from [name], MD sent at 04/12/2018  5:00 PM EDT ----- Call - the light chain is down by 65% already!!!!  Emily Greer!!  [name]

## 2018-04-17 ENCOUNTER — Other Ambulatory Visit: Payer: Self-pay | Admitting: *Deleted

## 2018-04-17 DIAGNOSIS — C9000 Multiple myeloma not having achieved remission: Secondary | ICD-10-CM

## 2018-04-18 ENCOUNTER — Other Ambulatory Visit: Payer: Self-pay

## 2018-04-18 ENCOUNTER — Inpatient Hospital Stay: Payer: Medicare Other

## 2018-04-18 DIAGNOSIS — Z95828 Presence of other vascular implants and grafts: Secondary | ICD-10-CM

## 2018-04-18 DIAGNOSIS — C541 Malignant neoplasm of endometrium: Secondary | ICD-10-CM | POA: Diagnosis not present

## 2018-04-18 DIAGNOSIS — I10 Essential (primary) hypertension: Secondary | ICD-10-CM | POA: Diagnosis not present

## 2018-04-18 DIAGNOSIS — C9000 Multiple myeloma not having achieved remission: Secondary | ICD-10-CM

## 2018-04-18 DIAGNOSIS — C9001 Multiple myeloma in remission: Secondary | ICD-10-CM

## 2018-04-18 DIAGNOSIS — Z79811 Long term (current) use of aromatase inhibitors: Secondary | ICD-10-CM | POA: Diagnosis not present

## 2018-04-18 DIAGNOSIS — Z5112 Encounter for antineoplastic immunotherapy: Secondary | ICD-10-CM | POA: Diagnosis not present

## 2018-04-18 DIAGNOSIS — D509 Iron deficiency anemia, unspecified: Secondary | ICD-10-CM | POA: Diagnosis not present

## 2018-04-18 LAB — CBC WITH DIFFERENTIAL (CANCER CENTER ONLY)
Abs Immature Granulocytes: 0.03 10*3/uL (ref 0.00–0.07)
Basophils Absolute: 0.1 10*3/uL (ref 0.0–0.1)
Basophils Relative: 2 %
Eosinophils Absolute: 0.3 10*3/uL (ref 0.0–0.5)
Eosinophils Relative: 7 %
HCT: 36.2 % (ref 36.0–46.0)
Hemoglobin: 11.8 g/dL — ABNORMAL LOW (ref 12.0–15.0)
Immature Granulocytes: 1 %
Lymphocytes Relative: 10 %
Lymphs Abs: 0.5 10*3/uL — ABNORMAL LOW (ref 0.7–4.0)
MCH: 26.4 pg (ref 26.0–34.0)
MCHC: 32.6 g/dL (ref 30.0–36.0)
MCV: 81 fL (ref 80.0–100.0)
Monocytes Absolute: 0.5 10*3/uL (ref 0.1–1.0)
Monocytes Relative: 10 %
Neutro Abs: 3.4 10*3/uL (ref 1.7–7.7)
Neutrophils Relative %: 70 %
Platelet Count: 285 10*3/uL (ref 150–400)
RBC: 4.47 MIL/uL (ref 3.87–5.11)
RDW: 17.5 % — ABNORMAL HIGH (ref 11.5–15.5)
WBC Count: 4.8 10*3/uL (ref 4.0–10.5)
nRBC: 0 % (ref 0.0–0.2)

## 2018-04-18 LAB — CMP (CANCER CENTER ONLY)
ALT: 20 U/L (ref 0–44)
AST: 10 U/L — ABNORMAL LOW (ref 15–41)
Albumin: 4.6 g/dL (ref 3.5–5.0)
Alkaline Phosphatase: 44 U/L (ref 38–126)
Anion gap: 9 (ref 5–15)
BUN: 18 mg/dL (ref 8–23)
CO2: 27 mmol/L (ref 22–32)
Calcium: 9.5 mg/dL (ref 8.9–10.3)
Chloride: 102 mmol/L (ref 98–111)
Creatinine: 0.89 mg/dL (ref 0.44–1.00)
GFR, Est AFR Am: >60 mL/min (ref >60)
GFR, Estimated: >60 mL/min (ref >60)
Glucose, Bld: 101 mg/dL — ABNORMAL HIGH (ref 70–99)
Potassium: 3.6 mmol/L (ref 3.5–5.1)
Sodium: 138 mmol/L (ref 135–145)
Total Bilirubin: 0.4 mg/dL (ref 0.3–1.2)
Total Protein: 6.7 g/dL (ref 6.5–8.1)

## 2018-04-18 MED ORDER — SODIUM CHLORIDE 0.9% FLUSH
10.0000 mL | INTRAVENOUS | Status: DC | PRN
  Administered 2018-04-18: 10 mL
  Filled 2018-04-18: qty 10

## 2018-04-18 MED ORDER — DIPHENHYDRAMINE HCL 25 MG PO CAPS
ORAL_CAPSULE | ORAL | Status: AC
  Filled 2018-04-18: qty 2

## 2018-04-18 MED ORDER — DIPHENHYDRAMINE HCL 25 MG PO CAPS
50.0000 mg | ORAL_CAPSULE | Freq: Once | ORAL | Status: AC
  Administered 2018-04-18: 50 mg via ORAL

## 2018-04-18 MED ORDER — ACETAMINOPHEN 325 MG PO TABS
ORAL_TABLET | ORAL | Status: AC
  Filled 2018-04-18: qty 2

## 2018-04-18 MED ORDER — SODIUM CHLORIDE 0.9 % IV SOLN
Freq: Once | INTRAVENOUS | Status: AC
  Administered 2018-04-18: 11:00:00 via INTRAVENOUS
  Filled 2018-04-18: qty 250

## 2018-04-18 MED ORDER — SODIUM CHLORIDE 0.9 % IV SOLN
1400.0000 mg | Freq: Once | INTRAVENOUS | Status: AC
  Administered 2018-04-18: 1400 mg via INTRAVENOUS
  Filled 2018-04-18: qty 60

## 2018-04-18 MED ORDER — HEPARIN SOD (PORK) LOCK FLUSH 100 UNIT/ML IV SOLN
500.0000 [IU] | Freq: Once | INTRAVENOUS | Status: AC | PRN
  Administered 2018-04-18: 500 [IU]
  Filled 2018-04-18: qty 5

## 2018-04-18 MED ORDER — SODIUM CHLORIDE 0.9% FLUSH
10.0000 mL | Freq: Once | INTRAVENOUS | Status: AC
  Administered 2018-04-18: 10 mL via INTRAVENOUS
  Filled 2018-04-18: qty 10

## 2018-04-18 MED ORDER — METHYLPREDNISOLONE SODIUM SUCC 125 MG IJ SOLR
INTRAMUSCULAR | Status: AC
  Filled 2018-04-18: qty 2

## 2018-04-18 MED ORDER — ACETAMINOPHEN 325 MG PO TABS
650.0000 mg | ORAL_TABLET | Freq: Once | ORAL | Status: AC
  Administered 2018-04-18: 650 mg via ORAL

## 2018-04-18 MED ORDER — METHYLPREDNISOLONE SODIUM SUCC 125 MG IJ SOLR
100.0000 mg | Freq: Once | INTRAMUSCULAR | Status: AC
  Administered 2018-04-18: 100 mg via INTRAVENOUS

## 2018-04-18 NOTE — Patient Instructions (Signed)
Daratumumab injection What is this medicine? DARATUMUMAB (dar a toom ue mab) is a monoclonal antibody. It is used to treat multiple myeloma. This medicine may be used for other purposes; ask your health care provider or pharmacist if you have questions. COMMON BRAND NAME(S): DARZALEX What should I tell my health care provider before I take this medicine? They need to know if you have any of these conditions: -infection (especially a virus infection such as chickenpox, herpes, or hepatitis B virus) -lung or breathing disease -an unusual or allergic reaction to daratumumab, other medicines, foods, dyes, or preservatives -pregnant or trying to get pregnant -breast-feeding How should I use this medicine? This medicine is for infusion into a vein. It is given by a health care professional in a hospital or clinic setting. Talk to your pediatrician regarding the use of this medicine in children. Special care may be needed. Overdosage: If you think you have taken too much of this medicine contact a poison control center or emergency room at once. NOTE: This medicine is only for you. Do not share this medicine with others. What if I miss a dose? Keep appointments for follow-up doses as directed. It is important not to miss your dose. Call your doctor or health care professional if you are unable to keep an appointment. What may interact with this medicine? Interactions have not been studied. Give your health care provider a list of all the medicines, herbs, non-prescription drugs, or dietary supplements you use. Also tell them if you smoke, drink alcohol, or use illegal drugs. Some items may interact with your medicine. This list may not describe all possible interactions. Give your health care provider a list of all the medicines, herbs, non-prescription drugs, or dietary supplements you use. Also tell them if you smoke, drink alcohol, or use illegal drugs. Some items may interact with your  medicine. What should I watch for while using this medicine? This drug may make you feel generally unwell. Report any side effects. Continue your course of treatment even though you feel ill unless your doctor tells you to stop. This medicine can cause serious allergic reactions. To reduce your risk you may need to take medicine before treatment with this medicine. Take your medicine as directed. This medicine can affect the results of blood tests to match your blood type. These changes can last for up to 6 months after the final dose. Your healthcare provider will do blood tests to match your blood type before you start treatment. Tell all of your healthcare providers that you are being treated with this medicine before receiving a blood transfusion. This medicine can affect the results of some tests used to determine treatment response; extra tests may be needed to evaluate response. Do not become pregnant while taking this medicine or for 3 months after stopping it. Women should inform their doctor if they wish to become pregnant or think they might be pregnant. There is a potential for serious side effects to an unborn child. Talk to your health care professional or pharmacist for more information. What side effects may I notice from receiving this medicine? Side effects that you should report to your doctor or health care professional as soon as possible: -allergic reactions like skin rash, itching or hives, swelling of the face, lips, or tongue -breathing problems -chills -cough -dizziness -feeling faint or lightheaded -headache -low blood counts - this medicine may decrease the number of white blood cells, red blood cells and platelets. You may be at increased   risk for infections and bleeding. -nausea, vomiting -shortness of breath -signs of decreased platelets or bleeding - bruising, pinpoint red spots on the skin, black, tarry stools, blood in the urine -signs of decreased red blood  cells - unusually weak or tired, feeling faint or lightheaded, falls -signs of infection - fever or chills, cough, sore throat, pain or difficulty passing urine -signs and symptoms of liver injury like dark yellow or brown urine; general ill feeling or flu-like symptoms; light-colored stools; loss of appetite; right upper belly pain; unusually weak or tired; yellowing of the eyes or skin Side effects that usually do not require medical attention (report to your doctor or health care professional if they continue or are bothersome): -back pain -constipation -loss of appetite -diarrhea -joint pain -muscle cramps -pain, tingling, numbness in the hands or feet -swelling of the ankles, feet, hands -tiredness -trouble sleeping This list may not describe all possible side effects. Call your doctor for medical advice about side effects. You may report side effects to FDA at 1-800-FDA-1088. Where should I keep my medicine? Keep out of the reach of children. This drug is given in a hospital or clinic and will not be stored at home. NOTE: This sheet is a summary. It may not cover all possible information. If you have questions about this medicine, talk to your doctor, pharmacist, or health care provider.  2019 Elsevier/Gold Standard (2017-08-17 15:52:44)

## 2018-04-18 NOTE — Patient Instructions (Signed)

## 2018-04-24 ENCOUNTER — Other Ambulatory Visit: Payer: Self-pay | Admitting: *Deleted

## 2018-04-24 DIAGNOSIS — C9001 Multiple myeloma in remission: Secondary | ICD-10-CM

## 2018-04-25 ENCOUNTER — Ambulatory Visit: Payer: BC Managed Care – PPO | Admitting: Hematology & Oncology

## 2018-04-25 ENCOUNTER — Other Ambulatory Visit: Payer: Self-pay

## 2018-04-25 ENCOUNTER — Inpatient Hospital Stay: Payer: Medicare Other

## 2018-04-25 VITALS — BP 132/66 | HR 73 | Temp 98.5°F | Resp 18

## 2018-04-25 DIAGNOSIS — D509 Iron deficiency anemia, unspecified: Secondary | ICD-10-CM | POA: Diagnosis not present

## 2018-04-25 DIAGNOSIS — C541 Malignant neoplasm of endometrium: Secondary | ICD-10-CM | POA: Diagnosis not present

## 2018-04-25 DIAGNOSIS — Z79811 Long term (current) use of aromatase inhibitors: Secondary | ICD-10-CM | POA: Diagnosis not present

## 2018-04-25 DIAGNOSIS — Z5112 Encounter for antineoplastic immunotherapy: Secondary | ICD-10-CM | POA: Diagnosis not present

## 2018-04-25 DIAGNOSIS — C9001 Multiple myeloma in remission: Secondary | ICD-10-CM

## 2018-04-25 DIAGNOSIS — C9000 Multiple myeloma not having achieved remission: Secondary | ICD-10-CM | POA: Diagnosis not present

## 2018-04-25 DIAGNOSIS — I10 Essential (primary) hypertension: Secondary | ICD-10-CM | POA: Diagnosis not present

## 2018-04-25 LAB — CMP (CANCER CENTER ONLY)
ALT: 15 U/L (ref 0–44)
AST: 9 U/L — ABNORMAL LOW (ref 15–41)
Albumin: 4 g/dL (ref 3.5–5.0)
Alkaline Phosphatase: 42 U/L (ref 38–126)
Anion gap: 9 (ref 5–15)
BUN: 12 mg/dL (ref 8–23)
CO2: 25 mmol/L (ref 22–32)
Calcium: 8.8 mg/dL — ABNORMAL LOW (ref 8.9–10.3)
Chloride: 106 mmol/L (ref 98–111)
Creatinine: 0.85 mg/dL (ref 0.44–1.00)
GFR, Est AFR Am: >60 mL/min (ref >60)
GFR, Estimated: >60 mL/min (ref >60)
Glucose, Bld: 108 mg/dL — ABNORMAL HIGH (ref 70–99)
Potassium: 3.7 mmol/L (ref 3.5–5.1)
Sodium: 140 mmol/L (ref 135–145)
Total Bilirubin: 0.4 mg/dL (ref 0.3–1.2)
Total Protein: 5.8 g/dL — ABNORMAL LOW (ref 6.5–8.1)

## 2018-04-25 LAB — CBC WITH DIFFERENTIAL (CANCER CENTER ONLY)
Abs Immature Granulocytes: 0.03 10*3/uL (ref 0.00–0.07)
Basophils Absolute: 0.1 10*3/uL (ref 0.0–0.1)
Basophils Relative: 1 %
Eosinophils Absolute: 0.4 10*3/uL (ref 0.0–0.5)
Eosinophils Relative: 8 %
HCT: 31.9 % — ABNORMAL LOW (ref 36.0–46.0)
Hemoglobin: 10.3 g/dL — ABNORMAL LOW (ref 12.0–15.0)
Immature Granulocytes: 1 %
Lymphocytes Relative: 6 %
Lymphs Abs: 0.3 10*3/uL — ABNORMAL LOW (ref 0.7–4.0)
MCH: 26.5 pg (ref 26.0–34.0)
MCHC: 32.3 g/dL (ref 30.0–36.0)
MCV: 82.2 fL (ref 80.0–100.0)
Monocytes Absolute: 0.7 10*3/uL (ref 0.1–1.0)
Monocytes Relative: 13 %
Neutro Abs: 3.9 10*3/uL (ref 1.7–7.7)
Neutrophils Relative %: 71 %
Platelet Count: 203 10*3/uL (ref 150–400)
RBC: 3.88 MIL/uL (ref 3.87–5.11)
RDW: 18.1 % — ABNORMAL HIGH (ref 11.5–15.5)
WBC Count: 5.3 10*3/uL (ref 4.0–10.5)
nRBC: 0 % (ref 0.0–0.2)

## 2018-04-25 MED ORDER — ACETAMINOPHEN 325 MG PO TABS
ORAL_TABLET | ORAL | Status: AC
  Filled 2018-04-25: qty 2

## 2018-04-25 MED ORDER — SODIUM CHLORIDE 0.9% FLUSH
10.0000 mL | INTRAVENOUS | Status: DC | PRN
  Administered 2018-04-25: 10 mL
  Filled 2018-04-25: qty 10

## 2018-04-25 MED ORDER — METHYLPREDNISOLONE SODIUM SUCC 125 MG IJ SOLR
100.0000 mg | Freq: Once | INTRAMUSCULAR | Status: AC
  Administered 2018-04-25: 100 mg via INTRAVENOUS

## 2018-04-25 MED ORDER — SODIUM CHLORIDE 0.9 % IV SOLN
16.5000 mg/kg | Freq: Once | INTRAVENOUS | Status: AC
  Administered 2018-04-25: 1400 mg via INTRAVENOUS
  Filled 2018-04-25: qty 70

## 2018-04-25 MED ORDER — ACETAMINOPHEN 325 MG PO TABS
650.0000 mg | ORAL_TABLET | Freq: Once | ORAL | Status: AC
  Administered 2018-04-25: 650 mg via ORAL

## 2018-04-25 MED ORDER — DIPHENHYDRAMINE HCL 25 MG PO CAPS
50.0000 mg | ORAL_CAPSULE | Freq: Once | ORAL | Status: AC
  Administered 2018-04-25: 50 mg via ORAL

## 2018-04-25 MED ORDER — HEPARIN SOD (PORK) LOCK FLUSH 100 UNIT/ML IV SOLN
500.0000 [IU] | Freq: Once | INTRAVENOUS | Status: AC | PRN
  Administered 2018-04-25: 500 [IU]
  Filled 2018-04-25: qty 5

## 2018-04-25 MED ORDER — METHYLPREDNISOLONE SODIUM SUCC 125 MG IJ SOLR
INTRAMUSCULAR | Status: AC
  Filled 2018-04-25: qty 2

## 2018-04-25 MED ORDER — SODIUM CHLORIDE 0.9 % IV SOLN
Freq: Once | INTRAVENOUS | Status: AC
  Administered 2018-04-25: 09:00:00 via INTRAVENOUS
  Filled 2018-04-25: qty 250

## 2018-04-25 MED ORDER — DIPHENHYDRAMINE HCL 25 MG PO CAPS
ORAL_CAPSULE | ORAL | Status: AC
  Filled 2018-04-25: qty 2

## 2018-04-25 NOTE — Patient Instructions (Signed)
[facility] Discharge Instructions for Patients Receiving Chemotherapy  Today you received the following chemotherapy agents:  Darzalex  To help prevent nausea and vomiting after your treatment, we encourage you to take your nausea medication as ordered per MD.    If you develop nausea and vomiting that is not controlled by your nausea medication, call the clinic.   BELOW ARE SYMPTOMS THAT SHOULD BE REPORTED IMMEDIATELY:  *FEVER GREATER THAN 100.5 F  *CHILLS WITH OR WITHOUT FEVER  NAUSEA AND VOMITING THAT IS NOT CONTROLLED WITH YOUR NAUSEA MEDICATION  *UNUSUAL SHORTNESS OF BREATH  *UNUSUAL BRUISING OR BLEEDING  TENDERNESS IN MOUTH AND THROAT WITH OR WITHOUT PRESENCE OF ULCERS  *URINARY PROBLEMS  *BOWEL PROBLEMS  UNUSUAL RASH Items with * indicate a potential emergency and should be followed up as soon as possible.  Feel free to call the clinic should you have any questions or concerns. The clinic phone number is [phone].  Please show the [facility] at check-in to the Emergency Department and triage nurse.

## 2018-04-28 ENCOUNTER — Encounter: Payer: Self-pay | Admitting: Nurse Practitioner

## 2018-05-01 ENCOUNTER — Other Ambulatory Visit: Payer: Self-pay | Admitting: *Deleted

## 2018-05-01 DIAGNOSIS — C9001 Multiple myeloma in remission: Secondary | ICD-10-CM

## 2018-05-01 DIAGNOSIS — C9000 Multiple myeloma not having achieved remission: Secondary | ICD-10-CM

## 2018-05-01 MED ORDER — POMALIDOMIDE 3 MG PO CAPS: ORAL_CAPSULE | ORAL | 4 refills | Status: DC

## 2018-05-02 ENCOUNTER — Inpatient Hospital Stay: Payer: Medicare Other | Attending: Hematology & Oncology

## 2018-05-02 ENCOUNTER — Other Ambulatory Visit: Payer: Self-pay

## 2018-05-02 ENCOUNTER — Other Ambulatory Visit: Payer: Self-pay | Admitting: Hematology & Oncology

## 2018-05-02 ENCOUNTER — Inpatient Hospital Stay: Payer: Medicare Other

## 2018-05-02 DIAGNOSIS — Z5112 Encounter for antineoplastic immunotherapy: Secondary | ICD-10-CM | POA: Insufficient documentation

## 2018-05-02 DIAGNOSIS — Z95828 Presence of other vascular implants and grafts: Secondary | ICD-10-CM

## 2018-05-02 DIAGNOSIS — C9000 Multiple myeloma not having achieved remission: Secondary | ICD-10-CM | POA: Diagnosis not present

## 2018-05-02 DIAGNOSIS — C541 Malignant neoplasm of endometrium: Secondary | ICD-10-CM | POA: Diagnosis not present

## 2018-05-02 DIAGNOSIS — Z7951 Long term (current) use of inhaled steroids: Secondary | ICD-10-CM | POA: Insufficient documentation

## 2018-05-02 DIAGNOSIS — C9001 Multiple myeloma in remission: Secondary | ICD-10-CM

## 2018-05-02 DIAGNOSIS — Z79899 Other long term (current) drug therapy: Secondary | ICD-10-CM | POA: Diagnosis not present

## 2018-05-02 DIAGNOSIS — Z79811 Long term (current) use of aromatase inhibitors: Secondary | ICD-10-CM | POA: Diagnosis not present

## 2018-05-02 DIAGNOSIS — Z7982 Long term (current) use of aspirin: Secondary | ICD-10-CM | POA: Diagnosis not present

## 2018-05-02 DIAGNOSIS — D509 Iron deficiency anemia, unspecified: Secondary | ICD-10-CM | POA: Insufficient documentation

## 2018-05-02 DIAGNOSIS — R232 Flushing: Secondary | ICD-10-CM | POA: Insufficient documentation

## 2018-05-02 DIAGNOSIS — R61 Generalized hyperhidrosis: Secondary | ICD-10-CM | POA: Insufficient documentation

## 2018-05-02 LAB — CBC WITH DIFFERENTIAL (CANCER CENTER ONLY)
Abs Immature Granulocytes: 0.03 10*3/uL (ref 0.00–0.07)
Basophils Absolute: 0 10*3/uL (ref 0.0–0.1)
Basophils Relative: 1 %
Eosinophils Absolute: 0.2 10*3/uL (ref 0.0–0.5)
Eosinophils Relative: 6 %
HCT: 32.5 % — ABNORMAL LOW (ref 36.0–46.0)
Hemoglobin: 10.6 g/dL — ABNORMAL LOW (ref 12.0–15.0)
Immature Granulocytes: 1 %
Lymphocytes Relative: 9 %
Lymphs Abs: 0.4 10*3/uL — ABNORMAL LOW (ref 0.7–4.0)
MCH: 26.7 pg (ref 26.0–34.0)
MCHC: 32.6 g/dL (ref 30.0–36.0)
MCV: 81.9 fL (ref 80.0–100.0)
Monocytes Absolute: 0.9 10*3/uL (ref 0.1–1.0)
Monocytes Relative: 20 %
Neutro Abs: 2.8 10*3/uL (ref 1.7–7.7)
Neutrophils Relative %: 63 %
Platelet Count: 190 10*3/uL (ref 150–400)
RBC: 3.97 MIL/uL (ref 3.87–5.11)
RDW: 18.4 % — ABNORMAL HIGH (ref 11.5–15.5)
WBC Count: 4.4 10*3/uL (ref 4.0–10.5)
nRBC: 0 % (ref 0.0–0.2)

## 2018-05-02 LAB — CMP (CANCER CENTER ONLY)
ALT: 14 U/L (ref 0–44)
AST: 11 U/L — ABNORMAL LOW (ref 15–41)
Albumin: 4.2 g/dL (ref 3.5–5.0)
Alkaline Phosphatase: 37 U/L — ABNORMAL LOW (ref 38–126)
Anion gap: 8 (ref 5–15)
BUN: 19 mg/dL (ref 8–23)
CO2: 26 mmol/L (ref 22–32)
Calcium: 8.7 mg/dL — ABNORMAL LOW (ref 8.9–10.3)
Chloride: 106 mmol/L (ref 98–111)
Creatinine: 0.84 mg/dL (ref 0.44–1.00)
GFR, Est AFR Am: >60 mL/min (ref >60)
GFR, Estimated: >60 mL/min (ref >60)
Glucose, Bld: 95 mg/dL (ref 70–99)
Potassium: 3.8 mmol/L (ref 3.5–5.1)
Sodium: 140 mmol/L (ref 135–145)
Total Bilirubin: 0.3 mg/dL (ref 0.3–1.2)
Total Protein: 6.2 g/dL — ABNORMAL LOW (ref 6.5–8.1)

## 2018-05-02 MED ORDER — SODIUM CHLORIDE 0.9 % IV SOLN
Freq: Once | INTRAVENOUS | Status: AC
  Administered 2018-05-02: 11:00:00 via INTRAVENOUS
  Filled 2018-05-02: qty 250

## 2018-05-02 MED ORDER — METHYLPREDNISOLONE SODIUM SUCC 125 MG IJ SOLR
100.0000 mg | Freq: Once | INTRAMUSCULAR | Status: AC
  Administered 2018-05-02: 100 mg via INTRAVENOUS

## 2018-05-02 MED ORDER — DIPHENHYDRAMINE HCL 25 MG PO CAPS
ORAL_CAPSULE | ORAL | Status: AC
  Filled 2018-05-02: qty 2

## 2018-05-02 MED ORDER — SODIUM CHLORIDE 0.9 % IV SOLN
16.5000 mg/kg | Freq: Once | INTRAVENOUS | Status: AC
  Administered 2018-05-02: 1400 mg via INTRAVENOUS
  Filled 2018-05-02: qty 70

## 2018-05-02 MED ORDER — POMALIDOMIDE 1 MG PO CAPS: 1.0000 mg | ORAL_CAPSULE | Freq: Every day | ORAL | 0 refills | Status: DC

## 2018-05-02 MED ORDER — ACETAMINOPHEN 325 MG PO TABS
650.0000 mg | ORAL_TABLET | Freq: Once | ORAL | Status: AC
  Administered 2018-05-02: 650 mg via ORAL

## 2018-05-02 MED ORDER — ACETAMINOPHEN 325 MG PO TABS
ORAL_TABLET | ORAL | Status: AC
  Filled 2018-05-02: qty 2

## 2018-05-02 MED ORDER — SODIUM CHLORIDE 0.9% FLUSH
10.0000 mL | Freq: Once | INTRAVENOUS | Status: AC
  Administered 2018-05-02: 10 mL via INTRAVENOUS
  Filled 2018-05-02: qty 10

## 2018-05-02 MED ORDER — SODIUM CHLORIDE 0.9% FLUSH
10.0000 mL | INTRAVENOUS | Status: DC | PRN
  Administered 2018-05-02: 10 mL
  Filled 2018-05-02: qty 10

## 2018-05-02 MED ORDER — HEPARIN SOD (PORK) LOCK FLUSH 100 UNIT/ML IV SOLN
500.0000 [IU] | Freq: Once | INTRAVENOUS | Status: AC | PRN
  Administered 2018-05-02: 500 [IU]
  Filled 2018-05-02: qty 5

## 2018-05-02 MED ORDER — DIPHENHYDRAMINE HCL 25 MG PO CAPS
50.0000 mg | ORAL_CAPSULE | Freq: Once | ORAL | Status: AC
  Administered 2018-05-02: 50 mg via ORAL

## 2018-05-02 MED ORDER — METHYLPREDNISOLONE SODIUM SUCC 125 MG IJ SOLR
INTRAMUSCULAR | Status: AC
  Filled 2018-05-02: qty 2

## 2018-05-02 NOTE — Patient Instructions (Signed)
Daratumumab injection What is this medicine? DARATUMUMAB (dar a toom ue mab) is a monoclonal antibody. It is used to treat multiple myeloma. This medicine may be used for other purposes; ask your health care provider or pharmacist if you have questions. COMMON BRAND NAME(S): DARZALEX What should I tell my health care provider before I take this medicine? They need to know if you have any of these conditions: -infection (especially a virus infection such as chickenpox, herpes, or hepatitis B virus) -lung or breathing disease -an unusual or allergic reaction to daratumumab, other medicines, foods, dyes, or preservatives -pregnant or trying to get pregnant -breast-feeding How should I use this medicine? This medicine is for infusion into a vein. It is given by a health care professional in a hospital or clinic setting. Talk to your pediatrician regarding the use of this medicine in children. Special care may be needed. Overdosage: If you think you have taken too much of this medicine contact a poison control center or emergency room at once. NOTE: This medicine is only for you. Do not share this medicine with others. What if I miss a dose? Keep appointments for follow-up doses as directed. It is important not to miss your dose. Call your doctor or health care professional if you are unable to keep an appointment. What may interact with this medicine? Interactions have not been studied. Give your health care provider a list of all the medicines, herbs, non-prescription drugs, or dietary supplements you use. Also tell them if you smoke, drink alcohol, or use illegal drugs. Some items may interact with your medicine. This list may not describe all possible interactions. Give your health care provider a list of all the medicines, herbs, non-prescription drugs, or dietary supplements you use. Also tell them if you smoke, drink alcohol, or use illegal drugs. Some items may interact with your  medicine. What should I watch for while using this medicine? This drug may make you feel generally unwell. Report any side effects. Continue your course of treatment even though you feel ill unless your doctor tells you to stop. This medicine can cause serious allergic reactions. To reduce your risk you may need to take medicine before treatment with this medicine. Take your medicine as directed. This medicine can affect the results of blood tests to match your blood type. These changes can last for up to 6 months after the final dose. Your healthcare provider will do blood tests to match your blood type before you start treatment. Tell all of your healthcare providers that you are being treated with this medicine before receiving a blood transfusion. This medicine can affect the results of some tests used to determine treatment response; extra tests may be needed to evaluate response. Do not become pregnant while taking this medicine or for 3 months after stopping it. Women should inform their doctor if they wish to become pregnant or think they might be pregnant. There is a potential for serious side effects to an unborn child. Talk to your health care professional or pharmacist for more information. What side effects may I notice from receiving this medicine? Side effects that you should report to your doctor or health care professional as soon as possible: -allergic reactions like skin rash, itching or hives, swelling of the face, lips, or tongue -breathing problems -chills -cough -dizziness -feeling faint or lightheaded -headache -low blood counts - this medicine may decrease the number of white blood cells, red blood cells and platelets. You may be at increased   risk for infections and bleeding. -nausea, vomiting -shortness of breath -signs of decreased platelets or bleeding - bruising, pinpoint red spots on the skin, black, tarry stools, blood in the urine -signs of decreased red blood  cells - unusually weak or tired, feeling faint or lightheaded, falls -signs of infection - fever or chills, cough, sore throat, pain or difficulty passing urine -signs and symptoms of liver injury like dark yellow or brown urine; general ill feeling or flu-like symptoms; light-colored stools; loss of appetite; right upper belly pain; unusually weak or tired; yellowing of the eyes or skin Side effects that usually do not require medical attention (report to your doctor or health care professional if they continue or are bothersome): -back pain -constipation -loss of appetite -diarrhea -joint pain -muscle cramps -pain, tingling, numbness in the hands or feet -swelling of the ankles, feet, hands -tiredness -trouble sleeping This list may not describe all possible side effects. Call your doctor for medical advice about side effects. You may report side effects to FDA at 1-800-FDA-1088. Where should I keep my medicine? Keep out of the reach of children. This drug is given in a hospital or clinic and will not be stored at home. NOTE: This sheet is a summary. It may not cover all possible information. If you have questions about this medicine, talk to your doctor, pharmacist, or health care provider.  2019 Elsevier/Gold Standard (2017-08-17 15:52:44)

## 2018-05-02 NOTE — Patient Instructions (Signed)

## 2018-05-09 ENCOUNTER — Encounter: Payer: Self-pay | Admitting: Family

## 2018-05-09 ENCOUNTER — Inpatient Hospital Stay (HOSPITAL_BASED_OUTPATIENT_CLINIC_OR_DEPARTMENT_OTHER): Payer: Medicare Other | Admitting: Family

## 2018-05-09 ENCOUNTER — Inpatient Hospital Stay: Payer: Medicare Other

## 2018-05-09 ENCOUNTER — Other Ambulatory Visit: Payer: Self-pay

## 2018-05-09 ENCOUNTER — Telehealth: Payer: Self-pay | Admitting: Hematology & Oncology

## 2018-05-09 VITALS — BP 131/73 | HR 90 | Temp 98.6°F | Resp 16 | Wt 189.5 lb

## 2018-05-09 VITALS — BP 118/68 | HR 83 | Resp 16

## 2018-05-09 DIAGNOSIS — Z7982 Long term (current) use of aspirin: Secondary | ICD-10-CM

## 2018-05-09 DIAGNOSIS — D509 Iron deficiency anemia, unspecified: Secondary | ICD-10-CM

## 2018-05-09 DIAGNOSIS — R61 Generalized hyperhidrosis: Secondary | ICD-10-CM | POA: Diagnosis not present

## 2018-05-09 DIAGNOSIS — R232 Flushing: Secondary | ICD-10-CM

## 2018-05-09 DIAGNOSIS — C9001 Multiple myeloma in remission: Secondary | ICD-10-CM

## 2018-05-09 DIAGNOSIS — C541 Malignant neoplasm of endometrium: Secondary | ICD-10-CM | POA: Diagnosis not present

## 2018-05-09 DIAGNOSIS — C9000 Multiple myeloma not having achieved remission: Secondary | ICD-10-CM | POA: Diagnosis not present

## 2018-05-09 DIAGNOSIS — Z5112 Encounter for antineoplastic immunotherapy: Secondary | ICD-10-CM | POA: Diagnosis not present

## 2018-05-09 DIAGNOSIS — Z79811 Long term (current) use of aromatase inhibitors: Secondary | ICD-10-CM | POA: Diagnosis not present

## 2018-05-09 DIAGNOSIS — Z79899 Other long term (current) drug therapy: Secondary | ICD-10-CM | POA: Diagnosis not present

## 2018-05-09 DIAGNOSIS — Z7951 Long term (current) use of inhaled steroids: Secondary | ICD-10-CM | POA: Diagnosis not present

## 2018-05-09 LAB — CBC WITH DIFFERENTIAL (CANCER CENTER ONLY)
Abs Immature Granulocytes: 0.02 10*3/uL (ref 0.00–0.07)
Basophils Absolute: 0.1 10*3/uL (ref 0.0–0.1)
Basophils Relative: 2 %
Eosinophils Absolute: 0.2 10*3/uL (ref 0.0–0.5)
Eosinophils Relative: 8 %
HCT: 34.3 % — ABNORMAL LOW (ref 36.0–46.0)
Hemoglobin: 11.1 g/dL — ABNORMAL LOW (ref 12.0–15.0)
Immature Granulocytes: 1 %
Lymphocytes Relative: 15 %
Lymphs Abs: 0.5 10*3/uL — ABNORMAL LOW (ref 0.7–4.0)
MCH: 26.4 pg (ref 26.0–34.0)
MCHC: 32.4 g/dL (ref 30.0–36.0)
MCV: 81.5 fL (ref 80.0–100.0)
Monocytes Absolute: 0.5 10*3/uL (ref 0.1–1.0)
Monocytes Relative: 17 %
Neutro Abs: 1.7 10*3/uL (ref 1.7–7.7)
Neutrophils Relative %: 57 %
Platelet Count: 245 10*3/uL (ref 150–400)
RBC: 4.21 MIL/uL (ref 3.87–5.11)
RDW: 18.4 % — ABNORMAL HIGH (ref 11.5–15.5)
WBC Count: 3 10*3/uL — ABNORMAL LOW (ref 4.0–10.5)
nRBC: 0 % (ref 0.0–0.2)

## 2018-05-09 LAB — CMP (CANCER CENTER ONLY)
ALT: 17 U/L (ref 0–44)
AST: 11 U/L — ABNORMAL LOW (ref 15–41)
Albumin: 4.5 g/dL (ref 3.5–5.0)
Alkaline Phosphatase: 48 U/L (ref 38–126)
Anion gap: 11 (ref 5–15)
BUN: 17 mg/dL (ref 8–23)
CO2: 25 mmol/L (ref 22–32)
Calcium: 9.4 mg/dL (ref 8.9–10.3)
Chloride: 102 mmol/L (ref 98–111)
Creatinine: 0.94 mg/dL (ref 0.44–1.00)
GFR, Est AFR Am: >60 mL/min (ref >60)
GFR, Estimated: >60 mL/min (ref >60)
Glucose, Bld: 93 mg/dL (ref 70–99)
Potassium: 3.5 mmol/L (ref 3.5–5.1)
Sodium: 138 mmol/L (ref 135–145)
Total Bilirubin: 0.4 mg/dL (ref 0.3–1.2)
Total Protein: 6.8 g/dL (ref 6.5–8.1)

## 2018-05-09 MED ORDER — ACETAMINOPHEN 325 MG PO TABS
650.0000 mg | ORAL_TABLET | Freq: Once | ORAL | Status: AC
  Administered 2018-05-09: 650 mg via ORAL

## 2018-05-09 MED ORDER — SODIUM CHLORIDE 0.9 % IV SOLN
Freq: Once | INTRAVENOUS | Status: AC
  Administered 2018-05-09: 11:00:00 via INTRAVENOUS
  Filled 2018-05-09: qty 250

## 2018-05-09 MED ORDER — DIPHENHYDRAMINE HCL 25 MG PO CAPS
50.0000 mg | ORAL_CAPSULE | Freq: Once | ORAL | Status: AC
  Administered 2018-05-09: 50 mg via ORAL

## 2018-05-09 MED ORDER — SODIUM CHLORIDE 0.9 % IV SOLN
16.5000 mg/kg | Freq: Once | INTRAVENOUS | Status: AC
  Administered 2018-05-09: 1400 mg via INTRAVENOUS
  Filled 2018-05-09: qty 10

## 2018-05-09 MED ORDER — HEPARIN SOD (PORK) LOCK FLUSH 100 UNIT/ML IV SOLN
500.0000 [IU] | Freq: Once | INTRAVENOUS | Status: AC | PRN
  Administered 2018-05-09: 500 [IU]
  Filled 2018-05-09: qty 5

## 2018-05-09 MED ORDER — SODIUM CHLORIDE 0.9% FLUSH
10.0000 mL | INTRAVENOUS | Status: DC | PRN
  Administered 2018-05-09: 10 mL
  Filled 2018-05-09: qty 10

## 2018-05-09 MED ORDER — DIPHENHYDRAMINE HCL 25 MG PO CAPS
ORAL_CAPSULE | ORAL | Status: AC
  Filled 2018-05-09: qty 2

## 2018-05-09 MED ORDER — ACETAMINOPHEN 325 MG PO TABS
ORAL_TABLET | ORAL | Status: AC
  Filled 2018-05-09: qty 2

## 2018-05-09 MED ORDER — METHYLPREDNISOLONE SODIUM SUCC 125 MG IJ SOLR
INTRAMUSCULAR | Status: AC
  Filled 2018-05-09: qty 2

## 2018-05-09 MED ORDER — METHYLPREDNISOLONE SODIUM SUCC 125 MG IJ SOLR
100.0000 mg | Freq: Once | INTRAMUSCULAR | Status: AC
  Administered 2018-05-09: 100 mg via INTRAVENOUS

## 2018-05-09 NOTE — Patient Instructions (Signed)
Daratumumab injection What is this medicine? DARATUMUMAB (dar a toom ue mab) is a monoclonal antibody. It is used to treat multiple myeloma. This medicine may be used for other purposes; ask your health care provider or pharmacist if you have questions. COMMON BRAND NAME(S): DARZALEX What should I tell my health care provider before I take this medicine? They need to know if you have any of these conditions: -infection (especially a virus infection such as chickenpox, herpes, or hepatitis B virus) -lung or breathing disease -an unusual or allergic reaction to daratumumab, other medicines, foods, dyes, or preservatives -pregnant or trying to get pregnant -breast-feeding How should I use this medicine? This medicine is for infusion into a vein. It is given by a health care professional in a hospital or clinic setting. Talk to your pediatrician regarding the use of this medicine in children. Special care may be needed. Overdosage: If you think you have taken too much of this medicine contact a poison control center or emergency room at once. NOTE: This medicine is only for you. Do not share this medicine with others. What if I miss a dose? Keep appointments for follow-up doses as directed. It is important not to miss your dose. Call your doctor or health care professional if you are unable to keep an appointment. What may interact with this medicine? Interactions have not been studied. Give your health care provider a list of all the medicines, herbs, non-prescription drugs, or dietary supplements you use. Also tell them if you smoke, drink alcohol, or use illegal drugs. Some items may interact with your medicine. This list may not describe all possible interactions. Give your health care provider a list of all the medicines, herbs, non-prescription drugs, or dietary supplements you use. Also tell them if you smoke, drink alcohol, or use illegal drugs. Some items may interact with your  medicine. What should I watch for while using this medicine? This drug may make you feel generally unwell. Report any side effects. Continue your course of treatment even though you feel ill unless your doctor tells you to stop. This medicine can cause serious allergic reactions. To reduce your risk you may need to take medicine before treatment with this medicine. Take your medicine as directed. This medicine can affect the results of blood tests to match your blood type. These changes can last for up to 6 months after the final dose. Your healthcare provider will do blood tests to match your blood type before you start treatment. Tell all of your healthcare providers that you are being treated with this medicine before receiving a blood transfusion. This medicine can affect the results of some tests used to determine treatment response; extra tests may be needed to evaluate response. Do not become pregnant while taking this medicine or for 3 months after stopping it. Women should inform their doctor if they wish to become pregnant or think they might be pregnant. There is a potential for serious side effects to an unborn child. Talk to your health care professional or pharmacist for more information. What side effects may I notice from receiving this medicine? Side effects that you should report to your doctor or health care professional as soon as possible: -allergic reactions like skin rash, itching or hives, swelling of the face, lips, or tongue -breathing problems -chills -cough -dizziness -feeling faint or lightheaded -headache -low blood counts - this medicine may decrease the number of white blood cells, red blood cells and platelets. You may be at increased   risk for infections and bleeding. -nausea, vomiting -shortness of breath -signs of decreased platelets or bleeding - bruising, pinpoint red spots on the skin, black, tarry stools, blood in the urine -signs of decreased red blood  cells - unusually weak or tired, feeling faint or lightheaded, falls -signs of infection - fever or chills, cough, sore throat, pain or difficulty passing urine -signs and symptoms of liver injury like dark yellow or brown urine; general ill feeling or flu-like symptoms; light-colored stools; loss of appetite; right upper belly pain; unusually weak or tired; yellowing of the eyes or skin Side effects that usually do not require medical attention (report to your doctor or health care professional if they continue or are bothersome): -back pain -constipation -loss of appetite -diarrhea -joint pain -muscle cramps -pain, tingling, numbness in the hands or feet -swelling of the ankles, feet, hands -tiredness -trouble sleeping This list may not describe all possible side effects. Call your doctor for medical advice about side effects. You may report side effects to FDA at 1-800-FDA-1088. Where should I keep my medicine? Keep out of the reach of children. This drug is given in a hospital or clinic and will not be stored at home. NOTE: This sheet is a summary. It may not cover all possible information. If you have questions about this medicine, talk to your doctor, pharmacist, or health care provider.  2019 Elsevier/Gold Standard (2017-08-17 15:52:44)

## 2018-05-09 NOTE — Telephone Encounter (Signed)
sched appts per 4/9 LOS. avs to print in infusion room

## 2018-05-09 NOTE — Progress Notes (Signed)
Hematology and Oncology Follow Up Visit  Emily Greer [id number] Jan 16, 1951 68 y.o. 05/09/2018   Principle Diagnosis:  Recurrent lambda light chain myeloma History of recurrent endometrial carcinoma Iron deficiency anemia-blood loss  PastTherapy: Status post second autologous stem cell transplant on 07/24/2014 Maintenance therapy with Pomalidomide/every 2 week Velcade - d/c'ed Xgeva 120 mg subcutaneous every 3months -next dose inNovember2019 Radiation therapy for endometrial recurrence - completed 04/20/2015 Pomalyst/Kyprolis 70mg /m2 IV q 2 weeks - s/p cycle #12 - held on 12/26/2017 for hematuria  Current Therapy:   Daratumumab/Pomalyst - status post cycle 2 Femara 2.5 mg po q day IV iron as indicated   Interim History:  Emily Greer is here today for follow-up. She is doing quite well. She had some sinus pressure and headache last week with all the pollen. This has since resolved.  She states that she has spoken with her insurance company and will be getting her Pomalyst later this week. She plans to call her pharmacy today as well.  Lambda light chains last month were down to 6.55 mg/dL from 40.98 mg/dL.  No fever, chills, n/v, cough, rash, dizziness, SOB, chest pain, palpitations, abdominal pain or changes in bowel or bladder habits.  Rare hot flash/night sweats with Femara.  No swelling, tenderness, numbness or tingling in her extremities.  No lymphadenopathy noted on exam.  No episodes of bleeding, no bruising or petechiae.  She is eating well and staying hydrated. Her weight is stable.   ECOG Performance Status: 1 - Symptomatic but completely ambulatory  Medications:  Allergies as of 05/09/2018      Reactions   Codeine Nausea Only      Medication List       Accurate as of May 09, 2018 10:07 AM. Always use your most recent med list.        acyclovir 400 MG tablet Commonly known as:  ZOVIRAX Take 1 tablet (400 mg total) by mouth 2 (two)  times daily.   albuterol 108 (90 Base) MCG/ACT inhaler Commonly known as:  PROVENTIL HFA;VENTOLIN HFA Inhale 2 puffs into the lungs every 6 (six) hours as needed for wheezing or shortness of breath. 2 puffs 3 times daily x 5 days then every 6 hours as needed.   amLODipine 10 MG tablet Commonly known as:  NORVASC TAKE 1 TABLET BY MOUTH EVERY MORNING   aspirin EC 81 MG tablet Take 81 mg by mouth 2 (two) times daily. Stopped  By dr Myna Hidalgo until urology procedures complete   letrozole 2.5 MG tablet Commonly known as:  FEMARA Take 1 tablet (2.5 mg total) by mouth daily.   loperamide 2 MG capsule Commonly known as:  IMODIUM Take by mouth as needed for diarrhea or loose stools. Reported on 08/19/2015   loratadine 10 MG tablet Commonly known as:  CLARITIN Take 10 mg by mouth every morning. Reported on 08/19/2015   LORazepam 0.5 MG tablet Commonly known as:  Ativan Take 1 tablet (0.5 mg total) by mouth every 6 (six) hours as needed (Nausea or vomiting).   montelukast 10 MG tablet Commonly known as:  Singulair Take 1 tablet (10 mg total) by mouth at bedtime.   Nasacort Allergy 24HR 55 MCG/ACT Aero nasal inhaler Generic drug:  triamcinolone Place 2 sprays into the nose as needed.   pomalidomide 1 MG capsule Commonly known as:  POMALYST Take 1 capsule (1 mg total) by mouth daily. Take with water on days 1-21. Repeat every 28 days. Authorization number [id number]   potassium chloride SA  20 MEQ tablet Commonly known as:  K-DUR,KLOR-CON TAKE 1 TABLET(20 MEQ) BY MOUTH TWICE DAILY   traMADol 50 MG tablet Commonly known as:  Ultram Take 1 tablet (50 mg total) by mouth every 6 (six) hours as needed.   triamterene-hydrochlorothiazide 37.5-25 MG tablet Commonly known as:  MAXZIDE-25 1 tablet by mouth at bedtime   Vitamin D3 50 MCG (2000 UT) Tabs Take 2 tablets by mouth daily.       Allergies:  Allergies  Allergen Reactions  . Codeine Nausea Only    Past Medical History,  Surgical history, Social history, and Family History were reviewed and updated.  Review of Systems: All other 10 point review of systems is negative.   Physical Exam:  weight is 189 lb 8 oz (86 kg). Her temperature is 98.6 F (37 C). Her blood pressure is 131/73 and her pulse is 90. Her respiration is 16 and oxygen saturation is 99%.   Wt Readings from Last 3 Encounters:  05/09/18 189 lb 8 oz (86 kg)  04/11/18 186 lb 8 oz (84.6 kg)  04/05/18 188 lb (85.3 kg)    Ocular: Sclerae unicteric, pupils equal, round and reactive to light Ear-nose-throat: Oropharynx clear, dentition fair Lymphatic: No cervical or supraclavicular adenopathy Lungs no rales or rhonchi, good excursion bilaterally Heart regular rate and rhythm, no murmur appreciated Abd soft, nontender, positive bowel sounds, no liver or spleen tip palpated on exam, no fluid wave  MSK no focal spinal tenderness, no joint edema Neuro: non-focal, well-oriented, appropriate affect Breasts: Deferred   Lab Results  Component Value Date   WBC 3.0 (L) 05/09/2018   HGB 11.1 (L) 05/09/2018   HCT 34.3 (L) 05/09/2018   MCV 81.5 05/09/2018   PLT 245 05/09/2018   Lab Results  Component Value Date   FERRITIN 630 (H) 04/11/2018   IRON 63 04/11/2018   TIBC 359 04/11/2018   UIBC 295 04/11/2018   IRONPCTSAT 18 (L) 04/11/2018   Lab Results  Component Value Date   RETICCTPCT 2.9 (H) 10/17/2017   RBC 4.21 05/09/2018   Lab Results  Component Value Date   KPAFRELGTCHN 1.1 (L) 04/11/2018   LAMBDASER 65.5 (H) 04/11/2018   KAPLAMBRATIO 0.02 (L) 04/11/2018   Lab Results  Component Value Date   IGGSERUM 452 (L) 04/11/2018   IGGSERUM 483 (L) 04/11/2018   IGA 16 (L) 04/11/2018   IGA 16 (L) 04/11/2018   IGMSERUM 5 (L) 04/11/2018   IGMSERUM 6 (L) 04/11/2018   Lab Results  Component Value Date   TOTALPROTELP 5.8 (L) 04/11/2018   ALBUMINELP 3.3 04/11/2018   A1GS 0.2 04/11/2018   A2GS 1.0 04/11/2018   BETS 1.0 04/11/2018   BETA2SER  0.4 11/23/2014   GAMS 0.3 (L) 04/11/2018   MSPIKE 0.1 (H) 04/11/2018   SPEI Comment 12/26/2017     Chemistry      Component Value Date/Time   NA 140 05/02/2018 0927   NA 141 01/10/2017 1115   NA 140 06/21/2016 0918   K 3.8 05/02/2018 0927   K 4.0 01/10/2017 1115   K 4.3 06/21/2016 0918   CL 106 05/02/2018 0927   CL 106 01/10/2017 1115   CO2 26 05/02/2018 0927   CO2 27 01/10/2017 1115   CO2 20 (L) 06/21/2016 0918   BUN 19 05/02/2018 0927   BUN 15 01/10/2017 1115   BUN 15.8 06/21/2016 0918   CREATININE 0.84 05/02/2018 0927   CREATININE 1.0 01/10/2017 1115   CREATININE 0.8 06/21/2016 1610  Component Value Date/Time   CALCIUM 8.7 (L) 05/02/2018 0927   CALCIUM 9.5 01/10/2017 1115   CALCIUM 9.4 06/21/2016 0918   ALKPHOS 37 (L) 05/02/2018 0927   ALKPHOS 40 01/10/2017 1115   ALKPHOS 66 06/21/2016 0918   AST 11 (L) 05/02/2018 0927   AST 17 06/21/2016 0918   ALT 14 05/02/2018 0927   ALT 19 01/10/2017 1115   ALT 37 06/21/2016 0918   BILITOT 0.3 05/02/2018 0927   BILITOT 0.32 06/21/2016 0918       Impression and Plan: [name] is a very pleasant 68 yo African American female with recurrent lambda light chain myeloma. Her second a stem cell transplant for light chain myeloma was in June 2016. She also has localized recurrent endometrial cancer (followed by gyn onc Dr. Nelly Rout).  She is doing well tolerating and responding to weekly Darzalex and will be getting her Pomalyst later this week. She will continue this same regimen.  We will plan to see her back in another 4 weeks for follow-up.  She will contact our office with any questions or concerns. We can certainly see her sooner if need be.   Emeline Gins, NP 4/9/202010:07 AM

## 2018-05-09 NOTE — Patient Instructions (Signed)

## 2018-05-10 LAB — KAPPA/LAMBDA LIGHT CHAINS
Kappa free light chain: 1.4 mg/L — ABNORMAL LOW (ref 3.3–19.4)
Kappa, lambda light chain ratio: 0.03 — ABNORMAL LOW (ref 0.26–1.65)
Lambda free light chains: 48.9 mg/L — ABNORMAL HIGH (ref 5.7–26.3)

## 2018-05-10 LAB — IGG, IGA, IGM
IgA: 16 mg/dL — ABNORMAL LOW (ref 87–352)
IgG (Immunoglobin G), Serum: 424 mg/dL — ABNORMAL LOW (ref 586–1602)
IgM (Immunoglobulin M), Srm: 7 mg/dL — ABNORMAL LOW (ref 26–217)

## 2018-05-13 LAB — PROTEIN ELECTROPHORESIS, SERUM, WITH REFLEX
A/G Ratio: 1.4 (ref 0.7–1.7)
Albumin ELP: 3.6 g/dL (ref 2.9–4.4)
Alpha-1-Globulin: 0.2 g/dL (ref 0.0–0.4)
Alpha-2-Globulin: 1 g/dL (ref 0.4–1.0)
Beta Globulin: 1 g/dL (ref 0.7–1.3)
Gamma Globulin: 0.3 g/dL — ABNORMAL LOW (ref 0.4–1.8)
Globulin, Total: 2.6 g/dL (ref 2.2–3.9)
M-Spike, %: 0.1 g/dL — ABNORMAL HIGH
SPEP Interpretation: 0
Total Protein ELP: 6.2 g/dL (ref 6.0–8.5)

## 2018-05-13 LAB — IMMUNOFIXATION REFLEX, SERUM
IgA: 17 mg/dL — ABNORMAL LOW (ref 87–352)
IgG (Immunoglobin G), Serum: 509 mg/dL — ABNORMAL LOW (ref 586–1602)
IgM (Immunoglobulin M), Srm: 8 mg/dL — ABNORMAL LOW (ref 26–217)

## 2018-05-14 ENCOUNTER — Telehealth: Payer: Self-pay | Admitting: Hematology & Oncology

## 2018-05-14 NOTE — Telephone Encounter (Signed)
Received call fro pt to [facility] 4/16 appt due to this week being her off week for treatment. Pt will return 4/23 at 0830 am

## 2018-05-16 ENCOUNTER — Other Ambulatory Visit: Payer: BC Managed Care – PPO

## 2018-05-16 ENCOUNTER — Ambulatory Visit: Payer: BC Managed Care – PPO

## 2018-05-23 ENCOUNTER — Inpatient Hospital Stay: Payer: Medicare Other

## 2018-05-23 ENCOUNTER — Other Ambulatory Visit: Payer: Self-pay

## 2018-05-23 VITALS — BP 134/74 | HR 69 | Temp 98.0°F | Resp 20

## 2018-05-23 DIAGNOSIS — C9000 Multiple myeloma not having achieved remission: Secondary | ICD-10-CM | POA: Diagnosis not present

## 2018-05-23 DIAGNOSIS — C9001 Multiple myeloma in remission: Secondary | ICD-10-CM

## 2018-05-23 DIAGNOSIS — Z7951 Long term (current) use of inhaled steroids: Secondary | ICD-10-CM | POA: Diagnosis not present

## 2018-05-23 DIAGNOSIS — R61 Generalized hyperhidrosis: Secondary | ICD-10-CM | POA: Diagnosis not present

## 2018-05-23 DIAGNOSIS — Z5112 Encounter for antineoplastic immunotherapy: Secondary | ICD-10-CM | POA: Diagnosis not present

## 2018-05-23 DIAGNOSIS — Z79811 Long term (current) use of aromatase inhibitors: Secondary | ICD-10-CM | POA: Diagnosis not present

## 2018-05-23 DIAGNOSIS — C541 Malignant neoplasm of endometrium: Secondary | ICD-10-CM | POA: Diagnosis not present

## 2018-05-23 LAB — CBC WITH DIFFERENTIAL (CANCER CENTER ONLY)
Abs Immature Granulocytes: 0.03 10*3/uL (ref 0.00–0.07)
Basophils Absolute: 0.1 10*3/uL (ref 0.0–0.1)
Basophils Relative: 3 %
Eosinophils Absolute: 0.2 10*3/uL (ref 0.0–0.5)
Eosinophils Relative: 5 %
HCT: 32.7 % — ABNORMAL LOW (ref 36.0–46.0)
Hemoglobin: 10.6 g/dL — ABNORMAL LOW (ref 12.0–15.0)
Immature Granulocytes: 1 %
Lymphocytes Relative: 18 %
Lymphs Abs: 0.8 10*3/uL (ref 0.7–4.0)
MCH: 26.7 pg (ref 26.0–34.0)
MCHC: 32.4 g/dL (ref 30.0–36.0)
MCV: 82.4 fL (ref 80.0–100.0)
Monocytes Absolute: 0.6 10*3/uL (ref 0.1–1.0)
Monocytes Relative: 14 %
Neutro Abs: 2.5 10*3/uL (ref 1.7–7.7)
Neutrophils Relative %: 59 %
Platelet Count: 262 10*3/uL (ref 150–400)
RBC: 3.97 MIL/uL (ref 3.87–5.11)
RDW: 19 % — ABNORMAL HIGH (ref 11.5–15.5)
WBC Count: 4.3 10*3/uL (ref 4.0–10.5)
nRBC: 0 % (ref 0.0–0.2)

## 2018-05-23 LAB — CMP (CANCER CENTER ONLY)
ALT: 14 U/L (ref 0–44)
AST: 11 U/L — ABNORMAL LOW (ref 15–41)
Albumin: 4.3 g/dL (ref 3.5–5.0)
Alkaline Phosphatase: 43 U/L (ref 38–126)
Anion gap: 9 (ref 5–15)
BUN: 13 mg/dL (ref 8–23)
CO2: 24 mmol/L (ref 22–32)
Calcium: 9.4 mg/dL (ref 8.9–10.3)
Chloride: 107 mmol/L (ref 98–111)
Creatinine: 0.88 mg/dL (ref 0.44–1.00)
GFR, Est AFR Am: >60 mL/min (ref >60)
GFR, Est Non Af Am: >60 mL/min (ref >60)
Glucose, Bld: 92 mg/dL (ref 70–99)
Potassium: 3.6 mmol/L (ref 3.5–5.1)
Sodium: 140 mmol/L (ref 135–145)
Total Bilirubin: 0.5 mg/dL (ref 0.3–1.2)
Total Protein: 6 g/dL — ABNORMAL LOW (ref 6.5–8.1)

## 2018-05-23 MED ORDER — SODIUM CHLORIDE 0.9 % IV SOLN
1400.0000 mg | Freq: Once | INTRAVENOUS | Status: AC
  Administered 2018-05-23: 1400 mg via INTRAVENOUS
  Filled 2018-05-23: qty 60

## 2018-05-23 MED ORDER — HEPARIN SOD (PORK) LOCK FLUSH 100 UNIT/ML IV SOLN
500.0000 [IU] | Freq: Once | INTRAVENOUS | Status: AC | PRN
  Administered 2018-05-23: 500 [IU]
  Filled 2018-05-23: qty 5

## 2018-05-23 MED ORDER — DIPHENHYDRAMINE HCL 25 MG PO CAPS
ORAL_CAPSULE | ORAL | Status: AC
  Filled 2018-05-23: qty 2

## 2018-05-23 MED ORDER — ACETAMINOPHEN 325 MG PO TABS
ORAL_TABLET | ORAL | Status: AC
  Filled 2018-05-23: qty 2

## 2018-05-23 MED ORDER — SODIUM CHLORIDE 0.9% FLUSH
10.0000 mL | INTRAVENOUS | Status: DC | PRN
  Administered 2018-05-23: 10 mL
  Filled 2018-05-23: qty 10

## 2018-05-23 MED ORDER — ACETAMINOPHEN 325 MG PO TABS
650.0000 mg | ORAL_TABLET | Freq: Once | ORAL | Status: AC
  Administered 2018-05-23: 10:00:00 650 mg via ORAL

## 2018-05-23 MED ORDER — METHYLPREDNISOLONE SODIUM SUCC 125 MG IJ SOLR
INTRAMUSCULAR | Status: AC
  Filled 2018-05-23: qty 2

## 2018-05-23 MED ORDER — DIPHENHYDRAMINE HCL 25 MG PO CAPS
50.0000 mg | ORAL_CAPSULE | Freq: Once | ORAL | Status: AC
  Administered 2018-05-23: 50 mg via ORAL

## 2018-05-23 MED ORDER — SODIUM CHLORIDE 0.9 % IV SOLN
Freq: Once | INTRAVENOUS | Status: AC
  Administered 2018-05-23: 10:00:00 via INTRAVENOUS
  Filled 2018-05-23: qty 250

## 2018-05-23 MED ORDER — METHYLPREDNISOLONE SODIUM SUCC 125 MG IJ SOLR
100.0000 mg | Freq: Once | INTRAMUSCULAR | Status: AC
  Administered 2018-05-23: 100 mg via INTRAVENOUS

## 2018-05-23 MED ORDER — SODIUM CHLORIDE 0.9% FLUSH
10.0000 mL | Freq: Once | INTRAVENOUS | Status: AC
  Administered 2018-05-23: 10 mL
  Filled 2018-05-23: qty 10

## 2018-05-23 NOTE — Patient Instructions (Signed)
[facility] Discharge Instructions for Patients Receiving Chemotherapy  Today you received the following chemotherapy agents Darzalex  To help prevent nausea and vomiting after your treatment, we encourage you to take your nausea medication as prescribed.  If you develop nausea and vomiting that is not controlled by your nausea medication, call the clinic.   BELOW ARE SYMPTOMS THAT SHOULD BE REPORTED IMMEDIATELY:  *FEVER GREATER THAN 100.5 F  *CHILLS WITH OR WITHOUT FEVER  NAUSEA AND VOMITING THAT IS NOT CONTROLLED WITH YOUR NAUSEA MEDICATION  *UNUSUAL SHORTNESS OF BREATH  *UNUSUAL BRUISING OR BLEEDING  TENDERNESS IN MOUTH AND THROAT WITH OR WITHOUT PRESENCE OF ULCERS  *URINARY PROBLEMS  *BOWEL PROBLEMS  UNUSUAL RASH Items with * indicate a potential emergency and should be followed up as soon as possible.  Feel free to call the clinic should you have any questions or concerns. The clinic phone number is [phone].  Please show the [facility] at check-in to the Emergency Department and triage nurse.

## 2018-05-25 ENCOUNTER — Other Ambulatory Visit: Payer: Self-pay | Admitting: Hematology & Oncology

## 2018-05-30 ENCOUNTER — Other Ambulatory Visit: Payer: BC Managed Care – PPO

## 2018-05-30 ENCOUNTER — Ambulatory Visit: Payer: BC Managed Care – PPO

## 2018-05-31 ENCOUNTER — Other Ambulatory Visit: Payer: Self-pay | Admitting: *Deleted

## 2018-05-31 MED ORDER — POMALIDOMIDE 1 MG PO CAPS: 1.0000 mg | ORAL_CAPSULE | Freq: Every day | ORAL | 0 refills | Status: DC

## 2018-06-06 ENCOUNTER — Inpatient Hospital Stay: Payer: Medicare Other

## 2018-06-06 ENCOUNTER — Encounter: Payer: Self-pay | Admitting: Hematology & Oncology

## 2018-06-06 ENCOUNTER — Other Ambulatory Visit: Payer: Self-pay

## 2018-06-06 ENCOUNTER — Inpatient Hospital Stay: Payer: Medicare Other | Attending: Hematology & Oncology | Admitting: Hematology & Oncology

## 2018-06-06 VITALS — BP 145/82 | HR 82 | Temp 98.1°F | Resp 18 | Wt 198.0 lb

## 2018-06-06 DIAGNOSIS — C541 Malignant neoplasm of endometrium: Secondary | ICD-10-CM

## 2018-06-06 DIAGNOSIS — C9000 Multiple myeloma not having achieved remission: Secondary | ICD-10-CM

## 2018-06-06 DIAGNOSIS — Z79811 Long term (current) use of aromatase inhibitors: Secondary | ICD-10-CM | POA: Insufficient documentation

## 2018-06-06 DIAGNOSIS — Z79899 Other long term (current) drug therapy: Secondary | ICD-10-CM

## 2018-06-06 DIAGNOSIS — Z923 Personal history of irradiation: Secondary | ICD-10-CM | POA: Insufficient documentation

## 2018-06-06 DIAGNOSIS — D509 Iron deficiency anemia, unspecified: Secondary | ICD-10-CM | POA: Insufficient documentation

## 2018-06-06 DIAGNOSIS — Z7982 Long term (current) use of aspirin: Secondary | ICD-10-CM | POA: Insufficient documentation

## 2018-06-06 DIAGNOSIS — Z7951 Long term (current) use of inhaled steroids: Secondary | ICD-10-CM | POA: Diagnosis not present

## 2018-06-06 DIAGNOSIS — Z5112 Encounter for antineoplastic immunotherapy: Secondary | ICD-10-CM | POA: Insufficient documentation

## 2018-06-06 DIAGNOSIS — C9001 Multiple myeloma in remission: Secondary | ICD-10-CM

## 2018-06-06 DIAGNOSIS — I10 Essential (primary) hypertension: Secondary | ICD-10-CM | POA: Insufficient documentation

## 2018-06-06 LAB — CMP (CANCER CENTER ONLY)
ALT: 16 U/L (ref 0–44)
AST: 14 U/L — ABNORMAL LOW (ref 15–41)
Albumin: 4.1 g/dL (ref 3.5–5.0)
Alkaline Phosphatase: 38 U/L (ref 38–126)
Anion gap: 10 (ref 5–15)
BUN: 17 mg/dL (ref 8–23)
CO2: 24 mmol/L (ref 22–32)
Calcium: 9.5 mg/dL (ref 8.9–10.3)
Chloride: 107 mmol/L (ref 98–111)
Creatinine: 0.92 mg/dL (ref 0.44–1.00)
GFR, Est AFR Am: >60 mL/min (ref >60)
GFR, Estimated: >60 mL/min (ref >60)
Glucose, Bld: 96 mg/dL (ref 70–99)
Potassium: 3.6 mmol/L (ref 3.5–5.1)
Sodium: 141 mmol/L (ref 135–145)
Total Bilirubin: 0.3 mg/dL (ref 0.3–1.2)
Total Protein: 6.1 g/dL — ABNORMAL LOW (ref 6.5–8.1)

## 2018-06-06 LAB — CBC WITH DIFFERENTIAL (CANCER CENTER ONLY)
Abs Immature Granulocytes: 0.02 10*3/uL (ref 0.00–0.07)
Basophils Absolute: 0.1 10*3/uL (ref 0.0–0.1)
Basophils Relative: 3 %
Eosinophils Absolute: 0.3 10*3/uL (ref 0.0–0.5)
Eosinophils Relative: 7 %
HCT: 32.2 % — ABNORMAL LOW (ref 36.0–46.0)
Hemoglobin: 10.3 g/dL — ABNORMAL LOW (ref 12.0–15.0)
Immature Granulocytes: 1 %
Lymphocytes Relative: 14 %
Lymphs Abs: 0.6 10*3/uL — ABNORMAL LOW (ref 0.7–4.0)
MCH: 26.3 pg (ref 26.0–34.0)
MCHC: 32 g/dL (ref 30.0–36.0)
MCV: 82.4 fL (ref 80.0–100.0)
Monocytes Absolute: 0.4 10*3/uL (ref 0.1–1.0)
Monocytes Relative: 10 %
Neutro Abs: 2.7 10*3/uL (ref 1.7–7.7)
Neutrophils Relative %: 65 %
Platelet Count: 231 10*3/uL (ref 150–400)
RBC: 3.91 MIL/uL (ref 3.87–5.11)
RDW: 18.5 % — ABNORMAL HIGH (ref 11.5–15.5)
WBC Count: 4.1 10*3/uL (ref 4.0–10.5)
nRBC: 0 % (ref 0.0–0.2)

## 2018-06-06 MED ORDER — ACETAMINOPHEN 325 MG PO TABS
ORAL_TABLET | ORAL | Status: AC
  Filled 2018-06-06: qty 2

## 2018-06-06 MED ORDER — DIPHENHYDRAMINE HCL 25 MG PO CAPS
ORAL_CAPSULE | ORAL | Status: AC
  Filled 2018-06-06: qty 2

## 2018-06-06 MED ORDER — DENOSUMAB 120 MG/1.7ML ~~LOC~~ SOLN
SUBCUTANEOUS | Status: AC
  Filled 2018-06-06: qty 1.7

## 2018-06-06 MED ORDER — DENOSUMAB 120 MG/1.7ML ~~LOC~~ SOLN
120.0000 mg | Freq: Once | SUBCUTANEOUS | Status: AC
  Administered 2018-06-06: 120 mg via SUBCUTANEOUS

## 2018-06-06 MED ORDER — SODIUM CHLORIDE 0.9% FLUSH
10.0000 mL | INTRAVENOUS | Status: DC | PRN
  Administered 2018-06-06: 10 mL
  Filled 2018-06-06: qty 10

## 2018-06-06 MED ORDER — SODIUM CHLORIDE 0.9 % IV SOLN
15.4000 mg/kg | Freq: Once | INTRAVENOUS | Status: AC
  Administered 2018-06-06: 1300 mg via INTRAVENOUS
  Filled 2018-06-06: qty 60

## 2018-06-06 MED ORDER — DIPHENHYDRAMINE HCL 25 MG PO CAPS
50.0000 mg | ORAL_CAPSULE | Freq: Once | ORAL | Status: AC
  Administered 2018-06-06: 50 mg via ORAL

## 2018-06-06 MED ORDER — HEPARIN SOD (PORK) LOCK FLUSH 100 UNIT/ML IV SOLN
500.0000 [IU] | Freq: Once | INTRAVENOUS | Status: AC | PRN
  Administered 2018-06-06: 500 [IU]
  Filled 2018-06-06: qty 5

## 2018-06-06 MED ORDER — SODIUM CHLORIDE 0.9 % IV SOLN
16.5000 mg/kg | Freq: Once | INTRAVENOUS | Status: DC
  Filled 2018-06-06: qty 70

## 2018-06-06 MED ORDER — ACETAMINOPHEN 325 MG PO TABS
650.0000 mg | ORAL_TABLET | Freq: Once | ORAL | Status: AC
  Administered 2018-06-06: 650 mg via ORAL

## 2018-06-06 MED ORDER — METHYLPREDNISOLONE SODIUM SUCC 125 MG IJ SOLR
100.0000 mg | Freq: Once | INTRAMUSCULAR | Status: AC
  Administered 2018-06-06: 100 mg via INTRAVENOUS

## 2018-06-06 MED ORDER — SODIUM CHLORIDE 0.9 % IV SOLN
Freq: Once | INTRAVENOUS | Status: AC
  Administered 2018-06-06: 10:00:00 via INTRAVENOUS
  Filled 2018-06-06: qty 250

## 2018-06-06 MED ORDER — METHYLPREDNISOLONE SODIUM SUCC 125 MG IJ SOLR
INTRAMUSCULAR | Status: AC
  Filled 2018-06-06: qty 2

## 2018-06-06 NOTE — Progress Notes (Signed)
Hematology and Oncology Follow Up Visit  RHEGAN [name] [id number] Sep 24, 1950 68 y.o. 06/06/2018   Principle Diagnosis:  Recurrent lambda light chain myeloma History of recurrent endometrial carcinoma Iron deficiency anemia-blood loss  PastTherapy: Status post second autologous stem cell transplant on 07/24/2014 Maintenance therapy with Pomalidomide/every 2 week Velcade - d/c'ed Xgeva 120 mg subcutaneous every 3months -next dose inNovember2019 Radiation therapy for endometrial recurrence - completed 04/20/2015 Pomalyst/Kyprolis 70mg /m2 IV q 2 weeks - s/p cycle #12 - held on 12/26/2017 for hematuria  Current Therapy:   Daratumumab/Pomalyst - status post cycle #3 Femara 2.5 mg po q day IV iron as indicated   Interim History:  [name] is here today for follow-up.  Thankfully, she is doing well with the daratumumab.  She really has had no side effects from this.  She is on the every 2-week portion of the protocol.  Her light chains have are improving.  This I am happy about.  Her last lambda light chain was down to 4.9 mg/dL.  She has had no problems with bleeding.  There is no cough or shortness of breath.  She has had no issues with leg swelling.  She is still doing okay on the Femara for the history of recurrent uterine cancer.  She has had no fever.  There is no chest wall pain.  She has had no bony pain.    We are going to have to get a 24-hour urine on her the next time we see her.  I think this will be very important.  Her last 24-hour urine was done back in January which showed a lambda light chain excretion of 453 mg.  Overall, her performance status is ECOG 1.    Medications:  Allergies as of 06/06/2018      Reactions   Codeine Nausea Only      Medication List       Accurate as of Jun 06, 2018  9:06 AM. If you have any questions, ask your nurse or doctor.        acyclovir 400 MG tablet Commonly known as:  ZOVIRAX Take 1 tablet (400 mg  total) by mouth 2 (two) times daily.   albuterol 108 (90 Base) MCG/ACT inhaler Commonly known as:  VENTOLIN HFA Inhale 2 puffs into the lungs every 6 (six) hours as needed for wheezing or shortness of breath. 2 puffs 3 times daily x 5 days then every 6 hours as needed.   amLODipine 10 MG tablet Commonly known as:  NORVASC TAKE 1 TABLET BY MOUTH EVERY MORNING   aspirin EC 81 MG tablet Take 81 mg by mouth 2 (two) times daily. Stopped  By dr Myna Hidalgo until urology procedures complete   letrozole 2.5 MG tablet Commonly known as:  FEMARA Take 1 tablet (2.5 mg total) by mouth daily.   loperamide 2 MG capsule Commonly known as:  IMODIUM Take by mouth as needed for diarrhea or loose stools. Reported on 08/19/2015   loratadine 10 MG tablet Commonly known as:  CLARITIN Take 10 mg by mouth every morning. Reported on 08/19/2015   LORazepam 0.5 MG tablet Commonly known as:  Ativan Take 1 tablet (0.5 mg total) by mouth every 6 (six) hours as needed (Nausea or vomiting).   montelukast 10 MG tablet Commonly known as:  SINGULAIR TAKE 1 TABLET(10 MG) BY MOUTH AT BEDTIME   Nasacort Allergy 24HR 55 MCG/ACT Aero nasal inhaler Generic drug:  triamcinolone Place 2 sprays into the nose as needed.   pomalidomide  1 MG capsule Commonly known as:  POMALYST Take 1 capsule (1 mg total) by mouth daily. Take with water on days 1-21. Repeat every 28 days. Authorization number [id number]   potassium chloride SA 20 MEQ tablet Commonly known as:  K-DUR TAKE 1 TABLET(20 MEQ) BY MOUTH TWICE DAILY   traMADol 50 MG tablet Commonly known as:  Ultram Take 1 tablet (50 mg total) by mouth every 6 (six) hours as needed.   triamterene-hydrochlorothiazide 37.5-25 MG tablet Commonly known as:  MAXZIDE-25 1 tablet by mouth at bedtime   Vitamin D3 50 MCG (2000 UT) Tabs Take 2 tablets by mouth daily.       Allergies:  Allergies  Allergen Reactions  . Codeine Nausea Only    Past Medical History, Surgical  history, Social history, and Family History were reviewed and updated.  Review of Systems: Review of Systems  Constitutional: Negative.   HENT: Negative.   Eyes: Negative.   Respiratory: Negative.   Cardiovascular: Negative.   Gastrointestinal: Negative.   Genitourinary: Negative.   Musculoskeletal: Negative.   Skin: Negative.   Neurological: Negative.   Endo/Heme/Allergies: Negative.   Psychiatric/Behavioral: Negative.      Physical Exam:  weight is 198 lb (89.8 kg). Her oral temperature is 98.1 F (36.7 C). Her blood pressure is 145/82 (abnormal) and her pulse is 82. Her respiration is 18 and oxygen saturation is 98%.   Wt Readings from Last 3 Encounters:  06/06/18 198 lb (89.8 kg)  05/09/18 189 lb 8 oz (86 kg)  04/11/18 186 lb 8 oz (84.6 kg)    Physical Exam Vitals signs reviewed.  HENT:     Head: Normocephalic and atraumatic.  Eyes:     Pupils: Pupils are equal, round, and reactive to light.  Neck:     Musculoskeletal: Normal range of motion.  Cardiovascular:     Rate and Rhythm: Normal rate and regular rhythm.     Heart sounds: Normal heart sounds.  Pulmonary:     Effort: Pulmonary effort is normal.     Breath sounds: Normal breath sounds.  Abdominal:     General: Bowel sounds are normal.     Palpations: Abdomen is soft.  Musculoskeletal: Normal range of motion.        General: No tenderness or deformity.  Lymphadenopathy:     Cervical: No cervical adenopathy.  Skin:    General: Skin is warm and dry.     Findings: No erythema or rash.  Neurological:     Mental Status: She is alert and oriented to person, place, and time.  Psychiatric:        Behavior: Behavior normal.        Thought Content: Thought content normal.        Judgment: Judgment normal.      Lab Results  Component Value Date   WBC 4.1 06/06/2018   HGB 10.3 (L) 06/06/2018   HCT 32.2 (L) 06/06/2018   MCV 82.4 06/06/2018   PLT 231 06/06/2018   Lab Results  Component Value Date    FERRITIN 630 (H) 04/11/2018   IRON 63 04/11/2018   TIBC 359 04/11/2018   UIBC 295 04/11/2018   IRONPCTSAT 18 (L) 04/11/2018   Lab Results  Component Value Date   RETICCTPCT 2.9 (H) 10/17/2017   RBC 3.91 06/06/2018   Lab Results  Component Value Date   KPAFRELGTCHN 1.4 (L) 05/09/2018   LAMBDASER 48.9 (H) 05/09/2018   KAPLAMBRATIO 0.03 (L) 05/09/2018   Lab Results  Component  Value Date   IGGSERUM 424 (L) 05/09/2018   IGGSERUM 509 (L) 05/09/2018   IGA 16 (L) 05/09/2018   IGA 17 (L) 05/09/2018   IGMSERUM 7 (L) 05/09/2018   IGMSERUM 8 (L) 05/09/2018   Lab Results  Component Value Date   TOTALPROTELP 6.2 05/09/2018   ALBUMINELP 3.6 05/09/2018   A1GS 0.2 05/09/2018   A2GS 1.0 05/09/2018   BETS 1.0 05/09/2018   BETA2SER 0.4 11/23/2014   GAMS 0.3 (L) 05/09/2018   MSPIKE 0.1 (H) 05/09/2018   SPEI Comment 12/26/2017     Chemistry      Component Value Date/Time   NA 140 05/23/2018 0855   NA 141 01/10/2017 1115   NA 140 06/21/2016 0918   K 3.6 05/23/2018 0855   K 4.0 01/10/2017 1115   K 4.3 06/21/2016 0918   CL 107 05/23/2018 0855   CL 106 01/10/2017 1115   CO2 24 05/23/2018 0855   CO2 27 01/10/2017 1115   CO2 20 (L) 06/21/2016 0918   BUN 13 05/23/2018 0855   BUN 15 01/10/2017 1115   BUN 15.8 06/21/2016 0918   CREATININE 0.88 05/23/2018 0855   CREATININE 1.0 01/10/2017 1115   CREATININE 0.8 06/21/2016 0918      Component Value Date/Time   CALCIUM 9.4 05/23/2018 0855   CALCIUM 9.5 01/10/2017 1115   CALCIUM 9.4 06/21/2016 0918   ALKPHOS 43 05/23/2018 0855   ALKPHOS 40 01/10/2017 1115   ALKPHOS 66 06/21/2016 0918   AST 11 (L) 05/23/2018 0855   AST 17 06/21/2016 0918   ALT 14 05/23/2018 0855   ALT 19 01/10/2017 1115   ALT 37 06/21/2016 0918   BILITOT 0.5 05/23/2018 0855   BILITOT 0.32 06/21/2016 0918       Impression and Plan: Ms. Matura is a very pleasant 68 yo African American female with recurrent lambda light chain myeloma.   Her second a stem cell  transplant for light chain myeloma was in June 2016.   She also has localized recurrent endometrial cancer (followed by gyn onc Dr. Nelly Rout).  She is doing well with this.  Again, I am glad to see that her light chains coming down.  We will have to monitor her lambda light chain also in her urine.  She has her Darzalex every 2 weeks.  We will have her come back in 2 weeks just for Darzalex.  We will see her back in a month ourselves.   Josph Macho, MD 5/7/20209:06 AM

## 2018-06-06 NOTE — Patient Instructions (Signed)
dara Denosumab injection What is this medicine? DENOSUMAB (den oh sue mab) slows bone breakdown. Prolia is used to treat osteoporosis in women after menopause and in men, and in people who are taking corticosteroids for 6 months or more. Delton See is used to treat a high calcium level due to cancer and to prevent bone fractures and other bone problems caused by multiple myeloma or cancer bone metastases. Delton See is also used to treat giant cell tumor of the bone. This medicine may be used for other purposes; ask your health care provider or pharmacist if you have questions. COMMON BRAND NAME(S): Prolia, XGEVA What should I tell my health care provider before I take this medicine? They need to know if you have any of these conditions: -dental disease -having surgery or tooth extraction -infection -kidney disease -low levels of calcium or Vitamin D in the blood -malnutrition -on hemodialysis -skin conditions or sensitivity -thyroid or parathyroid disease -an unusual reaction to denosumab, other medicines, foods, dyes, or preservatives -pregnant or trying to get pregnant -breast-feeding How should I use this medicine? This medicine is for injection under the skin. It is given by a health care professional in a hospital or clinic setting. A special MedGuide will be given to you before each treatment. Be sure to read this information carefully each time. For Prolia, talk to your pediatrician regarding the use of this medicine in children. Special care may be needed. For Delton See, talk to your pediatrician regarding the use of this medicine in children. While this drug may be prescribed for children as young as 13 years for selected conditions, precautions do apply. Overdosage: If you think you have taken too much of this medicine contact a poison control center or emergency room at once. NOTE: This medicine is only for you. Do not share this medicine with others. What if I miss a dose? It is important  not to miss your dose. Call your doctor or health care professional if you are unable to keep an appointment. What may interact with this medicine? Do not take this medicine with any of the following medications: -other medicines containing denosumab This medicine may also interact with the following medications: -medicines that lower your chance of fighting infection -steroid medicines like prednisone or cortisone This list may not describe all possible interactions. Give your health care provider a list of all the medicines, herbs, non-prescription drugs, or dietary supplements you use. Also tell them if you smoke, drink alcohol, or use illegal drugs. Some items may interact with your medicine. What should I watch for while using this medicine? Visit your doctor or health care professional for regular checks on your progress. Your doctor or health care professional may order blood tests and other tests to see how you are doing. Call your doctor or health care professional for advice if you get a fever, chills or sore throat, or other symptoms of a cold or flu. Do not treat yourself. This drug may decrease your body's ability to fight infection. Try to avoid being around people who are sick. You should make sure you get enough calcium and vitamin D while you are taking this medicine, unless your doctor tells you not to. Discuss the foods you eat and the vitamins you take with your health care professional. See your dentist regularly. Brush and floss your teeth as directed. Before you have any dental work done, tell your dentist you are receiving this medicine. Do not become pregnant while taking this medicine or for 5  months after stopping it. Talk with your doctor or health care professional about your birth control options while taking this medicine. Women should inform their doctor if they wish to become pregnant or think they might be pregnant. There is a potential for serious side effects to an  unborn child. Talk to your health care professional or pharmacist for more information. What side effects may I notice from receiving this medicine? Side effects that you should report to your doctor or health care professional as soon as possible: -allergic reactions like skin rash, itching or hives, swelling of the face, lips, or tongue -bone pain -breathing problems -dizziness -jaw pain, especially after dental work -redness, blistering, peeling of the skin -signs and symptoms of infection like fever or chills; cough; sore throat; pain or trouble passing urine -signs of low calcium like fast heartbeat, muscle cramps or muscle pain; pain, tingling, numbness in the hands or feet; seizures -unusual bleeding or bruising -unusually weak or tired Side effects that usually do not require medical attention (report to your doctor or health care professional if they continue or are bothersome): -constipation -diarrhea -headache -joint pain -loss of appetite -muscle pain -runny nose -tiredness -upset stomach This list may not describe all possible side effects. Call your doctor for medical advice about side effects. You may report side effects to FDA at 1-800-FDA-1088. Where should I keep my medicine? This medicine is only given in a clinic, doctor's office, or other health care setting and will not be stored at home. NOTE: This sheet is a summary. It may not cover all possible information. If you have questions about this medicine, talk to your doctor, pharmacist, or health care provider.  2019 Elsevier/Gold Standard (2017-05-25 16:10:44) Daratumumab injection What is this medicine? DARATUMUMAB (dar a toom ue mab) is a monoclonal antibody. It is used to treat multiple myeloma. This medicine may be used for other purposes; ask your health care provider or pharmacist if you have questions. COMMON BRAND NAME(S): DARZALEX What should I tell my health care provider before I take this  medicine? They need to know if you have any of these conditions: -infection (especially a virus infection such as chickenpox, herpes, or hepatitis B virus) -lung or breathing disease -an unusual or allergic reaction to daratumumab, other medicines, foods, dyes, or preservatives -pregnant or trying to get pregnant -breast-feeding How should I use this medicine? This medicine is for infusion into a vein. It is given by a health care professional in a hospital or clinic setting. Talk to your pediatrician regarding the use of this medicine in children. Special care may be needed. Overdosage: If you think you have taken too much of this medicine contact a poison control center or emergency room at once. NOTE: This medicine is only for you. Do not share this medicine with others. What if I miss a dose? Keep appointments for follow-up doses as directed. It is important not to miss your dose. Call your doctor or health care professional if you are unable to keep an appointment. What may interact with this medicine? Interactions have not been studied. Give your health care provider a list of all the medicines, herbs, non-prescription drugs, or dietary supplements you use. Also tell them if you smoke, drink alcohol, or use illegal drugs. Some items may interact with your medicine. This list may not describe all possible interactions. Give your health care provider a list of all the medicines, herbs, non-prescription drugs, or dietary supplements you use. Also tell them if  you smoke, drink alcohol, or use illegal drugs. Some items may interact with your medicine. What should I watch for while using this medicine? This drug may make you feel generally unwell. Report any side effects. Continue your course of treatment even though you feel ill unless your doctor tells you to stop. This medicine can cause serious allergic reactions. To reduce your risk you may need to take medicine before treatment with this  medicine. Take your medicine as directed. This medicine can affect the results of blood tests to match your blood type. These changes can last for up to 6 months after the final dose. Your healthcare provider will do blood tests to match your blood type before you start treatment. Tell all of your healthcare providers that you are being treated with this medicine before receiving a blood transfusion. This medicine can affect the results of some tests used to determine treatment response; extra tests may be needed to evaluate response. Do not become pregnant while taking this medicine or for 3 months after stopping it. Women should inform their doctor if they wish to become pregnant or think they might be pregnant. There is a potential for serious side effects to an unborn child. Talk to your health care professional or pharmacist for more information. What side effects may I notice from receiving this medicine? Side effects that you should report to your doctor or health care professional as soon as possible: -allergic reactions like skin rash, itching or hives, swelling of the face, lips, or tongue -breathing problems -chills -cough -dizziness -feeling faint or lightheaded -headache -low blood counts - this medicine may decrease the number of white blood cells, red blood cells and platelets. You may be at increased risk for infections and bleeding. -nausea, vomiting -shortness of breath -signs of decreased platelets or bleeding - bruising, pinpoint red spots on the skin, black, tarry stools, blood in the urine -signs of decreased red blood cells - unusually weak or tired, feeling faint or lightheaded, falls -signs of infection - fever or chills, cough, sore throat, pain or difficulty passing urine -signs and symptoms of liver injury like dark yellow or brown urine; general ill feeling or flu-like symptoms; light-colored stools; loss of appetite; right upper belly pain; unusually weak or tired;  yellowing of the eyes or skin Side effects that usually do not require medical attention (report to your doctor or health care professional if they continue or are bothersome): -back pain -constipation -loss of appetite -diarrhea -joint pain -muscle cramps -pain, tingling, numbness in the hands or feet -swelling of the ankles, feet, hands -tiredness -trouble sleeping This list may not describe all possible side effects. Call your doctor for medical advice about side effects. You may report side effects to FDA at 1-800-FDA-1088. Where should I keep my medicine? Keep out of the reach of children. This drug is given in a hospital or clinic and will not be stored at home. NOTE: This sheet is a summary. It may not cover all possible information. If you have questions about this medicine, talk to your doctor, pharmacist, or health care provider.  2019 Elsevier/Gold Standard (2017-08-17 15:52:44)

## 2018-06-07 LAB — PROTEIN ELECTROPHORESIS, SERUM
A/G Ratio: 1.6 (ref 0.7–1.7)
Albumin ELP: 3.6 g/dL (ref 2.9–4.4)
Alpha-1-Globulin: 0.2 g/dL (ref 0.0–0.4)
Alpha-2-Globulin: 0.8 g/dL (ref 0.4–1.0)
Beta Globulin: 0.9 g/dL (ref 0.7–1.3)
Gamma Globulin: 0.3 g/dL — ABNORMAL LOW (ref 0.4–1.8)
Globulin, Total: 2.2 g/dL (ref 2.2–3.9)
M-Spike, %: 0.2 g/dL — ABNORMAL HIGH
Total Protein ELP: 5.8 g/dL — ABNORMAL LOW (ref 6.0–8.5)

## 2018-06-07 LAB — IGG, IGA, IGM
IgA: 15 mg/dL — ABNORMAL LOW (ref 87–352)
IgG (Immunoglobin G), Serum: 404 mg/dL — ABNORMAL LOW (ref 586–1602)
IgM (Immunoglobulin M), Srm: <5 mg/dL — ABNORMAL LOW (ref 26–217)

## 2018-06-07 LAB — KAPPA/LAMBDA LIGHT CHAINS
Kappa free light chain: 1.6 mg/L — ABNORMAL LOW (ref 3.3–19.4)
Kappa, lambda light chain ratio: 0.03 — ABNORMAL LOW (ref 0.26–1.65)
Lambda free light chains: 51.7 mg/L — ABNORMAL HIGH (ref 5.7–26.3)

## 2018-06-08 ENCOUNTER — Other Ambulatory Visit: Payer: Self-pay | Admitting: Hematology & Oncology

## 2018-06-08 DIAGNOSIS — C9000 Multiple myeloma not having achieved remission: Secondary | ICD-10-CM

## 2018-06-08 DIAGNOSIS — I10 Essential (primary) hypertension: Secondary | ICD-10-CM

## 2018-06-08 DIAGNOSIS — C9001 Multiple myeloma in remission: Secondary | ICD-10-CM

## 2018-06-20 ENCOUNTER — Inpatient Hospital Stay: Payer: Medicare Other

## 2018-06-20 ENCOUNTER — Other Ambulatory Visit: Payer: Self-pay

## 2018-06-20 VITALS — BP 119/74 | HR 74 | Temp 97.8°F | Resp 20

## 2018-06-20 DIAGNOSIS — Z7951 Long term (current) use of inhaled steroids: Secondary | ICD-10-CM | POA: Diagnosis not present

## 2018-06-20 DIAGNOSIS — Z5112 Encounter for antineoplastic immunotherapy: Secondary | ICD-10-CM | POA: Diagnosis not present

## 2018-06-20 DIAGNOSIS — C9001 Multiple myeloma in remission: Secondary | ICD-10-CM

## 2018-06-20 DIAGNOSIS — C9000 Multiple myeloma not having achieved remission: Secondary | ICD-10-CM | POA: Diagnosis not present

## 2018-06-20 DIAGNOSIS — D509 Iron deficiency anemia, unspecified: Secondary | ICD-10-CM | POA: Diagnosis not present

## 2018-06-20 DIAGNOSIS — C541 Malignant neoplasm of endometrium: Secondary | ICD-10-CM | POA: Diagnosis not present

## 2018-06-20 DIAGNOSIS — Z79811 Long term (current) use of aromatase inhibitors: Secondary | ICD-10-CM | POA: Diagnosis not present

## 2018-06-20 LAB — CMP (CANCER CENTER ONLY)
ALT: 15 U/L (ref 0–44)
AST: 12 U/L — ABNORMAL LOW (ref 15–41)
Albumin: 4.4 g/dL (ref 3.5–5.0)
Alkaline Phosphatase: 35 U/L — ABNORMAL LOW (ref 38–126)
Anion gap: 9 (ref 5–15)
BUN: 16 mg/dL (ref 8–23)
CO2: 25 mmol/L (ref 22–32)
Calcium: 8.9 mg/dL (ref 8.9–10.3)
Chloride: 106 mmol/L (ref 98–111)
Creatinine: 0.93 mg/dL (ref 0.44–1.00)
GFR, Est AFR Am: >60 mL/min (ref >60)
GFR, Estimated: >60 mL/min (ref >60)
Glucose, Bld: 99 mg/dL (ref 70–99)
Potassium: 3.7 mmol/L (ref 3.5–5.1)
Sodium: 140 mmol/L (ref 135–145)
Total Bilirubin: 0.3 mg/dL (ref 0.3–1.2)
Total Protein: 6 g/dL — ABNORMAL LOW (ref 6.5–8.1)

## 2018-06-20 LAB — CBC WITH DIFFERENTIAL (CANCER CENTER ONLY)
Abs Immature Granulocytes: 0.01 10*3/uL (ref 0.00–0.07)
Basophils Absolute: 0.1 10*3/uL (ref 0.0–0.1)
Basophils Relative: 2 %
Eosinophils Absolute: 0.2 10*3/uL (ref 0.0–0.5)
Eosinophils Relative: 7 %
HCT: 34.5 % — ABNORMAL LOW (ref 36.0–46.0)
Hemoglobin: 11 g/dL — ABNORMAL LOW (ref 12.0–15.0)
Immature Granulocytes: 0 %
Lymphocytes Relative: 16 %
Lymphs Abs: 0.5 10*3/uL — ABNORMAL LOW (ref 0.7–4.0)
MCH: 26.7 pg (ref 26.0–34.0)
MCHC: 31.9 g/dL (ref 30.0–36.0)
MCV: 83.7 fL (ref 80.0–100.0)
Monocytes Absolute: 0.5 10*3/uL (ref 0.1–1.0)
Monocytes Relative: 16 %
Neutro Abs: 1.9 10*3/uL (ref 1.7–7.7)
Neutrophils Relative %: 59 %
Platelet Count: 225 10*3/uL (ref 150–400)
RBC: 4.12 MIL/uL (ref 3.87–5.11)
RDW: 18.2 % — ABNORMAL HIGH (ref 11.5–15.5)
WBC Count: 3.3 10*3/uL — ABNORMAL LOW (ref 4.0–10.5)
nRBC: 0 % (ref 0.0–0.2)

## 2018-06-20 MED ORDER — SODIUM CHLORIDE 0.9% FLUSH
10.0000 mL | INTRAVENOUS | Status: DC | PRN
  Administered 2018-06-20: 10 mL
  Filled 2018-06-20: qty 10

## 2018-06-20 MED ORDER — METHYLPREDNISOLONE SODIUM SUCC 125 MG IJ SOLR
INTRAMUSCULAR | Status: AC
  Filled 2018-06-20: qty 2

## 2018-06-20 MED ORDER — SODIUM CHLORIDE 0.9 % IV SOLN
1400.0000 mg | Freq: Once | INTRAVENOUS | Status: AC
  Administered 2018-06-20: 1400 mg via INTRAVENOUS
  Filled 2018-06-20: qty 60

## 2018-06-20 MED ORDER — HEPARIN SOD (PORK) LOCK FLUSH 100 UNIT/ML IV SOLN
500.0000 [IU] | Freq: Once | INTRAVENOUS | Status: AC | PRN
  Administered 2018-06-20: 500 [IU]
  Filled 2018-06-20: qty 5

## 2018-06-20 MED ORDER — ACETAMINOPHEN 325 MG PO TABS
650.0000 mg | ORAL_TABLET | Freq: Once | ORAL | Status: AC
  Administered 2018-06-20: 650 mg via ORAL

## 2018-06-20 MED ORDER — DIPHENHYDRAMINE HCL 25 MG PO CAPS
ORAL_CAPSULE | ORAL | Status: AC
  Filled 2018-06-20: qty 2

## 2018-06-20 MED ORDER — SODIUM CHLORIDE 0.9 % IV SOLN
Freq: Once | INTRAVENOUS | Status: AC
  Administered 2018-06-20: 10:00:00 via INTRAVENOUS
  Filled 2018-06-20: qty 250

## 2018-06-20 MED ORDER — ACETAMINOPHEN 325 MG PO TABS
ORAL_TABLET | ORAL | Status: AC
  Filled 2018-06-20: qty 2

## 2018-06-20 MED ORDER — METHYLPREDNISOLONE SODIUM SUCC 125 MG IJ SOLR
100.0000 mg | Freq: Once | INTRAMUSCULAR | Status: AC
  Administered 2018-06-20: 100 mg via INTRAVENOUS

## 2018-06-20 MED ORDER — DIPHENHYDRAMINE HCL 25 MG PO CAPS
50.0000 mg | ORAL_CAPSULE | Freq: Once | ORAL | Status: AC
  Administered 2018-06-20: 50 mg via ORAL

## 2018-06-28 DIAGNOSIS — C672 Malignant neoplasm of lateral wall of bladder: Secondary | ICD-10-CM | POA: Diagnosis not present

## 2018-07-03 ENCOUNTER — Other Ambulatory Visit: Payer: Self-pay | Admitting: *Deleted

## 2018-07-03 MED ORDER — POMALIDOMIDE 1 MG PO CAPS: 1.0000 mg | ORAL_CAPSULE | Freq: Every day | ORAL | 0 refills | Status: DC

## 2018-07-04 ENCOUNTER — Other Ambulatory Visit: Payer: Self-pay

## 2018-07-04 ENCOUNTER — Inpatient Hospital Stay: Payer: Medicare Other | Attending: Hematology & Oncology

## 2018-07-04 ENCOUNTER — Other Ambulatory Visit: Payer: Self-pay | Admitting: *Deleted

## 2018-07-04 ENCOUNTER — Inpatient Hospital Stay: Payer: Medicare Other

## 2018-07-04 ENCOUNTER — Inpatient Hospital Stay (HOSPITAL_BASED_OUTPATIENT_CLINIC_OR_DEPARTMENT_OTHER): Payer: Medicare Other | Admitting: Hematology & Oncology

## 2018-07-04 ENCOUNTER — Encounter: Payer: Self-pay | Admitting: Hematology & Oncology

## 2018-07-04 VITALS — BP 136/72 | HR 93 | Temp 98.5°F | Resp 17 | Wt 192.0 lb

## 2018-07-04 DIAGNOSIS — Z5112 Encounter for antineoplastic immunotherapy: Secondary | ICD-10-CM | POA: Diagnosis not present

## 2018-07-04 DIAGNOSIS — D509 Iron deficiency anemia, unspecified: Secondary | ICD-10-CM | POA: Insufficient documentation

## 2018-07-04 DIAGNOSIS — Z7982 Long term (current) use of aspirin: Secondary | ICD-10-CM | POA: Insufficient documentation

## 2018-07-04 DIAGNOSIS — Z79811 Long term (current) use of aromatase inhibitors: Secondary | ICD-10-CM | POA: Diagnosis not present

## 2018-07-04 DIAGNOSIS — C541 Malignant neoplasm of endometrium: Secondary | ICD-10-CM | POA: Diagnosis not present

## 2018-07-04 DIAGNOSIS — Z79899 Other long term (current) drug therapy: Secondary | ICD-10-CM

## 2018-07-04 DIAGNOSIS — D500 Iron deficiency anemia secondary to blood loss (chronic): Secondary | ICD-10-CM

## 2018-07-04 DIAGNOSIS — C9002 Multiple myeloma in relapse: Secondary | ICD-10-CM

## 2018-07-04 DIAGNOSIS — C9001 Multiple myeloma in remission: Secondary | ICD-10-CM

## 2018-07-04 DIAGNOSIS — C9000 Multiple myeloma not having achieved remission: Secondary | ICD-10-CM | POA: Diagnosis not present

## 2018-07-04 DIAGNOSIS — I10 Essential (primary) hypertension: Secondary | ICD-10-CM | POA: Diagnosis not present

## 2018-07-04 LAB — CMP (CANCER CENTER ONLY)
ALT: 13 U/L (ref 0–44)
AST: 10 U/L — ABNORMAL LOW (ref 15–41)
Albumin: 4.6 g/dL (ref 3.5–5.0)
Alkaline Phosphatase: 43 U/L (ref 38–126)
Anion gap: 10 (ref 5–15)
BUN: 13 mg/dL (ref 8–23)
CO2: 27 mmol/L (ref 22–32)
Calcium: 9.8 mg/dL (ref 8.9–10.3)
Chloride: 101 mmol/L (ref 98–111)
Creatinine: 0.97 mg/dL (ref 0.44–1.00)
GFR, Est AFR Am: >60 mL/min (ref >60)
GFR, Estimated: >60 mL/min (ref >60)
Glucose, Bld: 114 mg/dL — ABNORMAL HIGH (ref 70–99)
Potassium: 3.4 mmol/L — ABNORMAL LOW (ref 3.5–5.1)
Sodium: 138 mmol/L (ref 135–145)
Total Bilirubin: 0.5 mg/dL (ref 0.3–1.2)
Total Protein: 7.1 g/dL (ref 6.5–8.1)

## 2018-07-04 LAB — CBC WITH DIFFERENTIAL (CANCER CENTER ONLY)
Abs Immature Granulocytes: 0.02 10*3/uL (ref 0.00–0.07)
Basophils Absolute: 0.1 10*3/uL (ref 0.0–0.1)
Basophils Relative: 2 %
Eosinophils Absolute: 0.1 10*3/uL (ref 0.0–0.5)
Eosinophils Relative: 2 %
HCT: 35.8 % — ABNORMAL LOW (ref 36.0–46.0)
Hemoglobin: 11.5 g/dL — ABNORMAL LOW (ref 12.0–15.0)
Immature Granulocytes: 0 %
Lymphocytes Relative: 12 %
Lymphs Abs: 0.8 10*3/uL (ref 0.7–4.0)
MCH: 26.9 pg (ref 26.0–34.0)
MCHC: 32.1 g/dL (ref 30.0–36.0)
MCV: 83.6 fL (ref 80.0–100.0)
Monocytes Absolute: 0.8 10*3/uL (ref 0.1–1.0)
Monocytes Relative: 12 %
Neutro Abs: 5 10*3/uL (ref 1.7–7.7)
Neutrophils Relative %: 72 %
Platelet Count: 252 10*3/uL (ref 150–400)
RBC: 4.28 MIL/uL (ref 3.87–5.11)
RDW: 16.9 % — ABNORMAL HIGH (ref 11.5–15.5)
WBC Count: 6.9 10*3/uL (ref 4.0–10.5)
nRBC: 0 % (ref 0.0–0.2)

## 2018-07-04 LAB — LACTATE DEHYDROGENASE: LDH: 192 U/L (ref 98–192)

## 2018-07-04 MED ORDER — ACETAMINOPHEN 325 MG PO TABS
ORAL_TABLET | ORAL | Status: AC
  Filled 2018-07-04: qty 2

## 2018-07-04 MED ORDER — SODIUM CHLORIDE 0.9 % IV SOLN
16.5000 mg/kg | Freq: Once | INTRAVENOUS | Status: AC
  Administered 2018-07-04: 1400 mg via INTRAVENOUS
  Filled 2018-07-04: qty 60

## 2018-07-04 MED ORDER — METHYLPREDNISOLONE SODIUM SUCC 125 MG IJ SOLR
INTRAMUSCULAR | Status: AC
  Filled 2018-07-04: qty 2

## 2018-07-04 MED ORDER — SODIUM CHLORIDE 0.9 % IV SOLN
Freq: Once | INTRAVENOUS | Status: AC
  Administered 2018-07-04: 10:00:00 via INTRAVENOUS
  Filled 2018-07-04: qty 250

## 2018-07-04 MED ORDER — SODIUM CHLORIDE 0.9% FLUSH
10.0000 mL | INTRAVENOUS | Status: DC | PRN
  Administered 2018-07-04: 10 mL
  Filled 2018-07-04: qty 10

## 2018-07-04 MED ORDER — DIPHENHYDRAMINE HCL 25 MG PO CAPS
50.0000 mg | ORAL_CAPSULE | Freq: Once | ORAL | Status: AC
  Administered 2018-07-04: 50 mg via ORAL

## 2018-07-04 MED ORDER — POMALIDOMIDE 1 MG PO CAPS: 1.0000 mg | ORAL_CAPSULE | Freq: Every day | ORAL | 0 refills | Status: DC

## 2018-07-04 MED ORDER — DIPHENHYDRAMINE HCL 25 MG PO CAPS
ORAL_CAPSULE | ORAL | Status: AC
  Filled 2018-07-04: qty 2

## 2018-07-04 MED ORDER — METHYLPREDNISOLONE SODIUM SUCC 125 MG IJ SOLR
100.0000 mg | Freq: Once | INTRAMUSCULAR | Status: AC
  Administered 2018-07-04: 10:00:00 100 mg via INTRAVENOUS

## 2018-07-04 MED ORDER — HEPARIN SOD (PORK) LOCK FLUSH 100 UNIT/ML IV SOLN
500.0000 [IU] | Freq: Once | INTRAVENOUS | Status: AC | PRN
  Administered 2018-07-04: 500 [IU]
  Filled 2018-07-04: qty 5

## 2018-07-04 MED ORDER — ACETAMINOPHEN 325 MG PO TABS
650.0000 mg | ORAL_TABLET | Freq: Once | ORAL | Status: AC
  Administered 2018-07-04: 650 mg via ORAL

## 2018-07-04 NOTE — Patient Instructions (Signed)

## 2018-07-04 NOTE — Progress Notes (Signed)
Hematology and Oncology Follow Up Visit  Emily Greer [id number] [birth year] 68 y.o. 07/04/2018   Principle Diagnosis:  Recurrent lambda light chain myeloma History of recurrent endometrial carcinoma Iron deficiency anemia-blood loss  PastTherapy: Status post second autologous stem cell transplant on 07/24/2014 Maintenance therapy with Pomalidomide/every 2 week Velcade - d/c'ed Xgeva 120 mg subcutaneous every 3months -next dose inNovember2019 Radiation therapy for endometrial recurrence - completed 04/20/2015 Pomalyst/Kyprolis 70mg /m2 IV q 2 weeks - s/p cycle #12 - held on 12/26/2017 for hematuria  Current Therapy:   Daratumumab/Pomalyst (1 mg) - status post cycle #4 Femara 2.5 mg po q day IV iron as indicated   Interim History:  Emily Greer is here today for follow-up.  She is doing okay.  She has had no problems so far with the daratumumab or pomalidomide.  She now has a new grandson.  Her second grandchild was born up in [place].  He came about 2 months early.  His name is Emily Greer.  This is a very strong name.  As far as her myeloma is concerned, her last lambda light chain was holding steady at 5.1 mg/dL.  I am get a 24-hour urine on her today to see how this looks.  He has had no cough.  She has had no fever.  There is been no change in bowel or bladder habits.  There is been no bleeding.  She is on Femara for the uterine cancer.  This, so far, has not been an issue.    Overall, her performance status is ECOG 1.    Medications:  Allergies as of 07/04/2018      Reactions   Codeine Nausea Only      Medication List       Accurate as of July 04, 2018  9:02 AM. If you have any questions, ask your nurse or doctor.        acyclovir 400 MG tablet Commonly known as:  ZOVIRAX Take 1 tablet (400 mg total) by mouth 2 (two) times daily.   albuterol 108 (90 Base) MCG/ACT inhaler Commonly known as:  VENTOLIN HFA Inhale 2 puffs into the  lungs every 6 (six) hours as needed for wheezing or shortness of breath. 2 puffs 3 times daily x 5 days then every 6 hours as needed.   amLODipine 10 MG tablet Commonly known as:  NORVASC TAKE 1 TABLET BY MOUTH EVERY MORNING   aspirin EC 81 MG tablet Take 81 mg by mouth 2 (two) times daily. Stopped  By dr Emily Greer until urology procedures complete   letrozole 2.5 MG tablet Commonly known as:  FEMARA Take 1 tablet (2.5 mg total) by mouth daily.   loperamide 2 MG capsule Commonly known as:  IMODIUM Take by mouth as needed for diarrhea or loose stools. Reported on 08/19/2015   loratadine 10 MG tablet Commonly known as:  CLARITIN Take 10 mg by mouth every morning. Reported on 08/19/2015   LORazepam 0.5 MG tablet Commonly known as:  Ativan Take 1 tablet (0.5 mg total) by mouth every 6 (six) hours as needed (Nausea or vomiting).   montelukast 10 MG tablet Commonly known as:  SINGULAIR TAKE 1 TABLET(10 MG) BY MOUTH AT BEDTIME   Nasacort Allergy 24HR 55 MCG/ACT Aero nasal inhaler Generic drug:  triamcinolone Place 2 sprays into the nose as needed.   pomalidomide 1 MG capsule Commonly known as:  POMALYST Take 1 capsule (1 mg total) by mouth daily. Take with water on days 1-21. Repeat every  28 days. Authorization number [id number] What changed:  additional instructions Changed by:  Emily Leaf, RN   potassium chloride SA 20 MEQ tablet Commonly known as:  K-DUR TAKE 1 TABLET(20 MEQ) BY MOUTH TWICE DAILY   traMADol 50 MG tablet Commonly known as:  Ultram Take 1 tablet (50 mg total) by mouth every 6 (six) hours as needed.   triamterene-hydrochlorothiazide 37.5-25 MG tablet Commonly known as:  MAXZIDE-25 1 tablet by mouth at bedtime   Vitamin D3 50 MCG (2000 UT) Tabs Take 2 tablets by mouth daily.       Allergies:  Allergies  Allergen Reactions  . Codeine Nausea Only    Past Medical History, Surgical history, Social history, and Family History were reviewed and updated.   Review of Systems: Review of Systems  Constitutional: Negative.   HENT: Negative.   Eyes: Negative.   Respiratory: Negative.   Cardiovascular: Negative.   Gastrointestinal: Negative.   Genitourinary: Negative.   Musculoskeletal: Negative.   Skin: Negative.   Neurological: Negative.   Endo/Heme/Allergies: Negative.   Psychiatric/Behavioral: Negative.      Physical Exam:  weight is 192 lb (87.1 kg). Her oral temperature is 98.5 F (36.9 C). Her blood pressure is 136/72 and her pulse is 93. Her respiration is 17 and oxygen saturation is 100%.   Wt Readings from Last 3 Encounters:  07/04/18 192 lb (87.1 kg)  06/06/18 198 lb (89.8 kg)  05/09/18 189 lb 8 oz (86 kg)    Physical Exam Vitals signs reviewed.  HENT:     Head: Normocephalic and atraumatic.  Eyes:     Pupils: Pupils are equal, round, and reactive to light.  Neck:     Musculoskeletal: Normal range of motion.  Cardiovascular:     Rate and Rhythm: Normal rate and regular rhythm.     Heart sounds: Normal heart sounds.  Pulmonary:     Effort: Pulmonary effort is normal.     Breath sounds: Normal breath sounds.  Abdominal:     General: Bowel sounds are normal.     Palpations: Abdomen is soft.  Musculoskeletal: Normal range of motion.        General: No tenderness or deformity.  Lymphadenopathy:     Cervical: No cervical adenopathy.  Skin:    General: Skin is warm and dry.     Findings: No erythema or rash.  Neurological:     Mental Status: She is alert and oriented to person, place, and time.  Psychiatric:        Behavior: Behavior normal.        Thought Content: Thought content normal.        Judgment: Judgment normal.      Lab Results  Component Value Date   WBC 6.9 07/04/2018   HGB 11.5 (L) 07/04/2018   HCT 35.8 (L) 07/04/2018   MCV 83.6 07/04/2018   PLT 252 07/04/2018   Lab Results  Component Value Date   FERRITIN 630 (H) 04/11/2018   IRON 63 04/11/2018   TIBC 359 04/11/2018   UIBC 295  04/11/2018   IRONPCTSAT 18 (L) 04/11/2018   Lab Results  Component Value Date   RETICCTPCT 2.9 (H) 10/17/2017   RBC 4.28 07/04/2018   Lab Results  Component Value Date   KPAFRELGTCHN 1.6 (L) 06/06/2018   LAMBDASER 51.7 (H) 06/06/2018   KAPLAMBRATIO 0.03 (L) 06/06/2018   Lab Results  Component Value Date   IGGSERUM 404 (L) 06/06/2018   IGA 15 (L) 06/06/2018   IGMSERUM <  5 (L) 06/06/2018   Lab Results  Component Value Date   TOTALPROTELP 5.8 (L) 06/06/2018   ALBUMINELP 3.6 06/06/2018   A1GS 0.2 06/06/2018   A2GS 0.8 06/06/2018   BETS 0.9 06/06/2018   BETA2SER 0.4 11/23/2014   GAMS 0.3 (L) 06/06/2018   MSPIKE 0.2 (H) 06/06/2018   SPEI Comment 06/06/2018     Chemistry      Component Value Date/Time   NA 140 06/20/2018 0900   NA 141 01/10/2017 1115   NA 140 06/21/2016 0918   K 3.7 06/20/2018 0900   K 4.0 01/10/2017 1115   K 4.3 06/21/2016 0918   CL 106 06/20/2018 0900   CL 106 01/10/2017 1115   CO2 25 06/20/2018 0900   CO2 27 01/10/2017 1115   CO2 20 (L) 06/21/2016 0918   BUN 16 06/20/2018 0900   BUN 15 01/10/2017 1115   BUN 15.8 06/21/2016 0918   CREATININE 0.93 06/20/2018 0900   CREATININE 1.0 01/10/2017 1115   CREATININE 0.8 06/21/2016 0918      Component Value Date/Time   CALCIUM 8.9 06/20/2018 0900   CALCIUM 9.5 01/10/2017 1115   CALCIUM 9.4 06/21/2016 0918   ALKPHOS 35 (L) 06/20/2018 0900   ALKPHOS 40 01/10/2017 1115   ALKPHOS 66 06/21/2016 0918   AST 12 (L) 06/20/2018 0900   AST 17 06/21/2016 0918   ALT 15 06/20/2018 0900   ALT 19 01/10/2017 1115   ALT 37 06/21/2016 0918   BILITOT 0.3 06/20/2018 0900   BILITOT 0.32 06/21/2016 0918       Impression and Plan: Ms. Patt is a very pleasant 68 yo African American female with recurrent lambda light chain myeloma.   Her second a stem cell transplant for light chain myeloma was in June 2016.   She also has localized recurrent endometrial cancer (followed by gyn onc Dr. Nelly Rout).  She is doing  well with this.  We will have to see how her lambda light chains look.  This will always be an indicator for any type of therapy change.  If there is an issue, we could certainly utilize CAR-T therapy.  I think she would be a good candidate for this.  We will plan to get her back in another month.  She gets her next dose of daratumumab in 2 weeks.     Josph Macho, MD 6/4/20209:02 AM

## 2018-07-05 LAB — KAPPA/LAMBDA LIGHT CHAINS
Kappa free light chain: 4.7 mg/L (ref 3.3–19.4)
Kappa, lambda light chain ratio: 0.08 — ABNORMAL LOW (ref 0.26–1.65)
Lambda free light chains: 56.7 mg/L — ABNORMAL HIGH (ref 5.7–26.3)

## 2018-07-05 LAB — IGG, IGA, IGM
IgA: 17 mg/dL — ABNORMAL LOW (ref 87–352)
IgG (Immunoglobin G), Serum: 409 mg/dL — ABNORMAL LOW (ref 586–1602)
IgM (Immunoglobulin M), Srm: <5 mg/dL — ABNORMAL LOW (ref 26–217)

## 2018-07-09 LAB — PROTEIN ELECTROPHORESIS, SERUM, WITH REFLEX
A/G Ratio: 1.4 (ref 0.7–1.7)
Albumin ELP: 3.8 g/dL (ref 2.9–4.4)
Alpha-1-Globulin: 0.3 g/dL (ref 0.0–0.4)
Alpha-2-Globulin: 1.1 g/dL — ABNORMAL HIGH (ref 0.4–1.0)
Beta Globulin: 1.1 g/dL (ref 0.7–1.3)
Gamma Globulin: 0.3 g/dL — ABNORMAL LOW (ref 0.4–1.8)
Globulin, Total: 2.8 g/dL (ref 2.2–3.9)
M-Spike, %: 0.1 g/dL — ABNORMAL HIGH
SPEP Interpretation: 0
Total Protein ELP: 6.6 g/dL (ref 6.0–8.5)

## 2018-07-09 LAB — IMMUNOFIXATION REFLEX, SERUM
IgA: 19 mg/dL — ABNORMAL LOW (ref 87–352)
IgG (Immunoglobin G), Serum: 487 mg/dL — ABNORMAL LOW (ref 586–1602)
IgM (Immunoglobulin M), Srm: <5 mg/dL — ABNORMAL LOW (ref 26–217)

## 2018-07-10 ENCOUNTER — Inpatient Hospital Stay: Payer: Medicare Other

## 2018-07-10 DIAGNOSIS — I10 Essential (primary) hypertension: Secondary | ICD-10-CM | POA: Diagnosis not present

## 2018-07-10 DIAGNOSIS — Z79811 Long term (current) use of aromatase inhibitors: Secondary | ICD-10-CM | POA: Diagnosis not present

## 2018-07-10 DIAGNOSIS — C9000 Multiple myeloma not having achieved remission: Secondary | ICD-10-CM

## 2018-07-10 DIAGNOSIS — D509 Iron deficiency anemia, unspecified: Secondary | ICD-10-CM | POA: Diagnosis not present

## 2018-07-10 DIAGNOSIS — Z5112 Encounter for antineoplastic immunotherapy: Secondary | ICD-10-CM | POA: Diagnosis not present

## 2018-07-10 DIAGNOSIS — C541 Malignant neoplasm of endometrium: Secondary | ICD-10-CM | POA: Diagnosis not present

## 2018-07-12 LAB — UPEP/UIFE/LIGHT CHAINS/TP, 24-HR UR
% BETA, Urine: 0 %
ALPHA 1 URINE: 0 %
Albumin, U: 100 %
Alpha 2, Urine: 0 %
Free Kappa Lt Chains,Ur: 2.05 mg/L (ref 0.63–113.79)
Free Kappa/Lambda Ratio: 0.09 — ABNORMAL LOW (ref 1.03–31.76)
Free Lambda Lt Chains,Ur: 23.26 mg/L — ABNORMAL HIGH (ref 0.47–11.77)
GAMMA GLOBULIN URINE: 0 %
Total Protein, Urine-Ur/day: <60 mg/24 hr (ref 30–150)
Total Protein, Urine: <4 mg/dL
Total Volume: 1500

## 2018-07-18 ENCOUNTER — Other Ambulatory Visit: Payer: Self-pay

## 2018-07-18 ENCOUNTER — Inpatient Hospital Stay: Payer: Medicare Other

## 2018-07-18 ENCOUNTER — Other Ambulatory Visit: Payer: Self-pay | Admitting: *Deleted

## 2018-07-18 VITALS — BP 158/80 | HR 55 | Temp 97.7°F | Resp 18

## 2018-07-18 DIAGNOSIS — Z79811 Long term (current) use of aromatase inhibitors: Secondary | ICD-10-CM | POA: Diagnosis not present

## 2018-07-18 DIAGNOSIS — C9001 Multiple myeloma in remission: Secondary | ICD-10-CM

## 2018-07-18 DIAGNOSIS — C9000 Multiple myeloma not having achieved remission: Secondary | ICD-10-CM | POA: Diagnosis not present

## 2018-07-18 DIAGNOSIS — C541 Malignant neoplasm of endometrium: Secondary | ICD-10-CM | POA: Diagnosis not present

## 2018-07-18 DIAGNOSIS — Z5112 Encounter for antineoplastic immunotherapy: Secondary | ICD-10-CM | POA: Diagnosis not present

## 2018-07-18 DIAGNOSIS — I10 Essential (primary) hypertension: Secondary | ICD-10-CM | POA: Diagnosis not present

## 2018-07-18 DIAGNOSIS — D509 Iron deficiency anemia, unspecified: Secondary | ICD-10-CM | POA: Diagnosis not present

## 2018-07-18 LAB — CMP (CANCER CENTER ONLY)
ALT: 14 U/L (ref 0–44)
AST: 11 U/L — ABNORMAL LOW (ref 15–41)
Albumin: 4.2 g/dL (ref 3.5–5.0)
Alkaline Phosphatase: 34 U/L — ABNORMAL LOW (ref 38–126)
Anion gap: 9 (ref 5–15)
BUN: 14 mg/dL (ref 8–23)
CO2: 25 mmol/L (ref 22–32)
Calcium: 9.1 mg/dL (ref 8.9–10.3)
Chloride: 107 mmol/L (ref 98–111)
Creatinine: 0.94 mg/dL (ref 0.44–1.00)
GFR, Est AFR Am: >60 mL/min (ref >60)
GFR, Estimated: >60 mL/min (ref >60)
Glucose, Bld: 87 mg/dL (ref 70–99)
Potassium: 3.7 mmol/L (ref 3.5–5.1)
Sodium: 141 mmol/L (ref 135–145)
Total Bilirubin: 0.4 mg/dL (ref 0.3–1.2)
Total Protein: 6 g/dL — ABNORMAL LOW (ref 6.5–8.1)

## 2018-07-18 LAB — CBC WITH DIFFERENTIAL (CANCER CENTER ONLY)
Abs Immature Granulocytes: 0.02 10*3/uL (ref 0.00–0.07)
Basophils Absolute: 0.1 10*3/uL (ref 0.0–0.1)
Basophils Relative: 2 %
Eosinophils Absolute: 0.2 10*3/uL (ref 0.0–0.5)
Eosinophils Relative: 4 %
HCT: 33 % — ABNORMAL LOW (ref 36.0–46.0)
Hemoglobin: 10.5 g/dL — ABNORMAL LOW (ref 12.0–15.0)
Immature Granulocytes: 1 %
Lymphocytes Relative: 19 %
Lymphs Abs: 0.7 10*3/uL (ref 0.7–4.0)
MCH: 26.8 pg (ref 26.0–34.0)
MCHC: 31.8 g/dL (ref 30.0–36.0)
MCV: 84.2 fL (ref 80.0–100.0)
Monocytes Absolute: 0.4 10*3/uL (ref 0.1–1.0)
Monocytes Relative: 10 %
Neutro Abs: 2.4 10*3/uL (ref 1.7–7.7)
Neutrophils Relative %: 64 %
Platelet Count: 244 10*3/uL (ref 150–400)
RBC: 3.92 MIL/uL (ref 3.87–5.11)
RDW: 16.1 % — ABNORMAL HIGH (ref 11.5–15.5)
WBC Count: 3.7 10*3/uL — ABNORMAL LOW (ref 4.0–10.5)
nRBC: 0 % (ref 0.0–0.2)

## 2018-07-18 MED ORDER — ACETAMINOPHEN 325 MG PO TABS
ORAL_TABLET | ORAL | Status: AC
  Filled 2018-07-18: qty 2

## 2018-07-18 MED ORDER — METHYLPREDNISOLONE SODIUM SUCC 125 MG IJ SOLR
INTRAMUSCULAR | Status: AC
  Filled 2018-07-18: qty 2

## 2018-07-18 MED ORDER — SODIUM CHLORIDE 0.9 % IV SOLN
16.5000 mg/kg | Freq: Once | INTRAVENOUS | Status: AC
  Administered 2018-07-18: 12:00:00 1400 mg via INTRAVENOUS
  Filled 2018-07-18: qty 10

## 2018-07-18 MED ORDER — SODIUM CHLORIDE 0.9% FLUSH
10.0000 mL | INTRAVENOUS | Status: DC | PRN
  Administered 2018-07-18: 13:00:00 10 mL
  Filled 2018-07-18: qty 10

## 2018-07-18 MED ORDER — HEPARIN SOD (PORK) LOCK FLUSH 100 UNIT/ML IV SOLN
500.0000 [IU] | Freq: Once | INTRAVENOUS | Status: AC | PRN
  Administered 2018-07-18: 500 [IU]
  Filled 2018-07-18: qty 5

## 2018-07-18 MED ORDER — METHYLPREDNISOLONE SODIUM SUCC 125 MG IJ SOLR
100.0000 mg | Freq: Once | INTRAMUSCULAR | Status: AC
  Administered 2018-07-18: 11:00:00 100 mg via INTRAVENOUS

## 2018-07-18 MED ORDER — POMALIDOMIDE 1 MG PO CAPS: 1.0000 mg | ORAL_CAPSULE | Freq: Every day | ORAL | 0 refills | Status: DC

## 2018-07-18 MED ORDER — SODIUM CHLORIDE 0.9 % IV SOLN
Freq: Once | INTRAVENOUS | Status: AC
  Administered 2018-07-18: 10:00:00 via INTRAVENOUS
  Filled 2018-07-18: qty 250

## 2018-07-18 MED ORDER — DIPHENHYDRAMINE HCL 25 MG PO CAPS
ORAL_CAPSULE | ORAL | Status: AC
  Filled 2018-07-18: qty 2

## 2018-07-18 MED ORDER — ACETAMINOPHEN 325 MG PO TABS
650.0000 mg | ORAL_TABLET | Freq: Once | ORAL | Status: AC
  Administered 2018-07-18: 650 mg via ORAL

## 2018-07-18 MED ORDER — DIPHENHYDRAMINE HCL 25 MG PO CAPS
50.0000 mg | ORAL_CAPSULE | Freq: Once | ORAL | Status: AC
  Administered 2018-07-18: 50 mg via ORAL

## 2018-07-18 NOTE — Patient Instructions (Signed)
Daratumumab injection What is this medicine? DARATUMUMAB (dar a toom ue mab) is a monoclonal antibody. It is used to treat multiple myeloma. This medicine may be used for other purposes; ask your health care provider or pharmacist if you have questions. COMMON BRAND NAME(S): DARZALEX What should I tell my health care provider before I take this medicine? They need to know if you have any of these conditions: -infection (especially a virus infection such as chickenpox, herpes, or hepatitis B virus) -lung or breathing disease -an unusual or allergic reaction to daratumumab, other medicines, foods, dyes, or preservatives -pregnant or trying to get pregnant -breast-feeding How should I use this medicine? This medicine is for infusion into a vein. It is given by a health care professional in a hospital or clinic setting. Talk to your pediatrician regarding the use of this medicine in children. Special care may be needed. Overdosage: If you think you have taken too much of this medicine contact a poison control center or emergency room at once. NOTE: This medicine is only for you. Do not share this medicine with others. What if I miss a dose? Keep appointments for follow-up doses as directed. It is important not to miss your dose. Call your doctor or health care professional if you are unable to keep an appointment. What may interact with this medicine? Interactions have not been studied. Give your health care provider a list of all the medicines, herbs, non-prescription drugs, or dietary supplements you use. Also tell them if you smoke, drink alcohol, or use illegal drugs. Some items may interact with your medicine. This list may not describe all possible interactions. Give your health care provider a list of all the medicines, herbs, non-prescription drugs, or dietary supplements you use. Also tell them if you smoke, drink alcohol, or use illegal drugs. Some items may interact with your  medicine. What should I watch for while using this medicine? This drug may make you feel generally unwell. Report any side effects. Continue your course of treatment even though you feel ill unless your doctor tells you to stop. This medicine can cause serious allergic reactions. To reduce your risk you may need to take medicine before treatment with this medicine. Take your medicine as directed. This medicine can affect the results of blood tests to match your blood type. These changes can last for up to 6 months after the final dose. Your healthcare provider will do blood tests to match your blood type before you start treatment. Tell all of your healthcare providers that you are being treated with this medicine before receiving a blood transfusion. This medicine can affect the results of some tests used to determine treatment response; extra tests may be needed to evaluate response. Do not become pregnant while taking this medicine or for 3 months after stopping it. Women should inform their doctor if they wish to become pregnant or think they might be pregnant. There is a potential for serious side effects to an unborn child. Talk to your health care professional or pharmacist for more information. What side effects may I notice from receiving this medicine? Side effects that you should report to your doctor or health care professional as soon as possible: -allergic reactions like skin rash, itching or hives, swelling of the face, lips, or tongue -breathing problems -chills -cough -dizziness -feeling faint or lightheaded -headache -low blood counts - this medicine may decrease the number of white blood cells, red blood cells and platelets. You may be at increased   risk for infections and bleeding. -nausea, vomiting -shortness of breath -signs of decreased platelets or bleeding - bruising, pinpoint red spots on the skin, black, tarry stools, blood in the urine -signs of decreased red blood  cells - unusually weak or tired, feeling faint or lightheaded, falls -signs of infection - fever or chills, cough, sore throat, pain or difficulty passing urine -signs and symptoms of liver injury like dark yellow or brown urine; general ill feeling or flu-like symptoms; light-colored stools; loss of appetite; right upper belly pain; unusually weak or tired; yellowing of the eyes or skin Side effects that usually do not require medical attention (report to your doctor or health care professional if they continue or are bothersome): -back pain -constipation -loss of appetite -diarrhea -joint pain -muscle cramps -pain, tingling, numbness in the hands or feet -swelling of the ankles, feet, hands -tiredness -trouble sleeping This list may not describe all possible side effects. Call your doctor for medical advice about side effects. You may report side effects to FDA at 1-800-FDA-1088. Where should I keep my medicine? Keep out of the reach of children. This drug is given in a hospital or clinic and will not be stored at home. NOTE: This sheet is a summary. It may not cover all possible information. If you have questions about this medicine, talk to your doctor, pharmacist, or health care provider.  2019 Elsevier/Gold Standard (2017-08-17 15:52:44)

## 2018-07-25 ENCOUNTER — Telehealth: Payer: Self-pay | Admitting: *Deleted

## 2018-07-25 NOTE — Telephone Encounter (Signed)
Patient notified that the urine looks great and that the lamda light chain went from 450 down to 23.  Patient appreciative of call and has no questions or concerns at this time.

## 2018-08-01 ENCOUNTER — Inpatient Hospital Stay: Payer: Medicare Other

## 2018-08-01 ENCOUNTER — Other Ambulatory Visit: Payer: Self-pay

## 2018-08-01 ENCOUNTER — Inpatient Hospital Stay: Payer: Medicare Other | Attending: Hematology & Oncology | Admitting: Hematology & Oncology

## 2018-08-01 ENCOUNTER — Encounter: Payer: Self-pay | Admitting: Hematology & Oncology

## 2018-08-01 VITALS — BP 157/78 | HR 84 | Temp 98.0°F | Resp 16 | Wt 196.0 lb

## 2018-08-01 VITALS — BP 151/89 | HR 68

## 2018-08-01 DIAGNOSIS — Z79811 Long term (current) use of aromatase inhibitors: Secondary | ICD-10-CM | POA: Insufficient documentation

## 2018-08-01 DIAGNOSIS — Z79899 Other long term (current) drug therapy: Secondary | ICD-10-CM

## 2018-08-01 DIAGNOSIS — C9002 Multiple myeloma in relapse: Secondary | ICD-10-CM

## 2018-08-01 DIAGNOSIS — D500 Iron deficiency anemia secondary to blood loss (chronic): Secondary | ICD-10-CM | POA: Diagnosis not present

## 2018-08-01 DIAGNOSIS — D509 Iron deficiency anemia, unspecified: Secondary | ICD-10-CM

## 2018-08-01 DIAGNOSIS — Z7982 Long term (current) use of aspirin: Secondary | ICD-10-CM | POA: Insufficient documentation

## 2018-08-01 DIAGNOSIS — I10 Essential (primary) hypertension: Secondary | ICD-10-CM | POA: Diagnosis not present

## 2018-08-01 DIAGNOSIS — Z5112 Encounter for antineoplastic immunotherapy: Secondary | ICD-10-CM | POA: Insufficient documentation

## 2018-08-01 DIAGNOSIS — C9001 Multiple myeloma in remission: Secondary | ICD-10-CM

## 2018-08-01 DIAGNOSIS — C541 Malignant neoplasm of endometrium: Secondary | ICD-10-CM

## 2018-08-01 DIAGNOSIS — C9000 Multiple myeloma not having achieved remission: Secondary | ICD-10-CM | POA: Diagnosis not present

## 2018-08-01 LAB — IRON AND TIBC
Iron: 60 ug/dL (ref 41–142)
Saturation Ratios: 16 % — ABNORMAL LOW (ref 21–57)
TIBC: 363 ug/dL (ref 236–444)
UIBC: 303 ug/dL (ref 120–384)

## 2018-08-01 LAB — CMP (CANCER CENTER ONLY)
ALT: 13 U/L (ref 0–44)
AST: 11 U/L — ABNORMAL LOW (ref 15–41)
Albumin: 4.2 g/dL (ref 3.5–5.0)
Alkaline Phosphatase: 38 U/L (ref 38–126)
Anion gap: 10 (ref 5–15)
BUN: 12 mg/dL (ref 8–23)
CO2: 25 mmol/L (ref 22–32)
Calcium: 9.3 mg/dL (ref 8.9–10.3)
Chloride: 106 mmol/L (ref 98–111)
Creatinine: 0.82 mg/dL (ref 0.44–1.00)
GFR, Est AFR Am: >60 mL/min (ref >60)
GFR, Estimated: >60 mL/min (ref >60)
Glucose, Bld: 97 mg/dL (ref 70–99)
Potassium: 3.5 mmol/L (ref 3.5–5.1)
Sodium: 141 mmol/L (ref 135–145)
Total Bilirubin: 0.3 mg/dL (ref 0.3–1.2)
Total Protein: 6 g/dL — ABNORMAL LOW (ref 6.5–8.1)

## 2018-08-01 LAB — CBC WITH DIFFERENTIAL (CANCER CENTER ONLY)
Abs Immature Granulocytes: 0.01 10*3/uL (ref 0.00–0.07)
Basophils Absolute: 0.1 10*3/uL (ref 0.0–0.1)
Basophils Relative: 2 %
Eosinophils Absolute: 0.2 10*3/uL (ref 0.0–0.5)
Eosinophils Relative: 6 %
HCT: 33.9 % — ABNORMAL LOW (ref 36.0–46.0)
Hemoglobin: 10.8 g/dL — ABNORMAL LOW (ref 12.0–15.0)
Immature Granulocytes: 0 %
Lymphocytes Relative: 18 %
Lymphs Abs: 0.6 10*3/uL — ABNORMAL LOW (ref 0.7–4.0)
MCH: 26.9 pg (ref 26.0–34.0)
MCHC: 31.9 g/dL (ref 30.0–36.0)
MCV: 84.3 fL (ref 80.0–100.0)
Monocytes Absolute: 0.6 10*3/uL (ref 0.1–1.0)
Monocytes Relative: 18 %
Neutro Abs: 1.9 10*3/uL (ref 1.7–7.7)
Neutrophils Relative %: 56 %
Platelet Count: 210 10*3/uL (ref 150–400)
RBC: 4.02 MIL/uL (ref 3.87–5.11)
RDW: 15.9 % — ABNORMAL HIGH (ref 11.5–15.5)
WBC Count: 3.4 10*3/uL — ABNORMAL LOW (ref 4.0–10.5)
nRBC: 0 % (ref 0.0–0.2)

## 2018-08-01 LAB — FERRITIN: Ferritin: 438 ng/mL — ABNORMAL HIGH (ref 11–307)

## 2018-08-01 MED ORDER — HEPARIN SOD (PORK) LOCK FLUSH 100 UNIT/ML IV SOLN
500.0000 [IU] | Freq: Once | INTRAVENOUS | Status: AC | PRN
  Administered 2018-08-01: 500 [IU]
  Filled 2018-08-01: qty 5

## 2018-08-01 MED ORDER — SODIUM CHLORIDE 0.9 % IV SOLN
Freq: Once | INTRAVENOUS | Status: AC
  Administered 2018-08-01: 10:00:00 via INTRAVENOUS
  Filled 2018-08-01: qty 250

## 2018-08-01 MED ORDER — DIPHENHYDRAMINE HCL 25 MG PO CAPS
ORAL_CAPSULE | ORAL | Status: AC
  Filled 2018-08-01: qty 2

## 2018-08-01 MED ORDER — METHYLPREDNISOLONE SODIUM SUCC 125 MG IJ SOLR
INTRAMUSCULAR | Status: AC
  Filled 2018-08-01: qty 2

## 2018-08-01 MED ORDER — ACETAMINOPHEN 325 MG PO TABS
650.0000 mg | ORAL_TABLET | Freq: Once | ORAL | Status: AC
  Administered 2018-08-01: 10:00:00 650 mg via ORAL

## 2018-08-01 MED ORDER — SODIUM CHLORIDE 0.9 % IV SOLN
16.5000 mg/kg | Freq: Once | INTRAVENOUS | Status: AC
  Administered 2018-08-01: 1400 mg via INTRAVENOUS
  Filled 2018-08-01: qty 60

## 2018-08-01 MED ORDER — METHYLPREDNISOLONE SODIUM SUCC 125 MG IJ SOLR
100.0000 mg | Freq: Once | INTRAMUSCULAR | Status: AC
  Administered 2018-08-01: 10:00:00 100 mg via INTRAVENOUS

## 2018-08-01 MED ORDER — ACETAMINOPHEN 500 MG PO TABS
ORAL_TABLET | ORAL | Status: AC
  Filled 2018-08-01: qty 2

## 2018-08-01 MED ORDER — SODIUM CHLORIDE 0.9% FLUSH
10.0000 mL | INTRAVENOUS | Status: DC | PRN
  Administered 2018-08-01: 10 mL
  Filled 2018-08-01: qty 10

## 2018-08-01 MED ORDER — DIPHENHYDRAMINE HCL 25 MG PO CAPS
50.0000 mg | ORAL_CAPSULE | Freq: Once | ORAL | Status: AC
  Administered 2018-08-01: 10:00:00 50 mg via ORAL

## 2018-08-01 NOTE — Patient Instructions (Signed)
[place] Discharge Instructions for Patients Receiving Chemotherapy  Today you received the following chemotherapy agents Darzalex.  To help prevent nausea and vomiting after your treatment, we encourage you to take your nausea medication as directed.  If you develop nausea and vomiting that is not controlled by your nausea medication, call the clinic.   BELOW ARE SYMPTOMS THAT SHOULD BE REPORTED IMMEDIATELY:  *FEVER GREATER THAN 100.5 F  *CHILLS WITH OR WITHOUT FEVER  NAUSEA AND VOMITING THAT IS NOT CONTROLLED WITH YOUR NAUSEA MEDICATION  *UNUSUAL SHORTNESS OF BREATH  *UNUSUAL BRUISING OR BLEEDING  TENDERNESS IN MOUTH AND THROAT WITH OR WITHOUT PRESENCE OF ULCERS  *URINARY PROBLEMS  *BOWEL PROBLEMS  UNUSUAL RASH Items with * indicate a potential emergency and should be followed up as soon as possible.  Feel free to call the clinic you have any questions or concerns. The clinic phone number is [phone].  Please show the [facility] at check-in to the Emergency Department and triage nurse.

## 2018-08-01 NOTE — Patient Instructions (Signed)

## 2018-08-01 NOTE — Progress Notes (Signed)
Hematology and Oncology Follow Up Visit  Emily Greer [id number] July 10, 1950 68 y.o. 08/01/2018   Principle Diagnosis:  Recurrent lambda light chain myeloma History of recurrent endometrial carcinoma Iron deficiency anemia-blood loss  PastTherapy: Status post second autologous stem cell transplant on 07/24/2014 Maintenance therapy with Pomalidomide/every 2 week Velcade - d/c'ed Xgeva 120 mg subcutaneous every 3months -next dose inNovember2019 Radiation therapy for endometrial recurrence - completed 04/20/2015 Pomalyst/Kyprolis 70mg /m2 IV q 2 weeks - s/p cycle #12 - held on 12/26/2017 for hematuria  Current Therapy:   Daratumumab/Pomalyst (1 mg) - status post cycle #4 Femara 2.5 mg po q day IV iron as indicated   Interim History:  Emily Greer is here today for follow-up.  She feels okay.  She really has no specific complaints.  She is doing well with the pomalidomide.  She has 4 more days of the 21-day cycle before she finishes.  We did do a 24-hour urine on Emily Greer.  It came back couple weeks ago.  Emily Greer lambda light chain went from over 400 mg daily down to 23 mg/day.  She has had no problems with cough or shortness of breath.  There is been no bleeding.  Has had no change in bowel or bladder habits.  She is on Femara for the uterine cancer.  She is doing okay with this.  She has had no problems with leg swelling.  Sounds like she will have a quiet July 4 holiday.     Overall, Emily Greer performance status is ECOG 1.    Medications:  Allergies as of 08/01/2018      Reactions   Codeine Nausea Only      Medication List       Accurate as of August 01, 2018  9:28 AM. If you have any questions, ask your nurse or doctor.        acyclovir 400 MG tablet Commonly known as: ZOVIRAX Take 1 tablet (400 mg total) by mouth 2 (two) times daily.   albuterol 108 (90 Base) MCG/ACT inhaler Commonly known as: VENTOLIN HFA Inhale 2 puffs into the lungs every 6 (six) hours  as needed for wheezing or shortness of breath. 2 puffs 3 times daily x 5 days then every 6 hours as needed.   amLODipine 10 MG tablet Commonly known as: NORVASC TAKE 1 TABLET BY MOUTH EVERY MORNING   aspirin EC 81 MG tablet Take 81 mg by mouth 2 (two) times daily. Stopped  By dr Myna Hidalgo until urology procedures complete   letrozole 2.5 MG tablet Commonly known as: FEMARA Take 1 tablet (2.5 mg total) by mouth daily.   loperamide 2 MG capsule Commonly known as: IMODIUM Take by mouth as needed for diarrhea or loose stools. Reported on 08/19/2015   loratadine 10 MG tablet Commonly known as: CLARITIN Take 10 mg by mouth every morning. Reported on 08/19/2015   LORazepam 0.5 MG tablet Commonly known as: Ativan Take 1 tablet (0.5 mg total) by mouth every 6 (six) hours as needed (Nausea or vomiting).   montelukast 10 MG tablet Commonly known as: SINGULAIR TAKE 1 TABLET(10 MG) BY MOUTH AT BEDTIME   Nasacort Allergy 24HR 55 MCG/ACT Aero nasal inhaler Generic drug: triamcinolone Place 2 sprays into the nose as needed.   pomalidomide 1 MG capsule Commonly known as: POMALYST Take 1 capsule (1 mg total) by mouth daily. Take with water on days 1-21. Repeat every 28 days. Authorization number [id number]   potassium chloride SA 20 MEQ tablet Commonly known as: K-DUR TAKE  1 TABLET(20 MEQ) BY MOUTH TWICE DAILY   traMADol 50 MG tablet Commonly known as: Ultram Take 1 tablet (50 mg total) by mouth every 6 (six) hours as needed.   triamterene-hydrochlorothiazide 37.5-25 MG tablet Commonly known as: MAXZIDE-25 1 tablet by mouth at bedtime   Vitamin D3 50 MCG (2000 UT) Tabs Take 2 tablets by mouth daily.       Allergies:  Allergies  Allergen Reactions  . Codeine Nausea Only    Past Medical History, Surgical history, Social history, and Family History were reviewed and updated.  Review of Systems: Review of Systems  Constitutional: Negative.   HENT: Negative.   Eyes: Negative.    Respiratory: Negative.   Cardiovascular: Negative.   Gastrointestinal: Negative.   Genitourinary: Negative.   Musculoskeletal: Negative.   Skin: Negative.   Neurological: Negative.   Endo/Heme/Allergies: Negative.   Psychiatric/Behavioral: Negative.      Physical Exam:  weight is 196 lb (88.9 kg). Emily Greer oral temperature is 98 F (36.7 C). Emily Greer blood pressure is 157/78 (abnormal) and Emily Greer pulse is 84. Emily Greer respiration is 16 and oxygen saturation is 99%.   Wt Readings from Last 3 Encounters:  08/01/18 196 lb (88.9 kg)  08/01/18 196 lb 12 oz (89.2 kg)  07/04/18 192 lb (87.1 kg)    Physical Exam Vitals signs reviewed.  HENT:     Head: Normocephalic and atraumatic.  Eyes:     Pupils: Pupils are equal, round, and reactive to light.  Neck:     Musculoskeletal: Normal range of motion.  Cardiovascular:     Rate and Rhythm: Normal rate and regular rhythm.     Heart sounds: Normal heart sounds.  Pulmonary:     Effort: Pulmonary effort is normal.     Breath sounds: Normal breath sounds.  Abdominal:     General: Bowel sounds are normal.     Palpations: Abdomen is soft.  Musculoskeletal: Normal range of motion.        General: No tenderness or deformity.  Lymphadenopathy:     Cervical: No cervical adenopathy.  Skin:    General: Skin is warm and dry.     Findings: No erythema or rash.  Neurological:     Mental Status: She is alert and oriented to person, place, and time.  Psychiatric:        Behavior: Behavior normal.        Thought Content: Thought content normal.        Judgment: Judgment normal.      Lab Results  Component Value Date   WBC 3.7 (L) 07/18/2018   HGB 10.5 (L) 07/18/2018   HCT 33.0 (L) 07/18/2018   MCV 84.2 07/18/2018   PLT 244 07/18/2018   Lab Results  Component Value Date   FERRITIN 630 (H) 04/11/2018   IRON 63 04/11/2018   TIBC 359 04/11/2018   UIBC 295 04/11/2018   IRONPCTSAT 18 (L) 04/11/2018   Lab Results  Component Value Date   RETICCTPCT  2.9 (H) 10/17/2017   RBC 3.92 07/18/2018   Lab Results  Component Value Date   KPAFRELGTCHN 4.7 07/04/2018   LAMBDASER 56.7 (H) 07/04/2018   KAPLAMBRATIO 0.09 (L) 07/10/2018   Lab Results  Component Value Date   IGGSERUM 409 (L) 07/04/2018   IGGSERUM 487 (L) 07/04/2018   IGA 17 (L) 07/04/2018   IGA 19 (L) 07/04/2018   IGMSERUM <5 (L) 07/04/2018   IGMSERUM <5 (L) 07/04/2018   Lab Results  Component Value Date  TOTALPROTELP 6.6 07/04/2018   ALBUMINELP 3.8 07/04/2018   A1GS 0.3 07/04/2018   A2GS 1.1 (H) 07/04/2018   BETS 1.1 07/04/2018   BETA2SER 0.4 11/23/2014   GAMS 0.3 (L) 07/04/2018   MSPIKE 0.1 (H) 07/04/2018   SPEI Comment 06/06/2018     Chemistry      Component Value Date/Time   NA 141 07/18/2018 0943   NA 141 01/10/2017 1115   NA 140 06/21/2016 0918   K 3.7 07/18/2018 0943   K 4.0 01/10/2017 1115   K 4.3 06/21/2016 0918   CL 107 07/18/2018 0943   CL 106 01/10/2017 1115   CO2 25 07/18/2018 0943   CO2 27 01/10/2017 1115   CO2 20 (L) 06/21/2016 0918   BUN 14 07/18/2018 0943   BUN 15 01/10/2017 1115   BUN 15.8 06/21/2016 0918   CREATININE 0.94 07/18/2018 0943   CREATININE 1.0 01/10/2017 1115   CREATININE 0.8 06/21/2016 0918      Component Value Date/Time   CALCIUM 9.1 07/18/2018 0943   CALCIUM 9.5 01/10/2017 1115   CALCIUM 9.4 06/21/2016 0918   ALKPHOS 34 (L) 07/18/2018 0943   ALKPHOS 40 01/10/2017 1115   ALKPHOS 66 06/21/2016 0918   AST 11 (L) 07/18/2018 0943   AST 17 06/21/2016 0918   ALT 14 07/18/2018 0943   ALT 19 01/10/2017 1115   ALT 37 06/21/2016 0918   BILITOT 0.4 07/18/2018 0943   BILITOT 0.32 06/21/2016 0918       Impression and Plan: Emily Greer is a very pleasant 68 yo African American female with recurrent lambda light chain myeloma.   Emily Greer second a stem cell transplant for light chain myeloma was in June 2016.   She also has localized recurrent endometrial cancer (followed by gyn onc Dr. Nelly Rout).  She is doing well with this.   I really think that she continues to do well.  We will continue Emily Greer on the daratumumab with pomalidomide.  She gets Emily Greer treatment every 2 weeks.  I will see Emily Greer back in a month.   Josph Macho, MD 7/2/20209:28 AM

## 2018-08-02 LAB — KAPPA/LAMBDA LIGHT CHAINS
Kappa free light chain: 3.3 mg/L (ref 3.3–19.4)
Kappa, lambda light chain ratio: 0.06 — ABNORMAL LOW (ref 0.26–1.65)
Lambda free light chains: 56.4 mg/L — ABNORMAL HIGH (ref 5.7–26.3)

## 2018-08-02 LAB — IGG, IGA, IGM
IgA: 16 mg/dL — ABNORMAL LOW (ref 87–352)
IgG (Immunoglobin G), Serum: 370 mg/dL — ABNORMAL LOW (ref 586–1602)
IgM (Immunoglobulin M), Srm: 6 mg/dL — ABNORMAL LOW (ref 26–217)

## 2018-08-07 LAB — PROTEIN ELECTROPHORESIS, SERUM, WITH REFLEX
A/G Ratio: 1.8 — ABNORMAL HIGH (ref 0.7–1.7)
Albumin ELP: 3.7 g/dL (ref 2.9–4.4)
Alpha-1-Globulin: 0.1 g/dL (ref 0.0–0.4)
Alpha-2-Globulin: 0.8 g/dL (ref 0.4–1.0)
Beta Globulin: 0.9 g/dL (ref 0.7–1.3)
Gamma Globulin: 0.3 g/dL — ABNORMAL LOW (ref 0.4–1.8)
Globulin, Total: 2.1 g/dL — ABNORMAL LOW (ref 2.2–3.9)
M-Spike, %: 0.1 g/dL — ABNORMAL HIGH
SPEP Interpretation: 0
Total Protein ELP: 5.8 g/dL — ABNORMAL LOW (ref 6.0–8.5)

## 2018-08-07 LAB — IMMUNOFIXATION REFLEX, SERUM
IgA: 15 mg/dL — ABNORMAL LOW (ref 87–352)
IgG (Immunoglobin G), Serum: 382 mg/dL — ABNORMAL LOW (ref 586–1602)
IgM (Immunoglobulin M), Srm: <5 mg/dL — ABNORMAL LOW (ref 26–217)

## 2018-08-15 ENCOUNTER — Inpatient Hospital Stay: Payer: Medicare Other

## 2018-08-15 ENCOUNTER — Other Ambulatory Visit: Payer: Self-pay

## 2018-08-15 DIAGNOSIS — C9001 Multiple myeloma in remission: Secondary | ICD-10-CM

## 2018-08-15 DIAGNOSIS — Z95828 Presence of other vascular implants and grafts: Secondary | ICD-10-CM

## 2018-08-15 DIAGNOSIS — Z5112 Encounter for antineoplastic immunotherapy: Secondary | ICD-10-CM | POA: Diagnosis not present

## 2018-08-15 DIAGNOSIS — C9000 Multiple myeloma not having achieved remission: Secondary | ICD-10-CM

## 2018-08-15 DIAGNOSIS — Z79811 Long term (current) use of aromatase inhibitors: Secondary | ICD-10-CM | POA: Diagnosis not present

## 2018-08-15 DIAGNOSIS — C541 Malignant neoplasm of endometrium: Secondary | ICD-10-CM | POA: Diagnosis not present

## 2018-08-15 DIAGNOSIS — D500 Iron deficiency anemia secondary to blood loss (chronic): Secondary | ICD-10-CM | POA: Diagnosis not present

## 2018-08-15 DIAGNOSIS — I10 Essential (primary) hypertension: Secondary | ICD-10-CM | POA: Diagnosis not present

## 2018-08-15 LAB — CMP (CANCER CENTER ONLY)
ALT: 16 U/L (ref 0–44)
AST: 12 U/L — ABNORMAL LOW (ref 15–41)
Albumin: 4.5 g/dL (ref 3.5–5.0)
Alkaline Phosphatase: 35 U/L — ABNORMAL LOW (ref 38–126)
Anion gap: 8 (ref 5–15)
BUN: 18 mg/dL (ref 8–23)
CO2: 25 mmol/L (ref 22–32)
Calcium: 8.3 mg/dL — ABNORMAL LOW (ref 8.9–10.3)
Chloride: 110 mmol/L (ref 98–111)
Creatinine: 0.94 mg/dL (ref 0.44–1.00)
GFR, Est AFR Am: >60 mL/min (ref >60)
GFR, Estimated: >60 mL/min (ref >60)
Glucose, Bld: 89 mg/dL (ref 70–99)
Potassium: 4 mmol/L (ref 3.5–5.1)
Sodium: 143 mmol/L (ref 135–145)
Total Bilirubin: 0.3 mg/dL (ref 0.3–1.2)
Total Protein: 6.2 g/dL — ABNORMAL LOW (ref 6.5–8.1)

## 2018-08-15 LAB — CBC WITH DIFFERENTIAL (CANCER CENTER ONLY)
Abs Immature Granulocytes: 0.02 10*3/uL (ref 0.00–0.07)
Basophils Absolute: 0.1 10*3/uL (ref 0.0–0.1)
Basophils Relative: 3 %
Eosinophils Absolute: 0.1 10*3/uL (ref 0.0–0.5)
Eosinophils Relative: 3 %
HCT: 35.1 % — ABNORMAL LOW (ref 36.0–46.0)
Hemoglobin: 11 g/dL — ABNORMAL LOW (ref 12.0–15.0)
Immature Granulocytes: 0 %
Lymphocytes Relative: 21 %
Lymphs Abs: 1 10*3/uL (ref 0.7–4.0)
MCH: 26.4 pg (ref 26.0–34.0)
MCHC: 31.3 g/dL (ref 30.0–36.0)
MCV: 84.4 fL (ref 80.0–100.0)
Monocytes Absolute: 0.6 10*3/uL (ref 0.1–1.0)
Monocytes Relative: 13 %
Neutro Abs: 2.8 10*3/uL (ref 1.7–7.7)
Neutrophils Relative %: 60 %
Platelet Count: 230 10*3/uL (ref 150–400)
RBC: 4.16 MIL/uL (ref 3.87–5.11)
RDW: 16.2 % — ABNORMAL HIGH (ref 11.5–15.5)
WBC Count: 4.6 10*3/uL (ref 4.0–10.5)
nRBC: 0 % (ref 0.0–0.2)

## 2018-08-15 MED ORDER — DIPHENHYDRAMINE HCL 25 MG PO CAPS
50.0000 mg | ORAL_CAPSULE | Freq: Once | ORAL | Status: AC
  Administered 2018-08-15: 50 mg via ORAL

## 2018-08-15 MED ORDER — HEPARIN SOD (PORK) LOCK FLUSH 100 UNIT/ML IV SOLN
500.0000 [IU] | Freq: Once | INTRAVENOUS | Status: AC | PRN
  Administered 2018-08-15: 500 [IU]
  Filled 2018-08-15: qty 5

## 2018-08-15 MED ORDER — SODIUM CHLORIDE 0.9 % IV SOLN
16.6000 mg/kg | Freq: Once | INTRAVENOUS | Status: AC
  Administered 2018-08-15: 1400 mg via INTRAVENOUS
  Filled 2018-08-15: qty 60

## 2018-08-15 MED ORDER — ACETAMINOPHEN 325 MG PO TABS
ORAL_TABLET | ORAL | Status: AC
  Filled 2018-08-15: qty 2

## 2018-08-15 MED ORDER — SODIUM CHLORIDE 0.9% FLUSH
10.0000 mL | INTRAVENOUS | Status: DC | PRN
  Administered 2018-08-15: 13:00:00 10 mL
  Filled 2018-08-15: qty 10

## 2018-08-15 MED ORDER — METHYLPREDNISOLONE SODIUM SUCC 125 MG IJ SOLR
100.0000 mg | Freq: Once | INTRAMUSCULAR | Status: AC
  Administered 2018-08-15: 100 mg via INTRAVENOUS

## 2018-08-15 MED ORDER — DIPHENHYDRAMINE HCL 25 MG PO CAPS
ORAL_CAPSULE | ORAL | Status: AC
  Filled 2018-08-15: qty 2

## 2018-08-15 MED ORDER — ACETAMINOPHEN 325 MG PO TABS
650.0000 mg | ORAL_TABLET | Freq: Once | ORAL | Status: AC
  Administered 2018-08-15: 650 mg via ORAL

## 2018-08-15 MED ORDER — SODIUM CHLORIDE 0.9 % IV SOLN
Freq: Once | INTRAVENOUS | Status: AC
  Administered 2018-08-15: 09:00:00 via INTRAVENOUS
  Filled 2018-08-15: qty 250

## 2018-08-15 NOTE — Patient Instructions (Addendum)
Daratumumab injection What is this medicine? DARATUMUMAB (dar a toom ue mab) is a monoclonal antibody. It is used to treat multiple myeloma. This medicine may be used for other purposes; ask your health care provider or pharmacist if you have questions. COMMON BRAND NAME(S): DARZALEX What should I tell my health care provider before I take this medicine? They need to know if you have any of these conditions:  infection (especially a virus infection such as chickenpox, herpes, or hepatitis B virus)  lung or breathing disease  an unusual or allergic reaction to daratumumab, other medicines, foods, dyes, or preservatives  pregnant or trying to get pregnant  breast-feeding How should I use this medicine? This medicine is for infusion into a vein. It is given by a health care professional in a hospital or clinic setting. Talk to your pediatrician regarding the use of this medicine in children. Special care may be needed. Overdosage: If you think you have taken too much of this medicine contact a poison control center or emergency room at once. NOTE: This medicine is only for you. Do not share this medicine with others. What if I miss a dose? Keep appointments for follow-up doses as directed. It is important not to miss your dose. Call your doctor or health care professional if you are unable to keep an appointment. What may interact with this medicine? Interactions have not been studied. This list may not describe all possible interactions. Give your health care provider a list of all the medicines, herbs, non-prescription drugs, or dietary supplements you use. Also tell them if you smoke, drink alcohol, or use illegal drugs. Some items may interact with your medicine. What should I watch for while using this medicine? This drug may make you feel generally unwell. Report any side effects. Continue your course of treatment even though you feel ill unless your doctor tells you to stop. This  medicine can cause serious allergic reactions. To reduce your risk you may need to take medicine before treatment with this medicine. Take your medicine as directed. This medicine can affect the results of blood tests to match your blood type. These changes can last for up to 6 months after the final dose. Your healthcare provider will do blood tests to match your blood type before you start treatment. Tell all of your healthcare providers that you are being treated with this medicine before receiving a blood transfusion. This medicine can affect the results of some tests used to determine treatment response; extra tests may be needed to evaluate response. Do not become pregnant while taking this medicine or for 3 months after stopping it. Women should inform their doctor if they wish to become pregnant or think they might be pregnant. There is a potential for serious side effects to an unborn child. Talk to your health care professional or pharmacist for more information. What side effects may I notice from receiving this medicine? Side effects that you should report to your doctor or health care professional as soon as possible:  allergic reactions like skin rash, itching or hives, swelling of the face, lips, or tongue  breathing problems  chills  cough  dizziness  feeling faint or lightheaded  headache  low blood counts - this medicine may decrease the number of white blood cells, red blood cells and platelets. You may be at increased risk for infections and bleeding.  nausea, vomiting  shortness of breath  signs of decreased platelets or bleeding - bruising, pinpoint red spots on  the skin, black, tarry stools, blood in the urine  signs of decreased red blood cells - unusually weak or tired, feeling faint or lightheaded, falls  signs of infection - fever or chills, cough, sore throat, pain or difficulty passing urine  signs and symptoms of liver injury like dark yellow or brown  urine; general ill feeling or flu-like symptoms; light-colored stools; loss of appetite; right upper belly pain; unusually weak or tired; yellowing of the eyes or skin Side effects that usually do not require medical attention (report to your doctor or health care professional if they continue or are bothersome):  back pain  constipation  loss of appetite  diarrhea  joint pain  muscle cramps  pain, tingling, numbness in the hands or feet  swelling of the ankles, feet, hands  tiredness  trouble sleeping This list may not describe all possible side effects. Call your doctor for medical advice about side effects. You may report side effects to FDA at 1-800-FDA-1088. Where should I keep my medicine? Keep out of the reach of children. This drug is given in a hospital or clinic and will not be stored at home. NOTE: This sheet is a summary. It may not cover all possible information. If you have questions about this medicine, talk to your doctor, pharmacist, or health care provider.  2020 [employer]vier/Gold Standard (2017-11-01 14:00:48) [facility] Discharge Instructions for Patients Receiving Chemotherapy  Today you received the following chemotherapy agents Darzalex.  To help prevent nausea and vomiting after your treatment, we encourage you to take your nausea medication as directed.  If you develop nausea and vomiting that is not controlled by your nausea medication, call the clinic.   BELOW ARE SYMPTOMS THAT SHOULD BE REPORTED IMMEDIATELY:  *FEVER GREATER THAN 100.5 F  *CHILLS WITH OR WITHOUT FEVER  NAUSEA AND VOMITING THAT IS NOT CONTROLLED WITH YOUR NAUSEA MEDICATION  *UNUSUAL SHORTNESS OF BREATH  *UNUSUAL BRUISING OR BLEEDING  TENDERNESS IN MOUTH AND THROAT WITH OR WITHOUT PRESENCE OF ULCERS  *URINARY PROBLEMS  *BOWEL PROBLEMS  UNUSUAL RASH Items with * indicate a potential emergency and should be followed up as soon as possible.  Feel free to call  the clinic you have any questions or concerns. The clinic phone number is [phone].  Please show the [facility] at check-in to the Emergency Department and triage nurse.

## 2018-08-15 NOTE — Patient Instructions (Signed)

## 2018-08-15 NOTE — Progress Notes (Signed)
9:48 AM Encouraged patient to take Calcium 1000-1500mg  daily. Verbalized understanding.

## 2018-08-16 ENCOUNTER — Other Ambulatory Visit: Payer: Self-pay | Admitting: Hematology & Oncology

## 2018-08-16 DIAGNOSIS — C9001 Multiple myeloma in remission: Secondary | ICD-10-CM

## 2018-08-16 DIAGNOSIS — E876 Hypokalemia: Secondary | ICD-10-CM

## 2018-08-16 DIAGNOSIS — C9000 Multiple myeloma not having achieved remission: Secondary | ICD-10-CM

## 2018-08-16 DIAGNOSIS — I10 Essential (primary) hypertension: Secondary | ICD-10-CM

## 2018-08-19 ENCOUNTER — Other Ambulatory Visit: Payer: Self-pay | Admitting: Hematology & Oncology

## 2018-08-20 ENCOUNTER — Other Ambulatory Visit: Payer: Self-pay | Admitting: *Deleted

## 2018-08-20 MED ORDER — POMALIDOMIDE 1 MG PO CAPS: ORAL_CAPSULE | ORAL | 0 refills | Status: DC

## 2018-08-27 ENCOUNTER — Other Ambulatory Visit: Payer: Self-pay | Admitting: Hematology & Oncology

## 2018-08-29 ENCOUNTER — Inpatient Hospital Stay (HOSPITAL_BASED_OUTPATIENT_CLINIC_OR_DEPARTMENT_OTHER): Payer: Medicare Other | Admitting: Hematology & Oncology

## 2018-08-29 ENCOUNTER — Inpatient Hospital Stay: Payer: Medicare Other

## 2018-08-29 ENCOUNTER — Telehealth: Payer: Self-pay | Admitting: Hematology & Oncology

## 2018-08-29 ENCOUNTER — Other Ambulatory Visit: Payer: Self-pay

## 2018-08-29 ENCOUNTER — Encounter: Payer: Self-pay | Admitting: Hematology & Oncology

## 2018-08-29 VITALS — BP 133/70 | HR 70 | Resp 18

## 2018-08-29 DIAGNOSIS — D508 Other iron deficiency anemias: Secondary | ICD-10-CM

## 2018-08-29 DIAGNOSIS — C9000 Multiple myeloma not having achieved remission: Secondary | ICD-10-CM

## 2018-08-29 DIAGNOSIS — C541 Malignant neoplasm of endometrium: Secondary | ICD-10-CM | POA: Diagnosis not present

## 2018-08-29 DIAGNOSIS — I10 Essential (primary) hypertension: Secondary | ICD-10-CM

## 2018-08-29 DIAGNOSIS — Z79811 Long term (current) use of aromatase inhibitors: Secondary | ICD-10-CM | POA: Diagnosis not present

## 2018-08-29 DIAGNOSIS — D500 Iron deficiency anemia secondary to blood loss (chronic): Secondary | ICD-10-CM | POA: Diagnosis not present

## 2018-08-29 DIAGNOSIS — C9001 Multiple myeloma in remission: Secondary | ICD-10-CM

## 2018-08-29 DIAGNOSIS — Z7982 Long term (current) use of aspirin: Secondary | ICD-10-CM

## 2018-08-29 DIAGNOSIS — Z79899 Other long term (current) drug therapy: Secondary | ICD-10-CM

## 2018-08-29 DIAGNOSIS — C7982 Secondary malignant neoplasm of genital organs: Secondary | ICD-10-CM

## 2018-08-29 DIAGNOSIS — Z5112 Encounter for antineoplastic immunotherapy: Secondary | ICD-10-CM | POA: Diagnosis not present

## 2018-08-29 LAB — LACTATE DEHYDROGENASE: LDH: 175 U/L (ref 98–192)

## 2018-08-29 LAB — CBC WITH DIFFERENTIAL (CANCER CENTER ONLY)
Abs Immature Granulocytes: 0.01 10*3/uL (ref 0.00–0.07)
Basophils Absolute: 0.1 10*3/uL (ref 0.0–0.1)
Basophils Relative: 3 %
Eosinophils Absolute: 0.2 10*3/uL (ref 0.0–0.5)
Eosinophils Relative: 5 %
HCT: 34.2 % — ABNORMAL LOW (ref 36.0–46.0)
Hemoglobin: 11.1 g/dL — ABNORMAL LOW (ref 12.0–15.0)
Immature Granulocytes: 0 %
Lymphocytes Relative: 14 %
Lymphs Abs: 0.6 10*3/uL — ABNORMAL LOW (ref 0.7–4.0)
MCH: 26.9 pg (ref 26.0–34.0)
MCHC: 32.5 g/dL (ref 30.0–36.0)
MCV: 82.8 fL (ref 80.0–100.0)
Monocytes Absolute: 0.6 10*3/uL (ref 0.1–1.0)
Monocytes Relative: 15 %
Neutro Abs: 2.8 10*3/uL (ref 1.7–7.7)
Neutrophils Relative %: 63 %
Platelet Count: 229 10*3/uL (ref 150–400)
RBC: 4.13 MIL/uL (ref 3.87–5.11)
RDW: 16.1 % — ABNORMAL HIGH (ref 11.5–15.5)
WBC Count: 4.4 10*3/uL (ref 4.0–10.5)
nRBC: 0 % (ref 0.0–0.2)

## 2018-08-29 LAB — CMP (CANCER CENTER ONLY)
ALT: 15 U/L (ref 0–44)
AST: 13 U/L — ABNORMAL LOW (ref 15–41)
Albumin: 4.1 g/dL (ref 3.5–5.0)
Alkaline Phosphatase: 34 U/L — ABNORMAL LOW (ref 38–126)
Anion gap: 10 (ref 5–15)
BUN: 15 mg/dL (ref 8–23)
CO2: 25 mmol/L (ref 22–32)
Calcium: 9.1 mg/dL (ref 8.9–10.3)
Chloride: 106 mmol/L (ref 98–111)
Creatinine: 0.83 mg/dL (ref 0.44–1.00)
GFR, Est AFR Am: >60 mL/min (ref >60)
GFR, Estimated: >60 mL/min (ref >60)
Glucose, Bld: 94 mg/dL (ref 70–99)
Potassium: 3.6 mmol/L (ref 3.5–5.1)
Sodium: 141 mmol/L (ref 135–145)
Total Bilirubin: 0.3 mg/dL (ref 0.3–1.2)
Total Protein: 5.9 g/dL — ABNORMAL LOW (ref 6.5–8.1)

## 2018-08-29 MED ORDER — DIPHENHYDRAMINE HCL 25 MG PO CAPS
50.0000 mg | ORAL_CAPSULE | Freq: Once | ORAL | Status: AC
  Administered 2018-08-29: 50 mg via ORAL

## 2018-08-29 MED ORDER — SODIUM CHLORIDE 0.9 % IV SOLN
16.6000 mg/kg | Freq: Once | INTRAVENOUS | Status: AC
  Administered 2018-08-29: 1400 mg via INTRAVENOUS
  Filled 2018-08-29: qty 60

## 2018-08-29 MED ORDER — SODIUM CHLORIDE 0.9 % IV SOLN
510.0000 mg | Freq: Once | INTRAVENOUS | Status: AC
  Administered 2018-08-29: 510 mg via INTRAVENOUS
  Filled 2018-08-29: qty 510

## 2018-08-29 MED ORDER — HEPARIN SOD (PORK) LOCK FLUSH 100 UNIT/ML IV SOLN
500.0000 [IU] | Freq: Once | INTRAVENOUS | Status: AC | PRN
  Administered 2018-08-29: 500 [IU]
  Filled 2018-08-29: qty 5

## 2018-08-29 MED ORDER — METHYLPREDNISOLONE SODIUM SUCC 125 MG IJ SOLR
100.0000 mg | Freq: Once | INTRAMUSCULAR | Status: AC
  Administered 2018-08-29: 100 mg via INTRAVENOUS

## 2018-08-29 MED ORDER — SODIUM CHLORIDE 0.9% FLUSH
10.0000 mL | INTRAVENOUS | Status: DC | PRN
  Administered 2018-08-29: 10 mL
  Filled 2018-08-29: qty 10

## 2018-08-29 MED ORDER — SODIUM CHLORIDE 0.9 % IV SOLN
Freq: Once | INTRAVENOUS | Status: AC
  Administered 2018-08-29: 10:00:00 via INTRAVENOUS
  Filled 2018-08-29: qty 250

## 2018-08-29 MED ORDER — ACETAMINOPHEN 325 MG PO TABS
ORAL_TABLET | ORAL | Status: AC
  Filled 2018-08-29: qty 2

## 2018-08-29 MED ORDER — DENOSUMAB 120 MG/1.7ML ~~LOC~~ SOLN
SUBCUTANEOUS | Status: AC
  Filled 2018-08-29: qty 1.7

## 2018-08-29 MED ORDER — METHYLPREDNISOLONE SODIUM SUCC 125 MG IJ SOLR
INTRAMUSCULAR | Status: AC
  Filled 2018-08-29: qty 2

## 2018-08-29 MED ORDER — DIPHENHYDRAMINE HCL 25 MG PO CAPS
ORAL_CAPSULE | ORAL | Status: AC
  Filled 2018-08-29: qty 2

## 2018-08-29 MED ORDER — DENOSUMAB 120 MG/1.7ML ~~LOC~~ SOLN
120.0000 mg | Freq: Once | SUBCUTANEOUS | Status: AC
  Administered 2018-08-29: 120 mg via SUBCUTANEOUS

## 2018-08-29 MED ORDER — ACETAMINOPHEN 325 MG PO TABS
650.0000 mg | ORAL_TABLET | Freq: Once | ORAL | Status: AC
  Administered 2018-08-29: 650 mg via ORAL

## 2018-08-29 MED ORDER — HEPARIN SOD (PORK) LOCK FLUSH 100 UNIT/ML IV SOLN
250.0000 [IU] | Freq: Once | INTRAVENOUS | Status: DC | PRN
  Filled 2018-08-29: qty 5

## 2018-08-29 NOTE — Progress Notes (Addendum)
Hematology and Oncology Follow Up Visit  Emily Greer [id number] 1951/01/26 68 y.o. 08/29/2018   Principle Diagnosis:  Recurrent lambda light chain myeloma History of recurrent endometrial carcinoma Iron deficiency anemia-blood loss  PastTherapy: Status post second autologous stem cell transplant on 07/24/2014 Maintenance therapy with Pomalidomide/every 2 week Velcade - d/c'ed Xgeva 120 mg subcutaneous every 3months -next dose in October 2020  Radiation therapy for endometrial recurrence - completed 04/20/2015 Pomalyst/Kyprolis 70mg /m2 IV q 2 weeks - s/p cycle #12 - held on 12/26/2017 for hematuria  Current Therapy:   Daratumumab/Pomalyst (1 mg) - status post cycle #6 Femara 2.5 mg po q day IV iron as indicated   Interim History:  Emily Greer is here today for follow-up.  She feels okay.  She really has no specific complaints.  She is doing well with the pomalidomide.    Her husband is having some medical issues.  Not sure exactly what is going on with him.  It sounds like he has some kind of bullous disease and has some large blisters around the left ankle.  Emily Greer light chain disease has been holding pretty steady.  Her last lambda light chain level that we checked back in early July was 5.6 mg/dL.  This is holding steady.  She is had no hematuria.  She has had no issues with respect to the uterine cancer.  She is on Femara for this..  She does have low iron levels.  Back in early July, her ferritin was 438 with an iron saturation of only 16%.  We will go ahead and try to give her a dose of IV iron if possible.  She is still taking care of her mother.  Her mom is quite elderly.  They are going back down to [place], [place] tomorrow to take her mom home for a little bit.       Overall, her performance status is ECOG 1.    Medications:  Allergies as of 08/29/2018      Reactions   Codeine Nausea Only      Medication List       Accurate as of August 29, 2018  9:38 AM. If you have any questions, ask your nurse or doctor.        acyclovir 400 MG tablet Commonly known as: ZOVIRAX Take 1 tablet (400 mg total) by mouth 2 (two) times daily.   albuterol 108 (90 Base) MCG/ACT inhaler Commonly known as: VENTOLIN HFA Inhale 2 puffs into the lungs every 6 (six) hours as needed for wheezing or shortness of breath. 2 puffs 3 times daily x 5 days then every 6 hours as needed.   amLODipine 10 MG tablet Commonly known as: NORVASC TAKE 1 TABLET BY MOUTH EVERY MORNING   aspirin EC 81 MG tablet Take 81 mg by mouth 2 (two) times daily. Stopped  By dr Myna Hidalgo until urology procedures complete   CALCIUM 1200+D3 PO Take 1 tablet by mouth daily.   letrozole 2.5 MG tablet Commonly known as: FEMARA Take 1 tablet (2.5 mg total) by mouth daily.   loperamide 2 MG capsule Commonly known as: IMODIUM Take by mouth as needed for diarrhea or loose stools. Reported on 08/19/2015   loratadine 10 MG tablet Commonly known as: CLARITIN Take 10 mg by mouth every morning. Reported on 08/19/2015   LORazepam 0.5 MG tablet Commonly known as: Ativan Take 1 tablet (0.5 mg total) by mouth every 6 (six) hours as needed (Nausea or vomiting).   montelukast 10  MG tablet Commonly known as: SINGULAIR TAKE 1 TABLET(10 MG) BY MOUTH AT BEDTIME   Nasacort Allergy 24HR 55 MCG/ACT Aero nasal inhaler Generic drug: triamcinolone Place 2 sprays into the nose as needed.   pomalidomide 1 MG capsule Commonly known as: Pomalyst TAKE 1 CAPSULE BY MOUTH ONCE DAILY FOR 21 DAYS ON AND 7 DAYS OFF-AUTH #[id number]   potassium chloride SA 20 MEQ tablet Commonly known as: K-DUR TAKE 1 TABLET(20 MEQ) BY MOUTH TWICE DAILY   traMADol 50 MG tablet Commonly known as: Ultram Take 1 tablet (50 mg total) by mouth every 6 (six) hours as needed.   triamterene-hydrochlorothiazide 37.5-25 MG tablet Commonly known as: MAXZIDE-25 1 tablet by mouth at bedtime   Vitamin D3  50 MCG (2000 UT) Tabs Take 2 tablets by mouth daily.       Allergies:  Allergies  Allergen Reactions  . Codeine Nausea Only    Past Medical History, Surgical history, Social history, and Family History were reviewed and updated.  Review of Systems: Review of Systems  Constitutional: Negative.   HENT: Negative.   Eyes: Negative.   Respiratory: Negative.   Cardiovascular: Negative.   Gastrointestinal: Negative.   Genitourinary: Negative.   Musculoskeletal: Negative.   Skin: Negative.   Neurological: Negative.   Endo/Heme/Allergies: Negative.   Psychiatric/Behavioral: Negative.      Physical Exam:  vitals were not taken for this visit.   Wt Readings from Last 3 Encounters:  08/29/18 198 lb 4 oz (89.9 kg)  08/01/18 196 lb (88.9 kg)  08/01/18 196 lb 12 oz (89.2 kg)    Physical Exam Vitals signs reviewed.  HENT:     Head: Normocephalic and atraumatic.  Eyes:     Pupils: Pupils are equal, round, and reactive to light.  Neck:     Musculoskeletal: Normal range of motion.  Cardiovascular:     Rate and Rhythm: Normal rate and regular rhythm.     Heart sounds: Normal heart sounds.  Pulmonary:     Effort: Pulmonary effort is normal.     Breath sounds: Normal breath sounds.  Abdominal:     General: Bowel sounds are normal.     Palpations: Abdomen is soft.  Musculoskeletal: Normal range of motion.        General: No tenderness or deformity.  Lymphadenopathy:     Cervical: No cervical adenopathy.  Skin:    General: Skin is warm and dry.     Findings: No erythema or rash.  Neurological:     Mental Status: She is alert and oriented to person, place, and time.  Psychiatric:        Behavior: Behavior normal.        Thought Content: Thought content normal.        Judgment: Judgment normal.      Lab Results  Component Value Date   WBC 4.4 08/29/2018   HGB 11.1 (L) 08/29/2018   HCT 34.2 (L) 08/29/2018   MCV 82.8 08/29/2018   PLT 229 08/29/2018   Lab Results   Component Value Date   FERRITIN 438 (H) 08/01/2018   IRON 60 08/01/2018   TIBC 363 08/01/2018   UIBC 303 08/01/2018   IRONPCTSAT 16 (L) 08/01/2018   Lab Results  Component Value Date   RETICCTPCT 2.9 (H) 10/17/2017   RBC 4.13 08/29/2018   Lab Results  Component Value Date   KPAFRELGTCHN 3.3 08/01/2018   LAMBDASER 56.4 (H) 08/01/2018   KAPLAMBRATIO 0.06 (L) 08/01/2018   Lab Results  Component  Value Date   IGGSERUM 370 (L) 08/01/2018   IGGSERUM 382 (L) 08/01/2018   IGA 16 (L) 08/01/2018   IGA 15 (L) 08/01/2018   IGMSERUM 6 (L) 08/01/2018   IGMSERUM <5 (L) 08/01/2018   Lab Results  Component Value Date   TOTALPROTELP 5.8 (L) 08/01/2018   ALBUMINELP 3.7 08/01/2018   A1GS 0.1 08/01/2018   A2GS 0.8 08/01/2018   BETS 0.9 08/01/2018   BETA2SER 0.4 11/23/2014   GAMS 0.3 (L) 08/01/2018   MSPIKE 0.1 (H) 08/01/2018   SPEI Comment 06/06/2018     Chemistry      Component Value Date/Time   NA 141 08/29/2018 0902   NA 141 01/10/2017 1115   NA 140 06/21/2016 0918   K 3.6 08/29/2018 0902   K 4.0 01/10/2017 1115   K 4.3 06/21/2016 0918   CL 106 08/29/2018 0902   CL 106 01/10/2017 1115   CO2 25 08/29/2018 0902   CO2 27 01/10/2017 1115   CO2 20 (L) 06/21/2016 0918   BUN 15 08/29/2018 0902   BUN 15 01/10/2017 1115   BUN 15.8 06/21/2016 0918   CREATININE 0.83 08/29/2018 0902   CREATININE 1.0 01/10/2017 1115   CREATININE 0.8 06/21/2016 0918      Component Value Date/Time   CALCIUM 9.1 08/29/2018 0902   CALCIUM 9.5 01/10/2017 1115   CALCIUM 9.4 06/21/2016 0918   ALKPHOS 34 (L) 08/29/2018 0902   ALKPHOS 40 01/10/2017 1115   ALKPHOS 66 06/21/2016 0918   AST 13 (L) 08/29/2018 0902   AST 17 06/21/2016 0918   ALT 15 08/29/2018 0902   ALT 19 01/10/2017 1115   ALT 37 06/21/2016 0918   BILITOT 0.3 08/29/2018 0902   BILITOT 0.32 06/21/2016 0918       Impression and Plan: Emily Greer is a very pleasant 68 yo African American female with recurrent lambda light chain  myeloma.   Her second a stem cell transplant for light chain myeloma was in June 2016.   She also has localized recurrent endometrial cancer (followed by gyn onc Dr. Nelly Rout).  She is doing well with this.  I really think that she continues to do well.  We will continue her on the daratumumab with pomalidomide.  She now gets her therapy once a month.  I know this will be a lot easier for her.  I will see her back in a month.   Josph Macho, MD 7/30/20209:38 AM

## 2018-08-29 NOTE — Telephone Encounter (Signed)
Appointments scheduled calendar printed per 7/30  los

## 2018-08-29 NOTE — Patient Instructions (Signed)
Denosumab injection What is this medicine? DENOSUMAB (den oh sue mab) slows bone breakdown. Prolia is used to treat osteoporosis in women after menopause and in men, and in people who are taking corticosteroids for 6 months or more. Xgeva is used to treat a high calcium level due to cancer and to prevent bone fractures and other bone problems caused by multiple myeloma or cancer bone metastases. Xgeva is also used to treat giant cell tumor of the bone. This medicine may be used for other purposes; ask your health care provider or pharmacist if you have questions. COMMON BRAND NAME(S): Prolia, XGEVA What should I tell my health care provider before I take this medicine? They need to know if you have any of these conditions:  dental disease  having surgery or tooth extraction  infection  kidney disease  low levels of calcium or Vitamin D in the blood  malnutrition  on hemodialysis  skin conditions or sensitivity  thyroid or parathyroid disease  an unusual reaction to denosumab, other medicines, foods, dyes, or preservatives  pregnant or trying to get pregnant  breast-feeding How should I use this medicine? This medicine is for injection under the skin. It is given by a health care professional in a hospital or clinic setting. A special MedGuide will be given to you before each treatment. Be sure to read this information carefully each time. For Prolia, talk to your pediatrician regarding the use of this medicine in children. Special care may be needed. For Xgeva, talk to your pediatrician regarding the use of this medicine in children. While this drug may be prescribed for children as young as 13 years for selected conditions, precautions do apply. Overdosage: If you think you have taken too much of this medicine contact a poison control center or emergency room at once. NOTE: This medicine is only for you. Do not share this medicine with others. What if I miss a dose? It is  important not to miss your dose. Call your doctor or health care professional if you are unable to keep an appointment. What may interact with this medicine? Do not take this medicine with any of the following medications:  other medicines containing denosumab This medicine may also interact with the following medications:  medicines that lower your chance of fighting infection  steroid medicines like prednisone or cortisone This list may not describe all possible interactions. Give your health care provider a list of all the medicines, herbs, non-prescription drugs, or dietary supplements you use. Also tell them if you smoke, drink alcohol, or use illegal drugs. Some items may interact with your medicine. What should I watch for while using this medicine? Visit your doctor or health care professional for regular checks on your progress. Your doctor or health care professional may order blood tests and other tests to see how you are doing. Call your doctor or health care professional for advice if you get a fever, chills or sore throat, or other symptoms of a cold or flu. Do not treat yourself. This drug may decrease your body's ability to fight infection. Try to avoid being around people who are sick. You should make sure you get enough calcium and vitamin D while you are taking this medicine, unless your doctor tells you not to. Discuss the foods you eat and the vitamins you take with your health care professional. See your dentist regularly. Brush and floss your teeth as directed. Before you have any dental work done, tell your dentist you are   receiving this medicine. Do not become pregnant while taking this medicine or for 5 months after stopping it. Talk with your doctor or health care professional about your birth control options while taking this medicine. Women should inform their doctor if they wish to become pregnant or think they might be pregnant. There is a potential for serious side  effects to an unborn child. Talk to your health care professional or pharmacist for more information. What side effects may I notice from receiving this medicine? Side effects that you should report to your doctor or health care professional as soon as possible:  allergic reactions like skin rash, itching or hives, swelling of the face, lips, or tongue  bone pain  breathing problems  dizziness  jaw pain, especially after dental work  redness, blistering, peeling of the skin  signs and symptoms of infection like fever or chills; cough; sore throat; pain or trouble passing urine  signs of low calcium like fast heartbeat, muscle cramps or muscle pain; pain, tingling, numbness in the hands or feet; seizures  unusual bleeding or bruising  unusually weak or tired Side effects that usually do not require medical attention (report to your doctor or health care professional if they continue or are bothersome):  constipation  diarrhea  headache  joint pain  loss of appetite  muscle pain  runny nose  tiredness  upset stomach This list may not describe all possible side effects. Call your doctor for medical advice about side effects. You may report side effects to FDA at 1-800-FDA-1088. Where should I keep my medicine? This medicine is only given in a clinic, doctor's office, or other health care setting and will not be stored at home. NOTE: This sheet is a summary. It may not cover all possible information. If you have questions about this medicine, talk to your doctor, pharmacist, or health care provider.  2020 Elsevier/Gold Standard (2017-05-25 16:10:44) Daratumumab injection What is this medicine? DARATUMUMAB (dar a toom ue mab) is a monoclonal antibody. It is used to treat multiple myeloma. This medicine may be used for other purposes; ask your health care provider or pharmacist if you have questions. COMMON BRAND NAME(S): DARZALEX What should I tell my health care  provider before I take this medicine? They need to know if you have any of these conditions:  infection (especially a virus infection such as chickenpox, herpes, or hepatitis B virus)  lung or breathing disease  an unusual or allergic reaction to daratumumab, other medicines, foods, dyes, or preservatives  pregnant or trying to get pregnant  breast-feeding How should I use this medicine? This medicine is for infusion into a vein. It is given by a health care professional in a hospital or clinic setting. Talk to your pediatrician regarding the use of this medicine in children. Special care may be needed. Overdosage: If you think you have taken too much of this medicine contact a poison control center or emergency room at once. NOTE: This medicine is only for you. Do not share this medicine with others. What if I miss a dose? Keep appointments for follow-up doses as directed. It is important not to miss your dose. Call your doctor or health care professional if you are unable to keep an appointment. What may interact with this medicine? Interactions have not been studied. This list may not describe all possible interactions. Give your health care provider a list of all the medicines, herbs, non-prescription drugs, or dietary supplements you use. Also tell them if  you smoke, drink alcohol, or use illegal drugs. Some items may interact with your medicine. What should I watch for while using this medicine? This drug may make you feel generally unwell. Report any side effects. Continue your course of treatment even though you feel ill unless your doctor tells you to stop. This medicine can cause serious allergic reactions. To reduce your risk you may need to take medicine before treatment with this medicine. Take your medicine as directed. This medicine can affect the results of blood tests to match your blood type. These changes can last for up to 6 months after the final dose. Your healthcare  provider will do blood tests to match your blood type before you start treatment. Tell all of your healthcare providers that you are being treated with this medicine before receiving a blood transfusion. This medicine can affect the results of some tests used to determine treatment response; extra tests may be needed to evaluate response. Do not become pregnant while taking this medicine or for 3 months after stopping it. Women should inform their doctor if they wish to become pregnant or think they might be pregnant. There is a potential for serious side effects to an unborn child. Talk to your health care professional or pharmacist for more information. What side effects may I notice from receiving this medicine? Side effects that you should report to your doctor or health care professional as soon as possible:  allergic reactions like skin rash, itching or hives, swelling of the face, lips, or tongue  breathing problems  chills  cough  dizziness  feeling faint or lightheaded  headache  low blood counts - this medicine may decrease the number of white blood cells, red blood cells and platelets. You may be at increased risk for infections and bleeding.  nausea, vomiting  shortness of breath  signs of decreased platelets or bleeding - bruising, pinpoint red spots on the skin, black, tarry stools, blood in the urine  signs of decreased red blood cells - unusually weak or tired, feeling faint or lightheaded, falls  signs of infection - fever or chills, cough, sore throat, pain or difficulty passing urine  signs and symptoms of liver injury like dark yellow or brown urine; general ill feeling or flu-like symptoms; light-colored stools; loss of appetite; right upper belly pain; unusually weak or tired; yellowing of the eyes or skin Side effects that usually do not require medical attention (report to your doctor or health care professional if they continue or are bothersome):  back  pain  constipation  loss of appetite  diarrhea  joint pain  muscle cramps  pain, tingling, numbness in the hands or feet  swelling of the ankles, feet, hands  tiredness  trouble sleeping This list may not describe all possible side effects. Call your doctor for medical advice about side effects. You may report side effects to FDA at 1-800-FDA-1088. Where should I keep my medicine? Keep out of the reach of children. This drug is given in a hospital or clinic and will not be stored at home. NOTE: This sheet is a summary. It may not cover all possible information. If you have questions about this medicine, talk to your doctor, pharmacist, or health care provider.  2020 Elsevier/Gold Standard (2017-11-01 14:00:48)

## 2018-08-29 NOTE — Patient Instructions (Signed)

## 2018-08-30 LAB — KAPPA/LAMBDA LIGHT CHAINS
Kappa free light chain: 5.5 mg/L (ref 3.3–19.4)
Kappa, lambda light chain ratio: 0.09 — ABNORMAL LOW (ref 0.26–1.65)
Lambda free light chains: 58.7 mg/L — ABNORMAL HIGH (ref 5.7–26.3)

## 2018-08-30 LAB — IGG, IGA, IGM
IgA: 16 mg/dL — ABNORMAL LOW (ref 87–352)
IgG (Immunoglobin G), Serum: 374 mg/dL — ABNORMAL LOW (ref 586–1602)
IgM (Immunoglobulin M), Srm: <5 mg/dL — ABNORMAL LOW (ref 26–217)

## 2018-09-02 LAB — PROTEIN ELECTROPHORESIS, SERUM, WITH REFLEX
A/G Ratio: 2.1 — ABNORMAL HIGH (ref 0.7–1.7)
Albumin ELP: 4 g/dL (ref 2.9–4.4)
Alpha-1-Globulin: 0.1 g/dL (ref 0.0–0.4)
Alpha-2-Globulin: 0.7 g/dL (ref 0.4–1.0)
Beta Globulin: 0.8 g/dL (ref 0.7–1.3)
Gamma Globulin: 0.2 g/dL — ABNORMAL LOW (ref 0.4–1.8)
Globulin, Total: 1.9 g/dL — ABNORMAL LOW (ref 2.2–3.9)
M-Spike, %: 0.2 g/dL — ABNORMAL HIGH
SPEP Interpretation: 0
Total Protein ELP: 5.9 g/dL — ABNORMAL LOW (ref 6.0–8.5)

## 2018-09-02 LAB — IMMUNOFIXATION REFLEX, SERUM
IgA: 17 mg/dL — ABNORMAL LOW (ref 87–352)
IgG (Immunoglobin G), Serum: 441 mg/dL — ABNORMAL LOW (ref 586–1602)
IgM (Immunoglobulin M), Srm: <5 mg/dL — ABNORMAL LOW (ref 26–217)

## 2018-09-18 ENCOUNTER — Ambulatory Visit (INDEPENDENT_AMBULATORY_CARE_PROVIDER_SITE_OTHER): Payer: Medicare Other | Admitting: Internal Medicine

## 2018-09-18 ENCOUNTER — Encounter: Payer: Self-pay | Admitting: Internal Medicine

## 2018-09-18 ENCOUNTER — Ambulatory Visit (INDEPENDENT_AMBULATORY_CARE_PROVIDER_SITE_OTHER): Payer: Medicare Other

## 2018-09-18 ENCOUNTER — Other Ambulatory Visit: Payer: Self-pay

## 2018-09-18 VITALS — BP 130/82 | HR 81 | Temp 98.2°F | Ht 62.8 in | Wt 195.2 lb

## 2018-09-18 DIAGNOSIS — E6609 Other obesity due to excess calories: Secondary | ICD-10-CM | POA: Diagnosis not present

## 2018-09-18 DIAGNOSIS — I10 Essential (primary) hypertension: Secondary | ICD-10-CM

## 2018-09-18 DIAGNOSIS — Z6834 Body mass index (BMI) 34.0-34.9, adult: Secondary | ICD-10-CM

## 2018-09-18 DIAGNOSIS — Z23 Encounter for immunization: Secondary | ICD-10-CM | POA: Diagnosis not present

## 2018-09-18 DIAGNOSIS — Z0000 Encounter for general adult medical examination without abnormal findings: Secondary | ICD-10-CM

## 2018-09-18 MED ORDER — BOOSTRIX 5-2.5-18.5 LF-MCG/0.5 IM SUSP: 0.5000 mL | Freq: Once | INTRAMUSCULAR | 0 refills | Status: AC

## 2018-09-18 NOTE — Patient Instructions (Signed)
Exercising to Lose Weight Exercise is structured, repetitive physical activity to improve fitness and health. Getting regular exercise is important for everyone. It is especially important if you are overweight. Being overweight increases your risk of heart disease, stroke, diabetes, high blood pressure, and several types of cancer. Reducing your calorie intake and exercising can help you lose weight. Exercise is usually categorized as moderate or vigorous intensity. To lose weight, most people need to do a certain amount of moderate-intensity or vigorous-intensity exercise each week. Moderate-intensity exercise  Moderate-intensity exercise is any activity that gets you moving enough to burn at least three times more energy (calories) than if you were sitting. Examples of moderate exercise include:  Walking a mile in 15 minutes.  Doing light yard work.  Biking at an easy pace. Most people should get at least 150 minutes (2 hours and 30 minutes) a week of moderate-intensity exercise to maintain their body weight. Vigorous-intensity exercise Vigorous-intensity exercise is any activity that gets you moving enough to burn at least six times more calories than if you were sitting. When you exercise at this intensity, you should be working hard enough that you are not able to carry on a conversation. Examples of vigorous exercise include:  Running.  Playing a team sport, such as football, basketball, and soccer.  Jumping rope. Most people should get at least 75 minutes (1 hour and 15 minutes) a week of vigorous-intensity exercise to maintain their body weight. How can exercise affect me? When you exercise enough to burn more calories than you eat, you lose weight. Exercise also reduces body fat and builds muscle. The more muscle you have, the more calories you burn. Exercise also:  Improves mood.  Reduces stress and tension.  Improves your overall fitness, flexibility, and endurance.   Increases bone strength. The amount of exercise you need to lose weight depends on:  Your age.  The type of exercise.  Any health conditions you have.  Your overall physical ability. Talk to your health care provider about how much exercise you need and what types of activities are safe for you. What actions can I take to lose weight? Nutrition   Make changes to your diet as told by your health care provider or diet and nutrition specialist (dietitian). This may include: ? Eating fewer calories. ? Eating more protein. ? Eating less unhealthy fats. ? Eating a diet that includes fresh fruits and vegetables, whole grains, low-fat dairy products, and lean protein. ? Avoiding foods with added fat, salt, and sugar.  Drink plenty of water while you exercise to prevent dehydration or heat stroke. Activity  Choose an activity that you enjoy and set realistic goals. Your health care provider can help you make an exercise plan that works for you.  Exercise at a moderate or vigorous intensity most days of the week. ? The intensity of exercise may vary from person to person. You can tell how intense a workout is for you by paying attention to your breathing and heartbeat. Most people will notice their breathing and heartbeat get faster with more intense exercise.  Do resistance training twice each week, such as: ? Push-ups. ? Sit-ups. ? Lifting weights. ? Using resistance bands.  Getting short amounts of exercise can be just as helpful as long structured periods of exercise. If you have trouble finding time to exercise, try to include exercise in your daily routine. ? Get up, stretch, and walk around every 30 minutes throughout the day. ? Go for a   walk during your lunch break. ? Park your car farther away from your destination. ? If you take public transportation, get off one stop early and walk the rest of the way. ? Make phone calls while standing up and walking around. ? Take the  stairs instead of elevators or escalators.  Wear comfortable clothes and shoes with good support.  Do not exercise so much that you hurt yourself, feel dizzy, or get very short of breath. Where to find more information  [place] Department of Health and Human Services: www.hhs.gov  Centers for Disease Control and Prevention (CDC): www.cdc.gov Contact a health care provider:  Before starting a new exercise program.  If you have questions or concerns about your weight.  If you have a medical problem that keeps you from exercising. Get help right away if you have any of the following while exercising:  Injury.  Dizziness.  Difficulty breathing or shortness of breath that does not go away when you stop exercising.  Chest pain.  Rapid heartbeat. Summary  Being overweight increases your risk of heart disease, stroke, diabetes, high blood pressure, and several types of cancer.  Losing weight happens when you burn more calories than you eat.  Reducing the amount of calories you eat in addition to getting regular moderate or vigorous exercise each week helps you lose weight. This information is not intended to replace advice given to you by your health care provider. Make sure you discuss any questions you have with your health care provider. Document Released: 02/18/2010 Document Revised: 01/29/2017 Document Reviewed: 01/29/2017 Elsevier Patient Education  2020 [employer]  

## 2018-09-18 NOTE — Progress Notes (Signed)
Subjective:   Emily Greer is a 68 y.o. female who presents for Medicare Annual (Subsequent) preventive examination.  Review of Systems:  n/a Cardiac Risk Factors include: advanced age (>16men, >27 women);hypertension;sedentary lifestyle;obesity (BMI >30kg/m2)     Objective:     Vitals: BP 130/82    Pulse 81    Temp 98.2 F (36.8 C) (Oral)    Ht 5' 2.8" (1.595 m)    Wt 195 lb 3.2 oz (88.5 kg)    BMI 34.80 kg/m   Body mass index is 34.8 kg/m.  Advanced Directives 09/18/2018 08/29/2018 08/15/2018 07/18/2018 07/04/2018 06/06/2018 05/23/2018  Does Patient Have a Medical Advance Directive? No No No No No No No  Would patient like information on creating a medical advance directive? - No - Patient declined No - Patient declined No - Patient declined - - No - Patient declined  Pre-existing out of facility DNR order (yellow form or pink MOST form) - - - - - - -    Tobacco Social History   Tobacco Use  Smoking Status Never Smoker  Smokeless Tobacco Never Used     Counseling given: Not Answered   Clinical Intake:  Pre-visit preparation completed: Yes  Pain : No/denies pain     Nutritional Status: BMI > 30  Obese Nutritional Risks: None Diabetes: No  How often do you need to have someone help you when you read instructions, pamphlets, or other written materials from your doctor or pharmacy?: 1 - Never What is the last grade level you completed in school?: 2 master degrees  Interpreter Needed?: No  Information entered by :: NAllen LPN  Past Medical History:  Diagnosis Date   Avascular necrosis of femoral head (HCC)    bilateral per CT 07-26-2015   Endometrial carcinoma [facility]) gyn oncologist-  dr Nelly Rout ([facility])/  radiation oncologist-- dr Roselind Messier   2013 dx  FIGO Stage 1A, Grade 2 endometrioid endometrial cancer s/p TAH w/ BSO and bilateral pelvic node dissection 10-31-2011 ;  recurrence at distal vagina 04/ 2014 s/p  brachytherapy (ended 07-29-2012);  2nd  recurrence 12/ 2016  vaginal apex s/p  conformational radiotherapy 03-10-2015 to 04-20-2015   Family history of adverse reaction to anesthesia    mother ponv   GERD (gastroesophageal reflux disease)    H/O stem cell transplant (HCC)    02/ 2000 and second one 06/ 2016   History of bacteremia    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   History of chemotherapy    last chemo 12-26-17   History of radiation therapy 6/4, 6/11, 6/19, 6/25, 07/29/2012   vagina 30.5 gray in 5 fx, HDR brachytherapy:   last radiation to vagina 03-10-2015 to 04-20-2015  50.4gray   History of radiation therapy 03/10/15-04/20/15   vagina 50.4 in 28 fractions   Hypertension    [facility]) oncologist-  dr Myna Hidalgo ([facility])  and  [facility] -- dr Silvestre Mesi gasparetto   dx 07/ 1999 s/p  VAD chemotherapy 11/ 1999,  purged autotransplant 03-21-1998 followed by auto stem cell transplant 03-29-1998;  recurrance w/ second autologous stem cell transplant 07-24-2014;  in Re-mission currently , chemo maintenance therapy   Osteoporosis 12/18/05   Increased  risk    PONV (postoperative nausea and vomiting)    Wears glasses    Past Surgical History:  Procedure Laterality Date   ABDOMINAL HYSTERECTOMY  10/31/2011   Procedure: HYSTERECTOMY ABDOMINAL;  Surgeon: Laurette Schimke, MD PHD;  Location:  [facility];  Service: Gynecology;  Laterality: N/A;   CO2 LASER APPLICATION N/A 02/22/2016   Procedure: CO2 LASER APPLICATION;  Surgeon: Laurette Schimke, MD;  Location: [facility];  Service: Gynecology;  Laterality: N/A;   CYSTOSCOPY WITH RETROGRADE PYELOGRAM, URETEROSCOPY AND STENT PLACEMENT Left 02/04/2018   Procedure: CYSTOSCOPY WITH RETROGRADE PYELOGRAM, URETEROSCOPY , bladder biopsy and fulgeration;  Surgeon: Malen Gauze, MD;  Location: [facility];  Service: Urology;  Laterality: Left;   ECTOPIC PREGNANCY SURGERY  1992   HYSTEROSCOPY W/D&C  09/27/2011     Procedure: DILATATION AND CURETTAGE /HYSTEROSCOPY;  Surgeon: Hal Morales, MD;  Location: [facility];  Service: Gynecology;;   IR FLUORO GUIDE PORT INSERTION RIGHT  09/08/2016   IR US GUIDE VASC ACCESS RIGHT  09/08/2016   LAPAROTOMY  10/31/2011   Procedure: EXPLORATORY LAPAROTOMY;  Surgeon: Laurette Schimke, MD PHD;  Location: [facility];  Service: Gynecology;  Laterality: N/A;   SALPINGOOPHORECTOMY  10/31/2011   Procedure: SALPINGO OOPHERECTOMY;  Surgeon: Laurette Schimke, MD PHD;  Location: [facility];  Service: Gynecology;  Laterality: Bilateral;   Lymph Nodes sampling   TUBAL LIGATION  1986   Family History  Problem Relation Age of Onset   Colon cancer Mother    Hypertension Father    Heart Problems Father    Social History   Socioeconomic History   Marital status: Married    Spouse name: Not on file   Number of children: Not on file   Years of education: Not on file   Highest education level: Not on file  Occupational History   Occupation: retired  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 at [employer] insecurity    Worry: Never true    Inability: Never true   Transportation needs    Medical: No    Non-medical: No  Tobacco Use   Smoking status: Never Smoker   Smokeless tobacco: Never Used  Substance and Sexual Activity   Alcohol use: Yes    Alcohol/week: 1.0 standard drinks    Types: 1 Glasses of wine per week    Comment: occasional   Drug use: Never   Sexual activity: Yes  Lifestyle   Physical activity    Days per week: 0 days    Minutes per session: 0 min   Stress: Not at all  Relationships   Social connections    Talks on phone: Not on file    Gets together: Not on file    Attends religious service: Not on file    Active member of club or organization: Not on file    Attends meetings of clubs or organizations: Not on file    Relationship status: Not on file  Other Topics Concern   Not on file  Social History Narrative   Not on  file    Outpatient Encounter Medications as of 09/18/2018  Medication Sig   acyclovir (ZOVIRAX) 400 MG tablet Take 1 tablet (400 mg total) by mouth 2 (two) times daily.   albuterol (PROVENTIL HFA;VENTOLIN HFA) 108 (90 Base) MCG/ACT inhaler Inhale 2 puffs into the lungs every 6 (six) hours as needed for wheezing or shortness of breath. 2 puffs 3 times daily x 5 days then every 6 hours as needed.   amLODipine (NORVASC) 10 MG tablet TAKE 1 TABLET BY MOUTH EVERY MORNING   aspirin EC 81 MG tablet Take 81 mg by mouth 2 (two) times daily. Stopped  By dr Myna Hidalgo until urology procedures  complete   Calcium-Magnesium-Vitamin D (CALCIUM 1200+D3 PO) Take 1 tablet by mouth daily.   Cholecalciferol (VITAMIN D3) 2000 UNITS TABS Take 2 tablets by mouth daily.    letrozole (FEMARA) 2.5 MG tablet Take 1 tablet (2.5 mg total) by mouth daily.   loperamide (IMODIUM) 2 MG capsule Take by mouth as needed for diarrhea or loose stools. Reported on 08/19/2015   loratadine (CLARITIN) 10 MG tablet Take 10 mg by mouth every morning. Reported on 08/19/2015   LORazepam (ATIVAN) 0.5 MG tablet Take 1 tablet (0.5 mg total) by mouth every 6 (six) hours as needed (Nausea or vomiting).   montelukast (SINGULAIR) 10 MG tablet TAKE 1 TABLET(10 MG) BY MOUTH AT BEDTIME   pomalidomide (POMALYST) 1 MG capsule TAKE 1 CAPSULE BY MOUTH ONCE DAILY FOR 21 DAYS ON AND 7 DAYS OFF-AUTH #[id number]   potassium chloride SA (K-DUR) 20 MEQ tablet TAKE 1 TABLET(20 MEQ) BY MOUTH TWICE DAILY   traMADol (ULTRAM) 50 MG tablet Take 1 tablet (50 mg total) by mouth every 6 (six) hours as needed.   triamcinolone (NASACORT ALLERGY 24HR) 55 MCG/ACT AERO nasal inhaler Place 2 sprays into the nose as needed.    triamterene-hydrochlorothiazide (MAXZIDE-25) 37.5-25 MG tablet 1 tablet by mouth at bedtime   Facility-Administered Encounter Medications as of 09/18/2018  Medication   sodium chloride flush (NS) 0.9 % injection 10 mL    Activities of  Daily Living In your present state of health, do you have any difficulty performing the following activities: 09/18/2018 02/04/2018  Hearing? N N  Vision? N N  Difficulty concentrating or making decisions? N N  Walking or climbing stairs? N N  Dressing or bathing? N N  Doing errands, shopping? N -  Preparing Food and eating ? N -  Using the Toilet? N -  In the past six months, have you accidently leaked urine? N -  Do you have problems with loss of bowel control? N -  Managing your Medications? N -  Managing your Finances? N -  Housekeeping or managing your Housekeeping? N -  Some recent data might be hidden    Patient Care Team: [name], MD as PCP - General (Internal Medicine)    Assessment:   This is a routine wellness examination for Shaquesha.  Exercise Activities and Dietary recommendations Current Exercise Habits: The patient does not participate in regular exercise at present  Goals     Weight (lb) < 200 lb (90.7 kg)     09/18/2018, wants to get to normal BMI       Fall Risk Fall Risk  09/18/2018 03/19/2018 02/18/2018 12/03/2017 11/03/2017  Falls in the past year? 0 0 0 0 No  Risk for fall due to : Medication side effect - - - -   Is the patient's home free of loose throw rugs in walkways, pet beds, electrical cords, etc?   yes      Grab bars in the bathroom? yes      Handrails on the stairs?   yes      Adequate lighting?   yes  Timed Get Up and Go performed: n/a  Depression Screen PHQ 2/9 Scores 09/18/2018 03/19/2018 02/18/2018 12/03/2017  PHQ - 2 Score 0 0 0 0     Cognitive Function     6CIT Screen 09/18/2018  What Year? 0 points  What month? 0 points  What time? 0 points  Count back from 20 0 points  Months in reverse 0 points  Repeat phrase  0 points  Total Score 0    Immunization History  Administered Date(s) Administered   DTaP 08/25/2015, 10/28/2015   Hepatitis B, adult 08/25/2015, 10/28/2015   HiB (PRP-T) 08/25/2015, 10/28/2015   IPV  08/25/2015, 10/28/2015   Influenza Split 11/01/2011   Influenza,inj,Quad PF,6+ Mos 11/03/2015, 10/31/2017   Influenza-Unspecified 10/30/2013, 10/28/2014   Pneumococcal Conjugate-13 02/12/2015, 08/25/2015, 10/28/2015   Pneumococcal Polysaccharide-23 11/05/2013    Qualifies for Shingles Vaccine? yes  Screening Tests Health Maintenance  Topic Date Due   Hepatitis C Screening  1950-05-23   TETANUS/TDAP  05/19/1971   MAMMOGRAM  08/04/2012   DEXA SCAN  05/18/2017   INFLUENZA VACCINE  08/31/2018   PNA vac Low Risk Adult (2 of 2 - PPSV23) 11/06/2018   COLONOSCOPY  08/11/2027    Cancer Screenings: Lung: Low Dose CT Chest recommended if Age 69-80 years, 30 pack-year currently smoking OR have quit w/in 15years. Patient does not qualify. Breast:  Up to date on Mammogram? No   Up to date of Bone Density/Dexa? No Colorectal: up to date  Additional Screenings: : Hepatitis C Screening: declined at this time     Plan:    Patient wants to get to a normal BMI.   I have personally reviewed and noted the following in the patients chart:    Medical and social history  Use of alcohol, tobacco or illicit drugs   Current medications and supplements  Functional ability and status  Nutritional status  Physical activity  Advanced directives  List of other physicians  Hospitalizations, surgeries, and ER visits in previous 12 months  Vitals  Screenings to include cognitive, depression, and falls  Referrals and appointments  In addition, I have reviewed and discussed with patient certain preventive protocols, quality metrics, and best practice recommendations. A written personalized care plan for preventive services as well as general preventive health recommendations were provided to patient.     Barb Merino, LPN  [phone]/[id number]

## 2018-09-18 NOTE — Patient Instructions (Signed)
Emily Greer , Thank you for taking time to come for your Medicare Wellness Visit. I appreciate your ongoing commitment to your health goals. Please review the following plan we discussed and let me know if I can assist you in the future.   Screening recommendations/referrals: Colonoscopy: 07/2017 Mammogram: due Bone Density: due Recommended yearly ophthalmology/optometry visit for glaucoma screening and checkup Recommended yearly dental visit for hygiene and checkup  Vaccinations: Influenza vaccine: 10/2017 Pneumococcal vaccine: 10/2015 Tdap vaccine: sent to pharmacy Shingles vaccine: discussed    Advanced directives: Advance directive discussed with you today. Even though you declined this today please call our office should you change your mind and we can give you the proper paperwork for you to fill out.   Conditions/risks identified: obesity  Next appointment: 09/18/2018   Preventive Care 29 Years and Older, Female Preventive care refers to lifestyle choices and visits with your health care provider that can promote health and wellness. What does preventive care include?  A yearly physical exam. This is also called an annual well check.  Dental exams once or twice a year.  Routine eye exams. Ask your health care provider how often you should have your eyes checked.  Personal lifestyle choices, including:  Daily care of your teeth and gums.  Regular physical activity.  Eating a healthy diet.  Avoiding tobacco and drug use.  Limiting alcohol use.  Practicing safe sex.  Taking low-dose aspirin every day.  Taking vitamin and mineral supplements as recommended by your health care provider. What happens during an annual well check? The services and screenings done by your health care provider during your annual well check will depend on your age, overall health, lifestyle risk factors, and family history of disease. Counseling  Your health care provider may ask you  questions about your:  Alcohol use.  Tobacco use.  Drug use.  Emotional well-being.  Home and relationship well-being.  Sexual activity.  Eating habits.  History of falls.  Memory and ability to understand (cognition).  Work and work Statistician.  Reproductive health. Screening  You may have the following tests or measurements:  Height, weight, and BMI.  Blood pressure.  Lipid and cholesterol levels. These may be checked every 5 years, or more frequently if you are over 42 years old.  Skin check.  Lung cancer screening. You may have this screening every year starting at age 18 if you have a 30-pack-year history of smoking and currently smoke or have quit within the past 15 years.  Fecal occult blood test (FOBT) of the stool. You may have this test every year starting at age 71.  Flexible sigmoidoscopy or colonoscopy. You may have a sigmoidoscopy every 5 years or a colonoscopy every 10 years starting at age 35.  Hepatitis C blood test.  Hepatitis B blood test.  Sexually transmitted disease (STD) testing.  Diabetes screening. This is done by checking your blood sugar (glucose) after you have not eaten for a while (fasting). You may have this done every 1-3 years.  Bone density scan. This is done to screen for osteoporosis. You may have this done starting at age 22.  Mammogram. This may be done every 1-2 years. Talk to your health care provider about how often you should have regular mammograms. Talk with your health care provider about your test results, treatment options, and if necessary, the need for more tests. Vaccines  Your health care provider may recommend certain vaccines, such as:  Influenza vaccine. This is recommended every  year.  Tetanus, diphtheria, and acellular pertussis (Tdap, Td) vaccine. You may need a Td booster every 10 years.  Zoster vaccine. You may need this after age 42.  Pneumococcal 13-valent conjugate (PCV13) vaccine. One dose is  recommended after age 19.  Pneumococcal polysaccharide (PPSV23) vaccine. One dose is recommended after age 72. Talk to your health care provider about which screenings and vaccines you need and how often you need them. This information is not intended to replace advice given to you by your health care provider. Make sure you discuss any questions you have with your health care provider. Document Released: 02/12/2015 Document Revised: 10/06/2015 Document Reviewed: 11/17/2014 Elsevier Interactive Patient Education  2017 [place] Prevention in the Home Falls can cause injuries. They can happen to people of all ages. There are many things you can do to make your home safe and to help prevent falls. What can I do on the outside of my home?  Regularly fix the edges of walkways and driveways and fix any cracks.  Remove anything that might make you trip as you walk through a door, such as a raised step or threshold.  Trim any bushes or trees on the path to your home.  Use bright outdoor lighting.  Clear any walking paths of anything that might make someone trip, such as rocks or tools.  Regularly check to see if handrails are loose or broken. Make sure that both sides of any steps have handrails.  Any raised decks and porches should have guardrails on the edges.  Have any leaves, snow, or ice cleared regularly.  Use sand or salt on walking paths during winter.  Clean up any spills in your garage right away. This includes oil or grease spills. What can I do in the bathroom?  Use night lights.  Install grab bars by the toilet and in the tub and shower. Do not use towel bars as grab bars.  Use non-skid mats or decals in the tub or shower.  If you need to sit down in the shower, use a plastic, non-slip stool.  Keep the floor dry. Clean up any water that spills on the floor as soon as it happens.  Remove soap buildup in the tub or shower regularly.  Attach bath mats  securely with double-sided non-slip rug tape.  Do not have throw rugs and other things on the floor that can make you trip. What can I do in the bedroom?  Use night lights.  Make sure that you have a light by your bed that is easy to reach.  Do not use any sheets or blankets that are too big for your bed. They should not hang down onto the floor.  Have a firm chair that has side arms. You can use this for support while you get dressed.  Do not have throw rugs and other things on the floor that can make you trip. What can I do in the kitchen?  Clean up any spills right away.  Avoid walking on wet floors.  Keep items that you use a lot in easy-to-reach places.  If you need to reach something above you, use a strong step stool that has a grab bar.  Keep electrical cords out of the way.  Do not use floor polish or wax that makes floors slippery. If you must use wax, use non-skid floor wax.  Do not have throw rugs and other things on the floor that can make you trip. What can  I do with my stairs?  Do not leave any items on the stairs.  Make sure that there are handrails on both sides of the stairs and use them. Fix handrails that are broken or loose. Make sure that handrails are as long as the stairways.  Check any carpeting to make sure that it is firmly attached to the stairs. Fix any carpet that is loose or worn.  Avoid having throw rugs at the top or bottom of the stairs. If you do have throw rugs, attach them to the floor with carpet tape.  Make sure that you have a light switch at the top of the stairs and the bottom of the stairs. If you do not have them, ask someone to add them for you. What else can I do to help prevent falls?  Wear shoes that:  Do not have high heels.  Have rubber bottoms.  Are comfortable and fit you well.  Are closed at the toe. Do not wear sandals.  If you use a stepladder:  Make sure that it is fully opened. Do not climb a closed  stepladder.  Make sure that both sides of the stepladder are locked into place.  Ask someone to hold it for you, if possible.  Clearly mark and make sure that you can see:  Any grab bars or handrails.  First and last steps.  Where the edge of each step is.  Use tools that help you move around (mobility aids) if they are needed. These include:  Canes.  Walkers.  Scooters.  Crutches.  Turn on the lights when you go into a dark area. Replace any light bulbs as soon as they burn out.  Set up your furniture so you have a clear path. Avoid moving your furniture around.  If any of your floors are uneven, fix them.  If there are any pets around you, be aware of where they are.  Review your medicines with your doctor. Some medicines can make you feel dizzy. This can increase your chance of falling. Ask your doctor what other things that you can do to help prevent falls. This information is not intended to replace advice given to you by your health care provider. Make sure you discuss any questions you have with your health care provider. Document Released: 11/12/2008 Document Revised: 06/24/2015 Document Reviewed: 02/20/2014 Elsevier Interactive Patient Education  2017 [employer].

## 2018-09-19 NOTE — Addendum Note (Signed)
Addended by: Michelle Nasuti on: 09/19/2018 07:54 AM   Modules accepted: Orders

## 2018-09-21 ENCOUNTER — Other Ambulatory Visit: Payer: Self-pay | Admitting: Hematology & Oncology

## 2018-09-21 DIAGNOSIS — E876 Hypokalemia: Secondary | ICD-10-CM

## 2018-09-22 NOTE — Progress Notes (Signed)
Subjective:     Patient ID: Emily Greer , female    DOB: 11/07/1950 , 68 y.o.   MRN: [id number]   Chief Complaint  Patient presents with  . Hypertension    HPI  Hypertension This is a chronic problem. The current episode started more than 1 year ago. The problem has been gradually improving since onset. The problem is controlled. Pertinent negatives include no blurred vision, chest pain, palpitations or shortness of breath. Risk factors for coronary artery disease include obesity, sedentary lifestyle, stress and post-menopausal state. Past treatments include calcium channel blockers and diuretics. The current treatment provides moderate improvement. Compliance problems include exercise.      Past Medical History:  Diagnosis Date  . Avascular necrosis of femoral head (HCC)    bilateral per CT 07-26-2015  . Endometrial carcinoma [facility]) gyn oncologist-  dr Nelly Rout ([facility])/  radiation oncologist-- dr Roselind Messier   2013 dx  FIGO Stage 1A, Grade 2 endometrioid endometrial cancer s/p TAH w/ BSO and bilateral pelvic node dissection 10-31-2011 ;  recurrence at distal vagina 04/ 2014 s/p  brachytherapy (ended 07-29-2012);  2nd recurrence 12/ 2016  vaginal apex s/p  conformational radiotherapy 03-10-2015 to 04-20-2015  . Family history of adverse reaction to anesthesia    mother ponv  . GERD (gastroesophageal reflux disease)   . H/O stem cell transplant (HCC)    02/ 2000 and second one 06/ 2016  . 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 History of chemotherapy    last chemo 12-26-17  . History of radiation therapy 6/4, 6/11, 6/19, 6/25, 07/29/2012   vagina 30.5 gray in 5 fx, HDR brachytherapy:   last radiation to vagina 03-10-2015 to 04-20-2015  50.4gray  . History of radiation therapy 03/10/15-04/20/15   vagina 50.4 in 28 fractions  . Hypertension   . Lambda light chain myeloma [facility]) oncologist-  dr Myna Hidalgo ([facility])  and   [facility] -- dr Silvestre Mesi gasparetto   dx 07/ 1999 s/p  VAD chemotherapy 11/ 1999,  purged autotransplant 03-21-1998 followed by auto stem cell transplant 03-29-1998;  recurrance w/ second autologous stem cell transplant 07-24-2014;  in Re-mission currently , chemo maintenance therapy  . Osteoporosis 12/18/05   Increased  risk   . PONV (postoperative nausea and vomiting)   . Wears glasses      Family History  Problem Relation Age of Onset  . Colon cancer Mother   . Hypertension Father   . Heart Problems Father      Current Outpatient Medications:  .  acyclovir (ZOVIRAX) 400 MG tablet, Take 1 tablet (400 mg total) by mouth 2 (two) times daily., Disp: 60 tablet, Rfl: 11 .  albuterol (PROVENTIL HFA;VENTOLIN HFA) 108 (90 Base) MCG/ACT inhaler, Inhale 2 puffs into the lungs every 6 (six) hours as needed for wheezing or shortness of breath. 2 puffs 3 times daily x 5 days then every 6 hours as needed., Disp: 1 Inhaler, Rfl: 3 .  amLODipine (NORVASC) 10 MG tablet, TAKE 1 TABLET BY MOUTH EVERY MORNING, Disp: 90 tablet, Rfl: 0 .  aspirin EC 81 MG tablet, Take 81 mg by mouth 2 (two) times daily. Stopped  By dr Myna Hidalgo until urology procedures complete, Disp: , Rfl:  .  Calcium-Magnesium-Vitamin D (CALCIUM 1200+D3 PO), Take 1 tablet by mouth daily., Disp: , Rfl:  .  Cholecalciferol (VITAMIN D3) 2000 UNITS TABS, Take 2 tablets by mouth daily. , Disp: , Rfl:  .  letrozole (FEMARA) 2.5 MG tablet, Take 1 tablet (2.5 mg total) by mouth daily., Disp: 60 tablet, Rfl: 4 .  loperamide (IMODIUM) 2 MG capsule, Take by mouth as needed for diarrhea or loose stools. Reported on 08/19/2015, Disp: , Rfl:  .  loratadine (CLARITIN) 10 MG tablet, Take 10 mg by mouth every morning. Reported on 08/19/2015, Disp: , Rfl:  .  LORazepam (ATIVAN) 0.5 MG tablet, Take 1 tablet (0.5 mg total) by mouth every 6 (six) hours as needed (Nausea or vomiting)., Disp: 30 tablet, Rfl: 0 .  montelukast (SINGULAIR) 10 MG tablet, TAKE 1 TABLET(10 MG)  BY MOUTH AT BEDTIME, Disp: 30 tablet, Rfl: 1 .  pomalidomide (POMALYST) 1 MG capsule, TAKE 1 CAPSULE BY MOUTH ONCE DAILY FOR 21 DAYS ON AND 7 DAYS OFF-AUTH #[id number], Disp: 21 capsule, Rfl: 0 .  potassium chloride SA (K-DUR) 20 MEQ tablet, TAKE 1 TABLET(20 MEQ) BY MOUTH TWICE DAILY, Disp: 60 tablet, Rfl: 0 .  traMADol (ULTRAM) 50 MG tablet, Take 1 tablet (50 mg total) by mouth every 6 (six) hours as needed., Disp: 30 tablet, Rfl: 0 .  triamcinolone (NASACORT ALLERGY 24HR) 55 MCG/ACT AERO nasal inhaler, Place 2 sprays into the nose as needed. , Disp: , Rfl:  .  triamterene-hydrochlorothiazide (MAXZIDE-25) 37.5-25 MG tablet, 1 tablet by mouth at bedtime, Disp: 90 tablet, Rfl: 1 No current facility-administered medications for this visit.   Facility-Administered Medications Ordered in Other Visits:  .  sodium chloride flush (NS) 0.9 % injection 10 mL, 10 mL, Intravenous, PRN, [place], Sarah M, NP, 10 mL at 12/26/17 1130   Allergies  Allergen Reactions  . Codeine Nausea Only     Review of Systems  Constitutional: Negative.   Eyes: Negative for blurred vision.  Respiratory: Negative.  Negative for shortness of breath.   Cardiovascular: Negative.  Negative for chest pain and palpitations.  Gastrointestinal: Negative.   Neurological: Negative.   Psychiatric/Behavioral: Negative.      Today's Vitals   09/18/18 1153  BP: 130/82  Pulse: 81  Temp: 98.2 F (36.8 C)  TempSrc: Oral  Weight: 195 lb 3.2 oz (88.5 kg)  Height: 5' 2.8" (1.595 m)  PainSc: 5   PainLoc: Head   Body mass index is 34.8 kg/m.   Objective:  Physical Exam Vitals signs and nursing note reviewed.  Constitutional:      Appearance: Normal appearance.  HENT:     Head: Normocephalic and atraumatic.  Cardiovascular:     Rate and Rhythm: Normal rate and regular rhythm.     Heart sounds: Normal heart sounds.  Pulmonary:     Effort: Pulmonary effort is normal.     Breath sounds: Normal breath sounds.  Skin:     General: Skin is warm.  Neurological:     General: No focal deficit present.     Mental Status: She is alert.  Psychiatric:        Mood and Affect: Mood normal.        Behavior: Behavior normal.         Assessment And Plan:     1. Essential (primary) hypertension  Chronic, controlled. She will continue with current meds. I reviewed labs drawn by her oncologist. No add'l labs needed at this time.   2. Class 1 obesity due to excess calories without serious comorbidity with body mass index (BMI) of 34.0 to 34.9 in adult  She is encouraged to stirve for BMI less than 30 to decrease cardiac risk. She is encouraged to  incorporate more exercise into her daily routine.   Gwynneth Aliment, MD    THE PATIENT IS ENCOURAGED TO PRACTICE SOCIAL DISTANCING DUE TO THE COVID-19 PANDEMIC.

## 2018-09-26 ENCOUNTER — Inpatient Hospital Stay: Payer: Medicare Other

## 2018-09-26 ENCOUNTER — Inpatient Hospital Stay: Payer: Medicare Other | Admitting: Hematology & Oncology

## 2018-10-01 ENCOUNTER — Other Ambulatory Visit: Payer: Self-pay | Admitting: Hematology & Oncology

## 2018-10-03 ENCOUNTER — Inpatient Hospital Stay: Payer: Medicare Other

## 2018-10-03 ENCOUNTER — Inpatient Hospital Stay (HOSPITAL_BASED_OUTPATIENT_CLINIC_OR_DEPARTMENT_OTHER): Payer: Medicare Other | Admitting: Family

## 2018-10-03 ENCOUNTER — Other Ambulatory Visit: Payer: Self-pay

## 2018-10-03 ENCOUNTER — Other Ambulatory Visit: Payer: Self-pay | Admitting: *Deleted

## 2018-10-03 ENCOUNTER — Inpatient Hospital Stay: Payer: Medicare Other | Attending: Hematology & Oncology

## 2018-10-03 VITALS — BP 142/62 | HR 93 | Temp 97.6°F | Resp 18

## 2018-10-03 VITALS — BP 143/78 | HR 73

## 2018-10-03 DIAGNOSIS — K219 Gastro-esophageal reflux disease without esophagitis: Secondary | ICD-10-CM | POA: Insufficient documentation

## 2018-10-03 DIAGNOSIS — R232 Flushing: Secondary | ICD-10-CM | POA: Diagnosis not present

## 2018-10-03 DIAGNOSIS — M810 Age-related osteoporosis without current pathological fracture: Secondary | ICD-10-CM | POA: Diagnosis not present

## 2018-10-03 DIAGNOSIS — C9000 Multiple myeloma not having achieved remission: Secondary | ICD-10-CM | POA: Diagnosis not present

## 2018-10-03 DIAGNOSIS — Z90710 Acquired absence of both cervix and uterus: Secondary | ICD-10-CM | POA: Insufficient documentation

## 2018-10-03 DIAGNOSIS — C9001 Multiple myeloma in remission: Secondary | ICD-10-CM

## 2018-10-03 DIAGNOSIS — Z5112 Encounter for antineoplastic immunotherapy: Secondary | ICD-10-CM | POA: Diagnosis not present

## 2018-10-03 DIAGNOSIS — I10 Essential (primary) hypertension: Secondary | ICD-10-CM | POA: Diagnosis not present

## 2018-10-03 DIAGNOSIS — Z923 Personal history of irradiation: Secondary | ICD-10-CM | POA: Diagnosis not present

## 2018-10-03 DIAGNOSIS — D509 Iron deficiency anemia, unspecified: Secondary | ICD-10-CM | POA: Diagnosis not present

## 2018-10-03 DIAGNOSIS — Z79899 Other long term (current) drug therapy: Secondary | ICD-10-CM | POA: Diagnosis not present

## 2018-10-03 DIAGNOSIS — C541 Malignant neoplasm of endometrium: Secondary | ICD-10-CM

## 2018-10-03 DIAGNOSIS — Z79811 Long term (current) use of aromatase inhibitors: Secondary | ICD-10-CM | POA: Insufficient documentation

## 2018-10-03 DIAGNOSIS — Z90722 Acquired absence of ovaries, bilateral: Secondary | ICD-10-CM | POA: Diagnosis not present

## 2018-10-03 DIAGNOSIS — D500 Iron deficiency anemia secondary to blood loss (chronic): Secondary | ICD-10-CM

## 2018-10-03 LAB — CMP (CANCER CENTER ONLY)
ALT: 19 U/L (ref 0–44)
AST: 14 U/L — ABNORMAL LOW (ref 15–41)
Albumin: 4.3 g/dL (ref 3.5–5.0)
Alkaline Phosphatase: 32 U/L — ABNORMAL LOW (ref 38–126)
Anion gap: 10 (ref 5–15)
BUN: 17 mg/dL (ref 8–23)
CO2: 26 mmol/L (ref 22–32)
Calcium: 9.8 mg/dL (ref 8.9–10.3)
Chloride: 104 mmol/L (ref 98–111)
Creatinine: 0.94 mg/dL (ref 0.44–1.00)
GFR, Est AFR Am: >60 mL/min (ref >60)
GFR, Estimated: >60 mL/min (ref >60)
Glucose, Bld: 95 mg/dL (ref 70–99)
Potassium: 3.7 mmol/L (ref 3.5–5.1)
Sodium: 140 mmol/L (ref 135–145)
Total Bilirubin: 0.4 mg/dL (ref 0.3–1.2)
Total Protein: 6.3 g/dL — ABNORMAL LOW (ref 6.5–8.1)

## 2018-10-03 LAB — CBC WITH DIFFERENTIAL (CANCER CENTER ONLY)
Abs Immature Granulocytes: 0.02 10*3/uL (ref 0.00–0.07)
Basophils Absolute: 0.1 10*3/uL (ref 0.0–0.1)
Basophils Relative: 2 %
Eosinophils Absolute: 0.2 10*3/uL (ref 0.0–0.5)
Eosinophils Relative: 4 %
HCT: 35.1 % — ABNORMAL LOW (ref 36.0–46.0)
Hemoglobin: 11.4 g/dL — ABNORMAL LOW (ref 12.0–15.0)
Immature Granulocytes: 1 %
Lymphocytes Relative: 15 %
Lymphs Abs: 0.6 10*3/uL — ABNORMAL LOW (ref 0.7–4.0)
MCH: 26.9 pg (ref 26.0–34.0)
MCHC: 32.5 g/dL (ref 30.0–36.0)
MCV: 82.8 fL (ref 80.0–100.0)
Monocytes Absolute: 0.7 10*3/uL (ref 0.1–1.0)
Monocytes Relative: 16 %
Neutro Abs: 2.6 10*3/uL (ref 1.7–7.7)
Neutrophils Relative %: 62 %
Platelet Count: 219 10*3/uL (ref 150–400)
RBC: 4.24 MIL/uL (ref 3.87–5.11)
RDW: 17.1 % — ABNORMAL HIGH (ref 11.5–15.5)
WBC Count: 4.2 10*3/uL (ref 4.0–10.5)
nRBC: 0 % (ref 0.0–0.2)

## 2018-10-03 LAB — IRON AND TIBC
Iron: 93 ug/dL (ref 41–142)
Saturation Ratios: 26 % (ref 21–57)
TIBC: 354 ug/dL (ref 236–444)
UIBC: 261 ug/dL (ref 120–384)

## 2018-10-03 LAB — FERRITIN: Ferritin: 705 ng/mL — ABNORMAL HIGH (ref 11–307)

## 2018-10-03 MED ORDER — ACETAMINOPHEN 325 MG PO TABS
ORAL_TABLET | ORAL | Status: AC
  Filled 2018-10-03: qty 2

## 2018-10-03 MED ORDER — SODIUM CHLORIDE 0.9 % IV SOLN
Freq: Once | INTRAVENOUS | Status: AC
  Administered 2018-10-03: 10:00:00 via INTRAVENOUS
  Filled 2018-10-03: qty 250

## 2018-10-03 MED ORDER — METHYLPREDNISOLONE SODIUM SUCC 125 MG IJ SOLR
INTRAMUSCULAR | Status: AC
  Filled 2018-10-03: qty 2

## 2018-10-03 MED ORDER — HEPARIN SOD (PORK) LOCK FLUSH 100 UNIT/ML IV SOLN
500.0000 [IU] | Freq: Once | INTRAVENOUS | Status: AC | PRN
  Administered 2018-10-03: 500 [IU]
  Filled 2018-10-03: qty 5

## 2018-10-03 MED ORDER — SODIUM CHLORIDE 0.9 % IV SOLN
16.6000 mg/kg | Freq: Once | INTRAVENOUS | Status: AC
  Administered 2018-10-03: 11:00:00 1400 mg via INTRAVENOUS
  Filled 2018-10-03: qty 60

## 2018-10-03 MED ORDER — DIPHENHYDRAMINE HCL 25 MG PO CAPS
ORAL_CAPSULE | ORAL | Status: AC
  Filled 2018-10-03: qty 2

## 2018-10-03 MED ORDER — POMALIDOMIDE 1 MG PO CAPS: ORAL_CAPSULE | ORAL | 0 refills | Status: DC

## 2018-10-03 MED ORDER — ACETAMINOPHEN 325 MG PO TABS
650.0000 mg | ORAL_TABLET | Freq: Once | ORAL | Status: AC
  Administered 2018-10-03: 650 mg via ORAL

## 2018-10-03 MED ORDER — DIPHENHYDRAMINE HCL 25 MG PO CAPS
50.0000 mg | ORAL_CAPSULE | Freq: Once | ORAL | Status: AC
  Administered 2018-10-03: 10:00:00 50 mg via ORAL

## 2018-10-03 MED ORDER — METHYLPREDNISOLONE SODIUM SUCC 125 MG IJ SOLR
100.0000 mg | Freq: Once | INTRAMUSCULAR | Status: AC
  Administered 2018-10-03: 100 mg via INTRAVENOUS

## 2018-10-03 MED ORDER — SODIUM CHLORIDE 0.9% FLUSH
10.0000 mL | Freq: Once | INTRAVENOUS | Status: AC
  Administered 2018-10-03: 10 mL
  Filled 2018-10-03: qty 10

## 2018-10-03 MED ORDER — SODIUM CHLORIDE 0.9% FLUSH
10.0000 mL | INTRAVENOUS | Status: DC | PRN
  Filled 2018-10-03: qty 10

## 2018-10-03 NOTE — Patient Instructions (Signed)
Daratumumab injection What is this medicine? DARATUMUMAB (dar a toom ue mab) is a monoclonal antibody. It is used to treat multiple myeloma. This medicine may be used for other purposes; ask your health care provider or pharmacist if you have questions. COMMON BRAND NAME(S): DARZALEX What should I tell my health care provider before I take this medicine? They need to know if you have any of these conditions:  infection (especially a virus infection such as chickenpox, herpes, or hepatitis B virus)  lung or breathing disease  an unusual or allergic reaction to daratumumab, other medicines, foods, dyes, or preservatives  pregnant or trying to get pregnant  breast-feeding How should I use this medicine? This medicine is for infusion into a vein. It is given by a health care professional in a hospital or clinic setting. Talk to your pediatrician regarding the use of this medicine in children. Special care may be needed. Overdosage: If you think you have taken too much of this medicine contact a poison control center or emergency room at once. NOTE: This medicine is only for you. Do not share this medicine with others. What if I miss a dose? Keep appointments for follow-up doses as directed. It is important not to miss your dose. Call your doctor or health care professional if you are unable to keep an appointment. What may interact with this medicine? Interactions have not been studied. This list may not describe all possible interactions. Give your health care provider a list of all the medicines, herbs, non-prescription drugs, or dietary supplements you use. Also tell them if you smoke, drink alcohol, or use illegal drugs. Some items may interact with your medicine. What should I watch for while using this medicine? This drug may make you feel generally unwell. Report any side effects. Continue your course of treatment even though you feel ill unless your doctor tells you to stop. This  medicine can cause serious allergic reactions. To reduce your risk you may need to take medicine before treatment with this medicine. Take your medicine as directed. This medicine can affect the results of blood tests to match your blood type. These changes can last for up to 6 months after the final dose. Your healthcare provider will do blood tests to match your blood type before you start treatment. Tell all of your healthcare providers that you are being treated with this medicine before receiving a blood transfusion. This medicine can affect the results of some tests used to determine treatment response; extra tests may be needed to evaluate response. Do not become pregnant while taking this medicine or for 3 months after stopping it. Women should inform their doctor if they wish to become pregnant or think they might be pregnant. There is a potential for serious side effects to an unborn child. Talk to your health care professional or pharmacist for more information. What side effects may I notice from receiving this medicine? Side effects that you should report to your doctor or health care professional as soon as possible:  allergic reactions like skin rash, itching or hives, swelling of the face, lips, or tongue  breathing problems  chills  cough  dizziness  feeling faint or lightheaded  headache  low blood counts - this medicine may decrease the number of white blood cells, red blood cells and platelets. You may be at increased risk for infections and bleeding.  nausea, vomiting  shortness of breath  signs of decreased platelets or bleeding - bruising, pinpoint red spots on  the skin, black, tarry stools, blood in the urine  signs of decreased red blood cells - unusually weak or tired, feeling faint or lightheaded, falls  signs of infection - fever or chills, cough, sore throat, pain or difficulty passing urine  signs and symptoms of liver injury like dark yellow or brown  urine; general ill feeling or flu-like symptoms; light-colored stools; loss of appetite; right upper belly pain; unusually weak or tired; yellowing of the eyes or skin Side effects that usually do not require medical attention (report to your doctor or health care professional if they continue or are bothersome):  back pain  constipation  loss of appetite  diarrhea  joint pain  muscle cramps  pain, tingling, numbness in the hands or feet  swelling of the ankles, feet, hands  tiredness  trouble sleeping This list may not describe all possible side effects. Call your doctor for medical advice about side effects. You may report side effects to FDA at 1-800-FDA-1088. Where should I keep my medicine? Keep out of the reach of children. This drug is given in a hospital or clinic and will not be stored at home. NOTE: This sheet is a summary. It may not cover all possible information. If you have questions about this medicine, talk to your doctor, pharmacist, or health care provider.  2020 Elsevier/Gold Standard (2017-11-01 14:00:48)

## 2018-10-03 NOTE — Patient Instructions (Signed)

## 2018-10-03 NOTE — Progress Notes (Signed)
Hematology and Oncology Follow Up Visit  Emily Greer [id number] 11/30/1950 68 y.o. 10/03/2018   Principle Diagnosis:  Recurrent lambda light chain myeloma History of recurrent endometrial carcinoma Iron deficiency anemia-blood loss  PastTherapy: Status post second autologous stem cell transplant on 07/24/2014 Maintenance therapy with Pomalidomide/every 2 week Velcade - d/c'ed Xgeva 120 mg subcutaneous every 3months -next dose in October 2020  Radiation therapy for endometrial recurrence - completed 04/20/2015 Pomalyst/Kyprolis 70mg /m2 IV q 2 weeks - s/p cycle #12 - held on 12/26/2017 for hematuria  Current Therapy:   Daratumumab/Pomalyst (1 mg) - status post cycle 7 Femara 2.5 mg po q day IV iron as indicated   Interim History:  Emily Greer is here today for follow-up and treatment. She is doing well but has been having hot flashes. She will follow-up with her gynecologist if these persist or worsen.  In July, M-spike was 0.2 and lambda light chains were 5.87 mg/dL.  No fever, chills, n/v, cough, rash, dizziness, SOB, chest pain, palpitations, abdominal pain or changes in bowel or bladder habits.  No episodes of bleeding, no bruising or petechiae.  No swelling, tenderness, numbness or tingling in her extremities at this time. She will occasionally have puffiness in her hands and feet and avoids eating foods high in sodium. She takes Maxzide daily as prescribed  She has maintained a good appetite and is staying well hydrated. Her weight is stable.  She will sometimes take a walk for exercise.  She is staying busy taking care of her mother who is living with her right now.   ECOG Performance Status: 1 - Symptomatic but completely ambulatory  Medications:  Allergies as of 10/03/2018      Reactions   Codeine Nausea Only      Medication List       Accurate as of October 03, 2018  8:59 AM. If you have any questions, ask your nurse or doctor.         acyclovir 400 MG tablet Commonly known as: ZOVIRAX Take 1 tablet (400 mg total) by mouth 2 (two) times daily.   albuterol 108 (90 Base) MCG/ACT inhaler Commonly known as: VENTOLIN HFA Inhale 2 puffs into the lungs every 6 (six) hours as needed for wheezing or shortness of breath. 2 puffs 3 times daily x 5 days then every 6 hours as needed.   amLODipine 10 MG tablet Commonly known as: NORVASC TAKE 1 TABLET BY MOUTH EVERY MORNING   aspirin EC 81 MG tablet Take 81 mg by mouth 2 (two) times daily. Stopped  By dr Myna Hidalgo until urology procedures complete   CALCIUM 1200+D3 PO Take 1 tablet by mouth daily.   letrozole 2.5 MG tablet Commonly known as: FEMARA Take 1 tablet (2.5 mg total) by mouth daily.   loperamide 2 MG capsule Commonly known as: IMODIUM Take by mouth as needed for diarrhea or loose stools. Reported on 08/19/2015   loratadine 10 MG tablet Commonly known as: CLARITIN Take 10 mg by mouth every morning. Reported on 08/19/2015   LORazepam 0.5 MG tablet Commonly known as: Ativan Take 1 tablet (0.5 mg total) by mouth every 6 (six) hours as needed (Nausea or vomiting).   montelukast 10 MG tablet Commonly known as: SINGULAIR TAKE 1 TABLET(10 MG) BY MOUTH AT BEDTIME   Nasacort Allergy 24HR 55 MCG/ACT Aero nasal inhaler Generic drug: triamcinolone Place 2 sprays into the nose as needed.   pomalidomide 1 MG capsule Commonly known as: Pomalyst TAKE 1 CAPSULE BY MOUTH ONCE  DAILY FOR 21 DAYS ON AND 7 DAYS OFF-AUTH #[id number]   potassium chloride SA 20 MEQ tablet Commonly known as: K-DUR TAKE 1 TABLET(20 MEQ) BY MOUTH TWICE DAILY   traMADol 50 MG tablet Commonly known as: Ultram Take 1 tablet (50 mg total) by mouth every 6 (six) hours as needed.   triamterene-hydrochlorothiazide 37.5-25 MG tablet Commonly known as: MAXZIDE-25 1 tablet by mouth at bedtime   Vitamin D3 50 MCG (2000 UT) Tabs Take 2 tablets by mouth daily.       Allergies:  Allergies  Allergen  Reactions  . Codeine Nausea Only    Past Medical History, Surgical history, Social history, and Family History were reviewed and updated.  Review of Systems: All other 10 point review of systems is negative.   Physical Exam:  vitals were not taken for this visit.   Wt Readings from Last 3 Encounters:  09/18/18 195 lb 3.2 oz (88.5 kg)  09/18/18 195 lb 3.2 oz (88.5 kg)  08/29/18 198 lb 4 oz (89.9 kg)    Ocular: Sclerae unicteric, pupils equal, round and reactive to light Ear-nose-throat: Oropharynx clear, dentition fair Lymphatic: No cervical or supraclavicular adenopathy Lungs no rales or rhonchi, good excursion bilaterally Heart regular rate and rhythm, no murmur appreciated Abd soft, nontender, positive bowel sounds, no liver or spleen tip palpated on exam, no fluid wave  MSK no focal spinal tenderness, no joint edema Neuro: non-focal, well-oriented, appropriate affect Breasts: Deferred   Lab Results  Component Value Date   WBC 4.2 10/03/2018   HGB 11.4 (L) 10/03/2018   HCT 35.1 (L) 10/03/2018   MCV 82.8 10/03/2018   PLT 219 10/03/2018   Lab Results  Component Value Date   FERRITIN 438 (H) 08/01/2018   IRON 60 08/01/2018   TIBC 363 08/01/2018   UIBC 303 08/01/2018   IRONPCTSAT 16 (L) 08/01/2018   Lab Results  Component Value Date   RETICCTPCT 2.9 (H) 10/17/2017   RBC 4.24 10/03/2018   Lab Results  Component Value Date   KPAFRELGTCHN 5.5 08/29/2018   LAMBDASER 58.7 (H) 08/29/2018   KAPLAMBRATIO 0.09 (L) 08/29/2018   Lab Results  Component Value Date   IGGSERUM 374 (L) 08/29/2018   IGA 16 (L) 08/29/2018   IGMSERUM <5 (L) 08/29/2018   Lab Results  Component Value Date   TOTALPROTELP 5.9 (L) 08/29/2018   ALBUMINELP 4.0 08/29/2018   A1GS 0.1 08/29/2018   A2GS 0.7 08/29/2018   BETS 0.8 08/29/2018   BETA2SER 0.4 11/23/2014   GAMS 0.2 (L) 08/29/2018   MSPIKE 0.2 (H) 08/29/2018   SPEI Comment 06/06/2018     Chemistry      Component Value Date/Time    NA 141 08/29/2018 0902   NA 141 01/10/2017 1115   NA 140 06/21/2016 0918   K 3.6 08/29/2018 0902   K 4.0 01/10/2017 1115   K 4.3 06/21/2016 0918   CL 106 08/29/2018 0902   CL 106 01/10/2017 1115   CO2 25 08/29/2018 0902   CO2 27 01/10/2017 1115   CO2 20 (L) 06/21/2016 0918   BUN 15 08/29/2018 0902   BUN 15 01/10/2017 1115   BUN 15.8 06/21/2016 0918   CREATININE 0.83 08/29/2018 0902   CREATININE 1.0 01/10/2017 1115   CREATININE 0.8 06/21/2016 0918      Component Value Date/Time   CALCIUM 9.1 08/29/2018 0902   CALCIUM 9.5 01/10/2017 1115   CALCIUM 9.4 06/21/2016 0918   ALKPHOS 34 (L) 08/29/2018 0902   ALKPHOS 40  01/10/2017 1115   ALKPHOS 66 06/21/2016 0918   AST 13 (L) 08/29/2018 0902   AST 17 06/21/2016 0918   ALT 15 08/29/2018 0902   ALT 19 01/10/2017 1115   ALT 37 06/21/2016 0918   BILITOT 0.3 08/29/2018 0902   BILITOT 0.32 06/21/2016 0918       Impression and Plan: Ms. Schink is a very pleasant 68 yo African American female with recurrent lambda light chain myeloma. Her second a stem cell transplant for light chain myeloma was in June 2016. She also has localized recurrent endometrial cancer (followed by gyn onc Dr. Nelly Rout).  We will proceed with treatment today as planned.  We will see her back in another 4 weeks.  She will contact our office with any questions or concerns. We can certainly see her sooner if needed.   Emeline Gins, NP 9/3/20208:59 AM

## 2018-10-04 LAB — IGG, IGA, IGM
IgA: 17 mg/dL — ABNORMAL LOW (ref 87–352)
IgG (Immunoglobin G), Serum: 391 mg/dL — ABNORMAL LOW (ref 586–1602)
IgM (Immunoglobulin M), Srm: <5 mg/dL — ABNORMAL LOW (ref 26–217)

## 2018-10-04 LAB — KAPPA/LAMBDA LIGHT CHAINS
Kappa free light chain: 4.2 mg/L (ref 3.3–19.4)
Kappa, lambda light chain ratio: 0.06 — ABNORMAL LOW (ref 0.26–1.65)
Lambda free light chains: 75.2 mg/L — ABNORMAL HIGH (ref 5.7–26.3)

## 2018-10-08 LAB — PROTEIN ELECTROPHORESIS, SERUM, WITH REFLEX
A/G Ratio: 1.9 — ABNORMAL HIGH (ref 0.7–1.7)
Albumin ELP: 4 g/dL (ref 2.9–4.4)
Alpha-1-Globulin: 0.2 g/dL (ref 0.0–0.4)
Alpha-2-Globulin: 0.8 g/dL (ref 0.4–1.0)
Beta Globulin: 0.9 g/dL (ref 0.7–1.3)
Gamma Globulin: 0.3 g/dL — ABNORMAL LOW (ref 0.4–1.8)
Globulin, Total: 2.1 g/dL — ABNORMAL LOW (ref 2.2–3.9)
M-Spike, %: 0.1 g/dL — ABNORMAL HIGH
SPEP Interpretation: 0
Total Protein ELP: 6.1 g/dL (ref 6.0–8.5)

## 2018-10-08 LAB — IMMUNOFIXATION REFLEX, SERUM
IgA: 19 mg/dL — ABNORMAL LOW (ref 87–352)
IgG (Immunoglobin G), Serum: 442 mg/dL — ABNORMAL LOW (ref 586–1602)
IgM (Immunoglobulin M), Srm: <5 mg/dL — ABNORMAL LOW (ref 26–217)

## 2018-10-09 NOTE — Progress Notes (Signed)
Office Visit:  GYN ONCOLOGY  CC:   Recurrent endometrial cancer , VAIN I   Assessment:   68 y.o.  year old with Stage1A Grade 2 endometrioid endometrial cancer staged 10/2011 with recurrence at the distal vagina identified in April 2014.  Subsequent PET scan was negative for evidence of metastatic disease and Emily Gardella WoodwardCompleted vaginal  brachytherapy 07/29/2012.  Second vaginal recurrence (apex) diagnosed December 2016. Imaging without evidence of  metastatic disease.    Options discussed at multidisciplinary tumor conference and she was treated with conformational radiotherapy   03/10/2015 - 04/20/2015 NED Continue Femara.  Refills ordered.  Abnormal Pap hrHPV + VAIN I Has not been evaluated in over 2 years Pap collectedtoday  Rectal bleeding S/P colonoscopy 6 months ago Emily Greer positive testing Declined genetic counseling Consider MSI testing of tumor for consideration of  PDL1 checkpoint inhibitor if there is recurrence   HPI:  Emily Greer is a 68 y.o. year old G3P2 initially seen in consultation on 10/05/2011  grade 1  endometrial cancer  She then underwent a  total abdominal hysterectomy bilateral salpingo-oophorectomy bilateral pelvic lymph node dissection on  27/03/5007 without complications.  Her postoperative course was  uncomplicated.  Her final pathologic diagnosis is a Stage  1A Grade  2 endometrioid endometrial cancer with  negative lymphovascular space invasion,  2/20 (10%) of myometrial invasion and negative lymph nodes.  On 01/2012 visit she reported  post coital vaginal bleeding  that was self limiting. Vaginal biopsy - LARGELY DENUDED SQUAMOUS EPITHELIUM WITH ASSOCIATED SPONGIOSIS AND CHRONIC INFLAMMATION.- NO DYSPLASIA OR MALIGNANCY  On the visit 04/2012 she c/o vaginal bleeding.  Biopsies were c/w metastatic disease at the distal vagina  PET 06/05/2012 IMPRESSION:  1. There are no specific features identified to suggest  hypermetabolic metastasis from endometrial  carcinoma.  2. Skeletal changes of multiple myeloma with focal area of increased uptake   Treated with vaginal brachytherapy June 4, June 11, June 19, June 25, June 30/2014 Site/dose: Vagina, 30.5 Gy in 5 fractions (6 Gy, 6 Gy, 6 Gy, 6 Gy, 6.5 Gy)  Pap 08/17/2013 ASCUS HPV neg  She was seen by Emily Greer on 01/21/15 at which time a friable lesion was noted at the vaginal cuff. This was biopsied and was consistent with endometrioid adenocarcinoma (recurrence).  CT C/A/P 02/15/2015 without evidence of metastatic disease.  Care complicated by successful transplant 2016 for Rx multiple myeloma. Received conformational radiotherapy completed 03/2015 and then started on femara  CT C/A/P 07/26/2015 - PET declined by insurance- 1. Stable examinations demonstrating no evidence of metastatic endometrial carcinoma. 2. Stable widespread osseous findings attributed to multiple myeloma. No evidence of pathologic fracture or epidural tumor. 3. Grossly stable small left thyroid nodule. 4. Chronic bilateral femoral head avascular necrosis.  Interval history: Pap collected by Emily. Leo Greer in October 2017 ASCUS positive for high risk HPV virus.   Colposcopy 12/30/2015  c/w  Vagina, biopsy, vaginal apex - LOW GRADE SQUAMOUS INTRAEPITHELIAL LESION, CIN-I (MILD DYSPLASIA). - NO MALIGNANCY IDENTIFIED.  Pap 10/2015 ASCUS HPV + Colposcopy with bx vaginal apex 12/2015 c/w VAIN I S/P Co2 laser of the vagina 02/22/2016 Pap 06/2016 ASCUS HPV + Colposcopy with bx 07/2016 c/w  VAIN 1  Pap 07/2016 low-grade SIL  Interval history: Denies abdominal pain weight loss.  Reports stable postcoital bleeding states that she has continued Femara.  Past Medical History:  Diagnosis Date  . Avascular necrosis of femoral head ([place])    bilateral per CT 07-26-2015  . Endometrial  carcinoma [facility]) gyn oncologist-  Emily Greer ([facility])/  radiation oncologist-- Emily Greer   2013 dx  FIGO Stage 1A, Grade 2 endometrioid  endometrial cancer s/p TAH w/ BSO and bilateral pelvic node dissection 10-31-2011 ;  recurrence at distal vagina 04/ 2014 s/p  brachytherapy (ended 07-29-2012);  2nd recurrence 12/ 2016  vaginal apex s/p  conformational radiotherapy 03-10-2015 to 04-20-2015  . Family history of adverse reaction to anesthesia    mother ponv  . GERD (gastroesophageal reflux disease)   . H/O stem cell transplant ([place])    02/ 2000 and second one 06/ 2016  . 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 History of chemotherapy    last chemo 12-26-17  . History of radiation therapy 6/4, 6/11, 6/19, 6/25, 07/29/2012   vagina 30.5 gray in 5 fx, HDR brachytherapy:   last radiation to vagina 03-10-2015 to 04-20-2015  50.4gray  . History of radiation therapy 03/10/15-04/20/15   vagina 50.4 in 28 fractions  . Hypertension   . Lambda light chain myeloma [facility] oncologist-  Emily Greer ([facility])  and  [facility] -- Emily Emily Greer   dx 07/ 1999 s/p  VAD chemotherapy 11/ 1999,  purged autotransplant 03-21-1998 followed by auto stem cell transplant 03-29-1998;  recurrance w/ second autologous stem cell transplant 07-24-2014;  in Re-mission currently , chemo maintenance therapy  . Osteoporosis 12/18/05   Increased  risk   . PONV (postoperative nausea and vomiting)   . Wears glasses      Past Surgical History:  Procedure Laterality Date  . ABDOMINAL HYSTERECTOMY  10/31/2011   Procedure: HYSTERECTOMY ABDOMINAL;  Surgeon: Emily Morning, MD PHD;  Location: [facility];  Service: Gynecology;  Laterality: N/A;  . CO2 LASER APPLICATION N/A 2/70/6237   Procedure: CO2 LASER APPLICATION;  Surgeon: Emily Morning, MD;  Location: [facility];  Service: Gynecology;  Laterality: N/A;  . CYSTOSCOPY WITH RETROGRADE PYELOGRAM, URETEROSCOPY AND STENT PLACEMENT Left 02/04/2018   Procedure: CYSTOSCOPY WITH RETROGRADE PYELOGRAM, URETEROSCOPY , bladder biopsy and fulgeration;  Surgeon:  Emily Gustin, MD;  Location: [facility];  Service: Urology;  Laterality: Left;  . [place]  . HYSTEROSCOPY W/D&C  09/27/2011   Procedure: DILATATION AND CURETTAGE /HYSTEROSCOPY;  Surgeon: Eldred Manges, MD;  Location: [facility];  Service: Gynecology;;  . IR FLUORO GUIDE PORT INSERTION RIGHT  09/08/2016  . IR US GUIDE VASC ACCESS RIGHT  09/08/2016  . LAPAROTOMY  10/31/2011   Procedure: EXPLORATORY LAPAROTOMY;  Surgeon: Emily Morning, MD PHD;  Location: [facility];  Service: Gynecology;  Laterality: N/A;  . SALPINGOOPHORECTOMY  10/31/2011   Procedure: SALPINGO OOPHERECTOMY;  Surgeon: Emily Morning, MD PHD;  Location: [facility];  Service: Gynecology;  Laterality: Bilateral;   Lymph Nodes sampling  . 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  The patient's father died at 36 years, her mother is 67 years old Rreview of systems: Constitutional:  She has no fever or chills. No changes in weight.  Cardiovascular: No chest pain, palpitations or edema. Respiratory:  No shortness of breath, wheezing or cough Gastrointestinal: She has normal bowel movements without diarrhea or constipation. She denies any nausea or vomiting.  Denies rectal bleeding Genitourinary:  She denies pelvic pain, pelvic pressure or changes in her urinary function. + vaginal bleeding (light) with intercourse -  unchanged Otherwise uninformative 10 point review of system   Physical Exam: Blood pressure 135/78, pulse 94, temperature 97.8 F (36.6 C), resp. rate 20, height _0  (1.626 m), weight 195 lb 12.8 oz (88.8 kg). Wt Readings from Last 3 Encounters:  10/10/18 195 lb 12.8 oz (88.8 kg)  09/18/18 195 lb 3.2 oz (88.5 kg)  09/18/18 195 lb 3.2 oz (88.5 kg)   General: Well dressed, well nourished in no apparent distress.   Abdomen:  Soft, nontender, nondistended.  No palpable masses.  No  hepatosplenomegaly.  No ascites.   No hernias.   Genitourinary: Normal EGBUS radiation changes at the vaginal apex.  Vagina foreshortened to 3 cm and narrowed.     Radiation changes present .  No masses no bleeding.  Pap collected Chest:  CTA LN:  No cervical supraclavicular or inguinal adenopathy Rectal: Deferred back: No CVA tenderness  Back: No CVA tenderness

## 2018-10-10 ENCOUNTER — Other Ambulatory Visit (HOSPITAL_COMMUNITY)
Admission: RE | Admit: 2018-10-10 | Discharge: 2018-10-10 | Disposition: A | Discharge status: Home / Self Care | Payer: Medicare Other | Source: Ambulatory Visit | Attending: Gynecologic Oncology | Admitting: Gynecologic Oncology

## 2018-10-10 ENCOUNTER — Other Ambulatory Visit: Payer: Self-pay

## 2018-10-10 ENCOUNTER — Inpatient Hospital Stay (HOSPITAL_BASED_OUTPATIENT_CLINIC_OR_DEPARTMENT_OTHER): Payer: Medicare Other | Admitting: Gynecologic Oncology

## 2018-10-10 VITALS — BP 135/78 | HR 94 | Temp 97.8°F | Resp 20 | Ht 64.0 in | Wt 195.8 lb

## 2018-10-10 DIAGNOSIS — Z923 Personal history of irradiation: Secondary | ICD-10-CM

## 2018-10-10 DIAGNOSIS — R87620 Atypical squamous cells of undetermined significance on cytologic smear of vagina (ASC-US): Secondary | ICD-10-CM | POA: Diagnosis not present

## 2018-10-10 DIAGNOSIS — Z90710 Acquired absence of both cervix and uterus: Secondary | ICD-10-CM | POA: Diagnosis not present

## 2018-10-10 DIAGNOSIS — R87629 Unspecified abnormal cytological findings in specimens from vagina: Secondary | ICD-10-CM | POA: Diagnosis present

## 2018-10-10 DIAGNOSIS — Z90722 Acquired absence of ovaries, bilateral: Secondary | ICD-10-CM | POA: Diagnosis not present

## 2018-10-10 DIAGNOSIS — C9000 Multiple myeloma not having achieved remission: Secondary | ICD-10-CM | POA: Diagnosis not present

## 2018-10-10 DIAGNOSIS — Z8542 Personal history of malignant neoplasm of other parts of uterus: Secondary | ICD-10-CM | POA: Insufficient documentation

## 2018-10-10 DIAGNOSIS — Z1151 Encounter for screening for human papillomavirus (HPV): Secondary | ICD-10-CM | POA: Insufficient documentation

## 2018-10-10 DIAGNOSIS — C541 Malignant neoplasm of endometrium: Secondary | ICD-10-CM

## 2018-10-10 DIAGNOSIS — N890 Mild vaginal dysplasia: Secondary | ICD-10-CM | POA: Insufficient documentation

## 2018-10-10 DIAGNOSIS — Z5112 Encounter for antineoplastic immunotherapy: Secondary | ICD-10-CM | POA: Diagnosis not present

## 2018-10-10 DIAGNOSIS — Z79811 Long term (current) use of aromatase inhibitors: Secondary | ICD-10-CM | POA: Diagnosis not present

## 2018-10-10 NOTE — Patient Instructions (Addendum)
Follow-up at [facility] in 6 months.  Our office will call you with that appointment.  Congratulations on the grandsons!!  Thank you very much [name] for allowing me to provide care for you today.  I appreciate your confidence in choosing our Gynecologic Oncology team.  If you have any questions about your visit today please call our office and we will get back to you as soon as possible.  Please consider using the website Medlineplus.gov as an Geneticist, molecular.   Francetta Found. Maurizio Geno MD., PhD Gynecologic Oncology

## 2018-10-15 ENCOUNTER — Other Ambulatory Visit: Payer: Self-pay | Admitting: Hematology & Oncology

## 2018-10-15 DIAGNOSIS — E876 Hypokalemia: Secondary | ICD-10-CM

## 2018-10-15 LAB — CYTOLOGY - PAP
Diagnosis: UNDETERMINED — AB
HPV: NOT DETECTED

## 2018-10-16 ENCOUNTER — Telehealth: Payer: Self-pay | Admitting: Internal Medicine

## 2018-10-16 NOTE — Chronic Care Management (AMB) (Signed)
Chronic Care Management   Note  10/16/2018 Name: Emily Gre[facility] MRN: [id number] DOB: [birth year]  Emily Greer is a 68 y.o. year old female who is a primary care patient of [name], MD. I reached out to Myrtie Cruise by phone today in response to a referral sent by Emily Greer's health plan.    Emily Greer was given information about Chronic Care Management services today including:  1. CCM service includes personalized support from designated clinical staff supervised by her physician, including individualized plan of care and coordination with other care providers 2. 24/7 contact phone numbers for assistance for urgent and routine care needs. 3. Service will only be billed when office clinical staff spend 20 minutes or more in a month to coordinate care. 4. Only one practitioner may furnish and bill the service in a calendar month. 5. The patient may stop CCM services at any time (effective at the end of the month) by phone call to the office staff. 6. The patient will be responsible for cost sharing (co-pay) of up to 20% of the service fee (after annual deductible is met).  Patient agreed to services and verbal consent obtained.   Follow up plan: Telephone appointment with CCM team member scheduled for: 11/19/2018  [facility] Care Guide  [facility] [place]   Connected Care  [email or web address]   ??[phone]

## 2018-10-17 ENCOUNTER — Telehealth: Payer: Self-pay | Admitting: *Deleted

## 2018-10-17 NOTE — Telephone Encounter (Signed)
Called gave the patient the appt appt for Dr. Skeet Latch at [facility]

## 2018-10-18 ENCOUNTER — Telehealth: Payer: Self-pay

## 2018-10-18 NOTE — Telephone Encounter (Signed)
I let Emily Greer know that her pap smear showed atypical cells from radiation therapy and that HPV was negative. I reminded her to follow up with Dr. Illene Labrador at [facility] in 6 months.  She verbalized understanding.

## 2018-10-24 ENCOUNTER — Ambulatory Visit: Payer: BC Managed Care – PPO

## 2018-10-24 ENCOUNTER — Other Ambulatory Visit: Payer: BC Managed Care – PPO

## 2018-10-24 ENCOUNTER — Encounter: Payer: Self-pay | Admitting: Gynecologic Oncology

## 2018-10-24 ENCOUNTER — Ambulatory Visit: Payer: BC Managed Care – PPO | Admitting: Hematology & Oncology

## 2018-10-31 ENCOUNTER — Inpatient Hospital Stay: Payer: Medicare Other

## 2018-10-31 ENCOUNTER — Inpatient Hospital Stay (HOSPITAL_BASED_OUTPATIENT_CLINIC_OR_DEPARTMENT_OTHER): Payer: Medicare Other | Admitting: Family

## 2018-10-31 ENCOUNTER — Encounter: Payer: Self-pay | Admitting: Family

## 2018-10-31 ENCOUNTER — Other Ambulatory Visit: Payer: Self-pay

## 2018-10-31 ENCOUNTER — Inpatient Hospital Stay: Payer: Medicare Other | Attending: Hematology & Oncology

## 2018-10-31 VITALS — BP 134/76 | HR 78 | Temp 97.1°F | Resp 18 | Wt 193.0 lb

## 2018-10-31 DIAGNOSIS — Z885 Allergy status to narcotic agent status: Secondary | ICD-10-CM | POA: Diagnosis not present

## 2018-10-31 DIAGNOSIS — D500 Iron deficiency anemia secondary to blood loss (chronic): Secondary | ICD-10-CM | POA: Insufficient documentation

## 2018-10-31 DIAGNOSIS — C541 Malignant neoplasm of endometrium: Secondary | ICD-10-CM | POA: Insufficient documentation

## 2018-10-31 DIAGNOSIS — Z5112 Encounter for antineoplastic immunotherapy: Secondary | ICD-10-CM | POA: Insufficient documentation

## 2018-10-31 DIAGNOSIS — C9000 Multiple myeloma not having achieved remission: Secondary | ICD-10-CM

## 2018-10-31 DIAGNOSIS — Z79899 Other long term (current) drug therapy: Secondary | ICD-10-CM | POA: Insufficient documentation

## 2018-10-31 DIAGNOSIS — C9001 Multiple myeloma in remission: Secondary | ICD-10-CM

## 2018-10-31 LAB — IRON AND TIBC
Iron: 67 ug/dL (ref 41–142)
Saturation Ratios: 19 % — ABNORMAL LOW (ref 21–57)
TIBC: 351 ug/dL (ref 236–444)
UIBC: 284 ug/dL (ref 120–384)

## 2018-10-31 LAB — FERRITIN: Ferritin: 660 ng/mL — ABNORMAL HIGH (ref 11–307)

## 2018-10-31 LAB — CMP (CANCER CENTER ONLY)
ALT: 14 U/L (ref 0–44)
AST: 12 U/L — ABNORMAL LOW (ref 15–41)
Albumin: 4.7 g/dL (ref 3.5–5.0)
Alkaline Phosphatase: 32 U/L — ABNORMAL LOW (ref 38–126)
Anion gap: 10 (ref 5–15)
BUN: 20 mg/dL (ref 8–23)
CO2: 25 mmol/L (ref 22–32)
Calcium: 9.6 mg/dL (ref 8.9–10.3)
Chloride: 105 mmol/L (ref 98–111)
Creatinine: 1.03 mg/dL — ABNORMAL HIGH (ref 0.44–1.00)
GFR, Est AFR Am: >60 mL/min (ref >60)
GFR, Estimated: 57 mL/min — ABNORMAL LOW (ref >60)
Glucose, Bld: 102 mg/dL — ABNORMAL HIGH (ref 70–99)
Potassium: 3.7 mmol/L (ref 3.5–5.1)
Sodium: 140 mmol/L (ref 135–145)
Total Bilirubin: 0.4 mg/dL (ref 0.3–1.2)
Total Protein: 6.6 g/dL (ref 6.5–8.1)

## 2018-10-31 LAB — CBC WITH DIFFERENTIAL (CANCER CENTER ONLY)
Abs Immature Granulocytes: 0.01 10*3/uL (ref 0.00–0.07)
Basophils Absolute: 0.1 10*3/uL (ref 0.0–0.1)
Basophils Relative: 2 %
Eosinophils Absolute: 0.1 10*3/uL (ref 0.0–0.5)
Eosinophils Relative: 2 %
HCT: 35.3 % — ABNORMAL LOW (ref 36.0–46.0)
Hemoglobin: 11.4 g/dL — ABNORMAL LOW (ref 12.0–15.0)
Immature Granulocytes: 0 %
Lymphocytes Relative: 19 %
Lymphs Abs: 0.8 10*3/uL (ref 0.7–4.0)
MCH: 27 pg (ref 26.0–34.0)
MCHC: 32.3 g/dL (ref 30.0–36.0)
MCV: 83.6 fL (ref 80.0–100.0)
Monocytes Absolute: 0.5 10*3/uL (ref 0.1–1.0)
Monocytes Relative: 14 %
Neutro Abs: 2.4 10*3/uL (ref 1.7–7.7)
Neutrophils Relative %: 63 %
Platelet Count: 238 10*3/uL (ref 150–400)
RBC: 4.22 MIL/uL (ref 3.87–5.11)
RDW: 17.1 % — ABNORMAL HIGH (ref 11.5–15.5)
WBC Count: 3.9 10*3/uL — ABNORMAL LOW (ref 4.0–10.5)
nRBC: 0 % (ref 0.0–0.2)

## 2018-10-31 MED ORDER — DARATUMUMAB-HYALURONIDASE-FIHJ 1800-30000 MG-UT/15ML ~~LOC~~ SOLN
1800.0000 mg | Freq: Once | SUBCUTANEOUS | Status: AC
  Administered 2018-10-31: 1800 mg via SUBCUTANEOUS
  Filled 2018-10-31: qty 15

## 2018-10-31 MED ORDER — DEXAMETHASONE SODIUM PHOSPHATE 10 MG/ML IJ SOLN
INTRAMUSCULAR | Status: AC
  Filled 2018-10-31: qty 1

## 2018-10-31 MED ORDER — ACETAMINOPHEN 325 MG PO TABS
650.0000 mg | ORAL_TABLET | Freq: Once | ORAL | Status: AC
  Administered 2018-10-31: 10:00:00 650 mg via ORAL

## 2018-10-31 MED ORDER — ACETAMINOPHEN 325 MG PO TABS
ORAL_TABLET | ORAL | Status: AC
  Filled 2018-10-31: qty 2

## 2018-10-31 MED ORDER — DEXAMETHASONE 4 MG PO TABS
20.0000 mg | ORAL_TABLET | Freq: Once | ORAL | Status: AC
  Administered 2018-10-31: 11:00:00 20 mg via ORAL

## 2018-10-31 MED ORDER — DIPHENHYDRAMINE HCL 25 MG PO CAPS
ORAL_CAPSULE | ORAL | Status: AC
  Filled 2018-10-31: qty 2

## 2018-10-31 MED ORDER — DIPHENHYDRAMINE HCL 25 MG PO CAPS
50.0000 mg | ORAL_CAPSULE | Freq: Once | ORAL | Status: AC
  Administered 2018-10-31: 50 mg via ORAL

## 2018-10-31 MED ORDER — DEXAMETHASONE SODIUM PHOSPHATE 10 MG/ML IJ SOLN: 10.0000 mg | Freq: Once | INTRAMUSCULAR | Status: DC

## 2018-10-31 NOTE — Progress Notes (Signed)
Hematology and Oncology Follow Up Visit  Emily Greer [id number] 1950-09-08 68 y.o. 10/31/2018   Principle Diagnosis:  Recurrent lambda light chain myeloma History of recurrent endometrial carcinoma Iron deficiency anemia-blood loss  PastTherapy: Status post second autologous stem cell transplant on 07/24/2014 Maintenance therapy with Pomalidomide/every 2 week Velcade - d/c'ed Xgeva 120 mg subcutaneous every 3months -next dose inOctober 2020 Radiation therapy for endometrial recurrence - completed 04/20/2015 Pomalyst/Kyprolis 70mg /m2 IV q 2 weeks - s/p cycle #12 - held on 12/26/2017 for hematuria  Current Therapy:   Daratumumab/Pomalyst (1 mg) - status post cycle 8 Femara 2.5 mg po q day IV iron as indicated   Interim History:  Emily Greer is here today for follow-up and treatment. She is doing well and has no complaints at this time. In September, M-spike was 0.1 and lambda light chains 7.52 mg/dL.  She follows up with Gyn Onc at [facility] in 6 months.  No episodes of bleeding, no bruising or petechiae.  No fever, chills, n/v, cough, rash, dizziness, SOB, chest pain, palpitations, abdominal pain or changes in bowel or bladder habits.  No swelling, tenderness, numbness or tingling in her extremities at this time.  She is eating well and staying hydrated. Her weight is stable.   ECOG Performance Status: 0 - Asymptomatic  Medications:  Allergies as of 10/31/2018      Reactions   Codeine Nausea Only      Medication List       Accurate as of October 31, 2018  9:52 AM. If you have any questions, ask your nurse or doctor.        acyclovir 400 MG tablet Commonly known as: ZOVIRAX Take 1 tablet (400 mg total) by mouth 2 (two) times daily.   albuterol 108 (90 Base) MCG/ACT inhaler Commonly known as: VENTOLIN HFA Inhale 2 puffs into the lungs every 6 (six) hours as needed for wheezing or shortness of breath. 2 puffs 3 times daily x 5 days then  every 6 hours as needed.   amLODipine 10 MG tablet Commonly known as: NORVASC TAKE 1 TABLET BY MOUTH EVERY MORNING   aspirin EC 81 MG tablet Take 81 mg by mouth 2 (two) times daily. Stopped  By dr Myna Hidalgo until urology procedures complete   CALCIUM 1200+D3 PO Take 1 tablet by mouth daily.   letrozole 2.5 MG tablet Commonly known as: FEMARA Take 1 tablet (2.5 mg total) by mouth daily.   loperamide 2 MG capsule Commonly known as: IMODIUM Take by mouth as needed for diarrhea or loose stools. Reported on 08/19/2015   loratadine 10 MG tablet Commonly known as: CLARITIN Take 10 mg by mouth every morning. Reported on 08/19/2015   LORazepam 0.5 MG tablet Commonly known as: Ativan Take 1 tablet (0.5 mg total) by mouth every 6 (six) hours as needed (Nausea or vomiting).   montelukast 10 MG tablet Commonly known as: SINGULAIR TAKE 1 TABLET(10 MG) BY MOUTH AT BEDTIME   Nasacort Allergy 24HR 55 MCG/ACT Aero nasal inhaler Generic drug: triamcinolone Place 2 sprays into the nose as needed.   pomalidomide 1 MG capsule Commonly known as: Pomalyst TAKE 1 CAPSULE BY MOUTH ONCE DAILY FOR 21 DAYS ON AND 7 DAYS OFF-AUTH #[id number]   potassium chloride SA 20 MEQ tablet Commonly known as: KLOR-CON TAKE 1 TABLET(20 MEQ) BY MOUTH TWICE DAILY   traMADol 50 MG tablet Commonly known as: Ultram Take 1 tablet (50 mg total) by mouth every 6 (six) hours as needed.   triamterene-hydrochlorothiazide  37.5-25 MG tablet Commonly known as: MAXZIDE-25 1 tablet by mouth at bedtime   Vitamin D3 50 MCG (2000 UT) Tabs Take 2 tablets by mouth daily.       Allergies:  Allergies  Allergen Reactions  . Codeine Nausea Only    Past Medical History, Surgical history, Social history, and Family History were reviewed and updated.  Review of Systems: All other 10 point review of systems is negative.   Physical Exam:  weight is 193 lb (87.5 kg). Her oral temperature is 97.1 F (36.2 C) (abnormal). Her  blood pressure is 134/76 and her pulse is 78. Her respiration is 18 and oxygen saturation is 99%.   Wt Readings from Last 3 Encounters:  10/31/18 193 lb (87.5 kg)  10/10/18 195 lb 12.8 oz (88.8 kg)  09/18/18 195 lb 3.2 oz (88.5 kg)    Ocular: Sclerae unicteric, pupils equal, round and reactive to light Ear-nose-throat: Oropharynx clear, dentition fair Lymphatic: No cervical or supraclavicular adenopathy Lungs no rales or rhonchi, good excursion bilaterally Heart regular rate and rhythm, no murmur appreciated Abd soft, nontender, positive bowel sounds, no liver or spleen tip palpated on exam, no fluid wave  MSK no focal spinal tenderness, no joint edema Neuro: non-focal, well-oriented, appropriate affect Breasts: Deferred   Lab Results  Component Value Date   WBC 3.9 (L) 10/31/2018   HGB 11.4 (L) 10/31/2018   HCT 35.3 (L) 10/31/2018   MCV 83.6 10/31/2018   PLT 238 10/31/2018   Lab Results  Component Value Date   FERRITIN 705 (H) 10/03/2018   IRON 93 10/03/2018   TIBC 354 10/03/2018   UIBC 261 10/03/2018   IRONPCTSAT 26 10/03/2018   Lab Results  Component Value Date   RETICCTPCT 2.9 (H) 10/17/2017   RBC 4.22 10/31/2018   Lab Results  Component Value Date   KPAFRELGTCHN 4.2 10/03/2018   LAMBDASER 75.2 (H) 10/03/2018   KAPLAMBRATIO 0.06 (L) 10/03/2018   Lab Results  Component Value Date   IGGSERUM 391 (L) 10/03/2018   IGGSERUM 442 (L) 10/03/2018   IGA 17 (L) 10/03/2018   IGA 19 (L) 10/03/2018   IGMSERUM <5 (L) 10/03/2018   IGMSERUM <5 (L) 10/03/2018   Lab Results  Component Value Date   TOTALPROTELP 6.1 10/03/2018   ALBUMINELP 4.0 10/03/2018   A1GS 0.2 10/03/2018   A2GS 0.8 10/03/2018   BETS 0.9 10/03/2018   BETA2SER 0.4 11/23/2014   GAMS 0.3 (L) 10/03/2018   MSPIKE 0.1 (H) 10/03/2018   SPEI Comment 06/06/2018     Chemistry      Component Value Date/Time   NA 140 10/31/2018 0900   NA 141 01/10/2017 1115   NA 140 06/21/2016 0918   K 3.7 10/31/2018  0900   K 4.0 01/10/2017 1115   K 4.3 06/21/2016 0918   CL 105 10/31/2018 0900   CL 106 01/10/2017 1115   CO2 25 10/31/2018 0900   CO2 27 01/10/2017 1115   CO2 20 (L) 06/21/2016 0918   BUN 20 10/31/2018 0900   BUN 15 01/10/2017 1115   BUN 15.8 06/21/2016 0918   CREATININE 1.03 (H) 10/31/2018 0900   CREATININE 1.0 01/10/2017 1115   CREATININE 0.8 06/21/2016 0918      Component Value Date/Time   CALCIUM 9.6 10/31/2018 0900   CALCIUM 9.5 01/10/2017 1115   CALCIUM 9.4 06/21/2016 0918   ALKPHOS 32 (L) 10/31/2018 0900   ALKPHOS 40 01/10/2017 1115   ALKPHOS 66 06/21/2016 0918   AST 12 (L) 10/31/2018  0900   AST 17 06/21/2016 0918   ALT 14 10/31/2018 0900   ALT 19 01/10/2017 1115   ALT 37 06/21/2016 0918   BILITOT 0.4 10/31/2018 0900   BILITOT 0.32 06/21/2016 0918       Impression and Plan: Emily Greer is a very pleasant 68 yo African American female with recurrent lambda light chain myeloma. Her second a stem cell transplant for light chain myeloma was in June 2016. Shealsohaslocalized recurrent endometrial cancer (followed by gyn onc Dr. Nelly Rout). We will proceed with treatment today as planned and see her back in another 4 weeks.  She will contact our office with any questions or concerns. We can certainly see her sooner if needed.   Emeline Gins, NP 10/1/20209:52 AM

## 2018-10-31 NOTE — Patient Instructions (Signed)
Daratumumab injection What is this medicine? DARATUMUMAB (dar a toom ue mab) is a monoclonal antibody. It is used to treat multiple myeloma. This medicine may be used for other purposes; ask your health care provider or pharmacist if you have questions. COMMON BRAND NAME(S): DARZALEX What should I tell my health care provider before I take this medicine? They need to know if you have any of these conditions:  infection (especially a virus infection such as chickenpox, herpes, or hepatitis B virus)  lung or breathing disease  an unusual or allergic reaction to daratumumab, other medicines, foods, dyes, or preservatives  pregnant or trying to get pregnant  breast-feeding How should I use this medicine? This medicine is for infusion into a vein. It is given by a health care professional in a hospital or clinic setting. Talk to your pediatrician regarding the use of this medicine in children. Special care may be needed. Overdosage: If you think you have taken too much of this medicine contact a poison control center or emergency room at once. NOTE: This medicine is only for you. Do not share this medicine with others. What if I miss a dose? Keep appointments for follow-up doses as directed. It is important not to miss your dose. Call your doctor or health care professional if you are unable to keep an appointment. What may interact with this medicine? Interactions have not been studied. This list may not describe all possible interactions. Give your health care provider a list of all the medicines, herbs, non-prescription drugs, or dietary supplements you use. Also tell them if you smoke, drink alcohol, or use illegal drugs. Some items may interact with your medicine. What should I watch for while using this medicine? This drug may make you feel generally unwell. Report any side effects. Continue your course of treatment even though you feel ill unless your doctor tells you to stop. This  medicine can cause serious allergic reactions. To reduce your risk you may need to take medicine before treatment with this medicine. Take your medicine as directed. This medicine can affect the results of blood tests to match your blood type. These changes can last for up to 6 months after the final dose. Your healthcare provider will do blood tests to match your blood type before you start treatment. Tell all of your healthcare providers that you are being treated with this medicine before receiving a blood transfusion. This medicine can affect the results of some tests used to determine treatment response; extra tests may be needed to evaluate response. Do not become pregnant while taking this medicine or for 3 months after stopping it. Women should inform their doctor if they wish to become pregnant or think they might be pregnant. There is a potential for serious side effects to an unborn child. Talk to your health care professional or pharmacist for more information. What side effects may I notice from receiving this medicine? Side effects that you should report to your doctor or health care professional as soon as possible:  allergic reactions like skin rash, itching or hives, swelling of the face, lips, or tongue  breathing problems  chills  cough  dizziness  feeling faint or lightheaded  headache  low blood counts - this medicine may decrease the number of white blood cells, red blood cells and platelets. You may be at increased risk for infections and bleeding.  nausea, vomiting  shortness of breath  signs of decreased platelets or bleeding - bruising, pinpoint red spots on  the skin, black, tarry stools, blood in the urine  signs of decreased red blood cells - unusually weak or tired, feeling faint or lightheaded, falls  signs of infection - fever or chills, cough, sore throat, pain or difficulty passing urine  signs and symptoms of liver injury like dark yellow or brown  urine; general ill feeling or flu-like symptoms; light-colored stools; loss of appetite; right upper belly pain; unusually weak or tired; yellowing of the eyes or skin Side effects that usually do not require medical attention (report to your doctor or health care professional if they continue or are bothersome):  back pain  constipation  loss of appetite  diarrhea  joint pain  muscle cramps  pain, tingling, numbness in the hands or feet  swelling of the ankles, feet, hands  tiredness  trouble sleeping This list may not describe all possible side effects. Call your doctor for medical advice about side effects. You may report side effects to FDA at 1-800-FDA-1088. Where should I keep my medicine? Keep out of the reach of children. This drug is given in a hospital or clinic and will not be stored at home. NOTE: This sheet is a summary. It may not cover all possible information. If you have questions about this medicine, talk to your doctor, pharmacist, or health care provider.  2020 Elsevier/Gold Standard (2017-11-01 14:00:48)

## 2018-10-31 NOTE — Addendum Note (Signed)
Addended by: Burney Gauze R on: 10/31/2018 10:13 AM   Modules accepted: Orders

## 2018-10-31 NOTE — Patient Instructions (Signed)
Tunneled Central Venous Catheter Flushing Guide  It is important to flush your tunneled central venous catheter each time you use it, both before and after you use it. Flushing your catheter will help prevent it from clogging. What are the risks? Risks may include:  Infection.  Air getting into the catheter and bloodstream. Supplies needed:  A clean pair of gloves.  A disinfecting wipe. Use an alcohol wipe, chlorhexidine wipe, or iodine wipe as told by your health care provider.  A 10 mL syringe that has been prefilled with saline solution.  An empty 10 mL syringe, if a substance called heparin was injected into your catheter. How to flush your catheter When you flush your catheter, make sure you follow any specific instructions from your health care provider or the manufacturer. These are general guidelines. Flushing your catheter before use If there is heparin in your catheter: 1. Wash your hands with soap and water. 2. Put on gloves. 3. Scrub the injection cap for a minimum of 15 seconds with a disinfecting wipe. 4. Unclamp the catheter. 5. Attach the empty syringe to the injection cap. 6. Pull the syringe plunger back and withdraw 10 mL of blood. 7. Place the syringe into an appropriate waste container. 8. Scrub the injection cap for 15 seconds with a disinfecting wipe. 9. Attach the prefilled syringe to the injection cap. 10. Flush the catheter by pushing the plunger forward until all the liquid from the syringe is in the catheter. 11. Remove the syringe from the injection cap. 12. Clamp the catheter. If there is no heparin in your catheter: 1. Wash your hands with soap and water. 2. Put on gloves. 3. Scrub the injection cap for 15 seconds with a disinfecting wipe. 4. Unclamp the catheter. 5. Attach the prefilled syringe to the injection cap. 6. Flush the catheter by pushing the plunger forward until 5 mL of the liquid from the syringe is in the catheter. 7. Pull back on  the syringe until you see blood in the catheter. 8. If you have been asked to collect any blood, follow your health care provider's instructions. Otherwise, flush the catheter with the rest of the solution from the syringe. 9. Remove the syringe from the injection cap. 10. Clamp the catheter.  Flushing your catheter after use 1. Wash your hands with soap and water. 2. Put on gloves. 3. Scrub the injection cap for 15 seconds with a disinfecting wipe. 4. Unclamp the catheter. 5. Attach the prefilled syringe to the injection cap. 6. Flush the catheter by pushing the plunger forward until all of the liquid from the syringe is in the catheter. 7. Remove the syringe from the injection cap. 8. Clamp the catheter. Problems and solutions  If blood cannot be completely cleared from the injection cap, you may need to have the injection cap replaced.  If the catheter is difficult to flush, use the pulsing method. The pulsing method involves pushing only a few milliliters of solution into the catheter at a time and pausing between pushes.  If you do not see blood in the catheter when you pull back on the syringe, change your body position, such as by raising your arms above your head. Take a deep breath and cough. Then, pull back on the syringe. If you still do not see blood, flush the catheter with a small amount of solution. Then, change positions again and take a breath or cough. Pull back on the syringe again. If you still do not see   blood, finish flushing the catheter and contact your health care provider. Do not use your catheter until your health care provider says it is okay. General tips  Have someone help you flush your catheter, if possible.  Do not force fluid through your catheter.  Do not use a syringe that is larger or smaller than 10 mL. Using a smaller syringe can make the catheter burst.  Do not use your catheter without flushing it first if it has heparin in it. Contact a health  care provider if:  You cannot see any blood in the catheter when you flush it before using it.  Your catheter is difficult to flush. Get help right away if:  You cannot flush the catheter.  The catheter leaks when you flush it or when there is fluid in it.  There are cracks or breaks in the catheter. Summary  It is important to flush your tunneled central venous catheter each time you use it, both before and after you use it.  Scrub the injection cap for 15 seconds with a disinfecting wipe before and after you flush it.  When you flush your catheter, make sure you follow any specific instructions from your health care provider or the manufacturer.  Get help right away if you cannot flush the catheter. This information is not intended to replace advice given to you by your health care provider. Make sure you discuss any questions you have with your health care provider. Document Released: 01/05/2011 Document Revised: 04/03/2018 Document Reviewed: 04/03/2018 Elsevier Patient Education  2020 Elsevier Inc.  

## 2018-11-01 LAB — IGG, IGA, IGM
IgA: 19 mg/dL — ABNORMAL LOW (ref 87–352)
IgG (Immunoglobin G), Serum: 411 mg/dL — ABNORMAL LOW (ref 586–1602)
IgM (Immunoglobulin M), Srm: <5 mg/dL — ABNORMAL LOW (ref 26–217)

## 2018-11-01 LAB — KAPPA/LAMBDA LIGHT CHAINS
Kappa free light chain: 3.3 mg/L (ref 3.3–19.4)
Kappa, lambda light chain ratio: 0.05 — ABNORMAL LOW (ref 0.26–1.65)
Lambda free light chains: 71.3 mg/L — ABNORMAL HIGH (ref 5.7–26.3)

## 2018-11-05 LAB — PROTEIN ELECTROPHORESIS, SERUM
A/G Ratio: 1.7 (ref 0.7–1.7)
Albumin ELP: 3.8 g/dL (ref 2.9–4.4)
Alpha-1-Globulin: 0.2 g/dL (ref 0.0–0.4)
Alpha-2-Globulin: 0.8 g/dL (ref 0.4–1.0)
Beta Globulin: 0.9 g/dL (ref 0.7–1.3)
Gamma Globulin: 0.3 g/dL — ABNORMAL LOW (ref 0.4–1.8)
Globulin, Total: 2.3 g/dL (ref 2.2–3.9)
M-Spike, %: 0.1 g/dL — ABNORMAL HIGH
Total Protein ELP: 6.1 g/dL (ref 6.0–8.5)

## 2018-11-12 ENCOUNTER — Ambulatory Visit (INDEPENDENT_AMBULATORY_CARE_PROVIDER_SITE_OTHER): Payer: Medicare Other

## 2018-11-12 ENCOUNTER — Telehealth: Payer: Self-pay

## 2018-11-12 ENCOUNTER — Other Ambulatory Visit: Payer: Self-pay

## 2018-11-12 VITALS — BP 138/84 | HR 64 | Temp 98.9°F | Ht 64.0 in | Wt 197.6 lb

## 2018-11-12 DIAGNOSIS — Z23 Encounter for immunization: Secondary | ICD-10-CM

## 2018-11-12 NOTE — Telephone Encounter (Signed)
She should wait one month prior to getting Shingrix. She can get this at her local pharmacy. WE will discuss pneumonia vaccine at her next visit.

## 2018-11-12 NOTE — Telephone Encounter (Signed)
Here for flu shot today would like to have pneumonia and shingrix done, should she schedule to have them done soon or  Wait till next appt? Next appt in March 2021

## 2018-11-12 NOTE — Telephone Encounter (Signed)
Spoke with pt gave her Dr. Baird Cancer response to her questions. Pt said ok   Per Dr. Baird Cancer She should wait one month prior to getting Shingrix. She can get this at her local pharmacy. WE will discuss pneumonia vaccine at her next visit.

## 2018-11-12 NOTE — Progress Notes (Signed)
Here for flu shot

## 2018-11-13 ENCOUNTER — Other Ambulatory Visit: Payer: Self-pay | Admitting: *Deleted

## 2018-11-13 MED ORDER — POMALIDOMIDE 1 MG PO CAPS: ORAL_CAPSULE | ORAL | 0 refills | Status: DC

## 2018-11-18 ENCOUNTER — Telehealth: Payer: Self-pay

## 2018-11-18 NOTE — Telephone Encounter (Signed)
Pt will be out of town until the 28th can you send in another order  Rodriguez-Southworth, Dagmar Hait T, CMA        Please call pt and inform her that her bone density order is about to expire and needs to have it done.   Sunday Spillers

## 2018-11-18 NOTE — Telephone Encounter (Signed)
Spoke with pt gave her the provider message, pt stated she will not be back until after the 28th of this month, if you can put in another order   Rodriguez[place], Sunday Spillers, PA-C  Candiss Norse T, CMA        Please call pt and inform her that her bone density order is about to expire and needs to have it done.   Sunday Spillers

## 2018-11-19 ENCOUNTER — Telehealth: Payer: Medicare Other

## 2018-11-21 ENCOUNTER — Ambulatory Visit: Payer: BC Managed Care – PPO

## 2018-11-21 ENCOUNTER — Other Ambulatory Visit: Payer: Self-pay | Admitting: Internal Medicine

## 2018-11-21 ENCOUNTER — Ambulatory Visit: Payer: BC Managed Care – PPO | Admitting: Family

## 2018-11-21 ENCOUNTER — Other Ambulatory Visit: Payer: BC Managed Care – PPO

## 2018-11-21 DIAGNOSIS — E2839 Other primary ovarian failure: Secondary | ICD-10-CM

## 2018-11-21 NOTE — Telephone Encounter (Signed)
Done

## 2018-11-25 ENCOUNTER — Other Ambulatory Visit: Payer: Self-pay | Admitting: Internal Medicine

## 2018-11-25 DIAGNOSIS — C9002 Multiple myeloma in relapse: Secondary | ICD-10-CM

## 2018-11-27 ENCOUNTER — Other Ambulatory Visit: Payer: Self-pay | Admitting: Family

## 2018-11-28 ENCOUNTER — Inpatient Hospital Stay: Payer: Medicare Other

## 2018-11-28 ENCOUNTER — Encounter: Payer: Self-pay | Admitting: Hematology & Oncology

## 2018-11-28 ENCOUNTER — Other Ambulatory Visit: Payer: Self-pay

## 2018-11-28 ENCOUNTER — Inpatient Hospital Stay (HOSPITAL_BASED_OUTPATIENT_CLINIC_OR_DEPARTMENT_OTHER): Payer: Medicare Other | Admitting: Hematology & Oncology

## 2018-11-28 VITALS — BP 129/70 | HR 82 | Temp 96.9°F | Resp 17 | Wt 193.0 lb

## 2018-11-28 DIAGNOSIS — C9000 Multiple myeloma not having achieved remission: Secondary | ICD-10-CM | POA: Diagnosis not present

## 2018-11-28 DIAGNOSIS — D508 Other iron deficiency anemias: Secondary | ICD-10-CM

## 2018-11-28 DIAGNOSIS — C541 Malignant neoplasm of endometrium: Secondary | ICD-10-CM

## 2018-11-28 DIAGNOSIS — Z79899 Other long term (current) drug therapy: Secondary | ICD-10-CM | POA: Diagnosis not present

## 2018-11-28 DIAGNOSIS — D500 Iron deficiency anemia secondary to blood loss (chronic): Secondary | ICD-10-CM | POA: Diagnosis not present

## 2018-11-28 DIAGNOSIS — Z5112 Encounter for antineoplastic immunotherapy: Secondary | ICD-10-CM | POA: Diagnosis not present

## 2018-11-28 DIAGNOSIS — Z885 Allergy status to narcotic agent status: Secondary | ICD-10-CM | POA: Diagnosis not present

## 2018-11-28 DIAGNOSIS — C9001 Multiple myeloma in remission: Secondary | ICD-10-CM

## 2018-11-28 LAB — CBC WITH DIFFERENTIAL (CANCER CENTER ONLY)
Abs Immature Granulocytes: 0.01 10*3/uL (ref 0.00–0.07)
Basophils Absolute: 0.1 10*3/uL (ref 0.0–0.1)
Basophils Relative: 2 %
Eosinophils Absolute: 0.1 10*3/uL (ref 0.0–0.5)
Eosinophils Relative: 3 %
HCT: 35.5 % — ABNORMAL LOW (ref 36.0–46.0)
Hemoglobin: 11.4 g/dL — ABNORMAL LOW (ref 12.0–15.0)
Immature Granulocytes: 0 %
Lymphocytes Relative: 21 %
Lymphs Abs: 0.8 10*3/uL (ref 0.7–4.0)
MCH: 27 pg (ref 26.0–34.0)
MCHC: 32.1 g/dL (ref 30.0–36.0)
MCV: 83.9 fL (ref 80.0–100.0)
Monocytes Absolute: 0.5 10*3/uL (ref 0.1–1.0)
Monocytes Relative: 12 %
Neutro Abs: 2.3 10*3/uL (ref 1.7–7.7)
Neutrophils Relative %: 62 %
Platelet Count: 241 10*3/uL (ref 150–400)
RBC: 4.23 MIL/uL (ref 3.87–5.11)
RDW: 15.9 % — ABNORMAL HIGH (ref 11.5–15.5)
WBC Count: 3.8 10*3/uL — ABNORMAL LOW (ref 4.0–10.5)
nRBC: 0 % (ref 0.0–0.2)

## 2018-11-28 LAB — CMP (CANCER CENTER ONLY)
ALT: 13 U/L (ref 0–44)
AST: 12 U/L — ABNORMAL LOW (ref 15–41)
Albumin: 4.6 g/dL (ref 3.5–5.0)
Alkaline Phosphatase: 34 U/L — ABNORMAL LOW (ref 38–126)
Anion gap: 8 (ref 5–15)
BUN: 16 mg/dL (ref 8–23)
CO2: 26 mmol/L (ref 22–32)
Calcium: 9.7 mg/dL (ref 8.9–10.3)
Chloride: 107 mmol/L (ref 98–111)
Creatinine: 0.9 mg/dL (ref 0.44–1.00)
GFR, Est AFR Am: >60 mL/min (ref >60)
GFR, Estimated: >60 mL/min (ref >60)
Glucose, Bld: 99 mg/dL (ref 70–99)
Potassium: 4.1 mmol/L (ref 3.5–5.1)
Sodium: 141 mmol/L (ref 135–145)
Total Bilirubin: 0.4 mg/dL (ref 0.3–1.2)
Total Protein: 6.3 g/dL — ABNORMAL LOW (ref 6.5–8.1)

## 2018-11-28 LAB — IRON AND TIBC
Iron: 67 ug/dL (ref 41–142)
Saturation Ratios: 18 % — ABNORMAL LOW (ref 21–57)
TIBC: 366 ug/dL (ref 236–444)
UIBC: 299 ug/dL (ref 120–384)

## 2018-11-28 LAB — FERRITIN: Ferritin: 665 ng/mL — ABNORMAL HIGH (ref 11–307)

## 2018-11-28 MED ORDER — DEXAMETHASONE 4 MG PO TABS
20.0000 mg | ORAL_TABLET | Freq: Once | ORAL | Status: AC
  Administered 2018-11-28: 20 mg via ORAL

## 2018-11-28 MED ORDER — ACETAMINOPHEN 325 MG PO TABS
ORAL_TABLET | ORAL | Status: AC
  Filled 2018-11-28: qty 2

## 2018-11-28 MED ORDER — SODIUM CHLORIDE 0.9 % IV SOLN
Freq: Once | INTRAVENOUS | Status: AC
  Administered 2018-11-28: 10:00:00 via INTRAVENOUS
  Filled 2018-11-28: qty 250

## 2018-11-28 MED ORDER — DENOSUMAB 120 MG/1.7ML ~~LOC~~ SOLN
SUBCUTANEOUS | Status: AC
  Filled 2018-11-28: qty 1.7

## 2018-11-28 MED ORDER — DENOSUMAB 120 MG/1.7ML ~~LOC~~ SOLN
120.0000 mg | Freq: Once | SUBCUTANEOUS | Status: AC
  Administered 2018-11-28: 120 mg via SUBCUTANEOUS

## 2018-11-28 MED ORDER — DIPHENHYDRAMINE HCL 25 MG PO CAPS
ORAL_CAPSULE | ORAL | Status: AC
  Filled 2018-11-28: qty 2

## 2018-11-28 MED ORDER — SODIUM CHLORIDE 0.9 % IV SOLN
510.0000 mg | Freq: Once | INTRAVENOUS | Status: AC
  Administered 2018-11-28: 510 mg via INTRAVENOUS
  Filled 2018-11-28: qty 510

## 2018-11-28 MED ORDER — DEXAMETHASONE 4 MG PO TABS
ORAL_TABLET | ORAL | Status: AC
  Filled 2018-11-28: qty 5

## 2018-11-28 MED ORDER — HEPARIN SOD (PORK) LOCK FLUSH 100 UNIT/ML IV SOLN
500.0000 [IU] | Freq: Once | INTRAVENOUS | Status: AC | PRN
  Administered 2018-11-28: 500 [IU]
  Filled 2018-11-28: qty 5

## 2018-11-28 MED ORDER — SODIUM CHLORIDE 0.9% FLUSH
10.0000 mL | INTRAVENOUS | Status: DC | PRN
  Administered 2018-11-28: 10 mL
  Filled 2018-11-28: qty 10

## 2018-11-28 MED ORDER — DIPHENHYDRAMINE HCL 25 MG PO CAPS
50.0000 mg | ORAL_CAPSULE | Freq: Once | ORAL | Status: AC
  Administered 2018-11-28: 50 mg via ORAL

## 2018-11-28 MED ORDER — DARATUMUMAB-HYALURONIDASE-FIHJ 1800-30000 MG-UT/15ML ~~LOC~~ SOLN
1800.0000 mg | Freq: Once | SUBCUTANEOUS | Status: AC
  Administered 2018-11-28: 1800 mg via SUBCUTANEOUS
  Filled 2018-11-28: qty 15

## 2018-11-28 MED ORDER — ACETAMINOPHEN 325 MG PO TABS
650.0000 mg | ORAL_TABLET | Freq: Once | ORAL | Status: AC
  Administered 2018-11-28: 650 mg via ORAL

## 2018-11-28 NOTE — Progress Notes (Signed)
Hematology and Oncology Follow Up Visit  Emily Greer [id number] [birth year] 68 y.o. 11/28/2018   Principle Diagnosis:  Recurrent lambda light chain myeloma History of recurrent endometrial carcinoma Iron deficiency anemia-blood loss  PastTherapy: Status post second autologous stem cell transplant on 07/24/2014 Maintenance therapy with Pomalidomide/every 2 week Velcade - d/c'ed Xgeva 120 mg subcutaneous every 3months -next dose inOctober 2020 Radiation therapy for endometrial recurrence - completed 04/20/2015 Pomalyst/Kyprolis 70mg /m2 IV q 2 weeks - s/p cycle #12 - held on 12/26/2017 for hematuria  Current Therapy:   Daratumumab/Pomalyst (1 mg) - status post cycle #9  IV iron as indicated   Interim History:  Emily Greer is here today for follow-up and treatment.  She enjoys having the Darzalex being given via the subcutaneous route.  So far, everything has been holding pretty steady.  Her lambda light chain we last checked was 7.1 mg/dL.  She is no longer on Femara for the uterine cancer.  Her gynecologist is managing this.  She does have some iron deficiency.  We checked her iron levels today.  Her ferritin was 665 with a iron saturation of 18%.  She has had no bleeding.  Her appetite is okay.  There is been no problems with infections.  Her IgG level or last checked was 411 mg/dL.  We have not had to give her any IVIG.  There is been no nausea or vomiting.  She has had no obvious change in bowel or bladder habits.  She is not sure what she will do for Thanksgiving.  She really wants to see her grandchildren.  Hopefully they will be able to come down from [place].  Overall, her performance status is ECOG 1.    So far, there is been no problems with the pomalidomide.    Medications:  Allergies as of 11/28/2018      Reactions   Codeine Nausea Only      Medication List       Accurate as of November 28, 2018  9:26 AM. If you have any  questions, ask your nurse or doctor.        acyclovir 400 MG tablet Commonly known as: ZOVIRAX Take 1 tablet (400 mg total) by mouth 2 (two) times daily.   albuterol 108 (90 Base) MCG/ACT inhaler Commonly known as: VENTOLIN HFA Inhale 2 puffs into the lungs every 6 (six) hours as needed for wheezing or shortness of breath. 2 puffs 3 times daily x 5 days then every 6 hours as needed.   amLODipine 10 MG tablet Commonly known as: NORVASC TAKE 1 TABLET BY MOUTH EVERY MORNING   aspirin EC 81 MG tablet Take 81 mg by mouth 2 (two) times daily. Stopped  By dr Myna Hidalgo until urology procedures complete   CALCIUM 1200+D3 PO Take 1 tablet by mouth daily.   letrozole 2.5 MG tablet Commonly known as: FEMARA Take 1 tablet (2.5 mg total) by mouth daily.   loperamide 2 MG capsule Commonly known as: IMODIUM Take by mouth as needed for diarrhea or loose stools. Reported on 08/19/2015   loratadine 10 MG tablet Commonly known as: CLARITIN Take 10 mg by mouth every morning. Reported on 08/19/2015   LORazepam 0.5 MG tablet Commonly known as: Ativan Take 1 tablet (0.5 mg total) by mouth every 6 (six) hours as needed (Nausea or vomiting).   montelukast 10 MG tablet Commonly known as: SINGULAIR TAKE 1 TABLET(10 MG) BY MOUTH AT BEDTIME   Nasacort Allergy 24HR 55 MCG/ACT Aero nasal inhaler  Generic drug: triamcinolone Place 2 sprays into the nose as needed.   pomalidomide 1 MG capsule Commonly known as: Pomalyst TAKE 1 CAPSULE BY MOUTH ONCE DAILY FOR 21 DAYS ON AND 7 DAYS OFF-AUTH #[id number]   potassium chloride SA 20 MEQ tablet Commonly known as: KLOR-CON TAKE 1 TABLET(20 MEQ) BY MOUTH TWICE DAILY   traMADol 50 MG tablet Commonly known as: Ultram Take 1 tablet (50 mg total) by mouth every 6 (six) hours as needed.   triamterene-hydrochlorothiazide 37.5-25 MG tablet Commonly known as: MAXZIDE-25 1 tablet by mouth at bedtime   Vitamin D3 50 MCG (2000 UT) Tabs Take 2 tablets by mouth  daily.       Allergies:  Allergies  Allergen Reactions  . Codeine Nausea Only    Past Medical History, Surgical history, Social history, and Family History were reviewed and updated.  Review of Systems: Review of Systems  Constitutional: Negative.   HENT: Negative.   Eyes: Negative.   Respiratory: Negative.   Cardiovascular: Negative.   Gastrointestinal: Negative.   Genitourinary: Negative.   Musculoskeletal: Negative.   Skin: Negative.   Neurological: Negative.   Endo/Heme/Allergies: Negative.   Psychiatric/Behavioral: Negative.      Physical Exam:  weight is 193 lb (87.5 kg). Her temporal temperature is 96.9 F (36.1 C) (abnormal). Her blood pressure is 129/70 and her pulse is 82. Her respiration is 17 and oxygen saturation is 99%.   Wt Readings from Last 3 Encounters:  11/28/18 193 lb (87.5 kg)  11/12/18 197 lb 9.6 oz (89.6 kg)  10/31/18 193 lb (87.5 kg)    Physical Exam Vitals signs reviewed.  HENT:     Head: Normocephalic and atraumatic.  Eyes:     Pupils: Pupils are equal, round, and reactive to light.  Neck:     Musculoskeletal: Normal range of motion.  Cardiovascular:     Rate and Rhythm: Normal rate and regular rhythm.     Heart sounds: Normal heart sounds.  Pulmonary:     Effort: Pulmonary effort is normal.     Breath sounds: Normal breath sounds.  Abdominal:     General: Bowel sounds are normal.     Palpations: Abdomen is soft.  Musculoskeletal: Normal range of motion.        General: No tenderness or deformity.  Lymphadenopathy:     Cervical: No cervical adenopathy.  Skin:    General: Skin is warm and dry.     Findings: No erythema or rash.  Neurological:     Mental Status: She is alert and oriented to person, place, and time.  Psychiatric:        Behavior: Behavior normal.        Thought Content: Thought content normal.        Judgment: Judgment normal.      Lab Results  Component Value Date   WBC 3.8 (L) 11/28/2018   HGB 11.4  (L) 11/28/2018   HCT 35.5 (L) 11/28/2018   MCV 83.9 11/28/2018   PLT 241 11/28/2018   Lab Results  Component Value Date   FERRITIN 660 (H) 10/31/2018   IRON 67 10/31/2018   TIBC 351 10/31/2018   UIBC 284 10/31/2018   IRONPCTSAT 19 (L) 10/31/2018   Lab Results  Component Value Date   RETICCTPCT 2.9 (H) 10/17/2017   RBC 4.23 11/28/2018   Lab Results  Component Value Date   KPAFRELGTCHN 3.3 10/31/2018   LAMBDASER 71.3 (H) 10/31/2018   KAPLAMBRATIO 0.05 (L) 10/31/2018   Lab Results  Component Value Date   IGGSERUM 411 (L) 10/31/2018   IGA 19 (L) 10/31/2018   IGMSERUM <5 (L) 10/31/2018   Lab Results  Component Value Date   TOTALPROTELP 6.1 10/31/2018   ALBUMINELP 3.8 10/31/2018   A1GS 0.2 10/31/2018   A2GS 0.8 10/31/2018   BETS 0.9 10/31/2018   BETA2SER 0.4 11/23/2014   GAMS 0.3 (L) 10/31/2018   MSPIKE 0.1 (H) 10/31/2018   SPEI Comment 10/31/2018     Chemistry      Component Value Date/Time   NA 140 10/31/2018 0900   NA 141 01/10/2017 1115   NA 140 06/21/2016 0918   K 3.7 10/31/2018 0900   K 4.0 01/10/2017 1115   K 4.3 06/21/2016 0918   CL 105 10/31/2018 0900   CL 106 01/10/2017 1115   CO2 25 10/31/2018 0900   CO2 27 01/10/2017 1115   CO2 20 (L) 06/21/2016 0918   BUN 20 10/31/2018 0900   BUN 15 01/10/2017 1115   BUN 15.8 06/21/2016 0918   CREATININE 1.03 (H) 10/31/2018 0900   CREATININE 1.0 01/10/2017 1115   CREATININE 0.8 06/21/2016 0918      Component Value Date/Time   CALCIUM 9.6 10/31/2018 0900   CALCIUM 9.5 01/10/2017 1115   CALCIUM 9.4 06/21/2016 0918   ALKPHOS 32 (L) 10/31/2018 0900   ALKPHOS 40 01/10/2017 1115   ALKPHOS 66 06/21/2016 0918   AST 12 (L) 10/31/2018 0900   AST 17 06/21/2016 0918   ALT 14 10/31/2018 0900   ALT 19 01/10/2017 1115   ALT 37 06/21/2016 0918   BILITOT 0.4 10/31/2018 0900   BILITOT 0.32 06/21/2016 0918       Impression and Plan: Ms. Reburn is a very pleasant 68 yo African American female with recurrent lambda  light chain myeloma. Her second a stem cell transplant for the recurrent lambda light chain myeloma was in June 2016.   Shealsohaslocalized recurrent endometrial cancer (followed by gyn onc Dr. Nelly Rout).  We will proceed with treatment today as planned.  I will plan to give her an extra week off so that we will have her back after Thanksgiving.  I will see her problem with having the extra week off for her.  This will make life a lot easier for her.     Josph Macho, MD 10/29/20209:26 AM

## 2018-11-28 NOTE — Patient Instructions (Signed)

## 2018-11-28 NOTE — Patient Instructions (Addendum)
Daratumumab injection What is this medicine? DARATUMUMAB (dar a toom ue mab) is a monoclonal antibody. It is used to treat multiple myeloma. This medicine may be used for other purposes; ask your health care provider or pharmacist if you have questions. COMMON BRAND NAME(S): DARZALEX What should I tell my health care provider before I take this medicine? They need to know if you have any of these conditions:  infection (especially a virus infection such as chickenpox, herpes, or hepatitis B virus)  lung or breathing disease  an unusual or allergic reaction to daratumumab, other medicines, foods, dyes, or preservatives  pregnant or trying to get pregnant  breast-feeding How should I use this medicine? This medicine is for infusion into a vein. It is given by a health care professional in a hospital or clinic setting. Talk to your pediatrician regarding the use of this medicine in children. Special care may be needed. Overdosage: If you think you have taken too much of this medicine contact a poison control center or emergency room at once. NOTE: This medicine is only for you. Do not share this medicine with others. What if I miss a dose? Keep appointments for follow-up doses as directed. It is important not to miss your dose. Call your doctor or health care professional if you are unable to keep an appointment. What may interact with this medicine? Interactions have not been studied. This list may not describe all possible interactions. Give your health care provider a list of all the medicines, herbs, non-prescription drugs, or dietary supplements you use. Also tell them if you smoke, drink alcohol, or use illegal drugs. Some items may interact with your medicine. What should I watch for while using this medicine? This drug may make you feel generally unwell. Report any side effects. Continue your course of treatment even though you feel ill unless your doctor tells you to stop. This  medicine can cause serious allergic reactions. To reduce your risk you may need to take medicine before treatment with this medicine. Take your medicine as directed. This medicine can affect the results of blood tests to match your blood type. These changes can last for up to 6 months after the final dose. Your healthcare provider will do blood tests to match your blood type before you start treatment. Tell all of your healthcare providers that you are being treated with this medicine before receiving a blood transfusion. This medicine can affect the results of some tests used to determine treatment response; extra tests may be needed to evaluate response. Do not become pregnant while taking this medicine or for 3 months after stopping it. Women should inform their doctor if they wish to become pregnant or think they might be pregnant. There is a potential for serious side effects to an unborn child. Talk to your health care professional or pharmacist for more information. What side effects may I notice from receiving this medicine? Side effects that you should report to your doctor or health care professional as soon as possible:  allergic reactions like skin rash, itching or hives, swelling of the face, lips, or tongue  breathing problems  chills  cough  dizziness  feeling faint or lightheaded  headache  low blood counts - this medicine may decrease the number of white blood cells, red blood cells and platelets. You may be at increased risk for infections and bleeding.  nausea, vomiting  shortness of breath  signs of decreased platelets or bleeding - bruising, pinpoint red spots on  the skin, black, tarry stools, blood in the urine  signs of decreased red blood cells - unusually weak or tired, feeling faint or lightheaded, falls  signs of infection - fever or chills, cough, sore throat, pain or difficulty passing urine  signs and symptoms of liver injury like dark yellow or brown  urine; general ill feeling or flu-like symptoms; light-colored stools; loss of appetite; right upper belly pain; unusually weak or tired; yellowing of the eyes or skin Side effects that usually do not require medical attention (report to your doctor or health care professional if they continue or are bothersome):  back pain  constipation  loss of appetite  diarrhea  joint pain  muscle cramps  pain, tingling, numbness in the hands or feet  swelling of the ankles, feet, hands  tiredness  trouble sleeping This list may not describe all possible side effects. Call your doctor for medical advice about side effects. You may report side effects to FDA at 1-800-FDA-1088. Where should I keep my medicine? Keep out of the reach of children. This drug is given in a hospital or clinic and will not be stored at home. NOTE: This sheet is a summary. It may not cover all possible information. If you have questions about this medicine, talk to your doctor, pharmacist, or health care provider.  2020 Elsevier/Gold Standard (2017-11-01 14:00:48)  Denosumab injection What is this medicine? DENOSUMAB (den oh sue mab) slows bone breakdown. Prolia is used to treat osteoporosis in women after menopause and in men, and in people who are taking corticosteroids for 6 months or more. Delton See is used to treat a high calcium level due to cancer and to prevent bone fractures and other bone problems caused by multiple myeloma or cancer bone metastases. Delton See is also used to treat giant cell tumor of the bone. This medicine may be used for other purposes; ask your health care provider or pharmacist if you have questions. COMMON BRAND NAME(S): Prolia, XGEVA What should I tell my health care provider before I take this medicine? They need to know if you have any of these conditions:  dental disease  having surgery or tooth extraction  infection  kidney disease  low levels of calcium or Vitamin D in the  blood  malnutrition  on hemodialysis  skin conditions or sensitivity  thyroid or parathyroid disease  an unusual reaction to denosumab, other medicines, foods, dyes, or preservatives  pregnant or trying to get pregnant  breast-feeding How should I use this medicine? This medicine is for injection under the skin. It is given by a health care professional in a hospital or clinic setting. A special MedGuide will be given to you before each treatment. Be sure to read this information carefully each time. For Prolia, talk to your pediatrician regarding the use of this medicine in children. Special care may be needed. For Delton See, talk to your pediatrician regarding the use of this medicine in children. While this drug may be prescribed for children as young as 13 years for selected conditions, precautions do apply. Overdosage: If you think you have taken too much of this medicine contact a poison control center or emergency room at once. NOTE: This medicine is only for you. Do not share this medicine with others. What if I miss a dose? It is important not to miss your dose. Call your doctor or health care professional if you are unable to keep an appointment. What may interact with this medicine? Do not take this medicine  with any of the following medications:  other medicines containing denosumab This medicine may also interact with the following medications:  medicines that lower your chance of fighting infection  steroid medicines like prednisone or cortisone This list may not describe all possible interactions. Give your health care provider a list of all the medicines, herbs, non-prescription drugs, or dietary supplements you use. Also tell them if you smoke, drink alcohol, or use illegal drugs. Some items may interact with your medicine. What should I watch for while using this medicine? Visit your doctor or health care professional for regular checks on your progress. Your doctor or  health care professional may order blood tests and other tests to see how you are doing. Call your doctor or health care professional for advice if you get a fever, chills or sore throat, or other symptoms of a cold or flu. Do not treat yourself. This drug may decrease your body's ability to fight infection. Try to avoid being around people who are sick. You should make sure you get enough calcium and vitamin D while you are taking this medicine, unless your doctor tells you not to. Discuss the foods you eat and the vitamins you take with your health care professional. See your dentist regularly. Brush and floss your teeth as directed. Before you have any dental work done, tell your dentist you are receiving this medicine. Do not become pregnant while taking this medicine or for 5 months after stopping it. Talk with your doctor or health care professional about your birth control options while taking this medicine. Women should inform their doctor if they wish to become pregnant or think they might be pregnant. There is a potential for serious side effects to an unborn child. Talk to your health care professional or pharmacist for more information. What side effects may I notice from receiving this medicine? Side effects that you should report to your doctor or health care professional as soon as possible:  allergic reactions like skin rash, itching or hives, swelling of the face, lips, or tongue  bone pain  breathing problems  dizziness  jaw pain, especially after dental work  redness, blistering, peeling of the skin  signs and symptoms of infection like fever or chills; cough; sore throat; pain or trouble passing urine  signs of low calcium like fast heartbeat, muscle cramps or muscle pain; pain, tingling, numbness in the hands or feet; seizures  unusual bleeding or bruising  unusually weak or tired Side effects that usually do not require medical attention (report to your doctor or  health care professional if they continue or are bothersome):  constipation  diarrhea  headache  joint pain  loss of appetite  muscle pain  runny nose  tiredness  upset stomach This list may not describe all possible side effects. Call your doctor for medical advice about side effects. You may report side effects to FDA at 1-800-FDA-1088. Where should I keep my medicine? This medicine is only given in a clinic, doctor's office, or other health care setting and will not be stored at home. NOTE: This sheet is a summary. It may not cover all possible information. If you have questions about this medicine, talk to your doctor, pharmacist, or health care provider.  2020 Elsevier/Gold Standard (2017-05-25 16:10:44)   Ferumoxytol injection What is this medicine? FERUMOXYTOL is an iron complex. Iron is used to make healthy red blood cells, which carry oxygen and nutrients throughout the body. This medicine is used to treat iron  deficiency anemia. This medicine may be used for other purposes; ask your health care provider or pharmacist if you have questions. COMMON BRAND NAME(S): Feraheme What should I tell my health care provider before I take this medicine? They need to know if you have any of these conditions:  anemia not caused by low iron levels  high levels of iron in the blood  magnetic resonance imaging (MRI) test scheduled  an unusual or allergic reaction to iron, other medicines, foods, dyes, or preservatives  pregnant or trying to get pregnant  breast-feeding How should I use this medicine? This medicine is for injection into a vein. It is given by a health care professional in a hospital or clinic setting. Talk to your pediatrician regarding the use of this medicine in children. Special care may be needed. Overdosage: If you think you have taken too much of this medicine contact a poison control center or emergency room at once. NOTE: This medicine is only for  you. Do not share this medicine with others. What if I miss a dose? It is important not to miss your dose. Call your doctor or health care professional if you are unable to keep an appointment. What may interact with this medicine? This medicine may interact with the following medications:  other iron products This list may not describe all possible interactions. Give your health care provider a list of all the medicines, herbs, non-prescription drugs, or dietary supplements you use. Also tell them if you smoke, drink alcohol, or use illegal drugs. Some items may interact with your medicine. What should I watch for while using this medicine? Visit your doctor or healthcare professional regularly. Tell your doctor or healthcare professional if your symptoms do not start to get better or if they get worse. You may need blood work done while you are taking this medicine. You may need to follow a special diet. Talk to your doctor. Foods that contain iron include: whole grains/cereals, dried fruits, beans, or peas, leafy green vegetables, and organ meats (liver, kidney). What side effects may I notice from receiving this medicine? Side effects that you should report to your doctor or health care professional as soon as possible:  allergic reactions like skin rash, itching or hives, swelling of the face, lips, or tongue  breathing problems  changes in blood pressure  feeling faint or lightheaded, falls  fever or chills  flushing, sweating, or hot feelings  swelling of the ankles or feet Side effects that usually do not require medical attention (report to your doctor or health care professional if they continue or are bothersome):  diarrhea  headache  nausea, vomiting  stomach pain This list may not describe all possible side effects. Call your doctor for medical advice about side effects. You may report side effects to FDA at 1-800-FDA-1088. Where should I keep my medicine? This drug  is given in a hospital or clinic and will not be stored at home. NOTE: This sheet is a summary. It may not cover all possible information. If you have questions about this medicine, talk to your doctor, pharmacist, or health care provider.  2020 Elsevier/Gold Standard (2016-03-06 20:21:10)

## 2018-11-29 LAB — PROTEIN ELECTROPHORESIS, SERUM
A/G Ratio: 1.9 — ABNORMAL HIGH (ref 0.7–1.7)
Albumin ELP: 4.1 g/dL (ref 2.9–4.4)
Alpha-1-Globulin: 0.2 g/dL (ref 0.0–0.4)
Alpha-2-Globulin: 0.8 g/dL (ref 0.4–1.0)
Beta Globulin: 0.9 g/dL (ref 0.7–1.3)
Gamma Globulin: 0.3 g/dL — ABNORMAL LOW (ref 0.4–1.8)
Globulin, Total: 2.2 g/dL (ref 2.2–3.9)
M-Spike, %: 0.1 g/dL — ABNORMAL HIGH
Total Protein ELP: 6.3 g/dL (ref 6.0–8.5)

## 2018-11-29 LAB — IGG, IGA, IGM
IgA: 18 mg/dL — ABNORMAL LOW (ref 87–352)
IgG (Immunoglobin G), Serum: 377 mg/dL — ABNORMAL LOW (ref 586–1602)
IgM (Immunoglobulin M), Srm: 5 mg/dL — ABNORMAL LOW (ref 26–217)

## 2018-11-29 LAB — KAPPA/LAMBDA LIGHT CHAINS
Kappa free light chain: 3.8 mg/L (ref 3.3–19.4)
Kappa, lambda light chain ratio: 0.04 — ABNORMAL LOW (ref 0.26–1.65)
Lambda free light chains: 93.3 mg/L — ABNORMAL HIGH (ref 5.7–26.3)

## 2018-12-03 ENCOUNTER — Other Ambulatory Visit: Payer: Self-pay | Admitting: Hematology & Oncology

## 2018-12-13 ENCOUNTER — Telehealth: Payer: Self-pay

## 2018-12-16 NOTE — Progress Notes (Signed)
This encounter was created in error - please disregard.

## 2018-12-30 DIAGNOSIS — C672 Malignant neoplasm of lateral wall of bladder: Secondary | ICD-10-CM | POA: Diagnosis not present

## 2019-01-01 ENCOUNTER — Other Ambulatory Visit: Payer: Self-pay

## 2019-01-01 ENCOUNTER — Ambulatory Visit
Admission: RE | Admit: 2019-01-01 | Discharge: 2019-01-01 | Disposition: A | Discharge status: Home / Self Care | Payer: Medicare Other | Source: Ambulatory Visit | Attending: Internal Medicine | Admitting: Internal Medicine

## 2019-01-01 DIAGNOSIS — Z780 Asymptomatic menopausal state: Secondary | ICD-10-CM | POA: Diagnosis not present

## 2019-01-01 DIAGNOSIS — E2839 Other primary ovarian failure: Secondary | ICD-10-CM

## 2019-01-01 DIAGNOSIS — Z13820 Encounter for screening for osteoporosis: Secondary | ICD-10-CM | POA: Diagnosis not present

## 2019-01-02 ENCOUNTER — Other Ambulatory Visit: Payer: Self-pay | Admitting: Hematology & Oncology

## 2019-01-02 ENCOUNTER — Encounter: Payer: Self-pay | Admitting: Family

## 2019-01-02 ENCOUNTER — Inpatient Hospital Stay: Payer: Medicare Other | Attending: Hematology & Oncology | Admitting: Family

## 2019-01-02 ENCOUNTER — Inpatient Hospital Stay: Payer: Medicare Other

## 2019-01-02 ENCOUNTER — Other Ambulatory Visit: Payer: Self-pay | Admitting: *Deleted

## 2019-01-02 VITALS — BP 156/72 | HR 67 | Temp 97.8°F | Resp 19 | Ht 64.0 in | Wt 198.8 lb

## 2019-01-02 VITALS — BP 160/72 | HR 63

## 2019-01-02 DIAGNOSIS — Z885 Allergy status to narcotic agent status: Secondary | ICD-10-CM | POA: Diagnosis not present

## 2019-01-02 DIAGNOSIS — D508 Other iron deficiency anemias: Secondary | ICD-10-CM

## 2019-01-02 DIAGNOSIS — R319 Hematuria, unspecified: Secondary | ICD-10-CM | POA: Insufficient documentation

## 2019-01-02 DIAGNOSIS — R61 Generalized hyperhidrosis: Secondary | ICD-10-CM | POA: Insufficient documentation

## 2019-01-02 DIAGNOSIS — Z5112 Encounter for antineoplastic immunotherapy: Secondary | ICD-10-CM | POA: Insufficient documentation

## 2019-01-02 DIAGNOSIS — C541 Malignant neoplasm of endometrium: Secondary | ICD-10-CM

## 2019-01-02 DIAGNOSIS — C9000 Multiple myeloma not having achieved remission: Secondary | ICD-10-CM

## 2019-01-02 DIAGNOSIS — R232 Flushing: Secondary | ICD-10-CM | POA: Insufficient documentation

## 2019-01-02 DIAGNOSIS — D500 Iron deficiency anemia secondary to blood loss (chronic): Secondary | ICD-10-CM | POA: Diagnosis not present

## 2019-01-02 DIAGNOSIS — C9001 Multiple myeloma in remission: Secondary | ICD-10-CM

## 2019-01-02 DIAGNOSIS — Z79899 Other long term (current) drug therapy: Secondary | ICD-10-CM | POA: Insufficient documentation

## 2019-01-02 DIAGNOSIS — R140 Abdominal distension (gaseous): Secondary | ICD-10-CM | POA: Diagnosis not present

## 2019-01-02 LAB — CMP (CANCER CENTER ONLY)
ALT: 16 U/L (ref 0–44)
AST: 12 U/L — ABNORMAL LOW (ref 15–41)
Albumin: 4.7 g/dL (ref 3.5–5.0)
Alkaline Phosphatase: 37 U/L — ABNORMAL LOW (ref 38–126)
Anion gap: 10 (ref 5–15)
BUN: 15 mg/dL (ref 8–23)
CO2: 26 mmol/L (ref 22–32)
Calcium: 9.9 mg/dL (ref 8.9–10.3)
Chloride: 105 mmol/L (ref 98–111)
Creatinine: 0.85 mg/dL (ref 0.44–1.00)
GFR, Est AFR Am: >60 mL/min
GFR, Estimated: >60 mL/min
Glucose, Bld: 97 mg/dL (ref 70–99)
Potassium: 3.5 mmol/L (ref 3.5–5.1)
Sodium: 141 mmol/L (ref 135–145)
Total Bilirubin: 0.5 mg/dL (ref 0.3–1.2)
Total Protein: 6.5 g/dL (ref 6.5–8.1)

## 2019-01-02 LAB — CBC WITH DIFFERENTIAL (CANCER CENTER ONLY)
Abs Immature Granulocytes: 0.03 10*3/uL (ref 0.00–0.07)
Basophils Absolute: 0.1 10*3/uL (ref 0.0–0.1)
Basophils Relative: 2 %
Eosinophils Absolute: 0.1 10*3/uL (ref 0.0–0.5)
Eosinophils Relative: 3 %
HCT: 32.5 % — ABNORMAL LOW (ref 36.0–46.0)
Hemoglobin: 10.7 g/dL — ABNORMAL LOW (ref 12.0–15.0)
Immature Granulocytes: 1 %
Lymphocytes Relative: 15 %
Lymphs Abs: 0.7 10*3/uL (ref 0.7–4.0)
MCH: 27.3 pg (ref 26.0–34.0)
MCHC: 32.9 g/dL (ref 30.0–36.0)
MCV: 82.9 fL (ref 80.0–100.0)
Monocytes Absolute: 0.8 10*3/uL (ref 0.1–1.0)
Monocytes Relative: 18 %
Neutro Abs: 2.9 10*3/uL (ref 1.7–7.7)
Neutrophils Relative %: 61 %
Platelet Count: 197 10*3/uL (ref 150–400)
RBC: 3.92 MIL/uL (ref 3.87–5.11)
RDW: 15.9 % — ABNORMAL HIGH (ref 11.5–15.5)
WBC Count: 4.8 10*3/uL (ref 4.0–10.5)
nRBC: 0 % (ref 0.0–0.2)

## 2019-01-02 MED ORDER — ACETAMINOPHEN 325 MG PO TABS
ORAL_TABLET | ORAL | Status: AC
  Filled 2019-01-02: qty 2

## 2019-01-02 MED ORDER — ACETAMINOPHEN 325 MG PO TABS
650.0000 mg | ORAL_TABLET | Freq: Once | ORAL | Status: AC
  Administered 2019-01-02: 650 mg via ORAL

## 2019-01-02 MED ORDER — SODIUM CHLORIDE 0.9 % IV SOLN
510.0000 mg | Freq: Once | INTRAVENOUS | Status: AC
  Administered 2019-01-02: 510 mg via INTRAVENOUS
  Filled 2019-01-02: qty 17

## 2019-01-02 MED ORDER — DIPHENHYDRAMINE HCL 25 MG PO CAPS
ORAL_CAPSULE | ORAL | Status: AC
  Filled 2019-01-02: qty 2

## 2019-01-02 MED ORDER — HEPARIN SOD (PORK) LOCK FLUSH 100 UNIT/ML IV SOLN
500.0000 [IU] | Freq: Once | INTRAVENOUS | Status: AC | PRN
  Administered 2019-01-02: 500 [IU]
  Filled 2019-01-02: qty 5

## 2019-01-02 MED ORDER — DARATUMUMAB-HYALURONIDASE-FIHJ 1800-30000 MG-UT/15ML ~~LOC~~ SOLN
1800.0000 mg | Freq: Once | SUBCUTANEOUS | Status: AC
  Administered 2019-01-02: 1800 mg via SUBCUTANEOUS
  Filled 2019-01-02: qty 15

## 2019-01-02 MED ORDER — POMALIDOMIDE 1 MG PO CAPS: ORAL_CAPSULE | ORAL | 0 refills | Status: DC

## 2019-01-02 MED ORDER — SODIUM CHLORIDE 0.9% FLUSH
10.0000 mL | INTRAVENOUS | Status: DC | PRN
  Administered 2019-01-02: 10 mL
  Filled 2019-01-02: qty 10

## 2019-01-02 MED ORDER — SODIUM CHLORIDE 0.9 % IV SOLN
INTRAVENOUS | Status: DC
  Administered 2019-01-02: 09:00:00 via INTRAVENOUS
  Filled 2019-01-02: qty 250

## 2019-01-02 MED ORDER — DEXAMETHASONE 4 MG PO TABS
20.0000 mg | ORAL_TABLET | Freq: Once | ORAL | Status: AC
  Administered 2019-01-02: 20 mg via ORAL

## 2019-01-02 MED ORDER — DEXAMETHASONE 4 MG PO TABS
ORAL_TABLET | ORAL | Status: AC
  Filled 2019-01-02: qty 5

## 2019-01-02 MED ORDER — DIPHENHYDRAMINE HCL 25 MG PO CAPS
50.0000 mg | ORAL_CAPSULE | Freq: Once | ORAL | Status: AC
  Administered 2019-01-02: 50 mg via ORAL

## 2019-01-02 NOTE — Patient Instructions (Signed)
[place] Discharge Instructions for Patients Receiving Chemotherapy  Today you received the following chemotherapy agents Darzalex To help prevent nausea and vomiting after your treatment, we encourage you to take your nausea medication as prescribed.   If you develop nausea and vomiting that is not controlled by your nausea medication, call the clinic.   BELOW ARE SYMPTOMS THAT SHOULD BE REPORTED IMMEDIATELY:  *FEVER GREATER THAN 100.5 F  *CHILLS WITH OR WITHOUT FEVER  NAUSEA AND VOMITING THAT IS NOT CONTROLLED WITH YOUR NAUSEA MEDICATION  *UNUSUAL SHORTNESS OF BREATH  *UNUSUAL BRUISING OR BLEEDING  TENDERNESS IN MOUTH AND THROAT WITH OR WITHOUT PRESENCE OF ULCERS  *URINARY PROBLEMS  *BOWEL PROBLEMS  UNUSUAL RASH Items with * indicate a potential emergency and should be followed up as soon as possible.  Feel free to call the clinic should you have any questions or concerns. The clinic phone number is [phone].  Please show the [place] at check-in to the Emergency Department and triage nurse.  Ferumoxytol injection (Feraheme) What is this medicine? FERUMOXYTOL is an iron complex. Iron is used to make healthy red blood cells, which carry oxygen and nutrients throughout the body. This medicine is used to treat iron deficiency anemia. This medicine may be used for other purposes; ask your health care provider or pharmacist if you have questions. COMMON BRAND NAME(S): Feraheme What should I tell my health care provider before I take this medicine? They need to know if you have any of these conditions:  anemia not caused by low iron levels  high levels of iron in the blood  magnetic resonance imaging (MRI) test scheduled  an unusual or allergic reaction to iron, other medicines, foods, dyes, or preservatives  pregnant or trying to get pregnant  breast-feeding How should I use this medicine? This medicine is for injection into a vein. It  is given by a health care professional in a hospital or clinic setting. Talk to your pediatrician regarding the use of this medicine in children. Special care may be needed. Overdosage: If you think you have taken too much of this medicine contact a poison control center or emergency room at once. NOTE: This medicine is only for you. Do not share this medicine with others. What if I miss a dose? It is important not to miss your dose. Call your doctor or health care professional if you are unable to keep an appointment. What may interact with this medicine? This medicine may interact with the following medications:  other iron products This list may not describe all possible interactions. Give your health care provider a list of all the medicines, herbs, non-prescription drugs, or dietary supplements you use. Also tell them if you smoke, drink alcohol, or use illegal drugs. Some items may interact with your medicine. What should I watch for while using this medicine? Visit your doctor or healthcare professional regularly. Tell your doctor or healthcare professional if your symptoms do not start to get better or if they get worse. You may need blood work done while you are taking this medicine. You may need to follow a special diet. Talk to your doctor. Foods that contain iron include: whole grains/cereals, dried fruits, beans, or peas, leafy green vegetables, and organ meats (liver, kidney). What side effects may I notice from receiving this medicine? Side effects that you should report to your doctor or health care professional as soon as possible:  allergic reactions like skin rash, itching or hives, swelling of  the face, lips, or tongue  breathing problems  changes in blood pressure  feeling faint or lightheaded, falls  fever or chills  flushing, sweating, or hot feelings  swelling of the ankles or feet Side effects that usually do not require medical attention (report to your doctor  or health care professional if they continue or are bothersome):  diarrhea  headache  nausea, vomiting  stomach pain This list may not describe all possible side effects. Call your doctor for medical advice about side effects. You may report side effects to FDA at 1-800-FDA-1088. Where should I keep my medicine? This drug is given in a hospital or clinic and will not be stored at home. NOTE: This sheet is a summary. It may not cover all possible information. If you have questions about this medicine, talk to your doctor, pharmacist, or health care provider.  2020 Elsevier/Gold Standard (2016-03-06 20:21:10)

## 2019-01-02 NOTE — Progress Notes (Signed)
Patient only needs to be monitored for 30 minutes after Darzalex injection per Dr. Marin Olp.

## 2019-01-02 NOTE — Progress Notes (Signed)
Hematology and Oncology Follow Up Visit  Emily Greer [id number] Oct 18, 1950 68 y.o. 01/02/2019   Principle Diagnosis:  Recurrent lambda light chain myeloma History of recurrent endometrial carcinoma Iron deficiency anemia-blood loss  PastTherapy: Status post second autologous stem cell transplant on 07/24/2014 Maintenance therapy with Pomalidomide/every 2 week Velcade - d/c'ed Xgeva 120 mg subcutaneous every 3months -next dose inOctober 2020 Radiation therapy for endometrial recurrence - completed 04/20/2015 Pomalyst/Kyprolis 70mg /m2 IV q 2 weeks - s/p cycle #12 - held on 12/26/2017 for hematuria  Current Therapy:   Daratumumab/Pomalyst (1 mg) - status post cycle10 Femara 2.5 mg po q day IV iron as indicated   Interim History:  Emily Greer is here today for follow-up. She is doing well and has no complaints at this time.  Iron studies at her last visit were mildly low and Hgb is down today at 10.7 from 11.4.  In October M-spike was 0.1 and lambda light chains 9.33 mg/dL. Today's results are pending.  She is doing well on Femara and states that Dr. Nelly Rout told her she would remain on this for life. She follows up with her again in March 2021.  She does have intermittent hot flashes and night sweats.  She saw her urologist last week and got a good report. She states that she now follows up with them once a year.  No fever, chills, n/v, cough, rash, dizziness, SOB, chest pain, palpitations, abdominal pain or changes in bowel or bladder habits.  She has some occasional bloating that comes and goes. No constipation.  No swelling, tenderness, numbness or tingling in her extremities.  No falls or syncope.  She is eating well and staying hydrated. Her weight is stable.  She has not noted any episodes of blood loss. No bruising or petechiae.   ECOG Performance Status: 1 - Symptomatic but completely ambulatory  Medications:  Allergies as of 01/02/2019    Reactions   Codeine Nausea Only      Medication List       Accurate as of January 02, 2019  8:58 AM. If you have any questions, ask your nurse or doctor.        acyclovir 400 MG tablet Commonly known as: ZOVIRAX Take 1 tablet (400 mg total) by mouth 2 (two) times daily.   albuterol 108 (90 Base) MCG/ACT inhaler Commonly known as: VENTOLIN HFA Inhale 2 puffs into the lungs every 6 (six) hours as needed for wheezing or shortness of breath. 2 puffs 3 times daily x 5 days then every 6 hours as needed.   amLODipine 10 MG tablet Commonly known as: NORVASC TAKE 1 TABLET BY MOUTH EVERY MORNING   aspirin EC 81 MG tablet Take 81 mg by mouth 2 (two) times daily. Stopped  By dr Myna Hidalgo until urology procedures complete   CALCIUM 1200+D3 PO Take 1 tablet by mouth daily.   letrozole 2.5 MG tablet Commonly known as: FEMARA Take 1 tablet (2.5 mg total) by mouth daily.   loperamide 2 MG capsule Commonly known as: IMODIUM Take by mouth as needed for diarrhea or loose stools. Reported on 08/19/2015   loratadine 10 MG tablet Commonly known as: CLARITIN Take 10 mg by mouth every morning. Reported on 08/19/2015   LORazepam 0.5 MG tablet Commonly known as: Ativan Take 1 tablet (0.5 mg total) by mouth every 6 (six) hours as needed (Nausea or vomiting).   montelukast 10 MG tablet Commonly known as: SINGULAIR TAKE 1 TABLET(10 MG) BY MOUTH AT BEDTIME   Nasacort  Allergy 24HR 55 MCG/ACT Aero nasal inhaler Generic drug: triamcinolone Place 2 sprays into the nose as needed.   Pomalyst 1 MG capsule Generic drug: pomalidomide TAKE 1 CAPSULE BY MOUTH ONCE DAILY FOR 21 DAYS ON AND 7 DAYS OFF What changed: See the new instructions. Changed by: Josph Macho, MD   potassium chloride SA 20 MEQ tablet Commonly known as: KLOR-CON TAKE 1 TABLET(20 MEQ) BY MOUTH TWICE DAILY   traMADol 50 MG tablet Commonly known as: Ultram Take 1 tablet (50 mg total) by mouth every 6 (six) hours as needed.    triamterene-hydrochlorothiazide 37.5-25 MG tablet Commonly known as: MAXZIDE-25 1 tablet by mouth at bedtime   Vitamin D3 50 MCG (2000 UT) Tabs Take 2 tablets by mouth daily.       Allergies:  Allergies  Allergen Reactions  . Codeine Nausea Only    Past Medical History, Surgical history, Social history, and Family History were reviewed and updated.  Review of Systems: All other 10 point review of systems is negative.   Physical Exam:  height is 5\' 4"  (1.626 m) and weight is 198 lb 12.8 oz (90.2 kg). Her temporal temperature is 97.8 F (36.6 C). Her blood pressure is 156/72 (abnormal) and her pulse is 67. Her respiration is 19 and oxygen saturation is 100%.   Wt Readings from Last 3 Encounters:  01/02/19 198 lb 12.8 oz (90.2 kg)  11/28/18 193 lb (87.5 kg)  11/12/18 197 lb 9.6 oz (89.6 kg)    Ocular: Sclerae unicteric, pupils equal, round and reactive to light Ear-nose-throat: Oropharynx clear, dentition fair Lymphatic: No cervical or supraclavicular adenopathy Lungs no rales or rhonchi, good excursion bilaterally Heart regular rate and rhythm, no murmur appreciated Abd soft, nontender, positive bowel sounds, no liver or spleen tip palpated on exam, no fluid wave  MSK no focal spinal tenderness, no joint edema Neuro: non-focal, well-oriented, appropriate affect Breasts: Deferred   Lab Results  Component Value Date   WBC 4.8 01/02/2019   HGB 10.7 (L) 01/02/2019   HCT 32.5 (L) 01/02/2019   MCV 82.9 01/02/2019   PLT 197 01/02/2019   Lab Results  Component Value Date   FERRITIN 665 (H) 11/28/2018   IRON 67 11/28/2018   TIBC 366 11/28/2018   UIBC 299 11/28/2018   IRONPCTSAT 18 (L) 11/28/2018   Lab Results  Component Value Date   RETICCTPCT 2.9 (H) 10/17/2017   RBC 3.92 01/02/2019   Lab Results  Component Value Date   KPAFRELGTCHN 3.8 11/28/2018   LAMBDASER 93.3 (H) 11/28/2018   KAPLAMBRATIO 0.04 (L) 11/28/2018   Lab Results  Component Value Date    IGGSERUM 377 (L) 11/28/2018   IGA 18 (L) 11/28/2018   IGMSERUM 5 (L) 11/28/2018   Lab Results  Component Value Date   TOTALPROTELP 6.3 11/28/2018   ALBUMINELP 4.1 11/28/2018   A1GS 0.2 11/28/2018   A2GS 0.8 11/28/2018   BETS 0.9 11/28/2018   BETA2SER 0.4 11/23/2014   GAMS 0.3 (L) 11/28/2018   MSPIKE 0.1 (H) 11/28/2018   SPEI Comment 11/28/2018     Chemistry      Component Value Date/Time   NA 141 11/28/2018 0900   NA 141 01/10/2017 1115   NA 140 06/21/2016 0918   K 4.1 11/28/2018 0900   K 4.0 01/10/2017 1115   K 4.3 06/21/2016 0918   CL 107 11/28/2018 0900   CL 106 01/10/2017 1115   CO2 26 11/28/2018 0900   CO2 27 01/10/2017 1115   CO2  20 (L) 06/21/2016 0918   BUN 16 11/28/2018 0900   BUN 15 01/10/2017 1115   BUN 15.8 06/21/2016 0918   CREATININE 0.90 11/28/2018 0900   CREATININE 1.0 01/10/2017 1115   CREATININE 0.8 06/21/2016 0918      Component Value Date/Time   CALCIUM 9.7 11/28/2018 0900   CALCIUM 9.5 01/10/2017 1115   CALCIUM 9.4 06/21/2016 0918   ALKPHOS 34 (L) 11/28/2018 0900   ALKPHOS 40 01/10/2017 1115   ALKPHOS 66 06/21/2016 0918   AST 12 (L) 11/28/2018 0900   AST 17 06/21/2016 0918   ALT 13 11/28/2018 0900   ALT 19 01/10/2017 1115   ALT 37 06/21/2016 0918   BILITOT 0.4 11/28/2018 0900   BILITOT 0.32 06/21/2016 0918       Impression and Plan: Emily Greer is a very pleasant 68 yo African American female with recurrent lambda light chain myeloma. Her second a stem cell transplant for the recurrent lambda light chain myeloma was in June 2016.  Shealsohaslocalized recurrent endometrial cancer (followed by gyn onc Dr. Nelly Rout). We will proceed with treatment today as planned and see her again in another 4 weeks.  She will receive IV iron today and again next week.  She will contact our office with any questions or concerns. We can certainly see her sooner if needed.   Emeline Gins, NP 12/3/20208:58 AM

## 2019-01-03 LAB — PROTEIN ELECTROPHORESIS, SERUM, WITH REFLEX
A/G Ratio: 1.9 — ABNORMAL HIGH (ref 0.7–1.7)
Albumin ELP: 4 g/dL (ref 2.9–4.4)
Alpha-1-Globulin: 0.2 g/dL (ref 0.0–0.4)
Alpha-2-Globulin: 0.6 g/dL (ref 0.4–1.0)
Beta Globulin: 0.9 g/dL (ref 0.7–1.3)
Gamma Globulin: 0.3 g/dL — ABNORMAL LOW (ref 0.4–1.8)
Globulin, Total: 2.1 g/dL — ABNORMAL LOW (ref 2.2–3.9)
Total Protein ELP: 6.1 g/dL (ref 6.0–8.5)

## 2019-01-03 LAB — IGG, IGA, IGM
IgA: 19 mg/dL — ABNORMAL LOW (ref 87–352)
IgG (Immunoglobin G), Serum: 373 mg/dL — ABNORMAL LOW (ref 586–1602)
IgM (Immunoglobulin M), Srm: <5 mg/dL — ABNORMAL LOW (ref 26–217)

## 2019-01-03 LAB — KAPPA/LAMBDA LIGHT CHAINS
Kappa free light chain: 3.9 mg/L (ref 3.3–19.4)
Kappa, lambda light chain ratio: 0.04 — ABNORMAL LOW (ref 0.26–1.65)
Lambda free light chains: 94.6 mg/L — ABNORMAL HIGH (ref 5.7–26.3)

## 2019-01-06 ENCOUNTER — Telehealth: Payer: Self-pay | Admitting: *Deleted

## 2019-01-06 ENCOUNTER — Telehealth: Payer: Self-pay

## 2019-01-06 NOTE — Telephone Encounter (Signed)
As noted below by Dr. Marin Olp, I left a message informing patient of the myeloma lab results. Instructed her to call if she had any questions.

## 2019-01-06 NOTE — Telephone Encounter (Signed)
Per provider  Rodriguez[place], Sunday Spillers, PA-C  Candiss Norse T, CMA        Please inform pt that her bone density result is normal.    Spoke with pt gave her provider message

## 2019-01-06 NOTE — Telephone Encounter (Signed)
-----   Message from [name], MD sent at 01/03/2019  4:57 PM EST ----- Call -the myeloma is holding stable!!  This is great!!  [name]

## 2019-01-09 ENCOUNTER — Inpatient Hospital Stay: Payer: Medicare Other

## 2019-01-09 ENCOUNTER — Other Ambulatory Visit: Payer: Self-pay

## 2019-01-09 VITALS — BP 135/77 | HR 90 | Temp 98.1°F | Resp 20

## 2019-01-09 DIAGNOSIS — Z95828 Presence of other vascular implants and grafts: Secondary | ICD-10-CM

## 2019-01-09 DIAGNOSIS — D508 Other iron deficiency anemias: Secondary | ICD-10-CM

## 2019-01-09 DIAGNOSIS — C9001 Multiple myeloma in remission: Secondary | ICD-10-CM

## 2019-01-09 DIAGNOSIS — Z5112 Encounter for antineoplastic immunotherapy: Secondary | ICD-10-CM | POA: Diagnosis not present

## 2019-01-09 DIAGNOSIS — C9000 Multiple myeloma not having achieved remission: Secondary | ICD-10-CM

## 2019-01-09 DIAGNOSIS — C541 Malignant neoplasm of endometrium: Secondary | ICD-10-CM

## 2019-01-09 MED ORDER — SODIUM CHLORIDE 0.9 % IV SOLN
510.0000 mg | Freq: Once | INTRAVENOUS | Status: AC
  Administered 2019-01-09: 510 mg via INTRAVENOUS
  Filled 2019-01-09: qty 510

## 2019-01-09 MED ORDER — HEPARIN SOD (PORK) LOCK FLUSH 100 UNIT/ML IV SOLN
500.0000 [IU] | Freq: Once | INTRAVENOUS | Status: AC
  Administered 2019-01-09: 500 [IU] via INTRAVENOUS
  Filled 2019-01-09: qty 5

## 2019-01-09 MED ORDER — SODIUM CHLORIDE 0.9 % IV SOLN
Freq: Once | INTRAVENOUS | Status: AC
  Administered 2019-01-09: 13:00:00 via INTRAVENOUS
  Filled 2019-01-09: qty 250

## 2019-01-09 MED ORDER — SODIUM CHLORIDE 0.9% FLUSH
10.0000 mL | Freq: Once | INTRAVENOUS | Status: AC
  Administered 2019-01-09: 10 mL via INTRAVENOUS
  Filled 2019-01-09: qty 10

## 2019-01-09 NOTE — Progress Notes (Signed)
Pt refused to stay for 30 minute observation.

## 2019-01-09 NOTE — Patient Instructions (Signed)

## 2019-01-20 ENCOUNTER — Telehealth: Payer: Self-pay | Admitting: Pharmacy Technician

## 2019-01-20 ENCOUNTER — Telehealth: Payer: Self-pay

## 2019-01-20 NOTE — Telephone Encounter (Signed)
Oral Oncology Patient Advocate Encounter  Received notification from [facility] that prior authorization for Pomalyst is required.  PA faxed to [phone]  Status is pending  Oral Oncology Clinic will continue to follow.  [place] Patient [place] Phone [phone] Fax [phone] 01/20/2019 4:32 PM

## 2019-01-21 ENCOUNTER — Telehealth: Payer: Self-pay | Admitting: *Deleted

## 2019-01-21 ENCOUNTER — Ambulatory Visit: Payer: Self-pay

## 2019-01-21 DIAGNOSIS — Z6834 Body mass index (BMI) 34.0-34.9, adult: Secondary | ICD-10-CM

## 2019-01-21 DIAGNOSIS — E6609 Other obesity due to excess calories: Secondary | ICD-10-CM

## 2019-01-21 DIAGNOSIS — I10 Essential (primary) hypertension: Secondary | ICD-10-CM

## 2019-01-21 NOTE — Telephone Encounter (Signed)
Fax from [facility] for [employer] PA.  PA # [id number] MB authorization from 01/20/2019-12/21-2021

## 2019-01-22 NOTE — Chronic Care Management (AMB) (Signed)
Chronic Care Management   Outreach Note  01/22/2019 Name: Emily Greer MRN: [id number] DOB: 05/02/1950  Referred by: [name], MD Reason for referral : Chronic Care Management (INITIAL CCM RNCM Telephone Outreach )   An unsuccessful telephone outreach was attempted today. The patient was referred to the case management team by [name] MD for assistance with care management and care coordination.   Follow Up Plan: Telephone follow up appointment with care management team member scheduled for: 01/27/19  Delsa Sale, RN, BSN, CCM Care Management Coordinator [facility] Care Management/[facility]  Direct Phone: [phone]

## 2019-01-27 ENCOUNTER — Ambulatory Visit (INDEPENDENT_AMBULATORY_CARE_PROVIDER_SITE_OTHER): Payer: Medicare Other

## 2019-01-27 ENCOUNTER — Telehealth: Payer: Self-pay

## 2019-01-27 DIAGNOSIS — I10 Essential (primary) hypertension: Secondary | ICD-10-CM | POA: Diagnosis not present

## 2019-01-27 DIAGNOSIS — E6609 Other obesity due to excess calories: Secondary | ICD-10-CM

## 2019-01-27 NOTE — Chronic Care Management (AMB) (Signed)
Chronic Care Management   Outreach Note  01/27/2019 Name: Emily Greer MRN: [id number] DOB: [birth year]  Referred by: [name], MD Reason for referral : Chronic Care Management (#2 INITIAL CCM RNCM Telephone Outreach )   A second unsuccessful telephone outreach was attempted today. The patient was referred to the case management team for assistance with care management and care coordination.   Follow Up Plan: Telephone follow up appointment with care management team member scheduled for: 03/04/19  Delsa Sale, RN, BSN, CCM Care Management Coordinator [facility] Care Management/[facility]  Direct Phone: [phone]

## 2019-01-28 NOTE — Telephone Encounter (Signed)
Oral Oncology Patient Advocate Encounter  Prior Authorization for Pomalyst has been approved.    PA# [id number]  Effective dates: 01/20/2019 through 01/20/2020  Oral Oncology Clinic will continue to follow.   [place] Patient [place] Phone [phone] Fax [phone] 01/28/2019 2:01 PM

## 2019-01-30 ENCOUNTER — Other Ambulatory Visit: Payer: Self-pay

## 2019-01-30 ENCOUNTER — Encounter: Payer: Self-pay | Admitting: Hematology & Oncology

## 2019-01-30 ENCOUNTER — Inpatient Hospital Stay: Payer: Medicare Other

## 2019-01-30 ENCOUNTER — Inpatient Hospital Stay (HOSPITAL_BASED_OUTPATIENT_CLINIC_OR_DEPARTMENT_OTHER): Payer: Medicare Other | Admitting: Hematology & Oncology

## 2019-01-30 VITALS — BP 139/66 | HR 76 | Resp 20

## 2019-01-30 VITALS — BP 138/71 | HR 95 | Temp 96.8°F | Resp 20 | Wt 193.0 lb

## 2019-01-30 DIAGNOSIS — C9000 Multiple myeloma not having achieved remission: Secondary | ICD-10-CM

## 2019-01-30 DIAGNOSIS — Z95828 Presence of other vascular implants and grafts: Secondary | ICD-10-CM

## 2019-01-30 DIAGNOSIS — Z5112 Encounter for antineoplastic immunotherapy: Secondary | ICD-10-CM | POA: Diagnosis not present

## 2019-01-30 DIAGNOSIS — C541 Malignant neoplasm of endometrium: Secondary | ICD-10-CM

## 2019-01-30 DIAGNOSIS — D500 Iron deficiency anemia secondary to blood loss (chronic): Secondary | ICD-10-CM | POA: Diagnosis not present

## 2019-01-30 DIAGNOSIS — C9001 Multiple myeloma in remission: Secondary | ICD-10-CM

## 2019-01-30 LAB — CBC WITH DIFFERENTIAL (CANCER CENTER ONLY)
Abs Immature Granulocytes: 0.03 10*3/uL (ref 0.00–0.07)
Basophils Absolute: 0.1 10*3/uL (ref 0.0–0.1)
Basophils Relative: 2 %
Eosinophils Absolute: 0.1 10*3/uL (ref 0.0–0.5)
Eosinophils Relative: 3 %
HCT: 34.2 % — ABNORMAL LOW (ref 36.0–46.0)
Hemoglobin: 11.2 g/dL — ABNORMAL LOW (ref 12.0–15.0)
Immature Granulocytes: 1 %
Lymphocytes Relative: 16 %
Lymphs Abs: 0.8 10*3/uL (ref 0.7–4.0)
MCH: 27.6 pg (ref 26.0–34.0)
MCHC: 32.7 g/dL (ref 30.0–36.0)
MCV: 84.2 fL (ref 80.0–100.0)
Monocytes Absolute: 1 10*3/uL (ref 0.1–1.0)
Monocytes Relative: 21 %
Neutro Abs: 2.9 10*3/uL (ref 1.7–7.7)
Neutrophils Relative %: 57 %
Platelet Count: 222 10*3/uL (ref 150–400)
RBC: 4.06 MIL/uL (ref 3.87–5.11)
RDW: 17 % — ABNORMAL HIGH (ref 11.5–15.5)
WBC Count: 5 10*3/uL (ref 4.0–10.5)
nRBC: 0 % (ref 0.0–0.2)

## 2019-01-30 LAB — CMP (CANCER CENTER ONLY)
ALT: 16 U/L (ref 0–44)
AST: 12 U/L — ABNORMAL LOW (ref 15–41)
Albumin: 4.6 g/dL (ref 3.5–5.0)
Alkaline Phosphatase: 40 U/L (ref 38–126)
Anion gap: 10 (ref 5–15)
BUN: 22 mg/dL (ref 8–23)
CO2: 27 mmol/L (ref 22–32)
Calcium: 10 mg/dL (ref 8.9–10.3)
Chloride: 104 mmol/L (ref 98–111)
Creatinine: 0.98 mg/dL (ref 0.44–1.00)
GFR, Est AFR Am: >60 mL/min (ref >60)
GFR, Estimated: >60 mL/min (ref >60)
Glucose, Bld: 103 mg/dL — ABNORMAL HIGH (ref 70–99)
Potassium: 3.5 mmol/L (ref 3.5–5.1)
Sodium: 141 mmol/L (ref 135–145)
Total Bilirubin: 0.3 mg/dL (ref 0.3–1.2)
Total Protein: 6.5 g/dL (ref 6.5–8.1)

## 2019-01-30 LAB — FERRITIN: Ferritin: 2283 ng/mL — ABNORMAL HIGH (ref 11–307)

## 2019-01-30 LAB — IRON AND TIBC
Iron: 71 ug/dL (ref 41–142)
Saturation Ratios: 22 % (ref 21–57)
TIBC: 329 ug/dL (ref 236–444)
UIBC: 258 ug/dL (ref 120–384)

## 2019-01-30 MED ORDER — DIPHENHYDRAMINE HCL 25 MG PO CAPS
50.0000 mg | ORAL_CAPSULE | Freq: Once | ORAL | Status: AC
  Administered 2019-01-30: 50 mg via ORAL

## 2019-01-30 MED ORDER — DEXAMETHASONE 4 MG PO TABS
ORAL_TABLET | ORAL | Status: AC
  Filled 2019-01-30: qty 5

## 2019-01-30 MED ORDER — SODIUM CHLORIDE 0.9% FLUSH
10.0000 mL | INTRAVENOUS | Status: DC | PRN
  Administered 2019-01-30: 10 mL via INTRAVENOUS
  Filled 2019-01-30: qty 10

## 2019-01-30 MED ORDER — ACETAMINOPHEN 325 MG PO TABS
ORAL_TABLET | ORAL | Status: AC
  Filled 2019-01-30: qty 2

## 2019-01-30 MED ORDER — ACETAMINOPHEN 325 MG PO TABS
650.0000 mg | ORAL_TABLET | Freq: Once | ORAL | Status: AC
  Administered 2019-01-30: 650 mg via ORAL

## 2019-01-30 MED ORDER — SODIUM CHLORIDE 0.9% FLUSH
3.0000 mL | INTRAVENOUS | Status: DC | PRN
  Filled 2019-01-30: qty 10

## 2019-01-30 MED ORDER — DIPHENHYDRAMINE HCL 25 MG PO CAPS
ORAL_CAPSULE | ORAL | Status: AC
  Filled 2019-01-30: qty 2

## 2019-01-30 MED ORDER — HEPARIN SOD (PORK) LOCK FLUSH 100 UNIT/ML IV SOLN
250.0000 [IU] | Freq: Once | INTRAVENOUS | Status: DC | PRN
  Filled 2019-01-30: qty 5

## 2019-01-30 MED ORDER — DEXAMETHASONE 4 MG PO TABS
20.0000 mg | ORAL_TABLET | Freq: Once | ORAL | Status: AC
  Administered 2019-01-30: 20 mg via ORAL

## 2019-01-30 MED ORDER — HEPARIN SOD (PORK) LOCK FLUSH 100 UNIT/ML IV SOLN
500.0000 [IU] | Freq: Once | INTRAVENOUS | Status: AC
  Administered 2019-01-30: 500 [IU] via INTRAVENOUS
  Filled 2019-01-30: qty 5

## 2019-01-30 MED ORDER — SODIUM CHLORIDE 0.9 % IV SOLN
Freq: Once | INTRAVENOUS | Status: DC
  Filled 2019-01-30: qty 250

## 2019-01-30 MED ORDER — DARATUMUMAB-HYALURONIDASE-FIHJ 1800-30000 MG-UT/15ML ~~LOC~~ SOLN
1800.0000 mg | Freq: Once | SUBCUTANEOUS | Status: AC
  Administered 2019-01-30: 1800 mg via SUBCUTANEOUS
  Filled 2019-01-30: qty 15

## 2019-01-30 NOTE — Progress Notes (Signed)
Pt is going to stop and make an infusion appointment for 02/27/19. She has a lab, port flush, MD appt already. Verbalized understanding.

## 2019-01-30 NOTE — Patient Instructions (Signed)
[place] Discharge Instructions for Patients Receiving Chemotherapy  Today you received the following chemotherapy agents Darzalex To help prevent nausea and vomiting after your treatment, we encourage you to take your nausea medication as prescribed.   If you develop nausea and vomiting that is not controlled by your nausea medication, call the clinic.   BELOW ARE SYMPTOMS THAT SHOULD BE REPORTED IMMEDIATELY:  *FEVER GREATER THAN 100.5 F  *CHILLS WITH OR WITHOUT FEVER  NAUSEA AND VOMITING THAT IS NOT CONTROLLED WITH YOUR NAUSEA MEDICATION  *UNUSUAL SHORTNESS OF BREATH  *UNUSUAL BRUISING OR BLEEDING  TENDERNESS IN MOUTH AND THROAT WITH OR WITHOUT PRESENCE OF ULCERS  *URINARY PROBLEMS  *BOWEL PROBLEMS  UNUSUAL RASH Items with * indicate a potential emergency and should be followed up as soon as possible.  Feel free to call the clinic should you have any questions or concerns. The clinic phone number is [phone].  Please show the [place] at check-in to the Emergency Department and triage nurse.  Ferumoxytol injection (Feraheme) What is this medicine? FERUMOXYTOL is an iron complex. Iron is used to make healthy red blood cells, which carry oxygen and nutrients throughout the body. This medicine is used to treat iron deficiency anemia. This medicine may be used for other purposes; ask your health care provider or pharmacist if you have questions. COMMON BRAND NAME(S): Feraheme What should I tell my health care provider before I take this medicine? They need to know if you have any of these conditions:  anemia not caused by low iron levels  high levels of iron in the blood  magnetic resonance imaging (MRI) test scheduled  an unusual or allergic reaction to iron, other medicines, foods, dyes, or preservatives  pregnant or trying to get pregnant  breast-feeding How should I use this medicine? This medicine is for injection into a vein. It  is given by a health care professional in a hospital or clinic setting. Talk to your pediatrician regarding the use of this medicine in children. Special care may be needed. Overdosage: If you think you have taken too much of this medicine contact a poison control center or emergency room at once. NOTE: This medicine is only for you. Do not share this medicine with others. What if I miss a dose? It is important not to miss your dose. Call your doctor or health care professional if you are unable to keep an appointment. What may interact with this medicine? This medicine may interact with the following medications:  other iron products This list may not describe all possible interactions. Give your health care provider a list of all the medicines, herbs, non-prescription drugs, or dietary supplements you use. Also tell them if you smoke, drink alcohol, or use illegal drugs. Some items may interact with your medicine. What should I watch for while using this medicine? Visit your doctor or healthcare professional regularly. Tell your doctor or healthcare professional if your symptoms do not start to get better or if they get worse. You may need blood work done while you are taking this medicine. You may need to follow a special diet. Talk to your doctor. Foods that contain iron include: whole grains/cereals, dried fruits, beans, or peas, leafy green vegetables, and organ meats (liver, kidney). What side effects may I notice from receiving this medicine? Side effects that you should report to your doctor or health care professional as soon as possible:  allergic reactions like skin rash, itching or hives, swelling of  the face, lips, or tongue  breathing problems  changes in blood pressure  feeling faint or lightheaded, falls  fever or chills  flushing, sweating, or hot feelings  swelling of the ankles or feet Side effects that usually do not require medical attention (report to your doctor  or health care professional if they continue or are bothersome):  diarrhea  headache  nausea, vomiting  stomach pain This list may not describe all possible side effects. Call your doctor for medical advice about side effects. You may report side effects to FDA at 1-800-FDA-1088. Where should I keep my medicine? This drug is given in a hospital or clinic and will not be stored at home. NOTE: This sheet is a summary. It may not cover all possible information. If you have questions about this medicine, talk to your doctor, pharmacist, or health care provider.  2020 Elsevier/Gold Standard (2016-03-06 20:21:10)

## 2019-01-30 NOTE — Progress Notes (Signed)
Hematology and Oncology Follow Up Visit  [facility] [id number] Dec 06, 1950 68 y.o. 01/30/2019   Principle Diagnosis:  Recurrent lambda light chain myeloma History of recurrent endometrial carcinoma Iron deficiency anemia-blood loss  PastTherapy: Status post second autologous stem cell transplant on 07/24/2014 Maintenance therapy with Pomalidomide/every 2 week Velcade - d/c'ed Xgeva 120 mg subcutaneous every 3months -next dose inOctober 2020 Radiation therapy for endometrial recurrence - completed 04/20/2015 Pomalyst/Kyprolis 70mg /m2 IV q 2 weeks - s/p cycle #12 - held on 12/26/2017 for hematuria  Current Therapy:   Daratumumab/Pomalyst (1 mg) - status post cycle #11 Femara 2.5 mg po q day IV iron as indicated   Interim History:  [facility] is here today for follow-up and treatment.  Overall, I think she is doing pretty well.  She really has had no specific complaints with respect to the myeloma.  When we last checked her light chain, it was 9.5 mg/dL.  This was in early December.  Prior to this, it was 9.3 mg/dL.  Her husband is having some issues.  I think he is going to [facility] for an evaluation.  She is having some hematuria again.  She does see urology for this.  She will let urology know regarding this.  She has had no fever.  There is been no dysuria.  She has had no problems with her bowels.  Is been no leg swelling.  She has had no cough.  She has had no nausea or vomiting.  She did have a nice Christmas.  Her daughter came up from [place].  That was fine.  Her son and his family are up in [place].  She has had no headache.  Has been no problem with mouth sores.  She is also taking the pomalidomide.  I think she is doing pretty well with this.  Overall, her performance status is ECOG 1.   Medications:  Allergies as of 01/30/2019      Reactions   Codeine Nausea Only      Medication List       Accurate as of  January 30, 2019  9:25 AM. If you have any questions, ask your nurse or doctor.        acyclovir 400 MG tablet Commonly known as: ZOVIRAX Take 1 tablet (400 mg total) by mouth 2 (two) times daily.   albuterol 108 (90 Base) MCG/ACT inhaler Commonly known as: VENTOLIN HFA Inhale 2 puffs into the lungs every 6 (six) hours as needed for wheezing or shortness of breath. 2 puffs 3 times daily x 5 days then every 6 hours as needed.   amLODipine 10 MG tablet Commonly known as: NORVASC TAKE 1 TABLET BY MOUTH EVERY MORNING   aspirin EC 81 MG tablet Take 81 mg by mouth 2 (two) times daily. Stopped  By dr Myna Hidalgo until urology procedures complete   CALCIUM 1200+D3 PO Take 1 tablet by mouth daily.   letrozole 2.5 MG tablet Commonly known as: FEMARA Take 1 tablet (2.5 mg total) by mouth daily.   loperamide 2 MG capsule Commonly known as: IMODIUM Take by mouth as needed for diarrhea or loose stools. Reported on 08/19/2015   loratadine 10 MG tablet Commonly known as: CLARITIN Take 10 mg by mouth every morning. Reported on 08/19/2015   LORazepam 0.5 MG tablet Commonly known as: Ativan Take 1 tablet (0.5 mg total) by mouth every 6 (six) hours as needed (Nausea or vomiting).   montelukast 10 MG tablet Commonly known as: SINGULAIR TAKE  1 TABLET(10 MG) BY MOUTH AT BEDTIME   Nasacort Allergy 24HR 55 MCG/ACT Aero nasal inhaler Generic drug: triamcinolone Place 2 sprays into the nose as needed.   pomalidomide 1 MG capsule Commonly known as: Pomalyst TAKE 1 CAPSULE BY MOUTH ONCE DAILY FOR 21 DAYS ON AND 7 DAYS OFF   potassium chloride SA 20 MEQ tablet Commonly known as: KLOR-CON TAKE 1 TABLET(20 MEQ) BY MOUTH TWICE DAILY   traMADol 50 MG tablet Commonly known as: Ultram Take 1 tablet (50 mg total) by mouth every 6 (six) hours as needed.   triamterene-hydrochlorothiazide 37.5-25 MG tablet Commonly known as: MAXZIDE-25 1 tablet by mouth at bedtime   Vitamin D3 50 MCG (2000 UT)  Tabs Take 2 tablets by mouth daily.       Allergies:  Allergies  Allergen Reactions  . Codeine Nausea Only    Past Medical History, Surgical history, Social history, and Family History were reviewed and updated.  Review of Systems: Review of Systems  Constitutional: Negative.   HENT: Negative.   Eyes: Negative.   Cardiovascular: Negative.   Gastrointestinal: Negative.   Genitourinary: Positive for hematuria.  Musculoskeletal: Negative.   Skin: Negative.   Neurological: Negative.   Endo/Heme/Allergies: Negative.   Psychiatric/Behavioral: Negative.      Physical Exam:  weight is 193 lb (87.5 kg). Her temporal temperature is 96.8 F (36 C) (abnormal). Her blood pressure is 138/71 and her pulse is 95. Her respiration is 20 and oxygen saturation is 98%.   Wt Readings from Last 3 Encounters:  01/30/19 193 lb (87.5 kg)  01/02/19 198 lb 12.8 oz (90.2 kg)  11/28/18 193 lb (87.5 kg)      Physical Activity: Inactive  . Days of Exercise per Week: 0 days  . Minutes of Exercise per Session: 0 min   Physical Exam Vitals reviewed.  HENT:     Head: Normocephalic and atraumatic.  Eyes:     Pupils: Pupils are equal, round, and reactive to light.  Cardiovascular:     Rate and Rhythm: Normal rate and regular rhythm.     Heart sounds: Normal heart sounds.  Pulmonary:     Effort: Pulmonary effort is normal.     Breath sounds: Normal breath sounds.  Abdominal:     General: Bowel sounds are normal.     Palpations: Abdomen is soft.  Musculoskeletal:        General: No tenderness or deformity. Normal range of motion.     Cervical back: Normal range of motion.  Lymphadenopathy:     Cervical: No cervical adenopathy.  Skin:    General: Skin is warm and dry.     Findings: No erythema or rash.  Neurological:     Mental Status: She is alert and oriented to person, place, and time.  Psychiatric:        Behavior: Behavior normal.        Thought Content: Thought content normal.         Judgment: Judgment normal.      Lab Results  Component Value Date   WBC 4.8 01/02/2019   HGB 10.7 (L) 01/02/2019   HCT 32.5 (L) 01/02/2019   MCV 82.9 01/02/2019   PLT 197 01/02/2019   Lab Results  Component Value Date   FERRITIN 665 (H) 11/28/2018   IRON 67 11/28/2018   TIBC 366 11/28/2018   UIBC 299 11/28/2018   IRONPCTSAT 18 (L) 11/28/2018   Lab Results  Component Value Date   RETICCTPCT 2.9 (H)  10/17/2017   RBC 3.92 01/02/2019   Lab Results  Component Value Date   KPAFRELGTCHN 3.9 01/02/2019   LAMBDASER 94.6 (H) 01/02/2019   KAPLAMBRATIO 0.04 (L) 01/02/2019   Lab Results  Component Value Date   IGGSERUM 373 (L) 01/02/2019   IGA 19 (L) 01/02/2019   IGMSERUM <5 (L) 01/02/2019   Lab Results  Component Value Date   TOTALPROTELP 6.1 01/02/2019   ALBUMINELP 4.0 01/02/2019   A1GS 0.2 01/02/2019   A2GS 0.6 01/02/2019   BETS 0.9 01/02/2019   BETA2SER 0.4 11/23/2014   GAMS 0.3 (L) 01/02/2019   MSPIKE Not Observed 01/02/2019   SPEI Comment 11/28/2018     Chemistry      Component Value Date/Time   NA 141 01/02/2019 0836   NA 141 01/10/2017 1115   NA 140 06/21/2016 0918   K 3.5 01/02/2019 0836   K 4.0 01/10/2017 1115   K 4.3 06/21/2016 0918   CL 105 01/02/2019 0836   CL 106 01/10/2017 1115   CO2 26 01/02/2019 0836   CO2 27 01/10/2017 1115   CO2 20 (L) 06/21/2016 0918   BUN 15 01/02/2019 0836   BUN 15 01/10/2017 1115   BUN 15.8 06/21/2016 0918   CREATININE 0.85 01/02/2019 0836   CREATININE 1.0 01/10/2017 1115   CREATININE 0.8 06/21/2016 0918      Component Value Date/Time   CALCIUM 9.9 01/02/2019 0836   CALCIUM 9.5 01/10/2017 1115   CALCIUM 9.4 06/21/2016 0918   ALKPHOS 37 (L) 01/02/2019 0836   ALKPHOS 40 01/10/2017 1115   ALKPHOS 66 06/21/2016 0918   AST 12 (L) 01/02/2019 0836   AST 17 06/21/2016 0918   ALT 16 01/02/2019 0836   ALT 19 01/10/2017 1115   ALT 37 06/21/2016 0918   BILITOT 0.5 01/02/2019 0836   BILITOT 0.32 06/21/2016 0918        Impression and Plan: Ms. Margiotta is a very pleasant 68 yo African American female with recurrent lambda light chain myeloma. Her second a stem cell transplant for light chain myeloma was in June 2016.   Shealsohaslocalized recurrent endometrial cancer (followed by gyn onc Dr. Nelly Rout).  She is on Femara for this.  I think that myeloma is holding pretty steady.  We will see what her lambda light chain level is.  Hopefully, everything is holding stable for her.  If we find that things are starting to progress, we may have to consider her for CAR-T therapy.  We will proceed with treatment today as planned and see her back in another 4 weeks.   She will contact our office with any questions or concerns. We can certainly see her sooner if needed.   Josph Macho, MD 12/31/20209:25 AM

## 2019-01-30 NOTE — Patient Instructions (Signed)

## 2019-01-31 LAB — IGG, IGA, IGM
IgA: 21 mg/dL — ABNORMAL LOW (ref 87–352)
IgG (Immunoglobin G), Serum: 345 mg/dL — ABNORMAL LOW (ref 586–1602)
IgM (Immunoglobulin M), Srm: <5 mg/dL — ABNORMAL LOW (ref 26–217)

## 2019-02-03 DIAGNOSIS — R310 Gross hematuria: Secondary | ICD-10-CM | POA: Diagnosis not present

## 2019-02-03 LAB — PROTEIN ELECTROPHORESIS, SERUM
A/G Ratio: 1.9 — ABNORMAL HIGH (ref 0.7–1.7)
Albumin ELP: 4.1 g/dL (ref 2.9–4.4)
Alpha-1-Globulin: 0.2 g/dL (ref 0.0–0.4)
Alpha-2-Globulin: 0.8 g/dL (ref 0.4–1.0)
Beta Globulin: 0.9 g/dL (ref 0.7–1.3)
Gamma Globulin: 0.2 g/dL — ABNORMAL LOW (ref 0.4–1.8)
Globulin, Total: 2.2 g/dL (ref 2.2–3.9)
Total Protein ELP: 6.3 g/dL (ref 6.0–8.5)

## 2019-02-03 LAB — KAPPA/LAMBDA LIGHT CHAINS
Kappa free light chain: 2 mg/L — ABNORMAL LOW (ref 3.3–19.4)
Kappa, lambda light chain ratio: 0.02 — ABNORMAL LOW (ref 0.26–1.65)
Lambda free light chains: 108.1 mg/L — ABNORMAL HIGH (ref 5.7–26.3)

## 2019-02-04 ENCOUNTER — Other Ambulatory Visit: Payer: Self-pay | Admitting: *Deleted

## 2019-02-04 ENCOUNTER — Other Ambulatory Visit: Payer: Self-pay | Admitting: Hematology & Oncology

## 2019-02-04 DIAGNOSIS — C9000 Multiple myeloma not having achieved remission: Secondary | ICD-10-CM

## 2019-02-04 MED ORDER — POMALIDOMIDE 1 MG PO CAPS: ORAL_CAPSULE | ORAL | 0 refills | Status: DC

## 2019-02-27 ENCOUNTER — Inpatient Hospital Stay: Payer: Medicare Other

## 2019-02-27 ENCOUNTER — Other Ambulatory Visit: Payer: Self-pay

## 2019-02-27 ENCOUNTER — Inpatient Hospital Stay: Payer: Medicare Other | Attending: Hematology & Oncology | Admitting: Hematology & Oncology

## 2019-02-27 ENCOUNTER — Encounter: Payer: Self-pay | Admitting: Hematology & Oncology

## 2019-02-27 VITALS — BP 140/66 | HR 88 | Temp 97.8°F | Resp 17 | Wt 193.0 lb

## 2019-02-27 DIAGNOSIS — Z923 Personal history of irradiation: Secondary | ICD-10-CM | POA: Insufficient documentation

## 2019-02-27 DIAGNOSIS — C9000 Multiple myeloma not having achieved remission: Secondary | ICD-10-CM | POA: Diagnosis not present

## 2019-02-27 DIAGNOSIS — Z8542 Personal history of malignant neoplasm of other parts of uterus: Secondary | ICD-10-CM | POA: Insufficient documentation

## 2019-02-27 DIAGNOSIS — Z885 Allergy status to narcotic agent status: Secondary | ICD-10-CM | POA: Insufficient documentation

## 2019-02-27 DIAGNOSIS — Z5112 Encounter for antineoplastic immunotherapy: Secondary | ICD-10-CM | POA: Diagnosis present

## 2019-02-27 DIAGNOSIS — D500 Iron deficiency anemia secondary to blood loss (chronic): Secondary | ICD-10-CM

## 2019-02-27 DIAGNOSIS — Z79899 Other long term (current) drug therapy: Secondary | ICD-10-CM | POA: Diagnosis not present

## 2019-02-27 DIAGNOSIS — C541 Malignant neoplasm of endometrium: Secondary | ICD-10-CM

## 2019-02-27 DIAGNOSIS — Z95828 Presence of other vascular implants and grafts: Secondary | ICD-10-CM

## 2019-02-27 LAB — CBC WITH DIFFERENTIAL (CANCER CENTER ONLY)
Abs Immature Granulocytes: 0.02 10*3/uL (ref 0.00–0.07)
Basophils Absolute: 0.1 10*3/uL (ref 0.0–0.1)
Basophils Relative: 3 %
Eosinophils Absolute: 0.1 10*3/uL (ref 0.0–0.5)
Eosinophils Relative: 3 %
HCT: 34.8 % — ABNORMAL LOW (ref 36.0–46.0)
Hemoglobin: 11.4 g/dL — ABNORMAL LOW (ref 12.0–15.0)
Immature Granulocytes: 1 %
Lymphocytes Relative: 21 %
Lymphs Abs: 0.9 10*3/uL (ref 0.7–4.0)
MCH: 27.8 pg (ref 26.0–34.0)
MCHC: 32.8 g/dL (ref 30.0–36.0)
MCV: 84.9 fL (ref 80.0–100.0)
Monocytes Absolute: 0.7 10*3/uL (ref 0.1–1.0)
Monocytes Relative: 17 %
Neutro Abs: 2.3 10*3/uL (ref 1.7–7.7)
Neutrophils Relative %: 55 %
Platelet Count: 246 10*3/uL (ref 150–400)
RBC: 4.1 MIL/uL (ref 3.87–5.11)
RDW: 16.5 % — ABNORMAL HIGH (ref 11.5–15.5)
WBC Count: 4.3 10*3/uL (ref 4.0–10.5)
nRBC: 0 % (ref 0.0–0.2)

## 2019-02-27 LAB — CMP (CANCER CENTER ONLY)
ALT: 15 U/L (ref 0–44)
AST: 10 U/L — ABNORMAL LOW (ref 15–41)
Albumin: 4.4 g/dL (ref 3.5–5.0)
Alkaline Phosphatase: 40 U/L (ref 38–126)
Anion gap: 8 (ref 5–15)
BUN: 16 mg/dL (ref 8–23)
CO2: 26 mmol/L (ref 22–32)
Calcium: 9.3 mg/dL (ref 8.9–10.3)
Chloride: 108 mmol/L (ref 98–111)
Creatinine: 0.86 mg/dL (ref 0.44–1.00)
GFR, Est AFR Am: >60 mL/min (ref >60)
GFR, Estimated: >60 mL/min (ref >60)
Glucose, Bld: 94 mg/dL (ref 70–99)
Potassium: 3.7 mmol/L (ref 3.5–5.1)
Sodium: 142 mmol/L (ref 135–145)
Total Bilirubin: 0.4 mg/dL (ref 0.3–1.2)
Total Protein: 6.4 g/dL — ABNORMAL LOW (ref 6.5–8.1)

## 2019-02-27 MED ORDER — HEPARIN SOD (PORK) LOCK FLUSH 100 UNIT/ML IV SOLN
500.0000 [IU] | Freq: Once | INTRAVENOUS | Status: AC
  Administered 2019-02-27: 500 [IU] via INTRAVENOUS
  Filled 2019-02-27: qty 5

## 2019-02-27 MED ORDER — SODIUM CHLORIDE 0.9% FLUSH
10.0000 mL | Freq: Once | INTRAVENOUS | Status: AC
  Administered 2019-02-27: 10 mL via INTRAVENOUS
  Filled 2019-02-27: qty 10

## 2019-02-27 NOTE — Addendum Note (Signed)
Addended by: Amelia Jo I on: 02/27/2019 03:01 PM   Modules accepted: Orders

## 2019-02-27 NOTE — Progress Notes (Signed)
Hematology and Oncology Follow Up Visit  Emily [facility] [id number] [birth year] 69 y.o. 02/27/2019   Principle Diagnosis:  Recurrent lambda light chain myeloma History of recurrent endometrial carcinoma Iron deficiency anemia-blood loss  PastTherapy: Status post second autologous stem cell transplant on 07/24/2014 Maintenance therapy with Pomalidomide/every 2 week Velcade - d/c'ed Xgeva 120 mg subcutaneous every 3months -next dose inOctober 2020 Radiation therapy for endometrial recurrence - completed 04/20/2015 Pomalyst/Kyprolis 70mg /m2 IV q 2 weeks - s/p cycle #12 - held on 12/26/2017 for hematuria  Current Therapy:   Daratumumab/Pomalyst (1 mg) - status post cycle #11 Femara 2.5 mg po q day IV iron as indicated   Interim History:  Emily Greer is here today for follow-up.  Unfortunately, she is here a week early.  I am not sure as to why she was scheduled for a week earlier than when she is supposed to.  She is doing okay.  She really has had no complaints.  She has had no issues with hematuria.  She does see urology.  She has had no problems with nausea or vomiting.  She has had no cough or shortness of breath.  Her last light chain was up a little bit.  Her lambda light chain was 10.8 mg/dL.  She has had no fever..  She has already had 1 coronavirus vaccine.  There is been no leg swelling.  Overall, her performance status is ECOG 0.  Medications:  Allergies as of 02/27/2019      Reactions   Codeine Nausea Only      Medication List       Accurate as of February 27, 2019 11:45 AM. If you have any questions, ask your nurse or doctor.        acyclovir 400 MG tablet Commonly known as: ZOVIRAX Take 1 tablet (400 mg total) by mouth 2 (two) times daily.   albuterol 108 (90 Base) MCG/ACT inhaler Commonly known as: VENTOLIN HFA Inhale 2 puffs into the lungs every 6 (six) hours as needed for wheezing or shortness of breath. 2 puffs 3 times daily x  5 days then every 6 hours as needed.   amLODipine 10 MG tablet Commonly known as: NORVASC TAKE 1 TABLET BY MOUTH EVERY MORNING   aspirin EC 81 MG tablet Take 81 mg by mouth 2 (two) times daily. Stopped  By dr Myna Hidalgo until urology procedures complete   CALCIUM 1200+D3 PO Take 1 tablet by mouth daily.   letrozole 2.5 MG tablet Commonly known as: FEMARA Take 1 tablet (2.5 mg total) by mouth daily.   loperamide 2 MG capsule Commonly known as: IMODIUM Take by mouth as needed for diarrhea or loose stools. Reported on 08/19/2015   loratadine 10 MG tablet Commonly known as: CLARITIN Take 10 mg by mouth every morning. Reported on 08/19/2015   LORazepam 0.5 MG tablet Commonly known as: Ativan Take 1 tablet (0.5 mg total) by mouth every 6 (six) hours as needed (Nausea or vomiting).   montelukast 10 MG tablet Commonly known as: SINGULAIR TAKE 1 TABLET(10 MG) BY MOUTH AT BEDTIME   Nasacort Allergy 24HR 55 MCG/ACT Aero nasal inhaler Generic drug: triamcinolone Place 2 sprays into the nose as needed.   pomalidomide 1 MG capsule Commonly known as: Pomalyst TAKE 1 CAPSULE BY MOUTH ONCE DAILY FOR 21 DAYS ON AND 7 DAYS OFF   potassium chloride SA 20 MEQ tablet Commonly known as: KLOR-CON TAKE 1 TABLET(20 MEQ) BY MOUTH TWICE DAILY   PROBIOTIC DAILY PO Take by mouth  daily.   triamterene-hydrochlorothiazide 37.5-25 MG tablet Commonly known as: MAXZIDE-25 1 tablet by mouth at bedtime   Vitamin D3 50 MCG (2000 UT) Tabs Take 2 tablets by mouth daily.       Allergies:  Allergies  Allergen Reactions  . Codeine Nausea Only    Past Medical History, Surgical history, Social history, and Family History were reviewed and updated.  Review of Systems: Review of Systems  Constitutional: Negative.   HENT: Negative.   Eyes: Negative.   Cardiovascular: Negative.   Gastrointestinal: Negative.   Genitourinary: Positive for hematuria.  Musculoskeletal: Negative.   Skin: Negative.     Neurological: Negative.   Endo/Heme/Allergies: Negative.   Psychiatric/Behavioral: Negative.      Physical Exam:  weight is 193 lb (87.5 kg). Her temporal temperature is 97.8 F (36.6 C). Her blood pressure is 140/66 and her pulse is 88. Her respiration is 17 and oxygen saturation is 100%.   Wt Readings from Last 3 Encounters:  02/27/19 193 lb (87.5 kg)  01/30/19 193 lb (87.5 kg)  01/02/19 198 lb 12.8 oz (90.2 kg)      Physical Activity: Inactive  . Days of Exercise per Week: 0 days  . Minutes of Exercise per Session: 0 min   Physical Exam Vitals reviewed.  HENT:     Head: Normocephalic and atraumatic.  Eyes:     Pupils: Pupils are equal, round, and reactive to light.  Cardiovascular:     Rate and Rhythm: Normal rate and regular rhythm.     Heart sounds: Normal heart sounds.  Pulmonary:     Effort: Pulmonary effort is normal.     Breath sounds: Normal breath sounds.  Abdominal:     General: Bowel sounds are normal.     Palpations: Abdomen is soft.  Musculoskeletal:        General: No tenderness or deformity. Normal range of motion.     Cervical back: Normal range of motion.  Lymphadenopathy:     Cervical: No cervical adenopathy.  Skin:    General: Skin is warm and dry.     Findings: No erythema or rash.  Neurological:     Mental Status: She is alert and oriented to person, place, and time.  Psychiatric:        Behavior: Behavior normal.        Thought Content: Thought content normal.        Judgment: Judgment normal.      Lab Results  Component Value Date   WBC 4.3 02/27/2019   HGB 11.4 (L) 02/27/2019   HCT 34.8 (L) 02/27/2019   MCV 84.9 02/27/2019   PLT 246 02/27/2019   Lab Results  Component Value Date   FERRITIN 2,283 (H) 01/30/2019   IRON 71 01/30/2019   TIBC 329 01/30/2019   UIBC 258 01/30/2019   IRONPCTSAT 22 01/30/2019   Lab Results  Component Value Date   RETICCTPCT 2.9 (H) 10/17/2017   RBC 4.10 02/27/2019   Lab Results  Component  Value Date   KPAFRELGTCHN 2.0 (L) 01/30/2019   LAMBDASER 108.1 (H) 01/30/2019   KAPLAMBRATIO 0.02 (L) 01/30/2019   Lab Results  Component Value Date   IGGSERUM 345 (L) 01/30/2019   IGA 21 (L) 01/30/2019   IGMSERUM <5 (L) 01/30/2019   Lab Results  Component Value Date   TOTALPROTELP 6.3 01/30/2019   ALBUMINELP 4.1 01/30/2019   A1GS 0.2 01/30/2019   A2GS 0.8 01/30/2019   BETS 0.9 01/30/2019   BETA2SER 0.4 11/23/2014  GAMS 0.2 (L) 01/30/2019   MSPIKE Not Observed 01/30/2019   SPEI Comment 01/30/2019     Chemistry      Component Value Date/Time   NA 142 02/27/2019 1050   NA 141 01/10/2017 1115   NA 140 06/21/2016 0918   K 3.7 02/27/2019 1050   K 4.0 01/10/2017 1115   K 4.3 06/21/2016 0918   CL 108 02/27/2019 1050   CL 106 01/10/2017 1115   CO2 26 02/27/2019 1050   CO2 27 01/10/2017 1115   CO2 20 (L) 06/21/2016 0918   BUN 16 02/27/2019 1050   BUN 15 01/10/2017 1115   BUN 15.8 06/21/2016 0918   CREATININE 0.86 02/27/2019 1050   CREATININE 1.0 01/10/2017 1115   CREATININE 0.8 06/21/2016 0918      Component Value Date/Time   CALCIUM 9.3 02/27/2019 1050   CALCIUM 9.5 01/10/2017 1115   CALCIUM 9.4 06/21/2016 0918   ALKPHOS 40 02/27/2019 1050   ALKPHOS 40 01/10/2017 1115   ALKPHOS 66 06/21/2016 0918   AST 10 (L) 02/27/2019 1050   AST 17 06/21/2016 0918   ALT 15 02/27/2019 1050   ALT 19 01/10/2017 1115   ALT 37 06/21/2016 0918   BILITOT 0.4 02/27/2019 1050   BILITOT 0.32 06/21/2016 0918       Impression and Plan: Ms. Wuthrich is a very pleasant 69 yo African American female with recurrent lambda light chain myeloma. Her second a stem cell transplant for light chain myeloma was in June 2016.   Shealsohaslocalized recurrent endometrial cancer (followed by gyn onc Dr. Nelly Rout).  She is on Femara for this.  I think that myeloma is holding pretty steady.  We will see what her lambda light chain level is.  Hopefully, everything is holding stable for her.  If  we find that things are starting to progress, we may have to consider her for CAR-T therapy.  We will proceed with treatment today as planned and see her back in another 4 weeks.   She will contact our office with any questions or concerns. We can certainly see her sooner if needed.   Josph Macho, MD 1/28/202111:45 AM

## 2019-02-27 NOTE — Patient Instructions (Signed)

## 2019-02-28 LAB — PROTEIN ELECTROPHORESIS, SERUM, WITH REFLEX
A/G Ratio: 1.6 (ref 0.7–1.7)
Albumin ELP: 3.6 g/dL (ref 2.9–4.4)
Alpha-1-Globulin: 0.2 g/dL (ref 0.0–0.4)
Alpha-2-Globulin: 0.8 g/dL (ref 0.4–1.0)
Beta Globulin: 0.9 g/dL (ref 0.7–1.3)
Gamma Globulin: 0.3 g/dL — ABNORMAL LOW (ref 0.4–1.8)
Globulin, Total: 2.2 g/dL (ref 2.2–3.9)
Total Protein ELP: 5.8 g/dL — ABNORMAL LOW (ref 6.0–8.5)

## 2019-02-28 LAB — KAPPA/LAMBDA LIGHT CHAINS
Kappa free light chain: 4.6 mg/L (ref 3.3–19.4)
Kappa, lambda light chain ratio: 0.04 — ABNORMAL LOW (ref 0.26–1.65)
Lambda free light chains: 109 mg/L — ABNORMAL HIGH (ref 5.7–26.3)

## 2019-02-28 LAB — IGG, IGA, IGM
IgA: 15 mg/dL — ABNORMAL LOW (ref 87–352)
IgG (Immunoglobin G), Serum: 314 mg/dL — ABNORMAL LOW (ref 586–1602)
IgM (Immunoglobulin M), Srm: <5 mg/dL — ABNORMAL LOW (ref 26–217)

## 2019-02-28 LAB — IRON AND TIBC
Iron: 106 ug/dL (ref 41–142)
Saturation Ratios: 33 % (ref 21–57)
TIBC: 319 ug/dL (ref 236–444)
UIBC: 213 ug/dL (ref 120–384)

## 2019-02-28 LAB — FERRITIN: Ferritin: 2322 ng/mL — ABNORMAL HIGH (ref 11–307)

## 2019-03-04 ENCOUNTER — Telehealth: Payer: Self-pay

## 2019-03-04 ENCOUNTER — Other Ambulatory Visit: Payer: Self-pay | Admitting: Hematology & Oncology

## 2019-03-06 ENCOUNTER — Inpatient Hospital Stay: Payer: Medicare Other

## 2019-03-06 ENCOUNTER — Other Ambulatory Visit: Payer: Self-pay

## 2019-03-06 ENCOUNTER — Ambulatory Visit (INDEPENDENT_AMBULATORY_CARE_PROVIDER_SITE_OTHER): Payer: Medicare Other

## 2019-03-06 ENCOUNTER — Inpatient Hospital Stay: Payer: Medicare Other | Attending: Hematology & Oncology

## 2019-03-06 DIAGNOSIS — D509 Iron deficiency anemia, unspecified: Secondary | ICD-10-CM | POA: Diagnosis not present

## 2019-03-06 DIAGNOSIS — I10 Essential (primary) hypertension: Secondary | ICD-10-CM | POA: Diagnosis not present

## 2019-03-06 DIAGNOSIS — C9000 Multiple myeloma not having achieved remission: Secondary | ICD-10-CM

## 2019-03-06 DIAGNOSIS — C541 Malignant neoplasm of endometrium: Secondary | ICD-10-CM

## 2019-03-06 DIAGNOSIS — C9001 Multiple myeloma in remission: Secondary | ICD-10-CM

## 2019-03-06 DIAGNOSIS — D500 Iron deficiency anemia secondary to blood loss (chronic): Secondary | ICD-10-CM

## 2019-03-06 DIAGNOSIS — Z79899 Other long term (current) drug therapy: Secondary | ICD-10-CM | POA: Insufficient documentation

## 2019-03-06 DIAGNOSIS — Z5112 Encounter for antineoplastic immunotherapy: Secondary | ICD-10-CM | POA: Diagnosis not present

## 2019-03-06 DIAGNOSIS — R319 Hematuria, unspecified: Secondary | ICD-10-CM | POA: Insufficient documentation

## 2019-03-06 DIAGNOSIS — E66811 Obesity, class 1: Secondary | ICD-10-CM

## 2019-03-06 DIAGNOSIS — E6609 Other obesity due to excess calories: Secondary | ICD-10-CM

## 2019-03-06 DIAGNOSIS — Z6834 Body mass index (BMI) 34.0-34.9, adult: Secondary | ICD-10-CM

## 2019-03-06 LAB — CBC WITH DIFFERENTIAL (CANCER CENTER ONLY)
Abs Immature Granulocytes: 0.03 10*3/uL (ref 0.00–0.07)
Basophils Absolute: 0.1 10*3/uL (ref 0.0–0.1)
Basophils Relative: 3 %
Eosinophils Absolute: 0.1 10*3/uL (ref 0.0–0.5)
Eosinophils Relative: 2 %
HCT: 35.9 % — ABNORMAL LOW (ref 36.0–46.0)
Hemoglobin: 11.9 g/dL — ABNORMAL LOW (ref 12.0–15.0)
Immature Granulocytes: 1 %
Lymphocytes Relative: 19 %
Lymphs Abs: 0.9 10*3/uL (ref 0.7–4.0)
MCH: 27.8 pg (ref 26.0–34.0)
MCHC: 33.1 g/dL (ref 30.0–36.0)
MCV: 83.9 fL (ref 80.0–100.0)
Monocytes Absolute: 0.8 10*3/uL (ref 0.1–1.0)
Monocytes Relative: 15 %
Neutro Abs: 3 10*3/uL (ref 1.7–7.7)
Neutrophils Relative %: 60 %
Platelet Count: 266 10*3/uL (ref 150–400)
RBC: 4.28 MIL/uL (ref 3.87–5.11)
RDW: 16.8 % — ABNORMAL HIGH (ref 11.5–15.5)
WBC Count: 5 10*3/uL (ref 4.0–10.5)
nRBC: 0 % (ref 0.0–0.2)

## 2019-03-06 LAB — CMP (CANCER CENTER ONLY)
ALT: 13 U/L (ref 0–44)
AST: 10 U/L — ABNORMAL LOW (ref 15–41)
Albumin: 4.7 g/dL (ref 3.5–5.0)
Alkaline Phosphatase: 36 U/L — ABNORMAL LOW (ref 38–126)
Anion gap: 9 (ref 5–15)
BUN: 19 mg/dL (ref 8–23)
CO2: 27 mmol/L (ref 22–32)
Calcium: 10.3 mg/dL (ref 8.9–10.3)
Chloride: 104 mmol/L (ref 98–111)
Creatinine: 0.87 mg/dL (ref 0.44–1.00)
GFR, Est AFR Am: >60 mL/min (ref >60)
GFR, Estimated: >60 mL/min (ref >60)
Glucose, Bld: 97 mg/dL (ref 70–99)
Potassium: 3.7 mmol/L (ref 3.5–5.1)
Sodium: 140 mmol/L (ref 135–145)
Total Bilirubin: 0.4 mg/dL (ref 0.3–1.2)
Total Protein: 6.5 g/dL (ref 6.5–8.1)

## 2019-03-06 MED ORDER — ACETAMINOPHEN 325 MG PO TABS
ORAL_TABLET | ORAL | Status: AC
  Filled 2019-03-06: qty 2

## 2019-03-06 MED ORDER — DENOSUMAB 120 MG/1.7ML ~~LOC~~ SOLN
120.0000 mg | Freq: Once | SUBCUTANEOUS | Status: AC
  Administered 2019-03-06: 120 mg via SUBCUTANEOUS

## 2019-03-06 MED ORDER — ACETAMINOPHEN 325 MG PO TABS
650.0000 mg | ORAL_TABLET | Freq: Once | ORAL | Status: AC
  Administered 2019-03-06: 650 mg via ORAL

## 2019-03-06 MED ORDER — DIPHENHYDRAMINE HCL 25 MG PO CAPS
ORAL_CAPSULE | ORAL | Status: AC
  Filled 2019-03-06: qty 2

## 2019-03-06 MED ORDER — HEPARIN SOD (PORK) LOCK FLUSH 100 UNIT/ML IV SOLN
500.0000 [IU] | Freq: Once | INTRAVENOUS | Status: DC | PRN
  Filled 2019-03-06: qty 5

## 2019-03-06 MED ORDER — DIPHENHYDRAMINE HCL 25 MG PO CAPS
50.0000 mg | ORAL_CAPSULE | Freq: Once | ORAL | Status: AC
  Administered 2019-03-06: 50 mg via ORAL

## 2019-03-06 MED ORDER — DEXAMETHASONE 4 MG PO TABS
ORAL_TABLET | ORAL | Status: AC
  Filled 2019-03-06: qty 5

## 2019-03-06 MED ORDER — SODIUM CHLORIDE 0.9% FLUSH
10.0000 mL | INTRAVENOUS | Status: DC | PRN
  Filled 2019-03-06: qty 10

## 2019-03-06 MED ORDER — DARATUMUMAB-HYALURONIDASE-FIHJ 1800-30000 MG-UT/15ML ~~LOC~~ SOLN
1800.0000 mg | Freq: Once | SUBCUTANEOUS | Status: AC
  Administered 2019-03-06: 1800 mg via SUBCUTANEOUS
  Filled 2019-03-06: qty 15

## 2019-03-06 MED ORDER — DEXAMETHASONE 4 MG PO TABS
20.0000 mg | ORAL_TABLET | Freq: Once | ORAL | Status: AC
  Administered 2019-03-06: 20 mg via ORAL

## 2019-03-06 MED ORDER — DENOSUMAB 120 MG/1.7ML ~~LOC~~ SOLN
SUBCUTANEOUS | Status: AC
  Filled 2019-03-06: qty 1.7

## 2019-03-06 NOTE — Patient Instructions (Signed)

## 2019-03-06 NOTE — Patient Instructions (Signed)
[facility] Discharge Instructions for Patients Receiving Chemotherapy  Today you received the following chemotherapy agents Darzalex To help prevent nausea and vomiting after your treatment, we encourage you to take your nausea medication as prescribed.   If you develop nausea and vomiting that is not controlled by your nausea medication, call the clinic.   BELOW ARE SYMPTOMS THAT SHOULD BE REPORTED IMMEDIATELY:  *FEVER GREATER THAN 100.5 F  *CHILLS WITH OR WITHOUT FEVER  NAUSEA AND VOMITING THAT IS NOT CONTROLLED WITH YOUR NAUSEA MEDICATION  *UNUSUAL SHORTNESS OF BREATH  *UNUSUAL BRUISING OR BLEEDING  TENDERNESS IN MOUTH AND THROAT WITH OR WITHOUT PRESENCE OF ULCERS  *URINARY PROBLEMS  *BOWEL PROBLEMS  UNUSUAL RASH Items with * indicate a potential emergency and should be followed up as soon as possible.  Feel free to call the clinic should you have any questions or concerns. The clinic phone number is [phone].  Please show the [facility] at check-in to the Emergency Department and triage nurse.  Ferumoxytol injection (Feraheme) What is this medicine? FERUMOXYTOL is an iron complex. Iron is used to make healthy red blood cells, which carry oxygen and nutrients throughout the body. This medicine is used to treat iron deficiency anemia. This medicine may be used for other purposes; ask your health care provider or pharmacist if you have questions. COMMON BRAND NAME(S): Feraheme What should I tell my health care provider before I take this medicine? They need to know if you have any of these conditions:  anemia not caused by low iron levels  high levels of iron in the blood  magnetic resonance imaging (MRI) test scheduled  an unusual or allergic reaction to iron, other medicines, foods, dyes, or preservatives  pregnant or trying to get pregnant  breast-feeding How should I use this medicine? This medicine is for injection into a vein. It  is given by a health care professional in a hospital or clinic setting. Talk to your pediatrician regarding the use of this medicine in children. Special care may be needed. Overdosage: If you think you have taken too much of this medicine contact a poison control center or emergency room at once. NOTE: This medicine is only for you. Do not share this medicine with others. What if I miss a dose? It is important not to miss your dose. Call your doctor or health care professional if you are unable to keep an appointment. What may interact with this medicine? This medicine may interact with the following medications:  other iron products This list may not describe all possible interactions. Give your health care provider a list of all the medicines, herbs, non-prescription drugs, or dietary supplements you use. Also tell them if you smoke, drink alcohol, or use illegal drugs. Some items may interact with your medicine. What should I watch for while using this medicine? Visit your doctor or healthcare professional regularly. Tell your doctor or healthcare professional if your symptoms do not start to get better or if they get worse. You may need blood work done while you are taking this medicine. You may need to follow a special diet. Talk to your doctor. Foods that contain iron include: whole grains/cereals, dried fruits, beans, or peas, leafy green vegetables, and organ meats (liver, kidney). What side effects may I notice from receiving this medicine? Side effects that you should report to your doctor or health care professional as soon as possible:  allergic reactions like skin rash, itching or hives, swelling of  the face, lips, or tongue  breathing problems  changes in blood pressure  feeling faint or lightheaded, falls  fever or chills  flushing, sweating, or hot feelings  swelling of the ankles or feet Side effects that usually do not require medical attention (report to your doctor  or health care professional if they continue or are bothersome):  diarrhea  headache  nausea, vomiting  stomach pain This list may not describe all possible side effects. Call your doctor for medical advice about side effects. You may report side effects to FDA at 1-800-FDA-1088. Where should I keep my medicine? This drug is given in a hospital or clinic and will not be stored at home. NOTE: This sheet is a summary. It may not cover all possible information. If you have questions about this medicine, talk to your doctor, pharmacist, or health care provider.  2020 Elsevier/Gold Standard (2016-03-06 20:21:10)

## 2019-03-06 NOTE — Progress Notes (Signed)
Ok to treat with labs from 02/27/19 per Dr Marin Olp. dph

## 2019-03-07 ENCOUNTER — Telehealth: Payer: Medicare Other

## 2019-03-07 NOTE — Chronic Care Management (AMB) (Signed)
Chronic Care Management   Initial Visit Note  03/07/2019 Name: Emily Greer MRN: [id number] DOB: 1950-07-26  Referred by: [name], MD Reason for referral : Chronic Care Management (#3 Attempt new CCM RNCM Telephone Attempt)   Emily Greer is a 69 y.o. year old female who is a primary care patient of [name], MD. The CCM team was consulted for assistance with chronic disease management and care coordination needs related to HTN and Multiple Myeloma  Review of patient status, including review of consultants reports, relevant laboratory and other test results, and collaboration with appropriate care team members and the patient's provider was performed as part of comprehensive patient evaluation and provision of chronic care management services.    SDOH (Social Determinants of Health) screening performed today: None. See Care Plan for related entries.   Placed initial outbound call to patient to assess for CCM RN CM needs and a care plan was established.   Medications: Outpatient Encounter Medications as of 03/06/2019  Medication Sig  . acyclovir (ZOVIRAX) 400 MG tablet Take 1 tablet (400 mg total) by mouth 2 (two) times daily.  [name] Kitchen albuterol (PROVENTIL HFA;VENTOLIN HFA) 108 (90 Base) MCG/ACT inhaler Inhale 2 puffs into the lungs every 6 (six) hours as needed for wheezing or shortness of breath. 2 puffs 3 times daily x 5 days then every 6 hours as needed.  [name] Kitchen amLODipine (NORVASC) 10 MG tablet TAKE 1 TABLET BY MOUTH EVERY MORNING  . aspirin EC 81 MG tablet Take 81 mg by mouth 2 (two) times daily. Stopped  By dr Myna Hidalgo until urology procedures complete  . Calcium-Magnesium-Vitamin D (CALCIUM 1200+D3 PO) Take 1 tablet by mouth daily.  . Cholecalciferol (VITAMIN D3) 2000 UNITS TABS Take 2 tablets by mouth daily.   [name] Kitchen letrozole (FEMARA) 2.5 MG tablet Take 1 tablet (2.5 mg total) by mouth daily.  [name] Kitchen loperamide (IMODIUM) 2 MG capsule Take by mouth as needed for diarrhea or loose  stools. Reported on 08/19/2015  . loratadine (CLARITIN) 10 MG tablet Take 10 mg by mouth every morning. Reported on 08/19/2015  . LORazepam (ATIVAN) 0.5 MG tablet Take 1 tablet (0.5 mg total) by mouth every 6 (six) hours as needed (Nausea or vomiting).  . montelukast (SINGULAIR) 10 MG tablet TAKE 1 TABLET(10 MG) BY MOUTH AT BEDTIME  . pomalidomide (POMALYST) 1 MG capsule TAKE 1 CAPSULE BY MOUTH ONCE DAILY FOR 21 DAYS ON AND 7 DAYS OFF  . potassium chloride SA (K-DUR) 20 MEQ tablet TAKE 1 TABLET(20 MEQ) BY MOUTH TWICE DAILY  . Probiotic Product (PROBIOTIC DAILY PO) Take by mouth daily.  [name] Kitchen triamcinolone (NASACORT ALLERGY 24HR) 55 MCG/ACT AERO nasal inhaler Place 2 sprays into the nose as needed.   . triamterene-hydrochlorothiazide (MAXZIDE-25) 37.5-25 MG tablet 1 tablet by mouth at bedtime   Facility-Administered Encounter Medications as of 03/06/2019  Medication  . sodium chloride flush (NS) 0.9 % injection 10 mL     Objective:  Lab Results  Component Value Date   HGBA1C 5.7 (H) 06/01/2014   HGBA1C 5.9 03/22/2007   Lab Results  Component Value Date   LDLCALC 164 (H) 03/22/2007   CREATININE 0.87 03/06/2019   BP Readings from Last 3 Encounters:  03/06/19 (!) 157/84  02/27/19 140/66  01/30/19 139/66    Goals Addressed      Patient Stated   . "My Cancer treatment is going well" (pt-stated)       Current Barriers:  [name] Kitchen Knowledge Deficits related to Multiple Myeloma . Chronic  Disease Management support and education needs related to Multiple Myeloma; HTN, Class 1 Obesity   Nurse Case Manager Clinical Goal(s):  [name] Kitchen Over the next 90 days, patient will work with the CCM team to address needs related to disease management and support for Multiple Myeloma  Interventions:  . Evaluation of current treatment plan related to Multiple Myeloma and patient's adherence to plan as established by provider. . Reviewed medications with patient and discussed her current treatment plan for Cancer  treatment, including, Pomalidomide (Pomalyst) 1 mg capsule qd for 21 days on and 7 days off; Letrozole (Femara) 2.5 mg qd; Assessed for financial hardship paying for medications; patient denies and is receiving a grant to pay for her Pomalyst; Assessed for potential SE and patient denies stating she receives supportive therapy to decrease SE during Chemo treatments; she is taking her antiemetics as prescribed with good effectiveness . Discussed plans with patient for ongoing care management follow up and provided patient with direct contact information for care management team . Determined patient's current Cancer plan includes;  DENOSUMAB (XGEVA) Q3MOS & FERUMOXYTOL (FERAHEME) Q7D & PORTACATH FLUSH Q6WK . Assessed for hydration and nutrition status; patient is able to tolerate a regular diet and feels she is staying well hydrated . Encouraged patient to balance her activity with rest and to notify her Oncology team of any status change promptly  Patient Self Care Activities:  . Self administers medications as prescribed . Attends all scheduled provider appointments . Calls pharmacy for medication refills . Performs ADL's independently . Performs IADL's independently . Calls provider office for new concerns or questions  Initial goal documentation     . "to keep my BP under good control" (pt-stated)       Current Barriers:  [name] Kitchen Knowledge Deficits related to disease process and Self health management of Hypertension . Chronic Disease Management support and education needs related to HTN, Multiple Myeloma, Class 1 Obesity  Nurse Case Manager Clinical Goal(s):  [name] Kitchen Over the next 90 days, patient will work with the CCM RN CM and PCP to address needs related to Hypertension  Interventions:  . Evaluation of current treatment plan related to Hypertension and patient's adherence to plan as established by provider. . Provided education to patient re: target BP <130/80; Reviewed and discussed patient's  most recent BP readings; Determined patient has a BP cuff at home and feels comfortable Self monitoring her BP; her spouse is also assisting with monitoring her BP if needed . Reviewed medications with patient and discussed indication, dosage and frequency of prescribed antihypertensive medications; patient reports having a good understanding of her regimen and reports here adherence  . Discussed plans with patient for ongoing care management follow up and provided patient with direct contact information for care management team . Advised patient, providing education and rationale, to monitor blood pressure daily and record, calling her PCP and or Cardiologist for findings outside established parameters.  . Provided patient with printed educational materials related to Why Should I Restrict Sodium?; Life's Simple 7; What About African Americans and High Blood Pressure  Patient Self Care Activities:  . Self administers medications as prescribed . Attends all scheduled provider appointments . Calls pharmacy for medication refills . Performs ADL's independently . Performs IADL's independently . Calls provider office for new concerns or questions  Initial goal documentation        Plan:   Telephone follow up appointment with care management team member scheduled for:  04/10/19  Delsa Sale, RN, BSN, CCM  Care Management Coordinator [facility] Care Management/[facility]  Direct Phone: [phone]

## 2019-03-07 NOTE — Patient Instructions (Addendum)
Visit Information  Goals Addressed      Patient Stated   . "My Cancer treatment is going well" (pt-stated)       Current Barriers:  [name] Kitchen Knowledge Deficits related to Multiple Myeloma . Chronic Disease Management support and education needs related to Multiple Myeloma; HTN , Class 1 Obesity   Nurse Case Manager Clinical Goal(s):  [name] Kitchen Over the next 90 days, patient will work with the CCM team to address needs related to disease management and support for Multiple Myeloma  Interventions:  . Evaluation of current treatment plan related to Multiple Myeloma and patient's adherence to plan as established by provider. . Reviewed medications with patient and discussed her current treatment plan for Cancer treatment, including, Pomalidomide (Pomalyst) 1 mg capsule qd for 21 days on and 7 days off; Letrozole (Femara) 2.5 mg qd; Assessed for financial hardship paying for medications; patient denies and is receiving a grant to pay for her Pomalyst; Assessed for potential SE and patient denies stating she receives supportive therapy to decrease SE during Chemo treatments; she is taking her antiemetics as prescribed with good effectiveness . Discussed plans with patient for ongoing care management follow up and provided patient with direct contact information for care management team . Determined patient's current Cancer plan includes;  DENOSUMAB (XGEVA) Q3MOS & FERUMOXYTOL (FERAHEME) Q7D & PORTACATH FLUSH Q6WK . Assessed for hydration and nutrition status; patient is able to tolerate a regular diet and feels she is staying well hydrated . Encouraged patient to balance her activity with rest and to notify her Oncology team of any status change promptly  Patient Self Care Activities:  . Self administers medications as prescribed . Attends all scheduled provider appointments . Calls pharmacy for medication refills . Performs ADL's independently . Performs IADL's independently . Calls provider office for new  concerns or questions  Initial goal documentation     . "to keep my BP under good control" (pt-stated)       Current Barriers:  [name] Kitchen Knowledge Deficits related to disease process and Self health management of Hypertension . Chronic Disease Management support and education needs related to HTN, Multiple Myeloma, Class 1 Obesity  Nurse Case Manager Clinical Goal(s):  [name] Kitchen Over the next 90 days, patient will work with the [facility] CM and PCP to address needs related to Hypertension  Interventions:  . Evaluation of current treatment plan related to Hypertension and patient's adherence to plan as established by provider. . Provided education to patient re: target BP <130/80; Reviewed and discussed patient's most recent BP readings; Determined patient has a BP cuff at home and feels comfortable Self monitoring her BP; her spouse is also assisting with monitoring her BP if needed . Reviewed medications with patient and discussed indication, dosage and frequency of prescribed antihypertensive medications; patient reports having a good understanding of her regimen and reports here adherence  . Discussed plans with patient for ongoing care management follow up and provided patient with direct contact information for care management team . Advised patient, providing education and rationale, to monitor blood pressure daily and record, calling her PCP and or Cardiologist for findings outside established parameters.  . Provided patient with printed educational materials related to Why Should I Restrict Sodium?; Life's Simple 7; What About African Americans and High Blood Pressure  Patient Self Care Activities:  . Self administers medications as prescribed . Attends all scheduled provider appointments . Calls pharmacy for medication refills . Performs ADL's independently . Performs IADL's independently . Calls  provider office for new concerns or questions  Initial goal documentation        The patient  verbalized understanding of instructions provided today and declined a print copy of patient instruction materials.   Telephone follow up appointment with care management team member scheduled for: 04/10/19  Barb Merino, RN, BSN, CCM Care Management Coordinator [facility]  Direct Phone: [phone]

## 2019-03-08 LAB — UPEP/UIFE/LIGHT CHAINS/TP, 24-HR UR
% BETA, Urine: 20.3 %
ALPHA 1 URINE: 7.6 %
Albumin, U: 24.3 %
Alpha 2, Urine: 10 %
Free Kappa Lt Chains,Ur: 3.8 mg/L (ref 0.63–113.79)
Free Kappa/Lambda Ratio: 0.04 — ABNORMAL LOW (ref 1.03–31.76)
Free Lambda Lt Chains,Ur: 100.87 mg/L — ABNORMAL HIGH (ref 0.47–11.77)
GAMMA GLOBULIN URINE: 37.8 %
M-SPIKE %, Urine: 17.3 % — ABNORMAL HIGH
M-Spike, Mg/24 Hr: 11 mg/24 hr — ABNORMAL HIGH
Total Protein, Urine-Ur/day: 63 mg/24 hr (ref 30–150)
Total Protein, Urine: 6.6 mg/dL
Total Volume: 950

## 2019-03-12 ENCOUNTER — Other Ambulatory Visit: Payer: Self-pay | Admitting: *Deleted

## 2019-03-12 DIAGNOSIS — C9000 Multiple myeloma not having achieved remission: Secondary | ICD-10-CM

## 2019-03-12 MED ORDER — POMALIDOMIDE 1 MG PO CAPS: ORAL_CAPSULE | ORAL | 0 refills | Status: DC

## 2019-03-18 ENCOUNTER — Other Ambulatory Visit: Payer: Self-pay | Admitting: Hematology & Oncology

## 2019-03-18 DIAGNOSIS — C9001 Multiple myeloma in remission: Secondary | ICD-10-CM

## 2019-03-20 ENCOUNTER — Telehealth: Payer: Self-pay

## 2019-03-21 ENCOUNTER — Ambulatory Visit: Payer: Self-pay

## 2019-03-21 DIAGNOSIS — C9001 Multiple myeloma in remission: Secondary | ICD-10-CM

## 2019-03-21 NOTE — Chronic Care Management (AMB) (Signed)
Chronic Care Management   Social Work Note  03/21/2019 Name: Emily Greer MRN: [id number] DOB: 09-13-1950  Emily Greer is a 69 y.o. year old female who sees [name], MD for primary care. The CCM team was consulted for assistance with care coordination.   SW placed an outbound call to the patient in response to a voice message received requesting assistance with medication refills. Determined the patient is wanting to refill medication prescribed by her oncologist at [facility]. SW advised the patient to contact her pharmacy and request a refill. The patient stated understanding.     Outpatient Encounter Medications as of 03/21/2019  Medication Sig  . acyclovir (ZOVIRAX) 400 MG tablet TAKE 1 TABLET(400 MG) BY MOUTH TWICE DAILY  . albuterol (PROVENTIL HFA;VENTOLIN HFA) 108 (90 Base) MCG/ACT inhaler Inhale 2 puffs into the lungs every 6 (six) hours as needed for wheezing or shortness of breath. 2 puffs 3 times daily x 5 days then every 6 hours as needed.  [name] Kitchen amLODipine (NORVASC) 10 MG tablet TAKE 1 TABLET BY MOUTH EVERY MORNING  . aspirin EC 81 MG tablet Take 81 mg by mouth 2 (two) times daily. Stopped  By dr Myna Hidalgo until urology procedures complete  . Calcium-Magnesium-Vitamin D (CALCIUM 1200+D3 PO) Take 1 tablet by mouth daily.  . Cholecalciferol (VITAMIN D3) 2000 UNITS TABS Take 2 tablets by mouth daily.   [name] Kitchen letrozole (FEMARA) 2.5 MG tablet Take 1 tablet (2.5 mg total) by mouth daily.  [name] Kitchen loperamide (IMODIUM) 2 MG capsule Take by mouth as needed for diarrhea or loose stools. Reported on 08/19/2015  . loratadine (CLARITIN) 10 MG tablet Take 10 mg by mouth every morning. Reported on 08/19/2015  . LORazepam (ATIVAN) 0.5 MG tablet Take 1 tablet (0.5 mg total) by mouth every 6 (six) hours as needed (Nausea or vomiting).  . montelukast (SINGULAIR) 10 MG tablet TAKE 1 TABLET(10 MG) BY MOUTH AT BEDTIME  . pomalidomide (POMALYST) 1 MG capsule TAKE 1 CAPSULE BY MOUTH ONCE DAILY FOR 21 DAYS ON  AND 7 DAYS OFF  . potassium chloride SA (K-DUR) 20 MEQ tablet TAKE 1 TABLET(20 MEQ) BY MOUTH TWICE DAILY  . Probiotic Product (PROBIOTIC DAILY PO) Take by mouth daily.  [name] Kitchen triamcinolone (NASACORT ALLERGY 24HR) 55 MCG/ACT AERO nasal inhaler Place 2 sprays into the nose as needed.   . triamterene-hydrochlorothiazide (MAXZIDE-25) 37.5-25 MG tablet 1 tablet by mouth at bedtime   Facility-Administered Encounter Medications as of 03/21/2019  Medication  . sodium chloride flush (NS) 0.9 % injection 10 mL     Follow Up Plan: No SW follow up planned at this time. The patient is encouraged to contact SW if needed.  Bevelyn Ngo, BSW, CDP Social Worker, Certified Dementia Practitioner [facility] / [facility] Care Management [phone]

## 2019-03-31 ENCOUNTER — Ambulatory Visit (INDEPENDENT_AMBULATORY_CARE_PROVIDER_SITE_OTHER): Payer: Medicare Other | Admitting: Internal Medicine

## 2019-03-31 ENCOUNTER — Other Ambulatory Visit: Payer: Self-pay

## 2019-03-31 ENCOUNTER — Encounter: Payer: Self-pay | Admitting: Internal Medicine

## 2019-03-31 VITALS — BP 122/76 | HR 74 | Temp 98.4°F | Ht 64.0 in | Wt 196.0 lb

## 2019-03-31 DIAGNOSIS — E6609 Other obesity due to excess calories: Secondary | ICD-10-CM

## 2019-03-31 DIAGNOSIS — I10 Essential (primary) hypertension: Secondary | ICD-10-CM | POA: Diagnosis not present

## 2019-03-31 DIAGNOSIS — E041 Nontoxic single thyroid nodule: Secondary | ICD-10-CM

## 2019-03-31 DIAGNOSIS — M879 Osteonecrosis, unspecified: Secondary | ICD-10-CM | POA: Diagnosis not present

## 2019-03-31 DIAGNOSIS — Z6833 Body mass index (BMI) 33.0-33.9, adult: Secondary | ICD-10-CM

## 2019-03-31 DIAGNOSIS — Z9484 Stem cells transplant status: Secondary | ICD-10-CM

## 2019-03-31 DIAGNOSIS — C9000 Multiple myeloma not having achieved remission: Secondary | ICD-10-CM | POA: Diagnosis not present

## 2019-03-31 MED ORDER — TRIAMTERENE-HCTZ 37.5-25 MG PO TABS: ORAL_TABLET | ORAL | 2 refills | Status: DC

## 2019-03-31 MED ORDER — ALBUTEROL SULFATE HFA 108 (90 BASE) MCG/ACT IN AERS: 2.0000 | INHALATION_SPRAY | Freq: Four times a day (QID) | RESPIRATORY_TRACT | 11 refills | Status: DC | PRN

## 2019-03-31 MED ORDER — MONTELUKAST SODIUM 10 MG PO TABS: ORAL_TABLET | ORAL | 2 refills | Status: DC

## 2019-03-31 MED ORDER — AMLODIPINE BESYLATE 10 MG PO TABS: 10.0000 mg | ORAL_TABLET | Freq: Every morning | ORAL | 2 refills | Status: DC

## 2019-03-31 NOTE — Progress Notes (Signed)
This visit occurred during the SARS-CoV-2 public health emergency.  Safety protocols were in place, including screening questions prior to the visit, additional usage of staff PPE, and extensive cleaning of exam room while observing appropriate contact time as indicated for disinfecting solutions.  Subjective:     Patient ID: Emily Greer , female    DOB: [birth year] , 69 y.o.   MRN: [id number]   Chief Complaint  Patient presents with  . Hypertension    HPI  She presents today for BP check. She reports compliance with meds.   Hypertension This is a chronic problem. The current episode started more than 1 year ago. The problem has been gradually improving since onset. The problem is controlled. Pertinent negatives include no blurred vision, chest pain, palpitations or shortness of breath.     Past Medical History:  Diagnosis Date  . Avascular necrosis of femoral head (HCC)    bilateral per CT 07-26-2015  . Endometrial carcinoma [facility]) gyn oncologist-  dr Nelly Rout ([facility])/  radiation oncologist-- dr Roselind Messier   2013 dx  FIGO Stage 1A, Grade 2 endometrioid endometrial cancer s/p TAH w/ BSO and bilateral pelvic node dissection 10-31-2011 ;  recurrence at distal vagina 04/ 2014 s/p  brachytherapy (ended 07-29-2012);  2nd recurrence 12/ 2016  vaginal apex s/p  conformational radiotherapy 03-10-2015 to 04-20-2015  . Family history of adverse reaction to anesthesia    mother ponv  . GERD (gastroesophageal reflux disease)   . H/O stem cell transplant (HCC)    02/ 2000 and second one 06/ 2016  . 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 History of chemotherapy    last chemo 12-26-17  . History of radiation therapy 6/4, 6/11, 6/19, 6/25, 07/29/2012   vagina 30.5 gray in 5 fx, HDR brachytherapy:   last radiation to vagina 03-10-2015 to 04-20-2015  50.4gray  . History of radiation therapy 03/10/15-04/20/15   vagina 50.4 in 28 fractions  .  Hypertension   . Lambda light chain myeloma [facility]) oncologist-  dr Myna Hidalgo ([facility])  and  [facility] -- dr Silvestre Mesi gasparetto   dx 07/ 1999 s/p  VAD chemotherapy 11/ 1999,  purged autotransplant 03-21-1998 followed by auto stem cell transplant 03-29-1998;  recurrance w/ second autologous stem cell transplant 07-24-2014;  in Re-mission currently , chemo maintenance therapy  . Osteoporosis 12/18/05   Increased  risk   . PONV (postoperative nausea and vomiting)   . Wears glasses      Family History  Problem Relation Age of Onset  . Colon cancer Mother   . Hypertension Father   . Heart Problems Father      Current Outpatient Medications:  .  acyclovir (ZOVIRAX) 400 MG tablet, TAKE 1 TABLET(400 MG) BY MOUTH TWICE DAILY, Disp: 60 tablet, Rfl: 11 .  albuterol (VENTOLIN HFA) 108 (90 Base) MCG/ACT inhaler, Inhale 2 puffs into the lungs every 6 (six) hours as needed for wheezing or shortness of breath. 2 puffs 3 times daily x 5 days then every 6 hours as needed., Disp: 18 g, Rfl: 11 .  amLODipine (NORVASC) 10 MG tablet, Take 1 tablet (10 mg total) by mouth every morning., Disp: 90 tablet, Rfl: 2 .  aspirin EC 81 MG tablet, Take 81 mg by mouth 2 (two) times daily. Stopped  By dr Myna Hidalgo until urology procedures complete, Disp: , Rfl:  .  Calcium-Magnesium-Vitamin D (CALCIUM 1200+D3 PO), Take 1 tablet by mouth daily., Disp: ,  Rfl:  .  Cholecalciferol (VITAMIN D3) 2000 UNITS TABS, Take 2 tablets by mouth daily. , Disp: , Rfl:  .  daratumumab (DARZALEX) 400 MG/20ML, Inject into the vein., Disp: , Rfl:  .  letrozole (FEMARA) 2.5 MG tablet, Take 1 tablet (2.5 mg total) by mouth daily., Disp: 60 tablet, Rfl: 4 .  loperamide (IMODIUM) 2 MG capsule, Take by mouth as needed for diarrhea or loose stools. Reported on 08/19/2015, Disp: , Rfl:  .  loratadine (CLARITIN) 10 MG tablet, Take 10 mg by mouth every morning. Reported on 08/19/2015, Disp: , Rfl:  .  LORazepam (ATIVAN) 0.5 MG tablet, Take 1 tablet  (0.5 mg total) by mouth every 6 (six) hours as needed (Nausea or vomiting)., Disp: 30 tablet, Rfl: 0 .  montelukast (SINGULAIR) 10 MG tablet, TAKE 1 TABLET(10 MG) BY MOUTH AT BEDTIME, Disp: 90 tablet, Rfl: 2 .  pomalidomide (POMALYST) 1 MG capsule, TAKE 1 CAPSULE BY MOUTH ONCE DAILY FOR 21 DAYS ON AND 7 DAYS OFF, Disp: 21 capsule, Rfl: 0 .  potassium chloride SA (K-DUR) 20 MEQ tablet, TAKE 1 TABLET(20 MEQ) BY MOUTH TWICE DAILY, Disp: 60 tablet, Rfl: 3 .  triamcinolone (NASACORT ALLERGY 24HR) 55 MCG/ACT AERO nasal inhaler, Place 2 sprays into the nose as needed. , Disp: , Rfl:  .  triamterene-hydrochlorothiazide (MAXZIDE-25) 37.5-25 MG tablet, 1 tablet by mouth at bedtime, Disp: 90 tablet, Rfl: 2 .  Probiotic Product (PROBIOTIC DAILY PO), Take by mouth daily., Disp: , Rfl:  No current facility-administered medications for this visit.  Facility-Administered Medications Ordered in Other Visits:  .  sodium chloride flush (NS) 0.9 % injection 10 mL, 10 mL, Intravenous, PRN, [place], Sarah M, NP, 10 mL at 12/26/17 1130   Allergies  Allergen Reactions  . Codeine Nausea Only     Review of Systems  Constitutional: Negative.   Eyes: Negative for blurred vision.  Respiratory: Negative.  Negative for shortness of breath.   Cardiovascular: Negative.  Negative for chest pain and palpitations.  Gastrointestinal: Negative.   Neurological: Negative.   Psychiatric/Behavioral: Negative.      Today's Vitals   03/31/19 1130  BP: 122/76  Pulse: 74  Temp: 98.4 F (36.9 C)  TempSrc: Oral  Weight: 196 lb (88.9 kg)  Height: 5\' 4"  (1.626 m)  PainSc: 0-No pain   Body mass index is 33.64 kg/m.   Objective:  Physical Exam Vitals and nursing note reviewed.  Constitutional:      Appearance: Normal appearance.  HENT:     Head: Normocephalic and atraumatic.  Cardiovascular:     Rate and Rhythm: Normal rate and regular rhythm.     Heart sounds: Normal heart sounds.  Pulmonary:     Effort:  Pulmonary effort is normal.     Breath sounds: Normal breath sounds.  Skin:    General: Skin is warm.  Neurological:     General: No focal deficit present.     Mental Status: She is alert.  Psychiatric:        Mood and Affect: Mood normal.        Behavior: Behavior normal.         Assessment And Plan:     1. Essential (primary) hypertension  Chronic, well controlled. She will continue with current meds. She is encouraged to avoid adding salt to her foods. She will rto in six months for re-evaluation.   - amLODipine (NORVASC) 10 MG tablet; Take 1 tablet (10 mg total) by mouth every morning.  Dispense:  90 tablet; Refill: 2  2. Thyroid nodule  I will check thyroid panel. I will also refer her for thyroid ultrasound.   - TSH; Future - T3, free; Future - T4, free; Future  3. Osteonecrosis, unspecified (HCC)  Chronic, previous 2017 CT results reviewed with patient.   4. Stem cells transplant status (HCC)   5. Class 1 obesity due to excess calories with serious comorbidity and body mass index (BMI) of 33.0 to 33.9 in adult  She is encouraged to strive for BMI less than 30 to decrease cardiac risk.  She is encouraged to incorporate more activity into her daily routine.   6. Multiple myeloma not having achieved remission (HCC)  Chronic, as per [facility].     Gwynneth Aliment, MD    THE PATIENT IS ENCOURAGED TO PRACTICE SOCIAL DISTANCING DUE TO THE COVID-19 PANDEMIC.

## 2019-03-31 NOTE — Patient Instructions (Signed)
Exercising to Stay Healthy To become healthy and stay healthy, it is recommended that you do moderate-intensity and vigorous-intensity exercise. You can tell that you are exercising at a moderate intensity if your heart starts beating faster and you start breathing faster but can still hold a conversation. You can tell that you are exercising at a vigorous intensity if you are breathing much harder and faster and cannot hold a conversation while exercising. Exercising regularly is important. It has many health benefits, such as:  Improving overall fitness, flexibility, and endurance.  Increasing bone density.  Helping with weight control.  Decreasing body fat.  Increasing muscle strength.  Reducing stress and tension.  Improving overall health. How often should I exercise? Choose an activity that you enjoy, and set realistic goals. Your health care provider can help you make an activity plan that works for you. Exercise regularly as told by your health care provider. This may include:  Doing strength training two times a week, such as: ? Lifting weights. ? Using resistance bands. ? Push-ups. ? Sit-ups. ? Yoga.  Doing a certain intensity of exercise for a given amount of time. Choose from these options: ? A total of 150 minutes of moderate-intensity exercise every week. ? A total of 75 minutes of vigorous-intensity exercise every week. ? A mix of moderate-intensity and vigorous-intensity exercise every week. Children, pregnant women, people who have not exercised regularly, people who are overweight, and older adults may need to talk with a health care provider about what activities are safe to do. If you have a medical condition, be sure to talk with your health care provider before you start a new exercise program. What are some exercise ideas? Moderate-intensity exercise ideas include:  Walking 1 mile (1.6 km) in about 15  minutes.  Biking.  Hiking.  Golfing.  Dancing.  Water aerobics. Vigorous-intensity exercise ideas include:  Walking 4.5 miles (7.2 km) or more in about 1 hour.  Jogging or running 5 miles (8 km) in about 1 hour.  Biking 10 miles (16.1 km) or more in about 1 hour.  Lap swimming.  Roller-skating or in-line skating.  Cross-country skiing.  Vigorous competitive sports, such as football, basketball, and soccer.  Jumping rope.  Aerobic dancing. What are some everyday activities that can help me to get exercise?  Yard work, such as: ? Pushing a lawn mower. ? Raking and bagging leaves.  Washing your car.  Pushing a stroller.  Shoveling snow.  Gardening.  Washing windows or floors. How can I be more active in my day-to-day activities?  Use stairs instead of an elevator.  Take a walk during your lunch break.  If you drive, park your car farther away from your work or school.  If you take public transportation, get off one stop early and walk the rest of the way.  Stand up or walk around during all of your indoor phone calls.  Get up, stretch, and walk around every 30 minutes throughout the day.  Enjoy exercise with a friend. Support to continue exercising will help you keep a regular routine of activity. What guidelines can I follow while exercising?  Before you start a new exercise program, talk with your health care provider.  Do not exercise so much that you hurt yourself, feel dizzy, or get very short of breath.  Wear comfortable clothes and wear shoes with good support.  Drink plenty of water while you exercise to prevent dehydration or heat stroke.  Work out until your breathing   and your heartbeat get faster. Where to find more information  [place] Department of Health and Human Services: www.hhs.gov  Centers for Disease Control and Prevention (CDC): www.cdc.gov Summary  Exercising regularly is important. It will improve your overall fitness,  flexibility, and endurance.  Regular exercise also will improve your overall health. It can help you control your weight, reduce stress, and improve your bone density.  Do not exercise so much that you hurt yourself, feel dizzy, or get very short of breath.  Before you start a new exercise program, talk with your health care provider. This information is not intended to replace advice given to you by your health care provider. Make sure you discuss any questions you have with your health care provider. Document Revised: 12/29/2016 Document Reviewed: 12/07/2016 Elsevier Patient Education  2020 Elsevier Inc.  

## 2019-04-03 ENCOUNTER — Inpatient Hospital Stay: Payer: Medicare Other

## 2019-04-03 ENCOUNTER — Other Ambulatory Visit: Payer: Self-pay | Admitting: *Deleted

## 2019-04-03 ENCOUNTER — Telehealth: Payer: Self-pay | Admitting: *Deleted

## 2019-04-03 ENCOUNTER — Inpatient Hospital Stay: Payer: Medicare Other | Attending: Hematology & Oncology

## 2019-04-03 ENCOUNTER — Inpatient Hospital Stay (HOSPITAL_BASED_OUTPATIENT_CLINIC_OR_DEPARTMENT_OTHER): Payer: Medicare Other | Admitting: Hematology & Oncology

## 2019-04-03 ENCOUNTER — Encounter: Payer: Self-pay | Admitting: Hematology & Oncology

## 2019-04-03 ENCOUNTER — Other Ambulatory Visit: Payer: Self-pay

## 2019-04-03 VITALS — HR 72

## 2019-04-03 VITALS — BP 135/75 | HR 110 | Temp 96.4°F | Resp 17 | Ht 64.0 in | Wt 197.0 lb

## 2019-04-03 DIAGNOSIS — R319 Hematuria, unspecified: Secondary | ICD-10-CM | POA: Diagnosis not present

## 2019-04-03 DIAGNOSIS — C541 Malignant neoplasm of endometrium: Secondary | ICD-10-CM | POA: Insufficient documentation

## 2019-04-03 DIAGNOSIS — D500 Iron deficiency anemia secondary to blood loss (chronic): Secondary | ICD-10-CM | POA: Diagnosis not present

## 2019-04-03 DIAGNOSIS — C9001 Multiple myeloma in remission: Secondary | ICD-10-CM

## 2019-04-03 DIAGNOSIS — E038 Other specified hypothyroidism: Secondary | ICD-10-CM

## 2019-04-03 DIAGNOSIS — Z5112 Encounter for antineoplastic immunotherapy: Secondary | ICD-10-CM | POA: Diagnosis not present

## 2019-04-03 DIAGNOSIS — Z95828 Presence of other vascular implants and grafts: Secondary | ICD-10-CM

## 2019-04-03 DIAGNOSIS — C9000 Multiple myeloma not having achieved remission: Secondary | ICD-10-CM | POA: Insufficient documentation

## 2019-04-03 DIAGNOSIS — Z79899 Other long term (current) drug therapy: Secondary | ICD-10-CM | POA: Insufficient documentation

## 2019-04-03 DIAGNOSIS — Z79811 Long term (current) use of aromatase inhibitors: Secondary | ICD-10-CM | POA: Insufficient documentation

## 2019-04-03 LAB — CBC WITH DIFFERENTIAL (CANCER CENTER ONLY)
Abs Immature Granulocytes: 0.03 10*3/uL (ref 0.00–0.07)
Basophils Absolute: 0.1 10*3/uL (ref 0.0–0.1)
Basophils Relative: 2 %
Eosinophils Absolute: 0.1 10*3/uL (ref 0.0–0.5)
Eosinophils Relative: 2 %
HCT: 36.8 % (ref 36.0–46.0)
Hemoglobin: 12.2 g/dL (ref 12.0–15.0)
Immature Granulocytes: 1 %
Lymphocytes Relative: 8 %
Lymphs Abs: 0.5 10*3/uL — ABNORMAL LOW (ref 0.7–4.0)
MCH: 27.9 pg (ref 26.0–34.0)
MCHC: 33.2 g/dL (ref 30.0–36.0)
MCV: 84.2 fL (ref 80.0–100.0)
Monocytes Absolute: 1.2 10*3/uL — ABNORMAL HIGH (ref 0.1–1.0)
Monocytes Relative: 20 %
Neutro Abs: 4.1 10*3/uL (ref 1.7–7.7)
Neutrophils Relative %: 67 %
Platelet Count: 271 10*3/uL (ref 150–400)
RBC: 4.37 MIL/uL (ref 3.87–5.11)
RDW: 15.7 % — ABNORMAL HIGH (ref 11.5–15.5)
WBC Count: 6 10*3/uL (ref 4.0–10.5)
nRBC: 0 % (ref 0.0–0.2)

## 2019-04-03 LAB — FERRITIN: Ferritin: 1535 ng/mL — ABNORMAL HIGH (ref 11–307)

## 2019-04-03 LAB — CMP (CANCER CENTER ONLY)
ALT: 17 U/L (ref 0–44)
AST: 12 U/L — ABNORMAL LOW (ref 15–41)
Albumin: 4.8 g/dL (ref 3.5–5.0)
Alkaline Phosphatase: 39 U/L (ref 38–126)
Anion gap: 11 (ref 5–15)
BUN: 22 mg/dL (ref 8–23)
CO2: 25 mmol/L (ref 22–32)
Calcium: 10.5 mg/dL — ABNORMAL HIGH (ref 8.9–10.3)
Chloride: 104 mmol/L (ref 98–111)
Creatinine: 0.96 mg/dL (ref 0.44–1.00)
GFR, Est AFR Am: >60 mL/min (ref >60)
GFR, Estimated: >60 mL/min (ref >60)
Glucose, Bld: 110 mg/dL — ABNORMAL HIGH (ref 70–99)
Potassium: 4 mmol/L (ref 3.5–5.1)
Sodium: 140 mmol/L (ref 135–145)
Total Bilirubin: 0.4 mg/dL (ref 0.3–1.2)
Total Protein: 6.9 g/dL (ref 6.5–8.1)

## 2019-04-03 LAB — IRON AND TIBC
Iron: 74 ug/dL (ref 41–142)
Saturation Ratios: 20 % — ABNORMAL LOW (ref 21–57)
TIBC: 374 ug/dL (ref 236–444)
UIBC: 300 ug/dL (ref 120–384)

## 2019-04-03 LAB — TSH: TSH: 1.701 u[IU]/mL (ref 0.308–3.960)

## 2019-04-03 MED ORDER — DEXAMETHASONE 4 MG PO TABS
20.0000 mg | ORAL_TABLET | Freq: Once | ORAL | Status: AC
  Administered 2019-04-03: 20 mg via ORAL

## 2019-04-03 MED ORDER — DIPHENHYDRAMINE HCL 25 MG PO CAPS
ORAL_CAPSULE | ORAL | Status: AC
  Filled 2019-04-03: qty 2

## 2019-04-03 MED ORDER — DIPHENHYDRAMINE HCL 25 MG PO CAPS
50.0000 mg | ORAL_CAPSULE | Freq: Once | ORAL | Status: AC
  Administered 2019-04-03: 50 mg via ORAL

## 2019-04-03 MED ORDER — HEPARIN SOD (PORK) LOCK FLUSH 100 UNIT/ML IV SOLN
500.0000 [IU] | Freq: Once | INTRAVENOUS | Status: DC
  Filled 2019-04-03: qty 5

## 2019-04-03 MED ORDER — DARATUMUMAB-HYALURONIDASE-FIHJ 1800-30000 MG-UT/15ML ~~LOC~~ SOLN
1800.0000 mg | Freq: Once | SUBCUTANEOUS | Status: AC
  Administered 2019-04-03: 1800 mg via SUBCUTANEOUS
  Filled 2019-04-03: qty 15

## 2019-04-03 MED ORDER — ACETAMINOPHEN 325 MG PO TABS
650.0000 mg | ORAL_TABLET | Freq: Once | ORAL | Status: AC
  Administered 2019-04-03: 650 mg via ORAL

## 2019-04-03 MED ORDER — DEXAMETHASONE 4 MG PO TABS
ORAL_TABLET | ORAL | Status: AC
  Filled 2019-04-03: qty 5

## 2019-04-03 MED ORDER — POMALIDOMIDE 1 MG PO CAPS: ORAL_CAPSULE | ORAL | 0 refills | Status: DC

## 2019-04-03 MED ORDER — ACETAMINOPHEN 325 MG PO TABS
ORAL_TABLET | ORAL | Status: AC
  Filled 2019-04-03: qty 2

## 2019-04-03 MED ORDER — SODIUM CHLORIDE 0.9 % IV SOLN
Freq: Once | INTRAVENOUS | Status: DC
  Filled 2019-04-03: qty 250

## 2019-04-03 MED ORDER — HEPARIN SOD (PORK) LOCK FLUSH 100 UNIT/ML IV SOLN
500.0000 [IU] | Freq: Once | INTRAVENOUS | Status: AC | PRN
  Administered 2019-04-03: 500 [IU]
  Filled 2019-04-03: qty 5

## 2019-04-03 MED ORDER — SODIUM CHLORIDE 0.9% FLUSH
10.0000 mL | Freq: Once | INTRAVENOUS | Status: AC
  Administered 2019-04-03: 10 mL via INTRAVENOUS
  Filled 2019-04-03: qty 10

## 2019-04-03 MED ORDER — SODIUM CHLORIDE 0.9% FLUSH
10.0000 mL | INTRAVENOUS | Status: DC | PRN
  Administered 2019-04-03: 10 mL
  Filled 2019-04-03: qty 10

## 2019-04-03 NOTE — Progress Notes (Signed)
Hematology and Oncology Follow Up Visit  Emily Greer [id number] 12/03/1950 69 y.o. 04/03/2019   Principle Diagnosis:  Recurrent lambda light chain myeloma History of recurrent endometrial carcinoma Iron deficiency anemia-blood loss  PastTherapy: Status post second autologous stem cell transplant on 07/24/2014 Maintenance therapy with Pomalidomide/every 2 week Velcade - d/c'ed Xgeva 120 mg subcutaneous every 3months -next dose inJune 2021 Radiation therapy for endometrial recurrence - completed 04/20/2015 Pomalyst/Kyprolis 70mg /m2 IV q 2 weeks - s/p cycle #12 - held on 12/26/2017 for hematuria  Current Therapy:   Daratumumab/Pomalyst (1 mg) - status post cycle #13 Femara 2.5 mg po q day IV iron as indicated   Interim History:  Emily Greer is here today for follow-up. Overall, she is doing pretty well. She really has had no specific complaints his last saw her.  I think her myeloma is holding pretty stable. Her last checked her light chain studies, her lambda light chain was 10.9 mg/dL. Back in December it was 10.8 mg/dL.  We did do a 24-hour urine on her in January. This showed a 24-hour lambda light chain excretion of 100 mg a day. Back in June 2020 it was 23 mg/day.  She has had no problems with bowels or bladder. She has had no cough or shortness of breath.. She is on pomalidomide at 1 mg a day. She is tolerating this pretty well.  She has had no problems with rashes. There is been no leg swelling. She has had no fever. There has been no cough or shortness of breath. She has had no headache. There is been no mouth sores.  Overall, her performance status is ECOG 0.  Medications:  Allergies as of 04/03/2019      Reactions   Codeine Nausea Only      Medication List       Accurate as of April 03, 2019  8:57 AM. If you have any questions, ask your nurse or doctor.        acyclovir 400 MG tablet Commonly known as: ZOVIRAX TAKE 1 TABLET(400 MG) BY  MOUTH TWICE DAILY   albuterol 108 (90 Base) MCG/ACT inhaler Commonly known as: VENTOLIN HFA Inhale 2 puffs into the lungs every 6 (six) hours as needed for wheezing or shortness of breath. 2 puffs 3 times daily x 5 days then every 6 hours as needed.   amLODipine 10 MG tablet Commonly known as: NORVASC Take 1 tablet (10 mg total) by mouth every morning.   aspirin EC 81 MG tablet Take 81 mg by mouth 2 (two) times daily. Stopped  By dr Emily Greer until urology procedures complete   CALCIUM 1200+D3 PO Take 1 tablet by mouth daily.   daratumumab 400 MG/20ML Commonly known as: DARZALEX Inject into the vein.   letrozole 2.5 MG tablet Commonly known as: FEMARA Take 1 tablet (2.5 mg total) by mouth daily.   loperamide 2 MG capsule Commonly known as: IMODIUM Take by mouth as needed for diarrhea or loose stools. Reported on 08/19/2015   loratadine 10 MG tablet Commonly known as: CLARITIN Take 10 mg by mouth every morning. Reported on 08/19/2015   LORazepam 0.5 MG tablet Commonly known as: Ativan Take 1 tablet (0.5 mg total) by mouth every 6 (six) hours as needed (Nausea or vomiting).   montelukast 10 MG tablet Commonly known as: SINGULAIR TAKE 1 TABLET(10 MG) BY MOUTH AT BEDTIME   Nasacort Allergy 24HR 55 MCG/ACT Aero nasal inhaler Generic drug: triamcinolone Place 2 sprays into the nose as needed.  pomalidomide 1 MG capsule Commonly known as: Pomalyst TAKE 1 CAPSULE BY MOUTH ONCE DAILY FOR 21 DAYS ON AND 7 DAYS OFF   potassium chloride SA 20 MEQ tablet Commonly known as: KLOR-CON TAKE 1 TABLET(20 MEQ) BY MOUTH TWICE DAILY   PROBIOTIC DAILY PO Take by mouth daily.   triamterene-hydrochlorothiazide 37.5-25 MG tablet Commonly known as: MAXZIDE-25 1 tablet by mouth at bedtime   Vitamin D3 50 MCG (2000 UT) Tabs Take 2 tablets by mouth daily.       Allergies:  Allergies  Allergen Reactions  . Codeine Nausea Only    Past Medical History, Surgical history, Social  history, and Family History were reviewed and updated.  Review of Systems: Review of Systems  Constitutional: Negative.   HENT: Negative.   Eyes: Negative.   Cardiovascular: Negative.   Gastrointestinal: Negative.   Genitourinary: Positive for hematuria.  Musculoskeletal: Negative.   Skin: Negative.   Neurological: Negative.   Endo/Heme/Allergies: Negative.   Psychiatric/Behavioral: Negative.      Physical Exam:  pulse is 107 (abnormal).   Wt Readings from Last 3 Encounters:  04/03/19 197 lb 0.6 oz (89.4 kg)  03/31/19 196 lb (88.9 kg)  02/27/19 193 lb (87.5 kg)      Physical Activity: Inactive  . Days of Exercise per Week: 0 days  . Minutes of Exercise per Session: 0 min   Physical Exam Vitals reviewed.  HENT:     Head: Normocephalic and atraumatic.  Eyes:     Pupils: Pupils are equal, round, and reactive to light.  Cardiovascular:     Rate and Rhythm: Normal rate and regular rhythm.     Heart sounds: Normal heart sounds.  Pulmonary:     Effort: Pulmonary effort is normal.     Breath sounds: Normal breath sounds.  Abdominal:     General: Bowel sounds are normal.     Palpations: Abdomen is soft.  Musculoskeletal:        General: No tenderness or deformity. Normal range of motion.     Cervical back: Normal range of motion.  Lymphadenopathy:     Cervical: No cervical adenopathy.  Skin:    General: Skin is warm and dry.     Findings: No erythema or rash.  Neurological:     Mental Status: She is alert and oriented to person, place, and time.  Psychiatric:        Behavior: Behavior normal.        Thought Content: Thought content normal.        Judgment: Judgment normal.      Lab Results  Component Value Date   WBC 6.0 04/03/2019   HGB 12.2 04/03/2019   HCT 36.8 04/03/2019   MCV 84.2 04/03/2019   PLT 271 04/03/2019   Lab Results  Component Value Date   FERRITIN 2,322 (H) 02/27/2019   IRON 106 02/27/2019   TIBC 319 02/27/2019   UIBC 213 02/27/2019    IRONPCTSAT 33 02/27/2019   Lab Results  Component Value Date   RETICCTPCT 2.9 (H) 10/17/2017   RBC 4.37 04/03/2019   Lab Results  Component Value Date   KPAFRELGTCHN 4.6 02/27/2019   LAMBDASER 109.0 (H) 02/27/2019   KAPLAMBRATIO 0.04 (L) 03/06/2019   Lab Results  Component Value Date   IGGSERUM 314 (L) 02/27/2019   IGA 15 (L) 02/27/2019   IGMSERUM <5 (L) 02/27/2019   Lab Results  Component Value Date   TOTALPROTELP 5.8 (L) 02/27/2019   ALBUMINELP 3.6 02/27/2019  A1GS 0.2 02/27/2019   A2GS 0.8 02/27/2019   BETS 0.9 02/27/2019   BETA2SER 0.4 11/23/2014   GAMS 0.3 (L) 02/27/2019   MSPIKE Not Observed 02/27/2019   SPEI Comment 01/30/2019     Chemistry      Component Value Date/Time   NA 140 03/06/2019 0958   NA 141 01/10/2017 1115   NA 140 06/21/2016 0918   K 3.7 03/06/2019 0958   K 4.0 01/10/2017 1115   K 4.3 06/21/2016 0918   CL 104 03/06/2019 0958   CL 106 01/10/2017 1115   CO2 27 03/06/2019 0958   CO2 27 01/10/2017 1115   CO2 20 (L) 06/21/2016 0918   BUN 19 03/06/2019 0958   BUN 15 01/10/2017 1115   BUN 15.8 06/21/2016 0918   CREATININE 0.87 03/06/2019 0958   CREATININE 1.0 01/10/2017 1115   CREATININE 0.8 06/21/2016 0918      Component Value Date/Time   CALCIUM 10.3 03/06/2019 0958   CALCIUM 9.5 01/10/2017 1115   CALCIUM 9.4 06/21/2016 0918   ALKPHOS 36 (L) 03/06/2019 0958   ALKPHOS 40 01/10/2017 1115   ALKPHOS 66 06/21/2016 0918   AST 10 (L) 03/06/2019 0958   AST 17 06/21/2016 0918   ALT 13 03/06/2019 0958   ALT 19 01/10/2017 1115   ALT 37 06/21/2016 0918   BILITOT 0.4 03/06/2019 0958   BILITOT 0.32 06/21/2016 0918       Impression and Plan: Ms. Kuzminski is a very pleasant 69 yo African American female with recurrent lambda light chain myeloma. Her second a stem cell transplant for light chain myeloma was in June 2016.   Shealsohaslocalized recurrent endometrial cancer (followed by gyn onc Dr. Nelly Rout).  She is on Femara for this.  I  think that myeloma is holding pretty steady.  We will see what her lambda light chain level is. We will have to watch her urine lambda light chain excretion. I probably would repeat another 24-hour urine in May.  Hopefully, everything is holding stable for her.  If we find that things are starting to progress, up I will consider her for Selinexor. I think this would be very reasonable to think about.   We will proceed with treatment today as planned and see her back in another 4 weeks.     Josph Macho, MD 3/4/20218:57 AM

## 2019-04-03 NOTE — Telephone Encounter (Addendum)
-----   Message from [name], MD sent at 04/03/2019  2:51 PM EST ----- Called patient to let her know that  the iron is actually low!!  We need 1 dose of iv IRON!!  Please set this up!!  appt set up for 04/09/2019 at 10am.  Patient aware.

## 2019-04-03 NOTE — Patient Instructions (Signed)
[facility] Discharge Instructions for Patients Receiving Chemotherapy  Today you received the following chemotherapy agents Darzalex To help prevent nausea and vomiting after your treatment, we encourage you to take your nausea medication as prescribed.   If you develop nausea and vomiting that is not controlled by your nausea medication, call the clinic.   BELOW ARE SYMPTOMS THAT SHOULD BE REPORTED IMMEDIATELY:  *FEVER GREATER THAN 100.5 F  *CHILLS WITH OR WITHOUT FEVER  NAUSEA AND VOMITING THAT IS NOT CONTROLLED WITH YOUR NAUSEA MEDICATION  *UNUSUAL SHORTNESS OF BREATH  *UNUSUAL BRUISING OR BLEEDING  TENDERNESS IN MOUTH AND THROAT WITH OR WITHOUT PRESENCE OF ULCERS  *URINARY PROBLEMS  *BOWEL PROBLEMS  UNUSUAL RASH Items with * indicate a potential emergency and should be followed up as soon as possible.  Feel free to call the clinic should you have any questions or concerns. The clinic phone number is [phone].  Please show the [facility] at check-in to the Emergency Department and triage nurse.  Ferumoxytol injection (Feraheme) What is this medicine? FERUMOXYTOL is an iron complex. Iron is used to make healthy red blood cells, which carry oxygen and nutrients throughout the body. This medicine is used to treat iron deficiency anemia. This medicine may be used for other purposes; ask your health care provider or pharmacist if you have questions. COMMON BRAND NAME(S): Feraheme What should I tell my health care provider before I take this medicine? They need to know if you have any of these conditions:  anemia not caused by low iron levels  high levels of iron in the blood  magnetic resonance imaging (MRI) test scheduled  an unusual or allergic reaction to iron, other medicines, foods, dyes, or preservatives  pregnant or trying to get pregnant  breast-feeding How should I use this medicine? This medicine is for injection into a vein. It  is given by a health care professional in a hospital or clinic setting. Talk to your pediatrician regarding the use of this medicine in children. Special care may be needed. Overdosage: If you think you have taken too much of this medicine contact a poison control center or emergency room at once. NOTE: This medicine is only for you. Do not share this medicine with others. What if I miss a dose? It is important not to miss your dose. Call your doctor or health care professional if you are unable to keep an appointment. What may interact with this medicine? This medicine may interact with the following medications:  other iron products This list may not describe all possible interactions. Give your health care provider a list of all the medicines, herbs, non-prescription drugs, or dietary supplements you use. Also tell them if you smoke, drink alcohol, or use illegal drugs. Some items may interact with your medicine. What should I watch for while using this medicine? Visit your doctor or healthcare professional regularly. Tell your doctor or healthcare professional if your symptoms do not start to get better or if they get worse. You may need blood work done while you are taking this medicine. You may need to follow a special diet. Talk to your doctor. Foods that contain iron include: whole grains/cereals, dried fruits, beans, or peas, leafy green vegetables, and organ meats (liver, kidney). What side effects may I notice from receiving this medicine? Side effects that you should report to your doctor or health care professional as soon as possible:  allergic reactions like skin rash, itching or hives, swelling of  the face, lips, or tongue  breathing problems  changes in blood pressure  feeling faint or lightheaded, falls  fever or chills  flushing, sweating, or hot feelings  swelling of the ankles or feet Side effects that usually do not require medical attention (report to your doctor  or health care professional if they continue or are bothersome):  diarrhea  headache  nausea, vomiting  stomach pain This list may not describe all possible side effects. Call your doctor for medical advice about side effects. You may report side effects to FDA at 1-800-FDA-1088. Where should I keep my medicine? This drug is given in a hospital or clinic and will not be stored at home. NOTE: This sheet is a summary. It may not cover all possible information. If you have questions about this medicine, talk to your doctor, pharmacist, or health care provider.  2020 Elsevier/Gold Standard (2016-03-06 20:21:10)

## 2019-04-03 NOTE — Patient Instructions (Signed)

## 2019-04-04 ENCOUNTER — Other Ambulatory Visit: Payer: Self-pay | Admitting: *Deleted

## 2019-04-04 DIAGNOSIS — C9000 Multiple myeloma not having achieved remission: Secondary | ICD-10-CM

## 2019-04-04 LAB — IGG, IGA, IGM
IgA: 20 mg/dL — ABNORMAL LOW (ref 87–352)
IgG (Immunoglobin G), Serum: 347 mg/dL — ABNORMAL LOW (ref 586–1602)
IgM (Immunoglobulin M), Srm: <5 mg/dL — ABNORMAL LOW (ref 26–217)

## 2019-04-04 LAB — KAPPA/LAMBDA LIGHT CHAINS
Kappa free light chain: 4.6 mg/L (ref 3.3–19.4)
Kappa, lambda light chain ratio: 0.03 — ABNORMAL LOW (ref 0.26–1.65)
Lambda free light chains: 137 mg/L — ABNORMAL HIGH (ref 5.7–26.3)

## 2019-04-04 MED ORDER — POMALIDOMIDE 1 MG PO CAPS: ORAL_CAPSULE | ORAL | 0 refills | Status: DC

## 2019-04-07 LAB — PROTEIN ELECTROPHORESIS, SERUM, WITH REFLEX
A/G Ratio: 1.7 (ref 0.7–1.7)
Albumin ELP: 4 g/dL (ref 2.9–4.4)
Alpha-1-Globulin: 0.2 g/dL (ref 0.0–0.4)
Alpha-2-Globulin: 0.8 g/dL (ref 0.4–1.0)
Beta Globulin: 1 g/dL (ref 0.7–1.3)
Gamma Globulin: 0.3 g/dL — ABNORMAL LOW (ref 0.4–1.8)
Globulin, Total: 2.4 g/dL (ref 2.2–3.9)
Total Protein ELP: 6.4 g/dL (ref 6.0–8.5)

## 2019-04-09 ENCOUNTER — Inpatient Hospital Stay: Payer: Medicare Other

## 2019-04-09 ENCOUNTER — Other Ambulatory Visit: Payer: Self-pay

## 2019-04-09 VITALS — BP 136/68 | HR 85 | Temp 96.9°F | Resp 18

## 2019-04-09 DIAGNOSIS — C9000 Multiple myeloma not having achieved remission: Secondary | ICD-10-CM

## 2019-04-09 DIAGNOSIS — D500 Iron deficiency anemia secondary to blood loss (chronic): Secondary | ICD-10-CM | POA: Diagnosis not present

## 2019-04-09 DIAGNOSIS — C541 Malignant neoplasm of endometrium: Secondary | ICD-10-CM

## 2019-04-09 DIAGNOSIS — R319 Hematuria, unspecified: Secondary | ICD-10-CM | POA: Diagnosis not present

## 2019-04-09 DIAGNOSIS — D508 Other iron deficiency anemias: Secondary | ICD-10-CM

## 2019-04-09 DIAGNOSIS — C9001 Multiple myeloma in remission: Secondary | ICD-10-CM

## 2019-04-09 DIAGNOSIS — Z79899 Other long term (current) drug therapy: Secondary | ICD-10-CM | POA: Diagnosis not present

## 2019-04-09 DIAGNOSIS — Z5112 Encounter for antineoplastic immunotherapy: Secondary | ICD-10-CM | POA: Diagnosis not present

## 2019-04-09 MED ORDER — SODIUM CHLORIDE 0.9 % IV SOLN
510.0000 mg | Freq: Once | INTRAVENOUS | Status: AC
  Administered 2019-04-09: 510 mg via INTRAVENOUS
  Filled 2019-04-09: qty 510

## 2019-04-09 MED ORDER — SODIUM CHLORIDE 0.9% FLUSH
10.0000 mL | INTRAVENOUS | Status: DC | PRN
  Administered 2019-04-09: 10 mL
  Filled 2019-04-09: qty 10

## 2019-04-09 MED ORDER — HEPARIN SOD (PORK) LOCK FLUSH 100 UNIT/ML IV SOLN
500.0000 [IU] | Freq: Once | INTRAVENOUS | Status: AC | PRN
  Administered 2019-04-09: 500 [IU]
  Filled 2019-04-09: qty 5

## 2019-04-09 NOTE — Patient Instructions (Signed)

## 2019-04-10 ENCOUNTER — Telehealth: Payer: Self-pay

## 2019-04-11 DIAGNOSIS — C541 Malignant neoplasm of endometrium: Secondary | ICD-10-CM | POA: Diagnosis not present

## 2019-04-11 DIAGNOSIS — D072 Carcinoma in situ of vagina: Secondary | ICD-10-CM | POA: Diagnosis not present

## 2019-04-15 ENCOUNTER — Ambulatory Visit
Admission: RE | Admit: 2019-04-15 | Discharge: 2019-04-15 | Disposition: A | Discharge status: Home / Self Care | Payer: Medicare Other | Source: Ambulatory Visit | Attending: Internal Medicine | Admitting: Internal Medicine

## 2019-04-15 ENCOUNTER — Other Ambulatory Visit: Payer: Medicare Other

## 2019-04-15 DIAGNOSIS — E041 Nontoxic single thyroid nodule: Secondary | ICD-10-CM

## 2019-04-17 ENCOUNTER — Other Ambulatory Visit: Payer: Self-pay

## 2019-04-17 DIAGNOSIS — E041 Nontoxic single thyroid nodule: Secondary | ICD-10-CM

## 2019-04-21 ENCOUNTER — Other Ambulatory Visit: Payer: Self-pay | Admitting: Family

## 2019-04-21 ENCOUNTER — Telehealth: Payer: Self-pay | Admitting: *Deleted

## 2019-04-21 ENCOUNTER — Telehealth: Payer: Self-pay | Admitting: Hematology & Oncology

## 2019-04-21 DIAGNOSIS — M545 Low back pain, unspecified: Secondary | ICD-10-CM

## 2019-04-21 DIAGNOSIS — C9000 Multiple myeloma not having achieved remission: Secondary | ICD-10-CM

## 2019-04-21 NOTE — Telephone Encounter (Signed)
Message received from patient stating that she is out of town and having "extreme back pain" and would like to know if Dr. Marin Olp would send her in something for pain.  Call placed back to patient and she states that the back pain started yesterday evening to her lower back, is a dull/throbbing pain which radiates to her left shoulder and left arm.  She rates that the pain is a 7/10.  She denies any fever, chills, difficulty with ambulation, weakness to lower extremites, or difficulties with urine or bowels.  Dr. Marin Olp notified and order received for pt to take Advil 600 mg every 8 hours with food for pain and for pt to come in on Thursday once she gets home for xrays and to see Dr. Marin Olp.  Pt appreciative of assistance and has no further questions or concerns at this time.

## 2019-04-21 NOTE — Telephone Encounter (Signed)
Appointments scheduled and I LMVM as well.  Patient then call back to confirm appts per 3/22 sch msg

## 2019-04-24 ENCOUNTER — Ambulatory Visit (HOSPITAL_BASED_OUTPATIENT_CLINIC_OR_DEPARTMENT_OTHER)
Admission: RE | Admit: 2019-04-24 | Discharge: 2019-04-24 | Disposition: A | Discharge status: Home / Self Care | Payer: Medicare Other | Source: Ambulatory Visit | Attending: Family | Admitting: Family

## 2019-04-24 ENCOUNTER — Encounter: Payer: Self-pay | Admitting: Hematology & Oncology

## 2019-04-24 ENCOUNTER — Inpatient Hospital Stay: Payer: Medicare Other

## 2019-04-24 ENCOUNTER — Other Ambulatory Visit: Payer: Self-pay

## 2019-04-24 ENCOUNTER — Other Ambulatory Visit: Payer: Self-pay | Admitting: *Deleted

## 2019-04-24 ENCOUNTER — Inpatient Hospital Stay (HOSPITAL_BASED_OUTPATIENT_CLINIC_OR_DEPARTMENT_OTHER): Payer: Medicare Other | Admitting: Hematology & Oncology

## 2019-04-24 VITALS — BP 141/67 | HR 90 | Temp 97.5°F | Resp 18 | Ht 64.0 in

## 2019-04-24 DIAGNOSIS — C9000 Multiple myeloma not having achieved remission: Secondary | ICD-10-CM

## 2019-04-24 DIAGNOSIS — M545 Low back pain, unspecified: Secondary | ICD-10-CM

## 2019-04-24 DIAGNOSIS — Z95828 Presence of other vascular implants and grafts: Secondary | ICD-10-CM

## 2019-04-24 DIAGNOSIS — E038 Other specified hypothyroidism: Secondary | ICD-10-CM

## 2019-04-24 DIAGNOSIS — D500 Iron deficiency anemia secondary to blood loss (chronic): Secondary | ICD-10-CM | POA: Diagnosis not present

## 2019-04-24 DIAGNOSIS — C541 Malignant neoplasm of endometrium: Secondary | ICD-10-CM | POA: Diagnosis not present

## 2019-04-24 DIAGNOSIS — Z79899 Other long term (current) drug therapy: Secondary | ICD-10-CM | POA: Diagnosis not present

## 2019-04-24 DIAGNOSIS — Z5112 Encounter for antineoplastic immunotherapy: Secondary | ICD-10-CM | POA: Diagnosis not present

## 2019-04-24 DIAGNOSIS — R319 Hematuria, unspecified: Secondary | ICD-10-CM | POA: Diagnosis not present

## 2019-04-24 LAB — CMP (CANCER CENTER ONLY)
ALT: 14 U/L (ref 0–44)
AST: 9 U/L — ABNORMAL LOW (ref 15–41)
Albumin: 4.2 g/dL (ref 3.5–5.0)
Alkaline Phosphatase: 45 U/L (ref 38–126)
Anion gap: 8 (ref 5–15)
BUN: 14 mg/dL (ref 8–23)
CO2: 26 mmol/L (ref 22–32)
Calcium: 9 mg/dL (ref 8.9–10.3)
Chloride: 107 mmol/L (ref 98–111)
Creatinine: 0.88 mg/dL (ref 0.44–1.00)
GFR, Est AFR Am: >60 mL/min (ref >60)
GFR, Estimated: >60 mL/min (ref >60)
Glucose, Bld: 98 mg/dL (ref 70–99)
Potassium: 3.5 mmol/L (ref 3.5–5.1)
Sodium: 141 mmol/L (ref 135–145)
Total Bilirubin: 0.6 mg/dL (ref 0.3–1.2)
Total Protein: 6.3 g/dL — ABNORMAL LOW (ref 6.5–8.1)

## 2019-04-24 LAB — CBC WITH DIFFERENTIAL (CANCER CENTER ONLY)
Abs Immature Granulocytes: 0.01 10*3/uL (ref 0.00–0.07)
Basophils Absolute: 0.1 10*3/uL (ref 0.0–0.1)
Basophils Relative: 1 %
Eosinophils Absolute: 0.2 10*3/uL (ref 0.0–0.5)
Eosinophils Relative: 4 %
HCT: 31.8 % — ABNORMAL LOW (ref 36.0–46.0)
Hemoglobin: 10.2 g/dL — ABNORMAL LOW (ref 12.0–15.0)
Immature Granulocytes: 0 %
Lymphocytes Relative: 12 %
Lymphs Abs: 0.7 10*3/uL (ref 0.7–4.0)
MCH: 27.6 pg (ref 26.0–34.0)
MCHC: 32.1 g/dL (ref 30.0–36.0)
MCV: 85.9 fL (ref 80.0–100.0)
Monocytes Absolute: 1 10*3/uL (ref 0.1–1.0)
Monocytes Relative: 18 %
Neutro Abs: 3.8 10*3/uL (ref 1.7–7.7)
Neutrophils Relative %: 65 %
Platelet Count: 206 10*3/uL (ref 150–400)
RBC: 3.7 MIL/uL — ABNORMAL LOW (ref 3.87–5.11)
RDW: 15.6 % — ABNORMAL HIGH (ref 11.5–15.5)
WBC Count: 5.7 10*3/uL (ref 4.0–10.5)
nRBC: 0 % (ref 0.0–0.2)

## 2019-04-24 MED ORDER — ACETAMINOPHEN 325 MG PO TABS: 650.0000 mg | ORAL_TABLET | Freq: Once | ORAL | Status: DC

## 2019-04-24 MED ORDER — SODIUM CHLORIDE 0.9% FLUSH
10.0000 mL | INTRAVENOUS | Status: DC | PRN
  Administered 2019-04-24: 10 mL via INTRAVENOUS
  Filled 2019-04-24: qty 10

## 2019-04-24 MED ORDER — POMALIDOMIDE 1 MG PO CAPS: ORAL_CAPSULE | ORAL | 0 refills | Status: DC

## 2019-04-24 MED ORDER — DIPHENHYDRAMINE HCL 25 MG PO CAPS
ORAL_CAPSULE | ORAL | Status: AC
  Filled 2019-04-24: qty 2

## 2019-04-24 MED ORDER — DEXAMETHASONE 4 MG PO TABS
ORAL_TABLET | ORAL | Status: AC
  Filled 2019-04-24: qty 5

## 2019-04-24 MED ORDER — DARATUMUMAB-HYALURONIDASE-FIHJ 1800-30000 MG-UT/15ML ~~LOC~~ SOLN: 1800.0000 mg | Freq: Once | SUBCUTANEOUS | Status: DC

## 2019-04-24 MED ORDER — DEXAMETHASONE 4 MG PO TABS: 20.0000 mg | ORAL_TABLET | Freq: Once | ORAL | Status: DC

## 2019-04-24 MED ORDER — HEPARIN SOD (PORK) LOCK FLUSH 100 UNIT/ML IV SOLN
500.0000 [IU] | Freq: Once | INTRAVENOUS | Status: AC
  Administered 2019-04-24: 500 [IU] via INTRAVENOUS
  Filled 2019-04-24: qty 5

## 2019-04-24 MED ORDER — ACETAMINOPHEN 325 MG PO TABS
ORAL_TABLET | ORAL | Status: AC
  Filled 2019-04-24: qty 2

## 2019-04-24 MED ORDER — DIPHENHYDRAMINE HCL 25 MG PO CAPS: 50.0000 mg | ORAL_CAPSULE | Freq: Once | ORAL | Status: DC

## 2019-04-24 NOTE — Addendum Note (Signed)
Addended by: Rico Ala on: 04/24/2019 01:09 PM   Modules accepted: Orders, SmartSet

## 2019-04-24 NOTE — Patient Instructions (Signed)

## 2019-04-24 NOTE — Progress Notes (Signed)
Hematology and Oncology Follow Up Visit  NIMO WHILLOCK [id number] [birth year] 69 y.o. 04/24/2019   Principle Diagnosis:  Recurrent lambda light chain myeloma History of recurrent endometrial carcinoma Iron deficiency anemia-blood loss  PastTherapy: Status post second autologous stem cell transplant on 07/24/2014 Maintenance therapy with Pomalidomide/every 2 week Velcade - d/c'ed Xgeva 120 mg subcutaneous every 3months -next dose inJune 2021 Radiation therapy for endometrial recurrence - completed 04/20/2015 Pomalyst/Kyprolis 70mg /m2 IV q 2 weeks - s/p cycle #12 - held on 12/26/2017 for hematuria  Current Therapy:   Daratumumab/Pomalyst (1 mg) - status post cycle #13 Femara 2.5 mg po q day IV iron as indicated   Interim History:  Ms. Malley is here today for follow-up.  She is actually here a week early.  She had called [place] when she was in [place].  She was down there helping her mother out.  She had developed this acute onset of lower back pain.  She had no pain radiation.  There is no weakness in her legs.  There is no change in bowel or bladder habits.  She did not fall.  She denies any kind of trauma.  It seems as if the pain did get better on its own.  We did get some plain x-rays of her lumbar spine.  This did not show any compression fracture.  They did not see anything that looks like myelomatous type lesions.  Unfortunate, it looks like her lambda light chain is still going up slowly but surely.  3 weeks ago, her lambda light chain was 13.7 mg/dL.  Prior to this it was 11 mg/dL.  We might have to make a change with her protocol.  She has had no fever.  She has had no cough or shortness of breath.  She has had no rashes.  She has had no leg swelling.  She has had no headache.  Overall, her performance status is ECOG 0.  Medications:  Allergies as of 04/24/2019      Reactions   Codeine Nausea Only      Medication List       Accurate  as of April 24, 2019  5:27 PM. If you have any questions, ask your nurse or doctor.        acyclovir 400 MG tablet Commonly known as: ZOVIRAX TAKE 1 TABLET(400 MG) BY MOUTH TWICE DAILY   albuterol 108 (90 Base) MCG/ACT inhaler Commonly known as: VENTOLIN HFA Inhale 2 puffs into the lungs every 6 (six) hours as needed for wheezing or shortness of breath. 2 puffs 3 times daily x 5 days then every 6 hours as needed.   amLODipine 10 MG tablet Commonly known as: NORVASC Take 1 tablet (10 mg total) by mouth every morning.   aspirin EC 81 MG tablet Take 81 mg by mouth 2 (two) times daily. Stopped  By dr Myna Hidalgo until urology procedures complete   CALCIUM 1200+D3 PO Take 1 tablet by mouth daily.   daratumumab 400 MG/20ML Commonly known as: DARZALEX Inject into the vein.   hydrochlorothiazide 12.5 MG capsule Commonly known as: MICROZIDE Take 12.5 mg by mouth every morning.   letrozole 2.5 MG tablet Commonly known as: FEMARA Take 1 tablet (2.5 mg total) by mouth daily.   loperamide 2 MG capsule Commonly known as: IMODIUM Take by mouth as needed for diarrhea or loose stools. Reported on 08/19/2015   loratadine 10 MG tablet Commonly known as: CLARITIN Take 10 mg by mouth every morning. Reported on 08/19/2015  LORazepam 0.5 MG tablet Commonly known as: Ativan Take 1 tablet (0.5 mg total) by mouth every 6 (six) hours as needed (Nausea or vomiting).   metoprolol succinate 50 MG 24 hr tablet Commonly known as: TOPROL-XL Take 50 mg by mouth every morning.   montelukast 10 MG tablet Commonly known as: SINGULAIR TAKE 1 TABLET(10 MG) BY MOUTH AT BEDTIME   Nasacort Allergy 24HR 55 MCG/ACT Aero nasal inhaler Generic drug: triamcinolone Place 2 sprays into the nose as needed.   pomalidomide 1 MG capsule Commonly known as: Pomalyst TAKE 1 CAPSULE BY MOUTH ONCE DAILY FOR 21 DAYS ON AND 7 DAYS OFFAuth#[id number] What changed: additional instructions Changed by: Margaretha Seeds, RN     potassium chloride SA 20 MEQ tablet Commonly known as: KLOR-CON TAKE 1 TABLET(20 MEQ) BY MOUTH TWICE DAILY   PROBIOTIC DAILY PO Take by mouth daily.   triamterene-hydrochlorothiazide 37.5-25 MG tablet Commonly known as: MAXZIDE-25 1 tablet by mouth at bedtime   Vitamin D3 50 MCG (2000 UT) Tabs Take 2 tablets by mouth daily.       Allergies:  Allergies  Allergen Reactions  . Codeine Nausea Only    Past Medical History, Surgical history, Social history, and Family History were reviewed and updated.  Review of Systems: Review of Systems  Constitutional: Negative.   HENT: Negative.   Eyes: Negative.   Cardiovascular: Negative.   Gastrointestinal: Negative.   Genitourinary: Positive for hematuria.  Musculoskeletal: Negative.   Skin: Negative.   Neurological: Negative.   Endo/Heme/Allergies: Negative.   Psychiatric/Behavioral: Negative.      Physical Exam:  height is 5\' 4"  (1.626 m). Her oral temperature is 97.5 F (36.4 C) (abnormal). Her blood pressure is 141/67 (abnormal) and her pulse is 90. Her respiration is 18 and oxygen saturation is 99%.   Wt Readings from Last 3 Encounters:  04/03/19 197 lb 0.6 oz (89.4 kg)  03/31/19 196 lb (88.9 kg)  02/27/19 193 lb (87.5 kg)      Physical Activity: Inactive  . Days of Exercise per Week: 0 days  . Minutes of Exercise per Session: 0 min   Physical Exam Vitals reviewed.  HENT:     Head: Normocephalic and atraumatic.  Eyes:     Pupils: Pupils are equal, round, and reactive to light.  Cardiovascular:     Rate and Rhythm: Normal rate and regular rhythm.     Heart sounds: Normal heart sounds.  Pulmonary:     Effort: Pulmonary effort is normal.     Breath sounds: Normal breath sounds.  Abdominal:     General: Bowel sounds are normal.     Palpations: Abdomen is soft.  Musculoskeletal:        General: No tenderness or deformity. Normal range of motion.     Cervical back: Normal range of motion.  Lymphadenopathy:      Cervical: No cervical adenopathy.  Skin:    General: Skin is warm and dry.     Findings: No erythema or rash.  Neurological:     Mental Status: She is alert and oriented to person, place, and time.  Psychiatric:        Behavior: Behavior normal.        Thought Content: Thought content normal.        Judgment: Judgment normal.      Lab Results  Component Value Date   WBC 5.7 04/24/2019   HGB 10.2 (L) 04/24/2019   HCT 31.8 (L) 04/24/2019   MCV 85.9 04/24/2019  PLT 206 04/24/2019   Lab Results  Component Value Date   FERRITIN 1,535 (H) 04/03/2019   IRON 74 04/03/2019   TIBC 374 04/03/2019   UIBC 300 04/03/2019   IRONPCTSAT 20 (L) 04/03/2019   Lab Results  Component Value Date   RETICCTPCT 2.9 (H) 10/17/2017   RBC 3.70 (L) 04/24/2019   Lab Results  Component Value Date   KPAFRELGTCHN 4.6 04/03/2019   LAMBDASER 137.0 (H) 04/03/2019   KAPLAMBRATIO 0.03 (L) 04/03/2019   Lab Results  Component Value Date   IGGSERUM 347 (L) 04/03/2019   IGA 20 (L) 04/03/2019   IGMSERUM <5 (L) 04/03/2019   Lab Results  Component Value Date   TOTALPROTELP 6.4 04/03/2019   ALBUMINELP 4.0 04/03/2019   A1GS 0.2 04/03/2019   A2GS 0.8 04/03/2019   BETS 1.0 04/03/2019   BETA2SER 0.4 11/23/2014   GAMS 0.3 (L) 04/03/2019   MSPIKE Not Observed 04/03/2019   SPEI Comment 01/30/2019     Chemistry      Component Value Date/Time   NA 141 04/24/2019 1131   NA 141 01/10/2017 1115   NA 140 06/21/2016 0918   K 3.5 04/24/2019 1131   K 4.0 01/10/2017 1115   K 4.3 06/21/2016 0918   CL 107 04/24/2019 1131   CL 106 01/10/2017 1115   CO2 26 04/24/2019 1131   CO2 27 01/10/2017 1115   CO2 20 (L) 06/21/2016 0918   BUN 14 04/24/2019 1131   BUN 15 01/10/2017 1115   BUN 15.8 06/21/2016 0918   CREATININE 0.88 04/24/2019 1131   CREATININE 1.0 01/10/2017 1115   CREATININE 0.8 06/21/2016 0918      Component Value Date/Time   CALCIUM 9.0 04/24/2019 1131   CALCIUM 9.5 01/10/2017 1115    CALCIUM 9.4 06/21/2016 0918   ALKPHOS 45 04/24/2019 1131   ALKPHOS 40 01/10/2017 1115   ALKPHOS 66 06/21/2016 0918   AST 9 (L) 04/24/2019 1131   AST 17 06/21/2016 0918   ALT 14 04/24/2019 1131   ALT 19 01/10/2017 1115   ALT 37 06/21/2016 0918   BILITOT 0.6 04/24/2019 1131   BILITOT 0.32 06/21/2016 0918       Impression and Plan: Ms. Guiza is a very pleasant 69 yo African American female with recurrent lambda light chain myeloma. Her second a stem cell transplant for light chain myeloma was in June 2016.   Shealsohaslocalized recurrent endometrial cancer (followed by gyn onc Dr. Nelly Rout).  She is on Femara for this.  Again, I believe it worried about the lambda light chain going up.  We will have to see what it is today.  I think that if it is higher, I might want to consider adding Kyprolis to the daratumumab.  This might help [place] out.  Another option would be to switch over to Selinexor.  I think Selinexor with Kyprolis would also be a reasonable option.  I am just glad that her back pain is much better.  Again I am not sure exactly what happened.  I thought that she may have had a compression fracture from myeloma but thankfully the x-rays do not really show [place] that.  She will go ahead with her treatment today with the daratumumab.  I will plan to see her back in another month.  Josph Macho, MD 3/25/20215:27 PM

## 2019-04-24 NOTE — Progress Notes (Unsigned)
Pharmacist Chemotherapy Monitoring - Follow Up Assessment    I verify that I have reviewed each item in the below checklist:  . Regimen for the patient is scheduled for the appropriate day and plan matches scheduled date. [name] Kitchen Appropriate non-routine labs are ordered dependent on drug ordered. . If applicable, additional medications reviewed and ordered per protocol based on lifetime cumulative doses and/or treatment regimen.   Plan for follow-up and/or issues identified: No . I-vent associated with next due treatment: No . MD and/or nursing notified: No  Ziyonna Christner, Jacqlyn Larsen 04/24/2019 8:03 AM

## 2019-04-25 ENCOUNTER — Telehealth: Payer: Self-pay | Admitting: Hematology & Oncology

## 2019-04-25 LAB — KAPPA/LAMBDA LIGHT CHAINS
Kappa free light chain: 2.8 mg/L — ABNORMAL LOW (ref 3.3–19.4)
Kappa, lambda light chain ratio: 0.02 — ABNORMAL LOW (ref 0.26–1.65)
Lambda free light chains: 137 mg/L — ABNORMAL HIGH (ref 5.7–26.3)

## 2019-04-25 LAB — IGG, IGA, IGM
IgA: 12 mg/dL — ABNORMAL LOW (ref 87–352)
IgG (Immunoglobin G), Serum: 273 mg/dL — ABNORMAL LOW (ref 586–1602)
IgM (Immunoglobulin M), Srm: <5 mg/dL — ABNORMAL LOW (ref 26–217)

## 2019-04-25 LAB — IRON AND TIBC
Iron: 62 ug/dL (ref 41–142)
Saturation Ratios: 22 % (ref 21–57)
TIBC: 283 ug/dL (ref 236–444)
UIBC: 220 ug/dL (ref 120–384)

## 2019-04-25 LAB — FERRITIN: Ferritin: 2988 ng/mL — ABNORMAL HIGH (ref 11–307)

## 2019-04-25 LAB — TSH: TSH: 2.414 u[IU]/mL (ref 0.308–3.960)

## 2019-04-25 NOTE — Telephone Encounter (Signed)
Appts scheduled by [facility] on 3/25/ I was out on PAL. Per 3/25 los

## 2019-04-29 LAB — PROTEIN ELECTROPHORESIS, SERUM, WITH REFLEX
A/G Ratio: 1.4 (ref 0.7–1.7)
Albumin ELP: 3.4 g/dL (ref 2.9–4.4)
Alpha-1-Globulin: 0.3 g/dL (ref 0.0–0.4)
Alpha-2-Globulin: 1 g/dL (ref 0.4–1.0)
Beta Globulin: 0.9 g/dL (ref 0.7–1.3)
Gamma Globulin: 0.3 g/dL — ABNORMAL LOW (ref 0.4–1.8)
Globulin, Total: 2.5 g/dL (ref 2.2–3.9)
M-Spike, %: 0.1 g/dL — ABNORMAL HIGH
SPEP Interpretation: 0
Total Protein ELP: 5.9 g/dL — ABNORMAL LOW (ref 6.0–8.5)

## 2019-04-29 LAB — IMMUNOFIXATION REFLEX, SERUM
IgA: 16 mg/dL — ABNORMAL LOW (ref 87–352)
IgG (Immunoglobin G), Serum: 320 mg/dL — ABNORMAL LOW (ref 586–1602)
IgM (Immunoglobulin M), Srm: <5 mg/dL — ABNORMAL LOW (ref 26–217)

## 2019-04-30 ENCOUNTER — Ambulatory Visit
Admission: RE | Admit: 2019-04-30 | Discharge: 2019-04-30 | Disposition: A | Discharge status: Home / Self Care | Payer: Medicare Other | Source: Ambulatory Visit | Attending: Internal Medicine | Admitting: Internal Medicine

## 2019-04-30 ENCOUNTER — Other Ambulatory Visit (HOSPITAL_COMMUNITY)
Admission: RE | Admit: 2019-04-30 | Discharge: 2019-04-30 | Disposition: A | Discharge status: Home / Self Care | Payer: Medicare Other | Source: Ambulatory Visit | Attending: Physician Assistant | Admitting: Physician Assistant

## 2019-04-30 DIAGNOSIS — E041 Nontoxic single thyroid nodule: Secondary | ICD-10-CM

## 2019-04-30 NOTE — Procedures (Signed)
PROCEDURE SUMMARY:  Using direct ultrasound guidance, 5 passes were made using 25 g needles into the nodule within the left lobe of the thyroid.   Ultrasound was used to confirm needle placements on all occasions.   EBL = trace  Specimens were sent to Pathology for analysis.  See procedure note under Imaging tab in Epic for full procedure details.  Neylan Koroma S Eustacia Urbanek PA-C 04/30/2019 3:25 PM

## 2019-05-01 ENCOUNTER — Inpatient Hospital Stay: Payer: Medicare Other

## 2019-05-01 ENCOUNTER — Inpatient Hospital Stay: Payer: Medicare Other | Admitting: Family

## 2019-05-01 ENCOUNTER — Other Ambulatory Visit: Payer: Self-pay

## 2019-05-01 ENCOUNTER — Inpatient Hospital Stay: Payer: Medicare Other | Attending: Hematology & Oncology

## 2019-05-01 DIAGNOSIS — Z5112 Encounter for antineoplastic immunotherapy: Secondary | ICD-10-CM | POA: Insufficient documentation

## 2019-05-01 DIAGNOSIS — R202 Paresthesia of skin: Secondary | ICD-10-CM | POA: Insufficient documentation

## 2019-05-01 DIAGNOSIS — D509 Iron deficiency anemia, unspecified: Secondary | ICD-10-CM | POA: Diagnosis not present

## 2019-05-01 DIAGNOSIS — Z8542 Personal history of malignant neoplasm of other parts of uterus: Secondary | ICD-10-CM | POA: Insufficient documentation

## 2019-05-01 DIAGNOSIS — C9000 Multiple myeloma not having achieved remission: Secondary | ICD-10-CM | POA: Diagnosis not present

## 2019-05-01 DIAGNOSIS — C9001 Multiple myeloma in remission: Secondary | ICD-10-CM

## 2019-05-01 DIAGNOSIS — Z79899 Other long term (current) drug therapy: Secondary | ICD-10-CM | POA: Diagnosis not present

## 2019-05-01 DIAGNOSIS — C541 Malignant neoplasm of endometrium: Secondary | ICD-10-CM | POA: Diagnosis not present

## 2019-05-01 DIAGNOSIS — R200 Anesthesia of skin: Secondary | ICD-10-CM | POA: Insufficient documentation

## 2019-05-01 DIAGNOSIS — R232 Flushing: Secondary | ICD-10-CM | POA: Diagnosis not present

## 2019-05-01 LAB — CYTOLOGY - NON PAP

## 2019-05-01 MED ORDER — SODIUM CHLORIDE 0.9% FLUSH
10.0000 mL | INTRAVENOUS | Status: DC | PRN
  Filled 2019-05-01: qty 10

## 2019-05-01 MED ORDER — DIPHENHYDRAMINE HCL 25 MG PO CAPS
50.0000 mg | ORAL_CAPSULE | Freq: Once | ORAL | Status: AC
  Administered 2019-05-01: 50 mg via ORAL

## 2019-05-01 MED ORDER — ACETAMINOPHEN 325 MG PO TABS
ORAL_TABLET | ORAL | Status: AC
  Filled 2019-05-01: qty 2

## 2019-05-01 MED ORDER — SODIUM CHLORIDE 0.9 % IV SOLN
Freq: Once | INTRAVENOUS | Status: DC
  Filled 2019-05-01: qty 250

## 2019-05-01 MED ORDER — DEXAMETHASONE 4 MG PO TABS
20.0000 mg | ORAL_TABLET | Freq: Once | ORAL | Status: AC
  Administered 2019-05-01: 10:00:00 20 mg via ORAL

## 2019-05-01 MED ORDER — HEPARIN SOD (PORK) LOCK FLUSH 100 UNIT/ML IV SOLN
500.0000 [IU] | Freq: Once | INTRAVENOUS | Status: DC | PRN
  Filled 2019-05-01: qty 5

## 2019-05-01 MED ORDER — DEXAMETHASONE 4 MG PO TABS
ORAL_TABLET | ORAL | Status: AC
  Filled 2019-05-01: qty 5

## 2019-05-01 MED ORDER — DARATUMUMAB-HYALURONIDASE-FIHJ 1800-30000 MG-UT/15ML ~~LOC~~ SOLN
1800.0000 mg | Freq: Once | SUBCUTANEOUS | Status: AC
  Administered 2019-05-01: 1800 mg via SUBCUTANEOUS
  Filled 2019-05-01: qty 15

## 2019-05-01 MED ORDER — ACETAMINOPHEN 325 MG PO TABS
650.0000 mg | ORAL_TABLET | Freq: Once | ORAL | Status: AC
  Administered 2019-05-01: 650 mg via ORAL

## 2019-05-01 MED ORDER — DIPHENHYDRAMINE HCL 25 MG PO CAPS
ORAL_CAPSULE | ORAL | Status: AC
  Filled 2019-05-01: qty 2

## 2019-05-01 NOTE — Progress Notes (Signed)
Ok to treat today with 04/24/19 labs per Dr. Marin Olp

## 2019-05-01 NOTE — Progress Notes (Signed)
Patient  Tolerated injection without complication.  Did not want to wait 30 min because of a funeral she needed to attend.

## 2019-05-01 NOTE — Patient Instructions (Signed)
Daratumumab injection What is this medicine? DARATUMUMAB (dar a toom ue mab) is a monoclonal antibody. It is used to treat multiple myeloma. This medicine may be used for other purposes; ask your health care provider or pharmacist if you have questions. COMMON BRAND NAME(S): DARZALEX What should I tell my health care provider before I take this medicine? They need to know if you have any of these conditions:  infection (especially a virus infection such as chickenpox, herpes, or hepatitis B virus)  lung or breathing disease  an unusual or allergic reaction to daratumumab, other medicines, foods, dyes, or preservatives  pregnant or trying to get pregnant  breast-feeding How should I use this medicine? This medicine is for infusion into a vein. It is given by a health care professional in a hospital or clinic setting. Talk to your pediatrician regarding the use of this medicine in children. Special care may be needed. Overdosage: If you think you have taken too much of this medicine contact a poison control center or emergency room at once. NOTE: This medicine is only for you. Do not share this medicine with others. What if I miss a dose? Keep appointments for follow-up doses as directed. It is important not to miss your dose. Call your doctor or health care professional if you are unable to keep an appointment. What may interact with this medicine? Interactions have not been studied. This list may not describe all possible interactions. Give your health care provider a list of all the medicines, herbs, non-prescription drugs, or dietary supplements you use. Also tell them if you smoke, drink alcohol, or use illegal drugs. Some items may interact with your medicine. What should I watch for while using this medicine? This drug may make you feel generally unwell. Report any side effects. Continue your course of treatment even though you feel ill unless your doctor tells you to stop. This  medicine can cause serious allergic reactions. To reduce your risk you may need to take medicine before treatment with this medicine. Take your medicine as directed. This medicine can affect the results of blood tests to match your blood type. These changes can last for up to 6 months after the final dose. Your healthcare provider will do blood tests to match your blood type before you start treatment. Tell all of your healthcare providers that you are being treated with this medicine before receiving a blood transfusion. This medicine can affect the results of some tests used to determine treatment response; extra tests may be needed to evaluate response. Do not become pregnant while taking this medicine or for 3 months after stopping it. Women should inform their doctor if they wish to become pregnant or think they might be pregnant. There is a potential for serious side effects to an unborn child. Talk to your health care professional or pharmacist for more information. What side effects may I notice from receiving this medicine? Side effects that you should report to your doctor or health care professional as soon as possible:  allergic reactions like skin rash, itching or hives, swelling of the face, lips, or tongue  breathing problems  chills  cough  dizziness  feeling faint or lightheaded  headache  low blood counts - this medicine may decrease the number of white blood cells, red blood cells and platelets. You may be at increased risk for infections and bleeding.  nausea, vomiting  shortness of breath  signs of decreased platelets or bleeding - bruising, pinpoint red spots on  the skin, black, tarry stools, blood in the urine  signs of decreased red blood cells - unusually weak or tired, feeling faint or lightheaded, falls  signs of infection - fever or chills, cough, sore throat, pain or difficulty passing urine  signs and symptoms of liver injury like dark yellow or brown  urine; general ill feeling or flu-like symptoms; light-colored stools; loss of appetite; right upper belly pain; unusually weak or tired; yellowing of the eyes or skin Side effects that usually do not require medical attention (report to your doctor or health care professional if they continue or are bothersome):  back pain  constipation  diarrhea  joint pain  muscle cramps  pain, tingling, numbness in the hands or feet  swelling of the ankles, feet, hands  tiredness  trouble sleeping This list may not describe all possible side effects. Call your doctor for medical advice about side effects. You may report side effects to FDA at 1-800-FDA-1088. Where should I keep my medicine? This drug is given in a hospital or clinic and will not be stored at home. NOTE: This sheet is a summary. It may not cover all possible information. If you have questions about this medicine, talk to your doctor, pharmacist, or health care provider.  2020 Elsevier/Gold Standard (2018-09-24 18:10:54)  

## 2019-05-21 ENCOUNTER — Other Ambulatory Visit: Payer: Medicare Other

## 2019-05-21 ENCOUNTER — Ambulatory Visit: Payer: Medicare Other

## 2019-05-21 ENCOUNTER — Ambulatory Visit: Payer: Medicare Other | Admitting: Family

## 2019-05-22 NOTE — Progress Notes (Unsigned)
Pharmacist Chemotherapy Monitoring - Follow Up Assessment    I verify that I have reviewed each item in the below checklist:  . Regimen for the patient is scheduled for the appropriate day and plan matches scheduled date. [name] Kitchen Appropriate non-routine labs are ordered dependent on drug ordered. . If applicable, additional medications reviewed and ordered per protocol based on lifetime cumulative doses and/or treatment regimen.   Plan for follow-up and/or issues identified: Yes . I-vent associated with next due treatment: Yes . MD and/or nursing notified: Yes  Emily Greer, Jacqlyn Larsen 05/22/2019 8:41 AM

## 2019-05-29 ENCOUNTER — Ambulatory Visit: Payer: Medicare Other | Admitting: Hematology & Oncology

## 2019-05-29 ENCOUNTER — Other Ambulatory Visit: Payer: Medicare Other

## 2019-05-29 ENCOUNTER — Inpatient Hospital Stay: Payer: Medicare Other | Admitting: Family

## 2019-05-29 ENCOUNTER — Ambulatory Visit: Payer: Medicare Other

## 2019-05-29 ENCOUNTER — Inpatient Hospital Stay: Payer: Medicare Other

## 2019-05-30 ENCOUNTER — Encounter: Payer: Self-pay | Admitting: Family

## 2019-05-30 ENCOUNTER — Other Ambulatory Visit: Payer: Medicare Other

## 2019-05-30 ENCOUNTER — Other Ambulatory Visit: Payer: Self-pay

## 2019-05-30 ENCOUNTER — Ambulatory Visit: Payer: Medicare Other | Admitting: Hematology & Oncology

## 2019-05-30 ENCOUNTER — Ambulatory Visit: Payer: Medicare Other

## 2019-05-30 ENCOUNTER — Inpatient Hospital Stay: Payer: Medicare Other

## 2019-05-30 ENCOUNTER — Inpatient Hospital Stay (HOSPITAL_BASED_OUTPATIENT_CLINIC_OR_DEPARTMENT_OTHER): Payer: Medicare Other | Admitting: Family

## 2019-05-30 VITALS — BP 136/78 | HR 85 | Temp 97.1°F | Resp 18

## 2019-05-30 VITALS — BP 148/83 | HR 100 | Temp 97.1°F | Resp 18 | Ht 64.0 in | Wt 194.1 lb

## 2019-05-30 DIAGNOSIS — C9000 Multiple myeloma not having achieved remission: Secondary | ICD-10-CM | POA: Diagnosis not present

## 2019-05-30 DIAGNOSIS — C541 Malignant neoplasm of endometrium: Secondary | ICD-10-CM | POA: Diagnosis not present

## 2019-05-30 DIAGNOSIS — D500 Iron deficiency anemia secondary to blood loss (chronic): Secondary | ICD-10-CM | POA: Diagnosis not present

## 2019-05-30 DIAGNOSIS — Z5112 Encounter for antineoplastic immunotherapy: Secondary | ICD-10-CM | POA: Diagnosis not present

## 2019-05-30 DIAGNOSIS — R232 Flushing: Secondary | ICD-10-CM | POA: Diagnosis not present

## 2019-05-30 DIAGNOSIS — C9001 Multiple myeloma in remission: Secondary | ICD-10-CM

## 2019-05-30 DIAGNOSIS — R200 Anesthesia of skin: Secondary | ICD-10-CM | POA: Diagnosis not present

## 2019-05-30 DIAGNOSIS — D509 Iron deficiency anemia, unspecified: Secondary | ICD-10-CM | POA: Diagnosis not present

## 2019-05-30 LAB — CBC WITH DIFFERENTIAL (CANCER CENTER ONLY)
Abs Immature Granulocytes: 0.03 10*3/uL (ref 0.00–0.07)
Basophils Absolute: 0.1 10*3/uL (ref 0.0–0.1)
Basophils Relative: 2 %
Eosinophils Absolute: 0.4 10*3/uL (ref 0.0–0.5)
Eosinophils Relative: 7 %
HCT: 36.4 % (ref 36.0–46.0)
Hemoglobin: 12 g/dL (ref 12.0–15.0)
Immature Granulocytes: 1 %
Lymphocytes Relative: 15 %
Lymphs Abs: 0.8 10*3/uL (ref 0.7–4.0)
MCH: 27.8 pg (ref 26.0–34.0)
MCHC: 33 g/dL (ref 30.0–36.0)
MCV: 84.3 fL (ref 80.0–100.0)
Monocytes Absolute: 1.1 10*3/uL — ABNORMAL HIGH (ref 0.1–1.0)
Monocytes Relative: 19 %
Neutro Abs: 3.1 10*3/uL (ref 1.7–7.7)
Neutrophils Relative %: 56 %
Platelet Count: 243 10*3/uL (ref 150–400)
RBC: 4.32 MIL/uL (ref 3.87–5.11)
RDW: 15.1 % (ref 11.5–15.5)
WBC Count: 5.5 10*3/uL (ref 4.0–10.5)
nRBC: 0 % (ref 0.0–0.2)

## 2019-05-30 LAB — CMP (CANCER CENTER ONLY)
ALT: 15 U/L (ref 0–44)
AST: 12 U/L — ABNORMAL LOW (ref 15–41)
Albumin: 4.7 g/dL (ref 3.5–5.0)
Alkaline Phosphatase: 37 U/L — ABNORMAL LOW (ref 38–126)
Anion gap: 10 (ref 5–15)
BUN: 20 mg/dL (ref 8–23)
CO2: 27 mmol/L (ref 22–32)
Calcium: 11 mg/dL — ABNORMAL HIGH (ref 8.9–10.3)
Chloride: 102 mmol/L (ref 98–111)
Creatinine: 1.09 mg/dL — ABNORMAL HIGH (ref 0.44–1.00)
GFR, Est AFR Am: >60 mL/min (ref >60)
GFR, Estimated: 52 mL/min — ABNORMAL LOW (ref >60)
Glucose, Bld: 98 mg/dL (ref 70–99)
Potassium: 3.8 mmol/L (ref 3.5–5.1)
Sodium: 139 mmol/L (ref 135–145)
Total Bilirubin: 0.5 mg/dL (ref 0.3–1.2)
Total Protein: 6.7 g/dL (ref 6.5–8.1)

## 2019-05-30 MED ORDER — ACETAMINOPHEN 325 MG PO TABS
ORAL_TABLET | ORAL | Status: AC
  Filled 2019-05-30: qty 2

## 2019-05-30 MED ORDER — SODIUM CHLORIDE 0.9% FLUSH
10.0000 mL | INTRAVENOUS | Status: DC | PRN
  Administered 2019-05-30: 10 mL
  Filled 2019-05-30: qty 10

## 2019-05-30 MED ORDER — HEPARIN SOD (PORK) LOCK FLUSH 100 UNIT/ML IV SOLN
500.0000 [IU] | Freq: Once | INTRAVENOUS | Status: AC | PRN
  Administered 2019-05-30: 500 [IU]
  Filled 2019-05-30: qty 5

## 2019-05-30 MED ORDER — DARATUMUMAB-HYALURONIDASE-FIHJ 1800-30000 MG-UT/15ML ~~LOC~~ SOLN
1800.0000 mg | Freq: Once | SUBCUTANEOUS | Status: AC
  Administered 2019-05-30: 1800 mg via SUBCUTANEOUS
  Filled 2019-05-30: qty 15

## 2019-05-30 MED ORDER — ACETAMINOPHEN 325 MG PO TABS
650.0000 mg | ORAL_TABLET | Freq: Once | ORAL | Status: AC
  Administered 2019-05-30: 650 mg via ORAL

## 2019-05-30 MED ORDER — DENOSUMAB 120 MG/1.7ML ~~LOC~~ SOLN
120.0000 mg | Freq: Once | SUBCUTANEOUS | Status: AC
  Administered 2019-05-30: 120 mg via SUBCUTANEOUS

## 2019-05-30 MED ORDER — DENOSUMAB 120 MG/1.7ML ~~LOC~~ SOLN
SUBCUTANEOUS | Status: AC
  Filled 2019-05-30: qty 1.7

## 2019-05-30 MED ORDER — DIPHENHYDRAMINE HCL 25 MG PO CAPS
50.0000 mg | ORAL_CAPSULE | Freq: Once | ORAL | Status: AC
  Administered 2019-05-30: 12:00:00 50 mg via ORAL

## 2019-05-30 MED ORDER — DIPHENHYDRAMINE HCL 25 MG PO CAPS
ORAL_CAPSULE | ORAL | Status: AC
  Filled 2019-05-30: qty 2

## 2019-05-30 MED ORDER — DEXAMETHASONE 4 MG PO TABS
20.0000 mg | ORAL_TABLET | Freq: Once | ORAL | Status: AC
  Administered 2019-05-30: 20 mg via ORAL

## 2019-05-30 MED ORDER — DEXAMETHASONE 4 MG PO TABS
ORAL_TABLET | ORAL | Status: AC
  Filled 2019-05-30: qty 5

## 2019-05-30 NOTE — Progress Notes (Signed)
Pt. Refused to wait one hour post Darzalex. Released stable and ASX.

## 2019-05-30 NOTE — Patient Instructions (Signed)

## 2019-05-30 NOTE — Progress Notes (Signed)
Hematology and Oncology Follow Up Visit  Emily Greer [id number] 10-13-1950 69 y.o. 05/30/2019   Principle Diagnosis:  Recurrent lambda light chain myeloma History of recurrent endometrial carcinoma Iron deficiency anemia-blood loss  PastTherapy: Status post second autologous stem cell transplant on 07/24/2014 Maintenance therapy with Pomalidomide/every 2 week Velcade - d/c'ed Xgeva 120 mg subcutaneous every 3months -next dose inJune 2021 Radiation therapy for endometrial recurrence - completed 04/20/2015 Pomalyst/Kyprolis 70mg /m2 IV q 2 weeks - s/p cycle #12 - held on 12/26/2017 for hematuria  Current Therapy:        Daratumumab/Pomalyst (1 mg) - status post cycle#13 Femara 2.5 mg po q day IV iron as indicated   Interim History:  [name] is here today for follow-up and treatment. She is doing well.  She has noted some puffiness in the right ankle but states this may be due to her missing several days of her diuretic. Pedal pulses are 3+. No redness or pitting edema noted. She will let us know if this persists or worsens despite having resumed her diuretic.  No episodes of bleeding. No bruising or petechiae.  No fever, chills, n/v, cough, rash, dizziness, SOB, chest pain, palpitations, abdominal pain or change sin bowel or bladder habits.  She has occasional hot flashes with Femara.  Last month, M-spike was 0.3, IgG level 273 mg/dL and lambda light chains 2.8 mg/L  No tenderness in her extremities.  She has occasional numbness and tingling in her fingertips.  No falls or syncopal episodes of report.  She has maintained a good appetite and is staying well hydrated. Her weight is stable.   ECOG Performance Status: 1 - Symptomatic but completely ambulatory  Medications:  Allergies as of 05/30/2019      Reactions   Codeine Nausea Only      Medication List       Accurate as of May 30, 2019 11:29 AM. If you have any questions, ask your nurse or  doctor.        acyclovir 400 MG tablet Commonly known as: ZOVIRAX TAKE 1 TABLET(400 MG) BY MOUTH TWICE DAILY   albuterol 108 (90 Base) MCG/ACT inhaler Commonly known as: VENTOLIN HFA Inhale 2 puffs into the lungs every 6 (six) hours as needed for wheezing or shortness of breath. 2 puffs 3 times daily x 5 days then every 6 hours as needed.   amLODipine 10 MG tablet Commonly known as: NORVASC Take 1 tablet (10 mg total) by mouth every morning.   aspirin EC 81 MG tablet Take 81 mg by mouth 2 (two) times daily. Stopped  By dr Myna Hidalgo until urology procedures complete   CALCIUM 1200+D3 PO Take 1 tablet by mouth daily.   daratumumab 400 MG/20ML Commonly known as: DARZALEX Inject into the vein.   hydrochlorothiazide 12.5 MG capsule Commonly known as: MICROZIDE Take 12.5 mg by mouth every morning.   letrozole 2.5 MG tablet Commonly known as: FEMARA Take 1 tablet (2.5 mg total) by mouth daily.   loperamide 2 MG capsule Commonly known as: IMODIUM Take by mouth as needed for diarrhea or loose stools. Reported on 08/19/2015   loratadine 10 MG tablet Commonly known as: CLARITIN Take 10 mg by mouth every morning. Reported on 08/19/2015   LORazepam 0.5 MG tablet Commonly known as: Ativan Take 1 tablet (0.5 mg total) by mouth every 6 (six) hours as needed (Nausea or vomiting).   metoprolol succinate 50 MG 24 hr tablet Commonly known as: TOPROL-XL Take 50 mg by mouth every morning.  montelukast 10 MG tablet Commonly known as: SINGULAIR TAKE 1 TABLET(10 MG) BY MOUTH AT BEDTIME   Nasacort Allergy 24HR 55 MCG/ACT Aero nasal inhaler Generic drug: triamcinolone Place 2 sprays into the nose as needed.   pomalidomide 1 MG capsule Commonly known as: Pomalyst TAKE 1 CAPSULE BY MOUTH ONCE DAILY FOR 21 DAYS ON AND 7 DAYS OFFAuth#[id number]   potassium chloride SA 20 MEQ tablet Commonly known as: KLOR-CON TAKE 1 TABLET(20 MEQ) BY MOUTH TWICE DAILY   PROBIOTIC DAILY PO Take by mouth  daily.   triamterene-hydrochlorothiazide 37.5-25 MG tablet Commonly known as: MAXZIDE-25 1 tablet by mouth at bedtime   Vitamin D3 50 MCG (2000 UT) Tabs Take 2 tablets by mouth daily.       Allergies:  Allergies  Allergen Reactions  . Codeine Nausea Only    Past Medical History, Surgical history, Social history, and Family History were reviewed and updated.  Review of Systems: All other 10 point review of systems is negative.   Physical Exam:  vitals were not taken for this visit.   Wt Readings from Last 3 Encounters:  04/03/19 197 lb 0.6 oz (89.4 kg)  03/31/19 196 lb (88.9 kg)  02/27/19 193 lb (87.5 kg)    Ocular: Sclerae unicteric, pupils equal, round and reactive to light Ear-nose-throat: Oropharynx clear, dentition fair Lymphatic: No cervical or supraclavicular adenopathy Lungs no rales or rhonchi, good excursion bilaterally Heart regular rate and rhythm, no murmur appreciated Abd soft, nontender, positive bowel sounds, no liver or spleen tip palpated on exam, no fluid wave  MSK no focal spinal tenderness, no joint edema Neuro: non-focal, well-oriented, appropriate affect Breasts: Deferred   Lab Results  Component Value Date   WBC 5.5 05/30/2019   HGB 12.0 05/30/2019   HCT 36.4 05/30/2019   MCV 84.3 05/30/2019   PLT 243 05/30/2019   Lab Results  Component Value Date   FERRITIN 2,988 (H) 04/24/2019   IRON 62 04/24/2019   TIBC 283 04/24/2019   UIBC 220 04/24/2019   IRONPCTSAT 22 04/24/2019   Lab Results  Component Value Date   RETICCTPCT 2.9 (H) 10/17/2017   RBC 4.32 05/30/2019   Lab Results  Component Value Date   KPAFRELGTCHN 2.8 (L) 04/24/2019   LAMBDASER 137.0 (H) 04/24/2019   KAPLAMBRATIO 0.02 (L) 04/24/2019   Lab Results  Component Value Date   IGGSERUM 273 (L) 04/24/2019   IGGSERUM 320 (L) 04/24/2019   IGA 12 (L) 04/24/2019   IGA 16 (L) 04/24/2019   IGMSERUM <5 (L) 04/24/2019   IGMSERUM <5 (L) 04/24/2019   Lab Results  Component  Value Date   TOTALPROTELP 5.9 (L) 04/24/2019   ALBUMINELP 3.4 04/24/2019   A1GS 0.3 04/24/2019   A2GS 1.0 04/24/2019   BETS 0.9 04/24/2019   BETA2SER 0.4 11/23/2014   GAMS 0.3 (L) 04/24/2019   MSPIKE 0.1 (H) 04/24/2019   SPEI Comment 01/30/2019     Chemistry      Component Value Date/Time   NA 141 04/24/2019 1131   NA 141 01/10/2017 1115   NA 140 06/21/2016 0918   K 3.5 04/24/2019 1131   K 4.0 01/10/2017 1115   K 4.3 06/21/2016 0918   CL 107 04/24/2019 1131   CL 106 01/10/2017 1115   CO2 26 04/24/2019 1131   CO2 27 01/10/2017 1115   CO2 20 (L) 06/21/2016 0918   BUN 14 04/24/2019 1131   BUN 15 01/10/2017 1115   BUN 15.8 06/21/2016 0918   CREATININE 0.88 04/24/2019 1131  CREATININE 1.0 01/10/2017 1115   CREATININE 0.8 06/21/2016 0918      Component Value Date/Time   CALCIUM 9.0 04/24/2019 1131   CALCIUM 9.5 01/10/2017 1115   CALCIUM 9.4 06/21/2016 0918   ALKPHOS 45 04/24/2019 1131   ALKPHOS 40 01/10/2017 1115   ALKPHOS 66 06/21/2016 0918   AST 9 (L) 04/24/2019 1131   AST 17 06/21/2016 0918   ALT 14 04/24/2019 1131   ALT 19 01/10/2017 1115   ALT 37 06/21/2016 0918   BILITOT 0.6 04/24/2019 1131   BILITOT 0.32 06/21/2016 0918       Impression and Plan: Ms. Waychoff is a very pleasant 69 yo African American female with recurrent lambda light chain myeloma. Her second a stem cell transplant for light chain myeloma was in June 2016. Shealsohaslocalized recurrent endometrial cancer (followed by gyn onc Dr. Nelly Rout) and is currently on Femara.  Her light chains increased last month which was concerning. Myeloma studies were redrawn today and results are pending. If continues to increase Dr. Myna Hidalgo has already discussed adding Kyprolis to her treatment plan with Daratumumab.  We will proceed with treatment today as planned. We will see her again in a month.  She will contact our office with any questions or concerns. We can certainly see her sooner if needed.    Emeline Gins, NP 4/30/202111:29 AM

## 2019-05-30 NOTE — Addendum Note (Signed)
Addended by: Burney Gauze R on: 05/30/2019 11:58 AM   Modules accepted: Orders

## 2019-05-30 NOTE — Patient Instructions (Addendum)
Daratumumab injection What is this medicine? DARATUMUMAB (dar a toom ue mab) is a monoclonal antibody. It is used to treat multiple myeloma. This medicine may be used for other purposes; ask your health care provider or pharmacist if you have questions. COMMON BRAND NAME(S): DARZALEX What should I tell my health care provider before I take this medicine? They need to know if you have any of these conditions:  infection (especially a virus infection such as chickenpox, herpes, or hepatitis B virus)  lung or breathing disease  an unusual or allergic reaction to daratumumab, other medicines, foods, dyes, or preservatives  pregnant or trying to get pregnant  breast-feeding How should I use this medicine? This medicine is for infusion into a vein. It is given by a health care professional in a hospital or clinic setting. Talk to your pediatrician regarding the use of this medicine in children. Special care may be needed. Overdosage: If you think you have taken too much of this medicine contact a poison control center or emergency room at once. NOTE: This medicine is only for you. Do not share this medicine with others. What if I miss a dose? Keep appointments for follow-up doses as directed. It is important not to miss your dose. Call your doctor or health care professional if you are unable to keep an appointment. What may interact with this medicine? Interactions have not been studied. This list may not describe all possible interactions. Give your health care provider a list of all the medicines, herbs, non-prescription drugs, or dietary supplements you use. Also tell them if you smoke, drink alcohol, or use illegal drugs. Some items may interact with your medicine. What should I watch for while using this medicine? This drug may make you feel generally unwell. Report any side effects. Continue your course of treatment even though you feel ill unless your doctor tells you to stop. This  medicine can cause serious allergic reactions. To reduce your risk you may need to take medicine before treatment with this medicine. Take your medicine as directed. This medicine can affect the results of blood tests to match your blood type. These changes can last for up to 6 months after the final dose. Your healthcare provider will do blood tests to match your blood type before you start treatment. Tell all of your healthcare providers that you are being treated with this medicine before receiving a blood transfusion. This medicine can affect the results of some tests used to determine treatment response; extra tests may be needed to evaluate response. Do not become pregnant while taking this medicine or for 3 months after stopping it. Women should inform their doctor if they wish to become pregnant or think they might be pregnant. There is a potential for serious side effects to an unborn child. Talk to your health care professional or pharmacist for more information. What side effects may I notice from receiving this medicine? Side effects that you should report to your doctor or health care professional as soon as possible:  allergic reactions like skin rash, itching or hives, swelling of the face, lips, or tongue  breathing problems  chills  cough  dizziness  feeling faint or lightheaded  headache  low blood counts - this medicine may decrease the number of white blood cells, red blood cells and platelets. You may be at increased risk for infections and bleeding.  nausea, vomiting  shortness of breath  signs of decreased platelets or bleeding - bruising, pinpoint red spots on  the skin, black, tarry stools, blood in the urine  signs of decreased red blood cells - unusually weak or tired, feeling faint or lightheaded, falls  signs of infection - fever or chills, cough, sore throat, pain or difficulty passing urine  signs and symptoms of liver injury like dark yellow or brown  urine; general ill feeling or flu-like symptoms; light-colored stools; loss of appetite; right upper belly pain; unusually weak or tired; yellowing of the eyes or skin Side effects that usually do not require medical attention (report to your doctor or health care professional if they continue or are bothersome):  back pain  constipation  diarrhea  joint pain  muscle cramps  pain, tingling, numbness in the hands or feet  swelling of the ankles, feet, hands  tiredness  trouble sleeping This list may not describe all possible side effects. Call your doctor for medical advice about side effects. You may report side effects to FDA at 1-800-FDA-1088. Where should I keep my medicine? This drug is given in a hospital or clinic and will not be stored at home. NOTE: This sheet is a summary. It may not cover all possible information. If you have questions about this medicine, talk to your doctor, pharmacist, or health care provider.  2020 Elsevier/Gold Standard (2018-09-24 18:10:54) Denosumab injection What is this medicine? DENOSUMAB (den oh sue mab) slows bone breakdown. Prolia is used to treat osteoporosis in women after menopause and in men, and in people who are taking corticosteroids for 6 months or more. Xgeva is used to treat a high calcium level due to cancer and to prevent bone fractures and other bone problems caused by multiple myeloma or cancer bone metastases. Xgeva is also used to treat giant cell tumor of the bone. This medicine may be used for other purposes; ask your health care provider or pharmacist if you have questions. COMMON BRAND NAME(S): Prolia, XGEVA What should I tell my health care provider before I take this medicine? They need to know if you have any of these conditions:  dental disease  having surgery or tooth extraction  infection  kidney disease  low levels of calcium or Vitamin D in the blood  malnutrition  on hemodialysis  skin conditions or  sensitivity  thyroid or parathyroid disease  an unusual reaction to denosumab, other medicines, foods, dyes, or preservatives  pregnant or trying to get pregnant  breast-feeding How should I use this medicine? This medicine is for injection under the skin. It is given by a health care professional in a hospital or clinic setting. A special MedGuide will be given to you before each treatment. Be sure to read this information carefully each time. For Prolia, talk to your pediatrician regarding the use of this medicine in children. Special care may be needed. For Xgeva, talk to your pediatrician regarding the use of this medicine in children. While this drug may be prescribed for children as young as 13 years for selected conditions, precautions do apply. Overdosage: If you think you have taken too much of this medicine contact a poison control center or emergency room at once. NOTE: This medicine is only for you. Do not share this medicine with others. What if I miss a dose? It is important not to miss your dose. Call your doctor or health care professional if you are unable to keep an appointment. What may interact with this medicine? Do not take this medicine with any of the following medications:  other medicines containing denosumab This   medicine may also interact with the following medications:  medicines that lower your chance of fighting infection  steroid medicines like prednisone or cortisone This list may not describe all possible interactions. Give your health care provider a list of all the medicines, herbs, non-prescription drugs, or dietary supplements you use. Also tell them if you smoke, drink alcohol, or use illegal drugs. Some items may interact with your medicine. What should I watch for while using this medicine? Visit your doctor or health care professional for regular checks on your progress. Your doctor or health care professional may order blood tests and other tests  to see how you are doing. Call your doctor or health care professional for advice if you get a fever, chills or sore throat, or other symptoms of a cold or flu. Do not treat yourself. This drug may decrease your body's ability to fight infection. Try to avoid being around people who are sick. You should make sure you get enough calcium and vitamin D while you are taking this medicine, unless your doctor tells you not to. Discuss the foods you eat and the vitamins you take with your health care professional. See your dentist regularly. Brush and floss your teeth as directed. Before you have any dental work done, tell your dentist you are receiving this medicine. Do not become pregnant while taking this medicine or for 5 months after stopping it. Talk with your doctor or health care professional about your birth control options while taking this medicine. Women should inform their doctor if they wish to become pregnant or think they might be pregnant. There is a potential for serious side effects to an unborn child. Talk to your health care professional or pharmacist for more information. What side effects may I notice from receiving this medicine? Side effects that you should report to your doctor or health care professional as soon as possible:  allergic reactions like skin rash, itching or hives, swelling of the face, lips, or tongue  bone pain  breathing problems  dizziness  jaw pain, especially after dental work  redness, blistering, peeling of the skin  signs and symptoms of infection like fever or chills; cough; sore throat; pain or trouble passing urine  signs of low calcium like fast heartbeat, muscle cramps or muscle pain; pain, tingling, numbness in the hands or feet; seizures  unusual bleeding or bruising  unusually weak or tired Side effects that usually do not require medical attention (report to your doctor or health care professional if they continue or are  bothersome):  constipation  diarrhea  headache  joint pain  loss of appetite  muscle pain  runny nose  tiredness  upset stomach This list may not describe all possible side effects. Call your doctor for medical advice about side effects. You may report side effects to FDA at 1-800-FDA-1088. Where should I keep my medicine? This medicine is only given in a clinic, doctor's office, or other health care setting and will not be stored at home. NOTE: This sheet is a summary. It may not cover all possible information. If you have questions about this medicine, talk to your doctor, pharmacist, or health care provider.  2020 Elsevier/Gold Standard (2017-05-25 16:10:44)  

## 2019-05-31 LAB — IGG, IGA, IGM
IgA: 18 mg/dL — ABNORMAL LOW (ref 87–352)
IgG (Immunoglobin G), Serum: 360 mg/dL — ABNORMAL LOW (ref 586–1602)
IgM (Immunoglobulin M), Srm: <5 mg/dL — ABNORMAL LOW (ref 26–217)

## 2019-06-02 LAB — PROTEIN ELECTROPHORESIS, SERUM, WITH REFLEX
A/G Ratio: 1.7 (ref 0.7–1.7)
Albumin ELP: 4.1 g/dL (ref 2.9–4.4)
Alpha-1-Globulin: 0.2 g/dL (ref 0.0–0.4)
Alpha-2-Globulin: 0.8 g/dL (ref 0.4–1.0)
Beta Globulin: 1 g/dL (ref 0.7–1.3)
Gamma Globulin: 0.3 g/dL — ABNORMAL LOW (ref 0.4–1.8)
Globulin, Total: 2.4 g/dL (ref 2.2–3.9)
Total Protein ELP: 6.5 g/dL (ref 6.0–8.5)

## 2019-06-02 LAB — IRON AND TIBC
Iron: 99 ug/dL (ref 41–142)
Saturation Ratios: 27 % (ref 21–57)
TIBC: 361 ug/dL (ref 236–444)
UIBC: 263 ug/dL (ref 120–384)

## 2019-06-02 LAB — FERRITIN: Ferritin: 2370 ng/mL — ABNORMAL HIGH (ref 11–307)

## 2019-06-02 LAB — KAPPA/LAMBDA LIGHT CHAINS
Kappa free light chain: 5.5 mg/L (ref 3.3–19.4)
Kappa, lambda light chain ratio: 0.04 — ABNORMAL LOW (ref 0.26–1.65)
Lambda free light chains: 129.8 mg/L — ABNORMAL HIGH (ref 5.7–26.3)

## 2019-06-03 ENCOUNTER — Other Ambulatory Visit: Payer: Self-pay | Admitting: *Deleted

## 2019-06-03 ENCOUNTER — Other Ambulatory Visit: Payer: Self-pay | Admitting: Hematology & Oncology

## 2019-06-03 DIAGNOSIS — C9000 Multiple myeloma not having achieved remission: Secondary | ICD-10-CM

## 2019-06-03 MED ORDER — POMALIDOMIDE 1 MG PO CAPS: ORAL_CAPSULE | ORAL | 0 refills | Status: DC

## 2019-06-11 ENCOUNTER — Ambulatory Visit (INDEPENDENT_AMBULATORY_CARE_PROVIDER_SITE_OTHER): Payer: Medicare Other

## 2019-06-11 ENCOUNTER — Other Ambulatory Visit: Payer: Self-pay

## 2019-06-11 ENCOUNTER — Telehealth: Payer: Medicare Other

## 2019-06-11 DIAGNOSIS — C9000 Multiple myeloma not having achieved remission: Secondary | ICD-10-CM

## 2019-06-11 DIAGNOSIS — I10 Essential (primary) hypertension: Secondary | ICD-10-CM

## 2019-06-11 DIAGNOSIS — E6609 Other obesity due to excess calories: Secondary | ICD-10-CM

## 2019-06-13 NOTE — Patient Instructions (Signed)
Visit Information  Goals Addressed          This Visit's Progress     Patient Stated   .       Current Barriers:  Emily Greer Kitchen Knowledge Deficits related to Multiple Myeloma . Chronic Disease Management support and education needs related to Multiple Myeloma; HTN , Class 1 Obesity   Nurse Case Manager Clinical Goal(s):  Emily Greer Kitchen Over the next 90 days, patient will work with the CCM team to address needs related to disease management and support for Multiple Myeloma  CCM RN CM Interventions:  06/12/19 . Evaluation of current treatment plan related to Multiple Myeloma and patient's adherence to plan as established by provider . Determined patient feels her Cancer treatment is going well with Dr. Marin Olp, Oncologist managing this illness . Determined patient's current Cancer plan includes;  . Daratumumab/Pomalyst (1 mg) - status post cycle #13 . Femara 2.5 mg po q day  . IV iron as indicated . DENOSUMAB (XGEVA) Q3MOS & FERUMOXYTOL (FERAHEME) Q7D & PORTACATH FLUSH Q6WK . Assessed for hydration and nutrition status; patient is able to tolerate a regular diet and feels she is staying well hydrated . Encouraged patient to balance her activity with rest and to notify her Oncology team of any status change promptly . Discussed plans with patient for ongoing care management follow up and provided patient with direct contact information for care management team  Patient Self Care Activities:  . Self administers medications as prescribed . Attends all scheduled provider appointments . Calls pharmacy for medication refills . Performs ADL's independently . Performs IADL's independently . Calls provider office for new concerns or questions  Please see past updates related to this goal by clicking on the "Past Updates" button in the selected goal      .       Current Barriers:  Emily Greer Kitchen Knowledge Deficits related to disease process and Self health management of Hypertension . Chronic Disease Management support  and education needs related to HTN, Multiple Myeloma, Class 1 Obesity  Nurse Case Manager Clinical Goal(s):  Emily Greer Kitchen Over the next 90 days, patient will work with the [facility] CM and PCP to address needs related to Hypertension  CM RN CM Interventions:  06/12/19 call completed with patient  . Evaluation of current treatment plan related to Hypertension and patient's adherence to plan as established by provider. . Reinforced target BP <130/80; Reviewed and discussed patient's most recent BP readings 137/77; 135/72; 134/72; Determined patient has a BP cuff at home and feels comfortable Self monitoring her BP . Determined patient has concerns about persistent edema noted to her RLE with ankle tenderness, she denies injury to this extremity . Reviewed medications with patient and discussed indication, dosage and frequency of prescribed antihypertensive medications; patient is questioning a medication discrepancy with her Toprol XL; Determined Emily Greer is not taking this medication and is unsure if it was discontinued . Sent secure message to PCP Dr. Baird Cancer to report patient's concerns related to RLE edema and her question about whether or not she should be taking Toprol XL; Reply received by Dr. Baird Cancer instructing patient to go an Urgent Care for evaluation of her RLE edema and or she may schedule a follow up with PCP provider Minette Brine, FNP next week if desired due to Dr. Baird Cancer will be out of the office  . Scheduled patient a f/u OV with Dr. Baird Cancer upon her return to the office per patient's request; Reviewed appointment is scheduled for 07/01/19 '@8'$ :45AM .  Discussed plans with patient for ongoing care management follow up and provided patient with direct contact information for care management team  Patient Self Care Activities:  . Self administers medications as prescribed . Attends all scheduled provider appointments . Calls pharmacy for medication refills . Performs ADL's  independently . Performs IADL's independently . Calls provider office for new concerns or questions  Please see past updates related to this goal by clicking on the "Past Updates" button in the selected goal        Patient verbalizes understanding of instructions provided today.   Telephone follow up appointment with care management team member scheduled for: 07/15/19  Barb Merino, RN, BSN, CCM Care Management Coordinator [facility]  Direct Phone: [phone]

## 2019-06-13 NOTE — Chronic Care Management (AMB) (Signed)
Chronic Care Management   Follow Up Note   06/12/2019 Name: Emily Greer MRN: [id number] DOB: [birth year]  Referred by: [name], MD Reason for referral : Chronic Care Management ([facility] Initial RN Call )   Emily Greer is a 69 y.o. year old female who is a primary care patient of [name], MD. The CCM team was consulted for assistance with chronic disease management and care coordination needs.    Review of patient status, including review of consultants reports, relevant laboratory and other test results, and collaboration with appropriate care team members and the patient's provider was performed as part of comprehensive patient evaluation and provision of chronic care management services.    SDOH (Social Determinants of Health) assessments performed: Yes - No Acute Challenges Identified at this time See Care Plan activities for detailed interventions related to SDOH)   Placed outbound CCM RN CM follow up call to patient to assess for CCM needs and review of HTN and CA treatment.  Outpatient Encounter Medications as of 06/11/2019  Medication Sig  . amLODipine (NORVASC) 10 MG tablet Take 1 tablet (10 mg total) by mouth every morning.  . triamterene-hydrochlorothiazide (MAXZIDE-25) 37.5-25 MG tablet 1 tablet by mouth at bedtime  . acyclovir (ZOVIRAX) 400 MG tablet TAKE 1 TABLET(400 MG) BY MOUTH TWICE DAILY  . albuterol (VENTOLIN HFA) 108 (90 Base) MCG/ACT inhaler Inhale 2 puffs into the lungs every 6 (six) hours as needed for wheezing or shortness of breath. 2 puffs 3 times daily x 5 days then every 6 hours as needed.  [name] Kitchen aspirin EC 81 MG tablet Take 81 mg by mouth 2 (two) times daily. Stopped  By dr Myna Hidalgo until urology procedures complete  . Calcium-Magnesium-Vitamin D (CALCIUM 1200+D3 PO) Take 1 tablet by mouth daily.  . Cholecalciferol (VITAMIN D3) 2000 UNITS TABS Take 2 tablets by mouth daily.   . daratumumab (DARZALEX) 400 MG/20ML Inject into the vein.  .  hydrochlorothiazide (MICROZIDE) 12.5 MG capsule Take 12.5 mg by mouth every morning.  [name] Kitchen letrozole (FEMARA) 2.5 MG tablet Take 1 tablet (2.5 mg total) by mouth daily.  [name] Kitchen loperamide (IMODIUM) 2 MG capsule Take by mouth as needed for diarrhea or loose stools. Reported on 08/19/2015  . loratadine (CLARITIN) 10 MG tablet Take 10 mg by mouth every morning. Reported on 08/19/2015  . LORazepam (ATIVAN) 0.5 MG tablet Take 1 tablet (0.5 mg total) by mouth every 6 (six) hours as needed (Nausea or vomiting).  . metoprolol succinate (TOPROL-XL) 50 MG 24 hr tablet Take 50 mg by mouth every morning.  . montelukast (SINGULAIR) 10 MG tablet TAKE 1 TABLET(10 MG) BY MOUTH AT BEDTIME  . pomalidomide (POMALYST) 1 MG capsule TAKE 1 CAPSULE BY MOUTH ONCE DAILY FOR 21 DAYS ON AND 7 DAYS OFF-Auth#[id number]  . potassium chloride SA (K-DUR) 20 MEQ tablet TAKE 1 TABLET(20 MEQ) BY MOUTH TWICE DAILY  . Probiotic Product (PROBIOTIC DAILY PO) Take by mouth daily.  [name] Kitchen triamcinolone (NASACORT ALLERGY 24HR) 55 MCG/ACT AERO nasal inhaler Place 2 sprays into the nose as needed.    Facility-Administered Encounter Medications as of 06/11/2019  Medication  . sodium chloride flush (NS) 0.9 % injection 10 mL  . sodium chloride flush (NS) 0.9 % injection 10 mL     Objective:  Lab Results  Component Value Date   HGBA1C 5.7 (H) 06/01/2014   HGBA1C 5.9 03/22/2007   Lab Results  Component Value Date   LDLCALC 164 (H) 03/22/2007   CREATININE 1.09 (H) 05/30/2019  BP Readings from Last 3 Encounters:  05/30/19 136/78  05/30/19 (!) 148/83  05/01/19 (!) 151/75    Goals Addressed      Patient Stated   . "My Cancer treatment is going well" (pt-stated)       Current Barriers:  [name] Kitchen Knowledge Deficits related to Multiple Myeloma . Chronic Disease Management support and education needs related to Multiple Myeloma; HTN , Class 1 Obesity   Nurse Case Manager Clinical Goal(s):  [name] Kitchen Over the next 90 days, patient will work with the CCM team to  address needs related to disease management and support for Multiple Myeloma  CCM RN CM Interventions:  06/12/19 . Evaluation of current treatment plan related to Multiple Myeloma and patient's adherence to plan as established by provider . Determined patient feels her Cancer treatment is going well with Dr. Myna Hidalgo, Oncologist managing this illness . Determined patient's current Cancer plan includes;  . Daratumumab/Pomalyst (1 mg) - status post cycle #13 . Femara 2.5 mg po q day  . IV iron as indicated . DENOSUMAB (XGEVA) Q3MOS & FERUMOXYTOL (FERAHEME) Q7D & PORTACATH FLUSH Q6WK . Assessed for hydration and nutrition status; patient is able to tolerate a regular diet and feels she is staying well hydrated . Encouraged patient to balance her activity with rest and to notify her Oncology team of any status change promptly . Discussed plans with patient for ongoing care management follow up and provided patient with direct contact information for care management team  Patient Self Care Activities:  . Self administers medications as prescribed . Attends all scheduled provider appointments . Calls pharmacy for medication refills . Performs ADL's independently . Performs IADL's independently . Calls provider office for new concerns or questions  Please see past updates related to this goal by clicking on the "Past Updates" button in the selected goal     . "to keep my BP under good control" (pt-stated)       Current Barriers:  [name] Kitchen Knowledge Deficits related to disease process and Self health management of Hypertension . Chronic Disease Management support and education needs related to HTN, Multiple Myeloma, Class 1 Obesity  Nurse Case Manager Clinical Goal(s):  [name] Kitchen Over the next 90 days, patient will work with the CCM RN CM and PCP to address needs related to Hypertension  CM RN CM Interventions:  06/12/19 call completed with patient  . Evaluation of current treatment plan related to  Hypertension and patient's adherence to plan as established by provider. . Reinforced target BP <130/80; Reviewed and discussed patient's most recent BP readings 137/77; 135/72; 134/72; Determined patient has a BP cuff at home and feels comfortable Self monitoring her BP . Determined patient has concerns about persistent edema noted to her RLE with ankle tenderness, she denies injury to this extremity . Reviewed medications with patient and discussed indication, dosage and frequency of prescribed antihypertensive medications; patient is questioning a medication discrepancy with her Toprol XL; Determined Emily Greer is not taking this medication and is unsure if it was discontinued . Sent secure message to PCP Dr. Allyne Gee to report patient's concerns related to RLE edema and her question about whether or not she should be taking Toprol XL; Reply received by Dr. Allyne Gee instructing patient to go an Urgent Care for evaluation of her RLE edema and or she may schedule a follow up with PCP provider Arnette Felts, FNP next week if desired due to Dr. Allyne Gee will be out of the office  . Scheduled patient a f/u  OV with Dr. Allyne Gee upon her return to the office per patient's request; Reviewed appointment is scheduled for 07/01/19 @8 :45AM . Discussed plans with patient for ongoing care management follow up and provided patient with direct contact information for care management team  Patient Self Care Activities:  . Self administers medications as prescribed . Attends all scheduled provider appointments . Calls pharmacy for medication refills . Performs ADL's independently . Performs IADL's independently . Calls provider office for new concerns or questions  Please see past updates related to this goal by clicking on the "Past Updates" button in the selected goal        Plan:   Telephone follow up appointment with care management team member scheduled for: 07/16/19  Delsa Sale, RN, BSN, CCM Care  Management Coordinator [facility]  Direct Phone: [phone]

## 2019-06-19 ENCOUNTER — Ambulatory Visit: Payer: Medicare Other

## 2019-06-19 ENCOUNTER — Other Ambulatory Visit: Payer: Medicare Other

## 2019-06-19 ENCOUNTER — Ambulatory Visit: Payer: Medicare Other | Admitting: Family

## 2019-06-19 NOTE — Progress Notes (Signed)
Pharmacist Chemotherapy Monitoring - Follow Up Assessment    I verify that I have reviewed each item in the below checklist:  . Regimen for the patient is scheduled for the appropriate day and plan matches scheduled date. [name] Kitchen Appropriate non-routine labs are ordered dependent on drug ordered. . If applicable, additional medications reviewed and ordered per protocol based on lifetime cumulative doses and/or treatment regimen.   Plan for follow-up and/or issues identified: Yes . I-vent associated with next due treatment: Yes . MD and/or nursing notified: No  Halcomb, Jacqlyn Larsen 06/19/2019 1:45 PM

## 2019-06-26 ENCOUNTER — Inpatient Hospital Stay: Payer: Medicare Other | Attending: Hematology & Oncology | Admitting: Hematology & Oncology

## 2019-06-26 ENCOUNTER — Inpatient Hospital Stay: Payer: Medicare Other

## 2019-06-26 ENCOUNTER — Other Ambulatory Visit: Payer: Self-pay

## 2019-06-26 ENCOUNTER — Encounter: Payer: Self-pay | Admitting: Hematology & Oncology

## 2019-06-26 VITALS — Wt 195.1 lb

## 2019-06-26 VITALS — BP 159/72 | HR 98 | Temp 97.7°F | Resp 17

## 2019-06-26 DIAGNOSIS — Z5112 Encounter for antineoplastic immunotherapy: Secondary | ICD-10-CM | POA: Insufficient documentation

## 2019-06-26 DIAGNOSIS — M7989 Other specified soft tissue disorders: Secondary | ICD-10-CM | POA: Diagnosis not present

## 2019-06-26 DIAGNOSIS — D500 Iron deficiency anemia secondary to blood loss (chronic): Secondary | ICD-10-CM

## 2019-06-26 DIAGNOSIS — C9000 Multiple myeloma not having achieved remission: Secondary | ICD-10-CM | POA: Diagnosis not present

## 2019-06-26 DIAGNOSIS — C541 Malignant neoplasm of endometrium: Secondary | ICD-10-CM | POA: Insufficient documentation

## 2019-06-26 DIAGNOSIS — Z923 Personal history of irradiation: Secondary | ICD-10-CM | POA: Insufficient documentation

## 2019-06-26 DIAGNOSIS — Z95828 Presence of other vascular implants and grafts: Secondary | ICD-10-CM

## 2019-06-26 DIAGNOSIS — Z885 Allergy status to narcotic agent status: Secondary | ICD-10-CM | POA: Insufficient documentation

## 2019-06-26 DIAGNOSIS — C9001 Multiple myeloma in remission: Secondary | ICD-10-CM

## 2019-06-26 DIAGNOSIS — Z79811 Long term (current) use of aromatase inhibitors: Secondary | ICD-10-CM | POA: Insufficient documentation

## 2019-06-26 DIAGNOSIS — D509 Iron deficiency anemia, unspecified: Secondary | ICD-10-CM | POA: Diagnosis not present

## 2019-06-26 LAB — CMP (CANCER CENTER ONLY)
ALT: 12 U/L (ref 0–44)
AST: 10 U/L — ABNORMAL LOW (ref 15–41)
Albumin: 4.3 g/dL (ref 3.5–5.0)
Alkaline Phosphatase: 41 U/L (ref 38–126)
Anion gap: 8 (ref 5–15)
BUN: 16 mg/dL (ref 8–23)
CO2: 27 mmol/L (ref 22–32)
Calcium: 9.6 mg/dL (ref 8.9–10.3)
Chloride: 106 mmol/L (ref 98–111)
Creatinine: 0.86 mg/dL (ref 0.44–1.00)
GFR, Est AFR Am: >60 mL/min (ref >60)
GFR, Estimated: >60 mL/min (ref >60)
Glucose, Bld: 104 mg/dL — ABNORMAL HIGH (ref 70–99)
Potassium: 3.3 mmol/L — ABNORMAL LOW (ref 3.5–5.1)
Sodium: 141 mmol/L (ref 135–145)
Total Bilirubin: 0.5 mg/dL (ref 0.3–1.2)
Total Protein: 6.6 g/dL (ref 6.5–8.1)

## 2019-06-26 LAB — CBC WITH DIFFERENTIAL (CANCER CENTER ONLY)
Abs Immature Granulocytes: 0.08 10*3/uL — ABNORMAL HIGH (ref 0.00–0.07)
Basophils Absolute: 0.1 10*3/uL (ref 0.0–0.1)
Basophils Relative: 2 %
Eosinophils Absolute: 0.1 10*3/uL (ref 0.0–0.5)
Eosinophils Relative: 2 %
HCT: 31.1 % — ABNORMAL LOW (ref 36.0–46.0)
Hemoglobin: 10.1 g/dL — ABNORMAL LOW (ref 12.0–15.0)
Immature Granulocytes: 1 %
Lymphocytes Relative: 13 %
Lymphs Abs: 0.9 10*3/uL (ref 0.7–4.0)
MCH: 27.9 pg (ref 26.0–34.0)
MCHC: 32.5 g/dL (ref 30.0–36.0)
MCV: 85.9 fL (ref 80.0–100.0)
Monocytes Absolute: 1.3 10*3/uL — ABNORMAL HIGH (ref 0.1–1.0)
Monocytes Relative: 19 %
Neutro Abs: 4.3 10*3/uL (ref 1.7–7.7)
Neutrophils Relative %: 63 %
Platelet Count: 201 10*3/uL (ref 150–400)
RBC: 3.62 MIL/uL — ABNORMAL LOW (ref 3.87–5.11)
RDW: 14.9 % (ref 11.5–15.5)
WBC Count: 6.8 10*3/uL (ref 4.0–10.5)
nRBC: 0 % (ref 0.0–0.2)

## 2019-06-26 LAB — RETICULOCYTES
Immature Retic Fract: 10.6 % (ref 2.3–15.9)
RBC.: 3.6 MIL/uL — ABNORMAL LOW (ref 3.87–5.11)
Retic Count, Absolute: 49 10*3/uL (ref 19.0–186.0)
Retic Ct Pct: 1.4 % (ref 0.4–3.1)

## 2019-06-26 MED ORDER — ACETAMINOPHEN 325 MG PO TABS
650.0000 mg | ORAL_TABLET | Freq: Once | ORAL | Status: AC
  Administered 2019-06-26: 650 mg via ORAL

## 2019-06-26 MED ORDER — DEXAMETHASONE 4 MG PO TABS
20.0000 mg | ORAL_TABLET | Freq: Once | ORAL | Status: AC
  Administered 2019-06-26: 20 mg via ORAL

## 2019-06-26 MED ORDER — DEXAMETHASONE 4 MG PO TABS
ORAL_TABLET | ORAL | Status: AC
  Filled 2019-06-26: qty 5

## 2019-06-26 MED ORDER — DIPHENHYDRAMINE HCL 25 MG PO CAPS
ORAL_CAPSULE | ORAL | Status: AC
  Filled 2019-06-26: qty 2

## 2019-06-26 MED ORDER — DIPHENHYDRAMINE HCL 25 MG PO CAPS
50.0000 mg | ORAL_CAPSULE | Freq: Once | ORAL | Status: AC
  Administered 2019-06-26: 50 mg via ORAL

## 2019-06-26 MED ORDER — SODIUM CHLORIDE 0.9% FLUSH
10.0000 mL | Freq: Once | INTRAVENOUS | Status: AC
  Administered 2019-06-26: 10 mL via INTRAVENOUS
  Filled 2019-06-26: qty 10

## 2019-06-26 MED ORDER — METOPROLOL SUCCINATE ER 50 MG PO TB24: 50.0000 mg | ORAL_TABLET | Freq: Every morning | ORAL | 3 refills | Status: DC

## 2019-06-26 MED ORDER — DARATUMUMAB-HYALURONIDASE-FIHJ 1800-30000 MG-UT/15ML ~~LOC~~ SOLN
1800.0000 mg | Freq: Once | SUBCUTANEOUS | Status: AC
  Administered 2019-06-26: 1800 mg via SUBCUTANEOUS
  Filled 2019-06-26: qty 15

## 2019-06-26 MED ORDER — ACETAMINOPHEN 325 MG PO TABS
ORAL_TABLET | ORAL | Status: AC
  Filled 2019-06-26: qty 2

## 2019-06-26 NOTE — Progress Notes (Signed)
Hematology and Oncology Follow Up Visit  LETISHA AUSMUS [id number] 1950/09/11 69 y.o. 06/26/2019   Principle Diagnosis:  Recurrent lambda light chain myeloma History of recurrent endometrial carcinoma Iron deficiency anemia-blood loss  PastTherapy: Status post second autologous stem cell transplant on 07/24/2014 Maintenance therapy with Pomalidomide/every 2 week Velcade - d/c'ed Radiation therapy for endometrial recurrence - completed 04/20/2015 Pomalyst/Kyprolis 70mg /m2 IV q 2 weeks - s/p cycle #12 - held on 12/26/2017 for hematuria  Current Therapy:        Daratumumab/Pomalyst (1 mg) - status post cycle#18 Femara 2.5 mg po q day Xgeva 120 mg subcu every 3 months-next dose in 07/2019 IV iron as indicated   Interim History:  Ms. Koelsch is here today for follow-up and treatment.  As expected, she and her husband just got back from [place].  She goes down there to help her mom.  Her mother is in her 75s.  Thankfully, her mom is still quite independent.  Ms. Satkowiak is feeling okay.  She really has had no specific complaints.  Think she is doing okay with the daratumumab/pomalidomide combination.  When we last saw her back in late April, there was no monoclonal spike in her blood.  I know she has the  lambda light chain myeloma.  Her lambda light chain of the blood was stable at 13 mg/dL.  She is eating okay.  She is having no problems with nausea or vomiting.  She is doing well on the Femara.  She has had no issues with respect to hematuria or bleeding.  She has had no fever.  She has had no headache.  For some reason, she is not on the Toprol-XL.  She really needs to be on Toprol-XL.  Her pulse is 98.  Her blood pressure is up a little bit.  I will have to send in the prescription for her.  I also told her that she just needs to take 1 baby aspirin a day.  Overall, I would say her performance status is ECOG 1.  Medications:  Allergies as of  06/26/2019      Reactions   Codeine Nausea Only      Medication List       Accurate as of Jun 26, 2019 11:31 AM. If you have any questions, ask your nurse or doctor.        acyclovir 400 MG tablet Commonly known as: ZOVIRAX TAKE 1 TABLET(400 MG) BY MOUTH TWICE DAILY   albuterol 108 (90 Base) MCG/ACT inhaler Commonly known as: VENTOLIN HFA Inhale 2 puffs into the lungs every 6 (six) hours as needed for wheezing or shortness of breath. 2 puffs 3 times daily x 5 days then every 6 hours as needed.   amLODipine 10 MG tablet Commonly known as: NORVASC Take 1 tablet (10 mg total) by mouth every morning.   aspirin EC 81 MG tablet Take 81 mg by mouth 2 (two) times daily. Stopped  By dr Myna Hidalgo until urology procedures complete   CALCIUM 1200+D3 PO Take 1 tablet by mouth daily.   daratumumab 400 MG/20ML Commonly known as: DARZALEX Inject into the vein.   hydrochlorothiazide 12.5 MG capsule Commonly known as: MICROZIDE Take 12.5 mg by mouth every morning.   letrozole 2.5 MG tablet Commonly known as: FEMARA Take 1 tablet (2.5 mg total) by mouth daily.   loperamide 2 MG capsule Commonly known as: IMODIUM Take by mouth as needed for diarrhea or loose stools. Reported on 08/19/2015   loratadine 10 MG  tablet Commonly known as: CLARITIN Take 10 mg by mouth every morning. Reported on 08/19/2015   LORazepam 0.5 MG tablet Commonly known as: Ativan Take 1 tablet (0.5 mg total) by mouth every 6 (six) hours as needed (Nausea or vomiting).   metoprolol succinate 50 MG 24 hr tablet Commonly known as: TOPROL-XL Take 50 mg by mouth every morning.   montelukast 10 MG tablet Commonly known as: SINGULAIR TAKE 1 TABLET(10 MG) BY MOUTH AT BEDTIME   Nasacort Allergy 24HR 55 MCG/ACT Aero nasal inhaler Generic drug: triamcinolone Place 2 sprays into the nose as needed.   pomalidomide 1 MG capsule Commonly known as: Pomalyst TAKE 1 CAPSULE BY MOUTH ONCE DAILY FOR 21 DAYS ON AND 7 DAYS  OFF-Auth#[id number]   potassium chloride SA 20 MEQ tablet Commonly known as: KLOR-CON TAKE 1 TABLET(20 MEQ) BY MOUTH TWICE DAILY   PROBIOTIC DAILY PO Take by mouth daily.   triamterene-hydrochlorothiazide 37.5-25 MG tablet Commonly known as: MAXZIDE-25 1 tablet by mouth at bedtime   Vitamin D3 50 MCG (2000 UT) Tabs Take 2 tablets by mouth daily.       Allergies:  Allergies  Allergen Reactions  . Codeine Nausea Only    Past Medical History, Surgical history, Social history, and Family History were reviewed and updated.  Review of Systems: Review of Systems  Constitutional: Negative.   HENT: Negative.   Eyes: Negative.   Respiratory: Negative.   Cardiovascular: Positive for leg swelling.  Gastrointestinal: Negative.   Genitourinary: Negative.   Musculoskeletal: Negative.   Skin: Negative.   Neurological: Negative.   Endo/Heme/Allergies: Negative.   Psychiatric/Behavioral: Negative.      Physical Exam:  weight is 195 lb 1.9 oz (88.5 kg).   Wt Readings from Last 3 Encounters:  06/26/19 195 lb 1.9 oz (88.5 kg)  05/30/19 194 lb 1.3 oz (88 kg)  04/03/19 197 lb 0.6 oz (89.4 kg)    Her vital signs show temperature of 97.7.  Pulse 98.  Blood pressure 159/72.  Weight is 195 pounds.  Physical Exam Vitals reviewed.  HENT:     Head: Normocephalic and atraumatic.  Eyes:     Pupils: Pupils are equal, round, and reactive to light.  Cardiovascular:     Rate and Rhythm: Normal rate and regular rhythm.     Heart sounds: Normal heart sounds.  Pulmonary:     Effort: Pulmonary effort is normal.     Breath sounds: Normal breath sounds.  Abdominal:     General: Bowel sounds are normal.     Palpations: Abdomen is soft.  Musculoskeletal:        General: No tenderness or deformity. Normal range of motion.     Cervical back: Normal range of motion.  Lymphadenopathy:     Cervical: No cervical adenopathy.  Skin:    General: Skin is warm and dry.     Findings: No erythema or  rash.  Neurological:     Mental Status: She is alert and oriented to person, place, and time.  Psychiatric:        Behavior: Behavior normal.        Thought Content: Thought content normal.        Judgment: Judgment normal.      Lab Results  Component Value Date   WBC 6.8 06/26/2019   HGB 10.1 (L) 06/26/2019   HCT 31.1 (L) 06/26/2019   MCV 85.9 06/26/2019   PLT 201 06/26/2019   Lab Results  Component Value Date   FERRITIN 2,370 (  H) 05/30/2019   IRON 99 05/30/2019   TIBC 361 05/30/2019   UIBC 263 05/30/2019   IRONPCTSAT 27 05/30/2019   Lab Results  Component Value Date   RETICCTPCT 1.4 06/26/2019   RBC 3.60 (L) 06/26/2019   Lab Results  Component Value Date   KPAFRELGTCHN 5.5 05/30/2019   LAMBDASER 129.8 (H) 05/30/2019   KAPLAMBRATIO 0.04 (L) 05/30/2019   Lab Results  Component Value Date   IGGSERUM 360 (L) 05/30/2019   IGA 18 (L) 05/30/2019   IGMSERUM <5 (L) 05/30/2019   Lab Results  Component Value Date   TOTALPROTELP 6.5 05/30/2019   ALBUMINELP 4.1 05/30/2019   A1GS 0.2 05/30/2019   A2GS 0.8 05/30/2019   BETS 1.0 05/30/2019   BETA2SER 0.4 11/23/2014   GAMS 0.3 (L) 05/30/2019   MSPIKE Not Observed 05/30/2019   SPEI Comment 01/30/2019     Chemistry      Component Value Date/Time   NA 141 06/26/2019 1027   NA 141 01/10/2017 1115   NA 140 06/21/2016 0918   K 3.3 (L) 06/26/2019 1027   K 4.0 01/10/2017 1115   K 4.3 06/21/2016 0918   CL 106 06/26/2019 1027   CL 106 01/10/2017 1115   CO2 27 06/26/2019 1027   CO2 27 01/10/2017 1115   CO2 20 (L) 06/21/2016 0918   BUN 16 06/26/2019 1027   BUN 15 01/10/2017 1115   BUN 15.8 06/21/2016 0918   CREATININE 0.86 06/26/2019 1027   CREATININE 1.0 01/10/2017 1115   CREATININE 0.8 06/21/2016 0918      Component Value Date/Time   CALCIUM 9.6 06/26/2019 1027   CALCIUM 9.5 01/10/2017 1115   CALCIUM 9.4 06/21/2016 0918   ALKPHOS 41 06/26/2019 1027   ALKPHOS 40 01/10/2017 1115   ALKPHOS 66 06/21/2016 0918    AST 10 (L) 06/26/2019 1027   AST 17 06/21/2016 0918   ALT 12 06/26/2019 1027   ALT 19 01/10/2017 1115   ALT 37 06/21/2016 0918   BILITOT 0.5 06/26/2019 1027   BILITOT 0.32 06/21/2016 0918       Impression and Plan: Ms. Bergeson is a very pleasant 69 yo African American female with recurrent lambda light chain myeloma. Her second a stem cell transplant for light chain myeloma was in June 2016.  Shealsohaslocalized recurrent endometrial cancer (followed by gyn onc Dr. Nelly Rout) and is currently on Femara.   We will have to see what her light chain studies show.  Hopefully, the lambda light chain will be stable.  If not, then we will have to make some adjustments with her protocol.  Overall, her quality of life is doing quite nicely right now.  It is always fun talking with her.  I know we like to share stories about our grandchildren.  I am so happy that her mom is still independent and hanging in down in [place].  We will plan to get her back to see [place] in another month.  Josph Macho, MD 5/27/202111:31 AM

## 2019-06-26 NOTE — Patient Instructions (Signed)
Daratumumab injection What is this medicine? DARATUMUMAB (dar a toom ue mab) is a monoclonal antibody. It is used to treat multiple myeloma. This medicine may be used for other purposes; ask your health care provider or pharmacist if you have questions. COMMON BRAND NAME(S): DARZALEX What should I tell my health care provider before I take this medicine? They need to know if you have any of these conditions:  infection (especially a virus infection such as chickenpox, herpes, or hepatitis B virus)  lung or breathing disease  an unusual or allergic reaction to daratumumab, other medicines, foods, dyes, or preservatives  pregnant or trying to get pregnant  breast-feeding How should I use this medicine? This medicine is for infusion into a vein. It is given by a health care professional in a hospital or clinic setting. Talk to your pediatrician regarding the use of this medicine in children. Special care may be needed. Overdosage: If you think you have taken too much of this medicine contact a poison control center or emergency room at once. NOTE: This medicine is only for you. Do not share this medicine with others. What if I miss a dose? Keep appointments for follow-up doses as directed. It is important not to miss your dose. Call your doctor or health care professional if you are unable to keep an appointment. What may interact with this medicine? Interactions have not been studied. This list may not describe all possible interactions. Give your health care provider a list of all the medicines, herbs, non-prescription drugs, or dietary supplements you use. Also tell them if you smoke, drink alcohol, or use illegal drugs. Some items may interact with your medicine. What should I watch for while using this medicine? This drug may make you feel generally unwell. Report any side effects. Continue your course of treatment even though you feel ill unless your doctor tells you to stop. This  medicine can cause serious allergic reactions. To reduce your risk you may need to take medicine before treatment with this medicine. Take your medicine as directed. This medicine can affect the results of blood tests to match your blood type. These changes can last for up to 6 months after the final dose. Your healthcare provider will do blood tests to match your blood type before you start treatment. Tell all of your healthcare providers that you are being treated with this medicine before receiving a blood transfusion. This medicine can affect the results of some tests used to determine treatment response; extra tests may be needed to evaluate response. Do not become pregnant while taking this medicine or for 3 months after stopping it. Women should inform their doctor if they wish to become pregnant or think they might be pregnant. There is a potential for serious side effects to an unborn child. Talk to your health care professional or pharmacist for more information. What side effects may I notice from receiving this medicine? Side effects that you should report to your doctor or health care professional as soon as possible:  allergic reactions like skin rash, itching or hives, swelling of the face, lips, or tongue  breathing problems  chills  cough  dizziness  feeling faint or lightheaded  headache  low blood counts - this medicine may decrease the number of white blood cells, red blood cells and platelets. You may be at increased risk for infections and bleeding.  nausea, vomiting  shortness of breath  signs of decreased platelets or bleeding - bruising, pinpoint red spots on  the skin, black, tarry stools, blood in the urine  signs of decreased red blood cells - unusually weak or tired, feeling faint or lightheaded, falls  signs of infection - fever or chills, cough, sore throat, pain or difficulty passing urine  signs and symptoms of liver injury like dark yellow or brown  urine; general ill feeling or flu-like symptoms; light-colored stools; loss of appetite; right upper belly pain; unusually weak or tired; yellowing of the eyes or skin Side effects that usually do not require medical attention (report to your doctor or health care professional if they continue or are bothersome):  back pain  constipation  diarrhea  joint pain  muscle cramps  pain, tingling, numbness in the hands or feet  swelling of the ankles, feet, hands  tiredness  trouble sleeping This list may not describe all possible side effects. Call your doctor for medical advice about side effects. You may report side effects to FDA at 1-800-FDA-1088. Where should I keep my medicine? This drug is given in a hospital or clinic and will not be stored at home. NOTE: This sheet is a summary. It may not cover all possible information. If you have questions about this medicine, talk to your doctor, pharmacist, or health care provider.  2020 Elsevier/Gold Standard (2018-09-24 18:10:54) Denosumab injection What is this medicine? DENOSUMAB (den oh sue mab) slows bone breakdown. Prolia is used to treat osteoporosis in women after menopause and in men, and in people who are taking corticosteroids for 6 months or more. Xgeva is used to treat a high calcium level due to cancer and to prevent bone fractures and other bone problems caused by multiple myeloma or cancer bone metastases. Xgeva is also used to treat giant cell tumor of the bone. This medicine may be used for other purposes; ask your health care provider or pharmacist if you have questions. COMMON BRAND NAME(S): Prolia, XGEVA What should I tell my health care provider before I take this medicine? They need to know if you have any of these conditions:  dental disease  having surgery or tooth extraction  infection  kidney disease  low levels of calcium or Vitamin D in the blood  malnutrition  on hemodialysis  skin conditions or  sensitivity  thyroid or parathyroid disease  an unusual reaction to denosumab, other medicines, foods, dyes, or preservatives  pregnant or trying to get pregnant  breast-feeding How should I use this medicine? This medicine is for injection under the skin. It is given by a health care professional in a hospital or clinic setting. A special MedGuide will be given to you before each treatment. Be sure to read this information carefully each time. For Prolia, talk to your pediatrician regarding the use of this medicine in children. Special care may be needed. For Xgeva, talk to your pediatrician regarding the use of this medicine in children. While this drug may be prescribed for children as young as 13 years for selected conditions, precautions do apply. Overdosage: If you think you have taken too much of this medicine contact a poison control center or emergency room at once. NOTE: This medicine is only for you. Do not share this medicine with others. What if I miss a dose? It is important not to miss your dose. Call your doctor or health care professional if you are unable to keep an appointment. What may interact with this medicine? Do not take this medicine with any of the following medications:  other medicines containing denosumab This   medicine may also interact with the following medications:  medicines that lower your chance of fighting infection  steroid medicines like prednisone or cortisone This list may not describe all possible interactions. Give your health care provider a list of all the medicines, herbs, non-prescription drugs, or dietary supplements you use. Also tell them if you smoke, drink alcohol, or use illegal drugs. Some items may interact with your medicine. What should I watch for while using this medicine? Visit your doctor or health care professional for regular checks on your progress. Your doctor or health care professional may order blood tests and other tests  to see how you are doing. Call your doctor or health care professional for advice if you get a fever, chills or sore throat, or other symptoms of a cold or flu. Do not treat yourself. This drug may decrease your body's ability to fight infection. Try to avoid being around people who are sick. You should make sure you get enough calcium and vitamin D while you are taking this medicine, unless your doctor tells you not to. Discuss the foods you eat and the vitamins you take with your health care professional. See your dentist regularly. Brush and floss your teeth as directed. Before you have any dental work done, tell your dentist you are receiving this medicine. Do not become pregnant while taking this medicine or for 5 months after stopping it. Talk with your doctor or health care professional about your birth control options while taking this medicine. Women should inform their doctor if they wish to become pregnant or think they might be pregnant. There is a potential for serious side effects to an unborn child. Talk to your health care professional or pharmacist for more information. What side effects may I notice from receiving this medicine? Side effects that you should report to your doctor or health care professional as soon as possible:  allergic reactions like skin rash, itching or hives, swelling of the face, lips, or tongue  bone pain  breathing problems  dizziness  jaw pain, especially after dental work  redness, blistering, peeling of the skin  signs and symptoms of infection like fever or chills; cough; sore throat; pain or trouble passing urine  signs of low calcium like fast heartbeat, muscle cramps or muscle pain; pain, tingling, numbness in the hands or feet; seizures  unusual bleeding or bruising  unusually weak or tired Side effects that usually do not require medical attention (report to your doctor or health care professional if they continue or are  bothersome):  constipation  diarrhea  headache  joint pain  loss of appetite  muscle pain  runny nose  tiredness  upset stomach This list may not describe all possible side effects. Call your doctor for medical advice about side effects. You may report side effects to FDA at 1-800-FDA-1088. Where should I keep my medicine? This medicine is only given in a clinic, doctor's office, or other health care setting and will not be stored at home. NOTE: This sheet is a summary. It may not cover all possible information. If you have questions about this medicine, talk to your doctor, pharmacist, or health care provider.  2020 Elsevier/Gold Standard (2017-05-25 16:10:44)  

## 2019-06-27 LAB — KAPPA/LAMBDA LIGHT CHAINS
Kappa free light chain: 4 mg/L (ref 3.3–19.4)
Kappa, lambda light chain ratio: 0.03 — ABNORMAL LOW (ref 0.26–1.65)
Lambda free light chains: 138.5 mg/L — ABNORMAL HIGH (ref 5.7–26.3)

## 2019-06-27 LAB — IGG, IGA, IGM
IgA: 18 mg/dL — ABNORMAL LOW (ref 87–352)
IgG (Immunoglobin G), Serum: 281 mg/dL — ABNORMAL LOW (ref 586–1602)
IgM (Immunoglobulin M), Srm: <5 mg/dL — ABNORMAL LOW (ref 26–217)

## 2019-06-27 LAB — IRON AND TIBC
Iron: 47 ug/dL (ref 41–142)
Saturation Ratios: 15 % — ABNORMAL LOW (ref 21–57)
TIBC: 311 ug/dL (ref 236–444)
UIBC: 264 ug/dL (ref 120–384)

## 2019-06-27 LAB — FERRITIN: Ferritin: 1410 ng/mL — ABNORMAL HIGH (ref 11–307)

## 2019-06-28 ENCOUNTER — Other Ambulatory Visit: Payer: Self-pay | Admitting: Hematology & Oncology

## 2019-06-28 DIAGNOSIS — E876 Hypokalemia: Secondary | ICD-10-CM

## 2019-07-01 ENCOUNTER — Ambulatory Visit: Payer: Self-pay | Admitting: Internal Medicine

## 2019-07-01 LAB — PROTEIN ELECTROPHORESIS, SERUM
A/G Ratio: 1.4 (ref 0.7–1.7)
Albumin ELP: 3.7 g/dL (ref 2.9–4.4)
Alpha-1-Globulin: 0.3 g/dL (ref 0.0–0.4)
Alpha-2-Globulin: 1.1 g/dL — ABNORMAL HIGH (ref 0.4–1.0)
Beta Globulin: 1 g/dL (ref 0.7–1.3)
Gamma Globulin: 0.3 g/dL — ABNORMAL LOW (ref 0.4–1.8)
Globulin, Total: 2.7 g/dL (ref 2.2–3.9)
Total Protein ELP: 6.4 g/dL (ref 6.0–8.5)

## 2019-07-08 ENCOUNTER — Other Ambulatory Visit: Payer: Self-pay | Admitting: *Deleted

## 2019-07-08 ENCOUNTER — Other Ambulatory Visit: Payer: Self-pay | Admitting: Hematology & Oncology

## 2019-07-08 DIAGNOSIS — C9000 Multiple myeloma not having achieved remission: Secondary | ICD-10-CM

## 2019-07-08 MED ORDER — POMALIDOMIDE 1 MG PO CAPS: ORAL_CAPSULE | ORAL | 0 refills | Status: DC

## 2019-07-16 ENCOUNTER — Telehealth: Payer: Medicare Other

## 2019-07-16 ENCOUNTER — Other Ambulatory Visit: Payer: Self-pay

## 2019-07-16 ENCOUNTER — Ambulatory Visit: Payer: Self-pay

## 2019-07-16 DIAGNOSIS — I10 Essential (primary) hypertension: Secondary | ICD-10-CM

## 2019-07-16 DIAGNOSIS — E6609 Other obesity due to excess calories: Secondary | ICD-10-CM

## 2019-07-16 DIAGNOSIS — C9000 Multiple myeloma not having achieved remission: Secondary | ICD-10-CM

## 2019-07-17 NOTE — Chronic Care Management (AMB) (Signed)
Chronic Care Management   Follow Up Note   07/17/2019 Name: Emily Greer MRN: [id number] DOB: 1950/10/23  Referred by: [name], MD Reason for referral : Chronic Care Management ([facility] RN CM Call - HTN/RLE edema)   Emily Greer is a 68 y.o. year old female who is a primary care patient of [name], MD. The CCM team was consulted for assistance with chronic disease management and care coordination needs.    Review of patient status, including review of consultants reports, relevant laboratory and other test results, and collaboration with appropriate care team members and the patient's provider was performed as part of comprehensive patient evaluation and provision of chronic care management services.    SDOH (Social Determinants of Health) assessments performed: No See Care Plan activities for detailed interventions related to [facility])   Reviewed chart in preparation to contact patient.     Outpatient Encounter Medications as of 07/16/2019  Medication Sig  . acyclovir (ZOVIRAX) 400 MG tablet TAKE 1 TABLET(400 MG) BY MOUTH TWICE DAILY  . albuterol (VENTOLIN HFA) 108 (90 Base) MCG/ACT inhaler Inhale 2 puffs into the lungs every 6 (six) hours as needed for wheezing or shortness of breath. 2 puffs 3 times daily x 5 days then every 6 hours as needed.  [name] Kitchen amLODipine (NORVASC) 10 MG tablet Take 1 tablet (10 mg total) by mouth every morning.  [name] Kitchen aspirin EC 81 MG tablet Take 81 mg by mouth 2 (two) times daily. Stopped  By dr Myna Hidalgo until urology procedures complete  . Calcium-Magnesium-Vitamin D (CALCIUM 1200+D3 PO) Take 1 tablet by mouth daily.  . Cholecalciferol (VITAMIN D3) 2000 UNITS TABS Take 2 tablets by mouth daily.   . daratumumab (DARZALEX) 400 MG/20ML Inject into the vein.  . hydrochlorothiazide (MICROZIDE) 12.5 MG capsule Take 12.5 mg by mouth every morning.  [name] Kitchen letrozole (FEMARA) 2.5 MG tablet Take 1 tablet (2.5 mg total) by mouth daily.  [name] Kitchen loperamide (IMODIUM) 2 MG  capsule Take by mouth as needed for diarrhea or loose stools. Reported on 08/19/2015  . loratadine (CLARITIN) 10 MG tablet Take 10 mg by mouth every morning. Reported on 08/19/2015  . LORazepam (ATIVAN) 0.5 MG tablet Take 1 tablet (0.5 mg total) by mouth every 6 (six) hours as needed (Nausea or vomiting). (Patient not taking: Reported on 06/26/2019)  . metoprolol succinate (TOPROL-XL) 50 MG 24 hr tablet Take 1 tablet (50 mg total) by mouth every morning.  . montelukast (SINGULAIR) 10 MG tablet TAKE 1 TABLET(10 MG) BY MOUTH AT BEDTIME  . pomalidomide (POMALYST) 1 MG capsule TAKE 1 CAPSULE BY MOUTH ONCE DAILY FOR 21 DAYS ON AND 7 DAYS OFF Auth# [id number]  . potassium chloride SA (KLOR-CON) 20 MEQ tablet TAKE 1 TABLET(20 MEQ) BY MOUTH TWICE DAILY  . Probiotic Product (PROBIOTIC DAILY PO) Take by mouth daily.  [name] Kitchen triamcinolone (NASACORT ALLERGY 24HR) 55 MCG/ACT AERO nasal inhaler Place 2 sprays into the nose as needed.   . triamterene-hydrochlorothiazide (MAXZIDE-25) 37.5-25 MG tablet 1 tablet by mouth at bedtime   Facility-Administered Encounter Medications as of 07/16/2019  Medication  . sodium chloride flush (NS) 0.9 % injection 10 mL  . sodium chloride flush (NS) 0.9 % injection 10 mL     Objective:  Lab Results  Component Value Date   HGBA1C 5.7 (H) 06/01/2014   HGBA1C 5.9 03/22/2007   Lab Results  Component Value Date   LDLCALC 164 (H) 03/22/2007   CREATININE 0.86 06/26/2019   BP Readings from Last 3 Encounters:  06/26/19 (!) 159/72  05/30/19 136/78  05/30/19 (!) 148/83   Plan:   Telephone follow up appointment with care management team member scheduled for: 07/18/19  Delsa Sale, RN, BSN, CCM Care Management Coordinator [facility] Care Management/[facility]  Direct Phone: [phone]

## 2019-07-18 ENCOUNTER — Telehealth: Payer: Self-pay

## 2019-07-21 ENCOUNTER — Other Ambulatory Visit: Payer: Self-pay

## 2019-07-21 ENCOUNTER — Telehealth: Payer: Medicare Other

## 2019-07-21 ENCOUNTER — Ambulatory Visit (INDEPENDENT_AMBULATORY_CARE_PROVIDER_SITE_OTHER): Payer: Medicare Other

## 2019-07-21 DIAGNOSIS — C9000 Multiple myeloma not having achieved remission: Secondary | ICD-10-CM

## 2019-07-21 DIAGNOSIS — I10 Essential (primary) hypertension: Secondary | ICD-10-CM | POA: Diagnosis not present

## 2019-07-21 DIAGNOSIS — E6609 Other obesity due to excess calories: Secondary | ICD-10-CM

## 2019-07-23 NOTE — Patient Instructions (Signed)
Visit Information  Goals Addressed      Patient Stated   .  "My Cancer treatment is going well" (pt-stated)        Current Barriers:  [name] Kitchen Knowledge Deficits related to Multiple Myeloma . Chronic Disease Management support and education needs related to Multiple Myeloma; HTN , Class 1 Obesity   Nurse Case Manager Clinical Goal(s):  [name] Kitchen Over the next 90 days, patient will work with the CCM team to address needs related to disease management and support for Multiple Myeloma  CCM RN CM Interventions:  07/22/19 call completed with patient  . Evaluation of current treatment plan related to Multiple Myeloma and patient's adherence to plan as established by provider . Determined patient feels her Cancer treatment is going well with Dr. Marin Olp, Oncologist managing this illness with last OV completed on 06/26/19 and the following Assessment/Plan is noted:  o Principle Diagnosis:  o Recurrent lambda light chain myeloma o History of recurrent endometrial carcinoma o Iron deficiency anemia -blood loss o Past Therapy:             o Status post second autologous stem cell transplant on 07/24/2014 o Maintenance therapy with Pomalidomide/every 2 week Velcade - d/c'ed o Radiation therapy for endometrial recurrence - completed 04/20/2015 o Pomalyst/Kyprolis 81m/m2 IV q 2 weeks - s/p cycle #12 - held on 12/26/2017 for hematuria o Current Therapy:        o Daratumumab/Pomalyst (1 mg) - status post cycle #18 o Femara 2.5 mg po q day  o Xgeva 120 mg subcu every 3 months-next dose in 07/2019 o IV iron as indicated o Noted Dr. EMarin Olprecommends patient take 1 baby ASA qd and Toprol XL 50 mg 24 hr tablet qd  . Assessed for hydration and nutrition status; patient is able to tolerate a regular diet and feels she is staying well hydrated . Encouraged patient to balance her activity with rest and to notify her Oncology team of any status change promptly . Determined next Oncology f/u with Dr. EMarin Olpis scheduled  for 07/24/19 . Discussed plans with patient for ongoing care management follow up and provided patient with direct contact information for care management team  Patient Self Care Activities:  . Self administers medications as prescribed . Attends all scheduled provider appointments . Calls pharmacy for medication refills . Performs ADL's independently . Performs IADL's independently . Calls provider office for new concerns or questions  Please see past updates related to this goal by clicking on the "Past Updates" button in the selected goal      .  "to keep my BP under good control" (pt-stated)        Current Barriers:  .[name] KitchenKnowledge Deficits related to disease process and Self health management of Hypertension . Chronic Disease Management support and education needs related to HTN, Multiple Myeloma, Class 1 Obesity  Nurse Case Manager Clinical Goal(s):  .[name] KitchenOver the next 90 days, patient will work with the C[place]CM and PCP to address needs related to Hypertension  CM RN CM Interventions:  07/22/19 call completed with patient  . Evaluation of current treatment plan related to Hypertension and patient's adherence to plan as established by provider . Determined Mrs. WBarracloughhas been under added stress since our last contact due to her spouse had a recent Stroke, it was mild and he is recovering . Reinforced target BP <130/80; Reviewed and discussed Dr. EMarin Olpadded Toprol XL 50 mg, 24 hr tablet qd  . Determined patient  continues to have persistent edema noted to her RLE with ankle tenderness, she denies injury to this extremity, Dr. Marin Olp and PCP are aware . Determined next OV with Dr. Baird Cancer has been rescheduled to 07/30/19 _0 :15 PM  . Discussed plans with patient for ongoing care management follow up and provided patient with direct contact information for care management team  Patient Self Care Activities:  . Self administers medications as prescribed . Attends all scheduled  provider appointments . Calls pharmacy for medication refills . Performs ADL's independently . Performs IADL's independently . Calls provider office for new concerns or questions  Please see past updates related to this goal by clicking on the "Past Updates" button in the selected goal         Patient verbalizes understanding of instructions provided today.   Telephone follow up appointment with care management team member scheduled for:  09/16/19  Emily Merino, RN, BSN, CCM Care Management Coordinator [place] [facility]  Direct Phone: [phone]

## 2019-07-23 NOTE — Chronic Care Management (AMB) (Signed)
Chronic Care Management   Follow Up Note   07/22/2019 Name: Emily Greer MRN: [id number] DOB: 1950/07/11  Referred by: [name], MD Reason for referral : Chronic Care Management (FU RN CM Call )   Emily Greer is a 69 y.o. year old female who is a primary care patient of [name], MD. The CCM team was consulted for assistance with chronic disease management and care coordination needs.    Review of patient status, including review of consultants reports, relevant laboratory and other test results, and collaboration with appropriate care team members and the patient's provider was performed as part of comprehensive patient evaluation and provision of chronic care management services.    SDOH (Social Determinants of Health) assessments performed: Yes - no acute needs See Care Plan activities for detailed interventions related to SDOH)   Placed outbound CCM RN CM follow up call to patient.     Outpatient Encounter Medications as of 07/21/2019  Medication Sig  . acyclovir (ZOVIRAX) 400 MG tablet TAKE 1 TABLET(400 MG) BY MOUTH TWICE DAILY  . albuterol (VENTOLIN HFA) 108 (90 Base) MCG/ACT inhaler Inhale 2 puffs into the lungs every 6 (six) hours as needed for wheezing or shortness of breath. 2 puffs 3 times daily x 5 days then every 6 hours as needed.  [name] Kitchen amLODipine (NORVASC) 10 MG tablet Take 1 tablet (10 mg total) by mouth every morning.  [name] Kitchen aspirin EC 81 MG tablet Take 81 mg by mouth 2 (two) times daily. Stopped  By dr Myna Hidalgo until urology procedures complete  . Calcium-Magnesium-Vitamin D (CALCIUM 1200+D3 PO) Take 1 tablet by mouth daily.  . Cholecalciferol (VITAMIN D3) 2000 UNITS TABS Take 2 tablets by mouth daily.   . daratumumab (DARZALEX) 400 MG/20ML Inject into the vein.  . hydrochlorothiazide (MICROZIDE) 12.5 MG capsule Take 12.5 mg by mouth every morning.  [name] Kitchen letrozole (FEMARA) 2.5 MG tablet Take 1 tablet (2.5 mg total) by mouth daily.  [name] Kitchen loperamide (IMODIUM) 2  MG capsule Take by mouth as needed for diarrhea or loose stools. Reported on 08/19/2015  . loratadine (CLARITIN) 10 MG tablet Take 10 mg by mouth every morning. Reported on 08/19/2015  . LORazepam (ATIVAN) 0.5 MG tablet Take 1 tablet (0.5 mg total) by mouth every 6 (six) hours as needed (Nausea or vomiting). (Patient not taking: Reported on 06/26/2019)  . metoprolol succinate (TOPROL-XL) 50 MG 24 hr tablet Take 1 tablet (50 mg total) by mouth every morning.  . montelukast (SINGULAIR) 10 MG tablet TAKE 1 TABLET(10 MG) BY MOUTH AT BEDTIME  . pomalidomide (POMALYST) 1 MG capsule TAKE 1 CAPSULE BY MOUTH ONCE DAILY FOR 21 DAYS ON AND 7 DAYS OFF Auth# [id number]  . potassium chloride SA (KLOR-CON) 20 MEQ tablet TAKE 1 TABLET(20 MEQ) BY MOUTH TWICE DAILY  . Probiotic Product (PROBIOTIC DAILY PO) Take by mouth daily.  [name] Kitchen triamcinolone (NASACORT ALLERGY 24HR) 55 MCG/ACT AERO nasal inhaler Place 2 sprays into the nose as needed.   . triamterene-hydrochlorothiazide (MAXZIDE-25) 37.5-25 MG tablet 1 tablet by mouth at bedtime   Facility-Administered Encounter Medications as of 07/21/2019  Medication  . sodium chloride flush (NS) 0.9 % injection 10 mL  . sodium chloride flush (NS) 0.9 % injection 10 mL     Objective:  Lab Results  Component Value Date   HGBA1C 5.7 (H) 06/01/2014   HGBA1C 5.9 03/22/2007   Lab Results  Component Value Date   LDLCALC 164 (H) 03/22/2007   CREATININE 0.86 06/26/2019   BP  Readings from Last 3 Encounters:  06/26/19 (!) 159/72  05/30/19 136/78  05/30/19 (!) 148/83    Goals Addressed      Patient Stated   .  "My Cancer treatment is going well" (pt-stated)        Current Barriers:  [name] Kitchen Knowledge Deficits related to Multiple Myeloma . Chronic Disease Management support and education needs related to Multiple Myeloma; HTN , Class 1 Obesity   Nurse Case Manager Clinical Goal(s):  [name] Kitchen Over the next 90 days, patient will work with the CCM team to address needs related to disease  management and support for Multiple Myeloma  CCM RN CM Interventions:  07/22/19 call completed with patient  . Evaluation of current treatment plan related to Multiple Myeloma and patient's adherence to plan as established by provider . Determined patient feels her Cancer treatment is going well with Dr. Myna Hidalgo, Oncologist managing this illness with last OV completed on 06/26/19 and the following Assessment/Plan is noted:  o Principle Diagnosis:  o Recurrent lambda light chain myeloma o History of recurrent endometrial carcinoma o Iron deficiency anemia -blood loss o Past Therapy:             o Status post second autologous stem cell transplant on 07/24/2014 o Maintenance therapy with Pomalidomide/every 2 week Velcade - d/c'ed o Radiation therapy for endometrial recurrence - completed 04/20/2015 o Pomalyst/Kyprolis 70mg /m2 IV q 2 weeks - s/p cycle #12 - held on 12/26/2017 for hematuria o Current Therapy:        o Daratumumab/Pomalyst (1 mg) - status post cycle #18 o Femara 2.5 mg po q day  o Xgeva 120 mg subcu every 3 months-next dose in 07/2019 o IV iron as indicated o Noted Dr. Myna Hidalgo recommends patient take 1 baby ASA qd and Toprol XL 50 mg 24 hr tablet qd  . Assessed for hydration and nutrition status; patient is able to tolerate a regular diet and feels she is staying well hydrated . Encouraged patient to balance her activity with rest and to notify her Oncology team of any status change promptly . Determined next Oncology f/u with Dr. Myna Hidalgo is scheduled for 07/24/19 . Discussed plans with patient for ongoing care management follow up and provided patient with direct contact information for care management team  Patient Self Care Activities:  . Self administers medications as prescribed . Attends all scheduled provider appointments . Calls pharmacy for medication refills . Performs ADL's independently . Performs IADL's independently . Calls provider office for new concerns or  questions  Please see past updates related to this goal by clicking on the "Past Updates" button in the selected goal      .  "to keep my BP under good control" (pt-stated)        Current Barriers:  [name] Kitchen Knowledge Deficits related to disease process and Self health management of Hypertension . Chronic Disease Management support and education needs related to HTN, Multiple Myeloma, Class 1 Obesity  Nurse Case Manager Clinical Goal(s):  [name] Kitchen Over the next 90 days, patient will work with the CCM RN CM and PCP to address needs related to Hypertension  CM RN CM Interventions:  07/22/19 call completed with patient  . Evaluation of current treatment plan related to Hypertension and patient's adherence to plan as established by provider . Determined Mrs. Rosengrant has been under added stress since our last contact due to her spouse had a recent Stroke, it was mild and he is recovering . Reinforced target BP <130/80; Reviewed  and discussed Dr. Myna Hidalgo added Toprol XL 50 mg, 24 hr tablet qd  . Determined patient continues to have persistent edema noted to her RLE with ankle tenderness, she denies injury to this extremity, Dr. Myna Hidalgo and PCP are aware . Determined next OV with Dr. Allyne Gee has been rescheduled to 07/30/19 @2 :15 PM  . Discussed plans with patient for ongoing care management follow up and provided patient with direct contact information for care management team  Patient Self Care Activities:  . Self administers medications as prescribed . Attends all scheduled provider appointments . Calls pharmacy for medication refills . Performs ADL's independently . Performs IADL's independently . Calls provider office for new concerns or questions  Please see past updates related to this goal by clicking on the "Past Updates" button in the selected goal         Plan:   Telephone follow up appointment with care management team member scheduled for: 09/16/19  Delsa Sale, RN, BSN, CCM Care  Management Coordinator [facility]  Direct Phone: [phone]

## 2019-07-24 ENCOUNTER — Other Ambulatory Visit: Payer: Self-pay

## 2019-07-24 ENCOUNTER — Telehealth: Payer: Self-pay | Admitting: Hematology & Oncology

## 2019-07-24 ENCOUNTER — Encounter: Payer: Self-pay | Admitting: Hematology & Oncology

## 2019-07-24 ENCOUNTER — Inpatient Hospital Stay: Payer: Medicare Other | Attending: Hematology & Oncology | Admitting: Hematology & Oncology

## 2019-07-24 ENCOUNTER — Inpatient Hospital Stay: Payer: Medicare Other

## 2019-07-24 VITALS — BP 124/72 | HR 82 | Temp 97.1°F | Resp 18 | Ht 64.0 in

## 2019-07-24 VITALS — BP 124/72 | HR 82 | Temp 97.1°F | Resp 17

## 2019-07-24 DIAGNOSIS — Z5112 Encounter for antineoplastic immunotherapy: Secondary | ICD-10-CM | POA: Diagnosis not present

## 2019-07-24 DIAGNOSIS — C9000 Multiple myeloma not having achieved remission: Secondary | ICD-10-CM | POA: Insufficient documentation

## 2019-07-24 DIAGNOSIS — D500 Iron deficiency anemia secondary to blood loss (chronic): Secondary | ICD-10-CM | POA: Insufficient documentation

## 2019-07-24 DIAGNOSIS — M7989 Other specified soft tissue disorders: Secondary | ICD-10-CM | POA: Insufficient documentation

## 2019-07-24 DIAGNOSIS — Z95828 Presence of other vascular implants and grafts: Secondary | ICD-10-CM

## 2019-07-24 DIAGNOSIS — D509 Iron deficiency anemia, unspecified: Secondary | ICD-10-CM | POA: Insufficient documentation

## 2019-07-24 DIAGNOSIS — Z885 Allergy status to narcotic agent status: Secondary | ICD-10-CM | POA: Diagnosis not present

## 2019-07-24 DIAGNOSIS — C9001 Multiple myeloma in remission: Secondary | ICD-10-CM

## 2019-07-24 DIAGNOSIS — C541 Malignant neoplasm of endometrium: Secondary | ICD-10-CM

## 2019-07-24 DIAGNOSIS — D508 Other iron deficiency anemias: Secondary | ICD-10-CM

## 2019-07-24 LAB — CBC WITH DIFFERENTIAL (CANCER CENTER ONLY)
Abs Immature Granulocytes: 0.03 10*3/uL (ref 0.00–0.07)
Basophils Absolute: 0.1 10*3/uL (ref 0.0–0.1)
Basophils Relative: 3 %
Eosinophils Absolute: 0.2 10*3/uL (ref 0.0–0.5)
Eosinophils Relative: 4 %
HCT: 31.9 % — ABNORMAL LOW (ref 36.0–46.0)
Hemoglobin: 10.3 g/dL — ABNORMAL LOW (ref 12.0–15.0)
Immature Granulocytes: 1 %
Lymphocytes Relative: 15 %
Lymphs Abs: 0.8 10*3/uL (ref 0.7–4.0)
MCH: 27.6 pg (ref 26.0–34.0)
MCHC: 32.3 g/dL (ref 30.0–36.0)
MCV: 85.5 fL (ref 80.0–100.0)
Monocytes Absolute: 0.9 10*3/uL (ref 0.1–1.0)
Monocytes Relative: 17 %
Neutro Abs: 3.1 10*3/uL (ref 1.7–7.7)
Neutrophils Relative %: 60 %
Platelet Count: 239 10*3/uL (ref 150–400)
RBC: 3.73 MIL/uL — ABNORMAL LOW (ref 3.87–5.11)
RDW: 15.2 % (ref 11.5–15.5)
WBC Count: 5.2 10*3/uL (ref 4.0–10.5)
nRBC: 0 % (ref 0.0–0.2)

## 2019-07-24 LAB — CMP (CANCER CENTER ONLY)
ALT: 15 U/L (ref 0–44)
AST: 10 U/L — ABNORMAL LOW (ref 15–41)
Albumin: 4.2 g/dL (ref 3.5–5.0)
Alkaline Phosphatase: 40 U/L (ref 38–126)
Anion gap: 9 (ref 5–15)
BUN: 19 mg/dL (ref 8–23)
CO2: 26 mmol/L (ref 22–32)
Calcium: 9.8 mg/dL (ref 8.9–10.3)
Chloride: 106 mmol/L (ref 98–111)
Creatinine: 0.98 mg/dL (ref 0.44–1.00)
GFR, Est AFR Am: >60 mL/min (ref >60)
GFR, Estimated: 60 mL/min — ABNORMAL LOW (ref >60)
Glucose, Bld: 101 mg/dL — ABNORMAL HIGH (ref 70–99)
Potassium: 3.6 mmol/L (ref 3.5–5.1)
Sodium: 141 mmol/L (ref 135–145)
Total Bilirubin: 0.4 mg/dL (ref 0.3–1.2)
Total Protein: 6.4 g/dL — ABNORMAL LOW (ref 6.5–8.1)

## 2019-07-24 LAB — FERRITIN: Ferritin: 1892 ng/mL — ABNORMAL HIGH (ref 11–307)

## 2019-07-24 LAB — IRON AND TIBC
Iron: 69 ug/dL (ref 41–142)
Saturation Ratios: 22 % (ref 21–57)
TIBC: 315 ug/dL (ref 236–444)
UIBC: 246 ug/dL (ref 120–384)

## 2019-07-24 MED ORDER — SODIUM CHLORIDE 0.9% FLUSH
10.0000 mL | INTRAVENOUS | Status: DC | PRN
  Administered 2019-07-24: 10 mL
  Filled 2019-07-24: qty 10

## 2019-07-24 MED ORDER — DARATUMUMAB-HYALURONIDASE-FIHJ 1800-30000 MG-UT/15ML ~~LOC~~ SOLN
1800.0000 mg | Freq: Once | SUBCUTANEOUS | Status: AC
  Administered 2019-07-24: 1800 mg via SUBCUTANEOUS
  Filled 2019-07-24: qty 15

## 2019-07-24 MED ORDER — ACETAMINOPHEN 325 MG PO TABS
650.0000 mg | ORAL_TABLET | Freq: Once | ORAL | Status: AC
  Administered 2019-07-24: 650 mg via ORAL

## 2019-07-24 MED ORDER — DEXAMETHASONE 4 MG PO TABS
20.0000 mg | ORAL_TABLET | Freq: Once | ORAL | Status: AC
  Administered 2019-07-24: 20 mg via ORAL

## 2019-07-24 MED ORDER — DEXAMETHASONE 4 MG PO TABS
ORAL_TABLET | ORAL | Status: AC
  Filled 2019-07-24: qty 5

## 2019-07-24 MED ORDER — SODIUM CHLORIDE 0.9 % IV SOLN
510.0000 mg | Freq: Once | INTRAVENOUS | Status: AC
  Administered 2019-07-24: 510 mg via INTRAVENOUS
  Filled 2019-07-24: qty 17

## 2019-07-24 MED ORDER — ACETAMINOPHEN 325 MG PO TABS
ORAL_TABLET | ORAL | Status: AC
  Filled 2019-07-24: qty 2

## 2019-07-24 MED ORDER — SODIUM CHLORIDE 0.9% FLUSH
10.0000 mL | Freq: Once | INTRAVENOUS | Status: AC
  Administered 2019-07-24: 10 mL via INTRAVENOUS
  Filled 2019-07-24: qty 10

## 2019-07-24 MED ORDER — DIPHENHYDRAMINE HCL 25 MG PO CAPS
50.0000 mg | ORAL_CAPSULE | Freq: Once | ORAL | Status: AC
  Administered 2019-07-24: 50 mg via ORAL

## 2019-07-24 MED ORDER — HEPARIN SOD (PORK) LOCK FLUSH 100 UNIT/ML IV SOLN
500.0000 [IU] | Freq: Once | INTRAVENOUS | Status: AC | PRN
  Administered 2019-07-24: 500 [IU]
  Filled 2019-07-24: qty 5

## 2019-07-24 MED ORDER — DIPHENHYDRAMINE HCL 25 MG PO CAPS
ORAL_CAPSULE | ORAL | Status: AC
  Filled 2019-07-24: qty 2

## 2019-07-24 MED ORDER — SODIUM CHLORIDE 0.9 % IV SOLN
Freq: Once | INTRAVENOUS | Status: AC
  Filled 2019-07-24: qty 250

## 2019-07-24 NOTE — Progress Notes (Signed)
Hematology and Oncology Follow Up Visit  Emily Greer [id number] Apr 22, 1950 69 y.o. 07/24/2019   Principle Diagnosis:  Recurrent lambda light chain myeloma History of recurrent endometrial carcinoma Iron deficiency anemia-blood loss  PastTherapy: Status post second autologous stem cell transplant on 07/24/2014 Maintenance therapy with Pomalidomide/every 2 week Velcade - d/c'ed Radiation therapy for endometrial recurrence - completed 04/20/2015 Pomalyst/Kyprolis 70mg /m2 IV q 2 weeks - s/p cycle #12 - held on 12/26/2017 for hematuria  Current Therapy:        Daratumumab/Pomalyst (1 mg) - status post cycle#18 Femara 2.5 mg po q day Xgeva 120 mg subcu every 3 months-next dose in 07/2019 IV iron as indicated   Interim History:  Emily Greer is here today for follow-up and treatment.  Unfortunately, her husband had a brain bleed a couple weeks ago.  Thankfully, they could operate on this and remove the bleed.  He still has some neurological issues but does seem to be getting better.  She is busy trying to help him out.  She really is doing a great job.  She is also trying to help her mom who was in her 90s.  She has had no problems with the Darzalex and pomalidomide.  Her last lambda light chain was 13.8 mg/dL.  This really is holding stable.  She has had no fever.  She has had no bleeding.  Is been no problems with bowels or bladder.  She has had no leg swelling.  There is no cough or shortness of breath.  She has had no chest wall pain.  Overall, I would say her performance status is ECOG 1.    Medications:  Allergies as of 07/24/2019      Reactions   Codeine Nausea Only      Medication List       Accurate as of July 24, 2019  9:56 AM. If you have any questions, ask your nurse or doctor.        acyclovir 400 MG tablet Commonly known as: ZOVIRAX TAKE 1 TABLET(400 MG) BY MOUTH TWICE DAILY   albuterol 108 (90 Base) MCG/ACT inhaler Commonly known  as: VENTOLIN HFA Inhale 2 puffs into the lungs every 6 (six) hours as needed for wheezing or shortness of breath. 2 puffs 3 times daily x 5 days then every 6 hours as needed.   amLODipine 10 MG tablet Commonly known as: NORVASC Take 1 tablet (10 mg total) by mouth every morning.   aspirin EC 81 MG tablet Take 81 mg by mouth 2 (two) times daily. Stopped  By dr Emily Greer until urology procedures complete   CALCIUM 1200+D3 PO Take 1 tablet by mouth daily.   daratumumab 400 MG/20ML Commonly known as: DARZALEX Inject into the vein.   hydrochlorothiazide 12.5 MG capsule Commonly known as: MICROZIDE Take 12.5 mg by mouth every morning.   letrozole 2.5 MG tablet Commonly known as: FEMARA Take 1 tablet (2.5 mg total) by mouth daily.   loperamide 2 MG capsule Commonly known as: IMODIUM Take by mouth as needed for diarrhea or loose stools. Reported on 08/19/2015   loratadine 10 MG tablet Commonly known as: CLARITIN Take 10 mg by mouth every morning. Reported on 08/19/2015   LORazepam 0.5 MG tablet Commonly known as: Ativan Take 1 tablet (0.5 mg total) by mouth every 6 (six) hours as needed (Nausea or vomiting).   metoprolol succinate 50 MG 24 hr tablet Commonly known as: TOPROL-XL Take 1 tablet (50 mg total) by mouth every morning.  montelukast 10 MG tablet Commonly known as: SINGULAIR TAKE 1 TABLET(10 MG) BY MOUTH AT BEDTIME   Nasacort Allergy 24HR 55 MCG/ACT Aero nasal inhaler Generic drug: triamcinolone Place 2 sprays into the nose as needed.   pomalidomide 1 MG capsule Commonly known as: Pomalyst TAKE 1 CAPSULE BY MOUTH ONCE DAILY FOR 21 DAYS ON AND 7 DAYS OFF Auth# [id number]   potassium chloride SA 20 MEQ tablet Commonly known as: KLOR-CON TAKE 1 TABLET(20 MEQ) BY MOUTH TWICE DAILY   PROBIOTIC DAILY PO Take by mouth daily.   triamterene-hydrochlorothiazide 37.5-25 MG tablet Commonly known as: MAXZIDE-25 1 tablet by mouth at bedtime   Vitamin D3 50 MCG (2000 UT)  Tabs Take 2 tablets by mouth daily.       Allergies:  Allergies  Allergen Reactions  . Codeine Nausea Only    Past Medical History, Surgical history, Social history, and Family History were reviewed and updated.  Review of Systems: Review of Systems  Constitutional: Negative.   HENT: Negative.   Eyes: Negative.   Respiratory: Negative.   Cardiovascular: Positive for leg swelling.  Gastrointestinal: Negative.   Genitourinary: Negative.   Musculoskeletal: Negative.   Skin: Negative.   Neurological: Negative.   Endo/Heme/Allergies: Negative.   Psychiatric/Behavioral: Negative.      Physical Exam:  height is 5\' 4"  (1.626 m). Her temporal temperature is 97.1 F (36.2 C) (abnormal). Her blood pressure is 124/72 and her pulse is 82. Her respiration is 18 and oxygen saturation is 100%.   Wt Readings from Last 3 Encounters:  06/26/19 195 lb 1.9 oz (88.5 kg)  05/30/19 194 lb 1.3 oz (88 kg)  04/03/19 197 lb 0.6 oz (89.4 kg)    Her vital signs show temperature of 97.7.  Pulse 98.  Blood pressure 159/72.  Weight is 195 pounds.  Physical Exam Vitals reviewed.  HENT:     Head: Normocephalic and atraumatic.  Eyes:     Pupils: Pupils are equal, round, and reactive to light.  Cardiovascular:     Rate and Rhythm: Normal rate and regular rhythm.     Heart sounds: Normal heart sounds.  Pulmonary:     Effort: Pulmonary effort is normal.     Breath sounds: Normal breath sounds.  Abdominal:     General: Bowel sounds are normal.     Palpations: Abdomen is soft.  Musculoskeletal:        General: No tenderness or deformity. Normal range of motion.     Cervical back: Normal range of motion.  Lymphadenopathy:     Cervical: No cervical adenopathy.  Skin:    General: Skin is warm and dry.     Findings: No erythema or rash.  Neurological:     Mental Status: She is alert and oriented to person, place, and time.  Psychiatric:        Behavior: Behavior normal.        Thought  Content: Thought content normal.        Judgment: Judgment normal.      Lab Results  Component Value Date   WBC 5.2 07/24/2019   HGB 10.3 (L) 07/24/2019   HCT 31.9 (L) 07/24/2019   MCV 85.5 07/24/2019   PLT 239 07/24/2019   Lab Results  Component Value Date   FERRITIN 1,410 (H) 06/26/2019   IRON 47 06/26/2019   TIBC 311 06/26/2019   UIBC 264 06/26/2019   IRONPCTSAT 15 (L) 06/26/2019   Lab Results  Component Value Date   RETICCTPCT 1.4 06/26/2019  RBC 3.73 (L) 07/24/2019   Lab Results  Component Value Date   KPAFRELGTCHN 4.0 06/26/2019   LAMBDASER 138.5 (H) 06/26/2019   KAPLAMBRATIO 0.03 (L) 06/26/2019   Lab Results  Component Value Date   IGGSERUM 281 (L) 06/26/2019   IGA 18 (L) 06/26/2019   IGMSERUM <5 (L) 06/26/2019   Lab Results  Component Value Date   TOTALPROTELP 6.4 06/26/2019   ALBUMINELP 3.7 06/26/2019   A1GS 0.3 06/26/2019   A2GS 1.1 (H) 06/26/2019   BETS 1.0 06/26/2019   BETA2SER 0.4 11/23/2014   GAMS 0.3 (L) 06/26/2019   MSPIKE Not Observed 06/26/2019   SPEI Comment 06/26/2019     Chemistry      Component Value Date/Time   NA 141 07/24/2019 0910   NA 141 01/10/2017 1115   NA 140 06/21/2016 0918   K 3.6 07/24/2019 0910   K 4.0 01/10/2017 1115   K 4.3 06/21/2016 0918   CL 106 07/24/2019 0910   CL 106 01/10/2017 1115   CO2 26 07/24/2019 0910   CO2 27 01/10/2017 1115   CO2 20 (L) 06/21/2016 0918   BUN 19 07/24/2019 0910   BUN 15 01/10/2017 1115   BUN 15.8 06/21/2016 0918   CREATININE 0.98 07/24/2019 0910   CREATININE 1.0 01/10/2017 1115   CREATININE 0.8 06/21/2016 0918      Component Value Date/Time   CALCIUM 9.8 07/24/2019 0910   CALCIUM 9.5 01/10/2017 1115   CALCIUM 9.4 06/21/2016 0918   ALKPHOS 40 07/24/2019 0910   ALKPHOS 40 01/10/2017 1115   ALKPHOS 66 06/21/2016 0918   AST 10 (L) 07/24/2019 0910   AST 17 06/21/2016 0918   ALT 15 07/24/2019 0910   ALT 19 01/10/2017 1115   ALT 37 06/21/2016 0918   BILITOT 0.4 07/24/2019  0910   BILITOT 0.32 06/21/2016 0918       Impression and Plan: Ms. Traverse is a very pleasant 69 yo African American female with recurrent lambda light chain myeloma. Her second a stem cell transplant for light chain myeloma was in June 2016.  Shealsohaslocalized recurrent endometrial cancer (followed by gyn onc Dr. Nelly Rout) and is currently on Femara.   We will have to see what her light chain studies show.  Hopefully, the lambda light chain will be stable.  If not, then we will have to make some adjustments with her protocol.  Hopefully, her husband will continue to make progress from having this hemorrhagic stroke.  It sounds like it was fairly localized so that hopefully will bode well for him.  We will plan to get her back in another month.    Josph Macho, MD 6/24/20219:56 AM

## 2019-07-24 NOTE — Patient Instructions (Signed)
Daratumumab injection What is this medicine? DARATUMUMAB (dar a toom ue mab) is a monoclonal antibody. It is used to treat multiple myeloma. This medicine may be used for other purposes; ask your health care provider or pharmacist if you have questions. COMMON BRAND NAME(S): DARZALEX What should I tell my health care provider before I take this medicine? They need to know if you have any of these conditions:  infection (especially a virus infection such as chickenpox, herpes, or hepatitis B virus)  lung or breathing disease  an unusual or allergic reaction to daratumumab, other medicines, foods, dyes, or preservatives  pregnant or trying to get pregnant  breast-feeding How should I use this medicine? This medicine is for infusion into a vein. It is given by a health care professional in a hospital or clinic setting. Talk to your pediatrician regarding the use of this medicine in children. Special care may be needed. Overdosage: If you think you have taken too much of this medicine contact a poison control center or emergency room at once. NOTE: This medicine is only for you. Do not share this medicine with others. What if I miss a dose? Keep appointments for follow-up doses as directed. It is important not to miss your dose. Call your doctor or health care professional if you are unable to keep an appointment. What may interact with this medicine? Interactions have not been studied. This list may not describe all possible interactions. Give your health care provider a list of all the medicines, herbs, non-prescription drugs, or dietary supplements you use. Also tell them if you smoke, drink alcohol, or use illegal drugs. Some items may interact with your medicine. What should I watch for while using this medicine? This drug may make you feel generally unwell. Report any side effects. Continue your course of treatment even though you feel ill unless your doctor tells you to stop. This  medicine can cause serious allergic reactions. To reduce your risk you may need to take medicine before treatment with this medicine. Take your medicine as directed. This medicine can affect the results of blood tests to match your blood type. These changes can last for up to 6 months after the final dose. Your healthcare provider will do blood tests to match your blood type before you start treatment. Tell all of your healthcare providers that you are being treated with this medicine before receiving a blood transfusion. This medicine can affect the results of some tests used to determine treatment response; extra tests may be needed to evaluate response. Do not become pregnant while taking this medicine or for 3 months after stopping it. Women should inform their doctor if they wish to become pregnant or think they might be pregnant. There is a potential for serious side effects to an unborn child. Talk to your health care professional or pharmacist for more information. What side effects may I notice from receiving this medicine? Side effects that you should report to your doctor or health care professional as soon as possible:  allergic reactions like skin rash, itching or hives, swelling of the face, lips, or tongue  breathing problems  chills  cough  dizziness  feeling faint or lightheaded  headache  low blood counts - this medicine may decrease the number of white blood cells, red blood cells and platelets. You may be at increased risk for infections and bleeding.  nausea, vomiting  shortness of breath  signs of decreased platelets or bleeding - bruising, pinpoint red spots on  the skin, black, tarry stools, blood in the urine  signs of decreased red blood cells - unusually weak or tired, feeling faint or lightheaded, falls  signs of infection - fever or chills, cough, sore throat, pain or difficulty passing urine  signs and symptoms of liver injury like dark yellow or brown  urine; general ill feeling or flu-like symptoms; light-colored stools; loss of appetite; right upper belly pain; unusually weak or tired; yellowing of the eyes or skin Side effects that usually do not require medical attention (report to your doctor or health care professional if they continue or are bothersome):  back pain  constipation  diarrhea  joint pain  muscle cramps  pain, tingling, numbness in the hands or feet  swelling of the ankles, feet, hands  tiredness  trouble sleeping This list may not describe all possible side effects. Call your doctor for medical advice about side effects. You may report side effects to FDA at 1-800-FDA-1088. Where should I keep my medicine? This drug is given in a hospital or clinic and will not be stored at home. NOTE: This sheet is a summary. It may not cover all possible information. If you have questions about this medicine, talk to your doctor, pharmacist, or health care provider.  2020 Elsevier/Gold Standard (2018-09-24 18:10:54)  

## 2019-07-24 NOTE — Telephone Encounter (Signed)
Appointments scheduled calendar to be printed before she leaves office per 6/24 los

## 2019-07-24 NOTE — Progress Notes (Signed)
Okay to receive Feraheme today per Otilio Carpen, Financial Advocate.

## 2019-07-25 LAB — IGG, IGA, IGM
IgA: 22 mg/dL — ABNORMAL LOW (ref 87–352)
IgG (Immunoglobin G), Serum: 312 mg/dL — ABNORMAL LOW (ref 586–1602)
IgM (Immunoglobulin M), Srm: <5 mg/dL — ABNORMAL LOW (ref 26–217)

## 2019-07-25 LAB — KAPPA/LAMBDA LIGHT CHAINS
Kappa free light chain: 4.1 mg/L (ref 3.3–19.4)
Kappa, lambda light chain ratio: 0.03 — ABNORMAL LOW (ref 0.26–1.65)
Lambda free light chains: 158 mg/L — ABNORMAL HIGH (ref 5.7–26.3)

## 2019-07-28 LAB — PROTEIN ELECTROPHORESIS, SERUM, WITH REFLEX
A/G Ratio: 1.3 (ref 0.7–1.7)
Albumin ELP: 3.5 g/dL (ref 2.9–4.4)
Alpha-1-Globulin: 0.2 g/dL (ref 0.0–0.4)
Alpha-2-Globulin: 1 g/dL (ref 0.4–1.0)
Beta Globulin: 1 g/dL (ref 0.7–1.3)
Gamma Globulin: 0.4 g/dL (ref 0.4–1.8)
Globulin, Total: 2.6 g/dL (ref 2.2–3.9)
M-Spike, %: 0.1 g/dL — ABNORMAL HIGH
SPEP Interpretation: 0
Total Protein ELP: 6.1 g/dL (ref 6.0–8.5)

## 2019-07-28 LAB — IMMUNOFIXATION REFLEX, SERUM
IgA: 21 mg/dL — ABNORMAL LOW (ref 87–352)
IgG (Immunoglobin G), Serum: 322 mg/dL — ABNORMAL LOW (ref 586–1602)
IgM (Immunoglobulin M), Srm: <5 mg/dL — ABNORMAL LOW (ref 26–217)

## 2019-07-30 ENCOUNTER — Encounter: Payer: Self-pay | Admitting: Internal Medicine

## 2019-07-30 ENCOUNTER — Other Ambulatory Visit: Payer: Self-pay

## 2019-07-30 ENCOUNTER — Ambulatory Visit (INDEPENDENT_AMBULATORY_CARE_PROVIDER_SITE_OTHER): Payer: Medicare Other | Admitting: Internal Medicine

## 2019-07-30 VITALS — BP 114/66 | HR 75 | Temp 98.9°F | Ht 64.0 in | Wt 189.4 lb

## 2019-07-30 DIAGNOSIS — R600 Localized edema: Secondary | ICD-10-CM

## 2019-07-30 DIAGNOSIS — Z1159 Encounter for screening for other viral diseases: Secondary | ICD-10-CM | POA: Diagnosis not present

## 2019-07-30 DIAGNOSIS — Z1231 Encounter for screening mammogram for malignant neoplasm of breast: Secondary | ICD-10-CM

## 2019-07-30 DIAGNOSIS — I10 Essential (primary) hypertension: Secondary | ICD-10-CM | POA: Diagnosis not present

## 2019-07-30 NOTE — Patient Instructions (Signed)
Exercising to Stay Healthy To become healthy and stay healthy, it is recommended that you do moderate-intensity and vigorous-intensity exercise. You can tell that you are exercising at a moderate intensity if your heart starts beating faster and you start breathing faster but can still hold a conversation. You can tell that you are exercising at a vigorous intensity if you are breathing much harder and faster and cannot hold a conversation while exercising. Exercising regularly is important. It has many health benefits, such as:  Improving overall fitness, flexibility, and endurance.  Increasing bone density.  Helping with weight control.  Decreasing body fat.  Increasing muscle strength.  Reducing stress and tension.  Improving overall health. How often should I exercise? Choose an activity that you enjoy, and set realistic goals. Your health care provider can help you make an activity plan that works for you. Exercise regularly as told by your health care provider. This may include:  Doing strength training two times a week, such as: ? Lifting weights. ? Using resistance bands. ? Push-ups. ? Sit-ups. ? Yoga.  Doing a certain intensity of exercise for a given amount of time. Choose from these options: ? A total of 150 minutes of moderate-intensity exercise every week. ? A total of 75 minutes of vigorous-intensity exercise every week. ? A mix of moderate-intensity and vigorous-intensity exercise every week. Children, pregnant women, people who have not exercised regularly, people who are overweight, and older adults may need to talk with a health care provider about what activities are safe to do. If you have a medical condition, be sure to talk with your health care provider before you start a new exercise program. What are some exercise ideas? Moderate-intensity exercise ideas include:  Walking 1 mile (1.6 km) in about 15  minutes.  Biking.  Hiking.  Golfing.  Dancing.  Water aerobics. Vigorous-intensity exercise ideas include:  Walking 4.5 miles (7.2 km) or more in about 1 hour.  Jogging or running 5 miles (8 km) in about 1 hour.  Biking 10 miles (16.1 km) or more in about 1 hour.  Lap swimming.  Roller-skating or in-line skating.  Cross-country skiing.  Vigorous competitive sports, such as football, basketball, and soccer.  Jumping rope.  Aerobic dancing. What are some everyday activities that can help me to get exercise?  Yard work, such as: ? Pushing a lawn mower. ? Raking and bagging leaves.  Washing your car.  Pushing a stroller.  Shoveling snow.  Gardening.  Washing windows or floors. How can I be more active in my day-to-day activities?  Use stairs instead of an elevator.  Take a walk during your lunch break.  If you drive, park your car farther away from your work or school.  If you take public transportation, get off one stop early and walk the rest of the way.  Stand up or walk around during all of your indoor phone calls.  Get up, stretch, and walk around every 30 minutes throughout the day.  Enjoy exercise with a friend. Support to continue exercising will help you keep a regular routine of activity. What guidelines can I follow while exercising?  Before you start a new exercise program, talk with your health care provider.  Do not exercise so much that you hurt yourself, feel dizzy, or get very short of breath.  Wear comfortable clothes and wear shoes with good support.  Drink plenty of water while you exercise to prevent dehydration or heat stroke.  Work out until your breathing   and your heartbeat get faster. Where to find more information  [place] Department of Health and Human Services: www.hhs.gov  Centers for Disease Control and Prevention (CDC): www.cdc.gov Summary  Exercising regularly is important. It will improve your overall fitness,  flexibility, and endurance.  Regular exercise also will improve your overall health. It can help you control your weight, reduce stress, and improve your bone density.  Do not exercise so much that you hurt yourself, feel dizzy, or get very short of breath.  Before you start a new exercise program, talk with your health care provider. This information is not intended to replace advice given to you by your health care provider. Make sure you discuss any questions you have with your health care provider. Document Revised: 12/29/2016 Document Reviewed: 12/07/2016 Elsevier Patient Education  2020 Elsevier Inc.  

## 2019-08-04 NOTE — Progress Notes (Signed)
This visit occurred during the SARS-CoV-2 public health emergency.  Safety protocols were in place, including screening questions prior to the visit, additional usage of staff PPE, and extensive cleaning of exam room while observing appropriate contact time as indicated for disinfecting solutions.  Subjective:     Patient ID: Emily Greer , female    DOB: Mar 10, 1950 , 69 y.o.   MRN: [id number]   Chief Complaint  Patient presents with  . Hypertension  . Edema    left lower extermity    HPI  She presents for BP check. She reports compliance with meds. She does admit to LE edema. She thinks left leg is more swollen than her right leg. Denies fall/trauma. Admits she may not drink enough fluids throughout the day.   Hypertension This is a chronic problem. The current episode started more than 1 year ago. The problem has been gradually improving since onset. The problem is controlled. Associated symptoms include peripheral edema. Pertinent negatives include no blurred vision, chest pain or shortness of breath.     Past Medical History:  Diagnosis Date  . Avascular necrosis of femoral head (HCC)    bilateral per CT 07-26-2015  . Endometrial carcinoma [facility]) gyn oncologist-  dr Nelly Rout ([facility])/  radiation oncologist-- dr Roselind Messier   2013 dx  FIGO Stage 1A, Grade 2 endometrioid endometrial cancer s/p TAH w/ BSO and bilateral pelvic node dissection 10-31-2011 ;  recurrence at distal vagina 04/ 2014 s/p  brachytherapy (ended 07-29-2012);  2nd recurrence 12/ 2016  vaginal apex s/p  conformational radiotherapy 03-10-2015 to 04-20-2015  . Family history of adverse reaction to anesthesia    mother ponv  . GERD (gastroesophageal reflux disease)   . H/O stem cell transplant (HCC)    02/ 2000 and second one 06/ 2016  . 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 History of chemotherapy    last chemo 12-26-17  . History of radiation therapy  6/4, 6/11, 6/19, 6/25, 07/29/2012   vagina 30.5 gray in 5 fx, HDR brachytherapy:   last radiation to vagina 03-10-2015 to 04-20-2015  50.4gray  . History of radiation therapy 03/10/15-04/20/15   vagina 50.4 in 28 fractions  . Hypertension   . Lambda light chain myeloma [facility]) oncologist-  dr Myna Hidalgo ([facility])  and  [facility] -- dr Silvestre Mesi gasparetto   dx 07/ 1999 s/p  VAD chemotherapy 11/ 1999,  purged autotransplant 03-21-1998 followed by auto stem cell transplant 03-29-1998;  recurrance w/ second autologous stem cell transplant 07-24-2014;  in Re-mission currently , chemo maintenance therapy  . Osteoporosis 12/18/05   Increased  risk   . PONV (postoperative nausea and vomiting)   . Wears glasses      Family History  Problem Relation Age of Onset  . Colon cancer Mother   . Hypertension Father   . Heart Problems Father      Current Outpatient Medications:  .  acyclovir (ZOVIRAX) 400 MG tablet, TAKE 1 TABLET(400 MG) BY MOUTH TWICE DAILY, Disp: 60 tablet, Rfl: 11 .  albuterol (VENTOLIN HFA) 108 (90 Base) MCG/ACT inhaler, Inhale 2 puffs into the lungs every 6 (six) hours as needed for wheezing or shortness of breath. 2 puffs 3 times daily x 5 days then every 6 hours as needed., Disp: 18 g, Rfl: 11 .  amLODipine (NORVASC) 10 MG tablet, Take 1 tablet (10 mg total) by mouth every morning., Disp: 90 tablet, Rfl: 2 .  aspirin EC 81 MG tablet, Take 81 mg by mouth 2 (two) times daily. Stopped  By dr Myna Hidalgo until urology procedures complete, Disp: , Rfl:  .  Calcium-Magnesium-Vitamin D (CALCIUM 1200+D3 PO), Take 1 tablet by mouth daily., Disp: , Rfl:  .  Cholecalciferol (VITAMIN D3) 2000 UNITS TABS, Take 2 tablets by mouth daily. , Disp: , Rfl:  .  daratumumab (DARZALEX) 400 MG/20ML, Inject into the vein., Disp: , Rfl:  .  hydrochlorothiazide (MICROZIDE) 12.5 MG capsule, Take 12.5 mg by mouth every morning., Disp: , Rfl:  .  letrozole (FEMARA) 2.5 MG tablet, Take 1 tablet (2.5 mg total) by  mouth daily., Disp: 60 tablet, Rfl: 4 .  loperamide (IMODIUM) 2 MG capsule, Take by mouth as needed for diarrhea or loose stools. Reported on 08/19/2015, Disp: , Rfl:  .  loratadine (CLARITIN) 10 MG tablet, Take 10 mg by mouth every morning. Reported on 08/19/2015, Disp: , Rfl:  .  LORazepam (ATIVAN) 0.5 MG tablet, Take 1 tablet (0.5 mg total) by mouth every 6 (six) hours as needed (Nausea or vomiting)., Disp: 30 tablet, Rfl: 0 .  metoprolol succinate (TOPROL-XL) 50 MG 24 hr tablet, Take 1 tablet (50 mg total) by mouth every morning., Disp: 90 tablet, Rfl: 3 .  montelukast (SINGULAIR) 10 MG tablet, TAKE 1 TABLET(10 MG) BY MOUTH AT BEDTIME, Disp: 90 tablet, Rfl: 2 .  pomalidomide (POMALYST) 1 MG capsule, TAKE 1 CAPSULE BY MOUTH ONCE DAILY FOR 21 DAYS ON AND 7 DAYS OFF Auth# [id number], Disp: 21 capsule, Rfl: 0 .  potassium chloride SA (KLOR-CON) 20 MEQ tablet, TAKE 1 TABLET(20 MEQ) BY MOUTH TWICE DAILY, Disp: 60 tablet, Rfl: 3 .  Probiotic Product (PROBIOTIC DAILY PO), Take by mouth daily., Disp: , Rfl:  .  triamcinolone (NASACORT ALLERGY 24HR) 55 MCG/ACT AERO nasal inhaler, Place 2 sprays into the nose as needed. , Disp: , Rfl:  .  triamterene-hydrochlorothiazide (MAXZIDE-25) 37.5-25 MG tablet, 1 tablet by mouth at bedtime, Disp: 90 tablet, Rfl: 2 No current facility-administered medications for this visit.  Facility-Administered Medications Ordered in Other Visits:  .  sodium chloride flush (NS) 0.9 % injection 10 mL, 10 mL, Intravenous, PRN, [place], Sarah M, NP, 10 mL at 12/26/17 1130 .  sodium chloride flush (NS) 0.9 % injection 10 mL, 10 mL, Intravenous, PRN, Josph Macho, MD, 10 mL at 04/24/19 1305   Allergies  Allergen Reactions  . Codeine Nausea Only     Review of Systems  Constitutional: Negative.   Eyes: Negative for blurred vision.  Respiratory: Negative.  Negative for shortness of breath.   Cardiovascular: Negative.  Negative for chest pain.  Gastrointestinal: Negative.    Neurological: Negative.   Psychiatric/Behavioral: Negative.      Today's Vitals   07/30/19 1429  BP: 114/66  Pulse: 75  Temp: 98.9 F (37.2 C)  TempSrc: Oral  Weight: 189 lb 6.4 oz (85.9 kg)  Height: 5\' 4"  (1.626 m)  PainSc: 0-No pain   Body mass index is 32.51 kg/m.   Objective:  Physical Exam Vitals and nursing note reviewed.  Constitutional:      Appearance: Normal appearance.  HENT:     Head: Normocephalic and atraumatic.  Cardiovascular:     Rate and Rhythm: Normal rate and regular rhythm.     Heart sounds: Normal heart sounds.  Pulmonary:     Effort: Pulmonary effort is normal.     Breath sounds: Normal breath sounds.  Skin:    General: Skin is warm.  Neurological:     General: No focal deficit present.     Mental Status: She is alert.  Psychiatric:        Mood and Affect: Mood normal.        Behavior: Behavior normal.         Assessment And Plan:   1. Essential (primary) hypertension  Chronic, well controlled.  She will continue with current meds. Recent renal labs were reviewed in full detail. She agrees to have labwork performed at next hem/onc visit. She will rto in six months for re-evaluation.   - Lipid panel; Future  2. Edema of left lower extremity  She is encouraged to elevate her legs when seated. She is also encouraged to increase her water intake. She will let me know if her sx persist.   3. Encounter for screening mammogram for malignant neoplasm of breast  She agrees to get yearly mammogram. Previously seen at [facility], she agrees with referral to Henreitta Leber, PA for Gyn exam.   - Ambulatory referral to Gynecology  4. Encounter for HCV screening test for low risk patient  - Hepatitis C antibody; Future    Gwynneth Aliment, MD    THE PATIENT IS ENCOURAGED TO PRACTICE SOCIAL DISTANCING DUE TO THE COVID-19 PANDEMIC.

## 2019-08-12 ENCOUNTER — Other Ambulatory Visit: Payer: Self-pay | Admitting: *Deleted

## 2019-08-12 ENCOUNTER — Inpatient Hospital Stay: Payer: Medicare Other | Attending: Hematology & Oncology

## 2019-08-12 ENCOUNTER — Other Ambulatory Visit: Payer: Self-pay

## 2019-08-12 DIAGNOSIS — R202 Paresthesia of skin: Secondary | ICD-10-CM | POA: Diagnosis not present

## 2019-08-12 DIAGNOSIS — Z79899 Other long term (current) drug therapy: Secondary | ICD-10-CM | POA: Insufficient documentation

## 2019-08-12 DIAGNOSIS — Z5111 Encounter for antineoplastic chemotherapy: Secondary | ICD-10-CM | POA: Insufficient documentation

## 2019-08-12 DIAGNOSIS — C9000 Multiple myeloma not having achieved remission: Secondary | ICD-10-CM

## 2019-08-12 DIAGNOSIS — D500 Iron deficiency anemia secondary to blood loss (chronic): Secondary | ICD-10-CM

## 2019-08-12 DIAGNOSIS — R200 Anesthesia of skin: Secondary | ICD-10-CM | POA: Diagnosis not present

## 2019-08-12 DIAGNOSIS — D509 Iron deficiency anemia, unspecified: Secondary | ICD-10-CM | POA: Insufficient documentation

## 2019-08-12 DIAGNOSIS — R61 Generalized hyperhidrosis: Secondary | ICD-10-CM | POA: Diagnosis not present

## 2019-08-12 DIAGNOSIS — R232 Flushing: Secondary | ICD-10-CM | POA: Insufficient documentation

## 2019-08-12 DIAGNOSIS — C541 Malignant neoplasm of endometrium: Secondary | ICD-10-CM | POA: Insufficient documentation

## 2019-08-12 DIAGNOSIS — R5383 Other fatigue: Secondary | ICD-10-CM | POA: Diagnosis not present

## 2019-08-12 MED ORDER — POMALIDOMIDE 1 MG PO CAPS: ORAL_CAPSULE | ORAL | 0 refills | Status: DC

## 2019-08-13 LAB — UPEP/UIFE/LIGHT CHAINS/TP, 24-HR UR
% BETA, Urine: 9.9 %
ALPHA 1 URINE: 2 %
Albumin, U: 25.8 %
Alpha 2, Urine: 4.4 %
Free Kappa Lt Chains,Ur: 8.35 mg/L (ref 0.63–113.79)
Free Kappa/Lambda Ratio: 0.02 — ABNORMAL LOW (ref 1.03–31.76)
Free Lambda Lt Chains,Ur: 412.62 mg/L — ABNORMAL HIGH (ref 0.47–11.77)
GAMMA GLOBULIN URINE: 57.8 %
M-SPIKE %, Urine: 45.7 % — ABNORMAL HIGH
M-Spike, Mg/24 Hr: 135 mg/24 hr — ABNORMAL HIGH
Total Protein, Urine-Ur/day: 295 mg/24 hr — ABNORMAL HIGH (ref 30–150)
Total Protein, Urine: 29.5 mg/dL
Total Volume: 1000

## 2019-08-19 DIAGNOSIS — H25813 Combined forms of age-related cataract, bilateral: Secondary | ICD-10-CM | POA: Diagnosis not present

## 2019-08-19 DIAGNOSIS — H04123 Dry eye syndrome of bilateral lacrimal glands: Secondary | ICD-10-CM | POA: Diagnosis not present

## 2019-08-21 ENCOUNTER — Inpatient Hospital Stay: Payer: Medicare Other

## 2019-08-21 ENCOUNTER — Inpatient Hospital Stay (HOSPITAL_BASED_OUTPATIENT_CLINIC_OR_DEPARTMENT_OTHER): Payer: Medicare Other | Admitting: Family

## 2019-08-21 ENCOUNTER — Encounter: Payer: Self-pay | Admitting: Family

## 2019-08-21 ENCOUNTER — Other Ambulatory Visit: Payer: Self-pay

## 2019-08-21 VITALS — BP 136/70 | HR 80 | Temp 97.8°F | Resp 18 | Ht 64.0 in

## 2019-08-21 DIAGNOSIS — Z95828 Presence of other vascular implants and grafts: Secondary | ICD-10-CM

## 2019-08-21 DIAGNOSIS — C9000 Multiple myeloma not having achieved remission: Secondary | ICD-10-CM

## 2019-08-21 DIAGNOSIS — R5383 Other fatigue: Secondary | ICD-10-CM | POA: Diagnosis not present

## 2019-08-21 DIAGNOSIS — Z7189 Other specified counseling: Secondary | ICD-10-CM | POA: Diagnosis not present

## 2019-08-21 DIAGNOSIS — D500 Iron deficiency anemia secondary to blood loss (chronic): Secondary | ICD-10-CM | POA: Diagnosis not present

## 2019-08-21 DIAGNOSIS — C9001 Multiple myeloma in remission: Secondary | ICD-10-CM | POA: Diagnosis not present

## 2019-08-21 DIAGNOSIS — C541 Malignant neoplasm of endometrium: Secondary | ICD-10-CM | POA: Diagnosis not present

## 2019-08-21 DIAGNOSIS — Z5111 Encounter for antineoplastic chemotherapy: Secondary | ICD-10-CM | POA: Diagnosis not present

## 2019-08-21 DIAGNOSIS — R232 Flushing: Secondary | ICD-10-CM | POA: Diagnosis not present

## 2019-08-21 DIAGNOSIS — D509 Iron deficiency anemia, unspecified: Secondary | ICD-10-CM | POA: Diagnosis not present

## 2019-08-21 HISTORY — DX: Other specified counseling: Z71.89

## 2019-08-21 LAB — CBC WITH DIFFERENTIAL (CANCER CENTER ONLY)
Abs Immature Granulocytes: 0.02 10*3/uL (ref 0.00–0.07)
Basophils Absolute: 0.1 10*3/uL (ref 0.0–0.1)
Basophils Relative: 2 %
Eosinophils Absolute: 0.2 10*3/uL (ref 0.0–0.5)
Eosinophils Relative: 5 %
HCT: 33.2 % — ABNORMAL LOW (ref 36.0–46.0)
Hemoglobin: 10.8 g/dL — ABNORMAL LOW (ref 12.0–15.0)
Immature Granulocytes: 1 %
Lymphocytes Relative: 17 %
Lymphs Abs: 0.7 10*3/uL (ref 0.7–4.0)
MCH: 27.7 pg (ref 26.0–34.0)
MCHC: 32.5 g/dL (ref 30.0–36.0)
MCV: 85.1 fL (ref 80.0–100.0)
Monocytes Absolute: 0.7 10*3/uL (ref 0.1–1.0)
Monocytes Relative: 15 %
Neutro Abs: 2.6 10*3/uL (ref 1.7–7.7)
Neutrophils Relative %: 60 %
Platelet Count: 205 10*3/uL (ref 150–400)
RBC: 3.9 MIL/uL (ref 3.87–5.11)
RDW: 15.9 % — ABNORMAL HIGH (ref 11.5–15.5)
WBC Count: 4.3 10*3/uL (ref 4.0–10.5)
nRBC: 0 % (ref 0.0–0.2)

## 2019-08-21 LAB — CMP (CANCER CENTER ONLY)
ALT: 13 U/L (ref 0–44)
AST: 10 U/L — ABNORMAL LOW (ref 15–41)
Albumin: 4.5 g/dL (ref 3.5–5.0)
Alkaline Phosphatase: 35 U/L — ABNORMAL LOW (ref 38–126)
Anion gap: 7 (ref 5–15)
BUN: 16 mg/dL (ref 8–23)
CO2: 26 mmol/L (ref 22–32)
Calcium: 9.4 mg/dL (ref 8.9–10.3)
Chloride: 109 mmol/L (ref 98–111)
Creatinine: 0.82 mg/dL (ref 0.44–1.00)
GFR, Est AFR Am: >60 mL/min (ref >60)
GFR, Estimated: >60 mL/min (ref >60)
Glucose, Bld: 100 mg/dL — ABNORMAL HIGH (ref 70–99)
Potassium: 3.6 mmol/L (ref 3.5–5.1)
Sodium: 142 mmol/L (ref 135–145)
Total Bilirubin: 0.5 mg/dL (ref 0.3–1.2)
Total Protein: 6.2 g/dL — ABNORMAL LOW (ref 6.5–8.1)

## 2019-08-21 MED ORDER — HEPARIN SOD (PORK) LOCK FLUSH 100 UNIT/ML IV SOLN
500.0000 [IU] | Freq: Once | INTRAVENOUS | Status: AC
  Administered 2019-08-21: 500 [IU] via INTRAVENOUS
  Filled 2019-08-21: qty 5

## 2019-08-21 MED ORDER — SODIUM CHLORIDE 0.9% FLUSH
10.0000 mL | INTRAVENOUS | Status: DC | PRN
  Administered 2019-08-21: 10 mL via INTRAVENOUS
  Filled 2019-08-21: qty 10

## 2019-08-21 MED ORDER — DEXAMETHASONE 4 MG PO TABS
ORAL_TABLET | ORAL | Status: AC
  Filled 2019-08-21: qty 5

## 2019-08-21 MED ORDER — DIPHENHYDRAMINE HCL 25 MG PO CAPS
ORAL_CAPSULE | ORAL | Status: AC
  Filled 2019-08-21: qty 2

## 2019-08-21 MED ORDER — DENOSUMAB 120 MG/1.7ML ~~LOC~~ SOLN
SUBCUTANEOUS | Status: AC
  Filled 2019-08-21: qty 1.7

## 2019-08-21 MED ORDER — LORAZEPAM 0.5 MG PO TABS: 0.5000 mg | ORAL_TABLET | Freq: Four times a day (QID) | ORAL | 0 refills | Status: DC | PRN

## 2019-08-21 MED ORDER — ACETAMINOPHEN 325 MG PO TABS
ORAL_TABLET | ORAL | Status: AC
  Filled 2019-08-21: qty 2

## 2019-08-21 MED ORDER — DENOSUMAB 120 MG/1.7ML ~~LOC~~ SOLN
120.0000 mg | Freq: Once | SUBCUTANEOUS | Status: AC
  Administered 2019-08-21: 120 mg via SUBCUTANEOUS

## 2019-08-21 NOTE — Patient Instructions (Signed)
Denosumab injection What is this medicine? DENOSUMAB (den oh sue mab) slows bone breakdown. Prolia is used to treat osteoporosis in women after menopause and in men, and in people who are taking corticosteroids for 6 months or more. Xgeva is used to treat a high calcium level due to cancer and to prevent bone fractures and other bone problems caused by multiple myeloma or cancer bone metastases. Xgeva is also used to treat giant cell tumor of the bone. This medicine may be used for other purposes; ask your health care provider or pharmacist if you have questions. COMMON BRAND NAME(S): Prolia, XGEVA What should I tell my health care provider before I take this medicine? They need to know if you have any of these conditions:  dental disease  having surgery or tooth extraction  infection  kidney disease  low levels of calcium or Vitamin D in the blood  malnutrition  on hemodialysis  skin conditions or sensitivity  thyroid or parathyroid disease  an unusual reaction to denosumab, other medicines, foods, dyes, or preservatives  pregnant or trying to get pregnant  breast-feeding How should I use this medicine? This medicine is for injection under the skin. It is given by a health care professional in a hospital or clinic setting. A special MedGuide will be given to you before each treatment. Be sure to read this information carefully each time. For Prolia, talk to your pediatrician regarding the use of this medicine in children. Special care may be needed. For Xgeva, talk to your pediatrician regarding the use of this medicine in children. While this drug may be prescribed for children as young as 13 years for selected conditions, precautions do apply. Overdosage: If you think you have taken too much of this medicine contact a poison control center or emergency room at once. NOTE: This medicine is only for you. Do not share this medicine with others. What if I miss a dose? It is  important not to miss your dose. Call your doctor or health care professional if you are unable to keep an appointment. What may interact with this medicine? Do not take this medicine with any of the following medications:  other medicines containing denosumab This medicine may also interact with the following medications:  medicines that lower your chance of fighting infection  steroid medicines like prednisone or cortisone This list may not describe all possible interactions. Give your health care provider a list of all the medicines, herbs, non-prescription drugs, or dietary supplements you use. Also tell them if you smoke, drink alcohol, or use illegal drugs. Some items may interact with your medicine. What should I watch for while using this medicine? Visit your doctor or health care professional for regular checks on your progress. Your doctor or health care professional may order blood tests and other tests to see how you are doing. Call your doctor or health care professional for advice if you get a fever, chills or sore throat, or other symptoms of a cold or flu. Do not treat yourself. This drug may decrease your body's ability to fight infection. Try to avoid being around people who are sick. You should make sure you get enough calcium and vitamin D while you are taking this medicine, unless your doctor tells you not to. Discuss the foods you eat and the vitamins you take with your health care professional. See your dentist regularly. Brush and floss your teeth as directed. Before you have any dental work done, tell your dentist you are   receiving this medicine. Do not become pregnant while taking this medicine or for 5 months after stopping it. Talk with your doctor or health care professional about your birth control options while taking this medicine. Women should inform their doctor if they wish to become pregnant or think they might be pregnant. There is a potential for serious side  effects to an unborn child. Talk to your health care professional or pharmacist for more information. What side effects may I notice from receiving this medicine? Side effects that you should report to your doctor or health care professional as soon as possible:  allergic reactions like skin rash, itching or hives, swelling of the face, lips, or tongue  bone pain  breathing problems  dizziness  jaw pain, especially after dental work  redness, blistering, peeling of the skin  signs and symptoms of infection like fever or chills; cough; sore throat; pain or trouble passing urine  signs of low calcium like fast heartbeat, muscle cramps or muscle pain; pain, tingling, numbness in the hands or feet; seizures  unusual bleeding or bruising  unusually weak or tired Side effects that usually do not require medical attention (report to your doctor or health care professional if they continue or are bothersome):  constipation  diarrhea  headache  joint pain  loss of appetite  muscle pain  runny nose  tiredness  upset stomach This list may not describe all possible side effects. Call your doctor for medical advice about side effects. You may report side effects to FDA at 1-800-FDA-1088. Where should I keep my medicine? This medicine is only given in a clinic, doctor's office, or other health care setting and will not be stored at home. NOTE: This sheet is a summary. It may not cover all possible information. If you have questions about this medicine, talk to your doctor, pharmacist, or health care provider.  2020 Elsevier/Gold Standard (2017-05-25 16:10:44)

## 2019-08-21 NOTE — Progress Notes (Signed)
Hematology and Oncology Follow Up Visit  Emily Greer [id number] Aug 02, 1950 69 y.o. 08/21/2019   Principle Diagnosis:  Recurrent lambda light chain myeloma - nl cytogenetics History of recurrent endometrial carcinoma Iron deficiency anemia-blood loss  PastTherapy: Status post second autologous stem cell transplant on 07/24/2014 Maintenance therapy with Pomalidomide/every 2 week Velcade - d/c'ed Radiation therapy for endometrial recurrence - completed 04/20/2015 Pomalyst/Kyprolis 70mg /m2 IV q 2 weeks - s/p cycle #12 - held on 12/26/2017 for hematuria  Current Therapy: Daratumumab/Pomalyst (1 mg) - status post cycle19 -- d/c on 08/21/2019 Melflufen 40 mg IV q 4 weeks -- start on 08/27/2019 Femara 2.5 mg po q day Xgeva 120 mg subcu every 3 months - next dose in 07/2019 IV iron as indicated   Interim History:  Emily Greer is here today for follow-up and treatment. She is doing fairly well but is feeling fatigued. She has been staying busy taking care of her husband, who is slowly improving, and mother. Her son and his family have come to stay with them and help.  Her M-spike last month was 0.1, IgG level 312 mg/dL and lambda light chains 15.8 mg/dL.  She states that she is doing well still on the Pomalyst.  She has the occasional hot flash and night sweats with Femara. No other issues.  No fever, chills, n/v, cough, rash, dizziness, SOB, chest pain, palpitations, abdominal pain or changes in bowel or bladder habits. She has not noticed any blood loss. No bruising or petechiae.  No swelling or tenderness noted in her extremities.  The numbness and tingling in her hands and feet is stable. No falls or syncopal episodes to report.  She states that she is eating well and hydrating properly. Her weight is stable.   ECOG Performance Status: 1 - Symptomatic but completely ambulatory  Medications:  Allergies as of 08/21/2019      Reactions   Codeine Nausea  Only      Medication List       Accurate as of August 21, 2019 11:13 AM. If you have any questions, ask your nurse or doctor.        acyclovir 400 MG tablet Commonly known as: ZOVIRAX TAKE 1 TABLET(400 MG) BY MOUTH TWICE DAILY   albuterol 108 (90 Base) MCG/ACT inhaler Commonly known as: VENTOLIN HFA Inhale 2 puffs into the lungs every 6 (six) hours as needed for wheezing or shortness of breath. 2 puffs 3 times daily x 5 days then every 6 hours as needed.   amLODipine 10 MG tablet Commonly known as: NORVASC Take 1 tablet (10 mg total) by mouth every morning.   aspirin EC 81 MG tablet Take 81 mg by mouth 2 (two) times daily. Stopped  By dr Myna Hidalgo until urology procedures complete   CALCIUM 1200+D3 PO Take 1 tablet by mouth daily.   daratumumab 400 MG/20ML Commonly known as: DARZALEX Inject into the vein.   hydrochlorothiazide 12.5 MG capsule Commonly known as: MICROZIDE Take 12.5 mg by mouth every morning.   letrozole 2.5 MG tablet Commonly known as: FEMARA Take 1 tablet (2.5 mg total) by mouth daily.   loperamide 2 MG capsule Commonly known as: IMODIUM Take by mouth as needed for diarrhea or loose stools. Reported on 08/19/2015   loratadine 10 MG tablet Commonly known as: CLARITIN Take 10 mg by mouth every morning. Reported on 08/19/2015   LORazepam 0.5 MG tablet Commonly known as: Ativan Take 1 tablet (0.5 mg total) by mouth every 6 (six) hours as needed (  Nausea or vomiting).   metoprolol succinate 50 MG 24 hr tablet Commonly known as: TOPROL-XL Take 1 tablet (50 mg total) by mouth every morning.   montelukast 10 MG tablet Commonly known as: SINGULAIR TAKE 1 TABLET(10 MG) BY MOUTH AT BEDTIME   Nasacort Allergy 24HR 55 MCG/ACT Aero nasal inhaler Generic drug: triamcinolone Place 2 sprays into the nose as needed.   pomalidomide 1 MG capsule Commonly known as: Pomalyst TAKE 1 CAPSULE BY MOUTH ONCE DAILY FOR 21 DAYS ON AND 7 DAYS OFF Auth# [id number]    potassium chloride SA 20 MEQ tablet Commonly known as: KLOR-CON TAKE 1 TABLET(20 MEQ) BY MOUTH TWICE DAILY   PROBIOTIC DAILY PO Take by mouth daily.   triamterene-hydrochlorothiazide 37.5-25 MG tablet Commonly known as: MAXZIDE-25 1 tablet by mouth at bedtime   Vitamin D3 50 MCG (2000 UT) Tabs Take 2 tablets by mouth daily.       Allergies:  Allergies  Allergen Reactions  . Codeine Nausea Only    Past Medical History, Surgical history, Social history, and Family History were reviewed and updated.  Review of Systems: All other 10 point review of systems is negative.   Physical Exam:  height is 5\' 4"  (1.626 m). Her oral temperature is 97.8 F (36.6 C). Her blood pressure is 136/70 (abnormal) and her pulse is 80. Her respiration is 18 and oxygen saturation is 100%.   Wt Readings from Last 3 Encounters:  07/30/19 189 lb 6.4 oz (85.9 kg)  06/26/19 195 lb 1.9 oz (88.5 kg)  05/30/19 194 lb 1.3 oz (88 kg)    Ocular: Sclerae unicteric, pupils equal, round and reactive to light Ear-nose-throat: Oropharynx clear, dentition fair Lymphatic: No cervical or supraclavicular adenopathy Lungs no rales or rhonchi, good excursion bilaterally Heart regular rate and rhythm, no murmur appreciated Abd soft, nontender, positive bowel sounds, no liver or spleen tip palpated on exam, no fluid wave  MSK no focal spinal tenderness, no joint edema Neuro: non-focal, well-oriented, appropriate affect Breasts: Deferred   Lab Results  Component Value Date   WBC 5.2 07/24/2019   HGB 10.3 (L) 07/24/2019   HCT 31.9 (L) 07/24/2019   MCV 85.5 07/24/2019   PLT 239 07/24/2019   Lab Results  Component Value Date   FERRITIN 1,892 (H) 07/24/2019   IRON 69 07/24/2019   TIBC 315 07/24/2019   UIBC 246 07/24/2019   IRONPCTSAT 22 07/24/2019   Lab Results  Component Value Date   RETICCTPCT 1.4 06/26/2019   RBC 3.73 (L) 07/24/2019   Lab Results  Component Value Date   KPAFRELGTCHN 4.1 07/24/2019    LAMBDASER 158.0 (H) 07/24/2019   KAPLAMBRATIO 0.02 (L) 08/12/2019   Lab Results  Component Value Date   IGGSERUM 312 (L) 07/24/2019   IGGSERUM 322 (L) 07/24/2019   IGA 22 (L) 07/24/2019   IGA 21 (L) 07/24/2019   IGMSERUM <5 (L) 07/24/2019   IGMSERUM <5 (L) 07/24/2019   Lab Results  Component Value Date   TOTALPROTELP 6.1 07/24/2019   ALBUMINELP 3.5 07/24/2019   A1GS 0.2 07/24/2019   A2GS 1.0 07/24/2019   BETS 1.0 07/24/2019   BETA2SER 0.4 11/23/2014   GAMS 0.4 07/24/2019   MSPIKE 0.1 (H) 07/24/2019   SPEI Comment 06/26/2019     Chemistry      Component Value Date/Time   NA 141 07/24/2019 0910   NA 141 01/10/2017 1115   NA 140 06/21/2016 0918   K 3.6 07/24/2019 0910   K 4.0 01/10/2017 1115  K 4.3 06/21/2016 0918   CL 106 07/24/2019 0910   CL 106 01/10/2017 1115   CO2 26 07/24/2019 0910   CO2 27 01/10/2017 1115   CO2 20 (L) 06/21/2016 0918   BUN 19 07/24/2019 0910   BUN 15 01/10/2017 1115   BUN 15.8 06/21/2016 0918   CREATININE 0.98 07/24/2019 0910   CREATININE 1.0 01/10/2017 1115   CREATININE 0.8 06/21/2016 0918      Component Value Date/Time   CALCIUM 9.8 07/24/2019 0910   CALCIUM 9.5 01/10/2017 1115   CALCIUM 9.4 06/21/2016 0918   ALKPHOS 40 07/24/2019 0910   ALKPHOS 40 01/10/2017 1115   ALKPHOS 66 06/21/2016 0918   AST 10 (L) 07/24/2019 0910   AST 17 06/21/2016 0918   ALT 15 07/24/2019 0910   ALT 19 01/10/2017 1115   ALT 37 06/21/2016 0918   BILITOT 0.4 07/24/2019 0910   BILITOT 0.32 06/21/2016 0918       Impression and Plan: Emily Greer is a very pleasant 69 yo African American female with recurrent lambda light chain myeloma. Her second a stem cell transplant for light chain myeloma was in June 2016. Shealsohaslocalized recurrent endometrial cancer (followed by gyn onc Dr. Nelly Rout) and is currently on Femara.  M-spike last month was 0.1 and lambda light chains were up to 15.8 mg/dL. At this point we will be discontinuing her Pomalyst and  Daratumumab and starting her on Melflufen every 4 weeks.  She will come back next week to start new treatment with Melflufen.  She can contact our office with any questions or concerns. We can certainly see her sooner if needed.   Emeline Gins, NP 7/22/202111:13 AM    ADDENDUM: I saw Emily Greer.  I did talk to her about the fact that her myeloma is now progressing.  We did a 24-hour urine on her.  The lambda light chain went from about 130 mg/day up to 440 mg/day.  Her serum lambda light chain is also increasing slowly but surely.  She has been on the daratumumab/pomalidomide for quite a while.  She has done quite well with this.  Again we will go to have to make a change.  Quality of life is still quite good.  I think that we should give Melflufen a try.  This works in a different mechanism than what she has had before.  Of note, she has normal cytogenetics with her myeloma.  I realize we have not done a bone marrow on her in 3 years.  We may have to consider doing one if she does not respond to the Melflufen.  I really hate that we have to make a change.  Her husband recently had a hemorrhagic stroke but he is getting better.  Thankfully, her family is helping out quite a bit.  We have been following her now for about 22 years.  She has done incredibly well considering that she has light chain myeloma.  We will try to plan to get things started next week.  We should know within a couple months as whether or not this is going to work.  Christin Bach, MD

## 2019-08-21 NOTE — Addendum Note (Signed)
Addended by: Rico Ala on: 08/21/2019 12:37 PM   Modules accepted: Orders, SmartSet

## 2019-08-21 NOTE — Patient Instructions (Signed)
Implanted Port Insertion, Care After °This sheet gives you information about how to care for yourself after your procedure. Your health care provider may also give you more specific instructions. If you have problems or questions, contact your health care provider. °What can I expect after the procedure? °After the procedure, it is common to have: °· Discomfort at the port insertion site. °· Bruising on the skin over the port. This should improve over 3-4 days. °Follow these instructions at home: °Port care °· After your port is placed, you will get a manufacturer's information card. The card has information about your port. Keep this card with you at all times. °· Take care of the port as told by your health care provider. Ask your health care provider if you or a family member can get training for taking care of the port at home. A home health care nurse may also take care of the port. °· Make sure to remember what type of port you have. °Incision care ° °  ° °· Follow instructions from your health care provider about how to take care of your port insertion site. Make sure you: °? Wash your hands with soap and water before and after you change your bandage (dressing). If soap and water are not available, use hand sanitizer. °? Change your dressing as told by your health care provider. °? Leave stitches (sutures), skin glue, or adhesive strips in place. These skin closures may need to stay in place for 2 weeks or longer. If adhesive strip edges start to loosen and curl up, you may trim the loose edges. Do not remove adhesive strips completely unless your health care provider tells you to do that. °· Check your port insertion site every day for signs of infection. Check for: °? Redness, swelling, or pain. °? Fluid or blood. °? Warmth. °? Pus or a bad smell. °Activity °· Return to your normal activities as told by your health care provider. Ask your health care provider what activities are safe for you. °· Do not  lift anything that is heavier than 10 lb (4.5 kg), or the limit that you are told, until your health care provider says that it is safe. °General instructions °· Take over-the-counter and prescription medicines only as told by your health care provider. °· Do not take baths, swim, or use a hot tub until your health care provider approves. Ask your health care provider if you may take showers. You may only be allowed to take sponge baths. °· Do not drive for 24 hours if you were given a sedative during your procedure. °· Wear a medical alert bracelet in case of an emergency. This will tell any health care providers that you have a port. °· Keep all follow-up visits as told by your health care provider. This is important. °Contact a health care provider if: °· You cannot flush your port with saline as directed, or you cannot draw blood from the port. °· You have a fever or chills. °· You have redness, swelling, or pain around your port insertion site. °· You have fluid or blood coming from your port insertion site. °· Your port insertion site feels warm to the touch. °· You have pus or a bad smell coming from the port insertion site. °Get help right away if: °· You have chest pain or shortness of breath. °· You have bleeding from your port that you cannot control. °Summary °· Take care of the port as told by your health   care provider. Keep the manufacturer's information card with you at all times. °· Change your dressing as told by your health care provider. °· Contact a health care provider if you have a fever or chills or if you have redness, swelling, or pain around your port insertion site. °· Keep all follow-up visits as told by your health care provider. °This information is not intended to replace advice given to you by your health care provider. Make sure you discuss any questions you have with your health care provider. °Document Revised: 08/14/2017 Document Reviewed: 08/14/2017 °Elsevier Patient Education ©  2020 [employer] ° °

## 2019-08-22 LAB — KAPPA/LAMBDA LIGHT CHAINS
Kappa free light chain: 2.5 mg/L — ABNORMAL LOW (ref 3.3–19.4)
Kappa, lambda light chain ratio: 0.01 — ABNORMAL LOW (ref 0.26–1.65)
Lambda free light chains: 184.5 mg/L — ABNORMAL HIGH (ref 5.7–26.3)

## 2019-08-22 LAB — IGG, IGA, IGM
IgA: 19 mg/dL — ABNORMAL LOW (ref 87–352)
IgG (Immunoglobin G), Serum: 299 mg/dL — ABNORMAL LOW (ref 586–1602)
IgM (Immunoglobulin M), Srm: <5 mg/dL — ABNORMAL LOW (ref 26–217)

## 2019-08-22 LAB — IRON AND TIBC
Iron: 91 ug/dL (ref 41–142)
Saturation Ratios: 29 % (ref 21–57)
TIBC: 314 ug/dL (ref 236–444)
UIBC: 223 ug/dL (ref 120–384)

## 2019-08-22 LAB — LACTATE DEHYDROGENASE: LDH: 165 U/L (ref 98–192)

## 2019-08-22 LAB — FERRITIN: Ferritin: 2207 ng/mL — ABNORMAL HIGH (ref 11–307)

## 2019-08-22 NOTE — Progress Notes (Signed)
Pharmacist Chemotherapy Monitoring - Initial Assessment    Anticipated start date: 08/28/19   Regimen:  . Are orders appropriate based on the patient's diagnosis, regimen, and cycle? Yes . Does the plan date match the patient's scheduled date? Yes . Is the sequencing of drugs appropriate? Yes . Are the premedications appropriate for the patient's regimen? Yes . Prior Authorization for treatment is: Approved o If applicable, is the correct biosimilar selected based on the patient's insurance? not applicable  Organ Function and Labs: [name] Kitchen Are dose adjustments needed based on the patient's renal function, hepatic function, or hematologic function? No . Are appropriate labs ordered prior to the start of patient's treatment? Yes . Other organ system assessment, if indicated: N/A . The following baseline labs, if indicated, have been ordered: N/A  Dose Assessment: . Are the drug doses appropriate? Yes . Are the following correct: o Drug concentrations Yes o IV fluid compatible with drug Yes o Administration routes Yes o Timing of therapy Yes  . If applicable, does the patient have documented access for treatment and/or plans for port-a-cath placement? yes . If applicable, have lifetime cumulative doses been properly documented and assessed? not applicable Lifetime Dose Tracking  No doses have been documented on this patient for the following tracked chemicals: Doxorubicin, Epirubicin, Idarubicin, Daunorubicin, Mitoxantrone, Bleomycin, Oxaliplatin, Carboplatin, Liposomal Doxorubicin  o   Toxicity Monitoring/Prevention: . The patient has the following take home antiemetics prescribed: Ondansetron . The patient has the following take home medications prescribed: N/A . Medication allergies and previous infusion related reactions, if applicable, have been reviewed and addressed. Yes . The patient's current medication list has been assessed for drug-drug interactions with their chemotherapy  regimen. no significant drug-drug interactions were identified on review.  Order Review: . Are the treatment plan orders signed? Yes . Is the patient scheduled to see a provider prior to their treatment? No  I verify that I have reviewed each item in the above checklist and answered each question accordingly.  Emily Greer, Jacqlyn Larsen 08/22/2019 2:41 PM

## 2019-08-26 LAB — PROTEIN ELECTROPHORESIS, SERUM, WITH REFLEX
A/G Ratio: 1.7 (ref 0.7–1.7)
Albumin ELP: 3.7 g/dL (ref 2.9–4.4)
Alpha-1-Globulin: 0.2 g/dL (ref 0.0–0.4)
Alpha-2-Globulin: 0.8 g/dL (ref 0.4–1.0)
Beta Globulin: 0.9 g/dL (ref 0.7–1.3)
Gamma Globulin: 0.3 g/dL — ABNORMAL LOW (ref 0.4–1.8)
Globulin, Total: 2.2 g/dL (ref 2.2–3.9)
M-Spike, %: 0.2 g/dL — ABNORMAL HIGH
SPEP Interpretation: 0
Total Protein ELP: 5.9 g/dL — ABNORMAL LOW (ref 6.0–8.5)

## 2019-08-26 LAB — IMMUNOFIXATION REFLEX, SERUM
IgA: 20 mg/dL — ABNORMAL LOW (ref 87–352)
IgG (Immunoglobin G), Serum: 338 mg/dL — ABNORMAL LOW (ref 586–1602)
IgM (Immunoglobulin M), Srm: <5 mg/dL — ABNORMAL LOW (ref 26–217)

## 2019-08-27 ENCOUNTER — Other Ambulatory Visit: Payer: Self-pay | Admitting: *Deleted

## 2019-08-27 DIAGNOSIS — C9000 Multiple myeloma not having achieved remission: Secondary | ICD-10-CM

## 2019-08-28 ENCOUNTER — Inpatient Hospital Stay: Payer: Medicare Other

## 2019-08-28 ENCOUNTER — Other Ambulatory Visit: Payer: Medicare Other

## 2019-08-28 ENCOUNTER — Other Ambulatory Visit: Payer: Self-pay

## 2019-08-28 ENCOUNTER — Ambulatory Visit: Payer: Medicare Other

## 2019-08-28 ENCOUNTER — Other Ambulatory Visit: Payer: Self-pay | Admitting: *Deleted

## 2019-08-28 DIAGNOSIS — C9000 Multiple myeloma not having achieved remission: Secondary | ICD-10-CM | POA: Diagnosis not present

## 2019-08-28 DIAGNOSIS — D509 Iron deficiency anemia, unspecified: Secondary | ICD-10-CM | POA: Diagnosis not present

## 2019-08-28 DIAGNOSIS — C9001 Multiple myeloma in remission: Secondary | ICD-10-CM

## 2019-08-28 DIAGNOSIS — R232 Flushing: Secondary | ICD-10-CM | POA: Diagnosis not present

## 2019-08-28 DIAGNOSIS — C541 Malignant neoplasm of endometrium: Secondary | ICD-10-CM | POA: Diagnosis not present

## 2019-08-28 DIAGNOSIS — Z5111 Encounter for antineoplastic chemotherapy: Secondary | ICD-10-CM | POA: Diagnosis not present

## 2019-08-28 DIAGNOSIS — R5383 Other fatigue: Secondary | ICD-10-CM | POA: Diagnosis not present

## 2019-08-28 LAB — CBC WITH DIFFERENTIAL (CANCER CENTER ONLY)
Abs Immature Granulocytes: 0.01 10*3/uL (ref 0.00–0.07)
Basophils Absolute: 0.1 10*3/uL (ref 0.0–0.1)
Basophils Relative: 2 %
Eosinophils Absolute: 0.1 10*3/uL (ref 0.0–0.5)
Eosinophils Relative: 2 %
HCT: 33.2 % — ABNORMAL LOW (ref 36.0–46.0)
Hemoglobin: 10.9 g/dL — ABNORMAL LOW (ref 12.0–15.0)
Immature Granulocytes: 0 %
Lymphocytes Relative: 16 %
Lymphs Abs: 0.8 10*3/uL (ref 0.7–4.0)
MCH: 28.1 pg (ref 26.0–34.0)
MCHC: 32.8 g/dL (ref 30.0–36.0)
MCV: 85.6 fL (ref 80.0–100.0)
Monocytes Absolute: 0.5 10*3/uL (ref 0.1–1.0)
Monocytes Relative: 10 %
Neutro Abs: 3.3 10*3/uL (ref 1.7–7.7)
Neutrophils Relative %: 70 %
Platelet Count: 223 10*3/uL (ref 150–400)
RBC: 3.88 MIL/uL (ref 3.87–5.11)
RDW: 16.1 % — ABNORMAL HIGH (ref 11.5–15.5)
WBC Count: 4.8 10*3/uL (ref 4.0–10.5)
nRBC: 0 % (ref 0.0–0.2)

## 2019-08-28 LAB — COMPREHENSIVE METABOLIC PANEL
ALT: 13 U/L (ref 0–44)
AST: 12 U/L — ABNORMAL LOW (ref 15–41)
Albumin: 4.6 g/dL (ref 3.5–5.0)
Alkaline Phosphatase: 37 U/L — ABNORMAL LOW (ref 38–126)
Anion gap: 8 (ref 5–15)
BUN: 13 mg/dL (ref 8–23)
CO2: 26 mmol/L (ref 22–32)
Calcium: 9.5 mg/dL (ref 8.9–10.3)
Chloride: 108 mmol/L (ref 98–111)
Creatinine, Ser: 0.84 mg/dL (ref 0.44–1.00)
GFR calc Af Amer: >60 mL/min (ref >60)
GFR calc non Af Amer: >60 mL/min (ref >60)
Glucose, Bld: 105 mg/dL — ABNORMAL HIGH (ref 70–99)
Potassium: 3.4 mmol/L — ABNORMAL LOW (ref 3.5–5.1)
Sodium: 142 mmol/L (ref 135–145)
Total Bilirubin: 0.3 mg/dL (ref 0.3–1.2)
Total Protein: 6.3 g/dL — ABNORMAL LOW (ref 6.5–8.1)

## 2019-08-28 MED ORDER — SODIUM CHLORIDE 0.9 % IV SOLN
Freq: Once | INTRAVENOUS | Status: AC
  Filled 2019-08-28: qty 250

## 2019-08-28 MED ORDER — SODIUM CHLORIDE 0.9% FLUSH
10.0000 mL | INTRAVENOUS | Status: DC | PRN
  Administered 2019-08-28: 10 mL
  Filled 2019-08-28: qty 10

## 2019-08-28 MED ORDER — PALONOSETRON HCL INJECTION 0.25 MG/5ML
INTRAVENOUS | Status: AC
  Filled 2019-08-28: qty 5

## 2019-08-28 MED ORDER — SODIUM CHLORIDE 0.9 % IV SOLN
40.0000 mg | Freq: Once | INTRAVENOUS | Status: AC
  Administered 2019-08-28: 40 mg via INTRAVENOUS
  Filled 2019-08-28: qty 4

## 2019-08-28 MED ORDER — ONDANSETRON HCL 8 MG PO TABS: 8.0000 mg | ORAL_TABLET | Freq: Two times a day (BID) | ORAL | 1 refills | Status: DC | PRN

## 2019-08-28 MED ORDER — SODIUM CHLORIDE 0.9 % IV SOLN
40.0000 mg | Freq: Once | INTRAVENOUS | Status: AC
  Administered 2019-08-28: 40 mg via INTRAVENOUS
  Filled 2019-08-28: qty 80

## 2019-08-28 MED ORDER — HEPARIN SOD (PORK) LOCK FLUSH 100 UNIT/ML IV SOLN
500.0000 [IU] | Freq: Once | INTRAVENOUS | Status: AC | PRN
  Administered 2019-08-28: 500 [IU]
  Filled 2019-08-28: qty 5

## 2019-08-28 MED ORDER — PALONOSETRON HCL INJECTION 0.25 MG/5ML
0.2500 mg | Freq: Once | INTRAVENOUS | Status: AC
  Administered 2019-08-28: 0.25 mg via INTRAVENOUS

## 2019-08-28 MED ORDER — PROCHLORPERAZINE MALEATE 10 MG PO TABS: 10.0000 mg | ORAL_TABLET | Freq: Four times a day (QID) | ORAL | 1 refills | Status: DC | PRN

## 2019-08-28 NOTE — Patient Instructions (Signed)
[facility] Discharge Instructions for Patients Receiving Chemotherapy  Today you received the following chemotherapy agents Pepaxto.  Continue taking potassium tablets and eat potassium rich foods.  To help prevent nausea and vomiting after your treatment, we encourage you to take your nausea medication as directed BUT NO ZOFRAN FOR 3 DAYS AFTER CHEMO. Can take Compazine first and then Ativan as second or third line of preventing nausea/vomiting.   If you develop nausea and vomiting that is not controlled by your nausea medication, call the clinic.   BELOW ARE SYMPTOMS THAT SHOULD BE REPORTED IMMEDIATELY:  *FEVER GREATER THAN 100.5 F  *CHILLS WITH OR WITHOUT FEVER  NAUSEA AND VOMITING THAT IS NOT CONTROLLED WITH YOUR NAUSEA MEDICATION  *UNUSUAL SHORTNESS OF BREATH  *UNUSUAL BRUISING OR BLEEDING  TENDERNESS IN MOUTH AND THROAT WITH OR WITHOUT PRESENCE OF ULCERS  *URINARY PROBLEMS  *BOWEL PROBLEMS  UNUSUAL RASH Items with * indicate a potential emergency and should be followed up as soon as possible.  Feel free to call the clinic you have any questions or concerns. The clinic phone number is [phone].  Please show the [place] at check-in to the Emergency Department and triage nurse.  Melphalan injection What is this medicine? MELPHALAN (MEL fa lan) is a chemotherapy drug. This medicine is used to treat multiple myeloma. It is also used prior to a stem cell transplant in patients with multiple myeloma. This medicine may be used for other purposes; ask your health care provider or pharmacist if you have questions. COMMON BRAND NAME(S): Alkeran, Evomela What should I tell my health care provider before I take this medicine? They need to know if you have any of these conditions:  infection  kidney disease  liver disease  low blood counts, like low white cell, platelet, or red cell counts  prior chemotherapy or radiation therapy  an unusual  or allergic reaction to melphalan, other chemotherapy, other medicines, foods, dyes, or preservatives  pregnant or trying to get pregnant  breast-feeding How should I use this medicine? This drug is is for infusion into a vein. It is administered in a hospital or clinic by a specially trained health care professional. Talk to your pediatrician regarding the use of this medicine in children. Special care may be needed. Overdosage: If you think you have taken too much of this medicine contact a poison control center or emergency room at once. NOTE: This medicine is only for you. Do not share this medicine with others. What if I miss a dose? It is important not to miss your dose. Call your doctor or health care professional if you are unable to keep an appointment. What may interact with this medicine? Do not take this medicine with the following medication:  nalidixic acid This medicine may also interact with the following medications:  carmustine  cisplatin  cyclosporine  live vaccines This list may not describe all possible interactions. Give your health care provider a list of all the medicines, herbs, non-prescription drugs, or dietary supplements you use. Also tell them if you smoke, drink alcohol, or use illegal drugs. Some items may interact with your medicine. What should I watch for while using this medicine? This drug may make you feel generally unwell. This is not uncommon, as chemotherapy can affect healthy cells as well as cancer cells. Report any side effects. Continue your course of treatment even though you feel ill unless your doctor tells you to stop. In some cases, you may be  given additional medicines to help with side effects. Follow all directions for their use. Call your doctor or health care professional for advice if you get a fever, chills or sore throat, or other symptoms of a cold or flu. Do not treat yourself. This drug decreases your body's ability to fight  infections. Try to avoid being around people who are sick. This medicine may increase your risk to bruise or bleed. Call your doctor or health care professional if you notice any unusual bleeding. You may need blood work done while you are taking this medicine. Talk to your doctor about your risk of cancer. You may be more at risk for certain types of cancers if you take this medicine. Do not become pregnant while taking this medicine or for a while after stopping it. Women should inform their doctor if they wish to become pregnant or think they might be pregnant. Men should not father a child while taking this medicine and for a while after stopping it. There is a potential for serious side effects to an unborn child. Talk to your health care professional or pharmacist for more information. Do not breast-feed an infant while taking this medicine. This medicine has caused ovarian failure in some women. This medicine may interfere with the ability to have a child. This medicine has caused reduced sperm counts in some men. This may interfere with the ability to father a child. You should talk with your doctor or health care professional if you are concerned about your fertility. What side effects may I notice from receiving this medicine? Side effects that you should report to your doctor or health care professional as soon as possible:  allergic reactions like skin rash, itching or hives, swelling of the face, lips, or tongue  breathing problems  blurred vision  diarrhea  dizziness  fast heartbeat  low blood counts - this medicine may decrease the number of white blood cells, red blood cells and platelets. You may be at increased risk for infections and bleeding.  missed menstrual periods  mouth sores  nausea, vomiting  signs of infection - fever or chills, cough, sore throat, pain or difficulty passing urine  signs of decreased platelets or bleeding - bruising, pinpoint red spots on  the skin, black, tarry stools, blood in the urine  signs of decreased red blood cells - unusually weak or tired, fainting spells, lightheadedness  signs and symptoms of liver injury like dark yellow or brown urine; general ill feeling or flu-like symptoms; light-colored stools; loss of appetite; nausea; right upper belly pain; unusually weak or tired; yellowing of the eyes or skin  weight loss Side effects that usually do not require medical attention (report to your doctor or health care professional if they continue or are bothersome):  constipation  hair loss  tiredness This list may not describe all possible side effects. Call your doctor for medical advice about side effects. You may report side effects to FDA at 1-800-FDA-1088. Where should I keep my medicine? This drug is given in a hospital or clinic and will not be stored at home. NOTE: This sheet is a summary. It may not cover all possible information. If you have questions about this medicine, talk to your doctor, pharmacist, or health care provider.  2020 Elsevier/Gold Standard (2014-06-23 11:06:09)

## 2019-08-29 ENCOUNTER — Telehealth: Payer: Self-pay

## 2019-08-29 NOTE — Telephone Encounter (Signed)
Left a VM for patient regarding chemotherapy follow up call.  Advised patient to call back if she had time.

## 2019-09-11 ENCOUNTER — Telehealth: Payer: Self-pay | Admitting: Hematology & Oncology

## 2019-09-11 NOTE — Telephone Encounter (Signed)
I called and spoke with patient regarding appointments that have bene moved out from 8/19 .  New date is 8/26 and patient voived understanding of the new appt date/time per 8/12 secure chat

## 2019-09-16 ENCOUNTER — Telehealth: Payer: Self-pay

## 2019-09-16 NOTE — Telephone Encounter (Cosign Needed)
Chronic Care Management   Outreach Note  09/16/2019 Name: Emily Greer MRN: [id number] DOB: 01-23-1951  Referred by: [name], MD Reason for referral : No chief complaint on file.   An unsuccessful telephone outreach was attempted today. The patient was referred to the case management team for assistance with care management and care coordination. [name] is driving and would prefer a call back at a later time. Call rescheduled.   Follow Up Plan: Telephone follow up appointment with care management team member scheduled for: 09/17/19  Delsa Sale, RN, BSN, CCM Care Management Coordinator [facility] Care Management/[facility]  Direct Phone: [phone]

## 2019-09-17 ENCOUNTER — Telehealth: Payer: Medicare Other

## 2019-09-18 ENCOUNTER — Inpatient Hospital Stay: Payer: Medicare Other

## 2019-09-18 ENCOUNTER — Inpatient Hospital Stay: Payer: Medicare Other | Admitting: Family

## 2019-09-22 ENCOUNTER — Other Ambulatory Visit: Payer: Medicare Other

## 2019-09-22 ENCOUNTER — Ambulatory Visit: Payer: Medicare Other | Admitting: Hematology & Oncology

## 2019-09-24 ENCOUNTER — Ambulatory Visit: Payer: Medicare Other | Admitting: Internal Medicine

## 2019-09-24 ENCOUNTER — Ambulatory Visit (INDEPENDENT_AMBULATORY_CARE_PROVIDER_SITE_OTHER): Payer: Medicare Other

## 2019-09-24 ENCOUNTER — Encounter: Payer: Self-pay | Admitting: Internal Medicine

## 2019-09-24 ENCOUNTER — Ambulatory Visit (INDEPENDENT_AMBULATORY_CARE_PROVIDER_SITE_OTHER): Payer: Medicare Other | Admitting: Internal Medicine

## 2019-09-24 ENCOUNTER — Other Ambulatory Visit: Payer: Self-pay

## 2019-09-24 VITALS — BP 132/70 | HR 78 | Temp 98.1°F | Ht 62.6 in | Wt 189.0 lb

## 2019-09-24 VITALS — BP 132/70 | HR 78 | Temp 98.1°F | Ht 62.6 in | Wt 189.2 lb

## 2019-09-24 DIAGNOSIS — I10 Essential (primary) hypertension: Secondary | ICD-10-CM

## 2019-09-24 DIAGNOSIS — Z79899 Other long term (current) drug therapy: Secondary | ICD-10-CM | POA: Diagnosis not present

## 2019-09-24 DIAGNOSIS — Z6833 Body mass index (BMI) 33.0-33.9, adult: Secondary | ICD-10-CM

## 2019-09-24 DIAGNOSIS — R7309 Other abnormal glucose: Secondary | ICD-10-CM | POA: Diagnosis not present

## 2019-09-24 DIAGNOSIS — Z1159 Encounter for screening for other viral diseases: Secondary | ICD-10-CM | POA: Diagnosis not present

## 2019-09-24 DIAGNOSIS — Z1231 Encounter for screening mammogram for malignant neoplasm of breast: Secondary | ICD-10-CM | POA: Diagnosis not present

## 2019-09-24 DIAGNOSIS — Z8542 Personal history of malignant neoplasm of other parts of uterus: Secondary | ICD-10-CM

## 2019-09-24 DIAGNOSIS — E6609 Other obesity due to excess calories: Secondary | ICD-10-CM | POA: Diagnosis not present

## 2019-09-24 DIAGNOSIS — R413 Other amnesia: Secondary | ICD-10-CM | POA: Diagnosis not present

## 2019-09-24 DIAGNOSIS — Z0000 Encounter for general adult medical examination without abnormal findings: Secondary | ICD-10-CM | POA: Diagnosis not present

## 2019-09-24 NOTE — Progress Notes (Signed)
This visit occurred during the SARS-CoV-2 public health emergency.  Safety protocols were in place, including screening questions prior to the visit, additional usage of staff PPE, and extensive cleaning of exam room while observing appropriate contact time as indicated for disinfecting solutions.  Subjective:   Emily Greer is a 69 y.o. female who presents for Medicare Annual (Subsequent) preventive examination.  Review of Systems     Cardiac Risk Factors include: advanced age (>61men, >47 women);hypertension;sedentary lifestyle     Objective:    Today's Vitals   09/24/19 1159  BP: 132/70  Pulse: 78  Temp: 98.1 F (36.7 C)  TempSrc: Oral  Weight: 189 lb (85.7 kg)  Height: 5' 2.6" (1.59 m)   Body mass index is 33.91 kg/m.  Advanced Directives 09/24/2019 08/21/2019 07/24/2019 06/26/2019 05/30/2019 04/24/2019 04/03/2019  Does Patient Have a Medical Advance Directive? No No No No No No No  Would patient like information on creating a medical advance directive? No - Patient declined No - Patient declined No - Patient declined No - Patient declined No - Patient declined No - Patient declined No - Patient declined  Pre-existing out of facility DNR order (yellow form or pink MOST form) - - - - - - -    Current Medications (verified) Outpatient Encounter Medications as of 09/24/2019  Medication Sig  . albuterol (VENTOLIN HFA) 108 (90 Base) MCG/ACT inhaler Inhale 2 puffs into the lungs every 6 (six) hours as needed for wheezing or shortness of breath. 2 puffs 3 times daily x 5 days then every 6 hours as needed.  [name] Kitchen amLODipine (NORVASC) 10 MG tablet Take 1 tablet (10 mg total) by mouth every morning.  [name] Kitchen aspirin EC 81 MG tablet Take 81 mg by mouth 2 (two) times daily. Stopped  By dr Marin Olp until urology procedures complete  . Calcium-Magnesium-Vitamin D (CALCIUM 1200+D3 PO) Take 1 tablet by mouth daily.  . Cholecalciferol (VITAMIN D3) 2000 UNITS TABS Take 2 tablets by mouth daily.   .  daratumumab (DARZALEX) 400 MG/20ML Inject into the vein.  . hydrochlorothiazide (MICROZIDE) 12.5 MG capsule Take 12.5 mg by mouth every morning.  [name] Kitchen letrozole (FEMARA) 2.5 MG tablet Take 1 tablet (2.5 mg total) by mouth daily.  [name] Kitchen loperamide (IMODIUM) 2 MG capsule Take by mouth as needed for diarrhea or loose stools. Reported on 08/19/2015  . loratadine (CLARITIN) 10 MG tablet Take 10 mg by mouth every morning. Reported on 08/19/2015  . metoprolol succinate (TOPROL-XL) 50 MG 24 hr tablet Take 1 tablet (50 mg total) by mouth every morning.  . montelukast (SINGULAIR) 10 MG tablet TAKE 1 TABLET(10 MG) BY MOUTH AT BEDTIME  . ondansetron (ZOFRAN) 8 MG tablet Take 1 tablet (8 mg total) by mouth 2 (two) times daily as needed (Nausea or vomiting).  . potassium chloride SA (KLOR-CON) 20 MEQ tablet TAKE 1 TABLET(20 MEQ) BY MOUTH TWICE DAILY  . Probiotic Product (PROBIOTIC DAILY PO) Take by mouth daily.  . prochlorperazine (COMPAZINE) 10 MG tablet Take 1 tablet (10 mg total) by mouth every 6 (six) hours as needed (Nausea or vomiting).  . triamcinolone (NASACORT ALLERGY 24HR) 55 MCG/ACT AERO nasal inhaler Place 2 sprays into the nose as needed.   . triamterene-hydrochlorothiazide (MAXZIDE-25) 37.5-25 MG tablet 1 tablet by mouth at bedtime   Facility-Administered Encounter Medications as of 09/24/2019  Medication  . sodium chloride flush (NS) 0.9 % injection 10 mL  . sodium chloride flush (NS) 0.9 % injection 10 mL  . sodium chloride flush (  NS) 0.9 % injection 10 mL    Allergies (verified) Codeine   History: Past Medical History:  Diagnosis Date  . Avascular necrosis of femoral head ([place])    bilateral per CT 07-26-2015  . Endometrial carcinoma [facility]) gyn oncologist-  dr Skeet Latch ([facility])/  radiation oncologist-- dr Sondra Come   2013 dx  FIGO Stage 1A, Grade 2 endometrioid endometrial cancer s/p TAH w/ BSO and bilateral pelvic node dissection 10-31-2011 ;  recurrence at distal vagina 04/ 2014 s/p   brachytherapy (ended 07-29-2012);  2nd recurrence 12/ 2016  vaginal apex s/p  conformational radiotherapy 03-10-2015 to 04-20-2015  . Family history of adverse reaction to anesthesia    mother ponv  . GERD (gastroesophageal reflux disease)   . Goals of care, counseling/discussion 08/21/2019  . H/O stem cell transplant ([place])    02/ 2000 and second one 06/ 2016  . 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 History of chemotherapy    last chemo 12-26-17  . History of radiation therapy 6/4, 6/11, 6/19, 6/25, 07/29/2012   vagina 30.5 gray in 5 fx, HDR brachytherapy:   last radiation to vagina 03-10-2015 to 04-20-2015  50.4gray  . History of radiation therapy 03/10/15-04/20/15   vagina 50.4 in 28 fractions  . Hypertension   . Lambda light chain myeloma [facility]) oncologist-  dr Marin Olp ([facility])  and  [facility] -- dr Salena Saner gasparetto   dx 07/ 1999 s/p  VAD chemotherapy 11/ 1999,  purged autotransplant 03-21-1998 followed by auto stem cell transplant 03-29-1998;  recurrance w/ second autologous stem cell transplant 07-24-2014;  in Re-mission currently , chemo maintenance therapy  . Osteoporosis 12/18/05   Increased  risk   . PONV (postoperative nausea and vomiting)   . Wears glasses    Past Surgical History:  Procedure Laterality Date  . ABDOMINAL HYSTERECTOMY  10/31/2011   Procedure: HYSTERECTOMY ABDOMINAL;  Surgeon: [name], MD PHD;  Location: [facility];  Service: Gynecology;  Laterality: N/A;  . CO2 LASER APPLICATION N/A 08/05/2374   Procedure: CO2 LASER APPLICATION;  Surgeon: [name], MD;  Location: [facility];  Service: Gynecology;  Laterality: N/A;  . CYSTOSCOPY WITH RETROGRADE PYELOGRAM, URETEROSCOPY AND STENT PLACEMENT Left 02/04/2018   Procedure: CYSTOSCOPY WITH RETROGRADE PYELOGRAM, URETEROSCOPY , bladder biopsy and fulgeration;  Surgeon: Cleon Gustin, MD;  Location[facility];  Service: Urology;   Laterality: Left;  . [place]  . HYSTEROSCOPY WITH D & C  09/27/2011   Procedure: DILATATION AND CURETTAGE /HYSTEROSCOPY;  Surgeon: Eldred Manges, MD;  Location: [facility];  Service: Gynecology;;  . IR FLUORO GUIDE PORT INSERTION RIGHT  09/08/2016  . IR US GUIDE VASC ACCESS RIGHT  09/08/2016  . LAPAROTOMY  10/31/2011   Procedure: EXPLORATORY LAPAROTOMY;  Surgeon: [name], MD PHD;  Location: [facility];  Service: Gynecology;  Laterality: N/A;  . SALPINGOOPHORECTOMY  10/31/2011   Procedure: SALPINGO OOPHERECTOMY;  Surgeon: [name], MD PHD;  Location: [facility];  Service: Gynecology;  Laterality: Bilateral;   Lymph Nodes sampling  . TUBAL LIGATION  1986   Family History  Problem Relation Age of Onset  . Colon cancer Mother   . Hypertension Father   . Heart Problems Father    Social History   Socioeconomic History  . Marital status: Married    Spouse name: Not on file  . Number of children: Not on file  . Years  of education: Not on file  . Highest education level: Not on file  Occupational History  . Occupation: retired  Tobacco Use  . Smoking status: Never Smoker  . Smokeless tobacco: Never Used  Vaping Use  . Vaping Use: Never used  Substance and Sexual Activity  . Alcohol use: Not Currently    Alcohol/week: 0.0 standard drinks    Comment:  occasional wine  . Drug use: Never  . Sexual activity: Yes  Other Topics Concern  . Not on file  Social History Narrative  . Not on file   Social Determinants of Health   Financial Resource Strain: Low Risk   . Difficulty of Paying Living Expenses: Not hard at all  Food Insecurity: No Food Insecurity  . Worried About Charity fundraiser in the Last Year: Never true  . Ran Out of Food in the Last Year: Never true  Transportation Needs: No Transportation Needs  . Lack of Transportation (Medical): No  . Lack of Transportation (Non-Medical): No  Physical Activity: Insufficiently Active  . Days of Exercise  per Week: 2 days  . Minutes of Exercise per Session: 20 min  Stress: No Stress Concern Present  . Feeling of Stress : Not at all  Social Connections:   . Frequency of Communication with Friends and Family: Not on file  . Frequency of Social Gatherings with Friends and Family: Not on file  . Attends Religious Services: Not on file  . Active Member of Clubs or Organizations: Not on file  . Attends Archivist Meetings: Not on file  . Marital Status: Not on file    Tobacco Counseling Counseling given: Not Answered   Clinical Intake:  Pre-visit preparation completed: Yes  Pain : No/denies pain     Nutritional Status: BMI > 30  Obese Nutritional Risks: None Diabetes: No  How often do you need to have someone help you when you read instructions, pamphlets, or other written materials from your doctor or pharmacy?: 1 - Never What is the last grade level you completed in school?: master's degree  Diabetic? no  Interpreter Needed?: No  Information entered by :: NAllen LPN   Activities of Daily Living In your present state of health, do you have any difficulty performing the following activities: 09/24/2019 09/23/2019  Hearing? N N  Vision? N N  Difficulty concentrating or making decisions? Y Y  Comment - remembering  Walking or climbing stairs? N N  Dressing or bathing? N N  Doing errands, shopping? N N  Preparing Food and eating ? N -  Using the Toilet? N -  In the past six months, have you accidently leaked urine? N -  Do you have problems with loss of bowel control? N -  Managing your Medications? N -  Managing your Finances? N -  Housekeeping or managing your Housekeeping? N -  Some recent data might be hidden    Patient Care Team: Glendale Chard, MD as PCP - General (Internal Medicine) Rex Kras, Claudette Stapler, RN as [place] any recent Medical Services you may have received from other than [facility] providers in the past  year (date may be approximate).     Assessment:   This is a routine wellness examination for Emily Greer.  Hearing/Vision screen  Hearing Screening   '125Hz'$  $[place]o'250Hz'vrION$'500Hz'$'1000Hz'$'2000Hz'$'3000Hz'$'4000Hz'$'6000Hz'$'8000Hz'$   Right ear:           Left ear:  Vision Screening Comments: Regular eye exams, Dr. Katy Fitch  Dietary issues and exercise activities discussed: Current Exercise Habits: Home exercise routine, Type of exercise: walking, Time (Minutes): 15, Frequency (Times/Week): 2, Weekly Exercise (Minutes/Week): 30  Goals    .  "My Cancer treatment is going well" (pt-stated)      Current Barriers:  [name] Kitchen Knowledge Deficits related to Multiple Myeloma . Chronic Disease Management support and education needs related to Multiple Myeloma; HTN , Class 1 Obesity   Nurse Case Manager Clinical Goal(s):  [name] Kitchen Over the next 90 days, patient will work with the CCM team to address needs related to disease management and support for Multiple Myeloma  CCM RN CM Interventions:  07/22/19 call completed with patient  . Evaluation of current treatment plan related to Multiple Myeloma and patient's adherence to plan as established by provider . Determined patient feels her Cancer treatment is going well with Dr. Marin Olp, Oncologist managing this illness with last OV completed on 06/26/19 and the following Assessment/Plan is noted:  o Principle Diagnosis:  o Recurrent lambda light chain myeloma o History of recurrent endometrial carcinoma o Iron deficiency anemia -blood loss o Past Therapy:             o Status post second autologous stem cell transplant on 07/24/2014 o Maintenance therapy with Pomalidomide/every 2 week Velcade - d/c'ed o Radiation therapy for endometrial recurrence - completed 04/20/2015 o Pomalyst/Kyprolis 70mg /m2 IV q 2 weeks - s/p cycle #12 - held on 12/26/2017 for hematuria o Current Therapy:        o Daratumumab/Pomalyst (1 mg) - status post cycle #18 o Femara 2.5 mg po q day  o Xgeva 120  mg subcu every 3 months-next dose in 07/2019 o IV iron as indicated o Noted Dr. Marin Olp recommends patient take 1 baby ASA qd and Toprol XL 50 mg 24 hr tablet qd  . Assessed for hydration and nutrition status; patient is able to tolerate a regular diet and feels she is staying well hydrated . Encouraged patient to balance her activity with rest and to notify her Oncology team of any status change promptly . Determined next Oncology f/u with Dr. Marin Olp is scheduled for 07/24/19 . Discussed plans with patient for ongoing care management follow up and provided patient with direct contact information for care management team  Patient Self Care Activities:  . Self administers medications as prescribed . Attends all scheduled provider appointments . Calls pharmacy for medication refills . Performs ADL's independently . Performs IADL's independently . Calls provider office for new concerns or questions  Please see past updates related to this goal by clicking on the "Past Updates" button in the selected goal      .  "to keep my BP under good control" (pt-stated)      Current Barriers:  [name] Kitchen Knowledge Deficits related to disease process and Self health management of Hypertension . Chronic Disease Management support and education needs related to HTN, Multiple Myeloma, Class 1 Obesity  Nurse Case Manager Clinical Goal(s):  [name] Kitchen Over the next 90 days, patient will work with the [place] CM and PCP to address needs related to Hypertension  CM RN CM Interventions:  07/22/19 call completed with patient  . Evaluation of current treatment plan related to Hypertension and patient's adherence to plan as established by provider . Determined Emily Greer has been under added stress since our last contact due to her spouse had a recent Stroke, it was mild and he is recovering .  Reinforced target BP <130/80; Reviewed and discussed Dr. Marin Olp added Toprol XL 50 mg, 24 hr tablet qd  . Determined patient continues  to have persistent edema noted to her RLE with ankle tenderness, she denies injury to this extremity, Dr. Marin Olp and PCP are aware . Determined next OV with Dr. Baird Cancer has been rescheduled to 07/30/19 $RemoveBef'@2'RJbBUjGrDj$ :15 PM  . Discussed plans with patient for ongoing care management follow up and provided patient with direct contact information for care management team  Patient Self Care Activities:  . Self administers medications as prescribed . Attends all scheduled provider appointments . Calls pharmacy for medication refills . Performs ADL's independently . Performs IADL's independently . Calls provider office for new concerns or questions  Please see past updates related to this goal by clicking on the "Past Updates" button in the selected goal      .  Patient Stated      09/24/2019, wants to lose enough weight to make her feel comfortable    .  Weight (lb) < 200 lb (90.7 kg)      09/18/2018, wants to get to normal BMI      Depression Screen PHQ 2/9 Scores 09/24/2019 09/23/2019 09/18/2018 03/19/2018 02/18/2018 12/03/2017 11/03/2017  PHQ - 2 Score 0 0 0 0 0 0 0  PHQ- 9 Score 0 - - - - - -    Fall Risk Fall Risk  09/24/2019 09/23/2019 09/18/2018 03/19/2018 02/18/2018  Falls in the past year? 0 0 0 0 0  Risk for fall due to : Medication side effect - Medication side effect - -  Follow up Falls evaluation completed;Education provided;Falls prevention discussed - - - -    Any stairs in or around the home? Yes  If so, are there any without handrails? No  Home free of loose throw rugs in walkways, pet beds, electrical cords, etc? Yes  Adequate lighting in your home to reduce risk of falls? Yes   ASSISTIVE DEVICES UTILIZED TO PREVENT FALLS:  Life alert? No  Use of a cane, walker or w/c? No  Grab bars in the bathroom? Yes  Shower chair or bench in shower? No  Elevated toilet seat or a handicapped toilet? Yes   TIMED UP AND GO:  Was the test performed? No .   Gait steady and fast without use of  assistive device  Cognitive Function:     6CIT Screen 09/24/2019 09/18/2018  What Year? 0 points 0 points  What month? 0 points 0 points  What time? 0 points 0 points  Count back from 20 0 points 0 points  Months in reverse 0 points 0 points  Repeat phrase 4 points 0 points  Total Score 4 0    Immunizations Immunization History  Administered Date(s) Administered  . DTaP 08/25/2015, 10/28/2015  . Hepatitis B, adult 08/25/2015, 10/28/2015  . HiB (PRP-T) 08/25/2015, 10/28/2015  . IPV 08/25/2015, 10/28/2015  . Influenza Split 11/01/2011  . Influenza, High Dose Seasonal PF 11/12/2018  . Influenza,inj,Quad PF,6+ Mos 11/03/2015, 10/31/2017  . Influenza-Unspecified 10/30/2013, 10/28/2014  . PFIZER SARS-COV-2 Vaccination 02/19/2019, 03/12/2019  . Pneumococcal Conjugate-13 02/12/2015, 08/25/2015, 10/28/2015  . Pneumococcal Polysaccharide-23 11/05/2013  . Tdap 08/27/2015    TDAP status: Up to date Flu Vaccine status: Up to date Pneumococcal vaccine status: Up to date Covid-19 vaccine status: Information provided on how to obtain vaccines.   Qualifies for Shingles Vaccine? Yes   Zostavax completed No   Shingrix Completed?: No.    Education has been provided  regarding the importance of this vaccine. Patient has been advised to call insurance company to determine out of pocket expense if they have not yet received this vaccine. Advised may also receive vaccine at local pharmacy or Health Dept. Verbalized acceptance and understanding.  Screening Tests Health Maintenance  Topic Date Due  . Hepatitis C Screening  Never done  . MAMMOGRAM  08/04/2012  . PNA vac Low Risk Adult (2 of 2 - PPSV23) 11/06/2018  . INFLUENZA VACCINE  08/31/2019  . TETANUS/TDAP  08/26/2025  . COLONOSCOPY  08/11/2027  . DEXA SCAN  Completed  . COVID-19 Vaccine  Completed    Health Maintenance  Health Maintenance Due  Topic Date Due  . Hepatitis C Screening  Never done  . MAMMOGRAM  08/04/2012  . PNA vac  Low Risk Adult (2 of 2 - PPSV23) 11/06/2018  . INFLUENZA VACCINE  08/31/2019    Colorectal cancer screening: Completed 08/10/2017. Repeat every 10 years Mammogram status: Ordered today. Pt provided with contact info and advised to call to schedule appt.  Bone Density status: Completed 01/01/2019.   Lung Cancer Screening: (Low Dose CT Chest recommended if Age 67-80 years, 30 pack-year currently smoking OR have quit w/in 15years.) does not qualify.   Lung Cancer Screening Referral: no  Additional Screening:  Hepatitis C Screening: does qualify;   Vision Screening: Recommended annual ophthalmology exams for early detection of glaucoma and other disorders of the eye. Is the patient up to date with their annual eye exam?  Yes  Who is the provider or what is the name of the office in which the patient attends annual eye exams? Dr. Katy Fitch If pt is not established with a provider, would they like to be referred to a provider to establish care? No .   Dental Screening: Recommended annual dental exams for proper oral hygiene  Community Resource Referral / Chronic Care Management: CRR required this visit?  No   CCM required this visit?  No      Plan:     I have personally reviewed and noted the following in the patient's chart:   . Medical and social history . Use of alcohol, tobacco or illicit drugs  . Current medications and supplements . Functional ability and status . Nutritional status . Physical activity . Advanced directives . List of other physicians . Hospitalizations, surgeries, and ER visits in previous 12 months . Vitals . Screenings to include cognitive, depression, and falls . Referrals and appointments  In addition, I have reviewed and discussed with patient certain preventive protocols, quality metrics, and best practice recommendations. A written personalized care plan for preventive services as well as general preventive health recommendations were provided to  patient.     Kellie Simmering, LPN   3/50/0938   Nurse Notes:

## 2019-09-24 NOTE — Progress Notes (Signed)
I,Tianna Badgett,acting as a Neurosurgeon for Gwynneth Aliment, MD.,have documented all relevant documentation on the behalf of Gwynneth Aliment, MD,as directed by  Gwynneth Aliment, MD while in the presence of Gwynneth Aliment, MD.  This visit occurred during the SARS-CoV-2 public health emergency.  Safety protocols were in place, including screening questions prior to the visit, additional usage of staff PPE, and extensive cleaning of exam room while observing appropriate contact time as indicated for disinfecting solutions.  Subjective:     Patient ID: Emily Greer , female    DOB: 03-31-1950 , 69 y.o.   MRN: [id number]   Chief Complaint  Patient presents with  . Hypertension    HPI  She presents today for BP check. She reports compliance with meds.   Hypertension This is a chronic problem. The current episode started more than 1 year ago. The problem has been gradually improving since onset. The problem is controlled. Pertinent negatives include no blurred vision, chest pain, palpitations or shortness of breath.     Past Medical History:  Diagnosis Date  . Avascular necrosis of femoral head (HCC)    bilateral per CT 07-26-2015  . Endometrial carcinoma [facility]) gyn oncologist-  dr Nelly Rout ([facility])/  radiation oncologist-- dr Roselind Messier   2013 dx  FIGO Stage 1A, Grade 2 endometrioid endometrial cancer s/p TAH w/ BSO and bilateral pelvic node dissection 10-31-2011 ;  recurrence at distal vagina 04/ 2014 s/p  brachytherapy (ended 07-29-2012);  2nd recurrence 12/ 2016  vaginal apex s/p  conformational radiotherapy 03-10-2015 to 04-20-2015  . Family history of adverse reaction to anesthesia    mother ponv  . GERD (gastroesophageal reflux disease)   . Goals of care, counseling/discussion 08/21/2019  . H/O stem cell transplant (HCC)    02/ 2000 and second one 06/ 2016  . 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 History of chemotherapy    last  chemo 12-26-17  . History of radiation therapy 6/4, 6/11, 6/19, 6/25, 07/29/2012   vagina 30.5 gray in 5 fx, HDR brachytherapy:   last radiation to vagina 03-10-2015 to 04-20-2015  50.4gray  . History of radiation therapy 03/10/15-04/20/15   vagina 50.4 in 28 fractions  . Hypertension   . Lambda light chain myeloma [facility]) oncologist-  dr Myna Hidalgo ([facility])  and  [facility] -- dr Silvestre Mesi gasparetto   dx 07/ 1999 s/p  VAD chemotherapy 11/ 1999,  purged autotransplant 03-21-1998 followed by auto stem cell transplant 03-29-1998;  recurrance w/ second autologous stem cell transplant 07-24-2014;  in Re-mission currently , chemo maintenance therapy  . Osteoporosis 12/18/05   Increased  risk   . PONV (postoperative nausea and vomiting)   . Wears glasses      Family History  Problem Relation Age of Onset  . Colon cancer Mother   . Hypertension Father   . Heart Problems Father      Current Outpatient Medications:  .  albuterol (VENTOLIN HFA) 108 (90 Base) MCG/ACT inhaler, Inhale 2 puffs into the lungs every 6 (six) hours as needed for wheezing or shortness of breath. 2 puffs 3 times daily x 5 days then every 6 hours as needed., Disp: 18 g, Rfl: 11 .  amLODipine (NORVASC) 10 MG tablet, Take 1 tablet (10 mg total) by mouth every morning., Disp: 90 tablet, Rfl: 2 .  aspirin EC 81 MG tablet, Take 81 mg by mouth 2 (two) times daily. Stopped  By dr Myna Hidalgo until urology procedures complete, Disp: , Rfl:  .  Calcium-Magnesium-Vitamin D (CALCIUM 1200+D3 PO), Take 1 tablet by mouth daily., Disp: , Rfl:  .  Cholecalciferol (VITAMIN D3) 2000 UNITS TABS, Take 2 tablets by mouth daily. , Disp: , Rfl:  .  daratumumab (DARZALEX) 400 MG/20ML, Inject into the vein., Disp: , Rfl:  .  hydrochlorothiazide (MICROZIDE) 12.5 MG capsule, Take 12.5 mg by mouth every morning., Disp: , Rfl:  .  letrozole (FEMARA) 2.5 MG tablet, Take 1 tablet (2.5 mg total) by mouth daily., Disp: 60 tablet, Rfl: 4 .  loperamide (IMODIUM) 2  MG capsule, Take by mouth as needed for diarrhea or loose stools. Reported on 08/19/2015, Disp: , Rfl:  .  loratadine (CLARITIN) 10 MG tablet, Take 10 mg by mouth every morning. Reported on 08/19/2015, Disp: , Rfl:  .  metoprolol succinate (TOPROL-XL) 50 MG 24 hr tablet, Take 1 tablet (50 mg total) by mouth every morning., Disp: 90 tablet, Rfl: 3 .  montelukast (SINGULAIR) 10 MG tablet, TAKE 1 TABLET(10 MG) BY MOUTH AT BEDTIME, Disp: 90 tablet, Rfl: 2 .  ondansetron (ZOFRAN) 8 MG tablet, Take 1 tablet (8 mg total) by mouth 2 (two) times daily as needed (Nausea or vomiting)., Disp: 30 tablet, Rfl: 1 .  potassium chloride SA (KLOR-CON) 20 MEQ tablet, TAKE 1 TABLET(20 MEQ) BY MOUTH TWICE DAILY, Disp: 60 tablet, Rfl: 3 .  Probiotic Product (PROBIOTIC DAILY PO), Take by mouth daily., Disp: , Rfl:  .  prochlorperazine (COMPAZINE) 10 MG tablet, Take 1 tablet (10 mg total) by mouth every 6 (six) hours as needed (Nausea or vomiting)., Disp: 30 tablet, Rfl: 1 .  triamcinolone (NASACORT ALLERGY 24HR) 55 MCG/ACT AERO nasal inhaler, Place 2 sprays into the nose as needed. , Disp: , Rfl:  .  triamterene-hydrochlorothiazide (MAXZIDE-25) 37.5-25 MG tablet, 1 tablet by mouth at bedtime, Disp: 90 tablet, Rfl: 2 No current facility-administered medications for this visit.  Facility-Administered Medications Ordered in Other Visits:  .  0.9 %  sodium chloride infusion, , Intravenous, Once PRN, Ennever, Rose Phi, MD .  albuterol (PROVENTIL) (2.5 MG/3ML) 0.083% nebulizer solution 2.5 mg, 2.5 mg, Nebulization, Once PRN, Ennever, Rose Phi, MD .  sodium chloride flush (NS) 0.9 % injection 10 mL, 10 mL, Intravenous, PRN, [place], Sarah M, NP, 10 mL at 12/26/17 1130 .  sodium chloride flush (NS) 0.9 % injection 10 mL, 10 mL, Intravenous, PRN, Josph Macho, MD, 10 mL at 04/24/19 1305 .  sodium chloride flush (NS) 0.9 % injection 10 mL, 10 mL, Intravenous, PRN, Josph Macho, MD, 10 mL at 08/21/19 1233 .  sodium chloride  flush (NS) 0.9 % injection 10 mL, 10 mL, Intracatheter, PRN, [place], Sarah M, NP, 10 mL at 10/01/19 1053   Allergies  Allergen Reactions  . Codeine Nausea Only     Review of Systems  Constitutional: Negative.   Eyes: Negative for blurred vision.  Respiratory: Negative.  Negative for shortness of breath.   Cardiovascular: Negative.  Negative for chest pain and palpitations.  Gastrointestinal: Negative.   Neurological: Negative.        She c/o memory changes. Admits that she has a lot on her plate.      Today's Vitals   09/24/19 1147  BP: 132/70  Pulse: 78  Temp: 98.1 F (36.7 C)  TempSrc: Oral  Weight: 189 lb 3.2 oz (85.8 kg)  Height: 5' 2.6" (1.59 m)   Body mass index is 33.94 kg/m.  Objective:  Physical Exam Vitals and nursing note reviewed.  Constitutional:      Appearance: Normal appearance.  HENT:     Head: Normocephalic and atraumatic.  Cardiovascular:     Rate and Rhythm: Normal rate and regular rhythm.     Heart sounds: Normal heart sounds.  Pulmonary:     Effort: Pulmonary effort is normal.     Breath sounds: Normal breath sounds.  Skin:    General: Skin is warm.  Neurological:     General: No focal deficit present.     Mental Status: She is alert.  Psychiatric:        Mood and Affect: Mood normal.        Behavior: Behavior normal.         Assessment And Plan:     1. Essential (primary) hypertension Comments: Chronic, controlled. Will continue on current medication.  Encouraged to avoid adding salt to her foods. She will rto in 4-6 months for re-evaluation.  - Lipid panel; Future  2. Memory changes Comments: I will put in order for vitamin B12, RPR and TSH to be drawn by Hem clinic. - Vitamin B12; Future - TSH; Future - RPR; Future  3. Other abnormal glucose Comments: I will check hba1c with other orders. Encouraged to avoid sugary beverages and to incorporate more exercise into her daily routine.  - Hemoglobin A1c; Future  4. Class  1 obesity due to excess calories with serious comorbidity and body mass index (BMI) of 33.0 to 33.9 in adult Comments: She is enocuraged to strive for BMI less than 30 to decrease cardiac risk. Advised to aim for at least 150 minutes of exercise per week.   5. Drug therapy  6. History of endometrial cancer Comments: I will refer her to GYN for f/u. She is an established patient of [facility], but her provider has since retired.  - Ambulatory referral to Gynecology  7. Encounter for HCV screening test for low risk patient Comments: I will check HCV antibody.  - Hepatitis C antibody; Future She is encouraged to strive for BMI less than 30 to decrease cardiac risk. Advised to aim for at least 150 minutes of exercise per week. Wt Readings from Last 3 Encounters:  10/01/19 193 lb (87.5 kg)  09/24/19 189 lb (85.7 kg)  09/24/19 189 lb 3.2 oz (85.8 kg)       Patient was given opportunity to ask questions. Patient verbalized understanding of the plan and was able to repeat key elements of the plan. All questions were answered to their satisfaction.  Gwynneth Aliment, MD   I, Gwynneth Aliment, MD, have reviewed all documentation for this visit. The documentation on 10/06/19 for the exam, diagnosis, procedures, and orders are all accurate and complete.  THE PATIENT IS ENCOURAGED TO PRACTICE SOCIAL DISTANCING DUE TO THE COVID-19 PANDEMIC.

## 2019-09-24 NOTE — Patient Instructions (Signed)
Emily Greer , Thank you for taking time to come for your Medicare Wellness Visit. I appreciate your ongoing commitment to your health goals. Please review the following plan we discussed and let me know if I can assist you in the future.   Screening recommendations/referrals: Colonoscopy: completed 08/10/2017 Mammogram: ordered Bone Density: completed 01/01/2019 Recommended yearly ophthalmology/optometry visit for glaucoma screening and checkup Recommended yearly dental visit for hygiene and checkup  Vaccinations: Influenza vaccine: due Pneumococcal vaccine: completed 10/28/2015 Tdap vaccine: completed 08/27/2015 Shingles vaccine: discussed   Covid-19: 03/12/2019, 02/19/2019  Advanced directives: Advance directive discussed with you today. Even though you declined this today please call our office should you change your mind and we can give you the proper paperwork for you to fill out.   Conditions/risks identified: none  Next appointment: Follow up in one year for your annual wellness visit 09/29/2020 at 2:00   Preventive Care 65 Years and Older, Female Preventive care refers to lifestyle choices and visits with your health care provider that can promote health and wellness. What does preventive care include?  A yearly physical exam. This is also called an annual well check.  Dental exams once or twice a year.  Routine eye exams. Ask your health care provider how often you should have your eyes checked.  Personal lifestyle choices, including:  Daily care of your teeth and gums.  Regular physical activity.  Eating a healthy diet.  Avoiding tobacco and drug use.  Limiting alcohol use.  Practicing safe sex.  Taking low-dose aspirin every day.  Taking vitamin and mineral supplements as recommended by your health care provider. What happens during an annual well check? The services and screenings done by your health care provider during your annual well check will depend on  your age, overall health, lifestyle risk factors, and family history of disease. Counseling  Your health care provider may ask you questions about your:  Alcohol use.  Tobacco use.  Drug use.  Emotional well-being.  Home and relationship well-being.  Sexual activity.  Eating habits.  History of falls.  Memory and ability to understand (cognition).  Work and work Statistician.  Reproductive health. Screening  You may have the following tests or measurements:  Height, weight, and BMI.  Blood pressure.  Lipid and cholesterol levels. These may be checked every 5 years, or more frequently if you are over 65 years old.  Skin check.  Lung cancer screening. You may have this screening every year starting at age 48 if you have a 30-pack-year history of smoking and currently smoke or have quit within the past 15 years.  Fecal occult blood test (FOBT) of the stool. You may have this test every year starting at age 60.  Flexible sigmoidoscopy or colonoscopy. You may have a sigmoidoscopy every 5 years or a colonoscopy every 10 years starting at age 26.  Hepatitis C blood test.  Hepatitis B blood test.  Sexually transmitted disease (STD) testing.  Diabetes screening. This is done by checking your blood sugar (glucose) after you have not eaten for a while (fasting). You may have this done every 1-3 years.  Bone density scan. This is done to screen for osteoporosis. You may have this done starting at age 55.  Mammogram. This may be done every 1-2 years. Talk to your health care provider about how often you should have regular mammograms. Talk with your health care provider about your test results, treatment options, and if necessary, the need for more tests. Vaccines  Your health care provider may recommend certain vaccines, such as:  Influenza vaccine. This is recommended every year.  Tetanus, diphtheria, and acellular pertussis (Tdap, Td) vaccine. You may need a Td booster  every 10 years.  Zoster vaccine. You may need this after age 31.  Pneumococcal 13-valent conjugate (PCV13) vaccine. One dose is recommended after age 47.  Pneumococcal polysaccharide (PPSV23) vaccine. One dose is recommended after age 60. Talk to your health care provider about which screenings and vaccines you need and how often you need them. This information is not intended to replace advice given to you by your health care provider. Make sure you discuss any questions you have with your health care provider. Document Released: 02/12/2015 Document Revised: 10/06/2015 Document Reviewed: 11/17/2014 Elsevier Interactive Patient Education  2017 [place] Prevention in the Home Falls can cause injuries. They can happen to people of all ages. There are many things you can do to make your home safe and to help prevent falls. What can I do on the outside of my home?  Regularly fix the edges of walkways and driveways and fix any cracks.  Remove anything that might make you trip as you walk through a door, such as a raised step or threshold.  Trim any bushes or trees on the path to your home.  Use bright outdoor lighting.  Clear any walking paths of anything that might make someone trip, such as rocks or tools.  Regularly check to see if handrails are loose or broken. Make sure that both sides of any steps have handrails.  Any raised decks and porches should have guardrails on the edges.  Have any leaves, snow, or ice cleared regularly.  Use sand or salt on walking paths during winter.  Clean up any spills in your garage right away. This includes oil or grease spills. What can I do in the bathroom?  Use night lights.  Install grab bars by the toilet and in the tub and shower. Do not use towel bars as grab bars.  Use non-skid mats or decals in the tub or shower.  If you need to sit down in the shower, use a plastic, non-slip stool.  Keep the floor dry. Clean up any  water that spills on the floor as soon as it happens.  Remove soap buildup in the tub or shower regularly.  Attach bath mats securely with double-sided non-slip rug tape.  Do not have throw rugs and other things on the floor that can make you trip. What can I do in the bedroom?  Use night lights.  Make sure that you have a light by your bed that is easy to reach.  Do not use any sheets or blankets that are too big for your bed. They should not hang down onto the floor.  Have a firm chair that has side arms. You can use this for support while you get dressed.  Do not have throw rugs and other things on the floor that can make you trip. What can I do in the kitchen?  Clean up any spills right away.  Avoid walking on wet floors.  Keep items that you use a lot in easy-to-reach places.  If you need to reach something above you, use a strong step stool that has a grab bar.  Keep electrical cords out of the way.  Do not use floor polish or wax that makes floors slippery. If you must use wax, use non-skid floor wax.  Do  not have throw rugs and other things on the floor that can make you trip. What can I do with my stairs?  Do not leave any items on the stairs.  Make sure that there are handrails on both sides of the stairs and use them. Fix handrails that are broken or loose. Make sure that handrails are as long as the stairways.  Check any carpeting to make sure that it is firmly attached to the stairs. Fix any carpet that is loose or worn.  Avoid having throw rugs at the top or bottom of the stairs. If you do have throw rugs, attach them to the floor with carpet tape.  Make sure that you have a light switch at the top of the stairs and the bottom of the stairs. If you do not have them, ask someone to add them for you. What else can I do to help prevent falls?  Wear shoes that:  Do not have high heels.  Have rubber bottoms.  Are comfortable and fit you well.  Are closed  at the toe. Do not wear sandals.  If you use a stepladder:  Make sure that it is fully opened. Do not climb a closed stepladder.  Make sure that both sides of the stepladder are locked into place.  Ask someone to hold it for you, if possible.  Clearly mark and make sure that you can see:  Any grab bars or handrails.  First and last steps.  Where the edge of each step is.  Use tools that help you move around (mobility aids) if they are needed. These include:  Canes.  Walkers.  Scooters.  Crutches.  Turn on the lights when you go into a dark area. Replace any light bulbs as soon as they burn out.  Set up your furniture so you have a clear path. Avoid moving your furniture around.  If any of your floors are uneven, fix them.  If there are any pets around you, be aware of where they are.  Review your medicines with your doctor. Some medicines can make you feel dizzy. This can increase your chance of falling. Ask your doctor what other things that you can do to help prevent falls. This information is not intended to replace advice given to you by your health care provider. Make sure you discuss any questions you have with your health care provider. Document Released: 11/12/2008 Document Revised: 06/24/2015 Document Reviewed: 02/20/2014 Elsevier Interactive Patient Education  2017 [employer].

## 2019-09-24 NOTE — Patient Instructions (Addendum)
THINGS TO ADDRESS:  1., Get labs drawn at Dr. Antonieta Pert office 2, When does Dr. Johnette Abraham think you should receive COVID booster shot 3., Be sure to put four weeks in between COVID/flu vaccine 4., Use Lactaid when eating dairy products - cheese, milk (try A-2 milk) You can use almond or oat milk as an alternative 5. We will check memory labs today   Hypertension, Adult High blood pressure (hypertension) is when the force of blood pumping through the arteries is too strong. The arteries are the blood vessels that carry blood from the heart throughout the body. Hypertension forces the heart to work harder to pump blood and may cause arteries to become narrow or stiff. Untreated or uncontrolled hypertension can cause a heart attack, heart failure, a stroke, kidney disease, and other problems. A blood pressure reading consists of a higher number over a lower number. Ideally, your blood pressure should be below 120/80. The first ("top") number is called the systolic pressure. It is a measure of the pressure in your arteries as your heart beats. The second ("bottom") number is called the diastolic pressure. It is a measure of the pressure in your arteries as the heart relaxes. What are the causes? The exact cause of this condition is not known. There are some conditions that result in or are related to high blood pressure. What increases the risk? Some risk factors for high blood pressure are under your control. The following factors may make you more likely to develop this condition:  Smoking.  Having type 2 diabetes mellitus, high cholesterol, or both.  Not getting enough exercise or physical activity.  Being overweight.  Having too much fat, sugar, calories, or salt (sodium) in your diet.  Drinking too much alcohol. Some risk factors for high blood pressure may be difficult or impossible to change. Some of these factors include:  Having chronic kidney disease.  Having a family history of high  blood pressure.  Age. Risk increases with age.  Race. You may be at higher risk if you are African American.  Gender. Men are at higher risk than women before age 14. After age 31, women are at higher risk than men.  Having obstructive sleep apnea.  Stress. What are the signs or symptoms? High blood pressure may not cause symptoms. Very high blood pressure (hypertensive crisis) may cause:  Headache.  Anxiety.  Shortness of breath.  Nosebleed.  Nausea and vomiting.  Vision changes.  Severe chest pain.  Seizures. How is this diagnosed? This condition is diagnosed by measuring your blood pressure while you are seated, with your arm resting on a flat surface, your legs uncrossed, and your feet flat on the floor. The cuff of the blood pressure monitor will be placed directly against the skin of your upper arm at the level of your heart. It should be measured at least twice using the same arm. Certain conditions can cause a difference in blood pressure between your right and left arms. Certain factors can cause blood pressure readings to be lower or higher than normal for a short period of time:  When your blood pressure is higher when you are in a health care provider's office than when you are at home, this is called white coat hypertension. Most people with this condition do not need medicines.  When your blood pressure is higher at home than when you are in a health care provider's office, this is called masked hypertension. Most people with this condition may need medicines  to control blood pressure. If you have a high blood pressure reading during one visit or you have normal blood pressure with other risk factors, you may be asked to:  Return on a different day to have your blood pressure checked again.  Monitor your blood pressure at home for 1 week or longer. If you are diagnosed with hypertension, you may have other blood or imaging tests to help your health care provider  understand your overall risk for other conditions. How is this treated? This condition is treated by making healthy lifestyle changes, such as eating healthy foods, exercising more, and reducing your alcohol intake. Your health care provider may prescribe medicine if lifestyle changes are not enough to get your blood pressure under control, and if:  Your systolic blood pressure is above 130.  Your diastolic blood pressure is above 80. Your personal target blood pressure may vary depending on your medical conditions, your age, and other factors. Follow these instructions at home: Eating and drinking   Eat a diet that is high in fiber and potassium, and low in sodium, added sugar, and fat. An example eating plan is called the DASH (Dietary Approaches to Stop Hypertension) diet. To eat this way: ? Eat plenty of fresh fruits and vegetables. Try to fill one half of your plate at each meal with fruits and vegetables. ? Eat whole grains, such as whole-wheat pasta, brown rice, or whole-grain bread. Fill about one fourth of your plate with whole grains. ? Eat or drink low-fat dairy products, such as skim milk or low-fat yogurt. ? Avoid fatty cuts of meat, processed or cured meats, and poultry with skin. Fill about one fourth of your plate with lean proteins, such as fish, chicken without skin, beans, eggs, or tofu. ? Avoid pre-made and processed foods. These tend to be higher in sodium, added sugar, and fat.  Reduce your daily sodium intake. Most people with hypertension should eat less than 1,500 mg of sodium a day.  Do not drink alcohol if: ? Your health care provider tells you not to drink. ? You are pregnant, may be pregnant, or are planning to become pregnant.  If you drink alcohol: ? Limit how much you use to:  0-1 drink a day for women.  0-2 drinks a day for men. ? Be aware of how much alcohol is in your drink. In the [place] one drink equals one 12 oz bottle of beer (355 mL), one 5 oz  glass of wine (148 mL), or one 1 oz glass of hard liquor (44 mL). Lifestyle   Work with your health care provider to maintain a healthy body weight or to lose weight. Ask what an ideal weight is for you.  Get at least 30 minutes of exercise most days of the week. Activities may include walking, swimming, or biking.  Include exercise to strengthen your muscles (resistance exercise), such as Pilates or lifting weights, as part of your weekly exercise routine. Try to do these types of exercises for 30 minutes at least 3 days a week.  Do not use any products that contain nicotine or tobacco, such as cigarettes, e-cigarettes, and chewing tobacco. If you need help quitting, ask your health care provider.  Monitor your blood pressure at home as told by your health care provider.  Keep all follow-up visits as told by your health care provider. This is important. Medicines  Take over-the-counter and prescription medicines only as told by your health care provider. Follow directions carefully. Blood  pressure medicines must be taken as prescribed.  Do not skip doses of blood pressure medicine. Doing this puts you at risk for problems and can make the medicine less effective.  Ask your health care provider about side effects or reactions to medicines that you should watch for. Contact a health care provider if you:  Think you are having a reaction to a medicine you are taking.  Have headaches that keep coming back (recurring).  Feel dizzy.  Have swelling in your ankles.  Have trouble with your vision. Get help right away if you:  Develop a severe headache or confusion.  Have unusual weakness or numbness.  Feel faint.  Have severe pain in your chest or abdomen.  Vomit repeatedly.  Have trouble breathing. Summary  Hypertension is when the force of blood pumping through your arteries is too strong. If this condition is not controlled, it may put you at risk for serious  complications.  Your personal target blood pressure may vary depending on your medical conditions, your age, and other factors. For most people, a normal blood pressure is less than 120/80.  Hypertension is treated with lifestyle changes, medicines, or a combination of both. Lifestyle changes include losing weight, eating a healthy, low-sodium diet, exercising more, and limiting alcohol. This information is not intended to replace advice given to you by your health care provider. Make sure you discuss any questions you have with your health care provider. Document Revised: 09/26/2017 Document Reviewed: 09/26/2017 Elsevier Patient Education  2020 [employer].

## 2019-09-25 ENCOUNTER — Inpatient Hospital Stay: Payer: Medicare Other

## 2019-09-25 ENCOUNTER — Inpatient Hospital Stay: Payer: Medicare Other | Admitting: Family

## 2019-09-29 ENCOUNTER — Other Ambulatory Visit: Payer: Self-pay

## 2019-09-29 ENCOUNTER — Telehealth: Payer: Medicare Other

## 2019-09-29 ENCOUNTER — Other Ambulatory Visit: Payer: Self-pay | Admitting: Family

## 2019-09-29 ENCOUNTER — Ambulatory Visit (INDEPENDENT_AMBULATORY_CARE_PROVIDER_SITE_OTHER): Payer: Medicare Other

## 2019-09-29 DIAGNOSIS — I10 Essential (primary) hypertension: Secondary | ICD-10-CM

## 2019-09-29 DIAGNOSIS — C9000 Multiple myeloma not having achieved remission: Secondary | ICD-10-CM

## 2019-09-29 DIAGNOSIS — E6609 Other obesity due to excess calories: Secondary | ICD-10-CM

## 2019-09-29 NOTE — Chronic Care Management (AMB) (Signed)
Chronic Care Management   Follow Up Note   09/29/2019 Name: Emily Greer MRN: [id number] DOB: [birth year]  Referred by: [name], MD Reason for referral : Chronic Care Management (FU RN CM Call )   Emily Greer is a 69 y.o. year old female who is a primary care patient of [name], MD. The CCM team was consulted for assistance with chronic disease management and care coordination needs.    Review of patient status, including review of consultants reports, relevant laboratory and other test results, and collaboration with appropriate care team members and the patient's provider was performed as part of comprehensive patient evaluation and provision of chronic care management services.    SDOH (Social Determinants of Health) assessments performed: No See Care Plan activities for detailed interventions related to SDOH)   Placed outbound CCM RN CM call to patient for a care plan follow up.     Outpatient Encounter Medications as of 09/29/2019  Medication Sig  . albuterol (VENTOLIN HFA) 108 (90 Base) MCG/ACT inhaler Inhale 2 puffs into the lungs every 6 (six) hours as needed for wheezing or shortness of breath. 2 puffs 3 times daily x 5 days then every 6 hours as needed.  [name] Kitchen amLODipine (NORVASC) 10 MG tablet Take 1 tablet (10 mg total) by mouth every morning.  [name] Kitchen aspirin EC 81 MG tablet Take 81 mg by mouth 2 (two) times daily. Stopped  By dr Myna Hidalgo until urology procedures complete  . Calcium-Magnesium-Vitamin D (CALCIUM 1200+D3 PO) Take 1 tablet by mouth daily.  . Cholecalciferol (VITAMIN D3) 2000 UNITS TABS Take 2 tablets by mouth daily.   . daratumumab (DARZALEX) 400 MG/20ML Inject into the vein.  . hydrochlorothiazide (MICROZIDE) 12.5 MG capsule Take 12.5 mg by mouth every morning.  [name] Kitchen letrozole (FEMARA) 2.5 MG tablet Take 1 tablet (2.5 mg total) by mouth daily.  [name] Kitchen loperamide (IMODIUM) 2 MG capsule Take by mouth as needed for diarrhea or loose stools. Reported on  08/19/2015  . loratadine (CLARITIN) 10 MG tablet Take 10 mg by mouth every morning. Reported on 08/19/2015  . metoprolol succinate (TOPROL-XL) 50 MG 24 hr tablet Take 1 tablet (50 mg total) by mouth every morning.  . montelukast (SINGULAIR) 10 MG tablet TAKE 1 TABLET(10 MG) BY MOUTH AT BEDTIME  . ondansetron (ZOFRAN) 8 MG tablet Take 1 tablet (8 mg total) by mouth 2 (two) times daily as needed (Nausea or vomiting).  . potassium chloride SA (KLOR-CON) 20 MEQ tablet TAKE 1 TABLET(20 MEQ) BY MOUTH TWICE DAILY  . Probiotic Product (PROBIOTIC DAILY PO) Take by mouth daily.  . prochlorperazine (COMPAZINE) 10 MG tablet Take 1 tablet (10 mg total) by mouth every 6 (six) hours as needed (Nausea or vomiting).  . triamcinolone (NASACORT ALLERGY 24HR) 55 MCG/ACT AERO nasal inhaler Place 2 sprays into the nose as needed.   . triamterene-hydrochlorothiazide (MAXZIDE-25) 37.5-25 MG tablet 1 tablet by mouth at bedtime   Facility-Administered Encounter Medications as of 09/29/2019  Medication  . sodium chloride flush (NS) 0.9 % injection 10 mL  . sodium chloride flush (NS) 0.9 % injection 10 mL  . sodium chloride flush (NS) 0.9 % injection 10 mL     Objective:  Lab Results  Component Value Date   HGBA1C 5.7 (H) 06/01/2014   HGBA1C 5.9 03/22/2007   Lab Results  Component Value Date   LDLCALC 164 (H) 03/22/2007   CREATININE 0.84 08/28/2019   BP Readings from Last 3 Encounters:  09/24/19 132/70  09/24/19  132/70  08/28/19 (!) 154/80    Goals Addressed      Patient Stated   .  "My Cancer treatment is going well" (pt-stated)   On track     Current Barriers:  [name] Kitchen Knowledge Deficits related to Multiple Myeloma . Chronic Disease Management support and education needs related to Multiple Myeloma; HTN , Class 1 Obesity   Nurse Case Manager Clinical Goal(s):  [name] Kitchen Over the next 90 days, patient will work with the CCM team to address needs related to disease management and support for Multiple Myeloma  CCM  RN CM Interventions:  07/22/19 call completed with patient  . Evaluation of current treatment plan related to Multiple Myeloma and patient's adherence to plan as established by provider . Determined patient feels her Cancer treatment is going well with Dr. Myna Hidalgo, Oncologist managing this illness with last OV completed on 06/26/19 and the following Assessment/Plan is noted:  o Principle Diagnosis:  o Recurrent lambda light chain myeloma o History of recurrent endometrial carcinoma o Iron deficiency anemia -blood loss o Past Therapy:             o Status post second autologous stem cell transplant on 07/24/2014 o Maintenance therapy with Pomalidomide/every 2 week Velcade - d/c'ed o Radiation therapy for endometrial recurrence - completed 04/20/2015 o Pomalyst/Kyprolis 70mg /m2 IV q 2 weeks - s/p cycle #12 - held on 12/26/2017 for hematuria o Current Therapy:        o Daratumumab/Pomalyst (1 mg) - status post cycle #18 o Femara 2.5 mg po q day  o Xgeva 120 mg subcu every 3 months-next dose in 07/2019 o IV iron as indicated o Noted Dr. Myna Hidalgo recommends patient take 1 baby ASA qd and Toprol XL 50 mg 24 hr tablet qd  . Assessed for hydration and nutrition status; patient is able to tolerate a regular diet and feels she is staying well hydrated . Encouraged patient to balance her activity with rest and to notify her Oncology team of any status change promptly . Determined next Oncology f/u with Dr. Myna Hidalgo is scheduled for 07/24/19 . Discussed plans with patient for ongoing care management follow up and provided patient with direct contact information for care management team 09/29/19 Call completed with patient  . Determined patient will f/u with Dr. Myna Hidalgo on on 10/01/19 at which time she will have blood work and discuss her CA treatment plan . Discussed patient would prefer a call back after this appointment in order to discuss any changes made with her CA treatment plan per recommendations of  Dr. Myna Hidalgo, discussed reviewing her labs at next call . Discussed plans with patient for ongoing care management follow up and provided patient with direct contact information for care management team  Patient Self Care Activities:  . Self administers medications as prescribed . Attends all scheduled provider appointments . Calls pharmacy for medication refills . Performs ADL's independently . Performs IADL's independently . Calls provider office for new concerns or questions  Please see past updates related to this goal by clicking on the "Past Updates" button in the selected goal        Plan:   Telephone follow up appointment with care management team member scheduled for:  10/03/19  Delsa Sale, RN, BSN, CCM Care Management Coordinator [facility]  Direct Phone: [phone]

## 2019-09-29 NOTE — Patient Instructions (Signed)
Visit Information  Goals Addressed      Patient Stated   .  "My Cancer treatment is going well" (pt-stated)   On track     Current Barriers:  [name] Kitchen Knowledge Deficits related to Multiple Myeloma . Chronic Disease Management support and education needs related to Multiple Myeloma; HTN , Class 1 Obesity   Nurse Case Manager Clinical Goal(s):  [name] Kitchen Over the next 90 days, patient will work with the CCM team to address needs related to disease management and support for Multiple Myeloma  CCM RN CM Interventions:  07/22/19 call completed with patient  . Evaluation of current treatment plan related to Multiple Myeloma and patient's adherence to plan as established by provider . Determined patient feels her Cancer treatment is going well with Dr. Marin Olp, Oncologist managing this illness with last OV completed on 06/26/19 and the following Assessment/Plan is noted:  o Principle Diagnosis:  o Recurrent lambda light chain myeloma o History of recurrent endometrial carcinoma o Iron deficiency anemia -blood loss o Past Therapy:             o Status post second autologous stem cell transplant on 07/24/2014 o Maintenance therapy with Pomalidomide/every 2 week Velcade - d/c'ed o Radiation therapy for endometrial recurrence - completed 04/20/2015 o Pomalyst/Kyprolis 58m/m2 IV q 2 weeks - s/p cycle #12 - held on 12/26/2017 for hematuria o Current Therapy:        o Daratumumab/Pomalyst (1 mg) - status post cycle #18 o Femara 2.5 mg po q day  o Xgeva 120 mg subcu every 3 months-next dose in 07/2019 o IV iron as indicated o Noted Dr. EMarin Olprecommends patient take 1 baby ASA qd and Toprol XL 50 mg 24 hr tablet qd  . Assessed for hydration and nutrition status; patient is able to tolerate a regular diet and feels she is staying well hydrated . Encouraged patient to balance her activity with rest and to notify her Oncology team of any status change promptly . Determined next Oncology f/u with Dr. EMarin Olpis  scheduled for 07/24/19 . Discussed plans with patient for ongoing care management follow up and provided patient with direct contact information for care management team 09/29/19 Call completed with patient  . Determined patient will f/u with Dr. EMarin Olpon on 10/01/19 at which time she will have blood work and discuss her CA treatment plan . Discussed patient would prefer a call back after this appointment in order to discuss any changes made with her CA treatment plan per recommendations of Dr. EMarin Olp discussed reviewing her labs at next call . Discussed plans with patient for ongoing care management follow up and provided patient with direct contact information for care management team  Patient Self Care Activities:  . Self administers medications as prescribed . Attends all scheduled provider appointments . Calls pharmacy for medication refills . Performs ADL's independently . Performs IADL's independently . Calls provider office for new concerns or questions  Please see past updates related to this goal by clicking on the "Past Updates" button in the selected goal        Patient verbalizes understanding of instructions provided today.   Telephone follow up appointment with care management team member scheduled for: 10/03/19  ABarb Merino RN, BSN, CCM Care Management Coordinator [facility]  Direct Phone: [phone]

## 2019-10-01 ENCOUNTER — Inpatient Hospital Stay: Payer: Medicare Other

## 2019-10-01 ENCOUNTER — Encounter: Payer: Self-pay | Admitting: Family

## 2019-10-01 ENCOUNTER — Inpatient Hospital Stay (HOSPITAL_BASED_OUTPATIENT_CLINIC_OR_DEPARTMENT_OTHER): Payer: Medicare Other | Admitting: Family

## 2019-10-01 ENCOUNTER — Other Ambulatory Visit: Payer: Self-pay

## 2019-10-01 ENCOUNTER — Telehealth: Payer: Self-pay

## 2019-10-01 ENCOUNTER — Inpatient Hospital Stay: Payer: Medicare Other | Attending: Hematology & Oncology

## 2019-10-01 VITALS — BP 131/68 | HR 73 | Temp 97.6°F | Resp 16 | Ht 62.0 in | Wt 193.0 lb

## 2019-10-01 DIAGNOSIS — C9001 Multiple myeloma in remission: Secondary | ICD-10-CM

## 2019-10-01 DIAGNOSIS — C541 Malignant neoplasm of endometrium: Secondary | ICD-10-CM

## 2019-10-01 DIAGNOSIS — D500 Iron deficiency anemia secondary to blood loss (chronic): Secondary | ICD-10-CM | POA: Diagnosis not present

## 2019-10-01 DIAGNOSIS — C9000 Multiple myeloma not having achieved remission: Secondary | ICD-10-CM | POA: Diagnosis not present

## 2019-10-01 DIAGNOSIS — Z79811 Long term (current) use of aromatase inhibitors: Secondary | ICD-10-CM | POA: Diagnosis not present

## 2019-10-01 DIAGNOSIS — R319 Hematuria, unspecified: Secondary | ICD-10-CM | POA: Diagnosis not present

## 2019-10-01 DIAGNOSIS — Z79899 Other long term (current) drug therapy: Secondary | ICD-10-CM | POA: Diagnosis not present

## 2019-10-01 DIAGNOSIS — Z5111 Encounter for antineoplastic chemotherapy: Secondary | ICD-10-CM | POA: Insufficient documentation

## 2019-10-01 LAB — CBC WITH DIFFERENTIAL (CANCER CENTER ONLY)
Abs Immature Granulocytes: 0 10*3/uL (ref 0.00–0.07)
Basophils Absolute: 0 10*3/uL (ref 0.0–0.1)
Basophils Relative: 1 %
Eosinophils Absolute: 0.1 10*3/uL (ref 0.0–0.5)
Eosinophils Relative: 2 %
HCT: 31.2 % — ABNORMAL LOW (ref 36.0–46.0)
Hemoglobin: 10.3 g/dL — ABNORMAL LOW (ref 12.0–15.0)
Immature Granulocytes: 0 %
Lymphocytes Relative: 11 %
Lymphs Abs: 0.4 10*3/uL — ABNORMAL LOW (ref 0.7–4.0)
MCH: 28.1 pg (ref 26.0–34.0)
MCHC: 33 g/dL (ref 30.0–36.0)
MCV: 85 fL (ref 80.0–100.0)
Monocytes Absolute: 0.3 10*3/uL (ref 0.1–1.0)
Monocytes Relative: 10 %
Neutro Abs: 2.4 10*3/uL (ref 1.7–7.7)
Neutrophils Relative %: 76 %
Platelet Count: 66 10*3/uL — ABNORMAL LOW (ref 150–400)
RBC: 3.67 MIL/uL — ABNORMAL LOW (ref 3.87–5.11)
RDW: 16.3 % — ABNORMAL HIGH (ref 11.5–15.5)
WBC Count: 3.2 10*3/uL — ABNORMAL LOW (ref 4.0–10.5)
nRBC: 0 % (ref 0.0–0.2)

## 2019-10-01 LAB — CMP (CANCER CENTER ONLY)
ALT: 11 U/L (ref 0–44)
AST: 9 U/L — ABNORMAL LOW (ref 15–41)
Albumin: 4.3 g/dL (ref 3.5–5.0)
Alkaline Phosphatase: 33 U/L — ABNORMAL LOW (ref 38–126)
Anion gap: 7 (ref 5–15)
BUN: 19 mg/dL (ref 8–23)
CO2: 27 mmol/L (ref 22–32)
Calcium: 10.1 mg/dL (ref 8.9–10.3)
Chloride: 107 mmol/L (ref 98–111)
Creatinine: 0.86 mg/dL (ref 0.44–1.00)
GFR, Est AFR Am: >60 mL/min (ref >60)
GFR, Estimated: >60 mL/min (ref >60)
Glucose, Bld: 94 mg/dL (ref 70–99)
Potassium: 3.9 mmol/L (ref 3.5–5.1)
Sodium: 141 mmol/L (ref 135–145)
Total Bilirubin: 0.4 mg/dL (ref 0.3–1.2)
Total Protein: 6.1 g/dL — ABNORMAL LOW (ref 6.5–8.1)

## 2019-10-01 LAB — RETICULOCYTES
Immature Retic Fract: 17.5 % — ABNORMAL HIGH (ref 2.3–15.9)
RBC.: 3.66 MIL/uL — ABNORMAL LOW (ref 3.87–5.11)
Retic Count, Absolute: 67.7 10*3/uL (ref 19.0–186.0)
Retic Ct Pct: 1.9 % (ref 0.4–3.1)

## 2019-10-01 LAB — LACTATE DEHYDROGENASE: LDH: 152 U/L (ref 98–192)

## 2019-10-01 MED ORDER — DIPHENHYDRAMINE HCL 50 MG/ML IJ SOLN: 25.0000 mg | Freq: Once | INTRAMUSCULAR | Status: DC | PRN

## 2019-10-01 MED ORDER — EPINEPHRINE HCL 0.1 MG/ML IJ SOLN: 0.2500 mg | Freq: Once | INTRAMUSCULAR | Status: DC | PRN

## 2019-10-01 MED ORDER — DIPHENHYDRAMINE HCL 50 MG/ML IJ SOLN: 50.0000 mg | Freq: Once | INTRAMUSCULAR | Status: DC | PRN

## 2019-10-01 MED ORDER — METHYLPREDNISOLONE SODIUM SUCC 125 MG IJ SOLR: 125.0000 mg | Freq: Once | INTRAMUSCULAR | Status: DC | PRN

## 2019-10-01 MED ORDER — HEPARIN SOD (PORK) LOCK FLUSH 100 UNIT/ML IV SOLN
500.0000 [IU] | Freq: Once | INTRAVENOUS | Status: AC | PRN
  Administered 2019-10-01: 500 [IU]
  Filled 2019-10-01: qty 5

## 2019-10-01 MED ORDER — SODIUM CHLORIDE 0.9 % IV SOLN
Freq: Once | INTRAVENOUS | Status: AC | PRN
  Filled 2019-10-01: qty 250

## 2019-10-01 MED ORDER — SODIUM CHLORIDE 0.9% FLUSH
10.0000 mL | INTRAVENOUS | Status: DC | PRN
  Administered 2019-10-01: 10 mL
  Filled 2019-10-01: qty 10

## 2019-10-01 MED ORDER — ALBUTEROL SULFATE (2.5 MG/3ML) 0.083% IN NEBU
2.5000 mg | INHALATION_SOLUTION | Freq: Once | RESPIRATORY_TRACT | Status: AC | PRN
  Filled 2019-10-01: qty 3

## 2019-10-01 NOTE — Progress Notes (Signed)
Hematology and Oncology Follow Up Visit  TAMSYN TRISSEL [id number] 11-08-1950 69 y.o. 10/01/2019   Principle Diagnosis:  Recurrent lambda light chain myeloma - nl cytogenetics History of recurrent endometrial carcinoma Iron deficiency anemia-blood loss  PastTherapy: Status post second autologous stem cell transplant on 07/24/2014 Maintenance therapy with Pomalidomide/every 2 week Velcade - d/c'ed Radiation therapy for endometrial recurrence - completed 04/20/2015 Pomalyst/Kyprolis 70mg /m2 IV q 2 weeks - s/p cycle #12 - held on 12/26/2017 for hematuria Daratumumab/Pomalyst (1 mg) - status post cycle19 -- d/c on 08/21/2019  Current Therapy: Melflufen 40 mg IV q 4 weeks -- started on 08/27/2019, s/p cycle 1 Femara 2.5 mg po q day Xgeva 120 mg subcu every 3 months - next dose in 07/2019 IV iron as indicated   Interim History:  Ms. Herms is here today for follow-up and treatment. She did well with her first cycle of Melflufen.  In July her M-spike had increased to 0.2 g/dL and lambda light chains 18.45 mg/dL. IgG level was 299.  She denies fatigue and is resting well at night. She is still staying busy with her family. No fever, chills, n/v, cough, rash, dizziness, SOB, chest pain, palpitations, abdominal pain or changes in bowel or bladder habits.  No episodes of bleeding. No bruising or petechiae.  She has occasional mild puffiness around the right ankle. This comes and goes.  No swelling, tenderness, numbness or tingling in her extremities at this time.  No falls or syncope.  She has maintained a good appetite but admits that she needs to better hydrated throughout the day. Her weight is stable.   ECOG Performance Status: 1 - Symptomatic but completely ambulatory  Medications:  Allergies as of 10/01/2019      Reactions   Codeine Nausea Only      Medication List       Accurate as of October 01, 2019 10:12 AM. If you have any questions, ask your  nurse or doctor.        albuterol 108 (90 Base) MCG/ACT inhaler Commonly known as: VENTOLIN HFA Inhale 2 puffs into the lungs every 6 (six) hours as needed for wheezing or shortness of breath. 2 puffs 3 times daily x 5 days then every 6 hours as needed.   amLODipine 10 MG tablet Commonly known as: NORVASC Take 1 tablet (10 mg total) by mouth every morning.   aspirin EC 81 MG tablet Take 81 mg by mouth 2 (two) times daily. Stopped  By dr Myna Hidalgo until urology procedures complete   CALCIUM 1200+D3 PO Take 1 tablet by mouth daily.   daratumumab 400 MG/20ML Commonly known as: DARZALEX Inject into the vein.   hydrochlorothiazide 12.5 MG capsule Commonly known as: MICROZIDE Take 12.5 mg by mouth every morning.   letrozole 2.5 MG tablet Commonly known as: FEMARA Take 1 tablet (2.5 mg total) by mouth daily.   loperamide 2 MG capsule Commonly known as: IMODIUM Take by mouth as needed for diarrhea or loose stools. Reported on 08/19/2015   loratadine 10 MG tablet Commonly known as: CLARITIN Take 10 mg by mouth every morning. Reported on 08/19/2015   metoprolol succinate 50 MG 24 hr tablet Commonly known as: TOPROL-XL Take 1 tablet (50 mg total) by mouth every morning.   montelukast 10 MG tablet Commonly known as: SINGULAIR TAKE 1 TABLET(10 MG) BY MOUTH AT BEDTIME   Nasacort Allergy 24HR 55 MCG/ACT Aero nasal inhaler Generic drug: triamcinolone Place 2 sprays into the nose as needed.   ondansetron 8  MG tablet Commonly known as: Zofran Take 1 tablet (8 mg total) by mouth 2 (two) times daily as needed (Nausea or vomiting).   potassium chloride SA 20 MEQ tablet Commonly known as: KLOR-CON TAKE 1 TABLET(20 MEQ) BY MOUTH TWICE DAILY   PROBIOTIC DAILY PO Take by mouth daily.   prochlorperazine 10 MG tablet Commonly known as: COMPAZINE Take 1 tablet (10 mg total) by mouth every 6 (six) hours as needed (Nausea or vomiting).   triamterene-hydrochlorothiazide 37.5-25 MG  tablet Commonly known as: MAXZIDE-25 1 tablet by mouth at bedtime   Vitamin D3 50 MCG (2000 UT) Tabs Take 2 tablets by mouth daily.       Allergies:  Allergies  Allergen Reactions  . Codeine Nausea Only    Past Medical History, Surgical history, Social history, and Family History were reviewed and updated.  Review of Systems: All other 10 point review of systems is negative.   Physical Exam:  height is 5\' 2"  (1.575 m) and weight is 193 lb (87.5 kg). Her oral temperature is 97.6 F (36.4 C). Her blood pressure is 131/68 and her pulse is 73. Her respiration is 16 and oxygen saturation is 98%.   Wt Readings from Last 3 Encounters:  10/01/19 193 lb (87.5 kg)  09/24/19 189 lb (85.7 kg)  09/24/19 189 lb 3.2 oz (85.8 kg)    Ocular: Sclerae unicteric, pupils equal, round and reactive to light Ear-nose-throat: Oropharynx clear, dentition fair Lymphatic: No cervical or supraclavicular adenopathy Lungs no rales or rhonchi, good excursion bilaterally Heart regular rate and rhythm, no murmur appreciated Abd soft, nontender, positive bowel sounds MSK no focal spinal tenderness, no joint edema Neuro: non-focal, well-oriented, appropriate affect Breasts: Deferred   Lab Results  Component Value Date   WBC 3.2 (L) 10/01/2019   HGB 10.3 (L) 10/01/2019   HCT 31.2 (L) 10/01/2019   MCV 85.0 10/01/2019   PLT 66 (L) 10/01/2019   Lab Results  Component Value Date   FERRITIN 2,207 (H) 08/21/2019   IRON 91 08/21/2019   TIBC 314 08/21/2019   UIBC 223 08/21/2019   IRONPCTSAT 29 08/21/2019   Lab Results  Component Value Date   RETICCTPCT 1.9 10/01/2019   RBC 3.66 (L) 10/01/2019   RBC 3.67 (L) 10/01/2019   Lab Results  Component Value Date   KPAFRELGTCHN 2.5 (L) 08/21/2019   LAMBDASER 184.5 (H) 08/21/2019   KAPLAMBRATIO 0.01 (L) 08/21/2019   Lab Results  Component Value Date   IGGSERUM 299 (L) 08/21/2019   IGGSERUM 338 (L) 08/21/2019   IGA 19 (L) 08/21/2019   IGA 20 (L)  08/21/2019   IGMSERUM <5 (L) 08/21/2019   IGMSERUM <5 (L) 08/21/2019   Lab Results  Component Value Date   TOTALPROTELP 5.9 (L) 08/21/2019   ALBUMINELP 3.7 08/21/2019   A1GS 0.2 08/21/2019   A2GS 0.8 08/21/2019   BETS 0.9 08/21/2019   BETA2SER 0.4 11/23/2014   GAMS 0.3 (L) 08/21/2019   MSPIKE 0.2 (H) 08/21/2019   SPEI Comment 06/26/2019     Chemistry      Component Value Date/Time   NA 142 08/28/2019 0900   NA 141 01/10/2017 1115   NA 140 06/21/2016 0918   K 3.4 (L) 08/28/2019 0900   K 4.0 01/10/2017 1115   K 4.3 06/21/2016 0918   CL 108 08/28/2019 0900   CL 106 01/10/2017 1115   CO2 26 08/28/2019 0900   CO2 27 01/10/2017 1115   CO2 20 (L) 06/21/2016 0918   BUN 13  08/28/2019 0900   BUN 15 01/10/2017 1115   BUN 15.8 06/21/2016 0918   CREATININE 0.84 08/28/2019 0900   CREATININE 0.82 08/21/2019 1111   CREATININE 1.0 01/10/2017 1115   CREATININE 0.8 06/21/2016 0918      Component Value Date/Time   CALCIUM 9.5 08/28/2019 0900   CALCIUM 9.5 01/10/2017 1115   CALCIUM 9.4 06/21/2016 0918   ALKPHOS 37 (L) 08/28/2019 0900   ALKPHOS 40 01/10/2017 1115   ALKPHOS 66 06/21/2016 0918   AST 12 (L) 08/28/2019 0900   AST 10 (L) 08/21/2019 1111   AST 17 06/21/2016 0918   ALT 13 08/28/2019 0900   ALT 13 08/21/2019 1111   ALT 19 01/10/2017 1115   ALT 37 06/21/2016 0918   BILITOT 0.3 08/28/2019 0900   BILITOT 0.5 08/21/2019 1111   BILITOT 0.32 06/21/2016 0918       Impression and Plan: [name] is a very pleasant 69 yo African American female with recurrent lambda light chain myeloma. Her second a stem cell transplant for light chain myeloma was in June 2016. Shealsohaslocalized recurrent endometrial cancer (followed by gyn onc Dr. Nelly Rout) and is currently on Femara.  She did well with her first cycle of Melflufen. No complaints at this time.  She had a drop in her platelets from 223 to 66. I spoke with Dr. Myna Hidalgo and we will hold treatment a week. She will come  back in next week for lab and resume treatment if platelets have improved.  Repeat myeloma studies were drawn today and results are pending.  We will plan to see her again in another month for follow-up.  She can contact our office with any questions or concerns. We can certainly see her sooner if needed.   Emeline Gins, NP 9/1/202110:12 AM

## 2019-10-01 NOTE — Patient Instructions (Signed)

## 2019-10-01 NOTE — Progress Notes (Signed)
No treatment today d/t platelet count-66 per order of S. Cobre NP.

## 2019-10-01 NOTE — Addendum Note (Signed)
Addended by: San Morelle on: 10/01/2019 11:03 AM   Modules accepted: Orders

## 2019-10-01 NOTE — Telephone Encounter (Signed)
The pt left a message that when she went to her oncologist office for a visit and told them that DR. Baird Cancer wanted to know if they could draw some additional labs and fax them to her they said that Dr. Baird Cancer office needed to fax over a request. When I called the pt back she said that Dr. Baird Cancer had the request faxed over before she left the oncologist office because the told her when she got ready to check out.

## 2019-10-02 LAB — PROTEIN ELECTROPHORESIS, SERUM
A/G Ratio: 2 — ABNORMAL HIGH (ref 0.7–1.7)
Albumin ELP: 4.1 g/dL (ref 2.9–4.4)
Alpha-1-Globulin: 0.2 g/dL (ref 0.0–0.4)
Alpha-2-Globulin: 0.7 g/dL (ref 0.4–1.0)
Beta Globulin: 0.9 g/dL (ref 0.7–1.3)
Gamma Globulin: 0.2 g/dL — ABNORMAL LOW (ref 0.4–1.8)
Globulin, Total: 2.1 g/dL — ABNORMAL LOW (ref 2.2–3.9)
Total Protein ELP: 6.2 g/dL (ref 6.0–8.5)

## 2019-10-02 LAB — KAPPA/LAMBDA LIGHT CHAINS
Kappa free light chain: 1.6 mg/L — ABNORMAL LOW (ref 3.3–19.4)
Kappa, lambda light chain ratio: 0.01 — ABNORMAL LOW (ref 0.26–1.65)
Lambda free light chains: 181.4 mg/L — ABNORMAL HIGH (ref 5.7–26.3)

## 2019-10-02 LAB — IGG, IGA, IGM
IgA: 18 mg/dL — ABNORMAL LOW (ref 87–352)
IgG (Immunoglobin G), Serum: 270 mg/dL — ABNORMAL LOW (ref 586–1602)
IgM (Immunoglobulin M), Srm: <5 mg/dL — ABNORMAL LOW (ref 26–217)

## 2019-10-02 LAB — IRON AND TIBC
Iron: 102 ug/dL (ref 41–142)
Saturation Ratios: 32 % (ref 21–57)
TIBC: 321 ug/dL (ref 236–444)
UIBC: 219 ug/dL (ref 120–384)

## 2019-10-02 LAB — FERRITIN: Ferritin: 2402 ng/mL — ABNORMAL HIGH (ref 11–307)

## 2019-10-03 ENCOUNTER — Telehealth: Payer: Self-pay

## 2019-10-03 ENCOUNTER — Telehealth: Payer: Medicare Other

## 2019-10-03 NOTE — Telephone Encounter (Cosign Needed)
Chronic Care Management   Outreach Note  10/03/2019 Name: Emily Greer MRN: [id number] DOB: 04/18/1950  Referred by: [name], MD Reason for referral : Chronic Care Management (FU RN CM Call )   An unsuccessful telephone outreach was attempted today. The patient was referred to the case management team for assistance with care management and care coordination.   Follow Up Plan: Telephone follow up appointment with care management team member scheduled for: 11/12/19  Delsa Sale, RN, BSN, CCM Care Management Coordinator [facility] Care Management/[facility]  Direct Phone: [phone]

## 2019-10-06 ENCOUNTER — Encounter: Payer: Self-pay | Admitting: Internal Medicine

## 2019-10-08 ENCOUNTER — Inpatient Hospital Stay: Payer: Medicare Other

## 2019-10-08 ENCOUNTER — Other Ambulatory Visit: Payer: Self-pay

## 2019-10-08 DIAGNOSIS — R319 Hematuria, unspecified: Secondary | ICD-10-CM | POA: Diagnosis not present

## 2019-10-08 DIAGNOSIS — C9000 Multiple myeloma not having achieved remission: Secondary | ICD-10-CM | POA: Diagnosis not present

## 2019-10-08 DIAGNOSIS — D500 Iron deficiency anemia secondary to blood loss (chronic): Secondary | ICD-10-CM | POA: Diagnosis not present

## 2019-10-08 DIAGNOSIS — Z5111 Encounter for antineoplastic chemotherapy: Secondary | ICD-10-CM | POA: Diagnosis not present

## 2019-10-08 DIAGNOSIS — C9001 Multiple myeloma in remission: Secondary | ICD-10-CM

## 2019-10-08 DIAGNOSIS — C541 Malignant neoplasm of endometrium: Secondary | ICD-10-CM | POA: Diagnosis not present

## 2019-10-08 DIAGNOSIS — Z79899 Other long term (current) drug therapy: Secondary | ICD-10-CM | POA: Diagnosis not present

## 2019-10-08 LAB — CBC WITH DIFFERENTIAL (CANCER CENTER ONLY)
Abs Immature Granulocytes: 0.01 10*3/uL (ref 0.00–0.07)
Basophils Absolute: 0 10*3/uL (ref 0.0–0.1)
Basophils Relative: 1 %
Eosinophils Absolute: 0.1 10*3/uL (ref 0.0–0.5)
Eosinophils Relative: 2 %
HCT: 30.5 % — ABNORMAL LOW (ref 36.0–46.0)
Hemoglobin: 10 g/dL — ABNORMAL LOW (ref 12.0–15.0)
Immature Granulocytes: 0 %
Lymphocytes Relative: 16 %
Lymphs Abs: 0.5 10*3/uL — ABNORMAL LOW (ref 0.7–4.0)
MCH: 28.2 pg (ref 26.0–34.0)
MCHC: 32.8 g/dL (ref 30.0–36.0)
MCV: 85.9 fL (ref 80.0–100.0)
Monocytes Absolute: 0.4 10*3/uL (ref 0.1–1.0)
Monocytes Relative: 13 %
Neutro Abs: 2 10*3/uL (ref 1.7–7.7)
Neutrophils Relative %: 68 %
Platelet Count: 103 10*3/uL — ABNORMAL LOW (ref 150–400)
RBC: 3.55 MIL/uL — ABNORMAL LOW (ref 3.87–5.11)
RDW: 16.3 % — ABNORMAL HIGH (ref 11.5–15.5)
WBC Count: 2.9 10*3/uL — ABNORMAL LOW (ref 4.0–10.5)
nRBC: 0 % (ref 0.0–0.2)

## 2019-10-08 LAB — CMP (CANCER CENTER ONLY)
ALT: 14 U/L (ref 0–44)
AST: 11 U/L — ABNORMAL LOW (ref 15–41)
Albumin: 4.3 g/dL (ref 3.5–5.0)
Alkaline Phosphatase: 34 U/L — ABNORMAL LOW (ref 38–126)
Anion gap: 9 (ref 5–15)
BUN: 15 mg/dL (ref 8–23)
CO2: 27 mmol/L (ref 22–32)
Calcium: 9.6 mg/dL (ref 8.9–10.3)
Chloride: 106 mmol/L (ref 98–111)
Creatinine: 0.89 mg/dL (ref 0.44–1.00)
GFR, Est AFR Am: >60 mL/min (ref >60)
GFR, Estimated: >60 mL/min (ref >60)
Glucose, Bld: 97 mg/dL (ref 70–99)
Potassium: 3.5 mmol/L (ref 3.5–5.1)
Sodium: 142 mmol/L (ref 135–145)
Total Bilirubin: 0.3 mg/dL (ref 0.3–1.2)
Total Protein: 6 g/dL — ABNORMAL LOW (ref 6.5–8.1)

## 2019-10-08 MED ORDER — SODIUM CHLORIDE 0.9 % IV SOLN
Freq: Once | INTRAVENOUS | Status: AC
  Filled 2019-10-08: qty 250

## 2019-10-08 MED ORDER — PALONOSETRON HCL INJECTION 0.25 MG/5ML
0.2500 mg | Freq: Once | INTRAVENOUS | Status: AC
  Administered 2019-10-08: 0.25 mg via INTRAVENOUS

## 2019-10-08 MED ORDER — SODIUM CHLORIDE 0.9% FLUSH
10.0000 mL | INTRAVENOUS | Status: DC | PRN
  Administered 2019-10-08: 10 mL
  Filled 2019-10-08: qty 10

## 2019-10-08 MED ORDER — SODIUM CHLORIDE 0.9 % IV SOLN
40.0000 mg | Freq: Once | INTRAVENOUS | Status: AC
  Administered 2019-10-08: 40 mg via INTRAVENOUS
  Filled 2019-10-08: qty 80

## 2019-10-08 MED ORDER — HEPARIN SOD (PORK) LOCK FLUSH 100 UNIT/ML IV SOLN
500.0000 [IU] | Freq: Once | INTRAVENOUS | Status: AC | PRN
  Administered 2019-10-08: 500 [IU]
  Filled 2019-10-08: qty 5

## 2019-10-08 MED ORDER — SODIUM CHLORIDE 0.9 % IV SOLN
40.0000 mg | Freq: Once | INTRAVENOUS | Status: AC
  Administered 2019-10-08: 40 mg via INTRAVENOUS
  Filled 2019-10-08: qty 4

## 2019-10-08 MED ORDER — PALONOSETRON HCL INJECTION 0.25 MG/5ML
INTRAVENOUS | Status: AC
  Filled 2019-10-08: qty 5

## 2019-10-08 NOTE — Patient Instructions (Signed)
[facility] Discharge Instructions for Patients Receiving Chemotherapy  Today you received the following chemotherapy agents Pepaxto.  Continue taking potassium tablets and eat potassium rich foods.  To help prevent nausea and vomiting after your treatment, we encourage you to take your nausea medication as directed BUT NO ZOFRAN FOR 3 DAYS AFTER CHEMO. Can take Compazine first and then Ativan as second or third line of preventing nausea/vomiting.   If you develop nausea and vomiting that is not controlled by your nausea medication, call the clinic.   BELOW ARE SYMPTOMS THAT SHOULD BE REPORTED IMMEDIATELY:  *FEVER GREATER THAN 100.5 F  *CHILLS WITH OR WITHOUT FEVER  NAUSEA AND VOMITING THAT IS NOT CONTROLLED WITH YOUR NAUSEA MEDICATION  *UNUSUAL SHORTNESS OF BREATH  *UNUSUAL BRUISING OR BLEEDING  TENDERNESS IN MOUTH AND THROAT WITH OR WITHOUT PRESENCE OF ULCERS  *URINARY PROBLEMS  *BOWEL PROBLEMS  UNUSUAL RASH Items with * indicate a potential emergency and should be followed up as soon as possible.  Feel free to call the clinic you have any questions or concerns. The clinic phone number is [phone].  Please show the CHEMO ALERT CARD at check-in to the Emergency Department and triage nurse.  Melphalan injection What is this medicine? MELPHALAN (MEL fa lan) is a chemotherapy drug. This medicine is used to treat multiple myeloma. It is also used prior to a stem cell transplant in patients with multiple myeloma. This medicine may be used for other purposes; ask your health care provider or pharmacist if you have questions. COMMON BRAND NAME(S): Alkeran, Evomela What should I tell my health care provider before I take this medicine? They need to know if you have any of these conditions:  infection  kidney disease  liver disease  low blood counts, like low white cell, platelet, or red cell counts  prior chemotherapy or radiation therapy  an unusual  or allergic reaction to melphalan, other chemotherapy, other medicines, foods, dyes, or preservatives  pregnant or trying to get pregnant  breast-feeding How should I use this medicine? This drug is is for infusion into a vein. It is administered in a hospital or clinic by a specially trained health care professional. Talk to your pediatrician regarding the use of this medicine in children. Special care may be needed. Overdosage: If you think you have taken too much of this medicine contact a poison control center or emergency room at once. NOTE: This medicine is only for you. Do not share this medicine with others. What if I miss a dose? It is important not to miss your dose. Call your doctor or health care professional if you are unable to keep an appointment. What may interact with this medicine? Do not take this medicine with the following medication:  nalidixic acid This medicine may also interact with the following medications:  carmustine  cisplatin  cyclosporine  live vaccines This list may not describe all possible interactions. Give your health care provider a list of all the medicines, herbs, non-prescription drugs, or dietary supplements you use. Also tell them if you smoke, drink alcohol, or use illegal drugs. Some items may interact with your medicine. What should I watch for while using this medicine? This drug may make you feel generally unwell. This is not uncommon, as chemotherapy can affect healthy cells as well as cancer cells. Report any side effects. Continue your course of treatment even though you feel ill unless your doctor tells you to stop. In some cases, you may be   given additional medicines to help with side effects. Follow all directions for their use. Call your doctor or health care professional for advice if you get a fever, chills or sore throat, or other symptoms of a cold or flu. Do not treat yourself. This drug decreases your body's ability to fight  infections. Try to avoid being around people who are sick. This medicine may increase your risk to bruise or bleed. Call your doctor or health care professional if you notice any unusual bleeding. You may need blood work done while you are taking this medicine. Talk to your doctor about your risk of cancer. You may be more at risk for certain types of cancers if you take this medicine. Do not become pregnant while taking this medicine or for a while after stopping it. Women should inform their doctor if they wish to become pregnant or think they might be pregnant. Men should not father a child while taking this medicine and for a while after stopping it. There is a potential for serious side effects to an unborn child. Talk to your health care professional or pharmacist for more information. Do not breast-feed an infant while taking this medicine. This medicine has caused ovarian failure in some women. This medicine may interfere with the ability to have a child. This medicine has caused reduced sperm counts in some men. This may interfere with the ability to father a child. You should talk with your doctor or health care professional if you are concerned about your fertility. What side effects may I notice from receiving this medicine? Side effects that you should report to your doctor or health care professional as soon as possible:  allergic reactions like skin rash, itching or hives, swelling of the face, lips, or tongue  breathing problems  blurred vision  diarrhea  dizziness  fast heartbeat  low blood counts - this medicine may decrease the number of white blood cells, red blood cells and platelets. You may be at increased risk for infections and bleeding.  missed menstrual periods  mouth sores  nausea, vomiting  signs of infection - fever or chills, cough, sore throat, pain or difficulty passing urine  signs of decreased platelets or bleeding - bruising, pinpoint red spots on  the skin, black, tarry stools, blood in the urine  signs of decreased red blood cells - unusually weak or tired, fainting spells, lightheadedness  signs and symptoms of liver injury like dark yellow or brown urine; general ill feeling or flu-like symptoms; light-colored stools; loss of appetite; nausea; right upper belly pain; unusually weak or tired; yellowing of the eyes or skin  weight loss Side effects that usually do not require medical attention (report to your doctor or health care professional if they continue or are bothersome):  constipation  hair loss  tiredness This list may not describe all possible side effects. Call your doctor for medical advice about side effects. You may report side effects to FDA at 1-800-FDA-1088. Where should I keep my medicine? This drug is given in a hospital or clinic and will not be stored at home. NOTE: This sheet is a summary. It may not cover all possible information. If you have questions about this medicine, talk to your doctor, pharmacist, or health care provider.  2020 Elsevier/Gold Standard (2014-06-23 11:06:09)   

## 2019-10-10 ENCOUNTER — Other Ambulatory Visit: Payer: Self-pay | Admitting: Hematology & Oncology

## 2019-10-10 ENCOUNTER — Other Ambulatory Visit: Payer: Self-pay

## 2019-10-10 DIAGNOSIS — C9000 Multiple myeloma not having achieved remission: Secondary | ICD-10-CM

## 2019-10-10 MED ORDER — LIDOCAINE-PRILOCAINE 2.5-2.5 % EX CREA: 1.0000 "application " | TOPICAL_CREAM | CUTANEOUS | 3 refills | Status: DC | PRN

## 2019-10-23 ENCOUNTER — Telehealth: Payer: Self-pay | Admitting: Hematology & Oncology

## 2019-10-23 NOTE — Telephone Encounter (Signed)
Appointments rescheduled updated calendar printed & mailed per 9/23 secure chat from Pharmacy

## 2019-10-30 ENCOUNTER — Other Ambulatory Visit: Payer: Medicare Other

## 2019-10-30 ENCOUNTER — Ambulatory Visit: Payer: Medicare Other

## 2019-10-30 ENCOUNTER — Ambulatory Visit: Payer: Medicare Other | Admitting: Family

## 2019-10-30 ENCOUNTER — Inpatient Hospital Stay: Payer: Medicare Other

## 2019-10-30 ENCOUNTER — Inpatient Hospital Stay: Payer: Medicare Other | Admitting: Hematology & Oncology

## 2019-11-05 ENCOUNTER — Telehealth: Payer: Self-pay | Admitting: Hematology & Oncology

## 2019-11-05 ENCOUNTER — Inpatient Hospital Stay: Payer: Medicare Other | Attending: Hematology & Oncology

## 2019-11-05 ENCOUNTER — Inpatient Hospital Stay (HOSPITAL_BASED_OUTPATIENT_CLINIC_OR_DEPARTMENT_OTHER): Payer: Medicare Other | Admitting: Hematology & Oncology

## 2019-11-05 ENCOUNTER — Encounter: Payer: Medicare Other | Admitting: Obstetrics & Gynecology

## 2019-11-05 ENCOUNTER — Encounter: Payer: Self-pay | Admitting: Obstetrics & Gynecology

## 2019-11-05 ENCOUNTER — Ambulatory Visit (INDEPENDENT_AMBULATORY_CARE_PROVIDER_SITE_OTHER): Payer: Medicare Other | Admitting: Obstetrics & Gynecology

## 2019-11-05 ENCOUNTER — Other Ambulatory Visit (HOSPITAL_COMMUNITY)
Admission: RE | Admit: 2019-11-05 | Discharge: 2019-11-05 | Disposition: A | Discharge status: Home / Self Care | Payer: Medicare Other | Source: Ambulatory Visit | Attending: Obstetrics & Gynecology | Admitting: Obstetrics & Gynecology

## 2019-11-05 ENCOUNTER — Encounter: Payer: Self-pay | Admitting: Hematology & Oncology

## 2019-11-05 ENCOUNTER — Inpatient Hospital Stay: Payer: Medicare Other

## 2019-11-05 ENCOUNTER — Other Ambulatory Visit: Payer: Self-pay

## 2019-11-05 VITALS — BP 130/71 | HR 86 | Ht 62.0 in | Wt 191.0 lb

## 2019-11-05 VITALS — BP 147/78 | HR 97 | Temp 98.2°F | Resp 18 | Wt 189.0 lb

## 2019-11-05 DIAGNOSIS — C9000 Multiple myeloma not having achieved remission: Secondary | ICD-10-CM

## 2019-11-05 DIAGNOSIS — Z5111 Encounter for antineoplastic chemotherapy: Secondary | ICD-10-CM | POA: Diagnosis not present

## 2019-11-05 DIAGNOSIS — Z923 Personal history of irradiation: Secondary | ICD-10-CM | POA: Diagnosis not present

## 2019-11-05 DIAGNOSIS — N893 Dysplasia of vagina, unspecified: Secondary | ICD-10-CM

## 2019-11-05 DIAGNOSIS — C9001 Multiple myeloma in remission: Secondary | ICD-10-CM

## 2019-11-05 DIAGNOSIS — R87810 Cervical high risk human papillomavirus (HPV) DNA test positive: Secondary | ICD-10-CM | POA: Insufficient documentation

## 2019-11-05 DIAGNOSIS — Z1151 Encounter for screening for human papillomavirus (HPV): Secondary | ICD-10-CM | POA: Insufficient documentation

## 2019-11-05 DIAGNOSIS — Z8542 Personal history of malignant neoplasm of other parts of uterus: Secondary | ICD-10-CM | POA: Diagnosis not present

## 2019-11-05 DIAGNOSIS — D509 Iron deficiency anemia, unspecified: Secondary | ICD-10-CM | POA: Insufficient documentation

## 2019-11-05 DIAGNOSIS — Z885 Allergy status to narcotic agent status: Secondary | ICD-10-CM | POA: Diagnosis not present

## 2019-11-05 DIAGNOSIS — R87610 Atypical squamous cells of undetermined significance on cytologic smear of cervix (ASC-US): Secondary | ICD-10-CM | POA: Diagnosis not present

## 2019-11-05 DIAGNOSIS — Z01419 Encounter for gynecological examination (general) (routine) without abnormal findings: Secondary | ICD-10-CM | POA: Diagnosis not present

## 2019-11-05 DIAGNOSIS — C541 Malignant neoplasm of endometrium: Secondary | ICD-10-CM

## 2019-11-05 DIAGNOSIS — Z1239 Encounter for other screening for malignant neoplasm of breast: Secondary | ICD-10-CM | POA: Diagnosis not present

## 2019-11-05 DIAGNOSIS — D500 Iron deficiency anemia secondary to blood loss (chronic): Secondary | ICD-10-CM

## 2019-11-05 DIAGNOSIS — Z8673 Personal history of transient ischemic attack (TIA), and cerebral infarction without residual deficits: Secondary | ICD-10-CM | POA: Insufficient documentation

## 2019-11-05 LAB — CBC WITH DIFFERENTIAL (CANCER CENTER ONLY)
Abs Immature Granulocytes: 0.01 10*3/uL (ref 0.00–0.07)
Basophils Absolute: 0 10*3/uL (ref 0.0–0.1)
Basophils Relative: 1 %
Eosinophils Absolute: 0 10*3/uL (ref 0.0–0.5)
Eosinophils Relative: 1 %
HCT: 30.8 % — ABNORMAL LOW (ref 36.0–46.0)
Hemoglobin: 10.1 g/dL — ABNORMAL LOW (ref 12.0–15.0)
Immature Granulocytes: 0 %
Lymphocytes Relative: 7 %
Lymphs Abs: 0.3 10*3/uL — ABNORMAL LOW (ref 0.7–4.0)
MCH: 28.3 pg (ref 26.0–34.0)
MCHC: 32.8 g/dL (ref 30.0–36.0)
MCV: 86.3 fL (ref 80.0–100.0)
Monocytes Absolute: 0.5 10*3/uL (ref 0.1–1.0)
Monocytes Relative: 13 %
Neutro Abs: 2.8 10*3/uL (ref 1.7–7.7)
Neutrophils Relative %: 78 %
Platelet Count: 74 10*3/uL — ABNORMAL LOW (ref 150–400)
RBC: 3.57 MIL/uL — ABNORMAL LOW (ref 3.87–5.11)
RDW: 16.6 % — ABNORMAL HIGH (ref 11.5–15.5)
WBC Count: 3.6 10*3/uL — ABNORMAL LOW (ref 4.0–10.5)
nRBC: 0 % (ref 0.0–0.2)

## 2019-11-05 LAB — IRON AND TIBC
Iron: 110 ug/dL (ref 41–142)
Saturation Ratios: 33 % (ref 21–57)
TIBC: 328 ug/dL (ref 236–444)
UIBC: 218 ug/dL (ref 120–384)

## 2019-11-05 LAB — CMP (CANCER CENTER ONLY)
ALT: 13 U/L (ref 0–44)
AST: 11 U/L — ABNORMAL LOW (ref 15–41)
Albumin: 4.4 g/dL (ref 3.5–5.0)
Alkaline Phosphatase: 34 U/L — ABNORMAL LOW (ref 38–126)
Anion gap: 8 (ref 5–15)
BUN: 18 mg/dL (ref 8–23)
CO2: 28 mmol/L (ref 22–32)
Calcium: 9.8 mg/dL (ref 8.9–10.3)
Chloride: 105 mmol/L (ref 98–111)
Creatinine: 0.92 mg/dL (ref 0.44–1.00)
GFR, Estimated: >60 mL/min (ref >60)
Glucose, Bld: 99 mg/dL (ref 70–99)
Potassium: 3.7 mmol/L (ref 3.5–5.1)
Sodium: 141 mmol/L (ref 135–145)
Total Bilirubin: 0.4 mg/dL (ref 0.3–1.2)
Total Protein: 6.1 g/dL — ABNORMAL LOW (ref 6.5–8.1)

## 2019-11-05 LAB — FERRITIN: Ferritin: 2550 ng/mL — ABNORMAL HIGH (ref 11–307)

## 2019-11-05 LAB — RETICULOCYTES
Immature Retic Fract: 14.4 % (ref 2.3–15.9)
RBC.: 3.52 MIL/uL — ABNORMAL LOW (ref 3.87–5.11)
Retic Count, Absolute: 56 10*3/uL (ref 19.0–186.0)
Retic Ct Pct: 1.6 % (ref 0.4–3.1)

## 2019-11-05 LAB — LACTATE DEHYDROGENASE: LDH: 153 U/L (ref 98–192)

## 2019-11-05 MED ORDER — SODIUM CHLORIDE 0.9% FLUSH
10.0000 mL | INTRAVENOUS | Status: DC | PRN
  Administered 2019-11-05: 10 mL
  Filled 2019-11-05: qty 10

## 2019-11-05 MED ORDER — DENOSUMAB 120 MG/1.7ML ~~LOC~~ SOLN
120.0000 mg | Freq: Once | SUBCUTANEOUS | Status: AC
  Administered 2019-11-05: 120 mg via SUBCUTANEOUS

## 2019-11-05 MED ORDER — PALONOSETRON HCL INJECTION 0.25 MG/5ML
INTRAVENOUS | Status: AC
  Filled 2019-11-05: qty 5

## 2019-11-05 MED ORDER — SODIUM CHLORIDE 0.9 % IV SOLN
40.0000 mg | Freq: Once | INTRAVENOUS | Status: AC
  Administered 2019-11-05: 40 mg via INTRAVENOUS
  Filled 2019-11-05: qty 80

## 2019-11-05 MED ORDER — DENOSUMAB 120 MG/1.7ML ~~LOC~~ SOLN
SUBCUTANEOUS | Status: AC
  Filled 2019-11-05: qty 1.7

## 2019-11-05 MED ORDER — HEPARIN SOD (PORK) LOCK FLUSH 100 UNIT/ML IV SOLN
500.0000 [IU] | Freq: Once | INTRAVENOUS | Status: AC | PRN
  Administered 2019-11-05: 500 [IU]
  Filled 2019-11-05: qty 5

## 2019-11-05 MED ORDER — PALONOSETRON HCL INJECTION 0.25 MG/5ML
0.2500 mg | Freq: Once | INTRAVENOUS | Status: AC
  Administered 2019-11-05: 0.25 mg via INTRAVENOUS

## 2019-11-05 MED ORDER — SODIUM CHLORIDE 0.9 % IV SOLN
40.0000 mg | Freq: Once | INTRAVENOUS | Status: AC
  Administered 2019-11-05: 40 mg via INTRAVENOUS
  Filled 2019-11-05: qty 4

## 2019-11-05 MED ORDER — SODIUM CHLORIDE 0.9 % IV SOLN
Freq: Once | INTRAVENOUS | Status: AC
  Filled 2019-11-05: qty 250

## 2019-11-05 NOTE — Progress Notes (Signed)
Okay to give Emily Greer a few weeks early today per Dr. Marin Olp.

## 2019-11-05 NOTE — Progress Notes (Signed)
Ok to treat with platelets of 74. dph

## 2019-11-05 NOTE — Progress Notes (Signed)
Subjective:     Emily Greer is a 69 y.o. female here for a routine exam.  Current complaints: none. Pt denies bleeding. She has not been sexually active since tx. Her father recently died now her mother lives with her. She husband recently had a  CVA but,is doing well.    04/11/2019 See by Dr. [name]  Endometrial cancer (CMS-HCC) (Primary Dx); 2013 recurrence 2016 VAIN III (vaginal intraepithelial neoplasia grade III)  Notes obtained from [facility]. Was seen prev by Dr. Leo Grosser.        Gynecologic History No LMP recorded. Patient has had a hysterectomy.  Pt is s/p total abdominal hysterectomy bilateral salpingo-oophorectomy bilateral pelvic lymph node dissection on 19/50/9326 without complications.   Contraception: post menopausal status  Last Pap: Pap 07/2016 low-grade SIL  Last mammogram: 2012 (?). Has mammogram scheudled on Mon at [place] History OB History  Gravida Para Term Preterm AB Living  $Remov'3 2     1 2  'ZneEfy$ SAB TAB Ectopic Multiple Live Births  1       2    # Outcome Date GA Lbr Len/2nd Weight Sex Delivery Anes PTL Lv  3 SAB 1992          2 Para 1985    U Vag-Spont   LIV  1 Para 1982    U Vag-Spont   LIV   Patient Active Problem List   Diagnosis Date Noted  . Goals of care, counseling/discussion 08/21/2019  . Osteonecrosis, unspecified ([place]) 03/31/2019  . VAIN I (vaginal intraepithelial neoplasia grade I) 10/10/2018  . Influenza B 02/18/2018  . Flu-like symptoms 02/18/2018  . Excessive cerumen in both ear canals 02/18/2018  . Abnormal Pap smear of vagina 12/30/2015  . 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 Iron deficiency anemia due to chronic blood loss   . HCAP (healthcare-associated pneumonia) 12/29/2014  . UTI (lower urinary tract infection) 12/29/2014  . Hypokalemia 12/29/2014  . CAP (community acquired pneumonia) 12/29/2014  . Bone marrow transplant status ([place]) 07/25/2014  . S/P  autologous bone marrow transplantation ([place]) 07/25/2014  . Fever 05/31/2014  . ARF (acute renal failure) ([place]) 05/31/2014  . Anemia 05/31/2014  . Renal failure   . Examination of participant in clinical trial 05/19/2014  . Encounter for examination for normal comparison or control in clinical research program 05/19/2014  . Kahler disease ([place]) 05/05/2014  . Multiple myeloma in remission ([place]) 05/05/2014  . History of radiation therapy   . Endometrial carcinoma ([place]) 09/28/2011  . Pregnancy induced hypertension   . Elevated hemoglobin A1c   . Myeloma ([place]) 01/17/2011  . Lambda light chain myeloma ([place]) 11/11/2007  . Vaginal atrophy 12/19/2006  . Increased BMI 12/18/2005  . Hypertension 11/28/1997  . H/O multiple myeloma 11/28/1997  . Staphylococcus aureus bacteremia 11/28/1997  . Staphylococcus epidermidis bacteremia 11/28/1997  . Essential (primary) hypertension 11/28/1997   The following portions of the patient's history were reviewed and updated as appropriate: allergies, current medications, past family history, past medical history, past social history, past surgical history and problem list.  Review of Systems Pertinent items are noted in HPI.    Objective:  BP 130/71   Pulse 86   Ht $R'5\' 2"'ZA$  (1.575 m)   Wt 191 lb (86.6 kg)   BMI 34.93 kg/m  General Appearance:    Alert, cooperative, no distress, appears stated age  Head:    Normocephalic, without obvious abnormality, atraumatic  Eyes:  conjunctiva/corneas clear, EOM's intact, both eyes  Ears:    Normal external ear canals, both ears  Nose:   Nares normal, septum midline, mucosa normal, no drainage    or sinus tenderness  Throat:   Lips, mucosa, and tongue normal; teeth and gums normal  Neck:   Supple, symmetrical, trachea midline, no adenopathy;    thyroid:  no enlargement/tenderness/nodules  Back:     Symmetric, no curvature, ROM normal, no CVA tenderness  Lungs:     respirations unlabored  Chest Wall:    No  tenderness or deformity   Heart:    Regular rate and rhythm  Breast Exam:    No tenderness, masses, or nipple abnormality  Abdomen:     Soft, non-tender, bowel sounds active all four quadrants,    no masses, no organomegaly  Genitalia:    Normal female without lesion, discharge or tenderness   The vagina is foreshortened due to radiation changes. There is about 2 cm of vagina. NO lesions noted. Blind PAP performed.     Extremities:   Extremities normal, atraumatic, no cyanosis or edema  Pulses:   2+ and symmetric all extremities  Skin:   Skin color, texture, turgor normal, no rashes or lesions     Assessment:    Healthy female exam.   H/o VAIN H/o endometrial cancer- s/p hyst and brachy therapy. Pts GYN ONC recommended that she reestablish care with a primary GYN.      Plan:    f/u PAP mammogran in 5 days and annually F/u in 1 year or sooner prn  Aleisha Paone L. Harraway-Smith, M.D., [name]

## 2019-11-05 NOTE — Progress Notes (Signed)
Hematology and Oncology Follow Up Visit  Emily Greer [id number] 1950-10-27 69 y.o. 11/05/2019   Principle Diagnosis:  Recurrent lambda light chain myeloma - nl cytogenetics History of recurrent endometrial carcinoma Iron deficiency anemia-blood loss  PastTherapy: Status post second autologous stem cell transplant on 07/24/2014 Maintenance therapy with Pomalidomide/every 2 week Velcade - d/c'ed Radiation therapy for endometrial recurrence - completed 04/20/2015 Pomalyst/Kyprolis 70mg /m2 IV q 2 weeks - s/p cycle #12 - held on 12/26/2017 for hematuria Daratumumab/Pomalyst (1 mg) - status post cycle19 -- d/c on 08/21/2019  Current Therapy: Melflufen 40 mg IV q 4 weeks -- started on 08/27/2019, s/p cycle #2 Femara 2.5 mg po q day Xgeva 120 mg subcu every 3 months - next dose in 01/2020 IV iron as indicated   Interim History:  Emily Greer is here today for follow-up and treatment.  So far, she been doing pretty well.  She had no problems with the Melflufen.  She has had no nausea or vomiting.  Her husband is doing okay.  He had a cerebrovascular accident a few months ago.  He seems to be improving with his overall performance status.  Her last lambda light chain was 18.1 mg/dL.  Hopefully, we will see this continue to go down.  She has had no problems with respect to the endometrial cancer.  She has had no problems with nausea or vomiting.  She has had no fever.  She has had no leg swelling.  She has had no obvious change in bowel or bladder habits.  Overall, her performance status is ECOG 1.   Medications:  Allergies as of 11/05/2019      Reactions   Codeine Nausea Only      Medication List       Accurate as of November 05, 2019  9:24 AM. If you have any questions, ask your nurse or doctor.        acyclovir 400 MG tablet Commonly known as: ZOVIRAX TAKE 1 TABLET(400 MG) BY MOUTH DAILY   albuterol 108 (90 Base) MCG/ACT inhaler Commonly  known as: VENTOLIN HFA Inhale 2 puffs into the lungs every 6 (six) hours as needed for wheezing or shortness of breath. 2 puffs 3 times daily x 5 days then every 6 hours as needed.   amLODipine 10 MG tablet Commonly known as: NORVASC Take 1 tablet (10 mg total) by mouth every morning.   aspirin EC 81 MG tablet Take 81 mg by mouth 2 (two) times daily.   CALCIUM 1200+D3 PO Take 1 tablet by mouth daily.   daratumumab 400 MG/20ML Commonly known as: DARZALEX Inject into the vein.   hydrochlorothiazide 12.5 MG capsule Commonly known as: MICROZIDE Take 12.5 mg by mouth every morning.   letrozole 2.5 MG tablet Commonly known as: FEMARA Take 1 tablet (2.5 mg total) by mouth daily.   lidocaine-prilocaine cream Commonly known as: EMLA Apply 1 application topically as needed.   loperamide 2 MG capsule Commonly known as: IMODIUM Take by mouth as needed for diarrhea or loose stools. Reported on 08/19/2015   loratadine 10 MG tablet Commonly known as: CLARITIN Take 10 mg by mouth every morning. Reported on 08/19/2015   LORazepam 0.5 MG tablet Commonly known as: ATIVAN Take by mouth.   metoprolol succinate 50 MG 24 hr tablet Commonly known as: TOPROL-XL Take 1 tablet (50 mg total) by mouth every morning.   montelukast 10 MG tablet Commonly known as: SINGULAIR TAKE 1 TABLET(10 MG) BY MOUTH AT BEDTIME   Nasacort Allergy  24HR 55 MCG/ACT Aero nasal inhaler Generic drug: triamcinolone Place 2 sprays into the nose as needed.   ondansetron 8 MG tablet Commonly known as: Zofran Take 1 tablet (8 mg total) by mouth 2 (two) times daily as needed (Nausea or vomiting).   potassium chloride SA 20 MEQ tablet Commonly known as: KLOR-CON TAKE 1 TABLET(20 MEQ) BY MOUTH TWICE DAILY   PROBIOTIC DAILY PO Take by mouth daily.   prochlorperazine 10 MG tablet Commonly known as: COMPAZINE Take 1 tablet (10 mg total) by mouth every 6 (six) hours as needed (Nausea or vomiting).     triamterene-hydrochlorothiazide 37.5-25 MG tablet Commonly known as: MAXZIDE-25 1 tablet by mouth at bedtime   Vitamin D3 50 MCG (2000 UT) Tabs Take 2 tablets by mouth daily.       Allergies:  Allergies  Allergen Reactions  . Codeine Nausea Only    Past Medical History, Surgical history, Social history, and Family History were reviewed and updated.  Review of Systems: Review of Systems  Constitutional: Negative.   HENT: Negative.   Eyes: Negative.   Respiratory: Negative.   Cardiovascular: Negative.   Gastrointestinal: Negative.   Genitourinary: Negative.   Musculoskeletal: Negative.   Skin: Negative.   Neurological: Negative.   Endo/Heme/Allergies: Negative.   Psychiatric/Behavioral: Negative.       Physical Exam:  weight is 189 lb (85.7 kg). Her oral temperature is 98.2 F (36.8 C). Her blood pressure is 147/78 (abnormal) and her pulse is 97. Her respiration is 18 and oxygen saturation is 100%.   Wt Readings from Last 3 Encounters:  11/05/19 189 lb (85.7 kg)  10/01/19 193 lb (87.5 kg)  09/24/19 189 lb (85.7 kg)    Physical Exam Vitals reviewed.  HENT:     Head: Normocephalic and atraumatic.  Eyes:     Pupils: Pupils are equal, round, and reactive to light.  Cardiovascular:     Rate and Rhythm: Normal rate and regular rhythm.     Heart sounds: Normal heart sounds.  Pulmonary:     Effort: Pulmonary effort is normal.     Breath sounds: Normal breath sounds.  Abdominal:     General: Bowel sounds are normal.     Palpations: Abdomen is soft.  Musculoskeletal:        General: No tenderness or deformity. Normal range of motion.     Cervical back: Normal range of motion.  Lymphadenopathy:     Cervical: No cervical adenopathy.  Skin:    General: Skin is warm and dry.     Findings: No erythema or rash.  Neurological:     Mental Status: She is alert and oriented to person, place, and time.  Psychiatric:        Behavior: Behavior normal.        Thought  Content: Thought content normal.        Judgment: Judgment normal.      Lab Results  Component Value Date   WBC 3.6 (L) 11/05/2019   HGB 10.1 (L) 11/05/2019   HCT 30.8 (L) 11/05/2019   MCV 86.3 11/05/2019   PLT 74 (L) 11/05/2019   Lab Results  Component Value Date   FERRITIN 2,402 (H) 10/01/2019   IRON 102 10/01/2019   TIBC 321 10/01/2019   UIBC 219 10/01/2019   IRONPCTSAT 32 10/01/2019   Lab Results  Component Value Date   RETICCTPCT 1.6 11/05/2019   RBC 3.52 (L) 11/05/2019   Lab Results  Component Value Date   KPAFRELGTCHN 1.6 (  L) 10/01/2019   LAMBDASER 181.4 (H) 10/01/2019   KAPLAMBRATIO 0.01 (L) 10/01/2019   Lab Results  Component Value Date   IGGSERUM 270 (L) 10/01/2019   IGA 18 (L) 10/01/2019   IGMSERUM <5 (L) 10/01/2019   Lab Results  Component Value Date   TOTALPROTELP 6.2 10/01/2019   ALBUMINELP 4.1 10/01/2019   A1GS 0.2 10/01/2019   A2GS 0.7 10/01/2019   BETS 0.9 10/01/2019   BETA2SER 0.4 11/23/2014   GAMS 0.2 (L) 10/01/2019   MSPIKE Not Observed 10/01/2019   SPEI Comment 10/01/2019     Chemistry      Component Value Date/Time   NA 141 11/05/2019 0859   NA 141 01/10/2017 1115   NA 140 06/21/2016 0918   K 3.7 11/05/2019 0859   K 4.0 01/10/2017 1115   K 4.3 06/21/2016 0918   CL 105 11/05/2019 0859   CL 106 01/10/2017 1115   CO2 28 11/05/2019 0859   CO2 27 01/10/2017 1115   CO2 20 (L) 06/21/2016 0918   BUN 18 11/05/2019 0859   BUN 15 01/10/2017 1115   BUN 15.8 06/21/2016 0918   CREATININE 0.92 11/05/2019 0859   CREATININE 1.0 01/10/2017 1115   CREATININE 0.8 06/21/2016 0918      Component Value Date/Time   CALCIUM 9.8 11/05/2019 0859   CALCIUM 9.5 01/10/2017 1115   CALCIUM 9.4 06/21/2016 0918   ALKPHOS 34 (L) 11/05/2019 0859   ALKPHOS 40 01/10/2017 1115   ALKPHOS 66 06/21/2016 0918   AST 11 (L) 11/05/2019 0859   AST 17 06/21/2016 0918   ALT 13 11/05/2019 0859   ALT 19 01/10/2017 1115   ALT 37 06/21/2016 0918   BILITOT 0.4  11/05/2019 0859   BILITOT 0.32 06/21/2016 0918       Impression and Plan: Emily Greer is a very pleasant 69 yo African American female with recurrent lambda light chain myeloma. Her second stem cell transplant for light chain myeloma was in June 2016.   Shealsohaslocalized recurrent endometrial cancer (followed by gyn onc Dr. Nelly Rout) and is currently on Femara.   Hopefully, we will see that she is responding.  I noted that her platelet count was little bit on the lower side.  I am not too worried about this.  We will plan to get her back to see [place] in another month or so.  I may move her appointments out by week so that we can make sure her blood counts improve.  I think if we do not see that she responds to the Melflufen, then we may consider her for CAR-T therapy.  Josph Macho, MD 10/6/20219:24 AM

## 2019-11-05 NOTE — Telephone Encounter (Signed)
Appointments scheduled calendar printed per 10/ los

## 2019-11-05 NOTE — Patient Instructions (Signed)
[facility] Discharge Instructions for Patients Receiving Chemotherapy  Today you received the following chemotherapy agents Pepaxto.  Continue taking potassium tablets and eat potassium rich foods.  To help prevent nausea and vomiting after your treatment, we encourage you to take your nausea medication as directed BUT NO ZOFRAN FOR 3 DAYS AFTER CHEMO. Can take Compazine first and then Ativan as second or third line of preventing nausea/vomiting.   If you develop nausea and vomiting that is not controlled by your nausea medication, call the clinic.   BELOW ARE SYMPTOMS THAT SHOULD BE REPORTED IMMEDIATELY:  *FEVER GREATER THAN 100.5 F  *CHILLS WITH OR WITHOUT FEVER  NAUSEA AND VOMITING THAT IS NOT CONTROLLED WITH YOUR NAUSEA MEDICATION  *UNUSUAL SHORTNESS OF BREATH  *UNUSUAL BRUISING OR BLEEDING  TENDERNESS IN MOUTH AND THROAT WITH OR WITHOUT PRESENCE OF ULCERS  *URINARY PROBLEMS  *BOWEL PROBLEMS  UNUSUAL RASH Items with * indicate a potential emergency and should be followed up as soon as possible.  Feel free to call the clinic you have any questions or concerns. The clinic phone number is [phone].  Please show the CHEMO ALERT CARD at check-in to the Emergency Department and triage nurse.  Melphalan injection What is this medicine? MELPHALAN (MEL fa lan) is a chemotherapy drug. This medicine is used to treat multiple myeloma. It is also used prior to a stem cell transplant in patients with multiple myeloma. This medicine may be used for other purposes; ask your health care provider or pharmacist if you have questions. COMMON BRAND NAME(S): Alkeran, Evomela What should I tell my health care provider before I take this medicine? They need to know if you have any of these conditions:  infection  kidney disease  liver disease  low blood counts, like low white cell, platelet, or red cell counts  prior chemotherapy or radiation therapy  an unusual  or allergic reaction to melphalan, other chemotherapy, other medicines, foods, dyes, or preservatives  pregnant or trying to get pregnant  breast-feeding How should I use this medicine? This drug is is for infusion into a vein. It is administered in a hospital or clinic by a specially trained health care professional. Talk to your pediatrician regarding the use of this medicine in children. Special care may be needed. Overdosage: If you think you have taken too much of this medicine contact a poison control center or emergency room at once. NOTE: This medicine is only for you. Do not share this medicine with others. What if I miss a dose? It is important not to miss your dose. Call your doctor or health care professional if you are unable to keep an appointment. What may interact with this medicine? Do not take this medicine with the following medication:  nalidixic acid This medicine may also interact with the following medications:  carmustine  cisplatin  cyclosporine  live vaccines This list may not describe all possible interactions. Give your health care provider a list of all the medicines, herbs, non-prescription drugs, or dietary supplements you use. Also tell them if you smoke, drink alcohol, or use illegal drugs. Some items may interact with your medicine. What should I watch for while using this medicine? This drug may make you feel generally unwell. This is not uncommon, as chemotherapy can affect healthy cells as well as cancer cells. Report any side effects. Continue your course of treatment even though you feel ill unless your doctor tells you to stop. In some cases, you may be   given additional medicines to help with side effects. Follow all directions for their use. Call your doctor or health care professional for advice if you get a fever, chills or sore throat, or other symptoms of a cold or flu. Do not treat yourself. This drug decreases your body's ability to fight  infections. Try to avoid being around people who are sick. This medicine may increase your risk to bruise or bleed. Call your doctor or health care professional if you notice any unusual bleeding. You may need blood work done while you are taking this medicine. Talk to your doctor about your risk of cancer. You may be more at risk for certain types of cancers if you take this medicine. Do not become pregnant while taking this medicine or for a while after stopping it. Women should inform their doctor if they wish to become pregnant or think they might be pregnant. Men should not father a child while taking this medicine and for a while after stopping it. There is a potential for serious side effects to an unborn child. Talk to your health care professional or pharmacist for more information. Do not breast-feed an infant while taking this medicine. This medicine has caused ovarian failure in some women. This medicine may interfere with the ability to have a child. This medicine has caused reduced sperm counts in some men. This may interfere with the ability to father a child. You should talk with your doctor or health care professional if you are concerned about your fertility. What side effects may I notice from receiving this medicine? Side effects that you should report to your doctor or health care professional as soon as possible:  allergic reactions like skin rash, itching or hives, swelling of the face, lips, or tongue  breathing problems  blurred vision  diarrhea  dizziness  fast heartbeat  low blood counts - this medicine may decrease the number of white blood cells, red blood cells and platelets. You may be at increased risk for infections and bleeding.  missed menstrual periods  mouth sores  nausea, vomiting  signs of infection - fever or chills, cough, sore throat, pain or difficulty passing urine  signs of decreased platelets or bleeding - bruising, pinpoint red spots on  the skin, black, tarry stools, blood in the urine  signs of decreased red blood cells - unusually weak or tired, fainting spells, lightheadedness  signs and symptoms of liver injury like dark yellow or brown urine; general ill feeling or flu-like symptoms; light-colored stools; loss of appetite; nausea; right upper belly pain; unusually weak or tired; yellowing of the eyes or skin  weight loss Side effects that usually do not require medical attention (report to your doctor or health care professional if they continue or are bothersome):  constipation  hair loss  tiredness This list may not describe all possible side effects. Call your doctor for medical advice about side effects. You may report side effects to FDA at 1-800-FDA-1088. Where should I keep my medicine? This drug is given in a hospital or clinic and will not be stored at home. NOTE: This sheet is a summary. It may not cover all possible information. If you have questions about this medicine, talk to your doctor, pharmacist, or health care provider.  2020 Elsevier/Gold Standard (2014-06-23 11:06:09)   

## 2019-11-06 DIAGNOSIS — R87610 Atypical squamous cells of undetermined significance on cytologic smear of cervix (ASC-US): Secondary | ICD-10-CM | POA: Diagnosis not present

## 2019-11-06 LAB — KAPPA/LAMBDA LIGHT CHAINS
Kappa free light chain: 3.3 mg/L (ref 3.3–19.4)
Kappa, lambda light chain ratio: 0.02 — ABNORMAL LOW (ref 0.26–1.65)
Lambda free light chains: 181 mg/L — ABNORMAL HIGH (ref 5.7–26.3)

## 2019-11-06 LAB — IGG, IGA, IGM
IgA: 22 mg/dL — ABNORMAL LOW (ref 87–352)
IgG (Immunoglobin G), Serum: 256 mg/dL — ABNORMAL LOW (ref 586–1602)
IgM (Immunoglobulin M), Srm: <5 mg/dL — ABNORMAL LOW (ref 26–217)

## 2019-11-07 LAB — PROTEIN ELECTROPHORESIS, SERUM
A/G Ratio: 2 — ABNORMAL HIGH (ref 0.7–1.7)
Albumin ELP: 3.9 g/dL (ref 2.9–4.4)
Alpha-1-Globulin: 0.2 g/dL (ref 0.0–0.4)
Alpha-2-Globulin: 0.8 g/dL (ref 0.4–1.0)
Beta Globulin: 0.9 g/dL (ref 0.7–1.3)
Gamma Globulin: 0.2 g/dL — ABNORMAL LOW (ref 0.4–1.8)
Globulin, Total: 2 g/dL — ABNORMAL LOW (ref 2.2–3.9)
Total Protein ELP: 5.9 g/dL — ABNORMAL LOW (ref 6.0–8.5)

## 2019-11-08 LAB — CYTOLOGY - PAP
Comment: NEGATIVE
Diagnosis: UNDETERMINED — AB
High risk HPV: POSITIVE — AB

## 2019-11-10 ENCOUNTER — Other Ambulatory Visit: Payer: Self-pay

## 2019-11-10 ENCOUNTER — Other Ambulatory Visit: Payer: Self-pay | Admitting: Internal Medicine

## 2019-11-10 ENCOUNTER — Ambulatory Visit
Admission: RE | Admit: 2019-11-10 | Discharge: 2019-11-10 | Disposition: A | Discharge status: Home / Self Care | Payer: Medicare Other | Source: Ambulatory Visit | Attending: Internal Medicine | Admitting: Internal Medicine

## 2019-11-10 DIAGNOSIS — Z1231 Encounter for screening mammogram for malignant neoplasm of breast: Secondary | ICD-10-CM

## 2019-11-11 ENCOUNTER — Telehealth: Payer: Self-pay

## 2019-11-11 ENCOUNTER — Ambulatory Visit: Payer: Medicare Other | Attending: Internal Medicine

## 2019-11-11 DIAGNOSIS — Z23 Encounter for immunization: Secondary | ICD-10-CM

## 2019-11-11 NOTE — Progress Notes (Signed)
Covid-19 Vaccination Clinic  Name:  Emily Greer    MRN: [id number] DOB: 12/05/1950  11/11/2019  [name] was observed post Covid-19 immunization for 15 minutes without incident. She was provided with Vaccine Information Sheet and instruction to access the V-Safe system.   [name] was instructed to call 911 with any severe reactions post vaccine: [name] Kitchen Difficulty breathing  . Swelling of face and throat  . A fast heartbeat  . A bad rash all over body  . Dizziness and weakness

## 2019-11-11 NOTE — Telephone Encounter (Signed)
Called pt to discuss Pap smear results and to schedule a Colposcopy. Pt made aware that her Pap smear was positive for HPV and ASCUS. Pt Colposcopy is scheduled for 01/05/20.  Understanding was voiced.  Malerie Eakins l Kade Rickels, CMA

## 2019-11-12 ENCOUNTER — Telehealth: Payer: Medicare Other

## 2019-11-13 ENCOUNTER — Ambulatory Visit: Payer: Medicare Other

## 2019-11-13 ENCOUNTER — Ambulatory Visit: Payer: Medicare Other | Admitting: Family

## 2019-11-13 ENCOUNTER — Other Ambulatory Visit: Payer: Medicare Other

## 2019-12-03 ENCOUNTER — Other Ambulatory Visit: Payer: Medicare Other

## 2019-12-03 ENCOUNTER — Ambulatory Visit: Payer: Medicare Other | Admitting: Hematology & Oncology

## 2019-12-03 ENCOUNTER — Ambulatory Visit: Payer: Medicare Other

## 2019-12-09 ENCOUNTER — Ambulatory Visit: Payer: Medicare Other

## 2019-12-11 ENCOUNTER — Inpatient Hospital Stay: Payer: Medicare Other

## 2019-12-11 ENCOUNTER — Inpatient Hospital Stay: Payer: Medicare Other | Attending: Hematology & Oncology

## 2019-12-11 ENCOUNTER — Other Ambulatory Visit: Payer: Self-pay

## 2019-12-11 ENCOUNTER — Encounter: Payer: Self-pay | Admitting: Hematology & Oncology

## 2019-12-11 ENCOUNTER — Inpatient Hospital Stay (HOSPITAL_BASED_OUTPATIENT_CLINIC_OR_DEPARTMENT_OTHER): Payer: Medicare Other | Admitting: Hematology & Oncology

## 2019-12-11 VITALS — BP 141/80 | HR 84 | Temp 98.5°F | Resp 18 | Wt 183.0 lb

## 2019-12-11 DIAGNOSIS — Z8542 Personal history of malignant neoplasm of other parts of uterus: Secondary | ICD-10-CM | POA: Diagnosis not present

## 2019-12-11 DIAGNOSIS — D509 Iron deficiency anemia, unspecified: Secondary | ICD-10-CM | POA: Insufficient documentation

## 2019-12-11 DIAGNOSIS — C9000 Multiple myeloma not having achieved remission: Secondary | ICD-10-CM

## 2019-12-11 DIAGNOSIS — Z885 Allergy status to narcotic agent status: Secondary | ICD-10-CM | POA: Diagnosis not present

## 2019-12-11 DIAGNOSIS — C541 Malignant neoplasm of endometrium: Secondary | ICD-10-CM

## 2019-12-11 DIAGNOSIS — Z923 Personal history of irradiation: Secondary | ICD-10-CM | POA: Diagnosis not present

## 2019-12-11 DIAGNOSIS — C9001 Multiple myeloma in remission: Secondary | ICD-10-CM

## 2019-12-11 DIAGNOSIS — Z79899 Other long term (current) drug therapy: Secondary | ICD-10-CM | POA: Insufficient documentation

## 2019-12-11 LAB — CBC WITH DIFFERENTIAL (CANCER CENTER ONLY)
Abs Immature Granulocytes: 0.02 10*3/uL (ref 0.00–0.07)
Basophils Absolute: 0 10*3/uL (ref 0.0–0.1)
Basophils Relative: 1 %
Eosinophils Absolute: 0.1 10*3/uL (ref 0.0–0.5)
Eosinophils Relative: 2 %
HCT: 27.6 % — ABNORMAL LOW (ref 36.0–46.0)
Hemoglobin: 9 g/dL — ABNORMAL LOW (ref 12.0–15.0)
Immature Granulocytes: 1 %
Lymphocytes Relative: 15 %
Lymphs Abs: 0.3 10*3/uL — ABNORMAL LOW (ref 0.7–4.0)
MCH: 28.8 pg (ref 26.0–34.0)
MCHC: 32.6 g/dL (ref 30.0–36.0)
MCV: 88.2 fL (ref 80.0–100.0)
Monocytes Absolute: 0.4 10*3/uL (ref 0.1–1.0)
Monocytes Relative: 17 %
Neutro Abs: 1.3 10*3/uL — ABNORMAL LOW (ref 1.7–7.7)
Neutrophils Relative %: 64 %
Platelet Count: 63 10*3/uL — ABNORMAL LOW (ref 150–400)
RBC: 3.13 MIL/uL — ABNORMAL LOW (ref 3.87–5.11)
RDW: 17.2 % — ABNORMAL HIGH (ref 11.5–15.5)
WBC Count: 2.1 10*3/uL — ABNORMAL LOW (ref 4.0–10.5)
nRBC: 0 % (ref 0.0–0.2)

## 2019-12-11 LAB — CMP (CANCER CENTER ONLY)
ALT: 10 U/L (ref 0–44)
AST: 10 U/L — ABNORMAL LOW (ref 15–41)
Albumin: 4.2 g/dL (ref 3.5–5.0)
Alkaline Phosphatase: 33 U/L — ABNORMAL LOW (ref 38–126)
Anion gap: 9 (ref 5–15)
BUN: 16 mg/dL (ref 8–23)
CO2: 24 mmol/L (ref 22–32)
Calcium: 9.8 mg/dL (ref 8.9–10.3)
Chloride: 109 mmol/L (ref 98–111)
Creatinine: 0.85 mg/dL (ref 0.44–1.00)
GFR, Estimated: >60 mL/min (ref >60)
Glucose, Bld: 91 mg/dL (ref 70–99)
Potassium: 3.4 mmol/L — ABNORMAL LOW (ref 3.5–5.1)
Sodium: 142 mmol/L (ref 135–145)
Total Bilirubin: 0.4 mg/dL (ref 0.3–1.2)
Total Protein: 6.2 g/dL — ABNORMAL LOW (ref 6.5–8.1)

## 2019-12-11 LAB — LACTATE DEHYDROGENASE: LDH: 172 U/L (ref 98–192)

## 2019-12-11 MED ORDER — HEPARIN SOD (PORK) LOCK FLUSH 100 UNIT/ML IV SOLN
500.0000 [IU] | Freq: Once | INTRAVENOUS | Status: AC | PRN
  Administered 2019-12-11: 500 [IU]
  Filled 2019-12-11: qty 5

## 2019-12-11 MED ORDER — SODIUM CHLORIDE 0.9% FLUSH
10.0000 mL | INTRAVENOUS | Status: DC | PRN
  Administered 2019-12-11: 10 mL
  Filled 2019-12-11: qty 10

## 2019-12-11 NOTE — Addendum Note (Signed)
Addended by: Johny Drilling on: 12/11/2019 02:16 PM   Modules accepted: Orders

## 2019-12-11 NOTE — Progress Notes (Signed)
Hematology and Oncology Follow Up Visit  Emily Greer [id number] August 30, 1950 69 y.o. 12/11/2019   Principle Diagnosis:  Recurrent lambda light chain myeloma - nl cytogenetics History of recurrent endometrial carcinoma Iron deficiency anemia-blood loss  PastTherapy: Status post second autologous stem cell transplant on 07/24/2014 Maintenance therapy with Pomalidomide/every 2 week Velcade - d/c'ed Radiation therapy for endometrial recurrence - completed 04/20/2015 Pomalyst/Kyprolis 70mg /m2 IV q 2 weeks - s/p cycle #12 - held on 12/26/2017 for hematuria Daratumumab/Pomalyst (1 mg) - status post cycle19 -- d/c on 08/21/2019  Current Therapy: Melflufen 40 mg IV q 4 weeks -- started on 08/27/2019, s/p cycle #2 --  D/c due to FDA removal Selinexor 80 mg po q week -- start on 12/25/2019 Femara 2.5 mg po q day Xgeva 120 mg subcu every 3 months - next dose in 01/2020 IV iron as indicated   Interim History:  Emily Greer is here today for follow-up and treatment.  Unfortunately, the FDA took Melflufen off the market.  As such, we are going to have to figure out what else we can use to treat her myeloma.  Her labs show that the lambda light chains come down a little bit.  We checked it this week week, the light chain was 17.1 mg/dL.  I think the next option would be Selinexor.  I think she would be able to tolerate this.  I would like to hope that she will have a response to it.  I think we can probably utilize single agent Selinexor.  I know that we could combine it with Velcade or Kyprolis.  She has not been on Kyprolis for about 2 years.  Has been even longer that she was on Velcade.  As such, I would think that she should get a response with either of these added to the Selinexor.  She is doing okay.  She has had no fever.  She has had no bleeding.  She has had no problems with cough or shortness of breath.  Her husband is doing okay.  He had a CVA over the  summer and is recovering from this.  She is taking care of her mom who is in her 90s.  Her mother is living with her.   So far, she been doing pretty well.  She had no problems with the Me Hopefully, her children will be able to come in for Thanksgiving.  Overall, I would have to say that her performance status is probably ECOG 1.  Medications:  Allergies as of 12/11/2019      Reactions   Codeine Nausea Only      Medication List       Accurate as of December 11, 2019  1:40 PM. If you have any questions, ask your nurse or doctor.        acyclovir 400 MG tablet Commonly known as: ZOVIRAX TAKE 1 TABLET(400 MG) BY MOUTH DAILY   albuterol 108 (90 Base) MCG/ACT inhaler Commonly known as: VENTOLIN HFA Inhale 2 puffs into the lungs every 6 (six) hours as needed for wheezing or shortness of breath. 2 puffs 3 times daily x 5 days then every 6 hours as needed.   amLODipine 10 MG tablet Commonly known as: NORVASC Take 1 tablet (10 mg total) by mouth every morning.   aspirin EC 81 MG tablet Take 81 mg by mouth 2 (two) times daily.   CALCIUM 1200+D3 PO Take 1 tablet by mouth daily.   daratumumab 400 MG/20ML Commonly known as: DARZALEX Inject  into the vein.   hydrochlorothiazide 12.5 MG capsule Commonly known as: MICROZIDE Take 12.5 mg by mouth every morning.   letrozole 2.5 MG tablet Commonly known as: FEMARA Take 1 tablet (2.5 mg total) by mouth daily.   lidocaine-prilocaine cream Commonly known as: EMLA Apply 1 application topically as needed.   loperamide 2 MG capsule Commonly known as: IMODIUM Take by mouth as needed for diarrhea or loose stools. Reported on 08/19/2015   loratadine 10 MG tablet Commonly known as: CLARITIN Take 10 mg by mouth every morning. Reported on 08/19/2015   LORazepam 0.5 MG tablet Commonly known as: ATIVAN Take by mouth.   metoprolol succinate 50 MG 24 hr tablet Commonly known as: TOPROL-XL Take 1 tablet (50 mg total) by mouth every  morning.   montelukast 10 MG tablet Commonly known as: SINGULAIR TAKE 1 TABLET(10 MG) BY MOUTH AT BEDTIME   Nasacort Allergy 24HR 55 MCG/ACT Aero nasal inhaler Generic drug: triamcinolone Place 2 sprays into the nose as needed.   ondansetron 8 MG tablet Commonly known as: Zofran Take 1 tablet (8 mg total) by mouth 2 (two) times daily as needed (Nausea or vomiting).   potassium chloride SA 20 MEQ tablet Commonly known as: KLOR-CON TAKE 1 TABLET(20 MEQ) BY MOUTH TWICE DAILY   PROBIOTIC DAILY PO Take by mouth daily.   prochlorperazine 10 MG tablet Commonly known as: COMPAZINE Take 1 tablet (10 mg total) by mouth every 6 (six) hours as needed (Nausea or vomiting).   triamterene-hydrochlorothiazide 37.5-25 MG tablet Commonly known as: MAXZIDE-25 1 tablet by mouth at bedtime   Vitamin D3 50 MCG (2000 UT) Tabs Take 2 tablets by mouth daily.       Allergies:  Allergies  Allergen Reactions  . Codeine Nausea Only    Past Medical History, Surgical history, Social history, and Family History were reviewed and updated.  Review of Systems: Review of Systems  Constitutional: Negative.   HENT: Negative.   Eyes: Negative.   Respiratory: Negative.   Cardiovascular: Negative.   Gastrointestinal: Negative.   Genitourinary: Negative.   Musculoskeletal: Negative.   Skin: Negative.   Neurological: Negative.   Endo/Heme/Allergies: Negative.   Psychiatric/Behavioral: Negative.       Physical Exam:  weight is 183 lb (83 kg). Her oral temperature is 98.5 F (36.9 C). Her blood pressure is 141/80 (abnormal) and her pulse is 84. Her respiration is 18 and oxygen saturation is 98%.   Wt Readings from Last 3 Encounters:  12/11/19 183 lb (83 kg)  11/05/19 191 lb (86.6 kg)  11/05/19 189 lb (85.7 kg)    Physical Exam Vitals reviewed.  HENT:     Head: Normocephalic and atraumatic.  Eyes:     Pupils: Pupils are equal, round, and reactive to light.  Cardiovascular:     Rate  and Rhythm: Normal rate and regular rhythm.     Heart sounds: Normal heart sounds.  Pulmonary:     Effort: Pulmonary effort is normal.     Breath sounds: Normal breath sounds.  Abdominal:     General: Bowel sounds are normal.     Palpations: Abdomen is soft.  Musculoskeletal:        General: No tenderness or deformity. Normal range of motion.     Cervical back: Normal range of motion.  Lymphadenopathy:     Cervical: No cervical adenopathy.  Skin:    General: Skin is warm and dry.     Findings: No erythema or rash.  Neurological:  Mental Status: She is alert and oriented to person, place, and time.  Psychiatric:        Behavior: Behavior normal.        Thought Content: Thought content normal.        Judgment: Judgment normal.      Lab Results  Component Value Date   WBC 2.1 (L) 12/11/2019   HGB 9.0 (L) 12/11/2019   HCT 27.6 (L) 12/11/2019   MCV 88.2 12/11/2019   PLT 63 (L) 12/11/2019   Lab Results  Component Value Date   FERRITIN 2,550 (H) 11/05/2019   IRON 110 11/05/2019   TIBC 328 11/05/2019   UIBC 218 11/05/2019   IRONPCTSAT 33 11/05/2019   Lab Results  Component Value Date   RETICCTPCT 1.6 11/05/2019   RBC 3.13 (L) 12/11/2019   Lab Results  Component Value Date   KPAFRELGTCHN 3.3 11/05/2019   LAMBDASER 181.0 (H) 11/05/2019   KAPLAMBRATIO 0.02 (L) 11/05/2019   Lab Results  Component Value Date   IGGSERUM 256 (L) 11/05/2019   IGA 22 (L) 11/05/2019   IGMSERUM <5 (L) 11/05/2019   Lab Results  Component Value Date   TOTALPROTELP 5.9 (L) 11/05/2019   ALBUMINELP 3.9 11/05/2019   A1GS 0.2 11/05/2019   A2GS 0.8 11/05/2019   BETS 0.9 11/05/2019   BETA2SER 0.4 11/23/2014   GAMS 0.2 (L) 11/05/2019   MSPIKE Not Observed 11/05/2019   SPEI Comment 11/05/2019     Chemistry      Component Value Date/Time   NA 142 12/11/2019 1216   NA 141 01/10/2017 1115   NA 140 06/21/2016 0918   K 3.4 (L) 12/11/2019 1216   K 4.0 01/10/2017 1115   K 4.3 06/21/2016  0918   CL 109 12/11/2019 1216   CL 106 01/10/2017 1115   CO2 24 12/11/2019 1216   CO2 27 01/10/2017 1115   CO2 20 (L) 06/21/2016 0918   BUN 16 12/11/2019 1216   BUN 15 01/10/2017 1115   BUN 15.8 06/21/2016 0918   CREATININE 0.85 12/11/2019 1216   CREATININE 1.0 01/10/2017 1115   CREATININE 0.8 06/21/2016 0918      Component Value Date/Time   CALCIUM 9.8 12/11/2019 1216   CALCIUM 9.5 01/10/2017 1115   CALCIUM 9.4 06/21/2016 0918   ALKPHOS 33 (L) 12/11/2019 1216   ALKPHOS 40 01/10/2017 1115   ALKPHOS 66 06/21/2016 0918   AST 10 (L) 12/11/2019 1216   AST 17 06/21/2016 0918   ALT 10 12/11/2019 1216   ALT 19 01/10/2017 1115   ALT 37 06/21/2016 0918   BILITOT 0.4 12/11/2019 1216   BILITOT 0.32 06/21/2016 0918       Impression and Plan: Ms. Schiffman is a very pleasant 69 yo African American female with recurrent lambda light chain myeloma. Her second stem cell transplant for light chain myeloma was in June 2016.   Shealsohaslocalized recurrent endometrial cancer (followed by gyn onc Dr. Nelly Rout) and is currently on Femara.   Hopefully, we will have a response with the Selinexor.  Again this is a new medicine for her.  We will just try single agent Selinexor.  We will have her on 80 mg a week.  We will just have to watch out with diarrhea.  I would like to have her come back to see [place] in about a month.  By then, she would be on the Selinexor for a couple weeks or so.  Again, we can add Velcade or Kyprolis to the Selinexor if she  does not get a good response with single agent therapy.  I would like to see her back in a month.    Josph Macho, MD 11/11/20211:40 PM

## 2019-12-11 NOTE — Patient Instructions (Signed)
Implanted Port Insertion, Care After °This sheet gives you information about how to care for yourself after your procedure. Your health care provider may also give you more specific instructions. If you have problems or questions, contact your health care provider. °What can I expect after the procedure? °After the procedure, it is common to have: °· Discomfort at the port insertion site. °· Bruising on the skin over the port. This should improve over 3-4 days. °Follow these instructions at home: °Port care °· After your port is placed, you will get a manufacturer's information card. The card has information about your port. Keep this card with you at all times. °· Take care of the port as told by your health care provider. Ask your health care provider if you or a family member can get training for taking care of the port at home. A home health care nurse may also take care of the port. °· Make sure to remember what type of port you have. °Incision care ° °  ° °· Follow instructions from your health care provider about how to take care of your port insertion site. Make sure you: °? Wash your hands with soap and water before and after you change your bandage (dressing). If soap and water are not available, use hand sanitizer. °? Change your dressing as told by your health care provider. °? Leave stitches (sutures), skin glue, or adhesive strips in place. These skin closures may need to stay in place for 2 weeks or longer. If adhesive strip edges start to loosen and curl up, you may trim the loose edges. Do not remove adhesive strips completely unless your health care provider tells you to do that. °· Check your port insertion site every day for signs of infection. Check for: °? Redness, swelling, or pain. °? Fluid or blood. °? Warmth. °? Pus or a bad smell. °Activity °· Return to your normal activities as told by your health care provider. Ask your health care provider what activities are safe for you. °· Do not  lift anything that is heavier than 10 lb (4.5 kg), or the limit that you are told, until your health care provider says that it is safe. °General instructions °· Take over-the-counter and prescription medicines only as told by your health care provider. °· Do not take baths, swim, or use a hot tub until your health care provider approves. Ask your health care provider if you may take showers. You may only be allowed to take sponge baths. °· Do not drive for 24 hours if you were given a sedative during your procedure. °· Wear a medical alert bracelet in case of an emergency. This will tell any health care providers that you have a port. °· Keep all follow-up visits as told by your health care provider. This is important. °Contact a health care provider if: °· You cannot flush your port with saline as directed, or you cannot draw blood from the port. °· You have a fever or chills. °· You have redness, swelling, or pain around your port insertion site. °· You have fluid or blood coming from your port insertion site. °· Your port insertion site feels warm to the touch. °· You have pus or a bad smell coming from the port insertion site. °Get help right away if: °· You have chest pain or shortness of breath. °· You have bleeding from your port that you cannot control. °Summary °· Take care of the port as told by your health   care provider. Keep the manufacturer's information card with you at all times. °· Change your dressing as told by your health care provider. °· Contact a health care provider if you have a fever or chills or if you have redness, swelling, or pain around your port insertion site. °· Keep all follow-up visits as told by your health care provider. °This information is not intended to replace advice given to you by your health care provider. Make sure you discuss any questions you have with your health care provider. °Document Revised: 08/14/2017 Document Reviewed: 08/14/2017 °Elsevier Patient Education ©  2020 [employer] ° °

## 2019-12-12 ENCOUNTER — Telehealth: Payer: Self-pay

## 2019-12-12 LAB — PROTEIN ELECTROPHORESIS, SERUM, WITH REFLEX
A/G Ratio: 1.5 (ref 0.7–1.7)
Albumin ELP: 3.5 g/dL (ref 2.9–4.4)
Alpha-1-Globulin: 0.2 g/dL (ref 0.0–0.4)
Alpha-2-Globulin: 0.9 g/dL (ref 0.4–1.0)
Beta Globulin: 1 g/dL (ref 0.7–1.3)
Gamma Globulin: 0.3 g/dL — ABNORMAL LOW (ref 0.4–1.8)
Globulin, Total: 2.4 g/dL (ref 2.2–3.9)
Total Protein ELP: 5.9 g/dL — ABNORMAL LOW (ref 6.0–8.5)

## 2019-12-12 LAB — IGG, IGA, IGM
IgA: 30 mg/dL — ABNORMAL LOW (ref 87–352)
IgG (Immunoglobin G), Serum: 338 mg/dL — ABNORMAL LOW (ref 586–1602)
IgM (Immunoglobulin M), Srm: 5 mg/dL — ABNORMAL LOW (ref 26–217)

## 2019-12-12 LAB — KAPPA/LAMBDA LIGHT CHAINS
Kappa free light chain: 3.4 mg/L (ref 3.3–19.4)
Kappa, lambda light chain ratio: 0.02 — ABNORMAL LOW (ref 0.26–1.65)
Lambda free light chains: 171.4 mg/L — ABNORMAL HIGH (ref 5.7–26.3)

## 2019-12-12 NOTE — Telephone Encounter (Signed)
No LOS listed from 12/11/19 appt... AOM

## 2019-12-15 ENCOUNTER — Telehealth: Payer: Medicare Other

## 2019-12-16 ENCOUNTER — Telehealth: Payer: Self-pay | Admitting: *Deleted

## 2019-12-16 NOTE — Telephone Encounter (Signed)
Call received from patient stating that she had a fall on 12/12/19 without injury and wanted Dr. Marin Olp to know.  Pt states that she is sore on her right side, but has no other pain.  Dr. Marin Olp notified. No orders received at this time.

## 2019-12-19 ENCOUNTER — Telehealth: Payer: Self-pay | Admitting: Pharmacy Technician

## 2019-12-19 ENCOUNTER — Telehealth: Payer: Self-pay | Admitting: Pharmacist

## 2019-12-19 MED ORDER — SELINEXOR (80 MG ONCE WEEKLY) 40 MG PO TBPK: 80.0000 mg | ORAL_TABLET | ORAL | 3 refills | Status: DC

## 2019-12-19 NOTE — Telephone Encounter (Signed)
Oral Oncology Pharmacist Encounter  Received new prescription for Xpovio (selinexor) for the treatment of recurrent lambda light chain myeloma, planned duration until disease progression or unacceptable drug toxicity.  CMP/CBC from 12/11/19 assessed, no relevant lab abnormalities. Prescription dose and frequency assessed.   Current medication list in Epic reviewed, no relevant DDIs with selinexor identified.  Evaluated chart and no patient barriers to medication adherence identified.   Prescription has been e-scribed to [place] for benefits analysis and approval.  Oral Oncology Clinic will continue to follow for insurance authorization, copayment issues, initial counseling and start date.  Darl Pikes, PharmD, BCPS, BCOP, CPP Hematology/Oncology Clinical Pharmacist Practitioner [facility]/HP/AP [facility] [phone]  12/19/2019 12:15 PM

## 2019-12-19 NOTE — Telephone Encounter (Signed)
Oral Oncology Patient Advocate Encounter   Received notification from [facility] that prior authorization for Xpovio is required.   PA submitted on CoverMyMeds (12/19/19) [place] Status is pending   Oral Oncology Clinic will continue to follow.  [place] Patient [place] Phone [phone] Fax [phone] 12/22/2019 8:42 AM

## 2019-12-22 ENCOUNTER — Telehealth: Payer: Self-pay

## 2019-12-22 ENCOUNTER — Telehealth (INDEPENDENT_AMBULATORY_CARE_PROVIDER_SITE_OTHER): Payer: Medicare Other | Admitting: Internal Medicine

## 2019-12-22 ENCOUNTER — Other Ambulatory Visit: Payer: Self-pay

## 2019-12-22 ENCOUNTER — Encounter: Payer: Self-pay | Admitting: Internal Medicine

## 2019-12-22 VITALS — BP 118/76 | HR 106 | Temp 99.0°F | Ht 62.0 in

## 2019-12-22 DIAGNOSIS — J069 Acute upper respiratory infection, unspecified: Secondary | ICD-10-CM

## 2019-12-22 LAB — POC INFLUENZA A&B (BINAX/QUICKVUE)
Influenza A, POC: NEGATIVE
Influenza B, POC: NEGATIVE

## 2019-12-22 LAB — POC COVID19 BINAXNOW: SARS Coronavirus 2 Ag: NEGATIVE

## 2019-12-22 MED ORDER — AZITHROMYCIN 250 MG PO TABS: ORAL_TABLET | ORAL | 0 refills | Status: AC

## 2019-12-22 NOTE — Telephone Encounter (Signed)
Oral Oncology Patient Advocate Encounter  Received notification from [place] that the request for prior authorization for Xpovio has been denied due to the patient not trying all formulary alternatives: Empliciti, SARCLISA, and Darzalex.  This encounter will continue to be updated until final determination.    [place] Patient [place] Phone [phone] Fax [phone] 12/22/2019 8:49 AM

## 2019-12-22 NOTE — Telephone Encounter (Signed)
The patient agreed to having a mychart visit for her cold symptoms.

## 2019-12-22 NOTE — Progress Notes (Signed)
This visit occurred during the SARS-CoV-2 public health emergency.  Safety protocols were in place, including screening questions prior to the visit, additional usage of staff PPE, and extensive cleaning of exam room while observing appropriate contact time as indicated for disinfecting solutions.  Subjective:     Patient ID: Emily Greer , female    DOB: [birth year] , 69 y.o.   MRN: [id number]   Chief Complaint  Patient presents with  . Nasal Congestion    HPI  She presents today for further evaluation of a cough and nasal congestion. She is concerned b/c COVID pandemic. She denies fever/chills. Cough has been persistent, non-productive. Admits she went to [facility] [employer] game, which was outside. She has also been out of town. Denies ill contacts. No relief with otc meds.     Past Medical History:  Diagnosis Date  . Avascular necrosis of femoral head (HCC)    bilateral per CT 07-26-2015  . Endometrial carcinoma [facility]) gyn oncologist-  dr Nelly Rout ([facility])/  radiation oncologist-- dr Roselind Messier   2013 dx  FIGO Stage 1A, Grade 2 endometrioid endometrial cancer s/p TAH w/ BSO and bilateral pelvic node dissection 10-31-2011 ;  recurrence at distal vagina 04/ 2014 s/p  brachytherapy (ended 07-29-2012);  2nd recurrence 12/ 2016  vaginal apex s/p  conformational radiotherapy 03-10-2015 to 04-20-2015  . Family history of adverse reaction to anesthesia    mother ponv  . GERD (gastroesophageal reflux disease)   . Goals of care, counseling/discussion 08/21/2019  . H/O stem cell transplant (HCC)    02/ 2000 and second one 06/ 2016  . 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 History of chemotherapy    last chemo 12-26-17  . History of radiation therapy 6/4, 6/11, 6/19, 6/25, 07/29/2012   vagina 30.5 gray in 5 fx, HDR brachytherapy:   last radiation to vagina 03-10-2015 to 04-20-2015  50.4gray  . History of radiation therapy 03/10/15-04/20/15   vagina  50.4 in 28 fractions  . Hypertension   . Lambda light chain myeloma [facility]) oncologist-  dr Myna Hidalgo ([facility])  and  [facility] -- dr Silvestre Mesi gasparetto   dx 07/ 1999 s/p  VAD chemotherapy 11/ 1999,  purged autotransplant 03-21-1998 followed by auto stem cell transplant 03-29-1998;  recurrance w/ second autologous stem cell transplant 07-24-2014;  in Re-mission currently , chemo maintenance therapy  . Osteoporosis 12/18/05   Increased  risk   . PONV (postoperative nausea and vomiting)   . Wears glasses      Family History  Problem Relation Age of Onset  . Colon cancer Mother   . Hypertension Father   . Heart Problems Father      Current Outpatient Medications:  .  acyclovir (ZOVIRAX) 400 MG tablet, TAKE 1 TABLET(400 MG) BY MOUTH DAILY, Disp: 30 tablet, Rfl: 3 .  albuterol (VENTOLIN HFA) 108 (90 Base) MCG/ACT inhaler, Inhale 2 puffs into the lungs every 6 (six) hours as needed for wheezing or shortness of breath. 2 puffs 3 times daily x 5 days then every 6 hours as needed., Disp: 18 g, Rfl: 11 .  amLODipine (NORVASC) 10 MG tablet, Take 1 tablet (10 mg total) by mouth every morning., Disp: 90 tablet, Rfl: 2 .  aspirin EC 81 MG tablet, Take 81 mg by mouth 2 (two) times daily. , Disp: , Rfl:  .  Calcium-Magnesium-Vitamin D (CALCIUM 1200+D3 PO), Take 1 tablet by mouth daily., Disp: , Rfl:  .  Cholecalciferol (VITAMIN D3) 2000 UNITS TABS, Take 2 tablets by mouth daily. , Disp: , Rfl:  .  daratumumab (DARZALEX) 400 MG/20ML, Inject into the vein., Disp: , Rfl:  .  hydrochlorothiazide (MICROZIDE) 12.5 MG capsule, Take 12.5 mg by mouth every morning., Disp: , Rfl:  .  letrozole (FEMARA) 2.5 MG tablet, Take 1 tablet (2.5 mg total) by mouth daily., Disp: 60 tablet, Rfl: 4 .  lidocaine-prilocaine (EMLA) cream, Apply 1 application topically as needed., Disp: 30 g, Rfl: 3 .  loperamide (IMODIUM) 2 MG capsule, Take by mouth as needed for diarrhea or loose stools. Reported on 08/19/2015, Disp: , Rfl:   .  loratadine (CLARITIN) 10 MG tablet, Take 10 mg by mouth every morning. Reported on 08/19/2015, Disp: , Rfl:  .  LORazepam (ATIVAN) 0.5 MG tablet, Take by mouth., Disp: , Rfl:  .  metoprolol succinate (TOPROL-XL) 50 MG 24 hr tablet, Take 1 tablet (50 mg total) by mouth every morning., Disp: 90 tablet, Rfl: 3 .  montelukast (SINGULAIR) 10 MG tablet, TAKE 1 TABLET(10 MG) BY MOUTH AT BEDTIME, Disp: 90 tablet, Rfl: 2 .  ondansetron (ZOFRAN) 8 MG tablet, Take 1 tablet (8 mg total) by mouth 2 (two) times daily as needed (Nausea or vomiting)., Disp: 30 tablet, Rfl: 1 .  potassium chloride SA (KLOR-CON) 20 MEQ tablet, TAKE 1 TABLET(20 MEQ) BY MOUTH TWICE DAILY, Disp: 60 tablet, Rfl: 3 .  Probiotic Product (PROBIOTIC DAILY PO), Take by mouth daily., Disp: , Rfl:  .  prochlorperazine (COMPAZINE) 10 MG tablet, Take 1 tablet (10 mg total) by mouth every 6 (six) hours as needed (Nausea or vomiting)., Disp: 30 tablet, Rfl: 1 .  selinexor 40 MG TBPK, Take 80 mg by mouth once a week., Disp: 8 each, Rfl: 3 .  triamcinolone (NASACORT ALLERGY 24HR) 55 MCG/ACT AERO nasal inhaler, Place 2 sprays into the nose as needed. , Disp: , Rfl:  .  triamterene-hydrochlorothiazide (MAXZIDE-25) 37.5-25 MG tablet, 1 tablet by mouth at bedtime, Disp: 90 tablet, Rfl: 2 No current facility-administered medications for this visit.  Facility-Administered Medications Ordered in Other Visits:  .  0.9 %  sodium chloride infusion, , Intravenous, Once PRN, Ennever, Rose Phi, MD .  albuterol (PROVENTIL) (2.5 MG/3ML) 0.083% nebulizer solution 2.5 mg, 2.5 mg, Nebulization, Once PRN, Ennever, Rose Phi, MD .  sodium chloride flush (NS) 0.9 % injection 10 mL, 10 mL, Intravenous, PRN, [place], Sarah M, NP, 10 mL at 12/26/17 1130 .  sodium chloride flush (NS) 0.9 % injection 10 mL, 10 mL, Intravenous, PRN, Josph Macho, MD, 10 mL at 04/24/19 1305 .  sodium chloride flush (NS) 0.9 % injection 10 mL, 10 mL, Intravenous, PRN, Josph Macho,  MD, 10 mL at 08/21/19 1233 .  sodium chloride flush (NS) 0.9 % injection 10 mL, 10 mL, Intracatheter, PRN, [place], Sarah M, NP, 10 mL at 10/01/19 1053 .  sodium chloride flush (NS) 0.9 % injection 10 mL, 10 mL, Intracatheter, PRN, [place], Sarah M, NP, 10 mL at 12/11/19 1414   Allergies  Allergen Reactions  . Codeine Nausea Only     Review of Systems  Constitutional: Negative.   HENT: Positive for congestion.   Respiratory: Positive for cough.   Cardiovascular: Negative.   Gastrointestinal: Negative.   Neurological: Negative.   Psychiatric/Behavioral: Negative.      Today's Vitals   12/22/19 1551  BP: 118/76  Pulse: (!) 106  Temp: 99 F (37.2 C)  TempSrc: Oral  Height: 5\' 2"  (1.575  m)   Body mass index is 33.47 kg/m.   Objective:  Physical Exam Vitals and nursing note reviewed.  Constitutional:      Appearance: Normal appearance.  HENT:     Head: Normocephalic and atraumatic.  Cardiovascular:     Rate and Rhythm: Normal rate and regular rhythm.     Heart sounds: Normal heart sounds.  Pulmonary:     Effort: Pulmonary effort is normal.     Breath sounds: Normal breath sounds.  Musculoskeletal:     Cervical back: Normal range of motion.  Skin:    General: Skin is warm.  Neurological:     General: No focal deficit present.     Mental Status: She is alert.  Psychiatric:        Mood and Affect: Mood normal.        Behavior: Behavior normal.         Assessment And Plan:     1. URI with cough and congestion Comments: COVID test performed. Since her sx have lasted more than a week, she was given rx Zpak and advised to take full course of abx. She was also given samples of Norel AD to take twce daily prn for next 3-5 days. Also advised to avoid dairy for next week or two. Rapd COVID, flu a/b testing is negative. She is advised to take temp daily and notify me ASAP if her condition worsens.  - POC COVID-19 - Novel Coronavirus, NAA ([facility]) - POC Influenza  A&B(BINAX/QUICKVUE)     Patient was given opportunity to ask questions. Patient verbalized understanding of the plan and was able to repeat key elements of the plan. All questions were answered to their satisfaction.  Gwynneth Aliment, MD   I, Gwynneth Aliment, MD, have reviewed all documentation for this visit. The documentation on 12/29/19 for the exam, diagnosis, procedures, and orders are all accurate and complete.  THE PATIENT IS ENCOURAGED TO PRACTICE SOCIAL DISTANCING DUE TO THE COVID-19 PANDEMIC.

## 2019-12-23 LAB — SARS-COV-2, NAA 2 DAY TAT

## 2019-12-23 LAB — NOVEL CORONAVIRUS, NAA: SARS-CoV-2, NAA: NOT DETECTED

## 2019-12-24 ENCOUNTER — Telehealth: Payer: Self-pay | Admitting: Hematology & Oncology

## 2019-12-24 ENCOUNTER — Telehealth: Payer: Self-pay

## 2019-12-24 NOTE — Telephone Encounter (Signed)
The patient called and left a message that her cough is getting better and thank you.

## 2019-12-24 NOTE — Telephone Encounter (Signed)
Appointments scheduled calendar printed & mailed per 11/24 los 

## 2019-12-29 ENCOUNTER — Encounter: Payer: Self-pay | Admitting: *Deleted

## 2019-12-29 NOTE — Telephone Encounter (Signed)
Oral Oncology Patient Advocate Encounter  We have filed an urgent appeal for the prior authorization denial of Xpovio.   Appeal packet has been faxed to [phone].    The insurance company states that a 6 hour turn around time is to be expected for urgent redeterminations of denial.   This encounter will continue to be updated until final appeal determination.    [place] Patient [place] Phone [phone] Fax [phone] 12/29/2019 9:18 AM

## 2020-01-05 ENCOUNTER — Encounter: Payer: Medicare Other | Admitting: Obstetrics & Gynecology

## 2020-01-05 ENCOUNTER — Other Ambulatory Visit: Payer: Self-pay

## 2020-01-05 MED ORDER — CETIRIZINE HCL 10 MG PO TABS: 10.0000 mg | ORAL_TABLET | Freq: Every day | ORAL | 2 refills | Status: DC

## 2020-01-05 NOTE — Telephone Encounter (Signed)
Oral Oncology Patient Advocate Encounter  Received fax notification from [place] that the prior authorization denial is being upheld.  Patient's health plan will consider coverage when the patient tries and fails 3 of the alternative products: Empliciti, Sarclisa, and Darzalex.  We will apply for manufacturer assistance to help patient obtain access to the medication.  [place] Patient [place] Phone [phone] Fax [phone] 01/05/2020 2:27 PM

## 2020-01-06 ENCOUNTER — Telehealth: Payer: Self-pay | Admitting: Pharmacy Technician

## 2020-01-07 NOTE — Telephone Encounter (Signed)
Oral Oncology Patient Advocate Encounter  Patient stopped by the office on 00/1/64 to sign application for [employer] Patient Assistance in an effort to reduce patient's out of pocket expense for Xpovio to $0.    Application completed and faxed to [phone].   [employer] patient assistance phone number for follow up is [phone].   This encounter will be updated until final determination.   [place] Patient [place] Phone [phone] Fax [phone] 01/07/2020 4:13 PM

## 2020-01-07 NOTE — Telephone Encounter (Signed)
Oral Oncology Patient Advocate Encounter  Sasha from [facility] called about new application for patient.  She was doing initial intake and had some questions.  I faxed her patient med/allergy list, [place] denial, and 1st level appeal denial.  Sasha stated that [employer] requires a 2nd level appeal to proceed with application.  Aly[place] submitted a second level appeal to insurance on 01/06/20 and the denial was faxed in today, 01/07/20.  I faxed the 2nd level appeal decision to [place].  Encounter will be updated until final determination.  [place] Patient [place] Phone [phone] Fax [phone] 01/07/2020 4:18 PM

## 2020-01-13 ENCOUNTER — Ambulatory Visit: Payer: Self-pay

## 2020-01-13 ENCOUNTER — Other Ambulatory Visit: Payer: Self-pay

## 2020-01-13 ENCOUNTER — Telehealth: Payer: Medicare Other

## 2020-01-13 DIAGNOSIS — I10 Essential (primary) hypertension: Secondary | ICD-10-CM

## 2020-01-13 DIAGNOSIS — E6609 Other obesity due to excess calories: Secondary | ICD-10-CM

## 2020-01-13 DIAGNOSIS — C9000 Multiple myeloma not having achieved remission: Secondary | ICD-10-CM

## 2020-01-14 NOTE — Telephone Encounter (Signed)
Oral Oncology Patient Advocate Encounter  Received notification from [facility] Patient Assistance program that patient has been successfully enrolled into their program to receive Xpovio from the manufacturer at $0 out of pocket until 01/30/20.    I called and spoke with patient.  She knows we will have to re-apply for 2022.   Patient knows to call the office with questions or concerns.   Oral Oncology Clinic will continue to follow.  [place] Patient [place] Phone [phone] Fax [phone] 01/14/2020 3:09 PM

## 2020-01-19 ENCOUNTER — Telehealth: Payer: Self-pay | Admitting: Pharmacist

## 2020-01-19 NOTE — Patient Instructions (Signed)
Visit Information  Goals Addressed    Other   .  Infection Prevention or Managed        Timeframe:  Short-Term Goal Priority:  High Start Date:  01/13/20                           Expected End Date: 02/27/20  Follow up date: 02/25/20  Over the next 45 days, patient will:  - notify MD if upper respiratory symptoms worsen or persist - stay well hydrated  - get plenty of rest - eat a healthy diet - avoid being around others who may be ill                        .  Keep Pain Under Control-Cancer Treatment        Timeframe:  Long-Range Goal Priority:  High Start Date: 01/13/20                            Expected End Date: 04/12/20                     Follow Up Date 02/25/20   - call for prescription refill 3 days before needed - develop a personal pain management plan - plan exercise or activity when pain is best controlled - prioritize tasks for the day    Why is this important?    Day-to-day life can be hard when you have pain.   Even a small change in emotion or a physical problem can make pain better or worse.   Coping with pain depends on how the mind and body reacts to pain.   Pain medicine is just one piece of the treatment puzzle.   There are many tools to help manage pain. A combination of them can be used to best meet your needs.     Notes:     [name] Kitchen  Make and Keep All Appointments        Timeframe:  Long-Range Goal Priority:  High Start Date:  01/13/20                          Expected End Date:  04/12/20                     Follow Up Date 02/25/20   - ask family or friend for a ride - call to cancel if needed - keep a calendar with appointment dates    Why is this important?    Part of staying healthy is seeing the doctor for follow-up care.   If you forget your appointments, there are some things you can do to stay on track.    Notes:     [name] Kitchen  Manage Fatigue (Tiredness- Cancer Treatment)        Timeframe:  Long-Range Goal Priority:  High Start Date:  01/13/20                            Expected End Date: 04/12/20                    Follow Up Date 02/25/20   - eat healthy - get a least 8 hours of sleep at night - maintain healthy weight - take a warm shower or bath before bed -  use meditation or relaxation techniques    Why is this important?   Cancer treatment and its side effects can drain your energy. It can keep you from doing things you would like to do.  There are many things that you can do to manage fatigue.    Notes:     [name] Kitchen  Matintain My Quality of Life        Timeframe:  Long-Range Goal Priority:  High Start Date:  01/13/20                           Expected End Date:  02/29/20                   Follow Up Date 02/25/20   - discuss my treatment options with the doctor or nurse - make shared treatment decisions with doctor - name a health care proxy (decision maker)    Why is this important?    Having a long-term illness can be scary.   It can also be stressful for you and your caregiver.   These steps may help.    Notes:     .  Track and Manage My Blood Pressure-Hypertension        Timeframe:  Long-Range Goal Priority:  High Start Date:  01/13/20                           Expected End Date:  04/12/20                     Follow Up Date 02/25/20   - check blood pressure 3 times per week - write blood pressure results in a log or diary    Why is this important?    You won't feel high blood pressure, but it can still hurt your blood vessels.   High blood pressure can cause heart or kidney problems. It can also cause a stroke.   Making lifestyle changes like losing a [place] weight or eating less salt will help.   Checking your blood pressure at home and at different times of the day can help to control blood pressure.   If the doctor prescribes medicine remember to take it the way the doctor ordered.   Call the office if you cannot afford the medicine or if there are questions about it.     Notes:         The patient verbalized understanding of instructions, educational materials, and care plan provided today and declined offer to receive copy of patient instructions, educational materials, and care plan.   Telephone follow up appointment with care management team member scheduled for: 02/25/20  Lynne Logan, RN

## 2020-01-19 NOTE — Chronic Care Management (AMB) (Signed)
Chronic Care Management   Follow Up Note   01/13/2020 Name: Emily Greer MRN: [id number] DOB: Jun 04, 1950  Referred by: [name], MD Reason for referral : Chronic Care Management (RN CM FU Call )   Emily Greer is a 69 y.o. year old female who is a primary care patient of [name], MD. The CCM team was consulted for assistance with chronic disease management and care coordination needs.    Review of patient status, including review of consultants reports, relevant laboratory and other test results, and collaboration with appropriate care team members and the patient's provider was performed as part of comprehensive patient evaluation and provision of chronic care management services.    SDOH (Social Determinants of Health) assessments performed: Yes See Care Plan activities for detailed interventions related to SDOH)   Completed outbound CCM RN CM follow up call to patient for a care plan update.     Outpatient Encounter Medications as of 01/13/2020  Medication Sig  . acyclovir (ZOVIRAX) 400 MG tablet TAKE 1 TABLET(400 MG) BY MOUTH DAILY  . albuterol (VENTOLIN HFA) 108 (90 Base) MCG/ACT inhaler Inhale 2 puffs into the lungs every 6 (six) hours as needed for wheezing or shortness of breath. 2 puffs 3 times daily x 5 days then every 6 hours as needed.  [name] Kitchen amLODipine (NORVASC) 10 MG tablet Take 1 tablet (10 mg total) by mouth every morning.  [name] Kitchen aspirin EC 81 MG tablet Take 81 mg by mouth 2 (two) times daily.   . Calcium-Magnesium-Vitamin D (CALCIUM 1200+D3 PO) Take 1 tablet by mouth daily.  . cetirizine (ZYRTEC ALLERGY) 10 MG tablet Take 1 tablet (10 mg total) by mouth at bedtime.  . Cholecalciferol (VITAMIN D3) 2000 UNITS TABS Take 2 tablets by mouth daily.   . daratumumab (DARZALEX) 400 MG/20ML Inject into the vein.  . hydrochlorothiazide (MICROZIDE) 12.5 MG capsule Take 12.5 mg by mouth every morning.  [name] Kitchen letrozole (FEMARA) 2.5 MG tablet Take 1 tablet (2.5 mg total)  by mouth daily.  [name] Kitchen lidocaine-prilocaine (EMLA) cream Apply 1 application topically as needed.  . loperamide (IMODIUM) 2 MG capsule Take by mouth as needed for diarrhea or loose stools. Reported on 08/19/2015  . loratadine (CLARITIN) 10 MG tablet Take 10 mg by mouth every morning. Reported on 08/19/2015  . LORazepam (ATIVAN) 0.5 MG tablet Take by mouth.  . metoprolol succinate (TOPROL-XL) 50 MG 24 hr tablet Take 1 tablet (50 mg total) by mouth every morning.  . montelukast (SINGULAIR) 10 MG tablet TAKE 1 TABLET(10 MG) BY MOUTH AT BEDTIME  . ondansetron (ZOFRAN) 8 MG tablet Take 1 tablet (8 mg total) by mouth 2 (two) times daily as needed (Nausea or vomiting).  . potassium chloride SA (KLOR-CON) 20 MEQ tablet TAKE 1 TABLET(20 MEQ) BY MOUTH TWICE DAILY  . Probiotic Product (PROBIOTIC DAILY PO) Take by mouth daily.  . prochlorperazine (COMPAZINE) 10 MG tablet Take 1 tablet (10 mg total) by mouth every 6 (six) hours as needed (Nausea or vomiting).  [name] Kitchen selinexor 40 MG TBPK Take 80 mg by mouth once a week.  . triamcinolone (NASACORT ALLERGY 24HR) 55 MCG/ACT AERO nasal inhaler Place 2 sprays into the nose as needed.   . triamterene-hydrochlorothiazide (MAXZIDE-25) 37.5-25 MG tablet 1 tablet by mouth at bedtime   Facility-Administered Encounter Medications as of 01/13/2020  Medication  . 0.9 %  sodium chloride infusion  . albuterol (PROVENTIL) (2.5 MG/3ML) 0.083% nebulizer solution 2.5 mg  . sodium chloride flush (NS) 0.9 % injection  10 mL  . sodium chloride flush (NS) 0.9 % injection 10 mL  . sodium chloride flush (NS) 0.9 % injection 10 mL  . sodium chloride flush (NS) 0.9 % injection 10 mL  . sodium chloride flush (NS) 0.9 % injection 10 mL     Objective:  Lab Results  Component Value Date   HGBA1C 5.7 (H) 06/01/2014   HGBA1C 5.9 03/22/2007   Lab Results  Component Value Date   LDLCALC 164 (H) 03/22/2007   CREATININE 0.85 12/11/2019   BP Readings from Last 3 Encounters:  12/22/19 118/76   12/11/19 (!) 141/80  11/05/19 130/71    Goals Addressed     Patient Care Plan: General Plan of Care (Adult)    Problem Identified: Quality of Life (General Plan of Care)   Priority: High    Long-Range Goal: Quality of Life Maintained   Start Date: 01/13/2020  Expected End Date: 04/12/2020  This Visit's Progress: On track  Priority: High  Note:   Current Barriers:   Ineffective Self Health Maintenance  Currently UNABLE TO independently self manage needs related to chronic health conditions.   Caregiver for spouse and elderly mother   Knowledge Deficits related to short term plan for care coordination needs and long term plans for chronic disease management needs Nurse Case Manager Clinical Goal(s):   Over the next 90 days, patient will work with care management team to address care coordination and chronic disease management needs related to Disease Management  Educational Needs  Care Coordination  Medication Management and Education  Psychosocial Support  Caregiver Stress support   Interventions:   Assess patient's thoughts about quality of life, goals and expectations, and dissatisfaction or desire to improve.   Identify issues of primary importance such as mental health, illness, exercise tolerance, pain, sexual function and intimacy, cognitive change, social isolation, finances and relationships.    Promote access to services in the community to support independence such as support groups, home visiting programs, financial assistance, handicapped parking tags, durable medical equipment and emergency responder.  Patient Goals/Self Care Activities: Over the next 90 days, patient will:  - call for prescription refill 3 days before needed - develop a personal pain management plan - plan exercise or activity when pain is best controlled - prioritize tasks for the day - keep a calendar with appointment dates- discuss my treatment options with the doctor or nurse -  make shared treatment decisions with doctor - name a health care proxy (decision maker)  Follow Up Plan: Telephone follow up appointment with care management team member scheduled for: 02/25/20   Patient Care Plan: Cancer Treatment Phase (Adult)    Problem Identified: Fatigue   Priority: High    Long-Range Goal: Fatigue Managed   Start Date: 01/13/2020  Expected End Date: 04/12/2020  This Visit's Progress: On track  Priority: High  Note:   Current Barriers:   Ineffective Self Health Maintenance  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   Knowledge Deficits related to short term plan for care coordination needs and long term plans for chronic disease management needs Nurse Case Manager Clinical Goal(s):   Over the next 90 days, patient will work with care management team to address care coordination and chronic disease management needs related to Disease Management  Educational Needs  Care Coordination  Medication Management and Education  Psychosocial Support  Caregiver Stress support   Interventions:   Screen for  presence of fatigue using a validated scale, quantitative (1 to 10) or word scale (mild, moderate, severe) at initial visit, at regular intervals and following treatment.   Identify causative factors, such as chemotherapy or radiation therapy, onset, pattern, duration, change over time, alleviating and contributing factors, as well as degree of interference with daily function.   Encourage physical activity, as appropriate, both aerobic and strength-building; consider beginning with low-level activities and increase as conditioning and confidence improves.   Assess degree of interference with daily function, presence of treatment effects such as anemia, bone metastases, fever or infection, history or risk of falls and conditioning level.   Offer reassurance that  treatment-related fatigue is not necessarily an indicator of disease progression.  Patient Goals/Self Care Activities:  Over the next 90 days, patient will:  - eat healthy - get a least 8 hours of sleep at night - maintain healthy weight - take a warm shower or bath before bed - use meditation or relaxation techniques  Follow Up Plan: Telephone follow up appointment with care management team member scheduled for: 02/25/20   Problem Identified: Infection   Priority: High    Goal: Infection Prevented or Managed   Start Date: 01/13/2020  Expected End Date: 02/27/2020  This Visit's Progress: On track  Priority: High  Note:   Current Barriers:   Ineffective Self Health Maintenance  Caregiver for spouse and elderly mother   Currently UNABLE TO independently self manage needs related to chronic health conditions.   Knowledge Deficits related to short term plan for care coordination needs and long term plans for chronic disease management needs Nurse Case Manager Clinical Goal(s):   Over the next 45 days, patient will work with care management team to address care coordination and chronic disease management needs related to Disease Management  Educational Needs  Care Coordination  Medication Management and Education  Psychosocial Support  Caregiver Stress support   Interventions:  . Determined patient continues to have symptoms suggestive of sinus congestion, although symptoms have improved . Determined patient completed her full course of antibiotics exactly as prescribed . Assessed for symptoms such as fever, chills, cough, body aches, nausea, vomiting and or diarrhea . Assessed for appropriate hydration and nutritional intake  . Instructed to notify MD at next f/u visit if symptoms have not improved or resolved . Discussed next scheduled Oncology f/u appointment scheduled for 01/22/20 @12 :30PM Patient Goals/Self Care Activities:  Over the next 30 days, patient will:  -  notify MD if upper respiratory symptoms worsen or persist - stay well hydrated  - get plenty of rest - eat a healthy diet - avoid being around others who may be ill   Follow Up Plan: Telephone follow up appointment with care management team member scheduled for: 02/25/20    Patient Care Plan: Hypertension (Adult)    Problem Identified: Disease Progression (Hypertension)   Priority: High    Long-Range Goal: Disease Progression Prevented or Minimized   Start Date: 01/13/2020  Expected End Date: 04/12/2020  This Visit's Progress: On track  Priority: High  Note:   Objective:  . Last practice recorded BP readings:  BP Readings from Last 3 Encounters:  12/22/19 118/76  12/11/19 (!) 141/80  11/05/19 130/71 .   [name] Kitchen Most recent eGFR/CrCl:  Lab Results  Component Value Date   EGFR 86 (L) 06/21/2016 .    No components found for: CRCL Current Barriers:  [name] Kitchen Knowledge Deficits related to basic understanding of hypertension pathophysiology and self care management .  Caregiver for spouse and elderly mother  Case Manager Clinical Goal(s):  [name] Kitchen Over the next 90 days, patient will demonstrate improved adherence to prescribed treatment plan for hypertension as evidenced by taking all medications as prescribed, monitoring and recording blood pressure as directed, adhering to low sodium/DASH diet Interventions:  . Evaluation of current treatment plan related to hypertension self management and patient's adherence to plan as established by provider. . Provided education to patient re: stroke prevention, s/s of heart attack and stroke, DASH diet, complications of uncontrolled blood pressure . Reviewed medications with patient and discussed importance of compliance . Discussed plans with patient for ongoing care management follow up and provided patient with direct contact information for care management team . Advised patient, providing education and rationale, to monitor blood pressure daily and record,  calling PCP for findings outside established parameters.  Patient Goals/Self-Care Activities . Over the next 90 days, patient will:  - Self administers medications as prescribed Attends all scheduled provider appointments Calls provider office for new concerns, questions, or BP outside discussed parameters Checks BP and records as discussed Follows a low sodium diet/DASH diet - check blood pressure 3 times per week - write blood pressure results in a log or diary  Follow Up Plan: Telephone follow up appointment with care management team member scheduled for: 02/25/20     Plan:   Telephone follow up appointment with care management team member scheduled for: 02/25/20   Delsa Sale, RN, BSN, CCM Care Management Coordinator [facility] Care Management/[facility]  Direct Phone: [phone]

## 2020-01-19 NOTE — Telephone Encounter (Signed)
Spoke to [place] at [place] on 01/16/20 regarding conversation she had with Lorriane Shire and [place] at [facility].  Elmyra Ricks stated that the manufacturer thinks that if an appeal is made to the [employer] (Dept of Insurance) stating that the patient cannot take Dexamethasone because of her blood sugar and the other two preferred agents are too similar to Darzalex that the [employer] may overturn the insurance denial.  [address] Patient [place] Phone [phone] Fax [phone] 01/19/2020 9:23 AM

## 2020-01-19 NOTE — Telephone Encounter (Signed)
Oral Chemotherapy Pharmacist Encounter  Patient plans on taking her first dose today 01/19/20. Initiation was delayed due to insurance denial and manufacturer assistance requirements.  Patient Education I spoke with patient for overview of new oral chemotherapy medication: Xpovio (selinexor) for the treatment of recurrent lambda light chain myeloma, planned duration until disease progression or unacceptable drug toxicity.   Pt is doing well. Counseled patient on administration, dosing, side effects, monitoring, drug-food interactions, safe handling, storage, and disposal. Patient will take 80 mg (two 40mg  tablet) by mouth once a week..  Side effects include but not limited to: diarrhea, N/V, decreased wbc.   Emily Greer know to take her ondansetron 30-60mins prior to her weekly Xpovio dose.  Reviewed with patient importance of keeping a medication schedule and plan for any missed doses.  After discussion with patient no patient barriers to medication adherence identified.   Emily Greer voiced understanding and appreciation. All questions answered. Medication handout provided.  Provided patient with [facility] phone number. Patient knows to call the office with questions or concerns. Oral Chemotherapy [facility] will continue to follow.  Darl Pikes, PharmD, BCPS, BCOP, CPP Hematology/Oncology Clinical Pharmacist Practitioner [facility] [phone]  01/19/2020 12:12 PM

## 2020-01-21 ENCOUNTER — Other Ambulatory Visit: Payer: Self-pay | Admitting: *Deleted

## 2020-01-22 ENCOUNTER — Telehealth: Payer: Self-pay | Admitting: Pharmacy Technician

## 2020-01-22 ENCOUNTER — Telehealth: Payer: Self-pay | Admitting: Hematology & Oncology

## 2020-01-22 ENCOUNTER — Encounter: Payer: Self-pay | Admitting: Hematology & Oncology

## 2020-01-22 ENCOUNTER — Inpatient Hospital Stay: Payer: Medicare Other

## 2020-01-22 ENCOUNTER — Inpatient Hospital Stay (HOSPITAL_BASED_OUTPATIENT_CLINIC_OR_DEPARTMENT_OTHER): Payer: Medicare Other | Admitting: Hematology & Oncology

## 2020-01-22 ENCOUNTER — Inpatient Hospital Stay: Payer: Medicare Other | Attending: Hematology & Oncology

## 2020-01-22 ENCOUNTER — Other Ambulatory Visit: Payer: Self-pay

## 2020-01-22 VITALS — BP 117/70 | HR 86 | Temp 98.3°F | Resp 18 | Wt 176.0 lb

## 2020-01-22 DIAGNOSIS — C9000 Multiple myeloma not having achieved remission: Secondary | ICD-10-CM | POA: Insufficient documentation

## 2020-01-22 DIAGNOSIS — C541 Malignant neoplasm of endometrium: Secondary | ICD-10-CM | POA: Diagnosis not present

## 2020-01-22 DIAGNOSIS — Z885 Allergy status to narcotic agent status: Secondary | ICD-10-CM | POA: Diagnosis not present

## 2020-01-22 DIAGNOSIS — D500 Iron deficiency anemia secondary to blood loss (chronic): Secondary | ICD-10-CM | POA: Insufficient documentation

## 2020-01-22 DIAGNOSIS — C9002 Multiple myeloma in relapse: Secondary | ICD-10-CM | POA: Diagnosis not present

## 2020-01-22 DIAGNOSIS — R110 Nausea: Secondary | ICD-10-CM | POA: Diagnosis not present

## 2020-01-22 DIAGNOSIS — R197 Diarrhea, unspecified: Secondary | ICD-10-CM | POA: Insufficient documentation

## 2020-01-22 DIAGNOSIS — Z923 Personal history of irradiation: Secondary | ICD-10-CM | POA: Insufficient documentation

## 2020-01-22 DIAGNOSIS — Z95828 Presence of other vascular implants and grafts: Secondary | ICD-10-CM

## 2020-01-22 LAB — CBC WITH DIFFERENTIAL (CANCER CENTER ONLY)
Abs Immature Granulocytes: 0.01 10*3/uL (ref 0.00–0.07)
Basophils Absolute: 0 10*3/uL (ref 0.0–0.1)
Basophils Relative: 1 %
Eosinophils Absolute: 0 10*3/uL (ref 0.0–0.5)
Eosinophils Relative: 0 %
HCT: 31.4 % — ABNORMAL LOW (ref 36.0–46.0)
Hemoglobin: 10.1 g/dL — ABNORMAL LOW (ref 12.0–15.0)
Immature Granulocytes: 0 %
Lymphocytes Relative: 12 %
Lymphs Abs: 0.5 10*3/uL — ABNORMAL LOW (ref 0.7–4.0)
MCH: 28.5 pg (ref 26.0–34.0)
MCHC: 32.2 g/dL (ref 30.0–36.0)
MCV: 88.7 fL (ref 80.0–100.0)
Monocytes Absolute: 0.4 10*3/uL (ref 0.1–1.0)
Monocytes Relative: 9 %
Neutro Abs: 3.2 10*3/uL (ref 1.7–7.7)
Neutrophils Relative %: 78 %
Platelet Count: 117 10*3/uL — ABNORMAL LOW (ref 150–400)
RBC: 3.54 MIL/uL — ABNORMAL LOW (ref 3.87–5.11)
RDW: 16.8 % — ABNORMAL HIGH (ref 11.5–15.5)
WBC Count: 4.1 10*3/uL (ref 4.0–10.5)
nRBC: 2.7 % — ABNORMAL HIGH (ref 0.0–0.2)

## 2020-01-22 LAB — CMP (CANCER CENTER ONLY)
ALT: 12 U/L (ref 0–44)
AST: 11 U/L — ABNORMAL LOW (ref 15–41)
Albumin: 4.8 g/dL (ref 3.5–5.0)
Alkaline Phosphatase: 36 U/L — ABNORMAL LOW (ref 38–126)
Anion gap: 9 (ref 5–15)
BUN: 12 mg/dL (ref 8–23)
CO2: 27 mmol/L (ref 22–32)
Calcium: 10.2 mg/dL (ref 8.9–10.3)
Chloride: 100 mmol/L (ref 98–111)
Creatinine: 1.13 mg/dL — ABNORMAL HIGH (ref 0.44–1.00)
GFR, Estimated: 53 mL/min — ABNORMAL LOW (ref >60)
Glucose, Bld: 113 mg/dL — ABNORMAL HIGH (ref 70–99)
Potassium: 3.1 mmol/L — ABNORMAL LOW (ref 3.5–5.1)
Sodium: 136 mmol/L (ref 135–145)
Total Bilirubin: 0.4 mg/dL (ref 0.3–1.2)
Total Protein: 7.1 g/dL (ref 6.5–8.1)

## 2020-01-22 LAB — LACTATE DEHYDROGENASE: LDH: 170 U/L (ref 98–192)

## 2020-01-22 MED ORDER — HEPARIN SOD (PORK) LOCK FLUSH 100 UNIT/ML IV SOLN
500.0000 [IU] | Freq: Once | INTRAVENOUS | Status: AC
  Administered 2020-01-22: 500 [IU] via INTRAVENOUS
  Filled 2020-01-22: qty 5

## 2020-01-22 MED ORDER — SODIUM CHLORIDE 0.9% FLUSH
10.0000 mL | Freq: Once | INTRAVENOUS | Status: AC
  Administered 2020-01-22: 10 mL via INTRAVENOUS
  Filled 2020-01-22: qty 10

## 2020-01-22 NOTE — Telephone Encounter (Signed)
Appointments scheduled calendar printed/  I spoke with patient regarding her arriving for appts late.  I offered to move her to first appt of the day in order to keep her on time for all future appts. Per 12/23 los

## 2020-01-22 NOTE — Telephone Encounter (Signed)
Oral Oncology Patient Advocate Encounter  Received communication from [place] that the patient's eligibility in the patient assistance program for Xpovio will end on 01/30/20.    Coordinated with patient to sign application on 20/72/18.  MD portion completed 01/22/20.  The renewal application has been completed and faxed in an effort to keep the patient's out of pocket expense for Xpovio at $0.     Application completed and faxed to [phone].    [employer] patient assistance phone number for follow up is [phone].    This encounter will be updated until final determination.  [place] Patient [place] Phone [phone] Fax [phone] 01/22/2020 3:17 PM

## 2020-01-22 NOTE — Progress Notes (Signed)
Hematology and Oncology Follow Up Visit  ALEXA FAVELA [id number] 05-Oct-1950 69 y.o. 01/22/2020   Principle Diagnosis:  Recurrent lambda light chain myeloma - nl cytogenetics History of recurrent endometrial carcinoma Iron deficiency anemia-blood loss  PastTherapy: Status post second autologous stem cell transplant on 07/24/2014 Maintenance therapy with Pomalidomide/every 2 week Velcade - d/c'ed Radiation therapy for endometrial recurrence - completed 04/20/2015 Pomalyst/Kyprolis 70mg /m2 IV q 2 weeks - s/p cycle #12 - held on 12/26/2017 for hematuria Daratumumab/Pomalyst (1 mg) - status post cycle19 -- d/c on 08/21/2019  Current Therapy: Melflufen 40 mg IV q 4 weeks -- started on 08/27/2019, s/p cycle #2 --  D/c due to FDA removal Selinexor 80 mg po q week -- start on 01/19/2020 Femara 2.5 mg po q day Xgeva 120 mg subcu every 3 months - next dose in 01/2020 IV iron as indicated   Interim History:  Ms. Romig is here today for follow-up.  Unfortunately, her insurance company really gives a rough time trying to do the Selinexor.  I am really not sure what the problem was.  For some reason, they want her be on another monoclonal antibody.  Again, we try to fight with him about this.  She was finally able to get the Selinexor.  She started earlier this week.  She had some nausea with it.  There is little bit of diarrhea.  When we last saw her, her light chains actually had responded to the Melflufen.  The lambda light chain was down to 17.1 mg/dL.  Otherwise she is doing pretty well.  She little bit tired.  She been doing a lot of traveling over the holidays so far.  She has had no problems with pain.  There is no cough or shortness of breath.  She has had no headache.  There is been no bleeding.  She still is on the Femara for the endometrial malignancy.  Overall, her performance status is ECOG 1.     Medications:  Allergies as of 01/22/2020       Reactions   Codeine Nausea Only      Medication List       Accurate as of January 22, 2020  1:50 PM. If you have any questions, ask your nurse or doctor.        acyclovir 400 MG tablet Commonly known as: ZOVIRAX TAKE 1 TABLET(400 MG) BY MOUTH DAILY   albuterol 108 (90 Base) MCG/ACT inhaler Commonly known as: VENTOLIN HFA Inhale 2 puffs into the lungs every 6 (six) hours as needed for wheezing or shortness of breath. 2 puffs 3 times daily x 5 days then every 6 hours as needed.   amLODipine 10 MG tablet Commonly known as: NORVASC Take 1 tablet (10 mg total) by mouth every morning.   aspirin EC 81 MG tablet Take 81 mg by mouth 2 (two) times daily.   CALCIUM 1200+D3 PO Take 1 tablet by mouth daily.   cetirizine 10 MG tablet Commonly known as: ZyrTEC Allergy Take 1 tablet (10 mg total) by mouth at bedtime.   daratumumab 400 MG/20ML Commonly known as: DARZALEX Inject into the vein.   hydrochlorothiazide 12.5 MG capsule Commonly known as: MICROZIDE Take 12.5 mg by mouth every morning.   letrozole 2.5 MG tablet Commonly known as: FEMARA Take 1 tablet (2.5 mg total) by mouth daily.   lidocaine-prilocaine cream Commonly known as: EMLA Apply 1 application topically as needed.   loperamide 2 MG capsule Commonly known as: IMODIUM Take by mouth as  needed for diarrhea or loose stools. Reported on 08/19/2015   loratadine 10 MG tablet Commonly known as: CLARITIN Take 10 mg by mouth every morning. Reported on 08/19/2015   LORazepam 0.5 MG tablet Commonly known as: ATIVAN Take by mouth.   metoprolol succinate 50 MG 24 hr tablet Commonly known as: TOPROL-XL Take 1 tablet (50 mg total) by mouth every morning.   montelukast 10 MG tablet Commonly known as: SINGULAIR TAKE 1 TABLET(10 MG) BY MOUTH AT BEDTIME   Nasacort Allergy 24HR 55 MCG/ACT Aero nasal inhaler Generic drug: triamcinolone Place 2 sprays into the nose as needed.   ondansetron 8 MG tablet Commonly known  as: Zofran Take 1 tablet (8 mg total) by mouth 2 (two) times daily as needed (Nausea or vomiting).   potassium chloride SA 20 MEQ tablet Commonly known as: KLOR-CON TAKE 1 TABLET(20 MEQ) BY MOUTH TWICE DAILY   PROBIOTIC DAILY PO Take by mouth daily.   prochlorperazine 10 MG tablet Commonly known as: COMPAZINE Take 1 tablet (10 mg total) by mouth every 6 (six) hours as needed (Nausea or vomiting).   selinexor 40 MG Tbpk Take 80 mg by mouth once a week.   triamterene-hydrochlorothiazide 37.5-25 MG tablet Commonly known as: MAXZIDE-25 1 tablet by mouth at bedtime   Vitamin D3 50 MCG (2000 UT) Tabs Take 2 tablets by mouth daily.       Allergies:  Allergies  Allergen Reactions  . Codeine Nausea Only    Past Medical History, Surgical history, Social history, and Family History were reviewed and updated.  Review of Systems: Review of Systems  Constitutional: Negative.   HENT: Negative.   Eyes: Negative.   Respiratory: Negative.   Cardiovascular: Negative.   Gastrointestinal: Negative.   Genitourinary: Negative.   Musculoskeletal: Negative.   Skin: Negative.   Neurological: Negative.   Endo/Heme/Allergies: Negative.   Psychiatric/Behavioral: Negative.       Physical Exam:  weight is 176 lb (79.8 kg). Her oral temperature is 98.3 F (36.8 C). Her blood pressure is 117/70 and her pulse is 86. Her respiration is 18 and oxygen saturation is 100%.   Wt Readings from Last 3 Encounters:  01/22/20 176 lb (79.8 kg)  12/11/19 183 lb (83 kg)  11/05/19 191 lb (86.6 kg)    Physical Exam Vitals reviewed.  HENT:     Head: Normocephalic and atraumatic.  Eyes:     Pupils: Pupils are equal, round, and reactive to light.  Cardiovascular:     Rate and Rhythm: Normal rate and regular rhythm.     Heart sounds: Normal heart sounds.  Pulmonary:     Effort: Pulmonary effort is normal.     Breath sounds: Normal breath sounds.  Abdominal:     General: Bowel sounds are normal.      Palpations: Abdomen is soft.  Musculoskeletal:        General: No tenderness or deformity. Normal range of motion.     Cervical back: Normal range of motion.  Lymphadenopathy:     Cervical: No cervical adenopathy.  Skin:    General: Skin is warm and dry.     Findings: No erythema or rash.  Neurological:     Mental Status: She is alert and oriented to person, place, and time.  Psychiatric:        Behavior: Behavior normal.        Thought Content: Thought content normal.        Judgment: Judgment normal.      Lab  Results  Component Value Date   WBC 4.1 01/22/2020   HGB 10.1 (L) 01/22/2020   HCT 31.4 (L) 01/22/2020   MCV 88.7 01/22/2020   PLT 117 (L) 01/22/2020   Lab Results  Component Value Date   FERRITIN 2,550 (H) 11/05/2019   IRON 110 11/05/2019   TIBC 328 11/05/2019   UIBC 218 11/05/2019   IRONPCTSAT 33 11/05/2019   Lab Results  Component Value Date   RETICCTPCT 1.6 11/05/2019   RBC 3.54 (L) 01/22/2020   Lab Results  Component Value Date   KPAFRELGTCHN 3.4 12/11/2019   LAMBDASER 171.4 (H) 12/11/2019   KAPLAMBRATIO 0.02 (L) 12/11/2019   Lab Results  Component Value Date   IGGSERUM 338 (L) 12/11/2019   IGA 30 (L) 12/11/2019   IGMSERUM 5 (L) 12/11/2019   Lab Results  Component Value Date   TOTALPROTELP 5.9 (L) 12/11/2019   ALBUMINELP 3.5 12/11/2019   A1GS 0.2 12/11/2019   A2GS 0.9 12/11/2019   BETS 1.0 12/11/2019   BETA2SER 0.4 11/23/2014   GAMS 0.3 (L) 12/11/2019   MSPIKE Not Observed 12/11/2019   SPEI Comment 11/05/2019     Chemistry      Component Value Date/Time   NA 136 01/22/2020 1235   NA 141 01/10/2017 1115   NA 140 06/21/2016 0918   K 3.1 (L) 01/22/2020 1235   K 4.0 01/10/2017 1115   K 4.3 06/21/2016 0918   CL 100 01/22/2020 1235   CL 106 01/10/2017 1115   CO2 27 01/22/2020 1235   CO2 27 01/10/2017 1115   CO2 20 (L) 06/21/2016 0918   BUN 12 01/22/2020 1235   BUN 15 01/10/2017 1115   BUN 15.8 06/21/2016 0918   CREATININE  1.13 (H) 01/22/2020 1235   CREATININE 1.0 01/10/2017 1115   CREATININE 0.8 06/21/2016 0918      Component Value Date/Time   CALCIUM 10.2 01/22/2020 1235   CALCIUM 9.5 01/10/2017 1115   CALCIUM 9.4 06/21/2016 0918   ALKPHOS 36 (L) 01/22/2020 1235   ALKPHOS 40 01/10/2017 1115   ALKPHOS 66 06/21/2016 0918   AST 11 (L) 01/22/2020 1235   AST 17 06/21/2016 0918   ALT 12 01/22/2020 1235   ALT 19 01/10/2017 1115   ALT 37 06/21/2016 0918   BILITOT 0.4 01/22/2020 1235   BILITOT 0.32 06/21/2016 0918       Impression and Plan: Ms. Njoroge is a very pleasant 69 yo African American female with recurrent lambda light chain myeloma. Her second stem cell transplant for light chain myeloma was in June 2016.   Shealsohaslocalized recurrent endometrial cancer (followed by gyn onc Dr. Nelly Rout) and is currently on Femara.   Hopefully, we will have a response with the Selinexor.  It is too early for [place] know right now.  We will just have to see how she does with taking the Selinexor weekly.  I will plan to get her back in about a month.  I think this would be reasonable.  We will then check her light chains and see how everything looks.  If everything looks better than we just keep her on single agent Selinexor.  If things are progressing, we may have to add Velcade.    Josph Macho, MD 12/23/20211:50 PM

## 2020-01-22 NOTE — Patient Instructions (Signed)

## 2020-01-23 LAB — IGG, IGA, IGM
IgA: 32 mg/dL — ABNORMAL LOW (ref 87–352)
IgG (Immunoglobin G), Serum: 504 mg/dL — ABNORMAL LOW (ref 586–1602)
IgM (Immunoglobulin M), Srm: 11 mg/dL — ABNORMAL LOW (ref 26–217)

## 2020-01-26 ENCOUNTER — Encounter: Payer: Self-pay | Admitting: *Deleted

## 2020-01-26 LAB — PROTEIN ELECTROPHORESIS, SERUM, WITH REFLEX
A/G Ratio: 1.9 — ABNORMAL HIGH (ref 0.7–1.7)
Albumin ELP: 4.4 g/dL (ref 2.9–4.4)
Alpha-1-Globulin: 0.2 g/dL (ref 0.0–0.4)
Alpha-2-Globulin: 0.8 g/dL (ref 0.4–1.0)
Beta Globulin: 1 g/dL (ref 0.7–1.3)
Gamma Globulin: 0.4 g/dL (ref 0.4–1.8)
Globulin, Total: 2.3 g/dL (ref 2.2–3.9)
Total Protein ELP: 6.7 g/dL (ref 6.0–8.5)

## 2020-01-26 LAB — KAPPA/LAMBDA LIGHT CHAINS
Kappa free light chain: 2.4 mg/L — ABNORMAL LOW (ref 3.3–19.4)
Kappa, lambda light chain ratio: 0.01 — ABNORMAL LOW (ref 0.26–1.65)
Lambda free light chains: 162.6 mg/L — ABNORMAL HIGH (ref 5.7–26.3)

## 2020-01-27 NOTE — Telephone Encounter (Signed)
Resent NCDOI appeal to [phone].  Emily Greer has received emails confirming it was received and accepted to review.  They will send a decision from the [place] ([employer]) by 01/30/20.  [place] Patient [place] Phone [phone] Fax [phone] 01/27/2020 12:04 PM

## 2020-02-02 ENCOUNTER — Telehealth: Payer: Self-pay | Admitting: Pharmacy Technician

## 2020-02-02 ENCOUNTER — Telehealth: Payer: Self-pay | Admitting: Pharmacist

## 2020-02-02 DIAGNOSIS — C9002 Multiple myeloma in relapse: Secondary | ICD-10-CM

## 2020-02-02 MED ORDER — SELINEXOR (80 MG ONCE WEEKLY) 40 MG PO TBPK: 80.0000 mg | ORAL_TABLET | ORAL | 3 refills | Status: DC

## 2020-02-02 NOTE — Telephone Encounter (Signed)
Oral Chemotherapy Pharmacist Encounter  Third level appeal for Xpovio was submitted to [facility] Department of Insurance. Insurance decision was overturned, Research scientist (life sciences) now approved.   Pt notified, Rx sent to [facility]ologics pharmacy for dispensing. Pt knows to expect a call.   Darl Pikes, PharmD, BCPS, BCOP, CPP Hematology/Oncology Clinical Pharmacist [facility]/HP/AP [facility] [phone]  02/02/2020 10:46 AM

## 2020-02-02 NOTE — Telephone Encounter (Signed)
Erroneous encounter

## 2020-02-03 NOTE — Telephone Encounter (Signed)
Oral Oncology Patient Advocate Encounter  Second Level appeal required by manufacturer assistance ([employer]).  Submitted 01/06/20 to insurance.  Received notification that the determination had been upheld on 01/07/20.  Spoke to [place] from [place] and she said that per the manufacturer, an external review should be made to the [place] ([employer]) stating that the patient cannot take Dexamethasone because of her blood sugar and the other two preferred agents are too similar to Darzalex that the [place] may overturn the insurance denial.  Faxed external review appeal on 01/27/20 to the [place] and received a determination on 01/29/20 that the denial had be overturned.    Called patients insurance to verify the NCDOI's determination had been updated in their system and on 02/02/20 it had not.  Called again today and reversal of the denial had been received.  Patient has a $5900 (may include deductible) and is eligible for a copay card to help cover the large copay.  Will follow up with [facility] until medication delivered.  [place] Patient [place] Phone [phone] Fax [phone] 02/03/2020 2:00 PM

## 2020-02-04 NOTE — Telephone Encounter (Signed)
NCDOI has reversed the [place] denial of Xpovio.  Insurance has updated their records and received a paid test claim.  [employer] will close patient's case due to medication being approved and patient being able to obtain copay card.  [place] Patient [place] Phone [phone] Fax [phone] 02/04/2020 10:16 AM

## 2020-02-09 ENCOUNTER — Other Ambulatory Visit: Payer: Self-pay

## 2020-02-09 ENCOUNTER — Encounter: Payer: Self-pay | Admitting: Obstetrics & Gynecology

## 2020-02-09 ENCOUNTER — Ambulatory Visit (INDEPENDENT_AMBULATORY_CARE_PROVIDER_SITE_OTHER): Payer: Medicare Other | Admitting: Obstetrics & Gynecology

## 2020-02-09 VITALS — BP 151/83 | HR 80 | Wt 181.0 lb

## 2020-02-09 DIAGNOSIS — R87810 Cervical high risk human papillomavirus (HPV) DNA test positive: Secondary | ICD-10-CM

## 2020-02-09 DIAGNOSIS — R87610 Atypical squamous cells of undetermined significance on cytologic smear of cervix (ASC-US): Secondary | ICD-10-CM

## 2020-02-09 NOTE — Progress Notes (Signed)
Patient given informed consent, signed copy in the chart, time out was performed.  Placed in lithotomy position. Cervix viewed with speculum and colposcope after application of acetic acid.  11/05/2019 PositiveAbnormal    Adequacy Satisfactory for evaluation; transformation zone component PRESENT.   Diagnosis - Atypical squamous cells of undetermined significance (ASC-US)Abnormal   Comment Normal Reference Range HPV - Negative    GU: EGBUS: no lesions Vagina: pt is s/p radiation and the vagina is shotrened. Unable to introcuce speculu.   Will send note to Dr. Denman George with recommendations for next steps.   Patient was given post procedure instructions.  She will return in 2 weeks for results.  Florencia Zaccaro L. Harraway-Smith, M.D., [name]

## 2020-02-10 NOTE — Telephone Encounter (Signed)
Patient scheduled to receive Xpovio from [place].  Copay $5 with copay card.  [place] Patient [place] Phone [phone] Fax [phone] 02/10/2020 11:21 AM

## 2020-02-19 ENCOUNTER — Inpatient Hospital Stay: Payer: Medicare Other

## 2020-02-19 ENCOUNTER — Other Ambulatory Visit: Payer: Self-pay

## 2020-02-19 ENCOUNTER — Telehealth: Payer: Self-pay

## 2020-02-19 ENCOUNTER — Inpatient Hospital Stay: Payer: Medicare Other | Attending: Hematology & Oncology

## 2020-02-19 ENCOUNTER — Inpatient Hospital Stay (HOSPITAL_BASED_OUTPATIENT_CLINIC_OR_DEPARTMENT_OTHER): Payer: Medicare Other | Admitting: Hematology & Oncology

## 2020-02-19 ENCOUNTER — Encounter: Payer: Self-pay | Admitting: Hematology & Oncology

## 2020-02-19 VITALS — BP 135/77 | HR 79 | Temp 98.3°F | Resp 18

## 2020-02-19 DIAGNOSIS — C9000 Multiple myeloma not having achieved remission: Secondary | ICD-10-CM

## 2020-02-19 DIAGNOSIS — Z79899 Other long term (current) drug therapy: Secondary | ICD-10-CM | POA: Diagnosis not present

## 2020-02-19 DIAGNOSIS — C541 Malignant neoplasm of endometrium: Secondary | ICD-10-CM

## 2020-02-19 DIAGNOSIS — C9001 Multiple myeloma in remission: Secondary | ICD-10-CM

## 2020-02-19 DIAGNOSIS — Z923 Personal history of irradiation: Secondary | ICD-10-CM | POA: Diagnosis not present

## 2020-02-19 DIAGNOSIS — Z95828 Presence of other vascular implants and grafts: Secondary | ICD-10-CM

## 2020-02-19 DIAGNOSIS — D500 Iron deficiency anemia secondary to blood loss (chronic): Secondary | ICD-10-CM | POA: Diagnosis not present

## 2020-02-19 DIAGNOSIS — Z885 Allergy status to narcotic agent status: Secondary | ICD-10-CM | POA: Diagnosis not present

## 2020-02-19 DIAGNOSIS — C9002 Multiple myeloma in relapse: Secondary | ICD-10-CM

## 2020-02-19 LAB — CBC WITH DIFFERENTIAL (CANCER CENTER ONLY)
Abs Immature Granulocytes: 0 10*3/uL (ref 0.00–0.07)
Basophils Absolute: 0 10*3/uL (ref 0.0–0.1)
Basophils Relative: 1 %
Eosinophils Absolute: 0 10*3/uL (ref 0.0–0.5)
Eosinophils Relative: 0 %
HCT: 30 % — ABNORMAL LOW (ref 36.0–46.0)
Hemoglobin: 10 g/dL — ABNORMAL LOW (ref 12.0–15.0)
Immature Granulocytes: 0 %
Lymphocytes Relative: 26 %
Lymphs Abs: 0.6 10*3/uL — ABNORMAL LOW (ref 0.7–4.0)
MCH: 29 pg (ref 26.0–34.0)
MCHC: 33.3 g/dL (ref 30.0–36.0)
MCV: 87 fL (ref 80.0–100.0)
Monocytes Absolute: 0.2 10*3/uL (ref 0.1–1.0)
Monocytes Relative: 11 %
Neutro Abs: 1.4 10*3/uL — ABNORMAL LOW (ref 1.7–7.7)
Neutrophils Relative %: 62 %
Platelet Count: 52 10*3/uL — ABNORMAL LOW (ref 150–400)
RBC: 3.45 MIL/uL — ABNORMAL LOW (ref 3.87–5.11)
RDW: 16.1 % — ABNORMAL HIGH (ref 11.5–15.5)
WBC Count: 2.2 10*3/uL — ABNORMAL LOW (ref 4.0–10.5)
nRBC: 2.3 % — ABNORMAL HIGH (ref 0.0–0.2)

## 2020-02-19 LAB — CMP (CANCER CENTER ONLY)
ALT: 14 U/L (ref 0–44)
AST: 11 U/L — ABNORMAL LOW (ref 15–41)
Albumin: 4.7 g/dL (ref 3.5–5.0)
Alkaline Phosphatase: 26 U/L — ABNORMAL LOW (ref 38–126)
Anion gap: 9 (ref 5–15)
BUN: 16 mg/dL (ref 8–23)
CO2: 26 mmol/L (ref 22–32)
Calcium: 9.9 mg/dL (ref 8.9–10.3)
Chloride: 101 mmol/L (ref 98–111)
Creatinine: 0.87 mg/dL (ref 0.44–1.00)
GFR, Estimated: >60 mL/min (ref >60)
Glucose, Bld: 91 mg/dL (ref 70–99)
Potassium: 3.4 mmol/L — ABNORMAL LOW (ref 3.5–5.1)
Sodium: 136 mmol/L (ref 135–145)
Total Bilirubin: 0.5 mg/dL (ref 0.3–1.2)
Total Protein: 6.6 g/dL (ref 6.5–8.1)

## 2020-02-19 LAB — LACTATE DEHYDROGENASE: LDH: 146 U/L (ref 98–192)

## 2020-02-19 MED ORDER — HEPARIN SOD (PORK) LOCK FLUSH 100 UNIT/ML IV SOLN
500.0000 [IU] | Freq: Once | INTRAVENOUS | Status: AC
  Administered 2020-02-19: 500 [IU] via INTRAVENOUS
  Filled 2020-02-19: qty 5

## 2020-02-19 MED ORDER — DENOSUMAB 120 MG/1.7ML ~~LOC~~ SOLN
120.0000 mg | Freq: Once | SUBCUTANEOUS | Status: AC
  Administered 2020-02-19: 120 mg via SUBCUTANEOUS

## 2020-02-19 MED ORDER — DENOSUMAB 120 MG/1.7ML ~~LOC~~ SOLN
SUBCUTANEOUS | Status: AC
  Filled 2020-02-19: qty 1.7

## 2020-02-19 MED ORDER — SODIUM CHLORIDE 0.9% FLUSH
10.0000 mL | Freq: Once | INTRAVENOUS | Status: AC
  Administered 2020-02-19: 10 mL via INTRAVENOUS
  Filled 2020-02-19: qty 10

## 2020-02-19 NOTE — Patient Instructions (Signed)
Tunneled Central Venous Catheter Flushing Guide  It is important to flush your tunneled central venous catheter each time you use it, both before and after you use it. Flushing your catheter will help prevent it from clogging. What are the risks? Risks may include:  Infection.  Air getting into the catheter and bloodstream. Supplies needed:  A clean pair of gloves.  A disinfecting wipe. Use an alcohol wipe, chlorhexidine wipe, or iodine wipe as told by your health care provider.  A 10 mL syringe that has been prefilled with saline solution.  An empty 10 mL syringe, if a substance called heparin was injected into your catheter. How to flush your catheter When you flush your catheter, make sure you follow any specific instructions from your health care provider or the manufacturer. These are general guidelines. Flushing your catheter before use If there is heparin in your catheter: 1. Wash your hands with soap and water. 2. Put on gloves. 3. Scrub the injection cap for a minimum of 15 seconds with a disinfecting wipe. 4. Unclamp the catheter. 5. Attach the empty syringe to the injection cap. 6. Pull the syringe plunger back and withdraw 10 mL of blood. 7. Place the syringe into an appropriate waste container. 8. Scrub the injection cap for 15 seconds with a disinfecting wipe. 9. Attach the prefilled syringe to the injection cap. 10. Flush the catheter by pushing the plunger forward until all the liquid from the syringe is in the catheter. 11. Remove the syringe from the injection cap. 12. Clamp the catheter. If there is no heparin in your catheter: 1. Wash your hands with soap and water. 2. Put on gloves. 3. Scrub the injection cap for 15 seconds with a disinfecting wipe. 4. Unclamp the catheter. 5. Attach the prefilled syringe to the injection cap. 6. Flush the catheter by pushing the plunger forward until 5 mL of the liquid from the syringe is in the catheter. 7. Pull back on  the syringe until you see blood in the catheter. 8. If you have been asked to collect any blood, follow your health care provider's instructions. Otherwise, flush the catheter with the rest of the solution from the syringe. 9. Remove the syringe from the injection cap. 10. Clamp the catheter.   Flushing your catheter after use 1. Wash your hands with soap and water. 2. Put on gloves. 3. Scrub the injection cap for 15 seconds with a disinfecting wipe. 4. Unclamp the catheter. 5. Attach the prefilled syringe to the injection cap. 6. Flush the catheter by pushing the plunger forward until all of the liquid from the syringe is in the catheter. 7. Remove the syringe from the injection cap. 8. Clamp the catheter. Problems and solutions  If blood cannot be completely cleared from the injection cap, you may need to have the injection cap replaced.  If the catheter is difficult to flush, use the pulsing method. The pulsing method involves pushing only a few milliliters of solution into the catheter at a time and pausing between pushes.  If you do not see blood in the catheter when you pull back on the syringe, change your body position, such as by raising your arms above your head. Take a deep breath and cough. Then, pull back on the syringe. If you still do not see blood, flush the catheter with a small amount of solution. Then, change positions again and take a breath or cough. Pull back on the syringe again. If you still do not   see blood, finish flushing the catheter and contact your health care provider. Do not use your catheter until your health care provider says it is okay. General tips  Have someone help you flush your catheter, if possible.  Do not force fluid through your catheter.  Do not use a syringe that is larger or smaller than 10 mL. Using a smaller syringe can make the catheter burst.  Do not use your catheter without flushing it first if it has heparin in it. Contact a health  care provider if:  You cannot see any blood in the catheter when you flush it before using it.  Your catheter is difficult to flush. Get help right away if:  You cannot flush the catheter.  The catheter leaks when you flush it or when there is fluid in it.  There are cracks or breaks in the catheter. Summary  It is important to flush your tunneled central venous catheter each time you use it, both before and after you use it.  Scrub the injection cap for 15 seconds with a disinfecting wipe before and after you flush it.  When you flush your catheter, make sure you follow any specific instructions from your health care provider or the manufacturer.  Get help right away if you cannot flush the catheter. This information is not intended to replace advice given to you by your health care provider. Make sure you discuss any questions you have with your health care provider. Document Revised: 03/27/2019 Document Reviewed: 04/03/2018 Elsevier Patient Education  2021 [employer]  

## 2020-02-19 NOTE — Progress Notes (Signed)
Hematology and Oncology Follow Up Visit  ZELLAH BLANSETT [id number] March 14, 1950 70 y.o. 02/19/2020   Principle Diagnosis:  Recurrent lambda light chain myeloma - nl cytogenetics History of recurrent endometrial carcinoma Iron deficiency anemia-blood loss  PastTherapy: Status post second autologous stem cell transplant on 07/24/2014 Maintenance therapy with Pomalidomide/every 2 week Velcade - d/c'ed Radiation therapy for endometrial recurrence - completed 04/20/2015 Pomalyst/Kyprolis 70mg /m2 IV q 2 weeks - s/p cycle #12 - held on 12/26/2017 for hematuria Daratumumab/Pomalyst (1 mg) - status post cycle19 -- d/c on 08/21/2019  Current Therapy: Melflufen 40 mg IV q 4 weeks -- started on 08/27/2019, s/p cycle #2 --  D/c due to FDA removal Selinexor 80 mg po q week -- start on 01/19/2020 Femara 2.5 mg po q day Xgeva 120 mg subcu every 3 months - next dose in 04/2020 IV iron as indicated   Interim History:  Ms. Kubas is here today for follow-up.  So far, she is doing okay with the Selinexor.  She has had 3 doses so far.  I think she just got the fourth 1.  Her white cell count and platelet count are on the low side.  I have to believe this is from the Selinexor.  She takes the [employer] on Mondays.  She has not had any problems with diarrhea.  She has had no bleeding.  There is been no issues with nausea or vomiting.  She did have a nice Christmas and New Year's.  There has been no leg swelling.  She has had no rashes.  Only last saw her back in late December, her lambda light chain was 16.3 mg/dL.  This she was a little bit better.  She still is on the Femara for the endometrial malignancy.  Overall, her performance status is ECOG 1.     Medications:  Allergies as of 02/19/2020      Reactions   Codeine Nausea Only      Medication List       Accurate as of February 19, 2020  3:42 PM. If you have any questions, ask your nurse or doctor.         acyclovir 400 MG tablet Commonly known as: ZOVIRAX TAKE 1 TABLET(400 MG) BY MOUTH DAILY   albuterol 108 (90 Base) MCG/ACT inhaler Commonly known as: VENTOLIN HFA Inhale 2 puffs into the lungs every 6 (six) hours as needed for wheezing or shortness of breath. 2 puffs 3 times daily x 5 days then every 6 hours as needed.   amLODipine 10 MG tablet Commonly known as: NORVASC Take 1 tablet (10 mg total) by mouth every morning.   aspirin EC 81 MG tablet Take 81 mg by mouth 2 (two) times daily.   CALCIUM 1200+D3 PO Take 1 tablet by mouth daily.   cetirizine 10 MG tablet Commonly known as: ZyrTEC Allergy Take 1 tablet (10 mg total) by mouth at bedtime.   daratumumab 400 MG/20ML Commonly known as: DARZALEX Inject into the vein.   hydrochlorothiazide 12.5 MG capsule Commonly known as: MICROZIDE Take 12.5 mg by mouth every morning.   letrozole 2.5 MG tablet Commonly known as: FEMARA Take 1 tablet (2.5 mg total) by mouth daily.   lidocaine-prilocaine cream Commonly known as: EMLA Apply 1 application topically as needed.   loperamide 2 MG capsule Commonly known as: IMODIUM Take by mouth as needed for diarrhea or loose stools. Reported on 08/19/2015   loratadine 10 MG tablet Commonly known as: CLARITIN Take 10 mg by mouth every morning.  Reported on 08/19/2015   LORazepam 0.5 MG tablet Commonly known as: ATIVAN Take by mouth.   metoprolol succinate 50 MG 24 hr tablet Commonly known as: TOPROL-XL Take 1 tablet (50 mg total) by mouth every morning.   montelukast 10 MG tablet Commonly known as: SINGULAIR TAKE 1 TABLET(10 MG) BY MOUTH AT BEDTIME   ondansetron 8 MG tablet Commonly known as: Zofran Take 1 tablet (8 mg total) by mouth 2 (two) times daily as needed (Nausea or vomiting).   potassium chloride SA 20 MEQ tablet Commonly known as: KLOR-CON TAKE 1 TABLET(20 MEQ) BY MOUTH TWICE DAILY   PROBIOTIC DAILY PO Take by mouth daily.   prochlorperazine 10 MG  tablet Commonly known as: COMPAZINE Take 1 tablet (10 mg total) by mouth every 6 (six) hours as needed (Nausea or vomiting).   selinexor 40 MG Tbpk Take 80 mg by mouth once a week.   triamcinolone 55 MCG/ACT Aero nasal inhaler Commonly known as: NASACORT Place 2 sprays into the nose as needed.   triamterene-hydrochlorothiazide 37.5-25 MG tablet Commonly known as: MAXZIDE-25 1 tablet by mouth at bedtime   Vitamin D3 50 MCG (2000 UT) Tabs Take 2 tablets by mouth daily.       Allergies:  Allergies  Allergen Reactions  . Codeine Nausea Only    Past Medical History, Surgical history, Social history, and Family History were reviewed and updated.  Review of Systems: Review of Systems  Constitutional: Negative.   HENT: Negative.   Eyes: Negative.   Respiratory: Negative.   Cardiovascular: Negative.   Gastrointestinal: Negative.   Genitourinary: Negative.   Musculoskeletal: Negative.   Skin: Negative.   Neurological: Negative.   Endo/Heme/Allergies: Negative.   Psychiatric/Behavioral: Negative.       Physical Exam:  vitals were not taken for this visit.   Wt Readings from Last 3 Encounters:  02/09/20 181 lb (82.1 kg)  01/22/20 176 lb (79.8 kg)  12/11/19 183 lb (83 kg)    Physical Exam Vitals reviewed.  HENT:     Head: Normocephalic and atraumatic.  Eyes:     Pupils: Pupils are equal, round, and reactive to light.  Cardiovascular:     Rate and Rhythm: Normal rate and regular rhythm.     Heart sounds: Normal heart sounds.  Pulmonary:     Effort: Pulmonary effort is normal.     Breath sounds: Normal breath sounds.  Abdominal:     General: Bowel sounds are normal.     Palpations: Abdomen is soft.  Musculoskeletal:        General: No tenderness or deformity. Normal range of motion.     Cervical back: Normal range of motion.  Lymphadenopathy:     Cervical: No cervical adenopathy.  Skin:    General: Skin is warm and dry.     Findings: No erythema or rash.   Neurological:     Mental Status: She is alert and oriented to person, place, and time.  Psychiatric:        Behavior: Behavior normal.        Thought Content: Thought content normal.        Judgment: Judgment normal.      Lab Results  Component Value Date   WBC 2.2 (L) 02/19/2020   HGB 10.0 (L) 02/19/2020   HCT 30.0 (L) 02/19/2020   MCV 87.0 02/19/2020   PLT 52 (L) 02/19/2020   Lab Results  Component Value Date   FERRITIN 2,550 (H) 11/05/2019   IRON 110 11/05/2019  TIBC 328 11/05/2019   UIBC 218 11/05/2019   IRONPCTSAT 33 11/05/2019   Lab Results  Component Value Date   RETICCTPCT 1.6 11/05/2019   RBC 3.45 (L) 02/19/2020   Lab Results  Component Value Date   KPAFRELGTCHN 2.4 (L) 01/22/2020   LAMBDASER 162.6 (H) 01/22/2020   KAPLAMBRATIO 0.01 (L) 01/22/2020   Lab Results  Component Value Date   IGGSERUM 504 (L) 01/22/2020   IGA 32 (L) 01/22/2020   IGMSERUM 11 (L) 01/22/2020   Lab Results  Component Value Date   TOTALPROTELP 6.7 01/22/2020   ALBUMINELP 4.4 01/22/2020   A1GS 0.2 01/22/2020   A2GS 0.8 01/22/2020   BETS 1.0 01/22/2020   BETA2SER 0.4 11/23/2014   GAMS 0.4 01/22/2020   MSPIKE Not Observed 01/22/2020   SPEI Comment 11/05/2019     Chemistry      Component Value Date/Time   NA 136 02/19/2020 1450   NA 141 01/10/2017 1115   NA 140 06/21/2016 0918   K 3.4 (L) 02/19/2020 1450   K 4.0 01/10/2017 1115   K 4.3 06/21/2016 0918   CL 101 02/19/2020 1450   CL 106 01/10/2017 1115   CO2 26 02/19/2020 1450   CO2 27 01/10/2017 1115   CO2 20 (L) 06/21/2016 0918   BUN 16 02/19/2020 1450   BUN 15 01/10/2017 1115   BUN 15.8 06/21/2016 0918   CREATININE 0.87 02/19/2020 1450   CREATININE 1.0 01/10/2017 1115   CREATININE 0.8 06/21/2016 0918      Component Value Date/Time   CALCIUM 9.9 02/19/2020 1450   CALCIUM 9.5 01/10/2017 1115   CALCIUM 9.4 06/21/2016 0918   ALKPHOS 26 (L) 02/19/2020 1450   ALKPHOS 40 01/10/2017 1115   ALKPHOS 66 06/21/2016  0918   AST 11 (L) 02/19/2020 1450   AST 17 06/21/2016 0918   ALT 14 02/19/2020 1450   ALT 19 01/10/2017 1115   ALT 37 06/21/2016 0918   BILITOT 0.5 02/19/2020 1450   BILITOT 0.32 06/21/2016 0918       Impression and Plan: Ms. Ortez is a very pleasant 70 yo African American female with recurrent lambda light chain myeloma. Her second stem cell transplant for light chain myeloma was in June 2016.   Shealsohaslocalized recurrent endometrial cancer (followed by gyn onc Dr. Nelly Rout) and is currently on Femara.   Hopefully, we will have a response with the Selinexor.  I now, we really should have a good idea as to whether or not it is going to help.  Hopefully, it will help.   I will plan to get her back in about a month.  I think this would be reasonable.  We will then check her light chains and see how everything looks.  If everything looks better than we just keep her on single agent Selinexor.  If things are progressing, we may have to add Velcade.    Josph Macho, MD 1/20/20223:42 PM

## 2020-02-19 NOTE — Telephone Encounter (Signed)
Called and left a vm with f/u appts per 1-20 los   Ethon Wymer

## 2020-02-19 NOTE — Patient Instructions (Signed)
Denosumab injection What is this medicine? DENOSUMAB (den oh sue mab) slows bone breakdown. Prolia is used to treat osteoporosis in women after menopause and in men, and in people who are taking corticosteroids for 6 months or more. Xgeva is used to treat a high calcium level due to cancer and to prevent bone fractures and other bone problems caused by multiple myeloma or cancer bone metastases. Xgeva is also used to treat giant cell tumor of the bone. This medicine may be used for other purposes; ask your health care provider or pharmacist if you have questions. COMMON BRAND NAME(S): Prolia, XGEVA What should I tell my health care provider before I take this medicine? They need to know if you have any of these conditions:  dental disease  having surgery or tooth extraction  infection  kidney disease  low levels of calcium or Vitamin D in the blood  malnutrition  on hemodialysis  skin conditions or sensitivity  thyroid or parathyroid disease  an unusual reaction to denosumab, other medicines, foods, dyes, or preservatives  pregnant or trying to get pregnant  breast-feeding How should I use this medicine? This medicine is for injection under the skin. It is given by a health care professional in a hospital or clinic setting. A special MedGuide will be given to you before each treatment. Be sure to read this information carefully each time. For Prolia, talk to your pediatrician regarding the use of this medicine in children. Special care may be needed. For Xgeva, talk to your pediatrician regarding the use of this medicine in children. While this drug may be prescribed for children as young as 13 years for selected conditions, precautions do apply. Overdosage: If you think you have taken too much of this medicine contact a poison control center or emergency room at once. NOTE: This medicine is only for you. Do not share this medicine with others. What if I miss a dose? It is  important not to miss your dose. Call your doctor or health care professional if you are unable to keep an appointment. What may interact with this medicine? Do not take this medicine with any of the following medications:  other medicines containing denosumab This medicine may also interact with the following medications:  medicines that lower your chance of fighting infection  steroid medicines like prednisone or cortisone This list may not describe all possible interactions. Give your health care provider a list of all the medicines, herbs, non-prescription drugs, or dietary supplements you use. Also tell them if you smoke, drink alcohol, or use illegal drugs. Some items may interact with your medicine. What should I watch for while using this medicine? Visit your doctor or health care professional for regular checks on your progress. Your doctor or health care professional may order blood tests and other tests to see how you are doing. Call your doctor or health care professional for advice if you get a fever, chills or sore throat, or other symptoms of a cold or flu. Do not treat yourself. This drug may decrease your body's ability to fight infection. Try to avoid being around people who are sick. You should make sure you get enough calcium and vitamin D while you are taking this medicine, unless your doctor tells you not to. Discuss the foods you eat and the vitamins you take with your health care professional. See your dentist regularly. Brush and floss your teeth as directed. Before you have any dental work done, tell your dentist you are   receiving this medicine. Do not become pregnant while taking this medicine or for 5 months after stopping it. Talk with your doctor or health care professional about your birth control options while taking this medicine. Women should inform their doctor if they wish to become pregnant or think they might be pregnant. There is a potential for serious side  effects to an unborn child. Talk to your health care professional or pharmacist for more information. What side effects may I notice from receiving this medicine? Side effects that you should report to your doctor or health care professional as soon as possible:  allergic reactions like skin rash, itching or hives, swelling of the face, lips, or tongue  bone pain  breathing problems  dizziness  jaw pain, especially after dental work  redness, blistering, peeling of the skin  signs and symptoms of infection like fever or chills; cough; sore throat; pain or trouble passing urine  signs of low calcium like fast heartbeat, muscle cramps or muscle pain; pain, tingling, numbness in the hands or feet; seizures  unusual bleeding or bruising  unusually weak or tired Side effects that usually do not require medical attention (report to your doctor or health care professional if they continue or are bothersome):  constipation  diarrhea  headache  joint pain  loss of appetite  muscle pain  runny nose  tiredness  upset stomach This list may not describe all possible side effects. Call your doctor for medical advice about side effects. You may report side effects to FDA at 1-800-FDA-1088. Where should I keep my medicine? This medicine is only given in a clinic, doctor's office, or other health care setting and will not be stored at home. NOTE: This sheet is a summary. It may not cover all possible information. If you have questions about this medicine, talk to your doctor, pharmacist, or health care provider.  2021 [employer]vier/Gold Standard (2017-05-25 16:10:44)

## 2020-02-20 ENCOUNTER — Encounter: Payer: Self-pay | Admitting: *Deleted

## 2020-02-20 LAB — PROTEIN ELECTROPHORESIS, SERUM, WITH REFLEX
A/G Ratio: 1.7 (ref 0.7–1.7)
Albumin ELP: 4.1 g/dL (ref 2.9–4.4)
Alpha-1-Globulin: 0.2 g/dL (ref 0.0–0.4)
Alpha-2-Globulin: 0.8 g/dL (ref 0.4–1.0)
Beta Globulin: 0.9 g/dL (ref 0.7–1.3)
Gamma Globulin: 0.4 g/dL (ref 0.4–1.8)
Globulin, Total: 2.4 g/dL (ref 2.2–3.9)
Total Protein ELP: 6.5 g/dL (ref 6.0–8.5)

## 2020-02-20 LAB — IGG, IGA, IGM
IgA: 20 mg/dL — ABNORMAL LOW (ref 87–352)
IgG (Immunoglobin G), Serum: 424 mg/dL — ABNORMAL LOW (ref 586–1602)
IgM (Immunoglobulin M), Srm: 6 mg/dL — ABNORMAL LOW (ref 26–217)

## 2020-02-20 LAB — KAPPA/LAMBDA LIGHT CHAINS
Kappa free light chain: 3.3 mg/L (ref 3.3–19.4)
Kappa, lambda light chain ratio: 0.03 — ABNORMAL LOW (ref 0.26–1.65)
Lambda free light chains: 129 mg/L — ABNORMAL HIGH (ref 5.7–26.3)

## 2020-02-23 ENCOUNTER — Telehealth: Payer: Self-pay | Admitting: Hematology & Oncology

## 2020-02-23 NOTE — Telephone Encounter (Signed)
I returned call to patient regarding her request for appointments to be moved from 2/18 over to 2/17.  Appointments were moved and she was ok with new date & time

## 2020-02-25 ENCOUNTER — Telehealth: Payer: Medicare Other

## 2020-03-18 ENCOUNTER — Inpatient Hospital Stay (HOSPITAL_BASED_OUTPATIENT_CLINIC_OR_DEPARTMENT_OTHER): Payer: Medicare Other | Admitting: Hematology & Oncology

## 2020-03-18 ENCOUNTER — Inpatient Hospital Stay: Payer: Medicare Other | Attending: Hematology & Oncology

## 2020-03-18 ENCOUNTER — Inpatient Hospital Stay: Payer: Medicare Other

## 2020-03-18 ENCOUNTER — Other Ambulatory Visit: Payer: Self-pay

## 2020-03-18 ENCOUNTER — Telehealth: Payer: Self-pay | Admitting: Hematology & Oncology

## 2020-03-18 ENCOUNTER — Other Ambulatory Visit: Payer: Self-pay | Admitting: Pharmacist

## 2020-03-18 VITALS — BP 113/72 | HR 97 | Temp 98.8°F | Resp 20 | Wt 176.0 lb

## 2020-03-18 DIAGNOSIS — C9000 Multiple myeloma not having achieved remission: Secondary | ICD-10-CM

## 2020-03-18 DIAGNOSIS — Z923 Personal history of irradiation: Secondary | ICD-10-CM | POA: Insufficient documentation

## 2020-03-18 DIAGNOSIS — D509 Iron deficiency anemia, unspecified: Secondary | ICD-10-CM | POA: Insufficient documentation

## 2020-03-18 DIAGNOSIS — Z885 Allergy status to narcotic agent status: Secondary | ICD-10-CM | POA: Diagnosis not present

## 2020-03-18 DIAGNOSIS — Z79899 Other long term (current) drug therapy: Secondary | ICD-10-CM | POA: Diagnosis not present

## 2020-03-18 DIAGNOSIS — C541 Malignant neoplasm of endometrium: Secondary | ICD-10-CM

## 2020-03-18 DIAGNOSIS — Z8542 Personal history of malignant neoplasm of other parts of uterus: Secondary | ICD-10-CM | POA: Insufficient documentation

## 2020-03-18 LAB — CBC WITH DIFFERENTIAL (CANCER CENTER ONLY)
Abs Immature Granulocytes: 0 10*3/uL (ref 0.00–0.07)
Basophils Absolute: 0 10*3/uL (ref 0.0–0.1)
Basophils Relative: 0 %
Eosinophils Absolute: 0 10*3/uL (ref 0.0–0.5)
Eosinophils Relative: 0 %
HCT: 28.2 % — ABNORMAL LOW (ref 36.0–46.0)
Hemoglobin: 9.5 g/dL — ABNORMAL LOW (ref 12.0–15.0)
Immature Granulocytes: 0 %
Lymphocytes Relative: 22 %
Lymphs Abs: 0.5 10*3/uL — ABNORMAL LOW (ref 0.7–4.0)
MCH: 29.5 pg (ref 26.0–34.0)
MCHC: 33.7 g/dL (ref 30.0–36.0)
MCV: 87.6 fL (ref 80.0–100.0)
Monocytes Absolute: 0.3 10*3/uL (ref 0.1–1.0)
Monocytes Relative: 12 %
Neutro Abs: 1.5 10*3/uL — ABNORMAL LOW (ref 1.7–7.7)
Neutrophils Relative %: 66 %
Platelet Count: 41 10*3/uL — ABNORMAL LOW (ref 150–400)
RBC: 3.22 MIL/uL — ABNORMAL LOW (ref 3.87–5.11)
RDW: 16.5 % — ABNORMAL HIGH (ref 11.5–15.5)
WBC Count: 2.2 10*3/uL — ABNORMAL LOW (ref 4.0–10.5)
nRBC: 2.2 % — ABNORMAL HIGH (ref 0.0–0.2)

## 2020-03-18 LAB — CMP (CANCER CENTER ONLY)
ALT: 18 U/L (ref 0–44)
AST: 11 U/L — ABNORMAL LOW (ref 15–41)
Albumin: 4.5 g/dL (ref 3.5–5.0)
Alkaline Phosphatase: 26 U/L — ABNORMAL LOW (ref 38–126)
Anion gap: 8 (ref 5–15)
BUN: 17 mg/dL (ref 8–23)
CO2: 25 mmol/L (ref 22–32)
Calcium: 9.7 mg/dL (ref 8.9–10.3)
Chloride: 104 mmol/L (ref 98–111)
Creatinine: 0.94 mg/dL (ref 0.44–1.00)
GFR, Estimated: >60 mL/min (ref >60)
Glucose, Bld: 98 mg/dL (ref 70–99)
Potassium: 3.6 mmol/L (ref 3.5–5.1)
Sodium: 137 mmol/L (ref 135–145)
Total Bilirubin: 0.3 mg/dL (ref 0.3–1.2)
Total Protein: 6.3 g/dL — ABNORMAL LOW (ref 6.5–8.1)

## 2020-03-18 LAB — LACTATE DEHYDROGENASE: LDH: 166 U/L (ref 98–192)

## 2020-03-18 MED ORDER — XPOVIO (60 MG ONCE WEEKLY) 60 MG PO TBPK: 60.0000 mg | ORAL_TABLET | ORAL | 4 refills | Status: DC

## 2020-03-18 NOTE — Progress Notes (Signed)
Hematology and Oncology Follow Up Visit  Emily Greer [id number] 1950-10-26 70 y.o. 03/18/2020   Principle Diagnosis:  Recurrent lambda light chain myeloma - nl cytogenetics History of recurrent endometrial carcinoma Iron deficiency anemia-blood loss  PastTherapy: Status post second autologous stem cell transplant on 07/24/2014 Maintenance therapy with Pomalidomide/every 2 week Velcade - d/c'ed Radiation therapy for endometrial recurrence - completed 04/20/2015 Pomalyst/Kyprolis 70mg /m2 IV q 2 weeks - s/p cycle #12 - held on 12/26/2017 for hematuria Daratumumab/Pomalyst (1 mg) - status post cycle19 -- d/c on 08/21/2019  Current Therapy: Melflufen 40 mg IV q 4 weeks -- started on 08/27/2019, s/p cycle #2 --  D/c due to FDA removal Selinexor 60 mg po q week -- start on 01/19/2020 -- changed on 03/18/2020 Femara 2.5 mg po q day Xgeva 120 mg subcu every 3 months - next dose in 04/2020 IV iron as indicated   Interim History:  Emily Greer is here today for follow-up.  She is responding to the Selinexor.  Her light chain came down to 13 mg/dL.  This is encouraging.  Unfortunately, the blood counts were taken a hit.  Her platelet count is only 41,000.  Regarding have to decrease the dose of Selinexor to 60 mg weekly.  Her vision is also getting a little bit worse.  I would not think that this would be from the Selinexor.  She may have cataracts.  She will see her ophthalmologist.  She has had no diarrhea.  She says if she does, Imodium helps.  She has had no problems with nausea or vomiting.  She has had no cough.  She has had no rashes.  She is taking the Femara for the endometrial cancer.  This has not been an issue.  Next week, all of her family will be coming in for birthdays.  There are a lot of birthdays in February.  She has had no leg swelling.  Overall, performance status is ECOG 1.    Medications:  Allergies as of 03/18/2020      Reactions    Codeine Nausea Only      Medication List       Accurate as of March 18, 2020  2:34 PM. If you have any questions, ask your nurse or doctor.        STOP taking these medications   LORazepam 0.5 MG tablet Commonly known as: ATIVAN Stopped by: Josph Macho, MD   prochlorperazine 10 MG tablet Commonly known as: COMPAZINE Stopped by: Josph Macho, MD     TAKE these medications   acyclovir 400 MG tablet Commonly known as: ZOVIRAX TAKE 1 TABLET(400 MG) BY MOUTH DAILY   albuterol 108 (90 Base) MCG/ACT inhaler Commonly known as: VENTOLIN HFA Inhale 2 puffs into the lungs every 6 (six) hours as needed for wheezing or shortness of breath. 2 puffs 3 times daily x 5 days then every 6 hours as needed.   amLODipine 10 MG tablet Commonly known as: NORVASC Take 1 tablet (10 mg total) by mouth every morning.   aspirin EC 81 MG tablet Take 81 mg by mouth daily.   CALCIUM 1200+D3 PO Take 1 tablet by mouth daily.   cetirizine 10 MG tablet Commonly known as: ZyrTEC Allergy Take 1 tablet (10 mg total) by mouth at bedtime. What changed: when to take this   daratumumab 400 MG/20ML Commonly known as: DARZALEX Inject into the vein.   hydrochlorothiazide 12.5 MG capsule Commonly known as: MICROZIDE Take 12.5 mg by mouth every  morning.   letrozole 2.5 MG tablet Commonly known as: FEMARA Take 1 tablet (2.5 mg total) by mouth daily.   lidocaine-prilocaine cream Commonly known as: EMLA Apply 1 application topically as needed.   loperamide 2 MG capsule Commonly known as: IMODIUM Take by mouth as needed for diarrhea or loose stools. Reported on 08/19/2015   loratadine 10 MG tablet Commonly known as: CLARITIN Take 10 mg by mouth every morning. Reported on 08/19/2015   metoprolol succinate 50 MG 24 hr tablet Commonly known as: TOPROL-XL Take 1 tablet (50 mg total) by mouth every morning.   montelukast 10 MG tablet Commonly known as: SINGULAIR TAKE 1 TABLET(10 MG) BY  MOUTH AT BEDTIME   ondansetron 8 MG tablet Commonly known as: Zofran Take 1 tablet (8 mg total) by mouth 2 (two) times daily as needed (Nausea or vomiting).   potassium chloride SA 20 MEQ tablet Commonly known as: KLOR-CON TAKE 1 TABLET(20 MEQ) BY MOUTH TWICE DAILY   PROBIOTIC DAILY PO Take by mouth daily.   selinexor 40 MG Tbpk Take 80 mg by mouth once a week.   triamcinolone 55 MCG/ACT Aero nasal inhaler Commonly known as: NASACORT Place 2 sprays into the nose as needed.   triamterene-hydrochlorothiazide 37.5-25 MG tablet Commonly known as: MAXZIDE-25 1 tablet by mouth at bedtime   Vitamin D3 50 MCG (2000 UT) Tabs Take 2 tablets by mouth daily.       Allergies:  Allergies  Allergen Reactions  . Codeine Nausea Only    Past Medical History, Surgical history, Social history, and Family History were reviewed and updated.  Review of Systems: Review of Systems  Constitutional: Negative.   HENT: Negative.   Eyes: Negative.   Respiratory: Negative.   Cardiovascular: Negative.   Gastrointestinal: Negative.   Genitourinary: Negative.   Musculoskeletal: Negative.   Skin: Negative.   Neurological: Negative.   Endo/Heme/Allergies: Negative.   Psychiatric/Behavioral: Negative.       Physical Exam:  weight is 176 lb (79.8 kg). Her temperature is 98.8 F (37.1 C). Her blood pressure is 113/72 and her pulse is 97. Her respiration is 20 and oxygen saturation is 99%.   Wt Readings from Last 3 Encounters:  03/18/20 176 lb (79.8 kg)  02/09/20 181 lb (82.1 kg)  01/22/20 176 lb (79.8 kg)    Physical Exam Vitals reviewed.  HENT:     Head: Normocephalic and atraumatic.  Eyes:     Pupils: Pupils are equal, round, and reactive to light.  Cardiovascular:     Rate and Rhythm: Normal rate and regular rhythm.     Heart sounds: Normal heart sounds.  Pulmonary:     Effort: Pulmonary effort is normal.     Breath sounds: Normal breath sounds.  Abdominal:     General:  Bowel sounds are normal.     Palpations: Abdomen is soft.  Musculoskeletal:        General: No tenderness or deformity. Normal range of motion.     Cervical back: Normal range of motion.  Lymphadenopathy:     Cervical: No cervical adenopathy.  Skin:    General: Skin is warm and dry.     Findings: No erythema or rash.  Neurological:     Mental Status: She is alert and oriented to person, place, and time.  Psychiatric:        Behavior: Behavior normal.        Thought Content: Thought content normal.        Judgment: Judgment normal.  Lab Results  Component Value Date   WBC 2.2 (L) 02/19/2020   HGB 10.0 (L) 02/19/2020   HCT 30.0 (L) 02/19/2020   MCV 87.0 02/19/2020   PLT 52 (L) 02/19/2020   Lab Results  Component Value Date   FERRITIN 2,550 (H) 11/05/2019   IRON 110 11/05/2019   TIBC 328 11/05/2019   UIBC 218 11/05/2019   IRONPCTSAT 33 11/05/2019   Lab Results  Component Value Date   RETICCTPCT 1.6 11/05/2019   RBC 3.45 (L) 02/19/2020   Lab Results  Component Value Date   KPAFRELGTCHN 3.3 02/19/2020   LAMBDASER 129.0 (H) 02/19/2020   KAPLAMBRATIO 0.03 (L) 02/19/2020   Lab Results  Component Value Date   IGGSERUM 424 (L) 02/19/2020   IGA 20 (L) 02/19/2020   IGMSERUM 6 (L) 02/19/2020   Lab Results  Component Value Date   TOTALPROTELP 6.5 02/19/2020   ALBUMINELP 4.1 02/19/2020   A1GS 0.2 02/19/2020   A2GS 0.8 02/19/2020   BETS 0.9 02/19/2020   BETA2SER 0.4 11/23/2014   GAMS 0.4 02/19/2020   MSPIKE Not Observed 02/19/2020   SPEI Comment 11/05/2019     Chemistry      Component Value Date/Time   NA 136 02/19/2020 1450   NA 141 01/10/2017 1115   NA 140 06/21/2016 0918   K 3.4 (L) 02/19/2020 1450   K 4.0 01/10/2017 1115   K 4.3 06/21/2016 0918   CL 101 02/19/2020 1450   CL 106 01/10/2017 1115   CO2 26 02/19/2020 1450   CO2 27 01/10/2017 1115   CO2 20 (L) 06/21/2016 0918   BUN 16 02/19/2020 1450   BUN 15 01/10/2017 1115   BUN 15.8 06/21/2016  0918   CREATININE 0.87 02/19/2020 1450   CREATININE 1.0 01/10/2017 1115   CREATININE 0.8 06/21/2016 0918      Component Value Date/Time   CALCIUM 9.9 02/19/2020 1450   CALCIUM 9.5 01/10/2017 1115   CALCIUM 9.4 06/21/2016 0918   ALKPHOS 26 (L) 02/19/2020 1450   ALKPHOS 40 01/10/2017 1115   ALKPHOS 66 06/21/2016 0918   AST 11 (L) 02/19/2020 1450   AST 17 06/21/2016 0918   ALT 14 02/19/2020 1450   ALT 19 01/10/2017 1115   ALT 37 06/21/2016 0918   BILITOT 0.5 02/19/2020 1450   BILITOT 0.32 06/21/2016 0918       Impression and Plan: Ms. Buckman is a very pleasant 70 yo African American female with recurrent lambda light chain myeloma. Her second stem cell transplant for light chain myeloma was in June 2016.   Shealsohaslocalized recurrent endometrial cancer (followed by gyn onc Dr. Nelly Rout) and is currently on Femara.   Hopefully, we will be able to get her blood counts will be better by decreasing the dose of Selinexor.  Again we will go down to 60 mg a week.  I told her to start the Selinexor on March 8.  I would like to see her back in another month.  She is not due for Xgeva until April.  Overall, her quality of life is doing okay.  We will have to see what happens with her vision.      Josph Macho, MD 2/17/20222:34 PM

## 2020-03-18 NOTE — Telephone Encounter (Signed)
Appointments scheduled calendar printed per 2/17 los 

## 2020-03-19 ENCOUNTER — Ambulatory Visit: Payer: Medicare Other | Admitting: Hematology & Oncology

## 2020-03-19 ENCOUNTER — Other Ambulatory Visit: Payer: Medicare Other

## 2020-03-19 LAB — IRON AND TIBC
Iron: 77 ug/dL (ref 41–142)
Saturation Ratios: 25 % (ref 21–57)
TIBC: 303 ug/dL (ref 236–444)
UIBC: 226 ug/dL (ref 120–384)

## 2020-03-19 LAB — IGG, IGA, IGM
IgA: 27 mg/dL — ABNORMAL LOW (ref 87–352)
IgG (Immunoglobin G), Serum: 389 mg/dL — ABNORMAL LOW (ref 586–1602)
IgM (Immunoglobulin M), Srm: 5 mg/dL — ABNORMAL LOW (ref 26–217)

## 2020-03-19 LAB — KAPPA/LAMBDA LIGHT CHAINS
Kappa free light chain: 4.1 mg/L (ref 3.3–19.4)
Kappa, lambda light chain ratio: 0.03 — ABNORMAL LOW (ref 0.26–1.65)
Lambda free light chains: 160.3 mg/L — ABNORMAL HIGH (ref 5.7–26.3)

## 2020-03-19 LAB — ERYTHROPOIETIN: Erythropoietin: 26.3 m[IU]/mL — ABNORMAL HIGH (ref 2.6–18.5)

## 2020-03-19 LAB — FERRITIN: Ferritin: 2844 ng/mL — ABNORMAL HIGH (ref 11–307)

## 2020-03-22 LAB — PROTEIN ELECTROPHORESIS, SERUM, WITH REFLEX
A/G Ratio: 1.6 (ref 0.7–1.7)
Albumin ELP: 3.9 g/dL (ref 2.9–4.4)
Alpha-1-Globulin: 0.2 g/dL (ref 0.0–0.4)
Alpha-2-Globulin: 0.8 g/dL (ref 0.4–1.0)
Beta Globulin: 1 g/dL (ref 0.7–1.3)
Gamma Globulin: 0.4 g/dL (ref 0.4–1.8)
Globulin, Total: 2.4 g/dL (ref 2.2–3.9)
Total Protein ELP: 6.3 g/dL (ref 6.0–8.5)

## 2020-03-31 ENCOUNTER — Ambulatory Visit (INDEPENDENT_AMBULATORY_CARE_PROVIDER_SITE_OTHER): Payer: Medicare Other | Admitting: Internal Medicine

## 2020-03-31 ENCOUNTER — Encounter: Payer: Self-pay | Admitting: Internal Medicine

## 2020-03-31 ENCOUNTER — Other Ambulatory Visit: Payer: Self-pay

## 2020-03-31 VITALS — BP 118/66 | HR 78 | Temp 98.2°F | Ht 62.0 in | Wt 176.6 lb

## 2020-03-31 DIAGNOSIS — I10 Essential (primary) hypertension: Secondary | ICD-10-CM

## 2020-03-31 DIAGNOSIS — D701 Agranulocytosis secondary to cancer chemotherapy: Secondary | ICD-10-CM

## 2020-03-31 DIAGNOSIS — E559 Vitamin D deficiency, unspecified: Secondary | ICD-10-CM

## 2020-03-31 DIAGNOSIS — Z6832 Body mass index (BMI) 32.0-32.9, adult: Secondary | ICD-10-CM

## 2020-03-31 DIAGNOSIS — L659 Nonscarring hair loss, unspecified: Secondary | ICD-10-CM

## 2020-03-31 DIAGNOSIS — T451X5A Adverse effect of antineoplastic and immunosuppressive drugs, initial encounter: Secondary | ICD-10-CM | POA: Diagnosis not present

## 2020-03-31 DIAGNOSIS — E6609 Other obesity due to excess calories: Secondary | ICD-10-CM

## 2020-03-31 DIAGNOSIS — Z9484 Stem cells transplant status: Secondary | ICD-10-CM | POA: Diagnosis not present

## 2020-03-31 NOTE — Patient Instructions (Signed)

## 2020-03-31 NOTE — Progress Notes (Signed)
I,Katawbba Wiggins,acting as a Neurosurgeon for Gwynneth Aliment, MD.,have documented all relevant documentation on the behalf of Gwynneth Aliment, MD,as directed by  Gwynneth Aliment, MD while in the presence of Gwynneth Aliment, MD.  This visit occurred during the SARS-CoV-2 public health emergency.  Safety protocols were in place, including screening questions prior to the visit, additional usage of staff PPE, and extensive cleaning of exam room while observing appropriate contact time as indicated for disinfecting solutions.  Subjective:     Patient ID: Emily Greer , female    DOB: 24-Sep-1950 , 70 y.o.   MRN: [id number]   Chief Complaint  Patient presents with  . Hypertension    HPI  She presents today for BP f/u. She denies headaches, chest pain and shortness of breath. She reports compliance with meds .  Hypertension This is a chronic problem. The current episode started more than 1 year ago. The problem has been gradually improving since onset. The problem is controlled. Pertinent negatives include no blurred vision, chest pain, palpitations or shortness of breath.     Past Medical History:  Diagnosis Date  . Avascular necrosis of femoral head (HCC)    bilateral per CT 07-26-2015  . Endometrial carcinoma [facility]) gyn oncologist-  dr Nelly Rout ([facility])/  radiation oncologist-- dr Roselind Messier   2013 dx  FIGO Stage 1A, Grade 2 endometrioid endometrial cancer s/p TAH w/ BSO and bilateral pelvic node dissection 10-31-2011 ;  recurrence at distal vagina 04/ 2014 s/p  brachytherapy (ended 07-29-2012);  2nd recurrence 12/ 2016  vaginal apex s/p  conformational radiotherapy 03-10-2015 to 04-20-2015  . Family history of adverse reaction to anesthesia    mother ponv  . GERD (gastroesophageal reflux disease)   . Goals of care, counseling/discussion 08/21/2019  . H/O stem cell transplant (HCC)    02/ 2000 and second one 06/ 2016  . History of bacteremia    staphyloccus epidemidis  bacteremia in 1999 and 05/ 2016  . History of chemotherapy    last chemo 12-26-17  . History of radiation therapy 6/4, 6/11, 6/19, 6/25, 07/29/2012   vagina 30.5 gray in 5 fx, HDR brachytherapy:   last radiation to vagina 03-10-2015 to 04-20-2015  50.4gray  . History of radiation therapy 03/10/15-04/20/15   vagina 50.4 in 28 fractions  . Hypertension   . Lambda light chain myeloma [facility]) oncologist-  dr Myna Hidalgo ([facility])  and  [facility] -- dr Silvestre Mesi gasparetto   dx 07/ 1999 s/p  VAD chemotherapy 11/ 1999,  purged autotransplant 03-21-1998 followed by auto stem cell transplant 03-29-1998;  recurrance w/ second autologous stem cell transplant 07-24-2014;  in Re-mission currently , chemo maintenance therapy  . Osteoporosis 12/18/05   Increased  risk   . PONV (postoperative nausea and vomiting)   . Wears glasses      Family History  Problem Relation Age of Onset  . Colon cancer Mother   . Hypertension Father   . Heart Problems Father      Current Outpatient Medications:  .  albuterol (VENTOLIN HFA) 108 (90 Base) MCG/ACT inhaler, Inhale 2 puffs into the lungs every 6 (six) hours as needed for wheezing or shortness of breath. 2 puffs 3 times daily x 5 days then every 6 hours as needed., Disp: 18 g, Rfl: 11 .  aspirin EC 81 MG tablet, Take 81 mg by mouth daily., Disp: , Rfl:  .  Calcium-Magnesium-Vitamin D (CALCIUM 1200+D3 PO), Take 1 tablet by mouth daily.,  Disp: , Rfl:  .  cetirizine (ZYRTEC ALLERGY) 10 MG tablet, Take 1 tablet (10 mg total) by mouth at bedtime. (Patient taking differently: Take 10 mg by mouth daily.), Disp: 30 tablet, Rfl: 2 .  Cholecalciferol (VITAMIN D3) 2000 UNITS TABS, Take 2 tablets by mouth daily. , Disp: , Rfl:  .  daratumumab (DARZALEX) 400 MG/20ML, Inject into the vein., Disp: , Rfl:  .  hydrochlorothiazide (MICROZIDE) 12.5 MG capsule, Take 12.5 mg by mouth every morning., Disp: , Rfl:  .  letrozole (FEMARA) 2.5 MG tablet, Take 1 tablet (2.5 mg total) by mouth  daily., Disp: 60 tablet, Rfl: 4 .  lidocaine-prilocaine (EMLA) cream, Apply 1 application topically as needed., Disp: 30 g, Rfl: 3 .  loperamide (IMODIUM) 2 MG capsule, Take by mouth as needed for diarrhea or loose stools. Reported on 08/19/2015, Disp: , Rfl:  .  loratadine (CLARITIN) 10 MG tablet, Take 10 mg by mouth every morning. Reported on 08/19/2015, Disp: , Rfl:  .  metoprolol succinate (TOPROL-XL) 50 MG 24 hr tablet, Take 1 tablet (50 mg total) by mouth every morning., Disp: 90 tablet, Rfl: 3 .  ondansetron (ZOFRAN) 8 MG tablet, Take 1 tablet (8 mg total) by mouth 2 (two) times daily as needed (Nausea or vomiting)., Disp: 30 tablet, Rfl: 1 .  potassium chloride SA (KLOR-CON) 20 MEQ tablet, TAKE 1 TABLET(20 MEQ) BY MOUTH TWICE DAILY, Disp: 60 tablet, Rfl: 3 .  Probiotic Product (PROBIOTIC DAILY PO), Take by mouth daily., Disp: , Rfl:  .  selinexor (XPOVIO, 60 MG ONCE WEEKLY,) 60 MG TBPK, Take 60 mg by mouth once a week., Disp: 4 each, Rfl: 4 .  triamcinolone (NASACORT) 55 MCG/ACT AERO nasal inhaler, Place 2 sprays into the nose as needed. , Disp: , Rfl:  .  triamterene-hydrochlorothiazide (MAXZIDE-25) 37.5-25 MG tablet, 1 tablet by mouth at bedtime, Disp: 90 tablet, Rfl: 2 .  acyclovir (ZOVIRAX) 400 MG tablet, TAKE 1 TABLET(400 MG) BY MOUTH TWICE DAILY, Disp: 60 tablet, Rfl: 1 .  amLODipine (NORVASC) 10 MG tablet, TAKE 1 TABLET(10 MG) BY MOUTH EVERY MORNING, Disp: 90 tablet, Rfl: 2 .  montelukast (SINGULAIR) 10 MG tablet, TAKE 1 TABLET(10 MG) BY MOUTH AT BEDTIME, Disp: 90 tablet, Rfl: 2 No current facility-administered medications for this visit.  Facility-Administered Medications Ordered in Other Visits:  .  0.9 %  sodium chloride infusion, , Intravenous, Once PRN, Ennever, Rose Phi, MD .  albuterol (PROVENTIL) (2.5 MG/3ML) 0.083% nebulizer solution 2.5 mg, 2.5 mg, Nebulization, Once PRN, Ennever, Rose Phi, MD .  sodium chloride flush (NS) 0.9 % injection 10 mL, 10 mL, Intravenous, PRN,  [place], Sarah M, NP, 10 mL at 12/26/17 1130 .  sodium chloride flush (NS) 0.9 % injection 10 mL, 10 mL, Intravenous, PRN, Josph Macho, MD, 10 mL at 04/24/19 1305 .  sodium chloride flush (NS) 0.9 % injection 10 mL, 10 mL, Intravenous, PRN, Josph Macho, MD, 10 mL at 08/21/19 1233 .  sodium chloride flush (NS) 0.9 % injection 10 mL, 10 mL, Intracatheter, PRN, [place], Sarah M, NP, 10 mL at 10/01/19 1053 .  sodium chloride flush (NS) 0.9 % injection 10 mL, 10 mL, Intracatheter, PRN, [place], Sarah M, NP, 10 mL at 12/11/19 1414   Allergies  Allergen Reactions  . Codeine Nausea Only     Review of Systems  Constitutional: Negative.   Eyes: Negative for blurred vision.  Respiratory: Negative.  Negative for shortness of breath.   Cardiovascular: Negative.  Negative  for chest pain and palpitations.  Gastrointestinal: Negative.   Skin:       She is concerned about continued hair loss. She would like to start necessary treatments as needed. Denies change in her appetite/sleep habits. No h/o lupus.   Psychiatric/Behavioral: Negative.   All other systems reviewed and are negative.    Today's Vitals   03/31/20 1207  BP: 118/66  Pulse: 78  Temp: 98.2 F (36.8 C)  TempSrc: Oral  Weight: 176 lb 9.6 oz (80.1 kg)  Height: 5\' 2"  (1.575 m)  PainSc: 0-No pain   Body mass index is 32.3 kg/m.  Wt Readings from Last 3 Encounters:  03/31/20 176 lb 9.6 oz (80.1 kg)  03/18/20 176 lb (79.8 kg)  02/09/20 181 lb (82.1 kg)   Objective:  Physical Exam Vitals and nursing note reviewed.  Constitutional:      Appearance: Normal appearance. She is obese.  HENT:     Head: Normocephalic and atraumatic.     Nose:     Comments: Masked     Mouth/Throat:     Comments: Masked  Cardiovascular:     Rate and Rhythm: Normal rate and regular rhythm.     Heart sounds: Normal heart sounds.  Pulmonary:     Effort: Pulmonary effort is normal.     Breath sounds: Normal breath sounds.  Skin:     General: Skin is warm.     Comments: Alopecia is present  Neurological:     General: No focal deficit present.     Mental Status: She is alert and oriented to person, place, and time.         Assessment And Plan:     1. Essential (primary) hypertension Comments: Chronic, well controlled. She is encouraged to follow a low sodium diet. She will c/w current meds.  Previous renal function reviewed in full detail.   2. Hair loss Comments: I will check labs as listed below. She agrees to [facility] referral for further evaluation.  - ANA, IFA (with reflex) - TSH  3. Vitamin D deficiency, unspecified Comments: I will check a vitamin D level and supplement as needed.  - Vitamin D (25 hydroxy)  4. Chemotherapy-induced neutropenia [facility]) Comments: Feb 2022 results revewed in full detail.  WBC ranges from 2.1 to 4.2 in the past several months.   5. Class 1 obesity due to excess calories with serious comorbidity and body mass index (BMI) of 32.0 to 32.9 in adult Comments: She is encouraged to aim for BMI less than 30 to decrease cardiac risk. Advised to aim for at least 150 minutes exercise per week.   6. Stem cells transplant status [facility])  Patient was given opportunity to ask questions. Patient verbalized understanding of the plan and was able to repeat key elements of the plan. All questions were answered to their satisfaction.   I, Gwynneth Aliment, MD, have reviewed all documentation for this visit. The documentation on 04/11/20 for the exam, diagnosis, procedures, and orders are all accurate and complete.  THE PATIENT IS ENCOURAGED TO PRACTICE SOCIAL DISTANCING DUE TO THE COVID-19 PANDEMIC.

## 2020-04-01 ENCOUNTER — Telehealth: Payer: Medicare Other

## 2020-04-05 ENCOUNTER — Other Ambulatory Visit: Payer: Self-pay | Admitting: Internal Medicine

## 2020-04-05 ENCOUNTER — Telehealth: Payer: Medicare Other

## 2020-04-05 DIAGNOSIS — I10 Essential (primary) hypertension: Secondary | ICD-10-CM

## 2020-04-06 ENCOUNTER — Other Ambulatory Visit: Payer: Self-pay

## 2020-04-06 DIAGNOSIS — L659 Nonscarring hair loss, unspecified: Secondary | ICD-10-CM

## 2020-04-06 LAB — FANA STAINING PATTERNS: Nuclear Dot Pattern: 1:80 {titer}

## 2020-04-06 LAB — ANTINUCLEAR ANTIBODIES, IFA: ANA Titer 1: POSITIVE — AB

## 2020-04-06 LAB — VITAMIN D 25 HYDROXY (VIT D DEFICIENCY, FRACTURES): Vit D, 25-Hydroxy: 25.7 ng/mL — ABNORMAL LOW (ref 30.0–100.0)

## 2020-04-06 LAB — TSH: TSH: 1.77 u[IU]/mL (ref 0.450–4.500)

## 2020-04-07 ENCOUNTER — Other Ambulatory Visit: Payer: Self-pay | Admitting: *Deleted

## 2020-04-07 ENCOUNTER — Other Ambulatory Visit: Payer: Self-pay | Admitting: Hematology & Oncology

## 2020-04-07 DIAGNOSIS — C9000 Multiple myeloma not having achieved remission: Secondary | ICD-10-CM

## 2020-04-08 ENCOUNTER — Telehealth: Payer: Medicare Other

## 2020-04-14 ENCOUNTER — Inpatient Hospital Stay (HOSPITAL_BASED_OUTPATIENT_CLINIC_OR_DEPARTMENT_OTHER): Payer: Medicare Other | Admitting: Hematology & Oncology

## 2020-04-14 ENCOUNTER — Inpatient Hospital Stay: Payer: Medicare Other | Attending: Hematology & Oncology

## 2020-04-14 ENCOUNTER — Encounter: Payer: Self-pay | Admitting: Hematology & Oncology

## 2020-04-14 ENCOUNTER — Other Ambulatory Visit: Payer: Self-pay

## 2020-04-14 ENCOUNTER — Inpatient Hospital Stay: Payer: Medicare Other

## 2020-04-14 ENCOUNTER — Telehealth: Payer: Self-pay

## 2020-04-14 VITALS — BP 119/61 | HR 74 | Temp 99.2°F | Resp 18 | Ht 62.0 in | Wt 176.0 lb

## 2020-04-14 DIAGNOSIS — Z8542 Personal history of malignant neoplasm of other parts of uterus: Secondary | ICD-10-CM | POA: Diagnosis not present

## 2020-04-14 DIAGNOSIS — Z923 Personal history of irradiation: Secondary | ICD-10-CM | POA: Diagnosis not present

## 2020-04-14 DIAGNOSIS — C9000 Multiple myeloma not having achieved remission: Secondary | ICD-10-CM

## 2020-04-14 DIAGNOSIS — Z79899 Other long term (current) drug therapy: Secondary | ICD-10-CM | POA: Insufficient documentation

## 2020-04-14 DIAGNOSIS — Z95828 Presence of other vascular implants and grafts: Secondary | ICD-10-CM

## 2020-04-14 DIAGNOSIS — Z885 Allergy status to narcotic agent status: Secondary | ICD-10-CM | POA: Diagnosis not present

## 2020-04-14 DIAGNOSIS — D509 Iron deficiency anemia, unspecified: Secondary | ICD-10-CM | POA: Insufficient documentation

## 2020-04-14 LAB — CBC WITH DIFFERENTIAL (CANCER CENTER ONLY)
Abs Immature Granulocytes: 0.02 10*3/uL (ref 0.00–0.07)
Basophils Absolute: 0 10*3/uL (ref 0.0–0.1)
Basophils Relative: 0 %
Eosinophils Absolute: 0 10*3/uL (ref 0.0–0.5)
Eosinophils Relative: 0 %
HCT: 25.9 % — ABNORMAL LOW (ref 36.0–46.0)
Hemoglobin: 8.7 g/dL — ABNORMAL LOW (ref 12.0–15.0)
Immature Granulocytes: 1 %
Lymphocytes Relative: 19 %
Lymphs Abs: 0.5 10*3/uL — ABNORMAL LOW (ref 0.7–4.0)
MCH: 29.5 pg (ref 26.0–34.0)
MCHC: 33.6 g/dL (ref 30.0–36.0)
MCV: 87.8 fL (ref 80.0–100.0)
Monocytes Absolute: 0.1 10*3/uL (ref 0.1–1.0)
Monocytes Relative: 5 %
Neutro Abs: 2 10*3/uL (ref 1.7–7.7)
Neutrophils Relative %: 75 %
Platelet Count: 59 10*3/uL — ABNORMAL LOW (ref 150–400)
RBC: 2.95 MIL/uL — ABNORMAL LOW (ref 3.87–5.11)
RDW: 16.2 % — ABNORMAL HIGH (ref 11.5–15.5)
WBC Count: 2.7 10*3/uL — ABNORMAL LOW (ref 4.0–10.5)
nRBC: 0 % (ref 0.0–0.2)

## 2020-04-14 LAB — CMP (CANCER CENTER ONLY)
ALT: 13 U/L (ref 0–44)
AST: 12 U/L — ABNORMAL LOW (ref 15–41)
Albumin: 4.5 g/dL (ref 3.5–5.0)
Alkaline Phosphatase: 30 U/L — ABNORMAL LOW (ref 38–126)
Anion gap: 7 (ref 5–15)
BUN: 19 mg/dL (ref 8–23)
CO2: 25 mmol/L (ref 22–32)
Calcium: 9.3 mg/dL (ref 8.9–10.3)
Chloride: 105 mmol/L (ref 98–111)
Creatinine: 0.81 mg/dL (ref 0.44–1.00)
GFR, Estimated: >60 mL/min (ref >60)
Glucose, Bld: 107 mg/dL — ABNORMAL HIGH (ref 70–99)
Potassium: 3.7 mmol/L (ref 3.5–5.1)
Sodium: 137 mmol/L (ref 135–145)
Total Bilirubin: 0.3 mg/dL (ref 0.3–1.2)
Total Protein: 6 g/dL — ABNORMAL LOW (ref 6.5–8.1)

## 2020-04-14 LAB — LACTATE DEHYDROGENASE: LDH: 163 U/L (ref 98–192)

## 2020-04-14 MED ORDER — SODIUM CHLORIDE 0.9% FLUSH
10.0000 mL | INTRAVENOUS | Status: DC | PRN
  Administered 2020-04-14: 10 mL via INTRAVENOUS
  Filled 2020-04-14: qty 10

## 2020-04-14 MED ORDER — HEPARIN SOD (PORK) LOCK FLUSH 100 UNIT/ML IV SOLN
500.0000 [IU] | Freq: Once | INTRAVENOUS | Status: AC
  Administered 2020-04-14: 500 [IU] via INTRAVENOUS
  Filled 2020-04-14: qty 5

## 2020-04-14 NOTE — Telephone Encounter (Signed)
appts made and printed for pt per 04/14/20  [facility]

## 2020-04-14 NOTE — Progress Notes (Signed)
Hematology and Oncology Follow Up Visit  Emily Greer [id number] Apr 18, 1950 70 y.o. 04/14/2020   Principle Diagnosis:  Recurrent lambda light chain myeloma - nl cytogenetics History of recurrent endometrial carcinoma Iron deficiency anemia-blood loss  PastTherapy: Status post second autologous stem cell transplant on 07/24/2014 Maintenance therapy with Pomalidomide/every 2 week Velcade - d/c'ed Radiation therapy for endometrial recurrence - completed 04/20/2015 Pomalyst/Kyprolis 70mg /m2 IV q 2 weeks - s/p cycle #12 - held on 12/26/2017 for hematuria Daratumumab/Pomalyst (1 mg) - status post cycle19 -- d/c on 08/21/2019  Current Therapy: Melflufen 40 mg IV q 4 weeks -- started on 08/27/2019, s/p cycle #2 --  D/c due to FDA removal Selinexor 60 mg po q week -- start on 01/19/2020 -- changed on 03/18/2020 Femara 2.5 mg po q day Xgeva 120 mg subcu every 3 months - next dose in 04/2020 IV iron as indicated   Interim History:  Emily Greer is here today for follow-up.  Emily Greer feels pretty well.  We decreased the dose of Selinexor down to 60 mg a week.  Her white cell count platelet count is coming up slowly.  However, her hemoglobin is down.  Her hemoglobin is 8.7.    Her erythropoietin level back in February was only 26.  As such, we could certainly use all ESA to try to get her hemoglobin up.  Her last lambda light chain was 16 mg/dL.  This was up a little bit from before which was 13 milligrams per deciliter.  Recurrent have to watch this closely.  Emily Greer has had no bleeding.  Emily Greer has had no problems with bowels or bladder.  Emily Greer has had no cough or shortness of breath.  Emily Greer has had no problems with leg swelling.  Her appetite has been pretty good.  Emily Greer and her husband were down in [place], [place] for the [employer].  They had a good time.  Overall, her performance status is ECOG 1.   Medications:  Allergies as of 04/14/2020       Reactions   Codeine Nausea Only      Medication List       Accurate as of April 14, 2020 11:08 AM. If you have any questions, ask your nurse or doctor.        acyclovir 400 MG tablet Commonly known as: ZOVIRAX TAKE 1 TABLET(400 MG) BY MOUTH TWICE DAILY   albuterol 108 (90 Base) MCG/ACT inhaler Commonly known as: VENTOLIN HFA Inhale 2 puffs into the lungs every 6 (six) hours as needed for wheezing or shortness of breath. 2 puffs 3 times daily x 5 days then every 6 hours as needed.   amLODipine 10 MG tablet Commonly known as: NORVASC TAKE 1 TABLET(10 MG) BY MOUTH EVERY MORNING   aspirin EC 81 MG tablet Take 81 mg by mouth daily.   CALCIUM 1200+D3 PO Take 1 tablet by mouth daily.   cetirizine 10 MG tablet Commonly known as: ZyrTEC Allergy Take 1 tablet (10 mg total) by mouth at bedtime. What changed: when to take this   daratumumab 400 MG/20ML Commonly known as: DARZALEX Inject into the vein.   hydrochlorothiazide 12.5 MG capsule Commonly known as: MICROZIDE Take 12.5 mg by mouth every morning.   letrozole 2.5 MG tablet Commonly known as: FEMARA Take 1 tablet (2.5 mg total) by mouth daily.   lidocaine-prilocaine cream Commonly known as: EMLA Apply 1 application topically as needed.   loperamide 2 MG capsule Commonly known as: IMODIUM Take by mouth as  needed for diarrhea or loose stools. Reported on 08/19/2015   loratadine 10 MG tablet Commonly known as: CLARITIN Take 10 mg by mouth every morning. Reported on 08/19/2015   metoprolol succinate 50 MG 24 hr tablet Commonly known as: TOPROL-XL Take 1 tablet (50 mg total) by mouth every morning.   montelukast 10 MG tablet Commonly known as: SINGULAIR TAKE 1 TABLET(10 MG) BY MOUTH AT BEDTIME   ondansetron 8 MG tablet Commonly known as: Zofran Take 1 tablet (8 mg total) by mouth 2 (two) times daily as needed (Nausea or vomiting).   potassium chloride SA 20 MEQ tablet Commonly known as: KLOR-CON TAKE 1  TABLET(20 MEQ) BY MOUTH TWICE DAILY   PROBIOTIC DAILY PO Take by mouth daily.   triamcinolone 55 MCG/ACT Aero nasal inhaler Commonly known as: NASACORT Place 2 sprays into the nose as needed.   triamterene-hydrochlorothiazide 37.5-25 MG tablet Commonly known as: MAXZIDE-25 1 tablet by mouth at bedtime   Vitamin D3 50 MCG (2000 UT) Tabs Take 2 tablets by mouth daily.   Xpovio (60 MG Once Weekly) 60 MG Tbpk Generic drug: selinexor Take 60 mg by mouth once a week.       Allergies:  Allergies  Allergen Reactions  . Codeine Nausea Only    Past Medical History, Surgical history, Social history, and Family History were reviewed and updated.  Review of Systems: Review of Systems  Constitutional: Negative.   HENT: Negative.   Eyes: Negative.   Respiratory: Negative.   Cardiovascular: Negative.   Gastrointestinal: Negative.   Genitourinary: Negative.   Musculoskeletal: Negative.   Skin: Negative.   Neurological: Negative.   Endo/Heme/Allergies: Negative.   Psychiatric/Behavioral: Negative.       Physical Exam:  height is 5\' 2"  (1.575 m) and weight is 176 lb (79.8 kg). Her oral temperature is 99.2 F (37.3 C). Her blood pressure is 119/61 and her pulse is 74. Her respiration is 18 and oxygen saturation is 100%.   Wt Readings from Last 3 Encounters:  04/14/20 176 lb (79.8 kg)  03/31/20 176 lb 9.6 oz (80.1 kg)  03/18/20 176 lb (79.8 kg)    Physical Exam Vitals reviewed.  HENT:     Head: Normocephalic and atraumatic.  Eyes:     Pupils: Pupils are equal, round, and reactive to light.  Cardiovascular:     Rate and Rhythm: Normal rate and regular rhythm.     Heart sounds: Normal heart sounds.  Pulmonary:     Effort: Pulmonary effort is normal.     Breath sounds: Normal breath sounds.  Abdominal:     General: Bowel sounds are normal.     Palpations: Abdomen is soft.  Musculoskeletal:        General: No tenderness or deformity. Normal range of motion.      Cervical back: Normal range of motion.  Lymphadenopathy:     Cervical: No cervical adenopathy.  Skin:    General: Skin is warm and dry.     Findings: No erythema or rash.  Neurological:     Mental Status: Emily Greer is alert and oriented to person, place, and time.  Psychiatric:        Behavior: Behavior normal.        Thought Content: Thought content normal.        Judgment: Judgment normal.      Lab Results  Component Value Date   WBC 2.7 (L) 04/14/2020   HGB 8.7 (L) 04/14/2020   HCT 25.9 (L) 04/14/2020  MCV 87.8 04/14/2020   PLT 59 (L) 04/14/2020   Lab Results  Component Value Date   FERRITIN 2,844 (H) 03/18/2020   IRON 77 03/18/2020   TIBC 303 03/18/2020   UIBC 226 03/18/2020   IRONPCTSAT 25 03/18/2020   Lab Results  Component Value Date   RETICCTPCT 1.6 11/05/2019   RBC 2.95 (L) 04/14/2020   Lab Results  Component Value Date   KPAFRELGTCHN 4.1 03/18/2020   LAMBDASER 160.3 (H) 03/18/2020   KAPLAMBRATIO 0.03 (L) 03/18/2020   Lab Results  Component Value Date   IGGSERUM 389 (L) 03/18/2020   IGA 27 (L) 03/18/2020   IGMSERUM 5 (L) 03/18/2020   Lab Results  Component Value Date   TOTALPROTELP 6.3 03/18/2020   ALBUMINELP 3.9 03/18/2020   A1GS 0.2 03/18/2020   A2GS 0.8 03/18/2020   BETS 1.0 03/18/2020   BETA2SER 0.4 11/23/2014   GAMS 0.4 03/18/2020   MSPIKE Not Observed 03/18/2020   SPEI Comment 11/05/2019     Chemistry      Component Value Date/Time   NA 137 04/14/2020 1000   NA 141 01/10/2017 1115   NA 140 06/21/2016 0918   K 3.7 04/14/2020 1000   K 4.0 01/10/2017 1115   K 4.3 06/21/2016 0918   CL 105 04/14/2020 1000   CL 106 01/10/2017 1115   CO2 25 04/14/2020 1000   CO2 27 01/10/2017 1115   CO2 20 (L) 06/21/2016 0918   BUN 19 04/14/2020 1000   BUN 15 01/10/2017 1115   BUN 15.8 06/21/2016 0918   CREATININE 0.81 04/14/2020 1000   CREATININE 1.0 01/10/2017 1115   CREATININE 0.8 06/21/2016 0918      Component Value Date/Time   CALCIUM 9.3  04/14/2020 1000   CALCIUM 9.5 01/10/2017 1115   CALCIUM 9.4 06/21/2016 0918   ALKPHOS 30 (L) 04/14/2020 1000   ALKPHOS 40 01/10/2017 1115   ALKPHOS 66 06/21/2016 0918   AST 12 (L) 04/14/2020 1000   AST 17 06/21/2016 0918   ALT 13 04/14/2020 1000   ALT 19 01/10/2017 1115   ALT 37 06/21/2016 0918   BILITOT 0.3 04/14/2020 1000   BILITOT 0.32 06/21/2016 0918       Impression and Plan: Emily Greer is a very pleasant 70 yo African American female with recurrent lambda light chain myeloma. Her second stem cell transplant for light chain myeloma was in June 2016.   Shealsohaslocalized recurrent endometrial cancer (followed by gyn onc Dr. Nelly Rout) and is currently on Femara.   I think that this light chain level will be critical.  If it is still had not up, that were going to have to make a change.  However, we will probably going to have to get a bone marrow biopsy on her so we can check her chromosomes and FISH panel.  We will have to watch her hemoglobin closely.  We may have to consider ESA for the anemia.  At least, her white cell count and platelet count are a little bit better.  We will plan to get her back in April.  I would like to see her back after Easter.  I am sure Emily Greer will have her family come in for the Easter weekend.    Josph Macho, MD 3/16/202211:08 AM

## 2020-04-15 ENCOUNTER — Encounter: Payer: Self-pay | Admitting: Internal Medicine

## 2020-04-15 ENCOUNTER — Telehealth: Payer: Self-pay

## 2020-04-15 DIAGNOSIS — H04123 Dry eye syndrome of bilateral lacrimal glands: Secondary | ICD-10-CM | POA: Diagnosis not present

## 2020-04-15 DIAGNOSIS — H25813 Combined forms of age-related cataract, bilateral: Secondary | ICD-10-CM | POA: Diagnosis not present

## 2020-04-15 LAB — IGG, IGA, IGM
IgA: 36 mg/dL — ABNORMAL LOW (ref 87–352)
IgG (Immunoglobin G), Serum: 448 mg/dL — ABNORMAL LOW (ref 586–1602)
IgM (Immunoglobulin M), Srm: <5 mg/dL — ABNORMAL LOW (ref 26–217)

## 2020-04-15 LAB — KAPPA/LAMBDA LIGHT CHAINS
Kappa free light chain: 4.7 mg/L (ref 3.3–19.4)
Kappa, lambda light chain ratio: 0.02 — ABNORMAL LOW (ref 0.26–1.65)
Lambda free light chains: 237.5 mg/L — ABNORMAL HIGH (ref 5.7–26.3)

## 2020-04-15 NOTE — Telephone Encounter (Signed)
The pt was notified to contact Dr. Ledell Peoples office to see if they have any earlier appts.

## 2020-04-19 LAB — IMMUNOFIXATION REFLEX, SERUM
IgA: 37 mg/dL — ABNORMAL LOW (ref 87–352)
IgG (Immunoglobin G), Serum: 501 mg/dL — ABNORMAL LOW (ref 586–1602)
IgM (Immunoglobulin M), Srm: <5 mg/dL — ABNORMAL LOW (ref 26–217)

## 2020-04-19 LAB — PROTEIN ELECTROPHORESIS, SERUM, WITH REFLEX
A/G Ratio: 2 — ABNORMAL HIGH (ref 0.7–1.7)
Albumin ELP: 3.8 g/dL (ref 2.9–4.4)
Alpha-1-Globulin: 0.2 g/dL (ref 0.0–0.4)
Alpha-2-Globulin: 0.6 g/dL (ref 0.4–1.0)
Beta Globulin: 0.8 g/dL (ref 0.7–1.3)
Gamma Globulin: 0.3 g/dL — ABNORMAL LOW (ref 0.4–1.8)
Globulin, Total: 1.9 g/dL — ABNORMAL LOW (ref 2.2–3.9)
SPEP Interpretation: 0
Total Protein ELP: 5.7 g/dL — ABNORMAL LOW (ref 6.0–8.5)

## 2020-04-27 ENCOUNTER — Inpatient Hospital Stay: Payer: Medicare Other

## 2020-04-27 ENCOUNTER — Telehealth: Payer: Self-pay

## 2020-04-27 ENCOUNTER — Telehealth: Payer: Self-pay | Admitting: *Deleted

## 2020-04-27 ENCOUNTER — Inpatient Hospital Stay (HOSPITAL_BASED_OUTPATIENT_CLINIC_OR_DEPARTMENT_OTHER): Payer: Medicare Other | Admitting: Hematology & Oncology

## 2020-04-27 ENCOUNTER — Encounter: Payer: Self-pay | Admitting: Hematology & Oncology

## 2020-04-27 ENCOUNTER — Other Ambulatory Visit: Payer: Self-pay

## 2020-04-27 VITALS — BP 141/82 | HR 100 | Temp 99.2°F | Resp 17 | Wt 171.0 lb

## 2020-04-27 DIAGNOSIS — Z885 Allergy status to narcotic agent status: Secondary | ICD-10-CM | POA: Diagnosis not present

## 2020-04-27 DIAGNOSIS — Z95828 Presence of other vascular implants and grafts: Secondary | ICD-10-CM

## 2020-04-27 DIAGNOSIS — Z79899 Other long term (current) drug therapy: Secondary | ICD-10-CM | POA: Diagnosis not present

## 2020-04-27 DIAGNOSIS — D509 Iron deficiency anemia, unspecified: Secondary | ICD-10-CM | POA: Diagnosis not present

## 2020-04-27 DIAGNOSIS — C9000 Multiple myeloma not having achieved remission: Secondary | ICD-10-CM | POA: Diagnosis not present

## 2020-04-27 DIAGNOSIS — Z923 Personal history of irradiation: Secondary | ICD-10-CM | POA: Diagnosis not present

## 2020-04-27 DIAGNOSIS — Z8542 Personal history of malignant neoplasm of other parts of uterus: Secondary | ICD-10-CM | POA: Diagnosis not present

## 2020-04-27 LAB — CBC WITH DIFFERENTIAL (CANCER CENTER ONLY)
Abs Immature Granulocytes: 0.03 10*3/uL (ref 0.00–0.07)
Basophils Absolute: 0 10*3/uL (ref 0.0–0.1)
Basophils Relative: 0 %
Eosinophils Absolute: 0 10*3/uL (ref 0.0–0.5)
Eosinophils Relative: 0 %
HCT: 27.9 % — ABNORMAL LOW (ref 36.0–46.0)
Hemoglobin: 9.4 g/dL — ABNORMAL LOW (ref 12.0–15.0)
Immature Granulocytes: 1 %
Lymphocytes Relative: 24 %
Lymphs Abs: 0.7 10*3/uL (ref 0.7–4.0)
MCH: 29.1 pg (ref 26.0–34.0)
MCHC: 33.7 g/dL (ref 30.0–36.0)
MCV: 86.4 fL (ref 80.0–100.0)
Monocytes Absolute: 0.3 10*3/uL (ref 0.1–1.0)
Monocytes Relative: 10 %
Neutro Abs: 1.8 10*3/uL (ref 1.7–7.7)
Neutrophils Relative %: 65 %
Platelet Count: 58 10*3/uL — ABNORMAL LOW (ref 150–400)
RBC: 3.23 MIL/uL — ABNORMAL LOW (ref 3.87–5.11)
RDW: 16.3 % — ABNORMAL HIGH (ref 11.5–15.5)
WBC Count: 2.8 10*3/uL — ABNORMAL LOW (ref 4.0–10.5)
nRBC: 0 % (ref 0.0–0.2)

## 2020-04-27 LAB — CMP (CANCER CENTER ONLY)
ALT: 21 U/L (ref 0–44)
AST: 21 U/L (ref 15–41)
Albumin: 4.3 g/dL (ref 3.5–5.0)
Alkaline Phosphatase: 35 U/L — ABNORMAL LOW (ref 38–126)
Anion gap: 11 (ref 5–15)
BUN: 21 mg/dL (ref 8–23)
CO2: 21 mmol/L — ABNORMAL LOW (ref 22–32)
Calcium: 9 mg/dL (ref 8.9–10.3)
Chloride: 104 mmol/L (ref 98–111)
Creatinine: 0.87 mg/dL (ref 0.44–1.00)
GFR, Estimated: >60 mL/min (ref >60)
Glucose, Bld: 105 mg/dL — ABNORMAL HIGH (ref 70–99)
Potassium: 3 mmol/L — ABNORMAL LOW (ref 3.5–5.1)
Sodium: 136 mmol/L (ref 135–145)
Total Bilirubin: 0.3 mg/dL (ref 0.3–1.2)
Total Protein: 7 g/dL (ref 6.5–8.1)

## 2020-04-27 MED ORDER — SODIUM CHLORIDE 0.9% FLUSH
10.0000 mL | Freq: Once | INTRAVENOUS | Status: AC
  Administered 2020-04-27: 10 mL via INTRAVENOUS
  Filled 2020-04-27: qty 10

## 2020-04-27 MED ORDER — HEPARIN SOD (PORK) LOCK FLUSH 100 UNIT/ML IV SOLN
500.0000 [IU] | Freq: Once | INTRAVENOUS | Status: AC
  Administered 2020-04-27: 500 [IU] via INTRAVENOUS
  Filled 2020-04-27: qty 5

## 2020-04-27 MED ORDER — SODIUM CHLORIDE 0.9% FLUSH
10.0000 mL | INTRAVENOUS | Status: DC | PRN
  Administered 2020-04-27: 10 mL via INTRAVENOUS
  Filled 2020-04-27: qty 10

## 2020-04-27 NOTE — Progress Notes (Signed)
Hematology and Oncology Follow Up Visit  Emily Greer [id number] [birth year] 70 y.o. 04/27/2020   Principle Diagnosis:  Recurrent lambda light chain myeloma - nl cytogenetics History of recurrent endometrial carcinoma Iron deficiency anemia-blood loss  PastTherapy: Status post second autologous stem cell transplant on 07/24/2014 Maintenance therapy with Pomalidomide/every 2 week Velcade - d/c'ed Radiation therapy for endometrial recurrence - completed 04/20/2015 Pomalyst/Kyprolis 70mg /m2 IV q 2 weeks - s/p cycle #12 - held on 12/26/2017 for hematuria Daratumumab/Pomalyst (1 mg) - status post cycle19 -- d/c on 08/21/2019  Current Therapy: Melflufen 40 mg IV q 4 weeks -- started on 08/27/2019, s/p cycle #2 --  D/c due to FDA removal Selinexor 60 mg po q week -- start on 01/19/2020 -- changed on 03/18/2020 -- d/c on 04/27/2020 Blenrep 2.5 mg/m2 IV q 3 week -- start cycle #1 on 05/20/2020 Femara 2.5 mg po q day Xgeva 120 mg subcu every 3 months - next dose in 04/2020 IV iron as indicated   Interim History:  Emily Greer is here today for an unscheduled visit.  I want her to come in so I talked her about the fact that her myeloma is progressing.  Unfortunately, with the last light chain that we did on her, this keeps going up.  The lambda light chain was 23.6 mg/dL.  This continues to rise.  She has had problems with the Selinexor.  The blood counts have really had a tough time.  Her white cell count and platelet count are slowly trending upward.  I really think that were going to have to make a change.  I really believe that it would be reasonable to try her on Blenrep.  I think this would be a good idea.  I think she could tolerate this.  She feels well.  I think she will need to have a bone marrow biopsy and aspirate done.  She is not had one done for several years.  I really need to see what her chromosome studies show.  I did talk to her about all  CAR-T therapy.  I think this would be reasonable way to treat her if she does not do well with the Blenrep.  She is not having any problems with pain.  There is no problems with bowels or bladder.  She is having no cough or shortness of breath.  There is no nausea or vomiting.  She is having no issues with rashes.  Overall, performance status continues to be quite good at ECOG 1.    Medications:  Allergies as of 04/27/2020      Reactions   Codeine Nausea Only      Medication List       Accurate as of April 27, 2020  2:06 PM. If you have any questions, ask your nurse or doctor.        STOP taking these medications   daratumumab 400 MG/20ML Commonly known as: DARZALEX Stopped by: Josph Macho, MD     TAKE these medications   acyclovir 400 MG tablet Commonly known as: ZOVIRAX TAKE 1 TABLET(400 MG) BY MOUTH TWICE DAILY   albuterol 108 (90 Base) MCG/ACT inhaler Commonly known as: VENTOLIN HFA Inhale 2 puffs into the lungs every 6 (six) hours as needed for wheezing or shortness of breath. 2 puffs 3 times daily x 5 days then every 6 hours as needed.   amLODipine 10 MG tablet Commonly known as: NORVASC TAKE 1 TABLET(10 MG) BY MOUTH EVERY MORNING   aspirin EC  81 MG tablet Take 81 mg by mouth daily.   CALCIUM 1200+D3 PO Take 1 tablet by mouth daily.   cetirizine 10 MG tablet Commonly known as: ZyrTEC Allergy Take 1 tablet (10 mg total) by mouth at bedtime. What changed: when to take this   hydrochlorothiazide 12.5 MG capsule Commonly known as: MICROZIDE Take 12.5 mg by mouth every morning.   letrozole 2.5 MG tablet Commonly known as: FEMARA Take 1 tablet (2.5 mg total) by mouth daily.   lidocaine-prilocaine cream Commonly known as: EMLA Apply 1 application topically as needed.   loperamide 2 MG capsule Commonly known as: IMODIUM Take by mouth as needed for diarrhea or loose stools. Reported on 08/19/2015   loratadine 10 MG tablet Commonly known as:  CLARITIN Take 10 mg by mouth every morning. Reported on 08/19/2015   metoprolol succinate 50 MG 24 hr tablet Commonly known as: TOPROL-XL Take 1 tablet (50 mg total) by mouth every morning.   montelukast 10 MG tablet Commonly known as: SINGULAIR TAKE 1 TABLET(10 MG) BY MOUTH AT BEDTIME   ondansetron 8 MG tablet Commonly known as: Zofran Take 1 tablet (8 mg total) by mouth 2 (two) times daily as needed (Nausea or vomiting).   potassium chloride SA 20 MEQ tablet Commonly known as: KLOR-CON TAKE 1 TABLET(20 MEQ) BY MOUTH TWICE DAILY   PROBIOTIC DAILY PO Take by mouth daily.   triamcinolone 55 MCG/ACT Aero nasal inhaler Commonly known as: NASACORT Place 2 sprays into the nose as needed.   triamterene-hydrochlorothiazide 37.5-25 MG tablet Commonly known as: MAXZIDE-25 1 tablet by mouth at bedtime   Vitamin D3 50 MCG (2000 UT) Tabs Take 2 tablets by mouth daily.   Xpovio (60 MG Once Weekly) 60 MG Tbpk Generic drug: selinexor Take 60 mg by mouth once a week.       Allergies:  Allergies  Allergen Reactions  . Codeine Nausea Only    Past Medical History, Surgical history, Social history, and Family History were reviewed and updated.  Review of Systems: Review of Systems  Constitutional: Negative.   HENT: Negative.   Eyes: Negative.   Respiratory: Negative.   Cardiovascular: Negative.   Gastrointestinal: Negative.   Genitourinary: Negative.   Musculoskeletal: Negative.   Skin: Negative.   Neurological: Negative.   Endo/Heme/Allergies: Negative.   Psychiatric/Behavioral: Negative.       Physical Exam:  weight is 171 lb (77.6 kg). Her oral temperature is 99.2 F (37.3 C). Her blood pressure is 141/82 (abnormal) and her pulse is 100. Her respiration is 17 and oxygen saturation is 100%.   Wt Readings from Last 3 Encounters:  04/27/20 171 lb (77.6 kg)  04/14/20 176 lb (79.8 kg)  03/31/20 176 lb 9.6 oz (80.1 kg)    Physical Exam Vitals reviewed.  HENT:      Head: Normocephalic and atraumatic.  Eyes:     Pupils: Pupils are equal, round, and reactive to light.  Cardiovascular:     Rate and Rhythm: Normal rate and regular rhythm.     Heart sounds: Normal heart sounds.  Pulmonary:     Effort: Pulmonary effort is normal.     Breath sounds: Normal breath sounds.  Abdominal:     General: Bowel sounds are normal.     Palpations: Abdomen is soft.  Musculoskeletal:        General: No tenderness or deformity. Normal range of motion.     Cervical back: Normal range of motion.  Lymphadenopathy:     Cervical: No  cervical adenopathy.  Skin:    General: Skin is warm and dry.     Findings: No erythema or rash.  Neurological:     Mental Status: She is alert and oriented to person, place, and time.  Psychiatric:        Behavior: Behavior normal.        Thought Content: Thought content normal.        Judgment: Judgment normal.      Lab Results  Component Value Date   WBC 2.8 (L) 04/27/2020   HGB 9.4 (L) 04/27/2020   HCT 27.9 (L) 04/27/2020   MCV 86.4 04/27/2020   PLT 58 (L) 04/27/2020   Lab Results  Component Value Date   FERRITIN 2,844 (H) 03/18/2020   IRON 77 03/18/2020   TIBC 303 03/18/2020   UIBC 226 03/18/2020   IRONPCTSAT 25 03/18/2020   Lab Results  Component Value Date   RETICCTPCT 1.6 11/05/2019   RBC 3.23 (L) 04/27/2020   Lab Results  Component Value Date   KPAFRELGTCHN 4.7 04/14/2020   LAMBDASER 237.5 (H) 04/14/2020   KAPLAMBRATIO 0.02 (L) 04/14/2020   Lab Results  Component Value Date   IGGSERUM 448 (L) 04/14/2020   IGGSERUM 501 (L) 04/14/2020   IGA 36 (L) 04/14/2020   IGA 37 (L) 04/14/2020   IGMSERUM <5 (L) 04/14/2020   IGMSERUM <5 (L) 04/14/2020   Lab Results  Component Value Date   TOTALPROTELP 5.7 (L) 04/14/2020   ALBUMINELP 3.8 04/14/2020   A1GS 0.2 04/14/2020   A2GS 0.6 04/14/2020   BETS 0.8 04/14/2020   BETA2SER 0.4 11/23/2014   GAMS 0.3 (L) 04/14/2020   MSPIKE Not Observed 04/14/2020   SPEI  Comment 11/05/2019     Chemistry      Component Value Date/Time   NA 136 04/27/2020 1145   NA 141 01/10/2017 1115   NA 140 06/21/2016 0918   K 3.0 (L) 04/27/2020 1145   K 4.0 01/10/2017 1115   K 4.3 06/21/2016 0918   CL 104 04/27/2020 1145   CL 106 01/10/2017 1115   CO2 21 (L) 04/27/2020 1145   CO2 27 01/10/2017 1115   CO2 20 (L) 06/21/2016 0918   BUN 21 04/27/2020 1145   BUN 15 01/10/2017 1115   BUN 15.8 06/21/2016 0918   CREATININE 0.87 04/27/2020 1145   CREATININE 1.0 01/10/2017 1115   CREATININE 0.8 06/21/2016 0918      Component Value Date/Time   CALCIUM 9.0 04/27/2020 1145   CALCIUM 9.5 01/10/2017 1115   CALCIUM 9.4 06/21/2016 0918   ALKPHOS 35 (L) 04/27/2020 1145   ALKPHOS 40 01/10/2017 1115   ALKPHOS 66 06/21/2016 0918   AST 21 04/27/2020 1145   AST 17 06/21/2016 0918   ALT 21 04/27/2020 1145   ALT 19 01/10/2017 1115   ALT 37 06/21/2016 0918   BILITOT 0.3 04/27/2020 1145   BILITOT 0.32 06/21/2016 0918       Impression and Plan: Emily Greer is a very pleasant 70 yo African American female with recurrent lambda light chain myeloma. Her second stem cell transplant for light chain myeloma was in June 2016.   Shealsohaslocalized recurrent endometrial cancer (followed by gyn onc Dr. Nelly Rout) and is currently on Femara.   I think that we are going to have to make a change in her protocol.  I think the Blenrep would be a great idea for her.  I really think this would help.  Previously, we did have her on Melflufen previously.  She was responding to this.  Unfortunately, the FDA took this off the market.  Again we will have to get a bone marrow biopsy and aspirate on her.  I think this will be very helpful to see what her chromosome studies are.  I think we can get started after Easter.  I know she has plans for Easter.  Her birthday is [birth year] I want to treat her after her birthday.  I am sure her family will be in town.  I just hate that we are having to  change therapy on her.  In reality, she is done incredibly well.  We first saw her probably about 20 years ago.   I will also get a 24-hour urine on her.  I think this will be helpful.  Her last one was done last year.  We will plan to get her back to see [place] when she starts treatment.  I am happy that her performance status continues to be quite good.    Josph Macho, MD 3/29/20222:06 PM

## 2020-04-27 NOTE — Telephone Encounter (Signed)
Call placed to patient to notify her that Dr. Katy Fitch (pt.'s eye MD) is not authorized to do eye exams for [facility] and that appt with Dr. Bing Plume has been set up for her on Friday, 05/07/20 at 12:30PM at [address].  Pt is appreciative of call and states that this appt will work with her schedule.

## 2020-04-27 NOTE — Progress Notes (Signed)
DISCONTINUE ON PATHWAY REGIMEN - Multiple Myeloma and Other Plasma Cell Dyscrasias     Cycles 1 and 2: A cycle is every 28 days:     Pomalidomide      Dexamethasone      Daratumumab    Cycles 3 through 6: A cycle is every 28 days:     Pomalidomide      Dexamethasone      Dexamethasone      Daratumumab    Cycles 7 and beyond: A cycle is every 28 days:     Pomalidomide      Dexamethasone      Dexamethasone      Daratumumab   **Always confirm dose/schedule in your pharmacy ordering system**  REASON: Disease Progression PRIOR TREATMENT: [id number]: DaraPd (Daratumumab 16 mg/kg + Pomalidomide + Dexamethasone) Until Progression or Unacceptable Toxicity TREATMENT RESPONSE: Stable Disease (SD)  START OFF PATHWAY REGIMEN - Multiple Myeloma and Other Plasma Cell Dyscrasias   [id number]:Belantamab mafodotin 2.5 mg/kg IV D1 q21 Days:   A cycle is every 21 days:     Belantamab mafodotin-blmf   **Always confirm dose/schedule in your pharmacy ordering system**  Patient Characteristics: Multiple Myeloma, Relapsed / Refractory, Second through Fourth Lines of Therapy, Frail or Not a Candidate for Triplet Therapy Disease Classification: Multiple Myeloma R-ISS Staging: Unknown Therapeutic Status: Refractory Line of Therapy: Fourth Line Intent of Therapy: Non-Curative / Palliative Intent, Discussed with Patient

## 2020-04-27 NOTE — Telephone Encounter (Signed)
05/11/20 appts in place and chemo was added to follow md visit, bmbx order noted and [facility] will call the pt to [facility]   Emily Greer

## 2020-04-27 NOTE — Patient Instructions (Signed)
Implanted Port Insertion, Care After This sheet gives you information about how to care for yourself after your procedure. Your health care provider may also give you more specific instructions. If you have problems or questions, contact your health care provider. What can I expect after the procedure? After the procedure, it is common to have:  Discomfort at the port insertion site.  Bruising on the skin over the port. This should improve over 3-4 days. Follow these instructions at home: Port care  After your port is placed, you will get a manufacturer's information card. The card has information about your port. Keep this card with you at all times.  Take care of the port as told by your health care provider. Ask your health care provider if you or a family member can get training for taking care of the port at home. A home health care nurse may also take care of the port.  Make sure to remember what type of port you have. Incision care  Follow instructions from your health care provider about how to take care of your port insertion site. Make sure you: ? Wash your hands with soap and water before and after you change your bandage (dressing). If soap and water are not available, use hand sanitizer. ? Change your dressing as told by your health care provider. ? Leave stitches (sutures), skin glue, or adhesive strips in place. These skin closures may need to stay in place for 2 weeks or longer. If adhesive strip edges start to loosen and curl up, you may trim the loose edges. Do not remove adhesive strips completely unless your health care provider tells you to do that.  Check your port insertion site every day for signs of infection. Check for: ? Redness, swelling, or pain. ? Fluid or blood. ? Warmth. ? Pus or a bad smell.      Activity  Return to your normal activities as told by your health care provider. Ask your health care provider what activities are safe for you.  Do not  lift anything that is heavier than 10 lb (4.5 kg), or the limit that you are told, until your health care provider says that it is safe. General instructions  Take over-the-counter and prescription medicines only as told by your health care provider.  Do not take baths, swim, or use a hot tub until your health care provider approves. Ask your health care provider if you may take showers. You may only be allowed to take sponge baths.  Do not drive for 24 hours if you were given a sedative during your procedure.  Wear a medical alert bracelet in case of an emergency. This will tell any health care providers that you have a port.  Keep all follow-up visits as told by your health care provider. This is important. Contact a health care provider if:  You cannot flush your port with saline as directed, or you cannot draw blood from the port.  You have a fever or chills.  You have redness, swelling, or pain around your port insertion site.  You have fluid or blood coming from your port insertion site.  Your port insertion site feels warm to the touch.  You have pus or a bad smell coming from the port insertion site. Get help right away if:  You have chest pain or shortness of breath.  You have bleeding from your port that you cannot control. Summary  Take care of the port as told by your   health care provider. Keep the manufacturer's information card with you at all times.  Change your dressing as told by your health care provider.  Contact a health care provider if you have a fever or chills or if you have redness, swelling, or pain around your port insertion site.  Keep all follow-up visits as told by your health care provider. This information is not intended to replace advice given to you by your health care provider. Make sure you discuss any questions you have with your health care provider. Document Revised: 08/14/2017 Document Reviewed: 08/14/2017 Elsevier Patient Education   2021 [employer] Inc.  

## 2020-04-27 NOTE — Telephone Encounter (Signed)
S/w pt per secure chat and she is aware of her add on appts today   [name]

## 2020-04-28 LAB — IGG, IGA, IGM
IgA: 37 mg/dL — ABNORMAL LOW (ref 87–352)
IgG (Immunoglobin G), Serum: 498 mg/dL — ABNORMAL LOW (ref 586–1602)
IgM (Immunoglobulin M), Srm: <5 mg/dL — ABNORMAL LOW (ref 26–217)

## 2020-04-28 LAB — FERRITIN: Ferritin: 2472 ng/mL — ABNORMAL HIGH (ref 11–307)

## 2020-04-28 LAB — KAPPA/LAMBDA LIGHT CHAINS
Kappa free light chain: 4.5 mg/L (ref 3.3–19.4)
Kappa, lambda light chain ratio: 0.02 — ABNORMAL LOW (ref 0.26–1.65)
Lambda free light chains: 253.7 mg/L — ABNORMAL HIGH (ref 5.7–26.3)

## 2020-04-28 LAB — IRON AND TIBC
Iron: 98 ug/dL (ref 41–142)
Saturation Ratios: 27 % (ref 21–57)
TIBC: 361 ug/dL (ref 236–444)
UIBC: 263 ug/dL (ref 120–384)

## 2020-04-29 LAB — PROTEIN ELECTROPHORESIS, SERUM, WITH REFLEX
A/G Ratio: 1.6 (ref 0.7–1.7)
Albumin ELP: 4.1 g/dL (ref 2.9–4.4)
Alpha-1-Globulin: 0.2 g/dL (ref 0.0–0.4)
Alpha-2-Globulin: 1 g/dL (ref 0.4–1.0)
Beta Globulin: 1 g/dL (ref 0.7–1.3)
Gamma Globulin: 0.5 g/dL (ref 0.4–1.8)
Globulin, Total: 2.6 g/dL (ref 2.2–3.9)
Total Protein ELP: 6.7 g/dL (ref 6.0–8.5)

## 2020-04-29 LAB — HM DIABETES EYE EXAM

## 2020-05-05 ENCOUNTER — Ambulatory Visit (INDEPENDENT_AMBULATORY_CARE_PROVIDER_SITE_OTHER): Payer: Medicare Other

## 2020-05-05 ENCOUNTER — Telehealth: Payer: Medicare Other

## 2020-05-05 DIAGNOSIS — I10 Essential (primary) hypertension: Secondary | ICD-10-CM | POA: Diagnosis not present

## 2020-05-05 DIAGNOSIS — E6609 Other obesity due to excess calories: Secondary | ICD-10-CM

## 2020-05-05 DIAGNOSIS — Z6833 Body mass index (BMI) 33.0-33.9, adult: Secondary | ICD-10-CM

## 2020-05-05 DIAGNOSIS — C9000 Multiple myeloma not having achieved remission: Secondary | ICD-10-CM

## 2020-05-07 ENCOUNTER — Ambulatory Visit (HOSPITAL_COMMUNITY): Payer: Medicare Other

## 2020-05-07 DIAGNOSIS — H5202 Hypermetropia, left eye: Secondary | ICD-10-CM | POA: Diagnosis not present

## 2020-05-07 DIAGNOSIS — H35033 Hypertensive retinopathy, bilateral: Secondary | ICD-10-CM | POA: Diagnosis not present

## 2020-05-07 DIAGNOSIS — H25813 Combined forms of age-related cataract, bilateral: Secondary | ICD-10-CM | POA: Diagnosis not present

## 2020-05-10 NOTE — Patient Instructions (Signed)
Goals Addressed    . Infection Prevention or Managed   On track    Timeframe:  Short-Term Goal Priority:  High Start Date:  01/13/20                           Expected End Date: 08/13/20   Follow up date: 08/13/20   Over the next 45 days, patient will:  - notify MD if upper respiratory symptoms worsen or persist - stay well hydrated  - get plenty of rest - eat a healthy diet - avoid being around others who may be ill                        . Keep Pain Under Control-Cancer Treatment   On track    Timeframe:  Long-Range Goal Priority:  High Start Date: 01/13/20                            Expected End Date: 08/13/20                      Follow Up Date: 08/13/20   - call for prescription refill 3 days before needed - develop a personal pain management plan - plan exercise or activity when pain is best controlled - prioritize tasks for the day    Why is this important?    Day-to-day life can be hard when you have pain.   Even a small change in emotion or a physical problem can make pain better or worse.   Coping with pain depends on how the mind and body reacts to pain.   Pain medicine is just one piece of the treatment puzzle.   There are many tools to help manage pain. A combination of them can be used to best meet your needs.     Notes:     [name] Kitchen Make and Keep All Appointments   On track    Timeframe:  Long-Range Goal Priority:  High Start Date:  01/13/20                          Expected End Date:  08/13/20                      Follow Up Date: 08/13/20   - ask family or friend for a ride - call to cancel if needed - keep a calendar with appointment dates    Why is this important?    Part of staying healthy is seeing the doctor for follow-up care.   If you forget your appointments, there are some things you can do to stay on track.    Notes:     [name] Kitchen Manage Fatigue (Tiredness- Cancer Treatment)   On track    Timeframe:  Long-Range Goal Priority:  High Start Date:  01/13/20                            Expected End Date: 08/13/20                   Follow Up Date: 08/13/20    - eat healthy - get a least 8 hours of sleep at night - maintain healthy weight - take a warm shower or bath before bed - use meditation or relaxation techniques  Why is this important?   Cancer treatment and its side effects can drain your energy. It can keep you from doing things you would like to do.  There are many things that you can do to manage fatigue.    Notes:     [name] Kitchen Matintain My Quality of Life   On track    Timeframe:  Long-Range Goal Priority:  High Start Date:  01/13/20                           Expected End Date:  08/13/20                  Follow Up Date: 08/13/20   - discuss my treatment options with the doctor or nurse - make shared treatment decisions with doctor - name a health care proxy (decision maker)    Why is this important?    Having a long-term illness can be scary.   It can also be stressful for you and your caregiver.   These steps may help.    Notes:     . Track and Manage My Blood Pressure-Hypertension   On track    Timeframe:  Long-Range Goal Priority:  High Start Date:  01/13/20                           Expected End Date:  08/13/20                      Follow Up Date: 08/13/20    - check blood pressure 3 times per week - write blood pressure results in a log or diary    Why is this important?    You won't feel high blood pressure, but it can still hurt your blood vessels.   High blood pressure can cause heart or kidney problems. It can also cause a stroke.   Making lifestyle changes like losing a Luiza Carranco weight or eating less salt will help.   Checking your blood pressure at home and at different times of the day can help to control blood pressure.   If the doctor prescribes medicine remember to take it the way the doctor ordered.   Call the office if you cannot afford the medicine or if there are questions about it.      Notes:

## 2020-05-10 NOTE — Chronic Care Management (AMB) (Signed)
Chronic Care Management   CCM RN Visit Note  05/05/2020 Name: Emily Greer MRN: [id number] DOB: [birth year]  Subjective: Emily Greer is a 70 y.o. year old female who is a primary care patient of [name], MD. The care management team was consulted for assistance with disease management and care coordination needs.    Engaged with patient by telephone for follow up visit in response to provider referral for case management and/or care coordination services.   Consent to Services:  The patient was given information about Chronic Care Management services, agreed to services, and gave verbal consent prior to initiation of services.  Please see initial visit note for detailed documentation.   Patient agreed to services and verbal consent obtained.   Assessment: Review of patient past medical history, allergies, medications, health status, including review of consultants reports, laboratory and other test data, was performed as part of comprehensive evaluation and provision of chronic care management services.   SDOH (Social Determinants of Health) assessments and interventions performed:  Yes, no acute challenges  CCM Care Plan  Allergies  Allergen Reactions  . Codeine Nausea Only    Outpatient Encounter Medications as of 05/05/2020  Medication Sig  . [START ON 05/20/2020] belantamab mafodotin-blmf 2.5 mg/kg in sodium chloride 0.9 % 250 mL Inject 2.5 mg/kg into the vein once a week. Blenrep 2.5 mg/m2 IV q 3 week -- start cycle #1 on 05/20/2020  . acyclovir (ZOVIRAX) 400 MG tablet TAKE 1 TABLET(400 MG) BY MOUTH TWICE DAILY  . albuterol (VENTOLIN HFA) 108 (90 Base) MCG/ACT inhaler Inhale 2 puffs into the lungs every 6 (six) hours as needed for wheezing or shortness of breath. 2 puffs 3 times daily x 5 days then every 6 hours as needed.  [name] Kitchen amLODipine (NORVASC) 10 MG tablet TAKE 1 TABLET(10 MG) BY MOUTH EVERY MORNING  . aspirin EC 81 MG tablet Take 81 mg by mouth daily.  .  Calcium-Magnesium-Vitamin D (CALCIUM 1200+D3 PO) Take 1 tablet by mouth daily.  . cetirizine (ZYRTEC ALLERGY) 10 MG tablet Take 1 tablet (10 mg total) by mouth at bedtime. (Patient taking differently: Take 10 mg by mouth daily.)  . Cholecalciferol (VITAMIN D3) 2000 UNITS TABS Take 2 tablets by mouth daily.   . hydrochlorothiazide (MICROZIDE) 12.5 MG capsule Take 12.5 mg by mouth every morning.  [name] Kitchen letrozole (FEMARA) 2.5 MG tablet Take 1 tablet (2.5 mg total) by mouth daily.  [name] Kitchen lidocaine-prilocaine (EMLA) cream Apply 1 application topically as needed.  . loperamide (IMODIUM) 2 MG capsule Take by mouth as needed for diarrhea or loose stools. Reported on 08/19/2015  . loratadine (CLARITIN) 10 MG tablet Take 10 mg by mouth every morning. Reported on 08/19/2015  . metoprolol succinate (TOPROL-XL) 50 MG 24 hr tablet Take 1 tablet (50 mg total) by mouth every morning.  . montelukast (SINGULAIR) 10 MG tablet TAKE 1 TABLET(10 MG) BY MOUTH AT BEDTIME  . ondansetron (ZOFRAN) 8 MG tablet Take 1 tablet (8 mg total) by mouth 2 (two) times daily as needed (Nausea or vomiting). (Patient not taking: Reported on 04/27/2020)  . potassium chloride SA (KLOR-CON) 20 MEQ tablet TAKE 1 TABLET(20 MEQ) BY MOUTH TWICE DAILY  . Probiotic Product (PROBIOTIC DAILY PO) Take by mouth daily.  [name] Kitchen triamcinolone (NASACORT) 55 MCG/ACT AERO nasal inhaler Place 2 sprays into the nose as needed.   . triamterene-hydrochlorothiazide (MAXZIDE-25) 37.5-25 MG tablet 1 tablet by mouth at bedtime  . [DISCONTINUED] selinexor (XPOVIO, 60 MG ONCE WEEKLY,) 60 MG TBPK Take  60 mg by mouth once a week.   Facility-Administered Encounter Medications as of 05/05/2020  Medication  . 0.9 %  sodium chloride infusion  . albuterol (PROVENTIL) (2.5 MG/3ML) 0.083% nebulizer solution 2.5 mg  . sodium chloride flush (NS) 0.9 % injection 10 mL  . sodium chloride flush (NS) 0.9 % injection 10 mL  . sodium chloride flush (NS) 0.9 % injection 10 mL  . sodium chloride  flush (NS) 0.9 % injection 10 mL  . sodium chloride flush (NS) 0.9 % injection 10 mL    Patient Active Problem List   Diagnosis Date Noted  . Goals of care, counseling/discussion 08/21/2019  . Osteonecrosis, unspecified (HCC) 03/31/2019  . VAIN I (vaginal intraepithelial neoplasia grade I) 10/10/2018  . Influenza B 02/18/2018  . Flu-like symptoms 02/18/2018  . Excessive cerumen in both ear canals 02/18/2018  . Abnormal Pap smear of vagina 12/30/2015  . 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 Iron deficiency anemia due to chronic blood loss   . HCAP (healthcare-associated pneumonia) 12/29/2014  . UTI (lower urinary tract infection) 12/29/2014  . Hypokalemia 12/29/2014  . CAP (community acquired pneumonia) 12/29/2014  . Bone marrow transplant status (HCC) 07/25/2014  . S/P autologous bone marrow transplantation (HCC) 07/25/2014  . Fever 05/31/2014  . ARF (acute renal failure) (HCC) 05/31/2014  . Anemia 05/31/2014  . Renal failure   . Examination of participant in clinical trial 05/19/2014  . Encounter for examination for normal comparison or control in clinical research program 05/19/2014  . Kahler disease (HCC) 05/05/2014  . Multiple myeloma in remission (HCC) 05/05/2014  . History of radiation therapy   . Endometrial carcinoma (HCC) 09/28/2011  . Pregnancy induced hypertension   . Elevated hemoglobin A1c   . Myeloma (HCC) 01/17/2011  . Lambda light chain myeloma (HCC) 11/11/2007  . Vaginal atrophy 12/19/2006  . Increased BMI 12/18/2005  . Hypertension 11/28/1997  . H/O multiple myeloma 11/28/1997  . Staphylococcus aureus bacteremia 11/28/1997  . Staphylococcus epidermidis bacteremia 11/28/1997  . Essential (primary) hypertension 11/28/1997    Conditions to be addressed/monitored:"Discussed plans with patient for ongoing care management follow up and provided patient with direct contact information for care management  team  Care Plan : General Plan of Care (Adult)  Updates made by Riley Churches, RN since 05/10/2020 12:00 AM    Problem: Quality of Life (General Plan of Care)   Priority: High    Long-Range Goal: Quality of Life Maintained   Start Date: 01/13/2020  Expected End Date: 08/13/2020  Recent Progress: On track  Priority: High  Note:   Current Barriers:   Ineffective Self Health Maintenance  Currently UNABLE TO independently self manage needs related to chronic health conditions.   Caregiver for spouse and elderly mother   Knowledge Deficits related to short term plan for care coordination needs and long term plans for chronic disease management needs Nurse Case Manager Clinical Goal(s):   Over the next 7 months, patient will work with care management team to address care coordination and chronic disease management needs related to Disease Management  Educational Needs  Care Coordination  Medication Management and Education  Psychosocial Support  Caregiver Stress support   Interventions:   Assessed patient's thoughts about quality of life, goals and expectations, and dissatisfaction or desire to improve.   Identified issues of primary importance such as mental health, illness, exercise tolerance, pain, sexual function and intimacy, cognitive change, social isolation, finances and relationships.  Promoted access to services in the community to support independence such as support groups, home visiting programs, financial assistance, handicapped parking tags, durable medical equipment and emergency responder.  Patient Goals/Self Care Activities: - call for prescription refill 3-7 days before needed - develop a personal pain management plan - plan exercise or activity when pain is best controlled - prioritize tasks for the day - keep a calendar with appointment dates- discuss my treatment options with the doctor or nurse - make shared treatment decisions with doctor - name a  health care proxy (decision maker)  Follow Up Plan: Telephone follow up appointment with care management team member scheduled for: 08/13/20   Care Plan : Cancer Treatment Phase (Adult)  Updates made by Riley Churches, RN since 05/10/2020 12:00 AM    Problem: Fatigue   Priority: High    Long-Range Goal: Fatigue Managed   Start Date: 01/13/2020  Expected End Date: 08/13/2020  Recent Progress: On track  Priority: High  Note:   Current Barriers:   Ineffective Self Health Maintenance  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   Knowledge Deficits related to short term plan for care coordination needs and long term plans for chronic disease management needs Nurse Case Manager Clinical Goal(s):   Over the next 7 months patient will work with care management team to address care coordination and chronic disease management needs related to Disease Management  Educational Needs  Care Coordination  Medication Management and Education  Psychosocial Support  Caregiver Stress support   Interventions:   Screened for presence of fatigue using a validated scale, quantitative (1 to 10) or word scale (mild, moderate, severe) at initial visit, at regular intervals and following treatment.   Assessed for causative factors, such as chemotherapy or radiation therapy, onset, pattern, duration, change over time, alleviating and contributing factors, as well as degree of interference with daily function.   Encouraged physical activity, as appropriate, both aerobic and strength-building; consider beginning with low-level activities and increase as conditioning and confidence improves.   Assessed degree of interference with daily function, presence of treatment effects such as anemia, bone metastases, fever or infection, history or risk of falls and conditioning level.   Offered reassurance that  treatment-related fatigue is not necessarily an indicator of disease progression.  Patient Goals/Self Care Activities:  - eat healthy - get a least 8 hours of sleep at night - maintain healthy weight - take a warm shower or bath before bed - use meditation or relaxation techniques  Follow Up Plan: Telephone follow up appointment with care management team member scheduled for: 08/13/20   Problem: Infection   Priority: High    Long-Range Goal: Infection Prevented or Managed   Start Date: 01/13/2020  Expected End Date: 08/13/2020  Recent Progress: On track  Priority: High  Note:   Current Barriers:   Ineffective Self Health Maintenance  Caregiver for spouse and elderly mother   Currently UNABLE TO independently self manage needs related to chronic health conditions.   Knowledge Deficits related to short term plan for care coordination needs and long term plans for chronic disease management needs Nurse Case Manager Clinical Goal(s):   Over the next 7 months, patient will work with care management team to address care coordination and chronic disease management needs related to Disease Management  Educational Needs  Care Coordination  Medication Management and Education  Psychosocial Support  Caregiver Stress support  Interventions:  05/05/20 completed successful outbound call to patient  . Assessed for ongoing Cancer treatment per Oncology . Reviewed and discussed the following Assessment/Plan completed on 04/27/20:  Principle Diagnosis:  Recurrent lambda light chain myeloma - nl cytogenetics History of recurrent endometrial carcinoma Iron deficiency anemia -blood loss Past Therapy:             Status post second autologous stem cell transplant on 07/24/2014 Maintenance therapy with Pomalidomide/every 2 week Velcade - d/c'ed Radiation therapy for endometrial recurrence - completed 04/20/2015 Pomalyst/Kyprolis 70mg /m2 IV q 2 weeks - s/p cycle #12 - held on 12/26/2017 for  hematuria Daratumumab/Pomalyst (1 mg) - status post cycle 19 -- d/c on 08/21/2019 Current Therapy:        Melflufen 40 mg IV q 4 weeks -- started on 08/27/2019, s/p cycle #2 --  D/c due to FDA removal Selinexor 60 mg po q week -- start on 01/19/2020 -- changed on 03/18/2020 -- d/c on 04/27/2020 Blenrep 2.5 mg/m2 IV q 3 week -- start cycle #1 on 05/20/2020 Femara 2.5 mg po q day  Xgeva 120 mg subcu every 3 months - next dose in 04/2020 IV iron as indicated         Interim History:  Ms. Rennie is here today for an unscheduled visit.  I want her to come in so I talked her about the fact that her myeloma is progressing.  Unfortunately, with the last light chain that we did on her, this keeps going up.  The lambda light chain was 23.6 mg/dL.  This continues to rise. She has had problems with the Selinexor.  The blood counts have really had a tough time.  Her white cell count and platelet count are slowly trending upward. I really think that were going to have to make a change.  I really believe that it would be reasonable to try her on Blenrep.  I think this would be a good idea.  I think she could tolerate this. She feels well. I think she will need to have a bone marrow biopsy and aspirate done.  She is not had one done for several years.  I really need to see what her chromosome studies show. I did talk to her about all CAR-T therapy.  I think this would be reasonable way to treat her if she does not do well with the Blenrep. She is not having any problems with pain.  There is no problems with bowels or bladder.  She is having no cough or shortness of breath.  There is no nausea or vomiting.  She is having no issues with rashes. Overall, performance status continues to be quite good at ECOG 1.   . Review of patient status, including review of consultants reports, relevant laboratory and other test results, and medications completed. . Reviewed medications with patient and discussed importance of  medication adherence . Reviewed scheduled/upcoming provider appointments including: next PCP follow up  . Determined patient has a good understanding of her prescribed treatment plan related to her Cancer treatment . Discussed plans with patient for ongoing care management follow up and provided patient with direct contact information for care management team Patient Goals/Self Care Activities:   - notify MD of any/all symptoms suggestive of infection promptly - stay well hydrated  - get plenty of rest - eat a healthy diet - avoid being around others who may be ill   Follow Up Plan: Telephone follow up appointment with care management team member scheduled  for: 08/13/20   Care Plan : Hypertension (Adult)  Updates made by Riley Churches, RN since 05/10/2020 12:00 AM    Problem: Disease Progression (Hypertension)   Priority: High    Long-Range Goal: Disease Progression Prevented or Minimized   Start Date: 01/13/2020  Expected End Date: 08/13/2020  Recent Progress: On track  Priority: High  Note:   Objective:  . Last practice recorded BP readings:  BP Readings from Last 3 Encounters:  12/22/19 118/76  12/11/19 (!) 141/80  11/05/19 130/71 .   [name] Kitchen Most recent eGFR/CrCl:  Lab Results  Component Value Date   EGFR 86 (L) 06/21/2016 .    No components found for: CRCL Current Barriers:  [name] Kitchen Knowledge Deficits related to basic understanding of hypertension pathophysiology and self care management . Caregiver for spouse and elderly mother  Case Manager Clinical Goal(s):  [name] Kitchen Over the next 7 months, patient will demonstrate improved adherence to prescribed treatment plan for hypertension as evidenced by taking all medications as prescribed, monitoring and recording blood pressure as directed, adhering to low sodium/DASH diet Interventions:  . Evaluation of current treatment plan related to hypertension self management and patient's adherence to plan as established by provider. . Provided  education to patient re: stroke prevention, s/s of heart attack and stroke, DASH diet, complications of uncontrolled blood pressure . Reviewed medications with patient and discussed importance of compliance . Discussed plans with patient for ongoing care management follow up and provided patient with direct contact information for care management team . Advised patient, providing education and rationale, to monitor blood pressure daily and record, calling PCP for findings outside established parameters.  Patient Goals/Self-Care Activities - Self administers medications as prescribed Attends all scheduled provider appointments Calls provider office for new concerns, questions, or BP outside discussed parameters Checks BP and records as discussed Follows a low sodium diet/DASH diet - check blood pressure 3 times per week - write blood pressure results in a log or diary  Follow Up Plan: Telephone follow up appointment with care management team member scheduled for: 08/13/20    Plan:Telephone follow up appointment with care management team member scheduled for:  08/16/20   Delsa Sale, RN, BSN, CCM Care Management Coordinator [facility] Care Management/[facility]  Direct Phone: [phone]

## 2020-05-13 ENCOUNTER — Inpatient Hospital Stay: Payer: Medicare Other | Attending: Hematology & Oncology

## 2020-05-13 DIAGNOSIS — D61818 Other pancytopenia: Secondary | ICD-10-CM | POA: Insufficient documentation

## 2020-05-13 DIAGNOSIS — C9000 Multiple myeloma not having achieved remission: Secondary | ICD-10-CM | POA: Insufficient documentation

## 2020-05-13 DIAGNOSIS — Z8542 Personal history of malignant neoplasm of other parts of uterus: Secondary | ICD-10-CM | POA: Diagnosis not present

## 2020-05-13 DIAGNOSIS — D509 Iron deficiency anemia, unspecified: Secondary | ICD-10-CM | POA: Diagnosis not present

## 2020-05-13 DIAGNOSIS — M7989 Other specified soft tissue disorders: Secondary | ICD-10-CM | POA: Insufficient documentation

## 2020-05-13 DIAGNOSIS — Z885 Allergy status to narcotic agent status: Secondary | ICD-10-CM | POA: Insufficient documentation

## 2020-05-13 DIAGNOSIS — Z79899 Other long term (current) drug therapy: Secondary | ICD-10-CM | POA: Insufficient documentation

## 2020-05-13 DIAGNOSIS — Z5112 Encounter for antineoplastic immunotherapy: Secondary | ICD-10-CM | POA: Diagnosis not present

## 2020-05-13 DIAGNOSIS — Z923 Personal history of irradiation: Secondary | ICD-10-CM | POA: Diagnosis not present

## 2020-05-13 NOTE — Progress Notes (Signed)
Pharmacist Chemotherapy Monitoring - Initial Assessment    Anticipated start date: 05/20/20  Regimen:  . Are orders appropriate based on the patient's diagnosis, regimen, and cycle? Yes . Does the plan date match the patient's scheduled date? Yes . Is the sequencing of drugs appropriate? Yes . Are the premedications appropriate for the patient's regimen? Yes . Prior Authorization for treatment is: Approved o If applicable, is the correct biosimilar selected based on the patient's insurance? not applicable  Organ Function and Labs: [name] Kitchen Are dose adjustments needed based on the patient's renal function, hepatic function, or hematologic function? No . Are appropriate labs ordered prior to the start of patient's treatment? Yes . Other organ system assessment, if indicated: N/A . The following baseline labs, if indicated, have been ordered: N/A  Dose Assessment: . Are the drug doses appropriate? Yes . Are the following correct: o Drug concentrations Yes o IV fluid compatible with drug Yes o Administration routes Yes o Timing of therapy Yes . If applicable, does the patient have documented access for treatment and/or plans for port-a-cath placement? not applicable (yes) . If applicable, have lifetime cumulative doses been properly documented and assessed? not applicable Lifetime Dose Tracking  No doses have been documented on this patient for the following tracked chemicals: Doxorubicin, Epirubicin, Idarubicin, Daunorubicin, Mitoxantrone, Bleomycin, Oxaliplatin, Carboplatin, Liposomal Doxorubicin  o   Toxicity Monitoring/Prevention: . The patient has the following take home antiemetics prescribed: Ondansetron, Prochlorperazine and Lorazepam . The patient has the following take home medications prescribed: N/A . Medication allergies and previous infusion related reactions, if applicable, have been reviewed and addressed. Yes . The patient's current medication list has been assessed for  drug-drug interactions with their chemotherapy regimen. no significant drug-drug interactions were identified on review.  Order Review: . Are the treatment plan orders signed? Yes . Is the patient scheduled to see a provider prior to their treatment? Yes  I verify that I have reviewed each item in the above checklist and answered each question accordingly.  Claybon Jabs, [facility], 05/13/2020  12:57 PM

## 2020-05-14 ENCOUNTER — Other Ambulatory Visit: Payer: Self-pay | Admitting: Radiology

## 2020-05-17 ENCOUNTER — Other Ambulatory Visit: Payer: Self-pay

## 2020-05-17 ENCOUNTER — Encounter (HOSPITAL_COMMUNITY): Payer: Self-pay

## 2020-05-17 ENCOUNTER — Ambulatory Visit (HOSPITAL_COMMUNITY)
Admission: RE | Admit: 2020-05-17 | Discharge: 2020-05-17 | Disposition: A | Discharge status: Home / Self Care | Payer: Medicare Other | Source: Ambulatory Visit | Attending: Hematology & Oncology | Admitting: Hematology & Oncology

## 2020-05-17 DIAGNOSIS — Z9221 Personal history of antineoplastic chemotherapy: Secondary | ICD-10-CM | POA: Insufficient documentation

## 2020-05-17 DIAGNOSIS — Z923 Personal history of irradiation: Secondary | ICD-10-CM | POA: Insufficient documentation

## 2020-05-17 DIAGNOSIS — D61818 Other pancytopenia: Secondary | ICD-10-CM | POA: Diagnosis not present

## 2020-05-17 DIAGNOSIS — Z800 Family history of malignant neoplasm of digestive organs: Secondary | ICD-10-CM | POA: Insufficient documentation

## 2020-05-17 DIAGNOSIS — C9000 Multiple myeloma not having achieved remission: Secondary | ICD-10-CM | POA: Diagnosis not present

## 2020-05-17 DIAGNOSIS — Z8589 Personal history of malignant neoplasm of other organs and systems: Secondary | ICD-10-CM | POA: Diagnosis not present

## 2020-05-17 DIAGNOSIS — D47Z9 Other specified neoplasms of uncertain behavior of lymphoid, hematopoietic and related tissue: Secondary | ICD-10-CM | POA: Diagnosis not present

## 2020-05-17 LAB — BASIC METABOLIC PANEL
Anion gap: 10 (ref 5–15)
BUN: 19 mg/dL (ref 8–23)
CO2: 22 mmol/L (ref 22–32)
Calcium: 9 mg/dL (ref 8.9–10.3)
Chloride: 112 mmol/L — ABNORMAL HIGH (ref 98–111)
Creatinine, Ser: 0.88 mg/dL (ref 0.44–1.00)
GFR, Estimated: >60 mL/min (ref >60)
Glucose, Bld: 98 mg/dL (ref 70–99)
Potassium: 3.3 mmol/L — ABNORMAL LOW (ref 3.5–5.1)
Sodium: 144 mmol/L (ref 135–145)

## 2020-05-17 LAB — UPEP/UIFE/LIGHT CHAINS/TP, 24-HR UR
% BETA, Urine: 5.5 %
ALPHA 1 URINE: 0.7 %
Albumin, U: 11 %
Alpha 2, Urine: 2.7 %
Free Kappa Lt Chains,Ur: 16.06 mg/L (ref 1.17–86.46)
Free Kappa/Lambda Ratio: 0.01 — ABNORMAL LOW (ref 1.83–14.26)
Free Lambda Lt Chains,Ur: 2545.19 mg/L — ABNORMAL HIGH (ref 0.27–15.21)
GAMMA GLOBULIN URINE: 80.1 %
M-SPIKE %, Urine: 75.6 % — ABNORMAL HIGH
M-Spike, Mg/24 Hr: 244 mg/24 hr — ABNORMAL HIGH
Total Protein, Urine-Ur/day: 323 mg/24 hr — ABNORMAL HIGH (ref 30–150)
Total Protein, Urine: 64.5 mg/dL
Total Volume: 500

## 2020-05-17 LAB — CBC WITH DIFFERENTIAL/PLATELET
Abs Immature Granulocytes: 0.02 10*3/uL (ref 0.00–0.07)
Basophils Absolute: 0 10*3/uL (ref 0.0–0.1)
Basophils Relative: 1 %
Eosinophils Absolute: 0 10*3/uL (ref 0.0–0.5)
Eosinophils Relative: 1 %
HCT: 25.2 % — ABNORMAL LOW (ref 36.0–46.0)
Hemoglobin: 8.2 g/dL — ABNORMAL LOW (ref 12.0–15.0)
Immature Granulocytes: 1 %
Lymphocytes Relative: 16 %
Lymphs Abs: 0.5 10*3/uL — ABNORMAL LOW (ref 0.7–4.0)
MCH: 29.6 pg (ref 26.0–34.0)
MCHC: 32.5 g/dL (ref 30.0–36.0)
MCV: 91 fL (ref 80.0–100.0)
Monocytes Absolute: 0.4 10*3/uL (ref 0.1–1.0)
Monocytes Relative: 13 %
Neutro Abs: 2.3 10*3/uL (ref 1.7–7.7)
Neutrophils Relative %: 68 %
Platelets: 81 10*3/uL — ABNORMAL LOW (ref 150–400)
RBC: 2.77 MIL/uL — ABNORMAL LOW (ref 3.87–5.11)
RDW: 16.8 % — ABNORMAL HIGH (ref 11.5–15.5)
WBC: 3.3 10*3/uL — ABNORMAL LOW (ref 4.0–10.5)
nRBC: 0 % (ref 0.0–0.2)

## 2020-05-17 MED ORDER — SODIUM CHLORIDE 0.9 % IV SOLN: INTRAVENOUS | Status: DC

## 2020-05-17 MED ORDER — MIDAZOLAM HCL 2 MG/2ML IJ SOLN
INTRAMUSCULAR | Status: AC | PRN
  Administered 2020-05-17 (×2): 1 mg via INTRAVENOUS

## 2020-05-17 MED ORDER — MIDAZOLAM HCL 2 MG/2ML IJ SOLN
INTRAMUSCULAR | Status: AC
  Filled 2020-05-17: qty 2

## 2020-05-17 MED ORDER — FLUMAZENIL 0.5 MG/5ML IV SOLN
INTRAVENOUS | Status: AC
  Filled 2020-05-17: qty 5

## 2020-05-17 MED ORDER — HEPARIN SOD (PORK) LOCK FLUSH 100 UNIT/ML IV SOLN
500.0000 [IU] | INTRAVENOUS | Status: AC | PRN
  Administered 2020-05-17: 500 [IU]
  Filled 2020-05-17: qty 5

## 2020-05-17 MED ORDER — FENTANYL CITRATE (PF) 100 MCG/2ML IJ SOLN
INTRAMUSCULAR | Status: AC
  Filled 2020-05-17: qty 2

## 2020-05-17 MED ORDER — NALOXONE HCL 0.4 MG/ML IJ SOLN
INTRAMUSCULAR | Status: AC
  Filled 2020-05-17: qty 1

## 2020-05-17 MED ORDER — LIDOCAINE HCL (PF) 1 % IJ SOLN
INTRAMUSCULAR | Status: AC | PRN
  Administered 2020-05-17: 10 mL via INTRADERMAL

## 2020-05-17 MED ORDER — FENTANYL CITRATE (PF) 100 MCG/2ML IJ SOLN
INTRAMUSCULAR | Status: AC | PRN
  Administered 2020-05-17 (×2): 50 ug via INTRAVENOUS

## 2020-05-17 NOTE — Discharge Instructions (Signed)
Please call Interventional Radiology clinic [phone] with any questions or concerns.  You may remove your dressing and shower tomorrow.   Bone Marrow Aspiration and Bone Marrow Biopsy, Adult, Care After This sheet gives you information about how to care for yourself after your procedure. Your health care provider may also give you more specific instructions. If you have problems or questions, contact your health care provider. What can I expect after the procedure? After the procedure, it is common to have:  Mild pain and tenderness.  Swelling.  Bruising. Follow these instructions at home: Puncture site care 1. Follow instructions from your health care provider about how to take care of the puncture site. Make sure you: ? Wash your hands with soap and water before and after you change your bandage (dressing). If soap and water are not available, use hand sanitizer. ? Change your dressing as told by your health care provider. 2. Check your puncture site every day for signs of infection. Check for: ? More redness, swelling, or pain. ? Fluid or blood. ? Warmth. ? Pus or a bad smell.   Activity 1. Return to your normal activities as told by your health care provider. Ask your health care provider what activities are safe for you. 2. Do not lift anything that is heavier than 10 lb (4.5 kg), or the limit that you are told, until your health care provider says that it is safe. 3. Do not drive for 24 hours if you were given a sedative during your procedure. General instructions 1. Take over-the-counter and prescription medicines only as told by your health care provider. 2. Do not take baths, swim, or use a hot tub until your health care provider approves. Ask your health care provider if you may take showers. You may only be allowed to take sponge baths. 3. If directed, put ice on the affected area. To do this: ? Put ice in a plastic bag. ? Place a towel between your skin and the  bag. ? Leave the ice on for 20 minutes, 2-3 times a day. 4. Keep all follow-up visits as told by your health care provider. This is important.   Contact a health care provider if:  Your pain is not controlled with medicine.  You have a fever.  You have more redness, swelling, or pain around the puncture site.  You have fluid or blood coming from the puncture site.  Your puncture site feels warm to the touch.  You have pus or a bad smell coming from the puncture site. Summary  After the procedure, it is common to have mild pain, tenderness, swelling, and bruising.  Follow instructions from your health care provider about how to take care of the puncture site and what activities are safe for you.  Take over-the-counter and prescription medicines only as told by your health care provider.  Contact a health care provider if you have any signs of infection, such as fluid or blood coming from the puncture site. This information is not intended to replace advice given to you by your health care provider. Make sure you discuss any questions you have with your health care provider. Document Revised: 06/04/2018 Document Reviewed: 06/04/2018 Elsevier Patient Education  2021 [place].   Moderate Conscious Sedation, Adult, Care After This sheet gives you information about how to care for yourself after your procedure. Your health care provider may also give you more specific instructions. If you have problems or questions, contact your health care provider. What  can I expect after the procedure? After the procedure, it is common to have:  Sleepiness for several hours.  Impaired judgment for several hours.  Difficulty with balance.  Vomiting if you eat too soon. Follow these instructions at home: For the time period you were told by your health care provider: 3. Rest. 4. Do not participate in activities where you could fall or become injured. 5. Do not drive or use  machinery. 6. Do not drink alcohol. 7. Do not take sleeping pills or medicines that cause drowsiness. 8. Do not make important decisions or sign legal documents. 9. Do not take care of children on your own.      Eating and drinking 4. Follow the diet recommended by your health care provider. 5. Drink enough fluid to keep your urine pale yellow. 6. If you vomit: ? Drink water, juice, or soup when you can drink without vomiting. ? Make sure you have little or no nausea before eating solid foods.   General instructions 5. Take over-the-counter and prescription medicines only as told by your health care provider. 6. Have a responsible adult stay with you for the time you are told. It is important to have someone help care for you until you are awake and alert. 7. Do not smoke. 8. Keep all follow-up visits as told by your health care provider. This is important. Contact a health care provider if:  You are still sleepy or having trouble with balance after 24 hours.  You feel light-headed.  You keep feeling nauseous or you keep vomiting.  You develop a rash.  You have a fever.  You have redness or swelling around the IV site. Get help right away if:  You have trouble breathing.  You have new-onset confusion at home. Summary  After the procedure, it is common to feel sleepy, have impaired judgment, or feel nauseous if you eat too soon.  Rest after you get home. Know the things you should not do after the procedure.  Follow the diet recommended by your health care provider and drink enough fluid to keep your urine pale yellow.  Get help right away if you have trouble breathing or new-onset confusion at home. This information is not intended to replace advice given to you by your health care provider. Make sure you discuss any questions you have with your health care provider. Document Revised: 05/16/2019 Document Reviewed: 12/12/2018 Elsevier Patient Education  2021 Elsevier  Inc.      

## 2020-05-17 NOTE — Procedures (Signed)
  Procedure: Ct BONE MARROW  BIOPSY    EBL:   minimal Complications:  none immediate  See full dictation in [employer].  Dillard Cannon MD Main # [phone] Pager  [phone] Mobile [phone]

## 2020-05-17 NOTE — H&P (Signed)
Chief Complaint: Patient was seen in consultation today for image guided bone marrow biopsy and aspiration at the request of Ennever,Peter R  Referring Physician(s): Ennever,Peter R  Supervising Physician: Oley Balm  Patient Status: [facility] - Out-pt  History of Present Illness: Emily Greer is a 70 y.o. female with past medical history significant of HTN, GERD, osteoporosis, endometrial carcinoma s/p chemoradiation therapy, lambda light chain myeloma originally diagnosed in 1999 s/p chemotherapy and stem cell transplant in 2000 and 2016 who had a visit with Dr. Myna Hidalgo due to increasing lambda light chain concerning for progression of the myeloma.  She is known to our service for previous bone marrow biopsy and aspiration and port-a-catheter insertion in 2018, and thyroid fine-needle aspiration in 2021.   Patient has been followed by oncology for history of endometrial cancer and lambda light chain myeloma.  The patient underwent routine lab work which showed persistent increase of the lambda light chain.  After thorough discussion, a bone marrow biopsy and aspiration was recommended to the patient for further evaluation of increasing lambda light chain.  IR was requested for image guided bone marrow biopsy and aspiration.  Patient laying in bed, not in acute distress.  Denise headache, fever, chills, shortness of breath, cough, chest pain, abdominal pain, nausea ,vomiting, and bleeding.   Past Medical History:  Diagnosis Date  . Avascular necrosis of femoral head (HCC)    bilateral per CT 07-26-2015  . Endometrial carcinoma [facility]) gyn oncologist-  dr Nelly Rout ([facility])/  radiation oncologist-- dr Roselind Messier   2013 dx  FIGO Stage 1A, Grade 2 endometrioid endometrial cancer s/p TAH w/ BSO and bilateral pelvic node dissection 10-31-2011 ;  recurrence at distal vagina 04/ 2014 s/p  brachytherapy (ended 07-29-2012);  2nd recurrence 12/ 2016  vaginal apex s/p  conformational  radiotherapy 03-10-2015 to 04-20-2015  . Family history of adverse reaction to anesthesia    mother ponv  . GERD (gastroesophageal reflux disease)   . Goals of care, counseling/discussion 08/21/2019  . H/O stem cell transplant (HCC)    02/ 2000 and second one 06/ 2016  . 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 History of chemotherapy    last chemo 12-26-17  . History of radiation therapy 6/4, 6/11, 6/19, 6/25, 07/29/2012   vagina 30.5 gray in 5 fx, HDR brachytherapy:   last radiation to vagina 03-10-2015 to 04-20-2015  50.4gray  . History of radiation therapy 03/10/15-04/20/15   vagina 50.4 in 28 fractions  . Hypertension   . Lambda light chain myeloma [facility]) oncologist-  dr Myna Hidalgo ([facility])  and  [facility] -- dr Silvestre Mesi gasparetto   dx 07/ 1999 s/p  VAD chemotherapy 11/ 1999,  purged autotransplant 03-21-1998 followed by auto stem cell transplant 03-29-1998;  recurrance w/ second autologous stem cell transplant 07-24-2014;  in Re-mission currently , chemo maintenance therapy  . Osteoporosis 12/18/05   Increased  risk   . PONV (postoperative nausea and vomiting)   . Wears glasses     Past Surgical History:  Procedure Laterality Date  . ABDOMINAL HYSTERECTOMY  10/31/2011   Procedure: HYSTERECTOMY ABDOMINAL;  Surgeon: Laurette Schimke, MD PHD;  Location: [facility];  Service: Gynecology;  Laterality: N/A;  . CO2 LASER APPLICATION N/A 02/22/2016   Procedure: CO2 LASER APPLICATION;  Surgeon: Laurette Schimke, MD;  Location: [facility];  Service: Gynecology;  Laterality: N/A;  . CYSTOSCOPY WITH RETROGRADE PYELOGRAM, URETEROSCOPY AND STENT PLACEMENT Left  02/04/2018   Procedure: CYSTOSCOPY WITH RETROGRADE PYELOGRAM, URETEROSCOPY , bladder biopsy and fulgeration;  Surgeon: Cleon Gustin, MD;  Location: [facility];  Service: Urology;  Laterality: Left;  . [place]  . HYSTEROSCOPY WITH D & C   09/27/2011   Procedure: DILATATION AND CURETTAGE /HYSTEROSCOPY;  Surgeon: Eldred Manges, MD;  Location: [facility];  Service: Gynecology;;  . IR FLUORO GUIDE PORT INSERTION RIGHT  09/08/2016  . IR US GUIDE VASC ACCESS RIGHT  09/08/2016  . LAPAROTOMY  10/31/2011   Procedure: EXPLORATORY LAPAROTOMY;  Surgeon: [name], MD PHD;  Location: [facility];  Service: Gynecology;  Laterality: N/A;  . SALPINGOOPHORECTOMY  10/31/2011   Procedure: SALPINGO OOPHERECTOMY;  Surgeon: [name], MD PHD;  Location: [facility];  Service: Gynecology;  Laterality: Bilateral;   Lymph Nodes sampling  . TUBAL LIGATION  1986    Allergies: Codeine  Medications: Prior to Admission medications   Medication Sig Start Date End Date Taking? Authorizing Provider  acyclovir (ZOVIRAX) 400 MG tablet TAKE 1 TABLET(400 MG) BY MOUTH TWICE DAILY 04/07/20   [name], MD  albuterol (VENTOLIN HFA) 108 (90 Base) MCG/ACT inhaler Inhale 2 puffs into the lungs every 6 (six) hours as needed for wheezing or shortness of breath. 2 puffs 3 times daily x 5 days then every 6 hours as needed. 03/31/19   Glendale Chard, MD  amLODipine (NORVASC) 10 MG tablet TAKE 1 TABLET(10 MG) BY MOUTH EVERY MORNING 04/05/20   Glendale Chard, MD  aspirin EC 81 MG tablet Take 81 mg by mouth daily.    [provider]  belantamab mafodotin-blmf 2.5 mg/kg in sodium chloride 0.9 % 250 mL Inject 2.5 mg/kg into the vein once a week. Blenrep 2.5 mg/m2 IV q 3 week -- start cycle #1 on 05/20/2020 05/20/20   [provider]  Calcium-Magnesium-Vitamin D (CALCIUM 1200+D3 PO) Take 1 tablet by mouth daily.    [provider]  cetirizine (ZYRTEC ALLERGY) 10 MG tablet Take 1 tablet (10 mg total) by mouth at bedtime. Patient taking differently: Take 10 mg by mouth daily. 01/05/20 01/04/21  Glendale Chard, MD  Cholecalciferol (VITAMIN D3) 2000 UNITS TABS Take 2 tablets by mouth daily.     [provider]  hydrochlorothiazide (MICROZIDE) 12.5 MG  capsule Take 12.5 mg by mouth every morning.    [provider]  letrozole (FEMARA) 2.5 MG tablet Take 1 tablet (2.5 mg total) by mouth daily. 05/20/15   [name], MD  lidocaine-prilocaine (EMLA) cream Apply 1 application topically as needed. 10/10/19   [name], MD  loperamide (IMODIUM) 2 MG capsule Take by mouth as needed for diarrhea or loose stools. Reported on 08/19/2015    [provider]  loratadine (CLARITIN) 10 MG tablet Take 10 mg by mouth every morning. Reported on 08/19/2015    [provider]  metoprolol succinate (TOPROL-XL) 50 MG 24 hr tablet Take 1 tablet (50 mg total) by mouth every morning. 06/26/19   [name], MD  montelukast (SINGULAIR) 10 MG tablet TAKE 1 TABLET(10 MG) BY MOUTH AT BEDTIME 04/05/20   Glendale Chard, MD  ondansetron (ZOFRAN) 8 MG tablet Take 1 tablet (8 mg total) by mouth 2 (two) times daily as needed (Nausea or vomiting). Patient not taking: Reported on 04/27/2020 08/28/19   [name], MD  potassium chloride SA (KLOR-CON) 20 MEQ tablet TAKE 1 TABLET(20 MEQ) BY MOUTH TWICE DAILY 06/30/19   [name], MD  Probiotic Product (PROBIOTIC DAILY PO) Take by mouth daily.    [provider]  triamcinolone (NASACORT) 55 MCG/ACT AERO nasal inhaler Place 2 sprays into the nose as needed.     [provider]  triamterene-hydrochlorothiazide Bradd Burner) 37.5-25 MG tablet 1 tablet by mouth at bedtime 03/31/19   Glendale Chard, MD     Family History  Problem Relation Age of Onset  . Colon cancer Mother   . Hypertension Father   . Heart Problems Father     Social History   Socioeconomic History  . Marital status: Married    Spouse name: Not on file  . Number of children: Not on file  . Years of education: Not on file  . Highest education level: Not on file  Occupational History  . Occupation: retired  Tobacco Use  . Smoking status: Never Smoker  . Smokeless tobacco: Never Used  Vaping Use  .  Vaping Use: Never used  Substance and Sexual Activity  . Alcohol use: Not Currently    Alcohol/week: 0.0 standard drinks    Comment:  occasional wine  . Drug use: Never  . Sexual activity: Yes  Other Topics Concern  . Not on file  Social History Narrative  . Not on file   Social Determinants of Health   Financial Resource Strain: Low Risk   . Difficulty of Paying Living Expenses: Not hard at all  Food Insecurity: No Food Insecurity  . Worried About Charity fundraiser in the Last Year: Never true  . Ran Out of Food in the Last Year: Never true  Transportation Needs: No Transportation Needs  . Lack of Transportation (Medical): No  . Lack of Transportation (Non-Medical): No  Physical Activity: Insufficiently Active  . Days of Exercise per Week: 2 days  . Minutes of Exercise per Session: 20 min  Stress: No Stress Concern Present  . Feeling of Stress : Not at all  Social Connections: Not on file     Review of Systems: A 12 point ROS discussed and pertinent positives are indicated in the HPI above.  All other systems are negative.   Vital Signs: BP (!) 163/97   Pulse 91   Temp 98.9 F (37.2 C) (Oral)   Resp 16   Wt 174 lb (78.9 kg)   SpO2 100%   BMI 31.83 kg/m   Physical Exam: Vitals and nursing note reviewed.  Constitutional:      General: He is not in acute distress.    Appearance: Normal appearance.  HENT:     Head: Normocephalic and atraumatic.     Mouth/Throat:     Mouth: Mucous membranes are moist.     Pharynx: Oropharynx is clear.  Cardiovascular:     Rate and Rhythm: Normal rate and regular rhythm.     Pulses: Normal pulses.     Heart sounds: Normal heart sounds.  Pulmonary:     Effort: Pulmonary effort is normal.     Breath sounds: Normal breath sounds. No wheezing, rhonchi or rales.  Abdominal:     General: Bowel sounds are normal. There is no distension.     Palpations: Abdomen is soft.  Skin:    General: Skin is warm and dry.  Neurological:      Mental Status: He is alert and oriented to person, place, and time.  Psychiatric:        Mood and Affect: Mood normal.        Behavior: Behavior normal.    MD  Evaluation Airway: WNL Heart: WNL Abdomen: WNL Chest/ Lungs: WNL ASA  Classification: 3 Mallampati/Airway Score: Two  Imaging: No results found.  Labs:  CBC: Recent Labs    02/19/20 1450 03/18/20 1308 04/14/20 1000 04/27/20 1145  WBC 2.2* 2.2* 2.7* 2.8*  HGB 10.0* 9.5* 8.7* 9.4*  HCT 30.0* 28.2* 25.9* 27.9*  PLT 52* 41* 59* 58*    COAGS: No results for input(s): INR, APTT in the last 8760 hours.  BMP: Recent Labs    08/21/19 1111 08/28/19 0900 10/01/19 1000 10/08/19 1117 11/05/19 0859 02/19/20 1450 03/18/20 1308 04/14/20 1000 04/27/20 1145  NA 142 142 141 142   < > 136 137 137 136  K 3.6 3.4* 3.9 3.5   < > 3.4* 3.6 3.7 3.0*  CL 109 108 107 106   < > 101 104 105 104  CO2 $Re'26 26 27 27   'gFG$ < > $R'26 25 25 'yw$ 21*  GLUCOSE 100* 105* 94 97   < > 91 98 107* 105*  BUN $Re'16 13 19 15   'XdM$ < > $R'16 17 19 21  'oG$ CALCIUM 9.4 9.5 10.1 9.6   < > 9.9 9.7 9.3 9.0  CREATININE 0.82 0.84 0.86 0.89   < > 0.87 0.94 0.81 0.87  GFRNONAA >60 >60 >60 >60   < > >60 >60 >60 >60  GFRAA >60 >60 >60 >60  --   --   --   --   --    < > = values in this interval not displayed.    LIVER FUNCTION TESTS: Recent Labs    02/19/20 1450 03/18/20 1308 04/14/20 1000 04/27/20 1145  BILITOT 0.5 0.3 0.3 0.3  AST 11* 11* 12* 21  ALT $Re'14 18 13 21  'EuS$ ALKPHOS 26* 26* 30* 35*  PROT 6.6 6.3* 6.0* 7.0  ALBUMIN 4.7 4.5 4.5 4.3    TUMOR MARKERS: No results for input(s): AFPTM, CEA, CA199, CHROMGRNA in the last 8760 hours.  Assessment and Plan: 70 y.o. female with history of lambda light chain myeloma with increasing lambda light chain seen on recent lab.   After thorough discussion and shared decision making with her oncology team, patient decided to undergo a bone marrow biopsy and aspiration. IR was requested for image guided bone marrow biopsy and  aspiration. Patient presents to IR today for the procedure. N.p.o. midnight since midnight Not on anticoagulation Afebrile  Risks and benefits of bone marrow biopsy and aspiration was discussed with the patient and/or patient's family including, but not limited to bleeding, infection, damage to adjacent structures or low yield requiring additional tests.  All of the questions were answered and there is agreement to proceed.  Consent signed and in chart.    Thank you for this interesting consult.  I greatly enjoyed meeting ROSELY FERNANDEZ and look forward to participating in their care.  A copy of this report was sent to the requesting provider on this date.  Electronically Signed: Tera Mater, PA-C 05/17/2020, 8:15 AM   I spent a total of    25 Minutes in face to face in clinical consultation, greater than 50% of which was counseling/coordinating care for bone marrow biopsy and aspiration

## 2020-05-19 ENCOUNTER — Other Ambulatory Visit: Payer: Self-pay

## 2020-05-19 ENCOUNTER — Other Ambulatory Visit: Payer: Self-pay | Admitting: Family

## 2020-05-19 DIAGNOSIS — C9000 Multiple myeloma not having achieved remission: Secondary | ICD-10-CM

## 2020-05-19 LAB — SURGICAL PATHOLOGY

## 2020-05-20 ENCOUNTER — Inpatient Hospital Stay (HOSPITAL_BASED_OUTPATIENT_CLINIC_OR_DEPARTMENT_OTHER): Payer: Medicare Other | Admitting: Hematology & Oncology

## 2020-05-20 ENCOUNTER — Encounter: Payer: Self-pay | Admitting: Hematology & Oncology

## 2020-05-20 ENCOUNTER — Inpatient Hospital Stay: Payer: Medicare Other

## 2020-05-20 ENCOUNTER — Other Ambulatory Visit: Payer: Self-pay

## 2020-05-20 ENCOUNTER — Ambulatory Visit: Payer: Medicare Other

## 2020-05-20 VITALS — BP 142/73 | HR 92 | Temp 99.3°F | Resp 18 | Ht 62.0 in | Wt 177.0 lb

## 2020-05-20 DIAGNOSIS — C9000 Multiple myeloma not having achieved remission: Secondary | ICD-10-CM | POA: Diagnosis not present

## 2020-05-20 DIAGNOSIS — Z79899 Other long term (current) drug therapy: Secondary | ICD-10-CM | POA: Diagnosis not present

## 2020-05-20 DIAGNOSIS — M7989 Other specified soft tissue disorders: Secondary | ICD-10-CM | POA: Diagnosis not present

## 2020-05-20 DIAGNOSIS — D509 Iron deficiency anemia, unspecified: Secondary | ICD-10-CM | POA: Diagnosis not present

## 2020-05-20 DIAGNOSIS — Z5112 Encounter for antineoplastic immunotherapy: Secondary | ICD-10-CM | POA: Diagnosis not present

## 2020-05-20 DIAGNOSIS — C9001 Multiple myeloma in remission: Secondary | ICD-10-CM

## 2020-05-20 DIAGNOSIS — C541 Malignant neoplasm of endometrium: Secondary | ICD-10-CM

## 2020-05-20 DIAGNOSIS — D61818 Other pancytopenia: Secondary | ICD-10-CM | POA: Diagnosis not present

## 2020-05-20 LAB — CBC WITH DIFFERENTIAL (CANCER CENTER ONLY)
Abs Immature Granulocytes: 0.04 10*3/uL (ref 0.00–0.07)
Basophils Absolute: 0 10*3/uL (ref 0.0–0.1)
Basophils Relative: 1 %
Eosinophils Absolute: 0 10*3/uL (ref 0.0–0.5)
Eosinophils Relative: 1 %
HCT: 25.4 % — ABNORMAL LOW (ref 36.0–46.0)
Hemoglobin: 8.3 g/dL — ABNORMAL LOW (ref 12.0–15.0)
Immature Granulocytes: 1 %
Lymphocytes Relative: 14 %
Lymphs Abs: 0.5 10*3/uL — ABNORMAL LOW (ref 0.7–4.0)
MCH: 29.2 pg (ref 26.0–34.0)
MCHC: 32.7 g/dL (ref 30.0–36.0)
MCV: 89.4 fL (ref 80.0–100.0)
Monocytes Absolute: 0.4 10*3/uL (ref 0.1–1.0)
Monocytes Relative: 11 %
Neutro Abs: 2.6 10*3/uL (ref 1.7–7.7)
Neutrophils Relative %: 72 %
Platelet Count: 83 10*3/uL — ABNORMAL LOW (ref 150–400)
RBC: 2.84 MIL/uL — ABNORMAL LOW (ref 3.87–5.11)
RDW: 16.5 % — ABNORMAL HIGH (ref 11.5–15.5)
WBC Count: 3.6 10*3/uL — ABNORMAL LOW (ref 4.0–10.5)
nRBC: 0 % (ref 0.0–0.2)

## 2020-05-20 LAB — CMP (CANCER CENTER ONLY)
ALT: 12 U/L (ref 0–44)
AST: 12 U/L — ABNORMAL LOW (ref 15–41)
Albumin: 4.3 g/dL (ref 3.5–5.0)
Alkaline Phosphatase: 41 U/L (ref 38–126)
Anion gap: 9 (ref 5–15)
BUN: 21 mg/dL (ref 8–23)
CO2: 27 mmol/L (ref 22–32)
Calcium: 10.1 mg/dL (ref 8.9–10.3)
Chloride: 105 mmol/L (ref 98–111)
Creatinine: 0.84 mg/dL (ref 0.44–1.00)
GFR, Estimated: >60 mL/min (ref >60)
Glucose, Bld: 100 mg/dL — ABNORMAL HIGH (ref 70–99)
Potassium: 3.5 mmol/L (ref 3.5–5.1)
Sodium: 141 mmol/L (ref 135–145)
Total Bilirubin: 0.3 mg/dL (ref 0.3–1.2)
Total Protein: 6.6 g/dL (ref 6.5–8.1)

## 2020-05-20 MED ORDER — DENOSUMAB 120 MG/1.7ML ~~LOC~~ SOLN
SUBCUTANEOUS | Status: AC
  Filled 2020-05-20: qty 1.7

## 2020-05-20 MED ORDER — CARBOXYMETHYLCELL-GLYCERIN PF 0.5-0.9 % OP SOLN: 2.0000 [drp] | Freq: Four times a day (QID) | OPHTHALMIC | 11 refills | Status: DC

## 2020-05-20 MED ORDER — HEPARIN SOD (PORK) LOCK FLUSH 100 UNIT/ML IV SOLN
500.0000 [IU] | Freq: Once | INTRAVENOUS | Status: AC | PRN
  Administered 2020-05-20: 500 [IU]
  Filled 2020-05-20: qty 5

## 2020-05-20 MED ORDER — SODIUM CHLORIDE 0.9 % IV SOLN
2.5000 mg/kg | Freq: Once | INTRAVENOUS | Status: AC
  Administered 2020-05-20: 200 mg via INTRAVENOUS
  Filled 2020-05-20: qty 4

## 2020-05-20 MED ORDER — ACETAMINOPHEN 325 MG PO TABS
ORAL_TABLET | ORAL | Status: AC
  Filled 2020-05-20: qty 2

## 2020-05-20 MED ORDER — DENOSUMAB 120 MG/1.7ML ~~LOC~~ SOLN
120.0000 mg | Freq: Once | SUBCUTANEOUS | Status: AC
  Administered 2020-05-20: 120 mg via SUBCUTANEOUS

## 2020-05-20 MED ORDER — PROCHLORPERAZINE MALEATE 10 MG PO TABS
ORAL_TABLET | ORAL | Status: AC
  Filled 2020-05-20: qty 1

## 2020-05-20 MED ORDER — DIPHENHYDRAMINE HCL 25 MG PO CAPS
50.0000 mg | ORAL_CAPSULE | Freq: Once | ORAL | Status: AC
Start: 2020-05-20 — End: 2020-05-20
  Administered 2020-05-20: 50 mg via ORAL

## 2020-05-20 MED ORDER — SODIUM CHLORIDE 0.9% FLUSH
10.0000 mL | INTRAVENOUS | Status: DC | PRN
  Administered 2020-05-20: 10 mL
  Filled 2020-05-20: qty 10

## 2020-05-20 MED ORDER — DIPHENHYDRAMINE HCL 25 MG PO CAPS
ORAL_CAPSULE | ORAL | Status: AC
  Filled 2020-05-20: qty 2

## 2020-05-20 MED ORDER — SODIUM CHLORIDE 0.9 % IV SOLN
Freq: Once | INTRAVENOUS | Status: AC
  Filled 2020-05-20: qty 250

## 2020-05-20 MED ORDER — ACETAMINOPHEN 325 MG PO TABS
650.0000 mg | ORAL_TABLET | Freq: Once | ORAL | Status: AC
  Administered 2020-05-20: 650 mg via ORAL

## 2020-05-20 MED ORDER — PROCHLORPERAZINE MALEATE 10 MG PO TABS
10.0000 mg | ORAL_TABLET | Freq: Once | ORAL | Status: AC
  Administered 2020-05-20: 10 mg via ORAL

## 2020-05-20 NOTE — Patient Instructions (Signed)
Implanted Port Insertion, Care After This sheet gives you information about how to care for yourself after your procedure. Your health care provider may also give you more specific instructions. If you have problems or questions, contact your health care provider. What can I expect after the procedure? After the procedure, it is common to have:  Discomfort at the port insertion site.  Bruising on the skin over the port. This should improve over 3-4 days. Follow these instructions at home: Port care  After your port is placed, you will get a manufacturer's information card. The card has information about your port. Keep this card with you at all times.  Take care of the port as told by your health care provider. Ask your health care provider if you or a family member can get training for taking care of the port at home. A home health care nurse may also take care of the port.  Make sure to remember what type of port you have. Incision care  Follow instructions from your health care provider about how to take care of your port insertion site. Make sure you: ? Wash your hands with soap and water before and after you change your bandage (dressing). If soap and water are not available, use hand sanitizer. ? Change your dressing as told by your health care provider. ? Leave stitches (sutures), skin glue, or adhesive strips in place. These skin closures may need to stay in place for 2 weeks or longer. If adhesive strip edges start to loosen and curl up, you may trim the loose edges. Do not remove adhesive strips completely unless your health care provider tells you to do that.  Check your port insertion site every day for signs of infection. Check for: ? Redness, swelling, or pain. ? Fluid or blood. ? Warmth. ? Pus or a bad smell.      Activity  Return to your normal activities as told by your health care provider. Ask your health care provider what activities are safe for you.  Do not  lift anything that is heavier than 10 lb (4.5 kg), or the limit that you are told, until your health care provider says that it is safe. General instructions  Take over-the-counter and prescription medicines only as told by your health care provider.  Do not take baths, swim, or use a hot tub until your health care provider approves. Ask your health care provider if you may take showers. You may only be allowed to take sponge baths.  Do not drive for 24 hours if you were given a sedative during your procedure.  Wear a medical alert bracelet in case of an emergency. This will tell any health care providers that you have a port.  Keep all follow-up visits as told by your health care provider. This is important. Contact a health care provider if:  You cannot flush your port with saline as directed, or you cannot draw blood from the port.  You have a fever or chills.  You have redness, swelling, or pain around your port insertion site.  You have fluid or blood coming from your port insertion site.  Your port insertion site feels warm to the touch.  You have pus or a bad smell coming from the port insertion site. Get help right away if:  You have chest pain or shortness of breath.  You have bleeding from your port that you cannot control. Summary  Take care of the port as told by your   health care provider. Keep the manufacturer's information card with you at all times.  Change your dressing as told by your health care provider.  Contact a health care provider if you have a fever or chills or if you have redness, swelling, or pain around your port insertion site.  Keep all follow-up visits as told by your health care provider. This information is not intended to replace advice given to you by your health care provider. Make sure you discuss any questions you have with your health care provider. Document Revised: 08/14/2017 Document Reviewed: 08/14/2017 Elsevier Patient Education   2021 [employer] Inc.  

## 2020-05-20 NOTE — Patient Instructions (Addendum)
2 RNs Verified : Drug appearance and integrity are intact; Drug expiration date and time- drug has not expired; Pump settings are accurate, if applicable   Belantamab Mafodotin for Injection What is this medicine? BELANTAMAB MAFODOTIN (bel an ta mab ma foe doe tin) is a monoclonal antibody and a chemotherapy drug. It is used to treat multiple myeloma. This medicine may be used for other purposes; ask your health care provider or pharmacist if you have questions. COMMON BRAND NAME(S): BLENREP What should I tell my health care provider before I take this medicine? They need to know if you have any of these conditions:  bleeding disorders  eye disease, vision problems  an unusual or allergic reaction to belantamab mafodotin, other medicines, foods, dyes, or preservatives  pregnant or trying to get pregnant  breast-feeding How should I use this medicine? This medicine is for infusion into a vein. It is given by a health care professional in a hospital or clinic setting. A special MedGuide will be given to you before each treatment. Be sure to read this information carefully each time. Talk to your pediatrician regarding the use of this medicine in children. Special care may be needed. Overdosage: If you think you have taken too much of this medicine contact a poison control center or emergency room at once. NOTE: This medicine is only for you. Do not share this medicine with others. What if I miss a dose? It is important not to miss your dose. Call your doctor or health care professional if you are unable to keep an appointment. What may interact with this medicine? Interactions have not been studied. Give your health care provider a list of all the medicines, herbs, non-prescription drugs, or dietary supplements you use. Also tell them if you smoke, drink alcohol, or use illegal drugs. Some items may interact with your medicine. This list may not describe all possible interactions. Give  your health care provider a list of all the medicines, herbs, non-prescription drugs, or dietary supplements you use. Also tell them if you smoke, drink alcohol, or use illegal drugs. Some items may interact with your medicine. What should I watch for while using this medicine? Your condition will be monitored carefully while you are receiving this medicine. If you wear contact lenses, ask your health care professional when you can wear your lenses again. Tell your health care professional right away if you have any change in your eyesight. Your vision may be tested before and during use of this medicine. This medicine may increase your risk to bruise or bleed. Call your health care professional if you notice any unusual bleeding. Do not become pregnant while taking this medicine or for 4 months after stopping it. Women should inform their doctor if they wish to become pregnant or think they might be pregnant. Men should not father a child while taking this medicine or for 6 months after stopping it. There is a potential for serious side effects to an unborn child. Talk to your health care professional or pharmacist for more information. Do not breast-feed an infant while taking this medicine or for 3 months after stopping it. This medicine may make it more difficult to get pregnant or father a child. Talk to your health care professional if you are concerned about your fertility. What side effects may I notice from receiving this medicine? Side effects that you should report to your doctor or health care professional as soon as possible:  allergic reactions like skin rash, itching  or hives, swelling of the face, lips, or tongue  blurred vision  changes in vision  chills  cough  dizziness  facial flushing  fever  palpitations  shortness of breath  signs of decreased platelets or bleeding - bruising, pinpoint red spots on the skin, black, tarry stools, blood in the  urine  tiredness Side effects that usually do not require medical attention (report these to your doctor or health care professional if they continue or are bothersome):  back pain  constipation  decreased appetite  diarrhea  joint pain  nausea This list may not describe all possible side effects. Call your doctor for medical advice about side effects. You may report side effects to FDA at 1-800-FDA-1088. Where should I keep my medicine? This drug is given in a hospital or clinic and will not be stored at home. NOTE: This sheet is a summary. It may not cover all possible information. If you have questions about this medicine, talk to your doctor, pharmacist, or health care provider.  2021 Elsevier/Gold Standard (2018-09-13 09:48:35)

## 2020-05-20 NOTE — Progress Notes (Signed)
2 RNs Verified : Drug appearance and integrity are intact; Drug expiration date and time- drug has not expired; Pump settings are accurate, if applicable   [facility] Discharge Instructions for Patients Receiving Chemotherapy  Today you received the following chemotherapy agents Blenrep  To help prevent nausea and vomiting after your treatment, we encourage you to take your nausea medication    If you develop nausea and vomiting that is not controlled by your nausea medication, call the clinic.   BELOW ARE SYMPTOMS THAT SHOULD BE REPORTED IMMEDIATELY:  *FEVER GREATER THAN 100.5 F  *CHILLS WITH OR WITHOUT FEVER  NAUSEA AND VOMITING THAT IS NOT CONTROLLED WITH YOUR NAUSEA MEDICATION  *UNUSUAL SHORTNESS OF BREATH  *UNUSUAL BRUISING OR BLEEDING  TENDERNESS IN MOUTH AND THROAT WITH OR WITHOUT PRESENCE OF ULCERS  *URINARY PROBLEMS  *BOWEL PROBLEMS  UNUSUAL RASH Items with * indicate a potential emergency and should be followed up as soon as possible.  Feel free to call the clinic you have any questions or concerns. The clinic phone number is [phone].   Please show the [place] at check-in to the Emergency Department and triage nurse.

## 2020-05-21 LAB — KAPPA/LAMBDA LIGHT CHAINS
Kappa free light chain: 5.3 mg/L (ref 3.3–19.4)
Kappa, lambda light chain ratio: 0.01 — ABNORMAL LOW (ref 0.26–1.65)
Lambda free light chains: 368.9 mg/L — ABNORMAL HIGH (ref 5.7–26.3)

## 2020-05-24 ENCOUNTER — Encounter (HOSPITAL_COMMUNITY): Payer: Self-pay | Admitting: Hematology & Oncology

## 2020-05-24 ENCOUNTER — Telehealth: Payer: Self-pay

## 2020-05-24 LAB — PROTEIN ELECTROPHORESIS, SERUM
A/G Ratio: 1.5 (ref 0.7–1.7)
Albumin ELP: 3.6 g/dL (ref 2.9–4.4)
Alpha-1-Globulin: 0.2 g/dL (ref 0.0–0.4)
Alpha-2-Globulin: 0.7 g/dL (ref 0.4–1.0)
Beta Globulin: 1 g/dL (ref 0.7–1.3)
Gamma Globulin: 0.4 g/dL (ref 0.4–1.8)
Globulin, Total: 2.4 g/dL (ref 2.2–3.9)
Total Protein ELP: 6 g/dL (ref 6.0–8.5)

## 2020-05-24 NOTE — Telephone Encounter (Signed)
Called pt with 05/20/20 los appts   [employer]

## 2020-05-25 LAB — IMMUNOFIXATION ELECTROPHORESIS
IgA: 44 mg/dL — ABNORMAL LOW (ref 87–352)
IgG (Immunoglobin G), Serum: 511 mg/dL — ABNORMAL LOW (ref 586–1602)
IgM (Immunoglobulin M), Srm: <5 mg/dL — ABNORMAL LOW (ref 26–217)
Total Protein ELP: 6.2 g/dL (ref 6.0–8.5)

## 2020-05-26 ENCOUNTER — Encounter (HOSPITAL_COMMUNITY): Payer: Self-pay | Admitting: Hematology & Oncology

## 2020-05-27 DIAGNOSIS — L658 Other specified nonscarring hair loss: Secondary | ICD-10-CM | POA: Diagnosis not present

## 2020-05-27 DIAGNOSIS — L659 Nonscarring hair loss, unspecified: Secondary | ICD-10-CM | POA: Diagnosis not present

## 2020-06-01 DIAGNOSIS — H25813 Combined forms of age-related cataract, bilateral: Secondary | ICD-10-CM | POA: Diagnosis not present

## 2020-06-01 DIAGNOSIS — H04123 Dry eye syndrome of bilateral lacrimal glands: Secondary | ICD-10-CM | POA: Diagnosis not present

## 2020-06-01 DIAGNOSIS — H40033 Anatomical narrow angle, bilateral: Secondary | ICD-10-CM | POA: Diagnosis not present

## 2020-06-10 ENCOUNTER — Inpatient Hospital Stay (HOSPITAL_BASED_OUTPATIENT_CLINIC_OR_DEPARTMENT_OTHER): Payer: Medicare Other | Admitting: Hematology & Oncology

## 2020-06-10 ENCOUNTER — Inpatient Hospital Stay: Payer: Medicare Other | Attending: Hematology & Oncology

## 2020-06-10 ENCOUNTER — Inpatient Hospital Stay: Payer: Medicare Other

## 2020-06-10 ENCOUNTER — Telehealth: Payer: Self-pay

## 2020-06-10 ENCOUNTER — Other Ambulatory Visit: Payer: Self-pay

## 2020-06-10 ENCOUNTER — Encounter: Payer: Self-pay | Admitting: Hematology & Oncology

## 2020-06-10 VITALS — BP 121/61 | HR 82 | Temp 98.9°F | Resp 16 | Wt 176.0 lb

## 2020-06-10 DIAGNOSIS — C9000 Multiple myeloma not having achieved remission: Secondary | ICD-10-CM | POA: Diagnosis not present

## 2020-06-10 DIAGNOSIS — Z5112 Encounter for antineoplastic immunotherapy: Secondary | ICD-10-CM | POA: Insufficient documentation

## 2020-06-10 DIAGNOSIS — Z923 Personal history of irradiation: Secondary | ICD-10-CM | POA: Insufficient documentation

## 2020-06-10 DIAGNOSIS — Z8542 Personal history of malignant neoplasm of other parts of uterus: Secondary | ICD-10-CM | POA: Insufficient documentation

## 2020-06-10 DIAGNOSIS — D500 Iron deficiency anemia secondary to blood loss (chronic): Secondary | ICD-10-CM | POA: Diagnosis not present

## 2020-06-10 DIAGNOSIS — H538 Other visual disturbances: Secondary | ICD-10-CM | POA: Diagnosis not present

## 2020-06-10 DIAGNOSIS — Z79899 Other long term (current) drug therapy: Secondary | ICD-10-CM | POA: Insufficient documentation

## 2020-06-10 DIAGNOSIS — Z885 Allergy status to narcotic agent status: Secondary | ICD-10-CM | POA: Diagnosis not present

## 2020-06-10 LAB — CMP (CANCER CENTER ONLY)
ALT: 13 U/L (ref 0–44)
AST: 16 U/L (ref 15–41)
Albumin: 4.2 g/dL (ref 3.5–5.0)
Alkaline Phosphatase: 41 U/L (ref 38–126)
Anion gap: 7 (ref 5–15)
BUN: 16 mg/dL (ref 8–23)
CO2: 26 mmol/L (ref 22–32)
Calcium: 9.5 mg/dL (ref 8.9–10.3)
Chloride: 104 mmol/L (ref 98–111)
Creatinine: 0.78 mg/dL (ref 0.44–1.00)
GFR, Estimated: >60 mL/min (ref >60)
Glucose, Bld: 93 mg/dL (ref 70–99)
Potassium: 3.6 mmol/L (ref 3.5–5.1)
Sodium: 137 mmol/L (ref 135–145)
Total Bilirubin: 0.3 mg/dL (ref 0.3–1.2)
Total Protein: 6.3 g/dL — ABNORMAL LOW (ref 6.5–8.1)

## 2020-06-10 LAB — CBC WITH DIFFERENTIAL (CANCER CENTER ONLY)
Abs Immature Granulocytes: 0.01 10*3/uL (ref 0.00–0.07)
Basophils Absolute: 0 10*3/uL (ref 0.0–0.1)
Basophils Relative: 1 %
Eosinophils Absolute: 0.1 10*3/uL (ref 0.0–0.5)
Eosinophils Relative: 1 %
HCT: 27.3 % — ABNORMAL LOW (ref 36.0–46.0)
Hemoglobin: 8.7 g/dL — ABNORMAL LOW (ref 12.0–15.0)
Immature Granulocytes: 0 %
Lymphocytes Relative: 11 %
Lymphs Abs: 0.4 10*3/uL — ABNORMAL LOW (ref 0.7–4.0)
MCH: 28.3 pg (ref 26.0–34.0)
MCHC: 31.9 g/dL (ref 30.0–36.0)
MCV: 88.9 fL (ref 80.0–100.0)
Monocytes Absolute: 0.5 10*3/uL (ref 0.1–1.0)
Monocytes Relative: 12 %
Neutro Abs: 3 10*3/uL (ref 1.7–7.7)
Neutrophils Relative %: 75 %
Platelet Count: 100 10*3/uL — ABNORMAL LOW (ref 150–400)
RBC: 3.07 MIL/uL — ABNORMAL LOW (ref 3.87–5.11)
RDW: 16.7 % — ABNORMAL HIGH (ref 11.5–15.5)
WBC Count: 3.9 10*3/uL — ABNORMAL LOW (ref 4.0–10.5)
nRBC: 0 % (ref 0.0–0.2)

## 2020-06-10 LAB — LACTATE DEHYDROGENASE: LDH: 315 U/L — ABNORMAL HIGH (ref 98–192)

## 2020-06-10 MED ORDER — SODIUM CHLORIDE 0.9 % IV SOLN
2.5000 mg/kg | Freq: Once | INTRAVENOUS | Status: AC
  Administered 2020-06-10: 200 mg via INTRAVENOUS
  Filled 2020-06-10: qty 4

## 2020-06-10 MED ORDER — DIPHENHYDRAMINE HCL 25 MG PO CAPS
50.0000 mg | ORAL_CAPSULE | Freq: Once | ORAL | Status: AC
  Administered 2020-06-10: 50 mg via ORAL

## 2020-06-10 MED ORDER — SODIUM CHLORIDE 0.9% FLUSH
10.0000 mL | INTRAVENOUS | Status: DC | PRN
  Administered 2020-06-10: 10 mL
  Filled 2020-06-10: qty 10

## 2020-06-10 MED ORDER — PROCHLORPERAZINE MALEATE 10 MG PO TABS
10.0000 mg | ORAL_TABLET | Freq: Once | ORAL | Status: AC
  Administered 2020-06-10: 10 mg via ORAL

## 2020-06-10 MED ORDER — HEPARIN SOD (PORK) LOCK FLUSH 100 UNIT/ML IV SOLN
500.0000 [IU] | Freq: Once | INTRAVENOUS | Status: AC | PRN
  Administered 2020-06-10: 500 [IU]
  Filled 2020-06-10: qty 5

## 2020-06-10 MED ORDER — ACETAMINOPHEN 325 MG PO TABS
ORAL_TABLET | ORAL | Status: AC
  Filled 2020-06-10: qty 2

## 2020-06-10 MED ORDER — SODIUM CHLORIDE 0.9 % IV SOLN
Freq: Once | INTRAVENOUS | Status: AC
  Filled 2020-06-10: qty 250

## 2020-06-10 MED ORDER — DIPHENHYDRAMINE HCL 25 MG PO CAPS
ORAL_CAPSULE | ORAL | Status: AC
  Filled 2020-06-10: qty 2

## 2020-06-10 MED ORDER — ACETAMINOPHEN 325 MG PO TABS
650.0000 mg | ORAL_TABLET | Freq: Once | ORAL | Status: AC
  Administered 2020-06-10: 650 mg via ORAL

## 2020-06-10 MED ORDER — PROCHLORPERAZINE MALEATE 10 MG PO TABS
ORAL_TABLET | ORAL | Status: AC
  Filled 2020-06-10: qty 1

## 2020-06-10 NOTE — Telephone Encounter (Signed)
06/10/20 los noted, pt is already on sch for 6/1 and 6/23    Rissie Sculley

## 2020-06-10 NOTE — Patient Instructions (Signed)
[facility]  Discharge Instructions: Thank you for choosing [facility] to provide your oncology and hematology care.   If you have a lab appointment with the [facility], please go directly to the [facility] and check in at the registration area.  Wear comfortable clothing and clothing appropriate for easy access to any Portacath or PICC line.   We strive to give you quality time with your provider. You may need to reschedule your appointment if you arrive late (15 or more minutes).  Arriving late affects you and other patients whose appointments are after yours.  Also, if you miss three or more appointments without notifying the office, you may be dismissed from the clinic at the provider's discretion.      For prescription refill requests, have your pharmacy contact our office and allow 72 hours for refills to be completed.    Today you received the following chemotherapy and/or immunotherapy agents Blenrep      To help prevent nausea and vomiting after your treatment, we encourage you to take your nausea medication as directed.  BELOW ARE SYMPTOMS THAT SHOULD BE REPORTED IMMEDIATELY: . *FEVER GREATER THAN 100.4 F (38 C) OR HIGHER . *CHILLS OR SWEATING . *NAUSEA AND VOMITING THAT IS NOT CONTROLLED WITH YOUR NAUSEA MEDICATION . *UNUSUAL SHORTNESS OF BREATH . *UNUSUAL BRUISING OR BLEEDING . *URINARY PROBLEMS (pain or burning when urinating, or frequent urination) . *BOWEL PROBLEMS (unusual diarrhea, constipation, pain near the anus) . TENDERNESS IN MOUTH AND THROAT WITH OR WITHOUT PRESENCE OF ULCERS (sore throat, sores in mouth, or a toothache) . UNUSUAL RASH, SWELLING OR PAIN  . UNUSUAL VAGINAL DISCHARGE OR ITCHING   Items with * indicate a potential emergency and should be followed up as soon as possible or go to the Emergency Department if any problems should occur.  Please show the CHEMOTHERAPY ALERT CARD or IMMUNOTHERAPY ALERT CARD at  check-in to the Emergency Department and triage nurse. Should you have questions after your visit or need to cancel or reschedule your appointment, please contact [facility]  [phone] and follow the prompts.  Office hours are 8:00 a.m. to 4:30 p.m. Monday - Friday. Please note that voicemails left after 4:00 p.m. may not be returned until the following business day.  We are closed weekends and major holidays. You have access to a nurse at all times for urgent questions. Please call the main number to the clinic [phone] and follow the prompts.  For any non-urgent questions, you may also contact your provider using MyChart. We now offer e-Visits for anyone 69 and older to request care online for non-urgent symptoms. For details visit [email or web address].   Also download the MyChart app! Go to the app store, search "MyChart", open the app, select [facility], and log in with your MyChart username and password.  Due to Covid, a mask is required upon entering the hospital/clinic. If you do not have a mask, one will be given to you upon arrival. For doctor visits, patients may have 1 support person aged 45 or older with them. For treatment visits, patients cannot have anyone with them due to current Covid guidelines and our immunocompromised population.

## 2020-06-10 NOTE — Patient Instructions (Signed)
Implanted Port Insertion, Care After This sheet gives you information about how to care for yourself after your procedure. Your health care provider may also give you more specific instructions. If you have problems or questions, contact your health care provider. What can I expect after the procedure? After the procedure, it is common to have:  Discomfort at the port insertion site.  Bruising on the skin over the port. This should improve over 3-4 days. Follow these instructions at home: Port care  After your port is placed, you will get a manufacturer's information card. The card has information about your port. Keep this card with you at all times.  Take care of the port as told by your health care provider. Ask your health care provider if you or a family member can get training for taking care of the port at home. A home health care nurse may also take care of the port.  Make sure to remember what type of port you have. Incision care  Follow instructions from your health care provider about how to take care of your port insertion site. Make sure you: ? Wash your hands with soap and water before and after you change your bandage (dressing). If soap and water are not available, use hand sanitizer. ? Change your dressing as told by your health care provider. ? Leave stitches (sutures), skin glue, or adhesive strips in place. These skin closures may need to stay in place for 2 weeks or longer. If adhesive strip edges start to loosen and curl up, you may trim the loose edges. Do not remove adhesive strips completely unless your health care provider tells you to do that.  Check your port insertion site every day for signs of infection. Check for: ? Redness, swelling, or pain. ? Fluid or blood. ? Warmth. ? Pus or a bad smell.      Activity  Return to your normal activities as told by your health care provider. Ask your health care provider what activities are safe for you.  Do not  lift anything that is heavier than 10 lb (4.5 kg), or the limit that you are told, until your health care provider says that it is safe. General instructions  Take over-the-counter and prescription medicines only as told by your health care provider.  Do not take baths, swim, or use a hot tub until your health care provider approves. Ask your health care provider if you may take showers. You may only be allowed to take sponge baths.  Do not drive for 24 hours if you were given a sedative during your procedure.  Wear a medical alert bracelet in case of an emergency. This will tell any health care providers that you have a port.  Keep all follow-up visits as told by your health care provider. This is important. Contact a health care provider if:  You cannot flush your port with saline as directed, or you cannot draw blood from the port.  You have a fever or chills.  You have redness, swelling, or pain around your port insertion site.  You have fluid or blood coming from your port insertion site.  Your port insertion site feels warm to the touch.  You have pus or a bad smell coming from the port insertion site. Get help right away if:  You have chest pain or shortness of breath.  You have bleeding from your port that you cannot control. Summary  Take care of the port as told by your   health care provider. Keep the manufacturer's information card with you at all times.  Change your dressing as told by your health care provider.  Contact a health care provider if you have a fever or chills or if you have redness, swelling, or pain around your port insertion site.  Keep all follow-up visits as told by your health care provider. This information is not intended to replace advice given to you by your health care provider. Make sure you discuss any questions you have with your health care provider. Document Revised: 08/14/2017 Document Reviewed: 08/14/2017 Elsevier Patient Education   2021 [employer] Inc.  

## 2020-06-10 NOTE — Progress Notes (Signed)
Hematology and Oncology Follow Up Visit  Emily Greer [id number] 07-12-1950 70 y.o. 06/10/2020   Principle Diagnosis:  Recurrent lambda light chain myeloma - nl cytogenetics History of recurrent endometrial carcinoma Iron deficiency anemia-blood loss  PastTherapy: Status post second autologous stem cell transplant on 07/24/2014 Maintenance therapy with Pomalidomide/every 2 week Velcade - d/c'ed Radiation therapy for endometrial recurrence - completed 04/20/2015 Pomalyst/Kyprolis 70mg /m2 IV q 2 weeks - s/p cycle #12 - held on 12/26/2017 for hematuria Daratumumab/Pomalyst (1 mg) - status post cycle19 -- d/c on 08/21/2019  Current Therapy: Melflufen 40 mg IV q 4 weeks -- started on 08/27/2019, s/p cycle #2 --  D/c due to FDA removal Selinexor 60 mg po q week -- start on 01/19/2020 -- changed on 03/18/2020 -- d/c on 04/27/2020 Blenrep 2.5 mg/m2 IV q 3 week -- s/p cycle #1 - start on 05/20/2020 Femara 2.5 mg po q day Xgeva 120 mg subcu every 3 months - next dose in 07/2020 IV iron as indicated   Interim History:  Emily Greer is here today for follow-up.  We have started her on Blenrep.  She has had 1 cycle so far.  She tolerated this well.  There has been no ocular issues.  She has had no ocular pain.  She has little bit of blurry vision.  I think that she may have some cataracts.  She has had no issues with nausea or vomiting.  She has had no issues with cough or shortness of breath.  She has had no change in bowel or bladder habits.  She has had no fever.  There is been no leg swelling.  She has had no rashes.  Overall, her performance status is ECOG 1.   Medications:  Allergies as of 06/10/2020      Reactions   Codeine Nausea Only      Medication List       Accurate as of Jun 10, 2020 11:49 AM. If you have any questions, ask your nurse or doctor.        acyclovir 400 MG tablet Commonly known as: ZOVIRAX TAKE 1 TABLET(400 MG) BY MOUTH  TWICE DAILY   albuterol 108 (90 Base) MCG/ACT inhaler Commonly known as: VENTOLIN HFA Inhale 2 puffs into the lungs every 6 (six) hours as needed for wheezing or shortness of breath. 2 puffs 3 times daily x 5 days then every 6 hours as needed.   amLODipine 10 MG tablet Commonly known as: NORVASC TAKE 1 TABLET(10 MG) BY MOUTH EVERY MORNING   aspirin EC 81 MG tablet Take 81 mg by mouth daily.   CALCIUM 1200+D3 PO Take 1 tablet by mouth daily.   Carboxymethylcell-Glycerin PF 0.5-0.9 % Soln Place 2 drops into both eyes 4 (four) times daily.   Fluocinolone Acetonide Scalp 0.01 % Oil Apply topically.   hydrochlorothiazide 12.5 MG capsule Commonly known as: MICROZIDE Take 12.5 mg by mouth every morning.   letrozole 2.5 MG tablet Commonly known as: FEMARA Take 1 tablet (2.5 mg total) by mouth daily.   lidocaine-prilocaine cream Commonly known as: EMLA Apply 1 application topically as needed.   loperamide 2 MG capsule Commonly known as: IMODIUM Take by mouth as needed for diarrhea or loose stools. Reported on 08/19/2015   loratadine 10 MG tablet Commonly known as: CLARITIN Take 10 mg by mouth every morning. Reported on 08/19/2015   metoprolol succinate 50 MG 24 hr tablet Commonly known as: TOPROL-XL Take 1 tablet (50 mg total) by mouth every morning.  Minoxidil for Men 5 % Soln Generic drug: MINOXIDIL (TOPICAL) SMARTSIG:Sparingly Topical Every Night   montelukast 10 MG tablet Commonly known as: SINGULAIR TAKE 1 TABLET(10 MG) BY MOUTH AT BEDTIME   ondansetron 8 MG tablet Commonly known as: Zofran Take 1 tablet (8 mg total) by mouth 2 (two) times daily as needed (Nausea or vomiting).   potassium chloride SA 20 MEQ tablet Commonly known as: KLOR-CON TAKE 1 TABLET(20 MEQ) BY MOUTH TWICE DAILY   PROBIOTIC DAILY PO Take by mouth daily.   triamcinolone 55 MCG/ACT Aero nasal inhaler Commonly known as: NASACORT Place 2 sprays into the nose as needed.    triamterene-hydrochlorothiazide 37.5-25 MG tablet Commonly known as: MAXZIDE-25 1 tablet by mouth at bedtime   Vitamin D3 50 MCG (2000 UT) Tabs Take 2 tablets by mouth daily.       Allergies:  Allergies  Allergen Reactions  . Codeine Nausea Only    Past Medical History, Surgical history, Social history, and Family History were reviewed and updated.  Review of Systems: Review of Systems  Constitutional: Negative.   HENT: Negative.   Eyes: Negative.   Respiratory: Negative.   Cardiovascular: Negative.   Gastrointestinal: Negative.   Genitourinary: Negative.   Musculoskeletal: Negative.   Skin: Negative.   Neurological: Negative.   Endo/Heme/Allergies: Negative.   Psychiatric/Behavioral: Negative.       Physical Exam:  weight is 176 lb (79.8 kg). Her oral temperature is 98.9 F (37.2 C). Her blood pressure is 121/61 and her pulse is 82. Her respiration is 16 and oxygen saturation is 100%.   Wt Readings from Last 3 Encounters:  06/10/20 176 lb (79.8 kg)  05/20/20 177 lb (80.3 kg)  05/20/20 177 lb (80.3 kg)    Physical Exam Vitals reviewed.  HENT:     Head: Normocephalic and atraumatic.  Eyes:     Pupils: Pupils are equal, round, and reactive to light.  Cardiovascular:     Rate and Rhythm: Normal rate and regular rhythm.     Heart sounds: Normal heart sounds.  Pulmonary:     Effort: Pulmonary effort is normal.     Breath sounds: Normal breath sounds.  Abdominal:     General: Bowel sounds are normal.     Palpations: Abdomen is soft.  Musculoskeletal:        General: No tenderness or deformity. Normal range of motion.     Cervical back: Normal range of motion.  Lymphadenopathy:     Cervical: No cervical adenopathy.  Skin:    General: Skin is warm and dry.     Findings: No erythema or rash.  Neurological:     Mental Status: She is alert and oriented to person, place, and time.  Psychiatric:        Behavior: Behavior normal.        Thought Content:  Thought content normal.        Judgment: Judgment normal.      Lab Results  Component Value Date   WBC 3.9 (L) 06/10/2020   HGB 8.7 (L) 06/10/2020   HCT 27.3 (L) 06/10/2020   MCV 88.9 06/10/2020   PLT 100 (L) 06/10/2020   Lab Results  Component Value Date   FERRITIN 2,472 (H) 04/27/2020   IRON 98 04/27/2020   TIBC 361 04/27/2020   UIBC 263 04/27/2020   IRONPCTSAT 27 04/27/2020   Lab Results  Component Value Date   RETICCTPCT 1.6 11/05/2019   RBC 3.07 (L) 06/10/2020   Lab Results  Component  Value Date   KPAFRELGTCHN 5.3 05/20/2020   LAMBDASER 368.9 (H) 05/20/2020   KAPLAMBRATIO 0.01 (L) 05/20/2020   Lab Results  Component Value Date   IGGSERUM 511 (L) 05/20/2020   IGA 44 (L) 05/20/2020   IGMSERUM <5 (L) 05/20/2020   Lab Results  Component Value Date   TOTALPROTELP 6.2 05/20/2020   ALBUMINELP 3.6 05/20/2020   A1GS 0.2 05/20/2020   A2GS 0.7 05/20/2020   BETS 1.0 05/20/2020   BETA2SER 0.4 11/23/2014   GAMS 0.4 05/20/2020   MSPIKE Not Observed 05/20/2020   SPEI Comment 05/20/2020     Chemistry      Component Value Date/Time   NA 137 06/10/2020 1105   NA 141 01/10/2017 1115   NA 140 06/21/2016 0918   K 3.6 06/10/2020 1105   K 4.0 01/10/2017 1115   K 4.3 06/21/2016 0918   CL 104 06/10/2020 1105   CL 106 01/10/2017 1115   CO2 26 06/10/2020 1105   CO2 27 01/10/2017 1115   CO2 20 (L) 06/21/2016 0918   BUN 16 06/10/2020 1105   BUN 15 01/10/2017 1115   BUN 15.8 06/21/2016 0918   CREATININE 0.78 06/10/2020 1105   CREATININE 1.0 01/10/2017 1115   CREATININE 0.8 06/21/2016 0918      Component Value Date/Time   CALCIUM 9.5 06/10/2020 1105   CALCIUM 9.5 01/10/2017 1115   CALCIUM 9.4 06/21/2016 0918   ALKPHOS 41 06/10/2020 1105   ALKPHOS 40 01/10/2017 1115   ALKPHOS 66 06/21/2016 0918   AST 16 06/10/2020 1105   AST 17 06/21/2016 0918   ALT 13 06/10/2020 1105   ALT 19 01/10/2017 1115   ALT 37 06/21/2016 0918   BILITOT 0.3 06/10/2020 1105   BILITOT  0.32 06/21/2016 0918       Impression and Plan: Ms. Devine is a very pleasant 70 yo African American female with recurrent lambda light chain myeloma. Her second stem cell transplant for light chain myeloma was in June 2016.   Shealsohaslocalized recurrent endometrial cancer (followed by gyn onc Dr. Nelly Rout) and is currently on Femara.   Hopefully, we are seeing a response to the Blenrep.  We will have to see what her myeloma studies show.  She is tolerated the Blenrep nicely to date.  For right now, we will plan to get her back in another 3 weeks.        Josph Macho, MD 5/12/202211:49 AM

## 2020-06-11 ENCOUNTER — Encounter: Payer: Self-pay | Admitting: *Deleted

## 2020-06-11 LAB — KAPPA/LAMBDA LIGHT CHAINS
Kappa free light chain: 1.6 mg/L — ABNORMAL LOW (ref 3.3–19.4)
Kappa, lambda light chain ratio: 0.01 — ABNORMAL LOW (ref 0.26–1.65)
Lambda free light chains: 141.7 mg/L — ABNORMAL HIGH (ref 5.7–26.3)

## 2020-06-11 LAB — IGG, IGA, IGM
IgA: 20 mg/dL — ABNORMAL LOW (ref 87–352)
IgG (Immunoglobin G), Serum: 527 mg/dL — ABNORMAL LOW (ref 586–1602)
IgM (Immunoglobulin M), Srm: <5 mg/dL — ABNORMAL LOW (ref 26–217)

## 2020-06-14 LAB — PROTEIN ELECTROPHORESIS, SERUM, WITH REFLEX
A/G Ratio: 1.7 (ref 0.7–1.7)
Albumin ELP: 3.8 g/dL (ref 2.9–4.4)
Alpha-1-Globulin: 0.2 g/dL (ref 0.0–0.4)
Alpha-2-Globulin: 0.7 g/dL (ref 0.4–1.0)
Beta Globulin: 1.1 g/dL (ref 0.7–1.3)
Gamma Globulin: 0.4 g/dL (ref 0.4–1.8)
Globulin, Total: 2.3 g/dL (ref 2.2–3.9)
Total Protein ELP: 6.1 g/dL (ref 6.0–8.5)

## 2020-06-18 ENCOUNTER — Encounter: Payer: Self-pay | Admitting: *Deleted

## 2020-06-18 DIAGNOSIS — H04123 Dry eye syndrome of bilateral lacrimal glands: Secondary | ICD-10-CM | POA: Diagnosis not present

## 2020-06-18 DIAGNOSIS — H25813 Combined forms of age-related cataract, bilateral: Secondary | ICD-10-CM | POA: Diagnosis not present

## 2020-06-18 DIAGNOSIS — H40033 Anatomical narrow angle, bilateral: Secondary | ICD-10-CM | POA: Diagnosis not present

## 2020-06-18 DIAGNOSIS — H35033 Hypertensive retinopathy, bilateral: Secondary | ICD-10-CM | POA: Diagnosis not present

## 2020-06-18 NOTE — Progress Notes (Signed)
Blenrep PSF completed after patients visit today to [facility]. Authorization code generated for upcoming treatment

## 2020-06-21 DIAGNOSIS — L659 Nonscarring hair loss, unspecified: Secondary | ICD-10-CM | POA: Diagnosis not present

## 2020-06-30 ENCOUNTER — Inpatient Hospital Stay: Payer: Medicare Other

## 2020-06-30 ENCOUNTER — Encounter: Payer: Self-pay | Admitting: Hematology & Oncology

## 2020-06-30 ENCOUNTER — Other Ambulatory Visit: Payer: Self-pay

## 2020-06-30 ENCOUNTER — Inpatient Hospital Stay (HOSPITAL_BASED_OUTPATIENT_CLINIC_OR_DEPARTMENT_OTHER): Payer: Medicare Other | Admitting: Hematology & Oncology

## 2020-06-30 ENCOUNTER — Inpatient Hospital Stay: Payer: Medicare Other | Attending: Hematology & Oncology

## 2020-06-30 VITALS — BP 162/75 | HR 71 | Temp 99.2°F | Resp 17 | Wt 175.0 lb

## 2020-06-30 DIAGNOSIS — Z923 Personal history of irradiation: Secondary | ICD-10-CM | POA: Insufficient documentation

## 2020-06-30 DIAGNOSIS — Z8542 Personal history of malignant neoplasm of other parts of uterus: Secondary | ICD-10-CM | POA: Insufficient documentation

## 2020-06-30 DIAGNOSIS — Z5112 Encounter for antineoplastic immunotherapy: Secondary | ICD-10-CM | POA: Insufficient documentation

## 2020-06-30 DIAGNOSIS — C9000 Multiple myeloma not having achieved remission: Secondary | ICD-10-CM

## 2020-06-30 DIAGNOSIS — Z79899 Other long term (current) drug therapy: Secondary | ICD-10-CM | POA: Diagnosis not present

## 2020-06-30 DIAGNOSIS — Z79811 Long term (current) use of aromatase inhibitors: Secondary | ICD-10-CM | POA: Diagnosis not present

## 2020-06-30 DIAGNOSIS — Z885 Allergy status to narcotic agent status: Secondary | ICD-10-CM | POA: Insufficient documentation

## 2020-06-30 DIAGNOSIS — D509 Iron deficiency anemia, unspecified: Secondary | ICD-10-CM | POA: Insufficient documentation

## 2020-06-30 LAB — CBC WITH DIFFERENTIAL (CANCER CENTER ONLY)
Abs Immature Granulocytes: 0.03 10*3/uL (ref 0.00–0.07)
Basophils Absolute: 0 10*3/uL (ref 0.0–0.1)
Basophils Relative: 1 %
Eosinophils Absolute: 0.1 10*3/uL (ref 0.0–0.5)
Eosinophils Relative: 2 %
HCT: 28.1 % — ABNORMAL LOW (ref 36.0–46.0)
Hemoglobin: 9 g/dL — ABNORMAL LOW (ref 12.0–15.0)
Immature Granulocytes: 1 %
Lymphocytes Relative: 9 %
Lymphs Abs: 0.4 10*3/uL — ABNORMAL LOW (ref 0.7–4.0)
MCH: 27.5 pg (ref 26.0–34.0)
MCHC: 32 g/dL (ref 30.0–36.0)
MCV: 85.9 fL (ref 80.0–100.0)
Monocytes Absolute: 0.5 10*3/uL (ref 0.1–1.0)
Monocytes Relative: 11 %
Neutro Abs: 3.7 10*3/uL (ref 1.7–7.7)
Neutrophils Relative %: 76 %
Platelet Count: 118 10*3/uL — ABNORMAL LOW (ref 150–400)
RBC: 3.27 MIL/uL — ABNORMAL LOW (ref 3.87–5.11)
RDW: 16.5 % — ABNORMAL HIGH (ref 11.5–15.5)
WBC Count: 4.8 10*3/uL (ref 4.0–10.5)
nRBC: 0 % (ref 0.0–0.2)

## 2020-06-30 LAB — CMP (CANCER CENTER ONLY)
ALT: 17 U/L (ref 0–44)
AST: 22 U/L (ref 15–41)
Albumin: 4.2 g/dL (ref 3.5–5.0)
Alkaline Phosphatase: 60 U/L (ref 38–126)
Anion gap: 10 (ref 5–15)
BUN: 18 mg/dL (ref 8–23)
CO2: 28 mmol/L (ref 22–32)
Calcium: 10.7 mg/dL — ABNORMAL HIGH (ref 8.9–10.3)
Chloride: 103 mmol/L (ref 98–111)
Creatinine: 0.91 mg/dL (ref 0.44–1.00)
GFR, Estimated: >60 mL/min (ref >60)
Glucose, Bld: 97 mg/dL (ref 70–99)
Potassium: 3.8 mmol/L (ref 3.5–5.1)
Sodium: 141 mmol/L (ref 135–145)
Total Bilirubin: 0.3 mg/dL (ref 0.3–1.2)
Total Protein: 6.8 g/dL (ref 6.5–8.1)

## 2020-06-30 LAB — LACTATE DEHYDROGENASE: LDH: 333 U/L — ABNORMAL HIGH (ref 98–192)

## 2020-06-30 MED ORDER — PROCHLORPERAZINE MALEATE 10 MG PO TABS
ORAL_TABLET | ORAL | Status: AC
  Filled 2020-06-30: qty 1

## 2020-06-30 MED ORDER — SODIUM CHLORIDE 0.9 % IV SOLN
2.5000 mg/kg | Freq: Once | INTRAVENOUS | Status: AC
  Administered 2020-06-30: 200 mg via INTRAVENOUS
  Filled 2020-06-30: qty 4

## 2020-06-30 MED ORDER — SODIUM CHLORIDE 0.9% FLUSH
10.0000 mL | INTRAVENOUS | Status: DC | PRN
  Administered 2020-06-30: 10 mL
  Filled 2020-06-30: qty 10

## 2020-06-30 MED ORDER — DIPHENHYDRAMINE HCL 25 MG PO CAPS
50.0000 mg | ORAL_CAPSULE | Freq: Once | ORAL | Status: AC
Start: 2020-06-30 — End: 2020-06-30
  Administered 2020-06-30: 50 mg via ORAL

## 2020-06-30 MED ORDER — ACETAMINOPHEN 325 MG PO TABS
650.0000 mg | ORAL_TABLET | Freq: Once | ORAL | Status: AC
  Administered 2020-06-30: 650 mg via ORAL

## 2020-06-30 MED ORDER — HEPARIN SOD (PORK) LOCK FLUSH 100 UNIT/ML IV SOLN
500.0000 [IU] | Freq: Once | INTRAVENOUS | Status: AC | PRN
  Administered 2020-06-30: 500 [IU]
  Filled 2020-06-30: qty 5

## 2020-06-30 MED ORDER — PROCHLORPERAZINE MALEATE 10 MG PO TABS
10.0000 mg | ORAL_TABLET | Freq: Once | ORAL | Status: AC
  Administered 2020-06-30: 10 mg via ORAL

## 2020-06-30 MED ORDER — ACETAMINOPHEN 325 MG PO TABS
ORAL_TABLET | ORAL | Status: AC
  Filled 2020-06-30: qty 2

## 2020-06-30 MED ORDER — SODIUM CHLORIDE 0.9 % IV SOLN
Freq: Once | INTRAVENOUS | Status: AC
  Filled 2020-06-30: qty 250

## 2020-06-30 MED ORDER — DIPHENHYDRAMINE HCL 25 MG PO CAPS
ORAL_CAPSULE | ORAL | Status: AC
  Filled 2020-06-30: qty 2

## 2020-06-30 NOTE — Patient Instructions (Signed)
[facility]  Discharge Instructions: Thank you for choosing [facility] to provide your oncology and hematology care.   If you have a lab appointment with the [facility], please go directly to the [facility] and check in at the registration area.  Wear comfortable clothing and clothing appropriate for easy access to any Portacath or PICC line.   We strive to give you quality time with your provider. You may need to reschedule your appointment if you arrive late (15 or more minutes).  Arriving late affects you and other patients whose appointments are after yours.  Also, if you miss three or more appointments without notifying the office, you may be dismissed from the clinic at the provider's discretion.      For prescription refill requests, have your pharmacy contact our office and allow 72 hours for refills to be completed.    Today you received the following chemotherapy and/or immunotherapy agents Blenrep    To help prevent nausea and vomiting after your treatment, we encourage you to take your nausea medication as directed.  BELOW ARE SYMPTOMS THAT SHOULD BE REPORTED IMMEDIATELY: . *FEVER GREATER THAN 100.4 F (38 C) OR HIGHER . *CHILLS OR SWEATING . *NAUSEA AND VOMITING THAT IS NOT CONTROLLED WITH YOUR NAUSEA MEDICATION . *UNUSUAL SHORTNESS OF BREATH . *UNUSUAL BRUISING OR BLEEDING . *URINARY PROBLEMS (pain or burning when urinating, or frequent urination) . *BOWEL PROBLEMS (unusual diarrhea, constipation, pain near the anus) . TENDERNESS IN MOUTH AND THROAT WITH OR WITHOUT PRESENCE OF ULCERS (sore throat, sores in mouth, or a toothache) . UNUSUAL RASH, SWELLING OR PAIN  . UNUSUAL VAGINAL DISCHARGE OR ITCHING   Items with * indicate a potential emergency and should be followed up as soon as possible or go to the Emergency Department if any problems should occur.  Please show the CHEMOTHERAPY ALERT CARD or IMMUNOTHERAPY ALERT CARD at  check-in to the Emergency Department and triage nurse. Should you have questions after your visit or need to cancel or reschedule your appointment, please contact [facility]  [phone] and follow the prompts.  Office hours are 8:00 a.m. to 4:30 p.m. Monday - Friday. Please note that voicemails left after 4:00 p.m. may not be returned until the following business day.  We are closed weekends and major holidays. You have access to a nurse at all times for urgent questions. Please call the main number to the clinic [phone] and follow the prompts.  For any non-urgent questions, you may also contact your provider using MyChart. We now offer e-Visits for anyone 59 and older to request care online for non-urgent symptoms. For details visit [email or web address].   Also download the MyChart app! Go to the app store, search "MyChart", open the app, select [facility], and log in with your MyChart username and password.  Due to Covid, a mask is required upon entering the hospital/clinic. If you do not have a mask, one will be given to you upon arrival. For doctor visits, patients may have 1 support person aged 54 or older with them. For treatment visits, patients cannot have anyone with them due to current Covid guidelines and our immunocompromised population.

## 2020-06-30 NOTE — Patient Instructions (Signed)

## 2020-06-30 NOTE — Progress Notes (Signed)
Hematology and Oncology Follow Up Visit  ROMEKA Greer [id number] 06-14-1950 70 y.o. 06/30/2020   Principle Diagnosis:  Recurrent lambda light chain myeloma - nl cytogenetics History of recurrent endometrial carcinoma Iron deficiency anemia-blood loss  PastTherapy: Status post second autologous stem cell transplant on 07/24/2014 Maintenance therapy with Pomalidomide/every 2 week Velcade - d/c'ed Radiation therapy for endometrial recurrence - completed 04/20/2015 Pomalyst/Kyprolis 70mg /m2 IV q 2 weeks - s/p cycle #12 - held on 12/26/2017 for hematuria Daratumumab/Pomalyst (1 mg) - status post cycle19 -- d/c on 08/21/2019  Current Therapy: Melflufen 40 mg IV q 4 weeks -- started on 08/27/2019, s/p cycle #2 --  D/c due to FDA removal Selinexor 60 mg po q week -- start on 01/19/2020 -- changed on 03/18/2020 -- d/c on 04/27/2020 Blenrep 2.5 mg/m2 IV q 3 week -- s/p cycle #2 - start on 05/20/2020 Femara 2.5 mg po q day Xgeva 120 mg subcu every 3 months - next dose in 07/2020 IV iron as indicated   Interim History:  Ms. [name] is here today for follow-up.  So far, everything is going quite nicely.  Her lambda light chains come down quite well.  We last saw her 3 weeks ago, the lambda light chain was down to 14.2 mg/dL.  Previously it was 37 mg/dL.  She has tolerated the treatment well so far.  She has had her ocular exams.  She has a cataract which is causing her problems.  Her ophthalmologist says that there is been no effects from the [facility].  She is quite tired.  She just got back from her 50th high school reunion down in [place].  She had a wonderful time down there.  She has had about 200 people where they are.  I am so happy for her.  Her 2 grandsons will be visiting this weekend.  She is looking forward to this.  She has not had any bony pain.  There is no nausea or vomiting.  She has had no change in bowel or bladder habits.  She has had  little bit of swelling in the legs.  There is no bleeding.  She has had no cough or shortness of breath.  Overall, her performance status is ECOG 1.     Medications:  Allergies as of 06/30/2020      Reactions   Codeine Nausea Only      Medication List       Accurate as of June 30, 2020 10:54 AM. If you have any questions, ask your nurse or doctor.        acyclovir 400 MG tablet Commonly known as: ZOVIRAX TAKE 1 TABLET(400 MG) BY MOUTH TWICE DAILY   albuterol 108 (90 Base) MCG/ACT inhaler Commonly known as: VENTOLIN HFA Inhale 2 puffs into the lungs every 6 (six) hours as needed for wheezing or shortness of breath. 2 puffs 3 times daily x 5 days then every 6 hours as needed.   amLODipine 10 MG tablet Commonly known as: NORVASC TAKE 1 TABLET(10 MG) BY MOUTH EVERY MORNING   aspirin EC 81 MG tablet Take 81 mg by mouth daily.   CALCIUM 1200+D3 PO Take 1 tablet by mouth daily.   Carboxymethylcell-Glycerin PF 0.5-0.9 % Soln Place 2 drops into both eyes 4 (four) times daily.   Fluocinolone Acetonide Scalp 0.01 % Oil Apply topically.   hydrochlorothiazide 12.5 MG capsule Commonly known as: MICROZIDE Take 12.5 mg by mouth every morning.   letrozole 2.5 MG tablet Commonly known as: [facility]  Take 1 tablet (2.5 mg total) by mouth daily.   lidocaine-prilocaine cream Commonly known as: EMLA Apply 1 application topically as needed.   loperamide 2 MG capsule Commonly known as: IMODIUM Take by mouth as needed for diarrhea or loose stools. Reported on 08/19/2015   loratadine 10 MG tablet Commonly known as: CLARITIN Take 10 mg by mouth every morning. Reported on 08/19/2015   metoprolol succinate 50 MG 24 hr tablet Commonly known as: TOPROL-XL Take 1 tablet (50 mg total) by mouth every morning.   Minoxidil for Men 5 % Soln Generic drug: MINOXIDIL (TOPICAL) SMARTSIG:Sparingly Topical Every Night   montelukast 10 MG tablet Commonly known as: SINGULAIR TAKE 1 TABLET(10  MG) BY MOUTH AT BEDTIME   ondansetron 8 MG tablet Commonly known as: Zofran Take 1 tablet (8 mg total) by mouth 2 (two) times daily as needed (Nausea or vomiting).   potassium chloride SA 20 MEQ tablet Commonly known as: KLOR-CON TAKE 1 TABLET(20 MEQ) BY MOUTH TWICE DAILY   PROBIOTIC DAILY PO Take by mouth daily.   triamcinolone 55 MCG/ACT Aero nasal inhaler Commonly known as: NASACORT Place 2 sprays into the nose as needed.   triamterene-hydrochlorothiazide 37.5-25 MG tablet Commonly known as: MAXZIDE-25 1 tablet by mouth at bedtime   Vitamin D3 50 MCG (2000 UT) Tabs Take 2 tablets by mouth daily.   zinc gluconate 50 MG tablet Take 50 mg by mouth daily.       Allergies:  Allergies  Allergen Reactions  . Codeine Nausea Only    Past Medical History, Surgical history, Social history, and Family History were reviewed and updated.  Review of Systems: Review of Systems  Constitutional: Negative.   HENT: Negative.   Eyes: Negative.   Respiratory: Negative.   Cardiovascular: Negative.   Gastrointestinal: Negative.   Genitourinary: Negative.   Musculoskeletal: Negative.   Skin: Negative.   Neurological: Negative.   Endo/Heme/Allergies: Negative.   Psychiatric/Behavioral: Negative.       Physical Exam:  weight is 175 lb (79.4 kg). Her oral temperature is 99.2 F (37.3 C). Her blood pressure is 162/75 (abnormal) and her pulse is 71. Her respiration is 17 and oxygen saturation is 100%.   Wt Readings from Last 3 Encounters:  06/30/20 175 lb (79.4 kg)  06/10/20 176 lb (79.8 kg)  05/20/20 177 lb (80.3 kg)    Physical Exam Vitals reviewed.  HENT:     Head: Normocephalic and atraumatic.  Eyes:     Pupils: Pupils are equal, round, and reactive to light.  Cardiovascular:     Rate and Rhythm: Normal rate and regular rhythm.     Heart sounds: Normal heart sounds.  Pulmonary:     Effort: Pulmonary effort is normal.     Breath sounds: Normal breath sounds.   Abdominal:     General: Bowel sounds are normal.     Palpations: Abdomen is soft.  Musculoskeletal:        General: No tenderness or deformity. Normal range of motion.     Cervical back: Normal range of motion.  Lymphadenopathy:     Cervical: No cervical adenopathy.  Skin:    General: Skin is warm and dry.     Findings: No erythema or rash.  Neurological:     Mental Status: She is alert and oriented to person, place, and time.  Psychiatric:        Behavior: Behavior normal.        Thought Content: Thought content normal.  Judgment: Judgment normal.      Lab Results  Component Value Date   WBC 4.8 06/30/2020   HGB 9.0 (L) 06/30/2020   HCT 28.1 (L) 06/30/2020   MCV 85.9 06/30/2020   PLT 118 (L) 06/30/2020   Lab Results  Component Value Date   FERRITIN 2,472 (H) 04/27/2020   IRON 98 04/27/2020   TIBC 361 04/27/2020   UIBC 263 04/27/2020   IRONPCTSAT 27 04/27/2020   Lab Results  Component Value Date   RETICCTPCT 1.6 11/05/2019   RBC 3.27 (L) 06/30/2020   Lab Results  Component Value Date   KPAFRELGTCHN 1.6 (L) 06/10/2020   LAMBDASER 141.7 (H) 06/10/2020   KAPLAMBRATIO 0.01 (L) 06/10/2020   Lab Results  Component Value Date   IGGSERUM 527 (L) 06/10/2020   IGA 20 (L) 06/10/2020   IGMSERUM <5 (L) 06/10/2020   Lab Results  Component Value Date   TOTALPROTELP 6.1 06/10/2020   ALBUMINELP 3.8 06/10/2020   A1GS 0.2 06/10/2020   A2GS 0.7 06/10/2020   BETS 1.1 06/10/2020   BETA2SER 0.4 11/23/2014   GAMS 0.4 06/10/2020   MSPIKE Not Observed 06/10/2020   SPEI Comment 05/20/2020     Chemistry      Component Value Date/Time   NA 141 06/30/2020 0915   NA 141 01/10/2017 1115   NA 140 06/21/2016 0918   K 3.8 06/30/2020 0915   K 4.0 01/10/2017 1115   K 4.3 06/21/2016 0918   CL 103 06/30/2020 0915   CL 106 01/10/2017 1115   CO2 28 06/30/2020 0915   CO2 27 01/10/2017 1115   CO2 20 (L) 06/21/2016 0918   BUN 18 06/30/2020 0915   BUN 15 01/10/2017 1115    BUN 15.8 06/21/2016 0918   CREATININE 0.91 06/30/2020 0915   CREATININE 1.0 01/10/2017 1115   CREATININE 0.8 06/21/2016 0918      Component Value Date/Time   CALCIUM 10.7 (H) 06/30/2020 0915   CALCIUM 9.5 01/10/2017 1115   CALCIUM 9.4 06/21/2016 0918   ALKPHOS 60 06/30/2020 0915   ALKPHOS 40 01/10/2017 1115   ALKPHOS 66 06/21/2016 0918   AST 22 06/30/2020 0915   AST 17 06/21/2016 0918   ALT 17 06/30/2020 0915   ALT 19 01/10/2017 1115   ALT 37 06/21/2016 0918   BILITOT 0.3 06/30/2020 0915   BILITOT 0.32 06/21/2016 0918       Impression and Plan: Ms. Debona is a very pleasant 70 yo African American female with recurrent lambda light chain myeloma. Her second stem cell transplant for light chain myeloma was in June 2016.   Shealsohaslocalized recurrent endometrial cancer (followed by gyn onc Dr. Nelly Rout) and is currently on Femara.   Is nice to see that her light chains are coming down.  Hopefully we will continue to see a response.  We will go ahead with her third cycle of Blenrep.  I am just glad that her quality of life is doing well right now.        Josph Macho, MD 6/1/202210:54 AM

## 2020-07-01 ENCOUNTER — Telehealth: Payer: Self-pay

## 2020-07-01 LAB — IGG, IGA, IGM
IgA: 14 mg/dL — ABNORMAL LOW (ref 87–352)
IgG (Immunoglobin G), Serum: 473 mg/dL — ABNORMAL LOW (ref 586–1602)
IgM (Immunoglobulin M), Srm: <5 mg/dL — ABNORMAL LOW (ref 26–217)

## 2020-07-01 LAB — KAPPA/LAMBDA LIGHT CHAINS
Kappa free light chain: 3.4 mg/L (ref 3.3–19.4)
Kappa, lambda light chain ratio: 0.04 — ABNORMAL LOW (ref 0.26–1.65)
Lambda free light chains: 95.3 mg/L — ABNORMAL HIGH (ref 5.7–26.3)

## 2020-07-01 NOTE — Telephone Encounter (Signed)
06/30/20 los states 6/30 return appts, verified 6/23 appts  With vanessa and was verified to keeps these as this is correct sch at this time-does not line up with tx plan    Emily Greer

## 2020-07-04 ENCOUNTER — Other Ambulatory Visit: Payer: Self-pay | Admitting: Hematology & Oncology

## 2020-07-05 ENCOUNTER — Encounter: Payer: Self-pay | Admitting: Hematology & Oncology

## 2020-07-05 LAB — PROTEIN ELECTROPHORESIS, SERUM, WITH REFLEX
A/G Ratio: 1.2 (ref 0.7–1.7)
Albumin ELP: 3.5 g/dL (ref 2.9–4.4)
Alpha-1-Globulin: 0.3 g/dL (ref 0.0–0.4)
Alpha-2-Globulin: 1.1 g/dL — ABNORMAL HIGH (ref 0.4–1.0)
Beta Globulin: 1.2 g/dL (ref 0.7–1.3)
Gamma Globulin: 0.4 g/dL (ref 0.4–1.8)
Globulin, Total: 3 g/dL (ref 2.2–3.9)
M-Spike, %: 0.4 g/dL — ABNORMAL HIGH
SPEP Interpretation: 0
Total Protein ELP: 6.5 g/dL (ref 6.0–8.5)

## 2020-07-05 LAB — IMMUNOFIXATION REFLEX, SERUM
IgA: 14 mg/dL — ABNORMAL LOW (ref 87–352)
IgG (Immunoglobin G), Serum: 485 mg/dL — ABNORMAL LOW (ref 586–1602)
IgM (Immunoglobulin M), Srm: <5 mg/dL — ABNORMAL LOW (ref 26–217)

## 2020-07-09 ENCOUNTER — Telehealth: Payer: Self-pay

## 2020-07-09 NOTE — Telephone Encounter (Signed)
The pt was notified the office is closed today, that she needed to go have a covid test done, if her symptoms persist to go to the ER. The pt was to take some oscocssollinum

## 2020-07-13 ENCOUNTER — Other Ambulatory Visit: Payer: Self-pay | Admitting: Nurse Practitioner

## 2020-07-13 ENCOUNTER — Telehealth (INDEPENDENT_AMBULATORY_CARE_PROVIDER_SITE_OTHER): Payer: Medicare Other | Admitting: Nurse Practitioner

## 2020-07-13 VITALS — BP 147/74 | HR 107 | Temp 99.2°F

## 2020-07-13 DIAGNOSIS — J3489 Other specified disorders of nose and nasal sinuses: Secondary | ICD-10-CM | POA: Diagnosis not present

## 2020-07-13 DIAGNOSIS — R0981 Nasal congestion: Secondary | ICD-10-CM

## 2020-07-13 DIAGNOSIS — R059 Cough, unspecified: Secondary | ICD-10-CM | POA: Diagnosis not present

## 2020-07-13 DIAGNOSIS — Z20822 Contact with and (suspected) exposure to covid-19: Secondary | ICD-10-CM

## 2020-07-13 LAB — POC COVID19 BINAXNOW: SARS Coronavirus 2 Ag: NEGATIVE

## 2020-07-13 MED ORDER — BENZONATATE 100 MG PO CAPS: 100.0000 mg | ORAL_CAPSULE | Freq: Three times a day (TID) | ORAL | 0 refills | Status: DC | PRN

## 2020-07-13 MED ORDER — AMOXICILLIN-POT CLAVULANATE 875-125 MG PO TABS: 1.0000 | ORAL_TABLET | Freq: Two times a day (BID) | ORAL | 0 refills | Status: AC

## 2020-07-13 NOTE — Progress Notes (Signed)
Virtual Visit via 100% phone call due to failed video visit.    This visit type was conducted due to national recommendations for restrictions regarding the COVID-19 Pandemic (e.g. social distancing) in an effort to limit this patient's exposure and mitigate transmission in our community.  Due to her co-morbid illnesses, this patient is at least at moderate risk for complications without adequate follow up.  This format is felt to be most appropriate for this patient at this time.  All issues noted in this document were discussed and addressed.  A limited physical exam was performed with this format.    This visit type was conducted due to national recommendations for restrictions regarding the COVID-19 Pandemic (e.g. social distancing) in an effort to limit this patient's exposure and mitigate transmission in our community.  Patients identity confirmed using two different identifiers.  This format is felt to be most appropriate for this patient at this time.  All issues noted in this document were discussed and addressed.  No physical exam was performed (except for noted visual exam findings with Video Visits).    Date:  07/13/2020   ID:  Emily Greer, DOB 06-14-1950, MRN [id number]  Patient Location:  Home   Provider location:   Office  Chief Complaint: runny nose, some fever for the past couple of days. Slight congestion. No chills. Slight cough.   History of Present Illness:    Emily Greer is a 70 y.o. female who presents via video conferencing for a telehealth visit today.    The patient does have symptoms concerning for COVID-19 infection (fever, chills, cough, or new shortness of breath).   She has a runny nose . Symptoms started x 1 week ago, little cough at night. More of allergy symptoms. Delsym and Corcidin during the day. She took Covid test on 2 days ago.It was negative. No SOB. Double vaccinated and boosted. No chest pain. She is little congested at night. Low grade  fever for a couple of days. On and off. No chills no body aches. Emily Greer was sick but he is better and she still doesn't feel better.   Cough Associated symptoms include a fever. Pertinent negatives include no chills, ear pain, headaches, myalgias, sore throat, shortness of breath or wheezing.    Past Medical History:  Diagnosis Date   Avascular necrosis of femoral head (HCC)    bilateral per CT 07-26-2015   Endometrial carcinoma [facility]) gyn oncologist-  dr Nelly Rout ([facility])/  radiation oncologist-- dr Roselind Messier   2013 dx  FIGO Stage 1A, Grade 2 endometrioid endometrial cancer s/p TAH w/ BSO and bilateral pelvic node dissection 10-31-2011 ;  recurrence at distal vagina 04/ 2014 s/p  brachytherapy (ended 07-29-2012);  2nd recurrence 12/ 2016  vaginal apex s/p  conformational radiotherapy 03-10-2015 to 04-20-2015   Family history of adverse reaction to anesthesia    mother ponv   GERD (gastroesophageal reflux disease)    Goals of care, counseling/discussion 08/21/2019   H/O stem cell transplant (HCC)    02/ 2000 and second one 06/ 2016   History of bacteremia    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   History of chemotherapy    last chemo 12-26-17   History of radiation therapy 6/4, 6/11, 6/19, 6/25, 07/29/2012   vagina 30.5 gray in 5 fx, HDR brachytherapy:   last radiation to vagina 03-10-2015 to 04-20-2015  50.4gray   History of radiation therapy 03/10/15-04/20/15   vagina 50.4 in 28 fractions  Hypertension    Lambda light chain myeloma [facility]) oncologist-  dr Myna Hidalgo ([facility])  and  [facility] -- dr Silvestre Mesi gasparetto   dx 07/ 1999 s/p  VAD chemotherapy 11/ 1999,  purged autotransplant 03-21-1998 followed by auto stem cell transplant 03-29-1998;  recurrance w/ second autologous stem cell transplant 07-24-2014;  in Re-mission currently , chemo maintenance therapy   Osteoporosis 12/18/05   Increased  risk    PONV (postoperative nausea and vomiting)    Wears  glasses    Past Surgical History:  Procedure Laterality Date   ABDOMINAL HYSTERECTOMY  10/31/2011   Procedure: HYSTERECTOMY ABDOMINAL;  Surgeon: Laurette Schimke, MD PHD;  Location: [facility];  Service: Gynecology;  Laterality: N/A;   CO2 LASER APPLICATION N/A 02/22/2016   Procedure: CO2 LASER APPLICATION;  Surgeon: Laurette Schimke, MD;  Location: [facility];  Service: Gynecology;  Laterality: N/A;   CYSTOSCOPY WITH RETROGRADE PYELOGRAM, URETEROSCOPY AND STENT PLACEMENT Left 02/04/2018   Procedure: CYSTOSCOPY WITH RETROGRADE PYELOGRAM, URETEROSCOPY , bladder biopsy and fulgeration;  Surgeon: Malen Gauze, MD;  Location: [facility];  Service: Urology;  Laterality: Left;   ECTOPIC PREGNANCY SURGERY  1992   HYSTEROSCOPY WITH D & C  09/27/2011   Procedure: DILATATION AND CURETTAGE /HYSTEROSCOPY;  Surgeon: Hal Morales, MD;  Location: [facility];  Service: Gynecology;;   IR FLUORO GUIDE PORT INSERTION RIGHT  09/08/2016   IR US GUIDE VASC ACCESS RIGHT  09/08/2016   LAPAROTOMY  10/31/2011   Procedure: EXPLORATORY LAPAROTOMY;  Surgeon: Laurette Schimke, MD PHD;  Location: [facility];  Service: Gynecology;  Laterality: N/A;   SALPINGOOPHORECTOMY  10/31/2011   Procedure: SALPINGO OOPHERECTOMY;  Surgeon: Laurette Schimke, MD PHD;  Location: [facility];  Service: Gynecology;  Laterality: Bilateral;   Lymph Nodes sampling   TUBAL LIGATION  1986     Current Meds  Medication Sig   amoxicillin-clavulanate (AUGMENTIN) 875-125 MG tablet Take 1 tablet by mouth 2 (two) times daily for 7 days.   benzonatate (TESSALON PERLES) 100 MG capsule Take 1 capsule (100 mg total) by mouth 3 (three) times daily as needed for cough.     Allergies:   Codeine   Social History   Tobacco Use   Smoking status: Never   Smokeless tobacco: Never  Vaping Use   Vaping Use: Never used  Substance Use Topics   Alcohol use: Not Currently    Alcohol/week: 0.0 standard drinks   Drug use: Never     Family  Hx: The patient's family history includes Colon cancer in her mother; Heart Problems in her father; Hypertension in her father.  ROS:   Please see the history of present illness.    Review of Systems  Constitutional:  Positive for fever. Negative for chills and malaise/fatigue.  HENT:  Positive for congestion. Negative for ear pain, sinus pain and sore throat.   Respiratory:  Positive for cough. Negative for shortness of breath and wheezing.   Gastrointestinal:  Negative for diarrhea, nausea and vomiting.  Musculoskeletal:  Negative for myalgias.  Neurological:  Negative for dizziness, weakness and headaches.   All other systems reviewed and are negative.   Labs/Other Tests and Data Reviewed:    Recent Labs: 03/31/2020: TSH 1.770 06/30/2020: ALT 17; BUN 18; Creatinine 0.91; Hemoglobin 9.0; Platelet Count 118; Potassium 3.8; Sodium 141   Recent Lipid Panel Lab Results  Component Value Date/Time   CHOL 261 (H) 03/22/2007 09:23 AM   TRIG 199 (H) 03/22/2007 09:23 AM  HDL 57 03/22/2007 09:23 AM   CHOLHDL 4.6 03/22/2007 09:23 AM   LDLCALC 164 (H) 03/22/2007 09:23 AM    Wt Readings from Last 3 Encounters:  06/30/20 175 lb (79.4 kg)  06/10/20 176 lb (79.8 kg)  05/20/20 177 lb (80.3 kg)     Exam:    Vital Signs:  BP (!) 147/74   Pulse (!) 107   Temp 99.2 F (37.3 C) (Oral)     Physical Exam Vitals and nursing note reviewed.  Neurological:     Mental Status: She is alert and oriented to person, place, and time.  Psychiatric:        Mood and Affect: Affect normal.    ASSESSMENT & PLAN:    Sinus Congestion.  -Due to age and comorbidities will go ahead treat with antx for sinus congestion.  -POC COVID was neg.  -Noval coronavirus, NAA sent to [facility]   Rhinorrhea  - Patient has had runny nose for a couple days.  -Slight congestion. -Will come in for COVID test  -has been taking OTC allergy medication along with coricidin.  -Patient did have fever on and off for couple  of days.  -Continue taking Corcidin as needed along with OTC pain relievers  -Advised patient to take antihistamines as needed.    2. Cough -Patient has cough at night.  -Tessalon pearls sent to pharmacy. -Delsym as needed for cough  - POC COVID-19- neg.  -Will send COVID test to lab.     COVID-19 Education: The signs and symptoms of COVID-19 were discussed with the patient and how to seek care for testing (follow up with PCP or arrange E-visit).  The importance of social distancing was discussed today.  Patient Risk:   After full review of this patients clinical status, I feel that they are at least moderate risk at this time.  Time:   Today, I have spent 15 minutes/ seconds with the patient with telehealth technology discussing above diagnoses.     Medication Adjustments/Labs and Tests Ordered: Current medicines are reviewed at length with the patient today.  Concerns regarding medicines are outlined above.   Tests Ordered: Orders Placed This Encounter  Procedures   Novel Coronavirus, NAA ([facility])   POC COVID-19    Medication Changes: Meds ordered this encounter  Medications   benzonatate (TESSALON PERLES) 100 MG capsule    Sig: Take 1 capsule (100 mg total) by mouth 3 (three) times daily as needed for cough.    Dispense:  30 capsule    Refill:  0   amoxicillin-clavulanate (AUGMENTIN) 875-125 MG tablet    Sig: Take 1 tablet by mouth 2 (two) times daily for 7 days.    Dispense:  14 tablet    Refill:  0    The patient was encouraged to call or send a message through MyChart for any questions or concerns.   Follow up: if symptoms persist or do not get better.   Patient was given opportunity to ask questions. Patient verbalized understanding of the plan and was able to repeat key elements of the plan. All questions were answered to their satisfaction.  Raman Senai Ramnath, DNP   I, Raman Philander Ake have reviewed all documentation for this visit. The documentation on  07/13/20  for the exam, diagnosis, procedures, and orders are all accurate and complete.    Signed, Charlesetta Ivory, NP

## 2020-07-13 NOTE — Patient Instructions (Signed)
Cough, Adult °A cough helps to clear your throat and lungs. A cough may be a sign of an illness or another medical condition. °An acute cough may only last 2-3 weeks, while a chronic cough may last 8 or more weeks. °Many things can cause a cough. They include: °Germs (viruses or bacteria) that attack the airway. °Breathing in things that bother (irritate) your lungs. °Allergies. °Asthma. °Mucus that runs down the back of your throat (postnasal drip). °Smoking. °Acid backing up from the stomach into the tube that moves food from the mouth to the stomach (gastroesophageal reflux). °Some medicines. °Lung problems. °Other medical conditions, such as heart failure or a blood clot in the lung (pulmonary embolism). °Follow these instructions at home: °Medicines °Take over-the-counter and prescription medicines only as told by your doctor. °Talk with your doctor before you take medicines that stop a cough (cough suppressants). °Lifestyle ° °Do not smoke, and try not to be around smoke. Do not use any products that contain nicotine or tobacco, such as cigarettes, e-cigarettes, and chewing tobacco. If you need help quitting, ask your doctor. °Drink enough fluid to keep your pee (urine) pale yellow. °Avoid caffeine. °Do not drink alcohol if your doctor tells you not to drink. °General instructions ° °Watch for any changes in your cough. Tell your doctor about them. °Always cover your mouth when you cough. °Stay away from things that make you cough, such as perfume, candles, campfire smoke, or cleaning products. °If the air is dry, use a cool mist vaporizer or humidifier in your home. °If your cough is worse at night, try using extra pillows to raise your head up higher while you sleep. °Rest as needed. °Keep all follow-up visits as told by your doctor. This is important. °Contact a doctor if: °You have new symptoms. °You cough up pus. °Your cough does not get better after 2-3 weeks, or your cough gets worse. °Cough medicine  does not help your cough and you are not sleeping well. °You have pain that gets worse or pain that is not helped with medicine. °You have a fever. °You are losing weight and you do not know why. °You have night sweats. °Get help right away if: °You cough up blood. °You have trouble breathing. °Your heartbeat is very fast. °These symptoms may be an emergency. Do not wait to see if the symptoms will go away. Get medical help right away. Call your local emergency services (911 in the [place] Do not drive yourself to the hospital. °Summary °A cough helps to clear your throat and lungs. Many things can cause a cough. °Take over-the-counter and prescription medicines only as told by your doctor. °Always cover your mouth when you cough. °Contact a doctor if you have new symptoms or you have a cough that does not get better or gets worse. °This information is not intended to replace advice given to you by your health care provider. Make sure you discuss any questions you have with your health care provider. °Document Revised: 03/07/2019 Document Reviewed: 02/04/2018 °Elsevier Patient Education © 2022 [employer] ° °

## 2020-07-14 LAB — SARS-COV-2, NAA 2 DAY TAT

## 2020-07-14 LAB — NOVEL CORONAVIRUS, NAA: SARS-CoV-2, NAA: NOT DETECTED

## 2020-07-21 ENCOUNTER — Other Ambulatory Visit: Payer: Self-pay

## 2020-07-21 ENCOUNTER — Telehealth: Payer: Self-pay

## 2020-07-21 DIAGNOSIS — H25813 Combined forms of age-related cataract, bilateral: Secondary | ICD-10-CM | POA: Diagnosis not present

## 2020-07-21 DIAGNOSIS — H04123 Dry eye syndrome of bilateral lacrimal glands: Secondary | ICD-10-CM | POA: Diagnosis not present

## 2020-07-21 DIAGNOSIS — H16223 Keratoconjunctivitis sicca, not specified as Sjogren's, bilateral: Secondary | ICD-10-CM | POA: Diagnosis not present

## 2020-07-21 DIAGNOSIS — Z79899 Other long term (current) drug therapy: Secondary | ICD-10-CM | POA: Diagnosis not present

## 2020-07-21 DIAGNOSIS — H40033 Anatomical narrow angle, bilateral: Secondary | ICD-10-CM | POA: Diagnosis not present

## 2020-07-21 DIAGNOSIS — H16143 Punctate keratitis, bilateral: Secondary | ICD-10-CM | POA: Diagnosis not present

## 2020-07-21 MED ORDER — CETIRIZINE HCL 10 MG PO TABS: 10.0000 mg | ORAL_TABLET | Freq: Every day | ORAL | 1 refills | Status: DC

## 2020-07-21 NOTE — Telephone Encounter (Signed)
The pt called and left a message that she still isn't feeling well, that she has been feeling bad for 2 wks with covid, I called the pt to get her symptoms and to let her know that her covid results was neg.

## 2020-07-22 ENCOUNTER — Other Ambulatory Visit: Payer: Self-pay

## 2020-07-22 ENCOUNTER — Inpatient Hospital Stay: Payer: Medicare Other

## 2020-07-22 ENCOUNTER — Inpatient Hospital Stay (HOSPITAL_BASED_OUTPATIENT_CLINIC_OR_DEPARTMENT_OTHER): Payer: Medicare Other | Admitting: Hematology & Oncology

## 2020-07-22 ENCOUNTER — Telehealth: Payer: Self-pay | Admitting: *Deleted

## 2020-07-22 ENCOUNTER — Encounter: Payer: Self-pay | Admitting: Hematology & Oncology

## 2020-07-22 ENCOUNTER — Encounter: Payer: Self-pay | Admitting: *Deleted

## 2020-07-22 VITALS — BP 140/68 | HR 98 | Temp 98.7°F | Resp 16

## 2020-07-22 VITALS — Ht 62.0 in | Wt 174.0 lb

## 2020-07-22 DIAGNOSIS — Z8542 Personal history of malignant neoplasm of other parts of uterus: Secondary | ICD-10-CM | POA: Diagnosis not present

## 2020-07-22 DIAGNOSIS — C9000 Multiple myeloma not having achieved remission: Secondary | ICD-10-CM | POA: Diagnosis not present

## 2020-07-22 DIAGNOSIS — Z5112 Encounter for antineoplastic immunotherapy: Secondary | ICD-10-CM | POA: Diagnosis not present

## 2020-07-22 DIAGNOSIS — Z79899 Other long term (current) drug therapy: Secondary | ICD-10-CM | POA: Diagnosis not present

## 2020-07-22 DIAGNOSIS — D509 Iron deficiency anemia, unspecified: Secondary | ICD-10-CM | POA: Diagnosis not present

## 2020-07-22 DIAGNOSIS — Z95828 Presence of other vascular implants and grafts: Secondary | ICD-10-CM

## 2020-07-22 DIAGNOSIS — Z79811 Long term (current) use of aromatase inhibitors: Secondary | ICD-10-CM | POA: Diagnosis not present

## 2020-07-22 LAB — CBC WITH DIFFERENTIAL (CANCER CENTER ONLY)
Abs Immature Granulocytes: 0.02 10*3/uL (ref 0.00–0.07)
Basophils Absolute: 0 10*3/uL (ref 0.0–0.1)
Basophils Relative: 1 %
Eosinophils Absolute: 0.1 10*3/uL (ref 0.0–0.5)
Eosinophils Relative: 2 %
HCT: 26.7 % — ABNORMAL LOW (ref 36.0–46.0)
Hemoglobin: 8.4 g/dL — ABNORMAL LOW (ref 12.0–15.0)
Immature Granulocytes: 1 %
Lymphocytes Relative: 19 %
Lymphs Abs: 0.7 10*3/uL (ref 0.7–4.0)
MCH: 26.2 pg (ref 26.0–34.0)
MCHC: 31.5 g/dL (ref 30.0–36.0)
MCV: 83.2 fL (ref 80.0–100.0)
Monocytes Absolute: 0.5 10*3/uL (ref 0.1–1.0)
Monocytes Relative: 13 %
Neutro Abs: 2.4 10*3/uL (ref 1.7–7.7)
Neutrophils Relative %: 64 %
Platelet Count: 118 10*3/uL — ABNORMAL LOW (ref 150–400)
RBC: 3.21 MIL/uL — ABNORMAL LOW (ref 3.87–5.11)
RDW: 18 % — ABNORMAL HIGH (ref 11.5–15.5)
WBC Count: 3.7 10*3/uL — ABNORMAL LOW (ref 4.0–10.5)
nRBC: 0 % (ref 0.0–0.2)

## 2020-07-22 LAB — CMP (CANCER CENTER ONLY)
ALT: 17 U/L (ref 0–44)
AST: 19 U/L (ref 15–41)
Albumin: 3.8 g/dL (ref 3.5–5.0)
Alkaline Phosphatase: 55 U/L (ref 38–126)
Anion gap: 8 (ref 5–15)
BUN: 11 mg/dL (ref 8–23)
CO2: 30 mmol/L (ref 22–32)
Calcium: 9.1 mg/dL (ref 8.9–10.3)
Chloride: 103 mmol/L (ref 98–111)
Creatinine: 0.79 mg/dL (ref 0.44–1.00)
GFR, Estimated: >60 mL/min (ref >60)
Glucose, Bld: 94 mg/dL (ref 70–99)
Potassium: 2.7 mmol/L — CL (ref 3.5–5.1)
Sodium: 141 mmol/L (ref 135–145)
Total Bilirubin: 0.2 mg/dL — ABNORMAL LOW (ref 0.3–1.2)
Total Protein: 6.3 g/dL — ABNORMAL LOW (ref 6.5–8.1)

## 2020-07-22 LAB — LACTATE DEHYDROGENASE: LDH: 300 U/L — ABNORMAL HIGH (ref 98–192)

## 2020-07-22 MED ORDER — SODIUM CHLORIDE 0.9% FLUSH
10.0000 mL | Freq: Once | INTRAVENOUS | Status: AC
Start: 2020-07-22 — End: 2020-07-22
  Administered 2020-07-22: 10 mL via INTRAVENOUS
  Filled 2020-07-22: qty 10

## 2020-07-22 MED ORDER — HEPARIN SOD (PORK) LOCK FLUSH 100 UNIT/ML IV SOLN
500.0000 [IU] | Freq: Once | INTRAVENOUS | Status: AC
  Administered 2020-07-22: 500 [IU] via INTRAVENOUS
  Filled 2020-07-22: qty 5

## 2020-07-22 NOTE — Progress Notes (Signed)
Eye exam received via fax from Dr. Marigene Ehlers, 07/21/20 stating "hold next tx due to corneal changes."  Dr Marin Olp notified.

## 2020-07-22 NOTE — Patient Instructions (Signed)
Implanted Port Home Guide An implanted port is a device that is placed under the skin. It is usually placed in the chest. The device can be used to give IV medicine, to take blood, or for dialysis. You may have an implanted port if: You need IV medicine that would be irritating to the small veins in your hands or arms. You need IV medicines, such as antibiotics, for a long period of time. You need IV nutrition for a long period of time. You need dialysis. When you have a port, your health care provider can choose to use the port instead of veins in your arms for these procedures. You may have fewer limitations when using a port than you would if you used other types of long-term IVs, and you will likely be able to return to normal activities afteryour incision heals. An implanted port has two main parts: Reservoir. The reservoir is the part where a needle is inserted to give medicines or draw blood. The reservoir is round. After it is placed, it appears as a small, raised area under your skin. Catheter. The catheter is a thin, flexible tube that connects the reservoir to a vein. Medicine that is inserted into the reservoir goes into the catheter and then into the vein. How is my port accessed? To access your port: A numbing cream may be placed on the skin over the port site. Your health care provider will put on a mask and sterile gloves. The skin over your port will be cleaned carefully with a germ-killing soap and allowed to dry. Your health care provider will gently pinch the port and insert a needle into it. Your health care provider will check for a blood return to make sure the port is in the vein and is not clogged. If your port needs to remain accessed to get medicine continuously (constant infusion), your health care provider will place a clear bandage (dressing) over the needle site. The dressing and needle will need to be changed every week, or as told by your health care provider. What  is flushing? Flushing helps keep the port from getting clogged. Follow instructions from your health care provider about how and when to flush the port. Ports are usually flushed with saline solution or a medicine called heparin. The need for flushing will depend on how the port is used: If the port is only used from time to time to give medicines or draw blood, the port may need to be flushed: Before and after medicines have been given. Before and after blood has been drawn. As part of routine maintenance. Flushing may be recommended every 4-6 weeks. If a constant infusion is running, the port may not need to be flushed. Throw away any syringes in a disposal container that is meant for sharp items (sharps container). You can buy a sharps container from a pharmacy, or you can make one by using an empty hard plastic bottle with a cover. How long will my port stay implanted? The port can stay in for as long as your health care provider thinks it is needed. When it is time for the port to come out, a surgery will be done to remove it. The surgery will be similar to the procedure that was done to putthe port in. Follow these instructions at home:  Flush your port as told by your health care provider. If you need an infusion over several days, follow instructions from your health care provider about how to take   care of your port site. Make sure you: Wash your hands with soap and water before you change your dressing. If soap and water are not available, use alcohol-based hand sanitizer. Change your dressing as told by your health care provider. Place any used dressings or infusion bags into a plastic bag. Throw that bag in the trash. Keep the dressing that covers the needle clean and dry. Do not get it wet. Do not use scissors or sharp objects near the tube. Keep the tube clamped, unless it is being used. Check your port site every day for signs of infection. Check for: Redness, swelling, or  pain. Fluid or blood. Pus or a bad smell. Protect the skin around the port site. Avoid wearing bra straps that rub or irritate the site. Protect the skin around your port from seat belts. Place a soft pad over your chest if needed. Bathe or shower as told by your health care provider. The site may get wet as long as you are not actively receiving an infusion. Return to your normal activities as told by your health care provider. Ask your health care provider what activities are safe for you. Carry a medical alert card or wear a medical alert bracelet at all times. This will let health care providers know that you have an implanted port in case of an emergency. Get help right away if: You have redness, swelling, or pain at the port site. You have fluid or blood coming from your port site. You have pus or a bad smell coming from the port site. You have a fever. Summary Implanted ports are usually placed in the chest for long-term IV access. Follow instructions from your health care provider about flushing the port and changing bandages (dressings). Take care of the area around your port by avoiding clothing that puts pressure on the area, and by watching for signs of infection. Protect the skin around your port from seat belts. Place a soft pad over your chest if needed. Get help right away if you have a fever or you have redness, swelling, pain, drainage, or a bad smell at the port site. This information is not intended to replace advice given to you by your health care provider. Make sure you discuss any questions you have with your healthcare provider. Document Revised: 06/02/2019 Document Reviewed: 06/02/2019 Elsevier Patient Education  2022 [employer]  

## 2020-07-22 NOTE — Progress Notes (Signed)
Hematology and Oncology Follow Up Visit  Emily Greer [id number] [birth year] 70 y.o. 07/22/2020   Principle Diagnosis:  Recurrent lambda light chain myeloma - nl cytogenetics History of recurrent endometrial carcinoma Iron deficiency anemia -blood loss   Past Therapy:             Status post second autologous stem cell transplant on 07/24/2014 Maintenance therapy with Pomalidomide/every 2 week Velcade - d/c'ed Radiation therapy for endometrial recurrence - completed 04/20/2015 Pomalyst/Kyprolis 70mg /m2 IV q 2 weeks - s/p cycle #12 - held on 12/26/2017 for hematuria Daratumumab/Pomalyst (1 mg) - status post cycle 19 -- d/c on 08/21/2019   Current Therapy:        Melflufen 40 mg IV q 4 weeks -- started on 08/27/2019, s/p cycle #2 --  D/c due to FDA removal Selinexor 60 mg po q week -- start on 01/19/2020 -- changed on 03/18/2020 -- d/c on 04/27/2020 Blenrep 2.5 mg/m2 IV q 3 week -- s/p cycle #2 - start on 05/20/2020 Femara 2.5 mg po q day  Xgeva 120 mg subcu every 3 months - next dose in 07/2020 IV iron as indicated   Interim History:  Ms. [name] is here today for follow-up.  She is responding very well to the Blenrep.  Her lambda light chain has been coming down so nicely.  Back when he started treatment the lambda light chain was 36.9 mg/dL.  When we last checked in early June he was down to 9.5 mg/dL.  Unfortunate, she started to have some ocular issues.  She sees the ophthalmologist as per the protocol.  She does have a little bit of keratitis.  As such, we are going to have to hold on her treatment today.  She will go back to see the ophthalmologist next week.  Patient also has cataract issues.  She is going need to have cataract surgery.  Otherwise she is doing pretty well.  She is quite tired because she had her 2 grandsons for a little bit.  This wore her down.  However, she was happy to have them.  She is eating well.  She have no problems with bleeding.  There is  there is no nausea or vomiting.  She is on Femara for the uterine carcinoma.  She is done well with this.  She has had no cough or shortness of breath.  Overall, performance status is ECOG 1.     Medications:  Allergies as of 07/22/2020       Reactions   Codeine Nausea Only        Medication List        Accurate as of July 22, 2020  5:49 PM. If you have any questions, ask your nurse or doctor.          acyclovir 400 MG tablet Commonly known as: ZOVIRAX TAKE 1 TABLET(400 MG) BY MOUTH TWICE DAILY   albuterol 108 (90 Base) MCG/ACT inhaler Commonly known as: VENTOLIN HFA Inhale 2 puffs into the lungs every 6 (six) hours as needed for wheezing or shortness of breath. 2 puffs 3 times daily x 5 days then every 6 hours as needed.   amLODipine 10 MG tablet Commonly known as: NORVASC TAKE 1 TABLET(10 MG) BY MOUTH EVERY MORNING   amoxicillin-clavulanate 875-125 MG tablet Commonly known as: AUGMENTIN   aspirin EC 81 MG tablet Take 81 mg by mouth daily.   benzonatate 100 MG capsule Commonly known as: Tessalon Perles Take 1 capsule (100 mg total) by mouth 3 (  three) times daily as needed for cough.   CALCIUM 1200+D3 PO Take 1 tablet by mouth daily.   Carboxymethylcell-Glycerin PF 0.5-0.9 % Soln Place 2 drops into both eyes 4 (four) times daily.   cetirizine 10 MG tablet Commonly known as: ZYRTEC Take 1 tablet (10 mg total) by mouth at bedtime.   Darzalex 100 MG/5ML Generic drug: daratumumab   Fluocinolone Acetonide Scalp 0.01 % Oil Apply topically.   hydrochlorothiazide 12.5 MG capsule Commonly known as: MICROZIDE Take 12.5 mg by mouth every morning.   letrozole 2.5 MG tablet Commonly known as: FEMARA   letrozole 2.5 MG tablet Commonly known as: FEMARA Take 1 tablet (2.5 mg total) by mouth daily.   lidocaine-prilocaine cream Commonly known as: EMLA Apply 1 application topically as needed.   loperamide 2 MG capsule Commonly known as: IMODIUM Take by  mouth as needed for diarrhea or loose stools. Reported on 08/19/2015   loratadine 10 MG tablet Commonly known as: CLARITIN Take 10 mg by mouth every morning. Reported on 08/19/2015   metoprolol succinate 50 MG 24 hr tablet Commonly known as: TOPROL-XL TAKE 1 TABLET(50 MG) BY MOUTH EVERY MORNING   Minoxidil for Men 5 % Soln Generic drug: MINOXIDIL (TOPICAL) SMARTSIG:Sparingly Topical Every Night   Rogaine Mens 5 % Foam Generic drug: Minoxidil See admin instructions.   montelukast 10 MG tablet Commonly known as: SINGULAIR TAKE 1 TABLET(10 MG) BY MOUTH AT BEDTIME   ondansetron 8 MG tablet Commonly known as: Zofran Take 1 tablet (8 mg total) by mouth 2 (two) times daily as needed (Nausea or vomiting).   penicillin v potassium 500 MG tablet Commonly known as: VEETID   potassium chloride SA 20 MEQ tablet Commonly known as: KLOR-CON TAKE 1 TABLET(20 MEQ) BY MOUTH TWICE DAILY   PROBIOTIC DAILY PO Take by mouth daily.   triamcinolone 55 MCG/ACT Aero nasal inhaler Commonly known as: NASACORT Place 2 sprays into the nose as needed.   triamterene-hydrochlorothiazide 37.5-25 MG tablet Commonly known as: MAXZIDE-25 1 tablet by mouth at bedtime   Vitamin D3 50 MCG (2000 UT) Tabs Take 2 tablets by mouth daily.   Zinc 50 MG Tabs 1 tablet   zinc gluconate 50 MG tablet Take 50 mg by mouth daily.        Allergies:  Allergies  Allergen Reactions   Codeine Nausea Only    Past Medical History, Surgical history, Social history, and Family History were reviewed and updated.  Review of Systems: Review of Systems  Constitutional: Negative.   HENT: Negative.    Eyes: Negative.   Respiratory: Negative.    Cardiovascular: Negative.   Gastrointestinal: Negative.   Genitourinary: Negative.   Musculoskeletal: Negative.   Skin: Negative.   Neurological: Negative.   Endo/Heme/Allergies: Negative.   Psychiatric/Behavioral: Negative.       Physical Exam:  height is 5\' 2"   (1.575 m) and weight is 174 lb (78.9 kg).   Wt Readings from Last 3 Encounters:  07/22/20 174 lb (78.9 kg)  06/30/20 175 lb (79.4 kg)  06/10/20 176 lb (79.8 kg)    Physical Exam Vitals reviewed.  HENT:     Head: Normocephalic and atraumatic.  Eyes:     Pupils: Pupils are equal, round, and reactive to light.  Cardiovascular:     Rate and Rhythm: Normal rate and regular rhythm.     Heart sounds: Normal heart sounds.  Pulmonary:     Effort: Pulmonary effort is normal.     Breath sounds: Normal breath sounds.  Abdominal:     General: Bowel sounds are normal.     Palpations: Abdomen is soft.  Musculoskeletal:        General: No tenderness or deformity. Normal range of motion.     Cervical back: Normal range of motion.  Lymphadenopathy:     Cervical: No cervical adenopathy.  Skin:    General: Skin is warm and dry.     Findings: No erythema or rash.  Neurological:     Mental Status: She is alert and oriented to person, place, and time.  Psychiatric:        Behavior: Behavior normal.        Thought Content: Thought content normal.        Judgment: Judgment normal.     Lab Results  Component Value Date   WBC 3.7 (L) 07/22/2020   HGB 8.4 (L) 07/22/2020   HCT 26.7 (L) 07/22/2020   MCV 83.2 07/22/2020   PLT 118 (L) 07/22/2020   Lab Results  Component Value Date   FERRITIN 2,472 (H) 04/27/2020   IRON 98 04/27/2020   TIBC 361 04/27/2020   UIBC 263 04/27/2020   IRONPCTSAT 27 04/27/2020   Lab Results  Component Value Date   RETICCTPCT 1.6 11/05/2019   RBC 3.21 (L) 07/22/2020   Lab Results  Component Value Date   KPAFRELGTCHN 3.4 06/30/2020   LAMBDASER 95.3 (H) 06/30/2020   KAPLAMBRATIO 0.04 (L) 06/30/2020   Lab Results  Component Value Date   IGGSERUM 473 (L) 06/30/2020   IGGSERUM 485 (L) 06/30/2020   IGA 14 (L) 06/30/2020   IGA 14 (L) 06/30/2020   IGMSERUM <5 (L) 06/30/2020   IGMSERUM <5 (L) 06/30/2020   Lab Results  Component Value Date   TOTALPROTELP  6.5 06/30/2020   ALBUMINELP 3.5 06/30/2020   A1GS 0.3 06/30/2020   A2GS 1.1 (H) 06/30/2020   BETS 1.2 06/30/2020   BETA2SER 0.4 11/23/2014   GAMS 0.4 06/30/2020   MSPIKE 0.4 (H) 06/30/2020   SPEI Comment 05/20/2020     Chemistry      Component Value Date/Time   NA 141 07/22/2020 0945   NA 141 01/10/2017 1115   NA 140 06/21/2016 0918   K 2.7 (LL) 07/22/2020 0945   K 4.0 01/10/2017 1115   K 4.3 06/21/2016 0918   CL 103 07/22/2020 0945   CL 106 01/10/2017 1115   CO2 30 07/22/2020 0945   CO2 27 01/10/2017 1115   CO2 20 (L) 06/21/2016 0918   BUN 11 07/22/2020 0945   BUN 15 01/10/2017 1115   BUN 15.8 06/21/2016 0918   CREATININE 0.79 07/22/2020 0945   CREATININE 1.0 01/10/2017 1115   CREATININE 0.8 06/21/2016 0918      Component Value Date/Time   CALCIUM 9.1 07/22/2020 0945   CALCIUM 9.5 01/10/2017 1115   CALCIUM 9.4 06/21/2016 0918   ALKPHOS 55 07/22/2020 0945   ALKPHOS 40 01/10/2017 1115   ALKPHOS 66 06/21/2016 0918   AST 19 07/22/2020 0945   AST 17 06/21/2016 0918   ALT 17 07/22/2020 0945   ALT 19 01/10/2017 1115   ALT 37 06/21/2016 0918   BILITOT 0.2 (L) 07/22/2020 0945   BILITOT 0.32 06/21/2016 0918       Impression and Plan: Ms. Mccaffery is a very pleasant 70 yo African American female with recurrent lambda light chain myeloma. Her second stem cell transplant for light chain myeloma was in June 2016.   She also has localized recurrent endometrial cancer (followed by gyn  onc Dr. Nelly Rout) and is currently on Femara.   Is nice to see that her light chains are coming down.  Hopefully we will continue to see a response.  We will have to hold her treatment today.  We are going to try to get her next week to see if we can get her treated.  Obviously we have to make any dose adjustments with the Blenrep.  Again I am glad that her quality of life is doing well.          Josph Macho, MD 6/23/20225:49 PM

## 2020-07-22 NOTE — Telephone Encounter (Signed)
Dr. Marin Olp notified of potassium-2.7. Pt notified to double her dose of potassium today to 40 meq BID per order of Dr. Marin Olp.

## 2020-07-23 ENCOUNTER — Telehealth: Payer: Self-pay | Admitting: *Deleted

## 2020-07-23 LAB — IGG, IGA, IGM
IgA: 10 mg/dL — ABNORMAL LOW (ref 87–352)
IgG (Immunoglobin G), Serum: 392 mg/dL — ABNORMAL LOW (ref 586–1602)
IgM (Immunoglobulin M), Srm: <5 mg/dL — ABNORMAL LOW (ref 26–217)

## 2020-07-23 LAB — KAPPA/LAMBDA LIGHT CHAINS
Kappa free light chain: 1.5 mg/L — ABNORMAL LOW (ref 3.3–19.4)
Kappa, lambda light chain ratio: 0.02 — ABNORMAL LOW (ref 0.26–1.65)
Lambda free light chains: 65.5 mg/L — ABNORMAL HIGH (ref 5.7–26.3)

## 2020-07-23 NOTE — Telephone Encounter (Signed)
Call placed to patient to notify her that Dr. Marin Olp and Dr. Marigene Ehlers feel that it is best for her to wait three weeks before the next attempt of eye exam and Blenrep.  Informed pt that scheduling would be calling her to reschedule appts for 07/29/20 to 08/12/20 and that eye appt should be scheduled the week of 08/09/20.  Pt appreciative of call and has no questions at this time.

## 2020-07-23 NOTE — Telephone Encounter (Signed)
Per 07/22/20 los - called and gave patient's husband upcoming appointments and confirmed

## 2020-07-23 NOTE — Telephone Encounter (Signed)
Per staff message from Webb Silversmith 07/22/20 and Lorriane Shire 07/23/20- Called patient to reschedule appointment 3 weeks out per Dr. Marin Olp - patient confirmed.

## 2020-07-27 LAB — PROTEIN ELECTROPHORESIS, SERUM, WITH REFLEX
A/G Ratio: 1.2 (ref 0.7–1.7)
Albumin ELP: 3.2 g/dL (ref 2.9–4.4)
Alpha-1-Globulin: 0.3 g/dL (ref 0.0–0.4)
Alpha-2-Globulin: 1.1 g/dL — ABNORMAL HIGH (ref 0.4–1.0)
Beta Globulin: 1 g/dL (ref 0.7–1.3)
Gamma Globulin: 0.3 g/dL — ABNORMAL LOW (ref 0.4–1.8)
Globulin, Total: 2.7 g/dL (ref 2.2–3.9)
SPEP Interpretation: 0
Total Protein ELP: 5.9 g/dL — ABNORMAL LOW (ref 6.0–8.5)

## 2020-07-27 LAB — IMMUNOFIXATION REFLEX, SERUM
IgA: 12 mg/dL — ABNORMAL LOW (ref 87–352)
IgG (Immunoglobin G), Serum: 447 mg/dL — ABNORMAL LOW (ref 586–1602)
IgM (Immunoglobulin M), Srm: <5 mg/dL — ABNORMAL LOW (ref 26–217)

## 2020-07-29 ENCOUNTER — Other Ambulatory Visit: Payer: BLUE CROSS/BLUE SHIELD

## 2020-07-29 ENCOUNTER — Ambulatory Visit: Payer: BLUE CROSS/BLUE SHIELD | Admitting: Hematology & Oncology

## 2020-07-29 ENCOUNTER — Ambulatory Visit: Payer: BLUE CROSS/BLUE SHIELD

## 2020-07-30 ENCOUNTER — Telehealth: Payer: Self-pay | Admitting: *Deleted

## 2020-07-30 NOTE — Telephone Encounter (Signed)
Patient called and left a voice mail message stating,"my appointment for my dry eyes with Dr. Jerline Pain is next Friday, July 8th, at 12:30pm."

## 2020-08-02 ENCOUNTER — Other Ambulatory Visit: Payer: Self-pay | Admitting: Hematology & Oncology

## 2020-08-02 DIAGNOSIS — E876 Hypokalemia: Secondary | ICD-10-CM

## 2020-08-03 ENCOUNTER — Encounter: Payer: Self-pay | Admitting: *Deleted

## 2020-08-03 ENCOUNTER — Encounter: Payer: Self-pay | Admitting: Hematology & Oncology

## 2020-08-06 DIAGNOSIS — H25813 Combined forms of age-related cataract, bilateral: Secondary | ICD-10-CM | POA: Diagnosis not present

## 2020-08-06 DIAGNOSIS — H04123 Dry eye syndrome of bilateral lacrimal glands: Secondary | ICD-10-CM | POA: Diagnosis not present

## 2020-08-06 DIAGNOSIS — H35033 Hypertensive retinopathy, bilateral: Secondary | ICD-10-CM | POA: Diagnosis not present

## 2020-08-06 DIAGNOSIS — H16143 Punctate keratitis, bilateral: Secondary | ICD-10-CM | POA: Diagnosis not present

## 2020-08-10 ENCOUNTER — Other Ambulatory Visit: Payer: Self-pay

## 2020-08-10 ENCOUNTER — Encounter: Payer: Self-pay | Admitting: Internal Medicine

## 2020-08-10 ENCOUNTER — Ambulatory Visit (INDEPENDENT_AMBULATORY_CARE_PROVIDER_SITE_OTHER): Payer: Medicare Other | Admitting: Internal Medicine

## 2020-08-10 ENCOUNTER — Ambulatory Visit: Payer: Medicare Other | Admitting: Internal Medicine

## 2020-08-10 VITALS — BP 128/78 | HR 82 | Temp 98.6°F | Ht 62.0 in | Wt 173.8 lb

## 2020-08-10 DIAGNOSIS — Z1159 Encounter for screening for other viral diseases: Secondary | ICD-10-CM | POA: Diagnosis not present

## 2020-08-10 DIAGNOSIS — L659 Nonscarring hair loss, unspecified: Secondary | ICD-10-CM

## 2020-08-10 DIAGNOSIS — R0982 Postnasal drip: Secondary | ICD-10-CM

## 2020-08-10 DIAGNOSIS — I10 Essential (primary) hypertension: Secondary | ICD-10-CM | POA: Diagnosis not present

## 2020-08-10 NOTE — Patient Instructions (Signed)

## 2020-08-10 NOTE — Progress Notes (Signed)
Pura Spice as a Neurosurgeon for Gwynneth Aliment, MD.,have documented all relevant documentation on the behalf of Gwynneth Aliment, MD,as directed by  Gwynneth Aliment, MD while in the presence of Gwynneth Aliment, MD.  This visit occurred during the SARS-CoV-2 public health emergency.  Safety protocols were in place, including screening questions prior to the visit, additional usage of staff PPE, and extensive cleaning of exam room while observing appropriate contact time as indicated for disinfecting solutions.  Subjective:     Patient ID: Emily Greer , female    DOB: Jun 11, 1950 , 70 y.o.   MRN: [id number]   Chief Complaint  Patient presents with   Hypertension     HPI  She presents today for BP f/u. She denies headaches, chest pain and shortness of breath. She reports compliance with meds .  Hypertension This is a chronic problem. The current episode started more than 1 year ago. The problem has been gradually improving since onset. The problem is controlled. Pertinent negatives include no blurred vision, chest pain, palpitations or shortness of breath. Past treatments include beta blockers and diuretics. The current treatment provides moderate improvement. Compliance problems include exercise.  There is no history of pheochromocytoma or renovascular disease.    Past Medical History:  Diagnosis Date   Avascular necrosis of femoral head (HCC)    bilateral per CT 07-26-2015   Endometrial carcinoma [facility]) gyn oncologist-  dr Nelly Rout ([facility])/  radiation oncologist-- dr Roselind Messier   2013 dx  FIGO Stage 1A, Grade 2 endometrioid endometrial cancer s/p TAH w/ BSO and bilateral pelvic node dissection 10-31-2011 ;  recurrence at distal vagina 04/ 2014 s/p  brachytherapy (ended 07-29-2012);  2nd recurrence 12/ 2016  vaginal apex s/p  conformational radiotherapy 03-10-2015 to 04-20-2015   Family history of adverse reaction to anesthesia    mother ponv   GERD (gastroesophageal  reflux disease)    Goals of care, counseling/discussion 08/21/2019   H/O stem cell transplant (HCC)    02/ 2000 and second one 06/ 2016   History of bacteremia    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   History of chemotherapy    last chemo 12-26-17   History of radiation therapy 6/4, 6/11, 6/19, 6/25, 07/29/2012   vagina 30.5 gray in 5 fx, HDR brachytherapy:   last radiation to vagina 03-10-2015 to 04-20-2015  50.4gray   History of radiation therapy 03/10/15-04/20/15   vagina 50.4 in 28 fractions   Hypertension    Lambda light chain myeloma [facility]) oncologist-  dr Myna Hidalgo ([facility])  and  [facility] -- dr Silvestre Mesi gasparetto   dx 07/ 1999 s/p  VAD chemotherapy 11/ 1999,  purged autotransplant 03-21-1998 followed by auto stem cell transplant 03-29-1998;  recurrance w/ second autologous stem cell transplant 07-24-2014;  in Re-mission currently , chemo maintenance therapy   Osteoporosis 12/18/05   Increased  risk    PONV (postoperative nausea and vomiting)    Wears glasses      Family History  Problem Relation Age of Onset   Colon cancer Mother    Hypertension Father    Heart Problems Father      Current Outpatient Medications:    acyclovir (ZOVIRAX) 400 MG tablet, TAKE 1 TABLET(400 MG) BY MOUTH TWICE DAILY, Disp: 60 tablet, Rfl: 1   albuterol (VENTOLIN HFA) 108 (90 Base) MCG/ACT inhaler, Inhale 2 puffs into the lungs every 6 (six) hours as needed for wheezing or shortness of breath. 2  puffs 3 times daily x 5 days then every 6 hours as needed., Disp: 18 g, Rfl: 11   amLODipine (NORVASC) 10 MG tablet, TAKE 1 TABLET(10 MG) BY MOUTH EVERY MORNING, Disp: 90 tablet, Rfl: 2   aspirin EC 81 MG tablet, Take 81 mg by mouth daily. (Patient not taking: Reported on 09/01/2020), Disp: , Rfl:    Calcium-Magnesium-Vitamin D (CALCIUM 1200+D3 PO), Take 1 tablet by mouth daily. (Patient not taking: Reported on 09/01/2020), Disp: , Rfl:    Carboxymethylcell-Glycerin PF 0.5-0.9 % SOLN, Place  2 drops into both eyes 4 (four) times daily., Disp: 1 each, Rfl: 11   cetirizine (ZYRTEC) 10 MG tablet, Take 1 tablet (10 mg total) by mouth at bedtime., Disp: 90 tablet, Rfl: 1   Cholecalciferol (VITAMIN D3) 2000 UNITS TABS, Take 2 tablets by mouth daily.  (Patient not taking: Reported on 09/01/2020), Disp: , Rfl:    daratumumab (DARZALEX) 100 MG/5ML, , Disp: , Rfl:    Fluocinolone Acetonide Scalp 0.01 % OIL, Apply topically., Disp: , Rfl:    hydrochlorothiazide (MICROZIDE) 12.5 MG capsule, Take 12.5 mg by mouth every morning., Disp: , Rfl:    letrozole (FEMARA) 2.5 MG tablet, Take 1 tablet (2.5 mg total) by mouth daily., Disp: 60 tablet, Rfl: 4   lidocaine-prilocaine (EMLA) cream, Apply 1 application topically as needed., Disp: 30 g, Rfl: 3   loperamide (IMODIUM) 2 MG capsule, Take by mouth as needed for diarrhea or loose stools. Reported on 08/19/2015 (Patient not taking: Reported on 09/01/2020), Disp: , Rfl:    loratadine (CLARITIN) 10 MG tablet, Take 10 mg by mouth every morning. Reported on 08/19/2015, Disp: , Rfl:    metoprolol succinate (TOPROL-XL) 50 MG 24 hr tablet, TAKE 1 TABLET(50 MG) BY MOUTH EVERY MORNING, Disp: 90 tablet, Rfl: 3   Minoxidil (ROGAINE MENS) 5 % FOAM, See admin instructions., Disp: , Rfl:    MINOXIDIL FOR MEN 5 % SOLN, SMARTSIG:Sparingly Topical Every Night, Disp: , Rfl:    montelukast (SINGULAIR) 10 MG tablet, TAKE 1 TABLET(10 MG) BY MOUTH AT BEDTIME, Disp: 90 tablet, Rfl: 2   ondansetron (ZOFRAN) 8 MG tablet, Take 1 tablet (8 mg total) by mouth 2 (two) times daily as needed (Nausea or vomiting)., Disp: 30 tablet, Rfl: 1   potassium chloride SA (KLOR-CON) 20 MEQ tablet, TAKE 1 TABLET(20 MEQ) BY MOUTH TWICE DAILY, Disp: 60 tablet, Rfl: 3   Probiotic Product (PROBIOTIC DAILY PO), Take by mouth daily., Disp: , Rfl:    triamcinolone (NASACORT) 55 MCG/ACT AERO nasal inhaler, Place 2 sprays into the nose as needed. , Disp: , Rfl:    triamterene-hydrochlorothiazide (MAXZIDE-25)  37.5-25 MG tablet, 1 tablet by mouth at bedtime, Disp: 90 tablet, Rfl: 2   Zinc 50 MG TABS, 1 tablet, Disp: , Rfl:    zinc gluconate 50 MG tablet, Take 50 mg by mouth daily., Disp: , Rfl:    XIIDRA 5 % SOLN, , Disp: , Rfl:  No current facility-administered medications for this visit.  Facility-Administered Medications Ordered in Other Visits:    0.9 %  sodium chloride infusion, , Intravenous, Once PRN, Ennever, Rose Phi, MD   albuterol (PROVENTIL) (2.5 MG/3ML) 0.083% nebulizer solution 2.5 mg, 2.5 mg, Nebulization, Once PRN, Ennever, Rose Phi, MD   sodium chloride flush (NS) 0.9 % injection 10 mL, 10 mL, Intravenous, PRN, [place], Sarah M, NP, 10 mL at 12/26/17 1130   sodium chloride flush (NS) 0.9 % injection 10 mL, 10 mL, Intravenous, PRN, Josph Macho, MD,  10 mL at 04/24/19 1305   sodium chloride flush (NS) 0.9 % injection 10 mL, 10 mL, Intravenous, PRN, Josph Macho, MD, 10 mL at 08/21/19 1233   sodium chloride flush (NS) 0.9 % injection 10 mL, 10 mL, Intracatheter, PRN, [place], Sarah M, NP, 10 mL at 10/01/19 1053   sodium chloride flush (NS) 0.9 % injection 10 mL, 10 mL, Intracatheter, PRN, [place], Sarah M, NP, 10 mL at 12/11/19 1414   Allergies  Allergen Reactions   Codeine Nausea Only     Review of Systems  Constitutional: Negative.   HENT:  Positive for postnasal drip.   Eyes:  Negative for blurred vision.  Respiratory: Negative.  Negative for shortness of breath.   Cardiovascular: Negative.  Negative for chest pain and palpitations.  Gastrointestinal: Negative.   Skin:        She is concerned about continued hair loss. She would like to start necessary treatments as needed. Denies change in her appetite/sleep habits. No h/o lupus.   Psychiatric/Behavioral: Negative.    All other systems reviewed and are negative.   Today's Vitals   08/10/20 1431  BP: 128/78  Pulse: 82  Temp: 98.6 F (37 C)  Weight: 173 lb 12.8 oz (78.8 kg)  Height: 5\' 2"  (1.575 m)    Body mass index is 31.79 kg/m.  Wt Readings from Last 3 Encounters:  09/01/20 172 lb (78 kg)  08/11/20 178 lb (80.7 kg)  08/10/20 173 lb 12.8 oz (78.8 kg)    Objective:  Physical Exam Vitals and nursing note reviewed.  Constitutional:      Appearance: Normal appearance.  HENT:     Head: Normocephalic and atraumatic.     Nose:     Comments: Masked     Mouth/Throat:     Comments: Masked  Eyes:     Extraocular Movements: Extraocular movements intact.  Cardiovascular:     Rate and Rhythm: Normal rate and regular rhythm.     Heart sounds: Normal heart sounds.  Pulmonary:     Effort: Pulmonary effort is normal.     Breath sounds: Normal breath sounds.  Musculoskeletal:     Cervical back: Normal range of motion.  Skin:    General: Skin is warm.     Comments: Patchy bald areas of scalp  Neurological:     General: No focal deficit present.     Mental Status: She is alert.  Psychiatric:        Mood and Affect: Mood normal.        Behavior: Behavior normal.        Assessment And Plan:     1. Essential (primary) hypertension Comments: Chronic, well controlled. I reviewed recent renal function as labs drawn at [facility]. I will add lipid panel to her next set of labs.  - Lipid panel; Future  2. Hair loss Comments: I wll refer her to Derm for further evaluation.  - Ambulatory referral to Dermatology  3. Postnasal drip Comments: Advised to take loratadine 10mg  daily.   4. Encounter for HCV screening test for low risk patient Comments: I will check HCV, Ab w/ her next set of labs.  - Hepatitis C antibody; Future   Patient was given opportunity to ask questions. Patient verbalized understanding of the plan and was able to repeat key elements of the plan. All questions were answered to their satisfaction.   I, Gwynneth Aliment, MD, have reviewed all documentation for this visit. The documentation on 09/13/20 for  the exam, diagnosis, procedures, and orders are all  accurate and complete.   IF YOU HAVE BEEN REFERRED TO A SPECIALIST, IT MAY TAKE 1-2 WEEKS TO SCHEDULE/PROCESS THE REFERRAL. IF YOU HAVE NOT HEARD FROM US/SPECIALIST IN TWO WEEKS, PLEASE GIVE [place] A CALL AT [phone].   THE PATIENT IS ENCOURAGED TO PRACTICE SOCIAL DISTANCING DUE TO THE COVID-19 PANDEMIC.

## 2020-08-11 ENCOUNTER — Inpatient Hospital Stay: Payer: Medicare Other | Attending: Hematology & Oncology

## 2020-08-11 ENCOUNTER — Inpatient Hospital Stay (HOSPITAL_BASED_OUTPATIENT_CLINIC_OR_DEPARTMENT_OTHER): Payer: Medicare Other | Admitting: Hematology & Oncology

## 2020-08-11 ENCOUNTER — Encounter: Payer: Self-pay | Admitting: Hematology & Oncology

## 2020-08-11 ENCOUNTER — Ambulatory Visit: Payer: Medicare Other | Admitting: Internal Medicine

## 2020-08-11 ENCOUNTER — Inpatient Hospital Stay: Payer: Medicare Other

## 2020-08-11 VITALS — BP 159/83 | HR 72 | Temp 98.5°F | Resp 16 | Wt 178.0 lb

## 2020-08-11 DIAGNOSIS — Z5112 Encounter for antineoplastic immunotherapy: Secondary | ICD-10-CM | POA: Insufficient documentation

## 2020-08-11 DIAGNOSIS — Z8542 Personal history of malignant neoplasm of other parts of uterus: Secondary | ICD-10-CM | POA: Insufficient documentation

## 2020-08-11 DIAGNOSIS — C541 Malignant neoplasm of endometrium: Secondary | ICD-10-CM

## 2020-08-11 DIAGNOSIS — Z923 Personal history of irradiation: Secondary | ICD-10-CM | POA: Insufficient documentation

## 2020-08-11 DIAGNOSIS — Z79811 Long term (current) use of aromatase inhibitors: Secondary | ICD-10-CM | POA: Insufficient documentation

## 2020-08-11 DIAGNOSIS — D509 Iron deficiency anemia, unspecified: Secondary | ICD-10-CM | POA: Insufficient documentation

## 2020-08-11 DIAGNOSIS — Z885 Allergy status to narcotic agent status: Secondary | ICD-10-CM | POA: Insufficient documentation

## 2020-08-11 DIAGNOSIS — C9001 Multiple myeloma in remission: Secondary | ICD-10-CM

## 2020-08-11 DIAGNOSIS — C9000 Multiple myeloma not having achieved remission: Secondary | ICD-10-CM

## 2020-08-11 DIAGNOSIS — Z9484 Stem cells transplant status: Secondary | ICD-10-CM | POA: Insufficient documentation

## 2020-08-11 LAB — CBC WITH DIFFERENTIAL (CANCER CENTER ONLY)
Abs Immature Granulocytes: 0.01 10*3/uL (ref 0.00–0.07)
Basophils Absolute: 0 10*3/uL (ref 0.0–0.1)
Basophils Relative: 1 %
Eosinophils Absolute: 0.1 10*3/uL (ref 0.0–0.5)
Eosinophils Relative: 3 %
HCT: 29.6 % — ABNORMAL LOW (ref 36.0–46.0)
Hemoglobin: 9.3 g/dL — ABNORMAL LOW (ref 12.0–15.0)
Immature Granulocytes: 0 %
Lymphocytes Relative: 19 %
Lymphs Abs: 0.8 10*3/uL (ref 0.7–4.0)
MCH: 25.6 pg — ABNORMAL LOW (ref 26.0–34.0)
MCHC: 31.4 g/dL (ref 30.0–36.0)
MCV: 81.5 fL (ref 80.0–100.0)
Monocytes Absolute: 0.6 10*3/uL (ref 0.1–1.0)
Monocytes Relative: 14 %
Neutro Abs: 2.5 10*3/uL (ref 1.7–7.7)
Neutrophils Relative %: 63 %
Platelet Count: 147 10*3/uL — ABNORMAL LOW (ref 150–400)
RBC: 3.63 MIL/uL — ABNORMAL LOW (ref 3.87–5.11)
RDW: 19.3 % — ABNORMAL HIGH (ref 11.5–15.5)
WBC Count: 4 10*3/uL (ref 4.0–10.5)
nRBC: 0 % (ref 0.0–0.2)

## 2020-08-11 LAB — CMP (CANCER CENTER ONLY)
ALT: 17 U/L (ref 0–44)
AST: 18 U/L (ref 15–41)
Albumin: 3.9 g/dL (ref 3.5–5.0)
Alkaline Phosphatase: 56 U/L (ref 38–126)
Anion gap: 6 (ref 5–15)
BUN: 12 mg/dL (ref 8–23)
CO2: 28 mmol/L (ref 22–32)
Calcium: 9.9 mg/dL (ref 8.9–10.3)
Chloride: 107 mmol/L (ref 98–111)
Creatinine: 0.79 mg/dL (ref 0.44–1.00)
GFR, Estimated: >60 mL/min
Glucose, Bld: 93 mg/dL (ref 70–99)
Potassium: 3.9 mmol/L (ref 3.5–5.1)
Sodium: 141 mmol/L (ref 135–145)
Total Bilirubin: 0.3 mg/dL (ref 0.3–1.2)
Total Protein: 5.9 g/dL — ABNORMAL LOW (ref 6.5–8.1)

## 2020-08-11 LAB — LACTATE DEHYDROGENASE: LDH: 254 U/L — ABNORMAL HIGH (ref 98–192)

## 2020-08-11 MED ORDER — SODIUM CHLORIDE 0.9 % IV SOLN
1.9000 mg/kg | Freq: Once | INTRAVENOUS | Status: AC
  Administered 2020-08-11: 145 mg via INTRAVENOUS
  Filled 2020-08-11: qty 2.9

## 2020-08-11 MED ORDER — DIPHENHYDRAMINE HCL 25 MG PO CAPS
50.0000 mg | ORAL_CAPSULE | Freq: Once | ORAL | Status: AC
Start: 2020-08-11 — End: 2020-08-11
  Administered 2020-08-11: 50 mg via ORAL

## 2020-08-11 MED ORDER — SODIUM CHLORIDE 0.9 % IV SOLN
Freq: Once | INTRAVENOUS | Status: AC
Start: 2020-08-11 — End: 2020-08-11
  Filled 2020-08-11: qty 250

## 2020-08-11 MED ORDER — PROCHLORPERAZINE MALEATE 10 MG PO TABS
10.0000 mg | ORAL_TABLET | Freq: Once | ORAL | Status: AC
Start: 2020-08-11 — End: 2020-08-11
  Administered 2020-08-11: 10 mg via ORAL

## 2020-08-11 MED ORDER — PROCHLORPERAZINE MALEATE 10 MG PO TABS
ORAL_TABLET | ORAL | Status: AC
  Filled 2020-08-11: qty 1

## 2020-08-11 MED ORDER — ACETAMINOPHEN 325 MG PO TABS
650.0000 mg | ORAL_TABLET | Freq: Once | ORAL | Status: AC
  Administered 2020-08-11: 650 mg via ORAL

## 2020-08-11 MED ORDER — DIPHENHYDRAMINE HCL 25 MG PO CAPS
ORAL_CAPSULE | ORAL | Status: AC
  Filled 2020-08-11: qty 2

## 2020-08-11 MED ORDER — ACETAMINOPHEN 325 MG PO TABS
ORAL_TABLET | ORAL | Status: AC
  Filled 2020-08-11: qty 2

## 2020-08-11 MED ORDER — SODIUM CHLORIDE 0.9% FLUSH
10.0000 mL | INTRAVENOUS | Status: DC | PRN
Start: 2020-08-11 — End: 2020-08-11
  Administered 2020-08-11: 10 mL
  Filled 2020-08-11: qty 10

## 2020-08-11 MED ORDER — DENOSUMAB 120 MG/1.7ML ~~LOC~~ SOLN: 120.0000 mg | Freq: Once | SUBCUTANEOUS | Status: DC

## 2020-08-11 MED ORDER — HEPARIN SOD (PORK) LOCK FLUSH 100 UNIT/ML IV SOLN
500.0000 [IU] | Freq: Once | INTRAVENOUS | Status: AC | PRN
  Administered 2020-08-11: 500 [IU]
  Filled 2020-08-11: qty 5

## 2020-08-11 NOTE — Progress Notes (Signed)
Hematology and Oncology Follow Up Visit  Emily Greer [id number] [birth year] 70 y.o. 08/11/2020   Principle Diagnosis:  Recurrent lambda light chain myeloma - nl cytogenetics History of recurrent endometrial carcinoma Iron deficiency anemia -blood loss   Past Therapy:             Status post second autologous stem cell transplant on 07/24/2014 Maintenance therapy with Pomalidomide/every 2 week Velcade - d/c'ed Radiation therapy for endometrial recurrence - completed 04/20/2015 Pomalyst/Kyprolis 70mg /m2 IV q 2 weeks - s/p cycle #12 - held on 12/26/2017 for hematuria Daratumumab/Pomalyst (1 mg) - status post cycle 19 -- d/c on 08/21/2019   Current Therapy:        Melflufen 40 mg IV q 4 weeks -- started on 08/27/2019, s/p cycle #2 --  D/c due to FDA removal Selinexor 60 mg po q week -- start on 01/19/2020 -- changed on 03/18/2020 -- d/c on 04/27/2020 Blenrep 2.5 mg/m2 IV q 3 week -- s/p cycle #3- start on 05/20/2020 Femara 2.5 mg po q day  Xgeva 120 mg subcu every 3 months - next dose in 10/2020 IV iron as indicated   Interim History:  Emily Greer is here today for follow-up.  She is responding very well to the Blenrep.  Her lambda light chain has been coming down so nicely.  Back when we started treatment the lambda light chain was 36.9 mg/dL.  We last saw her in mid June, the lambda light chain was 7 mg/dL.  Per the problem is that she has been having some eye issues.  She has cataracts.  She did have some ocular changes from the [facility].  We have held her treatment for several weeks.  She is still having some problems, mostly with the cataract.  She does have some dry eyes.  She has had no other issues.  There is no bleeding.  There is no change in bowel or bladder habits.  She has not had any diarrhea.  She has had no issues with nausea or vomiting.  There is no cough or shortness of breath.  She is little bit tired.  She took care of 2 grandsons for couple weeks.  She enjoyed  this however.  There is been no issues with rashes.  She has had no headache.  There has been no leg swelling..  She is on Femara for the uterine cancer.  This really has not been an issue for [place].  Overall, her performance status is ECOG 1.    Medications:  Allergies as of 08/11/2020       Reactions   Codeine Nausea Only        Medication List        Accurate as of August 11, 2020  9:20 AM. If you have any questions, ask your nurse or doctor.          acyclovir 400 MG tablet Commonly known as: ZOVIRAX TAKE 1 TABLET(400 MG) BY MOUTH TWICE DAILY   albuterol 108 (90 Base) MCG/ACT inhaler Commonly known as: VENTOLIN HFA Inhale 2 puffs into the lungs every 6 (six) hours as needed for wheezing or shortness of breath. 2 puffs 3 times daily x 5 days then every 6 hours as needed.   amLODipine 10 MG tablet Commonly known as: NORVASC TAKE 1 TABLET(10 MG) BY MOUTH EVERY MORNING   amoxicillin-clavulanate 875-125 MG tablet Commonly known as: AUGMENTIN   aspirin EC 81 MG tablet Take 81 mg by mouth daily.   benzonatate 100 MG capsule  Commonly known as: Lawyer Take 1 capsule (100 mg total) by mouth 3 (three) times daily as needed for cough.   CALCIUM 1200+D3 PO Take 1 tablet by mouth daily.   Carboxymethylcell-Glycerin PF 0.5-0.9 % Soln Place 2 drops into both eyes 4 (four) times daily.   cetirizine 10 MG tablet Commonly known as: ZYRTEC Take 1 tablet (10 mg total) by mouth at bedtime.   Darzalex 100 MG/5ML Generic drug: daratumumab   Fluocinolone Acetonide Scalp 0.01 % Oil Apply topically.   hydrochlorothiazide 12.5 MG capsule Commonly known as: MICROZIDE Take 12.5 mg by mouth every morning.   letrozole 2.5 MG tablet Commonly known as: FEMARA   letrozole 2.5 MG tablet Commonly known as: FEMARA Take 1 tablet (2.5 mg total) by mouth daily.   lidocaine-prilocaine cream Commonly known as: EMLA Apply 1 application topically as needed.   loperamide 2 MG  capsule Commonly known as: IMODIUM Take by mouth as needed for diarrhea or loose stools. Reported on 08/19/2015   loratadine 10 MG tablet Commonly known as: CLARITIN Take 10 mg by mouth every morning. Reported on 08/19/2015   metoprolol succinate 50 MG 24 hr tablet Commonly known as: TOPROL-XL TAKE 1 TABLET(50 MG) BY MOUTH EVERY MORNING   Minoxidil for Men 5 % Soln Generic drug: MINOXIDIL (TOPICAL) SMARTSIG:Sparingly Topical Every Night   Rogaine Mens 5 % Foam Generic drug: Minoxidil See admin instructions.   montelukast 10 MG tablet Commonly known as: SINGULAIR TAKE 1 TABLET(10 MG) BY MOUTH AT BEDTIME   ondansetron 8 MG tablet Commonly known as: Zofran Take 1 tablet (8 mg total) by mouth 2 (two) times daily as needed (Nausea or vomiting).   penicillin v potassium 500 MG tablet Commonly known as: VEETID   potassium chloride SA 20 MEQ tablet Commonly known as: KLOR-CON TAKE 1 TABLET(20 MEQ) BY MOUTH TWICE DAILY   PROBIOTIC DAILY PO Take by mouth daily.   triamcinolone 55 MCG/ACT Aero nasal inhaler Commonly known as: NASACORT Place 2 sprays into the nose as needed.   triamterene-hydrochlorothiazide 37.5-25 MG tablet Commonly known as: MAXZIDE-25 1 tablet by mouth at bedtime   Vitamin D3 50 MCG (2000 UT) Tabs Take 2 tablets by mouth daily.   Xiidra 5 % Soln Generic drug: Lifitegrast   Zinc 50 MG Tabs 1 tablet   zinc gluconate 50 MG tablet Take 50 mg by mouth daily.        Allergies:  Allergies  Allergen Reactions   Codeine Nausea Only    Past Medical History, Surgical history, Social history, and Family History were reviewed and updated.  Review of Systems: Review of Systems  Constitutional: Negative.   HENT: Negative.    Eyes: Negative.   Respiratory: Negative.    Cardiovascular: Negative.   Gastrointestinal: Negative.   Genitourinary: Negative.   Musculoskeletal: Negative.   Skin: Negative.   Neurological: Negative.   Endo/Heme/Allergies:  Negative.   Psychiatric/Behavioral: Negative.       Physical Exam:  weight is 178 lb (80.7 kg). Her oral temperature is 98.5 F (36.9 C). Her blood pressure is 159/83 (abnormal) and her pulse is 72. Her respiration is 16 and oxygen saturation is 99%.   Wt Readings from Last 3 Encounters:  08/11/20 178 lb (80.7 kg)  08/10/20 173 lb 12.8 oz (78.8 kg)  07/22/20 174 lb (78.9 kg)    Physical Exam Vitals reviewed.  HENT:     Head: Normocephalic and atraumatic.  Eyes:     Pupils: Pupils are equal, round, and  reactive to light.  Cardiovascular:     Rate and Rhythm: Normal rate and regular rhythm.     Heart sounds: Normal heart sounds.  Pulmonary:     Effort: Pulmonary effort is normal.     Breath sounds: Normal breath sounds.  Abdominal:     General: Bowel sounds are normal.     Palpations: Abdomen is soft.  Musculoskeletal:        General: No tenderness or deformity. Normal range of motion.     Cervical back: Normal range of motion.  Lymphadenopathy:     Cervical: No cervical adenopathy.  Skin:    General: Skin is warm and dry.     Findings: No erythema or rash.  Neurological:     Mental Status: She is alert and oriented to person, place, and time.  Psychiatric:        Behavior: Behavior normal.        Thought Content: Thought content normal.        Judgment: Judgment normal.     Lab Results  Component Value Date   WBC 4.0 08/11/2020   HGB 9.3 (L) 08/11/2020   HCT 29.6 (L) 08/11/2020   MCV 81.5 08/11/2020   PLT 147 (L) 08/11/2020   Lab Results  Component Value Date   FERRITIN 2,472 (H) 04/27/2020   IRON 98 04/27/2020   TIBC 361 04/27/2020   UIBC 263 04/27/2020   IRONPCTSAT 27 04/27/2020   Lab Results  Component Value Date   RETICCTPCT 1.6 11/05/2019   RBC 3.63 (L) 08/11/2020   Lab Results  Component Value Date   KPAFRELGTCHN 1.5 (L) 07/22/2020   LAMBDASER 65.5 (H) 07/22/2020   KAPLAMBRATIO 0.02 (L) 07/22/2020   Lab Results  Component Value Date    IGGSERUM 392 (L) 07/22/2020   IGGSERUM 447 (L) 07/22/2020   IGA 10 (L) 07/22/2020   IGA 12 (L) 07/22/2020   IGMSERUM <5 (L) 07/22/2020   IGMSERUM <5 (L) 07/22/2020   Lab Results  Component Value Date   TOTALPROTELP 5.9 (L) 07/22/2020   ALBUMINELP 3.2 07/22/2020   A1GS 0.3 07/22/2020   A2GS 1.1 (H) 07/22/2020   BETS 1.0 07/22/2020   BETA2SER 0.4 11/23/2014   GAMS 0.3 (L) 07/22/2020   MSPIKE Not Observed 07/22/2020   SPEI Comment 05/20/2020     Chemistry      Component Value Date/Time   NA 141 07/22/2020 0945   NA 141 01/10/2017 1115   NA 140 06/21/2016 0918   K 2.7 (LL) 07/22/2020 0945   K 4.0 01/10/2017 1115   K 4.3 06/21/2016 0918   CL 103 07/22/2020 0945   CL 106 01/10/2017 1115   CO2 30 07/22/2020 0945   CO2 27 01/10/2017 1115   CO2 20 (L) 06/21/2016 0918   BUN 11 07/22/2020 0945   BUN 15 01/10/2017 1115   BUN 15.8 06/21/2016 0918   CREATININE 0.79 07/22/2020 0945   CREATININE 1.0 01/10/2017 1115   CREATININE 0.8 06/21/2016 0918      Component Value Date/Time   CALCIUM 9.1 07/22/2020 0945   CALCIUM 9.5 01/10/2017 1115   CALCIUM 9.4 06/21/2016 0918   ALKPHOS 55 07/22/2020 0945   ALKPHOS 40 01/10/2017 1115   ALKPHOS 66 06/21/2016 0918   AST 19 07/22/2020 0945   AST 17 06/21/2016 0918   ALT 17 07/22/2020 0945   ALT 19 01/10/2017 1115   ALT 37 06/21/2016 0918   BILITOT 0.2 (L) 07/22/2020 0945   BILITOT 0.32 06/21/2016 1610  Impression and Plan: Ms. Cobler is a very pleasant 70 yo African American female with recurrent lambda light chain myeloma. Her second stem cell transplant for light chain myeloma was in June 2016.   She also has localized recurrent endometrial cancer (followed by gyn onc Dr. Nelly Rout) and is currently on Femara.   I am going to decrease the dose of Blenrep to help with the ocular issues.  Hopefully, we will still see a response with the Blenrep.  She is somewhat anemic.  I suppose we could always consider using all ESA.  I  am just happy that her quality life is doing well right now.  We will plan to get her back in another 3 weeks for the next cycle of Blenrep.      Josph Macho, MD 7/13/20229:20 AM

## 2020-08-11 NOTE — Patient Instructions (Signed)

## 2020-08-11 NOTE — Patient Instructions (Signed)
[facility]  Discharge Instructions: Thank you for choosing [facility] to provide your oncology and hematology care.   If you have a lab appointment with the [facility], please go directly to the [facility] and check in at the registration area.  Wear comfortable clothing and clothing appropriate for easy access to any Portacath or PICC line.   We strive to give you quality time with your provider. You may need to reschedule your appointment if you arrive late (15 or more minutes).  Arriving late affects you and other patients whose appointments are after yours.  Also, if you miss three or more appointments without notifying the office, you may be dismissed from the clinic at the provider's discretion.      For prescription refill requests, have your pharmacy contact our office and allow 72 hours for refills to be completed.    Today you received the following chemotherapy and/or immunotherapy agents Blenrep    To help prevent nausea and vomiting after your treatment, we encourage you to take your nausea medication as directed.  BELOW ARE SYMPTOMS THAT SHOULD BE REPORTED IMMEDIATELY: *FEVER GREATER THAN 100.4 F (38 C) OR HIGHER *CHILLS OR SWEATING *NAUSEA AND VOMITING THAT IS NOT CONTROLLED WITH YOUR NAUSEA MEDICATION *UNUSUAL SHORTNESS OF BREATH *UNUSUAL BRUISING OR BLEEDING *URINARY PROBLEMS (pain or burning when urinating, or frequent urination) *BOWEL PROBLEMS (unusual diarrhea, constipation, pain near the anus) TENDERNESS IN MOUTH AND THROAT WITH OR WITHOUT PRESENCE OF ULCERS (sore throat, sores in mouth, or a toothache) UNUSUAL RASH, SWELLING OR PAIN  UNUSUAL VAGINAL DISCHARGE OR ITCHING   Items with * indicate a potential emergency and should be followed up as soon as possible or go to the Emergency Department if any problems should occur.  Please show the CHEMOTHERAPY ALERT CARD or IMMUNOTHERAPY ALERT CARD at check-in to the  Emergency Department and triage nurse. Should you have questions after your visit or need to cancel or reschedule your appointment, please contact [facility]  [phone] and follow the prompts.  Office hours are 8:00 a.m. to 4:30 p.m. Monday - Friday. Please note that voicemails left after 4:00 p.m. may not be returned until the following business day.  We are closed weekends and major holidays. You have access to a nurse at all times for urgent questions. Please call the main number to the clinic [phone] and follow the prompts.  For any non-urgent questions, you may also contact your provider using MyChart. We now offer e-Visits for anyone 33 and older to request care online for non-urgent symptoms. For details visit [email or web address].   Also download the MyChart app! Go to the app store, search "MyChart", open the app, select [facility], and log in with your MyChart username and password.  Due to Covid, a mask is required upon entering the hospital/clinic. If you do not have a mask, one will be given to you upon arrival. For doctor visits, patients may have 1 support person aged 36 or older with them. For treatment visits, patients cannot have anyone with them due to current Covid guidelines and our immunocompromised population.

## 2020-08-12 LAB — IGG, IGA, IGM
IgA: 9 mg/dL — ABNORMAL LOW (ref 87–352)
IgG (Immunoglobin G), Serum: 366 mg/dL — ABNORMAL LOW (ref 586–1602)
IgM (Immunoglobulin M), Srm: <5 mg/dL — ABNORMAL LOW (ref 26–217)

## 2020-08-12 LAB — KAPPA/LAMBDA LIGHT CHAINS
Kappa free light chain: 3.3 mg/L (ref 3.3–19.4)
Kappa, lambda light chain ratio: 0.06 — ABNORMAL LOW (ref 0.26–1.65)
Lambda free light chains: 58.8 mg/L — ABNORMAL HIGH (ref 5.7–26.3)

## 2020-08-13 ENCOUNTER — Telehealth: Payer: Medicare Other

## 2020-08-13 ENCOUNTER — Ambulatory Visit (INDEPENDENT_AMBULATORY_CARE_PROVIDER_SITE_OTHER): Payer: Medicare Other

## 2020-08-13 DIAGNOSIS — I10 Essential (primary) hypertension: Secondary | ICD-10-CM | POA: Diagnosis not present

## 2020-08-13 DIAGNOSIS — C9000 Multiple myeloma not having achieved remission: Secondary | ICD-10-CM

## 2020-08-13 DIAGNOSIS — E6609 Other obesity due to excess calories: Secondary | ICD-10-CM

## 2020-08-13 DIAGNOSIS — Z6833 Body mass index (BMI) 33.0-33.9, adult: Secondary | ICD-10-CM

## 2020-08-13 LAB — PROTEIN ELECTROPHORESIS, SERUM, WITH REFLEX
A/G Ratio: 1.4 (ref 0.7–1.7)
Albumin ELP: 3.5 g/dL (ref 2.9–4.4)
Alpha-1-Globulin: 0.2 g/dL (ref 0.0–0.4)
Alpha-2-Globulin: 0.9 g/dL (ref 0.4–1.0)
Beta Globulin: 1 g/dL (ref 0.7–1.3)
Gamma Globulin: 0.3 g/dL — ABNORMAL LOW (ref 0.4–1.8)
Globulin, Total: 2.5 g/dL (ref 2.2–3.9)
Total Protein ELP: 6 g/dL (ref 6.0–8.5)

## 2020-08-16 ENCOUNTER — Encounter: Payer: Self-pay | Admitting: *Deleted

## 2020-08-20 ENCOUNTER — Telehealth: Payer: Self-pay | Admitting: *Deleted

## 2020-08-20 ENCOUNTER — Encounter: Payer: Self-pay | Admitting: Hematology & Oncology

## 2020-08-20 DIAGNOSIS — H16143 Punctate keratitis, bilateral: Secondary | ICD-10-CM | POA: Diagnosis not present

## 2020-08-20 DIAGNOSIS — H25813 Combined forms of age-related cataract, bilateral: Secondary | ICD-10-CM | POA: Diagnosis not present

## 2020-08-20 DIAGNOSIS — H40033 Anatomical narrow angle, bilateral: Secondary | ICD-10-CM | POA: Diagnosis not present

## 2020-08-20 NOTE — Telephone Encounter (Signed)
Recd Eye exam results from Dr Jerline Pain at [facility].  Reviewed with Dr Marin Olp. Patient status form and Authorization code generated for next treatment cycle.

## 2020-08-23 NOTE — Patient Instructions (Signed)
Goals Addressed    5  Infection Prevention or Managed   On track    Timeframe:  Short-Term Goal Priority:  High Start Date:  01/13/20                           Expected End Date: 01/12/21   Follow up date: 12/06/20  - notify MD if upper respiratory symptoms worsen or persist - stay well hydrated  - get plenty of rest - eat a healthy diet - avoid being around others who may be ill                         Keep Pain Under Control-Cancer Treatment   On track    Timeframe:  Long-Range Goal Priority:  High Start Date: 01/13/20                            Expected End Date: 01/12/21                     Follow Up Date: 12/06/20   - call for prescription refill 3 days before needed - develop a personal pain management plan - plan exercise or activity when pain is best controlled - prioritize tasks for the day    Why is this important?   Day-to-day life can be hard when you have pain.  Even a small change in emotion or a physical problem can make pain better or worse.  Coping with pain depends on how the mind and body reacts to pain.  Pain medicine is just one piece of the treatment puzzle.  There are many tools to help manage pain. A combination of them can be used to best meet your needs.     Notes:      COMPLETED: Make and Keep All Appointments       Timeframe:  Long-Range Goal Priority:  High Start Date:  01/13/20                          Expected End Date:  08/13/20                      Follow Up Date: 08/13/20   - ask family or friend for a ride - call to cancel if needed - keep a calendar with appointment dates    Why is this important?   Part of staying healthy is seeing the doctor for follow-up care.  If you forget your appointments, there are some things you can do to stay on track.    Notes:      Manage Fatigue (Tiredness- Cancer Treatment)   On track    Timeframe:  Long-Range Goal Priority:  High Start Date: 01/13/20                            Expected End  Date: 01/12/21                  Follow Up Date: 12/06/20   - eat healthy - get a least 8 hours of sleep at night - maintain healthy weight - take a warm shower or bath before bed - use meditation or relaxation techniques    Why is this important?   Cancer treatment and its side effects can drain your  energy. It can keep you from doing things you would like to do.  There are many things that you can do to manage fatigue.    Notes:      Matintain My Quality of Life   On track    Timeframe:  Long-Range Goal Priority:  High Start Date:  01/13/20                           Expected End Date:  01/12/21                 Follow Up Date: 12/06/20   - discuss my treatment options with the doctor or nurse - make shared treatment decisions with doctor - name a health care proxy (decision maker)    Why is this important?   Having a long-term illness can be scary.  It can also be stressful for you and your caregiver.  These steps may help.    Notes:      Track and Manage My Blood Pressure-Hypertension   On track    Timeframe:  Long-Range Goal Priority:  High Start Date:  01/13/20                           Expected End Date:  01/12/21                     Follow Up Date: 12/06/20    - check blood pressure 3 times per week - write blood pressure results in a log or diary    Why is this important?   You won't feel high blood pressure, but it can still hurt your blood vessels.  High blood pressure can cause heart or kidney problems. It can also cause a stroke.  Making lifestyle changes like losing a [place] weight or eating less salt will help.  Checking your blood pressure at home and at different times of the day can help to control blood pressure.  If the doctor prescribes medicine remember to take it the way the doctor ordered.  Call the office if you cannot afford the medicine or if there are questions about it.     Notes:

## 2020-08-23 NOTE — Chronic Care Management (AMB) (Signed)
Chronic Care Management   CCM RN Visit Note  08/13/2020 Name: Emily Greer MRN: [id number] DOB: 02-13-1950  Subjective: Emily Greer is a 70 y.o. year old female who is a primary care patient of [name], MD. The care management team was consulted for assistance with disease management and care coordination needs.    Engaged with patient by telephone for follow up visit in response to provider referral for case management and/or care coordination services.   Consent to Services:  The patient was given information about Chronic Care Management services, agreed to services, and gave verbal consent prior to initiation of services.  Please see initial visit note for detailed documentation.   Patient agreed to services and verbal consent obtained.   Assessment: Review of patient past medical history, allergies, medications, health status, including review of consultants reports, laboratory and other test data, was performed as part of comprehensive evaluation and provision of chronic care management services.   SDOH (Social Determinants of Health) assessments and interventions performed:  yes, no acute challenges   CCM Care Plan  Allergies  Allergen Reactions   Codeine Nausea Only    Outpatient Encounter Medications as of 08/13/2020  Medication Sig Note   acyclovir (ZOVIRAX) 400 MG tablet TAKE 1 TABLET(400 MG) BY MOUTH TWICE DAILY    albuterol (VENTOLIN HFA) 108 (90 Base) MCG/ACT inhaler Inhale 2 puffs into the lungs every 6 (six) hours as needed for wheezing or shortness of breath. 2 puffs 3 times daily x 5 days then every 6 hours as needed.    amLODipine (NORVASC) 10 MG tablet TAKE 1 TABLET(10 MG) BY MOUTH EVERY MORNING    amoxicillin-clavulanate (AUGMENTIN) 875-125 MG tablet     aspirin EC 81 MG tablet Take 81 mg by mouth daily.    benzonatate (TESSALON PERLES) 100 MG capsule Take 1 capsule (100 mg total) by mouth 3 (three) times daily as needed for cough. (Patient not  taking: Reported on 08/10/2020)    Calcium-Magnesium-Vitamin D (CALCIUM 1200+D3 PO) Take 1 tablet by mouth daily.    Carboxymethylcell-Glycerin PF 0.5-0.9 % SOLN Place 2 drops into both eyes 4 (four) times daily.    cetirizine (ZYRTEC) 10 MG tablet Take 1 tablet (10 mg total) by mouth at bedtime.    Cholecalciferol (VITAMIN D3) 2000 UNITS TABS Take 2 tablets by mouth daily.  05/20/2020: Patient stated she is taking this twice a day.   daratumumab (DARZALEX) 100 MG/5ML     Fluocinolone Acetonide Scalp 0.01 % OIL Apply topically.    hydrochlorothiazide (MICROZIDE) 12.5 MG capsule Take 12.5 mg by mouth every morning.    letrozole (FEMARA) 2.5 MG tablet Take 1 tablet (2.5 mg total) by mouth daily.    letrozole (FEMARA) 2.5 MG tablet     lidocaine-prilocaine (EMLA) cream Apply 1 application topically as needed.    loperamide (IMODIUM) 2 MG capsule Take by mouth as needed for diarrhea or loose stools. Reported on 08/19/2015    loratadine (CLARITIN) 10 MG tablet Take 10 mg by mouth every morning. Reported on 08/19/2015    metoprolol succinate (TOPROL-XL) 50 MG 24 hr tablet TAKE 1 TABLET(50 MG) BY MOUTH EVERY MORNING    Minoxidil (ROGAINE MENS) 5 % FOAM See admin instructions.    MINOXIDIL FOR MEN 5 % SOLN SMARTSIG:Sparingly Topical Every Night    montelukast (SINGULAIR) 10 MG tablet TAKE 1 TABLET(10 MG) BY MOUTH AT BEDTIME    ondansetron (ZOFRAN) 8 MG tablet Take 1 tablet (8 mg total) by mouth 2 (  two) times daily as needed (Nausea or vomiting).    penicillin v potassium (VEETID) 500 MG tablet     potassium chloride SA (KLOR-CON) 20 MEQ tablet TAKE 1 TABLET(20 MEQ) BY MOUTH TWICE DAILY    Probiotic Product (PROBIOTIC DAILY PO) Take by mouth daily.    triamcinolone (NASACORT) 55 MCG/ACT AERO nasal inhaler Place 2 sprays into the nose as needed.     triamterene-hydrochlorothiazide (MAXZIDE-25) 37.5-25 MG tablet 1 tablet by mouth at bedtime    XIIDRA 5 % SOLN     Zinc 50 MG TABS 1 tablet    zinc gluconate  50 MG tablet Take 50 mg by mouth daily.    Facility-Administered Encounter Medications as of 08/13/2020  Medication   0.9 %  sodium chloride infusion   albuterol (PROVENTIL) (2.5 MG/3ML) 0.083% nebulizer solution 2.5 mg   sodium chloride flush (NS) 0.9 % injection 10 mL   sodium chloride flush (NS) 0.9 % injection 10 mL   sodium chloride flush (NS) 0.9 % injection 10 mL   sodium chloride flush (NS) 0.9 % injection 10 mL   sodium chloride flush (NS) 0.9 % injection 10 mL    Patient Active Problem List   Diagnosis Date Noted   Goals of care, counseling/discussion 08/21/2019   Osteonecrosis, unspecified (HCC) 03/31/2019   VAIN I (vaginal intraepithelial neoplasia grade I) 10/10/2018   Influenza B 02/18/2018   Flu-like symptoms 02/18/2018   Excessive cerumen in both ear canals 02/18/2018   Abnormal Pap smear of vagina 12/30/2015   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) 01/29/2015   Iron deficiency anemia due to chronic blood loss    HCAP (healthcare-associated pneumonia) 12/29/2014   UTI (lower urinary tract infection) 12/29/2014   Hypokalemia 12/29/2014   CAP (community acquired pneumonia) 12/29/2014   Bone marrow transplant status (HCC) 07/25/2014   S/P autologous bone marrow transplantation (HCC) 07/25/2014   Fever 05/31/2014   ARF (acute renal failure) (HCC) 05/31/2014   Anemia 05/31/2014   Renal failure    Examination of participant in clinical trial 05/19/2014   Encounter for examination for normal comparison or control in clinical research program 05/19/2014   Kahler disease (HCC) 05/05/2014   Multiple myeloma in remission (HCC) 05/05/2014   History of radiation therapy    Endometrial carcinoma (HCC) 09/28/2011   Pregnancy induced hypertension    Elevated hemoglobin A1c    Myeloma (HCC) 01/17/2011   Lambda light chain myeloma (HCC) 11/11/2007   Vaginal atrophy 12/19/2006   Increased BMI 12/18/2005   Hypertension 11/28/1997   H/O  multiple myeloma 11/28/1997   Staphylococcus aureus bacteremia 11/28/1997   Staphylococcus epidermidis bacteremia 11/28/1997   Essential (primary) hypertension 11/28/1997    Conditions to be addressed/monitored: Essential Hypertension, Class 1 Obesity, Multiple Myeloma  Care Plan : General Plan of Care (Adult)  Updates made by Riley Churches, RN since 08/13/2020 12:00 AM     Problem: Quality of Life (General Plan of Care)   Priority: High     Long-Range Goal: Quality of Life Maintained   Start Date: 01/13/2020  Expected End Date: 01/12/2021  Recent Progress: On track  Priority: High  Note:   Current Barriers:  Ineffective Self Health Maintenance Currently UNABLE TO independently self manage needs related to chronic health conditions.  Caregiver for spouse and elderly mother  Knowledge Deficits related to short term plan for care coordination needs and long term plans for chronic disease management needs Nurse Case Manager Clinical Goal(s):  Patient will  work with care management team to address care coordination and chronic disease management needs related to Disease Management Educational Needs Care Coordination Medication Management and Education Psychosocial Support Caregiver Stress support   Interventions:  Collaboration with [name], MD regarding development and update of comprehensive plan of care as evidenced by provider attestation and co-signature Inter-disciplinary care team collaboration (see longitudinal plan of care) Assessed patient's thoughts about quality of life, goals and expectations, and dissatisfaction or desire to improve.  Identified issues of primary importance such as mental health, illness, exercise tolerance, pain, sexual function and intimacy, cognitive change, social isolation, finances and relationships.   Promoted access to services in the community to support independence such as support groups, home visiting programs, financial  assistance, handicapped parking tags, durable medical equipment and emergency responder.  Discussed plans with patient for ongoing care management follow up and provided patient with direct contact information for care management team Patient Goals/Self Care Activities: - call for prescription refill 3-7 days before needed - develop a personal pain management plan - plan exercise or activity when pain is best controlled - prioritize tasks for the day - keep a calendar with appointment dates- discuss my treatment options with the doctor or nurse - make shared treatment decisions with doctor - name a health care proxy (decision maker)  Follow Up Plan: Telephone follow up appointment with care management team member scheduled for: 12/06/20    Care Plan : Cancer Treatment Phase (Adult)  Updates made by Riley Churches, RN since 08/13/2020 12:00 AM     Problem: Fatigue   Priority: High     Long-Range Goal: Fatigue Managed   Start Date: 01/13/2020  Expected End Date: 01/12/2021  Recent Progress: On track  Priority: High  Note:   Current Barriers:  Ineffective Self Health Maintenance 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  Knowledge Deficits related to short term plan for care coordination needs and long term plans for chronic disease management needs Nurse Case Manager Clinical Goal(s):  Patient will work with care management team to address care coordination and chronic disease management needs related to Disease Management Educational Needs Care Coordination Medication Management and Education Psychosocial Support Caregiver Stress support   Interventions:  Collaboration with [name], MD regarding development and update of comprehensive plan of care as evidenced by provider attestation and co-signature Inter-disciplinary care team collaboration (see longitudinal plan of care) Assessed  severity of fatigue using a validated scale, quantitative (1 to 10) or word scale (mild, moderate, severe) at initial visit, at regular intervals and following treatment.  Assessed for causative factors, such as chemotherapy or radiation therapy, onset, pattern, duration, change over time, alleviating and contributing factors, as well as degree of interference with daily function.  Encouraged physical activity, as appropriate, both aerobic and strength-building; consider beginning with low-level activities and increase as conditioning and confidence improves.  Assessed degree of interference with daily function, presence of treatment effects such as anemia, bone metastases, fever or infection, history or risk of falls and conditioning level.  Offered reassurance that treatment-related fatigue is not necessarily an indicator of disease progression.  Discussed plans with patient for ongoing care management follow up and provided patient with direct contact information for care management team Patient Goals/Self Care Activities:  - eat healthy - get a least 8 hours of sleep at night - maintain healthy weight - take a warm shower or bath before bed - use  meditation or relaxation techniques  Follow Up Plan: Telephone follow up appointment with care management team member scheduled for: 12/06/20    Problem: Infection   Priority: High     Long-Range Goal: Infection Prevented or Managed   Start Date: 01/13/2020  Expected End Date: 01/12/2021  Recent Progress: On track  Priority: High  Note:   Current Barriers:  Ineffective Self Health Maintenance 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  Knowledge Deficits related to short term plan for care coordination needs and long term plans for chronic disease management needs Nurse Case Manager Clinical Goal(s):  Patient will work with care management team to  address care coordination and chronic disease management needs related to Disease Management Educational Needs Care Coordination Medication Management and Education Psychosocial Support Caregiver Stress support   Interventions:  Collaboration with [name], MD regarding development and update of comprehensive plan of care as evidenced by provider attestation and co-signature Inter-disciplinary care team collaboration (see longitudinal plan of care) Assessed for ongoing Cancer treatment per Oncology Reviewed and discussed the following Assessment/Plan completed on 04/27/20:  Review of patient status, including review of consultants reports, relevant laboratory and other test results, and medications completed. Reviewed medications with patient and discussed importance of medication adherence Reviewed scheduled/upcoming provider appointments including: next PCP follow up scheduled for 12/02/20 @10 :15 AM Determined patient has a good understanding of her prescribed treatment plan related to her Cancer treatment Discussed plans with patient for ongoing care management follow up and provided patient with direct contact information for care management team Patient Goals/Self Care Activities:   - notify MD of any/all symptoms suggestive of infection promptly - stay well hydrated  - get plenty of rest - eat a healthy diet - avoid being around others who may be ill   Follow Up Plan: Telephone follow up appointment with care management team member scheduled for: 12/06/20    Care Plan : Hypertension (Adult)  Updates made by Riley Churches, RN since 08/13/2020 12:00 AM     Problem: Disease Progression (Hypertension)   Priority: High     Long-Range Goal: Disease Progression Prevented or Minimized   Start Date: 01/13/2020  Expected End Date: 01/12/2021  Recent Progress: On track  Priority: High  Note:   Objective:  Last practice recorded BP readings:  BP Readings from Last 3  Encounters:  08/11/20 (!) 159/83  08/10/20 128/78  07/22/20 140/68   Most recent eGFR/CrCl:  Lab Results  Component Value Date   EGFR 86 (L) 06/21/2016    No components found for: CRCL Current Barriers:  Knowledge Deficits related to basic understanding of hypertension pathophysiology and self care management Caregiver for spouse and elderly mother  Case Manager Clinical Goal(s):  Patient will demonstrate improved adherence to prescribed treatment plan for hypertension as evidenced by taking all medications as prescribed, monitoring and recording blood pressure as directed, adhering to low sodium/DASH diet Interventions:  Collaboration with [name], MD regarding development and update of comprehensive plan of care as evidenced by provider attestation and co-signature Inter-disciplinary care team collaboration (see longitudinal plan of care) Evaluation of current treatment plan related to hypertension self management and patient's adherence to plan as established by provider. Provided education to patient re: stroke prevention, s/s of heart attack and stroke, DASH diet, complications of uncontrolled blood pressure Reviewed medications with patient and discussed importance of compliance Advised patient, providing education and rationale, to monitor blood pressure  daily and record, calling PCP for findings outside established parameters.  Discussed plans with patient for ongoing care management follow up and provided patient with direct contact information for care management team Patient Goals/Self-Care Activities Self administers medications as prescribed Attends all scheduled provider appointments Calls provider office for new concerns, questions, or BP outside discussed parameters Checks BP and records as discussed Follows a low sodium diet/DASH diet - check blood pressure 3 times per week - write blood pressure results in a log or diary  Follow Up Plan: Telephone follow up  appointment with care management team member scheduled for: 12/06/20    Plan:Telephone follow up appointment with care management team member scheduled for:  12/06/20  Delsa Sale, RN, BSN, CCM Care Management Coordinator [facility] Care Management/[facility]  Direct Phone: [phone]

## 2020-09-01 ENCOUNTER — Inpatient Hospital Stay: Payer: Medicare Other

## 2020-09-01 ENCOUNTER — Inpatient Hospital Stay (HOSPITAL_BASED_OUTPATIENT_CLINIC_OR_DEPARTMENT_OTHER): Payer: Medicare Other | Admitting: Family

## 2020-09-01 ENCOUNTER — Encounter: Payer: Self-pay | Admitting: Family

## 2020-09-01 ENCOUNTER — Other Ambulatory Visit: Payer: Self-pay

## 2020-09-01 ENCOUNTER — Inpatient Hospital Stay: Payer: Medicare Other | Attending: Hematology & Oncology

## 2020-09-01 VITALS — BP 147/82 | HR 77 | Temp 99.2°F | Resp 17 | Ht 62.0 in | Wt 172.0 lb

## 2020-09-01 DIAGNOSIS — Z923 Personal history of irradiation: Secondary | ICD-10-CM | POA: Insufficient documentation

## 2020-09-01 DIAGNOSIS — D500 Iron deficiency anemia secondary to blood loss (chronic): Secondary | ICD-10-CM | POA: Diagnosis not present

## 2020-09-01 DIAGNOSIS — C541 Malignant neoplasm of endometrium: Secondary | ICD-10-CM | POA: Diagnosis not present

## 2020-09-01 DIAGNOSIS — R58 Hemorrhage, not elsewhere classified: Secondary | ICD-10-CM | POA: Insufficient documentation

## 2020-09-01 DIAGNOSIS — Z5112 Encounter for antineoplastic immunotherapy: Secondary | ICD-10-CM | POA: Diagnosis not present

## 2020-09-01 DIAGNOSIS — R6889 Other general symptoms and signs: Secondary | ICD-10-CM | POA: Insufficient documentation

## 2020-09-01 DIAGNOSIS — C9000 Multiple myeloma not having achieved remission: Secondary | ICD-10-CM

## 2020-09-01 DIAGNOSIS — Z885 Allergy status to narcotic agent status: Secondary | ICD-10-CM | POA: Diagnosis not present

## 2020-09-01 DIAGNOSIS — Z9484 Stem cells transplant status: Secondary | ICD-10-CM | POA: Diagnosis not present

## 2020-09-01 DIAGNOSIS — R609 Edema, unspecified: Secondary | ICD-10-CM | POA: Insufficient documentation

## 2020-09-01 DIAGNOSIS — R197 Diarrhea, unspecified: Secondary | ICD-10-CM | POA: Insufficient documentation

## 2020-09-01 DIAGNOSIS — Z8542 Personal history of malignant neoplasm of other parts of uterus: Secondary | ICD-10-CM | POA: Diagnosis not present

## 2020-09-01 DIAGNOSIS — C9001 Multiple myeloma in remission: Secondary | ICD-10-CM

## 2020-09-01 LAB — CMP (CANCER CENTER ONLY)
ALT: 14 U/L (ref 0–44)
AST: 18 U/L (ref 15–41)
Albumin: 4 g/dL (ref 3.5–5.0)
Alkaline Phosphatase: 51 U/L (ref 38–126)
Anion gap: 8 (ref 5–15)
BUN: 12 mg/dL (ref 8–23)
CO2: 31 mmol/L (ref 22–32)
Calcium: 9.5 mg/dL (ref 8.9–10.3)
Chloride: 102 mmol/L (ref 98–111)
Creatinine: 0.71 mg/dL (ref 0.44–1.00)
GFR, Estimated: >60 mL/min (ref >60)
Glucose, Bld: 96 mg/dL (ref 70–99)
Potassium: 3.4 mmol/L — ABNORMAL LOW (ref 3.5–5.1)
Sodium: 141 mmol/L (ref 135–145)
Total Bilirubin: 0.3 mg/dL (ref 0.3–1.2)
Total Protein: 6.4 g/dL — ABNORMAL LOW (ref 6.5–8.1)

## 2020-09-01 LAB — CBC WITH DIFFERENTIAL (CANCER CENTER ONLY)
Abs Immature Granulocytes: 0.01 10*3/uL (ref 0.00–0.07)
Basophils Absolute: 0.1 10*3/uL (ref 0.0–0.1)
Basophils Relative: 1 %
Eosinophils Absolute: 0.1 10*3/uL (ref 0.0–0.5)
Eosinophils Relative: 2 %
HCT: 30.9 % — ABNORMAL LOW (ref 36.0–46.0)
Hemoglobin: 9.8 g/dL — ABNORMAL LOW (ref 12.0–15.0)
Immature Granulocytes: 0 %
Lymphocytes Relative: 14 %
Lymphs Abs: 0.6 10*3/uL — ABNORMAL LOW (ref 0.7–4.0)
MCH: 24.6 pg — ABNORMAL LOW (ref 26.0–34.0)
MCHC: 31.7 g/dL (ref 30.0–36.0)
MCV: 77.6 fL — ABNORMAL LOW (ref 80.0–100.0)
Monocytes Absolute: 0.6 10*3/uL (ref 0.1–1.0)
Monocytes Relative: 12 %
Neutro Abs: 3.2 10*3/uL (ref 1.7–7.7)
Neutrophils Relative %: 71 %
Platelet Count: 150 10*3/uL (ref 150–400)
RBC: 3.98 MIL/uL (ref 3.87–5.11)
RDW: 19.1 % — ABNORMAL HIGH (ref 11.5–15.5)
WBC Count: 4.5 10*3/uL (ref 4.0–10.5)
nRBC: 0 % (ref 0.0–0.2)

## 2020-09-01 LAB — LACTATE DEHYDROGENASE: LDH: 278 U/L — ABNORMAL HIGH (ref 98–192)

## 2020-09-01 MED ORDER — DIPHENHYDRAMINE HCL 25 MG PO CAPS
50.0000 mg | ORAL_CAPSULE | Freq: Once | ORAL | Status: AC
  Administered 2020-09-01: 50 mg via ORAL

## 2020-09-01 MED ORDER — DENOSUMAB 120 MG/1.7ML ~~LOC~~ SOLN
120.0000 mg | Freq: Once | SUBCUTANEOUS | Status: AC
  Administered 2020-09-01: 120 mg via SUBCUTANEOUS

## 2020-09-01 MED ORDER — PROCHLORPERAZINE MALEATE 10 MG PO TABS
ORAL_TABLET | ORAL | Status: AC
  Filled 2020-09-01: qty 1

## 2020-09-01 MED ORDER — DENOSUMAB 120 MG/1.7ML ~~LOC~~ SOLN
SUBCUTANEOUS | Status: AC
  Filled 2020-09-01: qty 1.7

## 2020-09-01 MED ORDER — HEPARIN SOD (PORK) LOCK FLUSH 100 UNIT/ML IV SOLN
500.0000 [IU] | Freq: Once | INTRAVENOUS | Status: AC | PRN
  Administered 2020-09-01: 500 [IU]
  Filled 2020-09-01: qty 5

## 2020-09-01 MED ORDER — SODIUM CHLORIDE 0.9 % IV SOLN
Freq: Once | INTRAVENOUS | Status: AC
  Filled 2020-09-01: qty 250

## 2020-09-01 MED ORDER — ACETAMINOPHEN 325 MG PO TABS
650.0000 mg | ORAL_TABLET | Freq: Once | ORAL | Status: AC
  Administered 2020-09-01: 650 mg via ORAL

## 2020-09-01 MED ORDER — SODIUM CHLORIDE 0.9 % IV SOLN
2.0000 mg/kg | Freq: Once | INTRAVENOUS | Status: AC
  Administered 2020-09-01: 155 mg via INTRAVENOUS
  Filled 2020-09-01: qty 3.1

## 2020-09-01 MED ORDER — ACETAMINOPHEN 325 MG PO TABS
ORAL_TABLET | ORAL | Status: AC
  Filled 2020-09-01: qty 2

## 2020-09-01 MED ORDER — PROCHLORPERAZINE MALEATE 10 MG PO TABS
10.0000 mg | ORAL_TABLET | Freq: Once | ORAL | Status: AC
  Administered 2020-09-01: 10 mg via ORAL

## 2020-09-01 MED ORDER — DIPHENHYDRAMINE HCL 25 MG PO CAPS
ORAL_CAPSULE | ORAL | Status: AC
  Filled 2020-09-01: qty 2

## 2020-09-01 MED ORDER — SODIUM CHLORIDE 0.9% FLUSH
10.0000 mL | INTRAVENOUS | Status: DC | PRN
Start: 2020-09-01 — End: 2020-09-01
  Administered 2020-09-01: 10 mL
  Filled 2020-09-01: qty 10

## 2020-09-01 NOTE — Patient Instructions (Signed)
Belantamab Mafodotin for Injection What is this medication? BELANTAMAB MAFODOTIN (bel an ta mab ma foe doe tin) is a monoclonal antibodyand a chemotherapy drug. It is used to treat multiple myeloma. This medicine may be used for other purposes; ask your health care provider orpharmacist if you have questions. COMMON BRAND NAME(S): BLENREP What should I tell my care team before I take this medication? They need to know if you have any of these conditions: bleeding disorders eye disease, vision problems an unusual or allergic reaction to belantamab mafodotin, other medicines, foods, dyes, or preservatives pregnant or trying to get pregnant breast-feeding How should I use this medication? This medicine is for infusion into a vein. It is given by a health careprofessional in a hospital or clinic setting. A special MedGuide will be given to you before each treatment. Be sure to readthis information carefully each time. Talk to your pediatrician regarding the use of this medicine in children.Special care may be needed. Overdosage: If you think you have taken too much of this medicine contact apoison control center or emergency room at once. NOTE: This medicine is only for you. Do not share this medicine with others. What if I miss a dose? It is important not to miss your dose. Call your doctor or health careprofessional if you are unable to keep an appointment. What may interact with this medication? Interactions have not been studied. Give your health care provider a list of all the medicines, herbs, non-prescription drugs, or dietary supplements you use. Also tell them if you smoke, drink alcohol, or use illegal drugs. Some items may interact with yourmedicine. This list may not describe all possible interactions. Give your health care provider a list of all the medicines, herbs, non-prescription drugs, or dietary supplements you use. Also tell them if you smoke, drink alcohol, or use  illegaldrugs. Some items may interact with your medicine. What should I watch for while using this medication? Your condition will be monitored carefully while you are receiving thismedicine. If you wear contact lenses, ask your health care professional when you can wearyour lenses again. Tell your health care professional right away if you have any change in youreyesight. Your vision may be tested before and during use of this medicine. This medicine may increase your risk to bruise or bleed. Call your health careprofessional if you notice any unusual bleeding. Do not become pregnant while taking this medicine or for 4 months after stopping it. Women should inform their doctor if they wish to become pregnant or think they might be pregnant. Men should not father a child while taking this medicine or for 6 months after stopping it. There is a potential for serious side effects to an unborn child. Talk to your health care professionalor pharmacist for more information. Do not breast-feed an infant while taking this medicine or for 3 months afterstopping it. This medicine may make it more difficult to get pregnant or father a child.Talk to your health care professional if you are concerned about your fertility. What side effects may I notice from receiving this medication? Side effects that you should report to your care team as soon as possible: allergic reactions (skin rash, itching or hives, swelling of the face, lips, or tongue) blurred vision changes in vision chest pain chills cough dizziness facial flushing fever palpitations shortness of breath unusual bruising or bleeding tiredness Side effects that usually do not require medical attention (report these toyour care team if they continue or are bothersome): back pain constipation  decreased appetite diarrhea joint pain nausea This list may not describe all possible side effects. Call your doctor for medical advice about side  effects. You may report side effects to FDA at1-800-FDA-1088. Where should I keep my medication? This drug is given in a hospital or clinic and will not be stored at home. NOTE: This sheet is a summary. It may not cover all possible information. If you have questions about this medicine, talk to your doctor, pharmacist, orhealth care provider.  2022 Elsevier/Gold Standard (2020-03-31 11:44:43)

## 2020-09-01 NOTE — Patient Instructions (Signed)

## 2020-09-01 NOTE — Progress Notes (Signed)
Hematology and Oncology Follow Up Visit  Emily Greer [id number] Feb 23, 1950 70 y.o. 09/01/2020   Principle Diagnosis:  Recurrent lambda light chain myeloma - nl cytogenetics History of recurrent endometrial carcinoma Iron deficiency anemia -blood loss   Past Therapy:             Status post second autologous stem cell transplant on 07/24/2014 Maintenance therapy with Pomalidomide/every 2 week Velcade - d/c'ed Radiation therapy for endometrial recurrence - completed 04/20/2015 Pomalyst/Kyprolis 70mg /m2 IV q 2 weeks - s/p cycle #12 - held on 12/26/2017 for hematuria Daratumumab/Pomalyst (1 mg) - status post cycle 19 -- d/c on 08/21/2019 Melflufen 40 mg IV q 4 weeks -- started on 08/27/2019, s/p cycle #2 --  D/c due to FDA removal Selinexor 60 mg po q week -- start on 01/19/2020 -- changed on 03/18/2020 -- d/c on 04/27/2020   Current Therapy:        Blenrep 2.5 mg/m2 IV q 3 week -- s/p cycle #3- start on 05/20/2020 Femara 2.5 mg po q day  Xgeva 120 mg subcu every 3 months - next dose in 10/2020  IV iron as indicated   Interim History:  Emily Greer is here today for follow-up and treatment. She notes that Emily Greer has helped significantly with her dry eyes. She is followed closely by ophthalmologist Dr. Jimmey Ralph. She will eventually need cataract surgery on her left eye. She has some mild light sensitivity at times.   No episodes of bleeding. No bruising or petechiae.  In July, no M-spike detected, IgG level 366 mg/dL and lambda light chains 5.33 mg/dL.  Hgb 9.3, MCV 81, WBC count 4.0 and platelets 147.  No fever, chills, n/v, cough, rash, dizziness, headaches, SOB, chest pain, palpitations, abdominal pain or changes in bowel or bladder habits.  She has occasional episodes of diarrhea due to certain foods which she tries to avoid.  No swelling, tenderness, numbness or tingling in her extremities at this time. She states that her diuretic has really helped reduce her fluid retention  preventing swelling in her feet and ankles.  No falls or syncope to report.  She feels that her appetite is good and is staying well hydrated. Her weight is stable at 172 lbs.    ECOG Performance Status: 1 - Symptomatic but completely ambulatory  Medications:  Allergies as of 09/01/2020       Reactions   Codeine Nausea Only        Medication List        Accurate as of September 01, 2020 11:41 AM. If you have any questions, ask your nurse or doctor.          STOP taking these medications    benzonatate 100 MG capsule Commonly known as: Lawyer Stopped by: Emeline Gins, NP   penicillin v potassium 500 MG tablet Commonly known as: VEETID Stopped by: Emeline Gins, NP       TAKE these medications    acyclovir 400 MG tablet Commonly known as: ZOVIRAX TAKE 1 TABLET(400 MG) BY MOUTH TWICE DAILY   albuterol 108 (90 Base) MCG/ACT inhaler Commonly known as: VENTOLIN HFA Inhale 2 puffs into the lungs every 6 (six) hours as needed for wheezing or shortness of breath. 2 puffs 3 times daily x 5 days then every 6 hours as needed.   amLODipine 10 MG tablet Commonly known as: NORVASC TAKE 1 TABLET(10 MG) BY MOUTH EVERY MORNING   amoxicillin-clavulanate 875-125 MG tablet Commonly known as: AUGMENTIN   aspirin EC 81  MG tablet Take 81 mg by mouth daily.   CALCIUM 1200+D3 PO Take 1 tablet by mouth daily.   Carboxymethylcell-Glycerin PF 0.5-0.9 % Soln Place 2 drops into both eyes 4 (four) times daily.   cetirizine 10 MG tablet Commonly known as: ZYRTEC Take 1 tablet (10 mg total) by mouth at bedtime.   Darzalex 100 MG/5ML Generic drug: daratumumab   Fluocinolone Acetonide Scalp 0.01 % Oil Apply topically.   hydrochlorothiazide 12.5 MG capsule Commonly known as: MICROZIDE Take 12.5 mg by mouth every morning.   letrozole 2.5 MG tablet Commonly known as: FEMARA Take 1 tablet (2.5 mg total) by mouth daily.   lidocaine-prilocaine cream Commonly known  as: EMLA Apply 1 application topically as needed.   loperamide 2 MG capsule Commonly known as: IMODIUM Take by mouth as needed for diarrhea or loose stools. Reported on 08/19/2015   loratadine 10 MG tablet Commonly known as: CLARITIN Take 10 mg by mouth every morning. Reported on 08/19/2015   metoprolol succinate 50 MG 24 hr tablet Commonly known as: TOPROL-XL TAKE 1 TABLET(50 MG) BY MOUTH EVERY MORNING   Minoxidil for Men 5 % Soln Generic drug: MINOXIDIL (TOPICAL) SMARTSIG:Sparingly Topical Every Night   Rogaine Mens 5 % Foam Generic drug: Minoxidil See admin instructions.   montelukast 10 MG tablet Commonly known as: SINGULAIR TAKE 1 TABLET(10 MG) BY MOUTH AT BEDTIME   ondansetron 8 MG tablet Commonly known as: Zofran Take 1 tablet (8 mg total) by mouth 2 (two) times daily as needed (Nausea or vomiting).   potassium chloride SA 20 MEQ tablet Commonly known as: KLOR-CON TAKE 1 TABLET(20 MEQ) BY MOUTH TWICE DAILY   PROBIOTIC DAILY PO Take by mouth daily.   triamcinolone 55 MCG/ACT Aero nasal inhaler Commonly known as: NASACORT Place 2 sprays into the nose as needed.   triamterene-hydrochlorothiazide 37.5-25 MG tablet Commonly known as: MAXZIDE-25 1 tablet by mouth at bedtime   Vitamin D3 50 MCG (2000 UT) Tabs Take 2 tablets by mouth daily.   Xiidra 5 % Soln Generic drug: Lifitegrast   Zinc 50 MG Tabs 1 tablet   zinc gluconate 50 MG tablet Take 50 mg by mouth daily.        Allergies:  Allergies  Allergen Reactions   Codeine Nausea Only    Past Medical History, Surgical history, Social history, and Family History were reviewed and updated.  Review of Systems: All other 10 point review of systems is negative.   Physical Exam:  height is 5\' 2"  (1.575 m) and weight is 172 lb (78 kg). Her oral temperature is 99.2 F (37.3 C). Her blood pressure is 147/82 (abnormal) and her pulse is 77. Her respiration is 17 and oxygen saturation is 98%.   Wt  Readings from Last 3 Encounters:  09/01/20 172 lb (78 kg)  08/11/20 178 lb (80.7 kg)  08/10/20 173 lb 12.8 oz (78.8 kg)    Ocular: Sclerae unicteric, pupils equal, round and reactive to light Ear-nose-throat: Oropharynx clear, dentition fair Lymphatic: No cervical or supraclavicular adenopathy Lungs no rales or rhonchi, good excursion bilaterally Heart regular rate and rhythm, no murmur appreciated Abd soft, nontender, positive bowel sounds MSK no focal spinal tenderness, no joint edema Neuro: non-focal, well-oriented, appropriate affect Breasts: Deferred   Lab Results  Component Value Date   WBC 4.0 08/11/2020   HGB 9.3 (L) 08/11/2020   HCT 29.6 (L) 08/11/2020   MCV 81.5 08/11/2020   PLT 147 (L) 08/11/2020   Lab Results  Component Value  Date   FERRITIN 2,472 (H) 04/27/2020   IRON 98 04/27/2020   TIBC 361 04/27/2020   UIBC 263 04/27/2020   IRONPCTSAT 27 04/27/2020   Lab Results  Component Value Date   RETICCTPCT 1.6 11/05/2019   RBC 3.63 (L) 08/11/2020   Lab Results  Component Value Date   KPAFRELGTCHN 3.3 08/11/2020   LAMBDASER 58.8 (H) 08/11/2020   KAPLAMBRATIO 0.06 (L) 08/11/2020   Lab Results  Component Value Date   IGGSERUM 366 (L) 08/11/2020   IGA 9 (L) 08/11/2020   IGMSERUM <5 (L) 08/11/2020   Lab Results  Component Value Date   TOTALPROTELP 6.0 08/11/2020   ALBUMINELP 3.5 08/11/2020   A1GS 0.2 08/11/2020   A2GS 0.9 08/11/2020   BETS 1.0 08/11/2020   BETA2SER 0.4 11/23/2014   GAMS 0.3 (L) 08/11/2020   MSPIKE Not Observed 08/11/2020   SPEI Comment 05/20/2020     Chemistry      Component Value Date/Time   NA 141 08/11/2020 0828   NA 141 01/10/2017 1115   NA 140 06/21/2016 0918   K 3.9 08/11/2020 0828   K 4.0 01/10/2017 1115   K 4.3 06/21/2016 0918   CL 107 08/11/2020 0828   CL 106 01/10/2017 1115   CO2 28 08/11/2020 0828   CO2 27 01/10/2017 1115   CO2 20 (L) 06/21/2016 0918   BUN 12 08/11/2020 0828   BUN 15 01/10/2017 1115   BUN 15.8  06/21/2016 0918   CREATININE 0.79 08/11/2020 0828   CREATININE 1.0 01/10/2017 1115   CREATININE 0.8 06/21/2016 0918      Component Value Date/Time   CALCIUM 9.9 08/11/2020 0828   CALCIUM 9.5 01/10/2017 1115   CALCIUM 9.4 06/21/2016 0918   ALKPHOS 56 08/11/2020 0828   ALKPHOS 40 01/10/2017 1115   ALKPHOS 66 06/21/2016 0918   AST 18 08/11/2020 0828   AST 17 06/21/2016 0918   ALT 17 08/11/2020 0828   ALT 19 01/10/2017 1115   ALT 37 06/21/2016 0918   BILITOT 0.3 08/11/2020 0828   BILITOT 0.32 06/21/2016 0918       Impression and Plan: Ms. Hanke is a very pleasant 70 yo African American female with recurrent lambda light chain myeloma. Her second stem cell transplant for light chain myeloma was in June 2016.  She also has localized recurrent endometrial cancer (followed by gyn onc Dr. Nelly Rout) and is currently on Femara. We will proceed with Blenrep treatment today as planned per MD.  Hgb improved from 8.4 to 9.3.  Follow-up in 3 weeks.  She can contact our office with any questions or concerns.   Emeline Gins, NP 8/3/202211:41 AM

## 2020-09-02 LAB — KAPPA/LAMBDA LIGHT CHAINS
Kappa free light chain: 3.8 mg/L (ref 3.3–19.4)
Kappa, lambda light chain ratio: 0.07 — ABNORMAL LOW (ref 0.26–1.65)
Lambda free light chains: 53.2 mg/L — ABNORMAL HIGH (ref 5.7–26.3)

## 2020-09-02 LAB — IGG, IGA, IGM
IgA: 8 mg/dL — ABNORMAL LOW (ref 87–352)
IgG (Immunoglobin G), Serum: 369 mg/dL — ABNORMAL LOW (ref 586–1602)
IgM (Immunoglobulin M), Srm: <5 mg/dL — ABNORMAL LOW (ref 26–217)

## 2020-09-03 LAB — PROTEIN ELECTROPHORESIS, SERUM, WITH REFLEX
A/G Ratio: 1.6 (ref 0.7–1.7)
Albumin ELP: 3.6 g/dL (ref 2.9–4.4)
Alpha-1-Globulin: 0.2 g/dL (ref 0.0–0.4)
Alpha-2-Globulin: 0.9 g/dL (ref 0.4–1.0)
Beta Globulin: 0.9 g/dL (ref 0.7–1.3)
Gamma Globulin: 0.2 g/dL — ABNORMAL LOW (ref 0.4–1.8)
Globulin, Total: 2.3 g/dL (ref 2.2–3.9)
Total Protein ELP: 5.9 g/dL — ABNORMAL LOW (ref 6.0–8.5)

## 2020-09-10 ENCOUNTER — Telehealth: Payer: Self-pay | Admitting: *Deleted

## 2020-09-10 DIAGNOSIS — H04223 Epiphora due to insufficient drainage, bilateral lacrimal glands: Secondary | ICD-10-CM | POA: Diagnosis not present

## 2020-09-10 DIAGNOSIS — H35033 Hypertensive retinopathy, bilateral: Secondary | ICD-10-CM | POA: Diagnosis not present

## 2020-09-10 DIAGNOSIS — H25813 Combined forms of age-related cataract, bilateral: Secondary | ICD-10-CM | POA: Diagnosis not present

## 2020-09-10 DIAGNOSIS — H16143 Punctate keratitis, bilateral: Secondary | ICD-10-CM | POA: Diagnosis not present

## 2020-09-10 NOTE — Telephone Encounter (Signed)
Patient status form and authorization number generated for upcoming Blenrep infusion

## 2020-09-20 DIAGNOSIS — H16223 Keratoconjunctivitis sicca, not specified as Sjogren's, bilateral: Secondary | ICD-10-CM | POA: Diagnosis not present

## 2020-09-20 DIAGNOSIS — H25813 Combined forms of age-related cataract, bilateral: Secondary | ICD-10-CM | POA: Diagnosis not present

## 2020-09-20 DIAGNOSIS — Z79899 Other long term (current) drug therapy: Secondary | ICD-10-CM | POA: Diagnosis not present

## 2020-09-20 DIAGNOSIS — H40033 Anatomical narrow angle, bilateral: Secondary | ICD-10-CM | POA: Diagnosis not present

## 2020-09-22 ENCOUNTER — Ambulatory Visit (INDEPENDENT_AMBULATORY_CARE_PROVIDER_SITE_OTHER): Payer: Medicare Other | Admitting: Nurse Practitioner

## 2020-09-22 ENCOUNTER — Encounter: Payer: Self-pay | Admitting: Nurse Practitioner

## 2020-09-22 ENCOUNTER — Other Ambulatory Visit: Payer: Self-pay

## 2020-09-22 VITALS — BP 134/70 | HR 74

## 2020-09-22 DIAGNOSIS — R0981 Nasal congestion: Secondary | ICD-10-CM | POA: Diagnosis not present

## 2020-09-22 DIAGNOSIS — R059 Cough, unspecified: Secondary | ICD-10-CM | POA: Diagnosis not present

## 2020-09-22 DIAGNOSIS — R067 Sneezing: Secondary | ICD-10-CM

## 2020-09-22 DIAGNOSIS — R0989 Other specified symptoms and signs involving the circulatory and respiratory systems: Secondary | ICD-10-CM | POA: Diagnosis not present

## 2020-09-22 LAB — POC COVID19 BINAXNOW: SARS Coronavirus 2 Ag: NEGATIVE

## 2020-09-22 MED ORDER — BENZONATATE 100 MG PO CAPS
100.0000 mg | ORAL_CAPSULE | Freq: Three times a day (TID) | ORAL | 1 refills | Status: DC | PRN
Start: 2020-09-22 — End: 2020-12-22

## 2020-09-22 MED ORDER — GUAIFENESIN-DM 100-10 MG/5ML PO SYRP: 5.0000 mL | ORAL_SOLUTION | ORAL | 0 refills | Status: DC | PRN

## 2020-09-22 MED ORDER — AMOXICILLIN-POT CLAVULANATE 875-125 MG PO TABS: 1.0000 | ORAL_TABLET | Freq: Two times a day (BID) | ORAL | 0 refills | Status: AC

## 2020-09-22 NOTE — Progress Notes (Signed)
I,Yamilka Roman [employer] as a Neurosurgeon for [employer], NP.,have documented all relevant documentation on the behalf of [employer], NP,as directed by  Charlesetta Ivory, NP while in the presence of Charlesetta Ivory, NP.  This visit occurred during the SARS-CoV-2 public health emergency.  Safety protocols were in place, including screening questions prior to the visit, additional usage of staff PPE, and extensive cleaning of exam room while observing appropriate contact time as indicated for disinfecting solutions.  Subjective:     Patient ID: Emily Greer , female    DOB: Dec 07, 1950 , 70 y.o.   MRN: [id number]   Chief Complaint  Patient presents with   URI    Patient has been sneezing and runny nose and coughing on occasion. Her symptoms started on Sunday.     HPI  Patient is here with slight cough,sinus congestion, runny nose and sneezing. No fever, no SOB no chest pain. She is vaccinated and double boosted. Does not recall being around anyone with covid.   URI  This is a new problem. The current episode started in the past 7 days. The problem has been unchanged. There has been no fever. Associated symptoms include congestion, coughing, rhinorrhea and sneezing. Pertinent negatives include no chest pain, diarrhea, headaches, nausea or wheezing. She has tried nothing for the symptoms. The treatment provided no relief.    Past Medical History:  Diagnosis Date   Avascular necrosis of femoral head (HCC)    bilateral per CT 07-26-2015   Endometrial carcinoma [facility]) gyn oncologist-  dr Nelly Rout ([facility])/  radiation oncologist-- dr Roselind Messier   2013 dx  FIGO Stage 1A, Grade 2 endometrioid endometrial cancer s/p TAH w/ BSO and bilateral pelvic node dissection 10-31-2011 ;  recurrence at distal vagina 04/ 2014 s/p  brachytherapy (ended 07-29-2012);  2nd recurrence 12/ 2016  vaginal apex s/p  conformational radiotherapy 03-10-2015 to 04-20-2015   Family history of  adverse reaction to anesthesia    mother ponv   GERD (gastroesophageal reflux disease)    Goals of care, counseling/discussion 08/21/2019   H/O stem cell transplant (HCC)    02/ 2000 and second one 06/ 2016   History of bacteremia    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   History of chemotherapy    last chemo 12-26-17   History of radiation therapy 6/4, 6/11, 6/19, 6/25, 07/29/2012   vagina 30.5 gray in 5 fx, HDR brachytherapy:   last radiation to vagina 03-10-2015 to 04-20-2015  50.4gray   History of radiation therapy 03/10/15-04/20/15   vagina 50.4 in 28 fractions   Hypertension    Lambda light chain myeloma [facility]) oncologist-  dr Myna Hidalgo ([facility])  and  [facility] -- dr Silvestre Mesi gasparetto   dx 07/ 1999 s/p  VAD chemotherapy 11/ 1999,  purged autotransplant 03-21-1998 followed by auto stem cell transplant 03-29-1998;  recurrance w/ second autologous stem cell transplant 07-24-2014;  in Re-mission currently , chemo maintenance therapy   Osteoporosis 12/18/05   Increased  risk    PONV (postoperative nausea and vomiting)    Wears glasses      Family History  Problem Relation Age of Onset   Colon cancer Mother    Hypertension Father    Heart Problems Father      Current Outpatient Medications:    acyclovir (ZOVIRAX) 400 MG tablet, TAKE 1 TABLET(400 MG) BY MOUTH TWICE DAILY, Disp: 60 tablet, Rfl: 1   albuterol (VENTOLIN HFA) 108 (90 Base) MCG/ACT inhaler, Inhale 2  puffs into the lungs every 6 (six) hours as needed for wheezing or shortness of breath. 2 puffs 3 times daily x 5 days then every 6 hours as needed., Disp: 18 g, Rfl: 11   amLODipine (NORVASC) 10 MG tablet, TAKE 1 TABLET(10 MG) BY MOUTH EVERY MORNING, Disp: 90 tablet, Rfl: 2   amoxicillin-clavulanate (AUGMENTIN) 875-125 MG tablet, Take 1 tablet by mouth 2 (two) times daily for 7 days., Disp: 14 tablet, Rfl: 0   benzonatate (TESSALON PERLES) 100 MG capsule, Take 1 capsule (100 mg total) by mouth 3 (three)  times daily as needed for cough., Disp: 30 capsule, Rfl: 1   Carboxymethylcell-Glycerin PF 0.5-0.9 % SOLN, Place 2 drops into both eyes 4 (four) times daily., Disp: 1 each, Rfl: 11   cetirizine (ZYRTEC) 10 MG tablet, Take 1 tablet (10 mg total) by mouth at bedtime., Disp: 90 tablet, Rfl: 1   daratumumab (DARZALEX) 100 MG/5ML, , Disp: , Rfl:    Fluocinolone Acetonide Scalp 0.01 % OIL, Apply topically., Disp: , Rfl:    guaiFENesin-dextromethorphan (ROBITUSSIN DM) 100-10 MG/5ML syrup, Take 5 mLs by mouth every 4 (four) hours as needed for cough., Disp: 118 mL, Rfl: 0   hydrochlorothiazide (MICROZIDE) 12.5 MG capsule, Take 12.5 mg by mouth every morning., Disp: , Rfl:    letrozole (FEMARA) 2.5 MG tablet, Take 1 tablet (2.5 mg total) by mouth daily., Disp: 60 tablet, Rfl: 4   lidocaine-prilocaine (EMLA) cream, Apply 1 application topically as needed., Disp: 30 g, Rfl: 3   loratadine (CLARITIN) 10 MG tablet, Take 10 mg by mouth every morning. Reported on 08/19/2015, Disp: , Rfl:    metoprolol succinate (TOPROL-XL) 50 MG 24 hr tablet, TAKE 1 TABLET(50 MG) BY MOUTH EVERY MORNING, Disp: 90 tablet, Rfl: 3   Minoxidil (ROGAINE MENS) 5 % FOAM, See admin instructions., Disp: , Rfl:    MINOXIDIL FOR MEN 5 % SOLN, SMARTSIG:Sparingly Topical Every Night, Disp: , Rfl:    montelukast (SINGULAIR) 10 MG tablet, TAKE 1 TABLET(10 MG) BY MOUTH AT BEDTIME, Disp: 90 tablet, Rfl: 2   ondansetron (ZOFRAN) 8 MG tablet, Take 1 tablet (8 mg total) by mouth 2 (two) times daily as needed (Nausea or vomiting)., Disp: 30 tablet, Rfl: 1   potassium chloride SA (KLOR-CON) 20 MEQ tablet, TAKE 1 TABLET(20 MEQ) BY MOUTH TWICE DAILY, Disp: 60 tablet, Rfl: 3   Probiotic Product (PROBIOTIC DAILY PO), Take by mouth daily., Disp: , Rfl:    triamcinolone (NASACORT) 55 MCG/ACT AERO nasal inhaler, Place 2 sprays into the nose as needed. , Disp: , Rfl:    triamterene-hydrochlorothiazide (MAXZIDE-25) 37.5-25 MG tablet, 1 tablet by mouth at  bedtime, Disp: 90 tablet, Rfl: 2   XIIDRA 5 % SOLN, , Disp: , Rfl:    Zinc 50 MG TABS, 1 tablet, Disp: , Rfl:    zinc gluconate 50 MG tablet, Take 50 mg by mouth daily., Disp: , Rfl:    aspirin EC 81 MG tablet, Take 81 mg by mouth daily. (Patient not taking: No sig reported), Disp: , Rfl:    Calcium-Magnesium-Vitamin D (CALCIUM 1200+D3 PO), Take 1 tablet by mouth daily. (Patient not taking: No sig reported), Disp: , Rfl:    Cholecalciferol (VITAMIN D3) 2000 UNITS TABS, Take 2 tablets by mouth daily.  (Patient not taking: No sig reported), Disp: , Rfl:    loperamide (IMODIUM) 2 MG capsule, Take by mouth as needed for diarrhea or loose stools. Reported on 08/19/2015 (Patient not taking: No sig reported), Disp: ,  Rfl:  No current facility-administered medications for this visit.  Facility-Administered Medications Ordered in Other Visits:    0.9 %  sodium chloride infusion, , Intravenous, Once PRN, Ennever, Rose Phi, MD   albuterol (PROVENTIL) (2.5 MG/3ML) 0.083% nebulizer solution 2.5 mg, 2.5 mg, Nebulization, Once PRN, Ennever, Rose Phi, MD   sodium chloride flush (NS) 0.9 % injection 10 mL, 10 mL, Intravenous, PRN, [place], Sarah M, NP, 10 mL at 12/26/17 1130   sodium chloride flush (NS) 0.9 % injection 10 mL, 10 mL, Intravenous, PRN, Josph Macho, MD, 10 mL at 04/24/19 1305   sodium chloride flush (NS) 0.9 % injection 10 mL, 10 mL, Intravenous, PRN, Josph Macho, MD, 10 mL at 08/21/19 1233   sodium chloride flush (NS) 0.9 % injection 10 mL, 10 mL, Intracatheter, PRN, [place], Sarah M, NP, 10 mL at 10/01/19 1053   sodium chloride flush (NS) 0.9 % injection 10 mL, 10 mL, Intracatheter, PRN, [place], Sarah M, NP, 10 mL at 12/11/19 1414   Allergies  Allergen Reactions   Codeine Nausea Only     Review of Systems  Constitutional:  Negative for chills, fatigue and fever.  HENT:  Positive for congestion, rhinorrhea and sneezing.   Respiratory:  Positive for cough. Negative for  shortness of breath and wheezing.   Cardiovascular:  Negative for chest pain and palpitations.  Gastrointestinal:  Negative for constipation, diarrhea and nausea.  Musculoskeletal:  Negative for arthralgias and myalgias.  Neurological:  Negative for weakness and headaches.    Today's Vitals   09/22/20 1415  BP: 134/70  Pulse: 74   There is no height or weight on file to calculate BMI.   Objective:  Physical Exam Constitutional:      General: She is not in acute distress.    Appearance: Normal appearance.  HENT:     Head: Normocephalic and atraumatic.     Nose: No congestion or rhinorrhea.  Cardiovascular:     Rate and Rhythm: Normal rate and regular rhythm.     Pulses: Normal pulses.     Heart sounds: Normal heart sounds. No murmur heard. Pulmonary:     Effort: Pulmonary effort is normal. No respiratory distress.     Breath sounds: Normal breath sounds. No wheezing.  Skin:    General: Skin is warm and dry.     Capillary Refill: Capillary refill takes less than 2 seconds.  Neurological:     Mental Status: She is alert.        Assessment And Plan:     1. Sneezing - POC COVID-19- neg will send for PCR  - Novel Coronavirus, NAA ([facility])  2. Runny nose - POC COVID-19- neg will send for PCR  - Novel Coronavirus, NAA ([facility])  3. Sinus congestion - amoxicillin-clavulanate (AUGMENTIN) 875-125 MG tablet; Take 1 tablet by mouth 2 (two) times daily for 7 days.  Dispense: 14 tablet; Refill: 0 - Novel Coronavirus, NAA ([facility])  4. Cough - POC COVID-19 - guaiFENesin-dextromethorphan (ROBITUSSIN DM) 100-10 MG/5ML syrup; Take 5 mLs by mouth every 4 (four) hours as needed for cough.  Dispense: 118 mL; Refill: 0 - benzonatate (TESSALON PERLES) 100 MG capsule; Take 1 capsule (100 mg total) by mouth 3 (three) times daily as needed for cough.  Dispense: 30 capsule; Refill: 1 - Novel Coronavirus, NAA ([facility])   The patient was encouraged to call or send a message through MyChart  for any questions or concerns.   Follow up: if symptoms persist or do not  get better.   Side effects and appropriate use of all the medication(s) were discussed with the patient today. Patient advised to use the medication(s) as directed by their healthcare provider. The patient was encouraged to read, review, and understand all associated package inserts and contact our office with any questions or concerns. The patient accepts the risks of the treatment plan and had an opportunity to ask questions.   Advised patient to take Vitamin C, D, Zinc.  Keep yourself hydrated with a lot of water and rest. Take Delsym for cough and Mucinex as need. Take Tylenol or pain reliever every 4-6 hours as needed for pain/fever/body ache. If you have elevated blood pressure, you can take OTC Corcidin. You can also take OTC oscillococcinum to help with your symptoms.  Educated patient if symptoms get worse or if she experiences any SOB, chest pain or pain in her legs to seek immediate emergency care. Continue to monitor your oxygen levels. Call us if you have any questions. Quarantine for 5 days if tested positive and no symptoms or 10 days if tested positive and have symptoms. Wear a mask around other people.   Staying healthy and adopting a healthy lifestyle for your overall health is important. You should eat 7 or more servings of fruits and vegetables per day. You should drink plenty of water to keep yourself hydrated and your kidneys healthy. This includes about 65-80+ fluid ounces of water. Limit your intake of animal fats especially for elevated cholesterol. Avoid highly processed food and limit your salt intake if you have hypertension. Avoid foods high in saturated/Trans fats. Along with a healthy diet it is also very important to maintain time for yourself to maintain a healthy mental health with low stress levels. You should get atleast 150 min of moderate intensity exercise weekly for a healthy heart. Along with  eating right and exercising, aim for at least 7-9 hours of sleep daily.  Eat more whole grains which includes barley, wheat berries, oats, brown rice and whole wheat pasta. Use healthy plant oils which include olive, soy, corn, sunflower and peanut. Limit your caffeine and sugary drinks. Limit your intake of fast foods. Limit milk and dairy products to one or two daily servings.   Patient was given opportunity to ask questions. Patient verbalized understanding of the plan and was able to repeat key elements of the plan. All questions were answered to their satisfaction.  Raman Lavalle Skoda, DNP   I, Raman Nissim Fleischer have reviewed all documentation for this visit. The documentation on 09/22/20 for the exam, diagnosis, procedures, and orders are all accurate and complete.    IF YOU HAVE BEEN REFERRED TO A SPECIALIST, IT MAY TAKE 1-2 WEEKS TO SCHEDULE/PROCESS THE REFERRAL. IF YOU HAVE NOT HEARD FROM US/SPECIALIST IN TWO WEEKS, PLEASE GIVE [place] A CALL AT [phone].   THE PATIENT IS ENCOURAGED TO PRACTICE SOCIAL DISTANCING DUE TO THE COVID-19 PANDEMIC.

## 2020-09-23 LAB — SARS-COV-2, NAA 2 DAY TAT

## 2020-09-23 LAB — NOVEL CORONAVIRUS, NAA: SARS-CoV-2, NAA: NOT DETECTED

## 2020-09-24 ENCOUNTER — Telehealth: Payer: Self-pay | Admitting: *Deleted

## 2020-09-24 ENCOUNTER — Inpatient Hospital Stay: Payer: Medicare Other

## 2020-09-24 ENCOUNTER — Other Ambulatory Visit: Payer: Self-pay

## 2020-09-24 ENCOUNTER — Encounter: Payer: Self-pay | Admitting: Family

## 2020-09-24 ENCOUNTER — Inpatient Hospital Stay (HOSPITAL_BASED_OUTPATIENT_CLINIC_OR_DEPARTMENT_OTHER): Payer: Medicare Other | Admitting: Family

## 2020-09-24 VITALS — BP 132/77 | HR 81 | Temp 98.3°F | Resp 18 | Ht 62.0 in | Wt 168.1 lb

## 2020-09-24 DIAGNOSIS — C9000 Multiple myeloma not having achieved remission: Secondary | ICD-10-CM | POA: Diagnosis not present

## 2020-09-24 DIAGNOSIS — C541 Malignant neoplasm of endometrium: Secondary | ICD-10-CM

## 2020-09-24 DIAGNOSIS — R197 Diarrhea, unspecified: Secondary | ICD-10-CM | POA: Diagnosis not present

## 2020-09-24 DIAGNOSIS — R58 Hemorrhage, not elsewhere classified: Secondary | ICD-10-CM | POA: Diagnosis not present

## 2020-09-24 DIAGNOSIS — D500 Iron deficiency anemia secondary to blood loss (chronic): Secondary | ICD-10-CM

## 2020-09-24 DIAGNOSIS — Z95828 Presence of other vascular implants and grafts: Secondary | ICD-10-CM

## 2020-09-24 DIAGNOSIS — Z5112 Encounter for antineoplastic immunotherapy: Secondary | ICD-10-CM | POA: Diagnosis not present

## 2020-09-24 LAB — CBC WITH DIFFERENTIAL (CANCER CENTER ONLY)
Abs Immature Granulocytes: 0.01 10*3/uL (ref 0.00–0.07)
Basophils Absolute: 0.1 10*3/uL (ref 0.0–0.1)
Basophils Relative: 1 %
Eosinophils Absolute: 0.2 10*3/uL (ref 0.0–0.5)
Eosinophils Relative: 4 %
HCT: 31 % — ABNORMAL LOW (ref 36.0–46.0)
Hemoglobin: 9.8 g/dL — ABNORMAL LOW (ref 12.0–15.0)
Immature Granulocytes: 0 %
Lymphocytes Relative: 15 %
Lymphs Abs: 0.7 10*3/uL (ref 0.7–4.0)
MCH: 24 pg — ABNORMAL LOW (ref 26.0–34.0)
MCHC: 31.6 g/dL (ref 30.0–36.0)
MCV: 75.8 fL — ABNORMAL LOW (ref 80.0–100.0)
Monocytes Absolute: 0.5 10*3/uL (ref 0.1–1.0)
Monocytes Relative: 11 %
Neutro Abs: 3.3 10*3/uL (ref 1.7–7.7)
Neutrophils Relative %: 69 %
Platelet Count: 140 10*3/uL — ABNORMAL LOW (ref 150–400)
RBC: 4.09 MIL/uL (ref 3.87–5.11)
RDW: 19.7 % — ABNORMAL HIGH (ref 11.5–15.5)
WBC Count: 4.8 10*3/uL (ref 4.0–10.5)
nRBC: 0 % (ref 0.0–0.2)

## 2020-09-24 LAB — CMP (CANCER CENTER ONLY)
ALT: 18 U/L (ref 0–44)
AST: 21 U/L (ref 15–41)
Albumin: 3.8 g/dL (ref 3.5–5.0)
Alkaline Phosphatase: 59 U/L (ref 38–126)
Anion gap: 8 (ref 5–15)
BUN: 11 mg/dL (ref 8–23)
CO2: 29 mmol/L (ref 22–32)
Calcium: 9.5 mg/dL (ref 8.9–10.3)
Chloride: 102 mmol/L (ref 98–111)
Creatinine: 0.71 mg/dL (ref 0.44–1.00)
GFR, Estimated: >60 mL/min (ref >60)
Glucose, Bld: 97 mg/dL (ref 70–99)
Potassium: 3.5 mmol/L (ref 3.5–5.1)
Sodium: 139 mmol/L (ref 135–145)
Total Bilirubin: 0.3 mg/dL (ref 0.3–1.2)
Total Protein: 6.2 g/dL — ABNORMAL LOW (ref 6.5–8.1)

## 2020-09-24 LAB — LACTATE DEHYDROGENASE: LDH: 312 U/L — ABNORMAL HIGH (ref 98–192)

## 2020-09-24 MED ORDER — PROCHLORPERAZINE MALEATE 10 MG PO TABS
10.0000 mg | ORAL_TABLET | Freq: Once | ORAL | Status: AC
  Administered 2020-09-24: 10 mg via ORAL
  Filled 2020-09-24: qty 1

## 2020-09-24 MED ORDER — DIPHENHYDRAMINE HCL 25 MG PO CAPS
50.0000 mg | ORAL_CAPSULE | Freq: Once | ORAL | Status: AC
  Administered 2020-09-24: 50 mg via ORAL
  Filled 2020-09-24: qty 2

## 2020-09-24 MED ORDER — HEPARIN SOD (PORK) LOCK FLUSH 100 UNIT/ML IV SOLN
500.0000 [IU] | Freq: Once | INTRAVENOUS | Status: AC | PRN
  Administered 2020-09-24: 500 [IU]

## 2020-09-24 MED ORDER — SODIUM CHLORIDE 0.9 % IV SOLN: Freq: Once | INTRAVENOUS | Status: AC

## 2020-09-24 MED ORDER — ACETAMINOPHEN 325 MG PO TABS
650.0000 mg | ORAL_TABLET | Freq: Once | ORAL | Status: AC
  Administered 2020-09-24: 650 mg via ORAL
  Filled 2020-09-24: qty 2

## 2020-09-24 MED ORDER — SODIUM CHLORIDE 0.9% FLUSH
10.0000 mL | Freq: Once | INTRAVENOUS | Status: AC
  Administered 2020-09-24: 10 mL via INTRAVENOUS

## 2020-09-24 MED ORDER — SODIUM CHLORIDE 0.9 % IV SOLN
2.0000 mg/kg | Freq: Once | INTRAVENOUS | Status: AC
  Administered 2020-09-24: 155 mg via INTRAVENOUS
  Filled 2020-09-24: qty 3.1

## 2020-09-24 MED ORDER — SODIUM CHLORIDE 0.9% FLUSH
10.0000 mL | INTRAVENOUS | Status: DC | PRN
  Administered 2020-09-24: 10 mL

## 2020-09-24 NOTE — Telephone Encounter (Signed)
Per 09/24/20 los - gave upcoming appointments - confirmed - print calendar

## 2020-09-24 NOTE — Progress Notes (Signed)
Dose of Blenrep submitted via [facility] website.

## 2020-09-24 NOTE — Patient Instructions (Signed)
Tunneled Central Venous Catheter Flushing Guide It is important to flush your tunneled central venous catheter each time you use it, both before and after you use it. Flushing your catheter will help prevent it from clogging. What are the risks? Risks may include: Infection. Air getting into the catheter and bloodstream. Supplies needed: A clean pair of gloves. A disinfecting wipe. Use an alcohol wipe, chlorhexidine wipe, or iodine wipe as told by your health care provider. A 10 mL syringe that has been prefilled with saline solution. An empty 10 mL syringe, if a substance called heparin was injected into your catheter. How to flush your catheter When you flush your catheter, make sure you follow any specific instructions from your health care provider or the manufacturer. These are general guidelines. Flushing your catheter before use If there is heparin in your catheter: Wash your hands with soap and water. Put on gloves. Scrub the injection cap for a minimum of 15 seconds with a disinfecting wipe. Unclamp the catheter. Attach the empty syringe to the injection cap. Pull the syringe plunger back and withdraw 10 mL of blood. Place the syringe into an appropriate waste container. Scrub the injection cap for 15 seconds with a disinfecting wipe. Attach the prefilled syringe to the injection cap. Flush the catheter by pushing the plunger forward until all the liquid from the syringe is in the catheter. Remove the syringe from the injection cap. Clamp the catheter. If there is no heparin in your catheter: Wash your hands with soap and water. Put on gloves. Scrub the injection cap for 15 seconds with a disinfecting wipe. Unclamp the catheter. Attach the prefilled syringe to the injection cap. Flush the catheter by pushing the plunger forward until 5 mL of the liquid from the syringe is in the catheter. Pull back on the syringe until you see blood in the catheter. If you have been asked  to collect any blood, follow your health care provider's instructions. Otherwise, flush the catheter with the rest of the solution from the syringe. Remove the syringe from the injection cap. Clamp the catheter.  Flushing your catheter after use Wash your hands with soap and water. Put on gloves. Scrub the injection cap for 15 seconds with a disinfecting wipe. Unclamp the catheter. Attach the prefilled syringe to the injection cap. Flush the catheter by pushing the plunger forward until all of the liquid from the syringe is in the catheter. Remove the syringe from the injection cap. Clamp the catheter. Problems and solutions If blood cannot be completely cleared from the injection cap, you may need to have the injection cap replaced. If the catheter is difficult to flush, use the pulsing method. The pulsing method involves pushing only a few milliliters of solution into the catheter at a time and pausing between pushes. If you do not see blood in the catheter when you pull back on the syringe, change your body position, such as by raising your arms above your head. Take a deep breath and cough. Then, pull back on the syringe. If you still do not see blood, flush the catheter with a small amount of solution. Then, change positions again and take a breath or cough. Pull back on the syringe again. If you still do not see blood, finish flushing the catheter and contact your health care provider. Do not use your catheter until your health care provider says it is okay. General tips Have someone help you flush your catheter, if possible. Do not force fluid   through your catheter. Do not use a syringe that is larger or smaller than 10 mL. Using a smaller syringe can make the catheter burst. Do not use your catheter without flushing it first if it has heparin in it. Contact a health care provider if: You cannot see any blood in the catheter when you flush it before using it. Your catheter is difficult  to flush. Get help right away if: You cannot flush the catheter. The catheter leaks when you flush it or when there is fluid in it. There are cracks or breaks in the catheter. Summary It is important to flush your tunneled central venous catheter each time you use it, both before and after you use it. Scrub the injection cap for 15 seconds with a disinfecting wipe before and after you flush it. When you flush your catheter, make sure you follow any specific instructions from your health care provider or the manufacturer. Get help right away if you cannot flush the catheter. This information is not intended to replace advice given to you by your health care provider. Make sure you discuss any questions you have with your health care provider. Document Revised: 03/27/2019 Document Reviewed: 04/03/2018 Elsevier Patient Education  2022 Elsevier Inc.  

## 2020-09-24 NOTE — Patient Instructions (Signed)
[facility]  Discharge Instructions: Thank you for choosing [facility] to provide your oncology and hematology care.   If you have a lab appointment with the [facility], please go directly to the [facility] and check in at the registration area.  Wear comfortable clothing and clothing appropriate for easy access to any Portacath or PICC line.   We strive to give you quality time with your provider. You may need to reschedule your appointment if you arrive late (15 or more minutes).  Arriving late affects you and other patients whose appointments are after yours.  Also, if you miss three or more appointments without notifying the office, you may be dismissed from the clinic at the provider's discretion.      For prescription refill requests, have your pharmacy contact our office and allow 72 hours for refills to be completed.    Today you received the following chemotherapy and/or immunotherapy agents Blenrep    To help prevent nausea and vomiting after your treatment, we encourage you to take your nausea medication as directed.  BELOW ARE SYMPTOMS THAT SHOULD BE REPORTED IMMEDIATELY: *FEVER GREATER THAN 100.4 F (38 C) OR HIGHER *CHILLS OR SWEATING *NAUSEA AND VOMITING THAT IS NOT CONTROLLED WITH YOUR NAUSEA MEDICATION *UNUSUAL SHORTNESS OF BREATH *UNUSUAL BRUISING OR BLEEDING *URINARY PROBLEMS (pain or burning when urinating, or frequent urination) *BOWEL PROBLEMS (unusual diarrhea, constipation, pain near the anus) TENDERNESS IN MOUTH AND THROAT WITH OR WITHOUT PRESENCE OF ULCERS (sore throat, sores in mouth, or a toothache) UNUSUAL RASH, SWELLING OR PAIN  UNUSUAL VAGINAL DISCHARGE OR ITCHING   Items with * indicate a potential emergency and should be followed up as soon as possible or go to the Emergency Department if any problems should occur.  Please show the CHEMOTHERAPY ALERT CARD or IMMUNOTHERAPY ALERT CARD at check-in to the  Emergency Department and triage nurse. Should you have questions after your visit or need to cancel or reschedule your appointment, please contact [facility]  [phone] and follow the prompts.  Office hours are 8:00 a.m. to 4:30 p.m. Monday - Friday. Please note that voicemails left after 4:00 p.m. may not be returned until the following business day.  We are closed weekends and major holidays. You have access to a nurse at all times for urgent questions. Please call the main number to the clinic [phone] and follow the prompts.  For any non-urgent questions, you may also contact your provider using MyChart. We now offer e-Visits for anyone 68 and older to request care online for non-urgent symptoms. For details visit [email or web address].   Also download the MyChart app! Go to the app store, search "MyChart", open the app, select [facility], and log in with your MyChart username and password.  Due to Covid, a mask is required upon entering the hospital/clinic. If you do not have a mask, one will be given to you upon arrival. For doctor visits, patients may have 1 support person aged 68 or older with them. For treatment visits, patients cannot have anyone with them due to current Covid guidelines and our immunocompromised population.

## 2020-09-24 NOTE — Progress Notes (Signed)
Hematology and Oncology Follow Up Visit  Emily Greer [id number] April 15, 1950 70 y.o. 09/24/2020   Principle Diagnosis:  Recurrent lambda light chain myeloma - nl cytogenetics History of recurrent endometrial carcinoma Iron deficiency anemia -blood loss   Past Therapy:             Status post second autologous stem cell transplant on 07/24/2014 Maintenance therapy with Pomalidomide/every 2 week Velcade - d/c'ed Radiation therapy for endometrial recurrence - completed 04/20/2015 Pomalyst/Kyprolis 70mg /m2 IV q 2 weeks - s/p cycle #12 - held on 12/26/2017 for hematuria Daratumumab/Pomalyst (1 mg) - status post cycle 19 -- d/c on 08/21/2019 Melflufen 40 mg IV q 4 weeks -- started on 08/27/2019, s/p cycle #2 --  D/c due to FDA removal Selinexor 60 mg po q week -- start on 01/19/2020 -- changed on 03/18/2020 -- d/c on 04/27/2020   Current Therapy:        Blenrep 2.5 mg/m2 IV q 3 weeks -- started on 05/20/2020, s/p cycle 5 Femara 2.5 mg po q day  Xgeva 120 mg subcu every 3 months - next dose in 10/2020  IV iron as indicated   Interim History:  Emily Greer is here today for follow-up and treatment. She is feeling fatigued. She states that she has been keeping 2 of her grandsons and that has been a lot for her to keep up with.  Hgb is 9.8, MCV 75, platelets 140 and WBC count 4.8.  Earlier this month, M-spike was 0.2 g/dL, IgG level 086 mg/dL and lambda light chains 5.32 mg/dL.  She has a cataract in the left eye that her ophthalmologist is watching for now. She has a follow-up with them in December.  Both eyes are watery with the Xiidra. She states that her most recent follow-up to monitor her eyes while on Blenrep went well and that dry eyes is not currently an issue.  She has had some sinus congestion and drainage and is currently on Augmentin for a 7 day course.  No chills, n/v, cough, rash, dizziness, SOB, chest pain, palpitations, abdominal pain or changes in bowel or bladder habits.   No blood loss noted. No bruising or petechiae.  No swelling, tenderness, numbness or tingling in her extremities at this time. She has occasional puffiness in her feet and ankles if she is up for an extended period of time.  No falls or syncope.  She notes a decreased appetite but is doing her best to stay well hydrated. Her weight is down 4 lbs since her last visit.   ECOG Performance Status: 1 - Symptomatic but completely ambulatory  Medications:  Allergies as of 09/24/2020       Reactions   Codeine Nausea Only        Medication List        Accurate as of September 24, 2020 12:01 PM. If you have any questions, ask your nurse or doctor.          acyclovir 400 MG tablet Commonly known as: ZOVIRAX TAKE 1 TABLET(400 MG) BY MOUTH TWICE DAILY   albuterol 108 (90 Base) MCG/ACT inhaler Commonly known as: VENTOLIN HFA Inhale 2 puffs into the lungs every 6 (six) hours as needed for wheezing or shortness of breath. 2 puffs 3 times daily x 5 days then every 6 hours as needed.   amLODipine 10 MG tablet Commonly known as: NORVASC TAKE 1 TABLET(10 MG) BY MOUTH EVERY MORNING   amoxicillin-clavulanate 875-125 MG tablet Commonly known as: Augmentin Take 1 tablet  by mouth 2 (two) times daily for 7 days.   aspirin EC 81 MG tablet Take 81 mg by mouth daily.   benzonatate 100 MG capsule Commonly known as: Tessalon Perles Take 1 capsule (100 mg total) by mouth 3 (three) times daily as needed for cough.   CALCIUM 1200+D3 PO Take 1 tablet by mouth daily.   Carboxymethylcell-Glycerin PF 0.5-0.9 % Soln Place 2 drops into both eyes 4 (four) times daily.   cetirizine 10 MG tablet Commonly known as: ZYRTEC Take 1 tablet (10 mg total) by mouth at bedtime.   Darzalex 100 MG/5ML Generic drug: daratumumab   Fluocinolone Acetonide Scalp 0.01 % Oil Apply topically.   guaiFENesin-dextromethorphan 100-10 MG/5ML syrup Commonly known as: ROBITUSSIN DM Take 5 mLs by mouth every 4 (four)  hours as needed for cough.   hydrochlorothiazide 12.5 MG capsule Commonly known as: MICROZIDE Take 12.5 mg by mouth every morning.   letrozole 2.5 MG tablet Commonly known as: FEMARA Take 1 tablet (2.5 mg total) by mouth daily.   lidocaine-prilocaine cream Commonly known as: EMLA Apply 1 application topically as needed.   loperamide 2 MG capsule Commonly known as: IMODIUM Take by mouth as needed for diarrhea or loose stools. Reported on 08/19/2015   loratadine 10 MG tablet Commonly known as: CLARITIN Take 10 mg by mouth every morning. Reported on 08/19/2015   metoprolol succinate 50 MG 24 hr tablet Commonly known as: TOPROL-XL TAKE 1 TABLET(50 MG) BY MOUTH EVERY MORNING   Minoxidil for Men 5 % Soln Generic drug: MINOXIDIL (TOPICAL) SMARTSIG:Sparingly Topical Every Night   Rogaine Mens 5 % Foam Generic drug: Minoxidil See admin instructions.   montelukast 10 MG tablet Commonly known as: SINGULAIR TAKE 1 TABLET(10 MG) BY MOUTH AT BEDTIME   ondansetron 8 MG tablet Commonly known as: Zofran Take 1 tablet (8 mg total) by mouth 2 (two) times daily as needed (Nausea or vomiting).   potassium chloride SA 20 MEQ tablet Commonly known as: KLOR-CON TAKE 1 TABLET(20 MEQ) BY MOUTH TWICE DAILY   PROBIOTIC DAILY PO Take by mouth daily.   triamcinolone 55 MCG/ACT Aero nasal inhaler Commonly known as: NASACORT Place 2 sprays into the nose as needed.   triamterene-hydrochlorothiazide 37.5-25 MG tablet Commonly known as: MAXZIDE-25 1 tablet by mouth at bedtime   Vitamin D3 50 MCG (2000 UT) Tabs Take 2 tablets by mouth daily.   Xiidra 5 % Soln Generic drug: Lifitegrast   Zinc 50 MG Tabs 1 tablet   zinc gluconate 50 MG tablet Take 50 mg by mouth daily.        Allergies:  Allergies  Allergen Reactions   Codeine Nausea Only    Past Medical History, Surgical history, Social history, and Family History were reviewed and updated.  Review of Systems: All other 10  point review of systems is negative.   Physical Exam:  height is 5\' 2"  (1.575 m) and weight is 168 lb 1.9 oz (76.3 kg). Her oral temperature is 98.3 F (36.8 C). Her blood pressure is 132/77 and her pulse is 81. Her respiration is 18 and oxygen saturation is 98%.   Wt Readings from Last 3 Encounters:  09/24/20 168 lb 1.9 oz (76.3 kg)  09/01/20 172 lb (78 kg)  08/11/20 178 lb (80.7 kg)    Ocular: Sclerae unicteric, pupils equal, round and reactive to light Ear-nose-throat: Oropharynx clear, dentition fair Lymphatic: No cervical or supraclavicular adenopathy Lungs no rales or rhonchi, good excursion bilaterally Heart regular rate and rhythm, no  murmur appreciated Abd soft, nontender, positive bowel sounds MSK no focal spinal tenderness, no joint edema Neuro: non-focal, well-oriented, appropriate affect Breasts: Deferred   Lab Results  Component Value Date   WBC 4.8 09/24/2020   HGB 9.8 (L) 09/24/2020   HCT 31.0 (L) 09/24/2020   MCV 75.8 (L) 09/24/2020   PLT 140 (L) 09/24/2020   Lab Results  Component Value Date   FERRITIN 2,472 (H) 04/27/2020   IRON 98 04/27/2020   TIBC 361 04/27/2020   UIBC 263 04/27/2020   IRONPCTSAT 27 04/27/2020   Lab Results  Component Value Date   RETICCTPCT 1.6 11/05/2019   RBC 4.09 09/24/2020   Lab Results  Component Value Date   KPAFRELGTCHN 3.8 09/01/2020   LAMBDASER 53.2 (H) 09/01/2020   KAPLAMBRATIO 0.07 (L) 09/01/2020   Lab Results  Component Value Date   IGGSERUM 369 (L) 09/01/2020   IGA 8 (L) 09/01/2020   IGMSERUM <5 (L) 09/01/2020   Lab Results  Component Value Date   TOTALPROTELP 5.9 (L) 09/01/2020   ALBUMINELP 3.6 09/01/2020   A1GS 0.2 09/01/2020   A2GS 0.9 09/01/2020   BETS 0.9 09/01/2020   BETA2SER 0.4 11/23/2014   GAMS 0.2 (L) 09/01/2020   MSPIKE Not Observed 09/01/2020   SPEI Comment 05/20/2020     Chemistry      Component Value Date/Time   NA 141 09/01/2020 1115   NA 141 01/10/2017 1115   NA 140 06/21/2016  0918   K 3.4 (L) 09/01/2020 1115   K 4.0 01/10/2017 1115   K 4.3 06/21/2016 0918   CL 102 09/01/2020 1115   CL 106 01/10/2017 1115   CO2 31 09/01/2020 1115   CO2 27 01/10/2017 1115   CO2 20 (L) 06/21/2016 0918   BUN 12 09/01/2020 1115   BUN 15 01/10/2017 1115   BUN 15.8 06/21/2016 0918   CREATININE 0.71 09/01/2020 1115   CREATININE 1.0 01/10/2017 1115   CREATININE 0.8 06/21/2016 0918      Component Value Date/Time   CALCIUM 9.5 09/01/2020 1115   CALCIUM 9.5 01/10/2017 1115   CALCIUM 9.4 06/21/2016 0918   ALKPHOS 51 09/01/2020 1115   ALKPHOS 40 01/10/2017 1115   ALKPHOS 66 06/21/2016 0918   AST 18 09/01/2020 1115   AST 17 06/21/2016 0918   ALT 14 09/01/2020 1115   ALT 19 01/10/2017 1115   ALT 37 06/21/2016 0918   BILITOT 0.3 09/01/2020 1115   BILITOT 0.32 06/21/2016 0918       Impression and Plan: Ms. Martone is a very pleasant 70 yo African American female with recurrent lambda light chain myeloma. Her second stem cell transplant for light chain myeloma was in June 2016.  She also has localized recurrent endometrial cancer (followed by gyn onc Dr. Nelly Rout) and is currently on Femara.  We will proceed with Blenrep treatment today as planned.  Hgb is stable at 9.8.  Follow-up in 3 weeks.  She can contact our office with any questions or concerns.   Emeline Gins, NP 8/26/202212:01 PM

## 2020-09-25 LAB — IGG, IGA, IGM
IgA: 11 mg/dL — ABNORMAL LOW (ref 87–352)
IgG (Immunoglobin G), Serum: 357 mg/dL — ABNORMAL LOW (ref 586–1602)
IgM (Immunoglobulin M), Srm: <5 mg/dL — ABNORMAL LOW (ref 26–217)

## 2020-09-27 LAB — IRON AND TIBC
Iron: 43 ug/dL (ref 41–142)
Saturation Ratios: 14 % — ABNORMAL LOW (ref 21–57)
TIBC: 306 ug/dL (ref 236–444)
UIBC: 264 ug/dL (ref 120–384)

## 2020-09-27 LAB — KAPPA/LAMBDA LIGHT CHAINS
Kappa free light chain: 3.7 mg/L (ref 3.3–19.4)
Kappa, lambda light chain ratio: 0.08 — ABNORMAL LOW (ref 0.26–1.65)
Lambda free light chains: 49.3 mg/L — ABNORMAL HIGH (ref 5.7–26.3)

## 2020-09-27 LAB — FERRITIN: Ferritin: 4970 ng/mL — ABNORMAL HIGH (ref 11–307)

## 2020-09-28 ENCOUNTER — Other Ambulatory Visit: Payer: Self-pay

## 2020-09-28 ENCOUNTER — Inpatient Hospital Stay: Payer: Medicare Other

## 2020-09-28 VITALS — BP 157/83 | HR 78 | Temp 98.3°F | Resp 20

## 2020-09-28 DIAGNOSIS — C541 Malignant neoplasm of endometrium: Secondary | ICD-10-CM

## 2020-09-28 DIAGNOSIS — R58 Hemorrhage, not elsewhere classified: Secondary | ICD-10-CM | POA: Diagnosis not present

## 2020-09-28 DIAGNOSIS — C9000 Multiple myeloma not having achieved remission: Secondary | ICD-10-CM

## 2020-09-28 DIAGNOSIS — C9001 Multiple myeloma in remission: Secondary | ICD-10-CM

## 2020-09-28 DIAGNOSIS — D500 Iron deficiency anemia secondary to blood loss (chronic): Secondary | ICD-10-CM | POA: Diagnosis not present

## 2020-09-28 DIAGNOSIS — R197 Diarrhea, unspecified: Secondary | ICD-10-CM | POA: Diagnosis not present

## 2020-09-28 DIAGNOSIS — Z5112 Encounter for antineoplastic immunotherapy: Secondary | ICD-10-CM | POA: Diagnosis not present

## 2020-09-28 LAB — PROTEIN ELECTROPHORESIS, SERUM
A/G Ratio: 1.5 (ref 0.7–1.7)
Albumin ELP: 3.3 g/dL (ref 2.9–4.4)
Alpha-1-Globulin: 0.2 g/dL (ref 0.0–0.4)
Alpha-2-Globulin: 0.9 g/dL (ref 0.4–1.0)
Beta Globulin: 0.9 g/dL (ref 0.7–1.3)
Gamma Globulin: 0.2 g/dL — ABNORMAL LOW (ref 0.4–1.8)
Globulin, Total: 2.2 g/dL (ref 2.2–3.9)
Total Protein ELP: 5.5 g/dL — ABNORMAL LOW (ref 6.0–8.5)

## 2020-09-28 MED ORDER — SODIUM CHLORIDE 0.9% FLUSH
10.0000 mL | Freq: Once | INTRAVENOUS | Status: AC | PRN
  Administered 2020-09-28: 10 mL

## 2020-09-28 MED ORDER — SODIUM CHLORIDE 0.9 % IV SOLN
Freq: Once | INTRAVENOUS | Status: AC
Start: 2020-09-28 — End: 2020-09-28

## 2020-09-28 MED ORDER — SODIUM CHLORIDE 0.9 % IV SOLN
510.0000 mg | Freq: Once | INTRAVENOUS | Status: AC
  Administered 2020-09-28: 510 mg via INTRAVENOUS
  Filled 2020-09-28: qty 17

## 2020-09-28 MED ORDER — HEPARIN SOD (PORK) LOCK FLUSH 100 UNIT/ML IV SOLN
500.0000 [IU] | Freq: Once | INTRAVENOUS | Status: AC | PRN
  Administered 2020-09-28: 500 [IU]

## 2020-09-28 NOTE — Patient Instructions (Signed)
Ferumoxytol injection What is this medication? FERUMOXYTOL is an iron complex. Iron is used to make healthy red blood cells, which carry oxygen and nutrients throughout the body. This medicine is used totreat iron deficiency anemia. This medicine may be used for other purposes; ask your health care provider orpharmacist if you have questions. COMMON BRAND NAME(S): Feraheme What should I tell my care team before I take this medication? They need to know if you have any of these conditions: anemia not caused by low iron levels high levels of iron in the blood magnetic resonance imaging (MRI) test scheduled an unusual or allergic reaction to iron, other medicines, foods, dyes, or preservatives pregnant or trying to get pregnant breast-feeding How should I use this medication? This medicine is for injection into a vein. It is given by a health careprofessional in a hospital or clinic setting. Talk to your pediatrician regarding the use of this medicine in children.Special care may be needed. Overdosage: If you think you have taken too much of this medicine contact apoison control center or emergency room at once. NOTE: This medicine is only for you. Do not share this medicine with others. What if I miss a dose? It is important not to miss your dose. Call your doctor or health careprofessional if you are unable to keep an appointment. What may interact with this medication? This medicine may interact with the following medications: other iron products This list may not describe all possible interactions. Give your health care provider a list of all the medicines, herbs, non-prescription drugs, or dietary supplements you use. Also tell them if you smoke, drink alcohol, or use illegaldrugs. Some items may interact with your medicine. What should I watch for while using this medication? Visit your doctor or healthcare professional regularly. Tell your doctor or healthcare professional if your  symptoms do not start to get better or if theyget worse. You may need blood work done while you are taking this medicine. You may need to follow a special diet. Talk to your doctor. Foods that contain iron include: whole grains/cereals, dried fruits, beans, or peas, leafy greenvegetables, and organ meats (liver, kidney). What side effects may I notice from receiving this medication? Side effects that you should report to your doctor or health care professionalas soon as possible: allergic reactions like skin rash, itching or hives, swelling of the face, lips, or tongue breathing problems changes in blood pressure feeling faint or lightheaded, falls fever or chills flushing, sweating, or hot feelings swelling of the ankles or feet Side effects that usually do not require medical attention (report to yourdoctor or health care professional if they continue or are bothersome): diarrhea headache nausea, vomiting stomach pain This list may not describe all possible side effects. Call your doctor for medical advice about side effects. You may report side effects to FDA at1-800-FDA-1088. Where should I keep my medication? This drug is given in a hospital or clinic and will not be stored at home. NOTE: This sheet is a summary. It may not cover all possible information. If you have questions about this medicine, talk to your doctor, pharmacist, orhealth care provider.  2022 Elsevier/Gold Standard (2016-03-06 20:21:10)  

## 2020-09-29 ENCOUNTER — Ambulatory Visit (INDEPENDENT_AMBULATORY_CARE_PROVIDER_SITE_OTHER): Payer: Medicare Other

## 2020-09-29 ENCOUNTER — Ambulatory Visit: Payer: Medicare Other | Admitting: Internal Medicine

## 2020-09-29 VITALS — Ht 64.0 in | Wt 174.0 lb

## 2020-09-29 DIAGNOSIS — Z0000 Encounter for general adult medical examination without abnormal findings: Secondary | ICD-10-CM | POA: Diagnosis not present

## 2020-09-29 NOTE — Patient Instructions (Signed)
Emily Greer , Thank you for taking time to come for your Medicare Wellness Visit. I appreciate your ongoing commitment to your health goals. Please review the following plan we discussed and let me know if I can assist you in the future.   Screening recommendations/referrals: Colonoscopy: completed 08/10/2017 Mammogram: completed 11/10/2019 Bone Density: completed 01/01/2019 Recommended yearly ophthalmology/optometry visit for glaucoma screening and checkup Recommended yearly dental visit for hygiene and checkup  Vaccinations: Influenza vaccine: due Pneumococcal vaccine: completed 10/28/2015 Tdap vaccine: completed 08/27/2015, due 08/26/2025 Shingles vaccine: discussed   Covid-19:06/09/2020, 11/10/2020, 03/12/2019, 02/19/2019  Advanced directives: Advance directive discussed with you today. .   Conditions/risks identified: none  Next appointment: Follow up in one year for your annual wellness visit    Preventive Care 65 Years and Older, Female Preventive care refers to lifestyle choices and visits with your health care provider that can promote health and wellness. What does preventive care include? A yearly physical exam. This is also called an annual well check. Dental exams once or twice a year. Routine eye exams. Ask your health care provider how often you should have your eyes checked. Personal lifestyle choices, including: Daily care of your teeth and gums. Regular physical activity. Eating a healthy diet. Avoiding tobacco and drug use. Limiting alcohol use. Practicing safe sex. Taking low-dose aspirin every day. Taking vitamin and mineral supplements as recommended by your health care provider. What happens during an annual well check? The services and screenings done by your health care provider during your annual well check will depend on your age, overall health, lifestyle risk factors, and family history of disease. Counseling  Your health care provider may ask you  questions about your: Alcohol use. Tobacco use. Drug use. Emotional well-being. Home and relationship well-being. Sexual activity. Eating habits. History of falls. Memory and ability to understand (cognition). Work and work Statistician. Reproductive health. Screening  You may have the following tests or measurements: Height, weight, and BMI. Blood pressure. Lipid and cholesterol levels. These may be checked every 5 years, or more frequently if you are over 50 years old. Skin check. Lung cancer screening. You may have this screening every year starting at age 26 if you have a 30-pack-year history of smoking and currently smoke or have quit within the past 15 years. Fecal occult blood test (FOBT) of the stool. You may have this test every year starting at age 104. Flexible sigmoidoscopy or colonoscopy. You may have a sigmoidoscopy every 5 years or a colonoscopy every 10 years starting at age 57. Hepatitis C blood test. Hepatitis B blood test. Sexually transmitted disease (STD) testing. Diabetes screening. This is done by checking your blood sugar (glucose) after you have not eaten for a while (fasting). You may have this done every 1-3 years. Bone density scan. This is done to screen for osteoporosis. You may have this done starting at age 25. Mammogram. This may be done every 1-2 years. Talk to your health care provider about how often you should have regular mammograms. Talk with your health care provider about your test results, treatment options, and if necessary, the need for more tests. Vaccines  Your health care provider may recommend certain vaccines, such as: Influenza vaccine. This is recommended every year. Tetanus, diphtheria, and acellular pertussis (Tdap, Td) vaccine. You may need a Td booster every 10 years. Zoster vaccine. You may need this after age 45. Pneumococcal 13-valent conjugate (PCV13) vaccine. One dose is recommended after age 11. Pneumococcal polysaccharide  (PPSV23) vaccine. One  dose is recommended after age 27. Talk to your health care provider about which screenings and vaccines you need and how often you need them. This information is not intended to replace advice given to you by your health care provider. Make sure you discuss any questions you have with your health care provider. Document Released: 02/12/2015 Document Revised: 10/06/2015 Document Reviewed: 11/17/2014 Elsevier Interactive Patient Education  2017 [place] Prevention in the Home Falls can cause injuries. They can happen to people of all ages. There are many things you can do to make your home safe and to help prevent falls. What can I do on the outside of my home? Regularly fix the edges of walkways and driveways and fix any cracks. Remove anything that might make you trip as you walk through a door, such as a raised step or threshold. Trim any bushes or trees on the path to your home. Use bright outdoor lighting. Clear any walking paths of anything that might make someone trip, such as rocks or tools. Regularly check to see if handrails are loose or broken. Make sure that both sides of any steps have handrails. Any raised decks and porches should have guardrails on the edges. Have any leaves, snow, or ice cleared regularly. Use sand or salt on walking paths during winter. Clean up any spills in your garage right away. This includes oil or grease spills. What can I do in the bathroom? Use night lights. Install grab bars by the toilet and in the tub and shower. Do not use towel bars as grab bars. Use non-skid mats or decals in the tub or shower. If you need to sit down in the shower, use a plastic, non-slip stool. Keep the floor dry. Clean up any water that spills on the floor as soon as it happens. Remove soap buildup in the tub or shower regularly. Attach bath mats securely with double-sided non-slip rug tape. Do not have throw rugs and other things on the  floor that can make you trip. What can I do in the bedroom? Use night lights. Make sure that you have a light by your bed that is easy to reach. Do not use any sheets or blankets that are too big for your bed. They should not hang down onto the floor. Have a firm chair that has side arms. You can use this for support while you get dressed. Do not have throw rugs and other things on the floor that can make you trip. What can I do in the kitchen? Clean up any spills right away. Avoid walking on wet floors. Keep items that you use a lot in easy-to-reach places. If you need to reach something above you, use a strong step stool that has a grab bar. Keep electrical cords out of the way. Do not use floor polish or wax that makes floors slippery. If you must use wax, use non-skid floor wax. Do not have throw rugs and other things on the floor that can make you trip. What can I do with my stairs? Do not leave any items on the stairs. Make sure that there are handrails on both sides of the stairs and use them. Fix handrails that are broken or loose. Make sure that handrails are as long as the stairways. Check any carpeting to make sure that it is firmly attached to the stairs. Fix any carpet that is loose or worn. Avoid having throw rugs at the top or bottom of the stairs.  If you do have throw rugs, attach them to the floor with carpet tape. Make sure that you have a light switch at the top of the stairs and the bottom of the stairs. If you do not have them, ask someone to add them for you. What else can I do to help prevent falls? Wear shoes that: Do not have high heels. Have rubber bottoms. Are comfortable and fit you well. Are closed at the toe. Do not wear sandals. If you use a stepladder: Make sure that it is fully opened. Do not climb a closed stepladder. Make sure that both sides of the stepladder are locked into place. Ask someone to hold it for you, if possible. Clearly mark and make  sure that you can see: Any grab bars or handrails. First and last steps. Where the edge of each step is. Use tools that help you move around (mobility aids) if they are needed. These include: Canes. Walkers. Scooters. Crutches. Turn on the lights when you go into a dark area. Replace any light bulbs as soon as they burn out. Set up your furniture so you have a clear path. Avoid moving your furniture around. If any of your floors are uneven, fix them. If there are any pets around you, be aware of where they are. Review your medicines with your doctor. Some medicines can make you feel dizzy. This can increase your chance of falling. Ask your doctor what other things that you can do to help prevent falls. This information is not intended to replace advice given to you by your health care provider. Make sure you discuss any questions you have with your health care provider. Document Released: 11/12/2008 Document Revised: 06/24/2015 Document Reviewed: 02/20/2014 Elsevier Interactive Patient Education  2017 [employer].

## 2020-09-29 NOTE — Progress Notes (Signed)
I connected with Emily Greer today by telephone and verified that I am speaking with the correct person using two identifiers. Location patient: home Location provider: work Persons participating in the virtual visit: Mallie Giambra, Glenna Durand LPN.   I discussed the limitations, risks, security and privacy concerns of performing an evaluation and management service by telephone and the availability of in person appointments. I also discussed with the patient that there may be a patient responsible charge related to this service. The patient expressed understanding and verbally consented to this telephonic visit.    Interactive audio and video telecommunications were attempted between this provider and patient, however failed, due to patient having technical difficulties OR patient did not have access to video capability.  We continued and completed visit with audio only.     Vital signs may be patient reported or missing.  Subjective:   Emily Greer is a 70 y.o. female who presents for Medicare Annual (Subsequent) preventive examination.  Review of Systems     Cardiac Risk Factors include: advanced age (>64men, >71 women);hypertension;sedentary lifestyle     Objective:    Today's Vitals   09/29/20 1358  Weight: 174 lb (78.9 kg)  Height: 5\' 4"  (1.626 m)   Body mass index is 29.87 kg/m.  Advanced Directives 09/29/2020 09/24/2020 08/11/2020 07/22/2020 06/30/2020 06/30/2020 06/10/2020  Does Patient Have a Medical Advance Directive? No No No No No No No  Does patient want to make changes to medical advance directive? - No - Patient declined No - Patient declined - No - Patient declined No - Patient declined No - Patient declined  Would patient like information on creating a medical advance directive? - No - Patient declined - No - Patient declined - No - Patient declined -  Pre-existing out of facility DNR order (yellow form or pink MOST form) - - - - - - -    Current  Medications (verified) Outpatient Encounter Medications as of 09/29/2020  Medication Sig   acyclovir (ZOVIRAX) 400 MG tablet TAKE 1 TABLET(400 MG) BY MOUTH TWICE DAILY   albuterol (VENTOLIN HFA) 108 (90 Base) MCG/ACT inhaler Inhale 2 puffs into the lungs every 6 (six) hours as needed for wheezing or shortness of breath. 2 puffs 3 times daily x 5 days then every 6 hours as needed.   amLODipine (NORVASC) 10 MG tablet TAKE 1 TABLET(10 MG) BY MOUTH EVERY MORNING   amoxicillin-clavulanate (AUGMENTIN) 875-125 MG tablet Take 1 tablet by mouth 2 (two) times daily for 7 days.   aspirin EC 81 MG tablet Take 81 mg by mouth daily.   benzonatate (TESSALON PERLES) 100 MG capsule Take 1 capsule (100 mg total) by mouth 3 (three) times daily as needed for cough.   Calcium-Magnesium-Vitamin D (CALCIUM 1200+D3 PO) Take 1 tablet by mouth daily.   Carboxymethylcell-Glycerin PF 0.5-0.9 % SOLN Place 2 drops into both eyes 4 (four) times daily.   cetirizine (ZYRTEC) 10 MG tablet Take 1 tablet (10 mg total) by mouth at bedtime.   Cholecalciferol (VITAMIN D3) 2000 UNITS TABS Take 2 tablets by mouth daily.   daratumumab (DARZALEX) 100 MG/5ML    guaiFENesin-dextromethorphan (ROBITUSSIN DM) 100-10 MG/5ML syrup Take 5 mLs by mouth every 4 (four) hours as needed for cough.   hydrochlorothiazide (MICROZIDE) 12.5 MG capsule Take 12.5 mg by mouth every morning.   letrozole (FEMARA) 2.5 MG tablet Take 1 tablet (2.5 mg total) by mouth daily.   lidocaine-prilocaine (EMLA) cream Apply 1 application topically as needed.  loratadine (CLARITIN) 10 MG tablet Take 10 mg by mouth every morning. Reported on 08/19/2015   metoprolol succinate (TOPROL-XL) 50 MG 24 hr tablet TAKE 1 TABLET(50 MG) BY MOUTH EVERY MORNING   Minoxidil (ROGAINE MENS) 5 % FOAM See admin instructions.   MINOXIDIL FOR MEN 5 % SOLN SMARTSIG:Sparingly Topical Every Night   montelukast (SINGULAIR) 10 MG tablet TAKE 1 TABLET(10 MG) BY MOUTH AT BEDTIME   ondansetron  (ZOFRAN) 8 MG tablet Take 1 tablet (8 mg total) by mouth 2 (two) times daily as needed (Nausea or vomiting).   potassium chloride SA (KLOR-CON) 20 MEQ tablet TAKE 1 TABLET(20 MEQ) BY MOUTH TWICE DAILY   Probiotic Product (PROBIOTIC DAILY PO) Take by mouth daily.   triamcinolone (NASACORT) 55 MCG/ACT AERO nasal inhaler Place 2 sprays into the nose as needed.    triamterene-hydrochlorothiazide (MAXZIDE-25) 37.5-25 MG tablet 1 tablet by mouth at bedtime   XIIDRA 5 % SOLN    Zinc 50 MG TABS 1 tablet   zinc gluconate 50 MG tablet Take 50 mg by mouth daily.   Fluocinolone Acetonide Scalp 0.01 % OIL Apply topically. (Patient not taking: Reported on 09/29/2020)   loperamide (IMODIUM) 2 MG capsule Take by mouth as needed for diarrhea or loose stools. Reported on 08/19/2015 (Patient not taking: No sig reported)   Facility-Administered Encounter Medications as of 09/29/2020  Medication   0.9 %  sodium chloride infusion   albuterol (PROVENTIL) (2.5 MG/3ML) 0.083% nebulizer solution 2.5 mg   sodium chloride flush (NS) 0.9 % injection 10 mL   sodium chloride flush (NS) 0.9 % injection 10 mL   sodium chloride flush (NS) 0.9 % injection 10 mL   sodium chloride flush (NS) 0.9 % injection 10 mL   sodium chloride flush (NS) 0.9 % injection 10 mL    Allergies (verified) Codeine   History: Past Medical History:  Diagnosis Date   Avascular necrosis of femoral head ([place])    bilateral per CT 07-26-2015   Endometrial carcinoma [facility]) gyn oncologist-  dr Skeet Latch ([facility])/  radiation oncologist-- dr Sondra Come   2013 dx  FIGO Stage 1A, Grade 2 endometrioid endometrial cancer s/p TAH w/ BSO and bilateral pelvic node dissection 10-31-2011 ;  recurrence at distal vagina 04/ 2014 s/p  brachytherapy (ended 07-29-2012);  2nd recurrence 12/ 2016  vaginal apex s/p  conformational radiotherapy 03-10-2015 to 04-20-2015   Family history of adverse reaction to anesthesia    mother ponv   GERD (gastroesophageal  reflux disease)    Goals of care, counseling/discussion 08/21/2019   H/O stem cell transplant ([place])    02/ 2000 and second one 06/ 2016   History of bacteremia    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   History of chemotherapy    last chemo 12-26-17   History of radiation therapy 6/4, 6/11, 6/19, 6/25, 07/29/2012   vagina 30.5 gray in 5 fx, HDR brachytherapy:   last radiation to vagina 03-10-2015 to 04-20-2015  50.4gray   History of radiation therapy 03/10/15-04/20/15   vagina 50.4 in 28 fractions   Hypertension    Lambda light chain myeloma [facility]) oncologist-  dr Marin Olp ([facility])  and  [facility] -- dr Salena Saner gasparetto   dx 07/ 1999 s/p  VAD chemotherapy 11/ 1999,  purged autotransplant 03-21-1998 followed by auto stem cell transplant 03-29-1998;  recurrance w/ second autologous stem cell transplant 07-24-2014;  in Re-mission currently , chemo maintenance therapy   Osteoporosis 12/18/05   Increased  risk    PONV (postoperative nausea and vomiting)    Wears glasses    Past Surgical History:  Procedure Laterality Date   ABDOMINAL HYSTERECTOMY  10/31/2011   Procedure: HYSTERECTOMY ABDOMINAL;  Surgeon: [name], MD PHD;  Location: [facility];  Service: Gynecology;  Laterality: N/A;   CO2 LASER APPLICATION N/A 05/18/6220   Procedure: CO2 LASER APPLICATION;  Surgeon: [name], MD;  Location: [facility];  Service: Gynecology;  Laterality: N/A;   CYSTOSCOPY WITH RETROGRADE PYELOGRAM, URETEROSCOPY AND STENT PLACEMENT Left 02/04/2018   Procedure: CYSTOSCOPY WITH RETROGRADE PYELOGRAM, URETEROSCOPY , bladder biopsy and fulgeration;  Surgeon: Cleon Gustin, MD;  Location: [facility];  Service: Urology;  Laterality: Left;   ECTOPIC PREGNANCY SURGERY  1992   HYSTEROSCOPY WITH D & C  09/27/2011   Procedure: DILATATION AND CURETTAGE /HYSTEROSCOPY;  Surgeon: Eldred Manges, MD;  Location: [facility];  Service: Gynecology;;   IR FLUORO GUIDE  PORT INSERTION RIGHT  09/08/2016   IR US GUIDE VASC ACCESS RIGHT  09/08/2016   LAPAROTOMY  10/31/2011   Procedure: EXPLORATORY LAPAROTOMY;  Surgeon: [name], MD PHD;  Location: [facility];  Service: Gynecology;  Laterality: N/A;   SALPINGOOPHORECTOMY  10/31/2011   Procedure: SALPINGO OOPHERECTOMY;  Surgeon: [name], MD PHD;  Location: [facility];  Service: Gynecology;  Laterality: Bilateral;   Lymph Nodes sampling   TUBAL LIGATION  1986   Family History  Problem Relation Age of Onset   Colon cancer Mother    Hypertension Father    Heart Problems Father    Social History   Socioeconomic History   Marital status: Married    Spouse name: Not on file   Number of children: Not on file   Years of education: Not on file   Highest education level: Not on file  Occupational History   Occupation: retired  Tobacco Use   Smoking status: Never   Smokeless tobacco: Never  Vaping Use   Vaping Use: Never used  Substance and Sexual Activity   Alcohol use: Not Currently    Alcohol/week: 0.0 standard drinks   Drug use: Never   Sexual activity: Yes  Other Topics Concern   Not on file  Social History Narrative   Not on file   Social Determinants of Health   Financial Resource Strain: Low Risk    Difficulty of Paying Living Expenses: Not hard at all  Food Insecurity: No Food Insecurity   Worried About Charity fundraiser in the Last Year: Never true   [place] in the Last Year: Never true  Transportation Needs: No Transportation Needs   Lack of Transportation (Medical): No   Lack of Transportation (Non-Medical): No  Physical Activity: Inactive   Days of Exercise per Week: 0 days   Minutes of Exercise per Session: 0 min  Stress: No Stress Concern Present   Feeling of Stress : Not at all  Social Connections: Not on file    Tobacco Counseling Counseling given: Not Answered   Clinical Intake:  Pre-visit preparation completed: Yes  Pain : No/denies pain      Nutritional Status: BMI 25 -29 Overweight Nutritional Risks: None Diabetes: No  How often do you need to have someone help you when you read instructions, pamphlets, or other written materials from your doctor or pharmacy?: 1 - Never What is the last grade level you completed in school?: 2 master's degrees  Diabetic? no  Interpreter Needed?: No  Information  entered by :: NAllen LPN   Activities of Daily Living In your present state of health, do you have any difficulty performing the following activities: 09/29/2020  Hearing? N  Vision? Y  Difficulty concentrating or making decisions? N  Walking or climbing stairs? N  Dressing or bathing? N  Doing errands, shopping? Y  Comment sometimes husband Restaurant manager, fast food and eating ? N  Using the Toilet? N  In the past six months, have you accidently leaked urine? N  Do you have problems with loss of bowel control? N  Managing your Medications? N  Managing your Finances? N  Housekeeping or managing your Housekeeping? N  Some recent data might be hidden    Patient Care Team: Glendale Chard, MD as PCP - General (Internal Medicine) Rex Kras, Claudette Stapler, RN as [place] any recent [place] you may have received from other than [facility] providers in the past year (date may be approximate).     Assessment:   This is a routine wellness examination for Emily Greer.  Hearing/Vision screen Vision Screening - Comments:: Regular eye exams, Dr. Katy Fitch, Dr. Jerline Pain  Dietary issues and exercise activities discussed: Current Exercise Habits: The patient does not participate in regular exercise at present   Goals Addressed             This Visit's Progress    Patient Stated       8/311/2022, wants to start exercise       Depression Screen PHQ 2/9 Scores 09/29/2020 09/24/2019 09/23/2019 09/18/2018 03/19/2018 02/18/2018 12/03/2017  PHQ - 2 Score 0 0 0 0 0 0 0  PHQ- 9 Score - 0 - - - - -    Fall  Risk Fall Risk  09/29/2020 09/24/2019 09/23/2019 09/18/2018 03/19/2018  Falls in the past year? 0 0 0 0 0  Risk for fall due to : Medication side effect Medication side effect - Medication side effect -  Follow up Falls evaluation completed;Education provided;Falls prevention discussed Falls evaluation completed;Education provided;Falls prevention discussed - - -    FALL RISK PREVENTION PERTAINING TO THE HOME:  Any stairs in or around the home? Yes  If so, are there any without handrails? No  Home free of loose throw rugs in walkways, pet beds, electrical cords, etc? Yes  Adequate lighting in your home to reduce risk of falls? Yes   ASSISTIVE DEVICES UTILIZED TO PREVENT FALLS:  Life alert? No  Use of a cane, walker or w/c? No  Grab bars in the bathroom? No  Shower chair or bench in shower? No  Elevated toilet seat or a handicapped toilet? No   TIMED UP AND GO:  Was the test performed? No .       Cognitive Function:     6CIT Screen 09/29/2020 09/24/2019 09/18/2018  What Year? 0 points 0 points 0 points  What month? 0 points 0 points 0 points  What time? 0 points 0 points 0 points  Count back from 20 0 points 0 points 0 points  Months in reverse 0 points 0 points 0 points  Repeat phrase 0 points 4 points 0 points  Total Score 0 4 0    Immunizations Immunization History  Administered Date(s) Administered   DTaP 08/25/2015, 10/28/2015   Hepatitis B, adult 08/25/2015, 10/28/2015   HiB (PRP-T) 08/25/2015, 10/28/2015   IPV 08/25/2015, 10/28/2015   Influenza Split 11/01/2011   Influenza, High Dose Seasonal PF 11/12/2018   Influenza,inj,Quad PF,6+ Mos 11/03/2015, 10/31/2017  Influenza-Unspecified 10/30/2013, 10/28/2014   PFIZER(Purple Top)SARS-COV-2 Vaccination 02/19/2019, 03/12/2019, 11/11/2019, 06/09/2020   Pneumococcal Conjugate-13 02/12/2015, 08/25/2015, 10/28/2015   Pneumococcal Polysaccharide-23 11/05/2013   Tdap 08/27/2015    TDAP status: Up to date  Flu Vaccine  status: Due, Education has been provided regarding the importance of this vaccine. Advised may receive this vaccine at local pharmacy or Health Dept. Aware to provide a copy of the vaccination record if obtained from local pharmacy or Health Dept. Verbalized acceptance and understanding.  Pneumococcal vaccine status: Up to date  Covid-19 vaccine status: Completed vaccines  Qualifies for Shingles Vaccine? Yes   Zostavax completed No   Shingrix Completed?: No.    Education has been provided regarding the importance of this vaccine. Patient has been advised to call insurance company to determine out of pocket expense if they have not yet received this vaccine. Advised may also receive vaccine at local pharmacy or Health Dept. Verbalized acceptance and understanding.  Screening Tests Health Maintenance  Topic Date Due   Hepatitis C Screening  Never done   Zoster Vaccines- Shingrix (1 of 2) Never done   PNA vac Low Risk Adult (2 of 2 - PPSV23) 11/06/2018   INFLUENZA VACCINE  10/11/2021 (Originally 08/30/2020)   COVID-19 Vaccine (5 - Booster for Pfizer series) 10/10/2020   MAMMOGRAM  11/09/2021   TETANUS/TDAP  08/26/2025   COLONOSCOPY (Pts 45-62yrs Insurance coverage will need to be confirmed)  08/11/2027   DEXA SCAN  Completed   HPV VACCINES  Aged Out    Health Maintenance  Health Maintenance Due  Topic Date Due   Hepatitis C Screening  Never done   Zoster Vaccines- Shingrix (1 of 2) Never done   PNA vac Low Risk Adult (2 of 2 - PPSV23) 11/06/2018    Colorectal cancer screening: Type of screening: Colonoscopy. Completed 08/10/2017. Repeat every 10 years  Mammogram status: Completed 11/10/2019. Repeat every year  Bone Density status: Completed 01/01/2019.   Lung Cancer Screening: (Low Dose CT Chest recommended if Age 63-80 years, 30 pack-year currently smoking OR have quit w/in 15years.) does not qualify.   Lung Cancer Screening Referral: no  Additional Screening:  Hepatitis C  Screening: does qualify;   Vision Screening: Recommended annual ophthalmology exams for early detection of glaucoma and other disorders of the eye. Is the patient up to date with their annual eye exam?  Yes  Who is the provider or what is the name of the office in which the patient attends annual eye exams? Dr. Katy Fitch, Dr. Jerline Pain If pt is not established with a provider, would they like to be referred to a provider to establish care? No .   Dental Screening: Recommended annual dental exams for proper oral hygiene  Community Resource Referral / Chronic Care Management: CRR required this visit?  No   CCM required this visit?  No      Plan:     I have personally reviewed and noted the following in the patient's chart:   Medical and social history Use of alcohol, tobacco or illicit drugs  Current medications and supplements including opioid prescriptions.  Functional ability and status Nutritional status Physical activity Advanced directives List of other physicians Hospitalizations, surgeries, and ER visits in previous 12 months Vitals Screenings to include cognitive, depression, and falls Referrals and appointments  In addition, I have reviewed and discussed with patient certain preventive protocols, quality metrics, and best practice recommendations. A written personalized care plan for preventive services as well as general preventive health  recommendations were provided to patient.     Kellie Simmering, LPN   8/50/2774   Nurse Notes:

## 2020-10-08 DIAGNOSIS — H16143 Punctate keratitis, bilateral: Secondary | ICD-10-CM | POA: Diagnosis not present

## 2020-10-08 DIAGNOSIS — H25813 Combined forms of age-related cataract, bilateral: Secondary | ICD-10-CM | POA: Diagnosis not present

## 2020-10-08 DIAGNOSIS — H04223 Epiphora due to insufficient drainage, bilateral lacrimal glands: Secondary | ICD-10-CM | POA: Diagnosis not present

## 2020-10-13 ENCOUNTER — Encounter: Payer: Self-pay | Admitting: General Practice

## 2020-10-19 ENCOUNTER — Encounter: Payer: Self-pay | Admitting: *Deleted

## 2020-10-19 ENCOUNTER — Inpatient Hospital Stay: Payer: Medicare Other | Attending: Hematology & Oncology

## 2020-10-19 ENCOUNTER — Inpatient Hospital Stay: Payer: Medicare Other

## 2020-10-19 ENCOUNTER — Other Ambulatory Visit: Payer: Self-pay

## 2020-10-19 ENCOUNTER — Inpatient Hospital Stay (HOSPITAL_BASED_OUTPATIENT_CLINIC_OR_DEPARTMENT_OTHER): Payer: Medicare Other | Admitting: Hematology & Oncology

## 2020-10-19 ENCOUNTER — Encounter: Payer: Self-pay | Admitting: Hematology & Oncology

## 2020-10-19 VITALS — BP 134/94 | HR 91 | Temp 98.0°F | Resp 19 | Wt 173.5 lb

## 2020-10-19 DIAGNOSIS — R58 Hemorrhage, not elsewhere classified: Secondary | ICD-10-CM | POA: Diagnosis not present

## 2020-10-19 DIAGNOSIS — H538 Other visual disturbances: Secondary | ICD-10-CM | POA: Diagnosis not present

## 2020-10-19 DIAGNOSIS — H269 Unspecified cataract: Secondary | ICD-10-CM | POA: Insufficient documentation

## 2020-10-19 DIAGNOSIS — R5383 Other fatigue: Secondary | ICD-10-CM | POA: Insufficient documentation

## 2020-10-19 DIAGNOSIS — C9000 Multiple myeloma not having achieved remission: Secondary | ICD-10-CM | POA: Diagnosis not present

## 2020-10-19 DIAGNOSIS — D500 Iron deficiency anemia secondary to blood loss (chronic): Secondary | ICD-10-CM | POA: Diagnosis not present

## 2020-10-19 DIAGNOSIS — Z5112 Encounter for antineoplastic immunotherapy: Secondary | ICD-10-CM | POA: Insufficient documentation

## 2020-10-19 DIAGNOSIS — Z79899 Other long term (current) drug therapy: Secondary | ICD-10-CM | POA: Insufficient documentation

## 2020-10-19 DIAGNOSIS — C541 Malignant neoplasm of endometrium: Secondary | ICD-10-CM

## 2020-10-19 LAB — CBC WITH DIFFERENTIAL (CANCER CENTER ONLY)
Abs Immature Granulocytes: 0.08 10*3/uL — ABNORMAL HIGH (ref 0.00–0.07)
Basophils Absolute: 0.1 10*3/uL (ref 0.0–0.1)
Basophils Relative: 1 %
Eosinophils Absolute: 0.1 10*3/uL (ref 0.0–0.5)
Eosinophils Relative: 3 %
HCT: 34.2 % — ABNORMAL LOW (ref 36.0–46.0)
Hemoglobin: 10.5 g/dL — ABNORMAL LOW (ref 12.0–15.0)
Immature Granulocytes: 2 %
Lymphocytes Relative: 16 %
Lymphs Abs: 0.8 10*3/uL (ref 0.7–4.0)
MCH: 23.7 pg — ABNORMAL LOW (ref 26.0–34.0)
MCHC: 30.7 g/dL (ref 30.0–36.0)
MCV: 77.2 fL — ABNORMAL LOW (ref 80.0–100.0)
Monocytes Absolute: 0.6 10*3/uL (ref 0.1–1.0)
Monocytes Relative: 12 %
Neutro Abs: 3.3 10*3/uL (ref 1.7–7.7)
Neutrophils Relative %: 66 %
Platelet Count: 158 10*3/uL (ref 150–400)
RBC: 4.43 MIL/uL (ref 3.87–5.11)
RDW: 21.2 % — ABNORMAL HIGH (ref 11.5–15.5)
WBC Count: 5 10*3/uL (ref 4.0–10.5)
nRBC: 0 % (ref 0.0–0.2)

## 2020-10-19 LAB — CMP (CANCER CENTER ONLY)
ALT: 23 U/L (ref 0–44)
AST: 26 U/L (ref 15–41)
Albumin: 4.1 g/dL (ref 3.5–5.0)
Alkaline Phosphatase: 69 U/L (ref 38–126)
Anion gap: 9 (ref 5–15)
BUN: 12 mg/dL (ref 8–23)
CO2: 29 mmol/L (ref 22–32)
Calcium: 9.3 mg/dL (ref 8.9–10.3)
Chloride: 104 mmol/L (ref 98–111)
Creatinine: 0.77 mg/dL (ref 0.44–1.00)
GFR, Estimated: >60 mL/min (ref >60)
Glucose, Bld: 97 mg/dL (ref 70–99)
Potassium: 3.3 mmol/L — ABNORMAL LOW (ref 3.5–5.1)
Sodium: 142 mmol/L (ref 135–145)
Total Bilirubin: 0.3 mg/dL (ref 0.3–1.2)
Total Protein: 6.4 g/dL — ABNORMAL LOW (ref 6.5–8.1)

## 2020-10-19 LAB — IRON AND TIBC
Iron: 68 ug/dL (ref 41–142)
Saturation Ratios: 20 % — ABNORMAL LOW (ref 21–57)
TIBC: 341 ug/dL (ref 236–444)
UIBC: 274 ug/dL (ref 120–384)

## 2020-10-19 LAB — LACTATE DEHYDROGENASE: LDH: 367 U/L — ABNORMAL HIGH (ref 98–192)

## 2020-10-19 MED ORDER — DIPHENHYDRAMINE HCL 25 MG PO CAPS
50.0000 mg | ORAL_CAPSULE | Freq: Once | ORAL | Status: AC
  Administered 2020-10-19: 50 mg via ORAL
  Filled 2020-10-19: qty 2

## 2020-10-19 MED ORDER — SODIUM CHLORIDE 0.9 % IV SOLN
2.0000 mg/kg | Freq: Once | INTRAVENOUS | Status: AC
  Administered 2020-10-19: 155 mg via INTRAVENOUS
  Filled 2020-10-19: qty 3.1

## 2020-10-19 MED ORDER — ACETAMINOPHEN 325 MG PO TABS
650.0000 mg | ORAL_TABLET | Freq: Once | ORAL | Status: AC
  Administered 2020-10-19: 650 mg via ORAL
  Filled 2020-10-19: qty 2

## 2020-10-19 MED ORDER — HEPARIN SOD (PORK) LOCK FLUSH 100 UNIT/ML IV SOLN
500.0000 [IU] | Freq: Once | INTRAVENOUS | Status: AC | PRN
  Administered 2020-10-19: 500 [IU]

## 2020-10-19 MED ORDER — SODIUM CHLORIDE 0.9% FLUSH
10.0000 mL | INTRAVENOUS | Status: DC | PRN
  Administered 2020-10-19: 10 mL

## 2020-10-19 MED ORDER — PROCHLORPERAZINE MALEATE 10 MG PO TABS
10.0000 mg | ORAL_TABLET | Freq: Once | ORAL | Status: AC
  Administered 2020-10-19: 10 mg via ORAL
  Filled 2020-10-19: qty 1

## 2020-10-19 MED ORDER — SODIUM CHLORIDE 0.9 % IV SOLN
Freq: Once | INTRAVENOUS | Status: AC
Start: 2020-10-19 — End: 2020-10-19

## 2020-10-19 NOTE — Patient Instructions (Signed)
Implanted Port Home Guide An implanted port is a device that is placed under the skin. It is usually placed in the chest. The device can be used to give IV medicine, to take blood, or for dialysis. You may have an implanted port if: You need IV medicine that would be irritating to the small veins in your hands or arms. You need IV medicines, such as antibiotics, for a long period of time. You need IV nutrition for a long period of time. You need dialysis. When you have a port, your health care provider can choose to use the port instead of veins in your arms for these procedures. You may have fewer limitations when using a port than you would if you used other types of long-term IVs, and you will likely be able to return to normal activities after your incision heals. An implanted port has two main parts: Reservoir. The reservoir is the part where a needle is inserted to give medicines or draw blood. The reservoir is round. After it is placed, it appears as a small, raised area under your skin. Catheter. The catheter is a thin, flexible tube that connects the reservoir to a vein. Medicine that is inserted into the reservoir goes into the catheter and then into the vein. How is my port accessed? To access your port: A numbing cream may be placed on the skin over the port site. Your health care provider will put on a mask and sterile gloves. The skin over your port will be cleaned carefully with a germ-killing soap and allowed to dry. Your health care provider will gently pinch the port and insert a needle into it. Your health care provider will check for a blood return to make sure the port is in the vein and is not clogged. If your port needs to remain accessed to get medicine continuously (constant infusion), your health care provider will place a clear bandage (dressing) over the needle site. The dressing and needle will need to be changed every week, or as told by your health care provider. What  is flushing? Flushing helps keep the port from getting clogged. Follow instructions from your health care provider about how and when to flush the port. Ports are usually flushed with saline solution or a medicine called heparin. The need for flushing will depend on how the port is used: If the port is only used from time to time to give medicines or draw blood, the port may need to be flushed: Before and after medicines have been given. Before and after blood has been drawn. As part of routine maintenance. Flushing may be recommended every 4-6 weeks. If a constant infusion is running, the port may not need to be flushed. Throw away any syringes in a disposal container that is meant for sharp items (sharps container). You can buy a sharps container from a pharmacy, or you can make one by using an empty hard plastic bottle with a cover. How long will my port stay implanted? The port can stay in for as long as your health care provider thinks it is needed. When it is time for the port to come out, a surgery will be done to remove it. The surgery will be similar to the procedure that was done to put the port in. Follow these instructions at home:  Flush your port as told by your health care provider. If you need an infusion over several days, follow instructions from your health care provider about how   to take care of your port site. Make sure you: Wash your hands with soap and water before you change your dressing. If soap and water are not available, use alcohol-based hand sanitizer. Change your dressing as told by your health care provider. Place any used dressings or infusion bags into a plastic bag. Throw that bag in the trash. Keep the dressing that covers the needle clean and dry. Do not get it wet. Do not use scissors or sharp objects near the tube. Keep the tube clamped, unless it is being used. Check your port site every day for signs of infection. Check for: Redness, swelling, or  pain. Fluid or blood. Pus or a bad smell. Protect the skin around the port site. Avoid wearing bra straps that rub or irritate the site. Protect the skin around your port from seat belts. Place a soft pad over your chest if needed. Bathe or shower as told by your health care provider. The site may get wet as long as you are not actively receiving an infusion. Return to your normal activities as told by your health care provider. Ask your health care provider what activities are safe for you. Carry a medical alert card or wear a medical alert bracelet at all times. This will let health care providers know that you have an implanted port in case of an emergency. Get help right away if: You have redness, swelling, or pain at the port site. You have fluid or blood coming from your port site. You have pus or a bad smell coming from the port site. You have a fever. Summary Implanted ports are usually placed in the chest for long-term IV access. Follow instructions from your health care provider about flushing the port and changing bandages (dressings). Take care of the area around your port by avoiding clothing that puts pressure on the area, and by watching for signs of infection. Protect the skin around your port from seat belts. Place a soft pad over your chest if needed. Get help right away if you have a fever or you have redness, swelling, pain, drainage, or a bad smell at the port site. This information is not intended to replace advice given to you by your health care provider. Make sure you discuss any questions you have with your health care provider. Document Revised: 04/07/2020 Document Reviewed: 06/02/2019 Elsevier Patient Education  2022 [employer]  

## 2020-10-19 NOTE — Progress Notes (Signed)
[facility] exam notes reviewed with Dr Marin Olp.  Patient status form completed and Auth code generated.  [id number]

## 2020-10-19 NOTE — Progress Notes (Signed)
Hematology and Oncology Follow Up Visit  Emily Greer [id number] 14-Sep-1950 70 y.o. 10/19/2020   Principle Diagnosis:  Recurrent lambda light chain myeloma - nl cytogenetics History of recurrent endometrial carcinoma Iron deficiency anemia -blood loss   Past Therapy:             Status post second autologous stem cell transplant on 07/24/2014 Maintenance therapy with Pomalidomide/every 2 week Velcade - d/c'ed Radiation therapy for endometrial recurrence - completed 04/20/2015 Pomalyst/Kyprolis 70mg /m2 IV q 2 weeks - s/p cycle #12 - held on 12/26/2017 for hematuria Daratumumab/Pomalyst (1 mg) - status post cycle 19 -- d/c on 08/21/2019 Melflufen 40 mg IV q 4 weeks -- started on 08/27/2019, s/p cycle #2 --  D/c due to FDA removal Selinexor 60 mg po q week -- start on 01/19/2020 -- changed on 03/18/2020 -- d/c on 04/27/2020   Current Therapy:        Blenrep 2.5 mg/m2 IV q 3 weeks -- started on 05/20/2020, s/p cycle #6 Femara 2.5 mg po q day  Xgeva 120 mg subcu every 3 months - next dose in 10/2020  IV iron as indicated   Interim History:  Emily Greer is here today for follow-up and treatment.  Overall, this has been doing pretty well.  She does have problems with cataracts.  I do not think that this is anything related to the [facility] that she has been taking.  She is being followed closely by ophthalmology.  It sounds like she may need to have cataract surgery.  I would not think that this would be a problem.  The Blenrep is still working.  The lambda  light chain that we follow are still coming down.  When we last saw her back in August, the lambda light chain was down to 4.9 mg/dL.  Her blood counts are coming up slowly.  Again I think this might be an indicator that things are working and that the myeloma is decreasing her bone marrow.  She has had no problems with bowels or bladder.  She has had no cough or shortness of breath.  She has had no issues with nausea or  vomiting.  She does have some fatigue.  I am not surprised by this with all the treatment that she has had.  There is been no issues with fever.  She has had no rashes.  Overall, I would say her performance status is ECOG 1.    Medications:  Allergies as of 10/19/2020       Reactions   Codeine Nausea Only        Medication List        Accurate as of October 19, 2020  2:42 PM. If you have any questions, ask your nurse or doctor.          acyclovir 400 MG tablet Commonly known as: ZOVIRAX TAKE 1 TABLET(400 MG) BY MOUTH TWICE DAILY   albuterol 108 (90 Base) MCG/ACT inhaler Commonly known as: VENTOLIN HFA Inhale 2 puffs into the lungs every 6 (six) hours as needed for wheezing or shortness of breath. 2 puffs 3 times daily x 5 days then every 6 hours as needed.   amLODipine 10 MG tablet Commonly known as: NORVASC TAKE 1 TABLET(10 MG) BY MOUTH EVERY MORNING   aspirin EC 81 MG tablet Take 81 mg by mouth daily.   benzonatate 100 MG capsule Commonly known as: Tessalon Perles Take 1 capsule (100 mg total) by mouth 3 (three) times daily as needed for  cough.   CALCIUM 1200+D3 PO Take 1 tablet by mouth daily.   Carboxymethylcell-Glycerin PF 0.5-0.9 % Soln Place 2 drops into both eyes 4 (four) times daily.   cetirizine 10 MG tablet Commonly known as: ZYRTEC Take 1 tablet (10 mg total) by mouth at bedtime.   Darzalex 100 MG/5ML Generic drug: daratumumab   Fluocinolone Acetonide Scalp 0.01 % Oil Apply topically.   guaiFENesin-dextromethorphan 100-10 MG/5ML syrup Commonly known as: ROBITUSSIN DM Take 5 mLs by mouth every 4 (four) hours as needed for cough.   hydrochlorothiazide 12.5 MG capsule Commonly known as: MICROZIDE Take 12.5 mg by mouth every morning.   letrozole 2.5 MG tablet Commonly known as: FEMARA Take 1 tablet (2.5 mg total) by mouth daily.   lidocaine-prilocaine cream Commonly known as: EMLA Apply 1 application topically as needed.    loperamide 2 MG capsule Commonly known as: IMODIUM Take by mouth as needed for diarrhea or loose stools. Reported on 08/19/2015   loratadine 10 MG tablet Commonly known as: CLARITIN Take 10 mg by mouth every morning. Reported on 08/19/2015   metoprolol succinate 50 MG 24 hr tablet Commonly known as: TOPROL-XL TAKE 1 TABLET(50 MG) BY MOUTH EVERY MORNING   Minoxidil for Men 5 % Soln Generic drug: MINOXIDIL (TOPICAL) SMARTSIG:Sparingly Topical Every Night   Rogaine Mens 5 % Foam Generic drug: Minoxidil See admin instructions.   montelukast 10 MG tablet Commonly known as: SINGULAIR TAKE 1 TABLET(10 MG) BY MOUTH AT BEDTIME   ondansetron 8 MG tablet Commonly known as: Zofran Take 1 tablet (8 mg total) by mouth 2 (two) times daily as needed (Nausea or vomiting).   potassium chloride SA 20 MEQ tablet Commonly known as: KLOR-CON TAKE 1 TABLET(20 MEQ) BY MOUTH TWICE DAILY   PROBIOTIC DAILY PO Take by mouth daily.   triamcinolone 55 MCG/ACT Aero nasal inhaler Commonly known as: NASACORT Place 2 sprays into the nose as needed.   triamterene-hydrochlorothiazide 37.5-25 MG tablet Commonly known as: MAXZIDE-25 1 tablet by mouth at bedtime   Vitamin D3 50 MCG (2000 UT) Tabs Take 2 tablets by mouth daily.   Xiidra 5 % Soln Generic drug: Lifitegrast   Zinc 50 MG Tabs 1 tablet   zinc gluconate 50 MG tablet Take 50 mg by mouth daily.        Allergies:  Allergies  Allergen Reactions   Codeine Nausea Only    Past Medical History, Surgical history, Social history, and Family History were reviewed and updated.  Review of Systems: Review of Systems  Constitutional: Negative.   HENT: Negative.    Eyes:  Positive for blurred vision.  Respiratory: Negative.    Cardiovascular: Negative.   Gastrointestinal: Negative.   Genitourinary: Negative.   Musculoskeletal: Negative.   Skin: Negative.   Neurological: Negative.   Endo/Heme/Allergies: Negative.    Psychiatric/Behavioral: Negative.      Physical Exam:  weight is 173 lb 8 oz (78.7 kg). Her oral temperature is 98 F (36.7 C). Her blood pressure is 134/94 (abnormal) and her pulse is 91. Her respiration is 19 and oxygen saturation is 99%.   Wt Readings from Last 3 Encounters:  10/19/20 173 lb 8 oz (78.7 kg)  09/29/20 174 lb (78.9 kg)  09/24/20 168 lb 1.9 oz (76.3 kg)    Physical Exam Vitals reviewed.  HENT:     Head: Normocephalic and atraumatic.  Eyes:     Pupils: Pupils are equal, round, and reactive to light.  Cardiovascular:     Rate and Rhythm:  Normal rate and regular rhythm.     Heart sounds: Normal heart sounds.  Pulmonary:     Effort: Pulmonary effort is normal.     Breath sounds: Normal breath sounds.  Abdominal:     General: Bowel sounds are normal.     Palpations: Abdomen is soft.  Musculoskeletal:        General: No tenderness or deformity. Normal range of motion.     Cervical back: Normal range of motion.  Lymphadenopathy:     Cervical: No cervical adenopathy.  Skin:    General: Skin is warm and dry.     Findings: No erythema or rash.  Neurological:     Mental Status: She is alert and oriented to person, place, and time.  Psychiatric:        Behavior: Behavior normal.        Thought Content: Thought content normal.        Judgment: Judgment normal.     Lab Results  Component Value Date   WBC 5.0 10/19/2020   HGB 10.5 (L) 10/19/2020   HCT 34.2 (L) 10/19/2020   MCV 77.2 (L) 10/19/2020   PLT 158 10/19/2020   Lab Results  Component Value Date   FERRITIN 4,970 (H) 09/24/2020   IRON 68 10/19/2020   TIBC 341 10/19/2020   UIBC 274 10/19/2020   IRONPCTSAT 20 (L) 10/19/2020   Lab Results  Component Value Date   RETICCTPCT 1.6 11/05/2019   RBC 4.43 10/19/2020   Lab Results  Component Value Date   KPAFRELGTCHN 3.7 09/24/2020   LAMBDASER 49.3 (H) 09/24/2020   KAPLAMBRATIO 0.08 (L) 09/24/2020   Lab Results  Component Value Date   IGGSERUM  357 (L) 09/24/2020   IGA 11 (L) 09/24/2020   IGMSERUM <5 (L) 09/24/2020   Lab Results  Component Value Date   TOTALPROTELP 5.5 (L) 09/24/2020   ALBUMINELP 3.3 09/24/2020   A1GS 0.2 09/24/2020   A2GS 0.9 09/24/2020   BETS 0.9 09/24/2020   BETA2SER 0.4 11/23/2014   GAMS 0.2 (L) 09/24/2020   MSPIKE Not Observed 09/24/2020   SPEI Comment 09/24/2020     Chemistry      Component Value Date/Time   NA 142 10/19/2020 0844   NA 141 01/10/2017 1115   NA 140 06/21/2016 0918   K 3.3 (L) 10/19/2020 0844   K 4.0 01/10/2017 1115   K 4.3 06/21/2016 0918   CL 104 10/19/2020 0844   CL 106 01/10/2017 1115   CO2 29 10/19/2020 0844   CO2 27 01/10/2017 1115   CO2 20 (L) 06/21/2016 0918   BUN 12 10/19/2020 0844   BUN 15 01/10/2017 1115   BUN 15.8 06/21/2016 0918   CREATININE 0.77 10/19/2020 0844   CREATININE 1.0 01/10/2017 1115   CREATININE 0.8 06/21/2016 0918      Component Value Date/Time   CALCIUM 9.3 10/19/2020 0844   CALCIUM 9.5 01/10/2017 1115   CALCIUM 9.4 06/21/2016 0918   ALKPHOS 69 10/19/2020 0844   ALKPHOS 40 01/10/2017 1115   ALKPHOS 66 06/21/2016 0918   AST 26 10/19/2020 0844   AST 17 06/21/2016 0918   ALT 23 10/19/2020 0844   ALT 19 01/10/2017 1115   ALT 37 06/21/2016 0918   BILITOT 0.3 10/19/2020 0844   BILITOT 0.32 06/21/2016 0918       Impression and Plan: Emily Greer is a very pleasant 70 yo African American female with recurrent lambda light chain myeloma.  She had her second stem cell transplant for  light chain myeloma was in June 2016.   She is doing well on the Blenrep.  We will see what her lambda light chain levels look like.  Again, I do not think that there will be a problem with respect to the cataract she needs to have surgery for this.  We will plan for another follow-up in about 3 weeks.  I am just glad that her quality of life is doing well.   Josph Macho, MD 9/20/20222:42 PM

## 2020-10-19 NOTE — Patient Instructions (Signed)
[facility] [place]  Discharge Instructions: Thank you for choosing [facility] to provide your oncology and hematology care.   If you have a lab appointment with the [facility], please go directly to the [facility] and check in at the registration area.  Wear comfortable clothing and clothing appropriate for easy access to any Portacath or PICC line.   We strive to give you quality time with your provider. You may need to reschedule your appointment if you arrive late (15 or more minutes).  Arriving late affects you and other patients whose appointments are after yours.  Also, if you miss three or more appointments without notifying the office, you may be dismissed from the clinic at the provider's discretion.      For prescription refill requests, have your pharmacy contact our office and allow 72 hours for refills to be completed.    Today you received the following chemotherapy and/or immunotherapy agents : Blenrep      To help prevent nausea and vomiting after your treatment, we encourage you to take your nausea medication as directed.  BELOW ARE SYMPTOMS THAT SHOULD BE REPORTED IMMEDIATELY: *FEVER GREATER THAN 100.4 F (38 C) OR HIGHER *CHILLS OR SWEATING *NAUSEA AND VOMITING THAT IS NOT CONTROLLED WITH YOUR NAUSEA MEDICATION *UNUSUAL SHORTNESS OF BREATH *UNUSUAL BRUISING OR BLEEDING *URINARY PROBLEMS (pain or burning when urinating, or frequent urination) *BOWEL PROBLEMS (unusual diarrhea, constipation, pain near the anus) TENDERNESS IN MOUTH AND THROAT WITH OR WITHOUT PRESENCE OF ULCERS (sore throat, sores in mouth, or a toothache) UNUSUAL RASH, SWELLING OR PAIN  UNUSUAL VAGINAL DISCHARGE OR ITCHING   Items with * indicate a potential emergency and should be followed up as soon as possible or go to the Emergency Department if any problems should occur.  Please show the CHEMOTHERAPY ALERT CARD or IMMUNOTHERAPY ALERT CARD at check-in to the  Emergency Department and triage nurse. Should you have questions after your visit or need to cancel or reschedule your appointment, please contact [facility]  [phone] and follow the prompts.  Office hours are 8:00 a.m. to 4:30 p.m. Monday - Friday. Please note that voicemails left after 4:00 p.m. may not be returned until the following business day.  We are closed weekends and major holidays. You have access to a nurse at all times for urgent questions. Please call the main number to the clinic [phone] and follow the prompts.  For any non-urgent questions, you may also contact your provider using MyChart. We now offer e-Visits for anyone 68 and older to request care online for non-urgent symptoms. For details visit [email or web address].   Also download the MyChart app! Go to the app store, search "MyChart", open the app, select [facility], and log in with your MyChart username and password.  Due to Covid, a mask is required upon entering the hospital/clinic. If you do not have a mask, one will be given to you upon arrival. For doctor visits, patients may have 1 support person aged 68 or older with them. For treatment visits, patients cannot have anyone with them due to current Covid guidelines and our immunocompromised population.

## 2020-10-20 ENCOUNTER — Telehealth: Payer: Self-pay

## 2020-10-20 ENCOUNTER — Encounter: Payer: Self-pay | Admitting: *Deleted

## 2020-10-20 LAB — KAPPA/LAMBDA LIGHT CHAINS
Kappa free light chain: 3.4 mg/L (ref 3.3–19.4)
Kappa, lambda light chain ratio: 0.07 — ABNORMAL LOW (ref 0.26–1.65)
Lambda free light chains: 50.8 mg/L — ABNORMAL HIGH (ref 5.7–26.3)

## 2020-10-20 NOTE — Progress Notes (Signed)
Blenrep REMS checklist submitted regarding yesterdays Blenrep infusion

## 2020-10-21 LAB — IGG, IGA, IGM
IgA: 6 mg/dL — ABNORMAL LOW (ref 87–352)
IgG (Immunoglobin G), Serum: 340 mg/dL — ABNORMAL LOW (ref 586–1602)
IgM (Immunoglobulin M), Srm: <5 mg/dL — ABNORMAL LOW (ref 26–217)

## 2020-10-23 LAB — PROTEIN ELECTROPHORESIS, SERUM
A/G Ratio: 1.6 (ref 0.7–1.7)
Albumin ELP: 3.6 g/dL (ref 2.9–4.4)
Alpha-1-Globulin: 0.2 g/dL (ref 0.0–0.4)
Alpha-2-Globulin: 0.8 g/dL (ref 0.4–1.0)
Beta Globulin: 1 g/dL (ref 0.7–1.3)
Gamma Globulin: 0.2 g/dL — ABNORMAL LOW (ref 0.4–1.8)
Globulin, Total: 2.3 g/dL (ref 2.2–3.9)
Total Protein ELP: 5.9 g/dL — ABNORMAL LOW (ref 6.0–8.5)

## 2020-10-25 ENCOUNTER — Telehealth: Payer: Self-pay | Admitting: Hematology & Oncology

## 2020-10-25 NOTE — Telephone Encounter (Signed)
Scheduled appt per 9/23 sch msg - patient is aware of appt scheduled on 10/26/20

## 2020-10-26 ENCOUNTER — Inpatient Hospital Stay: Payer: Medicare Other

## 2020-10-26 ENCOUNTER — Other Ambulatory Visit: Payer: Self-pay

## 2020-10-26 VITALS — BP 144/75 | HR 84 | Temp 98.2°F | Resp 18

## 2020-10-26 DIAGNOSIS — C541 Malignant neoplasm of endometrium: Secondary | ICD-10-CM

## 2020-10-26 DIAGNOSIS — C9000 Multiple myeloma not having achieved remission: Secondary | ICD-10-CM | POA: Diagnosis not present

## 2020-10-26 DIAGNOSIS — H538 Other visual disturbances: Secondary | ICD-10-CM | POA: Diagnosis not present

## 2020-10-26 DIAGNOSIS — R58 Hemorrhage, not elsewhere classified: Secondary | ICD-10-CM | POA: Diagnosis not present

## 2020-10-26 DIAGNOSIS — R5383 Other fatigue: Secondary | ICD-10-CM | POA: Diagnosis not present

## 2020-10-26 DIAGNOSIS — C9001 Multiple myeloma in remission: Secondary | ICD-10-CM

## 2020-10-26 DIAGNOSIS — D500 Iron deficiency anemia secondary to blood loss (chronic): Secondary | ICD-10-CM | POA: Diagnosis not present

## 2020-10-26 DIAGNOSIS — Z5112 Encounter for antineoplastic immunotherapy: Secondary | ICD-10-CM | POA: Diagnosis not present

## 2020-10-26 MED ORDER — HEPARIN SOD (PORK) LOCK FLUSH 100 UNIT/ML IV SOLN
500.0000 [IU] | Freq: Once | INTRAVENOUS | Status: AC | PRN
  Administered 2020-10-26: 500 [IU]

## 2020-10-26 MED ORDER — SODIUM CHLORIDE 0.9 % IV SOLN: Freq: Once | INTRAVENOUS | Status: AC

## 2020-10-26 MED ORDER — FERUMOXYTOL INJECTION 510 MG/17 ML
510.0000 mg | Freq: Once | INTRAVENOUS | Status: AC
  Administered 2020-10-26: 510 mg via INTRAVENOUS
  Filled 2020-10-26: qty 510

## 2020-10-26 MED ORDER — SODIUM CHLORIDE 0.9% FLUSH
10.0000 mL | Freq: Once | INTRAVENOUS | Status: AC | PRN
  Administered 2020-10-26: 10 mL

## 2020-10-26 NOTE — Patient Instructions (Signed)
Ferumoxytol injection What is this medication? FERUMOXYTOL is an iron complex. Iron is used to make healthy red blood cells, which carry oxygen and nutrients throughout the body. This medicine is used totreat iron deficiency anemia. This medicine may be used for other purposes; ask your health care provider orpharmacist if you have questions. COMMON BRAND NAME(S): Feraheme What should I tell my care team before I take this medication? They need to know if you have any of these conditions: anemia not caused by low iron levels high levels of iron in the blood magnetic resonance imaging (MRI) test scheduled an unusual or allergic reaction to iron, other medicines, foods, dyes, or preservatives pregnant or trying to get pregnant breast-feeding How should I use this medication? This medicine is for injection into a vein. It is given by a health careprofessional in a hospital or clinic setting. Talk to your pediatrician regarding the use of this medicine in children.Special care may be needed. Overdosage: If you think you have taken too much of this medicine contact apoison control center or emergency room at once. NOTE: This medicine is only for you. Do not share this medicine with others. What if I miss a dose? It is important not to miss your dose. Call your doctor or health careprofessional if you are unable to keep an appointment. What may interact with this medication? This medicine may interact with the following medications: other iron products This list may not describe all possible interactions. Give your health care provider a list of all the medicines, herbs, non-prescription drugs, or dietary supplements you use. Also tell them if you smoke, drink alcohol, or use illegaldrugs. Some items may interact with your medicine. What should I watch for while using this medication? Visit your doctor or healthcare professional regularly. Tell your doctor or healthcare professional if your  symptoms do not start to get better or if theyget worse. You may need blood work done while you are taking this medicine. You may need to follow a special diet. Talk to your doctor. Foods that contain iron include: whole grains/cereals, dried fruits, beans, or peas, leafy greenvegetables, and organ meats (liver, kidney). What side effects may I notice from receiving this medication? Side effects that you should report to your doctor or health care professionalas soon as possible: allergic reactions like skin rash, itching or hives, swelling of the face, lips, or tongue breathing problems changes in blood pressure feeling faint or lightheaded, falls fever or chills flushing, sweating, or hot feelings swelling of the ankles or feet Side effects that usually do not require medical attention (report to yourdoctor or health care professional if they continue or are bothersome): diarrhea headache nausea, vomiting stomach pain This list may not describe all possible side effects. Call your doctor for medical advice about side effects. You may report side effects to FDA at1-800-FDA-1088. Where should I keep my medication? This drug is given in a hospital or clinic and will not be stored at home. NOTE: This sheet is a summary. It may not cover all possible information. If you have questions about this medicine, talk to your doctor, pharmacist, orhealth care provider.  2022 Elsevier/Gold Standard (2016-03-06 20:21:10)  

## 2020-10-29 DIAGNOSIS — H04223 Epiphora due to insufficient drainage, bilateral lacrimal glands: Secondary | ICD-10-CM | POA: Diagnosis not present

## 2020-10-29 DIAGNOSIS — H40033 Anatomical narrow angle, bilateral: Secondary | ICD-10-CM | POA: Diagnosis not present

## 2020-10-29 DIAGNOSIS — H25813 Combined forms of age-related cataract, bilateral: Secondary | ICD-10-CM | POA: Diagnosis not present

## 2020-10-29 DIAGNOSIS — H16143 Punctate keratitis, bilateral: Secondary | ICD-10-CM | POA: Diagnosis not present

## 2020-11-01 ENCOUNTER — Encounter: Payer: Self-pay | Admitting: *Deleted

## 2020-11-01 NOTE — Progress Notes (Signed)
Recd 10/29/20 office visit from Dr Jerline Pain at [facility].  Patient status form completed and Authorization code generated for next treatment. Reviewed all with Dr Marin Olp

## 2020-11-10 ENCOUNTER — Inpatient Hospital Stay: Payer: Medicare Other

## 2020-11-10 ENCOUNTER — Other Ambulatory Visit: Payer: Self-pay | Admitting: *Deleted

## 2020-11-10 ENCOUNTER — Inpatient Hospital Stay: Payer: Medicare Other | Attending: Hematology & Oncology

## 2020-11-10 ENCOUNTER — Encounter: Payer: Self-pay | Admitting: Hematology & Oncology

## 2020-11-10 ENCOUNTER — Inpatient Hospital Stay (HOSPITAL_BASED_OUTPATIENT_CLINIC_OR_DEPARTMENT_OTHER): Payer: Medicare Other | Admitting: Hematology & Oncology

## 2020-11-10 ENCOUNTER — Other Ambulatory Visit: Payer: Self-pay

## 2020-11-10 VITALS — BP 148/75 | HR 77 | Temp 97.9°F | Resp 17 | Ht 64.0 in | Wt 173.0 lb

## 2020-11-10 DIAGNOSIS — D500 Iron deficiency anemia secondary to blood loss (chronic): Secondary | ICD-10-CM | POA: Diagnosis not present

## 2020-11-10 DIAGNOSIS — Z9484 Stem cells transplant status: Secondary | ICD-10-CM | POA: Insufficient documentation

## 2020-11-10 DIAGNOSIS — C9000 Multiple myeloma not having achieved remission: Secondary | ICD-10-CM | POA: Diagnosis not present

## 2020-11-10 DIAGNOSIS — Z923 Personal history of irradiation: Secondary | ICD-10-CM | POA: Diagnosis not present

## 2020-11-10 DIAGNOSIS — R58 Hemorrhage, not elsewhere classified: Secondary | ICD-10-CM | POA: Insufficient documentation

## 2020-11-10 DIAGNOSIS — H269 Unspecified cataract: Secondary | ICD-10-CM | POA: Insufficient documentation

## 2020-11-10 DIAGNOSIS — Z8542 Personal history of malignant neoplasm of other parts of uterus: Secondary | ICD-10-CM | POA: Insufficient documentation

## 2020-11-10 DIAGNOSIS — Z885 Allergy status to narcotic agent status: Secondary | ICD-10-CM | POA: Diagnosis not present

## 2020-11-10 DIAGNOSIS — Z79899 Other long term (current) drug therapy: Secondary | ICD-10-CM | POA: Insufficient documentation

## 2020-11-10 DIAGNOSIS — Z5112 Encounter for antineoplastic immunotherapy: Secondary | ICD-10-CM | POA: Diagnosis not present

## 2020-11-10 LAB — CBC WITH DIFFERENTIAL (CANCER CENTER ONLY)
Abs Immature Granulocytes: 0.05 10*3/uL (ref 0.00–0.07)
Basophils Absolute: 0.1 10*3/uL (ref 0.0–0.1)
Basophils Relative: 1 %
Eosinophils Absolute: 0.1 10*3/uL (ref 0.0–0.5)
Eosinophils Relative: 2 %
HCT: 33.3 % — ABNORMAL LOW (ref 36.0–46.0)
Hemoglobin: 10.5 g/dL — ABNORMAL LOW (ref 12.0–15.0)
Immature Granulocytes: 1 %
Lymphocytes Relative: 13 %
Lymphs Abs: 0.6 10*3/uL — ABNORMAL LOW (ref 0.7–4.0)
MCH: 24.5 pg — ABNORMAL LOW (ref 26.0–34.0)
MCHC: 31.5 g/dL (ref 30.0–36.0)
MCV: 77.6 fL — ABNORMAL LOW (ref 80.0–100.0)
Monocytes Absolute: 0.6 10*3/uL (ref 0.1–1.0)
Monocytes Relative: 13 %
Neutro Abs: 3.3 10*3/uL (ref 1.7–7.7)
Neutrophils Relative %: 70 %
Platelet Count: 133 10*3/uL — ABNORMAL LOW (ref 150–400)
RBC: 4.29 MIL/uL (ref 3.87–5.11)
RDW: 21.7 % — ABNORMAL HIGH (ref 11.5–15.5)
WBC Count: 4.7 10*3/uL (ref 4.0–10.5)
nRBC: 0 % (ref 0.0–0.2)

## 2020-11-10 LAB — CMP (CANCER CENTER ONLY)
ALT: 17 U/L (ref 0–44)
AST: 23 U/L (ref 15–41)
Albumin: 4.2 g/dL (ref 3.5–5.0)
Alkaline Phosphatase: 62 U/L (ref 38–126)
Anion gap: 10 (ref 5–15)
BUN: 12 mg/dL (ref 8–23)
CO2: 28 mmol/L (ref 22–32)
Calcium: 10 mg/dL (ref 8.9–10.3)
Chloride: 104 mmol/L (ref 98–111)
Creatinine: 0.77 mg/dL (ref 0.44–1.00)
GFR, Estimated: >60 mL/min (ref >60)
Glucose, Bld: 100 mg/dL — ABNORMAL HIGH (ref 70–99)
Potassium: 3.3 mmol/L — ABNORMAL LOW (ref 3.5–5.1)
Sodium: 142 mmol/L (ref 135–145)
Total Bilirubin: 0.3 mg/dL (ref 0.3–1.2)
Total Protein: 6.3 g/dL — ABNORMAL LOW (ref 6.5–8.1)

## 2020-11-10 LAB — LACTATE DEHYDROGENASE: LDH: 378 U/L — ABNORMAL HIGH (ref 98–192)

## 2020-11-10 MED ORDER — SODIUM CHLORIDE 0.9 % IV SOLN
2.0000 mg/kg | Freq: Once | INTRAVENOUS | Status: AC
  Administered 2020-11-10: 155 mg via INTRAVENOUS
  Filled 2020-11-10: qty 3.1

## 2020-11-10 MED ORDER — SODIUM CHLORIDE 0.9% FLUSH
10.0000 mL | INTRAVENOUS | Status: DC | PRN
  Administered 2020-11-10: 10 mL

## 2020-11-10 MED ORDER — PROCHLORPERAZINE MALEATE 10 MG PO TABS
10.0000 mg | ORAL_TABLET | Freq: Once | ORAL | Status: AC
  Administered 2020-11-10: 10 mg via ORAL
  Filled 2020-11-10: qty 1

## 2020-11-10 MED ORDER — DIPHENHYDRAMINE HCL 25 MG PO CAPS
50.0000 mg | ORAL_CAPSULE | Freq: Once | ORAL | Status: AC
  Administered 2020-11-10: 50 mg via ORAL
  Filled 2020-11-10: qty 2

## 2020-11-10 MED ORDER — HEPARIN SOD (PORK) LOCK FLUSH 100 UNIT/ML IV SOLN
500.0000 [IU] | Freq: Once | INTRAVENOUS | Status: AC | PRN
  Administered 2020-11-10: 500 [IU]

## 2020-11-10 MED ORDER — ACETAMINOPHEN 325 MG PO TABS
650.0000 mg | ORAL_TABLET | Freq: Once | ORAL | Status: AC
  Administered 2020-11-10: 650 mg via ORAL
  Filled 2020-11-10: qty 2

## 2020-11-10 MED ORDER — SODIUM CHLORIDE 0.9 % IV SOLN: Freq: Once | INTRAVENOUS | Status: AC

## 2020-11-10 MED ORDER — LIDOCAINE-PRILOCAINE 2.5-2.5 % EX CREA
1.0000 | TOPICAL_CREAM | CUTANEOUS | 3 refills | Status: DC | PRN
Start: 2020-11-10 — End: 2021-07-14

## 2020-11-10 NOTE — Patient Instructions (Signed)
[facility] [place]  Discharge Instructions: Thank you for choosing [facility] to provide your oncology and hematology care.   If you have a lab appointment with the [facility], please go directly to the [facility] and check in at the registration area.  Wear comfortable clothing and clothing appropriate for easy access to any Portacath or PICC line.   We strive to give you quality time with your provider. You may need to reschedule your appointment if you arrive late (15 or more minutes).  Arriving late affects you and other patients whose appointments are after yours.  Also, if you miss three or more appointments without notifying the office, you may be dismissed from the clinic at the provider's discretion.      For prescription refill requests, have your pharmacy contact our office and allow 72 hours for refills to be completed.    Today you received the following chemotherapy and/or immunotherapy agents : Blenrep      To help prevent nausea and vomiting after your treatment, we encourage you to take your nausea medication as directed.  BELOW ARE SYMPTOMS THAT SHOULD BE REPORTED IMMEDIATELY: *FEVER GREATER THAN 100.4 F (38 C) OR HIGHER *CHILLS OR SWEATING *NAUSEA AND VOMITING THAT IS NOT CONTROLLED WITH YOUR NAUSEA MEDICATION *UNUSUAL SHORTNESS OF BREATH *UNUSUAL BRUISING OR BLEEDING *URINARY PROBLEMS (pain or burning when urinating, or frequent urination) *BOWEL PROBLEMS (unusual diarrhea, constipation, pain near the anus) TENDERNESS IN MOUTH AND THROAT WITH OR WITHOUT PRESENCE OF ULCERS (sore throat, sores in mouth, or a toothache) UNUSUAL RASH, SWELLING OR PAIN  UNUSUAL VAGINAL DISCHARGE OR ITCHING   Items with * indicate a potential emergency and should be followed up as soon as possible or go to the Emergency Department if any problems should occur.  Please show the CHEMOTHERAPY ALERT CARD or IMMUNOTHERAPY ALERT CARD at check-in to the  Emergency Department and triage nurse. Should you have questions after your visit or need to cancel or reschedule your appointment, please contact [facility]  [phone] and follow the prompts.  Office hours are 8:00 a.m. to 4:30 p.m. Monday - Friday. Please note that voicemails left after 4:00 p.m. may not be returned until the following business day.  We are closed weekends and major holidays. You have access to a nurse at all times for urgent questions. Please call the main number to the clinic [phone] and follow the prompts.  For any non-urgent questions, you may also contact your provider using MyChart. We now offer e-Visits for anyone 84 and older to request care online for non-urgent symptoms. For details visit [email or web address].   Also download the MyChart app! Go to the app store, search "MyChart", open the app, select [facility], and log in with your MyChart username and password.  Due to Covid, a mask is required upon entering the hospital/clinic. If you do not have a mask, one will be given to you upon arrival. For doctor visits, patients may have 1 support person aged 78 or older with them. For treatment visits, patients cannot have anyone with them due to current Covid guidelines and our immunocompromised population.
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
## 2020-11-10 NOTE — Patient Instructions (Signed)
Implanted Port Home Guide An implanted port is a device that is placed under the skin. It is usually placed in the chest. The device can be used to give IV medicine, to take blood, or for dialysis. You may have an implanted port if: You need IV medicine that would be irritating to the small veins in your hands or arms. You need IV medicines, such as antibiotics, for a long period of time. You need IV nutrition for a long period of time. You need dialysis. When you have a port, your health care provider can choose to use the port instead of veins in your arms for these procedures. You may have fewer limitations when using a port than you would if you used other types of long-term IVs, and you will likely be able to return to normal activities after your incision heals. An implanted port has two main parts: Reservoir. The reservoir is the part where a needle is inserted to give medicines or draw blood. The reservoir is round. After it is placed, it appears as a small, raised area under your skin. Catheter. The catheter is a thin, flexible tube that connects the reservoir to a vein. Medicine that is inserted into the reservoir goes into the catheter and then into the vein. How is my port accessed? To access your port: A numbing cream may be placed on the skin over the port site. Your health care provider will put on a mask and sterile gloves. The skin over your port will be cleaned carefully with a germ-killing soap and allowed to dry. Your health care provider will gently pinch the port and insert a needle into it. Your health care provider will check for a blood return to make sure the port is in the vein and is not clogged. If your port needs to remain accessed to get medicine continuously (constant infusion), your health care provider will place a clear bandage (dressing) over the needle site. The dressing and needle will need to be changed every week, or as told by your health care provider. What  is flushing? Flushing helps keep the port from getting clogged. Follow instructions from your health care provider about how and when to flush the port. Ports are usually flushed with saline solution or a medicine called heparin. The need for flushing will depend on how the port is used: If the port is only used from time to time to give medicines or draw blood, the port may need to be flushed: Before and after medicines have been given. Before and after blood has been drawn. As part of routine maintenance. Flushing may be recommended every 4-6 weeks. If a constant infusion is running, the port may not need to be flushed. Throw away any syringes in a disposal container that is meant for sharp items (sharps container). You can buy a sharps container from a pharmacy, or you can make one by using an empty hard plastic bottle with a cover. How long will my port stay implanted? The port can stay in for as long as your health care provider thinks it is needed. When it is time for the port to come out, a surgery will be done to remove it. The surgery will be similar to the procedure that was done to put the port in. Follow these instructions at home:  Flush your port as told by your health care provider. If you need an infusion over several days, follow instructions from your health care provider about how   to take care of your port site. Make sure you: Wash your hands with soap and water before you change your dressing. If soap and water are not available, use alcohol-based hand sanitizer. Change your dressing as told by your health care provider. Place any used dressings or infusion bags into a plastic bag. Throw that bag in the trash. Keep the dressing that covers the needle clean and dry. Do not get it wet. Do not use scissors or sharp objects near the tube. Keep the tube clamped, unless it is being used. Check your port site every day for signs of infection. Check for: Redness, swelling, or  pain. Fluid or blood. Pus or a bad smell. Protect the skin around the port site. Avoid wearing bra straps that rub or irritate the site. Protect the skin around your port from seat belts. Place a soft pad over your chest if needed. Bathe or shower as told by your health care provider. The site may get wet as long as you are not actively receiving an infusion. Return to your normal activities as told by your health care provider. Ask your health care provider what activities are safe for you. Carry a medical alert card or wear a medical alert bracelet at all times. This will let health care providers know that you have an implanted port in case of an emergency. Get help right away if: You have redness, swelling, or pain at the port site. You have fluid or blood coming from your port site. You have pus or a bad smell coming from the port site. You have a fever. Summary Implanted ports are usually placed in the chest for long-term IV access. Follow instructions from your health care provider about flushing the port and changing bandages (dressings). Take care of the area around your port by avoiding clothing that puts pressure on the area, and by watching for signs of infection. Protect the skin around your port from seat belts. Place a soft pad over your chest if needed. Get help right away if you have a fever or you have redness, swelling, pain, drainage, or a bad smell at the port site. This information is not intended to replace advice given to you by your health care provider. Make sure you discuss any questions you have with your health care provider. Document Revised: 04/07/2020 Document Reviewed: 06/02/2019 Elsevier Patient Education  2022 [employer]  

## 2020-11-11 ENCOUNTER — Encounter: Payer: Self-pay | Admitting: *Deleted

## 2020-11-11 LAB — KAPPA/LAMBDA LIGHT CHAINS
Kappa free light chain: 3.3 mg/L (ref 3.3–19.4)
Kappa, lambda light chain ratio: 0.07 — ABNORMAL LOW (ref 0.26–1.65)
Lambda free light chains: 46.9 mg/L — ABNORMAL HIGH (ref 5.7–26.3)

## 2020-11-11 LAB — IGG, IGA, IGM
IgA: 5 mg/dL — ABNORMAL LOW (ref 87–352)
IgG (Immunoglobin G), Serum: 316 mg/dL — ABNORMAL LOW (ref 586–1602)
IgM (Immunoglobulin M), Srm: <5 mg/dL — ABNORMAL LOW (ref 26–217)

## 2020-11-11 NOTE — Progress Notes (Signed)
Blenrep REMS information entered from infusion given on 11/10/20.

## 2020-11-12 LAB — PROTEIN ELECTROPHORESIS, SERUM, WITH REFLEX
A/G Ratio: 1.5 (ref 0.7–1.7)
Albumin ELP: 3.6 g/dL (ref 2.9–4.4)
Alpha-1-Globulin: 0.3 g/dL (ref 0.0–0.4)
Alpha-2-Globulin: 0.9 g/dL (ref 0.4–1.0)
Beta Globulin: 1.1 g/dL (ref 0.7–1.3)
Gamma Globulin: 0.2 g/dL — ABNORMAL LOW (ref 0.4–1.8)
Globulin, Total: 2.4 g/dL (ref 2.2–3.9)
Total Protein ELP: 6 g/dL (ref 6.0–8.5)

## 2020-11-18 ENCOUNTER — Other Ambulatory Visit: Payer: Self-pay

## 2020-11-18 ENCOUNTER — Ambulatory Visit (INDEPENDENT_AMBULATORY_CARE_PROVIDER_SITE_OTHER): Payer: Medicare Other | Admitting: Nurse Practitioner

## 2020-11-18 VITALS — BP 134/78 | HR 83 | Temp 98.2°F | Ht 63.4 in | Wt 174.2 lb

## 2020-11-18 DIAGNOSIS — N644 Mastodynia: Secondary | ICD-10-CM | POA: Diagnosis not present

## 2020-11-18 NOTE — Progress Notes (Signed)
I,Tianna Badgett,acting as a Neurosurgeon for [employer], NP.,have documented all relevant documentation on the behalf of [employer], NP,as directed by  Charlesetta Ivory, NP while in the presence of Charlesetta Ivory, NP.  This visit occurred during the SARS-CoV-2 public health emergency.  Safety protocols were in place, including screening questions prior to the visit, additional usage of staff PPE, and extensive cleaning of exam room while observing appropriate contact time as indicated for disinfecting solutions.  Subjective:     Patient ID: Emily Greer , female    DOB: 1950/03/18 , 70 y.o.   MRN: [id number]   No chief complaint on file.  HPI  Patient is here for pain that she is experiencing under her arm. She states that the pain started about a week ago. The pain is a sharp pain. She has never felt this type of pain. She has not had a mammogram this year.  She would like to get it further evaluated.     Past Medical History:  Diagnosis Date   Avascular necrosis of femoral head (HCC)    bilateral per CT 07-26-2015   Endometrial carcinoma [facility]) gyn oncologist-  dr Nelly Rout ([facility])/  radiation oncologist-- dr Roselind Messier   2013 dx  FIGO Stage 1A, Grade 2 endometrioid endometrial cancer s/p TAH w/ BSO and bilateral pelvic node dissection 10-31-2011 ;  recurrence at distal vagina 04/ 2014 s/p  brachytherapy (ended 07-29-2012);  2nd recurrence 12/ 2016  vaginal apex s/p  conformational radiotherapy 03-10-2015 to 04-20-2015   Family history of adverse reaction to anesthesia    mother ponv   GERD (gastroesophageal reflux disease)    Goals of care, counseling/discussion 08/21/2019   H/O stem cell transplant (HCC)    02/ 2000 and second one 06/ 2016   History of bacteremia    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   History of chemotherapy    last chemo 12-26-17   History of radiation therapy 6/4, 6/11, 6/19, 6/25, 07/29/2012   vagina 30.5 gray in 5  fx, HDR brachytherapy:   last radiation to vagina 03-10-2015 to 04-20-2015  50.4gray   History of radiation therapy 03/10/15-04/20/15   vagina 50.4 in 28 fractions   Hypertension    Lambda light chain myeloma [facility]) oncologist-  dr Myna Hidalgo ([facility])  and  [facility] -- dr Silvestre Mesi gasparetto   dx 07/ 1999 s/p  VAD chemotherapy 11/ 1999,  purged autotransplant 03-21-1998 followed by auto stem cell transplant 03-29-1998;  recurrance w/ second autologous stem cell transplant 07-24-2014;  in Re-mission currently , chemo maintenance therapy   Osteoporosis 12/18/05   Increased  risk    PONV (postoperative nausea and vomiting)    Wears glasses      Family History  Problem Relation Age of Onset   Colon cancer Mother    Hypertension Father    Heart Problems Father      Current Outpatient Medications:    acyclovir (ZOVIRAX) 400 MG tablet, TAKE 1 TABLET(400 MG) BY MOUTH TWICE DAILY, Disp: 60 tablet, Rfl: 1   albuterol (VENTOLIN HFA) 108 (90 Base) MCG/ACT inhaler, Inhale 2 puffs into the lungs every 6 (six) hours as needed for wheezing or shortness of breath. 2 puffs 3 times daily x 5 days then every 6 hours as needed., Disp: 18 g, Rfl: 11   amLODipine (NORVASC) 10 MG tablet, TAKE 1 TABLET(10 MG) BY MOUTH EVERY MORNING, Disp: 90 tablet, Rfl: 2   aspirin EC 81 MG tablet, Take 81  mg by mouth daily., Disp: , Rfl:    benzonatate (TESSALON PERLES) 100 MG capsule, Take 1 capsule (100 mg total) by mouth 3 (three) times daily as needed for cough., Disp: 30 capsule, Rfl: 1   Calcium-Magnesium-Vitamin D (CALCIUM 1200+D3 PO), Take 1 tablet by mouth daily., Disp: , Rfl:    Carboxymethylcell-Glycerin PF 0.5-0.9 % SOLN, Place 2 drops into both eyes 4 (four) times daily., Disp: 1 each, Rfl: 11   cetirizine (ZYRTEC) 10 MG tablet, Take 1 tablet (10 mg total) by mouth at bedtime., Disp: 90 tablet, Rfl: 1   Cholecalciferol (VITAMIN D3) 2000 UNITS TABS, Take 2 tablets by mouth daily., Disp: , Rfl:    Fluocinolone  Acetonide Scalp 0.01 % OIL, Apply topically., Disp: , Rfl:    guaiFENesin-dextromethorphan (ROBITUSSIN DM) 100-10 MG/5ML syrup, Take 5 mLs by mouth every 4 (four) hours as needed for cough., Disp: 118 mL, Rfl: 0   hydrochlorothiazide (MICROZIDE) 12.5 MG capsule, Take 12.5 mg by mouth every morning., Disp: , Rfl:    letrozole (FEMARA) 2.5 MG tablet, Take 1 tablet (2.5 mg total) by mouth daily., Disp: 60 tablet, Rfl: 4   lidocaine-prilocaine (EMLA) cream, Apply 1 application topically as needed., Disp: 30 g, Rfl: 3   loratadine (CLARITIN) 10 MG tablet, Take 10 mg by mouth every morning. Reported on 08/19/2015, Disp: , Rfl:    metoprolol succinate (TOPROL-XL) 50 MG 24 hr tablet, TAKE 1 TABLET(50 MG) BY MOUTH EVERY MORNING, Disp: 90 tablet, Rfl: 3   Minoxidil (ROGAINE MENS) 5 % FOAM, See admin instructions., Disp: , Rfl:    MINOXIDIL FOR MEN 5 % SOLN, SMARTSIG:Sparingly Topical Every Night, Disp: , Rfl:    montelukast (SINGULAIR) 10 MG tablet, TAKE 1 TABLET(10 MG) BY MOUTH AT BEDTIME, Disp: 90 tablet, Rfl: 2   ondansetron (ZOFRAN) 8 MG tablet, Take 1 tablet (8 mg total) by mouth 2 (two) times daily as needed (Nausea or vomiting)., Disp: 30 tablet, Rfl: 1   potassium chloride SA (KLOR-CON) 20 MEQ tablet, TAKE 1 TABLET(20 MEQ) BY MOUTH TWICE DAILY, Disp: 60 tablet, Rfl: 3   Probiotic Product (PROBIOTIC DAILY PO), Take by mouth daily., Disp: , Rfl:    triamcinolone (NASACORT) 55 MCG/ACT AERO nasal inhaler, Place 2 sprays into the nose as needed. , Disp: , Rfl:    triamterene-hydrochlorothiazide (MAXZIDE-25) 37.5-25 MG tablet, 1 tablet by mouth at bedtime, Disp: 90 tablet, Rfl: 2   XIIDRA 5 % SOLN, , Disp: , Rfl:    zinc gluconate 50 MG tablet, Take 50 mg by mouth daily., Disp: , Rfl:  No current facility-administered medications for this visit.  Facility-Administered Medications Ordered in Other Visits:    0.9 %  sodium chloride infusion, , Intravenous, Once PRN, Ennever, Rose Phi, MD   albuterol  (PROVENTIL) (2.5 MG/3ML) 0.083% nebulizer solution 2.5 mg, 2.5 mg, Nebulization, Once PRN, Ennever, Rose Phi, MD   sodium chloride flush (NS) 0.9 % injection 10 mL, 10 mL, Intravenous, PRN, Erenest Blank, NP, 10 mL at 12/26/17 1130   sodium chloride flush (NS) 0.9 % injection 10 mL, 10 mL, Intravenous, PRN, Josph Macho, MD, 10 mL at 04/24/19 1305   sodium chloride flush (NS) 0.9 % injection 10 mL, 10 mL, Intravenous, PRN, Josph Macho, MD, 10 mL at 08/21/19 1233   sodium chloride flush (NS) 0.9 % injection 10 mL, 10 mL, Intracatheter, PRN, Erenest Blank, NP, 10 mL at 10/01/19 1053   sodium chloride flush (NS) 0.9 % injection 10 mL,  10 mL, Intracatheter, PRN, Erenest Blank, NP, 10 mL at 12/11/19 1414   Allergies  Allergen Reactions   Codeine Nausea Only     Review of Systems  Constitutional: Negative.  Negative for chills, fatigue and fever.  HENT:  Negative for congestion, sinus pressure and sneezing.   Respiratory: Negative.  Negative for cough and wheezing.   Cardiovascular: Negative.  Negative for chest pain and palpitations.  Gastrointestinal: Negative.  Negative for constipation, diarrhea, nausea and vomiting.  Musculoskeletal:        Right breast pain   Neurological: Negative.  Negative for dizziness, tremors, weakness and headaches.  Psychiatric/Behavioral:  Negative for sleep disturbance.     Today's Vitals   11/18/20 1052  BP: 134/78  Pulse: 83  Temp: 98.2 F (36.8 C)  TempSrc: Oral  Weight: 174 lb 3.2 oz (79 kg)  Height: 5' 3.4" (1.61 m)  PainSc: 6    Body mass index is 30.47 kg/m.   Objective:  Physical Exam Constitutional:      Appearance: Normal appearance.  HENT:     Head: Normocephalic and atraumatic.  Cardiovascular:     Rate and Rhythm: Normal rate and regular rhythm.     Pulses: Normal pulses.     Heart sounds: Normal heart sounds. No murmur heard. Pulmonary:     Effort: Pulmonary effort is normal. No respiratory distress.     Breath  sounds: Normal breath sounds. No wheezing.  Chest:  Breasts:    Tanner Score is 5.     Right: Normal. No swelling, bleeding, inverted nipple, mass, nipple discharge or skin change.     Left: Normal. No swelling, bleeding, inverted nipple, mass, nipple discharge or skin change.     Comments: No cyst/mass palpated upon palpation.  Skin:    General: Skin is warm and dry.     Capillary Refill: Capillary refill takes less than 2 seconds.  Neurological:     Mental Status: She is alert and oriented to person, place, and time.        Assessment And Plan:     1. Pain of right breast - MM Digital Diagnostic Bilat; Future  -Patient has not had a mammogram this year.  -Pain started x 1 ago.  -Will send pt. For a diagnostic mammogram.   The patient was encouraged to call or send a message through MyChart for any questions or concerns.   Follow up: if symptoms persist or do not get better.   Side effects and appropriate use of all the medication(s) were discussed with the patient today. Patient advised to use the medication(s) as directed by their healthcare provider. The patient was encouraged to read, review, and understand all associated package inserts and contact our office with any questions or concerns. The patient accepts the risks of the treatment plan and had an opportunity to ask questions.   Staying healthy and adopting a healthy lifestyle for your overall health is important. You should eat 7 or more servings of fruits and vegetables per day. You should drink plenty of water to keep yourself hydrated and your kidneys healthy. This includes about 65-80+ fluid ounces of water. Limit your intake of animal fats especially for elevated cholesterol. Avoid highly processed food and limit your salt intake if you have hypertension. Avoid foods high in saturated/Trans fats. Along with a healthy diet it is also very important to maintain time for yourself to maintain a healthy mental health with low  stress levels. You should get atleast  150 min of moderate intensity exercise weekly for a healthy heart. Along with eating right and exercising, aim for at least 7-9 hours of sleep daily.  Eat more whole grains which includes barley, wheat berries, oats, brown rice and whole wheat pasta. Use healthy plant oils which include olive, soy, corn, sunflower and peanut. Limit your caffeine and sugary drinks. Limit your intake of fast foods. Limit milk and dairy products to one or two daily servings.   Patient was given opportunity to ask questions. Patient verbalized understanding of the plan and was able to repeat key elements of the plan. All questions were answered to their satisfaction.  Raman Maykayla Highley, DNP   I, Raman Edan Serratore have reviewed all documentation for this visit. The documentation on 11/21/20 for the exam, diagnosis, procedures, and orders are all accurate and complete.   IF YOU HAVE BEEN REFERRED TO A SPECIALIST, IT MAY TAKE 1-2 WEEKS TO SCHEDULE/PROCESS THE REFERRAL. IF YOU HAVE NOT HEARD FROM US/SPECIALIST IN TWO WEEKS, PLEASE GIVE [place] A CALL AT [phone] X [phone].   THE PATIENT IS ENCOURAGED TO PRACTICE SOCIAL DISTANCING DUE TO THE COVID-19 PANDEMIC.

## 2020-11-21 ENCOUNTER — Encounter: Payer: Self-pay | Admitting: Nurse Practitioner

## 2020-11-24 ENCOUNTER — Other Ambulatory Visit: Payer: Self-pay | Admitting: Nurse Practitioner

## 2020-11-24 DIAGNOSIS — N644 Mastodynia: Secondary | ICD-10-CM

## 2020-11-30 ENCOUNTER — Encounter: Payer: Self-pay | Admitting: *Deleted

## 2020-11-30 DIAGNOSIS — H35033 Hypertensive retinopathy, bilateral: Secondary | ICD-10-CM | POA: Diagnosis not present

## 2020-11-30 DIAGNOSIS — H04223 Epiphora due to insufficient drainage, bilateral lacrimal glands: Secondary | ICD-10-CM | POA: Diagnosis not present

## 2020-11-30 DIAGNOSIS — H25813 Combined forms of age-related cataract, bilateral: Secondary | ICD-10-CM | POA: Diagnosis not present

## 2020-11-30 DIAGNOSIS — H16143 Punctate keratitis, bilateral: Secondary | ICD-10-CM | POA: Diagnosis not present

## 2020-11-30 NOTE — Progress Notes (Unsigned)
Patient Status Form completed for Patient Emily Greer chemo visit tomorrow. Office visit notes from Dr Jerline Pain reviewed with Dr Marin Olp Authorization code is [id number]

## 2020-12-01 ENCOUNTER — Other Ambulatory Visit: Payer: Self-pay

## 2020-12-01 ENCOUNTER — Encounter: Payer: Self-pay | Admitting: Hematology & Oncology

## 2020-12-01 ENCOUNTER — Inpatient Hospital Stay: Payer: Medicare Other

## 2020-12-01 ENCOUNTER — Inpatient Hospital Stay: Payer: Medicare Other | Attending: Hematology & Oncology

## 2020-12-01 ENCOUNTER — Inpatient Hospital Stay (HOSPITAL_BASED_OUTPATIENT_CLINIC_OR_DEPARTMENT_OTHER): Payer: Medicare Other | Admitting: Hematology & Oncology

## 2020-12-01 VITALS — BP 147/80 | HR 79 | Temp 98.7°F | Resp 17 | Ht 64.0 in | Wt 175.0 lb

## 2020-12-01 DIAGNOSIS — Z8542 Personal history of malignant neoplasm of other parts of uterus: Secondary | ICD-10-CM | POA: Diagnosis not present

## 2020-12-01 DIAGNOSIS — C9000 Multiple myeloma not having achieved remission: Secondary | ICD-10-CM

## 2020-12-01 DIAGNOSIS — C7982 Secondary malignant neoplasm of genital organs: Secondary | ICD-10-CM | POA: Diagnosis not present

## 2020-12-01 DIAGNOSIS — Z923 Personal history of irradiation: Secondary | ICD-10-CM | POA: Insufficient documentation

## 2020-12-01 DIAGNOSIS — M7989 Other specified soft tissue disorders: Secondary | ICD-10-CM | POA: Insufficient documentation

## 2020-12-01 DIAGNOSIS — H538 Other visual disturbances: Secondary | ICD-10-CM | POA: Diagnosis not present

## 2020-12-01 DIAGNOSIS — C9001 Multiple myeloma in remission: Secondary | ICD-10-CM

## 2020-12-01 DIAGNOSIS — Z5112 Encounter for antineoplastic immunotherapy: Secondary | ICD-10-CM | POA: Diagnosis not present

## 2020-12-01 DIAGNOSIS — C541 Malignant neoplasm of endometrium: Secondary | ICD-10-CM | POA: Diagnosis not present

## 2020-12-01 DIAGNOSIS — Z9484 Stem cells transplant status: Secondary | ICD-10-CM | POA: Diagnosis not present

## 2020-12-01 DIAGNOSIS — Z885 Allergy status to narcotic agent status: Secondary | ICD-10-CM | POA: Diagnosis not present

## 2020-12-01 DIAGNOSIS — D509 Iron deficiency anemia, unspecified: Secondary | ICD-10-CM | POA: Insufficient documentation

## 2020-12-01 LAB — CMP (CANCER CENTER ONLY)
ALT: 18 U/L (ref 0–44)
AST: 23 U/L (ref 15–41)
Albumin: 4 g/dL (ref 3.5–5.0)
Alkaline Phosphatase: 65 U/L (ref 38–126)
Anion gap: 9 (ref 5–15)
BUN: 13 mg/dL (ref 8–23)
CO2: 28 mmol/L (ref 22–32)
Calcium: 9.8 mg/dL (ref 8.9–10.3)
Chloride: 103 mmol/L (ref 98–111)
Creatinine: 0.83 mg/dL (ref 0.44–1.00)
GFR, Estimated: >60 mL/min (ref >60)
Glucose, Bld: 95 mg/dL (ref 70–99)
Potassium: 3.3 mmol/L — ABNORMAL LOW (ref 3.5–5.1)
Sodium: 140 mmol/L (ref 135–145)
Total Bilirubin: 0.3 mg/dL (ref 0.3–1.2)
Total Protein: 6.3 g/dL — ABNORMAL LOW (ref 6.5–8.1)

## 2020-12-01 LAB — CBC WITH DIFFERENTIAL (CANCER CENTER ONLY)
Abs Immature Granulocytes: 0.03 10*3/uL (ref 0.00–0.07)
Basophils Absolute: 0.1 10*3/uL (ref 0.0–0.1)
Basophils Relative: 1 %
Eosinophils Absolute: 0.1 10*3/uL (ref 0.0–0.5)
Eosinophils Relative: 2 %
HCT: 33.1 % — ABNORMAL LOW (ref 36.0–46.0)
Hemoglobin: 10.8 g/dL — ABNORMAL LOW (ref 12.0–15.0)
Immature Granulocytes: 1 %
Lymphocytes Relative: 13 %
Lymphs Abs: 0.7 10*3/uL (ref 0.7–4.0)
MCH: 25.5 pg — ABNORMAL LOW (ref 26.0–34.0)
MCHC: 32.6 g/dL (ref 30.0–36.0)
MCV: 78.1 fL — ABNORMAL LOW (ref 80.0–100.0)
Monocytes Absolute: 0.8 10*3/uL (ref 0.1–1.0)
Monocytes Relative: 14 %
Neutro Abs: 3.8 10*3/uL (ref 1.7–7.7)
Neutrophils Relative %: 69 %
Platelet Count: 129 10*3/uL — ABNORMAL LOW (ref 150–400)
RBC: 4.24 MIL/uL (ref 3.87–5.11)
RDW: 21.1 % — ABNORMAL HIGH (ref 11.5–15.5)
WBC Count: 5.5 10*3/uL (ref 4.0–10.5)
nRBC: 0 % (ref 0.0–0.2)

## 2020-12-01 LAB — LACTATE DEHYDROGENASE: LDH: 371 U/L — ABNORMAL HIGH (ref 98–192)

## 2020-12-01 MED ORDER — PROCHLORPERAZINE MALEATE 10 MG PO TABS
10.0000 mg | ORAL_TABLET | Freq: Once | ORAL | Status: AC
  Administered 2020-12-01: 10 mg via ORAL
  Filled 2020-12-01: qty 1

## 2020-12-01 MED ORDER — SODIUM CHLORIDE 0.9 % IV SOLN: Freq: Once | INTRAVENOUS | Status: AC

## 2020-12-01 MED ORDER — SODIUM CHLORIDE 0.9% FLUSH
10.0000 mL | INTRAVENOUS | Status: DC | PRN
  Administered 2020-12-01: 10 mL

## 2020-12-01 MED ORDER — ACETAMINOPHEN 325 MG PO TABS
650.0000 mg | ORAL_TABLET | Freq: Once | ORAL | Status: AC
  Administered 2020-12-01: 650 mg via ORAL
  Filled 2020-12-01: qty 2

## 2020-12-01 MED ORDER — DENOSUMAB 120 MG/1.7ML ~~LOC~~ SOLN
120.0000 mg | Freq: Once | SUBCUTANEOUS | Status: AC
  Administered 2020-12-01: 120 mg via SUBCUTANEOUS
  Filled 2020-12-01: qty 1.7

## 2020-12-01 MED ORDER — DIPHENHYDRAMINE HCL 25 MG PO CAPS
50.0000 mg | ORAL_CAPSULE | Freq: Once | ORAL | Status: AC
  Administered 2020-12-01: 50 mg via ORAL
  Filled 2020-12-01: qty 2

## 2020-12-01 MED ORDER — HEPARIN SOD (PORK) LOCK FLUSH 100 UNIT/ML IV SOLN
500.0000 [IU] | Freq: Once | INTRAVENOUS | Status: AC | PRN
  Administered 2020-12-01: 500 [IU]

## 2020-12-01 MED ORDER — SODIUM CHLORIDE 0.9 % IV SOLN
2.0000 mg/kg | Freq: Once | INTRAVENOUS | Status: AC
  Administered 2020-12-01: 155 mg via INTRAVENOUS
  Filled 2020-12-01: qty 3.1

## 2020-12-01 NOTE — Patient Instructions (Signed)
[facility] [place] [place]  Discharge Instructions: Thank you for choosing [facility]  to provide your oncology and hematology care.   If you have a lab appointment with the [facility], please go directly to the [facility] and check in at the registration area.  Wear comfortable clothing and clothing appropriate for easy access to any Portacath or PICC line.   We strive to give you quality time with your provider. You may need to reschedule your appointment if you arrive late (15 or more minutes).  Arriving late affects you and other patients whose appointments are after yours.  Also, if you miss three or more appointments without notifying the office, you may be dismissed from the clinic at the provider's discretion.      For prescription refill requests, have your pharmacy contact our office and allow 72 hours for refills to be completed.    Today you received the following chemotherapy and/or immunotherapy agents : Blenrep      To help prevent nausea and vomiting after your treatment, we encourage you to take your nausea medication as directed.  BELOW ARE SYMPTOMS THAT SHOULD BE REPORTED IMMEDIATELY: *FEVER GREATER THAN 100.4 F (38 C) OR HIGHER *CHILLS OR SWEATING *NAUSEA AND VOMITING THAT IS NOT CONTROLLED WITH YOUR NAUSEA MEDICATION *UNUSUAL SHORTNESS OF BREATH *UNUSUAL BRUISING OR BLEEDING *URINARY PROBLEMS (pain or burning when urinating, or frequent urination) *BOWEL PROBLEMS (unusual diarrhea, constipation, pain near the anus) TENDERNESS IN MOUTH AND THROAT WITH OR WITHOUT PRESENCE OF ULCERS (sore throat, sores in mouth, or a toothache) UNUSUAL RASH, SWELLING OR PAIN  UNUSUAL VAGINAL DISCHARGE OR ITCHING   Items with * indicate a potential emergency and should be followed up as soon as possible or go to the Emergency Department if any problems should occur.  Please show the CHEMOTHERAPY ALERT CARD or IMMUNOTHERAPY ALERT CARD at check-in to the  Emergency Department and triage nurse. Should you have questions after your visit or need to cancel or reschedule your appointment, please contact [facility]  [phone] and follow the prompts.  Office hours are 8:00 a.m. to 4:30 p.m. Monday - Friday. Please note that voicemails left after 4:00 p.m. may not be returned until the following business day.  We are closed weekends and major holidays. You have access to a nurse at all times for urgent questions. Please call the main number to the clinic [phone] and follow the prompts.  For any non-urgent questions, you may also contact your provider using MyChart. We now offer e-Visits for anyone 31 and older to request care online for non-urgent symptoms. For details visit [email or web address].   Also download the MyChart app! Go to the app store, search "MyChart", open the app, select [facility], and log in with your MyChart username and password.  Due to Covid, a mask is required upon entering the hospital/clinic. If you do not have a mask, one will be given to you upon arrival. For doctor visits, patients may have 1 support person aged 49 or older with them. For treatment visits, patients cannot have anyone with them due to current Covid guidelines and our immunocompromised population.

## 2020-12-01 NOTE — Progress Notes (Signed)
Hematology and Oncology Follow Up Visit  Emily Greer [id number] 10-24-1950 70 y.o. 12/01/2020   Principle Diagnosis:  Recurrent lambda light chain myeloma - nl cytogenetics History of recurrent endometrial carcinoma Iron deficiency anemia -blood loss   Past Therapy:             Status post second autologous stem cell transplant on 07/24/2014 Maintenance therapy with Pomalidomide/every 2 week Velcade - d/c'ed Radiation therapy for endometrial recurrence - completed 04/20/2015 Pomalyst/Kyprolis 70mg /m2 IV q 2 weeks - s/p cycle #12 - held on 12/26/2017 for hematuria Daratumumab/Pomalyst (1 mg) - status post cycle 19 -- d/c on 08/21/2019 Melflufen 40 mg IV q 4 weeks -- started on 08/27/2019, s/p cycle #2 --  D/c due to FDA removal Selinexor 60 mg po q week -- start on 01/19/2020 -- changed on 03/18/2020 -- d/c on 04/27/2020   Current Therapy:        Blenrep 2.5 mg/m2 IV q 3 weeks -- started on 05/20/2020, s/p cycle #7 Femara 2.5 mg po q day  Xgeva 120 mg subcu every 3 months - next dose in 01/2021  IV iron as indicated   Interim History:  Emily Greer is here today for follow-up and treatment.  She did have a very busy weekend.  It was the annual home, and for Emily Greer.  They had some guests come in.  They really had a good time.  They went to the football game.  She still has the issues with the cataracts.  We will try to get the cataract surgery done hopefully the end of the month.  Para she is responding to the Emily Greer.  Her last lambda light chain was down to 4.8 mg/dL.  We will give her last dose of Blenrep today before she will have her surgery.  Hopefully, she can have the surgery at the end of the month.  She has had no problems with bleeding.  There is no problems with the Femara.  She has had no change in bowel or bladder habits.  There is been no cough.  She has had little bit of leg swelling but this is chronic.[name] Kitchen  She has had no issues with rashes.  Overall,  performance status is ECOG 1.    Medications:  Allergies as of 12/01/2020       Reactions   Codeine Nausea Only        Medication List        Accurate as of December 01, 2020 10:42 AM. If you have any questions, ask your nurse or doctor.          STOP taking these medications    Minoxidil for Men 5 % Soln Generic drug: MINOXIDIL (TOPICAL) Stopped by: Emily Macho, MD   Rogaine Mens 5 % Foam Generic drug: Minoxidil Stopped by: Emily Macho, MD       TAKE these medications    acyclovir 400 MG tablet Commonly known as: ZOVIRAX TAKE 1 TABLET(400 MG) BY MOUTH TWICE DAILY   albuterol 108 (90 Base) MCG/ACT inhaler Commonly known as: VENTOLIN HFA Inhale 2 puffs into the lungs every 6 (six) hours as needed for wheezing or shortness of breath. 2 puffs 3 times daily x 5 days then every 6 hours as needed.   amLODipine 10 MG tablet Commonly known as: NORVASC TAKE 1 TABLET(10 MG) BY MOUTH EVERY MORNING   aspirin EC 81 MG tablet Take 81 mg by mouth daily.   benzonatate 100 MG capsule Commonly known as:  Tessalon Perles Take 1 capsule (100 mg total) by mouth 3 (three) times daily as needed for cough.   CALCIUM 1200+D3 PO Take 1 tablet by mouth daily.   Carboxymethylcell-Glycerin PF 0.5-0.9 % Soln Place 2 drops into both eyes 4 (four) times daily.   cetirizine 10 MG tablet Commonly known as: ZYRTEC Take 1 tablet (10 mg total) by mouth at bedtime.   Fluocinolone Acetonide Scalp 0.01 % Oil Apply topically.   guaiFENesin-dextromethorphan 100-10 MG/5ML syrup Commonly known as: ROBITUSSIN DM Take 5 mLs by mouth every 4 (four) hours as needed for cough.   hydrochlorothiazide 12.5 MG capsule Commonly known as: MICROZIDE Take 12.5 mg by mouth every morning.   letrozole 2.5 MG tablet Commonly known as: FEMARA Take 1 tablet (2.5 mg total) by mouth daily.   lidocaine-prilocaine cream Commonly known as: EMLA Apply 1 application topically as needed.    loratadine 10 MG tablet Commonly known as: CLARITIN Take 10 mg by mouth every morning. Reported on 08/19/2015   metoprolol succinate 50 MG 24 hr tablet Commonly known as: TOPROL-XL TAKE 1 TABLET(50 MG) BY MOUTH EVERY MORNING   montelukast 10 MG tablet Commonly known as: SINGULAIR TAKE 1 TABLET(10 MG) BY MOUTH AT BEDTIME   ondansetron 8 MG tablet Commonly known as: Zofran Take 1 tablet (8 mg total) by mouth 2 (two) times daily as needed (Nausea or vomiting).   potassium chloride SA 20 MEQ tablet Commonly known as: KLOR-CON TAKE 1 TABLET(20 MEQ) BY MOUTH TWICE DAILY   PROBIOTIC DAILY PO Take by mouth daily.   triamcinolone 55 MCG/ACT Aero nasal inhaler Commonly known as: NASACORT Place 2 sprays into the nose as needed.   triamterene-hydrochlorothiazide 37.5-25 MG tablet Commonly known as: MAXZIDE-25 1 tablet by mouth at bedtime   Vitamin D3 50 MCG (2000 UT) Tabs Take 2 tablets by mouth daily.   Xiidra 5 % Soln Generic drug: Lifitegrast   zinc gluconate 50 MG tablet Take 50 mg by mouth daily.        Allergies:  Allergies  Allergen Reactions   Codeine Nausea Only    Past Medical History, Surgical history, Social history, and Family History were reviewed and updated.  Review of Systems: Review of Systems  Constitutional: Negative.   HENT: Negative.    Eyes:  Positive for blurred vision.  Respiratory: Negative.    Cardiovascular: Negative.   Gastrointestinal: Negative.   Genitourinary: Negative.   Musculoskeletal: Negative.   Skin: Negative.   Neurological: Negative.   Endo/Heme/Allergies: Negative.   Psychiatric/Behavioral: Negative.      Physical Exam:  height is 5\' 4"  (1.626 m) and weight is 175 lb (79.4 kg). Her temperature is 98.7 F (37.1 C). Her blood pressure is 147/80 (abnormal) and her pulse is 79. Her respiration is 17 and oxygen saturation is 99%.   Wt Readings from Last 3 Encounters:  12/01/20 175 lb (79.4 kg)  11/18/20 174 lb 3.2 oz  (79 kg)  11/10/20 173 lb (78.5 kg)    Physical Exam Vitals reviewed.  HENT:     Head: Normocephalic and atraumatic.  Eyes:     Pupils: Pupils are equal, round, and reactive to light.  Cardiovascular:     Rate and Rhythm: Normal rate and regular rhythm.     Heart sounds: Normal heart sounds.  Pulmonary:     Effort: Pulmonary effort is normal.     Breath sounds: Normal breath sounds.  Abdominal:     General: Bowel sounds are normal.  Palpations: Abdomen is soft.  Musculoskeletal:        General: No tenderness or deformity. Normal range of motion.     Cervical back: Normal range of motion.  Lymphadenopathy:     Cervical: No cervical adenopathy.  Skin:    General: Skin is warm and dry.     Findings: No erythema or rash.  Neurological:     Mental Status: She is alert and oriented to person, place, and time.  Psychiatric:        Behavior: Behavior normal.        Thought Content: Thought content normal.        Judgment: Judgment normal.     Lab Results  Component Value Date   WBC 5.5 12/01/2020   HGB 10.8 (L) 12/01/2020   HCT 33.1 (L) 12/01/2020   MCV 78.1 (L) 12/01/2020   PLT 129 (L) 12/01/2020   Lab Results  Component Value Date   FERRITIN 4,970 (H) 09/24/2020   IRON 68 10/19/2020   TIBC 341 10/19/2020   UIBC 274 10/19/2020   IRONPCTSAT 20 (L) 10/19/2020   Lab Results  Component Value Date   RETICCTPCT 1.6 11/05/2019   RBC 4.24 12/01/2020   Lab Results  Component Value Date   KPAFRELGTCHN 3.3 11/10/2020   LAMBDASER 46.9 (H) 11/10/2020   KAPLAMBRATIO 0.07 (L) 11/10/2020   Lab Results  Component Value Date   IGGSERUM 316 (L) 11/10/2020   IGA 5 (L) 11/10/2020   IGMSERUM <5 (L) 11/10/2020   Lab Results  Component Value Date   TOTALPROTELP 6.0 11/10/2020   ALBUMINELP 3.6 11/10/2020   A1GS 0.3 11/10/2020   A2GS 0.9 11/10/2020   BETS 1.1 11/10/2020   BETA2SER 0.4 11/23/2014   GAMS 0.2 (L) 11/10/2020   MSPIKE Not Observed 11/10/2020   SPEI Comment  10/19/2020     Chemistry      Component Value Date/Time   NA 140 12/01/2020 0906   NA 141 01/10/2017 1115   NA 140 06/21/2016 0918   K 3.3 (L) 12/01/2020 0906   K 4.0 01/10/2017 1115   K 4.3 06/21/2016 0918   CL 103 12/01/2020 0906   CL 106 01/10/2017 1115   CO2 28 12/01/2020 0906   CO2 27 01/10/2017 1115   CO2 20 (L) 06/21/2016 0918   BUN 13 12/01/2020 0906   BUN 15 01/10/2017 1115   BUN 15.8 06/21/2016 0918   CREATININE 0.83 12/01/2020 0906   CREATININE 1.0 01/10/2017 1115   CREATININE 0.8 06/21/2016 0918      Component Value Date/Time   CALCIUM 9.8 12/01/2020 0906   CALCIUM 9.5 01/10/2017 1115   CALCIUM 9.4 06/21/2016 0918   ALKPHOS 65 12/01/2020 0906   ALKPHOS 40 01/10/2017 1115   ALKPHOS 66 06/21/2016 0918   AST 23 12/01/2020 0906   AST 17 06/21/2016 0918   ALT 18 12/01/2020 0906   ALT 19 01/10/2017 1115   ALT 37 06/21/2016 0918   BILITOT 0.3 12/01/2020 0906   BILITOT 0.32 06/21/2016 0918       Impression and Plan: Ms. Tobey is a very pleasant 69 yo African American female with recurrent lambda light chain myeloma.  She had her second stem cell transplant for light chain myeloma was in June 2016.   She is doing well on the Blenrep.  We will see what her lambda light chain levels look like.  Hopefully, they will still be stable, if not better.  Again, we will call her ophthalmologist.  We will tell  him that this is her last Blenrep before surgery.  I think he would like to have 4 weeks before surgery for the cataracts.  We will subsequently get her back right before Christmas for her next treatment and visit.  Hopefully by then, her vision will be a little bit better with the cataracts removed.   Emily Macho, MD 11/2/202210:42 AM

## 2020-12-01 NOTE — Progress Notes (Signed)
Belantamab Mafodotin (BLENREP) Infusion Checklist   Prior to Infusion:  []  BLENREP REMS program verification has been completed online at [email or web address].  []  Authorization to dispense has been obtained.  []  REMS authorization code received and is [id number].   Following Infusion:  [x]  REMS Checklist has been submitted online at [email or web address].   Progress note 12/01/2018 in chart  Armanda [facility] 12/01/20 10:46 AM

## 2020-12-01 NOTE — Progress Notes (Signed)
REMS checklist complete for Blenrep infusion given on 12/01/20. REMS ID: [id number]

## 2020-12-02 ENCOUNTER — Ambulatory Visit (INDEPENDENT_AMBULATORY_CARE_PROVIDER_SITE_OTHER): Payer: Medicare Other | Admitting: Internal Medicine

## 2020-12-02 ENCOUNTER — Encounter: Payer: Self-pay | Admitting: Internal Medicine

## 2020-12-02 VITALS — BP 132/80 | HR 95 | Temp 98.6°F | Ht 64.0 in | Wt 175.4 lb

## 2020-12-02 DIAGNOSIS — E6609 Other obesity due to excess calories: Secondary | ICD-10-CM

## 2020-12-02 DIAGNOSIS — N644 Mastodynia: Secondary | ICD-10-CM

## 2020-12-02 DIAGNOSIS — Z6830 Body mass index (BMI) 30.0-30.9, adult: Secondary | ICD-10-CM | POA: Diagnosis not present

## 2020-12-02 DIAGNOSIS — M879 Osteonecrosis, unspecified: Secondary | ICD-10-CM

## 2020-12-02 DIAGNOSIS — Z23 Encounter for immunization: Secondary | ICD-10-CM

## 2020-12-02 DIAGNOSIS — Z1159 Encounter for screening for other viral diseases: Secondary | ICD-10-CM

## 2020-12-02 DIAGNOSIS — I10 Essential (primary) hypertension: Secondary | ICD-10-CM | POA: Diagnosis not present

## 2020-12-02 LAB — IGG, IGA, IGM
IgA: 5 mg/dL — ABNORMAL LOW (ref 87–352)
IgG (Immunoglobin G), Serum: 277 mg/dL — ABNORMAL LOW (ref 586–1602)
IgM (Immunoglobulin M), Srm: <5 mg/dL — ABNORMAL LOW (ref 26–217)

## 2020-12-02 LAB — KAPPA/LAMBDA LIGHT CHAINS
Kappa free light chain: 3.1 mg/L — ABNORMAL LOW (ref 3.3–19.4)
Kappa, lambda light chain ratio: 0.04 — ABNORMAL LOW (ref 0.26–1.65)
Lambda free light chains: 71.4 mg/L — ABNORMAL HIGH (ref 5.7–26.3)

## 2020-12-02 MED ORDER — PREVNAR 20 0.5 ML IM SUSY: 0.5000 mL | PREFILLED_SYRINGE | INTRAMUSCULAR | 0 refills | Status: AC

## 2020-12-02 NOTE — Progress Notes (Signed)
Emily Greer,Emily Greer,acting as a Neurosurgeon for Gwynneth Aliment, MD.,have documented all relevant documentation on the behalf of Gwynneth Aliment, MD,as directed by  Gwynneth Aliment, MD while in the presence of Gwynneth Aliment, MD.  This visit occurred during the SARS-CoV-2 public health emergency.  Safety protocols were in place, including screening questions prior to the visit, additional usage of staff PPE, and extensive cleaning of exam room while observing appropriate contact time as indicated for disinfecting solutions.  Subjective:     Patient ID: Emily Greer , female    DOB: 01-06-1951 , 71 y.o.   MRN: [id number]   Chief Complaint  Patient presents with   Hypertension    HPI  She presents today for BP f/u. She reports compliance with meds.  She denies headaches,chest pain and shortness of breath.   Hypertension This is a chronic problem. The current episode started more than 1 year ago. The problem has been gradually improving since onset. The problem is controlled. Pertinent negatives include no blurred vision, chest pain, palpitations or shortness of breath. Past treatments include beta blockers and diuretics. The current treatment provides moderate improvement. Compliance problems include exercise.  There is no history of pheochromocytoma or renovascular disease.    Past Medical History:  Diagnosis Date   Avascular necrosis of femoral head (HCC)    bilateral per CT 07-26-2015   Endometrial carcinoma [facility]) gyn oncologist-  dr Nelly Rout ([facility])/  radiation oncologist-- dr Roselind Messier   2013 dx  FIGO Stage 1A, Grade 2 endometrioid endometrial cancer s/p TAH w/ BSO and bilateral pelvic node dissection 10-31-2011 ;  recurrence at distal vagina 04/ 2014 s/p  brachytherapy (ended 07-29-2012);  2nd recurrence 12/ 2016  vaginal apex s/p  conformational radiotherapy 03-10-2015 to 04-20-2015   Family history of adverse reaction to anesthesia    mother ponv   GERD (gastroesophageal  reflux disease)    Goals of care, counseling/discussion 08/21/2019   H/O stem cell transplant (HCC)    02/ 2000 and second one 06/ 2016   History of bacteremia    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   History of chemotherapy    last chemo 12-26-17   History of radiation therapy 6/4, 6/11, 6/19, 6/25, 07/29/2012   vagina 30.5 gray in 5 fx, HDR brachytherapy:   last radiation to vagina 03-10-2015 to 04-20-2015  50.4gray   History of radiation therapy 03/10/15-04/20/15   vagina 50.4 in 28 fractions   Hypertension    Lambda light chain myeloma [facility]) oncologist-  dr Myna Hidalgo ([facility])  and  [facility] -- dr Silvestre Mesi gasparetto   dx 07/ 1999 s/p  VAD chemotherapy 11/ 1999,  purged autotransplant 03-21-1998 followed by auto stem cell transplant 03-29-1998;  recurrance w/ second autologous stem cell transplant 07-24-2014;  in Re-mission currently , chemo maintenance therapy   Osteoporosis 12/18/05   Increased  risk    PONV (postoperative nausea and vomiting)    Wears glasses      Family History  Problem Relation Age of Onset   Colon cancer Mother    Hypertension Father    Heart Problems Father      Current Outpatient Medications:    acyclovir (ZOVIRAX) 400 MG tablet, TAKE 1 TABLET(400 MG) BY MOUTH TWICE DAILY, Disp: 60 tablet, Rfl: 1   albuterol (VENTOLIN HFA) 108 (90 Base) MCG/ACT inhaler, Inhale 2 puffs into the lungs every 6 (six) hours as needed for wheezing or shortness of breath. 2 puffs 3  times daily x 5 days then every 6 hours as needed., Disp: 18 g, Rfl: 11   amLODipine (NORVASC) 10 MG tablet, TAKE 1 TABLET(10 MG) BY MOUTH EVERY MORNING, Disp: 90 tablet, Rfl: 2   aspirin EC 81 MG tablet, Take 81 mg by mouth daily., Disp: , Rfl:    Calcium-Magnesium-Vitamin D (CALCIUM 1200+D3 PO), Take 1 tablet by mouth daily., Disp: , Rfl:    cetirizine (ZYRTEC) 10 MG tablet, Take 1 tablet (10 mg total) by mouth at bedtime., Disp: 90 tablet, Rfl: 1   Cholecalciferol (VITAMIN  D3) 2000 UNITS TABS, Take 2 tablets by mouth daily., Disp: , Rfl:    Fluocinolone Acetonide Scalp 0.01 % OIL, Apply topically., Disp: , Rfl:    guaiFENesin-dextromethorphan (ROBITUSSIN DM) 100-10 MG/5ML syrup, Take 5 mLs by mouth every 4 (four) hours as needed for cough., Disp: 118 mL, Rfl: 0   hydrochlorothiazide (MICROZIDE) 12.5 MG capsule, Take 12.5 mg by mouth every morning., Disp: , Rfl:    letrozole (FEMARA) 2.5 MG tablet, Take 1 tablet (2.5 mg total) by mouth daily., Disp: 60 tablet, Rfl: 4   lidocaine-prilocaine (EMLA) cream, Apply 1 application topically as needed., Disp: 30 g, Rfl: 3   loratadine (CLARITIN) 10 MG tablet, Take 10 mg by mouth every morning. Reported on 08/19/2015, Disp: , Rfl:    metoprolol succinate (TOPROL-XL) 50 MG 24 hr tablet, TAKE 1 TABLET(50 MG) BY MOUTH EVERY MORNING, Disp: 90 tablet, Rfl: 3   montelukast (SINGULAIR) 10 MG tablet, TAKE 1 TABLET(10 MG) BY MOUTH AT BEDTIME, Disp: 90 tablet, Rfl: 2   ondansetron (ZOFRAN) 8 MG tablet, Take 1 tablet (8 mg total) by mouth 2 (two) times daily as needed (Nausea or vomiting)., Disp: 30 tablet, Rfl: 1   potassium chloride SA (KLOR-CON) 20 MEQ tablet, TAKE 1 TABLET(20 MEQ) BY MOUTH TWICE DAILY, Disp: 60 tablet, Rfl: 3   Probiotic Product (PROBIOTIC DAILY PO), Take by mouth daily., Disp: , Rfl:    triamcinolone (NASACORT) 55 MCG/ACT AERO nasal inhaler, Place 2 sprays into the nose as needed. , Disp: , Rfl:    triamterene-hydrochlorothiazide (MAXZIDE-25) 37.5-25 MG tablet, 1 tablet by mouth at bedtime, Disp: 90 tablet, Rfl: 2   XIIDRA 5 % SOLN, , Disp: , Rfl:    zinc gluconate 50 MG tablet, Take 50 mg by mouth daily., Disp: , Rfl:    Zinc 50 MG TABS, Take 50 mg by mouth daily., Disp: , Rfl:  No current facility-administered medications for this visit.  Facility-Administered Medications Ordered in Other Visits:    0.9 %  sodium chloride infusion, , Intravenous, Once PRN, Ennever, Rose Phi, MD   albuterol (PROVENTIL) (2.5 MG/3ML)  0.083% nebulizer solution 2.5 mg, 2.5 mg, Nebulization, Once PRN, Ennever, Rose Phi, MD   sodium chloride flush (NS) 0.9 % injection 10 mL, 10 mL, Intravenous, PRN, Erenest Blank, NP, 10 mL at 12/26/17 1130   sodium chloride flush (NS) 0.9 % injection 10 mL, 10 mL, Intravenous, PRN, Josph Macho, MD, 10 mL at 04/24/19 1305   sodium chloride flush (NS) 0.9 % injection 10 mL, 10 mL, Intravenous, PRN, Josph Macho, MD, 10 mL at 08/21/19 1233   sodium chloride flush (NS) 0.9 % injection 10 mL, 10 mL, Intracatheter, PRN, Erenest Blank, NP, 10 mL at 10/01/19 1053   sodium chloride flush (NS) 0.9 % injection 10 mL, 10 mL, Intracatheter, PRN, Erenest Blank, NP, 10 mL at 12/11/19 1414   Allergies  Allergen Reactions  Codeine Nausea Only     Review of Systems  Constitutional: Negative.   Eyes:  Negative for blurred vision.  Respiratory: Negative.  Negative for shortness of breath.   Cardiovascular: Negative.  Negative for chest pain and palpitations.  Gastrointestinal: Negative.   Genitourinary:        She c/o breast pain. No nipple discharge. No masses appreciated. Sx started about 2-3 weeks ago. Unable to state what triggers her sx. She was evaluated by NP for sx about 2-3 weeks ago.   Psychiatric/Behavioral: Negative.    All other systems reviewed and are negative.   Today's Vitals   12/02/20 1028  BP: 132/80  Pulse: 95  Temp: 98.6 F (37 C)  Weight: 175 lb 6.4 oz (79.6 kg)  Height: 5\' 4"  (1.626 m)  PainSc: 0-No pain   Body mass index is 30.11 kg/m.  Wt Readings from Last 3 Encounters:  12/22/20 174 lb (78.9 kg)  12/02/20 175 lb 6.4 oz (79.6 kg)  12/01/20 175 lb (79.4 kg)    BP Readings from Last 3 Encounters:  12/22/20 (!) 141/74  12/02/20 132/80  12/01/20 (!) 147/80    Objective:  Physical Exam Vitals and nursing note reviewed.  Constitutional:      Appearance: Normal appearance.  HENT:     Head: Normocephalic and atraumatic.     Nose:     Comments:  Masked     Mouth/Throat:     Comments: Masked  Eyes:     Extraocular Movements: Extraocular movements intact.  Cardiovascular:     Rate and Rhythm: Normal rate and regular rhythm.     Heart sounds: Normal heart sounds.  Pulmonary:     Effort: Pulmonary effort is normal.     Breath sounds: Normal breath sounds.  Musculoskeletal:     Cervical back: Normal range of motion.  Skin:    General: Skin is warm.  Neurological:     General: No focal deficit present.     Mental Status: She is alert.  Psychiatric:        Mood and Affect: Mood normal.        Behavior: Behavior normal.        Assessment And Plan:     1. Essential (primary) hypertension Comments: Chronic, fair control. Encouraged to follow low sodium diet. She had labs drawn at [facility] on 11/2 - these were reviewed in full detail.  F/u in 6 months.  2. Mastalgia in female Comments: Persistent. Advised to cut back on her caffeine intake. Diagnostic mammo scheduled in Dec 2022.   3. Class 1 obesity due to excess calories with serious comorbidity and body mass index (BMI) of 30.0 to 30.9 in adult Comments: HEr BMI is acceptable for her demographic. Encouraged to aim for at least 150 minutes of exercise per week.   4. Encounter for HCV screening test for low risk patient - Hepatitis C antibody; Future  5. Immunization due Comments: Emily Greer will send rx Prevnar-20 to her local pharmacy.    Patient was given opportunity to ask questions. Patient verbalized understanding of the plan and was able to repeat key elements of the plan. All questions were answered to their satisfaction.   Emily Greer, Gwynneth Aliment, MD, have reviewed all documentation for this visit. The documentation on 12/02/20 for the exam, diagnosis, procedures, and orders are all accurate and complete.   IF YOU HAVE BEEN REFERRED TO A SPECIALIST, IT MAY TAKE 1-2 WEEKS TO SCHEDULE/PROCESS THE REFERRAL. IF YOU HAVE NOT  HEARD FROM US/SPECIALIST IN TWO WEEKS, PLEASE GIVE  [place] A CALL AT [phone].   THE PATIENT IS ENCOURAGED TO PRACTICE SOCIAL DISTANCING DUE TO THE COVID-19 PANDEMIC.

## 2020-12-02 NOTE — Patient Instructions (Addendum)
Prevnar-20 sent to pharmacy(pneumonia shot) Please get hepatitis C testing done with Dr. Marin Olp Increase water intake Moisturize area on side of breast No regular coffee for two weeks. Please call Dr. Baird Cancer in ONE week 11/10 to let her know how your breast is feeling.  Call [facility] to check for cancellations  Cooking With Less Salt Cooking with less salt is one way to reduce the amount of sodium you get from food. Sodium is one of the elements that make up salt. It is found naturally in foods and is also added to certain foods. Depending on your condition and overall health, your health care provider or dietitian may recommend that you reduce your sodium intake. Most people should have less than 2,300 milligrams (mg) of sodium each day. If you have high blood pressure (hypertension), you may need to limit your sodium to 1,500 mg each day. Follow the tips below to help reduce your sodium intake. What are tips for eating less sodium? Reading food labels  Check the food label before buying or using packaged ingredients. Always check the label for the serving size and sodium content. Look for products with no more than 140 mg of sodium in one serving. Check the % Daily Value column to see what percent of the daily recommended amount of sodium is provided in one serving of the product. Foods with 5% or less in this column are considered low in sodium. Foods with 20% or higher are considered high in sodium. Do not choose foods with salt as one of the first three ingredients on the ingredients list. If salt is one of the first three ingredients, it usually means the item is high in sodium. Shopping Buy sodium-free or low-sodium products. Look for the following words on food labels: Low-sodium. Sodium-free. Reduced-sodium. No salt added. Unsalted. Always check the sodium content even if foods are labeled as low-sodium or no salt added. Buy fresh foods. Cooking Use herbs, seasonings  without salt, and spices as substitutes for salt. Use sodium-free baking soda when baking. Grill, braise, or roast foods to add flavor with less salt. Avoid adding salt to pasta, rice, or hot cereals. Drain and rinse canned vegetables, beans, and meat before use. Avoid adding salt when cooking sweets and desserts. Cook with low-sodium ingredients. What foods are high in sodium? Vegetables Regular canned vegetables (not low-sodium or reduced-sodium). Sauerkraut, pickled vegetables, and relishes. Olives. [place] fries. Onion rings. Regular canned tomato sauce and paste. Regular tomato and vegetable juice. Frozen vegetables in sauces. Grains Instant hot cereals. Bread stuffing, pancake, and biscuit mixes. Croutons. Seasoned rice or pasta mixes. Noodle soup cups. Boxed or frozen macaroni and cheese. Regular salted crackers. Self-rising flour. Rolls. Bagels. Flour tortillas and wraps. Meats and other proteins Meat or fish that is salted, canned, smoked, cured, spiced, or pickled. This includes bacon, ham, sausages, hot dogs, corned beef, chipped beef, meat loaves, salt pork, jerky, pickled herring, anchovies, regular canned tuna, and sardines. Salted nuts. Dairy Processed cheese and cheese spreads. Cheese curds. Blue cheese. Feta cheese. String cheese. Regular cottage cheese. Buttermilk. Canned milk. The items listed above may not be a complete list of foods high in sodium. Actual amounts of sodium may be different depending on processing. Contact a dietitian for more information. What foods are low in sodium? Fruits Fresh, frozen, or canned fruit with no sauce added. Fruit juice. Vegetables Fresh or frozen vegetables with no sauce added. "No salt added" canned vegetables. "No salt added" tomato sauce and paste. Low-sodium  or reduced-sodium tomato and vegetable juice. Grains Noodles, pasta, quinoa, rice. Shredded or puffed wheat or puffed rice. Regular or quick oats (not instant). Low-sodium  crackers. Low-sodium bread. Whole-grain bread and whole-grain pasta. Unsalted popcorn. Meats and other proteins Fresh or frozen whole meats, poultry (not injected with sodium), and fish with no sauce added. Unsalted nuts. Dried peas, beans, and lentils without added salt. Unsalted canned beans. Eggs. Unsalted nut butters. Low-sodium canned tuna or chicken. Dairy Milk. Soy milk. Yogurt. Low-sodium cheeses, such as Swiss, Monterey Jack, [place], and [employer]. Sherbet or ice cream (keep to  cup per serving). Cream cheese. Fats and oils Unsalted butter or margarine. Other foods Homemade pudding. Sodium-free baking soda and baking powder. Herbs and spices. Low-sodium seasoning mixes. Beverages Coffee and tea. Carbonated beverages. The items listed above may not be a complete list of foods low in sodium. Actual amounts of sodium may be different depending on processing. Contact a dietitian for more information. What are some salt alternatives when cooking? The following are herbs, seasonings, and spices that can be used instead of salt to flavor your food. Herbs should be fresh or dried. Do not choose packaged mixes. Next to the name of the herb, spice, or seasoning are some examples of foods you can pair it with. Herbs Bay leaves - Soups, meat and vegetable dishes, and spaghetti sauce. Basil - [employer], soups, pasta, and fish dishes. Cilantro - Meat, poultry, and vegetable dishes. Chili powder - Marinades and Mexican dishes. Chives - Salad dressings and potato dishes. Cumin - Mexican dishes, couscous, and meat dishes. Dill - Fish dishes, sauces, and salads. Fennel - Meat and vegetable dishes, breads, and cookies. Garlic (do not use garlic salt) - [place] dishes, meat dishes, salad dressings, and sauces. Marjoram - Soups, potato dishes, and meat dishes. Oregano - Pizza and spaghetti sauce. Parsley - Salads, soups, pasta, and meat dishes. Rosemary - [place] dishes, salad dressings,  soups, and red meats. Saffron - Fish dishes, pasta, and some poultry dishes. Sage - Stuffings and sauces. Tarragon - Fish and [employer]. Thyme - Stuffing, meat, and fish dishes. Seasonings Lemon juice - Fish dishes, poultry dishes, vegetables, and salads. Vinegar - Salad dressings, vegetables, and fish dishes. Spices Cinnamon - Sweet dishes, such as cakes, cookies, and puddings. Cloves - Gingerbread, puddings, and marinades for meats. Curry - Vegetable dishes, fish and poultry dishes, and stir-fry dishes. Ginger - Vegetable dishes, fish dishes, and stir-fry dishes. Nutmeg - Pasta, vegetables, poultry, fish dishes, and custard. Summary Cooking with less salt is one way to reduce the amount of sodium that you get from food. Buy sodium-free or low-sodium products. Check the food label before using or buying packaged ingredients. Use herbs, seasonings without salt, and spices as substitutes for salt in foods. This information is not intended to replace advice given to you by your health care provider. Make sure you discuss any questions you have with your health care provider. Document Revised: 01/08/2019 Document Reviewed: 01/08/2019 Elsevier Patient Education  [place].

## 2020-12-06 ENCOUNTER — Telehealth: Payer: Medicare Other

## 2020-12-06 ENCOUNTER — Telehealth: Payer: Self-pay

## 2020-12-06 LAB — PROTEIN ELECTROPHORESIS, SERUM, WITH REFLEX
A/G Ratio: 1.4 (ref 0.7–1.7)
Albumin ELP: 3.4 g/dL (ref 2.9–4.4)
Alpha-1-Globulin: 0.3 g/dL (ref 0.0–0.4)
Alpha-2-Globulin: 0.9 g/dL (ref 0.4–1.0)
Beta Globulin: 1.1 g/dL (ref 0.7–1.3)
Gamma Globulin: 0.2 g/dL — ABNORMAL LOW (ref 0.4–1.8)
Globulin, Total: 2.5 g/dL (ref 2.2–3.9)
Total Protein ELP: 5.9 g/dL — ABNORMAL LOW (ref 6.0–8.5)

## 2020-12-06 NOTE — Telephone Encounter (Signed)
Care Management   Follow Up Note   12/06/2020 Name: Emily Greer MRN: [id number] DOB: Dec 16, 1950   Referred by: [name], MD Reason for referral : Chronic Care Management (RN CM Follow up call )   An unsuccessful telephone outreach was attempted today. The patient was referred to the case management team for assistance with care management and care coordination.   Follow Up Plan: A HIPPA compliant phone message was left for the patient providing contact information and requesting a return call.   Delsa Sale, RN, BSN, CCM Care Management Coordinator [facility]  Direct Phone: [phone]

## 2020-12-14 ENCOUNTER — Telehealth: Payer: Self-pay

## 2020-12-14 ENCOUNTER — Telehealth: Payer: Medicare Other

## 2020-12-14 ENCOUNTER — Ambulatory Visit (INDEPENDENT_AMBULATORY_CARE_PROVIDER_SITE_OTHER): Payer: Medicare Other

## 2020-12-14 DIAGNOSIS — I10 Essential (primary) hypertension: Secondary | ICD-10-CM

## 2020-12-14 DIAGNOSIS — C9000 Multiple myeloma not having achieved remission: Secondary | ICD-10-CM

## 2020-12-14 DIAGNOSIS — E6609 Other obesity due to excess calories: Secondary | ICD-10-CM

## 2020-12-14 NOTE — Telephone Encounter (Signed)
Care Management   Follow Up Note   12/14/2020 Name: Emily Greer MRN: [id number] DOB: 1950/10/21   Referred by: [name], MD Reason for referral : Chronic Care Management (RN CM Follow up call )   A second unsuccessful telephone outreach was attempted today. The patient was referred to the case management team for assistance with care management and care coordination. I spoke with Emily Greer briefly today, unfortunately she is not available to speak with me but would like a call back a Skye Plamondon later today.   Follow Up Plan: Telephone follow up appointment with care management team member scheduled for:  Delsa Sale, RN, BSN, CCM Care Management Coordinator [facility]  Direct Phone: [phone]

## 2020-12-16 NOTE — Patient Instructions (Signed)
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  Knowledge Deficits related to short term plan for care coordination needs and long term plans for chronic disease management needs Nurse Case Manager Clinical Goal(s):  Patient will work with care management team to address care coordination and chronic disease management needs related to Disease Management Educational Needs Care Coordination Medication Management and Education Psychosocial Support Caregiver Stress support   Interventions:  Collaboration with Glendale Chard, MD regarding development and update of comprehensive plan of care as evidenced by provider attestation and co-signature Inter-disciplinary care team collaboration (see longitudinal plan of care) Assessed severity of fatigue using a validated scale, quantitative (1 to 10) or word scale (mild, moderate, severe) at initial visit, at regular intervals and following treatment.  Assessed for causative factors, such as chemotherapy or radiation therapy, onset, pattern, duration, change over time, alleviating and contributing factors, as well as degree of interference with daily function.  Encouraged physical activity, as appropriate, both aerobic and strength-building; consider beginning with low-level activities and increase as conditioning and confidence improves.  Assessed degree of interference with daily function, presence of treatment effects such as anemia, bone metastases, fever or infection,  history or risk of falls and conditioning level.  Offered reassurance that treatment-related fatigue is not necessarily an indicator of disease progression.  Educated patient on the importance of implementing a routine exercise regimen in order to help manage fatigue Discussed plans with patient for ongoing care management follow up and provided patient with direct contact information for care management team Patient Goals/Self Care Activities:  - eat healthy - get a least 8 hours of sleep at night - maintain healthy weight - take a warm shower or bath before bed - use meditation or relaxation techniques  Follow Up Plan: Telephone follow up appointment with care management team member scheduled for: 02/14/21        Problem: Infection   Priority: High     Long-Range Goal: Infection Prevented or Managed   Start Date: 01/13/2020  Expected End Date: 01/12/2021  Recent Progress: On track  Priority: High  Note:   Current Barriers:  Ineffective Self Health Maintenance 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  Knowledge Deficits related to short term plan for care coordination needs and long term plans for chronic disease management needs Nurse Case Manager Clinical Goal(s):  Patient will work with care management team to address care coordination and chronic disease management needs related to Disease Management Educational Needs Care Coordination Medication Management and Education Psychosocial Support Caregiver Stress support   Interventions:  Collaboration with Glendale Chard, MD regarding development and update of comprehensive plan of care as evidenced by provider attestation and co-signature Inter-disciplinary care team collaboration (see longitudinal plan of care) Assessed for ongoing Cancer treatment per Oncology Review of patient status, including review of consultants reports,  relevant laboratory and other test results, and medications completed. Reviewed medications with patient and discussed importance of  medication adherence Assessed for signs/symptoms of infection, patient denies recent infection Educated on importance to keep vaccinations up to date and to avoid other's who may be ill Reviewed signs/symptoms of infection and urged patient to report any/all symptoms promptly Educated patient on the importance to drink plenty of water to stay well hydrated and keep kidneys functioning well, eat a well balanced diet and try to get a full nights sleep each night to help maintain good overall health  Discussed plans with patient for ongoing care management follow up and provided patient with direct contact information for care management team Patient Goals/Self Care Activities:   - notify MD of any/all symptoms suggestive of infection promptly - stay well hydrated  - get plenty of rest - eat a healthy diet - avoid being around others who may be ill   Follow Up Plan: Telephone follow up appointment with care management team member scheduled for: 02/14/21        Care Plan : Hypertension (Adult)  Updates made by Lynne Logan, RN since 12/16/2020 12:00 AM     Problem: Disease Progression (Hypertension)   Priority: High     Long-Range Goal: Disease Progression Prevented or Minimized   Start Date: 01/13/2020  Expected End Date: 01/12/2021  Recent Progress: On track  Priority: High  Note:   Hypertension Interventions: Last practice recorded BP readings:  BP Readings from Last 3 Encounters:  12/02/20 132/80  12/01/20 (!) 147/80  11/18/20 134/78  Most recent eGFR/CrCl:  Lab Results  Component Value Date   EGFR 86 (L) 06/21/2016    No components found for: CRCL  Current Barriers:  Knowledge Deficits related to basic understanding of hypertension pathophysiology and self care management Caregiver for spouse and elderly mother  Case Manager Clinical  Goal(s):  Patient will demonstrate improved adherence to prescribed treatment plan for hypertension as evidenced by taking all medications as prescribed, monitoring and recording blood pressure as directed, adhering to low sodium/DASH diet Interventions:  Collaboration with Glendale Chard, MD regarding development and update of comprehensive plan of care as evidenced by provider attestation and co-signature Inter-disciplinary care team collaboration (see longitudinal plan of care) Evaluation of current treatment plan related to hypertension self management and patient's adherence to plan as established by provider. Provided education to patient re: stroke prevention, s/s of heart attack and stroke, DASH diet, complications of uncontrolled blood pressure Reviewed medications with patient and discussed importance of compliance Advised patient, providing education and rationale, to monitor blood pressure daily and record, calling PCP for findings outside established parameters.  Instructed patient on how to accurately measure her BP at home; Mailed printed instructions  Discussed plans with patient for ongoing care management follow up and provided patient with direct contact information for care management team Patient Goals/Self-Care Activities Self administers medications as prescribed Attends all scheduled provider appointments Calls provider office for new concerns, questions, or BP outside discussed parameters Checks BP and records as discussed Follows a low sodium diet/DASH diet - check blood pressure 3 times per week - write blood pressure results in a log or diary  Follow Up Plan: Telephone follow up appointment with care management team member scheduled for: 02/14/21      Consent to CCM Services: Ms. Blizard was given information about Chronic Care Management services including:  CCM service includes personalized support from designated clinical staff supervised by her physician,  including individualized plan of care and coordination with other care providers 24/7 contact phone numbers for assistance for urgent  and routine care needs. Service will only be billed when office clinical staff spend 20 minutes or more in a month to coordinate care. Only one practitioner may furnish and bill the service in a calendar month. The patient may stop CCM services at any time (effective at the end of the month) by phone call to the office staff. The patient will be responsible for cost sharing (co-pay) of up to 20% of the service fee (after annual deductible is met).  Patient agreed to services and verbal consent obtained.   The patient verbalized understanding of instructions, educational materials, and care plan provided today and declined offer to receive copy of patient instructions, educational materials, and care plan.   Telephone follow up appointment with care management team member scheduled for: 02/14/21  Barb Merino, RN, BSN, CCM Care Management Coordinator [facility]  Direct Phone: [phone]

## 2020-12-16 NOTE — Chronic Care Management (AMB) (Signed)
Chronic Care Management   CCM RN Visit Note  12/14/2020 Name: Emily Greer MRN: [id number] DOB: Jul 23, 1950  Subjective: Emily Greer is a 70 y.o. year old female who is a primary care patient of [name], MD. The care management team was consulted for assistance with disease management and care coordination needs.    Engaged with patient by telephone for follow up visit in response to provider referral for case management and/or care coordination services.   Consent to Services:  The patient was given information about Chronic Care Management services, agreed to services, and gave verbal consent prior to initiation of services.  Please see initial visit note for detailed documentation.   Patient agreed to services and verbal consent obtained.   Assessment: Review of patient past medical history, allergies, medications, health status, including review of consultants reports, laboratory and other test data, was performed as part of comprehensive evaluation and provision of chronic care management services.   SDOH (Social Determinants of Health) assessments and interventions performed:  Yes, no acute challenges   CCM Care Plan  Allergies  Allergen Reactions   Codeine Nausea Only    Outpatient Encounter Medications as of 12/14/2020  Medication Sig Note   acyclovir (ZOVIRAX) 400 MG tablet TAKE 1 TABLET(400 MG) BY MOUTH TWICE DAILY    albuterol (VENTOLIN HFA) 108 (90 Base) MCG/ACT inhaler Inhale 2 puffs into the lungs every 6 (six) hours as needed for wheezing or shortness of breath. 2 puffs 3 times daily x 5 days then every 6 hours as needed.    amLODipine (NORVASC) 10 MG tablet TAKE 1 TABLET(10 MG) BY MOUTH EVERY MORNING    aspirin EC 81 MG tablet Take 81 mg by mouth daily.    benzonatate (TESSALON PERLES) 100 MG capsule Take 1 capsule (100 mg total) by mouth 3 (three) times daily as needed for cough.    Calcium-Magnesium-Vitamin D (CALCIUM 1200+D3 PO) Take 1 tablet by  mouth daily.    Carboxymethylcell-Glycerin PF 0.5-0.9 % SOLN Place 2 drops into both eyes 4 (four) times daily.    cetirizine (ZYRTEC) 10 MG tablet Take 1 tablet (10 mg total) by mouth at bedtime.    Cholecalciferol (VITAMIN D3) 2000 UNITS TABS Take 2 tablets by mouth daily. 05/20/2020: Patient stated she is taking this twice a day.   Fluocinolone Acetonide Scalp 0.01 % OIL Apply topically.    guaiFENesin-dextromethorphan (ROBITUSSIN DM) 100-10 MG/5ML syrup Take 5 mLs by mouth every 4 (four) hours as needed for cough.    hydrochlorothiazide (MICROZIDE) 12.5 MG capsule Take 12.5 mg by mouth every morning.    letrozole (FEMARA) 2.5 MG tablet Take 1 tablet (2.5 mg total) by mouth daily.    lidocaine-prilocaine (EMLA) cream Apply 1 application topically as needed.    loratadine (CLARITIN) 10 MG tablet Take 10 mg by mouth every morning. Reported on 08/19/2015    metoprolol succinate (TOPROL-XL) 50 MG 24 hr tablet TAKE 1 TABLET(50 MG) BY MOUTH EVERY MORNING    montelukast (SINGULAIR) 10 MG tablet TAKE 1 TABLET(10 MG) BY MOUTH AT BEDTIME    ondansetron (ZOFRAN) 8 MG tablet Take 1 tablet (8 mg total) by mouth 2 (two) times daily as needed (Nausea or vomiting).    potassium chloride SA (KLOR-CON) 20 MEQ tablet TAKE 1 TABLET(20 MEQ) BY MOUTH TWICE DAILY    Probiotic Product (PROBIOTIC DAILY PO) Take by mouth daily.    triamcinolone (NASACORT) 55 MCG/ACT AERO nasal inhaler Place 2 sprays into the nose as needed.  triamterene-hydrochlorothiazide (MAXZIDE-25) 37.5-25 MG tablet 1 tablet by mouth at bedtime    XIIDRA 5 % SOLN     zinc gluconate 50 MG tablet Take 50 mg by mouth daily.    Facility-Administered Encounter Medications as of 12/14/2020  Medication   0.9 %  sodium chloride infusion   albuterol (PROVENTIL) (2.5 MG/3ML) 0.083% nebulizer solution 2.5 mg   sodium chloride flush (NS) 0.9 % injection 10 mL   sodium chloride flush (NS) 0.9 % injection 10 mL   sodium chloride flush (NS) 0.9 % injection  10 mL   sodium chloride flush (NS) 0.9 % injection 10 mL   sodium chloride flush (NS) 0.9 % injection 10 mL    Patient Active Problem List   Diagnosis Date Noted   Goals of care, counseling/discussion 08/21/2019   Osteonecrosis, unspecified (HCC) 03/31/2019   VAIN I (vaginal intraepithelial neoplasia grade I) 10/10/2018   Influenza B 02/18/2018   Flu-like symptoms 02/18/2018   Excessive cerumen in both ear canals 02/18/2018   Abnormal Pap smear of vagina 12/30/2015   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) 01/29/2015   Iron deficiency anemia due to chronic blood loss    HCAP (healthcare-associated pneumonia) 12/29/2014   UTI (lower urinary tract infection) 12/29/2014   Hypokalemia 12/29/2014   CAP (community acquired pneumonia) 12/29/2014   Bone marrow transplant status (HCC) 07/25/2014   S/P autologous bone marrow transplantation (HCC) 07/25/2014   Fever 05/31/2014   ARF (acute renal failure) (HCC) 05/31/2014   Anemia 05/31/2014   Renal failure    Examination of participant in clinical trial 05/19/2014   Encounter for examination for normal comparison or control in clinical research program 05/19/2014   Kahler disease (HCC) 05/05/2014   Multiple myeloma in remission (HCC) 05/05/2014   History of radiation therapy    Endometrial carcinoma (HCC) 09/28/2011   Pregnancy induced hypertension    Elevated hemoglobin A1c    Myeloma (HCC) 01/17/2011   Lambda light chain myeloma (HCC) 11/11/2007   Vaginal atrophy 12/19/2006   Increased BMI 12/18/2005   Hypertension 11/28/1997   H/O multiple myeloma 11/28/1997   Staphylococcus aureus bacteremia 11/28/1997   Staphylococcus epidermidis bacteremia 11/28/1997   Essential (primary) hypertension 11/28/1997    Conditions to be addressed/monitored: Essential Hypertension, Class 1 Obesity, Multiple Myeloma  Care Plan : Cancer Treatment Phase (Adult)  Updates made by Riley Churches, RN since 12/14/2020  12:00 AM     Problem: Fatigue   Priority: High     Long-Range Goal: Fatigue Managed   Start Date: 01/13/2020  Expected End Date: 01/12/2021  Recent Progress: On track  Priority: High  Note:   Current Barriers:  Ineffective Self Health Maintenance 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  Knowledge Deficits related to short term plan for care coordination needs and long term plans for chronic disease management needs Nurse Case Manager Clinical Goal(s):  Patient will work with care management team to address care coordination and chronic disease management needs related to Disease Management Educational Needs Care Coordination Medication Management and Education Psychosocial Support Caregiver Stress support   Interventions:  Collaboration with [name], MD regarding development and update of comprehensive plan of care as evidenced by provider attestation and co-signature Inter-disciplinary care team collaboration (see longitudinal plan of care) Assessed severity of fatigue using a validated scale, quantitative (1 to 10) or word scale (mild, moderate, severe) at initial visit, at regular intervals and  following treatment.  Assessed for causative factors, such as chemotherapy or radiation therapy, onset, pattern, duration, change over time, alleviating and contributing factors, as well as degree of interference with daily function.  Encouraged physical activity, as appropriate, both aerobic and strength-building; consider beginning with low-level activities and increase as conditioning and confidence improves.  Assessed degree of interference with daily function, presence of treatment effects such as anemia, bone metastases, fever or infection, history or risk of falls and conditioning level.  Offered reassurance that treatment-related fatigue is not necessarily an indicator of disease  progression.  Educated patient on the importance of implementing a routine exercise regimen in order to help manage fatigue Discussed plans with patient for ongoing care management follow up and provided patient with direct contact information for care management team Patient Goals/Self Care Activities:  - eat healthy - get a least 8 hours of sleep at night - maintain healthy weight - take a warm shower or bath before bed - use meditation or relaxation techniques  Follow Up Plan: Telephone follow up appointment with care management team member scheduled for: 02/14/21    Problem: Infection   Priority: High     Long-Range Goal: Infection Prevented or Managed   Start Date: 01/13/2020  Expected End Date: 01/12/2021  Recent Progress: On track  Priority: High  Note:   Current Barriers:  Ineffective Self Health Maintenance 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  Knowledge Deficits related to short term plan for care coordination needs and long term plans for chronic disease management needs Nurse Case Manager Clinical Goal(s):  Patient will work with care management team to address care coordination and chronic disease management needs related to Disease Management Educational Needs Care Coordination Medication Management and Education Psychosocial Support Caregiver Stress support   Interventions:  Collaboration with [name], MD regarding development and update of comprehensive plan of care as evidenced by provider attestation and co-signature Inter-disciplinary care team collaboration (see longitudinal plan of care) Assessed for ongoing Cancer treatment per Oncology Review of patient status, including review of consultants reports, relevant laboratory and other test results, and medications completed. Reviewed medications with patient and discussed importance of medication  adherence Assessed for signs/symptoms of infection, patient denies recent infection Educated on importance to keep vaccinations up to date and to avoid other's who may be ill Reviewed signs/symptoms of infection and urged patient to report any/all symptoms promptly Educated patient on the importance to drink plenty of water to stay well hydrated and keep kidneys functioning well, eat a well balanced diet and try to get a full nights sleep each night to help maintain good overall health  Discussed plans with patient for ongoing care management follow up and provided patient with direct contact information for care management team Patient Goals/Self Care Activities:   - notify MD of any/all symptoms suggestive of infection promptly - stay well hydrated  - get plenty of rest - eat a healthy diet - avoid being around others who may be ill   Follow Up Plan: Telephone follow up appointment with care management team member scheduled for: 02/14/21    Care Plan : Hypertension (Adult)  Updates made by Riley Churches, RN since 12/14/2020 12:00 AM     Problem: Disease Progression (Hypertension)   Priority: High     Long-Range Goal: Disease Progression Prevented or Minimized   Start Date: 01/13/2020  Expected End Date: 01/12/2021  Recent Progress: On  track  Priority: High  Note:   Hypertension Interventions: Last practice recorded BP readings:  BP Readings from Last 3 Encounters:  12/02/20 132/80  12/01/20 (!) 147/80  11/18/20 134/78  Most recent eGFR/CrCl:  Lab Results  Component Value Date   EGFR 86 (L) 06/21/2016    No components found for: CRCL  Current Barriers:  Knowledge Deficits related to basic understanding of hypertension pathophysiology and self care management Caregiver for spouse and elderly mother  Case Manager Clinical Goal(s):  Patient will demonstrate improved adherence to prescribed treatment plan for hypertension as evidenced by taking all medications as  prescribed, monitoring and recording blood pressure as directed, adhering to low sodium/DASH diet Interventions:  Collaboration with [name], MD regarding development and update of comprehensive plan of care as evidenced by provider attestation and co-signature Inter-disciplinary care team collaboration (see longitudinal plan of care) Evaluation of current treatment plan related to hypertension self management and patient's adherence to plan as established by provider. Provided education to patient re: stroke prevention, s/s of heart attack and stroke, DASH diet, complications of uncontrolled blood pressure Reviewed medications with patient and discussed importance of compliance Advised patient, providing education and rationale, to monitor blood pressure daily and record, calling PCP for findings outside established parameters.  Instructed patient on how to accurately measure her BP at home; Mailed printed instructions  Discussed plans with patient for ongoing care management follow up and provided patient with direct contact information for care management team Patient Goals/Self-Care Activities Self administers medications as prescribed Attends all scheduled provider appointments Calls provider office for new concerns, questions, or BP outside discussed parameters Checks BP and records as discussed Follows a low sodium diet/DASH diet - check blood pressure 3 times per week - write blood pressure results in a log or diary  Follow Up Plan: Telephone follow up appointment with care management team member scheduled for: 02/14/21      Plan:Telephone follow up appointment with care management team member scheduled for:  02/14/21  Delsa Sale, RN, BSN, CCM Care Management Coordinator [facility] Care Management/[facility]  Direct Phone: [phone]

## 2020-12-22 ENCOUNTER — Other Ambulatory Visit: Payer: Self-pay

## 2020-12-22 ENCOUNTER — Inpatient Hospital Stay: Payer: Medicare Other

## 2020-12-22 ENCOUNTER — Inpatient Hospital Stay (HOSPITAL_BASED_OUTPATIENT_CLINIC_OR_DEPARTMENT_OTHER): Payer: Medicare Other | Admitting: Hematology & Oncology

## 2020-12-22 ENCOUNTER — Encounter: Payer: Self-pay | Admitting: Hematology & Oncology

## 2020-12-22 VITALS — BP 141/74 | HR 77 | Temp 98.9°F | Resp 16 | Wt 174.0 lb

## 2020-12-22 DIAGNOSIS — C9000 Multiple myeloma not having achieved remission: Secondary | ICD-10-CM

## 2020-12-22 DIAGNOSIS — H538 Other visual disturbances: Secondary | ICD-10-CM | POA: Diagnosis not present

## 2020-12-22 DIAGNOSIS — Z8542 Personal history of malignant neoplasm of other parts of uterus: Secondary | ICD-10-CM | POA: Diagnosis not present

## 2020-12-22 DIAGNOSIS — C7982 Secondary malignant neoplasm of genital organs: Secondary | ICD-10-CM

## 2020-12-22 DIAGNOSIS — M7989 Other specified soft tissue disorders: Secondary | ICD-10-CM | POA: Diagnosis not present

## 2020-12-22 DIAGNOSIS — Z5112 Encounter for antineoplastic immunotherapy: Secondary | ICD-10-CM | POA: Diagnosis not present

## 2020-12-22 DIAGNOSIS — D509 Iron deficiency anemia, unspecified: Secondary | ICD-10-CM | POA: Diagnosis not present

## 2020-12-22 DIAGNOSIS — C541 Malignant neoplasm of endometrium: Secondary | ICD-10-CM

## 2020-12-22 LAB — CBC WITH DIFFERENTIAL (CANCER CENTER ONLY)
Abs Immature Granulocytes: 0.03 10*3/uL (ref 0.00–0.07)
Basophils Absolute: 0.1 10*3/uL (ref 0.0–0.1)
Basophils Relative: 1 %
Eosinophils Absolute: 0.1 10*3/uL (ref 0.0–0.5)
Eosinophils Relative: 2 %
HCT: 31.9 % — ABNORMAL LOW (ref 36.0–46.0)
Hemoglobin: 10.4 g/dL — ABNORMAL LOW (ref 12.0–15.0)
Immature Granulocytes: 1 %
Lymphocytes Relative: 13 %
Lymphs Abs: 0.7 10*3/uL (ref 0.7–4.0)
MCH: 25.6 pg — ABNORMAL LOW (ref 26.0–34.0)
MCHC: 32.6 g/dL (ref 30.0–36.0)
MCV: 78.6 fL — ABNORMAL LOW (ref 80.0–100.0)
Monocytes Absolute: 0.7 10*3/uL (ref 0.1–1.0)
Monocytes Relative: 13 %
Neutro Abs: 3.9 10*3/uL (ref 1.7–7.7)
Neutrophils Relative %: 70 %
Platelet Count: 161 10*3/uL (ref 150–400)
RBC: 4.06 MIL/uL (ref 3.87–5.11)
RDW: 19.7 % — ABNORMAL HIGH (ref 11.5–15.5)
WBC Count: 5.5 10*3/uL (ref 4.0–10.5)
nRBC: 0 % (ref 0.0–0.2)

## 2020-12-22 LAB — IRON AND TIBC
Iron: 55 ug/dL (ref 41–142)
Saturation Ratios: 16 % — ABNORMAL LOW (ref 21–57)
TIBC: 345 ug/dL (ref 236–444)
UIBC: 290 ug/dL (ref 120–384)

## 2020-12-22 LAB — CMP (CANCER CENTER ONLY)
ALT: 15 U/L (ref 0–44)
AST: 23 U/L (ref 15–41)
Albumin: 4.1 g/dL (ref 3.5–5.0)
Alkaline Phosphatase: 61 U/L (ref 38–126)
Anion gap: 9 (ref 5–15)
BUN: 14 mg/dL (ref 8–23)
CO2: 29 mmol/L (ref 22–32)
Calcium: 10 mg/dL (ref 8.9–10.3)
Chloride: 102 mmol/L (ref 98–111)
Creatinine: 0.79 mg/dL (ref 0.44–1.00)
GFR, Estimated: >60 mL/min (ref >60)
Glucose, Bld: 106 mg/dL — ABNORMAL HIGH (ref 70–99)
Potassium: 3.3 mmol/L — ABNORMAL LOW (ref 3.5–5.1)
Sodium: 140 mmol/L (ref 135–145)
Total Bilirubin: 0.3 mg/dL (ref 0.3–1.2)
Total Protein: 6.5 g/dL (ref 6.5–8.1)

## 2020-12-22 LAB — FERRITIN: Ferritin: 8819 ng/mL — ABNORMAL HIGH (ref 11–307)

## 2020-12-22 LAB — LACTATE DEHYDROGENASE: LDH: 393 U/L — ABNORMAL HIGH (ref 98–192)

## 2020-12-22 NOTE — Patient Instructions (Signed)

## 2020-12-24 LAB — KAPPA/LAMBDA LIGHT CHAINS
Kappa free light chain: 1.7 mg/L — ABNORMAL LOW (ref 3.3–19.4)
Kappa, lambda light chain ratio: 0.02 — ABNORMAL LOW (ref 0.26–1.65)
Lambda free light chains: 70.9 mg/L — ABNORMAL HIGH (ref 5.7–26.3)

## 2020-12-27 ENCOUNTER — Encounter: Payer: Self-pay | Admitting: Hematology & Oncology

## 2020-12-27 LAB — PROTEIN ELECTROPHORESIS, SERUM, WITH REFLEX
A/G Ratio: 1.3 (ref 0.7–1.7)
Albumin ELP: 3.3 g/dL (ref 2.9–4.4)
Alpha-1-Globulin: 0.3 g/dL (ref 0.0–0.4)
Alpha-2-Globulin: 1 g/dL (ref 0.4–1.0)
Beta Globulin: 1.1 g/dL (ref 0.7–1.3)
Gamma Globulin: 0.2 g/dL — ABNORMAL LOW (ref 0.4–1.8)
Globulin, Total: 2.6 g/dL (ref 2.2–3.9)
Total Protein ELP: 5.9 g/dL — ABNORMAL LOW (ref 6.0–8.5)

## 2020-12-27 NOTE — Progress Notes (Signed)
Hematology and Oncology Follow Up Visit  Emily Greer [id number] Nov 01, 1950 70 y.o. 12/27/2020   Principle Diagnosis:  Recurrent lambda light chain myeloma - nl cytogenetics History of recurrent endometrial carcinoma Iron deficiency anemia -blood loss   Past Therapy:             Status post second autologous stem cell transplant on 07/24/2014 Maintenance therapy with Pomalidomide/every 2 week Velcade - d/c'ed Radiation therapy for endometrial recurrence - completed 04/20/2015 Pomalyst/Kyprolis 70mg /m2 IV q 2 weeks - s/p cycle #12 - held on 12/26/2017 for hematuria Daratumumab/Pomalyst (1 mg) - status post cycle 19 -- d/c on 08/21/2019 Melflufen 40 mg IV q 4 weeks -- started on 08/27/2019, s/p cycle #2 --  D/c due to FDA removal Selinexor 60 mg po q week -- start on 01/19/2020 -- changed on 03/18/2020 -- d/c on 04/27/2020   Current Therapy:        Blenrep 2.5 mg/m2 IV q 3 weeks -- started on 05/20/2020, s/p cycle #7 --D/C secondary to progression on 12/22/2020 Carfilzomib/Sarclisa -- start cycle #1 on 01/03/2021 Femara 2.5 mg po q day  Xgeva 120 mg subcu every 3 months - next dose in 01/2021  IV iron as indicated   Interim History:  Emily Greer is here today for follow-up.  Unfortunately, I think that she is starting to progress.  Her lambda light chain has been going up.  Back in November, her lambda light chain was 71.4 mg/L.  Prior to this it was 47 mg/L.  She has been having issues with her cornea.  She is post to have cataract surgery.  I still think she could have cataract surgery.  I think were going to have to make a change.  I think that she has not seen Carfilzomib for 3 years.  She also has never been on [place].  I think the combination of the 2 would be a good treatment for her.  I spoke with Emily Greer at [facility].  I think ultimately, we may try to get her on teclistamab.  However, Blenrep is also a BCMA inhibitor.  As such, I do not want to get her onto  another BCMA inhibitor immediately.  I do think that she would benefit and respond to the carfilzomib/Sarclisa combination.  Otherwise, she is doing okay.  She is going to be busy over Thanksgiving.  She has had no problems with nausea or vomiting.  Is been no change in bowel or bladder habits..  She is on Femara for the uterine cancer.  This was not been an issue for [place].  She has had no bleeding.  There has been a little bit of leg swelling.  Overall, I would say her performance status is ECOG 1.    Medications:  Allergies as of 12/22/2020       Reactions   Codeine Nausea Only        Medication List        Accurate as of December 22, 2020 11:59 PM. If you have any questions, ask your nurse or doctor.          STOP taking these medications    benzonatate 100 MG capsule Commonly known as: Lawyer Stopped by: Emily Macho, MD       TAKE these medications    acyclovir 400 MG tablet Commonly known as: ZOVIRAX TAKE 1 TABLET(400 MG) BY MOUTH TWICE DAILY   albuterol 108 (90 Base) MCG/ACT inhaler Commonly known as: VENTOLIN HFA Inhale 2 puffs into  the lungs every 6 (six) hours as needed for wheezing or shortness of breath. 2 puffs 3 times daily x 5 days then every 6 hours as needed.   amLODipine 10 MG tablet Commonly known as: NORVASC TAKE 1 TABLET(10 MG) BY MOUTH EVERY MORNING   aspirin EC 81 MG tablet Take 81 mg by mouth daily.   CALCIUM 1200+D3 PO Take 1 tablet by mouth daily.   Carboxymethylcell-Glycerin PF 0.5-0.9 % Soln Place 2 drops into both eyes 4 (four) times daily.   cetirizine 10 MG tablet Commonly known as: ZYRTEC Take 1 tablet (10 mg total) by mouth at bedtime.   Fluocinolone Acetonide Scalp 0.01 % Oil Apply topically.   guaiFENesin-dextromethorphan 100-10 MG/5ML syrup Commonly known as: ROBITUSSIN DM Take 5 mLs by mouth every 4 (four) hours as needed for cough.   hydrochlorothiazide 12.5 MG capsule Commonly known as:  MICROZIDE Take 12.5 mg by mouth every morning.   letrozole 2.5 MG tablet Commonly known as: FEMARA Take 1 tablet (2.5 mg total) by mouth daily.   lidocaine-prilocaine cream Commonly known as: EMLA Apply 1 application topically as needed.   loratadine 10 MG tablet Commonly known as: CLARITIN Take 10 mg by mouth every morning. Reported on 08/19/2015   metoprolol succinate 50 MG 24 hr tablet Commonly known as: TOPROL-XL TAKE 1 TABLET(50 MG) BY MOUTH EVERY MORNING   montelukast 10 MG tablet Commonly known as: SINGULAIR TAKE 1 TABLET(10 MG) BY MOUTH AT BEDTIME   ondansetron 8 MG tablet Commonly known as: Zofran Take 1 tablet (8 mg total) by mouth 2 (two) times daily as needed (Nausea or vomiting).   potassium chloride SA 20 MEQ tablet Commonly known as: KLOR-CON TAKE 1 TABLET(20 MEQ) BY MOUTH TWICE DAILY   PROBIOTIC DAILY PO Take by mouth daily.   triamcinolone 55 MCG/ACT Aero nasal inhaler Commonly known as: NASACORT Place 2 sprays into the nose as needed.   triamterene-hydrochlorothiazide 37.5-25 MG tablet Commonly known as: MAXZIDE-25 1 tablet by mouth at bedtime   Vitamin D3 50 MCG (2000 UT) Tabs Take 2 tablets by mouth daily.   Xiidra 5 % Soln Generic drug: Lifitegrast   Zinc 50 MG Tabs Take 50 mg by mouth daily.   zinc gluconate 50 MG tablet Take 50 mg by mouth daily.        Allergies:  Allergies  Allergen Reactions   Codeine Nausea Only    Past Medical History, Surgical history, Social history, and Family History were reviewed and updated.  Review of Systems: Review of Systems  Constitutional: Negative.   HENT: Negative.    Eyes:  Positive for blurred vision.  Respiratory: Negative.    Cardiovascular: Negative.   Gastrointestinal: Negative.   Genitourinary: Negative.   Musculoskeletal: Negative.   Skin: Negative.   Neurological: Negative.   Endo/Heme/Allergies: Negative.   Psychiatric/Behavioral: Negative.      Physical Exam:  weight  is 174 lb (78.9 kg). Her oral temperature is 98.9 F (37.2 C). Her blood pressure is 141/74 (abnormal) and her pulse is 77. Her respiration is 16 and oxygen saturation is 100%.   Wt Readings from Last 3 Encounters:  12/22/20 174 lb (78.9 kg)  12/02/20 175 lb 6.4 oz (79.6 kg)  12/01/20 175 lb (79.4 kg)    Physical Exam Vitals reviewed.  HENT:     Head: Normocephalic and atraumatic.  Eyes:     Pupils: Pupils are equal, round, and reactive to light.  Cardiovascular:     Rate and Rhythm: Normal rate  and regular rhythm.     Heart sounds: Normal heart sounds.  Pulmonary:     Effort: Pulmonary effort is normal.     Breath sounds: Normal breath sounds.  Abdominal:     General: Bowel sounds are normal.     Palpations: Abdomen is soft.  Musculoskeletal:        General: No tenderness or deformity. Normal range of motion.     Cervical back: Normal range of motion.  Lymphadenopathy:     Cervical: No cervical adenopathy.  Skin:    General: Skin is warm and dry.     Findings: No erythema or rash.  Neurological:     Mental Status: She is alert and oriented to person, place, and time.  Psychiatric:        Behavior: Behavior normal.        Thought Content: Thought content normal.        Judgment: Judgment normal.     Lab Results  Component Value Date   WBC 5.5 12/22/2020   HGB 10.4 (L) 12/22/2020   HCT 31.9 (L) 12/22/2020   MCV 78.6 (L) 12/22/2020   PLT 161 12/22/2020   Lab Results  Component Value Date   FERRITIN 8,819 (H) 12/22/2020   IRON 55 12/22/2020   TIBC 345 12/22/2020   UIBC 290 12/22/2020   IRONPCTSAT 16 (L) 12/22/2020   Lab Results  Component Value Date   RETICCTPCT 1.6 11/05/2019   RBC 4.06 12/22/2020   Lab Results  Component Value Date   KPAFRELGTCHN 1.7 (L) 12/22/2020   LAMBDASER 70.9 (H) 12/22/2020   KAPLAMBRATIO 0.02 (L) 12/22/2020   Lab Results  Component Value Date   IGGSERUM 277 (L) 12/01/2020   IGA 5 (L) 12/01/2020   IGMSERUM <5 (L) 12/01/2020    Lab Results  Component Value Date   TOTALPROTELP 5.9 (L) 12/22/2020   ALBUMINELP 3.3 12/22/2020   A1GS 0.3 12/22/2020   A2GS 1.0 12/22/2020   BETS 1.1 12/22/2020   BETA2SER 0.4 11/23/2014   GAMS 0.2 (L) 12/22/2020   MSPIKE Not Observed 12/22/2020   SPEI Comment 10/19/2020     Chemistry      Component Value Date/Time   NA 140 12/22/2020 0910   NA 141 01/10/2017 1115   NA 140 06/21/2016 0918   K 3.3 (L) 12/22/2020 0910   K 4.0 01/10/2017 1115   K 4.3 06/21/2016 0918   CL 102 12/22/2020 0910   CL 106 01/10/2017 1115   CO2 29 12/22/2020 0910   CO2 27 01/10/2017 1115   CO2 20 (L) 06/21/2016 0918   BUN 14 12/22/2020 0910   BUN 15 01/10/2017 1115   BUN 15.8 06/21/2016 0918   CREATININE 0.79 12/22/2020 0910   CREATININE 1.0 01/10/2017 1115   CREATININE 0.8 06/21/2016 0918      Component Value Date/Time   CALCIUM 10.0 12/22/2020 0910   CALCIUM 9.5 01/10/2017 1115   CALCIUM 9.4 06/21/2016 0918   ALKPHOS 61 12/22/2020 0910   ALKPHOS 40 01/10/2017 1115   ALKPHOS 66 06/21/2016 0918   AST 23 12/22/2020 0910   AST 17 06/21/2016 0918   ALT 15 12/22/2020 0910   ALT 19 01/10/2017 1115   ALT 37 06/21/2016 0918   BILITOT 0.3 12/22/2020 0910   BILITOT 0.32 06/21/2016 0918       Impression and Plan: Ms. Dinneen is a very pleasant 70 yo African American female with recurrent lambda light chain myeloma.  She had her second stem cell transplant for light  chain myeloma was in June 2016.   I forgot to mention that the FDA has taken Blenrep off the market.  As such, we really cannot use it even if it was working for her.  I think she is going to have cataract surgery.  I will see her prior with her having cataract surgery.  Again, we should be had a good response with the carfilzomib/Sarclisa protocol.  I think this would make sense.  Again she has not had the carfilzomib for 3 years.  I think it be reasonable to try to retreat her.  Hopefully we can get started next week.  Again  I am not sure when she is going to have the cataract surgery.  I need to try to schedule things around the cataract surgery.    Emily Macho, MD 11/28/20221:13 PM

## 2020-12-28 NOTE — Progress Notes (Signed)
Pharmacist Chemotherapy Monitoring - Initial Assessment    Anticipated start date: 01/04/21   The following has been reviewed per standard work regarding the patient's treatment regimen: The patient's diagnosis, treatment plan and drug doses, and organ/hematologic function Lab orders and baseline tests specific to treatment regimen  The treatment plan start date, drug sequencing, and pre-medications Prior authorization status  Patient's documented medication list, including drug-drug interaction screen and prescriptions for anti-emetics and supportive care specific to the treatment regimen The drug concentrations, fluid compatibility, administration routes, and timing of the medications to be used The patient's access for treatment and lifetime cumulative dose history, if applicable  The patient's medication allergies and previous infusion related reactions, if applicable   Changes made to treatment plan:  treatment plan date and pre-medications Aloxi added /date changed   Follow up needed:  N/A and Pending authorization for treatment    Ahri Olson, Jacqlyn Larsen, [facility], 12/28/2020  1:42 PM   Addendum:  Aloxi added to careplan per T.O. Dr. Marin Olp

## 2020-12-29 DIAGNOSIS — I10 Essential (primary) hypertension: Secondary | ICD-10-CM

## 2020-12-29 DIAGNOSIS — E6609 Other obesity due to excess calories: Secondary | ICD-10-CM | POA: Insufficient documentation

## 2020-12-29 DIAGNOSIS — N644 Mastodynia: Secondary | ICD-10-CM | POA: Insufficient documentation

## 2020-12-31 ENCOUNTER — Ambulatory Visit: Payer: Medicare Other

## 2020-12-31 ENCOUNTER — Encounter: Payer: Self-pay | Admitting: *Deleted

## 2020-12-31 ENCOUNTER — Ambulatory Visit
Admission: RE | Admit: 2020-12-31 | Discharge: 2020-12-31 | Disposition: A | Discharge status: Home / Self Care | Payer: Medicare Other | Source: Ambulatory Visit | Attending: Nurse Practitioner | Admitting: Nurse Practitioner

## 2020-12-31 DIAGNOSIS — R922 Inconclusive mammogram: Secondary | ICD-10-CM | POA: Diagnosis not present

## 2020-12-31 DIAGNOSIS — N644 Mastodynia: Secondary | ICD-10-CM | POA: Diagnosis not present

## 2020-12-31 NOTE — Progress Notes (Signed)
This nurse spoke to a customer service rep at [employer] regarding a P2P for Aloxi. Aloxi case # is [id number]. He stated,"I'm going to fax over the last office note from Dr. Marin Olp to see if that's enough to get a PA. We will call you next week if we need more information to make a decision. For now, a P2P is not needed.

## 2021-01-04 ENCOUNTER — Inpatient Hospital Stay: Payer: Medicare Other | Attending: Hematology & Oncology

## 2021-01-04 ENCOUNTER — Inpatient Hospital Stay: Payer: Medicare Other

## 2021-01-04 ENCOUNTER — Other Ambulatory Visit: Payer: Self-pay

## 2021-01-04 ENCOUNTER — Telehealth: Payer: Self-pay | Admitting: *Deleted

## 2021-01-04 VITALS — BP 152/76 | HR 74 | Temp 98.8°F | Resp 18

## 2021-01-04 DIAGNOSIS — C9000 Multiple myeloma not having achieved remission: Secondary | ICD-10-CM

## 2021-01-04 DIAGNOSIS — H538 Other visual disturbances: Secondary | ICD-10-CM | POA: Diagnosis not present

## 2021-01-04 DIAGNOSIS — D500 Iron deficiency anemia secondary to blood loss (chronic): Secondary | ICD-10-CM | POA: Diagnosis not present

## 2021-01-04 DIAGNOSIS — R58 Hemorrhage, not elsewhere classified: Secondary | ICD-10-CM | POA: Diagnosis not present

## 2021-01-04 DIAGNOSIS — Z79899 Other long term (current) drug therapy: Secondary | ICD-10-CM | POA: Insufficient documentation

## 2021-01-04 DIAGNOSIS — Z5112 Encounter for antineoplastic immunotherapy: Secondary | ICD-10-CM | POA: Insufficient documentation

## 2021-01-04 LAB — CMP (CANCER CENTER ONLY)
ALT: 17 U/L (ref 0–44)
AST: 21 U/L (ref 15–41)
Albumin: 4 g/dL (ref 3.5–5.0)
Alkaline Phosphatase: 65 U/L (ref 38–126)
Anion gap: 9 (ref 5–15)
BUN: 13 mg/dL (ref 8–23)
CO2: 29 mmol/L (ref 22–32)
Calcium: 9.6 mg/dL (ref 8.9–10.3)
Chloride: 102 mmol/L (ref 98–111)
Creatinine: 0.88 mg/dL (ref 0.44–1.00)
GFR, Estimated: >60 mL/min (ref >60)
Glucose, Bld: 105 mg/dL — ABNORMAL HIGH (ref 70–99)
Potassium: 3.2 mmol/L — ABNORMAL LOW (ref 3.5–5.1)
Sodium: 140 mmol/L (ref 135–145)
Total Bilirubin: 0.3 mg/dL (ref 0.3–1.2)
Total Protein: 6.1 g/dL — ABNORMAL LOW (ref 6.5–8.1)

## 2021-01-04 LAB — CBC WITH DIFFERENTIAL (CANCER CENTER ONLY)
Abs Immature Granulocytes: 0.03 10*3/uL (ref 0.00–0.07)
Basophils Absolute: 0.1 10*3/uL (ref 0.0–0.1)
Basophils Relative: 1 %
Eosinophils Absolute: 0.1 10*3/uL (ref 0.0–0.5)
Eosinophils Relative: 3 %
HCT: 32 % — ABNORMAL LOW (ref 36.0–46.0)
Hemoglobin: 10.3 g/dL — ABNORMAL LOW (ref 12.0–15.0)
Immature Granulocytes: 1 %
Lymphocytes Relative: 12 %
Lymphs Abs: 0.6 10*3/uL — ABNORMAL LOW (ref 0.7–4.0)
MCH: 25.6 pg — ABNORMAL LOW (ref 26.0–34.0)
MCHC: 32.2 g/dL (ref 30.0–36.0)
MCV: 79.6 fL — ABNORMAL LOW (ref 80.0–100.0)
Monocytes Absolute: 0.6 10*3/uL (ref 0.1–1.0)
Monocytes Relative: 12 %
Neutro Abs: 3.5 10*3/uL (ref 1.7–7.7)
Neutrophils Relative %: 71 %
Platelet Count: 151 10*3/uL (ref 150–400)
RBC: 4.02 MIL/uL (ref 3.87–5.11)
RDW: 18.7 % — ABNORMAL HIGH (ref 11.5–15.5)
WBC Count: 5 10*3/uL (ref 4.0–10.5)
nRBC: 0 % (ref 0.0–0.2)

## 2021-01-04 LAB — SAMPLE TO BLOOD BANK

## 2021-01-04 LAB — TYPE AND SCREEN
ABO/RH(D): A POS
Antibody Screen: NEGATIVE

## 2021-01-04 LAB — LACTATE DEHYDROGENASE: LDH: 342 U/L — ABNORMAL HIGH (ref 98–192)

## 2021-01-04 MED ORDER — SODIUM CHLORIDE 0.9% FLUSH
10.0000 mL | INTRAVENOUS | Status: DC | PRN
  Administered 2021-01-04: 10 mL

## 2021-01-04 MED ORDER — SODIUM CHLORIDE 0.9 % IV SOLN
10.0000 mg/kg | Freq: Once | INTRAVENOUS | Status: AC
  Administered 2021-01-04: 800 mg via INTRAVENOUS
  Filled 2021-01-04: qty 25

## 2021-01-04 MED ORDER — DEXTROSE 5 % IV SOLN
20.0000 mg/m2 | Freq: Once | INTRAVENOUS | Status: AC
  Administered 2021-01-04: 38 mg via INTRAVENOUS
  Filled 2021-01-04: qty 15

## 2021-01-04 MED ORDER — SODIUM CHLORIDE 0.9 % IV SOLN: Freq: Once | INTRAVENOUS | Status: AC

## 2021-01-04 MED ORDER — DIPHENHYDRAMINE HCL 50 MG/ML IJ SOLN
50.0000 mg | Freq: Once | INTRAMUSCULAR | Status: AC
  Administered 2021-01-04: 50 mg via INTRAVENOUS
  Filled 2021-01-04: qty 1

## 2021-01-04 MED ORDER — HEPARIN SOD (PORK) LOCK FLUSH 100 UNIT/ML IV SOLN
500.0000 [IU] | Freq: Once | INTRAVENOUS | Status: AC | PRN
  Administered 2021-01-04: 500 [IU]

## 2021-01-04 MED ORDER — SODIUM CHLORIDE 0.9 % IV SOLN
20.0000 mg | Freq: Once | INTRAVENOUS | Status: AC
  Administered 2021-01-04: 20 mg via INTRAVENOUS
  Filled 2021-01-04: qty 20

## 2021-01-04 MED ORDER — ACETAMINOPHEN 325 MG PO TABS
650.0000 mg | ORAL_TABLET | Freq: Once | ORAL | Status: AC
  Administered 2021-01-04: 650 mg via ORAL
  Filled 2021-01-04: qty 2

## 2021-01-04 MED ORDER — FAMOTIDINE 20 MG IN NS 100 ML IVPB
20.0000 mg | Freq: Once | INTRAVENOUS | Status: AC
  Administered 2021-01-04: 20 mg via INTRAVENOUS
  Filled 2021-01-04: qty 20

## 2021-01-04 MED ORDER — PALONOSETRON HCL INJECTION 0.25 MG/5ML
0.2500 mg | Freq: Once | INTRAVENOUS | Status: AC
  Administered 2021-01-04: 0.25 mg via INTRAVENOUS
  Filled 2021-01-04: qty 5

## 2021-01-04 NOTE — Patient Instructions (Addendum)
[facility] [place]  Discharge Instructions: Thank you for choosing [facility] to provide your oncology and hematology care.   If you have a lab appointment with the [facility], please go directly to the [facility] and check in at the registration area.  Wear comfortable clothing and clothing appropriate for easy access to any Portacath or PICC line.   We strive to give you quality time with your provider. You may need to reschedule your appointment if you arrive late (15 or more minutes).  Arriving late affects you and other patients whose appointments are after yours.  Also, if you miss three or more appointments without notifying the office, you may be dismissed from the clinic at the provider's discretion.      For prescription refill requests, have your pharmacy contact our office and allow 72 hours for refills to be completed.    Today you received the following chemotherapy and/or immunotherapy agents Sarclisa       To help prevent nausea and vomiting after your treatment, we encourage you to take your nausea medication as directed.  BELOW ARE SYMPTOMS THAT SHOULD BE REPORTED IMMEDIATELY: *FEVER GREATER THAN 100.4 F (38 C) OR HIGHER *CHILLS OR SWEATING *NAUSEA AND VOMITING THAT IS NOT CONTROLLED WITH YOUR NAUSEA MEDICATION *UNUSUAL SHORTNESS OF BREATH *UNUSUAL BRUISING OR BLEEDING *URINARY PROBLEMS (pain or burning when urinating, or frequent urination) *BOWEL PROBLEMS (unusual diarrhea, constipation, pain near the anus) TENDERNESS IN MOUTH AND THROAT WITH OR WITHOUT PRESENCE OF ULCERS (sore throat, sores in mouth, or a toothache) UNUSUAL RASH, SWELLING OR PAIN  UNUSUAL VAGINAL DISCHARGE OR ITCHING   Items with * indicate a potential emergency and should be followed up as soon as possible or go to the Emergency Department if any problems should occur.  Please show the CHEMOTHERAPY ALERT CARD or IMMUNOTHERAPY ALERT CARD at check-in to the  Emergency Department and triage nurse. Should you have questions after your visit or need to cancel or reschedule your appointment, please contact [facility]  [phone] and follow the prompts.  Office hours are 8:00 a.m. to 4:30 p.m. Monday - Friday. Please note that voicemails left after 4:00 p.m. may not be returned until the following business day.  We are closed weekends and major holidays. You have access to a nurse at all times for urgent questions. Please call the main number to the clinic [phone] and follow the prompts.  For any non-urgent questions, you may also contact your provider using MyChart. We now offer e-Visits for anyone 11 and older to request care online for non-urgent symptoms. For details visit [email or web address].   Also download the MyChart app! Go to the app store, search "MyChart", open the app, select [facility], and log in with your MyChart username and password.  Due to Covid, a mask is required upon entering the hospital/clinic. If you do not have a mask, one will be given to you upon arrival. For doctor visits, patients may have 1 support person aged 42 or older with them. For treatment visits, patients cannot have anyone with them due to current Covid guidelines and our immunocompromised population. [name] [place]  Discharge Instructions: Thank you for choosing [facility] to provide your oncology and hematology care.   If you have a lab appointment with the [facility], please go directly to the [facility] and check in at the registration area.  Wear comfortable  clothing and clothing appropriate for easy access to any Portacath or PICC line.   We strive to give you quality time with your provider. You may need to reschedule your appointment if you arrive late (15 or more minutes).  Arriving late affects you and other patients whose appointments are after yours.  Also, if you miss three  or more appointments without notifying the office, you may be dismissed from the clinic at the provider's discretion.      For prescription refill requests, have your pharmacy contact our office and allow 72 hours for refills to be completed.    Today you received the following chemotherapy and/or immunotherapy agents kyprolis,Sarclisa   To help prevent nausea and vomiting after your treatment, we encourage you to take your nausea medication as directed.  BELOW ARE SYMPTOMS THAT SHOULD BE REPORTED IMMEDIATELY: *FEVER GREATER THAN 100.4 F (38 C) OR HIGHER *CHILLS OR SWEATING *NAUSEA AND VOMITING THAT IS NOT CONTROLLED WITH YOUR NAUSEA MEDICATION *UNUSUAL SHORTNESS OF BREATH *UNUSUAL BRUISING OR BLEEDING *URINARY PROBLEMS (pain or burning when urinating, or frequent urination) *BOWEL PROBLEMS (unusual diarrhea, constipation, pain near the anus) TENDERNESS IN MOUTH AND THROAT WITH OR WITHOUT PRESENCE OF ULCERS (sore throat, sores in mouth, or a toothache) UNUSUAL RASH, SWELLING OR PAIN  UNUSUAL VAGINAL DISCHARGE OR ITCHING   Items with * indicate a potential emergency and should be followed up as soon as possible or go to the Emergency Department if any problems should occur.  Please show the CHEMOTHERAPY ALERT CARD or IMMUNOTHERAPY ALERT CARD at check-in to the Emergency Department and triage nurse. Should you have questions after your visit or need to cancel or reschedule your appointment, please contact [facility]  [phone] and follow the prompts.  Office hours are 8:00 a.m. to 4:30 p.m. Monday - Friday. Please note that voicemails left after 4:00 p.m. may not be returned until the following business day.  We are closed weekends and major holidays. You have access to a nurse at all times for urgent questions. Please call the main number to the clinic [phone] and follow the prompts.  For any non-urgent questions, you may also contact your provider using  MyChart. We now offer e-Visits for anyone 72 and older to request care online for non-urgent symptoms. For details visit [email or web address].   Also download the MyChart app! Go to the app store, search "MyChart", open the app, select [facility], and log in with your MyChart username and password.  Due to Covid, a mask is required upon entering the hospital/clinic. If you do not have a mask, one will be given to you upon arrival. For doctor visits, patients may have 1 support person aged 47 or older with them. For treatment visits, patients cannot have anyone with them due to current Covid guidelines and our immunocompromised population.

## 2021-01-04 NOTE — Patient Instructions (Signed)

## 2021-01-05 LAB — KAPPA/LAMBDA LIGHT CHAINS
Kappa free light chain: 1.4 mg/L — ABNORMAL LOW (ref 3.3–19.4)
Kappa, lambda light chain ratio: 0.02 — ABNORMAL LOW (ref 0.26–1.65)
Lambda free light chains: 78.7 mg/L — ABNORMAL HIGH (ref 5.7–26.3)

## 2021-01-05 LAB — IGG, IGA, IGM
IgA: <5 mg/dL — ABNORMAL LOW (ref 87–352)
IgG (Immunoglobin G), Serum: 236 mg/dL — ABNORMAL LOW (ref 586–1602)
IgM (Immunoglobulin M), Srm: <5 mg/dL — ABNORMAL LOW (ref 26–217)

## 2021-01-10 DIAGNOSIS — H25813 Combined forms of age-related cataract, bilateral: Secondary | ICD-10-CM | POA: Diagnosis not present

## 2021-01-10 DIAGNOSIS — Z79899 Other long term (current) drug therapy: Secondary | ICD-10-CM | POA: Diagnosis not present

## 2021-01-10 DIAGNOSIS — H16223 Keratoconjunctivitis sicca, not specified as Sjogren's, bilateral: Secondary | ICD-10-CM | POA: Diagnosis not present

## 2021-01-10 DIAGNOSIS — H40033 Anatomical narrow angle, bilateral: Secondary | ICD-10-CM | POA: Diagnosis not present

## 2021-01-11 ENCOUNTER — Other Ambulatory Visit: Payer: Self-pay

## 2021-01-11 DIAGNOSIS — C9000 Multiple myeloma not having achieved remission: Secondary | ICD-10-CM

## 2021-01-12 ENCOUNTER — Encounter: Payer: Self-pay | Admitting: Hematology & Oncology

## 2021-01-12 ENCOUNTER — Inpatient Hospital Stay: Payer: Medicare Other

## 2021-01-12 ENCOUNTER — Inpatient Hospital Stay (HOSPITAL_BASED_OUTPATIENT_CLINIC_OR_DEPARTMENT_OTHER): Payer: Medicare Other | Admitting: Hematology & Oncology

## 2021-01-12 ENCOUNTER — Other Ambulatory Visit: Payer: Self-pay

## 2021-01-12 VITALS — BP 150/87 | HR 76 | Temp 99.1°F | Resp 18 | Wt 175.0 lb

## 2021-01-12 VITALS — Temp 98.7°F

## 2021-01-12 DIAGNOSIS — Z79899 Other long term (current) drug therapy: Secondary | ICD-10-CM | POA: Diagnosis not present

## 2021-01-12 DIAGNOSIS — C9000 Multiple myeloma not having achieved remission: Secondary | ICD-10-CM

## 2021-01-12 DIAGNOSIS — Z5112 Encounter for antineoplastic immunotherapy: Secondary | ICD-10-CM | POA: Diagnosis not present

## 2021-01-12 DIAGNOSIS — H538 Other visual disturbances: Secondary | ICD-10-CM | POA: Diagnosis not present

## 2021-01-12 DIAGNOSIS — D500 Iron deficiency anemia secondary to blood loss (chronic): Secondary | ICD-10-CM | POA: Diagnosis not present

## 2021-01-12 DIAGNOSIS — R58 Hemorrhage, not elsewhere classified: Secondary | ICD-10-CM | POA: Diagnosis not present

## 2021-01-12 LAB — CBC WITH DIFFERENTIAL (CANCER CENTER ONLY)
Abs Immature Granulocytes: 0.02 10*3/uL (ref 0.00–0.07)
Basophils Absolute: 0.1 10*3/uL (ref 0.0–0.1)
Basophils Relative: 1 %
Eosinophils Absolute: 0.1 10*3/uL (ref 0.0–0.5)
Eosinophils Relative: 2 %
HCT: 31.1 % — ABNORMAL LOW (ref 36.0–46.0)
Hemoglobin: 9.9 g/dL — ABNORMAL LOW (ref 12.0–15.0)
Immature Granulocytes: 0 %
Lymphocytes Relative: 11 %
Lymphs Abs: 0.5 10*3/uL — ABNORMAL LOW (ref 0.7–4.0)
MCH: 25.4 pg — ABNORMAL LOW (ref 26.0–34.0)
MCHC: 31.8 g/dL (ref 30.0–36.0)
MCV: 79.7 fL — ABNORMAL LOW (ref 80.0–100.0)
Monocytes Absolute: 0.6 10*3/uL (ref 0.1–1.0)
Monocytes Relative: 12 %
Neutro Abs: 3.6 10*3/uL (ref 1.7–7.7)
Neutrophils Relative %: 74 %
Platelet Count: 126 10*3/uL — ABNORMAL LOW (ref 150–400)
RBC: 3.9 MIL/uL (ref 3.87–5.11)
RDW: 18.8 % — ABNORMAL HIGH (ref 11.5–15.5)
WBC Count: 4.9 10*3/uL (ref 4.0–10.5)
nRBC: 0 % (ref 0.0–0.2)

## 2021-01-12 LAB — CMP (CANCER CENTER ONLY)
ALT: 17 U/L (ref 0–44)
AST: 19 U/L (ref 15–41)
Albumin: 3.9 g/dL (ref 3.5–5.0)
Alkaline Phosphatase: 59 U/L (ref 38–126)
Anion gap: 8 (ref 5–15)
BUN: 16 mg/dL (ref 8–23)
CO2: 29 mmol/L (ref 22–32)
Calcium: 9.6 mg/dL (ref 8.9–10.3)
Chloride: 103 mmol/L (ref 98–111)
Creatinine: 0.85 mg/dL (ref 0.44–1.00)
GFR, Estimated: >60 mL/min (ref >60)
Glucose, Bld: 96 mg/dL (ref 70–99)
Potassium: 3.6 mmol/L (ref 3.5–5.1)
Sodium: 140 mmol/L (ref 135–145)
Total Bilirubin: 0.3 mg/dL (ref 0.3–1.2)
Total Protein: 5.9 g/dL — ABNORMAL LOW (ref 6.5–8.1)

## 2021-01-12 LAB — LACTATE DEHYDROGENASE: LDH: 269 U/L — ABNORMAL HIGH (ref 98–192)

## 2021-01-12 MED ORDER — HEPARIN SOD (PORK) LOCK FLUSH 100 UNIT/ML IV SOLN
500.0000 [IU] | Freq: Once | INTRAVENOUS | Status: AC | PRN
  Administered 2021-01-12: 13:00:00 500 [IU]

## 2021-01-12 MED ORDER — DIPHENHYDRAMINE HCL 50 MG/ML IJ SOLN
50.0000 mg | Freq: Once | INTRAMUSCULAR | Status: AC
  Administered 2021-01-12: 09:00:00 50 mg via INTRAVENOUS
  Filled 2021-01-12: qty 1

## 2021-01-12 MED ORDER — SODIUM CHLORIDE 0.9 % IV SOLN
10.0000 mg/kg | Freq: Once | INTRAVENOUS | Status: AC
  Administered 2021-01-12: 11:00:00 800 mg via INTRAVENOUS
  Filled 2021-01-12: qty 25

## 2021-01-12 MED ORDER — DEXTROSE 5 % IV SOLN
56.0000 mg/m2 | Freq: Once | INTRAVENOUS | Status: AC
  Administered 2021-01-12: 13:00:00 110 mg via INTRAVENOUS
  Filled 2021-01-12: qty 30

## 2021-01-12 MED ORDER — SODIUM CHLORIDE 0.9 % IV SOLN
20.0000 mg | Freq: Once | INTRAVENOUS | Status: AC
  Administered 2021-01-12: 09:00:00 20 mg via INTRAVENOUS
  Filled 2021-01-12: qty 20

## 2021-01-12 MED ORDER — FAMOTIDINE 20 MG IN NS 100 ML IVPB
20.0000 mg | Freq: Two times a day (BID) | INTRAVENOUS | Status: DC
  Administered 2021-01-12: 20 mg via INTRAVENOUS
  Filled 2021-01-12: qty 20

## 2021-01-12 MED ORDER — SODIUM CHLORIDE 0.9 % IV SOLN: Freq: Once | INTRAVENOUS | Status: AC

## 2021-01-12 MED ORDER — PALONOSETRON HCL INJECTION 0.25 MG/5ML
0.2500 mg | Freq: Once | INTRAVENOUS | Status: AC
  Administered 2021-01-12: 10:00:00 0.25 mg via INTRAVENOUS
  Filled 2021-01-12: qty 5

## 2021-01-12 MED ORDER — ACETAMINOPHEN 325 MG PO TABS
650.0000 mg | ORAL_TABLET | Freq: Once | ORAL | Status: AC
  Administered 2021-01-12: 09:00:00 650 mg via ORAL
  Filled 2021-01-12: qty 2

## 2021-01-12 MED ORDER — SODIUM CHLORIDE 0.9% FLUSH
10.0000 mL | INTRAVENOUS | Status: DC | PRN
  Administered 2021-01-12: 13:00:00 10 mL

## 2021-01-12 NOTE — Progress Notes (Signed)
Hematology and Oncology Follow Up Visit  Emily Greer [id number] 1950-05-02 70 y.o. 01/12/2021   Principle Diagnosis:  Recurrent lambda light chain myeloma - nl cytogenetics History of recurrent endometrial carcinoma Iron deficiency anemia -blood loss   Past Therapy:             Status post second autologous stem cell transplant on 07/24/2014 Maintenance therapy with Pomalidomide/every 2 week Velcade - d/c'ed Radiation therapy for endometrial recurrence - completed 04/20/2015 Pomalyst/Kyprolis 70mg /m2 IV q 2 weeks - s/p cycle #12 - held on 12/26/2017 for hematuria Daratumumab/Pomalyst (1 mg) - status post cycle 19 -- d/c on 08/21/2019 Melflufen 40 mg IV q 4 weeks -- started on 08/27/2019, s/p cycle #2 --  D/c due to FDA removal Selinexor 60 mg po q week -- start on 01/19/2020 -- changed on 03/18/2020 -- d/c on 04/27/2020   Current Therapy:        Blenrep 2.5 mg/m2 IV q 3 weeks -- started on 05/20/2020, s/p cycle #7 --D/C secondary to progression on 12/22/2020 Carfilzomib/Sarclisa -- start cycle #1 on 01/03/2021 Femara 2.5 mg po q day     Interim History:  Emily Greer is here today for follow-up.  So far, everything is going pretty well.  She is now on the carfilzomib/Sarclisa protocol.  She is tolerating this well.  She unfortunately, has not had eye surgery for the cataracts.  Apparently, her eyes are too dry for the surgery.  She needs to see another eye specialist.  She will not see them until February.  As such, I do not think that she will have any cataract surgery until the spring.  She has had no problems with nausea or vomiting.  There is been no change in bowel or bladder habits.  She has had no fever.  There has been no bleeding.  Her lambda light chain before we started her new protocol was 7.9 mg/dL.  She had a wonderful time for Thanksgiving.  She was updated [place].  A grandson had his seventh  birthday.  Currently, her performance status is ECOG 1.     Medications:  Allergies as of 01/12/2021       Reactions   Codeine Nausea Only        Medication List        Accurate as of January 12, 2021  8:58 AM. If you have any questions, ask your nurse or doctor.          acyclovir 400 MG tablet Commonly known as: ZOVIRAX TAKE 1 TABLET(400 MG) BY MOUTH TWICE DAILY   albuterol 108 (90 Base) MCG/ACT inhaler Commonly known as: VENTOLIN HFA Inhale 2 puffs into the lungs every 6 (six) hours as needed for wheezing or shortness of breath. 2 puffs 3 times daily x 5 days then every 6 hours as needed.   amLODipine 10 MG tablet Commonly known as: NORVASC TAKE 1 TABLET(10 MG) BY MOUTH EVERY MORNING   aspirin EC 81 MG tablet Take 81 mg by mouth daily.   CALCIUM 1200+D3 PO Take 1 tablet by mouth daily.   cetirizine 10 MG tablet Commonly known as: ZYRTEC Take 1 tablet (10 mg total) by mouth at bedtime.   chlorhexidine 0.12 % solution Commonly known as: PERIDEX SMARTSIG:By Mouth   Fluocinolone Acetonide Scalp 0.01 % Oil Apply topically.   guaiFENesin-dextromethorphan 100-10 MG/5ML syrup Commonly known as: ROBITUSSIN DM Take 5 mLs by mouth every 4 (four) hours as needed for cough.   hydrochlorothiazide 12.5 MG capsule Commonly  known as: MICROZIDE Take 12.5 mg by mouth every morning.   letrozole 2.5 MG tablet Commonly known as: FEMARA Take 1 tablet (2.5 mg total) by mouth daily.   lidocaine-prilocaine cream Commonly known as: EMLA Apply 1 application topically as needed.   loratadine 10 MG tablet Commonly known as: CLARITIN Take 10 mg by mouth every morning. Reported on 08/19/2015   metoprolol succinate 50 MG 24 hr tablet Commonly known as: TOPROL-XL TAKE 1 TABLET(50 MG) BY MOUTH EVERY MORNING   montelukast 10 MG tablet Commonly known as: SINGULAIR TAKE 1 TABLET(10 MG) BY MOUTH AT BEDTIME   ondansetron 8 MG tablet Commonly known as: Zofran Take 1 tablet (8 mg total) by mouth 2 (two) times daily as needed (Nausea  or vomiting).   potassium chloride SA 20 MEQ tablet Commonly known as: KLOR-CON M TAKE 1 TABLET(20 MEQ) BY MOUTH TWICE DAILY   PROBIOTIC DAILY PO Take by mouth daily.   triamcinolone 55 MCG/ACT Aero nasal inhaler Commonly known as: NASACORT Place 2 sprays into the nose as needed.   triamterene-hydrochlorothiazide 37.5-25 MG tablet Commonly known as: MAXZIDE-25 1 tablet by mouth at bedtime   Vitamin D3 50 MCG (2000 UT) Tabs Take 2 tablets by mouth daily.   Xiidra 5 % Soln Generic drug: Lifitegrast   Zinc 50 MG Tabs Take 50 mg by mouth daily.   zinc gluconate 50 MG tablet Take 50 mg by mouth daily.        Allergies:  Allergies  Allergen Reactions   Codeine Nausea Only    Past Medical History, Surgical history, Social history, and Family History were reviewed and updated.  Review of Systems: Review of Systems  Constitutional: Negative.   HENT: Negative.    Eyes:  Positive for blurred vision.  Respiratory: Negative.    Cardiovascular: Negative.   Gastrointestinal: Negative.   Genitourinary: Negative.   Musculoskeletal: Negative.   Skin: Negative.   Neurological: Negative.   Endo/Heme/Allergies: Negative.   Psychiatric/Behavioral: Negative.      Physical Exam:  weight is 175 lb (79.4 kg). Her oral temperature is 99.1 F (37.3 C). Her blood pressure is 150/87 (abnormal) and her pulse is 76. Her respiration is 18 and oxygen saturation is 100%.   Wt Readings from Last 3 Encounters:  01/12/21 175 lb (79.4 kg)  12/22/20 174 lb (78.9 kg)  12/02/20 175 lb 6.4 oz (79.6 kg)    Physical Exam Vitals reviewed.  HENT:     Head: Normocephalic and atraumatic.  Eyes:     Pupils: Pupils are equal, round, and reactive to light.  Cardiovascular:     Rate and Rhythm: Normal rate and regular rhythm.     Heart sounds: Normal heart sounds.  Pulmonary:     Effort: Pulmonary effort is normal.     Breath sounds: Normal breath sounds.  Abdominal:     General: Bowel  sounds are normal.     Palpations: Abdomen is soft.  Musculoskeletal:        General: No tenderness or deformity. Normal range of motion.     Cervical back: Normal range of motion.  Lymphadenopathy:     Cervical: No cervical adenopathy.  Skin:    General: Skin is warm and dry.     Findings: No erythema or rash.  Neurological:     Mental Status: She is alert and oriented to person, place, and time.  Psychiatric:        Behavior: Behavior normal.        Thought  Content: Thought content normal.        Judgment: Judgment normal.     Lab Results  Component Value Date   WBC 4.9 01/12/2021   HGB 9.9 (L) 01/12/2021   HCT 31.1 (L) 01/12/2021   MCV 79.7 (L) 01/12/2021   PLT 126 (L) 01/12/2021   Lab Results  Component Value Date   FERRITIN 8,819 (H) 12/22/2020   IRON 55 12/22/2020   TIBC 345 12/22/2020   UIBC 290 12/22/2020   IRONPCTSAT 16 (L) 12/22/2020   Lab Results  Component Value Date   RETICCTPCT 1.6 11/05/2019   RBC 3.90 01/12/2021   Lab Results  Component Value Date   KPAFRELGTCHN 1.4 (L) 01/04/2021   LAMBDASER 78.7 (H) 01/04/2021   KAPLAMBRATIO 0.02 (L) 01/04/2021   Lab Results  Component Value Date   IGGSERUM 236 (L) 01/04/2021   IGA <5 (L) 01/04/2021   IGMSERUM <5 (L) 01/04/2021   Lab Results  Component Value Date   TOTALPROTELP 5.9 (L) 12/22/2020   ALBUMINELP 3.3 12/22/2020   A1GS 0.3 12/22/2020   A2GS 1.0 12/22/2020   BETS 1.1 12/22/2020   BETA2SER 0.4 11/23/2014   GAMS 0.2 (L) 12/22/2020   MSPIKE Not Observed 12/22/2020   SPEI Comment 10/19/2020     Chemistry      Component Value Date/Time   NA 140 01/12/2021 0825   NA 141 01/10/2017 1115   NA 140 06/21/2016 0918   K 3.6 01/12/2021 0825   K 4.0 01/10/2017 1115   K 4.3 06/21/2016 0918   CL 103 01/12/2021 0825   CL 106 01/10/2017 1115   CO2 29 01/12/2021 0825   CO2 27 01/10/2017 1115   CO2 20 (L) 06/21/2016 0918   BUN 16 01/12/2021 0825   BUN 15 01/10/2017 1115   BUN 15.8 06/21/2016  0918   CREATININE 0.85 01/12/2021 0825   CREATININE 1.0 01/10/2017 1115   CREATININE 0.8 06/21/2016 0918      Component Value Date/Time   CALCIUM 9.6 01/12/2021 0825   CALCIUM 9.5 01/10/2017 1115   CALCIUM 9.4 06/21/2016 0918   ALKPHOS 59 01/12/2021 0825   ALKPHOS 40 01/10/2017 1115   ALKPHOS 66 06/21/2016 0918   AST 19 01/12/2021 0825   AST 17 06/21/2016 0918   ALT 17 01/12/2021 0825   ALT 19 01/10/2017 1115   ALT 37 06/21/2016 0918   BILITOT 0.3 01/12/2021 0825   BILITOT 0.32 06/21/2016 0918       Impression and Plan: Ms. Hertzberg is a very pleasant 70 yo African American female with recurrent lambda light chain myeloma.  She had her second stem cell transplant for light chain myeloma was in June 2016.   We now have her on the Sarclisa/carfilzomib protocol.  At some point, our goal is to get her to teclistamab.  However, Dr. Barbaraann Boys at [facility] wants her on the [facility] protocol for as long as possible before we move over to the teclistamab.  We will go ahead with her eighth day of cycle 1 of treatment today.  I know she will have a wonderful Christmas.   Josph Macho, MD 12/14/20228:58 AM

## 2021-01-12 NOTE — Patient Instructions (Signed)
[facility] [place]  Discharge Instructions: Thank you for choosing [facility] to provide your oncology and hematology care.   If you have a lab appointment with the [facility], please go directly to the [facility] and check in at the registration area.  Wear comfortable clothing and clothing appropriate for easy access to any Portacath or PICC line.   We strive to give you quality time with your provider. You may need to reschedule your appointment if you arrive late (15 or more minutes).  Arriving late affects you and other patients whose appointments are after yours.  Also, if you miss three or more appointments without notifying the office, you may be dismissed from the clinic at the providers discretion.      For prescription refill requests, have your pharmacy contact our office and allow 72 hours for refills to be completed.    Today you received the following chemotherapy and/or immunotherapy agents sarclisa, kyprolis   To help prevent nausea and vomiting after your treatment, we encourage you to take your nausea medication as directed.  BELOW ARE SYMPTOMS THAT SHOULD BE REPORTED IMMEDIATELY: *FEVER GREATER THAN 100.4 F (38 C) OR HIGHER *CHILLS OR SWEATING *NAUSEA AND VOMITING THAT IS NOT CONTROLLED WITH YOUR NAUSEA MEDICATION *UNUSUAL SHORTNESS OF BREATH *UNUSUAL BRUISING OR BLEEDING *URINARY PROBLEMS (pain or burning when urinating, or frequent urination) *BOWEL PROBLEMS (unusual diarrhea, constipation, pain near the anus) TENDERNESS IN MOUTH AND THROAT WITH OR WITHOUT PRESENCE OF ULCERS (sore throat, sores in mouth, or a toothache) UNUSUAL RASH, SWELLING OR PAIN  UNUSUAL VAGINAL DISCHARGE OR ITCHING   Items with * indicate a potential emergency and should be followed up as soon as possible or go to the Emergency Department if any problems should occur.  Please show the CHEMOTHERAPY ALERT CARD or IMMUNOTHERAPY ALERT CARD at check-in to  the Emergency Department and triage nurse. Should you have questions after your visit or need to cancel or reschedule your appointment, please contact [facility]  [phone] and follow the prompts.  Office hours are 8:00 a.m. to 4:30 p.m. Monday - Friday. Please note that voicemails left after 4:00 p.m. may not be returned until the following business day.  We are closed weekends and major holidays. You have access to a nurse at all times for urgent questions. Please call the main number to the clinic [phone] and follow the prompts.  For any non-urgent questions, you may also contact your provider using MyChart. We now offer e-Visits for anyone 67 and older to request care online for non-urgent symptoms. For details visit [email or web address].   Also download the MyChart app! Go to the app store, search "MyChart", open the app, select [facility], and log in with your MyChart username and password.  Due to Covid, a mask is required upon entering the hospital/clinic. If you do not have a mask, one will be given to you upon arrival. For doctor visits, patients may have 1 support person aged 76 or older with them. For treatment visits, patients cannot have anyone with them due to current Covid guidelines and our immunocompromised population.

## 2021-01-12 NOTE — Patient Instructions (Signed)

## 2021-01-14 LAB — KAPPA/LAMBDA LIGHT CHAINS
Kappa free light chain: 1.3 mg/L — ABNORMAL LOW (ref 3.3–19.4)
Kappa, lambda light chain ratio: 0.02 — ABNORMAL LOW (ref 0.26–1.65)
Lambda free light chains: 79 mg/L — ABNORMAL HIGH (ref 5.7–26.3)

## 2021-01-14 LAB — IGG: IgG (Immunoglobin G), Serum: 231 mg/dL — ABNORMAL LOW (ref 586–1602)

## 2021-01-14 LAB — IGA: IgA: <5 mg/dL — ABNORMAL LOW (ref 87–352)

## 2021-01-14 LAB — IGM: IgM (Immunoglobulin M), Srm: <5 mg/dL — ABNORMAL LOW (ref 26–217)

## 2021-01-17 ENCOUNTER — Other Ambulatory Visit: Payer: Self-pay | Admitting: *Deleted

## 2021-01-17 ENCOUNTER — Encounter: Payer: Self-pay | Admitting: *Deleted

## 2021-01-17 DIAGNOSIS — C9000 Multiple myeloma not having achieved remission: Secondary | ICD-10-CM

## 2021-01-18 ENCOUNTER — Other Ambulatory Visit: Payer: Self-pay

## 2021-01-18 ENCOUNTER — Inpatient Hospital Stay: Payer: Medicare Other

## 2021-01-18 VITALS — BP 142/77 | HR 70 | Temp 98.2°F | Resp 18

## 2021-01-18 DIAGNOSIS — D500 Iron deficiency anemia secondary to blood loss (chronic): Secondary | ICD-10-CM | POA: Diagnosis not present

## 2021-01-18 DIAGNOSIS — R58 Hemorrhage, not elsewhere classified: Secondary | ICD-10-CM | POA: Diagnosis not present

## 2021-01-18 DIAGNOSIS — C9000 Multiple myeloma not having achieved remission: Secondary | ICD-10-CM | POA: Diagnosis not present

## 2021-01-18 DIAGNOSIS — H538 Other visual disturbances: Secondary | ICD-10-CM | POA: Diagnosis not present

## 2021-01-18 DIAGNOSIS — Z5112 Encounter for antineoplastic immunotherapy: Secondary | ICD-10-CM | POA: Diagnosis not present

## 2021-01-18 DIAGNOSIS — Z79899 Other long term (current) drug therapy: Secondary | ICD-10-CM | POA: Diagnosis not present

## 2021-01-18 LAB — CBC WITH DIFFERENTIAL (CANCER CENTER ONLY)
Abs Immature Granulocytes: 0.03 10*3/uL (ref 0.00–0.07)
Basophils Absolute: 0 10*3/uL (ref 0.0–0.1)
Basophils Relative: 0 %
Eosinophils Absolute: 0.1 10*3/uL (ref 0.0–0.5)
Eosinophils Relative: 2 %
HCT: 30.5 % — ABNORMAL LOW (ref 36.0–46.0)
Hemoglobin: 9.8 g/dL — ABNORMAL LOW (ref 12.0–15.0)
Immature Granulocytes: 1 %
Lymphocytes Relative: 8 %
Lymphs Abs: 0.4 10*3/uL — ABNORMAL LOW (ref 0.7–4.0)
MCH: 25.6 pg — ABNORMAL LOW (ref 26.0–34.0)
MCHC: 32.1 g/dL (ref 30.0–36.0)
MCV: 79.6 fL — ABNORMAL LOW (ref 80.0–100.0)
Monocytes Absolute: 0.7 10*3/uL (ref 0.1–1.0)
Monocytes Relative: 16 %
Neutro Abs: 3.3 10*3/uL (ref 1.7–7.7)
Neutrophils Relative %: 73 %
Platelet Count: 57 10*3/uL — ABNORMAL LOW (ref 150–400)
RBC: 3.83 MIL/uL — ABNORMAL LOW (ref 3.87–5.11)
RDW: 18.8 % — ABNORMAL HIGH (ref 11.5–15.5)
WBC Count: 4.5 10*3/uL (ref 4.0–10.5)
nRBC: 0.4 % — ABNORMAL HIGH (ref 0.0–0.2)

## 2021-01-18 LAB — COMPREHENSIVE METABOLIC PANEL
ALT: 19 U/L (ref 0–44)
AST: 20 U/L (ref 15–41)
Albumin: 3.7 g/dL (ref 3.5–5.0)
Alkaline Phosphatase: 55 U/L (ref 38–126)
Anion gap: 8 (ref 5–15)
BUN: 13 mg/dL (ref 8–23)
CO2: 27 mmol/L (ref 22–32)
Calcium: 9 mg/dL (ref 8.9–10.3)
Chloride: 104 mmol/L (ref 98–111)
Creatinine, Ser: 0.83 mg/dL (ref 0.44–1.00)
GFR, Estimated: >60 mL/min (ref >60)
Glucose, Bld: 91 mg/dL (ref 70–99)
Potassium: 3.4 mmol/L — ABNORMAL LOW (ref 3.5–5.1)
Sodium: 139 mmol/L (ref 135–145)
Total Bilirubin: 0.4 mg/dL (ref 0.3–1.2)
Total Protein: 5.9 g/dL — ABNORMAL LOW (ref 6.5–8.1)

## 2021-01-18 LAB — LACTATE DEHYDROGENASE: LDH: 254 U/L — ABNORMAL HIGH (ref 98–192)

## 2021-01-18 MED ORDER — FAMOTIDINE 20 MG IN NS 100 ML IVPB
20.0000 mg | Freq: Two times a day (BID) | INTRAVENOUS | Status: DC
  Administered 2021-01-18: 20 mg via INTRAVENOUS
  Filled 2021-01-18: qty 20

## 2021-01-18 MED ORDER — ACETAMINOPHEN 325 MG PO TABS
650.0000 mg | ORAL_TABLET | Freq: Once | ORAL | Status: AC
  Administered 2021-01-18: 10:00:00 650 mg via ORAL
  Filled 2021-01-18: qty 2

## 2021-01-18 MED ORDER — SODIUM CHLORIDE 0.9 % IV SOLN
10.0000 mg/kg | Freq: Once | INTRAVENOUS | Status: AC
  Administered 2021-01-18: 12:00:00 800 mg via INTRAVENOUS
  Filled 2021-01-18: qty 25

## 2021-01-18 MED ORDER — SODIUM CHLORIDE 0.9 % IV SOLN
20.0000 mg | Freq: Once | INTRAVENOUS | Status: AC
  Administered 2021-01-18: 11:00:00 20 mg via INTRAVENOUS
  Filled 2021-01-18: qty 20

## 2021-01-18 MED ORDER — HEPARIN SOD (PORK) LOCK FLUSH 100 UNIT/ML IV SOLN
500.0000 [IU] | Freq: Once | INTRAVENOUS | Status: AC | PRN
  Administered 2021-01-18: 14:00:00 500 [IU]

## 2021-01-18 MED ORDER — DIPHENHYDRAMINE HCL 50 MG/ML IJ SOLN
50.0000 mg | Freq: Once | INTRAMUSCULAR | Status: AC
  Administered 2021-01-18: 11:00:00 50 mg via INTRAVENOUS
  Filled 2021-01-18: qty 1

## 2021-01-18 MED ORDER — PALONOSETRON HCL INJECTION 0.25 MG/5ML
0.2500 mg | Freq: Once | INTRAVENOUS | Status: AC
  Administered 2021-01-18: 10:00:00 0.25 mg via INTRAVENOUS
  Filled 2021-01-18: qty 5

## 2021-01-18 MED ORDER — SODIUM CHLORIDE 0.9% FLUSH
10.0000 mL | INTRAVENOUS | Status: DC | PRN
  Administered 2021-01-18: 14:00:00 10 mL

## 2021-01-18 MED ORDER — SODIUM CHLORIDE 0.9 % IV SOLN
Freq: Once | INTRAVENOUS | Status: AC
Start: 2021-01-18 — End: 2021-01-18

## 2021-01-18 NOTE — Patient Instructions (Signed)

## 2021-01-18 NOTE — Progress Notes (Signed)
Platelets 57,[id number].  Dr Marin Olp notified.  All other labs reviewed with Dr Marin Olp.  Hold Kyprolis.  Ok to give [place] today

## 2021-01-18 NOTE — Patient Instructions (Signed)
[facility] [place]  Discharge Instructions: Thank you for choosing [facility] to provide your oncology and hematology care.   If you have a lab appointment with the [facility], please go directly to the [facility] and check in at the registration area.  Wear comfortable clothing and clothing appropriate for easy access to any Portacath or PICC line.   We strive to give you quality time with your provider. You may need to reschedule your appointment if you arrive late (15 or more minutes).  Arriving late affects you and other patients whose appointments are after yours.  Also, if you miss three or more appointments without notifying the office, you may be dismissed from the clinic at the providers discretion.      For prescription refill requests, have your pharmacy contact our office and allow 72 hours for refills to be completed.    Today you received the following chemotherapy and/or immunotherapy agents Sarclisa      To help prevent nausea and vomiting after your treatment, we encourage you to take your nausea medication as directed.  BELOW ARE SYMPTOMS THAT SHOULD BE REPORTED IMMEDIATELY: *FEVER GREATER THAN 100.4 F (38 C) OR HIGHER *CHILLS OR SWEATING *NAUSEA AND VOMITING THAT IS NOT CONTROLLED WITH YOUR NAUSEA MEDICATION *UNUSUAL SHORTNESS OF BREATH *UNUSUAL BRUISING OR BLEEDING *URINARY PROBLEMS (pain or burning when urinating, or frequent urination) *BOWEL PROBLEMS (unusual diarrhea, constipation, pain near the anus) TENDERNESS IN MOUTH AND THROAT WITH OR WITHOUT PRESENCE OF ULCERS (sore throat, sores in mouth, or a toothache) UNUSUAL RASH, SWELLING OR PAIN  UNUSUAL VAGINAL DISCHARGE OR ITCHING   Items with * indicate a potential emergency and should be followed up as soon as possible or go to the Emergency Department if any problems should occur.  Please show the CHEMOTHERAPY ALERT CARD or IMMUNOTHERAPY ALERT CARD at check-in to the  Emergency Department and triage nurse. Should you have questions after your visit or need to cancel or reschedule your appointment, please contact [facility]  [phone] and follow the prompts.  Office hours are 8:00 a.m. to 4:30 p.m. Monday - Friday. Please note that voicemails left after 4:00 p.m. may not be returned until the following business day.  We are closed weekends and major holidays. You have access to a nurse at all times for urgent questions. Please call the main number to the clinic [phone] and follow the prompts.  For any non-urgent questions, you may also contact your provider using MyChart. We now offer e-Visits for anyone 58 and older to request care online for non-urgent symptoms. For details visit [email or web address].   Also download the MyChart app! Go to the app store, search "MyChart", open the app, select [facility], and log in with your MyChart username and password.  Due to Covid, a mask is required upon entering the hospital/clinic. If you do not have a mask, one will be given to you upon arrival. For doctor visits, patients may have 1 support person aged 50 or older with them. For treatment visits, patients cannot have anyone with them due to current Covid guidelines and our immunocompromised population.

## 2021-01-19 ENCOUNTER — Inpatient Hospital Stay: Payer: Medicare Other

## 2021-01-19 ENCOUNTER — Inpatient Hospital Stay: Payer: Medicare Other | Admitting: Hematology & Oncology

## 2021-01-19 LAB — KAPPA/LAMBDA LIGHT CHAINS
Kappa free light chain: 1 mg/L — ABNORMAL LOW (ref 3.3–19.4)
Kappa, lambda light chain ratio: 0.02 — ABNORMAL LOW (ref 0.26–1.65)
Lambda free light chains: 51 mg/L — ABNORMAL HIGH (ref 5.7–26.3)

## 2021-01-19 LAB — IGG, IGA, IGM
IgA: <5 mg/dL — ABNORMAL LOW (ref 87–352)
IgG (Immunoglobin G), Serum: 225 mg/dL — ABNORMAL LOW (ref 586–1602)
IgM (Immunoglobulin M), Srm: <5 mg/dL — ABNORMAL LOW (ref 26–217)

## 2021-01-20 ENCOUNTER — Other Ambulatory Visit: Payer: Self-pay

## 2021-01-20 ENCOUNTER — Ambulatory Visit (INDEPENDENT_AMBULATORY_CARE_PROVIDER_SITE_OTHER): Payer: Medicare Other | Admitting: Internal Medicine

## 2021-01-20 ENCOUNTER — Encounter: Payer: Self-pay | Admitting: Internal Medicine

## 2021-01-20 VITALS — BP 132/70 | HR 84 | Temp 100.3°F | Ht 64.0 in | Wt 175.6 lb

## 2021-01-20 DIAGNOSIS — Z6830 Body mass index (BMI) 30.0-30.9, adult: Secondary | ICD-10-CM

## 2021-01-20 DIAGNOSIS — R21 Rash and other nonspecific skin eruption: Secondary | ICD-10-CM

## 2021-01-20 DIAGNOSIS — E6609 Other obesity due to excess calories: Secondary | ICD-10-CM | POA: Diagnosis not present

## 2021-01-20 DIAGNOSIS — R300 Dysuria: Secondary | ICD-10-CM | POA: Diagnosis not present

## 2021-01-20 LAB — POCT URINALYSIS DIPSTICK
Bilirubin, UA: NEGATIVE
Glucose, UA: NEGATIVE
Ketones, UA: NEGATIVE
Nitrite, UA: POSITIVE
Protein, UA: POSITIVE — AB
Spec Grav, UA: 1.02 (ref 1.010–1.025)
Urobilinogen, UA: 0.2 E.U./dL
pH, UA: 6.5 (ref 5.0–8.0)

## 2021-01-20 MED ORDER — ACYCLOVIR 5 % EX OINT: TOPICAL_OINTMENT | CUTANEOUS | 0 refills | Status: DC

## 2021-01-20 MED ORDER — VALACYCLOVIR HCL 1 G PO TABS: 1000.0000 mg | ORAL_TABLET | Freq: Three times a day (TID) | ORAL | 0 refills | Status: DC

## 2021-01-20 NOTE — Progress Notes (Signed)
Emily Greer,acting as a Neurosurgeon for Emily Aliment, MD.,have documented all relevant documentation on the behalf of Emily Aliment, MD,as directed by  Emily Aliment, MD while in the presence of Emily Aliment, MD.  This visit occurred during the SARS-CoV-2 public health emergency.  Safety protocols were in place, including screening questions prior to the visit, additional usage of staff PPE, and extensive cleaning of exam room while observing appropriate contact time as indicated for disinfecting solutions.  Subjective:     Patient ID: Emily Greer , female    DOB: [birth year] , 70 y.o.   MRN: [id number]   Chief Complaint  Patient presents with   Rash    HPI  She is here today for further evaluation of a rash. First noticed it a week ago. Described as a painful rash, initially located on her right buttocks -it is now painful to sit. Denies h/o shingles/hsv-2. She is on acyclovir per Oncology. Under active treatment for MM now. States she recently started a new chemotherapy drug b/c her previous treatment was recalled by the CDC. She is unable to state the name of the new medication. Started new medication one week ago.   Rash This is a new problem. The current episode started 1 to 4 weeks ago. The problem has been gradually worsening since onset. Location: In her rectum and spreading to the front. The rash is characterized by burning. She was exposed to nothing. Associated symptoms include a fever. Treatments tried: vicks. The treatment provided no relief.    Past Medical History:  Diagnosis Date   Avascular necrosis of femoral head (HCC)    bilateral per CT 07-26-2015   Endometrial carcinoma [facility]) gyn oncologist-  dr Nelly Rout ([facility])/  radiation oncologist-- dr Roselind Messier   2013 dx  FIGO Stage 1A, Grade 2 endometrioid endometrial cancer s/p TAH w/ BSO and bilateral pelvic node dissection 10-31-2011 ;  recurrence at distal vagina 04/ 2014 s/p  brachytherapy (ended  07-29-2012);  2nd recurrence 12/ 2016  vaginal apex s/p  conformational radiotherapy 03-10-2015 to 04-20-2015   Family history of adverse reaction to anesthesia    mother ponv   GERD (gastroesophageal reflux disease)    Goals of care, counseling/discussion 08/21/2019   H/O stem cell transplant (HCC)    02/ 2000 and second one 06/ 2016   History of bacteremia    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   History of chemotherapy    last chemo 12-26-17   History of radiation therapy 6/4, 6/11, 6/19, 6/25, 07/29/2012   vagina 30.5 gray in 5 fx, HDR brachytherapy:   last radiation to vagina 03-10-2015 to 04-20-2015  50.4gray   History of radiation therapy 03/10/15-04/20/15   vagina 50.4 in 28 fractions   Hypertension    Lambda light chain myeloma [facility]) oncologist-  dr Myna Hidalgo ([facility])  and  [facility] -- dr Silvestre Mesi gasparetto   dx 07/ 1999 s/p  VAD chemotherapy 11/ 1999,  purged autotransplant 03-21-1998 followed by auto stem cell transplant 03-29-1998;  recurrance w/ second autologous stem cell transplant 07-24-2014;  in Re-mission currently , chemo maintenance therapy   Osteoporosis 12/18/05   Increased  risk    PONV (postoperative nausea and vomiting)    Wears glasses      Family History  Problem Relation Age of Onset   Colon cancer Mother    Hypertension Father    Heart Problems Father      Current Outpatient Medications:  albuterol (VENTOLIN HFA) 108 (90 Base) MCG/ACT inhaler, Inhale 2 puffs into the lungs every 6 (six) hours as needed for wheezing or shortness of breath. 2 puffs 3 times daily x 5 days then every 6 hours as needed., Disp: 18 g, Rfl: 11   aspirin EC 81 MG tablet, Take 81 mg by mouth daily., Disp: , Rfl:    Calcium-Magnesium-Vitamin D (CALCIUM 1200+D3 PO), Take 1 tablet by mouth daily., Disp: , Rfl:    cetirizine (ZYRTEC) 10 MG tablet, Take 1 tablet (10 mg total) by mouth at bedtime., Disp: 90 tablet, Rfl: 1   chlorhexidine (PERIDEX) 0.12 %  solution, SMARTSIG:By Mouth, Disp: , Rfl:    Cholecalciferol (VITAMIN D3) 2000 UNITS TABS, Take 2 tablets by mouth daily., Disp: , Rfl:    Fluocinolone Acetonide Scalp 0.01 % OIL, Apply topically., Disp: , Rfl:    hydrochlorothiazide (MICROZIDE) 12.5 MG capsule, Take 12.5 mg by mouth every morning., Disp: , Rfl:    letrozole (FEMARA) 2.5 MG tablet, Take 1 tablet (2.5 mg total) by mouth daily., Disp: 60 tablet, Rfl: 4   lidocaine-prilocaine (EMLA) cream, Apply 1 application topically as needed., Disp: 30 g, Rfl: 3   loratadine (CLARITIN) 10 MG tablet, Take 10 mg by mouth every morning. Reported on 08/19/2015, Disp: , Rfl:    metoprolol succinate (TOPROL-XL) 50 MG 24 hr tablet, TAKE 1 TABLET(50 MG) BY MOUTH EVERY MORNING, Disp: 90 tablet, Rfl: 3   ondansetron (ZOFRAN) 8 MG tablet, Take 1 tablet (8 mg total) by mouth 2 (two) times daily as needed (Nausea or vomiting)., Disp: 30 tablet, Rfl: 1   potassium chloride SA (KLOR-CON) 20 MEQ tablet, TAKE 1 TABLET(20 MEQ) BY MOUTH TWICE DAILY, Disp: 60 tablet, Rfl: 3   Probiotic Product (PROBIOTIC DAILY PO), Take by mouth daily., Disp: , Rfl:    triamcinolone (NASACORT) 55 MCG/ACT AERO nasal inhaler, Place 2 sprays into the nose as needed. , Disp: , Rfl:    triamterene-hydrochlorothiazide (MAXZIDE-25) 37.5-25 MG tablet, 1 tablet by mouth at bedtime, Disp: 90 tablet, Rfl: 2   XIIDRA 5 % SOLN, , Disp: , Rfl:    Zinc 50 MG TABS, Take 50 mg by mouth daily., Disp: , Rfl:    zinc gluconate 50 MG tablet, Take 50 mg by mouth daily., Disp: , Rfl:    acyclovir ointment (ZOVIRAX) 5 %, Apply thin layer to affected area two to three times daily prn, Disp: 30 g, Rfl: 0   amLODipine (NORVASC) 10 MG tablet, TAKE 1 TABLET(10 MG) BY MOUTH EVERY MORNING, Disp: 90 tablet, Rfl: 2   montelukast (SINGULAIR) 10 MG tablet, TAKE 1 TABLET(10 MG) BY MOUTH AT BEDTIME, Disp: 90 tablet, Rfl: 2   valACYclovir (VALTREX) 1000 MG tablet, Take 0.5 tablets (500 mg total) by mouth daily., Disp: 30  tablet, Rfl: 12 No current facility-administered medications for this visit.  Facility-Administered Medications Ordered in Other Visits:    0.9 %  sodium chloride infusion, , Intravenous, Once PRN, Ennever, Rose Phi, MD   albuterol (PROVENTIL) (2.5 MG/3ML) 0.083% nebulizer solution 2.5 mg, 2.5 mg, Nebulization, Once PRN, Ennever, Rose Phi, MD   sodium chloride flush (NS) 0.9 % injection 10 mL, 10 mL, Intravenous, PRN, Erenest Blank, NP, 10 mL at 12/26/17 1130   sodium chloride flush (NS) 0.9 % injection 10 mL, 10 mL, Intravenous, PRN, Josph Macho, MD, 10 mL at 04/24/19 1305   sodium chloride flush (NS) 0.9 % injection 10 mL, 10 mL, Intravenous, PRN, Josph Macho, MD,  10 mL at 08/21/19 1233   sodium chloride flush (NS) 0.9 % injection 10 mL, 10 mL, Intracatheter, PRN, Erenest Blank, NP, 10 mL at 10/01/19 1053   sodium chloride flush (NS) 0.9 % injection 10 mL, 10 mL, Intracatheter, PRN, Erenest Blank, NP, 10 mL at 12/11/19 1414   Allergies  Allergen Reactions   Codeine Nausea Only     Review of Systems  Constitutional:  Positive for fever.  Respiratory: Negative.    Cardiovascular: Negative.   Gastrointestinal: Negative.   Skin:  Positive for rash.  Neurological: Negative.   Psychiatric/Behavioral: Negative.      Today's Vitals   01/20/21 1159  BP: 132/70  Pulse: 84  Temp: 100.3 F (37.9 C)  Weight: 175 lb 9.6 oz (79.7 kg)  Height: 5\' 4"  (1.626 m)  PainSc: 0-No pain   Body mass index is 30.14 kg/m.  Wt Readings from Last 3 Encounters:  02/01/21 171 lb (77.6 kg)  01/20/21 175 lb 9.6 oz (79.7 kg)  01/12/21 175 lb (79.4 kg)     Objective:  Physical Exam Vitals and nursing note reviewed.  Constitutional:      Appearance: Normal appearance.  HENT:     Head: Normocephalic and atraumatic.     Nose:     Comments: Masked     Mouth/Throat:     Comments: Masked  Cardiovascular:     Rate and Rhythm: Normal rate and regular rhythm.     Heart sounds: Normal  heart sounds.  Pulmonary:     Effort: Pulmonary effort is normal.     Breath sounds: Normal breath sounds.  Skin:    General: Skin is warm.     Findings: Rash present.     Comments: Clusters of vesicular lesions on right side of perineum.   Neurological:     General: No focal deficit present.     Mental Status: She is alert.  Psychiatric:        Mood and Affect: Mood normal.        Behavior: Behavior normal.        Assessment And Plan:     1. Rash and nonspecific skin eruption Comments: Suggestive of shingles vs herpes outbreak. She denies h/o herpes. Will send rx valacyclovir 1gm tid - all questions answered to her satisfaction.  - Varicella zoster antibody, IgG - Varicella zoster antibody, IgM  2. Dysuria Comments: I will check urinalysis to r/o UTI.  - POCT Urinalysis Dipstick (13244) - Urine Culture  3. Class 1 obesity due to excess calories with serious comorbidity and body mass index (BMI) of 30.0 to 30.9 in adult Comments: She is enocuraged to aim for at least 150 minutes of exercise per week.    Patient was given opportunity to ask questions. Patient verbalized understanding of the plan and was able to repeat key elements of the plan. All questions were answered to their satisfaction.    I, Emily Aliment, MD, have reviewed all documentation for this visit. The documentation on 01/20/21 for the exam, diagnosis, procedures, and orders are all accurate and complete.   IF YOU HAVE BEEN REFERRED TO A SPECIALIST, IT MAY TAKE 1-2 WEEKS TO SCHEDULE/PROCESS THE REFERRAL. IF YOU HAVE NOT HEARD FROM US/SPECIALIST IN TWO WEEKS, PLEASE GIVE [place] A CALL AT [phone].   THE PATIENT IS ENCOURAGED TO PRACTICE SOCIAL DISTANCING DUE TO THE COVID-19 PANDEMIC.

## 2021-01-20 NOTE — Patient Instructions (Addendum)
Stop Acyclovir Start Valacyclovir - take as directed   Shingles Shingles is an infection. It gives you a painful skin rash and blisters that have fluid in them. Shingles is caused by the same germ (virus) that causes chickenpox. Shingles only happens in people who: Have had chickenpox. Have been given a shot (vaccine) to protect against chickenpox. Shingles is rare in this group. What are the causes? This condition is caused by varicella-zoster virus. This is the same germ that causes chickenpox. After a person is exposed to the germ, the germ stays in the body but is not active (dormant). Shingles develops if the germ becomes active again (is reactivated). This can happen many years after the first exposure to the germ. It is not known what causes this germ to become active again. What increases the risk? People who have had chickenpox or received the chickenpox shot are at risk for shingles. This infection is more common in people who: Are older than 68 years of age. Have a weakened disease-fighting system (immune system), such as people with: HIV (human immunodeficiency virus). AIDS (acquired immunodeficiency syndrome). Cancer. Are taking medicines that weaken the immune system, such as organ transplant medicines. Have a lot of stress. What are the signs or symptoms? The first symptoms of shingles may be itching, tingling, or pain in an area on your skin. A rash will show on your skin a few days or weeks later. This is what usually happens: The rash is likely to be on one side of your body. The rash usually has a shape like a belt or a band. Over time, the rash turns into fluid-filled blisters. The blisters will break open and change into scabs. The scabs usually dry up in about 2-3 weeks. You may also have: A fever. Chills. A headache. A feeling like you may vomit (nausea). How is this treated? The rash may last for several weeks. There is not a specific cure for this  condition. Your doctor may prescribe medicines. Medicines may: Help with pain. Help you get better sooner. Help to prevent long-term problems. Help with itching (antihistamines). If the area involved is on your face, you may need to see a specialist. This may be an eye doctor or an ear, nose, and throat (ENT) doctor. Follow these instructions at home: Medicines Take over-the-counter and prescription medicines only as told by your doctor. Put on an anti-itch cream or numbing cream where you have a rash, blisters, or scabs. Do this as told by your doctor. Helping with itching and discomfort  Put cold, wet cloths (cold compresses) on the area of the rash or blisters as told by your doctor. Cool baths can help you feel better. Try adding baking soda or dry oatmeal to the water to lessen itching. Do not bathe in hot water. Use calamine lotion as told by your doctor. Blister and rash care Keep your rash covered with a loose bandage (dressing). Wear loose clothing that does not rub on your rash. Wash your hands with soap and water for at least 20 seconds before and after you change your bandage. If you cannot use soap and water, use hand sanitizer. Change your bandage as told by your doctor. Keep your rash and blisters clean. To do this, wash the area with mild soap and cool water as told by your doctor. Check your rash every day for signs of infection. Check for: More redness, swelling, or pain. Fluid or blood. Warmth. Pus or a bad smell. Do not scratch  your rash. Do not pick at your blisters. To help you to not scratch: Keep your fingernails clean and cut short. Wear gloves or mittens when you sleep, if scratching is a problem. General instructions Rest as told by your doctor. Wash your hands often with soap and water for at least 20 seconds. If you cannot use soap and water, use hand sanitizer. Doing this lowers your chance of getting a skin infection. Your infection can cause chickenpox  in people who have never had chickenpox or never got a chickenpox vaccine shot. If you have blisters that did not change into scabs yet, try not to touch other people or be around other people, especially: Babies. Pregnant women. Children who have areas of red, itchy, or rough skin (eczema). Older people who have organ transplants. People who have a long-term (chronic) illness, like cancer or AIDS. Keep all follow-up visits. How is this prevented? A vaccine shot is the best way to prevent shingles and protect against shingles problems. If you have not had a vaccine shot, talk with your doctor about getting it. Where to find more information Centers for Disease Control and Prevention: http://www.wolf.info/ Contact a doctor if: Your pain does not get better with medicine. Your pain does not get better after the rash heals. You have any of these signs of infection around the rash: More redness, swelling, or pain. Fluid or blood. Warmth. Pus or a bad smell. You have a fever. Get help right away if: The rash is on your face or nose. You have pain in your face or pain by your eye. You lose feeling on one side of your face. You have trouble seeing. You have ear pain, or you have ringing in your ear. You have a loss of taste. Your condition gets worse. Summary Shingles gives you a painful skin rash and blisters that have fluid in them. Shingles is caused by the same germ (virus) that causes chickenpox. Keep your rash covered with a loose bandage. Wear loose clothing that does not rub on your rash. If you have blisters that did not change into scabs yet, try not to touch other people or be around people. This information is not intended to replace advice given to you by your health care provider. Make sure you discuss any questions you have with your health care provider. Document Revised: 01/12/2020 Document Reviewed: 01/12/2020 Elsevier Patient Education  2022 [employer].

## 2021-01-21 ENCOUNTER — Other Ambulatory Visit: Payer: Self-pay | Admitting: *Deleted

## 2021-01-21 DIAGNOSIS — C9001 Multiple myeloma in remission: Secondary | ICD-10-CM

## 2021-01-21 DIAGNOSIS — C541 Malignant neoplasm of endometrium: Secondary | ICD-10-CM

## 2021-01-21 DIAGNOSIS — C9000 Multiple myeloma not having achieved remission: Secondary | ICD-10-CM

## 2021-01-25 ENCOUNTER — Other Ambulatory Visit: Payer: Self-pay

## 2021-01-25 ENCOUNTER — Inpatient Hospital Stay: Payer: Medicare Other

## 2021-01-25 VITALS — BP 142/70 | HR 68 | Temp 98.3°F | Resp 20

## 2021-01-25 DIAGNOSIS — Z5112 Encounter for antineoplastic immunotherapy: Secondary | ICD-10-CM | POA: Diagnosis not present

## 2021-01-25 DIAGNOSIS — C541 Malignant neoplasm of endometrium: Secondary | ICD-10-CM

## 2021-01-25 DIAGNOSIS — H538 Other visual disturbances: Secondary | ICD-10-CM | POA: Diagnosis not present

## 2021-01-25 DIAGNOSIS — C9000 Multiple myeloma not having achieved remission: Secondary | ICD-10-CM | POA: Diagnosis not present

## 2021-01-25 DIAGNOSIS — C9001 Multiple myeloma in remission: Secondary | ICD-10-CM

## 2021-01-25 DIAGNOSIS — D500 Iron deficiency anemia secondary to blood loss (chronic): Secondary | ICD-10-CM | POA: Diagnosis not present

## 2021-01-25 DIAGNOSIS — R58 Hemorrhage, not elsewhere classified: Secondary | ICD-10-CM | POA: Diagnosis not present

## 2021-01-25 DIAGNOSIS — Z79899 Other long term (current) drug therapy: Secondary | ICD-10-CM | POA: Diagnosis not present

## 2021-01-25 LAB — CMP (CANCER CENTER ONLY)
ALT: 36 U/L (ref 0–44)
AST: 22 U/L (ref 15–41)
Albumin: 3.8 g/dL (ref 3.5–5.0)
Alkaline Phosphatase: 57 U/L (ref 38–126)
Anion gap: 8 (ref 5–15)
BUN: 15 mg/dL (ref 8–23)
CO2: 28 mmol/L (ref 22–32)
Calcium: 9.5 mg/dL (ref 8.9–10.3)
Chloride: 104 mmol/L (ref 98–111)
Creatinine: 0.88 mg/dL (ref 0.44–1.00)
GFR, Estimated: >60 mL/min (ref >60)
Glucose, Bld: 92 mg/dL (ref 70–99)
Potassium: 3.5 mmol/L (ref 3.5–5.1)
Sodium: 140 mmol/L (ref 135–145)
Total Bilirubin: 0.4 mg/dL (ref 0.3–1.2)
Total Protein: 6 g/dL — ABNORMAL LOW (ref 6.5–8.1)

## 2021-01-25 LAB — CBC WITH DIFFERENTIAL (CANCER CENTER ONLY)
Abs Immature Granulocytes: 0.03 10*3/uL (ref 0.00–0.07)
Basophils Absolute: 0 10*3/uL (ref 0.0–0.1)
Basophils Relative: 1 %
Eosinophils Absolute: 0.1 10*3/uL (ref 0.0–0.5)
Eosinophils Relative: 2 %
HCT: 29.6 % — ABNORMAL LOW (ref 36.0–46.0)
Hemoglobin: 9.5 g/dL — ABNORMAL LOW (ref 12.0–15.0)
Immature Granulocytes: 1 %
Lymphocytes Relative: 14 %
Lymphs Abs: 0.5 10*3/uL — ABNORMAL LOW (ref 0.7–4.0)
MCH: 25.7 pg — ABNORMAL LOW (ref 26.0–34.0)
MCHC: 32.1 g/dL (ref 30.0–36.0)
MCV: 80.2 fL (ref 80.0–100.0)
Monocytes Absolute: 0.4 10*3/uL (ref 0.1–1.0)
Monocytes Relative: 10 %
Neutro Abs: 2.8 10*3/uL (ref 1.7–7.7)
Neutrophils Relative %: 72 %
Platelet Count: 132 10*3/uL — ABNORMAL LOW (ref 150–400)
RBC: 3.69 MIL/uL — ABNORMAL LOW (ref 3.87–5.11)
RDW: 18.3 % — ABNORMAL HIGH (ref 11.5–15.5)
WBC Count: 3.9 10*3/uL — ABNORMAL LOW (ref 4.0–10.5)
nRBC: 0 % (ref 0.0–0.2)

## 2021-01-25 LAB — VARICELLA ZOSTER ANTIBODY, IGG: Varicella zoster IgG: <135 index — ABNORMAL LOW (ref >165)

## 2021-01-25 LAB — VARICELLA ZOSTER ANTIBODY, IGM: Varicella IgM: <0.91 index (ref 0.00–0.90)

## 2021-01-25 MED ORDER — SODIUM CHLORIDE 0.9 % IV SOLN
10.0000 mg/kg | Freq: Once | INTRAVENOUS | Status: AC
  Administered 2021-01-25: 11:00:00 800 mg via INTRAVENOUS
  Filled 2021-01-25: qty 15

## 2021-01-25 MED ORDER — DIPHENHYDRAMINE HCL 50 MG/ML IJ SOLN
50.0000 mg | Freq: Once | INTRAMUSCULAR | Status: AC
  Administered 2021-01-25: 10:00:00 50 mg via INTRAVENOUS
  Filled 2021-01-25: qty 1

## 2021-01-25 MED ORDER — HEPARIN SOD (PORK) LOCK FLUSH 100 UNIT/ML IV SOLN
500.0000 [IU] | Freq: Once | INTRAVENOUS | Status: AC | PRN
  Administered 2021-01-25: 13:00:00 500 [IU]

## 2021-01-25 MED ORDER — SODIUM CHLORIDE 0.9% FLUSH
10.0000 mL | INTRAVENOUS | Status: DC | PRN
  Administered 2021-01-25: 13:00:00 10 mL

## 2021-01-25 MED ORDER — SODIUM CHLORIDE 0.9 % IV SOLN
20.0000 mg | Freq: Once | INTRAVENOUS | Status: AC
  Administered 2021-01-25: 10:00:00 20 mg via INTRAVENOUS
  Filled 2021-01-25: qty 20

## 2021-01-25 MED ORDER — ACETAMINOPHEN 325 MG PO TABS
650.0000 mg | ORAL_TABLET | Freq: Once | ORAL | Status: AC
  Administered 2021-01-25: 10:00:00 650 mg via ORAL
  Filled 2021-01-25: qty 2

## 2021-01-25 MED ORDER — SODIUM CHLORIDE 0.9 % IV SOLN: Freq: Once | INTRAVENOUS | Status: AC

## 2021-01-25 MED ORDER — PALONOSETRON HCL INJECTION 0.25 MG/5ML
0.2500 mg | Freq: Once | INTRAVENOUS | Status: AC
  Administered 2021-01-25: 10:00:00 0.25 mg via INTRAVENOUS
  Filled 2021-01-25: qty 5

## 2021-01-25 MED ORDER — FAMOTIDINE 20 MG IN NS 100 ML IVPB
20.0000 mg | Freq: Two times a day (BID) | INTRAVENOUS | Status: DC
  Administered 2021-01-25: 20 mg via INTRAVENOUS
  Filled 2021-01-25: qty 20

## 2021-01-25 NOTE — Patient Instructions (Signed)
Isatuximab injection What is this medication? ISATUXIMAB (eye sa tux i mab) is a monoclonal antibody. It is used to treat multiple myeloma. This medicine may be used for other purposes; ask your health care provider or pharmacist if you have questions. COMMON BRAND NAME(S): SARCLISA What should I tell my care team before I take this medication? They need to know if you have any of these conditions: Heart disease Infection especially a viral infection, such as chickenpox, cold sores, herpes An unusual or allergic reaction to isatuximab, other medications, foods, dyes, or preservatives Pregnant or trying to get pregnant Breast-feeding How should I use this medication? This medication is injected into a vein. It is given by your care team in a hospital or clinic setting. Talk to your care team regarding the use of this medication in children. Special care may be needed. Overdosage: If you think you have taken too much of this medicine contact a poison control center or emergency room at once. NOTE: This medicine is only for you. Do not share this medicine with others. What if I miss a dose? Keep appointments for follow-up doses. It is important not to miss your dose. Call your care team if you are unable to keep an appointment. What may interact with this medication? Interactions have not been studied. This list may not describe all possible interactions. Give your health care provider a list of all the medicines, herbs, non-prescription drugs, or dietary supplements you use. Also tell them if you smoke, drink alcohol, or use illegal drugs. Some items may interact with your medicine. What should I watch for while using this medication? You may need blood work done while you are taking this medicine. Call your doctor or health care professional for advice if you get a fever, chills or sore throat, or other symptoms of a cold or flu. Do not treat yourself. This drug decreases your body's ability  to fight infections. Try to avoid being around people who are sick. Talk to your doctor about your risk of cancer. You may be more at risk for certain types of cancers if you take this medicine. This medicine can affect the results of blood tests to match your blood type. Your healthcare provider will do blood tests to match your blood type before you start treatment. Tell all of your healthcare providers that you are being treated with this medicine before receiving a blood transfusion. This medicine can affect the results of some tests used to determine treatment response; extra tests may be needed to evaluate response. Do not become pregnant while taking this medicine or for at least 5 months after stopping it. Women should inform their doctor if they wish to become pregnant or think they might be pregnant. There is a potential for serious side effects to an unborn child. Talk to your health care professional or pharmacist for more information. Do not breast-feed an infant while taking this medicine. What side effects may I notice from receiving this medication? Side effects that you should report to your doctor or health care professional as soon as possible: allergic reactions (skin rash; itching or hives; swelling of the face, lips, or tongue) headache heart failure (trouble breathing; chest pain; dizziness; fast, irregular heartbeat; feeling faint or lightheaded, falls) infection (fever, chills, cough, sore throat, pain or trouble passing urine) nasal congestion (runny or stuffy nose) Side effects that usually do not require medical attention (report these to your doctor or health care professional if they continue or are bothersome):  back pain diarrhea increase in blood pressure trouble sleeping unusually weak or tired vomiting This list may not describe all possible side effects. Call your doctor for medical advice about side effects. You may report side effects to FDA at  1-800-FDA-1088. Where should I keep my medication? This medication is given in a hospital or clinic. It will not be stored at home. NOTE: This sheet is a summary. It may not cover all possible information. If you have questions about this medicine, talk to your doctor, pharmacist, or health care provider.  2022 Elsevier/Gold Standard (2020-10-05 00:00:00)

## 2021-01-26 LAB — PROTEIN ELECTROPHORESIS, SERUM, WITH REFLEX
A/G Ratio: 1.3 (ref 0.7–1.7)
Albumin ELP: 3.3 g/dL (ref 2.9–4.4)
Alpha-1-Globulin: 0.3 g/dL (ref 0.0–0.4)
Alpha-2-Globulin: 0.9 g/dL (ref 0.4–1.0)
Beta Globulin: 1 g/dL (ref 0.7–1.3)
Gamma Globulin: 0.3 g/dL — ABNORMAL LOW (ref 0.4–1.8)
Globulin, Total: 2.5 g/dL (ref 2.2–3.9)
M-Spike, %: 0.1 g/dL — ABNORMAL HIGH
SPEP Interpretation: 0
Total Protein ELP: 5.8 g/dL — ABNORMAL LOW (ref 6.0–8.5)

## 2021-01-26 LAB — IMMUNOFIXATION REFLEX, SERUM

## 2021-01-27 ENCOUNTER — Telehealth: Payer: Self-pay

## 2021-01-27 ENCOUNTER — Other Ambulatory Visit: Payer: Self-pay

## 2021-01-27 MED ORDER — ACYCLOVIR 5 % EX OINT: TOPICAL_OINTMENT | CUTANEOUS | 0 refills | Status: DC

## 2021-01-27 NOTE — Telephone Encounter (Signed)
I called patient to see how she was doing she stated she is doing better the rash is getting better especially while using the cream. YL,RMA

## 2021-01-29 ENCOUNTER — Other Ambulatory Visit: Payer: Self-pay | Admitting: Internal Medicine

## 2021-01-29 LAB — URINE CULTURE

## 2021-01-29 MED ORDER — NITROFURANTOIN MONOHYD MACRO 100 MG PO CAPS: 100.0000 mg | ORAL_CAPSULE | Freq: Two times a day (BID) | ORAL | 0 refills | Status: AC

## 2021-01-30 ENCOUNTER — Other Ambulatory Visit: Payer: Self-pay | Admitting: Internal Medicine

## 2021-01-30 ENCOUNTER — Encounter: Payer: Self-pay | Admitting: Hematology & Oncology

## 2021-01-30 DIAGNOSIS — I10 Essential (primary) hypertension: Secondary | ICD-10-CM

## 2021-02-01 ENCOUNTER — Other Ambulatory Visit: Payer: Self-pay

## 2021-02-01 ENCOUNTER — Encounter: Payer: Self-pay | Admitting: Hematology & Oncology

## 2021-02-01 ENCOUNTER — Other Ambulatory Visit: Payer: Self-pay | Admitting: Internal Medicine

## 2021-02-01 ENCOUNTER — Inpatient Hospital Stay: Payer: Medicare PPO

## 2021-02-01 ENCOUNTER — Inpatient Hospital Stay: Payer: Medicare PPO | Attending: Hematology & Oncology

## 2021-02-01 ENCOUNTER — Inpatient Hospital Stay (HOSPITAL_BASED_OUTPATIENT_CLINIC_OR_DEPARTMENT_OTHER): Payer: Medicare PPO | Admitting: Hematology & Oncology

## 2021-02-01 VITALS — BP 125/74 | HR 70 | Temp 97.5°F | Resp 16

## 2021-02-01 VITALS — BP 114/71 | HR 99 | Temp 99.4°F | Resp 16 | Wt 171.0 lb

## 2021-02-01 DIAGNOSIS — Z8542 Personal history of malignant neoplasm of other parts of uterus: Secondary | ICD-10-CM | POA: Insufficient documentation

## 2021-02-01 DIAGNOSIS — Z79899 Other long term (current) drug therapy: Secondary | ICD-10-CM | POA: Insufficient documentation

## 2021-02-01 DIAGNOSIS — Z885 Allergy status to narcotic agent status: Secondary | ICD-10-CM | POA: Insufficient documentation

## 2021-02-01 DIAGNOSIS — K922 Gastrointestinal hemorrhage, unspecified: Secondary | ICD-10-CM | POA: Diagnosis not present

## 2021-02-01 DIAGNOSIS — D500 Iron deficiency anemia secondary to blood loss (chronic): Secondary | ICD-10-CM | POA: Diagnosis not present

## 2021-02-01 DIAGNOSIS — H538 Other visual disturbances: Secondary | ICD-10-CM | POA: Insufficient documentation

## 2021-02-01 DIAGNOSIS — Z5112 Encounter for antineoplastic immunotherapy: Secondary | ICD-10-CM | POA: Diagnosis present

## 2021-02-01 DIAGNOSIS — C9000 Multiple myeloma not having achieved remission: Secondary | ICD-10-CM | POA: Insufficient documentation

## 2021-02-01 DIAGNOSIS — C541 Malignant neoplasm of endometrium: Secondary | ICD-10-CM

## 2021-02-01 DIAGNOSIS — Z923 Personal history of irradiation: Secondary | ICD-10-CM | POA: Diagnosis not present

## 2021-02-01 DIAGNOSIS — M7989 Other specified soft tissue disorders: Secondary | ICD-10-CM | POA: Diagnosis not present

## 2021-02-01 DIAGNOSIS — Z9484 Stem cells transplant status: Secondary | ICD-10-CM | POA: Diagnosis not present

## 2021-02-01 DIAGNOSIS — C9001 Multiple myeloma in remission: Secondary | ICD-10-CM

## 2021-02-01 LAB — CBC WITH DIFFERENTIAL (CANCER CENTER ONLY)
Abs Immature Granulocytes: 0.05 10*3/uL (ref 0.00–0.07)
Basophils Absolute: 0.1 10*3/uL (ref 0.0–0.1)
Basophils Relative: 1 %
Eosinophils Absolute: 0 10*3/uL (ref 0.0–0.5)
Eosinophils Relative: 1 %
HCT: 31.3 % — ABNORMAL LOW (ref 36.0–46.0)
Hemoglobin: 10.1 g/dL — ABNORMAL LOW (ref 12.0–15.0)
Immature Granulocytes: 1 %
Lymphocytes Relative: 16 %
Lymphs Abs: 0.7 10*3/uL (ref 0.7–4.0)
MCH: 25.8 pg — ABNORMAL LOW (ref 26.0–34.0)
MCHC: 32.3 g/dL (ref 30.0–36.0)
MCV: 80.1 fL (ref 80.0–100.0)
Monocytes Absolute: 0.5 10*3/uL (ref 0.1–1.0)
Monocytes Relative: 11 %
Neutro Abs: 3.1 10*3/uL (ref 1.7–7.7)
Neutrophils Relative %: 70 %
Platelet Count: 149 10*3/uL — ABNORMAL LOW (ref 150–400)
RBC: 3.91 MIL/uL (ref 3.87–5.11)
RDW: 18.7 % — ABNORMAL HIGH (ref 11.5–15.5)
WBC Count: 4.4 10*3/uL (ref 4.0–10.5)
nRBC: 0 % (ref 0.0–0.2)

## 2021-02-01 LAB — CMP (CANCER CENTER ONLY)
ALT: 20 U/L (ref 0–44)
AST: 15 U/L (ref 15–41)
Albumin: 4.2 g/dL (ref 3.5–5.0)
Alkaline Phosphatase: 50 U/L (ref 38–126)
Anion gap: 9 (ref 5–15)
BUN: 20 mg/dL (ref 8–23)
CO2: 27 mmol/L (ref 22–32)
Calcium: 9.6 mg/dL (ref 8.9–10.3)
Chloride: 104 mmol/L (ref 98–111)
Creatinine: 0.89 mg/dL (ref 0.44–1.00)
GFR, Estimated: >60 mL/min (ref >60)
Glucose, Bld: 94 mg/dL (ref 70–99)
Potassium: 3.7 mmol/L (ref 3.5–5.1)
Sodium: 140 mmol/L (ref 135–145)
Total Bilirubin: 0.5 mg/dL (ref 0.3–1.2)
Total Protein: 6.2 g/dL — ABNORMAL LOW (ref 6.5–8.1)

## 2021-02-01 LAB — IRON AND IRON BINDING CAPACITY (CC-WL,HP ONLY)
Iron: 69 ug/dL (ref 28–170)
Saturation Ratios: 18 % (ref 10.4–31.8)
TIBC: 385 ug/dL (ref 250–450)
UIBC: 316 ug/dL (ref 148–442)

## 2021-02-01 LAB — RETICULOCYTES
Immature Retic Fract: 17.4 % — ABNORMAL HIGH (ref 2.3–15.9)
RBC.: 3.89 MIL/uL (ref 3.87–5.11)
Retic Count, Absolute: 93.4 10*3/uL (ref 19.0–186.0)
Retic Ct Pct: 2.4 % (ref 0.4–3.1)

## 2021-02-01 LAB — LACTATE DEHYDROGENASE: LDH: 178 U/L (ref 98–192)

## 2021-02-01 LAB — FERRITIN: Ferritin: 2225 ng/mL — ABNORMAL HIGH (ref 11–307)

## 2021-02-01 MED ORDER — SODIUM CHLORIDE 0.9 % IV SOLN
10.0000 mg/kg | Freq: Once | INTRAVENOUS | Status: AC
  Administered 2021-02-01: 800 mg via INTRAVENOUS
  Filled 2021-02-01: qty 15

## 2021-02-01 MED ORDER — DIPHENHYDRAMINE HCL 50 MG/ML IJ SOLN
50.0000 mg | Freq: Once | INTRAMUSCULAR | Status: AC
  Administered 2021-02-01: 50 mg via INTRAVENOUS
  Filled 2021-02-01: qty 1

## 2021-02-01 MED ORDER — PALONOSETRON HCL INJECTION 0.25 MG/5ML
0.2500 mg | Freq: Once | INTRAVENOUS | Status: AC
  Administered 2021-02-01: 0.25 mg via INTRAVENOUS
  Filled 2021-02-01: qty 5

## 2021-02-01 MED ORDER — HEPARIN SOD (PORK) LOCK FLUSH 100 UNIT/ML IV SOLN
500.0000 [IU] | Freq: Once | INTRAVENOUS | Status: AC | PRN
  Administered 2021-02-01: 500 [IU]

## 2021-02-01 MED ORDER — SODIUM CHLORIDE 0.9% FLUSH
10.0000 mL | INTRAVENOUS | Status: DC | PRN
  Administered 2021-02-01: 10 mL

## 2021-02-01 MED ORDER — VALACYCLOVIR HCL 1 G PO TABS: 500.0000 mg | ORAL_TABLET | Freq: Every day | ORAL | 12 refills | Status: DC

## 2021-02-01 MED ORDER — SODIUM CHLORIDE 0.9 % IV SOLN
20.0000 mg | Freq: Once | INTRAVENOUS | Status: AC
  Administered 2021-02-01: 20 mg via INTRAVENOUS
  Filled 2021-02-01: qty 20

## 2021-02-01 MED ORDER — DEXTROSE 5 % IV SOLN
56.0000 mg/m2 | Freq: Once | INTRAVENOUS | Status: AC
  Administered 2021-02-01: 110 mg via INTRAVENOUS
  Filled 2021-02-01: qty 30

## 2021-02-01 MED ORDER — SODIUM CHLORIDE 0.9 % IV SOLN: Freq: Once | INTRAVENOUS | Status: AC

## 2021-02-01 MED ORDER — ACETAMINOPHEN 325 MG PO TABS
650.0000 mg | ORAL_TABLET | Freq: Once | ORAL | Status: AC
  Administered 2021-02-01: 650 mg via ORAL
  Filled 2021-02-01: qty 2

## 2021-02-01 MED ORDER — FAMOTIDINE 20 MG IN NS 100 ML IVPB
20.0000 mg | Freq: Two times a day (BID) | INTRAVENOUS | Status: DC
  Administered 2021-02-01: 20 mg via INTRAVENOUS
  Filled 2021-02-01: qty 100

## 2021-02-01 NOTE — Patient Instructions (Signed)
[facility] [place]  Discharge Instructions: Thank you for choosing [facility] to provide your oncology and hematology care.   If you have a lab appointment with the [facility], please go directly to the [facility] and check in at the registration area.  Wear comfortable clothing and clothing appropriate for easy access to any Portacath or PICC line.   We strive to give you quality time with your provider. You may need to reschedule your appointment if you arrive late (15 or more minutes).  Arriving late affects you and other patients whose appointments are after yours.  Also, if you miss three or more appointments without notifying the office, you may be dismissed from the clinic at the providers discretion.      For prescription refill requests, have your pharmacy contact our office and allow 72 hours for refills to be completed.    Today you received the following chemotherapy and/or immunotherapy agents Sarclisa/Kyprolis      To help prevent nausea and vomiting after your treatment, we encourage you to take your nausea medication as directed.  BELOW ARE SYMPTOMS THAT SHOULD BE REPORTED IMMEDIATELY: *FEVER GREATER THAN 100.4 F (38 C) OR HIGHER *CHILLS OR SWEATING *NAUSEA AND VOMITING THAT IS NOT CONTROLLED WITH YOUR NAUSEA MEDICATION *UNUSUAL SHORTNESS OF BREATH *UNUSUAL BRUISING OR BLEEDING *URINARY PROBLEMS (pain or burning when urinating, or frequent urination) *BOWEL PROBLEMS (unusual diarrhea, constipation, pain near the anus) TENDERNESS IN MOUTH AND THROAT WITH OR WITHOUT PRESENCE OF ULCERS (sore throat, sores in mouth, or a toothache) UNUSUAL RASH, SWELLING OR PAIN  UNUSUAL VAGINAL DISCHARGE OR ITCHING   Items with * indicate a potential emergency and should be followed up as soon as possible or go to the Emergency Department if any problems should occur.  Please show the CHEMOTHERAPY ALERT CARD or IMMUNOTHERAPY ALERT CARD at check-in  to the Emergency Department and triage nurse. Should you have questions after your visit or need to cancel or reschedule your appointment, please contact [facility]  [phone] and follow the prompts.  Office hours are 8:00 a.m. to 4:30 p.m. Monday - Friday. Please note that voicemails left after 4:00 p.m. may not be returned until the following business day.  We are closed weekends and major holidays. You have access to a nurse at all times for urgent questions. Please call the main number to the clinic [phone] and follow the prompts.  For any non-urgent questions, you may also contact your provider using MyChart. We now offer e-Visits for anyone 6 and older to request care online for non-urgent symptoms. For details visit [email or web address].   Also download the MyChart app! Go to the app store, search "MyChart", open the app, select , and log in with your MyChart username and password.  Due to Covid, a mask is required upon entering the hospital/clinic. If you do not have a mask, one will be given to you upon arrival. For doctor visits, patients may have 1 support person aged 15 or older with them. For treatment visits, patients cannot have anyone with them due to current Covid guidelines and our immunocompromised population.

## 2021-02-01 NOTE — Patient Instructions (Signed)

## 2021-02-01 NOTE — Progress Notes (Signed)
Hematology and Oncology Follow Up Visit  Emily Greer [id number] 12-28-1950 71 y.o. 02/01/2021   Principle Diagnosis:  Recurrent lambda light chain myeloma - nl cytogenetics History of recurrent endometrial carcinoma Iron deficiency anemia -blood loss   Past Therapy:             Status post second autologous stem cell transplant on 07/24/2014 Maintenance therapy with Pomalidomide/every 2 week Velcade - d/c'ed Radiation therapy for endometrial recurrence - completed 04/20/2015 Pomalyst/Kyprolis 70mg /m2 IV q 2 weeks - s/p cycle #12 - held on 12/26/2017 for hematuria Daratumumab/Pomalyst (1 mg) - status post cycle 19 -- d/c on 08/21/2019 Melflufen 40 mg IV q 4 weeks -- started on 08/27/2019, s/p cycle #2 --  D/c due to FDA removal Selinexor 60 mg po q week -- start on 01/19/2020 -- changed on 03/18/2020 -- d/c on 04/27/2020   Current Therapy:        Blenrep 2.5 mg/m2 IV q 3 weeks -- started on 05/20/2020, s/p cycle #7 --D/C secondary to progression on 12/22/2020 Carfilzomib/Sarclisa -- s/p cycle #1 -- start on 01/03/2021 Femara 2.5 mg po q day     Interim History:  Emily Greer is here today for follow-up.  Emily Greer had a wonderful Christmas and New Year's.  Her family came in.  Grandsons came in.  Emily Greer is little bit tired.  However, Emily Greer really enjoyed the day.  Emily Greer did develop shingles.  This was down in the rectal area.  This went from the rectum to the perineum.  Emily Greer is on full dose valacyclovir.  Emily Greer finishes her last dose today.  I will then put her on a maintenance dose of 500 mg a day.  Emily Greer has tolerated her treatment so far.  Her lambda light chain has come down.  It was down to 5.1 mg/dL on 24/40/1027.  Emily Greer has had ocular issues.  This may have been from the [facility].  Emily Greer has an appointment with the optometrist I think next Monday.  Hopefully if all looks good, should be able to have cataract surgery.  Emily Greer has had no fever.  Emily Greer has had no cough or shortness of breath.  There  is been no change in bowel or bladder habits.  Emily Greer has had no bleeding.  There is been a little bit of leg swelling.  Overall, I would say performance status is probably ECOG 1.     Medications:  Allergies as of 02/01/2021       Reactions   Codeine Nausea Only        Medication List        Accurate as of February 01, 2021  9:48 AM. If you have any questions, ask your nurse or doctor.          acyclovir ointment 5 % Commonly known as: Zovirax Apply thin layer to affected area two to three times daily prn   albuterol 108 (90 Base) MCG/ACT inhaler Commonly known as: VENTOLIN HFA Inhale 2 puffs into the lungs every 6 (six) hours as needed for wheezing or shortness of breath. 2 puffs 3 times daily x 5 days then every 6 hours as needed.   amLODipine 10 MG tablet Commonly known as: NORVASC TAKE 1 TABLET(10 MG) BY MOUTH EVERY MORNING   aspirin EC 81 MG tablet Take 81 mg by mouth daily.   CALCIUM 1200+D3 PO Take 1 tablet by mouth daily.   cetirizine 10 MG tablet Commonly known as: ZYRTEC Take 1 tablet (10 mg total) by mouth at  bedtime.   chlorhexidine 0.12 % solution Commonly known as: PERIDEX SMARTSIG:By Mouth   Fluocinolone Acetonide Scalp 0.01 % Oil Apply topically.   hydrochlorothiazide 12.5 MG capsule Commonly known as: MICROZIDE Take 12.5 mg by mouth every morning.   letrozole 2.5 MG tablet Commonly known as: FEMARA Take 1 tablet (2.5 mg total) by mouth daily.   lidocaine-prilocaine cream Commonly known as: EMLA Apply 1 application topically as needed.   loratadine 10 MG tablet Commonly known as: CLARITIN Take 10 mg by mouth every morning. Reported on 08/19/2015   metoprolol succinate 50 MG 24 hr tablet Commonly known as: TOPROL-XL TAKE 1 TABLET(50 MG) BY MOUTH EVERY MORNING   montelukast 10 MG tablet Commonly known as: SINGULAIR TAKE 1 TABLET(10 MG) BY MOUTH AT BEDTIME   nitrofurantoin (macrocrystal-monohydrate) 100 MG capsule Commonly known as:  Macrobid Take 1 capsule (100 mg total) by mouth 2 (two) times daily for 5 days.   ondansetron 8 MG tablet Commonly known as: Zofran Take 1 tablet (8 mg total) by mouth 2 (two) times daily as needed (Nausea or vomiting).   potassium chloride SA 20 MEQ tablet Commonly known as: KLOR-CON M TAKE 1 TABLET(20 MEQ) BY MOUTH TWICE DAILY   PROBIOTIC DAILY PO Take by mouth daily.   triamcinolone 55 MCG/ACT Aero nasal inhaler Commonly known as: NASACORT Place 2 sprays into the nose as needed.   triamterene-hydrochlorothiazide 37.5-25 MG tablet Commonly known as: MAXZIDE-25 1 tablet by mouth at bedtime   valACYclovir 1000 MG tablet Commonly known as: VALTREX Take 1 tablet (1,000 mg total) by mouth 3 (three) times daily.   Vitamin D3 50 MCG (2000 UT) Tabs Take 2 tablets by mouth daily.   Xiidra 5 % Soln Generic drug: Lifitegrast   Zinc 50 MG Tabs Take 50 mg by mouth daily.   zinc gluconate 50 MG tablet Take 50 mg by mouth daily.        Allergies:  Allergies  Allergen Reactions   Codeine Nausea Only    Past Medical History, Surgical history, Social history, and Family History were reviewed and updated.  Review of Systems: Review of Systems  Constitutional: Negative.   HENT: Negative.    Eyes:  Positive for blurred vision.  Respiratory: Negative.    Cardiovascular: Negative.   Gastrointestinal: Negative.   Genitourinary: Negative.   Musculoskeletal: Negative.   Skin: Negative.   Neurological: Negative.   Endo/Heme/Allergies: Negative.   Psychiatric/Behavioral: Negative.      Physical Exam:  weight is 171 lb (77.6 kg). Her oral temperature is 99.4 F (37.4 C). Her blood pressure is 114/71 and her pulse is 99. Her respiration is 16 and oxygen saturation is 100%.   Wt Readings from Last 3 Encounters:  02/01/21 171 lb (77.6 kg)  01/20/21 175 lb 9.6 oz (79.7 kg)  01/12/21 175 lb (79.4 kg)    Physical Exam Vitals reviewed.  HENT:     Head: Normocephalic and  atraumatic.  Eyes:     Pupils: Pupils are equal, round, and reactive to light.  Cardiovascular:     Rate and Rhythm: Normal rate and regular rhythm.     Heart sounds: Normal heart sounds.  Pulmonary:     Effort: Pulmonary effort is normal.     Breath sounds: Normal breath sounds.  Abdominal:     General: Bowel sounds are normal.     Palpations: Abdomen is soft.  Musculoskeletal:        General: No tenderness or deformity. Normal range of motion.  Cervical back: Normal range of motion.  Lymphadenopathy:     Cervical: No cervical adenopathy.  Skin:    General: Skin is warm and dry.     Findings: No erythema or rash.  Neurological:     Mental Status: Emily Greer is alert and oriented to person, place, and time.  Psychiatric:        Behavior: Behavior normal.        Thought Content: Thought content normal.        Judgment: Judgment normal.     Lab Results  Component Value Date   WBC 4.4 02/01/2021   HGB 10.1 (L) 02/01/2021   HCT 31.3 (L) 02/01/2021   MCV 80.1 02/01/2021   PLT 149 (L) 02/01/2021   Lab Results  Component Value Date   FERRITIN 8,819 (H) 12/22/2020   IRON 55 12/22/2020   TIBC 345 12/22/2020   UIBC 290 12/22/2020   IRONPCTSAT 16 (L) 12/22/2020   Lab Results  Component Value Date   RETICCTPCT 2.4 02/01/2021   RBC 3.91 02/01/2021   RBC 3.89 02/01/2021   Lab Results  Component Value Date   KPAFRELGTCHN 1.0 (L) 01/18/2021   LAMBDASER 51.0 (H) 01/18/2021   KAPLAMBRATIO 0.02 (L) 01/18/2021   Lab Results  Component Value Date   IGGSERUM 225 (L) 01/18/2021   IGGSERUM REFERT 01/18/2021   IGA <5 (L) 01/18/2021   IGA REFERT 01/18/2021   IGMSERUM <5 (L) 01/18/2021   IGMSERUM REFERT 01/18/2021   Lab Results  Component Value Date   TOTALPROTELP 5.8 (L) 01/18/2021   ALBUMINELP 3.3 01/18/2021   A1GS 0.3 01/18/2021   A2GS 0.9 01/18/2021   BETS 1.0 01/18/2021   BETA2SER 0.4 11/23/2014   GAMS 0.3 (L) 01/18/2021   MSPIKE 0.1 (H) 01/18/2021   SPEI Comment  10/19/2020     Chemistry      Component Value Date/Time   NA 140 02/01/2021 0905   NA 141 01/10/2017 1115   NA 140 06/21/2016 0918   K 3.7 02/01/2021 0905   K 4.0 01/10/2017 1115   K 4.3 06/21/2016 0918   CL 104 02/01/2021 0905   CL 106 01/10/2017 1115   CO2 27 02/01/2021 0905   CO2 27 01/10/2017 1115   CO2 20 (L) 06/21/2016 0918   BUN 20 02/01/2021 0905   BUN 15 01/10/2017 1115   BUN 15.8 06/21/2016 0918   CREATININE 0.89 02/01/2021 0905   CREATININE 1.0 01/10/2017 1115   CREATININE 0.8 06/21/2016 0918      Component Value Date/Time   CALCIUM 9.6 02/01/2021 0905   CALCIUM 9.5 01/10/2017 1115   CALCIUM 9.4 06/21/2016 0918   ALKPHOS 50 02/01/2021 0905   ALKPHOS 40 01/10/2017 1115   ALKPHOS 66 06/21/2016 0918   AST 15 02/01/2021 0905   AST 17 06/21/2016 0918   ALT 20 02/01/2021 0905   ALT 19 01/10/2017 1115   ALT 37 06/21/2016 0918   BILITOT 0.5 02/01/2021 0905   BILITOT 0.32 06/21/2016 0918       Impression and Plan: Ms. Petska is a very pleasant 71 yo African American female with recurrent lambda light chain myeloma.  Emily Greer had her second stem cell transplant for light chain myeloma was in June 2016.   We now have her on the Sarclisa/carfilzomib protocol.  So far, Emily Greer is responding.  I am happy that Emily Greer is responding.  Hopefully Emily Greer will have a prolonged response.  At some point, Emily Greer will then be treated out at [facility] with a new agent-teclistamab.  I really hope that the ocular issues are going to be taken care of.  Emily Greer really needs to have cataract surgery.  We will proceed with her second cycle of treatment today.  I will see her back the end of January for the start of her third cycle.    Josph Macho, MD 1/3/20239:48 AM

## 2021-02-02 LAB — KAPPA/LAMBDA LIGHT CHAINS
Kappa free light chain: 1 mg/L — ABNORMAL LOW (ref 3.3–19.4)
Kappa, lambda light chain ratio: 0.02 — ABNORMAL LOW (ref 0.26–1.65)
Lambda free light chains: 60.9 mg/L — ABNORMAL HIGH (ref 5.7–26.3)

## 2021-02-02 LAB — IGG, IGA, IGM
IgA: 7 mg/dL — ABNORMAL LOW (ref 87–352)
IgG (Immunoglobin G), Serum: 277 mg/dL — ABNORMAL LOW (ref 586–1602)
IgM (Immunoglobulin M), Srm: 6 mg/dL — ABNORMAL LOW (ref 26–217)

## 2021-02-07 ENCOUNTER — Other Ambulatory Visit: Payer: Self-pay | Admitting: *Deleted

## 2021-02-07 ENCOUNTER — Encounter: Payer: Self-pay | Admitting: Hematology & Oncology

## 2021-02-07 DIAGNOSIS — C9000 Multiple myeloma not having achieved remission: Secondary | ICD-10-CM

## 2021-02-07 LAB — PROTEIN ELECTROPHORESIS, SERUM, WITH REFLEX
A/G Ratio: 1.5 (ref 0.7–1.7)
Albumin ELP: 3.6 g/dL (ref 2.9–4.4)
Alpha-1-Globulin: 0.2 g/dL (ref 0.0–0.4)
Alpha-2-Globulin: 1.1 g/dL — ABNORMAL HIGH (ref 0.4–1.0)
Beta Globulin: 1 g/dL (ref 0.7–1.3)
Gamma Globulin: 0.2 g/dL — ABNORMAL LOW (ref 0.4–1.8)
Globulin, Total: 2.4 g/dL (ref 2.2–3.9)
M-Spike, %: 0.1 g/dL — ABNORMAL HIGH
SPEP Interpretation: 0
Total Protein ELP: 6 g/dL (ref 6.0–8.5)

## 2021-02-07 LAB — IMMUNOFIXATION REFLEX, SERUM
IgA: 5 mg/dL — ABNORMAL LOW (ref 87–352)
IgG (Immunoglobin G), Serum: 259 mg/dL — ABNORMAL LOW (ref 586–1602)
IgM (Immunoglobulin M), Srm: 7 mg/dL — ABNORMAL LOW (ref 26–217)

## 2021-02-08 ENCOUNTER — Inpatient Hospital Stay: Payer: Medicare PPO

## 2021-02-08 ENCOUNTER — Other Ambulatory Visit: Payer: Self-pay | Admitting: Hematology & Oncology

## 2021-02-08 ENCOUNTER — Other Ambulatory Visit: Payer: Self-pay

## 2021-02-08 VITALS — BP 156/73 | HR 61 | Temp 98.7°F | Resp 17

## 2021-02-08 DIAGNOSIS — C9000 Multiple myeloma not having achieved remission: Secondary | ICD-10-CM

## 2021-02-08 DIAGNOSIS — Z5112 Encounter for antineoplastic immunotherapy: Secondary | ICD-10-CM | POA: Diagnosis not present

## 2021-02-08 LAB — CBC WITH DIFFERENTIAL (CANCER CENTER ONLY)
Abs Immature Granulocytes: 0.05 10*3/uL (ref 0.00–0.07)
Basophils Absolute: 0 10*3/uL (ref 0.0–0.1)
Basophils Relative: 0 %
Eosinophils Absolute: 0.1 10*3/uL (ref 0.0–0.5)
Eosinophils Relative: 2 %
HCT: 28.5 % — ABNORMAL LOW (ref 36.0–46.0)
Hemoglobin: 9.3 g/dL — ABNORMAL LOW (ref 12.0–15.0)
Immature Granulocytes: 1 %
Lymphocytes Relative: 11 %
Lymphs Abs: 0.6 10*3/uL — ABNORMAL LOW (ref 0.7–4.0)
MCH: 26.5 pg (ref 26.0–34.0)
MCHC: 32.6 g/dL (ref 30.0–36.0)
MCV: 81.2 fL (ref 80.0–100.0)
Monocytes Absolute: 0.7 10*3/uL (ref 0.1–1.0)
Monocytes Relative: 13 %
Neutro Abs: 3.7 10*3/uL (ref 1.7–7.7)
Neutrophils Relative %: 73 %
Platelet Count: 81 10*3/uL — ABNORMAL LOW (ref 150–400)
RBC: 3.51 MIL/uL — ABNORMAL LOW (ref 3.87–5.11)
RDW: 19.7 % — ABNORMAL HIGH (ref 11.5–15.5)
WBC Count: 5.1 10*3/uL (ref 4.0–10.5)
nRBC: 0 % (ref 0.0–0.2)

## 2021-02-08 LAB — COMPREHENSIVE METABOLIC PANEL
ALT: 14 U/L (ref 0–44)
AST: 12 U/L — ABNORMAL LOW (ref 15–41)
Albumin: 3.9 g/dL (ref 3.5–5.0)
Alkaline Phosphatase: 49 U/L (ref 38–126)
Anion gap: 9 (ref 5–15)
BUN: 15 mg/dL (ref 8–23)
CO2: 27 mmol/L (ref 22–32)
Calcium: 9.5 mg/dL (ref 8.9–10.3)
Chloride: 106 mmol/L (ref 98–111)
Creatinine, Ser: 0.87 mg/dL (ref 0.44–1.00)
GFR, Estimated: >60 mL/min (ref >60)
Glucose, Bld: 87 mg/dL (ref 70–99)
Potassium: 3.5 mmol/L (ref 3.5–5.1)
Sodium: 142 mmol/L (ref 135–145)
Total Bilirubin: 0.5 mg/dL (ref 0.3–1.2)
Total Protein: 5.7 g/dL — ABNORMAL LOW (ref 6.5–8.1)

## 2021-02-08 MED ORDER — SODIUM CHLORIDE 0.9 % IV SOLN: Freq: Once | INTRAVENOUS | Status: AC

## 2021-02-08 MED ORDER — FAMOTIDINE 20 MG IN NS 100 ML IVPB
20.0000 mg | Freq: Two times a day (BID) | INTRAVENOUS | Status: DC
  Administered 2021-02-08: 20 mg via INTRAVENOUS
  Filled 2021-02-08: qty 20

## 2021-02-08 MED ORDER — HEPARIN SOD (PORK) LOCK FLUSH 100 UNIT/ML IV SOLN
500.0000 [IU] | Freq: Once | INTRAVENOUS | Status: AC | PRN
  Administered 2021-02-08: 500 [IU]

## 2021-02-08 MED ORDER — PALONOSETRON HCL INJECTION 0.25 MG/5ML
0.2500 mg | Freq: Once | INTRAVENOUS | Status: AC
  Administered 2021-02-08: 0.25 mg via INTRAVENOUS
  Filled 2021-02-08: qty 5

## 2021-02-08 MED ORDER — SODIUM CHLORIDE 0.9% FLUSH
10.0000 mL | INTRAVENOUS | Status: DC | PRN
  Administered 2021-02-08: 10 mL

## 2021-02-08 MED ORDER — SODIUM CHLORIDE 0.9 % IV SOLN: Freq: Once | INTRAVENOUS | Status: DC

## 2021-02-08 MED ORDER — HEPARIN SOD (PORK) LOCK FLUSH 100 UNIT/ML IV SOLN: 500.0000 [IU] | Freq: Once | INTRAVENOUS | Status: DC

## 2021-02-08 MED ORDER — SODIUM CHLORIDE 0.9 % IV SOLN
20.0000 mg | Freq: Once | INTRAVENOUS | Status: AC
  Administered 2021-02-08: 20 mg via INTRAVENOUS
  Filled 2021-02-08: qty 20

## 2021-02-08 MED ORDER — DEXTROSE 5 % IV SOLN
56.0000 mg/m2 | Freq: Once | INTRAVENOUS | Status: AC
  Administered 2021-02-08: 110 mg via INTRAVENOUS
  Filled 2021-02-08: qty 30

## 2021-02-08 MED ORDER — LIDOCAINE 5 % EX PTCH: 1.0000 | MEDICATED_PATCH | CUTANEOUS | 3 refills | Status: DC

## 2021-02-08 MED ORDER — SODIUM CHLORIDE 0.9% FLUSH: 10.0000 mL | Freq: Once | INTRAVENOUS | Status: DC

## 2021-02-08 NOTE — Patient Instructions (Signed)
[facility]  Discharge Instructions: Thank you for choosing [facility] to provide your oncology and hematology care.   If you have a lab appointment with the [facility], please go directly to the [facility] and check in at the registration area.  Wear comfortable clothing and clothing appropriate for easy access to any Portacath or PICC line.   We strive to give you quality time with your provider. You may need to reschedule your appointment if you arrive late (15 or more minutes).  Arriving late affects you and other patients whose appointments are after yours.  Also, if you miss three or more appointments without notifying the office, you may be dismissed from the clinic at the providers discretion.      For prescription refill requests, have your pharmacy contact our office and allow 72 hours for refills to be completed.    Today you received the following chemotherapy and/or immunotherapy agents Kryprolis      To help prevent nausea and vomiting after your treatment, we encourage you to take your nausea medication as directed.  BELOW ARE SYMPTOMS THAT SHOULD BE REPORTED IMMEDIATELY: *FEVER GREATER THAN 100.4 F (38 C) OR HIGHER *CHILLS OR SWEATING *NAUSEA AND VOMITING THAT IS NOT CONTROLLED WITH YOUR NAUSEA MEDICATION *UNUSUAL SHORTNESS OF BREATH *UNUSUAL BRUISING OR BLEEDING *URINARY PROBLEMS (pain or burning when urinating, or frequent urination) *BOWEL PROBLEMS (unusual diarrhea, constipation, pain near the anus) TENDERNESS IN MOUTH AND THROAT WITH OR WITHOUT PRESENCE OF ULCERS (sore throat, sores in mouth, or a toothache) UNUSUAL RASH, SWELLING OR PAIN  UNUSUAL VAGINAL DISCHARGE OR ITCHING   Items with * indicate a potential emergency and should be followed up as soon as possible or go to the Emergency Department if any problems should occur.  Please show the CHEMOTHERAPY ALERT CARD or IMMUNOTHERAPY ALERT CARD at check-in to the  Emergency Department and triage nurse. Should you have questions after your visit or need to cancel or reschedule your appointment, please contact [facility]  [phone] and follow the prompts.  Office hours are 8:00 a.m. to 4:30 p.m. Monday - Friday. Please note that voicemails left after 4:00 p.m. may not be returned until the following business day.  We are closed weekends and major holidays. You have access to a nurse at all times for urgent questions. Please call the main number to the clinic [phone] and follow the prompts.  For any non-urgent questions, you may also contact your provider using MyChart. We now offer e-Visits for anyone 69 and older to request care online for non-urgent symptoms. For details visit [email or web address].   Also download the MyChart app! Go to the app store, search "MyChart", open the app, select [facility], and log in with your MyChart username and password.  Due to Covid, a mask is required upon entering the hospital/clinic. If you do not have a mask, one will be given to you upon arrival. For doctor visits, patients may have 1 support person aged 69 or older with them. For treatment visits, patients cannot have anyone with them due to current Covid guidelines and our immunocompromised population.

## 2021-02-08 NOTE — Progress Notes (Signed)
CBC and CMET  reviewed by MD, ok to treat despite counts.

## 2021-02-14 ENCOUNTER — Other Ambulatory Visit: Payer: Self-pay

## 2021-02-14 ENCOUNTER — Telehealth: Payer: Medicare Other

## 2021-02-14 ENCOUNTER — Ambulatory Visit (INDEPENDENT_AMBULATORY_CARE_PROVIDER_SITE_OTHER): Payer: Medicare PPO

## 2021-02-14 DIAGNOSIS — C9000 Multiple myeloma not having achieved remission: Secondary | ICD-10-CM

## 2021-02-14 DIAGNOSIS — Z6830 Body mass index (BMI) 30.0-30.9, adult: Secondary | ICD-10-CM

## 2021-02-14 DIAGNOSIS — E6609 Other obesity due to excess calories: Secondary | ICD-10-CM

## 2021-02-14 DIAGNOSIS — I10 Essential (primary) hypertension: Secondary | ICD-10-CM

## 2021-02-15 ENCOUNTER — Inpatient Hospital Stay: Payer: Medicare PPO

## 2021-02-15 ENCOUNTER — Other Ambulatory Visit: Payer: Self-pay

## 2021-02-15 ENCOUNTER — Encounter (HOSPITAL_COMMUNITY): Payer: Self-pay | Admitting: Emergency Medicine

## 2021-02-15 VITALS — BP 130/63 | HR 66 | Temp 98.1°F | Resp 18

## 2021-02-15 DIAGNOSIS — Z5112 Encounter for antineoplastic immunotherapy: Secondary | ICD-10-CM | POA: Diagnosis not present

## 2021-02-15 DIAGNOSIS — C9000 Multiple myeloma not having achieved remission: Secondary | ICD-10-CM

## 2021-02-15 LAB — CMP (CANCER CENTER ONLY)
ALT: 14 U/L (ref 0–44)
AST: 13 U/L — ABNORMAL LOW (ref 15–41)
Albumin: 4 g/dL (ref 3.5–5.0)
Alkaline Phosphatase: 45 U/L (ref 38–126)
Anion gap: 8 (ref 5–15)
BUN: 21 mg/dL (ref 8–23)
CO2: 29 mmol/L (ref 22–32)
Calcium: 10 mg/dL (ref 8.9–10.3)
Chloride: 103 mmol/L (ref 98–111)
Creatinine: 0.9 mg/dL (ref 0.44–1.00)
GFR, Estimated: >60 mL/min (ref >60)
Glucose, Bld: 93 mg/dL (ref 70–99)
Potassium: 4 mmol/L (ref 3.5–5.1)
Sodium: 140 mmol/L (ref 135–145)
Total Bilirubin: 0.5 mg/dL (ref 0.3–1.2)
Total Protein: 6 g/dL — ABNORMAL LOW (ref 6.5–8.1)

## 2021-02-15 LAB — CBC WITH DIFFERENTIAL (CANCER CENTER ONLY)
Abs Immature Granulocytes: 0.05 10*3/uL (ref 0.00–0.07)
Basophils Absolute: 0 10*3/uL (ref 0.0–0.1)
Basophils Relative: 1 %
Eosinophils Absolute: 0.1 10*3/uL (ref 0.0–0.5)
Eosinophils Relative: 2 %
HCT: 29 % — ABNORMAL LOW (ref 36.0–46.0)
Hemoglobin: 9.4 g/dL — ABNORMAL LOW (ref 12.0–15.0)
Immature Granulocytes: 1 %
Lymphocytes Relative: 11 %
Lymphs Abs: 0.5 10*3/uL — ABNORMAL LOW (ref 0.7–4.0)
MCH: 26.5 pg (ref 26.0–34.0)
MCHC: 32.4 g/dL (ref 30.0–36.0)
MCV: 81.7 fL (ref 80.0–100.0)
Monocytes Absolute: 0.7 10*3/uL (ref 0.1–1.0)
Monocytes Relative: 15 %
Neutro Abs: 3.1 10*3/uL (ref 1.7–7.7)
Neutrophils Relative %: 70 %
Platelet Count: 77 10*3/uL — ABNORMAL LOW (ref 150–400)
RBC: 3.55 MIL/uL — ABNORMAL LOW (ref 3.87–5.11)
RDW: 20.9 % — ABNORMAL HIGH (ref 11.5–15.5)
WBC Count: 4.4 10*3/uL (ref 4.0–10.5)
nRBC: 0.5 % — ABNORMAL HIGH (ref 0.0–0.2)

## 2021-02-15 MED ORDER — ACETAMINOPHEN 325 MG PO TABS
650.0000 mg | ORAL_TABLET | Freq: Once | ORAL | Status: AC
  Administered 2021-02-15: 650 mg via ORAL
  Filled 2021-02-15: qty 2

## 2021-02-15 MED ORDER — SODIUM CHLORIDE 0.9 % IV SOLN
20.0000 mg | Freq: Once | INTRAVENOUS | Status: AC
  Administered 2021-02-15: 20 mg via INTRAVENOUS
  Filled 2021-02-15: qty 20

## 2021-02-15 MED ORDER — HEPARIN SOD (PORK) LOCK FLUSH 100 UNIT/ML IV SOLN
500.0000 [IU] | Freq: Once | INTRAVENOUS | Status: AC | PRN
  Administered 2021-02-15: 500 [IU]

## 2021-02-15 MED ORDER — DIPHENHYDRAMINE HCL 50 MG/ML IJ SOLN
50.0000 mg | Freq: Once | INTRAMUSCULAR | Status: AC
  Administered 2021-02-15: 50 mg via INTRAVENOUS
  Filled 2021-02-15: qty 1

## 2021-02-15 MED ORDER — SODIUM CHLORIDE 0.9% FLUSH: 3.0000 mL | INTRAVENOUS | Status: DC | PRN

## 2021-02-15 MED ORDER — SODIUM CHLORIDE 0.9% FLUSH: 10.0000 mL | INTRAVENOUS | Status: DC | PRN

## 2021-02-15 MED ORDER — SODIUM CHLORIDE 0.9 % IV SOLN: Freq: Once | INTRAVENOUS | Status: AC

## 2021-02-15 MED ORDER — SODIUM CHLORIDE 0.9 % IV SOLN
10.0000 mg/kg | Freq: Once | INTRAVENOUS | Status: AC
  Administered 2021-02-15: 800 mg via INTRAVENOUS
  Filled 2021-02-15: qty 25

## 2021-02-15 MED ORDER — FAMOTIDINE 20 MG IN NS 100 ML IVPB
20.0000 mg | Freq: Two times a day (BID) | INTRAVENOUS | Status: DC
  Administered 2021-02-15: 20 mg via INTRAVENOUS
  Filled 2021-02-15: qty 20

## 2021-02-15 MED ORDER — PALONOSETRON HCL INJECTION 0.25 MG/5ML
0.2500 mg | Freq: Once | INTRAVENOUS | Status: AC
  Administered 2021-02-15: 0.25 mg via INTRAVENOUS
  Filled 2021-02-15: qty 5

## 2021-02-15 MED ORDER — DEXTROSE 5 % IV SOLN: 56.0000 mg/m2 | Freq: Once | INTRAVENOUS | Status: DC

## 2021-02-15 MED ORDER — SODIUM CHLORIDE 0.9% FLUSH
10.0000 mL | INTRAVENOUS | Status: DC | PRN
  Administered 2021-02-15 (×2): 10 mL

## 2021-02-15 MED ORDER — HEPARIN SOD (PORK) LOCK FLUSH 100 UNIT/ML IV SOLN
500.0000 [IU] | Freq: Once | INTRAVENOUS | Status: AC
  Administered 2021-02-15: 500 [IU] via INTRAVENOUS

## 2021-02-15 MED ORDER — SODIUM CHLORIDE 0.9 % IV SOLN: Freq: Once | INTRAVENOUS | Status: DC

## 2021-02-15 NOTE — Patient Instructions (Signed)
[facility]  Discharge Instructions: Thank you for choosing [facility] to provide your oncology and hematology care.   If you have a lab appointment with the [facility], please go directly to the [facility] and check in at the registration area.  Wear comfortable clothing and clothing appropriate for easy access to any Portacath or PICC line.   We strive to give you quality time with your provider. You may need to reschedule your appointment if you arrive late (15 or more minutes).  Arriving late affects you and other patients whose appointments are after yours.  Also, if you miss three or more appointments without notifying the office, you may be dismissed from the clinic at the providers discretion.      For prescription refill requests, have your pharmacy contact our office and allow 72 hours for refills to be completed.    Today you received the following chemotherapy and/or immunotherapy agents sarclisa       To help prevent nausea and vomiting after your treatment, we encourage you to take your nausea medication as directed.  BELOW ARE SYMPTOMS THAT SHOULD BE REPORTED IMMEDIATELY: *FEVER GREATER THAN 100.4 F (38 C) OR HIGHER *CHILLS OR SWEATING *NAUSEA AND VOMITING THAT IS NOT CONTROLLED WITH YOUR NAUSEA MEDICATION *UNUSUAL SHORTNESS OF BREATH *UNUSUAL BRUISING OR BLEEDING *URINARY PROBLEMS (pain or burning when urinating, or frequent urination) *BOWEL PROBLEMS (unusual diarrhea, constipation, pain near the anus) TENDERNESS IN MOUTH AND THROAT WITH OR WITHOUT PRESENCE OF ULCERS (sore throat, sores in mouth, or a toothache) UNUSUAL RASH, SWELLING OR PAIN  UNUSUAL VAGINAL DISCHARGE OR ITCHING   Items with * indicate a potential emergency and should be followed up as soon as possible or go to the Emergency Department if any problems should occur.  Please show the CHEMOTHERAPY ALERT CARD or IMMUNOTHERAPY ALERT CARD at check-in to the  Emergency Department and triage nurse. Should you have questions after your visit or need to cancel or reschedule your appointment, please contact [facility]  [phone] and follow the prompts.  Office hours are 8:00 a.m. to 4:30 p.m. Monday - Friday. Please note that voicemails left after 4:00 p.m. may not be returned until the following business day.  We are closed weekends and major holidays. You have access to a nurse at all times for urgent questions. Please call the main number to the clinic [phone] and follow the prompts.  For any non-urgent questions, you may also contact your provider using MyChart. We now offer e-Visits for anyone 54 and older to request care online for non-urgent symptoms. For details visit [email or web address].   Also download the MyChart app! Go to the app store, search "MyChart", open the app, select [facility], and log in with your MyChart username and password.  Due to Covid, a mask is required upon entering the hospital/clinic. If you do not have a mask, one will be given to you upon arrival. For doctor visits, patients may have 1 support person aged 10 or older with them. For treatment visits, patients cannot have anyone with them due to current Covid guidelines and our immunocompromised population.

## 2021-02-15 NOTE — Chronic Care Management (AMB) (Signed)
Chronic Care Management   CCM RN Visit Note  02/14/2021 Name: Emily Greer MRN: [id number] DOB: [birth year]  Subjective: Emily Greer is a 71 y.o. year old female who is a primary care patient of [name], MD. The care management team was consulted for assistance with disease management and care coordination needs.    Engaged with patient by telephone for follow up visit in response to provider referral for case management and/or care coordination services.   Consent to Services:  The patient was given information about Chronic Care Management services, agreed to services, and gave verbal consent prior to initiation of services.  Please see initial visit note for detailed documentation.   Patient agreed to services and verbal consent obtained.   Assessment: Review of patient past medical history, allergies, medications, health status, including review of consultants reports, laboratory and other test data, was performed as part of comprehensive evaluation and provision of chronic care management services.   SDOH (Social Determinants of Health) assessments and interventions performed:  Yes, no acute needs   CCM Care Plan  Allergies  Allergen Reactions   Codeine Nausea Only    Outpatient Encounter Medications as of 02/14/2021  Medication Sig Note   acyclovir ointment (ZOVIRAX) 5 % Apply thin layer to affected area two to three times daily prn    albuterol (VENTOLIN HFA) 108 (90 Base) MCG/ACT inhaler Inhale 2 puffs into the lungs every 6 (six) hours as needed for wheezing or shortness of breath. 2 puffs 3 times daily x 5 days then every 6 hours as needed.    amLODipine (NORVASC) 10 MG tablet TAKE 1 TABLET(10 MG) BY MOUTH EVERY MORNING    aspirin EC 81 MG tablet Take 81 mg by mouth daily.    Calcium-Magnesium-Vitamin D (CALCIUM 1200+D3 PO) Take 1 tablet by mouth daily.    cetirizine (ZYRTEC) 10 MG tablet Take 1 tablet (10 mg total) by mouth at bedtime.    chlorhexidine  (PERIDEX) 0.12 % solution SMARTSIG:By Mouth    Cholecalciferol (VITAMIN D3) 2000 UNITS TABS Take 2 tablets by mouth daily. 05/20/2020: Patient stated she is taking this twice a day.   Fluocinolone Acetonide Scalp 0.01 % OIL Apply topically.    hydrochlorothiazide (MICROZIDE) 12.5 MG capsule Take 12.5 mg by mouth every morning.    letrozole (FEMARA) 2.5 MG tablet Take 1 tablet (2.5 mg total) by mouth daily.    lidocaine (LIDODERM) 5 % Place 1 patch onto the skin daily. Remove & Discard patch within 12 hours or as directed by MD    lidocaine-prilocaine (EMLA) cream Apply 1 application topically as needed.    loratadine (CLARITIN) 10 MG tablet Take 10 mg by mouth every morning. Reported on 08/19/2015    metoprolol succinate (TOPROL-XL) 50 MG 24 hr tablet TAKE 1 TABLET(50 MG) BY MOUTH EVERY MORNING    montelukast (SINGULAIR) 10 MG tablet TAKE 1 TABLET(10 MG) BY MOUTH AT BEDTIME    ondansetron (ZOFRAN) 8 MG tablet Take 1 tablet (8 mg total) by mouth 2 (two) times daily as needed (Nausea or vomiting).    potassium chloride SA (KLOR-CON) 20 MEQ tablet TAKE 1 TABLET(20 MEQ) BY MOUTH TWICE DAILY    Probiotic Product (PROBIOTIC DAILY PO) Take by mouth daily.    triamcinolone (NASACORT) 55 MCG/ACT AERO nasal inhaler Place 2 sprays into the nose as needed.     triamterene-hydrochlorothiazide (MAXZIDE-25) 37.5-25 MG tablet 1 tablet by mouth at bedtime    valACYclovir (VALTREX) 1000 MG tablet TAKE 1 TABLET(1000  MG) BY MOUTH THREE TIMES DAILY    XIIDRA 5 % SOLN     Zinc 50 MG TABS Take 50 mg by mouth daily.    zinc gluconate 50 MG tablet Take 50 mg by mouth daily.    Facility-Administered Encounter Medications as of 02/14/2021  Medication   0.9 %  sodium chloride infusion   albuterol (PROVENTIL) (2.5 MG/3ML) 0.083% nebulizer solution 2.5 mg   sodium chloride flush (NS) 0.9 % injection 10 mL   sodium chloride flush (NS) 0.9 % injection 10 mL   sodium chloride flush (NS) 0.9 % injection 10 mL   sodium  chloride flush (NS) 0.9 % injection 10 mL   sodium chloride flush (NS) 0.9 % injection 10 mL    Patient Active Problem List   Diagnosis Date Noted   Mastalgia in female 12/29/2020   Class 1 obesity due to excess calories with serious comorbidity and body mass index (BMI) of 30.0 to 30.9 in adult 12/29/2020   Goals of care, counseling/discussion 08/21/2019   Osteonecrosis, unspecified (HCC) 03/31/2019   VAIN I (vaginal intraepithelial neoplasia grade I) 10/10/2018   Influenza B 02/18/2018   Flu-like symptoms 02/18/2018   Excessive cerumen in both ear canals 02/18/2018   Abnormal Pap smear of vagina 12/30/2015   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) 01/29/2015   Iron deficiency anemia due to chronic blood loss    HCAP (healthcare-associated pneumonia) 12/29/2014   UTI (lower urinary tract infection) 12/29/2014   Hypokalemia 12/29/2014   CAP (community acquired pneumonia) 12/29/2014   Bone marrow transplant status (HCC) 07/25/2014   S/P autologous bone marrow transplantation (HCC) 07/25/2014   Fever 05/31/2014   ARF (acute renal failure) (HCC) 05/31/2014   Anemia 05/31/2014   Renal failure    Examination of participant in clinical trial 05/19/2014   Encounter for examination for normal comparison or control in clinical research program 05/19/2014   Kahler disease (HCC) 05/05/2014   Multiple myeloma in remission (HCC) 05/05/2014   History of radiation therapy    Endometrial carcinoma (HCC) 09/28/2011   Pregnancy induced hypertension    Elevated hemoglobin A1c    Myeloma (HCC) 01/17/2011   Lambda light chain myeloma (HCC) 11/11/2007   Vaginal atrophy 12/19/2006   Increased BMI 12/18/2005   Hypertension 11/28/1997   H/O multiple myeloma 11/28/1997   Staphylococcus aureus bacteremia 11/28/1997   Staphylococcus epidermidis bacteremia 11/28/1997   Essential (primary) hypertension 11/28/1997    Conditions to be addressed/monitored: Essential  Hypertension, Class 1 Obesity, Multiple Myeloma  Care Plan : RN Care Manager Plan of Care  Updates made by Riley Churches, RN since 02/14/2021 12:00 AM     Problem: No Plan of Care established for management of chronic disease states (Essential Hypertension, Class 1 Obesity, Multiple Myeloma)   Priority: High     Long-Range Goal: Establishment of Plan of Care for management of chronic disease states (Essential Hypertension, Class 1 Obesity, Multiple Myeloma)   Start Date: 02/14/2021  Expected End Date: 02/14/2022  This Visit's Progress: On track  Priority: High  Note:   Current Barriers:  Knowledge Deficits related to plan of care for management of Essential Hypertension, Class 1 Obesity, Multiple Myeloma  Chronic Disease Management support and education needs related to Essential Hypertension, Class 1 Obesity, Multiple Myeloma   RNCM Clinical Goal(s):  Patient will verbalize basic understanding of  Essential Hypertension, Class 1 Obesity, Multiple Myeloma disease process and self health management plan as evidenced by patient will report  having no disease exacerbations related to her chronic disease states as listed above  take all medications exactly as prescribed and will call provider for medication related questions as evidenced by patient will report having no missed doses of her prescribed medications  demonstrate Ongoing health management independence as evidenced by patient will report being 100% adherent to her prescribed treatment plan continue to work with RN Care Manager to address care management and care coordination needs related to  Essential Hypertension, Class 1 Obesity, Multiple Myeloma as evidenced by adherence to CM Team Scheduled appointments demonstrate ongoing self health care management ability   as evidenced by    through collaboration with RN Care manager, provider, and care team.   Interventions: 1:1 collaboration with primary care provider regarding development  and update of comprehensive plan of care as evidenced by provider attestation and co-signature Inter-disciplinary care team collaboration (see longitudinal plan of care) Evaluation of current treatment plan related to  self management and patient's adherence to plan as established by provider   Hypertension Interventions:  (Status:  Goal on track:  Yes.) Long Term Goal Last practice recorded BP readings:  BP Readings from Last 3 Encounters:  02/08/21 (!) 156/73  02/01/21 125/74  02/01/21 114/71  Most recent eGFR/CrCl:  Lab Results  Component Value Date   EGFR 86 (L) 06/21/2016    No components found for: CRCL  Evaluation of current treatment plan related to hypertension self management and patient's adherence to plan as established by provider Reviewed medications with patient and discussed importance of compliance Counseled on the importance of exercise goals with target of 150 minutes per week Advised patient, providing education and rationale, to monitor blood pressure daily and record, calling PCP for findings outside established parameters Provided education on prescribed diet low Sodium Discussed complications of poorly controlled blood pressure such as heart disease, stroke, circulatory complications, vision complications, kidney impairment, sexual dysfunction Discussed plans with patient for ongoing care management follow up and provided patient with direct contact information for care management team  Oncology:  (Status: Goal on track:  Yes.) Long Term Goal Assessment of understanding of oncology diagnosis:  Assessed patient understanding of cancer diagnosis and recommended treatment plan, Reviewed upcoming provider appointments and treatment appointments, Assessed available transportation to appointments and treatments. Has consistent/reliable transportation: Yes, Assessed support system. Has consistent/reliable family or other support: Yes, and Nutrition assessment  performed Discussed plans with patient for ongoing care management follow up and provided patient with direct contact information for care management team  Patient Goals/Self-Care Activities: Take all medications as prescribed Attend all scheduled provider appointments Call pharmacy for medication refills 3-7 days in advance of running out of medications Perform IADL's (shopping, preparing meals, housekeeping, managing finances) independently Call provider office for new concerns or questions  keep a blood pressure log take blood pressure log to all doctor appointments call doctor for signs and symptoms of high blood pressure take medications for blood pressure exactly as prescribed report new symptoms to your doctor  Follow Up Plan:  Telephone follow up appointment with care management team member scheduled for:  04/07/21      Plan:Telephone follow up appointment with care management team member scheduled for:  04/07/21  Delsa Sale, RN, BSN, CCM Care Management Coordinator [facility] Care Management/[facility]  Direct Phone: [phone]

## 2021-02-15 NOTE — Addendum Note (Signed)
Addended by: Melton Krebs on: 02/15/2021 03:30 PM   Modules accepted: Orders

## 2021-02-15 NOTE — Progress Notes (Signed)
Hold Kyprolis for low pltc today per Lottie Dawson, NP.

## 2021-02-15 NOTE — Progress Notes (Signed)
OK to treat with platelets value from today per Sarah Carter, NP. 

## 2021-02-15 NOTE — Patient Instructions (Signed)
Visit Information  Thank you for taking time to visit with me today. Please don't hesitate to contact me if I can be of assistance to you before our next scheduled telephone appointment.  Following are the goals we discussed today:  (Copy and paste patient goals from clinical care plan here)  Our next appointment is by telephone on 04/07/21 at 11:15 AM  Please call the care guide team at [phone] if you need to cancel or reschedule your appointment.   If you are experiencing a Mental Health or [place] or need someone to talk to, please call 1-800-273-TALK (toll free, 24 hour hotline)   Patient verbalizes understanding of instructions and care plan provided today and agrees to view in [place]. Active MyChart status confirmed with patient.    Barb Merino, RN, BSN, CCM Care Management Coordinator [facility]  Direct Phone: [phone]

## 2021-02-15 NOTE — Progress Notes (Signed)
Called [employer] to start the appeal process for lidocaine 5% patches.  Awaiting response.

## 2021-02-17 ENCOUNTER — Other Ambulatory Visit: Payer: Self-pay | Admitting: Pharmacist

## 2021-02-21 ENCOUNTER — Other Ambulatory Visit: Payer: Self-pay

## 2021-02-21 ENCOUNTER — Encounter (HOSPITAL_COMMUNITY): Payer: Self-pay | Admitting: Emergency Medicine

## 2021-02-21 ENCOUNTER — Telehealth: Payer: Self-pay

## 2021-02-21 MED ORDER — ALBUTEROL SULFATE HFA 108 (90 BASE) MCG/ACT IN AERS: 2.0000 | INHALATION_SPRAY | Freq: Four times a day (QID) | RESPIRATORY_TRACT | 11 refills | Status: AC | PRN

## 2021-02-21 NOTE — Progress Notes (Signed)
[employer] approved lidocaine 5% patches from 01/30/21-01/29/2022. Reference #[id number].

## 2021-02-21 NOTE — Telephone Encounter (Signed)
The patient was notified that Dr. Baird Cancer said to only take 1G valacyclovir one per day.

## 2021-02-22 ENCOUNTER — Telehealth: Payer: Self-pay

## 2021-02-22 NOTE — Telephone Encounter (Signed)
Confirmed with pt again. Take 1G of Valtrex per day. Pt & husband "Randall Hiss" aware.

## 2021-02-25 ENCOUNTER — Encounter: Payer: Self-pay | Admitting: *Deleted

## 2021-03-01 ENCOUNTER — Other Ambulatory Visit: Payer: Self-pay

## 2021-03-01 ENCOUNTER — Inpatient Hospital Stay: Payer: Medicare PPO

## 2021-03-01 ENCOUNTER — Encounter: Payer: Self-pay | Admitting: Hematology & Oncology

## 2021-03-01 ENCOUNTER — Inpatient Hospital Stay (HOSPITAL_BASED_OUTPATIENT_CLINIC_OR_DEPARTMENT_OTHER): Payer: Medicare PPO | Admitting: Hematology & Oncology

## 2021-03-01 VITALS — BP 168/85 | HR 88 | Temp 99.0°F | Resp 18 | Wt 180.2 lb

## 2021-03-01 DIAGNOSIS — I10 Essential (primary) hypertension: Secondary | ICD-10-CM

## 2021-03-01 DIAGNOSIS — C541 Malignant neoplasm of endometrium: Secondary | ICD-10-CM | POA: Diagnosis not present

## 2021-03-01 DIAGNOSIS — C9000 Multiple myeloma not having achieved remission: Secondary | ICD-10-CM

## 2021-03-01 DIAGNOSIS — Z5112 Encounter for antineoplastic immunotherapy: Secondary | ICD-10-CM | POA: Diagnosis not present

## 2021-03-01 DIAGNOSIS — C9001 Multiple myeloma in remission: Secondary | ICD-10-CM

## 2021-03-01 DIAGNOSIS — D500 Iron deficiency anemia secondary to blood loss (chronic): Secondary | ICD-10-CM | POA: Diagnosis not present

## 2021-03-01 LAB — CBC WITH DIFFERENTIAL (CANCER CENTER ONLY)
Abs Immature Granulocytes: 0.03 10*3/uL (ref 0.00–0.07)
Basophils Absolute: 0.1 10*3/uL (ref 0.0–0.1)
Basophils Relative: 1 %
Eosinophils Absolute: 0.1 10*3/uL (ref 0.0–0.5)
Eosinophils Relative: 1 %
HCT: 28.3 % — ABNORMAL LOW (ref 36.0–46.0)
Hemoglobin: 9.3 g/dL — ABNORMAL LOW (ref 12.0–15.0)
Immature Granulocytes: 1 %
Lymphocytes Relative: 10 %
Lymphs Abs: 0.6 10*3/uL — ABNORMAL LOW (ref 0.7–4.0)
MCH: 27.2 pg (ref 26.0–34.0)
MCHC: 32.9 g/dL (ref 30.0–36.0)
MCV: 82.7 fL (ref 80.0–100.0)
Monocytes Absolute: 0.5 10*3/uL (ref 0.1–1.0)
Monocytes Relative: 10 %
Neutro Abs: 4.1 10*3/uL (ref 1.7–7.7)
Neutrophils Relative %: 77 %
Platelet Count: 156 10*3/uL (ref 150–400)
RBC: 3.42 MIL/uL — ABNORMAL LOW (ref 3.87–5.11)
RDW: 21.8 % — ABNORMAL HIGH (ref 11.5–15.5)
WBC Count: 5.3 10*3/uL (ref 4.0–10.5)
nRBC: 0 % (ref 0.0–0.2)

## 2021-03-01 LAB — CMP (CANCER CENTER ONLY)
ALT: 15 U/L (ref 0–44)
AST: 18 U/L (ref 15–41)
Albumin: 3.7 g/dL (ref 3.5–5.0)
Alkaline Phosphatase: 46 U/L (ref 38–126)
Anion gap: 8 (ref 5–15)
BUN: 16 mg/dL (ref 8–23)
CO2: 25 mmol/L (ref 22–32)
Calcium: 8.8 mg/dL — ABNORMAL LOW (ref 8.9–10.3)
Chloride: 105 mmol/L (ref 98–111)
Creatinine: 0.79 mg/dL (ref 0.44–1.00)
GFR, Estimated: >60 mL/min (ref >60)
Glucose, Bld: 100 mg/dL — ABNORMAL HIGH (ref 70–99)
Potassium: 3.5 mmol/L (ref 3.5–5.1)
Sodium: 138 mmol/L (ref 135–145)
Total Bilirubin: 0.4 mg/dL (ref 0.3–1.2)
Total Protein: 6.4 g/dL — ABNORMAL LOW (ref 6.5–8.1)

## 2021-03-01 LAB — LACTATE DEHYDROGENASE: LDH: 206 U/L — ABNORMAL HIGH (ref 98–192)

## 2021-03-01 MED ORDER — DIPHENHYDRAMINE HCL 50 MG/ML IJ SOLN
50.0000 mg | Freq: Once | INTRAMUSCULAR | Status: AC
  Administered 2021-03-01: 50 mg via INTRAVENOUS
  Filled 2021-03-01: qty 1

## 2021-03-01 MED ORDER — DEXTROSE 5 % IV SOLN
56.0000 mg/m2 | Freq: Once | INTRAVENOUS | Status: AC
  Administered 2021-03-01: 110 mg via INTRAVENOUS
  Filled 2021-03-01: qty 30

## 2021-03-01 MED ORDER — FAMOTIDINE IN NACL 20-0.9 MG/50ML-% IV SOLN
20.0000 mg | Freq: Once | INTRAVENOUS | Status: AC
  Administered 2021-03-01: 20 mg via INTRAVENOUS
  Filled 2021-03-01: qty 50

## 2021-03-01 MED ORDER — SODIUM CHLORIDE 0.9 % IV SOLN: Freq: Once | INTRAVENOUS | Status: AC

## 2021-03-01 MED ORDER — SODIUM CHLORIDE 0.9 % IV SOLN
10.0000 mg/kg | Freq: Once | INTRAVENOUS | Status: AC
  Administered 2021-03-01: 800 mg via INTRAVENOUS
  Filled 2021-03-01: qty 15

## 2021-03-01 MED ORDER — SODIUM CHLORIDE 0.9 % IV SOLN
20.0000 mg | Freq: Once | INTRAVENOUS | Status: AC
  Administered 2021-03-01: 20 mg via INTRAVENOUS
  Filled 2021-03-01: qty 20

## 2021-03-01 MED ORDER — DENOSUMAB 120 MG/1.7ML ~~LOC~~ SOLN
120.0000 mg | Freq: Once | SUBCUTANEOUS | Status: AC
  Administered 2021-03-01: 120 mg via SUBCUTANEOUS
  Filled 2021-03-01: qty 1.7

## 2021-03-01 MED ORDER — ACETAMINOPHEN 325 MG PO TABS
650.0000 mg | ORAL_TABLET | Freq: Once | ORAL | Status: AC
  Administered 2021-03-01: 650 mg via ORAL
  Filled 2021-03-01: qty 2

## 2021-03-01 MED ORDER — PALONOSETRON HCL INJECTION 0.25 MG/5ML
0.2500 mg | Freq: Once | INTRAVENOUS | Status: AC
  Administered 2021-03-01: 0.25 mg via INTRAVENOUS
  Filled 2021-03-01: qty 5

## 2021-03-01 MED ORDER — SODIUM CHLORIDE 0.9% FLUSH
10.0000 mL | INTRAVENOUS | Status: DC | PRN
  Administered 2021-03-01: 10 mL

## 2021-03-01 MED ORDER — HEPARIN SOD (PORK) LOCK FLUSH 100 UNIT/ML IV SOLN
500.0000 [IU] | Freq: Once | INTRAVENOUS | Status: AC | PRN
  Administered 2021-03-01: 500 [IU]

## 2021-03-01 NOTE — Patient Instructions (Signed)
[facility] [place]  Discharge Instructions: Thank you for choosing [facility] to provide your oncology and hematology care.   If you have a lab appointment with the [facility], please go directly to the [facility] and check in at the registration area.  Wear comfortable clothing and clothing appropriate for easy access to any Portacath or PICC line.   We strive to give you quality time with your provider. You may need to reschedule your appointment if you arrive late (15 or more minutes).  Arriving late affects you and other patients whose appointments are after yours.  Also, if you miss three or more appointments without notifying the office, you may be dismissed from the clinic at the providers discretion.      For prescription refill requests, have your pharmacy contact our office and allow 72 hours for refills to be completed.    Today you received the following chemotherapy and/or immunotherapy agents kyprolis, sarclisa    To help prevent nausea and vomiting after your treatment, we encourage you to take your nausea medication as directed.  BELOW ARE SYMPTOMS THAT SHOULD BE REPORTED IMMEDIATELY: *FEVER GREATER THAN 100.4 F (38 C) OR HIGHER *CHILLS OR SWEATING *NAUSEA AND VOMITING THAT IS NOT CONTROLLED WITH YOUR NAUSEA MEDICATION *UNUSUAL SHORTNESS OF BREATH *UNUSUAL BRUISING OR BLEEDING *URINARY PROBLEMS (pain or burning when urinating, or frequent urination) *BOWEL PROBLEMS (unusual diarrhea, constipation, pain near the anus) TENDERNESS IN MOUTH AND THROAT WITH OR WITHOUT PRESENCE OF ULCERS (sore throat, sores in mouth, or a toothache) UNUSUAL RASH, SWELLING OR PAIN  UNUSUAL VAGINAL DISCHARGE OR ITCHING   Items with * indicate a potential emergency and should be followed up as soon as possible or go to the Emergency Department if any problems should occur.  Please show the CHEMOTHERAPY ALERT CARD or IMMUNOTHERAPY ALERT CARD at check-in  to the Emergency Department and triage nurse. Should you have questions after your visit or need to cancel or reschedule your appointment, please contact [facility]  [phone] and follow the prompts.  Office hours are 8:00 a.m. to 4:30 p.m. Monday - Friday. Please note that voicemails left after 4:00 p.m. may not be returned until the following business day.  We are closed weekends and major holidays. You have access to a nurse at all times for urgent questions. Please call the main number to the clinic [phone] and follow the prompts.  For any non-urgent questions, you may also contact your provider using MyChart. We now offer e-Visits for anyone 21 and older to request care online for non-urgent symptoms. For details visit [email or web address].   Also download the MyChart app! Go to the app store, search "MyChart", open the app, select [facility], and log in with your MyChart username and password.  Due to Covid, a mask is required upon entering the hospital/clinic. If you do not have a mask, one will be given to you upon arrival. For doctor visits, patients may have 1 support person aged 23 or older with them. For treatment visits, patients cannot have anyone with them due to current Covid guidelines and our immunocompromised population.

## 2021-03-01 NOTE — Progress Notes (Signed)
Hematology and Oncology Follow Up Visit  Emily Greer [id number] 1951/01/02 71 y.o. 03/01/2021   Principle Diagnosis:  Recurrent lambda light chain myeloma - nl cytogenetics History of recurrent endometrial carcinoma Iron deficiency anemia -blood loss   Past Therapy:             Status post second autologous stem cell transplant on 07/24/2014 Maintenance therapy with Pomalidomide/every 2 week Velcade - d/c'ed Radiation therapy for endometrial recurrence - completed 04/20/2015 Pomalyst/Kyprolis 70mg /m2 IV q 2 weeks - s/p cycle #12 - held on 12/26/2017 for hematuria Daratumumab/Pomalyst (1 mg) - status post cycle 19 -- d/c on 08/21/2019 Melflufen 40 mg IV q 4 weeks -- started on 08/27/2019, s/p cycle #2 --  D/c due to FDA removal Selinexor 60 mg po q week -- start on 01/19/2020 -- changed on 03/18/2020 -- d/c on 04/27/2020   Current Therapy:        Blenrep 2.5 mg/m2 IV q 3 weeks -- started on 05/20/2020, s/p cycle #7 --D/C secondary to progression on 12/22/2020 Carfilzomib/Sarclisa -- s/p cycle #1 -- start on 01/03/2021 Femara 2.5 mg po q day     Interim History:  Emily Greer is here today for follow-up.  So far, she is doing okay.  She has gotten over the shingles.  We did put her on a prophylactic dose of Valtrex.  She is also on I think we antibiotic/penicillin because of some oral issues.  She is post to have some fillings next week.  We last checked her lambda light chains they were up a little bit.  Lambda light chain was 6 mg/dL on 4/0/9811.  Para she still is having some ocular issues.  She has to use eyedrops hourly.  She is  Any eye surgery probably will not be until March, at the earliest.  There is no change in bowel or bladder habits.  She has had no rashes.  There has been a decrease in her leg swelling.  As for her family, they are doing okay.  Overall, her performance status is ECOG 1.   Medications:  Allergies as of 03/01/2021       Reactions    Codeine Nausea Only        Medication List        Accurate as of March 01, 2021 10:07 AM. If you have any questions, ask your nurse or doctor.          acyclovir ointment 5 % Commonly known as: Zovirax Apply thin layer to affected area two to three times daily prn   albuterol 108 (90 Base) MCG/ACT inhaler Commonly known as: VENTOLIN HFA Inhale 2 puffs into the lungs every 6 (six) hours as needed for wheezing or shortness of breath. 2 puffs 3 times daily x 5 days then every 6 hours as needed.   amLODipine 10 MG tablet Commonly known as: NORVASC TAKE 1 TABLET(10 MG) BY MOUTH EVERY MORNING   aspirin EC 81 MG tablet Take 81 mg by mouth daily.   CALCIUM 1200+D3 PO Take 1 tablet by mouth daily.   cetirizine 10 MG tablet Commonly known as: ZYRTEC Take 1 tablet (10 mg total) by mouth at bedtime.   chlorhexidine 0.12 % solution Commonly known as: PERIDEX SMARTSIG:By Mouth   Fluocinolone Acetonide Scalp 0.01 % Oil Apply topically.   hydrochlorothiazide 12.5 MG capsule Commonly known as: MICROZIDE Take 12.5 mg by mouth every morning.   letrozole 2.5 MG tablet Commonly known as: FEMARA Take 1 tablet (2.5 mg total)  by mouth daily.   lidocaine 5 % Commonly known as: LIDODERM Place 1 patch onto the skin daily. Remove & Discard patch within 12 hours or as directed by MD   lidocaine-prilocaine cream Commonly known as: EMLA Apply 1 application topically as needed.   loratadine 10 MG tablet Commonly known as: CLARITIN Take 10 mg by mouth every morning. Reported on 08/19/2015   metoprolol succinate 50 MG 24 hr tablet Commonly known as: TOPROL-XL TAKE 1 TABLET(50 MG) BY MOUTH EVERY MORNING   montelukast 10 MG tablet Commonly known as: SINGULAIR TAKE 1 TABLET(10 MG) BY MOUTH AT BEDTIME   ondansetron 8 MG tablet Commonly known as: Zofran Take 1 tablet (8 mg total) by mouth 2 (two) times daily as needed (Nausea or vomiting).   penicillin v potassium 500 MG  tablet Commonly known as: VEETID Take 500 mg by mouth 4 (four) times daily. For dental   potassium chloride SA 20 MEQ tablet Commonly known as: KLOR-CON M TAKE 1 TABLET(20 MEQ) BY MOUTH TWICE DAILY   PROBIOTIC DAILY PO Take by mouth daily.   triamcinolone 55 MCG/ACT Aero nasal inhaler Commonly known as: NASACORT Place 2 sprays into the nose as needed.   triamterene-hydrochlorothiazide 37.5-25 MG tablet Commonly known as: MAXZIDE-25 1 tablet by mouth at bedtime   valACYclovir 1000 MG tablet Commonly known as: VALTREX TAKE 1 TABLET(1000 MG) BY MOUTH THREE TIMES DAILY   Vitamin D3 50 MCG (2000 UT) Tabs Take 2 tablets by mouth daily.   Xiidra 5 % Soln Generic drug: Lifitegrast   Zinc 50 MG Tabs Take 50 mg by mouth daily.   zinc gluconate 50 MG tablet Take 50 mg by mouth daily.        Allergies:  Allergies  Allergen Reactions   Codeine Nausea Only    Past Medical History, Surgical history, Social history, and Family History were reviewed and updated.  Review of Systems: Review of Systems  Constitutional: Negative.   HENT: Negative.    Eyes:  Positive for blurred vision.  Respiratory: Negative.    Cardiovascular: Negative.   Gastrointestinal: Negative.   Genitourinary: Negative.   Musculoskeletal: Negative.   Skin: Negative.   Neurological: Negative.   Endo/Heme/Allergies: Negative.   Psychiatric/Behavioral: Negative.      Physical Exam:  weight is 180 lb 3.2 oz (81.7 kg). Her oral temperature is 99 F (37.2 C). Her blood pressure is 168/85 (abnormal) and her pulse is 88. Her respiration is 18 and oxygen saturation is 100%.   Wt Readings from Last 3 Encounters:  03/01/21 180 lb 3.2 oz (81.7 kg)  03/01/21 180 lb 0.3 oz (81.7 kg)  02/15/21 178 lb 1.3 oz (80.8 kg)    Physical Exam Vitals reviewed.  HENT:     Head: Normocephalic and atraumatic.  Eyes:     Pupils: Pupils are equal, round, and reactive to light.  Cardiovascular:     Rate and Rhythm:  Normal rate and regular rhythm.     Heart sounds: Normal heart sounds.  Pulmonary:     Effort: Pulmonary effort is normal.     Breath sounds: Normal breath sounds.  Abdominal:     General: Bowel sounds are normal.     Palpations: Abdomen is soft.  Musculoskeletal:        General: No tenderness or deformity. Normal range of motion.     Cervical back: Normal range of motion.  Lymphadenopathy:     Cervical: No cervical adenopathy.  Skin:    General: Skin is  warm and dry.     Findings: No erythema or rash.  Neurological:     Mental Status: She is alert and oriented to person, place, and time.  Psychiatric:        Behavior: Behavior normal.        Thought Content: Thought content normal.        Judgment: Judgment normal.     Lab Results  Component Value Date   WBC 5.3 03/01/2021   HGB 9.3 (L) 03/01/2021   HCT 28.3 (L) 03/01/2021   MCV 82.7 03/01/2021   PLT 156 03/01/2021   Lab Results  Component Value Date   FERRITIN 2,225 (H) 02/01/2021   IRON 69 02/01/2021   TIBC 385 02/01/2021   UIBC 316 02/01/2021   IRONPCTSAT 18 02/01/2021   Lab Results  Component Value Date   RETICCTPCT 2.4 02/01/2021   RBC 3.42 (L) 03/01/2021   Lab Results  Component Value Date   KPAFRELGTCHN 1.0 (L) 02/01/2021   LAMBDASER 60.9 (H) 02/01/2021   KAPLAMBRATIO 0.02 (L) 02/01/2021   Lab Results  Component Value Date   IGGSERUM 277 (L) 02/01/2021   IGGSERUM 259 (L) 02/01/2021   IGA 7 (L) 02/01/2021   IGA 5 (L) 02/01/2021   IGMSERUM 6 (L) 02/01/2021   IGMSERUM 7 (L) 02/01/2021   Lab Results  Component Value Date   TOTALPROTELP 6.0 02/01/2021   ALBUMINELP 3.6 02/01/2021   A1GS 0.2 02/01/2021   A2GS 1.1 (H) 02/01/2021   BETS 1.0 02/01/2021   BETA2SER 0.4 11/23/2014   GAMS 0.2 (L) 02/01/2021   MSPIKE 0.1 (H) 02/01/2021   SPEI Comment 10/19/2020     Chemistry      Component Value Date/Time   NA 140 02/15/2021 0930   NA 141 01/10/2017 1115   NA 140 06/21/2016 0918   K 4.0  02/15/2021 0930   K 4.0 01/10/2017 1115   K 4.3 06/21/2016 0918   CL 103 02/15/2021 0930   CL 106 01/10/2017 1115   CO2 29 02/15/2021 0930   CO2 27 01/10/2017 1115   CO2 20 (L) 06/21/2016 0918   BUN 21 02/15/2021 0930   BUN 15 01/10/2017 1115   BUN 15.8 06/21/2016 0918   CREATININE 0.90 02/15/2021 0930   CREATININE 1.0 01/10/2017 1115   CREATININE 0.8 06/21/2016 0918      Component Value Date/Time   CALCIUM 10.0 02/15/2021 0930   CALCIUM 9.5 01/10/2017 1115   CALCIUM 9.4 06/21/2016 0918   ALKPHOS 45 02/15/2021 0930   ALKPHOS 40 01/10/2017 1115   ALKPHOS 66 06/21/2016 0918   AST 13 (L) 02/15/2021 0930   AST 17 06/21/2016 0918   ALT 14 02/15/2021 0930   ALT 19 01/10/2017 1115   ALT 37 06/21/2016 0918   BILITOT 0.5 02/15/2021 0930   BILITOT 0.32 06/21/2016 0918       Impression and Plan: Emily Greer is a very pleasant 71 yo African American female with recurrent lambda light chain myeloma.  She had her second stem cell transplant for light chain myeloma was in June 2016.   We now have her on the Sarclisa/carfilzomib protocol.  Hopefully, we will see her response.  If not, we will going to have to think about protocol therapy for her.  She has been seen out at [facility].  I am not sure if they will be able to get her on to the new agent that is out there-teclistamab.  I know this is a helpful drug.  We may have  to think about getting her down to the [facility] in [place] as they have a very good myeloma program with a lot of options.  I just feel bad for her eyes.  It be nice to have the cataract surgery done.  We will go ahead with her third cycle of treatment.  I will see her back in a month.     Josph Macho, MD 1/31/202310:07 AM

## 2021-03-01 NOTE — Patient Instructions (Signed)

## 2021-03-02 ENCOUNTER — Encounter: Payer: Self-pay | Admitting: Hematology & Oncology

## 2021-03-02 ENCOUNTER — Encounter: Payer: Self-pay | Admitting: *Deleted

## 2021-03-02 LAB — IGG, IGA, IGM
IgA: 6 mg/dL — ABNORMAL LOW (ref 87–352)
IgG (Immunoglobin G), Serum: 239 mg/dL — ABNORMAL LOW (ref 586–1602)
IgM (Immunoglobulin M), Srm: 5 mg/dL — ABNORMAL LOW (ref 26–217)

## 2021-03-02 LAB — KAPPA/LAMBDA LIGHT CHAINS
Kappa free light chain: 1.4 mg/L — ABNORMAL LOW (ref 3.3–19.4)
Kappa, lambda light chain ratio: 0.02 — ABNORMAL LOW (ref 0.26–1.65)
Lambda free light chains: 59.6 mg/L — ABNORMAL HIGH (ref 5.7–26.3)

## 2021-03-03 LAB — PROTEIN ELECTROPHORESIS, SERUM, WITH REFLEX
A/G Ratio: 1.6 (ref 0.7–1.7)
Albumin ELP: 3.6 g/dL (ref 2.9–4.4)
Alpha-1-Globulin: 0.3 g/dL (ref 0.0–0.4)
Alpha-2-Globulin: 0.9 g/dL (ref 0.4–1.0)
Beta Globulin: 1 g/dL (ref 0.7–1.3)
Gamma Globulin: 0.2 g/dL — ABNORMAL LOW (ref 0.4–1.8)
Globulin, Total: 2.3 g/dL (ref 2.2–3.9)
M-Spike, %: 0.1 g/dL — ABNORMAL HIGH
SPEP Interpretation: 0
Total Protein ELP: 5.9 g/dL — ABNORMAL LOW (ref 6.0–8.5)

## 2021-03-03 LAB — IMMUNOFIXATION REFLEX, SERUM
IgA: 6 mg/dL — ABNORMAL LOW (ref 87–352)
IgG (Immunoglobin G), Serum: 240 mg/dL — ABNORMAL LOW (ref 586–1602)
IgM (Immunoglobulin M), Srm: 5 mg/dL — ABNORMAL LOW (ref 26–217)

## 2021-03-09 ENCOUNTER — Encounter: Payer: Self-pay | Admitting: *Deleted

## 2021-03-09 ENCOUNTER — Encounter: Payer: Self-pay | Admitting: Obstetrics & Gynecology

## 2021-03-09 ENCOUNTER — Other Ambulatory Visit: Payer: Self-pay

## 2021-03-09 ENCOUNTER — Encounter: Payer: Self-pay | Admitting: Hematology & Oncology

## 2021-03-09 ENCOUNTER — Ambulatory Visit (INDEPENDENT_AMBULATORY_CARE_PROVIDER_SITE_OTHER): Payer: Medicare Other | Admitting: Obstetrics & Gynecology

## 2021-03-09 VITALS — BP 174/89 | HR 92 | Resp 16 | Ht 64.0 in | Wt 178.0 lb

## 2021-03-09 DIAGNOSIS — Z8542 Personal history of malignant neoplasm of other parts of uterus: Secondary | ICD-10-CM

## 2021-03-09 DIAGNOSIS — Z01419 Encounter for gynecological examination (general) (routine) without abnormal findings: Secondary | ICD-10-CM

## 2021-03-09 NOTE — Progress Notes (Signed)
Pt is out of her Femara 2.5 mg

## 2021-03-10 ENCOUNTER — Telehealth: Payer: Self-pay | Admitting: *Deleted

## 2021-03-10 NOTE — Telephone Encounter (Signed)
Per Dr Berline Lopes scheduled the patient for a follow up with Dr Berline Lopes on 4/6 at 1 pm

## 2021-03-17 NOTE — Progress Notes (Signed)
Subjective:     Emily Greer is a 71 y.o. female here for a routine exam.  Current complaints: Pt reports that she is out of Femara. Pt has uterine cancer with vaginal wall extension. She reports that Dr. Janie Greer followed her for years but, she missed her last appt and needs a refill. She has been off the meds for 2 weeks. Sh reports that she is supposed to stay on it for the rest of her life. She reports that she is unable to travel to [facility] now due to other responsibilities.     Pt is also being followed by Dr. Marin Greer for Multiple myeloma.    Gynecologic History No LMP recorded. Patient has had a hysterectomy. Contraception: post menopausal status Last Pap: 10/21. Results were: ASCUS with +hrHPV Last mammogram: 1/21. Results were: normal  Obstetric History OB History  Gravida Para Term Preterm AB Living  $Remov'3 2     1 2  'cZQkjx$ SAB IAB Ectopic Multiple Live Births  1       2    # Outcome Date GA Lbr Len/2nd Weight Sex Delivery Anes PTL Lv  3 SAB 1992          2 Para 1985    U Vag-Spont   LIV  1 Para 1982    U Vag-Spont   LIV     The following portions of the patient's history were reviewed and updated as appropriate: allergies, current medications, past family history, past medical history, past social history, past surgical history, and problem list.  Review of Systems Pertinent items are noted in HPI.    Objective:  Blood pressure (!) 174/89, pulse 92, resp. rate 16, height $RemoveBe'5\' 4"'sOIrDMtzn$  (1.626 m), weight 178 lb (80.7 kg). General Appearance:    Alert, cooperative, no distress, appears stated age  Head:    Normocephalic, without obvious abnormality, atraumatic  Eyes:    conjunctiva/corneas clear, EOM's intact, both eyes  Ears:    Normal external ear canals, both ears  Nose:   Nares normal, septum midline, mucosa normal, no drainage    or sinus tenderness  Throat:   Lips, mucosa, and tongue normal; teeth and gums normal  Neck:   Supple, symmetrical, trachea midline, no  adenopathy;    thyroid:  no enlargement/tenderness/nodules  Back:     Symmetric, no curvature, ROM normal, no CVA tenderness  Lungs:     respirations unlabored  Chest Wall:    No tenderness or deformity   Heart:    Regular rate and rhythm  Breast Exam:    No tenderness, masses, or nipple abnormality  Abdomen:     Soft, non-tender, bowel sounds active all four quadrants,    no masses, no organomegaly  Genitalia:    Normal female without lesion, discharge or tenderness   I can only advance the speculum 2cm due to scaring.    Extremities:   Extremities normal, atraumatic, no cyanosis or edema  Pulses:   2+ and symmetric all extremities  Skin:   Skin color, texture, turgor normal, no rashes or lesions    Assessment:    Healthy female exam.  H/o uterine cancer. 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   Plan:  Emily Greer was seen today for gynecologic exam.  Diagnoses and all orders for this visit:  Well female exam with routine gynecological exam  History of uterine cancer   I will f/u with Dr. Berline Greer about pts GYN ONC care and get back to pt.  Emily Greer, M.D., [place]   Addendum: She'll be 5 years out from treatment for her second recurrence in March.  I think its reasonable to keep her on the letrozole if she's tolerating it. I would repeat a bone scan - last was in 12/2018, and Medicare usually pays for one every 2 years.  From a cancer standpoint, she'll need yearly visits after March for an exam and symptom review. Given her surgical history and radiation, her vagina has been described post treatment as being only 2 cm in length. I think whatever speculum exam can be performed, external exam, and a vaginal and rectovaginal exam would be most appropriate.  Her pap/HPV history can be followed per ASCCP guidelines.  If you'd prefer that I see her to put recommendations formally in her chart before discharging her to you.  Dr. Berline Greer will see pt, document a plan of  care and then I will resume her care.   Emily Greer L. Greer, M.D., Emily Greer

## 2021-03-22 NOTE — Progress Notes (Signed)
Day 8 and 15 deleted of previous deleted per Dr. Antonieta Pert instructions. Patient will start with a new cycle on 2/28.

## 2021-03-29 ENCOUNTER — Encounter: Payer: Self-pay | Admitting: Hematology & Oncology

## 2021-03-29 ENCOUNTER — Inpatient Hospital Stay: Payer: Medicare Other

## 2021-03-29 ENCOUNTER — Inpatient Hospital Stay: Payer: Medicare Other | Attending: Hematology & Oncology

## 2021-03-29 ENCOUNTER — Other Ambulatory Visit: Payer: Self-pay

## 2021-03-29 ENCOUNTER — Inpatient Hospital Stay (HOSPITAL_BASED_OUTPATIENT_CLINIC_OR_DEPARTMENT_OTHER): Payer: Medicare Other | Admitting: Hematology & Oncology

## 2021-03-29 VITALS — BP 149/80 | HR 97 | Temp 99.0°F | Resp 16 | Ht 63.5 in | Wt 177.0 lb

## 2021-03-29 DIAGNOSIS — Z8542 Personal history of malignant neoplasm of other parts of uterus: Secondary | ICD-10-CM | POA: Insufficient documentation

## 2021-03-29 DIAGNOSIS — Z5112 Encounter for antineoplastic immunotherapy: Secondary | ICD-10-CM | POA: Diagnosis not present

## 2021-03-29 DIAGNOSIS — Z923 Personal history of irradiation: Secondary | ICD-10-CM | POA: Insufficient documentation

## 2021-03-29 DIAGNOSIS — Z5111 Encounter for antineoplastic chemotherapy: Secondary | ICD-10-CM | POA: Diagnosis not present

## 2021-03-29 DIAGNOSIS — Z9484 Stem cells transplant status: Secondary | ICD-10-CM | POA: Diagnosis not present

## 2021-03-29 DIAGNOSIS — Z885 Allergy status to narcotic agent status: Secondary | ICD-10-CM | POA: Diagnosis not present

## 2021-03-29 DIAGNOSIS — Z79899 Other long term (current) drug therapy: Secondary | ICD-10-CM | POA: Insufficient documentation

## 2021-03-29 DIAGNOSIS — D500 Iron deficiency anemia secondary to blood loss (chronic): Secondary | ICD-10-CM

## 2021-03-29 DIAGNOSIS — C9000 Multiple myeloma not having achieved remission: Secondary | ICD-10-CM

## 2021-03-29 DIAGNOSIS — H538 Other visual disturbances: Secondary | ICD-10-CM | POA: Diagnosis not present

## 2021-03-29 DIAGNOSIS — C541 Malignant neoplasm of endometrium: Secondary | ICD-10-CM

## 2021-03-29 DIAGNOSIS — Z95828 Presence of other vascular implants and grafts: Secondary | ICD-10-CM

## 2021-03-29 DIAGNOSIS — D509 Iron deficiency anemia, unspecified: Secondary | ICD-10-CM | POA: Diagnosis not present

## 2021-03-29 DIAGNOSIS — E876 Hypokalemia: Secondary | ICD-10-CM

## 2021-03-29 DIAGNOSIS — C9001 Multiple myeloma in remission: Secondary | ICD-10-CM

## 2021-03-29 LAB — CMP (CANCER CENTER ONLY)
ALT: 11 U/L (ref 0–44)
AST: 14 U/L — ABNORMAL LOW (ref 15–41)
Albumin: 3.8 g/dL (ref 3.5–5.0)
Alkaline Phosphatase: 46 U/L (ref 38–126)
Anion gap: 11 (ref 5–15)
BUN: 9 mg/dL (ref 8–23)
CO2: 27 mmol/L (ref 22–32)
Calcium: 8.8 mg/dL — ABNORMAL LOW (ref 8.9–10.3)
Chloride: 101 mmol/L (ref 98–111)
Creatinine: 0.84 mg/dL (ref 0.44–1.00)
GFR, Estimated: >60 mL/min (ref >60)
Glucose, Bld: 127 mg/dL — ABNORMAL HIGH (ref 70–99)
Potassium: 2.9 mmol/L — ABNORMAL LOW (ref 3.5–5.1)
Sodium: 139 mmol/L (ref 135–145)
Total Bilirubin: 0.4 mg/dL (ref 0.3–1.2)
Total Protein: 6.1 g/dL — ABNORMAL LOW (ref 6.5–8.1)

## 2021-03-29 LAB — CBC WITH DIFFERENTIAL (CANCER CENTER ONLY)
Abs Immature Granulocytes: 0.02 10*3/uL (ref 0.00–0.07)
Basophils Absolute: 0.1 10*3/uL (ref 0.0–0.1)
Basophils Relative: 1 %
Eosinophils Absolute: 0.1 10*3/uL (ref 0.0–0.5)
Eosinophils Relative: 2 %
HCT: 28.9 % — ABNORMAL LOW (ref 36.0–46.0)
Hemoglobin: 9.3 g/dL — ABNORMAL LOW (ref 12.0–15.0)
Immature Granulocytes: 0 %
Lymphocytes Relative: 5 %
Lymphs Abs: 0.3 10*3/uL — ABNORMAL LOW (ref 0.7–4.0)
MCH: 26.8 pg (ref 26.0–34.0)
MCHC: 32.2 g/dL (ref 30.0–36.0)
MCV: 83.3 fL (ref 80.0–100.0)
Monocytes Absolute: 0.9 10*3/uL (ref 0.1–1.0)
Monocytes Relative: 16 %
Neutro Abs: 4.2 10*3/uL (ref 1.7–7.7)
Neutrophils Relative %: 76 %
Platelet Count: 173 10*3/uL (ref 150–400)
RBC: 3.47 MIL/uL — ABNORMAL LOW (ref 3.87–5.11)
RDW: 19.9 % — ABNORMAL HIGH (ref 11.5–15.5)
WBC Count: 5.6 10*3/uL (ref 4.0–10.5)
nRBC: 0 % (ref 0.0–0.2)

## 2021-03-29 LAB — LACTATE DEHYDROGENASE: LDH: 185 U/L (ref 98–192)

## 2021-03-29 LAB — IRON AND IRON BINDING CAPACITY (CC-WL,HP ONLY)
Iron: 31 ug/dL (ref 28–170)
Saturation Ratios: 9 % — ABNORMAL LOW (ref 10.4–31.8)
TIBC: 363 ug/dL (ref 250–450)
UIBC: 332 ug/dL (ref 148–442)

## 2021-03-29 LAB — FERRITIN: Ferritin: 1889 ng/mL — ABNORMAL HIGH (ref 11–307)

## 2021-03-29 MED ORDER — SODIUM CHLORIDE 0.9% FLUSH
10.0000 mL | INTRAVENOUS | Status: DC | PRN
  Administered 2021-03-29: 10 mL

## 2021-03-29 MED ORDER — SODIUM CHLORIDE 0.9 % IV SOLN: Freq: Once | INTRAVENOUS | Status: DC

## 2021-03-29 MED ORDER — DEXTROSE 5 % IV SOLN
56.0000 mg/m2 | Freq: Once | INTRAVENOUS | Status: AC
  Administered 2021-03-29: 110 mg via INTRAVENOUS
  Filled 2021-03-29: qty 30

## 2021-03-29 MED ORDER — POTASSIUM CHLORIDE CRYS ER 20 MEQ PO TBCR
40.0000 meq | EXTENDED_RELEASE_TABLET | Freq: Once | ORAL | Status: AC
  Administered 2021-03-29: 40 meq via ORAL
  Filled 2021-03-29: qty 2

## 2021-03-29 MED ORDER — PALONOSETRON HCL INJECTION 0.25 MG/5ML
0.2500 mg | Freq: Once | INTRAVENOUS | Status: AC
  Administered 2021-03-29: 0.25 mg via INTRAVENOUS
  Filled 2021-03-29: qty 5

## 2021-03-29 MED ORDER — POTASSIUM CHLORIDE 20 MEQ PO PACK: 40.0000 meq | PACK | Freq: Once | ORAL | Status: DC

## 2021-03-29 MED ORDER — DIPHENHYDRAMINE HCL 50 MG/ML IJ SOLN
50.0000 mg | Freq: Once | INTRAMUSCULAR | Status: AC
  Administered 2021-03-29: 50 mg via INTRAVENOUS
  Filled 2021-03-29: qty 1

## 2021-03-29 MED ORDER — SODIUM CHLORIDE 0.9% FLUSH
10.0000 mL | Freq: Once | INTRAVENOUS | Status: AC
  Administered 2021-03-29: 10 mL via INTRAVENOUS

## 2021-03-29 MED ORDER — SODIUM CHLORIDE 0.9 % IV SOLN
510.0000 mg | Freq: Once | INTRAVENOUS | Status: AC
  Administered 2021-03-29: 510 mg via INTRAVENOUS
  Filled 2021-03-29: qty 17

## 2021-03-29 MED ORDER — SODIUM CHLORIDE 0.9 % IV SOLN
10.0000 mg/kg | Freq: Once | INTRAVENOUS | Status: AC
  Administered 2021-03-29: 800 mg via INTRAVENOUS
  Filled 2021-03-29: qty 25

## 2021-03-29 MED ORDER — SODIUM CHLORIDE 0.9 % IV SOLN
20.0000 mg | Freq: Once | INTRAVENOUS | Status: AC
  Administered 2021-03-29: 20 mg via INTRAVENOUS
  Filled 2021-03-29: qty 20

## 2021-03-29 MED ORDER — HEPARIN SOD (PORK) LOCK FLUSH 100 UNIT/ML IV SOLN
500.0000 [IU] | Freq: Once | INTRAVENOUS | Status: AC | PRN
  Administered 2021-03-29: 500 [IU]

## 2021-03-29 MED ORDER — SODIUM CHLORIDE 0.9 % IV SOLN: Freq: Once | INTRAVENOUS | Status: AC

## 2021-03-29 MED ORDER — FAMOTIDINE IN NACL 20-0.9 MG/50ML-% IV SOLN
20.0000 mg | Freq: Once | INTRAVENOUS | Status: AC
  Administered 2021-03-29: 20 mg via INTRAVENOUS
  Filled 2021-03-29: qty 50

## 2021-03-29 MED ORDER — ACETAMINOPHEN 325 MG PO TABS
650.0000 mg | ORAL_TABLET | Freq: Once | ORAL | Status: AC
  Administered 2021-03-29: 650 mg via ORAL
  Filled 2021-03-29: qty 2

## 2021-03-29 NOTE — Addendum Note (Signed)
Addended by: Lucile Crater on: 03/29/2021 03:29 PM   Modules accepted: Orders

## 2021-03-29 NOTE — Patient Instructions (Signed)
[facility]  Discharge Instructions: Thank you for choosing [facility] to provide your oncology and hematology care.   If you have a lab appointment with the [facility], please go directly to the [facility] and check in at the registration area.  Wear comfortable clothing and clothing appropriate for easy access to any Portacath or PICC line.   We strive to give you quality time with your provider. You may need to reschedule your appointment if you arrive late (15 or more minutes).  Arriving late affects you and other patients whose appointments are after yours.  Also, if you miss three or more appointments without notifying the office, you may be dismissed from the clinic at the providers discretion.      For prescription refill requests, have your pharmacy contact our office and allow 72 hours for refills to be completed.    Today you received the following chemotherapy and/or immunotherapy agents kyprolis, sarclisa    To help prevent nausea and vomiting after your treatment, we encourage you to take your nausea medication as directed.  BELOW ARE SYMPTOMS THAT SHOULD BE REPORTED IMMEDIATELY: *FEVER GREATER THAN 100.4 F (38 C) OR HIGHER *CHILLS OR SWEATING *NAUSEA AND VOMITING THAT IS NOT CONTROLLED WITH YOUR NAUSEA MEDICATION *UNUSUAL SHORTNESS OF BREATH *UNUSUAL BRUISING OR BLEEDING *URINARY PROBLEMS (pain or burning when urinating, or frequent urination) *BOWEL PROBLEMS (unusual diarrhea, constipation, pain near the anus) TENDERNESS IN MOUTH AND THROAT WITH OR WITHOUT PRESENCE OF ULCERS (sore throat, sores in mouth, or a toothache) UNUSUAL RASH, SWELLING OR PAIN  UNUSUAL VAGINAL DISCHARGE OR ITCHING   Items with * indicate a potential emergency and should be followed up as soon as possible or go to the Emergency Department if any problems should occur.  Please show the CHEMOTHERAPY ALERT CARD or IMMUNOTHERAPY ALERT CARD at check-in  to the Emergency Department and triage nurse. Should you have questions after your visit or need to cancel or reschedule your appointment, please contact [facility]  [phone] and follow the prompts.  Office hours are 8:00 a.m. to 4:30 p.m. Monday - Friday. Please note that voicemails left after 4:00 p.m. may not be returned until the following business day.  We are closed weekends and major holidays. You have access to a nurse at all times for urgent questions. Please call the main number to the clinic [phone] and follow the prompts.  For any non-urgent questions, you may also contact your provider using MyChart. We now offer e-Visits for anyone 36 and older to request care online for non-urgent symptoms. For details visit [email or web address].   Also download the MyChart app! Go to the app store, search "MyChart", open the app, select [facility], and log in with your MyChart username and password.  Due to Covid, a mask is required upon entering the hospital/clinic. If you do not have a mask, one will be given to you upon arrival. For doctor visits, patients may have 1 support person aged 49 or older with them. For treatment visits, patients cannot have anyone with them due to current Covid guidelines and our immunocompromised population.

## 2021-03-29 NOTE — Addendum Note (Signed)
Addended by: Arbutus Ped on: 03/29/2021 10:48 AM   Modules accepted: Orders

## 2021-03-29 NOTE — Addendum Note (Signed)
Addended by: Lucile Crater on: 03/29/2021 12:37 PM   Modules accepted: Orders

## 2021-03-29 NOTE — Progress Notes (Signed)
Hematology and Oncology Follow Up Visit  Emily Greer [id number] July 29, 1950 71 y.o. 03/29/2021   Principle Diagnosis:  Recurrent lambda light chain myeloma - nl cytogenetics History of recurrent endometrial carcinoma Iron deficiency anemia -blood loss   Past Therapy:             Status post second autologous stem cell transplant on 07/24/2014 Maintenance therapy with Pomalidomide/every 2 week Velcade - d/c'ed Radiation therapy for endometrial recurrence - completed 04/20/2015 Pomalyst/Kyprolis 70mg /m2 IV q 2 weeks - s/p cycle #12 - held on 12/26/2017 for hematuria Daratumumab/Pomalyst (1 mg) - status post cycle 19 -- d/c on 08/21/2019 Melflufen 40 mg IV q 4 weeks -- started on 08/27/2019, s/p cycle #2 --  D/c due to FDA removal Selinexor 60 mg po q week -- start on 01/19/2020 -- changed on 03/18/2020 -- d/c on 04/27/2020   Current Therapy:        Blenrep 2.5 mg/m2 IV q 3 weeks -- started on 05/20/2020, s/p cycle #7 --D/C secondary to progression on 12/22/2020 Carfilzomib/Sarclisa -- s/p cycle #3 -- start on 01/03/2021 Femara 2.5 mg po q day  IV iron-Feraheme given on 03/29/2021    Interim History:  Emily Greer is here today for follow-up.  So far, she is doing okay.  She had a very busy weekend.  It was her mom's 95th birthday.  In addition to her birthday, there is 23 other birthdays in the family for the month of February.  She seems be doing okay with the carfilzomib/Sarclisa protocol.  Her last lambda light chain was holding steady at 6 mg/dL.  I told her that we find that she is increasing her light chains, we will probably get her down to [name] to the [facility] and see if they have a protocol for her with one of the CAR-T therapies or one of the off-the-shelf  BCMA therapies.  I think that would be a really good option for her.  She is still in great shape.  She has had no problems with fever.  There is no cough or shortness of breath.  She has had no  nausea or vomiting.  There is no change in bowel or bladder habits.  Surprising, her iron levels are quite low today.  Her iron saturation is only 9%.  We will give her some IV iron.  She has gotten Feraheme in the past.  She has had no cough.  She is not sure when her eyes will be fixed with respect to her cataracts.  She thinks this might be the end of March.  Overall, I would say that her performance status is probably ECOG 1.     Medications:  Allergies as of 03/29/2021       Reactions   Codeine Nausea Only        Medication List        Accurate as of March 29, 2021 10:12 AM. If you have any questions, ask your nurse or doctor.          acyclovir ointment 5 % Commonly known as: Zovirax Apply thin layer to affected area two to three times daily prn   albuterol 108 (90 Base) MCG/ACT inhaler Commonly known as: VENTOLIN HFA Inhale 2 puffs into the lungs every 6 (six) hours as needed for wheezing or shortness of breath. 2 puffs 3 times daily x 5 days then every 6 hours as needed.   amLODipine 10 MG tablet Commonly known as: NORVASC TAKE 1 TABLET(10 MG)  BY MOUTH EVERY MORNING   aspirin EC 81 MG tablet Take 81 mg by mouth daily.   CALCIUM 1200+D3 PO Take 1 tablet by mouth daily.   cetirizine 10 MG tablet Commonly known as: ZYRTEC Take 1 tablet (10 mg total) by mouth at bedtime.   chlorhexidine 0.12 % solution Commonly known as: PERIDEX SMARTSIG:By Mouth   Fluocinolone Acetonide Scalp 0.01 % Oil Apply topically.   hydrochlorothiazide 12.5 MG capsule Commonly known as: MICROZIDE Take 12.5 mg by mouth every morning.   letrozole 2.5 MG tablet Commonly known as: FEMARA Take 1 tablet (2.5 mg total) by mouth daily.   lidocaine 5 % Commonly known as: LIDODERM Place 1 patch onto the skin daily. Remove & Discard patch within 12 hours or as directed by MD   lidocaine-prilocaine cream Commonly known as: EMLA Apply 1 application topically as needed.    loratadine 10 MG tablet Commonly known as: CLARITIN Take 10 mg by mouth every morning. Reported on 08/19/2015   metoprolol succinate 50 MG 24 hr tablet Commonly known as: TOPROL-XL TAKE 1 TABLET(50 MG) BY MOUTH EVERY MORNING   montelukast 10 MG tablet Commonly known as: SINGULAIR TAKE 1 TABLET(10 MG) BY MOUTH AT BEDTIME   ondansetron 8 MG tablet Commonly known as: Zofran Take 1 tablet (8 mg total) by mouth 2 (two) times daily as needed (Nausea or vomiting).   penicillin v potassium 500 MG tablet Commonly known as: VEETID Take 500 mg by mouth 4 (four) times daily. For dental   Potassium Chloride ER 20 MEQ Tbcr tablet   potassium chloride SA 20 MEQ tablet Commonly known as: KLOR-CON M TAKE 1 TABLET(20 MEQ) BY MOUTH TWICE DAILY   PROBIOTIC DAILY PO Take by mouth daily.   triamcinolone 55 MCG/ACT Aero nasal inhaler Commonly known as: NASACORT Place 2 sprays into the nose as needed.   triamterene-hydrochlorothiazide 37.5-25 MG tablet Commonly known as: MAXZIDE-25 1 tablet by mouth at bedtime   valACYclovir 1000 MG tablet Commonly known as: VALTREX TAKE 1 TABLET(1000 MG) BY MOUTH THREE TIMES DAILY   Vitamin D3 50 MCG (2000 UT) Tabs Take 2 tablets by mouth daily.   Xiidra 5 % Soln Generic drug: Lifitegrast   Zinc 50 MG Tabs Take 50 mg by mouth daily.   zinc gluconate 50 MG tablet Take 50 mg by mouth daily.        Allergies:  Allergies  Allergen Reactions   Codeine Nausea Only    Past Medical History, Surgical history, Social history, and Family History were reviewed and updated.  Review of Systems: Review of Systems  Constitutional: Negative.   HENT: Negative.    Eyes:  Positive for blurred vision.  Respiratory: Negative.    Cardiovascular: Negative.   Gastrointestinal: Negative.   Genitourinary: Negative.   Musculoskeletal: Negative.   Skin: Negative.   Neurological: Negative.   Endo/Heme/Allergies: Negative.   Psychiatric/Behavioral:  Negative.      Physical Exam:  height is 5' 3.5" (1.613 m) and weight is 177 lb (80.3 kg). Her oral temperature is 99 F (37.2 C). Her blood pressure is 149/80 (abnormal) and her pulse is 97. Her respiration is 16 and oxygen saturation is 100%.   Wt Readings from Last 3 Encounters:  03/29/21 177 lb (80.3 kg)  03/09/21 178 lb (80.7 kg)  03/01/21 180 lb 3.2 oz (81.7 kg)    Physical Exam Vitals reviewed.  HENT:     Head: Normocephalic and atraumatic.  Eyes:     Pupils: Pupils are equal,  round, and reactive to light.  Cardiovascular:     Rate and Rhythm: Normal rate and regular rhythm.     Heart sounds: Normal heart sounds.  Pulmonary:     Effort: Pulmonary effort is normal.     Breath sounds: Normal breath sounds.  Abdominal:     General: Bowel sounds are normal.     Palpations: Abdomen is soft.  Musculoskeletal:        General: No tenderness or deformity. Normal range of motion.     Cervical back: Normal range of motion.  Lymphadenopathy:     Cervical: No cervical adenopathy.  Skin:    General: Skin is warm and dry.     Findings: No erythema or rash.  Neurological:     Mental Status: She is alert and oriented to person, place, and time.  Psychiatric:        Behavior: Behavior normal.        Thought Content: Thought content normal.        Judgment: Judgment normal.     Lab Results  Component Value Date   WBC 5.6 03/29/2021   HGB 9.3 (L) 03/29/2021   HCT 28.9 (L) 03/29/2021   MCV 83.3 03/29/2021   PLT 173 03/29/2021   Lab Results  Component Value Date   FERRITIN 2,225 (H) 02/01/2021   IRON 69 02/01/2021   TIBC 385 02/01/2021   UIBC 316 02/01/2021   IRONPCTSAT 18 02/01/2021   Lab Results  Component Value Date   RETICCTPCT 2.4 02/01/2021   RBC 3.47 (L) 03/29/2021   Lab Results  Component Value Date   KPAFRELGTCHN 1.4 (L) 03/01/2021   LAMBDASER 59.6 (H) 03/01/2021   KAPLAMBRATIO 0.02 (L) 03/01/2021   Lab Results  Component Value Date   IGGSERUM 239  (L) 03/01/2021   IGGSERUM 240 (L) 03/01/2021   IGA 6 (L) 03/01/2021   IGA 6 (L) 03/01/2021   IGMSERUM 5 (L) 03/01/2021   IGMSERUM 5 (L) 03/01/2021   Lab Results  Component Value Date   TOTALPROTELP 5.9 (L) 03/01/2021   ALBUMINELP 3.6 03/01/2021   A1GS 0.3 03/01/2021   A2GS 0.9 03/01/2021   BETS 1.0 03/01/2021   BETA2SER 0.4 11/23/2014   GAMS 0.2 (L) 03/01/2021   MSPIKE 0.1 (H) 03/01/2021   SPEI Comment 10/19/2020     Chemistry      Component Value Date/Time   NA 139 03/29/2021 0942   NA 141 01/10/2017 1115   NA 140 06/21/2016 0918   K 2.9 (L) 03/29/2021 0942   K 4.0 01/10/2017 1115   K 4.3 06/21/2016 0918   CL 101 03/29/2021 0942   CL 106 01/10/2017 1115   CO2 27 03/29/2021 0942   CO2 27 01/10/2017 1115   CO2 20 (L) 06/21/2016 0918   BUN 9 03/29/2021 0942   BUN 15 01/10/2017 1115   BUN 15.8 06/21/2016 0918   CREATININE 0.84 03/29/2021 0942   CREATININE 1.0 01/10/2017 1115   CREATININE 0.8 06/21/2016 0918      Component Value Date/Time   CALCIUM 8.8 (L) 03/29/2021 0942   CALCIUM 9.5 01/10/2017 1115   CALCIUM 9.4 06/21/2016 0918   ALKPHOS 46 03/29/2021 0942   ALKPHOS 40 01/10/2017 1115   ALKPHOS 66 06/21/2016 0918   AST 14 (L) 03/29/2021 0942   AST 17 06/21/2016 0918   ALT 11 03/29/2021 0942   ALT 19 01/10/2017 1115   ALT 37 06/21/2016 0918   BILITOT 0.4 03/29/2021 0942   BILITOT 0.32 06/21/2016 0981  Impression and Plan: Emily Greer is a very pleasant 71 yo African American female with recurrent lambda light chain myeloma.  She had her second stem cell transplant for light chain myeloma was in June 2016.   We now have her on the Sarclisa/carfilzomib protocol.  Hopefully, we will see her response.  If not, we will going to have to think about clinical trial for her.  Again, I think the [facility] will be a great place for her to go.  We will see how she does with this cycle of treatment.  I would like to plan to see her back in another  month.    Josph Macho, MD 2/28/202310:12 AM

## 2021-03-29 NOTE — Progress Notes (Signed)
MD reviewed pt labs.VO " ok to treat despite counts"  VO for K+ 53meq now and repeat x1 prior to leaving or pt may take when she gets home.

## 2021-03-30 LAB — IGG, IGA, IGM
IgA: 6 mg/dL — ABNORMAL LOW (ref 87–352)
IgG (Immunoglobin G), Serum: 241 mg/dL — ABNORMAL LOW (ref 586–1602)
IgM (Immunoglobulin M), Srm: <5 mg/dL — ABNORMAL LOW (ref 26–217)

## 2021-03-30 LAB — KAPPA/LAMBDA LIGHT CHAINS
Kappa free light chain: 1.7 mg/L — ABNORMAL LOW (ref 3.3–19.4)
Kappa, lambda light chain ratio: 0.02 — ABNORMAL LOW (ref 0.26–1.65)
Lambda free light chains: 81.7 mg/L — ABNORMAL HIGH (ref 5.7–26.3)

## 2021-03-31 ENCOUNTER — Other Ambulatory Visit: Payer: Self-pay | Admitting: *Deleted

## 2021-03-31 ENCOUNTER — Encounter: Payer: Self-pay | Admitting: Hematology & Oncology

## 2021-03-31 ENCOUNTER — Telehealth: Payer: Self-pay | Admitting: *Deleted

## 2021-03-31 DIAGNOSIS — C9000 Multiple myeloma not having achieved remission: Secondary | ICD-10-CM

## 2021-03-31 DIAGNOSIS — E876 Hypokalemia: Secondary | ICD-10-CM

## 2021-03-31 NOTE — Telephone Encounter (Signed)
Patient notified per order of Dr. Marin Olp "that the lambda light chain is going up.  We will have to see about making a referral for a clinical trial.  I will call [facility].  Pete"  Pt appreciative of call. Urgent referral placed to Dr. Pedro Earls per order of Dr. Marin Olp.  ? ? ?

## 2021-03-31 NOTE — Telephone Encounter (Signed)
-----   Message from [name], MD sent at 03/30/2021  5:42 PM EST ----- ?Call and let him know that the lambda light chain is going up.  We will have to see about making referral for a clinical trial.  I will call [facility].  Pete ?

## 2021-04-01 ENCOUNTER — Telehealth: Payer: Self-pay | Admitting: *Deleted

## 2021-04-01 LAB — PROTEIN ELECTROPHORESIS, SERUM, WITH REFLEX
A/G Ratio: 1.4 (ref 0.7–1.7)
Albumin ELP: 3.3 g/dL (ref 2.9–4.4)
Alpha-1-Globulin: 0.2 g/dL (ref 0.0–0.4)
Alpha-2-Globulin: 0.9 g/dL (ref 0.4–1.0)
Beta Globulin: 0.9 g/dL (ref 0.7–1.3)
Gamma Globulin: 0.2 g/dL — ABNORMAL LOW (ref 0.4–1.8)
Globulin, Total: 2.3 g/dL (ref 2.2–3.9)
M-Spike, %: 0.1 g/dL — ABNORMAL HIGH
SPEP Interpretation: 0
Total Protein ELP: 5.6 g/dL — ABNORMAL LOW (ref 6.0–8.5)

## 2021-04-01 LAB — IMMUNOFIXATION REFLEX, SERUM
IgA: 7 mg/dL — ABNORMAL LOW (ref 87–352)
IgG (Immunoglobin G), Serum: 263 mg/dL — ABNORMAL LOW (ref 586–1602)
IgM (Immunoglobulin M), Srm: 5 mg/dL — ABNORMAL LOW (ref 26–217)

## 2021-04-01 NOTE — Telephone Encounter (Signed)
Faxed referral to [facility] to Dr. Pedro Earls @ [phone] ?

## 2021-04-05 ENCOUNTER — Other Ambulatory Visit: Payer: Self-pay

## 2021-04-05 ENCOUNTER — Encounter: Payer: Self-pay | Admitting: Internal Medicine

## 2021-04-05 ENCOUNTER — Ambulatory Visit (INDEPENDENT_AMBULATORY_CARE_PROVIDER_SITE_OTHER): Payer: Medicare Other | Admitting: Internal Medicine

## 2021-04-05 VITALS — BP 130/70 | HR 80 | Temp 98.3°F | Ht 63.5 in | Wt 176.6 lb

## 2021-04-05 DIAGNOSIS — E6609 Other obesity due to excess calories: Secondary | ICD-10-CM | POA: Diagnosis not present

## 2021-04-05 DIAGNOSIS — I10 Essential (primary) hypertension: Secondary | ICD-10-CM | POA: Diagnosis not present

## 2021-04-05 DIAGNOSIS — J209 Acute bronchitis, unspecified: Secondary | ICD-10-CM

## 2021-04-05 DIAGNOSIS — E876 Hypokalemia: Secondary | ICD-10-CM

## 2021-04-05 DIAGNOSIS — Z6830 Body mass index (BMI) 30.0-30.9, adult: Secondary | ICD-10-CM | POA: Diagnosis not present

## 2021-04-05 DIAGNOSIS — Z23 Encounter for immunization: Secondary | ICD-10-CM

## 2021-04-05 DIAGNOSIS — R0982 Postnasal drip: Secondary | ICD-10-CM | POA: Diagnosis not present

## 2021-04-05 DIAGNOSIS — C9000 Multiple myeloma not having achieved remission: Secondary | ICD-10-CM | POA: Diagnosis not present

## 2021-04-05 MED ORDER — AZELASTINE HCL 0.1 % NA SOLN
1.0000 | Freq: Two times a day (BID) | NASAL | 12 refills | Status: DC
Start: 2021-04-05 — End: 2021-10-27

## 2021-04-05 MED ORDER — AMLODIPINE BESYLATE 10 MG PO TABS: ORAL_TABLET | ORAL | 1 refills | Status: DC

## 2021-04-05 MED ORDER — CEFTRIAXONE SODIUM 500 MG IJ SOLR
500.0000 mg | Freq: Once | INTRAMUSCULAR | Status: AC
  Administered 2021-04-05: 500 mg via INTRAMUSCULAR

## 2021-04-05 NOTE — Patient Instructions (Addendum)
Continue with zyrtec at night ?Start nasal spray in mornings ?Stop claritin in mornings ? ?If symptoms persist, call Dr. Baird Cancer ? ?Hypertension, Adult ?Hypertension is another name for high blood pressure. High blood pressure forces your heart to work harder to pump blood. This can cause problems over time. ?There are two numbers in a blood pressure reading. There is a top number (systolic) over a bottom number (diastolic). It is best to have a blood pressure that is below 120/80. Healthy choices can help lower your blood pressure, or you may need medicine to help lower it. ?What are the causes? ?The cause of this condition is not known. Some conditions may be related to high blood pressure. ?What increases the risk? ?Smoking. ?Having type 2 diabetes mellitus, high cholesterol, or both. ?Not getting enough exercise or physical activity. ?Being overweight. ?Having too much fat, sugar, calories, or salt (sodium) in your diet. ?Drinking too much alcohol. ?Having long-term (chronic) kidney disease. ?Having a family history of high blood pressure. ?Age. Risk increases with age. ?Race. You may be at higher risk if you are African American. ?Gender. Men are at higher risk than women before age 55. After age 2, women are at higher risk than men. ?Having obstructive sleep apnea. ?Stress. ?What are the signs or symptoms? ?High blood pressure may not cause symptoms. Very high blood pressure (hypertensive crisis) may cause: ?Headache. ?Feelings of worry or nervousness (anxiety). ?Shortness of breath. ?Nosebleed. ?A feeling of being sick to your stomach (nausea). ?Throwing up (vomiting). ?Changes in how you see. ?Very bad chest pain. ?Seizures. ?How is this treated? ?This condition is treated by making healthy lifestyle changes, such as: ?Eating healthy foods. ?Exercising more. ?Drinking less alcohol. ?Your health care provider may prescribe medicine if lifestyle changes are not enough to get your blood pressure under control,  and if: ?Your top number is above 130. ?Your bottom number is above 80. ?Your personal target blood pressure may vary. ?Follow these instructions at home: ?Eating and drinking ? ?If told, follow the DASH eating plan. To follow this plan: ?Fill one half of your plate at each meal with fruits and vegetables. ?Fill one fourth of your plate at each meal with whole grains. Whole grains include whole-wheat pasta, brown rice, and whole-grain bread. ?Eat or drink low-fat dairy products, such as skim milk or low-fat yogurt. ?Fill one fourth of your plate at each meal with low-fat (lean) proteins. Low-fat proteins include fish, chicken without skin, eggs, beans, and tofu. ?Avoid fatty meat, cured and processed meat, or chicken with skin. ?Avoid pre-made or processed food. ?Eat less than 1,500 mg of salt each day. ?Do not drink alcohol if: ?Your doctor tells you not to drink. ?You are pregnant, may be pregnant, or are planning to become pregnant. ?If you drink alcohol: ?Limit how much you use to: ?0-1 drink a day for women. ?0-2 drinks a day for men. ?Be aware of how much alcohol is in your drink. In the [place] one drink equals one 12 oz bottle of beer (355 mL), one 5 oz glass of wine (148 mL), or one 1? oz glass of hard liquor (44 mL). ?Lifestyle ? ?Work with your doctor to stay at a healthy weight or to lose weight. Ask your doctor what the best weight is for you. ?Get at least 30 minutes of exercise most days of the week. This may include walking, swimming, or biking. ?Get at least 30 minutes of exercise that strengthens your muscles (resistance exercise) at least 3  days a week. This may include lifting weights or doing Pilates. ?Do not use any products that contain nicotine or tobacco, such as cigarettes, e-cigarettes, and chewing tobacco. If you need help quitting, ask your doctor. ?Check your blood pressure at home as told by your doctor. ?Keep all follow-up visits as told by your doctor. This is  important. ?Medicines ?Take over-the-counter and prescription medicines only as told by your doctor. Follow directions carefully. ?Do not skip doses of blood pressure medicine. The medicine does not work as well if you skip doses. Skipping doses also puts you at risk for problems. ?Ask your doctor about side effects or reactions to medicines that you should watch for. ?Contact a doctor if you: ?Think you are having a reaction to the medicine you are taking. ?Have headaches that keep coming back (recurring). ?Feel dizzy. ?Have swelling in your ankles. ?Have trouble with your vision. ?Get help right away if you: ?Get a very bad headache. ?Start to feel mixed up (confused). ?Feel weak or numb. ?Feel faint. ?Have very bad pain in your: ?Chest. ?Belly (abdomen). ?Throw up more than once. ?Have trouble breathing. ?Summary ?Hypertension is another name for high blood pressure. ?High blood pressure forces your heart to work harder to pump blood. ?For most people, a normal blood pressure is less than 120/80. ?Making healthy choices can help lower blood pressure. If your blood pressure does not get lower with healthy choices, you may need to take medicine. ?This information is not intended to replace advice given to you by your health care provider. Make sure you discuss any questions you have with your health care provider. ?Document Revised: 09/26/2017 Document Reviewed: 09/26/2017 ?Elsevier Patient Education ? [place]. ? ?

## 2021-04-05 NOTE — Progress Notes (Addendum)
Emily Greer,acting as a Neurosurgeon for Gwynneth Aliment, MD.,have documented all relevant documentation on the behalf of Gwynneth Aliment, MD,as directed by  Gwynneth Aliment, MD while in the presence of Gwynneth Aliment, MD.  This visit occurred during the SARS-CoV-2 public health emergency.  Safety protocols were in place, including screening questions prior to the visit, additional usage of staff PPE, and extensive cleaning of exam room while observing appropriate contact time as indicated for disinfecting solutions.  Subjective:     Patient ID: Emily Greer , female    DOB: 1950/05/14 , 71 y.o.   MRN: [id number]   Chief Complaint  Patient presents with   Hypertension   Cough    HPI  She presents today for BP f/u. She reports compliance with meds.  She denies headaches, chest pain and shortness of breath.    However, she does report having a cough for the past 2 weeks she reports having some postnasal drip. Denies fever/chills. She adds she is on oral PCN as per her dentist.   Hypertension This is a chronic problem. The current episode started more than 1 year ago. The problem has been gradually improving since onset. The problem is controlled. Pertinent negatives include no blurred vision, chest pain, palpitations or shortness of breath. Past treatments include beta blockers and diuretics. The current treatment provides moderate improvement. Compliance problems include exercise.  There is no history of pheochromocytoma or renovascular disease.  Cough Associated symptoms include postnasal drip. Pertinent negatives include no chest pain, ear pain, fever or shortness of breath. She has tried OTC cough suppressant for the symptoms. The treatment provided no relief.    Past Medical History:  Diagnosis Date   Avascular necrosis of femoral head (HCC)    bilateral per CT 07-26-2015   Endometrial carcinoma [facility]) gyn oncologist-  dr Nelly Rout ([facility])/  radiation oncologist--  dr Roselind Messier   2013 dx  FIGO Stage 1A, Grade 2 endometrioid endometrial cancer s/p TAH w/ BSO and bilateral pelvic node dissection 10-31-2011 ;  recurrence at distal vagina 04/ 2014 s/p  brachytherapy (ended 07-29-2012);  2nd recurrence 12/ 2016  vaginal apex s/p  conformational radiotherapy 03-10-2015 to 04-20-2015   Family history of adverse reaction to anesthesia    mother ponv   GERD (gastroesophageal reflux disease)    Goals of care, counseling/discussion 08/21/2019   H/O stem cell transplant (HCC)    02/ 2000 and second one 06/ 2016   History of bacteremia    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   History of chemotherapy    last chemo 12-26-17   History of radiation therapy 6/4, 6/11, 6/19, 6/25, 07/29/2012   vagina 30.5 gray in 5 fx, HDR brachytherapy:   last radiation to vagina 03-10-2015 to 04-20-2015  50.4gray   History of radiation therapy 03/10/15-04/20/15   vagina 50.4 in 28 fractions   Hypertension    Lambda light chain myeloma [facility]) oncologist-  dr Myna Hidalgo ([facility])  and  [facility] -- dr Silvestre Mesi gasparetto   dx 07/ 1999 s/p  VAD chemotherapy 11/ 1999,  purged autotransplant 03-21-1998 followed by auto stem cell transplant 03-29-1998;  recurrance w/ second autologous stem cell transplant 07-24-2014;  in Re-mission currently , chemo maintenance therapy   Osteoporosis 12/18/05   Increased  risk    PONV (postoperative nausea and vomiting)    Wears glasses      Family History  Problem Relation Age of Onset   Colon  cancer Mother    Hypertension Father    Heart Problems Father      Current Outpatient Medications:    acyclovir ointment (ZOVIRAX) 5 %, Apply thin layer to affected area two to three times daily prn, Disp: 30 g, Rfl: 0   albuterol (VENTOLIN HFA) 108 (90 Base) MCG/ACT inhaler, Inhale 2 puffs into the lungs every 6 (six) hours as needed for wheezing or shortness of breath. 2 puffs 3 times daily x 5 days then every 6 hours as needed., Disp: 18 g,  Rfl: 11   aspirin EC 81 MG tablet, Take 81 mg by mouth daily., Disp: , Rfl:    azelastine (ASTELIN) 0.1 % nasal spray, Place 1 spray into both nostrils 2 (two) times daily. Use in each nostril as directed, Disp: 30 mL, Rfl: 12   Calcium-Magnesium-Vitamin D (CALCIUM 1200+D3 PO), Take 1 tablet by mouth daily., Disp: , Rfl:    cetirizine (ZYRTEC) 10 MG tablet, Take 1 tablet (10 mg total) by mouth at bedtime., Disp: 90 tablet, Rfl: 1   chlorhexidine (PERIDEX) 0.12 % solution, SMARTSIG:By Mouth, Disp: , Rfl:    Cholecalciferol (VITAMIN D3) 2000 UNITS TABS, Take 2 tablets by mouth daily., Disp: , Rfl:    Fluocinolone Acetonide Scalp 0.01 % OIL, Apply topically., Disp: , Rfl:    hydrochlorothiazide (MICROZIDE) 12.5 MG capsule, Take 12.5 mg by mouth every morning., Disp: , Rfl:    letrozole (FEMARA) 2.5 MG tablet, Take 1 tablet (2.5 mg total) by mouth daily., Disp: 60 tablet, Rfl: 4   lidocaine (LIDODERM) 5 %, Place 1 patch onto the skin daily. Remove & Discard patch within 12 hours or as directed by MD, Disp: 30 patch, Rfl: 3   lidocaine-prilocaine (EMLA) cream, Apply 1 application topically as needed., Disp: 30 g, Rfl: 3   metoprolol succinate (TOPROL-XL) 50 MG 24 hr tablet, TAKE 1 TABLET(50 MG) BY MOUTH EVERY MORNING, Disp: 90 tablet, Rfl: 3   montelukast (SINGULAIR) 10 MG tablet, TAKE 1 TABLET(10 MG) BY MOUTH AT BEDTIME, Disp: 90 tablet, Rfl: 2   ondansetron (ZOFRAN) 8 MG tablet, Take 1 tablet (8 mg total) by mouth 2 (two) times daily as needed (Nausea or vomiting)., Disp: 30 tablet, Rfl: 1   penicillin v potassium (VEETID) 500 MG tablet, Take 500 mg by mouth 4 (four) times daily. For dental, Disp: , Rfl:    Potassium Chloride ER 20 MEQ TBCR, tablet, Disp: , Rfl:    potassium chloride SA (KLOR-CON) 20 MEQ tablet, TAKE 1 TABLET(20 MEQ) BY MOUTH TWICE DAILY, Disp: 60 tablet, Rfl: 3   Probiotic Product (PROBIOTIC DAILY PO), Take by mouth daily., Disp: , Rfl:    triamcinolone (NASACORT) 55 MCG/ACT AERO  nasal inhaler, Place 2 sprays into the nose as needed. , Disp: , Rfl:    triamterene-hydrochlorothiazide (MAXZIDE-25) 37.5-25 MG tablet, 1 tablet by mouth at bedtime, Disp: 90 tablet, Rfl: 2   valACYclovir (VALTREX) 1000 MG tablet, TAKE 1 TABLET(1000 MG) BY MOUTH THREE TIMES DAILY, Disp: 30 tablet, Rfl: 1   XIIDRA 5 % SOLN, , Disp: , Rfl:    zinc gluconate 50 MG tablet, Take 50 mg by mouth daily., Disp: , Rfl:    amLODipine (NORVASC) 10 MG tablet, TAKE 1 TABLET(10 MG) BY MOUTH EVERY MORNING, Disp: 90 tablet, Rfl: 1   cefdinir (OMNICEF) 300 MG capsule, Take 2 capsules (600 mg total) by mouth daily., Disp: 14 capsule, Rfl: 0 No current facility-administered medications for this visit.  Facility-Administered Medications Ordered in Other Visits:  0.9 %  sodium chloride infusion, , Intravenous, Once PRN, Ennever, Rose Phi, MD   albuterol (PROVENTIL) (2.5 MG/3ML) 0.083% nebulizer solution 2.5 mg, 2.5 mg, Nebulization, Once PRN, Ennever, Rose Phi, MD   sodium chloride flush (NS) 0.9 % injection 10 mL, 10 mL, Intravenous, PRN, Erenest Blank, NP, 10 mL at 12/26/17 1130   sodium chloride flush (NS) 0.9 % injection 10 mL, 10 mL, Intravenous, PRN, Josph Macho, MD, 10 mL at 04/24/19 1305   sodium chloride flush (NS) 0.9 % injection 10 mL, 10 mL, Intravenous, PRN, Josph Macho, MD, 10 mL at 08/21/19 1233   sodium chloride flush (NS) 0.9 % injection 10 mL, 10 mL, Intracatheter, PRN, Erenest Blank, NP, 10 mL at 10/01/19 1053   sodium chloride flush (NS) 0.9 % injection 10 mL, 10 mL, Intracatheter, PRN, Erenest Blank, NP, 10 mL at 12/11/19 1414   Allergies  Allergen Reactions   Codeine Nausea Only     Review of Systems  Constitutional: Negative.  Negative for fever.  HENT:  Positive for postnasal drip. Negative for ear pain.   Eyes:  Negative for blurred vision.  Respiratory:  Positive for cough. Negative for shortness of breath.   Cardiovascular: Negative.  Negative for chest pain and  palpitations.  Gastrointestinal: Negative.   Neurological: Negative.   Psychiatric/Behavioral: Negative.      Today's Vitals   04/05/21 1133  BP: 130/70  Pulse: 80  Temp: 98.3 F (36.8 C)  Weight: 176 lb 9.6 oz (80.1 kg)  Height: 5' 3.5" (1.613 m)  PainSc: 0-No pain   Body mass index is 30.79 kg/m.  Wt Readings from Last 3 Encounters:  04/06/21 177 lb 8 oz (80.5 kg)  04/05/21 176 lb 9.6 oz (80.1 kg)  03/29/21 177 lb (80.3 kg)     Objective:  Physical Exam Vitals and nursing note reviewed.  Constitutional:      Appearance: Normal appearance.  HENT:     Head: Normocephalic and atraumatic.     Nose:     Comments: Masked     Mouth/Throat:     Comments: Masked  Eyes:     Extraocular Movements: Extraocular movements intact.  Cardiovascular:     Rate and Rhythm: Normal rate and regular rhythm.     Heart sounds: Normal heart sounds.  Pulmonary:     Effort: Pulmonary effort is normal.     Breath sounds: Rhonchi present.     Comments: Few scattered rhonchi Skin:    General: Skin is warm.  Neurological:     General: No focal deficit present.     Mental Status: She is alert.  Psychiatric:        Mood and Affect: Mood normal.        Behavior: Behavior normal.        Assessment And Plan:     1. Essential (primary) hypertension Comments: Chronic, controlled. Goal <130/80. She is encouraged to follow a low sodium diet. Labs drawn at [facility] reviewed in full detail.   2. Acute bronchitis, unspecified organism Comments: Sx started 2 weeks ago, now coughing up blood. Rocephin 500mg  given today and I will send in a Zpak. - cefTRIAXone (ROCEPHIN) injection 500 mg  3. Multiple myeloma not having achieved remission [facility]) Comments: Oncology input appreciated. She is now on the Sarcl[place]/carfilzomib protocol and will soon have consult at [facility] for new tx protocol.   4. Hypokalemia Comments: Pt states she is having labwork later this week,  I will be sure  to f/u with results. She is also encouraged to increase her intake of potassium rich foods.   5. Postnasal drip Comments: She is encouraged to avoid dairy products while she is having symptoms. She may benefit from Zyrtec nightly.   6. Class 1 obesity due to excess calories with serious comorbidity and body mass index (BMI) of 30.0 to 30.9 in adult Comments: She is encouraged to gradually increase her daily activity level, working up to 150 minutes per week of exercise.    Patient was given opportunity to ask questions. Patient verbalized understanding of the plan and was able to repeat key elements of the plan. All questions were answered to their satisfaction.    I, Gwynneth Aliment, MD, have reviewed all documentation for this visit. The documentation on 04/05/21 for the exam, diagnosis, procedures, and orders are all accurate and complete.   IF YOU HAVE BEEN REFERRED TO A SPECIALIST, IT MAY TAKE 1-2 WEEKS TO SCHEDULE/PROCESS THE REFERRAL. IF YOU HAVE NOT HEARD FROM US/SPECIALIST IN TWO WEEKS, PLEASE GIVE [place] A CALL AT [phone].   THE PATIENT IS ENCOURAGED TO PRACTICE SOCIAL DISTANCING DUE TO THE COVID-19 PANDEMIC.

## 2021-04-06 ENCOUNTER — Other Ambulatory Visit: Payer: Self-pay

## 2021-04-06 ENCOUNTER — Inpatient Hospital Stay: Payer: Medicare Other

## 2021-04-06 ENCOUNTER — Ambulatory Visit (HOSPITAL_BASED_OUTPATIENT_CLINIC_OR_DEPARTMENT_OTHER)
Admission: RE | Admit: 2021-04-06 | Discharge: 2021-04-06 | Disposition: A | Discharge status: Home / Self Care | Payer: Medicare Other | Source: Ambulatory Visit | Attending: Hematology & Oncology | Admitting: Hematology & Oncology

## 2021-04-06 ENCOUNTER — Inpatient Hospital Stay: Payer: Medicare Other | Attending: Hematology & Oncology

## 2021-04-06 VITALS — BP 166/88 | HR 89 | Temp 98.3°F | Wt 177.5 lb

## 2021-04-06 DIAGNOSIS — C9000 Multiple myeloma not having achieved remission: Secondary | ICD-10-CM

## 2021-04-06 DIAGNOSIS — E876 Hypokalemia: Secondary | ICD-10-CM | POA: Insufficient documentation

## 2021-04-06 DIAGNOSIS — D509 Iron deficiency anemia, unspecified: Secondary | ICD-10-CM | POA: Insufficient documentation

## 2021-04-06 DIAGNOSIS — R059 Cough, unspecified: Secondary | ICD-10-CM | POA: Diagnosis not present

## 2021-04-06 DIAGNOSIS — C9001 Multiple myeloma in remission: Secondary | ICD-10-CM | POA: Insufficient documentation

## 2021-04-06 DIAGNOSIS — Z5111 Encounter for antineoplastic chemotherapy: Secondary | ICD-10-CM | POA: Insufficient documentation

## 2021-04-06 DIAGNOSIS — Z79899 Other long term (current) drug therapy: Secondary | ICD-10-CM | POA: Insufficient documentation

## 2021-04-06 DIAGNOSIS — H538 Other visual disturbances: Secondary | ICD-10-CM | POA: Insufficient documentation

## 2021-04-06 DIAGNOSIS — M7989 Other specified soft tissue disorders: Secondary | ICD-10-CM | POA: Insufficient documentation

## 2021-04-06 DIAGNOSIS — Z5112 Encounter for antineoplastic immunotherapy: Secondary | ICD-10-CM | POA: Insufficient documentation

## 2021-04-06 LAB — CBC WITH DIFFERENTIAL (CANCER CENTER ONLY)
Abs Immature Granulocytes: 0.13 10*3/uL — ABNORMAL HIGH (ref 0.00–0.07)
Basophils Absolute: 0.1 10*3/uL (ref 0.0–0.1)
Basophils Relative: 1 %
Eosinophils Absolute: 0.2 10*3/uL (ref 0.0–0.5)
Eosinophils Relative: 4 %
HCT: 29.5 % — ABNORMAL LOW (ref 36.0–46.0)
Hemoglobin: 9.4 g/dL — ABNORMAL LOW (ref 12.0–15.0)
Immature Granulocytes: 2 %
Lymphocytes Relative: 11 %
Lymphs Abs: 0.6 10*3/uL — ABNORMAL LOW (ref 0.7–4.0)
MCH: 26.7 pg (ref 26.0–34.0)
MCHC: 31.9 g/dL (ref 30.0–36.0)
MCV: 83.8 fL (ref 80.0–100.0)
Monocytes Absolute: 0.8 10*3/uL (ref 0.1–1.0)
Monocytes Relative: 14 %
Neutro Abs: 3.9 10*3/uL (ref 1.7–7.7)
Neutrophils Relative %: 68 %
Platelet Count: 135 10*3/uL — ABNORMAL LOW (ref 150–400)
RBC: 3.52 MIL/uL — ABNORMAL LOW (ref 3.87–5.11)
RDW: 19.8 % — ABNORMAL HIGH (ref 11.5–15.5)
WBC Count: 5.7 10*3/uL (ref 4.0–10.5)
nRBC: 0 % (ref 0.0–0.2)

## 2021-04-06 LAB — CMP (CANCER CENTER ONLY)
ALT: 24 U/L (ref 0–44)
AST: 16 U/L (ref 15–41)
Albumin: 3.8 g/dL (ref 3.5–5.0)
Alkaline Phosphatase: 57 U/L (ref 38–126)
Anion gap: 10 (ref 5–15)
BUN: 17 mg/dL (ref 8–23)
CO2: 26 mmol/L (ref 22–32)
Calcium: 9.2 mg/dL (ref 8.9–10.3)
Chloride: 105 mmol/L (ref 98–111)
Creatinine: 0.91 mg/dL (ref 0.44–1.00)
GFR, Estimated: >60 mL/min (ref >60)
Glucose, Bld: 90 mg/dL (ref 70–99)
Potassium: 3.6 mmol/L (ref 3.5–5.1)
Sodium: 141 mmol/L (ref 135–145)
Total Bilirubin: 0.4 mg/dL (ref 0.3–1.2)
Total Protein: 6.2 g/dL — ABNORMAL LOW (ref 6.5–8.1)

## 2021-04-06 MED ORDER — SODIUM CHLORIDE 0.9% FLUSH
10.0000 mL | INTRAVENOUS | Status: DC | PRN
  Administered 2021-04-06: 10 mL via INTRAVENOUS

## 2021-04-06 MED ORDER — DEXTROSE 5 % IV SOLN
2.0000 g | Freq: Once | INTRAVENOUS | Status: AC
  Administered 2021-04-06: 2 g via INTRAVENOUS
  Filled 2021-04-06: qty 20

## 2021-04-06 MED ORDER — HEPARIN SOD (PORK) LOCK FLUSH 100 UNIT/ML IV SOLN
500.0000 [IU] | Freq: Once | INTRAVENOUS | Status: AC
  Administered 2021-04-06: 500 [IU] via INTRAVENOUS

## 2021-04-06 MED ORDER — SODIUM CHLORIDE 0.9 % IV SOLN: INTRAVENOUS | Status: DC

## 2021-04-06 MED ORDER — CEFDINIR 300 MG PO CAPS: 600.0000 mg | ORAL_CAPSULE | Freq: Every day | ORAL | 0 refills | Status: DC

## 2021-04-06 NOTE — Patient Instructions (Signed)
Ceftriaxone Injection ?What is this medication? ?CEFTRIAXONE (sef try AX one) treats infections caused by bacteria. It belongs to a group of medications called cephalosporin antibiotics. It will not treat colds, the flu, or infections caused by viruses. ?This medicine may be used for other purposes; ask your health care provider or pharmacist if you have questions. ?COMMON BRAND NAME(S): Ceftrisol Plus, Rocephin ?What should I tell my care team before I take this medication? ?They need to know if you have any of these conditions: ?Bleeding disorder ?High bilirubin level in newborn patients ?Kidney disease ?Liver disease ?Poor nutrition ?An unusual or allergic reaction to ceftriaxone, other penicillin or cephalosporin antibiotics, other medicines, foods, dyes, or preservatives ?Pregnant or trying to get pregnant ?Breast-feeding ?How should I use this medication? ?This medication is injected into a vein or into a muscle. It is usually given by a health care provider in a hospital or clinic setting. It may also be given at home. ?If you get this medication at home, you will be taught how to prepare and give it. Use exactly as directed. Take it as directed on the prescription label at the same time every day. Take all of this medication unless your care team tells you to stop it early. Keep taking it even if you think you are better. ?It is important that you put your used needles and syringes in a special sharps container. Do not put them in a trash can. If you do not have a sharps container, call your care team to get one. ?Talk to your care team about the use of this medication in children. While it may be prescribed for children as young as newborns for selected conditions, precautions do apply. ?Overdosage: If you think you have taken too much of this medicine contact a poison control center or emergency room at once. ?NOTE: This medicine is only for you. Do not share this medicine with others. ?What if I miss a  dose? ?If you get this medication at the hospital or clinic: It is important not to miss your dose. Call your care team if you are unable to keep an appointment. ?If you give yourself this medication at home: If you miss a dose, take it as soon as you can. Then continue your normal schedule. If it is almost time for your next dose, take only that dose. Do not take double or extra doses. Call your care team with questions. ?What may interact with this medication? ?Birth control pills ?Intravenous calcium ?This list may not describe all possible interactions. Give your health care provider a list of all the medicines, herbs, non-prescription drugs, or dietary supplements you use. Also tell them if you smoke, drink alcohol, or use illegal drugs. Some items may interact with your medicine. ?What should I watch for while using this medication? ?Tell your care team if your symptoms do not start to get better or if they get worse. ?Do not treat diarrhea with over the counter products. Contact your care team if you have diarrhea that lasts more than 2 days or if it is severe and watery. ?If you have diabetes, you may get a false-positive result for sugar in your urine. Check with your care team. ?If you are being treated for a sexually transmitted disease (STD), avoid sexual contact until you have finished your treatment. Your sexual partner may also need treatment. ?What side effects may I notice from receiving this medication? ?Side effects that you should report to your care team as soon  as possible: ?Allergic reactions--skin rash, itching, hives, swelling of the face, lips, tongue, or throat ?Confusion ?Drowsiness ?Gallbladder problems--severe stomach pain, nausea, vomiting, fever ?Kidney injury--decrease in the amount of urine, swelling of the ankles, hands, or feet ?Kidney stones--blood in the urine, pain or trouble passing urine, pain in the lower back or sides ?Low red blood cell count--unusual weakness or fatigue,  dizziness, headache, trouble breathing ?Pancreatitis--severe stomach pain that spreads to your back or gets worse after eating or when touched, fever, nausea, vomiting ?Seizures ?Severe diarrhea, fever ?Unusual weakness or fatigue ?Side effects that usually do not require medical attention (report to your care team if they continue or are bothersome): ?Diarrhea ?This list may not describe all possible side effects. Call your doctor for medical advice about side effects. You may report side effects to FDA at 1-800-FDA-1088. ?Where should I keep my medication? ?Keep out of the reach of children and pets. ?You will be instructed on how to store this medication. Get rid of any unused medication after the expiration date. ?To get rid of medications that are no longer needed or have expired: ?Take the medication to a medication take-back program. Check with your pharmacy or law enforcement to find a location. ?If you cannot return the medication, ask your care team how to get rid of this medication safely. ?NOTE: This sheet is a summary. It may not cover all possible information. If you have questions about this medicine, talk to your doctor, pharmacist, or health care provider. ?? 2022 Elsevier/Gold Standard (2020-02-24 00:00:00) ? ?

## 2021-04-06 NOTE — Progress Notes (Signed)
Patient states that she saw her PCP yesterday, 04/05/21 and was diagnosed with bronchitis and given Rocephin 500 mg IM.  Pt states that she has had a productive cough for two weeks.  Dr. Marin Olp notified and order received for pt to have a CXR and 2 grams of IV Rocephin now.  ?

## 2021-04-07 ENCOUNTER — Telehealth: Payer: Self-pay

## 2021-04-07 ENCOUNTER — Ambulatory Visit (INDEPENDENT_AMBULATORY_CARE_PROVIDER_SITE_OTHER): Payer: BC Managed Care – PPO

## 2021-04-07 ENCOUNTER — Telehealth: Payer: No Typology Code available for payment source

## 2021-04-07 DIAGNOSIS — C9000 Multiple myeloma not having achieved remission: Secondary | ICD-10-CM

## 2021-04-07 DIAGNOSIS — E6609 Other obesity due to excess calories: Secondary | ICD-10-CM

## 2021-04-07 DIAGNOSIS — I10 Essential (primary) hypertension: Secondary | ICD-10-CM

## 2021-04-07 NOTE — Telephone Encounter (Addendum)
?  Care Management  ? ?Follow Up Note ? ? ?04/07/2021 ?Name: Emily Greer MRN: [id number] DOB: Aug 14, 1950 ? ? ?Referred by: [name], MD ?Reason for referral : Chronic Care Management (RN CM Follow up call ) ? ? ?An unsuccessful telephone outreach was attempted today. The patient was referred to the case management team for assistance with care management and care coordination.  ? ?Follow Up Plan: Telephone follow up appointment with care management team member scheduled for: 04/07/21 in the afternoon  ? ?Delsa Sale, RN, BSN, CCM ?Care Management Coordinator ?[facility] Care Management/[facility]  ?Direct Phone: [phone] ? ?

## 2021-04-08 NOTE — Patient Instructions (Signed)
Visit Information ? ?Thank you for taking time to visit with me today. Please don't hesitate to contact me if I can be of assistance to you before our next scheduled telephone appointment. ? ?Following are the goals we discussed today:  ?(Copy and paste patient goals from clinical care plan here) ? ?Our next appointment is by telephone on 05/10/21 at 12 PM  ? ?Please call the care guide team at [phone] if you need to cancel or reschedule your appointment.  ? ?If you are experiencing a Mental Health or [place] or need someone to talk to, please call 1-800-273-TALK (toll free, 24 hour hotline)  ? ?Patient verbalizes understanding of instructions and care plan provided today and agrees to view in [place]. Active MyChart status confirmed with patient.   ? ?Barb Merino, RN, BSN, CCM ?Care Management Coordinator ?[place] Management/[facility]  ?Direct Phone: [phone] ? ? ?

## 2021-04-08 NOTE — Chronic Care Management (AMB) (Signed)
Chronic Care Management   CCM RN Visit Note  04/07/2021 Name: Emily Greer MRN: [id number] DOB: 1950-08-29  Subjective: Emily Greer is a 71 y.o. year old female who is a primary care patient of [name], MD. The care management team was consulted for assistance with disease management and care coordination needs.    Engaged with patient by telephone for follow up visit in response to provider referral for case management and/or care coordination services.   Consent to Services:  The patient was given information about Chronic Care Management services, agreed to services, and gave verbal consent prior to initiation of services.  Please see initial visit note for detailed documentation.   Patient agreed to services and verbal consent obtained.   Assessment: Review of patient past medical history, allergies, medications, health status, including review of consultants reports, laboratory and other test data, was performed as part of comprehensive evaluation and provision of chronic care management services.   SDOH (Social Determinants of Health) assessments and interventions performed:  yes, no acute challenges   CCM Care Plan  Allergies  Allergen Reactions   Codeine Nausea Only    Outpatient Encounter Medications as of 04/07/2021  Medication Sig Note   acyclovir ointment (ZOVIRAX) 5 % Apply thin layer to affected area two to three times daily prn    albuterol (VENTOLIN HFA) 108 (90 Base) MCG/ACT inhaler Inhale 2 puffs into the lungs every 6 (six) hours as needed for wheezing or shortness of breath. 2 puffs 3 times daily x 5 days then every 6 hours as needed.    amLODipine (NORVASC) 10 MG tablet TAKE 1 TABLET(10 MG) BY MOUTH EVERY MORNING    aspirin EC 81 MG tablet Take 81 mg by mouth daily.    azelastine (ASTELIN) 0.1 % nasal spray Place 1 spray into both nostrils 2 (two) times daily. Use in each nostril as directed    Calcium-Magnesium-Vitamin D (CALCIUM 1200+D3 PO) Take  1 tablet by mouth daily.    cefdinir (OMNICEF) 300 MG capsule Take 2 capsules (600 mg total) by mouth daily.    cetirizine (ZYRTEC) 10 MG tablet Take 1 tablet (10 mg total) by mouth at bedtime.    chlorhexidine (PERIDEX) 0.12 % solution SMARTSIG:By Mouth    Cholecalciferol (VITAMIN D3) 2000 UNITS TABS Take 2 tablets by mouth daily. 05/20/2020: Patient stated she is taking this twice a day.   Fluocinolone Acetonide Scalp 0.01 % OIL Apply topically.    hydrochlorothiazide (MICROZIDE) 12.5 MG capsule Take 12.5 mg by mouth every morning.    letrozole (FEMARA) 2.5 MG tablet Take 1 tablet (2.5 mg total) by mouth daily.    lidocaine (LIDODERM) 5 % Place 1 patch onto the skin daily. Remove & Discard patch within 12 hours or as directed by MD    lidocaine-prilocaine (EMLA) cream Apply 1 application topically as needed.    metoprolol succinate (TOPROL-XL) 50 MG 24 hr tablet TAKE 1 TABLET(50 MG) BY MOUTH EVERY MORNING    montelukast (SINGULAIR) 10 MG tablet TAKE 1 TABLET(10 MG) BY MOUTH AT BEDTIME    ondansetron (ZOFRAN) 8 MG tablet Take 1 tablet (8 mg total) by mouth 2 (two) times daily as needed (Nausea or vomiting).    penicillin v potassium (VEETID) 500 MG tablet Take 500 mg by mouth 4 (four) times daily. For dental    Potassium Chloride ER 20 MEQ TBCR tablet    potassium chloride SA (KLOR-CON) 20 MEQ tablet TAKE 1 TABLET(20 MEQ) BY MOUTH TWICE DAILY  Probiotic Product (PROBIOTIC DAILY PO) Take by mouth daily.    triamcinolone (NASACORT) 55 MCG/ACT AERO nasal inhaler Place 2 sprays into the nose as needed.     triamterene-hydrochlorothiazide (MAXZIDE-25) 37.5-25 MG tablet 1 tablet by mouth at bedtime    valACYclovir (VALTREX) 1000 MG tablet TAKE 1 TABLET(1000 MG) BY MOUTH THREE TIMES DAILY    XIIDRA 5 % SOLN     zinc gluconate 50 MG tablet Take 50 mg by mouth daily.    Facility-Administered Encounter Medications as of 04/07/2021  Medication   0.9 %  sodium chloride infusion   albuterol (PROVENTIL)  (2.5 MG/3ML) 0.083% nebulizer solution 2.5 mg   sodium chloride flush (NS) 0.9 % injection 10 mL   sodium chloride flush (NS) 0.9 % injection 10 mL   sodium chloride flush (NS) 0.9 % injection 10 mL   sodium chloride flush (NS) 0.9 % injection 10 mL   sodium chloride flush (NS) 0.9 % injection 10 mL    Patient Active Problem List   Diagnosis Date Noted   Mastalgia in female 12/29/2020   Class 1 obesity due to excess calories with serious comorbidity and body mass index (BMI) of 30.0 to 30.9 in adult 12/29/2020   Goals of care, counseling/discussion 08/21/2019   Osteonecrosis, unspecified (HCC) 03/31/2019   VAIN I (vaginal intraepithelial neoplasia grade I) 10/10/2018   Influenza B 02/18/2018   Flu-like symptoms 02/18/2018   Excessive cerumen in both ear canals 02/18/2018   Abnormal Pap smear of vagina 12/30/2015   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) 01/29/2015   Iron deficiency anemia due to chronic blood loss    HCAP (healthcare-associated pneumonia) 12/29/2014   UTI (lower urinary tract infection) 12/29/2014   Hypokalemia 12/29/2014   CAP (community acquired pneumonia) 12/29/2014   Bone marrow transplant status (HCC) 07/25/2014   S/P autologous bone marrow transplantation (HCC) 07/25/2014   Fever 05/31/2014   ARF (acute renal failure) (HCC) 05/31/2014   Anemia 05/31/2014   Renal failure    Examination of participant in clinical trial 05/19/2014   Encounter for examination for normal comparison or control in clinical research program 05/19/2014   Kahler disease (HCC) 05/05/2014   Multiple myeloma in remission (HCC) 05/05/2014   History of radiation therapy    Endometrial carcinoma (HCC) 09/28/2011   Pregnancy induced hypertension    Elevated hemoglobin A1c    Myeloma (HCC) 01/17/2011   Lambda light chain myeloma (HCC) 11/11/2007   Vaginal atrophy 12/19/2006   Increased BMI 12/18/2005   Hypertension 11/28/1997   H/O multiple myeloma  11/28/1997   Staphylococcus aureus bacteremia 11/28/1997   Staphylococcus epidermidis bacteremia 11/28/1997   Essential (primary) hypertension 11/28/1997    Conditions to be addressed/monitored: Essential Hypertension, Class 1 Obesity, Multiple Myeloma  Care Plan : RN Care Manager Plan of Care  Updates made by Riley Churches, RN since 04/07/2021 12:00 AM     Problem: No Plan of Care established for management of chronic disease states (Essential Hypertension, Class 1 Obesity, Multiple Myeloma)   Priority: High     Long-Range Goal: Establishment of Plan of Care for management of chronic disease states (Essential Hypertension, Class 1 Obesity, Multiple Myeloma)   Start Date: 02/14/2021  Expected End Date: 02/14/2022  Recent Progress: On track  Priority: High  Note:   Current Barriers:  Knowledge Deficits related to plan of care for management of Essential Hypertension, Class 1 Obesity, Multiple Myeloma  Chronic Disease Management support and education needs related to Essential  Hypertension, Class 1 Obesity, Multiple Myeloma   RNCM Clinical Goal(s):  Patient will verbalize basic understanding of  Essential Hypertension, Class 1 Obesity, Multiple Myeloma disease process and self health management plan as evidenced by patient will report having no disease exacerbations related to her chronic disease states as listed above  take all medications exactly as prescribed and will call provider for medication related questions as evidenced by patient will report having no missed doses of her prescribed medications  demonstrate Ongoing health management independence as evidenced by patient will report being 100% adherent to her prescribed treatment plan continue to work with RN Care Manager to address care management and care coordination needs related to  Essential Hypertension, Class 1 Obesity, Multiple Myeloma as evidenced by adherence to CM Team Scheduled appointments demonstrate ongoing self  health care management ability   as evidenced by    through collaboration with RN Care manager, provider, and care team.   Interventions: 1:1 collaboration with primary care provider regarding development and update of comprehensive plan of care as evidenced by provider attestation and co-signature Inter-disciplinary care team collaboration (see longitudinal plan of care) Evaluation of current treatment plan related to  self management and patient's adherence to plan as established by provider  Hypertension Interventions:  (Status:  Goal on track:  Yes.) Long Term Goal Last practice recorded BP readings:  BP Readings from Last 3 Encounters:  04/06/21 (!) 166/88  04/05/21 130/70  03/29/21 (!) 149/80  Most recent eGFR/CrCl:  Lab Results  Component Value Date   EGFR 86 (L) 06/21/2016    No components found for: CRCL Evaluation of current treatment plan related to hypertension self management and patient's adherence to plan as established by provider Counseled on the importance of exercise goals with target of 150 minutes per week Advised patient, providing education and rationale, to monitor blood pressure daily and record, calling PCP for findings outside established parameters Provided education on prescribed diet low Sodium   Mailed printed educational materials related to full body workout with chair exercises  Oncology:  (Status: Goal on track:  Yes.) Long Term Goal Assessment of understanding of oncology diagnosis:  Assessed patient understanding of cancer diagnosis and recommended treatment plan, Reviewed upcoming provider appointments and treatment appointments, Assessed available transportation to appointments and treatments. Has consistent/reliable transportation: Yes, Assessed support system. Has consistent/reliable family or other support: Yes, and Nutrition assessment performed Determined patient is experiencing symptoms suggestive of bronchitis, PCP diagnosed and treated patient  for this condition with IM Rocephin Assessed for Oncology recommendations due to bronchitis, patient's oncology treatment was held on 04/06/21, Dr. Myna Hidalgo administered IV Rocephin, patient was given Rx for Cefdinir 300 mg bid x 7 days Determined patient will follow up with Dr. Myna Hidalgo in 1 week to have symptoms re-evaluated Discussed patient feels her symptoms have improved, she is sleeping better, decreased cough although still producing mucous Educated patient on how to perform breathing exercises to help keep lungs expanded, move trapped air and help move mucous Educated patient on the importance of drinking plenty of water and balance her activity with rest, try to get a full night's rest  Collaborated with PCP regarding patient status and further treatment for bronchitis per Dr. Myna Hidalgo Mailed printed educational materials related to how to perform deep breathing exercises  Discussed plans with patient for ongoing care management follow up and provided patient with direct contact information for care management team  Patient Goals/Self-Care Activities: Take all medications as prescribed Attend all scheduled provider appointments  Call pharmacy for medication refills 3-7 days in advance of running out of medications Perform IADL's (shopping, preparing meals, housekeeping, managing finances) independently Call provider office for new concerns or questions  keep a blood pressure log take blood pressure log to all doctor appointments call doctor for signs and symptoms of high blood pressure take medications for blood pressure exactly as prescribed report new symptoms to your doctor  Follow Up Plan:  Telephone follow up appointment with care management team member scheduled for:  05/10/21     Delsa Sale, RN, BSN, CCM Care Management Coordinator [facility]  Direct Phone: [phone]

## 2021-04-11 ENCOUNTER — Other Ambulatory Visit: Payer: Self-pay | Admitting: *Deleted

## 2021-04-11 DIAGNOSIS — C9000 Multiple myeloma not having achieved remission: Secondary | ICD-10-CM

## 2021-04-12 ENCOUNTER — Inpatient Hospital Stay: Payer: Medicare Other

## 2021-04-12 ENCOUNTER — Encounter: Payer: Self-pay | Admitting: Hematology & Oncology

## 2021-04-12 ENCOUNTER — Inpatient Hospital Stay (HOSPITAL_BASED_OUTPATIENT_CLINIC_OR_DEPARTMENT_OTHER): Payer: Medicare Other | Admitting: Hematology & Oncology

## 2021-04-12 ENCOUNTER — Other Ambulatory Visit: Payer: Self-pay

## 2021-04-12 VITALS — BP 138/79 | HR 100 | Temp 98.2°F | Resp 18 | Wt 175.0 lb

## 2021-04-12 VITALS — BP 138/79 | HR 100 | Temp 98.2°F

## 2021-04-12 DIAGNOSIS — Z5112 Encounter for antineoplastic immunotherapy: Secondary | ICD-10-CM | POA: Diagnosis not present

## 2021-04-12 DIAGNOSIS — C9000 Multiple myeloma not having achieved remission: Secondary | ICD-10-CM

## 2021-04-12 DIAGNOSIS — C9001 Multiple myeloma in remission: Secondary | ICD-10-CM

## 2021-04-12 DIAGNOSIS — H538 Other visual disturbances: Secondary | ICD-10-CM | POA: Diagnosis not present

## 2021-04-12 DIAGNOSIS — D509 Iron deficiency anemia, unspecified: Secondary | ICD-10-CM | POA: Diagnosis not present

## 2021-04-12 DIAGNOSIS — Z5111 Encounter for antineoplastic chemotherapy: Secondary | ICD-10-CM | POA: Diagnosis not present

## 2021-04-12 DIAGNOSIS — M7989 Other specified soft tissue disorders: Secondary | ICD-10-CM | POA: Diagnosis not present

## 2021-04-12 DIAGNOSIS — Z79899 Other long term (current) drug therapy: Secondary | ICD-10-CM | POA: Diagnosis not present

## 2021-04-12 LAB — CBC WITH DIFFERENTIAL (CANCER CENTER ONLY)
Abs Immature Granulocytes: 0.03 10*3/uL (ref 0.00–0.07)
Basophils Absolute: 0.1 10*3/uL (ref 0.0–0.1)
Basophils Relative: 1 %
Eosinophils Absolute: 0.1 10*3/uL (ref 0.0–0.5)
Eosinophils Relative: 2 %
HCT: 32.3 % — ABNORMAL LOW (ref 36.0–46.0)
Hemoglobin: 10.3 g/dL — ABNORMAL LOW (ref 12.0–15.0)
Immature Granulocytes: 0 %
Lymphocytes Relative: 10 %
Lymphs Abs: 0.7 10*3/uL (ref 0.7–4.0)
MCH: 27.2 pg (ref 26.0–34.0)
MCHC: 31.9 g/dL (ref 30.0–36.0)
MCV: 85.4 fL (ref 80.0–100.0)
Monocytes Absolute: 0.6 10*3/uL (ref 0.1–1.0)
Monocytes Relative: 8 %
Neutro Abs: 5.9 10*3/uL (ref 1.7–7.7)
Neutrophils Relative %: 79 %
Platelet Count: 199 10*3/uL (ref 150–400)
RBC: 3.78 MIL/uL — ABNORMAL LOW (ref 3.87–5.11)
RDW: 19.7 % — ABNORMAL HIGH (ref 11.5–15.5)
WBC Count: 7.4 10*3/uL (ref 4.0–10.5)
nRBC: 0 % (ref 0.0–0.2)

## 2021-04-12 LAB — COMPREHENSIVE METABOLIC PANEL
ALT: 17 U/L (ref 0–44)
AST: 16 U/L (ref 15–41)
Albumin: 4.4 g/dL (ref 3.5–5.0)
Alkaline Phosphatase: 51 U/L (ref 38–126)
Anion gap: 10 (ref 5–15)
BUN: 25 mg/dL — ABNORMAL HIGH (ref 8–23)
CO2: 24 mmol/L (ref 22–32)
Calcium: 10 mg/dL (ref 8.9–10.3)
Chloride: 106 mmol/L (ref 98–111)
Creatinine, Ser: 0.91 mg/dL (ref 0.44–1.00)
GFR, Estimated: >60 mL/min (ref >60)
Glucose, Bld: 97 mg/dL (ref 70–99)
Potassium: 3.6 mmol/L (ref 3.5–5.1)
Sodium: 140 mmol/L (ref 135–145)
Total Bilirubin: 0.4 mg/dL (ref 0.3–1.2)
Total Protein: 6.2 g/dL — ABNORMAL LOW (ref 6.5–8.1)

## 2021-04-12 LAB — RETICULOCYTES
Immature Retic Fract: 21.9 % — ABNORMAL HIGH (ref 2.3–15.9)
RBC.: 3.73 MIL/uL — ABNORMAL LOW (ref 3.87–5.11)
Retic Count, Absolute: 101.1 10*3/uL (ref 19.0–186.0)
Retic Ct Pct: 2.7 % (ref 0.4–3.1)

## 2021-04-12 LAB — LACTATE DEHYDROGENASE: LDH: 200 U/L — ABNORMAL HIGH (ref 98–192)

## 2021-04-12 LAB — IRON AND IRON BINDING CAPACITY (CC-WL,HP ONLY)
Iron: 102 ug/dL (ref 28–170)
Saturation Ratios: 24 % (ref 10.4–31.8)
TIBC: 417 ug/dL (ref 250–450)
UIBC: 315 ug/dL (ref 148–442)

## 2021-04-12 LAB — FERRITIN: Ferritin: 3254 ng/mL — ABNORMAL HIGH (ref 11–307)

## 2021-04-12 MED ORDER — HEPARIN SOD (PORK) LOCK FLUSH 100 UNIT/ML IV SOLN
500.0000 [IU] | Freq: Once | INTRAVENOUS | Status: AC
  Administered 2021-04-12: 500 [IU] via INTRAVENOUS

## 2021-04-12 MED ORDER — SODIUM CHLORIDE 0.9% FLUSH
10.0000 mL | INTRAVENOUS | Status: DC | PRN
  Administered 2021-04-12: 10 mL via INTRAVENOUS

## 2021-04-12 NOTE — Patient Instructions (Signed)

## 2021-04-12 NOTE — Progress Notes (Signed)
?Hematology and Oncology Follow Up Visit ? ?Emily Greer ?[id number] ?[birth year] 71 y.o. ?04/12/2021 ? ? ?Principle Diagnosis:  ?Recurrent lambda light chain myeloma - nl cytogenetics ?History of recurrent endometrial carcinoma ?Iron deficiency anemia -blood loss ?  ?Past Therapy:             ?Status post second autologous stem cell transplant on 07/24/2014 ?Maintenance therapy with Pomalidomide/every 2 week Velcade - d/c'ed ?Radiation therapy for endometrial recurrence - completed 04/20/2015 ?Pomalyst/Kyprolis 70mg /m2 IV q 2 weeks - s/p cycle #12 - held on 12/26/2017 for hematuria ?Daratumumab/Pomalyst (1 mg) - status post cycle 19 -- d/c on 08/21/2019 ?Melflufen 40 mg IV q 4 weeks -- started on 08/27/2019, s/p cycle #2 --  D/c due to FDA removal ?Selinexor 60 mg po q week -- start on 01/19/2020 -- changed on 03/18/2020 -- d/c on 04/27/2020 ?  ?Current Therapy:        ?Blenrep 2.5 mg/m2 IV q 3 weeks -- started on 05/20/2020, s/p cycle #7 --D/C secondary to progression on 12/22/2020 ?Carfilzomib/Sarclisa -- s/p cycle #3 -- start on 01/03/2021 ?Femara 2.5 mg po q day  ?IV iron-Feraheme given on 03/29/2021 ? ?  ?Interim History:  Emily Greer is here today for follow-up.  Unfortunately, her disease is progressing.  Her lambda light chain I think went up to 81 mg/L. ? ?She had a little bit of bronchitis.  We did do a chest x-ray on her.  Thankfully there is no pneumonia.  This has gotten better.  We did have her on some antibiotics.  Her IgG level is I think 256 mg/dL which could certainly be an issue with respect to recurrent infections.  I have not put her on any IVIG because she really has had no problems with infections. ? ?She is going to go see Dr. Marissa Calamity down to [facility] this Thursday for an evaluation for clinical trial.  She may qualify for one of the CAR-T therapies.  She also may qualify for one of the "off-the-shelf"  BCMA treatments. ? ?I know that she is in great shape. ? ?She feels good.   She is having no problems with fatigue.  She is having no pain.  There is no cough or shortness of breath.  She has had no change in bowel or bladder habits. ? ?She has had no issues with respect to the uterine cancer.  This is been quite a while back.  She is on Femara for this. ? ?Is been a little bit of leg swelling but this is chronic.  We did give her iron last on that she was here.  Her iron saturation was quite low. ? ?Overall, I would say performance status is probably ECOG 1.   ? ? ?Medications:  ?Allergies as of 04/12/2021   ? ?   Reactions  ? Codeine Nausea Only  ? ?  ? ?  ?Medication List  ?  ? ?  ? Accurate as of April 12, 2021 11:22 AM. If you have any questions, ask your nurse or doctor.  ?  ?  ? ?  ? ?acyclovir ointment 5 % ?Commonly known as: Zovirax ?Apply thin layer to affected area two to three times daily prn ?  ?albuterol 108 (90 Base) MCG/ACT inhaler ?Commonly known as: VENTOLIN HFA ?Inhale 2 puffs into the lungs every 6 (six) hours as needed for wheezing or shortness of breath. 2 puffs 3 times daily x 5 days then every 6 hours as needed. ?  ?amLODipine  10 MG tablet ?Commonly known as: NORVASC ?TAKE 1 TABLET(10 MG) BY MOUTH EVERY MORNING ?  ?aspirin EC 81 MG tablet ?Take 81 mg by mouth daily. ?  ?azelastine 0.1 % nasal spray ?Commonly known as: ASTELIN ?Place 1 spray into both nostrils 2 (two) times daily. Use in each nostril as directed ?  ?CALCIUM 1200+D3 PO ?Take 1 tablet by mouth daily. ?  ?cefdinir 300 MG capsule ?Commonly known as: OMNICEF ?Take 2 capsules (600 mg total) by mouth daily. ?  ?cetirizine 10 MG tablet ?Commonly known as: ZYRTEC ?Take 1 tablet (10 mg total) by mouth at bedtime. ?  ?chlorhexidine 0.12 % solution ?Commonly known as: PERIDEX ?SMARTSIG:By Mouth ?  ?Fluocinolone Acetonide Scalp 0.01 % Oil ?Apply topically. ?  ?hydrochlorothiazide 12.5 MG capsule ?Commonly known as: MICROZIDE ?Take 12.5 mg by mouth every morning. ?  ?letrozole 2.5 MG tablet ?Commonly known as:  FEMARA ?Take 1 tablet (2.5 mg total) by mouth daily. ?  ?lidocaine 5 % ?Commonly known as: LIDODERM ?Place 1 patch onto the skin daily. Remove & Discard patch within 12 hours or as directed by MD ?  ?lidocaine-prilocaine cream ?Commonly known as: EMLA ?Apply 1 application topically as needed. ?  ?metoprolol succinate 50 MG 24 hr tablet ?Commonly known as: TOPROL-XL ?TAKE 1 TABLET(50 MG) BY MOUTH EVERY MORNING ?  ?montelukast 10 MG tablet ?Commonly known as: SINGULAIR ?TAKE 1 TABLET(10 MG) BY MOUTH AT BEDTIME ?  ?ondansetron 8 MG tablet ?Commonly known as: Zofran ?Take 1 tablet (8 mg total) by mouth 2 (two) times daily as needed (Nausea or vomiting). ?  ?penicillin v potassium 500 MG tablet ?Commonly known as: VEETID ?Take 500 mg by mouth 4 (four) times daily. For dental ?  ?Potassium Chloride ER 20 MEQ Tbcr ?tablet ?  ?potassium chloride SA 20 MEQ tablet ?Commonly known as: KLOR-CON M ?TAKE 1 TABLET(20 MEQ) BY MOUTH TWICE DAILY ?  ?PROBIOTIC DAILY PO ?Take by mouth daily. ?  ?triamcinolone 55 MCG/ACT Aero nasal inhaler ?Commonly known as: NASACORT ?Place 2 sprays into the nose as needed. ?  ?triamterene-hydrochlorothiazide 37.5-25 MG tablet ?Commonly known as: MAXZIDE-25 ?1 tablet by mouth at bedtime ?  ?valACYclovir 1000 MG tablet ?Commonly known as: VALTREX ?TAKE 1 TABLET(1000 MG) BY MOUTH THREE TIMES DAILY ?  ?Vitamin D3 50 MCG (2000 UT) Tabs ?Take 2 tablets by mouth daily. ?  ?Xiidra 5 % Soln ?Generic drug: Lifitegrast ?  ?zinc gluconate 50 MG tablet ?Take 50 mg by mouth daily. ?  ? ?  ? ? ?Allergies:  ?Allergies  ?Allergen Reactions  ? Codeine Nausea Only  ? ? ?Past Medical History, Surgical history, Social history, and Family History were reviewed and updated. ? ?Review of Systems: ?Review of Systems  ?Constitutional: Negative.   ?HENT: Negative.    ?Eyes:  Positive for blurred vision.  ?Respiratory: Negative.    ?Cardiovascular: Negative.   ?Gastrointestinal: Negative.   ?Genitourinary: Negative.    ?Musculoskeletal: Negative.   ?Skin: Negative.   ?Neurological: Negative.   ?Endo/Heme/Allergies: Negative.   ?Psychiatric/Behavioral: Negative.    ? ? ?Physical Exam: ? weight is 175 lb (79.4 kg). Her oral temperature is 98.2 ?F (36.8 ?C). Her blood pressure is 138/79 and her pulse is 100. Her respiration is 18 and oxygen saturation is 100%.  ? ?Wt Readings from Last 3 Encounters:  ?04/12/21 175 lb (79.4 kg)  ?04/06/21 177 lb 8 oz (80.5 kg)  ?04/05/21 176 lb 9.6 oz (80.1 kg)  ? ? ?Physical Exam ?Vitals reviewed.  ?  HENT:  ?   Head: Normocephalic and atraumatic.  ?Eyes:  ?   Pupils: Pupils are equal, round, and reactive to light.  ?Cardiovascular:  ?   Rate and Rhythm: Normal rate and regular rhythm.  ?   Heart sounds: Normal heart sounds.  ?Pulmonary:  ?   Effort: Pulmonary effort is normal.  ?   Breath sounds: Normal breath sounds.  ?Abdominal:  ?   General: Bowel sounds are normal.  ?   Palpations: Abdomen is soft.  ?Musculoskeletal:     ?   General: No tenderness or deformity. Normal range of motion.  ?   Cervical back: Normal range of motion.  ?Lymphadenopathy:  ?   Cervical: No cervical adenopathy.  ?Skin: ?   General: Skin is warm and dry.  ?   Findings: No erythema or rash.  ?Neurological:  ?   Mental Status: She is alert and oriented to person, place, and time.  ?Psychiatric:     ?   Behavior: Behavior normal.     ?   Thought Content: Thought content normal.     ?   Judgment: Judgment normal.  ? ? ? ?Lab Results  ?Component Value Date  ? WBC 7.4 04/12/2021  ? HGB 10.3 (L) 04/12/2021  ? HCT 32.3 (L) 04/12/2021  ? MCV 85.4 04/12/2021  ? PLT 199 04/12/2021  ? ?Lab Results  ?Component Value Date  ? FERRITIN 1,889 (H) 03/29/2021  ? IRON 31 03/29/2021  ? TIBC 363 03/29/2021  ? UIBC 332 03/29/2021  ? IRONPCTSAT 9 (L) 03/29/2021  ? ?Lab Results  ?Component Value Date  ? RETICCTPCT 2.7 04/12/2021  ? RBC 3.78 (L) 04/12/2021  ? RBC 3.73 (L) 04/12/2021  ? ?Lab Results  ?Component Value Date  ? KPAFRELGTCHN 1.7 (L)  03/29/2021  ? LAMBDASER 81.7 (H) 03/29/2021  ? KAPLAMBRATIO 0.02 (L) 03/29/2021  ? ?Lab Results  ?Component Value Date  ? IGGSERUM 241 (L) 03/29/2021  ? IGGSERUM 263 (L) 03/29/2021  ? IGA 6 (L) 03/29/2021  ? IGA 7 (L) 0

## 2021-04-13 LAB — IGG, IGA, IGM
IgA: 14 mg/dL — ABNORMAL LOW (ref 87–352)
IgG (Immunoglobin G), Serum: 308 mg/dL — ABNORMAL LOW (ref 586–1602)
IgM (Immunoglobulin M), Srm: 5 mg/dL — ABNORMAL LOW (ref 26–217)

## 2021-04-13 LAB — KAPPA/LAMBDA LIGHT CHAINS
Kappa free light chain: 4.8 mg/L (ref 3.3–19.4)
Kappa, lambda light chain ratio: 0.04 — ABNORMAL LOW (ref 0.26–1.65)
Lambda free light chains: 114.8 mg/L — ABNORMAL HIGH (ref 5.7–26.3)

## 2021-04-14 DIAGNOSIS — C9002 Multiple myeloma in relapse: Secondary | ICD-10-CM | POA: Diagnosis not present

## 2021-04-14 DIAGNOSIS — C9000 Multiple myeloma not having achieved remission: Secondary | ICD-10-CM | POA: Diagnosis not present

## 2021-04-15 LAB — IMMUNOFIXATION REFLEX, SERUM
IgA: 14 mg/dL — ABNORMAL LOW (ref 87–352)
IgG (Immunoglobin G), Serum: 339 mg/dL — ABNORMAL LOW (ref 586–1602)
IgM (Immunoglobulin M), Srm: 5 mg/dL — ABNORMAL LOW (ref 26–217)

## 2021-04-15 LAB — PROTEIN ELECTROPHORESIS, SERUM, WITH REFLEX
A/G Ratio: 1.3 (ref 0.7–1.7)
Albumin ELP: 3.6 g/dL (ref 2.9–4.4)
Alpha-1-Globulin: 0.3 g/dL (ref 0.0–0.4)
Alpha-2-Globulin: 1 g/dL (ref 0.4–1.0)
Beta Globulin: 1 g/dL (ref 0.7–1.3)
Gamma Globulin: 0.4 g/dL (ref 0.4–1.8)
Globulin, Total: 2.7 g/dL (ref 2.2–3.9)
M-Spike, %: 0.1 g/dL — ABNORMAL HIGH
SPEP Interpretation: 0
Total Protein ELP: 6.3 g/dL (ref 6.0–8.5)

## 2021-04-20 ENCOUNTER — Telehealth: Payer: Self-pay

## 2021-04-20 NOTE — Telephone Encounter (Signed)
Dr.Ennever spoke with Dr.Vorhees. Per Dr.Vorhees. patient to be pharesed around April 16th and then get infusion 6 weeks after that. Pt to get her tx here per Dr.Ennever before April 16th.  ? ?Called and informed pt. She declined any questions or concerns.  ?Scheduling message sent to get pt on for tx at our office.  ?

## 2021-04-21 ENCOUNTER — Other Ambulatory Visit: Payer: Self-pay | Admitting: Hematology & Oncology

## 2021-04-25 ENCOUNTER — Encounter: Payer: Self-pay | Admitting: Hematology & Oncology

## 2021-04-25 ENCOUNTER — Ambulatory Visit: Payer: Medicare Other

## 2021-04-25 ENCOUNTER — Inpatient Hospital Stay (HOSPITAL_BASED_OUTPATIENT_CLINIC_OR_DEPARTMENT_OTHER): Payer: Medicare Other | Admitting: Hematology & Oncology

## 2021-04-25 ENCOUNTER — Inpatient Hospital Stay: Payer: Medicare Other

## 2021-04-25 ENCOUNTER — Telehealth: Payer: Self-pay

## 2021-04-25 ENCOUNTER — Other Ambulatory Visit: Payer: Self-pay

## 2021-04-25 VITALS — BP 160/75 | HR 90 | Temp 98.2°F | Resp 18 | Ht 63.5 in | Wt 177.1 lb

## 2021-04-25 DIAGNOSIS — D509 Iron deficiency anemia, unspecified: Secondary | ICD-10-CM | POA: Diagnosis not present

## 2021-04-25 DIAGNOSIS — Z5111 Encounter for antineoplastic chemotherapy: Secondary | ICD-10-CM | POA: Diagnosis not present

## 2021-04-25 DIAGNOSIS — M7989 Other specified soft tissue disorders: Secondary | ICD-10-CM | POA: Diagnosis not present

## 2021-04-25 DIAGNOSIS — E876 Hypokalemia: Secondary | ICD-10-CM

## 2021-04-25 DIAGNOSIS — C9000 Multiple myeloma not having achieved remission: Secondary | ICD-10-CM

## 2021-04-25 DIAGNOSIS — C9001 Multiple myeloma in remission: Secondary | ICD-10-CM

## 2021-04-25 DIAGNOSIS — Z5112 Encounter for antineoplastic immunotherapy: Secondary | ICD-10-CM | POA: Diagnosis not present

## 2021-04-25 DIAGNOSIS — H538 Other visual disturbances: Secondary | ICD-10-CM | POA: Diagnosis not present

## 2021-04-25 LAB — CBC WITH DIFFERENTIAL (CANCER CENTER ONLY)
Abs Immature Granulocytes: 0.01 10*3/uL (ref 0.00–0.07)
Basophils Absolute: 0 10*3/uL (ref 0.0–0.1)
Basophils Relative: 1 %
Eosinophils Absolute: 0.1 10*3/uL (ref 0.0–0.5)
Eosinophils Relative: 3 %
HCT: 27.3 % — ABNORMAL LOW (ref 36.0–46.0)
Hemoglobin: 8.8 g/dL — ABNORMAL LOW (ref 12.0–15.0)
Immature Granulocytes: 0 %
Lymphocytes Relative: 15 %
Lymphs Abs: 0.8 10*3/uL (ref 0.7–4.0)
MCH: 27.6 pg (ref 26.0–34.0)
MCHC: 32.2 g/dL (ref 30.0–36.0)
MCV: 85.6 fL (ref 80.0–100.0)
Monocytes Absolute: 0.5 10*3/uL (ref 0.1–1.0)
Monocytes Relative: 11 %
Neutro Abs: 3.6 10*3/uL (ref 1.7–7.7)
Neutrophils Relative %: 70 %
Platelet Count: 160 10*3/uL (ref 150–400)
RBC: 3.19 MIL/uL — ABNORMAL LOW (ref 3.87–5.11)
RDW: 18.2 % — ABNORMAL HIGH (ref 11.5–15.5)
WBC Count: 5.1 10*3/uL (ref 4.0–10.5)
nRBC: 0 % (ref 0.0–0.2)

## 2021-04-25 LAB — CMP (CANCER CENTER ONLY)
ALT: 11 U/L (ref 0–44)
AST: 12 U/L — ABNORMAL LOW (ref 15–41)
Albumin: 4.1 g/dL (ref 3.5–5.0)
Alkaline Phosphatase: 48 U/L (ref 38–126)
Anion gap: 7 (ref 5–15)
BUN: 16 mg/dL (ref 8–23)
CO2: 30 mmol/L (ref 22–32)
Calcium: 9.8 mg/dL (ref 8.9–10.3)
Chloride: 102 mmol/L (ref 98–111)
Creatinine: 0.8 mg/dL (ref 0.44–1.00)
GFR, Estimated: >60 mL/min (ref >60)
Glucose, Bld: 101 mg/dL — ABNORMAL HIGH (ref 70–99)
Potassium: 3.1 mmol/L — ABNORMAL LOW (ref 3.5–5.1)
Sodium: 139 mmol/L (ref 135–145)
Total Bilirubin: 0.3 mg/dL (ref 0.3–1.2)
Total Protein: 6.4 g/dL — ABNORMAL LOW (ref 6.5–8.1)

## 2021-04-25 LAB — RETICULOCYTES
Immature Retic Fract: 26.4 % — ABNORMAL HIGH (ref 2.3–15.9)
RBC.: 3.23 MIL/uL — ABNORMAL LOW (ref 3.87–5.11)
Retic Count, Absolute: 87.9 10*3/uL (ref 19.0–186.0)
Retic Ct Pct: 2.7 % (ref 0.4–3.1)

## 2021-04-25 LAB — IRON AND IRON BINDING CAPACITY (CC-WL,HP ONLY)
Iron: 50 ug/dL (ref 28–170)
Saturation Ratios: 15 % (ref 10.4–31.8)
TIBC: 333 ug/dL (ref 250–450)
UIBC: 283 ug/dL (ref 148–442)

## 2021-04-25 LAB — LACTATE DEHYDROGENASE: LDH: 169 U/L (ref 98–192)

## 2021-04-25 LAB — FERRITIN: Ferritin: 2691 ng/mL — ABNORMAL HIGH (ref 11–307)

## 2021-04-25 MED ORDER — POTASSIUM CHLORIDE CRYS ER 20 MEQ PO TBCR
40.0000 meq | EXTENDED_RELEASE_TABLET | Freq: Two times a day (BID) | ORAL | Status: DC
  Administered 2021-04-25 (×2): 40 meq via ORAL

## 2021-04-25 MED ORDER — HEPARIN SOD (PORK) LOCK FLUSH 100 UNIT/ML IV SOLN
500.0000 [IU] | Freq: Once | INTRAVENOUS | Status: AC | PRN
  Administered 2021-04-25: 500 [IU]

## 2021-04-25 MED ORDER — SODIUM CHLORIDE 0.9 % IV SOLN: Freq: Once | INTRAVENOUS | Status: AC

## 2021-04-25 MED ORDER — SODIUM CHLORIDE 0.9 % IV SOLN
20.0000 mg | Freq: Once | INTRAVENOUS | Status: AC
  Administered 2021-04-25: 20 mg via INTRAVENOUS
  Filled 2021-04-25: qty 20

## 2021-04-25 MED ORDER — ACETAMINOPHEN 325 MG PO TABS
650.0000 mg | ORAL_TABLET | Freq: Once | ORAL | Status: AC
  Administered 2021-04-25: 650 mg via ORAL

## 2021-04-25 MED ORDER — PALONOSETRON HCL INJECTION 0.25 MG/5ML
0.2500 mg | Freq: Once | INTRAVENOUS | Status: AC
  Administered 2021-04-25: 0.25 mg via INTRAVENOUS

## 2021-04-25 MED ORDER — SODIUM CHLORIDE 0.9 % IV SOLN: Freq: Once | INTRAVENOUS | Status: DC

## 2021-04-25 MED ORDER — FAMOTIDINE IN NACL 20-0.9 MG/50ML-% IV SOLN
20.0000 mg | Freq: Once | INTRAVENOUS | Status: AC
  Administered 2021-04-25: 20 mg via INTRAVENOUS

## 2021-04-25 MED ORDER — SODIUM CHLORIDE 0.9 % IV SOLN
10.0000 mg/kg | Freq: Once | INTRAVENOUS | Status: AC
  Administered 2021-04-25: 800 mg via INTRAVENOUS
  Filled 2021-04-25: qty 25

## 2021-04-25 MED ORDER — DEXTROSE 5 % IV SOLN
56.0000 mg/m2 | Freq: Once | INTRAVENOUS | Status: AC
  Administered 2021-04-25: 110 mg via INTRAVENOUS
  Filled 2021-04-25: qty 30

## 2021-04-25 MED ORDER — DIPHENHYDRAMINE HCL 50 MG/ML IJ SOLN
50.0000 mg | Freq: Once | INTRAMUSCULAR | Status: AC
  Administered 2021-04-25: 50 mg via INTRAVENOUS

## 2021-04-25 MED ORDER — SODIUM CHLORIDE 0.9% FLUSH
10.0000 mL | INTRAVENOUS | Status: DC | PRN
  Administered 2021-04-25: 10 mL

## 2021-04-25 NOTE — Progress Notes (Signed)
Labs reviewed by MD, ok to treat depsite counts ? ?

## 2021-04-25 NOTE — Telephone Encounter (Signed)
Advised via MyChart.

## 2021-04-25 NOTE — Telephone Encounter (Signed)
-----   Message from [name], MD sent at 04/25/2021  3:21 PM EDT ----- ?Please make sure that she gets a dose of IV iron the next time that we treat her which will be next week.  Thanks.  Pete ?

## 2021-04-25 NOTE — Patient Instructions (Addendum)
[facility] [place]  Discharge Instructions: ?Thank you for choosing [facility] to provide your oncology and hematology care.  ? ?If you have a lab appointment with the [facility], please go directly to the [facility] and check in at the registration area. ? ?Wear comfortable clothing and clothing appropriate for easy access to any Portacath or PICC line.  ? ?We strive to give you quality time with your provider. You may need to reschedule your appointment if you arrive late (15 or more minutes).  Arriving late affects you and other patients whose appointments are after yours.  Also, if you miss three or more appointments without notifying the office, you may be dismissed from the clinic at the provider?s discretion.    ?  ?For prescription refill requests, have your pharmacy contact our office and allow 72 hours for refills to be completed.   ? ?Today you received the following chemotherapy and/or immunotherapy agents kyprolis, sarclisa  ?  ?To help prevent nausea and vomiting after your treatment, we encourage you to take your nausea medication as directed. ? ?BELOW ARE SYMPTOMS THAT SHOULD BE REPORTED IMMEDIATELY: ?*FEVER GREATER THAN 100.4 F (38 ?C) OR HIGHER ?*CHILLS OR SWEATING ?*NAUSEA AND VOMITING THAT IS NOT CONTROLLED WITH YOUR NAUSEA MEDICATION ?*UNUSUAL SHORTNESS OF BREATH ?*UNUSUAL BRUISING OR BLEEDING ?*URINARY PROBLEMS (pain or burning when urinating, or frequent urination) ?*BOWEL PROBLEMS (unusual diarrhea, constipation, pain near the anus) ?TENDERNESS IN MOUTH AND THROAT WITH OR WITHOUT PRESENCE OF ULCERS (sore throat, sores in mouth, or a toothache) ?UNUSUAL RASH, SWELLING OR PAIN  ?UNUSUAL VAGINAL DISCHARGE OR ITCHING  ? ?Items with * indicate a potential emergency and should be followed up as soon as possible or go to the Emergency Department if any problems should occur. ? ?Please show the CHEMOTHERAPY ALERT CARD or IMMUNOTHERAPY ALERT CARD at check-in  to the Emergency Department and triage nurse. ?Should you have questions after your visit or need to cancel or reschedule your appointment, please contact [facility]  [phone] and follow the prompts.  Office hours are 8:00 a.m. to 4:30 p.m. Monday - Friday. Please note that voicemails left after 4:00 p.m. may not be returned until the following business day.  We are closed weekends and major holidays. You have access to a nurse at all times for urgent questions. Please call the main number to the clinic [phone] and follow the prompts. ? ?For any non-urgent questions, you may also contact your provider using MyChart. We now offer e-Visits for anyone 68 and older to request care online for non-urgent symptoms. For details visit [email or web address]. ?  ?Also download the MyChart app! Go to the app store, search "MyChart", open the app, select , and log in with your MyChart username and password. ? ?Due to Covid, a mask is required upon entering the hospital/clinic. If you do not have a mask, one will be given to you upon arrival. For doctor visits, patients may have 1 support person aged 61 or older with them. For treatment visits, patients cannot have anyone with them due to current Covid guidelines and our immunocompromised population.  ?

## 2021-04-25 NOTE — Progress Notes (Signed)
?Hematology and Oncology Follow Up Visit ? ?Emily Greer ?[id number] ?[birth year] 71 y.o. ?04/25/2021 ? ? ?Principle Diagnosis:  ?Recurrent lambda light chain myeloma - nl cytogenetics ?History of recurrent endometrial carcinoma ?Iron deficiency anemia -blood loss ?  ?Past Therapy:             ?Status post second autologous stem cell transplant on 07/24/2014 ?Maintenance therapy with Pomalidomide/every 2 week Velcade - d/c'ed ?Radiation therapy for endometrial recurrence - completed 04/20/2015 ?Pomalyst/Kyprolis 70mg /m2 IV q 2 weeks - s/p cycle #12 - held on 12/26/2017 for hematuria ?Daratumumab/Pomalyst (1 mg) - status post cycle 19 -- d/c on 08/21/2019 ?Melflufen 40 mg IV q 4 weeks -- started on 08/27/2019, s/p cycle #2 --  D/c due to FDA removal ?Selinexor 60 mg po q week -- start on 01/19/2020 -- changed on 03/18/2020 -- d/c on 04/27/2020 ?  ?Current Therapy:        ?Blenrep 2.5 mg/m2 IV q 3 weeks -- started on 05/20/2020, s/p cycle #7 --D/C secondary to progression on 12/22/2020 ?Carfilzomib/Sarclisa -- s/p cycle #3 -- start on 01/03/2021 ?Femara 2.5 mg po q day  ?IV iron-Feraheme given on 03/29/2021 ? ?  ?Interim History:  [name] is here today for follow-up.  She did go down to see Dr. Dorothea Ogle at [facility].  He went ahead and got her enrolled into a program that probably will use one of the CAR-T therapies.  I think she gets her stem cells collected in about 3 weeks. ? ?He is having go ahead and do another partial cycle of the Sarclisa/carfilzomib.  We will give her dose today and a dose next week and then we will hold off until we get the go ahead for any additional therapy. ? ?Her last lambda light chain was 11.5 mg/dL.  This continues to go up. ? ?She has had no obvious bleeding.  She is doing well on the letrozole for the endometrial cancer. ? ?She has had no fever.  No cough.  She has had no leg swelling.  She has had no rashes. ? ?She did have a busy weekend.  She and her husband were down in  [place], [place].  They take her mother down there once a month. ? ?Currently, I would have said that her performance status is ECOG 1.  Is been a little bit of leg swelling but this is chronic.  We did give her iron last  ? ? ?Medications:  ?Allergies as of 04/25/2021   ? ?   Reactions  ? Codeine Nausea Only  ? ?  ? ?  ?Medication List  ?  ? ?  ? Accurate as of April 25, 2021 10:52 AM. If you have any questions, ask your nurse or doctor.  ?  ?  ? ?  ? ?STOP taking these medications   ? ?cefdinir 300 MG capsule ?Commonly known as: OMNICEF ?Stopped by: Josph Macho, MD ?  ? ?  ? ?TAKE these medications   ? ?acyclovir ointment 5 % ?Commonly known as: Zovirax ?Apply thin layer to affected area two to three times daily prn ?  ?albuterol 108 (90 Base) MCG/ACT inhaler ?Commonly known as: VENTOLIN HFA ?Inhale 2 puffs into the lungs every 6 (six) hours as needed for wheezing or shortness of breath. 2 puffs 3 times daily x 5 days then every 6 hours as needed. ?  ?amLODipine 10 MG tablet ?Commonly known as: NORVASC ?TAKE 1 TABLET(10 MG) BY MOUTH EVERY MORNING ?  ?aspirin EC  81 MG tablet ?Take 81 mg by mouth daily. ?  ?azelastine 0.1 % nasal spray ?Commonly known as: ASTELIN ?Place 1 spray into both nostrils 2 (two) times daily. Use in each nostril as directed ?  ?CALCIUM 1200+D3 PO ?Take 1 tablet by mouth daily. ?  ?cetirizine 10 MG tablet ?Commonly known as: ZYRTEC ?Take 1 tablet (10 mg total) by mouth at bedtime. ?  ?chlorhexidine 0.12 % solution ?Commonly known as: PERIDEX ?SMARTSIG:By Mouth ?  ?Fluocinolone Acetonide Scalp 0.01 % Oil ?Apply topically. ?  ?hydrochlorothiazide 12.5 MG capsule ?Commonly known as: MICROZIDE ?Take 12.5 mg by mouth every morning. ?  ?letrozole 2.5 MG tablet ?Commonly known as: FEMARA ?Take 1 tablet (2.5 mg total) by mouth daily. ?  ?lidocaine 5 % ?Commonly known as: LIDODERM ?Place 1 patch onto the skin daily. Remove & Discard patch within 12 hours or as directed by MD ?   ?lidocaine-prilocaine cream ?Commonly known as: EMLA ?Apply 1 application topically as needed. ?  ?metoprolol succinate 50 MG 24 hr tablet ?Commonly known as: TOPROL-XL ?TAKE 1 TABLET(50 MG) BY MOUTH EVERY MORNING ?  ?montelukast 10 MG tablet ?Commonly known as: SINGULAIR ?TAKE 1 TABLET(10 MG) BY MOUTH AT BEDTIME ?  ?ondansetron 8 MG tablet ?Commonly known as: Zofran ?Take 1 tablet (8 mg total) by mouth 2 (two) times daily as needed (Nausea or vomiting). ?  ?penicillin v potassium 500 MG tablet ?Commonly known as: VEETID ?Take 500 mg by mouth 4 (four) times daily. For dental ?  ?Potassium Chloride ER 20 MEQ Tbcr ?tablet ?  ?potassium chloride SA 20 MEQ tablet ?Commonly known as: KLOR-CON M ?TAKE 1 TABLET(20 MEQ) BY MOUTH TWICE DAILY ?  ?PROBIOTIC DAILY PO ?Take by mouth daily. ?  ?triamcinolone 55 MCG/ACT Aero nasal inhaler ?Commonly known as: NASACORT ?Place 2 sprays into the nose as needed. ?  ?triamterene-hydrochlorothiazide 37.5-25 MG tablet ?Commonly known as: MAXZIDE-25 ?1 tablet by mouth at bedtime ?  ?valACYclovir 1000 MG tablet ?Commonly known as: VALTREX ?TAKE 1 TABLET(1000 MG) BY MOUTH THREE TIMES DAILY ?  ?Vitamin D3 50 MCG (2000 UT) Tabs ?Take 2 tablets by mouth daily. ?  ?Xiidra 5 % Soln ?Generic drug: Lifitegrast ?  ?zinc gluconate 50 MG tablet ?Take 50 mg by mouth daily. ?  ? ?  ? ? ?Allergies:  ?Allergies  ?Allergen Reactions  ? Codeine Nausea Only  ? ? ?Past Medical History, Surgical history, Social history, and Family History were reviewed and updated. ? ?Review of Systems: ?Review of Systems  ?Constitutional: Negative.   ?HENT: Negative.    ?Eyes:  Positive for blurred vision.  ?Respiratory: Negative.    ?Cardiovascular: Negative.   ?Gastrointestinal: Negative.   ?Genitourinary: Negative.   ?Musculoskeletal: Negative.   ?Skin: Negative.   ?Neurological: Negative.   ?Endo/Heme/Allergies: Negative.   ?Psychiatric/Behavioral: Negative.    ? ? ?Physical Exam: ? height is 5' 3.5" (1.613 m) and weight  is 177 lb 1.9 oz (80.3 kg). Her oral temperature is 98.2 ?F (36.8 ?C). Her blood pressure is 160/75 (abnormal) and her pulse is 90. Her respiration is 18 and oxygen saturation is 98%.  ? ?Wt Readings from Last 3 Encounters:  ?04/25/21 177 lb 1.9 oz (80.3 kg)  ?04/12/21 175 lb (79.4 kg)  ?04/06/21 177 lb 8 oz (80.5 kg)  ? ? ?Physical Exam ?Vitals reviewed.  ?HENT:  ?   Head: Normocephalic and atraumatic.  ?Eyes:  ?   Pupils: Pupils are equal, round, and reactive to light.  ?Cardiovascular:  ?  Rate and Rhythm: Normal rate and regular rhythm.  ?   Heart sounds: Normal heart sounds.  ?Pulmonary:  ?   Effort: Pulmonary effort is normal.  ?   Breath sounds: Normal breath sounds.  ?Abdominal:  ?   General: Bowel sounds are normal.  ?   Palpations: Abdomen is soft.  ?Musculoskeletal:     ?   General: No tenderness or deformity. Normal range of motion.  ?   Cervical back: Normal range of motion.  ?Lymphadenopathy:  ?   Cervical: No cervical adenopathy.  ?Skin: ?   General: Skin is warm and dry.  ?   Findings: No erythema or rash.  ?Neurological:  ?   Mental Status: She is alert and oriented to person, place, and time.  ?Psychiatric:     ?   Behavior: Behavior normal.     ?   Thought Content: Thought content normal.     ?   Judgment: Judgment normal.  ? ? ? ?Lab Results  ?Component Value Date  ? WBC 5.1 04/25/2021  ? HGB 8.8 (L) 04/25/2021  ? HCT 27.3 (L) 04/25/2021  ? MCV 85.6 04/25/2021  ? PLT 160 04/25/2021  ? ?Lab Results  ?Component Value Date  ? FERRITIN 3,254 (H) 04/12/2021  ? IRON 102 04/12/2021  ? TIBC 417 04/12/2021  ? UIBC 315 04/12/2021  ? IRONPCTSAT 24 04/12/2021  ? ?Lab Results  ?Component Value Date  ? RETICCTPCT 2.7 04/25/2021  ? RBC 3.23 (L) 04/25/2021  ? RBC 3.19 (L) 04/25/2021  ? ?Lab Results  ?Component Value Date  ? KPAFRELGTCHN 4.8 04/12/2021  ? LAMBDASER 114.8 (H) 04/12/2021  ? KAPLAMBRATIO 0.04 (L) 04/12/2021  ? ?Lab Results  ?Component Value Date  ? IGGSERUM 308 (L) 04/12/2021  ? IGGSERUM 339 (L)  04/12/2021  ? IGA 14 (L) 04/12/2021  ? IGA 14 (L) 04/12/2021  ? IGMSERUM 5 (L) 04/12/2021  ? IGMSERUM 5 (L) 04/12/2021  ? ?Lab Results  ?Component Value Date  ? TOTALPROTELP 6.3 04/12/2021  ? ALBUMINELP 3.6 03/14/202

## 2021-04-26 ENCOUNTER — Other Ambulatory Visit: Payer: Medicare Other

## 2021-04-26 ENCOUNTER — Ambulatory Visit: Payer: Medicare Other | Admitting: Hematology & Oncology

## 2021-04-26 ENCOUNTER — Ambulatory Visit: Payer: Medicare Other

## 2021-04-26 LAB — IGG, IGA, IGM
IgA: 14 mg/dL — ABNORMAL LOW (ref 87–352)
IgG (Immunoglobin G), Serum: 305 mg/dL — ABNORMAL LOW (ref 586–1602)
IgM (Immunoglobulin M), Srm: <5 mg/dL — ABNORMAL LOW (ref 26–217)

## 2021-04-26 LAB — KAPPA/LAMBDA LIGHT CHAINS
Kappa free light chain: 4.1 mg/L (ref 3.3–19.4)
Kappa, lambda light chain ratio: 0.03 — ABNORMAL LOW (ref 0.26–1.65)
Lambda free light chains: 121.4 mg/L — ABNORMAL HIGH (ref 5.7–26.3)

## 2021-05-02 DIAGNOSIS — R5383 Other fatigue: Secondary | ICD-10-CM | POA: Diagnosis not present

## 2021-05-02 DIAGNOSIS — G629 Polyneuropathy, unspecified: Secondary | ICD-10-CM | POA: Diagnosis not present

## 2021-05-02 DIAGNOSIS — Z79899 Other long term (current) drug therapy: Secondary | ICD-10-CM | POA: Diagnosis not present

## 2021-05-02 DIAGNOSIS — Z0189 Encounter for other specified special examinations: Secondary | ICD-10-CM | POA: Diagnosis not present

## 2021-05-02 DIAGNOSIS — C541 Malignant neoplasm of endometrium: Secondary | ICD-10-CM | POA: Diagnosis not present

## 2021-05-02 DIAGNOSIS — I081 Rheumatic disorders of both mitral and tricuspid valves: Secondary | ICD-10-CM | POA: Diagnosis not present

## 2021-05-02 DIAGNOSIS — C9002 Multiple myeloma in relapse: Secondary | ICD-10-CM | POA: Diagnosis not present

## 2021-05-02 DIAGNOSIS — C9000 Multiple myeloma not having achieved remission: Secondary | ICD-10-CM | POA: Diagnosis not present

## 2021-05-02 DIAGNOSIS — Z7969 Long term (current) use of other immunomodulators and immunosuppressants: Secondary | ICD-10-CM | POA: Diagnosis not present

## 2021-05-03 ENCOUNTER — Inpatient Hospital Stay: Payer: Medicare Other

## 2021-05-03 ENCOUNTER — Inpatient Hospital Stay: Payer: Medicare Other | Attending: Hematology & Oncology

## 2021-05-03 VITALS — BP 137/74 | HR 100 | Temp 98.1°F | Resp 18

## 2021-05-03 DIAGNOSIS — C9000 Multiple myeloma not having achieved remission: Secondary | ICD-10-CM | POA: Diagnosis not present

## 2021-05-03 DIAGNOSIS — C541 Malignant neoplasm of endometrium: Secondary | ICD-10-CM

## 2021-05-03 DIAGNOSIS — Z9221 Personal history of antineoplastic chemotherapy: Secondary | ICD-10-CM | POA: Diagnosis not present

## 2021-05-03 DIAGNOSIS — Z79811 Long term (current) use of aromatase inhibitors: Secondary | ICD-10-CM | POA: Diagnosis not present

## 2021-05-03 DIAGNOSIS — Z87411 Personal history of vaginal dysplasia: Secondary | ICD-10-CM | POA: Insufficient documentation

## 2021-05-03 DIAGNOSIS — D500 Iron deficiency anemia secondary to blood loss (chronic): Secondary | ICD-10-CM | POA: Insufficient documentation

## 2021-05-03 DIAGNOSIS — Z9484 Stem cells transplant status: Secondary | ICD-10-CM | POA: Insufficient documentation

## 2021-05-03 DIAGNOSIS — R059 Cough, unspecified: Secondary | ICD-10-CM | POA: Diagnosis not present

## 2021-05-03 DIAGNOSIS — Z923 Personal history of irradiation: Secondary | ICD-10-CM | POA: Insufficient documentation

## 2021-05-03 DIAGNOSIS — Z5112 Encounter for antineoplastic immunotherapy: Secondary | ICD-10-CM | POA: Diagnosis not present

## 2021-05-03 DIAGNOSIS — C9001 Multiple myeloma in remission: Secondary | ICD-10-CM

## 2021-05-03 LAB — CMP (CANCER CENTER ONLY)
ALT: 11 U/L (ref 0–44)
AST: 12 U/L — ABNORMAL LOW (ref 15–41)
Albumin: 4 g/dL (ref 3.5–5.0)
Alkaline Phosphatase: 48 U/L (ref 38–126)
Anion gap: 9 (ref 5–15)
BUN: 10 mg/dL (ref 8–23)
CO2: 28 mmol/L (ref 22–32)
Calcium: 9.1 mg/dL (ref 8.9–10.3)
Chloride: 105 mmol/L (ref 98–111)
Creatinine: 0.85 mg/dL (ref 0.44–1.00)
GFR, Estimated: >60 mL/min (ref >60)
Glucose, Bld: 91 mg/dL (ref 70–99)
Potassium: 3.3 mmol/L — ABNORMAL LOW (ref 3.5–5.1)
Sodium: 142 mmol/L (ref 135–145)
Total Bilirubin: 0.3 mg/dL (ref 0.3–1.2)
Total Protein: 6 g/dL — ABNORMAL LOW (ref 6.5–8.1)

## 2021-05-03 LAB — CBC WITH DIFFERENTIAL (CANCER CENTER ONLY)
Abs Immature Granulocytes: 0.06 10*3/uL (ref 0.00–0.07)
Basophils Absolute: 0 10*3/uL (ref 0.0–0.1)
Basophils Relative: 1 %
Eosinophils Absolute: 0.1 10*3/uL (ref 0.0–0.5)
Eosinophils Relative: 1 %
HCT: 27.6 % — ABNORMAL LOW (ref 36.0–46.0)
Hemoglobin: 8.8 g/dL — ABNORMAL LOW (ref 12.0–15.0)
Immature Granulocytes: 1 %
Lymphocytes Relative: 17 %
Lymphs Abs: 0.8 10*3/uL (ref 0.7–4.0)
MCH: 27.5 pg (ref 26.0–34.0)
MCHC: 31.9 g/dL (ref 30.0–36.0)
MCV: 86.3 fL (ref 80.0–100.0)
Monocytes Absolute: 0.6 10*3/uL (ref 0.1–1.0)
Monocytes Relative: 13 %
Neutro Abs: 2.9 10*3/uL (ref 1.7–7.7)
Neutrophils Relative %: 67 %
Platelet Count: 131 10*3/uL — ABNORMAL LOW (ref 150–400)
RBC: 3.2 MIL/uL — ABNORMAL LOW (ref 3.87–5.11)
RDW: 18.3 % — ABNORMAL HIGH (ref 11.5–15.5)
WBC Count: 4.3 10*3/uL (ref 4.0–10.5)
nRBC: 0 % (ref 0.0–0.2)

## 2021-05-03 LAB — RETICULOCYTES
Immature Retic Fract: 24.5 % — ABNORMAL HIGH (ref 2.3–15.9)
RBC.: 3.24 MIL/uL — ABNORMAL LOW (ref 3.87–5.11)
Retic Count, Absolute: 117.6 10*3/uL (ref 19.0–186.0)
Retic Ct Pct: 3.6 % — ABNORMAL HIGH (ref 0.4–3.1)

## 2021-05-03 LAB — IRON AND IRON BINDING CAPACITY (CC-WL,HP ONLY)
Iron: 62 ug/dL (ref 28–170)
Saturation Ratios: 18 % (ref 10.4–31.8)
TIBC: 344 ug/dL (ref 250–450)
UIBC: 282 ug/dL (ref 148–442)

## 2021-05-03 LAB — SAVE SMEAR(SSMR), FOR PROVIDER SLIDE REVIEW

## 2021-05-03 LAB — FERRITIN: Ferritin: 2494 ng/mL — ABNORMAL HIGH (ref 11–307)

## 2021-05-03 MED ORDER — HEPARIN SOD (PORK) LOCK FLUSH 100 UNIT/ML IV SOLN
500.0000 [IU] | Freq: Once | INTRAVENOUS | Status: AC | PRN
  Administered 2021-05-03: 500 [IU]

## 2021-05-03 MED ORDER — SODIUM CHLORIDE 0.9 % IV SOLN
20.0000 mg | Freq: Once | INTRAVENOUS | Status: AC
  Administered 2021-05-03: 20 mg via INTRAVENOUS
  Filled 2021-05-03: qty 20

## 2021-05-03 MED ORDER — SODIUM CHLORIDE 0.9 % IV SOLN: Freq: Once | INTRAVENOUS | Status: AC

## 2021-05-03 MED ORDER — FAMOTIDINE IN NACL 20-0.9 MG/50ML-% IV SOLN
20.0000 mg | Freq: Once | INTRAVENOUS | Status: AC
  Administered 2021-05-03: 20 mg via INTRAVENOUS
  Filled 2021-05-03: qty 50

## 2021-05-03 MED ORDER — DEXTROSE 5 % IV SOLN
56.0000 mg/m2 | Freq: Once | INTRAVENOUS | Status: AC
  Administered 2021-05-03: 110 mg via INTRAVENOUS
  Filled 2021-05-03: qty 30

## 2021-05-03 MED ORDER — SODIUM CHLORIDE 0.9% FLUSH
10.0000 mL | INTRAVENOUS | Status: DC | PRN
  Administered 2021-05-03: 10 mL

## 2021-05-03 MED ORDER — SODIUM CHLORIDE 0.9 % IV SOLN
510.0000 mg | Freq: Once | INTRAVENOUS | Status: AC
  Administered 2021-05-03: 510 mg via INTRAVENOUS
  Filled 2021-05-03: qty 17

## 2021-05-03 MED ORDER — SODIUM CHLORIDE 0.9 % IV SOLN: Freq: Once | INTRAVENOUS | Status: DC

## 2021-05-03 MED ORDER — PALONOSETRON HCL INJECTION 0.25 MG/5ML
0.2500 mg | Freq: Once | INTRAVENOUS | Status: AC
  Administered 2021-05-03: 0.25 mg via INTRAVENOUS
  Filled 2021-05-03: qty 5

## 2021-05-03 NOTE — Patient Instructions (Signed)

## 2021-05-03 NOTE — Progress Notes (Signed)
Patient without followup appts.  Dr Marin Olp notified.  Wants patient to come back 05/22/21 for lab/port/Dr E and  infusion.  Patient taken to schedulers to arrange ? ?

## 2021-05-03 NOTE — Patient Instructions (Signed)
Ferumoxytol Injection ?What is this medication? ?FERUMOXYTOL (FER ue MOX i tol) treats low levels of iron in your body (iron deficiency anemia). Iron is a mineral that plays an important role in making red blood cells, which carry oxygen from your lungs to the rest of your body. ?This medicine may be used for other purposes; ask your health care provider or pharmacist if you have questions. ?COMMON BRAND NAME(S): Feraheme ?What should I tell my care team before I take this medication? ?They need to know if you have any of these conditions: ?Anemia not caused by low iron levels ?High levels of iron in the blood ?Magnetic resonance imaging (MRI) test scheduled ?An unusual or allergic reaction to iron, other medications, foods, dyes, or preservatives ?Pregnant or trying to get pregnant ?Breast-feeding ?How should I use this medication? ?This medication is for injection into a vein. It is given in a hospital or clinic setting. ?Talk to your care team the use of this medication in children. Special care may be needed. ?Overdosage: If you think you have taken too much of this medicine contact a poison control center or emergency room at once. ?NOTE: This medicine is only for you. Do not share this medicine with others. ?What if I miss a dose? ?It is important not to miss your dose. Call your care team if you are unable to keep an appointment. ?What may interact with this medication? ?Other iron products ?This list may not describe all possible interactions. Give your health care provider a list of all the medicines, herbs, non-prescription drugs, or dietary supplements you use. Also tell them if you smoke, drink alcohol, or use illegal drugs. Some items may interact with your medicine. ?What should I watch for while using this medication? ?Visit your care team regularly. Tell your care team if your symptoms do not start to get better or if they get worse. You may need blood work done while you are taking this  medication. ?You may need to follow a special diet. Talk to your care team. Foods that contain iron include: whole grains/cereals, dried fruits, beans, or peas, leafy green vegetables, and organ meats (liver, kidney). ?What side effects may I notice from receiving this medication? ?Side effects that you should report to your care team as soon as possible: ?Allergic reactions--skin rash, itching, hives, swelling of the face, lips, tongue, or throat ?Low blood pressure--dizziness, feeling faint or lightheaded, blurry vision ?Shortness of breath ?Side effects that usually do not require medical attention (report to your care team if they continue or are bothersome): ?Flushing ?Headache ?Joint pain ?Muscle pain ?Nausea ?Pain, redness, or irritation at injection site ?This list may not describe all possible side effects. Call your doctor for medical advice about side effects. You may report side effects to FDA at 1-800-FDA-1088. ?Where should I keep my medication? ?This medication is given in a hospital or clinic and will not be stored at home. ?NOTE: This sheet is a summary. It may not cover all possible information. If you have questions about this medicine, talk to your doctor, pharmacist, or health care provider. ?? 2022 Elsevier/Gold Standard (2020-06-11 00:00:00) ?[facility]  Discharge Instructions: ?Thank you for choosing [facility] to provide your oncology and hematology care.  ? ?If you have a lab appointment with the [facility], please go directly to the [facility] and check in at the registration area. ? ?Wear comfortable clothing and clothing appropriate for easy access to any  Portacath or PICC line.  ? ?We strive to give you quality time with your provider. You may need to reschedule your appointment if you arrive late (15 or more minutes).  Arriving late affects you and other patients whose appointments are after yours.  Also, if you miss three or more  appointments without notifying the office, you may be dismissed from the clinic at the provider?s discretion.    ?  ?For prescription refill requests, have your pharmacy contact our office and allow 72 hours for refills to be completed.   ? ?Today you received the following chemotherapy and/or immunotherapy agents Kryprolis ?    ?  ?To help prevent nausea and vomiting after your treatment, we encourage you to take your nausea medication as directed. ? ?BELOW ARE SYMPTOMS THAT SHOULD BE REPORTED IMMEDIATELY: ?*FEVER GREATER THAN 100.4 F (38 ?C) OR HIGHER ?*CHILLS OR SWEATING ?*NAUSEA AND VOMITING THAT IS NOT CONTROLLED WITH YOUR NAUSEA MEDICATION ?*UNUSUAL SHORTNESS OF BREATH ?*UNUSUAL BRUISING OR BLEEDING ?*URINARY PROBLEMS (pain or burning when urinating, or frequent urination) ?*BOWEL PROBLEMS (unusual diarrhea, constipation, pain near the anus) ?TENDERNESS IN MOUTH AND THROAT WITH OR WITHOUT PRESENCE OF ULCERS (sore throat, sores in mouth, or a toothache) ?UNUSUAL RASH, SWELLING OR PAIN  ?UNUSUAL VAGINAL DISCHARGE OR ITCHING  ? ?Items with * indicate a potential emergency and should be followed up as soon as possible or go to the Emergency Department if any problems should occur. ? ?Please show the CHEMOTHERAPY ALERT CARD or IMMUNOTHERAPY ALERT CARD at check-in to the Emergency Department and triage nurse. ?Should you have questions after your visit or need to cancel or reschedule your appointment, please contact [facility]  [phone] and follow the prompts.  Office hours are 8:00 a.m. to 4:30 p.m. Monday - Friday. Please note that voicemails left after 4:00 p.m. may not be returned until the following business day.  We are closed weekends and major holidays. You have access to a nurse at all times for urgent questions. Please call the main number to the clinic [phone] and follow the prompts. ? ?For any non-urgent questions, you may also contact your provider using MyChart. We  now offer e-Visits for anyone 66 and older to request care online for non-urgent symptoms. For details visit [email or web address]. ?  ?Also download the MyChart app! Go to the app store, search "MyChart", open the app, select [facility], and log in with your MyChart username and password. ? ?Due to Covid, a mask is required upon entering the hospital/clinic. If you do not have a mask, one will be given to you upon arrival. For doctor visits, patients may have 1 support person aged 51 or older with them. For treatment visits, patients cannot have anyone with them due to current Covid guidelines and our immunocompromised population. [facility]  Discharge Instructions: ?Thank you for choosing [facility] to provide your oncology and hematology care.  ? ?If you have a lab appointment with the [facility], please go directly to the [facility] and check in at the registration area. ? ?Wear comfortable clothing and clothing appropriate for easy access to any Portacath or PICC line.  ? ?We strive to give you quality time with your provider. You may need to reschedule your appointment if you arrive late (15 or more minutes).  Arriving late affects you and other patients whose appointments are after yours.  Also, if you miss three or more appointments  without notifying the office, you may be dismissed from the clinic at the provider?s discretion.    ?  ?For prescription refill requests, have your pharmacy contact our office and allow 72 hours for refills to be completed.   ? ?Today you received the following chemotherapy and/or immunotherapy agents Kyprolis    ?  ?To help prevent nausea and vomiting after your treatment, we encourage you to take your nausea medication as directed. ? ?BELOW ARE SYMPTOMS THAT SHOULD BE REPORTED IMMEDIATELY: ?*FEVER GREATER THAN 100.4 F (38 ?C) OR HIGHER ?*CHILLS OR SWEATING ?*NAUSEA AND VOMITING THAT IS NOT CONTROLLED WITH YOUR NAUSEA  MEDICATION ?*UNUSUAL SHORTNESS OF BREATH ?*UNUSUAL BRUISING OR BLEEDING ?*URINARY PROBLEMS (pain or burning when urinating, or frequent urination) ?*BOWEL PROBLEMS (unusual diarrhea, constipation, pain near the anus) ?TENDERN

## 2021-05-04 ENCOUNTER — Encounter: Payer: Self-pay | Admitting: Gynecologic Oncology

## 2021-05-05 ENCOUNTER — Other Ambulatory Visit (HOSPITAL_COMMUNITY)
Admission: RE | Admit: 2021-05-05 | Discharge: 2021-05-05 | Disposition: A | Discharge status: Home / Self Care | Payer: Medicare Other | Source: Ambulatory Visit | Attending: Gynecologic Oncology | Admitting: Gynecologic Oncology

## 2021-05-05 ENCOUNTER — Inpatient Hospital Stay (HOSPITAL_BASED_OUTPATIENT_CLINIC_OR_DEPARTMENT_OTHER): Payer: Medicare Other | Admitting: Gynecologic Oncology

## 2021-05-05 ENCOUNTER — Encounter: Payer: Self-pay | Admitting: Hematology & Oncology

## 2021-05-05 ENCOUNTER — Encounter: Payer: Self-pay | Admitting: Gynecologic Oncology

## 2021-05-05 ENCOUNTER — Other Ambulatory Visit: Payer: Self-pay

## 2021-05-05 VITALS — BP 183/81 | HR 65 | Temp 97.8°F | Resp 18 | Ht 63.0 in | Wt 177.6 lb

## 2021-05-05 DIAGNOSIS — B977 Papillomavirus as the cause of diseases classified elsewhere: Secondary | ICD-10-CM

## 2021-05-05 DIAGNOSIS — Z923 Personal history of irradiation: Secondary | ICD-10-CM

## 2021-05-05 DIAGNOSIS — D500 Iron deficiency anemia secondary to blood loss (chronic): Secondary | ICD-10-CM | POA: Diagnosis not present

## 2021-05-05 DIAGNOSIS — C9000 Multiple myeloma not having achieved remission: Secondary | ICD-10-CM

## 2021-05-05 DIAGNOSIS — Z5112 Encounter for antineoplastic immunotherapy: Secondary | ICD-10-CM | POA: Diagnosis not present

## 2021-05-05 DIAGNOSIS — Z79811 Long term (current) use of aromatase inhibitors: Secondary | ICD-10-CM

## 2021-05-05 DIAGNOSIS — Z0142 Encounter for cervical smear to confirm findings of recent normal smear following initial abnormal smear: Secondary | ICD-10-CM | POA: Insufficient documentation

## 2021-05-05 DIAGNOSIS — Z87411 Personal history of vaginal dysplasia: Secondary | ICD-10-CM

## 2021-05-05 DIAGNOSIS — C541 Malignant neoplasm of endometrium: Secondary | ICD-10-CM

## 2021-05-05 DIAGNOSIS — Z9221 Personal history of antineoplastic chemotherapy: Secondary | ICD-10-CM | POA: Diagnosis not present

## 2021-05-05 DIAGNOSIS — Z9484 Stem cells transplant status: Secondary | ICD-10-CM | POA: Diagnosis not present

## 2021-05-05 MED ORDER — LETROZOLE 2.5 MG PO TABS: 2.5000 mg | ORAL_TABLET | Freq: Every day | ORAL | 12 refills | Status: DC

## 2021-05-05 NOTE — Progress Notes (Signed)
Gynecologic Oncology Return Clinic Visit ? ?05/05/21 ? ?Reason for Visit: surveillance visit in the setting of a history of endometrial cancer ? ?Treatment History: ?Oncology History  ?Myeloma ([place])  ?Lambda light chain myeloma ([place])  ?11/11/2007 Initial Diagnosis  ? Lambda light chain myeloma ([place]) ?  ?08/28/2019 - 11/05/2019 Chemotherapy  ? The patient had dexamethasone (DECADRON) 4 MG tablet, 40 mg, Oral, Weekly, 1 of 1 cycle, Start date: --, End date: -- ?palonosetron (ALOXI) injection 0.25 mg, 0.25 mg, Intravenous,  Once, 3 of 4 cycles ?Administration: 0.25 mg (08/28/2019), 0.25 mg (10/08/2019), 0.25 mg (11/05/2019) ?melphalan flufenamide (PEPAXTO) 40 mg in sodium chloride 0.9 % 170 mL chemo infusion, 40 mg, Intravenous,  Once, 3 of 4 cycles ?Administration: 40 mg (08/28/2019), 40 mg (10/08/2019), 40 mg (11/05/2019) ? ? for chemotherapy treatment.  ? ?  ?05/20/2020 - 12/01/2020 Chemotherapy  ? Patient is on Treatment Plan : MULTIPLE MYELOMA RELAPSED/REFRACTORY Belantamab mafodotin-blmf q21d  ?   ?01/04/2021 -  Chemotherapy  ? Patient is on Treatment Plan :  MYELOMA Carfilzomib + Dexamethasone + Isatuximab q28d  ?   ?Multiple myeloma in remission ([place])  ?05/05/2014 Initial Diagnosis  ? Multiple myeloma in remission [facility]) ?  ?03/07/2018 - 07/24/2019 Chemotherapy  ? The patient had dexamethasone (DECADRON) tablet 20 mg, 20 mg (100 % of original dose 20 mg), Oral,  Once, 11 of 13 cycles ?Dose modification: 8 mg (original dose 20 mg, Cycle 9, Reason: Other (see comments)), 20 mg (original dose 20 mg, Cycle 9, Reason: Other (see comments)) ?Administration: 20 mg (10/31/2018), 20 mg (11/28/2018), 20 mg (01/02/2019), 20 mg (01/30/2019), 20 mg (03/06/2019), 20 mg (04/03/2019), 20 mg (05/01/2019), 20 mg (05/30/2019), 20 mg (06/26/2019), 20 mg (07/24/2019) ?dexamethasone (DECADRON) 4 MG tablet, 4 mg, , , 1 of 1 cycle, Start date: 03/14/2018, End date: 04/11/2018 ?daratumumab-hyaluronidase-fihj (DARZALEX FASPRO) 1800-30000 MG-UT/15ML chemo SQ injection  1,800 mg, 1,800 mg, Subcutaneous,  Once, 11 of 13 cycles ?Administration: 1,800 mg (10/31/2018), 1,800 mg (11/28/2018), 1,800 mg (01/02/2019), 1,800 mg (01/30/2019), 1,800 mg (03/06/2019), 1,800 mg (04/03/2019), 1,800 mg (05/01/2019), 1,800 mg (05/30/2019), 1,800 mg (06/26/2019), 1,800 mg (07/24/2019) ?daratumumab (DARZALEX) 1,400 mg in sodium chloride 0.9 % 930 mL (1.4 mg/mL) chemo infusion, 16.5 mg/kg = 1,360 mg, Intravenous, Once, 1 of 1 cycle ?Administration: 1,400 mg (03/07/2018) ?daratumumab (DARZALEX) 1,400 mg in sodium chloride 0.9 % 430 mL (2.8 mg/mL) chemo infusion, 16.5 mg/kg = 1,360 mg, Intravenous, Once, 2 of 2 cycles ?Administration: 1,400 mg (03/14/2018), 1,400 mg (03/21/2018) ?daratumumab (DARZALEX) 1,400 mg in sodium chloride 0.9 % 430 mL chemo infusion, 16.5 mg/kg = 1,360 mg, Intravenous, Once, 8 of 8 cycles ?Administration: 1,400 mg (03/28/2018), 1,400 mg (04/11/2018), 1,400 mg (04/18/2018), 1,400 mg (04/25/2018), 1,400 mg (05/02/2018), 1,400 mg (05/09/2018), 1,400 mg (05/23/2018), 1,400 mg (08/29/2018), 1,400 mg (06/20/2018), 1,400 mg (07/04/2018), 1,400 mg (07/18/2018), 1,300 mg (06/06/2018), 1,400 mg (08/01/2018), 1,400 mg (08/15/2018), 1,400 mg (10/03/2018) ? ? for chemotherapy treatment.  ? ?  ? ?She was initially seen in consultation on 10/05/2011 grade 1 endometrial cancer She then underwent a total abdominal hysterectomy bilateral salpingo-oophorectomy bilateral pelvic lymph node dissection on 62/83/6629 without complications. Her postoperative course was uncomplicated. Her final pathologic diagnosis is a Stage 1A Grade 2 endometrioid endometrial cancer with negative lymphovascular space invasion, 2/20 (10%) of myometrial invasion and negative lymph nodes. ? ?On 01/2012 visit she reported post coital vaginal bleeding that was self limiting. Vaginal biopsy - LARGELY DENUDED SQUAMOUS EPITHELIUM WITH ASSOCIATED SPONGIOSIS AND CHRONIC INFLAMMATION.- NO DYSPLASIA OR MALIGNANCY ? ?  On the visit 04/2012 she c/o vaginal bleeding.  Biopsies were c/w metastatic disease at the distal vagina ? ?PET 06/05/2012 IMPRESSION:  ?1. There are no specific features identified to suggest hypermetabolic metastasis from endometrial carcinoma.  ?2. Skeletal changes of multiple myeloma with focal area of increased uptake  ? ?Treated with vaginal brachytherapy June 4, June 11, June 19, June 25, June 30/2014 ?Site/dose: Vagina, 30.5 Gy in 5 fractions (6 Gy, 6 Gy, 6 Gy, 6 Gy, 6.5 Gy) ? ?Pap 08/17/2013 ASCUS HPV neg ? ?She was seen by Dr Leo Grosser on 01/21/15 at which time a friable lesion was noted at the vaginal cuff. This was biopsied and was consistent with endometrioid adenocarcinoma (recurrence). ? ?CT C/A/P 02/15/2015 without evidence of metastatic disease. Care complicated by successful transplant 2016 for Rx multiple myeloma. Received conformational radiotherapy completed 03/2015 and then started on femara ? ?CT C/A/P 07/26/2015 - PET declined by insurance- ?1. Stable examinations demonstrating no evidence of metastatic endometrial carcinoma. ?2. Stable widespread osseous findings attributed to multiple myeloma. No evidence of pathologic fracture or epidural tumor. ?3. Grossly stable small left thyroid nodule. ?4. Chronic bilateral femoral head avascular necrosis. ? ?Vaginal pap 10/2015 - ASCUS, HR HPV + ?Vaginoscopy with biopsy 12/30/2015 c/w VAIN-I (MILD DYSPLASIA). NO MALIGNANCY IDENTIFIED. ?S/P Co2 laser of the vagina 02/22/2016 ?Pap 06/2016 - ASCUS HPV + ?Vaginoscopy with bx 07/2016 c/w VAIN 1 ?Pap 10/2018 - ASCUS, HR HPV not detected ?Pap 10/2019 - ASCUS, HR HPV+ ? ?Interval History: ?Patient reports overall doing well.  She is tentatively set up to do CAR-T therapy for her myeloma.  This will be in [place]. ? ?She denies any vaginal bleeding or discharge.  She denies any pelvic or abdominal pain.  She notes a little bit of constipation for the last few days but had a normal bowel movement today.  She denies any urinary symptoms. ? ?She has been out of Femara  for about a month now.  Denies any significant side effects on Femara previously.  Discussions with prior GYN oncologist had been for Femara as long as she was tolerating in the setting of her history of recurrent endometrial cancer. ? ?Past Medical/Surgical History: ?Past Medical History:  ?Diagnosis Date  ? Avascular necrosis of femoral head ([place])   ? bilateral per CT 07-26-2015  ? Endometrial carcinoma [facility]) gyn oncologist-  dr Skeet Latch ([facility])/  radiation oncologist-- dr Sondra Come  ? 2013 dx  FIGO Stage 1A, Grade 2 endometrioid endometrial cancer s/p TAH w/ BSO and bilateral pelvic node dissection 10-31-2011 ;  recurrence at distal vagina 04/ 2014 s/p  brachytherapy (ended 07-29-2012);  2nd recurrence 12/ 2016  vaginal apex s/p  conformational radiotherapy 03-10-2015 to 04-20-2015  ? Family history of adverse reaction to anesthesia   ? mother ponv  ? GERD (gastroesophageal reflux disease)   ? Goals of care, counseling/discussion 08/21/2019  ? H/O stem cell transplant ([place])   ? 02/ 2000 and second one 06/ 2016  ? History of bacteremia   ? 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  ? History of chemotherapy   ? last chemo 12-26-17  ? History of radiation therapy 6/4, 6/11, 6/19, 6/25, 07/29/2012  ? vagina 30.5 gray in 5 fx, HDR brachytherapy:   last radiation to vagina 03-10-2015 to 04-20-2015  50.4gray  ? History of radiation therapy 03/10/15-04/20/15  ? vagina 50.4 in 28 fractions  ? Hypertension   ? Lambda light chain myeloma [facility]) oncologist-  dr Marin Olp ([facility])  and  [facility] -- dr Salena Saner gasparetto  ? dx 07/ 1999 s/p  VAD chemotherapy 11/ 1999,  purged autotransplant 03-21-1998 followed by auto stem cell transplant 03-29-1998;  recurrance w/ second autologous stem cell transplant 07-24-2014;  in Re-mission currently , chemo maintenance therapy  ? Osteoporosis 12/18/05  ? Increased  risk   ? PONV (postoperative nausea and vomiting)   ? Wears glasses   ? ? ?Past Surgical History:   ?Procedure Laterality Date  ? ABDOMINAL HYSTERECTOMY  10/31/2011  ? Procedure: HYSTERECTOMY ABDOMINAL;  Surgeon: [name], MD PHD;  Location: [facility];  Service: Gynecology;  Laterality: N/A;  ? CO2 LAS

## 2021-05-05 NOTE — Patient Instructions (Addendum)
It was a pleasure meeting you today.  I will let you know when I get your Pap test back, likely in a week. ? ?I sent a refill of your Femara to the pharmacy. ? ?I will let you know when I get your bone density results back.  I recommend this because the aromatase inhibitor that you are on can affect your bone density.  Please continue taking recommended dosing of calcium and vitamin D. ? ?Your 5 years out from completing treatment for your endometrial cancer.  I recommend a yearly visit with your OB/GYN.  If you develop any of the symptoms that we discussed today, please call to be seen sooner. ?

## 2021-05-10 ENCOUNTER — Other Ambulatory Visit: Payer: Self-pay

## 2021-05-10 ENCOUNTER — Telehealth: Payer: Medicare Other

## 2021-05-10 ENCOUNTER — Telehealth: Payer: Self-pay

## 2021-05-10 ENCOUNTER — Encounter: Payer: Self-pay | Admitting: Obstetrics & Gynecology

## 2021-05-10 DIAGNOSIS — Z13820 Encounter for screening for osteoporosis: Secondary | ICD-10-CM

## 2021-05-10 LAB — CYTOLOGY - PAP
Comment: NEGATIVE
Diagnosis: UNDETERMINED — AB
High risk HPV: NEGATIVE

## 2021-05-10 NOTE — Progress Notes (Signed)
error 

## 2021-05-10 NOTE — Telephone Encounter (Signed)
?  Care Management  ? ?Follow Up Note ? ? ?05/10/2021 ?Name: Emily Greer MRN: [id number] DOB: 07-Aug-1950 ? ? ?Referred by: [name], MD ?Reason for referral : Chronic Care Management (RN CM Follow up call ) ? ? ?An unsuccessful telephone outreach was attempted today. The patient was referred to the case management team for assistance with care management and care coordination.  ? ?Follow Up Plan: Telephone follow up appointment with care management team member scheduled for: 05/25/21 ? ?Delsa Sale, RN, BSN, CCM ?Care Management Coordinator ?[facility] Care Management/[facility]  ?Direct Phone: [phone] ? ? ?

## 2021-05-16 DIAGNOSIS — R6889 Other general symptoms and signs: Secondary | ICD-10-CM | POA: Diagnosis not present

## 2021-05-16 DIAGNOSIS — T82514A Breakdown (mechanical) of infusion catheter, initial encounter: Secondary | ICD-10-CM | POA: Diagnosis not present

## 2021-05-16 DIAGNOSIS — D759 Disease of blood and blood-forming organs, unspecified: Secondary | ICD-10-CM | POA: Diagnosis present

## 2021-05-16 DIAGNOSIS — Z8542 Personal history of malignant neoplasm of other parts of uterus: Secondary | ICD-10-CM | POA: Diagnosis not present

## 2021-05-16 DIAGNOSIS — Z5111 Encounter for antineoplastic chemotherapy: Secondary | ICD-10-CM | POA: Diagnosis not present

## 2021-05-16 DIAGNOSIS — H25813 Combined forms of age-related cataract, bilateral: Secondary | ICD-10-CM | POA: Diagnosis not present

## 2021-05-16 DIAGNOSIS — R058 Other specified cough: Secondary | ICD-10-CM | POA: Diagnosis not present

## 2021-05-16 DIAGNOSIS — D500 Iron deficiency anemia secondary to blood loss (chronic): Secondary | ICD-10-CM | POA: Diagnosis not present

## 2021-05-16 DIAGNOSIS — Z452 Encounter for adjustment and management of vascular access device: Secondary | ICD-10-CM | POA: Diagnosis not present

## 2021-05-16 DIAGNOSIS — D509 Iron deficiency anemia, unspecified: Secondary | ICD-10-CM | POA: Diagnosis not present

## 2021-05-16 DIAGNOSIS — C541 Malignant neoplasm of endometrium: Secondary | ICD-10-CM | POA: Diagnosis not present

## 2021-05-16 DIAGNOSIS — Z5112 Encounter for antineoplastic immunotherapy: Secondary | ICD-10-CM | POA: Diagnosis not present

## 2021-05-16 DIAGNOSIS — Z20822 Contact with and (suspected) exposure to covid-19: Secondary | ICD-10-CM | POA: Diagnosis present

## 2021-05-16 DIAGNOSIS — Z9289 Personal history of other medical treatment: Secondary | ICD-10-CM | POA: Diagnosis not present

## 2021-05-16 DIAGNOSIS — H16223 Keratoconjunctivitis sicca, not specified as Sjogren's, bilateral: Secondary | ICD-10-CM | POA: Diagnosis not present

## 2021-05-16 DIAGNOSIS — Z79811 Long term (current) use of aromatase inhibitors: Secondary | ICD-10-CM | POA: Diagnosis not present

## 2021-05-16 DIAGNOSIS — T8249XA Other complication of vascular dialysis catheter, initial encounter: Secondary | ICD-10-CM | POA: Diagnosis not present

## 2021-05-16 DIAGNOSIS — Z923 Personal history of irradiation: Secondary | ICD-10-CM | POA: Diagnosis not present

## 2021-05-16 DIAGNOSIS — R195 Other fecal abnormalities: Secondary | ICD-10-CM | POA: Diagnosis not present

## 2021-05-16 DIAGNOSIS — R110 Nausea: Secondary | ICD-10-CM | POA: Diagnosis not present

## 2021-05-16 DIAGNOSIS — H40033 Anatomical narrow angle, bilateral: Secondary | ICD-10-CM | POA: Diagnosis not present

## 2021-05-16 DIAGNOSIS — Z79899 Other long term (current) drug therapy: Secondary | ICD-10-CM | POA: Diagnosis not present

## 2021-05-16 DIAGNOSIS — M25511 Pain in right shoulder: Secondary | ICD-10-CM | POA: Diagnosis not present

## 2021-05-16 DIAGNOSIS — K046 Periapical abscess with sinus: Secondary | ICD-10-CM | POA: Diagnosis present

## 2021-05-16 DIAGNOSIS — J019 Acute sinusitis, unspecified: Secondary | ICD-10-CM | POA: Diagnosis not present

## 2021-05-16 DIAGNOSIS — C9002 Multiple myeloma in relapse: Secondary | ICD-10-CM | POA: Diagnosis not present

## 2021-05-16 DIAGNOSIS — D701 Agranulocytosis secondary to cancer chemotherapy: Secondary | ICD-10-CM | POA: Diagnosis present

## 2021-05-16 DIAGNOSIS — C9000 Multiple myeloma not having achieved remission: Secondary | ICD-10-CM | POA: Diagnosis not present

## 2021-05-16 DIAGNOSIS — Z9221 Personal history of antineoplastic chemotherapy: Secondary | ICD-10-CM | POA: Diagnosis not present

## 2021-05-16 DIAGNOSIS — Z9484 Stem cells transplant status: Secondary | ICD-10-CM | POA: Diagnosis not present

## 2021-05-16 DIAGNOSIS — D649 Anemia, unspecified: Secondary | ICD-10-CM | POA: Diagnosis not present

## 2021-05-16 DIAGNOSIS — H538 Other visual disturbances: Secondary | ICD-10-CM | POA: Diagnosis not present

## 2021-05-16 DIAGNOSIS — I10 Essential (primary) hypertension: Secondary | ICD-10-CM | POA: Diagnosis not present

## 2021-05-16 DIAGNOSIS — C9001 Multiple myeloma in remission: Secondary | ICD-10-CM | POA: Diagnosis not present

## 2021-05-16 DIAGNOSIS — T451X5A Adverse effect of antineoplastic and immunosuppressive drugs, initial encounter: Secondary | ICD-10-CM | POA: Diagnosis present

## 2021-05-16 DIAGNOSIS — R918 Other nonspecific abnormal finding of lung field: Secondary | ICD-10-CM | POA: Diagnosis not present

## 2021-05-16 DIAGNOSIS — R0902 Hypoxemia: Secondary | ICD-10-CM | POA: Diagnosis present

## 2021-05-16 NOTE — Progress Notes (Signed)
Deleting Cycle 5 day 15 per Dr. Antonieta Pert instructions. ?Patient will resume with Cycle 6 day 1 on 05/23/21. ?

## 2021-05-17 DIAGNOSIS — Z452 Encounter for adjustment and management of vascular access device: Secondary | ICD-10-CM | POA: Diagnosis not present

## 2021-05-17 DIAGNOSIS — C9000 Multiple myeloma not having achieved remission: Secondary | ICD-10-CM | POA: Diagnosis not present

## 2021-05-23 ENCOUNTER — Inpatient Hospital Stay: Payer: Medicare Other

## 2021-05-23 ENCOUNTER — Inpatient Hospital Stay (HOSPITAL_BASED_OUTPATIENT_CLINIC_OR_DEPARTMENT_OTHER): Payer: Medicare Other | Admitting: Hematology & Oncology

## 2021-05-23 DIAGNOSIS — C9001 Multiple myeloma in remission: Secondary | ICD-10-CM

## 2021-05-23 DIAGNOSIS — Z9221 Personal history of antineoplastic chemotherapy: Secondary | ICD-10-CM | POA: Diagnosis not present

## 2021-05-23 DIAGNOSIS — C9000 Multiple myeloma not having achieved remission: Secondary | ICD-10-CM

## 2021-05-23 DIAGNOSIS — Z5112 Encounter for antineoplastic immunotherapy: Secondary | ICD-10-CM | POA: Diagnosis not present

## 2021-05-23 DIAGNOSIS — Z79811 Long term (current) use of aromatase inhibitors: Secondary | ICD-10-CM | POA: Diagnosis not present

## 2021-05-23 DIAGNOSIS — D500 Iron deficiency anemia secondary to blood loss (chronic): Secondary | ICD-10-CM | POA: Diagnosis not present

## 2021-05-23 DIAGNOSIS — C541 Malignant neoplasm of endometrium: Secondary | ICD-10-CM

## 2021-05-23 LAB — CBC WITH DIFFERENTIAL (CANCER CENTER ONLY)
Abs Immature Granulocytes: 0.04 10*3/uL (ref 0.00–0.07)
Basophils Absolute: 0.1 10*3/uL (ref 0.0–0.1)
Basophils Relative: 1 %
Eosinophils Absolute: 0.1 10*3/uL (ref 0.0–0.5)
Eosinophils Relative: 1 %
HCT: 28 % — ABNORMAL LOW (ref 36.0–46.0)
Hemoglobin: 9.2 g/dL — ABNORMAL LOW (ref 12.0–15.0)
Immature Granulocytes: 1 %
Lymphocytes Relative: 13 %
Lymphs Abs: 0.7 10*3/uL (ref 0.7–4.0)
MCH: 27.6 pg (ref 26.0–34.0)
MCHC: 32.9 g/dL (ref 30.0–36.0)
MCV: 84.1 fL (ref 80.0–100.0)
Monocytes Absolute: 0.7 10*3/uL (ref 0.1–1.0)
Monocytes Relative: 13 %
Neutro Abs: 3.8 10*3/uL (ref 1.7–7.7)
Neutrophils Relative %: 71 %
Platelet Count: 163 10*3/uL (ref 150–400)
RBC: 3.33 MIL/uL — ABNORMAL LOW (ref 3.87–5.11)
RDW: 18.4 % — ABNORMAL HIGH (ref 11.5–15.5)
WBC Count: 5.3 10*3/uL (ref 4.0–10.5)
nRBC: 0 % (ref 0.0–0.2)

## 2021-05-23 LAB — CMP (CANCER CENTER ONLY)
ALT: 14 U/L (ref 0–44)
AST: 13 U/L — ABNORMAL LOW (ref 15–41)
Albumin: 4.1 g/dL (ref 3.5–5.0)
Alkaline Phosphatase: 50 U/L (ref 38–126)
Anion gap: 9 (ref 5–15)
BUN: 20 mg/dL (ref 8–23)
CO2: 28 mmol/L (ref 22–32)
Calcium: 9.6 mg/dL (ref 8.9–10.3)
Chloride: 103 mmol/L (ref 98–111)
Creatinine: 0.96 mg/dL (ref 0.44–1.00)
GFR, Estimated: >60 mL/min (ref >60)
Glucose, Bld: 108 mg/dL — ABNORMAL HIGH (ref 70–99)
Potassium: 3 mmol/L — ABNORMAL LOW (ref 3.5–5.1)
Sodium: 140 mmol/L (ref 135–145)
Total Bilirubin: 0.4 mg/dL (ref 0.3–1.2)
Total Protein: 6.7 g/dL (ref 6.5–8.1)

## 2021-05-23 MED ORDER — SODIUM CHLORIDE 0.9 % IV SOLN: INTRAVENOUS | Status: DC

## 2021-05-23 MED ORDER — POTASSIUM CHLORIDE CRYS ER 20 MEQ PO TBCR
40.0000 meq | EXTENDED_RELEASE_TABLET | Freq: Once | ORAL | Status: AC
  Administered 2021-05-23: 40 meq via ORAL
  Filled 2021-05-23: qty 2

## 2021-05-23 MED ORDER — POMALIDOMIDE 2 MG PO CAPS: 2.0000 mg | ORAL_CAPSULE | Freq: Every day | ORAL | 3 refills | Status: DC

## 2021-05-23 MED ORDER — CEFDINIR 300 MG PO CAPS: 600.0000 mg | ORAL_CAPSULE | Freq: Every day | ORAL | 0 refills | Status: DC

## 2021-05-23 MED ORDER — SODIUM CHLORIDE 0.9 % IV SOLN
20.0000 mg | Freq: Once | INTRAVENOUS | Status: AC
  Administered 2021-05-23: 20 mg via INTRAVENOUS
  Filled 2021-05-23: qty 20

## 2021-05-23 MED ORDER — HEPARIN SOD (PORK) LOCK FLUSH 100 UNIT/ML IV SOLN
500.0000 [IU] | Freq: Once | INTRAVENOUS | Status: AC | PRN
  Administered 2021-05-23: 500 [IU]

## 2021-05-23 MED ORDER — SODIUM CHLORIDE 0.9% FLUSH
10.0000 mL | INTRAVENOUS | Status: DC | PRN
  Administered 2021-05-23: 10 mL

## 2021-05-23 MED ORDER — DEXTROSE 5 % IV SOLN
2.0000 g | Freq: Once | INTRAVENOUS | Status: AC
  Administered 2021-05-23: 2 g via INTRAVENOUS
  Filled 2021-05-23: qty 20

## 2021-05-23 MED ORDER — HYDROCODONE BIT-HOMATROP MBR 5-1.5 MG/5ML PO SOLN
5.0000 mL | Freq: Four times a day (QID) | ORAL | 0 refills | Status: DC | PRN
Start: 2021-05-23 — End: 2021-09-29

## 2021-05-23 NOTE — Progress Notes (Signed)
No treatment today per order of Dr. Marin Olp. Pt to receive IV Rocephin and IV Decadron today per Dr. Marin Olp.  ?

## 2021-05-23 NOTE — Progress Notes (Signed)
DISCONTINUE OFF PATHWAY REGIMEN - Multiple Myeloma and Other Plasma Cell Dyscrasias ? ? ?[id number]:Belantamab mafodotin 2.5 mg/kg IV D1 q21 Days: ?  A cycle is every 21 days: ?    Belantamab mafodotin-blmf  ? ?**Always confirm dose/schedule in your pharmacy ordering system** ? ?REASON: Disease Progression ?PRIOR TREATMENT: Off Pathway: Belantamab mafodotin 2.5 mg/kg IV D1 q21 Days ?TREATMENT RESPONSE: Progressive Disease (PD) ? ?START OFF PATHWAY REGIMEN - Multiple Myeloma and Other Plasma Cell Dyscrasias ? ? ?[id number]:CyBord (Cyclophosphamide 300 mg/m2 IV Weekly + Bortezomib 1.5 mg/m2 IV Weekly + Dexamethasone 40 mg IV Weekly) q28 Days: ?  A cycle is every 28 days: ?    Bortezomib  ?    Cyclophosphamide  ?    Dexamethasone  ? ?**Always confirm dose/schedule in your pharmacy ordering system** ? ?Patient Characteristics: ?Multiple Myeloma, Relapsed / Refractory, [place] [facility], Candidate for CAR T-Cell Therapy ?Disease Classification: Multiple Myeloma ?R-ISS Staging: Unknown ?Therapeutic Status: Refractory ?Line of Therapy: [facility] ?Intent of Therapy: ?Non-Curative / Palliative Intent, Discussed with Patient ?

## 2021-05-23 NOTE — Patient Instructions (Signed)
Ceftriaxone Injection ?What is this medication? ?CEFTRIAXONE (sef try AX one) treats infections caused by bacteria. It belongs to a group of medications called cephalosporin antibiotics. It will not treat colds, the flu, or infections caused by viruses. ?This medicine may be used for other purposes; ask your health care provider or pharmacist if you have questions. ?COMMON BRAND NAME(S): Ceftrisol Plus, Rocephin ?What should I tell my care team before I take this medication? ?They need to know if you have any of these conditions: ?Bleeding disorder ?High bilirubin level in newborn patients ?Kidney disease ?Liver disease ?Poor nutrition ?An unusual or allergic reaction to ceftriaxone, other penicillin or cephalosporin antibiotics, other medicines, foods, dyes, or preservatives ?Pregnant or trying to get pregnant ?Breast-feeding ?How should I use this medication? ?This medication is injected into a vein or into a muscle. It is usually given by a health care provider in a hospital or clinic setting. It may also be given at home. ?If you get this medication at home, you will be taught how to prepare and give it. Use exactly as directed. Take it as directed on the prescription label at the same time every day. Take all of this medication unless your care team tells you to stop it early. Keep taking it even if you think you are better. ?It is important that you put your used needles and syringes in a special sharps container. Do not put them in a trash can. If you do not have a sharps container, call your care team to get one. ?Talk to your care team about the use of this medication in children. While it may be prescribed for children as young as newborns for selected conditions, precautions do apply. ?Overdosage: If you think you have taken too much of this medicine contact a poison control center or emergency room at once. ?NOTE: This medicine is only for you. Do not share this medicine with others. ?What if I miss a  dose? ?If you get this medication at the hospital or clinic: It is important not to miss your dose. Call your care team if you are unable to keep an appointment. ?If you give yourself this medication at home: If you miss a dose, take it as soon as you can. Then continue your normal schedule. If it is almost time for your next dose, take only that dose. Do not take double or extra doses. Call your care team with questions. ?What may interact with this medication? ?Birth control pills ?Intravenous calcium ?This list may not describe all possible interactions. Give your health care provider a list of all the medicines, herbs, non-prescription drugs, or dietary supplements you use. Also tell them if you smoke, drink alcohol, or use illegal drugs. Some items may interact with your medicine. ?What should I watch for while using this medication? ?Tell your care team if your symptoms do not start to get better or if they get worse. ?Do not treat diarrhea with over the counter products. Contact your care team if you have diarrhea that lasts more than 2 days or if it is severe and watery. ?If you have diabetes, you may get a false-positive result for sugar in your urine. Check with your care team. ?If you are being treated for a sexually transmitted disease (STD), avoid sexual contact until you have finished your treatment. Your sexual partner may also need treatment. ?What side effects may I notice from receiving this medication? ?Side effects that you should report to your care team as soon  as possible: ?Allergic reactions--skin rash, itching, hives, swelling of the face, lips, tongue, or throat ?Confusion ?Drowsiness ?Gallbladder problems--severe stomach pain, nausea, vomiting, fever ?Kidney injury--decrease in the amount of urine, swelling of the ankles, hands, or feet ?Kidney stones--blood in the urine, pain or trouble passing urine, pain in the lower back or sides ?Low red blood cell count--unusual weakness or fatigue,  dizziness, headache, trouble breathing ?Pancreatitis--severe stomach pain that spreads to your back or gets worse after eating or when touched, fever, nausea, vomiting ?Seizures ?Severe diarrhea, fever ?Unusual weakness or fatigue ?Side effects that usually do not require medical attention (report to your care team if they continue or are bothersome): ?Diarrhea ?This list may not describe all possible side effects. Call your doctor for medical advice about side effects. You may report side effects to FDA at 1-800-FDA-1088. ?Where should I keep my medication? ?Keep out of the reach of children and pets. ?You will be instructed on how to store this medication. Get rid of any unused medication after the expiration date. ?To get rid of medications that are no longer needed or have expired: ?Take the medication to a medication take-back program. Check with your pharmacy or law enforcement to find a location. ?If you cannot return the medication, ask your care team how to get rid of this medication safely. ?NOTE: This sheet is a summary. It may not cover all possible information. If you have questions about this medicine, talk to your doctor, pharmacist, or health care provider. ?? 2023 [employer]vier/Gold Standard (2020-02-24 00:00:00) ? ?

## 2021-05-23 NOTE — Progress Notes (Signed)
?Hematology and Oncology Follow Up Visit ? ?Emily Greer ?[id number] ?05-10-1950 71 y.o. ?05/23/2021 ? ? ?Principle Diagnosis:  ?Recurrent lambda light chain myeloma - nl cytogenetics ?History of recurrent endometrial carcinoma ?Iron deficiency anemia -blood loss ?  ?Past Therapy:             ?Status post second autologous stem cell transplant on 07/24/2014 ?Maintenance therapy with Pomalidomide/every 2 week Velcade - d/c'ed ?Radiation therapy for endometrial recurrence - completed 04/20/2015 ?Pomalyst/Kyprolis 70mg /m2 IV q 2 weeks - s/p cycle #12 - held on 12/26/2017 for hematuria ?Daratumumab/Pomalyst (1 mg) - status post cycle 19 -- d/c on 08/21/2019 ?Melflufen 40 mg IV q 4 weeks -- started on 08/27/2019, s/p cycle #2 --  D/c due to FDA removal ?Selinexor 60 mg po q week -- start on 01/19/2020 -- changed on 03/18/2020 -- d/c on 04/27/2020 ?  ?Current Therapy:        ?Blenrep 2.5 mg/m2 IV q 3 weeks -- started on 05/20/2020, s/p cycle #7 --D/C secondary to progression on 12/22/2020 ?Carfilzomib/Sarclisa -- s/p cycle #3 -- start on 01/03/2021 ?Femara 2.5 mg po q day  ?IV iron-Feraheme given on 05/03/2021. ?  ?Interim History:  Emily Greer is here today for follow-up.  She did have her lymphocyte collection for the CAR-T protocol that she is on down at [place].  Everything went well.  Unfortunate, she now has a cough.  It is not productive.  It is bothering her quite a bit. ? ?We really need to give her a week off before we start any new treatment on her.  Dr. Dorothea Ogle would like her on treatment with Cytoxan/pomalidomide/Decadron.  This will be a "bridge" therapy until her actual CAR-T infusion.  This will be sometime in June I guess. ? ?Her last light chain levels were going up.  Back in late March, her lambda light chain was 12.1 mg/dL. ? ?She is not complaining of any pain.  She has had no problems with bowels or bladder.  She has had no diarrhea.  She has had no leg swelling.  Is been no rashes. ? ?Overall,  I would have to say that her performance status is probably ECOG 1.   ? ? ?Medications:  ?Allergies as of 05/23/2021   ? ?   Reactions  ? Codeine Nausea Only  ? ?  ? ?  ?Medication List  ?  ? ?  ? Accurate as of May 23, 2021  9:05 AM. If you have any questions, ask your nurse or doctor.  ?  ?  ? ?  ? ?STOP taking these medications   ? ?penicillin v potassium 500 MG tablet ?Commonly known as: VEETID ?Stopped by: Josph Macho, MD ?  ? ?  ? ?TAKE these medications   ? ?acyclovir ointment 5 % ?Commonly known as: Zovirax ?Apply thin layer to affected area two to three times daily prn ?  ?albuterol 108 (90 Base) MCG/ACT inhaler ?Commonly known as: VENTOLIN HFA ?Inhale 2 puffs into the lungs every 6 (six) hours as needed for wheezing or shortness of breath. 2 puffs 3 times daily x 5 days then every 6 hours as needed. ?  ?amLODipine 10 MG tablet ?Commonly known as: NORVASC ?TAKE 1 TABLET(10 MG) BY MOUTH EVERY MORNING ?  ?aspirin EC 81 MG tablet ?Take 81 mg by mouth daily. ?  ?azelastine 0.1 % nasal spray ?Commonly known as: ASTELIN ?Place 1 spray into both nostrils 2 (two) times daily. Use in each nostril as directed ?  ?  CALCIUM 1200+D3 PO ?Take 1 tablet by mouth daily. ?  ?cetirizine 10 MG tablet ?Commonly known as: ZYRTEC ?Take 1 tablet (10 mg total) by mouth at bedtime. ?  ?chlorhexidine 0.12 % solution ?Commonly known as: PERIDEX ?10 mLs 2 (two) times daily. ?  ?Fluocinolone Acetonide Scalp 0.01 % Oil ?Apply topically. ?  ?hydrochlorothiazide 12.5 MG capsule ?Commonly known as: MICROZIDE ?Take 12.5 mg by mouth every morning. ?  ?letrozole 2.5 MG tablet ?Commonly known as: Femara ?Take 1 tablet (2.5 mg total) by mouth daily. ?  ?lidocaine 5 % ?Commonly known as: LIDODERM ?Place 1 patch onto the skin daily. Remove & Discard patch within 12 hours or as directed by MD ?  ?lidocaine-prilocaine cream ?Commonly known as: EMLA ?Apply 1 application topically as needed. ?  ?metoprolol succinate 50 MG 24 hr tablet ?Commonly  known as: TOPROL-XL ?TAKE 1 TABLET(50 MG) BY MOUTH EVERY MORNING ?  ?montelukast 10 MG tablet ?Commonly known as: SINGULAIR ?TAKE 1 TABLET(10 MG) BY MOUTH AT BEDTIME ?  ?ondansetron 8 MG tablet ?Commonly known as: Zofran ?Take 1 tablet (8 mg total) by mouth 2 (two) times daily as needed (Nausea or vomiting). ?  ?potassium chloride SA 20 MEQ tablet ?Commonly known as: KLOR-CON M ?TAKE 1 TABLET(20 MEQ) BY MOUTH TWICE DAILY ?  ?PROBIOTIC DAILY PO ?Take by mouth daily. ?  ?triamcinolone 55 MCG/ACT Aero nasal inhaler ?Commonly known as: NASACORT ?Place 2 sprays into the nose as needed. ?  ?triamterene-hydrochlorothiazide 37.5-25 MG tablet ?Commonly known as: MAXZIDE-25 ?1 tablet by mouth at bedtime ?  ?Vitamin D3 50 MCG (2000 UT) Tabs ?Take 2 tablets by mouth daily. ?  ?Xiidra 5 % Soln ?Generic drug: Lifitegrast ?  ?zinc gluconate 50 MG tablet ?Take 50 mg by mouth daily. ?  ? ?  ? ? ?Allergies:  ?Allergies  ?Allergen Reactions  ? Codeine Nausea Only  ? ? ?Past Medical History, Surgical history, Social history, and Family History were reviewed and updated. ? ?Review of Systems: ?Review of Systems  ?Constitutional: Negative.   ?HENT: Negative.    ?Eyes:  Positive for blurred vision.  ?Respiratory: Negative.    ?Cardiovascular: Negative.   ?Gastrointestinal: Negative.   ?Genitourinary: Negative.   ?Musculoskeletal: Negative.   ?Skin: Negative.   ?Neurological: Negative.   ?Endo/Heme/Allergies: Negative.   ?Psychiatric/Behavioral: Negative.    ? ? ?Physical Exam: ? vitals were not taken for this visit.  ? ?Wt Readings from Last 3 Encounters:  ?05/23/21 175 lb (79.4 kg)  ?05/05/21 177 lb 9.6 oz (80.6 kg)  ?04/25/21 177 lb 1.9 oz (80.3 kg)  ? ? ?Physical Exam ?Vitals reviewed.  ?HENT:  ?   Head: Normocephalic and atraumatic.  ?Eyes:  ?   Pupils: Pupils are equal, round, and reactive to light.  ?Cardiovascular:  ?   Rate and Rhythm: Normal rate and regular rhythm.  ?   Heart sounds: Normal heart sounds.  ?Pulmonary:  ?    Effort: Pulmonary effort is normal.  ?   Breath sounds: Normal breath sounds.  ?Abdominal:  ?   General: Bowel sounds are normal.  ?   Palpations: Abdomen is soft.  ?Musculoskeletal:     ?   General: No tenderness or deformity. Normal range of motion.  ?   Cervical back: Normal range of motion.  ?Lymphadenopathy:  ?   Cervical: No cervical adenopathy.  ?Skin: ?   General: Skin is warm and dry.  ?   Findings: No erythema or rash.  ?Neurological:  ?  Mental Status: She is alert and oriented to person, place, and time.  ?Psychiatric:     ?   Behavior: Behavior normal.     ?   Thought Content: Thought content normal.     ?   Judgment: Judgment normal.  ? ? ? ?Lab Results  ?Component Value Date  ? WBC 4.3 05/03/2021  ? HGB 8.8 (L) 05/03/2021  ? HCT 27.6 (L) 05/03/2021  ? MCV 86.3 05/03/2021  ? PLT 131 (L) 05/03/2021  ? ?Lab Results  ?Component Value Date  ? FERRITIN 2,494 (H) 05/03/2021  ? IRON 62 05/03/2021  ? TIBC 344 05/03/2021  ? UIBC 282 05/03/2021  ? IRONPCTSAT 18 05/03/2021  ? ?Lab Results  ?Component Value Date  ? RETICCTPCT 3.6 (H) 05/03/2021  ? RBC 3.24 (L) 05/03/2021  ? ?Lab Results  ?Component Value Date  ? KPAFRELGTCHN 4.1 04/25/2021  ? LAMBDASER 121.4 (H) 04/25/2021  ? KAPLAMBRATIO 0.03 (L) 04/25/2021  ? ?Lab Results  ?Component Value Date  ? IGGSERUM 305 (L) 04/25/2021  ? IGA 14 (L) 04/25/2021  ? IGMSERUM <5 (L) 04/25/2021  ? ?Lab Results  ?Component Value Date  ? TOTALPROTELP 6.3 04/12/2021  ? ALBUMINELP 3.6 04/12/2021  ? A1GS 0.3 04/12/2021  ? A2GS 1.0 04/12/2021  ? BETS 1.0 04/12/2021  ? BETA2SER 0.4 11/23/2014  ? GAMS 0.4 04/12/2021  ? MSPIKE 0.1 (H) 04/12/2021  ? SPEI Comment 10/19/2020  ? ?  Chemistry   ?   ?Component Value Date/Time  ? NA 142 05/03/2021 0817  ? NA 141 01/10/2017 1115  ? NA 140 06/21/2016 0918  ? K 3.3 (L) 05/03/2021 0817  ? K 4.0 01/10/2017 1115  ? K 4.3 06/21/2016 0918  ? CL 105 05/03/2021 0817  ? CL 106 01/10/2017 1115  ? CO2 28 05/03/2021 0817  ? CO2 27 01/10/2017 1115  ? CO2 20  (L) 06/21/2016 1610  ? BUN 10 05/03/2021 0817  ? BUN 15 01/10/2017 1115  ? BUN 15.8 06/21/2016 0918  ? CREATININE 0.85 05/03/2021 0817  ? CREATININE 1.0 01/10/2017 1115  ? CREATININE 0.8 06/21/2016 0918  ?

## 2021-05-23 NOTE — Patient Instructions (Signed)

## 2021-05-23 NOTE — Progress Notes (Signed)
Dr. Marin Olp notified of K-3.0.  Order received for pt to get 40 meq of oral potassium now prior to discharge today per order of Dr. Marin Olp.  ?

## 2021-05-24 ENCOUNTER — Telehealth: Payer: Self-pay

## 2021-05-24 NOTE — Telephone Encounter (Signed)
Re-Enrollment [employer] paperwork for Pomalyst completed and faxed with confirmation to [facility]. dph ?

## 2021-05-25 ENCOUNTER — Telehealth: Payer: Medicare Other

## 2021-05-25 ENCOUNTER — Ambulatory Visit (INDEPENDENT_AMBULATORY_CARE_PROVIDER_SITE_OTHER): Payer: BC Managed Care – PPO

## 2021-05-25 DIAGNOSIS — E6609 Other obesity due to excess calories: Secondary | ICD-10-CM

## 2021-05-25 DIAGNOSIS — C9000 Multiple myeloma not having achieved remission: Secondary | ICD-10-CM

## 2021-05-25 DIAGNOSIS — I10 Essential (primary) hypertension: Secondary | ICD-10-CM

## 2021-05-26 NOTE — Chronic Care Management (AMB) (Signed)
?Chronic Care Management  ? ?CCM RN Visit Note ? ?05/25/2021 ?Name: Emily Greer MRN: [id number] DOB: [birth year] ? ?Subjective: ?Emily Greer is a 71 y.o. year old female who is a primary care patient of [name], MD. The care management team was consulted for assistance with disease management and care coordination needs.   ? ?Engaged with patient by telephone for follow up visit in response to provider referral for case management and/or care coordination services.  ? ?Consent to Services:  ?The patient was given information about Chronic Care Management services, agreed to services, and gave verbal consent prior to initiation of services.  Please see initial visit note for detailed documentation.  ? ?Patient agreed to services and verbal consent obtained.  ? ?Assessment: Review of patient past medical history, allergies, medications, health status, including review of consultants reports, laboratory and other test data, was performed as part of comprehensive evaluation and provision of chronic care management services.  ? ?SDOH (Social Determinants of Health) assessments and interventions performed:  Yes, no acute needs ? ?CCM Care Plan ? ?Allergies  ?Allergen Reactions  ? Codeine Nausea Only  ? ? ?Outpatient Encounter Medications as of 05/25/2021  ?Medication Sig Note  ? acyclovir ointment (ZOVIRAX) 5 % Apply thin layer to affected area two to three times daily prn   ? albuterol (VENTOLIN HFA) 108 (90 Base) MCG/ACT inhaler Inhale 2 puffs into the lungs every 6 (six) hours as needed for wheezing or shortness of breath. 2 puffs 3 times daily x 5 days then every 6 hours as needed.   ? amLODipine (NORVASC) 10 MG tablet TAKE 1 TABLET(10 MG) BY MOUTH EVERY MORNING   ? aspirin EC 81 MG tablet Take 81 mg by mouth daily.   ? azelastine (ASTELIN) 0.1 % nasal spray Place 1 spray into both nostrils 2 (two) times daily. Use in each nostril as directed   ? Calcium-Magnesium-Vitamin D (CALCIUM 1200+D3 PO) Take 1  tablet by mouth daily.   ? cefdinir (OMNICEF) 300 MG capsule Take 2 capsules (600 mg total) by mouth daily.   ? cetirizine (ZYRTEC) 10 MG tablet Take 1 tablet (10 mg total) by mouth at bedtime.   ? chlorhexidine (PERIDEX) 0.12 % solution 10 mLs 2 (two) times daily.   ? Cholecalciferol (VITAMIN D3) 2000 UNITS TABS Take 2 tablets by mouth daily. 05/20/2020: Patient stated she is taking this twice a day.  ? Fluocinolone Acetonide Scalp 0.01 % OIL Apply topically.   ? hydrochlorothiazide (MICROZIDE) 12.5 MG capsule Take 12.5 mg by mouth every morning.   ? HYDROcodone bit-homatropine (HYCODAN) 5-1.5 MG/5ML syrup Take 5 mLs by mouth every 6 (six) hours as needed for cough.   ? letrozole (FEMARA) 2.5 MG tablet Take 1 tablet (2.5 mg total) by mouth daily.   ? lidocaine (LIDODERM) 5 % Place 1 patch onto the skin daily. Remove & Discard patch within 12 hours or as directed by MD   ? lidocaine-prilocaine (EMLA) cream Apply 1 application topically as needed.   ? metoprolol succinate (TOPROL-XL) 50 MG 24 hr tablet TAKE 1 TABLET(50 MG) BY MOUTH EVERY MORNING   ? montelukast (SINGULAIR) 10 MG tablet TAKE 1 TABLET(10 MG) BY MOUTH AT BEDTIME   ? ondansetron (ZOFRAN) 8 MG tablet Take 1 tablet (8 mg total) by mouth 2 (two) times daily as needed (Nausea or vomiting). (Patient not taking: Reported on 05/23/2021)   ? pomalidomide (POMALYST) 2 MG capsule Take 1 capsule (2 mg total) by mouth daily. Activity [employer]  Auth #      Date Obtained   ? potassium chloride SA (KLOR-CON) 20 MEQ tablet TAKE 1 TABLET(20 MEQ) BY MOUTH TWICE DAILY   ? Probiotic Product (PROBIOTIC DAILY PO) Take by mouth daily.   ? triamcinolone (NASACORT) 55 MCG/ACT AERO nasal inhaler Place 2 sprays into the nose as needed.    ? triamterene-hydrochlorothiazide (MAXZIDE-25) 37.5-25 MG tablet 1 tablet by mouth at bedtime   ? XIIDRA 5 % SOLN    ? zinc gluconate 50 MG tablet Take 50 mg by mouth daily.   ? ?Facility-Administered Encounter Medications as of 05/25/2021   ?Medication  ? 0.9 %  sodium chloride infusion  ? albuterol (PROVENTIL) (2.5 MG/3ML) 0.083% nebulizer solution 2.5 mg  ? sodium chloride flush (NS) 0.9 % injection 10 mL  ? sodium chloride flush (NS) 0.9 % injection 10 mL  ? sodium chloride flush (NS) 0.9 % injection 10 mL  ? sodium chloride flush (NS) 0.9 % injection 10 mL  ? sodium chloride flush (NS) 0.9 % injection 10 mL  ? ? ?Patient Active Problem List  ? Diagnosis Date Noted  ? Mastalgia in female 12/29/2020  ? Class 1 obesity due to excess calories with serious comorbidity and body mass index (BMI) of 30.0 to 30.9 in adult 12/29/2020  ? Goals of care, counseling/discussion 08/21/2019  ? Osteonecrosis, unspecified (HCC) 03/31/2019  ? VAIN I (vaginal intraepithelial neoplasia grade I) 10/10/2018  ? Influenza B 02/18/2018  ? Flu-like symptoms 02/18/2018  ? Excessive cerumen in both ear canals 02/18/2018  ? Abnormal Pap smear of vagina 12/30/2015  ? 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 Iron deficiency anemia due to chronic blood loss   ? HCAP (healthcare-associated pneumonia) 12/29/2014  ? UTI (lower urinary tract infection) 12/29/2014  ? Hypokalemia 12/29/2014  ? CAP (community acquired pneumonia) 12/29/2014  ? Bone marrow transplant status (HCC) 07/25/2014  ? S/P autologous bone marrow transplantation (HCC) 07/25/2014  ? Fever 05/31/2014  ? ARF (acute renal failure) (HCC) 05/31/2014  ? Anemia 05/31/2014  ? Renal failure   ? Examination of participant in clinical trial 05/19/2014  ? Encounter for examination for normal comparison or control in clinical research program 05/19/2014  ? Kahler disease (HCC) 05/05/2014  ? Multiple myeloma in remission (HCC) 05/05/2014  ? History of radiation therapy   ? Endometrial carcinoma (HCC) 09/28/2011  ? Pregnancy induced hypertension   ? Elevated hemoglobin A1c   ? Myeloma (HCC) 01/17/2011  ? Lambda light chain myeloma (HCC) 11/11/2007  ? Vaginal atrophy 12/19/2006  ?  Increased BMI 12/18/2005  ? Hypertension 11/28/1997  ? H/O multiple myeloma 11/28/1997  ? Staphylococcus aureus bacteremia 11/28/1997  ? Staphylococcus epidermidis bacteremia 11/28/1997  ? Essential (primary) hypertension 11/28/1997  ? ? ?Conditions to be addressed/monitored: Essential Hypertension, Class 1 Obesity, Multiple Myeloma ? ?Care Plan : RN Care Manager Plan of Care  ?Updates made by Riley Churches, RN since 05/25/2021 12:00 AM  ?  ? ?Problem: No Plan of Care established for management of chronic disease states (Essential Hypertension, Class 1 Obesity, Multiple Myeloma)   ?Priority: High  ?  ? ?Long-Range Goal: Establishment of Plan of Care for management of chronic disease states (Essential Hypertension, Class 1 Obesity, Multiple Myeloma)   ?Start Date: 02/14/2021  ?Expected End Date: 02/14/2022  ?Recent Progress: On track  ?Priority: High  ?Note:   ?Current Barriers:  ?Knowledge Deficits related to plan of care for management of Essential Hypertension, Class 1 Obesity,  Multiple Myeloma  ?Chronic Disease Management support and education needs related to Essential Hypertension, Class 1 Obesity, Multiple Myeloma  ? ?RNCM Clinical Goal(s):  ?Patient will verbalize basic understanding of  Essential Hypertension, Class 1 Obesity, Multiple Myeloma disease process and self health management plan as evidenced by patient will report having no disease exacerbations related to her chronic disease states as listed above  ?take all medications exactly as prescribed and will call provider for medication related questions as evidenced by patient will report having no missed doses of her prescribed medications  ?demonstrate Ongoing health management independence as evidenced by patient will report being 100% adherent to her prescribed treatment plan ?continue to work with RN Care Manager to address care management and care coordination needs related to  Essential Hypertension, Class 1 Obesity, Multiple Myeloma as evidenced  by adherence to CM Team Scheduled appointments ?demonstrate ongoing self health care management ability   as evidenced by    through collaboration with RN Care manager, provider, and care team.  ? ?Interventions: ?1:1

## 2021-05-26 NOTE — Patient Instructions (Signed)
Visit Information ? ?Thank you for taking time to visit with me today. Please don't hesitate to contact me if I can be of assistance to you before our next scheduled telephone appointment. ? ?Following are the goals we discussed today:  ?(Copy and paste patient goals from clinical care plan here) ? ?Our next appointment is by telephone on 07/26/21 at 10:40 AM ? ?Please call the care guide team at [phone] if you need to cancel or reschedule your appointment.  ? ?If you are experiencing a Mental Health or [place] or need someone to talk to, please call [phone]-TALK (toll free, 24 hour hotline)  ? ?Patient verbalizes understanding of instructions and care plan provided today and agrees to view in [place]. Active MyChart status confirmed with patient.   ? ?Barb Merino, RN, BSN, CCM ?Care Management Coordinator ?[place] [facility]  ?Direct Phone: [phone] ? ? ?

## 2021-05-29 DIAGNOSIS — I10 Essential (primary) hypertension: Secondary | ICD-10-CM

## 2021-05-30 DIAGNOSIS — H25813 Combined forms of age-related cataract, bilateral: Secondary | ICD-10-CM | POA: Diagnosis not present

## 2021-05-30 DIAGNOSIS — H16223 Keratoconjunctivitis sicca, not specified as Sjogren's, bilateral: Secondary | ICD-10-CM | POA: Diagnosis not present

## 2021-05-30 DIAGNOSIS — Z79899 Other long term (current) drug therapy: Secondary | ICD-10-CM | POA: Diagnosis not present

## 2021-05-31 ENCOUNTER — Other Ambulatory Visit: Payer: Self-pay | Admitting: *Deleted

## 2021-05-31 DIAGNOSIS — C541 Malignant neoplasm of endometrium: Secondary | ICD-10-CM

## 2021-05-31 DIAGNOSIS — C9001 Multiple myeloma in remission: Secondary | ICD-10-CM

## 2021-05-31 DIAGNOSIS — C9000 Multiple myeloma not having achieved remission: Secondary | ICD-10-CM

## 2021-06-01 ENCOUNTER — Inpatient Hospital Stay: Payer: Medicare Other

## 2021-06-01 ENCOUNTER — Inpatient Hospital Stay: Payer: Medicare Other | Attending: Hematology & Oncology

## 2021-06-01 VITALS — BP 120/58 | HR 64 | Temp 98.3°F | Resp 18

## 2021-06-01 DIAGNOSIS — C9000 Multiple myeloma not having achieved remission: Secondary | ICD-10-CM | POA: Insufficient documentation

## 2021-06-01 DIAGNOSIS — Z5111 Encounter for antineoplastic chemotherapy: Secondary | ICD-10-CM | POA: Insufficient documentation

## 2021-06-01 DIAGNOSIS — C9001 Multiple myeloma in remission: Secondary | ICD-10-CM

## 2021-06-01 DIAGNOSIS — Z79899 Other long term (current) drug therapy: Secondary | ICD-10-CM | POA: Diagnosis not present

## 2021-06-01 DIAGNOSIS — C541 Malignant neoplasm of endometrium: Secondary | ICD-10-CM

## 2021-06-01 LAB — CBC WITH DIFFERENTIAL (CANCER CENTER ONLY)
Abs Immature Granulocytes: 0.04 10*3/uL (ref 0.00–0.07)
Basophils Absolute: 0.1 10*3/uL (ref 0.0–0.1)
Basophils Relative: 1 %
Eosinophils Absolute: 0.1 10*3/uL (ref 0.0–0.5)
Eosinophils Relative: 1 %
HCT: 29.7 % — ABNORMAL LOW (ref 36.0–46.0)
Hemoglobin: 9.6 g/dL — ABNORMAL LOW (ref 12.0–15.0)
Immature Granulocytes: 1 %
Lymphocytes Relative: 8 %
Lymphs Abs: 0.7 10*3/uL (ref 0.7–4.0)
MCH: 27.2 pg (ref 26.0–34.0)
MCHC: 32.3 g/dL (ref 30.0–36.0)
MCV: 84.1 fL (ref 80.0–100.0)
Monocytes Absolute: 0.7 10*3/uL (ref 0.1–1.0)
Monocytes Relative: 8 %
Neutro Abs: 6.7 10*3/uL (ref 1.7–7.7)
Neutrophils Relative %: 81 %
Platelet Count: 206 10*3/uL (ref 150–400)
RBC: 3.53 MIL/uL — ABNORMAL LOW (ref 3.87–5.11)
RDW: 18.8 % — ABNORMAL HIGH (ref 11.5–15.5)
WBC Count: 8.3 10*3/uL (ref 4.0–10.5)
nRBC: 0 % (ref 0.0–0.2)

## 2021-06-01 LAB — CMP (CANCER CENTER ONLY)
ALT: 16 U/L (ref 0–44)
AST: 14 U/L — ABNORMAL LOW (ref 15–41)
Albumin: 4.4 g/dL (ref 3.5–5.0)
Alkaline Phosphatase: 52 U/L (ref 38–126)
Anion gap: 11 (ref 5–15)
BUN: 20 mg/dL (ref 8–23)
CO2: 26 mmol/L (ref 22–32)
Calcium: 10.5 mg/dL — ABNORMAL HIGH (ref 8.9–10.3)
Chloride: 103 mmol/L (ref 98–111)
Creatinine: 0.87 mg/dL (ref 0.44–1.00)
GFR, Estimated: >60 mL/min (ref >60)
Glucose, Bld: 98 mg/dL (ref 70–99)
Potassium: 3.6 mmol/L (ref 3.5–5.1)
Sodium: 140 mmol/L (ref 135–145)
Total Bilirubin: 0.4 mg/dL (ref 0.3–1.2)
Total Protein: 6.8 g/dL (ref 6.5–8.1)

## 2021-06-01 MED ORDER — SODIUM CHLORIDE 0.9 % IV SOLN
20.0000 mg | Freq: Once | INTRAVENOUS | Status: AC
  Administered 2021-06-01: 20 mg via INTRAVENOUS
  Filled 2021-06-01: qty 20

## 2021-06-01 MED ORDER — SODIUM CHLORIDE 0.9% FLUSH
10.0000 mL | INTRAVENOUS | Status: DC | PRN
  Administered 2021-06-01: 10 mL

## 2021-06-01 MED ORDER — HEPARIN SOD (PORK) LOCK FLUSH 100 UNIT/ML IV SOLN
500.0000 [IU] | Freq: Once | INTRAVENOUS | Status: AC | PRN
  Administered 2021-06-01: 500 [IU]

## 2021-06-01 MED ORDER — PROCHLORPERAZINE MALEATE 10 MG PO TABS: 10.0000 mg | ORAL_TABLET | Freq: Four times a day (QID) | ORAL | 1 refills | Status: DC | PRN

## 2021-06-01 MED ORDER — LORAZEPAM 0.5 MG PO TABS: 0.5000 mg | ORAL_TABLET | Freq: Four times a day (QID) | ORAL | 0 refills | Status: DC | PRN

## 2021-06-01 MED ORDER — ONDANSETRON HCL 8 MG PO TABS: 8.0000 mg | ORAL_TABLET | Freq: Two times a day (BID) | ORAL | 1 refills | Status: DC | PRN

## 2021-06-01 MED ORDER — LIDOCAINE-PRILOCAINE 2.5-2.5 % EX CREA: TOPICAL_CREAM | CUTANEOUS | 3 refills | Status: DC

## 2021-06-01 MED ORDER — SODIUM CHLORIDE 0.9 % IV SOLN
300.0000 mg/m2 | Freq: Once | INTRAVENOUS | Status: AC
  Administered 2021-06-01: 560 mg via INTRAVENOUS
  Filled 2021-06-01: qty 28

## 2021-06-01 MED ORDER — PALONOSETRON HCL INJECTION 0.25 MG/5ML
0.2500 mg | Freq: Once | INTRAVENOUS | Status: AC
  Administered 2021-06-01: 0.25 mg via INTRAVENOUS
  Filled 2021-06-01: qty 5

## 2021-06-01 MED ORDER — SODIUM CHLORIDE 0.9 % IV SOLN: Freq: Once | INTRAVENOUS | Status: AC

## 2021-06-01 NOTE — Patient Instructions (Signed)
Cyclophosphamide Injection ?What is this medication? ?CYCLOPHOSPHAMIDE (sye kloe FOSS fa mide) is a chemotherapy drug. It slows the growth of cancer cells. This medicine is used to treat many types of cancer like lymphoma, myeloma, leukemia, breast cancer, and ovarian cancer, to name a few. ?This medicine may be used for other purposes; ask your health care provider or pharmacist if you have questions. ?COMMON BRAND NAME(S): Cyclophosphamide, Cytoxan, Neosar ?What should I tell my care team before I take this medication? ?They need to know if you have any of these conditions: ?heart disease ?history of irregular heartbeat ?infection ?kidney disease ?liver disease ?low blood counts, like white cells, platelets, or red blood cells ?on hemodialysis ?recent or ongoing radiation therapy ?scarring or thickening of the lungs ?trouble passing urine ?an unusual or allergic reaction to cyclophosphamide, other medicines, foods, dyes, or preservatives ?pregnant or trying to get pregnant ?breast-feeding ?How should I use this medication? ?This drug is usually given as an injection into a vein or muscle or by infusion into a vein. It is administered in a hospital or clinic by a specially trained health care professional. ?Talk to your pediatrician regarding the use of this medicine in children. Special care may be needed. ?Overdosage: If you think you have taken too much of this medicine contact a poison control center or emergency room at once. ?NOTE: This medicine is only for you. Do not share this medicine with others. ?What if I miss a dose? ?It is important not to miss your dose. Call your doctor or health care professional if you are unable to keep an appointment. ?What may interact with this medication? ?amphotericin B ?azathioprine ?certain antivirals for HIV or hepatitis ?certain medicines for blood pressure, heart disease, irregular heart beat ?certain medicines that treat or prevent blood clots like warfarin ?certain  other medicines for cancer ?cyclosporine ?etanercept ?indomethacin ?medicines that relax muscles for surgery ?medicines to increase blood counts ?metronidazole ?This list may not describe all possible interactions. Give your health care provider a list of all the medicines, herbs, non-prescription drugs, or dietary supplements you use. Also tell them if you smoke, drink alcohol, or use illegal drugs. Some items may interact with your medicine. ?What should I watch for while using this medication? ?Your condition will be monitored carefully while you are receiving this medicine. ?You may need blood work done while you are taking this medicine. ?Drink water or other fluids as directed. Urinate often, even at night. ?Some products may contain alcohol. Ask your health care professional if this medicine contains alcohol. Be sure to tell all health care professionals you are taking this medicine. Certain medicines, like metronidazole and disulfiram, can cause an unpleasant reaction when taken with alcohol. The reaction includes flushing, headache, nausea, vomiting, sweating, and increased thirst. The reaction can last from 30 minutes to several hours. ?Do not become pregnant while taking this medicine or for 1 year after stopping it. Women should inform their health care professional if they wish to become pregnant or think they might be pregnant. Men should not father a child while taking this medicine and for 4 months after stopping it. There is potential for serious side effects to an unborn child. Talk to your health care professional for more information. ?Do not breast-feed an infant while taking this medicine or for 1 week after stopping it. ?This medicine has caused ovarian failure in some women. This medicine may make it more difficult to get pregnant. Talk to your health care professional if you are  concerned about your fertility. ?This medicine has caused decreased sperm counts in some men. This may make it  more difficult to father a child. Talk to your health care professional if you are concerned about your fertility. ?Call your health care professional for advice if you get a fever, chills, or sore throat, or other symptoms of a cold or flu. Do not treat yourself. This medicine decreases your body's ability to fight infections. Try to avoid being around people who are sick. ?Avoid taking medicines that contain aspirin, acetaminophen, ibuprofen, naproxen, or ketoprofen unless instructed by your health care professional. These medicines may hide a fever. ?Talk to your health care professional about your risk of cancer. You may be more at risk for certain types of cancer if you take this medicine. ?If you are going to need surgery or other procedure, tell your health care professional that you are using this medicine. ?Be careful brushing or flossing your teeth or using a toothpick because you may get an infection or bleed more easily. If you have any dental work done, tell your dentist you are receiving this medicine. ?What side effects may I notice from receiving this medication? ?Side effects that you should report to your doctor or health care professional as soon as possible: ?allergic reactions like skin rash, itching or hives, swelling of the face, lips, or tongue ?breathing problems ?nausea, vomiting ?signs and symptoms of bleeding such as bloody or black, tarry stools; red or dark brown urine; spitting up blood or brown material that looks like coffee grounds; red spots on the skin; unusual bruising or bleeding from the eyes, gums, or nose ?signs and symptoms of heart failure like fast, irregular heartbeat, sudden weight gain; swelling of the ankles, feet, hands ?signs and symptoms of infection like fever; chills; cough; sore throat; pain or trouble passing urine ?signs and symptoms of kidney injury like trouble passing urine or change in the amount of urine ?signs and symptoms of liver injury like dark yellow  or brown urine; general ill feeling or flu-like symptoms; light-colored stools; loss of appetite; nausea; right upper belly pain; unusually weak or tired; yellowing of the eyes or skin ?Side effects that usually do not require medical attention (report to your doctor or health care professional if they continue or are bothersome): ?confusion ?decreased hearing ?diarrhea ?facial flushing ?hair loss ?headache ?loss of appetite ?missed menstrual periods ?signs and symptoms of low red blood cells or anemia such as unusually weak or tired; feeling faint or lightheaded; falls ?skin discoloration ?This list may not describe all possible side effects. Call your doctor for medical advice about side effects. You may report side effects to FDA at 1-800-FDA-1088. ?Where should I keep my medication? ?This drug is given in a hospital or clinic and will not be stored at home. ?NOTE: This sheet is a summary. It may not cover all possible information. If you have questions about this medicine, talk to your doctor, pharmacist, or health care provider. ?? 2023 Elsevier/Gold Standard (2020-12-17 00:00:00) ? ?

## 2021-06-08 ENCOUNTER — Inpatient Hospital Stay: Payer: Medicare Other

## 2021-06-08 DIAGNOSIS — C9001 Multiple myeloma in remission: Secondary | ICD-10-CM

## 2021-06-08 DIAGNOSIS — C9000 Multiple myeloma not having achieved remission: Secondary | ICD-10-CM | POA: Diagnosis not present

## 2021-06-08 DIAGNOSIS — Z79899 Other long term (current) drug therapy: Secondary | ICD-10-CM | POA: Diagnosis not present

## 2021-06-08 DIAGNOSIS — C541 Malignant neoplasm of endometrium: Secondary | ICD-10-CM

## 2021-06-08 DIAGNOSIS — Z5111 Encounter for antineoplastic chemotherapy: Secondary | ICD-10-CM | POA: Diagnosis not present

## 2021-06-08 LAB — CMP (CANCER CENTER ONLY)
ALT: 20 U/L (ref 0–44)
AST: 14 U/L — ABNORMAL LOW (ref 15–41)
Albumin: 4.4 g/dL (ref 3.5–5.0)
Alkaline Phosphatase: 54 U/L (ref 38–126)
Anion gap: 10 (ref 5–15)
BUN: 25 mg/dL — ABNORMAL HIGH (ref 8–23)
CO2: 27 mmol/L (ref 22–32)
Calcium: 9.9 mg/dL (ref 8.9–10.3)
Chloride: 103 mmol/L (ref 98–111)
Creatinine: 0.92 mg/dL (ref 0.44–1.00)
GFR, Estimated: >60 mL/min (ref >60)
Glucose, Bld: 91 mg/dL (ref 70–99)
Potassium: 3.4 mmol/L — ABNORMAL LOW (ref 3.5–5.1)
Sodium: 140 mmol/L (ref 135–145)
Total Bilirubin: 0.4 mg/dL (ref 0.3–1.2)
Total Protein: 6.6 g/dL (ref 6.5–8.1)

## 2021-06-08 LAB — CBC WITH DIFFERENTIAL (CANCER CENTER ONLY)
Abs Immature Granulocytes: 0.06 10*3/uL (ref 0.00–0.07)
Basophils Absolute: 0.1 10*3/uL (ref 0.0–0.1)
Basophils Relative: 1 %
Eosinophils Absolute: 0.1 10*3/uL (ref 0.0–0.5)
Eosinophils Relative: 2 %
HCT: 30.8 % — ABNORMAL LOW (ref 36.0–46.0)
Hemoglobin: 9.8 g/dL — ABNORMAL LOW (ref 12.0–15.0)
Immature Granulocytes: 1 %
Lymphocytes Relative: 14 %
Lymphs Abs: 0.7 10*3/uL (ref 0.7–4.0)
MCH: 27 pg (ref 26.0–34.0)
MCHC: 31.8 g/dL (ref 30.0–36.0)
MCV: 84.8 fL (ref 80.0–100.0)
Monocytes Absolute: 0.5 10*3/uL (ref 0.1–1.0)
Monocytes Relative: 9 %
Neutro Abs: 3.8 10*3/uL (ref 1.7–7.7)
Neutrophils Relative %: 73 %
Platelet Count: 213 10*3/uL (ref 150–400)
RBC: 3.63 MIL/uL — ABNORMAL LOW (ref 3.87–5.11)
RDW: 19.2 % — ABNORMAL HIGH (ref 11.5–15.5)
WBC Count: 5.2 10*3/uL (ref 4.0–10.5)
nRBC: 0 % (ref 0.0–0.2)

## 2021-06-08 MED ORDER — SODIUM CHLORIDE 0.9% FLUSH
10.0000 mL | INTRAVENOUS | Status: DC | PRN
  Administered 2021-06-08: 10 mL

## 2021-06-08 MED ORDER — METHYLPREDNISOLONE SODIUM SUCC 125 MG IJ SOLR: 125.0000 mg | Freq: Once | INTRAMUSCULAR | Status: DC | PRN

## 2021-06-08 MED ORDER — DIPHENHYDRAMINE HCL 50 MG/ML IJ SOLN: 50.0000 mg | Freq: Once | INTRAMUSCULAR | Status: DC | PRN

## 2021-06-08 MED ORDER — HEPARIN SOD (PORK) LOCK FLUSH 100 UNIT/ML IV SOLN: 250.0000 [IU] | Freq: Once | INTRAVENOUS | Status: DC | PRN

## 2021-06-08 MED ORDER — FAMOTIDINE IN NACL 20-0.9 MG/50ML-% IV SOLN: 20.0000 mg | Freq: Once | INTRAVENOUS | Status: DC | PRN

## 2021-06-08 MED ORDER — DENOSUMAB 120 MG/1.7ML ~~LOC~~ SOLN
120.0000 mg | Freq: Once | SUBCUTANEOUS | Status: AC
  Administered 2021-06-08: 120 mg via SUBCUTANEOUS
  Filled 2021-06-08: qty 1.7

## 2021-06-08 MED ORDER — SODIUM CHLORIDE 0.9% FLUSH: 3.0000 mL | Freq: Once | INTRAVENOUS | Status: DC | PRN

## 2021-06-08 MED ORDER — SODIUM CHLORIDE 0.9 % IV SOLN: Freq: Once | INTRAVENOUS | Status: DC | PRN

## 2021-06-08 MED ORDER — SODIUM CHLORIDE 0.9 % IV SOLN
300.0000 mg/m2 | Freq: Once | INTRAVENOUS | Status: AC
  Administered 2021-06-08: 560 mg via INTRAVENOUS
  Filled 2021-06-08: qty 28

## 2021-06-08 MED ORDER — PALONOSETRON HCL INJECTION 0.25 MG/5ML
0.2500 mg | Freq: Once | INTRAVENOUS | Status: AC
  Administered 2021-06-08: 0.25 mg via INTRAVENOUS
  Filled 2021-06-08: qty 5

## 2021-06-08 MED ORDER — EPINEPHRINE 0.3 MG/0.3ML IJ SOAJ: 0.3000 mg | Freq: Once | INTRAMUSCULAR | Status: DC | PRN

## 2021-06-08 MED ORDER — ALTEPLASE 2 MG IJ SOLR: 2.0000 mg | Freq: Once | INTRAMUSCULAR | Status: DC | PRN

## 2021-06-08 MED ORDER — SODIUM CHLORIDE 0.9 % IV SOLN
20.0000 mg | Freq: Once | INTRAVENOUS | Status: AC
  Administered 2021-06-08: 20 mg via INTRAVENOUS
  Filled 2021-06-08: qty 20

## 2021-06-08 MED ORDER — SODIUM CHLORIDE 0.9 % IV SOLN: Freq: Once | INTRAVENOUS | Status: AC

## 2021-06-08 MED ORDER — HEPARIN SOD (PORK) LOCK FLUSH 100 UNIT/ML IV SOLN
500.0000 [IU] | Freq: Once | INTRAVENOUS | Status: AC | PRN
  Administered 2021-06-08: 500 [IU]

## 2021-06-08 MED ORDER — ALBUTEROL SULFATE (2.5 MG/3ML) 0.083% IN NEBU: 2.5000 mg | INHALATION_SOLUTION | Freq: Once | RESPIRATORY_TRACT | Status: DC | PRN

## 2021-06-08 NOTE — Patient Instructions (Addendum)
[facility]  Discharge Instructions: ?Thank you for choosing [facility] to provide your oncology and hematology care.  ? ?If you have a lab appointment with the [facility], please go directly to the [facility] and check in at the registration area. ? ?Wear comfortable clothing and clothing appropriate for easy access to any Portacath or PICC line.  ? ?We strive to give you quality time with your provider. You may need to reschedule your appointment if you arrive late (15 or more minutes).  Arriving late affects you and other patients whose appointments are after yours.  Also, if you miss three or more appointments without notifying the office, you may be dismissed from the clinic at the provider?s discretion.    ?  ?For prescription refill requests, have your pharmacy contact our office and allow 72 hours for refills to be completed.   ? ?Today you received the following chemotherapy and/or immunotherapy agents: Cytoxan ? ?To help prevent nausea and vomiting after your treatment, we encourage you to take your nausea medication as directed. ? ?BELOW ARE SYMPTOMS THAT SHOULD BE REPORTED IMMEDIATELY: ?*FEVER GREATER THAN 100.4 F (38 ?C) OR HIGHER ?*CHILLS OR SWEATING ?*NAUSEA AND VOMITING THAT IS NOT CONTROLLED WITH YOUR NAUSEA MEDICATION ?*UNUSUAL SHORTNESS OF BREATH ?*UNUSUAL BRUISING OR BLEEDING ?*URINARY PROBLEMS (pain or burning when urinating, or frequent urination) ?*BOWEL PROBLEMS (unusual diarrhea, constipation, pain near the anus) ?TENDERNESS IN MOUTH AND THROAT WITH OR WITHOUT PRESENCE OF ULCERS (sore throat, sores in mouth, or a toothache) ?UNUSUAL RASH, SWELLING OR PAIN  ?UNUSUAL VAGINAL DISCHARGE OR ITCHING  ? ?Items with * indicate a potential emergency and should be followed up as soon as possible or go to the Emergency Department if any problems should occur. ? ?Please show the CHEMOTHERAPY ALERT CARD or IMMUNOTHERAPY ALERT CARD at check-in to the  Emergency Department and triage nurse. ?Should you have questions after your visit or need to cancel or reschedule your appointment, please contact [facility]  [phone] and follow the prompts.  Office hours are 8:00 a.m. to 4:30 p.m. Monday - Friday. Please note that voicemails left after 4:00 p.m. may not be returned until the following business day.  We are closed weekends and major holidays. You have access to a nurse at all times for urgent questions. Please call the main number to the clinic [phone] and follow the prompts. ? ?For any non-urgent questions, you may also contact your provider using MyChart. We now offer e-Visits for anyone 55 and older to request care online for non-urgent symptoms. For details visit [email or web address]. ?  ?Also download the MyChart app! Go to the app store, search "MyChart", open the app, select [facility], and log in with your MyChart username and password. ? ?Due to Covid, a mask is required upon entering the hospital/clinic. If you do not have a mask, one will be given to you upon arrival. For doctor visits, patients may have 1 support person aged 17 or older with them. For treatment visits, patients cannot have anyone with them due to current Covid guidelines and our immunocompromised population.  ?

## 2021-06-08 NOTE — Patient Instructions (Signed)

## 2021-06-13 ENCOUNTER — Telehealth: Payer: Self-pay | Admitting: *Deleted

## 2021-06-13 ENCOUNTER — Other Ambulatory Visit: Payer: Self-pay | Admitting: *Deleted

## 2021-06-13 ENCOUNTER — Telehealth: Payer: Self-pay | Admitting: Pharmacist

## 2021-06-13 ENCOUNTER — Other Ambulatory Visit (HOSPITAL_COMMUNITY): Payer: Self-pay

## 2021-06-13 ENCOUNTER — Telehealth: Payer: Self-pay | Admitting: Pharmacy Technician

## 2021-06-13 DIAGNOSIS — Z79899 Other long term (current) drug therapy: Secondary | ICD-10-CM | POA: Diagnosis not present

## 2021-06-13 DIAGNOSIS — C9000 Multiple myeloma not having achieved remission: Secondary | ICD-10-CM | POA: Diagnosis not present

## 2021-06-13 DIAGNOSIS — R6889 Other general symptoms and signs: Secondary | ICD-10-CM | POA: Diagnosis not present

## 2021-06-13 MED ORDER — POMALIDOMIDE 2 MG PO CAPS: 2.0000 mg | ORAL_CAPSULE | Freq: Every day | ORAL | 0 refills | Status: DC

## 2021-06-13 NOTE — Telephone Encounter (Signed)
Call received from patient stating that she received a prescription for Xpovio in the mail and would like to know if she should start it.  Pt informed that she should be taking Pomalyst 2 mg daily and not Xpovio.  Pt states that she does not have Pomalyst. ? ?Call placed back to patient to inform her that [facility] # was sent on Pomalyst today, that she should receive it in the next couple of days.  Pt states that she rechecked the date on Xpovio and it is an old prescription from 2022 and was not delivered recently. Pt instructed to not take Xpovio and to take Pomalyst as directed once it arrives to her home. Pt is appreciative of call back and has no other questions at this time.  ?

## 2021-06-13 NOTE — Telephone Encounter (Signed)
Oral Oncology Patient Advocate Encounter ?  ?Received notification that prior authorization for Pomalyst is required. ?  ?PA submitted on 06/13/2021 ?Key [id number] ?Status is pending ?   ? ? ?Lady Deutscher, CPhT-Adv ?Pharmacy Patient Advocate Specialist ?[facility] Patient Advocate Team ?Direct Number: [phone]  Fax: [phone] ? ?

## 2021-06-13 NOTE — Telephone Encounter (Addendum)
Oral Oncology Pharmacist Encounter ? ?Received new prescription for Pomalyst (pomalidomide) for the treatment of multiple myeloma in conjunction with cyclophosphamide and dexamethasone, planned as bridge therapy until CAR-T can be done. ? ?CBC w/ Diff and CMP from 06/08/21 assessed, no relevant lab abnormalities noted. Prescription dose and frequency assessed for appropriateness.  ? ?Current medication list in Epic reviewed, DDIs with Pomalyst identified: ?Risk for increased fatigue/somnolence with Pomalyst used in combination with Hycodan, Ativan and Compazine. Noted all of these are PRN medications for patient. No baseline dose adjustments or changes in therapy warranted at this time.  ? ?Evaluated chart and no patient barriers to medication adherence noted.  ? ?Prescription will need to be e-scribed to [facility] due to patient's insurance plan requirements.  ? ?Oral Oncology Clinic will continue to follow for insurance authorization, copayment issues, initial counseling and start date. ? ?Leron Croak, PharmD, BCPS ?Hematology/Oncology Clinical Pharmacist ?[facility] Oral Chemotherapy Navigation Clinics ?[phone] ?06/13/2021 12:18 PM ? ?

## 2021-06-14 ENCOUNTER — Telehealth: Payer: Self-pay

## 2021-06-14 ENCOUNTER — Other Ambulatory Visit (HOSPITAL_COMMUNITY): Payer: Self-pay

## 2021-06-14 NOTE — Telephone Encounter (Signed)
Oral Oncology Patient Advocate Encounter ? ?Was successful in securing patient a $12000 grant from [facility] to provide copayment coverage for Pomalyst.  This will keep the out of pocket expense at $0.   ?  ?[employer] ID: [id number] ? ?I have spoken with the patient. ?  ?The billing information is as follows and has been shared with [facility].  ?  ?RxBin: [id number] ?PCN: [id number] ?Member ID: [id number] ?Group ID: [id number] ?Dates of Eligibility: 05/15/21 through 05/15/22 ? ?Fund:  Multiple Myeloma ? ?Wynn Maudlin CPHT ?Specialty Pharmacy Patient Advocate ?[facility] ?Phone [phone] ?Fax [phone] ?06/14/2021 10:56 AM ?  ? ? ?

## 2021-06-14 NOTE — Telephone Encounter (Signed)
Oral Oncology Patient Advocate Encounter ? ?Prior Authorization for Pomalyst has been approved.   ? ?PA# [id number] ?Effective dates: 06/13/2021 through 06/14/2022 ? ?Patient must fill medication at [facility]. ? ? ? ?Lady Deutscher, CPhT-Adv ?Pharmacy Patient Advocate Specialist ?[facility] Patient Advocate Team ?Direct Number: [phone]  Fax: [phone] ? ?

## 2021-06-15 ENCOUNTER — Encounter: Payer: Self-pay | Admitting: Hematology & Oncology

## 2021-06-15 ENCOUNTER — Inpatient Hospital Stay: Payer: Medicare Other

## 2021-06-15 ENCOUNTER — Inpatient Hospital Stay (HOSPITAL_BASED_OUTPATIENT_CLINIC_OR_DEPARTMENT_OTHER): Payer: Medicare Other | Admitting: Hematology & Oncology

## 2021-06-15 ENCOUNTER — Ambulatory Visit (HOSPITAL_BASED_OUTPATIENT_CLINIC_OR_DEPARTMENT_OTHER)
Admission: RE | Admit: 2021-06-15 | Discharge: 2021-06-15 | Disposition: A | Discharge status: Home / Self Care | Payer: Medicare Other | Source: Ambulatory Visit | Attending: Hematology & Oncology | Admitting: Hematology & Oncology

## 2021-06-15 ENCOUNTER — Other Ambulatory Visit: Payer: Self-pay

## 2021-06-15 VITALS — BP 130/81 | HR 84 | Temp 98.9°F | Resp 18 | Ht 63.0 in | Wt 177.0 lb

## 2021-06-15 DIAGNOSIS — Z79899 Other long term (current) drug therapy: Secondary | ICD-10-CM | POA: Diagnosis not present

## 2021-06-15 DIAGNOSIS — M25511 Pain in right shoulder: Secondary | ICD-10-CM | POA: Diagnosis not present

## 2021-06-15 DIAGNOSIS — C9000 Multiple myeloma not having achieved remission: Secondary | ICD-10-CM

## 2021-06-15 DIAGNOSIS — Z5111 Encounter for antineoplastic chemotherapy: Secondary | ICD-10-CM | POA: Diagnosis not present

## 2021-06-15 LAB — CMP (CANCER CENTER ONLY)
ALT: 20 U/L (ref 0–44)
AST: 14 U/L — ABNORMAL LOW (ref 15–41)
Albumin: 4.2 g/dL (ref 3.5–5.0)
Alkaline Phosphatase: 50 U/L (ref 38–126)
Anion gap: 9 (ref 5–15)
BUN: 17 mg/dL (ref 8–23)
CO2: 26 mmol/L (ref 22–32)
Calcium: 9.1 mg/dL (ref 8.9–10.3)
Chloride: 108 mmol/L (ref 98–111)
Creatinine: 0.8 mg/dL (ref 0.44–1.00)
GFR, Estimated: >60 mL/min (ref >60)
Glucose, Bld: 97 mg/dL (ref 70–99)
Potassium: 3.1 mmol/L — ABNORMAL LOW (ref 3.5–5.1)
Sodium: 143 mmol/L (ref 135–145)
Total Bilirubin: 0.4 mg/dL (ref 0.3–1.2)
Total Protein: 6.6 g/dL (ref 6.5–8.1)

## 2021-06-15 LAB — CBC WITH DIFFERENTIAL (CANCER CENTER ONLY)
Abs Immature Granulocytes: 0.03 10*3/uL (ref 0.00–0.07)
Basophils Absolute: 0.1 10*3/uL (ref 0.0–0.1)
Basophils Relative: 2 %
Eosinophils Absolute: 0.1 10*3/uL (ref 0.0–0.5)
Eosinophils Relative: 2 %
HCT: 27.9 % — ABNORMAL LOW (ref 36.0–46.0)
Hemoglobin: 8.9 g/dL — ABNORMAL LOW (ref 12.0–15.0)
Immature Granulocytes: 1 %
Lymphocytes Relative: 19 %
Lymphs Abs: 0.6 10*3/uL — ABNORMAL LOW (ref 0.7–4.0)
MCH: 27.5 pg (ref 26.0–34.0)
MCHC: 31.9 g/dL (ref 30.0–36.0)
MCV: 86.1 fL (ref 80.0–100.0)
Monocytes Absolute: 0.5 10*3/uL (ref 0.1–1.0)
Monocytes Relative: 13 %
Neutro Abs: 2.2 10*3/uL (ref 1.7–7.7)
Neutrophils Relative %: 63 %
Platelet Count: 183 10*3/uL (ref 150–400)
RBC: 3.24 MIL/uL — ABNORMAL LOW (ref 3.87–5.11)
RDW: 19.3 % — ABNORMAL HIGH (ref 11.5–15.5)
Smear Review: NORMAL
WBC Count: 3.4 10*3/uL — ABNORMAL LOW (ref 4.0–10.5)
nRBC: 0 % (ref 0.0–0.2)

## 2021-06-15 LAB — LACTATE DEHYDROGENASE: LDH: 177 U/L (ref 98–192)

## 2021-06-15 MED ORDER — PALONOSETRON HCL INJECTION 0.25 MG/5ML
0.2500 mg | Freq: Once | INTRAVENOUS | Status: AC
  Administered 2021-06-15: 0.25 mg via INTRAVENOUS
  Filled 2021-06-15: qty 5

## 2021-06-15 MED ORDER — SODIUM CHLORIDE 0.9 % IV SOLN
20.0000 mg | Freq: Once | INTRAVENOUS | Status: AC
  Administered 2021-06-15: 20 mg via INTRAVENOUS
  Filled 2021-06-15: qty 20

## 2021-06-15 MED ORDER — SODIUM CHLORIDE 0.9 % IV SOLN: Freq: Once | INTRAVENOUS | Status: AC

## 2021-06-15 MED ORDER — SODIUM CHLORIDE 0.9% FLUSH
10.0000 mL | INTRAVENOUS | Status: DC | PRN
  Administered 2021-06-15: 10 mL

## 2021-06-15 MED ORDER — HEPARIN SOD (PORK) LOCK FLUSH 100 UNIT/ML IV SOLN
500.0000 [IU] | Freq: Once | INTRAVENOUS | Status: AC | PRN
  Administered 2021-06-15: 500 [IU]

## 2021-06-15 MED ORDER — SODIUM CHLORIDE 0.9 % IV SOLN
300.0000 mg/m2 | Freq: Once | INTRAVENOUS | Status: AC
  Administered 2021-06-15: 560 mg via INTRAVENOUS
  Filled 2021-06-15: qty 28

## 2021-06-15 NOTE — Progress Notes (Signed)
?Hematology and Oncology Follow Up Visit ? ?Emily Greer ?[id number] ?[birth year] 71 y.o. ?06/15/2021 ? ? ?Principle Diagnosis:  ?Recurrent lambda light chain myeloma - nl cytogenetics ?History of recurrent endometrial carcinoma ?Iron deficiency anemia -blood loss ?  ?Past Therapy:             ?Status post second autologous stem cell transplant on 07/24/2014 ?Maintenance therapy with Pomalidomide/every 2 week Velcade - d/c'ed ?Radiation therapy for endometrial recurrence - completed 04/20/2015 ?Pomalyst/Kyprolis 70mg /m2 IV q 2 weeks - s/p cycle #12 - held on 12/26/2017 for hematuria ?Daratumumab/Pomalyst (1 mg) - status post cycle 19 -- d/c on 08/21/2019 ?Melflufen 40 mg IV q 4 weeks -- started on 08/27/2019, s/p cycle #2 --  D/c due to FDA removal ?Selinexor 60 mg po q week -- start on 01/19/2020 -- changed on 03/18/2020 -- d/c on 04/27/2020 ?  ?Current Therapy:        ?Blenrep 2.5 mg/m2 IV q 3 weeks -- started on 05/20/2020, s/p cycle #7 --D/C secondary to progression on 12/22/2020 ?Carfilzomib/Sarclisa -- s/p cycle #3 -- start on 01/03/2021 ?Femara 2.5 mg po q day  ?IV iron-Feraheme given on 05/03/2021. ?Cytoxan/Pomalyst/decadron -- start cycle #1 on 06/15/2021 ?  ?Interim History:  Emily Greer is here today for follow-up.  She is in the process of having the CAR-T.  This will be done down at [facility]. ? ?Until she has the actual infusion of CAR-T, we will do a "bridge" therapy with Cytoxan/pomalidomide/Decadron.[name] Kitchen  She will start this today.  Have a sounds like she will see Dr. Dorothea Ogle down at [place] in about a month. ? ?She is feeling okay.  She still has the visual issues.  She still has a cataracts.  She really needs to have cataract surgery.  She goes back to see the ophthalmologist I think in a couple weeks or so. ? ?She has had no problems with nausea or vomiting.  She did have a nice Mother's Day weekend. ? ?She has had no issues with fever.  She has had no pain although there is some discomfort in  the right shoulder.  We will get some x-rays of the right shoulder and see how that looks.   ? ?Her last lambda light chain in March was 12.1mg /dL. ? ?Overall, I would have to say that her performance status is probably ECOG 1.   ? ?Medications:  ?Allergies as of 06/15/2021   ? ?   Reactions  ? Codeine Nausea Only  ? ?  ? ?  ?Medication List  ?  ? ?  ? Accurate as of Jun 15, 2021  1:42 PM. If you have any questions, ask your nurse or doctor.  ?  ?  ? ?  ? ?STOP taking these medications   ? ?cefdinir 300 MG capsule ?Commonly known as: OMNICEF ?Stopped by: Emily Macho, MD ?  ? ?  ? ?TAKE these medications   ? ?acyclovir ointment 5 % ?Commonly known as: Zovirax ?Apply thin layer to affected area two to three times daily prn ?  ?albuterol 108 (90 Base) MCG/ACT inhaler ?Commonly known as: VENTOLIN HFA ?Inhale 2 puffs into the lungs every 6 (six) hours as needed for wheezing or shortness of breath. 2 puffs 3 times daily x 5 days then every 6 hours as needed. ?  ?amLODipine 10 MG tablet ?Commonly known as: NORVASC ?TAKE 1 TABLET(10 MG) BY MOUTH EVERY MORNING ?  ?aspirin EC 81 MG tablet ?Take 81 mg by mouth daily. ?  ?azelastine  0.1 % nasal spray ?Commonly known as: ASTELIN ?Place 1 spray into both nostrils 2 (two) times daily. Use in each nostril as directed ?  ?CALCIUM 1200+D3 PO ?Take 1 tablet by mouth daily. ?  ?cetirizine 10 MG tablet ?Commonly known as: ZYRTEC ?Take 1 tablet (10 mg total) by mouth at bedtime. ?  ?chlorhexidine 0.12 % solution ?Commonly known as: PERIDEX ?10 mLs 2 (two) times daily. ?  ?Fluocinolone Acetonide Scalp 0.01 % Oil ?Apply topically. ?  ?hydrochlorothiazide 12.5 MG capsule ?Commonly known as: MICROZIDE ?Take 12.5 mg by mouth every morning. ?  ?HYDROcodone bit-homatropine 5-1.5 MG/5ML syrup ?Commonly known as: HYCODAN ?Take 5 mLs by mouth every 6 (six) hours as needed for cough. ?  ?letrozole 2.5 MG tablet ?Commonly known as: Femara ?Take 1 tablet (2.5 mg total) by mouth daily. ?   ?lidocaine 5 % ?Commonly known as: LIDODERM ?Place 1 patch onto the skin daily. Remove & Discard patch within 12 hours or as directed by MD ?  ?lidocaine-prilocaine cream ?Commonly known as: EMLA ?Apply 1 application topically as needed. ?What changed: Another medication with the same name was removed. Continue taking this medication, and follow the directions you see here. ?Changed by: Emily Macho, MD ?  ?LORazepam 0.5 MG tablet ?Commonly known as: Ativan ?Take 1 tablet (0.5 mg total) by mouth every 6 (six) hours as needed (Nausea or vomiting). ?  ?metoprolol succinate 50 MG 24 hr tablet ?Commonly known as: TOPROL-XL ?TAKE 1 TABLET(50 MG) BY MOUTH EVERY MORNING ?  ?montelukast 10 MG tablet ?Commonly known as: SINGULAIR ?TAKE 1 TABLET(10 MG) BY MOUTH AT BEDTIME ?  ?ondansetron 8 MG tablet ?Commonly known as: Zofran ?Take 1 tablet (8 mg total) by mouth 2 (two) times daily as needed (Nausea or vomiting). ?  ?ondansetron 8 MG tablet ?Commonly known as: Zofran ?Take 1 tablet (8 mg total) by mouth 2 (two) times daily as needed for refractory nausea / vomiting. Start on day 3 after chemotherapy. ?  ?pomalidomide 2 MG capsule ?Commonly known as: POMALYST ?Take 1 capsule (2 mg total) by mouth daily. Activity Siri Cole # [id number]  Date Obtained-06/13/2021 ?  ?potassium chloride SA 20 MEQ tablet ?Commonly known as: KLOR-CON M ?TAKE 1 TABLET(20 MEQ) BY MOUTH TWICE DAILY ?  ?PROBIOTIC DAILY PO ?Take by mouth daily. ?  ?prochlorperazine 10 MG tablet ?Commonly known as: COMPAZINE ?Take 1 tablet (10 mg total) by mouth every 6 (six) hours as needed (Nausea or vomiting). ?  ?triamcinolone 55 MCG/ACT Aero nasal inhaler ?Commonly known as: NASACORT ?Place 2 sprays into the nose as needed. ?  ?triamterene-hydrochlorothiazide 37.5-25 MG tablet ?Commonly known as: MAXZIDE-25 ?1 tablet by mouth at bedtime ?  ?Vitamin D3 50 MCG (2000 UT) Tabs ?Take 2 tablets by mouth daily. ?  ?Xiidra 5 % Soln ?Generic drug: Lifitegrast ?  ?zinc  gluconate 50 MG tablet ?Take 50 mg by mouth daily. ?  ? ?  ? ? ?Allergies:  ?Allergies  ?Allergen Reactions  ? Codeine Nausea Only  ? ? ?Past Medical History, Surgical history, Social history, and Family History were reviewed and updated. ? ?Review of Systems: ?Review of Systems  ?Constitutional: Negative.   ?HENT: Negative.    ?Eyes:  Positive for blurred vision.  ?Respiratory: Negative.    ?Cardiovascular: Negative.   ?Gastrointestinal: Negative.   ?Genitourinary: Negative.   ?Musculoskeletal: Negative.   ?Skin: Negative.   ?Neurological: Negative.   ?Endo/Heme/Allergies: Negative.   ?Psychiatric/Behavioral: Negative.    ? ? ?Physical Exam: ? height  is 5\' 3"  (1.6 m) and weight is 177 lb (80.3 kg). Her oral temperature is 98.9 ?F (37.2 ?C). Her blood pressure is 130/81 and her pulse is 84. Her respiration is 18 and oxygen saturation is 100%.  ? ?Wt Readings from Last 3 Encounters:  ?06/15/21 177 lb (80.3 kg)  ?05/23/21 175 lb (79.4 kg)  ?05/05/21 177 lb 9.6 oz (80.6 kg)  ? ? ?Physical Exam ?Vitals reviewed.  ?HENT:  ?   Head: Normocephalic and atraumatic.  ?Eyes:  ?   Pupils: Pupils are equal, round, and reactive to light.  ?Cardiovascular:  ?   Rate and Rhythm: Normal rate and regular rhythm.  ?   Heart sounds: Normal heart sounds.  ?Pulmonary:  ?   Effort: Pulmonary effort is normal.  ?   Breath sounds: Normal breath sounds.  ?Abdominal:  ?   General: Bowel sounds are normal.  ?   Palpations: Abdomen is soft.  ?Musculoskeletal:     ?   General: No tenderness or deformity. Normal range of motion.  ?   Cervical back: Normal range of motion.  ?Lymphadenopathy:  ?   Cervical: No cervical adenopathy.  ?Skin: ?   General: Skin is warm and dry.  ?   Findings: No erythema or rash.  ?Neurological:  ?   Mental Status: She is alert and oriented to person, place, and time.  ?Psychiatric:     ?   Behavior: Behavior normal.     ?   Thought Content: Thought content normal.     ?   Judgment: Judgment normal.  ? ? ? ?Lab Results   ?Component Value Date  ? WBC 3.4 (L) 06/15/2021  ? HGB 8.9 (L) 06/15/2021  ? HCT 27.9 (L) 06/15/2021  ? MCV 86.1 06/15/2021  ? PLT 183 06/15/2021  ? ?Lab Results  ?Component Value Date  ? FERRITIN 2,494 (H) 04/04

## 2021-06-15 NOTE — Patient Instructions (Signed)

## 2021-06-15 NOTE — Patient Instructions (Signed)
[facility]  Discharge Instructions: ?Thank you for choosing [facility] to provide your oncology and hematology care.  ? ?If you have a lab appointment with the [facility], please go directly to the [facility] and check in at the registration area. ? ?Wear comfortable clothing and clothing appropriate for easy access to any Portacath or PICC line.  ? ?We strive to give you quality time with your provider. You may need to reschedule your appointment if you arrive late (15 or more minutes).  Arriving late affects you and other patients whose appointments are after yours.  Also, if you miss three or more appointments without notifying the office, you may be dismissed from the clinic at the provider?s discretion.    ?  ?For prescription refill requests, have your pharmacy contact our office and allow 72 hours for refills to be completed.   ? ?Today you received the following chemotherapy and/or immunotherapy agents Cytoxan    ?  ?To help prevent nausea and vomiting after your treatment, we encourage you to take your nausea medication as directed. ? ?BELOW ARE SYMPTOMS THAT SHOULD BE REPORTED IMMEDIATELY: ?*FEVER GREATER THAN 100.4 F (38 ?C) OR HIGHER ?*CHILLS OR SWEATING ?*NAUSEA AND VOMITING THAT IS NOT CONTROLLED WITH YOUR NAUSEA MEDICATION ?*UNUSUAL SHORTNESS OF BREATH ?*UNUSUAL BRUISING OR BLEEDING ?*URINARY PROBLEMS (pain or burning when urinating, or frequent urination) ?*BOWEL PROBLEMS (unusual diarrhea, constipation, pain near the anus) ?TENDERNESS IN MOUTH AND THROAT WITH OR WITHOUT PRESENCE OF ULCERS (sore throat, sores in mouth, or a toothache) ?UNUSUAL RASH, SWELLING OR PAIN  ?UNUSUAL VAGINAL DISCHARGE OR ITCHING  ? ?Items with * indicate a potential emergency and should be followed up as soon as possible or go to the Emergency Department if any problems should occur. ? ?Please show the CHEMOTHERAPY ALERT CARD or IMMUNOTHERAPY ALERT CARD at check-in to the  Emergency Department and triage nurse. ?Should you have questions after your visit or need to cancel or reschedule your appointment, please contact [facility]  [phone] and follow the prompts.  Office hours are 8:00 a.m. to 4:30 p.m. Monday - Friday. Please note that voicemails left after 4:00 p.m. may not be returned until the following business day.  We are closed weekends and major holidays. You have access to a nurse at all times for urgent questions. Please call the main number to the clinic [phone] and follow the prompts. ? ?For any non-urgent questions, you may also contact your provider using MyChart. We now offer e-Visits for anyone 99 and older to request care online for non-urgent symptoms. For details visit [email or web address]. ?  ?Also download the MyChart app! Go to the app store, search "MyChart", open the app, select [facility], and log in with your MyChart username and password. ? ?Due to Covid, a mask is required upon entering the hospital/clinic. If you do not have a mask, one will be given to you upon arrival. For doctor visits, patients may have 1 support person aged 35 or older with them. For treatment visits, patients cannot have anyone with them due to current Covid guidelines and our immunocompromised population.  ?

## 2021-06-16 LAB — KAPPA/LAMBDA LIGHT CHAINS
Kappa free light chain: 3.2 mg/L — ABNORMAL LOW (ref 3.3–19.4)
Kappa, lambda light chain ratio: 0.04 — ABNORMAL LOW (ref 0.26–1.65)
Lambda free light chains: 74.4 mg/L — ABNORMAL HIGH (ref 5.7–26.3)

## 2021-06-16 LAB — IGG, IGA, IGM
IgA: 11 mg/dL — ABNORMAL LOW (ref 87–352)
IgG (Immunoglobin G), Serum: 340 mg/dL — ABNORMAL LOW (ref 586–1602)
IgM (Immunoglobulin M), Srm: <5 mg/dL — ABNORMAL LOW (ref 26–217)

## 2021-06-16 NOTE — Telephone Encounter (Signed)
Oral Chemotherapy Pharmacist Encounter  I spoke with patient for overview of: Pomalyst (pomalidomide) for the treatment of multiple myeloma in conjunction with cyclophosphamide and dexamethasone, planned as bridge therapy until CAR-T can be done.  Reviewed with patient on administration, dosing, side effects, monitoring, drug-food interactions, safe handling, storage, and disposal.  Patient will take Pomalyst 2mg capsules, 1 capsule by mouth once daily, without regard to food, with a full glass of water.  Pomalyst will be given 21 days on, 7 days off, repeat every 28 days.  Patient will receive dexamethasone in clinic prior to cyclophosphamide infusion.   Pomalyst start date: 06/17/21  Adverse effects of Pomalyst include but are not limited to: nausea, constipation, diarrhea, abdominal pain, rash, fatigue, peripheral edema, and decreased blood counts.    Reviewed with patient importance of keeping a medication schedule and plan for any missed doses. No barriers to medication adherence identified.  Medication reconciliation performed and medication/allergy list updated.   Patient counseled on importance of daily aspirin 81mg for VTE prophylaxis.  Insurance authorization for Pomalyst has been obtained.  [facility]omaly[facility] as it is a limited distribution medication. This will [facility]'s local [facility] for pickup on 06/17/21.  All questions answered.  Ms. Mancinelli voiced understanding and appreciation.   Medication education handout placed in mail for patient. Patient knows to call the office with questions or concerns. Oral Chemotherapy Clinic phone number provided to patient.   Jumanah Hynson, PharmD, BCPS Hematology/Oncology Clinical Pharmacist [facility]otherapy Navigation [facility]ics [phone] 06/16/2021 10:30 AM  

## 2021-06-17 ENCOUNTER — Encounter: Payer: Self-pay | Admitting: *Deleted

## 2021-06-23 ENCOUNTER — Inpatient Hospital Stay: Payer: Medicare Other

## 2021-06-23 VITALS — BP 150/71 | HR 80 | Temp 98.4°F | Resp 16

## 2021-06-23 DIAGNOSIS — C9000 Multiple myeloma not having achieved remission: Secondary | ICD-10-CM

## 2021-06-23 DIAGNOSIS — C9001 Multiple myeloma in remission: Secondary | ICD-10-CM

## 2021-06-23 DIAGNOSIS — C541 Malignant neoplasm of endometrium: Secondary | ICD-10-CM

## 2021-06-23 DIAGNOSIS — Z5111 Encounter for antineoplastic chemotherapy: Secondary | ICD-10-CM | POA: Diagnosis not present

## 2021-06-23 DIAGNOSIS — Z79899 Other long term (current) drug therapy: Secondary | ICD-10-CM | POA: Diagnosis not present

## 2021-06-23 LAB — CMP (CANCER CENTER ONLY)
ALT: 14 U/L (ref 0–44)
AST: 12 U/L — ABNORMAL LOW (ref 15–41)
Albumin: 4 g/dL (ref 3.5–5.0)
Alkaline Phosphatase: 46 U/L (ref 38–126)
Anion gap: 8 (ref 5–15)
BUN: 17 mg/dL (ref 8–23)
CO2: 27 mmol/L (ref 22–32)
Calcium: 9.4 mg/dL (ref 8.9–10.3)
Chloride: 105 mmol/L (ref 98–111)
Creatinine: 0.98 mg/dL (ref 0.44–1.00)
GFR, Estimated: >60 mL/min (ref >60)
Glucose, Bld: 94 mg/dL (ref 70–99)
Potassium: 3.8 mmol/L (ref 3.5–5.1)
Sodium: 140 mmol/L (ref 135–145)
Total Bilirubin: 0.3 mg/dL (ref 0.3–1.2)
Total Protein: 6.1 g/dL — ABNORMAL LOW (ref 6.5–8.1)

## 2021-06-23 LAB — CBC WITH DIFFERENTIAL (CANCER CENTER ONLY)
Abs Immature Granulocytes: 0.04 10*3/uL (ref 0.00–0.07)
Basophils Absolute: 0 10*3/uL (ref 0.0–0.1)
Basophils Relative: 1 %
Eosinophils Absolute: 0.1 10*3/uL (ref 0.0–0.5)
Eosinophils Relative: 3 %
HCT: 27 % — ABNORMAL LOW (ref 36.0–46.0)
Hemoglobin: 8.6 g/dL — ABNORMAL LOW (ref 12.0–15.0)
Immature Granulocytes: 2 %
Lymphocytes Relative: 13 %
Lymphs Abs: 0.3 10*3/uL — ABNORMAL LOW (ref 0.7–4.0)
MCH: 27.2 pg (ref 26.0–34.0)
MCHC: 31.9 g/dL (ref 30.0–36.0)
MCV: 85.4 fL (ref 80.0–100.0)
Monocytes Absolute: 0.3 10*3/uL (ref 0.1–1.0)
Monocytes Relative: 13 %
Neutro Abs: 1.6 10*3/uL — ABNORMAL LOW (ref 1.7–7.7)
Neutrophils Relative %: 68 %
Platelet Count: 158 10*3/uL (ref 150–400)
RBC: 3.16 MIL/uL — ABNORMAL LOW (ref 3.87–5.11)
RDW: 19 % — ABNORMAL HIGH (ref 11.5–15.5)
Smear Review: NORMAL
WBC Count: 2.4 10*3/uL — ABNORMAL LOW (ref 4.0–10.5)
nRBC: 0 % (ref 0.0–0.2)

## 2021-06-23 MED ORDER — SODIUM CHLORIDE 0.9% FLUSH
10.0000 mL | INTRAVENOUS | Status: DC | PRN
  Administered 2021-06-23: 10 mL

## 2021-06-23 MED ORDER — SODIUM CHLORIDE 0.9 % IV SOLN
20.0000 mg | Freq: Once | INTRAVENOUS | Status: AC
  Administered 2021-06-23: 20 mg via INTRAVENOUS
  Filled 2021-06-23: qty 20

## 2021-06-23 MED ORDER — SODIUM CHLORIDE 0.9 % IV SOLN
300.0000 mg/m2 | Freq: Once | INTRAVENOUS | Status: AC
  Administered 2021-06-23: 560 mg via INTRAVENOUS
  Filled 2021-06-23: qty 28

## 2021-06-23 MED ORDER — SODIUM CHLORIDE 0.9 % IV SOLN: Freq: Once | INTRAVENOUS | Status: AC

## 2021-06-23 MED ORDER — PALONOSETRON HCL INJECTION 0.25 MG/5ML
0.2500 mg | Freq: Once | INTRAVENOUS | Status: AC
  Administered 2021-06-23: 0.25 mg via INTRAVENOUS
  Filled 2021-06-23: qty 5

## 2021-06-23 MED ORDER — HEPARIN SOD (PORK) LOCK FLUSH 100 UNIT/ML IV SOLN
500.0000 [IU] | Freq: Once | INTRAVENOUS | Status: AC | PRN
  Administered 2021-06-23: 500 [IU]

## 2021-06-23 NOTE — Patient Instructions (Signed)
[facility]  Discharge Instructions: Thank you for choosing [facility] to provide your oncology and hematology care.   If you have a lab appointment with the [facility], please go directly to the [facility] and check in at the registration area.  Wear comfortable clothing and clothing appropriate for easy access to any Portacath or PICC line.   We strive to give you quality time with your provider. You may need to reschedule your appointment if you arrive late (15 or more minutes).  Arriving late affects you and other patients whose appointments are after yours.  Also, if you miss three or more appointments without notifying the office, you may be dismissed from the clinic at the provider's discretion.      For prescription refill requests, have your pharmacy contact our office and allow 72 hours for refills to be completed.    Today you received the following chemotherapy and/or immunotherapy agents cytoxan     To help prevent nausea and vomiting after your treatment, we encourage you to take your nausea medication as directed.  BELOW ARE SYMPTOMS THAT SHOULD BE REPORTED IMMEDIATELY: *FEVER GREATER THAN 100.4 F (38 C) OR HIGHER *CHILLS OR SWEATING *NAUSEA AND VOMITING THAT IS NOT CONTROLLED WITH YOUR NAUSEA MEDICATION *UNUSUAL SHORTNESS OF BREATH *UNUSUAL BRUISING OR BLEEDING *URINARY PROBLEMS (pain or burning when urinating, or frequent urination) *BOWEL PROBLEMS (unusual diarrhea, constipation, pain near the anus) TENDERNESS IN MOUTH AND THROAT WITH OR WITHOUT PRESENCE OF ULCERS (sore throat, sores in mouth, or a toothache) UNUSUAL RASH, SWELLING OR PAIN  UNUSUAL VAGINAL DISCHARGE OR ITCHING   Items with * indicate a potential emergency and should be followed up as soon as possible or go to the Emergency Department if any problems should occur.  Please show the CHEMOTHERAPY ALERT CARD or IMMUNOTHERAPY ALERT CARD at check-in to the  Emergency Department and triage nurse. Should you have questions after your visit or need to cancel or reschedule your appointment, please contact [facility]  [phone] and follow the prompts.  Office hours are 8:00 a.m. to 4:30 p.m. Monday - Friday. Please note that voicemails left after 4:00 p.m. may not be returned until the following business day.  We are closed weekends and major holidays. You have access to a nurse at all times for urgent questions. Please call the main number to the clinic [phone] and follow the prompts.  For any non-urgent questions, you may also contact your provider using MyChart. We now offer e-Visits for anyone 68 and older to request care online for non-urgent symptoms. For details visit [email or web address].   Also download the MyChart app! Go to the app store, search "MyChart", open the app, select [facility], and log in with your MyChart username and password.  Due to Covid, a mask is required upon entering the hospital/clinic. If you do not have a mask, one will be given to you upon arrival. For doctor visits, patients may have 1 support person aged 31 or older with them. For treatment visits, patients cannot have anyone with them due to current Covid guidelines and our immunocompromised population.

## 2021-06-23 NOTE — Patient Instructions (Signed)

## 2021-06-29 ENCOUNTER — Encounter: Payer: Self-pay | Admitting: Hematology & Oncology

## 2021-06-29 ENCOUNTER — Other Ambulatory Visit: Payer: Self-pay | Admitting: Hematology & Oncology

## 2021-06-30 ENCOUNTER — Inpatient Hospital Stay: Payer: Medicare Other

## 2021-06-30 ENCOUNTER — Inpatient Hospital Stay: Payer: Medicare Other | Attending: Hematology & Oncology

## 2021-06-30 DIAGNOSIS — C9000 Multiple myeloma not having achieved remission: Secondary | ICD-10-CM | POA: Insufficient documentation

## 2021-06-30 DIAGNOSIS — Z923 Personal history of irradiation: Secondary | ICD-10-CM | POA: Insufficient documentation

## 2021-06-30 DIAGNOSIS — H538 Other visual disturbances: Secondary | ICD-10-CM | POA: Insufficient documentation

## 2021-06-30 DIAGNOSIS — D509 Iron deficiency anemia, unspecified: Secondary | ICD-10-CM | POA: Insufficient documentation

## 2021-06-30 DIAGNOSIS — Z79899 Other long term (current) drug therapy: Secondary | ICD-10-CM | POA: Insufficient documentation

## 2021-06-30 DIAGNOSIS — Z8542 Personal history of malignant neoplasm of other parts of uterus: Secondary | ICD-10-CM | POA: Insufficient documentation

## 2021-06-30 DIAGNOSIS — Z9484 Stem cells transplant status: Secondary | ICD-10-CM | POA: Insufficient documentation

## 2021-06-30 DIAGNOSIS — Z5111 Encounter for antineoplastic chemotherapy: Secondary | ICD-10-CM | POA: Diagnosis present

## 2021-06-30 LAB — CBC WITH DIFFERENTIAL (CANCER CENTER ONLY)
Abs Immature Granulocytes: 0.04 10*3/uL (ref 0.00–0.07)
Basophils Absolute: 0.1 10*3/uL (ref 0.0–0.1)
Basophils Relative: 2 %
Eosinophils Absolute: 0.1 10*3/uL (ref 0.0–0.5)
Eosinophils Relative: 4 %
HCT: 27.6 % — ABNORMAL LOW (ref 36.0–46.0)
Hemoglobin: 8.9 g/dL — ABNORMAL LOW (ref 12.0–15.0)
Immature Granulocytes: 1 %
Lymphocytes Relative: 16 %
Lymphs Abs: 0.4 10*3/uL — ABNORMAL LOW (ref 0.7–4.0)
MCH: 27.8 pg (ref 26.0–34.0)
MCHC: 32.2 g/dL (ref 30.0–36.0)
MCV: 86.3 fL (ref 80.0–100.0)
Monocytes Absolute: 0.9 10*3/uL (ref 0.1–1.0)
Monocytes Relative: 33 %
Neutro Abs: 1.3 10*3/uL — ABNORMAL LOW (ref 1.7–7.7)
Neutrophils Relative %: 44 %
Platelet Count: 133 10*3/uL — ABNORMAL LOW (ref 150–400)
RBC: 3.2 MIL/uL — ABNORMAL LOW (ref 3.87–5.11)
RDW: 19.2 % — ABNORMAL HIGH (ref 11.5–15.5)
WBC Count: 2.8 10*3/uL — ABNORMAL LOW (ref 4.0–10.5)
nRBC: 0 % (ref 0.0–0.2)

## 2021-06-30 LAB — CMP (CANCER CENTER ONLY)
ALT: 12 U/L (ref 0–44)
AST: 10 U/L — ABNORMAL LOW (ref 15–41)
Albumin: 4.1 g/dL (ref 3.5–5.0)
Alkaline Phosphatase: 44 U/L (ref 38–126)
Anion gap: 9 (ref 5–15)
BUN: 14 mg/dL (ref 8–23)
CO2: 25 mmol/L (ref 22–32)
Calcium: 9.3 mg/dL (ref 8.9–10.3)
Chloride: 105 mmol/L (ref 98–111)
Creatinine: 0.85 mg/dL (ref 0.44–1.00)
GFR, Estimated: >60 mL/min (ref >60)
Glucose, Bld: 97 mg/dL (ref 70–99)
Potassium: 3.7 mmol/L (ref 3.5–5.1)
Sodium: 139 mmol/L (ref 135–145)
Total Bilirubin: 0.3 mg/dL (ref 0.3–1.2)
Total Protein: 6.5 g/dL (ref 6.5–8.1)

## 2021-06-30 MED ORDER — PALONOSETRON HCL INJECTION 0.25 MG/5ML
0.2500 mg | Freq: Once | INTRAVENOUS | Status: AC
  Administered 2021-06-30: 0.25 mg via INTRAVENOUS
  Filled 2021-06-30: qty 5

## 2021-06-30 MED ORDER — SODIUM CHLORIDE 0.9 % IV SOLN
20.0000 mg | Freq: Once | INTRAVENOUS | Status: AC
  Administered 2021-06-30: 20 mg via INTRAVENOUS
  Filled 2021-06-30: qty 20

## 2021-06-30 MED ORDER — HEPARIN SOD (PORK) LOCK FLUSH 100 UNIT/ML IV SOLN
500.0000 [IU] | Freq: Once | INTRAVENOUS | Status: AC | PRN
  Administered 2021-06-30: 500 [IU]

## 2021-06-30 MED ORDER — SODIUM CHLORIDE 0.9% FLUSH
10.0000 mL | INTRAVENOUS | Status: DC | PRN
  Administered 2021-06-30: 10 mL

## 2021-06-30 MED ORDER — SODIUM CHLORIDE 0.9 % IV SOLN: Freq: Once | INTRAVENOUS | Status: AC

## 2021-06-30 MED ORDER — SODIUM CHLORIDE 0.9 % IV SOLN
300.0000 mg/m2 | Freq: Once | INTRAVENOUS | Status: AC
  Administered 2021-06-30: 560 mg via INTRAVENOUS
  Filled 2021-06-30: qty 28

## 2021-06-30 NOTE — Progress Notes (Signed)
Ok to treat today with ANC 1.3 per Dr Marin Olp

## 2021-06-30 NOTE — Patient Instructions (Signed)
[facility]  Discharge Instructions: Thank you for choosing [facility] to provide your oncology and hematology care.   If you have a lab appointment with the [facility], please go directly to the [facility] and check in at the registration area.  Wear comfortable clothing and clothing appropriate for easy access to any Portacath or PICC line.   We strive to give you quality time with your provider. You may need to reschedule your appointment if you arrive late (15 or more minutes).  Arriving late affects you and other patients whose appointments are after yours.  Also, if you miss three or more appointments without notifying the office, you may be dismissed from the clinic at the provider's discretion.      For prescription refill requests, have your pharmacy contact our office and allow 72 hours for refills to be completed.    Today you received the following chemotherapy and/or immunotherapy agents Cytoxan       To help prevent nausea and vomiting after your treatment, we encourage you to take your nausea medication as directed.  BELOW ARE SYMPTOMS THAT SHOULD BE REPORTED IMMEDIATELY: *FEVER GREATER THAN 100.4 F (38 C) OR HIGHER *CHILLS OR SWEATING *NAUSEA AND VOMITING THAT IS NOT CONTROLLED WITH YOUR NAUSEA MEDICATION *UNUSUAL SHORTNESS OF BREATH *UNUSUAL BRUISING OR BLEEDING *URINARY PROBLEMS (pain or burning when urinating, or frequent urination) *BOWEL PROBLEMS (unusual diarrhea, constipation, pain near the anus) TENDERNESS IN MOUTH AND THROAT WITH OR WITHOUT PRESENCE OF ULCERS (sore throat, sores in mouth, or a toothache) UNUSUAL RASH, SWELLING OR PAIN  UNUSUAL VAGINAL DISCHARGE OR ITCHING   Items with * indicate a potential emergency and should be followed up as soon as possible or go to the Emergency Department if any problems should occur.  Please show the CHEMOTHERAPY ALERT CARD or IMMUNOTHERAPY ALERT CARD at check-in to the  Emergency Department and triage nurse. Should you have questions after your visit or need to cancel or reschedule your appointment, please contact [facility]  [phone] and follow the prompts.  Office hours are 8:00 a.m. to 4:30 p.m. Monday - Friday. Please note that voicemails left after 4:00 p.m. may not be returned until the following business day.  We are closed weekends and major holidays. You have access to a nurse at all times for urgent questions. Please call the main number to the clinic [phone] and follow the prompts.  For any non-urgent questions, you may also contact your provider using MyChart. We now offer e-Visits for anyone 69 and older to request care online for non-urgent symptoms. For details visit [email or web address].   Also download the MyChart app! Go to the app store, search "MyChart", open the app, select [facility], and log in with your MyChart username and password.  Due to Covid, a mask is required upon entering the hospital/clinic. If you do not have a mask, one will be given to you upon arrival. For doctor visits, patients may have 1 support person aged 69 or older with them. For treatment visits, patients cannot have anyone with them due to current Covid guidelines and our immunocompromised population.

## 2021-06-30 NOTE — Patient Instructions (Signed)

## 2021-07-04 DIAGNOSIS — H25813 Combined forms of age-related cataract, bilateral: Secondary | ICD-10-CM | POA: Diagnosis not present

## 2021-07-04 DIAGNOSIS — Z79899 Other long term (current) drug therapy: Secondary | ICD-10-CM | POA: Diagnosis not present

## 2021-07-04 DIAGNOSIS — H40033 Anatomical narrow angle, bilateral: Secondary | ICD-10-CM | POA: Diagnosis not present

## 2021-07-06 ENCOUNTER — Inpatient Hospital Stay: Payer: Medicare Other

## 2021-07-06 DIAGNOSIS — C9000 Multiple myeloma not having achieved remission: Secondary | ICD-10-CM

## 2021-07-06 DIAGNOSIS — Z923 Personal history of irradiation: Secondary | ICD-10-CM | POA: Diagnosis not present

## 2021-07-06 DIAGNOSIS — D509 Iron deficiency anemia, unspecified: Secondary | ICD-10-CM | POA: Diagnosis not present

## 2021-07-06 DIAGNOSIS — Z95828 Presence of other vascular implants and grafts: Secondary | ICD-10-CM

## 2021-07-06 DIAGNOSIS — H538 Other visual disturbances: Secondary | ICD-10-CM | POA: Diagnosis not present

## 2021-07-06 DIAGNOSIS — Z79899 Other long term (current) drug therapy: Secondary | ICD-10-CM | POA: Diagnosis not present

## 2021-07-06 DIAGNOSIS — Z8542 Personal history of malignant neoplasm of other parts of uterus: Secondary | ICD-10-CM | POA: Diagnosis not present

## 2021-07-06 LAB — CMP (CANCER CENTER ONLY)
ALT: 13 U/L (ref 0–44)
AST: 9 U/L — ABNORMAL LOW (ref 15–41)
Albumin: 4 g/dL (ref 3.5–5.0)
Alkaline Phosphatase: 47 U/L (ref 38–126)
Anion gap: 9 (ref 5–15)
BUN: 16 mg/dL (ref 8–23)
CO2: 26 mmol/L (ref 22–32)
Calcium: 9 mg/dL (ref 8.9–10.3)
Chloride: 108 mmol/L (ref 98–111)
Creatinine: 0.8 mg/dL (ref 0.44–1.00)
GFR, Estimated: >60 mL/min (ref >60)
Glucose, Bld: 86 mg/dL (ref 70–99)
Potassium: 3.4 mmol/L — ABNORMAL LOW (ref 3.5–5.1)
Sodium: 143 mmol/L (ref 135–145)
Total Bilirubin: 0.5 mg/dL (ref 0.3–1.2)
Total Protein: 6.2 g/dL — ABNORMAL LOW (ref 6.5–8.1)

## 2021-07-06 LAB — CBC WITH DIFFERENTIAL (CANCER CENTER ONLY)
Abs Immature Granulocytes: 0.01 10*3/uL (ref 0.00–0.07)
Basophils Absolute: 0 10*3/uL (ref 0.0–0.1)
Basophils Relative: 1 %
Eosinophils Absolute: 0.1 10*3/uL (ref 0.0–0.5)
Eosinophils Relative: 5 %
HCT: 25.9 % — ABNORMAL LOW (ref 36.0–46.0)
Hemoglobin: 8.3 g/dL — ABNORMAL LOW (ref 12.0–15.0)
Immature Granulocytes: 1 %
Lymphocytes Relative: 20 %
Lymphs Abs: 0.3 10*3/uL — ABNORMAL LOW (ref 0.7–4.0)
MCH: 27.7 pg (ref 26.0–34.0)
MCHC: 32 g/dL (ref 30.0–36.0)
MCV: 86.3 fL (ref 80.0–100.0)
Monocytes Absolute: 0.4 10*3/uL (ref 0.1–1.0)
Monocytes Relative: 24 %
Neutro Abs: 0.8 10*3/uL — ABNORMAL LOW (ref 1.7–7.7)
Neutrophils Relative %: 49 %
Platelet Count: 128 10*3/uL — ABNORMAL LOW (ref 150–400)
RBC: 3 MIL/uL — ABNORMAL LOW (ref 3.87–5.11)
RDW: 19.9 % — ABNORMAL HIGH (ref 11.5–15.5)
WBC Count: 1.5 10*3/uL — ABNORMAL LOW (ref 4.0–10.5)
nRBC: 0 % (ref 0.0–0.2)

## 2021-07-06 MED ORDER — SODIUM CHLORIDE 0.9% FLUSH
10.0000 mL | INTRAVENOUS | Status: DC | PRN
  Administered 2021-07-06: 10 mL via INTRAVENOUS

## 2021-07-06 MED ORDER — HEPARIN SOD (PORK) LOCK FLUSH 100 UNIT/ML IV SOLN
500.0000 [IU] | Freq: Once | INTRAVENOUS | Status: AC
  Administered 2021-07-06: 500 [IU] via INTRAVENOUS

## 2021-07-06 NOTE — Progress Notes (Signed)
Due to her blood test results patient is not getting treated today per Emily Dawson, NP.

## 2021-07-06 NOTE — Addendum Note (Signed)
Addended by: Melton Krebs on: 07/06/2021 03:07 PM   Modules accepted: Orders

## 2021-07-07 ENCOUNTER — Other Ambulatory Visit: Payer: Self-pay | Admitting: Hematology & Oncology

## 2021-07-08 ENCOUNTER — Other Ambulatory Visit: Payer: Self-pay | Admitting: Hematology & Oncology

## 2021-07-14 ENCOUNTER — Inpatient Hospital Stay (HOSPITAL_BASED_OUTPATIENT_CLINIC_OR_DEPARTMENT_OTHER): Payer: Medicare Other | Admitting: Hematology & Oncology

## 2021-07-14 ENCOUNTER — Inpatient Hospital Stay: Payer: Medicare Other

## 2021-07-14 ENCOUNTER — Encounter: Payer: Self-pay | Admitting: Hematology & Oncology

## 2021-07-14 ENCOUNTER — Telehealth: Payer: Self-pay | Admitting: *Deleted

## 2021-07-14 ENCOUNTER — Other Ambulatory Visit: Payer: Self-pay | Admitting: *Deleted

## 2021-07-14 ENCOUNTER — Other Ambulatory Visit: Payer: Self-pay | Admitting: Oncology

## 2021-07-14 ENCOUNTER — Other Ambulatory Visit: Payer: Self-pay

## 2021-07-14 VITALS — BP 134/69 | HR 82 | Temp 98.4°F | Resp 18 | Ht 63.0 in | Wt 180.8 lb

## 2021-07-14 DIAGNOSIS — Z95828 Presence of other vascular implants and grafts: Secondary | ICD-10-CM

## 2021-07-14 DIAGNOSIS — C9000 Multiple myeloma not having achieved remission: Secondary | ICD-10-CM

## 2021-07-14 DIAGNOSIS — C9001 Multiple myeloma in remission: Secondary | ICD-10-CM

## 2021-07-14 DIAGNOSIS — C541 Malignant neoplasm of endometrium: Secondary | ICD-10-CM

## 2021-07-14 DIAGNOSIS — D509 Iron deficiency anemia, unspecified: Secondary | ICD-10-CM | POA: Diagnosis not present

## 2021-07-14 DIAGNOSIS — C9002 Multiple myeloma in relapse: Secondary | ICD-10-CM | POA: Diagnosis not present

## 2021-07-14 DIAGNOSIS — Z923 Personal history of irradiation: Secondary | ICD-10-CM | POA: Diagnosis not present

## 2021-07-14 DIAGNOSIS — Z79899 Other long term (current) drug therapy: Secondary | ICD-10-CM | POA: Diagnosis not present

## 2021-07-14 DIAGNOSIS — Z8542 Personal history of malignant neoplasm of other parts of uterus: Secondary | ICD-10-CM | POA: Diagnosis not present

## 2021-07-14 DIAGNOSIS — H538 Other visual disturbances: Secondary | ICD-10-CM | POA: Diagnosis not present

## 2021-07-14 LAB — LACTATE DEHYDROGENASE: LDH: 175 U/L (ref 98–192)

## 2021-07-14 LAB — CMP (CANCER CENTER ONLY)
ALT: 14 U/L (ref 0–44)
AST: 12 U/L — ABNORMAL LOW (ref 15–41)
Albumin: 3.4 g/dL — ABNORMAL LOW (ref 3.5–5.0)
Alkaline Phosphatase: 45 U/L (ref 38–126)
Anion gap: 8 (ref 5–15)
BUN: 11 mg/dL (ref 8–23)
CO2: 26 mmol/L (ref 22–32)
Calcium: 8.6 mg/dL — ABNORMAL LOW (ref 8.9–10.3)
Chloride: 107 mmol/L (ref 98–111)
Creatinine: 0.77 mg/dL (ref 0.44–1.00)
GFR, Estimated: >60 mL/min (ref >60)
Glucose, Bld: 103 mg/dL — ABNORMAL HIGH (ref 70–99)
Potassium: 2.9 mmol/L — ABNORMAL LOW (ref 3.5–5.1)
Sodium: 141 mmol/L (ref 135–145)
Total Bilirubin: 0.5 mg/dL (ref 0.3–1.2)
Total Protein: 6.8 g/dL (ref 6.5–8.1)

## 2021-07-14 LAB — IRON AND IRON BINDING CAPACITY (CC-WL,HP ONLY)
Iron: 24 ug/dL — ABNORMAL LOW (ref 28–170)
Saturation Ratios: 8 % — ABNORMAL LOW (ref 10.4–31.8)
TIBC: 316 ug/dL (ref 250–450)
UIBC: 292 ug/dL (ref 148–442)

## 2021-07-14 LAB — CBC WITH DIFFERENTIAL (CANCER CENTER ONLY)
Abs Immature Granulocytes: 0.02 10*3/uL (ref 0.00–0.07)
Basophils Absolute: 0.1 10*3/uL (ref 0.0–0.1)
Basophils Relative: 4 %
Eosinophils Absolute: 0.1 10*3/uL (ref 0.0–0.5)
Eosinophils Relative: 2 %
HCT: 25.8 % — ABNORMAL LOW (ref 36.0–46.0)
Hemoglobin: 8.2 g/dL — ABNORMAL LOW (ref 12.0–15.0)
Immature Granulocytes: 1 %
Lymphocytes Relative: 10 %
Lymphs Abs: 0.4 10*3/uL — ABNORMAL LOW (ref 0.7–4.0)
MCH: 27.5 pg (ref 26.0–34.0)
MCHC: 31.8 g/dL (ref 30.0–36.0)
MCV: 86.6 fL (ref 80.0–100.0)
Monocytes Absolute: 1.1 10*3/uL — ABNORMAL HIGH (ref 0.1–1.0)
Monocytes Relative: 29 %
Neutro Abs: 2 10*3/uL (ref 1.7–7.7)
Neutrophils Relative %: 54 %
Platelet Count: 205 10*3/uL (ref 150–400)
RBC: 2.98 MIL/uL — ABNORMAL LOW (ref 3.87–5.11)
RDW: 19.9 % — ABNORMAL HIGH (ref 11.5–15.5)
Smear Review: NORMAL
WBC Count: 3.7 10*3/uL — ABNORMAL LOW (ref 4.0–10.5)
nRBC: 0 % (ref 0.0–0.2)

## 2021-07-14 LAB — FERRITIN: Ferritin: 1312 ng/mL — ABNORMAL HIGH (ref 11–307)

## 2021-07-14 LAB — PREPARE RBC (CROSSMATCH)

## 2021-07-14 LAB — SAMPLE TO BLOOD BANK

## 2021-07-14 MED ORDER — HEPARIN SOD (PORK) LOCK FLUSH 100 UNIT/ML IV SOLN
500.0000 [IU] | Freq: Once | INTRAVENOUS | Status: AC | PRN
  Administered 2021-07-14: 500 [IU]

## 2021-07-14 MED ORDER — SODIUM CHLORIDE 0.9% FLUSH
10.0000 mL | INTRAVENOUS | Status: DC | PRN
  Administered 2021-07-14: 10 mL

## 2021-07-14 NOTE — Addendum Note (Signed)
Addended by: Perlie Gold on: 07/14/2021 09:36 AM   Modules accepted: Orders

## 2021-07-14 NOTE — Progress Notes (Signed)
Patient anticipating blood transfusion later today but more likely tomorrow due to difficulty in matching blood due to previous therapies. Port redressed with antimicrobial disc and sorbaview dressing and flushed per standards.

## 2021-07-14 NOTE — Patient Instructions (Signed)

## 2021-07-14 NOTE — Progress Notes (Signed)
Hematology and Oncology Follow Up Visit  Emily Greer [id number] 03/18/1950 71 y.o. 07/14/2021   Principle Diagnosis:  Recurrent lambda light chain myeloma - nl cytogenetics History of recurrent endometrial carcinoma Iron deficiency anemia -blood loss   Past Therapy:             Status post second autologous stem cell transplant on 07/24/2014 Maintenance therapy with Pomalidomide/every 2 week Velcade - d/c'ed Radiation therapy for endometrial recurrence - completed 04/20/2015 Pomalyst/Kyprolis 70mg /m2 IV q 2 weeks - s/p cycle #12 - held on 12/26/2017 for hematuria Daratumumab/Pomalyst (1 mg) - status post cycle 19 -- d/c on 08/21/2019 Melflufen 40 mg IV q 4 weeks -- started on 08/27/2019, s/p cycle #2 --  D/c due to FDA removal Selinexor 60 mg po q week -- start on 01/19/2020 -- changed on 03/18/2020 -- d/c on 04/27/2020   Current Therapy:        Blenrep 2.5 mg/m2 IV q 3 weeks -- started on 05/20/2020, s/p cycle #7 --D/C secondary to progression on 12/22/2020 Carfilzomib/Sarclisa -- s/p cycle #3 -- start on 01/03/2021 Femara 2.5 mg po q day  IV iron-Feraheme given on 05/03/2021. Cytoxan/Pomalyst/decadron -- start cycle #1 on 06/15/2021   Interim History:  [name] is here today for follow-up.  Finally, looks like she will get the CAR-T therapy.  She is tentatively set up to have the [facility] on 07/25/2021.  I think she goes in for "priming" treatment on 621.  She will get fludarabine and Cytoxan.  She apparently will be down there for quite a while since she does live quite a ways away and they need to have her close by.  She feels good.  She is quite anemic.  I think going to have to transfuse her.  Her iron is on the low side with a ferritin of 8.  I will have to also check to see what her erythropoietin level is.  She has had no problems with infections.  She has had cataract issues.  She needs to have cataract surgery.  I suspect this probably will not get done till  after the she has the CAR-T therapy.  She has had no fever.  She has had no leg swelling.  She has had no change in bowel or bladder habits.  Overall, I would have said that her performance status is probably ECOG 1.     Medications:  Allergies as of 07/14/2021       Reactions   Codeine Nausea Only        Medication List        Accurate as of July 14, 2021  9:06 AM. If you have any questions, ask your nurse or doctor.          STOP taking these medications    lidocaine 5 % Commonly known as: LIDODERM Stopped by: Josph Macho, MD   lidocaine-prilocaine cream Commonly known as: EMLA Stopped by: Josph Macho, MD       TAKE these medications    acyclovir ointment 5 % Commonly known as: Zovirax Apply thin layer to affected area two to three times daily prn   albuterol 108 (90 Base) MCG/ACT inhaler Commonly known as: VENTOLIN HFA Inhale 2 puffs into the lungs every 6 (six) hours as needed for wheezing or shortness of breath. 2 puffs 3 times daily x 5 days then every 6 hours as needed.   amLODipine 10 MG tablet Commonly known as: NORVASC TAKE 1 TABLET(10 MG) BY MOUTH  EVERY MORNING   aspirin EC 81 MG tablet Take 81 mg by mouth daily.   azelastine 0.1 % nasal spray Commonly known as: ASTELIN Place 1 spray into both nostrils 2 (two) times daily. Use in each nostril as directed   CALCIUM 1200+D3 PO Take 1 tablet by mouth daily.   cetirizine 10 MG tablet Commonly known as: ZYRTEC Take 1 tablet (10 mg total) by mouth at bedtime.   chlorhexidine 0.12 % solution Commonly known as: PERIDEX 10 mLs 2 (two) times daily.   Fluocinolone Acetonide Scalp 0.01 % Oil Apply topically.   hydrochlorothiazide 12.5 MG capsule Commonly known as: MICROZIDE Take 12.5 mg by mouth every morning.   HYDROcodone bit-homatropine 5-1.5 MG/5ML syrup Commonly known as: HYCODAN Take 5 mLs by mouth every 6 (six) hours as needed for cough.   ibuprofen 800 MG tablet Commonly  known as: ADVIL Take 800 mg by mouth 3 (three) times daily.   letrozole 2.5 MG tablet Commonly known as: Femara Take 1 tablet (2.5 mg total) by mouth daily.   LORazepam 0.5 MG tablet Commonly known as: Ativan Take 1 tablet (0.5 mg total) by mouth every 6 (six) hours as needed (Nausea or vomiting).   metoprolol succinate 50 MG 24 hr tablet Commonly known as: TOPROL-XL TAKE 1 TABLET(50 MG) BY MOUTH EVERY MORNING   montelukast 10 MG tablet Commonly known as: SINGULAIR TAKE 1 TABLET(10 MG) BY MOUTH AT BEDTIME   ondansetron 8 MG tablet Commonly known as: Zofran Take 1 tablet (8 mg total) by mouth 2 (two) times daily as needed (Nausea or vomiting).   ondansetron 8 MG tablet Commonly known as: Zofran Take 1 tablet (8 mg total) by mouth 2 (two) times daily as needed for refractory nausea / vomiting. Start on day 3 after chemotherapy.   penicillin v potassium 500 MG tablet Commonly known as: VEETID Take 500 mg by mouth 4 (four) times daily.   Pomalyst 2 MG capsule Generic drug: pomalidomide TAKE 1 CAPSULE BY MOUTH ONCE DAILY   potassium chloride SA 20 MEQ tablet Commonly known as: KLOR-CON M TAKE 1 TABLET(20 MEQ) BY MOUTH TWICE DAILY   PROBIOTIC DAILY PO Take by mouth daily.   prochlorperazine 10 MG tablet Commonly known as: COMPAZINE Take 1 tablet (10 mg total) by mouth every 6 (six) hours as needed (Nausea or vomiting).   triamcinolone 55 MCG/ACT Aero nasal inhaler Commonly known as: NASACORT Place 2 sprays into the nose as needed.   triamterene-hydrochlorothiazide 37.5-25 MG tablet Commonly known as: MAXZIDE-25 1 tablet by mouth at bedtime   Vitamin D3 50 MCG (2000 UT) Tabs Take 2 tablets by mouth daily.   Xiidra 5 % Soln Generic drug: Lifitegrast   zinc gluconate 50 MG tablet Take 50 mg by mouth daily.        Allergies:  Allergies  Allergen Reactions   Codeine Nausea Only    Past Medical History, Surgical history, Social history, and Family History  were reviewed and updated.  Review of Systems: Review of Systems  Constitutional: Negative.   HENT: Negative.    Eyes:  Positive for blurred vision.  Respiratory: Negative.    Cardiovascular: Negative.   Gastrointestinal: Negative.   Genitourinary: Negative.   Musculoskeletal: Negative.   Skin: Negative.   Neurological: Negative.   Endo/Heme/Allergies: Negative.   Psychiatric/Behavioral: Negative.       Physical Exam:  height is 5\' 3"  (1.6 m) and weight is 180 lb 12.8 oz (82 kg). Her oral temperature is 98.4 F (36.9  C). Her blood pressure is 134/69 and her pulse is 82. Her respiration is 18 and oxygen saturation is 99%.   Wt Readings from Last 3 Encounters:  07/14/21 180 lb 12.8 oz (82 kg)  06/15/21 177 lb (80.3 kg)  05/23/21 175 lb (79.4 kg)    Physical Exam Vitals reviewed.  HENT:     Head: Normocephalic and atraumatic.  Eyes:     Pupils: Pupils are equal, round, and reactive to light.  Cardiovascular:     Rate and Rhythm: Normal rate and regular rhythm.     Heart sounds: Normal heart sounds.  Pulmonary:     Effort: Pulmonary effort is normal.     Breath sounds: Normal breath sounds.  Abdominal:     General: Bowel sounds are normal.     Palpations: Abdomen is soft.  Musculoskeletal:        General: No tenderness or deformity. Normal range of motion.     Cervical back: Normal range of motion.  Lymphadenopathy:     Cervical: No cervical adenopathy.  Skin:    General: Skin is warm and dry.     Findings: No erythema or rash.  Neurological:     Mental Status: She is alert and oriented to person, place, and time.  Psychiatric:        Behavior: Behavior normal.        Thought Content: Thought content normal.        Judgment: Judgment normal.      Lab Results  Component Value Date   WBC 3.7 (L) 07/14/2021   HGB 8.2 (L) 07/14/2021   HCT 25.8 (L) 07/14/2021   MCV 86.6 07/14/2021   PLT 205 07/14/2021   Lab Results  Component Value Date   FERRITIN 2,494  (H) 05/03/2021   IRON 62 05/03/2021   TIBC 344 05/03/2021   UIBC 282 05/03/2021   IRONPCTSAT 18 05/03/2021   Lab Results  Component Value Date   RETICCTPCT 3.6 (H) 05/03/2021   RBC 2.98 (L) 07/14/2021   Lab Results  Component Value Date   KPAFRELGTCHN 3.2 (L) 06/15/2021   LAMBDASER 74.4 (H) 06/15/2021   KAPLAMBRATIO 0.04 (L) 06/15/2021   Lab Results  Component Value Date   IGGSERUM 340 (L) 06/15/2021   IGA 11 (L) 06/15/2021   IGMSERUM <5 (L) 06/15/2021   Lab Results  Component Value Date   TOTALPROTELP 6.3 04/12/2021   ALBUMINELP 3.6 04/12/2021   A1GS 0.3 04/12/2021   A2GS 1.0 04/12/2021   BETS 1.0 04/12/2021   BETA2SER 0.4 11/23/2014   GAMS 0.4 04/12/2021   MSPIKE 0.1 (H) 04/12/2021   SPEI Comment 10/19/2020     Chemistry      Component Value Date/Time   NA 143 07/06/2021 0958   NA 141 01/10/2017 1115   NA 140 06/21/2016 0918   K 3.4 (L) 07/06/2021 0958   K 4.0 01/10/2017 1115   K 4.3 06/21/2016 0918   CL 108 07/06/2021 0958   CL 106 01/10/2017 1115   CO2 26 07/06/2021 0958   CO2 27 01/10/2017 1115   CO2 20 (L) 06/21/2016 0918   BUN 16 07/06/2021 0958   BUN 15 01/10/2017 1115   BUN 15.8 06/21/2016 0918   CREATININE 0.80 07/06/2021 0958   CREATININE 1.0 01/10/2017 1115   CREATININE 0.8 06/21/2016 0918      Component Value Date/Time   CALCIUM 9.0 07/06/2021 0958   CALCIUM 9.5 01/10/2017 1115   CALCIUM 9.4 06/21/2016 0918   ALKPHOS 47 07/06/2021  0958   ALKPHOS 40 01/10/2017 1115   ALKPHOS 66 06/21/2016 0918   AST 9 (L) 07/06/2021 0958   AST 17 06/21/2016 0918   ALT 13 07/06/2021 0958   ALT 19 01/10/2017 1115   ALT 37 06/21/2016 0918   BILITOT 0.5 07/06/2021 0958   BILITOT 0.32 06/21/2016 0918       Impression and Plan: Ms. Surrette is a very pleasant 71 yo African American female with recurrent lambda light chain myeloma.  She had her second stem cell transplant for light chain myeloma was in June 2016.   She is now getting ready for the CAR-T  therapy.  She has had the "bridging" therapy.  She is doing well with this.  Her kappa light chains were last checked were down to 7.4 mg/dL.  Hopefully, she will not have toxicity from the CAR-T therapy.  I am sure that Dr. Dorothea Ogle will let us know when she is ready to come back.  We will make sure that she is on the denosumab.  I know that is taken a while to finally get her to CAR-T therapy.  Hopefully this will work for her.  Again we will transfuse her.  I will give her 2 units of blood.  I really think this will help her out.   Josph Macho, MD 6/15/20239:06 AM

## 2021-07-14 NOTE — Telephone Encounter (Signed)
Patient notified per order of Dr. Marin Olp to take 40 meq of PO potassium now and then 40 meq of PO potassium at bedtime today d/t a potassium of 2.9 from this AM.  Teach back done.  Pt states that she is currently taking Potassium 20 meq daily and does have enough pills to take extra doses today.

## 2021-07-15 ENCOUNTER — Inpatient Hospital Stay: Payer: Medicare Other

## 2021-07-15 VITALS — BP 152/65 | HR 65 | Temp 98.2°F | Resp 17

## 2021-07-15 DIAGNOSIS — C9002 Multiple myeloma in relapse: Secondary | ICD-10-CM

## 2021-07-15 DIAGNOSIS — C541 Malignant neoplasm of endometrium: Secondary | ICD-10-CM

## 2021-07-15 DIAGNOSIS — H538 Other visual disturbances: Secondary | ICD-10-CM | POA: Diagnosis not present

## 2021-07-15 DIAGNOSIS — C9001 Multiple myeloma in remission: Secondary | ICD-10-CM

## 2021-07-15 DIAGNOSIS — C9000 Multiple myeloma not having achieved remission: Secondary | ICD-10-CM

## 2021-07-15 DIAGNOSIS — Z8542 Personal history of malignant neoplasm of other parts of uterus: Secondary | ICD-10-CM | POA: Diagnosis not present

## 2021-07-15 DIAGNOSIS — Z79899 Other long term (current) drug therapy: Secondary | ICD-10-CM | POA: Diagnosis not present

## 2021-07-15 DIAGNOSIS — D509 Iron deficiency anemia, unspecified: Secondary | ICD-10-CM | POA: Diagnosis not present

## 2021-07-15 DIAGNOSIS — Z923 Personal history of irradiation: Secondary | ICD-10-CM | POA: Diagnosis not present

## 2021-07-15 LAB — KAPPA/LAMBDA LIGHT CHAINS
Kappa free light chain: 8.5 mg/L (ref 3.3–19.4)
Kappa, lambda light chain ratio: 0.3 (ref 0.26–1.65)
Lambda free light chains: 28.2 mg/L — ABNORMAL HIGH (ref 5.7–26.3)

## 2021-07-15 MED ORDER — SODIUM CHLORIDE 0.9 % IV SOLN
510.0000 mg | Freq: Once | INTRAVENOUS | Status: AC
  Administered 2021-07-15: 510 mg via INTRAVENOUS
  Filled 2021-07-15: qty 17

## 2021-07-15 MED ORDER — HEPARIN SOD (PORK) LOCK FLUSH 100 UNIT/ML IV SOLN
500.0000 [IU] | Freq: Once | INTRAVENOUS | Status: AC
  Administered 2021-07-15: 500 [IU] via INTRAVENOUS

## 2021-07-15 MED ORDER — SODIUM CHLORIDE 0.9% FLUSH
10.0000 mL | Freq: Once | INTRAVENOUS | Status: AC
  Administered 2021-07-15: 10 mL via INTRAVENOUS

## 2021-07-15 NOTE — Patient Instructions (Signed)

## 2021-07-16 LAB — BPAM RBC
Blood Product Expiration Date: 202306242359
Blood Product Expiration Date: 202307072359
ISSUE DATE / TIME: 202306160739
ISSUE DATE / TIME: 202306160739
Unit Type and Rh: 9500
Unit Type and Rh: 9500

## 2021-07-16 LAB — TYPE AND SCREEN
ABO/RH(D): A POS
Antibody Screen: POSITIVE
Unit division: 0
Unit division: 0

## 2021-07-16 LAB — IGG, IGA, IGM
IgA: 31 mg/dL — ABNORMAL LOW (ref 87–352)
IgG (Immunoglobin G), Serum: 411 mg/dL — ABNORMAL LOW (ref 586–1602)
IgM (Immunoglobulin M), Srm: <5 mg/dL — ABNORMAL LOW (ref 26–217)

## 2021-07-18 DIAGNOSIS — C9000 Multiple myeloma not having achieved remission: Secondary | ICD-10-CM | POA: Diagnosis not present

## 2021-07-18 DIAGNOSIS — J019 Acute sinusitis, unspecified: Secondary | ICD-10-CM | POA: Diagnosis not present

## 2021-07-18 DIAGNOSIS — C9001 Multiple myeloma in remission: Secondary | ICD-10-CM | POA: Diagnosis not present

## 2021-07-18 DIAGNOSIS — Z20822 Contact with and (suspected) exposure to covid-19: Secondary | ICD-10-CM | POA: Diagnosis not present

## 2021-07-18 DIAGNOSIS — R918 Other nonspecific abnormal finding of lung field: Secondary | ICD-10-CM | POA: Diagnosis not present

## 2021-07-18 DIAGNOSIS — Z79899 Other long term (current) drug therapy: Secondary | ICD-10-CM | POA: Diagnosis not present

## 2021-07-19 ENCOUNTER — Other Ambulatory Visit: Payer: Self-pay | Admitting: *Deleted

## 2021-07-19 DIAGNOSIS — C9000 Multiple myeloma not having achieved remission: Secondary | ICD-10-CM | POA: Diagnosis not present

## 2021-07-19 DIAGNOSIS — Z452 Encounter for adjustment and management of vascular access device: Secondary | ICD-10-CM | POA: Diagnosis not present

## 2021-07-19 DIAGNOSIS — D649 Anemia, unspecified: Secondary | ICD-10-CM | POA: Diagnosis not present

## 2021-07-19 MED ORDER — HYDROCODONE BIT-HOMATROP MBR 5-1.5 MG/5ML PO SOLN: 5.0000 mL | Freq: Four times a day (QID) | ORAL | 0 refills | Status: DC | PRN

## 2021-07-22 DIAGNOSIS — R6889 Other general symptoms and signs: Secondary | ICD-10-CM | POA: Diagnosis not present

## 2021-07-24 DIAGNOSIS — C541 Malignant neoplasm of endometrium: Secondary | ICD-10-CM | POA: Diagnosis not present

## 2021-07-24 DIAGNOSIS — T451X5A Adverse effect of antineoplastic and immunosuppressive drugs, initial encounter: Secondary | ICD-10-CM | POA: Diagnosis present

## 2021-07-24 DIAGNOSIS — Z8542 Personal history of malignant neoplasm of other parts of uterus: Secondary | ICD-10-CM | POA: Diagnosis not present

## 2021-07-24 DIAGNOSIS — Z20822 Contact with and (suspected) exposure to covid-19: Secondary | ICD-10-CM | POA: Diagnosis present

## 2021-07-24 DIAGNOSIS — D701 Agranulocytosis secondary to cancer chemotherapy: Secondary | ICD-10-CM | POA: Diagnosis present

## 2021-07-24 DIAGNOSIS — T8249XA Other complication of vascular dialysis catheter, initial encounter: Secondary | ICD-10-CM | POA: Diagnosis not present

## 2021-07-24 DIAGNOSIS — Z79899 Other long term (current) drug therapy: Secondary | ICD-10-CM | POA: Diagnosis not present

## 2021-07-24 DIAGNOSIS — Z9289 Personal history of other medical treatment: Secondary | ICD-10-CM | POA: Diagnosis not present

## 2021-07-24 DIAGNOSIS — Z452 Encounter for adjustment and management of vascular access device: Secondary | ICD-10-CM | POA: Diagnosis not present

## 2021-07-24 DIAGNOSIS — Z5111 Encounter for antineoplastic chemotherapy: Secondary | ICD-10-CM | POA: Diagnosis not present

## 2021-07-24 DIAGNOSIS — T82514A Breakdown (mechanical) of infusion catheter, initial encounter: Secondary | ICD-10-CM | POA: Diagnosis not present

## 2021-07-24 DIAGNOSIS — Z5112 Encounter for antineoplastic immunotherapy: Secondary | ICD-10-CM | POA: Diagnosis not present

## 2021-07-24 DIAGNOSIS — C9002 Multiple myeloma in relapse: Secondary | ICD-10-CM | POA: Diagnosis present

## 2021-07-24 DIAGNOSIS — J019 Acute sinusitis, unspecified: Secondary | ICD-10-CM | POA: Diagnosis not present

## 2021-07-24 DIAGNOSIS — K046 Periapical abscess with sinus: Secondary | ICD-10-CM | POA: Diagnosis present

## 2021-07-24 DIAGNOSIS — R195 Other fecal abnormalities: Secondary | ICD-10-CM | POA: Diagnosis not present

## 2021-07-24 DIAGNOSIS — R058 Other specified cough: Secondary | ICD-10-CM | POA: Diagnosis not present

## 2021-07-24 DIAGNOSIS — R0902 Hypoxemia: Secondary | ICD-10-CM | POA: Diagnosis present

## 2021-07-24 DIAGNOSIS — I10 Essential (primary) hypertension: Secondary | ICD-10-CM | POA: Diagnosis present

## 2021-07-24 DIAGNOSIS — R110 Nausea: Secondary | ICD-10-CM | POA: Diagnosis not present

## 2021-07-24 DIAGNOSIS — D759 Disease of blood and blood-forming organs, unspecified: Secondary | ICD-10-CM | POA: Diagnosis present

## 2021-07-24 DIAGNOSIS — C9000 Multiple myeloma not having achieved remission: Secondary | ICD-10-CM | POA: Diagnosis present

## 2021-07-26 ENCOUNTER — Ambulatory Visit: Payer: Medicare Other | Admitting: Internal Medicine

## 2021-07-28 ENCOUNTER — Ambulatory Visit (INDEPENDENT_AMBULATORY_CARE_PROVIDER_SITE_OTHER): Payer: Medicare Other

## 2021-07-28 ENCOUNTER — Telehealth: Payer: Medicare Other

## 2021-07-28 DIAGNOSIS — I10 Essential (primary) hypertension: Secondary | ICD-10-CM

## 2021-07-28 DIAGNOSIS — E6609 Other obesity due to excess calories: Secondary | ICD-10-CM

## 2021-07-28 DIAGNOSIS — C9000 Multiple myeloma not having achieved remission: Secondary | ICD-10-CM

## 2021-07-28 NOTE — Patient Instructions (Addendum)
Visit Information  Thank you for taking time to visit with me today. Please don't hesitate to contact me if I can be of assistance to you before our next scheduled telephone appointment.  Following are the goals we discussed today:  Take all medications as prescribed Attend all scheduled provider appointments Call pharmacy for medication refills 3-7 days in advance of running out of medications Perform IADL's (shopping, preparing meals, housekeeping, managing finances) independently Call provider office for new concerns or questions  keep a blood pressure log take blood pressure log to all doctor appointments call doctor for signs and symptoms of high blood pressure take medications for blood pressure exactly as prescribed report new symptoms to your doctor  Our next appointment is by telephone on 08/18/21 at 09:30 AM   Please call the care guide team at [phone] if you need to cancel or reschedule your appointment.   If you are experiencing a Mental Health or [place] or need someone to talk to, please call 1-800-273-TALK (toll free, 24 hour hotline)   Patient verbalizes understanding of instructions and care plan provided today and agrees to view in [place]. Active MyChart status and patient understanding of how to access instructions and care plan via MyChart confirmed with patient.     Barb Merino, RN, BSN, CCM Care Management Coordinator [place] [facility]  Direct Phone: [phone]

## 2021-07-28 NOTE — Chronic Care Management (AMB) (Signed)
Chronic Care Management   CCM RN Visit Note  07/28/2021 Name: Emily Greer MRN: [id number] DOB: September 18, 1950  Subjective: Emily Greer is a 71 y.o. year old female who is a primary care patient of [name], MD. The care management team was consulted for assistance with disease management and care coordination needs.    Engaged with patient by telephone for follow up visit in response to provider referral for case management and/or care coordination services.   Consent to Services:  The patient was given information about Chronic Care Management services, agreed to services, and gave verbal consent prior to initiation of services.  Please see initial visit note for detailed documentation.   Patient agreed to services and verbal consent obtained.   Assessment: Review of patient past medical history, allergies, medications, health status, including review of consultants reports, laboratory and other test data, was performed as part of comprehensive evaluation and provision of chronic care management services.   SDOH (Social Determinants of Health) assessments and interventions performed:    CCM Care Plan  Allergies  Allergen Reactions   Codeine Nausea Only    Outpatient Encounter Medications as of 07/28/2021  Medication Sig Note   acyclovir ointment (ZOVIRAX) 5 % Apply thin layer to affected area two to three times daily prn    albuterol (VENTOLIN HFA) 108 (90 Base) MCG/ACT inhaler Inhale 2 puffs into the lungs every 6 (six) hours as needed for wheezing or shortness of breath. 2 puffs 3 times daily x 5 days then every 6 hours as needed.    amLODipine (NORVASC) 10 MG tablet TAKE 1 TABLET(10 MG) BY MOUTH EVERY MORNING    aspirin EC 81 MG tablet Take 81 mg by mouth daily.    azelastine (ASTELIN) 0.1 % nasal spray Place 1 spray into both nostrils 2 (two) times daily. Use in each nostril as directed    Calcium-Magnesium-Vitamin D (CALCIUM 1200+D3 PO) Take 1 tablet by mouth  daily.    cetirizine (ZYRTEC) 10 MG tablet Take 1 tablet (10 mg total) by mouth at bedtime.    chlorhexidine (PERIDEX) 0.12 % solution 10 mLs 2 (two) times daily.    Cholecalciferol (VITAMIN D3) 2000 UNITS TABS Take 2 tablets by mouth daily. 05/20/2020: Patient stated she is taking this twice a day.   Fluocinolone Acetonide Scalp 0.01 % OIL Apply topically.    hydrochlorothiazide (MICROZIDE) 12.5 MG capsule Take 12.5 mg by mouth every morning.    HYDROcodone bit-homatropine (HYCODAN) 5-1.5 MG/5ML syrup Take 5 mLs by mouth every 6 (six) hours as needed for cough.    HYDROcodone bit-homatropine (HYCODAN) 5-1.5 MG/5ML syrup Take 5 mLs by mouth every 6 (six) hours as needed for cough.    ibuprofen (ADVIL) 800 MG tablet Take 800 mg by mouth 3 (three) times daily.    letrozole (FEMARA) 2.5 MG tablet Take 1 tablet (2.5 mg total) by mouth daily.    metoprolol succinate (TOPROL-XL) 50 MG 24 hr tablet TAKE 1 TABLET(50 MG) BY MOUTH EVERY MORNING    montelukast (SINGULAIR) 10 MG tablet TAKE 1 TABLET(10 MG) BY MOUTH AT BEDTIME    ondansetron (ZOFRAN) 8 MG tablet Take 1 tablet (8 mg total) by mouth 2 (two) times daily as needed (Nausea or vomiting).    penicillin v potassium (VEETID) 500 MG tablet Take 500 mg by mouth 4 (four) times daily.    pomalidomide (POMALYST) 2 MG capsule TAKE 1 CAPSULE BY MOUTH ONCE DAILY    potassium chloride SA (KLOR-CON) 20 MEQ tablet  TAKE 1 TABLET(20 MEQ) BY MOUTH TWICE DAILY    Probiotic Product (PROBIOTIC DAILY PO) Take by mouth daily.    triamcinolone (NASACORT) 55 MCG/ACT AERO nasal inhaler Place 2 sprays into the nose as needed.     triamterene-hydrochlorothiazide (MAXZIDE-25) 37.5-25 MG tablet 1 tablet by mouth at bedtime    XIIDRA 5 % SOLN     zinc gluconate 50 MG tablet Take 50 mg by mouth daily.    [DISCONTINUED] prochlorperazine (COMPAZINE) 10 MG tablet Take 1 tablet (10 mg total) by mouth every 6 (six) hours as needed (Nausea or vomiting).    Facility-Administered  Encounter Medications as of 07/28/2021  Medication   0.9 %  sodium chloride infusion   albuterol (PROVENTIL) (2.5 MG/3ML) 0.083% nebulizer solution 2.5 mg   sodium chloride flush (NS) 0.9 % injection 10 mL   sodium chloride flush (NS) 0.9 % injection 10 mL   sodium chloride flush (NS) 0.9 % injection 10 mL   sodium chloride flush (NS) 0.9 % injection 10 mL   sodium chloride flush (NS) 0.9 % injection 10 mL   sodium chloride flush (NS) 0.9 % injection 10 mL   sodium chloride flush (NS) 0.9 % injection 10 mL    Patient Active Problem List   Diagnosis Date Noted   Mastalgia in female 12/29/2020   Class 1 obesity due to excess calories with serious comorbidity and body mass index (BMI) of 30.0 to 30.9 in adult 12/29/2020   Goals of care, counseling/discussion 08/21/2019   Osteonecrosis, unspecified (HCC) 03/31/2019   VAIN I (vaginal intraepithelial neoplasia grade I) 10/10/2018   Influenza B 02/18/2018   Flu-like symptoms 02/18/2018   Excessive cerumen in both ear canals 02/18/2018   Abnormal Pap smear of vagina 12/30/2015   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) 01/29/2015   Iron deficiency anemia due to chronic blood loss    HCAP (healthcare-associated pneumonia) 12/29/2014   UTI (lower urinary tract infection) 12/29/2014   Hypokalemia 12/29/2014   CAP (community acquired pneumonia) 12/29/2014   Bone marrow transplant status (HCC) 07/25/2014   S/P autologous bone marrow transplantation (HCC) 07/25/2014   Fever 05/31/2014   ARF (acute renal failure) (HCC) 05/31/2014   Anemia 05/31/2014   Renal failure    Examination of participant in clinical trial 05/19/2014   Encounter for examination for normal comparison or control in clinical research program 05/19/2014   Kahler disease (HCC) 05/05/2014   Multiple myeloma in remission (HCC) 05/05/2014   History of radiation therapy    Endometrial carcinoma (HCC) 09/28/2011   Pregnancy induced hypertension     Elevated hemoglobin A1c    Myeloma (HCC) 01/17/2011   Lambda light chain myeloma (HCC) 11/11/2007   Vaginal atrophy 12/19/2006   Increased BMI 12/18/2005   Hypertension 11/28/1997   H/O multiple myeloma 11/28/1997   Staphylococcus aureus bacteremia 11/28/1997   Staphylococcus epidermidis bacteremia 11/28/1997   Essential (primary) hypertension 11/28/1997    Conditions to be addressed/monitored: Essential Hypertension, Class 1 Obesity, Multiple Myeloma  Care Plan : RN Care Manager Plan of Care  Updates made by Riley Churches, RN since 07/28/2021 12:00 AM     Problem: No Plan of Care established for management of chronic disease states (Essential Hypertension, Class 1 Obesity, Multiple Myeloma)   Priority: High     Long-Range Goal: Establishment of Plan of Care for management of chronic disease states (Essential Hypertension, Class 1 Obesity, Multiple Myeloma)   Start Date: 02/14/2021  Expected End Date: 02/14/2022  Recent Progress: On track  Priority: High  Note:   Current Barriers:  Knowledge Deficits related to plan of care for management of Essential Hypertension, Class 1 Obesity, Multiple Myeloma  Chronic Disease Management support and education needs related to Essential Hypertension, Class 1 Obesity, Multiple Myeloma   RNCM Clinical Goal(s):  Patient will verbalize basic understanding of  Essential Hypertension, Class 1 Obesity, Multiple Myeloma disease process and self health management plan as evidenced by patient will report having no disease exacerbations related to her chronic disease states as listed above  take all medications exactly as prescribed and will call provider for medication related questions as evidenced by patient will report having no missed doses of her prescribed medications  demonstrate Ongoing health management independence as evidenced by patient will report being 100% adherent to her prescribed treatment plan continue to work with RN Care Manager  to address care management and care coordination needs related to  Essential Hypertension, Class 1 Obesity, Multiple Myeloma as evidenced by adherence to CM Team Scheduled appointments demonstrate ongoing self health care management ability   as evidenced by    through collaboration with RN Care manager, provider, and care team.   Interventions: 1:1 collaboration with primary care provider regarding development and update of comprehensive plan of care as evidenced by provider attestation and co-signature Inter-disciplinary care team collaboration (see longitudinal plan of care) Evaluation of current treatment plan related to  self management and patient's adherence to plan as established by provider  Hypertension Interventions:  (Status:  Condition stable.  Not addressed this visit.) Long Term Goal Last practice recorded BP readings:  BP Readings from Last 3 Encounters:  05/23/21 124/67  05/05/21 (!) 183/81  05/03/21 137/74  Most recent eGFR/CrCl:  Lab Results  Component Value Date   EGFR 86 (L) 06/21/2016    No components found for: CRCL Evaluation of current treatment plan related to hypertension self management and patient's adherence to plan as established by provider Reviewed medications with patient and discussed importance of compliance Counseled on the importance of exercise goals with target of 150 minutes per week Advised patient, providing education and rationale, to monitor blood pressure daily and record, calling PCP for findings outside established parameters Reviewed scheduled/upcoming provider appointment including: next PCP office visit scheduled for 07/26/21 @10 :40 AM Provided education on prescribed diet low Sodium Discussed complications of poorly controlled blood pressure such as heart disease, stroke, circulatory complications, vision complications, kidney impairment, sexual dysfunction Mailed printed educational materials related to Chair Exercises; How to accurately  monitor BP at home   Oncology:  (Status: Goal on track:  Yes.) Long Term Goal Assessment of understanding of oncology diagnosis Assessed patient understanding of cancer diagnosis and recommended treatment plan Determined patient is current admitted to [facility] in [place], [place] for her cilta-cel infusions with an admit date of 07/24/21 Reviewed chart to note patient is anticipated to complete 10 cycles in total, with an end date of 08/03/21 Sent in basket message to PCP advising of patient's admission and advised patient missed her PCP follow up visit on 07/26/21 but will call to reschedule when able Discussed plans with patient for ongoing care management follow up and provided patient with direct contact information for care management team  Patient Goals/Self-Care Activities: Take all medications as prescribed Attend all scheduled provider appointments Call pharmacy for medication refills 3-7 days in advance of running out of medications Perform IADL's (shopping, preparing meals, housekeeping, managing finances) independently Call provider office for  new concerns or questions  keep a blood pressure log take blood pressure log to all doctor appointments call doctor for signs and symptoms of high blood pressure take medications for blood pressure exactly as prescribed report new symptoms to your doctor  Follow Up Plan:  Telephone follow up appointment with care management team member scheduled for:  08/18/21     Delsa Sale, RN, BSN, CCM Care Management Coordinator [facility]  Direct Phone: [phone]

## 2021-07-29 DIAGNOSIS — I10 Essential (primary) hypertension: Secondary | ICD-10-CM

## 2021-07-29 DIAGNOSIS — C541 Malignant neoplasm of endometrium: Secondary | ICD-10-CM

## 2021-08-04 DIAGNOSIS — Z9289 Personal history of other medical treatment: Secondary | ICD-10-CM | POA: Diagnosis not present

## 2021-08-04 DIAGNOSIS — C9000 Multiple myeloma not having achieved remission: Secondary | ICD-10-CM | POA: Diagnosis not present

## 2021-08-08 DIAGNOSIS — R7989 Other specified abnormal findings of blood chemistry: Secondary | ICD-10-CM | POA: Diagnosis not present

## 2021-08-08 DIAGNOSIS — Z9289 Personal history of other medical treatment: Secondary | ICD-10-CM | POA: Diagnosis not present

## 2021-08-08 DIAGNOSIS — C9000 Multiple myeloma not having achieved remission: Secondary | ICD-10-CM | POA: Diagnosis not present

## 2021-08-10 DIAGNOSIS — C9000 Multiple myeloma not having achieved remission: Secondary | ICD-10-CM | POA: Diagnosis not present

## 2021-08-10 DIAGNOSIS — Z9289 Personal history of other medical treatment: Secondary | ICD-10-CM | POA: Diagnosis not present

## 2021-08-10 DIAGNOSIS — Z9484 Stem cells transplant status: Secondary | ICD-10-CM | POA: Diagnosis not present

## 2021-08-12 DIAGNOSIS — C9000 Multiple myeloma not having achieved remission: Secondary | ICD-10-CM | POA: Diagnosis not present

## 2021-08-12 DIAGNOSIS — R799 Abnormal finding of blood chemistry, unspecified: Secondary | ICD-10-CM | POA: Diagnosis not present

## 2021-08-12 DIAGNOSIS — Z9289 Personal history of other medical treatment: Secondary | ICD-10-CM | POA: Diagnosis not present

## 2021-08-15 DIAGNOSIS — E440 Moderate protein-calorie malnutrition: Secondary | ICD-10-CM | POA: Diagnosis not present

## 2021-08-15 DIAGNOSIS — Z79899 Other long term (current) drug therapy: Secondary | ICD-10-CM | POA: Diagnosis not present

## 2021-08-15 DIAGNOSIS — C9000 Multiple myeloma not having achieved remission: Secondary | ICD-10-CM | POA: Diagnosis not present

## 2021-08-15 DIAGNOSIS — Z9289 Personal history of other medical treatment: Secondary | ICD-10-CM | POA: Diagnosis not present

## 2021-08-15 DIAGNOSIS — Z9484 Stem cells transplant status: Secondary | ICD-10-CM | POA: Diagnosis not present

## 2021-08-17 DIAGNOSIS — Z79899 Other long term (current) drug therapy: Secondary | ICD-10-CM | POA: Diagnosis not present

## 2021-08-17 DIAGNOSIS — Z9289 Personal history of other medical treatment: Secondary | ICD-10-CM | POA: Diagnosis not present

## 2021-08-17 DIAGNOSIS — C9000 Multiple myeloma not having achieved remission: Secondary | ICD-10-CM | POA: Diagnosis not present

## 2021-08-17 DIAGNOSIS — Z9484 Stem cells transplant status: Secondary | ICD-10-CM | POA: Diagnosis not present

## 2021-08-18 ENCOUNTER — Telehealth: Payer: Medicare Other

## 2021-08-18 ENCOUNTER — Telehealth: Payer: Self-pay

## 2021-08-18 NOTE — Telephone Encounter (Signed)
Care Management   Follow Up Note   08/18/2021 Name: Emily Greer MRN: [id number] DOB: 1950/09/18   Referred by: [name], MD Reason for referral : Care Coordination   An unsuccessful telephone outreach was attempted today. The patient was referred to the case management team for assistance with care management and care coordination.   Follow Up Plan: Telephone follow up appointment with care management team member scheduled for: 09/13/21  Delsa Sale, RN, BSN, CCM Care Management Coordinator [facility]  Direct Phone: [phone]

## 2021-08-19 DIAGNOSIS — Z9289 Personal history of other medical treatment: Secondary | ICD-10-CM | POA: Diagnosis not present

## 2021-08-19 DIAGNOSIS — C9001 Multiple myeloma in remission: Secondary | ICD-10-CM | POA: Diagnosis not present

## 2021-08-19 DIAGNOSIS — C9000 Multiple myeloma not having achieved remission: Secondary | ICD-10-CM | POA: Diagnosis not present

## 2021-08-22 DIAGNOSIS — E440 Moderate protein-calorie malnutrition: Secondary | ICD-10-CM | POA: Diagnosis not present

## 2021-08-22 DIAGNOSIS — Z9289 Personal history of other medical treatment: Secondary | ICD-10-CM | POA: Diagnosis not present

## 2021-08-22 DIAGNOSIS — C9000 Multiple myeloma not having achieved remission: Secondary | ICD-10-CM | POA: Diagnosis not present

## 2021-08-22 DIAGNOSIS — Z9484 Stem cells transplant status: Secondary | ICD-10-CM | POA: Diagnosis not present

## 2021-08-31 DIAGNOSIS — D649 Anemia, unspecified: Secondary | ICD-10-CM | POA: Diagnosis not present

## 2021-08-31 DIAGNOSIS — Z9289 Personal history of other medical treatment: Secondary | ICD-10-CM | POA: Diagnosis not present

## 2021-08-31 DIAGNOSIS — C9000 Multiple myeloma not having achieved remission: Secondary | ICD-10-CM | POA: Diagnosis not present

## 2021-09-07 DIAGNOSIS — Z9289 Personal history of other medical treatment: Secondary | ICD-10-CM | POA: Diagnosis not present

## 2021-09-07 DIAGNOSIS — C9000 Multiple myeloma not having achieved remission: Secondary | ICD-10-CM | POA: Diagnosis not present

## 2021-09-07 DIAGNOSIS — Z9484 Stem cells transplant status: Secondary | ICD-10-CM | POA: Diagnosis not present

## 2021-09-07 DIAGNOSIS — R6889 Other general symptoms and signs: Secondary | ICD-10-CM | POA: Diagnosis not present

## 2021-09-07 DIAGNOSIS — E440 Moderate protein-calorie malnutrition: Secondary | ICD-10-CM | POA: Diagnosis not present

## 2021-09-13 ENCOUNTER — Ambulatory Visit: Payer: Self-pay

## 2021-09-13 NOTE — Progress Notes (Signed)
This encounter was created in error - please disregard.

## 2021-09-13 NOTE — Patient Instructions (Signed)
Visit Information  Thank you for taking time to visit with me today. Please don't hesitate to contact me if I can be of assistance to you.   Following are the goals we discussed today:   Goals Addressed      I would like to stay healthy       Care Coordination Interventions: Assessed patient understanding of cancer diagnosis and recommended treatment plan, Reviewed upcoming provider appointments and treatment appointments, Assessed available transportation to appointments and treatments. Has consistent/reliable transportation: Yes, Assessed support system. Has consistent/reliable family or other support: Yes Educated patient on infection control and prevention Educated patient regarding PCP recommendations for Influenza and Pneumococcal vaccines Educated patient regarding CDC recommendations for Shingrix and COVID 19 booster vaccine Determined patient will receive her vaccines at her local [facility] as directed by her doctor and will notify her PCP with her vaccine type/date for update of immunization record Instructed patient to report symptoms of infection promptly for early treatment    Our next appointment is by telephone on 11/08/21 at 09:30 AM   Please call the care guide team at [phone] if you need to cancel or reschedule your appointment.   If you are experiencing a Mental Health or [place] or need someone to talk to, please call 1-800-273-TALK (toll free, 24 hour hotline)  Patient verbalizes understanding of instructions and care plan provided today and agrees to view in [place]. Active MyChart status and patient understanding of how to access instructions and care plan via MyChart confirmed with patient.     Barb Merino, RN, BSN, CCM Care Management Coordinator [facility] Care Management Direct Phone: [phone]

## 2021-09-13 NOTE — Patient Outreach (Signed)
Care Coordination   Follow Up Visit Note   09/13/2021 Name: Emily Greer MRN: [id number] DOB: 1950-03-22  Emily Greer is a 71 y.o. year old female who sees [name], MD for primary care. I spoke with  Emily Greer by phone today  What matters to the patients health and wellness today?  Patient would like to work on staying healthy.     Goals Addressed      I would like to stay healthy       Care Coordination Interventions: Assessed patient understanding of cancer diagnosis and recommended treatment plan, Reviewed upcoming provider appointments and treatment appointments, Assessed available transportation to appointments and treatments. Has consistent/reliable transportation: Yes, Assessed support system. Has consistent/reliable family or other support: Yes Educated patient on infection control and prevention Educated patient regarding PCP recommendations for Influenza and Pneumococcal vaccines Educated patient regarding CDC recommendations for Shingrix and COVID 19 booster vaccine Determined patient will receive her vaccines at her local [facility] as directed by her doctor and will notify her PCP with her vaccine type/date for update of immunization record Instructed patient to report symptoms of infection promptly for early treatment   SDOH assessments and interventions completed:  Yes     Care Coordination Interventions Activated:  Yes  Care Coordination Interventions:  Yes, provided   Follow up plan: Follow up call scheduled for 11/08/21 @  09:30 AM   Encounter Outcome:  Pt. Visit Completed

## 2021-09-15 DIAGNOSIS — H2512 Age-related nuclear cataract, left eye: Secondary | ICD-10-CM | POA: Diagnosis not present

## 2021-09-28 ENCOUNTER — Other Ambulatory Visit: Payer: Self-pay

## 2021-09-28 DIAGNOSIS — C9002 Multiple myeloma in relapse: Secondary | ICD-10-CM

## 2021-09-29 ENCOUNTER — Inpatient Hospital Stay (HOSPITAL_BASED_OUTPATIENT_CLINIC_OR_DEPARTMENT_OTHER): Payer: Medicare Other | Admitting: Hematology & Oncology

## 2021-09-29 ENCOUNTER — Encounter: Payer: Self-pay | Admitting: Hematology & Oncology

## 2021-09-29 ENCOUNTER — Inpatient Hospital Stay: Payer: Medicare Other

## 2021-09-29 ENCOUNTER — Inpatient Hospital Stay: Payer: Medicare Other | Attending: Hematology & Oncology

## 2021-09-29 ENCOUNTER — Telehealth: Payer: Self-pay | Admitting: *Deleted

## 2021-09-29 VITALS — BP 124/69 | HR 66 | Temp 98.5°F | Resp 20 | Ht 63.0 in | Wt 180.1 lb

## 2021-09-29 DIAGNOSIS — C9000 Multiple myeloma not having achieved remission: Secondary | ICD-10-CM | POA: Diagnosis not present

## 2021-09-29 DIAGNOSIS — Z885 Allergy status to narcotic agent status: Secondary | ICD-10-CM | POA: Insufficient documentation

## 2021-09-29 DIAGNOSIS — Z79811 Long term (current) use of aromatase inhibitors: Secondary | ICD-10-CM | POA: Insufficient documentation

## 2021-09-29 DIAGNOSIS — D509 Iron deficiency anemia, unspecified: Secondary | ICD-10-CM | POA: Diagnosis not present

## 2021-09-29 DIAGNOSIS — Z95828 Presence of other vascular implants and grafts: Secondary | ICD-10-CM

## 2021-09-29 DIAGNOSIS — Z8542 Personal history of malignant neoplasm of other parts of uterus: Secondary | ICD-10-CM | POA: Insufficient documentation

## 2021-09-29 DIAGNOSIS — C9002 Multiple myeloma in relapse: Secondary | ICD-10-CM

## 2021-09-29 DIAGNOSIS — Z923 Personal history of irradiation: Secondary | ICD-10-CM | POA: Diagnosis not present

## 2021-09-29 DIAGNOSIS — Z9484 Stem cells transplant status: Secondary | ICD-10-CM | POA: Diagnosis not present

## 2021-09-29 DIAGNOSIS — D500 Iron deficiency anemia secondary to blood loss (chronic): Secondary | ICD-10-CM | POA: Diagnosis not present

## 2021-09-29 DIAGNOSIS — H538 Other visual disturbances: Secondary | ICD-10-CM | POA: Insufficient documentation

## 2021-09-29 LAB — CBC WITH DIFFERENTIAL (CANCER CENTER ONLY)
Abs Immature Granulocytes: 0 10*3/uL (ref 0.00–0.07)
Basophils Absolute: 0 10*3/uL (ref 0.0–0.1)
Basophils Relative: 0 %
Eosinophils Absolute: 0.1 10*3/uL (ref 0.0–0.5)
Eosinophils Relative: 1 %
HCT: 30.8 % — ABNORMAL LOW (ref 36.0–46.0)
Hemoglobin: 9.9 g/dL — ABNORMAL LOW (ref 12.0–15.0)
Lymphocytes Relative: 10 %
Lymphs Abs: 0.3 10*3/uL — ABNORMAL LOW (ref 0.7–4.0)
MCH: 29.3 pg (ref 26.0–34.0)
MCHC: 32.1 g/dL (ref 30.0–36.0)
MCV: 91.1 fL (ref 80.0–100.0)
Monocytes Absolute: 0.4 10*3/uL (ref 0.1–1.0)
Monocytes Relative: 14 %
Neutro Abs: 2.1 10*3/uL (ref 1.7–7.7)
Neutrophils Relative %: 73 %
Platelet Count: 80 10*3/uL — ABNORMAL LOW (ref 150–400)
RBC: 3.38 MIL/uL — ABNORMAL LOW (ref 3.87–5.11)
RDW: 18 % — ABNORMAL HIGH (ref 11.5–15.5)
Smear Review: NORMAL
WBC Count: 2.9 10*3/uL — ABNORMAL LOW (ref 4.0–10.5)
nRBC: 0 % (ref 0.0–0.2)

## 2021-09-29 LAB — CMP (CANCER CENTER ONLY)
ALT: 11 U/L (ref 0–44)
AST: 12 U/L — ABNORMAL LOW (ref 15–41)
Albumin: 4.2 g/dL (ref 3.5–5.0)
Alkaline Phosphatase: 48 U/L (ref 38–126)
Anion gap: 8 (ref 5–15)
BUN: 17 mg/dL (ref 8–23)
CO2: 25 mmol/L (ref 22–32)
Calcium: 9.2 mg/dL (ref 8.9–10.3)
Chloride: 108 mmol/L (ref 98–111)
Creatinine: 0.91 mg/dL (ref 0.44–1.00)
GFR, Estimated: >60 mL/min (ref >60)
Glucose, Bld: 104 mg/dL — ABNORMAL HIGH (ref 70–99)
Potassium: 3.6 mmol/L (ref 3.5–5.1)
Sodium: 141 mmol/L (ref 135–145)
Total Bilirubin: 0.3 mg/dL (ref 0.3–1.2)
Total Protein: 6.2 g/dL — ABNORMAL LOW (ref 6.5–8.1)

## 2021-09-29 LAB — LACTATE DEHYDROGENASE: LDH: 155 U/L (ref 98–192)

## 2021-09-29 MED ORDER — ACYCLOVIR 400 MG PO TABS: 400.0000 mg | ORAL_TABLET | Freq: Two times a day (BID) | ORAL | 6 refills | Status: DC

## 2021-09-29 MED ORDER — HEPARIN SOD (PORK) LOCK FLUSH 100 UNIT/ML IV SOLN
500.0000 [IU] | Freq: Once | INTRAVENOUS | Status: AC
  Administered 2021-09-29: 500 [IU] via INTRAVENOUS

## 2021-09-29 MED ORDER — SODIUM CHLORIDE 0.9% FLUSH
10.0000 mL | Freq: Once | INTRAVENOUS | Status: AC
  Administered 2021-09-29: 10 mL via INTRAVENOUS

## 2021-09-29 NOTE — Telephone Encounter (Signed)
Dr. Ennever notified of ANC-0.4.  No new orders received at this time.  

## 2021-09-29 NOTE — Patient Instructions (Signed)

## 2021-09-29 NOTE — Progress Notes (Signed)
Hematology and Oncology Follow Up Visit  Emily Greer [id number] [birth year] 71 y.o. 09/29/2021   Principle Diagnosis:  Recurrent lambda light chain myeloma - nl cytogenetics History of recurrent endometrial carcinoma Iron deficiency anemia -blood loss   Past Therapy:             Status post second autologous stem cell transplant on 07/24/2014 Maintenance therapy with Pomalidomide/every 2 week Velcade - d/c'ed Radiation therapy for endometrial recurrence - completed 04/20/2015 Pomalyst/Kyprolis 70mg /m2 IV q 2 weeks - s/p cycle #12 - held on 12/26/2017 for hematuria Daratumumab/Pomalyst (1 mg) - status post cycle 19 -- d/c on 08/21/2019 Melflufen 40 mg IV q 4 weeks -- started on 08/27/2019, s/p cycle #2 --  D/c due to FDA removal Selinexor 60 mg po q week -- start on 01/19/2020 -- changed on 03/18/2020 -- d/c on 04/27/2020   Current Therapy:        Blenrep 2.5 mg/m2 IV q 3 weeks -- started on 05/20/2020, s/p cycle #7 --D/C secondary to progression on 12/22/2020 Carfilzomib/Sarclisa -- s/p cycle #3 -- start on 01/03/2021 Femara 2.5 mg po q day  IV iron-Feraheme given on 05/03/2021. Cytoxan/Pomalyst/decadron -- start cycle #1 on 06/15/2021 S/p [place]-[facility] CAR-T infusion -- 07/22/2021   Interim History:  Emily Greer is here today for follow-up.  She really looks fantastic.  She had her CAR-T down to [facility].  This was back in June.  She really did well with this.  She was down there for about 6 weeks.  Thankfully, she did not have any obvious toxicity.  She goes back in October.  She probably have a bone marrow test done.  She has had no problems with fever.  She has had no cough or shortness of breath.  There is been no change in bowel or bladder habits.  She has had no diarrhea.  She has had no rashes.  There is been no weakness in the arms or legs.  She has had no dizziness.  Overall, I would have said that her performance status really is good.  Her performance  status is ECOG 0.    Medications:  Allergies as of 09/29/2021       Reactions   Codeine Nausea Only        Medication List        Accurate as of September 29, 2021 10:15 AM. If you have any questions, ask your nurse or doctor.          STOP taking these medications    HYDROcodone bit-homatropine 5-1.5 MG/5ML syrup Commonly known as: HYCODAN Stopped by: Josph Macho, MD   penicillin v potassium 500 MG tablet Commonly known as: VEETID Stopped by: Josph Macho, MD   Xiidra 5 % Soln Generic drug: Lifitegrast Stopped by: Josph Macho, MD       TAKE these medications    acyclovir 400 MG tablet Commonly known as: ZOVIRAX Take 1 tablet (400 mg total) by mouth 2 (two) times daily.   acyclovir ointment 5 % Commonly known as: Zovirax Apply thin layer to affected area two to three times daily prn   albuterol 108 (90 Base) MCG/ACT inhaler Commonly known as: VENTOLIN HFA Inhale 2 puffs into the lungs every 6 (six) hours as needed for wheezing or shortness of breath. 2 puffs 3 times daily x 5 days then every 6 hours as needed.   amLODipine 10 MG tablet Commonly known as: NORVASC TAKE 1 TABLET(10 MG) BY MOUTH EVERY MORNING  aspirin EC 81 MG tablet Take 81 mg by mouth daily.   azelastine 0.1 % nasal spray Commonly known as: ASTELIN Place 1 spray into both nostrils 2 (two) times daily. Use in each nostril as directed   CALCIUM 1200+D3 PO Take 1 tablet by mouth daily.   cetirizine 10 MG tablet Commonly known as: ZYRTEC Take 1 tablet (10 mg total) by mouth at bedtime.   chlorhexidine 0.12 % solution Commonly known as: PERIDEX 10 mLs 2 (two) times daily.   Fluocinolone Acetonide Scalp 0.01 % Oil Apply topically.   hydrochlorothiazide 12.5 MG capsule Commonly known as: MICROZIDE Take 12.5 mg by mouth every morning.   ibuprofen 800 MG tablet Commonly known as: ADVIL Take 800 mg by mouth 3 (three) times daily.   letrozole 2.5 MG tablet Commonly  known as: Femara Take 1 tablet (2.5 mg total) by mouth daily.   levETIRAcetam 500 MG tablet Commonly known as: KEPPRA Take 500 mg by mouth 2 (two) times daily.   metoprolol succinate 50 MG 24 hr tablet Commonly known as: TOPROL-XL TAKE 1 TABLET(50 MG) BY MOUTH EVERY MORNING   montelukast 10 MG tablet Commonly known as: SINGULAIR TAKE 1 TABLET(10 MG) BY MOUTH AT BEDTIME   ondansetron 8 MG tablet Commonly known as: Zofran Take 1 tablet (8 mg total) by mouth 2 (two) times daily as needed (Nausea or vomiting).   Pomalyst 2 MG capsule Generic drug: pomalidomide TAKE 1 CAPSULE BY MOUTH ONCE DAILY   potassium chloride SA 20 MEQ tablet Commonly known as: KLOR-CON M TAKE 1 TABLET(20 MEQ) BY MOUTH TWICE DAILY   PROBIOTIC DAILY PO Take by mouth daily.   triamcinolone 55 MCG/ACT Aero nasal inhaler Commonly known as: NASACORT Place 2 sprays into the nose as needed.   triamterene-hydrochlorothiazide 37.5-25 MG tablet Commonly known as: MAXZIDE-25 1 tablet by mouth at bedtime   Vitamin D3 50 MCG (2000 UT) Tabs Take 2 tablets by mouth daily.   zinc gluconate 50 MG tablet Take 50 mg by mouth daily.        Allergies:  Allergies  Allergen Reactions   Codeine Nausea Only    Past Medical History, Surgical history, Social history, and Family History were reviewed and updated.  Review of Systems: Review of Systems  Constitutional: Negative.   HENT: Negative.    Eyes:  Positive for blurred vision.  Respiratory: Negative.    Cardiovascular: Negative.   Gastrointestinal: Negative.   Genitourinary: Negative.   Musculoskeletal: Negative.   Skin: Negative.   Neurological: Negative.   Endo/Heme/Allergies: Negative.   Psychiatric/Behavioral: Negative.       Physical Exam:  height is 5\' 3"  (1.6 m) and weight is 180 lb 1.9 oz (81.7 kg). Her oral temperature is 98.5 F (36.9 C). Her blood pressure is 124/69 and her pulse is 66. Her respiration is 20 and oxygen saturation is  99%.   Wt Readings from Last 3 Encounters:  09/29/21 180 lb 1.9 oz (81.7 kg)  07/14/21 180 lb 12.8 oz (82 kg)  06/15/21 177 lb (80.3 kg)    Physical Exam Vitals reviewed.  HENT:     Head: Normocephalic and atraumatic.  Eyes:     Pupils: Pupils are equal, round, and reactive to light.  Cardiovascular:     Rate and Rhythm: Normal rate and regular rhythm.     Heart sounds: Normal heart sounds.  Pulmonary:     Effort: Pulmonary effort is normal.     Breath sounds: Normal breath sounds.  Abdominal:  General: Bowel sounds are normal.     Palpations: Abdomen is soft.  Musculoskeletal:        General: No tenderness or deformity. Normal range of motion.     Cervical back: Normal range of motion.  Lymphadenopathy:     Cervical: No cervical adenopathy.  Skin:    General: Skin is warm and dry.     Findings: No erythema or rash.  Neurological:     Mental Status: She is alert and oriented to person, place, and time.  Psychiatric:        Behavior: Behavior normal.        Thought Content: Thought content normal.        Judgment: Judgment normal.      Lab Results  Component Value Date   WBC 3.7 (L) 07/14/2021   HGB 8.2 (L) 07/14/2021   HCT 25.8 (L) 07/14/2021   MCV 86.6 07/14/2021   PLT 205 07/14/2021   Lab Results  Component Value Date   FERRITIN 1,312 (H) 07/14/2021   IRON 24 (L) 07/14/2021   TIBC 316 07/14/2021   UIBC 292 07/14/2021   IRONPCTSAT 8 (L) 07/14/2021   Lab Results  Component Value Date   RETICCTPCT 3.6 (H) 05/03/2021   RBC 2.98 (L) 07/14/2021   Lab Results  Component Value Date   KPAFRELGTCHN 8.5 07/14/2021   LAMBDASER 28.2 (H) 07/14/2021   KAPLAMBRATIO 0.30 07/14/2021   Lab Results  Component Value Date   IGGSERUM 411 (L) 07/14/2021   IGA 31 (L) 07/14/2021   IGMSERUM <5 (L) 07/14/2021   Lab Results  Component Value Date   TOTALPROTELP 6.3 04/12/2021   ALBUMINELP 3.6 04/12/2021   A1GS 0.3 04/12/2021   A2GS 1.0 04/12/2021   BETS 1.0  04/12/2021   BETA2SER 0.4 11/23/2014   GAMS 0.4 04/12/2021   MSPIKE 0.1 (H) 04/12/2021   SPEI Comment 10/19/2020     Chemistry      Component Value Date/Time   NA 141 09/29/2021 0933   NA 141 01/10/2017 1115   NA 140 06/21/2016 0918   K 3.6 09/29/2021 0933   K 4.0 01/10/2017 1115   K 4.3 06/21/2016 0918   CL 108 09/29/2021 0933   CL 106 01/10/2017 1115   CO2 25 09/29/2021 0933   CO2 27 01/10/2017 1115   CO2 20 (L) 06/21/2016 0918   BUN 17 09/29/2021 0933   BUN 15 01/10/2017 1115   BUN 15.8 06/21/2016 0918   CREATININE 0.91 09/29/2021 0933   CREATININE 1.0 01/10/2017 1115   CREATININE 0.8 06/21/2016 0918      Component Value Date/Time   CALCIUM 9.2 09/29/2021 0933   CALCIUM 9.5 01/10/2017 1115   CALCIUM 9.4 06/21/2016 0918   ALKPHOS 48 09/29/2021 0933   ALKPHOS 40 01/10/2017 1115   ALKPHOS 66 06/21/2016 0918   AST 12 (L) 09/29/2021 0933   AST 17 06/21/2016 0918   ALT 11 09/29/2021 0933   ALT 19 01/10/2017 1115   ALT 37 06/21/2016 0918   BILITOT 0.3 09/29/2021 0933   BILITOT 0.32 06/21/2016 0918       Impression and Plan: Ms. Delorbe is a very pleasant 71 yo African American female with recurrent lambda light chain myeloma.  She had her second stem cell transplant for light chain myeloma was in June 2016.   Hopefully, the CAR-T therapy is going to help.  We will have to see what her light chain levels are.  This will certainly give [place] an idea as to  how she has responded.    She is still quite anemic.  We will have to watch this closely.  I am just glad that she got through the treatment.  Again, hopefully will help her.  We will plan to get her back and see [place] in about 3 weeks.  Some point, we probably will have to get her back onto [place].  Josph Macho, MD 8/31/202310:15 AM

## 2021-09-30 ENCOUNTER — Encounter: Payer: Self-pay | Admitting: *Deleted

## 2021-09-30 LAB — KAPPA/LAMBDA LIGHT CHAINS
Kappa free light chain: <0.7 mg/L — ABNORMAL LOW (ref 3.3–19.4)
Kappa, lambda light chain ratio: UNDETERMINED
Lambda free light chains: <1.5 mg/L — ABNORMAL LOW (ref 5.7–26.3)

## 2021-09-30 LAB — BETA 2 MICROGLOBULIN, SERUM: Beta-2 Microglobulin: 1.3 mg/L (ref 0.6–2.4)

## 2021-10-04 LAB — PROTEIN ELECTROPHORESIS, SERUM, WITH REFLEX
A/G Ratio: 1.9 — ABNORMAL HIGH (ref 0.7–1.7)
Albumin ELP: 3.7 g/dL (ref 2.9–4.4)
Alpha-1-Globulin: 0.2 g/dL (ref 0.0–0.4)
Alpha-2-Globulin: 0.7 g/dL (ref 0.4–1.0)
Beta Globulin: 0.9 g/dL (ref 0.7–1.3)
Gamma Globulin: 0.3 g/dL — ABNORMAL LOW (ref 0.4–1.8)
Globulin, Total: 2 g/dL — ABNORMAL LOW (ref 2.2–3.9)
Total Protein ELP: 5.7 g/dL — ABNORMAL LOW (ref 6.0–8.5)

## 2021-10-04 LAB — IGG, IGA, IGM
IgA: <5 mg/dL — ABNORMAL LOW (ref 87–352)
IgG (Immunoglobin G), Serum: 244 mg/dL — ABNORMAL LOW (ref 586–1602)
IgM (Immunoglobulin M), Srm: <5 mg/dL — ABNORMAL LOW (ref 26–217)

## 2021-10-12 ENCOUNTER — Ambulatory Visit (INDEPENDENT_AMBULATORY_CARE_PROVIDER_SITE_OTHER): Payer: Medicare Other

## 2021-10-12 VITALS — BP 128/64 | HR 95 | Temp 98.3°F | Ht 64.0 in | Wt 177.4 lb

## 2021-10-12 DIAGNOSIS — Z0000 Encounter for general adult medical examination without abnormal findings: Secondary | ICD-10-CM

## 2021-10-12 DIAGNOSIS — Z23 Encounter for immunization: Secondary | ICD-10-CM

## 2021-10-12 NOTE — Progress Notes (Signed)
Subjective:   Emily Greer is a 71 y.o. female who presents for Medicare Annual (Subsequent) preventive examination.  Review of Systems     Cardiac Risk Factors include: advanced age (>39mn, >>7women);hypertension;obesity (BMI >30kg/m2)     Objective:    Today's Vitals   10/12/21 1542  BP: 128/64  Pulse: 95  Temp: 98.3 F (36.8 C)  TempSrc: Oral  SpO2: 96%  Weight: 177 lb 6.4 oz (80.5 kg)  Height: '5\' 4"'$  (1.626 m)   Body mass index is 30.45 kg/m.     10/12/2021    3:51 PM 09/29/2021    9:33 AM 07/14/2021    8:31 AM 06/15/2021    9:54 AM 05/23/2021    8:57 AM 04/25/2021   10:05 AM 04/12/2021    9:33 AM  Advanced Directives  Does Patient Have a Medical Advance Directive? No No No No No No No  Does patient want to make changes to medical advance directive?   No - Patient declined No - Patient declined  No - Patient declined No - Patient declined  Would patient like information on creating a medical advance directive? No - Patient declined No - Patient declined No - Patient declined No - Patient declined No - Patient declined No - Patient declined     Current Medications (verified) Outpatient Encounter Medications as of 10/12/2021  Medication Sig   acyclovir (ZOVIRAX) 400 MG tablet Take 1 tablet (400 mg total) by mouth 2 (two) times daily.   albuterol (VENTOLIN HFA) 108 (90 Base) MCG/ACT inhaler Inhale 2 puffs into the lungs every 6 (six) hours as needed for wheezing or shortness of breath. 2 puffs 3 times daily x 5 days then every 6 hours as needed.   amLODipine (NORVASC) 10 MG tablet TAKE 1 TABLET(10 MG) BY MOUTH EVERY MORNING   aspirin EC 81 MG tablet Take 81 mg by mouth daily.   Calcium-Magnesium-Vitamin D (CALCIUM 1200+D3 PO) Take 1 tablet by mouth daily.   cetirizine (ZYRTEC) 10 MG tablet Take 1 tablet (10 mg total) by mouth at bedtime.   chlorhexidine (PERIDEX) 0.12 % solution 10 mLs 2 (two) times daily.   Cholecalciferol (VITAMIN D3) 2000 UNITS TABS Take 2  tablets by mouth daily.   hydrochlorothiazide (MICROZIDE) 12.5 MG capsule Take 12.5 mg by mouth every morning.   ibuprofen (ADVIL) 800 MG tablet Take 800 mg by mouth 3 (three) times daily.   letrozole (FEMARA) 2.5 MG tablet Take 1 tablet (2.5 mg total) by mouth daily.   levETIRAcetam (KEPPRA) 500 MG tablet Take 500 mg by mouth 2 (two) times daily.   metoprolol succinate (TOPROL-XL) 50 MG 24 hr tablet TAKE 1 TABLET(50 MG) BY MOUTH EVERY MORNING   montelukast (SINGULAIR) 10 MG tablet TAKE 1 TABLET(10 MG) BY MOUTH AT BEDTIME   potassium chloride SA (KLOR-CON) 20 MEQ tablet TAKE 1 TABLET(20 MEQ) BY MOUTH TWICE DAILY   Probiotic Product (PROBIOTIC DAILY PO) Take by mouth daily.   triamterene-hydrochlorothiazide (MAXZIDE-25) 37.5-25 MG tablet 1 tablet by mouth at bedtime   zinc gluconate 50 MG tablet Take 50 mg by mouth daily.   acyclovir ointment (ZOVIRAX) 5 % Apply thin layer to affected area two to three times daily prn (Patient not taking: Reported on 09/29/2021)   azelastine (ASTELIN) 0.1 % nasal spray Place 1 spray into both nostrils 2 (two) times daily. Use in each nostril as directed (Patient not taking: Reported on 09/29/2021)   Fluocinolone Acetonide Scalp 0.01 % OIL Apply topically. (Patient not  taking: Reported on 09/29/2021)   ondansetron (ZOFRAN) 8 MG tablet Take 1 tablet (8 mg total) by mouth 2 (two) times daily as needed (Nausea or vomiting). (Patient not taking: Reported on 09/29/2021)   pomalidomide (POMALYST) 2 MG capsule TAKE 1 CAPSULE BY MOUTH ONCE DAILY (Patient not taking: Reported on 09/29/2021)   triamcinolone (NASACORT) 55 MCG/ACT AERO nasal inhaler Place 2 sprays into the nose as needed.  (Patient not taking: Reported on 09/29/2021)   [DISCONTINUED] prochlorperazine (COMPAZINE) 10 MG tablet Take 1 tablet (10 mg total) by mouth every 6 (six) hours as needed (Nausea or vomiting).   Facility-Administered Encounter Medications as of 10/12/2021  Medication   0.9 %  sodium chloride  infusion   albuterol (PROVENTIL) (2.5 MG/3ML) 0.083% nebulizer solution 2.5 mg   sodium chloride flush (NS) 0.9 % injection 10 mL   sodium chloride flush (NS) 0.9 % injection 10 mL   sodium chloride flush (NS) 0.9 % injection 10 mL   sodium chloride flush (NS) 0.9 % injection 10 mL   sodium chloride flush (NS) 0.9 % injection 10 mL   sodium chloride flush (NS) 0.9 % injection 10 mL   sodium chloride flush (NS) 0.9 % injection 10 mL    Allergies (verified) Codeine   History: Past Medical History:  Diagnosis Date   Avascular necrosis of femoral head ([place])    bilateral per CT 07-26-2015   Endometrial carcinoma [facility]) gyn oncologist-  dr Skeet Latch ([facility])/  radiation oncologist-- dr Sondra Come   2013 dx  FIGO Stage 1A, Grade 2 endometrioid endometrial cancer s/p TAH w/ BSO and bilateral pelvic node dissection 10-31-2011 ;  recurrence at distal vagina 04/ 2014 s/p  brachytherapy (ended 07-29-2012);  2nd recurrence 12/ 2016  vaginal apex s/p  conformational radiotherapy 03-10-2015 to 04-20-2015   Family history of adverse reaction to anesthesia    mother ponv   GERD (gastroesophageal reflux disease)    Goals of care, counseling/discussion 08/21/2019   H/O stem cell transplant ([place])    02/ 2000 and second one 06/ 2016   History of bacteremia    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   History of chemotherapy    last chemo 12-26-17   History of radiation therapy 6/4, 6/11, 6/19, 6/25, 07/29/2012   vagina 30.5 gray in 5 fx, HDR brachytherapy:   last radiation to vagina 03-10-2015 to 04-20-2015  50.4gray   History of radiation therapy 03/10/15-04/20/15   vagina 50.4 in 28 fractions   Hypertension    Lambda light chain myeloma [facility]) oncologist-  dr Marin Olp ([facility])  and  [facility] -- dr Salena Saner gasparetto   dx 07/ 1999 s/p  VAD chemotherapy 11/ 1999,  purged autotransplant 03-21-1998 followed by auto stem cell transplant 03-29-1998;  recurrance w/ second  autologous stem cell transplant 07-24-2014;  in Re-mission currently , chemo maintenance therapy   Osteoporosis 12/18/05   Increased  risk    PONV (postoperative nausea and vomiting)    Wears glasses    Past Surgical History:  Procedure Laterality Date   ABDOMINAL HYSTERECTOMY  10/31/2011   Procedure: HYSTERECTOMY ABDOMINAL;  Surgeon: [name], MD PHD;  Location: [facility];  Service: Gynecology;  Laterality: N/A;   CO2 LASER APPLICATION N/A 04/25/7122   Procedure: CO2 LASER APPLICATION;  Surgeon: [name], MD;  Location: [facility];  Service: Gynecology;  Laterality: N/A;   CYSTOSCOPY WITH RETROGRADE PYELOGRAM, URETEROSCOPY AND STENT PLACEMENT Left 02/04/2018   Procedure: CYSTOSCOPY WITH RETROGRADE  PYELOGRAM, URETEROSCOPY , bladder biopsy and fulgeration;  Surgeon: Cleon Gustin, MD;  Location: [facility];  Service: Urology;  Laterality: Left;   ECTOPIC PREGNANCY SURGERY  1992   HYSTEROSCOPY WITH D & C  09/27/2011   Procedure: DILATATION AND CURETTAGE /HYSTEROSCOPY;  Surgeon: Eldred Manges, MD;  Location: [place];  Service: Gynecology;;   IR FLUORO GUIDE PORT INSERTION RIGHT  09/08/2016   IR US GUIDE VASC ACCESS RIGHT  09/08/2016   LAPAROTOMY  10/31/2011   Procedure: EXPLORATORY LAPAROTOMY;  Surgeon: [name], MD PHD;  Location: [facility];  Service: Gynecology;  Laterality: N/A;   SALPINGOOPHORECTOMY  10/31/2011   Procedure: SALPINGO OOPHERECTOMY;  Surgeon: [name], MD PHD;  Location: [facility];  Service: Gynecology;  Laterality: Bilateral;   Lymph Nodes sampling   TUBAL LIGATION  1986   Family History  Problem Relation Age of Onset   Colon cancer Mother    Hypertension Father    Heart Problems Father    Social History   Socioeconomic History   Marital status: Married    Spouse name: Not on file   Number of children: Not on file   Years of education: Not on file   Highest education level: Not on file  Occupational History    Occupation: retired  Tobacco Use   Smoking status: Never   Smokeless tobacco: Never  Vaping Use   Vaping Use: Never used  Substance and Sexual Activity   Alcohol use: Not Currently    Alcohol/week: 0.0 standard drinks of alcohol   Drug use: Never   Sexual activity: Yes    Birth control/protection: Surgical  Other Topics Concern   Not on file  Social History Narrative   Not on file   Social Determinants of Health   Financial Resource Strain: Low Risk  (10/12/2021)   Overall Financial Resource Strain (CARDIA)    Difficulty of Paying Living Expenses: Not hard at all  Food Insecurity: No Food Insecurity (10/12/2021)   Hunger Vital Sign    Worried About Running Out of Food in the Last Year: Never true    [place] in the Last Year: Never true  Transportation Needs: No Transportation Needs (10/12/2021)   PRAPARE - Hydrologist (Medical): No    Lack of Transportation (Non-Medical): No  Physical Activity: Inactive (10/12/2021)   Exercise Vital Sign    Days of Exercise per Week: 0 days    Minutes of Exercise per Session: 0 min  Stress: No Stress Concern Present (10/12/2021)   [place]    Feeling of Stress : Not at all  Social Connections: Not on file    Tobacco Counseling Counseling given: Not Answered   Clinical Intake:  Pre-visit preparation completed: Yes        Nutritional Status: BMI > 30  Obese Nutritional Risks: None Diabetes: No  How often do you need to have someone help you when you read instructions, pamphlets, or other written materials from your doctor or pharmacy?: 1 - Never What is the last grade level you completed in school?: NAllen LPN  Diabetic? no  Interpreter Needed?: No  Information entered by :: NAllen LPN   Activities of Daily Living    10/12/2021    3:53 PM  In your present state of health, do you have any difficulty performing the  following activities:  Hearing? 0  Vision? 1  Comment a little blurry  still from cataract surgery  Difficulty concentrating or making decisions? 0  Walking or climbing stairs? 0  Dressing or bathing? 0  Doing errands, shopping? 0  Preparing Food and eating ? N  Using the Toilet? N  In the past six months, have you accidently leaked urine? Y  Do you have problems with loss of bowel control? N  Managing your Medications? N  Managing your Finances? N  Housekeeping or managing your Housekeeping? N    Patient Care Team: Glendale Chard, MD as PCP - General (Internal Medicine) Rex Kras, Claudette Stapler, RN as [place] any recent Medical Services you may have received from other than [facility] providers in the past year (date may be approximate).     Assessment:   This is a routine wellness examination for [place].  Hearing/Vision screen Vision Screening - Comments:: Regular eye exams, [facility]  Dietary issues and exercise activities discussed: Current Exercise Habits: The patient does not participate in regular exercise at present   Goals Addressed             This Visit's Progress    Patient Stated       10/12/2021, wants to start exercising and cut back on medications       Depression Screen    10/12/2021    3:52 PM 09/29/2020    2:07 PM 09/24/2019   12:04 PM 09/23/2019    4:41 PM 09/18/2018   12:04 PM 03/19/2018    3:09 PM 02/18/2018    3:58 PM  PHQ 2/9 Scores  PHQ - 2 Score 0 0 0 0 0 0 0  PHQ- 9 Score   0        Fall Risk    10/12/2021    3:52 PM 09/29/2020    2:07 PM 09/24/2019   12:04 PM 09/23/2019    4:41 PM 09/18/2018   12:04 PM  [place] in the past year? 1 0 0 0 0  Comment tripped on elevator      Number falls in past yr: 0      Injury with Fall? 0      Risk for fall due to : Medication side effect Medication side effect Medication side effect  Medication side effect  Follow up Falls evaluation  completed;Education provided;Falls prevention discussed Falls evaluation completed;Education provided;Falls prevention discussed Falls evaluation completed;Education provided;Falls prevention discussed      FALL RISK PREVENTION PERTAINING TO THE HOME:  Any stairs in or around the home? Yes  If so, are there any without handrails? No  Home free of loose throw rugs in walkways, pet beds, electrical cords, etc? Yes  Adequate lighting in your home to reduce risk of falls? Yes   ASSISTIVE DEVICES UTILIZED TO PREVENT FALLS:  Life alert? No  Use of a cane, walker or w/c? No  Grab bars in the bathroom? Yes  Shower chair or bench in shower? Yes  Elevated toilet seat or a handicapped toilet? No   TIMED UP AND GO:  Was the test performed? Yes .  Length of time to ambulate 10 feet: 4 sec.   Gait steady and fast without use of assistive device  Cognitive Function:        10/12/2021    3:56 PM 09/29/2020    2:09 PM 09/24/2019   12:07 PM 09/18/2018   12:05 PM  6CIT Screen  What Year? 0 points 0 points 0 points 0 points  What month?  0 points 0 points 0 points 0 points  What time? 0 points 0 points 0 points 0 points  Count back from 20 0 points 0 points 0 points 0 points  Months in reverse 0 points 0 points 0 points 0 points  Repeat phrase 4 points 0 points 4 points 0 points  Total Score 4 points 0 points 4 points 0 points    Immunizations Immunization History  Administered Date(s) Administered   DTaP 08/25/2015, 10/28/2015   Fluad Quad(high Dose 65+) 11/19/2020, 10/12/2021   HIB (PRP-T) 08/25/2015, 10/28/2015   Hepatitis B, adult 08/25/2015, 10/28/2015   IPV 08/25/2015, 10/28/2015   Influenza Split 11/01/2011   Influenza, High Dose Seasonal PF 11/12/2018   Influenza,inj,Quad PF,6+ Mos 11/03/2015, 10/31/2017   Influenza-Unspecified 10/30/2013, 10/28/2014   PFIZER Comirnaty(Gray Top)Covid-19 Tri-Sucrose Vaccine 11/19/2020   PFIZER(Purple Top)SARS-COV-2 Vaccination 02/19/2019,  03/12/2019, 11/11/2019, 06/09/2020   Pneumococcal Conjugate-13 02/12/2015, 08/25/2015, 10/28/2015   Pneumococcal Polysaccharide-23 11/05/2013   Tdap 08/27/2015    TDAP status: Up to date  Flu Vaccine status: Completed at today's visit  Pneumococcal vaccine status: Up to date  Covid-19 vaccine status: Completed vaccines  Qualifies for Shingles Vaccine? Yes   Zostavax completed No   Shingrix Completed?: No.    Education has been provided regarding the importance of this vaccine. Patient has been advised to call insurance company to determine out of pocket expense if they have not yet received this vaccine. Advised may also receive vaccine at local pharmacy or Health Dept. Verbalized acceptance and understanding.  Screening Tests Health Maintenance  Topic Date Due   Hepatitis C Screening  Never done   Zoster Vaccines- Shingrix (1 of 2) Never done   Pneumonia Vaccine 63+ Years old (3 - PPSV23 or PCV20) 11/06/2018   COVID-19 Vaccine (6 - Pfizer risk series) 01/14/2021   MAMMOGRAM  01/01/2023   TETANUS/TDAP  08/26/2025   COLONOSCOPY (Pts 45-42yr Insurance coverage will need to be confirmed)  08/11/2027   INFLUENZA VACCINE  Completed   DEXA SCAN  Completed   HPV VACCINES  Aged Out    Health Maintenance  Health Maintenance Due  Topic Date Due   Hepatitis C Screening  Never done   Zoster Vaccines- Shingrix (1 of 2) Never done   Pneumonia Vaccine 69 Years old (354- PPSV23 or PCV20) 11/06/2018   COVID-19 Vaccine (6 - Pfizer risk series) 01/14/2021    Colorectal cancer screening: Type of screening: Colonoscopy. Completed 08/17/2017. Repeat every 10 years  Mammogram status: Completed 12/31/2020. Repeat every year  Bone Density status: scheduled for 10/26/2021  Lung Cancer Screening: (Low Dose CT Chest recommended if Age 69-80years, 30 pack-year currently smoking OR have quit w/in 15years.) does not qualify.   Lung Cancer Screening Referral: no  Additional  Screening:  Hepatitis C Screening: does qualify;   Vision Screening: Recommended annual ophthalmology exams for early detection of glaucoma and other disorders of the eye. Is the patient up to date with their annual eye exam?  Yes  Who is the provider or what is the name of the office in which the patient attends annual eye exams? Dr. GKaty FitchIf pt is not established with a provider, would they like to be referred to a provider to establish care? No .   Dental Screening: Recommended annual dental exams for proper oral hygiene  Community Resource Referral / Chronic Care Management: CRR required this visit?  No   CCM required this visit?  No      Plan:  I have personally reviewed and noted the following in the patient's chart:   Medical and social history Use of alcohol, tobacco or illicit drugs  Current medications and supplements including opioid prescriptions. Patient is not currently taking opioid prescriptions. Functional ability and status Nutritional status Physical activity Advanced directives List of other physicians Hospitalizations, surgeries, and ER visits in previous 12 months Vitals Screenings to include cognitive, depression, and falls Referrals and appointments  In addition, I have reviewed and discussed with patient certain preventive protocols, quality metrics, and best practice recommendations. A written personalized care plan for preventive services as well as general preventive health recommendations were provided to patient.     Kellie Simmering, LPN   6/50/3546   Nurse Notes: none

## 2021-10-12 NOTE — Patient Instructions (Addendum)
Emily Greer , Thank you for taking time to come for your Medicare Wellness Visit. I appreciate your ongoing commitment to your health goals. Please review the following plan we discussed and let me know if I can assist you in the future.   Screening recommendations/referrals: Colonoscopy: completed 08/10/2017, due 08/11/2027 Mammogram: completed 12/31/2020, due 01/01/2022 Bone Density: scheduled for 10/26/2021, [facility]  Recommended yearly ophthalmology/optometry visit for glaucoma screening and checkup Recommended yearly dental visit for hygiene and checkup  Vaccinations: Influenza vaccine: today Pneumococcal vaccine: completed 10/28/2015 Tdap vaccine: completed 08/11/2015, due 08/10/2025 Shingles vaccine: discussed   Covid-19: 11/19/2020, 06/09/2020, 11/11/2019, 03/12/2019, 02/19/2019  Advanced directives: Advance directive discussed with you today. Even though you declined this today please call our office should you change your mind and we can give you the proper paperwork for you to fill out.  Conditions/risks identified: none  Next appointment: Follow up in one year for your annual wellness visit    Preventive Care 65 Years and Older, Female Preventive care refers to lifestyle choices and visits with your health care provider that can promote health and wellness. What does preventive care include? A yearly physical exam. This is also called an annual well check. Dental exams once or twice a year. Routine eye exams. Ask your health care provider how often you should have your eyes checked. Personal lifestyle choices, including: Daily care of your teeth and gums. Regular physical activity. Eating a healthy diet. Avoiding tobacco and drug use. Limiting alcohol use. Practicing safe sex. Taking low-dose aspirin every day. Taking vitamin and mineral supplements as recommended by your health care provider. What happens during an annual well check? The services and  screenings done by your health care provider during your annual well check will depend on your age, overall health, lifestyle risk factors, and family history of disease. Counseling  Your health care provider may ask you questions about your: Alcohol use. Tobacco use. Drug use. Emotional well-being. Home and relationship well-being. Sexual activity. Eating habits. History of falls. Memory and ability to understand (cognition). Work and work Statistician. Reproductive health. Screening  You may have the following tests or measurements: Height, weight, and BMI. Blood pressure. Lipid and cholesterol levels. These may be checked every 5 years, or more frequently if you are over 67 years old. Skin check. Lung cancer screening. You may have this screening every year starting at age 38 if you have a 30-pack-year history of smoking and currently smoke or have quit within the past 15 years. Fecal occult blood test (FOBT) of the stool. You may have this test every year starting at age 12. Flexible sigmoidoscopy or colonoscopy. You may have a sigmoidoscopy every 5 years or a colonoscopy every 10 years starting at age 21. Hepatitis C blood test. Hepatitis B blood test. Sexually transmitted disease (STD) testing. Diabetes screening. This is done by checking your blood sugar (glucose) after you have not eaten for a while (fasting). You may have this done every 1-3 years. Bone density scan. This is done to screen for osteoporosis. You may have this done starting at age 51. Mammogram. This may be done every 1-2 years. Talk to your health care provider about how often you should have regular mammograms. Talk with your health care provider about your test results, treatment options, and if necessary, the need for more tests. Vaccines  Your health care provider may recommend certain vaccines, such as: Influenza vaccine. This is recommended every year. Tetanus, diphtheria, and acellular pertussis (Tdap,  Td) vaccine. You may need a Td booster every 10 years. Zoster vaccine. You may need this after age 65. Pneumococcal 13-valent conjugate (PCV13) vaccine. One dose is recommended after age 107. Pneumococcal polysaccharide (PPSV23) vaccine. One dose is recommended after age 63. Talk to your health care provider about which screenings and vaccines you need and how often you need them. This information is not intended to replace advice given to you by your health care provider. Make sure you discuss any questions you have with your health care provider. Document Released: 02/12/2015 Document Revised: 10/06/2015 Document Reviewed: 11/17/2014 Elsevier Interactive Patient Education  2017 [place] Prevention in the Home Falls can cause injuries. They can happen to people of all ages. There are many things you can do to make your home safe and to help prevent falls. What can I do on the outside of my home? Regularly fix the edges of walkways and driveways and fix any cracks. Remove anything that might make you trip as you walk through a door, such as a raised step or threshold. Trim any bushes or trees on the path to your home. Use bright outdoor lighting. Clear any walking paths of anything that might make someone trip, such as rocks or tools. Regularly check to see if handrails are loose or broken. Make sure that both sides of any steps have handrails. Any raised decks and porches should have guardrails on the edges. Have any leaves, snow, or ice cleared regularly. Use sand or salt on walking paths during winter. Clean up any spills in your garage right away. This includes oil or grease spills. What can I do in the bathroom? Use night lights. Install grab bars by the toilet and in the tub and shower. Do not use towel bars as grab bars. Use non-skid mats or decals in the tub or shower. If you need to sit down in the shower, use a plastic, non-slip stool. Keep the floor dry. Clean up any  water that spills on the floor as soon as it happens. Remove soap buildup in the tub or shower regularly. Attach bath mats securely with double-sided non-slip rug tape. Do not have throw rugs and other things on the floor that can make you trip. What can I do in the bedroom? Use night lights. Make sure that you have a light by your bed that is easy to reach. Do not use any sheets or blankets that are too big for your bed. They should not hang down onto the floor. Have a firm chair that has side arms. You can use this for support while you get dressed. Do not have throw rugs and other things on the floor that can make you trip. What can I do in the kitchen? Clean up any spills right away. Avoid walking on wet floors. Keep items that you use a lot in easy-to-reach places. If you need to reach something above you, use a strong step stool that has a grab bar. Keep electrical cords out of the way. Do not use floor polish or wax that makes floors slippery. If you must use wax, use non-skid floor wax. Do not have throw rugs and other things on the floor that can make you trip. What can I do with my stairs? Do not leave any items on the stairs. Make sure that there are handrails on both sides of the stairs and use them. Fix handrails that are broken or loose. Make sure that handrails are as long  as the stairways. Check any carpeting to make sure that it is firmly attached to the stairs. Fix any carpet that is loose or worn. Avoid having throw rugs at the top or bottom of the stairs. If you do have throw rugs, attach them to the floor with carpet tape. Make sure that you have a light switch at the top of the stairs and the bottom of the stairs. If you do not have them, ask someone to add them for you. What else can I do to help prevent falls? Wear shoes that: Do not have high heels. Have rubber bottoms. Are comfortable and fit you well. Are closed at the toe. Do not wear sandals. If you use a  stepladder: Make sure that it is fully opened. Do not climb a closed stepladder. Make sure that both sides of the stepladder are locked into place. Ask someone to hold it for you, if possible. Clearly mark and make sure that you can see: Any grab bars or handrails. First and last steps. Where the edge of each step is. Use tools that help you move around (mobility aids) if they are needed. These include: Canes. Walkers. Scooters. Crutches. Turn on the lights when you go into a dark area. Replace any light bulbs as soon as they burn out. Set up your furniture so you have a clear path. Avoid moving your furniture around. If any of your floors are uneven, fix them. If there are any pets around you, be aware of where they are. Review your medicines with your doctor. Some medicines can make you feel dizzy. This can increase your chance of falling. Ask your doctor what other things that you can do to help prevent falls. This information is not intended to replace advice given to you by your health care provider. Make sure you discuss any questions you have with your health care provider. Document Released: 11/12/2008 Document Revised: 06/24/2015 Document Reviewed: 02/20/2014 Elsevier Interactive Patient Education  2017 [employer].

## 2021-10-19 ENCOUNTER — Inpatient Hospital Stay: Payer: Medicare Other

## 2021-10-19 ENCOUNTER — Inpatient Hospital Stay: Payer: Medicare Other | Attending: Hematology & Oncology

## 2021-10-19 ENCOUNTER — Encounter: Payer: Self-pay | Admitting: Hematology & Oncology

## 2021-10-19 ENCOUNTER — Other Ambulatory Visit: Payer: Self-pay

## 2021-10-19 ENCOUNTER — Inpatient Hospital Stay (HOSPITAL_BASED_OUTPATIENT_CLINIC_OR_DEPARTMENT_OTHER): Payer: Medicare Other | Admitting: Hematology & Oncology

## 2021-10-19 VITALS — BP 140/67 | HR 69 | Temp 99.0°F | Resp 18 | Ht 64.0 in | Wt 177.6 lb

## 2021-10-19 DIAGNOSIS — C9000 Multiple myeloma not having achieved remission: Secondary | ICD-10-CM

## 2021-10-19 DIAGNOSIS — Z9484 Stem cells transplant status: Secondary | ICD-10-CM | POA: Diagnosis not present

## 2021-10-19 DIAGNOSIS — C9001 Multiple myeloma in remission: Secondary | ICD-10-CM

## 2021-10-19 DIAGNOSIS — D509 Iron deficiency anemia, unspecified: Secondary | ICD-10-CM | POA: Diagnosis not present

## 2021-10-19 DIAGNOSIS — C7982 Secondary malignant neoplasm of genital organs: Secondary | ICD-10-CM | POA: Diagnosis not present

## 2021-10-19 DIAGNOSIS — C541 Malignant neoplasm of endometrium: Secondary | ICD-10-CM | POA: Diagnosis not present

## 2021-10-19 DIAGNOSIS — Z8542 Personal history of malignant neoplasm of other parts of uterus: Secondary | ICD-10-CM | POA: Insufficient documentation

## 2021-10-19 DIAGNOSIS — D500 Iron deficiency anemia secondary to blood loss (chronic): Secondary | ICD-10-CM | POA: Diagnosis not present

## 2021-10-19 DIAGNOSIS — Z79811 Long term (current) use of aromatase inhibitors: Secondary | ICD-10-CM | POA: Diagnosis not present

## 2021-10-19 DIAGNOSIS — Z923 Personal history of irradiation: Secondary | ICD-10-CM | POA: Diagnosis not present

## 2021-10-19 DIAGNOSIS — Z79899 Other long term (current) drug therapy: Secondary | ICD-10-CM | POA: Insufficient documentation

## 2021-10-19 DIAGNOSIS — H538 Other visual disturbances: Secondary | ICD-10-CM | POA: Insufficient documentation

## 2021-10-19 LAB — CBC WITH DIFFERENTIAL (CANCER CENTER ONLY)
Abs Immature Granulocytes: 0.12 10*3/uL — ABNORMAL HIGH (ref 0.00–0.07)
Basophils Absolute: 0 10*3/uL (ref 0.0–0.1)
Basophils Relative: 2 %
Eosinophils Absolute: 0 10*3/uL (ref 0.0–0.5)
Eosinophils Relative: 2 %
HCT: 29.8 % — ABNORMAL LOW (ref 36.0–46.0)
Hemoglobin: 9.6 g/dL — ABNORMAL LOW (ref 12.0–15.0)
Immature Granulocytes: 6 %
Lymphocytes Relative: 15 %
Lymphs Abs: 0.3 10*3/uL — ABNORMAL LOW (ref 0.7–4.0)
MCH: 29.4 pg (ref 26.0–34.0)
MCHC: 32.2 g/dL (ref 30.0–36.0)
MCV: 91.1 fL (ref 80.0–100.0)
Monocytes Absolute: 0.3 10*3/uL (ref 0.1–1.0)
Monocytes Relative: 16 %
Neutro Abs: 1.2 10*3/uL — ABNORMAL LOW (ref 1.7–7.7)
Neutrophils Relative %: 59 %
Platelet Count: 76 10*3/uL — ABNORMAL LOW (ref 150–400)
RBC: 3.27 MIL/uL — ABNORMAL LOW (ref 3.87–5.11)
RDW: 16.2 % — ABNORMAL HIGH (ref 11.5–15.5)
Smear Review: NORMAL
WBC Count: 2 10*3/uL — ABNORMAL LOW (ref 4.0–10.5)
nRBC: 0 % (ref 0.0–0.2)

## 2021-10-19 LAB — CMP (CANCER CENTER ONLY)
ALT: 18 U/L (ref 0–44)
AST: 16 U/L (ref 15–41)
Albumin: 4.3 g/dL (ref 3.5–5.0)
Alkaline Phosphatase: 45 U/L (ref 38–126)
Anion gap: 9 (ref 5–15)
BUN: 16 mg/dL (ref 8–23)
CO2: 25 mmol/L (ref 22–32)
Calcium: 9.5 mg/dL (ref 8.9–10.3)
Chloride: 107 mmol/L (ref 98–111)
Creatinine: 0.85 mg/dL (ref 0.44–1.00)
GFR, Estimated: >60 mL/min (ref >60)
Glucose, Bld: 108 mg/dL — ABNORMAL HIGH (ref 70–99)
Potassium: 3.4 mmol/L — ABNORMAL LOW (ref 3.5–5.1)
Sodium: 141 mmol/L (ref 135–145)
Total Bilirubin: 0.4 mg/dL (ref 0.3–1.2)
Total Protein: 6.2 g/dL — ABNORMAL LOW (ref 6.5–8.1)

## 2021-10-19 LAB — SAMPLE TO BLOOD BANK

## 2021-10-19 LAB — RETICULOCYTES
Immature Retic Fract: 14.6 % (ref 2.3–15.9)
RBC.: 3.29 MIL/uL — ABNORMAL LOW (ref 3.87–5.11)
Retic Count, Absolute: 55.6 10*3/uL (ref 19.0–186.0)
Retic Ct Pct: 1.7 % (ref 0.4–3.1)

## 2021-10-19 LAB — LACTATE DEHYDROGENASE: LDH: 211 U/L — ABNORMAL HIGH (ref 98–192)

## 2021-10-19 MED ORDER — DENOSUMAB 120 MG/1.7ML ~~LOC~~ SOLN
120.0000 mg | Freq: Once | SUBCUTANEOUS | Status: AC
  Administered 2021-10-19: 120 mg via SUBCUTANEOUS
  Filled 2021-10-19: qty 1.7

## 2021-10-19 NOTE — Progress Notes (Signed)
Hematology and Oncology Follow Up Visit  Emily Greer [id number] 08/04/1950 71 y.o. 10/19/2021   Principle Diagnosis:  Recurrent lambda light chain myeloma - nl cytogenetics History of recurrent endometrial carcinoma Iron deficiency anemia -blood loss   Past Therapy:             Status post second autologous stem cell transplant on 07/24/2014 Maintenance therapy with Pomalidomide/every 2 week Velcade - d/c'ed Radiation therapy for endometrial recurrence - completed 04/20/2015 Pomalyst/Kyprolis 70mg/m2 IV q 2 weeks - s/p cycle #12 - held on 12/26/2017 for hematuria Daratumumab/Pomalyst (1 mg) - status post cycle 19 -- d/c on 08/21/2019 Melflufen 40 mg IV q 4 weeks -- started on 08/27/2019, s/p cycle #2 --  D/c due to FDA removal Selinexor 60 mg po q week -- start on 01/19/2020 -- changed on 03/18/2020 -- d/c on 04/27/2020   Current Therapy:        Blenrep 2.5 mg/m2 IV q 3 weeks -- started on 05/20/2020, s/p cycle #7 --D/C secondary to progression on 12/22/2020 Carfilzomib/Sarclisa -- s/p cycle #3 -- start on 01/03/2021 Femara 2.5 mg po q day  IV iron-Feraheme given on 07/15/2021. Cytoxan/Pomalyst/decadron -- start cycle #1 on 06/15/2021 - d/c 06/2021 S/p Cilta-cel CAR-T infusion -- 07/22/2021 Xgeva 120 mg IM q 3 months -- next dose 12/2021 Aranesp 300 mg sq q 4 weeks for Hgb < 11   Interim History:  Emily Greer is here today for follow-up.  She is feeling quite well.  She had the CAR-T therapy about 3 months ago.  She has done incredibly well with that.  The  last saw her, her lambda light chain was less than 0.1 mg/dL.  She has back down to [facility] on October 1 for another visit.  I suppose she probably will have a bone marrow test done.  She still has problems with anemia.  Her erythropoietin level is only 26.  As such, I think we should try her on Aranesp.  Her last iron studies were back in June which showed a ferritin of 1300 with an iron saturation of 8%.   We did go and give her some IV iron at that time.  She has had no change in bowel or bladder habits.  She is doing well on the Femara for the history endometrial cancer.  She has had no cough or shortness of breath.  There is been no bony pain.  She has had no leg swelling.  She has had no rashes.  Overall, I would say her performance status is probably ECOG 0.     Medications:  Allergies as of 10/19/2021       Reactions   Codeine Nausea Only        Medication List        Accurate as of October 19, 2021 12:17 PM. If you have any questions, ask your nurse or doctor.          STOP taking these medications    Pomalyst 2 MG capsule Generic drug: pomalidomide Stopped by: Danyla Wattley R Aedon Deason, MD       TAKE these medications    acyclovir 400 MG tablet Commonly known as: ZOVIRAX Take 1 tablet (400 mg total) by mouth 2 (two) times daily.   acyclovir ointment 5 % Commonly known as: Zovirax Apply thin layer to affected area two to three times daily prn   albuterol 108 (90 Base) MCG/ACT inhaler Commonly known as: VENTOLIN HFA Inhale 2 puffs into the lungs every   Hematology and Oncology Follow Up Visit  Emily Greer [id number] 11/03/1950 71 y.o. 10/19/2021   Principle Diagnosis:  Recurrent lambda light chain myeloma - nl cytogenetics History of recurrent endometrial carcinoma Iron deficiency anemia -blood loss   Past Therapy:             Status post second autologous stem cell transplant on 07/24/2014 Maintenance therapy with Pomalidomide/every 2 week Velcade - d/c'ed Radiation therapy for endometrial recurrence - completed 04/20/2015 Pomalyst/Kyprolis 70mg/m2 IV q 2 weeks - s/p cycle #12 - held on 12/26/2017 for hematuria Daratumumab/Pomalyst (1 mg) - status post cycle 19 -- d/c on 08/21/2019 Melflufen 40 mg IV q 4 weeks -- started on 08/27/2019, s/p cycle #2 --  D/c due to FDA removal Selinexor 60 mg po q week -- start on 01/19/2020 -- changed on 03/18/2020 -- d/c on 04/27/2020   Current Therapy:        Blenrep 2.5 mg/m2 IV q 3 weeks -- started on 05/20/2020, s/p cycle #7 --D/C secondary to progression on 12/22/2020 Carfilzomib/Sarclisa -- s/p cycle #3 -- start on 01/03/2021 Femara 2.5 mg po q day  IV iron-Feraheme given on 07/15/2021. Cytoxan/Pomalyst/decadron -- start cycle #1 on 06/15/2021 - d/c 06/2021 S/p Cilta-cel CAR-T infusion -- 07/22/2021 Xgeva 120 mg IM q 3 months -- next dose 12/2021 Aranesp 300 mg sq q 4 weeks for Hgb < 11   Interim History:  Emily Greer is here today for follow-up.  She is feeling quite well.  She had the CAR-T therapy about 3 months ago.  She has done incredibly well with that.  The  last saw her, her lambda light chain was less than 0.1 mg/dL.  She has back down to [facility] on October 1 for another visit.  I suppose she probably will have a bone marrow test done.  She still has problems with anemia.  Her erythropoietin level is only 26.  As such, I think we should try her on Aranesp.  Her last iron studies were back in June which showed a ferritin of 1300 with an iron saturation of 8%.   We did go and give her some IV iron at that time.  She has had no change in bowel or bladder habits.  She is doing well on the Femara for the history endometrial cancer.  She has had no cough or shortness of breath.  There is been no bony pain.  She has had no leg swelling.  She has had no rashes.  Overall, I would say her performance status is probably ECOG 0.     Medications:  Allergies as of 10/19/2021       Reactions   Codeine Nausea Only        Medication List        Accurate as of October 19, 2021 12:17 PM. If you have any questions, ask your nurse or doctor.          STOP taking these medications    Pomalyst 2 MG capsule Generic drug: pomalidomide Stopped by: Tanner Vigna R Kimmerly Lora, MD       TAKE these medications    acyclovir 400 MG tablet Commonly known as: ZOVIRAX Take 1 tablet (400 mg total) by mouth 2 (two) times daily.   acyclovir ointment 5 % Commonly known as: Zovirax Apply thin layer to affected area two to three times daily prn   albuterol 108 (90 Base) MCG/ACT inhaler Commonly known as: VENTOLIN HFA Inhale 2 puffs into the lungs every   Hematology and Oncology Follow Up Visit  Emily Greer [id number] 08/04/1950 71 y.o. 10/19/2021   Principle Diagnosis:  Recurrent lambda light chain myeloma - nl cytogenetics History of recurrent endometrial carcinoma Iron deficiency anemia -blood loss   Past Therapy:             Status post second autologous stem cell transplant on 07/24/2014 Maintenance therapy with Pomalidomide/every 2 week Velcade - d/c'ed Radiation therapy for endometrial recurrence - completed 04/20/2015 Pomalyst/Kyprolis 70mg/m2 IV q 2 weeks - s/p cycle #12 - held on 12/26/2017 for hematuria Daratumumab/Pomalyst (1 mg) - status post cycle 19 -- d/c on 08/21/2019 Melflufen 40 mg IV q 4 weeks -- started on 08/27/2019, s/p cycle #2 --  D/c due to FDA removal Selinexor 60 mg po q week -- start on 01/19/2020 -- changed on 03/18/2020 -- d/c on 04/27/2020   Current Therapy:        Blenrep 2.5 mg/m2 IV q 3 weeks -- started on 05/20/2020, s/p cycle #7 --D/C secondary to progression on 12/22/2020 Carfilzomib/Sarclisa -- s/p cycle #3 -- start on 01/03/2021 Femara 2.5 mg po q day  IV iron-Feraheme given on 07/15/2021. Cytoxan/Pomalyst/decadron -- start cycle #1 on 06/15/2021 - d/c 06/2021 S/p Cilta-cel CAR-T infusion -- 07/22/2021 Xgeva 120 mg IM q 3 months -- next dose 12/2021 Aranesp 300 mg sq q 4 weeks for Hgb < 11   Interim History:  Emily Greer is here today for follow-up.  She is feeling quite well.  She had the CAR-T therapy about 3 months ago.  She has done incredibly well with that.  The  last saw her, her lambda light chain was less than 0.1 mg/dL.  She has back down to [facility] on October 1 for another visit.  I suppose she probably will have a bone marrow test done.  She still has problems with anemia.  Her erythropoietin level is only 26.  As such, I think we should try her on Aranesp.  Her last iron studies were back in June which showed a ferritin of 1300 with an iron saturation of 8%.   We did go and give her some IV iron at that time.  She has had no change in bowel or bladder habits.  She is doing well on the Femara for the history endometrial cancer.  She has had no cough or shortness of breath.  There is been no bony pain.  She has had no leg swelling.  She has had no rashes.  Overall, I would say her performance status is probably ECOG 0.     Medications:  Allergies as of 10/19/2021       Reactions   Codeine Nausea Only        Medication List        Accurate as of October 19, 2021 12:17 PM. If you have any questions, ask your nurse or doctor.          STOP taking these medications    Pomalyst 2 MG capsule Generic drug: pomalidomide Stopped by: Danyla Wattley R Aedon Deason, MD       TAKE these medications    acyclovir 400 MG tablet Commonly known as: ZOVIRAX Take 1 tablet (400 mg total) by mouth 2 (two) times daily.   acyclovir ointment 5 % Commonly known as: Zovirax Apply thin layer to affected area two to three times daily prn   albuterol 108 (90 Base) MCG/ACT inhaler Commonly known as: VENTOLIN HFA Inhale 2 puffs into the lungs every   Hematology and Oncology Follow Up Visit  Emily Greer [id number] 08/04/1950 71 y.o. 10/19/2021   Principle Diagnosis:  Recurrent lambda light chain myeloma - nl cytogenetics History of recurrent endometrial carcinoma Iron deficiency anemia -blood loss   Past Therapy:             Status post second autologous stem cell transplant on 07/24/2014 Maintenance therapy with Pomalidomide/every 2 week Velcade - d/c'ed Radiation therapy for endometrial recurrence - completed 04/20/2015 Pomalyst/Kyprolis 70mg/m2 IV q 2 weeks - s/p cycle #12 - held on 12/26/2017 for hematuria Daratumumab/Pomalyst (1 mg) - status post cycle 19 -- d/c on 08/21/2019 Melflufen 40 mg IV q 4 weeks -- started on 08/27/2019, s/p cycle #2 --  D/c due to FDA removal Selinexor 60 mg po q week -- start on 01/19/2020 -- changed on 03/18/2020 -- d/c on 04/27/2020   Current Therapy:        Blenrep 2.5 mg/m2 IV q 3 weeks -- started on 05/20/2020, s/p cycle #7 --D/C secondary to progression on 12/22/2020 Carfilzomib/Sarclisa -- s/p cycle #3 -- start on 01/03/2021 Femara 2.5 mg po q day  IV iron-Feraheme given on 07/15/2021. Cytoxan/Pomalyst/decadron -- start cycle #1 on 06/15/2021 - d/c 06/2021 S/p Cilta-cel CAR-T infusion -- 07/22/2021 Xgeva 120 mg IM q 3 months -- next dose 12/2021 Aranesp 300 mg sq q 4 weeks for Hgb < 11   Interim History:  Emily Greer is here today for follow-up.  She is feeling quite well.  She had the CAR-T therapy about 3 months ago.  She has done incredibly well with that.  The  last saw her, her lambda light chain was less than 0.1 mg/dL.  She has back down to [facility] on October 1 for another visit.  I suppose she probably will have a bone marrow test done.  She still has problems with anemia.  Her erythropoietin level is only 26.  As such, I think we should try her on Aranesp.  Her last iron studies were back in June which showed a ferritin of 1300 with an iron saturation of 8%.   We did go and give her some IV iron at that time.  She has had no change in bowel or bladder habits.  She is doing well on the Femara for the history endometrial cancer.  She has had no cough or shortness of breath.  There is been no bony pain.  She has had no leg swelling.  She has had no rashes.  Overall, I would say her performance status is probably ECOG 0.     Medications:  Allergies as of 10/19/2021       Reactions   Codeine Nausea Only        Medication List        Accurate as of October 19, 2021 12:17 PM. If you have any questions, ask your nurse or doctor.          STOP taking these medications    Pomalyst 2 MG capsule Generic drug: pomalidomide Stopped by: Danyla Wattley R Aedon Deason, MD       TAKE these medications    acyclovir 400 MG tablet Commonly known as: ZOVIRAX Take 1 tablet (400 mg total) by mouth 2 (two) times daily.   acyclovir ointment 5 % Commonly known as: Zovirax Apply thin layer to affected area two to three times daily prn   albuterol 108 (90 Base) MCG/ACT inhaler Commonly known as: VENTOLIN HFA Inhale 2 puffs into the lungs every

## 2021-10-19 NOTE — Patient Instructions (Signed)
Denosumab Injection (Oncology) What is this medication? DENOSUMAB (den oh SUE mab) prevents weakened bones caused by cancer. It may also be used to treat noncancerous bone tumors that cannot be removed by surgery. It can also be used to treat high calcium levels in the blood caused by cancer. It works by blocking a protein that causes bones to break down quickly. This slows down the release of calcium from bones, which lowers calcium levels in your blood. It also makes your bones stronger and less likely to break (fracture). This medicine may be used for other purposes; ask your health care provider or pharmacist if you have questions. COMMON BRAND NAME(S): XGEVA What should I tell my care team before I take this medication? They need to know if you have any of these conditions: Dental disease Having surgery or tooth extraction Infection Kidney disease Low levels of calcium or vitamin D in the blood Malnutrition On hemodialysis Skin conditions or sensitivity Thyroid or parathyroid disease An unusual reaction to denosumab, other medications, foods, dyes, or preservatives Pregnant or trying to get pregnant Breast-feeding How should I use this medication? This medication is for injection under the skin. It is given by your care team in a hospital or clinic setting. A special MedGuide will be given to you before each treatment. Be sure to read this information carefully each time. Talk to your care team about the use of this medication in children. While it may be prescribed for children as young as 13 years for selected conditions, precautions do apply. Overdosage: If you think you have taken too much of this medicine contact a poison control center or emergency room at once. NOTE: This medicine is only for you. Do not share this medicine with others. What if I miss a dose? Keep appointments for follow-up doses. It is important not to miss your dose. Call your care team if you are unable to  keep an appointment. What may interact with this medication? Do not take this medication with any of the following: Other medications containing denosumab This medication may also interact with the following: Medications that lower your chance of fighting infection Steroid medications, such as prednisone or cortisone This list may not describe all possible interactions. Give your health care provider a list of all the medicines, herbs, non-prescription drugs, or dietary supplements you use. Also tell them if you smoke, drink alcohol, or use illegal drugs. Some items may interact with your medicine. What should I watch for while using this medication? Your condition will be monitored carefully while you are receiving this medication. You may need blood work while taking this medication. This medication may increase your risk of getting an infection. Call your care team for advice if you get a fever, chills, sore throat, or other symptoms of a cold or flu. Do not treat yourself. Try to avoid being around people who are sick. You should make sure you get enough calcium and vitamin D while you are taking this medication, unless your care team tells you not to. Discuss the foods you eat and the vitamins you take with your care team. Some people who take this medication have severe bone, joint, or muscle pain. This medication may also increase your risk for jaw problems or a broken thigh bone. Tell your care team right away if you have severe pain in your jaw, bones, joints, or muscles. Tell your care team if you have any pain that does not go away or that gets worse. Talk   to your care team if you may be pregnant. Serious birth defects can occur if you take this medication during pregnancy and for 5 months after the last dose. You will need a negative pregnancy test before starting this medication. Contraception is recommended while taking this medication and for 5 months after the last dose. Your care team  can help you find the option that works for you. What side effects may I notice from receiving this medication? Side effects that you should report to your care team as soon as possible: Allergic reactions--skin rash, itching, hives, swelling of the face, lips, tongue, or throat Bone, joint, or muscle pain Low calcium level--muscle pain or cramps, confusion, tingling, or numbness in the hands or feet Osteonecrosis of the jaw--pain, swelling, or redness in the mouth, numbness of the jaw, poor healing after dental work, unusual discharge from the mouth, visible bones in the mouth Side effects that usually do not require medical attention (report to your care team if they continue or are bothersome): Cough Diarrhea Fatigue Headache Nausea This list may not describe all possible side effects. Call your doctor for medical advice about side effects. You may report side effects to FDA at 1-800-FDA-1088. Where should I keep my medication? This medication is given in a hospital or clinic. It will not be stored at home. NOTE: This sheet is a summary. It may not cover all possible information. If you have questions about this medicine, talk to your doctor, pharmacist, or health care provider.  2023 Elsevier/Gold Standard (2021-06-06 00:00:00)  

## 2021-10-20 ENCOUNTER — Telehealth: Payer: Self-pay | Admitting: *Deleted

## 2021-10-20 LAB — IRON AND IRON BINDING CAPACITY (CC-WL,HP ONLY)
Iron: 85 ug/dL (ref 28–170)
Saturation Ratios: 25 % (ref 10.4–31.8)
TIBC: 342 ug/dL (ref 250–450)
UIBC: 257 ug/dL (ref 148–442)

## 2021-10-20 LAB — IGG, IGA, IGM
IgA: <5 mg/dL — ABNORMAL LOW (ref 87–352)
IgG (Immunoglobin G), Serum: 200 mg/dL — ABNORMAL LOW (ref 586–1602)
IgM (Immunoglobulin M), Srm: <5 mg/dL — ABNORMAL LOW (ref 26–217)

## 2021-10-20 LAB — FERRITIN: Ferritin: 1435 ng/mL — ABNORMAL HIGH (ref 11–307)

## 2021-10-20 LAB — KAPPA/LAMBDA LIGHT CHAINS
Kappa free light chain: <0.7 mg/L — ABNORMAL LOW (ref 3.3–19.4)
Kappa, lambda light chain ratio: UNDETERMINED
Lambda free light chains: <1.5 mg/L — ABNORMAL LOW (ref 5.7–26.3)

## 2021-10-20 NOTE — Telephone Encounter (Signed)
-----   Message from [name], MD sent at 10/20/2021  2:38 PM EDT ----- Call - the iron level is better!!   [name]

## 2021-10-20 NOTE — Telephone Encounter (Signed)
Pt notified per order of Dr. Marin Olp that "the iron is better!!  Pete"  Pt is appreciative of call and has no questions or concerns at this time.

## 2021-10-21 ENCOUNTER — Encounter: Payer: Self-pay | Admitting: *Deleted

## 2021-10-24 ENCOUNTER — Other Ambulatory Visit: Payer: Self-pay | Admitting: Hematology & Oncology

## 2021-10-24 ENCOUNTER — Telehealth: Payer: Self-pay | Admitting: *Deleted

## 2021-10-24 NOTE — Telephone Encounter (Signed)
PAtient has appt for injection.  Reminded dr Marin Olp to put Retacrit careplan in for patient

## 2021-10-26 ENCOUNTER — Inpatient Hospital Stay: Admission: RE | Admit: 2021-10-26 | Payer: Medicare Other | Source: Ambulatory Visit

## 2021-10-27 ENCOUNTER — Inpatient Hospital Stay: Payer: Medicare Other

## 2021-10-27 ENCOUNTER — Ambulatory Visit (INDEPENDENT_AMBULATORY_CARE_PROVIDER_SITE_OTHER): Payer: Medicare Other | Admitting: Internal Medicine

## 2021-10-27 ENCOUNTER — Telehealth: Payer: Self-pay

## 2021-10-27 ENCOUNTER — Other Ambulatory Visit: Payer: Self-pay | Admitting: *Deleted

## 2021-10-27 ENCOUNTER — Encounter: Payer: Self-pay | Admitting: Internal Medicine

## 2021-10-27 VITALS — BP 151/74 | HR 72 | Temp 98.8°F | Resp 17

## 2021-10-27 VITALS — BP 132/90 | HR 82 | Temp 98.2°F | Ht 63.0 in | Wt 178.4 lb

## 2021-10-27 DIAGNOSIS — C9001 Multiple myeloma in remission: Secondary | ICD-10-CM

## 2021-10-27 DIAGNOSIS — Z79899 Other long term (current) drug therapy: Secondary | ICD-10-CM | POA: Diagnosis not present

## 2021-10-27 DIAGNOSIS — I10 Essential (primary) hypertension: Secondary | ICD-10-CM

## 2021-10-27 DIAGNOSIS — C541 Malignant neoplasm of endometrium: Secondary | ICD-10-CM

## 2021-10-27 DIAGNOSIS — C9000 Multiple myeloma not having achieved remission: Secondary | ICD-10-CM

## 2021-10-27 DIAGNOSIS — Z79811 Long term (current) use of aromatase inhibitors: Secondary | ICD-10-CM | POA: Diagnosis not present

## 2021-10-27 DIAGNOSIS — E876 Hypokalemia: Secondary | ICD-10-CM | POA: Diagnosis not present

## 2021-10-27 DIAGNOSIS — M79604 Pain in right leg: Secondary | ICD-10-CM | POA: Diagnosis not present

## 2021-10-27 DIAGNOSIS — H538 Other visual disturbances: Secondary | ICD-10-CM | POA: Diagnosis not present

## 2021-10-27 DIAGNOSIS — Z6831 Body mass index (BMI) 31.0-31.9, adult: Secondary | ICD-10-CM

## 2021-10-27 DIAGNOSIS — Z8542 Personal history of malignant neoplasm of other parts of uterus: Secondary | ICD-10-CM | POA: Diagnosis not present

## 2021-10-27 DIAGNOSIS — E6609 Other obesity due to excess calories: Secondary | ICD-10-CM

## 2021-10-27 DIAGNOSIS — D509 Iron deficiency anemia, unspecified: Secondary | ICD-10-CM | POA: Diagnosis not present

## 2021-10-27 LAB — CBC WITH DIFFERENTIAL (CANCER CENTER ONLY)
Abs Immature Granulocytes: 0.1 10*3/uL — ABNORMAL HIGH (ref 0.00–0.07)
Band Neutrophils: 5 %
Basophils Absolute: 0 10*3/uL (ref 0.0–0.1)
Basophils Relative: 1 %
Eosinophils Absolute: 0.1 10*3/uL (ref 0.0–0.5)
Eosinophils Relative: 4 %
HCT: 31.2 % — ABNORMAL LOW (ref 36.0–46.0)
Hemoglobin: 10 g/dL — ABNORMAL LOW (ref 12.0–15.0)
Lymphocytes Relative: 15 %
Lymphs Abs: 0.3 10*3/uL — ABNORMAL LOW (ref 0.7–4.0)
MCH: 29.2 pg (ref 26.0–34.0)
MCHC: 32.1 g/dL (ref 30.0–36.0)
MCV: 91.2 fL (ref 80.0–100.0)
Metamyelocytes Relative: 3 %
Monocytes Absolute: 0.2 10*3/uL (ref 0.1–1.0)
Monocytes Relative: 11 %
Myelocytes: 3 %
Neutro Abs: 1.2 10*3/uL — ABNORMAL LOW (ref 1.7–7.7)
Neutrophils Relative %: 58 %
Platelet Count: 78 10*3/uL — ABNORMAL LOW (ref 150–400)
RBC: 3.42 MIL/uL — ABNORMAL LOW (ref 3.87–5.11)
RDW: 15.8 % — ABNORMAL HIGH (ref 11.5–15.5)
Smear Review: NORMAL
WBC Count: 1.9 10*3/uL — ABNORMAL LOW (ref 4.0–10.5)
nRBC: 0 % (ref 0.0–0.2)

## 2021-10-27 MED ORDER — EPOETIN ALFA-EPBX 40000 UNIT/ML IJ SOLN
40000.0000 [IU] | Freq: Once | INTRAMUSCULAR | Status: AC
  Administered 2021-10-27: 40000 [IU] via SUBCUTANEOUS
  Filled 2021-10-27: qty 1

## 2021-10-27 NOTE — Progress Notes (Signed)
[name],acting as a Neurosurgeon for Gwynneth Aliment, MD.,have documented all relevant documentation on the behalf of Gwynneth Aliment, MD,as directed by  Gwynneth Aliment, MD while in the presence of Gwynneth Aliment, MD.    Subjective:     Patient ID: Emily Greer , female    DOB: [birth year] , 71 y.o.   MRN: [id number]   Chief Complaint  Patient presents with   Hypertension   Leg Pain    HPI  She presents today for BP f/u. She reports compliance with meds. Patient complains of right leg pain and stiffness in the mornings. Sx started within the past month. Sx fluctuate. Usually occurs aftre being seated for a long period of time.   She states she had treatment in [facility] ([facility]).   Hypertension This is a chronic problem. The current episode started more than 1 year ago. The problem has been gradually improving since onset. The problem is controlled. Pertinent negatives include no blurred vision. Past treatments include beta blockers and diuretics. The current treatment provides moderate improvement. Compliance problems include exercise.  There is no history of pheochromocytoma or renovascular disease.  Leg Pain      Past Medical History:  Diagnosis Date   Avascular necrosis of femoral head (HCC)    bilateral per CT 07-26-2015   Endometrial carcinoma [facility]) gyn oncologist-  dr Nelly Rout ([facility])/  radiation oncologist-- dr Roselind Messier   2013 dx  FIGO Stage 1A, Grade 2 endometrioid endometrial cancer s/p TAH w/ BSO and bilateral pelvic node dissection 10-31-2011 ;  recurrence at distal vagina 04/ 2014 s/p  brachytherapy (ended 07-29-2012);  2nd recurrence 12/ 2016  vaginal apex s/p  conformational radiotherapy 03-10-2015 to 04-20-2015   Family history of adverse reaction to anesthesia    mother ponv   GERD (gastroesophageal reflux disease)    Goals of care, counseling/discussion 08/21/2019   H/O stem cell transplant (HCC)    02/ 2000 and second one 06/ 2016    History of bacteremia    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   History of chemotherapy    last chemo 12-26-17   History of radiation therapy 6/4, 6/11, 6/19, 6/25, 07/29/2012   vagina 30.5 gray in 5 fx, HDR brachytherapy:   last radiation to vagina 03-10-2015 to 04-20-2015  50.4gray   History of radiation therapy 03/10/15-04/20/15   vagina 50.4 in 28 fractions   Hypertension    Lambda light chain myeloma [facility]) oncologist-  dr Myna Hidalgo ([facility])  and  [facility] -- dr Silvestre Mesi gasparetto   dx 07/ 1999 s/p  VAD chemotherapy 11/ 1999,  purged autotransplant 03-21-1998 followed by auto stem cell transplant 03-29-1998;  recurrance w/ second autologous stem cell transplant 07-24-2014;  in Re-mission currently , chemo maintenance therapy   Osteoporosis 12/18/05   Increased  risk    PONV (postoperative nausea and vomiting)    Wears glasses      Family History  Problem Relation Age of Onset   Colon cancer Mother    Hypertension Father    Heart Problems Father      Current Outpatient Medications:    acyclovir (ZOVIRAX) 400 MG tablet, Take 1 tablet (400 mg total) by mouth 2 (two) times daily., Disp: 60 tablet, Rfl: 6   albuterol (VENTOLIN HFA) 108 (90 Base) MCG/ACT inhaler, Inhale 2 puffs into the lungs every 6 (six) hours as needed for wheezing or shortness of breath. 2 puffs 3 times daily x  5 days then every 6 hours as needed., Disp: 18 g, Rfl: 11   amLODipine (NORVASC) 10 MG tablet, TAKE 1 TABLET(10 MG) BY MOUTH EVERY MORNING, Disp: 90 tablet, Rfl: 1   aspirin EC 81 MG tablet, Take 81 mg by mouth daily., Disp: , Rfl:    Calcium-Magnesium-Vitamin D (CALCIUM 1200+D3 PO), Take 1 tablet by mouth daily., Disp: , Rfl:    cetirizine (ZYRTEC) 10 MG tablet, Take 1 tablet (10 mg total) by mouth at bedtime., Disp: 90 tablet, Rfl: 1   chlorhexidine (PERIDEX) 0.12 % solution, 10 mLs 2 (two) times daily., Disp: , Rfl:    Cholecalciferol (VITAMIN D3) 2000 UNITS TABS, Take 2 tablets by  mouth daily., Disp: , Rfl:    hydrochlorothiazide (MICROZIDE) 12.5 MG capsule, Take 12.5 mg by mouth every morning., Disp: , Rfl:    ibuprofen (ADVIL) 800 MG tablet, Take 800 mg by mouth 3 (three) times daily., Disp: , Rfl:    letrozole (FEMARA) 2.5 MG tablet, Take 1 tablet (2.5 mg total) by mouth daily., Disp: 30 tablet, Rfl: 12   levETIRAcetam (KEPPRA) 500 MG tablet, Take 500 mg by mouth 2 (two) times daily., Disp: , Rfl:    metoprolol succinate (TOPROL-XL) 50 MG 24 hr tablet, TAKE 1 TABLET(50 MG) BY MOUTH EVERY MORNING, Disp: 90 tablet, Rfl: 3   montelukast (SINGULAIR) 10 MG tablet, TAKE 1 TABLET(10 MG) BY MOUTH AT BEDTIME, Disp: 90 tablet, Rfl: 2   potassium chloride SA (KLOR-CON) 20 MEQ tablet, TAKE 1 TABLET(20 MEQ) BY MOUTH TWICE DAILY, Disp: 60 tablet, Rfl: 3   Probiotic Product (PROBIOTIC DAILY PO), Take by mouth daily., Disp: , Rfl:    triamcinolone (NASACORT) 55 MCG/ACT AERO nasal inhaler, Place 2 sprays into the nose as needed., Disp: , Rfl:    triamterene-hydrochlorothiazide (MAXZIDE-25) 37.5-25 MG tablet, 1 tablet by mouth at bedtime, Disp: 90 tablet, Rfl: 2   zinc gluconate 50 MG tablet, Take 50 mg by mouth daily., Disp: , Rfl:    Fluocinolone Acetonide Scalp 0.01 % OIL, Apply topically. (Patient not taking: Reported on 09/29/2021), Disp: , Rfl:  No current facility-administered medications for this visit.  Facility-Administered Medications Ordered in Other Visits:    0.9 %  sodium chloride infusion, , Intravenous, Once PRN, Ennever, Rose Phi, MD   albuterol (PROVENTIL) (2.5 MG/3ML) 0.083% nebulizer solution 2.5 mg, 2.5 mg, Nebulization, Once PRN, Ennever, Rose Phi, MD   sodium chloride flush (NS) 0.9 % injection 10 mL, 10 mL, Intravenous, PRN, Erenest Blank, NP, 10 mL at 12/26/17 1130   sodium chloride flush (NS) 0.9 % injection 10 mL, 10 mL, Intravenous, PRN, Josph Macho, MD, 10 mL at 04/24/19 1305   sodium chloride flush (NS) 0.9 % injection 10 mL, 10 mL, Intravenous, PRN,  Josph Macho, MD, 10 mL at 08/21/19 1233   sodium chloride flush (NS) 0.9 % injection 10 mL, 10 mL, Intracatheter, PRN, Erenest Blank, NP, 10 mL at 10/01/19 1053   sodium chloride flush (NS) 0.9 % injection 10 mL, 10 mL, Intracatheter, PRN, Erenest Blank, NP, 10 mL at 12/11/19 1414   sodium chloride flush (NS) 0.9 % injection 10 mL, 10 mL, Intravenous, PRN, Erenest Blank, NP, 10 mL at 07/06/21 1105   sodium chloride flush (NS) 0.9 % injection 10 mL, 10 mL, Intracatheter, PRN, Erenest Blank, NP, 10 mL at 07/14/21 0931   Allergies  Allergen Reactions   Codeine Nausea Only     Review of Systems  Constitutional:  Negative.   Eyes: Negative.  Negative for blurred vision.  Cardiovascular: Negative.   Gastrointestinal:  Positive for diarrhea.       Sx started after chemotherapy treatment she receives in [facility],   Musculoskeletal:  Positive for arthralgias.       She c/o right thigh pain. States she has difficulty getting up from a seated position. Denies fall/trauma. Described as a sharp, stabbing pain.    Skin: Negative.   Neurological: Negative.   Psychiatric/Behavioral: Negative.       Today's Vitals   10/27/21 1438  BP: (!) 132/90  Pulse: 82  Temp: 98.2 F (36.8 C)  Weight: 178 lb 6.4 oz (80.9 kg)  Height: 5\' 3"  (1.6 m)  PainSc: 0-No pain   Body mass index is 31.6 kg/m.  Wt Readings from Last 3 Encounters:  10/27/21 178 lb 6.4 oz (80.9 kg)  10/19/21 177 lb 9.6 oz (80.6 kg)  10/12/21 177 lb 6.4 oz (80.5 kg)    BP Readings from Last 3 Encounters:  10/27/21 (!) 132/90  10/27/21 (!) 151/74  10/19/21 (!) 140/67     Objective:  Physical Exam Vitals and nursing note reviewed.  Constitutional:      Appearance: Normal appearance.  HENT:     Head: Normocephalic and atraumatic.  Eyes:     Extraocular Movements: Extraocular movements intact.  Cardiovascular:     Rate and Rhythm: Normal rate and regular rhythm.     Heart sounds: Normal heart sounds.  Pulmonary:      Effort: Pulmonary effort is normal.     Breath sounds: Normal breath sounds.  Skin:    General: Skin is warm.  Neurological:     General: No focal deficit present.     Mental Status: She is alert.  Psychiatric:        Mood and Affect: Mood normal.        Behavior: Behavior normal.      Assessment And Plan:     1. Essential (primary) hypertension Comments: Chronic, fair control.  She will c/w amlodipine 10mg  and metoprolol for now. Importance of low sodium diet stressed to patient.  - BMP8+EGFR; Future  2. Right leg pain Comments: I would like to check labs, she prefers to have drawn at hematology appts. Encouraged to stay well hydrated.  - CK, total; Future - Magnesium; Future - ANA, IFA (with reflex); Future  3. Hypokalemia Comments: I will recheck potassium level; however, she wants to have labs drawn at Oncology appt.  - BMP8+EGFR; Future  4. Class 1 obesity due to excess calories with serious comorbidity and body mass index (BMI) of 31.0 to 31.9 in adult Comments: She is encouraged to gradually increase her daily activity level.    Patient was given opportunity to ask questions. Patient verbalized understanding of the plan and was able to repeat key elements of the plan. All questions were answered to their satisfaction.   I, Gwynneth Aliment, MD, have reviewed all documentation for this visit. The documentation on 10/27/21 for the exam, diagnosis, procedures, and orders are all accurate and complete.   IF YOU HAVE BEEN REFERRED TO A SPECIALIST, IT MAY TAKE 1-2 WEEKS TO SCHEDULE/PROCESS THE REFERRAL. IF YOU HAVE NOT HEARD FROM US/SPECIALIST IN TWO WEEKS, PLEASE GIVE [place] A CALL AT [phone] X [phone].   THE PATIENT IS ENCOURAGED TO PRACTICE SOCIAL DISTANCING DUE TO THE COVID-19 PANDEMIC.

## 2021-10-27 NOTE — Telephone Encounter (Signed)
Attempted to contact patient to reschedule her bone density exam.  Scheduled with [facility] on 04/11/2022 at 9:00am.

## 2021-10-27 NOTE — Patient Instructions (Signed)

## 2021-10-27 NOTE — Patient Instructions (Addendum)
Hypokalemia Hypokalemia means that the amount of potassium in the blood is lower than normal. Potassium is a mineral (electrolyte) that helps regulate the amount of fluid in the body. It also stimulates muscle tightening (contraction) and helps nerves work properly. Normally, most of the body's potassium is inside cells, and only a very small amount is in the blood. Because the amount in the blood is so small, minor changes to potassium levels in the blood can be life-threatening. What are the causes? This condition may be caused by: Antibiotic medicine. Diarrhea or vomiting. Taking too much of a medicine that helps you have a bowel movement (laxative) can cause diarrhea and lead to hypokalemia. Chronic kidney disease (CKD). Medicines that help the body get rid of excess fluid (diuretics). Eating disorders, such as anorexia or bulimia. Low magnesium levels in the body. Sweating a lot. What are the signs or symptoms? Symptoms of this condition include: Weakness. Constipation. Fatigue. Muscle cramps. Mental confusion. Skipped heartbeats or irregular heartbeat (palpitations). Tingling or numbness. How is this diagnosed? This condition is diagnosed with a blood test. How is this treated? This condition may be treated by: Taking potassium supplements. Adjusting the medicines that you take. Eating more foods that contain a lot of potassium. If your potassium level is very low, you may need to get potassium through an IV and be monitored in the hospital. Follow these instructions at home: Eating and drinking  Eat a healthy diet. A healthy diet includes fresh fruits and vegetables, whole grains, healthy fats, and lean proteins. If told, eat more foods that contain a lot of potassium. These include: Nuts, such as peanuts and pistachios. Seeds, such as sunflower seeds and pumpkin seeds. Peas, lentils, and lima beans. Whole grain and bran cereals and breads. Fresh fruits and vegetables,  such as apricots, avocado, bananas, cantaloupe, kiwi, oranges, tomatoes, asparagus, and potatoes. Juices, such as orange, tomato, and prune. Lean meats, including fish. Milk and milk products, such as yogurt. General instructions Take over-the-counter and prescription medicines only as told by your health care provider. This includes vitamins, natural food products, and supplements. Keep all follow-up visits. This is important. Contact a health care provider if: You have weakness that gets worse. You feel your heart pounding or racing. You vomit. You have diarrhea. You have diabetes and you have trouble keeping your blood sugar in your target range. Get help right away if: You have chest pain. You have shortness of breath. You have vomiting or diarrhea that lasts for more than 2 days. You faint. These symptoms may be an emergency. Get help right away. Call 911. Do not wait to see if the symptoms will go away. Do not drive yourself to the hospital. Summary Hypokalemia means that the amount of potassium in the blood is lower than normal. This condition is diagnosed with a blood test. Hypokalemia may be treated by taking potassium supplements, adjusting the medicines that you take, or eating more foods that are high in potassium. If your potassium level is very low, you may need to get potassium through an IV and be monitored in the hospital. This information is not intended to replace advice given to you by your health care provider. Make sure you discuss any questions you have with your health care provider. Document Revised: 09/30/2020 Document Reviewed: 09/30/2020 Elsevier Patient Education  2023 Elsevier Inc.  

## 2021-10-28 ENCOUNTER — Encounter: Payer: Self-pay | Admitting: Hematology & Oncology

## 2021-10-28 NOTE — Telephone Encounter (Signed)
Patient returned phone call to get information about bone density test being rescheduled.  Gave her appointment 04/11/2022 at 9:00am with [place].  Patient confirmed and understood.

## 2021-10-31 ENCOUNTER — Telehealth: Payer: Self-pay | Admitting: *Deleted

## 2021-10-31 DIAGNOSIS — C9001 Multiple myeloma in remission: Secondary | ICD-10-CM | POA: Diagnosis not present

## 2021-10-31 DIAGNOSIS — D801 Nonfamilial hypogammaglobulinemia: Secondary | ICD-10-CM | POA: Diagnosis not present

## 2021-10-31 DIAGNOSIS — R6889 Other general symptoms and signs: Secondary | ICD-10-CM | POA: Diagnosis not present

## 2021-10-31 DIAGNOSIS — Z9289 Personal history of other medical treatment: Secondary | ICD-10-CM | POA: Diagnosis not present

## 2021-10-31 DIAGNOSIS — C9000 Multiple myeloma not having achieved remission: Secondary | ICD-10-CM | POA: Diagnosis not present

## 2021-10-31 NOTE — Telephone Encounter (Signed)
Received a call from Dr Philipp Ovens that patient will need to get monthly IVIg.  Dr Marin Olp notified.  Will put orders in for next visit.

## 2021-11-08 ENCOUNTER — Ambulatory Visit: Payer: Self-pay

## 2021-11-08 NOTE — Patient Outreach (Signed)
Care Coordination   Follow Up Visit Note   11/08/2021 Name: ROSEMOND MARSH MRN: [id number] DOB: 24-Dec-1950  MAELI MUECK is a 71 y.o. year old female who sees [name], MD for primary care. I spoke with  Myrtie Cruise by phone today.  What matters to the patients health and wellness today?  Patient is scheduled for Cataract surgery on 11/24/21 by Dr. Marchelle Gearing.     Goals Addressed               This Visit's Progress     Patient Stated     I am scheduled for Cataract surgery (pt-stated)        Care Coordination Interventions: Evaluation of current treatment plan related to right Cataract  and patient's adherence to plan as established by provider Determined patient is scheduled for right Cataract lens extraction per Dr. Marchelle Gearing scheduled for 11/24/21 Assessed for patient understanding of her prescribed treatment plan and what to expect, patient verbalizes understanding of what to expect with the procedure and her recovery           SDOH assessments and interventions completed:  No     Care Coordination Interventions Activated:  Yes  Care Coordination Interventions:  Yes, provided   Follow up plan: Follow up call scheduled for 12/05/21 @09 :15 AM    Encounter Outcome:  Pt. Visit Completed

## 2021-11-08 NOTE — Patient Instructions (Signed)
Visit Information  Thank you for taking time to visit with me today. Please don't hesitate to contact me if I can be of assistance to you.   Following are the goals we discussed today:   Goals Addressed               This Visit's Progress     Patient Stated     I am scheduled for Cataract surgery (pt-stated)        Care Coordination Interventions: Evaluation of current treatment plan related to right Cataract  and patient's adherence to plan as established by provider Determined patient is scheduled for right Cataract lens extraction per Dr. Midge Aver scheduled for 11/24/21 Assessed for patient understanding of her prescribed treatment plan and what to expect, patient verbalizes understanding of what to expect with the procedure and her recovery           Our next appointment is by telephone on 12/05/21 at 09:15 AM  Please call the care guide team at [phone] if you need to cancel or reschedule your appointment.   If you are experiencing a Mental Health or [place] or need someone to talk to, please call 1-800-273-TALK (toll free, 24 hour hotline)  Patient verbalizes understanding of instructions and care plan provided today and agrees to view in [place]. Active MyChart status and patient understanding of how to access instructions and care plan via MyChart confirmed with patient.     Barb Merino, RN, BSN, CCM Care Management Coordinator [facility] Direct Phone: [phone]

## 2021-11-09 DIAGNOSIS — L299 Pruritus, unspecified: Secondary | ICD-10-CM | POA: Diagnosis not present

## 2021-11-09 DIAGNOSIS — L650 Telogen effluvium: Secondary | ICD-10-CM | POA: Diagnosis not present

## 2021-11-16 ENCOUNTER — Inpatient Hospital Stay (HOSPITAL_BASED_OUTPATIENT_CLINIC_OR_DEPARTMENT_OTHER): Payer: Medicare Other | Admitting: Hematology & Oncology

## 2021-11-16 ENCOUNTER — Inpatient Hospital Stay: Payer: Medicare Other | Attending: Hematology & Oncology

## 2021-11-16 ENCOUNTER — Other Ambulatory Visit: Payer: Self-pay

## 2021-11-16 ENCOUNTER — Encounter: Payer: Self-pay | Admitting: Hematology & Oncology

## 2021-11-16 ENCOUNTER — Inpatient Hospital Stay: Payer: Medicare Other

## 2021-11-16 VITALS — BP 133/76 | HR 90 | Temp 98.0°F | Resp 20 | Ht 63.0 in | Wt 175.0 lb

## 2021-11-16 VITALS — BP 135/73 | HR 71

## 2021-11-16 DIAGNOSIS — C7982 Secondary malignant neoplasm of genital organs: Secondary | ICD-10-CM

## 2021-11-16 DIAGNOSIS — D509 Iron deficiency anemia, unspecified: Secondary | ICD-10-CM | POA: Diagnosis not present

## 2021-11-16 DIAGNOSIS — C9000 Multiple myeloma not having achieved remission: Secondary | ICD-10-CM | POA: Insufficient documentation

## 2021-11-16 DIAGNOSIS — Z923 Personal history of irradiation: Secondary | ICD-10-CM | POA: Insufficient documentation

## 2021-11-16 DIAGNOSIS — Z79811 Long term (current) use of aromatase inhibitors: Secondary | ICD-10-CM | POA: Diagnosis not present

## 2021-11-16 DIAGNOSIS — Z885 Allergy status to narcotic agent status: Secondary | ICD-10-CM | POA: Diagnosis not present

## 2021-11-16 DIAGNOSIS — C9001 Multiple myeloma in remission: Secondary | ICD-10-CM

## 2021-11-16 DIAGNOSIS — D500 Iron deficiency anemia secondary to blood loss (chronic): Secondary | ICD-10-CM

## 2021-11-16 DIAGNOSIS — H538 Other visual disturbances: Secondary | ICD-10-CM | POA: Insufficient documentation

## 2021-11-16 DIAGNOSIS — Z8542 Personal history of malignant neoplasm of other parts of uterus: Secondary | ICD-10-CM | POA: Insufficient documentation

## 2021-11-16 DIAGNOSIS — C541 Malignant neoplasm of endometrium: Secondary | ICD-10-CM

## 2021-11-16 LAB — CBC WITH DIFFERENTIAL (CANCER CENTER ONLY)
Abs Immature Granulocytes: 0.1 10*3/uL — ABNORMAL HIGH (ref 0.00–0.07)
Band Neutrophils: 10 %
Basophils Absolute: 0 10*3/uL (ref 0.0–0.1)
Basophils Relative: 1 %
Eosinophils Absolute: 0 10*3/uL (ref 0.0–0.5)
Eosinophils Relative: 1 %
HCT: 31.8 % — ABNORMAL LOW (ref 36.0–46.0)
Hemoglobin: 10.2 g/dL — ABNORMAL LOW (ref 12.0–15.0)
Lymphocytes Relative: 11 %
Lymphs Abs: 0.3 10*3/uL — ABNORMAL LOW (ref 0.7–4.0)
MCH: 28.7 pg (ref 26.0–34.0)
MCHC: 32.1 g/dL (ref 30.0–36.0)
MCV: 89.6 fL (ref 80.0–100.0)
Metamyelocytes Relative: 4 %
Monocytes Absolute: 0.4 10*3/uL (ref 0.1–1.0)
Monocytes Relative: 15 %
Neutro Abs: 1.8 10*3/uL (ref 1.7–7.7)
Neutrophils Relative %: 58 %
Platelet Count: 71 10*3/uL — ABNORMAL LOW (ref 150–400)
RBC: 3.55 MIL/uL — ABNORMAL LOW (ref 3.87–5.11)
RDW: 14.9 % (ref 11.5–15.5)
WBC Count: 2.6 10*3/uL — ABNORMAL LOW (ref 4.0–10.5)
nRBC: 0 % (ref 0.0–0.2)

## 2021-11-16 LAB — CMP (CANCER CENTER ONLY)
ALT: 15 U/L (ref 0–44)
AST: 13 U/L — ABNORMAL LOW (ref 15–41)
Albumin: 4.4 g/dL (ref 3.5–5.0)
Alkaline Phosphatase: 48 U/L (ref 38–126)
Anion gap: 10 (ref 5–15)
BUN: 20 mg/dL (ref 8–23)
CO2: 27 mmol/L (ref 22–32)
Calcium: 9.9 mg/dL (ref 8.9–10.3)
Chloride: 103 mmol/L (ref 98–111)
Creatinine: 0.95 mg/dL (ref 0.44–1.00)
GFR, Estimated: >60 mL/min (ref >60)
Glucose, Bld: 103 mg/dL — ABNORMAL HIGH (ref 70–99)
Potassium: 3.3 mmol/L — ABNORMAL LOW (ref 3.5–5.1)
Sodium: 140 mmol/L (ref 135–145)
Total Bilirubin: 0.4 mg/dL (ref 0.3–1.2)
Total Protein: 6.4 g/dL — ABNORMAL LOW (ref 6.5–8.1)

## 2021-11-16 LAB — RETICULOCYTES
Immature Retic Fract: 12.2 % (ref 2.3–15.9)
RBC.: 3.56 MIL/uL — ABNORMAL LOW (ref 3.87–5.11)
Retic Count, Absolute: 42.4 10*3/uL (ref 19.0–186.0)
Retic Ct Pct: 1.2 % (ref 0.4–3.1)

## 2021-11-16 LAB — FERRITIN: Ferritin: 1491 ng/mL — ABNORMAL HIGH (ref 11–307)

## 2021-11-16 LAB — LACTATE DEHYDROGENASE: LDH: 190 U/L (ref 98–192)

## 2021-11-16 MED ORDER — ACETAMINOPHEN 325 MG PO TABS
ORAL_TABLET | ORAL | Status: AC
  Administered 2021-11-16: 325 mg
  Filled 2021-11-16: qty 1

## 2021-11-16 MED ORDER — DIPHENHYDRAMINE HCL 25 MG PO CAPS
25.0000 mg | ORAL_CAPSULE | Freq: Once | ORAL | Status: AC
  Administered 2021-11-16: 25 mg via ORAL
  Filled 2021-11-16: qty 1

## 2021-11-16 MED ORDER — IMMUNE GLOBULIN (HUMAN) 40 GM/400ML IV SOLN
40.0000 g | Freq: Once | INTRAVENOUS | Status: DC
  Filled 2021-11-16: qty 400

## 2021-11-16 MED ORDER — IMMUNE GLOBULIN (HUMAN) 10 GM/100ML IV SOLN
40.0000 g | Freq: Once | INTRAVENOUS | Status: AC
  Administered 2021-11-16: 40 g via INTRAVENOUS
  Filled 2021-11-16: qty 400

## 2021-11-16 MED ORDER — HEPARIN SOD (PORK) LOCK FLUSH 100 UNIT/ML IV SOLN
500.0000 [IU] | Freq: Once | INTRAVENOUS | Status: AC
  Administered 2021-11-16: 500 [IU] via INTRAVENOUS

## 2021-11-16 MED ORDER — EPOETIN ALFA-EPBX 40000 UNIT/ML IJ SOLN
40000.0000 [IU] | Freq: Once | INTRAMUSCULAR | Status: AC
  Administered 2021-11-16: 40000 [IU] via SUBCUTANEOUS
  Filled 2021-11-16: qty 1

## 2021-11-16 MED ORDER — SODIUM CHLORIDE 0.9% FLUSH
10.0000 mL | Freq: Once | INTRAVENOUS | Status: AC
  Administered 2021-11-16: 10 mL via INTRAVENOUS

## 2021-11-16 MED ORDER — DEXTROSE 5 % IV SOLN: INTRAVENOUS | Status: DC

## 2021-11-16 NOTE — Progress Notes (Signed)
Hematology and Oncology Follow Up Visit  Emily Greer [id number] [birth year] 71 y.o. 11/16/2021   Principle Diagnosis:  Recurrent lambda light chain myeloma - nl cytogenetics History of recurrent endometrial carcinoma Iron deficiency anemia -blood loss   Past Therapy:             Status post second autologous stem cell transplant on 07/24/2014 Maintenance therapy with Pomalidomide/every 2 week Velcade - d/c'ed Radiation therapy for endometrial recurrence - completed 04/20/2015 Pomalyst/Kyprolis 70mg /m2 IV q 2 weeks - s/p cycle #12 - held on 12/26/2017 for hematuria Daratumumab/Pomalyst (1 mg) - status post cycle 19 -- d/c on 08/21/2019 Melflufen 40 mg IV q 4 weeks -- started on 08/27/2019, s/p cycle #2 --  D/c due to FDA removal Selinexor 60 mg po q week -- start on 01/19/2020 -- changed on 03/18/2020 -- d/c on 04/27/2020   Current Therapy:        Blenrep 2.5 mg/m2 IV q 3 weeks -- started on 05/20/2020, s/p cycle #7 --D/C secondary to progression on 12/22/2020 Carfilzomib/Sarclisa -- s/p cycle #3 -- start on 01/03/2021 Femara 2.5 mg po q day  IV iron-Feraheme given on 07/15/2021. Cytoxan/Pomalyst/decadron -- start cycle #1 on 06/15/2021 - d/c 06/2021 S/p Cilta-cel CAR-T infusion -- 07/22/2021 Xgeva 120 mg IM q 3 months -- next dose 12/2021 Aranesp 300 mg sq q 4 weeks for Hgb < 11 IVIG 40g IV q month -- start on 11/16/2021   Interim History:  Emily Greer is here today for follow-up.  She has been doing quite nicely.  When we last saw her back in September, her lambda light chain was less than 0.2 mg/dL.  This is an incredible response to the  Roby therapy.  She has had no fever.  Her IgG level is on the low side at 200 mg/dL.  As such, we will go ahead and give her IVIG as a prophylactic.  She is not sure when she goes by down to [facility] to see the myeloma physicians.  It is now been almost I think 5 months that she had her CAR-T therapy.  Her hemoglobin is doing  better.  We now have her on Aranesp.  She did get some iron back in September.  She has had no problems with her eyes.  She had a cataract surgery 2 weeks ago for the left eye.  She will have cataract surgery for the right eye next week.  She has had no bleeding.  There is been no change in bowel or bladder habits.  She continues on Femara for the history of uterine cancer.  Overall, I would say performance status is probably ECOG 1.   Medications:  Allergies as of 11/16/2021       Reactions   Codeine Nausea Only        Medication List        Accurate as of November 16, 2021 12:00 PM. If you have any questions, ask your nurse or doctor.          acyclovir 400 MG tablet Commonly known as: ZOVIRAX Take 1 tablet (400 mg total) by mouth 2 (two) times daily.   albuterol 108 (90 Base) MCG/ACT inhaler Commonly known as: VENTOLIN HFA Inhale 2 puffs into the lungs every 6 (six) hours as needed for wheezing or shortness of breath. 2 puffs 3 times daily x 5 days then every 6 hours as needed.   amLODipine 10 MG tablet Commonly known as: NORVASC TAKE 1 TABLET(10 MG) BY MOUTH EVERY  MORNING   aspirin EC 81 MG tablet Take 81 mg by mouth daily.   betamethasone valerate ointment 0.1 % Commonly known as: VALISONE Apply 1 Application topically daily.   CALCIUM 1200+D3 PO Take 1 tablet by mouth daily.   cetirizine 10 MG tablet Commonly known as: ZYRTEC Take 1 tablet (10 mg total) by mouth at bedtime.   chlorhexidine 0.12 % solution Commonly known as: PERIDEX 10 mLs 2 (two) times daily.   Fluocinolone Acetonide Scalp 0.01 % Oil Apply topically.   hydrochlorothiazide 12.5 MG capsule Commonly known as: MICROZIDE Take 12.5 mg by mouth every morning.   ibuprofen 800 MG tablet Commonly known as: ADVIL Take 800 mg by mouth 3 (three) times daily.   letrozole 2.5 MG tablet Commonly known as: Femara Take 1 tablet (2.5 mg total) by mouth daily.   levETIRAcetam 500 MG  tablet Commonly known as: KEPPRA Take 500 mg by mouth 2 (two) times daily.   metoprolol succinate 50 MG 24 hr tablet Commonly known as: TOPROL-XL TAKE 1 TABLET(50 MG) BY MOUTH EVERY MORNING   montelukast 10 MG tablet Commonly known as: SINGULAIR TAKE 1 TABLET(10 MG) BY MOUTH AT BEDTIME   penicillin v potassium 500 MG tablet Commonly known as: VEETID Take 500 mg by mouth 4 (four) times daily. Prophylaxis dental procedure   potassium chloride SA 20 MEQ tablet Commonly known as: KLOR-CON M TAKE 1 TABLET(20 MEQ) BY MOUTH TWICE DAILY   PROBIOTIC DAILY PO Take by mouth daily.   triamcinolone 55 MCG/ACT Aero nasal inhaler Commonly known as: NASACORT Place 2 sprays into the nose as needed.   triamterene-hydrochlorothiazide 37.5-25 MG tablet Commonly known as: MAXZIDE-25 1 tablet by mouth at bedtime   Vitamin D3 50 MCG (2000 UT) Tabs Take 2 tablets by mouth daily.   Zinc 50 MG Tabs 1 tablet Orally Once a day for 30 day(s)   zinc gluconate 50 MG tablet Take 50 mg by mouth daily.        Allergies:  Allergies  Allergen Reactions   Codeine Nausea Only    Past Medical History, Surgical history, Social history, and Family History were reviewed and updated.  Review of Systems: Review of Systems  Constitutional: Negative.   HENT: Negative.    Eyes:  Positive for blurred vision.  Respiratory: Negative.    Cardiovascular: Negative.   Gastrointestinal: Negative.   Genitourinary: Negative.   Musculoskeletal: Negative.   Skin: Negative.   Neurological: Negative.   Endo/Heme/Allergies: Negative.   Psychiatric/Behavioral: Negative.       Physical Exam:  height is 5\' 3"  (1.6 m) and weight is 175 lb (79.4 kg). Her oral temperature is 98 F (36.7 C). Her blood pressure is 133/76 and her pulse is 90. Her respiration is 20 and oxygen saturation is 99%.   Wt Readings from Last 3 Encounters:  11/16/21 175 lb (79.4 kg)  10/27/21 178 lb 6.4 oz (80.9 kg)  10/19/21 177 lb 9.6  oz (80.6 kg)    Physical Exam Vitals reviewed.  HENT:     Head: Normocephalic and atraumatic.  Eyes:     Pupils: Pupils are equal, round, and reactive to light.  Cardiovascular:     Rate and Rhythm: Normal rate and regular rhythm.     Heart sounds: Normal heart sounds.  Pulmonary:     Effort: Pulmonary effort is normal.     Breath sounds: Normal breath sounds.  Abdominal:     General: Bowel sounds are normal.     Palpations: Abdomen  is soft.  Musculoskeletal:        General: No tenderness or deformity. Normal range of motion.     Cervical back: Normal range of motion.  Lymphadenopathy:     Cervical: No cervical adenopathy.  Skin:    General: Skin is warm and dry.     Findings: No erythema or rash.  Neurological:     Mental Status: She is alert and oriented to person, place, and time.  Psychiatric:        Behavior: Behavior normal.        Thought Content: Thought content normal.        Judgment: Judgment normal.      Lab Results  Component Value Date   WBC 1.9 (L) 10/27/2021   HGB 10.0 (L) 10/27/2021   HCT 31.2 (L) 10/27/2021   MCV 91.2 10/27/2021   PLT 78 (L) 10/27/2021   Lab Results  Component Value Date   FERRITIN 1,435 (H) 10/19/2021   IRON 85 10/19/2021   TIBC 342 10/19/2021   UIBC 257 10/19/2021   IRONPCTSAT 25 10/19/2021   Lab Results  Component Value Date   RETICCTPCT 1.2 11/16/2021   RBC 3.56 (L) 11/16/2021   Lab Results  Component Value Date   KPAFRELGTCHN <0.7 (L) 10/19/2021   LAMBDASER <1.5 (L) 10/19/2021   KAPLAMBRATIO UNABLE TO CALCULATE 10/19/2021   Lab Results  Component Value Date   IGGSERUM 200 (L) 10/19/2021   IGA <5 (L) 10/19/2021   IGMSERUM <5 (L) 10/19/2021   Lab Results  Component Value Date   TOTALPROTELP 5.7 (L) 09/29/2021   ALBUMINELP 3.7 09/29/2021   A1GS 0.2 09/29/2021   A2GS 0.7 09/29/2021   BETS 0.9 09/29/2021   BETA2SER 0.4 11/23/2014   GAMS 0.3 (L) 09/29/2021   MSPIKE Not Observed 09/29/2021   SPEI Comment  10/19/2020     Chemistry      Component Value Date/Time   NA 141 10/19/2021 1115   NA 141 01/10/2017 1115   NA 140 06/21/2016 0918   K 3.4 (L) 10/19/2021 1115   K 4.0 01/10/2017 1115   K 4.3 06/21/2016 0918   CL 107 10/19/2021 1115   CL 106 01/10/2017 1115   CO2 25 10/19/2021 1115   CO2 27 01/10/2017 1115   CO2 20 (L) 06/21/2016 0918   BUN 16 10/19/2021 1115   BUN 15 01/10/2017 1115   BUN 15.8 06/21/2016 0918   CREATININE 0.85 10/19/2021 1115   CREATININE 1.0 01/10/2017 1115   CREATININE 0.8 06/21/2016 0918      Component Value Date/Time   CALCIUM 9.5 10/19/2021 1115   CALCIUM 9.5 01/10/2017 1115   CALCIUM 9.4 06/21/2016 0918   ALKPHOS 45 10/19/2021 1115   ALKPHOS 40 01/10/2017 1115   ALKPHOS 66 06/21/2016 0918   AST 16 10/19/2021 1115   AST 17 06/21/2016 0918   ALT 18 10/19/2021 1115   ALT 19 01/10/2017 1115   ALT 37 06/21/2016 0918   BILITOT 0.4 10/19/2021 1115   BILITOT 0.32 06/21/2016 0918       Impression and Plan: Emily Greer is a very pleasant 71 yo African American female with recurrent lambda light chain myeloma.  She had her second stem cell transplant for light chain myeloma was in June 2016.   I have to believe that the CAR-T therapy is working.  I am a absolute amazed as to how low her lambda light chain levels are.  This is the indicator of disease response.  We would go  ahead and start her on the IVIG today.  She will get that monthly for right now.  As her IgG level increases, then we will spread the infusions out.  She will next get the Rivka Barbara I think in December.  We will go ahead and give her some Aranesp today.  We will see what her iron levels are.  Just happy that we were able to get her to CALL-T therapy as this really was the last line of therapy that I can think of for her.  I would like to get her back in a month or so.     Josph Macho, MD 10/18/202312:00 PM

## 2021-11-16 NOTE — Patient Instructions (Signed)

## 2021-11-16 NOTE — Patient Instructions (Signed)
[facility]  Discharge Instructions: Thank you for choosing [facility] to provide your oncology and hematology care.   If you have a lab appointment with the [facility], please go directly to the [facility] and check in at the registration area.  Wear comfortable clothing and clothing appropriate for easy access to any Portacath or PICC line.   We strive to give you quality time with your provider. You may need to reschedule your appointment if you arrive late (15 or more minutes).  Arriving late affects you and other patients whose appointments are after yours.  Also, if you miss three or more appointments without notifying the office, you may be dismissed from the clinic at the provider's discretion.      For prescription refill requests, have your pharmacy contact our office and allow 72 hours for refills to be completed.    Today you received the following chemotherapy and/or immunotherapy agents reticrit, IVIG      To help prevent nausea and vomiting after your treatment, we encourage you to take your nausea medication as directed.  BELOW ARE SYMPTOMS THAT SHOULD BE REPORTED IMMEDIATELY: *FEVER GREATER THAN 100.4 F (38 C) OR HIGHER *CHILLS OR SWEATING *NAUSEA AND VOMITING THAT IS NOT CONTROLLED WITH YOUR NAUSEA MEDICATION *UNUSUAL SHORTNESS OF BREATH *UNUSUAL BRUISING OR BLEEDING *URINARY PROBLEMS (pain or burning when urinating, or frequent urination) *BOWEL PROBLEMS (unusual diarrhea, constipation, pain near the anus) TENDERNESS IN MOUTH AND THROAT WITH OR WITHOUT PRESENCE OF ULCERS (sore throat, sores in mouth, or a toothache) UNUSUAL RASH, SWELLING OR PAIN  UNUSUAL VAGINAL DISCHARGE OR ITCHING   Items with * indicate a potential emergency and should be followed up as soon as possible or go to the Emergency Department if any problems should occur.  Please show the CHEMOTHERAPY ALERT CARD or IMMUNOTHERAPY ALERT CARD at check-in to  the Emergency Department and triage nurse. Should you have questions after your visit or need to cancel or reschedule your appointment, please contact [facility]  [phone] and follow the prompts.  Office hours are 8:00 a.m. to 4:30 p.m. Monday - Friday. Please note that voicemails left after 4:00 p.m. may not be returned until the following business day.  We are closed weekends and major holidays. You have access to a nurse at all times for urgent questions. Please call the main number to the clinic [phone] and follow the prompts.  For any non-urgent questions, you may also contact your provider using MyChart. We now offer e-Visits for anyone 6 and older to request care online for non-urgent symptoms. For details visit [email or web address].   Also download the MyChart app! Go to the app store, search "MyChart", open the app, select [facility], and log in with your MyChart username and password.  Masks are optional in the cancer centers. If you would like for your care team to wear a mask while they are taking care of you, please let them know. You may have one support person who is at least 69 years old accompany you for your appointments.

## 2021-11-17 LAB — IRON AND IRON BINDING CAPACITY (CC-WL,HP ONLY)
Iron: 81 ug/dL (ref 28–170)
Saturation Ratios: 24 % (ref 10.4–31.8)
TIBC: 342 ug/dL (ref 250–450)
UIBC: 261 ug/dL (ref 148–442)

## 2021-11-18 LAB — PROTEIN ELECTROPHORESIS, SERUM, WITH REFLEX
A/G Ratio: 1.7 (ref 0.7–1.7)
Albumin ELP: 3.6 g/dL (ref 2.9–4.4)
Alpha-1-Globulin: 0.2 g/dL (ref 0.0–0.4)
Alpha-2-Globulin: 0.9 g/dL (ref 0.4–1.0)
Beta Globulin: 0.8 g/dL (ref 0.7–1.3)
Gamma Globulin: 0.2 g/dL — ABNORMAL LOW (ref 0.4–1.8)
Globulin, Total: 2.1 g/dL — ABNORMAL LOW (ref 2.2–3.9)
Total Protein ELP: 5.7 g/dL — ABNORMAL LOW (ref 6.0–8.5)

## 2021-11-18 LAB — KAPPA/LAMBDA LIGHT CHAINS
Kappa free light chain: <0.7 mg/L — ABNORMAL LOW (ref 3.3–19.4)
Kappa, lambda light chain ratio: UNDETERMINED
Lambda free light chains: <1.5 mg/L — ABNORMAL LOW (ref 5.7–26.3)

## 2021-11-21 DIAGNOSIS — H2511 Age-related nuclear cataract, right eye: Secondary | ICD-10-CM | POA: Diagnosis not present

## 2021-11-22 LAB — IGG, IGA, IGM
IgA: <5 mg/dL — ABNORMAL LOW (ref 87–352)
IgG (Immunoglobin G), Serum: 173 mg/dL — ABNORMAL LOW (ref 586–1602)
IgM (Immunoglobulin M), Srm: <5 mg/dL — ABNORMAL LOW (ref 26–217)

## 2021-11-24 DIAGNOSIS — H2512 Age-related nuclear cataract, left eye: Secondary | ICD-10-CM | POA: Diagnosis not present

## 2021-11-24 DIAGNOSIS — H2511 Age-related nuclear cataract, right eye: Secondary | ICD-10-CM | POA: Diagnosis not present

## 2021-12-05 NOTE — Patient Outreach (Signed)
Care Coordination   Follow Up Visit Note   12/05/2021 Name: Emily Greer MRN: [id number] DOB: 20-Sep-1950  Emily Greer is a 71 y.o. year old female who sees Emily Peng, MD for primary care. I spoke with  Emily Greer by phone today.  What matters to the patients health and wellness today?  Patient is recovering from right Cataract surgery without complications.     Goals Addressed               This Visit's Progress     Patient Stated     COMPLETED: I am scheduled for Cataract surgery (pt-stated)        Care Coordination Interventions: Evaluation of current treatment plan related to right Cataract  and patient's adherence to plan as established by provider Determined patient underwent right Cataract lens extraction per Dr. Marchelle Greer completed on 11/24/21 Determined patient feels she is recovering without complications Reviewed and discussed upcoming post operative follow up with Dr. Dione Greer scheduled for 12/27/21, confirmed patient has transportation for this appointment Instructed patient to report new symptoms or concerns to her doctor promply          SDOH assessments and interventions completed:  No     Care Coordination Interventions Activated:  Yes  Care Coordination Interventions:  Yes, provided   Follow up plan: Follow up call scheduled for 03/01/22 @0930  AM    Encounter Outcome:  Pt. Visit Completed

## 2021-12-05 NOTE — Patient Instructions (Signed)
Visit Information  Thank you for taking time to visit with me today. Please don't hesitate to contact me if I can be of assistance to you.   Following are the goals we discussed today:   Goals Addressed               This Visit's Progress     Patient Stated     COMPLETED: I am scheduled for Cataract surgery (pt-stated)        Care Coordination Interventions: Evaluation of current treatment plan related to right Cataract  and patient's adherence to plan as established by provider Determined patient underwent right Cataract lens extraction per Dr. Midge Aver completed on 11/24/21 Determined patient feels she is recovering without complications Reviewed and discussed upcoming post operative follow up with Dr. Katy Fitch scheduled for 12/27/21, confirmed patient has transportation for this appointment Instructed patient to report new symptoms or concerns to her doctor promply          Our next appointment is by telephone on 03/01/22 '@0930'$  AM   Please call the care guide team at [phone] if you need to cancel or reschedule your appointment.   If you are experiencing a Mental Health or [place] or need someone to talk to, please call 1-800-273-TALK (toll free, 24 hour hotline)  Patient verbalizes understanding of instructions and care plan provided today and agrees to view in [facility]. Active MyChart status and patient understanding of how to access instructions and care plan via MyChart confirmed with patient.     Barb Merino, RN, BSN, CCM Care Management Coordinator [facility] Care Management Direct Phone: [phone]

## 2021-12-19 DIAGNOSIS — Z9289 Personal history of other medical treatment: Secondary | ICD-10-CM | POA: Diagnosis not present

## 2021-12-19 DIAGNOSIS — C9001 Multiple myeloma in remission: Secondary | ICD-10-CM | POA: Diagnosis not present

## 2021-12-19 DIAGNOSIS — R9389 Abnormal findings on diagnostic imaging of other specified body structures: Secondary | ICD-10-CM | POA: Diagnosis not present

## 2021-12-19 DIAGNOSIS — R6889 Other general symptoms and signs: Secondary | ICD-10-CM | POA: Diagnosis not present

## 2021-12-19 DIAGNOSIS — D801 Nonfamilial hypogammaglobulinemia: Secondary | ICD-10-CM | POA: Diagnosis not present

## 2021-12-19 DIAGNOSIS — C9000 Multiple myeloma not having achieved remission: Secondary | ICD-10-CM | POA: Diagnosis not present

## 2021-12-20 ENCOUNTER — Inpatient Hospital Stay: Payer: Medicare Other

## 2021-12-20 ENCOUNTER — Other Ambulatory Visit: Payer: Self-pay

## 2021-12-20 ENCOUNTER — Inpatient Hospital Stay: Payer: Medicare Other | Attending: Hematology & Oncology

## 2021-12-20 ENCOUNTER — Encounter: Payer: Self-pay | Admitting: Hematology & Oncology

## 2021-12-20 ENCOUNTER — Inpatient Hospital Stay (HOSPITAL_BASED_OUTPATIENT_CLINIC_OR_DEPARTMENT_OTHER): Payer: Medicare Other | Admitting: Hematology & Oncology

## 2021-12-20 VITALS — BP 143/82 | HR 79 | Temp 98.4°F | Resp 17 | Ht 63.0 in | Wt 176.0 lb

## 2021-12-20 DIAGNOSIS — Z923 Personal history of irradiation: Secondary | ICD-10-CM | POA: Diagnosis not present

## 2021-12-20 DIAGNOSIS — Z885 Allergy status to narcotic agent status: Secondary | ICD-10-CM | POA: Insufficient documentation

## 2021-12-20 DIAGNOSIS — Z8542 Personal history of malignant neoplasm of other parts of uterus: Secondary | ICD-10-CM | POA: Diagnosis not present

## 2021-12-20 DIAGNOSIS — C9000 Multiple myeloma not having achieved remission: Secondary | ICD-10-CM

## 2021-12-20 DIAGNOSIS — C541 Malignant neoplasm of endometrium: Secondary | ICD-10-CM

## 2021-12-20 DIAGNOSIS — D509 Iron deficiency anemia, unspecified: Secondary | ICD-10-CM | POA: Diagnosis not present

## 2021-12-20 DIAGNOSIS — M7989 Other specified soft tissue disorders: Secondary | ICD-10-CM | POA: Insufficient documentation

## 2021-12-20 DIAGNOSIS — Z79811 Long term (current) use of aromatase inhibitors: Secondary | ICD-10-CM | POA: Diagnosis not present

## 2021-12-20 DIAGNOSIS — Z9484 Stem cells transplant status: Secondary | ICD-10-CM | POA: Diagnosis not present

## 2021-12-20 DIAGNOSIS — C9001 Multiple myeloma in remission: Secondary | ICD-10-CM

## 2021-12-20 DIAGNOSIS — H538 Other visual disturbances: Secondary | ICD-10-CM | POA: Diagnosis not present

## 2021-12-20 LAB — CBC WITH DIFFERENTIAL (CANCER CENTER ONLY)
Abs Immature Granulocytes: 0.05 10*3/uL (ref 0.00–0.07)
Basophils Absolute: 0 10*3/uL (ref 0.0–0.1)
Basophils Relative: 1 %
Eosinophils Absolute: 0.1 10*3/uL (ref 0.0–0.5)
Eosinophils Relative: 4 %
HCT: 31 % — ABNORMAL LOW (ref 36.0–46.0)
Hemoglobin: 9.9 g/dL — ABNORMAL LOW (ref 12.0–15.0)
Immature Granulocytes: 2 %
Lymphocytes Relative: 17 %
Lymphs Abs: 0.4 10*3/uL — ABNORMAL LOW (ref 0.7–4.0)
MCH: 27.9 pg (ref 26.0–34.0)
MCHC: 31.9 g/dL (ref 30.0–36.0)
MCV: 87.3 fL (ref 80.0–100.0)
Monocytes Absolute: 0.3 10*3/uL (ref 0.1–1.0)
Monocytes Relative: 14 %
Neutro Abs: 1.3 10*3/uL — ABNORMAL LOW (ref 1.7–7.7)
Neutrophils Relative %: 62 %
Platelet Count: 86 10*3/uL — ABNORMAL LOW (ref 150–400)
RBC: 3.55 MIL/uL — ABNORMAL LOW (ref 3.87–5.11)
RDW: 16.1 % — ABNORMAL HIGH (ref 11.5–15.5)
WBC Count: 2.2 10*3/uL — ABNORMAL LOW (ref 4.0–10.5)
nRBC: 0 % (ref 0.0–0.2)

## 2021-12-20 LAB — CMP (CANCER CENTER ONLY)
ALT: 12 U/L (ref 0–44)
AST: 14 U/L — ABNORMAL LOW (ref 15–41)
Albumin: 4.4 g/dL (ref 3.5–5.0)
Alkaline Phosphatase: 43 U/L (ref 38–126)
Anion gap: 7 (ref 5–15)
BUN: 13 mg/dL (ref 8–23)
CO2: 28 mmol/L (ref 22–32)
Calcium: 9.2 mg/dL (ref 8.9–10.3)
Chloride: 107 mmol/L (ref 98–111)
Creatinine: 0.84 mg/dL (ref 0.44–1.00)
GFR, Estimated: >60 mL/min (ref >60)
Glucose, Bld: 114 mg/dL — ABNORMAL HIGH (ref 70–99)
Potassium: 3.3 mmol/L — ABNORMAL LOW (ref 3.5–5.1)
Sodium: 142 mmol/L (ref 135–145)
Total Bilirubin: 0.4 mg/dL (ref 0.3–1.2)
Total Protein: 6.6 g/dL (ref 6.5–8.1)

## 2021-12-20 LAB — IRON AND IRON BINDING CAPACITY (CC-WL,HP ONLY)
Iron: 91 ug/dL (ref 28–170)
Saturation Ratios: 29 % (ref 10.4–31.8)
TIBC: 314 ug/dL (ref 250–450)
UIBC: 223 ug/dL (ref 148–442)

## 2021-12-20 LAB — FERRITIN: Ferritin: 1725 ng/mL — ABNORMAL HIGH (ref 11–307)

## 2021-12-20 LAB — LACTATE DEHYDROGENASE: LDH: 210 U/L — ABNORMAL HIGH (ref 98–192)

## 2021-12-20 MED ORDER — HEPARIN SOD (PORK) LOCK FLUSH 100 UNIT/ML IV SOLN
500.0000 [IU] | Freq: Once | INTRAVENOUS | Status: AC | PRN
  Administered 2021-12-20: 500 [IU]

## 2021-12-20 MED ORDER — EPOETIN ALFA-EPBX 40000 UNIT/ML IJ SOLN
40000.0000 [IU] | Freq: Once | INTRAMUSCULAR | Status: AC
  Administered 2021-12-20: 40000 [IU] via SUBCUTANEOUS
  Filled 2021-12-20: qty 1

## 2021-12-20 MED ORDER — DIPHENHYDRAMINE HCL 25 MG PO CAPS
25.0000 mg | ORAL_CAPSULE | Freq: Once | ORAL | Status: AC
  Administered 2021-12-20: 25 mg via ORAL
  Filled 2021-12-20: qty 1

## 2021-12-20 MED ORDER — IMMUNE GLOBULIN (HUMAN) 10 GM/100ML IV SOLN
40.0000 g | Freq: Once | INTRAVENOUS | Status: AC
  Administered 2021-12-20: 40 g via INTRAVENOUS
  Filled 2021-12-20: qty 400

## 2021-12-20 MED ORDER — SODIUM CHLORIDE 0.9% FLUSH
10.0000 mL | INTRAVENOUS | Status: DC | PRN
  Administered 2021-12-20: 10 mL

## 2021-12-20 MED ORDER — ACETAMINOPHEN 325 MG PO TABS
325.0000 mg | ORAL_TABLET | Freq: Once | ORAL | Status: AC
  Administered 2021-12-20: 325 mg via ORAL
  Filled 2021-12-20: qty 1

## 2021-12-20 MED ORDER — DEXTROSE 5 % IV SOLN: Freq: Once | INTRAVENOUS | Status: AC

## 2021-12-20 NOTE — Progress Notes (Signed)
Hematology and Oncology Follow Up Visit  Emily Greer [id number] 05-31-1950 71 y.o. 12/20/2021   Principle Diagnosis:  Recurrent lambda light chain myeloma - nl cytogenetics History of recurrent endometrial carcinoma Iron deficiency anemia -blood loss   Past Therapy:             Status post second autologous stem cell transplant on 07/24/2014 Maintenance therapy with Pomalidomide/every 2 week Velcade - d/c'ed Radiation therapy for endometrial recurrence - completed 04/20/2015 Pomalyst/Kyprolis 70mg /m2 IV q 2 weeks - s/p cycle #12 - held on 12/26/2017 for hematuria Daratumumab/Pomalyst (1 mg) - status post cycle 19 -- d/c on 08/21/2019 Melflufen 40 mg IV q 4 weeks -- started on 08/27/2019, s/p cycle #2 --  D/c due to FDA removal Selinexor 60 mg po q week -- start on 01/19/2020 -- changed on 03/18/2020 -- d/c on 04/27/2020   Current Therapy:        Blenrep 2.5 mg/m2 IV q 3 weeks -- started on 05/20/2020, s/p cycle #7 --D/C secondary to progression on 12/22/2020 Carfilzomib/Sarclisa -- s/p cycle #3 -- start on 01/03/2021 Femara 2.5 mg po q day  IV iron-Feraheme given on 07/15/2021. Cytoxan/Pomalyst/decadron -- start cycle #1 on 06/15/2021 - d/c 06/2021 S/p Cilta-cel CAR-T infusion -- 07/22/2021 Xgeva 120 mg IM q 3 months -- next dose 12/2021 Aranesp 300 mg sq q 4 weeks for Hgb < 11 IVIG 40g IV q month -- start on 11/16/2021   Interim History:  Emily Greer is here today for follow-up.  Emily Greer continues to do amazingly well.  Emily Greer was seen down by Emily Greer oncologist at [facility].  Everything apparently looked quite well with SPECT of the myeloma.  It sounds like Emily Greer was in remission.  Is been 5 months now since Emily Greer had the Carvikty therapy.  Emily Greer has been doing quite well.  We give Emily Greer IVIG to help with Emily Greer low IgG level.    When we last saw Emily Greer, Emily Greer IgG level was 173 mg/dL.  Emily Greer last lambda light chain was less than 0.2 mg/dL.  Emily Greer has had no issues with infections.  Emily Greer  has had no problems with pain..  Emily Greer has had cataract surgery.  Emily Greer has done well with cataract surgery for both eyes.  Emily Greer has seen better.  Emily Greer last iron levels back in October showed a ferritin of 1500 with an iron saturation of 24%.  Emily Greer is on Aranesp.  Hopefully, we can get Emily Greer hemoglobin up a little bit better.  Currently, Emily Greer has had no change in bowel or bladder habits.  Emily Greer has had no bleeding.  Emily Greer has had no rashes.  Emily Greer has always had a little bit of chronic swelling in the legs.  Currently, I would say that Emily Greer performance status is probably ECOG 1.   Medications:  Allergies as of 12/20/2021       Reactions   Codeine Nausea Only        Medication List        Accurate as of December 20, 2021  9:58 AM. If you have any questions, ask your nurse or doctor.          acyclovir 400 MG tablet Commonly known as: ZOVIRAX Take 1 tablet (400 mg total) by mouth 2 (two) times daily.   albuterol 108 (90 Base) MCG/ACT inhaler Commonly known as: VENTOLIN HFA Inhale 2 puffs into the lungs every 6 (six) hours as needed for wheezing or shortness of breath. 2 puffs 3 times daily x 5 days  then every 6 hours as needed.   amLODipine 10 MG tablet Commonly known as: NORVASC TAKE 1 TABLET(10 MG) BY MOUTH EVERY MORNING   aspirin EC 81 MG tablet Take 81 mg by mouth daily.   betamethasone valerate ointment 0.1 % Commonly known as: VALISONE Apply 1 Application topically daily.   CALCIUM 1200+D3 PO Take 1 tablet by mouth daily.   cetirizine 10 MG tablet Commonly known as: ZYRTEC Take 1 tablet (10 mg total) by mouth at bedtime.   chlorhexidine 0.12 % solution Commonly known as: PERIDEX 10 mLs 2 (two) times daily.   Fluocinolone Acetonide Scalp 0.01 % Oil Apply topically.   hydrochlorothiazide 12.5 MG capsule Commonly known as: MICROZIDE Take 12.5 mg by mouth every morning.   ibuprofen 800 MG tablet Commonly known as: ADVIL Take 800 mg by mouth 3 (three) times  daily.   letrozole 2.5 MG tablet Commonly known as: Femara Take 1 tablet (2.5 mg total) by mouth daily.   levETIRAcetam 500 MG tablet Commonly known as: KEPPRA Take 500 mg by mouth 2 (two) times daily.   metoprolol succinate 50 MG 24 hr tablet Commonly known as: TOPROL-XL TAKE 1 TABLET(50 MG) BY MOUTH EVERY MORNING   montelukast 10 MG tablet Commonly known as: SINGULAIR TAKE 1 TABLET(10 MG) BY MOUTH AT BEDTIME   penicillin v potassium 500 MG tablet Commonly known as: VEETID Take 500 mg by mouth 4 (four) times daily. Prophylaxis dental procedure   potassium chloride SA 20 MEQ tablet Commonly known as: KLOR-CON M TAKE 1 TABLET(20 MEQ) BY MOUTH TWICE DAILY   PROBIOTIC DAILY PO Take by mouth daily.   triamcinolone 55 MCG/ACT Aero nasal inhaler Commonly known as: NASACORT Place 2 sprays into the nose as needed.   triamterene-hydrochlorothiazide 37.5-25 MG tablet Commonly known as: MAXZIDE-25 1 tablet by mouth at bedtime   Vitamin D3 50 MCG (2000 UT) Tabs Take 2 tablets by mouth daily.   Zinc 50 MG Tabs 1 tablet Orally Once a day for 30 day(s)   zinc gluconate 50 MG tablet Take 50 mg by mouth daily.   Zinc Gluconate 50 MG Caps Take 1 tablet by mouth daily.        Allergies:  Allergies  Allergen Reactions   Codeine Nausea Only    Past Medical History, Surgical history, Social history, and Family History were reviewed and updated.  Review of Systems: Review of Systems  Constitutional: Negative.   HENT: Negative.    Eyes:  Positive for blurred vision.  Respiratory: Negative.    Cardiovascular: Negative.   Gastrointestinal: Negative.   Genitourinary: Negative.   Musculoskeletal: Negative.   Skin: Negative.   Neurological: Negative.   Endo/Heme/Allergies: Negative.   Psychiatric/Behavioral: Negative.       Physical Exam:  height is 5\' 3"  (1.6 m) and weight is 176 lb (79.8 kg). Emily Greer oral temperature is 98.4 F (36.9 C). Emily Greer blood pressure is 143/82  (abnormal) and Emily Greer pulse is 79. Emily Greer respiration is 17 and oxygen saturation is 100%.   Wt Readings from Last 3 Encounters:  12/20/21 176 lb (79.8 kg)  11/16/21 175 lb (79.4 kg)  10/27/21 178 lb 6.4 oz (80.9 kg)    Physical Exam Vitals reviewed.  HENT:     Head: Normocephalic and atraumatic.  Eyes:     Pupils: Pupils are equal, round, and reactive to light.  Cardiovascular:     Rate and Rhythm: Normal rate and regular rhythm.     Heart sounds: Normal heart sounds.  Pulmonary:  Effort: Pulmonary effort is normal.     Breath sounds: Normal breath sounds.  Abdominal:     General: Bowel sounds are normal.     Palpations: Abdomen is soft.  Musculoskeletal:        General: No tenderness or deformity. Normal range of motion.     Cervical back: Normal range of motion.  Lymphadenopathy:     Cervical: No cervical adenopathy.  Skin:    General: Skin is warm and dry.     Findings: No erythema or rash.  Neurological:     Mental Status: Emily Greer is alert and oriented to person, place, and time.  Psychiatric:        Behavior: Behavior normal.        Thought Content: Thought content normal.        Judgment: Judgment normal.     Lab Results  Component Value Date   WBC 2.2 (L) 12/20/2021   HGB 9.9 (L) 12/20/2021   HCT 31.0 (L) 12/20/2021   MCV 87.3 12/20/2021   PLT 86 (L) 12/20/2021   Lab Results  Component Value Date   FERRITIN 1,491 (H) 11/16/2021   IRON 81 11/16/2021   TIBC 342 11/16/2021   UIBC 261 11/16/2021   IRONPCTSAT 24 11/16/2021   Lab Results  Component Value Date   RETICCTPCT 1.2 11/16/2021   RBC 3.55 (L) 12/20/2021   Lab Results  Component Value Date   KPAFRELGTCHN <0.7 (L) 11/16/2021   LAMBDASER <1.5 (L) 11/16/2021   KAPLAMBRATIO UNABLE TO CALCULATE 11/16/2021   Lab Results  Component Value Date   IGGSERUM 173 (L) 11/16/2021   IGA <5 (L) 11/16/2021   IGMSERUM <5 (L) 11/16/2021   Lab Results  Component Value Date   TOTALPROTELP 5.7 (L) 11/16/2021    ALBUMINELP 3.6 11/16/2021   A1GS 0.2 11/16/2021   A2GS 0.9 11/16/2021   BETS 0.8 11/16/2021   BETA2SER 0.4 11/23/2014   GAMS 0.2 (L) 11/16/2021   MSPIKE Not Observed 11/16/2021   SPEI Comment 10/19/2020     Chemistry      Component Value Date/Time   NA 142 12/20/2021 0901   NA 141 01/10/2017 1115   NA 140 06/21/2016 0918   K 3.3 (L) 12/20/2021 0901   K 4.0 01/10/2017 1115   K 4.3 06/21/2016 0918   CL 107 12/20/2021 0901   CL 106 01/10/2017 1115   CO2 28 12/20/2021 0901   CO2 27 01/10/2017 1115   CO2 20 (L) 06/21/2016 0918   BUN 13 12/20/2021 0901   BUN 15 01/10/2017 1115   BUN 15.8 06/21/2016 0918   CREATININE 0.84 12/20/2021 0901   CREATININE 1.0 01/10/2017 1115   CREATININE 0.8 06/21/2016 0918      Component Value Date/Time   CALCIUM 9.2 12/20/2021 0901   CALCIUM 9.5 01/10/2017 1115   CALCIUM 9.4 06/21/2016 0918   ALKPHOS 43 12/20/2021 0901   ALKPHOS 40 01/10/2017 1115   ALKPHOS 66 06/21/2016 0918   AST 14 (L) 12/20/2021 0901   AST 17 06/21/2016 0918   ALT 12 12/20/2021 0901   ALT 19 01/10/2017 1115   ALT 37 06/21/2016 0918   BILITOT 0.4 12/20/2021 0901   BILITOT 0.32 06/21/2016 0918       Impression and Plan: Ms. Bohls is a very pleasant 71 yo African American female with recurrent lambda light chain myeloma.  Emily Greer had Emily Greer second stem cell transplant for light chain myeloma was in June 2016.   I I am so happy that the  CAR-T therapy is working.  I am absolute amazed as to how low Emily Greer lambda light chain levels are.  This is the indicator of disease response.  We did give Emily Greer a 24-hour urine collection jug so that we can see how Emily Greer urinary excretion is of lambda light chain.  Emily Greer needs the IVIG today.  Again we do not want Emily Greer to have problem with infections.  Hopefully, we can move Emily Greer appointments out a little bit longer as the I IgG level goes up a little bit.  I know that Emily Greer will have a wonderful Thanksgiving with Emily Greer family.  All of Emily Greer family is  coming in for the Thanksgiving holiday.  We will plan to get Emily Greer back in another month.   Josph Macho, MD 11/21/20239:58 AM

## 2021-12-21 LAB — KAPPA/LAMBDA LIGHT CHAINS
Kappa free light chain: <0.7 mg/L — ABNORMAL LOW (ref 3.3–19.4)
Kappa, lambda light chain ratio: UNDETERMINED
Lambda free light chains: <1.5 mg/L — ABNORMAL LOW (ref 5.7–26.3)

## 2021-12-22 LAB — IGG, IGA, IGM
IgA: <5 mg/dL — ABNORMAL LOW (ref 87–352)
IgG (Immunoglobin G), Serum: 532 mg/dL — ABNORMAL LOW (ref 586–1602)
IgM (Immunoglobulin M), Srm: <5 mg/dL — ABNORMAL LOW (ref 26–217)

## 2021-12-26 LAB — PROTEIN ELECTROPHORESIS, SERUM, WITH REFLEX
A/G Ratio: 1.4 (ref 0.7–1.7)
Albumin ELP: 3.6 g/dL (ref 2.9–4.4)
Alpha-1-Globulin: 0.2 g/dL (ref 0.0–0.4)
Alpha-2-Globulin: 0.8 g/dL (ref 0.4–1.0)
Beta Globulin: 1 g/dL (ref 0.7–1.3)
Gamma Globulin: 0.4 g/dL (ref 0.4–1.8)
Globulin, Total: 2.5 g/dL (ref 2.2–3.9)
Total Protein ELP: 6.1 g/dL (ref 6.0–8.5)

## 2022-01-09 ENCOUNTER — Other Ambulatory Visit: Payer: Self-pay

## 2022-01-09 DIAGNOSIS — E876 Hypokalemia: Secondary | ICD-10-CM

## 2022-01-09 MED ORDER — POTASSIUM CHLORIDE CRYS ER 20 MEQ PO TBCR: 20.0000 meq | EXTENDED_RELEASE_TABLET | Freq: Two times a day (BID) | ORAL | 3 refills | Status: DC

## 2022-01-11 ENCOUNTER — Inpatient Hospital Stay: Payer: Medicare Other | Attending: Hematology & Oncology

## 2022-01-11 DIAGNOSIS — N186 End stage renal disease: Secondary | ICD-10-CM | POA: Insufficient documentation

## 2022-01-11 DIAGNOSIS — C9000 Multiple myeloma not having achieved remission: Secondary | ICD-10-CM | POA: Diagnosis not present

## 2022-01-11 DIAGNOSIS — Z923 Personal history of irradiation: Secondary | ICD-10-CM | POA: Insufficient documentation

## 2022-01-11 DIAGNOSIS — Z9484 Stem cells transplant status: Secondary | ICD-10-CM | POA: Insufficient documentation

## 2022-01-11 DIAGNOSIS — D631 Anemia in chronic kidney disease: Secondary | ICD-10-CM | POA: Insufficient documentation

## 2022-01-11 DIAGNOSIS — Z8542 Personal history of malignant neoplasm of other parts of uterus: Secondary | ICD-10-CM | POA: Diagnosis not present

## 2022-01-11 DIAGNOSIS — Z79811 Long term (current) use of aromatase inhibitors: Secondary | ICD-10-CM | POA: Diagnosis not present

## 2022-01-16 LAB — UPEP/UIFE/LIGHT CHAINS/TP, 24-HR UR
% BETA, Urine: 18.2 %
ALPHA 1 URINE: 4 %
Albumin, U: 62.7 %
Alpha 2, Urine: 8.8 %
Free Kappa Lt Chains,Ur: 0.67 mg/L — ABNORMAL LOW (ref 1.17–86.46)
Free Kappa/Lambda Ratio: >0.97 — ABNORMAL LOW (ref 1.83–14.26)
Free Lambda Lt Chains,Ur: <0.69 mg/L (ref 0.27–15.21)
GAMMA GLOBULIN URINE: 6.3 %
Total Protein, Urine-Ur/day: 154 mg/24 hr — ABNORMAL HIGH (ref 30–150)
Total Protein, Urine: 17.6 mg/dL
Total Volume: 875

## 2022-01-17 ENCOUNTER — Inpatient Hospital Stay: Payer: Medicare Other

## 2022-01-17 ENCOUNTER — Inpatient Hospital Stay: Payer: Medicare Other | Admitting: Hematology & Oncology

## 2022-01-17 ENCOUNTER — Telehealth: Payer: Self-pay

## 2022-01-17 NOTE — Telephone Encounter (Signed)
Advised via MyChart.

## 2022-01-17 NOTE — Telephone Encounter (Signed)
-----   Message from [name], MD sent at 01/16/2022  5:13 PM EST ----- Please call and let her know that the urine does not show any abnormal protein.  This is fantastic.  Have a very Merry Christmas.  [name]

## 2022-01-19 ENCOUNTER — Inpatient Hospital Stay: Payer: Medicare Other

## 2022-01-19 ENCOUNTER — Inpatient Hospital Stay (HOSPITAL_BASED_OUTPATIENT_CLINIC_OR_DEPARTMENT_OTHER): Payer: Medicare Other | Admitting: Hematology & Oncology

## 2022-01-19 ENCOUNTER — Other Ambulatory Visit: Payer: Self-pay

## 2022-01-19 ENCOUNTER — Encounter: Payer: Self-pay | Admitting: Hematology & Oncology

## 2022-01-19 VITALS — BP 192/93 | HR 68

## 2022-01-19 VITALS — BP 156/79 | HR 80 | Temp 98.1°F | Resp 19 | Ht 63.0 in | Wt 176.1 lb

## 2022-01-19 DIAGNOSIS — C9001 Multiple myeloma in remission: Secondary | ICD-10-CM

## 2022-01-19 DIAGNOSIS — C541 Malignant neoplasm of endometrium: Secondary | ICD-10-CM

## 2022-01-19 DIAGNOSIS — D631 Anemia in chronic kidney disease: Secondary | ICD-10-CM | POA: Diagnosis not present

## 2022-01-19 DIAGNOSIS — Z79811 Long term (current) use of aromatase inhibitors: Secondary | ICD-10-CM | POA: Diagnosis not present

## 2022-01-19 DIAGNOSIS — Z9484 Stem cells transplant status: Secondary | ICD-10-CM | POA: Diagnosis not present

## 2022-01-19 DIAGNOSIS — C9000 Multiple myeloma not having achieved remission: Secondary | ICD-10-CM

## 2022-01-19 DIAGNOSIS — Z8542 Personal history of malignant neoplasm of other parts of uterus: Secondary | ICD-10-CM | POA: Diagnosis not present

## 2022-01-19 DIAGNOSIS — N186 End stage renal disease: Secondary | ICD-10-CM | POA: Diagnosis not present

## 2022-01-19 LAB — CBC WITH DIFFERENTIAL (CANCER CENTER ONLY)
Abs Immature Granulocytes: 0.17 10*3/uL — ABNORMAL HIGH (ref 0.00–0.07)
Basophils Absolute: 0 10*3/uL (ref 0.0–0.1)
Basophils Relative: 1 %
Eosinophils Absolute: 0.1 10*3/uL (ref 0.0–0.5)
Eosinophils Relative: 4 %
HCT: 30 % — ABNORMAL LOW (ref 36.0–46.0)
Hemoglobin: 9.5 g/dL — ABNORMAL LOW (ref 12.0–15.0)
Immature Granulocytes: 8 %
Lymphocytes Relative: 15 %
Lymphs Abs: 0.3 10*3/uL — ABNORMAL LOW (ref 0.7–4.0)
MCH: 27.1 pg (ref 26.0–34.0)
MCHC: 31.7 g/dL (ref 30.0–36.0)
MCV: 85.7 fL (ref 80.0–100.0)
Monocytes Absolute: 0.3 10*3/uL (ref 0.1–1.0)
Monocytes Relative: 14 %
Neutro Abs: 1.4 10*3/uL — ABNORMAL LOW (ref 1.7–7.7)
Neutrophils Relative %: 58 %
Platelet Count: 95 10*3/uL — ABNORMAL LOW (ref 150–400)
RBC: 3.5 MIL/uL — ABNORMAL LOW (ref 3.87–5.11)
RDW: 17.4 % — ABNORMAL HIGH (ref 11.5–15.5)
Smear Review: NORMAL
WBC Count: 2.3 10*3/uL — ABNORMAL LOW (ref 4.0–10.5)
nRBC: 0 % (ref 0.0–0.2)

## 2022-01-19 LAB — CMP (CANCER CENTER ONLY)
ALT: 14 U/L (ref 0–44)
AST: 16 U/L (ref 15–41)
Albumin: 4.3 g/dL (ref 3.5–5.0)
Alkaline Phosphatase: 36 U/L — ABNORMAL LOW (ref 38–126)
Anion gap: 10 (ref 5–15)
BUN: 17 mg/dL (ref 8–23)
CO2: 28 mmol/L (ref 22–32)
Calcium: 9.5 mg/dL (ref 8.9–10.3)
Chloride: 104 mmol/L (ref 98–111)
Creatinine: 0.84 mg/dL (ref 0.44–1.00)
GFR, Estimated: >60 mL/min (ref >60)
Glucose, Bld: 102 mg/dL — ABNORMAL HIGH (ref 70–99)
Potassium: 3.2 mmol/L — ABNORMAL LOW (ref 3.5–5.1)
Sodium: 142 mmol/L (ref 135–145)
Total Bilirubin: 0.3 mg/dL (ref 0.3–1.2)
Total Protein: 6.7 g/dL (ref 6.5–8.1)

## 2022-01-19 LAB — IRON AND IRON BINDING CAPACITY (CC-WL,HP ONLY)
Iron: 82 ug/dL (ref 28–170)
Saturation Ratios: 25 % (ref 10.4–31.8)
TIBC: 329 ug/dL (ref 250–450)
UIBC: 247 ug/dL (ref 148–442)

## 2022-01-19 LAB — RETICULOCYTES
Immature Retic Fract: 20.9 % — ABNORMAL HIGH (ref 2.3–15.9)
RBC.: 3.49 MIL/uL — ABNORMAL LOW (ref 3.87–5.11)
Retic Count, Absolute: 52 10*3/uL (ref 19.0–186.0)
Retic Ct Pct: 1.5 % (ref 0.4–3.1)

## 2022-01-19 LAB — LACTATE DEHYDROGENASE: LDH: 213 U/L — ABNORMAL HIGH (ref 98–192)

## 2022-01-19 LAB — FERRITIN: Ferritin: 1634 ng/mL — ABNORMAL HIGH (ref 11–307)

## 2022-01-19 MED ORDER — EPOETIN ALFA-EPBX 40000 UNIT/ML IJ SOLN
40000.0000 [IU] | Freq: Once | INTRAMUSCULAR | Status: AC
  Administered 2022-01-19: 40000 [IU] via SUBCUTANEOUS
  Filled 2022-01-19: qty 1

## 2022-01-19 MED ORDER — DEXTROSE 5 % IV SOLN: Freq: Once | INTRAVENOUS | Status: AC

## 2022-01-19 MED ORDER — DIPHENHYDRAMINE HCL 25 MG PO CAPS
25.0000 mg | ORAL_CAPSULE | Freq: Once | ORAL | Status: AC
  Administered 2022-01-19: 25 mg via ORAL
  Filled 2022-01-19: qty 1

## 2022-01-19 MED ORDER — DENOSUMAB 120 MG/1.7ML ~~LOC~~ SOLN
120.0000 mg | Freq: Once | SUBCUTANEOUS | Status: AC
  Administered 2022-01-19: 120 mg via SUBCUTANEOUS
  Filled 2022-01-19: qty 1.7

## 2022-01-19 MED ORDER — HEPARIN SOD (PORK) LOCK FLUSH 100 UNIT/ML IV SOLN
500.0000 [IU] | Freq: Once | INTRAVENOUS | Status: AC | PRN
  Administered 2022-01-19: 500 [IU]

## 2022-01-19 MED ORDER — SODIUM CHLORIDE 0.9% FLUSH
10.0000 mL | INTRAVENOUS | Status: DC | PRN
  Administered 2022-01-19: 10 mL

## 2022-01-19 MED ORDER — IMMUNE GLOBULIN (HUMAN) 10 GM/100ML IV SOLN
40.0000 g | Freq: Once | INTRAVENOUS | Status: AC
  Administered 2022-01-19: 40 g via INTRAVENOUS
  Filled 2022-01-19: qty 400

## 2022-01-19 MED ORDER — ACETAMINOPHEN 325 MG PO TABS
650.0000 mg | ORAL_TABLET | Freq: Once | ORAL | Status: AC
  Administered 2022-01-19: 650 mg via ORAL
  Filled 2022-01-19: qty 2

## 2022-01-19 NOTE — Progress Notes (Signed)
She is Hematology and Oncology Follow Up Visit  Emily Greer [id number] 07-25-1950 71 y.o. 01/19/2022   Principle Diagnosis:  Recurrent lambda light chain myeloma - nl cytogenetics History of recurrent endometrial carcinoma Iron deficiency anemia -blood loss   Past Therapy:             Status post second autologous stem cell transplant on 07/24/2014 Maintenance therapy with Pomalidomide/every 2 week Velcade - d/c'ed Radiation therapy for endometrial recurrence - completed 04/20/2015 Pomalyst/Kyprolis 70mg /m2 IV q 2 weeks - s/p cycle #12 - held on 12/26/2017 for hematuria Daratumumab/Pomalyst (1 mg) - status post cycle 19 -- d/c on 08/21/2019 Melflufen 40 mg IV q 4 weeks -- started on 08/27/2019, s/p cycle #2 --  D/c due to FDA removal Selinexor 60 mg po q week -- start on 01/19/2020 -- changed on 03/18/2020 -- d/c on 04/27/2020   Current Therapy:        Blenrep 2.5 mg/m2 IV q 3 weeks -- started on 05/20/2020, s/p cycle #7 --D/C secondary to progression on 12/22/2020 Carfilzomib/Sarclisa -- s/p cycle #3 -- start on 01/03/2021 Femara 2.5 mg po q day  IV iron-Feraheme given on 07/15/2021. Cytoxan/Pomalyst/decadron -- start cycle #1 on 06/15/2021 - d/c 06/2021 S/p Cilta-cel CAR-T infusion -- 07/22/2021 Xgeva 120 mg IM q 3 months -- next dose 03/2022 Aranesp 300 mg sq q 4 weeks for Hgb < 11 IVIG 40g IV q month -- start on 11/16/2021   Interim History:  Emily Greer is here today for follow-up.  She is little bit tired.  She thinks this might be from Thanksgiving.  She has been quite busy since Thanksgiving.  We did do a 24-hour urine on her.  The 24-hour urine did not show any light chains in her urine.  I had to say that she is in remission.  So far, all of her labs have not shown any evidence of residual lambda light chain myeloma.  She has had no bony pain.  She has had no fever.  She has had no cough or shortness of breath.  Thankfully, she got through all of her eye  surgery.  Everything is looking good with her eyes.  She has had no problems with the Femara.  She takes Femara for the uterine cancer.  She has been in remission from this for many years.  She has had no rashes.  There is been no leg swelling.  She has had no bleeding.  She does get Aranesp for her anemia.  She will get Aranesp today.  Of note, her last IgG level back in November was 532 mg/dL.  Overall, I would say that her performance status is probably ECOG 1.    Medications:  Allergies as of 01/19/2022       Reactions   Codeine Nausea Only        Medication List        Accurate as of January 19, 2022  8:56 AM. If you have any questions, ask your nurse or doctor.          STOP taking these medications    Zinc 50 MG Tabs Stopped by: Josph Macho, MD       TAKE these medications    acyclovir 400 MG tablet Commonly known as: ZOVIRAX Take 1 tablet (400 mg total) by mouth 2 (two) times daily.   albuterol 108 (90 Base) MCG/ACT inhaler Commonly known as: VENTOLIN HFA Inhale 2 puffs into the lungs every 6 (six) hours as needed  for wheezing or shortness of breath. 2 puffs 3 times daily x 5 days then every 6 hours as needed.   amLODipine 10 MG tablet Commonly known as: NORVASC TAKE 1 TABLET(10 MG) BY MOUTH EVERY MORNING   aspirin EC 81 MG tablet Take 81 mg by mouth daily.   betamethasone valerate ointment 0.1 % Commonly known as: VALISONE Apply 1 Application topically daily.   CALCIUM 1200+D3 PO Take 1 tablet by mouth daily.   cetirizine 10 MG tablet Commonly known as: ZYRTEC Take 1 tablet (10 mg total) by mouth at bedtime.   chlorhexidine 0.12 % solution Commonly known as: PERIDEX 10 mLs 2 (two) times daily.   Fluocinolone Acetonide Scalp 0.01 % Oil Apply topically.   hydrochlorothiazide 12.5 MG capsule Commonly known as: MICROZIDE Take 12.5 mg by mouth every morning.   ibuprofen 800 MG tablet Commonly known as: ADVIL Take 800 mg by  mouth 3 (three) times daily.   letrozole 2.5 MG tablet Commonly known as: Femara Take 1 tablet (2.5 mg total) by mouth daily.   levETIRAcetam 500 MG tablet Commonly known as: KEPPRA Take 500 mg by mouth 2 (two) times daily.   metoprolol succinate 50 MG 24 hr tablet Commonly known as: TOPROL-XL TAKE 1 TABLET(50 MG) BY MOUTH EVERY MORNING   montelukast 10 MG tablet Commonly known as: SINGULAIR TAKE 1 TABLET(10 MG) BY MOUTH AT BEDTIME   penicillin v potassium 500 MG tablet Commonly known as: VEETID Take 500 mg by mouth 4 (four) times daily. Prophylaxis dental procedure   potassium chloride SA 20 MEQ tablet Commonly known as: KLOR-CON M Take 1 tablet (20 mEq total) by mouth 2 (two) times daily.   PROBIOTIC DAILY PO Take by mouth daily.   triamcinolone 55 MCG/ACT Aero nasal inhaler Commonly known as: NASACORT Place 2 sprays into the nose as needed.   triamterene-hydrochlorothiazide 37.5-25 MG tablet Commonly known as: MAXZIDE-25 1 tablet by mouth at bedtime   Vitamin D3 50 MCG (2000 UT) Tabs Take 2 tablets by mouth daily.   Zinc Gluconate 50 MG Caps Take 1 tablet by mouth daily. What changed: Another medication with the same name was removed. Continue taking this medication, and follow the directions you see here. Changed by: Josph Macho, MD        Allergies:  Allergies  Allergen Reactions   Codeine Nausea Only    Past Medical History, Surgical history, Social history, and Family History were reviewed and updated.  Review of Systems: Review of Systems  Constitutional: Negative.   HENT: Negative.    Eyes:  Positive for blurred vision.  Respiratory: Negative.    Cardiovascular: Negative.   Gastrointestinal: Negative.   Genitourinary: Negative.   Musculoskeletal: Negative.   Skin: Negative.   Neurological: Negative.   Endo/Heme/Allergies: Negative.   Psychiatric/Behavioral: Negative.       Physical Exam:  height is 5\' 3"  (1.6 m) and weight is 176  lb 1.9 oz (79.9 kg). Her oral temperature is 98.1 F (36.7 C). Her blood pressure is 156/79 (abnormal) and her pulse is 80. Her respiration is 19 and oxygen saturation is 100%.   Wt Readings from Last 3 Encounters:  01/19/22 176 lb 1.9 oz (79.9 kg)  12/20/21 176 lb (79.8 kg)  11/16/21 175 lb (79.4 kg)    Physical Exam Vitals reviewed.  HENT:     Head: Normocephalic and atraumatic.  Eyes:     Pupils: Pupils are equal, round, and reactive to light.  Cardiovascular:  Rate and Rhythm: Normal rate and regular rhythm.     Heart sounds: Normal heart sounds.  Pulmonary:     Effort: Pulmonary effort is normal.     Breath sounds: Normal breath sounds.  Abdominal:     General: Bowel sounds are normal.     Palpations: Abdomen is soft.  Musculoskeletal:        General: No tenderness or deformity. Normal range of motion.     Cervical back: Normal range of motion.  Lymphadenopathy:     Cervical: No cervical adenopathy.  Skin:    General: Skin is warm and dry.     Findings: No erythema or rash.  Neurological:     Mental Status: She is alert and oriented to person, place, and time.  Psychiatric:        Behavior: Behavior normal.        Thought Content: Thought content normal.        Judgment: Judgment normal.      Lab Results  Component Value Date   WBC 2.3 (L) 01/19/2022   HGB 9.5 (L) 01/19/2022   HCT 30.0 (L) 01/19/2022   MCV 85.7 01/19/2022   PLT 95 (L) 01/19/2022   Lab Results  Component Value Date   FERRITIN 1,725 (H) 12/20/2021   IRON 91 12/20/2021   TIBC 314 12/20/2021   UIBC 223 12/20/2021   IRONPCTSAT 29 12/20/2021   Lab Results  Component Value Date   RETICCTPCT 1.5 01/19/2022   RBC 3.49 (L) 01/19/2022   Lab Results  Component Value Date   KPAFRELGTCHN <0.7 (L) 12/20/2021   LAMBDASER <1.5 (L) 12/20/2021   KAPLAMBRATIO >0.97 (L) 01/11/2022   Lab Results  Component Value Date   IGGSERUM 532 (L) 12/20/2021   IGA <5 (L) 12/20/2021   IGMSERUM <5 (L)  12/20/2021   Lab Results  Component Value Date   TOTALPROTELP 6.1 12/20/2021   ALBUMINELP 3.6 12/20/2021   A1GS 0.2 12/20/2021   A2GS 0.8 12/20/2021   BETS 1.0 12/20/2021   BETA2SER 0.4 11/23/2014   GAMS 0.4 12/20/2021   MSPIKE Not Observed 12/20/2021   SPEI Comment 10/19/2020     Chemistry      Component Value Date/Time   NA 142 12/20/2021 0901   NA 141 01/10/2017 1115   NA 140 06/21/2016 0918   K 3.3 (L) 12/20/2021 0901   K 4.0 01/10/2017 1115   K 4.3 06/21/2016 0918   CL 107 12/20/2021 0901   CL 106 01/10/2017 1115   CO2 28 12/20/2021 0901   CO2 27 01/10/2017 1115   CO2 20 (L) 06/21/2016 0918   BUN 13 12/20/2021 0901   BUN 15 01/10/2017 1115   BUN 15.8 06/21/2016 0918   CREATININE 0.84 12/20/2021 0901   CREATININE 1.0 01/10/2017 1115   CREATININE 0.8 06/21/2016 0918      Component Value Date/Time   CALCIUM 9.2 12/20/2021 0901   CALCIUM 9.5 01/10/2017 1115   CALCIUM 9.4 06/21/2016 0918   ALKPHOS 43 12/20/2021 0901   ALKPHOS 40 01/10/2017 1115   ALKPHOS 66 06/21/2016 0918   AST 14 (L) 12/20/2021 0901   AST 17 06/21/2016 0918   ALT 12 12/20/2021 0901   ALT 19 01/10/2017 1115   ALT 37 06/21/2016 0918   BILITOT 0.4 12/20/2021 0901   BILITOT 0.32 06/21/2016 0918       Impression and Plan: Ms. Heidel is a very pleasant 71 yo African American female with recurrent lambda light chain myeloma.  She had her  second stem cell transplant for light chain myeloma was in June 2016.   I am so happy that the CAR-T therapy is working.  I am absolute amazed as to how low her lambda light chain levels are.  In addition, the fact that her 24-hour urine is negative is a very good indicator that she is in remission.    She needs the IVIG today.  Again we do not want her to have problem with infections.  Hopefully, we can move her appointments out a little bit longer as the  IgG level goes up a little bit.  She will get her Aranesp today.  She will also get the Xgeva  today.  Will plan to get her back in about 5 or 6 weeks now.  Josph Macho, MD 12/21/20238:56 AM

## 2022-01-19 NOTE — Progress Notes (Signed)
Acetaminophen 650 mg and diphenhydramine 25 mg premedications added to patient's IVIG orders per Dr. Antonieta Pert instructions.

## 2022-01-19 NOTE — Patient Instructions (Signed)

## 2022-01-19 NOTE — Patient Instructions (Addendum)

## 2022-01-20 LAB — KAPPA/LAMBDA LIGHT CHAINS
Kappa free light chain: <0.7 mg/L — ABNORMAL LOW (ref 3.3–19.4)
Kappa, lambda light chain ratio: UNDETERMINED
Lambda free light chains: <1.5 mg/L — ABNORMAL LOW (ref 5.7–26.3)

## 2022-01-20 LAB — IGG, IGA, IGM
IgA: <5 mg/dL — ABNORMAL LOW (ref 87–352)
IgG (Immunoglobin G), Serum: 698 mg/dL (ref 586–1602)
IgM (Immunoglobulin M), Srm: <5 mg/dL — ABNORMAL LOW (ref 26–217)

## 2022-01-25 LAB — PROTEIN ELECTROPHORESIS, SERUM, WITH REFLEX
A/G Ratio: 1.3 (ref 0.7–1.7)
Albumin ELP: 3.6 g/dL (ref 2.9–4.4)
Alpha-1-Globulin: 0.2 g/dL (ref 0.0–0.4)
Alpha-2-Globulin: 0.8 g/dL (ref 0.4–1.0)
Beta Globulin: 1 g/dL (ref 0.7–1.3)
Gamma Globulin: 0.6 g/dL (ref 0.4–1.8)
Globulin, Total: 2.7 g/dL (ref 2.2–3.9)
Total Protein ELP: 6.3 g/dL (ref 6.0–8.5)

## 2022-02-23 ENCOUNTER — Inpatient Hospital Stay (HOSPITAL_BASED_OUTPATIENT_CLINIC_OR_DEPARTMENT_OTHER): Payer: Medicare Other | Admitting: Hematology & Oncology

## 2022-02-23 ENCOUNTER — Encounter: Payer: Self-pay | Admitting: Hematology & Oncology

## 2022-02-23 ENCOUNTER — Inpatient Hospital Stay: Payer: Medicare Other

## 2022-02-23 ENCOUNTER — Inpatient Hospital Stay: Payer: Medicare Other | Attending: Hematology & Oncology

## 2022-02-23 VITALS — BP 172/81 | HR 76 | Temp 99.1°F | Resp 18 | Wt 177.0 lb

## 2022-02-23 VITALS — BP 151/81 | HR 62

## 2022-02-23 DIAGNOSIS — C9000 Multiple myeloma not having achieved remission: Secondary | ICD-10-CM | POA: Insufficient documentation

## 2022-02-23 DIAGNOSIS — R030 Elevated blood-pressure reading, without diagnosis of hypertension: Secondary | ICD-10-CM | POA: Insufficient documentation

## 2022-02-23 DIAGNOSIS — Z9484 Stem cells transplant status: Secondary | ICD-10-CM | POA: Insufficient documentation

## 2022-02-23 DIAGNOSIS — D509 Iron deficiency anemia, unspecified: Secondary | ICD-10-CM | POA: Insufficient documentation

## 2022-02-23 DIAGNOSIS — Z79899 Other long term (current) drug therapy: Secondary | ICD-10-CM | POA: Insufficient documentation

## 2022-02-23 DIAGNOSIS — Z8542 Personal history of malignant neoplasm of other parts of uterus: Secondary | ICD-10-CM | POA: Insufficient documentation

## 2022-02-23 DIAGNOSIS — Z923 Personal history of irradiation: Secondary | ICD-10-CM | POA: Insufficient documentation

## 2022-02-23 DIAGNOSIS — Z79811 Long term (current) use of aromatase inhibitors: Secondary | ICD-10-CM | POA: Insufficient documentation

## 2022-02-23 DIAGNOSIS — H538 Other visual disturbances: Secondary | ICD-10-CM | POA: Insufficient documentation

## 2022-02-23 DIAGNOSIS — C541 Malignant neoplasm of endometrium: Secondary | ICD-10-CM

## 2022-02-23 DIAGNOSIS — C9001 Multiple myeloma in remission: Secondary | ICD-10-CM

## 2022-02-23 DIAGNOSIS — Z885 Allergy status to narcotic agent status: Secondary | ICD-10-CM | POA: Insufficient documentation

## 2022-02-23 LAB — CBC WITH DIFFERENTIAL (CANCER CENTER ONLY)
Abs Immature Granulocytes: 0.14 10*3/uL — ABNORMAL HIGH (ref 0.00–0.07)
Basophils Absolute: 0 10*3/uL (ref 0.0–0.1)
Basophils Relative: 1 %
Eosinophils Absolute: 0.1 10*3/uL (ref 0.0–0.5)
Eosinophils Relative: 4 %
HCT: 30.7 % — ABNORMAL LOW (ref 36.0–46.0)
Hemoglobin: 9.8 g/dL — ABNORMAL LOW (ref 12.0–15.0)
Immature Granulocytes: 6 %
Lymphocytes Relative: 10 %
Lymphs Abs: 0.2 10*3/uL — ABNORMAL LOW (ref 0.7–4.0)
MCH: 26.8 pg (ref 26.0–34.0)
MCHC: 31.9 g/dL (ref 30.0–36.0)
MCV: 84.1 fL (ref 80.0–100.0)
Monocytes Absolute: 0.3 10*3/uL (ref 0.1–1.0)
Monocytes Relative: 14 %
Neutro Abs: 1.5 10*3/uL — ABNORMAL LOW (ref 1.7–7.7)
Neutrophils Relative %: 65 %
Platelet Count: 95 10*3/uL — ABNORMAL LOW (ref 150–400)
RBC: 3.65 MIL/uL — ABNORMAL LOW (ref 3.87–5.11)
RDW: 17.5 % — ABNORMAL HIGH (ref 11.5–15.5)
Smear Review: NORMAL
WBC Count: 2.3 10*3/uL — ABNORMAL LOW (ref 4.0–10.5)
nRBC: 0 % (ref 0.0–0.2)

## 2022-02-23 LAB — LACTATE DEHYDROGENASE: LDH: 214 U/L — ABNORMAL HIGH (ref 98–192)

## 2022-02-23 LAB — CMP (CANCER CENTER ONLY)
ALT: 16 U/L (ref 0–44)
AST: 16 U/L (ref 15–41)
Albumin: 4.3 g/dL (ref 3.5–5.0)
Alkaline Phosphatase: 43 U/L (ref 38–126)
Anion gap: 11 (ref 5–15)
BUN: 18 mg/dL (ref 8–23)
CO2: 27 mmol/L (ref 22–32)
Calcium: 10.3 mg/dL (ref 8.9–10.3)
Chloride: 104 mmol/L (ref 98–111)
Creatinine: 0.97 mg/dL (ref 0.44–1.00)
GFR, Estimated: >60 mL/min (ref >60)
Glucose, Bld: 100 mg/dL — ABNORMAL HIGH (ref 70–99)
Potassium: 3.2 mmol/L — ABNORMAL LOW (ref 3.5–5.1)
Sodium: 142 mmol/L (ref 135–145)
Total Bilirubin: 0.4 mg/dL (ref 0.3–1.2)
Total Protein: 6.8 g/dL (ref 6.5–8.1)

## 2022-02-23 LAB — RETICULOCYTES
Immature Retic Fract: 19.2 % — ABNORMAL HIGH (ref 2.3–15.9)
RBC.: 3.58 MIL/uL — ABNORMAL LOW (ref 3.87–5.11)
Retic Count, Absolute: 49.8 10*3/uL (ref 19.0–186.0)
Retic Ct Pct: 1.4 % (ref 0.4–3.1)

## 2022-02-23 LAB — IRON AND IRON BINDING CAPACITY (CC-WL,HP ONLY)
Iron: 77 ug/dL (ref 28–170)
Saturation Ratios: 25 % (ref 10.4–31.8)
TIBC: 314 ug/dL (ref 250–450)
UIBC: 237 ug/dL (ref 148–442)

## 2022-02-23 LAB — FERRITIN: Ferritin: 1897 ng/mL — ABNORMAL HIGH (ref 11–307)

## 2022-02-23 MED ORDER — SODIUM CHLORIDE 0.9% FLUSH
10.0000 mL | INTRAVENOUS | Status: DC | PRN
  Administered 2022-02-23: 10 mL

## 2022-02-23 MED ORDER — ACETAMINOPHEN 325 MG PO TABS
650.0000 mg | ORAL_TABLET | Freq: Once | ORAL | Status: AC
  Administered 2022-02-23: 650 mg via ORAL
  Filled 2022-02-23: qty 2

## 2022-02-23 MED ORDER — DIPHENHYDRAMINE HCL 25 MG PO CAPS
25.0000 mg | ORAL_CAPSULE | Freq: Once | ORAL | Status: AC
  Administered 2022-02-23: 25 mg via ORAL
  Filled 2022-02-23: qty 1

## 2022-02-23 MED ORDER — HEPARIN SOD (PORK) LOCK FLUSH 100 UNIT/ML IV SOLN
500.0000 [IU] | Freq: Once | INTRAVENOUS | Status: AC | PRN
  Administered 2022-02-23: 500 [IU]

## 2022-02-23 MED ORDER — DEXTROSE 5 % IV SOLN: INTRAVENOUS | Status: DC

## 2022-02-23 MED ORDER — IMMUNE GLOBULIN (HUMAN) 10 GM/100ML IV SOLN
40.0000 g | Freq: Once | INTRAVENOUS | Status: AC
  Administered 2022-02-23: 40 g via INTRAVENOUS
  Filled 2022-02-23: qty 400

## 2022-02-23 MED ORDER — DENOSUMAB 120 MG/1.7ML ~~LOC~~ SOLN
120.0000 mg | Freq: Once | SUBCUTANEOUS | Status: AC
  Administered 2022-02-23: 120 mg via SUBCUTANEOUS
  Filled 2022-02-23: qty 1.7

## 2022-02-23 NOTE — Patient Instructions (Signed)

## 2022-02-23 NOTE — Progress Notes (Signed)
She is Hematology and Oncology Follow Up Visit  Emily Greer [id number] Jun 01, 1950 72 y.o. 02/23/2022   Principle Diagnosis:  Recurrent lambda light chain myeloma - nl cytogenetics History of recurrent endometrial carcinoma Iron deficiency anemia -blood loss   Past Therapy:             Status post second autologous stem cell transplant on 07/24/2014 Maintenance therapy with Pomalidomide/every 2 week Velcade - d/c'ed Radiation therapy for endometrial recurrence - completed 04/20/2015 Pomalyst/Kyprolis 70mg /m2 IV q 2 weeks - s/p cycle #12 - held on 12/26/2017 for hematuria Daratumumab/Pomalyst (1 mg) - status post cycle 19 -- d/c on 08/21/2019 Melflufen 40 mg IV q 4 weeks -- started on 08/27/2019, s/p cycle #2 --  D/c due to FDA removal Selinexor 60 mg po q week -- start on 01/19/2020 -- changed on 03/18/2020 -- d/c on 04/27/2020   Current Therapy:        Blenrep 2.5 mg/m2 IV q 3 weeks -- started on 05/20/2020, s/p cycle #7 --D/C secondary to progression on 12/22/2020 Carfilzomib/Sarclisa -- s/p cycle #3 -- start on 01/03/2021 Femara 2.5 mg po q day  IV iron-Feraheme given on 07/15/2021. Cytoxan/Pomalyst/decadron -- start cycle #1 on 06/15/2021 - d/c 06/2021 S/p Cilta-cel CAR-T infusion -- 07/22/2021 Xgeva 120 mg IM q 3 months -- next dose 03/2022 Aranesp 300 mg sq q 4 weeks for Hgb < 11 IVIG 40g IV q month -- start on 11/16/2021   Interim History:  Emily Greer is here today for follow-up.  She is doing quite well.  She really has no complaints.  There may be some fatigue.  She had a very busy Christmas.  She has had no problems with respect to the CAR-T therapy.  She had this about 7 months ago.  Not sure when she goes back down to [place] for an evaluation.  She does have high blood pressure.  Because that, we really cannot use ARANESP today.  I know she is somewhat anemic.  However, I do not want her blood pressure to go higher.  She has had no problems with nausea or  vomiting.  She has had no cough or shortness of breath.  Thankfully, there is been no issues with COVID.  She does take the Femara for the history of uterine cancer.  She is doing well with this.  There is no bleeding.  She has had no issues with headache.  There is no mouth sores.  Really is not much swelling in the legs.  Her last lambda light chain was less than 0.1 mg/dL.  We do give her IVIG.  Her IgG level back in December was 698 mg/dL.  Overall, I would say her performance status is ECOG 1.   Medications:  Allergies as of 02/23/2022       Reactions   Codeine Nausea Only        Medication List        Accurate as of February 23, 2022  9:07 AM. If you have any questions, ask your nurse or doctor.          acyclovir 400 MG tablet Commonly known as: ZOVIRAX Take 1 tablet (400 mg total) by mouth 2 (two) times daily.   albuterol 108 (90 Base) MCG/ACT inhaler Commonly known as: VENTOLIN HFA Inhale 2 puffs into the lungs every 6 (six) hours as needed for wheezing or shortness of breath. 2 puffs 3 times daily x 5 days then every 6 hours as needed.   amLODipine  10 MG tablet Commonly known as: NORVASC TAKE 1 TABLET(10 MG) BY MOUTH EVERY MORNING   aspirin EC 81 MG tablet Take 81 mg by mouth daily.   betamethasone valerate ointment 0.1 % Commonly known as: VALISONE Apply 1 Application topically daily.   CALCIUM 1200+D3 PO Take 1 tablet by mouth daily.   cetirizine 10 MG tablet Commonly known as: ZYRTEC Take 1 tablet (10 mg total) by mouth at bedtime.   chlorhexidine 0.12 % solution Commonly known as: PERIDEX 10 mLs 2 (two) times daily.   Fluocinolone Acetonide Scalp 0.01 % Oil Apply topically.   hydrochlorothiazide 12.5 MG capsule Commonly known as: MICROZIDE Take 12.5 mg by mouth every morning.   ibuprofen 800 MG tablet Commonly known as: ADVIL Take 800 mg by mouth 3 (three) times daily.   letrozole 2.5 MG tablet Commonly known as: Femara Take 1  tablet (2.5 mg total) by mouth daily.   levETIRAcetam 500 MG tablet Commonly known as: KEPPRA Take 500 mg by mouth 2 (two) times daily.   metoprolol succinate 50 MG 24 hr tablet Commonly known as: TOPROL-XL TAKE 1 TABLET(50 MG) BY MOUTH EVERY MORNING   montelukast 10 MG tablet Commonly known as: SINGULAIR TAKE 1 TABLET(10 MG) BY MOUTH AT BEDTIME   penicillin v potassium 500 MG tablet Commonly known as: VEETID Take 500 mg by mouth 4 (four) times daily. Prophylaxis dental procedure   potassium chloride SA 20 MEQ tablet Commonly known as: KLOR-CON M Take 1 tablet (20 mEq total) by mouth 2 (two) times daily.   PROBIOTIC DAILY PO Take by mouth daily.   triamcinolone 55 MCG/ACT Aero nasal inhaler Commonly known as: NASACORT Place 2 sprays into the nose as needed.   triamterene-hydrochlorothiazide 37.5-25 MG tablet Commonly known as: MAXZIDE-25 1 tablet by mouth at bedtime   Vitamin D3 50 MCG (2000 UT) Tabs Take 2 tablets by mouth daily.   Zinc Gluconate 50 MG Caps Take 1 tablet by mouth daily.        Allergies:  Allergies  Allergen Reactions   Codeine Nausea Only    Past Medical History, Surgical history, Social history, and Family History were reviewed and updated.  Review of Systems: Review of Systems  Constitutional: Negative.   HENT: Negative.    Eyes:  Positive for blurred vision.  Respiratory: Negative.    Cardiovascular: Negative.   Gastrointestinal: Negative.   Genitourinary: Negative.   Musculoskeletal: Negative.   Skin: Negative.   Neurological: Negative.   Endo/Heme/Allergies: Negative.   Psychiatric/Behavioral: Negative.       Physical Exam:  weight is 177 lb (80.3 kg). Her oral temperature is 99.1 F (37.3 C). Her blood pressure is 172/81 (abnormal) and her pulse is 76. Her respiration is 18 and oxygen saturation is 100%.   Wt Readings from Last 3 Encounters:  02/23/22 177 lb (80.3 kg)  01/19/22 176 lb 1.9 oz (79.9 kg)  12/20/21 176 lb  (79.8 kg)    Physical Exam Vitals reviewed.  HENT:     Head: Normocephalic and atraumatic.  Eyes:     Pupils: Pupils are equal, round, and reactive to light.  Cardiovascular:     Rate and Rhythm: Normal rate and regular rhythm.     Heart sounds: Normal heart sounds.  Pulmonary:     Effort: Pulmonary effort is normal.     Breath sounds: Normal breath sounds.  Abdominal:     General: Bowel sounds are normal.     Palpations: Abdomen is soft.  Musculoskeletal:  General: No tenderness or deformity. Normal range of motion.     Cervical back: Normal range of motion.  Lymphadenopathy:     Cervical: No cervical adenopathy.  Skin:    General: Skin is warm and dry.     Findings: No erythema or rash.  Neurological:     Mental Status: She is alert and oriented to person, place, and time.  Psychiatric:        Behavior: Behavior normal.        Thought Content: Thought content normal.        Judgment: Judgment normal.     Lab Results  Component Value Date   WBC 2.3 (L) 02/23/2022   HGB 9.8 (L) 02/23/2022   HCT 30.7 (L) 02/23/2022   MCV 84.1 02/23/2022   PLT 95 (L) 02/23/2022   Lab Results  Component Value Date   FERRITIN 1,634 (H) 01/19/2022   IRON 82 01/19/2022   TIBC 329 01/19/2022   UIBC 247 01/19/2022   IRONPCTSAT 25 01/19/2022   Lab Results  Component Value Date   RETICCTPCT 1.4 02/23/2022   RBC 3.65 (L) 02/23/2022   RBC 3.58 (L) 02/23/2022   Lab Results  Component Value Date   KPAFRELGTCHN <0.7 (L) 01/19/2022   LAMBDASER <1.5 (L) 01/19/2022   KAPLAMBRATIO UNABLE TO CALCULATE 01/19/2022   Lab Results  Component Value Date   IGGSERUM 698 01/19/2022   IGA <5 (L) 01/19/2022   IGMSERUM <5 (L) 01/19/2022   Lab Results  Component Value Date   TOTALPROTELP 6.3 01/19/2022   ALBUMINELP 3.6 01/19/2022   A1GS 0.2 01/19/2022   A2GS 0.8 01/19/2022   BETS 1.0 01/19/2022   BETA2SER 0.4 11/23/2014   GAMS 0.6 01/19/2022   MSPIKE Not Observed 01/19/2022   SPEI  Comment 10/19/2020     Chemistry      Component Value Date/Time   NA 142 01/19/2022 0841   NA 141 01/10/2017 1115   NA 140 06/21/2016 0918   K 3.2 (L) 01/19/2022 0841   K 4.0 01/10/2017 1115   K 4.3 06/21/2016 0918   CL 104 01/19/2022 0841   CL 106 01/10/2017 1115   CO2 28 01/19/2022 0841   CO2 27 01/10/2017 1115   CO2 20 (L) 06/21/2016 0918   BUN 17 01/19/2022 0841   BUN 15 01/10/2017 1115   BUN 15.8 06/21/2016 0918   CREATININE 0.84 01/19/2022 0841   CREATININE 1.0 01/10/2017 1115   CREATININE 0.8 06/21/2016 0918      Component Value Date/Time   CALCIUM 9.5 01/19/2022 0841   CALCIUM 9.5 01/10/2017 1115   CALCIUM 9.4 06/21/2016 0918   ALKPHOS 36 (L) 01/19/2022 0841   ALKPHOS 40 01/10/2017 1115   ALKPHOS 66 06/21/2016 0918   AST 16 01/19/2022 0841   AST 17 06/21/2016 0918   ALT 14 01/19/2022 0841   ALT 19 01/10/2017 1115   ALT 37 06/21/2016 0918   BILITOT 0.3 01/19/2022 0841   BILITOT 0.32 06/21/2016 0918       Impression and Plan: Ms. Linsey is a very pleasant 72 yo African American female with recurrent lambda light chain myeloma.  She had her second stem cell transplant for light chain myeloma was in June 2016.   I am so happy that the CAR-T therapy is working.  I am absolute amazed as to how low her lambda light chain levels are.  In addition, the fact that her 24-hour urine is negative is a very good indicator that she is in remission.  She needs the IVIG today.  Again we do not want her to have problem with infections.  Hopefully, we can move her appointments out a little bit longer as the  IgG level goes up a little bit.  I suspect we will probably get her back in about 6 weeks now.  Will try to get her through February.   Josph Macho, MD 1/25/20249:07 AM

## 2022-02-23 NOTE — Patient Instructions (Signed)
Immune Globulin Injection What is this medication? IMMUNE GLOBULIN (im MUNE  GLOB yoo lin) helps to prevent or reduce the severity of certain infections in patients who are at risk. This medicine is collected from the pooled blood of many donors. It is used to treat immune system problems, thrombocytopenia, and Kawasaki syndrome. This medicine may be used for other purposes; ask your health care provider or pharmacist if you have questions. This medicine may be used for other purposes; ask your health care provider or pharmacist if you have questions. COMMON BRAND NAME(S): ASCENIV, Baygam, BIVIGAM, Carimune, Carimune NF, cutaquig, Cuvitru, Flebogamma, Flebogamma DIF, GamaSTAN, GamaSTAN S/D, Gamimune N, Gammagard, Gammagard S/D, Gammaked, Gammaplex, Gammar-P IV, Gamunex, Gamunex-C, Hizentra, Iveegam, Iveegam EN, Octagam, Panglobulin, Panglobulin NF, panzyga, Polygam S/D, Privigen, Sandoglobulin, Venoglobulin-S, Vigam, Vivaglobulin, Xembify What should I tell my care team before I take this medication? They need to know if you have any of these conditions: diabetes extremely low or no immune antibodies in the blood heart disease history of blood clots hyperprolinemia infection in the blood, sepsis kidney disease recently received or scheduled to receive a vaccination an unusual or allergic reaction to human immune globulin, albumin, maltose, sucrose, other medicines, foods, dyes, or preservatives pregnant or trying to get pregnant breast-feeding How should I use this medication? This medicine is for injection into a muscle or infusion into a vein or skin. It is usually given by a health care professional in a hospital or clinic setting. In rare cases, some brands of this medicine might be given at home. You will be taught how to give this medicine. Use exactly as directed. Take your medicine at regular intervals. Do not take your medicine more often than directed. Talk to your pediatrician  regarding the use of this medicine in children. While this drug may be prescribed for selected conditions, precautions do apply. Overdosage: If you think you have taken too much of this medicine contact a poison control center or emergency room at once. NOTE: This medicine is only for you. Do not share this medicine with others. Overdosage: If you think you have taken too much of this medicine contact a poison control center or emergency room at once. NOTE: This medicine is only for you. Do not share this medicine with others. What if I miss a dose? It is important not to miss your dose. Call your doctor or health care professional if you are unable to keep an appointment. If you give yourself the medicine and you miss a dose, take it as soon as you can. If it is almost time for your next dose, take only that dose. Do not take double or extra doses. What may interact with this medication? aspirin and aspirin-like medicines cisplatin cyclosporine medicines for infection like acyclovir, adefovir, amphotericin B, bacitracin, cidofovir, foscarnet, ganciclovir, gentamicin, pentamidine, vancomycin NSAIDS, medicines for pain and inflammation, like ibuprofen or naproxen pamidronate vaccines zoledronic acid This list may not describe all possible interactions. Give your health care provider a list of all the medicines, herbs, non-prescription drugs, or dietary supplements you use. Also tell them if you smoke, drink alcohol, or use illegal drugs. Some items may interact with your medicine. This list may not describe all possible interactions. Give your health care provider a list of all the medicines, herbs, non-prescription drugs, or dietary supplements you use. Also tell them if you smoke, drink alcohol, or use illegal drugs. Some items may interact with your medicine. What should I watch for while using this medication?   Your condition will be monitored carefully while you are receiving this  medicine. This medicine is made from pooled blood donations of many different people. It may be possible to pass an infection in this medicine. However, the donors are screened for infections and all products are tested for HIV and hepatitis. The medicine is treated to kill most or all bacteria and viruses. Talk to your doctor about the risks and benefits of this medicine. Do not have vaccinations for at least 14 days before, or until at least 3 months after receiving this medicine. What side effects may I notice from receiving this medication? Side effects that you should report to your doctor or health care professional as soon as possible: allergic reactions like skin rash, itching or hives, swelling of the face, lips, or tongue blue colored lips or skin breathing problems chest pain or tightness fever signs and symptoms of aseptic meningitis such as stiff neck; sensitivity to light; headache; drowsiness; fever; nausea; vomiting; rash signs and symptoms of a blood clot such as chest pain; shortness of breath; pain, swelling, or warmth in the leg signs and symptoms of hemolytic anemia such as fast heartbeat; tiredness; dark yellow or brown urine; or yellowing of the eyes or skin signs and symptoms of kidney injury like trouble passing urine or change in the amount of urine sudden weight gain swelling of the ankles, feet, hands Side effects that usually do not require medical attention (report to your doctor or health care professional if they continue or are bothersome): diarrhea flushing headache increased sweating joint pain muscle cramps muscle pain nausea pain, redness, or irritation at site where injected tiredness This list may not describe all possible side effects. Call your doctor for medical advice about side effects. You may report side effects to FDA at 1-800-FDA-1088. This list may not describe all possible side effects. Call your doctor for medical advice about side  effects. You may report side effects to FDA at 1-800-FDA-1088. Where should I keep my medication? Keep out of the reach of children. This drug is usually given in a hospital or clinic and will not be stored at home. In rare cases, some brands of this medicine may be given at home. If you are using this medicine at home, you will be instructed on how to store this medicine. Throw away any unused medicine after the expiration date on the label. NOTE: This sheet is a summary. It may not cover all possible information. If you have questions about this medicine, talk to your doctor, pharmacist, or health care provider.  2023 Elsevier/Gold Standard (2008-04-09 00:00:00) Denosumab Injection (Oncology) What is this medication? DENOSUMAB (den oh SUE mab) prevents weakened bones caused by cancer. It may also be used to treat noncancerous bone tumors that cannot be removed by surgery. It can also be used to treat high calcium levels in the blood caused by cancer. It works by blocking a protein that causes bones to break down quickly. This slows down the release of calcium from bones, which lowers calcium levels in your blood. It also makes your bones stronger and less likely to break (fracture). This medicine may be used for other purposes; ask your health care provider or pharmacist if you have questions. COMMON BRAND NAME(S): XGEVA What should I tell my care team before I take this medication? They need to know if you have any of these conditions: Dental disease Having surgery or tooth extraction Infection Kidney disease Low levels of calcium or vitamin D  in the blood Malnutrition On hemodialysis Skin conditions or sensitivity Thyroid or parathyroid disease An unusual reaction to denosumab, other medications, foods, dyes, or preservatives Pregnant or trying to get pregnant Breast-feeding How should I use this medication? This medication is for injection under the skin. It is given by your care  team in a hospital or clinic setting. A special MedGuide will be given to you before each treatment. Be sure to read this information carefully each time. Talk to your care team about the use of this medication in children. While it may be prescribed for children as young as 13 years for selected conditions, precautions do apply. Overdosage: If you think you have taken too much of this medicine contact a poison control center or emergency room at once. NOTE: This medicine is only for you. Do not share this medicine with others. What if I miss a dose? Keep appointments for follow-up doses. It is important not to miss your dose. Call your care team if you are unable to keep an appointment. What may interact with this medication? Do not take this medication with any of the following: Other medications containing denosumab This medication may also interact with the following: Medications that lower your chance of fighting infection Steroid medications, such as prednisone or cortisone This list may not describe all possible interactions. Give your health care provider a list of all the medicines, herbs, non-prescription drugs, or dietary supplements you use. Also tell them if you smoke, drink alcohol, or use illegal drugs. Some items may interact with your medicine. What should I watch for while using this medication? Your condition will be monitored carefully while you are receiving this medication. You may need blood work while taking this medication. This medication may increase your risk of getting an infection. Call your care team for advice if you get a fever, chills, sore throat, or other symptoms of a cold or flu. Do not treat yourself. Try to avoid being around people who are sick. You should make sure you get enough calcium and vitamin D while you are taking this medication, unless your care team tells you not to. Discuss the foods you eat and the vitamins you take with your care team. Some  people who take this medication have severe bone, joint, or muscle pain. This medication may also increase your risk for jaw problems or a broken thigh bone. Tell your care team right away if you have severe pain in your jaw, bones, joints, or muscles. Tell your care team if you have any pain that does not go away or that gets worse. Talk to your care team if you may be pregnant. Serious birth defects can occur if you take this medication during pregnancy and for 5 months after the last dose. You will need a negative pregnancy test before starting this medication. Contraception is recommended while taking this medication and for 5 months after the last dose. Your care team can help you find the option that works for you. What side effects may I notice from receiving this medication? Side effects that you should report to your care team as soon as possible: Allergic reactions--skin rash, itching, hives, swelling of the face, lips, tongue, or throat Bone, joint, or muscle pain Low calcium level--muscle pain or cramps, confusion, tingling, or numbness in the hands or feet Osteonecrosis of the jaw--pain, swelling, or redness in the mouth, numbness of the jaw, poor healing after dental work, unusual discharge from the mouth, visible bones in the  mouth Side effects that usually do not require medical attention (report to your care team if they continue or are bothersome): Cough Diarrhea Fatigue Headache Nausea This list may not describe all possible side effects. Call your doctor for medical advice about side effects. You may report side effects to FDA at 1-800-FDA-1088. Where should I keep my medication? This medication is given in a hospital or clinic. It will not be stored at home. NOTE: This sheet is a summary. It may not cover all possible information. If you have questions about this medicine, talk to your doctor, pharmacist, or health care provider.  2023 Elsevier/Gold Standard (2021-06-06  00:00:00)

## 2022-02-24 LAB — KAPPA/LAMBDA LIGHT CHAINS
Kappa free light chain: <0.7 mg/L — ABNORMAL LOW (ref 3.3–19.4)
Kappa, lambda light chain ratio: UNDETERMINED
Lambda free light chains: <1.5 mg/L — ABNORMAL LOW (ref 5.7–26.3)

## 2022-02-25 LAB — IGG, IGA, IGM
IgA: <5 mg/dL — ABNORMAL LOW (ref 87–352)
IgG (Immunoglobin G), Serum: 734 mg/dL (ref 586–1602)
IgM (Immunoglobulin M), Srm: <5 mg/dL — ABNORMAL LOW (ref 26–217)

## 2022-02-27 ENCOUNTER — Encounter: Payer: Self-pay | Admitting: Internal Medicine

## 2022-02-27 ENCOUNTER — Ambulatory Visit (INDEPENDENT_AMBULATORY_CARE_PROVIDER_SITE_OTHER): Payer: Medicare Other | Admitting: Internal Medicine

## 2022-02-27 VITALS — BP 120/84 | HR 97 | Temp 98.3°F | Ht 63.0 in | Wt 172.0 lb

## 2022-02-27 DIAGNOSIS — E876 Hypokalemia: Secondary | ICD-10-CM

## 2022-02-27 DIAGNOSIS — C9000 Multiple myeloma not having achieved remission: Secondary | ICD-10-CM

## 2022-02-27 DIAGNOSIS — Z6830 Body mass index (BMI) 30.0-30.9, adult: Secondary | ICD-10-CM

## 2022-02-27 DIAGNOSIS — E6609 Other obesity due to excess calories: Secondary | ICD-10-CM

## 2022-02-27 DIAGNOSIS — I10 Essential (primary) hypertension: Secondary | ICD-10-CM

## 2022-02-27 LAB — PROTEIN ELECTROPHORESIS, SERUM, WITH REFLEX
A/G Ratio: 1.4 (ref 0.7–1.7)
Albumin ELP: 3.8 g/dL (ref 2.9–4.4)
Alpha-1-Globulin: 0.3 g/dL (ref 0.0–0.4)
Alpha-2-Globulin: 0.9 g/dL (ref 0.4–1.0)
Beta Globulin: 1 g/dL (ref 0.7–1.3)
Gamma Globulin: 0.7 g/dL (ref 0.4–1.8)
Globulin, Total: 2.7 g/dL (ref 2.2–3.9)
Total Protein ELP: 6.5 g/dL (ref 6.0–8.5)

## 2022-02-27 NOTE — Patient Instructions (Addendum)
Start taking amlodipine with evening meal  Hypokalemia Hypokalemia means that the amount of potassium in the blood is lower than normal. Potassium is a mineral (electrolyte) that helps regulate the amount of fluid in the body. It also stimulates muscle tightening (contraction) and helps nerves work properly. Normally, most of the body's potassium is inside cells, and only a very small amount is in the blood. Because the amount in the blood is so small, minor changes to potassium levels in the blood can be life-threatening. What are the causes? This condition may be caused by: Antibiotic medicine. Diarrhea or vomiting. Taking too much of a medicine that helps you have a bowel movement (laxative) can cause diarrhea and lead to hypokalemia. Chronic kidney disease (CKD). Medicines that help the body get rid of excess fluid (diuretics). Eating disorders, such as anorexia or bulimia. Low magnesium levels in the body. Sweating a lot. What are the signs or symptoms? Symptoms of this condition include: Weakness. Constipation. Fatigue. Muscle cramps. Mental confusion. Skipped heartbeats or irregular heartbeat (palpitations). Tingling or numbness. How is this diagnosed? This condition is diagnosed with a blood test. How is this treated? This condition may be treated by: Taking potassium supplements. Adjusting the medicines that you take. Eating more foods that contain a lot of potassium. If your potassium level is very low, you may need to get potassium through an IV and be monitored in the hospital. Follow these instructions at home: Eating and drinking  Eat a healthy diet. A healthy diet includes fresh fruits and vegetables, whole grains, healthy fats, and lean proteins. If told, eat more foods that contain a lot of potassium. These include: Nuts, such as peanuts and pistachios. Seeds, such as sunflower seeds and pumpkin seeds. Peas, lentils, and lima beans. Whole grain and bran cereals  and breads. Fresh fruits and vegetables, such as apricots, avocado, bananas, cantaloupe, kiwi, oranges, tomatoes, asparagus, and potatoes. Juices, such as orange, tomato, and prune. Lean meats, including fish. Milk and milk products, such as yogurt. General instructions Take over-the-counter and prescription medicines only as told by your health care provider. This includes vitamins, natural food products, and supplements. Keep all follow-up visits. This is important. Contact a health care provider if: You have weakness that gets worse. You feel your heart pounding or racing. You vomit. You have diarrhea. You have diabetes and you have trouble keeping your blood sugar in your target range. Get help right away if: You have chest pain. You have shortness of breath. You have vomiting or diarrhea that lasts for more than 2 days. You faint. These symptoms may be an emergency. Get help right away. Call 911. Do not wait to see if the symptoms will go away. Do not drive yourself to the hospital. Summary Hypokalemia means that the amount of potassium in the blood is lower than normal. This condition is diagnosed with a blood test. Hypokalemia may be treated by taking potassium supplements, adjusting the medicines that you take, or eating more foods that are high in potassium. If your potassium level is very low, you may need to get potassium through an IV and be monitored in the hospital. This information is not intended to replace advice given to you by your health care provider. Make sure you discuss any questions you have with your health care provider. Document Revised: 09/30/2020 Document Reviewed: 09/30/2020 Elsevier Patient Education  [place]. Hypertension, Adult Hypertension is another name for high blood pressure. High blood pressure forces your heart to work  harder to pump blood. This can cause problems over time. There are two numbers in a blood pressure reading. There is  a top number (systolic) over a bottom number (diastolic). It is best to have a blood pressure that is below 120/80. What are the causes? The cause of this condition is not known. Some other conditions can lead to high blood pressure. What increases the risk? Some lifestyle factors can make you more likely to develop high blood pressure: Smoking. Not getting enough exercise or physical activity. Being overweight. Having too much fat, sugar, calories, or salt (sodium) in your diet. Drinking too much alcohol. Other risk factors include: Having any of these conditions: Heart disease. Diabetes. High cholesterol. Kidney disease. Obstructive sleep apnea. Having a family history of high blood pressure and high cholesterol. Age. The risk increases with age. Stress. What are the signs or symptoms? High blood pressure may not cause symptoms. Very high blood pressure (hypertensive crisis) may cause: Headache. Fast or uneven heartbeats (palpitations). Shortness of breath. Nosebleed. Vomiting or feeling like you may vomit (nauseous). Changes in how you see. Very bad chest pain. Feeling dizzy. Seizures. How is this treated? This condition is treated by making healthy lifestyle changes, such as: Eating healthy foods. Exercising more. Drinking less alcohol. Your doctor may prescribe medicine if lifestyle changes do not help enough and if: Your top number is above 130. Your bottom number is above 80. Your personal target blood pressure may vary. Follow these instructions at home: Eating and drinking  If told, follow the DASH eating plan. To follow this plan: Fill one half of your plate at each meal with fruits and vegetables. Fill one fourth of your plate at each meal with whole grains. Whole grains include whole-wheat pasta, brown rice, and whole-grain bread. Eat or drink low-fat dairy products, such as skim milk or low-fat yogurt. Fill one fourth of your plate at each meal with low-fat  (lean) proteins. Low-fat proteins include fish, chicken without skin, eggs, beans, and tofu. Avoid fatty meat, cured and processed meat, or chicken with skin. Avoid pre-made or processed food. Limit the amount of salt in your diet to less than 1,500 mg each day. Do not drink alcohol if: Your doctor tells you not to drink. You are pregnant, may be pregnant, or are planning to become pregnant. If you drink alcohol: Limit how much you have to: 0-1 drink a day for women. 0-2 drinks a day for men. Know how much alcohol is in your drink. In the [place] one drink equals one 12 oz bottle of beer (355 mL), one 5 oz glass of wine (148 mL), or one 1 oz glass of hard liquor (44 mL). Lifestyle  Work with your doctor to stay at a healthy weight or to lose weight. Ask your doctor what the best weight is for you. Get at least 30 minutes of exercise that causes your heart to beat faster (aerobic exercise) most days of the week. This may include walking, swimming, or biking. Get at least 30 minutes of exercise that strengthens your muscles (resistance exercise) at least 3 days a week. This may include lifting weights or doing Pilates. Do not smoke or use any products that contain nicotine or tobacco. If you need help quitting, ask your doctor. Check your blood pressure at home as told by your doctor. Keep all follow-up visits. Medicines Take over-the-counter and prescription medicines only as told by your doctor. Follow directions carefully. Do not skip doses of blood pressure medicine.  The medicine does not work as well if you skip doses. Skipping doses also puts you at risk for problems. Ask your doctor about side effects or reactions to medicines that you should watch for. Contact a doctor if: You think you are having a reaction to the medicine you are taking. You have headaches that keep coming back. You feel dizzy. You have swelling in your ankles. You have trouble with your vision. Get help right  away if: You get a very bad headache. You start to feel mixed up (confused). You feel weak or numb. You feel faint. You have very bad pain in your: Chest. Belly (abdomen). You vomit more than once. You have trouble breathing. These symptoms may be an emergency. Get help right away. Call 911. Do not wait to see if the symptoms will go away. Do not drive yourself to the hospital. Summary Hypertension is another name for high blood pressure. High blood pressure forces your heart to work harder to pump blood. For most people, a normal blood pressure is less than 120/80. Making healthy choices can help lower blood pressure. If your blood pressure does not get lower with healthy choices, you may need to take medicine. This information is not intended to replace advice given to you by your health care provider. Make sure you discuss any questions you have with your health care provider. Document Revised: 11/04/2020 Document Reviewed: 11/04/2020 Elsevier Patient Education  [place].

## 2022-02-27 NOTE — Progress Notes (Signed)
I,Victoria T Hamilton,acting as a scribe for Gwynneth Aliment, MD.,have documented all relevant documentation on the behalf of Gwynneth Aliment, MD,as directed by  Gwynneth Aliment, MD while in the presence of Gwynneth Aliment, MD.   Subjective:     Patient ID: Emily Greer , female    DOB: [birth year] , 72 y.o.   MRN: [id number]   Chief Complaint  Patient presents with   Hypertension    HPI  She presents today for BP f/u. She reports compliance with meds. She is concerned because she has had several elevated BP readings at her Oncology appointments. She admits that she is usually rushing and/or has "a lot going on". She denies having headaches and visual disturbances. She has not noticed any DOE.   Hypertension This is a chronic problem. The current episode started more than 1 year ago. The problem has been gradually improving since onset. The problem is controlled. Pertinent negatives include no blurred Emily. Past treatments include beta blockers and diuretics. The current treatment provides moderate improvement. Compliance problems include exercise.  There is no history of pheochromocytoma or renovascular disease.  Leg Pain      Past Medical History:  Diagnosis Date   Avascular necrosis of femoral head (HCC)    bilateral per CT 07-26-2015   Endometrial carcinoma [facility]) gyn oncologist-  dr Nelly Rout ([facility])/  radiation oncologist-- dr Roselind Messier   2013 dx  FIGO Stage 1A, Grade 2 endometrioid endometrial cancer s/p TAH w/ BSO and bilateral pelvic node dissection 10-31-2011 ;  recurrence at distal vagina 04/ 2014 s/p  brachytherapy (ended 07-29-2012);  2nd recurrence 12/ 2016  vaginal apex s/p  conformational radiotherapy 03-10-2015 to 04-20-2015   Family history of adverse reaction to anesthesia    mother ponv   GERD (gastroesophageal reflux disease)    Goals of care, counseling/discussion 08/21/2019   H/O stem cell transplant (HCC)    02/ 2000 and second one 06/ 2016    History of bacteremia    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   History of chemotherapy    last chemo 12-26-17   History of radiation therapy 6/4, 6/11, 6/19, 6/25, 07/29/2012   vagina 30.5 gray in 5 fx, HDR brachytherapy:   last radiation to vagina 03-10-2015 to 04-20-2015  50.4gray   History of radiation therapy 03/10/15-04/20/15   vagina 50.4 in 28 fractions   Hypertension    Lambda light chain myeloma [facility]) oncologist-  dr Myna Hidalgo ([facility])  and  [facility] -- dr Silvestre Mesi gasparetto   dx 07/ 1999 s/p  VAD chemotherapy 11/ 1999,  purged autotransplant 03-21-1998 followed by auto stem cell transplant 03-29-1998;  recurrance w/ second autologous stem cell transplant 07-24-2014;  in Re-mission currently , chemo maintenance therapy   Osteoporosis 12/18/05   Increased  risk    PONV (postoperative nausea and vomiting)    Wears glasses      Family History  Problem Relation Age of Onset   Colon cancer Mother    Hypertension Father    Heart Problems Father      Current Outpatient Medications:    amLODipine (NORVASC) 10 MG tablet, TAKE 1 TABLET(10 MG) BY MOUTH EVERY MORNING, Disp: 90 tablet, Rfl: 1   aspirin EC 81 MG tablet, Take 81 mg by mouth daily., Disp: , Rfl:    betamethasone valerate ointment (VALISONE) 0.1 %, Apply 1 Application topically daily., Disp: , Rfl:    Calcium-Magnesium-Vitamin D (CALCIUM 1200+D3 PO), Take 1  tablet by mouth daily., Disp: , Rfl:    cetirizine (ZYRTEC) 10 MG tablet, Take 1 tablet (10 mg total) by mouth at bedtime., Disp: 90 tablet, Rfl: 1   chlorhexidine (PERIDEX) 0.12 % solution, 10 mLs 2 (two) times daily., Disp: , Rfl:    Cholecalciferol (VITAMIN D3) 2000 UNITS TABS, Take 2 tablets by mouth daily., Disp: , Rfl:    Fluocinolone Acetonide Scalp 0.01 % OIL, Apply topically., Disp: , Rfl:    hydrochlorothiazide (MICROZIDE) 12.5 MG capsule, Take 12.5 mg by mouth every morning., Disp: , Rfl:    letrozole (FEMARA) 2.5 MG tablet, Take 1  tablet (2.5 mg total) by mouth daily., Disp: 30 tablet, Rfl: 12   levETIRAcetam (KEPPRA) 500 MG tablet, Take 500 mg by mouth 2 (two) times daily., Disp: , Rfl:    metoprolol succinate (TOPROL-XL) 50 MG 24 hr tablet, TAKE 1 TABLET(50 MG) BY MOUTH EVERY MORNING, Disp: 90 tablet, Rfl: 3   montelukast (SINGULAIR) 10 MG tablet, TAKE 1 TABLET(10 MG) BY MOUTH AT BEDTIME, Disp: 90 tablet, Rfl: 2   potassium chloride SA (KLOR-CON M) 20 MEQ tablet, Take 1 tablet (20 mEq total) by mouth 2 (two) times daily., Disp: 60 tablet, Rfl: 3   Probiotic Product (PROBIOTIC DAILY PO), Take by mouth daily., Disp: , Rfl:    triamcinolone (NASACORT) 55 MCG/ACT AERO nasal inhaler, Place 2 sprays into the nose as needed., Disp: , Rfl:    triamterene-hydrochlorothiazide (MAXZIDE-25) 37.5-25 MG tablet, 1 tablet by mouth at bedtime, Disp: 90 tablet, Rfl: 2   Zinc Gluconate 50 MG CAPS, Take 1 tablet by mouth daily., Disp: , Rfl:    acyclovir (ZOVIRAX) 400 MG tablet, Take 1 tablet (400 mg total) by mouth 2 (two) times daily., Disp: 60 tablet, Rfl: 6   albuterol (VENTOLIN HFA) 108 (90 Base) MCG/ACT inhaler, Inhale 2 puffs into the lungs every 6 (six) hours as needed for wheezing or shortness of breath. 2 puffs 3 times daily x 5 days then every 6 hours as needed. (Patient not taking: Reported on 02/23/2022), Disp: 18 g, Rfl: 11   cephALEXin (KEFLEX) 500 MG capsule, Take 1 capsule (500 mg total) by mouth 2 (two) times daily., Disp: 14 capsule, Rfl: 0   ibuprofen (ADVIL) 800 MG tablet, Take 800 mg by mouth 3 (three) times daily. (Patient not taking: Reported on 02/23/2022), Disp: , Rfl:    penicillin v potassium (VEETID) 500 MG tablet, Take 500 mg by mouth 4 (four) times daily. Prophylaxis dental procedure (Patient not taking: Reported on 01/19/2022), Disp: , Rfl:  No current facility-administered medications for this visit.  Facility-Administered Medications Ordered in Other Visits:    0.9 %  sodium chloride infusion, , Intravenous,  Once PRN, Ennever, Rose Phi, MD   albuterol (PROVENTIL) (2.5 MG/3ML) 0.083% nebulizer solution 2.5 mg, 2.5 mg, Nebulization, Once PRN, Ennever, Rose Phi, MD   sodium chloride flush (NS) 0.9 % injection 10 mL, 10 mL, Intravenous, PRN, Erenest Blank, NP, 10 mL at 12/26/17 1130   sodium chloride flush (NS) 0.9 % injection 10 mL, 10 mL, Intravenous, PRN, Josph Macho, MD, 10 mL at 04/24/19 1305   sodium chloride flush (NS) 0.9 % injection 10 mL, 10 mL, Intravenous, PRN, Josph Macho, MD, 10 mL at 08/21/19 1233   sodium chloride flush (NS) 0.9 % injection 10 mL, 10 mL, Intracatheter, PRN, Erenest Blank, NP, 10 mL at 10/01/19 1053   sodium chloride flush (NS) 0.9 % injection 10 mL, 10 mL, Intracatheter,  PRN, Erenest Blank, NP, 10 mL at 12/11/19 1414   sodium chloride flush (NS) 0.9 % injection 10 mL, 10 mL, Intravenous, PRN, Erenest Blank, NP, 10 mL at 07/06/21 1105   sodium chloride flush (NS) 0.9 % injection 10 mL, 10 mL, Intracatheter, PRN, Erenest Blank, NP, 10 mL at 07/14/21 0931   Allergies  Allergen Reactions   Codeine Nausea Only     Review of Systems  Constitutional: Negative.   Eyes:  Negative for blurred Emily.  Respiratory: Negative.    Cardiovascular: Negative.   Gastrointestinal: Negative.   Neurological: Negative.   Psychiatric/Behavioral: Negative.       Today's Vitals   02/27/22 1210 02/27/22 1236  BP: (!) 124/90 120/84  Pulse: 97   Temp: 98.3 F (36.8 C)   SpO2: 98%   Weight: 172 lb (78 kg)   Height: 5\' 3"  (1.6 m)    Body mass index is 30.47 kg/m.  Wt Readings from Last 3 Encounters:  03/03/22 172 lb (78 kg)  02/27/22 172 lb (78 kg)  02/23/22 177 lb (80.3 kg)    BP Readings from Last 3 Encounters:  03/03/22 108/72  02/27/22 120/84  02/23/22 (!) 151/81   ) Objective:  Physical Exam Vitals and nursing note reviewed.  Constitutional:      Appearance: Normal appearance.  HENT:     Head: Normocephalic and atraumatic.     Nose:      Comments: Masked     Mouth/Throat:     Comments: Masked  Eyes:     Extraocular Movements: Extraocular movements intact.  Cardiovascular:     Rate and Rhythm: Normal rate and regular rhythm.     Heart sounds: Normal heart sounds.  Pulmonary:     Effort: Pulmonary effort is normal.     Breath sounds: Normal breath sounds.  Musculoskeletal:     Cervical back: Normal range of motion.  Skin:    General: Skin is warm.  Neurological:     General: No focal deficit present.     Mental Status: She is alert.  Psychiatric:        Mood and Affect: Mood normal.        Behavior: Behavior normal.      Assessment And Plan:     1. Essential (primary) hypertension Comments: Initially uncontrolled. Reviewed med list, she is not sure if she is taking HCTZ vs Maxzide. She agrees to rto Friday for BP check. She agrees to bring all meds  2. Multiple myeloma not having achieved remission (HCC) Comments: Chronic, most recent Onc note reviewed.  She is s/p CAR-T therapy.  3. Class 1 obesity due to excess calories without serious comorbidity with body mass index (BMI) of 30.0 to 30.9 in adult Comments: She is encouraged to aim for at least 150 minutes of exercise/week.   Patient was given opportunity to ask questions. Patient verbalized understanding of the plan and was able to repeat key elements of the plan. All questions were answered to their satisfaction.   I, Gwynneth Aliment, MD, have reviewed all documentation for this visit. The documentation on 02/27/22 for the exam, diagnosis, procedures, and orders are all accurate and complete.   IF YOU HAVE BEEN REFERRED TO A SPECIALIST, IT MAY TAKE 1-2 WEEKS TO SCHEDULE/PROCESS THE REFERRAL. IF YOU HAVE NOT HEARD FROM US/SPECIALIST IN TWO WEEKS, PLEASE GIVE [place] A CALL AT [phone] X [phone].   THE PATIENT IS ENCOURAGED TO PRACTICE SOCIAL DISTANCING DUE TO THE COVID-19  PANDEMIC.

## 2022-03-03 ENCOUNTER — Ambulatory Visit (INDEPENDENT_AMBULATORY_CARE_PROVIDER_SITE_OTHER): Payer: Medicare Other | Admitting: Internal Medicine

## 2022-03-03 ENCOUNTER — Encounter: Payer: Self-pay | Admitting: Internal Medicine

## 2022-03-03 VITALS — BP 108/72 | HR 73 | Temp 98.1°F | Ht 63.0 in | Wt 172.0 lb

## 2022-03-03 DIAGNOSIS — E6609 Other obesity due to excess calories: Secondary | ICD-10-CM | POA: Diagnosis not present

## 2022-03-03 DIAGNOSIS — I10 Essential (primary) hypertension: Secondary | ICD-10-CM | POA: Diagnosis not present

## 2022-03-03 DIAGNOSIS — Z6830 Body mass index (BMI) 30.0-30.9, adult: Secondary | ICD-10-CM | POA: Diagnosis not present

## 2022-03-03 DIAGNOSIS — M79604 Pain in right leg: Secondary | ICD-10-CM

## 2022-03-03 DIAGNOSIS — R350 Frequency of micturition: Secondary | ICD-10-CM | POA: Diagnosis not present

## 2022-03-03 NOTE — Progress Notes (Signed)
I,Victoria T [place],acting as a scribe for Gwynneth Aliment, MD.,have documented all relevant documentation on the behalf of Gwynneth Aliment, MD,as directed by  Gwynneth Aliment, MD while in the presence of Gwynneth Aliment, MD.    Subjective:     Patient ID: Emily Greer , female    DOB: 11-08-1950 , 72 y.o.   MRN: [id number]   Chief Complaint  Patient presents with   Hypertension    HPI  Patient presents today for a bp check. Patient had an appointment on 02/27/2022. She is returning today so we can go over her meds; however, she neglected to bring them. She is preparing to leave town.   Hypertension This is a chronic problem. The current episode started more than 1 year ago. The problem has been gradually improving since onset. The problem is controlled. Pertinent negatives include no blurred vision. Past treatments include beta blockers and diuretics. The current treatment provides moderate improvement. Compliance problems include exercise.  There is no history of pheochromocytoma or renovascular disease.  Leg Pain      Past Medical History:  Diagnosis Date   Avascular necrosis of femoral head (HCC)    bilateral per CT 07-26-2015   Endometrial carcinoma [facility]) gyn oncologist-  dr Nelly Rout ([facility])/  radiation oncologist-- dr Roselind Messier   2013 dx  FIGO Stage 1A, Grade 2 endometrioid endometrial cancer s/p TAH w/ BSO and bilateral pelvic node dissection 10-31-2011 ;  recurrence at distal vagina 04/ 2014 s/p  brachytherapy (ended 07-29-2012);  2nd recurrence 12/ 2016  vaginal apex s/p  conformational radiotherapy 03-10-2015 to 04-20-2015   Family history of adverse reaction to anesthesia    mother ponv   GERD (gastroesophageal reflux disease)    Goals of care, counseling/discussion 08/21/2019   H/O stem cell transplant (HCC)    02/ 2000 and second one 06/ 2016   History of bacteremia    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   History of chemotherapy     last chemo 12-26-17   History of radiation therapy 6/4, 6/11, 6/19, 6/25, 07/29/2012   vagina 30.5 gray in 5 fx, HDR brachytherapy:   last radiation to vagina 03-10-2015 to 04-20-2015  50.4gray   History of radiation therapy 03/10/15-04/20/15   vagina 50.4 in 28 fractions   Hypertension    Lambda light chain myeloma [facility]) oncologist-  dr Myna Hidalgo ([facility])  and  [facility] -- dr Silvestre Mesi gasparetto   dx 07/ 1999 s/p  VAD chemotherapy 11/ 1999,  purged autotransplant 03-21-1998 followed by auto stem cell transplant 03-29-1998;  recurrance w/ second autologous stem cell transplant 07-24-2014;  in Re-mission currently , chemo maintenance therapy   Osteoporosis 12/18/05   Increased  risk    PONV (postoperative nausea and vomiting)    Wears glasses      Family History  Problem Relation Age of Onset   Colon cancer Mother    Hypertension Father    Heart Problems Father      Current Outpatient Medications:    amLODipine (NORVASC) 10 MG tablet, TAKE 1 TABLET(10 MG) BY MOUTH EVERY MORNING, Disp: 90 tablet, Rfl: 1   aspirin EC 81 MG tablet, Take 81 mg by mouth daily., Disp: , Rfl:    betamethasone valerate ointment (VALISONE) 0.1 %, Apply 1 Application topically daily., Disp: , Rfl:    Calcium-Magnesium-Vitamin D (CALCIUM 1200+D3 PO), Take 1 tablet by mouth daily., Disp: , Rfl:    cetirizine (ZYRTEC) 10 MG tablet,  Take 1 tablet (10 mg total) by mouth at bedtime., Disp: 90 tablet, Rfl: 1   chlorhexidine (PERIDEX) 0.12 % solution, 10 mLs 2 (two) times daily., Disp: , Rfl:    Cholecalciferol (VITAMIN D3) 2000 UNITS TABS, Take 2 tablets by mouth daily., Disp: , Rfl:    Fluocinolone Acetonide Scalp 0.01 % OIL, Apply topically., Disp: , Rfl:    hydrochlorothiazide (MICROZIDE) 12.5 MG capsule, Take 12.5 mg by mouth every morning., Disp: , Rfl:    letrozole (FEMARA) 2.5 MG tablet, Take 1 tablet (2.5 mg total) by mouth daily., Disp: 30 tablet, Rfl: 12   levETIRAcetam (KEPPRA) 500 MG tablet, Take 500  mg by mouth 2 (two) times daily., Disp: , Rfl:    metoprolol succinate (TOPROL-XL) 50 MG 24 hr tablet, TAKE 1 TABLET(50 MG) BY MOUTH EVERY MORNING, Disp: 90 tablet, Rfl: 3   montelukast (SINGULAIR) 10 MG tablet, TAKE 1 TABLET(10 MG) BY MOUTH AT BEDTIME, Disp: 90 tablet, Rfl: 2   potassium chloride SA (KLOR-CON M) 20 MEQ tablet, Take 1 tablet (20 mEq total) by mouth 2 (two) times daily., Disp: 60 tablet, Rfl: 3   Probiotic Product (PROBIOTIC DAILY PO), Take by mouth daily., Disp: , Rfl:    triamcinolone (NASACORT) 55 MCG/ACT AERO nasal inhaler, Place 2 sprays into the nose as needed., Disp: , Rfl:    triamterene-hydrochlorothiazide (MAXZIDE-25) 37.5-25 MG tablet, 1 tablet by mouth at bedtime, Disp: 90 tablet, Rfl: 2   Zinc Gluconate 50 MG CAPS, Take 1 tablet by mouth daily., Disp: , Rfl:    acyclovir (ZOVIRAX) 400 MG tablet, Take 1 tablet (400 mg total) by mouth 2 (two) times daily., Disp: 60 tablet, Rfl: 6   albuterol (VENTOLIN HFA) 108 (90 Base) MCG/ACT inhaler, Inhale 2 puffs into the lungs every 6 (six) hours as needed for wheezing or shortness of breath. 2 puffs 3 times daily x 5 days then every 6 hours as needed. (Patient not taking: Reported on 02/23/2022), Disp: 18 g, Rfl: 11   cephALEXin (KEFLEX) 500 MG capsule, Take 1 capsule (500 mg total) by mouth 2 (two) times daily., Disp: 14 capsule, Rfl: 0   ibuprofen (ADVIL) 800 MG tablet, Take 800 mg by mouth 3 (three) times daily. (Patient not taking: Reported on 02/23/2022), Disp: , Rfl:    penicillin v potassium (VEETID) 500 MG tablet, Take 500 mg by mouth 4 (four) times daily. Prophylaxis dental procedure (Patient not taking: Reported on 01/19/2022), Disp: , Rfl:  No current facility-administered medications for this visit.  Facility-Administered Medications Ordered in Other Visits:    0.9 %  sodium chloride infusion, , Intravenous, Once PRN, Ennever, Rose Phi, MD   albuterol (PROVENTIL) (2.5 MG/3ML) 0.083% nebulizer solution 2.5 mg, 2.5 mg,  Nebulization, Once PRN, Ennever, Rose Phi, MD   sodium chloride flush (NS) 0.9 % injection 10 mL, 10 mL, Intravenous, PRN, Erenest Blank, NP, 10 mL at 12/26/17 1130   sodium chloride flush (NS) 0.9 % injection 10 mL, 10 mL, Intravenous, PRN, Josph Macho, MD, 10 mL at 04/24/19 1305   sodium chloride flush (NS) 0.9 % injection 10 mL, 10 mL, Intravenous, PRN, Josph Macho, MD, 10 mL at 08/21/19 1233   sodium chloride flush (NS) 0.9 % injection 10 mL, 10 mL, Intracatheter, PRN, Erenest Blank, NP, 10 mL at 10/01/19 1053   sodium chloride flush (NS) 0.9 % injection 10 mL, 10 mL, Intracatheter, PRN, Erenest Blank, NP, 10 mL at 12/11/19 1414   sodium chloride flush (  NS) 0.9 % injection 10 mL, 10 mL, Intravenous, PRN, Erenest Blank, NP, 10 mL at 07/06/21 1105   sodium chloride flush (NS) 0.9 % injection 10 mL, 10 mL, Intracatheter, PRN, Erenest Blank, NP, 10 mL at 07/14/21 0931   Allergies  Allergen Reactions   Codeine Nausea Only     Review of Systems  Constitutional: Negative.   Eyes:  Negative for blurred vision.  Respiratory: Negative.    Cardiovascular: Negative.   Gastrointestinal: Negative.   Genitourinary:  Positive for frequency.  Neurological: Negative.   Psychiatric/Behavioral: Negative.       Today's Vitals   03/03/22 1241  BP: 108/72  Pulse: 73  Temp: 98.1 F (36.7 C)  SpO2: 98%  Weight: 172 lb (78 kg)  Height: 5\' 3"  (1.6 m)   Body mass index is 30.47 kg/m.  Wt Readings from Last 3 Encounters:  03/03/22 172 lb (78 kg)  02/27/22 172 lb (78 kg)  02/23/22 177 lb (80.3 kg)    Objective:  Physical Exam Vitals and nursing note reviewed.  Constitutional:      Appearance: Normal appearance.  HENT:     Head: Normocephalic and atraumatic.     Nose:     Comments: Masked     Mouth/Throat:     Comments: Masked  Eyes:     Extraocular Movements: Extraocular movements intact.  Cardiovascular:     Rate and Rhythm: Normal rate and regular rhythm.     Heart  sounds: Normal heart sounds.  Pulmonary:     Effort: Pulmonary effort is normal.     Breath sounds: Normal breath sounds.  Skin:    General: Skin is warm.  Neurological:     General: No focal deficit present.     Mental Status: She is alert.  Psychiatric:        Mood and Affect: Mood normal.        Behavior: Behavior normal.         Assessment And Plan:     1. Essential (primary) hypertension Comments: Chronic, controlled. She is encouraged to call the office next week when she returns to town with her med list. NO med changes today.  2. Urinary frequency Comments: She is on diuretic. I will check u/a to r/o UTI. If positive, will send off culture. Sh eis in agreement with treatment plan. - Urinalysis, Reflex Microscopic  3. Class 1 obesity due to excess calories with serious comorbidity and body mass index (BMI) of 30.0 to 30.9 in adult She is encouraged to strive for BMI less than 30 to decrease cardiac risk. Advised to aim for at least 150 minutes of exercise per week.    Patient was given opportunity to ask questions. Patient verbalized understanding of the plan and was able to repeat key elements of the plan. All questions were answered to their satisfaction.   I, Gwynneth Aliment, MD, have reviewed all documentation for this visit. The documentation on 03/26/22 for the exam, diagnosis, procedures, and orders are all accurate and complete.   IF YOU HAVE BEEN REFERRED TO A SPECIALIST, IT MAY TAKE 1-2 WEEKS TO SCHEDULE/PROCESS THE REFERRAL. IF YOU HAVE NOT HEARD FROM US/SPECIALIST IN TWO WEEKS, PLEASE GIVE [place] A CALL AT [phone].   THE PATIENT IS ENCOURAGED TO PRACTICE SOCIAL DISTANCING DUE TO THE COVID-19 PANDEMIC.

## 2022-03-03 NOTE — Patient Instructions (Signed)
Hypertension, Adult Hypertension is another name for high blood pressure. High blood pressure forces your heart to work harder to pump blood. This can cause problems over time. There are two numbers in a blood pressure reading. There is a top number (systolic) over a bottom number (diastolic). It is best to have a blood pressure that is below 120/80. What are the causes? The cause of this condition is not known. Some other conditions can lead to high blood pressure. What increases the risk? Some lifestyle factors can make you more likely to develop high blood pressure: Smoking. Not getting enough exercise or physical activity. Being overweight. Having too much fat, sugar, calories, or salt (sodium) in your diet. Drinking too much alcohol. Other risk factors include: Having any of these conditions: Heart disease. Diabetes. High cholesterol. Kidney disease. Obstructive sleep apnea. Having a family history of high blood pressure and high cholesterol. Age. The risk increases with age. Stress. What are the signs or symptoms? High blood pressure may not cause symptoms. Very high blood pressure (hypertensive crisis) may cause: Headache. Fast or uneven heartbeats (palpitations). Shortness of breath. Nosebleed. Vomiting or feeling like you may vomit (nauseous). Changes in how you see. Very bad chest pain. Feeling dizzy. Seizures. How is this treated? This condition is treated by making healthy lifestyle changes, such as: Eating healthy foods. Exercising more. Drinking less alcohol. Your doctor may prescribe medicine if lifestyle changes do not help enough and if: Your top number is above 130. Your bottom number is above 80. Your personal target blood pressure may vary. Follow these instructions at home: Eating and drinking  If told, follow the DASH eating plan. To follow this plan: Fill one half of your plate at each meal with fruits and vegetables. Fill one fourth of your plate  at each meal with whole grains. Whole grains include whole-wheat pasta, brown rice, and whole-grain bread. Eat or drink low-fat dairy products, such as skim milk or low-fat yogurt. Fill one fourth of your plate at each meal with low-fat (lean) proteins. Low-fat proteins include fish, chicken without skin, eggs, beans, and tofu. Avoid fatty meat, cured and processed meat, or chicken with skin. Avoid pre-made or processed food. Limit the amount of salt in your diet to less than 1,500 mg each day. Do not drink alcohol if: Your doctor tells you not to drink. You are pregnant, may be pregnant, or are planning to become pregnant. If you drink alcohol: Limit how much you have to: 0-1 drink a day for women. 0-2 drinks a day for men. Know how much alcohol is in your drink. In the [place] one drink equals one 12 oz bottle of beer (355 mL), one 5 oz glass of wine (148 mL), or one 1 oz glass of hard liquor (44 mL). Lifestyle  Work with your doctor to stay at a healthy weight or to lose weight. Ask your doctor what the best weight is for you. Get at least 30 minutes of exercise that causes your heart to beat faster (aerobic exercise) most days of the week. This may include walking, swimming, or biking. Get at least 30 minutes of exercise that strengthens your muscles (resistance exercise) at least 3 days a week. This may include lifting weights or doing Pilates. Do not smoke or use any products that contain nicotine or tobacco. If you need help quitting, ask your doctor. Check your blood pressure at home as told by your doctor. Keep all follow-up visits. Medicines Take over-the-counter and prescription medicines  only as told by your doctor. Follow directions carefully. ?Do not skip doses of blood pressure medicine. The medicine does not work as well if you skip doses. Skipping doses also puts you at risk for problems. ?Ask your doctor about side effects or reactions to medicines that you should watch  for. ?Contact a doctor if: ?You think you are having a reaction to the medicine you are taking. ?You have headaches that keep coming back. ?You feel dizzy. ?You have swelling in your ankles. ?You have trouble with your vision. ?Get help right away if: ?You get a very bad headache. ?You start to feel mixed up (confused). ?You feel weak or numb. ?You feel faint. ?You have very bad pain in your: ?Chest. ?Belly (abdomen). ?You vomit more than once. ?You have trouble breathing. ?These symptoms may be an emergency. Get help right away. Call 911. ?Do not wait to see if the symptoms will go away. ?Do not drive yourself to the hospital. ?Summary ?Hypertension is another name for high blood pressure. ?High blood pressure forces your heart to work harder to pump blood. ?For most people, a normal blood pressure is less than 120/80. ?Making healthy choices can help lower blood pressure. If your blood pressure does not get lower with healthy choices, you may need to take medicine. ?This information is not intended to replace advice given to you by your health care provider. Make sure you discuss any questions you have with your health care provider. ?Document Revised: 11/04/2020 Document Reviewed: 11/04/2020 ?Elsevier Patient Education ? 2023 Elsevier Inc. ? ?

## 2022-03-04 LAB — URINALYSIS, ROUTINE W REFLEX MICROSCOPIC
Bilirubin, UA: NEGATIVE
Glucose, UA: NEGATIVE
Ketones, UA: NEGATIVE
Nitrite, UA: NEGATIVE
Specific Gravity, UA: 1.026 (ref 1.005–1.030)
Urobilinogen, Ur: 0.2 mg/dL (ref 0.2–1.0)
pH, UA: 5.5 (ref 5.0–7.5)

## 2022-03-04 LAB — MICROSCOPIC EXAMINATION
Bacteria, UA: NONE SEEN
Casts: NONE SEEN /lpf
WBC, UA: >30 /hpf — AB (ref 0–5)

## 2022-03-08 ENCOUNTER — Encounter: Payer: Self-pay | Admitting: Hematology & Oncology

## 2022-03-08 ENCOUNTER — Telehealth: Payer: Self-pay

## 2022-03-08 ENCOUNTER — Other Ambulatory Visit: Payer: Self-pay | Admitting: Internal Medicine

## 2022-03-08 MED ORDER — CEPHALEXIN 500 MG PO CAPS: 500.0000 mg | ORAL_CAPSULE | Freq: Two times a day (BID) | ORAL | 0 refills | Status: DC

## 2022-03-09 ENCOUNTER — Ambulatory Visit: Payer: Self-pay

## 2022-03-09 NOTE — Patient Outreach (Signed)
Care Coordination   Follow Up Visit Note   03/09/2022 Name: DESHUN LORMAN MRN: [id number] DOB: 07/27/1950  LASHANIQUE GARCIAGARCIA is a 72 y.o. year old female who sees [name], MD for primary care. I spoke with  Myrtie Cruise by phone today.  What matters to the patients health and wellness today?  Patient is unavailable due she is driving. Patient is currently not enrolled in the [employer].     Goals Addressed             This Visit's Progress    COMPLETED: I would like to stay healthy       Care Coordination Interventions: Discussed with patient she is currently driving and is unable to provide an RN CC update  Determined patient is currently not enrolled with [facility], no further follow up scheduled at this time         SDOH assessments and interventions completed:  No     Care Coordination Interventions:  No, not indicated   Follow up plan: No further intervention required.   Encounter Outcome:  Pt. Visit Completed

## 2022-03-09 NOTE — Patient Instructions (Signed)
Visit Information  Thank you for taking time to visit with me today. Please don't hesitate to contact me if I can be of assistance to you.   Following are the goals we discussed today:   Goals Addressed             This Visit's Progress    COMPLETED: I would like to stay healthy       Care Coordination Interventions: Discussed with patient she is currently driving and is unable to provide an RN CC update  Determined patient is currently not enrolled with [facility], no further follow up scheduled at this time        If you are experiencing a Mental Health or [place] or need someone to talk to, please call 1-800-273-TALK (toll free, 24 hour hotline) go to [facility] [address] [place] [phone])  Patient verbalizes understanding of instructions and care plan provided today and agrees to view in [place]. Active MyChart status and patient understanding of how to access instructions and care plan via MyChart confirmed with patient.     Barb Merino, RN, BSN, CCM Care Management Coordinator [facility] Care Management  Direct Phone: [phone]

## 2022-03-10 ENCOUNTER — Other Ambulatory Visit: Payer: Self-pay | Admitting: *Deleted

## 2022-03-10 MED ORDER — ACYCLOVIR 400 MG PO TABS: 400.0000 mg | ORAL_TABLET | Freq: Two times a day (BID) | ORAL | 6 refills | Status: DC

## 2022-03-17 ENCOUNTER — Encounter: Payer: Self-pay | Admitting: General Practice

## 2022-03-20 DIAGNOSIS — K047 Periapical abscess without sinus: Secondary | ICD-10-CM | POA: Diagnosis not present

## 2022-03-20 DIAGNOSIS — E8809 Other disorders of plasma-protein metabolism, not elsewhere classified: Secondary | ICD-10-CM | POA: Diagnosis not present

## 2022-03-20 DIAGNOSIS — R6889 Other general symptoms and signs: Secondary | ICD-10-CM | POA: Diagnosis not present

## 2022-03-20 DIAGNOSIS — C411 Malignant neoplasm of mandible: Secondary | ICD-10-CM | POA: Diagnosis not present

## 2022-03-20 DIAGNOSIS — Z9289 Personal history of other medical treatment: Secondary | ICD-10-CM | POA: Diagnosis not present

## 2022-03-20 DIAGNOSIS — C9001 Multiple myeloma in remission: Secondary | ICD-10-CM | POA: Diagnosis not present

## 2022-03-20 DIAGNOSIS — E876 Hypokalemia: Secondary | ICD-10-CM | POA: Diagnosis not present

## 2022-03-20 DIAGNOSIS — D801 Nonfamilial hypogammaglobulinemia: Secondary | ICD-10-CM | POA: Diagnosis not present

## 2022-03-26 ENCOUNTER — Encounter: Payer: Self-pay | Admitting: Internal Medicine

## 2022-04-03 ENCOUNTER — Telehealth: Payer: Self-pay

## 2022-04-04 ENCOUNTER — Other Ambulatory Visit: Payer: Self-pay | Admitting: Internal Medicine

## 2022-04-04 ENCOUNTER — Other Ambulatory Visit (INDEPENDENT_AMBULATORY_CARE_PROVIDER_SITE_OTHER): Payer: Medicare Other

## 2022-04-04 DIAGNOSIS — R350 Frequency of micturition: Secondary | ICD-10-CM | POA: Diagnosis not present

## 2022-04-04 DIAGNOSIS — R3129 Other microscopic hematuria: Secondary | ICD-10-CM

## 2022-04-04 LAB — POCT URINALYSIS DIPSTICK
Bilirubin, UA: NEGATIVE
Glucose, UA: NEGATIVE
Ketones, UA: NEGATIVE
Leukocytes, UA: NEGATIVE
Nitrite, UA: NEGATIVE
Protein, UA: NEGATIVE
Spec Grav, UA: >=1.03 — AB (ref 1.010–1.025)
Urobilinogen, UA: 0.2 E.U./dL
pH, UA: 6 (ref 5.0–8.0)

## 2022-04-06 ENCOUNTER — Inpatient Hospital Stay: Payer: Medicare Other

## 2022-04-06 ENCOUNTER — Other Ambulatory Visit: Payer: Self-pay

## 2022-04-06 ENCOUNTER — Inpatient Hospital Stay (HOSPITAL_BASED_OUTPATIENT_CLINIC_OR_DEPARTMENT_OTHER): Payer: Medicare Other | Admitting: Hematology & Oncology

## 2022-04-06 ENCOUNTER — Encounter: Payer: Self-pay | Admitting: Hematology & Oncology

## 2022-04-06 ENCOUNTER — Inpatient Hospital Stay: Payer: Medicare Other | Attending: Hematology & Oncology

## 2022-04-06 ENCOUNTER — Telehealth: Payer: Self-pay | Admitting: *Deleted

## 2022-04-06 VITALS — BP 145/77

## 2022-04-06 VITALS — BP 153/79 | HR 81 | Temp 98.6°F | Resp 16 | Ht 63.0 in | Wt 174.0 lb

## 2022-04-06 DIAGNOSIS — Z79899 Other long term (current) drug therapy: Secondary | ICD-10-CM | POA: Diagnosis not present

## 2022-04-06 DIAGNOSIS — R350 Frequency of micturition: Secondary | ICD-10-CM

## 2022-04-06 DIAGNOSIS — C9000 Multiple myeloma not having achieved remission: Secondary | ICD-10-CM

## 2022-04-06 DIAGNOSIS — C9001 Multiple myeloma in remission: Secondary | ICD-10-CM

## 2022-04-06 DIAGNOSIS — Z885 Allergy status to narcotic agent status: Secondary | ICD-10-CM | POA: Diagnosis not present

## 2022-04-06 DIAGNOSIS — H538 Other visual disturbances: Secondary | ICD-10-CM | POA: Insufficient documentation

## 2022-04-06 DIAGNOSIS — Z923 Personal history of irradiation: Secondary | ICD-10-CM | POA: Insufficient documentation

## 2022-04-06 DIAGNOSIS — N390 Urinary tract infection, site not specified: Secondary | ICD-10-CM | POA: Diagnosis not present

## 2022-04-06 DIAGNOSIS — D509 Iron deficiency anemia, unspecified: Secondary | ICD-10-CM | POA: Diagnosis not present

## 2022-04-06 DIAGNOSIS — Z8542 Personal history of malignant neoplasm of other parts of uterus: Secondary | ICD-10-CM | POA: Insufficient documentation

## 2022-04-06 DIAGNOSIS — M7989 Other specified soft tissue disorders: Secondary | ICD-10-CM | POA: Diagnosis not present

## 2022-04-06 DIAGNOSIS — Z79811 Long term (current) use of aromatase inhibitors: Secondary | ICD-10-CM | POA: Insufficient documentation

## 2022-04-06 DIAGNOSIS — C541 Malignant neoplasm of endometrium: Secondary | ICD-10-CM

## 2022-04-06 LAB — CBC WITH DIFFERENTIAL (CANCER CENTER ONLY)
Abs Immature Granulocytes: 0.13 10*3/uL — ABNORMAL HIGH (ref 0.00–0.07)
Basophils Absolute: 0 10*3/uL (ref 0.0–0.1)
Basophils Relative: 1 %
Eosinophils Absolute: 0.1 10*3/uL (ref 0.0–0.5)
Eosinophils Relative: 4 %
HCT: 29.1 % — ABNORMAL LOW (ref 36.0–46.0)
Hemoglobin: 9.3 g/dL — ABNORMAL LOW (ref 12.0–15.0)
Immature Granulocytes: 5 %
Lymphocytes Relative: 11 %
Lymphs Abs: 0.3 10*3/uL — ABNORMAL LOW (ref 0.7–4.0)
MCH: 26.5 pg (ref 26.0–34.0)
MCHC: 32 g/dL (ref 30.0–36.0)
MCV: 82.9 fL (ref 80.0–100.0)
Monocytes Absolute: 0.5 10*3/uL (ref 0.1–1.0)
Monocytes Relative: 20 %
Neutro Abs: 1.4 10*3/uL — ABNORMAL LOW (ref 1.7–7.7)
Neutrophils Relative %: 59 %
Platelet Count: 104 10*3/uL — ABNORMAL LOW (ref 150–400)
RBC: 3.51 MIL/uL — ABNORMAL LOW (ref 3.87–5.11)
RDW: 18.3 % — ABNORMAL HIGH (ref 11.5–15.5)
WBC Count: 2.4 10*3/uL — ABNORMAL LOW (ref 4.0–10.5)
nRBC: 0 % (ref 0.0–0.2)

## 2022-04-06 LAB — CMP (CANCER CENTER ONLY)
ALT: 15 U/L (ref 0–44)
AST: 16 U/L (ref 15–41)
Albumin: 4.2 g/dL (ref 3.5–5.0)
Alkaline Phosphatase: 44 U/L (ref 38–126)
Anion gap: 10 (ref 5–15)
BUN: 17 mg/dL (ref 8–23)
CO2: 27 mmol/L (ref 22–32)
Calcium: 9.6 mg/dL (ref 8.9–10.3)
Chloride: 104 mmol/L (ref 98–111)
Creatinine: 0.96 mg/dL (ref 0.44–1.00)
GFR, Estimated: >60 mL/min (ref >60)
Glucose, Bld: 101 mg/dL — ABNORMAL HIGH (ref 70–99)
Potassium: 3.4 mmol/L — ABNORMAL LOW (ref 3.5–5.1)
Sodium: 141 mmol/L (ref 135–145)
Total Bilirubin: 0.3 mg/dL (ref 0.3–1.2)
Total Protein: 7 g/dL (ref 6.5–8.1)

## 2022-04-06 LAB — URINALYSIS, COMPLETE (UACMP) WITH MICROSCOPIC
Bilirubin Urine: NEGATIVE
Glucose, UA: NEGATIVE mg/dL
Ketones, ur: NEGATIVE mg/dL
Leukocytes,Ua: NEGATIVE
Nitrite: NEGATIVE
Protein, ur: NEGATIVE mg/dL
Specific Gravity, Urine: 1.02 (ref 1.005–1.030)
pH: 5.5 (ref 5.0–8.0)

## 2022-04-06 LAB — FERRITIN: Ferritin: 1642 ng/mL — ABNORMAL HIGH (ref 11–307)

## 2022-04-06 LAB — IRON AND IRON BINDING CAPACITY (CC-WL,HP ONLY)
Iron: 88 ug/dL (ref 28–170)
Saturation Ratios: 27 % (ref 10.4–31.8)
TIBC: 322 ug/dL (ref 250–450)
UIBC: 234 ug/dL (ref 148–442)

## 2022-04-06 LAB — LACTATE DEHYDROGENASE: LDH: 223 U/L — ABNORMAL HIGH (ref 98–192)

## 2022-04-06 MED ORDER — EPOETIN ALFA-EPBX 40000 UNIT/ML IJ SOLN
40000.0000 [IU] | Freq: Once | INTRAMUSCULAR | Status: AC
  Administered 2022-04-06: 40000 [IU] via SUBCUTANEOUS
  Filled 2022-04-06: qty 1

## 2022-04-06 MED ORDER — IMMUNE GLOBULIN (HUMAN) 10 GM/100ML IV SOLN
40.0000 g | Freq: Once | INTRAVENOUS | Status: DC
  Filled 2022-04-06: qty 400

## 2022-04-06 MED ORDER — DIPHENHYDRAMINE HCL 25 MG PO CAPS
25.0000 mg | ORAL_CAPSULE | Freq: Once | ORAL | Status: AC
  Administered 2022-04-06: 25 mg via ORAL
  Filled 2022-04-06: qty 1

## 2022-04-06 MED ORDER — SODIUM CHLORIDE 0.9% FLUSH
10.0000 mL | Freq: Once | INTRAVENOUS | Status: AC | PRN
  Administered 2022-04-06: 10 mL

## 2022-04-06 MED ORDER — DEXTROSE 5 % IV SOLN: INTRAVENOUS | Status: DC

## 2022-04-06 MED ORDER — SODIUM CHLORIDE 0.9% FLUSH
10.0000 mL | Freq: Once | INTRAVENOUS | Status: AC
  Administered 2022-04-06: 10 mL

## 2022-04-06 MED ORDER — ACETAMINOPHEN 325 MG PO TABS
650.0000 mg | ORAL_TABLET | Freq: Once | ORAL | Status: AC
  Administered 2022-04-06: 650 mg via ORAL
  Filled 2022-04-06: qty 2

## 2022-04-06 MED ORDER — HEPARIN SOD (PORK) LOCK FLUSH 100 UNIT/ML IV SOLN
500.0000 [IU] | Freq: Once | INTRAVENOUS | Status: AC | PRN
  Administered 2022-04-06: 500 [IU]

## 2022-04-06 NOTE — Progress Notes (Signed)
She is Hematology and Oncology Follow Up Visit  Emily Greer [id number] 1950/04/26 72 y.o. 04/06/2022   Principle Diagnosis:  Recurrent lambda light chain myeloma - nl cytogenetics History of recurrent endometrial carcinoma Iron deficiency anemia -blood loss   Past Therapy:             Status post second autologous stem cell transplant on 07/24/2014 Maintenance therapy with Pomalidomide/every 2 week Velcade - d/c'ed Radiation therapy for endometrial recurrence - completed 04/20/2015 Pomalyst/Kyprolis 70mg /m2 IV q 2 weeks - s/p cycle #12 - held on 12/26/2017 for hematuria Daratumumab/Pomalyst (1 mg) - status post cycle 19 -- d/c on 08/21/2019 Melflufen 40 mg IV q 4 weeks -- started on 08/27/2019, s/p cycle #2 --  D/c due to FDA removal Selinexor 60 mg po q week -- start on 01/19/2020 -- changed on 03/18/2020 -- d/c on 04/27/2020   Current Therapy:        Blenrep 2.5 mg/m2 IV q 3 weeks -- started on 05/20/2020, s/p cycle #7 --D/C secondary to progression on 12/22/2020 Carfilzomib/Sarclisa -- s/p cycle #3 -- start on 01/03/2021 Femara 2.5 mg po q day  IV iron-Feraheme given on 07/15/2021. Cytoxan/Pomalyst/decadron -- start cycle #1 on 06/15/2021 - d/c 06/2021 S/p Cilta-cel CAR-T infusion -- 07/22/2021 Xgeva 120 mg IM q 3 months -- next dose 07/2022 Retacrit 40,000 units sq q 4 weeks for Hgb < 11 IVIG 40g IV q month -- start on 11/16/2021   Interim History:  Emily Greer is here today for follow-up.  She is doing pretty well I think she is having some problems with urinary tract infections.  I know she sees Dr. Allyne Gee for this.  We will go ahead and check a urinalysis on her to see if she has 1.  The last time that we saw her back in January, her IgG level was 734 mg/dL.  I think we can probably hold off on IVIG today.  When we last saw her, her ferritin was 1900 with an iron saturation of only 25%.  She has had no problem with bleeding.  There is been no issues with nausea or  vomiting.  She has had no cough or shortness of breath.  She has had no bony pain.  She still has little bit of leg swelling but this has been chronic.  Her last Kappa light chain was less than 0.7 Mg/L.   She has had no fever.  There is been no headaches.  She continues on Femara.  She is doing this for the past history of uterine cancer.  Currently, I would have to set her performance status is probably ECOG 1.     Medications:  Allergies as of 04/06/2022       Reactions   Codeine Nausea Only        Medication List        Accurate as of April 06, 2022  8:50 AM. If you have any questions, ask your nurse or doctor.          acyclovir 400 MG tablet Commonly known as: ZOVIRAX Take 1 tablet (400 mg total) by mouth 2 (two) times daily.   albuterol 108 (90 Base) MCG/ACT inhaler Commonly known as: VENTOLIN HFA Inhale 2 puffs into the lungs every 6 (six) hours as needed for wheezing or shortness of breath. 2 puffs 3 times daily x 5 days then every 6 hours as needed.   amLODipine 10 MG tablet Commonly known as: NORVASC TAKE 1 TABLET(10 MG) BY MOUTH  EVERY MORNING   aspirin EC 81 MG tablet Take 81 mg by mouth daily.   betamethasone valerate ointment 0.1 % Commonly known as: VALISONE Apply 1 Application topically daily.   CALCIUM 1200+D3 PO Take 1 tablet by mouth daily.   cephALEXin 500 MG capsule Commonly known as: KEFLEX Take 1 capsule (500 mg total) by mouth 2 (two) times daily.   cetirizine 10 MG tablet Commonly known as: ZYRTEC Take 1 tablet (10 mg total) by mouth at bedtime.   chlorhexidine 0.12 % solution Commonly known as: PERIDEX 10 mLs 2 (two) times daily.   Fluocinolone Acetonide Scalp 0.01 % Oil Apply topically.   hydrochlorothiazide 12.5 MG capsule Commonly known as: MICROZIDE Take 12.5 mg by mouth every morning.   ibuprofen 800 MG tablet Commonly known as: ADVIL Take 800 mg by mouth 3 (three) times daily.   letrozole 2.5 MG tablet Commonly  known as: Femara Take 1 tablet (2.5 mg total) by mouth daily.   levETIRAcetam 500 MG tablet Commonly known as: KEPPRA Take 500 mg by mouth 2 (two) times daily.   metoprolol succinate 50 MG 24 hr tablet Commonly known as: TOPROL-XL TAKE 1 TABLET(50 MG) BY MOUTH EVERY MORNING   montelukast 10 MG tablet Commonly known as: SINGULAIR TAKE 1 TABLET(10 MG) BY MOUTH AT BEDTIME   penicillin v potassium 500 MG tablet Commonly known as: VEETID Take 500 mg by mouth 4 (four) times daily. Prophylaxis dental procedure   potassium chloride SA 20 MEQ tablet Commonly known as: KLOR-CON M Take 1 tablet (20 mEq total) by mouth 2 (two) times daily.   PROBIOTIC DAILY PO Take by mouth daily.   triamcinolone 55 MCG/ACT Aero nasal inhaler Commonly known as: NASACORT Place 2 sprays into the nose as needed.   triamterene-hydrochlorothiazide 37.5-25 MG tablet Commonly known as: MAXZIDE-25 1 tablet by mouth at bedtime   Vitamin D3 50 MCG (2000 UT) Tabs Generic drug: Cholecalciferol Take 2 tablets by mouth daily.   Zinc Gluconate 50 MG Caps Take 1 tablet by mouth daily.        Allergies:  Allergies  Allergen Reactions   Codeine Nausea Only    Past Medical History, Surgical history, Social history, and Family History were reviewed and updated.  Review of Systems: Review of Systems  Constitutional: Negative.   HENT: Negative.    Eyes:  Positive for blurred vision.  Respiratory: Negative.    Cardiovascular: Negative.   Gastrointestinal: Negative.   Genitourinary: Negative.   Musculoskeletal: Negative.   Skin: Negative.   Neurological: Negative.   Endo/Heme/Allergies: Negative.   Psychiatric/Behavioral: Negative.       Physical Exam:  height is 5\' 3"  (1.6 m) and weight is 174 lb (78.9 kg). Her oral temperature is 98.6 F (37 C). Her blood pressure is 153/79 (abnormal) and her pulse is 81. Her respiration is 16 and oxygen saturation is 100%.   Wt Readings from Last 3  Encounters:  04/06/22 174 lb (78.9 kg)  03/03/22 172 lb (78 kg)  02/27/22 172 lb (78 kg)    Physical Exam Vitals reviewed.  HENT:     Head: Normocephalic and atraumatic.  Eyes:     Pupils: Pupils are equal, round, and reactive to light.  Cardiovascular:     Rate and Rhythm: Normal rate and regular rhythm.     Heart sounds: Normal heart sounds.  Pulmonary:     Effort: Pulmonary effort is normal.     Breath sounds: Normal breath sounds.  Abdominal:  General: Bowel sounds are normal.     Palpations: Abdomen is soft.  Musculoskeletal:        General: No tenderness or deformity. Normal range of motion.     Cervical back: Normal range of motion.  Lymphadenopathy:     Cervical: No cervical adenopathy.  Skin:    General: Skin is warm and dry.     Findings: No erythema or rash.  Neurological:     Mental Status: She is alert and oriented to person, place, and time.  Psychiatric:        Behavior: Behavior normal.        Thought Content: Thought content normal.        Judgment: Judgment normal.     Lab Results  Component Value Date   WBC 2.4 (L) 04/06/2022   HGB 9.3 (L) 04/06/2022   HCT 29.1 (L) 04/06/2022   MCV 82.9 04/06/2022   PLT 104 (L) 04/06/2022   Lab Results  Component Value Date   FERRITIN 1,897 (H) 02/23/2022   IRON 77 02/23/2022   TIBC 314 02/23/2022   UIBC 237 02/23/2022   IRONPCTSAT 25 02/23/2022   Lab Results  Component Value Date   RETICCTPCT 1.4 02/23/2022   RBC 3.51 (L) 04/06/2022   Lab Results  Component Value Date   KPAFRELGTCHN <0.7 (L) 02/23/2022   LAMBDASER <1.5 (L) 02/23/2022   KAPLAMBRATIO UNABLE TO CALCULATE 02/23/2022   Lab Results  Component Value Date   IGGSERUM 734 02/23/2022   IGA <5 (L) 02/23/2022   IGMSERUM <5 (L) 02/23/2022   Lab Results  Component Value Date   TOTALPROTELP 6.5 02/23/2022   ALBUMINELP 3.8 02/23/2022   A1GS 0.3 02/23/2022   A2GS 0.9 02/23/2022   BETS 1.0 02/23/2022   BETA2SER 0.4 11/23/2014   GAMS  0.7 02/23/2022   MSPIKE Not Observed 02/23/2022   SPEI Comment 10/19/2020     Chemistry      Component Value Date/Time   NA 142 02/23/2022 0840   NA 141 01/10/2017 1115   NA 140 06/21/2016 0918   K 3.2 (L) 02/23/2022 0840   K 4.0 01/10/2017 1115   K 4.3 06/21/2016 0918   CL 104 02/23/2022 0840   CL 106 01/10/2017 1115   CO2 27 02/23/2022 0840   CO2 27 01/10/2017 1115   CO2 20 (L) 06/21/2016 0918   BUN 18 02/23/2022 0840   BUN 15 01/10/2017 1115   BUN 15.8 06/21/2016 0918   CREATININE 0.97 02/23/2022 0840   CREATININE 1.0 01/10/2017 1115   CREATININE 0.8 06/21/2016 0918      Component Value Date/Time   CALCIUM 10.3 02/23/2022 0840   CALCIUM 9.5 01/10/2017 1115   CALCIUM 9.4 06/21/2016 0918   ALKPHOS 43 02/23/2022 0840   ALKPHOS 40 01/10/2017 1115   ALKPHOS 66 06/21/2016 0918   AST 16 02/23/2022 0840   AST 17 06/21/2016 0918   ALT 16 02/23/2022 0840   ALT 19 01/10/2017 1115   ALT 37 06/21/2016 0918   BILITOT 0.4 02/23/2022 0840   BILITOT 0.32 06/21/2016 0918       Impression and Plan: [name] is a very pleasant 72 yo African American female with recurrent lambda light chain myeloma.  She had her second stem cell transplant for light chain myeloma was in June 2016.   I am so happy that the CAR-T therapy is working.  I am absolute amazed as to how low her lambda light chain levels are.  In addition, the fact that  her 24-hour urine is negative is a very good indicator that she is in remission.    On her urinalysis today, this really was unremarkable.  She does have some trace blood in the urine.  There was negative leukocytes.  There were rare bacteria.  We will go ahead and give her Retacrit today.   Josph Macho, MD 3/7/20248:50 AM

## 2022-04-06 NOTE — Patient Instructions (Signed)

## 2022-04-06 NOTE — Patient Instructions (Addendum)

## 2022-04-06 NOTE — Telephone Encounter (Signed)
-----   Message from [name], MD sent at 04/06/2022 12:22 PM EST ----- Call and let him know that his urinalysis looks pretty good.  We are is still awaiting the urine culture but I would think this would be negative.  Thanks.  [name]

## 2022-04-06 NOTE — Progress Notes (Signed)
Give retacrit today per Dr. Marin Olp despite his note

## 2022-04-06 NOTE — Telephone Encounter (Signed)
Pt notified per order of Dr. Marin Olp that the urinalysis "looks pretty good and that we are still awaiting the culture results but Dr. Marin Olp thinks this would be negative".  Pt is appreciative of call and has no questions or concerns at this time.

## 2022-04-07 ENCOUNTER — Telehealth: Payer: Self-pay | Admitting: *Deleted

## 2022-04-07 LAB — KAPPA/LAMBDA LIGHT CHAINS
Kappa free light chain: <0.7 mg/L — ABNORMAL LOW (ref 3.3–19.4)
Kappa, lambda light chain ratio: <0.44 (ref 0.26–1.65)
Lambda free light chains: 1.6 mg/L — ABNORMAL LOW (ref 5.7–26.3)

## 2022-04-07 LAB — URINE CULTURE: Culture: <10000 — AB

## 2022-04-07 NOTE — Telephone Encounter (Signed)
Pt notified per order of Dr. Marin Olp that the urine culture is negative.  Pt is appreciative of call and has no questions or concerns at this time.

## 2022-04-07 NOTE — Telephone Encounter (Signed)
-----   Message from [name], MD sent at 04/07/2022  3:02 PM EST ----- Please call and let her know that the urine culture is negative.  Thanks.  [name]

## 2022-04-08 ENCOUNTER — Other Ambulatory Visit: Payer: Self-pay | Admitting: Internal Medicine

## 2022-04-08 DIAGNOSIS — N390 Urinary tract infection, site not specified: Secondary | ICD-10-CM

## 2022-04-08 DIAGNOSIS — R3129 Other microscopic hematuria: Secondary | ICD-10-CM

## 2022-04-11 ENCOUNTER — Ambulatory Visit
Admission: RE | Admit: 2022-04-11 | Discharge: 2022-04-11 | Disposition: A | Discharge status: Home / Self Care | Payer: Medicare Other | Source: Ambulatory Visit | Attending: Gynecologic Oncology | Admitting: Gynecologic Oncology

## 2022-04-11 DIAGNOSIS — Z780 Asymptomatic menopausal state: Secondary | ICD-10-CM | POA: Diagnosis not present

## 2022-04-11 DIAGNOSIS — Z79811 Long term (current) use of aromatase inhibitors: Secondary | ICD-10-CM

## 2022-04-14 LAB — PROTEIN ELECTROPHORESIS, SERUM, WITH REFLEX
A/G Ratio: 1.4 (ref 0.7–1.7)
Albumin ELP: 3.4 g/dL (ref 2.9–4.4)
Alpha-1-Globulin: 0.2 g/dL (ref 0.0–0.4)
Alpha-2-Globulin: 0.9 g/dL (ref 0.4–1.0)
Beta Globulin: 0.7 g/dL (ref 0.7–1.3)
Gamma Globulin: 0.7 g/dL (ref 0.4–1.8)
Globulin, Total: 2.5 g/dL (ref 2.2–3.9)
M-Spike, %: 0.1 g/dL — ABNORMAL HIGH
SPEP Interpretation: 0
Total Protein ELP: 5.9 g/dL — ABNORMAL LOW (ref 6.0–8.5)

## 2022-04-14 LAB — IGG, IGA, IGM
IgA: <5 mg/dL — ABNORMAL LOW (ref 87–352)
IgG (Immunoglobin G), Serum: 628 mg/dL (ref 586–1602)
IgM (Immunoglobulin M), Srm: <5 mg/dL — ABNORMAL LOW (ref 26–217)

## 2022-04-14 LAB — IMMUNOFIXATION REFLEX, SERUM
IgA: <5 mg/dL — ABNORMAL LOW (ref 87–352)
IgG (Immunoglobin G), Serum: 699 mg/dL (ref 586–1602)
IgM (Immunoglobulin M), Srm: <5 mg/dL — ABNORMAL LOW (ref 26–217)

## 2022-04-18 ENCOUNTER — Other Ambulatory Visit: Payer: Medicare Other

## 2022-04-26 ENCOUNTER — Ambulatory Visit
Admission: RE | Admit: 2022-04-26 | Discharge: 2022-04-26 | Disposition: A | Discharge status: Home / Self Care | Payer: Medicare Other | Source: Ambulatory Visit | Attending: Internal Medicine | Admitting: Internal Medicine

## 2022-04-26 ENCOUNTER — Telehealth: Payer: Self-pay | Admitting: Oncology

## 2022-04-26 DIAGNOSIS — R3129 Other microscopic hematuria: Secondary | ICD-10-CM | POA: Diagnosis not present

## 2022-04-26 DIAGNOSIS — N2889 Other specified disorders of kidney and ureter: Secondary | ICD-10-CM | POA: Diagnosis not present

## 2022-04-26 NOTE — Telephone Encounter (Signed)
Called Emily Greer and notified her of the normal bone density results.  She verbalized understanding and asked if she needs to schedule a follow up soon with Dr. Berline Lopes.  Advised that she can now follow up with her Gyn doctor.  Adree said that her Gyn - Dr. Cecille Po has left so she does not have a Gyn.  She asked if we have any recommendations.

## 2022-05-02 ENCOUNTER — Encounter: Payer: Self-pay | Admitting: Hematology & Oncology

## 2022-05-02 NOTE — Telephone Encounter (Signed)
Called [facility] back and provided her with the contact information for Gyn's: Dr. Hale Bogus and Dr. Paula Compton.

## 2022-05-10 NOTE — Telephone Encounter (Signed)
Error

## 2022-05-11 ENCOUNTER — Other Ambulatory Visit: Payer: Self-pay

## 2022-05-11 ENCOUNTER — Inpatient Hospital Stay: Payer: Medicare Other

## 2022-05-11 ENCOUNTER — Inpatient Hospital Stay (HOSPITAL_BASED_OUTPATIENT_CLINIC_OR_DEPARTMENT_OTHER): Payer: Medicare Other | Admitting: Hematology & Oncology

## 2022-05-11 ENCOUNTER — Encounter: Payer: Self-pay | Admitting: Hematology & Oncology

## 2022-05-11 ENCOUNTER — Inpatient Hospital Stay: Payer: Medicare Other | Attending: Hematology & Oncology

## 2022-05-11 VITALS — BP 145/79 | HR 80 | Temp 98.2°F | Resp 18 | Ht 63.0 in | Wt 175.0 lb

## 2022-05-11 DIAGNOSIS — N2889 Other specified disorders of kidney and ureter: Secondary | ICD-10-CM

## 2022-05-11 DIAGNOSIS — C9000 Multiple myeloma not having achieved remission: Secondary | ICD-10-CM | POA: Insufficient documentation

## 2022-05-11 DIAGNOSIS — Z8542 Personal history of malignant neoplasm of other parts of uterus: Secondary | ICD-10-CM | POA: Insufficient documentation

## 2022-05-11 DIAGNOSIS — Z79899 Other long term (current) drug therapy: Secondary | ICD-10-CM | POA: Diagnosis not present

## 2022-05-11 DIAGNOSIS — M7989 Other specified soft tissue disorders: Secondary | ICD-10-CM | POA: Insufficient documentation

## 2022-05-11 DIAGNOSIS — D509 Iron deficiency anemia, unspecified: Secondary | ICD-10-CM | POA: Insufficient documentation

## 2022-05-11 DIAGNOSIS — Z79811 Long term (current) use of aromatase inhibitors: Secondary | ICD-10-CM | POA: Diagnosis not present

## 2022-05-11 DIAGNOSIS — R350 Frequency of micturition: Secondary | ICD-10-CM

## 2022-05-11 DIAGNOSIS — Z923 Personal history of irradiation: Secondary | ICD-10-CM | POA: Diagnosis not present

## 2022-05-11 DIAGNOSIS — N281 Cyst of kidney, acquired: Secondary | ICD-10-CM | POA: Insufficient documentation

## 2022-05-11 DIAGNOSIS — Z9484 Stem cells transplant status: Secondary | ICD-10-CM | POA: Diagnosis not present

## 2022-05-11 DIAGNOSIS — H538 Other visual disturbances: Secondary | ICD-10-CM | POA: Insufficient documentation

## 2022-05-11 DIAGNOSIS — C9001 Multiple myeloma in remission: Secondary | ICD-10-CM

## 2022-05-11 DIAGNOSIS — Z885 Allergy status to narcotic agent status: Secondary | ICD-10-CM | POA: Insufficient documentation

## 2022-05-11 DIAGNOSIS — C541 Malignant neoplasm of endometrium: Secondary | ICD-10-CM

## 2022-05-11 LAB — CMP (CANCER CENTER ONLY)
ALT: 17 U/L (ref 0–44)
AST: 15 U/L (ref 15–41)
Albumin: 4.2 g/dL (ref 3.5–5.0)
Alkaline Phosphatase: 52 U/L (ref 38–126)
Anion gap: 10 (ref 5–15)
BUN: 16 mg/dL (ref 8–23)
CO2: 26 mmol/L (ref 22–32)
Calcium: 9.4 mg/dL (ref 8.9–10.3)
Chloride: 104 mmol/L (ref 98–111)
Creatinine: 0.91 mg/dL (ref 0.44–1.00)
GFR, Estimated: >60 mL/min (ref >60)
Glucose, Bld: 103 mg/dL — ABNORMAL HIGH (ref 70–99)
Potassium: 3.9 mmol/L (ref 3.5–5.1)
Sodium: 140 mmol/L (ref 135–145)
Total Bilirubin: 0.3 mg/dL (ref 0.3–1.2)
Total Protein: 6.8 g/dL (ref 6.5–8.1)

## 2022-05-11 LAB — CBC WITH DIFFERENTIAL (CANCER CENTER ONLY)
Abs Immature Granulocytes: 0.06 10*3/uL (ref 0.00–0.07)
Basophils Absolute: 0 10*3/uL (ref 0.0–0.1)
Basophils Relative: 1 %
Eosinophils Absolute: 0.1 10*3/uL (ref 0.0–0.5)
Eosinophils Relative: 4 %
HCT: 29.7 % — ABNORMAL LOW (ref 36.0–46.0)
Hemoglobin: 9.5 g/dL — ABNORMAL LOW (ref 12.0–15.0)
Immature Granulocytes: 3 %
Lymphocytes Relative: 15 %
Lymphs Abs: 0.4 10*3/uL — ABNORMAL LOW (ref 0.7–4.0)
MCH: 26.5 pg (ref 26.0–34.0)
MCHC: 32 g/dL (ref 30.0–36.0)
MCV: 82.7 fL (ref 80.0–100.0)
Monocytes Absolute: 0.4 10*3/uL (ref 0.1–1.0)
Monocytes Relative: 17 %
Neutro Abs: 1.4 10*3/uL — ABNORMAL LOW (ref 1.7–7.7)
Neutrophils Relative %: 60 %
Platelet Count: 137 10*3/uL — ABNORMAL LOW (ref 150–400)
RBC: 3.59 MIL/uL — ABNORMAL LOW (ref 3.87–5.11)
RDW: 19.3 % — ABNORMAL HIGH (ref 11.5–15.5)
WBC Count: 2.3 10*3/uL — ABNORMAL LOW (ref 4.0–10.5)
nRBC: 0 % (ref 0.0–0.2)

## 2022-05-11 LAB — LACTATE DEHYDROGENASE: LDH: 240 U/L — ABNORMAL HIGH (ref 98–192)

## 2022-05-11 LAB — RETICULOCYTES
Immature Retic Fract: 22.2 % — ABNORMAL HIGH (ref 2.3–15.9)
RBC.: 3.62 MIL/uL — ABNORMAL LOW (ref 3.87–5.11)
Retic Count, Absolute: 59 10*3/uL (ref 19.0–186.0)
Retic Ct Pct: 1.6 % (ref 0.4–3.1)

## 2022-05-11 LAB — IRON AND IRON BINDING CAPACITY (CC-WL,HP ONLY)
Iron: 88 ug/dL (ref 28–170)
Saturation Ratios: 26 % (ref 10.4–31.8)
TIBC: 339 ug/dL (ref 250–450)
UIBC: 251 ug/dL (ref 148–442)

## 2022-05-11 LAB — FERRITIN: Ferritin: 1610 ng/mL — ABNORMAL HIGH (ref 11–307)

## 2022-05-11 MED ORDER — HEPARIN SOD (PORK) LOCK FLUSH 100 UNIT/ML IV SOLN
500.0000 [IU] | Freq: Once | INTRAVENOUS | Status: AC | PRN
  Administered 2022-05-11: 500 [IU]

## 2022-05-11 MED ORDER — SODIUM CHLORIDE 0.9% FLUSH
10.0000 mL | INTRAVENOUS | Status: DC | PRN
  Administered 2022-05-11: 10 mL

## 2022-05-11 MED ORDER — EPOETIN ALFA-EPBX 40000 UNIT/ML IJ SOLN
40000.0000 [IU] | Freq: Once | INTRAMUSCULAR | Status: AC
  Administered 2022-05-11: 40000 [IU] via SUBCUTANEOUS
  Filled 2022-05-11: qty 1

## 2022-05-11 MED ORDER — DENOSUMAB 120 MG/1.7ML ~~LOC~~ SOLN
120.0000 mg | Freq: Once | SUBCUTANEOUS | Status: AC
  Administered 2022-05-11: 120 mg via SUBCUTANEOUS
  Filled 2022-05-11: qty 1.7

## 2022-05-11 NOTE — Patient Instructions (Signed)

## 2022-05-11 NOTE — Progress Notes (Signed)
She is Hematology and Oncology Follow Up Visit  Emily Greer [id number] 05/21/1950 72 y.o. 05/11/2022   Principle Diagnosis:  Recurrent lambda light chain myeloma - nl cytogenetics History of recurrent endometrial carcinoma Iron deficiency anemia -blood loss   Past Therapy:             Status post second autologous stem cell transplant on 07/24/2014 Maintenance therapy with Pomalidomide/every 2 week Velcade - d/c'ed Radiation therapy for endometrial recurrence - completed 04/20/2015 Pomalyst/Kyprolis 70mg /m2 IV q 2 weeks - s/p cycle #12 - held on 12/26/2017 for hematuria Daratumumab/Pomalyst (1 mg) - status post cycle 19 -- d/c on 08/21/2019 Melflufen 40 mg IV q 4 weeks -- started on 08/27/2019, s/p cycle #2 --  D/c due to FDA removal Selinexor 60 mg po q week -- start on 01/19/2020 -- changed on 03/18/2020 -- d/c on 04/27/2020   Current Therapy:        Blenrep 2.5 mg/m2 IV q 3 weeks -- started on 05/20/2020, s/p cycle #7 --D/C secondary to progression on 12/22/2020 Carfilzomib/Sarclisa -- s/p cycle #3 -- start on 01/03/2021 Femara 2.5 mg po q day  IV iron-Feraheme given on 07/15/2021. Cytoxan/Pomalyst/decadron -- start cycle #1 on 06/15/2021 - d/c 06/2021 S/p Cilta-cel CAR-T infusion -- 07/22/2021 Xgeva 120 mg IM q 3 months -- next dose 07/2022 Retacrit 40,000 units sq q 4 weeks for Hgb < 11 IVIG 40g IV q month -- start on 11/16/2021   Interim History:  Emily Greer is here today for follow-up.  We might be a new problem.  She apparently had a ultrasound of her abdomen.  There is 2 cysts in the left kidney.  There were no all that big.  As such, I think we will going to have to get an MRI to see what might be going on.  Hopefully, these are just cysts and not malignant tumors.  Will set up an MRI next week.   She has had no problem with fever.  There is no cough or shortness of breath..  She still has the anemia.  We did give her Retacrit.  She has had no issues with rashes.   She has little bit of leg swelling.  This is chronic.  She has had no nausea or vomiting.  She has had no headache.  Her son is graduating with a PhD in June.  This is up in [place].  She wants to make sure that she goes to his ceremony.  Overall, I would say that her performance status is probably ECOG 1.   Medications:  Allergies as of 05/11/2022       Reactions   Codeine Nausea Only        Medication List        Accurate as of May 11, 2022  9:13 AM. If you have any questions, ask your nurse or doctor.          acyclovir 400 MG tablet Commonly known as: ZOVIRAX Take 1 tablet (400 mg total) by mouth 2 (two) times daily.   albuterol 108 (90 Base) MCG/ACT inhaler Commonly known as: VENTOLIN HFA Inhale 2 puffs into the lungs every 6 (six) hours as needed for wheezing or shortness of breath. 2 puffs 3 times daily x 5 days then every 6 hours as needed.   amLODipine 10 MG tablet Commonly known as: NORVASC TAKE 1 TABLET(10 MG) BY MOUTH EVERY MORNING   aspirin EC 81 MG tablet Take 81 mg by mouth daily.  betamethasone valerate ointment 0.1 % Commonly known as: VALISONE Apply 1 Application topically daily.   CALCIUM 1200+D3 PO Take 1 tablet by mouth daily.   cephALEXin 500 MG capsule Commonly known as: KEFLEX Take 1 capsule (500 mg total) by mouth 2 (two) times daily.   cetirizine 10 MG tablet Commonly known as: ZYRTEC Take 1 tablet (10 mg total) by mouth at bedtime.   chlorhexidine 0.12 % solution Commonly known as: PERIDEX 10 mLs 2 (two) times daily.   Fluocinolone Acetonide Scalp 0.01 % Oil Apply topically.   hydrochlorothiazide 12.5 MG capsule Commonly known as: MICROZIDE Take 12.5 mg by mouth every morning.   ibuprofen 800 MG tablet Commonly known as: ADVIL Take 800 mg by mouth 3 (three) times daily.   letrozole 2.5 MG tablet Commonly known as: Femara Take 1 tablet (2.5 mg total) by mouth daily.   levETIRAcetam 500 MG tablet Commonly  known as: KEPPRA Take 500 mg by mouth 2 (two) times daily.   metoprolol succinate 50 MG 24 hr tablet Commonly known as: TOPROL-XL TAKE 1 TABLET(50 MG) BY MOUTH EVERY MORNING   montelukast 10 MG tablet Commonly known as: SINGULAIR TAKE 1 TABLET(10 MG) BY MOUTH AT BEDTIME   penicillin v potassium 500 MG tablet Commonly known as: VEETID Take 500 mg by mouth 4 (four) times daily. Prophylaxis dental procedure   potassium chloride SA 20 MEQ tablet Commonly known as: KLOR-CON M Take 1 tablet (20 mEq total) by mouth 2 (two) times daily.   PROBIOTIC DAILY PO Take by mouth daily.   triamcinolone 55 MCG/ACT Aero nasal inhaler Commonly known as: NASACORT Place 2 sprays into the nose as needed.   triamterene-hydrochlorothiazide 37.5-25 MG tablet Commonly known as: MAXZIDE-25 1 tablet by mouth at bedtime   Vitamin D3 50 MCG (2000 UT) Tabs Generic drug: Cholecalciferol Take 2 tablets by mouth daily.   Zinc Gluconate 50 MG Caps Take 1 tablet by mouth daily.        Allergies:  Allergies  Allergen Reactions   Codeine Nausea Only    Past Medical History, Surgical history, Social history, and Family History were reviewed and updated.  Review of Systems: Review of Systems  Constitutional: Negative.   HENT: Negative.    Eyes:  Positive for blurred vision.  Respiratory: Negative.    Cardiovascular: Negative.   Gastrointestinal: Negative.   Genitourinary: Negative.   Musculoskeletal: Negative.   Skin: Negative.   Neurological: Negative.   Endo/Heme/Allergies: Negative.   Psychiatric/Behavioral: Negative.       Physical Exam:  height is 5\' 3"  (1.6 m) and weight is 175 lb (79.4 kg). Her oral temperature is 98.2 F (36.8 C). Her blood pressure is 145/79 (abnormal) and her pulse is 80. Her respiration is 18 and oxygen saturation is 96%.   Wt Readings from Last 3 Encounters:  05/11/22 175 lb (79.4 kg)  04/06/22 174 lb (78.9 kg)  03/03/22 172 lb (78 kg)    Physical  Exam Vitals reviewed.  HENT:     Head: Normocephalic and atraumatic.  Eyes:     Pupils: Pupils are equal, round, and reactive to light.  Cardiovascular:     Rate and Rhythm: Normal rate and regular rhythm.     Heart sounds: Normal heart sounds.  Pulmonary:     Effort: Pulmonary effort is normal.     Breath sounds: Normal breath sounds.  Abdominal:     General: Bowel sounds are normal.     Palpations: Abdomen is soft.  Musculoskeletal:  General: No tenderness or deformity. Normal range of motion.     Cervical back: Normal range of motion.  Lymphadenopathy:     Cervical: No cervical adenopathy.  Skin:    General: Skin is warm and dry.     Findings: No erythema or rash.  Neurological:     Mental Status: She is alert and oriented to person, place, and time.  Psychiatric:        Behavior: Behavior normal.        Thought Content: Thought content normal.        Judgment: Judgment normal.      Lab Results  Component Value Date   WBC 2.3 (L) 05/11/2022   HGB 9.5 (L) 05/11/2022   HCT 29.7 (L) 05/11/2022   MCV 82.7 05/11/2022   PLT 137 (L) 05/11/2022   Lab Results  Component Value Date   FERRITIN 1,642 (H) 04/06/2022   IRON 88 04/06/2022   TIBC 322 04/06/2022   UIBC 234 04/06/2022   IRONPCTSAT 27 04/06/2022   Lab Results  Component Value Date   RETICCTPCT 1.6 05/11/2022   RBC 3.62 (L) 05/11/2022   Lab Results  Component Value Date   KPAFRELGTCHN <0.7 (L) 04/06/2022   LAMBDASER 1.6 (L) 04/06/2022   KAPLAMBRATIO <0.44 04/06/2022   Lab Results  Component Value Date   IGGSERUM 628 04/06/2022   IGGSERUM 699 04/06/2022   IGA <5 (L) 04/06/2022   IGA <5 (L) 04/06/2022   IGMSERUM <5 (L) 04/06/2022   IGMSERUM <5 (L) 04/06/2022   Lab Results  Component Value Date   TOTALPROTELP 5.9 (L) 04/06/2022   ALBUMINELP 3.4 04/06/2022   A1GS 0.2 04/06/2022   A2GS 0.9 04/06/2022   BETS 0.7 04/06/2022   BETA2SER 0.4 11/23/2014   GAMS 0.7 04/06/2022   MSPIKE 0.1 (H)  04/06/2022   SPEI Comment 10/19/2020     Chemistry      Component Value Date/Time   NA 140 05/11/2022 0815   NA 141 01/10/2017 1115   NA 140 06/21/2016 0918   K 3.9 05/11/2022 0815   K 4.0 01/10/2017 1115   K 4.3 06/21/2016 0918   CL 104 05/11/2022 0815   CL 106 01/10/2017 1115   CO2 26 05/11/2022 0815   CO2 27 01/10/2017 1115   CO2 20 (L) 06/21/2016 0918   BUN 16 05/11/2022 0815   BUN 15 01/10/2017 1115   BUN 15.8 06/21/2016 0918   CREATININE 0.91 05/11/2022 0815   CREATININE 1.0 01/10/2017 1115   CREATININE 0.8 06/21/2016 0918      Component Value Date/Time   CALCIUM 9.4 05/11/2022 0815   CALCIUM 9.5 01/10/2017 1115   CALCIUM 9.4 06/21/2016 0918   ALKPHOS 52 05/11/2022 0815   ALKPHOS 40 01/10/2017 1115   ALKPHOS 66 06/21/2016 0918   AST 15 05/11/2022 0815   AST 17 06/21/2016 0918   ALT 17 05/11/2022 0815   ALT 19 01/10/2017 1115   ALT 37 06/21/2016 0918   BILITOT 0.3 05/11/2022 0815   BILITOT 0.32 06/21/2016 0918       Impression and Plan: Ms. Lovecchio is a very pleasant 72 yo African American female with recurrent lambda light chain myeloma.  She had her second stem cell transplant for light chain myeloma was in June 2016.   I am so happy that the CAR-T therapy is working.  I am absolutely amazed as to how low her lambda light chain levels are.  In addition, the fact that her 24-hour urine is negative is  a very good indicator that she is in remission.    I do believe that what is in the left kidney is going to be a cyst and not malignancy.  We will plan to give her the Retacrit and Xgeva today.  We will plan to see her back in another month.  Josph Macho, MD 4/11/20249:13 AM

## 2022-05-11 NOTE — Patient Instructions (Signed)

## 2022-05-12 LAB — KAPPA/LAMBDA LIGHT CHAINS
Kappa free light chain: <0.7 mg/L — ABNORMAL LOW (ref 3.3–19.4)
Kappa, lambda light chain ratio: UNDETERMINED
Lambda free light chains: <1.5 mg/L — ABNORMAL LOW (ref 5.7–26.3)

## 2022-05-12 LAB — IGG, IGA, IGM
IgA: <5 mg/dL — ABNORMAL LOW (ref 87–352)
IgG (Immunoglobin G), Serum: 424 mg/dL — ABNORMAL LOW (ref 586–1602)
IgM (Immunoglobulin M), Srm: <5 mg/dL — ABNORMAL LOW (ref 26–217)

## 2022-05-15 LAB — PROTEIN ELECTROPHORESIS, SERUM, WITH REFLEX
A/G Ratio: 1.5 (ref 0.7–1.7)
Albumin ELP: 3.6 g/dL (ref 2.9–4.4)
Alpha-1-Globulin: 0.2 g/dL (ref 0.0–0.4)
Alpha-2-Globulin: 0.9 g/dL (ref 0.4–1.0)
Beta Globulin: 1 g/dL (ref 0.7–1.3)
Gamma Globulin: 0.3 g/dL — ABNORMAL LOW (ref 0.4–1.8)
Globulin, Total: 2.4 g/dL (ref 2.2–3.9)
Total Protein ELP: 6 g/dL (ref 6.0–8.5)

## 2022-05-18 ENCOUNTER — Ambulatory Visit (HOSPITAL_COMMUNITY)
Admission: RE | Admit: 2022-05-18 | Discharge: 2022-05-18 | Disposition: A | Discharge status: Home / Self Care | Payer: Medicare Other | Source: Ambulatory Visit | Attending: Hematology & Oncology | Admitting: Hematology & Oncology

## 2022-05-18 DIAGNOSIS — N281 Cyst of kidney, acquired: Secondary | ICD-10-CM | POA: Diagnosis not present

## 2022-05-18 DIAGNOSIS — N2889 Other specified disorders of kidney and ureter: Secondary | ICD-10-CM | POA: Diagnosis not present

## 2022-05-18 MED ORDER — GADOBUTROL 1 MMOL/ML IV SOLN
8.0000 mL | Freq: Once | INTRAVENOUS | Status: AC | PRN
  Administered 2022-05-18: 8 mL via INTRAVENOUS

## 2022-05-23 ENCOUNTER — Encounter: Payer: Self-pay | Admitting: *Deleted

## 2022-06-08 ENCOUNTER — Inpatient Hospital Stay (HOSPITAL_BASED_OUTPATIENT_CLINIC_OR_DEPARTMENT_OTHER): Payer: Medicare Other | Admitting: Hematology & Oncology

## 2022-06-08 ENCOUNTER — Inpatient Hospital Stay: Payer: Medicare Other | Attending: Hematology & Oncology

## 2022-06-08 ENCOUNTER — Inpatient Hospital Stay: Payer: Medicare Other

## 2022-06-08 ENCOUNTER — Encounter: Payer: Self-pay | Admitting: Hematology & Oncology

## 2022-06-08 VITALS — BP 142/77 | HR 99 | Temp 98.6°F | Resp 18 | Ht 63.0 in | Wt 173.0 lb

## 2022-06-08 VITALS — BP 134/66 | HR 71 | Resp 17

## 2022-06-08 DIAGNOSIS — Z9484 Stem cells transplant status: Secondary | ICD-10-CM | POA: Insufficient documentation

## 2022-06-08 DIAGNOSIS — Z79811 Long term (current) use of aromatase inhibitors: Secondary | ICD-10-CM | POA: Diagnosis not present

## 2022-06-08 DIAGNOSIS — C9001 Multiple myeloma in remission: Secondary | ICD-10-CM

## 2022-06-08 DIAGNOSIS — Z8542 Personal history of malignant neoplasm of other parts of uterus: Secondary | ICD-10-CM | POA: Diagnosis not present

## 2022-06-08 DIAGNOSIS — C9000 Multiple myeloma not having achieved remission: Secondary | ICD-10-CM

## 2022-06-08 DIAGNOSIS — Z885 Allergy status to narcotic agent status: Secondary | ICD-10-CM | POA: Insufficient documentation

## 2022-06-08 DIAGNOSIS — C541 Malignant neoplasm of endometrium: Secondary | ICD-10-CM

## 2022-06-08 DIAGNOSIS — D509 Iron deficiency anemia, unspecified: Secondary | ICD-10-CM | POA: Diagnosis not present

## 2022-06-08 DIAGNOSIS — Z923 Personal history of irradiation: Secondary | ICD-10-CM | POA: Diagnosis not present

## 2022-06-08 DIAGNOSIS — N2889 Other specified disorders of kidney and ureter: Secondary | ICD-10-CM

## 2022-06-08 DIAGNOSIS — H538 Other visual disturbances: Secondary | ICD-10-CM | POA: Insufficient documentation

## 2022-06-08 LAB — RETICULOCYTES
Immature Retic Fract: 14.3 % (ref 2.3–15.9)
RBC.: 3.83 MIL/uL — ABNORMAL LOW (ref 3.87–5.11)
Retic Count, Absolute: 47.9 10*3/uL (ref 19.0–186.0)
Retic Ct Pct: 1.3 % (ref 0.4–3.1)

## 2022-06-08 LAB — CBC WITH DIFFERENTIAL (CANCER CENTER ONLY)
Abs Immature Granulocytes: 0.27 10*3/uL — ABNORMAL HIGH (ref 0.00–0.07)
Basophils Absolute: 0.1 10*3/uL (ref 0.0–0.1)
Basophils Relative: 2 %
Eosinophils Absolute: 0.1 10*3/uL (ref 0.0–0.5)
Eosinophils Relative: 4 %
HCT: 32.5 % — ABNORMAL LOW (ref 36.0–46.0)
Hemoglobin: 10.1 g/dL — ABNORMAL LOW (ref 12.0–15.0)
Immature Granulocytes: 10 %
Lymphocytes Relative: 14 %
Lymphs Abs: 0.4 10*3/uL — ABNORMAL LOW (ref 0.7–4.0)
MCH: 25.7 pg — ABNORMAL LOW (ref 26.0–34.0)
MCHC: 31.1 g/dL (ref 30.0–36.0)
MCV: 82.7 fL (ref 80.0–100.0)
Monocytes Absolute: 0.4 10*3/uL (ref 0.1–1.0)
Monocytes Relative: 15 %
Neutro Abs: 1.5 10*3/uL — ABNORMAL LOW (ref 1.7–7.7)
Neutrophils Relative %: 55 %
Platelet Count: 142 10*3/uL — ABNORMAL LOW (ref 150–400)
RBC: 3.93 MIL/uL (ref 3.87–5.11)
RDW: 19.2 % — ABNORMAL HIGH (ref 11.5–15.5)
Smear Review: NORMAL
WBC Count: 2.7 10*3/uL — ABNORMAL LOW (ref 4.0–10.5)
nRBC: 0 % (ref 0.0–0.2)

## 2022-06-08 LAB — CMP (CANCER CENTER ONLY)
ALT: 17 U/L (ref 0–44)
AST: 14 U/L — ABNORMAL LOW (ref 15–41)
Albumin: 4.6 g/dL (ref 3.5–5.0)
Alkaline Phosphatase: 49 U/L (ref 38–126)
Anion gap: 11 (ref 5–15)
BUN: 16 mg/dL (ref 8–23)
CO2: 28 mmol/L (ref 22–32)
Calcium: 9.6 mg/dL (ref 8.9–10.3)
Chloride: 104 mmol/L (ref 98–111)
Creatinine: 0.94 mg/dL (ref 0.44–1.00)
GFR, Estimated: >60 mL/min (ref >60)
Glucose, Bld: 117 mg/dL — ABNORMAL HIGH (ref 70–99)
Potassium: 3.4 mmol/L — ABNORMAL LOW (ref 3.5–5.1)
Sodium: 143 mmol/L (ref 135–145)
Total Bilirubin: 0.4 mg/dL (ref 0.3–1.2)
Total Protein: 6.9 g/dL (ref 6.5–8.1)

## 2022-06-08 LAB — FERRITIN: Ferritin: 1387 ng/mL — ABNORMAL HIGH (ref 11–307)

## 2022-06-08 LAB — LACTATE DEHYDROGENASE: LDH: 193 U/L — ABNORMAL HIGH (ref 98–192)

## 2022-06-08 LAB — IRON AND IRON BINDING CAPACITY (CC-WL,HP ONLY)
Iron: 73 ug/dL (ref 28–170)
Saturation Ratios: 22 % (ref 10.4–31.8)
TIBC: 337 ug/dL (ref 250–450)
UIBC: 264 ug/dL (ref 148–442)

## 2022-06-08 MED ORDER — IMMUNE GLOBULIN (HUMAN) 10 GM/100ML IV SOLN
40.0000 g | Freq: Once | INTRAVENOUS | Status: AC
  Administered 2022-06-08: 40 g via INTRAVENOUS
  Filled 2022-06-08: qty 400

## 2022-06-08 MED ORDER — SODIUM CHLORIDE 0.9% FLUSH
10.0000 mL | INTRAVENOUS | Status: DC | PRN
  Administered 2022-06-08: 10 mL

## 2022-06-08 MED ORDER — ACETAMINOPHEN 325 MG PO TABS
650.0000 mg | ORAL_TABLET | Freq: Once | ORAL | Status: AC
  Administered 2022-06-08: 650 mg via ORAL
  Filled 2022-06-08: qty 2

## 2022-06-08 MED ORDER — HEPARIN SOD (PORK) LOCK FLUSH 100 UNIT/ML IV SOLN
500.0000 [IU] | Freq: Once | INTRAVENOUS | Status: AC | PRN
  Administered 2022-06-08: 500 [IU]

## 2022-06-08 MED ORDER — DEXTROSE 5 % IV SOLN: INTRAVENOUS | Status: DC

## 2022-06-08 MED ORDER — DIPHENHYDRAMINE HCL 25 MG PO CAPS
25.0000 mg | ORAL_CAPSULE | Freq: Once | ORAL | Status: AC
  Administered 2022-06-08: 25 mg via ORAL
  Filled 2022-06-08: qty 1

## 2022-06-08 MED ORDER — EPOETIN ALFA-EPBX 40000 UNIT/ML IJ SOLN
40000.0000 [IU] | Freq: Once | INTRAMUSCULAR | Status: AC
  Administered 2022-06-08: 40000 [IU] via SUBCUTANEOUS
  Filled 2022-06-08: qty 1

## 2022-06-08 NOTE — Progress Notes (Signed)
Pt declined to stay for post infusion observation period. Pt stated she has tolerated medication multiple times prior without difficulty. Pt aware to call clinic with any questions or concerns. Pt verbalized understanding and had no further questions.  ? ?

## 2022-06-08 NOTE — Patient Instructions (Signed)

## 2022-06-08 NOTE — Progress Notes (Signed)
She is Hematology and Oncology Follow Up Visit  Emily Greer [id number] 12/31/1950 72 y.o. 06/08/2022   Principle Diagnosis:  Recurrent lambda light chain myeloma - nl cytogenetics History of recurrent endometrial carcinoma Iron deficiency anemia -blood loss   Past Therapy:             Status post second autologous stem cell transplant on 07/24/2014 Maintenance therapy with Pomalidomide/every 2 week Velcade - d/c'ed Radiation therapy for endometrial recurrence - completed 04/20/2015 Pomalyst/Kyprolis 70mg /m2 IV q 2 weeks - s/p cycle #12 - held on 12/26/2017 for hematuria Daratumumab/Pomalyst (1 mg) - status post cycle 19 -- d/c on 08/21/2019 Melflufen 40 mg IV q 4 weeks -- started on 08/27/2019, s/p cycle #2 --  D/c due to FDA removal Selinexor 60 mg po q week -- start on 01/19/2020 -- changed on 03/18/2020 -- d/c on 04/27/2020   Current Therapy:        Blenrep 2.5 mg/m2 IV q 3 weeks -- started on 05/20/2020, s/p cycle #7 --D/C secondary to progression on 12/22/2020 Carfilzomib/Sarclisa -- s/p cycle #3 -- start on 01/03/2021 Femara 2.5 mg po q day  IV iron-Feraheme given on 07/15/2021. Cytoxan/Pomalyst/decadron -- start cycle #1 on 06/15/2021 - d/c 06/2021 S/p Cilta-cel CAR-T infusion -- 07/22/2021 Xgeva 120 mg IM q 3 months -- next dose 07/2022 Retacrit 40,000 units sq q 4 weeks for Hgb < 11 IVIG 40g IV q month -- start on 11/16/2021   Interim History:  Emily Greer is here today for follow-up.  She is doing quite nicely.  She feels well.  I know she will have a wonderful Mothers Day weekend.  When I saw her, there was some concern about the possibility of an issue with her kidney with tumor.  She had MRI that was done on 05/18/2022.  The MRI did not show anything that was suspicious with the left kidney.  There is some simple renal cysts.  She has had no problem with infections.  She has had no problems with bleeding.  She has had no problems with cough or shortness of  breath.  Is now been almost a year that she underwent her CAR-T infusion.  She has had no problems with leg swelling.  There is been no rashes.  She has had no change in bowel or bladder habits.  Her last iron studies that were done showed a ferritin of 1600 with an iron saturation of 26%.  I suspect that the high ferritin is secondary to inflammation.  Overall, I would have to say that her performance status is probably ECOG 1.     Medications:  Allergies as of 06/08/2022       Reactions   Codeine Nausea Only        Medication List        Accurate as of Jun 08, 2022  8:38 AM. If you have any questions, ask your nurse or doctor.          STOP taking these medications    cephALEXin 500 MG capsule Commonly known as: KEFLEX Stopped by: Josph Macho, MD   Fluocinolone Acetonide Scalp 0.01 % Oil Stopped by: Josph Macho, MD   PROBIOTIC DAILY PO Stopped by: Josph Macho, MD       TAKE these medications    acyclovir 400 MG tablet Commonly known as: ZOVIRAX Take 1 tablet (400 mg total) by mouth 2 (two) times daily.   albuterol 108 (90 Base) MCG/ACT inhaler Commonly known as: VENTOLIN  HFA Inhale 2 puffs into the lungs every 6 (six) hours as needed for wheezing or shortness of breath. 2 puffs 3 times daily x 5 days then every 6 hours as needed.   amLODipine 10 MG tablet Commonly known as: NORVASC TAKE 1 TABLET(10 MG) BY MOUTH EVERY MORNING   aspirin EC 81 MG tablet Take 81 mg by mouth daily.   betamethasone valerate ointment 0.1 % Commonly known as: VALISONE Apply 1 Application topically daily.   CALCIUM 1200+D3 PO Take 1 tablet by mouth daily.   cetirizine 10 MG tablet Commonly known as: ZYRTEC Take 1 tablet (10 mg total) by mouth at bedtime.   chlorhexidine 0.12 % solution Commonly known as: PERIDEX 10 mLs 2 (two) times daily.   hydrochlorothiazide 12.5 MG capsule Commonly known as: MICROZIDE Take 12.5 mg by mouth every morning.    ibuprofen 800 MG tablet Commonly known as: ADVIL Take 800 mg by mouth 3 (three) times daily.   letrozole 2.5 MG tablet Commonly known as: Femara Take 1 tablet (2.5 mg total) by mouth daily.   levETIRAcetam 500 MG tablet Commonly known as: KEPPRA Take 500 mg by mouth 2 (two) times daily.   metoprolol succinate 50 MG 24 hr tablet Commonly known as: TOPROL-XL TAKE 1 TABLET(50 MG) BY MOUTH EVERY MORNING   montelukast 10 MG tablet Commonly known as: SINGULAIR TAKE 1 TABLET(10 MG) BY MOUTH AT BEDTIME   penicillin v potassium 500 MG tablet Commonly known as: VEETID Take 500 mg by mouth 4 (four) times daily. Prophylaxis dental procedure   potassium chloride SA 20 MEQ tablet Commonly known as: KLOR-CON M Take 1 tablet (20 mEq total) by mouth 2 (two) times daily.   triamcinolone 55 MCG/ACT Aero nasal inhaler Commonly known as: NASACORT Place 2 sprays into the nose as needed.   triamterene-hydrochlorothiazide 37.5-25 MG tablet Commonly known as: MAXZIDE-25 1 tablet by mouth at bedtime   Vitamin D3 50 MCG (2000 UT) Tabs Take 2 tablets by mouth daily.   Zinc Gluconate 50 MG Caps Take 1 tablet by mouth daily.        Allergies:  Allergies  Allergen Reactions   Codeine Nausea Only    Past Medical History, Surgical history, Social history, and Family History were reviewed and updated.  Review of Systems: Review of Systems  Constitutional: Negative.   HENT: Negative.    Eyes:  Positive for blurred vision.  Respiratory: Negative.    Cardiovascular: Negative.   Gastrointestinal: Negative.   Genitourinary: Negative.   Musculoskeletal: Negative.   Skin: Negative.   Neurological: Negative.   Endo/Heme/Allergies: Negative.   Psychiatric/Behavioral: Negative.       Physical Exam:  height is 5\' 3"  (1.6 m) and weight is 173 lb (78.5 kg). Her oral temperature is 98.6 F (37 C). Her blood pressure is 142/77 (abnormal) and her pulse is 99. Her respiration is 18 and oxygen  saturation is 97%.   Wt Readings from Last 3 Encounters:  06/08/22 173 lb (78.5 kg)  05/11/22 175 lb (79.4 kg)  04/06/22 174 lb (78.9 kg)    Physical Exam Vitals reviewed.  HENT:     Head: Normocephalic and atraumatic.  Eyes:     Pupils: Pupils are equal, round, and reactive to light.  Cardiovascular:     Rate and Rhythm: Normal rate and regular rhythm.     Heart sounds: Normal heart sounds.  Pulmonary:     Effort: Pulmonary effort is normal.     Breath sounds: Normal breath sounds.  Abdominal:     General: Bowel sounds are normal.     Palpations: Abdomen is soft.  Musculoskeletal:        General: No tenderness or deformity. Normal range of motion.     Cervical back: Normal range of motion.  Lymphadenopathy:     Cervical: No cervical adenopathy.  Skin:    General: Skin is warm and dry.     Findings: No erythema or rash.  Neurological:     Mental Status: She is alert and oriented to person, place, and time.  Psychiatric:        Behavior: Behavior normal.        Thought Content: Thought content normal.        Judgment: Judgment normal.     Lab Results  Component Value Date   WBC 2.3 (L) 05/11/2022   HGB 9.5 (L) 05/11/2022   HCT 29.7 (L) 05/11/2022   MCV 82.7 05/11/2022   PLT 137 (L) 05/11/2022   Lab Results  Component Value Date   FERRITIN 1,610 (H) 05/11/2022   IRON 88 05/11/2022   TIBC 339 05/11/2022   UIBC 251 05/11/2022   IRONPCTSAT 26 05/11/2022   Lab Results  Component Value Date   RETICCTPCT 1.6 05/11/2022   RBC 3.62 (L) 05/11/2022   Lab Results  Component Value Date   KPAFRELGTCHN <0.7 (L) 05/11/2022   LAMBDASER <1.5 (L) 05/11/2022   KAPLAMBRATIO UNABLE TO CALCULATE 05/11/2022   Lab Results  Component Value Date   IGGSERUM 424 (L) 05/11/2022   IGA <5 (L) 05/11/2022   IGMSERUM <5 (L) 05/11/2022   Lab Results  Component Value Date   TOTALPROTELP 6.0 05/11/2022   ALBUMINELP 3.6 05/11/2022   A1GS 0.2 05/11/2022   A2GS 0.9 05/11/2022    BETS 1.0 05/11/2022   BETA2SER 0.4 11/23/2014   GAMS 0.3 (L) 05/11/2022   MSPIKE Not Observed 05/11/2022   SPEI Comment 10/19/2020     Chemistry      Component Value Date/Time   NA 140 05/11/2022 0815   NA 141 01/10/2017 1115   NA 140 06/21/2016 0918   K 3.9 05/11/2022 0815   K 4.0 01/10/2017 1115   K 4.3 06/21/2016 0918   CL 104 05/11/2022 0815   CL 106 01/10/2017 1115   CO2 26 05/11/2022 0815   CO2 27 01/10/2017 1115   CO2 20 (L) 06/21/2016 0918   BUN 16 05/11/2022 0815   BUN 15 01/10/2017 1115   BUN 15.8 06/21/2016 0918   CREATININE 0.91 05/11/2022 0815   CREATININE 1.0 01/10/2017 1115   CREATININE 0.8 06/21/2016 0918      Component Value Date/Time   CALCIUM 9.4 05/11/2022 0815   CALCIUM 9.5 01/10/2017 1115   CALCIUM 9.4 06/21/2016 0918   ALKPHOS 52 05/11/2022 0815   ALKPHOS 40 01/10/2017 1115   ALKPHOS 66 06/21/2016 0918   AST 15 05/11/2022 0815   AST 17 06/21/2016 0918   ALT 17 05/11/2022 0815   ALT 19 01/10/2017 1115   ALT 37 06/21/2016 0918   BILITOT 0.3 05/11/2022 0815   BILITOT 0.32 06/21/2016 0918       Impression and Plan: Ms. Bacak is a very pleasant 72 yo African American female with recurrent lambda light chain myeloma.  She had her second stem cell transplant for light chain myeloma was in June 2016.   Thankfully, there is no problem with her kidney.  Happy that her hemoglobin is going up a little bit.  I think the Retacrit is  helping.  We will see what her iron studies are today.  So far, there is no evidence of recurrent or even residual myeloma.  We will have to see what her IgG level is.  I would like to plan to get her back in about 6 weeks now.  I think we can probably try to move her appointments out a little bit longer.  Josph Macho, MD 5/9/20248:38 AM

## 2022-06-09 LAB — KAPPA/LAMBDA LIGHT CHAINS
Kappa free light chain: <0.7 mg/L — ABNORMAL LOW (ref 3.3–19.4)
Kappa, lambda light chain ratio: UNDETERMINED
Lambda free light chains: <1.5 mg/L — ABNORMAL LOW (ref 5.7–26.3)

## 2022-06-10 LAB — IGG, IGA, IGM
IgA: <5 mg/dL — ABNORMAL LOW (ref 87–352)
IgG (Immunoglobin G), Serum: 343 mg/dL — ABNORMAL LOW (ref 586–1602)
IgM (Immunoglobulin M), Srm: <5 mg/dL — ABNORMAL LOW (ref 26–217)

## 2022-06-12 NOTE — Telephone Encounter (Signed)
error 

## 2022-06-13 LAB — PROTEIN ELECTROPHORESIS, SERUM, WITH REFLEX
A/G Ratio: 1.4 (ref 0.7–1.7)
Albumin ELP: 3.6 g/dL (ref 2.9–4.4)
Alpha-1-Globulin: 0.2 g/dL (ref 0.0–0.4)
Alpha-2-Globulin: 1 g/dL (ref 0.4–1.0)
Beta Globulin: 1 g/dL (ref 0.7–1.3)
Gamma Globulin: 0.3 g/dL — ABNORMAL LOW (ref 0.4–1.8)
Globulin, Total: 2.6 g/dL (ref 2.2–3.9)
Total Protein ELP: 6.2 g/dL (ref 6.0–8.5)

## 2022-06-28 ENCOUNTER — Inpatient Hospital Stay: Payer: Medicare Other

## 2022-06-28 ENCOUNTER — Other Ambulatory Visit: Payer: Self-pay | Admitting: *Deleted

## 2022-06-28 VITALS — BP 153/72 | HR 72 | Temp 99.1°F | Resp 18

## 2022-06-28 DIAGNOSIS — Z8542 Personal history of malignant neoplasm of other parts of uterus: Secondary | ICD-10-CM | POA: Diagnosis not present

## 2022-06-28 DIAGNOSIS — C9001 Multiple myeloma in remission: Secondary | ICD-10-CM

## 2022-06-28 DIAGNOSIS — C9000 Multiple myeloma not having achieved remission: Secondary | ICD-10-CM

## 2022-06-28 DIAGNOSIS — H538 Other visual disturbances: Secondary | ICD-10-CM | POA: Diagnosis not present

## 2022-06-28 DIAGNOSIS — C541 Malignant neoplasm of endometrium: Secondary | ICD-10-CM

## 2022-06-28 DIAGNOSIS — Z79811 Long term (current) use of aromatase inhibitors: Secondary | ICD-10-CM | POA: Diagnosis not present

## 2022-06-28 DIAGNOSIS — D509 Iron deficiency anemia, unspecified: Secondary | ICD-10-CM | POA: Diagnosis not present

## 2022-06-28 DIAGNOSIS — Z923 Personal history of irradiation: Secondary | ICD-10-CM | POA: Diagnosis not present

## 2022-06-28 DIAGNOSIS — Z95828 Presence of other vascular implants and grafts: Secondary | ICD-10-CM

## 2022-06-28 LAB — CBC WITH DIFFERENTIAL (CANCER CENTER ONLY)
Abs Immature Granulocytes: 0.07 10*3/uL (ref 0.00–0.07)
Basophils Absolute: 0 10*3/uL (ref 0.0–0.1)
Basophils Relative: 2 %
Eosinophils Absolute: 0.1 10*3/uL (ref 0.0–0.5)
Eosinophils Relative: 3 %
HCT: 31 % — ABNORMAL LOW (ref 36.0–46.0)
Hemoglobin: 9.6 g/dL — ABNORMAL LOW (ref 12.0–15.0)
Immature Granulocytes: 3 %
Lymphocytes Relative: 17 %
Lymphs Abs: 0.4 10*3/uL — ABNORMAL LOW (ref 0.7–4.0)
MCH: 25.6 pg — ABNORMAL LOW (ref 26.0–34.0)
MCHC: 31 g/dL (ref 30.0–36.0)
MCV: 82.7 fL (ref 80.0–100.0)
Monocytes Absolute: 0.4 10*3/uL (ref 0.1–1.0)
Monocytes Relative: 14 %
Neutro Abs: 1.6 10*3/uL — ABNORMAL LOW (ref 1.7–7.7)
Neutrophils Relative %: 61 %
Platelet Count: 122 10*3/uL — ABNORMAL LOW (ref 150–400)
RBC: 3.75 MIL/uL — ABNORMAL LOW (ref 3.87–5.11)
RDW: 18.6 % — ABNORMAL HIGH (ref 11.5–15.5)
WBC Count: 2.5 10*3/uL — ABNORMAL LOW (ref 4.0–10.5)
nRBC: 0 % (ref 0.0–0.2)

## 2022-06-28 MED ORDER — HEPARIN SOD (PORK) LOCK FLUSH 100 UNIT/ML IV SOLN
500.0000 [IU] | Freq: Once | INTRAVENOUS | Status: AC
  Administered 2022-06-28: 500 [IU] via INTRAVENOUS

## 2022-06-28 MED ORDER — EPOETIN ALFA-EPBX 40000 UNIT/ML IJ SOLN
40000.0000 [IU] | Freq: Once | INTRAMUSCULAR | Status: AC
  Administered 2022-06-28: 40000 [IU] via SUBCUTANEOUS
  Filled 2022-06-28: qty 1

## 2022-06-28 MED ORDER — SODIUM CHLORIDE 0.9% FLUSH
10.0000 mL | Freq: Once | INTRAVENOUS | Status: AC
  Administered 2022-06-28: 10 mL via INTRAVENOUS

## 2022-06-28 NOTE — Patient Instructions (Signed)

## 2022-06-28 NOTE — Patient Instructions (Signed)

## 2022-06-29 ENCOUNTER — Inpatient Hospital Stay: Payer: Medicare Other

## 2022-06-29 ENCOUNTER — Ambulatory Visit: Payer: Medicare Other

## 2022-07-03 ENCOUNTER — Other Ambulatory Visit: Payer: Self-pay

## 2022-07-03 ENCOUNTER — Other Ambulatory Visit: Payer: Self-pay | Admitting: Internal Medicine

## 2022-07-03 DIAGNOSIS — H40033 Anatomical narrow angle, bilateral: Secondary | ICD-10-CM | POA: Diagnosis not present

## 2022-07-03 DIAGNOSIS — H43812 Vitreous degeneration, left eye: Secondary | ICD-10-CM | POA: Diagnosis not present

## 2022-07-03 DIAGNOSIS — C9000 Multiple myeloma not having achieved remission: Secondary | ICD-10-CM

## 2022-07-03 DIAGNOSIS — I10 Essential (primary) hypertension: Secondary | ICD-10-CM

## 2022-07-03 DIAGNOSIS — Z961 Presence of intraocular lens: Secondary | ICD-10-CM | POA: Diagnosis not present

## 2022-07-03 NOTE — Progress Notes (Signed)
Emily Greer 1950/03/14 [id number]  Patient outreached by Seward Meth , PharmD Candidate on 06/02/22.  Blood Pressure Readings: Last documented ambulatory systolic blood pressure: 153 Last documented ambulatory diastolic blood pressure: 72 Does the patient have a validated home blood pressure machine?: Yes They report home readings: Pt does not check her blood pressure -- informed her on the importance of checking and documenting readings   Medication review was performed. Is the patient taking their medications as prescribed?: No Differences from their prescribed list include: Pt is confused about which medications she should currently be taking. She states she has multiple doctors telling her which medications to take and she cannot keep up with the changes. She stated she is taking bactrim and penicillin and when I informed her those are antibiotics she said she did not know if she should be on them.   The following barriers to adherence were noted: Does the patient have cost concerns?: No Does the patient have transportation concerns?: No Does the patient need assistance obtaining refills?: No Does the patient occassionally forget to take some of their prescribed medications?: Yes Does the patient feel like one/some of their medications make them feel poorly?: No Does the patient have questions or concerns about their medications?: Yes Does the patient have a follow up scheduled with their primary care provider/cardiologist?: Yes  Patient is running low on her letrozole and needs a refill as she was told she would have to be on it for life but her prescriber has since passed.   Interventions: Interventions Completed: Medications were reviewed, Patient was educated on goal blood pressures and long term health implications of elevated blood pressure  The patient has follow up scheduled:  PCP: [name], MD   Seward Meth, Student-PharmD

## 2022-07-04 ENCOUNTER — Ambulatory Visit (INDEPENDENT_AMBULATORY_CARE_PROVIDER_SITE_OTHER): Payer: Medicare Other | Admitting: Internal Medicine

## 2022-07-04 ENCOUNTER — Encounter: Payer: Self-pay | Admitting: Internal Medicine

## 2022-07-04 VITALS — BP 130/82 | HR 77 | Temp 98.5°F | Ht 63.0 in | Wt 174.8 lb

## 2022-07-04 DIAGNOSIS — I10 Essential (primary) hypertension: Secondary | ICD-10-CM | POA: Diagnosis not present

## 2022-07-04 DIAGNOSIS — C9000 Multiple myeloma not having achieved remission: Secondary | ICD-10-CM | POA: Diagnosis not present

## 2022-07-04 DIAGNOSIS — L659 Nonscarring hair loss, unspecified: Secondary | ICD-10-CM | POA: Diagnosis not present

## 2022-07-04 DIAGNOSIS — E6609 Other obesity due to excess calories: Secondary | ICD-10-CM | POA: Diagnosis not present

## 2022-07-04 DIAGNOSIS — C541 Malignant neoplasm of endometrium: Secondary | ICD-10-CM

## 2022-07-04 DIAGNOSIS — Z6830 Body mass index (BMI) 30.0-30.9, adult: Secondary | ICD-10-CM

## 2022-07-04 NOTE — Progress Notes (Signed)
I,Victoria T Hamilton,acting as a scribe for Gwynneth Aliment, MD.,have documented all relevant documentation on the behalf of Gwynneth Aliment, MD,as directed by  Gwynneth Aliment, MD while in the presence of Gwynneth Aliment, MD.    Subjective:     Patient ID: Emily Greer , female    DOB: 1950/10/28 , 72 y.o.   MRN: [id number]   Chief Complaint  Patient presents with   Hypertension    HPI  She presents today for BP f/u. She reports compliance with meds. Denies headache, chest pain, and SOB. She has brought in all of her meds. She reports being confused about her meds.  Denies headaches, chest pain and shortness of breath.   Hypertension This is a chronic problem. The current episode started more than 1 year ago. The problem has been gradually improving since onset. The problem is controlled. Pertinent negatives include no blurred vision. Past treatments include beta blockers and diuretics. The current treatment provides moderate improvement. Compliance problems include exercise.  There is no history of pheochromocytoma or renovascular disease.  Leg Pain      Past Medical History:  Diagnosis Date   Avascular necrosis of femoral head (HCC)    bilateral per CT 07-26-2015   Endometrial carcinoma [facility]) gyn oncologist-  dr Nelly Rout ([facility])/  radiation oncologist-- dr Roselind Messier   2013 dx  FIGO Stage 1A, Grade 2 endometrioid endometrial cancer s/p TAH w/ BSO and bilateral pelvic node dissection 10-31-2011 ;  recurrence at distal vagina 04/ 2014 s/p  brachytherapy (ended 07-29-2012);  2nd recurrence 12/ 2016  vaginal apex s/p  conformational radiotherapy 03-10-2015 to 04-20-2015   Family history of adverse reaction to anesthesia    mother ponv   GERD (gastroesophageal reflux disease)    Goals of care, counseling/discussion 08/21/2019   H/O stem cell transplant (HCC)    02/ 2000 and second one 06/ 2016   History of bacteremia    staphyloccus epidemidis bacteremia in 1999 and  05/ 2016   History of chemotherapy    last chemo 12-26-17   History of radiation therapy 6/4, 6/11, 6/19, 6/25, 07/29/2012   vagina 30.5 gray in 5 fx, HDR brachytherapy:   last radiation to vagina 03-10-2015 to 04-20-2015  50.4gray   History of radiation therapy 03/10/15-04/20/15   vagina 50.4 in 28 fractions   Hypertension    Lambda light chain myeloma [facility]) oncologist-  dr Myna Hidalgo ([facility])  and  [facility] -- dr Silvestre Mesi gasparetto   dx 07/ 1999 s/p  VAD chemotherapy 11/ 1999,  purged autotransplant 03-21-1998 followed by auto stem cell transplant 03-29-1998;  recurrance w/ second autologous stem cell transplant 07-24-2014;  in Re-mission currently , chemo maintenance therapy   Osteoporosis 12/18/05   Increased  risk    PONV (postoperative nausea and vomiting)    Wears glasses      Family History  Problem Relation Age of Onset   Colon cancer Mother    Hypertension Father    Heart Problems Father      Current Outpatient Medications:    acyclovir (ZOVIRAX) 400 MG tablet, Take 1 tablet (400 mg total) by mouth 2 (two) times daily., Disp: 60 tablet, Rfl: 6   amLODipine (NORVASC) 10 MG tablet, TAKE 1 TABLET(10 MG) BY MOUTH EVERY MORNING, Disp: 90 tablet, Rfl: 1   aspirin EC 81 MG tablet, Take 81 mg by mouth daily., Disp: , Rfl:    calcium-vitamin D (OSCAL WITH D) 500-5 MG-MCG tablet, Take 1  tablet by mouth daily., Disp: , Rfl:    chlorhexidine (PERIDEX) 0.12 % solution, 10 mLs 2 (two) times daily., Disp: , Rfl:    Cholecalciferol (VITAMIN D3) 2000 UNITS TABS, Take 2 tablets by mouth daily., Disp: , Rfl:    letrozole (FEMARA) 2.5 MG tablet, Take 1 tablet (2.5 mg total) by mouth daily., Disp: 30 tablet, Rfl: 12   levETIRAcetam (KEPPRA) 500 MG tablet, Take 500 mg by mouth 2 (two) times daily., Disp: , Rfl:    potassium chloride SA (KLOR-CON M) 20 MEQ tablet, Take 1 tablet (20 mEq total) by mouth 2 (two) times daily., Disp: 60 tablet, Rfl: 3   Zinc Gluconate 50 MG CAPS, Take 1 tablet by  mouth daily., Disp: , Rfl:    albuterol (VENTOLIN HFA) 108 (90 Base) MCG/ACT inhaler, Inhale 2 puffs into the lungs every 6 (six) hours as needed for wheezing or shortness of breath. 2 puffs 3 times daily x 5 days then every 6 hours as needed. (Patient not taking: Reported on 02/23/2022), Disp: 18 g, Rfl: 11   betamethasone valerate ointment (VALISONE) 0.1 %, Apply 1 Application topically daily. (Patient not taking: Reported on 06/08/2022), Disp: , Rfl:    cetirizine (ZYRTEC) 10 MG tablet, Take 1 tablet (10 mg total) by mouth at bedtime. (Patient not taking: Reported on 06/08/2022), Disp: 90 tablet, Rfl: 1   ibuprofen (ADVIL) 800 MG tablet, Take 800 mg by mouth 3 (three) times daily. (Patient not taking: Reported on 02/23/2022), Disp: , Rfl:    metoprolol tartrate (LOPRESSOR) 50 MG tablet, Take 1/2 tablet by mouth one time daily., Disp: 30 tablet, Rfl: 1   penicillin v potassium (VEETID) 500 MG tablet, Take 500 mg by mouth 4 (four) times daily. Prophylaxis dental procedure (Patient not taking: Reported on 01/19/2022), Disp: , Rfl:    sulfamethoxazole-trimethoprim (BACTRIM) 400-80 MG tablet, Take 1 tablet by mouth once. (Patient not taking: Reported on 07/04/2022), Disp: , Rfl:    triamcinolone (NASACORT) 55 MCG/ACT AERO nasal inhaler, Place 2 sprays into the nose as needed. (Patient not taking: Reported on 06/08/2022), Disp: , Rfl:  No current facility-administered medications for this visit.  Facility-Administered Medications Ordered in Other Visits:    0.9 %  sodium chloride infusion, , Intravenous, Once PRN, Ennever, Rose Phi, MD   albuterol (PROVENTIL) (2.5 MG/3ML) 0.083% nebulizer solution 2.5 mg, 2.5 mg, Nebulization, Once PRN, Ennever, Rose Phi, MD   sodium chloride flush (NS) 0.9 % injection 10 mL, 10 mL, Intravenous, PRN, Erenest Blank, NP, 10 mL at 12/26/17 1130   sodium chloride flush (NS) 0.9 % injection 10 mL, 10 mL, Intravenous, PRN, Josph Macho, MD, 10 mL at 04/24/19 1305   sodium chloride  flush (NS) 0.9 % injection 10 mL, 10 mL, Intravenous, PRN, Josph Macho, MD, 10 mL at 08/21/19 1233   sodium chloride flush (NS) 0.9 % injection 10 mL, 10 mL, Intracatheter, PRN, Erenest Blank, NP, 10 mL at 10/01/19 1053   sodium chloride flush (NS) 0.9 % injection 10 mL, 10 mL, Intracatheter, PRN, Erenest Blank, NP, 10 mL at 12/11/19 1414   sodium chloride flush (NS) 0.9 % injection 10 mL, 10 mL, Intravenous, PRN, Erenest Blank, NP, 10 mL at 07/06/21 1105   sodium chloride flush (NS) 0.9 % injection 10 mL, 10 mL, Intracatheter, PRN, Erenest Blank, NP, 10 mL at 07/14/21 0931   Allergies  Allergen Reactions   Codeine Nausea Only     Review of Systems  Constitutional: Negative.   Eyes:  Negative for blurred vision.  Respiratory: Negative.    Cardiovascular: Negative.   Gastrointestinal: Negative.   Musculoskeletal: Negative.   Neurological: Negative.   Psychiatric/Behavioral: Negative.       Today's Vitals   07/04/22 1148 07/04/22 1206  BP: (!) 150/82 130/82  Pulse: 77   Temp: 98.5 F (36.9 C)   SpO2: 98%   Weight: 174 lb 12.8 oz (79.3 kg)   Height: 5\' 3"  (1.6 m)    Wt Readings from Last 3 Encounters:  07/07/22 174 lb (78.9 kg)  07/04/22 174 lb 12.8 oz (79.3 kg)  06/08/22 173 lb (78.5 kg)   Wt Readings from Last 3 Encounters:  07/07/22 174 lb (78.9 kg)  07/04/22 174 lb 12.8 oz (79.3 kg)  06/08/22 173 lb (78.5 kg)    Body mass index is 30.96 kg/m.  The ASCVD Risk score (Arnett DK, et al., 2019) failed to calculate for the following reasons:   Cannot find a previous HDL lab   Cannot find a previous total cholesterol lab ++ Objective:  Physical Exam Vitals and nursing note reviewed.  Constitutional:      Appearance: Normal appearance.  HENT:     Head: Normocephalic and atraumatic.  Eyes:     Extraocular Movements: Extraocular movements intact.  Cardiovascular:     Rate and Rhythm: Normal rate and regular rhythm.     Heart sounds: Normal heart sounds.   Pulmonary:     Effort: Pulmonary effort is normal.     Breath sounds: Normal breath sounds.  Musculoskeletal:     Cervical back: Normal range of motion.  Skin:    General: Skin is warm.  Neurological:     General: No focal deficit present.     Mental Status: She is alert.  Psychiatric:        Mood and Affect: Mood normal.        Behavior: Behavior normal.         Assessment And Plan:     1. Essential (primary) hypertension Comments: Chronic, we reviewed each of her meds in full detail. She had several expired meds in her bag, will c/w amlodipine 10mg  nightly. F/u 72 hrs for NV. - BMP8+EGFR  2. Hair loss Comments: Chronic, I will check labs as below. I will also refer her to Derm for further evaluation. - Ambulatory referral to Dermatology - ANA, IFA (with reflex) - Iron, TIBC and Ferritin Panel - TSH  3. Endometrial cancer (HCC) Comments: Has h/o recurrent endometrial cancer. She is currently on Femara.  I will refer her to GYN for yearly pelvic exams, prefers to see GYN instead of me for this. - Ambulatory referral to Obstetrics / Gynecology  4. Multiple myeloma not having achieved remission (HCC) Comments: Chronic, Oncology note reviewed. She is s/p second stem cell transplant.  5. Class 1 obesity due to excess calories with serious comorbidity and body mass index (BMI) of 30.0 to 30.9 in adult Comments: She is encouraged to aim for at least 150 minutes of exercicse/week, while striving for BMI<30 to decrease cardiac risk.   Return in 3 days (on 07/07/2022), or nurse visit, Friday after 2pm.  Patient was given opportunity to ask questions. Patient verbalized understanding of the plan and was able to repeat key elements of the plan. All questions were answered to their satisfaction.    I, Gwynneth Aliment, MD, have reviewed all documentation for this visit. The documentation on 07/04/22 for the exam, diagnosis, procedures,  and orders are all accurate and complete.   IF  YOU HAVE BEEN REFERRED TO A SPECIALIST, IT MAY TAKE 1-2 WEEKS TO SCHEDULE/PROCESS THE REFERRAL. IF YOU HAVE NOT HEARD FROM US/SPECIALIST IN TWO WEEKS, PLEASE GIVE [place] A CALL AT [phone] X [phone].   THE PATIENT IS ENCOURAGED TO PRACTICE SOCIAL DISTANCING DUE TO THE COVID-19 PANDEMIC.

## 2022-07-04 NOTE — Patient Instructions (Addendum)
Take amlodipine 10mg  nightly, starting tonite 07/04/22  Bring blood pressure cuff to Friday visit  Hypertension, Adult Hypertension is another name for high blood pressure. High blood pressure forces your heart to work harder to pump blood. This can cause problems over time. There are two numbers in a blood pressure reading. There is a top number (systolic) over a bottom number (diastolic). It is best to have a blood pressure that is below 120/80. What are the causes? The cause of this condition is not known. Some other conditions can lead to high blood pressure. What increases the risk? Some lifestyle factors can make you more likely to develop high blood pressure: Smoking. Not getting enough exercise or physical activity. Being overweight. Having too much fat, sugar, calories, or salt (sodium) in your diet. Drinking too much alcohol. Other risk factors include: Having any of these conditions: Heart disease. Diabetes. High cholesterol. Kidney disease. Obstructive sleep apnea. Having a family history of high blood pressure and high cholesterol. Age. The risk increases with age. Stress. What are the signs or symptoms? High blood pressure may not cause symptoms. Very high blood pressure (hypertensive crisis) may cause: Headache. Fast or uneven heartbeats (palpitations). Shortness of breath. Nosebleed. Vomiting or feeling like you may vomit (nauseous). Changes in how you see. Very bad chest pain. Feeling dizzy. Seizures. How is this treated? This condition is treated by making healthy lifestyle changes, such as: Eating healthy foods. Exercising more. Drinking less alcohol. Your doctor may prescribe medicine if lifestyle changes do not help enough and if: Your top number is above 130. Your bottom number is above 80. Your personal target blood pressure may vary. Follow these instructions at home: Eating and drinking  If told, follow the DASH eating plan. To follow this  plan: Fill one half of your plate at each meal with fruits and vegetables. Fill one fourth of your plate at each meal with whole grains. Whole grains include whole-wheat pasta, brown rice, and whole-grain bread. Eat or drink low-fat dairy products, such as skim milk or low-fat yogurt. Fill one fourth of your plate at each meal with low-fat (lean) proteins. Low-fat proteins include fish, chicken without skin, eggs, beans, and tofu. Avoid fatty meat, cured and processed meat, or chicken with skin. Avoid pre-made or processed food. Limit the amount of salt in your diet to less than 1,500 mg each day. Do not drink alcohol if: Your doctor tells you not to drink. You are pregnant, may be pregnant, or are planning to become pregnant. If you drink alcohol: Limit how much you have to: 0-1 drink a day for women. 0-2 drinks a day for men. Know how much alcohol is in your drink. In the [place] one drink equals one 12 oz bottle of beer (355 mL), one 5 oz glass of wine (148 mL), or one 1 oz glass of hard liquor (44 mL). Lifestyle  Work with your doctor to stay at a healthy weight or to lose weight. Ask your doctor what the best weight is for you. Get at least 30 minutes of exercise that causes your heart to beat faster (aerobic exercise) most days of the week. This may include walking, swimming, or biking. Get at least 30 minutes of exercise that strengthens your muscles (resistance exercise) at least 3 days a week. This may include lifting weights or doing Pilates. Do not smoke or use any products that contain nicotine or tobacco. If you need help quitting, ask your doctor. Check your blood pressure at  home as told by your doctor. Keep all follow-up visits. Medicines Take over-the-counter and prescription medicines only as told by your doctor. Follow directions carefully. Do not skip doses of blood pressure medicine. The medicine does not work as well if you skip doses. Skipping doses also puts you at  risk for problems. Ask your doctor about side effects or reactions to medicines that you should watch for. Contact a doctor if: You think you are having a reaction to the medicine you are taking. You have headaches that keep coming back. You feel dizzy. You have swelling in your ankles. You have trouble with your vision. Get help right away if: You get a very bad headache. You start to feel mixed up (confused). You feel weak or numb. You feel faint. You have very bad pain in your: Chest. Belly (abdomen). You vomit more than once. You have trouble breathing. These symptoms may be an emergency. Get help right away. Call 911. Do not wait to see if the symptoms will go away. Do not drive yourself to the hospital. Summary Hypertension is another name for high blood pressure. High blood pressure forces your heart to work harder to pump blood. For most people, a normal blood pressure is less than 120/80. Making healthy choices can help lower blood pressure. If your blood pressure does not get lower with healthy choices, you may need to take medicine. This information is not intended to replace advice given to you by your health care provider. Make sure you discuss any questions you have with your health care provider. Document Revised: 11/04/2020 Document Reviewed: 11/04/2020 Elsevier Patient Education  2024 [employer].

## 2022-07-07 ENCOUNTER — Ambulatory Visit: Payer: Medicare Other

## 2022-07-07 VITALS — BP 136/80 | HR 81 | Temp 98.5°F | Ht 63.0 in | Wt 174.0 lb

## 2022-07-07 DIAGNOSIS — I10 Essential (primary) hypertension: Secondary | ICD-10-CM

## 2022-07-07 LAB — BMP8+EGFR
BUN/Creatinine Ratio: 13 (ref 12–28)
BUN: 12 mg/dL (ref 8–27)
CO2: 25 mmol/L (ref 20–29)
Calcium: 9.4 mg/dL (ref 8.7–10.3)
Chloride: 103 mmol/L (ref 96–106)
Creatinine, Ser: 0.89 mg/dL (ref 0.57–1.00)
Glucose: 86 mg/dL (ref 70–99)
Potassium: 3.8 mmol/L (ref 3.5–5.2)
Sodium: 142 mmol/L (ref 134–144)
eGFR: 70 mL/min/{1.73_m2} (ref >59)

## 2022-07-07 LAB — TSH: TSH: 1.39 u[IU]/mL (ref 0.450–4.500)

## 2022-07-07 LAB — ANTINUCLEAR ANTIBODIES, IFA: ANA Titer 1: NEGATIVE

## 2022-07-07 LAB — IRON,TIBC AND FERRITIN PANEL
Ferritin: 1994 ng/mL — ABNORMAL HIGH (ref 15–150)
Iron Saturation: 20 % (ref 15–55)
Iron: 55 ug/dL (ref 27–139)
Total Iron Binding Capacity: 275 ug/dL (ref 250–450)
UIBC: 220 ug/dL (ref 118–369)

## 2022-07-07 MED ORDER — METOPROLOL TARTRATE 50 MG PO TABS
ORAL_TABLET | ORAL | 1 refills | Status: DC
Start: 2022-07-07 — End: 2022-08-29

## 2022-07-07 NOTE — Progress Notes (Signed)
Patient presents today for a BP check, currently taking  amLODipine 10mg . BP Readings from Last 3 Encounters:  07/07/22 (!) 140/82  07/04/22 130/82  06/28/22 (!) 153/72  Per provider- metoprolol 50mg  1/2 tab daily, f/u 3 weeks

## 2022-07-11 NOTE — Progress Notes (Incomplete)
I,Victoria T Hamilton,acting as a scribe for Gwynneth Aliment, MD.,have documented all relevant documentation on the behalf of Gwynneth Aliment, MD,as directed by  Gwynneth Aliment, MD while in the presence of Gwynneth Aliment, MD.    Subjective:     Patient ID: Emily Greer , female    DOB: [birth year] , 72 y.o.   MRN: [id number]   Chief Complaint  Patient presents with  . Hypertension    HPI  She presents today for BP f/u. She reports compliance with meds. Denies headache, chest pain, SOB.   Hypertension This is a chronic problem. The current episode started more than 1 year ago. The problem has been gradually improving since onset. The problem is controlled. Pertinent negatives include no blurred vision. Past treatments include beta blockers and diuretics. The current treatment provides moderate improvement. Compliance problems include exercise.  There is no history of pheochromocytoma or renovascular disease.  Leg Pain      Past Medical History:  Diagnosis Date  . Avascular necrosis of femoral head (HCC)    bilateral per CT 07-26-2015  . Endometrial carcinoma [facility]) gyn oncologist-  dr Nelly Rout ([facility])/  radiation oncologist-- dr Roselind Messier   2013 dx  FIGO Stage 1A, Grade 2 endometrioid endometrial cancer s/p TAH w/ BSO and bilateral pelvic node dissection 10-31-2011 ;  recurrence at distal vagina 04/ 2014 s/p  brachytherapy (ended 07-29-2012);  2nd recurrence 12/ 2016  vaginal apex s/p  conformational radiotherapy 03-10-2015 to 04-20-2015  . Family history of adverse reaction to anesthesia    mother ponv  . GERD (gastroesophageal reflux disease)   . Goals of care, counseling/discussion 08/21/2019  . H/O stem cell transplant (HCC)    02/ 2000 and second one 06/ 2016  . 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 History of chemotherapy    last chemo 12-26-17  . History of radiation therapy 6/4, 6/11, 6/19, 6/25, 07/29/2012    vagina 30.5 gray in 5 fx, HDR brachytherapy:   last radiation to vagina 03-10-2015 to 04-20-2015  50.4gray  . History of radiation therapy 03/10/15-04/20/15   vagina 50.4 in 28 fractions  . Hypertension   . Lambda light chain myeloma [facility]) oncologist-  dr Myna Hidalgo ([facility])  and  [facility] -- dr Silvestre Mesi gasparetto   dx 07/ 1999 s/p  VAD chemotherapy 11/ 1999,  purged autotransplant 03-21-1998 followed by auto stem cell transplant 03-29-1998;  recurrance w/ second autologous stem cell transplant 07-24-2014;  in Re-mission currently , chemo maintenance therapy  . Osteoporosis 12/18/05   Increased  risk   . PONV (postoperative nausea and vomiting)   . Wears glasses      Family History  Problem Relation Age of Onset  . Colon cancer Mother   . Hypertension Father   . Heart Problems Father      Current Outpatient Medications:  .  acyclovir (ZOVIRAX) 400 MG tablet, Take 1 tablet (400 mg total) by mouth 2 (two) times daily., Disp: 60 tablet, Rfl: 6 .  amLODipine (NORVASC) 10 MG tablet, TAKE 1 TABLET(10 MG) BY MOUTH EVERY MORNING, Disp: 90 tablet, Rfl: 1 .  aspirin EC 81 MG tablet, Take 81 mg by mouth daily., Disp: , Rfl:  .  calcium-vitamin D (OSCAL WITH D) 500-5 MG-MCG tablet, Take 1 tablet by mouth daily., Disp: , Rfl:  .  chlorhexidine (PERIDEX) 0.12 % solution, 10 mLs 2 (two) times daily., Disp: , Rfl:  .  Cholecalciferol (VITAMIN D3) 2000 UNITS TABS, Take 2 tablets by mouth daily., Disp: , Rfl:  .  letrozole (FEMARA) 2.5 MG tablet, Take 1 tablet (2.5 mg total) by mouth daily., Disp: 30 tablet, Rfl: 12 .  levETIRAcetam (KEPPRA) 500 MG tablet, Take 500 mg by mouth 2 (two) times daily., Disp: , Rfl:  .  potassium chloride SA (KLOR-CON M) 20 MEQ tablet, Take 1 tablet (20 mEq total) by mouth 2 (two) times daily., Disp: 60 tablet, Rfl: 3 .  Zinc Gluconate 50 MG CAPS, Take 1 tablet by mouth daily., Disp: , Rfl:  .  albuterol (VENTOLIN HFA) 108 (90 Base) MCG/ACT inhaler, Inhale 2 puffs into  the lungs every 6 (six) hours as needed for wheezing or shortness of breath. 2 puffs 3 times daily x 5 days then every 6 hours as needed. (Patient not taking: Reported on 02/23/2022), Disp: 18 g, Rfl: 11 .  betamethasone valerate ointment (VALISONE) 0.1 %, Apply 1 Application topically daily. (Patient not taking: Reported on 06/08/2022), Disp: , Rfl:  .  cetirizine (ZYRTEC) 10 MG tablet, Take 1 tablet (10 mg total) by mouth at bedtime. (Patient not taking: Reported on 06/08/2022), Disp: 90 tablet, Rfl: 1 .  ibuprofen (ADVIL) 800 MG tablet, Take 800 mg by mouth 3 (three) times daily. (Patient not taking: Reported on 02/23/2022), Disp: , Rfl:  .  metoprolol tartrate (LOPRESSOR) 50 MG tablet, Take 1/2 tablet by mouth one time daily., Disp: 30 tablet, Rfl: 1 .  penicillin v potassium (VEETID) 500 MG tablet, Take 500 mg by mouth 4 (four) times daily. Prophylaxis dental procedure (Patient not taking: Reported on 01/19/2022), Disp: , Rfl:  .  sulfamethoxazole-trimethoprim (BACTRIM) 400-80 MG tablet, Take 1 tablet by mouth once. (Patient not taking: Reported on 07/04/2022), Disp: , Rfl:  .  triamcinolone (NASACORT) 55 MCG/ACT AERO nasal inhaler, Place 2 sprays into the nose as needed. (Patient not taking: Reported on 06/08/2022), Disp: , Rfl:  No current facility-administered medications for this visit.  Facility-Administered Medications Ordered in Other Visits:  .  0.9 %  sodium chloride infusion, , Intravenous, Once PRN, Ennever, Rose Phi, MD .  albuterol (PROVENTIL) (2.5 MG/3ML) 0.083% nebulizer solution 2.5 mg, 2.5 mg, Nebulization, Once PRN, Josph Macho, MD .  sodium chloride flush (NS) 0.9 % injection 10 mL, 10 mL, Intravenous, PRN, Erenest Blank, NP, 10 mL at 12/26/17 1130 .  sodium chloride flush (NS) 0.9 % injection 10 mL, 10 mL, Intravenous, PRN, Josph Macho, MD, 10 mL at 04/24/19 1305 .  sodium chloride flush (NS) 0.9 % injection 10 mL, 10 mL, Intravenous, PRN, Josph Macho, MD, 10 mL at  08/21/19 1233 .  sodium chloride flush (NS) 0.9 % injection 10 mL, 10 mL, Intracatheter, PRN, Erenest Blank, NP, 10 mL at 10/01/19 1053 .  sodium chloride flush (NS) 0.9 % injection 10 mL, 10 mL, Intracatheter, PRN, Erenest Blank, NP, 10 mL at 12/11/19 1414 .  sodium chloride flush (NS) 0.9 % injection 10 mL, 10 mL, Intravenous, PRN, Erenest Blank, NP, 10 mL at 07/06/21 1105 .  sodium chloride flush (NS) 0.9 % injection 10 mL, 10 mL, Intracatheter, PRN, Erenest Blank, NP, 10 mL at 07/14/21 0931   Allergies  Allergen Reactions  . Codeine Nausea Only     Review of Systems  Constitutional: Negative.   Eyes:  Negative for blurred vision.  Respiratory: Negative.    Cardiovascular: Negative.   Neurological: Negative.   Psychiatric/Behavioral: Negative.  Today's Vitals   07/04/22 1148 07/04/22 1206  BP: (!) 150/82 130/82  Pulse: 77   Temp: 98.5 F (36.9 C)   SpO2: 98%   Weight: 174 lb 12.8 oz (79.3 kg)   Height: 5\' 3"  (1.6 m)    Wt Readings from Last 3 Encounters:  07/07/22 174 lb (78.9 kg)  07/04/22 174 lb 12.8 oz (79.3 kg)  06/08/22 173 lb (78.5 kg)   Wt Readings from Last 3 Encounters:  07/07/22 174 lb (78.9 kg)  07/04/22 174 lb 12.8 oz (79.3 kg)  06/08/22 173 lb (78.5 kg)    Body mass index is 30.96 kg/m.  The ASCVD Risk score (Arnett DK, et al., 2019) failed to calculate for the following reasons:   Cannot find a previous HDL lab   Cannot find a previous total cholesterol lab ++ Objective:  Physical Exam      Assessment And Plan:     1. Essential (primary) hypertension Comments: Chronic, we reviewed each of her meds in full detail. She had several expired meds in her bag, will c/w amlodipine 10mg  nightly. F/u 72 hrs for NV. - BMP8+EGFR  2. Hair loss Comments: Chronic, I will check labs as below. I will also refer her to Derm for further evaluation. - Ambulatory referral to Dermatology - ANA, IFA (with reflex) - Iron, TIBC and Ferritin Panel -  TSH  3. Endometrial cancer (HCC) Comments: Has h/o recurrent endometrial cancer. She is currently on Femara.  I will refer her to GYN for yearly pelvic exams. - Ambulatory referral to Obstetrics / Gynecology  4. Multiple myeloma not having achieved remission (HCC) Comments: Chronic, Oncology note reviewed. She is s/p second stem cell transplant.  5. Class 1 obesity due to excess calories with serious comorbidity and body mass index (BMI) of 30.0 to 30.9 in adult Comments: She is encouraged to aim for at least 150 minutes of exercicse/week, while striving for BMI<30 to decrease cardiac risk.   Return in 3 days (on 07/07/2022), or nurse visit, Friday after 2pm.  Patient was given opportunity to ask questions. Patient verbalized understanding of the plan and was able to repeat key elements of the plan. All questions were answered to their satisfaction.    I, Gwynneth Aliment, MD, have reviewed all documentation for this visit. The documentation on 07/04/22 for the exam, diagnosis, procedures, and orders are all accurate and complete.   IF YOU HAVE BEEN REFERRED TO A SPECIALIST, IT MAY TAKE 1-2 WEEKS TO SCHEDULE/PROCESS THE REFERRAL. IF YOU HAVE NOT HEARD FROM US/SPECIALIST IN TWO WEEKS, PLEASE GIVE [place] A CALL AT [phone] X [phone].   THE PATIENT IS ENCOURAGED TO PRACTICE SOCIAL DISTANCING DUE TO THE COVID-19 PANDEMIC.

## 2022-07-19 ENCOUNTER — Other Ambulatory Visit: Payer: Medicare Other | Admitting: Pharmacist

## 2022-07-19 NOTE — Progress Notes (Unsigned)
07/19/2022 Name: Emily Greer MRN: [id number] DOB: 01/16/1951  Chief Complaint  Patient presents with   Medication Management   Hypertension    Emily Greer is a 72 y.o. year old female who presented for a telephone visit.   They were referred to the pharmacist by their PCP for assistance in managing hypertension.    Subjective:  Care Team: Primary Care Provider: [name], MD ; Next Scheduled Visit: 08/14/22  Medication Access/Adherence  Current Pharmacy:  [id number] [place], [place] - [address] AT [facility] OF [facility] & [address] [place] [address] Phone: [phone] Fax: [phone]  [facility] [place], [place] - [address] [place] [address] Phone: [phone] Fax: [phone]  [id number] - [place], [place] - [address] AT [facility] OF [facility] [address] & [address] [place] [address] Phone: [phone] Fax: [phone]   Patient reports affordability concerns with their medications: No  Patient reports access/transportation concerns to their pharmacy: No  Patient reports adherence concerns with their medications:  No     Hypertension:  Current medications: amlodipine 10 mg every evening; metoprolol tartrate 25 mg daily   Has not had a chance to check blood pressures lately   Patient {Actions; [id number] hypotensive s/sx including ***dizziness, lightheadedness.  Patient {Actions; denies-reports:120008} hypertensive symptoms including ***headache, chest pain, shortness of breath  Current meal patterns: ***  Current physical activity: ***   Objective:  Lab Results  Component Value Date   HGBA1C 5.7 (H) 06/01/2014    Lab Results  Component Value Date   CREATININE 0.89 07/04/2022   BUN 12 07/04/2022   NA 142 07/04/2022   K 3.8 07/04/2022   CL 103 07/04/2022   CO2 25  07/04/2022    Lab Results  Component Value Date   CHOL 261 (H) 03/22/2007   HDL 57 03/22/2007   LDLCALC 164 (H) 03/22/2007   TRIG 199 (H) 03/22/2007   CHOLHDL 4.6 03/22/2007    Medications Reviewed Today     Reviewed by Alden Hipp, RPH-CPP (Pharmacist) on 07/19/22 at 1439  Med List Status: <None>   Medication Order Taking? Sig Documenting Provider Last Dose Status Informant  acyclovir (ZOVIRAX) 400 MG tablet [id number] Yes Take 1 tablet (400 mg total) by mouth 2 (two) times daily. Josph Macho, MD Taking Active   albuterol (VENTOLIN HFA) 108 (90 Base) MCG/ACT inhaler [id number] No Inhale 2 puffs into the lungs every 6 (six) hours as needed for wheezing or shortness of breath. 2 puffs 3 times daily x 5 days then every 6 hours as needed.  Patient not taking: Reported on 02/23/2022   [name], MD Not Taking Active   amLODipine (NORVASC) 10 MG tablet [id number] Yes TAKE 1 TABLET(10 MG) BY MOUTH EVERY MORNING [name], MD Taking Active   aspirin EC 81 MG tablet [id number] Yes Take 81 mg by mouth daily. [provider] Taking Active   betamethasone valerate ointment (VALISONE) 0.1 % [id number]  Apply 1 Application topically daily.  Patient not taking: Reported on 06/08/2022   [provider]  Active   calcium-vitamin D (OSCAL WITH D) 500-5 MG-MCG tablet [id number] Yes Take 1 tablet by mouth daily. [provider] Taking Active   cetirizine (ZYRTEC) 10 MG tablet [id number] Yes Take 1 tablet (10 mg total) by mouth at bedtime. [name], MD Taking Active   chlorhexidine (  PERIDEX) 0.12 % solution [id number]  10 mLs 2 (two) times daily. [provider]  Active   Cholecalciferol (VITAMIN D3) 2000 UNITS TABS [id number] Yes Take 2 tablets by mouth daily. [provider] Taking Active            Med Note Clearance Coots, Desma Paganini Jul 19, 2022  2:36 PM)    ibuprofen (ADVIL) 800 MG tablet [id number]  Take 800 mg by mouth 3 (three) times  daily.  Patient not taking: Reported on 02/23/2022   [provider]  Active   letrozole [facility]) 2.5 MG tablet [id number] Yes Take 1 tablet (2.5 mg total) by mouth daily. Carver Fila, MD Taking Active   levETIRAcetam (KEPPRA) 500 MG tablet [id number] No Take 500 mg by mouth 2 (two) times daily.  Patient not taking: Reported on 07/19/2022   [provider] Not Taking Active   metoprolol tartrate (LOPRESSOR) 50 MG tablet [id number] Yes Take 1/2 tablet by mouth one time daily. [name], MD Taking Active   penicillin v potassium (VEETID) 500 MG tablet [id number] No Take 500 mg by mouth 4 (four) times daily. Prophylaxis dental procedure  Patient not taking: Reported on 01/19/2022   [provider] Not Taking Active   potassium chloride SA (KLOR-CON M) 20 MEQ tablet [id number] Yes Take 1 tablet (20 mEq total) by mouth 2 (two) times daily. Josph Macho, MD Taking Active     Discontinued 07/14/21 0913   triamcinolone (NASACORT) 55 MCG/ACT AERO nasal inhaler [id number] Yes Place 2 sprays into the nose as needed. [provider] Taking Active Self  Zinc Gluconate 50 MG CAPS [id number] Yes Take 1 tablet by mouth daily. [provider] Taking Active   Med List Note Otis Peak, [facility] 06/14/21 1110): [facility]              Assessment/Plan:   Hypertension: - Currently unknown control per last office reading, upcoming appointment next week to recheck - Reviewed long term cardiovascular and renal outcomes of uncontrolled blood pressure - Reviewed appropriate blood pressure monitoring technique and reviewed goal blood pressure. Recommended to check home blood pressure and heart rate several times weekly  - Recommend to continue current regimen. Will follow up in 6 weeks.      Follow Up Plan: ***  ***

## 2022-07-20 ENCOUNTER — Encounter: Payer: Self-pay | Admitting: Hematology & Oncology

## 2022-07-20 ENCOUNTER — Inpatient Hospital Stay: Payer: Medicare Other | Attending: Hematology & Oncology

## 2022-07-20 ENCOUNTER — Other Ambulatory Visit: Payer: Self-pay

## 2022-07-20 ENCOUNTER — Inpatient Hospital Stay: Payer: Medicare Other

## 2022-07-20 ENCOUNTER — Inpatient Hospital Stay (HOSPITAL_BASED_OUTPATIENT_CLINIC_OR_DEPARTMENT_OTHER): Payer: Medicare Other | Admitting: Hematology & Oncology

## 2022-07-20 VITALS — BP 148/77 | HR 73 | Temp 98.2°F | Resp 18 | Ht 63.0 in | Wt 173.0 lb

## 2022-07-20 VITALS — BP 184/92 | HR 63 | Temp 97.9°F | Resp 18

## 2022-07-20 DIAGNOSIS — D500 Iron deficiency anemia secondary to blood loss (chronic): Secondary | ICD-10-CM | POA: Diagnosis not present

## 2022-07-20 DIAGNOSIS — M7989 Other specified soft tissue disorders: Secondary | ICD-10-CM | POA: Diagnosis not present

## 2022-07-20 DIAGNOSIS — H538 Other visual disturbances: Secondary | ICD-10-CM | POA: Diagnosis not present

## 2022-07-20 DIAGNOSIS — C541 Malignant neoplasm of endometrium: Secondary | ICD-10-CM

## 2022-07-20 DIAGNOSIS — Z79899 Other long term (current) drug therapy: Secondary | ICD-10-CM | POA: Diagnosis not present

## 2022-07-20 DIAGNOSIS — Z9484 Stem cells transplant status: Secondary | ICD-10-CM | POA: Insufficient documentation

## 2022-07-20 DIAGNOSIS — Z923 Personal history of irradiation: Secondary | ICD-10-CM | POA: Diagnosis not present

## 2022-07-20 DIAGNOSIS — C9001 Multiple myeloma in remission: Secondary | ICD-10-CM

## 2022-07-20 DIAGNOSIS — C9000 Multiple myeloma not having achieved remission: Secondary | ICD-10-CM | POA: Insufficient documentation

## 2022-07-20 DIAGNOSIS — Z79811 Long term (current) use of aromatase inhibitors: Secondary | ICD-10-CM | POA: Diagnosis not present

## 2022-07-20 DIAGNOSIS — Z8542 Personal history of malignant neoplasm of other parts of uterus: Secondary | ICD-10-CM | POA: Diagnosis not present

## 2022-07-20 DIAGNOSIS — Z885 Allergy status to narcotic agent status: Secondary | ICD-10-CM | POA: Diagnosis not present

## 2022-07-20 DIAGNOSIS — D509 Iron deficiency anemia, unspecified: Secondary | ICD-10-CM | POA: Diagnosis not present

## 2022-07-20 LAB — CBC WITH DIFFERENTIAL (CANCER CENTER ONLY)
Abs Immature Granulocytes: 0.03 10*3/uL (ref 0.00–0.07)
Basophils Absolute: 0.1 10*3/uL (ref 0.0–0.1)
Basophils Relative: 1 %
Eosinophils Absolute: 0.1 10*3/uL (ref 0.0–0.5)
Eosinophils Relative: 2 %
HCT: 32.6 % — ABNORMAL LOW (ref 36.0–46.0)
Hemoglobin: 10.2 g/dL — ABNORMAL LOW (ref 12.0–15.0)
Immature Granulocytes: 1 %
Lymphocytes Relative: 13 %
Lymphs Abs: 0.5 10*3/uL — ABNORMAL LOW (ref 0.7–4.0)
MCH: 25.6 pg — ABNORMAL LOW (ref 26.0–34.0)
MCHC: 31.3 g/dL (ref 30.0–36.0)
MCV: 81.9 fL (ref 80.0–100.0)
Monocytes Absolute: 0.4 10*3/uL (ref 0.1–1.0)
Monocytes Relative: 11 %
Neutro Abs: 2.5 10*3/uL (ref 1.7–7.7)
Neutrophils Relative %: 72 %
Platelet Count: 155 10*3/uL (ref 150–400)
RBC: 3.98 MIL/uL (ref 3.87–5.11)
RDW: 18.6 % — ABNORMAL HIGH (ref 11.5–15.5)
WBC Count: 3.5 10*3/uL — ABNORMAL LOW (ref 4.0–10.5)
nRBC: 0 % (ref 0.0–0.2)

## 2022-07-20 LAB — CMP (CANCER CENTER ONLY)
ALT: 11 U/L (ref 0–44)
AST: 13 U/L — ABNORMAL LOW (ref 15–41)
Albumin: 4.2 g/dL (ref 3.5–5.0)
Alkaline Phosphatase: 43 U/L (ref 38–126)
Anion gap: 10 (ref 5–15)
BUN: 13 mg/dL (ref 8–23)
CO2: 27 mmol/L (ref 22–32)
Calcium: 9.3 mg/dL (ref 8.9–10.3)
Chloride: 106 mmol/L (ref 98–111)
Creatinine: 0.92 mg/dL (ref 0.44–1.00)
GFR, Estimated: >60 mL/min (ref >60)
Glucose, Bld: 116 mg/dL — ABNORMAL HIGH (ref 70–99)
Potassium: 3.1 mmol/L — ABNORMAL LOW (ref 3.5–5.1)
Sodium: 143 mmol/L (ref 135–145)
Total Bilirubin: 0.3 mg/dL (ref 0.3–1.2)
Total Protein: 6.6 g/dL (ref 6.5–8.1)

## 2022-07-20 LAB — RETICULOCYTES
Immature Retic Fract: 21.6 % — ABNORMAL HIGH (ref 2.3–15.9)
RBC.: 3.97 MIL/uL (ref 3.87–5.11)
Retic Count, Absolute: 50.8 10*3/uL (ref 19.0–186.0)
Retic Ct Pct: 1.3 % (ref 0.4–3.1)

## 2022-07-20 LAB — IRON AND IRON BINDING CAPACITY (CC-WL,HP ONLY)
Iron: 65 ug/dL (ref 28–170)
Saturation Ratios: 21 % (ref 10.4–31.8)
TIBC: 307 ug/dL (ref 250–450)
UIBC: 242 ug/dL (ref 148–442)

## 2022-07-20 LAB — LACTATE DEHYDROGENASE: LDH: 176 U/L (ref 98–192)

## 2022-07-20 LAB — FERRITIN: Ferritin: 1470 ng/mL — ABNORMAL HIGH (ref 11–307)

## 2022-07-20 MED ORDER — EPOETIN ALFA-EPBX 40000 UNIT/ML IJ SOLN
40000.0000 [IU] | Freq: Once | INTRAMUSCULAR | Status: AC
  Administered 2022-07-20: 40000 [IU] via SUBCUTANEOUS
  Filled 2022-07-20: qty 1

## 2022-07-20 MED ORDER — ACETAMINOPHEN 325 MG PO TABS
650.0000 mg | ORAL_TABLET | Freq: Once | ORAL | Status: AC
  Administered 2022-07-20: 650 mg via ORAL
  Filled 2022-07-20: qty 2

## 2022-07-20 MED ORDER — HEPARIN SOD (PORK) LOCK FLUSH 100 UNIT/ML IV SOLN
500.0000 [IU] | Freq: Once | INTRAVENOUS | Status: AC | PRN
  Administered 2022-07-20: 500 [IU]

## 2022-07-20 MED ORDER — DIPHENHYDRAMINE HCL 25 MG PO CAPS
25.0000 mg | ORAL_CAPSULE | Freq: Once | ORAL | Status: AC
  Administered 2022-07-20: 25 mg via ORAL
  Filled 2022-07-20: qty 1

## 2022-07-20 MED ORDER — DEXTROSE 5 % IV SOLN: INTRAVENOUS | Status: DC

## 2022-07-20 MED ORDER — SODIUM CHLORIDE 0.9% FLUSH
10.0000 mL | INTRAVENOUS | Status: DC | PRN
  Administered 2022-07-20: 10 mL

## 2022-07-20 MED ORDER — IMMUNE GLOBULIN (HUMAN) 10 GM/100ML IV SOLN
40.0000 g | Freq: Once | INTRAVENOUS | Status: AC
  Administered 2022-07-20: 40 g via INTRAVENOUS
  Filled 2022-07-20: qty 400

## 2022-07-20 NOTE — Patient Instructions (Signed)
Emily Greer,   It was great talking to you today!  Check your blood pressure once daily, and any time you have concerning symptoms like headache, chest pain, dizziness, shortness of breath, or vision changes.   Our goal is less than 140/90.  To appropriately check your blood pressure, make sure you do the following:  1) Avoid caffeine, exercise, or tobacco products for 30 minutes before checking. Empty your bladder. 2) Sit with your back supported in a flat-backed chair. Rest your arm on something flat (arm of the chair, table, etc). 3) Sit still with your feet flat on the floor, resting, for at least 5 minutes.  4) Check your blood pressure. Take 1-2 readings.  5) Write down these readings and bring with you to any provider appointments.  Bring your home blood pressure machine with you to a provider's office for accuracy comparison at least once a year.   Make sure you take your blood pressure medications before you come to any office visit, even if you were asked to fast for labs.   Take care!  Emily Greer, PharmD, BCACP, CPP Clinical Pharmacist [facility] [phone]

## 2022-07-20 NOTE — Progress Notes (Signed)
Patient does not want to stay for the 30 minute post IVIG observation. VSS. Patient discharged ambulatory without complaints or concerns.

## 2022-07-20 NOTE — Patient Instructions (Signed)

## 2022-07-20 NOTE — Patient Instructions (Signed)

## 2022-07-20 NOTE — Progress Notes (Signed)
She is Hematology and Oncology Follow Up Visit  Emily Greer [id number] September 21, 1950 72 y.o. 07/20/2022   Principle Diagnosis:  Recurrent lambda light chain myeloma - nl cytogenetics History of recurrent endometrial carcinoma Iron deficiency anemia -blood loss   Past Therapy:             Status post second autologous stem cell transplant on 07/24/2014 Maintenance therapy with Pomalidomide/every 2 week Velcade - d/c'ed Radiation therapy for endometrial recurrence - completed 04/20/2015 Pomalyst/Kyprolis 70mg /m2 IV q 2 weeks - s/p cycle #12 - held on 12/26/2017 for hematuria Daratumumab/Pomalyst (1 mg) - status post cycle 19 -- d/c on 08/21/2019 Melflufen 40 mg IV q 4 weeks -- started on 08/27/2019, s/p cycle #2 --  D/c due to FDA removal Selinexor 60 mg po q week -- start on 01/19/2020 -- changed on 03/18/2020 -- d/c on 04/27/2020   Current Therapy:        Blenrep 2.5 mg/m2 IV q 3 weeks -- started on 05/20/2020, s/p cycle #7 --D/C secondary to progression on 12/22/2020 Carfilzomib/Sarclisa -- s/p cycle #3 -- start on 01/03/2021 Femara 2.5 mg po q day  IV iron-Feraheme given on 07/15/2021. Cytoxan/Pomalyst/decadron -- start cycle #1 on 06/15/2021 - d/c 06/2021 S/p Cilta-cel CAR-T infusion -- 07/22/2021 Xgeva 120 mg IM q 3 months -- next dose 08/2022 Retacrit 40,000 units sq q 4 weeks for Hgb < 11 IVIG 40g IV q 6 weeks -- start on 11/16/2021   Interim History:  Emily Greer is here today for follow-up.  She is doing quite nicely.  She feels well.  She and her husband just got back from [place].  A son just got his PhD from [place].  They are very proud of him.  She has had no complaints since we last saw her.  She does need a new gynecologist.  Her family doctor made a recommendation for her.  She has had no problems with infections.  She is doing well on the IVIG.  Her last IgG level was still little bit low at 343 mg/dL.  She has had no problems with bowels or bladder.  She has  had no rashes.  She still has little swelling in the legs.  However, her hemoglobin is little bit better.  Hopefully, this will improve the swelling.  She has had no headache.  Overall, I would say that her performance status is probably ECOG 1.    Medications:  Allergies as of 07/20/2022       Reactions   Codeine Nausea Only        Medication List        Accurate as of July 20, 2022  9:25 AM. If you have any questions, ask your nurse or doctor.          acyclovir 400 MG tablet Commonly known as: ZOVIRAX Take 1 tablet (400 mg total) by mouth 2 (two) times daily.   albuterol 108 (90 Base) MCG/ACT inhaler Commonly known as: VENTOLIN HFA Inhale 2 puffs into the lungs every 6 (six) hours as needed for wheezing or shortness of breath. 2 puffs 3 times daily x 5 days then every 6 hours as needed.   amLODipine 10 MG tablet Commonly known as: NORVASC TAKE 1 TABLET(10 MG) BY MOUTH EVERY MORNING   aspirin EC 81 MG tablet Take 81 mg by mouth daily.   betamethasone valerate ointment 0.1 % Commonly known as: VALISONE Apply 1 Application topically daily.   calcium-vitamin D 500-5 MG-MCG tablet Commonly known as:  OSCAL WITH D Take 1 tablet by mouth daily.   cetirizine 10 MG tablet Commonly known as: ZYRTEC Take 1 tablet (10 mg total) by mouth at bedtime.   chlorhexidine 0.12 % solution Commonly known as: PERIDEX 10 mLs 2 (two) times daily.   ibuprofen 800 MG tablet Commonly known as: ADVIL Take 800 mg by mouth 3 (three) times daily.   letrozole 2.5 MG tablet Commonly known as: Femara Take 1 tablet (2.5 mg total) by mouth daily.   levETIRAcetam 500 MG tablet Commonly known as: KEPPRA Take 500 mg by mouth 2 (two) times daily.   metoprolol tartrate 50 MG tablet Commonly known as: LOPRESSOR Take 1/2 tablet by mouth one time daily.   penicillin v potassium 500 MG tablet Commonly known as: VEETID Take 500 mg by mouth 4 (four) times daily. Prophylaxis dental  procedure   potassium chloride SA 20 MEQ tablet Commonly known as: KLOR-CON M Take 1 tablet (20 mEq total) by mouth 2 (two) times daily.   triamcinolone 55 MCG/ACT Aero nasal inhaler Commonly known as: NASACORT Place 2 sprays into the nose as needed.   Vitamin D3 50 MCG (2000 UT) Tabs Take 2 tablets by mouth daily.   Zinc Gluconate 50 MG Caps Take 1 tablet by mouth daily.        Allergies:  Allergies  Allergen Reactions   Codeine Nausea Only    Past Medical History, Surgical history, Social history, and Family History were reviewed and updated.  Review of Systems: Review of Systems  Constitutional: Negative.   HENT: Negative.    Eyes:  Positive for blurred vision.  Respiratory: Negative.    Cardiovascular: Negative.   Gastrointestinal: Negative.   Genitourinary: Negative.   Musculoskeletal: Negative.   Skin: Negative.   Neurological: Negative.   Endo/Heme/Allergies: Negative.   Psychiatric/Behavioral: Negative.       Physical Exam:  height is 5\' 3"  (1.6 m) and weight is 173 lb (78.5 kg). Her oral temperature is 98.2 F (36.8 C). Her blood pressure is 148/77 (abnormal) and her pulse is 73. Her respiration is 18 and oxygen saturation is 99%.   Wt Readings from Last 3 Encounters:  07/20/22 173 lb (78.5 kg)  07/07/22 174 lb (78.9 kg)  07/04/22 174 lb 12.8 oz (79.3 kg)    Physical Exam Vitals reviewed.  HENT:     Head: Normocephalic and atraumatic.  Eyes:     Pupils: Pupils are equal, round, and reactive to light.  Cardiovascular:     Rate and Rhythm: Normal rate and regular rhythm.     Heart sounds: Normal heart sounds.  Pulmonary:     Effort: Pulmonary effort is normal.     Breath sounds: Normal breath sounds.  Abdominal:     General: Bowel sounds are normal.     Palpations: Abdomen is soft.  Musculoskeletal:        General: No tenderness or deformity. Normal range of motion.     Cervical back: Normal range of motion.  Lymphadenopathy:      Cervical: No cervical adenopathy.  Skin:    General: Skin is warm and dry.     Findings: No erythema or rash.  Neurological:     Mental Status: She is alert and oriented to person, place, and time.  Psychiatric:        Behavior: Behavior normal.        Thought Content: Thought content normal.        Judgment: Judgment normal.  Lab Results  Component Value Date   WBC 3.5 (L) 07/20/2022   HGB 10.2 (L) 07/20/2022   HCT 32.6 (L) 07/20/2022   MCV 81.9 07/20/2022   PLT 155 07/20/2022   Lab Results  Component Value Date   FERRITIN 1,994 (H) 07/04/2022   IRON 55 07/04/2022   TIBC 275 07/04/2022   UIBC 220 07/04/2022   IRONPCTSAT 20 07/04/2022   Lab Results  Component Value Date   RETICCTPCT 1.3 07/20/2022   RBC 3.97 07/20/2022   RBC 3.98 07/20/2022   Lab Results  Component Value Date   KPAFRELGTCHN <0.7 (L) 06/08/2022   LAMBDASER <1.5 (L) 06/08/2022   KAPLAMBRATIO UNABLE TO CALCULATE 06/08/2022   Lab Results  Component Value Date   IGGSERUM 343 (L) 06/08/2022   IGA <5 (L) 06/08/2022   IGMSERUM <5 (L) 06/08/2022   Lab Results  Component Value Date   TOTALPROTELP 6.2 06/08/2022   ALBUMINELP 3.6 06/08/2022   A1GS 0.2 06/08/2022   A2GS 1.0 06/08/2022   BETS 1.0 06/08/2022   BETA2SER 0.4 11/23/2014   GAMS 0.3 (L) 06/08/2022   MSPIKE Not Observed 06/08/2022   SPEI Comment 10/19/2020     Chemistry      Component Value Date/Time   NA 142 07/04/2022 1248   NA 141 01/10/2017 1115   NA 140 06/21/2016 0918   K 3.8 07/04/2022 1248   K 4.0 01/10/2017 1115   K 4.3 06/21/2016 0918   CL 103 07/04/2022 1248   CL 106 01/10/2017 1115   CO2 25 07/04/2022 1248   CO2 27 01/10/2017 1115   CO2 20 (L) 06/21/2016 0918   BUN 12 07/04/2022 1248   BUN 15 01/10/2017 1115   BUN 15.8 06/21/2016 0918   CREATININE 0.89 07/04/2022 1248   CREATININE 0.94 06/08/2022 0830   CREATININE 1.0 01/10/2017 1115   CREATININE 0.8 06/21/2016 0918      Component Value Date/Time   CALCIUM  9.4 07/04/2022 1248   CALCIUM 9.5 01/10/2017 1115   CALCIUM 9.4 06/21/2016 0918   ALKPHOS 49 06/08/2022 0830   ALKPHOS 40 01/10/2017 1115   ALKPHOS 66 06/21/2016 0918   AST 14 (L) 06/08/2022 0830   AST 17 06/21/2016 0918   ALT 17 06/08/2022 0830   ALT 19 01/10/2017 1115   ALT 37 06/21/2016 0918   BILITOT 0.4 06/08/2022 0830   BILITOT 0.32 06/21/2016 0918       Impression and Plan: Ms. Jupiter is a very pleasant 72 yo African American female with recurrent lambda light chain myeloma.  She had her second stem cell transplant for light chain myeloma was in June 2016.   Everything is looking quite good.  I am sure that the CAR-T therapy is keeping her in remission.  I am just very happy about that.  It is now been a year that she had her CAR-T therapy.  On IVIG to try to help prevent infections.  So far, this is helping.  I think we can move her IVIG infusions every 6 weeks now..  She continues on the Femara for her uterine cancer.  I really do not think this can be a problem for her.  She will get Retacrit today.  We want to try to keep her hemoglobin as high as possible.  I will plan to get her back in 6 weeks.   Josph Macho, MD 6/20/20249:25 AM

## 2022-07-21 LAB — IGG, IGA, IGM
IgA: <5 mg/dL — ABNORMAL LOW (ref 87–352)
IgG (Immunoglobin G), Serum: 553 mg/dL — ABNORMAL LOW (ref 586–1602)
IgM (Immunoglobulin M), Srm: 12 mg/dL — ABNORMAL LOW (ref 26–217)

## 2022-07-21 LAB — KAPPA/LAMBDA LIGHT CHAINS
Kappa free light chain: <0.7 mg/L — ABNORMAL LOW (ref 3.3–19.4)
Kappa, lambda light chain ratio: UNDETERMINED
Lambda free light chains: <1.5 mg/L — ABNORMAL LOW (ref 5.7–26.3)

## 2022-07-25 ENCOUNTER — Ambulatory Visit: Payer: Medicare Other

## 2022-07-25 VITALS — BP 132/80 | HR 73 | Temp 98.4°F | Ht 63.0 in | Wt 173.0 lb

## 2022-07-25 DIAGNOSIS — I10 Essential (primary) hypertension: Secondary | ICD-10-CM

## 2022-07-25 NOTE — Patient Instructions (Signed)
Hypertension, Adult Hypertension is another name for high blood pressure. High blood pressure forces your heart to work harder to pump blood. This can cause problems over time. There are two numbers in a blood pressure reading. There is a top number (systolic) over a bottom number (diastolic). It is best to have a blood pressure that is below 120/80. What are the causes? The cause of this condition is not known. Some other conditions can lead to high blood pressure. What increases the risk? Some lifestyle factors can make you more likely to develop high blood pressure: Smoking. Not getting enough exercise or physical activity. Being overweight. Having too much fat, sugar, calories, or salt (sodium) in your diet. Drinking too much alcohol. Other risk factors include: Having any of these conditions: Heart disease. Diabetes. High cholesterol. Kidney disease. Obstructive sleep apnea. Having a family history of high blood pressure and high cholesterol. Age. The risk increases with age. Stress. What are the signs or symptoms? High blood pressure may not cause symptoms. Very high blood pressure (hypertensive crisis) may cause: Headache. Fast or uneven heartbeats (palpitations). Shortness of breath. Nosebleed. Vomiting or feeling like you may vomit (nauseous). Changes in how you see. Very bad chest pain. Feeling dizzy. Seizures. How is this treated? This condition is treated by making healthy lifestyle changes, such as: Eating healthy foods. Exercising more. Drinking less alcohol. Your doctor may prescribe medicine if lifestyle changes do not help enough and if: Your top number is above 130. Your bottom number is above 80. Your personal target blood pressure may vary. Follow these instructions at home: Eating and drinking  If told, follow the DASH eating plan. To follow this plan: Fill one half of your plate at each meal with fruits and vegetables. Fill one fourth of your plate  at each meal with whole grains. Whole grains include whole-wheat pasta, brown rice, and whole-grain bread. Eat or drink low-fat dairy products, such as skim milk or low-fat yogurt. Fill one fourth of your plate at each meal with low-fat (lean) proteins. Low-fat proteins include fish, chicken without skin, eggs, beans, and tofu. Avoid fatty meat, cured and processed meat, or chicken with skin. Avoid pre-made or processed food. Limit the amount of salt in your diet to less than 1,500 mg each day. Do not drink alcohol if: Your doctor tells you not to drink. You are pregnant, may be pregnant, or are planning to become pregnant. If you drink alcohol: Limit how much you have to: 0-1 drink a day for women. 0-2 drinks a day for men. Know how much alcohol is in your drink. In the [place] one drink equals one 12 oz bottle of beer (355 mL), one 5 oz glass of wine (148 mL), or one 1 oz glass of hard liquor (44 mL). Lifestyle  Work with your doctor to stay at a healthy weight or to lose weight. Ask your doctor what the best weight is for you. Get at least 30 minutes of exercise that causes your heart to beat faster (aerobic exercise) most days of the week. This may include walking, swimming, or biking. Get at least 30 minutes of exercise that strengthens your muscles (resistance exercise) at least 3 days a week. This may include lifting weights or doing Pilates. Do not smoke or use any products that contain nicotine or tobacco. If you need help quitting, ask your doctor. Check your blood pressure at home as told by your doctor. Keep all follow-up visits. Medicines Take over-the-counter and prescription medicines   only as told by your doctor. Follow directions carefully. Do not skip doses of blood pressure medicine. The medicine does not work as well if you skip doses. Skipping doses also puts you at risk for problems. Ask your doctor about side effects or reactions to medicines that you should watch  for. Contact a doctor if: You think you are having a reaction to the medicine you are taking. You have headaches that keep coming back. You feel dizzy. You have swelling in your ankles. You have trouble with your vision. Get help right away if: You get a very bad headache. You start to feel mixed up (confused). You feel weak or numb. You feel faint. You have very bad pain in your: Chest. Belly (abdomen). You vomit more than once. You have trouble breathing. These symptoms may be an emergency. Get help right away. Call 911. Do not wait to see if the symptoms will go away. Do not drive yourself to the hospital. Summary Hypertension is another name for high blood pressure. High blood pressure forces your heart to work harder to pump blood. For most people, a normal blood pressure is less than 120/80. Making healthy choices can help lower blood pressure. If your blood pressure does not get lower with healthy choices, you may need to take medicine. This information is not intended to replace advice given to you by your health care provider. Make sure you discuss any questions you have with your health care provider. Document Revised: 11/04/2020 Document Reviewed: 11/04/2020 Elsevier Patient Education  2024 [employer]  

## 2022-07-25 NOTE — Progress Notes (Signed)
Patient presents today for a bp check. She is currently taking amlodipine 10mg  in the morning and metoprolol 50mg  1/2 tablet at dinner time. Her bp today is 134/80 p82 I waited 10 mins and checked again it was 132/80 p 73. Pt was advised to make sure she takes her medication as prescribed and she is to follow up with [place] in 3 weeks. YL,RMA   BP Readings from Last 3 Encounters:  07/20/22 (!) 184/92  07/20/22 (!) 148/77  07/07/22 136/80

## 2022-08-08 ENCOUNTER — Ambulatory Visit: Payer: Medicare Other

## 2022-08-10 DIAGNOSIS — D801 Nonfamilial hypogammaglobulinemia: Secondary | ICD-10-CM | POA: Diagnosis not present

## 2022-08-10 DIAGNOSIS — C9001 Multiple myeloma in remission: Secondary | ICD-10-CM | POA: Diagnosis not present

## 2022-08-10 DIAGNOSIS — C9000 Multiple myeloma not having achieved remission: Secondary | ICD-10-CM | POA: Diagnosis not present

## 2022-08-10 DIAGNOSIS — Z9289 Personal history of other medical treatment: Secondary | ICD-10-CM | POA: Diagnosis not present

## 2022-08-10 DIAGNOSIS — R6889 Other general symptoms and signs: Secondary | ICD-10-CM | POA: Diagnosis not present

## 2022-08-14 ENCOUNTER — Ambulatory Visit: Payer: Self-pay | Admitting: Internal Medicine

## 2022-08-15 ENCOUNTER — Ambulatory Visit: Payer: Medicare Other

## 2022-08-15 VITALS — BP 138/70 | HR 77 | Temp 98.4°F | Ht 63.0 in | Wt 173.0 lb

## 2022-08-15 DIAGNOSIS — I10 Essential (primary) hypertension: Secondary | ICD-10-CM

## 2022-08-15 NOTE — Progress Notes (Signed)
Patient presents today for a bp check, patient currently taking metoprolol tartate 50mg  TAKES 1/2 tablet with dinner , amLODipine 10mg  AM. BP Readings from Last 3 Encounters:  08/15/22 (!) 148/70  07/25/22 132/80  07/20/22 (!) 184/92  Per provider- If not better in 3 weeks, will need to INCREASE metoprolol. Please schedule for [place] 3 weeks.

## 2022-08-16 ENCOUNTER — Encounter: Payer: Self-pay | Admitting: Hematology & Oncology

## 2022-08-24 ENCOUNTER — Other Ambulatory Visit: Payer: Self-pay | Admitting: Internal Medicine

## 2022-08-24 DIAGNOSIS — N281 Cyst of kidney, acquired: Secondary | ICD-10-CM

## 2022-08-24 DIAGNOSIS — R3129 Other microscopic hematuria: Secondary | ICD-10-CM

## 2022-08-25 ENCOUNTER — Ambulatory Visit
Admission: RE | Admit: 2022-08-25 | Discharge: 2022-08-25 | Disposition: A | Discharge status: Home / Self Care | Payer: Medicare Other | Source: Ambulatory Visit | Attending: Internal Medicine | Admitting: Internal Medicine

## 2022-08-25 DIAGNOSIS — N281 Cyst of kidney, acquired: Secondary | ICD-10-CM

## 2022-08-29 ENCOUNTER — Ambulatory Visit: Payer: Medicare Other | Admitting: Internal Medicine

## 2022-08-29 ENCOUNTER — Encounter: Payer: Self-pay | Admitting: Internal Medicine

## 2022-08-29 VITALS — BP 136/80 | HR 98 | Temp 98.5°F | Ht 63.0 in | Wt 173.6 lb

## 2022-08-29 DIAGNOSIS — T451X5A Adverse effect of antineoplastic and immunosuppressive drugs, initial encounter: Secondary | ICD-10-CM | POA: Diagnosis not present

## 2022-08-29 DIAGNOSIS — Z6830 Body mass index (BMI) 30.0-30.9, adult: Secondary | ICD-10-CM

## 2022-08-29 DIAGNOSIS — D701 Agranulocytosis secondary to cancer chemotherapy: Secondary | ICD-10-CM

## 2022-08-29 DIAGNOSIS — C9000 Multiple myeloma not having achieved remission: Secondary | ICD-10-CM

## 2022-08-29 DIAGNOSIS — E6609 Other obesity due to excess calories: Secondary | ICD-10-CM

## 2022-08-29 DIAGNOSIS — Z9484 Stem cells transplant status: Secondary | ICD-10-CM

## 2022-08-29 DIAGNOSIS — I10 Essential (primary) hypertension: Secondary | ICD-10-CM | POA: Diagnosis not present

## 2022-08-29 DIAGNOSIS — C541 Malignant neoplasm of endometrium: Secondary | ICD-10-CM

## 2022-08-29 MED ORDER — METOPROLOL TARTRATE 50 MG PO TABS: ORAL_TABLET | ORAL | 1 refills | Status: AC

## 2022-08-29 NOTE — Progress Notes (Signed)
I,Victoria T Deloria Lair, CMA,acting as a Neurosurgeon for Gwynneth Aliment, MD.,have documented all relevant documentation on the behalf of Gwynneth Aliment, MD,as directed by  Gwynneth Aliment, MD while in the presence of Gwynneth Aliment, MD.  Subjective:  Patient ID: Emily Greer , female    DOB: 1950-11-16 , 72 y.o.   MRN: [id number]  Chief Complaint  Patient presents with   Hypertension    HPI  She presents today for BP f/u. She reports compliance with meds. Denies headache, chest pain, and SOB. Unfortunately, she did not bring in her meds today.  She reports being confused about her meds.  Denies headaches, chest pain and shortness of breath. She states driving on [address] was stressful.   TransactRx rejected for shingles.   Hypertension This is a chronic problem. The current episode started more than 1 year ago. The problem has been gradually improving since onset. The problem is controlled. Pertinent negatives include no blurred vision. Past treatments include beta blockers and diuretics. The current treatment provides moderate improvement. Compliance problems include exercise.  There is no history of pheochromocytoma or renovascular disease.     Past Medical History:  Diagnosis Date   Avascular necrosis of femoral head (HCC)    bilateral per CT 07-26-2015   Endometrial carcinoma [facility]) gyn oncologist-  dr Nelly Rout ([facility])/  radiation oncologist-- dr Roselind Messier   2013 dx  FIGO Stage 1A, Grade 2 endometrioid endometrial cancer s/p TAH w/ BSO and bilateral pelvic node dissection 10-31-2011 ;  recurrence at distal vagina 04/ 2014 s/p  brachytherapy (ended 07-29-2012);  2nd recurrence 12/ 2016  vaginal apex s/p  conformational radiotherapy 03-10-2015 to 04-20-2015   Family history of adverse reaction to anesthesia    mother ponv   GERD (gastroesophageal reflux disease)    Goals of care, counseling/discussion 08/21/2019   H/O stem cell transplant (HCC)    02/ 2000 and second one 06/  2016   History of bacteremia    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   History of chemotherapy    last chemo 12-26-17   History of radiation therapy 6/4, 6/11, 6/19, 6/25, 07/29/2012   vagina 30.5 gray in 5 fx, HDR brachytherapy:   last radiation to vagina 03-10-2015 to 04-20-2015  50.4gray   History of radiation therapy 03/10/15-04/20/15   vagina 50.4 in 28 fractions   Hypertension    Lambda light chain myeloma [facility]) oncologist-  dr Myna Hidalgo ([facility])  and  [facility] -- dr Silvestre Mesi gasparetto   dx 07/ 1999 s/p  VAD chemotherapy 11/ 1999,  purged autotransplant 03-21-1998 followed by auto stem cell transplant 03-29-1998;  recurrance w/ second autologous stem cell transplant 07-24-2014;  in Re-mission currently , chemo maintenance therapy   Osteoporosis 12/18/05   Increased  risk    PONV (postoperative nausea and vomiting)    Wears glasses      Family History  Problem Relation Age of Onset   Colon cancer Mother    Hypertension Father    Heart Problems Father      Current Outpatient Medications:    acyclovir (ZOVIRAX) 400 MG tablet, Take 1 tablet (400 mg total) by mouth 2 (two) times daily., Disp: 60 tablet, Rfl: 6   amLODipine (NORVASC) 10 MG tablet, TAKE 1 TABLET(10 MG) BY MOUTH EVERY MORNING, Disp: 90 tablet, Rfl: 1   aspirin EC 81 MG tablet, Take 81 mg by mouth daily., Disp: , Rfl:    calcium-vitamin D (OSCAL WITH  D) 500-5 MG-MCG tablet, Take 1 tablet by mouth daily., Disp: , Rfl:    cetirizine (ZYRTEC) 10 MG tablet, Take 1 tablet (10 mg total) by mouth at bedtime., Disp: 90 tablet, Rfl: 1   chlorhexidine (PERIDEX) 0.12 % solution, 10 mLs 2 (two) times daily., Disp: , Rfl:    Cholecalciferol (VITAMIN D3) 2000 UNITS TABS, Take 2 tablets by mouth daily., Disp: , Rfl:    letrozole (FEMARA) 2.5 MG tablet, Take 1 tablet (2.5 mg total) by mouth daily. (Patient not taking: Reported on 08/30/2022), Disp: 30 tablet, Rfl: 12   potassium chloride SA (KLOR-CON M) 20 MEQ  tablet, Take 1 tablet (20 mEq total) by mouth 2 (two) times daily., Disp: 60 tablet, Rfl: 3   triamcinolone (NASACORT) 55 MCG/ACT AERO nasal inhaler, Place 2 sprays into the nose as needed., Disp: , Rfl:    Zinc Gluconate 50 MG CAPS, Take 1 tablet by mouth daily., Disp: , Rfl:    albuterol (VENTOLIN HFA) 108 (90 Base) MCG/ACT inhaler, Inhale 2 puffs into the lungs every 6 (six) hours as needed for wheezing or shortness of breath. 2 puffs 3 times daily x 5 days then every 6 hours as needed. (Patient not taking: Reported on 02/23/2022), Disp: 18 g, Rfl: 11   betamethasone valerate ointment (VALISONE) 0.1 %, Apply 1 Application topically daily. (Patient not taking: Reported on 06/08/2022), Disp: , Rfl:    ibuprofen (ADVIL) 800 MG tablet, Take 800 mg by mouth 3 (three) times daily. (Patient not taking: Reported on 02/23/2022), Disp: , Rfl:    levETIRAcetam (KEPPRA) 500 MG tablet, Take 500 mg by mouth 2 (two) times daily. (Patient not taking: Reported on 07/19/2022), Disp: , Rfl:    metoprolol tartrate (LOPRESSOR) 50 MG tablet, Take 1/2 tablet by mouth twice daily, Disp: 90 tablet, Rfl: 1   penicillin v potassium (VEETID) 500 MG tablet, Take 500 mg by mouth 4 (four) times daily. Prophylaxis dental procedure (Patient not taking: Reported on 01/19/2022), Disp: , Rfl:  No current facility-administered medications for this visit.  Facility-Administered Medications Ordered in Other Visits:    0.9 %  sodium chloride infusion, , Intravenous, Once PRN, Ennever, Rose Phi, MD   albuterol (PROVENTIL) (2.5 MG/3ML) 0.083% nebulizer solution 2.5 mg, 2.5 mg, Nebulization, Once PRN, Ennever, Rose Phi, MD   sodium chloride flush (NS) 0.9 % injection 10 mL, 10 mL, Intravenous, PRN, Erenest Blank, NP, 10 mL at 12/26/17 1130   sodium chloride flush (NS) 0.9 % injection 10 mL, 10 mL, Intravenous, PRN, Josph Macho, MD, 10 mL at 04/24/19 1305   sodium chloride flush (NS) 0.9 % injection 10 mL, 10 mL, Intravenous, PRN, Josph Macho, MD, 10 mL at 08/21/19 1233   sodium chloride flush (NS) 0.9 % injection 10 mL, 10 mL, Intracatheter, PRN, Erenest Blank, NP, 10 mL at 10/01/19 1053   sodium chloride flush (NS) 0.9 % injection 10 mL, 10 mL, Intracatheter, PRN, Erenest Blank, NP, 10 mL at 12/11/19 1414   sodium chloride flush (NS) 0.9 % injection 10 mL, 10 mL, Intravenous, PRN, Erenest Blank, NP, 10 mL at 07/06/21 1105   sodium chloride flush (NS) 0.9 % injection 10 mL, 10 mL, Intracatheter, PRN, Erenest Blank, NP, 10 mL at 07/14/21 0931   Allergies  Allergen Reactions   Codeine Nausea Only     Review of Systems  Constitutional: Negative.   Eyes:  Negative for blurred vision.  Respiratory: Negative.    Cardiovascular: Negative.  Neurological: Negative.   Psychiatric/Behavioral: Negative.       Today's Vitals   08/29/22 1124 08/29/22 1204  BP: (!) 150/80 136/80  Pulse: 98   Temp: 98.5 F (36.9 C)   SpO2: 98%   Weight: 173 lb 9.6 oz (78.7 kg)   Height: 5\' 3"  (1.6 m)    Body mass index is 30.75 kg/m.  Wt Readings from Last 3 Encounters:  08/30/22 175 lb (79.4 kg)  08/29/22 173 lb 9.6 oz (78.7 kg)  08/15/22 173 lb (78.5 kg)    BP Readings from Last 3 Encounters:  08/30/22 104/67  08/29/22 136/80  08/15/22 138/70     Objective:  Physical Exam Vitals and nursing note reviewed.  Constitutional:      Appearance: Normal appearance.  HENT:     Head: Normocephalic and atraumatic.  Eyes:     Extraocular Movements: Extraocular movements intact.  Cardiovascular:     Rate and Rhythm: Normal rate and regular rhythm.     Heart sounds: Normal heart sounds.  Pulmonary:     Effort: Pulmonary effort is normal.     Breath sounds: Normal breath sounds.  Musculoskeletal:     Cervical back: Normal range of motion.  Skin:    General: Skin is warm.  Neurological:     General: No focal deficit present.     Mental Status: She is alert.  Psychiatric:        Mood and Affect: Mood normal.         Behavior: Behavior normal.         Assessment And Plan:  Essential (primary) hypertension Assessment & Plan: Uncontrolled. She will c/w amlodipine 10mg  in AM. I will INCREASE metoprolol to 1/2 tablet twice daily. She will f/u in two weeks for a nurse visit. She is encouraged to take ALL meds as prescribed.   Orders: -     Metoprolol Tartrate; Take 1/2 tablet by mouth twice daily  Dispense: 90 tablet; Refill: 1  Multiple myeloma not having achieved remission [facility]) Assessment & Plan: Chronic, most recent Oncology notes reviewed. Also followed by [facility] in [place], [place].  These notes reviewed as well.     Chemotherapy-induced neutropenia (HCC) Assessment & Plan: Most recent CBC results reviewed in detail. She is also followed by Oncology. No intervention on my end.    Class 1 obesity due to excess calories without serious comorbidity with body mass index (BMI) of 30.0 to 30.9 in adult Assessment & Plan: Her BMI is acceptable for her demographic. Encouraged to aim for at least 150 minutes of exercise per week     Stem cells transplant status (HCC)  She is encouraged to strive for BMI less than 30 to decrease cardiac risk. Advised to aim for at least 150 minutes of exercise per week.    Return in 2 weeks (on 09/12/2022), or [place] - bp check, get vitals, let pt know we will call her w/ recommendations, for 8 wks Bp check with RS.  Patient was given opportunity to ask questions. Patient verbalized understanding of the plan and was able to repeat key elements of the plan. All questions were answered to their satisfaction.   I, Gwynneth Aliment, MD, have reviewed all documentation for this visit. The documentation on 08/29/22 for the exam, diagnosis, procedures, and orders are all accurate and complete.   IF YOU HAVE BEEN REFERRED TO A SPECIALIST, IT MAY TAKE 1-2 WEEKS TO SCHEDULE/PROCESS THE REFERRAL. IF YOU HAVE NOT HEARD FROM US/SPECIALIST  IN TWO WEEKS, PLEASE GIVE [place]  A CALL AT [phone].   THE PATIENT IS ENCOURAGED TO PRACTICE SOCIAL DISTANCING DUE TO THE COVID-19 PANDEMIC.

## 2022-08-29 NOTE — Patient Instructions (Addendum)
Uncontrolled. She will c/w amlodipine 10mg  in AM. I will INCREASE metoprolol to 1/2 tablet twice daily   Hypertension, Adult Hypertension is another name for high blood pressure. High blood pressure forces your heart to work harder to pump blood. This can cause problems over time. There are two numbers in a blood pressure reading. There is a top number (systolic) over a bottom number (diastolic). It is best to have a blood pressure that is below 120/80. What are the causes? The cause of this condition is not known. Some other conditions can lead to high blood pressure. What increases the risk? Some lifestyle factors can make you more likely to develop high blood pressure: Smoking. Not getting enough exercise or physical activity. Being overweight. Having too much fat, sugar, calories, or salt (sodium) in your diet. Drinking too much alcohol. Other risk factors include: Having any of these conditions: Heart disease. Diabetes. High cholesterol. Kidney disease. Obstructive sleep apnea. Having a family history of high blood pressure and high cholesterol. Age. The risk increases with age. Stress. What are the signs or symptoms? High blood pressure may not cause symptoms. Very high blood pressure (hypertensive crisis) may cause: Headache. Fast or uneven heartbeats (palpitations). Shortness of breath. Nosebleed. Vomiting or feeling like you may vomit (nauseous). Changes in how you see. Very bad chest pain. Feeling dizzy. Seizures. How is this treated? This condition is treated by making healthy lifestyle changes, such as: Eating healthy foods. Exercising more. Drinking less alcohol. Your doctor may prescribe medicine if lifestyle changes do not help enough and if: Your top number is above 130. Your bottom number is above 80. Your personal target blood pressure may vary. Follow these instructions at home: Eating and drinking  If told, follow the DASH eating plan. To follow this  plan: Fill one half of your plate at each meal with fruits and vegetables. Fill one fourth of your plate at each meal with whole grains. Whole grains include whole-wheat pasta, brown rice, and whole-grain bread. Eat or drink low-fat dairy products, such as skim milk or low-fat yogurt. Fill one fourth of your plate at each meal with low-fat (lean) proteins. Low-fat proteins include fish, chicken without skin, eggs, beans, and tofu. Avoid fatty meat, cured and processed meat, or chicken with skin. Avoid pre-made or processed food. Limit the amount of salt in your diet to less than 1,500 mg each day. Do not drink alcohol if: Your doctor tells you not to drink. You are pregnant, may be pregnant, or are planning to become pregnant. If you drink alcohol: Limit how much you have to: 0-1 drink a day for women. 0-2 drinks a day for men. Know how much alcohol is in your drink. In the [place] one drink equals one 12 oz bottle of beer (355 mL), one 5 oz glass of wine (148 mL), or one 1 oz glass of hard liquor (44 mL). Lifestyle  Work with your doctor to stay at a healthy weight or to lose weight. Ask your doctor what the best weight is for you. Get at least 30 minutes of exercise that causes your heart to beat faster (aerobic exercise) most days of the week. This may include walking, swimming, or biking. Get at least 30 minutes of exercise that strengthens your muscles (resistance exercise) at least 3 days a week. This may include lifting weights or doing Pilates. Do not smoke or use any products that contain nicotine or tobacco. If you need help quitting, ask your doctor. Check your  blood pressure at home as told by your doctor. Keep all follow-up visits. Medicines Take over-the-counter and prescription medicines only as told by your doctor. Follow directions carefully. Do not skip doses of blood pressure medicine. The medicine does not work as well if you skip doses. Skipping doses also puts you at  risk for problems. Ask your doctor about side effects or reactions to medicines that you should watch for. Contact a doctor if: You think you are having a reaction to the medicine you are taking. You have headaches that keep coming back. You feel dizzy. You have swelling in your ankles. You have trouble with your vision. Get help right away if: You get a very bad headache. You start to feel mixed up (confused). You feel weak or numb. You feel faint. You have very bad pain in your: Chest. Belly (abdomen). You vomit more than once. You have trouble breathing. These symptoms may be an emergency. Get help right away. Call 911. Do not wait to see if the symptoms will go away. Do not drive yourself to the hospital. Summary Hypertension is another name for high blood pressure. High blood pressure forces your heart to work harder to pump blood. For most people, a normal blood pressure is less than 120/80. Making healthy choices can help lower blood pressure. If your blood pressure does not get lower with healthy choices, you may need to take medicine. This information is not intended to replace advice given to you by your health care provider. Make sure you discuss any questions you have with your health care provider. Document Revised: 11/04/2020 Document Reviewed: 11/04/2020 Elsevier Patient Education  2024 [employer].

## 2022-08-30 ENCOUNTER — Encounter: Payer: Self-pay | Admitting: Hematology & Oncology

## 2022-08-30 ENCOUNTER — Inpatient Hospital Stay: Payer: Medicare Other

## 2022-08-30 ENCOUNTER — Inpatient Hospital Stay: Payer: Medicare Other | Attending: Hematology & Oncology

## 2022-08-30 ENCOUNTER — Inpatient Hospital Stay: Payer: Medicare Other | Admitting: Hematology & Oncology

## 2022-08-30 VITALS — BP 104/67 | HR 76 | Temp 98.2°F | Resp 18 | Wt 175.0 lb

## 2022-08-30 DIAGNOSIS — Z9484 Stem cells transplant status: Secondary | ICD-10-CM | POA: Insufficient documentation

## 2022-08-30 DIAGNOSIS — M25561 Pain in right knee: Secondary | ICD-10-CM | POA: Insufficient documentation

## 2022-08-30 DIAGNOSIS — Z79811 Long term (current) use of aromatase inhibitors: Secondary | ICD-10-CM | POA: Diagnosis not present

## 2022-08-30 DIAGNOSIS — D500 Iron deficiency anemia secondary to blood loss (chronic): Secondary | ICD-10-CM | POA: Diagnosis not present

## 2022-08-30 DIAGNOSIS — D509 Iron deficiency anemia, unspecified: Secondary | ICD-10-CM | POA: Insufficient documentation

## 2022-08-30 DIAGNOSIS — Z923 Personal history of irradiation: Secondary | ICD-10-CM | POA: Diagnosis not present

## 2022-08-30 DIAGNOSIS — C9000 Multiple myeloma not having achieved remission: Secondary | ICD-10-CM

## 2022-08-30 DIAGNOSIS — C9001 Multiple myeloma in remission: Secondary | ICD-10-CM

## 2022-08-30 DIAGNOSIS — Z8542 Personal history of malignant neoplasm of other parts of uterus: Secondary | ICD-10-CM | POA: Insufficient documentation

## 2022-08-30 DIAGNOSIS — H538 Other visual disturbances: Secondary | ICD-10-CM | POA: Insufficient documentation

## 2022-08-30 DIAGNOSIS — C541 Malignant neoplasm of endometrium: Secondary | ICD-10-CM

## 2022-08-30 LAB — IRON AND IRON BINDING CAPACITY (CC-WL,HP ONLY)
Iron: 74 ug/dL (ref 28–170)
Saturation Ratios: 23 % (ref 10.4–31.8)
TIBC: 325 ug/dL (ref 250–450)
UIBC: 251 ug/dL (ref 148–442)

## 2022-08-30 LAB — CBC WITH DIFFERENTIAL (CANCER CENTER ONLY)
Abs Immature Granulocytes: 0.01 10*3/uL (ref 0.00–0.07)
Basophils Absolute: 0 10*3/uL (ref 0.0–0.1)
Basophils Relative: 1 %
Eosinophils Absolute: 0.1 10*3/uL (ref 0.0–0.5)
Eosinophils Relative: 2 %
HCT: 33 % — ABNORMAL LOW (ref 36.0–46.0)
Hemoglobin: 10.3 g/dL — ABNORMAL LOW (ref 12.0–15.0)
Immature Granulocytes: 0 %
Lymphocytes Relative: 16 %
Lymphs Abs: 0.6 10*3/uL — ABNORMAL LOW (ref 0.7–4.0)
MCH: 25.1 pg — ABNORMAL LOW (ref 26.0–34.0)
MCHC: 31.2 g/dL (ref 30.0–36.0)
MCV: 80.3 fL (ref 80.0–100.0)
Monocytes Absolute: 0.4 10*3/uL (ref 0.1–1.0)
Monocytes Relative: 10 %
Neutro Abs: 2.4 10*3/uL (ref 1.7–7.7)
Neutrophils Relative %: 71 %
Platelet Count: 157 10*3/uL (ref 150–400)
RBC: 4.11 MIL/uL (ref 3.87–5.11)
RDW: 18.8 % — ABNORMAL HIGH (ref 11.5–15.5)
WBC Count: 3.5 10*3/uL — ABNORMAL LOW (ref 4.0–10.5)
nRBC: 0 % (ref 0.0–0.2)

## 2022-08-30 LAB — CMP (CANCER CENTER ONLY)
ALT: 14 U/L (ref 0–44)
AST: 13 U/L — ABNORMAL LOW (ref 15–41)
Albumin: 4.2 g/dL (ref 3.5–5.0)
Alkaline Phosphatase: 43 U/L (ref 38–126)
Anion gap: 8 (ref 5–15)
BUN: 18 mg/dL (ref 8–23)
CO2: 27 mmol/L (ref 22–32)
Calcium: 9.6 mg/dL (ref 8.9–10.3)
Chloride: 105 mmol/L (ref 98–111)
Creatinine: 0.96 mg/dL (ref 0.44–1.00)
GFR, Estimated: >60 mL/min (ref >60)
Glucose, Bld: 101 mg/dL — ABNORMAL HIGH (ref 70–99)
Potassium: 3.9 mmol/L (ref 3.5–5.1)
Sodium: 140 mmol/L (ref 135–145)
Total Bilirubin: 0.3 mg/dL (ref 0.3–1.2)
Total Protein: 7 g/dL (ref 6.5–8.1)

## 2022-08-30 LAB — LACTATE DEHYDROGENASE: LDH: 149 U/L (ref 98–192)

## 2022-08-30 LAB — FERRITIN: Ferritin: 1090 ng/mL — ABNORMAL HIGH (ref 11–307)

## 2022-08-30 MED ORDER — DENOSUMAB 120 MG/1.7ML ~~LOC~~ SOLN
120.0000 mg | Freq: Once | SUBCUTANEOUS | Status: AC
  Administered 2022-08-30: 120 mg via SUBCUTANEOUS
  Filled 2022-08-30: qty 1.7

## 2022-08-30 MED ORDER — IMMUNE GLOBULIN (HUMAN) 10 GM/100ML IV SOLN
40.0000 g | Freq: Once | INTRAVENOUS | Status: AC
  Administered 2022-08-30: 40 g via INTRAVENOUS
  Filled 2022-08-30: qty 400

## 2022-08-30 MED ORDER — ACETAMINOPHEN 325 MG PO TABS
650.0000 mg | ORAL_TABLET | Freq: Once | ORAL | Status: AC
  Administered 2022-08-30: 650 mg via ORAL
  Filled 2022-08-30: qty 2

## 2022-08-30 MED ORDER — SODIUM CHLORIDE 0.9% FLUSH
10.0000 mL | INTRAVENOUS | Status: DC | PRN
  Administered 2022-08-30: 10 mL

## 2022-08-30 MED ORDER — DEXTROSE 5 % IV SOLN: INTRAVENOUS | Status: DC

## 2022-08-30 MED ORDER — DIPHENHYDRAMINE HCL 25 MG PO CAPS
25.0000 mg | ORAL_CAPSULE | Freq: Once | ORAL | Status: AC
  Administered 2022-08-30: 25 mg via ORAL
  Filled 2022-08-30: qty 1

## 2022-08-30 MED ORDER — HEPARIN SOD (PORK) LOCK FLUSH 100 UNIT/ML IV SOLN
500.0000 [IU] | Freq: Once | INTRAVENOUS | Status: AC | PRN
  Administered 2022-08-30: 500 [IU]

## 2022-08-30 MED ORDER — EPOETIN ALFA-EPBX 40000 UNIT/ML IJ SOLN
40000.0000 [IU] | Freq: Once | INTRAMUSCULAR | Status: AC
  Administered 2022-08-30: 40000 [IU] via SUBCUTANEOUS
  Filled 2022-08-30: qty 1

## 2022-08-30 NOTE — Patient Instructions (Signed)

## 2022-08-30 NOTE — Progress Notes (Signed)
She is Hematology and Oncology Follow Up Visit  Emily Greer [id number] 12/21/1950 72 y.o. 08/30/2022   Principle Diagnosis:  Recurrent lambda light chain myeloma - nl cytogenetics History of recurrent endometrial carcinoma Iron deficiency anemia -blood loss   Past Therapy:             Status post second autologous stem cell transplant on 07/24/2014 Maintenance therapy with Pomalidomide/every 2 week Velcade - d/c'ed Radiation therapy for endometrial recurrence - completed 04/20/2015 Pomalyst/Kyprolis 70mg /m2 IV q 2 weeks - s/p cycle #12 - held on 12/26/2017 for hematuria Daratumumab/Pomalyst (1 mg) - status post cycle 19 -- d/c on 08/21/2019 Melflufen 40 mg IV q 4 weeks -- started on 08/27/2019, s/p cycle #2 --  D/c due to FDA removal Selinexor 60 mg po q week -- start on 01/19/2020 -- changed on 03/18/2020 -- d/c on 04/27/2020   Current Therapy:        Blenrep 2.5 mg/m2 IV q 3 weeks -- started on 05/20/2020, s/p cycle #7 --D/C secondary to progression on 12/22/2020 Carfilzomib/Sarclisa -- s/p cycle #3 -- start on 01/03/2021 Femara 2.5 mg po q day  IV iron-Feraheme given on 07/15/2021. Cytoxan/Pomalyst/decadron -- start cycle #1 on 06/15/2021 - d/c 06/2021 S/p Cilta-cel CAR-T infusion -- 07/22/2021 Xgeva 120 mg IM q 3 months -- next dose 10/2022 Retacrit 40,000 units sq q 4 weeks for Hgb < 11 IVIG 40g IV q 6 weeks -- start on 11/16/2021   Interim History:  Emily Greer is here today for follow-up.  She has had a fairly busy summer.  Her son and his family had moved down from [place].  This is a lot easier for her now.  Her 2 grandsons are a whole lot closer.  She does have a little bit of pain with the right knee.  There is no swelling.  It does not her all the time.  When we last saw her, her iron studies showed a ferritin of 1470 with an iron saturation of 21%.  Her light chains have been incredibly low.  Her lambda light chain was less than 0.2.  She has had no  problem with fever.  Her IgG level back in June was 553 mg/dL.  I think she is responding to the Retacrit.  Her hemoglobin is slowly going up.  She has had no headache.  She has had really no leg swelling.  There is been no bleeding.  Overall, I would say that her performance status is probably ECOG 1.    Medications:  Allergies as of 08/30/2022       Reactions   Codeine Nausea Only        Medication List        Accurate as of August 30, 2022  9:51 AM. If you have any questions, ask your nurse or doctor.          acyclovir 400 MG tablet Commonly known as: ZOVIRAX Take 1 tablet (400 mg total) by mouth 2 (two) times daily.   albuterol 108 (90 Base) MCG/ACT inhaler Commonly known as: VENTOLIN HFA Inhale 2 puffs into the lungs every 6 (six) hours as needed for wheezing or shortness of breath. 2 puffs 3 times daily x 5 days then every 6 hours as needed.   amLODipine 10 MG tablet Commonly known as: NORVASC TAKE 1 TABLET(10 MG) BY MOUTH EVERY MORNING   aspirin EC 81 MG tablet Take 81 mg by mouth daily.   betamethasone valerate ointment 0.1 % Commonly known  as: VALISONE Apply 1 Application topically daily.   calcium-vitamin D 500-5 MG-MCG tablet Commonly known as: OSCAL WITH D Take 1 tablet by mouth daily.   cetirizine 10 MG tablet Commonly known as: ZYRTEC Take 1 tablet (10 mg total) by mouth at bedtime.   chlorhexidine 0.12 % solution Commonly known as: PERIDEX 10 mLs 2 (two) times daily.   ibuprofen 800 MG tablet Commonly known as: ADVIL Take 800 mg by mouth 3 (three) times daily.   letrozole 2.5 MG tablet Commonly known as: Femara Take 1 tablet (2.5 mg total) by mouth daily.   levETIRAcetam 500 MG tablet Commonly known as: KEPPRA Take 500 mg by mouth 2 (two) times daily.   metoprolol tartrate 50 MG tablet Commonly known as: LOPRESSOR Take 1/2 tablet by mouth twice daily   penicillin v potassium 500 MG tablet Commonly known as: VEETID Take 500 mg by  mouth 4 (four) times daily. Prophylaxis dental procedure   potassium chloride SA 20 MEQ tablet Commonly known as: KLOR-CON M Take 1 tablet (20 mEq total) by mouth 2 (two) times daily.   triamcinolone 55 MCG/ACT Aero nasal inhaler Commonly known as: NASACORT Place 2 sprays into the nose as needed.   Vitamin D3 50 MCG (2000 UT) Tabs Take 2 tablets by mouth daily.   Zinc Gluconate 50 MG Caps Take 1 tablet by mouth daily.        Allergies:  Allergies  Allergen Reactions   Codeine Nausea Only    Past Medical History, Surgical history, Social history, and Family History were reviewed and updated.  Review of Systems: Review of Systems  Constitutional: Negative.   HENT: Negative.    Eyes:  Positive for blurred vision.  Respiratory: Negative.    Cardiovascular: Negative.   Gastrointestinal: Negative.   Genitourinary: Negative.   Musculoskeletal: Negative.   Skin: Negative.   Neurological: Negative.   Endo/Heme/Allergies: Negative.   Psychiatric/Behavioral: Negative.       Physical Exam:  weight is 175 lb (79.4 kg). Her oral temperature is 98.2 F (36.8 C). Her blood pressure is 104/67 and her pulse is 76. Her respiration is 18 and oxygen saturation is 100%.   Wt Readings from Last 3 Encounters:  08/30/22 175 lb (79.4 kg)  08/29/22 173 lb 9.6 oz (78.7 kg)  08/15/22 173 lb (78.5 kg)    Physical Exam Vitals reviewed.  HENT:     Head: Normocephalic and atraumatic.  Eyes:     Pupils: Pupils are equal, round, and reactive to light.  Cardiovascular:     Rate and Rhythm: Normal rate and regular rhythm.     Heart sounds: Normal heart sounds.  Pulmonary:     Effort: Pulmonary effort is normal.     Breath sounds: Normal breath sounds.  Abdominal:     General: Bowel sounds are normal.     Palpations: Abdomen is soft.  Musculoskeletal:        General: No tenderness or deformity. Normal range of motion.     Cervical back: Normal range of motion.  Lymphadenopathy:      Cervical: No cervical adenopathy.  Skin:    General: Skin is warm and dry.     Findings: No erythema or rash.  Neurological:     Mental Status: She is alert and oriented to person, place, and time.  Psychiatric:        Behavior: Behavior normal.        Thought Content: Thought content normal.  Judgment: Judgment normal.      Lab Results  Component Value Date   WBC 3.5 (L) 08/30/2022   HGB 10.3 (L) 08/30/2022   HCT 33.0 (L) 08/30/2022   MCV 80.3 08/30/2022   PLT 157 08/30/2022   Lab Results  Component Value Date   FERRITIN 1,470 (H) 07/20/2022   IRON 65 07/20/2022   TIBC 307 07/20/2022   UIBC 242 07/20/2022   IRONPCTSAT 21 07/20/2022   Lab Results  Component Value Date   RETICCTPCT 1.3 07/20/2022   RBC 4.11 08/30/2022   Lab Results  Component Value Date   KPAFRELGTCHN <0.7 (L) 07/20/2022   LAMBDASER <1.5 (L) 07/20/2022   KAPLAMBRATIO UNABLE TO CALCULATE 07/20/2022   Lab Results  Component Value Date   IGGSERUM 553 (L) 07/20/2022   IGA <5 (L) 07/20/2022   IGMSERUM 12 (L) 07/20/2022   Lab Results  Component Value Date   TOTALPROTELP 6.2 06/08/2022   ALBUMINELP 3.6 06/08/2022   A1GS 0.2 06/08/2022   A2GS 1.0 06/08/2022   BETS 1.0 06/08/2022   BETA2SER 0.4 11/23/2014   GAMS 0.3 (L) 06/08/2022   MSPIKE Not Observed 06/08/2022   SPEI Comment 10/19/2020     Chemistry      Component Value Date/Time   NA 140 08/30/2022 0905   NA 142 07/04/2022 1248   NA 141 01/10/2017 1115   NA 140 06/21/2016 0918   K 3.9 08/30/2022 0905   K 4.0 01/10/2017 1115   K 4.3 06/21/2016 0918   CL 105 08/30/2022 0905   CL 106 01/10/2017 1115   CO2 27 08/30/2022 0905   CO2 27 01/10/2017 1115   CO2 20 (L) 06/21/2016 0918   BUN 18 08/30/2022 0905   BUN 12 07/04/2022 1248   BUN 15 01/10/2017 1115   BUN 15.8 06/21/2016 0918   CREATININE 0.96 08/30/2022 0905   CREATININE 1.0 01/10/2017 1115   CREATININE 0.8 06/21/2016 0918      Component Value Date/Time   CALCIUM 9.6  08/30/2022 0905   CALCIUM 9.5 01/10/2017 1115   CALCIUM 9.4 06/21/2016 0918   ALKPHOS 43 08/30/2022 0905   ALKPHOS 40 01/10/2017 1115   ALKPHOS 66 06/21/2016 0918   AST 13 (L) 08/30/2022 0905   AST 17 06/21/2016 0918   ALT 14 08/30/2022 0905   ALT 19 01/10/2017 1115   ALT 37 06/21/2016 0918   BILITOT 0.3 08/30/2022 0905   BILITOT 0.32 06/21/2016 0918       Impression and Plan: [name] is a very pleasant 72 yo African American female with recurrent lambda light chain myeloma.  She had her second stem cell transplant for light chain myeloma was in June 2016.   Everything is looking quite good.  I am sure that the CAR-T therapy is keeping her in remission.  I am just very happy about that.  It is now been over a year that she had her CAR-T therapy.  On IVIG to try to help prevent infections.  So far, this is helping.  Her IgG level is slowly increasing.  She will get her Retacrit today.  She will get her Rivka Barbara next time that we see her.  I will plan to get her back in 6 weeks.   Josph Macho, MD 7/31/20249:51 AM

## 2022-08-31 ENCOUNTER — Other Ambulatory Visit: Payer: Medicare Other | Admitting: Pharmacist

## 2022-09-03 NOTE — Assessment & Plan Note (Signed)
Most recent CBC results reviewed in detail. She is also followed by Oncology. No intervention on my end.

## 2022-09-03 NOTE — Assessment & Plan Note (Addendum)
Chronic, most recent Oncology notes reviewed. Also followed by [facility] in [place], [place].  These notes reviewed as well.

## 2022-09-03 NOTE — Assessment & Plan Note (Signed)
Her BMI is acceptable for her demographic. Encouraged to aim for at least 150 minutes of exercise per week

## 2022-09-03 NOTE — Assessment & Plan Note (Addendum)
Uncontrolled. She will c/w amlodipine 10mg  in AM. I will INCREASE metoprolol to 1/2 tablet twice daily. She will f/u in two weeks for a nurse visit. She is encouraged to take ALL meds as prescribed.

## 2022-09-05 ENCOUNTER — Ambulatory Visit: Payer: Medicare Other

## 2022-09-05 VITALS — BP 120/70 | HR 64 | Temp 98.4°F | Ht 63.0 in | Wt 175.0 lb

## 2022-09-05 DIAGNOSIS — I10 Essential (primary) hypertension: Secondary | ICD-10-CM

## 2022-09-05 NOTE — Progress Notes (Signed)
Patient presents today for a bp check. During her last visit she was advised to continue with amlodipine 10mg  in the morning and she was to increase metoprolol to 1/2 tablet twice daily. I checked her blood pressure today and it was 120/70 p64. Patient reports she is feeling fine today. After speaking with provider pt was advised to continue with her current medications and keep her next appt as scheduled with provider.   BP Readings from Last 3 Encounters:  08/30/22 104/67  08/29/22 136/80  08/15/22 138/70

## 2022-09-12 ENCOUNTER — Ambulatory Visit: Payer: Medicare Other

## 2022-09-27 ENCOUNTER — Ambulatory Visit (INDEPENDENT_AMBULATORY_CARE_PROVIDER_SITE_OTHER): Payer: Medicare Other | Admitting: Family Medicine

## 2022-09-27 ENCOUNTER — Encounter: Payer: Self-pay | Admitting: Family Medicine

## 2022-09-27 VITALS — BP 140/70 | HR 85 | Temp 98.5°F | Ht 63.0 in | Wt 175.6 lb

## 2022-09-27 DIAGNOSIS — L509 Urticaria, unspecified: Secondary | ICD-10-CM

## 2022-09-27 DIAGNOSIS — R059 Cough, unspecified: Secondary | ICD-10-CM

## 2022-09-27 MED ORDER — BETAMETHASONE VALERATE 0.1 % EX OINT
1.0000 | TOPICAL_OINTMENT | Freq: Two times a day (BID) | CUTANEOUS | 0 refills | Status: AC | PRN
Start: 2022-09-27 — End: 2022-10-02

## 2022-09-27 MED ORDER — METHYLPREDNISOLONE 4 MG PO TBPK: ORAL_TABLET | ORAL | 0 refills | Status: DC

## 2022-09-27 MED ORDER — BENZONATATE 100 MG PO CAPS
100.0000 mg | ORAL_CAPSULE | Freq: Three times a day (TID) | ORAL | 0 refills | Status: AC | PRN
Start: 2022-09-27 — End: 2022-10-07

## 2022-09-27 NOTE — Progress Notes (Signed)
[name], CMA,acting as a Neurosurgeon for Emily Hose, NP.,have documented all relevant documentation on the behalf of Emily Hose, NP,as directed by  Emily Hose, NP while in the presence of Emily Hose, NP.  Subjective:  Patient ID: Emily Greer , female    DOB: 01/30/1951 , 72 y.o.   MRN: [id number]  Chief Complaint  Patient presents with   Urticaria   Cough    HPI  Patient presents today for hives on her arms and chest area, states that it has been ongoing for 2 weeks , denies any new soap or clothing or fragrance that might be the irritant. Patient has a productive cough also  with clear sputum/phlegm. Patient reports when she takes cough medications it goes away.  Patient is currently on Augmentin 875/125mg  BID for 10 days d/t dental day, she was put on it by Dr Lysbeth Galas Surgeon, she started on 09/18/2022.  BP Readings from Last 3 Encounters: 09/27/22 : (!) 140/70 09/05/22 : 120/70 08/30/22 : 104/67        Past Medical History:  Diagnosis Date   Avascular necrosis of femoral head (HCC)    bilateral per CT 07-26-2015   Endometrial carcinoma [facility]) gyn oncologist-  dr Nelly Rout ([facility])/  radiation oncologist-- dr Roselind Messier   2013 dx  FIGO Stage 1A, Grade 2 endometrioid endometrial cancer s/p TAH w/ BSO and bilateral pelvic node dissection 10-31-2011 ;  recurrence at distal vagina 04/ 2014 s/p  brachytherapy (ended 07-29-2012);  2nd recurrence 12/ 2016  vaginal apex s/p  conformational radiotherapy 03-10-2015 to 04-20-2015   Family history of adverse reaction to anesthesia    mother ponv   GERD (gastroesophageal reflux disease)    Goals of care, counseling/discussion 08/21/2019   H/O stem cell transplant (HCC)    02/ 2000 and second one 06/ 2016   History of bacteremia    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   History of chemotherapy    last chemo 12-26-17   History of radiation therapy 6/4, 6/11, 6/19, 6/25, 07/29/2012   vagina 30.5 gray  in 5 fx, HDR brachytherapy:   last radiation to vagina 03-10-2015 to 04-20-2015  50.4gray   History of radiation therapy 03/10/15-04/20/15   vagina 50.4 in 28 fractions   Hypertension    Lambda light chain myeloma [facility]) oncologist-  dr Myna Hidalgo ([facility])  and  [facility] -- dr Silvestre Mesi gasparetto   dx 07/ 1999 s/p  VAD chemotherapy 11/ 1999,  purged autotransplant 03-21-1998 followed by auto stem cell transplant 03-29-1998;  recurrance w/ second autologous stem cell transplant 07-24-2014;  in Re-mission currently , chemo maintenance therapy   Osteoporosis 12/18/05   Increased  risk    PONV (postoperative nausea and vomiting)    Wears glasses      Family History  Problem Relation Age of Onset   Colon cancer Mother    Hypertension Father    Heart Problems Father      Current Outpatient Medications:    acyclovir (ZOVIRAX) 400 MG tablet, Take 1 tablet (400 mg total) by mouth 2 (two) times daily., Disp: 60 tablet, Rfl: 6   amLODipine (NORVASC) 10 MG tablet, TAKE 1 TABLET(10 MG) BY MOUTH EVERY MORNING, Disp: 90 tablet, Rfl: 1   amoxicillin-clavulanate (AUGMENTIN) 875-125 MG tablet, Take 1 tablet by mouth 2 (two) times daily. From dentist., Disp: , Rfl:    aspirin EC 81 MG tablet, Take 81 mg by mouth daily., Disp: , Rfl:  benzonatate (TESSALON PERLES) 100 MG capsule, Take 1 capsule (100 mg total) by mouth 3 (three) times daily as needed for up to 10 days for cough., Disp: 30 capsule, Rfl: 0   calcium-vitamin D (OSCAL WITH D) 500-5 MG-MCG tablet, Take 1 tablet by mouth daily., Disp: , Rfl:    cetirizine (ZYRTEC) 10 MG tablet, Take 1 tablet (10 mg total) by mouth at bedtime., Disp: 90 tablet, Rfl: 1   chlorhexidine (PERIDEX) 0.12 % solution, 10 mLs 2 (two) times daily., Disp: , Rfl:    Cholecalciferol (VITAMIN D3) 2000 UNITS TABS, Take 2 tablets by mouth daily., Disp: , Rfl:    methylPREDNISolone (MEDROL DOSEPAK) 4 MG TBPK tablet, Use as directed, Disp: 21 tablet, Rfl: 0   metoprolol  tartrate (LOPRESSOR) 50 MG tablet, Take 1/2 tablet by mouth twice daily, Disp: 90 tablet, Rfl: 1   potassium chloride SA (KLOR-CON M) 20 MEQ tablet, Take 1 tablet (20 mEq total) by mouth 2 (two) times daily., Disp: 60 tablet, Rfl: 3   triamcinolone (NASACORT) 55 MCG/ACT AERO nasal inhaler, Place 2 sprays into the nose as needed., Disp: , Rfl:    Zinc Gluconate 50 MG CAPS, Take 1 tablet by mouth daily., Disp: , Rfl:    albuterol (VENTOLIN HFA) 108 (90 Base) MCG/ACT inhaler, Inhale 2 puffs into the lungs every 6 (six) hours as needed for wheezing or shortness of breath. 2 puffs 3 times daily x 5 days then every 6 hours as needed. (Patient not taking: Reported on 02/23/2022), Disp: 18 g, Rfl: 11   ibuprofen (ADVIL) 800 MG tablet, Take 800 mg by mouth 3 (three) times daily. (Patient not taking: Reported on 02/23/2022), Disp: , Rfl:    letrozole (FEMARA) 2.5 MG tablet, Take 1 tablet (2.5 mg total) by mouth daily. (Patient not taking: Reported on 08/30/2022), Disp: 30 tablet, Rfl: 12   levETIRAcetam (KEPPRA) 500 MG tablet, Take 500 mg by mouth 2 (two) times daily. (Patient not taking: Reported on 07/19/2022), Disp: , Rfl:    penicillin v potassium (VEETID) 500 MG tablet, Take 500 mg by mouth 4 (four) times daily. Prophylaxis dental procedure (Patient not taking: Reported on 01/19/2022), Disp: , Rfl:  No current facility-administered medications for this visit.  Facility-Administered Medications Ordered in Other Visits:    0.9 %  sodium chloride infusion, , Intravenous, Once PRN, Ennever, Rose Phi, MD   albuterol (PROVENTIL) (2.5 MG/3ML) 0.083% nebulizer solution 2.5 mg, 2.5 mg, Nebulization, Once PRN, Ennever, Rose Phi, MD   sodium chloride flush (NS) 0.9 % injection 10 mL, 10 mL, Intravenous, PRN, Erenest Blank, NP, 10 mL at 12/26/17 1130   sodium chloride flush (NS) 0.9 % injection 10 mL, 10 mL, Intravenous, PRN, Josph Macho, MD, 10 mL at 04/24/19 1305   sodium chloride flush (NS) 0.9 % injection 10 mL,  10 mL, Intravenous, PRN, Josph Macho, MD, 10 mL at 08/21/19 1233   sodium chloride flush (NS) 0.9 % injection 10 mL, 10 mL, Intracatheter, PRN, Erenest Blank, NP, 10 mL at 10/01/19 1053   sodium chloride flush (NS) 0.9 % injection 10 mL, 10 mL, Intracatheter, PRN, Erenest Blank, NP, 10 mL at 12/11/19 1414   sodium chloride flush (NS) 0.9 % injection 10 mL, 10 mL, Intravenous, PRN, Erenest Blank, NP, 10 mL at 07/06/21 1105   sodium chloride flush (NS) 0.9 % injection 10 mL, 10 mL, Intracatheter, PRN, Erenest Blank, NP, 10 mL at 07/14/21 0931   Allergies  Allergen  Reactions   Codeine Nausea Only     Review of Systems  Constitutional: Negative.   HENT:  Positive for postnasal drip and rhinorrhea.   Respiratory:  Positive for cough.   Neurological: Negative.      Today's Vitals   09/27/22 1445  BP: (!) 140/70  Pulse: 85  Temp: 98.5 F (36.9 C)  TempSrc: Oral  Weight: 175 lb 9.6 oz (79.7 kg)  Height: 5\' 3"  (1.6 m)  PainSc: 0-No pain   Body mass index is 31.11 kg/m.  Wt Readings from Last 3 Encounters:  09/27/22 175 lb 9.6 oz (79.7 kg)  09/05/22 175 lb (79.4 kg)  08/30/22 175 lb (79.4 kg)      Objective:  Physical Exam Cardiovascular:     Rate and Rhythm: Normal rate.  Pulmonary:     Effort: Pulmonary effort is normal.     Breath sounds: Normal breath sounds.  Skin:    General: Skin is dry.     Findings: Erythema and rash present.  Neurological:     Mental Status: She is alert.         Assessment And Plan:  Hives Assessment & Plan: Apply cream to affected areas BID for 5 days only  Orders: -     Betamethasone Valerate; Apply 1 Application topically 2 (two) times daily as needed for up to 5 days.  Dispense: 10 g; Refill: 0 -     methylPREDNISolone; Use as directed  Dispense: 21 tablet; Refill: 0  Cough in adult -     Benzonatate; Take 1 capsule (100 mg total) by mouth 3 (three) times daily as needed for up to 10 days for cough.  Dispense: 30  capsule; Refill: 0    No follow-ups on file.  Patient was given opportunity to ask questions. Patient verbalized understanding of the plan and was able to repeat key elements of the plan. All questions were answered to their satisfaction.    I, Emily Hose, NP, have reviewed all documentation for this visit. The documentation on 10/03/22 for the exam, diagnosis, procedures, and orders are all accurate and complete.   IF YOU HAVE BEEN REFERRED TO A SPECIALIST, IT MAY TAKE 1-2 WEEKS TO SCHEDULE/PROCESS THE REFERRAL. IF YOU HAVE NOT HEARD FROM US/SPECIALIST IN TWO WEEKS, PLEASE GIVE [place] A CALL AT [phone].

## 2022-10-03 ENCOUNTER — Encounter: Payer: Self-pay | Admitting: Pharmacist

## 2022-10-03 NOTE — Assessment & Plan Note (Signed)
Apply cream to affected areas BID for 5 days only

## 2022-10-04 ENCOUNTER — Other Ambulatory Visit: Payer: Medicare Other | Admitting: Pharmacist

## 2022-10-04 DIAGNOSIS — M87180 Osteonecrosis due to drugs, jaw: Secondary | ICD-10-CM | POA: Diagnosis not present

## 2022-10-11 ENCOUNTER — Encounter: Payer: Self-pay | Admitting: Hematology & Oncology

## 2022-10-11 ENCOUNTER — Inpatient Hospital Stay: Payer: Medicare Other | Attending: Hematology & Oncology

## 2022-10-11 ENCOUNTER — Inpatient Hospital Stay: Payer: Medicare Other

## 2022-10-11 ENCOUNTER — Inpatient Hospital Stay (HOSPITAL_BASED_OUTPATIENT_CLINIC_OR_DEPARTMENT_OTHER): Payer: Medicare Other | Admitting: Hematology & Oncology

## 2022-10-11 ENCOUNTER — Telehealth: Payer: Self-pay | Admitting: *Deleted

## 2022-10-11 VITALS — BP 131/72 | HR 66 | Temp 99.4°F | Resp 20 | Ht 63.0 in | Wt 180.1 lb

## 2022-10-11 DIAGNOSIS — C541 Malignant neoplasm of endometrium: Secondary | ICD-10-CM

## 2022-10-11 DIAGNOSIS — Z923 Personal history of irradiation: Secondary | ICD-10-CM | POA: Diagnosis not present

## 2022-10-11 DIAGNOSIS — M7989 Other specified soft tissue disorders: Secondary | ICD-10-CM | POA: Insufficient documentation

## 2022-10-11 DIAGNOSIS — D509 Iron deficiency anemia, unspecified: Secondary | ICD-10-CM | POA: Diagnosis not present

## 2022-10-11 DIAGNOSIS — Z885 Allergy status to narcotic agent status: Secondary | ICD-10-CM | POA: Insufficient documentation

## 2022-10-11 DIAGNOSIS — Z79899 Other long term (current) drug therapy: Secondary | ICD-10-CM | POA: Insufficient documentation

## 2022-10-11 DIAGNOSIS — C9000 Multiple myeloma not having achieved remission: Secondary | ICD-10-CM | POA: Insufficient documentation

## 2022-10-11 DIAGNOSIS — Z9484 Stem cells transplant status: Secondary | ICD-10-CM | POA: Insufficient documentation

## 2022-10-11 DIAGNOSIS — D500 Iron deficiency anemia secondary to blood loss (chronic): Secondary | ICD-10-CM

## 2022-10-11 DIAGNOSIS — H538 Other visual disturbances: Secondary | ICD-10-CM | POA: Insufficient documentation

## 2022-10-11 DIAGNOSIS — Z8542 Personal history of malignant neoplasm of other parts of uterus: Secondary | ICD-10-CM | POA: Insufficient documentation

## 2022-10-11 DIAGNOSIS — R059 Cough, unspecified: Secondary | ICD-10-CM | POA: Diagnosis not present

## 2022-10-11 DIAGNOSIS — C9001 Multiple myeloma in remission: Secondary | ICD-10-CM

## 2022-10-11 LAB — CMP (CANCER CENTER ONLY)
ALT: 10 U/L (ref 0–44)
AST: 12 U/L — ABNORMAL LOW (ref 15–41)
Albumin: 3.9 g/dL (ref 3.5–5.0)
Alkaline Phosphatase: 42 U/L (ref 38–126)
Anion gap: 8 (ref 5–15)
BUN: 12 mg/dL (ref 8–23)
CO2: 26 mmol/L (ref 22–32)
Calcium: 9 mg/dL (ref 8.9–10.3)
Chloride: 107 mmol/L (ref 98–111)
Creatinine: 0.85 mg/dL (ref 0.44–1.00)
GFR, Estimated: >60 mL/min (ref >60)
Glucose, Bld: 99 mg/dL (ref 70–99)
Potassium: 3.5 mmol/L (ref 3.5–5.1)
Sodium: 141 mmol/L (ref 135–145)
Total Bilirubin: 0.3 mg/dL (ref 0.3–1.2)
Total Protein: 6.3 g/dL — ABNORMAL LOW (ref 6.5–8.1)

## 2022-10-11 LAB — FERRITIN: Ferritin: 979 ng/mL — ABNORMAL HIGH (ref 11–307)

## 2022-10-11 LAB — IRON AND IRON BINDING CAPACITY (CC-WL,HP ONLY)
Iron: 59 ug/dL (ref 28–170)
Saturation Ratios: 18 % (ref 10.4–31.8)
TIBC: 330 ug/dL (ref 250–450)
UIBC: 271 ug/dL (ref 148–442)

## 2022-10-11 LAB — CBC WITH DIFFERENTIAL (CANCER CENTER ONLY)
Abs Immature Granulocytes: 0.01 10*3/uL (ref 0.00–0.07)
Basophils Absolute: 0.1 10*3/uL (ref 0.0–0.1)
Basophils Relative: 1 %
Eosinophils Absolute: 0.2 10*3/uL (ref 0.0–0.5)
Eosinophils Relative: 4 %
HCT: 31.6 % — ABNORMAL LOW (ref 36.0–46.0)
Hemoglobin: 9.9 g/dL — ABNORMAL LOW (ref 12.0–15.0)
Immature Granulocytes: 0 %
Lymphocytes Relative: 11 %
Lymphs Abs: 0.5 10*3/uL — ABNORMAL LOW (ref 0.7–4.0)
MCH: 25.3 pg — ABNORMAL LOW (ref 26.0–34.0)
MCHC: 31.3 g/dL (ref 30.0–36.0)
MCV: 80.6 fL (ref 80.0–100.0)
Monocytes Absolute: 0.4 10*3/uL (ref 0.1–1.0)
Monocytes Relative: 9 %
Neutro Abs: 3.3 10*3/uL (ref 1.7–7.7)
Neutrophils Relative %: 75 %
Platelet Count: 155 10*3/uL (ref 150–400)
RBC: 3.92 MIL/uL (ref 3.87–5.11)
RDW: 20.6 % — ABNORMAL HIGH (ref 11.5–15.5)
WBC Count: 4.4 10*3/uL (ref 4.0–10.5)
nRBC: 0 % (ref 0.0–0.2)

## 2022-10-11 LAB — RETICULOCYTES
Immature Retic Fract: 22.4 % — ABNORMAL HIGH (ref 2.3–15.9)
RBC.: 3.9 MIL/uL (ref 3.87–5.11)
Retic Count, Absolute: 52.7 10*3/uL (ref 19.0–186.0)
Retic Ct Pct: 1.4 % (ref 0.4–3.1)

## 2022-10-11 LAB — LACTATE DEHYDROGENASE: LDH: 156 U/L (ref 98–192)

## 2022-10-11 MED ORDER — HEPARIN SOD (PORK) LOCK FLUSH 100 UNIT/ML IV SOLN
500.0000 [IU] | Freq: Once | INTRAVENOUS | Status: AC
  Administered 2022-10-11: 500 [IU] via INTRAVENOUS

## 2022-10-11 MED ORDER — SODIUM CHLORIDE 0.9% FLUSH
10.0000 mL | INTRAVENOUS | Status: DC | PRN
  Administered 2022-10-11: 10 mL via INTRAVENOUS

## 2022-10-11 MED ORDER — EPOETIN ALFA-EPBX 40000 UNIT/ML IJ SOLN
40000.0000 [IU] | Freq: Once | INTRAMUSCULAR | Status: AC
  Administered 2022-10-11: 40000 [IU] via SUBCUTANEOUS
  Filled 2022-10-11: qty 1

## 2022-10-11 NOTE — Progress Notes (Signed)
She is Hematology and Oncology Follow Up Visit  ELDRA ACHEY [id number] [birth year] 72 y.o. 10/11/2022   Principle Diagnosis:  Recurrent lambda light chain myeloma - nl cytogenetics History of recurrent endometrial carcinoma Iron deficiency anemia -blood loss   Past Therapy:             Status post second autologous stem cell transplant on 07/24/2014 Maintenance therapy with Pomalidomide/every 2 week Velcade - d/c'ed Radiation therapy for endometrial recurrence - completed 04/20/2015 Pomalyst/Kyprolis 70mg /m2 IV q 2 weeks - s/p cycle #12 - held on 12/26/2017 for hematuria Daratumumab/Pomalyst (1 mg) - status post cycle 19 -- d/c on 08/21/2019 Melflufen 40 mg IV q 4 weeks -- started on 08/27/2019, s/p cycle #2 --  D/c due to FDA removal Selinexor 60 mg po q week -- start on 01/19/2020 -- changed on 03/18/2020 -- d/c on 04/27/2020   Current Therapy:        Blenrep 2.5 mg/m2 IV q 3 weeks -- started on 05/20/2020, s/p cycle #7 --D/C secondary to progression on 12/22/2020 Carfilzomib/Sarclisa -- s/p cycle #3 -- start on 01/03/2021 IV iron-Feraheme given on 07/15/2021. Cytoxan/Pomalyst/decadron -- start cycle #1 on 06/15/2021 - d/c 06/2021 S/p Cilta-cel CAR-T infusion -- 07/22/2021 Retacrit 40,000 units sq q 4 weeks for Hgb < 11 IVIG 40g IV q 6 weeks -- start on 11/16/2021   Interim History:  Ms. Cardullo is here today for follow-up.  Unfortunately, the problem that we have is that she has developed some osteonecrosis of the jaw.  This was confirmed by her dentist.  She will need to have oral surgery to help remove some of the bone.  I suspect this probably is from the Yetter.  As such, we will have to stop this.  I do not see any problems with her having surgery.  The other problem is that she has a "cold".  She has a little bit of a temperature.  She has a lot of congestion.  She is coughing.  She has not yet tested for COVID.  She would test when she goes home.  She has had no  other issues.  She has had no change in bowel or bladder habits.  There is been no diarrhea..  She has had no rashes.  She had a little bit of leg swelling but this is chronic.  Her last lambda light chain in the serum was less than 0.2 mg/dL.  Her last IgG level back in July was 652 mg/dL.    She has had no problems with bleeding..  She is no longer on Femara.  Her last iron studies that were done back in July showed a ferritin of 1090 with an iron saturation of 23%.  Overall, I would say that her performance status for now is probably ECOG 1.   Medications:  Allergies as of 10/11/2022       Reactions   Codeine Nausea Only        Medication List        Accurate as of October 11, 2022 10:27 AM. If you have any questions, ask your nurse or doctor.          STOP taking these medications    ibuprofen 800 MG tablet Commonly known as: ADVIL Stopped by: Josph Macho   levETIRAcetam 500 MG tablet Commonly known as: KEPPRA Stopped by: Josph Macho   methylPREDNISolone 4 MG Tbpk tablet Commonly known as: MEDROL DOSEPAK Stopped by: Josph Macho  TAKE these medications    acyclovir 400 MG tablet Commonly known as: ZOVIRAX Take 1 tablet (400 mg total) by mouth 2 (two) times daily.   albuterol 108 (90 Base) MCG/ACT inhaler Commonly known as: VENTOLIN HFA Inhale 2 puffs into the lungs every 6 (six) hours as needed for wheezing or shortness of breath. 2 puffs 3 times daily x 5 days then every 6 hours as needed.   amLODipine 10 MG tablet Commonly known as: NORVASC TAKE 1 TABLET(10 MG) BY MOUTH EVERY MORNING   amoxicillin-clavulanate 875-125 MG tablet Commonly known as: AUGMENTIN Take 1 tablet by mouth 2 (two) times daily. What changed: Another medication with the same name was removed. Continue taking this medication, and follow the directions you see here. Changed by: Josph Macho   aspirin EC 81 MG tablet Take 81 mg by mouth daily.    calcium-vitamin D 500-5 MG-MCG tablet Commonly known as: OSCAL WITH D Take 1 tablet by mouth daily.   cetirizine 10 MG tablet Commonly known as: ZYRTEC Take 1 tablet (10 mg total) by mouth at bedtime.   chlorhexidine 0.12 % solution Commonly known as: PERIDEX 10 mLs 2 (two) times daily.   letrozole 2.5 MG tablet Commonly known as: Femara Take 1 tablet (2.5 mg total) by mouth daily.   metoprolol tartrate 50 MG tablet Commonly known as: LOPRESSOR Take 1/2 tablet by mouth twice daily   penicillin v potassium 500 MG tablet Commonly known as: VEETID Take 500 mg by mouth 4 (four) times daily. Prophylaxis dental procedure   potassium chloride SA 20 MEQ tablet Commonly known as: KLOR-CON M Take 1 tablet (20 mEq total) by mouth 2 (two) times daily.   triamcinolone 55 MCG/ACT Aero nasal inhaler Commonly known as: NASACORT Place 2 sprays into the nose as needed.   Vitamin D3 50 MCG (2000 UT) Tabs Take 2 tablets by mouth daily.   Zinc Gluconate 50 MG Caps Take 1 tablet by mouth daily.        Allergies:  Allergies  Allergen Reactions   Codeine Nausea Only    Past Medical History, Surgical history, Social history, and Family History were reviewed and updated.  Review of Systems: Review of Systems  Constitutional: Negative.   HENT: Negative.    Eyes:  Positive for blurred vision.  Respiratory: Negative.    Cardiovascular: Negative.   Gastrointestinal: Negative.   Genitourinary: Negative.   Musculoskeletal: Negative.   Skin: Negative.   Neurological: Negative.   Endo/Heme/Allergies: Negative.   Psychiatric/Behavioral: Negative.       Physical Exam:  height is 5\' 3"  (1.6 m) and weight is 180 lb 1.3 oz (81.7 kg). Her oral temperature is 99.4 F (37.4 C). Her blood pressure is 131/72 and her pulse is 66. Her respiration is 20 and oxygen saturation is 98%.   Wt Readings from Last 3 Encounters:  10/11/22 180 lb 1.3 oz (81.7 kg)  09/27/22 175 lb 9.6 oz (79.7 kg)   09/05/22 175 lb (79.4 kg)    Physical Exam Vitals reviewed.  HENT:     Head: Normocephalic and atraumatic.  Eyes:     Pupils: Pupils are equal, round, and reactive to light.  Cardiovascular:     Rate and Rhythm: Normal rate and regular rhythm.     Heart sounds: Normal heart sounds.  Pulmonary:     Effort: Pulmonary effort is normal.     Breath sounds: Normal breath sounds.  Abdominal:     General: Bowel sounds are normal.  Palpations: Abdomen is soft.  Musculoskeletal:        General: No tenderness or deformity. Normal range of motion.     Cervical back: Normal range of motion.  Lymphadenopathy:     Cervical: No cervical adenopathy.  Skin:    General: Skin is warm and dry.     Findings: No erythema or rash.  Neurological:     Mental Status: She is alert and oriented to person, place, and time.  Psychiatric:        Behavior: Behavior normal.        Thought Content: Thought content normal.        Judgment: Judgment normal.      Lab Results  Component Value Date   WBC 4.4 10/11/2022   HGB 9.9 (L) 10/11/2022   HCT 31.6 (L) 10/11/2022   MCV 80.6 10/11/2022   PLT 155 10/11/2022   Lab Results  Component Value Date   FERRITIN 1,090 (H) 08/30/2022   IRON 74 08/30/2022   TIBC 325 08/30/2022   UIBC 251 08/30/2022   IRONPCTSAT 23 08/30/2022   Lab Results  Component Value Date   RETICCTPCT 1.4 10/11/2022   RBC 3.92 10/11/2022   RBC 3.90 10/11/2022   Lab Results  Component Value Date   KPAFRELGTCHN 1.1 (L) 08/30/2022   LAMBDASER <1.5 (L) 08/30/2022   KAPLAMBRATIO >0.73 08/30/2022   Lab Results  Component Value Date   IGGSERUM 652 08/30/2022   IGA <5 (L) 08/30/2022   IGMSERUM 16 (L) 08/30/2022   Lab Results  Component Value Date   TOTALPROTELP 6.1 08/30/2022   ALBUMINELP 3.4 08/30/2022   A1GS 0.3 08/30/2022   A2GS 0.9 08/30/2022   BETS 1.0 08/30/2022   BETA2SER 0.4 11/23/2014   GAMS 0.6 08/30/2022   MSPIKE Not Observed 08/30/2022   SPEI Comment  10/19/2020     Chemistry      Component Value Date/Time   NA 141 10/11/2022 0930   NA 142 07/04/2022 1248   NA 141 01/10/2017 1115   NA 140 06/21/2016 0918   K 3.5 10/11/2022 0930   K 4.0 01/10/2017 1115   K 4.3 06/21/2016 0918   CL 107 10/11/2022 0930   CL 106 01/10/2017 1115   CO2 26 10/11/2022 0930   CO2 27 01/10/2017 1115   CO2 20 (L) 06/21/2016 0918   BUN 12 10/11/2022 0930   BUN 12 07/04/2022 1248   BUN 15 01/10/2017 1115   BUN 15.8 06/21/2016 0918   CREATININE 0.85 10/11/2022 0930   CREATININE 1.0 01/10/2017 1115   CREATININE 0.8 06/21/2016 0918      Component Value Date/Time   CALCIUM 9.0 10/11/2022 0930   CALCIUM 9.5 01/10/2017 1115   CALCIUM 9.4 06/21/2016 0918   ALKPHOS 42 10/11/2022 0930   ALKPHOS 40 01/10/2017 1115   ALKPHOS 66 06/21/2016 0918   AST 12 (L) 10/11/2022 0930   AST 17 06/21/2016 0918   ALT 10 10/11/2022 0930   ALT 19 01/10/2017 1115   ALT 37 06/21/2016 0918   BILITOT 0.3 10/11/2022 0930   BILITOT 0.32 06/21/2016 0918       Impression and Plan: Ms. Cheak is a very pleasant 72 yo African American female with recurrent lambda light chain myeloma.  She had her second stem cell transplant for light chain myeloma was in June 2016.   I hate the fact that she has this osteonecrosis.  Again, we will have to stop the Xgeva.  Clearly, the CAR-T therapy has helped quite a  bit.  I think we can hold off on the IVIG for right now.  I am sure that her IgG level should be adequate.  I will plan to get her back in about a month or so.  She will get her Retacrit today.  Hopefully, she will not have COVID.  Josph Macho, MD 9/11/202410:27 AM

## 2022-10-11 NOTE — Telephone Encounter (Signed)
Message received from pt to inform Dr. Myna Hidalgo that she tested negative for covid.  Dr. Myna Hidalgo notified. No new orders received at this time.

## 2022-10-11 NOTE — Patient Instructions (Signed)

## 2022-10-12 ENCOUNTER — Telehealth: Payer: Self-pay

## 2022-10-12 LAB — KAPPA/LAMBDA LIGHT CHAINS
Kappa free light chain: 3.3 mg/L (ref 3.3–19.4)
Kappa, lambda light chain ratio: 1.27 (ref 0.26–1.65)
Lambda free light chains: 2.6 mg/L — ABNORMAL LOW (ref 5.7–26.3)

## 2022-10-12 LAB — IGG, IGA, IGM
IgA: <5 mg/dL — ABNORMAL LOW (ref 87–352)
IgG (Immunoglobin G), Serum: 603 mg/dL (ref 586–1602)
IgM (Immunoglobulin M), Srm: 27 mg/dL (ref 26–217)

## 2022-10-12 NOTE — Telephone Encounter (Signed)
Called patient to inform her that her iron level is on the lower side and that she could benefit from a dose of IV iron per Dr. Myna Hidalgo. Patient said that she skipped a dose of IVIG d/t a head cold. Message sent to scheduling department to call patient and help her set up her infusion appointments.

## 2022-10-12 NOTE — Telephone Encounter (Signed)
-----   Message from Josph Macho sent at 10/11/2022  6:13 PM EDT ----- Please call and let her know that the iron level is on the lower side.  I think she would benefit from a dose of IV iron.  Please set this up.  Cindee Lame

## 2022-10-16 ENCOUNTER — Inpatient Hospital Stay: Payer: Medicare Other

## 2022-10-16 ENCOUNTER — Other Ambulatory Visit: Payer: Self-pay

## 2022-10-16 VITALS — BP 158/89 | HR 66 | Temp 98.4°F | Resp 17

## 2022-10-16 DIAGNOSIS — R059 Cough, unspecified: Secondary | ICD-10-CM | POA: Diagnosis not present

## 2022-10-16 DIAGNOSIS — H538 Other visual disturbances: Secondary | ICD-10-CM | POA: Diagnosis not present

## 2022-10-16 DIAGNOSIS — C541 Malignant neoplasm of endometrium: Secondary | ICD-10-CM

## 2022-10-16 DIAGNOSIS — C9001 Multiple myeloma in remission: Secondary | ICD-10-CM

## 2022-10-16 DIAGNOSIS — Z79899 Other long term (current) drug therapy: Secondary | ICD-10-CM | POA: Diagnosis not present

## 2022-10-16 DIAGNOSIS — M7989 Other specified soft tissue disorders: Secondary | ICD-10-CM | POA: Diagnosis not present

## 2022-10-16 DIAGNOSIS — D509 Iron deficiency anemia, unspecified: Secondary | ICD-10-CM | POA: Diagnosis not present

## 2022-10-16 DIAGNOSIS — C9000 Multiple myeloma not having achieved remission: Secondary | ICD-10-CM | POA: Diagnosis not present

## 2022-10-16 MED ORDER — HEPARIN SOD (PORK) LOCK FLUSH 100 UNIT/ML IV SOLN: 250.0000 [IU] | Freq: Once | INTRAVENOUS | Status: DC | PRN

## 2022-10-16 MED ORDER — ACETAMINOPHEN 325 MG PO TABS
650.0000 mg | ORAL_TABLET | Freq: Once | ORAL | Status: AC
  Administered 2022-10-16: 650 mg via ORAL
  Filled 2022-10-16: qty 2

## 2022-10-16 MED ORDER — SODIUM CHLORIDE 0.9% FLUSH: 3.0000 mL | Freq: Once | INTRAVENOUS | Status: DC | PRN

## 2022-10-16 MED ORDER — ALTEPLASE 2 MG IJ SOLR: 2.0000 mg | Freq: Once | INTRAMUSCULAR | Status: DC | PRN

## 2022-10-16 MED ORDER — IMMUNE GLOBULIN (HUMAN) 10 GM/100ML IV SOLN
40.0000 g | Freq: Once | INTRAVENOUS | Status: AC
  Administered 2022-10-16: 40 g via INTRAVENOUS
  Filled 2022-10-16: qty 400

## 2022-10-16 MED ORDER — DEXTROSE 5 % IV SOLN: INTRAVENOUS | Status: DC

## 2022-10-16 MED ORDER — SODIUM CHLORIDE 0.9 % IV SOLN: INTRAVENOUS | Status: DC

## 2022-10-16 MED ORDER — DIPHENHYDRAMINE HCL 25 MG PO CAPS
25.0000 mg | ORAL_CAPSULE | Freq: Once | ORAL | Status: AC
  Administered 2022-10-16: 25 mg via ORAL
  Filled 2022-10-16: qty 1

## 2022-10-16 MED ORDER — HEPARIN SOD (PORK) LOCK FLUSH 100 UNIT/ML IV SOLN
500.0000 [IU] | Freq: Once | INTRAVENOUS | Status: AC | PRN
  Administered 2022-10-16: 500 [IU]

## 2022-10-16 MED ORDER — SODIUM CHLORIDE 0.9% FLUSH
10.0000 mL | INTRAVENOUS | Status: DC | PRN
  Administered 2022-10-16: 10 mL

## 2022-10-16 MED ORDER — SODIUM CHLORIDE 0.9 % IV SOLN
510.0000 mg | Freq: Once | INTRAVENOUS | Status: AC
  Administered 2022-10-16: 510 mg via INTRAVENOUS
  Filled 2022-10-16: qty 17

## 2022-10-16 MED ORDER — AZITHROMYCIN 500 MG PO TABS: ORAL_TABLET | ORAL | 0 refills | Status: DC

## 2022-10-16 NOTE — Patient Instructions (Signed)
Immune Globulin Injection What is this medication? IMMUNE GLOBULIN (im MUNE  GLOB yoo lin) helps to prevent or reduce the severity of certain infections in patients who are at risk. This medicine is collected from the pooled blood of many donors. It is used to treat immune system problems, thrombocytopenia, and Kawasaki syndrome. This medicine may be used for other purposes; ask your health care provider or pharmacist if you have questions. This medicine may be used for other purposes; ask your health care provider or pharmacist if you have questions. COMMON BRAND NAME(S): ASCENIV, Baygam, BIVIGAM, Carimune, Carimune NF, cutaquig, Cuvitru, Flebogamma, Flebogamma DIF, GamaSTAN, GamaSTAN S/D, Gamimune N, Gammagard, Gammagard S/D, Gammaked, Gammaplex, Gammar-P IV, Gamunex, Gamunex-C, Hizentra, Iveegam, Iveegam EN, Octagam, Panglobulin, Panglobulin NF, panzyga, Polygam S/D, Privigen, Sandoglobulin, Venoglobulin-S, Vigam, Vivaglobulin, Xembify What should I tell my care team before I take this medication? They need to know if you have any of these conditions: diabetes extremely low or no immune antibodies in the blood heart disease history of blood clots hyperprolinemia infection in the blood, sepsis kidney disease recently received or scheduled to receive a vaccination an unusual or allergic reaction to human immune globulin, albumin, maltose, sucrose, other medicines, foods, dyes, or preservatives pregnant or trying to get pregnant breast-feeding How should I use this medication? This medicine is for injection into a muscle or infusion into a vein or skin. It is usually given by a health care professional in a hospital or clinic setting. In rare cases, some brands of this medicine might be given at home. You will be taught how to give this medicine. Use exactly as directed. Take your medicine at regular intervals. Do not take your medicine more often than directed. Talk to your pediatrician  regarding the use of this medicine in children. While this drug may be prescribed for selected conditions, precautions do apply. Overdosage: If you think you have taken too much of this medicine contact a poison control center or emergency room at once. NOTE: This medicine is only for you. Do not share this medicine with others. Overdosage: If you think you have taken too much of this medicine contact a poison control center or emergency room at once. NOTE: This medicine is only for you. Do not share this medicine with others. What if I miss a dose? It is important not to miss your dose. Call your doctor or health care professional if you are unable to keep an appointment. If you give yourself the medicine and you miss a dose, take it as soon as you can. If it is almost time for your next dose, take only that dose. Do not take double or extra doses. What may interact with this medication? aspirin and aspirin-like medicines cisplatin cyclosporine medicines for infection like acyclovir, adefovir, amphotericin B, bacitracin, cidofovir, foscarnet, ganciclovir, gentamicin, pentamidine, vancomycin NSAIDS, medicines for pain and inflammation, like ibuprofen or naproxen pamidronate vaccines zoledronic acid This list may not describe all possible interactions. Give your health care provider a list of all the medicines, herbs, non-prescription drugs, or dietary supplements you use. Also tell them if you smoke, drink alcohol, or use illegal drugs. Some items may interact with your medicine. This list may not describe all possible interactions. Give your health care provider a list of all the medicines, herbs, non-prescription drugs, or dietary supplements you use. Also tell them if you smoke, drink alcohol, or use illegal drugs. Some items may interact with your medicine. What should I watch for while using this medication?  Your condition will be monitored carefully while you are receiving this  medicine. This medicine is made from pooled blood donations of many different people. It may be possible to pass an infection in this medicine. However, the donors are screened for infections and all products are tested for HIV and hepatitis. The medicine is treated to kill most or all bacteria and viruses. Talk to your doctor about the risks and benefits of this medicine. Do not have vaccinations for at least 14 days before, or until at least 3 months after receiving this medicine. What side effects may I notice from receiving this medication? Side effects that you should report to your doctor or health care professional as soon as possible: allergic reactions like skin rash, itching or hives, swelling of the face, lips, or tongue blue colored lips or skin breathing problems chest pain or tightness fever signs and symptoms of aseptic meningitis such as stiff neck; sensitivity to light; headache; drowsiness; fever; nausea; vomiting; rash signs and symptoms of a blood clot such as chest pain; shortness of breath; pain, swelling, or warmth in the leg signs and symptoms of hemolytic anemia such as fast heartbeat; tiredness; dark yellow or brown urine; or yellowing of the eyes or skin signs and symptoms of kidney injury like trouble passing urine or change in the amount of urine sudden weight gain swelling of the ankles, feet, hands Side effects that usually do not require medical attention (report to your doctor or health care professional if they continue or are bothersome): diarrhea flushing headache increased sweating joint pain muscle cramps muscle pain nausea pain, redness, or irritation at site where injected tiredness This list may not describe all possible side effects. Call your doctor for medical advice about side effects. You may report side effects to FDA at 1-800-FDA-1088. This list may not describe all possible side effects. Call your doctor for medical advice about side  effects. You may report side effects to FDA at 1-800-FDA-1088. Where should I keep my medication? Keep out of the reach of children. This drug is usually given in a hospital or clinic and will not be stored at home. In rare cases, some brands of this medicine may be given at home. If you are using this medicine at home, you will be instructed on how to store this medicine. Throw away any unused medicine after the expiration date on the label. NOTE: This sheet is a summary. It may not cover all possible information. If you have questions about this medicine, talk to your doctor, pharmacist, or health care provider.  2024 Elsevier/Gold Standard (2018-08-21 00:00:00)  Iron Sucrose Injection What is this medication? IRON SUCROSE (EYE ern SOO krose) treats low levels of iron (iron deficiency anemia) in people with kidney disease. Iron is a mineral that plays an important role in making red blood cells, which carry oxygen from your lungs to the rest of your body. This medicine may be used for other purposes; ask your health care provider or pharmacist if you have questions. COMMON BRAND NAME(S): Venofer What should I tell my care team before I take this medication? They need to know if you have any of these conditions: Anemia not caused by low iron levels Heart disease High levels of iron in the blood Kidney disease Liver disease An unusual or allergic reaction to iron, other medications, foods, dyes, or preservatives Pregnant or trying to get pregnant Breastfeeding How should I use this medication? This medication is for infusion into a vein. It  is given in a hospital or clinic setting. Talk to your care team about the use of this medication in children. While this medication may be prescribed for children as young as 2 years for selected conditions, precautions do apply. Overdosage: If you think you have taken too much of this medicine contact a poison control center or emergency room at  once. NOTE: This medicine is only for you. Do not share this medicine with others. What if I miss a dose? Keep appointments for follow-up doses. It is important not to miss your dose. Call your care team if you are unable to keep an appointment. What may interact with this medication? Do not take this medication with any of the following: Deferoxamine Dimercaprol Other iron products This medication may also interact with the following: Chloramphenicol Deferasirox This list may not describe all possible interactions. Give your health care provider a list of all the medicines, herbs, non-prescription drugs, or dietary supplements you use. Also tell them if you smoke, drink alcohol, or use illegal drugs. Some items may interact with your medicine. What should I watch for while using this medication? Visit your care team regularly. Tell your care team if your symptoms do not start to get better or if they get worse. You may need blood work done while you are taking this medication. You may need to follow a special diet. Talk to your care team. Foods that contain iron include: whole grains/cereals, dried fruits, beans, or peas, leafy green vegetables, and organ meats (liver, kidney). What side effects may I notice from receiving this medication? Side effects that you should report to your care team as soon as possible: Allergic reactions--skin rash, itching, hives, swelling of the face, lips, tongue, or throat Low blood pressure--dizziness, feeling faint or lightheaded, blurry vision Shortness of breath Side effects that usually do not require medical attention (report to your care team if they continue or are bothersome): Flushing Headache Joint pain Muscle pain Nausea Pain, redness, or irritation at injection site This list may not describe all possible side effects. Call your doctor for medical advice about side effects. You may report side effects to FDA at 1-800-FDA-1088. Where should I  keep my medication? This medication is given in a hospital or clinic. It will not be stored at home. NOTE: This sheet is a summary. It may not cover all possible information. If you have questions about this medicine, talk to your doctor, pharmacist, or health care provider.  2024 Elsevier/Gold Standard (2022-06-23 00:00:00)

## 2022-10-16 NOTE — Progress Notes (Signed)
Patient does not want to stay for the 30 minute post IVIG observation. VSS. Patient discharged ambulatory without complaints or concerns.

## 2022-10-25 ENCOUNTER — Other Ambulatory Visit: Payer: Medicare Other | Admitting: Pharmacist

## 2022-10-25 NOTE — Progress Notes (Signed)
Care Coordination Call: Attempted to contact patient for scheduled appointment for medication management. Patient requested to reschedule appointment as she her mom is not doing well and she needed to provide her assistance. She has an appointment scheduled with Dr. Allyne Gee next week on 10/1 so will follow up with patient about 4 weeks after that on 10/30.  Jarrett Ables, PharmD PGY-1 Pharmacy Resident

## 2022-10-31 ENCOUNTER — Ambulatory Visit: Payer: Self-pay | Admitting: Internal Medicine

## 2022-10-31 NOTE — Progress Notes (Deleted)
I,Gwendolynn Merkey T Deloria Lair, CMA,acting as a Neurosurgeon for Gwynneth Aliment, MD.,have documented all relevant documentation on the behalf of Gwynneth Aliment, MD,as directed by  Gwynneth Aliment, MD while in the presence of Gwynneth Aliment, MD.  Subjective:  Patient ID: Emily Greer , female    DOB: 08-Jan-1951 , 72 y.o.   MRN: [id number]  No chief complaint on file.   HPI  She presents today for BP f/u. She reports compliance with meds. Denies headache, chest pain, and SOB. Denies headaches, chest pain and shortness of breath. She states driving on [address] was stressful.     Hypertension This is a chronic problem. The current episode started more than 1 year ago. The problem has been gradually improving since onset. The problem is controlled. Pertinent negatives include no blurred vision. Past treatments include beta blockers and diuretics. The current treatment provides moderate improvement. Compliance problems include exercise.  There is no history of pheochromocytoma or renovascular disease.    Past Medical History:  Diagnosis Date  . Avascular necrosis of femoral head (HCC)    bilateral per CT 07-26-2015  . Endometrial carcinoma [facility]) gyn oncologist-  dr Nelly Rout ([facility])/  radiation oncologist-- dr Roselind Messier   2013 dx  FIGO Stage 1A, Grade 2 endometrioid endometrial cancer s/p TAH w/ BSO and bilateral pelvic node dissection 10-31-2011 ;  recurrence at distal vagina 04/ 2014 s/p  brachytherapy (ended 07-29-2012);  2nd recurrence 12/ 2016  vaginal apex s/p  conformational radiotherapy 03-10-2015 to 04-20-2015  . Family history of adverse reaction to anesthesia    mother ponv  . GERD (gastroesophageal reflux disease)   . Goals of care, counseling/discussion 08/21/2019  . H/O stem cell transplant (HCC)    02/ 2000 and second one 06/ 2016  . 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 History of chemotherapy    last chemo 12-26-17  . History of  radiation therapy 6/4, 6/11, 6/19, 6/25, 07/29/2012   vagina 30.5 gray in 5 fx, HDR brachytherapy:   last radiation to vagina 03-10-2015 to 04-20-2015  50.4gray  . History of radiation therapy 03/10/15-04/20/15   vagina 50.4 in 28 fractions  . Hypertension   . Lambda light chain myeloma [facility]) oncologist-  dr Myna Hidalgo ([facility])  and  [facility] -- dr Silvestre Mesi gasparetto   dx 07/ 1999 s/p  VAD chemotherapy 11/ 1999,  purged autotransplant 03-21-1998 followed by auto stem cell transplant 03-29-1998;  recurrance w/ second autologous stem cell transplant 07-24-2014;  in Re-mission currently , chemo maintenance therapy  . Osteoporosis 12/18/05   Increased  risk   . PONV (postoperative nausea and vomiting)   . Wears glasses      Family History  Problem Relation Age of Onset  . Colon cancer Mother   . Hypertension Father   . Heart Problems Father      Current Outpatient Medications:  .  acyclovir (ZOVIRAX) 400 MG tablet, Take 1 tablet (400 mg total) by mouth 2 (two) times daily., Disp: 60 tablet, Rfl: 6 .  albuterol (VENTOLIN HFA) 108 (90 Base) MCG/ACT inhaler, Inhale 2 puffs into the lungs every 6 (six) hours as needed for wheezing or shortness of breath. 2 puffs 3 times daily x 5 days then every 6 hours as needed. (Patient not taking: Reported on 02/23/2022), Disp: 18 g, Rfl: 11 .  amLODipine (NORVASC) 10 MG tablet, TAKE 1 TABLET(10 MG) BY MOUTH EVERY MORNING, Disp: 90 tablet,  Rfl: 1 .  amoxicillin-clavulanate (AUGMENTIN) 875-125 MG tablet, Take 1 tablet by mouth 2 (two) times daily., Disp: , Rfl:  .  aspirin EC 81 MG tablet, Take 81 mg by mouth daily., Disp: , Rfl:  .  azithromycin (ZITHROMAX) 500 MG tablet, Take 2 tablets by mouth today then one tablet by mouth for 4 days, Disp: 6 tablet, Rfl: 0 .  calcium-vitamin D (OSCAL WITH D) 500-5 MG-MCG tablet, Take 1 tablet by mouth daily., Disp: , Rfl:  .  cetirizine (ZYRTEC) 10 MG tablet, Take 1 tablet (10 mg total) by mouth at bedtime., Disp: 90  tablet, Rfl: 1 .  chlorhexidine (PERIDEX) 0.12 % solution, 10 mLs 2 (two) times daily., Disp: , Rfl:  .  Cholecalciferol (VITAMIN D3) 2000 UNITS TABS, Take 2 tablets by mouth daily., Disp: , Rfl:  .  letrozole (FEMARA) 2.5 MG tablet, Take 1 tablet (2.5 mg total) by mouth daily. (Patient not taking: Reported on 08/30/2022), Disp: 30 tablet, Rfl: 12 .  metoprolol tartrate (LOPRESSOR) 50 MG tablet, Take 1/2 tablet by mouth twice daily, Disp: 90 tablet, Rfl: 1 .  penicillin v potassium (VEETID) 500 MG tablet, Take 500 mg by mouth 4 (four) times daily. Prophylaxis dental procedure (Patient not taking: Reported on 01/19/2022), Disp: , Rfl:  .  potassium chloride SA (KLOR-CON M) 20 MEQ tablet, Take 1 tablet (20 mEq total) by mouth 2 (two) times daily., Disp: 60 tablet, Rfl: 3 .  triamcinolone (NASACORT) 55 MCG/ACT AERO nasal inhaler, Place 2 sprays into the nose as needed. (Patient not taking: Reported on 10/11/2022), Disp: , Rfl:  .  Zinc Gluconate 50 MG CAPS, Take 1 tablet by mouth daily., Disp: , Rfl:  No current facility-administered medications for this visit.  Facility-Administered Medications Ordered in Other Visits:  .  0.9 %  sodium chloride infusion, , Intravenous, Once PRN, Ennever, Rose Phi, MD .  albuterol (PROVENTIL) (2.5 MG/3ML) 0.083% nebulizer solution 2.5 mg, 2.5 mg, Nebulization, Once PRN, Josph Macho, MD .  sodium chloride flush (NS) 0.9 % injection 10 mL, 10 mL, Intravenous, PRN, Erenest Blank, NP, 10 mL at 12/26/17 1130 .  sodium chloride flush (NS) 0.9 % injection 10 mL, 10 mL, Intravenous, PRN, Josph Macho, MD, 10 mL at 04/24/19 1305 .  sodium chloride flush (NS) 0.9 % injection 10 mL, 10 mL, Intravenous, PRN, Josph Macho, MD, 10 mL at 08/21/19 1233 .  sodium chloride flush (NS) 0.9 % injection 10 mL, 10 mL, Intracatheter, PRN, Erenest Blank, NP, 10 mL at 10/01/19 1053 .  sodium chloride flush (NS) 0.9 % injection 10 mL, 10 mL, Intracatheter, PRN, Erenest Blank,  NP, 10 mL at 12/11/19 1414 .  sodium chloride flush (NS) 0.9 % injection 10 mL, 10 mL, Intravenous, PRN, Erenest Blank, NP, 10 mL at 07/06/21 1105 .  sodium chloride flush (NS) 0.9 % injection 10 mL, 10 mL, Intracatheter, PRN, Erenest Blank, NP, 10 mL at 07/14/21 0931   Allergies  Allergen Reactions  . Codeine Nausea Only     Review of Systems  Constitutional: Negative.   Eyes:  Negative for blurred vision.  Respiratory: Negative.    Cardiovascular: Negative.   Neurological: Negative.   Psychiatric/Behavioral: Negative.      There were no vitals filed for this visit. There is no height or weight on file to calculate BMI.  Wt Readings from Last 3 Encounters:  10/11/22 180 lb 1.3 oz (81.7 kg)  09/27/22 175 lb  9.6 oz (79.7 kg)  09/05/22 175 lb (79.4 kg)     Objective:  Physical Exam      Assessment And Plan:  Essential (primary) hypertension  Multiple myeloma not having achieved remission (HCC)  Chemotherapy-induced neutropenia (HCC)  Stem cells transplant status (HCC)     No follow-ups on file.  Patient was given opportunity to ask questions. Patient verbalized understanding of the plan and was able to repeat key elements of the plan. All questions were answered to their satisfaction.  Gwynneth Aliment, MD  I, Gwynneth Aliment, MD, have reviewed all documentation for this visit. The documentation on 10/31/22 for the exam, diagnosis, procedures, and orders are all accurate and complete.   IF YOU HAVE BEEN REFERRED TO A SPECIALIST, IT MAY TAKE 1-2 WEEKS TO SCHEDULE/PROCESS THE REFERRAL. IF YOU HAVE NOT HEARD FROM US/SPECIALIST IN TWO WEEKS, PLEASE GIVE [place] A CALL AT [phone] X [phone].   THE PATIENT IS ENCOURAGED TO PRACTICE SOCIAL DISTANCING DUE TO THE COVID-19 PANDEMIC.

## 2022-11-01 ENCOUNTER — Encounter: Payer: Self-pay | Admitting: Internal Medicine

## 2022-11-01 ENCOUNTER — Ambulatory Visit: Payer: Medicare Other | Admitting: Internal Medicine

## 2022-11-01 ENCOUNTER — Ambulatory Visit: Payer: Medicare Other

## 2022-11-01 VITALS — BP 128/80 | HR 86 | Temp 98.9°F | Ht 64.0 in | Wt 176.8 lb

## 2022-11-01 VITALS — BP 128/80 | HR 98 | Temp 98.5°F | Ht 64.0 in | Wt 176.0 lb

## 2022-11-01 DIAGNOSIS — Z1231 Encounter for screening mammogram for malignant neoplasm of breast: Secondary | ICD-10-CM

## 2022-11-01 DIAGNOSIS — C9000 Multiple myeloma not having achieved remission: Secondary | ICD-10-CM

## 2022-11-01 DIAGNOSIS — E66811 Obesity, class 1: Secondary | ICD-10-CM

## 2022-11-01 DIAGNOSIS — Z79899 Other long term (current) drug therapy: Secondary | ICD-10-CM

## 2022-11-01 DIAGNOSIS — Z9484 Stem cells transplant status: Secondary | ICD-10-CM | POA: Diagnosis not present

## 2022-11-01 DIAGNOSIS — Z0000 Encounter for general adult medical examination without abnormal findings: Secondary | ICD-10-CM

## 2022-11-01 DIAGNOSIS — I10 Essential (primary) hypertension: Secondary | ICD-10-CM | POA: Diagnosis not present

## 2022-11-01 DIAGNOSIS — D701 Agranulocytosis secondary to cancer chemotherapy: Secondary | ICD-10-CM

## 2022-11-01 DIAGNOSIS — R296 Repeated falls: Secondary | ICD-10-CM

## 2022-11-01 DIAGNOSIS — R2689 Other abnormalities of gait and mobility: Secondary | ICD-10-CM | POA: Diagnosis not present

## 2022-11-01 DIAGNOSIS — M879 Osteonecrosis, unspecified: Secondary | ICD-10-CM

## 2022-11-01 MED ORDER — CYANOCOBALAMIN 1000 MCG/ML IJ SOLN
1000.0000 ug | Freq: Once | INTRAMUSCULAR | Status: AC
Start: 2022-11-01 — End: 2022-11-01
  Administered 2022-11-01: 1000 ug via INTRAMUSCULAR

## 2022-11-01 NOTE — Patient Instructions (Signed)
[name] , Thank you for taking time to come for your Medicare Wellness Visit. I appreciate your ongoing commitment to your health goals. Please review the following plan we discussed and let me know if I can assist you in the future.   Referrals/Orders/Follow-Ups/Clinician Recommendations: none  This is a list of the screening recommended for you and due dates:  Health Maintenance  Topic Date Due   COVID-19 Vaccine (6 - 2023-24 season) 11/17/2022*   Pneumonia Vaccine (3 of 3 - PPSV23 or PCV20) 03/04/2023*   Flu Shot  04/30/2023*   Hepatitis C Screening  08/29/2023*   Mammogram  01/01/2023   Medicare Annual Wellness Visit  11/01/2023   DTaP/Tdap/Td vaccine (4 - Td or Tdap) 10/27/2025   Colon Cancer Screening  08/11/2027   DEXA scan (bone density measurement)  Completed   Zoster (Shingles) Vaccine  Completed   HPV Vaccine  Aged Out  *Topic was postponed. The date shown is not the original due date.    Advanced directives: (Declined) Advance directive discussed with you today. Even though you declined this today, please call our office should you change your mind, and we can give you the proper paperwork for you to fill out.  Next Medicare Annual Wellness Visit scheduled for next year: No, office will schedule appointment  Insert Preventive Care attachment Insert FALL PREVENTION attachment if needed

## 2022-11-01 NOTE — Progress Notes (Signed)
Subjective:   Emily Greer is a 72 y.o. female who presents for Medicare Annual (Subsequent) preventive examination.  Visit Complete: In person    Cardiac Risk Factors include: advanced age (>84men, >33 women);hypertension     Objective:    Today's Vitals   11/01/22 1407 11/01/22 1427  BP: (!) 142/82 128/80  Pulse: 86   Temp: 98.9 F (37.2 C)   TempSrc: Oral   SpO2: 96%   Weight: 176 lb 12.8 oz (80.2 kg)   Height: 5\' 4"  (1.626 m)    Body mass index is 30.35 kg/m.     11/01/2022    2:14 PM 10/11/2022    9:49 AM 08/30/2022    9:24 AM 07/20/2022    9:20 AM 06/08/2022    8:32 AM 05/11/2022    8:37 AM 04/06/2022    8:38 AM  Advanced Directives  Does Patient Have a Medical Advance Directive? No No No No No No No  Does patient want to make changes to medical advance directive?   No - Patient declined No - Patient declined No - Patient declined No - Patient declined No - Patient declined  Would patient like information on creating a medical advance directive? No - Patient declined No - Patient declined No - Patient declined        Current Medications (verified) Outpatient Encounter Medications as of 11/01/2022  Medication Sig   acyclovir (ZOVIRAX) 400 MG tablet Take 1 tablet (400 mg total) by mouth 2 (two) times daily.   amLODipine (NORVASC) 10 MG tablet TAKE 1 TABLET(10 MG) BY MOUTH EVERY MORNING   aspirin EC 81 MG tablet Take 81 mg by mouth daily.   calcium-vitamin D (OSCAL WITH D) 500-5 MG-MCG tablet Take 1 tablet by mouth daily.   cetirizine (ZYRTEC) 10 MG tablet Take 1 tablet (10 mg total) by mouth at bedtime.   chlorhexidine (PERIDEX) 0.12 % solution 10 mLs 2 (two) times daily.   Cholecalciferol (VITAMIN D3) 2000 UNITS TABS Take 2 tablets by mouth daily.   metoprolol tartrate (LOPRESSOR) 50 MG tablet Take 1/2 tablet by mouth twice daily   potassium chloride SA (KLOR-CON M) 20 MEQ tablet Take 1 tablet (20 mEq total) by mouth 2 (two) times daily.   Zinc Gluconate 50 MG  CAPS Take 1 tablet by mouth daily.   albuterol (VENTOLIN HFA) 108 (90 Base) MCG/ACT inhaler Inhale 2 puffs into the lungs every 6 (six) hours as needed for wheezing or shortness of breath. 2 puffs 3 times daily x 5 days then every 6 hours as needed. (Patient not taking: Reported on 02/23/2022)   amoxicillin-clavulanate (AUGMENTIN) 875-125 MG tablet Take 1 tablet by mouth 2 (two) times daily. (Patient not taking: Reported on 11/01/2022)   azithromycin (ZITHROMAX) 500 MG tablet Take 2 tablets by mouth today then one tablet by mouth for 4 days (Patient not taking: Reported on 11/01/2022)   letrozole (FEMARA) 2.5 MG tablet Take 1 tablet (2.5 mg total) by mouth daily. (Patient not taking: Reported on 08/30/2022)   penicillin v potassium (VEETID) 500 MG tablet Take 500 mg by mouth 4 (four) times daily. Prophylaxis dental procedure (Patient not taking: Reported on 01/19/2022)   triamcinolone (NASACORT) 55 MCG/ACT AERO nasal inhaler Place 2 sprays into the nose as needed. (Patient not taking: Reported on 10/11/2022)   [DISCONTINUED] prochlorperazine (COMPAZINE) 10 MG tablet Take 1 tablet (10 mg total) by mouth every 6 (six) hours as needed (Nausea or vomiting).   Facility-Administered Encounter Medications as of 11/01/2022  Medication   0.9 %  sodium chloride infusion   albuterol (PROVENTIL) (2.5 MG/3ML) 0.083% nebulizer solution 2.5 mg   sodium chloride flush (NS) 0.9 % injection 10 mL   sodium chloride flush (NS) 0.9 % injection 10 mL   sodium chloride flush (NS) 0.9 % injection 10 mL   sodium chloride flush (NS) 0.9 % injection 10 mL   sodium chloride flush (NS) 0.9 % injection 10 mL   sodium chloride flush (NS) 0.9 % injection 10 mL   sodium chloride flush (NS) 0.9 % injection 10 mL    Allergies (verified) Codeine   History: Past Medical History:  Diagnosis Date   Avascular necrosis of femoral head (HCC)    bilateral per CT 07-26-2015   Endometrial carcinoma [facility]) gyn oncologist-  dr Nelly Rout  ([facility])/  radiation oncologist-- dr Roselind Messier   2013 dx  FIGO Stage 1A, Grade 2 endometrioid endometrial cancer s/p TAH w/ BSO and bilateral pelvic node dissection 10-31-2011 ;  recurrence at distal vagina 04/ 2014 s/p  brachytherapy (ended 07-29-2012);  2nd recurrence 12/ 2016  vaginal apex s/p  conformational radiotherapy 03-10-2015 to 04-20-2015   Family history of adverse reaction to anesthesia    mother ponv   GERD (gastroesophageal reflux disease)    Goals of care, counseling/discussion 08/21/2019   H/O stem cell transplant (HCC)    02/ 2000 and second one 06/ 2016   History of bacteremia    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   History of chemotherapy    last chemo 12-26-17   History of radiation therapy 6/4, 6/11, 6/19, 6/25, 07/29/2012   vagina 30.5 gray in 5 fx, HDR brachytherapy:   last radiation to vagina 03-10-2015 to 04-20-2015  50.4gray   History of radiation therapy 03/10/15-04/20/15   vagina 50.4 in 28 fractions   Hypertension    Lambda light chain myeloma [facility]) oncologist-  dr Myna Hidalgo ([facility])  and  [facility] -- dr Silvestre Mesi gasparetto   dx 07/ 1999 s/p  VAD chemotherapy 11/ 1999,  purged autotransplant 03-21-1998 followed by auto stem cell transplant 03-29-1998;  recurrance w/ second autologous stem cell transplant 07-24-2014;  in Re-mission currently , chemo maintenance therapy   Osteoporosis 12/18/05   Increased  risk    PONV (postoperative nausea and vomiting)    Wears glasses    Past Surgical History:  Procedure Laterality Date   ABDOMINAL HYSTERECTOMY  10/31/2011   Procedure: HYSTERECTOMY ABDOMINAL;  Surgeon: Laurette Schimke, MD PHD;  Location: [facility];  Service: Gynecology;  Laterality: N/A;   CO2 LASER APPLICATION N/A 02/22/2016   Procedure: CO2 LASER APPLICATION;  Surgeon: Laurette Schimke, MD;  Location: [facility];  Service: Gynecology;  Laterality: N/A;   CYSTOSCOPY WITH RETROGRADE PYELOGRAM, URETEROSCOPY AND STENT  PLACEMENT Left 02/04/2018   Procedure: CYSTOSCOPY WITH RETROGRADE PYELOGRAM, URETEROSCOPY , bladder biopsy and fulgeration;  Surgeon: Malen Gauze, MD;  Location: [facility];  Service: Urology;  Laterality: Left;   ECTOPIC PREGNANCY SURGERY  1992   HYSTEROSCOPY WITH D & C  09/27/2011   Procedure: DILATATION AND CURETTAGE /HYSTEROSCOPY;  Surgeon: Hal Morales, MD;  Location: [facility];  Service: Gynecology;;   IR FLUORO GUIDE PORT INSERTION RIGHT  09/08/2016   IR US GUIDE VASC ACCESS RIGHT  09/08/2016   LAPAROTOMY  10/31/2011   Procedure: EXPLORATORY LAPAROTOMY;  Surgeon: Laurette Schimke, MD PHD;  Location: [facility];  Service: Gynecology;  Laterality: N/A;   SALPINGOOPHORECTOMY  10/31/2011  Procedure: SALPINGO OOPHERECTOMY;  Surgeon: Laurette Schimke, MD PHD;  Location: [facility];  Service: Gynecology;  Laterality: Bilateral;   Lymph Nodes sampling   TUBAL LIGATION  1986   Family History  Problem Relation Age of Onset   Colon cancer Mother    Hypertension Father    Heart Problems Father    Social History   Socioeconomic History   Marital status: Married    Spouse name: Not on file   Number of children: Not on file   Years of education: Not on file   Highest education level: Not on file  Occupational History   Occupation: retired  Tobacco Use   Smoking status: Never   Smokeless tobacco: Never  Vaping Use   Vaping status: Never Used  Substance and Sexual Activity   Alcohol use: Not Currently    Alcohol/week: 0.0 standard drinks of alcohol   Drug use: Never   Sexual activity: Yes    Birth control/protection: Surgical  Other Topics Concern   Not on file  Social History Narrative   Not on file   Social Determinants of Health   Financial Resource Strain: Low Risk  (11/01/2022)   Overall Financial Resource Strain (CARDIA)    Difficulty of Paying Living Expenses: Not hard at all  Food Insecurity: No Food Insecurity (11/01/2022)   Hunger Vital Sign    Worried About  Running Out of Food in the Last Year: Never true    Ran Out of Food in the Last Year: Never true  Transportation Needs: No Transportation Needs (11/01/2022)   PRAPARE - Administrator, Civil Service (Medical): No    Lack of Transportation (Non-Medical): No  Physical Activity: Inactive (11/01/2022)   Exercise Vital Sign    Days of Exercise per Week: 0 days    Minutes of Exercise per Session: 0 min  Stress: No Stress Concern Present (11/01/2022)   [employer] of Occupational Health - Occupational Stress Questionnaire    Feeling of Stress : Not at all  Social Connections: Socially Integrated (11/01/2022)   Social Connection and Isolation Panel [NHANES]    Frequency of Communication with Friends and Family: More than three times a week    Frequency of Social Gatherings with Friends and Family: Once a week    Attends Religious Services: More than 4 times per year    Active Member of [employer] or Organizations: Yes    Attends Engineer, structural: More than 4 times per year    Marital Status: Married    Tobacco Counseling Counseling given: Not Answered   Clinical Intake:  Pre-visit preparation completed: Yes  Pain : No/denies pain     Nutritional Status: BMI > 30  Obese Nutritional Risks: None Diabetes: No  How often do you need to have someone help you when you read instructions, pamphlets, or other written materials from your doctor or pharmacy?: 1 - Never  Interpreter Needed?: No  Information entered by :: NAllen LPN   Activities of Daily Living    11/01/2022    2:08 PM  In your present state of health, do you have any difficulty performing the following activities:  Hearing? 0  Vision? 0  Difficulty concentrating or making decisions? 0  Walking or climbing stairs? 0  Dressing or bathing? 0  Doing errands, shopping? 0  Preparing Food and eating ? N  Using the Toilet? N  In the past six months, have you accidently leaked urine? N  Do you have  problems  with loss of bowel control? N  Managing your Medications? N  Managing your Finances? N  Housekeeping or managing your Housekeeping? N    Patient Care Team: [name], MD as PCP - General (Internal Medicine) Alden Hipp, RPH-CPP (Pharmacist) [facility]  Indicate any recent Medical Services you may have received from other than [facility] providers in the past year (date may be approximate).     Assessment:   This is a routine wellness examination for Lupita.  Hearing/Vision screen Hearing Screening - Comments:: Denies hearing issues Vision Screening - Comments:: Regular eye exams, [facility]   Goals Addressed             This Visit's Progress    Patient Stated       11/01/2022, wants to start exercising       Depression Screen    11/01/2022    2:16 PM 07/04/2022   11:46 AM 03/03/2022   12:44 PM 02/27/2022   12:09 PM 10/12/2021    3:52 PM 09/29/2020    2:07 PM 09/24/2019   12:04 PM  PHQ 2/9 Scores  PHQ - 2 Score 0 0 0 0 0 0 0  PHQ- 9 Score 0 0     0    Fall Risk    11/01/2022    2:14 PM 07/04/2022   11:46 AM 03/03/2022   12:44 PM 02/27/2022   12:09 PM 10/12/2021    3:52 PM  Fall Risk   Falls in the past year? 1 0 0 0 1  Comment tripped over dishwasher door, tripped over suitcase    tripped on elevator  Number falls in past yr: 1 0 0 0 0  Injury with Fall? 0 0 0 0 0  Risk for fall due to : Medication side effect;Impaired balance/gait No Fall Risks No Fall Risks No Fall Risks Medication side effect  Follow up Falls prevention discussed;Falls evaluation completed Falls evaluation completed Falls evaluation completed Falls evaluation completed Falls evaluation completed;Education provided;Falls prevention discussed    MEDICARE RISK AT HOME: Medicare Risk at Home Any stairs in or around the home?: Yes If so, are there any without handrails?: No Home free of loose throw rugs in walkways, pet beds, electrical cords, etc?:  Yes Adequate lighting in your home to reduce risk of falls?: Yes Life alert?: No Use of a cane, walker or w/c?: No Grab bars in the bathroom?: Yes Shower chair or bench in shower?: Yes Elevated toilet seat or a handicapped toilet?: Yes  TIMED UP AND GO:  Was the test performed?  Yes  Length of time to ambulate 10 feet: 6 sec Gait slow and steady without use of assistive device    Cognitive Function:        11/01/2022    2:17 PM 10/12/2021    3:56 PM 09/29/2020    2:09 PM 09/24/2019   12:07 PM 09/18/2018   12:05 PM  6CIT Screen  What Year? 0 points 0 points 0 points 0 points 0 points  What month? 0 points 0 points 0 points 0 points 0 points  What time? 0 points 0 points 0 points 0 points 0 points  Count back from 20 0 points 0 points 0 points 0 points 0 points  Months in reverse 0 points 0 points 0 points 0 points 0 points  Repeat phrase 0 points 4 points 0 points 4 points 0 points  Total Score 0 points 4 points 0 points 4 points 0 points  Immunizations Immunization History  Administered Date(s) Administered   DTaP 08/25/2015, 10/28/2015   Fluad Quad(high Dose 65+) 11/19/2020, 10/12/2021   HIB (PRP-T) 08/25/2015, 10/28/2015   Hepatitis B, ADULT 08/25/2015, 10/28/2015   IPV 08/25/2015, 10/28/2015   Influenza Split 11/01/2011   Influenza, High Dose Seasonal PF 11/12/2018   Influenza,inj,Quad PF,6+ Mos 11/03/2015, 10/31/2017   Influenza-Unspecified 10/30/2013, 10/28/2014   PFIZER Comirnaty(Gray Top)Covid-19 Tri-Sucrose Vaccine 11/19/2020   PFIZER(Purple Top)SARS-COV-2 Vaccination 02/19/2019, 03/12/2019, 11/11/2019, 06/09/2020   Pneumococcal Conjugate-13 02/12/2015, 08/25/2015, 10/28/2015   Pneumococcal Polysaccharide-23 11/05/2013   Tdap 08/27/2015   Zoster Recombinant(Shingrix) 11/30/2021, 04/26/2022    TDAP status: Up to date  Flu Vaccine status: Due, Education has been provided regarding the importance of this vaccine. Advised may receive this vaccine at local  pharmacy or Health Dept. Aware to provide a copy of the vaccination record if obtained from local pharmacy or Health Dept. Verbalized acceptance and understanding.  Pneumococcal vaccine status: Up to date  Covid-19 vaccine status: Information provided on how to obtain vaccines.   Qualifies for Shingles Vaccine? Yes   Zostavax completed No   Shingrix Completed?: No.    Education has been provided regarding the importance of this vaccine. Patient has been advised to call insurance company to determine out of pocket expense if they have not yet received this vaccine. Advised may also receive vaccine at local pharmacy or Health Dept. Verbalized acceptance and understanding.  Screening Tests Health Maintenance  Topic Date Due   COVID-19 Vaccine (6 - 2023-24 season) 11/17/2022 (Originally 10/01/2022)   Pneumonia Vaccine 39+ Years old (3 of 3 - PPSV23 or PCV20) 03/04/2023 (Originally 11/06/2018)   INFLUENZA VACCINE  04/30/2023 (Originally 08/31/2022)   Hepatitis C Screening  08/29/2023 (Originally 05/19/1968)   MAMMOGRAM  01/01/2023   Medicare Annual Wellness (AWV)  11/01/2023   DTaP/Tdap/Td (4 - Td or Tdap) 10/27/2025   Colonoscopy  08/11/2027   DEXA SCAN  Completed   Zoster Vaccines- Shingrix  Completed   HPV VACCINES  Aged Out    Health Maintenance  There are no preventive care reminders to display for this patient.   Colorectal cancer screening: Type of screening: Colonoscopy. Completed 08/10/2017. Repeat every 10 years  Mammogram status: Completed 12/31/2020. Repeat every 2 years  Bone Density status: Completed 04/11/2022.   Lung Cancer Screening: (Low Dose CT Chest recommended if Age 50-80 years, 20 pack-year currently smoking OR have quit w/in 15years.) does not qualify.   Lung Cancer Screening Referral: no  Additional Screening:  Hepatitis C Screening: does not qualify;   Vision Screening: Recommended annual ophthalmology exams for early detection of glaucoma and other disorders  of the eye. Is the patient up to date with their annual eye exam?  Yes  Who is the provider or what is the name of the office in which the patient attends annual eye exams? [facility] If pt is not established with a provider, would they like to be referred to a provider to establish care? No .   Dental Screening: Recommended annual dental exams for proper oral hygiene  Diabetic Foot Exam: n/a  Community Resource Referral / Chronic Care Management: CRR required this visit?  No   CCM required this visit?  No     Plan:     I have personally reviewed and noted the following in the patient's chart:   Medical and social history Use of alcohol, tobacco or illicit drugs  Current medications and supplements including opioid prescriptions. Patient is not currently taking opioid  prescriptions. Functional ability and status Nutritional status Physical activity Advanced directives List of other physicians Hospitalizations, surgeries, and ER visits in previous 12 months Vitals Screenings to include cognitive, depression, and falls Referrals and appointments  In addition, I have reviewed and discussed with patient certain preventive protocols, quality metrics, and best practice recommendations. A written personalized care plan for preventive services as well as general preventive health recommendations were provided to patient.     Barb Merino, LPN   10/07/1189   After Visit Summary: (In Person-Printed) AVS printed and given to the patient  Nurse Notes: none

## 2022-11-01 NOTE — Patient Instructions (Signed)
Hypertension, Adult Hypertension is another name for high blood pressure. High blood pressure forces your heart to work harder to pump blood. This can cause problems over time. There are two numbers in a blood pressure reading. There is a top number (systolic) over a bottom number (diastolic). It is best to have a blood pressure that is below 120/80. What are the causes? The cause of this condition is not known. Some other conditions can lead to high blood pressure. What increases the risk? Some lifestyle factors can make you more likely to develop high blood pressure: Smoking. Not getting enough exercise or physical activity. Being overweight. Having too much fat, sugar, calories, or salt (sodium) in your diet. Drinking too much alcohol. Other risk factors include: Having any of these conditions: Heart disease. Diabetes. High cholesterol. Kidney disease. Obstructive sleep apnea. Having a family history of high blood pressure and high cholesterol. Age. The risk increases with age. Stress. What are the signs or symptoms? High blood pressure may not cause symptoms. Very high blood pressure (hypertensive crisis) may cause: Headache. Fast or uneven heartbeats (palpitations). Shortness of breath. Nosebleed. Vomiting or feeling like you may vomit (nauseous). Changes in how you see. Very bad chest pain. Feeling dizzy. Seizures. How is this treated? This condition is treated by making healthy lifestyle changes, such as: Eating healthy foods. Exercising more. Drinking less alcohol. Your doctor may prescribe medicine if lifestyle changes do not help enough and if: Your top number is above 130. Your bottom number is above 80. Your personal target blood pressure may vary. Follow these instructions at home: Eating and drinking  If told, follow the DASH eating plan. To follow this plan: Fill one half of your plate at each meal with fruits and vegetables. Fill one fourth of your plate  at each meal with whole grains. Whole grains include whole-wheat pasta, brown rice, and whole-grain bread. Eat or drink low-fat dairy products, such as skim milk or low-fat yogurt. Fill one fourth of your plate at each meal with low-fat (lean) proteins. Low-fat proteins include fish, chicken without skin, eggs, beans, and tofu. Avoid fatty meat, cured and processed meat, or chicken with skin. Avoid pre-made or processed food. Limit the amount of salt in your diet to less than 1,500 mg each day. Do not drink alcohol if: Your doctor tells you not to drink. You are pregnant, may be pregnant, or are planning to become pregnant. If you drink alcohol: Limit how much you have to: 0-1 drink a day for women. 0-2 drinks a day for men. Know how much alcohol is in your drink. In the [place] one drink equals one 12 oz bottle of beer (355 mL), one 5 oz glass of wine (148 mL), or one 1 oz glass of hard liquor (44 mL). Lifestyle  Work with your doctor to stay at a healthy weight or to lose weight. Ask your doctor what the best weight is for you. Get at least 30 minutes of exercise that causes your heart to beat faster (aerobic exercise) most days of the week. This may include walking, swimming, or biking. Get at least 30 minutes of exercise that strengthens your muscles (resistance exercise) at least 3 days a week. This may include lifting weights or doing Pilates. Do not smoke or use any products that contain nicotine or tobacco. If you need help quitting, ask your doctor. Check your blood pressure at home as told by your doctor. Keep all follow-up visits. Medicines Take over-the-counter and prescription medicines   only as told by your doctor. Follow directions carefully. Do not skip doses of blood pressure medicine. The medicine does not work as well if you skip doses. Skipping doses also puts you at risk for problems. Ask your doctor about side effects or reactions to medicines that you should watch  for. Contact a doctor if: You think you are having a reaction to the medicine you are taking. You have headaches that keep coming back. You feel dizzy. You have swelling in your ankles. You have trouble with your vision. Get help right away if: You get a very bad headache. You start to feel mixed up (confused). You feel weak or numb. You feel faint. You have very bad pain in your: Chest. Belly (abdomen). You vomit more than once. You have trouble breathing. These symptoms may be an emergency. Get help right away. Call 911. Do not wait to see if the symptoms will go away. Do not drive yourself to the hospital. Summary Hypertension is another name for high blood pressure. High blood pressure forces your heart to work harder to pump blood. For most people, a normal blood pressure is less than 120/80. Making healthy choices can help lower blood pressure. If your blood pressure does not get lower with healthy choices, you may need to take medicine. This information is not intended to replace advice given to you by your health care provider. Make sure you discuss any questions you have with your health care provider. Document Revised: 11/04/2020 Document Reviewed: 11/04/2020 Elsevier Patient Education  2024 [employer]  

## 2022-11-01 NOTE — Progress Notes (Signed)
I,Victoria T Deloria Lair, CMA,acting as a Neurosurgeon for Gwynneth Aliment, MD.,have documented all relevant documentation on the behalf of Gwynneth Aliment, MD,as directed by  Gwynneth Aliment, MD while in the presence of Gwynneth Aliment, MD.  Subjective:  Patient ID: Emily Greer , female    DOB: Dec 10, 1950 , 72 y.o.   MRN: [id number]  Chief Complaint  Patient presents with   Hypertension    HPI  She presents today for BP f/u. She reports compliance with meds. Denies headache, chest pain, and SOB. She reports compliance with meds.   AWV completed with [facility] Advisor: [place].   Hypertension This is a chronic problem. The current episode started more than 1 year ago. The problem has been gradually improving since onset. The problem is controlled. Pertinent negatives include no blurred vision. Past treatments include beta blockers and diuretics. The current treatment provides moderate improvement. Compliance problems include exercise.  There is no history of pheochromocytoma or renovascular disease.     Past Medical History:  Diagnosis Date   Avascular necrosis of femoral head (HCC)    bilateral per CT 07-26-2015   Endometrial carcinoma [facility]) gyn oncologist-  dr Nelly Rout ([facility])/  radiation oncologist-- dr Roselind Messier   2013 dx  FIGO Stage 1A, Grade 2 endometrioid endometrial cancer s/p TAH w/ BSO and bilateral pelvic node dissection 10-31-2011 ;  recurrence at distal vagina 04/ 2014 s/p  brachytherapy (ended 07-29-2012);  2nd recurrence 12/ 2016  vaginal apex s/p  conformational radiotherapy 03-10-2015 to 04-20-2015   Family history of adverse reaction to anesthesia    mother ponv   GERD (gastroesophageal reflux disease)    Goals of care, counseling/discussion 08/21/2019   H/O stem cell transplant (HCC)    02/ 2000 and second one 06/ 2016   History of bacteremia    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   History of chemotherapy    last chemo 12-26-17   History of  radiation therapy 6/4, 6/11, 6/19, 6/25, 07/29/2012   vagina 30.5 gray in 5 fx, HDR brachytherapy:   last radiation to vagina 03-10-2015 to 04-20-2015  50.4gray   History of radiation therapy 03/10/15-04/20/15   vagina 50.4 in 28 fractions   Hypertension    Lambda light chain myeloma [facility]) oncologist-  dr Myna Hidalgo ([facility])  and  [facility] -- dr Silvestre Mesi gasparetto   dx 07/ 1999 s/p  VAD chemotherapy 11/ 1999,  purged autotransplant 03-21-1998 followed by auto stem cell transplant 03-29-1998;  recurrance w/ second autologous stem cell transplant 07-24-2014;  in Re-mission currently , chemo maintenance therapy   Osteoporosis 12/18/05   Increased  risk    PONV (postoperative nausea and vomiting)    Wears glasses      Family History  Problem Relation Age of Onset   Colon cancer Mother    Hypertension Father    Heart Problems Father      Current Outpatient Medications:    acyclovir (ZOVIRAX) 400 MG tablet, Take 1 tablet (400 mg total) by mouth 2 (two) times daily., Disp: 60 tablet, Rfl: 6   albuterol (VENTOLIN HFA) 108 (90 Base) MCG/ACT inhaler, Inhale 2 puffs into the lungs every 6 (six) hours as needed for wheezing or shortness of breath. 2 puffs 3 times daily x 5 days then every 6 hours as needed. (Patient not taking: Reported on 02/23/2022), Disp: 18 g, Rfl: 11   amLODipine (NORVASC) 10 MG tablet, TAKE 1 TABLET(10 MG) BY MOUTH EVERY MORNING, Disp:  90 tablet, Rfl: 1   aspirin EC 81 MG tablet, Take 81 mg by mouth daily., Disp: , Rfl:    azithromycin (ZITHROMAX) 500 MG tablet, Take 2 tablets by mouth today then one tablet by mouth for 4 days (Patient not taking: Reported on 11/01/2022), Disp: 6 tablet, Rfl: 0   calcium-vitamin D (OSCAL WITH D) 500-5 MG-MCG tablet, Take 1 tablet by mouth daily., Disp: , Rfl:    cetirizine (ZYRTEC) 10 MG tablet, Take 1 tablet (10 mg total) by mouth at bedtime., Disp: 90 tablet, Rfl: 1   chlorhexidine (PERIDEX) 0.12 % solution, 10 mLs 2 (two) times daily.,  Disp: , Rfl:    Cholecalciferol (VITAMIN D3) 2000 UNITS TABS, Take 2 tablets by mouth daily., Disp: , Rfl:    letrozole (FEMARA) 2.5 MG tablet, Take 1 tablet (2.5 mg total) by mouth daily. (Patient not taking: Reported on 08/30/2022), Disp: 30 tablet, Rfl: 12   metoprolol tartrate (LOPRESSOR) 50 MG tablet, Take 1/2 tablet by mouth twice daily, Disp: 90 tablet, Rfl: 1   penicillin v potassium (VEETID) 500 MG tablet, Take 500 mg by mouth 4 (four) times daily. Prophylaxis dental procedure (Patient not taking: Reported on 01/19/2022), Disp: , Rfl:    potassium chloride SA (KLOR-CON M) 20 MEQ tablet, Take 1 tablet (20 mEq total) by mouth 2 (two) times daily., Disp: 60 tablet, Rfl: 3   triamcinolone (NASACORT) 55 MCG/ACT AERO nasal inhaler, Place 2 sprays into the nose as needed. (Patient not taking: Reported on 10/11/2022), Disp: , Rfl:    Zinc Gluconate 50 MG CAPS, Take 1 tablet by mouth daily., Disp: , Rfl:  No current facility-administered medications for this visit.  Facility-Administered Medications Ordered in Other Visits:    0.9 %  sodium chloride infusion, , Intravenous, Once PRN, Ennever, Rose Phi, MD   albuterol (PROVENTIL) (2.5 MG/3ML) 0.083% nebulizer solution 2.5 mg, 2.5 mg, Nebulization, Once PRN, Ennever, Rose Phi, MD   sodium chloride flush (NS) 0.9 % injection 10 mL, 10 mL, Intravenous, PRN, Erenest Blank, NP, 10 mL at 12/26/17 1130   sodium chloride flush (NS) 0.9 % injection 10 mL, 10 mL, Intravenous, PRN, Josph Macho, MD, 10 mL at 04/24/19 1305   sodium chloride flush (NS) 0.9 % injection 10 mL, 10 mL, Intravenous, PRN, Josph Macho, MD, 10 mL at 08/21/19 1233   sodium chloride flush (NS) 0.9 % injection 10 mL, 10 mL, Intracatheter, PRN, Erenest Blank, NP, 10 mL at 10/01/19 1053   sodium chloride flush (NS) 0.9 % injection 10 mL, 10 mL, Intracatheter, PRN, Erenest Blank, NP, 10 mL at 12/11/19 1414   sodium chloride flush (NS) 0.9 % injection 10 mL, 10 mL, Intravenous, PRN,  Erenest Blank, NP, 10 mL at 07/06/21 1105   sodium chloride flush (NS) 0.9 % injection 10 mL, 10 mL, Intracatheter, PRN, Erenest Blank, NP, 10 mL at 07/14/21 0931   Allergies  Allergen Reactions   Codeine Nausea Only     Review of Systems  Constitutional: Negative.   Eyes:  Negative for blurred vision.  Respiratory: Negative.    Cardiovascular: Negative.   Gastrointestinal: Negative.   Neurological: Negative.        States she feels she has balance issues. Does not feel dizzy. Not sure what is contributing to her issues. Has resulted in a fall.   Psychiatric/Behavioral: Negative.       Today's Vitals   11/01/22 1434  BP: 128/80  Pulse: 98  Temp:  98.5 F (36.9 C)  SpO2: 98%  Weight: 176 lb (79.8 kg)  Height: 5\' 4"  (1.626 m)   Body mass index is 30.21 kg/m.  Wt Readings from Last 3 Encounters:  11/01/22 176 lb (79.8 kg)  11/01/22 176 lb 12.8 oz (80.2 kg)  10/11/22 180 lb 1.3 oz (81.7 kg)     BP Readings from Last 3 Encounters:  11/01/22 128/80  11/01/22 128/80  10/16/22 (!) 158/89     Objective:  Physical Exam Vitals and nursing note reviewed.  Constitutional:      Appearance: Normal appearance.  HENT:     Head: Normocephalic and atraumatic.  Eyes:     Extraocular Movements: Extraocular movements intact.  Cardiovascular:     Rate and Rhythm: Normal rate and regular rhythm.     Heart sounds: Normal heart sounds.  Pulmonary:     Effort: Pulmonary effort is normal.     Breath sounds: Normal breath sounds.  Musculoskeletal:     Cervical back: Normal range of motion.  Skin:    General: Skin is warm.  Neurological:     General: No focal deficit present.     Mental Status: She is alert.  Psychiatric:        Mood and Affect: Mood normal.        Behavior: Behavior normal.         Assessment And Plan:  Essential (primary) hypertension Assessment & Plan: Chronic, repeat BP near goal. She will c/w amlodipine 10mg  in AM and metoprolol to 1/2 tablet  twice daily.   Orders: -     Lipid panel  Imbalance Assessment & Plan: I will check vitamin B12 level today. She also agrees to vitamin B12 injection. She is aware that if her levels are low, she will need weekly vitamin B12 injections x4. Risk of irreversible memory loss if untreated, was discussed with the patient.   Orders: -     Cyanocobalamin  Stem cells transplant status (HCC)  Encounter for screening mammogram for malignant neoplasm of breast -     3D Screening Mammogram, Left and Right; Future  Drug therapy -     Vitamin B12  She is encouraged to strive for BMI less than 30 to decrease cardiac risk. Advised to aim for at least 150 minutes of exercise per week.    Return in 4 months (on 03/04/2023), or physical examination, for 1 year ov & thn ov, also BP w/ RS.  Patient was given opportunity to ask questions. Patient verbalized understanding of the plan and was able to repeat key elements of the plan. All questions were answered to their satisfaction.    I, Gwynneth Aliment, MD, have reviewed all documentation for this visit. The documentation on 11/01/22 for the exam, diagnosis, procedures, and orders are all accurate and complete.   IF YOU HAVE BEEN REFERRED TO A SPECIALIST, IT MAY TAKE 1-2 WEEKS TO SCHEDULE/PROCESS THE REFERRAL. IF YOU HAVE NOT HEARD FROM US/SPECIALIST IN TWO WEEKS, PLEASE GIVE [place] A CALL AT [phone].   THE PATIENT IS ENCOURAGED TO PRACTICE SOCIAL DISTANCING DUE TO THE COVID-19 PANDEMIC.

## 2022-11-02 LAB — VITAMIN B12: Vitamin B-12: 170 pg/mL — ABNORMAL LOW (ref 232–1245)

## 2022-11-02 LAB — LIPID PANEL
Chol/HDL Ratio: 2.4 {ratio} (ref 0.0–4.4)
Cholesterol, Total: 177 mg/dL (ref 100–199)
HDL: 74 mg/dL (ref >39)
LDL Chol Calc (NIH): 82 mg/dL (ref 0–99)
Triglycerides: 124 mg/dL (ref 0–149)
VLDL Cholesterol Cal: 21 mg/dL (ref 5–40)

## 2022-11-12 DIAGNOSIS — R2689 Other abnormalities of gait and mobility: Secondary | ICD-10-CM | POA: Insufficient documentation

## 2022-11-12 NOTE — Assessment & Plan Note (Signed)
Chronic, repeat BP near goal. She will c/w amlodipine 10mg  in AM and metoprolol to 1/2 tablet twice daily.

## 2022-11-12 NOTE — Assessment & Plan Note (Signed)
I will check vitamin B12 level today. She also agrees to vitamin B12 injection. She is aware that if her levels are low, she will need weekly vitamin B12 injections x4. Risk of irreversible memory loss if untreated, was discussed with the patient.

## 2022-11-13 ENCOUNTER — Encounter: Payer: Self-pay | Admitting: Hematology & Oncology

## 2022-11-14 ENCOUNTER — Ambulatory Visit (INDEPENDENT_AMBULATORY_CARE_PROVIDER_SITE_OTHER): Payer: Medicare Other

## 2022-11-14 VITALS — BP 128/86 | HR 72 | Temp 98.1°F | Ht 64.0 in | Wt 176.0 lb

## 2022-11-14 DIAGNOSIS — E538 Deficiency of other specified B group vitamins: Secondary | ICD-10-CM | POA: Diagnosis not present

## 2022-11-14 MED ORDER — CYANOCOBALAMIN 1000 MCG/ML IJ SOLN
1000.0000 ug | Freq: Once | INTRAMUSCULAR | Status: AC
Start: 2022-11-14 — End: 2022-11-14
  Administered 2022-11-14: 1000 ug via INTRAMUSCULAR

## 2022-11-14 NOTE — Progress Notes (Signed)
Patient presents today for 1st b12 injection.

## 2022-11-14 NOTE — Patient Instructions (Signed)
Vitamin B12 Deficiency Vitamin B12 deficiency means that your body does not have enough vitamin B12. The body needs this important vitamin: To make red blood cells. To make genes (DNA). To help the nerves work. If you do not have enough vitamin B12 in your body, you can have health problems, such as not having enough red blood cells in the blood (anemia). What are the causes? Not eating enough foods that contain vitamin B12. Not being able to take in (absorb) vitamin B12 from the food that you eat. Certain diseases. A condition in which the body does not make enough of a certain protein. This results in your body not taking in enough vitamin B12. Having a surgery in which part of the stomach or small intestine is taken out. Taking medicines that make it hard for the body to take in vitamin B12. These include: Heartburn medicines. Some medicines that are used to treat diabetes. What increases the risk? Being an older adult. Eating a vegetarian or vegan diet that does not include any foods that come from animals. Not eating enough foods that contain vitamin B12 while you are pregnant. Taking certain medicines. Having alcoholism. What are the signs or symptoms? In some cases, there are no symptoms. If the condition leads to too few blood cells or nerve damage, symptoms can occur, such as: Feeling weak or tired. Not being hungry. Losing feeling (numbness) or tingling in your hands and feet. Redness and burning of the tongue. Feeling sad (depressed). Confusion or memory problems. Trouble walking. If anemia is very bad, symptoms can include: Being short of breath. Being dizzy. Having a very fast heartbeat. How is this treated? Changing the way you eat and drink, such as: Eating more foods that contain vitamin B12. Drinking little or no alcohol. Getting vitamin B12 shots. Taking vitamin B12 supplements by mouth (orally). Your doctor will tell you the dose that is best for you. Follow  these instructions at home: Eating and drinking  Eat foods that come from animals and have a lot of vitamin B12 in them. These include: Meats and poultry. This includes beef, pork, chicken, [place], and organ meats, such as liver. Seafood, such as clams, rainbow trout, salmon, tuna, and haddock. Eggs. Dairy foods such as milk, yogurt, and cheese. Eat breakfast cereals that have vitamin B12 added to them (are fortified). Check the label. The items listed above may not be a complete list of foods and beverages you can eat and drink. Contact a dietitian for more information. Alcohol use Do not drink alcohol if: Your doctor tells you not to drink. You are pregnant, may be pregnant, or are planning to become pregnant. If you drink alcohol: Limit how much you have to: 0-1 drink a day for women. 0-2 drinks a day for men. Know how much alcohol is in your drink. In the [place] one drink equals one 12 oz bottle of beer (355 mL), one 5 oz glass of wine (148 mL), or one 1 oz glass of hard liquor (44 mL). General instructions Get any vitamin B12 shots if told by your doctor. Take supplements only as told by your doctor. Follow the directions. Keep all follow-up visits. Contact a doctor if: Your symptoms come back. Your symptoms get worse or do not get better with treatment. Get help right away if: You have trouble breathing. You have a very fast heartbeat. You have chest pain. You get dizzy. You faint. These symptoms may be an emergency. Get help right away. Call 911.  Do not wait to see if the symptoms will go away. Do not drive yourself to the hospital. Summary Vitamin B12 deficiency means that your body is not getting enough of the vitamin. In some cases, there are no symptoms of this condition. Treatment may include making a change in the way you eat and drink, getting shots, or taking supplements. Eat foods that have vitamin B12 in them. This information is not intended to replace advice  given to you by your health care provider. Make sure you discuss any questions you have with your health care provider. Document Revised: 09/10/2020 Document Reviewed: 09/10/2020 Elsevier Patient Education  2024 [employer].

## 2022-11-21 ENCOUNTER — Ambulatory Visit (INDEPENDENT_AMBULATORY_CARE_PROVIDER_SITE_OTHER): Payer: Medicare Other

## 2022-11-21 VITALS — BP 134/82 | HR 84 | Temp 98.1°F | Ht 64.0 in | Wt 176.0 lb

## 2022-11-21 DIAGNOSIS — E538 Deficiency of other specified B group vitamins: Secondary | ICD-10-CM

## 2022-11-21 MED ORDER — CYANOCOBALAMIN 1000 MCG/ML IJ SOLN
1000.0000 ug | Freq: Once | INTRAMUSCULAR | Status: AC
Start: 2022-11-21 — End: 2022-11-21
  Administered 2022-11-21: 1000 ug via INTRAMUSCULAR

## 2022-11-21 NOTE — Progress Notes (Signed)
Patient presents today for b12 injection.

## 2022-11-21 NOTE — Patient Instructions (Signed)
Vitamin B12 Deficiency Vitamin B12 deficiency means that your body does not have enough vitamin B12. The body needs this important vitamin: To make red blood cells. To make genes (DNA). To help the nerves work. If you do not have enough vitamin B12 in your body, you can have health problems, such as not having enough red blood cells in the blood (anemia). What are the causes? Not eating enough foods that contain vitamin B12. Not being able to take in (absorb) vitamin B12 from the food that you eat. Certain diseases. A condition in which the body does not make enough of a certain protein. This results in your body not taking in enough vitamin B12. Having a surgery in which part of the stomach or small intestine is taken out. Taking medicines that make it hard for the body to take in vitamin B12. These include: Heartburn medicines. Some medicines that are used to treat diabetes. What increases the risk? Being an older adult. Eating a vegetarian or vegan diet that does not include any foods that come from animals. Not eating enough foods that contain vitamin B12 while you are pregnant. Taking certain medicines. Having alcoholism. What are the signs or symptoms? In some cases, there are no symptoms. If the condition leads to too few blood cells or nerve damage, symptoms can occur, such as: Feeling weak or tired. Not being hungry. Losing feeling (numbness) or tingling in your hands and feet. Redness and burning of the tongue. Feeling sad (depressed). Confusion or memory problems. Trouble walking. If anemia is very bad, symptoms can include: Being short of breath. Being dizzy. Having a very fast heartbeat. How is this treated? Changing the way you eat and drink, such as: Eating more foods that contain vitamin B12. Drinking little or no alcohol. Getting vitamin B12 shots. Taking vitamin B12 supplements by mouth (orally). Your doctor will tell you the dose that is best for you. Follow  these instructions at home: Eating and drinking  Eat foods that come from animals and have a lot of vitamin B12 in them. These include: Meats and poultry. This includes beef, pork, chicken, [place], and organ meats, such as liver. Seafood, such as clams, rainbow trout, salmon, tuna, and haddock. Eggs. Dairy foods such as milk, yogurt, and cheese. Eat breakfast cereals that have vitamin B12 added to them (are fortified). Check the label. The items listed above may not be a complete list of foods and beverages you can eat and drink. Contact a dietitian for more information. Alcohol use Do not drink alcohol if: Your doctor tells you not to drink. You are pregnant, may be pregnant, or are planning to become pregnant. If you drink alcohol: Limit how much you have to: 0-1 drink a day for women. 0-2 drinks a day for men. Know how much alcohol is in your drink. In the [place] one drink equals one 12 oz bottle of beer (355 mL), one 5 oz glass of wine (148 mL), or one 1 oz glass of hard liquor (44 mL). General instructions Get any vitamin B12 shots if told by your doctor. Take supplements only as told by your doctor. Follow the directions. Keep all follow-up visits. Contact a doctor if: Your symptoms come back. Your symptoms get worse or do not get better with treatment. Get help right away if: You have trouble breathing. You have a very fast heartbeat. You have chest pain. You get dizzy. You faint. These symptoms may be an emergency. Get help right away. Call 911.  Do not wait to see if the symptoms will go away. Do not drive yourself to the hospital. Summary Vitamin B12 deficiency means that your body is not getting enough of the vitamin. In some cases, there are no symptoms of this condition. Treatment may include making a change in the way you eat and drink, getting shots, or taking supplements. Eat foods that have vitamin B12 in them. This information is not intended to replace advice  given to you by your health care provider. Make sure you discuss any questions you have with your health care provider. Document Revised: 09/10/2020 Document Reviewed: 09/10/2020 Elsevier Patient Education  2024 [employer].

## 2022-11-28 ENCOUNTER — Ambulatory Visit: Payer: Self-pay

## 2022-11-29 ENCOUNTER — Ambulatory Visit (INDEPENDENT_AMBULATORY_CARE_PROVIDER_SITE_OTHER): Payer: Medicare Other | Admitting: Pharmacist

## 2022-11-29 ENCOUNTER — Encounter: Payer: Self-pay | Admitting: Family Medicine

## 2022-11-29 ENCOUNTER — Ambulatory Visit: Payer: Self-pay | Admitting: Family Medicine

## 2022-11-29 VITALS — BP 130/82 | HR 71 | Temp 98.1°F | Ht 64.0 in | Wt 172.0 lb

## 2022-11-29 DIAGNOSIS — J302 Other seasonal allergic rhinitis: Secondary | ICD-10-CM | POA: Diagnosis not present

## 2022-11-29 DIAGNOSIS — I10 Essential (primary) hypertension: Secondary | ICD-10-CM

## 2022-11-29 DIAGNOSIS — R059 Cough, unspecified: Secondary | ICD-10-CM | POA: Diagnosis not present

## 2022-11-29 DIAGNOSIS — E538 Deficiency of other specified B group vitamins: Secondary | ICD-10-CM

## 2022-11-29 MED ORDER — TRIAMCINOLONE ACETONIDE 55 MCG/ACT NA AERO: 1.0000 | INHALATION_SPRAY | Freq: Two times a day (BID) | NASAL | 2 refills | Status: DC

## 2022-11-29 MED ORDER — LEVOCETIRIZINE DIHYDROCHLORIDE 5 MG PO TABS: 5.0000 mg | ORAL_TABLET | Freq: Every evening | ORAL | 3 refills | Status: DC

## 2022-11-29 MED ORDER — CYANOCOBALAMIN 1000 MCG/ML IJ SOLN
1000.0000 ug | Freq: Once | INTRAMUSCULAR | Status: AC
  Administered 2022-11-29: 1000 ug via INTRAMUSCULAR

## 2022-11-29 MED ORDER — BENZONATATE 100 MG PO CAPS: 100.0000 mg | ORAL_CAPSULE | Freq: Three times a day (TID) | ORAL | 0 refills | Status: AC | PRN

## 2022-11-29 NOTE — Progress Notes (Signed)
I,[name], CMA,acting as a Neurosurgeon for [employer], NP.,have documented all relevant documentation on the behalf of [name], NP,as directed by  [name], NP while in the presence of [name], NP.  Subjective:  Patient ID: Emily Greer , female    DOB: 1950/09/12 , 72 y.o.   MRN: [id number]  Chief Complaint  Patient presents with   URI    HPI  Patient is a 72 year old female who presents to the office with complaints of postnasal drainage and coughing. She denies fever, chills or body aches. She would like to get a refill of Tessalon Perles because it helped her the last time, she had a cold.     Past Medical History:  Diagnosis Date   Avascular necrosis of femoral head (HCC)    bilateral per CT 07-26-2015   Endometrial carcinoma [facility]) gyn oncologist-  dr Nelly Rout ([facility])/  radiation oncologist-- dr Roselind Messier   2013 dx  FIGO Stage 1A, Grade 2 endometrioid endometrial cancer s/p TAH w/ BSO and bilateral pelvic node dissection 10-31-2011 ;  recurrence at distal vagina 04/ 2014 s/p  brachytherapy (ended 07-29-2012);  2nd recurrence 12/ 2016  vaginal apex s/p  conformational radiotherapy 03-10-2015 to 04-20-2015   Family history of adverse reaction to anesthesia    mother ponv   GERD (gastroesophageal reflux disease)    Goals of care, counseling/discussion 08/21/2019   H/O stem cell transplant (HCC)    02/ 2000 and second one 06/ 2016   History of bacteremia    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   History of chemotherapy    last chemo 12-26-17   History of radiation therapy 6/4, 6/11, 6/19, 6/25, 07/29/2012   vagina 30.5 gray in 5 fx, HDR brachytherapy:   last radiation to vagina 03-10-2015 to 04-20-2015  50.4gray   History of radiation therapy 03/10/15-04/20/15   vagina 50.4 in 28 fractions   Hypertension    Lambda light chain myeloma [facility]) oncologist-  dr Myna Hidalgo ([facility])  and  [facility] -- dr Silvestre Mesi gasparetto   dx 07/  1999 s/p  VAD chemotherapy 11/ 1999,  purged autotransplant 03-21-1998 followed by auto stem cell transplant 03-29-1998;  recurrance w/ second autologous stem cell transplant 07-24-2014;  in Re-mission currently , chemo maintenance therapy   Osteoporosis 12/18/05   Increased  risk    PONV (postoperative nausea and vomiting)    Wears glasses      Family History  Problem Relation Age of Onset   Colon cancer Mother    Hypertension Father    Heart Problems Father      Current Outpatient Medications:    benzonatate (TESSALON PERLES) 100 MG capsule, Take 1 capsule (100 mg total) by mouth 3 (three) times daily as needed for cough., Disp: 30 capsule, Rfl: 0   levocetirizine (XYZAL ALLERGY 24HR) 5 MG tablet, Take 1 tablet (5 mg total) by mouth every evening., Disp: 30 tablet, Rfl: 3   triamcinolone (NASACORT) 55 MCG/ACT AERO nasal inhaler, Place 1 spray into the nose 2 (two) times daily., Disp: 1 each, Rfl: 2   acyclovir (ZOVIRAX) 400 MG tablet, Take 1 tablet (400 mg total) by mouth 2 (two) times daily., Disp: 60 tablet, Rfl: 6   albuterol (VENTOLIN HFA) 108 (90 Base) MCG/ACT inhaler, Inhale 2 puffs into the lungs every 6 (six) hours as needed for wheezing or shortness of breath. 2 puffs 3 times daily x 5 days then every 6 hours as  needed. (Patient not taking: Reported on 02/23/2022), Disp: 18 g, Rfl: 11   amLODipine (NORVASC) 10 MG tablet, TAKE 1 TABLET(10 MG) BY MOUTH EVERY MORNING, Disp: 90 tablet, Rfl: 1   aspirin EC 81 MG tablet, Take 81 mg by mouth daily., Disp: , Rfl:    calcium-vitamin D (OSCAL WITH D) 500-5 MG-MCG tablet, Take 1 tablet by mouth daily., Disp: , Rfl:    chlorhexidine (PERIDEX) 0.12 % solution, 10 mLs 2 (two) times daily., Disp: , Rfl:    Cholecalciferol (VITAMIN D3) 2000 UNITS TABS, Take 2 tablets by mouth daily., Disp: , Rfl:    metoprolol tartrate (LOPRESSOR) 50 MG tablet, Take 1/2 tablet by mouth twice daily, Disp: 90 tablet, Rfl: 1   potassium chloride SA (KLOR-CON M) 20  MEQ tablet, Take 1 tablet (20 mEq total) by mouth 2 (two) times daily., Disp: 60 tablet, Rfl: 3 No current facility-administered medications for this visit.  Facility-Administered Medications Ordered in Other Visits:    0.9 %  sodium chloride infusion, , Intravenous, Once PRN, Ennever, Rose Phi, MD   albuterol (PROVENTIL) (2.5 MG/3ML) 0.083% nebulizer solution 2.5 mg, 2.5 mg, Nebulization, Once PRN, Ennever, Rose Phi, MD   sodium chloride flush (NS) 0.9 % injection 10 mL, 10 mL, Intravenous, PRN, Erenest Blank, NP, 10 mL at 12/26/17 1130   sodium chloride flush (NS) 0.9 % injection 10 mL, 10 mL, Intravenous, PRN, Josph Macho, MD, 10 mL at 04/24/19 1305   sodium chloride flush (NS) 0.9 % injection 10 mL, 10 mL, Intravenous, PRN, Josph Macho, MD, 10 mL at 08/21/19 1233   sodium chloride flush (NS) 0.9 % injection 10 mL, 10 mL, Intracatheter, PRN, Erenest Blank, NP, 10 mL at 10/01/19 1053   sodium chloride flush (NS) 0.9 % injection 10 mL, 10 mL, Intracatheter, PRN, Erenest Blank, NP, 10 mL at 12/11/19 1414   sodium chloride flush (NS) 0.9 % injection 10 mL, 10 mL, Intravenous, PRN, Erenest Blank, NP, 10 mL at 07/06/21 1105   sodium chloride flush (NS) 0.9 % injection 10 mL, 10 mL, Intracatheter, PRN, Erenest Blank, NP, 10 mL at 07/14/21 0931   Allergies  Allergen Reactions   Codeine Nausea Only     Review of Systems  Constitutional: Negative.   HENT:  Positive for congestion, postnasal drip, rhinorrhea and sneezing.   Eyes: Negative.   Respiratory:  Negative for cough.   Cardiovascular: Negative.   Gastrointestinal: Negative.   Endocrine: Negative.      Today's Vitals   11/29/22 1119  BP: 130/82  Pulse: 71  Temp: 98.1 F (36.7 C)  Weight: 172 lb (78 kg)  Height: 5\' 4"  (1.626 m)   Body mass index is 29.52 kg/m.  Wt Readings from Last 3 Encounters:  11/29/22 172 lb (78 kg)  11/21/22 176 lb (79.8 kg)  11/14/22 176 lb (79.8 kg)    The 10-year ASCVD risk score  (Arnett DK, et al., 2019) is: 11.3%   Values used to calculate the score:     Age: 89 years     Sex: Female     Is Non-Hispanic African American: Yes     Diabetic: No     Tobacco smoker: No     Systolic Blood Pressure: 130 mmHg     Is BP treated: Yes     HDL Cholesterol: 74 mg/dL     Total Cholesterol: 177 mg/dL  Objective:  Physical Exam HENT:     Head: Normocephalic.  Cardiovascular:     Rate and Rhythm: Normal rate and regular rhythm.  Pulmonary:     Effort: Pulmonary effort is normal.     Breath sounds: Normal breath sounds.  Skin:    General: Skin is warm.  Neurological:     Mental Status: She is alert and oriented to person, place, and time.         Assessment And Plan:  Seasonal allergic rhinitis, unspecified trigger -     Triamcinolone Acetonide; Place 1 spray into the nose 2 (two) times daily.  Dispense: 1 each; Refill: 2 -     Levocetirizine Dihydrochloride; Take 1 tablet (5 mg total) by mouth every evening.  Dispense: 30 tablet; Refill: 3  Cough in adult -     Benzonatate; Take 1 capsule (100 mg total) by mouth 3 (three) times daily as needed for cough.  Dispense: 30 capsule; Refill: 0  B12 deficiency -     Cyanocobalamin    No follow-ups on file.  Patient was given opportunity to ask questions. Patient verbalized understanding of the plan and was able to repeat key elements of the plan. All questions were answered to their satisfaction.   I, [name], NP, have reviewed all documentation for this visit. The documentation on 12/01/2022 for the exam, diagnosis, procedures, and orders are all accurate and complete.     IF YOU HAVE BEEN REFERRED TO A SPECIALIST, IT MAY TAKE 1-2 WEEKS TO SCHEDULE/PROCESS THE REFERRAL. IF YOU HAVE NOT HEARD FROM US/SPECIALIST IN TWO WEEKS, PLEASE GIVE [place] A CALL AT [phone].

## 2022-11-29 NOTE — Patient Instructions (Signed)
Ms. Fearnow,    It was great talking to you today!  Check your blood pressure periodically, and any time you have concerning symptoms like headache, chest pain, dizziness, shortness of breath, or vision changes.   Our goal is less than 130/80.  To appropriately check your blood pressure, make sure you do the following:  1) Avoid caffeine, exercise, or tobacco products for 30 minutes before checking. Empty your bladder. 2) Sit with your back supported in a flat-backed chair. Rest your arm on something flat (arm of the chair, table, etc). 3) Sit still with your feet flat on the floor, resting, for at least 5 minutes.  4) Check your blood pressure. Take 1-2 readings.  5) Write down these readings and bring with you to any provider appointments.  Bring your home blood pressure machine with you to a provider's office for accuracy comparison at least once a year.   Make sure you take your blood pressure medications before you come to any office visit, even if you were asked to fast for labs.   Take care!  Catie Eppie Gibson, PharmD, BCACP, CPP Clinical Pharmacist [facility] [phone]

## 2022-11-29 NOTE — Progress Notes (Signed)
11/29/2022 Name: Emily Greer MRN: [id number] DOB: 16-Nov-1950  No chief complaint on file.   Emily Greer is a 72 y.o. year old female who was referred for medication management by their primary care provider, Emily Peng, MD. They presented for a face to face visit today.   They were referred to the pharmacist by their PCP for assistance in managing hypertension    Subjective:  Care Team: Primary Care Provider: [name], MD ; Next Scheduled Visit: 03/05/23  Medication Access/Adherence  Current Pharmacy:  [id number] [place], [place] - [address] AT [facility] & [address] [place] [address] Phone: [phone] Fax: [phone]  [facility] - [facility], [place] - [address] [place] [address] Phone: [phone] Fax: [phone]  [id number] - [place], [place] - [address] AT [facility] [address] [place] [address] Phone: [phone] Fax: [phone]   Patient reports affordability concerns with their medications: No  Patient reports access/transportation concerns to their pharmacy: No  Patient reports adherence concerns with their medications:  No     Hypertension:  Current medications: amlodipine 10 mg daily, metoprolol tartrate 12.5 mg twice daily  Patient does not have a validated, automated, upper arm home BP cuff Current blood pressure readings readings: has not been checking  Patient denies hypotensive s/sx including dizziness, lightheadedness.  Patient denies hypertensive symptoms including headache, chest pain, shortness of breath  Objective:  Lab Results  Component Value Date   HGBA1C 5.7 (H) 06/01/2014    Lab Results  Component Value Date   CREATININE 0.85 10/11/2022   BUN 12 10/11/2022   NA 141 10/11/2022   K 3.5 10/11/2022   CL 107 10/11/2022   CO2 26  10/11/2022    Lab Results  Component Value Date   CHOL 177 11/01/2022   HDL 74 11/01/2022   LDLCALC 82 11/01/2022   TRIG 124 11/01/2022   CHOLHDL 2.4 11/01/2022    Medications Reviewed Today     Reviewed by Alden Hipp, RPH-CPP (Pharmacist) on 11/29/22 at 1145  Med List Status: <None>   Medication Order Taking? Sig Documenting Provider Last Dose Status Informant  0.9 %  sodium chloride infusion [id number]   Josph Macho, MD  Active   acyclovir (ZOVIRAX) 400 MG tablet [id number] Yes Take 1 tablet (400 mg total) by mouth 2 (two) times daily. Josph Macho, MD Taking Active   albuterol (PROVENTIL) (2.5 MG/3ML) 0.083% nebulizer solution 2.5 mg [id number]   Josph Macho, MD  Active   albuterol (VENTOLIN HFA) 108 (90 Base) MCG/ACT inhaler [id number] No Inhale 2 puffs into the lungs every 6 (six) hours as needed for wheezing or shortness of breath. 2 puffs 3 times daily x 5 days then every 6 hours as needed.  Patient not taking: Reported on 02/23/2022   Emily Peng, MD Not Taking Active   amLODipine (NORVASC) 10 MG tablet [id number] Yes TAKE 1 TABLET(10 MG) BY MOUTH EVERY MORNING Emily Peng, MD Taking Active   aspirin EC 81 MG tablet [id number] Yes Take 81 mg by mouth daily. [provider] Taking Active   Patient not taking:  Discontinued 11/29/22 1145   calcium-vitamin D (OSCAL WITH D) 500-5 MG-MCG tablet [id number] Yes Take 1 tablet by mouth daily. [provider] Taking Active   Patient not taking:  Discontinued 11/29/22 1145  chlorhexidine (PERIDEX) 0.12 % solution [id number] Yes 10 mLs 2 (two) times daily. [provider] Taking Active   Cholecalciferol (VITAMIN D3) 2000 UNITS TABS [id number] Yes Take 2 tablets by mouth daily. [provider] Taking Active            Med Note Wayland Denis Jul 19, 2022  2:36 PM)    Patient not taking:  Discontinued 11/29/22 1145   metoprolol tartrate (LOPRESSOR) 50 MG tablet [id number]  Yes Take 1/2 tablet by mouth twice daily Emily Peng, MD Taking Active   Patient not taking:  Discontinued 11/29/22 1145   potassium chloride SA (KLOR-CON M) 20 MEQ tablet [id number] Yes Take 1 tablet (20 mEq total) by mouth 2 (two) times daily. Josph Macho, MD Taking Active     Discontinued 07/14/21 0913   sodium chloride flush (NS) 0.9 % injection 10 mL [id number]   Erenest Blank, NP  Active   sodium chloride flush (NS) 0.9 % injection 10 mL [id number]   Josph Macho, MD  Active   sodium chloride flush (NS) 0.9 % injection 10 mL [id number]   Josph Macho, MD  Active   sodium chloride flush (NS) 0.9 % injection 10 mL [id number]   Erenest Blank, NP  Active   sodium chloride flush (NS) 0.9 % injection 10 mL [id number]   Erenest Blank, NP  Active   sodium chloride flush (NS) 0.9 % injection 10 mL [id number]   Erenest Blank, NP  Active   sodium chloride flush (NS) 0.9 % injection 10 mL [id number]   Erenest Blank, NP  Active    Patient not taking:   Discontinued 11/29/22 1145 Patient not taking:  Discontinued 11/29/22 1145   Med List Note Otis Peak, [facility] 06/14/21 1110): [facility]st [facility]              Assessment/Plan:   Hypertension: - Currently controlled - Reviewed long term cardiovascular and renal outcomes of uncontrolled blood pressure - Reviewed appropriate blood pressure monitoring technique and reviewed goal blood pressure. Recommended to check home blood pressure and heart rate periodically, document, and provide at future appointments - Discussed lifestyle modifications including reduction in salt intake, focus on lean proteins, fruits and vegetables, whole grains. Discussed goals of increased physical activity - Recommend to continue current regimen at this time    Follow Up Plan: goals of pharmacist involvement met.   Catie Eppie Gibson, PharmD, BCACP, CPP Clinical Pharmacist [facility] [phone]

## 2022-12-04 IMAGING — CT CT BIOPSY AND ASPIRATION BONE MARROW
1 of 2 series · 12 of 29 positions shown, 15 images · non-contrast
Comparison: none

CLINICAL DATA: Recurrent myeloma

EXAM:
CT GUIDED DEEP ILIAC BONE ASPIRATION AND CORE BIOPSY
TECHNIQUE: Patient was placed prone on the CT gantry and limited axial scans
through the pelvis were obtained. Appropriate skin entry site was
identified. Skin site was marked, prepped with chlorhexidine, draped
in usual sterile fashion, and infiltrated locally with 1% lidocaine.

[Series 2: i-spiral 5.0 br40 · axial · 0.98mm/px · z∈[-420,-312]mm · 12 of 39 slices shown, 15 images]
[im 4/39  mediastinal]
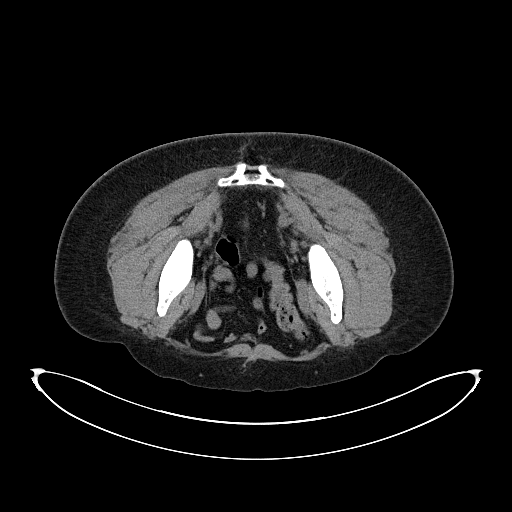
[im 4/39  lung]
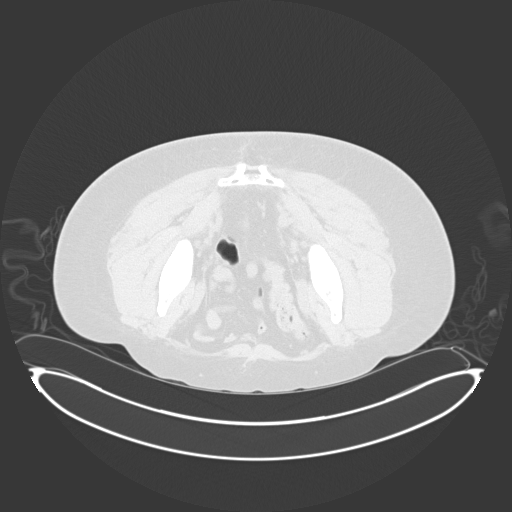
[im 7/39  lung]
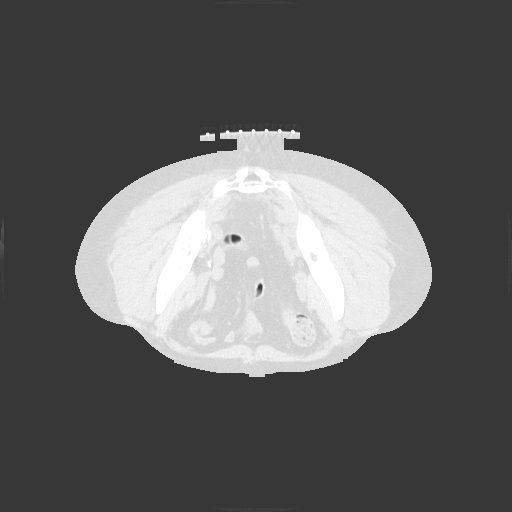
[im 10/39  lung]
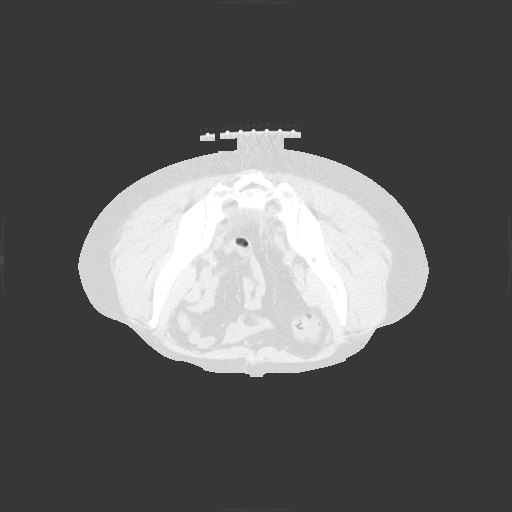
[im 13/39  lung]
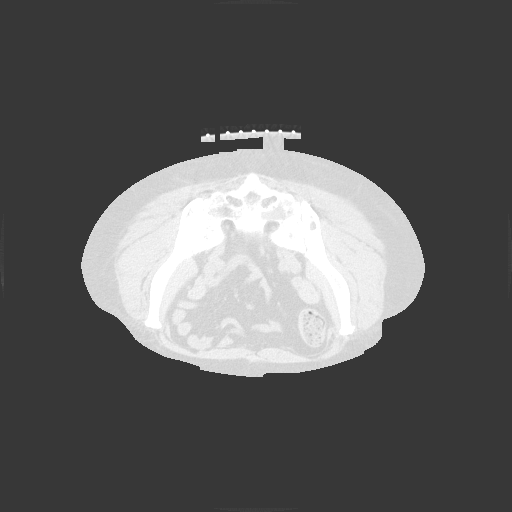
[im 16/39  mediastinal]
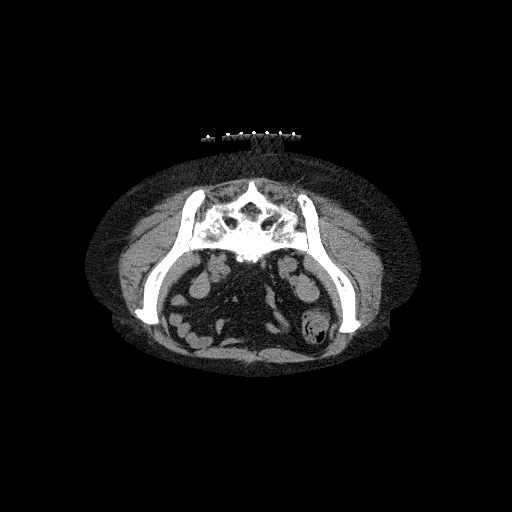
[im 16/39  lung]
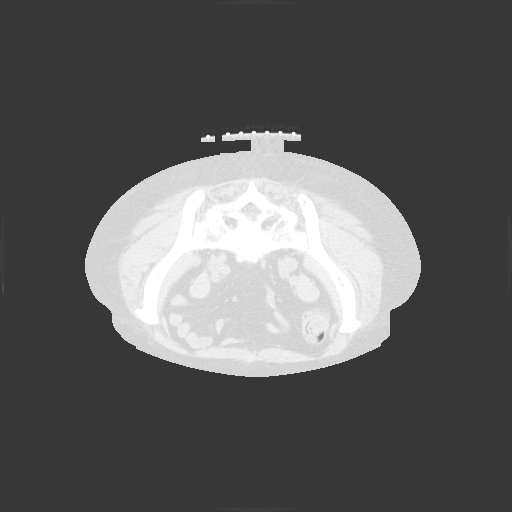
[im 18/39  lung]
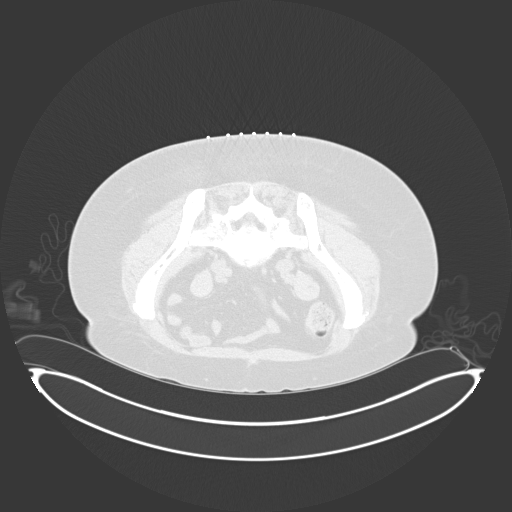
[im 20/39  lung]
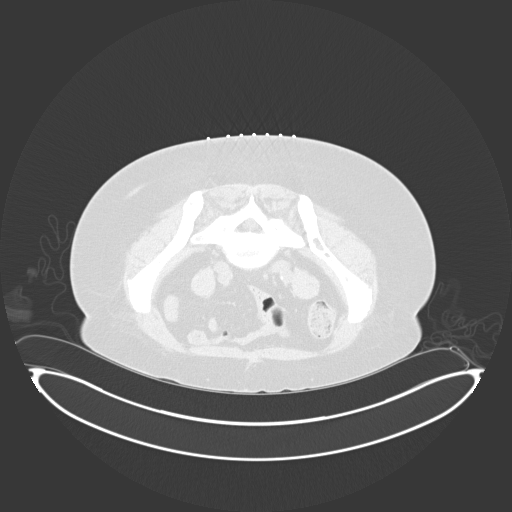
[im 23/39  lung]
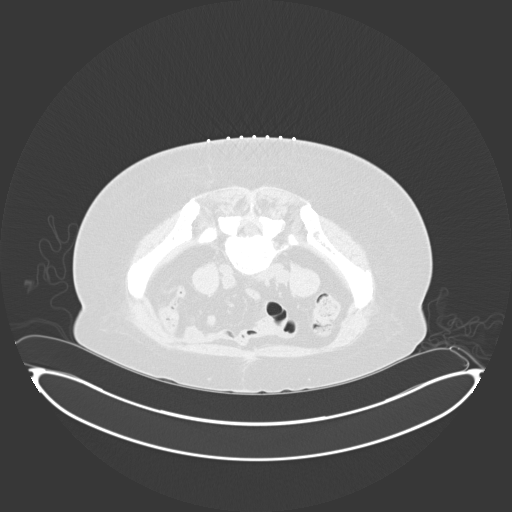
[im 26/39  mediastinal]
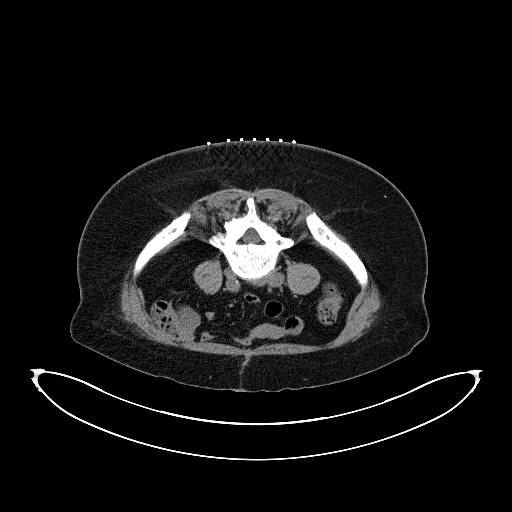
[im 26/39  lung]
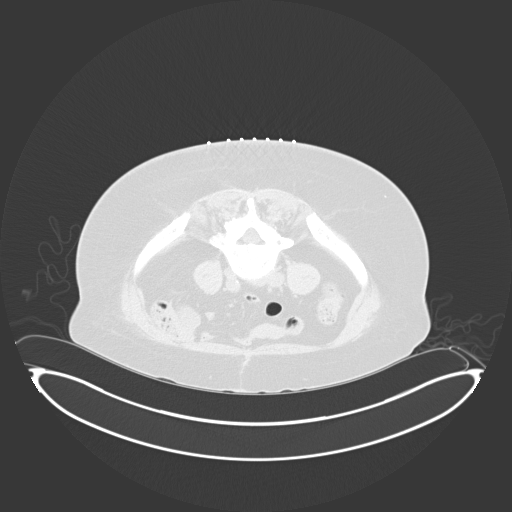
[im 29/39  lung]
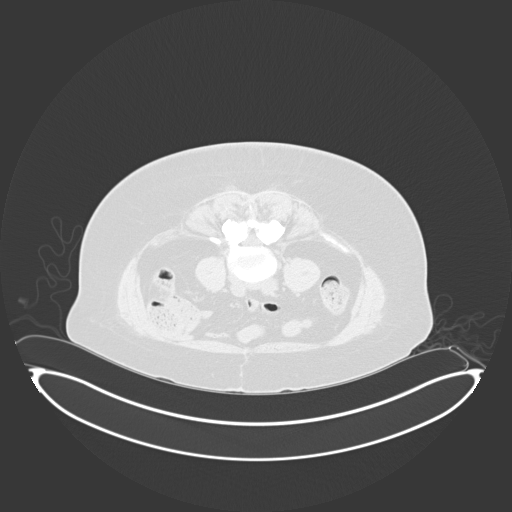
[im 32/39  lung]
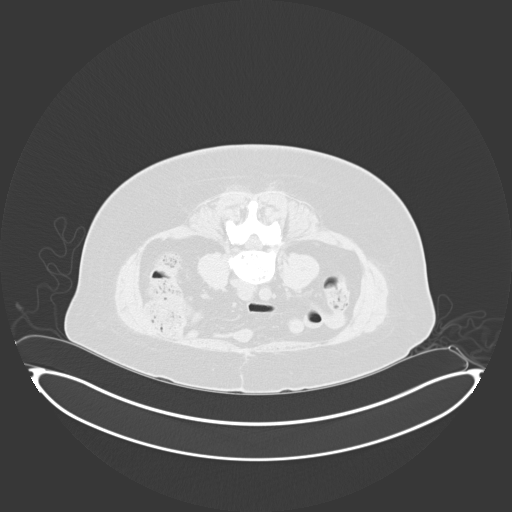
[im 35/39  lung]
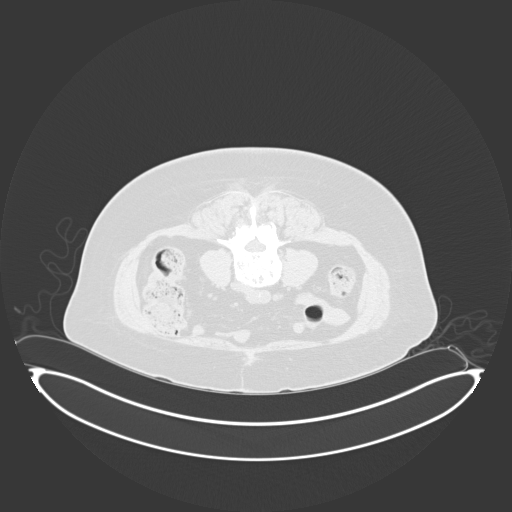

[12 of 29 positions shown; findings below may reference images not displayed]

Intravenous Fentanyl 033mcg and Versed 2mg were administered as
conscious sedation during continuous monitoring of the patient's
level of consciousness and physiological / cardiorespiratory status
by the radiology RN, with a total moderate sedation time of 20
minutes.

Under CT fluoroscopic guidance an 11-gauge Cook trocar bone needle
was advanced into the right iliac bone just lateral to the
sacroiliac joint. Once needle tip position was confirmed, core and
aspiration samples were obtained, submitted to pathology for
approval. Additional core biopsy samples obtained x2. Post procedure
scans show no hematoma or fracture. Patient tolerated procedure
well.

COMPLICATIONS:
COMPLICATIONS
none
IMPRESSION: 1. Technically successful CT guided right iliac bone core and
aspiration biopsy.

## 2023-01-16 DIAGNOSIS — M87180 Osteonecrosis due to drugs, jaw: Secondary | ICD-10-CM | POA: Diagnosis not present

## 2023-01-16 DIAGNOSIS — T458X5A Adverse effect of other primarily systemic and hematological agents, initial encounter: Secondary | ICD-10-CM | POA: Diagnosis not present

## 2023-01-29 ENCOUNTER — Encounter: Payer: Self-pay | Admitting: Hematology & Oncology

## 2023-02-15 DIAGNOSIS — Z800 Family history of malignant neoplasm of digestive organs: Secondary | ICD-10-CM | POA: Diagnosis not present

## 2023-02-15 DIAGNOSIS — K5520 Angiodysplasia of colon without hemorrhage: Secondary | ICD-10-CM | POA: Diagnosis not present

## 2023-02-15 DIAGNOSIS — Z1211 Encounter for screening for malignant neoplasm of colon: Secondary | ICD-10-CM | POA: Diagnosis not present

## 2023-02-15 LAB — HM COLONOSCOPY

## 2023-02-22 ENCOUNTER — Other Ambulatory Visit: Payer: Self-pay

## 2023-02-22 DIAGNOSIS — I10 Essential (primary) hypertension: Secondary | ICD-10-CM

## 2023-02-22 MED ORDER — AMLODIPINE BESYLATE 10 MG PO TABS: ORAL_TABLET | ORAL | 2 refills | Status: DC

## 2023-03-01 DIAGNOSIS — C9000 Multiple myeloma not having achieved remission: Secondary | ICD-10-CM | POA: Diagnosis not present

## 2023-03-01 DIAGNOSIS — C9001 Multiple myeloma in remission: Secondary | ICD-10-CM | POA: Diagnosis not present

## 2023-03-01 DIAGNOSIS — R6889 Other general symptoms and signs: Secondary | ICD-10-CM | POA: Diagnosis not present

## 2023-03-01 DIAGNOSIS — Z9289 Personal history of other medical treatment: Secondary | ICD-10-CM | POA: Diagnosis not present

## 2023-03-05 ENCOUNTER — Ambulatory Visit (INDEPENDENT_AMBULATORY_CARE_PROVIDER_SITE_OTHER): Payer: Medicare Other | Admitting: Internal Medicine

## 2023-03-05 ENCOUNTER — Encounter: Payer: Self-pay | Admitting: Hematology & Oncology

## 2023-03-05 ENCOUNTER — Encounter: Payer: Self-pay | Admitting: Internal Medicine

## 2023-03-05 VITALS — BP 124/82 | HR 78 | Temp 98.2°F | Ht 64.0 in | Wt 175.4 lb

## 2023-03-05 DIAGNOSIS — Z9484 Stem cells transplant status: Secondary | ICD-10-CM | POA: Diagnosis not present

## 2023-03-05 DIAGNOSIS — E538 Deficiency of other specified B group vitamins: Secondary | ICD-10-CM | POA: Diagnosis not present

## 2023-03-05 DIAGNOSIS — E66811 Obesity, class 1: Secondary | ICD-10-CM | POA: Diagnosis not present

## 2023-03-05 DIAGNOSIS — I10 Essential (primary) hypertension: Secondary | ICD-10-CM

## 2023-03-05 DIAGNOSIS — Z6830 Body mass index (BMI) 30.0-30.9, adult: Secondary | ICD-10-CM

## 2023-03-05 DIAGNOSIS — C9001 Multiple myeloma in remission: Secondary | ICD-10-CM

## 2023-03-05 DIAGNOSIS — R413 Other amnesia: Secondary | ICD-10-CM

## 2023-03-05 DIAGNOSIS — E6609 Other obesity due to excess calories: Secondary | ICD-10-CM | POA: Diagnosis not present

## 2023-03-05 DIAGNOSIS — R7309 Other abnormal glucose: Secondary | ICD-10-CM | POA: Diagnosis not present

## 2023-03-05 DIAGNOSIS — R0981 Nasal congestion: Secondary | ICD-10-CM

## 2023-03-05 MED ORDER — CYANOCOBALAMIN 1000 MCG/ML IJ SOLN
1000.0000 ug | Freq: Once | INTRAMUSCULAR | Status: AC
  Administered 2023-03-05: 1000 ug via INTRAMUSCULAR

## 2023-03-05 NOTE — Patient Instructions (Signed)
 Hypertension, Adult Hypertension is another name for high blood pressure. High blood pressure forces your heart to work harder to pump blood. This can cause problems over time. There are two numbers in a blood pressure reading. There is a top number (systolic) over a bottom number (diastolic). It is best to have a blood pressure that is below 120/80. What are the causes? The cause of this condition is not known. Some other conditions can lead to high blood pressure. What increases the risk? Some lifestyle factors can make you more likely to develop high blood pressure: Smoking. Not getting enough exercise or physical activity. Being overweight. Having too much fat, sugar, calories, or salt (sodium) in your diet. Drinking too much alcohol. Other risk factors include: Having any of these conditions: Heart disease. Diabetes. High cholesterol. Kidney disease. Obstructive sleep apnea. Having a family history of high blood pressure and high cholesterol. Age. The risk increases with age. Stress. What are the signs or symptoms? High blood pressure may not cause symptoms. Very high blood pressure (hypertensive crisis) may cause: Headache. Fast or uneven heartbeats (palpitations). Shortness of breath. Nosebleed. Vomiting or feeling like you may vomit (nauseous). Changes in how you see. Very bad chest pain. Feeling dizzy. Seizures. How is this treated? This condition is treated by making healthy lifestyle changes, such as: Eating healthy foods. Exercising more. Drinking less alcohol. Your doctor may prescribe medicine if lifestyle changes do not help enough and if: Your top number is above 130. Your bottom number is above 80. Your personal target blood pressure may vary. Follow these instructions at home: Eating and drinking  If told, follow the DASH eating plan. To follow this plan: Fill one half of your plate at each meal with fruits and vegetables. Fill one fourth of your plate  at each meal with whole grains. Whole grains include whole-wheat pasta, [place] rice, and whole-grain bread. Eat or drink low-fat dairy products, such as skim milk or low-fat yogurt. Fill one fourth of your plate at each meal with low-fat (lean) proteins. Low-fat proteins include fish, chicken without skin, eggs, beans, and tofu. Avoid fatty meat, cured and processed meat, or chicken with skin. Avoid pre-made or processed food. Limit the amount of salt in your diet to less than 1,500 mg each day. Do not drink alcohol if: Your doctor tells you not to drink. You are pregnant, may be pregnant, or are planning to become pregnant. If you drink alcohol: Limit how much you have to: 0-1 drink a day for women. 0-2 drinks a day for men. Know how much alcohol is in your drink. In the [place] one drink equals one 12 oz bottle of beer (355 mL), one 5 oz glass of wine (148 mL), or one 1 oz glass of hard liquor (44 mL). Lifestyle  Work with your doctor to stay at a healthy weight or to lose weight. Ask your doctor what the best weight is for you. Get at least 30 minutes of exercise that causes your heart to beat faster (aerobic exercise) most days of the week. This may include walking, swimming, or biking. Get at least 30 minutes of exercise that strengthens your muscles (resistance exercise) at least 3 days a week. This may include lifting weights or doing Pilates. Do not smoke or use any products that contain nicotine or tobacco. If you need help quitting, ask your doctor. Check your blood pressure at home as told by your doctor. Keep all follow-up visits. Medicines Take over-the-counter and prescription medicines  only as told by your doctor. Follow directions carefully. Do not skip doses of blood pressure medicine. The medicine does not work as well if you skip doses. Skipping doses also puts you at risk for problems. Ask your doctor about side effects or reactions to medicines that you should watch  for. Contact a doctor if: You think you are having a reaction to the medicine you are taking. You have headaches that keep coming back. You feel dizzy. You have swelling in your ankles. You have trouble with your vision. Get help right away if: You get a very bad headache. You start to feel mixed up (confused). You feel weak or numb. You feel faint. You have very bad pain in your: Chest. Belly (abdomen). You vomit more than once. You have trouble breathing. These symptoms may be an emergency. Get help right away. Call 911. Do not wait to see if the symptoms will go away. Do not drive yourself to the hospital. Summary Hypertension is another name for high blood pressure. High blood pressure forces your heart to work harder to pump blood. For most people, a normal blood pressure is less than 120/80. Making healthy choices can help lower blood pressure. If your blood pressure does not get lower with healthy choices, you may need to take medicine. This information is not intended to replace advice given to you by your health care provider. Make sure you discuss any questions you have with your health care provider. Document Revised: 11/04/2020 Document Reviewed: 11/04/2020 Elsevier Patient Education  2024 [employer].

## 2023-03-05 NOTE — Progress Notes (Unsigned)
I,Victoria T Deloria Lair, CMA,acting as a Neurosurgeon for Gwynneth Aliment, MD.,have documented all relevant documentation on the behalf of Gwynneth Aliment, MD,as directed by  Gwynneth Aliment, MD while in the presence of Gwynneth Aliment, MD.  Subjective:  Patient ID: Emily Greer , female    DOB: [birth year] , 73 y.o.   MRN: [id number]  Chief Complaint  Patient presents with   Hypertension    HPI  She presents today for BP f/u. She reports compliance with meds. Denies headache, chest pain, and SOB. She reports compliance with meds. She is accompanied by her husband today. She complains of nasal congestion. Denies fever. She states this has been an ongoing issue for 3 weeks.  She will soon schedule mammogram.      Hypertension This is a chronic problem. The current episode started more than 1 year ago. The problem has been gradually improving since onset. The problem is controlled. Pertinent negatives include no blurred vision. Past treatments include beta blockers and diuretics. The current treatment provides moderate improvement. Compliance problems include exercise.  There is no history of pheochromocytoma or renovascular disease.     Past Medical History:  Diagnosis Date   Avascular necrosis of femoral head (HCC)    bilateral per CT 07-26-2015   Endometrial carcinoma [facility]) gyn oncologist-  dr Nelly Rout ([facility])/  radiation oncologist-- dr Roselind Messier   2013 dx  FIGO Stage 1A, Grade 2 endometrioid endometrial cancer s/p TAH w/ BSO and bilateral pelvic node dissection 10-31-2011 ;  recurrence at distal vagina 04/ 2014 s/p  brachytherapy (ended 07-29-2012);  2nd recurrence 12/ 2016  vaginal apex s/p  conformational radiotherapy 03-10-2015 to 04-20-2015   Family history of adverse reaction to anesthesia    mother ponv   GERD (gastroesophageal reflux disease)    Goals of care, counseling/discussion 08/21/2019   H/O stem cell transplant (HCC)    02/ 2000 and second one 06/ 2016   History  of bacteremia    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   History of chemotherapy    last chemo 12-26-17   History of radiation therapy 6/4, 6/11, 6/19, 6/25, 07/29/2012   vagina 30.5 gray in 5 fx, HDR brachytherapy:   last radiation to vagina 03-10-2015 to 04-20-2015  50.4gray   History of radiation therapy 03/10/15-04/20/15   vagina 50.4 in 28 fractions   Hypertension    Lambda light chain myeloma [facility]) oncologist-  dr Myna Hidalgo ([facility])  and  [facility] -- dr Silvestre Mesi gasparetto   dx 07/ 1999 s/p  VAD chemotherapy 11/ 1999,  purged autotransplant 03-21-1998 followed by auto stem cell transplant 03-29-1998;  recurrance w/ second autologous stem cell transplant 07-24-2014;  in Re-mission currently , chemo maintenance therapy   Osteoporosis 12/18/05   Increased  risk    PONV (postoperative nausea and vomiting)    Wears glasses      Family History  Problem Relation Age of Onset   Colon cancer Mother    Hypertension Father    Heart Problems Father      Current Outpatient Medications:    acyclovir (ZOVIRAX) 400 MG tablet, Take 1 tablet (400 mg total) by mouth 2 (two) times daily., Disp: 60 tablet, Rfl: 6   amLODipine (NORVASC) 10 MG tablet, TAKE 1 TABLET(10 MG) BY MOUTH EVERY MORNING (Patient taking differently: TAKE 1 TABLET(10 MG) BY MOUTH EVERY MORNING.), Disp: 90 tablet, Rfl: 2   aspirin EC 81 MG tablet, Take 81 mg by mouth  daily., Disp: , Rfl:    benzonatate (TESSALON PERLES) 100 MG capsule, Take 1 capsule (100 mg total) by mouth 3 (three) times daily as needed for cough., Disp: 30 capsule, Rfl: 0   calcium-vitamin D (OSCAL WITH D) 500-5 MG-MCG tablet, Take 1 tablet by mouth daily., Disp: , Rfl:    chlorhexidine (PERIDEX) 0.12 % solution, 10 mLs 2 (two) times daily., Disp: , Rfl:    Cholecalciferol (VITAMIN D3) 2000 UNITS TABS, Take 2 tablets by mouth daily., Disp: , Rfl:    metoprolol tartrate (LOPRESSOR) 50 MG tablet, Take 1/2 tablet by mouth twice daily, Disp:  90 tablet, Rfl: 1   potassium chloride SA (KLOR-CON M) 20 MEQ tablet, Take 1 tablet (20 mEq total) by mouth 2 (two) times daily., Disp: 60 tablet, Rfl: 3   triamcinolone (NASACORT) 55 MCG/ACT AERO nasal inhaler, Place 1 spray into the nose 2 (two) times daily., Disp: 1 each, Rfl: 2   albuterol (VENTOLIN HFA) 108 (90 Base) MCG/ACT inhaler, Inhale 2 puffs into the lungs every 6 (six) hours as needed for wheezing or shortness of breath. 2 puffs 3 times daily x 5 days then every 6 hours as needed. (Patient not taking: Reported on 03/05/2023), Disp: 18 g, Rfl: 11   letrozole (FEMARA) 2.5 MG tablet, Take by mouth., Disp: , Rfl:    levocetirizine (XYZAL ALLERGY 24HR) 5 MG tablet, Take 1 tablet (5 mg total) by mouth every evening. (Patient not taking: Reported on 03/05/2023), Disp: 30 tablet, Rfl: 3 No current facility-administered medications for this visit.  Facility-Administered Medications Ordered in Other Visits:    0.9 %  sodium chloride infusion, , Intravenous, Once PRN, Ennever, Rose Phi, MD   albuterol (PROVENTIL) (2.5 MG/3ML) 0.083% nebulizer solution 2.5 mg, 2.5 mg, Nebulization, Once PRN, Ennever, Rose Phi, MD   sodium chloride flush (NS) 0.9 % injection 10 mL, 10 mL, Intravenous, PRN, Erenest Blank, NP, 10 mL at 12/26/17 1130   sodium chloride flush (NS) 0.9 % injection 10 mL, 10 mL, Intravenous, PRN, Josph Macho, MD, 10 mL at 04/24/19 1305   sodium chloride flush (NS) 0.9 % injection 10 mL, 10 mL, Intravenous, PRN, Josph Macho, MD, 10 mL at 08/21/19 1233   sodium chloride flush (NS) 0.9 % injection 10 mL, 10 mL, Intracatheter, PRN, Erenest Blank, NP, 10 mL at 10/01/19 1053   sodium chloride flush (NS) 0.9 % injection 10 mL, 10 mL, Intracatheter, PRN, Erenest Blank, NP, 10 mL at 12/11/19 1414   sodium chloride flush (NS) 0.9 % injection 10 mL, 10 mL, Intravenous, PRN, Erenest Blank, NP, 10 mL at 07/06/21 1105   sodium chloride flush (NS) 0.9 % injection 10 mL, 10 mL, Intracatheter,  PRN, Erenest Blank, NP, 10 mL at 07/14/21 0931   Allergies  Allergen Reactions   Codeine Nausea Only     Review of Systems  Constitutional: Negative.   Eyes:  Negative for blurred vision.  Respiratory: Negative.    Cardiovascular: Negative.   Neurological: Negative.   Psychiatric/Behavioral: Negative.       Today's Vitals   03/05/23 1147  BP: 124/82  Pulse: 78  Temp: 98.2 F (36.8 C)  SpO2: 98%  Weight: 175 lb 6.4 oz (79.6 kg)  Height: 5\' 4"  (1.626 m)   Body mass index is 30.11 kg/m.  Wt Readings from Last 3 Encounters:  03/05/23 175 lb 6.4 oz (79.6 kg)  11/29/22 172 lb (78 kg)  11/21/22 176 lb (79.8 kg)  BP Readings from Last 3 Encounters:  03/05/23 124/82  11/29/22 130/82  11/21/22 134/82   Hccyu                                          Objective:  Physical Exam      Assessment And Plan:  Primary hypertension  Stem cells transplant status (HCC)  Class 1 obesity due to excess calories with serious comorbidity and body mass index (BMI) of 30.0 to 30.9 in adult  She is encouraged to strive for BMI less than 30 to decrease cardiac risk. Advised to aim for at least 150 minutes of exercise per week.    No follow-ups on file.  Patient was given opportunity to ask questions. Patient verbalized understanding of the plan and was able to repeat key elements of the plan. All questions were answered to their satisfaction.  Gwynneth Aliment, MD  I, Gwynneth Aliment, MD, have reviewed all documentation for this visit. The documentation on 03/05/23 for the exam, diagnosis, procedures, and orders are all accurate and complete.   IF YOU HAVE BEEN REFERRED TO A SPECIALIST, IT MAY TAKE 1-2 WEEKS TO SCHEDULE/PROCESS THE REFERRAL. IF YOU HAVE NOT HEARD FROM US/SPECIALIST IN TWO WEEKS, PLEASE GIVE [place] A CALL AT [phone] X [phone].   THE PATIENT IS ENCOURAGED TO PRACTICE SOCIAL DISTANCING DUE TO THE COVID-19 PANDEMIC.

## 2023-03-06 ENCOUNTER — Encounter: Payer: Self-pay | Admitting: Internal Medicine

## 2023-03-06 DIAGNOSIS — E538 Deficiency of other specified B group vitamins: Secondary | ICD-10-CM | POA: Insufficient documentation

## 2023-03-06 DIAGNOSIS — R0981 Nasal congestion: Secondary | ICD-10-CM | POA: Insufficient documentation

## 2023-03-06 DIAGNOSIS — R413 Other amnesia: Secondary | ICD-10-CM | POA: Insufficient documentation

## 2023-03-06 DIAGNOSIS — J309 Allergic rhinitis, unspecified: Secondary | ICD-10-CM | POA: Insufficient documentation

## 2023-03-06 LAB — VITAMIN B12: Vitamin B-12: 257 pg/mL (ref 232–1245)

## 2023-03-06 LAB — HEMOGLOBIN A1C
Est. average glucose Bld gHb Est-mCnc: 114 mg/dL
Hgb A1c MFr Bld: 5.6 % (ref 4.8–5.6)

## 2023-03-06 NOTE — Assessment & Plan Note (Signed)
 Chronic, controlled.  I thanked her for bringing in all meds, we went through each medication in detail - both her current supply of meds and her current med list. She agrees to use a weekly pill box, her husband agrees to assist. All questions were answered to their satisfaction. She will rto in four months for re-evaluation.

## 2023-03-06 NOTE — Assessment & Plan Note (Signed)
We discussed that this can be due to "having a lot on her plate and being spread too thin".  She also has h/o vitamin B12 deficiency. I will recheck her levels today. If she has no improvement with B12 supplementation, will consider Neurology referral.

## 2023-03-06 NOTE — Assessment & Plan Note (Signed)
 Previous labs reviewed, her A1c has been elevated in the past. I will check an A1c today. Reminded to avoid refined sugars including sugary drinks/foods and processed meats including bacon, sausages and deli meats.

## 2023-03-06 NOTE — Assessment & Plan Note (Signed)
She was given vitamin B12 injection x 1 after her labs were drawn. I will determine dosing schedule once her results are available.

## 2023-03-06 NOTE — Assessment & Plan Note (Signed)
She admits she has not been taking her OTC allergy meds. She is encouraged to resume daily use. She will confirm which medication she is taking, likely loratadine.

## 2023-03-06 NOTE — Assessment & Plan Note (Addendum)
Chronic, notes from [facility] and [facility] were reviewed in full detail. She is currently being treated with Xgeva every 3months.

## 2023-03-06 NOTE — Assessment & Plan Note (Signed)
Chronic, Oncology input appreciated.

## 2023-03-06 NOTE — Assessment & Plan Note (Signed)
Her BMI is acceptable for her demographic. Encouraged to aim for at least 150 minutes of exercise per week

## 2023-03-07 ENCOUNTER — Other Ambulatory Visit: Payer: Self-pay

## 2023-03-07 MED ORDER — LETROZOLE 2.5 MG PO TABS: 2.5000 mg | ORAL_TABLET | Freq: Every day | ORAL | 1 refills | Status: DC

## 2023-03-12 DIAGNOSIS — Z9484 Stem cells transplant status: Secondary | ICD-10-CM | POA: Insufficient documentation

## 2023-03-12 NOTE — Assessment & Plan Note (Signed)
 She had her 2nd autologous stem cell transplant on 07/24/2014 at [facility]. However, her condition most recently improved s/p CAR-T therapy.

## 2023-03-20 ENCOUNTER — Ambulatory Visit: Payer: Medicare Other

## 2023-03-23 ENCOUNTER — Inpatient Hospital Stay: Payer: Medicare Other | Admitting: Hematology & Oncology

## 2023-03-23 ENCOUNTER — Inpatient Hospital Stay: Payer: Medicare Other

## 2023-03-23 ENCOUNTER — Other Ambulatory Visit: Payer: Self-pay | Admitting: *Deleted

## 2023-03-23 DIAGNOSIS — D500 Iron deficiency anemia secondary to blood loss (chronic): Secondary | ICD-10-CM

## 2023-03-23 DIAGNOSIS — E876 Hypokalemia: Secondary | ICD-10-CM

## 2023-03-23 DIAGNOSIS — R350 Frequency of micturition: Secondary | ICD-10-CM

## 2023-03-23 DIAGNOSIS — C9002 Multiple myeloma in relapse: Secondary | ICD-10-CM

## 2023-03-23 DIAGNOSIS — C9000 Multiple myeloma not having achieved remission: Secondary | ICD-10-CM

## 2023-03-23 DIAGNOSIS — N2889 Other specified disorders of kidney and ureter: Secondary | ICD-10-CM

## 2023-03-23 DIAGNOSIS — C9001 Multiple myeloma in remission: Secondary | ICD-10-CM

## 2023-03-23 DIAGNOSIS — Z95828 Presence of other vascular implants and grafts: Secondary | ICD-10-CM

## 2023-03-23 DIAGNOSIS — C541 Malignant neoplasm of endometrium: Secondary | ICD-10-CM

## 2023-03-26 ENCOUNTER — Ambulatory Visit (HOSPITAL_BASED_OUTPATIENT_CLINIC_OR_DEPARTMENT_OTHER)
Admission: RE | Admit: 2023-03-26 | Discharge: 2023-03-26 | Disposition: A | Discharge status: Home / Self Care | Payer: Medicare Other | Source: Ambulatory Visit | Attending: Medical Oncology | Admitting: Medical Oncology

## 2023-03-26 ENCOUNTER — Encounter: Payer: Self-pay | Admitting: Medical Oncology

## 2023-03-26 ENCOUNTER — Inpatient Hospital Stay (HOSPITAL_BASED_OUTPATIENT_CLINIC_OR_DEPARTMENT_OTHER): Payer: Medicare Other | Admitting: Medical Oncology

## 2023-03-26 ENCOUNTER — Inpatient Hospital Stay: Payer: Medicare Other

## 2023-03-26 ENCOUNTER — Encounter: Payer: Self-pay | Admitting: Hematology & Oncology

## 2023-03-26 ENCOUNTER — Inpatient Hospital Stay: Payer: Medicare Other | Attending: Hematology & Oncology

## 2023-03-26 VITALS — BP 130/67 | HR 73 | Temp 98.9°F | Resp 18 | Ht 64.0 in | Wt 172.8 lb

## 2023-03-26 DIAGNOSIS — C9001 Multiple myeloma in remission: Secondary | ICD-10-CM

## 2023-03-26 DIAGNOSIS — D500 Iron deficiency anemia secondary to blood loss (chronic): Secondary | ICD-10-CM

## 2023-03-26 DIAGNOSIS — Z95828 Presence of other vascular implants and grafts: Secondary | ICD-10-CM

## 2023-03-26 DIAGNOSIS — Z8542 Personal history of malignant neoplasm of other parts of uterus: Secondary | ICD-10-CM | POA: Diagnosis not present

## 2023-03-26 DIAGNOSIS — M7989 Other specified soft tissue disorders: Secondary | ICD-10-CM | POA: Insufficient documentation

## 2023-03-26 DIAGNOSIS — D509 Iron deficiency anemia, unspecified: Secondary | ICD-10-CM | POA: Diagnosis not present

## 2023-03-26 DIAGNOSIS — C9002 Multiple myeloma in relapse: Secondary | ICD-10-CM

## 2023-03-26 DIAGNOSIS — Z885 Allergy status to narcotic agent status: Secondary | ICD-10-CM | POA: Diagnosis not present

## 2023-03-26 DIAGNOSIS — R0602 Shortness of breath: Secondary | ICD-10-CM

## 2023-03-26 DIAGNOSIS — R350 Frequency of micturition: Secondary | ICD-10-CM

## 2023-03-26 DIAGNOSIS — C9000 Multiple myeloma not having achieved remission: Secondary | ICD-10-CM | POA: Diagnosis not present

## 2023-03-26 DIAGNOSIS — Z923 Personal history of irradiation: Secondary | ICD-10-CM | POA: Insufficient documentation

## 2023-03-26 DIAGNOSIS — Z9484 Stem cells transplant status: Secondary | ICD-10-CM | POA: Diagnosis not present

## 2023-03-26 DIAGNOSIS — C7982 Secondary malignant neoplasm of genital organs: Secondary | ICD-10-CM

## 2023-03-26 DIAGNOSIS — R059 Cough, unspecified: Secondary | ICD-10-CM | POA: Diagnosis not present

## 2023-03-26 DIAGNOSIS — C541 Malignant neoplasm of endometrium: Secondary | ICD-10-CM

## 2023-03-26 DIAGNOSIS — D709 Neutropenia, unspecified: Secondary | ICD-10-CM | POA: Insufficient documentation

## 2023-03-26 DIAGNOSIS — N2889 Other specified disorders of kidney and ureter: Secondary | ICD-10-CM

## 2023-03-26 DIAGNOSIS — E876 Hypokalemia: Secondary | ICD-10-CM

## 2023-03-26 DIAGNOSIS — Z79811 Long term (current) use of aromatase inhibitors: Secondary | ICD-10-CM | POA: Diagnosis not present

## 2023-03-26 DIAGNOSIS — R5081 Fever presenting with conditions classified elsewhere: Secondary | ICD-10-CM | POA: Diagnosis not present

## 2023-03-26 LAB — CMP (CANCER CENTER ONLY)
ALT: 17 U/L (ref 0–44)
AST: 12 U/L — ABNORMAL LOW (ref 15–41)
Albumin: 3.9 g/dL (ref 3.5–5.0)
Alkaline Phosphatase: 44 U/L (ref 38–126)
Anion gap: 8 (ref 5–15)
BUN: 14 mg/dL (ref 8–23)
CO2: 27 mmol/L (ref 22–32)
Calcium: 9.5 mg/dL (ref 8.9–10.3)
Chloride: 106 mmol/L (ref 98–111)
Creatinine: 0.8 mg/dL (ref 0.44–1.00)
GFR, Estimated: >60 mL/min (ref >60)
Glucose, Bld: 103 mg/dL — ABNORMAL HIGH (ref 70–99)
Potassium: 3 mmol/L — ABNORMAL LOW (ref 3.5–5.1)
Sodium: 141 mmol/L (ref 135–145)
Total Bilirubin: 0.4 mg/dL (ref 0.0–1.2)
Total Protein: 6.4 g/dL — ABNORMAL LOW (ref 6.5–8.1)

## 2023-03-26 LAB — CBC WITH DIFFERENTIAL (CANCER CENTER ONLY)
Abs Immature Granulocytes: 0.03 10*3/uL (ref 0.00–0.07)
Basophils Absolute: 0 10*3/uL (ref 0.0–0.1)
Basophils Relative: 1 %
Eosinophils Absolute: 0.1 10*3/uL (ref 0.0–0.5)
Eosinophils Relative: 3 %
HCT: 30.5 % — ABNORMAL LOW (ref 36.0–46.0)
Hemoglobin: 9.9 g/dL — ABNORMAL LOW (ref 12.0–15.0)
Immature Granulocytes: 1 %
Lymphocytes Relative: 22 %
Lymphs Abs: 0.8 10*3/uL (ref 0.7–4.0)
MCH: 26.4 pg (ref 26.0–34.0)
MCHC: 32.5 g/dL (ref 30.0–36.0)
MCV: 81.3 fL (ref 80.0–100.0)
Monocytes Absolute: 0.5 10*3/uL (ref 0.1–1.0)
Monocytes Relative: 14 %
Neutro Abs: 2.2 10*3/uL (ref 1.7–7.7)
Neutrophils Relative %: 59 %
Platelet Count: 169 10*3/uL (ref 150–400)
RBC: 3.75 MIL/uL — ABNORMAL LOW (ref 3.87–5.11)
RDW: 16.6 % — ABNORMAL HIGH (ref 11.5–15.5)
WBC Count: 3.6 10*3/uL — ABNORMAL LOW (ref 4.0–10.5)
nRBC: 0 % (ref 0.0–0.2)

## 2023-03-26 LAB — FERRITIN: Ferritin: 875 ng/mL — ABNORMAL HIGH (ref 11–307)

## 2023-03-26 LAB — IRON AND IRON BINDING CAPACITY (CC-WL,HP ONLY)
Iron: 60 ug/dL (ref 28–170)
Saturation Ratios: 21 % (ref 10.4–31.8)
TIBC: 286 ug/dL (ref 250–450)
UIBC: 226 ug/dL (ref 148–442)

## 2023-03-26 LAB — RETICULOCYTES
Immature Retic Fract: 15.1 % (ref 2.3–15.9)
RBC.: 3.73 MIL/uL — ABNORMAL LOW (ref 3.87–5.11)
Retic Count, Absolute: 41.4 10*3/uL (ref 19.0–186.0)
Retic Ct Pct: 1.1 % (ref 0.4–3.1)

## 2023-03-26 LAB — LACTATE DEHYDROGENASE: LDH: 170 U/L (ref 98–192)

## 2023-03-26 MED ORDER — HEPARIN SOD (PORK) LOCK FLUSH 100 UNIT/ML IV SOLN
500.0000 [IU] | Freq: Once | INTRAVENOUS | Status: AC
  Administered 2023-03-26: 500 [IU] via INTRAVENOUS

## 2023-03-26 MED ORDER — SODIUM CHLORIDE 0.9% FLUSH
10.0000 mL | Freq: Once | INTRAVENOUS | Status: AC
  Administered 2023-03-26: 10 mL via INTRAVENOUS

## 2023-03-26 MED ORDER — EPOETIN ALFA-EPBX 40000 UNIT/ML IJ SOLN
40000.0000 [IU] | Freq: Once | INTRAMUSCULAR | Status: AC
  Administered 2023-03-26: 40000 [IU] via SUBCUTANEOUS
  Filled 2023-03-26: qty 1

## 2023-03-26 NOTE — Patient Instructions (Signed)

## 2023-03-26 NOTE — Patient Instructions (Signed)

## 2023-03-26 NOTE — Progress Notes (Unsigned)
 She is Hematology and Oncology Follow Up Visit  Emily Greer [id number] [birth year] 73 y.o. 03/27/2023   Principle Diagnosis:  Recurrent lambda light chain myeloma - nl cytogenetics History of recurrent endometrial carcinoma  - NED since early 2017 Iron deficiency anemia -blood loss   Past Therapy:             Status post second autologous stem cell transplant on 07/24/2014 Maintenance therapy with Pomalidomide/every 2 week Velcade - d/c'ed Radiation therapy for endometrial recurrence - completed 04/20/2015 Pomalyst/Kyprolis 70mg /m2 IV q 2 weeks - s/p cycle #12 - held on 12/26/2017 for hematuria Daratumumab/Pomalyst (1 mg) - status post cycle 19 -- d/c on 08/21/2019 Melflufen 40 mg IV q 4 weeks -- started on 08/27/2019, s/p cycle #2 --  D/c due to FDA removal Selinexor 60 mg po q week -- start on 01/19/2020 -- changed on 03/18/2020 -- d/c on 04/27/2020 Blenrep 2.5 mg/m2 IV q 3 weeks -- started on 05/20/2020, s/p cycle #7 --D/C secondary to progression on 12/22/2020 Carfilzomib/Sarclisa -- s/p cycle #3 -- start on 01/03/2021 Cytoxan/Pomalyst/decadron -- start cycle #1 on 06/15/2021 - d/c 06/2021 S/p Cilta-cel CAR-T infusion -- 07/22/2021 Xgeva- D/c following 07/31/20224 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 We last saw her in September 2024. She is also seen by Dr. Raye Sorrow in [place] with her last visit being on 03/01/2023. She is also followed by GYN. They have d/c her Letrozole.   According to Dr. Marissa Calamity given her complete response to her CAR-T infusion she is now off of her Bactrim, IVIG treatments. It has been recommended that she continue her acyclovir at this time.   She is recovering from a mild viral illness but reports  no recurrent infections.   She has had no other issues.  She has had no change in bowel or bladder habits.  There is been no diarrhea.  She has had no rashes.  She had a little bit of leg swelling but this is chronic and stable.   Her last lambda light chain in the serum was less than 1.29 mg/dL.  Her last IgG level back in July was 998 mg/dL  She has had no problems with bleeding.  Her husband reports that she does have some trouble taking her medications as directed. She often misses supplements. She will work on this.   Her last iron studies that were done back in July showed a ferritin of 979 with an iron saturation of 18%.  Overall, I would say that her performance status for now is probably ECOG 1.  Wt Readings from Last 3 Encounters:  03/27/23 172 lb (78 kg)  03/26/23 172 lb 12.8 oz (78.4 kg)  03/05/23 175 lb 6.4 oz (79.6 kg)   Medications:  Allergies as of 03/26/2023       Reactions   Codeine Nausea Only        Medication List        Accurate as of March 26, 2023 11:59 PM. If you have any questions, ask your nurse or doctor.          acyclovir 400 MG tablet Commonly known as: ZOVIRAX Take 1 tablet (400 mg total) by mouth 2 (two)  times daily.   albuterol 108 (90 Base) MCG/ACT inhaler Commonly known as: VENTOLIN HFA Inhale 2 puffs into the lungs every 6 (six) hours as needed for wheezing or shortness of breath. 2 puffs 3 times daily x 5 days then every 6 hours as needed.   amLODipine 10 MG tablet Commonly known as: NORVASC TAKE 1 TABLET(10 MG) BY MOUTH EVERY MORNING What changed: additional instructions   aspirin EC 81 MG tablet Take 81 mg by mouth daily.   benzonatate 100 MG capsule Commonly known as: Tessalon Perles Take 1 capsule (100 mg total) by mouth 3 (three) times daily as needed for cough.   calcium-vitamin D 500-5 MG-MCG tablet Commonly known as: OSCAL WITH D Take 1 tablet by mouth daily.   chlorhexidine 0.12 % solution Commonly known  as: PERIDEX 10 mLs 2 (two) times daily.   letrozole 2.5 MG tablet Commonly known as: FEMARA Take 1 tablet (2.5 mg total) by mouth daily.   levocetirizine 5 MG tablet Commonly known as: Xyzal Allergy 24HR Take 1 tablet (5 mg total) by mouth every evening.   metoprolol tartrate 50 MG tablet Commonly known as: LOPRESSOR Take 1/2 tablet by mouth twice daily   potassium chloride SA 20 MEQ tablet Commonly known as: KLOR-CON M Take 1 tablet (20 mEq total) by mouth 2 (two) times daily.   triamcinolone 55 MCG/ACT Aero nasal inhaler Commonly known as: NASACORT Place 1 spray into the nose 2 (two) times daily.   Vitamin D3 50 MCG (2000 UT) Tabs Take 2 tablets by mouth daily.        Allergies:  Allergies  Allergen Reactions   Codeine Nausea Only    Past Medical History, Surgical history, Social history, and Family History were reviewed and updated.  Review of Systems: Review of Systems  Constitutional: Negative.   HENT: Negative.    Eyes:  Positive for blurred vision.  Respiratory: Negative.    Cardiovascular: Negative.   Gastrointestinal: Negative.   Genitourinary: Negative.   Musculoskeletal: Negative.   Skin: Negative.   Neurological: Negative.   Endo/Heme/Allergies: Negative.   Psychiatric/Behavioral: Negative.     Physical Exam:  height is 5\' 4"  (1.626 m) and weight is 172 lb 12.8 oz (78.4 kg). Her oral temperature is 98.9 F (37.2 C). Her blood pressure is 130/67 and her pulse is 73. Her respiration is 18 and oxygen saturation is 97%.   Wt Readings from Last 3 Encounters:  03/27/23 172 lb (78 kg)  03/26/23 172 lb 12.8 oz (78.4 kg)  03/05/23 175 lb 6.4 oz (79.6 kg)    Physical Exam Vitals reviewed.  HENT:     Head: Normocephalic and atraumatic.  Eyes:     Pupils: Pupils are equal, round, and reactive to light.  Cardiovascular:     Rate and Rhythm: Normal rate and regular rhythm.     Heart sounds: Normal heart sounds.  Pulmonary:     Effort: Pulmonary  effort is normal.     Breath sounds: Normal breath sounds.  Abdominal:     General: Bowel sounds are normal.     Palpations: Abdomen is soft.  Musculoskeletal:        General: No tenderness or deformity. Normal range of motion.     Cervical back: Normal range of motion.  Lymphadenopathy:     Cervical: No cervical adenopathy.  Skin:    General: Skin is warm and dry.     Findings: No erythema or rash.  Neurological:     Mental Status:  She is alert and oriented to person, place, and time.  Psychiatric:        Behavior: Behavior normal.        Thought Content: Thought content normal.        Judgment: Judgment normal.      Lab Results  Component Value Date   WBC 3.6 (L) 03/26/2023   HGB 9.9 (L) 03/26/2023   HCT 30.5 (L) 03/26/2023   MCV 81.3 03/26/2023   PLT 169 03/26/2023   Lab Results  Component Value Date   FERRITIN 875 (H) 03/26/2023   IRON 60 03/26/2023   TIBC 286 03/26/2023   UIBC 226 03/26/2023   IRONPCTSAT 21 03/26/2023   Lab Results  Component Value Date   RETICCTPCT 1.1 03/26/2023   RBC 3.73 (L) 03/26/2023   RBC 3.75 (L) 03/26/2023   Lab Results  Component Value Date   KPAFRELGTCHN 14.8 03/26/2023   LAMBDASER 18.3 03/26/2023   KAPLAMBRATIO 0.81 03/26/2023   Lab Results  Component Value Date   IGGSERUM 1,051 03/26/2023   IGA 16 (L) 03/26/2023   IGMSERUM 64 03/26/2023   Lab Results  Component Value Date   TOTALPROTELP 6.1 08/30/2022   ALBUMINELP 3.4 08/30/2022   A1GS 0.3 08/30/2022   A2GS 0.9 08/30/2022   BETS 1.0 08/30/2022   BETA2SER 0.4 11/23/2014   GAMS 0.6 08/30/2022   MSPIKE Not Observed 08/30/2022   SPEI Comment 10/19/2020     Chemistry      Component Value Date/Time   NA 141 03/26/2023 1100   NA 142 07/04/2022 1248   NA 141 01/10/2017 1115   NA 140 06/21/2016 0918   K 3.0 (L) 03/26/2023 1100   K 4.0 01/10/2017 1115   K 4.3 06/21/2016 0918   CL 106 03/26/2023 1100   CL 106 01/10/2017 1115   CO2 27 03/26/2023 1100   CO2 27  01/10/2017 1115   CO2 20 (L) 06/21/2016 0918   BUN 14 03/26/2023 1100   BUN 12 07/04/2022 1248   BUN 15 01/10/2017 1115   BUN 15.8 06/21/2016 0918   CREATININE 0.80 03/26/2023 1100   CREATININE 1.0 01/10/2017 1115   CREATININE 0.8 06/21/2016 0918      Component Value Date/Time   CALCIUM 9.5 03/26/2023 1100   CALCIUM 9.5 01/10/2017 1115   CALCIUM 9.4 06/21/2016 0918   ALKPHOS 44 03/26/2023 1100   ALKPHOS 40 01/10/2017 1115   ALKPHOS 66 06/21/2016 0918   AST 12 (L) 03/26/2023 1100   AST 17 06/21/2016 0918   ALT 17 03/26/2023 1100   ALT 19 01/10/2017 1115   ALT 37 06/21/2016 0918   BILITOT 0.4 03/26/2023 1100   BILITOT 0.32 06/21/2016 1610     Encounter Diagnoses  Name Primary?   Multiple myeloma, remission status unspecified (HCC) Yes   SOB (shortness of breath)    Cough, unspecified type    Port-A-Cath in place    Impression and Plan: Ms. Mincer is a very pleasant 73 yo African American female with recurrent lambda light chain myeloma.  She had her second stem cell transplant for light chain myeloma was in June 2016. She will need port flushes/labs every 6-8 weeks   CBC shows overall stability She will work on taking her medications/supplements as directed.  She will get her Retacrit today.  Chest x ray today RTC 2 months MD, port labs (CBC w/, CMP, LDH, ferritin, iron, retic, light chains, MM panel), injection  Rushie Chestnut, PA-C 2/25/20255:12 PM

## 2023-03-27 ENCOUNTER — Ambulatory Visit: Payer: Medicare Other

## 2023-03-27 ENCOUNTER — Ambulatory Visit (INDEPENDENT_AMBULATORY_CARE_PROVIDER_SITE_OTHER): Payer: Medicare Other

## 2023-03-27 ENCOUNTER — Encounter: Payer: Self-pay | Admitting: Hematology & Oncology

## 2023-03-27 VITALS — BP 124/80 | HR 77 | Temp 98.2°F | Ht 64.0 in | Wt 172.0 lb

## 2023-03-27 DIAGNOSIS — E538 Deficiency of other specified B group vitamins: Secondary | ICD-10-CM | POA: Diagnosis not present

## 2023-03-27 LAB — KAPPA/LAMBDA LIGHT CHAINS
Kappa free light chain: 14.8 mg/L (ref 3.3–19.4)
Kappa, lambda light chain ratio: 0.81 (ref 0.26–1.65)
Lambda free light chains: 18.3 mg/L (ref 5.7–26.3)

## 2023-03-27 LAB — IGG, IGA, IGM
IgA: 16 mg/dL — ABNORMAL LOW (ref 87–352)
IgG (Immunoglobin G), Serum: 1051 mg/dL (ref 586–1602)
IgM (Immunoglobulin M), Srm: 64 mg/dL (ref 26–217)

## 2023-03-27 MED ORDER — CYANOCOBALAMIN 1000 MCG/ML IJ SOLN
1000.0000 ug | Freq: Once | INTRAMUSCULAR | Status: AC
  Administered 2023-03-27: 1000 ug via INTRAMUSCULAR

## 2023-03-27 NOTE — Patient Instructions (Signed)
 Vitamin B12 Deficiency Vitamin B12 deficiency means that your body does not have enough vitamin B12. The body needs this important vitamin: To make red blood cells. To make genes (DNA). To help the nerves work. If you do not have enough vitamin B12 in your body, you can have health problems, such as not having enough red blood cells in the blood (anemia). What are the causes? Not eating enough foods that contain vitamin B12. Not being able to take in (absorb) vitamin B12 from the food that you eat. Certain diseases. A condition in which the body does not make enough of a certain protein. This results in your body not taking in enough vitamin B12. Having a surgery in which part of the stomach or small intestine is taken out. Taking medicines that make it hard for the body to take in vitamin B12. These include: Heartburn medicines. Some medicines that are used to treat diabetes. What increases the risk? Being an older adult. Eating a vegetarian or vegan diet that does not include any foods that come from animals. Not eating enough foods that contain vitamin B12 while you are pregnant. Taking certain medicines. Having alcoholism. What are the signs or symptoms? In some cases, there are no symptoms. If the condition leads to too few blood cells or nerve damage, symptoms can occur, such as: Feeling weak or tired. Not being hungry. Losing feeling (numbness) or tingling in your hands and feet. Redness and burning of the tongue. Feeling sad (depressed). Confusion or memory problems. Trouble walking. If anemia is very bad, symptoms can include: Being short of breath. Being dizzy. Having a very fast heartbeat. How is this treated? Changing the way you eat and drink, such as: Eating more foods that contain vitamin B12. Drinking little or no alcohol. Getting vitamin B12 shots. Taking vitamin B12 supplements by mouth (orally). Your doctor will tell you the dose that is best for you. Follow  these instructions at home: Eating and drinking  Eat foods that come from animals and have a lot of vitamin B12 in them. These include: Meats and poultry. This includes beef, pork, chicken, [place], and organ meats, such as liver. Seafood, such as clams, rainbow trout, salmon, tuna, and haddock. Eggs. Dairy foods such as milk, yogurt, and cheese. Eat breakfast cereals that have vitamin B12 added to them (are fortified). Check the label. The items listed above may not be a complete list of foods and beverages you can eat and drink. Contact a dietitian for more information. Alcohol use Do not drink alcohol if: Your doctor tells you not to drink. You are pregnant, may be pregnant, or are planning to become pregnant. If you drink alcohol: Limit how much you have to: 0-1 drink a day for women. 0-2 drinks a day for men. Know how much alcohol is in your drink. In the [place] one drink equals one 12 oz bottle of beer (355 mL), one 5 oz glass of wine (148 mL), or one 1 oz glass of hard liquor (44 mL). General instructions Get any vitamin B12 shots if told by your doctor. Take supplements only as told by your doctor. Follow the directions. Keep all follow-up visits. Contact a doctor if: Your symptoms come back. Your symptoms get worse or do not get better with treatment. Get help right away if: You have trouble breathing. You have a very fast heartbeat. You have chest pain. You get dizzy. You faint. These symptoms may be an emergency. Get help right away. Call 911.  Do not wait to see if the symptoms will go away. Do not drive yourself to the hospital. Summary Vitamin B12 deficiency means that your body is not getting enough of the vitamin. In some cases, there are no symptoms of this condition. Treatment may include making a change in the way you eat and drink, getting shots, or taking supplements. Eat foods that have vitamin B12 in them. This information is not intended to replace advice  given to you by your health care provider. Make sure you discuss any questions you have with your health care provider. Document Revised: 09/10/2020 Document Reviewed: 09/10/2020 Elsevier Patient Education  2024 [employer].

## 2023-03-27 NOTE — Progress Notes (Signed)
Patient presents today for a vitamin B12 injection YL,RMA 

## 2023-04-03 ENCOUNTER — Ambulatory Visit (INDEPENDENT_AMBULATORY_CARE_PROVIDER_SITE_OTHER): Payer: Medicare Other

## 2023-04-03 ENCOUNTER — Ambulatory Visit: Payer: Self-pay

## 2023-04-03 VITALS — BP 130/78 | HR 95 | Temp 98.6°F | Ht 64.0 in | Wt 172.0 lb

## 2023-04-03 DIAGNOSIS — E538 Deficiency of other specified B group vitamins: Secondary | ICD-10-CM

## 2023-04-03 MED ORDER — CYANOCOBALAMIN 1000 MCG/ML IJ SOLN
1000.0000 ug | Freq: Once | INTRAMUSCULAR | Status: AC
  Administered 2023-04-03: 1000 ug via INTRAMUSCULAR

## 2023-04-03 NOTE — Progress Notes (Signed)
 Patient presents today for 1st b12 shot. Next b12 shot is scheduled 2 weeks out.

## 2023-04-09 DIAGNOSIS — C9000 Multiple myeloma not having achieved remission: Secondary | ICD-10-CM | POA: Diagnosis not present

## 2023-04-09 DIAGNOSIS — M87180 Osteonecrosis due to drugs, jaw: Secondary | ICD-10-CM | POA: Diagnosis not present

## 2023-04-09 DIAGNOSIS — Z9481 Bone marrow transplant status: Secondary | ICD-10-CM | POA: Diagnosis not present

## 2023-04-10 ENCOUNTER — Encounter: Payer: Self-pay | Admitting: Dermatology

## 2023-04-10 ENCOUNTER — Ambulatory Visit (INDEPENDENT_AMBULATORY_CARE_PROVIDER_SITE_OTHER): Payer: Medicare Other | Admitting: Dermatology

## 2023-04-10 VITALS — BP 152/95

## 2023-04-10 DIAGNOSIS — L649 Androgenic alopecia, unspecified: Secondary | ICD-10-CM | POA: Diagnosis not present

## 2023-04-10 DIAGNOSIS — C9000 Multiple myeloma not having achieved remission: Secondary | ICD-10-CM

## 2023-04-10 DIAGNOSIS — L659 Nonscarring hair loss, unspecified: Secondary | ICD-10-CM | POA: Diagnosis not present

## 2023-04-10 NOTE — Patient Instructions (Addendum)

## 2023-04-10 NOTE — Progress Notes (Signed)
 New Patient Visit   Subjective  Emily Greer is a 73 y.o. female who presents for the following: New Pt - Hair Loss  The patient presents with concerns of hair thinning. She reports having always had shorter hair on top, but noticed more significant thinning after undergoing chemotherapy. The patient has a history of multiple myeloma, for which she received chemotherapy in 2000 and 2014, as well as CAR T-cell therapy approximately 18 months ago. She experienced complete hair loss twice, in 1999 and 2016, with regrowth occurring after the 2016 episode. However, she now reports increased thinning on the top of her head. The patient denies any symptoms of itching or tenderness associated with the hair loss. She has not used chemical relaxers in 15-16 years but typically wears her hair in a bun. The patient has not previously consulted a dermatologist for this issue or tried any over-the-counter treatments. She reports good iron levels but mentions a slightly low blood cell count, for which she receives infusions. The patient denies any kidney issues.   The following portions of the chart were reviewed this encounter and updated as appropriate: medications, allergies, medical history  Review of Systems:  No other skin or systemic complaints except as noted in HPI or Assessment and Plan.  Objective  Well appearing patient in no apparent distress; mood and affect are within normal limits.   A focused examination was performed of the following areas: scalp & hair   Relevant exam findings are noted in the Assessment and Plan.                Assessment & Plan   1. Hair thinning - Assessment: Patient presents with hair thinning, particularly on top, worsened after chemotherapy treatments for multiple myeloma in 2000, 2014, and CAR T-cell therapy 1.5 years ago. Complete hair loss occurred twice, in 1999 and 2016, with regrowth after 2016. Patient wears hair in a bun most of the time,  which may contribute to breakage. No previous dermatologist consultation or over-the-counter treatments. No symptoms of itching or tenderness. Chemical relaxers not used in 15-16 years. Differential diagnoses include central centrifugal cicatricial alopecia (CCCA), androgenetic alopecia, or other types of alopecia. Recent blood work shows good iron levels but slightly low blood cell count.  - Plan:    Perform scalp biopsy to determine type of alopecia    Follow-up appointment in 2 weeks for suture removal and biopsy results review    Patient education:      - Avoid washing hair for 24 hours post-biopsy     - No hair dye for 2 weeks     - Avoid scrubbing biopsy area    Develop treatment plan based on biopsy results at follow-up appointment  2. Multiple myeloma - Assessment: Patient has a history of multiple myeloma, diagnosed incidentally during pre-operative lab work for a hysterectomy. Symptoms included back and hip pain, initially attributed to arthritis. Patient has undergone multiple treatments, including chemotherapy in 2000 and 2014, two bone marrow transplants, and CAR T-cell therapy 1.5 years ago. The disease is currently controlled. Patient reports positive experience with immunotherapy, particularly Cardizem. Currently receiving infusions for multiple myeloma.  - Plan:    Continue current treatment regimen as managed by oncology    Monitor thyroid function and blood counts    Continue infusions as prescribed   ALOPECIA Mid Parietal Scalp Skin / nail biopsy - Mid Parietal Scalp Type of biopsy: punch   Informed consent: discussed and consent obtained  Timeout: patient name, date of birth, surgical site, and procedure verified   Procedure prep:  Patient was prepped and draped in usual sterile fashion Prep type:  Isopropyl alcohol Anesthesia: the lesion was anesthetized in a standard fashion   Anesthetic:  1% lidocaine w/ epinephrine 1-100,000 buffered w/ 8.4% NaHCO3 Punch size:   4 mm Suture size:  4-0 Suture type: Prolene (polypropylene)   Hemostasis achieved with: suture and aluminum chloride   Outcome: patient tolerated procedure well   Post-procedure details: sterile dressing applied and wound care instructions given   Dressing type: petrolatum gauze   Specimen 1 - Surgical pathology Differential Diagnosis: r/o CCCA vs androgenetic alopecia vs other  Check Margins: yes  No follow-ups on file.    Documentation: I have reviewed the above documentation for accuracy and completeness, and I agree with the above.  I, Shirron Marcha Solders, CMA, am acting as scribe for [employer], DO.   Langston Reusing, DO

## 2023-04-11 ENCOUNTER — Other Ambulatory Visit: Payer: Self-pay | Admitting: Hematology & Oncology

## 2023-04-11 DIAGNOSIS — E876 Hypokalemia: Secondary | ICD-10-CM

## 2023-04-12 LAB — SURGICAL PATHOLOGY

## 2023-04-16 ENCOUNTER — Encounter: Payer: Self-pay | Admitting: Hematology & Oncology

## 2023-04-16 ENCOUNTER — Inpatient Hospital Stay: Attending: Hematology & Oncology

## 2023-04-16 VITALS — BP 137/69 | HR 65 | Temp 98.4°F | Resp 18

## 2023-04-16 DIAGNOSIS — Z79899 Other long term (current) drug therapy: Secondary | ICD-10-CM | POA: Insufficient documentation

## 2023-04-16 DIAGNOSIS — H538 Other visual disturbances: Secondary | ICD-10-CM | POA: Insufficient documentation

## 2023-04-16 DIAGNOSIS — C9000 Multiple myeloma not having achieved remission: Secondary | ICD-10-CM | POA: Diagnosis not present

## 2023-04-16 DIAGNOSIS — C9001 Multiple myeloma in remission: Secondary | ICD-10-CM

## 2023-04-16 DIAGNOSIS — C541 Malignant neoplasm of endometrium: Secondary | ICD-10-CM

## 2023-04-16 MED ORDER — SODIUM CHLORIDE 0.9 % IV SOLN: Freq: Once | INTRAVENOUS | Status: AC

## 2023-04-16 MED ORDER — SODIUM CHLORIDE 0.9 % IV SOLN
510.0000 mg | Freq: Once | INTRAVENOUS | Status: AC
  Administered 2023-04-16: 510 mg via INTRAVENOUS
  Filled 2023-04-16: qty 17

## 2023-04-16 MED ORDER — EPOETIN ALFA-EPBX 40000 UNIT/ML IJ SOLN: 40000.0000 [IU] | Freq: Once | INTRAMUSCULAR | Status: DC

## 2023-04-16 MED ORDER — SODIUM CHLORIDE 0.9% FLUSH
10.0000 mL | INTRAVENOUS | Status: DC | PRN
  Administered 2023-04-16: 10 mL

## 2023-04-16 MED ORDER — HEPARIN SOD (PORK) LOCK FLUSH 100 UNIT/ML IV SOLN
500.0000 [IU] | Freq: Once | INTRAVENOUS | Status: AC | PRN
  Administered 2023-04-16: 500 [IU]

## 2023-04-16 NOTE — Patient Instructions (Signed)
 [facility] - A DEPT OF [facility]  Discharge Instructions: Thank you for choosing [facility] to provide your oncology and hematology care.   If you have a lab appointment with the Cancer Center, please go directly to the Cancer Center and check in at the registration area.  Wear comfortable clothing and clothing appropriate for easy access to any Portacath or PICC line.   We strive to give you quality time with your provider. You may need to reschedule your appointment if you arrive late (15 or more minutes).  Arriving late affects you and other patients whose appointments are after yours.  Also, if you miss three or more appointments without notifying the office, you may be dismissed from the clinic at the provider's discretion.      For prescription refill requests, have your pharmacy contact our office and allow 72 hours for refills to be completed.    Today you received the following Feraheme   To help prevent nausea and vomiting after your treatment, we encourage you to take your nausea medication as directed.  BELOW ARE SYMPTOMS THAT SHOULD BE REPORTED IMMEDIATELY: *FEVER GREATER THAN 100.4 F (38 C) OR HIGHER *CHILLS OR SWEATING *NAUSEA AND VOMITING THAT IS NOT CONTROLLED WITH YOUR NAUSEA MEDICATION *UNUSUAL SHORTNESS OF BREATH *UNUSUAL BRUISING OR BLEEDING *URINARY PROBLEMS (pain or burning when urinating, or frequent urination) *BOWEL PROBLEMS (unusual diarrhea, constipation, pain near the anus) TENDERNESS IN MOUTH AND THROAT WITH OR WITHOUT PRESENCE OF ULCERS (sore throat, sores in mouth, or a toothache) UNUSUAL RASH, SWELLING OR PAIN  UNUSUAL VAGINAL DISCHARGE OR ITCHING   Items with * indicate a potential emergency and should be followed up as soon as possible or go to the Emergency Department if any problems should occur.  Please show the CHEMOTHERAPY ALERT CARD or IMMUNOTHERAPY ALERT CARD at check-in to the Emergency  Department and triage nurse. Should you have questions after your visit or need to cancel or reschedule your appointment, please contact [facility] - A DEPT OF [facility]  [phone] and follow the prompts.  Office hours are 8:00 a.m. to 4:30 p.m. Monday - Friday. Please note that voicemails left after 4:00 p.m. may not be returned until the following business day.  We are closed weekends and major holidays. You have access to a nurse at all times for urgent questions. Please call the main number to the clinic [phone] and follow the prompts.  For any non-urgent questions, you may also contact your provider using MyChart. We now offer e-Visits for anyone 23 and older to request care online for non-urgent symptoms. For details visit [email or web address].   Also download the MyChart app! Go to the app store, search "MyChart", open the app, select , and log in with your MyChart username and password.

## 2023-04-17 ENCOUNTER — Ambulatory Visit (INDEPENDENT_AMBULATORY_CARE_PROVIDER_SITE_OTHER): Payer: Self-pay

## 2023-04-17 VITALS — BP 130/78 | Temp 98.2°F | Ht 64.0 in | Wt 172.0 lb

## 2023-04-17 DIAGNOSIS — E538 Deficiency of other specified B group vitamins: Secondary | ICD-10-CM | POA: Diagnosis not present

## 2023-04-17 MED ORDER — CYANOCOBALAMIN 1000 MCG/ML IJ SOLN
1000.0000 ug | Freq: Once | INTRAMUSCULAR | Status: AC
  Administered 2023-04-17: 1000 ug via INTRAMUSCULAR

## 2023-04-17 NOTE — Patient Instructions (Signed)
 Vitamin B12 Deficiency Vitamin B12 deficiency means that your body does not have enough vitamin B12. The body needs this important vitamin: To make red blood cells. To make genes (DNA). To help the nerves work. If you do not have enough vitamin B12 in your body, you can have health problems, such as not having enough red blood cells in the blood (anemia). What are the causes? Not eating enough foods that contain vitamin B12. Not being able to take in (absorb) vitamin B12 from the food that you eat. Certain diseases. A condition in which the body does not make enough of a certain protein. This results in your body not taking in enough vitamin B12. Having a surgery in which part of the stomach or small intestine is taken out. Taking medicines that make it hard for the body to take in vitamin B12. These include: Heartburn medicines. Some medicines that are used to treat diabetes. What increases the risk? Being an older adult. Eating a vegetarian or vegan diet that does not include any foods that come from animals. Not eating enough foods that contain vitamin B12 while you are pregnant. Taking certain medicines. Having alcoholism. What are the signs or symptoms? In some cases, there are no symptoms. If the condition leads to too few blood cells or nerve damage, symptoms can occur, such as: Feeling weak or tired. Not being hungry. Losing feeling (numbness) or tingling in your hands and feet. Redness and burning of the tongue. Feeling sad (depressed). Confusion or memory problems. Trouble walking. If anemia is very bad, symptoms can include: Being short of breath. Being dizzy. Having a very fast heartbeat. How is this treated? Changing the way you eat and drink, such as: Eating more foods that contain vitamin B12. Drinking little or no alcohol. Getting vitamin B12 shots. Taking vitamin B12 supplements by mouth (orally). Your doctor will tell you the dose that is best for you. Follow  these instructions at home: Eating and drinking  Eat foods that come from animals and have a lot of vitamin B12 in them. These include: Meats and poultry. This includes beef, pork, chicken, [place], and organ meats, such as liver. Seafood, such as clams, rainbow trout, salmon, tuna, and haddock. Eggs. Dairy foods such as milk, yogurt, and cheese. Eat breakfast cereals that have vitamin B12 added to them (are fortified). Check the label. The items listed above may not be a complete list of foods and beverages you can eat and drink. Contact a dietitian for more information. Alcohol use Do not drink alcohol if: Your doctor tells you not to drink. You are pregnant, may be pregnant, or are planning to become pregnant. If you drink alcohol: Limit how much you have to: 0-1 drink a day for women. 0-2 drinks a day for men. Know how much alcohol is in your drink. In the [place] one drink equals one 12 oz bottle of beer (355 mL), one 5 oz glass of wine (148 mL), or one 1 oz glass of hard liquor (44 mL). General instructions Get any vitamin B12 shots if told by your doctor. Take supplements only as told by your doctor. Follow the directions. Keep all follow-up visits. Contact a doctor if: Your symptoms come back. Your symptoms get worse or do not get better with treatment. Get help right away if: You have trouble breathing. You have a very fast heartbeat. You have chest pain. You get dizzy. You faint. These symptoms may be an emergency. Get help right away. Call 911.  Do not wait to see if the symptoms will go away. Do not drive yourself to the hospital. Summary Vitamin B12 deficiency means that your body is not getting enough of the vitamin. In some cases, there are no symptoms of this condition. Treatment may include making a change in the way you eat and drink, getting shots, or taking supplements. Eat foods that have vitamin B12 in them. This information is not intended to replace advice  given to you by your health care provider. Make sure you discuss any questions you have with your health care provider. Document Revised: 09/10/2020 Document Reviewed: 09/10/2020 Elsevier Patient Education  2024 [employer].

## 2023-04-17 NOTE — Progress Notes (Signed)
Patient presents today for her 2nd vitamin b12 injection. YL,RMA

## 2023-04-25 ENCOUNTER — Ambulatory Visit (INDEPENDENT_AMBULATORY_CARE_PROVIDER_SITE_OTHER): Admitting: Dermatology

## 2023-04-25 ENCOUNTER — Encounter: Payer: Self-pay | Admitting: Dermatology

## 2023-04-25 VITALS — BP 122/76 | HR 68

## 2023-04-25 DIAGNOSIS — Z79899 Other long term (current) drug therapy: Secondary | ICD-10-CM | POA: Diagnosis not present

## 2023-04-25 DIAGNOSIS — L649 Androgenic alopecia, unspecified: Secondary | ICD-10-CM

## 2023-04-25 DIAGNOSIS — E669 Obesity, unspecified: Secondary | ICD-10-CM | POA: Insufficient documentation

## 2023-04-25 MED ORDER — SAFETY SEAL MISCELLANEOUS MISC: 1.0000 | Freq: Every morning | 6 refills | Status: DC

## 2023-04-25 NOTE — Progress Notes (Unsigned)
   Follow-Up Visit   Subjective  Emily Greer is a 73 y.o. female who presents for the following: Discuss Bx results and treatment plan  Patient present today for follow up visit for BX Result and suture removal. Patient was last evaluated on 04/10/23. At this visit patient had punch bx completed. Bx showed Androgenetic Alopecia. Patient denies medication changes.  The following portions of the chart were reviewed this encounter and updated as appropriate: medications, allergies, medical history  Review of Systems:  No other skin or systemic complaints except as noted in HPI or Assessment and Plan.  Objective  Well appearing patient in no apparent distress; mood and affect are within normal limits.  A focused examination was performed of the following areas: Scalp  Relevant exam findings are noted in the Assessment and Plan.          Bx results: FINAL DIAGNOSIS and MICROSCOPIC DESCRIPTION Diagnosis Skin (A), mid parietal scalp NON-SCARRING ALOPECIA CONSISTENT WITH ANDROGENETIC (PATTERN) ALOPECIA Microscopic Description The total number of follicles is not significantly decreased but there is an increased proportion of miniaturized/vellus follicles. Sebaceous glands are preserved. No specific inflammatory reaction pattern is noted. A PAS stain is negative for fungal hyphae, and an elastic tissue stain shows no interfollicular scarring; controls stained appropriately. The findings are those of a non-scarring alopecia, consistent with androgenetic alopecia.   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  In women androgenetic alopecia is commonly associated with menopause but may occur any time after puberty.  It causes hair thinning  primarily on the crown with widening of the part and temporal hairline recession.  Can use OTC Rogaine (minoxidil) 5% solution/foam as directed.  Oral treatments in female patients who have no contraindication may include :  - Assessment: Biopsy results confirmed diagnosis of androgenetic alopecia, ruling out scarring alopecia. Condition is related to genetics and hormonal changes post-menopause. Patient's hair follicles have hormone receptors sensitive to androgens (testosterone and progesterone), which increase naturally after menopause, causing hair follicles to shrink progressively and eventually stop growing.   - Plan:    Prescribe topical solution containing minoxidil and finasteride      - Application instructions: Apply solution once daily in the morning to affected areas    Recommend supplements: Viviscal (2 tablets daily) and collagen    Prescription to be filled by [facility] compounding pharmacy    Patient informed of expected timeline: approximately 4 months to see visible results    Patient educated on the nature of the condition, its genetic and hormonal basis, and that while not curable, it is treatable    Follow-up to assess treatment efficacy (specific timeframe not mentioned)   Long term medication management.  Patient is using long term (months to years) prescription medication  to control their dermatologic condition.  These medications require periodic monitoring to evaluate for efficacy and side effects and may require periodic laboratory monitoring.      No follow-ups on file.  Documentation: I have reviewed the above documentation for accuracy and completeness, and I agree with the above.  Langston Reusing, DO

## 2023-04-25 NOTE — Patient Instructions (Addendum)
 Hello [place],  Thank you for visiting today. Here is a summary of the key instructions:  Diagnosis: Androgenetic Alopecia  - Medications:   - Apply prescription minoxidil solution once daily in the morning   - Put a few drops on affected areas and rub in   - Be careful not to let it run to other areas   - Use only in the morning to avoid transfer while sleeping  - Supplements:   - Take Viviscal vitamin supplement, 2 tablets daily   - Consider taking collagen supplement  - Treatment Plan:   - Expect to see results after about 4 months of consistent use   - [facility] pharmacy will call from a [place] number to verify address   - Medication will be mailed within 4 days  - Follow-up:   - Continue treatment as prescribed   - Contact the office if you have any questions or concerns  Please reach out if you have any questions or concerns.  Warm regards,  Dr. [facility] Reusing [facility]                Important Information   Due to recent changes in healthcare laws, you may see results of your pathology and/or laboratory studies on MyChart before the doctors have had a chance to review them. We understand that in some cases there may be results that are confusing or concerning to you. Please understand that not all results are received at the same time and often the doctors may need to interpret multiple results in order to provide you with the best plan of care or course of treatment. Therefore, we ask that you please give [place] 2 business days to thoroughly review all your results before contacting the office for clarification. Should we see a critical lab result, you will be contacted sooner.     If You Need Anything After Your Visit   If you have any questions or concerns for your doctor, please call our main line at [phone]. If no one answers, please leave a voicemail as directed and we will return your call as soon as possible. Messages left after 4 pm will be  answered the following business day.    You may also send [place] a message via MyChart. We typically respond to MyChart messages within 1-2 business days.  For prescription refills, please ask your pharmacy to contact our office. Our fax number is [phone].  If you have an urgent issue when the clinic is closed that cannot wait until the next business day, you can page your doctor at the number below.     Please note that while we do our best to be available for urgent issues outside of office hours, we are not available 24/7.    If you have an urgent issue and are unable to reach [place], you may choose to seek medical care at your doctor's office, retail clinic, urgent care center, or emergency room.   If you have a medical emergency, please immediately call 911 or go to the emergency department. In the event of inclement weather, please call our main line at [phone] for an update on the status of any delays or closures.  Dermatology Medication Tips: Please keep the boxes that topical medications come in in order to help keep track of the instructions about where and how to use these. Pharmacies typically print the medication instructions only on the boxes and not directly on the medication tubes.   If your  medication is too expensive, please contact our office at [phone] or send [place] a message through MyChart.    We are unable to tell what your co-pay for medications will be in advance as this is different depending on your insurance coverage. However, we may be able to find a substitute medication at lower cost or fill out paperwork to get insurance to cover a needed medication.    If a prior authorization is required to get your medication covered by your insurance company, please allow [place] 1-2 business days to complete this process.   Drug prices often vary depending on where the prescription is filled and some pharmacies may offer cheaper prices.   The website www.goodrx.com  contains coupons for medications through different pharmacies. The prices here do not account for what the cost may be with help from insurance (it may be cheaper with your insurance), but the website can give you the price if you did not use any insurance.  - You can print the associated coupon and take it with your prescription to the pharmacy.  - You may also stop by our office during regular business hours and pick up a GoodRx coupon card.  - If you need your prescription sent electronically to a different pharmacy, notify our office through [facility] or by phone at [phone]

## 2023-04-26 ENCOUNTER — Encounter: Payer: Self-pay | Admitting: Dermatology

## 2023-05-01 ENCOUNTER — Ambulatory Visit (INDEPENDENT_AMBULATORY_CARE_PROVIDER_SITE_OTHER)

## 2023-05-01 VITALS — BP 130/82 | Temp 98.1°F | Ht 64.0 in | Wt 172.0 lb

## 2023-05-01 DIAGNOSIS — E538 Deficiency of other specified B group vitamins: Secondary | ICD-10-CM | POA: Diagnosis not present

## 2023-05-01 MED ORDER — CYANOCOBALAMIN 1000 MCG/ML IJ SOLN
1000.0000 ug | Freq: Once | INTRAMUSCULAR | Status: AC
  Administered 2023-05-01: 1000 ug via INTRAMUSCULAR

## 2023-05-01 NOTE — Patient Instructions (Signed)
 Vitamin B12 Deficiency Vitamin B12 deficiency means that your body does not have enough vitamin B12. The body needs this important vitamin: To make red blood cells. To make genes (DNA). To help the nerves work. If you do not have enough vitamin B12 in your body, you can have health problems, such as not having enough red blood cells in the blood (anemia). What are the causes? Not eating enough foods that contain vitamin B12. Not being able to take in (absorb) vitamin B12 from the food that you eat. Certain diseases. A condition in which the body does not make enough of a certain protein. This results in your body not taking in enough vitamin B12. Having a surgery in which part of the stomach or small intestine is taken out. Taking medicines that make it hard for the body to take in vitamin B12. These include: Heartburn medicines. Some medicines that are used to treat diabetes. What increases the risk? Being an older adult. Eating a vegetarian or vegan diet that does not include any foods that come from animals. Not eating enough foods that contain vitamin B12 while you are pregnant. Taking certain medicines. Having alcoholism. What are the signs or symptoms? In some cases, there are no symptoms. If the condition leads to too few blood cells or nerve damage, symptoms can occur, such as: Feeling weak or tired. Not being hungry. Losing feeling (numbness) or tingling in your hands and feet. Redness and burning of the tongue. Feeling sad (depressed). Confusion or memory problems. Trouble walking. If anemia is very bad, symptoms can include: Being short of breath. Being dizzy. Having a very fast heartbeat. How is this treated? Changing the way you eat and drink, such as: Eating more foods that contain vitamin B12. Drinking little or no alcohol. Getting vitamin B12 shots. Taking vitamin B12 supplements by mouth (orally). Your doctor will tell you the dose that is best for you. Follow  these instructions at home: Eating and drinking  Eat foods that come from animals and have a lot of vitamin B12 in them. These include: Meats and poultry. This includes beef, pork, chicken, [place], and organ meats, such as liver. Seafood, such as clams, rainbow trout, salmon, tuna, and haddock. Eggs. Dairy foods such as milk, yogurt, and cheese. Eat breakfast cereals that have vitamin B12 added to them (are fortified). Check the label. The items listed above may not be a complete list of foods and beverages you can eat and drink. Contact a dietitian for more information. Alcohol use Do not drink alcohol if: Your doctor tells you not to drink. You are pregnant, may be pregnant, or are planning to become pregnant. If you drink alcohol: Limit how much you have to: 0-1 drink a day for women. 0-2 drinks a day for men. Know how much alcohol is in your drink. In the [place] one drink equals one 12 oz bottle of beer (355 mL), one 5 oz glass of wine (148 mL), or one 1 oz glass of hard liquor (44 mL). General instructions Get any vitamin B12 shots if told by your doctor. Take supplements only as told by your doctor. Follow the directions. Keep all follow-up visits. Contact a doctor if: Your symptoms come back. Your symptoms get worse or do not get better with treatment. Get help right away if: You have trouble breathing. You have a very fast heartbeat. You have chest pain. You get dizzy. You faint. These symptoms may be an emergency. Get help right away. Call 911.  Do not wait to see if the symptoms will go away. Do not drive yourself to the hospital. Summary Vitamin B12 deficiency means that your body is not getting enough of the vitamin. In some cases, there are no symptoms of this condition. Treatment may include making a change in the way you eat and drink, getting shots, or taking supplements. Eat foods that have vitamin B12 in them. This information is not intended to replace advice  given to you by your health care provider. Make sure you discuss any questions you have with your health care provider. Document Revised: 09/10/2020 Document Reviewed: 09/10/2020 Elsevier Patient Education  2024 [employer].

## 2023-05-01 NOTE — Progress Notes (Signed)
Patient presents today for monthly b12 injection.  

## 2023-05-21 ENCOUNTER — Other Ambulatory Visit: Payer: Self-pay | Admitting: Internal Medicine

## 2023-05-24 ENCOUNTER — Inpatient Hospital Stay: Payer: Medicare Other

## 2023-05-24 ENCOUNTER — Inpatient Hospital Stay: Payer: Medicare Other | Attending: Hematology & Oncology

## 2023-05-24 ENCOUNTER — Encounter: Payer: Self-pay | Admitting: Hematology & Oncology

## 2023-05-24 ENCOUNTER — Other Ambulatory Visit: Payer: Self-pay

## 2023-05-24 ENCOUNTER — Other Ambulatory Visit: Payer: Self-pay | Admitting: Medical Oncology

## 2023-05-24 ENCOUNTER — Inpatient Hospital Stay (HOSPITAL_BASED_OUTPATIENT_CLINIC_OR_DEPARTMENT_OTHER): Payer: Medicare Other | Admitting: Hematology & Oncology

## 2023-05-24 VITALS — BP 158/68 | HR 68

## 2023-05-24 DIAGNOSIS — R58 Hemorrhage, not elsewhere classified: Secondary | ICD-10-CM | POA: Insufficient documentation

## 2023-05-24 DIAGNOSIS — D500 Iron deficiency anemia secondary to blood loss (chronic): Secondary | ICD-10-CM | POA: Diagnosis not present

## 2023-05-24 DIAGNOSIS — C9000 Multiple myeloma not having achieved remission: Secondary | ICD-10-CM

## 2023-05-24 DIAGNOSIS — C9001 Multiple myeloma in remission: Secondary | ICD-10-CM

## 2023-05-24 DIAGNOSIS — C541 Malignant neoplasm of endometrium: Secondary | ICD-10-CM | POA: Diagnosis not present

## 2023-05-24 DIAGNOSIS — H538 Other visual disturbances: Secondary | ICD-10-CM | POA: Insufficient documentation

## 2023-05-24 DIAGNOSIS — Z79899 Other long term (current) drug therapy: Secondary | ICD-10-CM | POA: Diagnosis not present

## 2023-05-24 DIAGNOSIS — Z885 Allergy status to narcotic agent status: Secondary | ICD-10-CM | POA: Diagnosis not present

## 2023-05-24 DIAGNOSIS — Z9484 Stem cells transplant status: Secondary | ICD-10-CM | POA: Diagnosis not present

## 2023-05-24 DIAGNOSIS — Z79811 Long term (current) use of aromatase inhibitors: Secondary | ICD-10-CM | POA: Insufficient documentation

## 2023-05-24 DIAGNOSIS — Z923 Personal history of irradiation: Secondary | ICD-10-CM | POA: Insufficient documentation

## 2023-05-24 LAB — CMP (CANCER CENTER ONLY)
ALT: 15 U/L (ref 0–44)
AST: 15 U/L (ref 15–41)
Albumin: 4 g/dL (ref 3.5–5.0)
Alkaline Phosphatase: 46 U/L (ref 38–126)
Anion gap: 9 (ref 5–15)
BUN: 16 mg/dL (ref 8–23)
CO2: 29 mmol/L (ref 22–32)
Calcium: 9.7 mg/dL (ref 8.9–10.3)
Chloride: 103 mmol/L (ref 98–111)
Creatinine: 1.08 mg/dL — ABNORMAL HIGH (ref 0.44–1.00)
GFR, Estimated: 55 mL/min — ABNORMAL LOW (ref >60)
Glucose, Bld: 110 mg/dL — ABNORMAL HIGH (ref 70–99)
Potassium: 2.8 mmol/L — ABNORMAL LOW (ref 3.5–5.1)
Sodium: 141 mmol/L (ref 135–145)
Total Bilirubin: 0.2 mg/dL (ref 0.0–1.2)
Total Protein: 6.8 g/dL (ref 6.5–8.1)

## 2023-05-24 LAB — RETIC PANEL
Immature Retic Fract: 16.4 % — ABNORMAL HIGH (ref 2.3–15.9)
RBC.: 4.04 MIL/uL (ref 3.87–5.11)
Retic Count, Absolute: 61.8 10*3/uL (ref 19.0–186.0)
Retic Ct Pct: 1.5 % (ref 0.4–3.1)
Reticulocyte Hemoglobin: 30.3 pg (ref >27.9)

## 2023-05-24 LAB — CBC WITH DIFFERENTIAL (CANCER CENTER ONLY)
Abs Immature Granulocytes: 0.04 10*3/uL (ref 0.00–0.07)
Basophils Absolute: 0.1 10*3/uL (ref 0.0–0.1)
Basophils Relative: 1 %
Eosinophils Absolute: 0.1 10*3/uL (ref 0.0–0.5)
Eosinophils Relative: 3 %
HCT: 33.5 % — ABNORMAL LOW (ref 36.0–46.0)
Hemoglobin: 10.8 g/dL — ABNORMAL LOW (ref 12.0–15.0)
Immature Granulocytes: 1 %
Lymphocytes Relative: 22 %
Lymphs Abs: 0.9 10*3/uL (ref 0.7–4.0)
MCH: 26.3 pg (ref 26.0–34.0)
MCHC: 32.2 g/dL (ref 30.0–36.0)
MCV: 81.7 fL (ref 80.0–100.0)
Monocytes Absolute: 0.5 10*3/uL (ref 0.1–1.0)
Monocytes Relative: 12 %
Neutro Abs: 2.4 10*3/uL (ref 1.7–7.7)
Neutrophils Relative %: 61 %
Platelet Count: 172 10*3/uL (ref 150–400)
RBC: 4.1 MIL/uL (ref 3.87–5.11)
RDW: 17.7 % — ABNORMAL HIGH (ref 11.5–15.5)
WBC Count: 4 10*3/uL (ref 4.0–10.5)
nRBC: 0 % (ref 0.0–0.2)

## 2023-05-24 LAB — IRON AND IRON BINDING CAPACITY (CC-WL,HP ONLY)
Iron: 77 ug/dL (ref 28–170)
Saturation Ratios: 26 % (ref 10.4–31.8)
TIBC: 298 ug/dL (ref 250–450)
UIBC: 221 ug/dL (ref 148–442)

## 2023-05-24 LAB — FERRITIN: Ferritin: 1460 ng/mL — ABNORMAL HIGH (ref 11–307)

## 2023-05-24 LAB — LACTATE DEHYDROGENASE: LDH: 179 U/L (ref 98–192)

## 2023-05-24 MED ORDER — SODIUM CHLORIDE 0.9% FLUSH
10.0000 mL | INTRAVENOUS | Status: DC | PRN
  Administered 2023-05-24: 10 mL via INTRAVENOUS

## 2023-05-24 MED ORDER — HEPARIN SOD (PORK) LOCK FLUSH 100 UNIT/ML IV SOLN: 500.0000 [IU] | Freq: Once | INTRAVENOUS | Status: DC

## 2023-05-24 MED ORDER — POTASSIUM CHLORIDE CRYS ER 20 MEQ PO TBCR
40.0000 meq | EXTENDED_RELEASE_TABLET | Freq: Once | ORAL | Status: AC
  Administered 2023-05-24: 40 meq via ORAL
  Filled 2023-05-24: qty 2

## 2023-05-24 MED ORDER — ALTEPLASE 2 MG IJ SOLR
2.0000 mg | Freq: Once | INTRAMUSCULAR | Status: DC | PRN
Start: 2023-05-24 — End: 2023-05-24

## 2023-05-24 MED ORDER — EPOETIN ALFA-EPBX 40000 UNIT/ML IJ SOLN
40000.0000 [IU] | Freq: Once | INTRAMUSCULAR | Status: AC
  Administered 2023-05-24: 40000 [IU] via SUBCUTANEOUS
  Filled 2023-05-24: qty 1

## 2023-05-24 MED ORDER — LETROZOLE 2.5 MG PO TABS: 2.5000 mg | ORAL_TABLET | Freq: Every day | ORAL | 12 refills | Status: AC

## 2023-05-24 NOTE — Patient Instructions (Signed)
 Darbepoetin Alfa Injection What is this medication? DARBEPOETIN ALFA (dar be POE e tin AL fa) treats low levels of red blood cells (anemia) caused by kidney disease or chemotherapy. It works by Systems analyst make more red blood cells, which reduces the need for blood transfusions. This medicine may be used for other purposes; ask your health care provider or pharmacist if you have questions. COMMON BRAND NAME(S): Aranesp What should I tell my care team before I take this medication? They need to know if you have any of these conditions: Blood clots Cancer Heart disease High blood pressure On dialysis Seizures Stroke An unusual or allergic reaction to darbepoetin, latex, other medications, foods, dyes, or preservatives Pregnant or trying to get pregnant Breast-feeding How should I use this medication? This medication is injected into a vein or under the skin. It is usually given by a care team in a hospital or clinic setting. It may also be given at home. If you get this medication at home, you will be taught how to prepare and give it. Use exactly as directed. Take it as directed on the prescription label at the same time every day. Keep taking it unless your care team tells you to stop. It is important that you put your used needles and syringes in a special sharps container. Do not put them in a trash can. If you do not have a sharps container, call your pharmacist or care team to get one. A special MedGuide will be given to you by the pharmacist with each prescription and refill. Be sure to read this information carefully each time. Talk to your care team about the use of this medication in children. While this medication may be used in children as young as 1 month of age for selected conditions, precautions do apply. Overdosage: If you think you have taken too much of this medicine contact a poison control center or emergency room at once. NOTE: This medicine is only for you. Do not  share this medicine with others. What if I miss a dose? If you miss a dose, take it as soon as you can. If it is almost time for your next dose, take only that dose. Do not take double or extra doses. What may interact with this medication? Epoetin  alfa Methoxy polyethylene glycol-epoetin  beta This list may not describe all possible interactions. Give your health care provider a list of all the medicines, herbs, non-prescription drugs, or dietary supplements you use. Also tell them if you smoke, drink alcohol, or use illegal drugs. Some items may interact with your medicine. What should I watch for while using this medication? Visit your care team for regular checks on your progress. Check your blood pressure as directed. Know what your blood pressure should be and when to contact your care team. Your condition will be monitored carefully while you are receiving this medication. You may need blood work while taking this medication. What side effects may I notice from receiving this medication? Side effects that you should report to your care team as soon as possible: Allergic reactions--skin rash, itching, hives, swelling of the face, lips, tongue, or throat Blood clot--pain, swelling, or warmth in the leg, shortness of breath, chest pain Heart attack--pain or tightness in the chest, shoulders, arms, or jaw, nausea, shortness of breath, cold or clammy skin, feeling faint or lightheaded Increase in blood pressure Rash, fever, and swollen lymph nodes Redness, blistering, peeling, or loosening of the skin, including inside the mouth Seizures  Stroke--sudden numbness or weakness of the face, arm, or leg, trouble speaking, confusion, trouble walking, loss of balance or coordination, dizziness, severe headache, change in vision Side effects that usually do not require medical attention (report to your care team if they continue or are bothersome): Cough Stomach pain Swelling of the ankles, hands, or  feet This list may not describe all possible side effects. Call your doctor for medical advice about side effects. You may report side effects to FDA at 1-800-FDA-1088. Where should I keep my medication? Keep out of the reach of children and pets. Store in a refrigerator. Do not freeze. Do not shake. Protect from light. Keep this medication in the original container until you are ready to take it. See product for storage information. Get rid of any unused medication after the expiration date. To get rid of medications that are no longer needed or have expired: Take the medication to a medication take-back program. Check with your pharmacy or law enforcement to find a location. If you cannot return the medication, ask your pharmacist or care team how to get rid of the medication safely. NOTE: This sheet is a summary. It may not cover all possible information. If you have questions about this medicine, talk to your doctor, pharmacist, or health care provider.  2024 Elsevier/Gold Standard (2021-05-18 00:00:00)Hypokalemia Hypokalemia means that the amount of potassium in the blood is lower than normal. Potassium is a mineral (electrolyte) that helps regulate the amount of fluid in the body. It also stimulates muscle tightening (contraction) and helps nerves work properly. Normally, most of the body's potassium is inside cells, and only a very small amount is in the blood. Because the amount in the blood is so small, minor changes to potassium levels in the blood can be life-threatening. What are the causes? This condition may be caused by: Antibiotic medicine. Diarrhea or vomiting. Taking too much of a medicine that helps you have a bowel movement (laxative) can cause diarrhea and lead to hypokalemia. Chronic kidney disease (CKD). Medicines that help the body get rid of excess fluid (diuretics). Eating disorders, such as anorexia or bulimia. Low magnesium  levels in the body. Sweating a lot. What  are the signs or symptoms? Symptoms of this condition include: Weakness. Constipation. Fatigue. Muscle cramps. Mental confusion. Skipped heartbeats or irregular heartbeat (palpitations). Tingling or numbness. How is this diagnosed? This condition is diagnosed with a blood test. How is this treated? This condition may be treated by: Taking potassium supplements. Adjusting the medicines that you take. Eating more foods that contain a lot of potassium. If your potassium level is very low, you may need to get potassium through an IV and be monitored in the hospital. Follow these instructions at home: Eating and drinking  Eat a healthy diet. A healthy diet includes fresh fruits and vegetables, whole grains, healthy fats, and lean proteins. If told, eat more foods that contain a lot of potassium. These include: Nuts, such as peanuts and pistachios. Seeds, such as sunflower seeds and pumpkin seeds. Peas, lentils, and lima beans. Whole grain and bran cereals and breads. Fresh fruits and vegetables, such as apricots, avocado, bananas, cantaloupe, kiwi, oranges, tomatoes, asparagus, and potatoes. Juices, such as orange, tomato, and prune. Lean meats, including fish. Milk and milk products, such as yogurt. General instructions Take over-the-counter and prescription medicines only as told by your health care provider. This includes vitamins, natural food products, and supplements. Keep all follow-up visits. This is important. Contact a health care provider  if: You have weakness that gets worse. You feel your heart pounding or racing. You vomit. You have diarrhea. You have diabetes and you have trouble keeping your blood sugar in your target range. Get help right away if: You have chest pain. You have shortness of breath. You have vomiting or diarrhea that lasts for more than 2 days. You faint. These symptoms may be an emergency. Get help right away. Call 911. Do not wait to see if  the symptoms will go away. Do not drive yourself to the hospital. Summary Hypokalemia means that the amount of potassium in the blood is lower than normal. This condition is diagnosed with a blood test. Hypokalemia may be treated by taking potassium supplements, adjusting the medicines that you take, or eating more foods that are high in potassium. If your potassium level is very low, you may need to get potassium through an IV and be monitored in the hospital. This information is not intended to replace advice given to you by your health care provider. Make sure you discuss any questions you have with your health care provider. Document Revised: 09/30/2020 Document Reviewed: 09/30/2020 Elsevier Patient Education  2024 [employer].

## 2023-05-24 NOTE — Progress Notes (Signed)
 She is Hematology and Oncology Follow Up Visit  Emily Greer [id number] 04/06/1950 73 y.o. 05/24/2023   Principle Diagnosis:  Recurrent lambda light chain myeloma - nl cytogenetics History of recurrent endometrial carcinoma  - NED since early 2017 Iron deficiency anemia -blood loss   Past Therapy:             Status post second autologous stem cell transplant on 07/24/2014 Maintenance therapy with Pomalidomide [name] 2 week Velcade  - d/c'ed Radiation therapy for endometrial recurrence - completed 04/20/2015 Pomalyst /Kyprolis  70mg /m2 IV q 2 weeks - s/p cycle #12 - held on 12/26/2017 for hematuria Daratumumab /Pomalyst  (1 mg) - status post cycle 19 -- d/c on 08/21/2019 Melflufen 40 mg IV q 4 weeks -- started on 08/27/2019, s/p cycle #2 --  D/c due to FDA removal Selinexor  60 mg po q week -- start on 01/19/2020 -- changed on 03/18/2020 -- d/c on 04/27/2020 Blenrep  2.5 mg/m2 IV q 3 weeks -- started on 05/20/2020, s/p cycle #7 --D/C secondary to progression on 12/22/2020 Carfilzomib /Sarclisa  -- s/p cycle #3 -- start on 01/03/2021 Cytoxan /Pomalyst /decadron  -- start cycle #1 on 06/15/2021 - d/c 06/2021 S/p Cilta-cel CAR-T infusion -- 07/22/2021 Xgeva - D/c following 07/31/20224 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 is here today for follow-up.  So far, everything is going quite well for her.  She really has no complaints.  She had a very nice Easter holiday.  Her family came in to visit.  It is nice that her 2 grandsons are now living in [place].  She has had no problem with infections.  She has had no problems with cough or shortness of breath.  She continues on letrozole  for the endometrial carcinoma.  She is done well with this.  When we last saw  her, the measured light chain was 1.8 mg/dL.  She has had no issues with bleeding.  There is been no leg swelling.  She has had no rashes.  Thankfully, she has not not had any problems with COVID or influenza.  Overall, I would say that her performance status is probably ECOG 1.     Wt Readings from Last 3 Encounters:  05/01/23 172 lb (78 kg)  04/17/23 172 lb (78 kg)  04/03/23 172 lb (78 kg)   Medications:  Allergies as of 05/24/2023       Reactions   Codeine Nausea Only        Medication List        Accurate as of May 24, 2023  9:14 AM. If you have any questions, ask your nurse or doctor.          acyclovir  400 MG tablet Commonly known as: ZOVIRAX  Take 1 tablet (400 mg total) by mouth 2 (two) times daily.   albuterol  108 (90 Base) MCG/ACT inhaler Commonly known as: VENTOLIN  HFA Inhale 2 puffs into the lungs every 6 (six) hours as needed for wheezing or shortness of breath. 2 puffs 3 times daily x 5 days then every 6 hours as needed.   amLODipine  10 MG tablet Commonly known as: NORVASC  TAKE 1 TABLET(10 MG) BY MOUTH EVERY MORNING What changed: additional instructions   aspirin EC 81 MG tablet Take 81 mg by mouth daily.  benzonatate  100 MG capsule Commonly known as: Tessalon  Perles Take 1 capsule (100 mg total) by mouth 3 (three) times daily as needed for cough.   calcium-vitamin D  500-5 MG-MCG tablet Commonly known as: OSCAL WITH D Take 1 tablet by mouth daily.   chlorhexidine 0.12 % solution Commonly known as: PERIDEX 10 mLs 2 (two) times daily.   letrozole  2.5 MG tablet Commonly known as: FEMARA  Take 1 tablet (2.5 mg total) by mouth daily.   levocetirizine 5 MG tablet Commonly known as: Xyzal  Allergy 24HR Take 1 tablet (5 mg total) by mouth every evening.   metoprolol  tartrate 50 MG tablet Commonly known as: LOPRESSOR  Take 1/2 tablet by mouth twice daily   potassium chloride  SA 20 MEQ tablet Commonly known as: KLOR-CON  M TAKE 1 TABLET(20  MEQ) BY MOUTH TWICE DAILY   Safety Seal Miscellaneous Misc Apply 1 Application topically in the morning. Medication Name: Hormonic Hair Solution (Mioxidil 7%, Finasteride 0.05%)   triamcinolone  55 MCG/ACT Aero nasal inhaler Commonly known as: NASACORT  Place 1 spray into the nose 2 (two) times daily.   Vitamin D3 50 MCG (2000 UT) Tabs Take 2 tablets by mouth daily.        Allergies:  Allergies  Allergen Reactions   Codeine Nausea Only    Past Medical History, Surgical history, Social history, and Family History were reviewed and updated.  Review of Systems: Review of Systems  Constitutional: Negative.   HENT: Negative.    Eyes:  Positive for blurred vision.  Respiratory: Negative.    Cardiovascular: Negative.   Gastrointestinal: Negative.   Genitourinary: Negative.   Musculoskeletal: Negative.   Skin: Negative.   Neurological: Negative.   Endo/Heme/Allergies: Negative.   Psychiatric/Behavioral: Negative.     Physical Exam:  Her vital signs are temperature of 98.  Pulse 82.  Blood pressure 130/82.  Weight is 172  lbs Wt Readings from Last 3 Encounters:  05/01/23 172 lb (78 kg)  04/17/23 172 lb (78 kg)  04/03/23 172 lb (78 kg)    Physical Exam Vitals reviewed.  HENT:     Head: Normocephalic and atraumatic.  Eyes:     Pupils: Pupils are equal, round, and reactive to light.  Cardiovascular:     Rate and Rhythm: Normal rate and regular rhythm.     Heart sounds: Normal heart sounds.  Pulmonary:     Effort: Pulmonary effort is normal.     Breath sounds: Normal breath sounds.  Abdominal:     General: Bowel sounds are normal.     Palpations: Abdomen is soft.  Musculoskeletal:        General: No tenderness or deformity. Normal range of motion.     Cervical back: Normal range of motion.  Lymphadenopathy:     Cervical: No cervical adenopathy.  Skin:    General: Skin is warm and dry.     Findings: No erythema or rash.  Neurological:     Mental Status: She is  alert and oriented to person, place, and time.  Psychiatric:        Behavior: Behavior normal.        Thought Content: Thought content normal.        Judgment: Judgment normal.      Lab Results  Component Value Date   WBC 4.0 05/24/2023   HGB 10.8 (L) 05/24/2023   HCT 33.5 (L) 05/24/2023   MCV 81.7 05/24/2023   PLT 172 05/24/2023   Lab Results  Component Value Date   FERRITIN 875 (  H) 03/26/2023   IRON 60 03/26/2023   TIBC 286 03/26/2023   UIBC 226 03/26/2023   IRONPCTSAT 21 03/26/2023   Lab Results  Component Value Date   RETICCTPCT 1.5 05/24/2023   RBC 4.10 05/24/2023   RBC 4.04 05/24/2023   Lab Results  Component Value Date   KPAFRELGTCHN 14.8 03/26/2023   LAMBDASER 18.3 03/26/2023   KAPLAMBRATIO 0.81 03/26/2023   Lab Results  Component Value Date   IGGSERUM 1,051 03/26/2023   IGA 16 (L) 03/26/2023   IGMSERUM 64 03/26/2023   Lab Results  Component Value Date   TOTALPROTELP 6.1 08/30/2022   ALBUMINELP 3.4 08/30/2022   A1GS 0.3 08/30/2022   A2GS 0.9 08/30/2022   BETS 1.0 08/30/2022   BETA2SER 0.4 11/23/2014   GAMS 0.6 08/30/2022   MSPIKE Not Observed 08/30/2022   SPEI Comment 10/19/2020     Chemistry      Component Value Date/Time   NA 141 03/26/2023 1100   NA 142 07/04/2022 1248   NA 141 01/10/2017 1115   NA 140 06/21/2016 0918   K 3.0 (L) 03/26/2023 1100   K 4.0 01/10/2017 1115   K 4.3 06/21/2016 0918   CL 106 03/26/2023 1100   CL 106 01/10/2017 1115   CO2 27 03/26/2023 1100   CO2 27 01/10/2017 1115   CO2 20 (L) 06/21/2016 0918   BUN 14 03/26/2023 1100   BUN 12 07/04/2022 1248   BUN 15 01/10/2017 1115   BUN 15.8 06/21/2016 0918   CREATININE 0.80 03/26/2023 1100   CREATININE 1.0 01/10/2017 1115   CREATININE 0.8 06/21/2016 0918      Component Value Date/Time   CALCIUM 9.5 03/26/2023 1100   CALCIUM 9.5 01/10/2017 1115   CALCIUM 9.4 06/21/2016 0918   ALKPHOS 44 03/26/2023 1100   ALKPHOS 40 01/10/2017 1115   ALKPHOS 66 06/21/2016 0918    AST 12 (L) 03/26/2023 1100   AST 17 06/21/2016 0918   ALT 17 03/26/2023 1100   ALT 19 01/10/2017 1115   ALT 37 06/21/2016 0918   BILITOT 0.4 03/26/2023 1100   BILITOT 0.32 06/21/2016 0918       Impression and Plan: Ms. Sesay is a very pleasant 73 yo African American female with recurrent lambda light chain myeloma.  She had her second stem cell transplant for light chain myeloma was in June 2016.    She then underwent a CAR-T infusion about 2 years ago.  Again she has done incredibly well with this.  She will get her Retacrit  today.  We will go ahead and plan to get her back to see us  after Memorial day now.   Ivor Mars, MD 4/24/20259:14 AM

## 2023-05-25 LAB — KAPPA/LAMBDA LIGHT CHAINS
Kappa free light chain: 17.9 mg/L (ref 3.3–19.4)
Kappa, lambda light chain ratio: 0.99 (ref 0.26–1.65)
Lambda free light chains: 18 mg/L (ref 5.7–26.3)

## 2023-05-28 LAB — MULTIPLE MYELOMA PANEL, SERUM
Albumin SerPl Elph-Mcnc: 3.5 g/dL (ref 2.9–4.4)
Albumin/Glob SerPl: 1.1 (ref 0.7–1.7)
Alpha 1: 0.2 g/dL (ref 0.0–0.4)
Alpha2 Glob SerPl Elph-Mcnc: 0.9 g/dL (ref 0.4–1.0)
B-Globulin SerPl Elph-Mcnc: 0.9 g/dL (ref 0.7–1.3)
Gamma Glob SerPl Elph-Mcnc: 1.1 g/dL (ref 0.4–1.8)
Globulin, Total: 3.2 g/dL (ref 2.2–3.9)
IgA: 31 mg/dL — ABNORMAL LOW (ref 64–422)
IgG (Immunoglobin G), Serum: 1283 mg/dL (ref 586–1602)
IgM (Immunoglobulin M), Srm: 66 mg/dL (ref 26–217)
Total Protein ELP: 6.7 g/dL (ref 6.0–8.5)

## 2023-05-29 ENCOUNTER — Encounter: Payer: Self-pay | Admitting: Hematology & Oncology

## 2023-06-05 ENCOUNTER — Ambulatory Visit (INDEPENDENT_AMBULATORY_CARE_PROVIDER_SITE_OTHER)

## 2023-06-05 VITALS — BP 130/80 | HR 85 | Temp 98.5°F | Ht 64.0 in | Wt 172.0 lb

## 2023-06-05 DIAGNOSIS — E538 Deficiency of other specified B group vitamins: Secondary | ICD-10-CM

## 2023-06-05 MED ORDER — CYANOCOBALAMIN 1000 MCG/ML IJ SOLN
1000.0000 ug | Freq: Once | INTRAMUSCULAR | Status: AC
  Administered 2023-06-05: 1000 ug via INTRAMUSCULAR

## 2023-06-05 NOTE — Progress Notes (Signed)
 Patient presents today for a b12 shot. They deny symptoms of recent infection, fever, and chills. They received injection in L arm.

## 2023-06-21 ENCOUNTER — Ambulatory Visit: Payer: Self-pay

## 2023-06-21 ENCOUNTER — Encounter: Payer: Self-pay | Admitting: Hematology & Oncology

## 2023-06-21 NOTE — Telephone Encounter (Signed)
  Chief Complaint: runny nose Symptoms: runny nose Frequency: chronic, ongoing for a few weeks Pertinent Negatives: Patient denies fever, SOB,  Disposition: [] ED /[] Urgent Care (no appt availability in office) / [x] Appointment(In office/virtual)/ []  [facility]/ [] Home Care/ [] Refused Recommended Disposition /[]  Mobile Bus/ []  Follow-up with PCP Additional Notes: Scheduled for 5/27.  Copied from CRM [id number]. Topic: Clinical - Red Word Triage >> Jun 21, 2023 11:35 AM Oddis Bench wrote: Red Word that prompted transfer to Nurse Triage: Patient is calling about head congestaion in her head, nose is running and there is some blood. Reason for Disposition  [1] Nasal discharge AND [2] present > 10 days  Answer Assessment - Initial Assessment Questions 1. ONSET: "When did the nasal discharge start?"      Weeks-couple 2. AMOUNT: "How much discharge is there?"      Not continuously drip, but does blow nose quite often 3. COUGH: "Do you have a cough?" If Yes, ask: "Describe the color of your sputum" (clear, white, yellow, green)     denies 4. RESPIRATORY DISTRESS: "Describe your breathing."      denies 5. FEVER: "Do you have a fever?" If Yes, ask: "What is your temperature, how was it measured, and when did it start?"     denies 6. SEVERITY: "Overall, how bad are you feeling right now?" (e.g., doesn't interfere with normal activities, staying home from school/work, staying in bed)      denies 7. OTHER SYMPTOMS: "Do you have any other symptoms?" (e.g., sore throat, earache, wheezing, vomiting)     denies  Protocols used: Common Cold-A-AH

## 2023-06-26 ENCOUNTER — Ambulatory Visit (INDEPENDENT_AMBULATORY_CARE_PROVIDER_SITE_OTHER): Payer: Self-pay | Admitting: Internal Medicine

## 2023-06-26 ENCOUNTER — Encounter: Payer: Self-pay | Admitting: Internal Medicine

## 2023-06-26 VITALS — BP 136/82 | HR 76 | Temp 98.7°F | Ht 64.0 in | Wt 173.8 lb

## 2023-06-26 DIAGNOSIS — R0982 Postnasal drip: Secondary | ICD-10-CM

## 2023-06-26 DIAGNOSIS — J309 Allergic rhinitis, unspecified: Secondary | ICD-10-CM | POA: Diagnosis not present

## 2023-06-26 DIAGNOSIS — I10 Essential (primary) hypertension: Secondary | ICD-10-CM | POA: Diagnosis not present

## 2023-06-26 MED ORDER — LEVOCETIRIZINE DIHYDROCHLORIDE 5 MG PO TABS: 5.0000 mg | ORAL_TABLET | Freq: Every evening | ORAL | 3 refills | Status: DC

## 2023-06-26 NOTE — Patient Instructions (Signed)
 Postnasal Drip Postnasal drip is the feeling of mucus going down the back of your throat. Mucus is a slimy substance that moistens and cleans your nose and throat, as well as the air pockets in face bones near your forehead and cheeks (sinuses). Small amounts of mucus pass from your nose and sinuses down the back of your throat all the time. This is normal. When you produce too much mucus or the mucus gets too thick, you can feel it. Some common causes of postnasal drip include: Having more mucus because of: A cold or the flu. Allergies. Cold air. Certain medicines. Gastroesophageal reflux. Having more mucus that is thicker because of: A sinus or nasal infection. Dry air. A food allergy. Follow these instructions at home: Relieving discomfort  Gargle with a mixture of salt and water 3-4 times a day or as needed. To make salt water, completely dissolve -1 tsp (3-6 g) of salt in 1 cup (237 mL) of warm water. If the air in your home is dry, use a humidifier to add moisture to the air. Use a saline spray or a container (neti pot) to flush out the nose (nasal irrigation). These methods can help clear away mucus and keep the nasal passages moist. General instructions Take over-the-counter and prescription medicines only as told by your health care provider. Follow instructions from your health care provider about eating or drinking restrictions. You may need to avoid caffeine. Avoid things that you know you are allergic to (allergens), like dust, mold, pollen, pets, or certain foods. Drink enough fluid to keep your urine pale yellow. Keep all follow-up visits. This is important. Contact a health care provider if: You have a fever. You have a sore throat or difficulty swallowing. You have a headache. You have sinus or ear pain. You have a cough that does not go away. The mucus from your nose becomes thick and is green or yellow in color. You have cold or flu symptoms that last more than 10  days. Summary Postnasal drip is the feeling of mucus going down the back of your throat. Use nasal irrigation or a nasal spray to help clear away mucus and keep the nasal passages moist. Avoid things that you know you are allergic to (allergens), like dust, mold, pollen, pets, or certain foods. This information is not intended to replace advice given to you by your health care provider. Make sure you discuss any questions you have with your health care provider. Document Revised: 12/16/2020 Document Reviewed: 12/16/2020 Elsevier Patient Education  2024 [employer].

## 2023-06-26 NOTE — Progress Notes (Signed)
 I,Victoria T Basil Lim, CMA,acting as a Neurosurgeon for Smiley Dung, MD.,have documented all relevant documentation on the behalf of Smiley Dung, MD,as directed by  Smiley Dung, MD while in the presence of Smiley Dung, MD.  Subjective:  Patient ID: Emily Greer , female    DOB: 1950/06/02 , 73 y.o.   MRN: [id number]  Chief Complaint  Patient presents with   URI    Patient presents today for continuous nasal drainage. When she blows her nose she bleeds. This has been an ongoing issue for over a month. At home she uses Vicks nasal spray. She is concerned due to how long this issue has lasted.     HPI Discussed the use of AI scribe software for clinical note transcription with the patient, who gave verbal consent to proceed.  History of Present Illness Emily Greer is a 73 year old female who presents with persistent sinus drainage.  She has been experiencing persistent sinus drainage without fever or chills, which she attributes to allergies. She has not been taking Claritin , which may be contributing to her symptoms. Everyone in her household has been sick, which may have contributed to her symptoms.  Her blood pressure at home has been normal, although she did not bring her list of readings to the appointment. Today it was measured at 136/82 mmHg. She has been monitoring her blood pressure at home and recalls being told to keep it below 130/80 mmHg.  She mentions that her household, including her 34 year old mother, has been sick but is now improving. She has been busy caring for her family and preparing to take her mother back to [place] .  She has a low dairy intake and has not had her ears flushed before, despite having wax buildup.   Hypertension This is a chronic problem. The current episode started more than 1 year ago. The problem has been gradually improving since onset. The problem is controlled. Pertinent negatives include no blurred vision. Past  treatments include beta blockers and diuretics. The current treatment provides moderate improvement. Compliance problems include exercise.  There is no history of pheochromocytoma or renovascular disease.     Past Medical History:  Diagnosis Date   Avascular necrosis of femoral head (HCC)    bilateral per CT 07-26-2015   Endometrial carcinoma [facility]) gyn oncologist-  dr Elio Gubler ([facility])/  radiation oncologist-- dr Eloise Hake   2013 dx  FIGO Stage 1A, Grade 2 endometrioid endometrial cancer s/p TAH w/ BSO and bilateral pelvic node dissection 10-31-2011 ;  recurrence at distal vagina 04/ 2014 s/p  brachytherapy (ended 07-29-2012);  2nd recurrence 12/ 2016  vaginal apex s/p  conformational radiotherapy 03-10-2015 to 04-20-2015   Family history of adverse reaction to anesthesia    mother ponv   GERD (gastroesophageal reflux disease)    Goals of care, counseling/discussion 08/21/2019   H/O stem cell transplant (HCC)    02/ 2000 and second one 06/ 2016   History of bacteremia    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   History of chemotherapy    last chemo 12-26-17   History of radiation therapy 6/4, 6/11, 6/19, 6/25, 07/29/2012   vagina 30.5 gray in 5 fx, HDR brachytherapy:   last radiation to vagina 03-10-2015 to 04-20-2015  50.4gray   History of radiation therapy 03/10/15-04/20/15   vagina 50.4 in 28 fractions   Hypertension    Lambda light chain myeloma [facility]) oncologist-  dr Maria Shiner ([facility])  and  [facility] -- dr Regis Captain gasparetto   dx 07/ 1999 s/p  VAD chemotherapy 11/ 1999,  purged autotransplant 03-21-1998 followed by auto stem cell transplant 03-29-1998;  recurrance w/ second autologous stem cell transplant 07-24-2014;  in Re-mission currently , chemo maintenance therapy   Osteoporosis 12/18/05   Increased  risk    PONV (postoperative nausea and vomiting)    Wears glasses      Family History  Problem Relation Age of Onset   Colon cancer Mother     Hypertension Father    Heart Problems Father      Current Outpatient Medications:    acyclovir  (ZOVIRAX ) 400 MG tablet, Take 1 tablet (400 mg total) by mouth 2 (two) times daily., Disp: 60 tablet, Rfl: 6   albuterol  (VENTOLIN  HFA) 108 (90 Base) MCG/ACT inhaler, Inhale 2 puffs into the lungs every 6 (six) hours as needed for wheezing or shortness of breath. 2 puffs 3 times daily x 5 days then every 6 hours as needed., Disp: 18 g, Rfl: 11   amLODipine  (NORVASC ) 10 MG tablet, TAKE 1 TABLET(10 MG) BY MOUTH EVERY MORNING (Patient taking differently: TAKE 1 TABLET(10 MG) BY MOUTH EVERY MORNING.), Disp: 90 tablet, Rfl: 2   aspirin EC 81 MG tablet, Take 81 mg by mouth daily., Disp: , Rfl:    benzonatate  (TESSALON  PERLES) 100 MG capsule, Take 1 capsule (100 mg total) by mouth 3 (three) times daily as needed for cough., Disp: 30 capsule, Rfl: 0   calcium-vitamin D  (OSCAL WITH D) 500-5 MG-MCG tablet, Take 1 tablet by mouth daily., Disp: , Rfl:    chlorhexidine (PERIDEX) 0.12 % solution, 10 mLs 2 (two) times daily., Disp: , Rfl:    Cholecalciferol  (VITAMIN D3) 2000 UNITS TABS, Take 2 tablets by mouth daily., Disp: , Rfl:    letrozole  (FEMARA ) 2.5 MG tablet, Take 1 tablet (2.5 mg total) by mouth daily., Disp: 30 tablet, Rfl: 12   metoprolol  tartrate (LOPRESSOR ) 50 MG tablet, Take 1/2 tablet by mouth twice daily, Disp: 90 tablet, Rfl: 1   potassium chloride  SA (KLOR-CON  M) 20 MEQ tablet, TAKE 1 TABLET(20 MEQ) BY MOUTH TWICE DAILY, Disp: 60 tablet, Rfl: 3   Safety Seal Miscellaneous MISC, Apply 1 Application topically in the morning. Medication Name: Hormonic Hair Solution (Mioxidil 7%, Finasteride 0.05%), Disp: 30 mL, Rfl: 6   triamcinolone  (NASACORT ) 55 MCG/ACT AERO nasal inhaler, Place 1 spray into the nose 2 (two) times daily., Disp: 1 each, Rfl: 2   levocetirizine (XYZAL  ALLERGY 24HR) 5 MG tablet, Take 1 tablet (5 mg total) by mouth every evening., Disp: 30 tablet, Rfl: 3 No current facility-administered  medications for this visit.  Facility-Administered Medications Ordered in Other Visits:    0.9 %  sodium chloride  infusion, , Intravenous, Once PRN, Ennever, Peter R, MD   albuterol  (PROVENTIL ) (2.5 MG/3ML) 0.083% nebulizer solution 2.5 mg, 2.5 mg, Nebulization, Once PRN, Ennever, Peter R, MD   sodium chloride  flush (NS) 0.9 % injection 10 mL, 10 mL, Intravenous, PRN, Tish Forge, NP, 10 mL at 12/26/17 1130   sodium chloride  flush (NS) 0.9 % injection 10 mL, 10 mL, Intravenous, PRN, Ennever, Peter R, MD, 10 mL at 04/24/19 1305   sodium chloride  flush (NS) 0.9 % injection 10 mL, 10 mL, Intravenous, PRN, Ennever, Peter R, MD, 10 mL at 08/21/19 1233   sodium chloride  flush (NS) 0.9 % injection 10 mL, 10 mL, Intracatheter, PRN, Tish Forge, NP, 10 mL at 10/01/19 1053   sodium  chloride flush (NS) 0.9 % injection 10 mL, 10 mL, Intracatheter, PRN, Tish Forge, NP, 10 mL at 12/11/19 1414   sodium chloride  flush (NS) 0.9 % injection 10 mL, 10 mL, Intravenous, PRN, Tish Forge, NP, 10 mL at 07/06/21 1105   sodium chloride  flush (NS) 0.9 % injection 10 mL, 10 mL, Intracatheter, PRN, Tish Forge, NP, 10 mL at 07/14/21 0931   Allergies  Allergen Reactions   Codeine Nausea Only     Review of Systems  Constitutional: Negative.  Negative for chills and fever.  HENT:  Positive for postnasal drip and rhinorrhea.   Eyes:  Negative for blurred vision.  Respiratory: Negative.    Cardiovascular: Negative.   Gastrointestinal: Negative.   Neurological: Negative.   Psychiatric/Behavioral: Negative.       Today's Vitals   06/26/23 1537  BP: 136/82  Pulse: 76  Temp: 98.7 F (37.1 C)  SpO2: 98%  Weight: 173 lb 12.8 oz (78.8 kg)  Height: 5\' 4"  (1.626 m)   Body mass index is 29.83 kg/m.  Wt Readings from Last 3 Encounters:  06/26/23 173 lb 12.8 oz (78.8 kg)  06/05/23 172 lb (78 kg)  05/01/23 172 lb (78 kg)    BP Readings from Last 3 Encounters:  06/26/23 136/82  06/05/23 130/80   05/24/23 (!) 158/68     Objective:  Physical Exam Vitals and nursing note reviewed.  Constitutional:      Appearance: Normal appearance.  HENT:     Head: Normocephalic and atraumatic.     Right Ear: Tympanic membrane, ear canal and external ear normal. There is no impacted cerumen.     Left Ear: Tympanic membrane, ear canal and external ear normal. There is no impacted cerumen.     Mouth/Throat:     Pharynx: Posterior oropharyngeal erythema present. No oropharyngeal exudate.  Eyes:     Extraocular Movements: Extraocular movements intact.  Cardiovascular:     Rate and Rhythm: Normal rate and regular rhythm.     Heart sounds: Normal heart sounds.  Pulmonary:     Effort: Pulmonary effort is normal.     Breath sounds: Normal breath sounds.  Musculoskeletal:     Cervical back: Normal range of motion.  Skin:    General: Skin is warm.  Neurological:     General: No focal deficit present.     Mental Status: She is alert.  Psychiatric:        Mood and Affect: Mood normal.        Behavior: Behavior normal.       Assessment And Plan:  Allergic rhinitis with postnasal drip Assessment & Plan: Chronic sinus drainage likely due to allergic rhinitis. Throat redness consistent with sinus drainage. No exudates noted. - Refilled levocetirizine (Xyzal ). Instructed to take at night.  Orders: -     Levocetirizine Dihydrochloride ; Take 1 tablet (5 mg total) by mouth every evening.  Dispense: 30 tablet; Refill: 3  Primary hypertension Assessment & Plan: Blood pressure 136/82 mmHg, above target <130/80 mmHg. Reports normal home readings. - Cancelled next week's blood pressure check. - Rescheduled follow-up in four months.    Return in 4 months (on 10/27/2023), or bp check, cancel next week BP check, for also schedule physical january 2026 please.  Patient was given opportunity to ask questions. Patient verbalized understanding of the plan and was able to repeat key elements of the plan.  All questions were answered to their satisfaction.   I, Smiley Dung, MD, have  reviewed all documentation for this visit. The documentation on 06/26/23 for the exam, diagnosis, procedures, and orders are all accurate and complete.   IF YOU HAVE BEEN REFERRED TO A SPECIALIST, IT MAY TAKE 1-2 WEEKS TO SCHEDULE/PROCESS THE REFERRAL. IF YOU HAVE NOT HEARD FROM US /SPECIALIST IN TWO WEEKS, PLEASE GIVE US  A CALL AT [phone].   THE PATIENT IS ENCOURAGED TO PRACTICE SOCIAL DISTANCING DUE TO THE COVID-19 PANDEMIC.

## 2023-06-29 NOTE — Assessment & Plan Note (Signed)
 Chronic sinus drainage likely due to allergic rhinitis. Throat redness consistent with sinus drainage. No exudates noted. - Refilled levocetirizine (Xyzal ). Instructed to take at night.

## 2023-06-29 NOTE — Assessment & Plan Note (Signed)
 Blood pressure 136/82 mmHg, above target <130/80 mmHg. Reports normal home readings. - Cancelled next week's blood pressure check. - Rescheduled follow-up in four months.

## 2023-07-04 ENCOUNTER — Other Ambulatory Visit: Payer: Self-pay | Admitting: Hematology & Oncology

## 2023-07-04 ENCOUNTER — Ambulatory Visit: Payer: Medicare Other | Admitting: Internal Medicine

## 2023-07-09 DIAGNOSIS — H40033 Anatomical narrow angle, bilateral: Secondary | ICD-10-CM | POA: Diagnosis not present

## 2023-07-09 DIAGNOSIS — H43812 Vitreous degeneration, left eye: Secondary | ICD-10-CM | POA: Diagnosis not present

## 2023-07-09 DIAGNOSIS — Z961 Presence of intraocular lens: Secondary | ICD-10-CM | POA: Diagnosis not present

## 2023-07-09 DIAGNOSIS — H26493 Other secondary cataract, bilateral: Secondary | ICD-10-CM | POA: Diagnosis not present

## 2023-07-12 ENCOUNTER — Inpatient Hospital Stay: Attending: Hematology & Oncology

## 2023-07-12 ENCOUNTER — Inpatient Hospital Stay (HOSPITAL_BASED_OUTPATIENT_CLINIC_OR_DEPARTMENT_OTHER): Admitting: Medical Oncology

## 2023-07-12 ENCOUNTER — Ambulatory Visit: Payer: Self-pay | Admitting: Medical Oncology

## 2023-07-12 ENCOUNTER — Inpatient Hospital Stay

## 2023-07-12 ENCOUNTER — Ambulatory Visit: Payer: Self-pay | Admitting: Hematology & Oncology

## 2023-07-12 ENCOUNTER — Encounter: Payer: Self-pay | Admitting: Medical Oncology

## 2023-07-12 VITALS — BP 140/66 | HR 84 | Temp 98.1°F | Resp 20 | Ht 64.0 in | Wt 174.7 lb

## 2023-07-12 DIAGNOSIS — C9000 Multiple myeloma not having achieved remission: Secondary | ICD-10-CM | POA: Diagnosis not present

## 2023-07-12 DIAGNOSIS — C9001 Multiple myeloma in remission: Secondary | ICD-10-CM

## 2023-07-12 DIAGNOSIS — Z923 Personal history of irradiation: Secondary | ICD-10-CM | POA: Diagnosis not present

## 2023-07-12 DIAGNOSIS — C541 Malignant neoplasm of endometrium: Secondary | ICD-10-CM | POA: Diagnosis not present

## 2023-07-12 DIAGNOSIS — Z79899 Other long term (current) drug therapy: Secondary | ICD-10-CM | POA: Diagnosis not present

## 2023-07-12 DIAGNOSIS — Z9484 Stem cells transplant status: Secondary | ICD-10-CM | POA: Diagnosis not present

## 2023-07-12 DIAGNOSIS — Z885 Allergy status to narcotic agent status: Secondary | ICD-10-CM | POA: Insufficient documentation

## 2023-07-12 DIAGNOSIS — K922 Gastrointestinal hemorrhage, unspecified: Secondary | ICD-10-CM | POA: Diagnosis not present

## 2023-07-12 DIAGNOSIS — Z8542 Personal history of malignant neoplasm of other parts of uterus: Secondary | ICD-10-CM | POA: Insufficient documentation

## 2023-07-12 DIAGNOSIS — E049 Nontoxic goiter, unspecified: Secondary | ICD-10-CM | POA: Diagnosis not present

## 2023-07-12 DIAGNOSIS — R61 Generalized hyperhidrosis: Secondary | ICD-10-CM | POA: Diagnosis not present

## 2023-07-12 DIAGNOSIS — Z79811 Long term (current) use of aromatase inhibitors: Secondary | ICD-10-CM | POA: Insufficient documentation

## 2023-07-12 DIAGNOSIS — D500 Iron deficiency anemia secondary to blood loss (chronic): Secondary | ICD-10-CM | POA: Insufficient documentation

## 2023-07-12 DIAGNOSIS — R221 Localized swelling, mass and lump, neck: Secondary | ICD-10-CM | POA: Diagnosis not present

## 2023-07-12 LAB — CMP (CANCER CENTER ONLY)
ALT: 20 U/L (ref 0–44)
AST: 16 U/L (ref 15–41)
Albumin: 4.2 g/dL (ref 3.5–5.0)
Alkaline Phosphatase: 53 U/L (ref 38–126)
Anion gap: 8 (ref 5–15)
BUN: 16 mg/dL (ref 8–23)
CO2: 28 mmol/L (ref 22–32)
Calcium: 10.2 mg/dL (ref 8.9–10.3)
Chloride: 103 mmol/L (ref 98–111)
Creatinine: 1.13 mg/dL — ABNORMAL HIGH (ref 0.44–1.00)
GFR, Estimated: 52 mL/min — ABNORMAL LOW (ref >60)
Glucose, Bld: 96 mg/dL (ref 70–99)
Potassium: 3.7 mmol/L (ref 3.5–5.1)
Sodium: 139 mmol/L (ref 135–145)
Total Bilirubin: 0.3 mg/dL (ref 0.0–1.2)
Total Protein: 7.5 g/dL (ref 6.5–8.1)

## 2023-07-12 LAB — IRON AND IRON BINDING CAPACITY (CC-WL,HP ONLY)
Iron: 74 ug/dL (ref 28–170)
Saturation Ratios: 24 % (ref 10.4–31.8)
TIBC: 311 ug/dL (ref 250–450)
UIBC: 237 ug/dL

## 2023-07-12 LAB — CBC WITH DIFFERENTIAL (CANCER CENTER ONLY)
Abs Immature Granulocytes: 0.01 10*3/uL (ref 0.00–0.07)
Basophils Absolute: 0.1 10*3/uL (ref 0.0–0.1)
Basophils Relative: 1 %
Eosinophils Absolute: 0.1 10*3/uL (ref 0.0–0.5)
Eosinophils Relative: 3 %
HCT: 32.9 % — ABNORMAL LOW (ref 36.0–46.0)
Hemoglobin: 10.7 g/dL — ABNORMAL LOW (ref 12.0–15.0)
Immature Granulocytes: 0 %
Lymphocytes Relative: 24 %
Lymphs Abs: 0.9 10*3/uL (ref 0.7–4.0)
MCH: 26.5 pg (ref 26.0–34.0)
MCHC: 32.5 g/dL (ref 30.0–36.0)
MCV: 81.4 fL (ref 80.0–100.0)
Monocytes Absolute: 0.6 10*3/uL (ref 0.1–1.0)
Monocytes Relative: 16 %
Neutro Abs: 2 10*3/uL (ref 1.7–7.7)
Neutrophils Relative %: 56 %
Platelet Count: 197 10*3/uL (ref 150–400)
RBC: 4.04 MIL/uL (ref 3.87–5.11)
RDW: 17.7 % — ABNORMAL HIGH (ref 11.5–15.5)
Smear Review: NORMAL
WBC Count: 3.6 10*3/uL — ABNORMAL LOW (ref 4.0–10.5)
nRBC: 0 % (ref 0.0–0.2)

## 2023-07-12 LAB — FERRITIN: Ferritin: 1662 ng/mL — ABNORMAL HIGH (ref 11–307)

## 2023-07-12 LAB — TSH: TSH: 3.2 u[IU]/mL (ref 0.350–4.500)

## 2023-07-12 LAB — LACTATE DEHYDROGENASE: LDH: 151 U/L (ref 98–192)

## 2023-07-12 MED ORDER — SODIUM CHLORIDE 0.9% FLUSH
10.0000 mL | INTRAVENOUS | Status: DC | PRN
  Administered 2023-07-12: 10 mL via INTRAVENOUS

## 2023-07-12 MED ORDER — HEPARIN SOD (PORK) LOCK FLUSH 100 UNIT/ML IV SOLN
500.0000 [IU] | Freq: Once | INTRAVENOUS | Status: DC
  Administered 2023-07-12: 500 [IU] via INTRAVENOUS

## 2023-07-12 MED ORDER — EPOETIN ALFA-EPBX 40000 UNIT/ML IJ SOLN
40000.0000 [IU] | Freq: Once | INTRAMUSCULAR | Status: AC
  Administered 2023-07-12: 40000 [IU] via SUBCUTANEOUS
  Filled 2023-07-12: qty 1

## 2023-07-12 MED ORDER — SODIUM CHLORIDE 0.9% FLUSH
10.0000 mL | Freq: Once | INTRAVENOUS | Status: DC
  Administered 2023-07-12: 10 mL via INTRAVENOUS

## 2023-07-12 NOTE — Patient Instructions (Signed)

## 2023-07-12 NOTE — Progress Notes (Signed)
 She is Hematology and Oncology Follow Up Visit  Emily Greer [id number] [birth year] 73 y.o. 07/12/2023   Principle Diagnosis:  Recurrent lambda light chain myeloma - nl cytogenetics History of recurrent endometrial carcinoma  - NED since early 2017 Iron deficiency anemia -blood loss   Past Therapy:             Status post second autologous stem cell transplant on 07/24/2014 Maintenance therapy with Pomalidomide [name] 2 week Velcade  - d/c'ed Radiation therapy for endometrial recurrence - completed 04/20/2015 Pomalyst /Kyprolis  70mg /m2 IV q 2 weeks - s/p cycle #12 - held on 12/26/2017 for hematuria Daratumumab /Pomalyst  (1 mg) - status post cycle 19 -- d/c on 08/21/2019 Melflufen 40 mg IV q 4 weeks -- started on 08/27/2019, s/p cycle #2 --  D/c due to FDA removal Selinexor  60 mg po q week -- start on 01/19/2020 -- changed on 03/18/2020 -- d/c on 04/27/2020 Blenrep  2.5 mg/m2 IV q 3 weeks -- started on 05/20/2020, s/p cycle #7 --D/C secondary to progression on 12/22/2020 Carfilzomib /Sarclisa  -- s/p cycle #3 -- start on 01/03/2021 Cytoxan /Pomalyst /decadron  -- start cycle #1 on 06/15/2021 - d/c 06/2021 S/p Cilta-cel CAR-T infusion -- 07/22/2021 Xgeva - D/c following 07/31/20224 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   Today she states that she has been doing well.   She has had no problem with infections.  She has had no problems with cough or shortness of breath.  She continues on letrozole  for the endometrial carcinoma  There has been no bleeding to her knowledge: denies epistaxis, gingivitis, hemoptysis, hematemesis, hematuria, melena, excessive bruising, blood donation.   There is been no leg  swelling.  She has had no rashes. Some night sweats, no unintentional weight loss.   Overall, I would say that her performance status is probably ECOG 1.     Wt Readings from Last 3 Encounters:  07/12/23 174 lb 11.2 oz (79.2 kg)  06/26/23 173 lb 12.8 oz (78.8 kg)  06/05/23 172 lb (78 kg)   Medications:  Allergies as of 07/12/2023       Reactions   Codeine Nausea Only        Medication List        Accurate as of July 12, 2023  9:22 AM. If you have any questions, ask your nurse or doctor.          acyclovir  400 MG tablet Commonly known as: ZOVIRAX  TAKE 1 TABLET(400 MG) BY MOUTH TWICE DAILY   albuterol  108 (90 Base) MCG/ACT inhaler Commonly known as: VENTOLIN  HFA Inhale 2 puffs into the lungs every 6 (six) hours as needed for wheezing or shortness of breath. 2 puffs 3 times daily x 5 days then every 6 hours as needed.   amLODipine  10 MG tablet Commonly known as: NORVASC  TAKE 1 TABLET(10 MG) BY MOUTH EVERY MORNING   aspirin EC 81 MG tablet Take 81 mg by mouth daily.   benzonatate  100 MG capsule Commonly known as: Tessalon  Perles Take 1 capsule (100 mg total) by mouth 3 (three) times daily as needed for cough.   calcium-vitamin D  500-5 MG-MCG tablet Commonly known as: OSCAL WITH D Take 1 tablet by mouth  daily.   chlorhexidine 0.12 % solution Commonly known as: PERIDEX 10 mLs 2 (two) times daily.   letrozole  2.5 MG tablet Commonly known as: FEMARA  Take 1 tablet (2.5 mg total) by mouth daily.   levocetirizine 5 MG tablet Commonly known as: Xyzal  Allergy 24HR Take 1 tablet (5 mg total) by mouth every evening.   metoprolol  tartrate 50 MG tablet Commonly known as: LOPRESSOR  Take 1/2 tablet by mouth twice daily   potassium chloride  SA 20 MEQ tablet Commonly known as: KLOR-CON  M TAKE 1 TABLET(20 MEQ) BY MOUTH TWICE DAILY   Safety Seal Miscellaneous Misc Apply 1 Application topically in the morning. Medication Name: Hormonic Hair Solution (Mioxidil 7%,  Finasteride 0.05%)   triamcinolone  55 MCG/ACT Aero nasal inhaler Commonly known as: NASACORT  Place 1 spray into the nose 2 (two) times daily.   Vitamin D3 50 MCG (2000 UT) Tabs Take 2 tablets by mouth daily.        Allergies:  Allergies  Allergen Reactions   Codeine Nausea Only    Past Medical History, Surgical history, Social history, and Family History were reviewed and updated.  Review of Systems: Review of Systems  Constitutional: Negative.   HENT: Negative.    Eyes:  Negative for blurred vision.  Respiratory: Negative.    Cardiovascular: Negative.   Gastrointestinal: Negative.   Genitourinary: Negative.   Musculoskeletal: Negative.   Skin: Negative.   Neurological: Negative.   Endo/Heme/Allergies: Negative.   Psychiatric/Behavioral: Negative.     Physical Exam:  Vitals:   07/12/23 0916  BP: (!) 140/66  Pulse: 84  Resp: 20  Temp: 98.1 F (36.7 C)  SpO2: 100%    Wt Readings from Last 3 Encounters:  07/12/23 174 lb 11.2 oz (79.2 kg)  06/26/23 173 lb 12.8 oz (78.8 kg)  06/05/23 172 lb (78 kg)    Physical Exam Vitals reviewed.  HENT:     Head: Normocephalic and atraumatic.   Eyes:     Pupils: Pupils are equal, round, and reactive to light.    Cardiovascular:     Rate and Rhythm: Normal rate and regular rhythm.     Heart sounds: Normal heart sounds.  Pulmonary:     Effort: Pulmonary effort is normal.     Breath sounds: Normal breath sounds.  Abdominal:     General: Bowel sounds are normal.     Palpations: Abdomen is soft.   Musculoskeletal:        General: No tenderness or deformity. Normal range of motion.     Cervical back: Normal range of motion.  Lymphadenopathy:     Cervical: Cervical adenopathy (Quil egg sized non-tender right palpable mass- likely cervical lymph node) present.   Skin:    General: Skin is warm and dry.     Findings: No erythema or rash.   Neurological:     Mental Status: She is alert and oriented to person,  place, and time.   Psychiatric:        Behavior: Behavior normal.        Thought Content: Thought content normal.        Judgment: Judgment normal.      Lab Results  Component Value Date   WBC 3.6 (L) 07/12/2023   HGB 10.7 (L) 07/12/2023   HCT 32.9 (L) 07/12/2023   MCV 81.4 07/12/2023   PLT 197 07/12/2023   Lab Results  Component Value Date   FERRITIN 1,460 (H) 05/24/2023   IRON 77 05/24/2023   TIBC 298 05/24/2023  UIBC 221 05/24/2023   IRONPCTSAT 26 05/24/2023   Lab Results  Component Value Date   RETICCTPCT 1.5 05/24/2023   RBC 4.04 07/12/2023   Lab Results  Component Value Date   KPAFRELGTCHN 17.9 05/24/2023   LAMBDASER 18.0 05/24/2023   KAPLAMBRATIO 0.99 05/24/2023   Lab Results  Component Value Date   IGGSERUM 1,283 05/24/2023   IGA 31 (L) 05/24/2023   IGMSERUM 66 05/24/2023   Lab Results  Component Value Date   TOTALPROTELP 6.7 05/24/2023   ALBUMINELP 3.4 08/30/2022   A1GS 0.3 08/30/2022   A2GS 0.9 08/30/2022   BETS 1.0 08/30/2022   BETA2SER 0.4 11/23/2014   GAMS 0.6 08/30/2022   MSPIKE Not Observed 08/30/2022   SPEI Comment 10/19/2020     Chemistry      Component Value Date/Time   NA 141 05/24/2023 0824   NA 142 07/04/2022 1248   NA 141 01/10/2017 1115   NA 140 06/21/2016 0918   K 2.8 (L) 05/24/2023 0824   K 4.0 01/10/2017 1115   K 4.3 06/21/2016 0918   CL 103 05/24/2023 0824   CL 106 01/10/2017 1115   CO2 29 05/24/2023 0824   CO2 27 01/10/2017 1115   CO2 20 (L) 06/21/2016 0918   BUN 16 05/24/2023 0824   BUN 12 07/04/2022 1248   BUN 15 01/10/2017 1115   BUN 15.8 06/21/2016 0918   CREATININE 1.08 (H) 05/24/2023 0824   CREATININE 1.0 01/10/2017 1115   CREATININE 0.8 06/21/2016 0918      Component Value Date/Time   CALCIUM 9.7 05/24/2023 0824   CALCIUM 9.5 01/10/2017 1115   CALCIUM 9.4 06/21/2016 0918   ALKPHOS 46 05/24/2023 0824   ALKPHOS 40 01/10/2017 1115   ALKPHOS 66 06/21/2016 0918   AST 15 05/24/2023 0824   AST 17  06/21/2016 0918   ALT 15 05/24/2023 0824   ALT 19 01/10/2017 1115   ALT 37 06/21/2016 0918   BILITOT 0.2 05/24/2023 0824   BILITOT 0.32 06/21/2016 0918     Encounter Diagnoses  Name Primary?   Multiple myeloma, remission status unspecified (HCC)    Lambda light chain myeloma (HCC)    Endometrial carcinoma (HCC)    Iron deficiency anemia due to chronic blood loss Yes   Impression and Plan: Emily Greer is a very pleasant 73 yo African American female with recurrent lambda light chain myeloma.  She had her second stem cell transplant for light chain myeloma was in June 2016. She then underwent a CAR-T infusion about 2 years ago.  Again she has done incredibly well with this. She also has a history of recurrent endometrial carcinoma but has been NED since 2017.   Retacrit  for Hgb of 10.7 Iron studied pending- will supplement if needed with IV therapy  Myeloma studies pending but have been stable New palpable likely lymph node of right neck. PET pending.    Disposition D/C port PET order placed- to be scheduled within the next few weeks TSH added to today's labs Retacrit  today RTC 2 months MD, port labs(CBC w/, CMP, MM panel, light chains, LDH, iron, ferritin, retic), retacrit  injection   Sharla Davis, PA-C 6/12/20259:22 AM

## 2023-07-13 LAB — KAPPA/LAMBDA LIGHT CHAINS
Kappa free light chain: 25.8 mg/L — ABNORMAL HIGH (ref 3.3–19.4)
Kappa, lambda light chain ratio: 0.97 (ref 0.26–1.65)
Lambda free light chains: 26.6 mg/L — ABNORMAL HIGH (ref 5.7–26.3)

## 2023-07-13 LAB — IGG, IGA, IGM
IgA: 61 mg/dL — ABNORMAL LOW (ref 64–422)
IgG (Immunoglobin G), Serum: 1421 mg/dL (ref 586–1602)
IgM (Immunoglobulin M), Srm: 59 mg/dL (ref 26–217)

## 2023-07-20 IMAGING — MG DIGITAL DIAGNOSTIC BILAT W/ TOMO W/ CAD
6 of 10 series · 6 of 30 positions shown · non-contrast
Comparison: Previous exam(s).

CLINICAL DATA: Patient describes diffuse sharp shooting pain in the
UPPER and OUTER portions of the RIGHT breast. History of endometrial
cancer. Patient has RIGHT-sided Port-A-Cath.

EXAM:
DIGITAL DIAGNOSTIC BILATERAL MAMMOGRAM WITH TOMOSYNTHESIS AND CAD
TECHNIQUE: Bilateral digital diagnostic mammography and breast tomosynthesis
was performed. The images were evaluated with computer-aided
detection.

[R CC synth-2D]
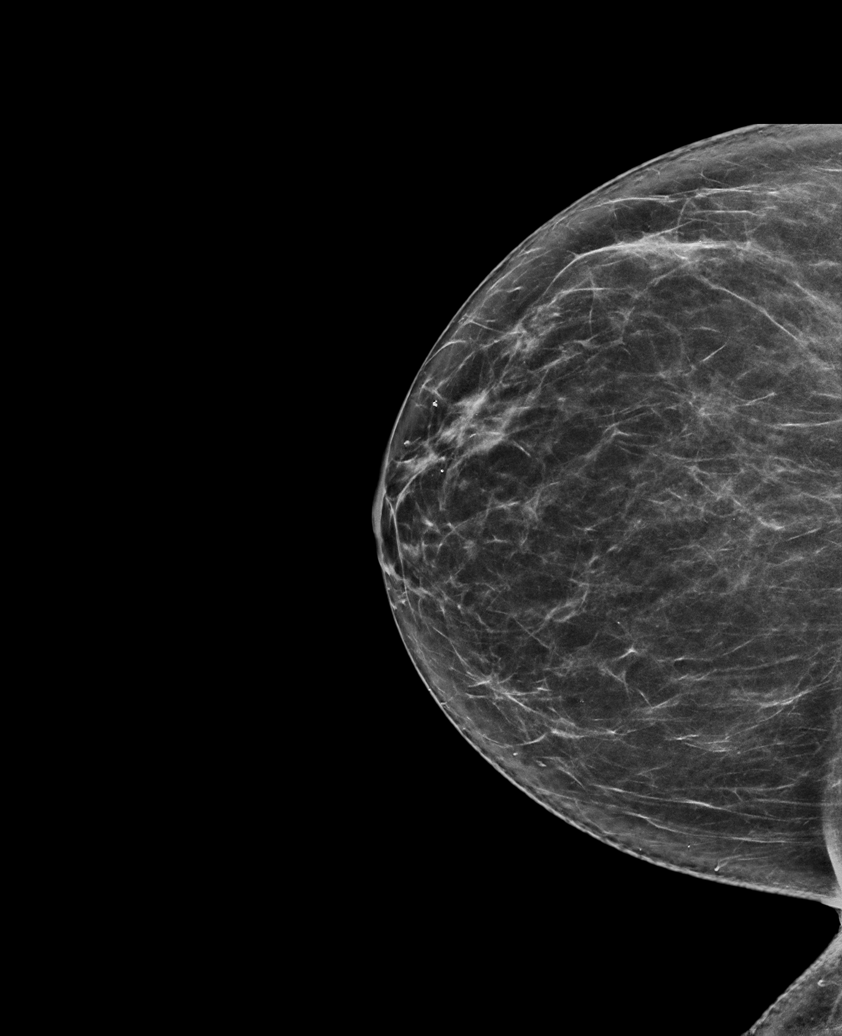

[R MLO synth-2D (1 of 2)]
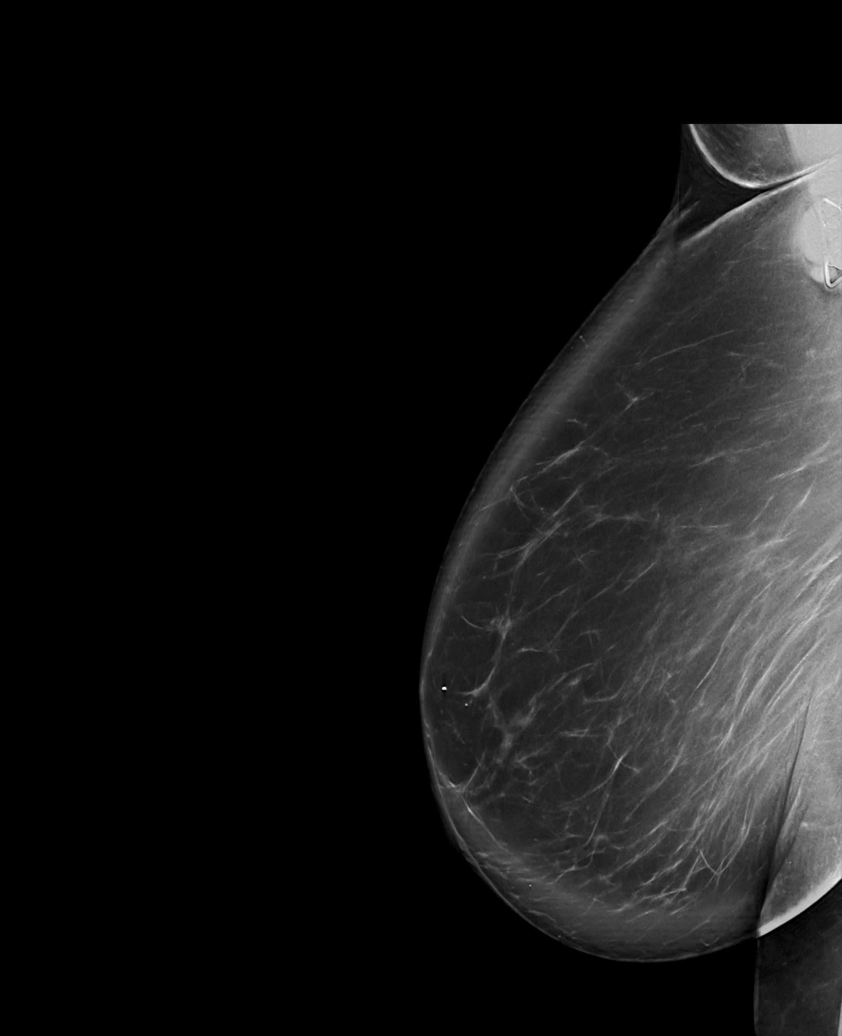

[L MLO synth-2D]
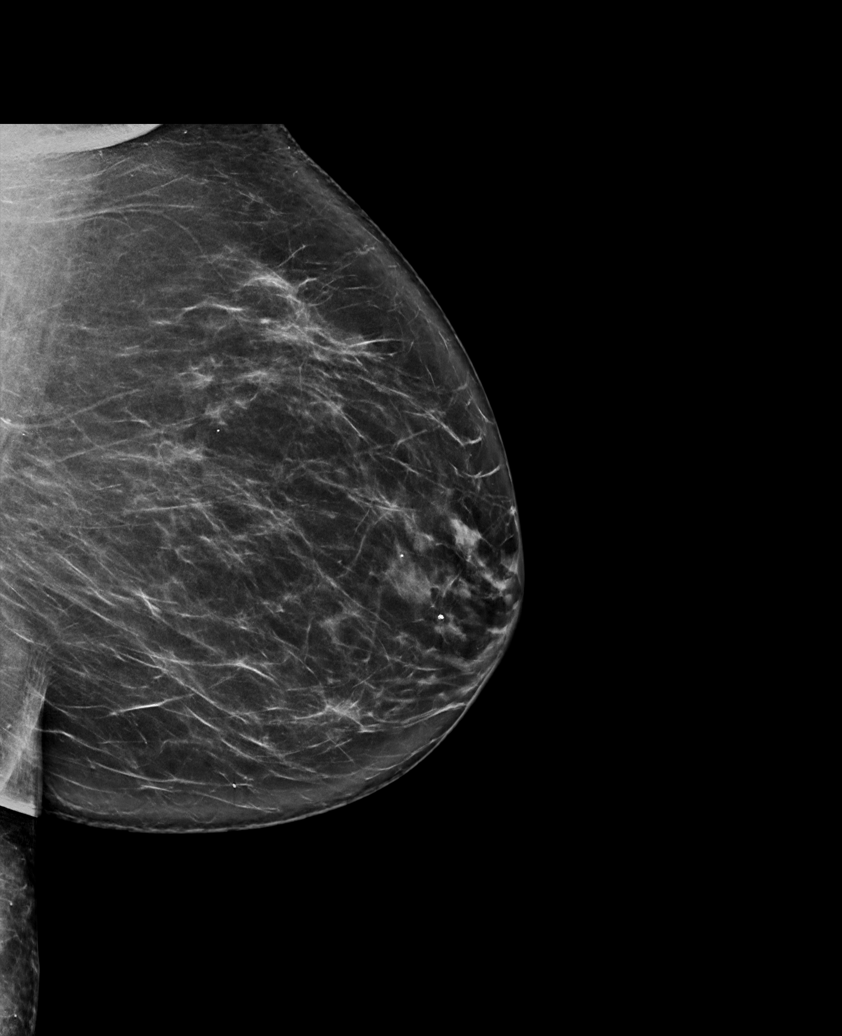

[L CC synth-2D]
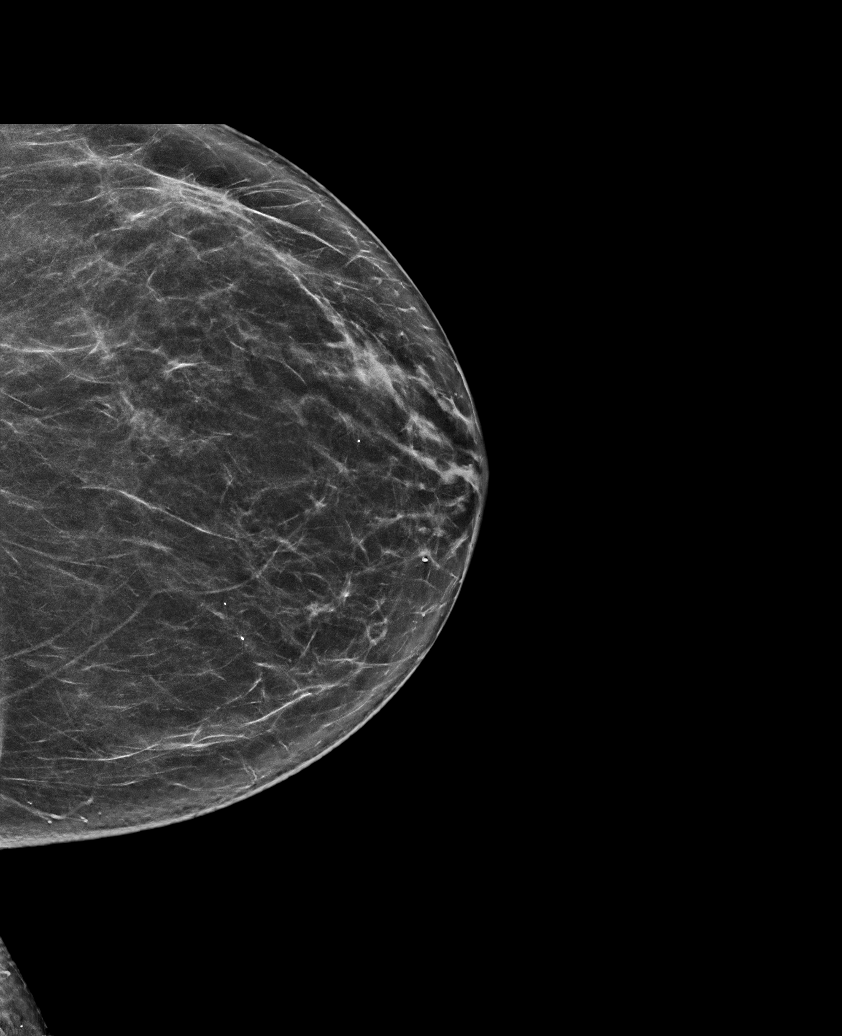

[R MLO synth-2D (2 of 2)]
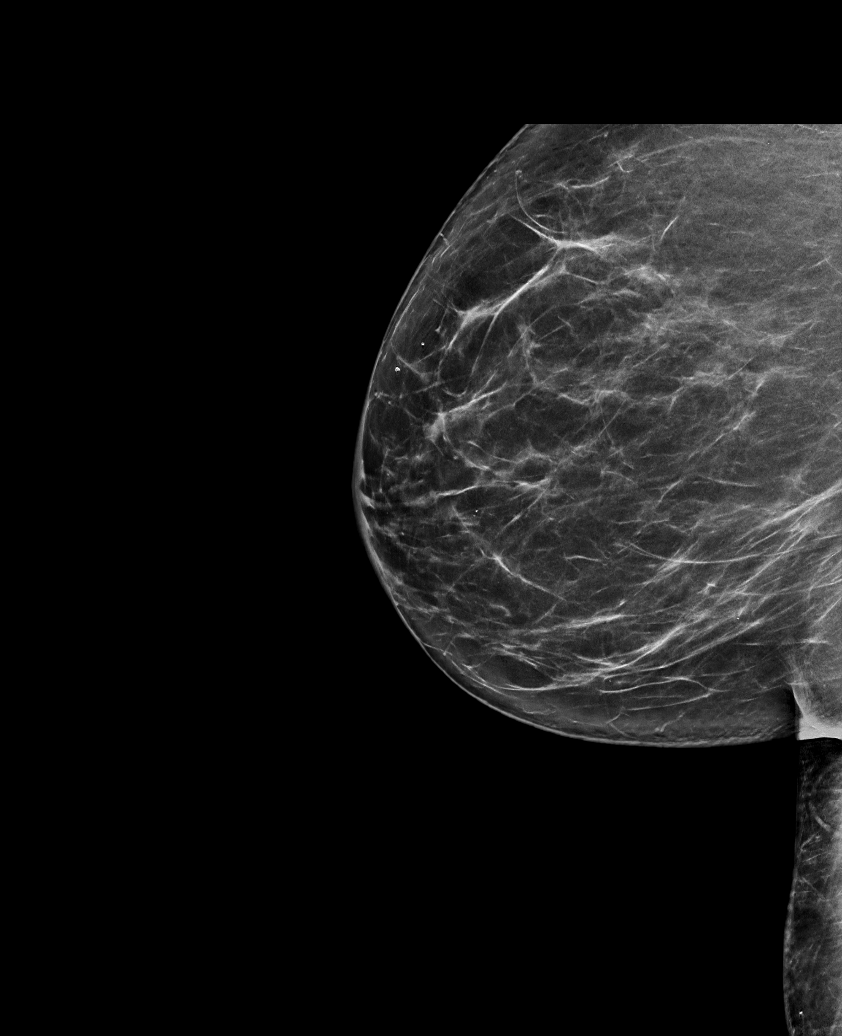

[R MLO tomo · tomo slice 43/85.0]
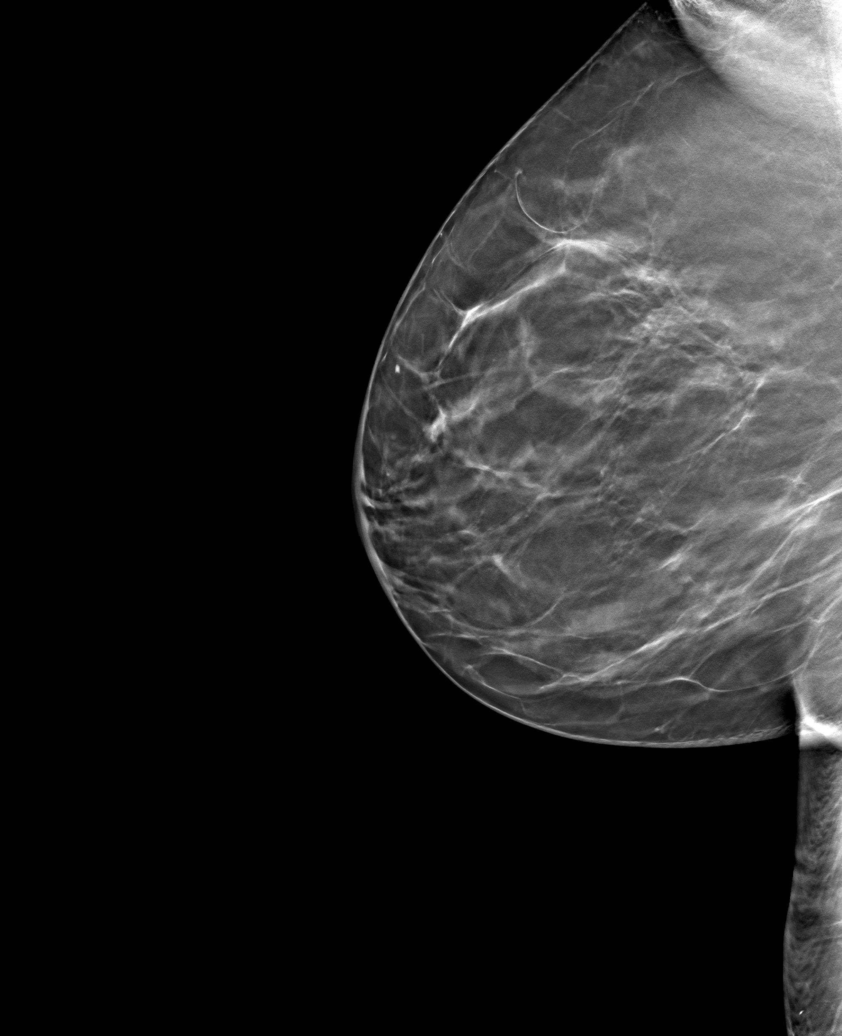

[6 of 30 positions shown; findings below may reference images not displayed]

ACR Breast Density Category b: There are scattered areas of
fibroglandular density.
FINDINGS: No suspicious mass, distortion, or microcalcifications are
identified to suggest presence of malignancy.
IMPRESSION: No mammographic evidence for malignancy.

RECOMMENDATION:
Screening mammogram in one year.(Code:[id number])

I have discussed the findings and recommendations with the patient.
If applicable, a reminder letter will be sent to the patient
regarding the next appointment.

BI-RADS CATEGORY  1: Negative.

## 2023-07-23 ENCOUNTER — Ambulatory Visit (HOSPITAL_COMMUNITY)
Admission: RE | Admit: 2023-07-23 | Discharge: 2023-07-23 | Disposition: A | Discharge status: Home / Self Care | Source: Ambulatory Visit | Attending: Medical Oncology | Admitting: Medical Oncology

## 2023-07-23 DIAGNOSIS — C541 Malignant neoplasm of endometrium: Secondary | ICD-10-CM | POA: Insufficient documentation

## 2023-07-23 DIAGNOSIS — R221 Localized swelling, mass and lump, neck: Secondary | ICD-10-CM | POA: Diagnosis not present

## 2023-07-23 DIAGNOSIS — C9000 Multiple myeloma not having achieved remission: Secondary | ICD-10-CM | POA: Diagnosis not present

## 2023-07-23 LAB — GLUCOSE, CAPILLARY: Glucose-Capillary: 113 mg/dL — ABNORMAL HIGH (ref 70–99)

## 2023-07-23 MED ORDER — FLUDEOXYGLUCOSE F - 18 (FDG) INJECTION
8.7000 | Freq: Once | INTRAVENOUS | Status: AC | PRN
Start: 2023-07-23 — End: 2023-07-23
  Administered 2023-07-23: 8.61 via INTRAVENOUS

## 2023-07-25 ENCOUNTER — Encounter: Payer: Self-pay | Admitting: *Deleted

## 2023-08-02 DIAGNOSIS — R6889 Other general symptoms and signs: Secondary | ICD-10-CM | POA: Diagnosis not present

## 2023-08-02 DIAGNOSIS — C9001 Multiple myeloma in remission: Secondary | ICD-10-CM | POA: Diagnosis not present

## 2023-08-02 DIAGNOSIS — C9000 Multiple myeloma not having achieved remission: Secondary | ICD-10-CM | POA: Diagnosis not present

## 2023-08-02 DIAGNOSIS — Z9289 Personal history of other medical treatment: Secondary | ICD-10-CM | POA: Diagnosis not present

## 2023-08-09 DIAGNOSIS — M87180 Osteonecrosis due to drugs, jaw: Secondary | ICD-10-CM | POA: Diagnosis not present

## 2023-08-09 DIAGNOSIS — K045 Chronic apical periodontitis: Secondary | ICD-10-CM | POA: Diagnosis not present

## 2023-08-09 DIAGNOSIS — K06010 Localized gingival recession, unspecified: Secondary | ICD-10-CM | POA: Diagnosis not present

## 2023-08-09 DIAGNOSIS — C9000 Multiple myeloma not having achieved remission: Secondary | ICD-10-CM | POA: Diagnosis not present

## 2023-08-09 DIAGNOSIS — Z9481 Bone marrow transplant status: Secondary | ICD-10-CM | POA: Diagnosis not present

## 2023-08-09 DIAGNOSIS — M26639 Articular disc disorder of temporomandibular joint, unspecified side: Secondary | ICD-10-CM | POA: Diagnosis not present

## 2023-08-22 ENCOUNTER — Ambulatory Visit: Admitting: Dermatology

## 2023-09-11 ENCOUNTER — Encounter: Payer: Self-pay | Admitting: Hematology & Oncology

## 2023-09-11 ENCOUNTER — Inpatient Hospital Stay

## 2023-09-11 ENCOUNTER — Inpatient Hospital Stay: Attending: Hematology & Oncology

## 2023-09-11 ENCOUNTER — Inpatient Hospital Stay (HOSPITAL_BASED_OUTPATIENT_CLINIC_OR_DEPARTMENT_OTHER): Admitting: Hematology & Oncology

## 2023-09-11 VITALS — BP 125/60 | HR 74 | Resp 18 | Wt 171.0 lb

## 2023-09-11 DIAGNOSIS — C9000 Multiple myeloma not having achieved remission: Secondary | ICD-10-CM | POA: Diagnosis not present

## 2023-09-11 DIAGNOSIS — Z79899 Other long term (current) drug therapy: Secondary | ICD-10-CM | POA: Diagnosis not present

## 2023-09-11 DIAGNOSIS — Z923 Personal history of irradiation: Secondary | ICD-10-CM | POA: Diagnosis not present

## 2023-09-11 DIAGNOSIS — Z885 Allergy status to narcotic agent status: Secondary | ICD-10-CM | POA: Insufficient documentation

## 2023-09-11 DIAGNOSIS — R58 Hemorrhage, not elsewhere classified: Secondary | ICD-10-CM | POA: Insufficient documentation

## 2023-09-11 DIAGNOSIS — M879 Osteonecrosis, unspecified: Secondary | ICD-10-CM | POA: Diagnosis not present

## 2023-09-11 DIAGNOSIS — C541 Malignant neoplasm of endometrium: Secondary | ICD-10-CM

## 2023-09-11 DIAGNOSIS — C9001 Multiple myeloma in remission: Secondary | ICD-10-CM

## 2023-09-11 DIAGNOSIS — H538 Other visual disturbances: Secondary | ICD-10-CM | POA: Diagnosis not present

## 2023-09-11 DIAGNOSIS — Z8542 Personal history of malignant neoplasm of other parts of uterus: Secondary | ICD-10-CM | POA: Insufficient documentation

## 2023-09-11 DIAGNOSIS — D509 Iron deficiency anemia, unspecified: Secondary | ICD-10-CM | POA: Insufficient documentation

## 2023-09-11 DIAGNOSIS — D500 Iron deficiency anemia secondary to blood loss (chronic): Secondary | ICD-10-CM

## 2023-09-11 DIAGNOSIS — Z79811 Long term (current) use of aromatase inhibitors: Secondary | ICD-10-CM | POA: Insufficient documentation

## 2023-09-11 LAB — CBC WITH DIFFERENTIAL (CANCER CENTER ONLY)
Abs Immature Granulocytes: 0.02 K/uL (ref 0.00–0.07)
Basophils Absolute: 0.1 K/uL (ref 0.0–0.1)
Basophils Relative: 2 %
Eosinophils Absolute: 0.1 K/uL (ref 0.0–0.5)
Eosinophils Relative: 3 %
HCT: 28.9 % — ABNORMAL LOW (ref 36.0–46.0)
Hemoglobin: 9.4 g/dL — ABNORMAL LOW (ref 12.0–15.0)
Immature Granulocytes: 1 %
Lymphocytes Relative: 24 %
Lymphs Abs: 0.9 K/uL (ref 0.7–4.0)
MCH: 26.6 pg (ref 26.0–34.0)
MCHC: 32.5 g/dL (ref 30.0–36.0)
MCV: 81.6 fL (ref 80.0–100.0)
Monocytes Absolute: 0.4 K/uL (ref 0.1–1.0)
Monocytes Relative: 11 %
Neutro Abs: 2.2 K/uL (ref 1.7–7.7)
Neutrophils Relative %: 59 %
Platelet Count: 174 K/uL (ref 150–400)
RBC: 3.54 MIL/uL — ABNORMAL LOW (ref 3.87–5.11)
RDW: 17.9 % — ABNORMAL HIGH (ref 11.5–15.5)
WBC Count: 3.7 K/uL — ABNORMAL LOW (ref 4.0–10.5)
nRBC: 0 % (ref 0.0–0.2)

## 2023-09-11 LAB — RETIC PANEL
Immature Retic Fract: 14.1 % (ref 2.3–15.9)
RBC.: 3.54 MIL/uL — ABNORMAL LOW (ref 3.87–5.11)
Retic Count, Absolute: 55.2 K/uL (ref 19.0–186.0)
Retic Ct Pct: 1.6 % (ref 0.4–3.1)
Reticulocyte Hemoglobin: 29.8 pg (ref >27.9)

## 2023-09-11 LAB — IRON AND IRON BINDING CAPACITY (CC-WL,HP ONLY)
Iron: 49 ug/dL (ref 28–170)
Saturation Ratios: 16 % (ref 10.4–31.8)
TIBC: 301 ug/dL (ref 250–450)
UIBC: 252 ug/dL

## 2023-09-11 LAB — CMP (CANCER CENTER ONLY)
ALT: 14 U/L (ref 0–44)
AST: 17 U/L (ref 15–41)
Albumin: 4 g/dL (ref 3.5–5.0)
Alkaline Phosphatase: 61 U/L (ref 38–126)
Anion gap: 13 (ref 5–15)
BUN: 17 mg/dL (ref 8–23)
CO2: 22 mmol/L (ref 22–32)
Calcium: 9.6 mg/dL (ref 8.9–10.3)
Chloride: 106 mmol/L (ref 98–111)
Creatinine: 0.92 mg/dL (ref 0.44–1.00)
GFR, Estimated: >60 mL/min (ref >60)
Glucose, Bld: 127 mg/dL — ABNORMAL HIGH (ref 70–99)
Potassium: 3.3 mmol/L — ABNORMAL LOW (ref 3.5–5.1)
Sodium: 142 mmol/L (ref 135–145)
Total Bilirubin: 0.2 mg/dL (ref 0.0–1.2)
Total Protein: 7.2 g/dL (ref 6.5–8.1)

## 2023-09-11 LAB — LACTATE DEHYDROGENASE: LDH: 157 U/L (ref 98–192)

## 2023-09-11 LAB — FERRITIN: Ferritin: 1412 ng/mL — ABNORMAL HIGH (ref 11–307)

## 2023-09-11 MED ORDER — EPOETIN ALFA-EPBX 40000 UNIT/ML IJ SOLN
40000.0000 [IU] | Freq: Once | INTRAMUSCULAR | Status: AC
  Administered 2023-09-11 (×2): 40000 [IU] via SUBCUTANEOUS
  Filled 2023-09-11: qty 1

## 2023-09-11 NOTE — Patient Instructions (Signed)

## 2023-09-11 NOTE — Progress Notes (Signed)
 She is Hematology and Oncology Follow Up Visit  Emily Greer [id number] 06-05-1950 73 y.o. 09/11/2023   Principle Diagnosis:  Recurrent lambda light chain myeloma - nl cytogenetics History of recurrent endometrial carcinoma  - NED since early 2017 Iron deficiency anemia -blood loss   Past Therapy:             Status post second autologous stem cell transplant on 07/24/2014 Maintenance therapy with Pomalidomide marica 2 week Velcade  - d/c'ed Radiation therapy for endometrial recurrence - completed 04/20/2015 Pomalyst /Kyprolis  70mg /m2 IV q 2 weeks - s/p cycle #12 - held on 12/26/2017 for hematuria Daratumumab /Pomalyst  (1 mg) - status post cycle 19 -- d/c on 08/21/2019 Melflufen 40 mg IV q 4 weeks -- started on 08/27/2019, s/p cycle #2 --  D/c due to FDA removal Selinexor  60 mg po q week -- start on 01/19/2020 -- changed on 03/18/2020 -- d/c on 04/27/2020 Blenrep  2.5 mg/m2 IV q 3 weeks -- started on 05/20/2020, s/p cycle #7 --D/C secondary to progression on 12/22/2020 Carfilzomib /Sarclisa  -- s/p cycle #3 -- start on 01/03/2021 Cytoxan /Pomalyst /decadron  -- start cycle #1 on 06/15/2021 - d/c 06/2021 S/p Cilta-cel CAR-T infusion -- 07/22/2021 Xgeva - D/c following 07/31/20224 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 Shell is here today for follow-up.  Abrol that she is having his dental.  She saw a dentist in [facility].  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  Again, less sure what would be done for this outside of many this heal up over time.  Her myeloma seems to be doing quite well.  She seems to be in remission.  I do not think she really has to go down to see the doctors at [place].  When we saw her in April, there is  no monoclonal spike in her blood.  Her lambda light chain level was 2.7 mg/dL.  She has had no issues with respect to the endometrial carcinoma.  I think she is on letrozole  for this.  She is followed by gynecologic oncology for this.  Her appetite has been good.  She has had no nausea or vomiting.  She has had no change in bowel bladder habits.  She has had no bleeding.  There has been no leg swelling.  She has had no rashes.  She has had no headache.  Overall, I would have to say that her performance status is ECOG 0.   Wt Readings from Last 3 Encounters:  09/11/23 171 lb (77.6 kg)  07/12/23 174 lb 11.2 oz (79.2 kg)  06/26/23 173 lb 12.8 oz (78.8 kg)   Medications:  Allergies as of 09/11/2023       Reactions   Codeine Nausea Only        Medication List        Accurate as of September 11, 2023 12:00 PM. If you have any questions, ask your nurse or doctor.          acyclovir  400 MG tablet Commonly known as: ZOVIRAX  TAKE 1 TABLET(400 MG) BY MOUTH TWICE DAILY   albuterol  108 (90 Base) MCG/ACT inhaler Commonly known as: VENTOLIN  HFA Inhale 2 puffs into the lungs every 6 (six) hours as needed for  wheezing or shortness of breath. 2 puffs 3 times daily x 5 days then every 6 hours as needed.   amLODipine  10 MG tablet Commonly known as: NORVASC  TAKE 1 TABLET(10 MG) BY MOUTH EVERY MORNING   aspirin EC 81 MG tablet Take 81 mg by mouth daily.   benzonatate  100 MG capsule Commonly known as: Tessalon  Perles Take 1 capsule (100 mg total) by mouth 3 (three) times daily as needed for cough.   calcium-vitamin D  500-5 MG-MCG tablet Commonly known as: OSCAL WITH D Take 1 tablet by mouth daily.   chlorhexidine 0.12 % solution Commonly known as: PERIDEX 10 mLs 2 (two) times daily.   letrozole  2.5 MG tablet Commonly known as: FEMARA  Take 1 tablet (2.5 mg total) by mouth daily.   levocetirizine 5 MG tablet Commonly known as: Xyzal  Allergy 24HR Take 1 tablet (5 mg total) by  mouth every evening.   metoprolol  tartrate 50 MG tablet Commonly known as: LOPRESSOR  Take 1/2 tablet by mouth twice daily   potassium chloride  SA 20 MEQ tablet Commonly known as: KLOR-CON  M TAKE 1 TABLET(20 MEQ) BY MOUTH TWICE DAILY   Safety Seal Miscellaneous Misc Apply 1 Application topically in the morning. Medication Name: Hormonic Hair Solution (Mioxidil 7%, Finasteride 0.05%)   triamcinolone  55 MCG/ACT Aero nasal inhaler Commonly known as: NASACORT  Place 1 spray into the nose 2 (two) times daily.   Vitamin D3 50 MCG (2000 UT) Tabs Take 2 tablets by mouth daily.        Allergies:  Allergies  Allergen Reactions   Codeine Nausea Only    Past Medical History, Surgical history, Social history, and Family History were reviewed and updated.  Review of Systems: Review of Systems  Constitutional: Negative.   HENT: Negative.    Eyes:  Positive for blurred vision.  Respiratory: Negative.    Cardiovascular: Negative.   Gastrointestinal: Negative.   Genitourinary: Negative.   Musculoskeletal: Negative.   Skin: Negative.   Neurological: Negative.   Endo/Heme/Allergies: Negative.   Psychiatric/Behavioral: Negative.     Physical Exam:  Her vital signs are temperature of 98.2.  Pulse 74.  Blood pressure 125/60.  Weight is 171 pounds.     Wt Readings from Last 3 Encounters:  09/11/23 171 lb (77.6 kg)  07/12/23 174 lb 11.2 oz (79.2 kg)  06/26/23 173 lb 12.8 oz (78.8 kg)    Physical Exam Vitals reviewed.  HENT:     Head: Normocephalic and atraumatic.  Eyes:     Pupils: Pupils are equal, round, and reactive to light.  Cardiovascular:     Rate and Rhythm: Normal rate and regular rhythm.     Heart sounds: Normal heart sounds.  Pulmonary:     Effort: Pulmonary effort is normal.     Breath sounds: Normal breath sounds.  Abdominal:     General: Bowel sounds are normal.     Palpations: Abdomen is soft.  Musculoskeletal:        General: No tenderness or deformity.  Normal range of motion.     Cervical back: Normal range of motion.  Lymphadenopathy:     Cervical: No cervical adenopathy.  Skin:    General: Skin is warm and dry.     Findings: No erythema or rash.  Neurological:     Mental Status: She is alert and oriented to person, place, and time.  Psychiatric:        Behavior: Behavior normal.        Thought Content: Thought content normal.  Judgment: Judgment normal.      Lab Results  Component Value Date   WBC 3.7 (L) 09/11/2023   HGB 9.4 (L) 09/11/2023   HCT 28.9 (L) 09/11/2023   MCV 81.6 09/11/2023   PLT 174 09/11/2023   Lab Results  Component Value Date   FERRITIN 1,662 (H) 07/12/2023   IRON 74 07/12/2023   TIBC 311 07/12/2023   UIBC 237 07/12/2023   IRONPCTSAT 24 07/12/2023   Lab Results  Component Value Date   RETICCTPCT 1.6 09/11/2023   RBC 3.54 (L) 09/11/2023   RBC 3.54 (L) 09/11/2023   Lab Results  Component Value Date   KPAFRELGTCHN 25.8 (H) 07/12/2023   LAMBDASER 26.6 (H) 07/12/2023   KAPLAMBRATIO 0.97 07/12/2023   Lab Results  Component Value Date   IGGSERUM 1,421 07/12/2023   IGA 61 (L) 07/12/2023   IGMSERUM 59 07/12/2023   Lab Results  Component Value Date   TOTALPROTELP 6.7 05/24/2023   ALBUMINELP 3.4 08/30/2022   A1GS 0.3 08/30/2022   A2GS 0.9 08/30/2022   BETS 1.0 08/30/2022   BETA2SER 0.4 11/23/2014   GAMS 0.6 08/30/2022   MSPIKE Not Observed 08/30/2022   SPEI Comment 10/19/2020     Chemistry      Component Value Date/Time   NA 142 09/11/2023 0924   NA 142 07/04/2022 1248   NA 141 01/10/2017 1115   NA 140 06/21/2016 0918   K 3.3 (L) 09/11/2023 0924   K 4.0 01/10/2017 1115   K 4.3 06/21/2016 0918   CL 106 09/11/2023 0924   CL 106 01/10/2017 1115   CO2 22 09/11/2023 0924   CO2 27 01/10/2017 1115   CO2 20 (L) 06/21/2016 0918   BUN 17 09/11/2023 0924   BUN 12 07/04/2022 1248   BUN 15 01/10/2017 1115   BUN 15.8 06/21/2016 0918   CREATININE 0.92 09/11/2023 0924   CREATININE  1.0 01/10/2017 1115   CREATININE 0.8 06/21/2016 0918      Component Value Date/Time   CALCIUM 9.6 09/11/2023 0924   CALCIUM 9.5 01/10/2017 1115   CALCIUM 9.4 06/21/2016 0918   ALKPHOS 61 09/11/2023 0924   ALKPHOS 40 01/10/2017 1115   ALKPHOS 66 06/21/2016 0918   AST 17 09/11/2023 0924   AST 17 06/21/2016 0918   ALT 14 09/11/2023 0924   ALT 19 01/10/2017 1115   ALT 37 06/21/2016 0918   BILITOT 0.2 09/11/2023 0924   BILITOT 0.32 06/21/2016 0918       Impression and Plan: Ms. Footman is a very pleasant 73 yo African American female with recurrent lambda light chain myeloma.  She had her second stem cell transplant for light chain myeloma was in June 2016.   She then underwent a CAR-T infusion about 2 years ago.  Again she has done incredibly well with this.  I still think she has an remission.  She will get her Retacrit  today.  We will still plan to get her back to see us  in another few months.  Maude JONELLE Crease, MD 8/12/202512:00 PM

## 2023-09-11 NOTE — Progress Notes (Signed)
 She is Hematology and Oncology Follow Up Visit  CHANIAH CISSE [id number] 06-05-1950 73 y.o. 09/11/2023   Principle Diagnosis:  Recurrent lambda light chain myeloma - nl cytogenetics History of recurrent endometrial carcinoma  - NED since early 2017 Iron deficiency anemia -blood loss   Past Therapy:             Status post second autologous stem cell transplant on 07/24/2014 Maintenance therapy with Pomalidomide marica 2 week Velcade  - d/c'ed Radiation therapy for endometrial recurrence - completed 04/20/2015 Pomalyst /Kyprolis  70mg /m2 IV q 2 weeks - s/p cycle #12 - held on 12/26/2017 for hematuria Daratumumab /Pomalyst  (1 mg) - status post cycle 19 -- d/c on 08/21/2019 Melflufen 40 mg IV q 4 weeks -- started on 08/27/2019, s/p cycle #2 --  D/c due to FDA removal Selinexor  60 mg po q week -- start on 01/19/2020 -- changed on 03/18/2020 -- d/c on 04/27/2020 Blenrep  2.5 mg/m2 IV q 3 weeks -- started on 05/20/2020, s/p cycle #7 --D/C secondary to progression on 12/22/2020 Carfilzomib /Sarclisa  -- s/p cycle #3 -- start on 01/03/2021 Cytoxan /Pomalyst /decadron  -- start cycle #1 on 06/15/2021 - d/c 06/2021 S/p Cilta-cel CAR-T infusion -- 07/22/2021 Xgeva - D/c following 07/31/20224 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 Shell is here today for follow-up.  Abrol that she is having his dental.  She saw a dentist in [facility].  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  Again, less sure what would be done for this outside of many this heal up over time.  Her myeloma seems to be doing quite well.  She seems to be in remission.  I do not think she really has to go down to see the doctors at [place].  When we saw her in April, there is  no monoclonal spike in her blood.  Her lambda light chain level was 2.7 mg/dL.  She has had no issues with respect to the endometrial carcinoma.  I think she is on letrozole  for this.  She is followed by gynecologic oncology for this.  Her appetite has been good.  She has had no nausea or vomiting.  She has had no change in bowel bladder habits.  She has had no bleeding.  There has been no leg swelling.  She has had no rashes.  She has had no headache.  Overall, I would have to say that her performance status is ECOG 0.   Wt Readings from Last 3 Encounters:  09/11/23 171 lb (77.6 kg)  07/12/23 174 lb 11.2 oz (79.2 kg)  06/26/23 173 lb 12.8 oz (78.8 kg)   Medications:  Allergies as of 09/11/2023       Reactions   Codeine Nausea Only        Medication List        Accurate as of September 11, 2023 12:00 PM. If you have any questions, ask your nurse or doctor.          acyclovir  400 MG tablet Commonly known as: ZOVIRAX  TAKE 1 TABLET(400 MG) BY MOUTH TWICE DAILY   albuterol  108 (90 Base) MCG/ACT inhaler Commonly known as: VENTOLIN  HFA Inhale 2 puffs into the lungs every 6 (six) hours as needed for  wheezing or shortness of breath. 2 puffs 3 times daily x 5 days then every 6 hours as needed.   amLODipine  10 MG tablet Commonly known as: NORVASC  TAKE 1 TABLET(10 MG) BY MOUTH EVERY MORNING   aspirin EC 81 MG tablet Take 81 mg by mouth daily.   benzonatate  100 MG capsule Commonly known as: Tessalon  Perles Take 1 capsule (100 mg total) by mouth 3 (three) times daily as needed for cough.   calcium-vitamin D  500-5 MG-MCG tablet Commonly known as: OSCAL WITH D Take 1 tablet by mouth daily.   chlorhexidine 0.12 % solution Commonly known as: PERIDEX 10 mLs 2 (two) times daily.   letrozole  2.5 MG tablet Commonly known as: FEMARA  Take 1 tablet (2.5 mg total) by mouth daily.   levocetirizine 5 MG tablet Commonly known as: Xyzal  Allergy 24HR Take 1 tablet (5 mg total) by  mouth every evening.   metoprolol  tartrate 50 MG tablet Commonly known as: LOPRESSOR  Take 1/2 tablet by mouth twice daily   potassium chloride  SA 20 MEQ tablet Commonly known as: KLOR-CON  M TAKE 1 TABLET(20 MEQ) BY MOUTH TWICE DAILY   Safety Seal Miscellaneous Misc Apply 1 Application topically in the morning. Medication Name: Hormonic Hair Solution (Mioxidil 7%, Finasteride 0.05%)   triamcinolone  55 MCG/ACT Aero nasal inhaler Commonly known as: NASACORT  Place 1 spray into the nose 2 (two) times daily.   Vitamin D3 50 MCG (2000 UT) Tabs Take 2 tablets by mouth daily.        Allergies:  Allergies  Allergen Reactions   Codeine Nausea Only    Past Medical History, Surgical history, Social history, and Family History were reviewed and updated.  Review of Systems: Review of Systems  Constitutional: Negative.   HENT: Negative.    Eyes:  Positive for blurred vision.  Respiratory: Negative.    Cardiovascular: Negative.   Gastrointestinal: Negative.   Genitourinary: Negative.   Musculoskeletal: Negative.   Skin: Negative.   Neurological: Negative.   Endo/Heme/Allergies: Negative.   Psychiatric/Behavioral: Negative.     Physical Exam:  Her vital signs are temperature of 98.2.  Pulse 74.  Blood pressure 125/60.  Weight is 171 pounds.     Wt Readings from Last 3 Encounters:  09/11/23 171 lb (77.6 kg)  07/12/23 174 lb 11.2 oz (79.2 kg)  06/26/23 173 lb 12.8 oz (78.8 kg)    Physical Exam Vitals reviewed.  HENT:     Head: Normocephalic and atraumatic.  Eyes:     Pupils: Pupils are equal, round, and reactive to light.  Cardiovascular:     Rate and Rhythm: Normal rate and regular rhythm.     Heart sounds: Normal heart sounds.  Pulmonary:     Effort: Pulmonary effort is normal.     Breath sounds: Normal breath sounds.  Abdominal:     General: Bowel sounds are normal.     Palpations: Abdomen is soft.  Musculoskeletal:        General: No tenderness or deformity.  Normal range of motion.     Cervical back: Normal range of motion.  Lymphadenopathy:     Cervical: No cervical adenopathy.  Skin:    General: Skin is warm and dry.     Findings: No erythema or rash.  Neurological:     Mental Status: She is alert and oriented to person, place, and time.  Psychiatric:        Behavior: Behavior normal.        Thought Content: Thought content normal.  Judgment: Judgment normal.      Lab Results  Component Value Date   WBC 3.7 (L) 09/11/2023   HGB 9.4 (L) 09/11/2023   HCT 28.9 (L) 09/11/2023   MCV 81.6 09/11/2023   PLT 174 09/11/2023   Lab Results  Component Value Date   FERRITIN 1,662 (H) 07/12/2023   IRON 74 07/12/2023   TIBC 311 07/12/2023   UIBC 237 07/12/2023   IRONPCTSAT 24 07/12/2023   Lab Results  Component Value Date   RETICCTPCT 1.6 09/11/2023   RBC 3.54 (L) 09/11/2023   RBC 3.54 (L) 09/11/2023   Lab Results  Component Value Date   KPAFRELGTCHN 25.8 (H) 07/12/2023   LAMBDASER 26.6 (H) 07/12/2023   KAPLAMBRATIO 0.97 07/12/2023   Lab Results  Component Value Date   IGGSERUM 1,421 07/12/2023   IGA 61 (L) 07/12/2023   IGMSERUM 59 07/12/2023   Lab Results  Component Value Date   TOTALPROTELP 6.7 05/24/2023   ALBUMINELP 3.4 08/30/2022   A1GS 0.3 08/30/2022   A2GS 0.9 08/30/2022   BETS 1.0 08/30/2022   BETA2SER 0.4 11/23/2014   GAMS 0.6 08/30/2022   MSPIKE Not Observed 08/30/2022   SPEI Comment 10/19/2020     Chemistry      Component Value Date/Time   NA 142 09/11/2023 0924   NA 142 07/04/2022 1248   NA 141 01/10/2017 1115   NA 140 06/21/2016 0918   K 3.3 (L) 09/11/2023 0924   K 4.0 01/10/2017 1115   K 4.3 06/21/2016 0918   CL 106 09/11/2023 0924   CL 106 01/10/2017 1115   CO2 22 09/11/2023 0924   CO2 27 01/10/2017 1115   CO2 20 (L) 06/21/2016 0918   BUN 17 09/11/2023 0924   BUN 12 07/04/2022 1248   BUN 15 01/10/2017 1115   BUN 15.8 06/21/2016 0918   CREATININE 0.92 09/11/2023 0924   CREATININE  1.0 01/10/2017 1115   CREATININE 0.8 06/21/2016 0918      Component Value Date/Time   CALCIUM 9.6 09/11/2023 0924   CALCIUM 9.5 01/10/2017 1115   CALCIUM 9.4 06/21/2016 0918   ALKPHOS 61 09/11/2023 0924   ALKPHOS 40 01/10/2017 1115   ALKPHOS 66 06/21/2016 0918   AST 17 09/11/2023 0924   AST 17 06/21/2016 0918   ALT 14 09/11/2023 0924   ALT 19 01/10/2017 1115   ALT 37 06/21/2016 0918   BILITOT 0.2 09/11/2023 0924   BILITOT 0.32 06/21/2016 0918       Impression and Plan: Ms. Footman is a very pleasant 73 yo African American female with recurrent lambda light chain myeloma.  She had her second stem cell transplant for light chain myeloma was in June 2016.   She then underwent a CAR-T infusion about 2 years ago.  Again she has done incredibly well with this.  I still think she has an remission.  She will get her Retacrit  today.  We will still plan to get her back to see us  in another few months.  Maude JONELLE Crease, MD 8/12/202512:00 PM

## 2023-09-11 NOTE — Patient Instructions (Signed)

## 2023-09-12 DIAGNOSIS — Z9481 Bone marrow transplant status: Secondary | ICD-10-CM | POA: Diagnosis not present

## 2023-09-12 DIAGNOSIS — M87180 Osteonecrosis due to drugs, jaw: Secondary | ICD-10-CM | POA: Diagnosis not present

## 2023-09-12 DIAGNOSIS — K06010 Localized gingival recession, unspecified: Secondary | ICD-10-CM | POA: Diagnosis not present

## 2023-09-12 DIAGNOSIS — M26639 Articular disc disorder of temporomandibular joint, unspecified side: Secondary | ICD-10-CM | POA: Diagnosis not present

## 2023-09-12 DIAGNOSIS — C9000 Multiple myeloma not having achieved remission: Secondary | ICD-10-CM | POA: Diagnosis not present

## 2023-09-12 LAB — KAPPA/LAMBDA LIGHT CHAINS
Kappa free light chain: 23.4 mg/L — ABNORMAL HIGH (ref 3.3–19.4)
Kappa, lambda light chain ratio: 0.96 (ref 0.26–1.65)
Lambda free light chains: 24.4 mg/L (ref 5.7–26.3)

## 2023-09-13 LAB — MULTIPLE MYELOMA PANEL, SERUM
Albumin SerPl Elph-Mcnc: 3.4 g/dL (ref 2.9–4.4)
Albumin/Glob SerPl: 1 (ref 0.7–1.7)
Alpha 1: 0.3 g/dL (ref 0.0–0.4)
Alpha2 Glob SerPl Elph-Mcnc: 0.9 g/dL (ref 0.4–1.0)
B-Globulin SerPl Elph-Mcnc: 1 g/dL (ref 0.7–1.3)
Gamma Glob SerPl Elph-Mcnc: 1.4 g/dL (ref 0.4–1.8)
Globulin, Total: 3.5 g/dL (ref 2.2–3.9)
IgA: 79 mg/dL (ref 64–422)
IgG (Immunoglobin G), Serum: 1520 mg/dL (ref 586–1602)
IgM (Immunoglobulin M), Srm: 63 mg/dL (ref 26–217)
Total Protein ELP: 6.9 g/dL (ref 6.0–8.5)

## 2023-10-03 ENCOUNTER — Inpatient Hospital Stay

## 2023-10-03 ENCOUNTER — Ambulatory Visit: Payer: Self-pay | Admitting: Hematology & Oncology

## 2023-10-03 ENCOUNTER — Inpatient Hospital Stay: Attending: Hematology & Oncology

## 2023-10-03 DIAGNOSIS — H538 Other visual disturbances: Secondary | ICD-10-CM | POA: Diagnosis not present

## 2023-10-03 DIAGNOSIS — D509 Iron deficiency anemia, unspecified: Secondary | ICD-10-CM | POA: Insufficient documentation

## 2023-10-03 DIAGNOSIS — C541 Malignant neoplasm of endometrium: Secondary | ICD-10-CM | POA: Insufficient documentation

## 2023-10-03 DIAGNOSIS — Z79899 Other long term (current) drug therapy: Secondary | ICD-10-CM | POA: Insufficient documentation

## 2023-10-03 DIAGNOSIS — C9000 Multiple myeloma not having achieved remission: Secondary | ICD-10-CM | POA: Diagnosis present

## 2023-10-03 DIAGNOSIS — C9001 Multiple myeloma in remission: Secondary | ICD-10-CM

## 2023-10-03 LAB — CMP (CANCER CENTER ONLY)
ALT: 15 U/L (ref 0–44)
AST: 17 U/L (ref 15–41)
Albumin: 4.1 g/dL (ref 3.5–5.0)
Alkaline Phosphatase: 69 U/L (ref 38–126)
Anion gap: 11 (ref 5–15)
BUN: 17 mg/dL (ref 8–23)
CO2: 25 mmol/L (ref 22–32)
Calcium: 9.8 mg/dL (ref 8.9–10.3)
Chloride: 103 mmol/L (ref 98–111)
Creatinine: 0.94 mg/dL (ref 0.44–1.00)
GFR, Estimated: >60 mL/min (ref >60)
Glucose, Bld: 115 mg/dL — ABNORMAL HIGH (ref 70–99)
Potassium: 3.5 mmol/L (ref 3.5–5.1)
Sodium: 140 mmol/L (ref 135–145)
Total Bilirubin: 0.3 mg/dL (ref 0.0–1.2)
Total Protein: 7.5 g/dL (ref 6.5–8.1)

## 2023-10-03 LAB — CBC WITH DIFFERENTIAL (CANCER CENTER ONLY)
Abs Immature Granulocytes: 0.02 K/uL (ref 0.00–0.07)
Basophils Absolute: 0.1 K/uL (ref 0.0–0.1)
Basophils Relative: 2 %
Eosinophils Absolute: 0.2 K/uL (ref 0.0–0.5)
Eosinophils Relative: 5 %
HCT: 29.1 % — ABNORMAL LOW (ref 36.0–46.0)
Hemoglobin: 9.3 g/dL — ABNORMAL LOW (ref 12.0–15.0)
Immature Granulocytes: 1 %
Lymphocytes Relative: 25 %
Lymphs Abs: 0.9 K/uL (ref 0.7–4.0)
MCH: 26 pg (ref 26.0–34.0)
MCHC: 32 g/dL (ref 30.0–36.0)
MCV: 81.3 fL (ref 80.0–100.0)
Monocytes Absolute: 0.4 K/uL (ref 0.1–1.0)
Monocytes Relative: 10 %
Neutro Abs: 2.1 K/uL (ref 1.7–7.7)
Neutrophils Relative %: 57 %
Platelet Count: 196 K/uL (ref 150–400)
RBC: 3.58 MIL/uL — ABNORMAL LOW (ref 3.87–5.11)
RDW: 18.4 % — ABNORMAL HIGH (ref 11.5–15.5)
Smear Review: NORMAL
WBC Count: 3.6 K/uL — ABNORMAL LOW (ref 4.0–10.5)
nRBC: 0 % (ref 0.0–0.2)

## 2023-10-03 LAB — FERRITIN: Ferritin: 1032 ng/mL — ABNORMAL HIGH (ref 11–307)

## 2023-10-03 LAB — LACTATE DEHYDROGENASE: LDH: 164 U/L (ref 98–192)

## 2023-10-03 LAB — IRON AND IRON BINDING CAPACITY (CC-WL,HP ONLY)
Iron: 47 ug/dL (ref 28–170)
Saturation Ratios: 15 % (ref 10.4–31.8)
TIBC: 308 ug/dL (ref 250–450)
UIBC: 261 ug/dL

## 2023-10-03 MED ORDER — EPOETIN ALFA-EPBX 40000 UNIT/ML IJ SOLN
40000.0000 [IU] | Freq: Once | INTRAMUSCULAR | Status: AC
  Administered 2023-10-03: 40000 [IU] via SUBCUTANEOUS
  Filled 2023-10-03: qty 1

## 2023-10-03 NOTE — Patient Instructions (Signed)

## 2023-10-03 NOTE — Patient Instructions (Signed)

## 2023-10-04 LAB — KAPPA/LAMBDA LIGHT CHAINS
Kappa free light chain: 28.1 mg/L — ABNORMAL HIGH (ref 3.3–19.4)
Kappa, lambda light chain ratio: 1.03 (ref 0.26–1.65)
Lambda free light chains: 27.4 mg/L — ABNORMAL HIGH (ref 5.7–26.3)

## 2023-10-04 LAB — PROTEIN ELECTROPHORESIS, SERUM, WITH REFLEX
A/G Ratio: 0.9 (ref 0.7–1.7)
Albumin ELP: 3.3 g/dL (ref 2.9–4.4)
Alpha-1-Globulin: 0.3 g/dL (ref 0.0–0.4)
Alpha-2-Globulin: 0.9 g/dL (ref 0.4–1.0)
Beta Globulin: 1 g/dL (ref 0.7–1.3)
Gamma Globulin: 1.5 g/dL (ref 0.4–1.8)
Globulin, Total: 3.7 g/dL (ref 2.2–3.9)
Total Protein ELP: 7 g/dL (ref 6.0–8.5)

## 2023-10-04 LAB — IGG, IGA, IGM
IgA: 92 mg/dL (ref 64–422)
IgG (Immunoglobin G), Serum: 1537 mg/dL (ref 586–1602)
IgM (Immunoglobulin M), Srm: 62 mg/dL (ref 26–217)

## 2023-10-05 ENCOUNTER — Inpatient Hospital Stay

## 2023-10-05 VITALS — BP 151/72 | HR 68 | Temp 98.3°F | Resp 18

## 2023-10-05 DIAGNOSIS — C9000 Multiple myeloma not having achieved remission: Secondary | ICD-10-CM

## 2023-10-05 DIAGNOSIS — H538 Other visual disturbances: Secondary | ICD-10-CM | POA: Diagnosis not present

## 2023-10-05 DIAGNOSIS — D509 Iron deficiency anemia, unspecified: Secondary | ICD-10-CM | POA: Diagnosis not present

## 2023-10-05 DIAGNOSIS — C541 Malignant neoplasm of endometrium: Secondary | ICD-10-CM | POA: Diagnosis not present

## 2023-10-05 DIAGNOSIS — C9001 Multiple myeloma in remission: Secondary | ICD-10-CM

## 2023-10-05 DIAGNOSIS — Z79899 Other long term (current) drug therapy: Secondary | ICD-10-CM | POA: Diagnosis not present

## 2023-10-05 MED ORDER — SODIUM CHLORIDE 0.9 % IV SOLN
510.0000 mg | Freq: Once | INTRAVENOUS | Status: AC
  Administered 2023-10-05: 510 mg via INTRAVENOUS
  Filled 2023-10-05: qty 17

## 2023-10-05 MED ORDER — SODIUM CHLORIDE 0.9 % IV SOLN: Freq: Once | INTRAVENOUS | Status: AC

## 2023-10-05 NOTE — Patient Instructions (Signed)
 [facility] - A DEPT OF [facility]  Discharge Instructions: Thank you for choosing [facility] to provide your oncology and hematology care.   If you have a lab appointment with the Cancer Center, please go directly to the Cancer Center and check in at the registration area.  Wear comfortable clothing and clothing appropriate for easy access to any Portacath or PICC line.   We strive to give you quality time with your provider. You may need to reschedule your appointment if you arrive late (15 or more minutes).  Arriving late affects you and other patients whose appointments are after yours.  Also, if you miss three or more appointments without notifying the office, you may be dismissed from the clinic at the provider's discretion.      For prescription refill requests, have your pharmacy contact our office and allow 72 hours for refills to be completed.    Today you received the following chemotherapy and/or immunotherapy agents: Ferumoxytol .   To help prevent nausea and vomiting after your treatment, we encourage you to take your nausea medication as directed.  BELOW ARE SYMPTOMS THAT SHOULD BE REPORTED IMMEDIATELY: *FEVER GREATER THAN 100.4 F (38 C) OR HIGHER *CHILLS OR SWEATING *NAUSEA AND VOMITING THAT IS NOT CONTROLLED WITH YOUR NAUSEA MEDICATION *UNUSUAL SHORTNESS OF BREATH *UNUSUAL BRUISING OR BLEEDING *URINARY PROBLEMS (pain or burning when urinating, or frequent urination) *BOWEL PROBLEMS (unusual diarrhea, constipation, pain near the anus) TENDERNESS IN MOUTH AND THROAT WITH OR WITHOUT PRESENCE OF ULCERS (sore throat, sores in mouth, or a toothache) UNUSUAL RASH, SWELLING OR PAIN  UNUSUAL VAGINAL DISCHARGE OR ITCHING   Items with * indicate a potential emergency and should be followed up as soon as possible or go to the Emergency Department if any problems should occur.  Please show the CHEMOTHERAPY ALERT CARD or IMMUNOTHERAPY  ALERT CARD at check-in to the Emergency Department and triage nurse. Should you have questions after your visit or need to cancel or reschedule your appointment, please contact [facility] - A DEPT OF [facility]  [phone] and follow the prompts.  Office hours are 8:00 a.m. to 4:30 p.m. Monday - Friday. Please note that voicemails left after 4:00 p.m. may not be returned until the following business day.  We are closed weekends and major holidays. You have access to a nurse at all times for urgent questions. Please call the main number to the clinic [phone] and follow the prompts.  For any non-urgent questions, you may also contact your provider using MyChart. We now offer e-Visits for anyone 31 and older to request care online for non-urgent symptoms. For details visit [email or web address].   Also download the MyChart app! Go to the app store, search MyChart, open the app, select [facility], and log in with your MyChart username and password.

## 2023-10-12 ENCOUNTER — Inpatient Hospital Stay

## 2023-10-12 VITALS — BP 127/62 | HR 80 | Temp 98.2°F

## 2023-10-12 DIAGNOSIS — C541 Malignant neoplasm of endometrium: Secondary | ICD-10-CM | POA: Diagnosis not present

## 2023-10-12 DIAGNOSIS — C9000 Multiple myeloma not having achieved remission: Secondary | ICD-10-CM | POA: Diagnosis not present

## 2023-10-12 DIAGNOSIS — H538 Other visual disturbances: Secondary | ICD-10-CM | POA: Diagnosis not present

## 2023-10-12 DIAGNOSIS — D509 Iron deficiency anemia, unspecified: Secondary | ICD-10-CM | POA: Diagnosis not present

## 2023-10-12 DIAGNOSIS — Z79899 Other long term (current) drug therapy: Secondary | ICD-10-CM | POA: Diagnosis not present

## 2023-10-12 DIAGNOSIS — C9001 Multiple myeloma in remission: Secondary | ICD-10-CM

## 2023-10-12 MED ORDER — SODIUM CHLORIDE 0.9 % IV SOLN
510.0000 mg | Freq: Once | INTRAVENOUS | Status: AC
  Administered 2023-10-12: 510 mg via INTRAVENOUS
  Filled 2023-10-12: qty 17

## 2023-10-12 MED ORDER — SODIUM CHLORIDE 0.9 % IV SOLN: Freq: Once | INTRAVENOUS | Status: AC

## 2023-10-12 NOTE — Patient Instructions (Signed)

## 2023-10-18 ENCOUNTER — Ambulatory Visit: Payer: Self-pay

## 2023-10-18 NOTE — Telephone Encounter (Signed)
 FYI Only or Action Required?: FYI only for provider.  Patient was last seen in primary care on 06/26/2023 by Jarold Medici, MD.  Called Nurse Triage reporting Nasal Congestion and Allergies.  Symptoms began a while.  Interventions attempted: OTC medications: zyrtec .  Symptoms are: unchanged.  Triage Disposition: See PCP When Office is Open (Within 3 Days)  Patient/caregiver understands and will follow disposition?: Yes       Copied from CRM [id number]. Topic: Clinical - Prescription Issue >> Oct 18, 2023  9:56 AM Charlet HERO wrote: Reason for CRM: Patient is calling about Zyrtec  that is not working for her she is wanting to get a stronger prescription called in for her, she has appt set for next month states that he nose is running on one Reason for Disposition  [1] Taking antihistamines or using nasal steroids > 7 days AND [2] nasal allergy symptoms not getting better (not improving)  Answer Assessment - Initial Assessment Questions 1. SYMPTOM: What's the main symptom you're concerned about? (e.g., runny nose, stuffiness, sneezing, itching)     Draining on R nare, congestion 2. SEVERITY: How bad is it? What does it keep you from doing? (e.g., sleeping, working)      Nasal drainage causes congestion 3. EYES: Are your eyes also red, watery, and itchy?      denies 4. TRIGGER: What pollen or other allergic substance do you think is causing the symptoms?      unknown 5. TREATMENT: What medicine are you using? What medicine worked best in the past?     Zyrtec , not improving sx 6. OTHER SYMPTOMS: Do you have any other symptoms? (e.g., coughing, difficulty breathing, wheezing)     congestion 7. PREGNANCY: Is there any chance you are pregnant? When was your last menstrual period?     N/a  Protocols used: Nasal Allergies (Hay Fever)-A-AH

## 2023-10-22 ENCOUNTER — Other Ambulatory Visit: Payer: Self-pay | Admitting: *Deleted

## 2023-10-22 DIAGNOSIS — C9000 Multiple myeloma not having achieved remission: Secondary | ICD-10-CM

## 2023-10-23 ENCOUNTER — Inpatient Hospital Stay

## 2023-10-23 VITALS — BP 131/66 | HR 76 | Temp 97.2°F | Resp 18

## 2023-10-23 DIAGNOSIS — Z79899 Other long term (current) drug therapy: Secondary | ICD-10-CM | POA: Diagnosis not present

## 2023-10-23 DIAGNOSIS — H538 Other visual disturbances: Secondary | ICD-10-CM | POA: Diagnosis not present

## 2023-10-23 DIAGNOSIS — C541 Malignant neoplasm of endometrium: Secondary | ICD-10-CM

## 2023-10-23 DIAGNOSIS — C9001 Multiple myeloma in remission: Secondary | ICD-10-CM

## 2023-10-23 DIAGNOSIS — C9000 Multiple myeloma not having achieved remission: Secondary | ICD-10-CM

## 2023-10-23 DIAGNOSIS — D509 Iron deficiency anemia, unspecified: Secondary | ICD-10-CM | POA: Diagnosis not present

## 2023-10-23 LAB — CMP (CANCER CENTER ONLY)
ALT: 13 U/L (ref 0–44)
AST: 17 U/L (ref 15–41)
Albumin: 4 g/dL (ref 3.5–5.0)
Alkaline Phosphatase: 68 U/L (ref 38–126)
Anion gap: 11 (ref 5–15)
BUN: 15 mg/dL (ref 8–23)
CO2: 25 mmol/L (ref 22–32)
Calcium: 9.6 mg/dL (ref 8.9–10.3)
Chloride: 104 mmol/L (ref 98–111)
Creatinine: 0.82 mg/dL (ref 0.44–1.00)
GFR, Estimated: >60 mL/min (ref >60)
Glucose, Bld: 99 mg/dL (ref 70–99)
Potassium: 3.7 mmol/L (ref 3.5–5.1)
Sodium: 141 mmol/L (ref 135–145)
Total Bilirubin: 0.3 mg/dL (ref 0.0–1.2)
Total Protein: 7.6 g/dL (ref 6.5–8.1)

## 2023-10-23 LAB — CBC WITH DIFFERENTIAL (CANCER CENTER ONLY)
Abs Immature Granulocytes: 0.02 K/uL (ref 0.00–0.07)
Basophils Absolute: 0.1 K/uL (ref 0.0–0.1)
Basophils Relative: 1 %
Eosinophils Absolute: 0.2 K/uL (ref 0.0–0.5)
Eosinophils Relative: 5 %
HCT: 31.8 % — ABNORMAL LOW (ref 36.0–46.0)
Hemoglobin: 10.2 g/dL — ABNORMAL LOW (ref 12.0–15.0)
Immature Granulocytes: 1 %
Lymphocytes Relative: 26 %
Lymphs Abs: 0.9 K/uL (ref 0.7–4.0)
MCH: 26.6 pg (ref 26.0–34.0)
MCHC: 32.1 g/dL (ref 30.0–36.0)
MCV: 83 fL (ref 80.0–100.0)
Monocytes Absolute: 0.5 K/uL (ref 0.1–1.0)
Monocytes Relative: 13 %
Neutro Abs: 2 K/uL (ref 1.7–7.7)
Neutrophils Relative %: 54 %
Platelet Count: 182 K/uL (ref 150–400)
RBC: 3.83 MIL/uL — ABNORMAL LOW (ref 3.87–5.11)
RDW: 19.3 % — ABNORMAL HIGH (ref 11.5–15.5)
WBC Count: 3.6 K/uL — ABNORMAL LOW (ref 4.0–10.5)
nRBC: 0 % (ref 0.0–0.2)

## 2023-10-23 MED ORDER — EPOETIN ALFA-EPBX 40000 UNIT/ML IJ SOLN
40000.0000 [IU] | Freq: Once | INTRAMUSCULAR | Status: AC
  Administered 2023-10-23: 40000 [IU] via SUBCUTANEOUS
  Filled 2023-10-23: qty 1

## 2023-10-23 NOTE — Patient Instructions (Signed)

## 2023-10-23 NOTE — Patient Instructions (Signed)

## 2023-10-24 ENCOUNTER — Ambulatory Visit (INDEPENDENT_AMBULATORY_CARE_PROVIDER_SITE_OTHER): Payer: Self-pay | Admitting: Family Medicine

## 2023-10-24 ENCOUNTER — Encounter: Payer: Self-pay | Admitting: Family Medicine

## 2023-10-24 VITALS — BP 120/60 | HR 83 | Temp 98.9°F | Ht 64.0 in | Wt 177.0 lb

## 2023-10-24 DIAGNOSIS — R0981 Nasal congestion: Secondary | ICD-10-CM

## 2023-10-24 DIAGNOSIS — R051 Acute cough: Secondary | ICD-10-CM

## 2023-10-24 DIAGNOSIS — J302 Other seasonal allergic rhinitis: Secondary | ICD-10-CM

## 2023-10-24 DIAGNOSIS — J309 Allergic rhinitis, unspecified: Secondary | ICD-10-CM

## 2023-10-24 LAB — POC SOFIA 2 FLU + SARS ANTIGEN FIA
Influenza A, POC: NEGATIVE
Influenza B, POC: NEGATIVE
SARS Coronavirus 2 Ag: NEGATIVE

## 2023-10-24 MED ORDER — TRIAMCINOLONE ACETONIDE 55 MCG/ACT NA AERO: 1.0000 | INHALATION_SPRAY | Freq: Two times a day (BID) | NASAL | 11 refills | Status: DC

## 2023-10-24 MED ORDER — MONTELUKAST SODIUM 10 MG PO TABS: 10.0000 mg | ORAL_TABLET | Freq: Every day | ORAL | 2 refills | Status: DC

## 2023-10-24 NOTE — Progress Notes (Signed)
 I,[name], CMA,acting as a Neurosurgeon for [employer], NP.,have documented all relevant documentation on the behalf of Bruna Creighton, NP,as directed by  Bruna Creighton, NP while in the presence of Bruna Creighton, NP.  Subjective:  Patient ID: Emily Greer , female    DOB: 07-31-1950 , 73 y.o.   MRN: [id number]  Chief Complaint  Patient presents with   URI    Patient reports she has been having a runny/stuffy nose and coughing. Patient stated her symptoms started 2 months ago. She reports she has been trying otc meds and nothing is working.     HPI Discussed the use of AI scribe software for clinical note transcription with the patient, who gave verbal consent to proceed.  History of Present Illness    Emily Greer is a 73 year old female who presents with a runny nose and nasal congestion.  She has been experiencing a runny nose primarily on the right side for the past one to two months. The mucus is yellowish and continues to drain even after blowing her nose. She also experiences nasal congestion, particularly at night, which requires her to sit up to sleep. A mild cough occurs at night but does not persist.  She has a history of allergies and has been taking Zyrtec  and Claritin , but Zyrtec  has not been effective. She has not seen an allergy specialist in years and recalls being previously tested and found allergic to 'everything'. Allergy shots in the past helped clear her symptoms.  She is currently taking Montelukast  (Singulair ) and uses a nasal spray, though she has been out of Montelukast , will refill. She states that she is finding the Vicks spray more effective than others. She has not been using any specific nasal sprays recently but is open to getting any.  No fever or body aches. She monitors her temperature at home and has not recorded any fevers recently.     Past Medical History:  Diagnosis Date   Avascular necrosis of femoral head (HCC)    bilateral per CT  07-26-2015   Endometrial carcinoma [facility]) gyn oncologist-  dr jerrol ([facility])/  radiation oncologist-- dr shannon   2013 dx  FIGO Stage 1A, Grade 2 endometrioid endometrial cancer s/p TAH w/ BSO and bilateral pelvic node dissection 10-31-2011 ;  recurrence at distal vagina 04/ 2014 s/p  brachytherapy (ended 07-29-2012);  2nd recurrence 12/ 2016  vaginal apex s/p  conformational radiotherapy 03-10-2015 to 04-20-2015   Family history of adverse reaction to anesthesia    mother ponv   GERD (gastroesophageal reflux disease)    Goals of care, counseling/discussion 08/21/2019   H/O stem cell transplant (HCC)    02/ 2000 and second one 06/ 2016   History of bacteremia    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   History of chemotherapy    last chemo 12-26-17   History of radiation therapy 6/4, 6/11, 6/19, 6/25, 07/29/2012   vagina 30.5 gray in 5 fx, HDR brachytherapy:   last radiation to vagina 03-10-2015 to 04-20-2015  50.4gray   History of radiation therapy 03/10/15-04/20/15   vagina 50.4 in 28 fractions   Hypertension    Lambda light chain myeloma [facility]) oncologist-  dr timmy ([facility])  and  [facility] -- dr lela gasparetto   dx 07/ 1999 s/p  VAD chemotherapy 11/ 1999,  purged autotransplant 03-21-1998 followed by auto stem cell transplant 03-29-1998;  recurrance w/ second autologous stem cell transplant 07-24-2014;  in Re-mission currently , chemo maintenance therapy   Osteoporosis 12/18/05   Increased  risk    PONV (postoperative nausea and vomiting)    Wears glasses      Family History  Problem Relation Age of Onset   Colon cancer Mother    Hypertension Father    Heart Problems Father      Current Outpatient Medications:    acyclovir  (ZOVIRAX ) 400 MG tablet, TAKE 1 TABLET(400 MG) BY MOUTH TWICE DAILY, Disp: 60 tablet, Rfl: 6   albuterol  (VENTOLIN  HFA) 108 (90 Base) MCG/ACT inhaler, Inhale 2 puffs into the lungs every 6 (six) hours as needed for  wheezing or shortness of breath. 2 puffs 3 times daily x 5 days then every 6 hours as needed., Disp: 18 g, Rfl: 11   aspirin EC 81 MG tablet, Take 81 mg by mouth daily., Disp: , Rfl:    benzonatate  (TESSALON  PERLES) 100 MG capsule, Take 1 capsule (100 mg total) by mouth 3 (three) times daily as needed for cough., Disp: 30 capsule, Rfl: 0   calcium-vitamin D  (OSCAL WITH D) 500-5 MG-MCG tablet, Take 1 tablet by mouth daily., Disp: , Rfl:    chlorhexidine (PERIDEX) 0.12 % solution, 10 mLs 2 (two) times daily., Disp: , Rfl:    Cholecalciferol  (VITAMIN D3) 2000 UNITS TABS, Take 2 tablets by mouth daily., Disp: , Rfl:    letrozole  (FEMARA ) 2.5 MG tablet, Take 1 tablet (2.5 mg total) by mouth daily., Disp: 30 tablet, Rfl: 12   levocetirizine (XYZAL  ALLERGY 24HR) 5 MG tablet, Take 1 tablet (5 mg total) by mouth every evening., Disp: 30 tablet, Rfl: 3   metoprolol  tartrate (LOPRESSOR ) 50 MG tablet, Take 1/2 tablet by mouth twice daily, Disp: 90 tablet, Rfl: 1   montelukast  (SINGULAIR ) 10 MG tablet, Take 1 tablet (10 mg total) by mouth daily., Disp: 30 tablet, Rfl: 2   potassium chloride  SA (KLOR-CON  M) 20 MEQ tablet, TAKE 1 TABLET(20 MEQ) BY MOUTH TWICE DAILY, Disp: 60 tablet, Rfl: 3   Safety Seal Miscellaneous MISC, Apply 1 Application topically in the morning. Medication Name: Hormonic Hair Solution (Mioxidil 7%, Finasteride 0.05%), Disp: 30 mL, Rfl: 6   amLODipine  (NORVASC ) 10 MG tablet, TAKE 1 TABLET(10 MG) BY MOUTH EVERY MORNING, Disp: 90 tablet, Rfl: 2   triamcinolone  (NASACORT ) 55 MCG/ACT AERO nasal inhaler, Place 1 spray into the nose 2 (two) times daily., Disp: 60 each, Rfl: 11 No current facility-administered medications for this visit.  Facility-Administered Medications Ordered in Other Visits:    0.9 %  sodium chloride  infusion, , Intravenous, Once PRN, Ennever, Peter R, MD   albuterol  (PROVENTIL ) (2.5 MG/3ML) 0.083% nebulizer solution 2.5 mg, 2.5 mg, Nebulization, Once PRN, Ennever, Peter R,  MD   sodium chloride  flush (NS) 0.9 % injection 10 mL, 10 mL, Intravenous, PRN, Franchot Lauraine HERO, NP, 10 mL at 12/26/17 1130   sodium chloride  flush (NS) 0.9 % injection 10 mL, 10 mL, Intravenous, PRN, Ennever, Peter R, MD, 10 mL at 04/24/19 1305   sodium chloride  flush (NS) 0.9 % injection 10 mL, 10 mL, Intravenous, PRN, Ennever, Peter R, MD, 10 mL at 08/21/19 1233   sodium chloride  flush (NS) 0.9 % injection 10 mL, 10 mL, Intracatheter, PRN, Franchot Lauraine HERO, NP, 10 mL at 10/01/19 1053   sodium chloride  flush (NS) 0.9 % injection 10 mL, 10 mL, Intracatheter, PRN, Franchot Lauraine HERO, NP, 10 mL at 12/11/19 1414   sodium chloride  flush (NS) 0.9 % injection 10 mL, 10  mL, Intravenous, PRN, Franchot Lauraine HERO, NP, 10 mL at 07/06/21 1105   sodium chloride  flush (NS) 0.9 % injection 10 mL, 10 mL, Intracatheter, PRN, Franchot Lauraine HERO, NP, 10 mL at 07/14/21 0931   Allergies  Allergen Reactions   Codeine Nausea Only     Review of Systems  Constitutional: Negative.  Negative for fever.  HENT:  Positive for congestion and postnasal drip.   Respiratory:  Positive for cough.   Cardiovascular: Negative.   Musculoskeletal: Negative.   Skin: Negative.   Psychiatric/Behavioral: Negative.       Today's Vitals   10/24/23 1214  BP: 120/60  Pulse: 83  Temp: 98.9 F (37.2 C)  TempSrc: Oral  Weight: 177 lb (80.3 kg)  Height: 5' 4 (1.626 m)  PainSc: 0-No pain   Body mass index is 30.38 kg/m.  Wt Readings from Last 3 Encounters:  10/30/23 177 lb (80.3 kg)  10/24/23 177 lb (80.3 kg)  09/11/23 171 lb (77.6 kg)    The 10-year ASCVD risk score (Arnett DK, et al., 2019) is: 9.5%   Values used to calculate the score:     Age: 73 years     Clincally relevant sex: Female     Is Non-Hispanic African American: Yes     Diabetic: No     Tobacco smoker: No     Systolic Blood Pressure: 130 mmHg     Is BP treated: Yes     HDL Cholesterol: 76 mg/dL     Total Cholesterol: 134 mg/dL  Objective:  Physical  Exam Constitutional:      Appearance: Normal appearance.  Cardiovascular:     Rate and Rhythm: Normal rate and regular rhythm.     Pulses: Normal pulses.     Heart sounds: Normal heart sounds.  Pulmonary:     Effort: Pulmonary effort is normal.     Breath sounds: Normal breath sounds.  Abdominal:     General: Bowel sounds are normal.  Neurological:     Mental Status: She is alert and oriented to person, place, and time. Mental status is at baseline.         Assessment And Plan:  Nasal congestion -     POC SOFIA 2 FLU + SARS ANTIGEN FIA  Seasonal allergic rhinitis, unspecified trigger -     Montelukast  Sodium; Take 1 tablet (10 mg total) by mouth daily.  Dispense: 30 tablet; Refill: 2 -     Triamcinolone  Acetonide; Place 1 spray into the nose 2 (two) times daily.  Dispense: 60 each; Refill: 11 -     Ambulatory referral to Allergy    Return if symptoms worsen or fail to improve, for keep previous appt.  Patient was given opportunity to ask questions. Patient verbalized understanding of the plan and was able to repeat key elements of the plan. All questions were answered to their satisfaction.    I, Bruna Creighton, NP, have reviewed all documentation for this visit. The documentation on 11/06/2023 for the exam, diagnosis, procedures, and orders are all accurate and complete.     IF YOU HAVE BEEN REFERRED TO A SPECIALIST, IT MAY TAKE 1-2 WEEKS TO SCHEDULE/PROCESS THE REFERRAL. IF YOU HAVE NOT HEARD FROM US /SPECIALIST IN TWO WEEKS, PLEASE GIVE US  A CALL AT [phone] X [phone].

## 2023-10-30 ENCOUNTER — Encounter: Payer: Self-pay | Admitting: Internal Medicine

## 2023-10-30 ENCOUNTER — Ambulatory Visit (INDEPENDENT_AMBULATORY_CARE_PROVIDER_SITE_OTHER): Admitting: Internal Medicine

## 2023-10-30 VITALS — BP 130/82 | HR 80 | Temp 98.4°F | Ht 64.0 in | Wt 177.0 lb

## 2023-10-30 DIAGNOSIS — Z8249 Family history of ischemic heart disease and other diseases of the circulatory system: Secondary | ICD-10-CM | POA: Diagnosis not present

## 2023-10-30 DIAGNOSIS — E6609 Other obesity due to excess calories: Secondary | ICD-10-CM

## 2023-10-30 DIAGNOSIS — E66811 Obesity, class 1: Secondary | ICD-10-CM | POA: Diagnosis not present

## 2023-10-30 DIAGNOSIS — Z6830 Body mass index (BMI) 30.0-30.9, adult: Secondary | ICD-10-CM | POA: Diagnosis not present

## 2023-10-30 DIAGNOSIS — I119 Hypertensive heart disease without heart failure: Secondary | ICD-10-CM

## 2023-10-30 DIAGNOSIS — J309 Allergic rhinitis, unspecified: Secondary | ICD-10-CM | POA: Diagnosis not present

## 2023-10-30 DIAGNOSIS — D61818 Other pancytopenia: Secondary | ICD-10-CM

## 2023-10-30 DIAGNOSIS — I2584 Coronary atherosclerosis due to calcified coronary lesion: Secondary | ICD-10-CM | POA: Diagnosis not present

## 2023-10-30 DIAGNOSIS — C9001 Multiple myeloma in remission: Secondary | ICD-10-CM

## 2023-10-30 DIAGNOSIS — R0982 Postnasal drip: Secondary | ICD-10-CM | POA: Diagnosis not present

## 2023-10-30 DIAGNOSIS — I2510 Atherosclerotic heart disease of native coronary artery without angina pectoris: Secondary | ICD-10-CM | POA: Diagnosis not present

## 2023-10-30 MED ORDER — AMLODIPINE BESYLATE 10 MG PO TABS: ORAL_TABLET | ORAL | 2 refills | Status: AC

## 2023-10-30 NOTE — Progress Notes (Signed)
 I,Emily Greer, CMA,acting as a Neurosurgeon for Emily LOISE Slocumb, MD.,have documented all relevant documentation on the behalf of Emily LOISE Slocumb, MD,as directed by  Emily LOISE Slocumb, MD while in the presence of Emily LOISE Slocumb, MD.  Subjective:  Patient ID: Emily Greer , female    DOB: 1950/07/05 , 73 y.o.   MRN: [id number]  Chief Complaint  Patient presents with   Hypertension    She presents today for BP f/u. She reports compliance with meds. Denies having any headache, chest pain, and SOB. She reports compliance with meds.     HPI Discussed the use of AI scribe software for clinical note transcription with the patient, who gave verbal consent to proceed.  History of Present Illness Emily Greer is a 73 year old female who presents for a blood pressure check and evaluation of chronic nasal drainage.  She has been experiencing unilateral nasal drainage for about two months, accompanied by congestion, particularly at night, necessitating an upright position to breathe. Her husband observed mouth breathing. She recently initiated a nasal spray and Singulair , both started yesterday. She also uses over-the-counter Xyzal  for allergies.  She was recently seen by NP for similar symptoms.   She has a history of pancytopenia and received CAR T-cell therapy almost two years ago. She is monitored for low white and red blood cell counts. No kidney issues are reported, despite a previous incorrect diagnosis of end-stage renal disease.  Her current medications include acyclovir  twice daily, an albuterol  inhaler as needed, amlodipine  10 mg daily, aspirin, Xyzal , Singulair , metoprolol  half a tablet twice daily, potassium, and a nasal spray. She recently received a new albuterol  inhaler and nasal spray from her nurse.  A PET scan in June for cancer monitoring revealed heart calcifications. No chest pain or shortness of breath is reported, but there is decreased physical activity, particularly  walking. There is a family history of heart disease, with her father having an enlarged heart and her mother also having heart issues.  She notes a weight gain of six pounds since August, attributing it to different scales.   Hypertension This is a chronic problem. The current episode started more than 1 year ago. The problem has been gradually improving since onset. The problem is controlled. Pertinent negatives include no blurred vision. Past treatments include beta blockers and diuretics. The current treatment provides moderate improvement. Compliance problems include exercise.  There is no history of pheochromocytoma or renovascular disease.     Past Medical History:  Diagnosis Date   Avascular necrosis of femoral head (HCC)    bilateral per CT 07-26-2015   Endometrial carcinoma [facility]) gyn oncologist-  dr jerrol ([facility])/  radiation oncologist-- dr shannon   2013 dx  FIGO Stage 1A, Grade 2 endometrioid endometrial cancer s/p TAH w/ BSO and bilateral pelvic node dissection 10-31-2011 ;  recurrence at distal vagina 04/ 2014 s/p  brachytherapy (ended 07-29-2012);  2nd recurrence 12/ 2016  vaginal apex s/p  conformational radiotherapy 03-10-2015 to 04-20-2015   Family history of adverse reaction to anesthesia    mother ponv   GERD (gastroesophageal reflux disease)    Goals of care, counseling/discussion 08/21/2019   H/O stem cell transplant [facility])    02/ 2000 and second one 06/ 2016   History of bacteremia    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   History of chemotherapy    last chemo 12-26-17   History of radiation therapy 6/4, 6/11, 6/19, 6/25,  07/29/2012   vagina 30.5 gray in 5 fx, HDR brachytherapy:   last radiation to vagina 03-10-2015 to 04-20-2015  50.4gray   History of radiation therapy 03/10/15-04/20/15   vagina 50.4 in 28 fractions   Hypertension    Lambda light chain myeloma [facility]) oncologist-  dr timmy ([facility])  and  [facility] -- dr lela  gasparetto   dx 07/ 1999 s/p  VAD chemotherapy 11/ 1999,  purged autotransplant 03-21-1998 followed by auto stem cell transplant 03-29-1998;  recurrance w/ second autologous stem cell transplant 07-24-2014;  in Re-mission currently , chemo maintenance therapy   Osteoporosis 12/18/05   Increased  risk    PONV (postoperative nausea and vomiting)    Wears glasses      Family History  Problem Relation Age of Onset   Colon cancer Mother    Hypertension Father    Heart Problems Father      Current Outpatient Medications:    acyclovir  (ZOVIRAX ) 400 MG tablet, TAKE 1 TABLET(400 MG) BY MOUTH TWICE DAILY, Disp: 60 tablet, Rfl: 6   albuterol  (VENTOLIN  HFA) 108 (90 Base) MCG/ACT inhaler, Inhale 2 puffs into the lungs every 6 (six) hours as needed for wheezing or shortness of breath. 2 puffs 3 times daily x 5 days then every 6 hours as needed., Disp: 18 g, Rfl: 11   aspirin EC 81 MG tablet, Take 81 mg by mouth daily., Disp: , Rfl:    benzonatate  (TESSALON  PERLES) 100 MG capsule, Take 1 capsule (100 mg total) by mouth 3 (three) times daily as needed for cough., Disp: 30 capsule, Rfl: 0   calcium-vitamin D  (OSCAL WITH D) 500-5 MG-MCG tablet, Take 1 tablet by mouth daily., Disp: , Rfl:    chlorhexidine (PERIDEX) 0.12 % solution, 10 mLs 2 (two) times daily., Disp: , Rfl:    Cholecalciferol  (VITAMIN D3) 2000 UNITS TABS, Take 2 tablets by mouth daily., Disp: , Rfl:    letrozole  (FEMARA ) 2.5 MG tablet, Take 1 tablet (2.5 mg total) by mouth daily., Disp: 30 tablet, Rfl: 12   levocetirizine (XYZAL  ALLERGY 24HR) 5 MG tablet, Take 1 tablet (5 mg total) by mouth every evening., Disp: 30 tablet, Rfl: 3   metoprolol  tartrate (LOPRESSOR ) 50 MG tablet, Take 1/2 tablet by mouth twice daily, Disp: 90 tablet, Rfl: 1   montelukast  (SINGULAIR ) 10 MG tablet, Take 1 tablet (10 mg total) by mouth daily., Disp: 30 tablet, Rfl: 2   potassium chloride  SA (KLOR-CON  M) 20 MEQ tablet, TAKE 1 TABLET(20 MEQ) BY MOUTH TWICE DAILY,  Disp: 60 tablet, Rfl: 3   Safety Seal Miscellaneous MISC, Apply 1 Application topically in the morning. Medication Name: Hormonic Hair Solution (Mioxidil 7%, Finasteride 0.05%), Disp: 30 mL, Rfl: 6   triamcinolone  (NASACORT ) 55 MCG/ACT AERO nasal inhaler, Place 1 spray into the nose 2 (two) times daily., Disp: 60 each, Rfl: 11   amLODipine  (NORVASC ) 10 MG tablet, TAKE 1 TABLET(10 MG) BY MOUTH EVERY MORNING, Disp: 90 tablet, Rfl: 2 No current facility-administered medications for this visit.  Facility-Administered Medications Ordered in Other Visits:    0.9 %  sodium chloride  infusion, , Intravenous, Once PRN, Ennever, Peter R, MD   albuterol  (PROVENTIL ) (2.5 MG/3ML) 0.083% nebulizer solution 2.5 mg, 2.5 mg, Nebulization, Once PRN, Ennever, Peter R, MD   sodium chloride  flush (NS) 0.9 % injection 10 mL, 10 mL, Intravenous, PRN, Franchot Lauraine HERO, NP, 10 mL at 12/26/17 1130   sodium chloride  flush (NS) 0.9 % injection 10 mL, 10 mL, Intravenous,  PRN, Ennever, Peter R, MD, 10 mL at 04/24/19 1305   sodium chloride  flush (NS) 0.9 % injection 10 mL, 10 mL, Intravenous, PRN, Ennever, Peter R, MD, 10 mL at 08/21/19 1233   sodium chloride  flush (NS) 0.9 % injection 10 mL, 10 mL, Intracatheter, PRN, Franchot Lauraine HERO, NP, 10 mL at 10/01/19 1053   sodium chloride  flush (NS) 0.9 % injection 10 mL, 10 mL, Intracatheter, PRN, Franchot Lauraine HERO, NP, 10 mL at 12/11/19 1414   sodium chloride  flush (NS) 0.9 % injection 10 mL, 10 mL, Intravenous, PRN, Franchot Lauraine HERO, NP, 10 mL at 07/06/21 1105   sodium chloride  flush (NS) 0.9 % injection 10 mL, 10 mL, Intracatheter, PRN, Franchot Lauraine HERO, NP, 10 mL at 07/14/21 0931   Allergies  Allergen Reactions   Codeine Nausea Only     Review of Systems  Constitutional: Negative.   HENT:  Positive for postnasal drip.   Eyes:  Negative for blurred vision.  Respiratory: Negative.    Cardiovascular: Negative.   Gastrointestinal: Negative.   Neurological: Negative.    Psychiatric/Behavioral: Negative.       Today's Vitals   10/30/23 1444  BP: 130/82  Pulse: 80  Temp: 98.4 F (36.9 C)  SpO2: 98%  Weight: 177 lb (80.3 kg)  Height: 5' 4 (1.626 m)   Body mass index is 30.38 kg/m.  Wt Readings from Last 3 Encounters:  10/30/23 177 lb (80.3 kg)  10/24/23 177 lb (80.3 kg)  09/11/23 171 lb (77.6 kg)    BP Readings from Last 3 Encounters:  10/30/23 130/82  10/24/23 120/60  10/23/23 131/66     Objective:  Physical Exam Vitals and nursing note reviewed.  Constitutional:      Appearance: Normal appearance.  HENT:     Head: Normocephalic and atraumatic.  Eyes:     Extraocular Movements: Extraocular movements intact.  Cardiovascular:     Rate and Rhythm: Normal rate and regular rhythm.     Heart sounds: Murmur heard.  Pulmonary:     Effort: Pulmonary effort is normal.     Breath sounds: Normal breath sounds.  Musculoskeletal:     Cervical back: Normal range of motion.  Skin:    General: Skin is warm.  Neurological:     General: No focal deficit present.     Mental Status: She is alert.  Psychiatric:        Mood and Affect: Mood normal.        Behavior: Behavior normal.       Assessment And Plan:  Hypertension with heart disease Assessment & Plan: Chronic, fair control. Goal BP <130/80.  She will continue with metoprolol  50mg  1/2 tab twice daily and amlodipine  10mg  daily.  - Follow low sodium diet.  - Incorporate more exercise into her daily routine.   Orders: -     amLODIPine  Besylate; TAKE 1 TABLET(10 MG) BY MOUTH EVERY MORNING  Dispense: 90 tablet; Refill: 2 -     Lipid panel -     Microalbumin / creatinine urine ratio  Coronary artery calcification of native artery Assessment & Plan: Incidental finding on PET Scan. LDL goal is less than 70.   - Check echocardiogram - She is currently asymptomatic. - Schedule echocardiogram at [facility]. - Consider referral to Dr. Annabella Scarce, cardiologist, if  echocardiogram indicates need.  Orders: -     ECHOCARDIOGRAM COMPLETE; Future -     Lipid panel -     Lipoprotein A (LPA)  Allergic rhinitis with postnasal drip Assessment & Plan: Chronic drainage and congestion for two months with unilateral mucus and nighttime symptoms. No allergy evaluation conducted. - Refer to allergist, preferably Dr. Jeneal, for formal allergy evaluation. - Instruct to stop allergy medication one week before allergy appointment.  Orders: -     Ambulatory referral to Allergy  Multiple myeloma in remission [facility]) Assessment & Plan: Chronic, most recent Oncology notes reviewed.  Treatment plan includes Retacrit  and Ferumoxytol  q7 days along with IVIG.  Name Type Plan Dates Plan Provider   RETACRIT CORINE FLUSH Q6WK  & FERUMOXYTOL  (FERAHEME ) Q7D & IVIG - Low Dose         Other pancytopenia (HCC) Assessment & Plan: Most recent labs reviewed --- WBC and HB are low, but stable.    Class 1 obesity due to excess calories with serious comorbidity and body mass index (BMI) of 30.0 to 30.9 in adult Assessment & Plan: Her BMI is acceptable for her demographic. Encouraged to aim for at least 150 minutes of exercise per week.         Family history of heart disease -     Lipoprotein A (LPA)   Return for 4 MONTH BPC.  Patient was given opportunity to ask questions. Patient verbalized understanding of the plan and was able to repeat key elements of the plan. All questions were answered to their satisfaction.   I, Emily LOISE Slocumb, MD, have reviewed all documentation for this visit. The documentation on 10/30/23 for the exam, diagnosis, procedures, and orders are all accurate and complete.  IF YOU HAVE BEEN REFERRED TO A SPECIALIST, IT MAY TAKE 1-2 WEEKS TO SCHEDULE/PROCESS THE REFERRAL. IF YOU HAVE NOT HEARD FROM US /SPECIALIST IN TWO WEEKS, PLEASE GIVE US  A CALL AT [phone].   THE PATIENT IS ENCOURAGED TO PRACTICE SOCIAL DISTANCING DUE TO THE COVID-19  PANDEMIC.

## 2023-10-30 NOTE — Patient Instructions (Signed)
 Hypertension, Adult Hypertension is another name for high blood pressure. High blood pressure forces your heart to work harder to pump blood. This can cause problems over time. There are two numbers in a blood pressure reading. There is a top number (systolic) over a bottom number (diastolic). It is best to have a blood pressure that is below 120/80. What are the causes? The cause of this condition is not known. Some other conditions can lead to high blood pressure. What increases the risk? Some lifestyle factors can make you more likely to develop high blood pressure: Smoking. Not getting enough exercise or physical activity. Being overweight. Having too much fat, sugar, calories, or salt (sodium) in your diet. Drinking too much alcohol. Other risk factors include: Having any of these conditions: Heart disease. Diabetes. High cholesterol. Kidney disease. Obstructive sleep apnea. Having a family history of high blood pressure and high cholesterol. Age. The risk increases with age. Stress. What are the signs or symptoms? High blood pressure may not cause symptoms. Very high blood pressure (hypertensive crisis) may cause: Headache. Fast or uneven heartbeats (palpitations). Shortness of breath. Nosebleed. Vomiting or feeling like you may vomit (nauseous). Changes in how you see. Very bad chest pain. Feeling dizzy. Seizures. How is this treated? This condition is treated by making healthy lifestyle changes, such as: Eating healthy foods. Exercising more. Drinking less alcohol. Your doctor may prescribe medicine if lifestyle changes do not help enough and if: Your top number is above 130. Your bottom number is above 80. Your personal target blood pressure may vary. Follow these instructions at home: Eating and drinking  If told, follow the DASH eating plan. To follow this plan: Fill one half of your plate at each meal with fruits and vegetables. Fill one fourth of your plate  at each meal with whole grains. Whole grains include whole-wheat pasta, brown rice, and whole-grain bread. Eat or drink low-fat dairy products, such as skim milk or low-fat yogurt. Fill one fourth of your plate at each meal with low-fat (lean) proteins. Low-fat proteins include fish, chicken without skin, eggs, beans, and tofu. Avoid fatty meat, cured and processed meat, or chicken with skin. Avoid pre-made or processed food. Limit the amount of salt in your diet to less than 1,500 mg each day. Do not drink alcohol if: Your doctor tells you not to drink. You are pregnant, may be pregnant, or are planning to become pregnant. If you drink alcohol: Limit how much you have to: 0-1 drink a day for women. 0-2 drinks a day for men. Know how much alcohol is in your drink. In the [place] one drink equals one 12 oz bottle of beer (355 mL), one 5 oz glass of wine (148 mL), or one 1 oz glass of hard liquor (44 mL). Lifestyle  Work with your doctor to stay at a healthy weight or to lose weight. Ask your doctor what the best weight is for you. Get at least 30 minutes of exercise that causes your heart to beat faster (aerobic exercise) most days of the week. This may include walking, swimming, or biking. Get at least 30 minutes of exercise that strengthens your muscles (resistance exercise) at least 3 days a week. This may include lifting weights or doing Pilates. Do not smoke or use any products that contain nicotine or tobacco. If you need help quitting, ask your doctor. Check your blood pressure at home as told by your doctor. Keep all follow-up visits. Medicines Take over-the-counter and prescription medicines  only as told by your doctor. Follow directions carefully. Do not skip doses of blood pressure medicine. The medicine does not work as well if you skip doses. Skipping doses also puts you at risk for problems. Ask your doctor about side effects or reactions to medicines that you should watch  for. Contact a doctor if: You think you are having a reaction to the medicine you are taking. You have headaches that keep coming back. You feel dizzy. You have swelling in your ankles. You have trouble with your vision. Get help right away if: You get a very bad headache. You start to feel mixed up (confused). You feel weak or numb. You feel faint. You have very bad pain in your: Chest. Belly (abdomen). You vomit more than once. You have trouble breathing. These symptoms may be an emergency. Get help right away. Call 911. Do not wait to see if the symptoms will go away. Do not drive yourself to the hospital. Summary Hypertension is another name for high blood pressure. High blood pressure forces your heart to work harder to pump blood. For most people, a normal blood pressure is less than 120/80. Making healthy choices can help lower blood pressure. If your blood pressure does not get lower with healthy choices, you may need to take medicine. This information is not intended to replace advice given to you by your health care provider. Make sure you discuss any questions you have with your health care provider. Document Revised: 11/04/2020 Document Reviewed: 11/04/2020 Elsevier Patient Education  2024 [employer].

## 2023-10-31 LAB — LIPID PANEL
Chol/HDL Ratio: 1.8 ratio (ref 0.0–4.4)
Cholesterol, Total: 134 mg/dL (ref 100–199)
HDL: 76 mg/dL (ref >39)
LDL Chol Calc (NIH): 38 mg/dL (ref 0–99)
Triglycerides: 116 mg/dL (ref 0–149)
VLDL Cholesterol Cal: 20 mg/dL (ref 5–40)

## 2023-10-31 LAB — MICROALBUMIN / CREATININE URINE RATIO
Creatinine, Urine: 135.4 mg/dL
Microalb/Creat Ratio: 18 mg/g{creat} (ref 0–29)
Microalbumin, Urine: 25 ug/mL

## 2023-10-31 LAB — LIPOPROTEIN A (LPA): Lipoprotein (a): 39.3 nmol/L (ref <75.0)

## 2023-11-04 DIAGNOSIS — I2510 Atherosclerotic heart disease of native coronary artery without angina pectoris: Secondary | ICD-10-CM | POA: Insufficient documentation

## 2023-11-04 NOTE — Assessment & Plan Note (Addendum)
 Most recent labs reviewed --- WBC and HB are low, but stable.

## 2023-11-04 NOTE — Assessment & Plan Note (Signed)
Her BMI is acceptable for her demographic. Encouraged to aim for at least 150 minutes of exercise per week

## 2023-11-04 NOTE — Assessment & Plan Note (Signed)
 Chronic, fair control. Goal BP <130/80.  She will continue with metoprolol  50mg  1/2 tab twice daily and amlodipine  10mg  daily.  - Follow low sodium diet.  - Incorporate more exercise into her daily routine.

## 2023-11-04 NOTE — Assessment & Plan Note (Addendum)
 Incidental finding on PET Scan. LDL goal is less than 70.   - Check echocardiogram - She is currently asymptomatic. - Schedule echocardiogram at [facility]. - Consider referral to Dr. Annabella Scarce, cardiologist, if echocardiogram indicates need.

## 2023-11-04 NOTE — Assessment & Plan Note (Signed)
 Chronic, most recent Oncology notes reviewed.  Treatment plan includes Retacrit  and Ferumoxytol  q7 days along with IVIG.  Name Type Plan Dates Plan Provider   RETACRIT CORINE FLUSH Q6WK  & FERUMOXYTOL  (FERAHEME ) Q7D & IVIG - Low Dose

## 2023-11-04 NOTE — Assessment & Plan Note (Signed)
 Chronic drainage and congestion for two months with unilateral mucus and nighttime symptoms. No allergy evaluation conducted. - Refer to allergist, preferably Dr. Jeneal, for formal allergy evaluation. - Instruct to stop allergy medication one week before allergy appointment.

## 2023-11-06 ENCOUNTER — Ambulatory Visit: Payer: Self-pay | Admitting: Internal Medicine

## 2023-11-07 ENCOUNTER — Ambulatory Visit: Payer: Self-pay

## 2023-11-07 ENCOUNTER — Ambulatory Visit: Payer: Medicare Other

## 2023-11-07 DIAGNOSIS — R0981 Nasal congestion: Secondary | ICD-10-CM | POA: Insufficient documentation

## 2023-11-07 DIAGNOSIS — Z1231 Encounter for screening mammogram for malignant neoplasm of breast: Secondary | ICD-10-CM

## 2023-11-07 DIAGNOSIS — Z0000 Encounter for general adult medical examination without abnormal findings: Secondary | ICD-10-CM | POA: Diagnosis not present

## 2023-11-07 DIAGNOSIS — J302 Other seasonal allergic rhinitis: Secondary | ICD-10-CM | POA: Insufficient documentation

## 2023-11-07 NOTE — Progress Notes (Signed)
 Subjective:   Emily Greer is a 73 y.o. who presents for a Medicare Wellness preventive visit.  As a reminder, Annual Wellness Visits don't include a physical exam, and some assessments may be limited, especially if this visit is performed virtually. We may recommend an in-person follow-up visit with your provider if needed.  Visit Complete: Virtual I connected with  Emily Greer on 11/07/23 by a audio enabled telemedicine application and verified that I am speaking with the correct person using two identifiers.  Patient Location: Home  Provider Location: Office/Clinic  I discussed the limitations of evaluation and management by telemedicine. The patient expressed understanding and agreed to proceed.  Vital Signs: Because this visit was a virtual/telehealth visit, some criteria may be missing or patient reported. Any vitals not documented were not able to be obtained and vitals that have been documented are patient reported.  VideoError- Librarian, academic were attempted between this provider and patient, however failed, due to patient having technical difficulties OR patient did not have access to video capability.  We continued and completed visit with audio only.   Persons Participating in Visit: Patient.  AWV Questionnaire: No: Patient Medicare AWV questionnaire was not completed prior to this visit.  Cardiac Risk Factors include: advanced age (>2men, >43 women);hypertension     Objective:    Today's Vitals   There is no height or weight on file to calculate BMI.     11/07/2023    9:52 AM 09/11/2023    9:17 AM 07/12/2023    9:14 AM 05/24/2023    8:49 AM 03/26/2023   10:59 AM 11/01/2022    2:14 PM 10/11/2022    9:49 AM  Advanced Directives  Does Patient Have a Medical Advance Directive? No No No No No No No  Type of Advance Directive  Living will       Does patient want to make changes to medical advance directive?   No - Patient  declined No - Patient declined No - Patient declined    Copy of Healthcare Power of Attorney in Chart?  No - copy requested       Would patient like information on creating a medical advance directive? No - Patient declined No - Patient declined No - Patient declined  No - Patient declined No - Patient declined No - Patient declined    Current Medications (verified) Outpatient Encounter Medications as of 11/07/2023  Medication Sig   acyclovir  (ZOVIRAX ) 400 MG tablet TAKE 1 TABLET(400 MG) BY MOUTH TWICE DAILY   albuterol  (VENTOLIN  HFA) 108 (90 Base) MCG/ACT inhaler Inhale 2 puffs into the lungs every 6 (six) hours as needed for wheezing or shortness of breath. 2 puffs 3 times daily x 5 days then every 6 hours as needed.   amLODipine  (NORVASC ) 10 MG tablet TAKE 1 TABLET(10 MG) BY MOUTH EVERY MORNING   aspirin EC 81 MG tablet Take 81 mg by mouth daily.   benzonatate  (TESSALON  PERLES) 100 MG capsule Take 1 capsule (100 mg total) by mouth 3 (three) times daily as needed for cough.   calcium-vitamin D  (OSCAL WITH D) 500-5 MG-MCG tablet Take 1 tablet by mouth daily.   chlorhexidine (PERIDEX) 0.12 % solution 10 mLs 2 (two) times daily.   Cholecalciferol  (VITAMIN D3) 2000 UNITS TABS Take 2 tablets by mouth daily.   letrozole  (FEMARA ) 2.5 MG tablet Take 1 tablet (2.5 mg total) by mouth daily.   levocetirizine (XYZAL  ALLERGY 24HR) 5 MG tablet Take 1  tablet (5 mg total) by mouth every evening.   metoprolol  tartrate (LOPRESSOR ) 50 MG tablet Take 1/2 tablet by mouth twice daily   montelukast  (SINGULAIR ) 10 MG tablet Take 1 tablet (10 mg total) by mouth daily.   potassium chloride  SA (KLOR-CON  M) 20 MEQ tablet TAKE 1 TABLET(20 MEQ) BY MOUTH TWICE DAILY   Safety Seal Miscellaneous MISC Apply 1 Application topically in the morning. Medication Name: Hormonic Hair Solution (Mioxidil 7%, Finasteride 0.05%)   triamcinolone  (NASACORT ) 55 MCG/ACT AERO nasal inhaler Place 1 spray into the nose 2 (two) times daily.    [DISCONTINUED] prochlorperazine  (COMPAZINE ) 10 MG tablet Take 1 tablet (10 mg total) by mouth every 6 (six) hours as needed (Nausea or vomiting).   Facility-Administered Encounter Medications as of 11/07/2023  Medication   0.9 %  sodium chloride  infusion   albuterol  (PROVENTIL ) (2.5 MG/3ML) 0.083% nebulizer solution 2.5 mg   sodium chloride  flush (NS) 0.9 % injection 10 mL   sodium chloride  flush (NS) 0.9 % injection 10 mL   sodium chloride  flush (NS) 0.9 % injection 10 mL   sodium chloride  flush (NS) 0.9 % injection 10 mL   sodium chloride  flush (NS) 0.9 % injection 10 mL   sodium chloride  flush (NS) 0.9 % injection 10 mL   sodium chloride  flush (NS) 0.9 % injection 10 mL    Allergies (verified) Codeine   History: Past Medical History:  Diagnosis Date   Avascular necrosis of femoral head (HCC)    bilateral per CT 07-26-2015   Endometrial carcinoma [facility]) gyn oncologist-  dr jerrol ([facility])/  radiation oncologist-- dr shannon   2013 dx  FIGO Stage 1A, Grade 2 endometrioid endometrial cancer s/p TAH w/ BSO and bilateral pelvic node dissection 10-31-2011 ;  recurrence at distal vagina 04/ 2014 s/p  brachytherapy (ended 07-29-2012);  2nd recurrence 12/ 2016  vaginal apex s/p  conformational radiotherapy 03-10-2015 to 04-20-2015   Family history of adverse reaction to anesthesia    mother ponv   GERD (gastroesophageal reflux disease)    Goals of care, counseling/discussion 08/21/2019   H/O stem cell transplant (HCC)    02/ 2000 and second one 06/ 2016   History of bacteremia    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   History of chemotherapy    last chemo 12-26-17   History of radiation therapy 6/4, 6/11, 6/19, 6/25, 07/29/2012   vagina 30.5 gray in 5 fx, HDR brachytherapy:   last radiation to vagina 03-10-2015 to 04-20-2015  50.4gray   History of radiation therapy 03/10/15-04/20/15   vagina 50.4 in 28 fractions   Hypertension    Lambda light chain myeloma  [facility]) oncologist-  dr timmy ([facility])  and  [facility] -- dr lela gasparetto   dx 07/ 1999 s/p  VAD chemotherapy 11/ 1999,  purged autotransplant 03-21-1998 followed by auto stem cell transplant 03-29-1998;  recurrance w/ second autologous stem cell transplant 07-24-2014;  in Re-mission currently , chemo maintenance therapy   Osteoporosis 12/18/05   Increased  risk    PONV (postoperative nausea and vomiting)    Wears glasses    Past Surgical History:  Procedure Laterality Date   ABDOMINAL HYSTERECTOMY  10/31/2011   Procedure: HYSTERECTOMY ABDOMINAL;  Surgeon: Sari jerrol, MD PHD;  Location: [facility];  Service: Gynecology;  Laterality: N/A;   CO2 LASER APPLICATION N/A 02/22/2016   Procedure: CO2 LASER APPLICATION;  Surgeon: Sari jerrol, MD;  Location: [facility]  Service: Gynecology;  Laterality: N/A;   CYSTOSCOPY WITH RETROGRADE PYELOGRAM, URETEROSCOPY AND STENT PLACEMENT Left 02/04/2018   Procedure: CYSTOSCOPY WITH RETROGRADE PYELOGRAM, URETEROSCOPY , bladder biopsy and fulgeration;  Surgeon: Sherrilee Belvie CROME, MD;  Location: [facility];  Service: Urology;  Laterality: Left;   ECTOPIC PREGNANCY SURGERY  1992   HYSTEROSCOPY WITH D & C  09/27/2011   Procedure: DILATATION AND CURETTAGE /HYSTEROSCOPY;  Surgeon: Shanda SHAUNNA Muscat, MD;  Location: [facility];  Service: Gynecology;;   IR FLUORO GUIDE PORT INSERTION RIGHT  09/08/2016   IR US  GUIDE VASC ACCESS RIGHT  09/08/2016   LAPAROTOMY  10/31/2011   Procedure: EXPLORATORY LAPAROTOMY;  Surgeon: Sari Bachelor, MD PHD;  Location: [facility];  Service: Gynecology;  Laterality: N/A;   SALPINGOOPHORECTOMY  10/31/2011   Procedure: SALPINGO OOPHERECTOMY;  Surgeon: Sari Bachelor, MD PHD;  Location: [facility];  Service: Gynecology;  Laterality: Bilateral;   Lymph Nodes sampling   TUBAL LIGATION  1986   Family History  Problem Relation Age of Onset   Colon cancer Mother    Hypertension Father    Heart Problems Father     Social History   Socioeconomic History   Marital status: Married    Spouse name: Not on file   Number of children: Not on file   Years of education: Not on file   Highest education level: Not on file  Occupational History   Occupation: retired  Tobacco Use   Smoking status: Never    Passive exposure: Never   Smokeless tobacco: Never  Vaping Use   Vaping status: Never Used  Substance and Sexual Activity   Alcohol use: Not Currently    Alcohol/week: 0.0 standard drinks of alcohol   Drug use: Never   Sexual activity: Yes    Birth control/protection: Surgical  Other Topics Concern   Not on file  Social History Narrative   Not on file   Social Drivers of Health   Financial Resource Strain: Low Risk  (11/07/2023)   Overall Financial Resource Strain (CARDIA)    Difficulty of Paying Living Expenses: Not hard at all  Food Insecurity: No Food Insecurity (11/07/2023)   Hunger Vital Sign    Worried About Running Out of Food in the Last Year: Never true    Ran Out of Food in the Last Year: Never true  Transportation Needs: No Transportation Needs (11/07/2023)   PRAPARE - Administrator, Civil Service (Medical): No    Lack of Transportation (Non-Medical): No  Physical Activity: Inactive (11/07/2023)   Exercise Vital Sign    Days of Exercise per Week: 0 days    Minutes of Exercise per Session: 0 min  Stress: No Stress Concern Present (11/07/2023)   [employer] of Occupational Health - Occupational Stress Questionnaire    Feeling of Stress: Not at all  Social Connections: Socially Integrated (11/07/2023)   Social Connection and Isolation Panel    Frequency of Communication with Friends and Family: Three times a week    Frequency of Social Gatherings with Friends and Family: Twice a week    Attends Religious Services: More than 4 times per year    Active Member of [employer] or Organizations: Yes    Attends Engineer, structural: More than 4 times per year     Marital Status: Married    Tobacco Counseling Counseling given: Not Answered    Clinical Intake:  Pre-visit preparation completed: Yes  Pain : No/denies pain     Nutritional Risks: None  Diabetes: No  Lab Results  Component Value Date   HGBA1C 5.6 03/05/2023   HGBA1C 5.7 (H) 06/01/2014   HGBA1C 5.9 03/22/2007     How often do you need to have someone help you when you read instructions, pamphlets, or other written materials from your doctor or pharmacy?: 1 - Never  Interpreter Needed?: No  Information entered by :: NAllen LPN   Activities of Daily Living     11/07/2023    9:48 AM  In your present state of health, do you have any difficulty performing the following activities:  Hearing? 0  Vision? 0  Difficulty concentrating or making decisions? 0  Walking or climbing stairs? 1  Comment sometimes  Dressing or bathing? 0  Doing errands, shopping? 0  Preparing Food and eating ? N  Using the Toilet? N  In the past six months, have you accidently leaked urine? N  Do you have problems with loss of bowel control? N  Managing your Medications? N  Managing your Finances? N  Housekeeping or managing your Housekeeping? N    Patient Care Team: Jarold Medici, MD as PCP - General (Internal Medicine) Rudy Dorothyann DASEN, RPH-CPP (Pharmacist) [facility]  I have updated your Care Teams any recent Medical Services you may have received from other providers in the past year.     Assessment:   This is a routine wellness examination for Emily Greer.  Hearing/Vision screen Hearing Screening - Comments:: Denies hearing issues Vision Screening - Comments:: [facility]   Goals Addressed             This Visit's Progress    Patient Stated       11/07/2023, wants to exercise more       Depression Screen     11/07/2023    9:53 AM 10/30/2023    2:48 PM 10/23/2023   10:00 AM 10/05/2023    9:00 AM 10/03/2023   10:33 AM 09/11/2023    9:18 AM 06/26/2023     3:39 PM  PHQ 2/9 Scores  PHQ - 2 Score 0 0 0 0 0 0 0  PHQ- 9 Score 0 0     0    Fall Risk     11/07/2023    9:52 AM 10/30/2023    2:48 PM 06/26/2023    3:39 PM 03/05/2023   11:56 AM 11/01/2022    2:14 PM  Fall Risk   Falls in the past year? 0 0 0 0 1  Comment     tripped over dishwasher door, tripped over suitcase  Number falls in past yr: 0 0 0 0 1  Injury with Fall? 0 0 0 0 0  Risk for fall due to : Medication side effect No Fall Risks No Fall Risks No Fall Risks Medication side effect;Impaired balance/gait  Follow up Falls evaluation completed;Falls prevention discussed Falls evaluation completed Falls evaluation completed Falls evaluation completed Falls prevention discussed;Falls evaluation completed    MEDICARE RISK AT HOME:  Medicare Risk at Home Any stairs in or around the home?: Yes If so, are there any without handrails?: No Home free of loose throw rugs in walkways, pet beds, electrical cords, etc?: Yes Adequate lighting in your home to reduce risk of falls?: Yes Life alert?: No Use of a cane, walker or w/c?: No Grab bars in the bathroom?: Yes Shower chair or bench in shower?: No  TIMED UP AND GO:  Was the test performed?  No  Cognitive Function: 6CIT  completed        11/07/2023    9:53 AM 11/01/2022    2:17 PM 10/12/2021    3:56 PM 09/29/2020    2:09 PM 09/24/2019   12:07 PM  6CIT Screen  What Year? 0 points 0 points 0 points 0 points 0 points  What month? 0 points 0 points 0 points 0 points 0 points  What time? 0 points 0 points 0 points 0 points 0 points  Count back from 20 0 points 0 points 0 points 0 points 0 points  Months in reverse 0 points 0 points 0 points 0 points 0 points  Repeat phrase 0 points 0 points 4 points 0 points 4 points  Total Score 0 points 0 points 4 points 0 points 4 points    Immunizations Immunization History  Administered Date(s) Administered   DTaP 08/25/2015, 10/28/2015   Fluad Quad(high Dose 65+) 11/19/2020, 10/12/2021    HIB (PRP-T) 08/25/2015, 10/28/2015   Hepatitis B, ADULT 08/25/2015, 10/28/2015   INFLUENZA, HIGH DOSE SEASONAL PF 11/12/2018   IPV 08/25/2015, 10/28/2015   Influenza Split 11/01/2011   Influenza,inj,Quad PF,6+ Mos 11/03/2015, 10/31/2017   Influenza-Unspecified 10/30/2013, 10/28/2014   PFIZER Comirnaty(Gray Top)Covid-19 Tri-Sucrose Vaccine 11/19/2020   PFIZER(Purple Top)SARS-COV-2 Vaccination 02/19/2019, 03/12/2019, 11/11/2019, 06/09/2020   Pneumococcal Conjugate-13 02/12/2015, 08/25/2015, 10/28/2015   Pneumococcal Polysaccharide-23 11/05/2013   Tdap 08/27/2015   Zoster Recombinant(Shingrix) 11/30/2021, 04/26/2022    Screening Tests Health Maintenance  Topic Date Due   Hepatitis C Screening  Never done   Mammogram  01/01/2023   COVID-19 Vaccine (6 - 2025-26 season) 10/01/2023   Pneumococcal Vaccine: 50+ Years (3 of 3 - PCV20 or PCV21) 03/04/2024 (Originally 11/06/2018)   Influenza Vaccine  04/29/2024 (Originally 08/31/2023)   Medicare Annual Wellness (AWV)  11/06/2024   DTaP/Tdap/Td (4 - Td or Tdap) 10/27/2025   Colonoscopy  02/14/2033   DEXA SCAN  Completed   Zoster Vaccines- Shingrix  Completed   Meningococcal B Vaccine  Aged Out   Hepatitis B Vaccines 19-59 Average Risk  Discontinued    Health Maintenance Items Addressed: Vaccines Due: covid  Additional Screening:  Vision Screening: Recommended annual ophthalmology exams for early detection of glaucoma and other disorders of the eye. Is the patient up to date with their annual eye exam?  Yes  Who is the provider or what is the name of the office in which the patient attends annual eye exams? [facility]  Dental Screening: Recommended annual dental exams for proper oral hygiene  Community Resource Referral / Chronic Care Management: CRR required this visit?  No   CCM required this visit?  No   Plan:    I have personally reviewed and noted the following in the patient's chart:   Medical and social history Use  of alcohol, tobacco or illicit drugs  Current medications and supplements including opioid prescriptions. Patient is not currently taking opioid prescriptions. Functional ability and status Nutritional status Physical activity Advanced directives List of other physicians Hospitalizations, surgeries, and ER visits in previous 12 months Vitals Screenings to include cognitive, depression, and falls Referrals and appointments  In addition, I have reviewed and discussed with patient certain preventive protocols, quality metrics, and best practice recommendations. A written personalized care plan for preventive services as well as general preventive health recommendations were provided to patient.   Emily FORBES Dawn, LPN   [id number]/01/[id number]   After Visit Summary: (MyChart) Due to this being a telephonic visit, the after visit summary with patients personalized plan  was offered to patient via MyChart   Notes: Nothing significant to report at this time.

## 2023-11-12 ENCOUNTER — Encounter: Payer: Self-pay | Admitting: Hematology & Oncology

## 2023-11-13 ENCOUNTER — Other Ambulatory Visit: Payer: Self-pay

## 2023-11-13 ENCOUNTER — Inpatient Hospital Stay

## 2023-11-13 ENCOUNTER — Inpatient Hospital Stay: Attending: Hematology & Oncology

## 2023-11-13 DIAGNOSIS — Z79899 Other long term (current) drug therapy: Secondary | ICD-10-CM | POA: Insufficient documentation

## 2023-11-13 DIAGNOSIS — C9000 Multiple myeloma not having achieved remission: Secondary | ICD-10-CM

## 2023-11-13 DIAGNOSIS — C541 Malignant neoplasm of endometrium: Secondary | ICD-10-CM | POA: Insufficient documentation

## 2023-11-13 DIAGNOSIS — D630 Anemia in neoplastic disease: Secondary | ICD-10-CM | POA: Insufficient documentation

## 2023-11-13 DIAGNOSIS — D509 Iron deficiency anemia, unspecified: Secondary | ICD-10-CM | POA: Insufficient documentation

## 2023-11-13 LAB — CBC WITH DIFFERENTIAL (CANCER CENTER ONLY)
Abs Immature Granulocytes: 0.03 K/uL (ref 0.00–0.07)
Basophils Absolute: 0.1 K/uL (ref 0.0–0.1)
Basophils Relative: 1 %
Eosinophils Absolute: 0.1 K/uL (ref 0.0–0.5)
Eosinophils Relative: 4 %
HCT: 33.3 % — ABNORMAL LOW (ref 36.0–46.0)
Hemoglobin: 10.8 g/dL — ABNORMAL LOW (ref 12.0–15.0)
Immature Granulocytes: 1 %
Lymphocytes Relative: 23 %
Lymphs Abs: 0.9 K/uL (ref 0.7–4.0)
MCH: 27 pg (ref 26.0–34.0)
MCHC: 32.4 g/dL (ref 30.0–36.0)
MCV: 83.3 fL (ref 80.0–100.0)
Monocytes Absolute: 0.5 K/uL (ref 0.1–1.0)
Monocytes Relative: 13 %
Neutro Abs: 2.2 K/uL (ref 1.7–7.7)
Neutrophils Relative %: 58 %
Platelet Count: 205 K/uL (ref 150–400)
RBC: 4 MIL/uL (ref 3.87–5.11)
RDW: 19.4 % — ABNORMAL HIGH (ref 11.5–15.5)
WBC Count: 3.7 K/uL — ABNORMAL LOW (ref 4.0–10.5)
nRBC: 0 % (ref 0.0–0.2)

## 2023-11-13 LAB — CMP (CANCER CENTER ONLY)
ALT: 17 U/L (ref 0–44)
AST: 20 U/L (ref 15–41)
Albumin: 4.2 g/dL (ref 3.5–5.0)
Alkaline Phosphatase: 68 U/L (ref 38–126)
Anion gap: 11 (ref 5–15)
BUN: 13 mg/dL (ref 8–23)
CO2: 25 mmol/L (ref 22–32)
Calcium: 9.9 mg/dL (ref 8.9–10.3)
Chloride: 105 mmol/L (ref 98–111)
Creatinine: 0.88 mg/dL (ref 0.44–1.00)
GFR, Estimated: >60 mL/min (ref >60)
Glucose, Bld: 96 mg/dL (ref 70–99)
Potassium: 4 mmol/L (ref 3.5–5.1)
Sodium: 142 mmol/L (ref 135–145)
Total Bilirubin: 0.4 mg/dL (ref 0.0–1.2)
Total Protein: 7.7 g/dL (ref 6.5–8.1)

## 2023-11-13 MED ORDER — EPOETIN ALFA-EPBX 40000 UNIT/ML IJ SOLN
40000.0000 [IU] | Freq: Once | INTRAMUSCULAR | Status: AC
  Administered 2023-11-13: 40000 [IU] via SUBCUTANEOUS

## 2023-11-13 NOTE — Patient Instructions (Signed)

## 2023-11-13 NOTE — Patient Instructions (Signed)

## 2023-11-21 ENCOUNTER — Ambulatory Visit (HOSPITAL_BASED_OUTPATIENT_CLINIC_OR_DEPARTMENT_OTHER)
Admission: RE | Admit: 2023-11-21 | Discharge: 2023-11-21 | Disposition: A | Discharge status: Home / Self Care | Source: Ambulatory Visit | Attending: Internal Medicine | Admitting: Internal Medicine

## 2023-11-21 DIAGNOSIS — I2510 Atherosclerotic heart disease of native coronary artery without angina pectoris: Secondary | ICD-10-CM | POA: Insufficient documentation

## 2023-11-21 DIAGNOSIS — I2584 Coronary atherosclerosis due to calcified coronary lesion: Secondary | ICD-10-CM | POA: Insufficient documentation

## 2023-11-21 LAB — ECHOCARDIOGRAM COMPLETE
AR max vel: 1.65 cm2
AV Area VTI: 1.66 cm2
AV Area mean vel: 1.67 cm2
AV Mean grad: 8.6 mmHg
AV Peak grad: 15.6 mmHg
AV Vena cont: 0.2 cm
Ao pk vel: 1.98 m/s
Area-P 1/2: 2.26 cm2
Calc EF: 61.7 %
MV M vel: 5.27 m/s
MV Peak grad: 111.1 mmHg
S' Lateral: 2.2 cm
Single Plane A2C EF: 59.9 %
Single Plane A4C EF: 67.5 %

## 2023-11-29 ENCOUNTER — Ambulatory Visit (INDEPENDENT_AMBULATORY_CARE_PROVIDER_SITE_OTHER): Admitting: Allergy & Immunology

## 2023-11-29 ENCOUNTER — Encounter: Payer: Self-pay | Admitting: Hematology & Oncology

## 2023-11-29 ENCOUNTER — Encounter: Payer: Self-pay | Admitting: Allergy & Immunology

## 2023-11-29 VITALS — BP 128/80 | HR 73 | Temp 97.1°F | Ht 64.0 in | Wt 176.5 lb

## 2023-11-29 DIAGNOSIS — J310 Chronic rhinitis: Secondary | ICD-10-CM

## 2023-11-29 DIAGNOSIS — C9001 Multiple myeloma in remission: Secondary | ICD-10-CM | POA: Diagnosis not present

## 2023-11-29 MED ORDER — FLUTICASONE PROPIONATE 50 MCG/ACT NA SUSP: 1.0000 | Freq: Two times a day (BID) | NASAL | 5 refills | Status: DC

## 2023-11-29 MED ORDER — AZELASTINE HCL 0.1 % NA SOLN: 1.0000 | Freq: Two times a day (BID) | NASAL | 5 refills | Status: DC

## 2023-11-29 NOTE — Progress Notes (Signed)
 NEW PATIENT  Date of Service/Encounter:  11/29/23  Consult requested by: Jarold Medici, MD   Assessment:   Chronic rhinitis - planning for skin testing at the next visit  Multiple myeloma in remission  Singulair  non-responder   History of multiple myeloma   Plan/Recommendations:   1. Chronic rhinitis - Because of insurance stipulations, we cannot do skin testing on the same day as your first visit. - We are all working to fight this, but for now we need to do two separate visits.  - We will know more after we do testing at the next visit.  - The skin testing visit can be squeezed in at your convenience.  - Then we can make a more full plan to address all of your symptoms. - Be sure to stop your antihistamines for 3 days before this appointment - STOP the Singulair  (montelukast ) since this does not seem to be helping at all).  - Restart the Flonase  (fluticasone ) one spray per nostril twice daily (aim for the ears). - START Astelin  (azelastine ) one spray per nostril twice daily (aim for the ears).  2. Multiple myeloma in remission - I will send my note to Dr. Timmy to keep him in the loop.  3. Return in about 1 year (around 11/28/2024) for SKIN TESTING (1-55). You can have the follow up appointment with Dr. Iva or a Nurse Practicioner (our Nurse Practitioners are excellent and always have Physician oversight!).   This note in its entirety was forwarded to the Provider who requested this consultation.  Subjective:   Emily Greer is a 73 y.o. female presenting today for evaluation of  Chief Complaint  Patient presents with   Establish Care    Pt is here to establish care. She c/o right sided nasal drainage, she is blowing her nose a lot. Symptoms presented approx 2 months ago which have gotten worse over time. Nasal drip symptom is ongoing, consistent/daily.  She denies other allergy related symptoms. She does have a mild cough that is being triggered by  something but she does not know what is causing the cough, cough is intermittent due to unknown triggers.    Emily Greer has a history of the following: Patient Active Problem List   Diagnosis Date Noted   Nasal congestion 11/07/2023   Seasonal allergic rhinitis 11/07/2023   Coronary artery calcification of native artery 11/04/2023   Obesity with body mass index 30 or greater 04/25/2023   Stem cells transplant status (HCC) 03/12/2023   Memory changes 03/06/2023   Vitamin B12 deficiency 03/06/2023   Allergic rhinitis with postnasal drip 03/06/2023   Imbalance 11/12/2022   Hives 09/27/2022   Chemotherapy-induced neutropenia 08/29/2022   Urinary frequency 03/03/2022   Mastalgia in female 12/29/2020   Class 1 obesity due to excess calories with serious comorbidity and body mass index (BMI) of 30.0 to 30.9 in adult 12/29/2020   Goals of care, counseling/discussion 08/21/2019   Osteonecrosis, unspecified (HCC) 03/31/2019   VAIN I (vaginal intraepithelial neoplasia grade I) 10/10/2018   Influenza B 02/18/2018   Flu-like symptoms 02/18/2018   Excessive cerumen in both ear canals 02/18/2018   Abnormal Pap smear of vagina 12/30/2015   Atypical squamous cells of undetermined significance (ASC-US ) on cervical Pap smear 04/16/2015   Cervical high risk HPV (human papillomavirus) test positive 04/16/2015   Iron deficiency anemia due to chronic blood loss    HCAP (healthcare-associated pneumonia) 12/29/2014   Lower urinary tract infectious disease 12/29/2014   Hypokalemia  12/29/2014   CAP (community acquired pneumonia) 12/29/2014   Bone marrow transplant status (HCC) 07/25/2014   S/P autologous bone marrow transplantation (HCC) 07/25/2014   Fever 05/31/2014   ARF (acute renal failure) 05/31/2014   Other pancytopenia (HCC) 05/31/2014   Renal failure    Examination of participant in clinical trial 05/19/2014   Encounter for examination for normal comparison or control in clinical  research program 05/19/2014   Kahler disease (HCC) 05/05/2014   Multiple myeloma in remission (HCC) 05/05/2014   History of radiation therapy    Endometrial carcinoma (HCC) 09/28/2011   Pregnancy induced hypertension    Elevated hemoglobin A1c    Myeloma (HCC) 01/17/2011   Lambda light chain myeloma (HCC) 11/11/2007   Vaginal atrophy 12/19/2006   Increased BMI 12/18/2005   Hypertension 11/28/1997   H/O multiple myeloma 11/28/1997   Staphylococcus aureus bacteremia 11/28/1997   Staphylococcus epidermidis bacteremia 11/28/1997   Essential (primary) hypertension 11/28/1997    History obtained from: chart review and patient.  Discussed the use of AI scribe software for clinical note transcription with the patient and/or guardian, who gave verbal consent to proceed.  Emily Greer was referred by Jarold Medici, MD.     Ziasia is a 73 y.o. female presenting for an evaluation of chronic rhinitis and recurrent sinusitis.  Allergic Rhinitis Symptom History: She has been experiencing sinus issues since right before Thanksgiving 2024, characterized by persistent congestion primarily at night, which sometimes causes her to sit up to breathe. The congestion is predominantly on the right side, with drainage from the right nostril. She has tried various treatments including Vicks nasal spray and a nasal spray prescribed by Dr. Jarold, which she finds aggravating and that does not seem to work. She also uses an over-the-counter medication for people with high blood pressure, which seems to help dry up the congestion a little bit.  Infection Symptom History: She denies having asthma or eczema and reports no significant history of allergies, although her mother suffered from them. She has experienced pneumonia in the past, possibly around the year 2000, during a stem cell transplant. She believes that she has received a pneumonia vaccine at a drugstore since then, but is not certain.  Her past  medical history is significant for multiple myeloma, diagnosed in 1999, for which she has undergone two stem cell transplants at [facility] and CAR T-cell therapy in [place] two years ago. She sees Dr. Timmy (Hematology) every six to eight weeks for follow-up related to her multiple myeloma.  She takes metoprolol  for high blood pressure and Singulair  (montelukast ), although she is unsure of its effectiveness. She has not had her COVID or flu vaccines this year but plans to get them at the end of the month.  She is a retired paediatric nurse, having retired in 2017. She has two grandchildren, aged ten and five, whom she sees frequently as they attend her church.  Otherwise, there is no history of other atopic diseases, including drug allergies, stinging insect allergies, or contact dermatitis. There is no significant infectious history. Vaccinations are up to date.    Past Medical History: Patient Active Problem List   Diagnosis Date Noted   Nasal congestion 11/07/2023   Seasonal allergic rhinitis 11/07/2023   Coronary artery calcification of native artery 11/04/2023   Obesity with body mass index 30 or greater 04/25/2023   Stem cells transplant status (HCC) 03/12/2023   Memory changes 03/06/2023   Vitamin B12 deficiency 03/06/2023   Allergic rhinitis with  postnasal drip 03/06/2023   Imbalance 11/12/2022   Hives 09/27/2022   Chemotherapy-induced neutropenia 08/29/2022   Urinary frequency 03/03/2022   Mastalgia in female 12/29/2020   Class 1 obesity due to excess calories with serious comorbidity and body mass index (BMI) of 30.0 to 30.9 in adult 12/29/2020   Goals of care, counseling/discussion 08/21/2019   Osteonecrosis, unspecified (HCC) 03/31/2019   VAIN I (vaginal intraepithelial neoplasia grade I) 10/10/2018   Influenza B 02/18/2018   Flu-like symptoms 02/18/2018   Excessive cerumen in both ear canals 02/18/2018   Abnormal Pap smear of vagina 12/30/2015   Atypical squamous  cells of undetermined significance (ASC-US ) on cervical Pap smear 04/16/2015   Cervical high risk HPV (human papillomavirus) test positive 04/16/2015   Iron deficiency anemia due to chronic blood loss    HCAP (healthcare-associated pneumonia) 12/29/2014   Lower urinary tract infectious disease 12/29/2014   Hypokalemia 12/29/2014   CAP (community acquired pneumonia) 12/29/2014   Bone marrow transplant status (HCC) 07/25/2014   S/P autologous bone marrow transplantation (HCC) 07/25/2014   Fever 05/31/2014   ARF (acute renal failure) 05/31/2014   Other pancytopenia (HCC) 05/31/2014   Renal failure    Examination of participant in clinical trial 05/19/2014   Encounter for examination for normal comparison or control in clinical research program 05/19/2014   Kahler disease (HCC) 05/05/2014   Multiple myeloma in remission (HCC) 05/05/2014   History of radiation therapy    Endometrial carcinoma (HCC) 09/28/2011   Pregnancy induced hypertension    Elevated hemoglobin A1c    Myeloma (HCC) 01/17/2011   Lambda light chain myeloma (HCC) 11/11/2007   Vaginal atrophy 12/19/2006   Increased BMI 12/18/2005   Hypertension 11/28/1997   H/O multiple myeloma 11/28/1997   Staphylococcus aureus bacteremia 11/28/1997   Staphylococcus epidermidis bacteremia 11/28/1997   Essential (primary) hypertension 11/28/1997    Medication List:  Allergies as of 11/29/2023       Reactions   Codeine Nausea Only        Medication List        Accurate as of November 29, 2023 11:47 AM. If you have any questions, ask your nurse or doctor.          STOP taking these medications    aspirin EC 81 MG tablet Stopped by: Marty Morton Shaggy   montelukast  10 MG tablet Commonly known as: SINGULAIR  Stopped by: Marty Morton Shaggy       TAKE these medications    acyclovir  400 MG tablet Commonly known as: ZOVIRAX  TAKE 1 TABLET(400 MG) BY MOUTH TWICE DAILY   albuterol  108 (90 Base) MCG/ACT  inhaler Commonly known as: VENTOLIN  HFA Inhale 2 puffs into the lungs every 6 (six) hours as needed for wheezing or shortness of breath. 2 puffs 3 times daily x 5 days then every 6 hours as needed.   amLODipine  10 MG tablet Commonly known as: NORVASC  TAKE 1 TABLET(10 MG) BY MOUTH EVERY MORNING   benzonatate  100 MG capsule Commonly known as: Tessalon  Perles Take 1 capsule (100 mg total) by mouth 3 (three) times daily as needed for cough.   calcium-vitamin D  500-5 MG-MCG tablet Commonly known as: OSCAL WITH D Take 1 tablet by mouth daily.   chlorhexidine 0.12 % solution Commonly known as: PERIDEX 10 mLs 2 (two) times daily.   letrozole  2.5 MG tablet Commonly known as: FEMARA  Take 1 tablet (2.5 mg total) by mouth daily.   levocetirizine 5 MG tablet Commonly known as: Xyzal  Allergy 24HR Take 1  tablet (5 mg total) by mouth every evening.   metoprolol  tartrate 50 MG tablet Commonly known as: LOPRESSOR  Take 1/2 tablet by mouth twice daily   potassium chloride  SA 20 MEQ tablet Commonly known as: KLOR-CON  M TAKE 1 TABLET(20 MEQ) BY MOUTH TWICE DAILY   Safety Seal Miscellaneous Misc Apply 1 Application topically in the morning. Medication Name: Hormonic Hair Solution (Mioxidil 7%, Finasteride 0.05%)   triamcinolone  55 MCG/ACT Aero nasal inhaler Commonly known as: NASACORT  Place 1 spray into the nose 2 (two) times daily.   Vitamin D3 50 MCG (2000 UT) Tabs Take 2 tablets by mouth daily.        Birth History: non-contributory  Developmental History: non-contributory  Past Surgical History: Past Surgical History:  Procedure Laterality Date   ABDOMINAL HYSTERECTOMY  10/31/2011   Procedure: HYSTERECTOMY ABDOMINAL;  Surgeon: Sari Bachelor, MD PHD;  Location: [facility];  Service: Gynecology;  Laterality: N/A;   CO2 LASER APPLICATION N/A 02/22/2016   Procedure: CO2 LASER APPLICATION;  Surgeon: Sari Bachelor, MD;  Location[facility];  Service: Gynecology;   Laterality: N/A;   CYSTOSCOPY WITH RETROGRADE PYELOGRAM, URETEROSCOPY AND STENT PLACEMENT Left 02/04/2018   Procedure: CYSTOSCOPY WITH RETROGRADE PYELOGRAM, URETEROSCOPY , bladder biopsy and fulgeration;  Surgeon: Sherrilee Belvie CROME, MD;  Location: [facility];  Service: Urology;  Laterality: Left;   ECTOPIC PREGNANCY SURGERY  1992   HYSTEROSCOPY WITH D & C  09/27/2011   Procedure: DILATATION AND CURETTAGE /HYSTEROSCOPY;  Surgeon: Shanda SHAUNNA Muscat, MD;  Location: [facility];  Service: Gynecology;;   IR FLUORO GUIDE PORT INSERTION RIGHT  09/08/2016   IR US  GUIDE VASC ACCESS RIGHT  09/08/2016   LAPAROTOMY  10/31/2011   Procedure: EXPLORATORY LAPAROTOMY;  Surgeon: Sari Bachelor, MD PHD;  Location: [facility];  Service: Gynecology;  Laterality: N/A;   SALPINGOOPHORECTOMY  10/31/2011   Procedure: SALPINGO OOPHERECTOMY;  Surgeon: Sari Bachelor, MD PHD;  Location: [facility];  Service: Gynecology;  Laterality: Bilateral;   Lymph Nodes sampling   TUBAL LIGATION  1986     Family History: Family History  Problem Relation Age of Onset   Allergic rhinitis Mother    Colon cancer Mother    Hypertension Father    Heart Problems Father      Social History: Rayetta lives at home with her husband.  They have a couple grandkids who live in [place].  They live in a house.  There is no mold or water  damage.  There is carpeting throughout the home.  They have electric heating and central cooling.  There is supplemental gas heating via the fireplace.  There are dogs and cats outside of the home.  There are no dust mite covers on the bedding.  There is no tobacco exposure.  She is a retired museum/gallery curator.  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  There is no fume, chemical, or dust exposure.  There is no HEPA filter in the home.   Review of systems otherwise negative other than that mentioned in the HPI.    Objective:   Blood pressure 128/80, pulse 73,  temperature (!) 97.1 F (36.2 C), temperature source Temporal, height 5' 4 (1.626 m), weight 176 lb 8 oz (80.1 kg), SpO2 100%. Body mass index is 30.3 kg/m.     Physical Exam Vitals reviewed.  Constitutional:      Appearance: She is well-developed.  HENT:     Head: Normocephalic and atraumatic.  Right Ear: Tympanic membrane, ear canal and external ear normal. No drainage, swelling or tenderness. Tympanic membrane is not injected, scarred, erythematous, retracted or bulging.     Left Ear: Tympanic membrane, ear canal and external ear normal. No drainage, swelling or tenderness. Tympanic membrane is not injected, scarred, erythematous, retracted or bulging.     Nose: No nasal deformity, septal deviation, mucosal edema or rhinorrhea.     Right Turbinates: Enlarged, swollen and pale.     Left Turbinates: Enlarged, swollen and pale.     Right Sinus: No maxillary sinus tenderness or frontal sinus tenderness.     Left Sinus: No maxillary sinus tenderness or frontal sinus tenderness.     Mouth/Throat:     Mouth: Mucous membranes are not pale and not dry.     Pharynx: Uvula midline.  Eyes:     General:        Right eye: No discharge.        Left eye: No discharge.     Conjunctiva/sclera: Conjunctivae normal.     Right eye: Right conjunctiva is not injected. No chemosis.    Left eye: Left conjunctiva is not injected. No chemosis.    Pupils: Pupils are equal, round, and reactive to light.  Cardiovascular:     Rate and Rhythm: Normal rate and regular rhythm.     Heart sounds: Normal heart sounds.  Pulmonary:     Effort: Pulmonary effort is normal. No tachypnea, accessory muscle usage or respiratory distress.     Breath sounds: Normal breath sounds. No wheezing, rhonchi or rales.     Comments: Moving air well in all lung fields.  No increased work of breathing. Chest:     Chest wall: No tenderness.  Abdominal:     Tenderness: There is no abdominal tenderness. There is no guarding or  rebound.  Lymphadenopathy:     Head:     Right side of head: No submandibular, tonsillar or occipital adenopathy.     Left side of head: No submandibular, tonsillar or occipital adenopathy.     Cervical: No cervical adenopathy.  Skin:    Coloration: Skin is not pale.     Findings: No abrasion, erythema, petechiae or rash. Rash is not papular, urticarial or vesicular.  Neurological:     Mental Status: She is alert.  Psychiatric:        Behavior: Behavior is cooperative.      Diagnostic studies: deferred due to insurance stipulations that require a separate visit for testing         Marty Shaggy, MD [facility] 

## 2023-11-29 NOTE — Patient Instructions (Addendum)
 1. Chronic rhinitis - Because of insurance stipulations, we cannot do skin testing on the same day as your first visit. - We are all working to fight this, but for now we need to do two separate visits.  - We will know more after we do testing at the next visit.  - The skin testing visit can be squeezed in at your convenience.  - Then we can make a more full plan to address all of your symptoms. - Be sure to stop your antihistamines for 3 days before this appointment - STOP the Singulair  (montelukast ) since this does not seem to be helping at all).  - Restart the Flonase  (fluticasone ) one spray per nostril twice daily (aim for the ears). - START Astelin  (azelastine ) one spray per nostril twice daily (aim for the ears).  2. Multiple myeloma in remission - I will send my note to Dr. Timmy to keep him in the loop.  3. Return in about 1 year (around 11/28/2024) for SKIN TESTING (1-55). You can have the follow up appointment with Dr. Iva or a Nurse Practicioner (our Nurse Practitioners are excellent and always have Physician oversight!).    Please inform us  of any Emergency Department visits, hospitalizations, or changes in symptoms. Call us  before going to the ED for breathing or allergy symptoms since we might be able to fit you in for a sick visit. Feel free to contact us  anytime with any questions, problems, or concerns.  It was a pleasure to meet you today!  Websites that have reliable patient information: 1. American Academy of Asthma, Allergy, and Immunology: www.aaaai.org 2. Food Allergy Research and Education (FARE): foodallergy.org 3. Mothers of Asthmatics: http://www.asthmacommunitynetwork.org 4. American College of Allergy, Asthma, and Immunology: www.acaai.org      "Like" us  on Facebook and Instagram for our latest updates!      A healthy democracy works best when [employer] participate! Make sure you are registered to vote! If you have moved or changed any of your  contact information, you will need to get this updated before voting! Scan the QR codes below to learn more!

## 2023-11-30 ENCOUNTER — Encounter: Payer: Self-pay | Admitting: Hematology & Oncology

## 2023-12-03 ENCOUNTER — Other Ambulatory Visit: Payer: Self-pay | Admitting: *Deleted

## 2023-12-03 DIAGNOSIS — C9000 Multiple myeloma not having achieved remission: Secondary | ICD-10-CM

## 2023-12-04 ENCOUNTER — Encounter: Payer: Self-pay | Admitting: Hematology & Oncology

## 2023-12-04 ENCOUNTER — Inpatient Hospital Stay

## 2023-12-04 ENCOUNTER — Inpatient Hospital Stay: Attending: Hematology & Oncology | Admitting: Hematology & Oncology

## 2023-12-04 DIAGNOSIS — C9000 Multiple myeloma not having achieved remission: Secondary | ICD-10-CM | POA: Insufficient documentation

## 2023-12-04 DIAGNOSIS — C541 Malignant neoplasm of endometrium: Secondary | ICD-10-CM | POA: Insufficient documentation

## 2023-12-04 DIAGNOSIS — Z79899 Other long term (current) drug therapy: Secondary | ICD-10-CM | POA: Diagnosis not present

## 2023-12-04 LAB — CBC WITH DIFFERENTIAL (CANCER CENTER ONLY)
Abs Immature Granulocytes: 0.02 K/uL (ref 0.00–0.07)
Basophils Absolute: 0.1 K/uL (ref 0.0–0.1)
Basophils Relative: 2 %
Eosinophils Absolute: 0.2 K/uL (ref 0.0–0.5)
Eosinophils Relative: 4 %
HCT: 35 % — ABNORMAL LOW (ref 36.0–46.0)
Hemoglobin: 11.2 g/dL — ABNORMAL LOW (ref 12.0–15.0)
Immature Granulocytes: 1 %
Lymphocytes Relative: 24 %
Lymphs Abs: 0.9 K/uL (ref 0.7–4.0)
MCH: 26.6 pg (ref 26.0–34.0)
MCHC: 32 g/dL (ref 30.0–36.0)
MCV: 83.1 fL (ref 80.0–100.0)
Monocytes Absolute: 0.4 K/uL (ref 0.1–1.0)
Monocytes Relative: 11 %
Neutro Abs: 2.2 K/uL (ref 1.7–7.7)
Neutrophils Relative %: 58 %
Platelet Count: 181 K/uL (ref 150–400)
RBC: 4.21 MIL/uL (ref 3.87–5.11)
RDW: 18.9 % — ABNORMAL HIGH (ref 11.5–15.5)
WBC Count: 3.8 K/uL — ABNORMAL LOW (ref 4.0–10.5)
nRBC: 0 % (ref 0.0–0.2)

## 2023-12-04 LAB — CMP (CANCER CENTER ONLY)
ALT: 8 U/L (ref 0–44)
AST: 20 U/L (ref 15–41)
Albumin: 4.2 g/dL (ref 3.5–5.0)
Alkaline Phosphatase: 73 U/L (ref 38–126)
Anion gap: 9 (ref 5–15)
BUN: 16 mg/dL (ref 8–23)
CO2: 27 mmol/L (ref 22–32)
Calcium: 10.2 mg/dL (ref 8.9–10.3)
Chloride: 103 mmol/L (ref 98–111)
Creatinine: 0.93 mg/dL (ref 0.44–1.00)
GFR, Estimated: >60 mL/min (ref >60)
Glucose, Bld: 106 mg/dL — ABNORMAL HIGH (ref 70–99)
Potassium: 3.6 mmol/L (ref 3.5–5.1)
Sodium: 138 mmol/L (ref 135–145)
Total Bilirubin: 0.4 mg/dL (ref 0.0–1.2)
Total Protein: 7.9 g/dL (ref 6.5–8.1)

## 2023-12-04 LAB — LACTATE DEHYDROGENASE: LDH: 197 U/L — ABNORMAL HIGH (ref 98–192)

## 2023-12-04 LAB — IRON AND IRON BINDING CAPACITY (CC-WL,HP ONLY)
Iron: 85 ug/dL (ref 28–170)
Saturation Ratios: 30 % (ref 10.4–31.8)
TIBC: 281 ug/dL (ref 250–450)
UIBC: 196 ug/dL

## 2023-12-04 LAB — FERRITIN: Ferritin: 1289 ng/mL — ABNORMAL HIGH (ref 11–307)

## 2023-12-04 NOTE — Patient Instructions (Signed)

## 2023-12-05 ENCOUNTER — Other Ambulatory Visit: Payer: Self-pay | Admitting: Family Medicine

## 2023-12-05 LAB — KAPPA/LAMBDA LIGHT CHAINS
Kappa free light chain: 26.9 mg/L — ABNORMAL HIGH (ref 3.3–19.4)
Kappa, lambda light chain ratio: 1.01 (ref 0.26–1.65)
Lambda free light chains: 26.7 mg/L — ABNORMAL HIGH (ref 5.7–26.3)

## 2023-12-06 LAB — PROTEIN ELECTROPHORESIS, SERUM, WITH REFLEX
A/G Ratio: 0.9 (ref 0.7–1.7)
Albumin ELP: 3.3 g/dL (ref 2.9–4.4)
Alpha-1-Globulin: 0.3 g/dL (ref 0.0–0.4)
Alpha-2-Globulin: 0.9 g/dL (ref 0.4–1.0)
Beta Globulin: 1 g/dL (ref 0.7–1.3)
Gamma Globulin: 1.6 g/dL (ref 0.4–1.8)
Globulin, Total: 3.8 g/dL (ref 2.2–3.9)
Total Protein ELP: 7.1 g/dL (ref 6.0–8.5)

## 2023-12-06 LAB — IGG, IGA, IGM
IgA: 145 mg/dL (ref 64–422)
IgG (Immunoglobin G), Serum: 1886 mg/dL — ABNORMAL HIGH (ref 586–1602)
IgM (Immunoglobulin M), Srm: 73 mg/dL (ref 26–217)

## 2023-12-08 NOTE — Progress Notes (Unsigned)
 She is Hematology and Oncology Follow Up Visit  Emily Greer [id number] 20-Jan-1951 73 y.o. 12/08/2023   Principle Diagnosis:  Recurrent lambda light chain myeloma - nl cytogenetics History of recurrent endometrial carcinoma  - NED since early 2017 - MSI HIGH/MMR def Iron deficiency anemia -blood loss   Past Therapy:             Status post second autologous stem cell transplant on 07/24/2014 Maintenance therapy with Pomalidomide marica 2 week Velcade  - d/c'ed Radiation therapy for endometrial recurrence - completed 04/20/2015 Pomalyst /Kyprolis  70mg /m2 IV q 2 weeks - s/p cycle #12 - held on 12/26/2017 for hematuria Daratumumab /Pomalyst  (1 mg) - status post cycle 19 -- d/c on 08/21/2019 Melflufen 40 mg IV q 4 weeks -- started on 08/27/2019, s/p cycle #2 --  D/c due to FDA removal Selinexor  60 mg po q week -- start on 01/19/2020 -- changed on 03/18/2020 -- d/c on 04/27/2020 Blenrep  2.5 mg/m2 IV q 3 weeks -- started on 05/20/2020, s/p cycle #7 --D/C secondary to progression on 12/22/2020 Carfilzomib /Sarclisa  -- s/p cycle #3 -- start on 01/03/2021 Cytoxan /Pomalyst /decadron  -- start cycle #1 on 06/15/2021 - d/c 06/2021 S/p Cilta-cel CAR-T infusion -- 07/22/2021 Xgeva - D/c following 07/31/20224 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  So far, everything seems to be going pretty well.  She really has had no problems.  She has had no issues after the CAR-T therapy.  Is now been almost 2-1/2 years that she had the CAR-T therapy.  She is still in remission.  She does get Retacrit .  She does get IV iron.  Today, her ferritin was 1289 with an iron saturation of 30%.  A lot of the ferritin elevation is more inflammatory.  Her lambda  light chain level today was 2.7 mg/dL.  She will need to see a gynecologist.  Her gynecologist I think retired.  Will see about having to make a referral.  She has had no problems with fever.  She has had no problems with COVID.  She has had no rashes.  There has been occasional leg swelling.  She does take diuretics on occasion.  She has had no cough.  There has been no nausea or vomiting.  Her history of endometrial cancer is now too much of a problem.  She continues on letrozole .  It has now been 8 years since she had the surgery.  Of note, she does have MSI and MMR abnormalities so she would certainly be a candidate for immunotherapy if anything were to recur.  Overall, I would have to say that her performance status is probably ECOG 1.    Wt Readings from Last 3 Encounters:  12/04/23 176 lb 12.8 oz (80.2 kg)  11/29/23 176 lb 8 oz (80.1 kg)  10/30/23 177 lb (80.3 kg)   Medications:  Allergies as of 12/04/2023       Reactions   Codeine Nausea Only        Medication List        Accurate as of December 04, 2023 11:59 PM. If you have any questions, ask your nurse or doctor.          acyclovir  400 MG tablet  Commonly known as: ZOVIRAX  TAKE 1 TABLET(400 MG) BY MOUTH TWICE DAILY   albuterol  108 (90 Base) MCG/ACT inhaler Commonly known as: VENTOLIN  HFA Inhale 2 puffs into the lungs every 6 (six) hours as needed for wheezing or shortness of breath. 2 puffs 3 times daily x 5 days then every 6 hours as needed.   amLODipine  10 MG tablet Commonly known as: NORVASC  TAKE 1 TABLET(10 MG) BY MOUTH EVERY MORNING   Aspirin 81 MG Caps 1 capsule daily.   azelastine  0.1 % nasal spray Commonly known as: ASTELIN  Place 1 spray into both nostrils 2 (two) times daily. Use in each nostril as directed   calcium-vitamin D  500-5 MG-MCG tablet Commonly known as: OSCAL WITH D Take 1 tablet by mouth daily.   chlorhexidine 0.12 % solution Commonly known as: PERIDEX 10 mLs 2 (two) times  daily.   fluticasone  50 MCG/ACT nasal spray Commonly known as: FLONASE  Place 1 spray into both nostrils in the morning and at bedtime.   letrozole  2.5 MG tablet Commonly known as: FEMARA  Take 1 tablet (2.5 mg total) by mouth daily.   levocetirizine 5 MG tablet Commonly known as: Xyzal  Allergy 24HR Take 1 tablet (5 mg total) by mouth every evening.   metoprolol  tartrate 50 MG tablet Commonly known as: LOPRESSOR  Take 1/2 tablet by mouth twice daily   potassium chloride  SA 20 MEQ tablet Commonly known as: KLOR-CON  M TAKE 1 TABLET(20 MEQ) BY MOUTH TWICE DAILY   Safety Seal Miscellaneous Misc Apply 1 Application topically in the morning. Medication Name: Hormonic Hair Solution (Mioxidil 7%, Finasteride 0.05%)   Vitamin D3 50 MCG (2000 UT) Tabs Take 2 tablets by mouth daily.        Allergies:  Allergies  Allergen Reactions   Codeine Nausea Only    Past Medical History, Surgical history, Social history, and Family History were reviewed and updated.  Review of Systems: Review of Systems  Constitutional: Negative.   HENT: Negative.    Eyes:  Positive for blurred vision.  Respiratory: Negative.    Cardiovascular: Negative.   Gastrointestinal: Negative.   Genitourinary: Negative.   Musculoskeletal: Negative.   Skin: Negative.   Neurological: Negative.   Endo/Heme/Allergies: Negative.   Psychiatric/Behavioral: Negative.     Physical Exam:  Her vital signs are temperature of 98.8.  Pulse 51.  Blood pressure 135/74.  Weight is 176 pounds.   Wt Readings from Last 3 Encounters:  12/04/23 176 lb 12.8 oz (80.2 kg)  11/29/23 176 lb 8 oz (80.1 kg)  10/30/23 177 lb (80.3 kg)    Physical Exam Vitals reviewed.  HENT:     Head: Normocephalic and atraumatic.  Eyes:     Pupils: Pupils are equal, round, and reactive to light.  Cardiovascular:     Rate and Rhythm: Normal rate and regular rhythm.     Heart sounds: Normal heart sounds.  Pulmonary:     Effort: Pulmonary  effort is normal.     Breath sounds: Normal breath sounds.  Abdominal:     General: Bowel sounds are normal.     Palpations: Abdomen is soft.  Musculoskeletal:        General: No tenderness or deformity. Normal range of motion.     Cervical back: Normal range of motion.  Lymphadenopathy:     Cervical: No cervical adenopathy.  Skin:    General: Skin is warm and dry.     Findings: No erythema or rash.  Neurological:     Mental Status: She is  alert and oriented to person, place, and time.  Psychiatric:        Behavior: Behavior normal.        Thought Content: Thought content normal.        Judgment: Judgment normal.      Lab Results  Component Value Date   WBC 3.8 (L) 12/04/2023   HGB 11.2 (L) 12/04/2023   HCT 35.0 (L) 12/04/2023   MCV 83.1 12/04/2023   PLT 181 12/04/2023   Lab Results  Component Value Date   FERRITIN 1,289 (H) 12/04/2023   IRON 85 12/04/2023   TIBC 281 12/04/2023   UIBC 196 12/04/2023   IRONPCTSAT 30 12/04/2023   Lab Results  Component Value Date   RETICCTPCT 1.6 09/11/2023   RBC 4.21 12/04/2023   Lab Results  Component Value Date   KPAFRELGTCHN 26.9 (H) 12/04/2023   LAMBDASER 26.7 (H) 12/04/2023   KAPLAMBRATIO 1.01 12/04/2023   Lab Results  Component Value Date   IGGSERUM 1,886 (H) 12/04/2023   IGA 145 12/04/2023   IGMSERUM 73 12/04/2023   Lab Results  Component Value Date   TOTALPROTELP 7.1 12/04/2023   ALBUMINELP 3.3 12/04/2023   A1GS 0.3 12/04/2023   A2GS 0.9 12/04/2023   BETS 1.0 12/04/2023   BETA2SER 0.4 11/23/2014   GAMS 1.6 12/04/2023   MSPIKE Not Observed 12/04/2023   SPEI Comment 10/19/2020     Chemistry      Component Value Date/Time   NA 138 12/04/2023 0949   NA 142 07/04/2022 1248   NA 141 01/10/2017 1115   NA 140 06/21/2016 0918   K 3.6 12/04/2023 0949   K 4.0 01/10/2017 1115   K 4.3 06/21/2016 0918   CL 103 12/04/2023 0949   CL 106 01/10/2017 1115   CO2 27 12/04/2023 0949   CO2 27 01/10/2017 1115   CO2 20  (L) 06/21/2016 0918   BUN 16 12/04/2023 0949   BUN 12 07/04/2022 1248   BUN 15 01/10/2017 1115   BUN 15.8 06/21/2016 0918   CREATININE 0.93 12/04/2023 0949   CREATININE 1.0 01/10/2017 1115   CREATININE 0.8 06/21/2016 0918      Component Value Date/Time   CALCIUM 10.2 12/04/2023 0949   CALCIUM 9.5 01/10/2017 1115   CALCIUM 9.4 06/21/2016 0918   ALKPHOS 73 12/04/2023 0949   ALKPHOS 40 01/10/2017 1115   ALKPHOS 66 06/21/2016 0918   AST 20 12/04/2023 0949   AST 17 06/21/2016 0918   ALT 8 12/04/2023 0949   ALT 19 01/10/2017 1115   ALT 37 06/21/2016 0918   BILITOT 0.4 12/04/2023 0949   BILITOT 0.32 06/21/2016 0918       Impression and Plan: Emily Greer is a very pleasant 73 yo African American female with recurrent lambda light chain myeloma.  She had her second stem cell transplant for light chain myeloma was in June 2016.   She then underwent a CAR-T infusion about 21/2years ago.  Again she has done incredibly well with this.  I believe that she is still in remission.  Still think she has an remission.  I was hoping that her hemoglobin is doing better.  Thankfully, she does not need any Retacrit  today.  Her iron levels are okay.  I think we can now get her through the Holiday season.  I know that she will be incredibly busy.  We will see about making referral to Dr. Cleotilde of Gynecology to have her follow-up with Emily Greer.  As always, it is fun  talking to Emily Greer.  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   Maude JONELLE Crease, MD 11/8/20256:34 AM

## 2023-12-11 ENCOUNTER — Ambulatory Visit
Admission: RE | Admit: 2023-12-11 | Discharge: 2023-12-11 | Disposition: A | Discharge status: Home / Self Care | Source: Ambulatory Visit | Attending: Internal Medicine | Admitting: Internal Medicine

## 2023-12-11 DIAGNOSIS — Z1231 Encounter for screening mammogram for malignant neoplasm of breast: Secondary | ICD-10-CM

## 2023-12-12 DIAGNOSIS — M87180 Osteonecrosis due to drugs, jaw: Secondary | ICD-10-CM | POA: Diagnosis not present

## 2023-12-12 DIAGNOSIS — Z9481 Bone marrow transplant status: Secondary | ICD-10-CM | POA: Diagnosis not present

## 2023-12-18 ENCOUNTER — Encounter: Payer: Self-pay | Admitting: Allergy & Immunology

## 2023-12-18 ENCOUNTER — Ambulatory Visit: Admitting: Allergy & Immunology

## 2023-12-18 DIAGNOSIS — J302 Other seasonal allergic rhinitis: Secondary | ICD-10-CM

## 2023-12-18 DIAGNOSIS — J3089 Other allergic rhinitis: Secondary | ICD-10-CM | POA: Diagnosis not present

## 2023-12-18 MED ORDER — CARBINOXAMINE MALEATE 4 MG PO TABS: 4.0000 mg | ORAL_TABLET | Freq: Every day | ORAL | 1 refills | Status: AC

## 2023-12-18 NOTE — Progress Notes (Signed)
 FOLLOW UP  Date of Service/Encounter:  12/18/23   Assessment:   Seasonal and perennial allergic rhinitis (grasses, weeds, trees, indoor molds, outdoor molds, dust mites, cat, and cockroach)  Multiple myeloma in remission   Singulair  non-responder   Plan/Recommendations:   1. Chronic rhinitis - Testing today showed: grasses, weeds, trees, indoor molds, outdoor molds, dust mites, cat, and cockroach - Copy of test results provided.  - CONTINUE WITH the Flonase  (fluticasone ) one spray per nostril twice daily (aim for the ears). - CONTINUE WITH the Astelin  (azelastine ) one spray per nostril twice daily (aim for the ears). - Start taking: Carbinoxamine 4mg  tablet AT NIGHT to help with drying out your nose before bed time.  - You can use an extra dose of the antihistamine, if needed, for breakthrough symptoms.  - Consider nasal saline rinses 1-2 times daily to remove allergens from the nasal cavities as well as help with mucous clearance (this is especially helpful to do before the nasal sprays are given) - Consider allergy shots as a means of long-term control. - Allergy shots re-train and reset the immune system to ignore environmental allergens and decrease the resulting immune response to those allergens (sneezing, itchy watery eyes, runny nose, nasal congestion, etc).    - Allergy shots improve symptoms in 75-85% of patients.  - We can discuss more at the next appointment if the medications are not working for you.  2. Multiple myeloma in remission - I will send my note to Dr. Timmy to keep him in the loop.  3. Return in about 6 weeks (around 01/29/2024). You can have the follow up appointment with Dr. Iva or a Nurse Practicioner (our Nurse Practitioners are excellent and always have Physician oversight!).   Subjective:   Emily Greer is a 73 y.o. female presenting today for follow up of  Chief Complaint  Patient presents with   Allergy Testing    1-55     Emily Greer has a history of the following: Patient Active Problem List   Diagnosis Date Noted   Nasal congestion 11/07/2023   Seasonal allergic rhinitis 11/07/2023   Coronary artery calcification of native artery 11/04/2023   Obesity with body mass index 30 or greater 04/25/2023   Stem cells transplant status (HCC) 03/12/2023   Memory changes 03/06/2023   Vitamin B12 deficiency 03/06/2023   Allergic rhinitis with postnasal drip 03/06/2023   Imbalance 11/12/2022   Hives 09/27/2022   Chemotherapy-induced neutropenia 08/29/2022   Urinary frequency 03/03/2022   Mastalgia in female 12/29/2020   Class 1 obesity due to excess calories with serious comorbidity and body mass index (BMI) of 30.0 to 30.9 in adult 12/29/2020   Goals of care, counseling/discussion 08/21/2019   Osteonecrosis, unspecified (HCC) 03/31/2019   VAIN I (vaginal intraepithelial neoplasia grade I) 10/10/2018   Influenza B 02/18/2018   Flu-like symptoms 02/18/2018   Excessive cerumen in both ear canals 02/18/2018   Abnormal Pap smear of vagina 12/30/2015   Atypical squamous cells of undetermined significance (ASC-US ) on cervical Pap smear 04/16/2015   Cervical high risk HPV (human papillomavirus) test positive 04/16/2015   Iron deficiency anemia due to chronic blood loss    HCAP (healthcare-associated pneumonia) 12/29/2014   Lower urinary tract infectious disease 12/29/2014   Hypokalemia 12/29/2014   CAP (community acquired pneumonia) 12/29/2014   Bone marrow transplant status (HCC) 07/25/2014   S/P autologous bone marrow transplantation (HCC) 07/25/2014   Fever 05/31/2014   ARF (acute renal failure) 05/31/2014  Other pancytopenia (HCC) 05/31/2014   Renal failure    Examination of participant in clinical trial 05/19/2014   Encounter for examination for normal comparison or control in clinical research program 05/19/2014   Kahler disease (HCC) 05/05/2014   Multiple myeloma in remission (HCC) 05/05/2014    History of radiation therapy    Endometrial carcinoma (HCC) 09/28/2011   Pregnancy induced hypertension    Elevated hemoglobin A1c    Myeloma (HCC) 01/17/2011   Lambda light chain myeloma (HCC) 11/11/2007   Vaginal atrophy 12/19/2006   Increased BMI 12/18/2005   Hypertension 11/28/1997   H/O multiple myeloma 11/28/1997   Staphylococcus aureus bacteremia 11/28/1997   Staphylococcus epidermidis bacteremia 11/28/1997   Essential (primary) hypertension 11/28/1997    History obtained from: chart review and patient.  Discussed the use of AI scribe software for clinical note transcription with the patient and/or guardian, who gave verbal consent to proceed.  Emily Greer is a 73 y.o. female presenting for skin testing. She was last seen on October 30th. We could not do testing because her insurance company does not cover testing on the same day as a New Patient visit. She has been off of all antihistamines 3 days in anticipation of the testing.   At the last visit, we decided to do environmental allergy testing due to her history of chronic rhinitis.  We stopped the Singulair  since it did not seem to be helping.  We restarted the Flonase  1 spray per nostril twice daily and started Astelin  1 spray per nostril twice daily.   She is currently undergoing intradermal testing, which involves injecting allergens into the skin of her arm. This is a more sensitive test following the initial negative results from other allergy tests.  She has been off her antihistamines as required for the testing process.  Otherwise, there have been no changes to her past medical history, surgical history, family history, or social history.    Review of systems otherwise negative other than that mentioned in the HPI.    Objective:   There were no vitals taken for this visit. There is no height or weight on file to calculate BMI.    Physical exam deferred since this was a skin testing appointment only.    Diagnostic studies:   Allergy Studies:     Airborne Adult Perc - 12/18/23 1026     Time Antigen Placed 1026    Allergen Manufacturer Jestine    Location Back    Number of Test 55    1. Control-Buffer 50% Glycerol Negative    2. Control-Histamine 2+    3. Bahia Negative    4. Bermuda Negative    5. Johnson Negative    6. [place]  Blue Negative    7. Meadow Fescue Negative    8. Perennial Rye Negative    9. Timothy Negative    10. Ragweed Mix Negative    11. Cocklebur Negative    12. Plantain,  English Negative    13. Baccharis Negative    14. Dog Fennel Negative    15. Russian Thistle Negative    16. Lamb's Quarters Negative    17. Sheep Sorrell Negative    18. Rough Pigweed Negative    19. Marsh Elder, Rough Negative    20. Mugwort, Common Negative    21. Box, Elder Negative    22. Cedar, red Negative    23. Sweet Gum Negative    24. Pecan Pollen Negative    25. Pine Mix Negative  26. Walnut, Black Pollen Negative    27. Red Mulberry Negative    28. Ash Mix Negative    29. Birch Mix Negative    30. Beech American Negative    31. Cottonwood, Eastern Negative    32. Hickory, White Negative    33. Maple Mix Negative    34. Oak, Eastern Mix Negative    35. Sycamore Eastern Negative    36. Alternaria Alternata Negative    37. Cladosporium Herbarum Negative    38. Aspergillus Mix Negative    39. Penicillium Mix Negative    40. Bipolaris Sorokiniana (Helminthosporium) Negative    41. Drechslera Spicifera (Curvularia) Negative    42. Mucor Plumbeus Negative    43. Fusarium Moniliforme Negative    44. Aureobasidium Pullulans (pullulara) Negative    45. Rhizopus Oryzae Negative    46. Botrytis Cinera Negative    47. Epicoccum Nigrum Negative    48. Phoma Betae Negative    49. Dust Mite Mix Negative    50. Cat Hair 10,000 BAU/ml Negative    51.  Dog Epithelia Negative    52. Mixed Feathers Negative    53. Horse Epithelia Negative    54. Cockroach, German  Negative    55. Tobacco Leaf Negative          Intradermal - 12/18/23 1040     Time Antigen Placed 1045    Allergen Manufacturer Greer    Location Arm    Number of Test 16    Control Negative    Bahia 2+    Bermuda Negative    Johnson Negative    7 Grass 1+    Ragweed Mix Negative    Weed Mix 2+    Tree Mix 2+    Mold 1 2+    Mold 2 2+    Mold 3 2+    Mold 4 2+    Mite Mix 2+    Cat 2+    Dog Negative    Cockroach 2+          Allergy testing results were read and interpreted by myself, documented by clinical staff.      Marty Shaggy, MD  [facility] 

## 2023-12-18 NOTE — Patient Instructions (Addendum)
 1. Chronic rhinitis - Testing today showed: grasses, weeds, trees, indoor molds, outdoor molds, dust mites, cat, and cockroach - Copy of test results provided.  - CONTINUE WITH the Flonase  (fluticasone ) one spray per nostril twice daily (aim for the ears). - CONTINUE WITH the Astelin  (azelastine ) one spray per nostril twice daily (aim for the ears). - Start taking: Carbinoxamine 4mg  tablet AT NIGHT to help with drying out your nose before bed time.  - You can use an extra dose of the antihistamine, if needed, for breakthrough symptoms.  - Consider nasal saline rinses 1-2 times daily to remove allergens from the nasal cavities as well as help with mucous clearance (this is especially helpful to do before the nasal sprays are given) - Consider allergy shots as a means of long-term control. - Allergy shots re-train and reset the immune system to ignore environmental allergens and decrease the resulting immune response to those allergens (sneezing, itchy watery eyes, runny nose, nasal congestion, etc).    - Allergy shots improve symptoms in 75-85% of patients.  - We can discuss more at the next appointment if the medications are not working for you.  2. Multiple myeloma in remission - I will send my note to Dr. Timmy to keep him in the loop.  3. Return in about 6 weeks (around 01/29/2024). You can have the follow up appointment with Dr. Iva or a Nurse Practicioner (our Nurse Practitioners are excellent and always have Physician oversight!).    Please inform us  of any Emergency Department visits, hospitalizations, or changes in symptoms. Call us  before going to the ED for breathing or allergy symptoms since we might be able to fit you in for a sick visit. Feel free to contact us  anytime with any questions, problems, or concerns.  It was a pleasure to meet you today!  Websites that have reliable patient information: 1. American Academy of Asthma, Allergy, and Immunology:  www.aaaai.org 2. Food Allergy Research and Education (FARE): foodallergy.org 3. Mothers of Asthmatics: http://www.asthmacommunitynetwork.org 4. American College of Allergy, Asthma, and Immunology: www.acaai.org      "Like" us  on Facebook and Instagram for our latest updates!      A healthy democracy works best when [employer] participate! Make sure you are registered to vote! If you have moved or changed any of your contact information, you will need to get this updated before voting! Scan the QR codes below to learn more!        Airborne Adult Perc - 12/18/23 1026     Time Antigen Placed 1026    Allergen Manufacturer Jestine    Location Back    Number of Test 55    1. Control-Buffer 50% Glycerol Negative    2. Control-Histamine 2+    3. Bahia Negative    4. Bermuda Negative    5. Johnson Negative    6. [place]  Blue Negative    7. Meadow Fescue Negative    8. Perennial Rye Negative    9. Timothy Negative    10. Ragweed Mix Negative    11. Cocklebur Negative    12. Plantain,  English Negative    13. Baccharis Negative    14. Dog Fennel Negative    15. Russian Thistle Negative    16. Lamb's Quarters Negative    17. Sheep Sorrell Negative    18. Rough Pigweed Negative    19. Marsh Elder, Rough Negative    20. Mugwort, Common Negative    21. Box, Elder Negative  22. Cedar, red Negative    23. Sweet Gum Negative    24. Pecan Pollen Negative    25. Pine Mix Negative    26. Walnut, Black Pollen Negative    27. Red Mulberry Negative    28. Ash Mix Negative    29. Birch Mix Negative    30. Beech American Negative    31. Cottonwood, Eastern Negative    32. Hickory, White Negative    33. Maple Mix Negative    34. Oak, Eastern Mix Negative    35. Sycamore Eastern Negative    36. Alternaria Alternata Negative    37. Cladosporium Herbarum Negative    38. Aspergillus Mix Negative    39. Penicillium Mix Negative    40. Bipolaris Sorokiniana (Helminthosporium) Negative     41. Drechslera Spicifera (Curvularia) Negative    42. Mucor Plumbeus Negative    43. Fusarium Moniliforme Negative    44. Aureobasidium Pullulans (pullulara) Negative    45. Rhizopus Oryzae Negative    46. Botrytis Cinera Negative    47. Epicoccum Nigrum Negative    48. Phoma Betae Negative    49. Dust Mite Mix Negative    50. Cat Hair 10,000 BAU/ml Negative    51.  Dog Epithelia Negative    52. Mixed Feathers Negative    53. Horse Epithelia Negative    54. Cockroach, German Negative    55. Tobacco Leaf Negative          Intradermal - 12/18/23 1040     Time Antigen Placed 1045    Allergen Manufacturer Greer    Location Arm    Number of Test 16    Control Negative    Bahia 2+    Bermuda Negative    Johnson Negative    7 Grass 1+    Ragweed Mix Negative    Weed Mix 2+    Tree Mix 2+    Mold 1 2+    Mold 2 2+    Mold 3 2+    Mold 4 2+    Mite Mix 2+    Cat 2+    Dog Negative    Cockroach 2+          Reducing Pollen Exposure  The American Academy of Allergy, Asthma and Immunology suggests the following steps to reduce your exposure to pollen during allergy seasons.    Do not hang sheets or clothing out to dry; pollen may collect on these items. Do not mow lawns or spend time around freshly cut grass; mowing stirs up pollen. Keep windows closed at night.  Keep car windows closed while driving. Minimize morning activities outdoors, a time when pollen counts are usually at their highest. Stay indoors as much as possible when pollen counts or humidity is high and on windy days when pollen tends to remain in the air longer. Use air conditioning when possible.  Many air conditioners have filters that trap the pollen spores. Use a HEPA room air filter to remove pollen form the indoor air you breathe.  Control of Mold Allergen   Mold and fungi can grow on a variety of surfaces provided certain temperature and moisture conditions exist.  Outdoor molds grow on  plants, decaying vegetation and soil.  The major outdoor mold, Alternaria and Cladosporium, are found in very high numbers during hot and dry conditions.  Generally, a late Summer - Fall peak is seen for common outdoor fungal spores.  Rain will temporarily lower outdoor mold spore  count, but counts rise rapidly when the rainy period ends.  The most important indoor molds are Aspergillus and Penicillium.  Dark, humid and poorly ventilated basements are ideal sites for mold growth.  The next most common sites of mold growth are the bathroom and the kitchen.  Outdoor (Seasonal) Mold Control  Positive outdoor molds via skin testing: Alternaria, Cladosporium, Bipolaris (Helminthsporium), Drechslera (Curvalaria), and Mucor  Use air conditioning and keep windows closed Avoid exposure to decaying vegetation. Avoid leaf raking. Avoid grain handling. Consider wearing a face mask if working in moldy areas.    Indoor (Perennial) Mold Control   Positive indoor molds via skin testing: Aspergillus, Penicillium, Fusarium, Aureobasidium (Pullulara), and Rhizopus  Maintain humidity below 50%. Clean washable surfaces with 5% bleach solution. Remove sources e.g. contaminated carpets.    Control of Dog or Cat Allergen  Avoidance is the best way to manage a dog or cat allergy. If you have a dog or cat and are allergic to dog or cats, consider removing the dog or cat from the home. If you have a dog or cat but don't want to find it a new home, or if your family wants a pet even though someone in the household is allergic, here are some strategies that may help keep symptoms at bay:  Keep the pet out of your bedroom and restrict it to only a few rooms. Be advised that keeping the dog or cat in only one room will not limit the allergens to that room. Don't pet, hug or kiss the dog or cat; if you do, wash your hands with soap and water . High-efficiency particulate air (HEPA) cleaners run continuously in a  bedroom or living room can reduce allergen levels over time. Regular use of a high-efficiency vacuum cleaner or a central vacuum can reduce allergen levels. Giving your dog or cat a bath at least once a week can reduce airborne allergen.  Control of Cockroach Allergen  Cockroach allergen has been identified as an important cause of acute attacks of asthma, especially in urban settings.  There are fifty-five species of cockroach that exist in the [place] , however only three, the American, German and Oriental species produce allergen that can affect patients with Asthma.  Allergens can be obtained from fecal particles, egg casings and secretions from cockroaches.    Remove food sources. Reduce access to water . Seal access and entry points. Spray runways with 0.5-1% Diazinon or Chlorpyrifos Blow boric acid power under stoves and refrigerator. Place bait stations (hydramethylnon) at feeding sites.  Allergy Shots  Allergies are the result of a chain reaction that starts in the immune system. Your immune system controls how your body defends itself. For instance, if you have an allergy to pollen, your immune system identifies pollen as an invader or allergen. Your immune system overreacts by producing antibodies called Immunoglobulin E (IgE). These antibodies travel to cells that release chemicals, causing an allergic reaction.  The concept behind allergy immunotherapy, whether it is received in the form of shots or tablets, is that the immune system can be desensitized to specific allergens that trigger allergy symptoms. Although it requires time and patience, the payback can be long-term relief. Allergy injections contain a dilute solution of those substances that you are allergic to based upon your skin testing and allergy history.   How Do Allergy Shots Work?  Allergy shots work much like a vaccine. Your body responds to injected amounts of a particular allergen given in increasing doses,  eventually  developing a resistance and tolerance to it. Allergy shots can lead to decreased, minimal or no allergy symptoms.  There generally are two phases: build-up and maintenance. Build-up often ranges from three to six months and involves receiving injections with increasing amounts of the allergens. The shots are typically given once or twice a week, though more rapid build-up schedules are sometimes used.  The maintenance phase begins when the most effective dose is reached. This dose is different for each person, depending on how allergic you are and your response to the build-up injections. Once the maintenance dose is reached, there are longer periods between injections, typically two to four weeks.  Occasionally doctors give cortisone-type shots that can temporarily reduce allergy symptoms. These types of shots are different and should not be confused with allergy immunotherapy shots.  Who Can Be Treated with Allergy Shots?  Allergy shots may be a good treatment approach for people with allergic rhinitis (hay fever), allergic asthma, conjunctivitis (eye allergy) or stinging insect allergy.   Before deciding to begin allergy shots, you should consider:   The length of allergy season and the severity of your symptoms  Whether medications and/or changes to your environment can control your symptoms  Your desire to avoid long-term medication use  Time: allergy immunotherapy requires a major time commitment  Cost: may vary depending on your insurance coverage  Allergy shots for children age 27 and older are effective and often well tolerated. They might prevent the onset of new allergen sensitivities or the progression to asthma.  Allergy shots are not started on patients who are pregnant but can be continued on patients who become pregnant while receiving them. In some patients with other medical conditions or who take certain common medications, allergy shots may be of risk. It is  important to mention other medications you talk to your allergist.   What are the two types of build-ups offered:   RUSH or Rapid Desensitization -- one day of injections lasting from 8:30-4:30pm, injections every 1 hour.  Approximately half of the build-up process is completed in that one day.  The following week, normal build-up is resumed, and this entails ~16 visits either weekly or twice weekly, until reaching your "maintenance dose" which is continued weekly until eventually getting spaced out to every month for a duration of 3 to 5 years. The regular build-up appointments are nurse visits where the injections are administered, followed by required monitoring for 30 minutes.    Traditional build-up -- weekly visits for 6 -12 months until reaching "maintenance dose", then continue weekly until eventually spacing out to every 4 weeks as above. At these appointments, the injections are administered, followed by required monitoring for 30 minutes.     Either way is acceptable, and both are equally effective. With the rush protocol, the advantage is that less time is spent here for injections overall AND you would also reach maintenance dosing faster (which is when the clinical benefit starts to become more apparent). Not everyone is a candidate for rapid desensitization.   IF we proceed with the RUSH protocol, there are premedications which must be taken the day before and the day after the rush only (this includes antihistamines, steroids, and Singulair ).  After the rush day, no prednisone  or Singulair  is required, and we just recommend antihistamines taken on your injection day.  What Is An Estimate of the Costs?  If you are interested in starting allergy injections, please check with your insurance company about your coverage for  both allergy vial sets and allergy injections.  Please do so prior to making the appointment to start injections.  The following are CPT codes to give to your insurance  company. These are the amounts we BILL to the insurance company, but the amount YOU WILL PAY and WE RECEIVE IS SUBSTANTIALLY LESS and depends on the contracts we have with different insurance companies.   Amount Billed to Insurance One allergy vial set  CPT 95165   $ 1200     Two allergy vial set  CPT 95165   $ 2400     Three allergy vial set  CPT 95165   $ 3600     One injection   CPT 95115   $ 35  Two injections   CPT 95117   $ 40 RUSH (Rapid Desensitization) CPT 95180 x 8 hours $500/hour  Regarding the allergy injections, your co-pay may or may not apply with each injection, so please confirm this with your insurance company. When you start allergy injections, 1 or 2 sets of vials are made based on your allergies.  Not all patients can be on one set of vials. A set of vials lasts 6 months to a year depending on how quickly you can proceed with your build-up of your allergy injections. Vials are personalized for each patient depending on their specific allergens.  How often are allergy injection given during the build-up period?   Injections are given at least weekly during the build-up period until your maintenance dose is achieved. Per the doctor's discretion, you may have the option of getting allergy injections two times per week during the build-up period. However, there must be at least 48 hours between injections. The build-up period is usually completed within 6-12 months depending on your ability to schedule injections and for adjustments for reactions. When maintenance dose is reached, your injection schedule is gradually changed to every two weeks and later to every three weeks. Injections will then continue every 4 weeks. Usually, injections are continued for a total of 3-5 years.   When Will I Feel Better?  Some may experience decreased allergy symptoms during the build-up phase. For others, it may take as long as 12 months on the maintenance dose. If there is no improvement after a  year of maintenance, your allergist will discuss other treatment options with you.  If you aren't responding to allergy shots, it may be because there is not enough dose of the allergen in your vaccine or there are missing allergens that were not identified during your allergy testing. Other reasons could be that there are high levels of the allergen in your environment or major exposure to non-allergic triggers like tobacco smoke.  What Is the Length of Treatment?  Once the maintenance dose is reached, allergy shots are generally continued for three to five years. The decision to stop should be discussed with your allergist at that time. Some people may experience a permanent reduction of allergy symptoms. Others may relapse and a longer course of allergy shots can be considered.  What Are the Possible Reactions?  The two types of adverse reactions that can occur with allergy shots are local and systemic. Common local reactions include very mild redness and swelling at the injection site, which can happen immediately or several hours after. Report a delayed reaction from your last injection. These include arm swelling or runny nose, watery eyes or cough that occurs within 12-24 hours after injection. A systemic reaction, which is less common,  affects the entire body or a particular body system. They are usually mild and typically respond quickly to medications. Signs include increased allergy symptoms such as sneezing, a stuffy nose or hives.   Rarely, a serious systemic reaction called anaphylaxis can develop. Symptoms include swelling in the throat, wheezing, a feeling of tightness in the chest, nausea or dizziness. Most serious systemic reactions develop within 30 minutes of allergy shots. This is why it is strongly recommended you wait in your doctor's office for 30 minutes after your injections. Your allergist is trained to watch for reactions, and his or her staff is trained and equipped with the  proper medications to identify and treat them.   Report to the nurse immediately if you experience any of the following symptoms: swelling, itching or redness of the skin, hives, watery eyes/nose, breathing difficulty, excessive sneezing, coughing, stomach pain, diarrhea, or light headedness. These symptoms may occur within 15-20 minutes after injection and may require medication.   Who Should Administer Allergy Shots?  The preferred location for receiving shots is your prescribing allergist's office. Injections can sometimes be given at another facility where the physician and staff are trained to recognize and treat reactions, and have received instructions by your prescribing allergist.  What if I am late for an injection?   Injection dose will be adjusted depending upon how many days or weeks you are late for your injection.   What if I am sick?   Please report any illness to the nurse before receiving injections. She may adjust your dose or postpone injections depending on your symptoms. If you have fever, flu, sinus infection or chest congestion it is best to postpone allergy injections until you are better. Never get an allergy injection if your asthma is causing you problems. If your symptoms persist, seek out medical care to get your health problem under control.  What If I am or Become Pregnant:  Women that become pregnant should schedule an appointment with [facility] before receiving any further allergy injections.

## 2023-12-31 ENCOUNTER — Ambulatory Visit: Payer: Self-pay | Admitting: *Deleted

## 2023-12-31 NOTE — Telephone Encounter (Signed)
 Patient requesting if earlier appt with PCP please call back.    FYI Only or Action Required?: FYI only for provider: appointment scheduled on 01/02/24, earliest available.  Patient was last seen in primary care on 10/30/2023 by Jarold Medici, MD.  Called Nurse Triage reporting Cough.  Symptoms began several days ago Friday   Interventions attempted: OTC medications: na , Prescription medications: astelin , flonase , and Rest, hydration, or home remedies.  Symptoms are: gradually worsening.  Triage Disposition: See Physician Within 24 Hours 24 - 48 hours  Patient/caregiver understands and will follow disposition?: Yes                Copied from CRM #[id number]. Topic: Clinical - Red Word Triage >> Dec 31, 2023  1:17 PM Wess RAMAN wrote: Red Word that prompted transfer to Nurse Triage: Low grade fever, can't breathe well, cough Reason for Disposition  Fever present > 3 days (72 hours)  Answer Assessment - Initial Assessment Questions No available appt with PCP until next week. Patient requesting to only see PCP but agreeable to see other provider 01/02/24. Patient requesting call back if earlier appt can be scheduled. Recommended if sx worsen go to UC/ED. Please advise.       1. ONSET: When did the cough begin?      Friday  2. SEVERITY: How bad is the cough today?      Not better  3. SPUTUM: Describe the color of your sputum (e.g., none, dry cough; clear, white, yellow, green)     Between clear and yellow  4. HEMOPTYSIS: Are you coughing up any blood? If Yes, ask: How much? (e.g., flecks, streaks, tablespoons, etc.)     No 5. DIFFICULTY BREATHING: Are you having difficulty breathing? If Yes, ask: How bad is it? (e.g., mild, moderate, severe)      Mild  6. FEVER: Do you have a fever? If Yes, ask: What is your temperature, how was it measured, and when did it start?     100.1 this am  7. CARDIAC HISTORY: Do you have any history of heart disease?  (e.g., heart attack, congestive heart failure)      See hx  multiple myeloma  8. LUNG HISTORY: Do you have any history of lung disease?  (e.g., pulmonary embolus, asthma, emphysema)     See hx  9. PE RISK FACTORS: Do you have a history of blood clots? (or: recent major surgery, recent prolonged travel, bedridden)     na 10. OTHER SYMPTOMS: Do you have any other symptoms? (e.g., runny nose, wheezing, chest pain)       Fever , runny nose, blowing nose with some streaks of blood at times,  productive cough clear to yellow sputum. No difficulty breathing now since taking flonase  and astelin .   11. PREGNANCY: Is there any chance you are pregnant? When was your last menstrual period?       na 12. TRAVEL: Have you traveled out of the country in the last month? (e.g., travel history, exposures)       na  Protocols used: Cough - Acute Productive-A-AH

## 2024-01-01 NOTE — Telephone Encounter (Signed)
 Called and spoke with patient 12/31/2023- patient advised to go to UC due to high fever since Monday.Patient was advised she shouldn't wait until Wednesday to be seen. Patient agreed to go to UC once she got her car back.

## 2024-01-02 ENCOUNTER — Ambulatory Visit: Admitting: Family Medicine

## 2024-01-02 ENCOUNTER — Ambulatory Visit: Payer: Self-pay | Admitting: Nurse Practitioner

## 2024-01-02 ENCOUNTER — Encounter: Payer: Self-pay | Admitting: Family Medicine

## 2024-01-02 VITALS — BP 116/60 | HR 91 | Temp 98.9°F | Ht 64.0 in | Wt 176.0 lb

## 2024-01-02 DIAGNOSIS — R051 Acute cough: Secondary | ICD-10-CM

## 2024-01-02 DIAGNOSIS — C9001 Multiple myeloma in remission: Secondary | ICD-10-CM | POA: Diagnosis not present

## 2024-01-02 DIAGNOSIS — J0100 Acute maxillary sinusitis, unspecified: Secondary | ICD-10-CM

## 2024-01-02 DIAGNOSIS — C9000 Multiple myeloma not having achieved remission: Secondary | ICD-10-CM

## 2024-01-02 MED ORDER — AMOXICILLIN-POT CLAVULANATE 875-125 MG PO TABS: 1.0000 | ORAL_TABLET | Freq: Two times a day (BID) | ORAL | 0 refills | Status: AC

## 2024-01-02 MED ORDER — BENZONATATE 100 MG PO CAPS: 100.0000 mg | ORAL_CAPSULE | Freq: Three times a day (TID) | ORAL | 0 refills | Status: AC

## 2024-01-02 NOTE — Progress Notes (Signed)
 Emily Greer, CMA,acting as a neurosurgeon for [employer], NP.,have documented all relevant documentation on the behalf of Emily Creighton, NP,as directed by  Emily Creighton, NP while in the presence of Emily Creighton, NP.  Subjective:  Patient ID: Emily Greer , female    DOB: 1950-05-31 , 73 y.o.   MRN: [id number]  Chief Complaint  Patient presents with   URI    Patient presents today for cold symptoms. She reports she had has a low grade fever on Monday it was 101. Patient reports she has been having a cough, mucus and fatigued. Patient reports she has been taking robitussin and tylenol .     HPI Discussed the use of AI scribe software for clinical note transcription with the patient, who gave verbal consent to proceed.  History of Present Illness    IYARI Greer is a 73 year old female who presents with upper respiratory symptoms.  She began experiencing upper respiratory symptoms around December 18, 2023, initially presenting as nasal congestion. Allergy  testing conducted on the same day revealed allergies to almost all tested allergens. Despite using two nasal sprays and taking Xyzal , her symptoms did not improve significantly.  Her symptoms worsened around December 28, 2023, with the onset of a cough and yellow nasal discharge. The cough is more prominent at night, although her spouse notes that she coughs throughout the day. She has been using over-the-counter Robitussin, which provides temporary relief, particularly at night, allowing her to sleep better.  She experienced a low-grade fever of 101F on Monday, which was reduced with Tylenol . No wheezing or shortness of breath. Her mother notes that she was not eating or moving much three to four days ago, but her condition has improved since then.  Her past medical history includes a previous episode of nasal congestion in September and a history of allergies that were dormant for many years but have recently resurfaced. She is  currently not taking Claritin , which she was previously on before her CAR T cell treatment in [place].  She is currently using two nasal sprays and taking Xyzal  for her allergies. She has also been using Tussin expectorant for her cough.      Past Medical History:  Diagnosis Date   Avascular necrosis of femoral head (HCC)    bilateral per CT 07-26-2015   Endometrial carcinoma [facility]) gyn oncologist-  dr jerrol ([facility])/  radiation oncologist-- dr shannon   2013 dx  FIGO Stage 1A, Grade 2 endometrioid endometrial cancer s/p TAH w/ BSO and bilateral pelvic node dissection 10-31-2011 ;  recurrence at distal vagina 04/ 2014 s/p  brachytherapy (ended 07-29-2012);  2nd recurrence 12/ 2016  vaginal apex s/p  conformational radiotherapy 03-10-2015 to 04-20-2015   Family history of adverse reaction to anesthesia    mother ponv   GERD (gastroesophageal reflux disease)    Goals of care, counseling/discussion 08/21/2019   H/O stem cell transplant (HCC)    02/ 2000 and second one 06/ 2016   History of bacteremia    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   History of chemotherapy    last chemo 12-26-17   History of radiation therapy 6/4, 6/11, 6/19, 6/25, 07/29/2012   vagina 30.5 gray in 5 fx, HDR brachytherapy:   last radiation to vagina 03-10-2015 to 04-20-2015  50.4gray   History of radiation therapy 03/10/15-04/20/15   vagina 50.4 in 28 fractions   Hypertension    Lambda light chain myeloma [facility]) oncologist-  dr  ennever ([facility])  and  [facility] -- dr lela gasparetto   dx 07/ 1999 s/p  VAD chemotherapy 11/ 1999,  purged autotransplant 03-21-1998 followed by auto stem cell transplant 03-29-1998;  recurrance w/ second autologous stem cell transplant 07-24-2014;  in Re-mission currently , chemo maintenance therapy   Osteoporosis 12/18/05   Increased  risk    PONV (postoperative nausea and vomiting)    Wears glasses      Family History  Problem Relation Age  of Onset   Allergic rhinitis Mother    Colon cancer Mother    Hypertension Father    Heart Problems Father    Breast cancer Neg Hx      Current Outpatient Medications:    acyclovir  (ZOVIRAX ) 400 MG tablet, TAKE 1 TABLET(400 MG) BY MOUTH TWICE DAILY, Disp: 60 tablet, Rfl: 6   albuterol  (VENTOLIN  HFA) 108 (90 Base) MCG/ACT inhaler, Inhale 2 puffs into the lungs every 6 (six) hours as needed for wheezing or shortness of breath. 2 puffs 3 times daily x 5 days then every 6 hours as needed., Disp: 18 g, Rfl: 11   amLODipine  (NORVASC ) 10 MG tablet, TAKE 1 TABLET(10 MG) BY MOUTH EVERY MORNING, Disp: 90 tablet, Rfl: 2   amoxicillin -clavulanate (AUGMENTIN ) 875-125 MG tablet, Take 1 tablet by mouth 2 (two) times daily., Disp: 14 tablet, Rfl: 0   Aspirin 81 MG CAPS, 1 capsule daily., Disp: , Rfl:    azelastine  (ASTELIN ) 0.1 % nasal spray, Place 1 spray into both nostrils 2 (two) times daily. Use in each nostril as directed, Disp: 30 mL, Rfl: 5   benzonatate  (TESSALON ) 100 MG capsule, Take 1 capsule (100 mg total) by mouth 3 (three) times daily., Disp: 30 capsule, Rfl: 0   calcium-vitamin D  (OSCAL WITH D) 500-5 MG-MCG tablet, Take 1 tablet by mouth daily., Disp: , Rfl:    Carbinoxamine  Maleate 4 MG TABS, Take 1 tablet (4 mg total) by mouth at bedtime., Disp: 90 tablet, Rfl: 1   chlorhexidine (PERIDEX) 0.12 % solution, 10 mLs 2 (two) times daily., Disp: , Rfl:    Cholecalciferol  (VITAMIN D3) 2000 UNITS TABS, Take 2 tablets by mouth daily., Disp: , Rfl:    fluticasone  (FLONASE ) 50 MCG/ACT nasal spray, Place 1 spray into both nostrils in the morning and at bedtime., Disp: 16 g, Rfl: 5   letrozole  (FEMARA ) 2.5 MG tablet, Take 1 tablet (2.5 mg total) by mouth daily., Disp: 30 tablet, Rfl: 12   levocetirizine (XYZAL  ALLERGY  24HR) 5 MG tablet, Take 1 tablet (5 mg total) by mouth every evening., Disp: 30 tablet, Rfl: 3   metoprolol  tartrate (LOPRESSOR ) 50 MG tablet, Take 1/2 tablet by mouth twice daily, Disp: 90  tablet, Rfl: 1   potassium chloride  SA (KLOR-CON  M) 20 MEQ tablet, TAKE 1 TABLET(20 MEQ) BY MOUTH TWICE DAILY, Disp: 60 tablet, Rfl: 3   Safety Seal Miscellaneous MISC, Apply 1 Application topically in the morning. Medication Name: Hormonic Hair Solution (Mioxidil 7%, Finasteride 0.05%), Disp: 30 mL, Rfl: 6   montelukast  (SINGULAIR ) 10 MG tablet, TAKE 1 TABLET BY MOUTH EVERY DAY (Patient not taking: Reported on 01/02/2024), Disp: 90 tablet, Rfl: 2 No current facility-administered medications for this visit.  Facility-Administered Medications Ordered in Other Visits:    0.9 %  sodium chloride  infusion, , Intravenous, Once PRN, Ennever, Peter R, MD   albuterol  (PROVENTIL ) (2.5 MG/3ML) 0.083% nebulizer solution 2.5 mg, 2.5 mg, Nebulization, Once PRN, Ennever, Peter R, MD   Allergies  Allergen Reactions  Codeine Nausea Only     Review of Systems  Constitutional:  Positive for fever. Negative for chills.  HENT:  Positive for congestion, sinus pressure and sinus pain.   Respiratory:  Positive for cough.   Cardiovascular: Negative.   Gastrointestinal: Negative.   Psychiatric/Behavioral: Negative.       Today's Vitals   01/02/24 1018  BP: 116/60  Pulse: 91  Temp: 98.9 F (37.2 C)  TempSrc: Oral  Weight: 176 lb (79.8 kg)  Height: 5' 4 (1.626 m)  PainSc: 0-No pain   Body mass index is 30.21 kg/m.  Wt Readings from Last 3 Encounters:  01/02/24 176 lb (79.8 kg)  12/04/23 176 lb 12.8 oz (80.2 kg)  11/29/23 176 lb 8 oz (80.1 kg)    The 10-year ASCVD risk score (Arnett DK, et al., 2019) is: 7.8%   Values used to calculate the score:     Age: 56 years     Clincally relevant sex: Female     Is Non-Hispanic African American: Yes     Diabetic: No     Tobacco smoker: No     Systolic Blood Pressure: 116 mmHg     Is BP treated: Yes     HDL Cholesterol: 76 mg/dL     Total Cholesterol: 134 mg/dL  Objective:  Physical Exam Constitutional:      Appearance: Normal appearance.  HENT:      Head: Normocephalic.     Nose:     Right Sinus: Maxillary sinus tenderness present.     Left Sinus: Maxillary sinus tenderness present.  Cardiovascular:     Rate and Rhythm: Normal rate and regular rhythm.     Pulses: Normal pulses.     Heart sounds: Normal heart sounds.  Pulmonary:     Effort: Pulmonary effort is normal.     Breath sounds: Normal breath sounds.  Abdominal:     General: Bowel sounds are normal.  Neurological:     Mental Status: She is alert and oriented to person, place, and time.         Assessment And Plan:   Assessment & Plan Acute cough Use Tessalon  Perles as needed Subacute maxillary sinusitis Start Augmentin  875/125 BID x 7 days Multiple myeloma in remission (HCC) Chronic. Continues to see oncology. Treatment plan includes Retacrit  and Ferumoxytol  q7 days along with IVIG.  Name Type Plan Dates Plan Provider   RETACRIT CORINE FLUSH Q6WK  & FERUMOXYTOL  (FERAHEME ) Q7D & IVIG - Low Dose           Return if symptoms worsen or fail to improve.  Patient was given opportunity to ask questions. Patient verbalized understanding of the plan and was able to repeat key elements of the plan. All questions were answered to their satisfaction.    I, Emily Creighton, NP, have reviewed all documentation for this visit. The documentation on 01/09/2024 for the exam, diagnosis, procedures, and orders are all accurate and complete.   IF YOU HAVE BEEN REFERRED TO A SPECIALIST, IT MAY TAKE 1-2 WEEKS TO SCHEDULE/PROCESS THE REFERRAL. IF YOU HAVE NOT HEARD FROM US /SPECIALIST IN TWO WEEKS, PLEASE GIVE US  A CALL AT [phone].

## 2024-01-04 ENCOUNTER — Ambulatory Visit
Admission: RE | Admit: 2024-01-04 | Discharge: 2024-01-04 | Disposition: A | Source: Ambulatory Visit | Attending: Family Medicine | Admitting: Family Medicine

## 2024-01-04 ENCOUNTER — Other Ambulatory Visit: Payer: Self-pay | Admitting: Family Medicine

## 2024-01-04 ENCOUNTER — Telehealth: Payer: Self-pay

## 2024-01-04 DIAGNOSIS — R059 Cough, unspecified: Secondary | ICD-10-CM

## 2024-01-04 NOTE — Telephone Encounter (Signed)
 I called patient to see how she is feeling. She stated she is still coughing a lot. Patient was advised that provider put in an order for her to have an xray done and she should go have it done today. Patient stated she will go today. Address to [facility] was provided to patient. YL,RMA

## 2024-01-09 NOTE — Assessment & Plan Note (Signed)
 Chronic. Continues to see oncology. Treatment plan includes Retacrit  and Ferumoxytol  q7 days along with IVIG.  Name Type Plan Dates Plan Provider   RETACRIT /PORTACATH FLUSH Q6WK  & FERUMOXYTOL  (FERAHEME ) Q7D & IVIG - Low Dose

## 2024-01-10 DIAGNOSIS — R059 Cough, unspecified: Secondary | ICD-10-CM | POA: Diagnosis not present

## 2024-01-16 ENCOUNTER — Inpatient Hospital Stay: Attending: Hematology & Oncology

## 2024-01-16 DIAGNOSIS — C9000 Multiple myeloma not having achieved remission: Secondary | ICD-10-CM | POA: Insufficient documentation

## 2024-01-16 DIAGNOSIS — C541 Malignant neoplasm of endometrium: Secondary | ICD-10-CM | POA: Diagnosis present

## 2024-01-16 DIAGNOSIS — Z79899 Other long term (current) drug therapy: Secondary | ICD-10-CM | POA: Insufficient documentation

## 2024-01-16 NOTE — Patient Instructions (Signed)

## 2024-01-18 ENCOUNTER — Other Ambulatory Visit (HOSPITAL_COMMUNITY)
Admission: RE | Admit: 2024-01-18 | Discharge: 2024-01-18 | Disposition: A | Source: Ambulatory Visit | Attending: Obstetrics & Gynecology | Admitting: Obstetrics & Gynecology

## 2024-01-18 ENCOUNTER — Ambulatory Visit: Payer: Self-pay

## 2024-01-18 ENCOUNTER — Encounter (HOSPITAL_BASED_OUTPATIENT_CLINIC_OR_DEPARTMENT_OTHER): Payer: Self-pay | Admitting: Obstetrics & Gynecology

## 2024-01-18 ENCOUNTER — Ambulatory Visit (INDEPENDENT_AMBULATORY_CARE_PROVIDER_SITE_OTHER): Admitting: Obstetrics & Gynecology

## 2024-01-18 VITALS — BP 121/78 | HR 70 | Ht 64.0 in | Wt 174.0 lb

## 2024-01-18 DIAGNOSIS — N890 Mild vaginal dysplasia: Secondary | ICD-10-CM | POA: Diagnosis not present

## 2024-01-18 DIAGNOSIS — Z87411 Personal history of vaginal dysplasia: Secondary | ICD-10-CM

## 2024-01-18 DIAGNOSIS — Z124 Encounter for screening for malignant neoplasm of cervix: Secondary | ICD-10-CM | POA: Insufficient documentation

## 2024-01-18 DIAGNOSIS — R87629 Unspecified abnormal cytological findings in specimens from vagina: Secondary | ICD-10-CM

## 2024-01-18 DIAGNOSIS — C541 Malignant neoplasm of endometrium: Secondary | ICD-10-CM

## 2024-01-18 DIAGNOSIS — Z01419 Encounter for gynecological examination (general) (routine) without abnormal findings: Secondary | ICD-10-CM | POA: Diagnosis present

## 2024-01-18 DIAGNOSIS — C9001 Multiple myeloma in remission: Secondary | ICD-10-CM

## 2024-01-18 DIAGNOSIS — R87810 Cervical high risk human papillomavirus (HPV) DNA test positive: Secondary | ICD-10-CM

## 2024-01-18 DIAGNOSIS — Z1151 Encounter for screening for human papillomavirus (HPV): Secondary | ICD-10-CM | POA: Diagnosis not present

## 2024-01-18 NOTE — Progress Notes (Signed)
 "  ANNUAL EXAM Patient name: Emily Greer MRN [id number]  Date of birth: Oct 11, 1950 Chief Complaint:   Gynecologic Exam  History of Present Illness:   Emily Greer is a 73 y.o. [id number] African-American female being seen today for new patient exam.  H/o endometrioid endometrial cancer with recurrence in December 2016 (at vaginal apex).  First recurrence treated with brachytherapy.  Second with pelvic radiation.  Treatment completed 03/2015.  On letrazole.  She does have hx of vaginal dysplasia with +HR HPV as well.  She does have shorted vagina with significant radiation changes per note from Dr. Viktoria in 2023.    Denies vaginal bleeding.  H/o diarrhea but uses imodium  so having more constipation issues.  Denies urinary issues.    Also h/o mulitple myeloma followed by Dr. Timmy.  Had PET scan 07/23/2023 with no worrisome pelvic findings as well.  No LMP recorded. Patient has had a hysterectomy.   Last pap 2023. Results were: ASCUS with neg HR HPV.  Last mammogram: 12/13/2023. Results were: normal. Family h/o breast cancer: no Last colonoscopy: 05/2023. Results were: normal. Family h/o colorectal cancer: no DEXA:  04/11/2022.  Normal.       01/18/2024    8:54 AM 01/16/2024   10:20 AM 12/04/2023   10:14 AM 11/07/2023    9:53 AM 10/30/2023    2:48 PM  Depression screen PHQ 2/9  Decreased Interest 0 0 0 0 0  Down, Depressed, Hopeless 0  0 0 0  PHQ - 2 Score 0 0 0 0 0  Altered sleeping    0 0  Tired, decreased energy    0 0  Change in appetite    0 0  Feeling bad or failure about yourself     0 0  Trouble concentrating    0 0  Moving slowly or fidgety/restless    0 0  Suicidal thoughts    0 0  PHQ-9 Score    0  0   Difficult doing work/chores    Not difficult at all Not difficult at all     Data saved with a previous flowsheet row definition        03/05/2023   11:57 AM 07/04/2022   11:46 AM  GAD 7 : Generalized Anxiety Score  Nervous, Anxious, on Edge 0 0  Control/stop  worrying 0 0  Worry too much - different things 0 0  Trouble relaxing 0 0  Restless 0 0  Easily annoyed or irritable 0 0  Afraid - awful might happen 0 0  Total GAD 7 Score 0 0  Anxiety Difficulty Not difficult at all Not difficult at all     Review of Systems:   Pertinent items are noted in HPI Denies any pelvic pain.  Urinary changes.  Bowel changes noted above.   Pertinent History Reviewed:  Reviewed past medical,surgical, social and family history.  Reviewed problem list, medications and allergies. Physical Assessment:   Vitals:   01/18/24 0816  BP: 121/78  Pulse: 70  SpO2: 99%  Weight: 174 lb (78.9 kg)  Height: 5' 4 (1.626 m)  Body mass index is 29.87 kg/m.        Physical Examination:   General appearance - well appearing, and in no distress  Mental status - alert, oriented to person, place, and time  Psych:  She has a normal mood and affect  Skin - warm and dry, normal color, no suspicious lesions noted  Chest - effort normal, all lung  fields clear to auscultation bilaterally  Heart - normal rate and regular rhythm  Neck:  midline trachea, no thyromegaly or nodules  Breasts - breasts appear normal, no suspicious masses, no skin or nipple changes or  axillary nodes  Abdomen - soft, nontender, nondistended, no masses or organomegaly  Pelvic - VULVA: normal appearing vulva with no masses, tenderness or lesions   VAGINA: shorted, thickened tissue d/w radiation changes, can insert small speculum about 3cm.  Thickening across the entire apex noted.  Pap obtained.   CERVIX: surgically absent  Thin prep pap is obtained with HR HPV cotesting  UTERUS: surgically absent  ADNEXA: No adnexal masses or tenderness noted.  Rectal - normal rectal, good sphincter tone, no masses felt. No nodularity on BME.  Extremities:  No swelling or varicosities noted  Chaperone present for exam  No results found. However, due to the size of the patient record, not all encounters were  searched. Please check Results Review for a complete set of results.  Assessment & Plan:  1. Endometrial carcinoma (HCC) (Primary) - 2013, FIGO Stage 1A, grade 2 s/p TAH/BSO/bilateral pelvic node dissection 10-31-2011 - Recurrence at distal vagina 04/2012 s/p brachytherapy (ended 07-29-2012) - Second recurrence 12/ 2016 vaginal apex s/p radiotherapy 03/10/2015 to 04/20/2015   2. History of vaginal dysplasia - Cytology - PAP( [place])  3. VAIN I (vaginal intraepithelial neoplasia grade I)  4. Multiple myeloma in remission (HCC) - followed by Dr. Timmy  5. Abnormal Pap smear of vagina  6. Cervical high risk HPV (human papillomavirus) test positive   No orders of the defined types were placed in this encounter.   Meds: No orders of the defined types were placed in this encounter.   Follow-up: Return in about 1 year (around 01/17/2025).  Ronal GORMAN Pinal, MD 01/18/2024 9:03 AM   "

## 2024-01-22 ENCOUNTER — Ambulatory Visit

## 2024-01-22 VITALS — BP 130/60 | HR 85 | Temp 99.0°F | Ht 64.0 in | Wt 174.0 lb

## 2024-01-22 DIAGNOSIS — Z23 Encounter for immunization: Secondary | ICD-10-CM | POA: Diagnosis not present

## 2024-01-22 NOTE — Progress Notes (Addendum)
 Patient is in office today for a nurse visit for flu Immunization. Patient Injection was given in the  Left deltoid. Patient tolerated injection well.

## 2024-01-23 LAB — CYTOLOGY - PAP
Comment: NEGATIVE
Diagnosis: NEGATIVE
High risk HPV: NEGATIVE

## 2024-01-30 ENCOUNTER — Ambulatory Visit: Payer: Self-pay | Admitting: Family Medicine

## 2024-01-30 NOTE — Progress Notes (Signed)
 Clear ; no pneumonia

## 2024-02-02 ENCOUNTER — Ambulatory Visit (HOSPITAL_BASED_OUTPATIENT_CLINIC_OR_DEPARTMENT_OTHER): Payer: Self-pay | Admitting: Obstetrics & Gynecology

## 2024-02-05 ENCOUNTER — Ambulatory Visit: Admitting: Allergy & Immunology

## 2024-02-05 ENCOUNTER — Other Ambulatory Visit: Payer: Self-pay

## 2024-02-05 VITALS — BP 126/74 | HR 87 | Temp 97.9°F | Resp 18 | Ht 63.0 in | Wt 176.3 lb

## 2024-02-05 DIAGNOSIS — J3089 Other allergic rhinitis: Secondary | ICD-10-CM | POA: Diagnosis not present

## 2024-02-05 DIAGNOSIS — C9001 Multiple myeloma in remission: Secondary | ICD-10-CM | POA: Diagnosis not present

## 2024-02-05 DIAGNOSIS — J302 Other seasonal allergic rhinitis: Secondary | ICD-10-CM

## 2024-02-05 NOTE — Progress Notes (Unsigned)
 "  FOLLOW UP  Date of Service/Encounter:  02/05/2024   Assessment:   Seasonal and perennial allergic rhinitis (grasses, weeds, trees, indoor molds, outdoor molds, dust mites, cat, and cockroach)   Multiple myeloma in remission   Singulair  non-responder   Plan/Recommendations:   There are no Patient Instructions on file for this visit.   Subjective:   LAI HENDRIKS is a 74 y.o. female presenting today for follow up of  Chief Complaint  Patient presents with   Follow-up    No complaints     DENITA LUN has a history of the following: Patient Active Problem List   Diagnosis Date Noted   Nasal congestion 11/07/2023   Seasonal allergic rhinitis 11/07/2023   Coronary artery calcification of native artery 11/04/2023   Obesity with body mass index 30 or greater 04/25/2023   Stem cells transplant status (HCC) 03/12/2023   Memory changes 03/06/2023   Vitamin B12 deficiency 03/06/2023   Allergic rhinitis with postnasal drip 03/06/2023   Imbalance 11/12/2022   Hives 09/27/2022   Chemotherapy-induced neutropenia 08/29/2022   Mastalgia in female 12/29/2020   Class 1 obesity due to excess calories with serious comorbidity and body mass index (BMI) of 30.0 to 30.9 in adult 12/29/2020   Goals of care, counseling/discussion 08/21/2019   Osteonecrosis, unspecified (HCC) 03/31/2019   VAIN I (vaginal intraepithelial neoplasia grade I) 10/10/2018   Influenza B 02/18/2018   Flu-like symptoms 02/18/2018   Excessive cerumen in both ear canals 02/18/2018   Abnormal Pap smear of vagina 12/30/2015   Atypical squamous cells of undetermined significance (ASC-US ) on cervical Pap smear 04/16/2015   Cervical high risk HPV (human papillomavirus) test positive 04/16/2015   Iron deficiency anemia due to chronic blood loss    HCAP (healthcare-associated pneumonia) 12/29/2014   Lower urinary tract infectious disease 12/29/2014   Hypokalemia 12/29/2014   CAP (community acquired pneumonia)  12/29/2014   Bone marrow transplant status (HCC) 07/25/2014   S/P autologous bone marrow transplantation (HCC) 07/25/2014   Fever 05/31/2014   ARF (acute renal failure) 05/31/2014   Other pancytopenia (HCC) 05/31/2014   Renal failure    Examination of participant in clinical trial 05/19/2014   Encounter for examination for normal comparison or control in clinical research program 05/19/2014   Kahler disease (HCC) 05/05/2014   Multiple myeloma in remission (HCC) 05/05/2014   History of radiation therapy    Endometrial carcinoma (HCC) 09/28/2011   Pregnancy induced hypertension    Elevated hemoglobin A1c    Myeloma (HCC) 01/17/2011   Lambda light chain myeloma (HCC) 11/11/2007   Vaginal atrophy 12/19/2006   Increased BMI 12/18/2005   Hypertension 11/28/1997   H/O multiple myeloma 11/28/1997   Staphylococcus aureus bacteremia 11/28/1997   Staphylococcus epidermidis bacteremia 11/28/1997   Essential (primary) hypertension 11/28/1997    History obtained from: chart review and {Persons; PED relatives w/patient:19415::patient}.  Discussed the use of AI scribe software for clinical note transcription with the patient and/or guardian, who gave verbal consent to proceed.  Peytin is a 74 y.o. female presenting for {Blank single:19197::a food challenge,a drug challenge,skin testing,a sick visit,an evaluation of ***,a follow up visit}.  We last saw her in November 2025.  At that time, she had testing that was positive for multiple indoor and outdoor allergens.  We continue with Flonase  as well as Astelin  and started carbinoxamine  4 mg at night to help with drying out her sinuses.  Since last visit,  Asthma/Respiratory Symptom History: ***  Allergic Rhinitis Symptom History: ***  Food Allergy  Symptom History: ***  Skin Symptom History: ***  GERD Symptom History: ***  Infection Symptom History: ***  Otherwise, there have been no changes to her past medical history,  surgical history, family history, or social history.    Review of systems otherwise negative other than that mentioned in the HPI.    Objective:   Blood pressure 126/74, pulse 87, temperature 97.9 F (36.6 C), temperature source Temporal, resp. rate 18, height 5' 3 (1.6 m), weight 176 lb 4.8 oz (80 kg), SpO2 98%. Body mass index is 31.23 kg/m.    Physical Exam   Diagnostic studies: {Blank single:19197::none,deferred due to recent antihistamine use,deferred due to insurance stipulations that require a separate visit for testing,labs sent instead, }  Spirometry: {Blank single:19197::results normal (FEV1: ***%, FVC: ***%, FEV1/FVC: ***%),results abnormal (FEV1: ***%, FVC: ***%, FEV1/FVC: ***%)}.    {Blank single:19197::Spirometry consistent with mild obstructive disease,Spirometry consistent with moderate obstructive disease,Spirometry consistent with severe obstructive disease,Spirometry consistent with possible restrictive disease,Spirometry consistent with mixed obstructive and restrictive disease,Spirometry uninterpretable due to technique,Spirometry consistent with normal pattern}. {Blank single:19197::Albuterol /Atrovent nebulizer,Xopenex /Atrovent nebulizer,Albuterol  nebulizer,Albuterol  four puffs via MDI,Xopenex  four puffs via MDI} treatment given in clinic with {Blank single:19197::significant improvement in FEV1 per ATS criteria,significant improvement in FVC per ATS criteria,significant improvement in FEV1 and FVC per ATS criteria,improvement in FEV1, but not significant per ATS criteria,improvement in FVC, but not significant per ATS criteria,improvement in FEV1 and FVC, but not significant per ATS criteria,no improvement}.  Allergy  Studies: {Blank single:19197::none,deferred due to recent antihistamine use,deferred due to insurance stipulations that require a separate visit for testing,labs sent instead, }    {Blank  single:19197::Allergy  testing results were read and interpreted by myself, documented by clinical staff., }      Marty Shaggy, MD  [facility]        "

## 2024-02-05 NOTE — Patient Instructions (Addendum)
 1. Chronic rhinitis - Testing at the last visit showed: grasses, weeds, trees, indoor molds, outdoor molds, dust mites, cat, and cockroach - CONTINUE WITH the Flonase  (fluticasone ) one spray per nostril twice daily (aim for the ears). - CONTINUE WITH the Astelin  (azelastine ) one spray per nostril twice daily (aim for the ears). - CONTINUE WITH: Carbinoxamine  4mg  tablet AT NIGHT to help with drying out your nose before bed time.  - You can use an extra dose of the antihistamine, if needed, for breakthrough symptoms.  - Consider nasal saline rinses 1-2 times daily to remove allergens from the nasal cavities as well as help with mucous clearance (this is especially helpful to do before the nasal sprays are given) - We will hold off on allergy  shots as a means of long-term control. - Allergy  shots re-train and reset the immune system to ignore environmental allergens and decrease the resulting immune response to those allergens (sneezing, itchy watery eyes, runny nose, nasal congestion, etc).    - Allergy  shots improve symptoms in 75-85% of patients.  - We can always reconsider the use of allergy  shots in the future if needed.   2. Multiple myeloma in remission - I will send my note to Dr. Timmy to keep him in the loop.  3. Return in about 6 months (around 08/04/2024). You can have the follow up appointment with Dr. Iva or a Nurse Practicioner (our Nurse Practitioners are excellent and always have Physician oversight!).    Please inform us  of any Emergency Department visits, hospitalizations, or changes in symptoms. Call us  before going to the ED for breathing or allergy  symptoms since we might be able to fit you in for a sick visit. Feel free to contact us  anytime with any questions, problems, or concerns.  It was a pleasure to see you again today!  Websites that have reliable patient information: 1. American Academy of Asthma, Allergy , and Immunology: www.aaaai.org 2. Food Allergy   Research and Education (FARE): foodallergy.org 3. Mothers of Asthmatics: http://www.asthmacommunitynetwork.org 4. [employer] of Allergy , Asthma, and Immunology: www.acaai.org      Like us  on [employer] and Instagram for our latest updates!      A healthy democracy works best when [employer] participate! Make sure you are registered to vote! If you have moved or changed any of your contact information, you will need to get this updated before voting! Scan the QR codes below to learn more!

## 2024-02-06 ENCOUNTER — Encounter: Payer: Self-pay | Admitting: Allergy & Immunology

## 2024-02-06 MED ORDER — FLUTICASONE PROPIONATE 50 MCG/ACT NA SUSP: 1.0000 | Freq: Two times a day (BID) | NASAL | 5 refills | Status: AC

## 2024-02-06 MED ORDER — AZELASTINE HCL 0.1 % NA SOLN: 1.0000 | Freq: Two times a day (BID) | NASAL | 5 refills | Status: AC

## 2024-02-06 MED ORDER — MONTELUKAST SODIUM 10 MG PO TABS: 10.0000 mg | ORAL_TABLET | Freq: Every day | ORAL | 3 refills | Status: AC

## 2024-02-06 MED ORDER — LEVOCETIRIZINE DIHYDROCHLORIDE 5 MG PO TABS: 5.0000 mg | ORAL_TABLET | Freq: Every evening | ORAL | 3 refills | Status: AC

## 2024-02-07 ENCOUNTER — Ambulatory Visit: Admitting: Dermatology

## 2024-02-08 ENCOUNTER — Other Ambulatory Visit: Payer: Self-pay | Admitting: Family

## 2024-02-08 ENCOUNTER — Telehealth: Payer: Self-pay | Admitting: *Deleted

## 2024-02-08 NOTE — Telephone Encounter (Signed)
 Dr Feliberto office called to let us  know that patient had a very low B12 level in their office (value was 147).  Dr Timmy notified.  Wants to give B12 injections daily x 5 days, then once a week for a month, then monthly.  Scheduling notified.

## 2024-02-12 ENCOUNTER — Inpatient Hospital Stay: Attending: Hematology & Oncology

## 2024-02-12 VITALS — BP 157/73 | HR 66 | Temp 98.8°F | Resp 18

## 2024-02-12 DIAGNOSIS — C541 Malignant neoplasm of endometrium: Secondary | ICD-10-CM

## 2024-02-12 DIAGNOSIS — C9000 Multiple myeloma not having achieved remission: Secondary | ICD-10-CM

## 2024-02-12 DIAGNOSIS — C9001 Multiple myeloma in remission: Secondary | ICD-10-CM

## 2024-02-12 MED ORDER — CYANOCOBALAMIN 1000 MCG/ML IJ SOLN
1000.0000 ug | Freq: Once | INTRAMUSCULAR | Status: AC
  Administered 2024-02-12: 1000 ug via INTRAMUSCULAR
  Filled 2024-02-12: qty 1

## 2024-02-12 NOTE — Patient Instructions (Signed)
 Vitamin B12 Deficiency Vitamin B12 deficiency means that your body does not have enough vitamin B12. The body needs this important vitamin: To make red blood cells. To make genes (DNA). To help the nerves work. If you do not have enough vitamin B12 in your body, you can have health problems, such as not having enough red blood cells in the blood (anemia). What are the causes? Not eating enough foods that contain vitamin B12. Not being able to take in (absorb) vitamin B12 from the food that you eat. Certain diseases. A condition in which the body does not make enough of a certain protein. This results in your body not taking in enough vitamin B12. Having a surgery in which part of the stomach or small intestine is taken out. Taking medicines that make it hard for the body to take in vitamin B12. These include: Heartburn medicines. Some medicines that are used to treat diabetes. What increases the risk? Being an older adult. Eating a vegetarian or vegan diet that does not include any foods that come from animals. Not eating enough foods that contain vitamin B12 while you are pregnant. Taking certain medicines. Having alcoholism. What are the signs or symptoms? In some cases, there are no symptoms. If the condition leads to too few blood cells or nerve damage, symptoms can occur, such as: Feeling weak or tired. Not being hungry. Losing feeling (numbness) or tingling in your hands and feet. Redness and burning of the tongue. Feeling sad (depressed). Confusion or memory problems. Trouble walking. If anemia is very bad, symptoms can include: Being short of breath. Being dizzy. Having a very fast heartbeat. How is this treated? Changing the way you eat and drink, such as: Eating more foods that contain vitamin B12. Drinking little or no alcohol. Getting vitamin B12 shots. Taking vitamin B12 supplements by mouth (orally). Your doctor will tell you the dose that is best for you. Follow  these instructions at home: Eating and drinking  Eat foods that come from animals and have a lot of vitamin B12 in them. These include: Meats and poultry. This includes beef, pork, chicken, [place], and organ meats, such as liver. Seafood, such as clams, rainbow trout, salmon, tuna, and haddock. Eggs. Dairy foods such as milk, yogurt, and cheese. Eat breakfast cereals that have vitamin B12 added to them (are fortified). Check the label. The items listed above may not be a complete list of foods and beverages you can eat and drink. Contact a dietitian for more information. Alcohol use Do not drink alcohol if: Your doctor tells you not to drink. You are pregnant, may be pregnant, or are planning to become pregnant. If you drink alcohol: Limit how much you have to: 0-1 drink a day for women. 0-2 drinks a day for men. Know how much alcohol is in your drink. In the [place] one drink equals one 12 oz bottle of beer (355 mL), one 5 oz glass of wine (148 mL), or one 1 oz glass of hard liquor (44 mL). General instructions Get any vitamin B12 shots if told by your doctor. Take supplements only as told by your doctor. Follow the directions. Keep all follow-up visits. Contact a doctor if: Your symptoms come back. Your symptoms get worse or do not get better with treatment. Get help right away if: You have trouble breathing. You have a very fast heartbeat. You have chest pain. You get dizzy. You faint. These symptoms may be an emergency. Get help right away. Call 911.  Do not wait to see if the symptoms will go away. Do not drive yourself to the hospital. Summary Vitamin B12 deficiency means that your body is not getting enough of the vitamin. In some cases, there are no symptoms of this condition. Treatment may include making a change in the way you eat and drink, getting shots, or taking supplements. Eat foods that have vitamin B12 in them. This information is not intended to replace advice  given to you by your health care provider. Make sure you discuss any questions you have with your health care provider. Document Revised: 09/10/2020 Document Reviewed: 09/10/2020 Elsevier Patient Education  2024 [employer].

## 2024-02-13 ENCOUNTER — Inpatient Hospital Stay

## 2024-02-13 ENCOUNTER — Ambulatory Visit: Admitting: Dermatology

## 2024-02-13 ENCOUNTER — Encounter: Payer: Self-pay | Admitting: Dermatology

## 2024-02-13 VITALS — BP 140/72 | HR 71 | Temp 98.1°F | Resp 18

## 2024-02-13 DIAGNOSIS — C541 Malignant neoplasm of endometrium: Secondary | ICD-10-CM

## 2024-02-13 DIAGNOSIS — C9001 Multiple myeloma in remission: Secondary | ICD-10-CM

## 2024-02-13 DIAGNOSIS — L649 Androgenic alopecia, unspecified: Secondary | ICD-10-CM

## 2024-02-13 DIAGNOSIS — C9000 Multiple myeloma not having achieved remission: Secondary | ICD-10-CM

## 2024-02-13 MED ORDER — CYANOCOBALAMIN 1000 MCG/ML IJ SOLN
1000.0000 ug | Freq: Once | INTRAMUSCULAR | Status: AC
  Administered 2024-02-13: 1000 ug via INTRAMUSCULAR
  Filled 2024-02-13: qty 1

## 2024-02-13 MED ORDER — SAFETY SEAL MISCELLANEOUS MISC: 1.0000 | Freq: Every morning | 6 refills | Status: AC

## 2024-02-13 NOTE — Patient Instructions (Signed)
 Vitamin B12 Injection What is this medication? Vitamin B12 (VAHY tuh min B12) prevents and treats low vitamin B12 levels in your body. It is used in people who do not get enough vitamin B12 from their diet or when their digestive tract does not absorb enough. Vitamin B12 plays an important role in maintaining the health of your nervous system and red blood cells. This medicine may be used for other purposes; ask your health care provider or pharmacist if you have questions. COMMON BRAND NAME(S): B-12 Compliance Kit, B-12 Injection Kit, Cyomin, Dodex , LA-12, Nutri-Twelve, Physicians EZ Use B-12, Primabalt, Vitamin Deficiency Injectable System - B12 What should I tell my care team before I take this medication? They need to know if you have any of these conditions: Kidney disease Leber's disease Megaloblastic anemia An unusual or allergic reaction to cyanocobalamin , cobalt, other medications, foods, dyes, or preservatives Pregnant or trying to get pregnant Breast-feeding How should I use this medication? This medication is injected into a muscle or deeply under the skin. It is usually given in a clinic or care team's office. However, your care team may teach you how to inject yourself. Follow all instructions. Talk to your care team about the use of this medication in children. Special care may be needed. Overdosage: If you think you have taken too much of this medicine contact a poison control center or emergency room at once. NOTE: This medicine is only for you. Do not share this medicine with others. What if I miss a dose? If you are given your dose at a clinic or care team's office, call to reschedule your appointment. If you give your own injections, and you miss a dose, take it as soon as you can. If it is almost time for your next dose, take only that dose. Do not take double or extra doses. What may interact with this medication? Alcohol Colchicine This list may not describe all possible  interactions. Give your health care provider a list of all the medicines, herbs, non-prescription drugs, or dietary supplements you use. Also tell them if you smoke, drink alcohol, or use illegal drugs. Some items may interact with your medicine. What should I watch for while using this medication? Visit your care team regularly. You may need blood work done while you are taking this medication. You may need to follow a special diet. Talk to your care team. Limit your alcohol intake and avoid smoking to get the best benefit. What side effects may I notice from receiving this medication? Side effects that you should report to your care team as soon as possible: Allergic reactions--skin rash, itching, hives, swelling of the face, lips, tongue, or throat Swelling of the ankles, hands, or feet Trouble breathing Side effects that usually do not require medical attention (report to your care team if they continue or are bothersome): Diarrhea This list may not describe all possible side effects. Call your doctor for medical advice about side effects. You may report side effects to FDA at 1-800-FDA-1088. Where should I keep my medication? Keep out of the reach of children. Store at room temperature between 15 and 30 degrees C (59 and 85 degrees F). Protect from light. Throw away any unused medication after the expiration date. NOTE: This sheet is a summary. It may not cover all possible information. If you have questions about this medicine, talk to your doctor, pharmacist, or health care provider.  2024 Elsevier/Gold Standard (2020-09-28 00:00:00)

## 2024-02-13 NOTE — Progress Notes (Signed)
" ° °  Follow-Up Visit   Subjective  Emily Greer is a 74 y.o. female who presents for the following: Androgenetic Alopecia  Patient was last evaluated on 04/25/23. At this visit patient was prescribed Compound solutoin of minoxidil and finasteride to use every morning.  Recommended viviscal and collagen supplements Patient reports sxs are worse. Patient denies medication changes. Patient reports she has not been using compound medication and states that she was just putting on the drops on her scalp and not rubbing them in and the area would turn while.  Patient reports she has not been taking hair supplements or collagen supplements  The following portions of the chart were reviewed this encounter and updated as appropriate: medications, allergies, medical history  Review of Systems:  No other skin or systemic complaints except as noted in HPI or Assessment and Plan.  Objective  Well appearing patient in no apparent distress; mood and affect are within normal limits.  A focused examination was performed of the following areas: Scalp  Relevant exam findings are noted in the Assessment and Plan.    Assessment & Plan   Androgenetic alopecia Progressive hair loss, particularly at the temples and top of the scalp. Previous treatment with topical minoxidil was not applied correctly, leading to loss of progress. Current plan involves restarting treatment with minoxidil and adding collagen supplements to support hair growth.  - Restart minoxidil finasteride compound from [facility], apply every morning. - Apply minoxidil in thin layers, massaging into the scalp, focusing on the temples and top of the scalp. - Start taking vitron-c collagen supplements, mix with coffee daily. - Resent prescription for minoxidil to pharmacy for mail delivery. - Scheduled follow-up appointment in May to assess progress.   ANDROGENIC ALOPECIA   This Visit - Safety Seal Miscellaneous MISC - Apply 1  Application topically in the morning. Medication Name: Hormonic Hair Solution (Mioxidil 7%, Finasteride 0.05%)  Return in 4 months (on 06/12/2024) for androgenetic alopecia f/u.  I, Lyle Cords, as acting as a neurosurgeon for [employer], DO .   Documentation: I have reviewed the above documentation for accuracy and completeness, and I agree with the above.  Delon Lenis, DO   "

## 2024-02-13 NOTE — Patient Instructions (Addendum)
 " VISIT SUMMARY:  Emily Greer, you visited today for a follow-up on your hair loss treatment. You have a history of androgenetic alopecia and had a lapse in your treatment regimen due to personal issues. You are currently on blood pressure medication and have recently started a new medication from your allergist.  YOUR PLAN:  -ANDROGENETIC ALOPECIA:  Androgenetic alopecia is a common form of hair loss that occurs in both men and women. It is characterized by progressive hair loss, particularly at the temples and top of the scalp. Your previous treatment with topical minoxidil was not applied correctly, leading to a loss of progress.   The current plan involves restarting treatment with minoxidil and adding collagen supplements to support hair growth.   You should restart the minoxidil finasteride compound from [facility] and apply it every morning. Apply the minoxidil in thin layers, massaging it into the scalp, focusing on the temples and top of the scalp. Additionally, start taking Vital Protein collagen supplements by mixing them with your coffee daily. A prescription for minoxidil has been resent to the pharmacy for mail delivery.  INSTRUCTIONS:  Please follow up with a scheduled appointment in May to assess your progress.          Important Information  Due to recent changes in healthcare laws, you may see results of your pathology and/or laboratory studies on MyChart before the doctors have had a chance to review them. We understand that in some cases there may be results that are confusing or concerning to you. Please understand that not all results are received at the same time and often the doctors may need to interpret multiple results in order to provide you with the best plan of care or course of treatment. Therefore, we ask that you please give us  2 business days to thoroughly review all your results before contacting the office for clarification. Should we see a critical  lab result, you will be contacted sooner.   If You Need Anything After Your Visit  If you have any questions or concerns for your doctor, please call our main line at [phone] If no one answers, please leave a voicemail as directed and we will return your call as soon as possible. Messages left after 4 pm will be answered the following business day.   You may also send us  a message via MyChart. We typically respond to MyChart messages within 1-2 business days.  For prescription refills, please ask your pharmacy to contact our office. Our fax number is [phone].  If you have an urgent issue when the clinic is closed that cannot wait until the next business day, you can page your doctor at the number below.    Please note that while we do our best to be available for urgent issues outside of office hours, we are not available 24/7.   If you have an urgent issue and are unable to reach us , you may choose to seek medical care at your doctor's office, retail clinic, urgent care center, or emergency room.  If you have a medical emergency, please immediately call 911 or go to the emergency department. In the event of inclement weather, please call our main line at [phone] for an update on the status of any delays or closures.  Dermatology Medication Tips: Please keep the boxes that topical medications come in in order to help keep track of the instructions about where and how to use these. Pharmacies typically print the medication instructions only on  the boxes and not directly on the medication tubes.   If your medication is too expensive, please contact our office at [phone] or send us  a message through MyChart.   We are unable to tell what your co-pay for medications will be in advance as this is different depending on your insurance coverage. However, we may be able to find a substitute medication at lower cost or fill out paperwork to get insurance to cover a needed  medication.   If a prior authorization is required to get your medication covered by your insurance company, please allow us  1-2 business days to complete this process.  Drug prices often vary depending on where the prescription is filled and some pharmacies may offer cheaper prices.  The website www.goodrx.com contains coupons for medications through different pharmacies. The prices here do not account for what the cost may be with help from insurance (it may be cheaper with your insurance), but the website can give you the price if you did not use any insurance.  - You can print the associated coupon and take it with your prescription to the pharmacy.  - You may also stop by our office during regular business hours and pick up a GoodRx coupon card.  - If you need your prescription sent electronically to a different pharmacy, notify our office through [facility] or by phone at [phone]     "

## 2024-02-14 ENCOUNTER — Inpatient Hospital Stay

## 2024-02-14 VITALS — BP 149/67 | HR 85 | Temp 97.4°F | Resp 19

## 2024-02-14 DIAGNOSIS — C9000 Multiple myeloma not having achieved remission: Secondary | ICD-10-CM

## 2024-02-14 DIAGNOSIS — C541 Malignant neoplasm of endometrium: Secondary | ICD-10-CM

## 2024-02-14 DIAGNOSIS — C9001 Multiple myeloma in remission: Secondary | ICD-10-CM

## 2024-02-14 MED ORDER — CYANOCOBALAMIN 1000 MCG/ML IJ SOLN
1000.0000 ug | Freq: Once | INTRAMUSCULAR | Status: AC
  Administered 2024-02-14: 1000 ug via INTRAMUSCULAR
  Filled 2024-02-14: qty 1

## 2024-02-14 NOTE — Patient Instructions (Signed)
 Vitamin B12 Deficiency Vitamin B12 deficiency occurs when the body does not have enough of this important vitamin. The body needs this vitamin: To make red blood cells. To make DNA. This is the genetic material inside cells. To help the nerves work properly so they can carry messages from the brain to the body. Vitamin B12 deficiency can cause health problems, such as not having enough red blood cells in the blood (anemia). This can lead to nerve damage if untreated. What are the causes? This condition may be caused by: Not eating enough foods that contain vitamin B12. Not having enough stomach acid and digestive fluids to properly absorb vitamin B12 from the food that you eat. Having certain diseases that make it hard to absorb vitamin B12. These diseases include Crohn's disease, chronic pancreatitis, and cystic fibrosis. An autoimmune disorder in which the body does not make enough of a protein (intrinsic factor) within the stomach, resulting in not enough absorption of vitamin B12. Having a surgery in which part of the stomach or small intestine is removed. Taking certain medicines that make it hard for the body to absorb vitamin B12. These include: Heartburn medicines, such as antacids and proton pump inhibitors. Some medicines that are used to treat diabetes. What increases the risk? The following factors may make you more likely to develop a vitamin B12 deficiency: Being an older adult. Eating a vegetarian or vegan diet that does not include any foods that come from animals. Eating a poor diet while you are pregnant. Taking certain medicines. Having alcoholism. What are the signs or symptoms? In some cases, there are no symptoms of this condition. If the condition leads to anemia or nerve damage, various symptoms may occur, such as: Weakness. Tiredness (fatigue). Loss of appetite. Numbness or tingling in your hands and feet. Redness and burning of the tongue. Depression,  confusion, or memory problems. Trouble walking. If anemia is severe, symptoms can include: Shortness of breath. Dizziness. Rapid heart rate. How is this diagnosed? This condition may be diagnosed with a blood test to measure the level of vitamin B12 in your blood. You may also have other tests, including: A group of tests that measure certain characteristics of blood cells (complete blood count, CBC). A blood test to measure intrinsic factor. A procedure where a thin tube with a camera on the end is used to look into your stomach or intestines (endoscopy). Other tests may be needed to discover the cause of the deficiency. How is this treated? Treatment for this condition depends on the cause. This condition may be treated by: Changing your eating and drinking habits, such as: Eating more foods that contain vitamin B12. Drinking less alcohol or no alcohol. Getting vitamin B12 injections. Taking vitamin B12 supplements by mouth (orally). Your health care provider will tell you which dose is best for you. Follow these instructions at home: Eating and drinking  Include foods in your diet that come from animals and contain a lot of vitamin B12. These include: Meats and poultry. This includes beef, pork, chicken, [place], and organ meats, such as liver. Seafood. This includes clams, rainbow trout, salmon, tuna, and haddock. Eggs. Dairy foods such as milk, yogurt, and cheese. Eat foods that have vitamin B12 added to them (are fortified), such as ready-to-eat breakfast cereals. Check the label on the package to see if a food is fortified. The items listed above may not be a complete list of foods and beverages you can eat and drink. Contact a dietitian for  more information. Alcohol use Do not drink alcohol if: Your health care provider tells you not to drink. You are pregnant, may be pregnant, or are planning to become pregnant. If you drink alcohol: Limit how much you have to: 0-1 drink a  day for women. 0-2 drinks a day for men. Know how much alcohol is in your drink. In the [place] one drink equals one 12 oz bottle of beer (355 mL), one 5 oz glass of wine (148 mL), or one 1 oz glass of hard liquor (44 mL). General instructions Get vitamin B12 injections if told to by your health care provider. Take supplements only as told by your health care provider. Follow the directions carefully. Keep all follow-up visits. This is important. Contact a health care provider if: Your symptoms come back. Your symptoms get worse or do not improve with treatment. Get help right away: You develop shortness of breath. You have a rapid heart rate. You have chest pain. You become dizzy or you faint. These symptoms may be an emergency. Get help right away. Call 911. Do not wait to see if the symptoms will go away. Do not drive yourself to the hospital. Summary Vitamin B12 deficiency occurs when the body does not have enough of this important vitamin. Common causes include not eating enough foods that contain vitamin B12, not being able to absorb vitamin B12 from the food that you eat, having a surgery in which part of the stomach or small intestine is removed, or taking certain medicines. Eat foods that have vitamin B12 in them. Treatment may include making a change in the way you eat and drink, getting vitamin B12 injections, or taking vitamin B12 supplements. This information is not intended to replace advice given to you by your health care provider. Make sure you discuss any questions you have with your health care provider. Document Revised: 09/10/2020 Document Reviewed: 09/10/2020 Elsevier Patient Education  2024 [employer].

## 2024-02-15 ENCOUNTER — Inpatient Hospital Stay

## 2024-02-15 VITALS — BP 168/75 | HR 73 | Temp 98.4°F | Resp 18

## 2024-02-15 DIAGNOSIS — C9001 Multiple myeloma in remission: Secondary | ICD-10-CM

## 2024-02-15 DIAGNOSIS — C541 Malignant neoplasm of endometrium: Secondary | ICD-10-CM

## 2024-02-15 DIAGNOSIS — C9000 Multiple myeloma not having achieved remission: Secondary | ICD-10-CM

## 2024-02-15 MED ORDER — ALTEPLASE 2 MG IJ SOLR: 2.0000 mg | Freq: Once | INTRAMUSCULAR | Status: DC | PRN

## 2024-02-15 MED ORDER — CYANOCOBALAMIN 1000 MCG/ML IJ SOLN
1000.0000 ug | Freq: Once | INTRAMUSCULAR | Status: AC
  Administered 2024-02-15: 1000 ug via INTRAMUSCULAR

## 2024-02-15 MED ORDER — CYANOCOBALAMIN 1000 MCG/ML IJ SOLN
1000.0000 ug | Freq: Once | INTRAMUSCULAR | Status: DC
  Filled 2024-02-15: qty 1

## 2024-02-15 MED ORDER — HEPARIN SOD (PORK) LOCK FLUSH 100 UNIT/ML IV SOLN: 250.0000 [IU] | Freq: Once | INTRAVENOUS | Status: DC | PRN

## 2024-02-15 MED ORDER — SODIUM CHLORIDE 0.9% FLUSH: 3.0000 mL | Freq: Once | INTRAVENOUS | Status: DC | PRN

## 2024-02-15 NOTE — Patient Instructions (Signed)
 Vitamin B12 Deficiency Vitamin B12 deficiency occurs when the body does not have enough of this important vitamin. The body needs this vitamin: To make red blood cells. To make DNA. This is the genetic material inside cells. To help the nerves work properly so they can carry messages from the brain to the body. Vitamin B12 deficiency can cause health problems, such as not having enough red blood cells in the blood (anemia). This can lead to nerve damage if untreated. What are the causes? This condition may be caused by: Not eating enough foods that contain vitamin B12. Not having enough stomach acid and digestive fluids to properly absorb vitamin B12 from the food that you eat. Having certain diseases that make it hard to absorb vitamin B12. These diseases include Crohn's disease, chronic pancreatitis, and cystic fibrosis. An autoimmune disorder in which the body does not make enough of a protein (intrinsic factor) within the stomach, resulting in not enough absorption of vitamin B12. Having a surgery in which part of the stomach or small intestine is removed. Taking certain medicines that make it hard for the body to absorb vitamin B12. These include: Heartburn medicines, such as antacids and proton pump inhibitors. Some medicines that are used to treat diabetes. What increases the risk? The following factors may make you more likely to develop a vitamin B12 deficiency: Being an older adult. Eating a vegetarian or vegan diet that does not include any foods that come from animals. Eating a poor diet while you are pregnant. Taking certain medicines. Having alcoholism. What are the signs or symptoms? In some cases, there are no symptoms of this condition. If the condition leads to anemia or nerve damage, various symptoms may occur, such as: Weakness. Tiredness (fatigue). Loss of appetite. Numbness or tingling in your hands and feet. Redness and burning of the tongue. Depression,  confusion, or memory problems. Trouble walking. If anemia is severe, symptoms can include: Shortness of breath. Dizziness. Rapid heart rate. How is this diagnosed? This condition may be diagnosed with a blood test to measure the level of vitamin B12 in your blood. You may also have other tests, including: A group of tests that measure certain characteristics of blood cells (complete blood count, CBC). A blood test to measure intrinsic factor. A procedure where a thin tube with a camera on the end is used to look into your stomach or intestines (endoscopy). Other tests may be needed to discover the cause of the deficiency. How is this treated? Treatment for this condition depends on the cause. This condition may be treated by: Changing your eating and drinking habits, such as: Eating more foods that contain vitamin B12. Drinking less alcohol or no alcohol. Getting vitamin B12 injections. Taking vitamin B12 supplements by mouth (orally). Your health care provider will tell you which dose is best for you. Follow these instructions at home: Eating and drinking  Include foods in your diet that come from animals and contain a lot of vitamin B12. These include: Meats and poultry. This includes beef, pork, chicken, [place], and organ meats, such as liver. Seafood. This includes clams, rainbow trout, salmon, tuna, and haddock. Eggs. Dairy foods such as milk, yogurt, and cheese. Eat foods that have vitamin B12 added to them (are fortified), such as ready-to-eat breakfast cereals. Check the label on the package to see if a food is fortified. The items listed above may not be a complete list of foods and beverages you can eat and drink. Contact a dietitian for  more information. Alcohol use Do not drink alcohol if: Your health care provider tells you not to drink. You are pregnant, may be pregnant, or are planning to become pregnant. If you drink alcohol: Limit how much you have to: 0-1 drink a  day for women. 0-2 drinks a day for men. Know how much alcohol is in your drink. In the [place] one drink equals one 12 oz bottle of beer (355 mL), one 5 oz glass of wine (148 mL), or one 1 oz glass of hard liquor (44 mL). General instructions Get vitamin B12 injections if told to by your health care provider. Take supplements only as told by your health care provider. Follow the directions carefully. Keep all follow-up visits. This is important. Contact a health care provider if: Your symptoms come back. Your symptoms get worse or do not improve with treatment. Get help right away: You develop shortness of breath. You have a rapid heart rate. You have chest pain. You become dizzy or you faint. These symptoms may be an emergency. Get help right away. Call 911. Do not wait to see if the symptoms will go away. Do not drive yourself to the hospital. Summary Vitamin B12 deficiency occurs when the body does not have enough of this important vitamin. Common causes include not eating enough foods that contain vitamin B12, not being able to absorb vitamin B12 from the food that you eat, having a surgery in which part of the stomach or small intestine is removed, or taking certain medicines. Eat foods that have vitamin B12 in them. Treatment may include making a change in the way you eat and drink, getting vitamin B12 injections, or taking vitamin B12 supplements. This information is not intended to replace advice given to you by your health care provider. Make sure you discuss any questions you have with your health care provider. Document Revised: 09/10/2020 Document Reviewed: 09/10/2020 Elsevier Patient Education  2024 [employer].

## 2024-02-20 ENCOUNTER — Inpatient Hospital Stay

## 2024-02-20 VITALS — BP 148/78 | HR 97 | Temp 98.6°F | Resp 18

## 2024-02-20 DIAGNOSIS — C9000 Multiple myeloma not having achieved remission: Secondary | ICD-10-CM

## 2024-02-20 DIAGNOSIS — C9001 Multiple myeloma in remission: Secondary | ICD-10-CM

## 2024-02-20 DIAGNOSIS — C541 Malignant neoplasm of endometrium: Secondary | ICD-10-CM

## 2024-02-20 MED ORDER — CYANOCOBALAMIN 1000 MCG/ML IJ SOLN
1000.0000 ug | Freq: Once | INTRAMUSCULAR | Status: AC
  Administered 2024-02-20: 1000 ug via INTRAMUSCULAR
  Filled 2024-02-20: qty 1

## 2024-02-20 NOTE — Patient Instructions (Signed)
 Vitamin B12 Injection What is this medication? Vitamin B12 (VAHY tuh min B12) prevents and treats low vitamin B12 levels in your body. It is used in people who do not get enough vitamin B12 from their diet or when their digestive tract does not absorb enough. Vitamin B12 plays an important role in maintaining the health of your nervous system and red blood cells. This medicine may be used for other purposes; ask your health care provider or pharmacist if you have questions. COMMON BRAND NAME(S): B-12 Compliance Kit, B-12 Injection Kit, Cyomin, Dodex , LA-12, Nutri-Twelve, Physicians EZ Use B-12, Primabalt, Vitamin Deficiency Injectable System - B12 What should I tell my care team before I take this medication? They need to know if you have any of these conditions: Kidney disease Leber's disease Megaloblastic anemia An unusual or allergic reaction to cyanocobalamin , cobalt, other medications, foods, dyes, or preservatives Pregnant or trying to get pregnant Breast-feeding How should I use this medication? This medication is injected into a muscle or deeply under the skin. It is usually given in a clinic or care team's office. However, your care team may teach you how to inject yourself. Follow all instructions. Talk to your care team about the use of this medication in children. Special care may be needed. Overdosage: If you think you have taken too much of this medicine contact a poison control center or emergency room at once. NOTE: This medicine is only for you. Do not share this medicine with others. What if I miss a dose? If you are given your dose at a clinic or care team's office, call to reschedule your appointment. If you give your own injections, and you miss a dose, take it as soon as you can. If it is almost time for your next dose, take only that dose. Do not take double or extra doses. What may interact with this medication? Alcohol Colchicine This list may not describe all possible  interactions. Give your health care provider a list of all the medicines, herbs, non-prescription drugs, or dietary supplements you use. Also tell them if you smoke, drink alcohol, or use illegal drugs. Some items may interact with your medicine. What should I watch for while using this medication? Visit your care team regularly. You may need blood work done while you are taking this medication. You may need to follow a special diet. Talk to your care team. Limit your alcohol intake and avoid smoking to get the best benefit. What side effects may I notice from receiving this medication? Side effects that you should report to your care team as soon as possible: Allergic reactions--skin rash, itching, hives, swelling of the face, lips, tongue, or throat Swelling of the ankles, hands, or feet Trouble breathing Side effects that usually do not require medical attention (report to your care team if they continue or are bothersome): Diarrhea This list may not describe all possible side effects. Call your doctor for medical advice about side effects. You may report side effects to FDA at 1-800-FDA-1088. Where should I keep my medication? Keep out of the reach of children. Store at room temperature between 15 and 30 degrees C (59 and 85 degrees F). Protect from light. Throw away any unused medication after the expiration date. NOTE: This sheet is a summary. It may not cover all possible information. If you have questions about this medicine, talk to your doctor, pharmacist, or health care provider.  2024 Elsevier/Gold Standard (2020-09-28 00:00:00)

## 2024-02-26 ENCOUNTER — Encounter: Payer: Self-pay | Admitting: Internal Medicine

## 2024-02-27 ENCOUNTER — Inpatient Hospital Stay

## 2024-02-28 ENCOUNTER — Inpatient Hospital Stay

## 2024-02-28 VITALS — BP 148/85 | HR 96 | Temp 98.2°F | Resp 16 | Wt 176.0 lb

## 2024-02-28 DIAGNOSIS — C9000 Multiple myeloma not having achieved remission: Secondary | ICD-10-CM

## 2024-02-28 DIAGNOSIS — C541 Malignant neoplasm of endometrium: Secondary | ICD-10-CM

## 2024-02-28 DIAGNOSIS — C9001 Multiple myeloma in remission: Secondary | ICD-10-CM

## 2024-02-28 MED ORDER — CYANOCOBALAMIN 1000 MCG/ML IJ SOLN
1000.0000 ug | Freq: Once | INTRAMUSCULAR | Status: AC
  Administered 2024-02-28: 1000 ug via INTRAMUSCULAR
  Filled 2024-02-28: qty 1

## 2024-02-28 NOTE — Patient Instructions (Signed)
 Vitamin B12 Deficiency Vitamin B12 deficiency occurs when the body does not have enough of this important vitamin. The body needs this vitamin: To make red blood cells. To make DNA. This is the genetic material inside cells. To help the nerves work properly so they can carry messages from the brain to the body. Vitamin B12 deficiency can cause health problems, such as not having enough red blood cells in the blood (anemia). This can lead to nerve damage if untreated. What are the causes? This condition may be caused by: Not eating enough foods that contain vitamin B12. Not having enough stomach acid and digestive fluids to properly absorb vitamin B12 from the food that you eat. Having certain diseases that make it hard to absorb vitamin B12. These diseases include Crohn's disease, chronic pancreatitis, and cystic fibrosis. An autoimmune disorder in which the body does not make enough of a protein (intrinsic factor) within the stomach, resulting in not enough absorption of vitamin B12. Having a surgery in which part of the stomach or small intestine is removed. Taking certain medicines that make it hard for the body to absorb vitamin B12. These include: Heartburn medicines, such as antacids and proton pump inhibitors. Some medicines that are used to treat diabetes. What increases the risk? The following factors may make you more likely to develop a vitamin B12 deficiency: Being an older adult. Eating a vegetarian or vegan diet that does not include any foods that come from animals. Eating a poor diet while you are pregnant. Taking certain medicines. Having alcoholism. What are the signs or symptoms? In some cases, there are no symptoms of this condition. If the condition leads to anemia or nerve damage, various symptoms may occur, such as: Weakness. Tiredness (fatigue). Loss of appetite. Numbness or tingling in your hands and feet. Redness and burning of the tongue. Depression,  confusion, or memory problems. Trouble walking. If anemia is severe, symptoms can include: Shortness of breath. Dizziness. Rapid heart rate. How is this diagnosed? This condition may be diagnosed with a blood test to measure the level of vitamin B12 in your blood. You may also have other tests, including: A group of tests that measure certain characteristics of blood cells (complete blood count, CBC). A blood test to measure intrinsic factor. A procedure where a thin tube with a camera on the end is used to look into your stomach or intestines (endoscopy). Other tests may be needed to discover the cause of the deficiency. How is this treated? Treatment for this condition depends on the cause. This condition may be treated by: Changing your eating and drinking habits, such as: Eating more foods that contain vitamin B12. Drinking less alcohol or no alcohol. Getting vitamin B12 injections. Taking vitamin B12 supplements by mouth (orally). Your health care provider will tell you which dose is best for you. Follow these instructions at home: Eating and drinking  Include foods in your diet that come from animals and contain a lot of vitamin B12. These include: Meats and poultry. This includes beef, pork, chicken, [place], and organ meats, such as liver. Seafood. This includes clams, rainbow trout, salmon, tuna, and haddock. Eggs. Dairy foods such as milk, yogurt, and cheese. Eat foods that have vitamin B12 added to them (are fortified), such as ready-to-eat breakfast cereals. Check the label on the package to see if a food is fortified. The items listed above may not be a complete list of foods and beverages you can eat and drink. Contact a dietitian for  more information. Alcohol use Do not drink alcohol if: Your health care provider tells you not to drink. You are pregnant, may be pregnant, or are planning to become pregnant. If you drink alcohol: Limit how much you have to: 0-1 drink a  day for women. 0-2 drinks a day for men. Know how much alcohol is in your drink. In the [place] one drink equals one 12 oz bottle of beer (355 mL), one 5 oz glass of wine (148 mL), or one 1 oz glass of hard liquor (44 mL). General instructions Get vitamin B12 injections if told to by your health care provider. Take supplements only as told by your health care provider. Follow the directions carefully. Keep all follow-up visits. This is important. Contact a health care provider if: Your symptoms come back. Your symptoms get worse or do not improve with treatment. Get help right away: You develop shortness of breath. You have a rapid heart rate. You have chest pain. You become dizzy or you faint. These symptoms may be an emergency. Get help right away. Call 911. Do not wait to see if the symptoms will go away. Do not drive yourself to the hospital. Summary Vitamin B12 deficiency occurs when the body does not have enough of this important vitamin. Common causes include not eating enough foods that contain vitamin B12, not being able to absorb vitamin B12 from the food that you eat, having a surgery in which part of the stomach or small intestine is removed, or taking certain medicines. Eat foods that have vitamin B12 in them. Treatment may include making a change in the way you eat and drink, getting vitamin B12 injections, or taking vitamin B12 supplements. This information is not intended to replace advice given to you by your health care provider. Make sure you discuss any questions you have with your health care provider. Document Revised: 09/10/2020 Document Reviewed: 09/10/2020 Elsevier Patient Education  2024 [employer].

## 2024-03-03 ENCOUNTER — Encounter

## 2024-03-03 ENCOUNTER — Inpatient Hospital Stay

## 2024-03-03 ENCOUNTER — Inpatient Hospital Stay: Admitting: Hematology & Oncology

## 2024-03-10 ENCOUNTER — Inpatient Hospital Stay

## 2024-03-10 ENCOUNTER — Inpatient Hospital Stay: Admitting: Hematology & Oncology

## 2024-03-10 ENCOUNTER — Inpatient Hospital Stay: Attending: Hematology & Oncology

## 2024-03-11 ENCOUNTER — Ambulatory Visit: Payer: Self-pay

## 2024-06-02 ENCOUNTER — Ambulatory Visit: Admitting: Dermatology

## 2024-08-05 ENCOUNTER — Ambulatory Visit: Admitting: Allergy & Immunology

## 2024-12-17 ENCOUNTER — Ambulatory Visit: Payer: Self-pay
